# Patient Record
Sex: Female | Born: 1935 | Race: White | Hispanic: No | Marital: Married | Smoking: Former smoker
Health system: Southern US, Community
[De-identification: ages and names within clinical notes are randomized; demographics above are authoritative.]

## PROBLEM LIST (undated history)

## (undated) DIAGNOSIS — R51 Headache: Secondary | ICD-10-CM

## (undated) DIAGNOSIS — N6310 Unspecified lump in the right breast, unspecified quadrant: Secondary | ICD-10-CM

## (undated) DIAGNOSIS — Z923 Personal history of irradiation: Secondary | ICD-10-CM

## (undated) DIAGNOSIS — J189 Pneumonia, unspecified organism: Secondary | ICD-10-CM

## (undated) DIAGNOSIS — R0600 Dyspnea, unspecified: Secondary | ICD-10-CM

## (undated) DIAGNOSIS — M1990 Unspecified osteoarthritis, unspecified site: Secondary | ICD-10-CM

## (undated) DIAGNOSIS — C50919 Malignant neoplasm of unspecified site of unspecified female breast: Secondary | ICD-10-CM

## (undated) DIAGNOSIS — C801 Malignant (primary) neoplasm, unspecified: Secondary | ICD-10-CM

## (undated) DIAGNOSIS — T4145XA Adverse effect of unspecified anesthetic, initial encounter: Secondary | ICD-10-CM

## (undated) DIAGNOSIS — E119 Type 2 diabetes mellitus without complications: Secondary | ICD-10-CM

## (undated) DIAGNOSIS — I639 Cerebral infarction, unspecified: Secondary | ICD-10-CM

## (undated) DIAGNOSIS — H3530 Unspecified macular degeneration: Secondary | ICD-10-CM

## (undated) DIAGNOSIS — I5189 Other ill-defined heart diseases: Secondary | ICD-10-CM

## (undated) DIAGNOSIS — E785 Hyperlipidemia, unspecified: Secondary | ICD-10-CM

## (undated) DIAGNOSIS — I2510 Atherosclerotic heart disease of native coronary artery without angina pectoris: Principal | ICD-10-CM

## (undated) DIAGNOSIS — Z853 Personal history of malignant neoplasm of breast: Secondary | ICD-10-CM

## (undated) DIAGNOSIS — E079 Disorder of thyroid, unspecified: Secondary | ICD-10-CM

## (undated) DIAGNOSIS — T8859XA Other complications of anesthesia, initial encounter: Secondary | ICD-10-CM

## (undated) DIAGNOSIS — N184 Chronic kidney disease, stage 4 (severe): Secondary | ICD-10-CM

## (undated) DIAGNOSIS — Z9889 Other specified postprocedural states: Secondary | ICD-10-CM

## (undated) DIAGNOSIS — I209 Angina pectoris, unspecified: Secondary | ICD-10-CM

## (undated) DIAGNOSIS — G629 Polyneuropathy, unspecified: Secondary | ICD-10-CM

## (undated) DIAGNOSIS — K219 Gastro-esophageal reflux disease without esophagitis: Secondary | ICD-10-CM

## (undated) DIAGNOSIS — R112 Nausea with vomiting, unspecified: Secondary | ICD-10-CM

## (undated) DIAGNOSIS — K2970 Gastritis, unspecified, without bleeding: Secondary | ICD-10-CM

## (undated) DIAGNOSIS — I10 Essential (primary) hypertension: Secondary | ICD-10-CM

## (undated) DIAGNOSIS — Z951 Presence of aortocoronary bypass graft: Secondary | ICD-10-CM

## (undated) DIAGNOSIS — E039 Hypothyroidism, unspecified: Secondary | ICD-10-CM

## (undated) DIAGNOSIS — Z889 Allergy status to unspecified drugs, medicaments and biological substances status: Secondary | ICD-10-CM

## (undated) DIAGNOSIS — N186 End stage renal disease: Secondary | ICD-10-CM

## (undated) DIAGNOSIS — R42 Dizziness and giddiness: Secondary | ICD-10-CM

## (undated) DIAGNOSIS — N189 Chronic kidney disease, unspecified: Secondary | ICD-10-CM

## (undated) DIAGNOSIS — R0609 Other forms of dyspnea: Secondary | ICD-10-CM

## (undated) HISTORY — DX: Essential (primary) hypertension: I10

## (undated) HISTORY — PX: LAPAROSCOPIC NISSEN FUNDOPLICATION: SHX1932

## (undated) HISTORY — DX: Allergy status to unspecified drugs, medicaments and biological substances: Z88.9

## (undated) HISTORY — DX: Dyspnea, unspecified: R06.00

## (undated) HISTORY — DX: Unspecified macular degeneration: H35.30

## (undated) HISTORY — DX: Cerebral infarction, unspecified: I63.9

## (undated) HISTORY — DX: Other ill-defined heart diseases: I51.89

## (undated) HISTORY — DX: Personal history of malignant neoplasm of breast: Z85.3

## (undated) HISTORY — DX: Unspecified lump in the right breast, unspecified quadrant: N63.10

## (undated) HISTORY — PX: KNEE SURGERY: SHX244

## (undated) HISTORY — DX: Disorder of thyroid, unspecified: E07.9

## (undated) HISTORY — DX: Unspecified osteoarthritis, unspecified site: M19.90

## (undated) HISTORY — DX: Angina pectoris, unspecified: I20.9

## (undated) HISTORY — PX: OVARIAN CYST REMOVAL: SHX89

## (undated) HISTORY — PX: ABDOMINAL HYSTERECTOMY: SHX81

## (undated) HISTORY — DX: Gastro-esophageal reflux disease without esophagitis: K21.9

## (undated) HISTORY — DX: Hyperlipidemia, unspecified: E78.5

## (undated) HISTORY — DX: Other forms of dyspnea: R06.09

## (undated) HISTORY — PX: BREAST SURGERY: SHX581

## (undated) HISTORY — PX: APPENDECTOMY: SHX54

## (undated) HISTORY — DX: Dizziness and giddiness: R42

## (undated) HISTORY — DX: End stage renal disease: N18.6

## (undated) HISTORY — DX: Type 2 diabetes mellitus without complications: E11.9

## (undated) HISTORY — PX: BACK SURGERY: SHX140

## (undated) HISTORY — PX: BREAST BIOPSY: SHX20

## (undated) HISTORY — DX: Atherosclerotic heart disease of native coronary artery without angina pectoris: I25.10

---
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
## 2004-12-05 ENCOUNTER — Ambulatory Visit: Payer: Self-pay | Admitting: Internal Medicine

## 2005-05-31 ENCOUNTER — Ambulatory Visit: Payer: Self-pay | Admitting: Unknown Physician Specialty

## 2005-12-14 ENCOUNTER — Ambulatory Visit: Payer: Self-pay | Admitting: Internal Medicine

## 2007-02-19 ENCOUNTER — Ambulatory Visit: Payer: Self-pay | Admitting: Internal Medicine

## 2007-07-16 ENCOUNTER — Ambulatory Visit: Payer: Self-pay | Admitting: Internal Medicine

## 2008-03-24 ENCOUNTER — Ambulatory Visit: Payer: Self-pay | Admitting: Internal Medicine

## 2009-03-25 ENCOUNTER — Ambulatory Visit: Payer: Self-pay | Admitting: Internal Medicine

## 2010-04-05 ENCOUNTER — Ambulatory Visit: Payer: Self-pay | Admitting: Internal Medicine

## 2010-09-29 ENCOUNTER — Ambulatory Visit: Payer: Self-pay | Admitting: Unknown Physician Specialty

## 2010-10-02 DIAGNOSIS — C50919 Malignant neoplasm of unspecified site of unspecified female breast: Secondary | ICD-10-CM

## 2010-10-02 HISTORY — PX: BREAST LUMPECTOMY: SHX2

## 2010-10-02 HISTORY — DX: Malignant neoplasm of unspecified site of unspecified female breast: C50.919

## 2011-06-26 ENCOUNTER — Ambulatory Visit: Payer: Self-pay | Admitting: Internal Medicine

## 2011-06-28 ENCOUNTER — Ambulatory Visit: Payer: Self-pay | Admitting: Internal Medicine

## 2011-07-25 ENCOUNTER — Ambulatory Visit: Payer: Self-pay | Admitting: Surgery

## 2011-08-02 LAB — PATHOLOGY REPORT

## 2011-08-10 ENCOUNTER — Ambulatory Visit: Payer: Self-pay | Admitting: Surgery

## 2011-08-15 LAB — PATHOLOGY REPORT

## 2011-08-18 ENCOUNTER — Ambulatory Visit: Payer: Self-pay | Admitting: Internal Medicine

## 2011-08-19 LAB — CANCER ANTIGEN 27.29: CA 27.29: 16.1 U/mL (ref 0.0–38.6)

## 2011-09-02 ENCOUNTER — Ambulatory Visit: Payer: Self-pay | Admitting: Internal Medicine

## 2011-10-03 ENCOUNTER — Ambulatory Visit: Payer: Self-pay | Admitting: Internal Medicine

## 2011-10-26 LAB — CBC CANCER CENTER
Basophil #: 0.1 x10 3/mm (ref 0.0–0.1)
Basophil %: 0.9 %
Eosinophil #: 0.2 x10 3/mm (ref 0.0–0.7)
Eosinophil %: 3.2 %
HCT: 45.6 % (ref 35.0–47.0)
HGB: 15.5 g/dL (ref 12.0–16.0)
Lymphocyte #: 1.6 x10 3/mm (ref 1.0–3.6)
Lymphocyte %: 22.3 %
MCH: 29.4 pg (ref 26.0–34.0)
MCHC: 34 g/dL (ref 32.0–36.0)
MCV: 86 fL (ref 80–100)
Monocyte #: 0.5 x10 3/mm (ref 0.0–0.7)
Monocyte %: 6.2 %
Neutrophil #: 4.9 x10 3/mm (ref 1.4–6.5)
Neutrophil %: 67.4 %
Platelet: 274 x10 3/mm (ref 150–440)
RBC: 5.28 10*6/uL — ABNORMAL HIGH (ref 3.80–5.20)
RDW: 14.4 % (ref 11.5–14.5)
WBC: 7.3 x10 3/mm (ref 3.6–11.0)

## 2011-11-02 LAB — CBC CANCER CENTER
Basophil #: 0.1 x10 3/mm (ref 0.0–0.1)
Basophil %: 0.9 %
Eosinophil #: 0.3 x10 3/mm (ref 0.0–0.7)
Eosinophil %: 3.9 %
HCT: 43.9 % (ref 35.0–47.0)
HGB: 15 g/dL (ref 12.0–16.0)
Lymphocyte #: 2 x10 3/mm (ref 1.0–3.6)
Lymphocyte %: 27 %
MCH: 29.6 pg (ref 26.0–34.0)
MCHC: 34.1 g/dL (ref 32.0–36.0)
MCV: 87 fL (ref 80–100)
Monocyte #: 0.5 x10 3/mm (ref 0.0–0.7)
Monocyte %: 6.5 %
Neutrophil #: 4.6 x10 3/mm (ref 1.4–6.5)
Neutrophil %: 61.7 %
Platelet: 250 x10 3/mm (ref 150–440)
RBC: 5.06 10*6/uL (ref 3.80–5.20)
RDW: 14.4 % (ref 11.5–14.5)
WBC: 7.5 x10 3/mm (ref 3.6–11.0)

## 2011-11-03 ENCOUNTER — Ambulatory Visit: Payer: Self-pay | Admitting: Internal Medicine

## 2011-11-09 LAB — CBC CANCER CENTER
Basophil #: 0.1 x10 3/mm (ref 0.0–0.1)
Basophil %: 0.6 %
Eosinophil #: 0.3 x10 3/mm (ref 0.0–0.7)
Eosinophil %: 3.5 %
HCT: 44.3 % (ref 35.0–47.0)
HGB: 15.1 g/dL (ref 12.0–16.0)
Lymphocyte #: 1.7 x10 3/mm (ref 1.0–3.6)
Lymphocyte %: 22 %
MCH: 29.7 pg (ref 26.0–34.0)
MCHC: 34.1 g/dL (ref 32.0–36.0)
MCV: 87 fL (ref 80–100)
Monocyte #: 0.6 x10 3/mm (ref 0.0–0.7)
Monocyte %: 7.8 %
Neutrophil #: 5.2 x10 3/mm (ref 1.4–6.5)
Neutrophil %: 66.1 %
Platelet: 225 x10 3/mm (ref 150–440)
RBC: 5.09 10*6/uL (ref 3.80–5.20)
RDW: 14.5 % (ref 11.5–14.5)
WBC: 7.9 x10 3/mm (ref 3.6–11.0)

## 2011-11-16 LAB — CBC CANCER CENTER
Basophil #: 0.1 x10 3/mm (ref 0.0–0.1)
Basophil %: 1.2 %
Eosinophil #: 0.2 x10 3/mm (ref 0.0–0.7)
Eosinophil %: 3 %
HCT: 43.6 % (ref 35.0–47.0)
HGB: 14.8 g/dL (ref 12.0–16.0)
Lymphocyte #: 1.1 x10 3/mm (ref 1.0–3.6)
Lymphocyte %: 17.7 %
MCH: 29.4 pg (ref 26.0–34.0)
MCHC: 33.8 g/dL (ref 32.0–36.0)
MCV: 87 fL (ref 80–100)
Monocyte #: 0.4 x10 3/mm (ref 0.0–0.7)
Monocyte %: 7.1 %
Neutrophil #: 4.5 x10 3/mm (ref 1.4–6.5)
Neutrophil %: 71 %
Platelet: 222 x10 3/mm (ref 150–440)
RBC: 5.02 10*6/uL (ref 3.80–5.20)
RDW: 14.7 % — ABNORMAL HIGH (ref 11.5–14.5)
WBC: 6.3 x10 3/mm (ref 3.6–11.0)

## 2011-11-23 LAB — CBC CANCER CENTER
Basophil #: 0 x10 3/mm (ref 0.0–0.1)
Basophil %: 0.6 %
Eosinophil #: 0.2 x10 3/mm (ref 0.0–0.7)
Eosinophil %: 3.3 %
HCT: 44.4 % (ref 35.0–47.0)
HGB: 15 g/dL (ref 12.0–16.0)
Lymphocyte #: 1 x10 3/mm (ref 1.0–3.6)
Lymphocyte %: 17.3 %
MCH: 29.5 pg (ref 26.0–34.0)
MCHC: 33.8 g/dL (ref 32.0–36.0)
MCV: 87 fL (ref 80–100)
Monocyte #: 0.3 x10 3/mm (ref 0.0–0.7)
Monocyte %: 6.1 %
Neutrophil #: 4 x10 3/mm (ref 1.4–6.5)
Neutrophil %: 72.7 %
Platelet: 196 x10 3/mm (ref 150–440)
RBC: 5.08 10*6/uL (ref 3.80–5.20)
RDW: 14.9 % — ABNORMAL HIGH (ref 11.5–14.5)
WBC: 5.6 x10 3/mm (ref 3.6–11.0)

## 2011-11-30 LAB — CBC CANCER CENTER
Basophil #: 0 x10 3/mm (ref 0.0–0.1)
Basophil %: 0.8 %
Eosinophil #: 0.2 x10 3/mm (ref 0.0–0.7)
Eosinophil %: 2.9 %
HCT: 44.4 % (ref 35.0–47.0)
HGB: 15.1 g/dL (ref 12.0–16.0)
Lymphocyte #: 0.9 x10 3/mm — ABNORMAL LOW (ref 1.0–3.6)
Lymphocyte %: 16 %
MCH: 29.6 pg (ref 26.0–34.0)
MCHC: 34 g/dL (ref 32.0–36.0)
MCV: 87 fL (ref 80–100)
Monocyte #: 0.4 x10 3/mm (ref 0.0–0.7)
Monocyte %: 6.6 %
Neutrophil #: 4.4 x10 3/mm (ref 1.4–6.5)
Neutrophil %: 73.7 %
Platelet: 194 x10 3/mm (ref 150–440)
RBC: 5.1 10*6/uL (ref 3.80–5.20)
RDW: 14.8 % — ABNORMAL HIGH (ref 11.5–14.5)
WBC: 5.9 x10 3/mm (ref 3.6–11.0)

## 2011-12-01 ENCOUNTER — Ambulatory Visit: Payer: Self-pay | Admitting: Internal Medicine

## 2011-12-12 LAB — COMPREHENSIVE METABOLIC PANEL
Albumin: 3.3 g/dL — ABNORMAL LOW (ref 3.4–5.0)
Alkaline Phosphatase: 108 U/L (ref 50–136)
Anion Gap: 9 (ref 7–16)
BUN: 18 mg/dL (ref 7–18)
Bilirubin,Total: 0.2 mg/dL (ref 0.2–1.0)
Calcium, Total: 8.5 mg/dL (ref 8.5–10.1)
Chloride: 100 mmol/L (ref 98–107)
Co2: 28 mmol/L (ref 21–32)
Creatinine: 1.08 mg/dL (ref 0.60–1.30)
EGFR (African American): >60
EGFR (Non-African Amer.): 53 — ABNORMAL LOW
Glucose: 191 mg/dL — ABNORMAL HIGH (ref 65–99)
Osmolality: 281 (ref 275–301)
Potassium: 4.1 mmol/L (ref 3.5–5.1)
SGOT(AST): 12 U/L — ABNORMAL LOW (ref 15–37)
SGPT (ALT): 20 U/L
Sodium: 137 mmol/L (ref 136–145)
Total Protein: 7.9 g/dL (ref 6.4–8.2)

## 2011-12-12 LAB — CBC CANCER CENTER
Basophil #: 0 x10 3/mm (ref 0.0–0.1)
Basophil %: 0.3 %
Eosinophil #: 0.2 x10 3/mm (ref 0.0–0.7)
Eosinophil %: 2.5 %
HCT: 41.4 % (ref 35.0–47.0)
HGB: 14.4 g/dL (ref 12.0–16.0)
Lymphocyte #: 1 x10 3/mm (ref 1.0–3.6)
Lymphocyte %: 11.1 %
MCH: 30.2 pg (ref 26.0–34.0)
MCHC: 34.8 g/dL (ref 32.0–36.0)
MCV: 87 fL (ref 80–100)
Monocyte #: 0.6 x10 3/mm (ref 0.0–0.7)
Monocyte %: 6.3 %
Neutrophil #: 7.2 x10 3/mm — ABNORMAL HIGH (ref 1.4–6.5)
Neutrophil %: 79.8 %
Platelet: 239 x10 3/mm (ref 150–440)
RBC: 4.77 10*6/uL (ref 3.80–5.20)
RDW: 14.9 % — ABNORMAL HIGH (ref 11.5–14.5)
WBC: 9 x10 3/mm (ref 3.6–11.0)

## 2011-12-13 LAB — CANCER ANTIGEN 27.29: CA 27.29: 18.5 U/mL (ref 0.0–38.6)

## 2012-01-01 ENCOUNTER — Ambulatory Visit: Payer: Self-pay | Admitting: Internal Medicine

## 2012-01-31 ENCOUNTER — Ambulatory Visit: Payer: Self-pay | Admitting: Internal Medicine

## 2012-03-14 ENCOUNTER — Ambulatory Visit: Payer: Self-pay | Admitting: Oncology

## 2012-04-01 ENCOUNTER — Ambulatory Visit: Payer: Self-pay | Admitting: Oncology

## 2012-05-02 ENCOUNTER — Ambulatory Visit: Payer: Self-pay | Admitting: Oncology

## 2012-06-20 ENCOUNTER — Ambulatory Visit: Payer: Self-pay | Admitting: Oncology

## 2012-06-27 ENCOUNTER — Ambulatory Visit: Payer: Self-pay | Admitting: Internal Medicine

## 2012-07-02 ENCOUNTER — Ambulatory Visit: Payer: Self-pay | Admitting: Oncology

## 2012-08-19 ENCOUNTER — Ambulatory Visit (INDEPENDENT_AMBULATORY_CARE_PROVIDER_SITE_OTHER): Payer: Medicare Other | Admitting: Cardiovascular Disease

## 2012-08-19 ENCOUNTER — Encounter: Payer: Self-pay | Admitting: Cardiovascular Disease

## 2012-08-19 VITALS — BP 140/80 | HR 74 | Ht 65.0 in | Wt 220.8 lb

## 2012-08-19 DIAGNOSIS — R0602 Shortness of breath: Secondary | ICD-10-CM

## 2012-08-19 DIAGNOSIS — E785 Hyperlipidemia, unspecified: Secondary | ICD-10-CM

## 2012-08-19 DIAGNOSIS — I10 Essential (primary) hypertension: Secondary | ICD-10-CM

## 2012-08-19 DIAGNOSIS — K219 Gastro-esophageal reflux disease without esophagitis: Secondary | ICD-10-CM | POA: Insufficient documentation

## 2012-08-19 DIAGNOSIS — E119 Type 2 diabetes mellitus without complications: Secondary | ICD-10-CM

## 2012-08-19 DIAGNOSIS — C50919 Malignant neoplasm of unspecified site of unspecified female breast: Secondary | ICD-10-CM

## 2012-08-19 DIAGNOSIS — R070 Pain in throat: Secondary | ICD-10-CM

## 2012-08-19 MED ORDER — PREDNISONE 20 MG PO TABS: ORAL_TABLET | ORAL | Status: DC

## 2012-08-19 MED ORDER — NITROGLYCERIN 0.4 MG SL SUBL: 0.4000 mg | SUBLINGUAL_TABLET | SUBLINGUAL | Status: DC | PRN

## 2012-08-19 NOTE — Assessment & Plan Note (Signed)
Previous radiation on the right. No recurrence per the patient

## 2012-08-19 NOTE — Assessment & Plan Note (Addendum)
Exertional throat pain, getting worse over the past year. Very concerning for angina also in light of her risk factors including poorly controlled diabetes and hyperlipidemia. She has strong family history of bypass surgery/coronary artery disease.   We have discussed the various options with her including medical management, noninvasive stress testing, cardiac catheterization. After discussion with her and her husband, she would like to proceed with cardiac catheterization. We have discussed the risk and benefit of the procedure including heart attack, stroke, death. She is willing to proceed with the procedure and this will be scheduled later this week.  We will prescribe nitroglycerin for her to take sublingual if she has recurrent pain

## 2012-08-19 NOTE — Assessment & Plan Note (Signed)
Mild shortness of breath with exertion. Uncertain if this is cardiac etiology or deconditioning. Cardiac cath pending.

## 2012-08-19 NOTE — Progress Notes (Signed)
Patient ID: [name], female    DOB: 1934/09/20, 78 y.o.   MRN: [id number]  HPI Comments: [name] is a very pleasant 79 year old woman with history of hyperlipidemia, diabetes, previous esophageal surgery, breast cancer, remote smoking history who quit in the 1990s, radiation on the right and 39 treatments with no chemotherapy, hypertension who presents by referral from Dr. Tami Ribas for throat tightness with exertion. She is a patient of Dr. Georgie Chard.  She reports that for over one year, she has had worsening throat tightness with exertion. She has symptoms of throat pain when she pushed mows, does any housework that is strenuous. If she sits down, her symptoms will recover within 15-20 minutes. She occasionally has symptoms at rest though typically with exertion. When she exerts herself, she feels very hot. She denies any problems with swallowing when she has these symptoms. She has not tried nitroglycerin for her symptoms. She denies any lightheadedness, dizziness, edema. Typically with exertion, she'll have to sit down secondary to her symptoms. No radiating pain to her arm or back.  She reports being unable to take a cholesterol pill. Total cholesterol 244 Hemoglobin A1c 7.8 She reports having evaluation by Dr. Tami Ribas with no ear nose throat etiology of her throat pain. She does report having reaction to dye  EKG shows normal sinus rhythm with rate 73 beats per minute, poor R-wave progression through the precordial leads, unable to exclude old anterior infarct, left axis deviation   Outpatient Encounter Prescriptions as of 08/19/2012  Medication Sig Dispense Refill  . aspirin 81 MG tablet Take 81 mg by mouth daily.      . cloNIDine (CATAPRES) 0.2 MG tablet Take 0.2 mg by mouth 2 (two) times daily.      Marland Kitchen letrozole (FEMARA) 2.5 MG tablet Take 2.5 mg by mouth daily.      Marland Kitchen levothyroxine (SYNTHROID, LEVOTHROID) 125 MCG tablet Take 125 mcg by mouth daily.      . nitroGLYCERIN  (NITROSTAT) 0.4 MG SL tablet Place 1 tablet (0.4 mg total) under the tongue every 5 (five) minutes as needed for chest pain.  25 tablet  3    Review of Systems  Constitutional: Negative.   HENT: Negative.   Eyes: Negative.   Respiratory: Negative.   Cardiovascular: Positive for chest pain.       Throat tightness with exertion  Gastrointestinal: Negative.   Musculoskeletal: Negative.   Skin: Negative.   Neurological: Negative.   Hematological: Negative.   Psychiatric/Behavioral: Negative.   All other systems reviewed and are negative.    BP 140/80  Pulse 74  Ht 5\' 5"  (1.651 m)  Wt 220 lb 12 oz (100.132 kg)  BMI 36.73 kg/m2  Physical Exam  Nursing note and vitals reviewed. Constitutional: She is oriented to person, place, and time. She appears well-developed and well-nourished.       Obese  HENT:  Head: Normocephalic.  Nose: Nose normal.  Mouth/Throat: Oropharynx is clear and moist.  Eyes: Conjunctivae normal are normal. Pupils are equal, round, and reactive to light.  Neck: Normal range of motion. Neck supple. No JVD present.  Cardiovascular: Normal rate, regular rhythm, S1 normal, S2 normal, normal heart sounds and intact distal pulses.  Exam reveals no gallop and no friction rub.   No murmur heard. Pulmonary/Chest: Effort normal and breath sounds normal. No respiratory distress. She has no wheezes. She has no rales. She exhibits no tenderness.  Abdominal: Soft. Bowel sounds are normal. She exhibits no distension. There is  no tenderness.  Musculoskeletal: Normal range of motion. She exhibits no edema and no tenderness.  Lymphadenopathy:    She has no cervical adenopathy.  Neurological: She is alert and oriented to person, place, and time. Coordination normal.  Skin: Skin is warm and dry. No rash noted. No erythema.  Psychiatric: She has a normal mood and affect. Her behavior is normal. Judgment and thought content normal.         Assessment and Plan

## 2012-08-19 NOTE — Assessment & Plan Note (Signed)
Blood pressure is well controlled on today's visit. No changes made to the medications. 

## 2012-08-19 NOTE — Patient Instructions (Addendum)
We will schedule you for a cardiac cath on Thursday at 8 AM We will check kidney function and chest Xray today in preparation  No food the morning of the procedure Please take your medications the morning of the procedure  Please call us if you have new issues that need to be addressed before your next appt.

## 2012-08-19 NOTE — Assessment & Plan Note (Signed)
We have encouraged continued exercise, careful diet management in an effort to lose weight. 

## 2012-08-19 NOTE — Assessment & Plan Note (Signed)
Total cholesterol 244. She does not want to take a cholesterol medication. She has previously tried red yeast rice with myalgias

## 2012-08-20 ENCOUNTER — Telehealth: Payer: Self-pay

## 2012-08-20 LAB — BASIC METABOLIC PANEL
BUN/Creatinine Ratio: 16 (ref 11–26)
BUN: 14 mg/dL (ref 8–27)
CO2: 24 mmol/L (ref 19–28)
Calcium: 9.2 mg/dL (ref 8.6–10.2)
Chloride: 98 mmol/L (ref 97–108)
Creatinine, Ser: 0.89 mg/dL (ref 0.57–1.00)
GFR calc Af Amer: 73 mL/min/{1.73_m2} (ref >59)
GFR calc non Af Amer: 63 mL/min/{1.73_m2} (ref >59)
Glucose: 145 mg/dL — ABNORMAL HIGH (ref 65–99)
Potassium: 4.6 mmol/L (ref 3.5–5.2)
Sodium: 136 mmol/L (ref 134–144)

## 2012-08-20 LAB — PROTIME-INR
INR: 1 (ref 0.8–1.2)
Prothrombin Time: 10.2 s (ref 9.1–12.0)

## 2012-08-20 LAB — CBC WITH DIFFERENTIAL
Basophils Absolute: 0.1 10*3/uL (ref 0.0–0.2)
Basos: 1 % (ref 0–3)
Eos: 3 % (ref 0–5)
Eosinophils Absolute: 0.2 10*3/uL (ref 0.0–0.4)
HCT: 40.7 % (ref 34.0–46.6)
Hemoglobin: 14.5 g/dL (ref 11.1–15.9)
Immature Grans (Abs): 0 10*3/uL (ref 0.0–0.1)
Immature Granulocytes: 0 % (ref 0–2)
Lymphocytes Absolute: 2 10*3/uL (ref 0.7–3.1)
Lymphs: 33 % (ref 14–46)
MCH: 30.8 pg (ref 26.6–33.0)
MCHC: 35.6 g/dL (ref 31.5–35.7)
MCV: 86 fL (ref 79–97)
Monocytes Absolute: 0.4 10*3/uL (ref 0.1–0.9)
Monocytes: 7 % (ref 4–12)
Neutrophils Absolute: 3.5 10*3/uL (ref 1.4–7.0)
Neutrophils Relative %: 56 % (ref 40–74)
Platelets: 198 10*3/uL (ref 155–379)
RBC: 4.71 x10E6/uL (ref 3.77–5.28)
RDW: 14.6 % (ref 12.3–15.4)
WBC: 6.2 10*3/uL (ref 3.4–10.8)

## 2012-08-20 MED ORDER — PREDNISONE 20 MG PO TABS: ORAL_TABLET | ORAL | Status: DC

## 2012-08-20 NOTE — Telephone Encounter (Signed)
Resent Prednisone due to Rx defaulting to print when sent in on 08/19/2012.

## 2012-08-20 NOTE — Telephone Encounter (Signed)
Will have Dr. Rockey Situ review and address

## 2012-08-20 NOTE — Telephone Encounter (Signed)
[id number] Canary Brim         ----- Message -----   From: Lillia Pauls   Sent: 08/20/2012 10:01 AM   To: Minna Merritts, MD, Minus Liberty, RN   Subject: LHC       Dr. Rockey Situ,      LHC was denied by [facility] MD. I faxed all clinicals prior to their denial. I have not resubmitted anything additional for a "Restart".       Reason for denial:      Test has been requested to look for a possible narrowing in the arteries to the heart (CAD) causing heart related symptoms such as chest pain. No prior tests have shown that there is a narrowing of the arteries to the heart. Based on the description of the current symptoms, age and sex there is a low to intermediate likelihood of having a problem with the arteries to the heart (CAD). No "imaging stress test" (pictures of the heart after walking on a treadmill) has been done. This test would be needed if narrowing of the heart arteries was shown on an "imaging stress test". This test would be needed if based on the description of the current symptoms, age, and sex there is a high likelihood of having a problem with the arteries to the heart (CAD).      They may let you call and discuss without submitting any additional info and doing a "RESTART". Let me know what you need me to do.      Denied [id number]  - Please call [phone] Opt#[id number]      Thanks,      Charmaine               Forwarded by:     Lillia Pauls Date: 08/20/2012

## 2012-08-21 ENCOUNTER — Telehealth: Payer: Self-pay

## 2012-08-21 NOTE — Telephone Encounter (Signed)
LHC APPROVED.

## 2012-08-21 NOTE — Telephone Encounter (Signed)
Dr. Rockey Situ says he will call insur company # provided for "peer-peer" review We will call pt with decision

## 2012-08-21 NOTE — Telephone Encounter (Signed)
Dr. Rockey Situ discussed case with Dr. Tenna Delaine at [facility] peer review Dr. Tenna Delaine informed Dr. Rockey Situ she will send appeal request and we should be receiving a fax within 1-2 houirs with prior auth # attached precert team made aware in [place] Dr. Rockey Situ asked if we should postpone cath and was told by Dr. Tenna Delaine to keep as scheduled Pt informed Understanding verb

## 2012-08-21 NOTE — Telephone Encounter (Signed)
I discussed with Dr. Rockey Situ the issue of pt's insurance denying coverage for [facility] He asks that I call pt to tell her we are having to perform a stress test prior to heart cath, per insurance  I called pt who says she would like Dr. Rockey Situ to try to "fight" this and call her back as soon as we have an answer I will discuss again with Dr. Rockey Situ and will have him call # provided

## 2012-08-22 ENCOUNTER — Ambulatory Visit: Payer: Self-pay | Admitting: Cardiovascular Disease

## 2012-08-22 ENCOUNTER — Telehealth: Payer: Self-pay

## 2012-08-22 DIAGNOSIS — I2510 Atherosclerotic heart disease of native coronary artery without angina pectoris: Secondary | ICD-10-CM

## 2012-08-22 HISTORY — DX: Atherosclerotic heart disease of native coronary artery without angina pectoris: I25.10

## 2012-08-22 NOTE — Telephone Encounter (Signed)
"  Refer pt to [facility] to discuss possible CABG" VO Dr. Mills Koller, RN Referral form faxed to [place]

## 2012-08-22 NOTE — Telephone Encounter (Signed)
Pt informed that I have faxed referral to [facility] and am awaiting appt Understanding verb I will call [facility] back at 4 pm to see if appt has been made

## 2012-08-22 NOTE — Telephone Encounter (Signed)
appt made with Dr. Roxy Manns at [place] 08/26/12 at 1000 Pt informed Understanding verb

## 2012-08-23 ENCOUNTER — Other Ambulatory Visit: Payer: Self-pay | Admitting: Cardiovascular Disease

## 2012-08-23 MED ORDER — METOPROLOL TARTRATE 25 MG PO TABS: 25.0000 mg | ORAL_TABLET | Freq: Two times a day (BID) | ORAL | Status: DC

## 2012-08-26 ENCOUNTER — Other Ambulatory Visit: Payer: Self-pay | Admitting: Thoracic Surgery (Cardiothoracic Vascular Surgery)

## 2012-08-26 ENCOUNTER — Institutional Professional Consult (permissible substitution) (INDEPENDENT_AMBULATORY_CARE_PROVIDER_SITE_OTHER): Payer: Medicare Other | Admitting: Thoracic Surgery (Cardiothoracic Vascular Surgery)

## 2012-08-26 ENCOUNTER — Other Ambulatory Visit: Payer: Self-pay | Admitting: *Deleted

## 2012-08-26 ENCOUNTER — Encounter: Payer: Self-pay | Admitting: Thoracic Surgery (Cardiothoracic Vascular Surgery)

## 2012-08-26 VITALS — BP 178/89 | HR 77 | Resp 20 | Ht 65.0 in | Wt 220.0 lb

## 2012-08-26 DIAGNOSIS — I2510 Atherosclerotic heart disease of native coronary artery without angina pectoris: Secondary | ICD-10-CM

## 2012-08-26 DIAGNOSIS — I209 Angina pectoris, unspecified: Secondary | ICD-10-CM

## 2012-08-26 HISTORY — DX: Angina pectoris, unspecified: I20.9

## 2012-08-26 NOTE — Patient Instructions (Signed)
Use nitroglycerin tablets as directed for any chest pain.  Avoid all strenuous or stressful activity.  Report any significant changes to our office.

## 2012-08-26 NOTE — Progress Notes (Signed)
[address]            [place] [address]          [phone]     CARDIOTHORACIC SURGERY CONSULTATION REPORT  Referring Provider is Rockey Situ, Kathlene November, MD PCP is Idelle Crouch, MD  Chief Complaint  Patient presents with  . Coronary Artery Disease    Referral from Dr Rockey Situ for surgical eval on CAD, Cardiac Cath 08/22/12    HPI:  Patient is a 78 year old female from [place] no previous history of coronary artery disease but risk factors notable for history of hypertension, type 2 diabetes mellitus, hyperlipidemia, a family history of coronary artery disease and a remote history of tobacco use. The patient describes a greater than one-year history of tightness in her throat which is brought on with physical activity and relieved by rest. She states that she first experienced these symptoms more than a year ago and typically occur when the patient is moaning her longer doing something reasonably strenuous. 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 She did not undergo adjuvant chemotherapy.  During that time she was less active physically and she states that she hasn't really noticed her problems with tightness in her throat. However, this past summer she seemed to get her energy back in with added physical activity she began to experience similar symptoms but also became associated with exertional shortness of breath. He was seen by her otolaryngologist and subsequently referred to Dr. Rockey Situ because of concerns that her symptoms might represent angina pectoris. Over the past few months her symptoms have seemed to get some worsening she occasionally gets mild tightness in her throat using when she has not physically active, although for the most part her symptoms occur only with physical activity. Symptoms are usually relieved within 15-20 minutes of  rest, and she has never had any particularly prolonged episodes of discomfort. She has not had any pain in her chest, arm, nor back.  She underwent cardiac catheterization last week and was found to have 70% stenosis of the left main coronary artery with severe three-vessel coronary artery disease and preserved left ventricular function. The patient has now been referred for possible elective surgical revascularization.  Past Medical History  Diagnosis Date  . Breast mass, right   . Vertigo   . Hypertension   . Thyroid disease   . History of breast cancer     39 treatments of radiation. Negative chemo.  . Diabetes   . GERD (gastroesophageal reflux disease)   . Hyperlipemia   . History of seasonal allergies   . Arthritis   . Coronary artery disease 08/22/2012  . Angina pectoris 08/26/2012    Past Surgical History  Procedure Date  . Abdominal hysterectomy   . Ovarian cyst removal   . Back surgery   . Appendectomy   . Breast surgery   . Laparoscopic nissen fundoplication     Family History  Problem Relation Age of Onset  . Heart disease Brother     CABG & stents  . Hyperlipidemia Brother   . Hypertension Brother   . Cancer Father   . Heart disease Mother     History   Social History  . Marital Status: Married    Spouse Name: N/A    Number of Children: 0  .  Years of Education: N/A   Occupational History  . retired    Social History Main Topics  . Smoking status: Former Smoker    Types: Cigarettes    Quit date: 11/13/1988  . Smokeless tobacco: Not on file  . Alcohol Use: No  . Drug Use: No  . Sexually Active: Not on file   Other Topics Concern  . Not on file   Social History Narrative  . No narrative on file    Current Outpatient Prescriptions  Medication Sig Dispense Refill  . aspirin 325 MG EC tablet Take 325 mg by mouth daily.      . cloNIDine (CATAPRES) 0.2 MG tablet Take 0.2 mg by mouth 2 (two) times daily.      Marland Kitchen letrozole (FEMARA) 2.5 MG tablet  Take 2.5 mg by mouth daily.      Marland Kitchen levothyroxine (SYNTHROID, LEVOTHROID) 125 MCG tablet Take 125 mcg by mouth daily.      . metoprolol tartrate (LOPRESSOR) 25 MG tablet Take 1 tablet (25 mg total) by mouth 2 (two) times daily.  60 tablet  3  . nitroGLYCERIN (NITROSTAT) 0.4 MG SL tablet Place 1 tablet (0.4 mg total) under the tongue every 5 (five) minutes as needed for chest pain.  25 tablet  3  . predniSONE (DELTASONE) 20 MG tablet Take 3 tablets (60 mg) night before procedure Take 3 tablets (60 mg) morning of procedure  6 tablet  0    Allergies  Allergen Reactions  . Ciprocinonide (Fluocinolone)     Feels crazy  . Darvocet (Propoxyphene-Acetaminophen)     Difficulty breathing and swallowing  . Ivp Dye (Iodinated Diagnostic Agents)     Nausea & vomiting  . Statins     Muscle aches      Review of Systems:   General:  normal appetite, decreased energy, no weight gain, no weight loss, no fever  Cardiac:  no chest pain with exertion, no chest pain at rest, + SOB with moderate exertion, no resting SOB, no PND, no orthopnea, no palpitations, no arrhythmia, no atrial fibrillation, no LE edema, no dizzy spells, no syncope  Respiratory:  + exertional shortness of breath, no home oxygen, no productive cough, + dry cough, no bronchitis, no wheezing, no hemoptysis, no asthma, no pain with inspiration or cough, no sleep apnea, no CPAP at night  GI:   no difficulty swallowing, remote h/o reflux, no frequent heartburn, no hiatal hernia, no abdominal pain, no constipation, no diarrhea, no hematochezia, no hematemesis, no melena  GU:   no dysuria,  no frequency, no urinary tract infection, no hematuria, no kidney stones, no kidney disease  Vascular:  no pain suggestive of claudication, no pain in feet, no leg cramps, no varicose veins, no DVT, no non-healing foot ulcer  Neuro:   no stroke, no TIA's, no seizures, no headaches, no temporary blindness one eye,  no slurred speech, no peripheral neuropathy,  no chronic pain, no instability of gait, no memory/cognitive dysfunction  Musculoskeletal: + arthritis, no joint swelling, no myalgias, mild difficulty walking, normal mobility   Skin:   no rash, no itching, no skin infections, no pressure sores or ulcerations  Psych:   no anxiety, no depression, no nervousness, no unusual recent stress  Eyes:   no blurry vision, no floaters, no recent vision changes,  wears glasses or contacts  ENT:   no hearing loss, no loose or painful teeth, no dentures  Hematologic:  no easy bruising, no abnormal bleeding, no clotting disorder,  no frequent epistaxis  Endocrine:  + diabetes, does not check CBG's at home     Physical Exam:   BP 178/89  Pulse 77  Resp 20  Ht 5\' 5"  (1.651 m)  Wt 220 lb (99.791 kg)  BMI 36.61 kg/m2  SpO2 94%  General:  Obese but o/w  well-appearing  HEENT:  Unremarkable   Neck:   no JVD, no bruits, no adenopathy   Chest:   clear to auscultation, symmetrical breath sounds, no wheezes, no rhonchi   CV:   RRR, no murmur   Abdomen:  soft, non-tender, no masses   Extremities:  warm, well-perfused, pulses diminished, no LE edema  Rectal/GU  Deferred  Neuro:   Grossly non-focal and symmetrical throughout  Skin:   Clean and dry, no rashes, no breakdown   Diagnostic Tests:  Cardiac catheterization performed 08/22/2012 at [facility] is directly reviewed. The patient has right dominant coronary circulation with diffuse coronary artery disease suggestive of underlying diabetes mellitus. There is tubular 70% stenosis of the distal left main coronary artery.  There is 70-80% proximal stenosis of the left anterior ascending coronary artery. There is a medium to large size bifurcating diagonal branch that has diffuse disease is in both branches including 90% stenosis of the lateral subbranch. There is 90% ostial stenosis of left circumflex coronary artery. His 50% proximal stenosis of the right coronary artery with long segment  tubular 60-70% proximal stenosis of the posterior descending coronary artery. Left ventricular function appears normal with ejection fraction estimated greater than 60%.   Impression:  Left main disease with severe three-vessel coronary artery disease and preserved left ventricular systolic function. The patient presents with symptoms of exertional tightness in her throat consistent with angina pectoris. I agree that she would best be treated with surgical revascularization. Risks associated with surgery will be slightly elevated because of the patient's advanced age, obesity, and type 2 diabetes mellitus.   Plan:  I spent in axis of 60 minutes with the patient and her husband in the office today discussing her current condition and alternative treatment strategies for the future. We discussed at length the indications, risks, and potential benefits of coronary artery bypass grafting. The patient and her husband understand and accept all potential associated risks of surgery including but not limited to risk of death, stroke, myocardial infarction, congestive heart failure, respiratory failure, renal failure, pneumonia, bleeding requiring blood transfusion, arrhythmia, infection, and late recurrence of symptomatic coronary artery disease. All of her questions been addressed. We tentatively plan to proceed with surgery on December 5.    Valentina Gu. Roxy Manns, MD 08/26/2012 2:53 PM

## 2012-08-27 ENCOUNTER — Encounter (HOSPITAL_COMMUNITY): Payer: Self-pay | Admitting: Pharmacy Technician

## 2012-09-02 DIAGNOSIS — Z01810 Encounter for preprocedural cardiovascular examination: Secondary | ICD-10-CM

## 2012-09-03 ENCOUNTER — Ambulatory Visit (HOSPITAL_COMMUNITY)
Admission: RE | Admit: 2012-09-03 | Discharge: 2012-09-03 | Disposition: A | Discharge status: Home / Self Care | Payer: Medicare Other | Source: Ambulatory Visit | Attending: Thoracic Surgery (Cardiothoracic Vascular Surgery) | Admitting: Thoracic Surgery (Cardiothoracic Vascular Surgery)

## 2012-09-03 ENCOUNTER — Inpatient Hospital Stay (HOSPITAL_COMMUNITY)
Admission: RE | Admit: 2012-09-03 | Discharge: 2012-09-03 | Disposition: A | Discharge status: Home / Self Care | Payer: Medicare Other | Source: Ambulatory Visit | Attending: Thoracic Surgery (Cardiothoracic Vascular Surgery) | Admitting: Thoracic Surgery (Cardiothoracic Vascular Surgery)

## 2012-09-03 ENCOUNTER — Encounter (HOSPITAL_COMMUNITY): Payer: Self-pay

## 2012-09-03 ENCOUNTER — Encounter (HOSPITAL_COMMUNITY)
Admission: RE | Admit: 2012-09-03 | Discharge: 2012-09-03 | Disposition: A | Discharge status: Home / Self Care | Payer: Medicare Other | Source: Ambulatory Visit | Attending: Thoracic Surgery (Cardiothoracic Vascular Surgery) | Admitting: Thoracic Surgery (Cardiothoracic Vascular Surgery)

## 2012-09-03 VITALS — BP 108/64 | HR 79 | Temp 97.9°F | Resp 20 | Ht 65.0 in | Wt 218.0 lb

## 2012-09-03 DIAGNOSIS — I2510 Atherosclerotic heart disease of native coronary artery without angina pectoris: Secondary | ICD-10-CM

## 2012-09-03 DIAGNOSIS — C50919 Malignant neoplasm of unspecified site of unspecified female breast: Secondary | ICD-10-CM

## 2012-09-03 DIAGNOSIS — E119 Type 2 diabetes mellitus without complications: Secondary | ICD-10-CM

## 2012-09-03 DIAGNOSIS — I10 Essential (primary) hypertension: Secondary | ICD-10-CM

## 2012-09-03 DIAGNOSIS — E785 Hyperlipidemia, unspecified: Secondary | ICD-10-CM

## 2012-09-03 HISTORY — DX: Other specified postprocedural states: Z98.890

## 2012-09-03 HISTORY — DX: Gastritis, unspecified, without bleeding: K29.70

## 2012-09-03 HISTORY — DX: Malignant (primary) neoplasm, unspecified: C80.1

## 2012-09-03 HISTORY — DX: Headache: R51

## 2012-09-03 HISTORY — DX: Other specified postprocedural states: R11.2

## 2012-09-03 HISTORY — DX: Other complications of anesthesia, initial encounter: T88.59XA

## 2012-09-03 HISTORY — DX: Pneumonia, unspecified organism: J18.9

## 2012-09-03 HISTORY — DX: Adverse effect of unspecified anesthetic, initial encounter: T41.45XA

## 2012-09-03 LAB — COMPREHENSIVE METABOLIC PANEL
ALT: 11 U/L (ref 0–35)
AST: 17 U/L (ref 0–37)
Albumin: 3.4 g/dL — ABNORMAL LOW (ref 3.5–5.2)
Alkaline Phosphatase: 126 U/L — ABNORMAL HIGH (ref 39–117)
BUN: 11 mg/dL (ref 6–23)
CO2: 23 mEq/L (ref 19–32)
Calcium: 9.3 mg/dL (ref 8.4–10.5)
Chloride: 101 mEq/L (ref 96–112)
Creatinine, Ser: 0.84 mg/dL (ref 0.50–1.10)
GFR calc Af Amer: 76 mL/min — ABNORMAL LOW (ref >90)
GFR calc non Af Amer: 66 mL/min — ABNORMAL LOW (ref >90)
Glucose, Bld: 106 mg/dL — ABNORMAL HIGH (ref 70–99)
Potassium: 4.1 mEq/L (ref 3.5–5.1)
Sodium: 135 mEq/L (ref 135–145)
Total Bilirubin: 0.3 mg/dL (ref 0.3–1.2)
Total Protein: 8.6 g/dL — ABNORMAL HIGH (ref 6.0–8.3)

## 2012-09-03 LAB — URINALYSIS, ROUTINE W REFLEX MICROSCOPIC
Bilirubin Urine: NEGATIVE
Glucose, UA: NEGATIVE mg/dL
Hgb urine dipstick: NEGATIVE
Ketones, ur: NEGATIVE mg/dL
Nitrite: NEGATIVE
Protein, ur: NEGATIVE mg/dL
Specific Gravity, Urine: 1.02 (ref 1.005–1.030)
Urobilinogen, UA: 0.2 mg/dL (ref 0.0–1.0)
pH: 5 (ref 5.0–8.0)

## 2012-09-03 LAB — TYPE AND SCREEN
ABO/RH(D): O POS
Antibody Screen: NEGATIVE

## 2012-09-03 LAB — BLOOD GAS, ARTERIAL
Acid-Base Excess: 1.1 mmol/L (ref 0.0–2.0)
Bicarbonate: 24.8 mEq/L — ABNORMAL HIGH (ref 20.0–24.0)
Drawn by: 206361
FIO2: 0.21 %
O2 Saturation: 97.1 %
Patient temperature: 98.6
TCO2: 26 mmol/L (ref 0–100)
pCO2 arterial: 37.2 mmHg (ref 35.0–45.0)
pH, Arterial: 7.44 (ref 7.350–7.450)
pO2, Arterial: 87.1 mmHg (ref 80.0–100.0)

## 2012-09-03 LAB — CBC
HCT: 42.3 % (ref 36.0–46.0)
Hemoglobin: 14.3 g/dL (ref 12.0–15.0)
MCH: 28.9 pg (ref 26.0–34.0)
MCHC: 33.8 g/dL (ref 30.0–36.0)
MCV: 85.5 fL (ref 78.0–100.0)
Platelets: 255 10*3/uL (ref 150–400)
RBC: 4.95 MIL/uL (ref 3.87–5.11)
RDW: 14 % (ref 11.5–15.5)
WBC: 8.6 10*3/uL (ref 4.0–10.5)

## 2012-09-03 LAB — PROTIME-INR
INR: 0.97 (ref 0.00–1.49)
Prothrombin Time: 12.8 seconds (ref 11.6–15.2)

## 2012-09-03 LAB — PULMONARY FUNCTION TEST

## 2012-09-03 LAB — SURGICAL PCR SCREEN
MRSA, PCR: NEGATIVE
Staphylococcus aureus: NEGATIVE

## 2012-09-03 LAB — HEMOGLOBIN A1C
Hgb A1c MFr Bld: 6.8 % — ABNORMAL HIGH (ref <5.7)
Mean Plasma Glucose: 148 mg/dL — ABNORMAL HIGH (ref <117)

## 2012-09-03 LAB — APTT: aPTT: 31 seconds (ref 24–37)

## 2012-09-03 LAB — URINE MICROSCOPIC-ADD ON

## 2012-09-03 LAB — ABO/RH: ABO/RH(D): O POS

## 2012-09-03 MED ORDER — ALBUTEROL SULFATE (5 MG/ML) 0.5% IN NEBU
2.5000 mg | INHALATION_SOLUTION | Freq: Once | RESPIRATORY_TRACT | Status: AC
  Administered 2012-09-03: 2.5 mg via RESPIRATORY_TRACT
  Filled 2012-09-03: qty 0.5

## 2012-09-03 MED ORDER — CHLORHEXIDINE GLUCONATE 4 % EX LIQD: 30.0000 mL | CUTANEOUS | Status: DC

## 2012-09-03 NOTE — H&P (Signed)
CARDIOTHORACIC SURGERY HISTORY AND PHYSICAL EXAM  Referring Provider is Gollan, Kathlene November, MD PCP is Idelle Crouch, MD    Chief Complaint   Patient presents with   .  Coronary Artery Disease       Referral from Dr Rockey Situ for surgical eval on CAD, Cardiac Cath 08/22/12     HPI:  Patient is a 78 year old female from [place] no previous history of coronary artery disease but risk factors notable for history of hypertension, type 2 diabetes mellitus, hyperlipidemia, a family history of coronary artery disease and a remote history of tobacco use. The patient describes a greater than one-year history of tightness in her throat which is brought on with physical activity and relieved by rest. She states that she first experienced these symptoms more than a year ago and typically occur when the patient is moaning her longer doing something reasonably strenuous. 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 She did not undergo adjuvant chemotherapy.  During that time she was less active physically and she states that she hasn't really noticed her problems with tightness in her throat. However, this past summer she seemed to get her energy back in with added physical activity she began to experience similar symptoms but also became associated with exertional shortness of breath. He was seen by her otolaryngologist and subsequently referred to Dr. Rockey Situ because of concerns that her symptoms might represent angina pectoris. Over the past few months her symptoms have seemed to get some worsening she occasionally gets mild tightness in her throat using when she has not physically active, although for the most part her symptoms occur only with physical activity. Symptoms are usually relieved within 15-20 minutes of rest, and she has never had any particularly prolonged episodes of discomfort. She  has not had any pain in her chest, arm, nor back.  She underwent cardiac catheterization last week and was found to have 70% stenosis of the left main coronary artery with severe three-vessel coronary artery disease and preserved left ventricular function. The patient has now been referred for possible elective surgical revascularization.    Past Medical History   Diagnosis  Date   .  Breast mass, right     .  Vertigo     .  Hypertension     .  Thyroid disease     .  History of breast cancer         39 treatments of radiation. Negative chemo.   .  Diabetes     .  GERD (gastroesophageal reflux disease)     .  Hyperlipemia     .  History of seasonal allergies     .  Arthritis     .  Coronary artery disease  08/22/2012   .  Angina pectoris  08/26/2012       Past Surgical History   Procedure  Date   .  Abdominal hysterectomy     .  Ovarian cyst removal     .  Back surgery     .  Appendectomy     .  Breast surgery     .  Laparoscopic nissen fundoplication         Family History   Problem  Relation  Age of Onset   .  Heart disease  Brother         CABG & stents   .  Hyperlipidemia  Brother     .  Hypertension  Brother     .  Cancer  Father     .  Heart disease  Mother         History      Social History   .  Marital Status:  Married       Spouse Name:  N/A       Number of Children:  0   .  Years of Education:  N/A      Occupational History   .  retired        Social History Main Topics   .  Smoking status:  Former Smoker       Types:  Cigarettes       Quit date:  11/13/1988   .  Smokeless tobacco:  Not on file   .  Alcohol Use:  No   .  Drug Use:  No   .  Sexually Active:  Not on file      Other Topics  Concern   .  Not on file      Social History Narrative   .  No narrative on file       Current Outpatient Prescriptions   Medication  Sig  Dispense  Refill   .  aspirin 325 MG EC tablet  Take 325 mg by mouth daily.         .  cloNIDine (CATAPRES) 0.2 MG  tablet  Take 0.2 mg by mouth 2 (two) times daily.         Marland Kitchen  letrozole (FEMARA) 2.5 MG tablet  Take 2.5 mg by mouth daily.         Marland Kitchen  levothyroxine (SYNTHROID, LEVOTHROID) 125 MCG tablet  Take 125 mcg by mouth daily.         .  metoprolol tartrate (LOPRESSOR) 25 MG tablet  Take 1 tablet (25 mg total) by mouth 2 (two) times daily.   60 tablet   3   .  nitroGLYCERIN (NITROSTAT) 0.4 MG SL tablet  Place 1 tablet (0.4 mg total) under the tongue every 5 (five) minutes as needed for chest pain.   25 tablet   3   .  predniSONE (DELTASONE) 20 MG tablet  Take 3 tablets (60 mg) night before procedure  Take 3 tablets (60 mg) morning of procedure   6 tablet   0       Allergies   Allergen  Reactions   .  Ciprocinonide (Fluocinolone)         Feels crazy   .  Darvocet (Propoxyphene-Acetaminophen)         Difficulty breathing and swallowing   .  Ivp Dye (Iodinated Diagnostic Agents)         Nausea & vomiting   .  Statins         Muscle aches       Review of Systems:              General:                      normal appetite, decreased energy, no weight gain, no weight loss, no fever             Cardiac:                      no chest pain with exertion, no chest pain at rest, + SOB with moderate  exertion, no resting SOB, no PND, no orthopnea, no palpitations, no arrhythmia, no atrial fibrillation, no LE edema, no dizzy spells, no syncope             Respiratory:                + exertional shortness of breath, no home oxygen, no productive cough, + dry cough, no bronchitis, no wheezing, no hemoptysis, no asthma, no pain with inspiration or cough, no sleep apnea, no CPAP at night             GI:                                no difficulty swallowing, remote h/o reflux, no frequent heartburn, no hiatal hernia, no abdominal pain, no constipation, no diarrhea, no hematochezia, no hematemesis, no melena             GU:                              no dysuria,  no frequency, no urinary tract infection, no  hematuria, no kidney stones, no kidney disease             Vascular:                     no pain suggestive of claudication, no pain in feet, no leg cramps, no varicose veins, no DVT, no non-healing foot ulcer             Neuro:                         no stroke, no TIA's, no seizures, no headaches, no temporary blindness one eye,  no slurred speech, no peripheral neuropathy, no chronic pain, no instability of gait, no memory/cognitive dysfunction             Musculoskeletal:         + arthritis, no joint swelling, no myalgias, mild difficulty walking, normal mobility               Skin:                            no rash, no itching, no skin infections, no pressure sores or ulcerations             Psych:                         no anxiety, no depression, no nervousness, no unusual recent stress             Eyes:                           no blurry vision, no floaters, no recent vision changes,  wears glasses or contacts             ENT:                            no hearing loss, no loose or painful teeth, no dentures             Hematologic:  no easy bruising, no abnormal bleeding, no clotting disorder, no frequent epistaxis             Endocrine:                   + diabetes, does not check CBG's at home                           Physical Exam:              BP 178/89  Pulse 77  Resp 20  Ht 5\' 5"  (1.651 m)  Wt 220 lb (99.791 kg)  BMI 36.61 kg/m2  SpO2 94%             General:                      Obese but o/w  well-appearing             HEENT:                       Unremarkable               Neck:                           no JVD, no bruits, no adenopathy               Chest:                         clear to auscultation, symmetrical breath sounds, no wheezes, no rhonchi               CV:                              RRR, no murmur               Abdomen:                    soft, non-tender, no masses               Extremities:                 warm, well-perfused, pulses  diminished, no LE edema             Rectal/GU                   Deferred             Neuro:                         Grossly non-focal and symmetrical throughout             Skin:                            Clean and dry, no rashes, no breakdown   Diagnostic Tests:  Cardiac catheterization performed 08/22/2012 at [facility] is directly reviewed. The patient has right dominant coronary circulation with diffuse coronary artery disease suggestive of underlying diabetes mellitus. There is tubular 70% stenosis of the distal left main coronary artery.  There is 70-80% proximal stenosis of the left anterior ascending coronary artery. There is a medium to large  size bifurcating diagonal branch that has diffuse disease is in both branches including 90% stenosis of the lateral subbranch. There is 90% ostial stenosis of left circumflex coronary artery. His 50% proximal stenosis of the right coronary artery with long segment tubular 60-70% proximal stenosis of the posterior descending coronary artery. Left ventricular function appears normal with ejection fraction estimated greater than 60%.   Impression:  Left main disease with severe three-vessel coronary artery disease and preserved left ventricular systolic function. The patient presents with symptoms of exertional tightness in her throat consistent with angina pectoris. I agree that she would best be treated with surgical revascularization. Risks associated with surgery will be slightly elevated because of the patient's advanced age, obesity, and type 2 diabetes mellitus.   Plan:  I spent in axis of 60 minutes with the patient and her husband in the office today discussing her current condition and alternative treatment strategies for the future. We discussed at length the indications, risks, and potential benefits of coronary artery bypass grafting. The patient and her husband understand and accept all potential associated risks of  surgery including but not limited to risk of death, stroke, myocardial infarction, congestive heart failure, respiratory failure, renal failure, pneumonia, bleeding requiring blood transfusion, arrhythmia, infection, and late recurrence of symptomatic coronary artery disease. All of her questions been addressed. We tentatively plan to proceed with surgery on December 5.    Valentina Gu. Roxy Manns, MD 08/26/2012 2:53 PM

## 2012-09-03 NOTE — Pre-Procedure Instructions (Signed)
[place]  09/03/2012   Your procedure is scheduled on:  Thursday September 05, 2012  Report to [facility] at 5:30 AM.  Call this number if you have problems the morning of surgery: [phone]   Remember:   Do not eat food or drink :After Midnight.    Take these medicines the morning of surgery with A SIP OF WATER: clonidine, letrozole, synthroid, metoprolol, nitroglycerin (if needed).  DISCONTINUE PLAVIX AND ANY NSAIDS 7 DAYS PRIOR TO SURGERY   Do not wear jewelry, make-up or nail polish.  Do not wear lotions, powders, or perfumes.   Do not shave 48 hours prior to surgery.  Do not bring valuables to the hospital.  Contacts, dentures or bridgework may not be worn into surgery.  Leave suitcase in the car. After surgery it may be brought to your room.  For patients admitted to the hospital, checkout time is 11:00 AM the day of discharge.   Patients discharged the day of surgery will not be allowed to drive home.  Name and phone number of your driver: FAMILY / FRIEND  Special Instructions: Shower using CHG 2 nights before surgery and the night before surgery.  If you shower the day of surgery use CHG.  Use special wash - you have one bottle of CHG for all showers.  You should use approximately 1/3 of the bottle for each shower.   Please read over the following fact sheets that you were given: Pain Booklet, Coughing and Deep Breathing, Blood Transfusion Information, Open Heart Packet, MRSA Information and Surgical Site Infection Prevention

## 2012-09-03 NOTE — Progress Notes (Signed)
VASCULAR LAB PRELIMINARY  PRELIMINARY  PRELIMINARY  PRELIMINARY  Pre-op Cardiac Surgery  Carotid Findings:  No evidence of significant ICA stenosis. Vertebral artery flow is antegrade  Upper Extremity Right Left  Brachial Pressures 122 Triphasic 124 Triphasic  Radial Waveforms Triphasic Triphasic  Ulnar Waveforms Biphasic Triphasic  Palmar Arch (Allen's Test) Normal Normal   Findings:  Doppler waveforms remained normal bilaterally with both radial and ulnar compressions.    Lower  Extremity Right Left      Anterior Tibial 155  Monophasic 132  Monophasic  Posterior Tibial 167 Triphasic 169 Triphasic  Ankle/Brachial Indices 1.35 1.36    Findings:  ABIs indicate normal arterial flow bilaterally with a slight elevation of pressures consistent with earl calcification.   Namish Krise, RVS 09/03/2012, 2:44 PM

## 2012-09-04 MED ORDER — PHENYLEPHRINE HCL 10 MG/ML IJ SOLN
30.0000 ug/min | INTRAVENOUS | Status: DC
  Filled 2012-09-04: qty 2

## 2012-09-04 MED ORDER — NITROGLYCERIN IN D5W 200-5 MCG/ML-% IV SOLN
2.0000 ug/min | INTRAVENOUS | Status: DC
  Filled 2012-09-04: qty 250

## 2012-09-04 MED ORDER — SODIUM CHLORIDE 0.9 % IV SOLN
INTRAVENOUS | Status: DC
  Filled 2012-09-04: qty 1

## 2012-09-04 MED ORDER — EPINEPHRINE HCL 1 MG/ML IJ SOLN
0.5000 ug/min | INTRAVENOUS | Status: DC
  Filled 2012-09-04: qty 4

## 2012-09-04 MED ORDER — DEXTROSE 5 % IV SOLN
1.5000 g | INTRAVENOUS | Status: AC
  Administered 2012-09-05: 1.5 g via INTRAVENOUS
  Administered 2012-09-05: .75 g via INTRAVENOUS
  Filled 2012-09-04 (×2): qty 1.5

## 2012-09-04 MED ORDER — POTASSIUM CHLORIDE 2 MEQ/ML IV SOLN
80.0000 meq | INTRAVENOUS | Status: DC
  Filled 2012-09-04: qty 40

## 2012-09-04 MED ORDER — PAPAVERINE HCL 30 MG/ML IJ SOLN
INTRAMUSCULAR | Status: AC
  Administered 2012-09-05: 09:00:00
  Filled 2012-09-04: qty 2.5

## 2012-09-04 MED ORDER — SODIUM CHLORIDE 0.9 % IV SOLN
INTRAVENOUS | Status: DC
  Filled 2012-09-04: qty 40

## 2012-09-04 MED ORDER — DOPAMINE-DEXTROSE 3.2-5 MG/ML-% IV SOLN
2.0000 ug/kg/min | INTRAVENOUS | Status: DC
  Filled 2012-09-04: qty 250

## 2012-09-04 MED ORDER — DEXMEDETOMIDINE HCL IN NACL 400 MCG/100ML IV SOLN
0.1000 ug/kg/h | INTRAVENOUS | Status: DC
  Filled 2012-09-04: qty 100

## 2012-09-04 MED ORDER — DEXTROSE 5 % IV SOLN
750.0000 mg | INTRAVENOUS | Status: DC
  Filled 2012-09-04: qty 750

## 2012-09-04 MED ORDER — MAGNESIUM SULFATE 50 % IJ SOLN
40.0000 meq | INTRAMUSCULAR | Status: DC
  Filled 2012-09-04: qty 10

## 2012-09-04 MED ORDER — VANCOMYCIN HCL 10 G IV SOLR
1500.0000 mg | INTRAVENOUS | Status: AC
  Administered 2012-09-05: 1500 mg via INTRAVENOUS
  Filled 2012-09-04 (×2): qty 1500

## 2012-09-05 ENCOUNTER — Inpatient Hospital Stay (HOSPITAL_COMMUNITY): Payer: Medicare Other

## 2012-09-05 ENCOUNTER — Encounter (HOSPITAL_COMMUNITY)
Admission: RE | Disposition: A | Discharge status: Rehabilitation Facility | Payer: Self-pay | Source: Ambulatory Visit | Attending: Thoracic Surgery (Cardiothoracic Vascular Surgery)

## 2012-09-05 ENCOUNTER — Encounter (HOSPITAL_COMMUNITY): Payer: Self-pay | Admitting: *Deleted

## 2012-09-05 ENCOUNTER — Inpatient Hospital Stay (HOSPITAL_COMMUNITY): Payer: Medicare Other | Admitting: Anesthesiology

## 2012-09-05 ENCOUNTER — Encounter (HOSPITAL_COMMUNITY): Payer: Self-pay | Admitting: Anesthesiology

## 2012-09-05 ENCOUNTER — Inpatient Hospital Stay (HOSPITAL_COMMUNITY)
Admission: RE | Admit: 2012-09-05 | Discharge: 2012-09-10 | DRG: 235 | Disposition: A | Discharge status: Rehabilitation Facility | Payer: Medicare Other | Source: Ambulatory Visit | Attending: Thoracic Surgery (Cardiothoracic Vascular Surgery) | Admitting: Thoracic Surgery (Cardiothoracic Vascular Surgery)

## 2012-09-05 DIAGNOSIS — I2510 Atherosclerotic heart disease of native coronary artery without angina pectoris: Secondary | ICD-10-CM

## 2012-09-05 DIAGNOSIS — K219 Gastro-esophageal reflux disease without esophagitis: Secondary | ICD-10-CM | POA: Diagnosis present

## 2012-09-05 DIAGNOSIS — Z853 Personal history of malignant neoplasm of breast: Secondary | ICD-10-CM

## 2012-09-05 DIAGNOSIS — D62 Acute posthemorrhagic anemia: Secondary | ICD-10-CM | POA: Diagnosis not present

## 2012-09-05 DIAGNOSIS — Y832 Surgical operation with anastomosis, bypass or graft as the cause of abnormal reaction of the patient, or of later complication, without mention of misadventure at the time of the procedure: Secondary | ICD-10-CM | POA: Diagnosis not present

## 2012-09-05 DIAGNOSIS — Z7982 Long term (current) use of aspirin: Secondary | ICD-10-CM

## 2012-09-05 DIAGNOSIS — K5900 Constipation, unspecified: Secondary | ICD-10-CM | POA: Diagnosis not present

## 2012-09-05 DIAGNOSIS — E119 Type 2 diabetes mellitus without complications: Secondary | ICD-10-CM | POA: Diagnosis present

## 2012-09-05 DIAGNOSIS — E8779 Other fluid overload: Secondary | ICD-10-CM | POA: Diagnosis not present

## 2012-09-05 DIAGNOSIS — IMO0002 Reserved for concepts with insufficient information to code with codable children: Secondary | ICD-10-CM | POA: Diagnosis not present

## 2012-09-05 DIAGNOSIS — R5381 Other malaise: Secondary | ICD-10-CM | POA: Diagnosis not present

## 2012-09-05 DIAGNOSIS — M129 Arthropathy, unspecified: Secondary | ICD-10-CM | POA: Diagnosis present

## 2012-09-05 DIAGNOSIS — Y9210 Unspecified residential institution as the place of occurrence of the external cause: Secondary | ICD-10-CM | POA: Diagnosis not present

## 2012-09-05 DIAGNOSIS — I209 Angina pectoris, unspecified: Secondary | ICD-10-CM | POA: Diagnosis present

## 2012-09-05 DIAGNOSIS — I6340 Cerebral infarction due to embolism of unspecified cerebral artery: Secondary | ICD-10-CM | POA: Diagnosis not present

## 2012-09-05 DIAGNOSIS — Z951 Presence of aortocoronary bypass graft: Secondary | ICD-10-CM

## 2012-09-05 DIAGNOSIS — Z79899 Other long term (current) drug therapy: Secondary | ICD-10-CM

## 2012-09-05 DIAGNOSIS — Z8249 Family history of ischemic heart disease and other diseases of the circulatory system: Secondary | ICD-10-CM

## 2012-09-05 DIAGNOSIS — Z87891 Personal history of nicotine dependence: Secondary | ICD-10-CM

## 2012-09-05 DIAGNOSIS — E785 Hyperlipidemia, unspecified: Secondary | ICD-10-CM | POA: Diagnosis present

## 2012-09-05 DIAGNOSIS — G8190 Hemiplegia, unspecified affecting unspecified side: Secondary | ICD-10-CM | POA: Diagnosis not present

## 2012-09-05 DIAGNOSIS — I10 Essential (primary) hypertension: Secondary | ICD-10-CM | POA: Diagnosis present

## 2012-09-05 HISTORY — DX: Presence of aortocoronary bypass graft: Z95.1

## 2012-09-05 HISTORY — PX: CORONARY ARTERY BYPASS GRAFT: SHX141

## 2012-09-05 HISTORY — PX: INTRAOPERATIVE TRANSESOPHAGEAL ECHOCARDIOGRAM: SHX5062

## 2012-09-05 LAB — POCT I-STAT 3, ART BLOOD GAS (G3+)
Acid-Base Excess: 2 mmol/L (ref 0.0–2.0)
Acid-Base Excess: 1 mmol/L (ref 0.0–2.0)
Acid-base deficit: 2 mmol/L (ref 0.0–2.0)
Acid-base deficit: 1 mmol/L (ref 0.0–2.0)
Bicarbonate: 25.6 mEq/L — ABNORMAL HIGH (ref 20.0–24.0)
Bicarbonate: 22.3 mEq/L (ref 20.0–24.0)
Bicarbonate: 25.9 mEq/L — ABNORMAL HIGH (ref 20.0–24.0)
Bicarbonate: 23.8 mEq/L (ref 20.0–24.0)
O2 Saturation: 100 %
O2 Saturation: 98 %
O2 Saturation: 98 %
O2 Saturation: 96 %
Patient temperature: 35.5
Patient temperature: 36.7
Patient temperature: 37
TCO2: 27 mmol/L (ref 0–100)
TCO2: 23 mmol/L (ref 0–100)
TCO2: 27 mmol/L (ref 0–100)
TCO2: 25 mmol/L (ref 0–100)
pCO2 arterial: 36.3 mmHg (ref 35.0–45.0)
pCO2 arterial: 34.8 mmHg — ABNORMAL LOW (ref 35.0–45.0)
pCO2 arterial: 40.4 mmHg (ref 35.0–45.0)
pCO2 arterial: 39.8 mmHg (ref 35.0–45.0)
pH, Arterial: 7.456 — ABNORMAL HIGH (ref 7.350–7.450)
pH, Arterial: 7.408 (ref 7.350–7.450)
pH, Arterial: 7.414 (ref 7.350–7.450)
pH, Arterial: 7.385 (ref 7.350–7.450)
pO2, Arterial: 341 mmHg — ABNORMAL HIGH (ref 80.0–100.0)
pO2, Arterial: 101 mmHg — ABNORMAL HIGH (ref 80.0–100.0)
pO2, Arterial: 109 mmHg — ABNORMAL HIGH (ref 80.0–100.0)
pO2, Arterial: 80 mmHg (ref 80.0–100.0)

## 2012-09-05 LAB — CBC
HCT: 29 % — ABNORMAL LOW (ref 36.0–46.0)
HCT: 29 % — ABNORMAL LOW (ref 36.0–46.0)
Hemoglobin: 9.8 g/dL — ABNORMAL LOW (ref 12.0–15.0)
Hemoglobin: 9.9 g/dL — ABNORMAL LOW (ref 12.0–15.0)
MCH: 28.9 pg (ref 26.0–34.0)
MCH: 29 pg (ref 26.0–34.0)
MCHC: 33.8 g/dL (ref 30.0–36.0)
MCHC: 34.1 g/dL (ref 30.0–36.0)
MCV: 85.5 fL (ref 78.0–100.0)
MCV: 85 fL (ref 78.0–100.0)
Platelets: 161 10*3/uL (ref 150–400)
Platelets: 156 10*3/uL (ref 150–400)
RBC: 3.39 MIL/uL — ABNORMAL LOW (ref 3.87–5.11)
RBC: 3.41 MIL/uL — ABNORMAL LOW (ref 3.87–5.11)
RDW: 13.9 % (ref 11.5–15.5)
RDW: 14 % (ref 11.5–15.5)
WBC: 12.1 10*3/uL — ABNORMAL HIGH (ref 4.0–10.5)
WBC: 14.9 10*3/uL — ABNORMAL HIGH (ref 4.0–10.5)

## 2012-09-05 LAB — POCT I-STAT, CHEM 8
BUN: 8 mg/dL (ref 6–23)
Calcium, Ion: 1.17 mmol/L (ref 1.13–1.30)
Chloride: 104 mEq/L (ref 96–112)
Creatinine, Ser: 0.7 mg/dL (ref 0.50–1.10)
Glucose, Bld: 151 mg/dL — ABNORMAL HIGH (ref 70–99)
HCT: 28 % — ABNORMAL LOW (ref 36.0–46.0)
Hemoglobin: 9.5 g/dL — ABNORMAL LOW (ref 12.0–15.0)
Potassium: 4.3 mEq/L (ref 3.5–5.1)
Sodium: 137 mEq/L (ref 135–145)
TCO2: 22 mmol/L (ref 0–100)

## 2012-09-05 LAB — GLUCOSE, CAPILLARY
Glucose-Capillary: 173 mg/dL — ABNORMAL HIGH (ref 70–99)
Glucose-Capillary: 112 mg/dL — ABNORMAL HIGH (ref 70–99)
Glucose-Capillary: 104 mg/dL — ABNORMAL HIGH (ref 70–99)
Glucose-Capillary: 110 mg/dL — ABNORMAL HIGH (ref 70–99)
Glucose-Capillary: 139 mg/dL — ABNORMAL HIGH (ref 70–99)
Glucose-Capillary: 129 mg/dL — ABNORMAL HIGH (ref 70–99)
Glucose-Capillary: 136 mg/dL — ABNORMAL HIGH (ref 70–99)
Glucose-Capillary: 149 mg/dL — ABNORMAL HIGH (ref 70–99)
Glucose-Capillary: 151 mg/dL — ABNORMAL HIGH (ref 70–99)
Glucose-Capillary: 160 mg/dL — ABNORMAL HIGH (ref 70–99)

## 2012-09-05 LAB — POCT I-STAT 4, (NA,K, GLUC, HGB,HCT)
Glucose, Bld: 160 mg/dL — ABNORMAL HIGH (ref 70–99)
Glucose, Bld: 128 mg/dL — ABNORMAL HIGH (ref 70–99)
Glucose, Bld: 144 mg/dL — ABNORMAL HIGH (ref 70–99)
Glucose, Bld: 161 mg/dL — ABNORMAL HIGH (ref 70–99)
Glucose, Bld: 177 mg/dL — ABNORMAL HIGH (ref 70–99)
Glucose, Bld: 122 mg/dL — ABNORMAL HIGH (ref 70–99)
HCT: 35 % — ABNORMAL LOW (ref 36.0–46.0)
HCT: 25 % — ABNORMAL LOW (ref 36.0–46.0)
HCT: 34 % — ABNORMAL LOW (ref 36.0–46.0)
HCT: 26 % — ABNORMAL LOW (ref 36.0–46.0)
HCT: 27 % — ABNORMAL LOW (ref 36.0–46.0)
HCT: 30 % — ABNORMAL LOW (ref 36.0–46.0)
Hemoglobin: 11.9 g/dL — ABNORMAL LOW (ref 12.0–15.0)
Hemoglobin: 11.6 g/dL — ABNORMAL LOW (ref 12.0–15.0)
Hemoglobin: 8.5 g/dL — ABNORMAL LOW (ref 12.0–15.0)
Hemoglobin: 8.8 g/dL — ABNORMAL LOW (ref 12.0–15.0)
Hemoglobin: 9.2 g/dL — ABNORMAL LOW (ref 12.0–15.0)
Hemoglobin: 10.2 g/dL — ABNORMAL LOW (ref 12.0–15.0)
Potassium: 4.2 mEq/L (ref 3.5–5.1)
Potassium: 4.2 mEq/L (ref 3.5–5.1)
Potassium: 4.3 mEq/L (ref 3.5–5.1)
Potassium: 4.5 mEq/L (ref 3.5–5.1)
Potassium: 3.6 mEq/L (ref 3.5–5.1)
Potassium: 3.4 mEq/L — ABNORMAL LOW (ref 3.5–5.1)
Sodium: 138 mEq/L (ref 135–145)
Sodium: 136 mEq/L (ref 135–145)
Sodium: 137 mEq/L (ref 135–145)
Sodium: 136 mEq/L (ref 135–145)
Sodium: 139 mEq/L (ref 135–145)
Sodium: 139 mEq/L (ref 135–145)

## 2012-09-05 LAB — HEMOGLOBIN AND HEMATOCRIT, BLOOD
HCT: 25.5 % — ABNORMAL LOW (ref 36.0–46.0)
Hemoglobin: 8.8 g/dL — ABNORMAL LOW (ref 12.0–15.0)

## 2012-09-05 LAB — CREATININE, SERUM
Creatinine, Ser: 0.66 mg/dL (ref 0.50–1.10)
GFR calc Af Amer: >90 mL/min (ref >90)
GFR calc non Af Amer: 84 mL/min — ABNORMAL LOW (ref >90)

## 2012-09-05 LAB — PLATELET COUNT: Platelets: 209 10*3/uL (ref 150–400)

## 2012-09-05 LAB — APTT: aPTT: 31 seconds (ref 24–37)

## 2012-09-05 LAB — MAGNESIUM: Magnesium: 2.4 mg/dL (ref 1.5–2.5)

## 2012-09-05 LAB — PROTIME-INR
INR: 1.32 (ref 0.00–1.49)
Prothrombin Time: 16.1 seconds — ABNORMAL HIGH (ref 11.6–15.2)

## 2012-09-05 SURGERY — CORONARY ARTERY BYPASS GRAFTING (CABG)
Anesthesia: General | Site: Chest | Wound class: Clean

## 2012-09-05 MED ORDER — SODIUM CHLORIDE 0.9 % IV SOLN
INTRAVENOUS | Status: DC
  Administered 2012-09-06: 02:00:00 via INTRAVENOUS
  Filled 2012-09-05 (×2): qty 1

## 2012-09-05 MED ORDER — ONDANSETRON HCL 4 MG/2ML IJ SOLN
4.0000 mg | Freq: Four times a day (QID) | INTRAMUSCULAR | Status: DC | PRN
  Administered 2012-09-05 – 2012-09-10 (×4): 4 mg via INTRAVENOUS
  Filled 2012-09-05 (×4): qty 2

## 2012-09-05 MED ORDER — MORPHINE SULFATE 2 MG/ML IJ SOLN
1.0000 mg | INTRAMUSCULAR | Status: AC | PRN
  Administered 2012-09-05 (×2): 2 mg via INTRAVENOUS

## 2012-09-05 MED ORDER — BLISTEX EX OINT
TOPICAL_OINTMENT | CUTANEOUS | Status: DC | PRN
  Filled 2012-09-05: qty 10

## 2012-09-05 MED ORDER — LEVOTHYROXINE SODIUM 125 MCG PO TABS
125.0000 ug | ORAL_TABLET | Freq: Every day | ORAL | Status: DC
  Administered 2012-09-06 – 2012-09-10 (×5): 125 ug via ORAL
  Filled 2012-09-05 (×7): qty 1

## 2012-09-05 MED ORDER — SODIUM CHLORIDE 0.9 % IJ SOLN: 3.0000 mL | INTRAMUSCULAR | Status: DC | PRN

## 2012-09-05 MED ORDER — BISACODYL 5 MG PO TBEC
10.0000 mg | DELAYED_RELEASE_TABLET | Freq: Every day | ORAL | Status: DC
  Administered 2012-09-06 – 2012-09-10 (×5): 10 mg via ORAL
  Filled 2012-09-05 (×5): qty 2

## 2012-09-05 MED ORDER — DOCUSATE SODIUM 100 MG PO CAPS
200.0000 mg | ORAL_CAPSULE | Freq: Every day | ORAL | Status: DC
  Administered 2012-09-06 – 2012-09-10 (×5): 200 mg via ORAL
  Filled 2012-09-05 (×5): qty 2

## 2012-09-05 MED ORDER — LACTATED RINGERS IV SOLN
INTRAVENOUS | Status: DC
  Administered 2012-09-05: 16:00:00 via INTRAVENOUS

## 2012-09-05 MED ORDER — INSULIN REGULAR BOLUS VIA INFUSION
0.0000 [IU] | Freq: Three times a day (TID) | INTRAVENOUS | Status: DC
  Filled 2012-09-05: qty 10

## 2012-09-05 MED ORDER — DEXTROSE 5 % IV SOLN
1.5000 g | Freq: Two times a day (BID) | INTRAVENOUS | Status: AC
  Administered 2012-09-05 – 2012-09-07 (×4): 1.5 g via INTRAVENOUS
  Filled 2012-09-05 (×4): qty 1.5

## 2012-09-05 MED ORDER — 0.9 % SODIUM CHLORIDE (POUR BTL) OPTIME
TOPICAL | Status: DC | PRN
  Administered 2012-09-05: 1000 mL

## 2012-09-05 MED ORDER — DEXMEDETOMIDINE HCL IN NACL 200 MCG/50ML IV SOLN
INTRAVENOUS | Status: DC | PRN
  Administered 2012-09-05: 0.2 ug/kg/h via INTRAVENOUS

## 2012-09-05 MED ORDER — MAGNESIUM SULFATE 40 MG/ML IJ SOLN
4.0000 g | Freq: Once | INTRAMUSCULAR | Status: AC
  Administered 2012-09-05: 4 g via INTRAVENOUS
  Filled 2012-09-05: qty 100

## 2012-09-05 MED ORDER — HEPARIN SODIUM (PORCINE) 1000 UNIT/ML IJ SOLN
INTRAMUSCULAR | Status: AC
  Filled 2012-09-05: qty 2

## 2012-09-05 MED ORDER — METOPROLOL TARTRATE 25 MG/10 ML ORAL SUSPENSION
12.5000 mg | Freq: Two times a day (BID) | ORAL | Status: DC
  Administered 2012-09-05: 12.5 mg
  Filled 2012-09-05 (×3): qty 5

## 2012-09-05 MED ORDER — OXYCODONE HCL 5 MG PO TABS
5.0000 mg | ORAL_TABLET | ORAL | Status: DC | PRN
Start: 2012-09-05 — End: 2012-09-10
  Administered 2012-09-06 – 2012-09-08 (×5): 10 mg via ORAL
  Filled 2012-09-05 (×5): qty 2

## 2012-09-05 MED ORDER — PROPOFOL 10 MG/ML IV BOLUS
INTRAVENOUS | Status: DC | PRN
  Administered 2012-09-05: 20 mg via INTRAVENOUS

## 2012-09-05 MED ORDER — GELATIN ABSORBABLE MT POWD
OROMUCOSAL | Status: DC | PRN
  Administered 2012-09-05 (×5): via TOPICAL

## 2012-09-05 MED ORDER — ACETAMINOPHEN 10 MG/ML IV SOLN
1000.0000 mg | Freq: Once | INTRAVENOUS | Status: AC
  Administered 2012-09-05: 1000 mg via INTRAVENOUS
  Filled 2012-09-05: qty 100

## 2012-09-05 MED ORDER — MIDAZOLAM HCL 5 MG/5ML IJ SOLN
INTRAMUSCULAR | Status: DC | PRN
  Administered 2012-09-05 (×2): 2 mg via INTRAVENOUS
  Administered 2012-09-05: 1 mg via INTRAVENOUS

## 2012-09-05 MED ORDER — ASPIRIN EC 325 MG PO TBEC
325.0000 mg | DELAYED_RELEASE_TABLET | Freq: Every day | ORAL | Status: DC
  Administered 2012-09-06 – 2012-09-10 (×5): 325 mg via ORAL
  Filled 2012-09-05 (×5): qty 1

## 2012-09-05 MED ORDER — SODIUM CHLORIDE 0.9 % IV SOLN
INTRAVENOUS | Status: DC | PRN
  Administered 2012-09-05: 12:00:00 via INTRAVENOUS

## 2012-09-05 MED ORDER — METOPROLOL TARTRATE 12.5 MG HALF TABLET: 12.5000 mg | ORAL_TABLET | Freq: Once | ORAL | Status: DC

## 2012-09-05 MED ORDER — SODIUM CHLORIDE 0.9 % IV SOLN
INTRAVENOUS | Status: DC
  Administered 2012-09-06: 23:00:00 via INTRAVENOUS

## 2012-09-05 MED ORDER — NITROGLYCERIN IN D5W 200-5 MCG/ML-% IV SOLN
INTRAVENOUS | Status: DC | PRN
  Administered 2012-09-05: 16.6 ug/min via INTRAVENOUS

## 2012-09-05 MED ORDER — PROTAMINE SULFATE 10 MG/ML IV SOLN
INTRAVENOUS | Status: DC | PRN
  Administered 2012-09-05: 10 mg via INTRAVENOUS
  Administered 2012-09-05: 260 mg via INTRAVENOUS

## 2012-09-05 MED ORDER — SODIUM CHLORIDE 0.9 % IV SOLN
10.0000 g | INTRAVENOUS | Status: DC | PRN
  Administered 2012-09-05 (×2): 1 g/h via INTRAVENOUS

## 2012-09-05 MED ORDER — LACTATED RINGERS IV SOLN
INTRAVENOUS | Status: DC | PRN
  Administered 2012-09-05: 07:00:00 via INTRAVENOUS

## 2012-09-05 MED ORDER — MIDAZOLAM HCL 2 MG/2ML IJ SOLN: 2.0000 mg | INTRAMUSCULAR | Status: DC | PRN

## 2012-09-05 MED ORDER — SODIUM CHLORIDE 0.45 % IV SOLN
INTRAVENOUS | Status: DC
  Administered 2012-09-05: 14:00:00 via INTRAVENOUS

## 2012-09-05 MED ORDER — ASPIRIN 81 MG PO CHEW: 324.0000 mg | CHEWABLE_TABLET | Freq: Every day | ORAL | Status: DC

## 2012-09-05 MED ORDER — LIDOCAINE HCL (CARDIAC) 20 MG/ML IV SOLN
INTRAVENOUS | Status: AC
  Filled 2012-09-05: qty 5

## 2012-09-05 MED ORDER — ESMOLOL HCL 10 MG/ML IV SOLN
INTRAVENOUS | Status: DC | PRN
  Administered 2012-09-05 (×2): 20 mg via INTRAVENOUS

## 2012-09-05 MED ORDER — FAMOTIDINE IN NACL 20-0.9 MG/50ML-% IV SOLN
20.0000 mg | Freq: Two times a day (BID) | INTRAVENOUS | Status: AC
  Administered 2012-09-05: 20 mg via INTRAVENOUS

## 2012-09-05 MED ORDER — METOPROLOL TARTRATE 1 MG/ML IV SOLN
2.5000 mg | INTRAVENOUS | Status: DC | PRN
  Administered 2012-09-07 (×2): 5 mg via INTRAVENOUS
  Filled 2012-09-05 (×2): qty 5

## 2012-09-05 MED ORDER — WHITE PETROLATUM GEL
Status: DC | PRN
  Filled 2012-09-05: qty 5

## 2012-09-05 MED ORDER — SODIUM CHLORIDE 0.9 % IV SOLN: 250.0000 mL | INTRAVENOUS | Status: DC

## 2012-09-05 MED ORDER — LACTATED RINGERS IV SOLN
INTRAVENOUS | Status: DC | PRN
  Administered 2012-09-05 (×2): via INTRAVENOUS

## 2012-09-05 MED ORDER — HEPARIN SODIUM (PORCINE) 1000 UNIT/ML IJ SOLN
INTRAMUSCULAR | Status: DC | PRN
  Administered 2012-09-05: 2000 [IU] via INTRAVENOUS
  Administered 2012-09-05: 33000 [IU] via INTRAVENOUS

## 2012-09-05 MED ORDER — SODIUM CHLORIDE 0.9 % IV SOLN
100.0000 [IU] | INTRAVENOUS | Status: DC | PRN
  Administered 2012-09-05: 3 [IU]/h via INTRAVENOUS

## 2012-09-05 MED ORDER — METOPROLOL TARTRATE 12.5 MG HALF TABLET
12.5000 mg | ORAL_TABLET | Freq: Two times a day (BID) | ORAL | Status: DC
  Filled 2012-09-05 (×3): qty 1

## 2012-09-05 MED ORDER — PHENYLEPHRINE HCL 10 MG/ML IJ SOLN
INTRAMUSCULAR | Status: DC | PRN
  Administered 2012-09-05: 40 ug via INTRAVENOUS

## 2012-09-05 MED ORDER — ALBUMIN HUMAN 5 % IV SOLN
250.0000 mL | INTRAVENOUS | Status: AC | PRN
  Administered 2012-09-05 (×3): 250 mL via INTRAVENOUS
  Filled 2012-09-05: qty 250

## 2012-09-05 MED ORDER — ACETAMINOPHEN 160 MG/5ML PO SOLN: 975.0000 mg | Freq: Four times a day (QID) | ORAL | Status: DC

## 2012-09-05 MED ORDER — SODIUM CHLORIDE 0.9 % IJ SOLN
3.0000 mL | Freq: Two times a day (BID) | INTRAMUSCULAR | Status: DC
  Administered 2012-09-06 – 2012-09-08 (×6): 3 mL via INTRAVENOUS

## 2012-09-05 MED ORDER — AMINOCAPROIC ACID 250 MG/ML IV SOLN
INTRAVENOUS | Status: DC | PRN
  Administered 2012-09-05: 5 g via INTRAVENOUS

## 2012-09-05 MED ORDER — VANCOMYCIN HCL IN DEXTROSE 1-5 GM/200ML-% IV SOLN
1000.0000 mg | Freq: Once | INTRAVENOUS | Status: AC
  Administered 2012-09-05: 1000 mg via INTRAVENOUS
  Filled 2012-09-05: qty 200

## 2012-09-05 MED ORDER — LIDOCAINE HCL (CARDIAC) 20 MG/ML IV SOLN
INTRAVENOUS | Status: DC | PRN
  Administered 2012-09-05: 30 mg via INTRAVENOUS

## 2012-09-05 MED ORDER — POTASSIUM CHLORIDE 10 MEQ/50ML IV SOLN
10.0000 meq | INTRAVENOUS | Status: AC
  Administered 2012-09-05 (×3): 10 meq via INTRAVENOUS

## 2012-09-05 MED ORDER — BISACODYL 10 MG RE SUPP: 10.0000 mg | Freq: Every day | RECTAL | Status: DC

## 2012-09-05 MED ORDER — PHENYLEPHRINE HCL 10 MG/ML IJ SOLN
0.0000 ug/min | INTRAVENOUS | Status: DC
  Filled 2012-09-05: qty 2

## 2012-09-05 MED ORDER — NITROGLYCERIN IN D5W 200-5 MCG/ML-% IV SOLN
0.0000 ug/min | INTRAVENOUS | Status: DC
  Administered 2012-09-06: 40 ug/min via INTRAVENOUS
  Filled 2012-09-05: qty 250

## 2012-09-05 MED ORDER — SODIUM BICARBONATE 8.4 % IV SOLN
INTRAVENOUS | Status: AC
  Filled 2012-09-05: qty 50

## 2012-09-05 MED ORDER — ALBUMIN HUMAN 5 % IV SOLN
INTRAVENOUS | Status: DC | PRN
  Administered 2012-09-05: 13:00:00 via INTRAVENOUS

## 2012-09-05 MED ORDER — ROCURONIUM BROMIDE 100 MG/10ML IV SOLN
INTRAVENOUS | Status: DC | PRN
  Administered 2012-09-05: 50 mg via INTRAVENOUS

## 2012-09-05 MED ORDER — MORPHINE SULFATE 2 MG/ML IJ SOLN
2.0000 mg | INTRAMUSCULAR | Status: DC | PRN
  Administered 2012-09-05 (×3): 2 mg via INTRAVENOUS
  Administered 2012-09-05: 4 mg via INTRAVENOUS
  Administered 2012-09-06 (×3): 2 mg via INTRAVENOUS
  Filled 2012-09-05: qty 1
  Filled 2012-09-05: qty 2
  Filled 2012-09-05 (×7): qty 1

## 2012-09-05 MED ORDER — PANTOPRAZOLE SODIUM 40 MG PO TBEC
40.0000 mg | DELAYED_RELEASE_TABLET | Freq: Every day | ORAL | Status: DC
  Administered 2012-09-07 – 2012-09-10 (×4): 40 mg via ORAL
  Filled 2012-09-05 (×4): qty 1

## 2012-09-05 MED ORDER — VECURONIUM BROMIDE 10 MG IV SOLR
INTRAVENOUS | Status: DC | PRN
  Administered 2012-09-05: 4 mg via INTRAVENOUS
  Administered 2012-09-05: 6 mg via INTRAVENOUS
  Administered 2012-09-05: 4 mg via INTRAVENOUS
  Administered 2012-09-05: 2 mg via INTRAVENOUS
  Administered 2012-09-05: 4 mg via INTRAVENOUS

## 2012-09-05 MED ORDER — ACETAMINOPHEN 500 MG PO TABS
1000.0000 mg | ORAL_TABLET | Freq: Four times a day (QID) | ORAL | Status: DC
  Administered 2012-09-06 – 2012-09-10 (×14): 1000 mg via ORAL
  Filled 2012-09-05 (×19): qty 2

## 2012-09-05 MED ORDER — FENTANYL CITRATE 0.05 MG/ML IJ SOLN
INTRAMUSCULAR | Status: DC | PRN
  Administered 2012-09-05: 750 ug via INTRAVENOUS
  Administered 2012-09-05: 200 ug via INTRAVENOUS
  Administered 2012-09-05: 50 ug via INTRAVENOUS
  Administered 2012-09-05 (×2): 250 ug via INTRAVENOUS

## 2012-09-05 MED ORDER — CALCIUM CHLORIDE 10 % IV SOLN
1.0000 g | Freq: Once | INTRAVENOUS | Status: AC | PRN
  Filled 2012-09-05: qty 10

## 2012-09-05 MED ORDER — DEXMEDETOMIDINE HCL IN NACL 200 MCG/50ML IV SOLN
0.1000 ug/kg/h | INTRAVENOUS | Status: DC
  Administered 2012-09-05: 0.7 ug/kg/h via INTRAVENOUS
  Filled 2012-09-05: qty 50

## 2012-09-05 SURGICAL SUPPLY — 125 items
ADAPTER CARDIO PERF ANTE/RETRO (ADAPTER) IMPLANT
APPLIER CLIP 9.375 MED OPEN (MISCELLANEOUS)
APPLIER CLIP 9.375 SM OPEN (CLIP) ×2
ATTRACTOMAT 16X20 MAGNETIC DRP (DRAPES) ×2 IMPLANT
BAG DECANTER FOR FLEXI CONT (MISCELLANEOUS) ×2 IMPLANT
BANDAGE ELASTIC 4 VELCRO ST LF (GAUZE/BANDAGES/DRESSINGS) ×4 IMPLANT
BANDAGE ELASTIC 6 VELCRO ST LF (GAUZE/BANDAGES/DRESSINGS) ×4 IMPLANT
BANDAGE GAUZE ELAST BULKY 4 IN (GAUZE/BANDAGES/DRESSINGS) ×4 IMPLANT
BASKET HEART (ORDER IN 25'S) (MISCELLANEOUS) ×1
BASKET HEART (ORDER IN 25S) (MISCELLANEOUS) ×1 IMPLANT
BENZOIN TINCTURE PRP APPL 2/3 (GAUZE/BANDAGES/DRESSINGS) IMPLANT
BLADE STERNUM SYSTEM 6 (BLADE) ×2 IMPLANT
BLADE SURG ROTATE 9660 (MISCELLANEOUS) IMPLANT
CANISTER SUCTION 2500CC (MISCELLANEOUS) ×2 IMPLANT
CANNULA GUNDRY RCSP 15FR (MISCELLANEOUS) IMPLANT
CATH CPB KIT OWEN (MISCELLANEOUS) ×2 IMPLANT
CATH THORACIC 28FR (CATHETERS) IMPLANT
CATH THORACIC 28FR RT ANG (CATHETERS) IMPLANT
CATH THORACIC 36FR (CATHETERS) ×2 IMPLANT
CATH THORACIC 36FR RT ANG (CATHETERS) IMPLANT
CLIP APPLIE 9.375 MED OPEN (MISCELLANEOUS) IMPLANT
CLIP APPLIE 9.375 SM OPEN (CLIP) ×1 IMPLANT
CLIP FOGARTY SPRING 6M (CLIP) IMPLANT
CLIP RETRACTION 3.0MM CORONARY (MISCELLANEOUS) ×2 IMPLANT
CLIP TI MEDIUM 24 (CLIP) IMPLANT
CLIP TI WIDE RED SMALL 24 (CLIP) IMPLANT
CLOTH BEACON ORANGE TIMEOUT ST (SAFETY) ×2 IMPLANT
CONN ST 1/4X3/8  BEN (MISCELLANEOUS) ×2
CONN ST 1/4X3/8 BEN (MISCELLANEOUS) ×2 IMPLANT
CONN Y 3/8X3/8X3/8  BEN (MISCELLANEOUS)
CONN Y 3/8X3/8X3/8 BEN (MISCELLANEOUS) IMPLANT
COVER SURGICAL LIGHT HANDLE (MISCELLANEOUS) ×2 IMPLANT
CRADLE DONUT ADULT HEAD (MISCELLANEOUS) ×2 IMPLANT
DERMABOND ADVANCED (GAUZE/BANDAGES/DRESSINGS) ×2
DERMABOND ADVANCED .7 DNX12 (GAUZE/BANDAGES/DRESSINGS) ×2 IMPLANT
DRAIN CHANNEL 32F RND 10.7 FF (WOUND CARE) ×4 IMPLANT
DRAPE CARDIOVASCULAR INCISE (DRAPES) ×1
DRAPE INCISE IOBAN 66X45 STRL (DRAPES) ×2 IMPLANT
DRAPE SLUSH/WARMER DISC (DRAPES) ×2 IMPLANT
DRAPE SRG 135X102X78XABS (DRAPES) ×1 IMPLANT
DRSG COVADERM 4X14 (GAUZE/BANDAGES/DRESSINGS) ×2 IMPLANT
ELECT REM PT RETURN 9FT ADLT (ELECTROSURGICAL) ×4
ELECTRODE REM PT RTRN 9FT ADLT (ELECTROSURGICAL) ×2 IMPLANT
GAUZE XEROFORM 5X9 LF (GAUZE/BANDAGES/DRESSINGS) ×2 IMPLANT
GLOVE BIO SURGEON STRL SZ 6 (GLOVE) ×6 IMPLANT
GLOVE BIO SURGEON STRL SZ 6.5 (GLOVE) ×10 IMPLANT
GLOVE BIO SURGEON STRL SZ7 (GLOVE) IMPLANT
GLOVE BIO SURGEON STRL SZ7.5 (GLOVE) ×4 IMPLANT
GLOVE BIOGEL PI IND STRL 6 (GLOVE) ×3 IMPLANT
GLOVE BIOGEL PI IND STRL 6.5 (GLOVE) ×2 IMPLANT
GLOVE BIOGEL PI IND STRL 7.0 (GLOVE) ×3 IMPLANT
GLOVE BIOGEL PI IND STRL 8.5 (GLOVE) ×1 IMPLANT
GLOVE BIOGEL PI INDICATOR 6 (GLOVE) ×3
GLOVE BIOGEL PI INDICATOR 6.5 (GLOVE) ×2
GLOVE BIOGEL PI INDICATOR 7.0 (GLOVE) ×3
GLOVE BIOGEL PI INDICATOR 8.5 (GLOVE) ×1
GLOVE EUDERMIC 7 POWDERFREE (GLOVE) IMPLANT
GLOVE ORTHO TXT STRL SZ7.5 (GLOVE) ×6 IMPLANT
GOWN PREVENTION PLUS XLARGE (GOWN DISPOSABLE) ×8 IMPLANT
GOWN STRL NON-REIN LRG LVL3 (GOWN DISPOSABLE) ×18 IMPLANT
HEMOSTAT POWDER SURGIFOAM 1G (HEMOSTASIS) ×8 IMPLANT
INSERT FOGARTY 61MM (MISCELLANEOUS) IMPLANT
INSERT FOGARTY XLG (MISCELLANEOUS) ×2 IMPLANT
KIT BASIN OR (CUSTOM PROCEDURE TRAY) ×2 IMPLANT
KIT ROOM TURNOVER OR (KITS) ×2 IMPLANT
KIT SUCTION CATH 14FR (SUCTIONS) ×10 IMPLANT
KIT VASOVIEW W/TROCAR VH 2000 (KITS) ×2 IMPLANT
LEAD PACING MYOCARDI (MISCELLANEOUS) ×2 IMPLANT
MARKER GRAFT CORONARY BYPASS (MISCELLANEOUS) ×6 IMPLANT
NS IRRIG 1000ML POUR BTL (IV SOLUTION) ×12 IMPLANT
PACK OPEN HEART (CUSTOM PROCEDURE TRAY) ×2 IMPLANT
PAD ARMBOARD 7.5X6 YLW CONV (MISCELLANEOUS) ×2 IMPLANT
PAD DEFIB R2 (MISCELLANEOUS) ×2 IMPLANT
PENCIL BUTTON HOLSTER BLD 10FT (ELECTRODE) ×2 IMPLANT
PUNCH AORTIC ROTATE 4.0MM (MISCELLANEOUS) IMPLANT
PUNCH AORTIC ROTATE 4.5MM 8IN (MISCELLANEOUS) ×2 IMPLANT
PUNCH AORTIC ROTATE 5MM 8IN (MISCELLANEOUS) IMPLANT
SET CARDIOPLEGIA MPS 5001102 (MISCELLANEOUS) ×2 IMPLANT
SOLUTION ANTI FOG 6CC (MISCELLANEOUS) IMPLANT
SPONGE GAUZE 4X4 12PLY (GAUZE/BANDAGES/DRESSINGS) ×2 IMPLANT
SPONGE LAP 18X18 X RAY DECT (DISPOSABLE) ×6 IMPLANT
SPONGE LAP 4X18 X RAY DECT (DISPOSABLE) ×2 IMPLANT
SUT BONE WAX W31G (SUTURE) ×2 IMPLANT
SUT ETHIBOND X763 2 0 SH 1 (SUTURE) ×4 IMPLANT
SUT MNCRL AB 3-0 PS2 18 (SUTURE) ×4 IMPLANT
SUT MNCRL AB 4-0 PS2 18 (SUTURE) IMPLANT
SUT PDS AB 1 CTX 36 (SUTURE) ×4 IMPLANT
SUT PROLENE 2 0 SH DA (SUTURE) IMPLANT
SUT PROLENE 3 0 SH DA (SUTURE) ×2 IMPLANT
SUT PROLENE 3 0 SH1 36 (SUTURE) IMPLANT
SUT PROLENE 4 0 RB 1 (SUTURE)
SUT PROLENE 4 0 SH DA (SUTURE) IMPLANT
SUT PROLENE 4-0 RB1 .5 CRCL 36 (SUTURE) IMPLANT
SUT PROLENE 5 0 C 1 36 (SUTURE) IMPLANT
SUT PROLENE 6 0 C 1 30 (SUTURE) ×6 IMPLANT
SUT PROLENE 7.0 RB 3 (SUTURE) ×16 IMPLANT
SUT PROLENE 8 0 BV175 6 (SUTURE) ×18 IMPLANT
SUT PROLENE BLUE 7 0 (SUTURE) ×2 IMPLANT
SUT PROLENE POLY MONO (SUTURE) IMPLANT
SUT SILK  1 MH (SUTURE) ×1
SUT SILK 1 MH (SUTURE) ×1 IMPLANT
SUT STEEL 6MS V (SUTURE) IMPLANT
SUT STEEL STERNAL CCS#1 18IN (SUTURE) IMPLANT
SUT STEEL SZ 6 DBL 3X14 BALL (SUTURE) IMPLANT
SUT VIC AB 1 CTX 18 (SUTURE) ×2 IMPLANT
SUT VIC AB 1 CTX 36 (SUTURE)
SUT VIC AB 1 CTX36XBRD ANBCTR (SUTURE) IMPLANT
SUT VIC AB 2-0 CT1 27 (SUTURE) ×2
SUT VIC AB 2-0 CT1 TAPERPNT 27 (SUTURE) ×2 IMPLANT
SUT VIC AB 2-0 CTX 27 (SUTURE) IMPLANT
SUT VIC AB 3-0 SH 27 (SUTURE)
SUT VIC AB 3-0 SH 27X BRD (SUTURE) IMPLANT
SUT VIC AB 3-0 X1 27 (SUTURE) ×4 IMPLANT
SUT VICRYL 4-0 PS2 18IN ABS (SUTURE) IMPLANT
SUTURE E-PAK OPEN HEART (SUTURE) ×2 IMPLANT
SYSTEM SAHARA CHEST DRAIN ATS (WOUND CARE) ×2 IMPLANT
TAPE CLOTH SURG 4X10 WHT LF (GAUZE/BANDAGES/DRESSINGS) ×2 IMPLANT
TAPE PAPER 2X10 WHT MICROPORE (GAUZE/BANDAGES/DRESSINGS) ×2 IMPLANT
TOWEL OR 17X24 6PK STRL BLUE (TOWEL DISPOSABLE) ×4 IMPLANT
TOWEL OR 17X26 10 PK STRL BLUE (TOWEL DISPOSABLE) ×4 IMPLANT
TRAY FOLEY IC TEMP SENS 14FR (CATHETERS) ×2 IMPLANT
TUBE SUCT INTRACARD DLP 20F (MISCELLANEOUS) ×2 IMPLANT
TUBING INSUFFLATION 10FT LAP (TUBING) ×2 IMPLANT
UNDERPAD 30X30 INCONTINENT (UNDERPADS AND DIAPERS) ×2 IMPLANT
WATER STERILE IRR 1000ML POUR (IV SOLUTION) ×4 IMPLANT

## 2012-09-05 NOTE — Transfer of Care (Signed)
Immediate Anesthesia Transfer of Care Note  Patient: [name]  Procedure(s) Performed: Procedure(s) (LRB) with comments: CORONARY ARTERY BYPASS GRAFTING (CABG) (N/A) - CABG x four, using left internal mammary artery and bilateral greater saphenous vein harvested endoscopically INTRAOPERATIVE TRANSESOPHAGEAL ECHOCARDIOGRAM (N/A)  Patient Location: SICU  Anesthesia Type:General  Level of Consciousness: sedated and unresponsive  Airway & Oxygen Therapy: Patient remains intubated per anesthesia plan and Patient placed on Ventilator (see vital sign flow sheet for setting)  Post-op Assessment: Report given to PACU RN and Post -op Vital signs reviewed and stable  Post vital signs: Reviewed and stable  Complications: No apparent anesthesia complications

## 2012-09-05 NOTE — Brief Op Note (Addendum)
                   [address]            [place] [address]          [phone]    09/05/2012  12:03 PM  PATIENT:  [name]  78 y.o. female  PRE-OPERATIVE DIAGNOSIS:  CAD  POST-OPERATIVE DIAGNOSIS:  CAD  PROCEDURE:  Procedure(s): CORONARY ARTERY BYPASS GRAFTING (CABG)X 4 LIMA-LAD; SVG-OM1; SVG-PDA; SVG-DIAG INTRAOPERATIVE TRANSESOPHAGEAL ECHOCARDIOGRAM EVH BILAT THIGH  SURGEON:    Rexene Alberts, MD  ASSISTANTS:  Jadene Pierini, PA-C  ANESTHESIA:   Midge Minium, MD  CROSSCLAMP TIME:   46'  CARDIOPULMONARY BYPASS TIME: 104'  FINDINGS:  Normal LV function  Good quality LIMA and SVG conduit  Small caliber poor quality target vessels for grafting  PRE-OPERATIVE WEIGHT: 0000000  COMPLICATIONS: none  PATIENT DISPOSITION:   TO SICU IN STABLE CONDITION  OWEN,CLARENCE H 09/05/2012 1:06 PM

## 2012-09-05 NOTE — Preoperative (Signed)
Beta Blockers   Reason not to administer Beta Blockers:Not Applicable 

## 2012-09-05 NOTE — Anesthesia Procedure Notes (Signed)
Procedure Name: Intubation Date/Time: 09/05/2012 8:02 AM Performed by: Greggory Stallion, Warren Lindahl L Pre-anesthesia Checklist: Patient identified, Suction available, Patient being monitored and Emergency Drugs available Patient Re-evaluated:Patient Re-evaluated prior to inductionOxygen Delivery Method: Circle system utilized Preoxygenation: Pre-oxygenation with 100% oxygen Intubation Type: IV induction Ventilation: Mask ventilation without difficulty Laryngoscope Size: Mac and 3 Grade View: Grade II Tube type: Oral Number of attempts: 1 Airway Equipment and Method: Stylet Placement Confirmation: ETT inserted through vocal cords under direct vision,  positive ETCO2 and breath sounds checked- equal and bilateral Secured at: 21 cm Tube secured with: Tape Dental Injury: Teeth and Oropharynx as per pre-operative assessment

## 2012-09-05 NOTE — Op Note (Signed)
CARDIOTHORACIC SURGERY OPERATIVE NOTE  Date of Procedure: 09/05/2012  Preoperative Diagnosis: Severe 3-vessel Coronary Artery Disease  Postoperative Diagnosis: Same  Procedure:   Coronary Artery Bypass Grafting x 4  Left Internal Mammary Artery to Distal Left Anterior Descending Coronary Artery Saphenous Vein Graft to Posterior Descending Coronary Artery Saphenous Vein Graft to First Obtuse Marginal Branch of Left Circumflex Coronary Artery Sapheonous Vein Graft to Diagonal Branch Coronary Artery (lateral sub-branch) Endoscopic Vein Harvest from Bilateral Thighs  Surgeon: Valentina Gu. Roxy Manns, MD  Assistant: Jadene Pierini, PA-C  Anesthesia: Midge Minium, MD  Operative Findings: Normal LV function  Good quality LIMA and SVG conduit  Small caliber poor quality target vessels for grafting     BRIEF CLINICAL NOTE AND INDICATIONS FOR SURGERY  Patient is a 78 year old female from [place] no previous history of coronary artery disease but risk factors notable for history of hypertension, type 2 diabetes mellitus, hyperlipidemia, a family history of coronary artery disease and a remote history of tobacco use. The patient describes a greater than one-year history of tightness in her throat which is brought on with physical activity and relieved by rest. She states that she first experienced these symptoms more than a year ago and typically occur when the patient is moaning her longer doing something reasonably strenuous. 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 She did not undergo adjuvant chemotherapy.  During that time she was less active physically and she states that she hasn't really noticed her problems with tightness in her throat. However, this past summer she seemed to get her energy back in with added physical activity she began to experience similar symptoms but also became  associated with exertional shortness of breath. He was seen by her otolaryngologist and subsequently referred to Dr. Rockey Situ because of concerns that her symptoms might represent angina pectoris. Over the past few months her symptoms have seemed to get some worsening she occasionally gets mild tightness in her throat using when she has not physically active, although for the most part her symptoms occur only with physical activity. Symptoms are usually relieved within 15-20 minutes of rest, and she has never had any particularly prolonged episodes of discomfort. She has not had any pain in her chest, arm, nor back.  She underwent cardiac catheterization last week and was found to have 70% stenosis of the left main coronary artery with severe three-vessel coronary artery disease and preserved left ventricular function. The patient has now been referred for possible elective surgical revascularization.       DETAILS OF THE OPERATIVE PROCEDURE  The patient is brought to the operating room on the above mentioned date and central monitoring was established by the anesthesia team including placement of Swan-Ganz catheter and radial arterial line. The patient is placed in the supine position on the operating table.  Intravenous antibiotics are administered. General endotracheal anesthesia is induced uneventfully. A Foley catheter is placed.  Baseline transesophageal echocardiogram was performed.  Findings were notable for normal LV size and systolic function.  The patient's chest, abdomen, both groins, and both lower extremities are prepared and draped in a sterile manner. A time out procedure is performed.  A median sternotomy incision was performed and the left internal mammary artery is dissected from the chest wall and prepared for bypass grafting. The left internal mammary artery is notably good quality conduit. Simultaneously, saphenous vein is obtained from the patient's right and left  thighs using  endoscopic vein harvest technique. The saphenous vein is notably good quality conduit. After removal of the saphenous vein, the small surgical incisions in the lower extremity are closed with absorbable suture. Following systemic heparinization, the left internal mammary artery was transected distally noted to have excellent flow.  The pericardium is opened. The ascending aorta is normal in appearance. The ascending aorta and the right atrium are cannulated for cardioplegia bypass.  Adequate heparinization is verified.      The entire pre-bypass portion of the operation was notable for stable hemodynamics.  Cardiopulmonary bypass was begun and the surface of the heart is inspected. Distal target vessels are selected for coronary artery bypass grafting. A cardioplegia cannula is placed in the ascending aorta.  A temperature probe was placed in the interventricular septum.  The patient is allowed to cool passively to [facility] systemic temperature.  The aortic cross clamp is applied and cold blood cardioplegia is delivered initially in an antegrade fashion through the aortic root.  Iced saline slush is applied for topical hypothermia.  The initial cardioplegic arrest is rapid with early diastolic arrest.  Repeat doses of cardioplegia are administered intermittently throughout the entire cross clamp portion of the operation through the aortic root and through subsequently placed vein grafts in order to maintain completely flat electrocardiogram and septal myocardial temperature below 15C.  Myocardial protection was felt to be excellent.  The following distal coronary artery bypass grafts were performed:   The posterior descending branch of the right coronary artery was grafted using a reversed saphenous vein graft in an end-to-side fashion.  At the site of distal anastomosis the target vessel was fair quality and measured approximately 1.5 mm in diameter.  The first obtuse marginal branch of the left  circumflex coronary artery was grafted using a reversed saphenous vein graft in an end-to-side fashion.  At the site of distal anastomosis the target vessel was very poor quality and measured approximately 1.0 mm in diameter.  The lateral sub-branch of the first diagonal branch of the left anterior descending coronary artery was grafted using a reversed saphenous vein graft in an end-to-side fashion.  At the site of distal anastomosis the target vessel was poor quality and measured approximately 1.0 mm in diameter.  The distal left anterior coronary artery was grafted with the left internal mammary artery in an end-to-side fashion.  At the site of distal anastomosis the target vessel was poor quality and measured approximately 1.2 mm in diameter.   All proximal vein graft anastomoses were placed directly to the ascending aorta prior to removal of the aortic cross clamp.  The septal myocardial temperature rose rapidly after reperfusion of the left internal mammary artery graft.  The aortic cross clamp was removed after a total cross clamp time of 88 minutes.  All proximal and distal coronary anastomoses were inspected for hemostasis and appropriate graft orientation. Epicardial pacing wires are fixed to the right ventricular outflow tract and to the right atrial appendage. The patient is rewarmed to 37C temperature. The patient is weaned and disconnected from cardiopulmonary bypass.  The patient's rhythm at separation from bypass was AV paced.  The patient was weaned from cardioplegic bypass without any inotropic support. Total cardiopulmonary bypass time for the operation was 104 minutes.  Followup transesophageal echocardiogram performed after separation from bypass revealed no changes from the preoperative exam.  The aortic and venous cannula were removed uneventfully. Protamine was administered to reverse the anticoagulation. The mediastinum and pleural space were inspected  for hemostasis and  irrigated with saline solution. The mediastinum and the left pleural space were drained using 3 chest tubes placed through separate stab incisions inferiorly.  The soft tissues anterior to the aorta were reapproximated loosely. The sternum is closed with double strength sternal wire. The soft tissues anterior to the sternum were closed in multiple layers and the skin is closed with a running subcuticular skin closure.  The post-bypass portion of the operation was notable for stable rhythm and hemodynamics.  No blood products were administered during the operation.  The patient tolerated the procedure well and is transported to the surgical intensive care in stable condition. There are no intraoperative complications. All sponge instrument and needle counts are verified correct at completion of the operation.    Valentina Gu. Roxy Manns MD 09/05/2012 1:12 PM

## 2012-09-05 NOTE — Anesthesia Preprocedure Evaluation (Addendum)
Anesthesia Evaluation  Patient identified by MRN, date of birth, ID band Patient awake    Reviewed: Allergy & Precautions, H&P , NPO status , Patient's Chart, lab work & pertinent test results, reviewed documented beta blocker date and time   History of Anesthesia Complications (+) PONV  Airway Mallampati: II TM Distance: >3 FB Neck ROM: Full    Dental  (+) Edentulous Upper, Poor Dentition, Loose and Dental Advisory Given   Pulmonary shortness of breath and with exertion, pneumonia -, resolved, former smoker (quit 1990's),  breath sounds clear to auscultation  Pulmonary exam normal       Cardiovascular hypertension, Pt. on medications and Pt. on home beta blockers + angina with exertion + CAD (70% L main and 3v ASCADz, EF >60%) Rhythm:Regular Rate:Normal  Ef 60%    Neuro/Psych  Headaches, negative psych ROS   GI/Hepatic Neg liver ROS, GERD-  Controlled,  Endo/Other  diabetes (glu 173), Well Controlled, Type 2, Oral Hypoglycemic AgentsMorbid obesity  Renal/GU negative Renal ROS     Musculoskeletal   Abdominal (+) + obese,   Peds  Hematology   Anesthesia Other Findings   Reproductive/Obstetrics                          Anesthesia Physical Anesthesia Plan  ASA: III  Anesthesia Plan: General   Post-op Pain Management:    Induction: Intravenous  Airway Management Planned: Oral ETT  Additional Equipment: Arterial line, CVP, PA Cath and TEE  Intra-op Plan:   Post-operative Plan: Post-operative intubation/ventilation  Informed Consent: I have reviewed the patients History and Physical, chart, labs and discussed the procedure including the risks, benefits and alternatives for the proposed anesthesia with the patient or authorized representative who has indicated his/her understanding and acceptance.   Dental advisory given  Plan Discussed with: CRNA, Anesthesiologist and  Surgeon  Anesthesia Plan Comments: (Hx  Of Nissan fundoplication for GERD, EF -60%, NIDDM, breast CA Tx. c radiation  Plan routine monitors, A line, PA cath, GETA and TEE, post op ventilation)       Anesthesia Quick Evaluation

## 2012-09-05 NOTE — Procedures (Addendum)
Extubation Procedure Note  Patient Details:   Name: [name] DOB: [birth year] MRN: [id number]   Airway Documentation:     Evaluation  O2 sats: stable throughout and currently acceptable Complications: No apparent complications Patient did tolerate procedure well. Bilateral Breath Sounds: Clear   Yes Pt awake and alert. Extubated per protocol, placed on 3L [place], sat 98%. Positive cuff leak, NIF-20, VC 650, BBS cl. IS 500.  Pt able to vocalize.  Vallery Sa 09/05/2012, 6:07 PM

## 2012-09-05 NOTE — OR Nursing (Signed)
12:15 - 1st call to SICU, call to vol. Desk to inform family off pump.  2nd call to SICU @ 12:45

## 2012-09-05 NOTE — Interval H&P Note (Signed)
History and Physical Interval Note:  09/05/2012 7:09 AM  [name]  has presented today for surgery, with the diagnosis of CAD  The various methods of treatment have been discussed with the patient and family. After consideration of risks, benefits and other options for treatment, the patient has consented to  Procedure(s) (LRB) with comments: CORONARY ARTERY BYPASS GRAFTING (CABG) (N/A) INTRAOPERATIVE TRANSESOPHAGEAL ECHOCARDIOGRAM (N/A) as a surgical intervention .  The patient's history has been reviewed, patient examined, no change in status, stable for surgery.  I have reviewed the patient's chart and labs.  Questions were answered to the patient's satisfaction.     Kerri Asche H

## 2012-09-05 NOTE — Progress Notes (Signed)
Patient ID: [name], female   DOB: 14-May-1934, 78 y.o.   MRN: [id number]  SICU Evening Rounds:  Hemodynamically stable. A-paced at 41 About reading for extubation Urine output good. CT output low. CBC    Component Value Date/Time   WBC 12.1* 09/05/2012 1339   WBC 6.2 08/19/2012 1007   RBC 3.39* 09/05/2012 1339   RBC 4.71 08/19/2012 1007   HGB 10.2* 09/05/2012 1343   HCT 30.0* 09/05/2012 1343   PLT 161 09/05/2012 1339   MCV 85.5 09/05/2012 1339   MCH 28.9 09/05/2012 1339   MCH 30.8 08/19/2012 1007   MCHC 33.8 09/05/2012 1339   MCHC 35.6 08/19/2012 1007   RDW 13.9 09/05/2012 1339   RDW 14.6 08/19/2012 1007   LYMPHSABS 2.0 08/19/2012 1007   EOSABS 0.2 08/19/2012 1007   BASOSABS 0.1 08/19/2012 1007    BMET    Component Value Date/Time   NA 139 09/05/2012 1343   NA 136 08/19/2012 1007   K 3.4* 09/05/2012 1343   CL 101 09/03/2012 1355   CO2 23 09/03/2012 1355   GLUCOSE 122* 09/05/2012 1343   GLUCOSE 145* 08/19/2012 1007   BUN 11 09/03/2012 1355   BUN 14 08/19/2012 1007   CREATININE 0.84 09/03/2012 1355   CALCIUM 9.3 09/03/2012 1355   GFRNONAA 66* 09/03/2012 1355   GFRAA 76* 09/03/2012 1355    A/P:  Stable postop course.

## 2012-09-06 ENCOUNTER — Inpatient Hospital Stay (HOSPITAL_COMMUNITY): Payer: Medicare Other

## 2012-09-06 ENCOUNTER — Encounter (HOSPITAL_COMMUNITY): Payer: Self-pay | Admitting: Thoracic Surgery (Cardiothoracic Vascular Surgery)

## 2012-09-06 LAB — GLUCOSE, CAPILLARY
Glucose-Capillary: 101 mg/dL — ABNORMAL HIGH (ref 70–99)
Glucose-Capillary: 113 mg/dL — ABNORMAL HIGH (ref 70–99)
Glucose-Capillary: 115 mg/dL — ABNORMAL HIGH (ref 70–99)
Glucose-Capillary: 122 mg/dL — ABNORMAL HIGH (ref 70–99)
Glucose-Capillary: 123 mg/dL — ABNORMAL HIGH (ref 70–99)
Glucose-Capillary: 124 mg/dL — ABNORMAL HIGH (ref 70–99)
Glucose-Capillary: 124 mg/dL — ABNORMAL HIGH (ref 70–99)
Glucose-Capillary: 126 mg/dL — ABNORMAL HIGH (ref 70–99)
Glucose-Capillary: 132 mg/dL — ABNORMAL HIGH (ref 70–99)
Glucose-Capillary: 144 mg/dL — ABNORMAL HIGH (ref 70–99)
Glucose-Capillary: 157 mg/dL — ABNORMAL HIGH (ref 70–99)

## 2012-09-06 LAB — CBC
HCT: 29.6 % — ABNORMAL LOW (ref 36.0–46.0)
HCT: 32.8 % — ABNORMAL LOW (ref 36.0–46.0)
Hemoglobin: 9.9 g/dL — ABNORMAL LOW (ref 12.0–15.0)
Hemoglobin: 11 g/dL — ABNORMAL LOW (ref 12.0–15.0)
MCH: 28.6 pg (ref 26.0–34.0)
MCH: 28.9 pg (ref 26.0–34.0)
MCHC: 33.4 g/dL (ref 30.0–36.0)
MCHC: 33.5 g/dL (ref 30.0–36.0)
MCV: 85.5 fL (ref 78.0–100.0)
MCV: 86.1 fL (ref 78.0–100.0)
Platelets: 198 10*3/uL (ref 150–400)
Platelets: 204 10*3/uL (ref 150–400)
RBC: 3.46 MIL/uL — ABNORMAL LOW (ref 3.87–5.11)
RBC: 3.81 MIL/uL — ABNORMAL LOW (ref 3.87–5.11)
RDW: 14.2 % (ref 11.5–15.5)
RDW: 14.3 % (ref 11.5–15.5)
WBC: 13.8 10*3/uL — ABNORMAL HIGH (ref 4.0–10.5)
WBC: 14.3 10*3/uL — ABNORMAL HIGH (ref 4.0–10.5)

## 2012-09-06 LAB — BASIC METABOLIC PANEL
BUN: 7 mg/dL (ref 6–23)
CO2: 24 mEq/L (ref 19–32)
Calcium: 8.3 mg/dL — ABNORMAL LOW (ref 8.4–10.5)
Chloride: 100 mEq/L (ref 96–112)
Creatinine, Ser: 0.6 mg/dL (ref 0.50–1.10)
GFR calc Af Amer: >90 mL/min (ref >90)
GFR calc non Af Amer: 86 mL/min — ABNORMAL LOW (ref >90)
Glucose, Bld: 130 mg/dL — ABNORMAL HIGH (ref 70–99)
Potassium: 4.4 mEq/L (ref 3.5–5.1)
Sodium: 133 mEq/L — ABNORMAL LOW (ref 135–145)

## 2012-09-06 LAB — POCT I-STAT, CHEM 8
BUN: 7 mg/dL (ref 6–23)
Calcium, Ion: 1.18 mmol/L (ref 1.13–1.30)
Chloride: 97 mEq/L (ref 96–112)
Creatinine, Ser: 0.8 mg/dL (ref 0.50–1.10)
Glucose, Bld: 170 mg/dL — ABNORMAL HIGH (ref 70–99)
HCT: 33 % — ABNORMAL LOW (ref 36.0–46.0)
Hemoglobin: 11.2 g/dL — ABNORMAL LOW (ref 12.0–15.0)
Potassium: 4.5 mEq/L (ref 3.5–5.1)
Sodium: 134 mEq/L — ABNORMAL LOW (ref 135–145)
TCO2: 25 mmol/L (ref 0–100)

## 2012-09-06 LAB — CREATININE, SERUM
Creatinine, Ser: 0.66 mg/dL (ref 0.50–1.10)
GFR calc Af Amer: >90 mL/min (ref >90)
GFR calc non Af Amer: 84 mL/min — ABNORMAL LOW (ref >90)

## 2012-09-06 LAB — MAGNESIUM
Magnesium: 2 mg/dL (ref 1.5–2.5)
Magnesium: 1.8 mg/dL (ref 1.5–2.5)

## 2012-09-06 MED ORDER — INSULIN ASPART 100 UNIT/ML ~~LOC~~ SOLN
0.0000 [IU] | SUBCUTANEOUS | Status: DC
  Administered 2012-09-06 – 2012-09-07 (×6): 2 [IU] via SUBCUTANEOUS
  Administered 2012-09-07: 4 [IU] via SUBCUTANEOUS
  Administered 2012-09-07 – 2012-09-08 (×2): 2 [IU] via SUBCUTANEOUS
  Administered 2012-09-08: 4 [IU] via SUBCUTANEOUS
  Administered 2012-09-08: 12 [IU] via SUBCUTANEOUS
  Administered 2012-09-08: 2 [IU] via SUBCUTANEOUS

## 2012-09-06 MED ORDER — INSULIN GLARGINE 100 UNIT/ML ~~LOC~~ SOLN
20.0000 [IU] | Freq: Two times a day (BID) | SUBCUTANEOUS | Status: DC
  Administered 2012-09-06 – 2012-09-08 (×5): 20 [IU] via SUBCUTANEOUS

## 2012-09-06 MED ORDER — MIDAZOLAM HCL 2 MG/2ML IJ SOLN
2.0000 mg | Freq: Once | INTRAMUSCULAR | Status: AC
  Administered 2012-09-06: 2 mg via INTRAVENOUS
  Filled 2012-09-06: qty 2

## 2012-09-06 MED ORDER — PROMETHAZINE HCL 25 MG/ML IJ SOLN
12.5000 mg | INTRAMUSCULAR | Status: DC | PRN
  Administered 2012-09-06 – 2012-09-07 (×4): 12.5 mg via INTRAVENOUS
  Filled 2012-09-06 (×4): qty 1

## 2012-09-06 MED ORDER — MORPHINE SULFATE 2 MG/ML IJ SOLN
2.0000 mg | INTRAMUSCULAR | Status: DC | PRN
  Administered 2012-09-06 – 2012-09-08 (×4): 2 mg via INTRAVENOUS
  Filled 2012-09-06 (×4): qty 1

## 2012-09-06 MED ORDER — METOPROLOL TARTRATE 25 MG PO TABS
25.0000 mg | ORAL_TABLET | Freq: Two times a day (BID) | ORAL | Status: DC
  Administered 2012-09-06 – 2012-09-08 (×6): 25 mg via ORAL
  Filled 2012-09-06 (×8): qty 1

## 2012-09-06 MED ORDER — CLONIDINE HCL 0.2 MG/24HR TD PTWK
0.2000 mg | MEDICATED_PATCH | TRANSDERMAL | Status: DC
  Administered 2012-09-06: 0.2 mg via TRANSDERMAL
  Filled 2012-09-06: qty 1

## 2012-09-06 MED FILL — Magnesium Sulfate Inj 50%: INTRAMUSCULAR | Qty: 10 | Status: AC

## 2012-09-06 MED FILL — Dexmedetomidine HCl IV Soln 200 MCG/2ML: INTRAVENOUS | Qty: 2 | Status: AC

## 2012-09-06 MED FILL — Potassium Chloride Inj 2 mEq/ML: INTRAVENOUS | Qty: 40 | Status: AC

## 2012-09-06 NOTE — Anesthesia Postprocedure Evaluation (Signed)
  Anesthesia Post-op Note  Patient: [name]  Procedure(s) Performed: Procedure(s) (LRB) with comments: CORONARY ARTERY BYPASS GRAFTING (CABG) (N/A) - CABG x four, using left internal mammary artery and bilateral greater saphenous vein harvested endoscopically INTRAOPERATIVE TRANSESOPHAGEAL ECHOCARDIOGRAM (N/A)  Patient Location: SICU  Anesthesia Type:General  Level of Consciousness: awake  Airway and Oxygen Therapy: Patient Spontanous Breathing and Patient connected to nasal cannula oxygen  Post-op Pain: none  Post-op Assessment: Post-op Vital signs reviewed, Patient's Cardiovascular Status Stable, Respiratory Function Stable, Patent Airway, No signs of Nausea or vomiting, Adequate PO intake and Pain level controlled  Post-op Vital Signs: Reviewed and stable  Complications: No apparent anesthesia complications

## 2012-09-06 NOTE — Progress Notes (Addendum)
   CARDIOTHORACIC SURGERY PROGRESS NOTE   R1 Day Post-Op Procedure(s) (LRB): CORONARY ARTERY BYPASS GRAFTING (CABG) (N/A) INTRAOPERATIVE TRANSESOPHAGEAL ECHOCARDIOGRAM (N/A)  Subjective: Feels nauseated but otherwise okay.  Can't belch or throw up because of Nissen fundoplication.  Zofran hasn't helped much.  Mild soreness in chest.  Breathing comfortably.  Objective: Vital signs: BP Readings from Last 1 Encounters:  09/06/12 138/65   Pulse Readings from Last 1 Encounters:  09/06/12 88   Resp Readings from Last 1 Encounters:  09/06/12 19   Temp Readings from Last 1 Encounters:  09/06/12 98.8 F (37.1 C)     Hemodynamics: PAP: (17-37)/(8-19) 34/19 mmHg CO:  [2.7 L/min-6.2 L/min] 5.8 L/min CI:  [1.2 L/min/m2-3 L/min/m2] 2.8 L/min/m2  Physical Exam:  Rhythm:   sinus  Breath sounds: clear  Heart sounds:  RRR  Incisions:  Dressings dry, intact  Abdomen:  Soft, non-distended, non-tender  Extremities:  Warm, well perfused   Intake/Output from previous day: 12/05 0701 - 12/06 0700 In: 6305.9 [P.O.:60; I.V.:4293.9; Blood:300; NG/GT:30; IV Piggyback:1622] Out: [id number] [Urine:3530; Blood:1550; Chest Tube:450] Intake/Output this shift:    Lab Results:  Basename 09/06/12 0345 09/05/12 1935  WBC 13.8* 14.9*  HGB 9.9* 9.9*  HCT 29.6* 29.0*  PLT 198 156   BMET:  Basename 09/06/12 0345 09/05/12 1935 09/05/12 1933 09/03/12 1355  NA 133* -- 137 --  K 4.4 -- 4.3 --  CL 100 -- 104 --  CO2 24 -- -- 23  GLUCOSE 130* -- 151* --  BUN 7 -- 8 --  CREATININE 0.60 0.66 -- --  CALCIUM 8.3* -- -- 9.3    CBG (last 3)   Basename 09/06/12 0642 09/06/12 0545 09/06/12 0443  GLUCAP 124* 113* 123*   ABG    Component Value Date/Time   PHART 7.385 09/05/2012 1933   HCO3 23.8 09/05/2012 1933   TCO2 25 09/05/2012 1933   TCO2 22 09/05/2012 1933   ACIDBASEDEF 1.0 09/05/2012 1933   O2SAT 96.0 09/05/2012 1933   CXR: Mild bibasilar atelectasis and venous congestion, overall looks  good  Assessment/Plan: S/P Procedure(s) (LRB): CORONARY ARTERY BYPASS GRAFTING (CABG) (N/A) INTRAOPERATIVE TRANSESOPHAGEAL ECHOCARDIOGRAM (N/A)  Doing well POD1 Expected post op acute blood loss anemia, mild, stable Expected post op volume excess, mild Type II diabetes mellitus, excellent glycemic control but still on insulin drip Postop nausea Hypertension   mobilize  Diuresis  D/C tubes and lines  Add phenergan for nausea  Restart clonidine at preop dose using transdermal patch  Start lantus insulin and wean insulin drip as tolerated    Gerhard Rappaport H 09/06/2012 7:59 AM

## 2012-09-06 NOTE — Progress Notes (Signed)
Patient ID: [name], female   DOB: 01-16-1934, 78 y.o.   MRN: [id number]                   [address]            [place] [address]          [phone]     1 Day Post-Op Procedure(s) (LRB): CORONARY ARTERY BYPASS GRAFTING (CABG) (N/A) INTRAOPERATIVE TRANSESOPHAGEAL ECHOCARDIOGRAM (N/A)  Total Length of Stay:  LOS: 1 day  BP 158/68  Pulse 98  Temp 99.3 F (37.4 C) (Oral)  Resp 21  Ht 5\' 5"  (1.651 m)  Wt 228 lb 13.4 oz (103.8 kg)  BMI 38.08 kg/m2  SpO2 96%  .Intake/Output      12/05 0701 - 12/06 0700 12/06 0701 - 12/07 0700   P.O. 60 120   I.V. (mL/kg) 4319.1 (41.6) 315.6 (3)   Blood 300    NG/GT 30    IV Piggyback 1622 50   Total Intake(mL/kg) 6331.1 (61) 485.6 (4.7)   Urine (mL/kg/hr) 3530 (1.4) 610 (0.5)   Blood 1550    Chest Tube 450 100   Total Output 5530 710   Net +801.1 -224.4             . sodium chloride 20 mL/hr at 09/05/12 1425  . sodium chloride 20 mL/hr at 09/05/12 1400  . sodium chloride    . nitroGLYCERIN 10 mcg/min (09/06/12 1300)  . [DISCONTINUED] dexmedetomidine Stopped (09/05/12 1733)  . [DISCONTINUED] insulin (NOVOLIN-R) infusion 5.3 mL/hr at 09/06/12 0800  . [DISCONTINUED] lactated ringers 20 mL/hr at 09/05/12 1621  . [DISCONTINUED] phenylephrine (NEO-SYNEPHRINE) Adult infusion       Lab Results  Component Value Date   WBC 14.3* 09/06/2012   HGB 11.0* 09/06/2012   HCT 32.8* 09/06/2012   PLT 204 09/06/2012   GLUCOSE 170* 09/06/2012   ALT 11 09/03/2012   AST 17 09/03/2012   NA 134* 09/06/2012   K 4.5 09/06/2012   CL 97 09/06/2012   CREATININE 0.66 09/06/2012   BUN 7 09/06/2012   CO2 24 09/06/2012   INR 1.32 09/05/2012   HGBA1C 6.8* 09/03/2012   Bp[ better now Some nausea improved with phenagren  Grace Isaac MD  Beeper [phone] Office [phone] 09/06/2012 6:30 PM

## 2012-09-07 ENCOUNTER — Inpatient Hospital Stay (HOSPITAL_COMMUNITY): Payer: Medicare Other

## 2012-09-07 DIAGNOSIS — I6340 Cerebral infarction due to embolism of unspecified cerebral artery: Secondary | ICD-10-CM

## 2012-09-07 DIAGNOSIS — G8190 Hemiplegia, unspecified affecting unspecified side: Secondary | ICD-10-CM

## 2012-09-07 LAB — BASIC METABOLIC PANEL
BUN: 8 mg/dL (ref 6–23)
CO2: 29 mEq/L (ref 19–32)
Calcium: 8.3 mg/dL — ABNORMAL LOW (ref 8.4–10.5)
Chloride: 96 mEq/L (ref 96–112)
Creatinine, Ser: 0.62 mg/dL (ref 0.50–1.10)
GFR calc Af Amer: >90 mL/min (ref >90)
GFR calc non Af Amer: 85 mL/min — ABNORMAL LOW (ref >90)
Glucose, Bld: 170 mg/dL — ABNORMAL HIGH (ref 70–99)
Potassium: 4.1 mEq/L (ref 3.5–5.1)
Sodium: 131 mEq/L — ABNORMAL LOW (ref 135–145)

## 2012-09-07 LAB — GLUCOSE, CAPILLARY
Glucose-Capillary: 134 mg/dL — ABNORMAL HIGH (ref 70–99)
Glucose-Capillary: 136 mg/dL — ABNORMAL HIGH (ref 70–99)
Glucose-Capillary: 144 mg/dL — ABNORMAL HIGH (ref 70–99)
Glucose-Capillary: 146 mg/dL — ABNORMAL HIGH (ref 70–99)
Glucose-Capillary: 147 mg/dL — ABNORMAL HIGH (ref 70–99)
Glucose-Capillary: 167 mg/dL — ABNORMAL HIGH (ref 70–99)

## 2012-09-07 LAB — CBC
HCT: 31.9 % — ABNORMAL LOW (ref 36.0–46.0)
Hemoglobin: 10.5 g/dL — ABNORMAL LOW (ref 12.0–15.0)
MCH: 28.5 pg (ref 26.0–34.0)
MCHC: 32.9 g/dL (ref 30.0–36.0)
MCV: 86.4 fL (ref 78.0–100.0)
Platelets: 193 10*3/uL (ref 150–400)
RBC: 3.69 MIL/uL — ABNORMAL LOW (ref 3.87–5.11)
RDW: 14.4 % (ref 11.5–15.5)
WBC: 11.8 10*3/uL — ABNORMAL HIGH (ref 4.0–10.5)

## 2012-09-07 MED ORDER — FUROSEMIDE 10 MG/ML IJ SOLN
40.0000 mg | Freq: Once | INTRAMUSCULAR | Status: AC
  Administered 2012-09-07: 40 mg via INTRAVENOUS
  Filled 2012-09-07: qty 4

## 2012-09-07 MED ORDER — ENOXAPARIN SODIUM 30 MG/0.3ML ~~LOC~~ SOLN
30.0000 mg | SUBCUTANEOUS | Status: DC
  Administered 2012-09-07 – 2012-09-09 (×3): 30 mg via SUBCUTANEOUS
  Filled 2012-09-07 (×4): qty 0.3

## 2012-09-07 NOTE — Consult Note (Signed)
Referring Physician: Servando Snare    Chief Complaint: Right sided weakness  HPI: [name] is an 78 y.o. female who underwent CABG on 09/05/12.  On today was able to ambulate with assistance this morning.  Was checked by nursing at 0815 and was noted at that time to be leaning to the right and have right sided weakness.  Consult was called at that time.  Patient has pacing wires in place and will not bea ble to have a MRI.  Is allergic to contrast dye.    LSN: [id number] tPA Given: No: Recent CABG  Past Medical History  Diagnosis Date  . Breast mass, right   . Vertigo   . Thyroid disease   . History of breast cancer     39 treatments of radiation. Negative chemo.  . Diabetes   . GERD (gastroesophageal reflux disease)   . Hyperlipemia   . History of seasonal allergies   . Arthritis   . Angina pectoris 08/26/2012  . Complication of anesthesia   . PONV (postoperative nausea and vomiting)   . Hypertension     sees Dr. Fulton Reek  . Coronary artery disease 08/22/2012    sees Dr Rockey Situ  . Pneumonia     hx of  . Headache     migraines  . Cancer     breast cancer, right side  . Gastritis     hx of  . S/P CABG x 4 09/05/2012    LIMA to LAD, SVG to Diag, SVG to OM1, SVG to PDA, EVH from bilateral thighs    Past Surgical History  Procedure Date  . Abdominal hysterectomy   . Ovarian cyst removal   . Back surgery   . Appendectomy   . Breast surgery   . Laparoscopic nissen fundoplication   . Coronary artery bypass graft 09/05/2012    Procedure: CORONARY ARTERY BYPASS GRAFTING (CABG);  Surgeon: Rexene Alberts, MD;  Location: [facility];  Service: Open Heart Surgery;  Laterality: N/A;  CABG x four, using left internal mammary artery and bilateral greater saphenous vein harvested endoscopically  . Intraoperative transesophageal echocardiogram 09/05/2012    Procedure: INTRAOPERATIVE TRANSESOPHAGEAL ECHOCARDIOGRAM;  Surgeon: Rexene Alberts, MD;  Location: [facility];  Service: Open Heart Surgery;   Laterality: N/A;    Family History  Problem Relation Age of Onset  . Heart disease Brother     CABG & stents  . Hyperlipidemia Brother   . Hypertension Brother   . Cancer Father   . Heart disease Mother    Social History:  reports that she quit smoking about 23 years ago. Her smoking use included Cigarettes. She does not have any smokeless tobacco history on file. She reports that she does not drink alcohol or use illicit drugs.  Allergies:  Allergies  Allergen Reactions  . Ciprocinonide (Fluocinolone)     Feels crazy  . Darvocet (Propoxyphene-Acetaminophen)     Difficulty breathing and swallowing  . Ivp Dye (Iodinated Diagnostic Agents)     Nausea & vomiting  . Other     Reports all cholesterol medication  . Statins     Muscle aches    Medications:  I have reviewed the patient's current medications. Scheduled:   . acetaminophen  1,000 mg Oral Q6H  . aspirin EC  325 mg Oral Daily  . bisacodyl  10 mg Oral Daily   Or  . bisacodyl  10 mg Rectal Daily  . [COMPLETED] cefUROXime (ZINACEF)  IV  1.5 g Intravenous Q12H  .  cloNIDine  0.2 mg Transdermal Weekly  . docusate sodium  200 mg Oral Daily  . enoxaparin  30 mg Subcutaneous Q24H  . [COMPLETED] furosemide  40 mg Intravenous Once  . insulin aspart  0-24 Units Subcutaneous Q4H  . insulin glargine  20 Units Subcutaneous BID  . levothyroxine  125 mcg Oral Q breakfast  . metoprolol tartrate  25 mg Oral BID  . pantoprazole  40 mg Oral Daily  . sodium chloride  3 mL Intravenous Q12H    ROS: History obtained from the patient  General ROS: negative for - chills, fatigue, fever, night sweats, weight gain or weight loss Psychological ROS: negative for - behavioral disorder, hallucinations, memory difficulties, mood swings or suicidal ideation Ophthalmic ROS: negative for - blurry vision, double vision, eye pain or loss of vision ENT ROS: negative for - epistaxis, nasal discharge, oral lesions, sore throat, tinnitus or  vertigo Allergy and Immunology ROS: negative for - hives or itchy/watery eyes Hematological and Lymphatic ROS: negative for - bleeding problems, bruising or swollen lymph nodes Endocrine ROS: negative for - galactorrhea, hair pattern changes, polydipsia/polyuria or temperature intolerance Respiratory ROS: negative for - cough, hemoptysis, shortness of breath or wheezing Cardiovascular ROS: chest pressure Gastrointestinal ROS: negative for - abdominal pain, diarrhea, hematemesis, nausea/vomiting or stool incontinence Genito-Urinary ROS: negative for - dysuria, hematuria, incontinence or urinary frequency/urgency Musculoskeletal ROS: negative for - joint swelling or muscular weakness Neurological ROS: as noted in HPI Dermatological ROS: negative for rash and skin lesion changes  Physical Examination: Blood pressure 170/78, pulse 120, temperature 98.6 F (37 C), temperature source Oral, resp. rate 21, height 5\' 5"  (1.651 m), weight 102.3 kg (225 lb 8.5 oz), SpO2 92.00%.  Neurologic Examination: Mental Status: Alert, oriented, thought content appropriate.  Speech fluent without evidence of aphasia.  Able to follow 3 step commands without difficulty. Cranial Nerves: II: Discs flat bilaterally; Visual fields grossly normal, pupils equal, round, reactive to light and accommodation III,IV, VI: ptosis not present, extra-ocular motions intact bilaterally V,VII: right facial droop, facial light touch sensation normal bilaterally VIII: hearing normal bilaterally IX,X: gag reflex reduced XI: bilateral shoulder shrug XII: midline tongue extension Motor: Right : Upper extremity   5/5    Left:     Upper extremity   3/5  Lower extremity   5/5     Lower extremity   2-3/5 Tone and bulk:normal tone throughout; no atrophy noted Sensory: Pinprick and light touch intact throughout, bilaterally Deep Tendon Reflexes: 2+ and symmetric with absent AJ's bilaterally Plantars: Right: mute   Left:  mute Cerebellar: normal finger-to-nose and normal heel-to-shin testing on the left Gait: Unable to be tested CV: pulses palpable throughout although duskiness noted on the toes on the right   Laboratory Studies:  Basic Metabolic Panel:  Lab [id number] 09/06/12 1635 09/06/12 1634 09/06/12 0345 09/05/12 1935 09/05/12 1933 09/05/12 1343 09/03/12 1355  NA 131* -- 134* 133* -- 137 139 --  K 4.1 -- 4.5 4.4 -- 4.3 3.4* --  CL 96 -- 97 100 -- 104 -- 101  CO2 29 -- -- 24 -- -- -- 23  GLUCOSE 170* -- 170* 130* -- 151* 122* --  BUN 8 -- 7 7 -- 8 -- 11  CREATININE 0.62 0.66 0.80 0.60 0.66 -- -- --  CALCIUM 8.3* -- -- 8.3* -- -- -- 9.3  MG -- 1.8 -- 2.0 2.4 -- -- --  PHOS -- -- -- -- -- -- -- --    Liver Function  Tests:  Lab 09/03/12 1355  AST 17  ALT 11  ALKPHOS 126*  BILITOT 0.3  PROT 8.6*  ALBUMIN 3.4*   No results found for this basename: LIPASE:5,AMYLASE:5 in the last 168 hours No results found for this basename: AMMONIA:3 in the last 168 hours  CBC:  Lab 09/07/12 0422 09/06/12 1635 09/06/12 1634 09/06/12 0345 09/05/12 1935 09/05/12 1339  WBC 11.8* 14.3* -- 13.8* 14.9* 12.1*  NEUTROABS -- -- -- -- -- --  HGB 10.5* 11.0* 11.2* 9.9* 9.9* --  HCT 31.9* 32.8* 33.0* 29.6* 29.0* --  MCV 86.4 86.1 -- 85.5 85.0 85.5  PLT 193 204 -- 198 156 161    Cardiac Enzymes: No results found for this basename: CKTOTAL:5,CKMB:5,CKMBINDEX:5,TROPONINI:5 in the last 168 hours  BNP: No components found with this basename: POCBNP:5  CBG:  Lab 09/07/12 0731 09/07/12 0414 09/07/12 0014 09/06/12 1954 09/06/12 1619  GLUCAP 144* 167* 146* 144* 157*    Microbiology: Results for orders placed during the hospital encounter of 09/03/12  SURGICAL PCR SCREEN     Status: Normal   Collection Time   09/03/12  1:54 PM      Component Value Range Status Comment   MRSA, PCR NEGATIVE  NEGATIVE Final    Staphylococcus aureus NEGATIVE  NEGATIVE Final     Coagulation Studies:  Basename 09/05/12 1339   LABPROT 16.1*  INR 1.32    Urinalysis:  Lab 09/03/12 1355  COLORURINE YELLOW  LABSPEC 1.020  PHURINE 5.0  GLUCOSEU NEGATIVE  HGBUR NEGATIVE  BILIRUBINUR NEGATIVE  KETONESUR NEGATIVE  PROTEINUR NEGATIVE  UROBILINOGEN 0.2  NITRITE NEGATIVE  LEUKOCYTESUR SMALL*    Lipid Panel: No results found for this basename: chol, trig, hdl, cholhdl, vldl, ldlcalc    HgbA1C:  Lab Results  Component Value Date   HGBA1C 6.8* 09/03/2012    Urine Drug Screen:   No results found for this basename: labopia, cocainscrnur, labbenz, amphetmu, thcu, labbarb    Alcohol Level: No results found for this basename: ETH:2 in the last 168 hours  Imaging: Ct Head Wo Contrast  09/07/2012  *RADIOLOGY REPORT*  Clinical Data: Right side weakness.  CT HEAD WITHOUT CONTRAST  Technique:  Contiguous axial images were obtained from the base of the skull through the vertex without contrast.  Comparison: None.  Findings: Mild cortical atrophy is seen.  There is no evidence of acute intracranial abnormality including infarct, hemorrhage, mass lesion, mass effect, midline shift or abnormal extra-axial fluid collection.  No hydrocephalus or pneumocephalus.  Carotid atherosclerosis is noted.  IMPRESSION: Mild cortical atrophy.  No acute abnormality.   Original Report Authenticated By: Orlean Patten, M.D.    Dg Chest Portable 1 View In Am  09/07/2012  *RADIOLOGY REPORT*  Clinical Data: Postop cardiac surgery.  PORTABLE CHEST - 1 VIEW  Comparison: 09/06/2012  Findings: Swan-Ganz catheter has been removed.  Right jugular central line is still present.  Chest drains have been removed.  No evidence for a pneumothorax.  Patchy densities at the right lung base are suggestive for atelectasis.  Heart size is large.  Median sternotomy wires are present.  IMPRESSION: Removal of chest drains without a pneumothorax.  Right basilar atelectasis.   Original Report Authenticated By: Markus Daft, M.D.    Dg Chest Portable 1 View In  Am  09/06/2012  *RADIOLOGY REPORT*  Clinical Data: Status post CABG.  PORTABLE CHEST - 1 VIEW  Comparison: Chest 09/05/2012.  Findings: Endotracheal tube and NG tube remain in place.  Right IJ approach Swan-Ganz  catheter, mediastinal drain and left chest tube are unchanged.  Small bilateral pleural effusions and basilar atelectasis are noted.  No pneumothorax.  There is cardiomegaly.  IMPRESSION:  1.  Status post extubation and NG tube removal. 2.  No marked change in small bilateral effusions and basilar atelectasis, greater on the right.   Original Report Authenticated By: Orlean Patten, M.D.    Dg Chest Portable 1 View  09/05/2012  *RADIOLOGY REPORT*  Clinical Data: Postop CABG.  PORTABLE CHEST - 1 VIEW  Comparison: 09/03/2012  Findings: Interval median sternotomy and CABG.  Endotracheal tube is in place, 4.1 cm above carina.  Nasogastric tube is in place, tip beyond the film but beyond the gastroesophageal junction. There are mediastinal drains and a left-sided chest tube.  Right- sided Swan-Ganz catheter tip overlies the level of the pulmonary outflow tract.  Patient is rotated towards the right.  Heart is enlarged.  There are changes of mild interstitial edema.  There is no evidence for pneumothorax.  IMPRESSION:  1.  Postoperative changes following median sternotomy and CABG and 2.  Mild pulmonary edema. 3.  No evidence for pneumothorax.   Original Report Authenticated By: Nolon Nations, M.D.     Assessment: 78 y.o. female presenting with acute onset right sided weakness, two days post op CABG.  Acute left brain cerebral ischemia likely.  Ct of the brain ordered STAT and shows no acute changes.  From exam do not expect a large vessel event and sine patient allergic to dye and unable to have a MRI of the brain do not feel the patient would be an intervention candidate.  Not a tPA candidate due to recent surgery.  Patient on antiplatelet therapy.  TEE from 12/5 unremarkable.  Doppler from 12/3 shows  no significant stenosis.  A1c 6.8 on 12/3.    Stroke Risk Factors - diabetes mellitus, hyperlipidemia and hypertension  Plan: 1  Repeat head CT in 2-3 days 2. PT consult, OT consult, Speech consult 3. Prophylactic therapy-Antiplatelet med: Aspirin - dose continue ASA 4. Risk factor modification 5. Telemetry monitoring 6. Frequent neuro checks 7. Stroke swallow screen   Alexis Goodell, MD [facility] [phone] 09/07/2012, 11:38 AM

## 2012-09-07 NOTE — Progress Notes (Signed)
During neuro exam at 2345, patient noted to have marked increase in delay of response to questions. She is now unable to tell me the year. At times she says random, incoherent words. From prior exam, increased difficulty in performing left finger to tip of nose command. Had to ask her several times, and demonstrate what I needed her to do. After a second demonstration, she was finally able to touch her left index finger to her nose. Husband expresses concern that she is having increased difficulty communicating. Spoke with Dr. Tobias Alexander from neuro. Order for repeat CT head. Richarda Blade RN

## 2012-09-07 NOTE — Progress Notes (Signed)
Patient ID: [name], female   DOB: 1934-03-08, 78 y.o.   MRN: [id number]                   [address]            [place] [address]          [phone]     2 Days Post-Op Procedure(s) (LRB): CORONARY ARTERY BYPASS GRAFTING (CABG) (N/A) INTRAOPERATIVE TRANSESOPHAGEAL ECHOCARDIOGRAM (N/A)  Total Length of Stay:  LOS: 2 days  BP 110/57  Pulse 99  Temp 98.3 F (36.8 C) (Oral)  Resp 17  Ht 5\' 5"  (1.651 m)  Wt 225 lb 8.5 oz (102.3 kg)  BMI 37.53 kg/m2  SpO2 96%  .Intake/Output      12/06 0701 - 12/07 0700 12/07 0701 - 12/08 0700   P.O. 300 480   I.V. (mL/kg) 575.6 (5.6) 210 (2.1)   Blood     NG/GT     IV Piggyback 100 50   Total Intake(mL/kg) 975.6 (9.5) 740 (7.2)   Urine (mL/kg/hr) 1335 (0.5) 2670 (2.2)   Blood     Chest Tube 100    Total Output 1435 2670   Net -459.4 -1930             . sodium chloride 20 mL/hr at 09/05/12 1425  . sodium chloride 20 mL/hr at 09/06/12 2309  . sodium chloride    . nitroGLYCERIN 10 mcg/min (09/06/12 1300)     Lab Results  Component Value Date   WBC 11.8* 09/07/2012   HGB 10.5* 09/07/2012   HCT 31.9* 09/07/2012   PLT 193 09/07/2012   GLUCOSE 170* 09/07/2012   ALT 11 09/03/2012   AST 17 09/03/2012   NA 131* 09/07/2012   K 4.1 09/07/2012   CL 96 09/07/2012   CREATININE 0.62 09/07/2012   BUN 8 09/07/2012   CO2 29 09/07/2012   INR 1.32 09/05/2012   HGBA1C 6.8* 09/03/2012   Neuro deficit more dense this afternoon, some trouble with speech and increased weakness of rt arm and leg. CT of head done and has been  seen by neurology twice today Will leave foley in place   Grace Isaac MD  Beeper [phone] Office [phone] 09/07/2012 7:00 PM

## 2012-09-07 NOTE — Progress Notes (Signed)
Patient ID: [name], female   DOB: 05/02/1934, 78 y.o.   MRN: [id number] TCTS DAILY PROGRESS NOTE                   [address]            [place] [address]          [phone]      2 Days Post-Op Procedure(s) (LRB): CORONARY ARTERY BYPASS GRAFTING (CABG) (N/A) INTRAOPERATIVE TRANSESOPHAGEAL ECHOCARDIOGRAM (N/A)  Total Length of Stay:  LOS: 2 days   Subjective: Nausea much improved, walked in the hall this morning  Objective: Vital signs in last 24 hours: Temp:  [98 F (36.7 C)-99.3 F (37.4 C)] 98.3 F (36.8 C) (12/07 0733) Pulse Rate:  [86-113] 102  (12/07 0700) Cardiac Rhythm:  [-] Normal sinus rhythm (12/07 0700) Resp:  [12-24] 18  (12/07 0700) BP: (114-163)/(45-97) 127/60 mmHg (12/07 0700) SpO2:  [92 %-98 %] 93 % (12/07 0700) Arterial Line BP: (119-159)/(76-128) 159/128 mmHg (12/06 1000) Weight:  [225 lb 8.5 oz (102.3 kg)] 225 lb 8.5 oz (102.3 kg) (12/07 0500)  Filed Weights   09/05/12 1345 09/06/12 0600 09/07/12 0500  Weight: 216 lb 0.8 oz (98 kg) 228 lb 13.4 oz (103.8 kg) 225 lb 8.5 oz (102.3 kg)    Weight change: 7 lb 4.4 oz (3.3 kg)   Hemodynamic parameters for last 24 hours: PAP: (30-32)/(16-20) 32/16 mmHg CO:  [6.6 L/min] 6.6 L/min CI:  [3.2 L/min/m2] 3.2 L/min/m2  Intake/Output from previous day: 12/06 0701 - 12/07 0700 In: 975.6 [P.O.:300; I.V.:575.6; IV Piggyback:100] Out: [id number] [Urine:1305; Chest Tube:100]  Intake/Output this shift:    Current Meds: Scheduled Meds:   . acetaminophen  1,000 mg Oral Q6H  . aspirin EC  325 mg Oral Daily  . bisacodyl  10 mg Oral Daily   Or  . bisacodyl  10 mg Rectal Daily  . cefUROXime (ZINACEF)  IV  1.5 g Intravenous Q12H  . cloNIDine  0.2 mg Transdermal Weekly  . docusate sodium  200 mg Oral Daily  . [EXPIRED] famotidine (PEPCID) IV  20 mg Intravenous Q12H  . insulin aspart  0-24 Units Subcutaneous Q4H  . insulin glargine  20 Units Subcutaneous BID  . levothyroxine  125 mcg Oral  Q breakfast  . metoprolol tartrate  25 mg Oral BID  . [COMPLETED] midazolam  2 mg Intravenous Once  . pantoprazole  40 mg Oral Daily  . sodium chloride  3 mL Intravenous Q12H                                 Continuous Infusions:   . sodium chloride 20 mL/hr at 09/05/12 1425  . sodium chloride 20 mL/hr at 09/06/12 2309  . sodium chloride    . nitroGLYCERIN 10 mcg/min (09/06/12 1300)  . [DISCONTINUED] dexmedetomidine Stopped (09/05/12 1733)  . [DISCONTINUED] insulin (NOVOLIN-R) infusion 5.3 mL/hr at 09/06/12 0800  . [DISCONTINUED] lactated ringers 20 mL/hr at 09/05/12 1621  . [DISCONTINUED] phenylephrine (NEO-SYNEPHRINE) Adult infusion     PRN Meds:.[EXPIRED] albumin human, lip balm, metoprolol, midazolam, morphine injection, ondansetron (ZOFRAN) IV, oxyCODONE, promethazine, sodium chloride, [DISCONTINUED]  morphine injection  General appearance: alert, cooperative and no distress Neurologic: intact Heart: regular rate and rhythm, S1, S2 normal, no murmur, click, rub or gallop and normal apical impulse Lungs: clear to auscultation bilaterally and normal percussion bilaterally Abdomen: soft, non-tender; bowel sounds normal; no masses,  no organomegaly Extremities: extremities normal, atraumatic, no cyanosis or edema and Homans sign is negative, no sign of DVT Wound: Sternum stable  Lab Results: CBC: Basename 09/07/12 0422 09/06/12 1635  WBC 11.8* 14.3*  HGB 10.5* 11.0*  HCT 31.9* 32.8*  PLT 193 204   BMET:  Basename 09/07/12 0422 09/06/12 1635 09/06/12 1634 09/06/12 0345  NA 131* -- 134* --  K 4.1 -- 4.5 --  CL 96 -- 97 --  CO2 29 -- -- 24  GLUCOSE 170* -- 170* --  BUN 8 -- 7 --  CREATININE 0.62 0.66 -- --  CALCIUM 8.3* -- -- 8.3*    PT/INR:  Basename 09/05/12 1339  LABPROT 16.1*  INR 1.32   Radiology: Dg Chest Portable 1 View In Am  09/07/2012  *RADIOLOGY REPORT*  Clinical Data: Postop cardiac surgery.  PORTABLE CHEST - 1 VIEW  Comparison: 09/06/2012  Findings:  Swan-Ganz catheter has been removed.  Right jugular central line is still present.  Chest drains have been removed.  No evidence for a pneumothorax.  Patchy densities at the right lung base are suggestive for atelectasis.  Heart size is large.  Median sternotomy wires are present.  IMPRESSION: Removal of chest drains without a pneumothorax.  Right basilar atelectasis.   Original Report Authenticated By: Markus Daft, M.D.    Dg Chest Portable 1 View In Am  09/06/2012  *RADIOLOGY REPORT*  Clinical Data: Status post CABG.  PORTABLE CHEST - 1 VIEW  Comparison: Chest 09/05/2012.  Findings: Endotracheal tube and NG tube remain in place.  Right IJ approach Swan-Ganz catheter, mediastinal drain and left chest tube are unchanged.  Small bilateral pleural effusions and basilar atelectasis are noted.  No pneumothorax.  There is cardiomegaly.  IMPRESSION:  1.  Status post extubation and NG tube removal. 2.  No marked change in small bilateral effusions and basilar atelectasis, greater on the right.   Original Report Authenticated By: Orlean Patten, M.D.    Dg Chest Portable 1 View  09/05/2012  *RADIOLOGY REPORT*  Clinical Data: Postop CABG.  PORTABLE CHEST - 1 VIEW  Comparison: 09/03/2012  Findings: Interval median sternotomy and CABG.  Endotracheal tube is in place, 4.1 cm above carina.  Nasogastric tube is in place, tip beyond the film but beyond the gastroesophageal junction. There are mediastinal drains and a left-sided chest tube.  Right- sided Swan-Ganz catheter tip overlies the level of the pulmonary outflow tract.  Patient is rotated towards the right.  Heart is enlarged.  There are changes of mild interstitial edema.  There is no evidence for pneumothorax.  IMPRESSION:  1.  Postoperative changes following median sternotomy and CABG and 2.  Mild pulmonary edema. 3.  No evidence for pneumothorax.   Original Report Authenticated By: Nolon Nations, M.D.      Assessment/Plan: S/P Procedure(s) (LRB): CORONARY  ARTERY BYPASS GRAFTING (CABG) (N/A) INTRAOPERATIVE TRANSESOPHAGEAL ECHOCARDIOGRAM (N/A) Mobilize Diuresis Diabetes control Nausea improved Weight still up 10 pounds DC Foley  Chuong Casebeer B 09/07/2012 7:59 AM

## 2012-09-07 NOTE — Progress Notes (Signed)
Patient ID: [name], female   DOB: 1934-09-28, 78 y.o.   MRN: [id number] After seen this morning patient was sitting in chair started leaning toward the right When previously she walked around the unit and now to 2 nurses to help her stand She has minor weakness in the right arm with some drift Speech is unaffected It is unclear if this is medication effect or TIA We'll ask neurology to evaluate   Grace Isaac MD  Beeper [phone] Office [phone] 09/07/2012 8:16 AM

## 2012-09-07 NOTE — Progress Notes (Signed)
Patient displayed R sided weakness about 0815 this morning.  30 minutes prior patient was able to stand with minimal assist and had no right sided weakness.  Patient had walked around the unit as a one person assist prior to the start of my shift.  Dr. Servando Snare was notified and Neurology was consulted.  A head CT and stroke swallow were ordered.  Patient passed bedside stroke swallow screen that I performed.  Will continue to monitor and assess.   Doran Clay, RN

## 2012-09-08 ENCOUNTER — Inpatient Hospital Stay (HOSPITAL_COMMUNITY): Payer: Medicare Other

## 2012-09-08 DIAGNOSIS — I2510 Atherosclerotic heart disease of native coronary artery without angina pectoris: Principal | ICD-10-CM

## 2012-09-08 LAB — BASIC METABOLIC PANEL
BUN: 11 mg/dL (ref 6–23)
CO2: 31 mEq/L (ref 19–32)
Calcium: 8.4 mg/dL (ref 8.4–10.5)
Chloride: 95 mEq/L — ABNORMAL LOW (ref 96–112)
Creatinine, Ser: 0.59 mg/dL (ref 0.50–1.10)
GFR calc Af Amer: >90 mL/min (ref >90)
GFR calc non Af Amer: 87 mL/min — ABNORMAL LOW (ref >90)
Glucose, Bld: 143 mg/dL — ABNORMAL HIGH (ref 70–99)
Potassium: 3.5 mEq/L (ref 3.5–5.1)
Sodium: 132 mEq/L — ABNORMAL LOW (ref 135–145)

## 2012-09-08 LAB — CBC
HCT: 32.4 % — ABNORMAL LOW (ref 36.0–46.0)
Hemoglobin: 10.9 g/dL — ABNORMAL LOW (ref 12.0–15.0)
MCH: 29.1 pg (ref 26.0–34.0)
MCHC: 33.6 g/dL (ref 30.0–36.0)
MCV: 86.6 fL (ref 78.0–100.0)
Platelets: 191 10*3/uL (ref 150–400)
RBC: 3.74 MIL/uL — ABNORMAL LOW (ref 3.87–5.11)
RDW: 14.3 % (ref 11.5–15.5)
WBC: 9.9 10*3/uL (ref 4.0–10.5)

## 2012-09-08 LAB — GLUCOSE, CAPILLARY
Glucose-Capillary: 113 mg/dL — ABNORMAL HIGH (ref 70–99)
Glucose-Capillary: 119 mg/dL — ABNORMAL HIGH (ref 70–99)
Glucose-Capillary: 124 mg/dL — ABNORMAL HIGH (ref 70–99)
Glucose-Capillary: 145 mg/dL — ABNORMAL HIGH (ref 70–99)
Glucose-Capillary: 171 mg/dL — ABNORMAL HIGH (ref 70–99)
Glucose-Capillary: 282 mg/dL — ABNORMAL HIGH (ref 70–99)

## 2012-09-08 MED ORDER — INSULIN GLARGINE 100 UNIT/ML ~~LOC~~ SOLN
24.0000 [IU] | Freq: Two times a day (BID) | SUBCUTANEOUS | Status: DC
  Administered 2012-09-08 – 2012-09-10 (×4): 24 [IU] via SUBCUTANEOUS

## 2012-09-08 MED ORDER — POTASSIUM CHLORIDE 10 MEQ/50ML IV SOLN
INTRAVENOUS | Status: AC
  Administered 2012-09-08: 10 meq via INTRAVENOUS
  Filled 2012-09-08: qty 150

## 2012-09-08 MED ORDER — POTASSIUM CHLORIDE 10 MEQ/50ML IV SOLN
10.0000 meq | INTRAVENOUS | Status: AC | PRN
  Administered 2012-09-08 (×3): 10 meq via INTRAVENOUS

## 2012-09-08 MED ORDER — INSULIN ASPART 100 UNIT/ML ~~LOC~~ SOLN: 0.0000 [IU] | SUBCUTANEOUS | Status: DC

## 2012-09-08 NOTE — Progress Notes (Signed)
Stroke swallow screen repeated on patient this AM.  Patient passed and continue ordered regular diet.  R sided weakness improved from yesterday.  Doran Clay, RN

## 2012-09-08 NOTE — Progress Notes (Signed)
Patient ID: [name], female   DOB: 08/29/1934, 78 y.o.   MRN: [id number]                       [address]            [place] [address]          [phone]     3 Days Post-Op Procedure(s) (LRB): CORONARY ARTERY BYPASS GRAFTING (CABG) (N/A) INTRAOPERATIVE TRANSESOPHAGEAL ECHOCARDIOGRAM (N/A)  Total Length of Stay:  LOS: 3 days  BP 126/60  Pulse 105  Temp 97.8 F (36.6 C) (Oral)  Resp 15  Ht 5\' 5"  (1.651 m)  Wt 225 lb 8.5 oz (102.3 kg)  BMI 37.53 kg/m2  SpO2 98%  .Intake/Output      12/07 0701 - 12/08 0700 12/08 0701 - 12/09 0700   P.O. 480 480   I.V. (mL/kg) 410 (4) 200 (2)   IV Piggyback 200    Total Intake(mL/kg) 1090 (10.7) 680 (6.6)   Urine (mL/kg/hr) 3640 (1.5) 785 (0.7)   Chest Tube     Total Output 3640 785   Net -2550 -105             . sodium chloride 20 mL/hr at 09/06/12 2309  . sodium chloride    . nitroGLYCERIN 10 mcg/min (09/06/12 1300)  . [DISCONTINUED] sodium chloride 20 mL/hr at 09/05/12 1425     Lab Results  Component Value Date   WBC 9.9 09/08/2012   HGB 10.9* 09/08/2012   HCT 32.4* 09/08/2012   PLT 191 09/08/2012   GLUCOSE 143* 09/08/2012   ALT 11 09/03/2012   AST 17 09/03/2012   NA 132* 09/08/2012   K 3.5 09/08/2012   CL 95* 09/08/2012   CREATININE 0.59 09/08/2012   BUN 11 09/08/2012   CO2 31 09/08/2012   INR 1.32 09/05/2012   HGBA1C 6.8* 09/03/2012   Still with neuro deficit but improved even from this am   Grace Isaac MD  Beeper [phone] Office [phone] 09/08/2012 6:17 PM

## 2012-09-08 NOTE — Progress Notes (Signed)
Patient ID: [name], female   DOB: [birth year], 78 y.o.   MRN: [id number] TCTS DAILY PROGRESS NOTE                   [address]            [place] [address]          [phone]      3 Days Post-Op Procedure(s) (LRB): CORONARY ARTERY BYPASS GRAFTING (CABG) (N/A) INTRAOPERATIVE TRANSESOPHAGEAL ECHOCARDIOGRAM (N/A)  Total Length of Stay:  LOS: 3 days   Subjective: This am has improved rt side function, speech is also better then last night  Objective: Vital signs in last 24 hours: Temp:  [98.1 F (36.7 C)-99 F (37.2 C)] 98.9 F (37.2 C) (12/08 0000) Pulse Rate:  [87-120] 105  (12/08 0700) Cardiac Rhythm:  [-] Sinus tachycardia (12/08 0700) Resp:  [14-22] 20  (12/08 0700) BP: (109-172)/(54-81) 126/74 mmHg (12/08 0700) SpO2:  [91 %-98 %] 95 % (12/08 0700)  Filed Weights   09/05/12 1345 09/06/12 0600 09/07/12 0500  Weight: 216 lb 0.8 oz (98 kg) 228 lb 13.4 oz (103.8 kg) 225 lb 8.5 oz (102.3 kg)    Weight change:    Hemodynamic parameters for last 24 hours:    Intake/Output from previous day: 12/07 0701 - 12/08 0700 In: 1070 [P.O.:480; I.V.:390; IV Piggyback:200] Out: 3640 [Urine:3640]  Intake/Output this shift:    Current Meds: Scheduled Meds:   . acetaminophen  1,000 mg Oral Q6H  . aspirin EC  325 mg Oral Daily  . bisacodyl  10 mg Oral Daily   Or  . bisacodyl  10 mg Rectal Daily  . [COMPLETED] cefUROXime (ZINACEF)  IV  1.5 g Intravenous Q12H  . cloNIDine  0.2 mg Transdermal Weekly  . docusate sodium  200 mg Oral Daily  . enoxaparin  30 mg Subcutaneous Q24H  . [COMPLETED] furosemide  40 mg Intravenous Once  . insulin aspart  0-24 Units Subcutaneous Q4H  . insulin glargine  20 Units Subcutaneous BID  . levothyroxine  125 mcg Oral Q breakfast  . metoprolol tartrate  25 mg Oral BID  . pantoprazole  40 mg Oral Daily  . sodium chloride  3 mL Intravenous Q12H   Continuous Infusions:   . sodium chloride 20 mL/hr at 09/06/12 2309  .  sodium chloride    . nitroGLYCERIN 10 mcg/min (09/06/12 1300)  . [DISCONTINUED] sodium chloride 20 mL/hr at 09/05/12 1425   PRN Meds:.lip balm, metoprolol, midazolam, morphine injection, ondansetron (ZOFRAN) IV, oxyCODONE, [COMPLETED] potassium chloride, promethazine, sodium chloride  General appearance: alert, cooperative, no distress and speech better Neurologic: right sided weakness,  Improved strength both rt arm and leg compared to last pm Heart: regular rate and rhythm, S1, S2 normal, no murmur, click, rub or gallop and normal apical impulse Lungs: diminished breath sounds bibasilar Abdomen: soft, non-tender; bowel sounds normal; no masses,  no organomegaly Extremities: extremities normal, atraumatic, no cyanosis or edema and Homans sign is negative, no sign of DVT Sternum stable  Lab Results: CBC: Basename 09/08/12 0325 09/07/12 0422  WBC 9.9 11.8*  HGB 10.9* 10.5*  HCT 32.4* 31.9*  PLT 191 193   BMET:  Basename 09/08/12 0325 09/07/12 0422  NA 132* 131*  K 3.5 4.1  CL 95* 96  CO2 31 29  GLUCOSE 143* 170*  BUN 11 8  CREATININE 0.59 0.62  CALCIUM 8.4 8.3*    PT/INR:  Basename 09/05/12 1339  LABPROT 16.1*  INR  1.32   Radiology: Ct Head Wo Contrast  09/08/2012  *RADIOLOGY REPORT*  Clinical Data: Progressive right-sided weakness and dysarthria.  CT HEAD WITHOUT CONTRAST  Technique:  Contiguous axial images were obtained from the base of the skull through the vertex without contrast.  Comparison: Earlier today.  Findings: Interval ill-defined low density in the left frontoparietal region, medially.  No intracranial hemorrhage or mass effect.  Stable mildly enlarged ventricles and subarachnoid spaces.  Stable old right frontal lobe white matter infarct and mild patchy white matter low density in both cerebral hemispheres. Unremarkable bones and included paranasal sinuses.  IMPRESSION:  1.  Subacute left frontoparietal infarct, medially. 2.  Stable atrophy, mild chronic small  vessel white matter ischemic changes and old right frontal lobe white matter lacunar infarct.  These results were called by telephone on 09/08/2012 at 0100 hours to [facility], the patient's nurse, who verbally acknowledged these results. She stated that she would inform Dr. Leonel Ramsay.   Original Report Authenticated By: Claudie Revering, M.D.    Ct Head Wo Contrast  09/07/2012  *RADIOLOGY REPORT*  Clinical Data: Right side weakness.  CT HEAD WITHOUT CONTRAST  Technique:  Contiguous axial images were obtained from the base of the skull through the vertex without contrast.  Comparison: None.  Findings: Mild cortical atrophy is seen.  There is no evidence of acute intracranial abnormality including infarct, hemorrhage, mass lesion, mass effect, midline shift or abnormal extra-axial fluid collection.  No hydrocephalus or pneumocephalus.  Carotid atherosclerosis is noted.  IMPRESSION: Mild cortical atrophy.  No acute abnormality.   Original Report Authenticated By: Orlean Patten, M.D.    Dg Chest Portable 1 View In Am  09/07/2012  *RADIOLOGY REPORT*  Clinical Data: Postop cardiac surgery.  PORTABLE CHEST - 1 VIEW  Comparison: 09/06/2012  Findings: Swan-Ganz catheter has been removed.  Right jugular central line is still present.  Chest drains have been removed.  No evidence for a pneumothorax.  Patchy densities at the right lung base are suggestive for atelectasis.  Heart size is large.  Median sternotomy wires are present.  IMPRESSION: Removal of chest drains without a pneumothorax.  Right basilar atelectasis.   Original Report Authenticated By: Markus Daft, M.D.      Assessment/Plan: S/P Procedure(s) (LRB): CORONARY ARTERY BYPASS GRAFTING (CABG) (N/A) INTRAOPERATIVE TRANSESOPHAGEAL ECHOCARDIOGRAM (N/A) Mobilize Diuresis Diabetes control Continue foley due to stroke On Lovenox for dvt prophylaxis and asa, can add plavix if neuro thinks will be help full May need pic line tomorrow  Pt,ot consults  ordered  [name] 09/08/2012 7:55 AM

## 2012-09-08 NOTE — Progress Notes (Signed)
Stroke Team Progress Note  HISTORY [name] is an 78 y.o. female who underwent CABG on 09/05/12. On today was able to ambulate with assistance this morning. Was checked by nursing at 0815 and was noted at that time to be leaning to the right and have right sided weakness. Consult was called at that time. Patient has pacing wires in place and will not bea ble to have a MRI. Is allergic to contrast dye.  LSN: [id number]  tPA Given: No: Recent CABG      SUBJECTIVE Bo family is at the bedside.  Overall she feels her condition is fluctuating. Dr Servando Snare feels rt sided weakness is improving. She denies prior h/o stroke but CT head shows old rt frontal infarct and new lt medial frontopareital infarct  OBJECTIVE Most recent Vital Signs: Filed Vitals:   09/08/12 0700 09/08/12 0800 09/08/12 0825 09/08/12 0900  BP: 126/74 135/75  105/76  Pulse: 105 98  114  Temp:  97.8 F (36.6 C) 97.8 F (36.6 C)   TempSrc:   Oral   Resp: 20 18  21   Height:      Weight:      SpO2: 95% 96%  97%   CBG (last 3)   Basename 09/08/12 0821 09/08/12 0312 09/07/12 2348  GLUCAP 113* 145* 119*    IV Fluid Intake:     . sodium chloride 20 mL/hr at 09/06/12 2309  . sodium chloride    . nitroGLYCERIN 10 mcg/min (09/06/12 1300)  . [DISCONTINUED] sodium chloride 20 mL/hr at 09/05/12 1425    MEDICATIONS    . acetaminophen  1,000 mg Oral Q6H  . aspirin EC  325 mg Oral Daily  . bisacodyl  10 mg Oral Daily   Or  . bisacodyl  10 mg Rectal Daily  . [COMPLETED] cefUROXime (ZINACEF)  IV  1.5 g Intravenous Q12H  . cloNIDine  0.2 mg Transdermal Weekly  . docusate sodium  200 mg Oral Daily  . enoxaparin  30 mg Subcutaneous Q24H  . [COMPLETED] furosemide  40 mg Intravenous Once  . insulin aspart  0-24 Units Subcutaneous Q4H  . insulin glargine  20 Units Subcutaneous BID  . levothyroxine  125 mcg Oral Q breakfast  . metoprolol tartrate  25 mg Oral BID  . pantoprazole  40 mg Oral Daily  . sodium chloride  3 mL  Intravenous Q12H   PRN:  lip balm, metoprolol, midazolam, morphine injection, ondansetron (ZOFRAN) IV, oxyCODONE, [COMPLETED] potassium chloride, promethazine, sodium chloride  Diet:  Cardiac   Activity:  Bedrest  DVT Prophylaxis:  lovenox  CLINICALLY SIGNIFICANT STUDIES Basic Metabolic Panel:  Lab [id number] 09/07/12 0422 09/06/12 1635 09/06/12 0345  NA 132* 131* -- --  K 3.5 4.1 -- --  CL 95* 96 -- --  CO2 31 29 -- --  GLUCOSE 143* 170* -- --  BUN 11 8 -- --  CREATININE 0.59 0.62 -- --  CALCIUM 8.4 8.3* -- --  MG -- -- 1.8 2.0  PHOS -- -- -- --   Liver Function Tests:  Lab 09/03/12 1355  AST 17  ALT 11  ALKPHOS 126*  BILITOT 0.3  PROT 8.6*  ALBUMIN 3.4*   CBC:  Lab 09/08/12 0325 09/07/12 0422  WBC 9.9 11.8*  NEUTROABS -- --  HGB 10.9* 10.5*  HCT 32.4* 31.9*  MCV 86.6 86.4  PLT 191 193   Coagulation:  Lab 09/05/12 1339 09/03/12 1355  LABPROT 16.1* 12.8  INR 1.32 0.97   Cardiac Enzymes: No  results found for this basename: CKTOTAL:3,CKMB:3,CKMBINDEX:3,TROPONINI:3 in the last 168 hours Urinalysis:  Lab 09/03/12 1355  COLORURINE YELLOW  LABSPEC 1.020  PHURINE 5.0  GLUCOSEU NEGATIVE  HGBUR NEGATIVE  BILIRUBINUR NEGATIVE  KETONESUR NEGATIVE  PROTEINUR NEGATIVE  UROBILINOGEN 0.2  NITRITE NEGATIVE  LEUKOCYTESUR SMALL*   Lipid Panel No results found for this basename: chol, trig, hdl, cholhdl, vldl, ldlcalc   HgbA1C  Lab Results  Component Value Date   HGBA1C 6.8* 09/03/2012    Urine Drug Screen:   No results found for this basename: labopia, cocainscrnur, labbenz, amphetmu, thcu, labbarb    Alcohol Level: No results found for this basename: ETH:2 in the last 168 hours  Ct Head Wo Contrast  09/08/2012  *RADIOLOGY REPORT*  Clinical Data: Progressive right-sided weakness and dysarthria.  CT HEAD WITHOUT CONTRAST  Technique:  Contiguous axial images were obtained from the base of the skull through the vertex without contrast.  Comparison: Earlier  today.  Findings: Interval ill-defined low density in the left frontoparietal region, medially.  No intracranial hemorrhage or mass effect.  Stable mildly enlarged ventricles and subarachnoid spaces.  Stable old right frontal lobe white matter infarct and mild patchy white matter low density in both cerebral hemispheres. Unremarkable bones and included paranasal sinuses.  IMPRESSION:  1.  Subacute left frontoparietal infarct, medially. 2.  Stable atrophy, mild chronic small vessel white matter ischemic changes and old right frontal lobe white matter lacunar infarct.  These results were called by telephone on 09/08/2012 at 0100 hours to [facility], the patient's nurse, who verbally acknowledged these results. She stated that she would inform Dr. Leonel Ramsay.   Original Report Authenticated By: Claudie Revering, M.D.    Ct Head Wo Contrast  09/07/2012  *RADIOLOGY REPORT*  Clinical Data: Right side weakness.  CT HEAD WITHOUT CONTRAST  Technique:  Contiguous axial images were obtained from the base of the skull through the vertex without contrast.  Comparison: None.  Findings: Mild cortical atrophy is seen.  There is no evidence of acute intracranial abnormality including infarct, hemorrhage, mass lesion, mass effect, midline shift or abnormal extra-axial fluid collection.  No hydrocephalus or pneumocephalus.  Carotid atherosclerosis is noted.  IMPRESSION: Mild cortical atrophy.  No acute abnormality.   Original Report Authenticated By: Orlean Patten, M.D.    Dg Chest Port 1 View  09/08/2012  *RADIOLOGY REPORT*  Clinical Data: 78 year old female - postop day #3 CABG.  PORTABLE CHEST - 1 VIEW  Comparison: 09/07/2012 and prior chest radiographs dating back to 09/03/2012  Findings: Cardiomegaly and CABG changes again noted.  Right basilar atelectasis and small right pleural effusion are again noted. There is no evidence of pneumothorax or pulmonary edema. A right IJ central venous catheter is again noted with tip  overlying the IJ - brachiocephalic junction. No significant changes are identified.  IMPRESSION: Stable chest radiograph with mild right basilar atelectasis and small right pleural effusion.  No evidence of pneumothorax.   Original Report Authenticated By: Margarette Canada, M.D.    Dg Chest Portable 1 View In Am  09/07/2012  *RADIOLOGY REPORT*  Clinical Data: Postop cardiac surgery.  PORTABLE CHEST - 1 VIEW  Comparison: 09/06/2012  Findings: Swan-Ganz catheter has been removed.  Right jugular central line is still present.  Chest drains have been removed.  No evidence for a pneumothorax.  Patchy densities at the right lung base are suggestive for atelectasis.  Heart size is large.  Median sternotomy wires are present.  IMPRESSION: Removal of chest drains without a pneumothorax.  Right basilar atelectasis.   Original Report Authenticated By: Markus Daft, M.D.       MRI of the brain  Unable due to pacer wire  MRA of the brain  Unable due to pacer wire  Transesophageal Echocardiogram  intraop 09/05/12 EF 50-55% . No clot  Carotid Doppler  09/03/12 no stenosis  CXR    EKG  Normal sinus rhythm Nonspecific T wave abnormality  Therapy Recommendations  pending Physical Exam  Pleasant elderly caucasian lady not in distress.Rt IJ central line and midline postCABG dressing.Awake alert. Afebrile. Head is nontraumatic. Neck is supple without bruit. Hearing is normal. Cardiac exam no murmur or gallop. Lungs are clear to auscultation. Distal pulses are well felt.  Neurological Exam ; Awake alert oriented x 3 normal speech and language. Mild right lower face asymmetry. Tongue midline. Rt UE and LE drift.RUE 3/5 and RLE 2-3/5 strength. LUE normal and LLE 4/5 strength.  Normal sensation . Normal coordination. Plantars upgoing on rt and downgoing on left. ASSESSMENT [name] is a 78 y.o. female presenting with Rt hemiparesis 2 days post CABG Imaging confirms a Left medial frontopareital infarct. Infarct felt  to be  embolic secondary to CABG.    On aspirin 325 mg orally every day prior to admission. Now on aspirin 325 mg orally every day for secondary stroke prevention. Patient with resultant  Rt hemiplegia.      Hospital day # 3  TREATMENT/PLAN  Continue aspirin 325 mg orally every day for secondary stroke prevention.  Mobilize out of bed, PT,OT,St and rehab consults  Check fasting lipids and TCD  D/W Dr Servando Snare and patient`s husband      Antony Contras, [place] Pager: [phone] 09/08/2012 10:03 AM

## 2012-09-08 NOTE — Progress Notes (Signed)
Dr. Tobias Alexander aware of CT head results. No further orders at this time. Richarda Blade RN

## 2012-09-09 DIAGNOSIS — I6340 Cerebral infarction due to embolism of unspecified cerebral artery: Secondary | ICD-10-CM

## 2012-09-09 DIAGNOSIS — I2510 Atherosclerotic heart disease of native coronary artery without angina pectoris: Secondary | ICD-10-CM

## 2012-09-09 LAB — CBC
HCT: 31.7 % — ABNORMAL LOW (ref 36.0–46.0)
Hemoglobin: 10.7 g/dL — ABNORMAL LOW (ref 12.0–15.0)
MCH: 29.2 pg (ref 26.0–34.0)
MCHC: 33.8 g/dL (ref 30.0–36.0)
MCV: 86.6 fL (ref 78.0–100.0)
Platelets: 221 10*3/uL (ref 150–400)
RBC: 3.66 MIL/uL — ABNORMAL LOW (ref 3.87–5.11)
RDW: 14.3 % (ref 11.5–15.5)
WBC: 8.9 10*3/uL (ref 4.0–10.5)

## 2012-09-09 LAB — GLUCOSE, CAPILLARY
Glucose-Capillary: 103 mg/dL — ABNORMAL HIGH (ref 70–99)
Glucose-Capillary: 112 mg/dL — ABNORMAL HIGH (ref 70–99)
Glucose-Capillary: 115 mg/dL — ABNORMAL HIGH (ref 70–99)
Glucose-Capillary: 151 mg/dL — ABNORMAL HIGH (ref 70–99)
Glucose-Capillary: 84 mg/dL (ref 70–99)
Glucose-Capillary: 95 mg/dL (ref 70–99)

## 2012-09-09 LAB — LIPID PANEL
Cholesterol: 154 mg/dL (ref 0–200)
HDL: 31 mg/dL — ABNORMAL LOW (ref >39)
LDL Cholesterol: 92 mg/dL (ref 0–99)
Total CHOL/HDL Ratio: 5 RATIO
Triglycerides: 157 mg/dL — ABNORMAL HIGH (ref <150)
VLDL: 31 mg/dL (ref 0–40)

## 2012-09-09 LAB — BASIC METABOLIC PANEL
BUN: 13 mg/dL (ref 6–23)
CO2: 32 mEq/L (ref 19–32)
Calcium: 8.3 mg/dL — ABNORMAL LOW (ref 8.4–10.5)
Chloride: 97 mEq/L (ref 96–112)
Creatinine, Ser: 0.61 mg/dL (ref 0.50–1.10)
GFR calc Af Amer: >90 mL/min (ref >90)
GFR calc non Af Amer: 86 mL/min — ABNORMAL LOW (ref >90)
Glucose, Bld: 103 mg/dL — ABNORMAL HIGH (ref 70–99)
Potassium: 3.5 mEq/L (ref 3.5–5.1)
Sodium: 134 mEq/L — ABNORMAL LOW (ref 135–145)

## 2012-09-09 MED ORDER — MOVING RIGHT ALONG BOOK
Freq: Once | Status: AC
  Administered 2012-09-09: 20:00:00
  Filled 2012-09-09: qty 1

## 2012-09-09 MED ORDER — SODIUM CHLORIDE 0.9 % IJ SOLN: 3.0000 mL | INTRAMUSCULAR | Status: DC | PRN

## 2012-09-09 MED ORDER — TRAMADOL HCL 50 MG PO TABS: 50.0000 mg | ORAL_TABLET | ORAL | Status: DC | PRN

## 2012-09-09 MED ORDER — POTASSIUM CHLORIDE 10 MEQ/50ML IV SOLN
INTRAVENOUS | Status: AC
  Administered 2012-09-09: 10 meq via INTRAVENOUS
  Filled 2012-09-09: qty 50

## 2012-09-09 MED ORDER — MOVING RIGHT ALONG BOOK
Freq: Once | Status: AC
  Administered 2012-09-09: 09:00:00
  Filled 2012-09-09: qty 1

## 2012-09-09 MED ORDER — POTASSIUM CHLORIDE CRYS ER 20 MEQ PO TBCR: 20.0000 meq | EXTENDED_RELEASE_TABLET | Freq: Every day | ORAL | Status: DC

## 2012-09-09 MED ORDER — SODIUM CHLORIDE 0.9 % IJ SOLN: 3.0000 mL | Freq: Two times a day (BID) | INTRAMUSCULAR | Status: DC

## 2012-09-09 MED ORDER — SODIUM CHLORIDE 0.9 % IV SOLN: 250.0000 mL | INTRAVENOUS | Status: DC | PRN

## 2012-09-09 MED ORDER — INSULIN ASPART 100 UNIT/ML ~~LOC~~ SOLN
0.0000 [IU] | Freq: Three times a day (TID) | SUBCUTANEOUS | Status: DC
  Administered 2012-09-09: 2 [IU] via SUBCUTANEOUS
  Administered 2012-09-10: 18:00:00 via SUBCUTANEOUS
  Administered 2012-09-10: 2 [IU] via SUBCUTANEOUS

## 2012-09-09 MED ORDER — POTASSIUM CHLORIDE 10 MEQ/50ML IV SOLN
10.0000 meq | INTRAVENOUS | Status: AC
  Administered 2012-09-09 (×3): 10 meq via INTRAVENOUS
  Filled 2012-09-09: qty 150

## 2012-09-09 MED ORDER — POTASSIUM CHLORIDE 10 MEQ/50ML IV SOLN
10.0000 meq | INTRAVENOUS | Status: AC | PRN
  Administered 2012-09-09 (×3): 10 meq via INTRAVENOUS
  Filled 2012-09-09 (×2): qty 50

## 2012-09-09 MED ORDER — METOPROLOL TARTRATE 25 MG PO TABS
25.0000 mg | ORAL_TABLET | Freq: Two times a day (BID) | ORAL | Status: DC
  Administered 2012-09-09 (×2): 25 mg via ORAL
  Filled 2012-09-09 (×4): qty 1

## 2012-09-09 MED ORDER — FUROSEMIDE 40 MG PO TABS
40.0000 mg | ORAL_TABLET | Freq: Two times a day (BID) | ORAL | Status: DC
  Administered 2012-09-09 – 2012-09-10 (×4): 40 mg via ORAL
  Filled 2012-09-09 (×5): qty 1

## 2012-09-09 MED ORDER — POTASSIUM CHLORIDE CRYS ER 20 MEQ PO TBCR
20.0000 meq | EXTENDED_RELEASE_TABLET | Freq: Two times a day (BID) | ORAL | Status: DC
  Administered 2012-09-10: 20 meq via ORAL
  Filled 2012-09-09 (×2): qty 1

## 2012-09-09 MED ORDER — SODIUM CHLORIDE 0.9 % IJ SOLN
3.0000 mL | Freq: Two times a day (BID) | INTRAMUSCULAR | Status: DC
  Administered 2012-09-09 – 2012-09-10 (×3): 3 mL via INTRAVENOUS

## 2012-09-09 MED FILL — Sodium Bicarbonate IV Soln 8.4%: INTRAVENOUS | Qty: 50 | Status: AC

## 2012-09-09 MED FILL — Albumin, Human Inj 5%: INTRAVENOUS | Qty: 250 | Status: AC

## 2012-09-09 MED FILL — Electrolyte-R (PH 7.4) Solution: INTRAVENOUS | Qty: 4000 | Status: AC

## 2012-09-09 MED FILL — Mannitol IV Soln 20%: INTRAVENOUS | Qty: 500 | Status: AC

## 2012-09-09 MED FILL — Heparin Sodium (Porcine) Inj 1000 Unit/ML: INTRAMUSCULAR | Qty: 30 | Status: AC

## 2012-09-09 MED FILL — Sodium Chloride Irrigation Soln 0.9%: Qty: 3000 | Status: AC

## 2012-09-09 MED FILL — Lidocaine HCl IV Inj 20 MG/ML: INTRAVENOUS | Qty: 5 | Status: AC

## 2012-09-09 MED FILL — Sodium Chloride IV Soln 0.9%: INTRAVENOUS | Qty: 1000 | Status: AC

## 2012-09-09 MED FILL — Heparin Sodium (Porcine) Inj 1000 Unit/ML: INTRAMUSCULAR | Qty: 10 | Status: AC

## 2012-09-09 NOTE — Progress Notes (Signed)
   CARDIOTHORACIC SURGERY PROGRESS NOTE   R4 Days Post-Op Procedure(s) (LRB): CORONARY ARTERY BYPASS GRAFTING (CABG) (N/A) INTRAOPERATIVE TRANSESOPHAGEAL ECHOCARDIOGRAM (N/A)  Subjective: Feels well this morning.  Able to lift right arm and leg against gravity, moving quite well.  Denies pain, SOB  Objective: Vital signs: BP Readings from Last 1 Encounters:  09/09/12 120/56   Pulse Readings from Last 1 Encounters:  09/09/12 103   Resp Readings from Last 1 Encounters:  09/09/12 21   Temp Readings from Last 1 Encounters:  09/09/12 97.6 F (36.4 C) Oral    Hemodynamics:    Physical Exam:  Rhythm:   sinus  Breath sounds: Diminished at bases  Heart sounds:  RRR  Incisions:  Clean and dry  Abdomen:  soft  Extremities:  Warm, well perfused   Intake/Output from previous day: 12/08 0701 - 12/09 0700 In: [id number] [P.O.:1020; I.V.:420; IV Piggyback:150] Out: 1970 [id number] Intake/Output this shift: Total I/O In: 80 [P.O.:60; I.V.:20] Out: 300 [Urine:300]  Lab Results:  [facility] 09/09/12 0344 09/08/12 0325  WBC 8.9 9.9  HGB 10.7* 10.9*  HCT 31.7* 32.4*  PLT 221 191   BMET:  Basename 09/09/12 0344 09/08/12 0325  NA 134* 132*  K 3.5 3.5  CL 97 95*  CO2 32 31  GLUCOSE 103* 143*  BUN 13 11  CREATININE 0.61 0.59  CALCIUM 8.3* 8.4    CBG (last 3)   Basename 09/09/12 0730 09/09/12 0312 09/09/12 0007  GLUCAP 103* 84 95   ABG    Component Value Date/Time   PHART 7.385 09/05/2012 1933   HCO3 23.8 09/05/2012 1933   TCO2 25 09/06/2012 1634   ACIDBASEDEF 1.0 09/05/2012 1933   O2SAT 96.0 09/05/2012 1933   CXR: n/a  Assessment/Plan: S/P Procedure(s) (LRB): CORONARY ARTERY BYPASS GRAFTING (CABG) (N/A) INTRAOPERATIVE TRANSESOPHAGEAL ECHOCARDIOGRAM (N/A)  Overall doing well w/ rapid recovery from what appears to have been a fairly minor stroke Expected post op acute blood loss anemia, mild, stable Expected post op volume excess, mild, diuresing a little Type II  diabetes mellitus, fairly good glycemic control Hypertension, well-controlled   Mobilize  PT/OT  Diuresis  Continue lantus + sliding scale insulin for now, change CBG's to AC/HS  Potentially ready for transfer to inpatient rehab service at any time   Rexene Alberts 09/09/2012 8:19 AM

## 2012-09-09 NOTE — Progress Notes (Signed)
Patient transferred from Unit 23[address] to [address], after report was given to receiving RN, Colletta Maryland.  Patient's medications, chart, & personal belongings all sent with patient.  Husband called and informed regarding transfer.  SMITH, Krystie Leiter A, RN.

## 2012-09-09 NOTE — Evaluation (Signed)
[facility] Acute Rehabilitation [phone] (pager)

## 2012-09-09 NOTE — Progress Notes (Signed)
Stroke Team Progress Note  HISTORY Maleena Remund is an 78 y.o. female who underwent CABG on 09/05/12. On today was able to ambulate with assistance this morning. Was checked by nursing at 0815 and was noted at that time to be leaning to the right and have right sided weakness. Consult was called at that time. Patient has pacing wires in place and will not bea ble to have a MRI. Is allergic to contrast dye.   LSN: [id number]  tPA Given: No: Recent CABG   SUBJECTIVE   Sitting up in bed with husband in room. Able to move both sides equally. Feels like she is back to normal. Working with therapy earlier in the days she says.   OBJECTIVE Most recent Vital Signs: Filed Vitals:   09/09/12 0600 09/09/12 0700 09/09/12 0726 09/09/12 0800  BP: 135/69 94/61  120/56  Pulse: 93 101  103  Temp:   97.6 F (36.4 C)   TempSrc:   Oral   Resp: 15 21  21   Height:      Weight:      SpO2: 99% 95%  97%   CBG (last 3)   Basename 09/09/12 0730 09/09/12 0312 09/09/12 0007  GLUCAP 103* 84 95    IV Fluid Intake:      . [DISCONTINUED] sodium chloride 20 mL/hr at 09/06/12 2309  . [DISCONTINUED] sodium chloride    . [DISCONTINUED] nitroGLYCERIN 10 mcg/min (09/06/12 1300)    MEDICATIONS     . acetaminophen  1,000 mg Oral Q6H  . aspirin EC  325 mg Oral Daily  . bisacodyl  10 mg Oral Daily   Or  . bisacodyl  10 mg Rectal Daily  . cloNIDine  0.2 mg Transdermal Weekly  . docusate sodium  200 mg Oral Daily  . enoxaparin  30 mg Subcutaneous Q24H  . furosemide  40 mg Oral BID  . insulin aspart  0-24 Units Subcutaneous TID AC & HS  . insulin glargine  24 Units Subcutaneous BID  . levothyroxine  125 mcg Oral Q breakfast  . metoprolol tartrate  25 mg Oral BID  . moving right along book   Does not apply Once  . pantoprazole  40 mg Oral Daily  . potassium chloride  10 mEq Intravenous Q1 Hr x 3  . potassium chloride  20 mEq Oral BID  . sodium chloride  3 mL Intravenous Q12H  . [DISCONTINUED] insulin  aspart  0-24 Units Subcutaneous Q4H  . [DISCONTINUED] insulin aspart  0-24 Units Subcutaneous Q4H  . [DISCONTINUED] insulin glargine  20 Units Subcutaneous BID  . [DISCONTINUED] metoprolol tartrate  25 mg Oral BID  . [DISCONTINUED] potassium chloride  20 mEq Oral Daily  . [DISCONTINUED] sodium chloride  3 mL Intravenous Q12H   PRN:  sodium chloride, lip balm, ondansetron (ZOFRAN) IV, oxyCODONE, [COMPLETED] potassium chloride, sodium chloride, traMADol, [DISCONTINUED] metoprolol, [DISCONTINUED] midazolam, [DISCONTINUED]  morphine injection, [DISCONTINUED] promethazine, [DISCONTINUED] sodium chloride  Diet:  Carb Control   Activity:  Bedrest  DVT Prophylaxis:  lovenox  CLINICALLY SIGNIFICANT STUDIES Basic Metabolic Panel:   Lab [id number] 09/08/12 0325 09/06/12 1635 09/06/12 0345  NA 134* 132* -- --  K 3.5 3.5 -- --  CL 97 95* -- --  CO2 32 31 -- --  GLUCOSE 103* 143* -- --  BUN 13 11 -- --  CREATININE 0.61 0.59 -- --  CALCIUM 8.3* 8.4 -- --  MG -- -- 1.8 2.0  PHOS -- -- -- --   Liver Function  Tests:   Lab 09/03/12 1355  AST 17  ALT 11  ALKPHOS 126*  BILITOT 0.3  PROT 8.6*  ALBUMIN 3.4*   CBC:   Lab 09/09/12 0344 09/08/12 0325  WBC 8.9 9.9  NEUTROABS -- --  HGB 10.7* 10.9*  HCT 31.7* 32.4*  MCV 86.6 86.6  PLT 221 191   Coagulation:   Lab 09/05/12 1339 09/03/12 1355  LABPROT 16.1* 12.8  INR 1.32 0.97   Urinalysis:   Lab 09/03/12 1355  COLORURINE YELLOW  LABSPEC 1.020  PHURINE 5.0  GLUCOSEU NEGATIVE  HGBUR NEGATIVE  BILIRUBINUR NEGATIVE  KETONESUR NEGATIVE  PROTEINUR NEGATIVE  UROBILINOGEN 0.2  NITRITE NEGATIVE  LEUKOCYTESUR SMALL*   Lipid Panel    Component Value Date/Time   CHOL 154 09/09/2012 0344   HgbA1C  Lab Results  Component Value Date   HGBA1C 6.8* 09/03/2012    Urine Drug Screen:   No results found for this basename: labopia,  cocainscrnur,  labbenz,  amphetmu,  thcu,  labbarb    Alcohol Level: No results found for this  basename: ETH:2 in the last 168 hours  Ct Head Wo Contrast 09/08/2012  1.  Subacute left frontoparietal infarct, medially. 2.  Stable atrophy, mild chronic small vessel white matter ischemic changes and old right frontal lobe white matter lacunar infarct.   Ct Head Wo Contrast 11/09/2011  Mild cortical atrophy.  No acute abnormality.     09/08/2012 PORTABLE CHEST -Stable chest radiograph with mild right basilar atelectasis and small right pleural effusion.  No evidence of pneumothorax.  MRI/MRA of the brain   Transesophageal Echocardiogram  intraop 09/05/12 EF 50-55% . No clot  Carotid Doppler  09/03/12 no stenosis  EKG  Normal sinus rhythmNonspecific T wave abnormality  Therapy Recommendations  pending  Physical Exam  Pleasant elderly caucasian lady not in distress.Rt IJ central line and midline postCABG dressing.Awake alert. Afebrile. Head is nontraumatic. Neck is supple without bruit. Hearing is normal. Cardiac exam no murmur or gallop. Lungs are clear to auscultation. Distal pulses are well felt.  Neurological Exam ; Awake alert oriented x 3 normal speech and language. Mild right lower face asymmetry. Tongue midline. Rt UE and LE drift.RUE 3/5 and RLE 2-3/5 strength. LUE normal and LLE 4/5 strength.  Normal sensation . Normal coordination. Plantars upgoing on rt and downgoing on left.   ASSESSMENT Ms. Barby Kanning is a 78 y.o. female presenting with Rt hemiparesis 2 days post CABG Imaging confirms a Left medial frontopareital infarct. Infarct felt to be  embolic secondary to CABG.    On aspirin 325 mg orally every day prior to admission. Now on aspirin 325 mg orally every day for secondary stroke prevention. Patient with resultant  Rt hemiplegia.   Hypertension  Thyroid disease  Hyperlipidemia, LDL 92  Diabetes mellitis, type 2, hgb a1c, 6.8  Breast cancer history-hypercoagulable  Hospital day # 4  TREATMENT/PLAN  Continue aspirin 325 mg orally every day for secondary  stroke prevention.  Mobilize out of bed, PT,OT,St and rehab consults  HGB A1C goal < 6.5  LDL goal < 70 in diabetics,  < 100 non diabetics  Risk factor modification  Joesphine Bare, PAC,  MBA, [facility] Pager: [phone] 09/09/2012 8:28 AM    Patient has been evaluated by me, plan of care developed and patient seen by me.   Antony Contras, MD Medical Director [facility] Pager: [phone] 09/09/2012 11:04 AM

## 2012-09-09 NOTE — Progress Notes (Signed)
4 Days Post-Op Procedure(s) (LRB): CORONARY ARTERY BYPASS GRAFTING (CABG) (N/A) INTRAOPERATIVE TRANSESOPHAGEAL ECHOCARDIOGRAM (N/A) Subjective:  [name] has no complaints this morning.  She just wants to get better.  She suffered a stroke over the weekend with inability to move right side.  This has improved significantly since onset of stroke. No BM, + flatus  Objective: Vital signs in last 24 hours: Temp:  [97.3 F (36.3 C)-98.2 F (36.8 C)] 97.6 F (36.4 C) (12/09 0726) Pulse Rate:  [84-124] 101  (12/09 0700) Cardiac Rhythm:  [-] Sinus tachycardia (12/08 2000) Resp:  [5-22] 21  (12/09 0700) BP: (94-139)/(49-105) 94/61 mmHg (12/09 0700) SpO2:  [87 %-99 %] 95 % (12/09 0700) Weight:  [225 lb 1.4 oz (102.1 kg)] 225 lb 1.4 oz (102.1 kg) (12/09 0500)  Intake/Output from previous day: 12/08 0701 - 12/09 0700 In: [id number] [P.O.:1020; I.V.:420; IV Piggyback:150] Out: 1970 [id number]  General appearance: alert, cooperative and no distress Neurologic: right sided weakness Heart: regular rate and rhythm Lungs: clear to auscultation bilaterally Abdomen: soft, non-tender; bowel sounds normal; no masses,  no organomegaly Extremities: edema trace Wound: clean and dry  Lab Results:  Basename 09/09/12 0344 09/08/12 0325  WBC 8.9 9.9  HGB 10.7* 10.9*  HCT 31.7* 32.4*  PLT 221 191   BMET:  Basename 09/09/12 0344 09/08/12 0325  NA 134* 132*  K 3.5 3.5  CL 97 95*  CO2 32 31  GLUCOSE 103* 143*  BUN 13 11  CREATININE 0.61 0.59  CALCIUM 8.3* 8.4    PT/INR: No results found for this basename: LABPROT,INR in the last 72 hours ABG    Component Value Date/Time   PHART 7.385 09/05/2012 1933   HCO3 23.8 09/05/2012 1933   TCO2 25 09/06/2012 1634   ACIDBASEDEF 1.0 09/05/2012 1933   O2SAT 96.0 09/05/2012 1933   CBG (last 3)   Basename 09/09/12 0312 09/09/12 0007 09/08/12 1925  GLUCAP 84 95 282*    Assessment/Plan: S/P Procedure(s) (LRB): CORONARY ARTERY BYPASS GRAFTING (CABG)  (N/A) INTRAOPERATIVE TRANSESOPHAGEAL ECHOCARDIOGRAM (N/A)  1. CV- NSR tachy, pressure controlled on clonidine, lopressor 2. Pulm- no acute issues, wean oxygen as tolerated, continue IS 3. Neuro- medial frontopareital infarct, Neurology following 4. Acute post operative anemia- Hgb stable at 10.7 5. Volume Overload- weight is elevated since surgery continue diuresis 6. Deconditioning- PT consult placed, will likely need rehab    LOS: 4 days    [name], [name] 09/09/2012

## 2012-09-09 NOTE — Progress Notes (Signed)
IV team unable to place PIV on patient before transfer to Unit 2000.  Patient will be transferred with current right IJ central line.  Dr. Roxy Manns notified and agreed to leaving central line in place.  SMITH, Javeon Macmurray A,RN

## 2012-09-09 NOTE — Progress Notes (Signed)
K+= 3.5 and creat= 0.61 w/ urine o/p > 30cc/hr; TCTS KCL protocol initiated for 3 runs 10 mEq KCL x 3, each over 1 hr.

## 2012-09-09 NOTE — Evaluation (Signed)
Occupational Therapy Evaluation Patient Details Name: [name] MRN: [id number] DOB: 1934-02-27 Today's Date: 09/09/2012 Time: [id number] OT Time Calculation (min): 34 min  OT Assessment / Plan / Recommendation Clinical Impression  This 78 y.o. female admitted for CABG 09/05/12, and on 09/08/12 developed Rt. sided weakness.  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  Pt. demonstrates Rt. hemiparesis; poor sitting balance; poor tolerance for EOB sitting on eval.  Pt. will benefit from OT to maximize safety and independence with BADLs to allow pt. to return to min - mod A level with BADLs.  Husband is able to provide 24 hours supervision/assist, but reports he will be unable to lift her.  On eval, pt requires max - total A for EOB static sitting.  Anticipate she will require min-mod A for transfers after CIR    OT Assessment  Patient needs continued OT Services    Follow Up Recommendations  SNF;Supervision/Assistance - 24 hour    Barriers to Discharge Decreased caregiver support caregiver unable to provide physical assist  Equipment Recommendations  3 in 1 bedside comode;Tub/shower bench    Recommendations for Other Services    Frequency  Min 3X/week    Precautions / Restrictions Precautions Precautions: Sternal;Fall Precaution Comments: Pt with chronic nausea per RN, presented with decreased verbilizations, became cold/clamy, and pale upon sitting EOB after 3-63min, returned to starting point once in supine. Restrictions Weight Bearing Restrictions: No       ADL  Eating/Feeding: Moderate assistance (with Rt. UE ) Where Assessed - Eating/Feeding: Bed level Grooming: Wash/dry hands;Wash/dry face;Teeth care;Brushing hair;Moderate assistance (with Rt. UE) Where Assessed - Grooming: Supine, head of bed up Upper Body Bathing: Moderate assistance Where Assessed - Upper Body Bathing: Supine, head of bed up Lower Body Bathing: Moderate assistance Where Assessed - Lower Body  Bathing: Supine, head of bed up;Rolling right and/or left Upper Body Dressing: Maximal assistance Where Assessed - Upper Body Dressing: Supine, head of bed up;Supported sitting Lower Body Dressing: +1 Total assistance Where Assessed - Lower Body Dressing: Supine, head of bed up;Rolling right and/or left Toilet Transfer: +1 Total assistance (unable to safely perform) Toileting - Clothing Manipulation and Hygiene: +1 Total assistance Where Assessed - Toileting Clothing Manipulation and Hygiene: Rolling right and/or left Transfers/Ambulation Related to ADLs: Unable to attempt sit to stand due to pt. became nauseated, increased HR, and diaphoretic EOB.  Pt. also requiring max A to maintain EOB sitting ADL Comments: Pt. with weakness Rt. UE, which is dominant UE.  Needs assist for use of Rt. UE.  Unable to attempt grooming EOB due to poor tolerance for EOB sitting, and pt. requires max A    OT Diagnosis: Hemiplegia dominant side  OT Problem List: Decreased strength;Decreased activity tolerance;Impaired balance (sitting and/or standing);Impaired vision/perception;Decreased coordination;Decreased knowledge of use of DME or AE;Decreased knowledge of precautions;Cardiopulmonary status limiting activity;Obesity;Impaired UE functional use OT Treatment Interventions: Self-care/ADL training;Neuromuscular education;DME and/or AE instruction;Therapeutic activities;Visual/perceptual remediation/compensation;Patient/family education;Balance training   OT Goals Acute Rehab OT Goals OT Goal Formulation: With patient/family Time For Goal Achievement: 09/23/12 Potential to Achieve Goals: Good ADL Goals Pt Will Perform Eating: with set-up;Sitting, chair (with Rt. UE) ADL Goal: Eating - Progress: Goal set today Pt Will Perform Grooming: with min assist;Sitting, edge of bed ADL Goal: Grooming - Progress: Goal set today Pt Will Perform Upper Body Bathing: with min assist;Sitting, edge of bed ADL Goal: Upper Body  Bathing - Progress: Goal set today Pt Will Perform Lower Body Bathing: with mod assist;Sit to stand from  chair;Sit to stand from bed ADL Goal: Lower Body Bathing - Progress: Goal set today Pt Will Transfer to Toilet: with mod assist;Stand pivot transfer;3-in-1 ADL Goal: Toilet Transfer - Progress: Goal set today Pt Will Perform Toileting - Clothing Manipulation: with max assist;Standing ADL Goal: Toileting - Clothing Manipulation - Progress: Goal set today  Visit Information  Last OT Received On: 09/09/12 Assistance Needed: +2    Subjective Data  Subjective: "I find it hard to breathe" sitting EOB Patient Stated Goal: To get back to normal   Prior [place] Lives With: Spouse Available Help at Discharge: Family;Friend(s);Available 24 hours/day Type of Home: House Home Access: Stairs to enter [employer] of Steps: 3 Entrance Stairs-Rails: None Home Layout: One level Bathroom Shower/Tub: Chiropodist: Standard Home Adaptive Equipment: None Additional Comments: Husband reports he will provide 24 hour care, but when asked about physical assist, he reports he wouldn't be able to lift her and states he will call the neighbors or 911 Prior Function Level of Independence: Independent Able to Take Stairs?: Yes Driving: Yes Vocation: Retired Comments: Per Therapist, sports, pt did all house hold chores prior to admission Communication Communication: No difficulties Dominant Hand: Right         Vision/Perception Vision - Assessment Eye Alignment: Within Functional Limits Vision Assessment: Vision tested Ocular Range of Motion: Within Functional Limits Additional Comments: Pt need further visual testing.  Unable to complete due to IV RN in to pull PICC line Perception Perception: Within Functional Limits Praxis Praxis: Intact   Cognition  Overall Cognitive Status: Appears within functional limits for tasks  assessed/performed Arousal/Alertness: Awake/alert Orientation Level: Oriented X4 / Intact Behavior During Session: [facility] for tasks performed Cognition - Other Comments: When pt. moved to sitting, responses became delayed    Extremity/Trunk Assessment Right Upper Extremity Assessment RUE ROM/Strength/Tone: Deficits RUE ROM/Strength/Tone Deficits: shoulder 3-/5; elbow distally 3/5 - 3+/5 RUE Sensation: WFL - Light Touch RUE Coordination: Deficits RUE Coordination Deficits: due to weakness Left Upper Extremity Assessment LUE ROM/Strength/Tone: WFL for tasks assessed LUE Sensation: WFL - Light Touch LUE Coordination: WFL - gross/fine motor Right Lower Extremity Assessment RLE ROM/Strength/Tone: Deficits RLE Sensation: Deficits RLE Sensation Deficits: Decr sensation right foot, improved sensory to pressure Left Lower Extremity Assessment LLE ROM/Strength/Tone: WFL for tasks assessed LLE Sensation: WFL - Light Touch Trunk Assessment Trunk Assessment: Other exceptions Trunk Exceptions: Pt leans heavily to Rt. when seated EOB; Rt. lateral flexion.  Pt. does not spontaneouly attempt to right posture     Mobility Bed Mobility Bed Mobility: Rolling Right;Right Sidelying to Sit;Sitting - Scoot to [employer] of Bed;Sit to Supine;Scooting to [facility] Rolling Right: 2: Max assist Right Sidelying to Sit: 1: +2 Total assist;HOB elevated Right Sidelying to Sit: Patient Percentage: 10% Sitting - Scoot to Edge of Bed: 2: Max assist Sit to Supine: 1: +2 Total assist Sit to Supine: Patient Percentage: 20% Scooting to HOB: 1: +2 Total assist Scooting to [facility]: Patient Percentage: 0% Details for Bed Mobility Assistance: cues for positioning and sequencing.     Shoulder Instructions     Exercise     Balance Balance Balance Assessed: Yes Static Sitting Balance Static Sitting - Balance Support: Bilateral upper extremity supported;Feet supported Static Sitting - Level of Assistance: 1: +1 Total assist Static  Sitting - Comment/# of Minutes: 5 min sitting EOB, pt leaned heavily to right, unable to center self.  Pt's HR increased and she was in chronic tachycardia increased to 130s, became  cold, clamy, nauseated, palor, c/o increased wob, and stopped talking with slowed response to questions.  Return to supine, pt's HR returned to 120 (starting HR), she began talking quickly and fluently, color returned to face.  RN called to the room at time of decline; RN stated this is not abnormal for this pt.     End of Session OT - End of Session Activity Tolerance: Other (comment) (nausea; increased HR; slow responses) Patient left: in bed;with call bell/phone within reach Nurse Communication: Other (comment) (finding on eval)  GO     Gonzalo Waymire M 09/09/2012, 1:40 PM

## 2012-09-09 NOTE — Evaluation (Addendum)
Physical Therapy Evaluation Patient Details Name: [name] MRN: [id number] DOB: August 24, 1934 Today's Date: 09/09/2012 Time: [id number] PT Time Calculation (min): 34 min  PT Assessment / Plan / Recommendation Clinical Impression  Pt s/p CABG on 09/05/12 and L CVA on 09/08/12 presents with right sided weakness, tachycardia, decr mobility, strength, and activity tolerance.  Supine to sit brought on adverse symptoms of incr HR and decr pt response, and recovered within 89min of returning to supine (see balance note below).  Pt will benefit from skilled physical therapy to address these limitations.  Recommend SNF at this time.     PT Assessment  Patient needs continued PT services    Follow Up Recommendations  SNF       Barriers to Discharge Decreased caregiver support Husband at home, but ?ability to physically assist      Equipment Recommendations  Rolling walker with 5" wheels       Frequency Min 4X/week    Precautions / Restrictions Precautions Precautions: Sternal;Fall Precaution Comments: Pt with chronic nausea per RN, presented with decreased verbilizations, became cold/clammy, and pale upon sitting EOB after 3-45min, returned to starting point once in supine. Restrictions Weight Bearing Restrictions: No   Pertinent Vitals/Pain HR: 120 resting in supine, 120-131 with activity RR: 22-26 O2%: 94-99% on RA BP: beginning 135/74 (supine), mid 136/84 and 133/92 (sitting), end 120/99 (supine) Denies pain     Mobility  Bed Mobility Bed Mobility: Rolling Right;Right Sidelying to Sit;Sitting - Scoot to [employer] of Bed;Sit to Supine;Scooting to [facility] Rolling Right: 2: Max assist Right Sidelying to Sit: 1: +2 Total assist;HOB elevated Right Sidelying to Sit: Patient Percentage: 10% Sitting - Scoot to Edge of Bed: 2: Max assist Sit to Supine: 1: +2 Total assist Sit to Supine: Patient Percentage: 20% Scooting to HOB: 1: +2 Total assist Scooting to [facility]: Patient Percentage: 0% Details  for Bed Mobility Assistance: cues for positioning and sequencing. Transfers Transfers: Not assessed Ambulation/Gait Ambulation/Gait Assistance: Not tested (comment) Stairs: No Wheelchair Mobility Wheelchair Mobility: No Modified Rankin (Stroke Patients Only) Pre-Morbid Rankin Score: No symptoms Modified Rankin: Severe disability           PT Diagnosis: Hemiplegia dominant side  PT Problem List: Decreased strength;Decreased range of motion;Decreased activity tolerance;Decreased balance;Decreased mobility;Decreased coordination;Decreased knowledge of use of DME;Decreased safety awareness;Decreased knowledge of precautions PT Treatment Interventions: DME instruction;Gait training;Stair training;Functional mobility training;Therapeutic activities;Therapeutic exercise;Balance training;Neuromuscular re-education;Patient/family education   PT Goals Acute Rehab PT Goals PT Goal Formulation: With patient Time For Goal Achievement: 09/23/12 Potential to Achieve Goals: Good Pt will Roll Supine to Right Side: with modified independence PT Goal: Rolling Supine to Right Side - Progress: Goal set today Pt will go Supine/Side to Sit: with min assist PT Goal: Supine/Side to Sit - Progress: Goal set today Pt will Sit at Edge of Bed: with min assist;with bilateral upper extremity support;3-5 min PT Goal: Sit at [employer] Of Bed - Progress: Goal set today Pt will go Sit to Supine/Side: with supervision PT Goal: Sit to Supine/Side - Progress: Goal set today Pt will Transfer Bed to Chair/Chair to Bed: with min assist PT Transfer Goal: Bed to Chair/Chair to Bed - Progress: Goal set today Pt will Stand: with min assist PT Goal: Stand - Progress: Goal set today Pt will Ambulate: 16 - 50 feet;with max assist;with least restrictive assistive device PT Goal: Ambulate - Progress: Goal set today  Visit Information  Last PT Received On: 09/09/12 Assistance Needed: +2 PT/OT Co-Evaluation/Treatment: Yes  Subjective Data  Subjective: "I feel one sided now" Patient Stated Goal: To return home   Prior [place] With: Spouse Available Help at Discharge: Family;Friend(s);Available 24 hours/day (husband at home but may need help physically; Mrs. Haberle) Type of Home: House Home Access: Stairs to enter Entrance Stairs-Number of Steps: 3 (3 on back, 1 on front) Entrance Stairs-Rails: None Home Layout: One level Bathroom Shower/Tub: Chiropodist: Standard Home Adaptive Equipment: None Prior Function Level of Independence: Independent Able to Take Stairs?: Yes Driving: Yes Vocation: Retired Comments: Per Therapist, sports, pt did all house hold chores prior to admission Communication Communication: No difficulties Dominant Hand: Right    Cognition  Overall Cognitive Status: Appears within functional limits for tasks assessed/performed Arousal/Alertness: Awake/alert Orientation Level: Oriented X4 / Intact Behavior During Session: WFL for tasks performed Cognition - Other Comments: Pt with above presentation when in supine, upon sitting EOB developed flat affect    Extremity/Trunk Assessment Right Upper Extremity Assessment RUE ROM/Strength/Tone: Deficits RUE Sensation: WFL - Light Touch Left Upper Extremity Assessment LUE ROM/Strength/Tone: WFL for tasks assessed LUE Sensation: WFL - Light Touch Right Lower Extremity Assessment RLE ROM/Strength/Tone: Deficits RLE Sensation: Deficits RLE Sensation Deficits: Decr sensation right foot, improved sensory to pressure Left Lower Extremity Assessment LLE ROM/Strength/Tone: WFL for tasks assessed LLE Sensation: WFL - Light Touch   Balance Balance Balance Assessed: Yes Static Sitting Balance Static Sitting - Balance Support: Bilateral upper extremity supported;Feet supported Static Sitting - Level of Assistance: 1: +1 Total assist Static Sitting - Comment/# of Minutes: 5 min sitting EOB, pt leaned heavily  to right, unable to center self.  Pt's HR increased and she was in chronic tachycardia increased to 130s, became cold, clamy, nauseated, palor, c/o increased wob, and stopped talking with slowed response to questions.  Return to supine, pt's HR returned to 120 (starting HR), she began talking quickly and fluently, color returned to face.  RN called to the room at time of decline; RN stated this is not abnormal for this pt.     End of Session PT - End of Session Activity Tolerance: Patient limited by fatigue;Treatment limited secondary to medical complications (Comment) Patient left: in bed;with call bell/phone within reach;with nursing in room;with family/visitor present Nurse Communication: Mobility status       Zannie Kehr 09/09/2012, 12:53 PM Zannie Kehr, SPT Acute Rehab Services [phone]  [facility] Acute Rehabilitation [phone] (pager)

## 2012-09-09 NOTE — Progress Notes (Signed)
Contacted Dr. Servando Snare d/w Pt's CBG = 282 updated on improved P.O. intake and current medication regime' order given/recieved to incr BID lantus and not to re-start glucosestabilizer and insulin drip. Will cont' to monitor and assess.

## 2012-09-09 NOTE — Progress Notes (Signed)
Patient's pacing wires discontinued, per Dr. Guy Sandifer orders.  Minimal ectopy upon removal of pacing wires.  Patient's vital signs stable and patient kept on bedrest for the first hour.

## 2012-09-09 NOTE — Consult Note (Signed)
Physical Medicine and Rehabilitation Consult Reason for Consult: CVA Referring Physician: Dr. Roxy Manns   HPI: [name] is a 78 y.o. right-handed female with history of coronary artery disease, hypertension, type 2 diabetes mellitus and remote tobacco abuse and breast cancer last fall which she underwent lumpectomy followed by radiation therapy. She did undergo adjunct chemotherapy.. Admitted 09/03/2012 with shortness of breath on exertion and tightness in her chest. Cardiac catheterization showed 70% stenosis of the left main coronary artery with severe three-vessel coronary artery disease with preserved left ventricular function. Underwent coronary artery bypass grafting x4 09/05/2012 per Dr. Roxy Manns. Postoperative day 2 with noted right-sided weakness. CT scan imaging showed left medial frontal parietal infarct felt to be embolic secondary to CABG. Neurology services consulted maintained on aspirin therapy as well as the addition of subcutaneous Lovenox for DVT prophylaxis. Patient did not receive TPA. Physical and occupational therapy evaluations are pending. M.D. is requested physical medicine rehabilitation consult to consider inpatient rehabilitation services   Review of Systems  Respiratory: Positive for cough and shortness of breath.   Cardiovascular: Positive for chest pain and palpitations.  Gastrointestinal: Positive for heartburn.  All other systems reviewed and are negative.   Past Medical History  Diagnosis Date  . Breast mass, right   . Vertigo   . Thyroid disease   . History of breast cancer     39 treatments of radiation. Negative chemo.  . Diabetes   . GERD (gastroesophageal reflux disease)   . Hyperlipemia   . History of seasonal allergies   . Arthritis   . Angina pectoris 08/26/2012  . Complication of anesthesia   . PONV (postoperative nausea and vomiting)   . Hypertension     sees Dr. Fulton Reek  . Coronary artery disease 08/22/2012    sees Dr Rockey Situ  .  Pneumonia     hx of  . Headache     migraines  . Cancer     breast cancer, right side  . Gastritis     hx of  . S/P CABG x 4 09/05/2012    LIMA to LAD, SVG to Diag, SVG to OM1, SVG to PDA, EVH from bilateral thighs   Past Surgical History  Procedure Date  . Abdominal hysterectomy   . Ovarian cyst removal   . Back surgery   . Appendectomy   . Breast surgery   . Laparoscopic nissen fundoplication   . Coronary artery bypass graft 09/05/2012    Procedure: CORONARY ARTERY BYPASS GRAFTING (CABG);  Surgeon: Rexene Alberts, MD;  Location: [facility];  Service: Open Heart Surgery;  Laterality: N/A;  CABG x four, using left internal mammary artery and bilateral greater saphenous vein harvested endoscopically  . Intraoperative transesophageal echocardiogram 09/05/2012    Procedure: INTRAOPERATIVE TRANSESOPHAGEAL ECHOCARDIOGRAM;  Surgeon: Rexene Alberts, MD;  Location: [facility];  Service: Open Heart Surgery;  Laterality: N/A;   Family History  Problem Relation Age of Onset  . Heart disease Brother     CABG & stents  . Hyperlipidemia Brother   . Hypertension Brother   . Cancer Father   . Heart disease Mother    Social History:  reports that she quit smoking about 23 years ago. Her smoking use included Cigarettes. She does not have any smokeless tobacco history on file. She reports that she does not drink alcohol or use illicit drugs. Allergies:  Allergies  Allergen Reactions  . Ciprocinonide (Fluocinolone)     Feels crazy  . Darvocet (Propoxyphene-Acetaminophen)  Difficulty breathing and swallowing  . Ivp Dye (Iodinated Diagnostic Agents)     Nausea & vomiting  . Other     Reports all cholesterol medication  . Statins     Muscle aches   Medications Prior to Admission  Medication Sig Dispense Refill  . cloNIDine (CATAPRES) 0.2 MG tablet Take 0.2 mg by mouth 2 (two) times daily.      Marland Kitchen glimepiride (AMARYL) 2 MG tablet Take 2 mg by mouth daily before breakfast.      . ibuprofen  (ADVIL,MOTRIN) 200 MG tablet Take 800 mg by mouth every 6 (six) hours as needed. For pain      . letrozole (FEMARA) 2.5 MG tablet Take 2.5 mg by mouth daily.      Marland Kitchen levothyroxine (SYNTHROID, LEVOTHROID) 125 MCG tablet Take 125 mcg by mouth daily.      . metoprolol tartrate (LOPRESSOR) 25 MG tablet Take 25 mg by mouth 2 (two) times daily.      Marland Kitchen aspirin 325 MG EC tablet Take 325 mg by mouth daily.      . nitroGLYCERIN (NITROSTAT) 0.4 MG SL tablet Place 0.4 mg under the tongue every 5 (five) minutes as needed. For chest pains        Home:    Functional History:   Functional Status:  Mobility:          ADL:    Cognition: Cognition Orientation Level: Oriented X4    Blood pressure 133/60, pulse 95, temperature 98.2 F (36.8 C), temperature source Oral, resp. rate 17, height 5\' 5"  (1.651 m), weight 102.1 kg (225 lb 1.4 oz), SpO2 99.00%. Physical Exam  Vitals reviewed. Constitutional: She is oriented to person, place, and time.  HENT:  Head: Normocephalic.  Eyes:       Pupils round and reactive to light  Neck: Neck supple. No thyromegaly present.  Cardiovascular:       Cardiac rate controlled  Pulmonary/Chest: Breath sounds normal. No respiratory distress. She has no wheezes.  Abdominal: Soft. Bowel sounds are normal. She exhibits no distension.  Neurological: She is alert and oriented to person, place, and time.       Follows full commands. Minimal right facial droop. RUE is 4/5. RLE is 2/5 proximally to 3/5 distally. No gross sensory deficits.   Skin: Skin is warm and dry.  Psychiatric: She has a normal mood and affect.    Results for orders placed during the hospital encounter of 09/05/12 (from the past 24 hour(s))  GLUCOSE, CAPILLARY     Status: Abnormal   Collection Time   09/08/12  8:21 AM      Component Value Range   Glucose-Capillary 113 (*) 70 - 99 mg/dL   Comment 1 Documented in Chart     Comment 2 Notify RN    GLUCOSE, CAPILLARY     Status: Abnormal    Collection Time   09/08/12 12:05 PM      Component Value Range   Glucose-Capillary 124 (*) 70 - 99 mg/dL   Comment 1 Documented in Chart     Comment 2 Notify RN    GLUCOSE, CAPILLARY     Status: Abnormal   Collection Time   09/08/12  3:59 PM      Component Value Range   Glucose-Capillary 171 (*) 70 - 99 mg/dL  GLUCOSE, CAPILLARY     Status: Abnormal   Collection Time   09/08/12  7:25 PM      Component Value Range   Glucose-Capillary  282 (*) 70 - 99 mg/dL   Comment 1 Notify RN     Comment 2 Documented in Chart    GLUCOSE, CAPILLARY     Status: Normal   Collection Time   09/09/12 12:07 AM      Component Value Range   Glucose-Capillary 95  70 - 99 mg/dL   Comment 1 Notify RN     Comment 2 Documented in Chart    GLUCOSE, CAPILLARY     Status: Normal   Collection Time   09/09/12  3:12 AM      Component Value Range   Glucose-Capillary 84  70 - 99 mg/dL   Comment 1 Notify RN     Comment 2 Documented in Chart    BASIC METABOLIC PANEL     Status: Abnormal   Collection Time   09/09/12  3:44 AM      Component Value Range   Sodium 134 (*) 135 - 145 mEq/L   Potassium 3.5  3.5 - 5.1 mEq/L   Chloride 97  96 - 112 mEq/L   CO2 32  19 - 32 mEq/L   Glucose, Bld 103 (*) 70 - 99 mg/dL   BUN 13  6 - 23 mg/dL   Creatinine, Ser 0.61  0.50 - 1.10 mg/dL   Calcium 8.3 (*) 8.4 - 10.5 mg/dL   GFR calc non Af Amer 86 (*) >90 mL/min   GFR calc Af Amer >90  >90 mL/min  CBC     Status: Abnormal   Collection Time   09/09/12  3:44 AM      Component Value Range   WBC 8.9  4.0 - 10.5 K/uL   RBC 3.66 (*) 3.87 - 5.11 MIL/uL   Hemoglobin 10.7 (*) 12.0 - 15.0 g/dL   HCT 31.7 (*) 36.0 - 46.0 %   MCV 86.6  78.0 - 100.0 fL   MCH 29.2  26.0 - 34.0 pg   MCHC 33.8  30.0 - 36.0 g/dL   RDW 14.3  11.5 - 15.5 %   Platelets 221  150 - 400 K/uL  LIPID PANEL     Status: Abnormal   Collection Time   09/09/12  3:44 AM      Component Value Range   Cholesterol 154  0 - 200 mg/dL   Triglycerides 157 (*) <150 mg/dL    HDL 31 (*) >39 mg/dL   Total CHOL/HDL Ratio 5.0     VLDL 31  0 - 40 mg/dL   LDL Cholesterol 92  0 - 99 mg/dL   Ct Head Wo Contrast  09/08/2012  *RADIOLOGY REPORT*  Clinical Data: Progressive right-sided weakness and dysarthria.  CT HEAD WITHOUT CONTRAST  Technique:  Contiguous axial images were obtained from the base of the skull through the vertex without contrast.  Comparison: Earlier today.  Findings: Interval ill-defined low density in the left frontoparietal region, medially.  No intracranial hemorrhage or mass effect.  Stable mildly enlarged ventricles and subarachnoid spaces.  Stable old right frontal lobe white matter infarct and mild patchy white matter low density in both cerebral hemispheres. Unremarkable bones and included paranasal sinuses.  IMPRESSION:  1.  Subacute left frontoparietal infarct, medially. 2.  Stable atrophy, mild chronic small vessel white matter ischemic changes and old right frontal lobe white matter lacunar infarct.  These results were called by telephone on 09/08/2012 at 0100 hours to [facility], the patient's nurse, who verbally acknowledged these results. She stated that she would inform Dr. Leonel Ramsay.   Original  Report Authenticated By: Claudie Revering, M.D.    Ct Head Wo Contrast  09/07/2012  *RADIOLOGY REPORT*  Clinical Data: Right side weakness.  CT HEAD WITHOUT CONTRAST  Technique:  Contiguous axial images were obtained from the base of the skull through the vertex without contrast.  Comparison: None.  Findings: Mild cortical atrophy is seen.  There is no evidence of acute intracranial abnormality including infarct, hemorrhage, mass lesion, mass effect, midline shift or abnormal extra-axial fluid collection.  No hydrocephalus or pneumocephalus.  Carotid atherosclerosis is noted.  IMPRESSION: Mild cortical atrophy.  No acute abnormality.   Original Report Authenticated By: Orlean Patten, M.D.    Dg Chest Port 1 View  09/08/2012  *RADIOLOGY REPORT*  Clinical Data:  78 year old female - postop day #3 CABG.  PORTABLE CHEST - 1 VIEW  Comparison: 09/07/2012 and prior chest radiographs dating back to 09/03/2012  Findings: Cardiomegaly and CABG changes again noted.  Right basilar atelectasis and small right pleural effusion are again noted. There is no evidence of pneumothorax or pulmonary edema. A right IJ central venous catheter is again noted with tip overlying the IJ - brachiocephalic junction. No significant changes are identified.  IMPRESSION: Stable chest radiograph with mild right basilar atelectasis and small right pleural effusion.  No evidence of pneumothorax.   Original Report Authenticated By: Margarette Canada, M.D.    Dg Chest Portable 1 View In Am  09/07/2012  *RADIOLOGY REPORT*  Clinical Data: Postop cardiac surgery.  PORTABLE CHEST - 1 VIEW  Comparison: 09/06/2012  Findings: Swan-Ganz catheter has been removed.  Right jugular central line is still present.  Chest drains have been removed.  No evidence for a pneumothorax.  Patchy densities at the right lung base are suggestive for atelectasis.  Heart size is large.  Median sternotomy wires are present.  IMPRESSION: Removal of chest drains without a pneumothorax.  Right basilar atelectasis.   Original Report Authenticated By: Markus Daft, M.D.     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 Does the need for close, 24 hr/day medical supervision in concert with the patient's rehab needs make it unreasonable for this patient to be served in a less intensive setting? Yes 2. Co-Morbidities requiring supervision/potential complications: CAD, DM, HTN, GERD 3. Due to bladder management, bowel management, safety, skin/wound care, disease management, medication administration, pain management and patient education, does the patient require 24 hr/day rehab nursing? Yes 4. Does the patient require coordinated care of a physician, rehab nurse, PT (1-2 hrs/day, 5 days/week) and OT (1-2 hrs/day, 5 days/week) to  address physical and functional deficits in the context of the above medical diagnosis(es)? Yes Addressing deficits in the following areas: balance, endurance, locomotion, strength, transferring, bowel/bladder control, bathing, dressing, feeding, grooming, toileting and psychosocial support 5. Can the patient actively participate in an intensive therapy program of at least 3 hrs of therapy per day at least 5 days per week? Yes 6. The potential for patient to make measurable gains while on inpatient rehab is excellent 7. Anticipated functional outcomes upon discharge from inpatient rehab are mod I to supervision with PT, mod I to supervision with OT, n/a with SLP. 8. Estimated rehab length of stay to reach the above functional goals is: 7-10 days 9. Does the patient have adequate social supports to accommodate these discharge functional goals? Yes 10. Anticipated D/C setting: Home 11. Anticipated post D/C treatments: [facility] therapy 12. Overall Rehab/Functional Prognosis: excellent  RECOMMENDATIONS: This patient's condition is appropriate for continued rehabilitative care in the following  setting: CIR Patient has agreed to participate in recommended program. Yes Note that insurance prior authorization may be required for reimbursement for recommended care.  Comment:Rehab RN to follow up.   Oval Linsey, MD     09/09/2012

## 2012-09-10 ENCOUNTER — Inpatient Hospital Stay (HOSPITAL_COMMUNITY)
Admission: RE | Admit: 2012-09-10 | Discharge: 2012-09-23 | DRG: 945 | Disposition: A | Discharge status: Home Health | Payer: Medicare Other | Source: Ambulatory Visit | Attending: Physical Medicine & Rehabilitation | Admitting: Physical Medicine & Rehabilitation

## 2012-09-10 DIAGNOSIS — G4700 Insomnia, unspecified: Secondary | ICD-10-CM | POA: Diagnosis present

## 2012-09-10 DIAGNOSIS — E785 Hyperlipidemia, unspecified: Secondary | ICD-10-CM | POA: Diagnosis present

## 2012-09-10 DIAGNOSIS — I6340 Cerebral infarction due to embolism of unspecified cerebral artery: Secondary | ICD-10-CM

## 2012-09-10 DIAGNOSIS — I10 Essential (primary) hypertension: Secondary | ICD-10-CM | POA: Diagnosis present

## 2012-09-10 DIAGNOSIS — Z853 Personal history of malignant neoplasm of breast: Secondary | ICD-10-CM

## 2012-09-10 DIAGNOSIS — D62 Acute posthemorrhagic anemia: Secondary | ICD-10-CM | POA: Diagnosis present

## 2012-09-10 DIAGNOSIS — K5900 Constipation, unspecified: Secondary | ICD-10-CM | POA: Diagnosis present

## 2012-09-10 DIAGNOSIS — Z87891 Personal history of nicotine dependence: Secondary | ICD-10-CM | POA: Diagnosis not present

## 2012-09-10 DIAGNOSIS — Z5189 Encounter for other specified aftercare: Principal | ICD-10-CM

## 2012-09-10 DIAGNOSIS — E119 Type 2 diabetes mellitus without complications: Secondary | ICD-10-CM | POA: Diagnosis present

## 2012-09-10 DIAGNOSIS — G8190 Hemiplegia, unspecified affecting unspecified side: Secondary | ICD-10-CM

## 2012-09-10 DIAGNOSIS — Z951 Presence of aortocoronary bypass graft: Secondary | ICD-10-CM

## 2012-09-10 DIAGNOSIS — I2510 Atherosclerotic heart disease of native coronary artery without angina pectoris: Secondary | ICD-10-CM | POA: Diagnosis present

## 2012-09-10 LAB — CBC
HCT: 33 % — ABNORMAL LOW (ref 36.0–46.0)
Hemoglobin: 11.2 g/dL — ABNORMAL LOW (ref 12.0–15.0)
MCH: 29.5 pg (ref 26.0–34.0)
MCHC: 33.9 g/dL (ref 30.0–36.0)
MCV: 86.8 fL (ref 78.0–100.0)
Platelets: 289 10*3/uL (ref 150–400)
RBC: 3.8 MIL/uL — ABNORMAL LOW (ref 3.87–5.11)
RDW: 14.2 % (ref 11.5–15.5)
WBC: 9.5 10*3/uL (ref 4.0–10.5)

## 2012-09-10 LAB — BASIC METABOLIC PANEL
BUN: 15 mg/dL (ref 6–23)
CO2: 31 mEq/L (ref 19–32)
Calcium: 8.6 mg/dL (ref 8.4–10.5)
Chloride: 98 mEq/L (ref 96–112)
Creatinine, Ser: 0.72 mg/dL (ref 0.50–1.10)
GFR calc Af Amer: >90 mL/min (ref >90)
GFR calc non Af Amer: 81 mL/min — ABNORMAL LOW (ref >90)
Glucose, Bld: 127 mg/dL — ABNORMAL HIGH (ref 70–99)
Potassium: 3.7 mEq/L (ref 3.5–5.1)
Sodium: 135 mEq/L (ref 135–145)

## 2012-09-10 LAB — GLUCOSE, CAPILLARY
Glucose-Capillary: 139 mg/dL — ABNORMAL HIGH (ref 70–99)
Glucose-Capillary: 114 mg/dL — ABNORMAL HIGH (ref 70–99)
Glucose-Capillary: 147 mg/dL — ABNORMAL HIGH (ref 70–99)
Glucose-Capillary: 119 mg/dL — ABNORMAL HIGH (ref 70–99)

## 2012-09-10 MED ORDER — FLEET ENEMA 7-19 GM/118ML RE ENEM: 1.0000 | ENEMA | Freq: Every day | RECTAL | Status: DC | PRN

## 2012-09-10 MED ORDER — POTASSIUM CHLORIDE CRYS ER 20 MEQ PO TBCR
20.0000 meq | EXTENDED_RELEASE_TABLET | Freq: Two times a day (BID) | ORAL | Status: DC
  Administered 2012-09-10 – 2012-09-23 (×26): 20 meq via ORAL
  Filled 2012-09-10 (×30): qty 1

## 2012-09-10 MED ORDER — LEVOTHYROXINE SODIUM 125 MCG PO TABS
125.0000 ug | ORAL_TABLET | Freq: Every day | ORAL | Status: DC
  Administered 2012-09-11 – 2012-09-23 (×13): 125 ug via ORAL
  Filled 2012-09-10 (×14): qty 1

## 2012-09-10 MED ORDER — INSULIN GLARGINE 100 UNIT/ML ~~LOC~~ SOLN: 24.0000 [IU] | Freq: Two times a day (BID) | SUBCUTANEOUS | Status: DC

## 2012-09-10 MED ORDER — PANTOPRAZOLE SODIUM 40 MG PO TBEC
40.0000 mg | DELAYED_RELEASE_TABLET | Freq: Every day | ORAL | Status: DC
  Administered 2012-09-11 – 2012-09-23 (×13): 40 mg via ORAL
  Filled 2012-09-10 (×13): qty 1

## 2012-09-10 MED ORDER — GUAIFENESIN-DM 100-10 MG/5ML PO SYRP: 5.0000 mL | ORAL_SOLUTION | Freq: Four times a day (QID) | ORAL | Status: DC | PRN

## 2012-09-10 MED ORDER — SODIUM CHLORIDE 0.9 % IJ SOLN
10.0000 mL | INTRAMUSCULAR | Status: DC | PRN
  Administered 2012-09-10: 10 mL

## 2012-09-10 MED ORDER — PANTOPRAZOLE SODIUM 40 MG PO TBEC: 40.0000 mg | DELAYED_RELEASE_TABLET | Freq: Every day | ORAL | Status: DC

## 2012-09-10 MED ORDER — FUROSEMIDE 40 MG PO TABS: 40.0000 mg | ORAL_TABLET | Freq: Two times a day (BID) | ORAL | Status: DC

## 2012-09-10 MED ORDER — BLISTEX EX OINT
TOPICAL_OINTMENT | CUTANEOUS | Status: DC | PRN
  Filled 2012-09-10: qty 10

## 2012-09-10 MED ORDER — METOPROLOL TARTRATE 50 MG PO TABS
50.0000 mg | ORAL_TABLET | Freq: Two times a day (BID) | ORAL | Status: DC
  Administered 2012-09-10: 50 mg via ORAL
  Filled 2012-09-10 (×2): qty 1

## 2012-09-10 MED ORDER — POLYETHYLENE GLYCOL 3350 17 G PO PACK
17.0000 g | PACK | Freq: Two times a day (BID) | ORAL | Status: DC
  Administered 2012-09-10 – 2012-09-23 (×16): 17 g via ORAL
  Filled 2012-09-10 (×28): qty 1

## 2012-09-10 MED ORDER — ENOXAPARIN SODIUM 40 MG/0.4ML ~~LOC~~ SOLN
40.0000 mg | SUBCUTANEOUS | Status: DC
  Administered 2012-09-10 – 2012-09-22 (×13): 40 mg via SUBCUTANEOUS
  Filled 2012-09-10 (×14): qty 0.4

## 2012-09-10 MED ORDER — ASPIRIN EC 325 MG PO TBEC
325.0000 mg | DELAYED_RELEASE_TABLET | Freq: Every day | ORAL | Status: DC
  Administered 2012-09-11 – 2012-09-23 (×13): 325 mg via ORAL
  Filled 2012-09-10 (×16): qty 1

## 2012-09-10 MED ORDER — POTASSIUM CHLORIDE CRYS ER 20 MEQ PO TBCR: 20.0000 meq | EXTENDED_RELEASE_TABLET | Freq: Two times a day (BID) | ORAL | Status: DC

## 2012-09-10 MED ORDER — LEVOTHYROXINE SODIUM 125 MCG PO TABS
125.0000 ug | ORAL_TABLET | Freq: Every day | ORAL | Status: DC
  Filled 2012-09-10: qty 1

## 2012-09-10 MED ORDER — CLONIDINE HCL 0.2 MG/24HR TD PTWK
0.2000 mg | MEDICATED_PATCH | TRANSDERMAL | Status: DC
  Administered 2012-09-13: 0.2 mg via TRANSDERMAL
  Filled 2012-09-10: qty 1

## 2012-09-10 MED ORDER — DIPHENHYDRAMINE HCL 12.5 MG/5ML PO ELIX: 12.5000 mg | ORAL_SOLUTION | Freq: Four times a day (QID) | ORAL | Status: DC | PRN

## 2012-09-10 MED ORDER — ONDANSETRON HCL 4 MG/2ML IJ SOLN: 4.0000 mg | Freq: Four times a day (QID) | INTRAMUSCULAR | Status: DC | PRN

## 2012-09-10 MED ORDER — FUROSEMIDE 40 MG PO TABS
40.0000 mg | ORAL_TABLET | Freq: Two times a day (BID) | ORAL | Status: DC
  Administered 2012-09-11 – 2012-09-23 (×24): 40 mg via ORAL
  Filled 2012-09-10 (×28): qty 1

## 2012-09-10 MED ORDER — ONDANSETRON HCL 4 MG PO TABS
4.0000 mg | ORAL_TABLET | Freq: Four times a day (QID) | ORAL | Status: DC | PRN
  Administered 2012-09-11 – 2012-09-23 (×4): 4 mg via ORAL
  Filled 2012-09-10 (×4): qty 1

## 2012-09-10 MED ORDER — INSULIN ASPART 100 UNIT/ML ~~LOC~~ SOLN
0.0000 [IU] | Freq: Three times a day (TID) | SUBCUTANEOUS | Status: DC
  Administered 2012-09-11 – 2012-09-21 (×24): 2 [IU] via SUBCUTANEOUS

## 2012-09-10 MED ORDER — METOPROLOL TARTRATE 25 MG PO TABS: 50.0000 mg | ORAL_TABLET | Freq: Two times a day (BID) | ORAL | Status: DC

## 2012-09-10 MED ORDER — TRAMADOL HCL 50 MG PO TABS: 50.0000 mg | ORAL_TABLET | ORAL | Status: DC | PRN

## 2012-09-10 MED ORDER — INSULIN GLARGINE 100 UNIT/ML ~~LOC~~ SOLN
24.0000 [IU] | Freq: Two times a day (BID) | SUBCUTANEOUS | Status: DC
  Administered 2012-09-10 – 2012-09-19 (×18): 24 [IU] via SUBCUTANEOUS

## 2012-09-10 MED ORDER — TRAMADOL HCL 50 MG PO TABS: 50.0000 mg | ORAL_TABLET | Freq: Four times a day (QID) | ORAL | Status: DC | PRN

## 2012-09-10 MED ORDER — CLONIDINE HCL 0.2 MG/24HR TD PTWK: 1.0000 | MEDICATED_PATCH | TRANSDERMAL | Status: DC

## 2012-09-10 MED ORDER — OXYCODONE HCL 5 MG PO TABS
5.0000 mg | ORAL_TABLET | ORAL | Status: DC | PRN
  Administered 2012-09-11: 5 mg via ORAL
  Administered 2012-09-12: 10 mg via ORAL
  Administered 2012-09-12: 5 mg via ORAL
  Administered 2012-09-13 (×2): 10 mg via ORAL
  Administered 2012-09-14 (×2): 5 mg via ORAL
  Administered 2012-09-15 – 2012-09-16 (×2): 10 mg via ORAL
  Filled 2012-09-10 (×6): qty 2
  Filled 2012-09-10: qty 1
  Filled 2012-09-10: qty 2

## 2012-09-10 MED ORDER — BISACODYL 10 MG RE SUPP
10.0000 mg | Freq: Every day | RECTAL | Status: DC | PRN
  Administered 2012-09-14: 10 mg via RECTAL
  Filled 2012-09-10: qty 1

## 2012-09-10 MED ORDER — ALUM & MAG HYDROXIDE-SIMETH 200-200-20 MG/5ML PO SUSP: 30.0000 mL | ORAL | Status: DC | PRN

## 2012-09-10 MED ORDER — TRAZODONE HCL 50 MG PO TABS
25.0000 mg | ORAL_TABLET | Freq: Every evening | ORAL | Status: DC | PRN
  Administered 2012-09-15: 50 mg via ORAL
  Filled 2012-09-10: qty 1

## 2012-09-10 MED ORDER — METOPROLOL TARTRATE 50 MG PO TABS
50.0000 mg | ORAL_TABLET | Freq: Two times a day (BID) | ORAL | Status: DC
  Administered 2012-09-10 – 2012-09-23 (×26): 50 mg via ORAL
  Filled 2012-09-10 (×28): qty 1

## 2012-09-10 NOTE — Progress Notes (Addendum)
[id number] Cardiac Rehab Completed discharge education with pt and husband. They voice understanding. Pt will think about Outpt. CRP, right now she feels overwhelmed about  the CVA. I gave them information about [facility]. CRP.

## 2012-09-10 NOTE — PMR Pre-admission (Signed)
PMR Admission Coordinator Pre-Admission Assessment  Patient: [name] is an 78 y.o., female MRN: [id number] DOB: [birth year] Height: 5\' 5"  (165.1 cm) Weight: 97.433 kg (214 lb 12.8 oz)              Insurance Information HMO:      PPO: Yes     PCP:       IPA:       80/20:       OTHER: Group [id number] PRIMARY: UHC Medicare Advantage      Policy#: [id number]      Subscriber: [place] Name:  Geryl Rankins      Phone#: [phone]     Fax#:   Pre-Cert#: [id number]      Employer: Retired Benefits:  Phone #: [phone]     Name: Andres Labrum. 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 right-handed female with history of coronary artery disease, hypertension, type 2 diabetes mellitus and remote tobacco abuse and breast cancer last fall which she underwent lumpectomy followed by radiation therapy. She did undergo adjunct chemotherapy.. Admitted 09/03/2012 with shortness of breath on exertion and tightness in her chest. Cardiac catheterization showed 70% stenosis of the left main coronary artery with severe three-vessel coronary artery disease with preserved left ventricular function. Underwent coronary artery bypass grafting x4 09/05/2012 per Dr. Roxy Manns. Postoperative day 2 with noted right-sided weakness. CT scan imaging showed left medial frontal parietal infarct felt to be embolic secondary to CABG. Neurology services consulted maintained on aspirin therapy as well as the addition of subcutaneous Lovenox  for DVT prophylaxis. Patient did not receive TPA.   Past Medical History  Past Medical History  Diagnosis Date  . Breast mass, right   . Vertigo   . Thyroid disease   . History of breast cancer     39 treatments of radiation. Negative chemo.  . Diabetes   . GERD (gastroesophageal reflux disease)   . Hyperlipemia   . History of seasonal allergies   . Arthritis   . Angina pectoris 08/26/2012  . Complication of anesthesia   . PONV (postoperative nausea and vomiting)   . Hypertension     sees Dr. Fulton Reek  . Coronary artery disease 08/22/2012    sees Dr Rockey Situ  . Pneumonia     hx of  . Headache     migraines  . Cancer     breast cancer, right side  . Gastritis     hx of  . S/P CABG x 4 09/05/2012    LIMA to LAD, SVG to Diag, SVG to OM1, SVG to PDA, EVH from bilateral thighs    Family History  family history includes Cancer in her father; Heart disease in her brother and mother; Hyperlipidemia in her brother; and Hypertension in her brother.  Prior Rehab/Hospitalizations:  No previous rehab   Current  Medications  Current facility-administered medications:0.9 %  sodium chloride infusion, 250 mL, Intravenous, PRN, Rexene Alberts, MD;  0.9 %  sodium chloride infusion, 250 mL, Intravenous, PRN, Erin Barrett, PA;  acetaminophen (TYLENOL) tablet 1,000 mg, 1,000 mg, Oral, Q6H, Rexene Alberts, MD, 1,000 mg at 09/10/12 [id number];  aspirin EC tablet 325 mg, 325 mg, Oral, Daily, Rexene Alberts, MD, 325 mg at 09/09/12 [id number] bisacodyl (DULCOLAX) EC tablet 10 mg, 10 mg, Oral, Daily, Rexene Alberts, MD, 10 mg at 09/09/12 [id number];  bisacodyl (DULCOLAX) suppository 10 mg, 10 mg, Rectal, Daily, Rexene Alberts, MD;  cloNIDine (CATAPRES - Dosed in mg/24 hr) patch 0.2 mg, 0.2 mg, Transdermal, Weekly, Rexene Alberts, MD, 0.2 mg at 09/06/12 1021;  docusate sodium (COLACE) capsule 200 mg, 200 mg, Oral, Daily, Rexene Alberts, MD, 200 mg at 09/09/12 0904 enoxaparin (LOVENOX) injection 30 mg, 30 mg,  Subcutaneous, Q24H, Grace Isaac, MD, 30 mg at 09/09/12 2206;  furosemide (LASIX) tablet 40 mg, 40 mg, Oral, BID, Rexene Alberts, MD, 40 mg at 09/09/12 1756;  insulin aspart (novoLOG) injection 0-24 Units, 0-24 Units, Subcutaneous, TID AC & HS, Rexene Alberts, MD, 2 Units at 09/10/12 0622 insulin glargine (LANTUS) injection 24 Units, 24 Units, Subcutaneous, BID, Grace Isaac, MD, 24 Units at 09/09/12 2207;  levothyroxine (SYNTHROID, LEVOTHROID) tablet 125 mcg, 125 mcg, Oral, Q breakfast, Rexene Alberts, MD, 125 mcg at 09/09/12 [id number];  lip balm (BLISTEX) ointment, , Topical, PRN, Rexene Alberts, MD;  metoprolol (LOPRESSOR) tablet 50 mg, 50 mg, Oral, BID, Rexene Alberts, MD [COMPLETED] moving right along book, , Does not apply, Once, Erin Barrett, PA;  ondansetron (ZOFRAN) injection 4 mg, 4 mg, Intravenous, Q6H PRN, Rexene Alberts, MD, 4 mg at 09/09/12 1230;  oxyCODONE (Oxy IR/ROXICODONE) immediate release tablet 5-10 mg, 5-10 mg, Oral, Q3H PRN, Rexene Alberts, MD, 10 mg at 09/08/12 1000;  pantoprazole (PROTONIX) EC tablet 40 mg, 40 mg, Oral, Daily, Rexene Alberts, MD, 40 mg at 09/09/12 0904 [COMPLETED] potassium chloride 10 mEq in 50 mL *CENTRAL LINE* IVPB, 10 mEq, Intravenous, Q1 Hr x 3, Rexene Alberts, MD, 10 mEq at 09/09/12 1000;  potassium chloride SA (K-DUR,KLOR-CON) CR tablet 20 mEq, 20 mEq, Oral, BID, Rexene Alberts, MD;  sodium chloride 0.9 % injection 10-40 mL, 10-40 mL, Intracatheter, PRN, Rexene Alberts, MD, 10 mL at 09/10/12 0343 sodium chloride 0.9 % injection 3 mL, 3 mL, Intravenous, Q12H, Rexene Alberts, MD, 3 mL at 09/09/12 2207;  sodium chloride 0.9 % injection 3 mL, 3 mL, Intravenous, PRN, Rexene Alberts, MD;  sodium chloride 0.9 % injection 3 mL, 3 mL, Intravenous, Q12H, Erin Barrett, PA;  sodium chloride 0.9 % injection 3 mL, 3 mL, Intravenous, PRN, Erin Barrett, PA;  traMADol (ULTRAM) tablet 50-100 mg, 50-100 mg, Oral, Q4H PRN, Rexene Alberts, MD [DISCONTINUED]  metoprolol tartrate (LOPRESSOR) tablet 25 mg, 25 mg, Oral, BID, Rexene Alberts, MD, 25 mg at 09/09/12 2206  Patients Current Diet: Carb Control  Precautions / Restrictions Precautions Precautions: Sternal;Fall Precaution Comments: Pt with chronic nausea per RN, presented with decreased verbilizations, became cold/clamy, and pale upon sitting EOB after 3-66min, returned to starting point once in supine. Restrictions Weight Bearing Restrictions: No   Prior Activity Level Community (5-7x/wk): Went out daily.  Home Assistive Devices / Equipment Home Assistive Devices/Equipment: Eyeglasses;Dentures (specify type) (upper dentures, lower partial) Home Adaptive Equipment: None  Prior Functional Level Prior Function Level  of Independence: Independent Able to Take Stairs?: Yes Driving: Yes Vocation: Retired Comments: Per Therapist, sports, pt did all house hold chores prior to admission  Current Functional Level Cognition  Arousal/Alertness: Awake/alert Overall Cognitive Status: Appears within functional limits for tasks assessed/performed Orientation Level: Oriented X4 Cognition - Other Comments: When pt. moved to sitting, responses became delayed    Extremity Assessment (includes Sensation/Coordination)  RUE ROM/Strength/Tone: Deficits RUE ROM/Strength/Tone Deficits: shoulder 3-/5; elbow distally 3/5 - 3+/5 RUE Sensation: WFL - Light Touch RUE Coordination: Deficits RUE Coordination Deficits: due to weakness  RLE ROM/Strength/Tone: Deficits RLE Sensation: Deficits RLE Sensation Deficits: Decr sensation right foot, improved sensory to pressure    ADLs  Eating/Feeding: Moderate assistance (with Rt. UE ) Where Assessed - Eating/Feeding: Bed level Grooming: Wash/dry hands;Wash/dry face;Teeth care;Brushing hair;Moderate assistance (with Rt. UE) Where Assessed - Grooming: Supine, head of bed up Upper Body Bathing: Moderate assistance Where Assessed - Upper Body Bathing: Supine, head of bed  up Lower Body Bathing: Moderate assistance Where Assessed - Lower Body Bathing: Supine, head of bed up;Rolling right and/or left Upper Body Dressing: Maximal assistance Where Assessed - Upper Body Dressing: Supine, head of bed up;Supported sitting Lower Body Dressing: +1 Total assistance Where Assessed - Lower Body Dressing: Supine, head of bed up;Rolling right and/or left Toilet Transfer: +1 Total assistance (unable to safely perform) Toileting - Clothing Manipulation and Hygiene: +1 Total assistance Where Assessed - Toileting Clothing Manipulation and Hygiene: Rolling right and/or left Transfers/Ambulation Related to ADLs: Unable to attempt sit to stand due to pt. became nauseated, increased HR, and diaphoretic EOB.  Pt. also requiring max A to maintain EOB sitting ADL Comments: Pt. with weakness Rt. UE, which is dominant UE.  Needs assist for use of Rt. UE.  Unable to attempt grooming EOB due to poor tolerance for EOB sitting, and pt. requires max A    Mobility  Bed Mobility: Rolling Right;Right Sidelying to Sit;Sitting - Scoot to [employer] of Bed;Sit to Supine;Scooting to [facility] Rolling Right: 2: Max assist Right Sidelying to Sit: 1: +2 Total assist;HOB elevated Right Sidelying to Sit: Patient Percentage: 10% Sitting - Scoot to Edge of Bed: 2: Max assist Sit to Supine: 1: +2 Total assist Sit to Supine: Patient Percentage: 20% Scooting to HOB: 1: +2 Total assist Scooting to [place]: Patient Percentage: 0%    Transfers  Transfers: Not assessed    Ambulation / Gait / Stairs / Wheelchair Mobility  Ambulation/Gait Ambulation/Gait Assistance: Not tested (comment) Stairs: No Wheelchair Mobility Wheelchair Mobility: No    Posture / Chiropodist Sitting - Balance Support: Bilateral upper extremity supported;Feet supported Static Sitting - Level of Assistance: 1: +1 Total assist Static Sitting - Comment/# of Minutes: 5 min sitting EOB, pt leaned heavily to right, unable to  center self.  Pt's HR increased and she was in chronic tachycardia increased to 130s, became cold, clamy, nauseated, palor, c/o increased wob, and stopped talking with slowed response to questions.  Return to supine, pt's HR returned to 120 (starting HR), she began talking quickly and fluently, color returned to face.  RN called to the room at time of decline; RN stated this is not abnormal for this pt.      Special needs/care consideration BiPAP/CPAP No CPM No Continuous Drip IV No Dialysis No        Days No Life Vest No Oxygen No Special Bed No Trach Size No Wound Vac (area) No      Location NO Skin Healing  incisions mid chest and thigh areas post CABG                          Bowel mgmt: No BM since 09/04/12 Bladder mgmt: Voiding on BSC Diabetic mgmt Yes.  Was on orals at home.  On insulin in acute care setting.     Previous Home Environment Living Arrangements: Spouse/significant other Lives With: Spouse Available Help at Discharge: Family;Friend(s);Available 24 hours/day Type of Home: House Home Layout: One level Home Access: Stairs to enter Entrance Stairs-Rails: None Entrance Stairs-Number of Steps: 3 Bathroom Shower/Tub: Chiropodist: Standard Home Care Services: No Additional Comments: Husband reports he will provide 24 hour care, but when asked about physical assist, he reports he wouldn't be able to lift her and states he will call the neighbors or 911  Discharge Living Setting Plans for Discharge Living Setting: Patient's home;House;Lives with (comment) (Lives with husband.) Type of Home at Discharge: House Discharge Home Layout: One level Discharge Home Access: Stairs to enter Entrance Stairs-Number of Steps: 1 step front and 3 steps back entry. Do you have any problems obtaining your medications?: No  Social/Family/Support Systems Patient Roles: Spouse Contact Information: Alvy Beal - spouse Anticipated Caregiver: Husband Anticipated  Caregiver's Contact Information: Bud - (h) [phone] (c) [phone] Ability/Limitations of Caregiver: Husband can assist but no heavy physical assist. Caregiver Availability: 24/7 Discharge Plan Discussed with Primary Caregiver: Yes Is Caregiver In Agreement with Plan?: Yes Does Caregiver/Family have Issues with Lodging/Transportation while Pt is in Rehab?: No    Goals/Additional Needs Patient/Family Goal for Rehab: PT/OT mod I/S, no ST needs identified Expected length of stay: 7-10 days Cultural Considerations: [employer] Dietary Needs: Carb Mod Med calorie diabetic diet with thin liquids. Equipment Needs: TBD Pt/Family Agrees to Admission and willing to participate: Yes Program Orientation Provided & Reviewed with Pt/Caregiver Including Roles  & Responsibilities: Yes   Decrease burden of Care through IP rehab admission:   Not applicable  Possible need for SNF placement upon discharge: Yes.  If husband cannot manage with functional gains at the end of rehab stay, may require SNF.  Patient Condition: This patient's condition remains as documented in the Consult dated 09/10/12, in which the Rehabilitation Physician determined and documented that the patient's condition is appropriate for intensive rehabilitative care in an inpatient rehabilitation facility.  Preadmission Screen Completed By:  Retta Diones, 09/10/2012 10:38 AM ______________________________________________________________________   Discussed status with Dr. Naaman Plummer on 09/10/12 at 1054 and received telephone approval for admission today.  Admission Coordinator:  Retta Diones, time1219/Date12/10/13

## 2012-09-10 NOTE — Interval H&P Note (Signed)
[name] was admitted today to Inpatient Rehabilitation with the diagnosis of embolic left medial frontal-parietal infarct.  The patient's history has been reviewed, patient examined, and there is no change in status.  Patient continues to be appropriate for intensive inpatient rehabilitation.  I have reviewed the patient's chart and labs.  Questions were answered to the patient's satisfaction.  Daniele Yankowski T 09/10/2012, 7:30 PM

## 2012-09-10 NOTE — Progress Notes (Signed)
Stroke Team Progress Note  HISTORY [name] is an 78 y.o. female who underwent CABG on 09/05/12. On today was able to ambulate with assistance this morning. Was checked by nursing at 0815 and was noted at that time to be leaning to the right and have right sided weakness. Consult was called at that time. Patient has pacing wires in place and will not bea ble to have a MRI. Is allergic to contrast dye.  LSN: [id number]  tPA Given: No: Recent CABG  SUBJECTIVE Patient lying in bed, holding heart pillow.  OBJECTIVE Most recent Vital Signs: Filed Vitals:   09/09/12 1800 09/09/12 1814 09/09/12 1942 09/10/12 0314  BP:  144/89 138/62 141/69  Pulse: 105 114 112 101  Temp:  97.3 F (36.3 C) 97.8 F (36.6 C) 97.4 F (36.3 C)  TempSrc:  Oral Oral Oral  Resp: 21 20 20 18   Height:      Weight:    97.433 kg (214 lb 12.8 oz)  SpO2: 96% 97% 95% 94%   CBG (last 3)   Basename 09/10/12 0606 09/09/12 2136 09/09/12 1725  GLUCAP 139* 115* 112*   IV Fluid Intake:     . [DISCONTINUED] sodium chloride 20 mL/hr at 09/06/12 2309  . [DISCONTINUED] sodium chloride    . [DISCONTINUED] nitroGLYCERIN 10 mcg/min (09/06/12 1300)   MEDICATIONS    . acetaminophen  1,000 mg Oral Q6H  . aspirin EC  325 mg Oral Daily  . bisacodyl  10 mg Oral Daily   Or  . bisacodyl  10 mg Rectal Daily  . cloNIDine  0.2 mg Transdermal Weekly  . docusate sodium  200 mg Oral Daily  . enoxaparin  30 mg Subcutaneous Q24H  . furosemide  40 mg Oral BID  . insulin aspart  0-24 Units Subcutaneous TID AC & HS  . insulin glargine  24 Units Subcutaneous BID  . levothyroxine  125 mcg Oral Q breakfast  . metoprolol tartrate  25 mg Oral BID  . [COMPLETED] moving right along book   Does not apply Once  . [COMPLETED] moving right along book   Does not apply Once  . pantoprazole  40 mg Oral Daily  . [COMPLETED] potassium chloride  10 mEq Intravenous Q1 Hr x 3  . potassium chloride  20 mEq Oral BID  . sodium chloride  3 mL Intravenous  Q12H  . sodium chloride  3 mL Intravenous Q12H  . [DISCONTINUED] insulin aspart  0-24 Units Subcutaneous Q4H  . [DISCONTINUED] metoprolol tartrate  25 mg Oral BID  . [DISCONTINUED] potassium chloride  20 mEq Oral Daily  . [DISCONTINUED] sodium chloride  3 mL Intravenous Q12H   PRN:  sodium chloride, sodium chloride, lip balm, ondansetron (ZOFRAN) IV, oxyCODONE, sodium chloride, sodium chloride, sodium chloride, traMADol, [DISCONTINUED] metoprolol, [DISCONTINUED]  morphine injection, [DISCONTINUED] promethazine, [DISCONTINUED] sodium chloride  Diet:  Carb Control   Activity:  OOB, begin ambulation  DVT Prophylaxis:  lovenox  CLINICALLY SIGNIFICANT STUDIES Basic Metabolic Panel:  Lab 0000000 0348 09/09/12 0344 09/06/12 1635 09/06/12 0345  NA 135 134* -- --  K 3.7 3.5 -- --  CL 98 97 -- --  CO2 31 32 -- --  GLUCOSE 127* 103* -- --  BUN 15 13 -- --  CREATININE 0.72 0.61 -- --  CALCIUM 8.6 8.3* -- --  MG -- -- 1.8 2.0  PHOS -- -- -- --   Liver Function Tests:  Lab 09/03/12 1355  AST 17  ALT 11  ALKPHOS 126*  BILITOT 0.3  PROT 8.6*  ALBUMIN 3.4*   CBC:  Lab 09/10/12 0348 09/09/12 0344  WBC 9.5 8.9  NEUTROABS -- --  HGB 11.2* 10.7*  HCT 33.0* 31.7*  MCV 86.8 86.6  PLT 289 221   Coagulation:  Lab 09/05/12 1339 09/03/12 1355  LABPROT 16.1* 12.8  INR 1.32 0.97   Cardiac Enzymes: No results found for this basename: CKTOTAL:3,CKMB:3,CKMBINDEX:3,TROPONINI:3 in the last 168 hours Urinalysis:   Lab 09/03/12 1355  COLORURINE YELLOW  LABSPEC 1.020  PHURINE 5.0  GLUCOSEU NEGATIVE  HGBUR NEGATIVE  BILIRUBINUR NEGATIVE  KETONESUR NEGATIVE  PROTEINUR NEGATIVE  UROBILINOGEN 0.2  NITRITE NEGATIVE  LEUKOCYTESUR SMALL*   Lipid Panel     Component Value Date/Time   CHOL 154 09/09/2012 0344   TRIG 157* 09/09/2012 0344   HDL 31* 09/09/2012 0344   CHOLHDL 5.0 09/09/2012 0344   VLDL 31 09/09/2012 0344   LDLCALC 92 09/09/2012 0344   HgbA1C  Lab Results  Component Value Date    HGBA1C 6.8* 09/03/2012   Urine Drug Screen:   No results found for this basename: labopia,  cocainscrnur,  labbenz,  amphetmu,  thcu,  labbarb    Alcohol Level: No results found for this basename: ETH:2 in the last 168 hours  CT head   09/07/2012 Mild cortical atrophy. No acute abnormality. 09/08/2012 1. Subacute left frontoparietal infarct, medially. 2. Stable atrophy, mild chronic small vessel white matter ischemic changes and old right frontal lobe white matter lacunar infarct.  MRI of the brain  Unable due to pacer wire  MRA of the brain  Unable due to pacer wire  Transesophageal Echocardiogram  intraop 09/05/12 EF 50-55% . No clot  Carotid Doppler  09/03/12 no stenosis  CXR  09/08/2012 Stable chest radiograph with mild right basilar atelectasis and small right pleural effusion. No evidence of pneumothorax.  EKG  Normal sinus rhythm. Nonspecific T wave abnormality  Therapy Recommendations  SNF  Physical Exam  Pleasant elderly caucasian lady not in distress.Rt IJ central line and midline postCABG dressing.Awake alert. Afebrile. Head is nontraumatic. Neck is supple without bruit. Hearing is normal. Cardiac exam no murmur or gallop. Lungs are clear to auscultation. Distal pulses are well felt.  Neurological Exam ; Awake alert oriented x 3 normal speech and language. Mild right lower face asymmetry. Tongue midline. Rt UE and LE drift.RUE 4/5 and RLE  3-4/5 strength. LUE normal and LLE 4/5 strength.  Normal sensation . Normal coordination. Plantars upgoing on rt and downgoing on left.  ASSESSMENT [name] is a 78 y.o. female presenting with Rt hemiparesis 2 days post CABG. Imaging confirms a Left medial frontopareital infarct. Infarct felt to be  embolic secondary to CABG.  On aspirin 325 mg orally every day prior to admission. Now on aspirin 325 mg orally every day for secondary stroke prevention. Patient with resultant  Rt hemiplegia. Felt to be a good inpatient rehab  candidate.   Diabetes, HgbA1c 6.8  Hyperlipidemia, LDL 92, not on statin PTA, at goal LDL < 100  Hypertension  CAD s/p CABG   Migraines  Hx breast cancer  Hospital day # 5  TREATMENT/PLAN  Continue aspirin 325 mg orally every day for secondary stroke prevention.  Reorder TCD to image intracranial vessels. Dr. Leonie Man will followup at discharge.  Agree with recommendations for inpatient rehab Stroke Service will sign off. Follow up with Dr. Leonie Man, [facility], in 2 months.  SHARON BIBY, MSN, RN, ANVP-BC, ANP-BC, GNP-BC [facility] Pager:  [phone] 09/10/2012 8:17 AM  i have personally examined this patient, reviewed data and agree with above plan  Antony Contras, MD Medical Director [facility] Pager: [phone] 09/10/2012 12:01 PM

## 2012-09-10 NOTE — H&P (View-Only) (Signed)
Physical Medicine and Rehabilitation Admission H&P    CC:  Right sided weakness after CABG  HPI: [name] is a 78 y.o. right-handed female with history of coronary artery disease, hypertension, type 2 diabetes mellitus and remote tobacco abuse.  Admitted 09/03/2012 with shortness of breath on exertion and tightness in her chest. Cardiac catheterization showed 70% stenosis of the left main coronary artery with severe three-vessel coronary artery disease with preserved left ventricular function. She underwent coronary artery bypass grafting x4 09/05/2012 by Dr. Roxy Manns. Postoperative day 2 with noted right-sided weakness. CT scan imaging showed subacute left medial frontal parietal infarct felt to be embolic secondary to CABG and old right frontal lobe lacunar infarct. Neurology consulted and recommended continuing aspirin therapy as well as the addition of subcutaneous Lovenox for DVT prophylaxis.  Pre op carotid dopplers without ICA stenosis. Physical and occupational therapy evaluations are pending. M.D. is requested physical medicine rehabilitation consult to consider inpatient rehabilitation services     Review of Systems  Constitutional:       Anorexia  HENT: Negative for hearing loss and neck pain.   Eyes: Negative for blurred vision and double vision.  Respiratory: Negative for cough and shortness of breath.   Cardiovascular: Negative for chest pain and palpitations.  Gastrointestinal: Positive for nausea and constipation. Negative for vomiting and abdominal pain.  Genitourinary: Positive for urgency and frequency.  Musculoskeletal: Negative for back pain and joint pain.  Neurological: Positive for focal weakness. Negative for dizziness and headaches.   Past Medical History  Diagnosis Date  . Breast mass, right   . Vertigo   . Thyroid disease   . History of breast cancer     39 treatments of radiation. Negative chemo.  . Diabetes   . GERD (gastroesophageal reflux disease)   .  Hyperlipemia   . History of seasonal allergies   . Arthritis   . Angina pectoris 08/26/2012  . Complication of anesthesia   . PONV (postoperative nausea and vomiting)   . Hypertension     sees Dr. Fulton Reek  . Coronary artery disease 08/22/2012    sees Dr Rockey Situ  . Pneumonia     hx of  . Headache     migraines  . Cancer     breast cancer, right side  . Gastritis     hx of  . S/P CABG x 4 09/05/2012    LIMA to LAD, SVG to Diag, SVG to OM1, SVG to PDA, EVH from bilateral thighs   Past Surgical History  Procedure Date  . Abdominal hysterectomy   . Ovarian cyst removal   . Back surgery   . Appendectomy   . Breast surgery   . Laparoscopic nissen fundoplication   . Coronary artery bypass graft 09/05/2012    Procedure: CORONARY ARTERY BYPASS GRAFTING (CABG);  Surgeon: Rexene Alberts, MD;  Location: [place];  Service: Open Heart Surgery;  Laterality: N/A;  CABG x four, using left internal mammary artery and bilateral greater saphenous vein harvested endoscopically  . Intraoperative transesophageal echocardiogram 09/05/2012    Procedure: INTRAOPERATIVE TRANSESOPHAGEAL ECHOCARDIOGRAM;  Surgeon: Rexene Alberts, MD;  Location: [place];  Service: Open Heart Surgery;  Laterality: N/A;   Family History  Problem Relation Age of Onset  . Heart disease Brother     CABG & stents  . Hyperlipidemia Brother   . Hypertension Brother   . Cancer Father   . Heart disease Mother    Social History:  Married. Independent and active  PTA. She reports that she quit smoking about 23 years ago. Her smoking use included Cigarettes. She does not have any smokeless tobacco history on file. She reports that she does not drink alcohol or use illicit drugs. Allergies:  Allergies  Allergen Reactions  . Ciprocinonide (Fluocinolone)     Feels crazy  . Darvocet (Propoxyphene-Acetaminophen)     Difficulty breathing and swallowing  . Ivp Dye (Iodinated Diagnostic Agents)     Nausea & vomiting  . Other      Reports all cholesterol medication  . Statins     Muscle aches   Medications Prior to Admission  Medication Sig Dispense Refill  . levothyroxine (SYNTHROID, LEVOTHROID) 125 MCG tablet Take 125 mcg by mouth daily.      . [DISCONTINUED] cloNIDine (CATAPRES) 0.2 MG tablet Take 0.2 mg by mouth 2 (two) times daily.      . [DISCONTINUED] glimepiride (AMARYL) 2 MG tablet Take 2 mg by mouth daily before breakfast.      . [DISCONTINUED] ibuprofen (ADVIL,MOTRIN) 200 MG tablet Take 800 mg by mouth every 6 (six) hours as needed. For pain      . [DISCONTINUED] letrozole (FEMARA) 2.5 MG tablet Take 2.5 mg by mouth daily.      . [DISCONTINUED] metoprolol tartrate (LOPRESSOR) 25 MG tablet Take 25 mg by mouth 2 (two) times daily.      Marland Kitchen aspirin 325 MG EC tablet Take 325 mg by mouth daily.      . [DISCONTINUED] nitroGLYCERIN (NITROSTAT) 0.4 MG SL tablet Place 0.4 mg under the tongue every 5 (five) minutes as needed. For chest pains        Home: Home Living Lives With: Spouse Available Help at Discharge: Family;Friend(s);Available 24 hours/day Type of Home: House Home Access: Stairs to enter [employer] of Steps: 3 Entrance Stairs-Rails: None Home Layout: One level Bathroom Shower/Tub: Chiropodist: Standard Home Adaptive Equipment: None Additional Comments: Husband reports he will provide 24 hour care, but when asked about physical assist, he reports he wouldn't be able to lift her and states he will call the neighbors or 911   Functional History: Prior Function Able to Take Stairs?: Yes Driving: Yes Vocation: Retired Comments: Per Therapist, sports, pt did all house hold chores prior to admission  Functional Status:  Mobility: Bed Mobility Bed Mobility: Rolling Right;Right Sidelying to Sit;Sitting - Scoot to [employer] of Bed;Sit to Supine;Scooting to [facility] Rolling Right: 2: Max assist Right Sidelying to Sit: 1: +2 Total assist;HOB elevated Right Sidelying to Sit: Patient Percentage:  10% Sitting - Scoot to Edge of Bed: 2: Max assist Sit to Supine: 1: +2 Total assist Sit to Supine: Patient Percentage: 20% Scooting to HOB: 1: +2 Total assist Scooting to [facility]: Patient Percentage: 0% Transfers Transfers: Not assessed Ambulation/Gait Ambulation/Gait Assistance: Not tested (comment) Stairs: No Wheelchair Mobility Wheelchair Mobility: No  ADL: ADL Eating/Feeding: Moderate assistance (with Rt. UE ) Where Assessed - Eating/Feeding: Bed level Grooming: Wash/dry hands;Wash/dry face;Teeth care;Brushing hair;Moderate assistance (with Rt. UE) Where Assessed - Grooming: Supine, head of bed up Upper Body Bathing: Moderate assistance Where Assessed - Upper Body Bathing: Supine, head of bed up Lower Body Bathing: Moderate assistance Where Assessed - Lower Body Bathing: Supine, head of bed up;Rolling right and/or left Upper Body Dressing: Maximal assistance Where Assessed - Upper Body Dressing: Supine, head of bed up;Supported sitting Lower Body Dressing: +1 Total assistance Where Assessed - Lower Body Dressing: Supine, head of bed up;Rolling right and/or left Toilet Transfer: +1 Total  assistance (unable to safely perform) Transfers/Ambulation Related to ADLs: Unable to attempt sit to stand due to pt. became nauseated, increased HR, and diaphoretic EOB.  Pt. also requiring max A to maintain EOB sitting ADL Comments: Pt. with weakness Rt. UE, which is dominant UE.  Needs assist for use of Rt. UE.  Unable to attempt grooming EOB due to poor tolerance for EOB sitting, and pt. requires max A  Cognition: Cognition Arousal/Alertness: Awake/alert Orientation Level: Oriented X4 Cognition Overall Cognitive Status: Appears within functional limits for tasks assessed/performed Arousal/Alertness: Awake/alert Orientation Level: Oriented X4 / Intact Behavior During Session: [facility] for tasks performed Cognition - Other Comments: When pt. moved to sitting, responses became delayed   Blood  pressure 141/69, pulse 101, temperature 97.4 F (36.3 C), temperature source Oral, resp. rate 18, height 5\' 5"  (1.651 m), weight 97.433 kg (214 lb 12.8 oz), SpO2 94.00%.  Physical Exam  Nursing note and vitals reviewed. Constitutional: She is oriented to person, place, and time. She appears well-developed and well-nourished.  HENT:  Head: Normocephalic and atraumatic.       Wears upper and partial lower dentures.  Eyes: Pupils are equal, round, and reactive to light.  Neck: Normal range of motion. Neck supple.  Cardiovascular: Normal rate and regular rhythm.   Pulmonary/Chest: Effort normal and breath sounds normal. She has no wheezes. She has no rales.  Abdominal: Soft. Bowel sounds are normal.  Neurological: She is alert and oriented to person, place, and time.       Speech clear. Follows commands without difficulty. Flat affect. Right sided weakness UE>LE. RUE is 4/5. RLE 4/5. No gross sensory deficits. Cognitively she displays good insight and awareness.  Skin: Skin is warm and dry.       Chest wall and R-shin incision clean and dry without erythema. Well-approximated    Results for orders placed during the hospital encounter of 09/05/12 (from the past 48 hour(s))  GLUCOSE, CAPILLARY     Status: Abnormal   Collection Time   09/08/12 12:05 PM      Component Value Range Comment   Glucose-Capillary 124 (*) 70 - 99 mg/dL    Comment 1 Documented in Chart      Comment 2 Notify RN     GLUCOSE, CAPILLARY     Status: Abnormal   Collection Time   09/08/12  3:59 PM      Component Value Range Comment   Glucose-Capillary 171 (*) 70 - 99 mg/dL   GLUCOSE, CAPILLARY     Status: Abnormal   Collection Time   09/08/12  7:25 PM      Component Value Range Comment   Glucose-Capillary 282 (*) 70 - 99 mg/dL    Comment 1 Notify RN      Comment 2 Documented in Chart     GLUCOSE, CAPILLARY     Status: Normal   Collection Time   09/09/12 12:07 AM      Component Value Range Comment   Glucose-Capillary  95  70 - 99 mg/dL    Comment 1 Notify RN      Comment 2 Documented in Chart     GLUCOSE, CAPILLARY     Status: Normal   Collection Time   09/09/12  3:12 AM      Component Value Range Comment   Glucose-Capillary 84  70 - 99 mg/dL    Comment 1 Notify RN      Comment 2 Documented in Chart     BASIC METABOLIC PANEL  Status: Abnormal   Collection Time   09/09/12  3:44 AM      Component Value Range Comment   Sodium 134 (*) 135 - 145 mEq/L    Potassium 3.5  3.5 - 5.1 mEq/L    Chloride 97  96 - 112 mEq/L    CO2 32  19 - 32 mEq/L    Glucose, Bld 103 (*) 70 - 99 mg/dL    BUN 13  6 - 23 mg/dL    Creatinine, Ser 0.61  0.50 - 1.10 mg/dL    Calcium 8.3 (*) 8.4 - 10.5 mg/dL    GFR calc non Af Amer 86 (*) >90 mL/min    GFR calc Af Amer >90  >90 mL/min   CBC     Status: Abnormal   Collection Time   09/09/12  3:44 AM      Component Value Range Comment   WBC 8.9  4.0 - 10.5 K/uL    RBC 3.66 (*) 3.87 - 5.11 MIL/uL    Hemoglobin 10.7 (*) 12.0 - 15.0 g/dL    HCT 31.7 (*) 36.0 - 46.0 %    MCV 86.6  78.0 - 100.0 fL    MCH 29.2  26.0 - 34.0 pg    MCHC 33.8  30.0 - 36.0 g/dL    RDW 14.3  11.5 - 15.5 %    Platelets 221  150 - 400 K/uL   LIPID PANEL     Status: Abnormal   Collection Time   09/09/12  3:44 AM      Component Value Range Comment   Cholesterol 154  0 - 200 mg/dL    Triglycerides 157 (*) <150 mg/dL    HDL 31 (*) >39 mg/dL    Total CHOL/HDL Ratio 5.0      VLDL 31  0 - 40 mg/dL    LDL Cholesterol 92  0 - 99 mg/dL   GLUCOSE, CAPILLARY     Status: Abnormal   Collection Time   09/09/12  7:30 AM      Component Value Range Comment   Glucose-Capillary 103 (*) 70 - 99 mg/dL   GLUCOSE, CAPILLARY     Status: Abnormal   Collection Time   09/09/12 11:14 AM      Component Value Range Comment   Glucose-Capillary 151 (*) 70 - 99 mg/dL   GLUCOSE, CAPILLARY     Status: Abnormal   Collection Time   09/09/12  5:25 PM      Component Value Range Comment   Glucose-Capillary 112 (*) 70 - 99 mg/dL    GLUCOSE, CAPILLARY     Status: Abnormal   Collection Time   09/09/12  9:36 PM      Component Value Range Comment   Glucose-Capillary 115 (*) 70 - 99 mg/dL    Comment 1 Documented in Chart      Comment 2 Notify RN     CBC     Status: Abnormal   Collection Time   09/10/12  3:48 AM      Component Value Range Comment   WBC 9.5  4.0 - 10.5 K/uL    RBC 3.80 (*) 3.87 - 5.11 MIL/uL    Hemoglobin 11.2 (*) 12.0 - 15.0 g/dL    HCT 33.0 (*) 36.0 - 46.0 %    MCV 86.8  78.0 - 100.0 fL    MCH 29.5  26.0 - 34.0 pg    MCHC 33.9  30.0 - 36.0 g/dL    RDW 14.2  11.5 - 15.5 %    Platelets 289  150 - 400 K/uL DELTA CHECK NOTED  BASIC METABOLIC PANEL     Status: Abnormal   Collection Time   09/10/12  3:48 AM      Component Value Range Comment   Sodium 135  135 - 145 mEq/L    Potassium 3.7  3.5 - 5.1 mEq/L    Chloride 98  96 - 112 mEq/L    CO2 31  19 - 32 mEq/L    Glucose, Bld 127 (*) 70 - 99 mg/dL    BUN 15  6 - 23 mg/dL    Creatinine, Ser 0.72  0.50 - 1.10 mg/dL    Calcium 8.6  8.4 - 10.5 mg/dL    GFR calc non Af Amer 81 (*) >90 mL/min    GFR calc Af Amer >90  >90 mL/min   GLUCOSE, CAPILLARY     Status: Abnormal   Collection Time   09/10/12  6:06 AM      Component Value Range Comment   Glucose-Capillary 139 (*) 70 - 99 mg/dL    No results found.  Post Admission Physician Evaluation: 1. Functional deficits secondary  to left medial frontal parietal infarct felt to be embolic. 2. Patient is admitted to receive collaborative, interdisciplinary care between the physiatrist, rehab nursing staff, and therapy team. 3. Patient's level of medical complexity and substantial therapy needs in context of that medical necessity cannot be provided at a lesser intensity of care such as a SNF. 4. Patient has experienced substantial functional loss from his/her baseline which was documented above under the "Functional History" and "Functional Status" headings.  Judging by the patient's diagnosis, physical  exam, and functional history, the patient has potential for functional progress which will result in measurable gains while on inpatient rehab.  These gains will be of substantial and practical use upon discharge  in facilitating mobility and self-care at the household level. 5. Physiatrist will provide 24 hour management of medical needs as well as oversight of the therapy plan/treatment and provide guidance as appropriate regarding the interaction of the two. 6. 24 hour rehab nursing will assist with bladder management, bowel management, safety, skin/wound care, disease management, medication administration, pain management and patient education  and help integrate therapy concepts, techniques,education, etc. 7. PT will assess and treat for:  Lower extremity strength, range of motion, stamina, balance, functional mobility, safety, adaptive techniques and equipment, NMR.  Goals are: mod I to supervision. 8. OT will assess and treat for: ADL's, functional mobility, safety, upper extremity strength, adaptive techniques and equipment, NMR.   Goals are: mod I to minimal assist. 9. SLP will assess and treat for: n/a.  Goals are: n/a. 10. Case Management and Social Worker will assess and treat for psychological issues and discharge planning. 11. Team conference will be held weekly to assess progress toward goals and to determine barriers to discharge. 12. Patient will receive at least 3 hours of therapy per day at least 5 days per week. 13. ELOS: 10 days      Prognosis:  excellent   Medical Problem List and Plan: 1. DVT Prophylaxis/Anticoagulation: Pharmaceutical: Lovenox 2. Pain Management: oxycodone prn for pain. 3. Mood: Flat affect noted. Will monitor for now. LCSW to follow up  4. Neuropsych: This patient IS capable of making decisions on his/her own behalf. 5. HTN: Monitor with bid checks. Continue catapres, metoprolol and lasix.  6. Breast cancer: Femara on hold.  7. Fluid overload: Check daily  weights. Continue lasix bid.  8. DM type 2: Continue lantus 24 units bid.  CBG checks AC/HS with SSI for BS control to help with wound healing. Continue to hold amaryl. Anticipated upward trend as appetite improves.  9. Constipation: reports poor appetite with nausea. SSE today. Augment bowel program  Oval Linsey, MD 09/10/2012

## 2012-09-10 NOTE — Progress Notes (Signed)
Rehab admissions - I have approval for acute inpatient rehab admission from insurance carrier.  Bed available and can admit to inpatient rehab today.  Call me for questions.  [phone]

## 2012-09-10 NOTE — Care Management Note (Signed)
    Page 1 of 1   09/10/2012     3:31:04 PM   CARE MANAGEMENT NOTE 09/10/2012  Patient:  [name], [name]   Account Number:  [id number]  Date Initiated:  09/05/2012  Documentation initiated by:  Felix Ahmadi Assessment:   78 yr-old female adm with dx of CAD s/p CABG x 4     Action/Plan:   CVA 2 DAYS POST OP CABG; CIR CONSULT IN PROGRESS.   Anticipated DC Date:  09/10/2012   Anticipated DC Plan:  IP REHAB FACILITY      DC Planning Services  CM consult      Choice offered to / List presented to:             Status of service:  Completed, signed off Medicare Important Message given?   (If response is "NO", the following Medicare IM given date fields will be blank) Date Medicare IM given:   Date Additional Medicare IM given:    Discharge Disposition:  IP REHAB FACILITY  Per UR Regulation:  Reviewed for med. necessity/level of care/duration of stay  If discussed at [place] of Stay Meetings, dates discussed:    Comments:  PCP: Dr Fulton Reek with [facility]  09/10/12 Fischer Halley,RN,BSN [phone] PT MEDICALLY STABLE AND INSURANCE APPROVED FOR CIR.  WILL TRANSFER TO [facility] TODAY.

## 2012-09-10 NOTE — Discharge Summary (Signed)
I agree with the above discharge summary and plan for follow-up.  [name] H  

## 2012-09-10 NOTE — Progress Notes (Signed)
Physical Therapy Treatment Patient Details Name: [name] MRN: [id number] DOB: 1934/06/18 Today's Date: 09/10/2012 Time: 1132-     PT Assessment / Plan / Recommendation Comments on Treatment Session  Moving better today. Plans for CIR today.     Follow Up Recommendations  CIR     Does the patient have the potential to tolerate intense rehabilitation     Barriers to Discharge        Equipment Recommendations  Rolling walker with 5" wheels    Recommendations for Other Services    Frequency Min 4X/week   Plan Discharge plan remains appropriate;Frequency remains appropriate    Precautions / Restrictions Precautions Precautions: Fall;Sternal Restrictions Weight Bearing Restrictions: No       Mobility  Bed Mobility Bed Mobility: Sit to Sidelying Left;Rolling Right;Scooting to HOB Rolling Right: 4: Min assist Sit to Sidelying Left: 1: +2 Total assist Sit to Sidelying Left: Patient Percentage: 60% Scooting to HOB: 1: +2 Total assist Scooting to [facility]: Patient Percentage: 30% Details for Bed Mobility Assistance: sequencing cues especially for sternal precautions, assist to elevated legs to bed; cues for bridging to assist with scooting Transfers Transfers: Sit to Stand;Stand to Sit Sit to Stand: 1: +2 Total assist;Without upper extremity assist;From chair/3-in-1 Sit to Stand: Patient Percentage: 60% Stand to Sit: 3: Mod assist;Without upper extremity assist;To bed Details for Transfer Assistance: facilitation bilaterally to bring trunk anteriorly over BOS as well as faciliation to unweight hips and power up for tall standing, facilitation for midline and stability as she stood; facilitation to sit slowly without use of hands Ambulation/Gait Ambulation/Gait Assistance: 3: Mod assist Ambulation Distance (Feet): 10 Feet Assistive device: Rolling walker Ambulation/Gait Assistance Details: cues for tall and midline postuer as well as improved BOS and stability assist  during gait Gait Pattern: Wide base of support;Shuffle General Gait Details: downward gaze, staggered gait, decreased trunk control with lateral sway Stairs: No     PT Goals Acute Rehab PT Goals PT Goal: Rolling Supine to Right Side - Progress: Progressing toward goal PT Goal: Supine/Side to Sit - Progress: Progressing toward goal PT Goal: Sit at Edge Of Bed - Progress: Progressing toward goal PT Goal: Sit to Supine/Side - Progress: Progressing toward goal PT Transfer Goal: Bed to Chair/Chair to Bed - Progress: Progressing toward goal PT Goal: Stand - Progress: Progressing toward goal Pt will Ambulate: 1 - 15 feet;with min assist PT Goal: Ambulate - Progress: Updated due to goal met  Visit Information  Last PT Received On: 09/10/12 Assistance Needed: +2    Subjective Data  Subjective: I am still feeling weak.    Cognition  Overall Cognitive Status: Impaired Area of Impairment: Attention Arousal/Alertness: Awake/alert Orientation Level: Appears intact for tasks assessed Behavior During Session: [facility] for tasks performed Current Attention Level: Selective;Alternating Cognition - Other Comments: slow responses and processing, difficulty following multistep commands some today, overwhelmed with a lot of cueing    Balance  Static Sitting Balance Static Sitting - Balance Support: No upper extremity supported Static Sitting - Level of Assistance: 3: Mod assist;4: Min assist Static Sitting - Comment/# of Minutes: sitting balance practice for 2 mintues; pt in recliner and needing min-mod facilitation and cues for tall posture with midline orientation, pt with decreased postural awareness and control but able to sit tall and in the middle for 10 seconds at sba level with verbal cues for maintenance of posture, pt with increased sway  End of Session PT - End of Session Equipment Utilized During Treatment:  Gait belt Activity Tolerance: Patient tolerated treatment well Patient left: in  bed;with call bell/phone within reach Nurse Communication: Mobility status   GP     [place] 09/10/2012, 2:10 PM

## 2012-09-10 NOTE — H&P (Signed)
Physical Medicine and Rehabilitation Admission H&P    CC:  Right sided weakness after CABG  HPI: [name] is a 78 y.o. right-handed female with history of coronary artery disease, hypertension, type 2 diabetes mellitus and remote tobacco abuse.  Admitted 09/03/2012 with shortness of breath on exertion and tightness in her chest. Cardiac catheterization showed 70% stenosis of the left main coronary artery with severe three-vessel coronary artery disease with preserved left ventricular function. She underwent coronary artery bypass grafting x4 09/05/2012 by Dr. Roxy Manns. Postoperative day 2 with noted right-sided weakness. CT scan imaging showed subacute left medial frontal parietal infarct felt to be embolic secondary to CABG and old right frontal lobe lacunar infarct. Neurology consulted and recommended continuing aspirin therapy as well as the addition of subcutaneous Lovenox for DVT prophylaxis.  Pre op carotid dopplers without ICA stenosis. Physical and occupational therapy evaluations are pending. M.D. is requested physical medicine rehabilitation consult to consider inpatient rehabilitation services     Review of Systems  Constitutional:       Anorexia  HENT: Negative for hearing loss and neck pain.   Eyes: Negative for blurred vision and double vision.  Respiratory: Negative for cough and shortness of breath.   Cardiovascular: Negative for chest pain and palpitations.  Gastrointestinal: Positive for nausea and constipation. Negative for vomiting and abdominal pain.  Genitourinary: Positive for urgency and frequency.  Musculoskeletal: Negative for back pain and joint pain.  Neurological: Positive for focal weakness. Negative for dizziness and headaches.   Past Medical History  Diagnosis Date  . Breast mass, right   . Vertigo   . Thyroid disease   . History of breast cancer     39 treatments of radiation. Negative chemo.  . Diabetes   . GERD (gastroesophageal reflux disease)   .  Hyperlipemia   . History of seasonal allergies   . Arthritis   . Angina pectoris 08/26/2012  . Complication of anesthesia   . PONV (postoperative nausea and vomiting)   . Hypertension     sees Dr. Fulton Reek  . Coronary artery disease 08/22/2012    sees Dr Rockey Situ  . Pneumonia     hx of  . Headache     migraines  . Cancer     breast cancer, right side  . Gastritis     hx of  . S/P CABG x 4 09/05/2012    LIMA to LAD, SVG to Diag, SVG to OM1, SVG to PDA, EVH from bilateral thighs   Past Surgical History  Procedure Date  . Abdominal hysterectomy   . Ovarian cyst removal   . Back surgery   . Appendectomy   . Breast surgery   . Laparoscopic nissen fundoplication   . Coronary artery bypass graft 09/05/2012    Procedure: CORONARY ARTERY BYPASS GRAFTING (CABG);  Surgeon: Rexene Alberts, MD;  Location: [place];  Service: Open Heart Surgery;  Laterality: N/A;  CABG x four, using left internal mammary artery and bilateral greater saphenous vein harvested endoscopically  . Intraoperative transesophageal echocardiogram 09/05/2012    Procedure: INTRAOPERATIVE TRANSESOPHAGEAL ECHOCARDIOGRAM;  Surgeon: Rexene Alberts, MD;  Location: [place];  Service: Open Heart Surgery;  Laterality: N/A;   Family History  Problem Relation Age of Onset  . Heart disease Brother     CABG & stents  . Hyperlipidemia Brother   . Hypertension Brother   . Cancer Father   . Heart disease Mother    Social History:  Married. Independent and active  PTA. She reports that she quit smoking about 23 years ago. Her smoking use included Cigarettes. She does not have any smokeless tobacco history on file. She reports that she does not drink alcohol or use illicit drugs. Allergies:  Allergies  Allergen Reactions  . Ciprocinonide (Fluocinolone)     Feels crazy  . Darvocet (Propoxyphene-Acetaminophen)     Difficulty breathing and swallowing  . Ivp Dye (Iodinated Diagnostic Agents)     Nausea & vomiting  . Other      Reports all cholesterol medication  . Statins     Muscle aches   Medications Prior to Admission  Medication Sig Dispense Refill  . levothyroxine (SYNTHROID, LEVOTHROID) 125 MCG tablet Take 125 mcg by mouth daily.      . [DISCONTINUED] cloNIDine (CATAPRES) 0.2 MG tablet Take 0.2 mg by mouth 2 (two) times daily.      . [DISCONTINUED] glimepiride (AMARYL) 2 MG tablet Take 2 mg by mouth daily before breakfast.      . [DISCONTINUED] ibuprofen (ADVIL,MOTRIN) 200 MG tablet Take 800 mg by mouth every 6 (six) hours as needed. For pain      . [DISCONTINUED] letrozole (FEMARA) 2.5 MG tablet Take 2.5 mg by mouth daily.      . [DISCONTINUED] metoprolol tartrate (LOPRESSOR) 25 MG tablet Take 25 mg by mouth 2 (two) times daily.      Marland Kitchen aspirin 325 MG EC tablet Take 325 mg by mouth daily.      . [DISCONTINUED] nitroGLYCERIN (NITROSTAT) 0.4 MG SL tablet Place 0.4 mg under the tongue every 5 (five) minutes as needed. For chest pains        Home: Home Living Lives With: Spouse Available Help at Discharge: Family;Friend(s);Available 24 hours/day Type of Home: House Home Access: Stairs to enter [employer] of Steps: 3 Entrance Stairs-Rails: None Home Layout: One level Bathroom Shower/Tub: Chiropodist: Standard Home Adaptive Equipment: None Additional Comments: Husband reports he will provide 24 hour care, but when asked about physical assist, he reports he wouldn't be able to lift her and states he will call the neighbors or 911   Functional History: Prior Function Able to Take Stairs?: Yes Driving: Yes Vocation: Retired Comments: Per Therapist, sports, pt did all house hold chores prior to admission  Functional Status:  Mobility: Bed Mobility Bed Mobility: Rolling Right;Right Sidelying to Sit;Sitting - Scoot to [employer] of Bed;Sit to Supine;Scooting to [facility] Rolling Right: 2: Max assist Right Sidelying to Sit: 1: +2 Total assist;HOB elevated Right Sidelying to Sit: Patient Percentage:  10% Sitting - Scoot to Edge of Bed: 2: Max assist Sit to Supine: 1: +2 Total assist Sit to Supine: Patient Percentage: 20% Scooting to HOB: 1: +2 Total assist Scooting to [facility]: Patient Percentage: 0% Transfers Transfers: Not assessed Ambulation/Gait Ambulation/Gait Assistance: Not tested (comment) Stairs: No Wheelchair Mobility Wheelchair Mobility: No  ADL: ADL Eating/Feeding: Moderate assistance (with Rt. UE ) Where Assessed - Eating/Feeding: Bed level Grooming: Wash/dry hands;Wash/dry face;Teeth care;Brushing hair;Moderate assistance (with Rt. UE) Where Assessed - Grooming: Supine, head of bed up Upper Body Bathing: Moderate assistance Where Assessed - Upper Body Bathing: Supine, head of bed up Lower Body Bathing: Moderate assistance Where Assessed - Lower Body Bathing: Supine, head of bed up;Rolling right and/or left Upper Body Dressing: Maximal assistance Where Assessed - Upper Body Dressing: Supine, head of bed up;Supported sitting Lower Body Dressing: +1 Total assistance Where Assessed - Lower Body Dressing: Supine, head of bed up;Rolling right and/or left Toilet Transfer: +1 Total  assistance (unable to safely perform) Transfers/Ambulation Related to ADLs: Unable to attempt sit to stand due to pt. became nauseated, increased HR, and diaphoretic EOB.  Pt. also requiring max A to maintain EOB sitting ADL Comments: Pt. with weakness Rt. UE, which is dominant UE.  Needs assist for use of Rt. UE.  Unable to attempt grooming EOB due to poor tolerance for EOB sitting, and pt. requires max A  Cognition: Cognition Arousal/Alertness: Awake/alert Orientation Level: Oriented X4 Cognition Overall Cognitive Status: Appears within functional limits for tasks assessed/performed Arousal/Alertness: Awake/alert Orientation Level: Oriented X4 / Intact Behavior During Session: [facility] for tasks performed Cognition - Other Comments: When pt. moved to sitting, responses became delayed   Blood  pressure 141/69, pulse 101, temperature 97.4 F (36.3 C), temperature source Oral, resp. rate 18, height 5\' 5"  (1.651 m), weight 97.433 kg (214 lb 12.8 oz), SpO2 94.00%.  Physical Exam  Nursing note and vitals reviewed. Constitutional: She is oriented to person, place, and time. She appears well-developed and well-nourished.  HENT:  Head: Normocephalic and atraumatic.       Wears upper and partial lower dentures.  Eyes: Pupils are equal, round, and reactive to light.  Neck: Normal range of motion. Neck supple.  Cardiovascular: Normal rate and regular rhythm.   Pulmonary/Chest: Effort normal and breath sounds normal. She has no wheezes. She has no rales.  Abdominal: Soft. Bowel sounds are normal.  Neurological: She is alert and oriented to person, place, and time.       Speech clear. Follows commands without difficulty. Flat affect. Right sided weakness UE>LE. RUE is 4/5. RLE 4/5. No gross sensory deficits. Cognitively she displays good insight and awareness.  Skin: Skin is warm and dry.       Chest wall and R-shin incision clean and dry without erythema. Well-approximated    Results for orders placed during the hospital encounter of 09/05/12 (from the past 48 hour(s))  GLUCOSE, CAPILLARY     Status: Abnormal   Collection Time   09/08/12 12:05 PM      Component Value Range Comment   Glucose-Capillary 124 (*) 70 - 99 mg/dL    Comment 1 Documented in Chart      Comment 2 Notify RN     GLUCOSE, CAPILLARY     Status: Abnormal   Collection Time   09/08/12  3:59 PM      Component Value Range Comment   Glucose-Capillary 171 (*) 70 - 99 mg/dL   GLUCOSE, CAPILLARY     Status: Abnormal   Collection Time   09/08/12  7:25 PM      Component Value Range Comment   Glucose-Capillary 282 (*) 70 - 99 mg/dL    Comment 1 Notify RN      Comment 2 Documented in Chart     GLUCOSE, CAPILLARY     Status: Normal   Collection Time   09/09/12 12:07 AM      Component Value Range Comment   Glucose-Capillary  95  70 - 99 mg/dL    Comment 1 Notify RN      Comment 2 Documented in Chart     GLUCOSE, CAPILLARY     Status: Normal   Collection Time   09/09/12  3:12 AM      Component Value Range Comment   Glucose-Capillary 84  70 - 99 mg/dL    Comment 1 Notify RN      Comment 2 Documented in Chart     BASIC METABOLIC PANEL  Status: Abnormal   Collection Time   09/09/12  3:44 AM      Component Value Range Comment   Sodium 134 (*) 135 - 145 mEq/L    Potassium 3.5  3.5 - 5.1 mEq/L    Chloride 97  96 - 112 mEq/L    CO2 32  19 - 32 mEq/L    Glucose, Bld 103 (*) 70 - 99 mg/dL    BUN 13  6 - 23 mg/dL    Creatinine, Ser 0.61  0.50 - 1.10 mg/dL    Calcium 8.3 (*) 8.4 - 10.5 mg/dL    GFR calc non Af Amer 86 (*) >90 mL/min    GFR calc Af Amer >90  >90 mL/min   CBC     Status: Abnormal   Collection Time   09/09/12  3:44 AM      Component Value Range Comment   WBC 8.9  4.0 - 10.5 K/uL    RBC 3.66 (*) 3.87 - 5.11 MIL/uL    Hemoglobin 10.7 (*) 12.0 - 15.0 g/dL    HCT 31.7 (*) 36.0 - 46.0 %    MCV 86.6  78.0 - 100.0 fL    MCH 29.2  26.0 - 34.0 pg    MCHC 33.8  30.0 - 36.0 g/dL    RDW 14.3  11.5 - 15.5 %    Platelets 221  150 - 400 K/uL   LIPID PANEL     Status: Abnormal   Collection Time   09/09/12  3:44 AM      Component Value Range Comment   Cholesterol 154  0 - 200 mg/dL    Triglycerides 157 (*) <150 mg/dL    HDL 31 (*) >39 mg/dL    Total CHOL/HDL Ratio 5.0      VLDL 31  0 - 40 mg/dL    LDL Cholesterol 92  0 - 99 mg/dL   GLUCOSE, CAPILLARY     Status: Abnormal   Collection Time   09/09/12  7:30 AM      Component Value Range Comment   Glucose-Capillary 103 (*) 70 - 99 mg/dL   GLUCOSE, CAPILLARY     Status: Abnormal   Collection Time   09/09/12 11:14 AM      Component Value Range Comment   Glucose-Capillary 151 (*) 70 - 99 mg/dL   GLUCOSE, CAPILLARY     Status: Abnormal   Collection Time   09/09/12  5:25 PM      Component Value Range Comment   Glucose-Capillary 112 (*) 70 - 99 mg/dL    GLUCOSE, CAPILLARY     Status: Abnormal   Collection Time   09/09/12  9:36 PM      Component Value Range Comment   Glucose-Capillary 115 (*) 70 - 99 mg/dL    Comment 1 Documented in Chart      Comment 2 Notify RN     CBC     Status: Abnormal   Collection Time   09/10/12  3:48 AM      Component Value Range Comment   WBC 9.5  4.0 - 10.5 K/uL    RBC 3.80 (*) 3.87 - 5.11 MIL/uL    Hemoglobin 11.2 (*) 12.0 - 15.0 g/dL    HCT 33.0 (*) 36.0 - 46.0 %    MCV 86.8  78.0 - 100.0 fL    MCH 29.5  26.0 - 34.0 pg    MCHC 33.9  30.0 - 36.0 g/dL    RDW 14.2  11.5 - 15.5 %    Platelets 289  150 - 400 K/uL DELTA CHECK NOTED  BASIC METABOLIC PANEL     Status: Abnormal   Collection Time   09/10/12  3:48 AM      Component Value Range Comment   Sodium 135  135 - 145 mEq/L    Potassium 3.7  3.5 - 5.1 mEq/L    Chloride 98  96 - 112 mEq/L    CO2 31  19 - 32 mEq/L    Glucose, Bld 127 (*) 70 - 99 mg/dL    BUN 15  6 - 23 mg/dL    Creatinine, Ser 0.72  0.50 - 1.10 mg/dL    Calcium 8.6  8.4 - 10.5 mg/dL    GFR calc non Af Amer 81 (*) >90 mL/min    GFR calc Af Amer >90  >90 mL/min   GLUCOSE, CAPILLARY     Status: Abnormal   Collection Time   09/10/12  6:06 AM      Component Value Range Comment   Glucose-Capillary 139 (*) 70 - 99 mg/dL    No results found.  Post Admission Physician Evaluation: 1. Functional deficits secondary  to left medial frontal parietal infarct felt to be embolic. 2. Patient is admitted to receive collaborative, interdisciplinary care between the physiatrist, rehab nursing staff, and therapy team. 3. Patient's level of medical complexity and substantial therapy needs in context of that medical necessity cannot be provided at a lesser intensity of care such as a SNF. 4. Patient has experienced substantial functional loss from his/her baseline which was documented above under the "Functional History" and "Functional Status" headings.  Judging by the patient's diagnosis, physical  exam, and functional history, the patient has potential for functional progress which will result in measurable gains while on inpatient rehab.  These gains will be of substantial and practical use upon discharge  in facilitating mobility and self-care at the household level. 5. Physiatrist will provide 24 hour management of medical needs as well as oversight of the therapy plan/treatment and provide guidance as appropriate regarding the interaction of the two. 6. 24 hour rehab nursing will assist with bladder management, bowel management, safety, skin/wound care, disease management, medication administration, pain management and patient education  and help integrate therapy concepts, techniques,education, etc. 7. PT will assess and treat for:  Lower extremity strength, range of motion, stamina, balance, functional mobility, safety, adaptive techniques and equipment, NMR.  Goals are: mod I to supervision. 8. OT will assess and treat for: ADL's, functional mobility, safety, upper extremity strength, adaptive techniques and equipment, NMR.   Goals are: mod I to minimal assist. 9. SLP will assess and treat for: n/a.  Goals are: n/a. 10. Case Management and Social Worker will assess and treat for psychological issues and discharge planning. 11. Team conference will be held weekly to assess progress toward goals and to determine barriers to discharge. 12. Patient will receive at least 3 hours of therapy per day at least 5 days per week. 13. ELOS: 10 days      Prognosis:  excellent   Medical Problem List and Plan: 1. DVT Prophylaxis/Anticoagulation: Pharmaceutical: Lovenox 2. Pain Management: oxycodone prn for pain. 3. Mood: Flat affect noted. Will monitor for now. LCSW to follow up  4. Neuropsych: This patient IS capable of making decisions on his/her own behalf. 5. HTN: Monitor with bid checks. Continue catapres, metoprolol and lasix.  6. Breast cancer: Femara on hold.  7. Fluid overload: Check daily  weights. Continue lasix bid.  8. DM type 2: Continue lantus 24 units bid.  CBG checks AC/HS with SSI for BS control to help with wound healing. Continue to hold amaryl. Anticipated upward trend as appetite improves.  9. Constipation: reports poor appetite with nausea. SSE today. Augment bowel program  Oval Linsey, MD 09/10/2012

## 2012-09-10 NOTE — Progress Notes (Signed)
   CARDIOTHORACIC SURGERY PROGRESS NOTE  5 Days Post-Op  S/P Procedure(s) (LRB): CORONARY ARTERY BYPASS GRAFTING (CABG) (N/A) INTRAOPERATIVE TRANSESOPHAGEAL ECHOCARDIOGRAM (N/A)  Subjective: No complaints.  Minimal pain.  Denies SOB.  Fairly good appetite.  Objective: Vital signs in last 24 hours: Temp:  [97.3 F (36.3 C)-98.3 F (36.8 C)] 97.4 F (36.3 C) (12/10 0314) Pulse Rate:  [86-131] 101  (12/10 0314) Cardiac Rhythm:  [-] Sinus tachycardia;Normal sinus rhythm (12/09 1915) Resp:  [15-24] 18  (12/10 0314) BP: (100-144)/(49-99) 141/69 mmHg (12/10 0314) SpO2:  [81 %-98 %] 94 % (12/10 0314) Weight:  [97.433 kg (214 lb 12.8 oz)] 97.433 kg (214 lb 12.8 oz) (12/10 0314)  Physical Exam:  Rhythm:   Sinus tach  Breath sounds: Diminished at bases  Heart sounds:  RRR  Incisions:  Clean and dry  Abdomen:  soft  Extremities:  warm   Intake/Output from previous day: 12/09 0701 - 12/10 0700 In: 814 [P.O.:600; I.V.:60; IV Piggyback:154] Out: 2700 [Urine:2700] Intake/Output this shift:    Lab Results:  Basename 09/10/12 0348 09/09/12 0344  WBC 9.5 8.9  HGB 11.2* 10.7*  HCT 33.0* 31.7*  PLT 289 221   BMET:  Basename 09/10/12 0348 09/09/12 0344  NA 135 134*  K 3.7 3.5  CL 98 97  CO2 31 32  GLUCOSE 127* 103*  BUN 15 13  CREATININE 0.72 0.61  CALCIUM 8.6 8.3*    CBG (last 3)   Basename 09/10/12 0606 09/09/12 2136 09/09/12 1725  GLUCAP 139* 115* 112*   PT/INR:  No results found for this basename: LABPROT,INR in the last 72 hours  CXR:  N/A  Assessment/Plan: S/P Procedure(s) (LRB): CORONARY ARTERY BYPASS GRAFTING (CABG) (N/A) INTRAOPERATIVE TRANSESOPHAGEAL ECHOCARDIOGRAM (N/A)  Overall stable Stroke w/ improving right hemiparesis Expected post op acute blood loss anemia, mild, stable Expected post op volume excess, mild, diuresing Type II diabetes mellitus, good glycemic control   Potentially ready for transfer to inpatient rehab service  Continue  PT/OT  Increase metoprolol dose   [name] H 09/10/2012 8:23 AM

## 2012-09-10 NOTE — Progress Notes (Signed)
Rehab admissions - Evaluated for possible admission.  I spoke with patient and her husband at the bedside today.  I also spoke with husband by phone yesterday.  They are both in agreement with inpatient rehab here at [facility].  I have called insurance carrier and will fax information to insurance carrier.  I hope to hear later this pm.  I am hopeful for inpatient rehab admit later today.  Call me for questions.  RC:[phone]

## 2012-09-10 NOTE — Discharge Summary (Signed)
[address]            [place] [address]          [phone]         Discharge Summary  Name: [name] DOB: [birth year] 78 y.o. MRN: [id number]   Admission Date: 09/05/2012 Discharge Date:     Admitting Diagnosis: Severe 3-vessel Coronary Artery Disease   Discharge Diagnosis:  Severe 3-vessel Coronary Artery Disease Expected postoperative blood loss anemia Left medial frontoparietal CVA  Past Medical History  Diagnosis Date  . Breast mass, right   . Vertigo   . Thyroid disease   . History of breast cancer     39 treatments of radiation. Negative chemo.  . Diabetes   . GERD (gastroesophageal reflux disease)   . Hyperlipemia   . History of seasonal allergies   . Arthritis   . Angina pectoris 08/26/2012  . Complication of anesthesia   . PONV (postoperative nausea and vomiting)   . Hypertension     sees Dr. [place] Reek  . Coronary artery disease 08/22/2012    sees Dr Rockey Situ  . Pneumonia     hx of  . Headache     migraines  . Cancer     breast cancer, right side  . Gastritis     hx of  . 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 female with no previous history of coronary artery disease but risk factors notable for history of hypertension, type 2 diabetes mellitus, hyperlipidemia, a family history of coronary artery disease and a remote history of tobacco use. The patient describes a greater than one-year history of tightness in her throat which is brought on with physical activity and relieved by rest. She had been less active  since last fall due to treatment for breast cancer, and her symptoms were not as prominent. However, this past summer she seemed to get her energy back, and with added physical activity, she began to experience the throat tightness with associated shortness of breath. She was seen by her otolaryngologist and subsequently referred to Dr. Rockey Situ because of concerns that her symptoms might represent angina pectoris. She underwent cardiac catheterization and was found to have 70% stenosis of the left main coronary artery with severe three-vessel coronary artery disease and preserved left ventricular function. The patient was referred to Dr. Roxy Manns for possible elective surgical revascularization. He agreed with the need for CABG.  All risks, benefits and alternatives of surgery were explained in detail, and the patient agreed to proceed.     Hospital Course:  The patient was admitted to [facility] on 09/05/2012. The patient was taken to the operating room and underwent the above procedure.    The postoperative course was initially uneventful, until on postop day 2, she developed right sided weakness and some changes in speech.  A neurology consult was obtained, and a CT of the head was performed, which showed a left medial frontoparietal infarct.  This  was felt to be embolic secondary to CABG.  She was continued on aspirin, and treated with physical and occupational therapy.  Her deficits have improved significantly, but she remains weak and deconditioned.    Otherwise, she has remained stable postoperatively.  From a cardiac standpoint, she has remained in sinus rhythm, and blood pressures are controlled.  She has been mildly anemic, but has not required transfusion.  She is diuresing well with Lasix.  She is tolerating a regular diet.  Her blood sugars are controlled on Lantus. She is progressing well, and is medically stable for transfer to inpatient rehab when a bed is available.      Recent vital signs:    Filed Vitals:   09/10/12 0314  BP: 141/69  Pulse: 101  Temp: 97.4 F (36.3 C)  Resp: 18    Recent laboratory studies:  CBC: Basename 09/10/12 0348 09/09/12 0344  WBC 9.5 8.9  HGB 11.2* 10.7*  HCT 33.0* 31.7*  PLT 289 221   BMET:  Basename 09/10/12 0348 09/09/12 0344  NA 135 134*  K 3.7 3.5  CL 98 97  CO2 31 32  GLUCOSE 127* 103*  BUN 15 13  CREATININE 0.72 0.61  CALCIUM 8.6 8.3*    PT/INR: No results found for this basename: LABPROT,INR in the last 72 hours     Discharge Medications:     Medication List     As of 09/10/2012  9:16 AM    STOP taking these medications         cloNIDine 0.2 MG tablet   Commonly known as: CATAPRES      glimepiride 2 MG tablet   Commonly known as: AMARYL      ibuprofen 200 MG tablet   Commonly known as: ADVIL,MOTRIN      letrozole 2.5 MG tablet   Commonly known as: FEMARA      nitroGLYCERIN 0.4 MG SL tablet   Commonly known as: NITROSTAT      TAKE these medications         aspirin 325 MG EC tablet   Take 325 mg by mouth daily.      cloNIDine 0.2 mg/24hr patch   Commonly known as: CATAPRES - Dosed in mg/24 hr   Place 1 patch (0.2 mg total) onto the skin once a week.      furosemide 40 MG tablet   Commonly known as: LASIX   Take 1 tablet (40 mg total) by mouth 2 (two) times daily.      insulin glargine 100 UNIT/ML injection   Commonly known as: LANTUS   Inject 24 Units into the skin 2 (two) times daily.      levothyroxine 125 MCG tablet   Commonly known as: SYNTHROID, LEVOTHROID   Take 125 mcg by mouth daily.      metoprolol tartrate 25 MG tablet   Commonly known as: LOPRESSOR   Take 2 tablets (50 mg total) by mouth 2 (two) times daily.      pantoprazole 40 MG tablet   Commonly known as: PROTONIX   Take 1 tablet (40 mg total) by mouth daily.      potassium chloride SA 20 MEQ tablet   Commonly known as: K-DUR,KLOR-CON   Take 1 tablet (20 mEq total) by mouth 2 (two) times daily.      traMADol 50 MG  tablet   Commonly known as: ULTRAM   Take 1-2 tablets (50-100 mg total) by mouth every 4 (four) hours as needed for pain.  Discharge Instructions:  The patient is to refrain from driving, heavy lifting or strenuous activity.  May shower daily and clean incisions with soap and water.  May resume regular diet.   Follow Up:  . Follow-up Information    Follow up with Rexene Alberts, MD. (Call to schedule outpatient followup)    Contact information:   [address] [place] [address] [phone]       Follow up with Ida Rogue, MD. (Call to schedule outpatient followup)    Contact information:   [place] [address] [phone]             Burke Keels 09/10/2012, 9:16 AM

## 2012-09-11 ENCOUNTER — Inpatient Hospital Stay (HOSPITAL_COMMUNITY): Payer: Medicare Other | Admitting: Physical Therapy

## 2012-09-11 ENCOUNTER — Inpatient Hospital Stay (HOSPITAL_COMMUNITY): Payer: Medicare Other | Admitting: Occupational Therapy

## 2012-09-11 DIAGNOSIS — I6340 Cerebral infarction due to embolism of unspecified cerebral artery: Secondary | ICD-10-CM

## 2012-09-11 DIAGNOSIS — R5381 Other malaise: Secondary | ICD-10-CM

## 2012-09-11 DIAGNOSIS — G8110 Spastic hemiplegia affecting unspecified side: Secondary | ICD-10-CM

## 2012-09-11 LAB — CBC WITH DIFFERENTIAL/PLATELET
Basophils Absolute: 0.1 10*3/uL (ref 0.0–0.1)
Basophils Relative: 1 % (ref 0–1)
Eosinophils Absolute: 0.5 10*3/uL (ref 0.0–0.7)
Eosinophils Relative: 5 % (ref 0–5)
HCT: 35.1 % — ABNORMAL LOW (ref 36.0–46.0)
Hemoglobin: 11.8 g/dL — ABNORMAL LOW (ref 12.0–15.0)
Lymphocytes Relative: 19 % (ref 12–46)
Lymphs Abs: 1.9 10*3/uL (ref 0.7–4.0)
MCH: 28.9 pg (ref 26.0–34.0)
MCHC: 33.6 g/dL (ref 30.0–36.0)
MCV: 86 fL (ref 78.0–100.0)
Monocytes Absolute: 0.7 10*3/uL (ref 0.1–1.0)
Monocytes Relative: 7 % (ref 3–12)
Neutro Abs: 7 10*3/uL (ref 1.7–7.7)
Neutrophils Relative %: 68 % (ref 43–77)
Platelets: 324 10*3/uL (ref 150–400)
RBC: 4.08 MIL/uL (ref 3.87–5.11)
RDW: 14 % (ref 11.5–15.5)
WBC: 10.2 10*3/uL (ref 4.0–10.5)

## 2012-09-11 LAB — GLUCOSE, CAPILLARY
Glucose-Capillary: 130 mg/dL — ABNORMAL HIGH (ref 70–99)
Glucose-Capillary: 160 mg/dL — ABNORMAL HIGH (ref 70–99)
Glucose-Capillary: 118 mg/dL — ABNORMAL HIGH (ref 70–99)
Glucose-Capillary: 150 mg/dL — ABNORMAL HIGH (ref 70–99)
Glucose-Capillary: 106 mg/dL — ABNORMAL HIGH (ref 70–99)

## 2012-09-11 LAB — COMPREHENSIVE METABOLIC PANEL
ALT: 14 U/L (ref 0–35)
AST: 23 U/L (ref 0–37)
Albumin: 2.7 g/dL — ABNORMAL LOW (ref 3.5–5.2)
Alkaline Phosphatase: 101 U/L (ref 39–117)
BUN: 16 mg/dL (ref 6–23)
CO2: 29 mEq/L (ref 19–32)
Calcium: 8.9 mg/dL (ref 8.4–10.5)
Chloride: 95 mEq/L — ABNORMAL LOW (ref 96–112)
Creatinine, Ser: 0.75 mg/dL (ref 0.50–1.10)
GFR calc Af Amer: >90 mL/min (ref >90)
GFR calc non Af Amer: 80 mL/min — ABNORMAL LOW (ref >90)
Glucose, Bld: 124 mg/dL — ABNORMAL HIGH (ref 70–99)
Potassium: 3.9 mEq/L (ref 3.5–5.1)
Sodium: 134 mEq/L — ABNORMAL LOW (ref 135–145)
Total Bilirubin: 0.4 mg/dL (ref 0.3–1.2)
Total Protein: 7.3 g/dL (ref 6.0–8.3)

## 2012-09-11 NOTE — Progress Notes (Signed)
Occupational Therapy Session Note  Patient Details  Name: [name] MRN: [id number] Date of Birth: 1934/07/09  Today's Date: 09/11/2012 Time: 0930-1030 and 1335-1405 Time Calculation (min): 60 min and 30 min  Short Term Goals: Week 1:  OT Short Term Goal 1 (Week 1): Pt will complete toilet transfer with mod assist OT Short Term Goal 2 (Week 1): Pt will complete bathing with mod assist OT Short Term Goal 3 (Week 1): Pt will complete complete UB dressing with mod assist OT Short Term Goal 4 (Week 1): Pt will complete LB dressing with max assist  Skilled Therapeutic Interventions/Progress Updates:  Balance/vestibular training;Community reintegration;Discharge planning;DME/adaptive equipment instruction;Functional mobility training;Neuromuscular re-education;Patient/family education;Psychosocial support;Self Care/advanced ADL retraining;Therapeutic Activities;Therapeutic Exercise;UE/LE Strength taining/ROM;UE/LE Coordination activities   1) OT eval completed, followed by ADL assessment with focus on bathing at sink side.  Pt able to recall sternal precautions, however required cues to adhere to precautions during sit <> stand and transfers.  Pt required +2 for sit <> stand and standing balance for perineal hygiene due to Rt lateral lean in standing and required physical assistance to block Rt knee in standing.  Pt able to straighten knee for a few seconds with cues and demonstrated increased standing balance and posture.  During standing, pt reports increased nausea and broke out in to cold sweat and became clammy.  Pt requested to return to bed post bathing.  Max assist stand pivot transfer to bed with cues for BLE use to compensate for decreased use of UE secondary to sternal precautions.  2) 1:1 OT with focus on bed mobility, sit <> stand, stand pivot transfers, and education on sternal precautions.  Provided pt with handout of sternal precautions and posted above bed.  Discussed each  precaution and how it pertains to mobility and self-care tasks.  Pt reported understanding and states "I don't want to overdue it or cause myself additional injury".  Static sitting balance approx 5 mins with min cues for postural control and correcting Rt lateral lean.  Sit to stand with mod assist with cues to use BLE and not push up with hands.    Therapy Documentation Precautions:  Precautions Precautions: Sternal;Fall Precaution Comments: DM; becomes clammy and diaphoretic with activity.  Fall to R and posterior Restrictions Weight Bearing Restrictions: No General:   Vital Signs: Therapy Vitals Pulse Rate: 114  BP: 143/76 mmHg Patient Position, if appropriate: Sitting Oxygen Therapy SpO2: 89 % O2 Device: None (Room air) Pain: Pain Assessment Pain Assessment: No/denies pain Pain Score: 0-No pain ADL: ADL Grooming: Maximal assistance Where Assessed-Grooming: Sitting at sink Upper Body Bathing: Minimal assistance Where Assessed-Upper Body Bathing: Sitting at sink Lower Body Bathing: Dependent Where Assessed-Lower Body Bathing: Sitting at sink;Standing at sink Upper Body Dressing:  (no clothes on eval) Lower Body Dressing:  (no clothes on eval) Toilet Transfer: Maximal assistance Toilet Transfer Method: Stand pivot  See FIM for current functional status  Therapy/Group: Individual Therapy  Sim Boast 09/11/2012, 11:45 AM

## 2012-09-11 NOTE — Evaluation (Signed)
Physical Therapy Assessment and Plan  Patient Details  Name: [name] MRN: [id number] Date of Birth: 1934-03-29  PT Diagnosis: Abnormality of gait, Difficulty walking, Edema, Hemiplegia dominant, Impaired sensation and Muscle weakness Rehab Potential: Good ELOS: 2-2.5 weeks   Today's Date: 09/11/2012 Time: 0800-0900 Time Calculation (min): 60 min  Problem List:  Patient Active Problem List  Diagnosis  . Throat pain  . Shortness of breath  . Hyperlipidemia  . Diabetes  . Hypertension  . Breast cancer  . GERD (gastroesophageal reflux disease)  . Coronary artery disease  . Angina pectoris  . S/P CABG x 4  . Cerebral embolism with cerebral infarction  . Hemiplegia, unspecified, affecting dominant side    Past Medical History:  Past Medical History  Diagnosis Date  . Breast mass, right   . Vertigo   . Thyroid disease   . History of breast cancer     39 treatments of radiation. Negative chemo.  . Diabetes   . GERD (gastroesophageal reflux disease)   . Hyperlipemia   . History of seasonal allergies   . Arthritis   . Angina pectoris 08/26/2012  . Complication of anesthesia   . PONV (postoperative nausea and vomiting)   . Hypertension     sees Dr. F[place] Reek  . Coronary artery disease 08/22/2012    sees Dr Rockey Situ  . Pneumonia     hx of  . Headache     migraines  . Cancer     breast cancer, right side  . Gastritis     hx of  . 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 Abdominal hysterectomy   . Ovarian cyst removal   . Back surgery   . Appendectomy   . Breast surgery   . Laparoscopic nissen fundoplication   . Coronary artery bypass graft 09/05/2012    Procedure: CORONARY ARTERY BYPASS GRAFTING (CABG);  Surgeon: Rexene Alberts, MD;  Location: [facility];  Service: Open Heart Surgery;  Laterality: N/A;  CABG x four, using  left internal mammary artery and bilateral greater saphenous vein harvested endoscopically  . Intraoperative transesophageal echocardiogram 09/05/2012    Procedure: INTRAOPERATIVE TRANSESOPHAGEAL ECHOCARDIOGRAM;  Surgeon: Rexene Alberts, MD;  Location: [facility];  Service: Open Heart Surgery;  Laterality: N/A;    Assessment & Plan Clinical Impression: Patient is a 78 y.o. right-handed female with history of coronary artery disease, hypertension, type 2 diabetes mellitus and remote tobacco abuse. Admitted 09/03/2012 with shortness of breath on exertion and tightness in her chest. Cardiac catheterization showed 70% stenosis of the left main coronary artery with severe three-vessel coronary artery disease with preserved left ventricular function. She underwent coronary artery bypass grafting x4 09/05/2012 by Dr. Roxy Manns. Postoperative day 2 with noted right-sided weakness. CT scan imaging showed subacute left medial frontal parietal infarct felt to be embolic secondary to CABG and old right frontal lobe lacunar infarct. Neurology consulted and recommended continuing aspirin therapy as well as the addition of subcutaneous Lovenox for DVT prophylaxis. Pre op carotid dopplers without ICA stenosis.  Patient transferred to CIR on 09/10/2012 .   Patient currently requires max-total A with mobility secondary to muscle weakness, decreased cardiorespiratoy endurance, impaired timing and sequencing and decreased motor planning and impaired sensation and decreased sitting balance, decreased standing balance, decreased postural control, hemiplegia and decreased  balance strategies and difficulty with adherence to sternal precautions.  Prior to hospitalization, patient was fully independent with mobility and lived with Spouse in a House home.  Home access is 3, no railsStairs to enter.  Patient will benefit from skilled PT intervention to maximize safe functional mobility, minimize fall risk and decrease caregiver burden for  planned discharge home with 24 hour supervision of husband and intermittent assistance for home entry/exit.  Anticipate patient will benefit from follow up [place] at discharge.  PT - End of Session Activity Tolerance: Decreased this session;Tolerates 10 - 20 min activity with multiple rests Endurance Deficit: Yes Endurance Deficit Description: Recent CABG PT Assessment Rehab Potential: Good Barriers to Discharge: None PT Plan PT Frequency: 1-2 X/day, 60-90 minutes;5 out of 7 days Estimated Length of Stay: 2-2.5 weeks PT Treatment/Interventions: Ambulation/gait training;Balance/vestibular training;Community reintegration;Discharge planning;DME/adaptive equipment instruction;Functional mobility training;Neuromuscular re-education;Patient/family education;Stair training;Therapeutic Activities;Therapeutic Exercise;UE/LE Strength taining/ROM;UE/LE Coordination activities PT Recommendation Follow Up Recommendations: Home health PT Equipment Recommended: Rolling walker with 5" wheels;Wheelchair (measurements);Wheelchair cushion (measurements);Cane Equipment Details: Cane vs. RW TBD as patient progresses  PT Evaluation Precautions/Restrictions Precautions Precautions: Sternal;Fall Precaution Comments: DM; becomes clammy and diaphoretic with activity.  Fall to R and posterior General @[id number] ([id number]) Vital Signs Therapy Vitals Temp: 97.5 F (36.4 C) Temp src: Oral Pulse Rate: 114  Resp: 19  BP: 143/76 mmHg Patient Position, if appropriate: Sitting Oxygen Therapy SpO2: 89 % O2 Device: None (Room air) Pain Pain Assessment Pain Assessment: No/denies pain Home Living/Prior Functioning Home Living Lives With: Spouse Available Help at Discharge: Family;Friend(s);Available 24 hours/day Type of Home: House Home Access: Stairs to enter [employer] of Steps: 3, no rails Entrance Stairs-Rails: None Home Layout: One level Home Adaptive Equipment: None Prior  Function Level of Independence: Independent with transfers;Independent with gait;Independent with homemaking with ambulation Able to Take Stairs?: Yes Driving: Yes Vocation: Retired Associate Professor Overall Cognitive Status: Appears within functional limits for tasks assessed Sensation Sensation Light Touch: Impaired by gross assessment (tingling in RLE) Stereognosis: Not tested Hot/Cold: Not tested Proprioception: Appears Intact Coordination Gross Motor Movements are Fluid and Coordinated: Not tested Motor  Motor Motor: Hemiplegia;Abnormal postural alignment and control Motor - Skilled Clinical Observations: R weakness, R lateral and posterior lean/LOB in sitting and standing  Mobility Bed Mobility Bed Mobility: Supine to Sit;Sit to Supine Supine to Sit: 1: +1 Total assist;HOB flat Supine to Sit Details (indicate cue type and reason): Patient unable to perform supine > sit from flat bed without rails secondary to weakness and sternal precautions.  Educated on all sternal precautions and handout given; educated on log roll technique while hugging pillow; patient required total A to perform full roll to L side and for side > sit EOB.   Transfers Stand Pivot Transfers: 2: Max assist;From elevated surface Stand Pivot Transfer Details (indicate cue type and reason): Required lifting and controlled lowering assistance with one HHA with verbal cues to minimize pushing through UE; required assistance to stabilize in standing and to maintain balance secondary to posterior and R lateral lean/LOB Locomotion  Ambulation Ambulation/Gait Assistance: 1: +2 Total assist Ambulation Distance (Feet): 25 Feet Assistive device: 2 person hand held assist Ambulation/Gait Assistance Details: Required +2 HHA for gait in controlled environment x 25' secondary to posterior and R lateral LOB; patient fatigued very quickly.   Gait Gait Pattern: Step-to pattern;Decreased step length - right;Decreased stance time -  right;Decreased stride length;Decreased weight shift to left;Lateral trunk lean to right;Decreased trunk rotation;Narrow base of support Stairs / Additional Locomotion Stairs:  Yes Stairs Assistance: 2: Max Armed forces technical officer Details (indicate cue type and reason): Discussed stair situation at home for entry/exit; reports no rails present; discussed need for installation of rails secondary to impaired balance, strength and endurance to maximize safety for patient and husband home entry/exit.  Performed up and down 2 short stairs forwards to ascend and retro to descend with bilat UE support and verbal and visual cues for safe sequence and to fully translate COG forward in order to minimize pulling through UE Stair Management Technique: Two rails;Step to pattern;Forwards;Backwards Number of Stairs: 2  Height of Stairs: 4  Wheelchair Mobility Wheelchair Mobility: No Distance: 150 total A secondary to sternal precautions  Trunk/Postural Assessment  Cervical Assessment Cervical Assessment: Within Functional Limits Thoracic Assessment Thoracic Assessment: Within Functional Limits Lumbar Assessment Lumbar Assessment: Exceptions to [place] [facility] (h/o lumbar surgery, increased lordosis) Postural Control Postural Control: Deficits on evaluation (R lateral and posterior lean and LOB)  Balance Static Sitting Balance Static Sitting - Balance Support: Feet supported Static Sitting - Level of Assistance: 5: Stand by assistance;4: Min assist Static Sitting - Comment/# of Minutes: Secondary to R lateral and posterior LOB Dynamic Sitting Balance Dynamic Sitting - Balance Support: Feet supported Dynamic Sitting - Level of Assistance: 3: Mod assist Static Standing Balance Static Standing - Balance Support: Left upper extremity supported Static Standing - Level of Assistance: 3: Mod assist Dynamic Standing Balance Dynamic Standing - Balance Support: Bilateral upper extremity supported Dynamic Standing - Level  of Assistance: 1: +2 Total assist Extremity Assessment  RLE Assessment RLE Assessment: Exceptions to [facility] RLE Strength RLE Overall Strength: Deficits RLE Overall Strength Comments: 4-/5 overall; poor functional endurance LLE Assessment LLE Assessment: Within Functional Limits  See FIM for current functional status Refer to Care Plan for Long Term Goals  Recommendations for other services: None  Discharge Criteria: Patient will be discharged from PT if patient refuses treatment 3 consecutive times without medical reason, if treatment goals not met, if there is a change in medical status, if patient makes no progress towards goals or if patient is discharged from hospital.  The above assessment, treatment plan, treatment alternatives and goals were discussed and mutually agreed upon: by patient and by family  Malachy Mood 09/11/2012, 9:28 AM

## 2012-09-11 NOTE — Progress Notes (Addendum)
Overall Plan of Care [facility]) Patient Details Name: [name] MRN: [id number] DOB: 12-08-1933  Diagnosis:  Rehabilitation for stroke  Primary Diagnosis:    Cerebral embolism with cerebral infarction Co-morbidities: coronary artery disease status post recent CABG  Functional Problem List  Patient demonstrates impairments in the following areas: Balance, Edema, Endurance, Motor and Sensory   Basic ADL's: grooming, bathing, dressing and toileting Advanced ADL's: simple meal preparation, laundry and light housekeeping  Transfers:  toilet and tub/shower Locomotion:  ambulation, wheelchair mobility and stairs  Additional Impairments:  None  Anticipated Outcomes Item Anticipated Outcome  Eating/Swallowing     Basic self-care  Mod I - Supervision  Tolieting    Bowel/Bladder   continent of bowel/bladder  Transfers   Mod I except min A bed mobility secondary to sternal precautions   Locomotion   Mod I short distances  Communication    Cognition    Pain   less than or equal to 2  Safety/Judgment   min. assist  Other     Therapy Plan: PT Frequency: Total of 15 hours over 7 days Frequency due to: impaired activity tolerance secondary to recent CABG OT Frequency: 1-2 X/day, 60-90 minutes;Total of 15 hours over 7 days Frequency due to: Pt breaks out in cold sweat and reports increased nausea with movement, would benefit from decreased therapy schedule     Team Interventions: Item RN PT OT SLP SW TR Other  Self Care/Advanced ADL Retraining   x      Neuromuscular Re-Education  x x      Therapeutic Activities  x x      UE/LE Strength Training/ROM  x x      UE/LE Coordination Activities  x x      Visual/Perceptual Remediation/Compensation         DME/Adaptive Equipment Instruction  x x      Therapeutic Exercise  x x      Balance/Vestibular Training  x x      Patient/Family Education  x x      Cognitive Remediation/Compensation         Functional Mobility Training   x x      Ambulation/Gait Training  x       Visual merchandiser Reintegration  x x      Dysphagia/Aspiration Environmental consultant         Bladder Management x        Bowel Management x        Disease Management/Prevention         Pain Management x  x      Medication Management x        Skin Care/Wound Management x        Splinting/Orthotics         Discharge Planning  x x      Psychosocial Support   x                         Team Discharge Planning: Destination:  Home Projected Follow-up:  PT, OT and Home Health Projected Equipment Needs:  Bedside Commode, Cane, [place], Secondary school teacher involved in discharge planning:  Yes  MD ELOS: 10 days Medical Rehab Prognosis:  Good Assessment: 78 year old  female with CVA after CABG now requiring 75 7 rehabilitation M.D., RN, CIR level PT OT

## 2012-09-11 NOTE — Care Management Note (Signed)
Inpatient [facility] Individual Statement of Services  Patient Name:  [name]  Date:  09/11/2012  Welcome to the [facility].  Our goal is to provide you with an individualized program based on your diagnosis and situation, designed to meet your specific needs.  With this comprehensive rehabilitation program, you will be expected to participate in at least 3 hours of rehabilitation therapies Monday-Friday, with modified therapy programming on the weekends.  Your rehabilitation program will include the following services:  Physical Therapy (PT), Occupational Therapy (OT), Speech Therapy (ST), 24 hour per day rehabilitation nursing, Therapeutic Recreaction (TR), Case Management (RN and Education officer, museum), Rehabilitation Medicine, Nutrition Services and Pharmacy Services  Weekly team conferences will be held on Wednesday to discuss your progress.  Your RN Case Writer will talk with you frequently to get your input and to update you on team discussions.  Team conferences with you and your family in attendance may also be held.  Expected length of stay: 2-2.5 weeks  Overall anticipated outcome: Supervision/min level  Depending on your progress and recovery, your program may change.  Your RN Case Engineer, production will coordinate services and will keep you informed of any changes.  Your RN Tourist information centre manager and SW names and contact numbers are listed  below.  The following services may also be recommended but are not provided by the [facility] will be made to provide these services after discharge if needed.  Arrangements include referral to agencies that provide these services.  Your insurance has been verified to be:  [employer]-Medicare Your primary doctor is:  Dr Felipa Furnace  Pertinent information will be shared with your doctor and your insurance company.   Social Worker:  Ovidio Kin, [place]  Information discussed with and copy given to patient by: Elease Hashimoto, 09/11/2012, 8:58 AM

## 2012-09-11 NOTE — Progress Notes (Signed)
Patient information reviewed and entered into eRehab system by Rondel Episcopo, RN, CRRN, PPS Coordinator.  Information including medical coding and functional independence measure will be reviewed and updated through discharge.     Per nursing patient was given "Data Collection Information Summary for Patients in Inpatient Rehabilitation Facilities with attached "Privacy Act Statement-Health Care Records" upon admission.  

## 2012-09-11 NOTE — Progress Notes (Signed)
TCD completed. 

## 2012-09-11 NOTE — Progress Notes (Signed)
Physical Therapy Note  Patient Details  Name: [name] MRN: [id number] Date of Birth: [birth year] Today's Date: 09/11/2012  1410-1440 (30 minutes) individual Pain: no reported pain Focus of treatment: transfer training; standing balance Treatment: Pt up in wc ; transfers stand/turn with RW min assist wc >< mat ; sit to stand to bedside table min assist - pt report increased nausea . Returned to [employer]; returned room ; nurse notified; cold compress given.    Isaiha Asare,JIM 09/11/2012, 7:28 AM

## 2012-09-11 NOTE — Evaluation (Signed)
Occupational Therapy Assessment and Plan  Patient Details  Name: [name] MRN: [id number] Date of Birth: 08/16/1934  OT Diagnosis: abnormal posture, hemiplegia affecting dominant side and muscle weakness (generalized) Rehab Potential: Rehab Potential: Good ELOS: 2.5 weeks   Today's Date: 09/11/2012 Time: 0930-1030 Time Calculation (min): 60 min  Problem List:  Patient Active Problem List  Diagnosis  . Throat pain  . Shortness of breath  . Hyperlipidemia  . Diabetes  . Hypertension  . Breast cancer  . GERD (gastroesophageal reflux disease)  . Coronary artery disease  . Angina pectoris  . S/P CABG x 4  . Cerebral embolism with cerebral infarction  . Hemiplegia, unspecified, affecting dominant side    Past Medical History:  Past Medical History  Diagnosis Date  . Breast mass, right   . Vertigo   . Thyroid disease   . History of breast cancer     39 treatments of radiation. Negative chemo.  . Diabetes   . GERD (gastroesophageal reflux disease)   . Hyperlipemia   . History of seasonal allergies   . Arthritis   . Angina pectoris 08/26/2012  . Complication of anesthesia   . PONV (postoperative nausea and vomiting)   . Hypertension     sees Dr. Fulton Reek  . Coronary artery disease 08/22/2012    sees Dr Rockey Situ  . Pneumonia     hx of  . Headache     migraines  . Cancer     breast cancer, right side  . Gastritis     hx of  . 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 Abdominal hysterectomy   . Ovarian cyst removal   . Back surgery   . Appendectomy   . Breast surgery   . Laparoscopic nissen fundoplication   . Coronary artery bypass graft 09/05/2012    Procedure: CORONARY ARTERY BYPASS GRAFTING (CABG);  Surgeon: Rexene Alberts, MD;  Location: [facility];  Service: Open Heart Surgery;  Laterality: N/A;  CABG x four, using left  internal mammary artery and bilateral greater saphenous vein harvested endoscopically  . Intraoperative transesophageal echocardiogram 09/05/2012    Procedure: INTRAOPERATIVE TRANSESOPHAGEAL ECHOCARDIOGRAM;  Surgeon: Rexene Alberts, MD;  Location: [facility];  Service: Open Heart Surgery;  Laterality: N/A;    Assessment & Plan Clinical Impression: Patient is a 78 y.o. right-handed female with history of coronary artery disease, hypertension, type 2 diabetes mellitus and remote tobacco abuse. Admitted 09/03/2012 with shortness of breath on exertion and tightness in her chest. Cardiac catheterization showed 70% stenosis of the left main coronary artery with severe three-vessel coronary artery disease with preserved left ventricular function. She underwent coronary artery bypass grafting x4 09/05/2012 by Dr. Roxy Manns. Postoperative day 2 with noted right-sided weakness. CT scan imaging showed subacute left medial frontal parietal infarct felt to be embolic secondary to CABG and old right frontal lobe lacunar infarct. Neurology consulted and recommended continuing aspirin therapy as well as the addition of subcutaneous Lovenox for DVT prophylaxis. Pre op carotid dopplers without ICA stenosis   Patient transferred to CIR on 09/10/2012 .    Patient currently requires max with basic self-care skills secondary to muscle weakness, decreased cardiorespiratoy endurance, unbalanced muscle activation and decreased coordination and decreased sitting balance, decreased standing balance, decreased postural control and decreased balance strategies.  Prior  to hospitalization, patient could complete ADLs with independence.  Patient will benefit from skilled intervention to increase independence with basic self-care skills and increase level of independence with iADL prior to discharge home with care partner.  Anticipate patient will require intermittent supervision and follow up home health.  OT - End of Session Activity  Tolerance: Tolerates 30+ min activity with multiple rests Endurance Deficit: Yes Endurance Deficit Description: Recent CABG, gets diaphoretic breaks out in cold sweat with sit <>stand OT Assessment Rehab Potential: Good Barriers to Discharge: Decreased caregiver support Barriers to Discharge Comments: caregiver unable to provide physical assist OT Plan OT Frequency: 1-2 X/day, 60-90 minutes;Total of 15 hours over 7 days Frequency due to: Pt breaks out in cold sweat and reports increased nausea with movement, would benefit from decreased therapy schedule Estimated Length of Stay: 2.5 weeks OT Treatment/Interventions: Balance/vestibular training;Community reintegration;Discharge planning;DME/adaptive equipment instruction;Functional mobility training;Neuromuscular re-education;Patient/family education;Psychosocial support;Self Care/advanced ADL retraining;Therapeutic Activities;Therapeutic Exercise;UE/LE Strength taining/ROM;UE/LE Coordination activities OT Recommendation Follow Up Recommendations: Home health OT Equipment Recommended: Tub/shower bench;3 in 1 bedside comode  OT Evaluation Precautions/Restrictions  Precautions Precautions: Sternal;Fall Precaution Comments: DM; becomes clammy and diaphoretic with activity.  Fall to R and posterior General   Vital Signs Therapy Vitals Pulse Rate: 114  BP: 143/76 mmHg Patient Position, if appropriate: Sitting Oxygen Therapy SpO2: 89 % O2 Device: None (Room air) Pain Pain Assessment Pain Assessment: No/denies pain Pain Score: 0-No pain Home Living/Prior Functioning Home Living Lives With: Spouse Available Help at Discharge: Family;Friend(s);Available 24 hours/day (husband unable to provide physical (lifting) assistance) Type of Home: House Home Access: Stairs to enter [employer] of Steps: 3 at back door (primary), 1 step at front.  No rails Entrance Stairs-Rails: None Home Layout: One level Bathroom Shower/Tub:  Tub/shower unit;Curtain Biochemist, clinical: Standard Home Adaptive Equipment: None IADL History Homemaking Responsibilities: Yes Meal Prep Responsibility: Primary Laundry Responsibility: Primary Bill Paying/Finance Responsibility: Primary Prior Function Level of Independence: Independent with basic ADLs;Independent with homemaking with ambulation;Independent with gait;Independent with transfers Able to Take Stairs?: Yes Driving: Yes Vocation: Retired ADL ADL Grooming: Maximal assistance Where Assessed-Grooming: Sitting at sink Upper Body Bathing: Minimal assistance Where Assessed-Upper Body Bathing: Sitting at sink Lower Body Bathing: Dependent Where Assessed-Lower Body Bathing: Sitting at sink;Standing at sink Upper Body Dressing:  (no clothes on eval) Lower Body Dressing:  (no clothes on eval) Toilet Transfer: Maximal assistance Toilet Transfer Method: Stand pivot Vision/Perception  Vision - History Baseline Vision: Wears glasses only for reading Patient Visual Report: No change from baseline Vision - Assessment Eye Alignment: Within Functional Limits Vision Assessment: Vision tested Ocular Range of Motion: Within Functional Limits Perception Perception: Within Functional Limits Praxis Praxis: Intact  Cognition Overall Cognitive Status: Appears within functional limits for tasks assessed Arousal/Alertness: Awake/alert Orientation Level: Oriented X4 Attention: Alternating Safety/Judgment: Appears intact Sensation Sensation Light Touch: Impaired by gross assessment (intact in UE, tingling in RLE) Stereognosis: Not tested Hot/Cold: Appears Intact Proprioception: Appears Intact Coordination Gross Motor Movements are Fluid and Coordinated: Not tested Motor  Motor Motor: Hemiplegia;Abnormal postural alignment and control Motor - Skilled Clinical Observations: R weakness, R lateral and posterior lean/LOB in sitting and standing Mobility  Bed Mobility Bed Mobility:  Supine to Sit;Sit to Supine Supine to Sit: 1: +1 Total assist;HOB flat Supine to Sit Details (indicate cue type and reason): Patient unable to perform supine > sit from flat bed without rails secondary to weakness and sternal precautions.  Educated on all sternal precautions and handout given; educated on log roll  technique while hugging pillow; patient required total A to perform full roll to L side and for side > sit EOB.    Trunk/Postural Assessment  Cervical Assessment Cervical Assessment: Within Functional Limits Thoracic Assessment Thoracic Assessment: Within Functional Limits Lumbar Assessment Lumbar Assessment: Exceptions to [facility] (h/o lumbar surgery, increased lordosis) Postural Control Postural Control: Deficits on evaluation (R lateral and posterior lean and LOB)  Balance Static Sitting Balance Static Sitting - Balance Support: Feet supported Static Sitting - Level of Assistance: 5: Stand by assistance;4: Min assist Static Sitting - Comment/# of Minutes: Secondary to R lateral and posterior LOB Dynamic Sitting Balance Dynamic Sitting - Balance Support: Feet supported Dynamic Sitting - Level of Assistance: 3: Mod assist Static Standing Balance Static Standing - Balance Support: Left upper extremity supported Static Standing - Level of Assistance: 3: Mod assist Dynamic Standing Balance Dynamic Standing - Balance Support: Bilateral upper extremity supported Dynamic Standing - Level of Assistance: 1: +2 Total assist Extremity/Trunk Assessment RUE Assessment RUE Assessment: Exceptions to [facility] LUE Assessment LUE Assessment: Within Functional Limits  See FIM for current functional status Refer to Care Plan for Long Term Goals  Recommendations for other services: None  Discharge Criteria: Patient will be discharged from OT if patient refuses treatment 3 consecutive times without medical reason, if treatment goals not met, if there is a change in medical status, if patient makes  no progress towards goals or if patient is discharged from hospital.  The above assessment, treatment plan, treatment alternatives and goals were discussed and mutually agreed upon: by patient  Sim Boast 09/11/2012, 11:16 AM

## 2012-09-11 NOTE — Patient Care Conference (Signed)
Inpatient RehabilitationTeam Conference Note Date: 09/11/2012   Time: 11:30 AM    [name]      Medical Record Number: [id number]  Date of Birth: 1934/07/25 Sex: Female         Room/Bed: 4032/4032-01 Payor Info: Payor: Theme park manager MEDICARE  Plan: AARP MEDICARE COMPLETE  Product Type: *No Product type*     Admitting Diagnosis: LT FRONTAL PARIETAL CVA  Admit Date/Time:  09/10/2012  6:56 PM Admission Comments: No comment available   Primary Diagnosis:  Cerebral embolism with cerebral infarction Principal Problem: Cerebral embolism with cerebral infarction  Patient Active Problem List   Diagnosis Date Noted  . Cerebral embolism with cerebral infarction 09/07/2012  . Hemiplegia, unspecified, affecting dominant side 09/07/2012  . 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 Angina pectoris 08/26/2012  . Coronary artery disease 08/22/2012  . Throat pain 08/19/2012  . Shortness of breath 08/19/2012  . Hyperlipidemia 08/19/2012  . Diabetes 08/19/2012  . Hypertension 08/19/2012  . Breast cancer 08/19/2012  . GERD (gastroesophageal reflux disease) 08/19/2012    Expected Discharge Date: Expected Discharge Date: 09/27/12  Team Members Present:       Current Status/Progress Goal Weekly Team Focus  Medical     new eval        Bowel/Bladder     cont b&b        Swallow/Nutrition/ Hydration     na        ADL's     new eval        Mobility             Communication             Safety/Cognition/ Behavioral Observations            Pain             Skin                *See Interdisciplinary Assessment and Plan and progress notes for long and short-term goals  Barriers to Discharge:      Possible Resolutions to Barriers:       Discharge Planning/Teaching Needs:    Return home with husband who can provide supervision/min level.  Can't do heavy care     Team Discussion:  New Eval  Revisions to Treatment Plan:  NOne      Kesha Hurrell, [place] Rhyme 09/16/2012, 2:00 PM

## 2012-09-11 NOTE — Progress Notes (Signed)
Social Work Assessment and Plan Social Work Assessment and Plan  Patient Details  Name: [name] MRN: [id number] Date of Birth: 1934/05/12  Today's Date: 09/11/2012  Problem List:  Patient Active Problem List  Diagnosis  . Throat pain  . Shortness of breath  . Hyperlipidemia  . Diabetes  . Hypertension  . Breast cancer  . GERD (gastroesophageal reflux disease)  . Coronary artery disease  . Angina pectoris  . S/P CABG x 4  . Cerebral embolism with cerebral infarction  . Hemiplegia, unspecified, affecting dominant side   Past Medical History:  Past Medical History  Diagnosis Date  . Breast mass, right   . Vertigo   . Thyroid disease   . History of breast cancer     39 treatments of radiation. Negative chemo.  . Diabetes   . GERD (gastroesophageal reflux disease)   . Hyperlipemia   . History of seasonal allergies   . Arthritis   . Angina pectoris 08/26/2012  . Complication of anesthesia   . PONV (postoperative nausea and vomiting)   . Hypertension     sees Dr. Fulton Weiss  . Coronary artery disease 08/22/2012    sees Dr [name]  . Pneumonia     hx of  . Headache     migraines  . Cancer     breast cancer, right side  . Gastritis     hx of  . 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 Abdominal hysterectomy   . Ovarian cyst removal   . Back surgery   . Appendectomy   . Breast surgery   . Laparoscopic nissen fundoplication   . Coronary artery bypass graft 09/05/2012    Procedure: CORONARY ARTERY BYPASS GRAFTING (CABG);  Surgeon: Sarah Alberts, MD;  Location: [facility];  Service: Open Heart Surgery;  Laterality: N/A;  CABG x four, using left internal mammary artery and bilateral greater saphenous vein harvested endoscopically  . Intraoperative transesophageal echocardiogram 09/05/2012    Procedure: INTRAOPERATIVE  TRANSESOPHAGEAL ECHOCARDIOGRAM;  Surgeon: Sarah Alberts, MD;  Location: [facility];  Service: Open Heart Surgery;  Laterality: N/A;   Social History:  reports that she quit smoking about 23 years ago. Her smoking use included Cigarettes. She does not have any smokeless tobacco history on file. She reports that she does not drink alcohol or use illicit drugs.  Family / Support Systems Marital Status: Married Patient Roles: Spouse Spouse/Significant Other: [name]  [phone] Other Supports: Church members Anticipated Caregiver: Husband Ability/Limitations of Caregiver: Husband can assist nothing heavy.  Pt needs to be light min/supervision level Caregiver Availability: 24/7 Family Dynamics: Close couple who have always been there for one another.  They have no children but very good friends and church members who are supportive.  Social History Preferred language: English Religion: Unknown Cultural Background: None Education: High School Read: Yes Write: Yes Employment Status: Retired Freight forwarder Issues: No issues Guardian/Conservator: none-according to MD pt is capable of mkaing her own decisions   Abuse/Neglect Physical Abuse: Denies Verbal Abuse: Denies Sexual Abuse: Denies Exploitation of patient/patient's resources: Denies Self-Neglect: Denies  Emotional Status Pt's affect, behavior adn adjustment status: Pt is motivated to improve and feels she will, but needs to get her nausea under control.  Husband is here to observe in therapies and be supportive of  pt.  She seems willing to do what she can to become independent again. Recent Psychosocial Issues: other medical issues Pyschiatric History: No issues Substance Abuse History: Remote history of smoking quit years ago  Patient / Family Perceptions, Expectations & Goals Pt/Family understanding of illness & functional limitations: Pt and husband can expalin her surgery and stroke and deficits.  They are hopeful she  will do well here and regain her independence again.  She is so tired fromeverything and affects of the surgery. Premorbid pt/family roles/activities: Wife, Retiree, Church member, Health visitor, etc Anticipated changes in roles/activities/participation: resume Pt/family expectations/goals: Pt states: " I want to be able to get around on my own."  Husband reprots: " I hope she can do for herself and only need a little help."  " But I will do what I can for her."  [employer]: None Premorbid Home Care/DME Agencies: None Transportation available at discharge: Husband Resource referrals recommended: Support group (specify) ([facility] Support group)  Discharge Planning Living Arrangements: Spouse/significant other Support Systems: Spouse/significant other;Friends/neighbors;Church/faith community Type of Residence: Private residence Insurance Resources: Multimedia programmer (specify) Primary school teacher) Financial Resources: Radio broadcast assistant Screen Referred: No Living Expenses: Lives with family Money Management: Patient;Spouse Do you have any problems obtaining your medications?: No Home Management: Patient Patient/Family Preliminary Plans: Return home with husband assisting her if necessary.  Husband stays here and assists with her care if he can.  He seems very supportive of pt. Social Work Anticipated Follow Up Needs: HH/OP;Support Group  Clinical Impression Very pleasant couple who are willing to help one another.  Pt is severely deconditioned from surgery and stroke, she will need time to recover.  Husband is very supportive and stays here with her. Await team's evaluations  [name] 09/11/2012, 2:17 PM

## 2012-09-11 NOTE — Progress Notes (Signed)
Patient ID: [name], female   DOB: 1934-03-22, 78 y.o.   MRN: [id number] Subjective/Complaints: 78 y.o. right-handed female with history of coronary artery disease, hypertension, type 2 diabetes mellitus and remote tobacco abuse. Admitted 09/03/2012 with shortness of breath on exertion and tightness in her chest. Cardiac catheterization showed 70% stenosis of the left main coronary artery with severe three-vessel coronary artery disease with preserved left ventricular function. She underwent coronary artery bypass grafting x4 09/05/2012 by Dr. Roxy Manns. Postoperative day 2 with noted right-sided weakness. CT scan imaging showed subacute left medial frontal parietal infarct felt to be embolic secondary to CABG and old right frontal lobe lacunar infarct. Neurology consulted and recommended continuing aspirin therapy  Objective: Vital Signs: Blood pressure 125/79, pulse 92, temperature 97.5 F (36.4 C), temperature source Oral, resp. rate 19, weight 95.8 kg (211 lb 3.2 oz), SpO2 92.00%. No results found. Results for orders placed during the hospital encounter of 09/10/12 (from the past 72 hour(s))  GLUCOSE, CAPILLARY     Status: Abnormal   Collection Time   09/10/12 10:16 PM      Component Value Range Comment   Glucose-Capillary 119 (*) 70 - 99 mg/dL   CBC WITH DIFFERENTIAL     Status: Abnormal (Preliminary result)   Collection Time   09/11/12  5:50 AM      Component Value Range Comment   WBC 10.2  4.0 - 10.5 K/uL    RBC 4.08  3.87 - 5.11 MIL/uL    Hemoglobin 11.8 (*) 12.0 - 15.0 g/dL    HCT 35.1 (*) 36.0 - 46.0 %    MCV 86.0  78.0 - 100.0 fL    MCH 28.9  26.0 - 34.0 pg    MCHC 33.6  30.0 - 36.0 g/dL    RDW 14.0  11.5 - 15.5 %    Platelets 324  150 - 400 K/uL    Neutrophils Relative PENDING  43 - 77 %    Neutro Abs PENDING  1.7 - 7.7 K/uL    Band Neutrophils PENDING  0 - 10 %    Lymphocytes Relative PENDING  12 - 46 %    Lymphs Abs PENDING  0.7 - 4.0 K/uL    Monocytes Relative  PENDING  3 - 12 %    Monocytes Absolute PENDING  0.1 - 1.0 K/uL    Eosinophils Relative PENDING  0 - 5 %    Eosinophils Absolute PENDING  0.0 - 0.7 K/uL    Basophils Relative PENDING  0 - 1 %    Basophils Absolute PENDING  0.0 - 0.1 K/uL    WBC Morphology PENDING      RBC Morphology PENDING      Smear Review PENDING      nRBC PENDING  0 /100 WBC    Metamyelocytes Relative PENDING      Myelocytes PENDING      Promyelocytes Absolute PENDING      Blasts PENDING     COMPREHENSIVE METABOLIC PANEL     Status: Abnormal   Collection Time   09/11/12  5:50 AM      Component Value Range Comment   Sodium 134 (*) 135 - 145 mEq/L    Potassium 3.9  3.5 - 5.1 mEq/L    Chloride 95 (*) 96 - 112 mEq/L    CO2 29  19 - 32 mEq/L    Glucose, Bld 124 (*) 70 - 99 mg/dL    BUN 16  6 - 23 mg/dL  Creatinine, Ser 0.75  0.50 - 1.10 mg/dL    Calcium 8.9  8.4 - 10.5 mg/dL    Total Protein 7.3  6.0 - 8.3 g/dL    Albumin 2.7 (*) 3.5 - 5.2 g/dL    AST 23  0 - 37 U/L    ALT 14  0 - 35 U/L    Alkaline Phosphatase 101  39 - 117 U/L    Total Bilirubin 0.4  0.3 - 1.2 mg/dL    GFR calc non Af Amer 80 (*) >90 mL/min    GFR calc Af Amer >90  >90 mL/min     Constitutional: She is oriented to person, place, and time. She appears well-developed and well-nourished.  HENT:  Head: Normocephalic and atraumatic.  Wears upper and partial lower dentures.  Eyes: Pupils are equal, round, and reactive to light.  Neck: Normal range of motion. Neck supple.  Cardiovascular: Normal rate and regular rhythm.  Pulmonary/Chest: Effort normal and breath sounds normal. She has no wheezes. She has no rales.  Abdominal: Soft. Bowel sounds are normal.  Neurological: She is alert and oriented to person, place, and time.  Speech clear. Follows commands without difficulty. Flat affect. Right sided weakness UE>LE. RUE is 4/5. RLE 4/5. No gross sensory deficits. Cognitively she displays good insight and awareness.  Skin: Skin is warm and  dry.  Chest wall and R-shin incision clean and dry without erythema. Well-approximated , non tender  Assessment/Plan: 1. Functional deficits secondary to Cardioembolic infarct L medial fronto-parietal which require 3+ hours per day of interdisciplinary therapy in a comprehensive inpatient rehab setting. Physiatrist is providing close team supervision and 24 hour management of active medical problems listed below. Physiatrist and rehab team continue to assess barriers to discharge/monitor patient progress toward functional and medical goals. FIM:    FIM - Upper Body Dressing/Undressing Upper body dressing/undressing: 0: Activity did not occur FIM - Lower Body Dressing/Undressing Lower body dressing/undressing: 0: Activity did not occur  FIM - Toileting Toileting: 0: Activity did not occur  FIM - Air cabin crew Transfers: 0-Activity did not occur  FIM - IT sales professional Transfer: 0: Activity did not occur  FIM - Locomotion: Wheelchair Locomotion: Wheelchair: 0: Activity did not occur FIM - Locomotion: Ambulation Locomotion: Ambulation: 0: Activity did not occur  Comprehension Comprehension Mode: Auditory Comprehension: 5-Understands basic 90% of the time/requires cueing < 10% of the time  Expression Expression Mode: Verbal Expression: 5-Expresses basic 90% of the time/requires cueing < 10% of the time.  Social Interaction Social Interaction: 4-Interacts appropriately 75 - 89% of the time - Needs redirection for appropriate language or to initiate interaction.  Problem Solving Problem Solving: 5-Solves basic 90% of the time/requires cueing < 10% of the time  Memory Memory: 4-Recognizes or recalls 75 - 89% of the time/requires cueing 10 - 24% of the time  Medical Problem List and Plan:  1. DVT Prophylaxis/Anticoagulation: Pharmaceutical: Lovenox  2. Pain Management: oxycodone prn for pain.  3. Mood: Flat affect noted. Will monitor for now. LCSW to  follow up  4. Neuropsych: This patient IS capable of making decisions on his/her own behalf.  5. HTN: Monitor with bid checks. Continue catapres, metoprolol and lasix.  6. Breast cancer: Femara on hold.  7. Fluid overload: Check daily weights. Continue lasix bid.  8. DM type 2: Continue lantus 24 units bid. CBG checks AC/HS with SSI for BS control to help with wound healing. Continue to hold amaryl. Anticipated upward trend as appetite improves.  9. Constipation: reports poor appetite with nausea. SSE today. Augment bowel program   LOS (Days) 1 A FACE TO FACE EVALUATION WAS PERFORMED  KIRSTEINS,ANDREW E 09/11/2012, 7:41 AM

## 2012-09-12 ENCOUNTER — Inpatient Hospital Stay (HOSPITAL_COMMUNITY): Payer: Medicare Other | Admitting: *Deleted

## 2012-09-12 ENCOUNTER — Inpatient Hospital Stay (HOSPITAL_COMMUNITY): Payer: Medicare Other | Admitting: Occupational Therapy

## 2012-09-12 DIAGNOSIS — I6340 Cerebral infarction due to embolism of unspecified cerebral artery: Secondary | ICD-10-CM

## 2012-09-12 DIAGNOSIS — G8110 Spastic hemiplegia affecting unspecified side: Secondary | ICD-10-CM

## 2012-09-12 LAB — GLUCOSE, CAPILLARY
Glucose-Capillary: 132 mg/dL — ABNORMAL HIGH (ref 70–99)
Glucose-Capillary: 109 mg/dL — ABNORMAL HIGH (ref 70–99)
Glucose-Capillary: 112 mg/dL — ABNORMAL HIGH (ref 70–99)
Glucose-Capillary: 134 mg/dL — ABNORMAL HIGH (ref 70–99)

## 2012-09-12 NOTE — Progress Notes (Signed)
Physical Therapy Session Note  Patient Details  Name: [name] MRN: [id number] Date of Birth: 04/29/1934  Today's Date: 09/12/2012 Time: [id number] Time Calculation (min): 28 min  Short Term Goals: Week 1:  PT Short Term Goal 1 (Week 1): Patient will perform bed mobility on flat bed to L and R with mod A PT Short Term Goal 2 (Week 1): Patient will perform bed <> w/c transfers stand pivot with mod A with adherence to sternal precuations PT Short Term Goal 3 (Week 1): Patient will perform gait training with LRAD x 50' in controlled environment with min A and improved gait sequencing with decreased posterior LOB PT Short Term Goal 4 (Week 1): Patient will perform up and down 3 stairs with 2 rails and min A  Skilled Therapeutic Interventions/Progress Updates:    This afternoon's session focused on therapeutic activity with bed mobility, transfers, and gait training. Patient requested to complete therapy session in her room secondary to the gym being "too hot." See details of treatment session below.  Patient able to recall sternal precautions of not pushing/pulling. Patient educated about remainder of sternal precautions (no reaching above head, behind back, lifting, or using UEs to push when transferring). Patient verbalized understanding. Sternal precautions maintained throughout entire session. Patient's HR and O2 remained stable throughout session.  Patient left seated in recliner with all needs within reach and husband present. Patient encouraged to call for assistance.  Therapy Documentation Precautions:  Precautions Precautions: Sternal;Fall Precaution Comments: DM; becomes clammy and diaphoretic with activity.  Fall to R and posterior Restrictions Weight Bearing Restrictions: No Pain: Pain Assessment Pain Assessment: No/denies pain Mobility: Bed Mobility Bed Mobility: Supine to Sit Supine to Sit: HOB flat;2: Max assist Supine to Sit Details: Tactile cues for  initiation;Verbal cues for sequencing;Verbal cues for technique;Verbal cues for precautions/safety; Patient requires max assist secondary to not being able to use bedrails due to sternal precautions. Sitting - Scoot to Edge of Bed: 3: Mod assist Sitting - Scoot to Edge of Bed Details: Tactile cues for initiation;Tactile cues for weight shifting;Manual facilitation for weight shifting;Verbal cues for technique;Verbal cues for sequencing;Verbal cues for precautions/safety Transfers Sit to Stand: 3: Mod assist;Without upper extremity assist Sit to Stand Details: Tactile cues for initiation;Verbal cues for technique;Verbal cues for sequencing;Tactile cues for weight shifting;Manual facilitation for weight shifting;Verbal cues for precautions/safety Sit to Stand Details (indicate cue type and reason): Patient completed sit<>stands with arms crossed across chest to assist with maintaining sternal precautions. Stand to Sit: 3: Mod assist;Without upper extremity assist Stand to Sit Details (indicate cue type and reason): Tactile cues for weight shifting;Verbal cues for precautions/safety;Verbal cues for technique;Verbal cues for sequencing;Manual facilitation for weight shifting Stand to Sit Details: Patient completed sit<>stands with arms crossed across chest to assist with maintaining sternal precautions. Locomotion : Ambulation Ambulation: Yes Ambulation/Gait Assistance: 3: Mod assist Ambulation Distance (Feet): 13 Feet Assistive device: Rolling walker Ambulation/Gait Assistance Details: Verbal cues for precautions/safety;Verbal cues for gait pattern;Manual facilitation for weight shifting;Verbal cues for safe use of DME/AE;Verbal cues for technique;Verbal cues for sequencing;Tactile cues for weight shifting;Tactile cues for posture Ambulation/Gait Assistance Details: Patient instructed in gait training 13' x2  Gait Gait: Yes Gait Pattern: Impaired Gait Pattern: Wide base of support;Trunk  flexed;Lateral trunk lean to right;Step-to pattern;Decreased step length - right;Decreased stance time - right;Decreased stride length;Decreased weight shift to right Stairs / Additional Locomotion Stairs Assistance: Not tested (comment) Wheelchair Mobility Wheelchair Mobility: No   See FIM for current functional status  Therapy/Group: Individual Therapy  Lillia Abed. Elise Knobloch, PT, DPT  09/12/2012, 4:32 PM

## 2012-09-12 NOTE — Progress Notes (Signed)
Patient ID: [name], female   DOB: August 06, 1934, 78 y.o.   MRN: [id number] Subjective/Complaints: 78 y.o. right-handed female with history of coronary artery disease, hypertension, type 2 diabetes mellitus and remote tobacco abuse. Admitted 09/03/2012 with shortness of breath on exertion and tightness in her chest. Cardiac catheterization showed 70% stenosis of the left main coronary artery with severe three-vessel coronary artery disease with preserved left ventricular function. She underwent coronary artery bypass grafting x4 09/05/2012 by Dr. Roxy Manns. Postoperative day 2 with noted right-sided weakness. CT scan imaging showed subacute left medial frontal parietal infarct felt to be embolic secondary to CABG and old right frontal lobe lacunar infarct. Neurology consulted and recommended continuing aspirin therapy Therapy notes poor activity tolerance , gets clammy when she gets up Objective: Vital Signs: Blood pressure 119/62, pulse 89, temperature 97.2 F (36.2 C), temperature source Oral, resp. rate 18, weight 94.7 kg (208 lb 12.4 oz), SpO2 96.00%. No results found. Results for orders placed during the hospital encounter of 09/10/12 (from the past 72 hour(s))  GLUCOSE, CAPILLARY     Status: Abnormal   Collection Time   09/10/12 10:16 PM      Component Value Range Comment   Glucose-Capillary 119 (*) 70 - 99 mg/dL   CBC WITH DIFFERENTIAL     Status: Abnormal   Collection Time   09/11/12  5:50 AM      Component Value Range Comment   WBC 10.2  4.0 - 10.5 K/uL    RBC 4.08  3.87 - 5.11 MIL/uL    Hemoglobin 11.8 (*) 12.0 - 15.0 g/dL    HCT 35.1 (*) 36.0 - 46.0 %    MCV 86.0  78.0 - 100.0 fL    MCH 28.9  26.0 - 34.0 pg    MCHC 33.6  30.0 - 36.0 g/dL    RDW 14.0  11.5 - 15.5 %    Platelets 324  150 - 400 K/uL    Neutrophils Relative 68  43 - 77 %    Lymphocytes Relative 19  12 - 46 %    Monocytes Relative 7  3 - 12 %    Eosinophils Relative 5  0 - 5 %    Basophils Relative 1  0 - 1 %     Neutro Abs 7.0  1.7 - 7.7 K/uL    Lymphs Abs 1.9  0.7 - 4.0 K/uL    Monocytes Absolute 0.7  0.1 - 1.0 K/uL    Eosinophils Absolute 0.5  0.0 - 0.7 K/uL    Basophils Absolute 0.1  0.0 - 0.1 K/uL   COMPREHENSIVE METABOLIC PANEL     Status: Abnormal   Collection Time   09/11/12  5:50 AM      Component Value Range Comment   Sodium 134 (*) 135 - 145 mEq/L    Potassium 3.9  3.5 - 5.1 mEq/L    Chloride 95 (*) 96 - 112 mEq/L    CO2 29  19 - 32 mEq/L    Glucose, Bld 124 (*) 70 - 99 mg/dL    BUN 16  6 - 23 mg/dL    Creatinine, Ser 0.75  0.50 - 1.10 mg/dL    Calcium 8.9  8.4 - 10.5 mg/dL    Total Protein 7.3  6.0 - 8.3 g/dL    Albumin 2.7 (*) 3.5 - 5.2 g/dL    AST 23  0 - 37 U/L    ALT 14  0 - 35 U/L    Alkaline  Phosphatase 101  39 - 117 U/L    Total Bilirubin 0.4  0.3 - 1.2 mg/dL    GFR calc non Af Amer 80 (*) >90 mL/min    GFR calc Af Amer >90  >90 mL/min   GLUCOSE, CAPILLARY     Status: Abnormal   Collection Time   09/11/12  7:25 AM      Component Value Range Comment   Glucose-Capillary 130 (*) 70 - 99 mg/dL    Comment 1 Notify RN     GLUCOSE, CAPILLARY     Status: Abnormal   Collection Time   09/11/12  8:55 AM      Component Value Range Comment   Glucose-Capillary 160 (*) 70 - 99 mg/dL    Comment 1 Notify RN     GLUCOSE, CAPILLARY     Status: Abnormal   Collection Time   09/11/12 11:37 AM      Component Value Range Comment   Glucose-Capillary 118 (*) 70 - 99 mg/dL    Comment 1 Notify RN     GLUCOSE, CAPILLARY     Status: Abnormal   Collection Time   09/11/12  4:31 PM      Component Value Range Comment   Glucose-Capillary 150 (*) 70 - 99 mg/dL   GLUCOSE, CAPILLARY     Status: Abnormal   Collection Time   09/11/12  9:33 PM      Component Value Range Comment   Glucose-Capillary 106 (*) 70 - 99 mg/dL    Comment 1 Notify RN     GLUCOSE, CAPILLARY     Status: Abnormal   Collection Time   09/12/12  7:28 AM      Component Value Range Comment   Glucose-Capillary 132 (*) 70  - 99 mg/dL    Comment 1 Notify RN       Constitutional: She is oriented to person, place, and time. She appears well-developed and well-nourished.  HENT:  Head: Normocephalic and atraumatic.  Wears upper and partial lower dentures.  Eyes: Pupils are equal, round, and reactive to light.  Neck: Normal range of motion. Neck supple.  Cardiovascular: Normal rate and regular rhythm.  Pulmonary/Chest: Effort normal and breath sounds normal. She has no wheezes. She has no rales.  Abdominal: Soft. Bowel sounds are normal.  Neurological: She is alert and oriented to person, place, and time.  Speech clear. Follows commands without difficulty. Flat affect. Right sided weakness UE>LE. RUE is 4/5. RLE 4/5. No gross sensory deficits. Cognitively she displays good insight and awareness.  Skin: Skin is warm and dry.  Chest wall and R-shin incision clean and dry without erythema. Well-approximated , non tender  Assessment/Plan: 1. Functional deficits secondary to Cardioembolic infarct L medial fronto-parietal which require 3+ hours per day of interdisciplinary therapy in a comprehensive inpatient rehab setting. Physiatrist is providing close team supervision and 24 hour management of active medical problems listed below. Physiatrist and rehab team continue to assess barriers to discharge/monitor patient progress toward functional and medical goals. FIM: FIM - Bathing Bathing Steps Patient Completed: Chest;Right Arm;Left Arm;Abdomen Bathing: 2: Max-Patient completes 3-4 43f 10 parts or 25-49%  FIM - Upper Body Dressing/Undressing Upper body dressing/undressing: 0: Wears gown/pajamas-no public clothing FIM - Lower Body Dressing/Undressing Lower body dressing/undressing: 0: Wears gown/pajamas-no public clothing  FIM - Toileting Toileting: 0: No continent bowel/bladder events this shift  FIM - Air cabin crew Transfers: 0-Activity did not occur  FIM - IT sales professional Transfer: 2:  Chair or W/C >  Bed: Max A (lift and lower assist);2: Supine > Sit: Max A (lifting assist/Pt. 25-49%)  FIM - Locomotion: Wheelchair Distance: 150 total A secondary to sternal precautions Locomotion: Wheelchair: 1: Total Assistance/staff pushes wheelchair (Pt<25%) (secondary to sternal precautions) FIM - Locomotion: Ambulation Locomotion: Ambulation Assistive Devices: Other (comment) (bilat HHA) Ambulation/Gait Assistance: 1: +2 Total assist Locomotion: Ambulation: 1: Two helpers  Comprehension Comprehension Mode: Auditory Comprehension: 5-Follows basic conversation/direction: With extra time/assistive device  Expression Expression Mode: Verbal Expression: 5-Expresses basic needs/ideas: With extra time/assistive device  Social Interaction Social Interaction: 5-Interacts appropriately 90% of the time - Needs monitoring or encouragement for participation or interaction.  Problem Solving Problem Solving: 5-Solves basic 90% of the time/requires cueing < 10% of the time  Memory Memory: 5-Recognizes or recalls 90% of the time/requires cueing < 10% of the time  Medical Problem List and Plan:  1. DVT Prophylaxis/Anticoagulation: Pharmaceutical: Lovenox  2. Pain Management: oxycodone prn for pain.  3. Mood: Flat affect noted. Will monitor for now. LCSW to follow up  4. Neuropsych: This patient IS capable of making decisions on his/her own behalf.  5. HTN: Monitor with bid checks. Continue catapres, metoprolol and lasix.check for orthostasis  6. Breast cancer: Femara on hold.  7. Fluid overload: Check daily weights. Continue lasix bid.  8. DM type 2: Continue lantus 24 units bid. CBG checks AC/HS with SSI for BS control to help with wound healing. Continue to hold amaryl. Anticipated upward trend as appetite improves.  9. Constipation: reports poor appetite with nausea.. Augment bowel program   LOS (Days) 2 A FACE TO FACE EVALUATION WAS PERFORMED  Jaice Lague E 09/12/2012, 8:27 AM

## 2012-09-12 NOTE — Progress Notes (Signed)
Physical Therapy Session Note  Patient Details  Name: [name] MRN: [id number] Date of Birth: [birth year]  Today's Date: 09/12/2012 Time:  8:35-9:30 (40min)   Short Term Goals: Week 1:  PT Short Term Goal 1 (Week 1): Patient will perform bed mobility on flat bed to L and R with mod A PT Short Term Goal 2 (Week 1): Patient will perform bed <> w/c transfers stand pivot with mod A with adherence to sternal precuations PT Short Term Goal 3 (Week 1): Patient will perform gait training with LRAD x 50' in controlled environment with min A and improved gait sequencing with decreased posterior LOB PT Short Term Goal 4 (Week 1): Patient will perform up and down 3 stairs with 2 rails and min A  Skilled Therapeutic Interventions/Progress Updates:    [place] focused on increased activity tolerance, transfer training, and Berg balance assessment. Pt up in recliner this morning, stating this was first morning she'd sat to eat. Agreeable to therapy, but prefers to stay in room due to exertion required out of room.   Sit<>stand transfer from recliner and arm chair x4 for practice and strengthening with Mod A with cues for scooting and enough anterior translation for ease of transfer without UEs. Pt using pillow in arms.   Stand-step transfer with mod A x2 for balance due to posterior and R LOB.   Gait training 2x6' with Mod A and 1 HHA with 1 arm around trunk for steadying and shuffling gait.   See score for Berg below (20/56). Pt with most difficulty with transfers and tasks in decreased BOS. Pt educated on findings and implications.   Pt pleased with progress today, feels activity tolerance improving. Pt did require seated rest break for ~ 42min following each 1-84min of standing activity. HR increased up to 120's during standing tasks, and 02 dropped to 85% at one point, but rebounded in 1 min sitting breathing. Pt continues to have diaphoresis and "clammy skin" with exertion.   Therapy  Documentation Precautions:  Precautions Precautions: Sternal;Fall Precaution Comments: DM; becomes clammy and diaphoretic with activity.  Fall to R and posterior Restrictions Weight Bearing Restrictions: No   Vital Signs: Therapy Vitals Temp: 97.2 F (36.2 C) Temp src: Oral Pulse Rate: 107  Resp: 18  BP: 131/65 mmHg Patient Position, if appropriate: Lying Oxygen Therapy SpO2: 97 % O2 Device: None (Room air) Pain: Pain Assessment Pain Score:   1    Balance: Standardized Balance Assessment Standardized Balance Assessment: Berg Balance Test Berg Balance Test Sit to Stand: Needs moderate or maximal assist to stand Standing Unsupported: Able to stand 30 seconds unsupported Sitting with Back Unsupported but Feet Supported on Floor or Stool: Able to sit safely and securely 2 minutes Stand to Sit: Sits independently, has uncontrolled descent Transfers: Needs one person to assist Standing Unsupported with Eyes Closed: Able to stand 10 seconds with supervision Standing Ubsupported with Feet Together: Needs help to attain position and unable to hold for 15 seconds From Standing, Reach Forward with Outstretched Arm: Can reach forward >12 cm safely (5") From Standing Position, Pick up Object from Floor: Able to pick up shoe, needs supervision From Standing Position, Turn to Look Behind Over each Shoulder: Looks behind one side only/other side shows less weight shift Turn 360 Degrees: Needs assistance while turning Standing Unsupported, Alternately Place Feet on Step/Stool: Needs assistance to keep from falling or unable to try Standing Unsupported, One Foot in Front: Loses balance while stepping or standing Standing on One  Leg: Unable to try or needs assist to prevent fall Total Score: 20   See FIM for current functional status  Therapy/Group: Individual Therapy  Kennieth Rad, PT, DPT   09/12/2012, 9:26 AM

## 2012-09-12 NOTE — Progress Notes (Signed)
Occupational Therapy Session Note  Patient Details  Name: [name] MRN: [id number] Date of Birth: [birth year]  Today's Date: 09/12/2012 Time: 1030-1120 Time Calculation (min): 50 min  Short Term Goals: Week 1:  OT Short Term Goal 1 (Week 1): Pt will complete toilet transfer with mod assist OT Short Term Goal 2 (Week 1): Pt will complete bathing with mod assist OT Short Term Goal 3 (Week 1): Pt will complete complete UB dressing with mod assist OT Short Term Goal 4 (Week 1): Pt will complete LB dressing with max assist  Skilled Therapeutic Interventions/Progress Updates:    Pt seen for ADL retraining with focus on transfers and maintaining sternal precautions during self-care and mobility.  Pt received up in recliner, requesting to return to bed.  Pt willing to participate in therapy but completed UB bathing and requested to return to bed secondary to increased nausea and reports she had been sitting up too long.  Sit to stand from recliner with mod assist (lifting) with pt holding pillow to chest to maintain sternal precautions.  In bed engaged in log rolling Rt and Lt with use of legs and not arms secondary to sternal precautions to clean periarea as pt incontinent of urine.  RN informed of blood in brief and applied barrier cream to light tear near vagina.  Visitors arrived near end of session and pt requested to rest and visit with them = missing 10 mins of therapy.  HR and O2 intermittent monitoring with mobility.  HR 98 post stand pivot transfer and remained in mid-high 80s with bed mobility, O2 > 94%.  Therapy Documentation Precautions:  Precautions Precautions: Sternal;Fall Precaution Comments: DM; becomes clammy and diaphoretic with activity.  Fall to R and posterior Restrictions Weight Bearing Restrictions: No General: General Amount of Missed OT Time (min): 10 Minutes Vital Signs: Therapy Vitals Pulse Rate: 107  BP: 131/65 mmHg Oxygen Therapy SpO2: 97 % O2 Device:  None (Room air) Pain: Pain Assessment Pain Score:   1  See FIM for current functional status  Therapy/Group: Individual Therapy  Sim Boast 09/12/2012, 12:13 PM

## 2012-09-13 ENCOUNTER — Inpatient Hospital Stay (HOSPITAL_COMMUNITY): Payer: Medicare Other | Admitting: Physical Therapy

## 2012-09-13 ENCOUNTER — Inpatient Hospital Stay (HOSPITAL_COMMUNITY): Payer: Medicare Other | Admitting: Occupational Therapy

## 2012-09-13 LAB — GLUCOSE, CAPILLARY
Glucose-Capillary: 142 mg/dL — ABNORMAL HIGH (ref 70–99)
Glucose-Capillary: 144 mg/dL — ABNORMAL HIGH (ref 70–99)
Glucose-Capillary: 110 mg/dL — ABNORMAL HIGH (ref 70–99)
Glucose-Capillary: 119 mg/dL — ABNORMAL HIGH (ref 70–99)

## 2012-09-13 NOTE — Progress Notes (Signed)
Occupational Therapy Session Note  Patient Details  Name: [name] MRN: [id number] Date of Birth: 03/05/1934  Today's Date: 09/13/2012 Time: 0830-0930 Time Calculation (min): 60 min  Short Term Goals: Week 1:  OT Short Term Goal 1 (Week 1): Pt will complete toilet transfer with mod assist OT Short Term Goal 2 (Week 1): Pt will complete bathing with mod assist OT Short Term Goal 3 (Week 1): Pt will complete complete UB dressing with mod assist OT Short Term Goal 4 (Week 1): Pt will complete LB dressing with max assist  Skilled Therapeutic Interventions/Progress Updates:    Pt seen for ADL retraining with focus on bed mobility, transfer, sit <> stand, and self-care tasks of bathing and dressing all while maintaining sternal precautions.  Pt in bed upon arrival but willing to get OOB and bathe at sink.  Pt required max assist bed mobility secondary to inability to use UE to push/pull to sit EOB.  Pt completed bathing at sit <> stand level at sink with mod (lifting) assistance to come to standing, however this session demonstrated increased standing balance with occasional min assist and min cues to engage RLE.  Pt with increased tolerance of standing this session and no c/o SOB or nausea.  Pt ambulated 10 feet to recliner post bathing at sink and planned to attempt to stay OOB for increased endurance.  Therapy Documentation Precautions:  Precautions Precautions: Sternal;Fall Precaution Comments: DM; becomes clammy and diaphoretic with activity.  Fall to R and posterior Restrictions Weight Bearing Restrictions: No General:   Vital Signs: Therapy Vitals Pulse Rate: 112  BP: 126/52 mmHg Pain:  Pt with no c/o pain this session.  See FIM for current functional status  Therapy/Group: Individual Therapy  Sim Boast 09/13/2012, 10:17 AM

## 2012-09-13 NOTE — Progress Notes (Signed)
Physical Therapy Session Note  Patient Details  Name: [name] MRN: [id number] Date of Birth: 01-29-1934  Today's Date: 09/13/2012 Time: 1120-1200 and 1430-1500 Time Calculation (min): 40 min and 30 min  Short Term Goals: Week 1:  PT Short Term Goal 1 (Week 1): Patient will perform bed mobility on flat bed to L and R with mod A PT Short Term Goal 2 (Week 1): Patient will perform bed <> w/c transfers stand pivot with mod A with adherence to sternal precuations PT Short Term Goal 3 (Week 1): Patient will perform gait training with LRAD x 50' in controlled environment with min A and improved gait sequencing with decreased posterior LOB PT Short Term Goal 4 (Week 1): Patient will perform up and down 3 stairs with 2 rails and min A  Skilled Therapeutic Interventions/Progress Updates:   Patient sitting in recliner; feels she has been sitting up for too long.  Patient continues to request to stay in room secondary to nausea and lack of confidence and clammy/sweating episodes.  Requested that patient attempt therapy in smaller/private gym; patient agreed.  Performed sit > stand from recliner and multiple times from w/c with min-mod A without use of UE; performed transfers recliner > w/c with RW x 15' with min A with verbal cues to maintain safe distance to RW and for balance during stance on RLE.  In gym performed lateral stepping to L and R with RW and min-mod A with verbal cues for safe sequence with RW and for balance when stepping to R secondary to tendency for LOB R.  Performed sit > stand training and LE strength training with 5 reps sit> stand from elevated mat with no UE support and min A for balance; vitals assessed after 1st set. Lowered mat and attempted second set but patient reporting significant fatigue and nausea and requesting to return to room and return to bed.  Patient feels that she should rest in bed between therapies.  Discussed with patient the importance of rest but also  discussed the importance of spending increased time OOB in upright position for strengthening, PNA prevention and for energy conservation secondary to significant energy exertion for supine <> sit each time.  Patient states she would rest better in bed.  Returned to room and performed sit > stand and transfer to bed with RW x 15' and min-mod A.  Required max A for sit > supine and repositioning in bed with bridges.  Will attempt again this pm.  PM session: patient much more alert and able to tolerate therapy after prolonged rest break in bed.  Patient performed bed mobility with HOB up with max A to come side > sit.  Continued sit > stand training without use of UE support from elevated bed gradually lowering bed after 5 reps of sit > stand with min guard and verbal cues for full anterior lean and for upright posture once standing; performed 3-4 sets x 5 reps with rest breaks in between.  Also performed lateral stepping to R and L x 2 reps each with RW x 10' each direction for lateral weight shifts and lateral stabilization training on R with min A.  At end of session patient transferred to recliner to sit upright while visiting with friends until after supper.  Patient tolerated well with no episodes of diaphoresis or clamminess/nausea.    Therapy Documentation Precautions:  Precautions Precautions: Sternal;Fall Precaution Comments: DM; becomes clammy and diaphoretic with activity.  Fall to R and posterior Restrictions  Weight Bearing Restrictions: No General: Amount of Missed PT Time (min): 20 Minutes Missed Time Reason: Patient ill (comment) (nausea) Vital Signs: Therapy Vitals Pulse Rate: 95  BP: 126/52 mmHg Oxygen Therapy SpO2: 97 % O2 Device: None (Room air) Pain: Pain Assessment Pain Assessment: No/denies pain (reports signficant nausea) Locomotion : Ambulation Ambulation/Gait Assistance: 4: Min assist   See FIM for current functional status  Therapy/Group: Individual  Therapy  [name] [facility] 09/13/2012, 12:27 PM

## 2012-09-13 NOTE — Progress Notes (Signed)
Patient ID: [name], female   DOB: [birth year], 78 y.o.   MRN: [id number] Subjective/Complaints: 78 y.o. right-handed female with history of coronary artery disease, hypertension, type 2 diabetes mellitus and remote tobacco abuse. Admitted 09/03/2012 with shortness of breath on exertion and tightness in her chest. Cardiac catheterization showed 70% stenosis of the left main coronary artery with severe three-vessel coronary artery disease with preserved left ventricular function. She underwent coronary artery bypass grafting x4 09/05/2012 by Dr. Roxy Manns. Postoperative day 2 with noted right-sided weakness. CT scan imaging showed subacute left medial frontal parietal infarct felt to be embolic secondary to CABG and old right frontal lobe lacunar infarct. Neurology consulted and recommended continuing aspirin therapy Therapy notes poor activity tolerance , gets clammy when she gets up  Pt notes she was on a "pill" for breast CA hx, "why am I not on it?" Objective: Vital Signs: Blood pressure 110/73, pulse 83, temperature 97.4 F (36.3 C), temperature source Oral, resp. rate 18, weight 94.3 kg (207 lb 14.3 oz), SpO2 97.00%. No results found. Results for orders placed during the hospital encounter of 09/10/12 (from the past 72 hour(s))  GLUCOSE, CAPILLARY     Status: Abnormal   Collection Time   09/10/12 10:16 PM      Component Value Range Comment   Glucose-Capillary 119 (*) 70 - 99 mg/dL   CBC WITH DIFFERENTIAL     Status: Abnormal   Collection Time   09/11/12  5:50 AM      Component Value Range Comment   WBC 10.2  4.0 - 10.5 K/uL    RBC 4.08  3.87 - 5.11 MIL/uL    Hemoglobin 11.8 (*) 12.0 - 15.0 g/dL    HCT 35.1 (*) 36.0 - 46.0 %    MCV 86.0  78.0 - 100.0 fL    MCH 28.9  26.0 - 34.0 pg    MCHC 33.6  30.0 - 36.0 g/dL    RDW 14.0  11.5 - 15.5 %    Platelets 324  150 - 400 K/uL    Neutrophils Relative 68  43 - 77 %    Lymphocytes Relative 19  12 - 46 %    Monocytes Relative 7  3 - 12 %     Eosinophils Relative 5  0 - 5 %    Basophils Relative 1  0 - 1 %    Neutro Abs 7.0  1.7 - 7.7 K/uL    Lymphs Abs 1.9  0.7 - 4.0 K/uL    Monocytes Absolute 0.7  0.1 - 1.0 K/uL    Eosinophils Absolute 0.5  0.0 - 0.7 K/uL    Basophils Absolute 0.1  0.0 - 0.1 K/uL   COMPREHENSIVE METABOLIC PANEL     Status: Abnormal   Collection Time   09/11/12  5:50 AM      Component Value Range Comment   Sodium 134 (*) 135 - 145 mEq/L    Potassium 3.9  3.5 - 5.1 mEq/L    Chloride 95 (*) 96 - 112 mEq/L    CO2 29  19 - 32 mEq/L    Glucose, Bld 124 (*) 70 - 99 mg/dL    BUN 16  6 - 23 mg/dL    Creatinine, Ser 0.75  0.50 - 1.10 mg/dL    Calcium 8.9  8.4 - 10.5 mg/dL    Total Protein 7.3  6.0 - 8.3 g/dL    Albumin 2.7 (*) 3.5 - 5.2 g/dL    AST 23  0 - 37 U/L    ALT 14  0 - 35 U/L    Alkaline Phosphatase 101  39 - 117 U/L    Total Bilirubin 0.4  0.3 - 1.2 mg/dL    GFR calc non Af Amer 80 (*) >90 mL/min    GFR calc Af Amer >90  >90 mL/min   GLUCOSE, CAPILLARY     Status: Abnormal   Collection Time   09/11/12  7:25 AM      Component Value Range Comment   Glucose-Capillary 130 (*) 70 - 99 mg/dL    Comment 1 Notify RN     GLUCOSE, CAPILLARY     Status: Abnormal   Collection Time   09/11/12  8:55 AM      Component Value Range Comment   Glucose-Capillary 160 (*) 70 - 99 mg/dL    Comment 1 Notify RN     GLUCOSE, CAPILLARY     Status: Abnormal   Collection Time   09/11/12 11:37 AM      Component Value Range Comment   Glucose-Capillary 118 (*) 70 - 99 mg/dL    Comment 1 Notify RN     GLUCOSE, CAPILLARY     Status: Abnormal   Collection Time   09/11/12  4:31 PM      Component Value Range Comment   Glucose-Capillary 150 (*) 70 - 99 mg/dL   GLUCOSE, CAPILLARY     Status: Abnormal   Collection Time   09/11/12  9:33 PM      Component Value Range Comment   Glucose-Capillary 106 (*) 70 - 99 mg/dL    Comment 1 Notify RN     GLUCOSE, CAPILLARY     Status: Abnormal   Collection Time   09/12/12  7:28  AM      Component Value Range Comment   Glucose-Capillary 132 (*) 70 - 99 mg/dL    Comment 1 Notify RN     GLUCOSE, CAPILLARY     Status: Abnormal   Collection Time   09/12/12 11:38 AM      Component Value Range Comment   Glucose-Capillary 109 (*) 70 - 99 mg/dL    Comment 1 Notify RN     GLUCOSE, CAPILLARY     Status: Abnormal   Collection Time   09/12/12  4:41 PM      Component Value Range Comment   Glucose-Capillary 112 (*) 70 - 99 mg/dL   GLUCOSE, CAPILLARY     Status: Abnormal   Collection Time   09/12/12  9:00 PM      Component Value Range Comment   Glucose-Capillary 134 (*) 70 - 99 mg/dL    Comment 1 Notify RN     GLUCOSE, CAPILLARY     Status: Abnormal   Collection Time   09/13/12  7:19 AM      Component Value Range Comment   Glucose-Capillary 142 (*) 70 - 99 mg/dL     Constitutional: She is oriented to person, place, and time. She appears well-developed and well-nourished.  HENT:  Head: Normocephalic and atraumatic.  Wears upper and partial lower dentures.  Eyes: Pupils are equal, round, and reactive to light.  Neck: Normal range of motion. Neck supple.  Cardiovascular: Normal rate and regular rhythm.  Pulmonary/Chest: Effort normal and breath sounds normal. She has no wheezes. She has no rales.  Abdominal: Soft. Bowel sounds are normal.  Neurological: She is alert and oriented to person, place, and time.  Speech clear. Follows commands without difficulty.  Flat affect. Right sided weakness UE>LE. RUE is 4/5. RLE 4/5. No gross sensory deficits. Cognitively she displays good insight and awareness.  Skin: Skin is warm and dry.  Chest wall and R-shin incision clean and dry without erythema. Well-approximated , non tender  Assessment/Plan: 1. Functional deficits secondary to Cardioembolic infarct L medial fronto-parietal which require 3+ hours per day of interdisciplinary therapy in a comprehensive inpatient rehab setting. Physiatrist is providing close team supervision  and 24 hour management of active medical problems listed below. Physiatrist and rehab team continue to assess barriers to discharge/monitor patient progress toward functional and medical goals. FIM: FIM - Bathing Bathing Steps Patient Completed: Chest;Right Arm;Left Arm;Abdomen Bathing: 2: Max-Patient completes 3-4 89f 10 parts or 25-49%  FIM - Upper Body Dressing/Undressing Upper body dressing/undressing: 0: Wears gown/pajamas-no public clothing FIM - Lower Body Dressing/Undressing Lower body dressing/undressing: 0: Wears gown/pajamas-no public clothing  FIM - Toileting Toileting: 0: No continent bowel/bladder events this shift  FIM - Air cabin crew Transfers: 0-Activity did not occur  FIM - Control and instrumentation engineer Devices: Arm rests;Walker Bed/Chair Transfer: 2: Supine > Sit: Max A (lifting assist/Pt. 25-49%);3: Bed > Chair or W/C: Mod A (lift or lower assist);3: Chair or W/C > Bed: Mod A (lift or lower assist)  FIM - Locomotion: Wheelchair Distance: 150 total A secondary to sternal precautions Locomotion: Wheelchair: 0: Activity did not occur FIM - Locomotion: Ambulation Locomotion: Ambulation Assistive Devices: Administrator Ambulation/Gait Assistance: 3: Mod assist Locomotion: Ambulation: 1: Travels less than 50 ft with moderate assistance (Pt: 50 - 74%)  Comprehension Comprehension Mode: Auditory Comprehension: 6-Follows complex conversation/direction: With extra time/assistive device  Expression Expression Mode: Verbal Expression: 5-Expresses basic needs/ideas: With no assist  Social Interaction Social Interaction: 5-Interacts appropriately 90% of the time - Needs monitoring or encouragement for participation or interaction.  Problem Solving Problem Solving: 5-Solves basic 90% of the time/requires cueing < 10% of the time  Memory Memory: 5-Recognizes or recalls 90% of the time/requires cueing < 10% of the time  Medical Problem  List and Plan:  1. DVT Prophylaxis/Anticoagulation: Pharmaceutical: Lovenox  2. Pain Management: oxycodone prn for pain.  3. Mood: Flat affect noted. Will monitor for now. LCSW to follow up  4. Neuropsych: This patient IS capable of making decisions on his/her own behalf.  5. HTN: Monitor with bid checks. Continue catapres, metoprolol and lasix.check for orthostasis  6. Breast cancer: Femara on hold increases thromboembolism risk  7. Fluid overload: Check daily weights. Continue lasix bid.  8. DM type 2: Continue lantus 24 units bid. CBG checks AC/HS with SSI for BS control to help with wound healing. Continue to hold amaryl. Anticipated upward trend as appetite improves.  9. Constipation: reports poor appetite with nausea.. Augment bowel program   LOS (Days) 3 A FACE TO FACE EVALUATION WAS PERFORMED  KIRSTEINS,ANDREW E 09/13/2012, 7:55 AM

## 2012-09-14 ENCOUNTER — Inpatient Hospital Stay (HOSPITAL_COMMUNITY): Payer: Medicare Other | Admitting: Physical Therapy

## 2012-09-14 LAB — GLUCOSE, CAPILLARY
Glucose-Capillary: 131 mg/dL — ABNORMAL HIGH (ref 70–99)
Glucose-Capillary: 131 mg/dL — ABNORMAL HIGH (ref 70–99)
Glucose-Capillary: 137 mg/dL — ABNORMAL HIGH (ref 70–99)
Glucose-Capillary: 116 mg/dL — ABNORMAL HIGH (ref 70–99)

## 2012-09-14 NOTE — Progress Notes (Signed)
Physical Therapy Note  Patient Details  Name: ESBEYDI SOELLNER MRN: [id number] Date of Birth: 10-18-1933 Today's Date: 09/14/2012  1030-1110 (40 minutes) individual Pain: no complaint of pain Focus of treatment; therapeutic exercises focused on bilateral LE strengthening/activity tolerance; gait training /endurance. Treatment: Nustep Level 3 X 5 minutes LEs only; sit to stand without UE assist X 5 SBA for quad strengthening; gait RW 27 feet, 30 feet min to close SBA. Minimal diaphoresis noted.    Lulla Linville,JIM 09/14/2012, 7:42 AM

## 2012-09-14 NOTE — Progress Notes (Signed)
Patient ID: [name], female   DOB: May 02, 1934, 78 y.o.   MRN: [id number] Subjective/Complaints: 78 y.o. right-handed female with history of coronary artery disease, hypertension, type 2 diabetes mellitus and remote tobacco abuse. Admitted 09/03/2012 with shortness of breath on exertion and tightness in her chest. Cardiac catheterization showed 70% stenosis of the left main coronary artery with severe three-vessel coronary artery disease with preserved left ventricular function. She underwent coronary artery bypass grafting x4 09/05/2012 by Dr. Roxy Manns. Postoperative day 2 with noted right-sided weakness. CT scan imaging showed subacute left medial frontal parietal infarct felt to be embolic secondary to CABG and old right frontal lobe lacunar infarct. Neurology consulted and recommended continuing aspirin therapy Therapy notes poor activity tolerance , gets clammy when she gets up  Was in gym for PT yesterday Objective: Vital Signs: Blood pressure 122/74, pulse 81, temperature 97.5 F (36.4 C), temperature source Oral, resp. rate 18, weight 93.5 kg (206 lb 2.1 oz), SpO2 96.00%. No results found. Results for orders placed during the hospital encounter of 09/10/12 (from the past 72 hour(s))  GLUCOSE, CAPILLARY     Status: Abnormal   Collection Time   09/11/12 11:37 AM      Component Value Range Comment   Glucose-Capillary 118 (*) 70 - 99 mg/dL    Comment 1 Notify RN     GLUCOSE, CAPILLARY     Status: Abnormal   Collection Time   09/11/12  4:31 PM      Component Value Range Comment   Glucose-Capillary 150 (*) 70 - 99 mg/dL   GLUCOSE, CAPILLARY     Status: Abnormal   Collection Time   09/11/12  9:33 PM      Component Value Range Comment   Glucose-Capillary 106 (*) 70 - 99 mg/dL    Comment 1 Notify RN     GLUCOSE, CAPILLARY     Status: Abnormal   Collection Time   09/12/12  7:28 AM      Component Value Range Comment   Glucose-Capillary 132 (*) 70 - 99 mg/dL    Comment 1 Notify RN      GLUCOSE, CAPILLARY     Status: Abnormal   Collection Time   09/12/12 11:38 AM      Component Value Range Comment   Glucose-Capillary 109 (*) 70 - 99 mg/dL    Comment 1 Notify RN     GLUCOSE, CAPILLARY     Status: Abnormal   Collection Time   09/12/12  4:41 PM      Component Value Range Comment   Glucose-Capillary 112 (*) 70 - 99 mg/dL   GLUCOSE, CAPILLARY     Status: Abnormal   Collection Time   09/12/12  9:00 PM      Component Value Range Comment   Glucose-Capillary 134 (*) 70 - 99 mg/dL    Comment 1 Notify RN     GLUCOSE, CAPILLARY     Status: Abnormal   Collection Time   09/13/12  7:19 AM      Component Value Range Comment   Glucose-Capillary 142 (*) 70 - 99 mg/dL   GLUCOSE, CAPILLARY     Status: Abnormal   Collection Time   09/13/12 11:09 AM      Component Value Range Comment   Glucose-Capillary 144 (*) 70 - 99 mg/dL   GLUCOSE, CAPILLARY     Status: Abnormal   Collection Time   09/13/12  4:07 PM      Component Value Range Comment  Glucose-Capillary 110 (*) 70 - 99 mg/dL   GLUCOSE, CAPILLARY     Status: Abnormal   Collection Time   09/13/12  9:09 PM      Component Value Range Comment   Glucose-Capillary 119 (*) 70 - 99 mg/dL   GLUCOSE, CAPILLARY     Status: Abnormal   Collection Time   09/14/12  7:18 AM      Component Value Range Comment   Glucose-Capillary 131 (*) 70 - 99 mg/dL     Constitutional: She is oriented to person, place, and time. She appears well-developed and well-nourished.  HENT:  Head: Normocephalic and atraumatic.  Wears upper and partial lower dentures.  Eyes: Pupils are equal, round, and reactive to light.  Neck: Normal range of motion. Neck supple.  Cardiovascular: Normal rate and regular rhythm.  Pulmonary/Chest: Effort normal and breath sounds normal. She has no wheezes. She has no rales.  Abdominal: Soft. Bowel sounds are normal.  Neurological: She is alert and oriented to person, place, and time.  Speech clear. Follows commands  without difficulty. Flat affect. Right sided weakness UE>LE. RUE is 4-/5. RLE 4/5. No gross sensory deficits. Cognitively she displays good insight and awareness.  Skin: Skin is warm and dry.  Chest wall and R-shin incision clean and dry without erythema. Well-approximated , non tender  Assessment/Plan: 1. Functional deficits secondary to Cardioembolic infarct L medial fronto-parietal which require 3+ hours per day of interdisciplinary therapy in a comprehensive inpatient rehab setting. Physiatrist is providing close team supervision and 24 hour management of active medical problems listed below. Physiatrist and rehab team continue to assess barriers to discharge/monitor patient progress toward functional and medical goals. FIM: FIM - Bathing Bathing Steps Patient Completed: Chest;Right Arm;Left Arm;Abdomen;Front perineal area;Right upper leg;Left upper leg Bathing: 3: Mod-Patient completes 5-7 56f 10 parts or 50-74%  FIM - Upper Body Dressing/Undressing Upper body dressing/undressing steps patient completed: Thread/unthread right sleeve of pullover shirt/dresss;Thread/unthread left sleeve of pullover shirt/dress;Put head through opening of pull over shirt/dress;Pull shirt over trunk Upper body dressing/undressing: 4: Steadying assist FIM - Lower Body Dressing/Undressing Lower body dressing/undressing: 1: Total-Patient completed less than 25% of tasks  FIM - Toileting Toileting: 0: No continent bowel/bladder events this shift  FIM - Air cabin crew Transfers: 0-Activity did not occur  FIM - Control and instrumentation engineer Devices: Adult nurse Transfer: 2: Sit > Supine: Max A (lifting assist/Pt. 25-49%);3: Bed > Chair or W/C: Mod A (lift or lower assist);3: Chair or W/C > Bed: Mod A (lift or lower assist)  FIM - Locomotion: Wheelchair Distance: 150 total A secondary to sternal precautions Locomotion: Wheelchair: 1: Total Assistance/staff pushes wheelchair  (Pt<25%) FIM - Locomotion: Ambulation Locomotion: Ambulation Assistive Devices: Administrator Ambulation/Gait Assistance: 4: Min assist Locomotion: Ambulation: 1: Travels less than 50 ft with minimal assistance (Pt.>75%)  Comprehension Comprehension Mode: Auditory Comprehension: 6-Follows complex conversation/direction: With extra time/assistive device  Expression Expression Mode: Verbal Expression: 5-Expresses basic needs/ideas: With no assist  Social Interaction Social Interaction: 5-Interacts appropriately 90% of the time - Needs monitoring or encouragement for participation or interaction.  Problem Solving Problem Solving: 5-Solves complex 90% of the time/cues < 10% of the time  Memory Memory: 5-Recognizes or recalls 90% of the time/requires cueing < 10% of the time  Medical Problem List and Plan:  1. DVT Prophylaxis/Anticoagulation: Pharmaceutical: Lovenox  2. Pain Management: oxycodone prn for pain.  3. Mood: Flat affect noted. Will monitor for now. LCSW to follow up  4. Neuropsych: This  patient IS capable of making decisions on his/her own behalf.  5. HTN: Monitor with bid checks. Continue catapres, metoprolol and lasix.check for orthostasis  6. Breast cancer: Femara on hold increases thromboembolism risk  7. Fluid overload: Check daily weights. Continue lasix bid.  8. DM type 2: Continue lantus 24 units bid. CBG checks AC/HS with SSI for BS control to help with wound healing. Continue to hold amaryl. Anticipated upward trend as appetite improves.  9. Constipation: reports poor appetite with nausea.. Augment bowel program   LOS (Days) 4 A FACE TO FACE EVALUATION WAS PERFORMED  Elif Yonts E 09/14/2012, 9:30 AM

## 2012-09-15 ENCOUNTER — Inpatient Hospital Stay (HOSPITAL_COMMUNITY): Payer: Medicare Other | Admitting: Physical Therapy

## 2012-09-15 ENCOUNTER — Inpatient Hospital Stay (HOSPITAL_COMMUNITY): Payer: Medicare Other

## 2012-09-15 ENCOUNTER — Encounter (HOSPITAL_COMMUNITY): Payer: Medicare Other

## 2012-09-15 LAB — GLUCOSE, CAPILLARY
Glucose-Capillary: 139 mg/dL — ABNORMAL HIGH (ref 70–99)
Glucose-Capillary: 145 mg/dL — ABNORMAL HIGH (ref 70–99)
Glucose-Capillary: 99 mg/dL (ref 70–99)
Glucose-Capillary: 141 mg/dL — ABNORMAL HIGH (ref 70–99)

## 2012-09-15 NOTE — Progress Notes (Signed)
Occupational Therapy Note  Patient Details  Name: [name] MRN: [id number] Date of Birth: [birth year] Today's Date: 09/15/2012  Session 1 Pt missed 60 mins skilled OT services.  Pt declined ADL session secondary to request that a female OT assist her.  No female OT available.  Pt also stated that she felt "a little nauseous."  Session 2 Time: [id number] Pt denies pain Individual Therapy Pt stated that she felt better than earlier in the day.  Pt in bed upon arrival but agreeable to participating in therapy.  Pt engaged in sit<>stand, dynamic standing, stand pivot transfers, and review of sternal precausitons.  Pt required steady assist with stand pivot transfers and min A for sit->stand without use BUE to push or pull.  O2 sats>90% on RA; HR between 88 at rest and 110 after standing activity.  Pt somewhat clammy at end of session but stated that she felt okay.   [name] [facility] 09/15/2012, 1:31 PM

## 2012-09-15 NOTE — Progress Notes (Signed)
D/C'd sutures to 2 abdominal chest tube sites per MD order. Pt tolerated procedure well. Hortencia Conradi, RN

## 2012-09-15 NOTE — Progress Notes (Signed)
Physical Therapy Note  Patient Details  Name: [name] MRN: [id number] Date of Birth: 06/20/1934 Today's Date: 09/15/2012  [id number] (55 minutes) individual Pain: no complaint of pain Precautions: sternal Focus of treatment: Therapeutic exercises focused on bilateral LE strengthening/endurance; gait training/endurance including steps Treatment: pt in bed upon arrival; supine to sit with bedrail min assist; sit to stand without UE support SBA with multiple attempts; transfer stand/turn RW SBA; Nustep Level 3 X 8 minutes ( 12/14 5 minutes); gait 85 feet X 1 RW SBA; sit to stand X 5 (quad strengthening); up/down 3 (4") steps with bilateral rails SBA.   [id number] (30 minutes) individual Pain: no reported pain Precautions: sternal Focus of treatment: Therapeutic exercise focused on activity tolerance; gait training/endurance Treatment: transfers RW SBA ; Nustep Level 4 X 10 minutes LEs only with no rest break (see above) ; gait - 80 feet RW  X 1 SBA. No diaphoresis noted .   [name],JIM 09/15/2012, 7:26 AM

## 2012-09-15 NOTE — Progress Notes (Signed)
T[facility] BRIEF PROGRESS NOTE   Progress noted Surgical incisions healing nicely Appreciate care of PMR team  Rexene Alberts 09/15/2012 12:53 PM

## 2012-09-15 NOTE — Progress Notes (Signed)
Patient ID: [name], female   DOB: [birth year], 78 y.o.   MRN: [id number] Subjective/Complaints: 78 y.o. right-handed female with history of coronary artery disease, hypertension, type 2 diabetes mellitus and remote tobacco abuse. Admitted 09/03/2012 with shortness of breath on exertion and tightness in her chest. Cardiac catheterization showed 70% stenosis of the left main coronary artery with severe three-vessel coronary artery disease with preserved left ventricular function. She underwent coronary artery bypass grafting x4 09/05/2012 by Dr. Roxy Manns. Postoperative day 2 with noted right-sided weakness. CT scan imaging showed subacute left medial frontal parietal infarct felt to be embolic secondary to CABG and old right frontal lobe lacunar infarct. Neurology consulted and recommended continuing aspirin therapy Therapy notes poor activity tolerance , gets clammy when she gets up  No further diaphoretic episodes per PT Objective: Vital Signs: Blood pressure 110/73, pulse 108, temperature 97.5 F (36.4 C), temperature source Oral, resp. rate 18, weight 93.5 kg (206 lb 2.1 oz), SpO2 99.00%. No results found. Results for orders placed during the hospital encounter of 09/10/12 (from the past 72 hour(s))  GLUCOSE, CAPILLARY     Status: Abnormal   Collection Time   09/12/12 11:38 AM      Component Value Range Comment   Glucose-Capillary 109 (*) 70 - 99 mg/dL    Comment 1 Notify RN     GLUCOSE, CAPILLARY     Status: Abnormal   Collection Time   09/12/12  4:41 PM      Component Value Range Comment   Glucose-Capillary 112 (*) 70 - 99 mg/dL   GLUCOSE, CAPILLARY     Status: Abnormal   Collection Time   09/12/12  9:00 PM      Component Value Range Comment   Glucose-Capillary 134 (*) 70 - 99 mg/dL    Comment 1 Notify RN     GLUCOSE, CAPILLARY     Status: Abnormal   Collection Time   09/13/12  7:19 AM      Component Value Range Comment   Glucose-Capillary 142 (*) 70 - 99 mg/dL   GLUCOSE,  CAPILLARY     Status: Abnormal   Collection Time   09/13/12 11:09 AM      Component Value Range Comment   Glucose-Capillary 144 (*) 70 - 99 mg/dL   GLUCOSE, CAPILLARY     Status: Abnormal   Collection Time   09/13/12  4:07 PM      Component Value Range Comment   Glucose-Capillary 110 (*) 70 - 99 mg/dL   GLUCOSE, CAPILLARY     Status: Abnormal   Collection Time   09/13/12  9:09 PM      Component Value Range Comment   Glucose-Capillary 119 (*) 70 - 99 mg/dL   GLUCOSE, CAPILLARY     Status: Abnormal   Collection Time   09/14/12  7:18 AM      Component Value Range Comment   Glucose-Capillary 131 (*) 70 - 99 mg/dL   GLUCOSE, CAPILLARY     Status: Abnormal   Collection Time   09/14/12 11:38 AM      Component Value Range Comment   Glucose-Capillary 131 (*) 70 - 99 mg/dL   GLUCOSE, CAPILLARY     Status: Abnormal   Collection Time   09/14/12  4:58 PM      Component Value Range Comment   Glucose-Capillary 137 (*) 70 - 99 mg/dL   GLUCOSE, CAPILLARY     Status: Abnormal   Collection Time   09/14/12  8:28 PM  Component Value Range Comment   Glucose-Capillary 116 (*) 70 - 99 mg/dL    Comment 1 Notify RN     GLUCOSE, CAPILLARY     Status: Abnormal   Collection Time   09/15/12  7:32 AM      Component Value Range Comment   Glucose-Capillary 139 (*) 70 - 99 mg/dL     Constitutional: She is oriented to person, place, and time. She appears well-developed and well-nourished.  HENT:  Head: Normocephalic and atraumatic.  Wears upper and partial lower dentures.  Eyes: Pupils are equal, round, and reactive to light.  Neck: Normal range of motion. Neck supple.  Cardiovascular: Normal rate and regular rhythm.  Pulmonary/Chest: Effort normal and breath sounds normal. She has no wheezes. She has no rales.  Abdominal: Soft. Bowel sounds are normal.  Neurological: She is alert and oriented to person, place, and time.  Speech clear. Follows commands without difficulty. Flat affect. Right  sided weakness UE>LE. RUE is 4-/5. RLE 4/5. No gross sensory deficits. Cognitively she displays good insight and awareness.  Skin: Skin is warm and dry.  Chest wall and R-shin incision clean and dry without erythema. Well-approximated , non tender  Assessment/Plan: 1. Functional deficits secondary to Cardioembolic infarct L medial fronto-parietal which require 3+ hours per day of interdisciplinary therapy in a comprehensive inpatient rehab setting. Physiatrist is providing close team supervision and 24 hour management of active medical problems listed below. Physiatrist and rehab team continue to assess barriers to discharge/monitor patient progress toward functional and medical goals. FIM: FIM - Bathing Bathing Steps Patient Completed: Chest;Right Arm;Left Arm;Abdomen;Front perineal area;Right upper leg;Left upper leg Bathing: 3: Mod-Patient completes 5-7 57f 10 parts or 50-74%  FIM - Upper Body Dressing/Undressing Upper body dressing/undressing steps patient completed: Thread/unthread right sleeve of pullover shirt/dresss;Thread/unthread left sleeve of pullover shirt/dress;Put head through opening of pull over shirt/dress;Pull shirt over trunk Upper body dressing/undressing: 4: Steadying assist FIM - Lower Body Dressing/Undressing Lower body dressing/undressing: 1: Total-Patient completed less than 25% of tasks  FIM - Toileting Toileting: 0: No continent bowel/bladder events this shift  FIM - Air cabin crew Transfers: 0-Activity did not occur  FIM - Control and instrumentation engineer Devices: Copy: 2: Chair or W/C > Bed: Max A (lift and lower assist);2: Bed > Chair or W/C: Max A (lift and lower assist)  FIM - Locomotion: Wheelchair Distance: 150 total A secondary to sternal precautions Locomotion: Wheelchair: 1: Total Assistance/staff pushes wheelchair (Pt<25%) FIM - Locomotion: Ambulation Locomotion: Ambulation Assistive Devices: Astronomer Ambulation/Gait Assistance: 4: Min assist Locomotion: Ambulation: 1: Travels less than 50 ft with minimal assistance (Pt.>75%)  Comprehension Comprehension Mode: Auditory Comprehension: 6-Follows complex conversation/direction: With extra time/assistive device  Expression Expression Mode: Verbal Expression: 5-Expresses complex 90% of the time/cues < 10% of the time  Social Interaction Social Interaction: 5-Interacts appropriately 90% of the time - Needs monitoring or encouragement for participation or interaction.  Problem Solving Problem Solving: 5-Solves complex 90% of the time/cues < 10% of the time  Memory Memory: 5-Recognizes or recalls 90% of the time/requires cueing < 10% of the time  Medical Problem List and Plan:  1. DVT Prophylaxis/Anticoagulation: Pharmaceutical: Lovenox  2. Pain Management: oxycodone prn for pain.  3. Mood: Flat affect noted. Will monitor for now. LCSW to follow up  4. Neuropsych: This patient IS capable of making decisions on his/her own behalf.  5. HTN: Monitor with bid checks. Continue catapres, metoprolol and lasix.check for orthostasis  6.  Breast cancer: Femara on hold increases thromboembolism risk  7. Fluid overload: Check daily weights. Continue lasix bid.  8. DM type 2: Continue lantus 24 units bid. CBG checks AC/HS with SSI for BS control to help with wound healing. Continue to hold amaryl. Anticipated upward trend as appetite improves.  9. Constipation: reports poor appetite with nausea.. Augment bowel program   LOS (Days) 5 A FACE TO FACE EVALUATION WAS PERFORMED  KIRSTEINS,ANDREW E 09/15/2012, 9:36 AM

## 2012-09-16 ENCOUNTER — Inpatient Hospital Stay (HOSPITAL_COMMUNITY): Payer: Medicare Other | Admitting: Occupational Therapy

## 2012-09-16 ENCOUNTER — Inpatient Hospital Stay (HOSPITAL_COMMUNITY): Payer: Medicare Other | Admitting: Physical Therapy

## 2012-09-16 DIAGNOSIS — G8110 Spastic hemiplegia affecting unspecified side: Secondary | ICD-10-CM

## 2012-09-16 DIAGNOSIS — I6340 Cerebral infarction due to embolism of unspecified cerebral artery: Secondary | ICD-10-CM

## 2012-09-16 LAB — GLUCOSE, CAPILLARY
Glucose-Capillary: 102 mg/dL — ABNORMAL HIGH (ref 70–99)
Glucose-Capillary: 131 mg/dL — ABNORMAL HIGH (ref 70–99)
Glucose-Capillary: 77 mg/dL (ref 70–99)
Glucose-Capillary: 138 mg/dL — ABNORMAL HIGH (ref 70–99)

## 2012-09-16 MED ORDER — ZOLPIDEM TARTRATE 5 MG PO TABS
5.0000 mg | ORAL_TABLET | Freq: Every evening | ORAL | Status: DC | PRN
  Administered 2012-09-16 – 2012-09-22 (×7): 5 mg via ORAL
  Filled 2012-09-16 (×7): qty 1

## 2012-09-16 MED ORDER — OXYCODONE HCL 5 MG PO TABS
5.0000 mg | ORAL_TABLET | ORAL | Status: DC | PRN
  Administered 2012-09-16 – 2012-09-23 (×12): 5 mg via ORAL
  Filled 2012-09-16 (×6): qty 1
  Filled 2012-09-16: qty 5
  Filled 2012-09-16 (×6): qty 1

## 2012-09-16 MED ORDER — TEMAZEPAM 7.5 MG PO CAPS: 7.5000 mg | ORAL_CAPSULE | Freq: Every evening | ORAL | Status: DC | PRN

## 2012-09-16 NOTE — Progress Notes (Signed)
Patient ID: [name], female   DOB: 11-11-1933, 78 y.o.   MRN: [id number] Subjective/Complaints: 78 y.o. right-handed female with history of coronary artery disease, hypertension, type 2 diabetes mellitus and remote tobacco abuse. Admitted 09/03/2012 with shortness of breath on exertion and tightness in her chest. Cardiac catheterization showed 70% stenosis of the left main coronary artery with severe three-vessel coronary artery disease with preserved left ventricular function. She underwent coronary artery bypass grafting x4 09/05/2012 by Dr. Roxy Manns. Postoperative day 2 with noted right-sided weakness. CT scan imaging showed subacute left medial frontal parietal infarct felt to be embolic secondary to CABG and old right frontal lobe lacunar infarct. Neurology consulted and recommended continuing aspirin therapy  Therapy notes poor activity tolerance , reduced intensity to 15 hours per week Poor sleep, pain better, remainder of staples removed No further diaphoretic episodes per PT Objective: Vital Signs: Blood pressure 122/74, pulse 100, temperature 97.5 F (36.4 C), temperature source Oral, resp. rate 20, weight 93.5 kg (206 lb 2.1 oz), SpO2 98.00%. No results found. Results for orders placed during the hospital encounter of 09/10/12 (from the past 72 hour(s))  GLUCOSE, CAPILLARY     Status: Abnormal   Collection Time   09/13/12 11:09 AM      Component Value Range Comment   Glucose-Capillary 144 (*) 70 - 99 mg/dL   GLUCOSE, CAPILLARY     Status: Abnormal   Collection Time   09/13/12  4:07 PM      Component Value Range Comment   Glucose-Capillary 110 (*) 70 - 99 mg/dL   GLUCOSE, CAPILLARY     Status: Abnormal   Collection Time   09/13/12  9:09 PM      Component Value Range Comment   Glucose-Capillary 119 (*) 70 - 99 mg/dL   GLUCOSE, CAPILLARY     Status: Abnormal   Collection Time   09/14/12  7:18 AM      Component Value Range Comment   Glucose-Capillary 131 (*) 70 - 99 mg/dL    GLUCOSE, CAPILLARY     Status: Abnormal   Collection Time   09/14/12 11:38 AM      Component Value Range Comment   Glucose-Capillary 131 (*) 70 - 99 mg/dL   GLUCOSE, CAPILLARY     Status: Abnormal   Collection Time   09/14/12  4:58 PM      Component Value Range Comment   Glucose-Capillary 137 (*) 70 - 99 mg/dL   GLUCOSE, CAPILLARY     Status: Abnormal   Collection Time   09/14/12  8:28 PM      Component Value Range Comment   Glucose-Capillary 116 (*) 70 - 99 mg/dL    Comment 1 Notify RN     GLUCOSE, CAPILLARY     Status: Abnormal   Collection Time   09/15/12  7:32 AM      Component Value Range Comment   Glucose-Capillary 139 (*) 70 - 99 mg/dL   GLUCOSE, CAPILLARY     Status: Abnormal   Collection Time   09/15/12 12:03 PM      Component Value Range Comment   Glucose-Capillary 145 (*) 70 - 99 mg/dL   GLUCOSE, CAPILLARY     Status: Normal   Collection Time   09/15/12  4:59 PM      Component Value Range Comment   Glucose-Capillary 99  70 - 99 mg/dL   GLUCOSE, CAPILLARY     Status: Abnormal   Collection Time   09/15/12  9:07 PM  Component Value Range Comment   Glucose-Capillary 141 (*) 70 - 99 mg/dL    Comment 1 Notify RN     GLUCOSE, CAPILLARY     Status: Abnormal   Collection Time   09/16/12  7:17 AM      Component Value Range Comment   Glucose-Capillary 102 (*) 70 - 99 mg/dL    Comment 1 Notify RN       Constitutional: She is oriented to person, place, and time. She appears well-developed and well-nourished.  HENT:  Head: Normocephalic and atraumatic.  Wears upper and partial lower dentures.  Eyes: Pupils are equal, round, and reactive to light.  Neck: Normal range of motion. Neck supple.  Cardiovascular: Normal rate and regular rhythm.  Pulmonary/Chest: Effort normal and breath sounds normal. She has no wheezes. She has no rales.  Abdominal: Soft. Bowel sounds are normal.  Neurological: She is alert and oriented to person, place, and time.  Speech clear.  Follows commands without difficulty. Flat affect. Right sided weakness UE>LE. RUE is 4-/5. RLE 4/5. No gross sensory deficits. Cognitively she displays good insight and awareness.  Skin: Skin is warm and dry.  Chest wall and R-shin incision clean and dry without erythema. Well-approximated , non tender  Assessment/Plan: 1. Functional deficits secondary to Cardioembolic infarct L medial fronto-parietal which require 3+ hours per day of interdisciplinary therapy in a comprehensive inpatient rehab setting. Physiatrist is providing close team supervision and 24 hour management of active medical problems listed below. Physiatrist and rehab team continue to assess barriers to discharge/monitor patient progress toward functional and medical goals. FIM: FIM - Bathing Bathing Steps Patient Completed: Chest;Right Arm;Left Arm;Abdomen;Front perineal area;Right upper leg;Left upper leg Bathing: 3: Mod-Patient completes 5-7 61f 10 parts or 50-74%  FIM - Upper Body Dressing/Undressing Upper body dressing/undressing steps patient completed: Thread/unthread right sleeve of pullover shirt/dresss;Thread/unthread left sleeve of pullover shirt/dress;Put head through opening of pull over shirt/dress;Pull shirt over trunk Upper body dressing/undressing: 4: Steadying assist FIM - Lower Body Dressing/Undressing Lower body dressing/undressing: 1: Total-Patient completed less than 25% of tasks  FIM - Toileting Toileting steps completed by patient: Adjust clothing prior to toileting;Adjust clothing after toileting Toileting: 3: Mod-Patient completed 2 of 3 steps  FIM - Radio producer Devices: Mining engineer Transfers: 0-Activity did not occur  FIM - Control and instrumentation engineer Devices: Bed rails;Walker Bed/Chair Transfer: 4: Supine > Sit: Min A (steadying Pt. > 75%/lift 1 leg);5: Bed > Chair or W/C: Supervision (verbal cues/safety issues);5: Chair or W/C >  Bed: Supervision (verbal cues/safety issues)  FIM - Locomotion: Wheelchair Distance: 150 total A secondary to sternal precautions Locomotion: Wheelchair: 1: Total Assistance/staff pushes wheelchair (Pt<25%) FIM - Locomotion: Ambulation Locomotion: Ambulation Assistive Devices: Administrator Ambulation/Gait Assistance: 5: Supervision Locomotion: Ambulation: 2: Travels 50 - 149 ft with supervision/safety issues  Comprehension Comprehension Mode: Auditory Comprehension: 6-Follows complex conversation/direction: With extra time/assistive device  Expression Expression Mode: Verbal Expression: 5-Expresses complex 90% of the time/cues < 10% of the time  Social Interaction Social Interaction: 5-Interacts appropriately 90% of the time - Needs monitoring or encouragement for participation or interaction.  Problem Solving Problem Solving: 5-Solves complex 90% of the time/cues < 10% of the time  Memory Memory: 5-Recognizes or recalls 90% of the time/requires cueing < 10% of the time  Medical Problem List and Plan:  1. DVT Prophylaxis/Anticoagulation: Pharmaceutical: Lovenox  2. Pain Management: oxycodone prn for pain. Reduce to 5mg  3. Mood: Flat affect noted. Will monitor  for now. LCSW to follow up  4. Neuropsych: This patient IS capable of making decisions on his/her own behalf.  5. HTN: Monitor with bid checks. Continue catapres, metoprolol and lasix.check for orthostasis  6. Breast cancer: Femara on hold increases thromboembolism risk  7. Fluid overload: Check daily weights. Continue lasix bid.  8. DM type 2: Continue lantus 24 units bid. CBG checks AC/HS with SSI for BS control to help with wound healing. Continue to hold amaryl. Anticipated upward trend as appetite improves.  9. Constipation: reports poor appetite with nausea.. Augment bowel program 10.  Insomnia restoril  LOS (Days) 6 A FACE TO FACE EVALUATION WAS PERFORMED  Yomar Mejorado E 09/16/2012, 8:08 AM

## 2012-09-16 NOTE — Progress Notes (Signed)
Occupational Therapy Session Note  Patient Details  Name: [name] MRN: [id number] Date of Birth: 1934/01/02  Today's Date: 09/16/2012 Time: 0830-0930 Time Calculation (min): 60 min  Short Term Goals: Week 1:  OT Short Term Goal 1 (Week 1): Pt will complete toilet transfer with mod assist OT Short Term Goal 2 (Week 1): Pt will complete bathing with mod assist OT Short Term Goal 3 (Week 1): Pt will complete complete UB dressing with mod assist OT Short Term Goal 4 (Week 1): Pt will complete LB dressing with max assist  Skilled Therapeutic Interventions/Progress Updates:    Pt seen for ADL retraining with focus on bed mobility, sit <> stand, dynamic standing balance, and self-care tasks of bathing and dressing all while maintaining sternal precautions.  Pt demonstrated increased safety and independence with bed mobility with min assist supine to sit with use of elbow to push up into sitting.  Pt completed sit to stand without physical assistance and without use of UE.  Pt continues to require physical assistance with LB bathing and periarea secondary to decreased ability to reach, plan to introduce long handled sponge to assist.  Engaged in tub/shower transfer with use of tub bench.  Pt close supervision and min cues for transfer technique.  Pt reports sister has a shower chair that she could borrow, discussed difference between chair and tub bench and safety reasons for recommending tub bench at this time.  Plan to attempt stepping over ledge at later session.  Therapy Documentation Precautions:  Precautions Precautions: Sternal;Fall Precaution Comments: DM; becomes clammy and diaphoretic with activity.  Fall to R and posterior Restrictions Weight Bearing Restrictions: No Pain: Pain Assessment Pain Assessment: No/denies pain Pain Score: 0-No pain  See FIM for current functional status  Therapy/Group: Individual Therapy  Sim Boast 09/16/2012, 10:58 AM

## 2012-09-16 NOTE — Progress Notes (Signed)
Social Work Patient ID: [name], female   DOB: Dec 01, 1933, 78 y.o.   MRN: [id number] Met with pt who reports doing better, she hopes to go home before Christmas.  She is working hard to get there. Audra-PT reports she had a good day and this is a possibility.  Pt looks brighter and reports feeling better.  Conference on Wednesday.

## 2012-09-16 NOTE — Progress Notes (Signed)
Physical Therapy Session Note  Patient Details  Name: MINTIE SHANKER MRN: [id number] Date of Birth: 1933/11/06  Today's Date: 09/16/2012 Time: 1100-1153 and [id number] Time Calculation (min): 53 min and 32 min  Short Term Goals: Week 1:  PT Short Term Goal 1 (Week 1): Patient will perform bed mobility on flat bed to L and R with mod A PT Short Term Goal 2 (Week 1): Patient will perform bed <> w/c transfers stand pivot with mod A with adherence to sternal precuations PT Short Term Goal 3 (Week 1): Patient will perform gait training with LRAD x 50' in controlled environment with min A and improved gait sequencing with decreased posterior LOB PT Short Term Goal 4 (Week 1): Patient will perform up and down 3 stairs with 2 rails and min A  Skilled Therapeutic Interventions/Progress Updates:   Patient feeling much better today, no nausea and no episodes of diaphoresis. Patient performed sit > stand and transfer recliner <> w/c with RW x 15' in room with supervision with safe negotiation of RW in tight/narrow spaces around furniture.  Vitals assessed in gym; see below.  Patient reports that she is having a friend add rails to front and back entrance and adding a small step to front to minimize height of total step to front porch.  Reviewed stair negotiation with patient beginning with step to sequence up and down 3 stairs with 2 rails with min A and verbal cues for safe sequence.  Changed to alternating sequence up and down 3 stairs with two rails with min A but no LOB or reports of fatigue.  Performed LE strengthening with one set x 10 reps each RLE forward, lateral and retro step ups 4" step with UE support for balance only with sitting rest breaks in between to monitor HR and Sp02.  Performed dynamic standing balance with R and L lateral stepping x 15' with bilat HHA with verbal cues for full step length and weight shift; still with increased difficulty with full R lateral weight shift and minor LOB to  R.  Patient very fatigued and requesting to return to room; transferred to recliner for lunch supervision.  PM session: patient performed gait x 150' x 2 reps with RW and min A with intermittent standing rest breaks with focus on pursed lip breathing to maximize ventilation and perfusion.  Performed bilat LE strengthening and endurance training on Nustep at level 4 x 10 minutes with LE only secondary to sternal precautions with intermittent rest breaks to assess HR and Sp02.  Patient tolerated well but requested to return to bed to rest before supper.  Performed sit > supine on flat bed with min A.  Therapy Documentation Precautions:  Precautions Precautions: Sternal;Fall Precaution Comments: DM; becomes clammy and diaphoretic with activity.  Fall to R and posterior Restrictions Weight Bearing Restrictions: No Vital Signs: Therapy Vitals Pulse Rate: 90  BP: 140/80 mmHg Patient Position, if appropriate: Sitting Oxygen Therapy SpO2: 97 % O2 Device: None (Room air) Pulse Oximetry Type: Intermittent Pain: Pain Assessment Pain Assessment: No/denies pain Pain Score: 0-No pain Locomotion : Ambulation Ambulation/Gait Assistance: 5: Supervision   See FIM for current functional status  Therapy/Group: Individual Therapy  [name] [facility] 09/16/2012, 12:08 PM

## 2012-09-17 ENCOUNTER — Inpatient Hospital Stay (HOSPITAL_COMMUNITY): Payer: Medicare Other | Admitting: Occupational Therapy

## 2012-09-17 ENCOUNTER — Inpatient Hospital Stay (HOSPITAL_COMMUNITY): Payer: Medicare Other | Admitting: Physical Therapy

## 2012-09-17 LAB — GLUCOSE, CAPILLARY
Glucose-Capillary: 129 mg/dL — ABNORMAL HIGH (ref 70–99)
Glucose-Capillary: 123 mg/dL — ABNORMAL HIGH (ref 70–99)
Glucose-Capillary: 124 mg/dL — ABNORMAL HIGH (ref 70–99)
Glucose-Capillary: 129 mg/dL — ABNORMAL HIGH (ref 70–99)

## 2012-09-17 LAB — CREATININE, SERUM
Creatinine, Ser: 0.93 mg/dL (ref 0.50–1.10)
GFR calc Af Amer: 67 mL/min — ABNORMAL LOW (ref >90)
GFR calc non Af Amer: 58 mL/min — ABNORMAL LOW (ref >90)

## 2012-09-17 MED ORDER — GLIMEPIRIDE 4 MG PO TABS: 4.0000 mg | ORAL_TABLET | Freq: Every day | ORAL | Status: DC

## 2012-09-17 MED ORDER — GABAPENTIN 100 MG PO CAPS
100.0000 mg | ORAL_CAPSULE | Freq: Every day | ORAL | Status: DC
  Administered 2012-09-17 – 2012-09-22 (×6): 100 mg via ORAL
  Filled 2012-09-17 (×7): qty 1

## 2012-09-17 MED ORDER — CLONIDINE HCL 0.1 MG/24HR TD PTWK
0.1000 mg | MEDICATED_PATCH | TRANSDERMAL | Status: DC
  Administered 2012-09-17: 0.1 mg via TRANSDERMAL
  Filled 2012-09-17: qty 1

## 2012-09-17 MED ORDER — GLIMEPIRIDE 4 MG PO TABS
4.0000 mg | ORAL_TABLET | Freq: Every day | ORAL | Status: DC
  Administered 2012-09-17 – 2012-09-23 (×7): 4 mg via ORAL
  Filled 2012-09-17 (×8): qty 1

## 2012-09-17 NOTE — Progress Notes (Signed)
Following issues discussed:  1. Diabetes: Patient does not want to go home on insulin.  Discussed that elevated BS likely due to surgery, CVA, immobility etc and that we'll try to get her back on oral meds but doubt that she'll be off lantus by discharge.  Patient currently on bid lantus. Was taking Amaryl 2mg  daily. Hgb A1C at 6.8. Resumed amaryl at 4 mg daily. Will continue lantus bid for a few more days then attempt to taper to daily as Amaryl gets to steady state. Patient educated that she might need insulin at least once a day.  2.  Peripheral edema: Compression and elevation advised but she does not like/want TEDs. Will add low salt restrictions.

## 2012-09-17 NOTE — Progress Notes (Signed)
Brief Nutrition Note  RD reviewed chart.  BMI:  34.7.   Pt meets criteria for obesity grade 1 based on current BMI.  Wt Readings from Last 10 Encounters:  09/17/12 209 lb 14.1 oz (95.2 kg)  09/10/12 214 lb 12.8 oz (97.433 kg)  09/10/12 214 lb 12.8 oz (97.433 kg)  09/03/12 218 lb (98.884 kg)  08/26/12 220 lb (99.791 kg)  08/19/12 220 lb 12 oz (100.132 kg)   Weight loss as above.  Current diet order is CHO MOD MED, patient is consuming approximately 75-100% of meals at this time. Labs noted.    Patient states that she has not been dx with diabetes in the past.  Follows a healthy diet at home.  Monitor for appropriateness of further diet education.   Monitor intake and weight trend.  Antonieta Iba, RD, LDN Clinical Inpatient Dietitian Pager:  [phone] Weekend and after hours pager:  [phone]

## 2012-09-17 NOTE — Plan of Care (Signed)
Problem: RH BLADDER ELIMINATION Goal: RH STG MANAGE BLADDER WITH ASSISTANCE STG Manage Bladder With min . Assistance  Outcome: Not Progressing Patient has urgency and frequency with urinary incontinence. A. [name],LPN

## 2012-09-17 NOTE — Progress Notes (Addendum)
Patient ID: [name], female   DOB: 1934/10/01, 78 y.o.   MRN: [id number] Subjective/Complaints: 78 y.o. right-handed female with history of coronary artery disease, hypertension, type 2 diabetes mellitus and remote tobacco abuse. Admitted 09/03/2012 with shortness of breath on exertion and tightness in her chest. Cardiac catheterization showed 70% stenosis of the left main coronary artery with severe three-vessel coronary artery disease with preserved left ventricular function. She underwent coronary artery bypass grafting x4 09/05/2012 by Dr. Roxy Manns. Postoperative day 2 with noted right-sided weakness. CT scan imaging showed subacute left medial frontal parietal infarct felt to be embolic secondary to CABG and old right frontal lobe lacunar infarct. Neurology consulted and recommended continuing aspirin therapy  Therapy notes poor activity tolerance , reduced intensity to 15 hours per week R hand pain during the night , hx thumb arthritis but no prior generalized hand pain.  No recent trauma Objective: Vital Signs: Blood pressure 94/61, pulse 98, temperature 97.4 F (36.3 C), temperature source Oral, resp. rate 19, weight 95.2 kg (209 lb 14.1 oz), SpO2 95.00%. No results found. Results for orders placed during the hospital encounter of 09/10/12 (from the past 72 hour(s))  GLUCOSE, CAPILLARY     Status: Abnormal   Collection Time   09/14/12 11:38 AM      Component Value Range Comment   Glucose-Capillary 131 (*) 70 - 99 mg/dL   GLUCOSE, CAPILLARY     Status: Abnormal   Collection Time   09/14/12  4:58 PM      Component Value Range Comment   Glucose-Capillary 137 (*) 70 - 99 mg/dL   GLUCOSE, CAPILLARY     Status: Abnormal   Collection Time   09/14/12  8:28 PM      Component Value Range Comment   Glucose-Capillary 116 (*) 70 - 99 mg/dL    Comment 1 Notify RN     GLUCOSE, CAPILLARY     Status: Abnormal   Collection Time   09/15/12  7:32 AM      Component Value Range Comment    Glucose-Capillary 139 (*) 70 - 99 mg/dL   GLUCOSE, CAPILLARY     Status: Abnormal   Collection Time   09/15/12 12:03 PM      Component Value Range Comment   Glucose-Capillary 145 (*) 70 - 99 mg/dL   GLUCOSE, CAPILLARY     Status: Normal   Collection Time   09/15/12  4:59 PM      Component Value Range Comment   Glucose-Capillary 99  70 - 99 mg/dL   GLUCOSE, CAPILLARY     Status: Abnormal   Collection Time   09/15/12  9:07 PM      Component Value Range Comment   Glucose-Capillary 141 (*) 70 - 99 mg/dL    Comment 1 Notify RN     GLUCOSE, CAPILLARY     Status: Abnormal   Collection Time   09/16/12  7:17 AM      Component Value Range Comment   Glucose-Capillary 102 (*) 70 - 99 mg/dL    Comment 1 Notify RN     GLUCOSE, CAPILLARY     Status: Abnormal   Collection Time   09/16/12 11:28 AM      Component Value Range Comment   Glucose-Capillary 131 (*) 70 - 99 mg/dL    Comment 1 Notify RN     GLUCOSE, CAPILLARY     Status: Normal   Collection Time   09/16/12  4:36 PM  Component Value Range Comment   Glucose-Capillary 77  70 - 99 mg/dL    Comment 1 Notify RN     GLUCOSE, CAPILLARY     Status: Abnormal   Collection Time   09/16/12  9:10 PM      Component Value Range Comment   Glucose-Capillary 138 (*) 70 - 99 mg/dL   GLUCOSE, CAPILLARY     Status: Abnormal   Collection Time   09/17/12  7:19 AM      Component Value Range Comment   Glucose-Capillary 129 (*) 70 - 99 mg/dL    Comment 1 Notify RN       Constitutional: She is oriented to person, place, and time. She appears well-developed and well-nourished.  HENT:  Head: Normocephalic and atraumatic.  Wears upper and partial lower dentures.  Eyes: Pupils are equal, round, and reactive to light.  Neck: Normal range of motion. Neck supple.  Cardiovascular: Normal rate and regular rhythm.  Pulmonary/Chest: Effort normal and breath sounds normal. She has no wheezes. She has no rales.  Abdominal: Soft. Bowel sounds are normal.   Neurological: She is alert and oriented to person, place, and time.  Speech clear. Follows commands without difficulty. Flat affect. Right sided weakness UE>LE. RUE is 4-/5. RLE 4/5. No gross sensory deficits. Cognitively she displays good insight and awareness.  Skin: Skin is warm and dry.  Chest wall and R-shin incision clean and dry without erythema. Well-approximated , non tender  Assessment/Plan: 1. Functional deficits secondary to Cardioembolic infarct L medial fronto-parietal which require 3+ hours per day of interdisciplinary therapy in a comprehensive inpatient rehab setting. Physiatrist is providing close team supervision and 24 hour management of active medical problems listed below. Physiatrist and rehab team continue to assess barriers to discharge/monitor patient progress toward functional and medical goals. FIM: FIM - Bathing Bathing Steps Patient Completed: Chest;Right Arm;Left Arm;Abdomen;Front perineal area;Right upper leg;Left upper leg Bathing: 3: Mod-Patient completes 5-7 73f 10 parts or 50-74%  FIM - Upper Body Dressing/Undressing Upper body dressing/undressing steps patient completed: Thread/unthread right sleeve of pullover shirt/dresss;Thread/unthread left sleeve of pullover shirt/dress;Put head through opening of pull over shirt/dress;Pull shirt over trunk Upper body dressing/undressing: 4: Steadying assist FIM - Lower Body Dressing/Undressing Lower body dressing/undressing: 1: Total-Patient completed less than 25% of tasks  FIM - Toileting Toileting steps completed by patient: Adjust clothing prior to toileting;Adjust clothing after toileting Toileting: 3: Mod-Patient completed 2 of 3 steps  FIM - Radio producer Devices: Mining engineer Transfers: 4-To toilet/BSC: Min A (steadying Pt. > 75%)  FIM - Control and instrumentation engineer Devices: Walker;Arm rests Bed/Chair Transfer: 5: Bed > Chair or W/C:  Supervision (verbal cues/safety issues);5: Chair or W/C > Bed: Supervision (verbal cues/safety issues)  FIM - Locomotion: Wheelchair Distance: 150 total A secondary to sternal precautions Locomotion: Wheelchair: 1: Total Assistance/staff pushes wheelchair (Pt<25%) FIM - Locomotion: Ambulation Locomotion: Ambulation Assistive Devices: Walker - Rolling Ambulation/Gait Assistance: 5: Supervision Locomotion: Ambulation: 1: Travels less than 50 ft with supervision/safety issues  Comprehension Comprehension Mode: Auditory Comprehension: 6-Follows complex conversation/direction: With extra time/assistive device  Expression Expression Mode: Verbal Expression: 5-Expresses complex 90% of the time/cues < 10% of the time  Social Interaction Social Interaction: 5-Interacts appropriately 90% of the time - Needs monitoring or encouragement for participation or interaction.  Problem Solving Problem Solving: 6-Solves complex problems: With extra time  Memory Memory: 6-More than reasonable amt of time  Medical Problem List and Plan:  1. DVT Prophylaxis/Anticoagulation: Pharmaceutical:  Lovenox  2. Pain Management: oxycodone prn for pain. Reduce to 5mg , R hand pain likely CVA related from parietal infarct will start Gabapentin 3. Mood: Flat affect noted. Will monitor for now. LCSW to follow up  4. Neuropsych: This patient IS capable of making decisions on his/her own behalf.  5. HTN: Monitor with bid checks. Continue catapres, metoprolol and lasix.check for orthostasis  6. Breast cancer: Femara on hold increases thromboembolism risk  7. Fluid overload: Check daily weights. Continue lasix bid.  8. DM type 2: Continue lantus 24 units bid. CBG checks AC/HS with SSI for BS control to help with wound healing. Continue to hold amaryl. Anticipated upward trend as appetite improves.  9. Constipation: reports poor appetite with nausea.. Augment bowel program 10.  Insomnia restoril  LOS (Days) 7 A FACE TO  FACE EVALUATION WAS PERFORMED  Retia Cordle E 09/17/2012, 7:48 AM

## 2012-09-17 NOTE — Progress Notes (Signed)
Occupational Therapy Weekly Progress Note and Treatment Session Note  Patient Details  Name: [name] MRN: [id number] Date of Birth: 1933-11-24  Today's Date: 09/17/2012 Time: 0830-0930 and 1330-1400 Time Calculation (min): 60 min and 30 min  Patient has met 3 of 4 short term goals.  Pt has demonstrated tremendous progress towards goals with improvements in functional mobility and activity tolerance.  Pt progressed from max-total assist bed mobility to min assist bed mobility while maintaining sternal precautions.  Pt tolerates longer periods of activity and standing.  Pt continues to require assistance with LB dressing secondary to urinary frequency and the need to wear a brief at this time.  Pt had only brought dressing gowns to the hospital, so true LB dressing has been difficult to assess.  Pt hopes to return home before Christmas and is showing steady strides to get there.  Patient continues to demonstrate the following deficits: RUE weakness, decreased endurance/activity tolerance,impaired motor control, impaired dynamic standing balance and therefore will continue to benefit from skilled OT intervention to enhance overall performance with BADL, iADL and Reduce care partner burden.  Patient progressing toward long term goals..  Continue plan of care.  OT Short Term Goals Week 1:  OT Short Term Goal 1 (Week 1): Pt will complete toilet transfer with mod assist OT Short Term Goal 1 - Progress (Week 1): Met OT Short Term Goal 2 (Week 1): Pt will complete bathing with mod assist OT Short Term Goal 2 - Progress (Week 1): Met OT Short Term Goal 3 (Week 1): Pt will complete complete UB dressing with mod assist OT Short Term Goal 3 - Progress (Week 1): Met OT Short Term Goal 4 (Week 1): Pt will complete LB dressing with max assist OT Short Term Goal 4 - Progress (Week 1): Progressing toward goal Week 2:  OT Short Term Goal 1 (Week 2): STGs = LTGs due to remaining LOS  Skilled  Therapeutic Interventions/Progress Updates:    1) Pt seen for ADL retraining with focus on bed mobility, functional transfers with RW, and bathing at shower level.  Pt completed bed mobility with min assist this session, secondary to pain in Rt shoulder.  Completed bathing at walk-in shower level with pt completing transfer with close supervision and cues for proper technique and increased safety.  Pt demonstrated increased activity tolerance and ability to complete LB bathing, including buttocks and both feet.  Pt donned socks this session with increased time and reports plan to wear gowns at home and pullup if bladder issues continue.  2) 1:1 OT with focus on standing balance and activity tolerance with completing simple meal prep in ADL kitchen.  Pt maintained standing task for 10-15 minutes with icing cookies and washing dishes.  Pt reports fatigue from earlier sessions and visitors.  Educated on energy conservation strategies and carryover when at home and pacing self with visitors and tasks at home.  Therapy Documentation Precautions:  Precautions Precautions: Sternal;Fall Precaution Comments: DM; becomes clammy and diaphoretic with activity.  Fall to R and posterior Restrictions Weight Bearing Restrictions: No General:   Vital Signs: Therapy Vitals Pulse Rate: 80  BP: 140/99 mmHg Patient Position, if appropriate: Sitting Pain: Pain Assessment Pain Assessment: 0-10 Pain Score:   4 Faces Pain Scale: Hurts little more Pain Type: Chronic pain Pain Location: Breast Pain Orientation: Right Pain Descriptors: Sore Pain Intervention(s): Other (Comment) (premedicated) ADL: ADL Grooming: Setup Where Assessed-Grooming: Sitting at sink Upper Body Bathing: Supervision/safety Where Assessed-Upper Body Bathing: Shower Lower  Body Bathing: Supervision/safety Where Assessed-Lower Body Bathing: Shower Upper Body Dressing: Setup Where Assessed-Upper Body Dressing: Sitting at sink Lower Body  Dressing: Moderate assistance Where Assessed-Lower Body Dressing: Sitting at sink;Standing at sink Toilet Transfer: Close supervision Toilet Transfer Method: Counselling psychologist: Grab bars Tub/Shower Transfer: Minimal assistance Tub/Shower Transfer Method: Optometrist: Transfer tub bench  See FIM for current functional status  Therapy/Group: Individual Therapy  Sim Boast 09/17/2012, 10:47 AM

## 2012-09-17 NOTE — Progress Notes (Signed)
Physical Therapy Weekly Progress Note and Treatment Session Note  Patient Details  Name: [name] MRN: [id number] Date of Birth: [birth year]  Today's Date: 09/17/2012 Time: 1000-1056 and 1435-1500 Time Calculation (min): 56 min and 25 min (patient missed 20 min pm PT session secondary to significant fatigue and elevated vitals)  Patient has made good progress and has met 4 of 4 short term goals and 2 long term goals.  Upon evaluation patient was max A overall and significantly limited by fatigue, impaired activity tolerance/endurance, nausea.  Patient placed on 15 hours of therapy over 7 days to maximize patient therapy tolerance.  Patient has progressed to supervision-min A overall for bed mobility on flat bed, bed <> w/c transfers with RW, gait in controlled environment and for stair negotiation with rails.  Patient LOS initially set at 2-2.5 weeks but may be shortened secondary to significant progress.   Patient continues to demonstrate the following deficits: decreased activity tolerance, endurance, R sided weakness and impaired motor control, impaired dynamic standing balance, gait, increased falls risk and therefore will continue to benefit from skilled PT intervention to enhance overall performance with activity tolerance, balance, postural control, functional use of  right upper extremity and right lower extremity, coordination and knowledge of precautions.  Patient progressing toward long term goals..  Plan of care revisions: goals upgraded secondary to significant progress.  PT Short Term Goals Week 1:  PT Short Term Goal 1 (Week 1): Patient will perform bed mobility on flat bed to L and R with mod A PT Short Term Goal 1 - Progress (Week 1): Met PT Short Term Goal 2 (Week 1): Patient will perform bed <> w/c transfers stand pivot with mod A with adherence to sternal precuations PT Short Term Goal 2 - Progress (Week 1): Met PT Short Term Goal 3 (Week 1): Patient will perform gait  training with LRAD x 50' in controlled environment with min A and improved gait sequencing with decreased posterior LOB PT Short Term Goal 3 - Progress (Week 1): Met PT Short Term Goal 4 (Week 1): Patient will perform up and down 3 stairs with 2 rails and min A PT Short Term Goal 4 - Progress (Week 1): Met Week 2:  PT Short Term Goal 1 (Week 2): = LTG which have been upgraded secondary to quick progress  Skilled Therapeutic Interventions/Progress Updates:   Patient fatigued after taking shower this am; reporting low BP and low CBG this am.  Performed sit > stand from recliner with supervision.  Performed gait in controlled environment x 150' with RW and min A with one episode of LOB forward with mod A to recover.  Patient with increased LOB to R today.  Vitals assessed in gym and RN notified of elevated BP.  Patient performed dynamic standing balance and LE strengthening exercises with bilat > unilateral UE support on back of chair with min A to maintain balance during stance on RLE during one set x 10 reps: heel raises, toe raises, hip flexion marches, open chain hip ABD, open chain hip extension with multiple sitting rest breaks to drink water and assess HR; HR remained about 88 bpm during exercises.    PM session: patient extremely fatigued, pale and clammy.  Requesting to ride to gym in w/c.  In gym vitals assessed: BP:148/100 and HR:112.  RN notified.  Advised to return patient to room to rest.  Patient returned to room in w/c and transferred to w/c > bed stand pivot with RW supervision  and sit > supine with min A.  Patient unable to be upgraded to >3 hours of therapy today.  Will attempt again tomorrow.  Did discuss with patient and husband equipment needs for D/C; patient will require use of RW and manual w/c for community distances for energy conservation.    Therapy Documentation Precautions:  Precautions Precautions: Sternal;Fall Precaution Comments: DM; becomes clammy and diaphoretic with  activity.  Fall to R and posterior Restrictions Weight Bearing Restrictions: No Vital Signs: Therapy Vitals Pulse Rate: 80  BP: 140/99 mmHg Patient Position, if appropriate: Sitting Pain: Pain Assessment Pain Assessment: 0-10 Pain Score:   4 Faces Pain Scale: Hurts little more Pain Type: Chronic pain Pain Location: Breast Pain Orientation: Right Pain Descriptors: Sore Pain Intervention(s): Other (Comment) (premedicated)  See FIM for current functional status  Therapy/Group: Individual Therapy  [name] [place] 09/17/2012, 10:57 AM

## 2012-09-18 ENCOUNTER — Inpatient Hospital Stay (HOSPITAL_COMMUNITY): Payer: Medicare Other | Admitting: Physical Therapy

## 2012-09-18 ENCOUNTER — Inpatient Hospital Stay (HOSPITAL_COMMUNITY): Payer: Medicare Other | Admitting: Occupational Therapy

## 2012-09-18 LAB — BASIC METABOLIC PANEL
BUN: 17 mg/dL (ref 6–23)
CO2: 30 mEq/L (ref 19–32)
Calcium: 9 mg/dL (ref 8.4–10.5)
Chloride: 97 mEq/L (ref 96–112)
Creatinine, Ser: 0.9 mg/dL (ref 0.50–1.10)
GFR calc Af Amer: 70 mL/min — ABNORMAL LOW (ref >90)
GFR calc non Af Amer: 61 mL/min — ABNORMAL LOW (ref >90)
Glucose, Bld: 82 mg/dL (ref 70–99)
Potassium: 3.5 mEq/L (ref 3.5–5.1)
Sodium: 137 mEq/L (ref 135–145)

## 2012-09-18 LAB — GLUCOSE, CAPILLARY
Glucose-Capillary: 135 mg/dL — ABNORMAL HIGH (ref 70–99)
Glucose-Capillary: 119 mg/dL — ABNORMAL HIGH (ref 70–99)
Glucose-Capillary: 128 mg/dL — ABNORMAL HIGH (ref 70–99)

## 2012-09-18 MED ORDER — LORATADINE 10 MG PO TABS
10.0000 mg | ORAL_TABLET | Freq: Every day | ORAL | Status: DC
  Administered 2012-09-18 – 2012-09-23 (×6): 10 mg via ORAL
  Filled 2012-09-18 (×9): qty 1

## 2012-09-18 MED ORDER — CAMPHOR-MENTHOL 0.5-0.5 % EX LOTN
TOPICAL_LOTION | Freq: Two times a day (BID) | CUTANEOUS | Status: DC
  Administered 2012-09-18 – 2012-09-23 (×9): via TOPICAL
  Filled 2012-09-18 (×3): qty 222

## 2012-09-18 NOTE — Progress Notes (Signed)
Occupational Therapy Session Note  Patient Details  Name: [name] MRN: [id number] Date of Birth: 08/13/1934  Today's Date: 09/18/2012 Time: 0905-1000 Time Calculation (min): 55 min  Short Term Goals: Week 2:  OT Short Term Goal 1 (Week 2): STGs = LTGs due to remaining LOS  Skilled Therapeutic Interventions/Progress Updates:  Patient resting in bed upon arrival with husband at bedside.  Husband dismissed himself before therapy began.  Patient reports has been incontinent of bowel since "they gave me something to move my bowels" and requested that check and change brief supine and rolling as patient declined to stand for any part of this task.  Self care retraining to include sponge bath at sink, groom and declined to wear clothes from home "because I've soiled all of my clothes except one dress".  Patient initially declined to donn gripper socks yet with encouragement patient able to perform task.  Patient propelled w/c to dayroom and stood on the scale to be weighed. Also focused session on activity tolerance, standing tolerance, safe transfers.  Therapy Documentation Precautions:  Precautions Precautions: Sternal;Fall Precaution Comments: DM; becomes clammy and diaphoretic with activity.  Fall to R and posterior Restrictions Weight Bearing Restrictions: No Pain: Pain Assessment Pain Assessment: No/denies pain ADL:  See FIM for current functional status  Therapy/Group: Individual Therapy  Benedict Kue 09/18/2012, 12:42 PM

## 2012-09-18 NOTE — Patient Care Conference (Signed)
Inpatient RehabilitationTeam Conference Note Date: 09/18/2012   Time: 10:50 AM    [name]      Medical Record Number: [id number]  Date of Birth: 1934/09/10 Sex: Female         Room/Bed: 4032/4032-01 Payor Info: Payor: [employer] MEDICARE  Plan: [employer] MEDICARE COMPLETE  Product Type: *No Product type*     Admitting Diagnosis: LT FRONTAL PARIETAL CVA  Admit Date/Time:  09/10/2012  6:56 PM Admission Comments: No comment available   Primary Diagnosis:  Cerebral embolism with cerebral infarction Principal Problem: Cerebral embolism with cerebral infarction  Patient Active Problem List   Diagnosis Date Noted  . Cerebral embolism with cerebral infarction 09/07/2012  . Hemiplegia, unspecified, affecting dominant side 09/07/2012  . 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 Angina pectoris 08/26/2012  . Coronary artery disease 08/22/2012  . Throat pain 08/19/2012  . Shortness of breath 08/19/2012  . Hyperlipidemia 08/19/2012  . Diabetes 08/19/2012  . Hypertension 08/19/2012  . Breast cancer 08/19/2012  . GERD (gastroesophageal reflux disease) 08/19/2012    Expected Discharge Date: Expected Discharge Date: 09/24/12  Team Members Present: Physician leading conference: Dr. Alysia Penna Social Worker Present: Ovidio Kin, LCSW Nurse Present: Julianne Rice, RN PT Present: [name], PT;Other (comment);Ileana Ladd, PT (Bridget Ripa-PT) OT Present: Antony Salmon, OT SLP Present: Gunnar Fusi, SLP     Current Status/Progress Goal Weekly Team Focus  Medical   cardioembolic R fronto parietal CVA, s/p CABG with sternal precaution, BP runs hi at times, prro endurance  Try to upgrade to normal schedule  Med adjustments   Bowel/Bladder   Pt incontinent of bowel and bladder  Pt will be continent of bowel and bladder  Timed toileting every 2 hours while awake   Swallow/Nutrition/ Hydration     na   na     ADL's   min assist bathing, min-supervision transfers, min  assist UB dressing, mod assist LB dressing.  Pt has shown tremendous improvements towards goals  mod I overall, supervision bathing and tub/shower transfers  Sternal precautions with functional mobility, activity tolerance, standing tolerance   Mobility   supervision-min A  supervision-mod I except min A bed mobility for sternal precautions   balance, gait, activity tolerance   Communication     na   na     Safety/Cognition/ Behavioral Observations    na   na     Pain   Pt denies pain         Skin   incision to sternum, and bilateral lower extremities  Incisions will heal with out sign and symptoms of infection  Continur to monitor      *See Interdisciplinary Assessment and Plan and progress notes for long and short-term goals  Barriers to Discharge: BPs    Possible Resolutions to Barriers:  adjust meds    Discharge Planning/Teaching Needs:  HOme with husband and is doing well, wants to be home by Christmas.      Team Discussion:  Pt making good progress and moving up discharge date.  Endurance still an issue and will take time to regain.  BP and HR flucuates-MD reduced meds will monitor.  Revisions to Treatment Plan:  Changed discharge date 12/24   Continued Need for Acute Rehabilitation Level of Care: The patient requires daily medical management by a physician with specialized training in physical medicine and rehabilitation for the following conditions: Daily direction of a multidisciplinary physical rehabilitation program to ensure safe treatment while eliciting the highest  outcome that is of practical value to the patient.: Yes Daily medical management of patient stability for increased activity during participation in an intensive rehabilitation regime.: Yes Daily analysis of laboratory values and/or radiology reports with any subsequent need for medication adjustment of medical intervention for : Post surgical problems;Neurological problems  Elease Hashimoto 09/19/2012, 9:19 AM

## 2012-09-18 NOTE — Progress Notes (Signed)
Occupational Therapy Session Note  Patient Details  Name: [name] MRN: [id number] Date of Birth: [birth year]  Today's Date: 09/18/2012 Time: [id number] Time Calculation (min): 44 min   Skilled Therapeutic Interventions/Progress Updates:    Pt ambulated from her room up to the nurses station with close supervision using the RW.  Once she reached the nurses station she needed to rest and therapist finished pushing her to the OT gym in the wheelchair.  In the gym focused on RUE coordination and standing balance while incorporating the WII activity.  Had pt use the LUE to perform bowling activity, coordinating the right hand to manipulate buttons while using the correct sequencing and timing to play the game.  Pt able to tolerate standing for 6-7 mins while performing task before needing rest break.  O2 sats 97% on room air and HR 104.  Also had pt working on stepping as well when simultaneously using the RUE to activate the controller.  Therapy Documentation Precautions:  Precautions Precautions: Sternal;Fall Precaution Comments: DM; becomes clammy and diaphoretic with activity.  Fall to R and posterior Restrictions Weight Bearing Restrictions: No  Vital Signs: Therapy Vitals Pulse Rate: 104  (After standing for 6-7 mins) Patient Position, if appropriate: Sitting Oxygen Therapy SpO2: 97 % O2 Device: None (Room air) Pulse Oximetry Type: Intermittent Pain: Pain Assessment Pain Assessment: No/denies pain ADL: See FIM for current functional status  Therapy/Group: Individual Therapy  Najib Colmenares OTR/L 09/18/2012, 3:27 PM

## 2012-09-18 NOTE — Progress Notes (Signed)
Patient ID: [name], female   DOB: 08/21/1934, 77 y.o.   MRN: [id number] Subjective/Complaints: 78 y.o. right-handed female with history of coronary artery disease, hypertension, type 2 diabetes mellitus and remote tobacco abuse. Admitted 09/03/2012 with shortness of breath on exertion and tightness in her chest. Cardiac catheterization showed 70% stenosis of the left main coronary artery with severe three-vessel coronary artery disease with preserved left ventricular function. She underwent coronary artery bypass grafting x4 09/05/2012 by Dr. Roxy Manns. Postoperative day 2 with noted right-sided weakness. CT scan imaging showed subacute left medial frontal parietal infarct felt to be embolic secondary to CABG and old right frontal lobe lacunar infarct. Neurology consulted and recommended continuing aspirin therapy  No c/o of hand pain Review of Systems  Constitutional: Negative for fever and chills.  Respiratory: Negative for cough and shortness of breath.   Cardiovascular: Negative for chest pain.  Musculoskeletal: Positive for joint pain.  Psychiatric/Behavioral: The patient does not have insomnia.   All other systems reviewed and are negative.    Objective: Vital Signs: Blood pressure 126/68, pulse 88, temperature 97.7 F (36.5 C), temperature source Oral, resp. rate 18, weight 94.6 kg (208 lb 8.9 oz), SpO2 96.00%. No results found. Results for orders placed during the hospital encounter of 09/10/12 (from the past 72 hour(s))  GLUCOSE, CAPILLARY     Status: Abnormal   Collection Time   09/15/12 12:03 PM      Component Value Range Comment   Glucose-Capillary 145 (*) 70 - 99 mg/dL   GLUCOSE, CAPILLARY     Status: Normal   Collection Time   09/15/12  4:59 PM      Component Value Range Comment   Glucose-Capillary 99  70 - 99 mg/dL   GLUCOSE, CAPILLARY     Status: Abnormal   Collection Time   09/15/12  9:07 PM      Component Value Range Comment   Glucose-Capillary 141 (*) 70 - 99  mg/dL    Comment 1 Notify RN     GLUCOSE, CAPILLARY     Status: Abnormal   Collection Time   09/16/12  7:17 AM      Component Value Range Comment   Glucose-Capillary 102 (*) 70 - 99 mg/dL    Comment 1 Notify RN     GLUCOSE, CAPILLARY     Status: Abnormal   Collection Time   09/16/12 11:28 AM      Component Value Range Comment   Glucose-Capillary 131 (*) 70 - 99 mg/dL    Comment 1 Notify RN     GLUCOSE, CAPILLARY     Status: Normal   Collection Time   09/16/12  4:36 PM      Component Value Range Comment   Glucose-Capillary 77  70 - 99 mg/dL    Comment 1 Notify RN     GLUCOSE, CAPILLARY     Status: Abnormal   Collection Time   09/16/12  9:10 PM      Component Value Range Comment   Glucose-Capillary 138 (*) 70 - 99 mg/dL   CREATININE, SERUM     Status: Abnormal   Collection Time   09/17/12  6:45 AM      Component Value Range Comment   Creatinine, Ser 0.93  0.50 - 1.10 mg/dL    GFR calc non Af Amer 58 (*) >90 mL/min    GFR calc Af Amer 67 (*) >90 mL/min   GLUCOSE, CAPILLARY     Status: Abnormal   Collection Time  09/17/12  7:19 AM      Component Value Range Comment   Glucose-Capillary 129 (*) 70 - 99 mg/dL    Comment 1 Notify RN     GLUCOSE, CAPILLARY     Status: Abnormal   Collection Time   09/17/12 11:20 AM      Component Value Range Comment   Glucose-Capillary 123 (*) 70 - 99 mg/dL    Comment 1 Notify RN     GLUCOSE, CAPILLARY     Status: Abnormal   Collection Time   09/17/12  5:07 PM      Component Value Range Comment   Glucose-Capillary 124 (*) 70 - 99 mg/dL   GLUCOSE, CAPILLARY     Status: Abnormal   Collection Time   09/17/12  9:03 PM      Component Value Range Comment   Glucose-Capillary 129 (*) 70 - 99 mg/dL   BASIC METABOLIC PANEL     Status: Abnormal   Collection Time   09/18/12  5:57 AM      Component Value Range Comment   Sodium 137  135 - 145 mEq/L    Potassium 3.5  3.5 - 5.1 mEq/L    Chloride 97  96 - 112 mEq/L    CO2 30  19 - 32 mEq/L     Glucose, Bld 82  70 - 99 mg/dL    BUN 17  6 - 23 mg/dL    Creatinine, Ser 0.90  0.50 - 1.10 mg/dL    Calcium 9.0  8.4 - 10.5 mg/dL    GFR calc non Af Amer 61 (*) >90 mL/min    GFR calc Af Amer 70 (*) >90 mL/min   GLUCOSE, CAPILLARY     Status: Abnormal   Collection Time   09/18/12  7:12 AM      Component Value Range Comment   Glucose-Capillary 135 (*) 70 - 99 mg/dL    Comment 1 Notify RN       Constitutional: She is oriented to person, place, and time. She appears well-developed and well-nourished.  HENT:  Head: Normocephalic and atraumatic.  Wears upper and partial lower dentures.  Eyes: Pupils are equal, round, and reactive to light.  Neck: Normal range of motion. Neck supple.  Cardiovascular: Normal rate and regular rhythm.  Pulmonary/Chest: Effort normal and breath sounds normal. She has no wheezes. She has no rales.  Abdominal: Soft. Bowel sounds are normal.  Neurological: She is alert and oriented to person, place, and time.  Speech clear. Follows commands without difficulty. Flat affect. Right sided weakness UE>LE. RUE is 4-/5. RLE 4/5. No gross sensory deficits. Cognitively she displays good insight and awareness.  Skin: Skin is warm and dry.  Chest wall and R-shin incision clean and dry without erythema. Well-approximated , non tender  Assessment/Plan: 1. Functional deficits secondary to Cardioembolic infarct L medial fronto-parietal which require 3+ hours per day of interdisciplinary therapy in a comprehensive inpatient rehab setting. Physiatrist is providing close team supervision and 24 hour management of active medical problems listed below. Physiatrist and rehab team continue to assess barriers to discharge/monitor patient progress toward functional and medical goals. FIM: FIM - Bathing Bathing Steps Patient Completed: Chest;Right Arm;Left Arm;Abdomen;Front perineal area;Buttocks;Right upper leg;Left upper leg;Right lower leg (including foot);Left lower leg (including  foot) Bathing: 4: Steadying assist  FIM - Upper Body Dressing/Undressing Upper body dressing/undressing steps patient completed: Thread/unthread right sleeve of pullover shirt/dresss;Thread/unthread left sleeve of pullover shirt/dress;Put head through opening of pull over shirt/dress;Pull shirt over  trunk Upper body dressing/undressing: 4: Steadying assist FIM - Lower Body Dressing/Undressing Lower body dressing/undressing steps patient completed: Don/Doff right sock;Don/Doff left sock Lower body dressing/undressing: 3: Mod-Patient completed 50-74% of tasks  FIM - Toileting Toileting steps completed by patient: Adjust clothing prior to toileting;Adjust clothing after toileting Toileting: 3: Mod-Patient completed 2 of 3 steps  FIM - Radio producer Devices: Mining engineer Transfers: 4-To toilet/BSC: Min A (steadying Pt. > 75%)  FIM - Bed/Chair Transfer Bed/Chair Transfer Assistive Devices: Copy: 5: Bed > Chair or W/C: Supervision (verbal cues/safety issues);5: Chair or W/C > Bed: Supervision (verbal cues/safety issues)  FIM - Locomotion: Wheelchair Distance: 150 total A secondary to sternal precautions Locomotion: Wheelchair: 0: Activity did not occur FIM - Locomotion: Ambulation Locomotion: Ambulation Assistive Devices: Administrator Ambulation/Gait Assistance: 4: Min assist Locomotion: Ambulation: 4: Travels 150 ft or more with minimal assistance (Pt.>75%)  Comprehension Comprehension Mode: Auditory Comprehension: 6-Follows complex conversation/direction: With extra time/assistive device  Expression Expression Mode: Verbal Expression: 5-Expresses complex 90% of the time/cues < 10% of the time  Social Interaction Social Interaction: 5-Interacts appropriately 90% of the time - Needs monitoring or encouragement for participation or interaction.  Problem Solving Problem Solving: 6-Solves complex problems: With extra  time  Memory Memory: 6-More than reasonable amt of time  Medical Problem List and Plan:  1. DVT Prophylaxis/Anticoagulation: Pharmaceutical: Lovenox  2. Pain Management: oxycodone prn for pain. Reduce to 5mg , R hand pain likely CVA related from parietal infarct will start Gabapentin 3. Mood: Flat affect noted. Will monitor for now. LCSW to follow up  4. Neuropsych: This patient IS capable of making decisions on his/her own behalf.  5. HTN: Monitor with bid checks. Continue catapres, metoprolol and lasix.check for orthostasis  6. Breast cancer: Femara on hold increases thromboembolism risk  7. Fluid overload: Check daily weights. Continue lasix bid.  8. DM type 2: Continue lantus 24 units bid. CBG checks AC/HS with SSI for BS control to help with wound healing. Continue to hold amaryl. Anticipated upward trend as appetite improves.  9. Constipation: reports poor appetite with nausea.. Augment bowel program 10.  Insomnia restoril  LOS (Days) 8 A FACE TO FACE EVALUATION WAS PERFORMED  Burt Piatek E 09/18/2012, 8:44 AM

## 2012-09-18 NOTE — Progress Notes (Signed)
Social Work Patient ID: [name], female   DOB: November 30, 1933, 78 y.o.   MRN: [id number] Met with pt to inform of team conference progression toward her goals and discharge moved up to 12/24.  Pt very pleased with this and  Feels she has made good progress.  Husband has been here and participated in her care.  Discussed follow up therapyy and DME, pt is agreeable and will Work on this.  Await husband's return regarding questions.

## 2012-09-18 NOTE — Progress Notes (Signed)
Physical Therapy Session Note  Patient Details  Name: [name] MRN: [id number] Date of Birth: 02/27/1934  Today's Date: 09/18/2012 Time: 1000-1028, 1130-1200 and [id number] Time Calculation (min): 28 min, 30 min and 24 min  Short Term Goals: Week 1:  PT Short Term Goal 1 (Week 1): Patient will perform bed mobility on flat bed to L and R with mod A PT Short Term Goal 1 - Progress (Week 1): Met PT Short Term Goal 2 (Week 1): Patient will perform bed <> w/c transfers stand pivot with mod A with adherence to sternal precuations PT Short Term Goal 2 - Progress (Week 1): Met PT Short Term Goal 3 (Week 1): Patient will perform gait training with LRAD x 50' in controlled environment with min A and improved gait sequencing with decreased posterior LOB PT Short Term Goal 3 - Progress (Week 1): Met PT Short Term Goal 4 (Week 1): Patient will perform up and down 3 stairs with 2 rails and min A PT Short Term Goal 4 - Progress (Week 1): Met Week 2:  PT Short Term Goal 1 (Week 2): = LTG which have been upgraded secondary to quick progress  Skilled Therapeutic Interventions/Progress Updates:   Patient reports feeling more rested this am; performed gait in controlled environment with RW and supervision-min A with verbal cues for upright posture, full step and stride length and safe negotiation with RW around obstacles with intermittent assistance for minor lateral LOB.  Continued gait training with one UE support on wall rail to assessed balance and safety with one UE support (progress to [facility]?) with min A and continued lateral LOB.  Performed retro stepping and R lateral stepping with UE support on rail with tactile cues for full lateral and anterior/posterior weight shifting and increased step length RLE.  Required one sitting rest break prior to returning to room with RW.  2nd session: Continued gait training with assessment of safety and gait with SPC.  Educated patient on proper use and sequencing  of stepping with SPC in LUE and safe sequencing of sit <> stand with SPC; performed gait training x 75' with SPC and min A with verbal cues for sequencing and for continuous stepping and increased step length LLE; patient able to progress from 3 point gait pattern and step to technique to 2 point gait with step through technique with min A; performed gait with SPC in controlled environment and in simulated home environment/narrow spaces in room and bathroom with min A and mod verbal cues for safety, sequencing and obstacle negotiation with SPC around furniture.  Patient performed toileting with assistance to change wet brief.  Patient reporting incontinence with bowel and bladder secondary to LASIX and [place] and inability to sense when she needs to or has already had incontinent episode.  Discussed with patient and RN about timed toileting to minimize incontinent episodes.  Patient transferred to recliner for lunch with SPC and min A. Patient very fatigued after use of SPC with increased episodes of LOB with increased fatigue.  Patient will likely require use of RW at D/C for endurance and balance when fatigued but should quickly progress to [facility].  3rd session: patient just returned from OT session of playing the Wii; patient pale, flushed and with increased RR.  Vitals assessed: BP 106/42, HR: 99, Sp02: 94%.  RN notified who advised that patient should rest for remainder of the afternoon.  Patient transferred to recliner with [facility] and supervision.  Discussed with patient and husband D/C date  and need for formal education prior to D/C; husband reports he will be here this weekend; education to begin with husband this weekend with PT.    Therapy Documentation Precautions:  Precautions Precautions: Sternal;Fall Precaution Comments: DM; becomes clammy and diaphoretic with activity.  Fall to R and posterior Restrictions Weight Bearing Restrictions: No Vital Signs: Therapy Vitals Pulse Rate: 100  BP:  130/100 mmHg (after gait) Patient Position, if appropriate: Sitting Pain: Pain Assessment Pain Assessment: No/denies pain Locomotion : Ambulation Ambulation/Gait Assistance: 4: Min guard   See FIM for current functional status  Therapy/Group: Individual Therapy  [name] [facility] 09/18/2012, 10:52 AM

## 2012-09-19 ENCOUNTER — Inpatient Hospital Stay (HOSPITAL_COMMUNITY): Payer: Medicare Other | Admitting: Physical Therapy

## 2012-09-19 ENCOUNTER — Inpatient Hospital Stay (HOSPITAL_COMMUNITY): Payer: Medicare Other | Admitting: Occupational Therapy

## 2012-09-19 LAB — GLUCOSE, CAPILLARY
Glucose-Capillary: 86 mg/dL (ref 70–99)
Glucose-Capillary: 113 mg/dL — ABNORMAL HIGH (ref 70–99)
Glucose-Capillary: 109 mg/dL — ABNORMAL HIGH (ref 70–99)
Glucose-Capillary: 103 mg/dL — ABNORMAL HIGH (ref 70–99)
Glucose-Capillary: 104 mg/dL — ABNORMAL HIGH (ref 70–99)

## 2012-09-19 MED ORDER — INSULIN GLARGINE 100 UNIT/ML ~~LOC~~ SOLN
24.0000 [IU] | Freq: Every day | SUBCUTANEOUS | Status: DC
  Administered 2012-09-20 – 2012-09-22 (×3): 24 [IU] via SUBCUTANEOUS

## 2012-09-19 NOTE — Progress Notes (Signed)
Social Work Patient ID: [name], female   DOB: 1934/06/05, 78 y.o.   MRN: [id number] Pt requires a lightweight wheelchair to be able to self propel and uses for her self care tasks.  She is unable to self propel a standard wheelchair. She also uses for her endurance due to her CABG surgery.

## 2012-09-19 NOTE — Progress Notes (Signed)
Occupational Therapy Session Note  Patient Details  Name: [name] MRN: [id number] Date of Birth: 04/18/1934  Today's Date: 09/19/2012 Time: 1030-1130 and 1305-1330 Time Calculation (min): 60 min and 25 min  Short Term Goals: Week 2:  OT Short Term Goal 1 (Week 2): STGs = LTGs due to remaining LOS  Skilled Therapeutic Interventions/Progress Updates:    1) Pt seen for ADL retraining at sink per pt's request.  Pt reports feeling fatigued this AM and not feeling up to taking a shower.  Pt required setup assist to obtain items for bathing and dressing tasks.  Pt with appropriate carryover of sternal precautions during functional tasks.  Engaged in discussion regarding DME necessary for d/c home with simulated tub/shower transfer with tub bench and BSC over toilet.  Pt's husband present for end of session with focus on transfer training.  Pt plans to use BSC at home over toilet primarily, however will also use it next to bed or couch secondary to frequency and urgency.    2) 1:1 OT with focus on functional mobility and endurance.  Ambulated with [employer] with close supervision for safety.  Pt with 1 LOB to Rt and posterior, requiring mod assist to regain balance.  Engaged in standing activity at sink to challenge endurance as pt reports doing majority of cooking.  Educated on energy conservation strategies and rest breaks as needed.  Pt's husband present during session.  Therapy Documentation Precautions:  Precautions Precautions: Sternal;Fall Precaution Comments: DM; becomes clammy and diaphoretic with activity.  Fall to R and posterior Restrictions Weight Bearing Restrictions: No General:   Vital Signs: Therapy Vitals Pulse Rate: 90  Oxygen Therapy SpO2: 97 % O2 Device: None (Room air) Pain: Pain Assessment Pain Assessment: No/denies pain Pain Score: 0-No pain  See FIM for current functional status  Therapy/Group: Individual Therapy  Sim Boast 09/19/2012, 12:15  PM

## 2012-09-19 NOTE — Progress Notes (Signed)
Nutrition Note:  Spoke with patient and wife about diet for diabetes at home.  Both state that they had been trying to eat healthy for >20 years.  Had been told she had a high blood sugar prior to admit and was started on amaryl.    Educated on Basic Diabetic Diet.  Encouraged regular meals, protein with meals, portion control.  Written info on a general healthy diet for DM with RD name and number provided to patient.  Both patient and wife able to verbalize and taught back areas to change.  Please consult if further questions.  Antonieta Iba, RD, LDN Clinical Inpatient Dietitian Pager:  [phone] Weekend and after hours pager:  [phone]

## 2012-09-19 NOTE — Progress Notes (Signed)
Physical Therapy Session Note  Patient Details  Name: [name] MRN: [id number] Date of Birth: 1933-10-06  Today's Date: 09/19/2012 Time: 0806-0900 Time Calculation (min): 54 min  Short Term Goals: Week 2:  PT Short Term Goal 1 (Week 2): = LTG which have been upgraded secondary to quick progress  Skilled Therapeutic Interventions/Progress Updates:    This session focused on gait with RW supervision (pt did not want to practice with cane yet this AM due to "I want to get warmed up first").  Pt was able to sellf correct posture 150' x 2.  Stairs up and down 5 4-6" step step to pattern leading with both right and left legs supervision.  Balance activities in parallel bars with supervision: semi tandem, tandem (min assist), cone taps with both hands supported, feet together eyes open, feet together eyes open on foam pad (min assist).  Needed multiple rest breaks due to feeling hot.  VSS.  Gait speed: 1.15 ft/sec (<1.8 ft/sec indicates risk for recurrent falls).   Therapy Documentation Precautions:  Precautions Precautions: Sternal;Fall Precaution Comments: DM; becomes clammy and diaphoretic with activity.  Fall to R and posterior Restrictions Weight Bearing Restrictions: No General:   Vital Signs: Therapy Vitals Pulse Rate: 90  BP: 119/75 mmHg Oxygen Therapy SpO2: 97 % O2 Device: None (Room air) Pain: Pain Assessment Pain Assessment: No/denies pain Pain Score: 0-No pain  See FIM for current functional status  Therapy/Group: Individual Therapy  Wells Guiles B. Alta Goding, PT, DPT [phone]   09/19/2012, 9:02 AM

## 2012-09-20 ENCOUNTER — Inpatient Hospital Stay (HOSPITAL_COMMUNITY): Payer: Medicare Other | Admitting: Physical Therapy

## 2012-09-20 ENCOUNTER — Inpatient Hospital Stay (HOSPITAL_COMMUNITY): Payer: Medicare Other | Admitting: Occupational Therapy

## 2012-09-20 LAB — GLUCOSE, CAPILLARY
Glucose-Capillary: 125 mg/dL — ABNORMAL HIGH (ref 70–99)
Glucose-Capillary: 159 mg/dL — ABNORMAL HIGH (ref 70–99)
Glucose-Capillary: 98 mg/dL (ref 70–99)
Glucose-Capillary: 134 mg/dL — ABNORMAL HIGH (ref 70–99)

## 2012-09-20 NOTE — Progress Notes (Signed)
Occupational Therapy Session Note  Patient Details  Name: [name] MRN: [id number] Date of Birth: 10/14/1933  Today's Date: 09/20/2012 Time: [id number] and 1300-1330 Time Calculation (min): 55 min and 30 min  Short Term Goals: Week 2:  OT Short Term Goal 1 (Week 2): STGs = LTGs due to remaining LOS  Skilled Therapeutic Interventions/Progress Updates:    1) Pt seen for ADL retraining at sink per pt's request with focus on endurance, standing tolerance, functional mobility with cane, and transfers relating to self-care tasks.  When asked about toileting, pt reports she might have to go and was willing to try.  Ambulated to toilet with supervision and use of SPC, pt able to complete clothing management and hygiene with supervision.  Pt required 2 rest breaks during session.  Pt with questions regarding meal prep and simple housekeeping, discussed and encouraged pt to problem solve scenarios and plan to further address these in next session.  2) 1:1 OT with focus on functional mobility and safety in kitchen with simple meal prep and washing and putting away dishes.  Pt simulated simple meal prep with gathering items and problem solving through areas that might be difficult.  Pt reported plans to have husband gather items especially heavy ones (to maintain sternal precautions).  Pt emptied dish washer and put all dishes away without any LOB and no rest breaks.  Pt ambulated approx 75 feet before requiring rest break and this therapist propel w/c back to room.  Pt's husband present during session and observed cues for safety.  Therapy Documentation Precautions:  Precautions Precautions: Sternal;Fall Precaution Comments: DM; becomes clammy and diaphoretic with activity.  Fall to R and posterior Restrictions Weight Bearing Restrictions: No General:   Vital Signs: Therapy Vitals Temp: 97.7 F (36.5 C) Temp src: Oral Pulse Rate: 86  Resp: 18  BP: 128/57 mmHg Patient Position, if  appropriate: Sitting Oxygen Therapy SpO2: 98 % O2 Device: None (Room air) Pain: Pain Assessment Pain Score: 0-No pain  See FIM for current functional status  Therapy/Group: Individual Therapy  Sim Boast 09/20/2012, 9:54 AM

## 2012-09-20 NOTE — Progress Notes (Signed)
Patient ID: [name], female   DOB: 14-Oct-1933, 78 y.o.   MRN: [id number] Subjective/Complaints: 78 y.o. right-handed female with history of coronary artery disease, hypertension, type 2 diabetes mellitus and remote tobacco abuse. Admitted 09/03/2012 with shortness of breath on exertion and tightness in her chest. Cardiac catheterization showed 70% stenosis of the left main coronary artery with severe three-vessel coronary artery disease with preserved left ventricular function. She underwent coronary artery bypass grafting x4 09/05/2012 by Dr. Roxy Manns. Postoperative day 2 with noted right-sided weakness. CT scan imaging showed subacute left medial frontal parietal infarct felt to be embolic secondary to CABG and old right frontal lobe lacunar infarct. Neurology consulted and recommended continuing aspirin therapy  Pt would like to go home Monday 12/23 Review of Systems  Constitutional: Negative for fever and chills.  Respiratory: Negative for cough and shortness of breath.   Cardiovascular: Negative for chest pain.  Musculoskeletal: Positive for joint pain.  Psychiatric/Behavioral: The patient does not have insomnia.   All other systems reviewed and are negative.    Objective: Vital Signs: Blood pressure 121/77, pulse 95, temperature 97.4 F (36.3 C), temperature source Oral, resp. rate 16, height 5\' 5"  (1.651 m), weight 91.9 kg (202 lb 9.6 oz), SpO2 95.00%. No results found. Results for orders placed during the hospital encounter of 09/10/12 (from the past 72 hour(s))  GLUCOSE, CAPILLARY     Status: Abnormal   Collection Time   09/17/12  5:07 PM      Component Value Range Comment   Glucose-Capillary 124 (*) 70 - 99 mg/dL   GLUCOSE, CAPILLARY     Status: Abnormal   Collection Time   09/17/12  9:03 PM      Component Value Range Comment   Glucose-Capillary 129 (*) 70 - 99 mg/dL   BASIC METABOLIC PANEL     Status: Abnormal   Collection Time   09/18/12  5:57 AM      Component Value  Range Comment   Sodium 137  135 - 145 mEq/L    Potassium 3.5  3.5 - 5.1 mEq/L    Chloride 97  96 - 112 mEq/L    CO2 30  19 - 32 mEq/L    Glucose, Bld 82  70 - 99 mg/dL    BUN 17  6 - 23 mg/dL    Creatinine, Ser 0.90  0.50 - 1.10 mg/dL    Calcium 9.0  8.4 - 10.5 mg/dL    GFR calc non Af Amer 61 (*) >90 mL/min    GFR calc Af Amer 70 (*) >90 mL/min   GLUCOSE, CAPILLARY     Status: Abnormal   Collection Time   09/18/12  7:12 AM      Component Value Range Comment   Glucose-Capillary 135 (*) 70 - 99 mg/dL    Comment 1 Notify RN     GLUCOSE, CAPILLARY     Status: Abnormal   Collection Time   09/18/12 11:13 AM      Component Value Range Comment   Glucose-Capillary 119 (*) 70 - 99 mg/dL    Comment 1 Notify RN     GLUCOSE, CAPILLARY     Status: Abnormal   Collection Time   09/18/12  4:27 PM      Component Value Range Comment   Glucose-Capillary 128 (*) 70 - 99 mg/dL    Comment 1 Notify RN     GLUCOSE, CAPILLARY     Status: Normal   Collection Time   09/18/12 10:20  PM      Component Value Range Comment   Glucose-Capillary 86  70 - 99 mg/dL   GLUCOSE, CAPILLARY     Status: Abnormal   Collection Time   09/19/12  7:21 AM      Component Value Range Comment   Glucose-Capillary 113 (*) 70 - 99 mg/dL    Comment 1 Notify RN     GLUCOSE, CAPILLARY     Status: Abnormal   Collection Time   09/19/12 11:21 AM      Component Value Range Comment   Glucose-Capillary 109 (*) 70 - 99 mg/dL    Comment 1 Notify RN     GLUCOSE, CAPILLARY     Status: Abnormal   Collection Time   09/19/12  4:38 PM      Component Value Range Comment   Glucose-Capillary 103 (*) 70 - 99 mg/dL   GLUCOSE, CAPILLARY     Status: Abnormal   Collection Time   09/19/12  9:06 PM      Component Value Range Comment   Glucose-Capillary 104 (*) 70 - 99 mg/dL   GLUCOSE, CAPILLARY     Status: Abnormal   Collection Time   09/20/12  7:14 AM      Component Value Range Comment   Glucose-Capillary 125 (*) 70 - 99 mg/dL     Comment 1 Notify RN     GLUCOSE, CAPILLARY     Status: Abnormal   Collection Time   09/20/12 11:24 AM      Component Value Range Comment   Glucose-Capillary 159 (*) 70 - 99 mg/dL    Comment 1 Notify RN     GLUCOSE, CAPILLARY     Status: Normal   Collection Time   09/20/12  4:11 PM      Component Value Range Comment   Glucose-Capillary 98  70 - 99 mg/dL    Comment 1 Notify RN       Constitutional: She is oriented to person, place, and time. She appears well-developed and well-nourished.  HENT:  Head: Normocephalic and atraumatic.  Wears upper and partial lower dentures.  Eyes: Pupils are equal, round, and reactive to light.  Neck: Normal range of motion. Neck supple.  Cardiovascular: Normal rate and regular rhythm.  Pulmonary/Chest: Effort normal and breath sounds normal. She has no wheezes. She has no rales.  Abdominal: Soft. Bowel sounds are normal.  Neurological: She is alert and oriented to person, place, and time.  Speech clear. Follows commands without difficulty. Flat affect. Right sided weakness UE>LE. RUE is 4-/5. RLE 4/5. No gross sensory deficits. Cognitively she displays good insight and awareness.  Skin: Skin is warm and dry.  Chest wall and R-shin incision clean and dry without erythema. Well-approximated , non tender  Assessment/Plan: 1. Functional deficits secondary to Cardioembolic infarct L medial fronto-parietal which require 3+ hours per day of interdisciplinary therapy in a comprehensive inpatient rehab setting. Physiatrist is providing close team supervision and 24 hour management of active medical problems listed below. Physiatrist and rehab team continue to assess barriers to discharge/monitor patient progress toward functional and medical goals. Should be stable for D/C Mon instead of Tues FIM: FIM - Bathing Bathing Steps Patient Completed: Chest;Right Arm;Left Arm;Abdomen;Front perineal area;Buttocks;Right upper leg;Left upper leg;Right lower leg (including  foot);Left lower leg (including foot) Bathing: 5: Supervision: Safety issues/verbal cues  FIM - Upper Body Dressing/Undressing Upper body dressing/undressing steps patient completed: Thread/unthread right sleeve of pullover shirt/dresss;Thread/unthread left sleeve of pullover shirt/dress;Put head through opening of  pull over shirt/dress;Pull shirt over trunk Upper body dressing/undressing: 5: Set-up assist to: Obtain clothing/put away FIM - Lower Body Dressing/Undressing Lower body dressing/undressing steps patient completed: Don/Doff right sock;Don/Doff left sock Lower body dressing/undressing: 3: Mod-Patient completed 50-74% of tasks  FIM - Toileting Toileting steps completed by patient: Adjust clothing prior to toileting;Performs perineal hygiene;Adjust clothing after toileting Toileting Assistive Devices: Grab bar or rail for support Toileting: 5: Supervision: Safety issues/verbal cues  FIM - Radio producer Devices: Grab bars;Cane Toilet Transfers: 5-To toilet/BSC: Supervision (verbal cues/safety issues);5-From toilet/BSC: Supervision (verbal cues/safety issues)  FIM - Control and instrumentation engineer Devices: Copy: 5: Bed > Chair or W/C: Supervision (verbal cues/safety issues);5: Chair or W/C > Bed: Supervision (verbal cues/safety issues)  FIM - Locomotion: Wheelchair Distance: 150 total A secondary to sternal precautions Locomotion: Wheelchair: 0: Activity did not occur FIM - Locomotion: Ambulation Locomotion: Ambulation Assistive Devices: Administrator Ambulation/Gait Assistance: 5: Supervision Locomotion: Ambulation: 5: Travels 150 ft or more with supervision/safety issues  Comprehension Comprehension Mode: Auditory Comprehension: 6-Follows complex conversation/direction: With extra time/assistive device  Expression Expression Mode: Verbal Expression: 5-Expresses complex 90% of the time/cues < 10% of the  time  Social Interaction Social Interaction: 5-Interacts appropriately 90% of the time - Needs monitoring or encouragement for participation or interaction.  Problem Solving Problem Solving: 6-Solves complex problems: With extra time  Memory Memory: 6-More than reasonable amt of time  Medical Problem List and Plan:  1. DVT Prophylaxis/Anticoagulation: Pharmaceutical: Lovenox  2. Pain Management: oxycodone prn for pain. Reduce to 5mg , R hand pain likely CVA related from parietal infarct will start Gabapentin 3. Mood: Flat affect noted. Will monitor for now. LCSW to follow up  4. Neuropsych: This patient IS capable of making decisions on his/her own behalf.  5. HTN: Monitor with bid checks. Continue catapres, metoprolol and lasix.check for orthostasis  6. Breast cancer: Femara on hold increases thromboembolism risk  7. Fluid overload: Check daily weights. Continue lasix bid.  8. DM type 2: Continue lantus 24 units bid. CBG checks AC/HS with SSI for BS control to help with wound healing. Continue to hold amaryl. Anticipated upward trend as appetite improves.  9. Constipation: reports poor appetite with nausea.. Augment bowel program 10.  Insomnia restoril  LOS (Days) 10 A FACE TO FACE EVALUATION WAS PERFORMED  Merrel Crabbe E 09/20/2012, 5:05 PM

## 2012-09-20 NOTE — Progress Notes (Signed)
Physical Therapy Session Note  Patient Details  Name: [name] MRN: [id number] Date of Birth: 03-Jan-1934  Today's Date: 09/20/2012 Time: 1118-1205 and [id number] Time Calculation (min): 47 min and 30 min  Short Term Goals: Week 2:  PT Short Term Goal 1 (Week 2): = LTG which have been upgraded secondary to quick progress  Skilled Therapeutic Interventions/Progress Updates:   Patient reporting increased fatigue but willing to participate.  Patient requesting vitals assessed prior to beginning secondary to feeling "drained".  Vitals assessed and WNL; see below.  Performed sit > stand with RW supervision; performed gait in controlled environment with RW x 150' x 2 with supervision and no verbal cues for safety or sequencing with AD with no LOB.  Continued gait training in gym with higher level gait and household challenges of side stepping, stepping over low obstacles, over compliant surfaces, up/down one step curb, weaving L and R around obstacles, and stopping and reaching low to pick up items off the floor x 2-3 reps with RW and supervision with intermittent verbal cues for safety and sequencing with RW.  Performed LE extensor strengthening, lateral weight shift and trunk control training on Kinetron in standing with bilat UE support at 50 cm/sec x 2 minutes with tactile cues for upright posture and increased concentric and eccentric control RLE.  Patient reporting pain in bilat knees and significant fatigue.  Ceased Kinetron and allowed patient to rest prior to returning to room.   PM session:  Patient asleep in bed and required min-mod A supine > sit secondary to sternal precautions.  Performed sit > stand from bed with supervision but verbal cues to avoid pulling up on RW.  Performed toilet transfer with RW supervision and toileting with assistance to change wet brief.  Requested to be transported to gym in w/c secondary to fatigue.  Patient received w/c and RW for home.  Adjusted RW height  and w/c leg rests to appropriate height and positioning.  Performed Berg balance re-assessment.  See navigator for individual results; Patient demonstrates moderate fall risk as noted by score of 48/56 on Berg Balance Scale.  (<36= high risk for falls, close to 100%; 37-45 significant >80%; 46-51 moderate >50%; 52-55 lower >25%).  Patient will still benefit from use of RW for increased endurance during home and community mobility.     Therapy Documentation Precautions:  Precautions Precautions: Sternal;Fall Precaution Comments: DM; becomes clammy and diaphoretic with activity.  Fall to R and posterior Restrictions Weight Bearing Restrictions: No Vital Signs: Therapy Vitals Pulse Rate: 92  BP: 103/72 mmHg Patient Position, if appropriate: Sitting Oxygen Therapy SpO2: 95 % O2 Device: None (Room air) Pulse Oximetry Type: Intermittent Pain: Pain Assessment Pain Assessment: No/denies pain Locomotion : Ambulation Ambulation/Gait Assistance: 5: Supervision   See FIM for current functional status  Therapy/Group: Individual Therapy  [name] [facility] 09/20/2012, 12:43 PM

## 2012-09-21 ENCOUNTER — Inpatient Hospital Stay (HOSPITAL_COMMUNITY): Payer: Medicare Other | Admitting: Physical Therapy

## 2012-09-21 LAB — GLUCOSE, CAPILLARY
Glucose-Capillary: 119 mg/dL — ABNORMAL HIGH (ref 70–99)
Glucose-Capillary: 132 mg/dL — ABNORMAL HIGH (ref 70–99)
Glucose-Capillary: 147 mg/dL — ABNORMAL HIGH (ref 70–99)
Glucose-Capillary: 134 mg/dL — ABNORMAL HIGH (ref 70–99)

## 2012-09-21 NOTE — Progress Notes (Signed)
Physical Therapy Note  Patient Details  Name: [name] MRN: [id number] Date of Birth: 1934/03/14 Today's Date: 09/21/2012  1300-1355 (55 minutes) group Pain: No complaint of pain Pt participated in PT group session focused on gait training/safety/endurance. Pt ambulates with RW close SBA 160 feet X 3 .    Osias Resnick,JIM 09/21/2012, 7:33 AM

## 2012-09-21 NOTE — Progress Notes (Signed)
Patient ID: [name], female   DOB: Jul 26, 1934, 78 y.o.   MRN: [id number] Subjective/Complaints: 78 y.o. right-handed female with history of coronary artery disease, hypertension, type 2 diabetes mellitus and remote tobacco abuse. Admitted 09/03/2012 with shortness of breath on exertion and tightness in her chest. Cardiac catheterization showed 70% stenosis of the left main coronary artery with severe three-vessel coronary artery disease with preserved left ventricular function. She underwent coronary artery bypass grafting x4 09/05/2012 by Dr. Roxy Manns. Postoperative day 2 with noted right-sided weakness. CT scan imaging showed subacute left medial frontal parietal infarct felt to be embolic secondary to CABG and old right frontal lobe lacunar infarct. Neurology consulted and recommended continuing aspirin therapy   no specific complaints today Review of Systems  Constitutional: Negative for fever and chills.  Respiratory: Negative for cough and shortness of breath.   Cardiovascular: Negative for chest pain.  Musculoskeletal: Positive for joint pain.  Psychiatric/Behavioral: The patient does not have insomnia.     Objective: Vital Signs: Blood pressure 103/69, pulse 82, temperature 97.8 F (36.6 C), temperature source Oral, resp. rate 18, height 5\' 5"  (1.651 m), weight 203 lb 0.7 oz (92.1 kg), SpO2 96.00%.   Well-developed well-nourished female in no acute distress. HEENT exam atraumatic, normocephalic, extraocular muscles are intact. Neck is supple. No jugular venous distention no thyromegaly. Chest clear to auscultation without increased work of breathing. Cardiac exam S1 and S2 are regular. Abdominal exam active bowel sounds, soft, nontender.   Assessment/Plan: 1. Functional deficits secondary to Cardioembolic infarct L medial fronto-parietal  Medical Problem List and Plan:  1. DVT Prophylaxis/Anticoagulation: Pharmaceutical: Lovenox  2. Pain Management: oxycodone prn for pain. Reduce  to 5mg , R hand pain likely CVA related from parietal infarct will start Gabapentin 3. Mood: Flat affect noted. Will monitor for now. LCSW to follow up  4. Neuropsych: This patient IS capable of making decisions on his/her own behalf.  5. HTN: Monitor with bid checks. Continue catapres, metoprolol and lasix.check for orthostasis  6. Breast cancer: Femara on hold increases thromboembolism risk  7. Fluid overload: Check daily weights. Continue lasix bid.  8. DM type 2: Continue lantus 24 units bid. CBG checks AC/HS with SSI for BS control to help with wound healing. Continue to hold amaryl. Anticipated upward trend as appetite improves.  9. Constipation: reports poor appetite with nausea.. Augment bowel program 10.  Insomnia restoril  LOS (Days) 11 A FACE TO FACE EVALUATION WAS PERFORMED  [name] 09/21/2012, 9:25 AM

## 2012-09-22 ENCOUNTER — Inpatient Hospital Stay (HOSPITAL_COMMUNITY): Payer: Medicare Other | Admitting: Physical Therapy

## 2012-09-22 LAB — GLUCOSE, CAPILLARY
Glucose-Capillary: 110 mg/dL — ABNORMAL HIGH (ref 70–99)
Glucose-Capillary: 97 mg/dL (ref 70–99)
Glucose-Capillary: 99 mg/dL (ref 70–99)
Glucose-Capillary: 104 mg/dL — ABNORMAL HIGH (ref 70–99)

## 2012-09-22 NOTE — Progress Notes (Signed)
Physical Therapy Discharge Summary  Patient Details  Name: [name] MRN: [id number] Date of Birth: 02-14-1934  Today's Date: 09/23/2012 Time: 1100- 1115 and 1220-1230 Time Calculation (min): 15 minutes and 10 minutes  Today's session focused on re-evaluation prior to discharge and car transfer to [facility] that patient discharged to. Patient refused participation in full scheduled therapy session secondary to "not feeling well from the sugary stuff they made me drink to go to the bathroom." Additionally, during time of scheduled therapy session, patient with PA, RN, and Pharmacy regarding insulin use at home. Performed remainder of re-evaluation prior to d/c and assisted patient with transfer to [facility] upon d/c from hospital. Patient performed [facility] transfer with rolling walker and supervision provided from husband. Patient demonstrates good recall of sternal precautions during van transfer.  Patient has met 9 of 9 long term goals due to improved activity tolerance, improved balance, improved postural control, increased strength and functional use of  right upper extremity and right lower extremity.  Patient to discharge at an ambulatory level Modified Independent.   Patient's care partner is independent to provide the necessary supervision assistance at discharge.  Reasons goals not met: N/A, all goals met  Recommendation:  Patient will benefit from ongoing skilled PT services in home health setting to continue to advance safe functional mobility, address ongoing impairments in R sided functional strength and muscular endurance, impaired dynamic standing balance, gait, and minimize fall risk.  Equipment: RW, wheelchair for longer community distances for energy conservation  Reasons for discharge: treatment goals met and discharge from hospital  Patient/family agrees with progress made and goals achieved: Yes  PT Discharge Precautions/Restrictions Precautions Precautions:  Sternal Pain Pain Assessment Pain Assessment: No/denies pain Sensation Sensation Light Touch: Appears Intact Hot/Cold: Appears Intact Proprioception: Appears Intact Coordination Gross Motor Movements are Fluid and Coordinated: No Fine Motor Movements are Fluid and Coordinated: No Heel Shin Test: Slight dysmetria R LE Motor  Motor Motor: Abnormal postural alignment and control;Hemiplegia Motor - Discharge Observations: Continued RLE weakness and impaired balance/postural control in standing  Mobility Bed Mobility Bed Mobility: Sit to Supine;Supine to Sit Supine to Sit: 6: Modified independent (Device/Increase time) Supine to Sit Details (indicate cue type and reason): Use of log rolling to minimize strain on chest/UE Sit to Supine: 6: Modified independent (Device/Increase time) Transfers Stand Pivot Transfers: 6: Modified independent (Device/Increase time);With armrests Stand Pivot Transfer Details (indicate cue type and reason): Mod I with RW with good recall of sternal precautions Car Transfer: Supervision with RW with good recall of sternal precautions Locomotion  Ambulation Ambulation/Gait Assistance: 6: Modified independent (Device/Increase time) Ambulation Distance (Feet): 150 Feet Assistive device: Rolling walker Stairs / Additional Locomotion Stairs Assistance: 5: Supervision Stair Management Technique: One rail Left;Step to pattern;Forwards Number of Stairs: 8  Height of Stairs: 4  Wheelchair Mobility Wheelchair Mobility: No  Trunk/Postural Assessment  Postural Control Postural Control:  (impaired balance strategies without UE support)  Balance Standardized Balance Assessment Standardized Balance Assessment: Berg Balance Test Berg Balance Test Sit to Stand: Able to stand  independently using hands Standing Unsupported: Able to stand safely 2 minutes Sitting with Back Unsupported but Feet Supported on Floor or Stool: Able to sit safely and securely 2  minutes Stand to Sit: Sits safely with minimal use of hands Transfers: Able to transfer safely, minor use of hands Standing Unsupported with Eyes Closed: Able to stand 10 seconds safely Standing Ubsupported with Feet Together: Able to place feet together independently and stand for 1 minute  with supervision From Standing, Reach Forward with Outstretched Arm: Can reach confidently >25 cm (10") From Standing Position, Pick up Object from Floor: Able to pick up shoe safely and easily From Standing Position, Turn to Look Behind Over each Shoulder: Looks behind from both sides and weight shifts well Turn 360 Degrees: Able to turn 360 degrees safely in 4 seconds or less Standing Unsupported, Alternately Place Feet on Step/Stool: Able to stand independently and complete 8 steps >20 seconds Standing Unsupported, One Foot in Front: Able to take small step independently and hold 30 seconds Standing on One Leg: Tries to lift leg/unable to hold 3 seconds but remains standing independently Total Score: 48  Extremity Assessment  RLE Strength RLE Overall Strength: Deficits RLE Overall Strength Comments: 4/5 overall LLE Assessment LLE Assessment: Within Functional Limits  See FIM for current functional status  [name] 09/23/2012, 12:22 PM

## 2012-09-22 NOTE — Plan of Care (Signed)
Problem: RH BLADDER ELIMINATION Goal: RH STG MANAGE BLADDER WITH ASSISTANCE STG Manage Bladder With moderate Assistance  Outcome: Completed/Met Date Met:  09/22/12 Occassional incontinence in brief

## 2012-09-22 NOTE — Progress Notes (Signed)
Patient requested sleeping aid around 2325, Ambien 5 mg given PO. Patient reports that she slept til 5 am this morning. Medication was effective this shift for the patient. Continue plan of care.

## 2012-09-22 NOTE — Progress Notes (Signed)
Patient ID: [name], female   DOB: 1934-01-25, 78 y.o.   MRN: [id number] Subjective/Complaints: 78 y.o. right-handed female with history of coronary artery disease, hypertension, type 2 diabetes mellitus and remote tobacco abuse. Admitted 09/03/2012 with shortness of breath on exertion and tightness in her chest. Cardiac catheterization showed 70% stenosis of the left main coronary artery with severe three-vessel coronary artery disease with preserved left ventricular function. She underwent coronary artery bypass grafting x4 09/05/2012 by Dr. Roxy Manns. Postoperative day 2 with noted right-sided weakness. CT scan imaging showed subacute left medial frontal parietal infarct felt to be embolic secondary to CABG and old right frontal lobe lacunar infarct. Neurology consulted and recommended continuing aspirin therapy  No chest pain or SOB.  Review of Systems  Constitutional: Negative for fever and chills.  Respiratory: Negative for cough and shortness of breath.   Cardiovascular: Negative for chest pain.  Musculoskeletal: Positive for joint pain.  Psychiatric/Behavioral: The patient does not have insomnia.     Objective: Vital Signs: Blood pressure 115/72, pulse 84, temperature 97.6 F (36.4 C), temperature source Oral, resp. rate 18, height 5\' 5"  (1.651 m), weight 203 lb 0.7 oz (92.1 kg), SpO2 96.00%.   Well-developed well-nourished female in no acute distress. HEENT exam atraumatic, normocephalic, extraocular muscles are intact. Neck is supple. No jugular venous distention no thyromegaly. Chest clear to auscultation without increased work of breathing. Cardiac exam S1 and S2 are regular. Abdominal exam active bowel sounds, soft, nontender.   Assessment/Plan: 1. Functional deficits secondary to Cardioembolic infarct L medial fronto-parietal  Medical Problem List and Plan:  1. DVT Prophylaxis/Anticoagulation: Pharmaceutical: Lovenox  2. Pain Management: no pain today 3. Mood: Flat affect  noted. Will monitor for now. LCSW to follow up  4. Neuropsych: This patient IS capable of making decisions on his/her own behalf.  5. HTN: Monitor with bid checks. Continue catapres, metoprolol and lasix.check for orthostasis  6. Breast cancer: Femara on hold increases thromboembolism risk  7. Fluid overload: Check daily weights. Continue lasix bid.  8. DM type 2: Continue lantus 24 units bid. CBG checks AC/HS with SSI for BS control to help with wound healing. Continue to hold amaryl. Anticipated upward trend as appetite improves.  CBG (last 3)   Basename 09/22/12 0734 09/21/12 2112 09/21/12 1650  GLUCAP 110* 134* 147*    9. Constipation: improved 10.  Insomnia restoril  LOS (Days) 12 A FACE TO FACE EVALUATION WAS PERFORMED  Tanja Gift HENRY 09/22/2012, 9:18 AM

## 2012-09-22 NOTE — Plan of Care (Signed)
Problem: RH SAFETY Goal: RH STG ADHERE TO SAFETY PRECAUTIONS W/ASSISTANCE/DEVICE STG Adhere to Safety Precautions With Min. Assist/device.  Outcome: Completed/Met Date Met:  09/22/12 Pt able to state and independently follow sternal precautions

## 2012-09-22 NOTE — Progress Notes (Signed)
Physical Therapy Session Note  Patient Details  Name: [name] MRN: [id number] Date of Birth: 03-May-1934  Today's Date: 09/22/2012 Time: 1120-1200 Time Calculation (min): 40 min  Short Term Goals: Week 2:  PT Short Term Goal 1 (Week 2): = LTG which have been upgraded secondary to quick progress  Skilled Therapeutic Interventions/Progress Updates:   Husband present for family education;  Discussed transportation options on D/C day; reports a friend will bring a [employer] to transport patient home in order to carry all equipment.  Attempted tall simulated van transfer with patient stepping L foot in first while holding over head handle and then placing buttocks on the seat.  Reminded patient that with sternal precautions she may not be able to reach over head and pull herself into the seat with her LUE.  Will have PT go down tomorrow with patient and husband to ensure safe w/c > van transfer upon D/C.  Raised bed to patient's bed height at home; patient demonstrated mod I transfer to bed with RW and sit <> supine on tall, flat bed with use of log roll for supine > sit to minimize strain on chest.  Reviewed stair negotiation with husband and safe sequence with L rail with step to technique to ascend and descend with supervision up and down 4 stairs x 2 reps.   No further questions or concerns from patient or husband.  Therapy Documentation Precautions:  Precautions Precautions: Sternal Precaution Comments: DM; becomes clammy and diaphoretic with activity.  Fall to R and posterior Restrictions Weight Bearing Restrictions: No Pain: Pain Assessment Pain Assessment: No/denies pain Mobility: Bed Mobility Supine to Sit: 6: Modified independent (Device/Increase time) with use of log roll to R side and then side to sit on tall flat bed Sit to Supine: 6: Modified independent (Device/Increase time) on tall flat bed Transfers Stand Pivot Transfers: 6: Modified independent (Device/Increase  time);With armrests Stand Pivot Transfer Details (indicate cue type and reason): Mod I with RW with good recall of sternal precautions. Locomotion : Ambulation Ambulation/Gait Assistance: 6: Modified independent (Device/Increase time)   See FIM for current functional status  Therapy/Group: Individual Therapy  [name] [facility] 09/22/2012, 1:05 PM

## 2012-09-23 ENCOUNTER — Inpatient Hospital Stay (HOSPITAL_COMMUNITY): Payer: Medicare Other | Admitting: Occupational Therapy

## 2012-09-23 ENCOUNTER — Inpatient Hospital Stay (HOSPITAL_COMMUNITY): Payer: Medicare Other | Admitting: *Deleted

## 2012-09-23 DIAGNOSIS — I6340 Cerebral infarction due to embolism of unspecified cerebral artery: Secondary | ICD-10-CM

## 2012-09-23 DIAGNOSIS — G8110 Spastic hemiplegia affecting unspecified side: Secondary | ICD-10-CM

## 2012-09-23 LAB — GLUCOSE, CAPILLARY
Glucose-Capillary: 103 mg/dL — ABNORMAL HIGH (ref 70–99)
Glucose-Capillary: 179 mg/dL — ABNORMAL HIGH (ref 70–99)

## 2012-09-23 MED ORDER — GABAPENTIN 100 MG PO CAPS: 100.0000 mg | ORAL_CAPSULE | Freq: Every day | ORAL | Status: DC

## 2012-09-23 MED ORDER — INSULIN GLARGINE 100 UNIT/ML ~~LOC~~ SOLN: 20.0000 [IU] | Freq: Every day | SUBCUTANEOUS | Status: DC

## 2012-09-23 MED ORDER — INSULIN PEN STARTER KIT: 1.0000 | Freq: Once | Status: DC

## 2012-09-23 MED ORDER — TRAMADOL HCL 50 MG PO TABS: 50.0000 mg | ORAL_TABLET | ORAL | Status: DC | PRN

## 2012-09-23 MED ORDER — METOPROLOL TARTRATE 50 MG PO TABS: 50.0000 mg | ORAL_TABLET | Freq: Two times a day (BID) | ORAL | Status: DC

## 2012-09-23 MED ORDER — POTASSIUM CHLORIDE CRYS ER 20 MEQ PO TBCR: 20.0000 meq | EXTENDED_RELEASE_TABLET | Freq: Two times a day (BID) | ORAL | Status: DC

## 2012-09-23 MED ORDER — CLONIDINE HCL 0.2 MG/24HR TD PTWK: 1.0000 | MEDICATED_PATCH | TRANSDERMAL | Status: DC

## 2012-09-23 MED ORDER — INSULIN PEN STARTER KIT
1.0000 | Freq: Once | Status: AC
  Administered 2012-09-23: 1
  Filled 2012-09-23: qty 1

## 2012-09-23 MED ORDER — FUROSEMIDE 40 MG PO TABS: 40.0000 mg | ORAL_TABLET | Freq: Two times a day (BID) | ORAL | Status: DC

## 2012-09-23 MED ORDER — GLIMEPIRIDE 4 MG PO TABS: 4.0000 mg | ORAL_TABLET | Freq: Every day | ORAL | Status: DC

## 2012-09-23 MED ORDER — OXYCODONE HCL 5 MG PO TABS: 5.0000 mg | ORAL_TABLET | Freq: Three times a day (TID) | ORAL | Status: DC | PRN

## 2012-09-23 MED ORDER — CAMPHOR-MENTHOL 0.5-0.5 % EX LOTN: TOPICAL_LOTION | Freq: Two times a day (BID) | CUTANEOUS | Status: DC

## 2012-09-23 MED ORDER — CLONIDINE HCL 0.2 MG/24HR TD PTWK
0.2000 mg | MEDICATED_PATCH | TRANSDERMAL | Status: DC
  Administered 2012-09-23: 0.2 mg via TRANSDERMAL

## 2012-09-23 MED ORDER — SORBITOL 70 % SOLN
45.0000 mL | Freq: Once | Status: AC
  Administered 2012-09-23: 45 mL via ORAL
  Filled 2012-09-23: qty 30
  Filled 2012-09-23: qty 60

## 2012-09-23 MED ORDER — POLYETHYLENE GLYCOL 3350 17 G PO PACK: 17.0000 g | PACK | Freq: Two times a day (BID) | ORAL | Status: DC

## 2012-09-23 MED ORDER — CLONIDINE HCL 0.1 MG/24HR TD PTWK: 0.1000 mg | MEDICATED_PATCH | TRANSDERMAL | Status: DC

## 2012-09-23 NOTE — Progress Notes (Signed)
Occupational Therapy Discharge Summary  Patient Details  Name: [name] MRN: [id number] Date of Birth: 1933/12/12  Today's Date: 09/23/2012 Time: 0830-0930 Time Calculation (min): 60 min  Patient has met 12 of 12 long term goals due to improved activity tolerance, improved balance, postural control, functional use of  RIGHT upper extremity and improved awareness.  Patient to discharge at overall Modified Independent level.  Patient's care partner is independent to provide the necessary cognitive assistance at discharge.  Patient will require supervision with tub/shower transfer and would benefit from supervision with bathing, initially.   Reasons goals not met: N/A  Recommendation:  Patient will benefit from ongoing skilled OT services in home health setting to continue to advance functional skills in the area of BADL, iADL and Reduce care partner burden.  Equipment: 3 in 1 BSC, tub bench, and RW  Reasons for discharge: treatment goals met and discharge from hospital  Patient/family agrees with progress made and goals achieved: Yes  OT Discharge Precautions/Restrictions  Precautions Precautions: Sternal Pain Pain Assessment Pain Assessment: No/denies pain Pain Score: 0-No pain ADL ADL Eating: Independent Where Assessed-Eating: Chair Grooming: Independent Where Assessed-Grooming: Sitting at sink Upper Body Bathing: Supervision/safety Where Assessed-Upper Body Bathing: Shower Lower Body Bathing: Supervision/safety Where Assessed-Lower Body Bathing: Shower Upper Body Dressing: Modified independent (Device) Where Assessed-Upper Body Dressing: Edge of bed Lower Body Dressing: Modified independent Where Assessed-Lower Body Dressing: Edge of bed Toileting: Modified independent Toilet Transfer: Modified independent Armed forces technical officer Method: Counselling psychologist: Bedside commode;Grab bars Tub/Shower Transfer: Close supervison Clinical cytogeneticist Method:  Optometrist: Facilities manager: Distant supervision Social research officer, government Method: Heritage manager: Radio broadcast assistant Vision/Perception  Vision - History Baseline Vision: Wears glasses only for reading Patient Visual Report: No change from baseline Vision - Assessment Eye Alignment: Within Designer, television/film set Perception: Within Functional Limits Praxis Praxis: Intact  Cognition Overall Cognitive Status: Appears within functional limits for tasks assessed Arousal/Alertness: Awake/alert Orientation Level: Oriented X4 Attention: Alternating Safety/Judgment: Appears intact Sensation Sensation Light Touch: Appears Intact Hot/Cold: Appears Intact Proprioception: Appears Intact Coordination Gross Motor Movements are Fluid and Coordinated: No Fine Motor Movements are Fluid and Coordinated: No Extremity/Trunk Assessment RUE Assessment RUE Assessment: Exceptions to [facility] (limited ROM secondary to sternal precautions) LUE Assessment LUE Assessment: Within Functional Limits  See FIM for current functional status  Sim Boast 09/23/2012, 12:01 PM

## 2012-09-23 NOTE — Progress Notes (Signed)
Educated patient and spouse on insulin pen use at home.  Reviewed contents of insulin flexpen starter kit.  Reviewed all steps if insulin pen including attachment of needle, 2-unit air shot, dialing up dose, giving injection, removing needle, disposal of sharps, storage of unused insulin, disposal of insulin etc.  Patient able to provide successful return demonstration.  Also reviewed troubleshooting with insulin pen.  MD to give patient Rxs for insulin pens and insulin pen needles.  Will follow. Wyn Quaker RN, MSN, CDE Diabetes Coordinator Inpatient Diabetes Program [phone]

## 2012-09-23 NOTE — Progress Notes (Signed)
Attempted to teach patient how to administer insulin via syringe and vial, since she will use an insulin pen at discharge. Primary care team made aware. Pharmacy to teach use of insulin pen. Will continue to monitor.

## 2012-09-23 NOTE — Progress Notes (Signed)
Patient ID: [name], female   DOB: Jun 03, 1934, 78 y.o.   MRN: [id number] Subjective/Complaints: 78 y.o. right-handed female with history of coronary artery disease, hypertension, type 2 diabetes mellitus and remote tobacco abuse. Admitted 09/03/2012 with shortness of breath on exertion and tightness in her chest. Cardiac catheterization showed 70% stenosis of the left main coronary artery with severe three-vessel coronary artery disease with preserved left ventricular function. She underwent coronary artery bypass grafting x4 09/05/2012 by Dr. Roxy Weiss. Postoperative day 2 with noted right-sided weakness. CT scan imaging showed subacute left medial frontal parietal infarct felt to be embolic secondary to CABG and old right frontal lobe lacunar infarct. Neurology consulted and recommended continuing aspirin therapy  Pt would like to go home Monday 12/23 Review of Systems  Constitutional: Negative for fever and chills.  Respiratory: Negative for cough and shortness of breath.   Cardiovascular: Negative for chest pain.  Musculoskeletal: Positive for joint pain.  Psychiatric/Behavioral: The patient does not have insomnia.   All other systems reviewed and are negative.    Objective: Vital Signs: Blood pressure 111/68, pulse 87, temperature 98 F (36.7 C), temperature source Oral, resp. rate 18, height 5\' 5"  (1.651 m), weight 92.1 kg (203 lb 0.7 oz), SpO2 96.00%. No results found. Results for orders placed during the hospital encounter of 09/10/12 (from the past 72 hour(s))  GLUCOSE, CAPILLARY     Status: Abnormal   Collection Time   09/20/12 11:24 AM      Component Value Range Comment   Glucose-Capillary 159 (*) 70 - 99 mg/dL    Comment 1 Notify RN     GLUCOSE, CAPILLARY     Status: Normal   Collection Time   09/20/12  4:11 PM      Component Value Range Comment   Glucose-Capillary 98  70 - 99 mg/dL    Comment 1 Notify RN     GLUCOSE, CAPILLARY     Status: Abnormal   Collection Time    09/20/12  8:50 PM      Component Value Range Comment   Glucose-Capillary 134 (*) 70 - 99 mg/dL   GLUCOSE, CAPILLARY     Status: Abnormal   Collection Time   09/21/12  7:38 AM      Component Value Range Comment   Glucose-Capillary 119 (*) 70 - 99 mg/dL    Comment 1 Documented in Chart     GLUCOSE, CAPILLARY     Status: Abnormal   Collection Time   09/21/12 11:01 AM      Component Value Range Comment   Glucose-Capillary 132 (*) 70 - 99 mg/dL    Comment 1 Documented in Chart     GLUCOSE, CAPILLARY     Status: Abnormal   Collection Time   09/21/12  4:50 PM      Component Value Range Comment   Glucose-Capillary 147 (*) 70 - 99 mg/dL   GLUCOSE, CAPILLARY     Status: Abnormal   Collection Time   09/21/12  9:12 PM      Component Value Range Comment   Glucose-Capillary 134 (*) 70 - 99 mg/dL    Comment 1 Notify RN     GLUCOSE, CAPILLARY     Status: Abnormal   Collection Time   09/22/12  7:34 AM      Component Value Range Comment   Glucose-Capillary 110 (*) 70 - 99 mg/dL    Comment 1 Notify RN     GLUCOSE, CAPILLARY     Status: Normal  Collection Time   09/22/12 12:11 PM      Component Value Range Comment   Glucose-Capillary 97  70 - 99 mg/dL    Comment 1 Notify RN     GLUCOSE, CAPILLARY     Status: Normal   Collection Time   09/22/12  4:39 PM      Component Value Range Comment   Glucose-Capillary 99  70 - 99 mg/dL   GLUCOSE, CAPILLARY     Status: Abnormal   Collection Time   09/22/12  8:49 PM      Component Value Range Comment   Glucose-Capillary 104 (*) 70 - 99 mg/dL    Comment 1 Notify RN     GLUCOSE, CAPILLARY     Status: Abnormal   Collection Time   09/23/12  7:12 AM      Component Value Range Comment   Glucose-Capillary 103 (*) 70 - 99 mg/dL    Comment 1 Notify RN       Constitutional: She is oriented to person, place, and time. She appears well-developed and well-nourished.  HENT:  Head: Normocephalic and atraumatic.  Wears upper and partial lower dentures.   Eyes: Pupils are equal, round, and reactive to light.  Neck: Normal range of motion. Neck supple.  Cardiovascular: Normal rate and regular rhythm.  Pulmonary/Chest: Effort normal and breath sounds normal. She has no wheezes. She has no rales.  Abdominal: Soft. Bowel sounds are normal.  Neurological: She is alert and oriented to person, place, and time.  Speech clear. Follows commands without difficulty. Flat affect. Right sided weakness UE>LE. RUE is 4-/5. RLE 4/5. No gross sensory deficits. Cognitively she displays good insight and awareness.  Skin: Skin is warm and dry.  Chest wall and R-shin incision clean and dry without erythema. Well-approximated , non tender  Assessment/Plan: 1. Functional deficits secondary to Cardioembolic infarct L medial fronto-parietal  stable for D/C today F/U PCP F/U PMR F/U Neuro F/U CVTS   FIM: FIM - Bathing Bathing Steps Patient Completed: Chest;Right Arm;Left Arm;Abdomen;Front perineal area;Buttocks;Right upper leg;Left upper leg;Right lower leg (including foot);Left lower leg (including foot) Bathing: 5: Supervision: Safety issues/verbal cues  FIM - Upper Body Dressing/Undressing Upper body dressing/undressing steps patient completed: Thread/unthread right sleeve of pullover shirt/dresss;Thread/unthread left sleeve of pullover shirt/dress;Put head through opening of pull over shirt/dress;Pull shirt over trunk Upper body dressing/undressing: 6: More than reasonable amount of time FIM - Lower Body Dressing/Undressing Lower body dressing/undressing steps patient completed: Thread/unthread right underwear leg;Thread/unthread left underwear leg;Pull underwear up/down;Thread/unthread right pants leg;Thread/unthread left pants leg;Pull pants up/down;Don/Doff right sock;Don/Doff left sock;Don/Doff right shoe;Don/Doff left shoe Lower body dressing/undressing: 6: More than reasonable amount of time  FIM - Toileting Toileting steps completed by patient: Adjust  clothing prior to toileting;Performs perineal hygiene;Adjust clothing after toileting Toileting Assistive Devices: Grab bar or rail for support Toileting: 6: More than reasonable amount of time  FIM - Radio producer Devices: Grab bars;Oncologist Transfers: 6-Assistive device: No helper;6-From toilet/BSC;6-To toilet/ BSC  FIM - Control and instrumentation engineer Devices: Copy: 6: Supine > Sit: No assist;6: Bed > Chair or W/C: No assist  FIM - Locomotion: Wheelchair Distance: 150 total A secondary to sternal precautions Locomotion: Wheelchair: 1: Total Assistance/staff pushes wheelchair (Pt<25%) FIM - Locomotion: Ambulation Locomotion: Ambulation Assistive Devices: Administrator Ambulation/Gait Assistance: 6: Modified independent (Device/Increase time) Locomotion: Ambulation: 5: Household Independent - travels 56 - 149 ft independent or modified independent  Comprehension Comprehension Mode: Auditory Comprehension: 6-Follows  complex conversation/direction: With extra time/assistive device  Expression Expression Mode: Verbal Expression: 5-Expresses complex 90% of the time/cues < 10% of the time  Social Interaction Social Interaction: 5-Interacts appropriately 90% of the time - Needs monitoring or encouragement for participation or interaction.  Problem Solving Problem Solving: 6-Solves complex problems: With extra time  Memory Memory: 6-More than reasonable amt of time  Medical Problem List and Plan:  1. DVT Prophylaxis/Anticoagulation: Pharmaceutical: Lovenox  2. Pain Management: oxycodone prn for pain. Reduce to 5mg , R hand pain likely CVA related from parietal infarct will start Gabapentin 3. Mood: Flat affect noted. Will monitor for now. LCSW to follow up  4. Neuropsych: This patient IS capable of making decisions on his/her own behalf.  5. HTN: Monitor with bid checks. Continue catapres, metoprolol and  lasix.check for orthostasis  6. Breast cancer: Femara on hold increases thromboembolism risk  7. Fluid overload: Check daily weights. Continue lasix bid.  8. DM type 2: Continue lantus 10 unit. CBG checks AC/HS with SSI for BS control to help with wound healing. Continue to hold amaryl. F/u PCP 9. Constipation: reports poor appetite with nausea.. Augment bowel program 10.  Insomnia restoril  LOS (Days) 13 A FACE TO FACE EVALUATION WAS PERFORMED  KIRSTEINS,ANDREW E 09/23/2012, 9:35 AM

## 2012-09-23 NOTE — Progress Notes (Signed)
Occupational Therapy Session Note  Patient Details  Name: [name] MRN: [id number] Date of Birth: 08/30/1934  Today's Date: 09/23/2012 Time: 0830-0930 Time Calculation (min): 60 min  Short Term Goals: Week 2:  OT Short Term Goal 1 (Week 2): STGs = LTGs due to remaining LOS  Skilled Therapeutic Interventions/Progress Updates:    Pt completed ADL retraining at overall supervision-modified independent level.  Engaged in bathing at walk-in shower level with supervision with transfer.  Pt completed UB and LB dressing this session with use of pullup depends for increased similarity to typical underwear.  Pt with 1 LOB backwards when standing to pull up pants, with use of bed to stabilize and regain balance.  Pt's husband present at end of session for family education and reports understanding of safety with mobility and functional transfers and no further questions or concerns.  Educated pt on [facility] and strengthening exercises with theraputty and provided pt with putty and handout with printed exercises as pt continues to voice concerns with handwriting and Rt hand strength.  Therapy Documentation Precautions:  Precautions Precautions: Sternal Precaution Comments: DM; becomes clammy and diaphoretic with activity.  Fall to R and posterior Restrictions Weight Bearing Restrictions: No Pain: Pain Assessment Pain Assessment: No/denies pain Pain Score: 0-No pain ADL: ADL Eating: Independent Where Assessed-Eating: Chair Grooming: Independent Where Assessed-Grooming: Sitting at sink Upper Body Bathing: Supervision/safety Where Assessed-Upper Body Bathing: Shower Lower Body Bathing: Supervision/safety Where Assessed-Lower Body Bathing: Shower Upper Body Dressing: Modified independent (Device) Where Assessed-Upper Body Dressing: Edge of bed Lower Body Dressing: Modified independent Where Assessed-Lower Body Dressing: Edge of bed Toileting: Modified independent Toilet Transfer:  Modified independent Armed forces technical officer Method: Counselling psychologist: Bedside commode;Grab bars Tub/Shower Transfer: Close supervison Clinical cytogeneticist Method: Optometrist: Nurse, mental health Transfer: Distant supervision Social research officer, government Method: Heritage manager: Radio broadcast assistant  See FIM for current functional status  Therapy/Group: Individual Therapy  Sim Boast 09/23/2012, 12:01 PM

## 2012-09-23 NOTE — Progress Notes (Signed)
Social Work Discharge Note Discharge Note  The overall goal for the admission was met for:   Discharge location: Yes-HOME WITH HUSBAND WHO CAN PROVIDE CARE  Length of Stay: Yes-13 DAYS  Discharge activity level: Yes-SUPERVISION/MIN LEVEL  Home/community participation: Yes  Services provided included: MD, RD, PT, OT, SLP, RN, TR, Pharmacy and [place]: Private Insurance: [facility]  Follow-up services arranged: Home Health: [place], DME: [facility] [place], [facility] and Patient/Family has no preference for HH/DME agencies  Comments (or additional information):FAMILY [place]  Patient/Family verbalized understanding of follow-up arrangements: Yes  Individual responsible for coordination of the follow-up plan: BUD-HUSBAND  Confirmed correct DME delivered: [name] 09/23/2012    [name]

## 2012-09-24 NOTE — Progress Notes (Signed)
Discharge summary # [phone]

## 2012-09-25 NOTE — Discharge Summary (Signed)
[name], [name] NO.:  [id number]  MEDICAL RECORD NO.:  [id number]  LOCATION:  [id number]                         FACILITY:  [facility]  PHYSICIAN:  Charlett Blake, M.D.DATE OF BIRTH:  1934/05/12  DATE OF ADMISSION:  09/10/2012 DATE OF DISCHARGE:  09/23/2012                              DISCHARGE SUMMARY   DISCHARGE DIAGNOSES: 1. Embolic left medial frontoparietal lobe infarct. 2. Diabetes mellitus type 2. 3. Fluid overload, compensated. 4. Hypertension. 5. Acute blood loss anemia. 6. Constipation.  HISTORY OF PRESENT ILLNESS:  [name] is a 78 year old female with history of coronary artery disease, hypertension, type 2 diabetes mellitus, who was admitted on September 03, 2012, with shortness of breath on exertion as well as tightness in her chest.  Cardiac cath showed 70% stenosis of left main coronary artery with severe 3-vessel disease.  She underwent CABG x4 on September 05, 2012, by Dr. Roxy Manns.  On postop day 2, she was noted to have right-sided weakness and CT of head done, showed subacute left medial frontoparietal infarct, that was felt to be embolic due to CABG.  Neurology was consulted and recommended continuing aspirin as well as addition of subcu Lovenox for DVT prophylaxis.  Preop carotid Dopplers without ICA stenosis.  Therapies were initiated and CIR program was recommended by Therapy Team.  PAST MEDICAL HISTORY: 1. History of breast cancer, treated with XRT. 2. GERD, status post Nissen fundoplication. 3. Hypertension. 4. Headaches. 5. Thyroid disease. 6. DM type 2.  FUNCTIONAL HISTORY:  The patient was independent and active prior to admission.  FUNCTIONAL STATUS:  The patient was +2 total assist 10-20% for bed mobility.  Transfers and ambulation not tested.  She required mod assist for bathing tasks and max-to-total assist for lower body dressing.  She was limited by tachycardia and nausea in her ADL tasks.  RECENT LABS:   Check of lytes revealed sodium 134, potassium 3.9, chloride 95, CO2 29, BUN 16, creatinine 0.75, glucose 124.  LFTs revealed alkaline phosphatase 101, albumin 2.7, AST 23, ALT 14, total protein 7.4.  CBC revealed hemoglobin 11.8, hematocrit 35.1, white count 10.2, platelets 324.  HOSPITAL COURSE:  [name] was admitted to Rehab on September 10, 2012, for inpatient therapies to consist of PT, OT at least 3 hours 5 days a week.  Past admission, physiatrist, rehab RN, and therapy team have worked together to provide customized collaborative interdisciplinary care.  Rehab RN has worked with the patient on bowel and bladder program as well as wound care monitoring.  The patient's p.o. intake was monitored and has been good.  Blood pressures were checked on b.i.d. basis and had been reasonably controlled ranging from [id number] systolic.  Daily weights were done with close monitoring for evidence of fluid overload.  Her respiratory status has been stable. Weight at time of discharge is at 93.2 kg.  The patient's acute blood loss anemia is noted to be improving.  Diabetes was monitored with before meals and at bedtime CBG checks.  Amaryl was resumed.  The patient was educated about need for insulin.  Her Lantus dose is slowly being titrated off.  Blood sugars at time of discharge are ranging from 77-130  range.  Lantus was decreased to 10 units per day at bedtime.  The patient is to follow up with Dr. Doy Hutching in 1 week for posthospital check and for input regarding further titration or discontinue of Lantus.  She has been educated about need to check blood sugars on b.i.d. to t.i.d. basis for now.  During the patient's stay in Rehab, team conferences were held to monitor the patient's progress, set goals, as well as discuss barriers to discharge.  Physical Therapy has been working on balance, strengthening as well as mobility.  The patient is modified independent for transfers as well as  mobility.  She is showing improvement in strength and functional use of right upper extremity.  The patient is modified independent for dressing of upper and lower body.  She does require supervision for upper and lower body bathing.  She continues to have decrease in right hand strength and hand writing deficits.  She was educated on fine motor movement as well as strengthening exercises. Further followup home health PT, OT to continue past-discharge.  On September 23, 2012, the patient is discharged to home.  DISCHARGE MEDICATIONS: 1. Gabapentin 100 mg p.o. at bedtime. 2. Amaryl 4 mg p.o. per day. 3. OxyIR 5 mg 1 p.o. q.8 hours p.r.n. pain, #30 Rx. 4. MiraLAX 17 g in 8 ounces p.o. b.i.d. 5. Catapres-TTS 2 patch, change weekly on Mondays. 6. Lantus insulin 20 units subcu at bedtime. 7. Metoprolol 50 mg p.o. per day. 8. Tramadol 50 mg p.o. q.4 hours p.r.n. mild pain. 9. Coated aspirin 325 mg p.o. per day. 10.Lasix 40 mg p.o. b.i.d. 11.Levothyroxine 125 mcg p.o. per day. 12.Protonix 40 mg a day. 13.K-Dur 20 mEq p.o. b.i.d.  DIET:  Diabetic diet.  ACTIVITY:  No lifting, driving, or strenuous activity until cleared by Dr. Roxy Manns.  Continue sternal precautions.  SPECIAL INSTRUCTIONS:  No alcohol.  Check blood sugars 2-3 times a day and record.  Discussed Lantus taper with Dr. Doy Hutching.  [facility] to provide PT, OT, and RN.  FOLLOWUP:  The patient to follow up with Dr. Alysia Penna on October 28, 2012, at 1:45 p.m.; follow up with Dr. Fulton Reek on October 04, 2012, at 11 a.m.; follow up with Dr. Darylene Price for postop check.     Reesa Chew, P.A.   ______________________________ Charlett Blake, M.D.    PL/MEDQ  D:  09/24/2012  T:  09/25/2012  Job:  [id number]  cc:   Valentina Gu. Roxy Manns, M.D. Dr. Fulton Reek

## 2012-10-02 HISTORY — PX: CARDIAC CATHETERIZATION: SHX172

## 2012-10-09 ENCOUNTER — Other Ambulatory Visit: Payer: Self-pay | Admitting: *Deleted

## 2012-10-09 DIAGNOSIS — I2510 Atherosclerotic heart disease of native coronary artery without angina pectoris: Secondary | ICD-10-CM

## 2012-10-14 ENCOUNTER — Encounter: Payer: Self-pay | Admitting: Thoracic Surgery (Cardiothoracic Vascular Surgery)

## 2012-10-14 ENCOUNTER — Ambulatory Visit
Admission: RE | Admit: 2012-10-14 | Discharge: 2012-10-14 | Disposition: A | Discharge status: Home / Self Care | Payer: Medicare Other | Source: Ambulatory Visit | Attending: Thoracic Surgery (Cardiothoracic Vascular Surgery) | Admitting: Thoracic Surgery (Cardiothoracic Vascular Surgery)

## 2012-10-14 ENCOUNTER — Ambulatory Visit (INDEPENDENT_AMBULATORY_CARE_PROVIDER_SITE_OTHER): Payer: Self-pay | Admitting: Thoracic Surgery (Cardiothoracic Vascular Surgery)

## 2012-10-14 VITALS — BP 139/86 | HR 106 | Resp 20 | Ht 65.0 in | Wt 200.0 lb

## 2012-10-14 DIAGNOSIS — Z951 Presence of aortocoronary bypass graft: Secondary | ICD-10-CM

## 2012-10-14 DIAGNOSIS — I2510 Atherosclerotic heart disease of native coronary artery without angina pectoris: Secondary | ICD-10-CM

## 2012-10-14 NOTE — Patient Instructions (Signed)
The patient should continue to avoid any heavy lifting or strenuous use of arms or shoulders for at least a total of three months from the time of surgery.  The patient is encouraged to enroll in the outpatient cardiac rehab program.  The patient should make every effort to keep their diabetes under very tight control.  They should follow up closely with their primary care physician or endocrinologist and strive to keep their hemoglobin A1c levels as low as possible, preferably near or below 6.0  From a surgical standpoint the patient may return to driving an automobile as long as they are no longer requiring oral narcotic pain relievers during the daytime. However, I think she should also be cleared for driving by her neurologist (or Dr Letta Pate or Dr Doy Hutching) because of her stroke.  It would be wise to start driving only short distances during the daylight and gradually increase from there as they feel comfortable.

## 2012-10-14 NOTE — Progress Notes (Signed)
[address]            [place] [address]          [phone]     CARDIOTHORACIC SURGERY OFFICE NOTE  Referring Provider is Minna Merritts, MD PCP is Fulton Reek D, MD   HPI:  Patient returns for routine followup status post coronary artery bypass grafting x4 on 09/05/2012. Her early postoperative recovery was notable for the development of a left frontoparietal embolic stroke on the second postoperative day causing severe right hemiparesis and dysarthria.  She otherwise did very well from a cardiovascular standpoint, and she was initially discharged to the inpatient rehabilitation service where she continued to recover remarkably quickly from her stroke. Since then she has gone home where she has continued to improve very rapidly. She is ambulating without any difficulty and without using any assistance. She still has mild weakness and numbness in her right hand but she is already able to write using a pencil or pen without difficulty.  Her speech is normal. She has not had any significant pain in her chest and she has not been using any sort of pain relievers. She states that she is already back doing housework. She did have some vague discomfort in her throat although she was working doing housework last week that was very transient of unclear significance. She asks whether or not this might be angina pectoris. She has not had any shortness of breath.  She has not and rolled in the cardiac rehabilitation program nor does she seem to be inclined to do so. She has not yet been seen in followup by Dr. Rockey Situ although she has been seen by her primary care physician, Dr. Doy Hutching. She reports that her blood sugars have been under good control.     Current Outpatient Prescriptions  Medication Sig Dispense Refill  . aspirin 325 MG EC tablet Take 325 mg by mouth daily.      . cloNIDine (CATAPRES - DOSED IN MG/24 HR) 0.2 mg/24hr patch Place 1 patch (0.2 mg  total) onto the skin once a week. Change patch weekly on tuesdays  4 patch  1  . furosemide (LASIX) 40 MG tablet Take 1 tablet (40 mg total) by mouth 2 (two) times daily.  60 tablet  1  . gabapentin (NEURONTIN) 100 MG capsule Take 1 capsule (100 mg total) by mouth at bedtime.  30 capsule  1  . glimepiride (AMARYL) 4 MG tablet Take 1 tablet (4 mg total) by mouth daily with breakfast.  30 tablet  1  . insulin glargine (LANTUS) 100 UNIT/ML injection Inject 20 Units into the skin at bedtime.  6 mL  1  . levothyroxine (SYNTHROID, LEVOTHROID) 125 MCG tablet Take 125 mcg by mouth daily.      . metoprolol tartrate (LOPRESSOR) 50 MG tablet Take 1 tablet (50 mg total) by mouth 2 (two) times daily. Note change in dosage of medication      . oxyCODONE (OXY IR/ROXICODONE) 5 MG immediate release tablet Take 1 tablet (5 mg total) by mouth every 8 (eight) hours as needed.  30 tablet  0  . potassium chloride SA (K-DUR,KLOR-CON) 20 MEQ tablet Take 1 tablet (20 mEq total) by mouth 2 (two) times daily.  60 tablet  1  . traMADol (ULTRAM) 50 MG tablet Take 1 tablet (50 mg total) by mouth every 4 (four) hours as needed  for pain.  75 tablet  0      Physical Exam:   BP 139/86  Pulse 106  Resp 20  Ht 5\' 5"  (1.651 m)  Wt 200 lb (90.719 kg)  BMI 33.28 kg/m2  SpO2 97%  General:  Well-appearing  Chest:   Could auscultation with symmetrical breath sounds  CV:   Regular rate and rhythm without murmur  Incisions:  Completely healed, sternum is stable  Abdomen:  Soft and nontender  Extremities:  Warm and well-perfused  Diagnostic Tests:  *RADIOLOGY REPORT*  Clinical Data: CABG in December 2013, follow-up, former smoking  history  CHEST - 2 VIEW  Comparison: Portable chest x-ray of 09/08/2012 and two-view chest x-  ray of 09/03/2012  Findings: No active infiltrate or effusion is seen. Minimal  peribronchial thickening is noted. Cardiomegaly is stable. Median  sternotomy sutures are noted from CABG. There is a  slight thoracic  kyphosis present.  IMPRESSION:  No active lung disease. Interval CABG.  Original Report Authenticated By: Ivar Drape, M.D.    Impression:  The patient seems to be doing exceptionally well less than 6 weeks following coronary artery bypass grafting despite the fact that she suffered an embolic stroke on the second postoperative day. She has recovered from the stroke remarkably well and overall seems to be progressing quite nicely.  Plan:  I've encouraged the patient to consider enrolling an outpatient cardiac rehabilitation program. I think she might benefit from this. From a surgical standpoint she could resume driving an automobile, but I think she should be cleared by her neurologist or primary care physician given the fact that she just had a stroke. I've reminded her to refrain from any heavy lifting or strenuous use of her arms her shoulders. I've also reminded her to keep her appointment scheduled with Dr. Rockey Situ later this month. We have not made any changes to her current medications. All of her questions been addressed.   Valentina Gu. Roxy Manns, MD 10/14/2012 1:01 PM

## 2012-10-16 ENCOUNTER — Encounter: Payer: Medicare Other | Admitting: Nurse Practitioner

## 2012-10-22 ENCOUNTER — Encounter: Payer: Self-pay | Admitting: Cardiovascular Disease

## 2012-10-22 ENCOUNTER — Ambulatory Visit (INDEPENDENT_AMBULATORY_CARE_PROVIDER_SITE_OTHER): Payer: Medicare Other | Admitting: Cardiovascular Disease

## 2012-10-22 VITALS — BP 158/82 | HR 84 | Ht 65.0 in | Wt 204.5 lb

## 2012-10-22 DIAGNOSIS — E785 Hyperlipidemia, unspecified: Secondary | ICD-10-CM

## 2012-10-22 DIAGNOSIS — I6340 Cerebral infarction due to embolism of unspecified cerebral artery: Secondary | ICD-10-CM

## 2012-10-22 DIAGNOSIS — I10 Essential (primary) hypertension: Secondary | ICD-10-CM

## 2012-10-22 DIAGNOSIS — Z951 Presence of aortocoronary bypass graft: Secondary | ICD-10-CM

## 2012-10-22 DIAGNOSIS — I2510 Atherosclerotic heart disease of native coronary artery without angina pectoris: Secondary | ICD-10-CM

## 2012-10-22 MED ORDER — CLONIDINE HCL 0.2 MG PO TABS: 0.2000 mg | ORAL_TABLET | Freq: Two times a day (BID) | ORAL | Status: DC

## 2012-10-22 MED ORDER — METOPROLOL TARTRATE 50 MG PO TABS: 50.0000 mg | ORAL_TABLET | Freq: Two times a day (BID) | ORAL | Status: DC

## 2012-10-22 MED ORDER — POTASSIUM CHLORIDE CRYS ER 20 MEQ PO TBCR: 20.0000 meq | EXTENDED_RELEASE_TABLET | Freq: Two times a day (BID) | ORAL | Status: DC

## 2012-10-22 MED ORDER — FUROSEMIDE 40 MG PO TABS: 40.0000 mg | ORAL_TABLET | Freq: Every day | ORAL | Status: DC

## 2012-10-22 NOTE — Assessment & Plan Note (Signed)
Blood pressure is well controlled on today's visit. No changes made to the medications. Heart rate is up slightly today. We will monitor this and adjust the Toprol as needed.

## 2012-10-22 NOTE — Progress Notes (Signed)
Patient ID: [name], female    DOB: [birth year], 79 y.o.   MRN: [id number]  HPI Comments: [name] is a very pleasant 58 short woman with coronary artery disease, cardiac catheterization at [facility] 08/22/2012 showing severe distal left main, ostial LAD and circumflex disease also with RCA disease transferred to [facility] for a CABG x4, postsurgical stroke with right-sided deficits who presents for followup today.  She reports that she has had a dramatic recovery from her postsurgical stroke. She has a strange feeling in her right hand otherwise has relatively good right leg and arm strength. Balance is adequate but slow. Overall she is in good spirits. He does report losing 30 pounds during her hospital course. Sugar levels at home have been low. She continues on insulin. She does report sugars in the morning sometimes as low as 60. She wonders if she should decrease her insulin dosing which is 20 units every night. Femara was stopped while she was in the hospital on the uncertainty that this could have contributed to her TIA/stroke. She does report having some tightness in her neck with neck pain on looking upwards. She denies any significant lower extremity edema, no shortness of breath with exertion.  Current cardiac catheterization report 08/22/2012 showed distal left main 70% disease, ostial LAD of 80%, proximal LAD 70%, distal LAD diffuse 70%, diagonal #150% followed by discrete 95% lesion, ostial circumflex 90% disease, proximal circumflex 70% disease, proximal RCA 50%, distal RCA 60% PDA branch 60% EKG today shows normal sinus rhythm with rate 84 beats per minute with left axis deviation, low voltage  No recent lab values for cholesterol. She does not want a cholesterol medication as she reports having tried these before and had significant muscle aches and myalgias.     Outpatient Encounter Prescriptions as of 10/22/2012  Medication Sig Dispense Refill  . aspirin 325 MG EC tablet Take  325 mg by mouth daily.      . cloNIDine (CATAPRES - DOSED IN MG/24 HR) 0.2 mg/24hr patch Place 1 patch (0.2 mg total) onto the skin once a week. Change patch weekly on tuesdays  4 patch  1  . furosemide (LASIX) 40 MG tablet Take 1 tablet (40 mg total) by mouth twice a day   90 tablet  3  . gabapentin (NEURONTIN) 100 MG capsule Take 1 capsule (100 mg total) by mouth at bedtime.  30 capsule  1  . glimepiride (AMARYL) 4 MG tablet Take 1 tablet (4 mg total) by mouth daily with breakfast.  30 tablet  1  . insulin glargine (LANTUS) 100 UNIT/ML injection Inject 20 Units into the skin at bedtime.  6 mL  1  . levothyroxine (SYNTHROID, LEVOTHROID) 125 MCG tablet Take 125 mcg by mouth daily.      . metoprolol (LOPRESSOR) 50 MG tablet Take 1 tablet (50 mg total) by mouth 2 (two) times daily. Note change in dosage of medication  180 tablet  3  . oxyCODONE (OXY IR/ROXICODONE) 5 MG immediate release tablet Take 1 tablet (5 mg total) by mouth every 8 (eight) hours as needed.  30 tablet  0  . potassium chloride SA (K-DUR,KLOR-CON) 20 MEQ tablet Take 1 tablet (20 mEq total) by mouth 2 (two) times daily.  90 tablet  3  . traMADol (ULTRAM) 50 MG tablet Take 1 tablet (50 mg total) by mouth every 4 (four) hours as needed for pain.  75 tablet  0   ]  Review of Systems  Constitutional: Negative.  HENT: Negative.   Eyes: Negative.   Respiratory: Negative.   Cardiovascular: Negative.   Gastrointestinal: Negative.   Musculoskeletal: Positive for gait problem.  Skin: Negative.   Neurological: Negative.   Hematological: Negative.   Psychiatric/Behavioral: Negative.   All other systems reviewed and are negative.    BP 158/82  Pulse 84  Ht 5\' 5"  (1.651 m)  Wt 204 lb 8 oz (92.761 kg)  BMI 34.03 kg/m2  Physical Exam  Nursing note and vitals reviewed. Constitutional: She is oriented to person, place, and time. She appears well-developed and well-nourished.  HENT:  Head: Normocephalic.  Nose: Nose normal.    Mouth/Throat: Oropharynx is clear and moist.  Eyes: Conjunctivae normal are normal. Pupils are equal, round, and reactive to light.  Neck: Normal range of motion. Neck supple. No JVD present.  Cardiovascular: Normal rate, regular rhythm, S1 normal, S2 normal, normal heart sounds and intact distal pulses.  Exam reveals no gallop and no friction rub.   No murmur heard.      Well healed mediastinal incision  Pulmonary/Chest: Effort normal and breath sounds normal. No respiratory distress. She has no wheezes. She has no rales. She exhibits no tenderness.  Abdominal: Soft. Bowel sounds are normal. She exhibits no distension. There is no tenderness.  Musculoskeletal: Normal range of motion. She exhibits no edema and no tenderness.  Lymphadenopathy:    She has no cervical adenopathy.  Neurological: She is alert and oriented to person, place, and time. Coordination normal.  Skin: Skin is warm and dry. No rash noted. No erythema.  Psychiatric: She has a normal mood and affect. Her behavior is normal. Judgment and thought content normal.         Assessment and Plan

## 2012-10-22 NOTE — Assessment & Plan Note (Signed)
Recovered well. We'll decrease her Lasix to 40 mg daily. No signs of heart failure, lungs are clear. She may only need Lasix every other day or as needed. We'll decrease it slowly and followup.

## 2012-10-22 NOTE — Patient Instructions (Addendum)
You are doing well. Decrease lasix/furosemide down to one a day When the clonidine patch runs out,  Change to clonidine pill twice a day  Please call us if you have new issues that need to be addressed before your next appt.  Your physician wants you to follow-up in: 1 to 2  months.

## 2012-10-22 NOTE — Assessment & Plan Note (Signed)
Overall doing well. She does report having one episode of neck pain since surgery which was her typical anginal pain. We'll watch her closely and have suggested she call our office for worsening anginal-type symptoms.

## 2012-10-22 NOTE — Assessment & Plan Note (Signed)
Recovering well following her stroke. Normal residual deficits on the right

## 2012-10-22 NOTE — Assessment & Plan Note (Signed)
History of hyperlipidemia. She does not want a cholesterol medication. On her next visit, we will discuss other options such as WelChol, fenofibrate, zetia.

## 2012-10-28 ENCOUNTER — Inpatient Hospital Stay: Payer: Medicare Other | Admitting: Physical Medicine & Rehabilitation

## 2012-10-28 ENCOUNTER — Telehealth: Payer: Self-pay

## 2012-10-28 NOTE — Telephone Encounter (Signed)
Pt called to ask if ok to resume driving I asked Dr. Rockey Situ who says ok to resume Pt informed Understanding verb

## 2012-11-21 ENCOUNTER — Ambulatory Visit: Payer: Self-pay | Admitting: Radiation Oncology

## 2012-11-30 ENCOUNTER — Ambulatory Visit: Payer: Self-pay | Admitting: Oncology

## 2012-11-30 ENCOUNTER — Ambulatory Visit: Payer: Self-pay | Admitting: Radiation Oncology

## 2012-12-23 ENCOUNTER — Ambulatory Visit: Payer: Medicare Other | Admitting: Cardiovascular Disease

## 2013-01-15 ENCOUNTER — Encounter: Payer: Self-pay | Admitting: Cardiovascular Disease

## 2013-01-15 ENCOUNTER — Ambulatory Visit (INDEPENDENT_AMBULATORY_CARE_PROVIDER_SITE_OTHER): Payer: Medicare Other | Admitting: Cardiovascular Disease

## 2013-01-15 VITALS — BP 122/60 | HR 62 | Ht 65.0 in | Wt 200.5 lb

## 2013-01-15 DIAGNOSIS — E119 Type 2 diabetes mellitus without complications: Secondary | ICD-10-CM

## 2013-01-15 DIAGNOSIS — I6340 Cerebral infarction due to embolism of unspecified cerebral artery: Secondary | ICD-10-CM

## 2013-01-15 DIAGNOSIS — I10 Essential (primary) hypertension: Secondary | ICD-10-CM

## 2013-01-15 DIAGNOSIS — E785 Hyperlipidemia, unspecified: Secondary | ICD-10-CM

## 2013-01-15 DIAGNOSIS — I2510 Atherosclerotic heart disease of native coronary artery without angina pectoris: Secondary | ICD-10-CM

## 2013-01-15 NOTE — Patient Instructions (Addendum)
You are doing well. No medication changes were made.  Please call us if you have new issues that need to be addressed before your next appt.  Your physician wants you to follow-up in: 6 months.  You will receive a reminder letter in the mail two months in advance. If you don't receive a letter, please call our office to schedule the follow-up appointment.   

## 2013-01-15 NOTE — Assessment & Plan Note (Signed)
Blood pressure is well controlled on today's visit. No changes made to the medications. 

## 2013-01-15 NOTE — Assessment & Plan Note (Signed)
Recent weight loss of 37 pounds over the past year. Encouraged her to continue to watch her diet, increase her walking as tolerated.

## 2013-01-15 NOTE — Assessment & Plan Note (Signed)
Recovered well from her stroke. Minimal residual deficits.

## 2013-01-15 NOTE — Assessment & Plan Note (Signed)
She does not want a cholesterol medication. She does not even want over-the-counter red yeast rice.

## 2013-01-15 NOTE — Progress Notes (Signed)
Patient ID: [name], female    DOB: 31-Mar-1934, 79 y.o.   MRN: [id number]  HPI Comments: [name] is a very pleasant 79 yo woman with coronary artery disease, cardiac catheterization at [facility] 08/22/2012 showing severe distal left main, ostial LAD and circumflex disease also with RCA disease transferred to [facility] for a CABG x4, postsurgical stroke with right-sided deficits who presents for followup today.   dramatic recovery from her postsurgical stroke. Recently had bronchitis requiring antibiotics and prednisone. Now feeling better though still weak. Overall she is in good spirits. He does report losing 37 pounds over the past year. Cholesterol improved by her report from 189 down to 170. She does not want a cholesterol medication. She denies any significant lower extremity edema, no shortness of breath with exertion.  Current cardiac catheterization report 08/22/2012 showed distal left main 70% disease, ostial LAD of 80%, proximal LAD 70%, distal LAD diffuse 70%, diagonal #150% followed by discrete 95% lesion, ostial circumflex 90% disease, proximal circumflex 70% disease, proximal RCA 50%, distal RCA 60% PDA branch 60%  EKG today shows normal sinus rhythm with rate 62 beats per minute with left axis deviation, low voltage    Outpatient Encounter Prescriptions as of 01/15/2013  Medication Sig Dispense Refill  . aspirin 325 MG EC tablet Take 325 mg by mouth daily.      . furosemide (LASIX) 40 MG tablet Take 1 tablet (40 mg total) by mouth daily.  90 tablet  3  . gabapentin (NEURONTIN) 100 MG capsule Take 1 capsule (100 mg total) by mouth at bedtime.  30 capsule  1  . glimepiride (AMARYL) 4 MG tablet Take 1 tablet (4 mg total) by mouth daily with breakfast.  30 tablet  1  . levothyroxine (SYNTHROID, LEVOTHROID) 125 MCG tablet Take 125 mcg by mouth daily.      . metoprolol (LOPRESSOR) 50 MG tablet Take 1 tablet (50 mg total) by mouth 2 (two) times daily. Note change in dosage of  medication  180 tablet  3  . oxyCODONE (OXY IR/ROXICODONE) 5 MG immediate release tablet Take 1 tablet (5 mg total) by mouth every 8 (eight) hours as needed.  30 tablet  0  . potassium chloride SA (K-DUR,KLOR-CON) 20 MEQ tablet Take 1 tablet (20 mEq total) by mouth 2 (two) times daily.  90 tablet  3  . traMADol (ULTRAM) 50 MG tablet Take 1 tablet (50 mg total) by mouth every 4 (four) hours as needed for pain.  75 tablet  0  . [DISCONTINUED] cloNIDine (CATAPRES - DOSED IN MG/24 HR) 0.2 mg/24hr patch Place 1 patch (0.2 mg total) onto the skin once a week. Change patch weekly on tuesdays  4 patch  1  . [DISCONTINUED] cloNIDine (CATAPRES) 0.2 MG tablet Take 1 tablet (0.2 mg total) by mouth 2 (two) times daily.  180 tablet  3  . [DISCONTINUED] insulin glargine (LANTUS) 100 UNIT/ML injection Inject 20 Units into the skin at bedtime.  6 mL  1   No facility-administered encounter medications on file as of 01/15/2013.    Review of Systems  Constitutional: Negative.   HENT: Negative.   Eyes: Negative.   Respiratory: Negative.   Cardiovascular: Negative.   Gastrointestinal: Negative.   Musculoskeletal: Positive for gait problem.  Skin: Negative.   Neurological: Negative.   Psychiatric/Behavioral: Negative.   All other systems reviewed and are negative.    BP 122/60  Pulse 62  Ht 5\' 5"  (1.651 m)  Wt 200 lb 8  oz (90.946 kg)  BMI 33.36 kg/m2  Physical Exam  Nursing note and vitals reviewed. Constitutional: She is oriented to person, place, and time. She appears well-developed and well-nourished.  HENT:  Head: Normocephalic.  Nose: Nose normal.  Mouth/Throat: Oropharynx is clear and moist.  Eyes: Conjunctivae are normal. Pupils are equal, round, and reactive to light.  Neck: Normal range of motion. Neck supple. No JVD present.  Cardiovascular: Normal rate, regular rhythm, S1 normal, S2 normal, normal heart sounds and intact distal pulses.  Exam reveals no gallop and no friction rub.   No  murmur heard. Well healed mediastinal incision  Pulmonary/Chest: Effort normal and breath sounds normal. No respiratory distress. She has no wheezes. She has no rales. She exhibits no tenderness.  Abdominal: Soft. Bowel sounds are normal. She exhibits no distension. There is no tenderness.  Musculoskeletal: Normal range of motion. She exhibits no edema and no tenderness.  Lymphadenopathy:    She has no cervical adenopathy.  Neurological: She is alert and oriented to person, place, and time. Coordination normal.  Skin: Skin is warm and dry. No rash noted. No erythema.  Psychiatric: She has a normal mood and affect. Her behavior is normal. Judgment and thought content normal.    Assessment and Plan

## 2013-01-15 NOTE — Assessment & Plan Note (Signed)
Currently with no symptoms of angina. No further workup at this time. Continue current medication regimen. 

## 2013-01-21 ENCOUNTER — Ambulatory Visit: Payer: Self-pay | Admitting: Internal Medicine

## 2013-04-21 ENCOUNTER — Ambulatory Visit: Payer: Self-pay | Admitting: Internal Medicine

## 2013-06-30 ENCOUNTER — Ambulatory Visit: Payer: Self-pay | Admitting: Surgery

## 2013-07-14 ENCOUNTER — Encounter: Payer: Self-pay | Admitting: Cardiovascular Disease

## 2013-07-14 ENCOUNTER — Ambulatory Visit (INDEPENDENT_AMBULATORY_CARE_PROVIDER_SITE_OTHER): Payer: Medicare Other | Admitting: Cardiovascular Disease

## 2013-07-14 VITALS — BP 142/82 | HR 85 | Ht 65.0 in | Wt 209.5 lb

## 2013-07-14 DIAGNOSIS — I2510 Atherosclerotic heart disease of native coronary artery without angina pectoris: Secondary | ICD-10-CM

## 2013-07-14 DIAGNOSIS — I10 Essential (primary) hypertension: Secondary | ICD-10-CM

## 2013-07-14 DIAGNOSIS — E119 Type 2 diabetes mellitus without complications: Secondary | ICD-10-CM

## 2013-07-14 DIAGNOSIS — E785 Hyperlipidemia, unspecified: Secondary | ICD-10-CM

## 2013-07-14 DIAGNOSIS — Z951 Presence of aortocoronary bypass graft: Secondary | ICD-10-CM

## 2013-07-14 DIAGNOSIS — R0602 Shortness of breath: Secondary | ICD-10-CM

## 2013-07-14 NOTE — Assessment & Plan Note (Signed)
Blood pressure is well controlled on today's visit. No changes made to the medications. 

## 2013-07-14 NOTE — Assessment & Plan Note (Signed)
We have encouraged continued exercise, careful diet management in an effort to lose weight. 

## 2013-07-14 NOTE — Assessment & Plan Note (Signed)
Shortness of breath seems to have improved after bypass surgery. Encouraged a regular walking program. She is limited by her arthritis

## 2013-07-14 NOTE — Patient Instructions (Addendum)
You are doing well. No medication changes were made.   Ask Dr. Doy Hutching a bout taking a low dose of lisinopril Increase your fiber intake for cholesterol  Ask Dr. Doy Hutching about starting either zetia, fenofibrate, or welchol.  Goal total cholesterol is <150  Please call us if you have new issues that need to be addressed before your next appt.  Your physician wants you to follow-up in: 6 months.  You will receive a reminder letter in the mail two months in advance. If you don't receive a letter, please call our office to schedule the follow-up appointment.

## 2013-07-14 NOTE — Progress Notes (Signed)
Patient ID: [name], female    DOB: April 12, 1934, 79 y.o.   MRN: [id number]  HPI Comments: [name] is a very pleasant 79 yo woman with coronary artery disease, cardiac catheterization at [facility] 08/22/2012 showing severe distal left main, ostial LAD and circumflex disease also with RCA disease transferred to [facility] for a CABG x4, postsurgical stroke with right-sided deficits who presents for followup today.   dramatic recovery from her postsurgical stroke. On her prior clinic visit, she had bronchitis requiring antibiotics and prednisone. She made a slow recovery Overall she is in good spirits. She lost significant weight through the perioperative period but has put some of that weight back on.  She does not want a cholesterol medication. She reports that she has tried many medications and they have side effects She denies any significant lower extremity edema, no shortness of breath with exertion. Most recent cholesterol was 210, LDL 111, hemoglobin A1c 6.6  Current cardiac catheterization report 08/22/2012 showed distal left main 70% disease, ostial LAD of 80%, proximal LAD 70%, distal LAD diffuse 70%, diagonal #150% followed by discrete 95% lesion, ostial circumflex 90% disease, proximal circumflex 70% disease, proximal RCA 50%, distal RCA 60% PDA branch 60%  EKG today shows normal sinus rhythm with rate 85 beats per minute with left axis deviation, low voltage    Outpatient Encounter Prescriptions as of 07/14/2013  Medication Sig Dispense Refill  . aspirin 325 MG EC tablet Take 325 mg by mouth daily.      . furosemide (LASIX) 40 MG tablet Take 1 tablet (40 mg total) by mouth daily.  90 tablet  3  . gabapentin (NEURONTIN) 100 MG capsule Take 1 capsule (100 mg total) by mouth at bedtime.  30 capsule  1  . glimepiride (AMARYL) 4 MG tablet Take 1 tablet (4 mg total) by mouth daily with breakfast.  30 tablet  1  . levothyroxine (SYNTHROID, LEVOTHROID) 125 MCG tablet Take 125 mcg by  mouth daily.      . metoprolol (LOPRESSOR) 50 MG tablet Take 1 tablet (50 mg total) by mouth 2 (two) times daily. Note change in dosage of medication  180 tablet  3  . oxyCODONE (OXY IR/ROXICODONE) 5 MG immediate release tablet Take 1 tablet (5 mg total) by mouth every 8 (eight) hours as needed.  30 tablet  0  . potassium chloride SA (K-DUR,KLOR-CON) 20 MEQ tablet Take 20 mEq by mouth daily.      . traMADol (ULTRAM) 50 MG tablet Take 1 tablet (50 mg total) by mouth every 4 (four) hours as needed for pain.  75 tablet  0  . [DISCONTINUED] potassium chloride SA (K-DUR,KLOR-CON) 20 MEQ tablet Take 1 tablet (20 mEq total) by mouth 2 (two) times daily.  90 tablet  3   No facility-administered encounter medications on file as of 07/14/2013.    Review of Systems  Constitutional: Negative.   HENT: Negative.   Eyes: Negative.   Respiratory: Negative.   Cardiovascular: Negative.   Gastrointestinal: Negative.   Musculoskeletal: Positive for gait problem.  Skin: Negative.   Neurological: Negative.   Psychiatric/Behavioral: Negative.   All other systems reviewed and are negative.    BP 142/82  Pulse 85  Ht 5\' 5"  (1.651 m)  Wt 209 lb 8 oz (95.029 kg)  BMI 34.86 kg/m2  Physical Exam  Nursing note and vitals reviewed. Constitutional: She is oriented to person, place, and time. She appears well-developed and well-nourished.  HENT:  Head: Normocephalic.  Nose:  Nose normal.  Mouth/Throat: Oropharynx is clear and moist.  Eyes: Conjunctivae are normal. Pupils are equal, round, and reactive to light.  Neck: Normal range of motion. Neck supple. No JVD present.  Cardiovascular: Normal rate, regular rhythm, S1 normal, S2 normal, normal heart sounds and intact distal pulses.  Exam reveals no gallop and no friction rub.   No murmur heard. Well healed mediastinal incision  Pulmonary/Chest: Effort normal and breath sounds normal. No respiratory distress. She has no wheezes. She has no rales. She  exhibits no tenderness.  Abdominal: Soft. Bowel sounds are normal. She exhibits no distension. There is no tenderness.  Musculoskeletal: Normal range of motion. She exhibits no edema and no tenderness.  Lymphadenopathy:    She has no cervical adenopathy.  Neurological: She is alert and oriented to person, place, and time. Coordination normal.  Skin: Skin is warm and dry. No rash noted. No erythema.  Psychiatric: She has a normal mood and affect. Her behavior is normal. Judgment and thought content normal.    Assessment and Plan

## 2013-07-14 NOTE — Assessment & Plan Note (Signed)
Cholesterol is 210. We have discussed this with her. Goal is 150 or less. She is reluctant to try even non-statin medications. We did mention she could try WelChol, fenofibrate, zetia or other options. She's not interested at this time.

## 2013-07-14 NOTE — Assessment & Plan Note (Signed)
Currently with no symptoms of angina. No further workup at this time. Continue current medication regimen. She does not want a cholesterol medication

## 2013-10-20 ENCOUNTER — Other Ambulatory Visit: Payer: Self-pay

## 2013-10-20 MED ORDER — METOPROLOL TARTRATE 50 MG PO TABS: 50.0000 mg | ORAL_TABLET | Freq: Two times a day (BID) | ORAL | Status: DC

## 2013-10-20 NOTE — Telephone Encounter (Signed)
Refill metoprolol tart 50 mg twice a day.

## 2013-11-10 ENCOUNTER — Other Ambulatory Visit: Payer: Self-pay | Admitting: *Deleted

## 2013-11-10 MED ORDER — CLONIDINE HCL 0.2 MG PO TABS: 0.2000 mg | ORAL_TABLET | Freq: Two times a day (BID) | ORAL | Status: DC

## 2013-11-10 NOTE — Telephone Encounter (Signed)
Requested Prescriptions   Signed Prescriptions Disp Refills  . cloNIDine (CATAPRES) 0.2 MG tablet 180 tablet 3    Sig: Take 1 tablet (0.2 mg total) by mouth 2 (two) times daily.    Authorizing Provider: Minna Merritts    Ordering User: Britt Bottom

## 2014-01-12 ENCOUNTER — Ambulatory Visit (INDEPENDENT_AMBULATORY_CARE_PROVIDER_SITE_OTHER): Payer: Medicare Other | Admitting: Cardiovascular Disease

## 2014-01-12 ENCOUNTER — Encounter: Payer: Self-pay | Admitting: Cardiovascular Disease

## 2014-01-12 VITALS — BP 142/98 | HR 94 | Ht 65.0 in | Wt 220.5 lb

## 2014-01-12 DIAGNOSIS — I2510 Atherosclerotic heart disease of native coronary artery without angina pectoris: Secondary | ICD-10-CM

## 2014-01-12 DIAGNOSIS — E785 Hyperlipidemia, unspecified: Secondary | ICD-10-CM

## 2014-01-12 DIAGNOSIS — I10 Essential (primary) hypertension: Secondary | ICD-10-CM

## 2014-01-12 DIAGNOSIS — E119 Type 2 diabetes mellitus without complications: Secondary | ICD-10-CM

## 2014-01-12 NOTE — Assessment & Plan Note (Signed)
We have encouraged continued exercise, careful diet management in an effort to lose weight. 

## 2014-01-12 NOTE — Assessment & Plan Note (Signed)
Blood pressure is well controlled on today's visit. No changes made to the medications. 

## 2014-01-12 NOTE — Progress Notes (Signed)
Patient ID: [name], female    DOB: 05-04-1934, 80 y.o.   MRN: [id number]  HPI Comments: [name] is a very pleasant 80 yo woman with coronary artery disease, cardiac catheterization at [facility] 08/22/2012 showing severe distal left main, ostial LAD and circumflex disease also with RCA disease transferred to [facility] for a CABG x4, postsurgical stroke with right-sided deficits who presents for followup today. She had postsurgical stroke has recovered well. LIMA to LAD, SVG to Diag, SVG to OM1, SVG to PDA, EVH from bilateral thighs  Overall she is in good spirits. She does not do any regular exercise. She does not want a cholesterol medication. She reports that she has tried many medications and they have side effects She denies any significant lower extremity edema, no shortness of breath with exertion. She does report having some nasal stuffiness. Previously seen by ear nose throat, Dr. Tami Ribas She reports having some personality change since the surgery, more irritable Previous cholesterol was 210, LDL 111, hemoglobin A1c 6.6  Current cardiac catheterization report 08/22/2012 showed distal left main 70% disease, ostial LAD of 80%, proximal LAD 70%, distal LAD diffuse 70%, diagonal #150% followed by discrete 95% lesion, ostial circumflex 90% disease, proximal circumflex 70% disease, proximal RCA 50%, distal RCA 60% PDA branch 60%  EKG today shows normal sinus rhythm with rate 94 beats per minute with left axis deviation, low voltage    Outpatient Encounter Prescriptions as of 01/12/2014  Medication Sig  . aspirin 325 MG EC tablet Take 325 mg by mouth daily.  . cloNIDine (CATAPRES) 0.2 MG tablet Take 1 tablet (0.2 mg total) by mouth 2 (two) times daily.  . fluticasone (FLONASE) 50 MCG/ACT nasal spray Place 1 spray into the nose daily.   . furosemide (LASIX) 40 MG tablet Take 1 tablet (40 mg total) by mouth daily.  Marland Kitchen gabapentin (NEURONTIN) 100 MG capsule Take 100 mg by mouth 2 (two)  times daily.  Marland Kitchen glimepiride (AMARYL) 4 MG tablet Take 4 mg by mouth 2 (two) times daily.  Marland Kitchen levothyroxine (SYNTHROID, LEVOTHROID) 125 MCG tablet Take 125 mcg by mouth daily.  . metoprolol (LOPRESSOR) 50 MG tablet Take 1 tablet (50 mg total) by mouth 2 (two) times daily. Note change in dosage of medication  . potassium chloride SA (K-DUR,KLOR-CON) 20 MEQ tablet Take 20 mEq by mouth daily.   Review of Systems  Constitutional: Negative.   HENT: Negative.   Eyes: Negative.   Respiratory: Negative.   Cardiovascular: Negative.   Gastrointestinal: Negative.   Endocrine: Negative.   Musculoskeletal: Positive for gait problem.  Skin: Negative.   Allergic/Immunologic: Negative.   Neurological: Negative.   Hematological: Negative.   Psychiatric/Behavioral: Negative.   All other systems reviewed and are negative.   BP 142/98  Pulse 94  Ht 5\' 5"  (1.651 m)  Wt 220 lb 8 oz (100.018 kg)  BMI 36.69 kg/m2  Physical Exam  Nursing note and vitals reviewed. Constitutional: She is oriented to person, place, and time. She appears well-developed and well-nourished.  HENT:  Head: Normocephalic.  Nose: Nose normal.  Mouth/Throat: Oropharynx is clear and moist.  Eyes: Conjunctivae are normal. Pupils are equal, round, and reactive to light.  Neck: Normal range of motion. Neck supple. No JVD present.  Cardiovascular: Normal rate, regular rhythm, S1 normal, S2 normal, normal heart sounds and intact distal pulses.  Exam reveals no gallop and no friction rub.   No murmur heard. Well healed mediastinal incision  Pulmonary/Chest: Effort normal and breath  sounds normal. No respiratory distress. She has no wheezes. She has no rales. She exhibits no tenderness.  Abdominal: Soft. Bowel sounds are normal. She exhibits no distension. There is no tenderness.  Musculoskeletal: Normal range of motion. She exhibits no edema and no tenderness.  Lymphadenopathy:    She has no cervical adenopathy.  Neurological: She  is alert and oriented to person, place, and time. Coordination normal.  Skin: Skin is warm and dry. No rash noted. No erythema.  Psychiatric: She has a normal mood and affect. Her behavior is normal. Judgment and thought content normal.    Assessment and Plan

## 2014-01-12 NOTE — Patient Instructions (Signed)
You are doing well. No medication changes were made.  Try zyrtec (cetirazine) for runny nose.   Please call us if you have new issues that need to be addressed before your next appt.  Your physician wants you to follow-up in: 6 months.  You will receive a reminder letter in the mail two months in advance. If you don't receive a letter, please call our office to schedule the follow-up appointment.

## 2014-01-12 NOTE — Assessment & Plan Note (Signed)
Currently with no symptoms of angina. No further workup at this time. Continue current medication regimen. 

## 2014-01-12 NOTE — Assessment & Plan Note (Signed)
She has indicated in the past that she does not want a statin

## 2014-01-13 ENCOUNTER — Telehealth: Payer: Self-pay

## 2014-01-13 NOTE — Telephone Encounter (Signed)
Pt has question regarding the "breathing part of her open heart surgery"...please call.

## 2014-01-13 NOTE — Telephone Encounter (Signed)
Spoke w/ pt.  She states that she was advised yesterday to avoid mowing the yard.  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 Advised her to avoid anything strenous that would aggravate her angina.  She states that they recently got a new preacher at her church and that being in the choir is quite stressful at this time.  Advised her to avoid this at this time.  She is agreeable to this and will call w/ further questions or concerns.

## 2014-05-03 ENCOUNTER — Inpatient Hospital Stay: Payer: Self-pay

## 2014-05-03 LAB — URINALYSIS, COMPLETE
Bacteria: NONE SEEN
Bilirubin,UR: NEGATIVE
Glucose,UR: NEGATIVE mg/dL (ref 0–75)
Ketone: NEGATIVE
Leukocyte Esterase: NEGATIVE
Nitrite: NEGATIVE
Ph: 5 (ref 4.5–8.0)
Protein: 30
RBC,UR: 7 /HPF (ref 0–5)
Specific Gravity: 1.015 (ref 1.003–1.030)
Squamous Epithelial: NONE SEEN
WBC UR: <1 /HPF (ref 0–5)

## 2014-05-03 LAB — COMPREHENSIVE METABOLIC PANEL
Albumin: 2.9 g/dL — ABNORMAL LOW (ref 3.4–5.0)
Alkaline Phosphatase: 109 U/L
Anion Gap: 7 (ref 7–16)
BUN: 17 mg/dL (ref 7–18)
Bilirubin,Total: 0.3 mg/dL (ref 0.2–1.0)
Calcium, Total: 8.3 mg/dL — ABNORMAL LOW (ref 8.5–10.1)
Chloride: 107 mmol/L (ref 98–107)
Co2: 26 mmol/L (ref 21–32)
Creatinine: 1.39 mg/dL — ABNORMAL HIGH (ref 0.60–1.30)
EGFR (African American): 42 — ABNORMAL LOW
EGFR (Non-African Amer.): 36 — ABNORMAL LOW
Glucose: 131 mg/dL — ABNORMAL HIGH (ref 65–99)
Osmolality: 283 (ref 275–301)
Potassium: 4.2 mmol/L (ref 3.5–5.1)
SGOT(AST): 25 U/L (ref 15–37)
SGPT (ALT): 19 U/L
Sodium: 140 mmol/L (ref 136–145)
Total Protein: 8 g/dL (ref 6.4–8.2)

## 2014-05-03 LAB — CBC WITH DIFFERENTIAL/PLATELET
Basophil #: 0.1 10*3/uL (ref 0.0–0.1)
Basophil %: 0.5 %
Eosinophil #: 0.2 10*3/uL (ref 0.0–0.7)
Eosinophil %: 1.3 %
HCT: 39.6 % (ref 35.0–47.0)
HGB: 13.1 g/dL (ref 12.0–16.0)
Lymphocyte #: 1 10*3/uL (ref 1.0–3.6)
Lymphocyte %: 6.9 %
MCH: 29.2 pg (ref 26.0–34.0)
MCHC: 33.2 g/dL (ref 32.0–36.0)
MCV: 88 fL (ref 80–100)
Monocyte #: 0.6 x10 3/mm (ref 0.2–0.9)
Monocyte %: 4.6 %
Neutrophil #: 12.3 10*3/uL — ABNORMAL HIGH (ref 1.4–6.5)
Neutrophil %: 86.7 %
Platelet: 184 10*3/uL (ref 150–440)
RBC: 4.51 10*6/uL (ref 3.80–5.20)
RDW: 14.4 % (ref 11.5–14.5)
WBC: 14.2 10*3/uL — ABNORMAL HIGH (ref 3.6–11.0)

## 2014-05-03 LAB — TROPONIN I: Troponin-I: <0.02 ng/mL

## 2014-05-04 LAB — CBC WITH DIFFERENTIAL/PLATELET
Basophil #: 0 10*3/uL (ref 0.0–0.1)
Basophil %: 0.1 %
Eosinophil #: 0 10*3/uL (ref 0.0–0.7)
Eosinophil %: 0 %
HCT: 35 % (ref 35.0–47.0)
HGB: 11.8 g/dL — ABNORMAL LOW (ref 12.0–16.0)
Lymphocyte #: 0.7 10*3/uL — ABNORMAL LOW (ref 1.0–3.6)
Lymphocyte %: 3.2 %
MCH: 29.5 pg (ref 26.0–34.0)
MCHC: 33.6 g/dL (ref 32.0–36.0)
MCV: 88 fL (ref 80–100)
Monocyte #: 0.2 x10 3/mm (ref 0.2–0.9)
Monocyte %: 1.1 %
Neutrophil #: 20.7 10*3/uL — ABNORMAL HIGH (ref 1.4–6.5)
Neutrophil %: 95.6 %
Platelet: 169 10*3/uL (ref 150–440)
RBC: 3.99 10*6/uL (ref 3.80–5.20)
RDW: 14.4 % (ref 11.5–14.5)
WBC: 21.6 10*3/uL — ABNORMAL HIGH (ref 3.6–11.0)

## 2014-05-04 LAB — BASIC METABOLIC PANEL
Anion Gap: 2 — ABNORMAL LOW (ref 7–16)
BUN: 17 mg/dL (ref 7–18)
Calcium, Total: 7.9 mg/dL — ABNORMAL LOW (ref 8.5–10.1)
Chloride: 108 mmol/L — ABNORMAL HIGH (ref 98–107)
Co2: 26 mmol/L (ref 21–32)
Creatinine: 1.44 mg/dL — ABNORMAL HIGH (ref 0.60–1.30)
EGFR (African American): 40 — ABNORMAL LOW
EGFR (Non-African Amer.): 35 — ABNORMAL LOW
Glucose: 189 mg/dL — ABNORMAL HIGH (ref 65–99)
Osmolality: 279 (ref 275–301)
Potassium: 3.9 mmol/L (ref 3.5–5.1)
Sodium: 136 mmol/L (ref 136–145)

## 2014-05-04 LAB — URINE CULTURE

## 2014-05-05 LAB — CBC WITH DIFFERENTIAL/PLATELET
Basophil #: 0.1 10*3/uL (ref 0.0–0.1)
Basophil %: 0.2 %
Eosinophil #: 0 10*3/uL (ref 0.0–0.7)
Eosinophil %: 0 %
HCT: 35.8 % (ref 35.0–47.0)
HGB: 11.8 g/dL — ABNORMAL LOW (ref 12.0–16.0)
Lymphocyte #: 1.2 10*3/uL (ref 1.0–3.6)
Lymphocyte %: 5.2 %
MCH: 29 pg (ref 26.0–34.0)
MCHC: 33.1 g/dL (ref 32.0–36.0)
MCV: 88 fL (ref 80–100)
Monocyte #: 0.5 x10 3/mm (ref 0.2–0.9)
Monocyte %: 2.1 %
Neutrophil #: 21.2 10*3/uL — ABNORMAL HIGH (ref 1.4–6.5)
Neutrophil %: 92.5 %
Platelet: 196 10*3/uL (ref 150–440)
RBC: 4.08 10*6/uL (ref 3.80–5.20)
RDW: 14.9 % — ABNORMAL HIGH (ref 11.5–14.5)
WBC: 23 10*3/uL — ABNORMAL HIGH (ref 3.6–11.0)

## 2014-05-05 LAB — BASIC METABOLIC PANEL
Anion Gap: 6 — ABNORMAL LOW (ref 7–16)
BUN: 21 mg/dL — ABNORMAL HIGH (ref 7–18)
Calcium, Total: 8 mg/dL — ABNORMAL LOW (ref 8.5–10.1)
Chloride: 108 mmol/L — ABNORMAL HIGH (ref 98–107)
Co2: 26 mmol/L (ref 21–32)
Creatinine: 1.37 mg/dL — ABNORMAL HIGH (ref 0.60–1.30)
EGFR (African American): 43 — ABNORMAL LOW
EGFR (Non-African Amer.): 37 — ABNORMAL LOW
Glucose: 197 mg/dL — ABNORMAL HIGH (ref 65–99)
Osmolality: 288 (ref 275–301)
Potassium: 3.7 mmol/L (ref 3.5–5.1)
Sodium: 140 mmol/L (ref 136–145)

## 2014-05-05 LAB — CULTURE, BLOOD (SINGLE)

## 2014-05-06 LAB — CBC WITH DIFFERENTIAL/PLATELET
Basophil #: 0 10*3/uL (ref 0.0–0.1)
Basophil %: 0.1 %
Eosinophil #: 0 10*3/uL (ref 0.0–0.7)
Eosinophil %: 0 %
HCT: 37.9 % (ref 35.0–47.0)
HGB: 12.8 g/dL (ref 12.0–16.0)
Lymphocyte #: 1.5 10*3/uL (ref 1.0–3.6)
Lymphocyte %: 7.2 %
MCH: 29.5 pg (ref 26.0–34.0)
MCHC: 33.8 g/dL (ref 32.0–36.0)
MCV: 87 fL (ref 80–100)
Monocyte #: 0.5 x10 3/mm (ref 0.2–0.9)
Monocyte %: 2.2 %
Neutrophil #: 18.6 10*3/uL — ABNORMAL HIGH (ref 1.4–6.5)
Neutrophil %: 90.5 %
Platelet: 224 10*3/uL (ref 150–440)
RBC: 4.34 10*6/uL (ref 3.80–5.20)
RDW: 14.7 % — ABNORMAL HIGH (ref 11.5–14.5)
WBC: 20.5 10*3/uL — ABNORMAL HIGH (ref 3.6–11.0)

## 2014-05-06 LAB — BASIC METABOLIC PANEL
Anion Gap: 8 (ref 7–16)
BUN: 22 mg/dL — ABNORMAL HIGH (ref 7–18)
Calcium, Total: 7.9 mg/dL — ABNORMAL LOW (ref 8.5–10.1)
Chloride: 109 mmol/L — ABNORMAL HIGH (ref 98–107)
Co2: 24 mmol/L (ref 21–32)
Creatinine: 1.25 mg/dL (ref 0.60–1.30)
EGFR (African American): 48 — ABNORMAL LOW
EGFR (Non-African Amer.): 41 — ABNORMAL LOW
Glucose: 182 mg/dL — ABNORMAL HIGH (ref 65–99)
Osmolality: 289 (ref 275–301)
Potassium: 3.8 mmol/L (ref 3.5–5.1)
Sodium: 141 mmol/L (ref 136–145)

## 2014-05-07 LAB — CBC WITH DIFFERENTIAL/PLATELET
Basophil #: 0 10*3/uL (ref 0.0–0.1)
Basophil %: 0.2 %
Eosinophil #: 0 10*3/uL (ref 0.0–0.7)
Eosinophil %: 0.2 %
HCT: 37.2 % (ref 35.0–47.0)
HGB: 12.7 g/dL (ref 12.0–16.0)
Lymphocyte #: 2.3 10*3/uL (ref 1.0–3.6)
Lymphocyte %: 17.5 %
MCH: 29.4 pg (ref 26.0–34.0)
MCHC: 34.1 g/dL (ref 32.0–36.0)
MCV: 86 fL (ref 80–100)
Monocyte #: 1.1 x10 3/mm — ABNORMAL HIGH (ref 0.2–0.9)
Monocyte %: 8.4 %
Neutrophil #: 9.7 10*3/uL — ABNORMAL HIGH (ref 1.4–6.5)
Neutrophil %: 73.7 %
Platelet: 224 10*3/uL (ref 150–440)
RBC: 4.31 10*6/uL (ref 3.80–5.20)
RDW: 14.1 % (ref 11.5–14.5)
WBC: 13.1 10*3/uL — ABNORMAL HIGH (ref 3.6–11.0)

## 2014-05-07 LAB — BASIC METABOLIC PANEL
Anion Gap: 9 (ref 7–16)
BUN: 26 mg/dL — ABNORMAL HIGH (ref 7–18)
Calcium, Total: 7.9 mg/dL — ABNORMAL LOW (ref 8.5–10.1)
Chloride: 104 mmol/L (ref 98–107)
Co2: 29 mmol/L (ref 21–32)
Creatinine: 1.13 mg/dL (ref 0.60–1.30)
EGFR (African American): 54 — ABNORMAL LOW
EGFR (Non-African Amer.): 47 — ABNORMAL LOW
Glucose: 64 mg/dL — ABNORMAL LOW (ref 65–99)
Osmolality: 286 (ref 275–301)
Potassium: 3.2 mmol/L — ABNORMAL LOW (ref 3.5–5.1)
Sodium: 142 mmol/L (ref 136–145)

## 2014-05-10 LAB — CULTURE, BLOOD (SINGLE)

## 2014-06-02 DIAGNOSIS — E119 Type 2 diabetes mellitus without complications: Secondary | ICD-10-CM | POA: Insufficient documentation

## 2014-07-13 ENCOUNTER — Ambulatory Visit: Payer: Medicare Other | Admitting: Cardiovascular Disease

## 2014-07-21 ENCOUNTER — Ambulatory Visit: Payer: Self-pay | Admitting: Surgery

## 2014-10-17 ENCOUNTER — Other Ambulatory Visit: Payer: Self-pay | Admitting: Cardiovascular Disease

## 2014-11-14 ENCOUNTER — Other Ambulatory Visit: Payer: Self-pay | Admitting: Cardiovascular Disease

## 2015-01-23 NOTE — Consult Note (Signed)
PATIENT NAME:  [name], ATWAL MR#:  [id number] DATE OF BIRTH:  August 21, 1934  DATE OF CONSULTATION:  05/06/2014  REQUESTING PHYSICIAN:  Leonie Douglas. Doy Hutching, MD.    CONSULTING PHYSICIAN:  Cheral Marker. Ola Spurr, MD  REASON FOR CONSULTATION:  Group C strep bacteremia.   HISTORY OF PRESENT ILLNESS: This is a pleasant 81 year old female who was admitted August 2, with acute onset of fevers and shaking chills. She also had some nausea. The day prior, she had an episode of dizziness and had called EMS. They took her pressure which was slightly elevated. They told her it was likely vertigo. She took meclizine and was able to sleep, but the next day became ill. She is also having some increasing shortness or breath. Since admission, she has defervesced, but does have an elevated white count. She has been found to have 2/2 bottles growing group C strep.   Of note, the patient has been in her usual state of health without any issues with bronchitis, cough, urinary infections, nausea, vomiting, diarrhea, prior. She had no sick contacts. She has no pets or other animals. She does scratch at her back where she has some pruritus and thinks she may have had a small scab there.   PAST MEDICAL HISTORY:  1.  Stroke.  2.  Breast cancer.  3.  Vertigo.  4.  Chronic back pain.  5.  Hypertension.  6.  Hyperlipidemia.  7.  COPD.  8.  Hypothyroidism.   PAST SURGICAL HISTORY: Hysterectomy and lumbar laminectomy.   ALLERGIES: SHE IS ALLERGIC TO STATINS, ACIPHEX, NEXIUM, DURATUSS AND IV DYE.   SOCIAL HISTORY: Former smoker, denies alcohol use.   FAMILY HISTORY: Positive for diabetes, hypertension, coronary artery disease and stroke.   ANTIBIOTICS SINCE ADMISSION:   1.  Doxycycline given x2 days from August 2 to August 3.  2.  Zosyn August 2, 3 and 4.   3.  Vancomycin  August 2, 3, and 4. She also has been on methylprednisolone 60 mg IV q. 12.  REVIEW OF SYSTEMS:  Eleven systems reviewed and negative, except as  per HPI.   PHYSICAL EXAMINATION: VITAL SIGNS: Temperature 97.5, pulse 70, blood pressure 165/82, respirations 20, saturation 95% on room air.  GENERAL: She is obese, lying in bed in no acute distress.  HEENT: Pupils equal, round, and reactive to light and accommodation. Extraocular movements are intact. Sclerae anicteric. Oropharynx clear. No exudate noted. No thrush.  NECK: Supple.  HEART: Regular.  LUNGS: Clear.  ABDOMEN: Soft, obese, nontender, nondistended.  EXTREMITIES: No clubbing, cyanosis or edema.  SKIN: On her back, she has a few excoriations, but no active infection.   DIAGNOSTIC DATA: Chest x-ray, August 2 and 3 showed no acute cardiopulmonary disease. Blood cultures x2, group C strep. Repeat blood cultures from August 4 are no growth to date. Sensitivities showed sensitivity to ceftriaxone, vancomycin, levofloxacin and penicillin. Urine culture was negative. White blood count on admission was 14.2.  It peaked at 23 and is now 20.5. Hemoglobin is 12.8, platelets 224,000. Renal function shows a creatinine of 1.25 down from 1.44. LFTs done on admission were normal except an albumin of 2.9. Troponins were negative.   IMPRESSION: A 81 year old with group C strep bacteremia of unknown source. Likely source would be an upper respiratory infection, GI or skin. Regardless, she is clinically improving. I believe her elevated white count is likely due to methylprednisolone. She has not had any recurrent fevers. Echocardiogram is pending to evaluate for any valvular dysfunction or  endocarditis.   RECOMMENDATIONS: 1.  Discontinue vancomycin and Zosyn.  2.  I switched her to oral levofloxacin 500. I would recommend at least a 10-day course of followup antibiotics.  3.  If echo is normal, she can be discharged home in the next 1-2 days if she is medically stable.   Thank you for the consult. I will be glad to follow with you.    ____________________________ Cheral Marker. Ola Spurr,  MD dpf:ds D: 05/06/2014 14:29:18 ET T: 05/06/2014 15:17:46 ET JOB#: [id number]  cc: Cheral Marker. Ola Spurr, MD, <Dictator> DAVID Ola Spurr MD ELECTRONICALLY SIGNED 05/08/2014 22:06

## 2015-01-23 NOTE — H&P (Signed)
PATIENT NAME:  [name], [name] MR#:  [id number] DATE OF BIRTH:  [birth year]  DATE OF ADMISSION:  05/03/2014  REFERRING PHYSICIAN:  Gretchen Short. Beather Arbour, MD.  FAMILY PHYSICIAN: Leonie Douglas. Doy Hutching, M.D.   REASON FOR ADMISSION: Systemic inflammatory response syndrome with pneumonia.   HISTORY OF PRESENT ILLNESS: The patient is a 81 year old female with a history of COPD, benign hypertension, hyperlipidemia and previous stroke who presents to the Emergency Room with fevers, nausea, dizziness, and chills. In the Emergency Room the patient was noted to be tachycardic, febrile, with an elevated white count. She was in moderate respiratory distress with chest x-ray suggesting early pneumonia. She is now admitted for further evaluation.   PAST MEDICAL AND SURGICAL HISTORY:  1.  Previous stroke.  2.  Breast cancer. 3.  Vertigo.  4.  Chronic back pain.  5.  Benign hypertension.  6.  Hyperlipidemia.  7.  COPD.  8.  Hypothyroidism. 9.  Status post hysterectomy.  10. Status post lumbar laminectomy.   MEDICATIONS:  1.  Oxycodone 5 mg p.o. q.8 hours p.r.n.  2.  Nitrostat 0.4 mg p.r.n. chest pain.  3.  Lopressor 50 mg p.o. b.i.d.  4.  Synthroid 125 mcg p.o. daily.  5.  Amaryl 4 mg p.o. b.i.d.  6.  Gabapentin 100 mg p.o. b.i.d.  7.  Lasix 40 mg p.o. daily.  8.  Clonidine 0.2 mg p.o. b.i.d.  9.  Aspirin 325 mg p.o. daily.   ALLERGIES: STATINS, ACIPHEX, NEXIUM, DURATUSS AND IVP DYE.   SOCIAL HISTORY: The patient is a former smoker. Denies alcohol abuse.   FAMILY HISTORY: Positive for diabetes, hypertension, coronary artery disease, and stroke.   REVIEW OF SYSTEMS: CONSTITUTIONAL: No change in weight.  EYES: No blurred or double vision. No glaucoma.  ENT: No tinnitus or hearing loss. No nasal discharge or bleeding.  RESPIRATORY: No hemoptysis painful respiration.  CARDIOVASCULAR: No chest pain or orthopnea. No palpitations or syncope.  GASTROINTESTINAL: No vomiting or diarrhea. No abdominal pain.   GENITOURINARY: No dysuria or hematuria. No incontinence.  ENDOCRINE: No polyuria or polydipsia. No heat or cold intolerance.  HEMATOLOGIC: Denies anemia, easy bruising or bleeding.  LYMPHATIC: No swollen glands.  MUSCULOSKELETAL: The patient denies pain in her neck and shoulders, but has back pain. No gout.  NEUROLOGIC: No migraines. Denies seizures.  PSYCHIATRIC: The patient denies anxiety, insomnia or depression.   PHYSICAL EXAMINATION:  GENERAL: The patient is acutely ill-appearing, in moderate distress.  VITAL SIGNS: Currently remarkable for a blood pressure of 154/65, heart rate 102, respiratory rate 29, temperature 101.1, oxygen saturation 99% on oxygen.  HEENT: Normocephalic, atraumatic. Pupils equal, round, and reactive to light and accommodation. Extraocular movements are intact. Sclerae are not icteric. Conjunctivae are clear.  OROPHARYNX: Dry but clear.  NECK: Supple without JVD. No adenopathy or thyromegaly is noted.  LUNGS: Diffuse rhonchi, without wheezes or rales. No dullness. Respiratory effort is increased.  CARDIAC: Rapid rate with a regular rhythm. Normal S1, S2. No significant rubs or gallops. PMI is nondisplaced. Chest wall is nontender.  ABDOMEN: Soft, nontender, with normoactive bowel sounds. No organomegaly or masses were appreciated. No hernias or bruits were noted.  EXTREMITIES: Without clubbing, cyanosis or edema. Pulses were 2+ bilaterally.  SKIN: Warm and dry without rash or lesions.  NEUROLOGIC: Cranial nerves II-XII are grossly intact. Deep tendon reflexes were symmetric. Motor and sensory exam is nonfocal.  PSYCHIATRIC: The patient is alert and oriented to person, place, and time. She was cooperative and used good  judgment.   LABORATORY DATA:  Lactic acid elevated at 3.3. ABG on 2 liters revealed a pO2 of 70, white count 14.2, hemoglobin 13.1, glucose 131, BUN 17, creatinine 1.39, sodium 140, potassium 4.2.   EKG revealed sinus tachycardia with no acute  ischemic changes.   Chest x-ray revealed cardiomegaly with no focal consolidation or obvious pulmonary edema.   Urinalysis was unremarkable.   ASSESSMENT:  1.  Presumed pneumonia.  2.  Chronic obstructive pulmonary disease exacerbation.  3.  Systemic inflammatory response syndrome, manifested by leukocytosis, tachycardia and fever.  4.  Type 2 diabetes.  5.  Stage II chronic kidney disease.  6.  Hypothyroidism.   PLAN: The patient will be admitted to the floor with IV antibiotics and IV fluids. We will begin IV steroids and DuoNeb SVNs. We will supplement oxygen as needed. Cultures have been sent. Will follow her sugars with Accu-Cheks before meals and at bedtime and add sliding scale insulin as needed. We will wean oxygen as tolerated. Followup labs and chest x-ray in the morning. We will consult physical therapy. Further treatment and evaluation will depend upon the patient's progress.   Total time spent on this patient: 50 minutes.    ____________________________ Leonie Douglas Doy Hutching, MD jds:lt D: 05/03/2014 07:47:29 ET T: 05/03/2014 08:11:10 ET JOB#: [id number]  cc: Leonie Douglas. Doy Hutching, MD, <Dictator> Aanshi Batchelder Lennice Sites MD ELECTRONICALLY SIGNED 05/03/2014 15:20

## 2015-01-23 NOTE — Discharge Summary (Signed)
PATIENT NAME:  [name], [name] MR#:  [id number] DATE OF BIRTH:  23-Dec-1933  DATE OF ADMISSION:  05/03/2014 DATE OF DISCHARGE:  05/07/2014  PRIMARY CARE PHYSICIAN:  Idelle Crouch, MD    DISCHARGE DIAGNOSES: 1.  Group C strep bacteremia of unclear source.  2.  Hypertension.  3.  Diabetes.   HISTORY OF PRESENT ILLNESS: This is a pleasant 81 year old female who was admitted with acute onset of fevers, and shaking chills. She also had some shortness of breath. She was admitted, and found to have blood cultures of 2 out of 2 growing group C strep.   HOSPITAL COURSE BY ISSUE:  Bacteremia. Initially the patient was treated with vancomycin and Zosyn. There was no obvious source. The usual source for this would be an upper respiratory, GI, or skin infection. Clinically she improved, and defervesced. Echocardiogram was done, and was negative for any evidence of endocarditis. She will be discharged to complete a course of oral levofloxacin. If she has any recurrent fevers, chills, night sweats, she will represent.   DISCHARGE MEDICATIONS: Please see [facility] physician discharge instructions.   DISCHARGE FOLLOW-UP: The patient will follow up with Dr. Doy Hutching within 1 to 2 weeks of discharge.   DISCHARGE DIET: Carbohydrate, ADA -controlled, regular consistency.   DISCHARGE ACTIVITY: As tolerated.   TIME SPENT: This discharge took 35 minutes.    ____________________________ Cheral Marker. Ola Spurr, MD dpf:nt D: 05/21/2014 12:30:56 ET T: 05/21/2014 16:55:44 ET JOB#: [id number]  cc: Cheral Marker. Ola Spurr, MD, <Dictator> DAVID Ola Spurr MD ELECTRONICALLY SIGNED 05/22/2014 22:22

## 2015-01-24 NOTE — Consult Note (Signed)
Reason for Visit: This 82 year old Female patient presents to the clinic for initial evaluation of  Breast cancer .   Referred by Dr. Vladimir Creeks.  Diagnosis:   Chief Complaint/Diagnosis 81 year old female status post wide local excision and sentinel node biopsy for a pathologic stage I (T1 B. N0 M0 invasive mammary carcinoma ER/PR positive HER-2/neu negative with low recurrence risk on Oncotype diagnosis    Imaging Report Mammograms and ultrasound reviewed    Referral Report Clinical notes reviewed    Planned Treatment Regimen Adjuvant radiation therapy to right breast    HPI Patient is a 81 year old female who presents with an abnormal mammogram of the right breast. She was seen by Dr. Tamala Julian and underwent needle guided biopsy positive for ER/PR positive HER-2/neu negative invasive mammary carcinoma not otherwise specified. Tumor was at time of wide local excision 1.0 cm. Overall nuclear score of 2. One right axillary sentinel lymph node was benign. She underwent Oncotype diagnosis with recurrence risk score of 11. Not thought to be suitable candidate for chemotherapy based on all tumor parameters. She is now referred to radiation oncology for consideration of adjuvant treatment. She is doing well. She specifically denies breast tenderness cough or bone pain. Her margins were close at 1 mm.   Past Hx:    Breast Cancer:    HX: Vertigo:    Back Pain, Chronic:    Hypertension:    Hypothyroidism:    COPD:    Excision right breast mass: Oct 2012   Hysterectomy:    Lumbar laminectomy:    Nissan fundoplication:   Past, Family and Social History:   Past Medical History positive    Cardiovascular hypertension    Respiratory COPD    Endocrine hypothyroidism    Past Surgical History Hysterectomy, lumbar laminectomy    Past Medical History Comments Chronic back pain, vertigo    Family History positive    Family History Comments A maternal cousin with breast cancer    Social  History noncontributory    Additional Past Medical and Surgical History Accompanied by husband today   Allergies:   Other- Explain in Comments Line: Unknown  Aciphex: SOB  Nexium: Other  Duratuss: Other  Home Meds:  Home Medications:  Bystolic 10 mg oral tablet: 1 tab(s) orally once a day (in the morning), Active  aspirin 325 mg oral tablet: 1 tab(s) orally once a day, Active  L-Thyroxin 127mg: 1 tab(s) orally once a day, Active  Norco 5 mg-325 mg oral tablet: 1 to 2 tab(s) orally every4hr as needed, Active  Review of Systems:   General negative    Performance Status (ECOG) 0    Skin negative    Breast see HPI    Ophthalmologic negative    ENMT negative    Respiratory and Thorax negative    Cardiovascular negative    Gastrointestinal negative    Genitourinary negative    Musculoskeletal negative    Neurological negative    Psychiatric negative    Hematology/Lymphatics negative    Endocrine negative    Allergic/Immunologic negative   Nursing Notes:  Nursing Vital Signs and Chemo Nursing Nursing Notes: *CC Vital Signs Flowsheet:   03-Jan-13 08:36   Temp Temperature 97   Pulse Pulse 65   Respirations Respirations 20   SBP SBP 136   Pain Scale (0-10)  1   Current Weight (kg) (kg) 97.6   Height (cm) centimeters 164.2   BSA (m2) 2   Physical Exam:  General/Skin/HEENT:   General  normal    Skin normal    Eyes normal    ENMT normal    Head and Neck normal    Additional PE Well-developed female in NAD. She status post wide local excision of the right breast. Incision is well-healed. No dominant mass or nodularity is noted in either breast into position examined. No axillary or supraclavicular adenopathy is identified. Lungs are clear to A&P cardiac examination shows regular rate and rhythm.   Breasts/Resp/CV/GI/GU:   Respiratory and Thorax normal    Cardiovascular normal    Gastrointestinal normal    Genitourinary normal    MS/Neuro/Psych/Lymph:   Musculoskeletal normal    Neurological normal    Lymphatics normal   Other Results:  Radiology Results: [place]:    26-Sep-12 11:43, US Breast Right   US Breast Right    REASON FOR EXAM:    AV RT ASYMMETRY  COMMENTS:       PROCEDURE: [place]  - US BREAST RIGHT  - Jun 28 2011 11:43AM       RESULT:     COMPARISON:  06/26/2011, 04/05/2010 and 03/25/2009.     True lateral and spot compression magnification views were performed to  evaluate the findings seen on the screening mammograms in the right   breast. The spot compression magnification views demonstrate a small mass   in the medial right breast at approximately 2 o'clock. The borders are   ill-defined. There is the suggestion of some spiculation to the borders.   Real-time ultrasound was performed of the right breast from 2 o'clock to   4 o'clock. At 2 o'clock, there is a small, hypoechoic, ill-defined mass   with posterior acoustic shadowing. It measures 0.6 x 0.5x 0.5 cm.     IMPRESSION:     BI-RADS: Category 4 - Suspicious Abnormality     RECOMMENDATION:  Surgical consultation and tissue diagnosis are   recommended for the suspicious small mass in the medial right breast.     Thank you for this opportunity to contribute to the care of your patient.    IMPRESSION: \       E\Verified By: Gregor Hams, M.D., MD  [facility]:    24-Sep-12 07:39, Digital Screen Mammogram   Digital Screen Mammogram    REASON FOR EXAM:    SCR MAMMO  COMMENTS:       PROCEDURE: MAM - MAM DGTL SCREENING MAMMO W/CAD  - Jun 26 2011  7:39AM     RESULT:     COMPARISON:  04/05/2010, 03/25/2009, 03/24/2008 and 02/19/2007.     FINDINGS: The breast tissue is heterogeneously dense.There is a small,   round asymmetry in the medial posterior depth of the right CC view. This   may correlate with the posterior depth of the right MLO view, just below   the level of the nipple. No suspicious masses or calcifications are    identifiedin the left breast.     IMPRESSION:      BI-RADS:  Category 0 - Needs Additional Imaging Evaluation     RECOMMENDATION:  True lateral and spot compression magnification views of   the focal asymmetry in the medial right breast. Ultrasound will likelybe   necessary at that time.       Thank you for this opportunity to contribute to the care of your patient.    A NEGATIVE MAMMOGRAM REPORT DOES NOT PRECLUDE BIOPSY OR OTHER EVALUATION   OF A CLINICALLY PALPABLE OR OTHERWISE SUSPICIOUS MASS OR LESION. BREAST  CANCER MAY NOT BE DETECTED BY MAMMOGRAPHY IN UP TO 10% OF CASES.       Verified By: Gregor Hams, M.D., MD    26-Sep-12 10:35, Digital Additional Views Rt Breast (SCR)   Digital Additional Views Rt Breast (SCR)    REASON FOR EXAM:    AV RT ASYMMETRY  COMMENTS:       PROCEDURE: MAM - MAM DIG ADDVIEWS RT SCR  - Jun 28 2011 10:35AM       RESULT:     COMPARISON:  06/26/2011, 04/05/2010 and 03/25/2009.     True lateral and spot compression magnification views were performed to   evaluate the findings seen on the screening mammograms in the right   breast. The spot compression magnification views demonstrate a small mass   in the medial right breast at approximately 2 o'clock. The borders are   ill-defined. There isthe suggestion of some spiculation to the borders.   Real-time ultrasound was performed of the right breast from 2 o'clock to   4 o'clock. At 2 o'clock, there is a small, hypoechoic, ill-defined mass   with posterior acoustic shadowing. It measures 0.6 x 0.5 x 0.5 cm.     IMPRESSION:     BI-RADS: Category 4 - Suspicious Abnormality     RECOMMENDATION:  Surgical consultation and tissue diagnosis are   recommended for the suspicious small mass in the medial right breast.     Thank you for this opportunity to contribute to the care of your patient.    A NEGATIVE MAMMOGRAM REPORT DOES NOT PRECLUDE BIOPSY OR OTHER EVALUATION   OF A CLINICALLY PALPABLE OR  OTHERWISE SUSPICIOUS MASS OR LESION. BREAST     CANCER MAY NOT BE DETECTED BY MAMMOGRAPHY IN UP TO 10% OF CASES.           Verified By: Gregor Hams, M.D., MD   Assessment and Plan:   Impression Pathologic stage I invasive mammary carcinoma in 81 year old female status post wide local excision and sentinel node biopsy    Plan The stomach to go ahead with adjuvant radiation therapy to her right breast. As been since late October since the patient had her wide local excision so do not believe MammoSite catheter placement is possible at this time since for out over 2 months. I believe she would benefit from whole breast radiation therapy to 5000 cGy. I would also boost or scar another 2000 cGy based on the close margin 1 mm. We will use NCC N. guidelines for radiation therapy prescription. Risks and benefits of treatment were reviewed with the patient and her husband and both seem to [place] treatment plan well. I have set her up for CT simulation early next week. Patient will also benefit from aromatase inhibitor after completion of radiation and now will be discussed with Dr. Vladimir Creeks.  I would like to take this opportunity to thank you for allowing me to continue to participate in this patient's care.   CC Referral:   cc: Dr. Tamala Julian, Dr. Fulton Reek   Electronic Signatures: Baruch Gouty Roda Shutters (MD)  (Signed 03-Jan-13 11:44)  Authored: HPI, Diagnosis, Past Hx, PFSH, Allergies, Home Meds, ROS, Nursing Notes, Physical Exam, Other Results, Encounter Assessment and Plan, CC Referring Physician   Last Updated: 03-Jan-13 11:44 by Armstead Peaks (MD)

## 2015-01-27 ENCOUNTER — Ambulatory Visit (INDEPENDENT_AMBULATORY_CARE_PROVIDER_SITE_OTHER): Payer: Medicare Other | Admitting: Cardiovascular Disease

## 2015-01-27 ENCOUNTER — Encounter: Payer: Self-pay | Admitting: Cardiovascular Disease

## 2015-01-27 VITALS — BP 120/72 | HR 81 | Ht 64.0 in | Wt 225.5 lb

## 2015-01-27 DIAGNOSIS — Z951 Presence of aortocoronary bypass graft: Secondary | ICD-10-CM

## 2015-01-27 DIAGNOSIS — I10 Essential (primary) hypertension: Secondary | ICD-10-CM

## 2015-01-27 DIAGNOSIS — I25709 Atherosclerosis of coronary artery bypass graft(s), unspecified, with unspecified angina pectoris: Secondary | ICD-10-CM

## 2015-01-27 DIAGNOSIS — E785 Hyperlipidemia, unspecified: Secondary | ICD-10-CM

## 2015-01-27 DIAGNOSIS — E1159 Type 2 diabetes mellitus with other circulatory complications: Secondary | ICD-10-CM

## 2015-01-27 NOTE — Assessment & Plan Note (Signed)
Currently with no symptoms of angina. No further workup at this time. Continue current medication regimen. 

## 2015-01-27 NOTE — Progress Notes (Signed)
Patient ID: [name], female    DOB: [birth year], 81 y.o.   MRN: [id number]  HPI Comments: [name] is a very pleasant 81 yo woman with coronary artery disease, cardiac catheterization at [facility] 08/22/2012 showing severe distal left main, ostial LAD and circumflex disease also with RCA disease transferred to [facility] for a CABG x4, postsurgical stroke with right-sided deficits who presents for followup today of her coronary artery disease She had postsurgical stroke has recovered well. LIMA to LAD, SVG to Diag, SVG to OM1, SVG to PDA, EVH from bilateral thighs  Overall she is in good spirits. She does not do any regular exercise. Legs are getting weaker, no recent falls She does minimal walking around the house, reports that her knees are sore  She had pneumonia 05/06/2014 with bacteremia requiring hospitalization and IV antibiotics. Otherwise has been doing well with no complaints.  Labs reviewed with her today : Hemoglobin A1c 6.9, total cholesterol 248. She does not want a statin   EKG today shows normal sinus rhythm with rate 81 beats per minute with left axis deviation, T-wave abnormality anterolateral leads, poor R-wave progression through the anterior precordial leads, consider old anterior MI  Other past medical history She reports that she has tried many medications and they have side effects She denies any significant lower extremity edema, no shortness of breath with exertion. She does report having some nasal stuffiness. Previously seen by ear nose throat, Dr. Tami Ribas She reports having some personality change since the surgery, more irritable Previous cholesterol was 210, LDL 111, hemoglobin A1c 6.6  Current cardiac catheterization report 08/22/2012 showed distal left main 70% disease, ostial LAD of 80%, proximal LAD 70%, distal LAD diffuse 70%, diagonal #150% followed by discrete 95% lesion, ostial circumflex 90% disease, proximal circumflex 70% disease, proximal RCA  50%, distal RCA 60% PDA branch 60%   Allergies  Allergen Reactions  . Ciprocinonide [Fluocinolone]     Feels crazy  . Darvocet [Propoxyphene N-Acetaminophen]     Difficulty breathing and swallowing  . Ivp Dye [Iodinated Diagnostic Agents]     Nausea & vomiting  . Other     Reports all cholesterol medication  . Statins     Muscle aches    Outpatient Encounter Prescriptions as of 01/27/2015  Medication Sig  . aspirin 325 MG EC tablet Take 325 mg by mouth daily.  . B Complex Vitamins (VITAMIN B COMPLEX IJ) Inject as directed every 30 (thirty) days.  . calcium carbonate (TUMS - DOSED IN MG ELEMENTAL CALCIUM) 500 MG chewable tablet Chew 1 tablet by mouth daily.  . cloNIDine (CATAPRES) 0.2 MG tablet Take 1 tablet (0.2 mg total) by mouth 2 (two) times daily.  . fluticasone (FLONASE) 50 MCG/ACT nasal spray Place 1 spray into the nose daily.   . furosemide (LASIX) 40 MG tablet Take 1 tablet (40 mg total) by mouth daily.  Marland Kitchen gabapentin (NEURONTIN) 100 MG capsule Take 100 mg by mouth 2 (two) times daily.  Marland Kitchen glimepiride (AMARYL) 4 MG tablet Take 4 mg by mouth 2 (two) times daily.  Marland Kitchen HYDROcodone-acetaminophen (NORCO/VICODIN) 5-325 MG per tablet Take 1 tablet by mouth every 6 (six) hours as needed for moderate pain.  Marland Kitchen levothyroxine (SYNTHROID, LEVOTHROID) 125 MCG tablet Take 125 mcg by mouth daily.  . metoprolol (LOPRESSOR) 50 MG tablet Take 1 tablet (50 mg total) by mouth 2 (two) times daily. Note change in dosage of medication  . Omega-3 Fatty Acids (FISH OIL) 1000 MG CAPS Take by  mouth.  . potassium chloride SA (K-DUR,KLOR-CON) 20 MEQ tablet Take 20 mEq by mouth daily.    Past Medical History  Diagnosis Date  . Breast mass, right   . Vertigo   . Thyroid disease   . History of breast cancer     39 treatments of radiation. Negative chemo.  . Diabetes   . GERD (gastroesophageal reflux disease)   . Hyperlipemia   . History of seasonal allergies   . Arthritis   . Angina pectoris  08/26/2012  . Complication of anesthesia   . PONV (postoperative nausea and vomiting)   . Hypertension     sees Dr. Fulton Reek  . Coronary artery disease 08/22/2012    sees Dr Rockey Situ  . Pneumonia     hx of  . Headache(784.0)     migraines  . Cancer     breast cancer, right side  . Gastritis     hx of  . 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 Stroke     Past Surgical History  Procedure Laterality Date  . Abdominal hysterectomy    . Ovarian cyst removal    . Back surgery    . Appendectomy    . Breast surgery    . Laparoscopic nissen fundoplication    . Intraoperative transesophageal echocardiogram  09/05/2012    Procedure: INTRAOPERATIVE TRANSESOPHAGEAL ECHOCARDIOGRAM;  Surgeon: Rexene Alberts, MD;  Location: [place];  Service: Open Heart Surgery;  Laterality: N/A;  . Coronary artery bypass graft  09/05/2012    Procedure: CORONARY ARTERY BYPASS GRAFTING (CABG);  Surgeon: Rexene Alberts, MD;  Location: [place];  Service: Open Heart Surgery;  Laterality: N/A;  CABG x four, using left internal mammary artery and bilateral greater saphenous vein harvested endoscopically  . Cardiac catheterization  2014    Social History  reports that she quit smoking about 26 years ago. Her smoking use included Cigarettes. She does not have any smokeless tobacco history on file. She reports that she does not drink alcohol or use illicit drugs.  Family History family history includes Cancer in her father; Heart disease in her brother and mother; Hyperlipidemia in her brother; Hypertension in her brother.       Review of Systems  Constitutional: Negative.   Respiratory: Negative.   Cardiovascular: Negative.   Gastrointestinal: Negative.   Musculoskeletal: Positive for gait problem.  Skin: Negative.   Neurological: Negative.   Hematological: Negative.   Psychiatric/Behavioral: Negative.   All other systems reviewed and are  negative.   BP 120/72 mmHg  Pulse 81  Ht 5\' 4"  (1.626 m)  Wt 225 lb 8 oz (102.286 kg)  BMI 38.69 kg/m2  Physical Exam  Constitutional: She is oriented to person, place, and time. She appears well-developed and well-nourished.  HENT:  Head: Normocephalic.  Nose: Nose normal.  Mouth/Throat: Oropharynx is clear and moist.  Eyes: Conjunctivae are normal. Pupils are equal, round, and reactive to light.  Neck: Normal range of motion. Neck supple. No JVD present.  Cardiovascular: Normal rate, regular rhythm, S1 normal, S2 normal, normal heart sounds and intact distal pulses.  Exam reveals no gallop and no friction rub.   No murmur heard. Well healed mediastinal incision  Pulmonary/Chest: Effort normal and breath sounds normal. No respiratory distress. She has no wheezes. She has no rales. She exhibits no tenderness.  Abdominal: Soft. Bowel sounds are normal.  She exhibits no distension. There is no tenderness.  Musculoskeletal: Normal range of motion. She exhibits no edema or tenderness.  Lymphadenopathy:    She has no cervical adenopathy.  Neurological: She is alert and oriented to person, place, and time. Coordination normal.  Skin: Skin is warm and dry. No rash noted. No erythema.  Psychiatric: She has a normal mood and affect. Her behavior is normal. Judgment and thought content normal.    Assessment and Plan  Nursing note and vitals reviewed.

## 2015-01-27 NOTE — Patient Instructions (Signed)
You are doing well. No medication changes were made.  Please call us if you have new issues that need to be addressed before your next appt.  Your physician wants you to follow-up in: 6 months.  You will receive a reminder letter in the mail two months in advance. If you don't receive a letter, please call our office to schedule the follow-up appointment.   

## 2015-01-27 NOTE — Assessment & Plan Note (Signed)
She has indicated in the past that she does not want a statin

## 2015-01-27 NOTE — Assessment & Plan Note (Signed)
Blood pressure is well controlled on today's visit. No changes made to the medications. 

## 2015-01-27 NOTE — Assessment & Plan Note (Signed)
We have encouraged continued exercise, careful diet management in an effort to lose weight. 

## 2015-06-24 ENCOUNTER — Other Ambulatory Visit: Payer: Self-pay | Admitting: Surgery

## 2015-06-24 DIAGNOSIS — Z853 Personal history of malignant neoplasm of breast: Secondary | ICD-10-CM

## 2015-07-15 ENCOUNTER — Emergency Department: Payer: Medicare Other

## 2015-07-15 ENCOUNTER — Encounter: Payer: Self-pay | Admitting: Emergency Medicine

## 2015-07-15 ENCOUNTER — Emergency Department
Admission: EM | Admit: 2015-07-15 | Discharge: 2015-07-15 | Disposition: A | Discharge status: Home / Self Care | Payer: Medicare Other | Source: Home / Self Care | Attending: Emergency Medicine | Admitting: Emergency Medicine

## 2015-07-15 DIAGNOSIS — Z87891 Personal history of nicotine dependence: Secondary | ICD-10-CM | POA: Insufficient documentation

## 2015-07-15 DIAGNOSIS — I10 Essential (primary) hypertension: Secondary | ICD-10-CM | POA: Insufficient documentation

## 2015-07-15 DIAGNOSIS — E860 Dehydration: Secondary | ICD-10-CM | POA: Insufficient documentation

## 2015-07-15 DIAGNOSIS — R0600 Dyspnea, unspecified: Secondary | ICD-10-CM

## 2015-07-15 DIAGNOSIS — I509 Heart failure, unspecified: Secondary | ICD-10-CM | POA: Insufficient documentation

## 2015-07-15 DIAGNOSIS — I5031 Acute diastolic (congestive) heart failure: Secondary | ICD-10-CM | POA: Diagnosis not present

## 2015-07-15 DIAGNOSIS — Z7982 Long term (current) use of aspirin: Secondary | ICD-10-CM | POA: Insufficient documentation

## 2015-07-15 DIAGNOSIS — E119 Type 2 diabetes mellitus without complications: Secondary | ICD-10-CM | POA: Insufficient documentation

## 2015-07-15 DIAGNOSIS — Z79899 Other long term (current) drug therapy: Secondary | ICD-10-CM | POA: Insufficient documentation

## 2015-07-15 DIAGNOSIS — Z7951 Long term (current) use of inhaled steroids: Secondary | ICD-10-CM | POA: Insufficient documentation

## 2015-07-15 DIAGNOSIS — J189 Pneumonia, unspecified organism: Secondary | ICD-10-CM | POA: Diagnosis not present

## 2015-07-15 LAB — COMPREHENSIVE METABOLIC PANEL
ALT: 19 U/L (ref 14–54)
AST: 21 U/L (ref 15–41)
Albumin: 3.2 g/dL — ABNORMAL LOW (ref 3.5–5.0)
Alkaline Phosphatase: 85 U/L (ref 38–126)
Anion gap: 8 (ref 5–15)
BUN: 31 mg/dL — ABNORMAL HIGH (ref 6–20)
CO2: 24 mmol/L (ref 22–32)
Calcium: 8.4 mg/dL — ABNORMAL LOW (ref 8.9–10.3)
Chloride: 105 mmol/L (ref 101–111)
Creatinine, Ser: 1.54 mg/dL — ABNORMAL HIGH (ref 0.44–1.00)
GFR calc Af Amer: 36 mL/min — ABNORMAL LOW (ref >60)
GFR calc non Af Amer: 31 mL/min — ABNORMAL LOW (ref >60)
Glucose, Bld: 173 mg/dL — ABNORMAL HIGH (ref 65–99)
Potassium: 4.8 mmol/L (ref 3.5–5.1)
Sodium: 137 mmol/L (ref 135–145)
Total Bilirubin: 1 mg/dL (ref 0.3–1.2)
Total Protein: 7.4 g/dL (ref 6.5–8.1)

## 2015-07-15 LAB — CBC
HCT: 36.1 % (ref 35.0–47.0)
Hemoglobin: 12.1 g/dL (ref 12.0–16.0)
MCH: 28.3 pg (ref 26.0–34.0)
MCHC: 33.5 g/dL (ref 32.0–36.0)
MCV: 84.7 fL (ref 80.0–100.0)
Platelets: 151 10*3/uL (ref 150–440)
RBC: 4.26 MIL/uL (ref 3.80–5.20)
RDW: 14.9 % — ABNORMAL HIGH (ref 11.5–14.5)
WBC: 11.2 10*3/uL — ABNORMAL HIGH (ref 3.6–11.0)

## 2015-07-15 LAB — TROPONIN I: Troponin I: <0.03 ng/mL (ref <0.031)

## 2015-07-15 MED ORDER — ALBUTEROL SULFATE (2.5 MG/3ML) 0.083% IN NEBU
5.0000 mg | INHALATION_SOLUTION | Freq: Once | RESPIRATORY_TRACT | Status: AC
  Administered 2015-07-15: 5 mg via RESPIRATORY_TRACT
  Filled 2015-07-15: qty 6

## 2015-07-15 MED ORDER — FUROSEMIDE 10 MG/ML IJ SOLN
60.0000 mg | Freq: Once | INTRAMUSCULAR | Status: AC
  Administered 2015-07-15: 60 mg via INTRAVENOUS
  Filled 2015-07-15: qty 8

## 2015-07-15 NOTE — Discharge Instructions (Signed)
As we have discussed please take 40 mg of Lasix every morning until you have been seen by her primary care physician on Monday. Return to the emergency department for any worsening trouble breathing, any chest pain, or any other symptom personally concerning to yourself.   Heart Failure Heart failure is a condition in which the heart has trouble pumping blood. This means your heart does not pump blood efficiently for your body to work well. In some cases of heart failure, fluid may back up into your lungs or you may have swelling (edema) in your lower legs. Heart failure is usually a long-term (chronic) condition. It is important for you to take good care of yourself and follow your health care provider's treatment plan. CAUSES  Some health conditions can cause heart failure. Those health conditions include:  High blood pressure (hypertension). Hypertension causes the heart muscle to work harder than normal. When pressure in the blood vessels is high, the heart needs to pump (contract) with more force in order to circulate blood throughout the body. High blood pressure eventually causes the heart to become stiff and weak.  Coronary artery disease (CAD). CAD is the buildup of cholesterol and fat (plaque) in the arteries of the heart. The blockage in the arteries deprives the heart muscle of oxygen and blood. This can cause chest pain and may lead to a heart attack. High blood pressure can also contribute to CAD.  Heart attack (myocardial infarction). A heart attack occurs when one or more arteries in the heart become blocked. The loss of oxygen damages the muscle tissue of the heart. When this happens, part of the heart muscle dies. The injured tissue does not contract as well and weakens the heart's ability to pump blood.  Abnormal heart valves. When the heart valves do not open and close properly, it can cause heart failure. This makes the heart muscle pump harder to keep the blood flowing.  Heart  muscle disease (cardiomyopathy or myocarditis). Heart muscle disease is damage to the heart muscle from a variety of causes. These can include drug or alcohol abuse, infections, or unknown reasons. These can increase the risk of heart failure.  Lung disease. Lung disease makes the heart work harder because the lungs do not work properly. This can cause a strain on the heart, leading it to fail.  Diabetes. Diabetes increases the risk of heart failure. High blood sugar contributes to high fat (lipid) levels in the blood. Diabetes can also cause slow damage to tiny blood vessels that carry important nutrients to the heart muscle. When the heart does not get enough oxygen and food, it can cause the heart to become weak and stiff. This leads to a heart that does not contract efficiently.  Other conditions can contribute to heart failure. These include abnormal heart rhythms, thyroid problems, and low blood counts (anemia). Certain unhealthy behaviors can increase the risk of heart failure, including:  Being overweight.  Smoking or chewing tobacco.  Eating foods high in fat and cholesterol.  Abusing illicit drugs or alcohol.  Lacking physical activity. SYMPTOMS  Heart failure symptoms may vary and can be hard to detect. Symptoms may include:  Shortness of breath with activity, such as climbing stairs.  Persistent cough.  Swelling of the feet, ankles, legs, or abdomen.  Unexplained weight gain.  Difficulty breathing when lying flat (orthopnea).  Waking from sleep because of the need to sit up and get more air.  Rapid heartbeat.  Fatigue and loss of energy.  Feeling light-headed, dizzy, or close to fainting.  Loss of appetite.  Nausea.  Increased urination during the night (nocturia). DIAGNOSIS  A diagnosis of heart failure is based on your history, symptoms, physical examination, and diagnostic tests. Diagnostic tests for heart failure may  include:  Echocardiography.  Electrocardiography.  Chest X-ray.  Blood tests.  Exercise stress test.  Cardiac angiography.  Radionuclide scans. TREATMENT  Treatment is aimed at managing the symptoms of heart failure. Medicines, behavioral changes, or surgical intervention may be necessary to treat heart failure.  Medicines to help treat heart failure may include:  Angiotensin-converting enzyme (ACE) inhibitors. This type of medicine blocks the effects of a blood protein called angiotensin-converting enzyme. ACE inhibitors relax (dilate) the blood vessels and help lower blood pressure.  Angiotensin receptor blockers (ARBs). This type of medicine blocks the actions of a blood protein called angiotensin. Angiotensin receptor blockers dilate the blood vessels and help lower blood pressure.  Water pills (diuretics). Diuretics cause the kidneys to remove salt and water from the blood. The extra fluid is removed through urination. This loss of extra fluid lowers the volume of blood the heart pumps.  Beta blockers. These prevent the heart from beating too fast and improve heart muscle strength.  Digitalis. This increases the force of the heartbeat.  Healthy behavior changes include:  Obtaining and maintaining a healthy weight.  Stopping smoking or chewing tobacco.  Eating heart-healthy foods.  Limiting or avoiding alcohol.  Stopping illicit drug use.  Physical activity as directed by your health care provider.  Surgical treatment for heart failure may include:  A procedure to open blocked arteries, repair damaged heart valves, or remove damaged heart muscle tissue.  A pacemaker to improve heart muscle function and control certain abnormal heart rhythms.  An internal cardioverter defibrillator to treat certain serious abnormal heart rhythms.  A left ventricular assist device (LVAD) to assist the pumping ability of the heart. HOME CARE INSTRUCTIONS   Take medicines only  as directed by your health care provider. Medicines are important in reducing the workload of your heart, slowing the progression of heart failure, and improving your symptoms.  Do not stop taking your medicine unless directed by your health care provider.  Do not skip any dose of medicine.  Refill your prescriptions before you run out of medicine. Your medicines are needed every day.  Engage in moderate physical activity if directed by your health care provider. Moderate physical activity can benefit some people. The elderly and people with severe heart failure should consult with a health care provider for physical activity recommendations.  Eat heart-healthy foods. Food choices should be free of trans fat and low in saturated fat, cholesterol, and salt (sodium). Healthy choices include fresh or frozen fruits and vegetables, fish, lean meats, legumes, fat-free or low-fat dairy products, and whole grain or high fiber foods. Talk to a dietitian to learn more about heart-healthy foods.  Limit sodium if directed by your health care provider. Sodium restriction may reduce symptoms of heart failure in some people. Talk to a dietitian to learn more about heart-healthy seasonings.  Use healthy cooking methods. Healthy cooking methods include roasting, grilling, broiling, baking, poaching, steaming, or stir-frying. Talk to a dietitian to learn more about healthy cooking methods.  Limit fluids if directed by your health care provider. Fluid restriction may reduce symptoms of heart failure in some people.  Weigh yourself every day. Daily weights are important in the early recognition of excess fluid. You should weigh yourself every  morning after you urinate and before you eat breakfast. Wear the same amount of clothing each time you weigh yourself. Record your daily weight. Provide your health care provider with your weight record.  Monitor and record your blood pressure if directed by your health care  provider.  Check your pulse if directed by your health care provider.  Lose weight if directed by your health care provider. Weight loss may reduce symptoms of heart failure in some people.  Stop smoking or chewing tobacco. Nicotine makes your heart work harder by causing your blood vessels to constrict. Do not use nicotine gum or patches before talking to your health care provider.  Keep all follow-up visits as directed by your health care provider. This is important.  Limit alcohol intake to no more than 1 drink per day for nonpregnant women and 2 drinks per day for men. One drink equals 12 ounces of beer, 5 ounces of wine, or 1 ounces of hard liquor. Drinking more than that is harmful to your heart. Tell your health care provider if you drink alcohol several times a week. Talk with your health care provider about whether alcohol is safe for you. If your heart has already been damaged by alcohol or you have severe heart failure, drinking alcohol should be stopped completely.  Stop illicit drug use.  Stay up-to-date with immunizations. It is especially important to prevent respiratory infections through current pneumococcal and influenza immunizations.  Manage other health conditions such as hypertension, diabetes, thyroid disease, or abnormal heart rhythms as directed by your health care provider.  Learn to manage stress.  Plan rest periods when fatigued.  Learn strategies to manage high temperatures. If the weather is extremely hot:  Avoid vigorous physical activity.  Use air conditioning or fans or seek a cooler location.  Avoid caffeine and alcohol.  Wear loose-fitting, lightweight, and light-colored clothing.  Learn strategies to manage cold temperatures. If the weather is extremely cold:  Avoid vigorous physical activity.  Layer clothes.  Wear mittens or gloves, a hat, and a scarf when going outside.  Avoid alcohol.  Obtain ongoing education and support as  needed.  Participate in or seek rehabilitation as needed to maintain or improve independence and quality of life. SEEK MEDICAL CARE IF:   You have a rapid weight gain.  You have increasing shortness of breath that is unusual for you.  You are unable to participate in your usual physical activities.  You tire easily.  You cough more than normal, especially with physical activity.  You have any or more swelling in areas such as your hands, feet, ankles, or abdomen.  You are unable to sleep because it is hard to breathe.  You feel like your heart is beating fast (palpitations).  You become dizzy or light-headed upon standing up. SEEK IMMEDIATE MEDICAL CARE IF:   You have difficulty breathing.  There is a change in mental status such as decreased alertness or difficulty with concentration.  You have a pain or discomfort in your chest.  You have an episode of fainting (syncope). MAKE SURE YOU:   Understand these instructions.  Will watch your condition.  Will get help right away if you are not doing well or get worse.   This information is not intended to replace advice given to you by your health care provider. Make sure you discuss any questions you have with your health care provider.   Document Released: 09/18/2005 Document Revised: 02/02/2015 Document Reviewed: 10/18/2012 Elsevier Interactive Patient Education  Education ©2016 [employer] ° °

## 2015-07-15 NOTE — ED Provider Notes (Signed)
[facility] Emergency Department Provider Note  Time seen: 9:21 AM  I have reviewed the triage vital signs and the nursing notes.   HISTORY  Chief Complaint Shortness of Breath    HPI [name] is a 81 y.o. 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 According to the patient for the past 2-3 days she has felt increasingly short of breath, worse when she lies flat. Face she has a sensation that she just can't get a full breath. She has also noticed increased lower extremity swelling. She states her primary care physician took her off of Lasix 1.5 weeks ago, as he believed the patient was becoming dehydrated. Patient states she took a half a tablet yesterday to see if it would help, but it did not so the patient came to the emergency department today for evaluation. Denies any chest pain. States her shortness of breath is mild if she is sitting upright, moderate if she tries to lie flat.     Past Medical History  Diagnosis Date  . Breast mass, right   . Vertigo   . Thyroid disease   . History of breast cancer     39 treatments of radiation. Negative chemo.  . Diabetes ([place])   . GERD (gastroesophageal reflux disease)   . Hyperlipemia   . History of seasonal allergies   . Arthritis   . Angina pectoris ([place]) 08/26/2012  . Complication of anesthesia   . PONV (postoperative nausea and vomiting)   . Hypertension     sees Dr. Fulton Reek  . Coronary artery disease 08/22/2012    sees Dr Rockey Situ  . Pneumonia     hx of  . Headache(784.0)     migraines  . Cancer [facility])     breast cancer, right side  . Gastritis     hx of  . 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 Stroke [facility])     Patient Active Problem List   Diagnosis Date Noted  .  Cerebral embolism with cerebral infarction ([place]) 09/07/2012  . Hemiplegia, unspecified, affecting dominant side 09/07/2012  . 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 Angina pectoris ([place]) 08/26/2012  . Coronary artery disease 08/22/2012  . Throat pain 08/19/2012  . Shortness of breath 08/19/2012  . Hyperlipidemia 08/19/2012  . Diabetes ([place]) 08/19/2012  . Hypertension 08/19/2012  . Breast cancer ([place]) 08/19/2012  . GERD (gastroesophageal reflux disease) 08/19/2012    Past Surgical History  Procedure Laterality Date  . Abdominal hysterectomy    . Ovarian cyst removal    . Back surgery    . Appendectomy    . Breast surgery    . Laparoscopic nissen fundoplication    . Intraoperative transesophageal echocardiogram  09/05/2012    Procedure: INTRAOPERATIVE TRANSESOPHAGEAL ECHOCARDIOGRAM;  Surgeon: Rexene Alberts, MD;  Location: [place];  Service: Open Heart Surgery;  Laterality: N/A;  . Coronary artery bypass graft  09/05/2012    Procedure: CORONARY ARTERY BYPASS GRAFTING (CABG);  Surgeon: Rexene Alberts, MD;  Location: [place];  Service: Open Heart Surgery;  Laterality: N/A;  CABG x four, using left internal mammary artery and bilateral greater saphenous vein harvested endoscopically  . Cardiac catheterization  2014    Current Outpatient Rx  Name  Route  Sig  Dispense  Refill  . aspirin 325 MG EC tablet   Oral   Take 325 mg by mouth daily.         . B Complex Vitamins (VITAMIN B COMPLEX IJ)   Injection   Inject as directed every 30 (thirty) days.         . calcium carbonate (TUMS - DOSED IN MG ELEMENTAL CALCIUM) 500 MG chewable tablet   Oral   Chew 1 tablet by mouth daily.         . cloNIDine (CATAPRES) 0.2 MG tablet   Oral   Take 1 tablet (0.2 mg total) by mouth 2 (two) times daily.   180 tablet   3   . fluticasone (FLONASE) 50 MCG/ACT nasal spray   Nasal   Place 1 spray into the nose daily.          . furosemide (LASIX) 40 MG tablet   Oral   Take 1 tablet (40 mg total) by  mouth daily.   90 tablet   3   . gabapentin (NEURONTIN) 100 MG capsule   Oral   Take 100 mg by mouth 2 (two) times daily.         Marland Kitchen glimepiride (AMARYL) 4 MG tablet   Oral   Take 4 mg by mouth 2 (two) times daily.         Marland Kitchen HYDROcodone-acetaminophen (NORCO/VICODIN) 5-325 MG per tablet   Oral   Take 1 tablet by mouth every 6 (six) hours as needed for moderate pain.         Marland Kitchen levothyroxine (SYNTHROID, LEVOTHROID) 125 MCG tablet   Oral   Take 125 mcg by mouth daily.         . metoprolol (LOPRESSOR) 50 MG tablet   Oral   Take 1 tablet (50 mg total) by mouth 2 (two) times daily. Note change in dosage of medication   180 tablet   3   . Omega-3 Fatty Acids (FISH OIL) 1000 MG CAPS   Oral   Take by mouth.         . potassium chloride SA (K-DUR,KLOR-CON) 20 MEQ tablet   Oral   Take 20 mEq by mouth daily.           Allergies Ciprocinonide; Darvocet; Ivp dye; Other; and Statins  Family History  Problem Relation Age of Onset  . Heart disease Brother     CABG & stents  . Hyperlipidemia Brother   . Hypertension Brother   . Cancer Father   . Heart disease Mother     Social History Social History  Substance Use Topics  . Smoking status: Former Smoker    Types: Cigarettes    Quit date: 11/13/1988  . Smokeless tobacco: None  . Alcohol Use: No    Review of Systems Constitutional: Negative for fever. Cardiovascular: Negative for chest pain. Respiratory: Positive for shortness breath, worse when lying flat. Gastrointestinal: Negative for abdominal pain Genitourinary: Negative for dysuria. Musculoskeletal: Negative for back pain. Neurological: Negative for headache 10-point ROS otherwise negative.  ____________________________________________   PHYSICAL EXAM:  VITAL SIGNS: ED Triage Vitals  Enc Vitals Group     BP 07/15/15 0904 163/96 mmHg     Pulse Rate 07/15/15 0904 93     Resp 07/15/15 0904 20     Temp 07/15/15 0904 97.6 F (36.4 C)     Temp  Source 07/15/15 0904 Oral     SpO2 07/15/15 0904 95 %  Weight 07/15/15 0904 223 lb (101.152 kg)     Height 07/15/15 0904 5\' 4"  (1.626 m)     Head Cir --      Peak Flow --      Pain Score --      Pain Loc --      Pain Edu? --      Excl. in [place]? --    Constitutional: Alert and oriented. Well appearing and in no distress. Eyes: Normal exam ENT   Head: Normocephalic and atraumatic.   Mouth/Throat: Mucous membranes are moist. Cardiovascular: Normal rate, regular rhythm.  Respiratory: Normal respiratory effort without tachypnea nor retractions. Breath sounds are clear and equal bilaterally. No wheezes/rales/rhonchi. Gastrointestinal: Soft and nontender. No distention.  Musculoskeletal: Nontender with normal range of motion in all extremities. 1+ lower extremity edema bilaterally. No calf tenderness. Neurologic:  Normal speech and language. No gross focal neurologic deficits  Psychiatric: Mood and affect are normal. Speech and behavior are normal.  ____________________________________________    EKG  EKG reviewed and interpreted by myself shows normal sinus rhythm at 86 bpm, narrow QRS, left axis deviation, normal intervals, nonspecific ST changes present. No ST elevations.  ____________________________________________    RADIOLOGY   Small left pleural effusion. Some interstitial fullness possibly representing edema.   INITIAL IMPRESSION / ASSESSMENT AND PLAN / ED COURSE  Pertinent labs & imaging results that were available during my care of the patient were reviewed by me and considered in my medical decision making (see chart for details).  Patient presents with shortness of breath, worse when lying flat. She was taken off of her Lasix 40 mg daily, 1.5 weeks ago. Patient likely with fluid overload given her lower extremity swelling. Patient has a good oxygen saturation on room air, we will check labs, chest x-ray, and closely monitor in the department.   Patient's labs  are largely within normal limits. Her chest x-ray does not show any overwhelming edema, there is some interstitial fullness, which could be representing mild edema. Patient is currently satting 96% on room air, on ambulation the patient did desat to 90-91 percent. I offered the patient admission to the hospital for IV diuresis. The patient strongly prefers to go home. I discussed with the patient Lasix 40 mg every day until she sees her primary care doctor (Dr. Doy Hutching) on Monday, which she already has an appointment. I discussed very strict return precautions for any worsening shortness breath, chest pain, patient is to return to the hospital for reevaluation. Patient agreeable. ____________________________________________   FINAL CLINICAL IMPRESSION(S) / ED DIAGNOSES  Dyspnea  congestive heart failure exacerbation  Harvest Dark, MD 07/15/15 1057

## 2015-07-15 NOTE — ED Notes (Signed)
Pt alert x4 respirations easy non labored.  

## 2015-07-15 NOTE — ED Notes (Signed)
C/o sob and nonproductive cough x 2 days, states sob is worse when she lies down, denies any cp

## 2015-07-17 ENCOUNTER — Emergency Department: Payer: Medicare Other

## 2015-07-17 ENCOUNTER — Inpatient Hospital Stay (HOSPITAL_COMMUNITY)
Admit: 2015-07-17 | Discharge: 2015-07-17 | Disposition: A | Discharge status: Home / Self Care | Payer: Medicare Other | Attending: Internal Medicine | Admitting: Internal Medicine

## 2015-07-17 ENCOUNTER — Inpatient Hospital Stay
Admission: EM | Admit: 2015-07-17 | Discharge: 2015-07-20 | DRG: 291 | Disposition: A | Discharge status: Home / Self Care | Payer: Medicare Other | Attending: Internal Medicine | Admitting: Internal Medicine

## 2015-07-17 ENCOUNTER — Encounter: Payer: Self-pay | Admitting: Emergency Medicine

## 2015-07-17 DIAGNOSIS — Z7982 Long term (current) use of aspirin: Secondary | ICD-10-CM

## 2015-07-17 DIAGNOSIS — Z9049 Acquired absence of other specified parts of digestive tract: Secondary | ICD-10-CM

## 2015-07-17 DIAGNOSIS — N179 Acute kidney failure, unspecified: Secondary | ICD-10-CM | POA: Diagnosis present

## 2015-07-17 DIAGNOSIS — Z853 Personal history of malignant neoplasm of breast: Secondary | ICD-10-CM | POA: Diagnosis not present

## 2015-07-17 DIAGNOSIS — Z8249 Family history of ischemic heart disease and other diseases of the circulatory system: Secondary | ICD-10-CM | POA: Diagnosis not present

## 2015-07-17 DIAGNOSIS — J9601 Acute respiratory failure with hypoxia: Secondary | ICD-10-CM | POA: Diagnosis present

## 2015-07-17 DIAGNOSIS — I130 Hypertensive heart and chronic kidney disease with heart failure and stage 1 through stage 4 chronic kidney disease, or unspecified chronic kidney disease: Secondary | ICD-10-CM | POA: Diagnosis present

## 2015-07-17 DIAGNOSIS — Z809 Family history of malignant neoplasm, unspecified: Secondary | ICD-10-CM

## 2015-07-17 DIAGNOSIS — E039 Hypothyroidism, unspecified: Secondary | ICD-10-CM | POA: Diagnosis present

## 2015-07-17 DIAGNOSIS — R0600 Dyspnea, unspecified: Secondary | ICD-10-CM | POA: Diagnosis not present

## 2015-07-17 DIAGNOSIS — I5031 Acute diastolic (congestive) heart failure: Secondary | ICD-10-CM | POA: Diagnosis present

## 2015-07-17 DIAGNOSIS — Z79899 Other long term (current) drug therapy: Secondary | ICD-10-CM | POA: Diagnosis not present

## 2015-07-17 DIAGNOSIS — K219 Gastro-esophageal reflux disease without esophagitis: Secondary | ICD-10-CM | POA: Diagnosis present

## 2015-07-17 DIAGNOSIS — N189 Chronic kidney disease, unspecified: Secondary | ICD-10-CM | POA: Diagnosis present

## 2015-07-17 DIAGNOSIS — I2510 Atherosclerotic heart disease of native coronary artery without angina pectoris: Secondary | ICD-10-CM | POA: Diagnosis present

## 2015-07-17 DIAGNOSIS — J189 Pneumonia, unspecified organism: Secondary | ICD-10-CM

## 2015-07-17 DIAGNOSIS — E860 Dehydration: Secondary | ICD-10-CM | POA: Diagnosis present

## 2015-07-17 DIAGNOSIS — Z9889 Other specified postprocedural states: Secondary | ICD-10-CM | POA: Diagnosis not present

## 2015-07-17 DIAGNOSIS — Z823 Family history of stroke: Secondary | ICD-10-CM | POA: Diagnosis not present

## 2015-07-17 DIAGNOSIS — E11649 Type 2 diabetes mellitus with hypoglycemia without coma: Secondary | ICD-10-CM | POA: Diagnosis present

## 2015-07-17 DIAGNOSIS — Z8673 Personal history of transient ischemic attack (TIA), and cerebral infarction without residual deficits: Secondary | ICD-10-CM | POA: Diagnosis not present

## 2015-07-17 DIAGNOSIS — Z90710 Acquired absence of both cervix and uterus: Secondary | ICD-10-CM

## 2015-07-17 DIAGNOSIS — E1122 Type 2 diabetes mellitus with diabetic chronic kidney disease: Secondary | ICD-10-CM | POA: Diagnosis present

## 2015-07-17 DIAGNOSIS — E785 Hyperlipidemia, unspecified: Secondary | ICD-10-CM | POA: Diagnosis present

## 2015-07-17 DIAGNOSIS — Z923 Personal history of irradiation: Secondary | ICD-10-CM | POA: Diagnosis not present

## 2015-07-17 DIAGNOSIS — I509 Heart failure, unspecified: Secondary | ICD-10-CM

## 2015-07-17 DIAGNOSIS — M1990 Unspecified osteoarthritis, unspecified site: Secondary | ICD-10-CM | POA: Diagnosis present

## 2015-07-17 DIAGNOSIS — Z87891 Personal history of nicotine dependence: Secondary | ICD-10-CM

## 2015-07-17 DIAGNOSIS — I340 Nonrheumatic mitral (valve) insufficiency: Secondary | ICD-10-CM | POA: Diagnosis not present

## 2015-07-17 DIAGNOSIS — Z888 Allergy status to other drugs, medicaments and biological substances status: Secondary | ICD-10-CM

## 2015-07-17 DIAGNOSIS — I10 Essential (primary) hypertension: Secondary | ICD-10-CM | POA: Diagnosis not present

## 2015-07-17 DIAGNOSIS — Z951 Presence of aortocoronary bypass graft: Secondary | ICD-10-CM | POA: Diagnosis not present

## 2015-07-17 LAB — CBC WITH DIFFERENTIAL/PLATELET
Basophils Absolute: 0.1 10*3/uL (ref 0–0.1)
Basophils Relative: 1 %
Eosinophils Absolute: 0.2 10*3/uL (ref 0–0.7)
Eosinophils Relative: 2 %
HCT: 36.7 % (ref 35.0–47.0)
Hemoglobin: 12.2 g/dL (ref 12.0–16.0)
Lymphocytes Relative: 11 %
Lymphs Abs: 1 10*3/uL (ref 1.0–3.6)
MCH: 28.6 pg (ref 26.0–34.0)
MCHC: 33.3 g/dL (ref 32.0–36.0)
MCV: 85.8 fL (ref 80.0–100.0)
Monocytes Absolute: 0.6 10*3/uL (ref 0.2–0.9)
Monocytes Relative: 6 %
Neutro Abs: 7.6 10*3/uL — ABNORMAL HIGH (ref 1.4–6.5)
Neutrophils Relative %: 80 %
Platelets: 165 10*3/uL (ref 150–440)
RBC: 4.28 MIL/uL (ref 3.80–5.20)
RDW: 15.4 % — ABNORMAL HIGH (ref 11.5–14.5)
WBC: 9.4 10*3/uL (ref 3.6–11.0)

## 2015-07-17 LAB — BRAIN NATRIURETIC PEPTIDE: B Natriuretic Peptide: 818 pg/mL — ABNORMAL HIGH (ref 0.0–100.0)

## 2015-07-17 LAB — GLUCOSE, CAPILLARY
Glucose-Capillary: 160 mg/dL — ABNORMAL HIGH (ref 65–99)
Glucose-Capillary: 126 mg/dL — ABNORMAL HIGH (ref 65–99)
Glucose-Capillary: 149 mg/dL — ABNORMAL HIGH (ref 65–99)

## 2015-07-17 LAB — COMPREHENSIVE METABOLIC PANEL
ALT: 26 U/L (ref 14–54)
AST: 25 U/L (ref 15–41)
Albumin: 3.6 g/dL (ref 3.5–5.0)
Alkaline Phosphatase: 89 U/L (ref 38–126)
Anion gap: 8 (ref 5–15)
BUN: 27 mg/dL — ABNORMAL HIGH (ref 6–20)
CO2: 26 mmol/L (ref 22–32)
Calcium: 8.5 mg/dL — ABNORMAL LOW (ref 8.9–10.3)
Chloride: 106 mmol/L (ref 101–111)
Creatinine, Ser: 1.44 mg/dL — ABNORMAL HIGH (ref 0.44–1.00)
GFR calc Af Amer: 39 mL/min — ABNORMAL LOW (ref >60)
GFR calc non Af Amer: 34 mL/min — ABNORMAL LOW (ref >60)
Glucose, Bld: 162 mg/dL — ABNORMAL HIGH (ref 65–99)
Potassium: 4.4 mmol/L (ref 3.5–5.1)
Sodium: 140 mmol/L (ref 135–145)
Total Bilirubin: 0.9 mg/dL (ref 0.3–1.2)
Total Protein: 7.9 g/dL (ref 6.5–8.1)

## 2015-07-17 LAB — TROPONIN I: Troponin I: <0.03 ng/mL (ref <0.031)

## 2015-07-17 LAB — TSH: TSH: 5.837 u[IU]/mL — ABNORMAL HIGH (ref 0.350–4.500)

## 2015-07-17 MED ORDER — ENOXAPARIN SODIUM 40 MG/0.4ML ~~LOC~~ SOLN
40.0000 mg | SUBCUTANEOUS | Status: DC
  Administered 2015-07-17: 40 mg via SUBCUTANEOUS
  Filled 2015-07-17: qty 0.4

## 2015-07-17 MED ORDER — HYDRALAZINE HCL 20 MG/ML IJ SOLN
10.0000 mg | Freq: Four times a day (QID) | INTRAMUSCULAR | Status: DC | PRN
  Administered 2015-07-17 – 2015-07-20 (×3): 10 mg via INTRAVENOUS
  Filled 2015-07-17 (×3): qty 1

## 2015-07-17 MED ORDER — SODIUM CHLORIDE 0.9 % IJ SOLN
3.0000 mL | Freq: Two times a day (BID) | INTRAMUSCULAR | Status: DC
  Administered 2015-07-17 – 2015-07-18 (×3): 3 mL via INTRAVENOUS

## 2015-07-17 MED ORDER — DEXTROSE 5 % IV SOLN
500.0000 mg | Freq: Once | INTRAVENOUS | Status: AC
  Administered 2015-07-17: 500 mg via INTRAVENOUS
  Filled 2015-07-17: qty 500

## 2015-07-17 MED ORDER — HYDROCODONE-ACETAMINOPHEN 5-325 MG PO TABS: 1.0000 | ORAL_TABLET | Freq: Four times a day (QID) | ORAL | Status: DC | PRN

## 2015-07-17 MED ORDER — CLONIDINE HCL 0.1 MG PO TABS
0.2000 mg | ORAL_TABLET | Freq: Three times a day (TID) | ORAL | Status: DC
  Administered 2015-07-17 – 2015-07-20 (×8): 0.2 mg via ORAL
  Filled 2015-07-17 (×10): qty 2

## 2015-07-17 MED ORDER — ONDANSETRON HCL 4 MG/2ML IJ SOLN
4.0000 mg | INTRAMUSCULAR | Status: DC | PRN
  Administered 2015-07-17 – 2015-07-19 (×2): 4 mg via INTRAVENOUS
  Filled 2015-07-17 (×2): qty 2

## 2015-07-17 MED ORDER — CLONIDINE HCL 0.1 MG PO TABS
0.1000 mg | ORAL_TABLET | Freq: Once | ORAL | Status: AC
  Administered 2015-07-17: 0.1 mg via ORAL
  Filled 2015-07-17: qty 1

## 2015-07-17 MED ORDER — ONDANSETRON HCL 4 MG/2ML IJ SOLN: 4.0000 mg | Freq: Four times a day (QID) | INTRAMUSCULAR | Status: DC | PRN

## 2015-07-17 MED ORDER — NITROGLYCERIN 0.4 MG SL SUBL
0.4000 mg | SUBLINGUAL_TABLET | Freq: Once | SUBLINGUAL | Status: AC
  Administered 2015-07-17: 0.4 mg via SUBLINGUAL
  Filled 2015-07-17: qty 1

## 2015-07-17 MED ORDER — ALUM & MAG HYDROXIDE-SIMETH 200-200-20 MG/5ML PO SUSP: 30.0000 mL | Freq: Four times a day (QID) | ORAL | Status: DC | PRN

## 2015-07-17 MED ORDER — ONDANSETRON HCL 4 MG PO TABS
4.0000 mg | ORAL_TABLET | Freq: Four times a day (QID) | ORAL | Status: DC | PRN
  Administered 2015-07-17: 4 mg via ORAL
  Filled 2015-07-17: qty 1

## 2015-07-17 MED ORDER — METOPROLOL TARTRATE 50 MG PO TABS: 50.0000 mg | ORAL_TABLET | Freq: Two times a day (BID) | ORAL | Status: DC

## 2015-07-17 MED ORDER — ACETAMINOPHEN 650 MG RE SUPP: 650.0000 mg | Freq: Four times a day (QID) | RECTAL | Status: DC | PRN

## 2015-07-17 MED ORDER — ACETAMINOPHEN 325 MG PO TABS
650.0000 mg | ORAL_TABLET | Freq: Four times a day (QID) | ORAL | Status: DC | PRN
Start: 2015-07-17 — End: 2015-07-20
  Administered 2015-07-17 – 2015-07-19 (×4): 650 mg via ORAL
  Filled 2015-07-17 (×5): qty 2

## 2015-07-17 MED ORDER — METOPROLOL TARTRATE 50 MG PO TABS
50.0000 mg | ORAL_TABLET | Freq: Three times a day (TID) | ORAL | Status: DC
  Administered 2015-07-17 – 2015-07-20 (×8): 50 mg via ORAL
  Filled 2015-07-17 (×8): qty 1

## 2015-07-17 MED ORDER — METOPROLOL TARTRATE 50 MG PO TABS
50.0000 mg | ORAL_TABLET | Freq: Once | ORAL | Status: AC
  Administered 2015-07-17: 50 mg via ORAL
  Filled 2015-07-17: qty 1

## 2015-07-17 MED ORDER — HYDRALAZINE HCL 20 MG/ML IJ SOLN
10.0000 mg | Freq: Once | INTRAMUSCULAR | Status: AC
  Administered 2015-07-17: 10 mg via INTRAVENOUS
  Filled 2015-07-17: qty 1

## 2015-07-17 MED ORDER — LEVOFLOXACIN IN D5W 750 MG/150ML IV SOLN
750.0000 mg | INTRAVENOUS | Status: DC
  Administered 2015-07-18: 750 mg via INTRAVENOUS
  Filled 2015-07-17: qty 150

## 2015-07-17 MED ORDER — FUROSEMIDE 40 MG PO TABS: 40.0000 mg | ORAL_TABLET | Freq: Every day | ORAL | Status: DC

## 2015-07-17 MED ORDER — CALCIUM CARBONATE ANTACID 500 MG PO CHEW
1.0000 | CHEWABLE_TABLET | Freq: Every day | ORAL | Status: DC
  Administered 2015-07-17 – 2015-07-19 (×3): 200 mg via ORAL
  Filled 2015-07-17 (×4): qty 1

## 2015-07-17 MED ORDER — LEVOTHYROXINE SODIUM 125 MCG PO TABS
125.0000 ug | ORAL_TABLET | Freq: Every day | ORAL | Status: DC
  Administered 2015-07-18 – 2015-07-20 (×3): 125 ug via ORAL
  Filled 2015-07-17 (×4): qty 1

## 2015-07-17 MED ORDER — CETYLPYRIDINIUM CHLORIDE 0.05 % MT LIQD
7.0000 mL | Freq: Two times a day (BID) | OROMUCOSAL | Status: DC
  Administered 2015-07-17 – 2015-07-19 (×5): 7 mL via OROMUCOSAL

## 2015-07-17 MED ORDER — METOPROLOL TARTRATE 50 MG PO TABS
ORAL_TABLET | ORAL | Status: AC
  Filled 2015-07-17: qty 1

## 2015-07-17 MED ORDER — INSULIN ASPART 100 UNIT/ML ~~LOC~~ SOLN
0.0000 [IU] | Freq: Three times a day (TID) | SUBCUTANEOUS | Status: DC
  Administered 2015-07-17: 2 [IU] via SUBCUTANEOUS
  Administered 2015-07-17: 3 [IU] via SUBCUTANEOUS
  Filled 2015-07-17: qty 3
  Filled 2015-07-17: qty 2

## 2015-07-17 MED ORDER — CLONIDINE HCL 0.1 MG PO TABS: 0.2000 mg | ORAL_TABLET | Freq: Two times a day (BID) | ORAL | Status: DC

## 2015-07-17 MED ORDER — CYANOCOBALAMIN 1000 MCG/ML IJ SOLN
1000.0000 ug | INTRAMUSCULAR | Status: DC
  Administered 2015-07-19: 1000 ug via INTRAMUSCULAR
  Filled 2015-07-17: qty 1

## 2015-07-17 MED ORDER — FUROSEMIDE 10 MG/ML IJ SOLN
20.0000 mg | Freq: Once | INTRAMUSCULAR | Status: AC
  Administered 2015-07-17: 20 mg via INTRAVENOUS
  Filled 2015-07-17: qty 4

## 2015-07-17 MED ORDER — ASPIRIN EC 325 MG PO TBEC
325.0000 mg | DELAYED_RELEASE_TABLET | Freq: Every day | ORAL | Status: DC
  Administered 2015-07-17 – 2015-07-20 (×4): 325 mg via ORAL
  Filled 2015-07-17 (×5): qty 1

## 2015-07-17 MED ORDER — DEXTROSE 5 % IV SOLN
1.0000 g | Freq: Once | INTRAVENOUS | Status: AC
  Administered 2015-07-17: 1 g via INTRAVENOUS
  Filled 2015-07-17: qty 10

## 2015-07-17 MED ORDER — GLIMEPIRIDE 2 MG PO TABS
4.0000 mg | ORAL_TABLET | Freq: Two times a day (BID) | ORAL | Status: DC
  Administered 2015-07-18 (×2): 4 mg via ORAL
  Filled 2015-07-17 (×4): qty 2

## 2015-07-17 MED ORDER — GABAPENTIN 100 MG PO CAPS
100.0000 mg | ORAL_CAPSULE | Freq: Two times a day (BID) | ORAL | Status: DC
  Administered 2015-07-17 – 2015-07-20 (×7): 100 mg via ORAL
  Filled 2015-07-17 (×8): qty 1

## 2015-07-17 MED ORDER — ONDANSETRON HCL 4 MG PO TABS
4.0000 mg | ORAL_TABLET | ORAL | Status: DC | PRN
  Administered 2015-07-17: 4 mg via ORAL
  Filled 2015-07-17: qty 1

## 2015-07-17 MED ORDER — POTASSIUM CHLORIDE CRYS ER 20 MEQ PO TBCR: 20.0000 meq | EXTENDED_RELEASE_TABLET | Freq: Every day | ORAL | Status: DC | PRN

## 2015-07-17 MED ORDER — SENNOSIDES-DOCUSATE SODIUM 8.6-50 MG PO TABS: 1.0000 | ORAL_TABLET | Freq: Every evening | ORAL | Status: DC | PRN

## 2015-07-17 MED ORDER — HYDROCODONE-ACETAMINOPHEN 5-325 MG PO TABS: 1.0000 | ORAL_TABLET | ORAL | Status: DC | PRN

## 2015-07-17 MED ORDER — INSULIN ASPART 100 UNIT/ML ~~LOC~~ SOLN: 0.0000 [IU] | Freq: Every day | SUBCUTANEOUS | Status: DC

## 2015-07-17 NOTE — Progress Notes (Signed)
Patient unable to take scheduled PO blood pressure meds at this time due to nausea and dry heaves - Dr. Doy Hutching notified. Per MD, go ahead and give 10mg  IV push hydralazine once. Change zofran order to Q4 hours and give a dose now. Once nausea resolves, try PO blood pressure meds again.  Toniann Ket, RN

## 2015-07-17 NOTE — Progress Notes (Signed)
Patient admitted to unit. Oriented to room, call bell, and staff. Bed in lowest position. Fall safety plan reviewed. Full assessment to Epic. Skin assessment verified with Maddie Himes RN. Telemetry box verification with tele clerk, Danae Chen. Will continue to monitor.

## 2015-07-17 NOTE — H&P (Signed)
[place] [facility] [place] NAME: [name]    MR#:  [id number]  DATE OF BIRTH:  30-Jun-1934  DATE OF ADMISSION:  07/17/2015  PRIMARY CARE PHYSICIAN: SPARKS,JEFFREY D, MD   REQUESTING/REFERRING PHYSICIAN: dr Edd Fabian  CHIEF COMPLAINT:  Shortness of breath  HISTORY OF PRESENT ILLNESS:  [name]  is a 81 y.o. female with a known history of coronary artery disease/CABG and diabetes who presents with above complaint. Patient was here on Thursday for shortness of breath and diagnosed with mild CHF exacerbation. She was restarted on her Lasix which was recently discontinued due to "dehydration". She continues to have cough, shortness of breath and wheezing. In the emergency room she was given antibiotics and Lasix. EMS gave her DuoNeb treatment for wheezing.  PAST MEDICAL HISTORY:   Past Medical History  Diagnosis Date  . Breast mass, right   . Vertigo   . Thyroid disease   . History of breast cancer     39 treatments of radiation. Negative chemo.  . Diabetes ([place])   . GERD (gastroesophageal reflux disease)   . Hyperlipemia   . History of seasonal allergies   . Arthritis   . Angina pectoris ([place]) 08/26/2012  . Complication of anesthesia   . PONV (postoperative nausea and vomiting)   . Hypertension     sees Dr. Fulton Reek  . Coronary artery disease 08/22/2012    sees Dr Rockey Situ  . Pneumonia     hx of  . Headache(784.0)     migraines  . Cancer [facility])     breast cancer, right side  . Gastritis     hx of  . 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 Stroke [facility])     PAST SURGICAL HISTORY:   Past Surgical History  Procedure Laterality Date  . Abdominal hysterectomy    . Ovarian cyst removal    . Back surgery    . Appendectomy    . Breast surgery    . Laparoscopic nissen fundoplication    . Intraoperative transesophageal echocardiogram  09/05/2012   Procedure: INTRAOPERATIVE TRANSESOPHAGEAL ECHOCARDIOGRAM;  Surgeon: Rexene Alberts, MD;  Location: [place];  Service: Open Heart Surgery;  Laterality: N/A;  . Coronary artery bypass graft  09/05/2012    Procedure: CORONARY ARTERY BYPASS GRAFTING (CABG);  Surgeon: Rexene Alberts, MD;  Location: [place];  Service: Open Heart Surgery;  Laterality: N/A;  CABG x four, using left internal mammary artery and bilateral greater saphenous vein harvested endoscopically  . Cardiac catheterization  2014    SOCIAL HISTORY:   Social History  Substance Use Topics  . Smoking status: Former Smoker    Types: Cigarettes    Quit date: 11/13/1988  . Smokeless tobacco: Not on file  . Alcohol Use: No    FAMILY HISTORY:   Family History  Problem Relation Age of Onset  . Heart disease Brother     CABG & stents  . Hyperlipidemia Brother   . Hypertension Brother   . Cancer Father   . Heart disease Mother     DRUG ALLERGIES:   Allergies  Allergen Reactions  . Ciprocinonide [Fluocinolone]     Feels crazy  . Darvocet [Propoxyphene N-Acetaminophen]     Difficulty breathing and swallowing  . Duratuss G [Guaifenesin]     Other reaction(s): Unknown  . Ivp Dye [Iodinated  Diagnostic Agents]     Nausea & vomiting  . Ketek  [Telithromycin]     Other reaction(s): Unknown  . Levaquin  [Levofloxacin] Nausea Only  . Other     Reports all cholesterol medication  . Statins     Muscle aches     REVIEW OF SYSTEMS:  CONSTITUTIONAL: No fever, fatigue or weakness.  EYES: No blurred or double vision.  EARS, NOSE, AND THROAT: No tinnitus or ear pain.  RESPIRATORY: Positive cough, shortness of breath, not currently wheezing no hemoptysis.  CARDIOVASCULAR: No chest pain, orthopnea, baseline lower extremity edema.  GASTROINTESTINAL: No nausea, vomiting, diarrhea or abdominal pain.  GENITOURINARY: No dysuria, hematuria.  ENDOCRINE: No polyuria, nocturia,  HEMATOLOGY: No anemia, easy bruising or bleeding SKIN: No  rash or lesion. MUSCULOSKELETAL: No joint pain or arthritis.   NEUROLOGIC: No tingling, numbness, weakness.  PSYCHIATRY: No anxiety or depression.   MEDICATIONS AT HOME:   Prior to Admission medications   Medication Sig Start Date End Date Taking? Authorizing Provider  aspirin 325 MG EC tablet Take 325 mg by mouth daily.   Yes Historical Provider, MD  Calcium Carbonate-Vitamin D (CALCIUM 500 + D PO) Take 1 tablet by mouth daily.   Yes Historical Provider, MD  cloNIDine (CATAPRES) 0.2 MG tablet Take 1 tablet (0.2 mg total) by mouth 2 (two) times daily. 11/10/13  Yes Minna Merritts, MD  cyanocobalamin (,VITAMIN B-12,) 1000 MCG/ML injection Inject 1,000 mcg into the muscle every 30 (thirty) days.   Yes Historical Provider, MD  furosemide (LASIX) 40 MG tablet Take 1 tablet (40 mg total) by mouth daily. Patient taking differently: Take 40 mg by mouth daily as needed for fluid.  10/22/12  Yes Minna Merritts, MD  gabapentin (NEURONTIN) 100 MG capsule Take 100 mg by mouth 2 (two) times daily. 09/23/12  Yes Ivan Anchors Love, PA-C  glimepiride (AMARYL) 4 MG tablet Take 4 mg by mouth 2 (two) times daily. 09/23/12  Yes Ivan Anchors Love, PA-C  HYDROcodone-acetaminophen (NORCO/VICODIN) 5-325 MG per tablet Take 1 tablet by mouth every 6 (six) hours as needed for moderate pain.   Yes Historical Provider, MD  levothyroxine (SYNTHROID, LEVOTHROID) 125 MCG tablet Take 125 mcg by mouth daily.   Yes Historical Provider, MD  metoprolol (LOPRESSOR) 50 MG tablet Take 1 tablet (50 mg total) by mouth 2 (two) times daily. Note change in dosage of medication 10/20/13  Yes Minna Merritts, MD  Omega-3 Fatty Acids (FISH OIL) 1000 MG CAPS Take 1,000 mg by mouth daily.    Yes Historical Provider, MD  potassium chloride SA (K-DUR,KLOR-CON) 20 MEQ tablet Take 20 mEq by mouth daily. Take with furosemide.   Yes Minna Merritts, MD      VITAL SIGNS:  Blood pressure 189/96, pulse 90, temperature 97.5 F (36.4 C), temperature  source Oral, resp. rate 20, height 5\' 4"  (1.626 m), weight 101.152 kg (223 lb), SpO2 97 %.  PHYSICAL EXAMINATION:  GENERAL:  81 y.o.-year-old patient lying in the bed with no acute distress.  EYES: Pupils equal, round, reactive to light and accommodation. No scleral icterus. Extraocular muscles intact.  HEENT: Head atraumatic, normocephalic. Oropharynx and nasopharynx clear.  NECK:  Supple, no jugular venous distention. No thyroid enlargement, no tenderness.  LUNGS: Normal breath sounds bilaterally, no wheezing, rales,rhonchi or crepitation. No use of accessory muscles of respiration.  CARDIOVASCULAR: S1, S2 normal. No murmurs, rubs, or gallops.  ABDOMEN: Soft, nontender, nondistended. Bowel sounds present. No organomegaly or mass.  EXTREMITIES:  Mild pedal edema, cyanosis, or clubbing.  NEUROLOGIC: Cranial nerves II through XII are grossly intact. No focal deficits. PSYCHIATRIC: The patient is alert and oriented x 3.  SKIN: No obvious rash, lesion, or ulcer.   LABORATORY PANEL:   CBC  Recent Labs Lab 07/17/15 0806  WBC 9.4  HGB 12.2  HCT 36.7  PLT 165   ------------------------------------------------------------------------------------------------------------------  Chemistries   Recent Labs Lab 07/17/15 0806  NA 140  K 4.4  CL 106  CO2 26  GLUCOSE 162*  BUN 27*  CREATININE 1.44*  CALCIUM 8.5*  AST 25  ALT 26  ALKPHOS 89  BILITOT 0.9   ------------------------------------------------------------------------------------------------------------------  Cardiac Enzymes  Recent Labs Lab 07/15/15 0932 07/17/15 0806  TROPONINI <0.03 <0.03   ------------------------------------------------------------------------------------------------------------------  RADIOLOGY:  Dg Chest Portable 1 View  07/17/2015  CLINICAL DATA:  Shortness of breath, difficulty breathing, wheezing EXAM: PORTABLE CHEST 1 VIEW COMPARISON:  07/15/2015 FINDINGS: Patchy left lower lobe/  lingular opacity, with additional mild right lower lobe opacity, suspicious for multifocal pneumonia. No pleural effusion or pneumothorax. Cardiomegaly. IMPRESSION: Mild patchy bilateral lower lobe and lingular opacities, suspicious for pneumonia. Electronically Signed   By: Julian Hy M.D.   On: 07/17/2015 07:54    EKG:    Normal sinus rhythm no ST elevation or depression IMPRESSION AND PLAN:  81 year old female with a history of hypertension and coronary disease presents with shortness of breath and acute hypoxic respiratory failure.  1. Acute hypoxic respiratory failure: I suspect this is related to pneumonia. Patient does have a slightly elevated BNP and has very minimal pitting edema. She does not have a history of congestive heart failure. I will continue antibiotics for her pneumonia. I have ordered 2-D echocardiogram to evaluate for congestive heart failure. I will also have Dr. Rockey Situ her cardiologist assess her for congestive heart failure. Blood cultures were ordered in the emergency room which need to be followed up on.  2. Diabetes: Patient will continue outpatient medications. Patient will be placed on sinus [place] and ADA diet.  3. Essential hypertension: Continue clonidine and metoprolol  4. Hypothyroidism: Continue Synthroid     All the records are reviewed and case discussed with ED provider. Management plans discussed with the patient and she is in agreement.  CODE STATUS: FULL  TOTAL TIME TAKING CARE OF THIS PATIENT: 45 minutes.    Shamir Tuzzolino M.D on 07/17/2015 at 12:29 PM  Between 7am to 6pm - Pager - [phone] After 6pm go to [email or web address] - password [email or web address] [facility]  Office  [phone]  CC: Primary care physician; Idelle Crouch, MD

## 2015-07-17 NOTE — ED Notes (Signed)
Lab notified that Ed staff unable to obtain blood draw. Will send tech to draw.

## 2015-07-17 NOTE — Progress Notes (Signed)
*  PRELIMINARY RESULTS* Echocardiogram 2D Echocardiogram has been performed.  [name] Stills 07/17/2015, 12:16 PM

## 2015-07-17 NOTE — Progress Notes (Signed)
ANTIBIOTIC CONSULT NOTE - INITIAL  Pharmacy Consult for Levaquin Indication: pneumonia  Allergies  Allergen Reactions  . Ciprocinonide [Fluocinolone]     Feels crazy  . Darvocet [Propoxyphene N-Acetaminophen]     Difficulty breathing and swallowing  . Duratuss G [Guaifenesin]     Other reaction(s): Unknown  . Ivp Dye [Iodinated Diagnostic Agents]     Nausea & vomiting  . Ketek  [Telithromycin]     Other reaction(s): Unknown  . Levaquin  [Levofloxacin] Nausea Only  . Other     Reports all cholesterol medication  . Statins     Muscle aches    Patient Measurements: Height: 5\' 4"  (162.6 cm) Weight: 223 lb (101.152 kg) IBW/kg (Calculated) : 54.7  Vital Signs: Temp: 97.8 F (36.6 C) (10/15 0717) Temp Source: Oral (10/15 0717) BP: 160/86 mmHg (10/15 1050) Pulse Rate: 88 (10/15 1050) Intake/Output from previous day:   Intake/Output from this shift: Total I/O In: -  Out: 150 [Urine:150]  Labs:  Recent Labs  07/15/15 0932 07/17/15 0806  WBC 11.2* 9.4  HGB 12.1 12.2  PLT 151 165  CREATININE 1.54* 1.44*   Estimated Creatinine Clearance: 37.3 mL/min (by C-G formula based on Cr of 1.44). No results for input(s): VANCOTROUGH, VANCOPEAK, VANCORANDOM, GENTTROUGH, GENTPEAK, GENTRANDOM, TOBRATROUGH, TOBRAPEAK, TOBRARND, AMIKACINPEAK, AMIKACINTROU, AMIKACIN in the last 72 hours.   Microbiology: No results found for this or any previous visit (from the past 720 hour(s)).  Medical History: Past Medical History  Diagnosis Date  . Breast mass, right   . Vertigo   . Thyroid disease   . History of breast cancer     39 treatments of radiation. Negative chemo.  . Diabetes ([place])   . GERD (gastroesophageal reflux disease)   . Hyperlipemia   . History of seasonal allergies   . Arthritis   . Angina pectoris ([place]) 08/26/2012  . Complication of anesthesia   . PONV (postoperative nausea and vomiting)   . Hypertension     sees Dr. Fulton Reek  . Coronary artery disease  08/22/2012    sees Dr Rockey Situ  . Pneumonia     hx of  . Headache(784.0)     migraines  . Cancer [facility])     breast cancer, right side  . Gastritis     hx of  . 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 Stroke [facility])     Medications:  Scheduled:  . aspirin  325 mg Oral Daily  . calcium carbonate  1 tablet Oral Daily  . cloNIDine  0.2 mg Oral BID  . cyanocobalamin  1,000 mcg Intramuscular Q30 days  . enoxaparin (LOVENOX) injection  40 mg Subcutaneous Q24H  . furosemide  40 mg Oral Daily  . gabapentin  100 mg Oral BID  . glimepiride  4 mg Oral BID WC  . insulin aspart  0-15 Units Subcutaneous TID WC  . insulin aspart  0-5 Units Subcutaneous QHS  . [START ON 07/18/2015] levofloxacin (LEVAQUIN) IV  750 mg Intravenous Q48H  . [START ON 07/18/2015] levothyroxine  125 mcg Oral QAC breakfast  . metoprolol  50 mg Oral BID  . sodium chloride  3 mL Intravenous Q12H   Assessment: 81 yo female admitted for community acquire pneumonia.  Pt received one time doses of azithromycin and ceftriaxone in the ED. Pharmacy consulted to dose levaquin in this patient.  Goal of Therapy:  Resolution  of infection  Plan:  Based on current renal function of 37.3 ml/min will start levaquin 750mg  IV Q48H starting tomorrow ~24 hours after doses of ceftriaxone and azithromycin.  Continue to monitor renal function and adjust dose as needed. Expected duration 7 days with resolution of temperature and/or normalization of WBC  Norma Fredrickson, PharmD Clinical Pharmacist 07/17/2015,11:22 AM

## 2015-07-17 NOTE — Progress Notes (Signed)
Per Dr. Benjie Karvonen, start PO lasix tomorrow. IV dose already given in ED.

## 2015-07-17 NOTE — ED Notes (Signed)
Pt arrived via EMS from home.  Was seen here on Thursday for shortness of breath and swelling in ankles. C/o today of gasping for breath and difficulty breathing.  EMS states patient was wheezing and was given 1 Duoneb and 1 albuterol which did help per patient.

## 2015-07-17 NOTE — Progress Notes (Signed)
Spoke with Dr. Doy Hutching regarding patient's BP, but not quite time for PRN hydralazine yet. Per MD, go ahead and give another dose, MD to adjust meds as needed.  [name]

## 2015-07-17 NOTE — ED Provider Notes (Addendum)
[facility] Emergency Department Provider Note  ____________________________________________  Time seen: Approximately 7:23 AM  I have reviewed the triage vital signs and the nursing notes.   HISTORY  Chief Complaint Shortness of Breath    HPI [name] is a 81 y.o. female past medical history of hyperlipidemia, hypertension, arthritis, CVA affecting her right side, CABG 2012, presents to the emergency department with shortness of breath, gradual onset and worsening over the past 4 days, worse with lying flat. She had recently been taken off of her Lasix due to "dehydration". She was seen here on 07/15/2015 for mild CHF exacerbation, started on by mouth Lasix daily with plans to see her primary care doctor in 2 days however this morning she awoke "gasping for air". No chest pain. She has had cough. No vomiting, diarrhea, fevers or chills. Currently her symptoms are moderate. EMS gave her DuoNeb treatment for perceived wheezing. No modifying factors.   Past Medical History  Diagnosis Date  . Breast mass, right   . Vertigo   . Thyroid disease   . History of breast cancer     39 treatments of radiation. Negative chemo.  . Diabetes ([place])   . GERD (gastroesophageal reflux disease)   . Hyperlipemia   . History of seasonal allergies   . Arthritis   . Angina pectoris ([place]) 08/26/2012  . Complication of anesthesia   . PONV (postoperative nausea and vomiting)   . Hypertension     sees Dr. Fulton Reek  . Coronary artery disease 08/22/2012    sees Dr Rockey Situ  . Pneumonia     hx of  . Headache(784.0)     migraines  . Cancer [facility])     breast cancer, right side  . Gastritis     hx of  . 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 Stroke [facility])     Patient Active Problem List   Diagnosis Date Noted  . Cerebral embolism with cerebral infarction ([place]) 09/07/2012  . Hemiplegia, unspecified,  affecting dominant side 09/07/2012  . 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 Angina pectoris ([place]) 08/26/2012  . Coronary artery disease 08/22/2012  . Throat pain 08/19/2012  . Shortness of breath 08/19/2012  . Hyperlipidemia 08/19/2012  . Diabetes ([place]) 08/19/2012  . Hypertension 08/19/2012  . Breast cancer ([place]) 08/19/2012  . GERD (gastroesophageal reflux disease) 08/19/2012    Past Surgical History  Procedure Laterality Date  . Abdominal hysterectomy    . Ovarian cyst removal    . Back surgery    . Appendectomy    . Breast surgery    . Laparoscopic nissen fundoplication    . Intraoperative transesophageal echocardiogram  09/05/2012    Procedure: INTRAOPERATIVE TRANSESOPHAGEAL ECHOCARDIOGRAM;  Surgeon: Rexene Alberts, MD;  Location: [place];  Service: Open Heart Surgery;  Laterality: N/A;  . Coronary artery bypass graft  09/05/2012    Procedure: CORONARY ARTERY BYPASS GRAFTING (CABG);  Surgeon: Rexene Alberts, MD;  Location: [place];  Service: Open Heart Surgery;  Laterality: N/A;  CABG x four, using left internal mammary artery and bilateral greater saphenous vein harvested endoscopically  . Cardiac catheterization  2014    Current Outpatient Rx  Name  Route  Sig  Dispense  Refill  . aspirin 325 MG EC tablet   Oral   Take 325 mg by mouth daily.         Marland Kitchen  calcium carbonate (TUMS - DOSED IN MG ELEMENTAL CALCIUM) 500 MG chewable tablet   Oral   Chew 1 tablet by mouth daily.         . cefUROXime (CEFTIN) 500 MG tablet   Oral   Take 500 mg by mouth 2 (two) times daily. For 10 days         . cloNIDine (CATAPRES) 0.2 MG tablet   Oral   Take 1 tablet (0.2 mg total) by mouth 2 (two) times daily.   180 tablet   3   . cyanocobalamin (,VITAMIN B-12,) 1000 MCG/ML injection   Intramuscular   Inject 1,000 mcg into the muscle every 30 (thirty) days.         . furosemide (LASIX) 40 MG tablet   Oral   Take 1 tablet (40 mg total) by mouth daily. Patient taking differently: Take 40 mg  by mouth daily as needed for fluid.    90 tablet   3   . gabapentin (NEURONTIN) 100 MG capsule   Oral   Take 100 mg by mouth 2 (two) times daily.         Marland Kitchen glimepiride (AMARYL) 4 MG tablet   Oral   Take 4 mg by mouth 2 (two) times daily.         Marland Kitchen HYDROcodone-acetaminophen (NORCO/VICODIN) 5-325 MG per tablet   Oral   Take 1 tablet by mouth every 6 (six) hours as needed for moderate pain.         Marland Kitchen levothyroxine (SYNTHROID, LEVOTHROID) 125 MCG tablet   Oral   Take 125 mcg by mouth daily.         . metoprolol (LOPRESSOR) 50 MG tablet   Oral   Take 1 tablet (50 mg total) by mouth 2 (two) times daily. Note change in dosage of medication   180 tablet   3   . Omega-3 Fatty Acids (FISH OIL) 1000 MG CAPS   Oral   Take 1,000 mg by mouth daily.          . potassium chloride SA (K-DUR,KLOR-CON) 20 MEQ tablet   Oral   Take 20 mEq by mouth daily as needed (with furosemide).            Allergies Ciprocinonide; Darvocet; Ivp dye; Other; and Statins  Family History  Problem Relation Age of Onset  . Heart disease Brother     CABG & stents  . Hyperlipidemia Brother   . Hypertension Brother   . Cancer Father   . Heart disease Mother     Social History Social History  Substance Use Topics  . Smoking status: Former Smoker    Types: Cigarettes    Quit date: 11/13/1988  . Smokeless tobacco: None  . Alcohol Use: No    Review of Systems Constitutional: No fever/chills Eyes: No visual changes. ENT: No sore throat. Cardiovascular: Denies chest pain. Respiratory: +shortness of breath. Gastrointestinal: No abdominal pain.  No nausea, no vomiting.  No diarrhea.  No constipation. Genitourinary: Negative for dysuria. Musculoskeletal: Negative for back pain. Skin: Negative for rash. Neurological: Negative for headaches, focal weakness or numbness.  10-point ROS otherwise negative.  ____________________________________________   PHYSICAL EXAM:  VITAL SIGNS: ED  Triage Vitals  Enc Vitals Group     BP 07/17/15 0717 216/102 mmHg     Pulse Rate 07/17/15 0717 104     Resp 07/17/15 0717 26     Temp 07/17/15 0717 97.8 F (36.6 C)     Temp Source  07/17/15 0717 Oral     SpO2 07/17/15 0717 93 %     Weight 07/17/15 0717 223 lb (101.152 kg)     Height 07/17/15 0717 5\' 4"  (1.626 m)     Head Cir --      Peak Flow --      Pain Score 07/17/15 0719 0     Pain Loc --      Pain Edu? --      Excl. in [place]? --     Constitutional: Alert and oriented. In mild respiratory distress. Eyes: Conjunctivae are normal. PERRL. EOMI. Head: Atraumatic. Nose: No congestion/rhinnorhea. Mouth/Throat: Mucous membranes are moist.  Oropharynx non-erythematous. Neck: No stridor. Cardiovascular: mildly tachycardic rate, regular rhythm. Grossly normal heart sounds.  Good peripheral circulation. Respiratory: tachypnea with increased work of breathing. Rales bilateral bases. Gastrointestinal: Soft and nontender. No distention.  No CVA tenderness. Genitourinary: deferred Musculoskeletal: 3+ pitting edema bilateral lower extremities. Neurologic:  Normal speech and language. No gross focal neurologic deficits are appreciated. No gait instability. Skin:  Skin is warm, dry and intact. No rash noted. Psychiatric: Mood and affect are normal. Speech and behavior are normal.  ____________________________________________   LABS (all labs ordered are listed, but only abnormal results are displayed)  Labs Reviewed  CBC WITH DIFFERENTIAL/PLATELET - Abnormal; Notable for the following:    RDW 15.4 (*)    Neutro Abs 7.6 (*)    All other components within normal limits  COMPREHENSIVE METABOLIC PANEL - Abnormal; Notable for the following:    Glucose, Bld 162 (*)    BUN 27 (*)    Creatinine, Ser 1.44 (*)    Calcium 8.5 (*)    GFR calc non Af Amer 34 (*)    GFR calc Af Amer 39 (*)    All other components within normal limits  BRAIN NATRIURETIC PEPTIDE - Abnormal; Notable for the  following:    B Natriuretic Peptide 818.0 (*)    All other components within normal limits  CULTURE, BLOOD (ROUTINE X 2)  CULTURE, BLOOD (ROUTINE X 2)  TROPONIN I   ____________________________________________  EKG   ED ECG REPORT I, Joanne Gavel, the attending physician, personally viewed and interpreted this ECG.   Date: 07/17/2015  EKG Time: 07:14  Rate: 105 bpm  Rhythm: normal EKG, normal sinus rhythm, unchanged from previous tracings, sinus tachycardia  Axis: left axis  Intervals:none  ST&T Change: No acute ST elevation. Q waves in lead 3, aVF, V2, V3  ____________________________________________  RADIOLOGY  CXR  IMPRESSION: Mild patchy bilateral lower lobe and lingular opacities, suspicious for pneumonia. ____________________________________________   PROCEDURES  Procedure(s) performed: None  Critical Care performed: No  ____________________________________________   INITIAL IMPRESSION / ASSESSMENT AND PLAN / ED COURSE  Pertinent labs & imaging results that were available during my care of the patient were reviewed by me and considered in my medical decision making (see chart for details).  [name] is a 81 y.o. female past medical history of hyperlipidemia, hypertension, arthritis, CVA affecting her right side, CABG 2012, presents to the emergency department with shortness of breath. On exam, she is mildly tachypnea with rales in bilateral bases. Additionally she does have 3+ pitting edema bilateral lower extremities. Chest x-ray consistent with multifocal pneumonia however clinically she also appears volume overloaded with mild CHF exacerbation. Labs reviewed. CBC is unremarkable. CMP with mild creatinine elevation which is stable. BNP is elevated at 818. First troponin is negative. Ceftriaxone, azithromycin ordered. I have also ordered 20  of IV Lasix. Case discussed with Dr. Margaretmary Eddy, hospitalist, at 9:20 AM for  admission. ____________________________________________   FINAL CLINICAL IMPRESSION(S) / ED DIAGNOSES  Final diagnoses:  Congestive heart failure, unspecified congestive heart failure chronicity, unspecified congestive heart failure type [facility])  Community acquired pneumonia      Joanne Gavel, MD 07/17/15 [id number]  Joanne Gavel, MD 07/17/15 [phone]

## 2015-07-18 DIAGNOSIS — I10 Essential (primary) hypertension: Secondary | ICD-10-CM

## 2015-07-18 DIAGNOSIS — R0600 Dyspnea, unspecified: Secondary | ICD-10-CM

## 2015-07-18 DIAGNOSIS — I5031 Acute diastolic (congestive) heart failure: Secondary | ICD-10-CM

## 2015-07-18 DIAGNOSIS — I2510 Atherosclerotic heart disease of native coronary artery without angina pectoris: Secondary | ICD-10-CM

## 2015-07-18 LAB — BASIC METABOLIC PANEL
Anion gap: 6 (ref 5–15)
BUN: 32 mg/dL — ABNORMAL HIGH (ref 6–20)
CO2: 29 mmol/L (ref 22–32)
Calcium: 8.4 mg/dL — ABNORMAL LOW (ref 8.9–10.3)
Chloride: 106 mmol/L (ref 101–111)
Creatinine, Ser: 1.98 mg/dL — ABNORMAL HIGH (ref 0.44–1.00)
GFR calc Af Amer: 27 mL/min — ABNORMAL LOW (ref >60)
GFR calc non Af Amer: 23 mL/min — ABNORMAL LOW (ref >60)
Glucose, Bld: 110 mg/dL — ABNORMAL HIGH (ref 65–99)
Potassium: 4.7 mmol/L (ref 3.5–5.1)
Sodium: 141 mmol/L (ref 135–145)

## 2015-07-18 LAB — GLUCOSE, CAPILLARY
Glucose-Capillary: 109 mg/dL — ABNORMAL HIGH (ref 65–99)
Glucose-Capillary: 119 mg/dL — ABNORMAL HIGH (ref 65–99)
Glucose-Capillary: 59 mg/dL — ABNORMAL LOW (ref 65–99)
Glucose-Capillary: 62 mg/dL — ABNORMAL LOW (ref 65–99)
Glucose-Capillary: 79 mg/dL (ref 65–99)
Glucose-Capillary: 83 mg/dL (ref 65–99)

## 2015-07-18 MED ORDER — ENOXAPARIN SODIUM 30 MG/0.3ML ~~LOC~~ SOLN
30.0000 mg | SUBCUTANEOUS | Status: DC
  Administered 2015-07-18: 30 mg via SUBCUTANEOUS
  Filled 2015-07-18: qty 0.3

## 2015-07-18 MED ORDER — IPRATROPIUM-ALBUTEROL 0.5-2.5 (3) MG/3ML IN SOLN
3.0000 mL | Freq: Four times a day (QID) | RESPIRATORY_TRACT | Status: DC
  Administered 2015-07-18 – 2015-07-20 (×7): 3 mL via RESPIRATORY_TRACT
  Filled 2015-07-18 (×8): qty 3

## 2015-07-18 MED ORDER — ISOSORBIDE MONONITRATE ER 30 MG PO TB24
30.0000 mg | ORAL_TABLET | Freq: Two times a day (BID) | ORAL | Status: DC
  Administered 2015-07-18 – 2015-07-19 (×3): 30 mg via ORAL
  Filled 2015-07-18 (×4): qty 1

## 2015-07-18 NOTE — Progress Notes (Signed)
Pt had worsening of renal function with CrCl now <30 ml/min.  Lovenox adjusted to 30mg  SubQ Q24H per protocol.  Norma Fredrickson, PharmD Clinical Pharmacist 07/18/2015

## 2015-07-18 NOTE — Progress Notes (Signed)
[name] is a 81 y.o. female  <principal problem not specified>   SUBJECTIVE:    Pt in NAD. Stable on RA this AM. Less SOB. Renal fxn worse this AM. Echo and Cardiology consult pending. Denies CP. BP still elevated. Nausea improved.  ______________________________________________________________________  ROS: Review of systems is unremarkable for any active cardiac,respiratory, GI, GU, hematologic, neurologic or psychiatric systems, 10 systems reviewed.  @CMEDLIST @  Past Medical History  Diagnosis Date  . Breast mass, right   . Vertigo   . Thyroid disease   . History of breast cancer     39 treatments of radiation. Negative chemo.  . Diabetes ([place])   . GERD (gastroesophageal reflux disease)   . Hyperlipemia   . History of seasonal allergies   . Arthritis   . Angina pectoris ([place]) 08/26/2012  . Complication of anesthesia   . PONV (postoperative nausea and vomiting)   . Hypertension     sees Dr. Fulton Reek  . Coronary artery disease 08/22/2012    sees Dr Rockey Situ  . Pneumonia     hx of  . Headache(784.0)     migraines  . Cancer [facility])     breast cancer, right side  . Gastritis     hx of  . 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 Stroke [facility])     Past Surgical History  Procedure Laterality Date  . Abdominal hysterectomy    . Ovarian cyst removal    . Back surgery    . Appendectomy    . Breast surgery    . Laparoscopic nissen fundoplication    . Intraoperative transesophageal echocardiogram  09/05/2012    Procedure: INTRAOPERATIVE TRANSESOPHAGEAL ECHOCARDIOGRAM;  Surgeon: Rexene Alberts, MD;  Location: [place];  Service: Open Heart Surgery;  Laterality: N/A;  . Coronary artery bypass graft  09/05/2012    Procedure: CORONARY ARTERY BYPASS GRAFTING (CABG);  Surgeon: Rexene Alberts, MD;  Location: [place];  Service: Open Heart Surgery;  Laterality: N/A;  CABG x four, using left internal mammary  artery and bilateral greater saphenous vein harvested endoscopically  . Cardiac catheterization  2014    PHYSICAL EXAM:  BP 180/88 mmHg  Pulse 99  Temp(Src) 97.6 F (36.4 C) (Oral)  Resp 20  Ht 5\' 4"  (1.626 m)  Wt 102.513 kg (226 lb)  BMI 38.77 kg/m2  SpO2 93%  Wt Readings from Last 3 Encounters:  07/17/15 102.513 kg (226 lb)  07/15/15 101.152 kg (223 lb)  01/27/15 102.286 kg (225 lb 8 oz)            Constitutional: NAD Neck: supple, no thyromegaly Respiratory: scattered rhonchi Cardiovascular: RRR, no murmur, no gallop Abdomen: soft, good BS, nontender Extremities: no edema Neuro: alert and oriented, no focal motor or sensory deficits  ASSESSMENT/PLAN:  Labs and imaging studies were reviewed  Will continue IV ABX and add SVN's. Optimize BP regimen. Follow sugars. Hold Lasix due to worsening renal fxn. Await Cardiology consult and echo results. Repeat labs and CXR in AM.

## 2015-07-18 NOTE — Progress Notes (Signed)
Patient's CBG at 2118 was 59 g/dL. Patient was asymptomatic while in hypoglycemic episode. Peanut butter, gram crackers and orange juice were offered to patient. Re-checked CBG was 79 mg/dL. Husband at bedside, no issues.  Will continue to monitor.

## 2015-07-18 NOTE — Progress Notes (Signed)
SATURATION QUALIFICATIONS: (This note is used to comply with regulatory documentation for home oxygen)  Patient Saturations on Room Air at Rest = 94%  Patient Saturations on Room Air while Ambulating = 85%  Patient Saturations on 2 Liters of oxygen while Ambulating = 91%  Please briefly explain why patient needs home oxygen:  

## 2015-07-18 NOTE — Progress Notes (Signed)
Physical Therapy Evaluation Patient Details Name: [name] MRN: [id number] DOB: [birth year] Today's Date: 07/18/2015   History of Present Illness  Pt is a 81 yo female with history of CAD s/p CABG, DM, HLD, HTN, prior CVA and breast cancer admitted with SOB and found to have mild volume overload with possible pneumonia. She is being managed with antibiotics and diuresed with IV Lasix.   Clinical Impression  Pt presents with decreased tolerance to ambulation with O2 sats dropping to 85-86% on room with seated rest.   2L O2 donned and O2 sats up to 91%.   Educated pt on oxygen demands with exercises and energy conservation techniques.  Pt would benefit from acute PT services to address objective findings.  Recommend HHPT at discharge.    Follow Up Recommendations Home health PT    Equipment Recommendations  Rolling walker with 5" wheels    Recommendations for Other Services       Precautions / Restrictions Precautions Precautions: Fall Restrictions Weight Bearing Restrictions: No      Mobility  Bed Mobility               General bed mobility comments: supine<>sit with modified independence  Transfers                 General transfer comment: Sit<>stand with SBA, no device, pushing up from bed  Ambulation/Gait             General Gait Details: 200' with HHA on room air with slight staggering gait but able to maintain balance.  Stairs            Wheelchair Mobility    Modified Rankin (Stroke Patients Only)       Balance Overall balance assessment: Modified Independent                                           Pertinent Vitals/Pain      Home Living Family/patient expects to be discharged to:: Private residence Living Arrangements: Spouse/significant other   Type of Home: House Home Access: Stairs to enter Entrance Stairs-Rails: None Entrance Stairs-Number of Steps: 3 Home Layout: One level Home Equipment:  Environmental consultant - 2 wheels;Cane - single point      Prior Function Level of Independence: Needs assistance         Comments: "holds onto husband"     Hand Dominance        Extremity/Trunk Assessment   Upper Extremity Assessment: Overall WFL for tasks assessed           Lower Extremity Assessment: Overall WFL for tasks assessed;Generalized weakness         Communication   Communication: No difficulties  Cognition Arousal/Alertness: Awake/alert Behavior During Therapy: WFL for tasks assessed/performed Overall Cognitive Status: Within Functional Limits for tasks assessed                      General Comments General comments (skin integrity, edema, etc.): O2 sats drop to 85-86% on RA after ambulation and did not go up with seated rest; RN notified and 2 L O2 via [place] applied    Exercises        Assessment/Plan    PT Assessment Patient needs continued PT services  PT Diagnosis Abnormality of gait;Generalized weakness   PT Problem List Decreased activity tolerance;Decreased mobility;Cardiopulmonary status limiting activity  PT Treatment  Interventions Gait training;Stair training;Functional mobility training;Therapeutic activities;Therapeutic exercise;Balance training;Patient/family education   PT Goals (Current goals can be found in the Care Plan section) Acute Rehab PT Goals Patient Stated Goal: "I want to go home tomorrow." PT Goal Formulation: With patient Time For Goal Achievement: 08/01/15 Potential to Achieve Goals: Good    Frequency Min 2X/week   Barriers to discharge        Co-evaluation               End of Session Equipment Utilized During Treatment: Gait belt Activity Tolerance: Patient limited by fatigue Patient left: in bed;with call bell/phone within reach;with family/visitor present Nurse Communication: Mobility status;Other (comment) (O2 sats)         Time: [id number] PT Time Calculation (min) (ACUTE ONLY): 25 min   Charges:      PT Treatments $Therapeutic Exercise: 8-22 mins   PT G Codes:        Jammy Stlouis A Jaedon Siler 08-07-2015, 4:33 PM

## 2015-07-18 NOTE — Consult Note (Signed)
Patient ID: [name] MRN: [id number] DOB/AGE: 09-Jul-1934 81 y.o.  Admit date: 07/17/2015 Primary Cardiologist: Rockey Situ Reason for Consultation: CHF  HPI: 81 yo female with history of CAD s/p CABG, DM, HLD, HTN, prior CVA and breast cancer admitted with SOB and found to have mild volume overload with possible pneumonia. She is being managed with antibiotics and diuresed with IV Lasix. She is feeling better since admission with improved SOB. She is s/p 4VCABG in December 2013. LVEF=50-55% in 2013. No echo since then. She has been on chronic diuretic therapy which was held 7 days ago due to dehydration in setting of UTI. She has had no chest pain at home. Dyspnea has been gradually worsening this week.   She is feeling better today. No chest pain, palpitations. Breathing is improved.     Past Medical History  Diagnosis Date  . Breast mass, right   . Vertigo   . Thyroid disease   . History of breast cancer     39 treatments of radiation. Negative chemo.  . Diabetes ([place])   . GERD (gastroesophageal reflux disease)   . Hyperlipemia   . History of seasonal allergies   . Arthritis   . Angina pectoris ([place]) 08/26/2012  . Complication of anesthesia   . PONV (postoperative nausea and vomiting)   . Hypertension     sees Dr. Fulton Reek  . Coronary artery disease 08/22/2012    sees Dr Rockey Situ  . Pneumonia     hx of  . Headache(784.0)     migraines  . Cancer [facility])     breast cancer, right side  . Gastritis     hx of  . 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 Stroke [facility])     Family History  Problem Relation Age of Onset  . Heart disease Brother     CABG & stents  . Hyperlipidemia Brother   . Hypertension Brother   . Cancer Father   . Heart disease Mother     Social History   Social History  . Marital Status: Married    Spouse Name: N/A  . Number of Children: 0  . Years of Education: N/A    Occupational History  . retired    Social History Main Topics  . Smoking status: Former Smoker    Types: Cigarettes    Quit date: 11/13/1988  . Smokeless tobacco: Not on file  . Alcohol Use: No  . Drug Use: No  . Sexual Activity: Not on file   Other Topics Concern  . Not on file   Social History Narrative    Past Surgical History  Procedure Laterality Date  . Abdominal hysterectomy    . Ovarian cyst removal    . Back surgery    . Appendectomy    . Breast surgery    . Laparoscopic nissen fundoplication    . Intraoperative transesophageal echocardiogram  09/05/2012    Procedure: INTRAOPERATIVE TRANSESOPHAGEAL ECHOCARDIOGRAM;  Surgeon: Rexene Alberts, MD;  Location: [place];  Service: Open Heart Surgery;  Laterality: N/A;  . Coronary artery bypass graft  09/05/2012    Procedure: CORONARY ARTERY BYPASS GRAFTING (CABG);  Surgeon: Rexene Alberts, MD;  Location: [place];  Service: Open Heart Surgery;  Laterality: N/A;  CABG x four, using left internal mammary artery and bilateral greater saphenous vein harvested endoscopically  . Cardiac  catheterization  2014    Allergies  Allergen Reactions  . Ciprocinonide [Fluocinolone]     Feels crazy  . Darvocet [Propoxyphene N-Acetaminophen]     Difficulty breathing and swallowing  . Duratuss G [Guaifenesin]     Other reaction(s): Unknown  . Ivp Dye [Iodinated Diagnostic Agents]     Nausea & vomiting  . Ketek  [Telithromycin]     Other reaction(s): Unknown  . Levaquin  [Levofloxacin] Nausea Only  . Other     Reports all cholesterol medication  . Statins     Muscle aches   Hospital Medications:   . antiseptic oral rinse  7 mL Mouth Rinse BID  . aspirin  325 mg Oral Daily  . calcium carbonate  1 tablet Oral Daily  . cloNIDine  0.2 mg Oral TID  . cyanocobalamin  1,000 mcg Intramuscular Q30 days  . enoxaparin (LOVENOX) injection  40 mg Subcutaneous Q24H  . gabapentin  100 mg Oral BID  . glimepiride  4 mg Oral BID WC  . insulin  aspart  0-15 Units Subcutaneous TID WC  . insulin aspart  0-5 Units Subcutaneous QHS  . ipratropium-albuterol  3 mL Nebulization QID  . isosorbide mononitrate  30 mg Oral BID  . levofloxacin (LEVAQUIN) IV  750 mg Intravenous Q48H  . levothyroxine  125 mcg Oral QAC breakfast  . metoprolol  50 mg Oral Q8H  . sodium chloride  3 mL Intravenous Q12H    Review of systems complete and found to be negative unless listed above    Physical Exam: Blood pressure 180/88, pulse 99, temperature 97.6 F (36.4 C), temperature source Oral, resp. rate 20, height 5\' 4"  (1.626 m), weight 226 lb (102.513 kg), SpO2 93 %.    General: Well developed, well nourished, NAD  HEENT: OP clear, mucus membranes moist  SKIN: warm, dry. No rashes.  Neuro: No focal deficits  Musculoskeletal: Muscle strength 5/5 all ext  Psychiatric: Mood and affect normal  Neck: No JVD, no carotid bruits, no thyromegaly, no lymphadenopathy.  Lungs: Wheezes bilaterally with decreased BS bilaterally Cardiovascular: Regular rate and rhythm. No murmurs, gallops or rubs.  Abdomen:Soft. Bowel sounds present. Non-tender.  Extremities: No lower extremity edema. Pulses are 2 + in the bilateral DP/PT.  Labs:   Lab Results  Component Value Date   WBC 9.4 07/17/2015   HGB 12.2 07/17/2015   HCT 36.7 07/17/2015   MCV 85.8 07/17/2015   PLT 165 07/17/2015     Recent Labs Lab 07/17/15 0806 07/18/15 0424  NA 140 141  K 4.4 4.7  CL 106 106  CO2 26 29  BUN 27* 32*  CREATININE 1.44* 1.98*  CALCIUM 8.5* 8.4*  PROT 7.9  --   BILITOT 0.9  --   ALKPHOS 89  --   ALT 26  --   AST 25  --   GLUCOSE 162* 110*   Lab Results  Component Value Date   TROPONINI <0.03 07/17/2015      Chest x-ray:  Patchy left lower lobe/ lingular opacity, with additional mild right lower lobe opacity, suspicious for multifocal pneumonia. No pleural effusion or pneumothorax. Cardiomegaly. IMPRESSION: Mild patchy bilateral lower lobe and lingular  opacities, suspicious for pneumonia.  EKG: sinus tach, rate 105 bpm. Non-specific T wave abn  Echo: Official report to follow. Prelim read with LVEF=50-55%.   ASSESSMENT AND PLAN:   1. Acute respiratory distress: Likely combination of mild CHF and pneumonia. I think this is predominantly due to her pulmonary issues.  2. Acute diastolic CHF: She has been given IV Lasix and I/O even. Renal function is worsened this am. She does not appear to be volume overloaded this am. I have reviewed her echo and my preliminary read is that her LVEF is 50-55% with no significant valve disease. Agree with holding diuretics today.   3. Pneumonia: Per primary team.   4. CAD: Stable. No evidence of ACS.   5. HTN: Meds being adjusted by primary team. Imdur has been added.    Signed: Lauree Chandler, MD 07/18/2015, 9:55 AM

## 2015-07-19 ENCOUNTER — Inpatient Hospital Stay: Payer: Medicare Other

## 2015-07-19 LAB — CBC WITH DIFFERENTIAL/PLATELET
Basophils Absolute: 0.1 10*3/uL (ref 0–0.1)
Basophils Relative: 1 %
Eosinophils Absolute: 0.3 10*3/uL (ref 0–0.7)
Eosinophils Relative: 3 %
HCT: 30.9 % — ABNORMAL LOW (ref 35.0–47.0)
Hemoglobin: 10.3 g/dL — ABNORMAL LOW (ref 12.0–16.0)
Lymphocytes Relative: 18 %
Lymphs Abs: 1.4 10*3/uL (ref 1.0–3.6)
MCH: 28.4 pg (ref 26.0–34.0)
MCHC: 33.2 g/dL (ref 32.0–36.0)
MCV: 85.5 fL (ref 80.0–100.0)
Monocytes Absolute: 0.7 10*3/uL (ref 0.2–0.9)
Monocytes Relative: 9 %
Neutro Abs: 5.3 10*3/uL (ref 1.4–6.5)
Neutrophils Relative %: 69 %
Platelets: 157 10*3/uL (ref 150–440)
RBC: 3.62 MIL/uL — ABNORMAL LOW (ref 3.80–5.20)
RDW: 15.3 % — ABNORMAL HIGH (ref 11.5–14.5)
WBC: 7.7 10*3/uL (ref 3.6–11.0)

## 2015-07-19 LAB — BASIC METABOLIC PANEL
Anion gap: 5 (ref 5–15)
BUN: 37 mg/dL — ABNORMAL HIGH (ref 6–20)
CO2: 29 mmol/L (ref 22–32)
Calcium: 7.9 mg/dL — ABNORMAL LOW (ref 8.9–10.3)
Chloride: 106 mmol/L (ref 101–111)
Creatinine, Ser: 1.84 mg/dL — ABNORMAL HIGH (ref 0.44–1.00)
GFR calc Af Amer: 29 mL/min — ABNORMAL LOW (ref >60)
GFR calc non Af Amer: 25 mL/min — ABNORMAL LOW (ref >60)
Glucose, Bld: 69 mg/dL (ref 65–99)
Potassium: 4.9 mmol/L (ref 3.5–5.1)
Sodium: 140 mmol/L (ref 135–145)

## 2015-07-19 LAB — GLUCOSE, CAPILLARY
Glucose-Capillary: 110 mg/dL — ABNORMAL HIGH (ref 65–99)
Glucose-Capillary: 56 mg/dL — ABNORMAL LOW (ref 65–99)
Glucose-Capillary: 91 mg/dL (ref 65–99)
Glucose-Capillary: 114 mg/dL — ABNORMAL HIGH (ref 65–99)
Glucose-Capillary: 97 mg/dL (ref 65–99)
Glucose-Capillary: 130 mg/dL — ABNORMAL HIGH (ref 65–99)

## 2015-07-19 MED ORDER — AMLODIPINE BESYLATE 5 MG PO TABS
5.0000 mg | ORAL_TABLET | Freq: Every day | ORAL | Status: DC
  Administered 2015-07-19 – 2015-07-20 (×2): 5 mg via ORAL
  Filled 2015-07-19 (×2): qty 1

## 2015-07-19 MED ORDER — LEVOFLOXACIN 750 MG PO TABS
750.0000 mg | ORAL_TABLET | ORAL | Status: DC
  Administered 2015-07-20: 750 mg via ORAL
  Filled 2015-07-19: qty 1

## 2015-07-19 MED ORDER — ENOXAPARIN SODIUM 40 MG/0.4ML ~~LOC~~ SOLN
40.0000 mg | SUBCUTANEOUS | Status: DC
  Administered 2015-07-19: 40 mg via SUBCUTANEOUS
  Filled 2015-07-19: qty 0.4

## 2015-07-19 MED ORDER — DEXTROSE-NACL 5-0.9 % IV SOLN
INTRAVENOUS | Status: DC
  Administered 2015-07-19 (×2): via INTRAVENOUS

## 2015-07-19 MED ORDER — ZOLPIDEM TARTRATE 5 MG PO TABS
5.0000 mg | ORAL_TABLET | Freq: Every evening | ORAL | Status: DC | PRN
  Administered 2015-07-19: 5 mg via ORAL
  Filled 2015-07-19: qty 1

## 2015-07-19 NOTE — Progress Notes (Addendum)
[name] is a 81 y.o. female  <principal problem not specified>   SUBJECTIVE:  Pt in NAD Remains hypoxic. Weak, not eating. Afebrile. WBC normal. Cardiology consult appreciated. Sugars have been low.  ______________________________________________________________________  ROS: Review of systems is unremarkable for any active cardiac,respiratory, GI, GU, hematologic, neurologic or psychiatric systems, 10 systems reviewed.  @CMEDLIST @  Past Medical History  Diagnosis Date  . Breast mass, right   . Vertigo   . Thyroid disease   . History of breast cancer     39 treatments of radiation. Negative chemo.  . Diabetes ([place])   . GERD (gastroesophageal reflux disease)   . Hyperlipemia   . History of seasonal allergies   . Arthritis   . Angina pectoris ([place]) 08/26/2012  . Complication of anesthesia   . PONV (postoperative nausea and vomiting)   . Hypertension     sees Dr. Fulton Reek  . Coronary artery disease 08/22/2012    sees Dr Rockey Situ  . Pneumonia     hx of  . Headache(784.0)     migraines  . Cancer [facility])     breast cancer, right side  . Gastritis     hx of  . 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 Stroke [facility])     Past Surgical History  Procedure Laterality Date  . Abdominal hysterectomy    . Ovarian cyst removal    . Back surgery    . Appendectomy    . Breast surgery    . Laparoscopic nissen fundoplication    . Intraoperative transesophageal echocardiogram  09/05/2012    Procedure: INTRAOPERATIVE TRANSESOPHAGEAL ECHOCARDIOGRAM;  Surgeon: Rexene Alberts, MD;  Location: [place];  Service: Open Heart Surgery;  Laterality: N/A;  . Coronary artery bypass graft  09/05/2012    Procedure: CORONARY ARTERY BYPASS GRAFTING (CABG);  Surgeon: Rexene Alberts, MD;  Location: [place];  Service: Open Heart Surgery;  Laterality: N/A;  CABG x four, using left internal mammary artery and bilateral greater  saphenous vein harvested endoscopically  . Cardiac catheterization  2014    PHYSICAL EXAM:  BP 167/81 mmHg  Pulse 89  Temp(Src) 97.6 F (36.4 C) (Oral)  Resp 20  Ht 6\' 2"  (1.88 m)  Wt 133.448 kg (294 lb 3.2 oz)  BMI 37.76 kg/m2  SpO2 95%  Wt Readings from Last 3 Encounters:  07/18/15 133.448 kg (294 lb 3.2 oz)  07/15/15 101.152 kg (223 lb)  01/27/15 102.286 kg (225 lb 8 oz)            Constitutional: NAD Neck: supple, no thyromegaly Respiratory: diffuse rhonchi Cardiovascular: RRR, no murmur, no gallop Abdomen: soft, good BS, nontender Extremities: 1+ edema Neuro: alert and oriented, no focal motor or sensory deficits  ASSESSMENT/PLAN:  Labs and imaging studies were reviewed  CXR today. Hold Amaryl. Continue IV fluids. PT eval today. May require placement. Continue IV ABX. Repeat labs in AM.  Pt has acute on chronic renal failure due to dehydration with underlying CKD. Holding Lasix for now

## 2015-07-19 NOTE — Clinical Documentation Improvement (Signed)
Internal Medicine  Please further clarify what "Hold Lasix due to worsening renal fxn" means and update your documentation within the medical record to reflect your response to this query. Thank you.  Based on the clinical findings below, please document any associated diagnoses/conditions the patient has or may have.   ARF  Chronic Kidney Disease along with Stage if known  ARF and CKD with Stage  Other  Clinically Undetermined  Supporting Information:  Creatinines have ranged from 1.44 to1.98, 1.84 over a 48 hour time frame.   White female with GFR's running from 34 to 23, 25 for current admission  Please exercise your independent, professional judgment when responding. A specific answer is not anticipated or expected.  Thank You, Zoila Shutter BSN, [place] [phone]

## 2015-07-19 NOTE — Progress Notes (Signed)
CBG at 2347 =62 mg/dl, Patient is alert, oriented and asymptomatic. Hypoglycemic protocol initiated the second time but patient refused to eat anything the second time. Patient stated they have tighten her esophageal sphincter and as such she cannot eat much and she also said she was full from previous snack. Patient also indicated if she eats anything, she will throw it right back up. Dr. Doy Hutching notified to see if we could start some fluid since patient refused to eat. New order for Dextrose 5% in NS at 75 mL/hr received. Patient's CBG after starting the dextrose IV is 110 mg/dL. Will continue to monitor.

## 2015-07-19 NOTE — Plan of Care (Signed)
Problem: Phase I Progression Outcomes Goal: Hemodynamically stable Outcome: Not Met (add Reason) Patient is having hypoglycemic episodes this shift.     

## 2015-07-19 NOTE — Progress Notes (Signed)
ANTIBIOTIC CONSULT NOTE - Follow Up  Pharmacy Consult for Levaquin Indication: pneumonia  Allergies  Allergen Reactions  . Ciprocinonide [Fluocinolone]     Feels crazy  . Darvocet [Propoxyphene N-Acetaminophen]     Difficulty breathing and swallowing  . Duratuss G [Guaifenesin]     Other reaction(s): Unknown  . Ivp Dye [Iodinated Diagnostic Agents]     Nausea & vomiting  . Ketek  [Telithromycin]     Other reaction(s): Unknown  . Levaquin  [Levofloxacin] Nausea Only  . Other     Reports all cholesterol medication  . Statins     Muscle aches    Patient Measurements: Height: 6\' 2"  (188 cm) (stated) Weight: 294 lb 3.2 oz (133.448 kg) (admission) IBW/kg (Calculated) : 77.7  Vital Signs: Temp: 97.6 F (36.4 C) (10/17 0621) Temp Source: Oral (10/17 0621) BP: 167/81 mmHg (10/17 0621) Pulse Rate: 89 (10/17 0621) Intake/Output from previous day: 10/16 0701 - 10/17 0700 In: 720 [P.O.:720] Out: 200 [Urine:200] Intake/Output from this shift: Total I/O In: 430 [I.V.:430] Out: -   Labs:  Recent Labs  07/17/15 0806 07/18/15 0424 07/19/15 0559  WBC 9.4  --  7.7  HGB 12.2  --  10.3*  PLT 165  --  157  CREATININE 1.44* 1.98* 1.84*   Estimated Creatinine Clearance: 39.8 mL/min (by C-G formula based on Cr of 1.84). No results for input(s): VANCOTROUGH, VANCOPEAK, VANCORANDOM, GENTTROUGH, GENTPEAK, GENTRANDOM, TOBRATROUGH, TOBRAPEAK, TOBRARND, AMIKACINPEAK, AMIKACINTROU, AMIKACIN in the last 72 hours.   Microbiology: Recent Results (from the past 720 hour(s))  Blood culture (routine x 2)     Status: None (Preliminary result)   Collection Time: 07/17/15  8:06 AM  Result Value Ref Range Status   Specimen Description BLOOD RIGHT ARM  Final   Special Requests BOTTLES DRAWN AEROBIC AND ANAEROBIC  8CC  Final   Culture NO GROWTH < 12 HOURS  Final   Report Status PENDING  Incomplete  Blood culture (routine x 2)     Status: None (Preliminary result)   Collection Time: 07/17/15   8:24 AM  Result Value Ref Range Status   Specimen Description BLOOD RIGHT HAND  Final   Special Requests BOTTLES DRAWN AEROBIC AND ANAEROBIC  5CC  Final   Culture NO GROWTH < 12 HOURS  Final   Report Status PENDING  Incomplete    Medical History: Past Medical History  Diagnosis Date  . Breast mass, right   . Vertigo   . Thyroid disease   . History of breast cancer     39 treatments of radiation. Negative chemo.  . Diabetes ([place])   . GERD (gastroesophageal reflux disease)   . Hyperlipemia   . History of seasonal allergies   . Arthritis   . Angina pectoris ([place]) 08/26/2012  . Complication of anesthesia   . PONV (postoperative nausea and vomiting)   . Hypertension     sees Dr. Fulton Reek  . Coronary artery disease 08/22/2012    sees Dr Rockey Situ  . Pneumonia     hx of  . Headache(784.0)     migraines  . Cancer [facility])     breast cancer, right side  . Gastritis     hx of  . 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 Stroke [facility])     Medications:  Scheduled:  . antiseptic oral rinse  7 mL Mouth Rinse BID  .  aspirin  325 mg Oral Daily  . calcium carbonate  1 tablet Oral Daily  . cloNIDine  0.2 mg Oral TID  . cyanocobalamin  1,000 mcg Intramuscular Q30 days  . enoxaparin (LOVENOX) injection  40 mg Subcutaneous Q24H  . gabapentin  100 mg Oral BID  . insulin aspart  0-15 Units Subcutaneous TID WC  . insulin aspart  0-5 Units Subcutaneous QHS  . ipratropium-albuterol  3 mL Nebulization QID  . isosorbide mononitrate  30 mg Oral BID  . levofloxacin (LEVAQUIN) IV  750 mg Intravenous Q48H  . levothyroxine  125 mcg Oral QAC breakfast  . metoprolol  50 mg Oral Q8H  . sodium chloride  3 mL Intravenous Q12H   Assessment: 81 yo female admitted for community acquire pneumonia.  Pharmacy consulted to dose levaquin in this patient.  Goal of Therapy:  Resolution of infection  Plan:  Based on current renal function  of ~40 mL will continue Levaquin 750 mg pO q48h.  Patient meets criteria for IV to PO transition based on following criteria:    1.  Patient has White Blood Count < 11.1 x 103/mm3    2.  Patient has maintained temperature < 99.5 Fahrenheit for twenty-four hours 3.  Patient has eaten > 50% of meals for twenty-four hours 4.  Patient is taking other medications by mouth  Continue to monitor renal function and adjust dose as needed.  Murrell Converse, PharmD Clinical Pharmacist 07/19/2015

## 2015-07-19 NOTE — Progress Notes (Signed)
Notified physician per patient request for insomnia medication, orders received.

## 2015-07-19 NOTE — Progress Notes (Signed)
Patient: [name] / Admit Date: 07/17/2015 / Date of Encounter: 07/19/2015, 3:21 PM   Subjective: Breathing much better. O2 sats in the mid 90's off oxygen. Ambulating in the hallways without issues. Echo showed normal LV function, no RWMA, GR1DD, mild MR, mildly elevated PASP.   Review of Systems: Review of Systems  Constitutional: Positive for malaise/fatigue. Negative for fever, chills, weight loss and diaphoresis.  HENT: Negative for congestion.   Eyes: Negative for discharge and redness.  Respiratory: Positive for cough, shortness of breath and wheezing.   Cardiovascular: Negative for chest pain and palpitations.  Gastrointestinal: Negative for nausea and vomiting.  Musculoskeletal: Negative for falls.  Skin: Negative for rash.  Neurological: Positive for weakness.  Endo/Heme/Allergies: Bruises/bleeds easily.  Psychiatric/Behavioral: The patient is not nervous/anxious.      Objective: Telemetry: NSR, 90's Physical Exam: Blood pressure 167/79, pulse 90, temperature 97.8 F (36.6 C), temperature source Oral, resp. rate 18, height 6\' 2"  (1.88 m), weight 294 lb 3.2 oz (133.448 kg), SpO2 93 %. Body mass index is 37.76 kg/(m^2). General: Well developed, well nourished, in no acute distress. Head: Normocephalic, atraumatic, sclera non-icteric, no xanthomas, nares are without discharge. Neck: Negative for carotid bruits. JVP not elevated. Lungs: Clear bilaterally to auscultation without wheezes, rales, or rhonchi. Breathing is unlabored. Heart: RRR S1 S2 without murmurs, rubs, or gallops.  Abdomen: Obese, soft, non-tender, non-distended with normoactive bowel sounds. No rebound/guarding. Extremities: No clubbing or cyanosis. No edema. Distal pedal pulses are 2+ and equal bilaterally. Neuro: Alert and oriented X 3. Moves all extremities spontaneously. Psych:  Responds to questions appropriately with a normal affect.   Intake/Output Summary (Last 24 hours) at 07/19/15  1521 Last data filed at 07/19/15 1346  Gross per 24 hour  Intake   1030 ml  Output    850 ml  Net    180 ml    Inpatient Medications:  . antiseptic oral rinse  7 mL Mouth Rinse BID  . aspirin  325 mg Oral Daily  . calcium carbonate  1 tablet Oral Daily  . cloNIDine  0.2 mg Oral TID  . cyanocobalamin  1,000 mcg Intramuscular Q30 days  . enoxaparin (LOVENOX) injection  40 mg Subcutaneous Q24H  . gabapentin  100 mg Oral BID  . insulin aspart  0-15 Units Subcutaneous TID WC  . insulin aspart  0-5 Units Subcutaneous QHS  . ipratropium-albuterol  3 mL Nebulization QID  . isosorbide mononitrate  30 mg Oral BID  . [START ON 07/20/2015] levofloxacin  750 mg Oral Q48H  . levothyroxine  125 mcg Oral QAC breakfast  . metoprolol  50 mg Oral Q8H  . sodium chloride  3 mL Intravenous Q12H   Infusions:  . dextrose 5 % and 0.9% NaCl 50 mL/hr at 07/19/15 0821    Labs:  Recent Labs  07/18/15 0424 07/19/15 0559  NA 141 140  K 4.7 4.9  CL 106 106  CO2 29 29  GLUCOSE 110* 69  BUN 32* 37*  CREATININE 1.98* 1.84*  CALCIUM 8.4* 7.9*    Recent Labs  07/17/15 0806  AST 25  ALT 26  ALKPHOS 89  BILITOT 0.9  PROT 7.9  ALBUMIN 3.6    Recent Labs  07/17/15 0806 07/19/15 0559  WBC 9.4 7.7  NEUTROABS 7.6* 5.3  HGB 12.2 10.3*  HCT 36.7 30.9*  MCV 85.8 85.5  PLT 165 157    Recent Labs  07/17/15 0806  TROPONINI <0.03   Invalid input(s): POCBNP  No results for input(s): HGBA1C in the last 72 hours.   Weights: Filed Weights   07/17/15 0717 07/17/15 1149 07/18/15 1141  Weight: 223 lb (101.152 kg) 226 lb (102.513 kg) 294 lb 3.2 oz (133.448 kg)     Radiology/Studies:  Dg Chest 2 View  07/19/2015  CLINICAL DATA:  Cough. EXAM: CHEST  2 VIEW COMPARISON:  July 17, 2015. FINDINGS: Stable cardiomegaly. Status post coronary artery bypass graft. No pneumothorax is noted. Mild bilateral pleural effusions are noted. Linear left midlung opacity is noted concerning for subsegmental  atelectasis. Mild bibasilar opacities are noted concerning for subsegmental atelectasis. Bony thorax is unremarkable. IMPRESSION: Left midlung subsegmental atelectasis and bibasilar subsegmental atelectasis is noted. Mild bilateral pleural effusions are noted posteriorly. Electronically Signed   By: Marijo Conception, M.D.   On: 07/19/2015 07:44   Dg Chest 2 View  07/15/2015  CLINICAL DATA:  Shortness of breath. Wheezing. Nonproductive cough. EXAM: CHEST  2 VIEW COMPARISON:  05/04/2014 FINDINGS: Slight cardiomegaly. Tiny left effusion. Slight interstitial accentuation at the bases with peribronchial thickening. This could represent bronchitis or slight interstitial edema. Pulmonary vascularity is normal. Accentuation of the thoracic kyphosis. CABG. No IMPRESSION: Small left effusion. Interstitial accentuation which could be secondary to edema or bronchitis. Electronically Signed   By: Lorriane Shire M.D.   On: 07/15/2015 10:13   Dg Chest Portable 1 View  07/17/2015  CLINICAL DATA:  Shortness of breath, difficulty breathing, wheezing EXAM: PORTABLE CHEST 1 VIEW COMPARISON:  07/15/2015 FINDINGS: Patchy left lower lobe/ lingular opacity, with additional mild right lower lobe opacity, suspicious for multifocal pneumonia. No pleural effusion or pneumothorax. Cardiomegaly. IMPRESSION: Mild patchy bilateral lower lobe and lingular opacities, suspicious for pneumonia. Electronically Signed   By: Julian Hy M.D.   On: 07/17/2015 07:54     Assessment and Plan   1. Acute respiratory distress:  -Likely combination of PNA and possibly mild diastolic CHF -Most likely predominately pulmonary issue at hand -Improved -Should her SOB persist could pursue outpatient stress testing at later date  2. Mild acute diastolic CHF: -Echo as above -Diuresis on hold, symptoms improving   3. PNA: -Per primary team  4. CAD: -Stable -No evidence of ACS  5. HTN: -Uncontrolled, continue current medications -  Amlodipine 5 mg once daily was added.    SignedChristell Faith, PA-C Pager: [phone] 07/19/2015, 3:21 PM    I have seen and examined the patient. I agree with the above note which was modified by me as needed.   Kathlyn Sacramento MD, [facility] 07/19/2015 4:45 PM

## 2015-07-19 NOTE — Progress Notes (Signed)
Anticoagulation Policy-Lovenox Prophylaxis  Patient is a 81 yo female receiving Lovenox for DVT prophylaxis.  Originally ordered Lovenox 40 mg subq q24h and transitioned to 30 mg due to decline in renal function.  Est CrCl today of ~40 mL/min. Will transition patient to Lovenox 40 mg subq q24h per anticoagulation policy.   Pharmacy will continue to follow and monitor per policy.  Murrell Converse, PharmD Clinical Pharmacist 07/19/2015

## 2015-07-19 NOTE — Care Management Note (Signed)
Case Management Note  Patient Details  Name: MARICRUZ MUHLESTEIN MRN: [id number] Date of Birth: November 13, 1933  Subjective/Objective:                 Patient presents from home with shortness of breath.  Patient lives at home with her husband.  Patient obtains her medications from [place].  Patient states that she has a BSC, shower chair, and standard walker at home, however she does not use any of these.  Patient states that she does not have O2 home.  Patient and husband both drive.  PT recommending home health PT.  Patient states that she does not want home health PT arranged at this time.  I told her I would come back tomorrow to follow up.  Patient 02 sats dropped to 87% with exertion.    Action/Plan: If patient requires O2 at the time of discharge will require qualifying O2 sats, and diagnosis.  RNCM to continue to follow  Expected Discharge Date:                  Expected Discharge Plan:     In-House Referral:     Discharge planning Services     Post Acute Care Choice:    Choice offered to:     DME Arranged:    DME Agency:     HH Arranged:    [place] Agency:     Status of Service:     Medicare Important Message Given:    Date Medicare IM Given:    Medicare IM give by:    Date Additional Medicare IM Given:    Additional Medicare Important Message give by:     If discussed at [place] of Stay Meetings, dates discussed:    Additional Comments:  Beverly Sessions, RN 07/19/2015, 2:16 PM

## 2015-07-19 NOTE — Clinical Social Work Note (Signed)
CSW was consulted by MD for nursing home placement. RN CM was also consulted. PT has recommended home health. RN CM following patient and currently patient is refusing home health at this time. Please reconsult CSW if needed. Shela Leff MSW,LCSW [phone]

## 2015-07-19 NOTE — Progress Notes (Signed)
Initial Nutrition Assessment   INTERVENTION:   Coordination of Care: recommend daily weights. Also recommend clarifying height.  Meals and Snacks: Cater to patient preferences on Heart healthy/Carb modified diet order. Will send bedtime snack of sugar free pudding as pt usually eats chocolate yogurt as bedtime snack.   NUTRITION DIAGNOSIS:   Inadequate oral intake related to acute illness as evidenced by per patient/family report.  GOAL:   Patient will meet greater than or equal to 90% of their needs  MONITOR:    (Energy Intake, Anthropometrics, Glucose Profile)  REASON FOR ASSESSMENT:   Consult Poor PO  ASSESSMENT:   Pt admitted with SOB secondary to pna. Per MD note, pt with recent admission Thursday with SOB at that time per mild CHF. Noted cardiology following. Pt hypoglycemic last night.  Past Medical History  Diagnosis Date  . Breast mass, right   . Vertigo   . Thyroid disease   . History of breast cancer     39 treatments of radiation. Negative chemo.  . Diabetes ([place])   . GERD (gastroesophageal reflux disease)   . Hyperlipemia   . History of seasonal allergies   . Arthritis   . Angina pectoris ([place]) 08/26/2012  . Complication of anesthesia   . PONV (postoperative nausea and vomiting)   . Hypertension     sees Dr. Fulton Reek  . Coronary artery disease 08/22/2012    sees Dr Rockey Situ  . Pneumonia     hx of  . Headache(784.0)     migraines  . Cancer [facility])     breast cancer, right side  . Gastritis     hx of  . 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 Stroke [facility])     Diet Order:  Diet heart healthy/carb modified Room service appropriate?: Yes; Fluid consistency:: Thin   Current Nutrition: Pt reports eating 100% of lunch today pot roast, mashed potatoes and corn. Pt eating 80-100% of meals recorded since admission. Pt reports having orange juice this am when hypoglycemic.     Food/Nutrition-Related History: Pt reports not eating as well starting Thursday when first admitted to ED with SOB; reports it improved Friday but starting Saturday has not had a good appetite. Pt reports today have been eating much better. Pt reports usual po intake of breakfast, peanut butter crackers for lunch and dinner of meat and vegetables PTA. Pt later reports 'I do like sweets' but know not eat too many. Pt reports not checking her blood sugars daily, only when symptomatic.  Pt also reports having to eat 'healthy' for >20 years and has been watching carbohydrates and eating heart healthy.    Scheduled Medications:  . antiseptic oral rinse  7 mL Mouth Rinse BID  . aspirin  325 mg Oral Daily  . calcium carbonate  1 tablet Oral Daily  . cloNIDine  0.2 mg Oral TID  . cyanocobalamin  1,000 mcg Intramuscular Q30 days  . enoxaparin (LOVENOX) injection  40 mg Subcutaneous Q24H  . gabapentin  100 mg Oral BID  . insulin aspart  0-15 Units Subcutaneous TID WC  . insulin aspart  0-5 Units Subcutaneous QHS  . ipratropium-albuterol  3 mL Nebulization QID  . isosorbide mononitrate  30 mg Oral BID  . [START ON 07/20/2015] levofloxacin  750 mg Oral Q48H  . levothyroxine  125 mcg Oral QAC breakfast  . metoprolol  50 mg  Oral Q8H  . sodium chloride  3 mL Intravenous Q12H    Continuous Medications:  . dextrose 5 % and 0.9% NaCl 50 mL/hr at 07/19/15 0821     Electrolyte/Renal Profile and Glucose Profile:   Recent Labs Lab 07/17/15 0806 07/18/15 0424 07/19/15 0559  NA 140 141 140  K 4.4 4.7 4.9  CL 106 106 106  CO2 26 29 29   BUN 27* 32* 37*  CREATININE 1.44* 1.98* 1.84*  CALCIUM 8.5* 8.4* 7.9*  GLUCOSE 162* 110* 69   Protein Profile:  Recent Labs Lab 07/15/15 0932 07/17/15 0806  ALBUMIN 3.2* 3.6    Gastrointestinal Profile: Last BM:  07/19/2015   Nutrition-Focused Physical Exam Findings: Nutrition-Focused physical exam completed. Findings are WDL for fat depletion, muscle  depletion, and 2+ BLLE and 1+BLUE edema per Nsg documentation.    Weight Change: Pt reports stable weight PTA. RD notes standing weight recorded on admission of 294lbs, however 3 days ago recorded at 223lbs. And 6 months ago 225lbs. RD reviewed notes from 3 years ago in [facility], which reports weight of 209-220lbs. Question height of 6'2"   Height:   Ht Readings from Last 1 Encounters:  07/18/15 6\' 2"  (1.88 m)    Weight:   Wt Readings from Last 1 Encounters:  07/18/15 294 lb 3.2 oz (133.448 kg)    Wt Readings from Last 10 Encounters:  07/18/15 294 lb 3.2 oz (133.448 kg)  07/15/15 223 lb (101.152 kg)  01/27/15 225 lb 8 oz (102.286 kg)  01/12/14 220 lb 8 oz (100.018 kg)  07/14/13 209 lb 8 oz (95.029 kg)  01/15/13 200 lb 8 oz (90.946 kg)  10/22/12 204 lb 8 oz (92.761 kg)  10/14/12 200 lb (90.719 kg)  09/23/12 205 lb 7.5 oz (93.2 kg)  09/10/12 214 lb 12.8 oz (97.433 kg)     BMI:  Body mass index is 37.76 kg/(m^2).  Estimated Nutritional Needs:   Kcal:  BEE: 1339kcals, TEE: (IF 1.1-1.3)(AF 1.2) 1839-2174kcals, using IBW of 77kg  Protein:  62-77g protein (0.8-1.0g/kg)  Fluid:  1925-2340mL of fluid (25-72mL/kg)    MODERATE Care Level  Dwyane Luo, RD, LDN Pager [phone]

## 2015-07-20 LAB — CBC WITH DIFFERENTIAL/PLATELET
Basophils Absolute: 0.1 10*3/uL (ref 0–0.1)
Basophils Relative: 1 %
Eosinophils Absolute: 0.4 10*3/uL (ref 0–0.7)
Eosinophils Relative: 4 %
HCT: 32 % — ABNORMAL LOW (ref 35.0–47.0)
Hemoglobin: 10.7 g/dL — ABNORMAL LOW (ref 12.0–16.0)
Lymphocytes Relative: 16 %
Lymphs Abs: 1.3 10*3/uL (ref 1.0–3.6)
MCH: 28.8 pg (ref 26.0–34.0)
MCHC: 33.4 g/dL (ref 32.0–36.0)
MCV: 86.1 fL (ref 80.0–100.0)
Monocytes Absolute: 0.5 10*3/uL (ref 0.2–0.9)
Monocytes Relative: 7 %
Neutro Abs: 6 10*3/uL (ref 1.4–6.5)
Neutrophils Relative %: 72 %
Platelets: 155 10*3/uL (ref 150–440)
RBC: 3.72 MIL/uL — ABNORMAL LOW (ref 3.80–5.20)
RDW: 15.9 % — ABNORMAL HIGH (ref 11.5–14.5)
WBC: 8.2 10*3/uL (ref 3.6–11.0)

## 2015-07-20 LAB — BASIC METABOLIC PANEL
Anion gap: 6 (ref 5–15)
BUN: 29 mg/dL — ABNORMAL HIGH (ref 6–20)
CO2: 28 mmol/L (ref 22–32)
Calcium: 8.1 mg/dL — ABNORMAL LOW (ref 8.9–10.3)
Chloride: 106 mmol/L (ref 101–111)
Creatinine, Ser: 1.46 mg/dL — ABNORMAL HIGH (ref 0.44–1.00)
GFR calc Af Amer: 39 mL/min — ABNORMAL LOW (ref >60)
GFR calc non Af Amer: 33 mL/min — ABNORMAL LOW (ref >60)
Glucose, Bld: 169 mg/dL — ABNORMAL HIGH (ref 65–99)
Potassium: 4.9 mmol/L (ref 3.5–5.1)
Sodium: 140 mmol/L (ref 135–145)

## 2015-07-20 LAB — GLUCOSE, CAPILLARY: Glucose-Capillary: 175 mg/dL — ABNORMAL HIGH (ref 65–99)

## 2015-07-20 MED ORDER — IPRATROPIUM-ALBUTEROL 0.5-2.5 (3) MG/3ML IN SOLN: 3.0000 mL | Freq: Four times a day (QID) | RESPIRATORY_TRACT | Status: DC

## 2015-07-20 MED ORDER — ZOLPIDEM TARTRATE 5 MG PO TABS: 5.0000 mg | ORAL_TABLET | Freq: Every evening | ORAL | Status: DC | PRN

## 2015-07-20 MED ORDER — ONDANSETRON HCL 4 MG PO TABS: 4.0000 mg | ORAL_TABLET | ORAL | Status: DC | PRN

## 2015-07-20 MED ORDER — CLONIDINE HCL 0.2 MG PO TABS: 0.2000 mg | ORAL_TABLET | Freq: Three times a day (TID) | ORAL | Status: DC

## 2015-07-20 MED ORDER — HYDRALAZINE HCL 50 MG PO TABS
50.0000 mg | ORAL_TABLET | Freq: Two times a day (BID) | ORAL | Status: DC
  Administered 2015-07-20: 50 mg via ORAL
  Filled 2015-07-20: qty 1

## 2015-07-20 MED ORDER — LEVOFLOXACIN 750 MG PO TABS: 750.0000 mg | ORAL_TABLET | ORAL | Status: DC

## 2015-07-20 MED ORDER — AMLODIPINE BESYLATE 5 MG PO TABS: 5.0000 mg | ORAL_TABLET | Freq: Every day | ORAL | Status: DC

## 2015-07-20 MED ORDER — HYDROCODONE-ACETAMINOPHEN 5-325 MG PO TABS: 1.0000 | ORAL_TABLET | ORAL | Status: DC | PRN

## 2015-07-20 MED ORDER — HYDRALAZINE HCL 50 MG PO TABS: 50.0000 mg | ORAL_TABLET | Freq: Two times a day (BID) | ORAL | Status: DC

## 2015-07-20 MED ORDER — METOPROLOL TARTRATE 50 MG PO TABS: 50.0000 mg | ORAL_TABLET | Freq: Three times a day (TID) | ORAL | Status: DC

## 2015-07-20 NOTE — Discharge Instructions (Signed)
Call if worse

## 2015-07-20 NOTE — Discharge Summary (Signed)
[name], is a 81 y.o. female  DOB August 15, 1934  MRN [id number].  Admission date:  07/17/2015  Admitting Physician  Bettey Costa, MD  Discharge Date:  07/20/2015   Primary MD  Broderic Bara D, MD  Recommendations for primary care physician for things to follow:     Admission Diagnosis  Community acquired pneumonia [J18.9] Congestive heart failure, unspecified congestive heart failure chronicity, unspecified congestive heart failure type ([place]) [I50.9]   Discharge Diagnosis  Community acquired pneumonia [J18.9] Congestive heart failure, unspecified congestive heart failure chronicity, unspecified congestive heart failure type ([place]) [I50.9]  DM, HTN. Dehydration, acute on chronic renal failure  Active Problems:   Pneumonia      Past Medical History  Diagnosis Date  . Breast mass, right   . Vertigo   . Thyroid disease   . History of breast cancer     39 treatments of radiation. Negative chemo.  . Diabetes ([place])   . GERD (gastroesophageal reflux disease)   . Hyperlipemia   . History of seasonal allergies   . Arthritis   . Angina pectoris ([place]) 08/26/2012  . Complication of anesthesia   . PONV (postoperative nausea and vomiting)   . Hypertension     sees Dr. Fulton Reek  . Coronary artery disease 08/22/2012    sees Dr Rockey Situ  . Pneumonia     hx of  . Headache(784.0)     migraines  . Cancer [facility])     breast cancer, right side  . Gastritis     hx of  . 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 Stroke [facility])     Past Surgical History  Procedure Laterality Date  . Abdominal hysterectomy    . Ovarian cyst removal    . Back surgery    . Appendectomy    . Breast surgery    . Laparoscopic nissen fundoplication    . Intraoperative transesophageal echocardiogram  09/05/2012    Procedure: INTRAOPERATIVE  TRANSESOPHAGEAL ECHOCARDIOGRAM;  Surgeon: Rexene Alberts, MD;  Location: [place];  Service: Open Heart Surgery;  Laterality: N/A;  . Coronary artery bypass graft  09/05/2012    Procedure: CORONARY ARTERY BYPASS GRAFTING (CABG);  Surgeon: Rexene Alberts, MD;  Location: [place];  Service: Open Heart Surgery;  Laterality: N/A;  CABG x four, using left internal mammary artery and bilateral greater saphenous vein harvested endoscopically  . Cardiac catheterization  2014       History of present illness and  Hospital Course:     Kindly see H&P for history of present illness and admission details, please review complete Labs, Consult reports and Test reports for all details in brief  HPI  from the history and physical done on the day of admission    Hospital Course    Pt admitted with chronic CHF with acute pneumonia. Weaned off O2. Improved with ABX and SVN's. BP meds adjusted. Some hypoglycemia,  taken off Amaryl. Renal fxn improved with hydration.   Discharge Condition: stable   Follow UP  Follow-up Information    Follow up In 6 days.      Follow up with Nica Friske D, MD In 6 days.   Specialty:  Internal Medicine   Contact information:   [place] [address] [phone]         Discharge Instructions  and  Discharge Medications        Medication List    STOP taking these medications        furosemide 40 MG tablet  Commonly known as:  LASIX     glimepiride 4 MG tablet  Commonly known as:  AMARYL     potassium chloride SA 20 MEQ tablet  Commonly known as:  K-DUR,KLOR-CON      TAKE these medications        amLODipine 5 MG tablet  Commonly known as:  NORVASC  Take 1 tablet (5 mg total) by mouth daily.     aspirin 325 MG EC tablet  Take 325 mg by mouth daily.     CALCIUM 500 + D PO  Take 1 tablet by mouth daily.     cloNIDine 0.2 MG tablet  Commonly known as:  CATAPRES  Take 1 tablet (0.2 mg total) by mouth  3 (three) times daily.     cyanocobalamin 1000 MCG/ML injection  Commonly known as:  (VITAMIN B-12)  Inject 1,000 mcg into the muscle every 30 (thirty) days.     Fish Oil 1000 MG Caps  Take 1,000 mg by mouth daily.     gabapentin 100 MG capsule  Commonly known as:  NEURONTIN  Take 100 mg by mouth 2 (two) times daily.     hydrALAZINE 50 MG tablet  Commonly known as:  APRESOLINE  Take 1 tablet (50 mg total) by mouth 2 (two) times daily.     HYDROcodone-acetaminophen 5-325 MG tablet  Commonly known as:  NORCO/VICODIN  Take 1-2 tablets by mouth every 4 (four) hours as needed for moderate pain.     ipratropium-albuterol 0.5-2.5 (3) MG/3ML Soln  Commonly known as:  DUONEB  Take 3 mLs by nebulization 4 (four) times daily.     levofloxacin 750 MG tablet  Commonly known as:  LEVAQUIN  Take 1 tablet (750 mg total) by mouth every other day.     levothyroxine 125 MCG tablet  Commonly known as:  SYNTHROID, LEVOTHROID  Take 125 mcg by mouth daily.     metoprolol 50 MG tablet  Commonly known as:  LOPRESSOR  Take 1 tablet (50 mg total) by mouth every 8 (eight) hours.     ondansetron 4 MG tablet  Commonly known as:  ZOFRAN  Take 1 tablet (4 mg total) by mouth every 4 (four) hours as needed for nausea.     zolpidem 5 MG tablet  Commonly known as:  AMBIEN  Take 1 tablet (5 mg total) by mouth at bedtime as needed for sleep.          Diet and Activity recommendation: See Discharge Instructions above   Consults obtained - Cardiology   Major procedures and Radiology Reports - PLEASE review detailed and final reports for all details, in brief -   See below   Dg Chest 2 View  07/19/2015  CLINICAL DATA:  Cough. EXAM: CHEST  2 VIEW COMPARISON:  July 17, 2015. FINDINGS: Stable cardiomegaly. Status post coronary artery bypass graft. No  pneumothorax is noted. Mild bilateral pleural effusions are noted. Linear left midlung opacity is noted concerning for subsegmental atelectasis.  Mild bibasilar opacities are noted concerning for subsegmental atelectasis. Bony thorax is unremarkable. IMPRESSION: Left midlung subsegmental atelectasis and bibasilar subsegmental atelectasis is noted. Mild bilateral pleural effusions are noted posteriorly. Electronically Signed   By: Marijo Conception, M.D.   On: 07/19/2015 07:44   Dg Chest 2 View  07/15/2015  CLINICAL DATA:  Shortness of breath. Wheezing. Nonproductive cough. EXAM: CHEST  2 VIEW COMPARISON:  05/04/2014 FINDINGS: Slight cardiomegaly. Tiny left effusion. Slight interstitial accentuation at the bases with peribronchial thickening. This could represent bronchitis or slight interstitial edema. Pulmonary vascularity is normal. Accentuation of the thoracic kyphosis. CABG. No IMPRESSION: Small left effusion. Interstitial accentuation which could be secondary to edema or bronchitis. Electronically Signed   By: Lorriane Shire M.D.   On: 07/15/2015 10:13   Dg Chest Portable 1 View  07/17/2015  CLINICAL DATA:  Shortness of breath, difficulty breathing, wheezing EXAM: PORTABLE CHEST 1 VIEW COMPARISON:  07/15/2015 FINDINGS: Patchy left lower lobe/ lingular opacity, with additional mild right lower lobe opacity, suspicious for multifocal pneumonia. No pleural effusion or pneumothorax. Cardiomegaly. IMPRESSION: Mild patchy bilateral lower lobe and lingular opacities, suspicious for pneumonia. Electronically Signed   By: Julian Hy M.D.   On: 07/17/2015 07:54    Micro Results   See below  Recent Results (from the past 240 hour(s))  Blood culture (routine x 2)     Status: None (Preliminary result)   Collection Time: 07/17/15  8:06 AM  Result Value Ref Range Status   Specimen Description BLOOD RIGHT ARM  Final   Special Requests BOTTLES DRAWN AEROBIC AND ANAEROBIC  8CC  Final   Culture NO GROWTH 2 DAYS  Final   Report Status PENDING  Incomplete  Blood culture (routine x 2)     Status: None (Preliminary result)   Collection Time:  07/17/15  8:24 AM  Result Value Ref Range Status   Specimen Description BLOOD RIGHT HAND  Final   Special Requests BOTTLES DRAWN AEROBIC AND ANAEROBIC  5CC  Final   Culture NO GROWTH 2 DAYS  Final   Report Status PENDING  Incomplete       Today   Subjective:   Harvest Blevens today has no headache,no chest abdominal pain,no new weakness tingling or numbness, feels much better wants to go home today.   Objective:   Blood pressure 188/95, pulse 95, temperature 97.9 F (36.6 C), temperature source Oral, resp. rate 23, height 6\' 2"  (1.88 m), weight 105.325 kg (232 lb 3.2 oz), SpO2 91 %.   Intake/Output Summary (Last 24 hours) at 07/20/15 0728 Last data filed at 07/20/15 0710  Gross per 24 hour  Intake 1088.33 ml  Output   2575 ml  Net -1486.67 ml    Exam Awake Alert, Oriented x 3, No new F.N deficits, Normal affect [place].AT,PERRAL Supple Neck,No JVD, No cervical lymphadenopathy appriciated.  Symmetrical Chest wall movement, Good air movement bilaterally, scattered rhonchi RRR,No Gallops,Rubs or new Murmurs, No Parasternal Heave +ve B.Sounds, Abd Soft, Non tender, No organomegaly appriciated, No rebound -guarding or rigidity. No Cyanosis, Clubbing or edema, No new Rash or bruise  Data Review   CBC w Diff: Lab Results  Component Value Date   WBC 8.2 07/20/2015   WBC 13.1* 05/07/2014   WBC 6.2 08/19/2012   HGB 10.7* 07/20/2015   HGB 12.7 05/07/2014   HCT 32.0* 07/20/2015   HCT 37.2  05/07/2014   PLT 155 07/20/2015   PLT 224 05/07/2014   LYMPHOPCT 16 07/20/2015   LYMPHOPCT 17.5 05/07/2014   MONOPCT 7 07/20/2015   MONOPCT 8.4 05/07/2014   EOSPCT 4 07/20/2015   EOSPCT 0.2 05/07/2014   BASOPCT 1 07/20/2015   BASOPCT 0.2 05/07/2014    CMP: Lab Results  Component Value Date   NA 140 07/20/2015   NA 142 05/07/2014   NA 136 08/19/2012   K 4.9 07/20/2015   K 3.2* 05/07/2014   CL 106 07/20/2015   CL 104 05/07/2014   CO2 28 07/20/2015   CO2 29 05/07/2014   BUN 29*  07/20/2015   BUN 26* 05/07/2014   BUN 14 08/19/2012   CREATININE 1.46* 07/20/2015   CREATININE 1.13 05/07/2014   PROT 7.9 07/17/2015   PROT 8.0 05/03/2014   ALBUMIN 3.6 07/17/2015   ALBUMIN 2.9* 05/03/2014   BILITOT 0.9 07/17/2015   BILITOT 0.3 05/03/2014   ALKPHOS 89 07/17/2015   ALKPHOS 109 05/03/2014   AST 25 07/17/2015   AST 25 05/03/2014   ALT 26 07/17/2015   ALT 19 05/03/2014  .   Total Time in preparing paper work, data evaluation and todays exam - 104 minutes  Brixon Zhen D M.D on 07/20/2015 at 7:28 AM

## 2015-07-20 NOTE — Clinical Documentation Improvement (Signed)
Internal Medicine  Thank you for answering previous query! Can the diagnosis of CKD be further specified? Please update your documentation within the medical record to reflect your response to this query. Thank you   CKD Stage I - GFR greater than or equal to 90  CKD Stage II - GFR 60-89  CKD Stage III - GFR 30-59  CKD Stage IV - GFR 15-29  CKD Stage V - GFR < 15  ESRD (End Stage Renal Disease)  Other condition  Unable to clinically determine  Supporting Information: : (risk factors, signs and symptoms, diagnostics, treatment)  White female  GFR's ranging from 23 to 63 for current admission  Please exercise your independent, professional judgment when responding. A specific answer is not anticipated or expected.   Thank You, [place] [phone]

## 2015-07-20 NOTE — Care Management Important Message (Signed)
Important Message  Patient Details  Name: [name] MRN: [id number] Date of Birth: 06-24-1934   Medicare Important Message Given:  Yes-second notification given    [name] A Allmond 07/20/2015, 10:06 AM

## 2015-07-20 NOTE — Progress Notes (Signed)
Discharge instructions explained to pt and pts spouse/ verbalized an understanding/ iv and tele removed/ rx given to pt/ transported off unit via wheelchair.

## 2015-07-20 NOTE — Care Management Note (Signed)
Case Management Note  Patient Details  Name: [name] MRN: [id number] Date of Birth: 12/01/1933  Subjective/Objective:                 Patient to be discharged home today.  PT recommending home health PT. Patient and husband offered choice of agency.  Declined services at this time.  Patient has BSC, walker, and shower chair in the home.   Action/Plan:  No further discharge needs.  RNCM signing off   Expected Discharge Date:                  Expected Discharge Plan:     In-House Referral:     Discharge planning Services     Post Acute Care Choice:    Choice offered to:     DME Arranged:    DME Agency:     HH Arranged:    [place] Agency:     Status of Service:     Medicare Important Message Given:    Date Medicare IM Given:    Medicare IM give by:    Date Additional Medicare IM Given:    Additional Medicare Important Message give by:     If discussed at [place] of Stay Meetings, dates discussed:    Additional Comments:  Beverly Sessions, RN 07/20/2015, 9:13 AM

## 2015-07-21 ENCOUNTER — Encounter: Payer: Self-pay | Admitting: Emergency Medicine

## 2015-07-21 ENCOUNTER — Emergency Department
Admission: EM | Admit: 2015-07-21 | Discharge: 2015-07-21 | Disposition: A | Discharge status: Home / Self Care | Payer: Medicare Other | Attending: Emergency Medicine | Admitting: Emergency Medicine

## 2015-07-21 ENCOUNTER — Emergency Department: Payer: Medicare Other

## 2015-07-21 DIAGNOSIS — R0602 Shortness of breath: Secondary | ICD-10-CM | POA: Diagnosis present

## 2015-07-21 DIAGNOSIS — E119 Type 2 diabetes mellitus without complications: Secondary | ICD-10-CM | POA: Diagnosis not present

## 2015-07-21 DIAGNOSIS — Z87891 Personal history of nicotine dependence: Secondary | ICD-10-CM | POA: Insufficient documentation

## 2015-07-21 DIAGNOSIS — Z792 Long term (current) use of antibiotics: Secondary | ICD-10-CM | POA: Diagnosis not present

## 2015-07-21 DIAGNOSIS — Z7982 Long term (current) use of aspirin: Secondary | ICD-10-CM | POA: Diagnosis not present

## 2015-07-21 DIAGNOSIS — R609 Edema, unspecified: Secondary | ICD-10-CM | POA: Insufficient documentation

## 2015-07-21 DIAGNOSIS — I10 Essential (primary) hypertension: Secondary | ICD-10-CM | POA: Insufficient documentation

## 2015-07-21 DIAGNOSIS — R0981 Nasal congestion: Secondary | ICD-10-CM | POA: Diagnosis not present

## 2015-07-21 DIAGNOSIS — Z79899 Other long term (current) drug therapy: Secondary | ICD-10-CM | POA: Diagnosis not present

## 2015-07-21 LAB — BASIC METABOLIC PANEL
Anion gap: 6 (ref 5–15)
BUN: 23 mg/dL — ABNORMAL HIGH (ref 6–20)
CO2: 25 mmol/L (ref 22–32)
Calcium: 8.3 mg/dL — ABNORMAL LOW (ref 8.9–10.3)
Chloride: 106 mmol/L (ref 101–111)
Creatinine, Ser: 1.41 mg/dL — ABNORMAL HIGH (ref 0.44–1.00)
GFR calc Af Amer: 40 mL/min — ABNORMAL LOW (ref >60)
GFR calc non Af Amer: 35 mL/min — ABNORMAL LOW (ref >60)
Glucose, Bld: 130 mg/dL — ABNORMAL HIGH (ref 65–99)
Potassium: 4.6 mmol/L (ref 3.5–5.1)
Sodium: 137 mmol/L (ref 135–145)

## 2015-07-21 LAB — CBC
HCT: 33.9 % — ABNORMAL LOW (ref 35.0–47.0)
Hemoglobin: 11.2 g/dL — ABNORMAL LOW (ref 12.0–16.0)
MCH: 28.3 pg (ref 26.0–34.0)
MCHC: 33.2 g/dL (ref 32.0–36.0)
MCV: 85.3 fL (ref 80.0–100.0)
Platelets: 176 10*3/uL (ref 150–440)
RBC: 3.98 MIL/uL (ref 3.80–5.20)
RDW: 15.5 % — ABNORMAL HIGH (ref 11.5–14.5)
WBC: 7.9 10*3/uL (ref 3.6–11.0)

## 2015-07-21 LAB — BRAIN NATRIURETIC PEPTIDE: B Natriuretic Peptide: 526 pg/mL — ABNORMAL HIGH (ref 0.0–100.0)

## 2015-07-21 LAB — TROPONIN I: Troponin I: <0.03 ng/mL (ref <0.031)

## 2015-07-21 MED ORDER — CLONIDINE HCL 0.1 MG PO TABS
0.2000 mg | ORAL_TABLET | Freq: Once | ORAL | Status: AC
  Administered 2015-07-21: 0.2 mg via ORAL
  Filled 2015-07-21: qty 2

## 2015-07-21 MED ORDER — METOPROLOL TARTRATE 50 MG PO TABS
50.0000 mg | ORAL_TABLET | Freq: Once | ORAL | Status: AC
  Administered 2015-07-21: 50 mg via ORAL
  Filled 2015-07-21: qty 1

## 2015-07-21 NOTE — ED Notes (Signed)
Patient ambulated to toilet in room, O2 sat dropped to 89% on room air while patient ambulating.

## 2015-07-21 NOTE — ED Notes (Signed)
Patient brought in [facility], patient was d/c from hospital yesterday. Patient was diagnosed with pneumonia. Patient states that when she got home yesterday she began to feel bad again, she called EMS this morning because she was having more trouble breathing and felt like there was something in her throat.

## 2015-07-21 NOTE — ED Provider Notes (Addendum)
[facility] Emergency Department Provider Note   ____________________________________________  Time seen: On EMS arrival I have reviewed the triage vital signs and the triage nursing note.  HISTORY  Chief Complaint Shortness of Breath   Historian Patient    HPI [name] is a 81 y.o. female who was recently discharged from hospital yesterday for pneumonia and congestive heart failure exacerbation. During her hospital stay she was treated with Rocephin and azithromycin, and discharged home on Levaquin. She did experience some mild acute renal failure and was stopped off of her Lasix upon discharge home. She's also to stop off of her diabetic medication because of hypoglycemia in the hospital. Patient states she was ready to go home yesterday, however since she's been home she felt increased nasal drainage, making her feel like she was going to choke, as well as feeling generalized weakness all over, as well as return of shortness of breath. She is unsure whether or not she's had a fever. No vomiting. No chest pain or palpitations.    Past Medical History  Diagnosis Date  . Breast mass, right   . Vertigo   . Thyroid disease   . History of breast cancer     39 treatments of radiation. Negative chemo.  . Diabetes ([place])   . GERD (gastroesophageal reflux disease)   . Hyperlipemia   . History of seasonal allergies   . Arthritis   . Angina pectoris ([place]) 08/26/2012  . Complication of anesthesia   . PONV (postoperative nausea and vomiting)   . Hypertension     sees Dr. Fulton Reek  . Coronary artery disease 08/22/2012    sees Dr Rockey Situ  . Pneumonia     hx of  . Headache(784.0)     migraines  . Cancer [facility])     breast cancer, right side  . Gastritis     hx of  . 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 Stroke [facility])     Patient Active Problem List   Diagnosis Date Noted  .  Pneumonia 07/17/2015  . Cerebral embolism with cerebral infarction ([place]) 09/07/2012  . Hemiplegia, unspecified, affecting dominant side 09/07/2012  . 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 Angina pectoris ([place]) 08/26/2012  . Coronary artery disease 08/22/2012  . Throat pain 08/19/2012  . Shortness of breath 08/19/2012  . Hyperlipidemia 08/19/2012  . Diabetes ([place]) 08/19/2012  . Hypertension 08/19/2012  . Breast cancer ([place]) 08/19/2012  . GERD (gastroesophageal reflux disease) 08/19/2012    Past Surgical History  Procedure Laterality Date  . Abdominal hysterectomy    . Ovarian cyst removal    . Back surgery    . Appendectomy    . Breast surgery    . Laparoscopic nissen fundoplication    . Intraoperative transesophageal echocardiogram  09/05/2012    Procedure: INTRAOPERATIVE TRANSESOPHAGEAL ECHOCARDIOGRAM;  Surgeon: Rexene Alberts, MD;  Location: [place];  Service: Open Heart Surgery;  Laterality: N/A;  . Coronary artery bypass graft  09/05/2012    Procedure: CORONARY ARTERY BYPASS GRAFTING (CABG);  Surgeon: Rexene Alberts, MD;  Location: [place];  Service: Open Heart Surgery;  Laterality: N/A;  CABG x four, using left internal mammary artery and bilateral greater saphenous vein harvested endoscopically  . Cardiac catheterization  2014    Current Outpatient Rx  Name  Route  Sig  Dispense  Refill  .  amLODipine (NORVASC) 5 MG tablet   Oral   Take 1 tablet (5 mg total) by mouth daily.   30 tablet   5   . aspirin 325 MG EC tablet   Oral   Take 325 mg by mouth daily.         . Calcium Carbonate-Vitamin D (CALCIUM 500 + D PO)   Oral   Take 1 tablet by mouth daily.         . cloNIDine (CATAPRES) 0.2 MG tablet   Oral   Take 1 tablet (0.2 mg total) by mouth 3 (three) times daily.   90 tablet   11   . cyanocobalamin (,VITAMIN B-12,) 1000 MCG/ML injection   Intramuscular   Inject 1,000 mcg into the muscle every 30 (thirty) days.         Marland Kitchen gabapentin (NEURONTIN) 100 MG capsule    Oral   Take 100 mg by mouth 2 (two) times daily.         . hydrALAZINE (APRESOLINE) 50 MG tablet   Oral   Take 1 tablet (50 mg total) by mouth 2 (two) times daily.   60 tablet   5   . HYDROcodone-acetaminophen (NORCO/VICODIN) 5-325 MG tablet   Oral   Take 1-2 tablets by mouth every 4 (four) hours as needed for moderate pain.   30 tablet   0   . ipratropium-albuterol (DUONEB) 0.5-2.5 (3) MG/3ML SOLN   Nebulization   Take 3 mLs by nebulization 4 (four) times daily.   360 mL   3   . levofloxacin (LEVAQUIN) 750 MG tablet   Oral   Take 1 tablet (750 mg total) by mouth every other day.   7 tablet   0   . levothyroxine (SYNTHROID, LEVOTHROID) 125 MCG tablet   Oral   Take 125 mcg by mouth daily.         . metoprolol (LOPRESSOR) 50 MG tablet   Oral   Take 1 tablet (50 mg total) by mouth every 8 (eight) hours.   90 tablet   5   . Omega-3 Fatty Acids (FISH OIL) 1000 MG CAPS   Oral   Take 1,000 mg by mouth daily.          . ondansetron (ZOFRAN) 4 MG tablet   Oral   Take 1 tablet (4 mg total) by mouth every 4 (four) hours as needed for nausea.   20 tablet   0   . zolpidem (AMBIEN) 5 MG tablet   Oral   Take 1 tablet (5 mg total) by mouth at bedtime as needed for sleep.   30 tablet   0     Allergies Ciprocinonide; Darvocet; Duratuss g; Ivp dye; Ketek ; Levaquin ; Other; and Statins  Family History  Problem Relation Age of Onset  . Heart disease Brother     CABG & stents  . Hyperlipidemia Brother   . Hypertension Brother   . Cancer Father   . Heart disease Mother     Social History Social History  Substance Use Topics  . Smoking status: Former Smoker    Types: Cigarettes    Quit date: 11/13/1988  . Smokeless tobacco: None  . Alcohol Use: No    Review of Systems  Constitutional: Negative for fever. Eyes: Negative for visual changes. ENT: Positive for sore throat. Cardiovascular: Negative for chest pain. Respiratory: Positive for shortness of  breath. Gastrointestinal: Negative for abdominal pain, vomiting and diarrhea. Genitourinary: Negative for dysuria. Musculoskeletal: Negative for  back pain. Skin: Negative for rash. Neurological: Negative for headache. 10 point Review of Systems otherwise negative ____________________________________________   PHYSICAL EXAM:  VITAL SIGNS: ED Triage Vitals  Enc Vitals Group     BP 07/21/15 0741 194/94 mmHg     Pulse Rate 07/21/15 0741 95     Resp 07/21/15 0741 26     Temp 07/21/15 0741 97.9 F (36.6 C)     Temp Source 07/21/15 0741 Oral     SpO2 07/21/15 0741 96 %     Weight 07/21/15 0741 276 lb 14.4 oz (125.6 kg)     Height 07/21/15 0741 5\' 4"  (1.626 m)     Head Cir --      Peak Flow --      Pain Score 07/21/15 0743 0     Pain Loc --      Pain Edu? --      Excl. in [place]? --      Constitutional: Alert and oriented. Looks like she doesn't feel well, but in no acute distress.. Eyes: Conjunctivae are normal. PERRL. Normal extraocular movements. ENT   Head: Normocephalic and atraumatic.   Nose: No congestion/rhinnorhea.   Mouth/Throat: Mucous membranes are moderately dry.   Neck: No stridor. Cardiovascular/Chest: Normal rate, regular rhythm.  No murmurs, rubs, or gallops. Respiratory: Normal respiratory effort without tachypnea nor retractions. Mild decreased air movement throughout, but without wheezing or rhonchi. Gastrointestinal: Soft. No distention, no guarding, no rebound. Nontender. Obese.  Genitourinary/rectal:Deferred Musculoskeletal: Nontender with normal range of motion in all extremities. No joint effusions.  No lower extremity tenderness.  Trace lower extremity edema bilaterally. Neurologic:  Normal speech and language. No gross or focal neurologic deficits are appreciated. Skin:  Skin is warm, dry and intact. No rash noted. Psychiatric: Mood and affect are normal. Speech and behavior are normal. Patient exhibits appropriate insight and  judgment.  ____________________________________________   EKG I, Lisa Roca, MD, the attending physician have personally viewed and interpreted all ECGs.  96 bpm. Sinus rhythm. Normal axis. Narrow QRS. Normal ST and T-wave. ____________________________________________  LABS (pertinent positives/negatives)  White blood count 7.9, hemoglobin 11.2 and platelet count 0000000 Basic metabolic panel significant for BUN 23 and creatinine 1.41 and otherwise without significant abnormalities BNP 526 Troponin less than 0.03  ____________________________________________  RADIOLOGY All Xrays were viewed by me. Imaging interpreted by Radiologist.  Chest x-ray portable:   FINDINGS: There is bilateral mild interstitial thickening. There bilateral trace pleural effusions. There is no pneumothorax. There is no focal consolidation. There is stable cardiomegaly. There is evidence of prior CABG.  The osseous structures are unremarkable.  IMPRESSION: Findings most consistent with mild CHF. __________________________________________  PROCEDURES  Procedure(s) performed:   I placed a peripheral IV, right external jugular vein.  20-gauge.  Critical Care performed: None  ____________________________________________   ED COURSE / ASSESSMENT AND PLAN  CONSULTATIONS: None  Pertinent labs & imaging results that were available during my care of the patient were reviewed by me and considered in my medical decision making (see chart for details).   This patient is here for reevaluation after discharge from the hospital yesterday, her vital signs are reassuring here in the emergency department. It sounds like her main complaint is that she is having sinus drainage causing her to have congestion and drainage down the back of her throat. She is also complaining that she's just had generalized weakness since being home. She's complaining of mild shortness of breath which is persistent since she's  been in  the hospital. She was treated for congestive heart failure exacerbation as well as 20 acquired pneumonia. It appears that she was sent home on Levaquin 750 mg every other day due to low creatinine clearance  Patient's exam and evaluation here are reassuring for no new acute or worsening problem. I did ask her to go ahead and try Zyrtec or Claritin for sinus congestion. She is not currently hypoxic. She has no hypotension. She's not had a fever. She can talk and speak well. She's not any pleuritic chest pain are not suspicious of a PE.  Patient on reexam around 11 AM was feeling well and was very excited that I did not recommend rehospitalization. She has appointment already scheduled for her primary care physician Dr. Doy Hutching for next week.  Patient / Family / Caregiver informed of clinical course, medical decision-making process, and agree with plan.   I discussed return precautions, follow-up instructions, and discharged instructions with patient and/or family.  ___________________________________________   FINAL CLINICAL IMPRESSION(S) / ED DIAGNOSES   Final diagnoses:  Shortness of breath  Congestion of nasal sinus       Lisa Roca, MD 07/21/15 1116  Lisa Roca, MD 07/21/15 1135

## 2015-07-21 NOTE — ED Notes (Signed)
Patient ambulated around nurses station with O2 sats being monitored, during ambulation patients sats stayed above 92% on room air.

## 2015-07-21 NOTE — Discharge Instructions (Signed)
You were evaluated for sinus congestion and nasal drainage along with persistent generalized weakness and shortness of breath after diagnosis and treatment of pneumonia and congestive heart failure in the hospital. Exam and evaluation are reassuring this morning for no additional complication that would require rehospitalization.  As we discussed, tried taking over-the-counter Claritin or Zyrtec as needed for nasal congestion and sinus drainage.  Return to the emergency department for any fever, new or worsening condition including trouble breathing, shortness breath, chest pain, or any other symptoms concerning to you.  Follow-up with Dr. Doy Hutching as scheduled early next week.  Community-Acquired Pneumonia, Adult Pneumonia is an infection of the lungs. There are different types of pneumonia. One type can develop while a person is in a hospital. A different type, called community-acquired pneumonia, develops in people who are not, or have not recently been, in the hospital or other health care facility.  CAUSES Pneumonia may be caused by bacteria, viruses, or funguses. Community-acquired pneumonia is often caused by Streptococcus pneumonia bacteria. These bacteria are often passed from one person to another by breathing in droplets from the cough or sneeze of an infected person. RISK FACTORS The condition is more likely to develop in:  People who havechronic diseases, such as chronic obstructive pulmonary disease (COPD), asthma, congestive heart failure, cystic fibrosis, diabetes, or kidney disease.  People who haveearly-stage or late-stage HIV.  People who havesickle cell disease.  People who havehad their spleen removed (splenectomy).  People who havepoor Human resources officer.  People who havemedical conditions that increase the risk of breathing in (aspirating) secretions their own mouth and nose.   People who havea weakened immune system (immunocompromised).  People who  smoke.  People whotravel to areas where pneumonia-causing germs commonly exist.  People whoare around animal habitats or animals that have pneumonia-causing germs, including birds, bats, rabbits, cats, and farm animals. SYMPTOMS Symptoms of this condition include:  Adry cough.  A wet (productive) cough.  Fever.  Sweating.  Chest pain, especially when breathing deeply or coughing.  Rapid breathing or difficulty breathing.  Shortness of breath.  Shaking chills.  Fatigue.  Muscle aches. DIAGNOSIS Your health care provider will take a medical history and perform a physical exam. You may also have other tests, including:  Imaging studies of your chest, including X-rays.  Tests to check your blood oxygen level and other blood gases.  Other tests on blood, mucus (sputum), fluid around your lungs (pleural fluid), and urine. If your pneumonia is severe, other tests may be done to identify the specific cause of your illness. TREATMENT The type of treatment that you receive depends on many factors, such as the cause of your pneumonia, the medicines you take, and other medical conditions that you have. For most adults, treatment and recovery from pneumonia may occur at home. In some cases, treatment must happen in a hospital. Treatment may include:  Antibiotic medicines, if the pneumonia was caused by bacteria.  Antiviral medicines, if the pneumonia was caused by a virus.  Medicines that are given by mouth or through an IV tube.  Oxygen.  Respiratory therapy. Although rare, treating severe pneumonia may include:  Mechanical ventilation. This is done if you are not breathing well on your own and you cannot maintain a safe blood oxygen level.  Thoracentesis. This procedureremoves fluid around one lung or both lungs to help you breathe better. HOME CARE INSTRUCTIONS  Take over-the-counter and prescription medicines only as told by your health care provider.  Only  takecough medicine  if you are losing sleep. Understand that cough medicine can prevent your body's natural ability to remove mucus from your lungs.  If you were prescribed an antibiotic medicine, take it as told by your health care provider. Do not stop taking the antibiotic even if you start to feel better.  Sleep in a semi-upright position at night. Try sleeping in a reclining chair, or place a few pillows under your head.  Do not use tobacco products, including cigarettes, chewing tobacco, and e-cigarettes. If you need help quitting, ask your health care provider.  Drink enough water to keep your urine clear or pale yellow. This will help to thin out mucus secretions in your lungs. PREVENTION There are ways that you can decrease your risk of developing community-acquired pneumonia. Consider getting a pneumococcal vaccine if:  You are older than 79 years of age.  You are older than 80 years of age and are undergoing cancer treatment, have chronic lung disease, or have other medical conditions that affect your immune system. Ask your health care provider if this applies to you. There are different types and schedules of pneumococcal vaccines. Ask your health care provider which vaccination option is best for you. You may also prevent community-acquired pneumonia if you take these actions:  Get an influenza vaccine every year. Ask your health care provider which type of influenza vaccine is best for you.  Go to the dentist on a regular basis.  Wash your hands often. Use hand sanitizer if soap and water are not available. SEEK MEDICAL CARE IF:  You have a fever.  You are losing sleep because you cannot control your cough with cough medicine. SEEK IMMEDIATE MEDICAL CARE IF:  You have worsening shortness of breath.  You have increased chest pain.  Your sickness becomes worse, especially if you are an older adult or have a weakened immune system.  You cough up blood.   This  information is not intended to replace advice given to you by your health care provider. Make sure you discuss any questions you have with your health care provider.   Document Released: 09/18/2005 Document Revised: 06/09/2015 Document Reviewed: 01/13/2015 Elsevier Interactive Patient Education 2016 [employer] Heart Failure Heart failure is a condition in which the heart has trouble pumping blood. This means your heart does not pump blood efficiently for your body to work well. In some cases of heart failure, fluid may back up into your lungs or you may have swelling (edema) in your lower legs. Heart failure is usually a long-term (chronic) condition. It is important for you to take good care of yourself and follow your health care provider's treatment plan. CAUSES  Some health conditions can cause heart failure. Those health conditions include:  High blood pressure (hypertension). Hypertension causes the heart muscle to work harder than normal. When pressure in the blood vessels is high, the heart needs to pump (contract) with more force in order to circulate blood throughout the body. High blood pressure eventually causes the heart to become stiff and weak.  Coronary artery disease (CAD). CAD is the buildup of cholesterol and fat (plaque) in the arteries of the heart. The blockage in the arteries deprives the heart muscle of oxygen and blood. This can cause chest pain and may lead to a heart attack. High blood pressure can also contribute to CAD.  Heart attack (myocardial infarction). A heart attack occurs when one or more arteries in the heart become blocked. The loss of oxygen damages the muscle  tissue of the heart. When this happens, part of the heart muscle dies. The injured tissue does not contract as well and weakens the heart's ability to pump blood.  Abnormal heart valves. When the heart valves do not open and close properly, it can cause heart failure. This makes the heart muscle pump  harder to keep the blood flowing.  Heart muscle disease (cardiomyopathy or myocarditis). Heart muscle disease is damage to the heart muscle from a variety of causes. These can include drug or alcohol abuse, infections, or unknown reasons. These can increase the risk of heart failure.  Lung disease. Lung disease makes the heart work harder because the lungs do not work properly. This can cause a strain on the heart, leading it to fail.  Diabetes. Diabetes increases the risk of heart failure. High blood sugar contributes to high fat (lipid) levels in the blood. Diabetes can also cause slow damage to tiny blood vessels that carry important nutrients to the heart muscle. When the heart does not get enough oxygen and food, it can cause the heart to become weak and stiff. This leads to a heart that does not contract efficiently.  Other conditions can contribute to heart failure. These include abnormal heart rhythms, thyroid problems, and low blood counts (anemia). Certain unhealthy behaviors can increase the risk of heart failure, including:  Being overweight.  Smoking or chewing tobacco.  Eating foods high in fat and cholesterol.  Abusing illicit drugs or alcohol.  Lacking physical activity. SYMPTOMS  Heart failure symptoms may vary and can be hard to detect. Symptoms may include:  Shortness of breath with activity, such as climbing stairs.  Persistent cough.  Swelling of the feet, ankles, legs, or abdomen.  Unexplained weight gain.  Difficulty breathing when lying flat (orthopnea).  Waking from sleep because of the need to sit up and get more air.  Rapid heartbeat.  Fatigue and loss of energy.  Feeling light-headed, dizzy, or close to fainting.  Loss of appetite.  Nausea.  Increased urination during the night (nocturia). DIAGNOSIS  A diagnosis of heart failure is based on your history, symptoms, physical examination, and diagnostic tests. Diagnostic tests for heart failure  may include:  Echocardiography.  Electrocardiography.  Chest X-ray.  Blood tests.  Exercise stress test.  Cardiac angiography.  Radionuclide scans. TREATMENT  Treatment is aimed at managing the symptoms of heart failure. Medicines, behavioral changes, or surgical intervention may be necessary to treat heart failure.  Medicines to help treat heart failure may include:  Angiotensin-converting enzyme (ACE) inhibitors. This type of medicine blocks the effects of a blood protein called angiotensin-converting enzyme. ACE inhibitors relax (dilate) the blood vessels and help lower blood pressure.  Angiotensin receptor blockers (ARBs). This type of medicine blocks the actions of a blood protein called angiotensin. Angiotensin receptor blockers dilate the blood vessels and help lower blood pressure.  Water pills (diuretics). Diuretics cause the kidneys to remove salt and water from the blood. The extra fluid is removed through urination. This loss of extra fluid lowers the volume of blood the heart pumps.  Beta blockers. These prevent the heart from beating too fast and improve heart muscle strength.  Digitalis. This increases the force of the heartbeat.  Healthy behavior changes include:  Obtaining and maintaining a healthy weight.  Stopping smoking or chewing tobacco.  Eating heart-healthy foods.  Limiting or avoiding alcohol.  Stopping illicit drug use.  Physical activity as directed by your health care provider.  Surgical treatment for heart failure  may include:  A procedure to open blocked arteries, repair damaged heart valves, or remove damaged heart muscle tissue.  A pacemaker to improve heart muscle function and control certain abnormal heart rhythms.  An internal cardioverter defibrillator to treat certain serious abnormal heart rhythms.  A left ventricular assist device (LVAD) to assist the pumping ability of the heart. HOME CARE INSTRUCTIONS   Take medicines  only as directed by your health care provider. Medicines are important in reducing the workload of your heart, slowing the progression of heart failure, and improving your symptoms.  Do not stop taking your medicine unless directed by your health care provider.  Do not skip any dose of medicine.  Refill your prescriptions before you run out of medicine. Your medicines are needed every day.  Engage in moderate physical activity if directed by your health care provider. Moderate physical activity can benefit some people. The elderly and people with severe heart failure should consult with a health care provider for physical activity recommendations.  Eat heart-healthy foods. Food choices should be free of trans fat and low in saturated fat, cholesterol, and salt (sodium). Healthy choices include fresh or frozen fruits and vegetables, fish, lean meats, legumes, fat-free or low-fat dairy products, and whole grain or high fiber foods. Talk to a dietitian to learn more about heart-healthy foods.  Limit sodium if directed by your health care provider. Sodium restriction may reduce symptoms of heart failure in some people. Talk to a dietitian to learn more about heart-healthy seasonings.  Use healthy cooking methods. Healthy cooking methods include roasting, grilling, broiling, baking, poaching, steaming, or stir-frying. Talk to a dietitian to learn more about healthy cooking methods.  Limit fluids if directed by your health care provider. Fluid restriction may reduce symptoms of heart failure in some people.  Weigh yourself every day. Daily weights are important in the early recognition of excess fluid. You should weigh yourself every morning after you urinate and before you eat breakfast. Wear the same amount of clothing each time you weigh yourself. Record your daily weight. Provide your health care provider with your weight record.  Monitor and record your blood pressure if directed by your health  care provider.  Check your pulse if directed by your health care provider.  Lose weight if directed by your health care provider. Weight loss may reduce symptoms of heart failure in some people.  Stop smoking or chewing tobacco. Nicotine makes your heart work harder by causing your blood vessels to constrict. Do not use nicotine gum or patches before talking to your health care provider.  Keep all follow-up visits as directed by your health care provider. This is important.  Limit alcohol intake to no more than 1 drink per day for nonpregnant women and 2 drinks per day for men. One drink equals 12 ounces of beer, 5 ounces of wine, or 1 ounces of hard liquor. Drinking more than that is harmful to your heart. Tell your health care provider if you drink alcohol several times a week. Talk with your health care provider about whether alcohol is safe for you. If your heart has already been damaged by alcohol or you have severe heart failure, drinking alcohol should be stopped completely.  Stop illicit drug use.  Stay up-to-date with immunizations. It is especially important to prevent respiratory infections through current pneumococcal and influenza immunizations.  Manage other health conditions such as hypertension, diabetes, thyroid disease, or abnormal heart rhythms as directed by your health care provider.  Learn to manage stress.  Plan rest periods when fatigued.  Learn strategies to manage high temperatures. If the weather is extremely hot:  Avoid vigorous physical activity.  Use air conditioning or fans or seek a cooler location.  Avoid caffeine and alcohol.  Wear loose-fitting, lightweight, and light-colored clothing.  Learn strategies to manage cold temperatures. If the weather is extremely cold:  Avoid vigorous physical activity.  Layer clothes.  Wear mittens or gloves, a hat, and a scarf when going outside.  Avoid alcohol.  Obtain ongoing education and support as  needed.  Participate in or seek rehabilitation as needed to maintain or improve independence and quality of life. SEEK MEDICAL CARE IF:   You have a rapid weight gain.  You have increasing shortness of breath that is unusual for you.  You are unable to participate in your usual physical activities.  You tire easily.  You cough more than normal, especially with physical activity.  You have any or more swelling in areas such as your hands, feet, ankles, or abdomen.  You are unable to sleep because it is hard to breathe.  You feel like your heart is beating fast (palpitations).  You become dizzy or light-headed upon standing up. SEEK IMMEDIATE MEDICAL CARE IF:   You have difficulty breathing.  There is a change in mental status such as decreased alertness or difficulty with concentration.  You have a pain or discomfort in your chest.  You have an episode of fainting (syncope). MAKE SURE YOU:   Understand these instructions.  Will watch your condition.  Will get help right away if you are not doing well or get worse.   This information is not intended to replace advice given to you by your health care provider. Make sure you discuss any questions you have with your health care provider.   Document Released: 09/18/2005 Document Revised: 02/02/2015 Document Reviewed: 10/18/2012 Elsevier Interactive Patient Education [employer].

## 2015-07-21 NOTE — Care Management Note (Signed)
Case Management Note  Patient Details  Name: [name] MRN: [id number] Date of Birth: 02/26/1934  Subjective/Objective:      Spoke to the pt. And her husband who is at bedside.Asked the pt. If she would like home P.T. The patient states she has no desire for physical therapy.  She states she has had enough of their services already.               Action/Plan:   Expected Discharge Date:                  Expected Discharge Plan:     In-House Referral:     Discharge planning Services     Post Acute Care Choice:    Choice offered to:     DME Arranged:    DME Agency:     HH Arranged:    [place] Agency:     Status of Service:     Medicare Important Message Given:    Date Medicare IM Given:    Medicare IM give by:    Date Additional Medicare IM Given:    Additional Medicare Important Message give by:     If discussed at [place] of Stay Meetings, dates discussed:    Additional Comments:  Beau Fanny, RN 07/21/2015, 10:27 AM

## 2015-07-22 LAB — CULTURE, BLOOD (ROUTINE X 2)
Culture: NO GROWTH
Culture: NO GROWTH

## 2015-07-23 ENCOUNTER — Other Ambulatory Visit: Payer: Medicare Other

## 2015-07-23 ENCOUNTER — Ambulatory Visit: Payer: Medicare Other

## 2015-07-24 ENCOUNTER — Encounter: Payer: Self-pay | Admitting: Emergency Medicine

## 2015-07-24 ENCOUNTER — Emergency Department: Payer: Medicare Other

## 2015-07-24 ENCOUNTER — Emergency Department
Admission: EM | Admit: 2015-07-24 | Discharge: 2015-07-24 | Disposition: A | Discharge status: Home / Self Care | Payer: Medicare Other | Attending: Emergency Medicine | Admitting: Emergency Medicine

## 2015-07-24 DIAGNOSIS — Z87891 Personal history of nicotine dependence: Secondary | ICD-10-CM | POA: Insufficient documentation

## 2015-07-24 DIAGNOSIS — R471 Dysarthria and anarthria: Secondary | ICD-10-CM

## 2015-07-24 DIAGNOSIS — Z79899 Other long term (current) drug therapy: Secondary | ICD-10-CM | POA: Insufficient documentation

## 2015-07-24 DIAGNOSIS — E119 Type 2 diabetes mellitus without complications: Secondary | ICD-10-CM | POA: Insufficient documentation

## 2015-07-24 DIAGNOSIS — I10 Essential (primary) hypertension: Secondary | ICD-10-CM | POA: Insufficient documentation

## 2015-07-24 DIAGNOSIS — Z7982 Long term (current) use of aspirin: Secondary | ICD-10-CM | POA: Insufficient documentation

## 2015-07-24 DIAGNOSIS — R51 Headache: Secondary | ICD-10-CM | POA: Diagnosis present

## 2015-07-24 LAB — DIFFERENTIAL
Basophils Absolute: 0.1 10*3/uL (ref 0–0.1)
Basophils Relative: 1 %
Eosinophils Absolute: 0.4 10*3/uL (ref 0–0.7)
Eosinophils Relative: 4 %
Lymphocytes Relative: 16 %
Lymphs Abs: 1.7 10*3/uL (ref 1.0–3.6)
Monocytes Absolute: 0.6 10*3/uL (ref 0.2–0.9)
Monocytes Relative: 6 %
Neutro Abs: 7.9 10*3/uL — ABNORMAL HIGH (ref 1.4–6.5)
Neutrophils Relative %: 73 %

## 2015-07-24 LAB — COMPREHENSIVE METABOLIC PANEL
ALT: 17 U/L (ref 14–54)
AST: 19 U/L (ref 15–41)
Albumin: 3.6 g/dL (ref 3.5–5.0)
Alkaline Phosphatase: 84 U/L (ref 38–126)
Anion gap: 8 (ref 5–15)
BUN: 24 mg/dL — ABNORMAL HIGH (ref 6–20)
CO2: 27 mmol/L (ref 22–32)
Calcium: 8.9 mg/dL (ref 8.9–10.3)
Chloride: 101 mmol/L (ref 101–111)
Creatinine, Ser: 1.53 mg/dL — ABNORMAL HIGH (ref 0.44–1.00)
GFR calc Af Amer: 36 mL/min — ABNORMAL LOW (ref >60)
GFR calc non Af Amer: 31 mL/min — ABNORMAL LOW (ref >60)
Glucose, Bld: 114 mg/dL — ABNORMAL HIGH (ref 65–99)
Potassium: 4.3 mmol/L (ref 3.5–5.1)
Sodium: 136 mmol/L (ref 135–145)
Total Bilirubin: 0.1 mg/dL — ABNORMAL LOW (ref 0.3–1.2)
Total Protein: 8.4 g/dL — ABNORMAL HIGH (ref 6.5–8.1)

## 2015-07-24 LAB — URINALYSIS COMPLETE WITH MICROSCOPIC (ARMC ONLY)
Bacteria, UA: NONE SEEN
Bilirubin Urine: NEGATIVE
Glucose, UA: NEGATIVE mg/dL
Ketones, ur: NEGATIVE mg/dL
Nitrite: NEGATIVE
Protein, ur: 100 mg/dL — AB
Specific Gravity, Urine: 1.013 (ref 1.005–1.030)
pH: 7 (ref 5.0–8.0)

## 2015-07-24 LAB — TROPONIN I: Troponin I: <0.03 ng/mL (ref <0.031)

## 2015-07-24 LAB — CBC
HCT: 37.1 % (ref 35.0–47.0)
Hemoglobin: 12.6 g/dL (ref 12.0–16.0)
MCH: 28.9 pg (ref 26.0–34.0)
MCHC: 33.9 g/dL (ref 32.0–36.0)
MCV: 85.4 fL (ref 80.0–100.0)
Platelets: 281 10*3/uL (ref 150–440)
RBC: 4.35 MIL/uL (ref 3.80–5.20)
RDW: 15.5 % — ABNORMAL HIGH (ref 11.5–14.5)
WBC: 10.8 10*3/uL (ref 3.6–11.0)

## 2015-07-24 LAB — PROTIME-INR
INR: 1.02
Prothrombin Time: 13.6 seconds (ref 11.4–15.0)

## 2015-07-24 LAB — APTT: aPTT: 30 seconds (ref 24–36)

## 2015-07-24 MED ORDER — METOCLOPRAMIDE HCL 10 MG PO TABS
10.0000 mg | ORAL_TABLET | Freq: Once | ORAL | Status: AC
  Administered 2015-07-24: 10 mg via ORAL
  Filled 2015-07-24 (×2): qty 1

## 2015-07-24 MED ORDER — ACETAMINOPHEN 325 MG PO TABS
650.0000 mg | ORAL_TABLET | Freq: Once | ORAL | Status: AC
  Administered 2015-07-24: 650 mg via ORAL
  Filled 2015-07-24: qty 2

## 2015-07-24 NOTE — ED Provider Notes (Signed)
[facility] Emergency Department Provider Note  ____________________________________________  Time seen: 4:00 PM  I have reviewed the triage vital signs and the nursing notes.   HISTORY  Chief Complaint Headache    HPI [name] is a 81 y.o. female who complains of difficulty speaking for 2 days.The patient was recently hospitalized for CHF exacerbation and pneumonia, and then reevaluated again 3 days ago in the emergency department and sent back home. 2 days ago as she was getting on the shower she noticed that she could not speak clearly and had mumbling speech. She's also had severe left-sided headache since then which is waxing and waning. No syncope vision changes or numbness tingling or weakness in the body anywhere. She has a history of CVA that resulted in extensive right sided deficits and 2013 which has mostly resolved. She is able to walk on her own currently but with a slow gait. She reports no changes in her balance coordination or muscle strength.  No trauma.   Past Medical History  Diagnosis Date  . Breast mass, right   . Vertigo   . Thyroid disease   . History of breast cancer     39 treatments of radiation. Negative chemo.  . Diabetes ([place])   . GERD (gastroesophageal reflux disease)   . Hyperlipemia   . History of seasonal allergies   . Arthritis   . Angina pectoris ([place]) 08/26/2012  . Complication of anesthesia   . PONV (postoperative nausea and vomiting)   . Hypertension     sees Dr. Fulton Reek  . Coronary artery disease 08/22/2012    sees Dr Rockey Situ  . Pneumonia     hx of  . Headache(784.0)     migraines  . Cancer [facility])     breast cancer, right side  . Gastritis     hx of  . 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 Stroke [facility])      Patient Active Problem List   Diagnosis Date Noted  . Pneumonia 07/17/2015  . Cerebral embolism with cerebral  infarction ([place]) 09/07/2012  . Hemiplegia, unspecified, affecting dominant side 09/07/2012  . 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 Angina pectoris ([place]) 08/26/2012  . Coronary artery disease 08/22/2012  . Throat pain 08/19/2012  . Shortness of breath 08/19/2012  . Hyperlipidemia 08/19/2012  . Diabetes ([place]) 08/19/2012  . Hypertension 08/19/2012  . Breast cancer ([place]) 08/19/2012  . GERD (gastroesophageal reflux disease) 08/19/2012     Past Surgical History  Procedure Laterality Date  . Abdominal hysterectomy    . Ovarian cyst removal    . Back surgery    . Appendectomy    . Breast surgery    . Laparoscopic nissen fundoplication    . Intraoperative transesophageal echocardiogram  09/05/2012    Procedure: INTRAOPERATIVE TRANSESOPHAGEAL ECHOCARDIOGRAM;  Surgeon: Rexene Alberts, MD;  Location: [place];  Service: Open Heart Surgery;  Laterality: N/A;  . Coronary artery bypass graft  09/05/2012    Procedure: CORONARY ARTERY BYPASS GRAFTING (CABG);  Surgeon: Rexene Alberts, MD;  Location: [place];  Service: Open Heart Surgery;  Laterality: N/A;  CABG x four, using left internal mammary artery and bilateral greater saphenous vein harvested endoscopically  . Cardiac catheterization  2014     Current Outpatient Rx  Name  Route  Sig  Dispense  Refill  . amLODipine (NORVASC)  5 MG tablet   Oral   Take 1 tablet (5 mg total) by mouth daily.   30 tablet   5   . aspirin 325 MG EC tablet   Oral   Take 325 mg by mouth daily.         . Calcium Carbonate-Vitamin D (CALCIUM 500 + D PO)   Oral   Take 1 tablet by mouth daily.         . cloNIDine (CATAPRES) 0.2 MG tablet   Oral   Take 1 tablet (0.2 mg total) by mouth 3 (three) times daily.   90 tablet   11   . cyanocobalamin (,VITAMIN B-12,) 1000 MCG/ML injection   Intramuscular   Inject 1,000 mcg into the muscle every 30 (thirty) days.         Marland Kitchen gabapentin (NEURONTIN) 100 MG capsule   Oral   Take 100 mg by mouth 2 (two) times daily.          . hydrALAZINE (APRESOLINE) 50 MG tablet   Oral   Take 1 tablet (50 mg total) by mouth 2 (two) times daily.   60 tablet   5   . HYDROcodone-acetaminophen (NORCO/VICODIN) 5-325 MG tablet   Oral   Take 1-2 tablets by mouth every 4 (four) hours as needed for moderate pain.   30 tablet   0   . ipratropium-albuterol (DUONEB) 0.5-2.5 (3) MG/3ML SOLN   Nebulization   Take 3 mLs by nebulization 4 (four) times daily.   360 mL   3   . levofloxacin (LEVAQUIN) 750 MG tablet   Oral   Take 1 tablet (750 mg total) by mouth every other day.   7 tablet   0   . levothyroxine (SYNTHROID, LEVOTHROID) 125 MCG tablet   Oral   Take 125 mcg by mouth daily.         . metoprolol (LOPRESSOR) 50 MG tablet   Oral   Take 1 tablet (50 mg total) by mouth every 8 (eight) hours.   90 tablet   5   . Omega-3 Fatty Acids (FISH OIL) 1000 MG CAPS   Oral   Take 1,000 mg by mouth daily.          . ondansetron (ZOFRAN) 4 MG tablet   Oral   Take 1 tablet (4 mg total) by mouth every 4 (four) hours as needed for nausea.   20 tablet   0   . zolpidem (AMBIEN) 5 MG tablet   Oral   Take 1 tablet (5 mg total) by mouth at bedtime as needed for sleep.   30 tablet   0      Allergies Ciprocinonide; Darvocet; Duratuss g; Ivp dye; Ketek ; Levaquin ; Other; and Statins   Family History  Problem Relation Age of Onset  . Heart disease Brother     CABG & stents  . Hyperlipidemia Brother   . Hypertension Brother   . Cancer Father   . Heart disease Mother     Social History Social History  Substance Use Topics  . Smoking status: Former Smoker    Types: Cigarettes    Quit date: 11/13/1988  . Smokeless tobacco: None  . Alcohol Use: No    Review of Systems  Constitutional:   No fever or chills. No weight changes Eyes:   No blurry vision or double vision.  ENT:   No sore throat. Cardiovascular:   No chest pain. Respiratory:   No dyspnea or cough. Gastrointestinal:  Negative for abdominal  pain, vomiting and diarrhea.  No BRBPR or melena. Genitourinary:   Negative for dysuria, urinary retention, bloody urine, or difficulty urinating. Musculoskeletal:   Negative for back pain. No joint swelling or pain. Skin:   Negative for rash. Neurological:   Positive as above for headaches, without focal weakness or numbness. Positive dysarthria. Psychiatric:  No anxiety or depression.   Endocrine:  No hot/cold intolerance, changes in energy, or sleep difficulty.  10-point ROS otherwise negative.  ____________________________________________   PHYSICAL EXAM:  VITAL SIGNS: ED Triage Vitals  Enc Vitals Group     BP 07/24/15 1601 159/82 mmHg     Pulse Rate 07/24/15 1601 81     Resp 07/24/15 1601 18     Temp --      Temp src --      SpO2 07/24/15 1601 98 %     Weight 07/24/15 1601 229 lb 11.5 oz (104.2 kg)     Height 07/24/15 1601 5\' 4"  (1.626 m)     Head Cir --      Peak Flow --      Pain Score 07/24/15 1559 10     Pain Loc --      Pain Edu? --      Excl. in [place]? --      Constitutional:   Alert and oriented. Well appearing and in no distress. Eyes:   No scleral icterus. No conjunctival pallor. PERRL. EOMI ENT   Head:   Normocephalic and atraumatic.   Nose:   No congestion/rhinnorhea. No septal hematoma   Mouth/Throat:   Dry mucous membranes, no pharyngeal erythema. No peritonsillar mass. No uvula shift.   Neck:   No stridor. No SubQ emphysema. No meningismus. Hematological/Lymphatic/Immunilogical:   No cervical lymphadenopathy. Cardiovascular:   RRR. Normal and symmetric distal pulses are present in all extremities. No murmurs, rubs, or gallops. Respiratory:   Normal respiratory effort without tachypnea nor retractions. Breath sounds are clear and equal bilaterally. No wheezes/rales/rhonchi. Gastrointestinal:   Soft and nontender. No distention. There is no CVA tenderness.  No rebound, rigidity, or guarding. Genitourinary:   deferred Musculoskeletal:    Nontender with normal range of motion in all extremities. No joint effusions.  No lower extremity tenderness.  No edema. Neurologic:   Slightly slurred speech, normal language.  CN 2-10 normal. Motor grossly intact. No pronator drift.   No gross focal neurologic deficits are appreciated.  NIH stroke scale of 1 due to dysarthria Skin:    Skin is warm, dry and intact. No rash noted.  No petechiae, purpura, or bullae. Psychiatric:   Mood and affect are normal. Speech and behavior are normal. Patient exhibits appropriate insight and judgment.  ____________________________________________    LABS (pertinent positives/negatives) (all labs ordered are listed, but only abnormal results are displayed) Labs Reviewed  CBC - Abnormal; Notable for the following:    RDW 15.5 (*)    All other components within normal limits  DIFFERENTIAL - Abnormal; Notable for the following:    Neutro Abs 7.9 (*)    All other components within normal limits  COMPREHENSIVE METABOLIC PANEL - Abnormal; Notable for the following:    Glucose, Bld 114 (*)    BUN 24 (*)    Creatinine, Ser 1.53 (*)    Total Protein 8.4 (*)    Total Bilirubin 0.1 (*)    GFR calc non Af Amer 31 (*)    GFR calc Af Amer 36 (*)    All other components  within normal limits  URINALYSIS COMPLETEWITH MICROSCOPIC ([facility] ONLY) - Abnormal; Notable for the following:    Color, Urine YELLOW (*)    APPearance CLEAR (*)    Hgb urine dipstick 2+ (*)    Protein, ur 100 (*)    Leukocytes, UA TRACE (*)    Squamous Epithelial / LPF 0-5 (*)    All other components within normal limits  URINE CULTURE  PROTIME-INR  APTT  TROPONIN I   ____________________________________________   EKG  Interpreted by me Normal sinus rhythm rate of 82, left axis, normal intervals, poor R-wave progression in anterior precordial leads, normal ST segments and T waves.  ____________________________________________    RADIOLOGY  CT head  unremarkable  ____________________________________________   PROCEDURES   ____________________________________________   INITIAL IMPRESSION / ASSESSMENT AND PLAN / ED COURSE  Pertinent labs & imaging results that were available during my care of the patient were reviewed by me and considered in my medical decision making (see chart for details).  Patient presents with dysarthria and headache for 2 days. Takes aspirin 325 daily but no Plavix. We'll get CT head labs and urinalysis and reassess. She does feel like her symptoms are improving, as she may be suitable for follow-up with her primary care doctor, Dr. Doy Hutching if workup is essentially unremarkable.  ----------------------------------------- 5:50 PM on 07/24/2015 -----------------------------------------  On reassessment the patient feels that her dysarthria has completely resolved and she is back to her usual self except for a mild left-sided headache. She feels that she is well enough to go home and thinks that this may be related to her chronic migraines. Advised to continue all her medications and keep her appointment with Dr. Doy Hutching in 3 days. I did discuss the case with Dr. Doy Hutching who agrees that outpatient management and follow-up is appropriate. Patient counseled on hematuria, urine culture sent, will hold off on antibiotics for now since Dr. Doy Hutching will be able to follow-up on the culture and see the patient soon. Patient is well-appearing, nontoxic, no acute distress. Clear cognition, good spirits   ____________________________________________   FINAL CLINICAL IMPRESSION(S) / ED DIAGNOSES  Final diagnoses:  Dysarthria      Carrie Mew, MD 07/24/15 1751

## 2015-07-24 NOTE — Discharge Instructions (Signed)
You were seen in the ER today for headache and difficulty speaking. Your blood tests and CT scan of the head were unremarkable. Your urine test shows some blood in the urine. Keep your current appointment with Dr. Doy Hutching in 3 days for continued monitoring of your symptoms and follow-up of the urine findings. We did send a urine culture today. If you have any new symptoms, call your doctor's office for an urgent appointment. If you are unable to be seen right away and feel that you are unable to wait for an appointment, return to the ER right away.

## 2015-07-24 NOTE — ED Notes (Signed)
Patient states that on Thursday morning she got out of the shower and was unable to speak normally, patient was mumbling. Patient also reports that she began having a headache at the same time, pain had came and went since Thursday. Patient has a hx/o CVA.

## 2015-07-26 LAB — URINE CULTURE

## 2015-07-27 ENCOUNTER — Ambulatory Visit: Payer: Medicare Other

## 2015-07-27 ENCOUNTER — Other Ambulatory Visit: Payer: Medicare Other

## 2015-08-12 ENCOUNTER — Ambulatory Visit
Admission: RE | Admit: 2015-08-12 | Discharge: 2015-08-12 | Disposition: A | Discharge status: Home / Self Care | Payer: Medicare Other | Source: Ambulatory Visit | Attending: Surgery | Admitting: Surgery

## 2015-08-12 ENCOUNTER — Other Ambulatory Visit: Payer: Self-pay | Admitting: Surgery

## 2015-08-12 DIAGNOSIS — Z853 Personal history of malignant neoplasm of breast: Secondary | ICD-10-CM

## 2015-08-17 ENCOUNTER — Telehealth: Payer: Self-pay | Admitting: Cardiovascular Disease

## 2015-08-17 NOTE — Telephone Encounter (Signed)
Wants to wait until she talks to Dr. Doy Hutching before scheduling fu appt.  Will call back if needed.    Deleting recall.

## 2015-08-20 DIAGNOSIS — I10 Essential (primary) hypertension: Secondary | ICD-10-CM | POA: Insufficient documentation

## 2015-09-02 DIAGNOSIS — E1121 Type 2 diabetes mellitus with diabetic nephropathy: Secondary | ICD-10-CM | POA: Insufficient documentation

## 2015-11-22 ENCOUNTER — Ambulatory Visit (INDEPENDENT_AMBULATORY_CARE_PROVIDER_SITE_OTHER): Payer: Medicare Other | Admitting: Cardiovascular Disease

## 2015-11-22 ENCOUNTER — Encounter: Payer: Self-pay | Admitting: Cardiovascular Disease

## 2015-11-22 VITALS — BP 178/90 | HR 89 | Ht 64.0 in | Wt 220.5 lb

## 2015-11-22 DIAGNOSIS — I10 Essential (primary) hypertension: Secondary | ICD-10-CM | POA: Diagnosis not present

## 2015-11-22 DIAGNOSIS — E1159 Type 2 diabetes mellitus with other circulatory complications: Secondary | ICD-10-CM

## 2015-11-22 DIAGNOSIS — I25709 Atherosclerosis of coronary artery bypass graft(s), unspecified, with unspecified angina pectoris: Secondary | ICD-10-CM

## 2015-11-22 DIAGNOSIS — I209 Angina pectoris, unspecified: Secondary | ICD-10-CM | POA: Diagnosis not present

## 2015-11-22 DIAGNOSIS — E785 Hyperlipidemia, unspecified: Secondary | ICD-10-CM

## 2015-11-22 MED ORDER — HYDRALAZINE HCL 100 MG PO TABS: 100.0000 mg | ORAL_TABLET | Freq: Four times a day (QID) | ORAL | Status: DC

## 2015-11-22 MED ORDER — FUROSEMIDE 40 MG PO TABS: 40.0000 mg | ORAL_TABLET | Freq: Every day | ORAL | Status: DC | PRN

## 2015-11-22 NOTE — Assessment & Plan Note (Signed)
Currently with no symptoms of angina, no further testing

## 2015-11-22 NOTE — Assessment & Plan Note (Signed)
Recent weight loss with dramatic improvement of her cholesterol down to 199, she does not want a statin and has declined this several times in the past. She does report a history of myalgias.

## 2015-11-22 NOTE — Assessment & Plan Note (Signed)
blood pressure is elevated today, she did not take her morning medications If she does not check her blood pressure at home Recommended she check her blood pressure daily We've also recommended she increase her hydralazine up to 100 mg 4 times a day up from 50 mg She will call us if her blood pressure does not improve Repeat blood pressure more than [id number] systolic on my check

## 2015-11-22 NOTE — Assessment & Plan Note (Signed)
Hemoglobin A1c dramatically improved down to 6.1, previously greater than 7 Encouraged her to continue aggressive diet restriction She is unable to exercise secondary to gait instability

## 2015-11-22 NOTE — Patient Instructions (Addendum)
You are doing well.  Please increase the hydralazine up to 100 mg a day  Please wear compression hose if tolerated for leg swelling during the day Look for the sock outlet  We will check your labs : BMP  Please call us if you have new issues that need to be addressed before your next appt.  Your physician wants you to follow-up in: 6 months.  You will receive a reminder letter in the mail two months in advance. If you don't receive a letter, please call our office to schedule the follow-up appointment.

## 2015-11-22 NOTE — Progress Notes (Signed)
Patient ID: [name], female    DOB: 04/13/1934, 81 y.o.   MRN: [id number]  HPI Comments: Ms. Holtmeyer is a very pleasant 82 yo woman with coronary artery disease, cardiac catheterization at [facility] 08/22/2012 showing severe distal left main, ostial LAD and circumflex disease also with RCA disease transferred to [facility] for a CABG x4, postsurgical stroke with right-sided deficits who presents for followup today of her coronary artery disease She had postsurgical stroke has recovered well. LIMA to LAD, SVG to Diag, SVG to OM1, SVG to PDA, EVH from bilateral thighs  on today's visit, she reports having leg swelling She has been taking Lasix 40 mg daily since Valentine's Day, 6 days total She has noticed an improvement in her mild shortness of breath, no significant change in her leg edema  She has previously seen Dr. Lucky Cowboy, for venous insufficiency She does not like to wear compression hose  She does not do any regular exercise. Legs are getting weaker, no recent falls  Recently in the hospital for pneumonia/diastolic heart failure October 2016, had antibiotics and gentle diuresis   again reiterates that she does not want a statin  Decline in her lab work numbers, hemoglobin A1c 6.1, total cholesterol 199  She does not want a statin  EKG performed on today's visit independently reviewed by myself  shows normal sinus rhythm with rate 90 beats per minute with left axis deviation, poor R-wave progression through the anterior precordial leads, consider old anterior MI  Other past medical history reviewed  She reports that she has tried many medications and they have side effects She denies any significant lower extremity edema, no shortness of breath with exertion. She does report having some nasal stuffiness. Previously seen by ear nose throat, Dr. Tami Ribas She reports having some personality change since the surgery, more irritable Previous cholesterol was 210, LDL 111, hemoglobin A1c  6.6  Current cardiac catheterization report 08/22/2012 showed distal left main 70% disease, ostial LAD of 80%, proximal LAD 70%, distal LAD diffuse 70%, diagonal #150% followed by discrete 95% lesion, ostial circumflex 90% disease, proximal circumflex 70% disease, proximal RCA 50%, distal RCA 60% PDA branch 60%   Allergies  Allergen Reactions  . Ciprocinonide [Fluocinolone]     Feels crazy  . Darvocet [Propoxyphene N-Acetaminophen]     Difficulty breathing and swallowing  . Duratuss G [Guaifenesin]     Other reaction(s): Unknown  . Ivp Dye [Iodinated Diagnostic Agents]     Nausea & vomiting  . Ketek  [Telithromycin]     Other reaction(s): Unknown  . Levaquin  [Levofloxacin] Nausea Only  . Other     Reports all cholesterol medication  . Statins     Muscle aches    Outpatient Encounter Prescriptions as of 11/22/2015  Medication Sig  . aspirin 325 MG EC tablet Take 325 mg by mouth daily.  . Calcium Carbonate-Vitamin D (CALCIUM 500 + D PO) Take 1 tablet by mouth daily.  . cloNIDine (CATAPRES) 0.2 MG tablet Take 1 tablet (0.2 mg total) by mouth 3 (three) times daily.  . cyanocobalamin (,VITAMIN B-12,) 1000 MCG/ML injection Inject 1,000 mcg into the muscle every 30 (thirty) days.  Marland Kitchen gabapentin (NEURONTIN) 100 MG capsule Take 100 mg by mouth 2 (two) times daily.  . hydrALAZINE (APRESOLINE) 100 MG tablet Take 1 tablet (100 mg total) by mouth 4 (four) times daily.  Marland Kitchen HYDROcodone-acetaminophen (NORCO/VICODIN) 5-325 MG tablet Take 1-2 tablets by mouth every 4 (four) hours as needed for moderate pain.  Marland Kitchen  ipratropium-albuterol (DUONEB) 0.5-2.5 (3) MG/3ML SOLN Take 3 mLs by nebulization 4 (four) times daily.  Marland Kitchen levothyroxine (SYNTHROID, LEVOTHROID) 150 MCG tablet Take 150 mcg by mouth daily before breakfast.  . metoprolol (LOPRESSOR) 50 MG tablet Take 1 tablet (50 mg total) by mouth every 8 (eight) hours.  . Omega-3 Fatty Acids (FISH OIL) 1000 MG CAPS Take 1,000 mg by mouth daily.   .  ondansetron (ZOFRAN) 4 MG tablet Take 1 tablet (4 mg total) by mouth every 4 (four) hours as needed for nausea.  Marland Kitchen zolpidem (AMBIEN) 5 MG tablet Take 1 tablet (5 mg total) by mouth at bedtime as needed for sleep.  . [DISCONTINUED] hydrALAZINE (APRESOLINE) 100 MG tablet Take 1 tablet (100 mg total) by mouth 4 (four) times daily.  . [DISCONTINUED] hydrALAZINE (APRESOLINE) 50 MG tablet Take 1 tablet (50 mg total) by mouth 2 (two) times daily.  . furosemide (LASIX) 40 MG tablet Take 1 tablet (40 mg total) by mouth daily as needed.  . [DISCONTINUED] amLODipine (NORVASC) 5 MG tablet Take 1 tablet (5 mg total) by mouth daily. (Patient not taking: Reported on 11/22/2015)  . [DISCONTINUED] furosemide (LASIX) 40 MG tablet Take 1 tablet (40 mg total) by mouth daily.  . [DISCONTINUED] levofloxacin (LEVAQUIN) 750 MG tablet Take 1 tablet (750 mg total) by mouth every other day. (Patient not taking: Reported on 11/22/2015)  . [DISCONTINUED] levothyroxine (SYNTHROID, LEVOTHROID) 125 MCG tablet Take 125 mcg by mouth daily. Reported on 11/22/2015   No facility-administered encounter medications on file as of 11/22/2015.    Past Medical History  Diagnosis Date  . Breast mass, right   . Vertigo   . Thyroid disease   . History of breast cancer     39 treatments of radiation. Negative chemo.  . Diabetes ([place])   . GERD (gastroesophageal reflux disease)   . Hyperlipemia   . History of seasonal allergies   . Arthritis   . Angina pectoris ([place]) 08/26/2012  . Complication of anesthesia   . PONV (postoperative nausea and vomiting)   . Hypertension     sees Dr. Fulton Reek  . Coronary artery disease 08/22/2012    sees Dr Rockey Situ  . Pneumonia     hx of  . Headache(784.0)     migraines  . Gastritis     hx of  . 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 Stroke ([place])   . Cancer [facility])     breast cancer, right side 2012    Past Surgical History   Procedure Laterality Date  . Abdominal hysterectomy    . Ovarian cyst removal    . Back surgery    . Appendectomy    . Breast surgery    . Laparoscopic nissen fundoplication    . Intraoperative transesophageal echocardiogram  09/05/2012    Procedure: INTRAOPERATIVE TRANSESOPHAGEAL ECHOCARDIOGRAM;  Surgeon: Rexene Alberts, MD;  Location: [place];  Service: Open Heart Surgery;  Laterality: N/A;  . Coronary artery bypass graft  09/05/2012    Procedure: CORONARY ARTERY BYPASS GRAFTING (CABG);  Surgeon: Rexene Alberts, MD;  Location: [place];  Service: Open Heart Surgery;  Laterality: N/A;  CABG x four, using left internal mammary artery and bilateral greater saphenous vein harvested endoscopically  . Cardiac catheterization  2014  . Breast biopsy Right     2012 positive  . Breast excisional biopsy Right  2012 positive    Social History  reports that she quit smoking about 27 years ago. Her smoking use included Cigarettes. She does not have any smokeless tobacco history on file. She reports that she does not drink alcohol or use illicit drugs.  Family History family history includes Cancer in her father; Heart disease in her brother and mother; Hyperlipidemia in her brother; Hypertension in her brother. There is no history of Breast cancer.       Review of Systems  Constitutional: Negative.   Respiratory: Negative.   Cardiovascular: Positive for leg swelling.  Gastrointestinal: Negative.   Musculoskeletal: Positive for gait problem.  Skin: Negative.   Neurological: Negative.   Hematological: Negative.   Psychiatric/Behavioral: Negative.   All other systems reviewed and are negative.   BP 178/90 mmHg  Pulse 89  Ht 5\' 4"  (1.626 m)  Wt 220 lb 8 oz (100.018 kg)  BMI 37.83 kg/m2  Physical Exam  Constitutional: She is oriented to person, place, and time. She appears well-developed and well-nourished.  Obese  HENT:  Head: Normocephalic.  Nose: Nose normal.  Mouth/Throat:  Oropharynx is clear and moist.  Eyes: Conjunctivae are normal. Pupils are equal, round, and reactive to light.  Neck: Normal range of motion. Neck supple. No JVD present.  Cardiovascular: Normal rate, regular rhythm, S1 normal, S2 normal, normal heart sounds and intact distal pulses.  Exam reveals no gallop and no friction rub.   No murmur heard. Well healed mediastinal incision Trace pitting edema around the ankles bilaterally  Pulmonary/Chest: Effort normal and breath sounds normal. No respiratory distress. She has no wheezes. She has no rales. She exhibits no tenderness.  Abdominal: Soft. Bowel sounds are normal. She exhibits no distension. There is no tenderness.  Musculoskeletal: Normal range of motion. She exhibits no edema or tenderness.  Lymphadenopathy:    She has no cervical adenopathy.  Neurological: She is alert and oriented to person, place, and time. Coordination normal.  Skin: Skin is warm and dry. No rash noted. No erythema.  Psychiatric: She has a normal mood and affect. Her behavior is normal. Judgment and thought content normal.    Assessment and Plan  Nursing note and vitals reviewed.

## 2015-11-22 NOTE — Assessment & Plan Note (Signed)
Currently with no symptoms of angina. No further workup at this time. Continue current medication regimen. 

## 2015-11-23 LAB — BASIC METABOLIC PANEL
BUN/Creatinine Ratio: 15 (ref 11–26)
BUN: 35 mg/dL — ABNORMAL HIGH (ref 8–27)
CO2: 22 mmol/L (ref 18–29)
Calcium: 8.9 mg/dL (ref 8.7–10.3)
Chloride: 102 mmol/L (ref 96–106)
Creatinine, Ser: 2.29 mg/dL — ABNORMAL HIGH (ref 0.57–1.00)
GFR calc Af Amer: 23 mL/min/{1.73_m2} — ABNORMAL LOW (ref >59)
GFR calc non Af Amer: 20 mL/min/{1.73_m2} — ABNORMAL LOW (ref >59)
Glucose: 123 mg/dL — ABNORMAL HIGH (ref 65–99)
Potassium: 5.7 mmol/L — ABNORMAL HIGH (ref 3.5–5.2)
Sodium: 140 mmol/L (ref 134–144)

## 2015-11-29 ENCOUNTER — Telehealth: Payer: Self-pay | Admitting: *Deleted

## 2015-11-29 ENCOUNTER — Other Ambulatory Visit: Payer: Self-pay | Admitting: *Deleted

## 2015-11-29 NOTE — Telephone Encounter (Signed)
Instructed patient to stop taking her fluid pill lasix for at least one week. She stated that she already had taken it this AM. Let her know that Dr. Rockey Situ wanted her to also stop her potassium as well. Instructed her that after one week she is to only take the lasix as needed for shortness of breath and no more than 3 times a week. Patient verbalized understanding of instructions and let her know to call back if she has any further questions.

## 2016-04-27 ENCOUNTER — Emergency Department: Payer: Medicare Other

## 2016-04-27 ENCOUNTER — Inpatient Hospital Stay
Admission: EM | Admit: 2016-04-27 | Discharge: 2016-05-02 | DRG: 638 | Disposition: A | Discharge status: Home / Self Care | Payer: Medicare Other | Attending: Internal Medicine | Admitting: Internal Medicine

## 2016-04-27 DIAGNOSIS — N179 Acute kidney failure, unspecified: Secondary | ICD-10-CM | POA: Diagnosis present

## 2016-04-27 DIAGNOSIS — Z923 Personal history of irradiation: Secondary | ICD-10-CM

## 2016-04-27 DIAGNOSIS — E785 Hyperlipidemia, unspecified: Secondary | ICD-10-CM | POA: Diagnosis present

## 2016-04-27 DIAGNOSIS — Z79899 Other long term (current) drug therapy: Secondary | ICD-10-CM

## 2016-04-27 DIAGNOSIS — E860 Dehydration: Secondary | ICD-10-CM | POA: Diagnosis present

## 2016-04-27 DIAGNOSIS — E11649 Type 2 diabetes mellitus with hypoglycemia without coma: Principal | ICD-10-CM | POA: Diagnosis present

## 2016-04-27 DIAGNOSIS — R531 Weakness: Secondary | ICD-10-CM | POA: Diagnosis not present

## 2016-04-27 DIAGNOSIS — Z888 Allergy status to other drugs, medicaments and biological substances status: Secondary | ICD-10-CM

## 2016-04-27 DIAGNOSIS — Z951 Presence of aortocoronary bypass graft: Secondary | ICD-10-CM

## 2016-04-27 DIAGNOSIS — Z8673 Personal history of transient ischemic attack (TIA), and cerebral infarction without residual deficits: Secondary | ICD-10-CM

## 2016-04-27 DIAGNOSIS — Z7982 Long term (current) use of aspirin: Secondary | ICD-10-CM

## 2016-04-27 DIAGNOSIS — Z9049 Acquired absence of other specified parts of digestive tract: Secondary | ICD-10-CM

## 2016-04-27 DIAGNOSIS — R140 Abdominal distension (gaseous): Secondary | ICD-10-CM

## 2016-04-27 DIAGNOSIS — N184 Chronic kidney disease, stage 4 (severe): Secondary | ICD-10-CM | POA: Diagnosis present

## 2016-04-27 DIAGNOSIS — R0602 Shortness of breath: Secondary | ICD-10-CM

## 2016-04-27 DIAGNOSIS — Z87891 Personal history of nicotine dependence: Secondary | ICD-10-CM

## 2016-04-27 DIAGNOSIS — I129 Hypertensive chronic kidney disease with stage 1 through stage 4 chronic kidney disease, or unspecified chronic kidney disease: Secondary | ICD-10-CM | POA: Diagnosis present

## 2016-04-27 DIAGNOSIS — Z9889 Other specified postprocedural states: Secondary | ICD-10-CM

## 2016-04-27 DIAGNOSIS — D649 Anemia, unspecified: Secondary | ICD-10-CM

## 2016-04-27 DIAGNOSIS — I25119 Atherosclerotic heart disease of native coronary artery with unspecified angina pectoris: Secondary | ICD-10-CM | POA: Diagnosis present

## 2016-04-27 DIAGNOSIS — N390 Urinary tract infection, site not specified: Secondary | ICD-10-CM

## 2016-04-27 DIAGNOSIS — M1990 Unspecified osteoarthritis, unspecified site: Secondary | ICD-10-CM | POA: Diagnosis present

## 2016-04-27 DIAGNOSIS — Z8249 Family history of ischemic heart disease and other diseases of the circulatory system: Secondary | ICD-10-CM

## 2016-04-27 DIAGNOSIS — E1140 Type 2 diabetes mellitus with diabetic neuropathy, unspecified: Secondary | ICD-10-CM | POA: Diagnosis present

## 2016-04-27 DIAGNOSIS — Z853 Personal history of malignant neoplasm of breast: Secondary | ICD-10-CM

## 2016-04-27 DIAGNOSIS — R110 Nausea: Secondary | ICD-10-CM

## 2016-04-27 DIAGNOSIS — E162 Hypoglycemia, unspecified: Secondary | ICD-10-CM

## 2016-04-27 DIAGNOSIS — Z90710 Acquired absence of both cervix and uterus: Secondary | ICD-10-CM

## 2016-04-27 DIAGNOSIS — K567 Ileus, unspecified: Secondary | ICD-10-CM

## 2016-04-27 DIAGNOSIS — E1122 Type 2 diabetes mellitus with diabetic chronic kidney disease: Secondary | ICD-10-CM | POA: Diagnosis present

## 2016-04-27 DIAGNOSIS — Z809 Family history of malignant neoplasm, unspecified: Secondary | ICD-10-CM

## 2016-04-27 DIAGNOSIS — N1330 Unspecified hydronephrosis: Secondary | ICD-10-CM

## 2016-04-27 DIAGNOSIS — K219 Gastro-esophageal reflux disease without esophagitis: Secondary | ICD-10-CM | POA: Diagnosis present

## 2016-04-27 DIAGNOSIS — E039 Hypothyroidism, unspecified: Secondary | ICD-10-CM | POA: Diagnosis present

## 2016-04-27 DIAGNOSIS — D631 Anemia in chronic kidney disease: Secondary | ICD-10-CM | POA: Diagnosis present

## 2016-04-27 DIAGNOSIS — R809 Proteinuria, unspecified: Secondary | ICD-10-CM | POA: Diagnosis present

## 2016-04-27 LAB — URINALYSIS COMPLETE WITH MICROSCOPIC (ARMC ONLY)
Bilirubin Urine: NEGATIVE
Glucose, UA: NEGATIVE mg/dL
Ketones, ur: NEGATIVE mg/dL
Nitrite: NEGATIVE
Protein, ur: 100 mg/dL — AB
Specific Gravity, Urine: 1.01 (ref 1.005–1.030)
pH: 5 (ref 5.0–8.0)

## 2016-04-27 LAB — CBC WITH DIFFERENTIAL/PLATELET
Basophils Absolute: 0.1 10*3/uL (ref 0–0.1)
Basophils Relative: 1 %
Eosinophils Absolute: 0 10*3/uL (ref 0–0.7)
Eosinophils Relative: 0 %
HCT: 26.6 % — ABNORMAL LOW (ref 35.0–47.0)
Hemoglobin: 8.9 g/dL — ABNORMAL LOW (ref 12.0–16.0)
Lymphocytes Relative: 3 %
Lymphs Abs: 0.4 10*3/uL — ABNORMAL LOW (ref 1.0–3.6)
MCH: 28 pg (ref 26.0–34.0)
MCHC: 33.6 g/dL (ref 32.0–36.0)
MCV: 83.6 fL (ref 80.0–100.0)
Monocytes Absolute: 0.4 10*3/uL (ref 0.2–0.9)
Monocytes Relative: 4 %
Neutro Abs: 11.4 10*3/uL — ABNORMAL HIGH (ref 1.4–6.5)
Neutrophils Relative %: 92 %
Platelets: 228 10*3/uL (ref 150–440)
RBC: 3.19 MIL/uL — ABNORMAL LOW (ref 3.80–5.20)
RDW: 15.4 % — ABNORMAL HIGH (ref 11.5–14.5)
WBC: 12.3 10*3/uL — ABNORMAL HIGH (ref 3.6–11.0)

## 2016-04-27 LAB — GLUCOSE, CAPILLARY: Glucose-Capillary: 129 mg/dL — ABNORMAL HIGH (ref 65–99)

## 2016-04-27 MED ORDER — IPRATROPIUM-ALBUTEROL 0.5-2.5 (3) MG/3ML IN SOLN: 3.0000 mL | Freq: Once | RESPIRATORY_TRACT | Status: DC

## 2016-04-27 NOTE — ED Provider Notes (Signed)
[facility] Emergency Department Provider Note   ____________________________________________   First MD Initiated Contact with Patient 04/27/16 2310     (approximate)  I have reviewed the triage vital signs and the nursing notes.   HISTORY  Chief Complaint Hypoglycemia    HPI [name] is a 82 y.o. female who presents to the ED from home via EMS with a chief complaint of weakness. Patient is a diabetic on oral hypoglycemics only who slid off her couch secondary to weakness. She was found to have a blood sugar of 40. Patient denies losing consciousness. She was able to drink Gatorade and eat some peanut butter on scene and her recheck blood sugar was over 200 at the house. In route, EMS reports blood sugar of 90. Patient reports lack of appetite for the past couple of days secondary to congested cough productive of clear sputum.Denies associated fever, chills, chest pain, shortness of breath, abdominal pain, vomiting, diarrhea, dysuria. Does complain of vague nausea. Nothing makes her symptoms worse. Food made her symptoms better.   Past Medical History:  Diagnosis Date  . Angina pectoris ([place]) 08/26/2012  . Arthritis   . Breast mass, right   . Cancer [facility])    breast cancer, right side 2012  . Complication of anesthesia   . Coronary artery disease 08/22/2012   sees Dr Rockey Situ  . Diabetes ([place])   . Gastritis    hx of  . GERD (gastroesophageal reflux disease)   . Headache(784.0)    migraines  . History of breast cancer    39 treatments of radiation. Negative chemo.  Marland Kitchen History of seasonal allergies   . Hyperlipemia   . Hypertension    sees Dr. [place] Reek  . Pneumonia    hx of  . PONV (postoperative nausea and vomiting)   . 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 Stroke ([place])   . Thyroid disease   . Vertigo     Patient Active Problem List   Diagnosis Date Noted  .  Pneumonia 07/17/2015  . Cerebral embolism with cerebral infarction ([place]) 09/07/2012  . Hemiplegia, unspecified, affecting dominant side 09/07/2012  . 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 Angina pectoris ([place]) 08/26/2012  . Coronary artery disease 08/22/2012  . Throat pain 08/19/2012  . Shortness of breath 08/19/2012  . Hyperlipidemia 08/19/2012  . Diabetes ([place]) 08/19/2012  . Hypertension 08/19/2012  . Breast cancer ([place]) 08/19/2012  . GERD (gastroesophageal reflux disease) 08/19/2012    Past Surgical History:  Procedure Laterality Date  . ABDOMINAL HYSTERECTOMY    . APPENDECTOMY    . BACK SURGERY    . BREAST BIOPSY Right    2012 positive  . BREAST EXCISIONAL BIOPSY Right    2012 positive  . BREAST SURGERY    . CARDIAC CATHETERIZATION  2014  . CORONARY ARTERY BYPASS GRAFT  09/05/2012   Procedure: CORONARY ARTERY BYPASS GRAFTING (CABG);  Surgeon: Rexene Alberts, MD;  Location: [place];  Service: Open Heart Surgery;  Laterality: N/A;  CABG x four, using left internal mammary artery and bilateral greater saphenous vein harvested endoscopically  . INTRAOPERATIVE TRANSESOPHAGEAL ECHOCARDIOGRAM  09/05/2012   Procedure: INTRAOPERATIVE TRANSESOPHAGEAL ECHOCARDIOGRAM;  Surgeon: Rexene Alberts, MD;  Location: [place];  Service: Open Heart Surgery;  Laterality: N/A;  . LAPAROSCOPIC NISSEN FUNDOPLICATION    . OVARIAN CYST REMOVAL  Prior to Admission medications   Medication Sig Start Date End Date Taking? Authorizing Provider  aspirin 325 MG EC tablet Take 325 mg by mouth daily.    Historical Provider, MD  Calcium Carbonate-Vitamin D (CALCIUM 500 + D PO) Take 1 tablet by mouth daily.    Historical Provider, MD  cloNIDine (CATAPRES) 0.2 MG tablet Take 1 tablet (0.2 mg total) by mouth 3 (three) times daily. 07/20/15   Idelle Crouch, MD  cyanocobalamin (,VITAMIN B-12,) 1000 MCG/ML injection Inject 1,000 mcg into the muscle every 30 (thirty) days.    Historical Provider, MD  furosemide (LASIX) 40 MG  tablet Take 1 tablet (40 mg total) by mouth daily as needed. 11/22/15   Minna Merritts, MD  gabapentin (NEURONTIN) 100 MG capsule Take 100 mg by mouth 2 (two) times daily. 09/23/12   Bary Leriche, PA-C  hydrALAZINE (APRESOLINE) 100 MG tablet Take 1 tablet (100 mg total) by mouth 4 (four) times daily. 11/22/15   Minna Merritts, MD  HYDROcodone-acetaminophen (NORCO/VICODIN) 5-325 MG tablet Take 1-2 tablets by mouth every 4 (four) hours as needed for moderate pain. 07/20/15   Idelle Crouch, MD  ipratropium-albuterol (DUONEB) 0.5-2.5 (3) MG/3ML SOLN Take 3 mLs by nebulization 4 (four) times daily. 07/20/15   Idelle Crouch, MD  levothyroxine (SYNTHROID, LEVOTHROID) 150 MCG tablet Take 150 mcg by mouth daily before breakfast.    Historical Provider, MD  metoprolol (LOPRESSOR) 50 MG tablet Take 1 tablet (50 mg total) by mouth every 8 (eight) hours. 07/20/15   Idelle Crouch, MD  Omega-3 Fatty Acids (FISH OIL) 1000 MG CAPS Take 1,000 mg by mouth daily.     Historical Provider, MD  ondansetron (ZOFRAN) 4 MG tablet Take 1 tablet (4 mg total) by mouth every 4 (four) hours as needed for nausea. 07/20/15   Idelle Crouch, MD  zolpidem (AMBIEN) 5 MG tablet Take 1 tablet (5 mg total) by mouth at bedtime as needed for sleep. 07/20/15   Idelle Crouch, MD    Allergies Ciprocinonide [fluocinolone]; Darvocet [propoxyphene n-acetaminophen]; Duratuss g [guaifenesin]; Ivp dye [iodinated diagnostic agents]; Ketek  [telithromycin]; Levaquin  [levofloxacin]; Other; and Statins  Family History  Problem Relation Age of Onset  . Heart disease Brother     CABG & stents  . Hyperlipidemia Brother   . Hypertension Brother   . Cancer Father   . Heart disease Mother   . Breast cancer Neg Hx     Social History Social History  Substance Use Topics  . Smoking status: Former Smoker    Types: Cigarettes    Quit date: 11/13/1988  . Smokeless tobacco: Never Used  . Alcohol use No    Review of  Systems  Constitutional: Positive for generalized weakness. No fever/chills. Eyes: No visual changes. ENT: No sore throat. Cardiovascular: Denies chest pain. Respiratory: Positive for productive cough. Denies shortness of breath. Gastrointestinal: No abdominal pain.  No nausea, no vomiting.  No diarrhea.  No constipation. Genitourinary: Negative for dysuria. Musculoskeletal: Negative for back pain. Skin: Negative for rash. Neurological: Negative for headaches, focal weakness or numbness.  10-point ROS otherwise negative.  ____________________________________________   PHYSICAL EXAM:  VITAL SIGNS: ED Triage Vitals [04/27/16 2245]  Enc Vitals Group     BP      Pulse      Resp 18     Temp 98.1 F (36.7 C)     Temp Source Oral     SpO2  Weight      Height      Head Circumference      Peak Flow      Pain Score      Pain Loc      Pain Edu?      Excl. in [place]?     Constitutional: Alert and oriented. Well appearing and in mild acute distress. Eyes: Conjunctivae are normal. PERRL. EOMI. Head: Atraumatic. Nose: No congestion/rhinnorhea. Mouth/Throat: Mucous membranes are moist.  Oropharynx non-erythematous.  Mildly hoarse voice. Neck: No stridor.   Cardiovascular: Normal rate, regular rhythm. Grossly normal heart sounds.  Good peripheral circulation. Respiratory: Normal respiratory effort.  No retractions. Lungs with scattered wheezing. Gastrointestinal: Soft and nontender. No distention. No abdominal bruits. No CVA tenderness. Musculoskeletal: No lower extremity tenderness nor edema.  No joint effusions. Neurologic:  Normal speech and language. No gross focal neurologic deficits are appreciated.  Skin:  Skin is warm, dry and intact. No rash noted. Psychiatric: Mood and affect are normal. Speech and behavior are normal.  ____________________________________________   LABS (all labs ordered are listed, but only abnormal results are displayed)  Labs Reviewed   GLUCOSE, CAPILLARY - Abnormal; Notable for the following:       Result Value   Glucose-Capillary 129 (*)    All other components within normal limits  CBC WITH DIFFERENTIAL/PLATELET - Abnormal; Notable for the following:    WBC 12.3 (*)    RBC 3.19 (*)    Hemoglobin 8.9 (*)    HCT 26.6 (*)    RDW 15.4 (*)    Neutro Abs 11.4 (*)    Lymphs Abs 0.4 (*)    All other components within normal limits  COMPREHENSIVE METABOLIC PANEL - Abnormal; Notable for the following:    Sodium 131 (*)    Chloride 93 (*)    Glucose, Bld 121 (*)    BUN 55 (*)    Creatinine, Ser 4.37 (*)    Calcium 7.6 (*)    Albumin 2.6 (*)    ALT 11 (*)    GFR calc non Af Amer 9 (*)    GFR calc Af Amer 10 (*)    All other components within normal limits  TROPONIN I - Abnormal; Notable for the following:    Troponin I 0.05 (*)    All other components within normal limits  URINALYSIS COMPLETEWITH MICROSCOPIC ([facility] ONLY) - Abnormal; Notable for the following:    Color, Urine YELLOW (*)    APPearance CLOUDY (*)    Hgb urine dipstick 3+ (*)    Protein, ur 100 (*)    Leukocytes, UA 1+ (*)    Bacteria, UA FEW (*)    Squamous Epithelial / LPF 0-5 (*)    All other components within normal limits  URINE CULTURE   ____________________________________________  EKG  ED ECG REPORT I, Fatou Dunnigan J, the attending physician, personally viewed and interpreted this ECG.   Date: 04/27/2016  EKG Time: 2341  Rate: 92  Rhythm: normal EKG, normal sinus rhythm  Axis: Normal  Intervals:none  ST&T Change: Nonspecific  ____________________________________________  RADIOLOGY  Portable chest x-ray (viewed by me, interpreted per Dr. Golden Circle): No acute abnormality noted. ____________________________________________   PROCEDURES  Procedure(s) performed:   Rectal exam: External exam WNL. Tan stool on gloved finger which is heme negative.  Procedures  Critical Care performed:  No  ____________________________________________   INITIAL IMPRESSION / ASSESSMENT AND PLAN / ED COURSE  Pertinent labs & imaging results that were available during my  care of the patient were reviewed by me and considered in my medical decision making (see chart for details).  82 year old female who presents with hypoglycemia in the setting of decreased appetite and congested cough. Will obtain screening lab work, chest x-ray, urinalysis and closely monitor blood sugar. Will initiate DuoNeb treatment for wheezing obtain on exam.  Clinical Course  Comment By Time  Urinalysis results noted. IV Rocephin ordered. Noted anemia which is baseline and slightly higher for patient compared to prior hemoglobin and hematocrit. Paulette Blanch, MD 07/28 0017  Noted laboratory values indicative of acute renal failure and anemia. Review of chart reveals that patient's PCP did some lab work on a recent appointment which showed her BUN was 38 and creatinine was 3.2. She was taken off her Lasix and encouraged to drink more water. Troponin noted to be elevated. Given the above, will discuss with hospitalist to evaluate patient in the emergency department for admission. Paulette Blanch, MD 07/28 0038     ____________________________________________   FINAL CLINICAL IMPRESSION(S) / ED DIAGNOSES  Final diagnoses:  Weakness  Hypoglycemia  Acute renal failure, unspecified acute renal failure type (HCC)  Anemia, unspecified anemia type  UTI (lower urinary tract infection)      NEW MEDICATIONS STARTED DURING THIS VISIT:  New Prescriptions   No medications on file     Note:  This document was prepared using Dragon voice recognition software and may include unintentional dictation errors.    Paulette Blanch, MD 04/28/16 [id number]

## 2016-04-27 NOTE — ED Triage Notes (Signed)
Pt was brought in by EMS.  Pt slid off of couch and was found to have a blood sugar of 40.  Pt was able to eat and drink on scene and blood sugar came back up to 200s at house.  In route, ems reports BS of 90.  Pt alert and oriented.  Per EMS pt having upper airway congestion and were concerned for pneumonia.

## 2016-04-28 ENCOUNTER — Inpatient Hospital Stay: Payer: Medicare Other

## 2016-04-28 DIAGNOSIS — R531 Weakness: Secondary | ICD-10-CM | POA: Diagnosis present

## 2016-04-28 DIAGNOSIS — Z853 Personal history of malignant neoplasm of breast: Secondary | ICD-10-CM | POA: Diagnosis not present

## 2016-04-28 DIAGNOSIS — Z79899 Other long term (current) drug therapy: Secondary | ICD-10-CM | POA: Diagnosis not present

## 2016-04-28 DIAGNOSIS — N1330 Unspecified hydronephrosis: Secondary | ICD-10-CM | POA: Diagnosis present

## 2016-04-28 DIAGNOSIS — K219 Gastro-esophageal reflux disease without esophagitis: Secondary | ICD-10-CM | POA: Diagnosis present

## 2016-04-28 DIAGNOSIS — N179 Acute kidney failure, unspecified: Secondary | ICD-10-CM | POA: Diagnosis present

## 2016-04-28 DIAGNOSIS — Z8249 Family history of ischemic heart disease and other diseases of the circulatory system: Secondary | ICD-10-CM | POA: Diagnosis not present

## 2016-04-28 DIAGNOSIS — Z8673 Personal history of transient ischemic attack (TIA), and cerebral infarction without residual deficits: Secondary | ICD-10-CM | POA: Diagnosis not present

## 2016-04-28 DIAGNOSIS — Z809 Family history of malignant neoplasm, unspecified: Secondary | ICD-10-CM | POA: Diagnosis not present

## 2016-04-28 DIAGNOSIS — E1140 Type 2 diabetes mellitus with diabetic neuropathy, unspecified: Secondary | ICD-10-CM | POA: Diagnosis present

## 2016-04-28 DIAGNOSIS — Z951 Presence of aortocoronary bypass graft: Secondary | ICD-10-CM | POA: Diagnosis not present

## 2016-04-28 DIAGNOSIS — E860 Dehydration: Secondary | ICD-10-CM | POA: Diagnosis present

## 2016-04-28 DIAGNOSIS — I129 Hypertensive chronic kidney disease with stage 1 through stage 4 chronic kidney disease, or unspecified chronic kidney disease: Secondary | ICD-10-CM | POA: Diagnosis present

## 2016-04-28 DIAGNOSIS — Z90710 Acquired absence of both cervix and uterus: Secondary | ICD-10-CM | POA: Diagnosis not present

## 2016-04-28 DIAGNOSIS — Z9889 Other specified postprocedural states: Secondary | ICD-10-CM | POA: Diagnosis not present

## 2016-04-28 DIAGNOSIS — N184 Chronic kidney disease, stage 4 (severe): Secondary | ICD-10-CM | POA: Diagnosis present

## 2016-04-28 DIAGNOSIS — Z9049 Acquired absence of other specified parts of digestive tract: Secondary | ICD-10-CM | POA: Diagnosis not present

## 2016-04-28 DIAGNOSIS — Z7982 Long term (current) use of aspirin: Secondary | ICD-10-CM | POA: Diagnosis not present

## 2016-04-28 DIAGNOSIS — M1990 Unspecified osteoarthritis, unspecified site: Secondary | ICD-10-CM | POA: Diagnosis present

## 2016-04-28 DIAGNOSIS — E11649 Type 2 diabetes mellitus with hypoglycemia without coma: Secondary | ICD-10-CM | POA: Diagnosis present

## 2016-04-28 DIAGNOSIS — E039 Hypothyroidism, unspecified: Secondary | ICD-10-CM | POA: Diagnosis present

## 2016-04-28 DIAGNOSIS — I25119 Atherosclerotic heart disease of native coronary artery with unspecified angina pectoris: Secondary | ICD-10-CM | POA: Diagnosis present

## 2016-04-28 DIAGNOSIS — Z87891 Personal history of nicotine dependence: Secondary | ICD-10-CM | POA: Diagnosis not present

## 2016-04-28 DIAGNOSIS — E1122 Type 2 diabetes mellitus with diabetic chronic kidney disease: Secondary | ICD-10-CM | POA: Diagnosis present

## 2016-04-28 DIAGNOSIS — Z888 Allergy status to other drugs, medicaments and biological substances status: Secondary | ICD-10-CM | POA: Diagnosis not present

## 2016-04-28 LAB — GLUCOSE, CAPILLARY
Glucose-Capillary: 107 mg/dL — ABNORMAL HIGH (ref 65–99)
Glucose-Capillary: 108 mg/dL — ABNORMAL HIGH (ref 65–99)
Glucose-Capillary: 110 mg/dL — ABNORMAL HIGH (ref 65–99)
Glucose-Capillary: 122 mg/dL — ABNORMAL HIGH (ref 65–99)
Glucose-Capillary: 142 mg/dL — ABNORMAL HIGH (ref 65–99)
Glucose-Capillary: 152 mg/dL — ABNORMAL HIGH (ref 65–99)
Glucose-Capillary: 154 mg/dL — ABNORMAL HIGH (ref 65–99)
Glucose-Capillary: 174 mg/dL — ABNORMAL HIGH (ref 65–99)
Glucose-Capillary: 189 mg/dL — ABNORMAL HIGH (ref 65–99)
Glucose-Capillary: 193 mg/dL — ABNORMAL HIGH (ref 65–99)
Glucose-Capillary: 23 mg/dL — CL (ref 65–99)
Glucose-Capillary: 45 mg/dL — ABNORMAL LOW (ref 65–99)
Glucose-Capillary: 49 mg/dL — ABNORMAL LOW (ref 65–99)
Glucose-Capillary: 61 mg/dL — ABNORMAL LOW (ref 65–99)
Glucose-Capillary: 61 mg/dL — ABNORMAL LOW (ref 65–99)
Glucose-Capillary: 80 mg/dL (ref 65–99)

## 2016-04-28 LAB — COMPREHENSIVE METABOLIC PANEL
ALT: 11 U/L — ABNORMAL LOW (ref 14–54)
AST: 24 U/L (ref 15–41)
Albumin: 2.6 g/dL — ABNORMAL LOW (ref 3.5–5.0)
Alkaline Phosphatase: 90 U/L (ref 38–126)
Anion gap: 12 (ref 5–15)
BUN: 55 mg/dL — ABNORMAL HIGH (ref 6–20)
CO2: 26 mmol/L (ref 22–32)
Calcium: 7.6 mg/dL — ABNORMAL LOW (ref 8.9–10.3)
Chloride: 93 mmol/L — ABNORMAL LOW (ref 101–111)
Creatinine, Ser: 4.37 mg/dL — ABNORMAL HIGH (ref 0.44–1.00)
GFR calc Af Amer: 10 mL/min — ABNORMAL LOW (ref >60)
GFR calc non Af Amer: 9 mL/min — ABNORMAL LOW (ref >60)
Glucose, Bld: 121 mg/dL — ABNORMAL HIGH (ref 65–99)
Potassium: 3.7 mmol/L (ref 3.5–5.1)
Sodium: 131 mmol/L — ABNORMAL LOW (ref 135–145)
Total Bilirubin: 0.3 mg/dL (ref 0.3–1.2)
Total Protein: 7.8 g/dL (ref 6.5–8.1)

## 2016-04-28 LAB — BASIC METABOLIC PANEL
Anion gap: 11 (ref 5–15)
BUN: 59 mg/dL — ABNORMAL HIGH (ref 6–20)
CO2: 26 mmol/L (ref 22–32)
Calcium: 7.7 mg/dL — ABNORMAL LOW (ref 8.9–10.3)
Chloride: 97 mmol/L — ABNORMAL LOW (ref 101–111)
Creatinine, Ser: 4.48 mg/dL — ABNORMAL HIGH (ref 0.44–1.00)
GFR calc Af Amer: 10 mL/min — ABNORMAL LOW (ref >60)
GFR calc non Af Amer: 9 mL/min — ABNORMAL LOW (ref >60)
Glucose, Bld: 27 mg/dL — CL (ref 65–99)
Potassium: 2.8 mmol/L — ABNORMAL LOW (ref 3.5–5.1)
Sodium: 134 mmol/L — ABNORMAL LOW (ref 135–145)

## 2016-04-28 LAB — BETA-HYDROXYBUTYRIC ACID: Beta-Hydroxybutyric Acid: <0.05 mmol/L — ABNORMAL LOW (ref 0.05–0.27)

## 2016-04-28 LAB — HEMOGLOBIN A1C

## 2016-04-28 LAB — TSH: TSH: 3.69 u[IU]/mL (ref 0.350–4.500)

## 2016-04-28 LAB — TROPONIN I: Troponin I: 0.05 ng/mL (ref <0.03)

## 2016-04-28 LAB — MRSA PCR SCREENING: MRSA by PCR: NEGATIVE

## 2016-04-28 MED ORDER — SODIUM CHLORIDE 0.9 % IV SOLN
INTRAVENOUS | Status: DC
  Administered 2016-04-28 – 2016-05-01 (×5): via INTRAVENOUS

## 2016-04-28 MED ORDER — LABETALOL HCL 5 MG/ML IV SOLN
10.0000 mg | INTRAVENOUS | Status: DC | PRN
  Administered 2016-04-28: 10 mg via INTRAVENOUS
  Filled 2016-04-28 (×2): qty 4

## 2016-04-28 MED ORDER — HYDRALAZINE HCL 20 MG/ML IJ SOLN
INTRAMUSCULAR | Status: AC
  Administered 2016-04-28: 20 mg
  Filled 2016-04-28: qty 1

## 2016-04-28 MED ORDER — OMEGA-3-ACID ETHYL ESTERS 1 G PO CAPS
1.0000 g | ORAL_CAPSULE | Freq: Every day | ORAL | Status: DC
  Administered 2016-04-28 – 2016-05-02 (×5): 1 g via ORAL
  Filled 2016-04-28 (×5): qty 1

## 2016-04-28 MED ORDER — FUROSEMIDE 10 MG/ML IJ SOLN
20.0000 mg | Freq: Once | INTRAMUSCULAR | Status: AC
Start: 2016-04-28 — End: 2016-04-28
  Administered 2016-04-28: 20 mg via INTRAVENOUS
  Filled 2016-04-28: qty 2

## 2016-04-28 MED ORDER — POTASSIUM CHLORIDE CRYS ER 20 MEQ PO TBCR
40.0000 meq | EXTENDED_RELEASE_TABLET | Freq: Once | ORAL | Status: AC
  Administered 2016-04-28: 40 meq via ORAL
  Filled 2016-04-28: qty 2

## 2016-04-28 MED ORDER — HYDROCODONE-ACETAMINOPHEN 5-325 MG PO TABS
1.0000 | ORAL_TABLET | ORAL | Status: DC | PRN
  Administered 2016-04-29: 1 via ORAL
  Filled 2016-04-28 (×2): qty 1

## 2016-04-28 MED ORDER — ZOLPIDEM TARTRATE 5 MG PO TABS: 5.0000 mg | ORAL_TABLET | Freq: Every evening | ORAL | Status: DC | PRN

## 2016-04-28 MED ORDER — CLONIDINE HCL 0.1 MG PO TABS
0.2000 mg | ORAL_TABLET | Freq: Three times a day (TID) | ORAL | Status: DC
  Administered 2016-04-28 – 2016-05-02 (×13): 0.2 mg via ORAL
  Filled 2016-04-28 (×12): qty 2

## 2016-04-28 MED ORDER — DEXTROSE 10 % IV SOLN
INTRAVENOUS | Status: DC
  Administered 2016-04-28 – 2016-04-30 (×3): via INTRAVENOUS

## 2016-04-28 MED ORDER — IPRATROPIUM-ALBUTEROL 0.5-2.5 (3) MG/3ML IN SOLN: 3.0000 mL | Freq: Four times a day (QID) | RESPIRATORY_TRACT | Status: DC

## 2016-04-28 MED ORDER — HYDRALAZINE HCL 50 MG PO TABS
100.0000 mg | ORAL_TABLET | Freq: Four times a day (QID) | ORAL | Status: DC
  Administered 2016-04-28 – 2016-05-02 (×16): 100 mg via ORAL
  Filled 2016-04-28 (×16): qty 2

## 2016-04-28 MED ORDER — DEXTROSE 5 % IV SOLN
1.0000 g | INTRAVENOUS | Status: DC
  Administered 2016-04-29 – 2016-05-01 (×3): 1 g via INTRAVENOUS
  Filled 2016-04-28 (×4): qty 10

## 2016-04-28 MED ORDER — CYANOCOBALAMIN 1000 MCG/ML IJ SOLN
1000.0000 ug | INTRAMUSCULAR | Status: DC
  Administered 2016-04-28: 1000 ug via INTRAMUSCULAR
  Filled 2016-04-28: qty 1

## 2016-04-28 MED ORDER — HYDRALAZINE HCL 20 MG/ML IJ SOLN: 10.0000 mg | Freq: Four times a day (QID) | INTRAMUSCULAR | Status: DC | PRN

## 2016-04-28 MED ORDER — DEXTROSE 50 % IV SOLN
2.0000 | Freq: Once | INTRAVENOUS | Status: AC
  Administered 2016-04-28: 100 mL via INTRAVENOUS
  Filled 2016-04-28: qty 100

## 2016-04-28 MED ORDER — DOCUSATE SODIUM 100 MG PO CAPS
100.0000 mg | ORAL_CAPSULE | Freq: Two times a day (BID) | ORAL | Status: DC
  Administered 2016-04-28 – 2016-05-02 (×8): 100 mg via ORAL
  Filled 2016-04-28 (×8): qty 1

## 2016-04-28 MED ORDER — SODIUM CHLORIDE 0.9 % IV BOLUS (SEPSIS)
1000.0000 mL | INTRAVENOUS | Status: AC
  Administered 2016-04-28: 1000 mL via INTRAVENOUS

## 2016-04-28 MED ORDER — DEXTROSE 50 % IV SOLN
25.0000 mL | Freq: Once | INTRAVENOUS | Status: DC
  Filled 2016-04-28: qty 50

## 2016-04-28 MED ORDER — LEVOTHYROXINE SODIUM 75 MCG PO TABS
150.0000 ug | ORAL_TABLET | Freq: Every day | ORAL | Status: DC
  Administered 2016-04-28 – 2016-05-02 (×5): 150 ug via ORAL
  Filled 2016-04-28: qty 1
  Filled 2016-04-28: qty 2
  Filled 2016-04-28: qty 3
  Filled 2016-04-28 (×2): qty 2

## 2016-04-28 MED ORDER — ASPIRIN EC 325 MG PO TBEC
325.0000 mg | DELAYED_RELEASE_TABLET | Freq: Every day | ORAL | Status: DC
  Administered 2016-04-28 – 2016-05-02 (×5): 325 mg via ORAL
  Filled 2016-04-28 (×5): qty 1

## 2016-04-28 MED ORDER — IPRATROPIUM-ALBUTEROL 0.5-2.5 (3) MG/3ML IN SOLN
3.0000 mL | Freq: Four times a day (QID) | RESPIRATORY_TRACT | Status: DC
  Filled 2016-04-28: qty 3

## 2016-04-28 MED ORDER — ACETAMINOPHEN 325 MG PO TABS: 650.0000 mg | ORAL_TABLET | Freq: Four times a day (QID) | ORAL | Status: DC | PRN

## 2016-04-28 MED ORDER — ONDANSETRON HCL 4 MG PO TABS: 4.0000 mg | ORAL_TABLET | Freq: Four times a day (QID) | ORAL | Status: DC | PRN

## 2016-04-28 MED ORDER — SODIUM CHLORIDE 0.9 % IV BOLUS (SEPSIS): 1000.0000 mL | Freq: Once | INTRAVENOUS | Status: DC

## 2016-04-28 MED ORDER — PROMETHAZINE HCL 25 MG/ML IJ SOLN
12.5000 mg | Freq: Four times a day (QID) | INTRAMUSCULAR | Status: DC | PRN
  Administered 2016-04-28: 12.5 mg via INTRAVENOUS

## 2016-04-28 MED ORDER — GABAPENTIN 100 MG PO CAPS
100.0000 mg | ORAL_CAPSULE | Freq: Two times a day (BID) | ORAL | Status: DC
  Administered 2016-04-28 – 2016-05-02 (×9): 100 mg via ORAL
  Filled 2016-04-28 (×9): qty 1

## 2016-04-28 MED ORDER — ONDANSETRON HCL 4 MG PO TABS
4.0000 mg | ORAL_TABLET | ORAL | Status: DC | PRN
  Administered 2016-05-01 – 2016-05-02 (×2): 4 mg via ORAL
  Filled 2016-04-28 (×2): qty 1

## 2016-04-28 MED ORDER — METOPROLOL TARTRATE 50 MG PO TABS
50.0000 mg | ORAL_TABLET | Freq: Three times a day (TID) | ORAL | Status: DC
  Administered 2016-04-28 – 2016-05-02 (×13): 50 mg via ORAL
  Filled 2016-04-28 (×13): qty 1

## 2016-04-28 MED ORDER — SODIUM CHLORIDE 0.9 % IV SOLN
INTRAVENOUS | Status: DC
  Administered 2016-04-28: 03:00:00 via INTRAVENOUS

## 2016-04-28 MED ORDER — HEPARIN SODIUM (PORCINE) 5000 UNIT/ML IJ SOLN
5000.0000 [IU] | Freq: Three times a day (TID) | INTRAMUSCULAR | Status: DC
  Administered 2016-04-28 – 2016-05-02 (×11): 5000 [IU] via SUBCUTANEOUS
  Filled 2016-04-28 (×11): qty 1

## 2016-04-28 MED ORDER — DEXTROSE-NACL 5-0.9 % IV SOLN
INTRAVENOUS | Status: DC
  Administered 2016-04-28: 07:00:00 via INTRAVENOUS

## 2016-04-28 MED ORDER — PROMETHAZINE HCL 25 MG/ML IJ SOLN
12.5000 mg | Freq: Four times a day (QID) | INTRAMUSCULAR | Status: DC | PRN
  Filled 2016-04-28: qty 1

## 2016-04-28 MED ORDER — ACETAMINOPHEN 650 MG RE SUPP: 650.0000 mg | Freq: Four times a day (QID) | RECTAL | Status: DC | PRN

## 2016-04-28 MED ORDER — ONDANSETRON HCL 4 MG/2ML IJ SOLN
4.0000 mg | Freq: Four times a day (QID) | INTRAMUSCULAR | Status: DC | PRN
  Administered 2016-04-28 – 2016-04-30 (×2): 4 mg via INTRAVENOUS
  Filled 2016-04-28 (×2): qty 2

## 2016-04-28 MED ORDER — DEXTROSE 50 % IV SOLN
2.0000 | INTRAVENOUS | Status: AC
  Administered 2016-04-28: 100 mL via INTRAVENOUS

## 2016-04-28 MED ORDER — DEXTROSE 50 % IV SOLN
INTRAVENOUS | Status: AC
  Filled 2016-04-28: qty 50

## 2016-04-28 MED ORDER — FUROSEMIDE 40 MG PO TABS: 40.0000 mg | ORAL_TABLET | Freq: Every day | ORAL | Status: DC | PRN

## 2016-04-28 MED ORDER — IPRATROPIUM-ALBUTEROL 0.5-2.5 (3) MG/3ML IN SOLN: 3.0000 mL | Freq: Four times a day (QID) | RESPIRATORY_TRACT | Status: DC | PRN

## 2016-04-28 MED ORDER — DEXTROSE 50 % IV SOLN
INTRAVENOUS | Status: AC
  Administered 2016-04-28: 07:00:00
  Filled 2016-04-28: qty 50

## 2016-04-28 MED ORDER — SULFAMETHOXAZOLE-TRIMETHOPRIM 800-160 MG PO TABS: 1.0000 | ORAL_TABLET | Freq: Two times a day (BID) | ORAL | Status: DC

## 2016-04-28 MED ORDER — DEXTROSE 5 % IV SOLN
1.0000 g | INTRAVENOUS | Status: DC
  Administered 2016-04-28: 1 g via INTRAVENOUS
  Filled 2016-04-28 (×2): qty 10

## 2016-04-28 NOTE — Progress Notes (Signed)
Lab called with crit blood sugar of 27, rechecked FSBS for 23. Patient difficult to arouse. MD Rosilyn Mings called and new oders amp d50 and dextrose 5% with NS.  See new orders.  Patients FSBS rechecked for 157 and became more arousable.  Patient had urinated on the bed. Nurse aid [facility] gave a bath and changed the sheets.  Patient is A+O with no signs of distress at this time.

## 2016-04-28 NOTE — Progress Notes (Signed)
PT Cancellation Note  Patient Details Name: [name] MRN: [id number] DOB: 11/27/1933   Cancelled Treatment:    Reason Eval/Treat Not Completed: Other (comment).  Pt's potassium decreased from 3.7 (PM 7/27) to 2.8 (AM 7/28) and pt not on telemetry.  Per PT guidelines, will hold PT at this time.  Will re-attempt PT eval at a later date/time as medically appropriate.   Raquel Sarna Dorien [place] 04/28/2016, 9:16 AM Leitha Bleak, [place]

## 2016-04-28 NOTE — Plan of Care (Signed)
Blood sugar recheck 107.  Will continue to monitor and re-check as needed.

## 2016-04-28 NOTE — Plan of Care (Addendum)
Blood sugar 45. Dr. Ree Kida. Will round and make changes as needed.  Patient given OJx2 and graham crackers.  Will continue to monitor and recheck in about 30 minutes.

## 2016-04-28 NOTE — Plan of Care (Addendum)
Patient diaphoretic and pale.  Began to dry heave. "I don't feel good," stated patient.  Zofran IV given but seemed to not help.  Patient vitals outside baseline and indicated acute change in patient status.  Rechecked BS was 193 (after 2 amps of D50). Rapid called, and Dr. Kandice Robinsons.  Dr. Hulen Skains and stated he was calling Dr. Verdell Carmine d/t he was no longer in hospital.  Dr. Arrived in room about 5 minutes later.  Phenergan and hydrolozine order placed - meds pushed.   BP rechecked 220/90.  Dr. Informed and asked to continue to monitor and give meds a little longer to work.  Dr. Asked to contact him with any further concerns since Dr. Earleen Newport is currently not in hospital.

## 2016-04-28 NOTE — Care Management Important Message (Signed)
Important Message  Patient Details  Name: [name] MRN: [id number] Date of Birth: 27-May-1934   Medicare Important Message Given:  Yes    Alvie Heidelberg, RN 04/28/2016, 4:05 PM

## 2016-04-28 NOTE — Plan of Care (Addendum)
Recheck blood sugar 49 - Dr notified and orders placed. Will continue to monitor and recheck in about 30 min.

## 2016-04-28 NOTE — Progress Notes (Signed)
OT Cancellation Note  Patient Details Name: AFTON GARVIE MRN: [id number] DOB: Nov 01, 1933   Cancelled Treatment:    Reason Eval/Treat Not Completed: Medical issues which prohibited therapy (Pt. presents with low Gluclose Level of 27.  OT skilled services are not warranted at this time. Will monitor, and eval as appropriate.)   Harrel Carina, MS, OTR/L  Harrel Carina 04/28/2016, 8:37 AM

## 2016-04-28 NOTE — Plan of Care (Signed)
Report called to Step Down, S/W Darlyn Chamber, RN. Patient currently sleeping and appears to be in no distress, though vitals still off baseline.  Transport called to move to Rm2 and family informed of transfer.

## 2016-04-28 NOTE — Progress Notes (Signed)
RT responded to rapid response page.  Patient confused, SOB, has had issues with blood sugars throughout the day.  Dr. Verdell Carmine in to see patient and has no further orders for RT.  Patient on 3LPM  97%

## 2016-04-28 NOTE — Care Management (Signed)
Admitted with near syncope secondary to hypoglycemia. Met with patient at bedside. She reports she lives at home with her spouse. She is independent, active and drives. Uses no assistive devices, no home O2 and has no home health. PCP is Dr. Doy Hutching. Uses [facility]. No home health needs identified at this time.

## 2016-04-28 NOTE — Progress Notes (Signed)
Rapid response called.  Per RN- patient had low blood sugars though at the recheck was 193.  BP was in the [id number] systolic when checked manually.  Patient was nauseated- Dr. Verdell Carmine ordered Phenergan and stated that he would place medications for elevated BP.  If patient did not improved he stated he would send her to [facility] ICU.

## 2016-04-28 NOTE — Progress Notes (Signed)
Patient ID: [name], female   DOB: 07-Apr-1934, 82 y.o.   MRN: [id number]  [employer] PROGRESS NOTE  [name] [id number] DOB: [birth year] DOA: 04/27/2016 PCP: Idelle Crouch, MD  HPI/Subjective: Patient presents after feeling weak and sliding off her chair. Found to have a low blood sugar. Still feels weak now. Appetite poor. Patient does not recall the names of her diabetic medication that she takes  at home.  Objective: Vitals:   04/28/16 0800 04/28/16 0941  BP: (!) 169/82 (!) 179/78  Pulse: (!) 104   Resp: 18   Temp: 98.6 F (37 C)     Filed Weights   04/28/16 0500  Weight: 99.8 kg (220 lb)    ROS: Review of Systems  Constitutional: Negative for chills and fever.  Eyes: Negative for blurred vision.  Respiratory: Negative for cough and shortness of breath.   Cardiovascular: Negative for chest pain.  Gastrointestinal: Negative for abdominal pain, constipation, diarrhea, nausea and vomiting.  Genitourinary: Negative for dysuria.  Musculoskeletal: Negative for joint pain.  Neurological: Positive for weakness. Negative for dizziness and headaches.   Exam: Physical Exam  Constitutional: She is oriented to person, place, and time.  HENT:  Nose: No mucosal edema.  Mouth/Throat: No oropharyngeal exudate or posterior oropharyngeal edema.  Eyes: Conjunctivae, EOM and lids are normal. Pupils are equal, round, and reactive to light.  Neck: No JVD present. Carotid bruit is not present. No edema present. No thyroid mass and no thyromegaly present.  Cardiovascular: S1 normal and S2 normal.  Tachycardia present.  Exam reveals no gallop.   No murmur heard. Pulses:      Dorsalis pedis pulses are 2+ on the right side, and 2+ on the left side.  Respiratory: No respiratory distress. She has no wheezes. She has rhonchi in the right lower field and the left lower field. She has no rales.  GI: Soft. Bowel sounds are normal. There is no tenderness.   Musculoskeletal:       Right ankle: She exhibits swelling.       Left ankle: She exhibits swelling.  Lymphadenopathy:    She has no cervical adenopathy.  Neurological: She is alert and oriented to person, place, and time. No cranial nerve deficit.  Skin: Skin is warm. No rash noted. Nails show no clubbing.  Psychiatric: She has a normal mood and affect.      Data Reviewed: Basic Metabolic Panel:  Recent Labs Lab 04/27/16 2306 04/28/16 0440  NA 131* 134*  K 3.7 2.8*  CL 93* 97*  CO2 26 26  GLUCOSE 121* 27*  BUN 55* 59*  CREATININE 4.37* 4.48*  CALCIUM 7.6* 7.7*   Liver Function Tests:  Recent Labs Lab 04/27/16 2306  AST 24  ALT 11*  ALKPHOS 90  BILITOT 0.3  PROT 7.8  ALBUMIN 2.6*   CBC:  Recent Labs Lab 04/27/16 2306  WBC 12.3*  NEUTROABS 11.4*  HGB 8.9*  HCT 26.6*  MCV 83.6  PLT 228   Cardiac Enzymes:  Recent Labs Lab 04/27/16 2306  TROPONINI 0.05*   BNP (last 3 results)  Recent Labs  07/17/15 0806 07/21/15 0819  BNP 818.0* 526.0*     CBG:  Recent Labs Lab 04/28/16 0910 04/28/16 1211 04/28/16 1302 04/28/16 1345 04/28/16 1428  GLUCAP 61* 45* 49* 189* 107*    No results found for this or any previous visit (from the past 240 hour(s)).   Studies: Dg Abd 1 View  Result Date: 04/28/2016 CLINICAL DATA:  Abdominal distention. EXAM: ABDOMEN - 1 VIEW COMPARISON:  None. FINDINGS: Multiple gas-filled nondilated small bowel loops are seen within the abdomen or pelvis. There is also colonic gas and stool to the level the rectum, without evidence of colonic dilatation. No radiopaque calculi identified. IMPRESSION: Nonspecific, nonobstructive bowel gas pattern. Mild ileus cannot be excluded. Electronically Signed   By: Earle Gell M.D.   On: 04/28/2016 12:41  Dg Chest Port 1 View  Result Date: 04/27/2016 CLINICAL DATA:  Recent episode of hypoglycemia EXAM: PORTABLE CHEST 1 VIEW COMPARISON:  07/21/2015 FINDINGS: Cardiac shadow remains  enlarged. Postsurgical changes are again seen. The lungs are clear bilaterally. No bony abnormality is seen. IMPRESSION: No acute abnormality noted. Electronically Signed   By: Inez Catalina M.D.   On: 04/27/2016 23:29   Scheduled Meds: . aspirin  325 mg Oral Daily  . [START ON 04/29/2016] cefTRIAXone (ROCEPHIN)  IV  1 g Intravenous Q24H  . cloNIDine  0.2 mg Oral TID  . cyanocobalamin  1,000 mcg Intramuscular Q30 days  . dextrose  25 mL Intravenous Once  . docusate sodium  100 mg Oral BID  . gabapentin  100 mg Oral BID  . heparin  5,000 Units Subcutaneous Q8H  . hydrALAZINE  100 mg Oral QID  . ipratropium-albuterol  3 mL Nebulization Once  . levothyroxine  150 mcg Oral QAC breakfast  . metoprolol  50 mg Oral Q8H  . omega-3 acid ethyl esters  1 g Oral Daily  . sodium chloride  1,000 mL Intravenous Once   Continuous Infusions: . sodium chloride 50 mL/hr at 04/28/16 1339  . dextrose 50 mL/hr at 04/28/16 1342    Assessment/Plan:  1. Repeated hypoglycemia. The patient does not recall her diabetic medication. Looking back through the last note of Dr. Doy Hutching, she seems to be on Amaryl 4 mg twice day. This can cause prolonged hypoglycemia. I started D10 drip. Sent off sulfonylurea screen, proinsulin, C-peptide, insulin. Hemoglobin A1c still pending. 2. Acute kidney injury on chronic kidney disease stage III. Continue IV fluid hydration with normal saline also. Hold Lasix. Hold nephrotoxic agents. 3. Accelerated hypertension. Continue clonidine, metoprolol and hydralazine 4. Acute cystitis with hematuria on Rocephin 5. Hypothyroidism unspecified on levothyroxine 6. Diabetic neuropathy on gabapentin 7. History of stroke on aspirin 8. Weakness physical therapy evaluation 9. Spoke with pharmacist about updating the medication reconciliation.  Code Status:     Code Status Orders        Start     Ordered   04/28/16 0158  Full code  Continuous     04/28/16 0157    Code Status History     Date Active Date Inactive Code Status Order ID Comments User Context   07/17/2015 11:11 AM 07/20/2015  2:32 PM Full Code [id number]  Bettey Costa, MD Inpatient   09/10/2012  7:56 PM 09/23/2012  3:32 PM Full Code [id number]  Lorraine Lax, RN Inpatient   09/09/2012  8:17 AM 09/10/2012  7:56 PM Full Code [id number]  Rexene Alberts, MD Inpatient   09/05/2012  1:24 PM 09/09/2012  8:17 AM Full Code [id number]  Edwyna Perfect, RN Inpatient     Family Communication: Family at the bedside Disposition Plan: Will need to see kidney function improved prior to disposition and sugars to be holding.  Antibiotics:  Rocephin  Time spent: 35 minutes  Loletha Grayer  [employer]

## 2016-04-28 NOTE — Plan of Care (Signed)
Patient is extremely weak and states "I just doesn't feel well." Blood sugar recheck 61.  Patient given OJ, will continue to monitor and recheck as needed.  Dr. Also notified of her blood sugar's insistence to drop (despite interventions).

## 2016-04-28 NOTE — Plan of Care (Addendum)
Rechecked BP 225/120. HR 124.  Dr. Hulen Skains and gave verbal to push 10mg  labetolol.

## 2016-04-28 NOTE — H&P (Signed)
[name] is an 82 y.o. female.   Chief Complaint: Near-syncope HPI: The patient with past medical history of diabetes and coronary artery disease status post CABG presents emergency department after an episode of near syncope. She states that she slid off of her couch but does not remember much after that. She was found to be hypoglycemic by EMS with a blood sugar of 40. The patient took her oral diabetic medication earlier today but she admits that she has not had much to eat. Her blood sugar was stabilized in the emergency department where she was found to have urinary tract infection as well as significant decline in kidney function. She was given a dose of ceftriaxone in the emergency department for the hospitalist service was called for admission.  Past Medical History:  Diagnosis Date  . Angina pectoris ([place]) 08/26/2012  . Arthritis   . Breast mass, right   . Cancer [facility])    breast cancer, right side 2012  . Complication of anesthesia   . Coronary artery disease 08/22/2012   sees Dr Rockey Situ  . Diabetes ([place])   . Gastritis    hx of  . GERD (gastroesophageal reflux disease)   . Headache(784.0)    migraines  . History of breast cancer    39 treatments of radiation. Negative chemo.  Marland Kitchen History of seasonal allergies   . Hyperlipemia   . Hypertension    sees Dr. Fulton Reek  . Pneumonia    hx of  . PONV (postoperative nausea and vomiting)   . 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 Stroke ([place])   . Thyroid disease   . Vertigo     Past Surgical History:  Procedure Laterality Date  . ABDOMINAL HYSTERECTOMY    . APPENDECTOMY    . BACK SURGERY    . BREAST BIOPSY Right    2012 positive  . BREAST EXCISIONAL BIOPSY Right    2012 positive  . BREAST SURGERY    . CARDIAC CATHETERIZATION  2014  . CORONARY ARTERY BYPASS GRAFT  09/05/2012   Procedure: CORONARY ARTERY BYPASS GRAFTING (CABG);  Surgeon: Rexene Alberts, MD;  Location: [facility];  Service: Open Heart Surgery;  Laterality: N/A;  CABG x four, using left internal mammary artery and bilateral greater saphenous vein harvested endoscopically  . INTRAOPERATIVE TRANSESOPHAGEAL ECHOCARDIOGRAM  09/05/2012   Procedure: INTRAOPERATIVE TRANSESOPHAGEAL ECHOCARDIOGRAM;  Surgeon: Rexene Alberts, MD;  Location: [facility];  Service: Open Heart Surgery;  Laterality: N/A;  . LAPAROSCOPIC NISSEN FUNDOPLICATION    . OVARIAN CYST REMOVAL      Family History  Problem Relation Age of Onset  . Heart disease Brother     CABG & stents  . Hyperlipidemia Brother   . Hypertension Brother   . Cancer Father   . Heart disease Mother   . Breast cancer Neg Hx    Social History:  reports that she quit smoking about 27 years ago. Her smoking use included Cigarettes. She has never used smokeless tobacco. She reports that she does not drink alcohol or use drugs.  Allergies:  Allergies  Allergen Reactions  . Ciprocinonide [Fluocinolone]     Feels crazy  . Darvocet [Propoxyphene N-Acetaminophen]     Difficulty breathing and swallowing  . Duratuss G [Guaifenesin]     Other reaction(s): Unknown  . Ivp Dye [Iodinated Diagnostic Agents]     Nausea &  vomiting  . Ketek  [Telithromycin]     Other reaction(s): Unknown  . Levaquin  [Levofloxacin] Nausea Only  . Other     Reports all cholesterol medication  . Statins     Muscle aches    Prior to Admission medications   Medication Sig Start Date End Date Taking? Authorizing Provider  aspirin 325 MG EC tablet Take 325 mg by mouth daily.    Historical Provider, MD  Calcium Carbonate-Vitamin D (CALCIUM 500 + D PO) Take 1 tablet by mouth daily.    Historical Provider, MD  cloNIDine (CATAPRES) 0.2 MG tablet Take 1 tablet (0.2 mg total) by mouth 3 (three) times daily. 07/20/15   Idelle Crouch, MD  cyanocobalamin (,VITAMIN B-12,) 1000 MCG/ML injection Inject 1,000 mcg into the muscle every 30 (thirty) days.    Historical  Provider, MD  furosemide (LASIX) 40 MG tablet Take 1 tablet (40 mg total) by mouth daily as needed. 11/22/15   Minna Merritts, MD  gabapentin (NEURONTIN) 100 MG capsule Take 100 mg by mouth 2 (two) times daily. 09/23/12   Bary Leriche, PA-C  hydrALAZINE (APRESOLINE) 100 MG tablet Take 1 tablet (100 mg total) by mouth 4 (four) times daily. 11/22/15   Minna Merritts, MD  HYDROcodone-acetaminophen (NORCO/VICODIN) 5-325 MG tablet Take 1-2 tablets by mouth every 4 (four) hours as needed for moderate pain. 07/20/15   Idelle Crouch, MD  ipratropium-albuterol (DUONEB) 0.5-2.5 (3) MG/3ML SOLN Take 3 mLs by nebulization 4 (four) times daily. 07/20/15   Idelle Crouch, MD  levothyroxine (SYNTHROID, LEVOTHROID) 150 MCG tablet Take 150 mcg by mouth daily before breakfast.    Historical Provider, MD  metoprolol (LOPRESSOR) 50 MG tablet Take 1 tablet (50 mg total) by mouth every 8 (eight) hours. 07/20/15   Idelle Crouch, MD  Omega-3 Fatty Acids (FISH OIL) 1000 MG CAPS Take 1,000 mg by mouth daily.     Historical Provider, MD  ondansetron (ZOFRAN) 4 MG tablet Take 1 tablet (4 mg total) by mouth every 4 (four) hours as needed for nausea. 07/20/15   Idelle Crouch, MD  zolpidem (AMBIEN) 5 MG tablet Take 1 tablet (5 mg total) by mouth at bedtime as needed for sleep. 07/20/15   Idelle Crouch, MD     Results for orders placed or performed during the hospital encounter of 04/27/16 (from the past 48 hour(s))  Glucose, capillary     Status: Abnormal   Collection Time: 04/27/16 10:53 PM  Result Value Ref Range   Glucose-Capillary 129 (H) 65 - 99 mg/dL   Comment 1 Notify RN    Comment 2 Document in Chart   CBC with Differential     Status: Abnormal   Collection Time: 04/27/16 11:06 PM  Result Value Ref Range   WBC 12.3 (H) 3.6 - 11.0 K/uL   RBC 3.19 (L) 3.80 - 5.20 MIL/uL   Hemoglobin 8.9 (L) 12.0 - 16.0 g/dL   HCT 26.6 (L) 35.0 - 47.0 %   MCV 83.6 80.0 - 100.0 fL   MCH 28.0 26.0 - 34.0 pg   MCHC  33.6 32.0 - 36.0 g/dL   RDW 15.4 (H) 11.5 - 14.5 %   Platelets 228 150 - 440 K/uL   Neutrophils Relative % 92 %   Neutro Abs 11.4 (H) 1.4 - 6.5 K/uL   Lymphocytes Relative 3 %   Lymphs Abs 0.4 (L) 1.0 - 3.6 K/uL   Monocytes Relative 4 %   Monocytes Absolute  0.4 0.2 - 0.9 K/uL   Eosinophils Relative 0 %   Eosinophils Absolute 0.0 0 - 0.7 K/uL   Basophils Relative 1 %   Basophils Absolute 0.1 0 - 0.1 K/uL  Comprehensive metabolic panel     Status: Abnormal   Collection Time: 04/27/16 11:06 PM  Result Value Ref Range   Sodium 131 (L) 135 - 145 mmol/L   Potassium 3.7 3.5 - 5.1 mmol/L   Chloride 93 (L) 101 - 111 mmol/L   CO2 26 22 - 32 mmol/L   Glucose, Bld 121 (H) 65 - 99 mg/dL   BUN 55 (H) 6 - 20 mg/dL   Creatinine, Ser 4.37 (H) 0.44 - 1.00 mg/dL   Calcium 7.6 (L) 8.9 - 10.3 mg/dL   Total Protein 7.8 6.5 - 8.1 g/dL   Albumin 2.6 (L) 3.5 - 5.0 g/dL   AST 24 15 - 41 U/L   ALT 11 (L) 14 - 54 U/L   Alkaline Phosphatase 90 38 - 126 U/L   Total Bilirubin 0.3 0.3 - 1.2 mg/dL   GFR calc non Af Amer 9 (L) >60 mL/min   GFR calc Af Amer 10 (L) >60 mL/min    Comment: (NOTE) The eGFR has been calculated using the CKD EPI equation. This calculation has not been validated in all clinical situations. eGFR's persistently <60 mL/min signify possible Chronic Kidney Disease.    Anion gap 12 5 - 15  Troponin I     Status: Abnormal   Collection Time: 04/27/16 11:06 PM  Result Value Ref Range   Troponin I 0.05 (HH) <0.03 ng/mL    Comment: CRITICAL RESULT CALLED TO, READ BACK BY AND VERIFIED WITH RAQUEL DAVID ON 04/28/16 AT 0028 BY TLB   Urinalysis complete, with microscopic ([place] only)     Status: Abnormal   Collection Time: 04/27/16 11:06 PM  Result Value Ref Range   Color, Urine YELLOW (A) YELLOW   APPearance CLOUDY (A) CLEAR   Glucose, UA NEGATIVE NEGATIVE mg/dL   Bilirubin Urine NEGATIVE NEGATIVE   Ketones, ur NEGATIVE NEGATIVE mg/dL   Specific Gravity, Urine 1.010 1.005 - 1.030   Hgb  urine dipstick 3+ (A) NEGATIVE   pH 5.0 5.0 - 8.0   Protein, ur 100 (A) NEGATIVE mg/dL   Nitrite NEGATIVE NEGATIVE   Leukocytes, UA 1+ (A) NEGATIVE   RBC / HPF TOO NUMEROUS TO COUNT 0 - 5 RBC/hpf   WBC, UA TOO NUMEROUS TO COUNT 0 - 5 WBC/hpf   Bacteria, UA FEW (A) NONE SEEN   Squamous Epithelial / LPF 0-5 (A) NONE SEEN   Mucous PRESENT   Glucose, capillary     Status: Abnormal   Collection Time: 04/28/16 12:48 AM  Result Value Ref Range   Glucose-Capillary 110 (H) 65 - 99 mg/dL   Comment 1 Notify RN    Dg Chest Port 1 View  Result Date: 04/27/2016 CLINICAL DATA:  Recent episode of hypoglycemia EXAM: PORTABLE CHEST 1 VIEW COMPARISON:  07/21/2015 FINDINGS: Cardiac shadow remains enlarged. Postsurgical changes are again seen. The lungs are clear bilaterally. No bony abnormality is seen. IMPRESSION: No acute abnormality noted. Electronically Signed   By: Inez Catalina M.D.   On: 04/27/2016 23:29   Review of Systems  Constitutional: Negative for fever.  HENT: Negative for sore throat.   Eyes: Negative for blurred vision.  Respiratory: Positive for cough. Negative for shortness of breath.   Cardiovascular: Negative for chest pain, orthopnea and leg swelling.  Gastrointestinal: Positive for nausea.  Negative for abdominal pain, blood in stool, diarrhea and vomiting.  Genitourinary: Positive for frequency. Negative for dysuria.  Musculoskeletal: Negative for falls and myalgias.  Skin: Negative for rash.  Neurological: Positive for focal weakness (right weakness chronic since stroke ) and weakness. Negative for dizziness and speech change.  Endo/Heme/Allergies: Positive for polydipsia. Does not bruise/bleed easily.  Psychiatric/Behavioral: Negative for depression and hallucinations.    Blood pressure 134/61, pulse 87, temperature 98.1 F (36.7 C), temperature source Oral, resp. rate 18, SpO2 93 %. Physical Exam  Vitals reviewed. Constitutional: She is oriented to person, place, and  time. She appears well-developed and well-nourished. No distress.  HENT:  Head: Normocephalic and atraumatic.  Mouth/Throat: Oropharynx is clear and moist.  Eyes: Conjunctivae and EOM are normal. Pupils are equal, round, and reactive to light. No scleral icterus.  Neck: Normal range of motion. Neck supple. No JVD present. No tracheal deviation present. No thyromegaly present.  Cardiovascular: Normal rate, regular rhythm and normal heart sounds.  Exam reveals no gallop and no friction rub.   No murmur heard. Respiratory: Effort normal and breath sounds normal. No respiratory distress.  GI: Soft. Bowel sounds are normal. She exhibits distension. She exhibits no mass. There is no tenderness. There is no rebound and no guarding.  Genitourinary:  Genitourinary Comments: Deferred  Musculoskeletal: Normal range of motion. She exhibits no edema.  Lymphadenopathy:    She has no cervical adenopathy.  Neurological: She is alert and oriented to person, place, and time. No cranial nerve deficit. She exhibits normal muscle tone.  Skin: Skin is warm and dry. No rash noted. No erythema.  Psychiatric: She has a normal mood and affect. Her behavior is normal. Judgment and thought content normal.     Assessment/Plan This is a 82 year old female admitted for hypoglycemia, acute kidney injury and urinary tract infection. 1. Hypoglycemia: Resolved; we will allow the patient to eat as many snacks as she needs until blood sugar is consistently normal. Hold all diabetic medication for now. 2. Acute kidney injury: Likely secondary to dehydration even though the patient reports that she has been drinking plenty except for today. Baseline from February is 2.29; observe for improvement with intravenous fluid. Avoid nephrotoxic agents. Lasix as needed (the patient had stopped taking this medicine completely approximately 6 months ago). 3. Urinary tract infection: Ceftriaxone given in the emergency department. Also the  patient on oral Bactrim in the morning. 4. Coronary artery disease: Stable; status post CABG. Continue aspirin. 5. Essential hypertension: Continue hydralazine and metoprolol. The patient is also on clonidine which I have continued for now. Reassess need for this medication as we observe patient's blood pressure in the hospital. 6. Diabetes mellitus type 2: Check hemoglobin A1c. The patient cannot recall if she took metformin or Amaryl (the latter of which had been prescribed in the past). Will check lactic acid. (Also obtain an abdominal film to explore possible causes of abdominal distention). Reassess medication needs once blood sugar is stabilized. 7. Hypothyroidism: Continue Synthroid 8. Hyperlipidemia: Continue Omega -3 fatty acid supplementation 9. DVT prophylaxis: Heparin 10. GI prophylaxis: None The patient is a full code. Time spent on admission orders and patient care approximately 45 minutes   Harrie Foreman, MD 04/28/2016, 1:51 AM

## 2016-04-28 NOTE — Progress Notes (Addendum)
Inpatient Diabetes Program Recommendations  AACE/ADA: New Consensus Statement on Inpatient Glycemic Control (2015)  Target Ranges:  Prepandial:   less than 140 mg/dL      Peak postprandial:   less than 180 mg/dL (1-2 hours)      Critically ill patients:  140 - 180 mg/dL   Results for [name], [name] (MRN [id number]) as of 04/28/2016 10:45  Ref. Range 04/27/2016 22:53 04/28/2016 00:48 04/28/2016 06:24 04/28/2016 06:42 04/28/2016 09:10  Glucose-Capillary Latest Ref Range: 65 - 99 mg/dL 129 (H) 110 (H) 23 (LL) 154 (H) 61 (L)   Review of Glycemic Control  Diabetes history: DM2 Outpatient Diabetes medications: No DM medications listed on home med list in chart; However noted in office visit note by Dr. Doy Hutching on 04/12/16 that patient is prescribed Amaryl 4 mg BID Current orders for Inpatient glycemic control: CBG monitoring  Inpatient Diabetes Program Recommendations: Correction (SSI): Note patient continues to experience hypoglycemia (likely from outpatient DM oral medication). Recommend checking glucose Q1H until consistently greater than 150 mg/dl without hypoglycemia treatment then change to CBGs Q4H and order Novolog sensitive correction when appropriate. HgbA1C: Last A1C was 5.5% on 04/14/16 (per Care Everywhere). A1C in process. Outpatient DM medications: Please consider adjusting outpatient DM medications as A1C was 5.5% on 04/14/16 (per Care Everywhere) which indicates an average glucose of 111 mg/dl.  Thanks, Barnie Alderman, RN, MSN, CDE Diabetes Coordinator Inpatient Diabetes Program [phone] (Team Pager from [place] to [place]) [phone] ([place] office) [phone] [facility] office) [phone] [facility] office)

## 2016-04-28 NOTE — Progress Notes (Signed)
Called to evaluate patient as she was noted to be lethargic, tachycardic and also hypertensive.  Upon arrival pt. Is having some nausea and spitting up.  BS was noted to be in the 60's and patient given 2 amps of D50 and it has improved.   PE  Gen - pt. Lying in be in mild distress due to N/V.  Heart - S1, S2, Tachy.  RRR. No murmurs. Lungs - mild upper airway wheezing b/l, bibasilar rales, no rhonchi Abd - soft, distended, + BS, no organomegaly Ext - no cyanosis, clubbing, edema b/l Neuro - AAO X 3.  No focal deficits b/l.   Assessment & Plan  1. N/V - etiology unclear but X-ray earlier showing some mild ileus. - will give Phenergan PRN.  Repeat X-ray in a.m.  - supportive care w/ IV fluids, anti-emetics  2. Hypoglycemia - cont. D10 gtt and follow BS  3. Accelerated HTN - due to GI distress.  - will given IV hydralazine and monitor.   4. Shortness of breath - get CXR now and follow - cont. IV fluids, D10 for now.    Discussed plan with nursing staff.   Time Spent: 25 min.

## 2016-04-28 NOTE — Plan of Care (Signed)
Patient given 2 Amps D50, and chaanged fluids to NS @50  and Dextrose 10% @50  per orders.  Recheck Blood sugar 189.  Will continue to monitor and recheck as needed.

## 2016-04-29 ENCOUNTER — Inpatient Hospital Stay: Payer: Medicare Other

## 2016-04-29 LAB — CBC
HCT: 24.1 % — ABNORMAL LOW (ref 35.0–47.0)
Hemoglobin: 8.4 g/dL — ABNORMAL LOW (ref 12.0–16.0)
MCH: 28.8 pg (ref 26.0–34.0)
MCHC: 34.9 g/dL (ref 32.0–36.0)
MCV: 82.5 fL (ref 80.0–100.0)
Platelets: 194 10*3/uL (ref 150–440)
RBC: 2.93 MIL/uL — ABNORMAL LOW (ref 3.80–5.20)
RDW: 15 % — ABNORMAL HIGH (ref 11.5–14.5)
WBC: 10 10*3/uL (ref 3.6–11.0)

## 2016-04-29 LAB — BASIC METABOLIC PANEL
Anion gap: 6 (ref 5–15)
BUN: 54 mg/dL — ABNORMAL HIGH (ref 6–20)
CO2: 28 mmol/L (ref 22–32)
Calcium: 7.4 mg/dL — ABNORMAL LOW (ref 8.9–10.3)
Chloride: 102 mmol/L (ref 101–111)
Creatinine, Ser: 4.31 mg/dL — ABNORMAL HIGH (ref 0.44–1.00)
GFR calc Af Amer: 10 mL/min — ABNORMAL LOW (ref >60)
GFR calc non Af Amer: 9 mL/min — ABNORMAL LOW (ref >60)
Glucose, Bld: 87 mg/dL (ref 65–99)
Potassium: 3.9 mmol/L (ref 3.5–5.1)
Sodium: 136 mmol/L (ref 135–145)

## 2016-04-29 LAB — GLUCOSE, CAPILLARY
Glucose-Capillary: 98 mg/dL (ref 65–99)
Glucose-Capillary: 89 mg/dL (ref 65–99)
Glucose-Capillary: 85 mg/dL (ref 65–99)
Glucose-Capillary: 64 mg/dL — ABNORMAL LOW (ref 65–99)
Glucose-Capillary: 99 mg/dL (ref 65–99)
Glucose-Capillary: 170 mg/dL — ABNORMAL HIGH (ref 65–99)
Glucose-Capillary: 157 mg/dL — ABNORMAL HIGH (ref 65–99)
Glucose-Capillary: 110 mg/dL — ABNORMAL HIGH (ref 65–99)
Glucose-Capillary: 87 mg/dL (ref 65–99)
Glucose-Capillary: 159 mg/dL — ABNORMAL HIGH (ref 65–99)

## 2016-04-29 LAB — URINE CULTURE
Culture: <10000 — AB
Special Requests: NORMAL

## 2016-04-29 LAB — INSULIN AND C-PEPTIDE, SERUM
C-Peptide: 35.1 ng/mL — ABNORMAL HIGH (ref 1.1–4.4)
Insulin: 90.6 u[IU]/mL — ABNORMAL HIGH (ref 2.6–24.9)

## 2016-04-29 LAB — MISC LABCORP TEST (SEND OUT): Labcorp test code: 1453

## 2016-04-29 MED ORDER — PHENAZOPYRIDINE HCL 100 MG PO TABS
100.0000 mg | ORAL_TABLET | Freq: Three times a day (TID) | ORAL | Status: DC
  Administered 2016-04-29 (×2): 100 mg via ORAL
  Filled 2016-04-29 (×3): qty 1

## 2016-04-29 NOTE — Progress Notes (Signed)
Patient's 08:00 CBG check was 64. Patient alert and oriented, no reported or observed symptoms. Patient given 4 oz of juice to drink and then meal tray arrived. Patient ended up drink 8 oz of juice between RN and meal tray. CBG on recheck was 99.

## 2016-04-29 NOTE — Progress Notes (Signed)
PT Cancellation Note  Patient Details Name: [name] MRN: [id number] DOB: December 07, 1933   Cancelled Treatment:    Reason Eval/Treat Not Completed: Medical issues which prohibited therapy Pt transferred from floor to CCU with unstable lab values and vitals.  Will complete Physical Therapy orders at this time, pt will need new orders for PT when she is appropriate.  Kreg Shropshire 04/29/2016, 7:54 AM

## 2016-04-29 NOTE — Progress Notes (Signed)
Report given to Danielle on 1C. Wilnette Kales

## 2016-04-29 NOTE — Progress Notes (Signed)
[place] at [place] NAME: [name]    MRN#:  [id number]  DATE OF BIRTH:  [birth year]  SUBJECTIVE:  Hospital Day: 1 day Ellenie Weiss is a 82 y.o. female presenting with Hypoglycemia .   Overnight events: Transferred to intensive care yesterday Interval Events: doing well today, no complaints, states peeing a lot   REVIEW OF SYSTEMS:  CONSTITUTIONAL: No fever, fatigue or weakness.  EYES: No blurred or double vision.  EARS, NOSE, AND THROAT: No tinnitus or ear pain.  RESPIRATORY: No cough, shortness of breath, wheezing or hemoptysis.  CARDIOVASCULAR: No chest pain, orthopnea, edema.  GASTROINTESTINAL: No nausea, vomiting, diarrhea or abdominal pain.  GENITOURINARY: No dysuria, hematuria.  ENDOCRINE: No polyuria, nocturia,  HEMATOLOGY: No anemia, easy bruising or bleeding SKIN: No rash or lesion. MUSCULOSKELETAL: No joint pain or arthritis.   NEUROLOGIC: No tingling, numbness, weakness.  PSYCHIATRY: No anxiety or depression.   DRUG ALLERGIES:   Allergies  Allergen Reactions  . Ciprocinonide [Fluocinolone]     Feels crazy  . Darvocet [Propoxyphene N-Acetaminophen]     Difficulty breathing and swallowing  . Duratuss G [Guaifenesin]     Other reaction(s): Unknown  . Ivp Dye [Iodinated Diagnostic Agents]     Nausea & vomiting  . Ketek  [Telithromycin]     Other reaction(s): Unknown  . Levaquin  [Levofloxacin] Nausea Only  . Other     Reports all cholesterol medication  . Statins     Muscle aches    VITALS:  Blood pressure (!) 146/95, pulse 95, temperature 97.7 F (36.5 C), temperature source Oral, resp. rate (!) 22, height 5\' 4"  (1.626 m), weight 212 lb 1.3 oz (96.2 kg), SpO2 97 %.  PHYSICAL EXAMINATION:  VITAL SIGNS: Vitals:   04/29/16 1029 04/29/16 1100  BP: (!) 146/95   Pulse: 96 95  Resp:  (!) 22  Temp:     GENERAL:81 y.o.female currently in no acute distress.  HEAD: Normocephalic, atraumatic.   EYES: Pupils equal, round, reactive to light. Extraocular muscles intact. No scleral icterus.  MOUTH: Moist mucosal membrane. Dentition intact. No abscess noted.  EAR, NOSE, THROAT: Clear without exudates. No external lesions.  NECK: Supple. No thyromegaly. No nodules. No JVD.  PULMONARY: Clear to ascultation, without wheeze rails or rhonci. No use of accessory muscles, Good respiratory effort. good air entry bilaterally CHEST: Nontender to palpation.  CARDIOVASCULAR: S1 and S2. Regular rate and rhythm. No murmurs, rubs, or gallops. No edema. Pedal pulses 2+ bilaterally.  GASTROINTESTINAL: Soft, nontender, nondistended. No masses. Positive bowel sounds. No hepatosplenomegaly.  MUSCULOSKELETAL: No swelling, clubbing, or edema. Range of motion full in all extremities.  NEUROLOGIC: Cranial nerves II through XII are intact. No gross focal neurological deficits. Sensation intact. Reflexes intact.  SKIN: No ulceration, lesions, rashes, or cyanosis. Skin warm and dry. Turgor intact.  PSYCHIATRIC: Mood, affect within normal limits. The patient is awake, alert and oriented x 3. Insight, judgment intact.      LABORATORY PANEL:   CBC  Recent Labs Lab 04/29/16 0421  WBC 10.0  HGB 8.4*  HCT 24.1*  PLT 194   ------------------------------------------------------------------------------------------------------------------  Chemistries   Recent Labs Lab 04/27/16 2306  04/29/16 0421  NA 131*  < > 136  K 3.7  < > 3.9  CL 93*  < > 102  CO2 26  < > 28  GLUCOSE 121*  < > 87  BUN 55*  < > 54*  CREATININE 4.37*  < >  4.31*  CALCIUM 7.6*  < > 7.4*  AST 24  --   --   ALT 11*  --   --   ALKPHOS 90  --   --   BILITOT 0.3  --   --   < > = values in this interval not displayed. ------------------------------------------------------------------------------------------------------------------  Cardiac Enzymes  Recent Labs Lab 04/27/16 2306  TROPONINI 0.05*    ------------------------------------------------------------------------------------------------------------------  RADIOLOGY:  Dg Chest 1 View  Result Date: 04/28/2016 CLINICAL DATA:  Nausea. EXAM: CHEST 1 VIEW COMPARISON:  04/27/2016 FINDINGS: Postsurgical changes from CABG stable. The cardiac silhouette is mildly enlarged. Mediastinal contours appear intact. There is no evidence of focal airspace consolidation, pleural effusion or pneumothorax. There is coarsening of the interstitial markings, not noted on the comparison radiograph, likely representing development of interstitial pulmonary edema. Osseous structures are without acute abnormality. Soft tissues are grossly normal. IMPRESSION: Interval development of coarsening of the interstitial markings, likely representing development of interstitial pulmonary edema. Mild cardiomegaly. Electronically Signed   By: Fidela Salisbury M.D.   On: 04/28/2016 17:34  Dg Abd 1 View  Result Date: 04/29/2016 CLINICAL DATA:  Ileus EXAM: ABDOMEN - 1 VIEW COMPARISON:  April 28, 2016 FINDINGS: Fecal loading is seen in the right colon. Gas is seen in the colon to the level of the rectum. Other air-filled loops of bowel are not dilated. No other interval changes. IMPRESSION: 1. Fecal loading in the right colon. 2. No evidence of obstruction. No dilated loops of bowel to suggest obstruction or ileus. Electronically Signed   By: Dorise Bullion III M.D   On: 04/29/2016 07:15  Dg Abd 1 View  Result Date: 04/28/2016 CLINICAL DATA:  Abdominal distention. EXAM: ABDOMEN - 1 VIEW COMPARISON:  None. FINDINGS: Multiple gas-filled nondilated small bowel loops are seen within the abdomen or pelvis. There is also colonic gas and stool to the level the rectum, without evidence of colonic dilatation. No radiopaque calculi identified. IMPRESSION: Nonspecific, nonobstructive bowel gas pattern. Mild ileus cannot be excluded. Electronically Signed   By: Earle Gell M.D.   On:  04/28/2016 12:41  Dg Chest Port 1 View  Result Date: 04/27/2016 CLINICAL DATA:  Recent episode of hypoglycemia EXAM: PORTABLE CHEST 1 VIEW COMPARISON:  07/21/2015 FINDINGS: Cardiac shadow remains enlarged. Postsurgical changes are again seen. The lungs are clear bilaterally. No bony abnormality is seen. IMPRESSION: No acute abnormality noted. Electronically Signed   By: Inez Catalina M.D.   On: 04/27/2016 23:29   EKG:   Orders placed or performed during the hospital encounter of 04/27/16  . ED EKG  . ED EKG  . EKG 12-Lead  . EKG 12-Lead    ASSESSMENT AND PLAN:   [name] is a 82 y.o. female presenting with Hypoglycemia . Admitted 04/27/2016 : Day #: 1 day  1. Hypoglycemia related to type 2 diabetes: Patient appears be on oral agents only but given kidney injury she still has issues. Advance diet, continue dextrose infusion, continue Accu-Chek if blood glucose remains greater than 100 for 2 consecutive readings can space Accu-Cheks to every 4 hours 2. Acute kidney injury in setting of chronic kidney disease stage III continue IV fluids avoid further nephrotoxic agents if no improvement consult nephrology, given patient's increased urination suspect diuresis phase of ATN 3. Urinary tract infection site unspecified follow culture data continue ceftriaxone total duration 3 days 4. Hypertension essential: Klonopin metoprolol hydralazine  Disposition: Transfer out of intensive care unit   All the records are reviewed and  case discussed with Care Management/Social Workerr. Management plans discussed with the patient, family and they are in agreement.  CODE STATUS: full TOTAL TIME TAKING CARE OF THIS PATIENT: 28 minutes.   POSSIBLE D/C IN 2-3DAYS, DEPENDING ON CLINICAL CONDITION.   Carl Butner,  Karenann Cai.D on 04/29/2016 at 12:03 PM  Between 7am to 6pm - Pager - [phone]  After 6pm: House Pager: - [phone]  [facility]  Office  [phone]  CC: Primary  care physician; Idelle Crouch, MD

## 2016-04-29 NOTE — Progress Notes (Signed)
Spoke with MD about pt complaining of pain in her bladder related to "straining," bladder scan pt > 278 ml. Has been having frequency and urgency. MD to put in order for bladder spasms.

## 2016-04-29 NOTE — Progress Notes (Signed)
Per Dr. Lavetta Nielsen, CBG can be changed to q 4hrs and if CBG is greater than 100 X2 can change to q 6hrs.  Clarise Cruz, RN

## 2016-04-30 ENCOUNTER — Inpatient Hospital Stay: Payer: Medicare Other

## 2016-04-30 LAB — BASIC METABOLIC PANEL
Anion gap: 9 (ref 5–15)
BUN: 56 mg/dL — ABNORMAL HIGH (ref 6–20)
CO2: 25 mmol/L (ref 22–32)
Calcium: 7.5 mg/dL — ABNORMAL LOW (ref 8.9–10.3)
Chloride: 100 mmol/L — ABNORMAL LOW (ref 101–111)
Creatinine, Ser: 4.34 mg/dL — ABNORMAL HIGH (ref 0.44–1.00)
GFR calc Af Amer: 10 mL/min — ABNORMAL LOW (ref >60)
GFR calc non Af Amer: 9 mL/min — ABNORMAL LOW (ref >60)
Glucose, Bld: 137 mg/dL — ABNORMAL HIGH (ref 65–99)
Potassium: 3.8 mmol/L (ref 3.5–5.1)
Sodium: 134 mmol/L — ABNORMAL LOW (ref 135–145)

## 2016-04-30 LAB — GLUCOSE, CAPILLARY
Glucose-Capillary: 138 mg/dL — ABNORMAL HIGH (ref 65–99)
Glucose-Capillary: 135 mg/dL — ABNORMAL HIGH (ref 65–99)
Glucose-Capillary: 199 mg/dL — ABNORMAL HIGH (ref 65–99)
Glucose-Capillary: 142 mg/dL — ABNORMAL HIGH (ref 65–99)
Glucose-Capillary: 135 mg/dL — ABNORMAL HIGH (ref 65–99)

## 2016-04-30 LAB — PROTEIN / CREATININE RATIO, URINE
Creatinine, Urine: 52 mg/dL
Protein Creatinine Ratio: 1.54 mg/mg{Cre} — ABNORMAL HIGH (ref 0.00–0.15)
Total Protein, Urine: 80 mg/dL

## 2016-04-30 MED ORDER — ALUM & MAG HYDROXIDE-SIMETH 200-200-20 MG/5ML PO SUSP
30.0000 mL | ORAL | Status: DC | PRN
  Administered 2016-04-30: 16:00:00 30 mL via ORAL
  Filled 2016-04-30: qty 30

## 2016-04-30 NOTE — Progress Notes (Signed)
[facility] NAME: [name]    MRN#:  [id number]  DATE OF BIRTH:  21-Apr-1934  SUBJECTIVE:  Hospital Day: 2 days [name] is a 82 y.o. female presenting with Hypoglycemia .   Overnight events: Now out of the ICU Interval Events: doing well today, no complaints  REVIEW OF SYSTEMS:  CONSTITUTIONAL: No fever, fatigue or weakness.  EYES: No blurred or double vision.  EARS, NOSE, AND THROAT: No tinnitus or ear pain.  RESPIRATORY: No cough, shortness of breath, wheezing or hemoptysis.  CARDIOVASCULAR: No chest pain, orthopnea, edema.  GASTROINTESTINAL: No nausea, vomiting, diarrhea or abdominal pain.  GENITOURINARY: No dysuria, hematuria.  ENDOCRINE: No polyuria, nocturia,  HEMATOLOGY: No anemia, easy bruising or bleeding SKIN: No rash or lesion. MUSCULOSKELETAL: No joint pain or arthritis.   NEUROLOGIC: No tingling, numbness, weakness.  PSYCHIATRY: No anxiety or depression.   DRUG ALLERGIES:   Allergies  Allergen Reactions  . Ciprocinonide [Fluocinolone]     Feels crazy  . Darvocet [Propoxyphene N-Acetaminophen]     Difficulty breathing and swallowing  . Duratuss G [Guaifenesin]     Other reaction(s): Unknown  . Ivp Dye [Iodinated Diagnostic Agents]     Nausea & vomiting  . Ketek  [Telithromycin]     Other reaction(s): Unknown  . Levaquin  [Levofloxacin] Nausea Only  . Other     Reports all cholesterol medication  . Statins     Muscle aches    VITALS:  Blood pressure (!) 155/64, pulse 86, temperature 97.9 F (36.6 C), temperature source Oral, resp. rate 20, height 5\' 4"  (1.626 m), weight 222 lb 2 oz (100.8 kg), SpO2 94 %.  PHYSICAL EXAMINATION:  VITAL SIGNS: Vitals:   04/29/16 2113 04/30/16 0559  BP: (!) 147/63 (!) 155/64  Pulse: 91 86  Resp: 20 20  Temp: 98.1 F (36.7 C) 97.9 F (36.6 C)   GENERAL:81 y.o.female currently in no acute distress.  HEAD: Normocephalic, atraumatic.  EYES:  Pupils equal, round, reactive to light. Extraocular muscles intact. No scleral icterus.  MOUTH: Moist mucosal membrane. Dentition intact. No abscess noted.  EAR, NOSE, THROAT: Clear without exudates. No external lesions.  NECK: Supple. No thyromegaly. No nodules. No JVD.  PULMONARY: Clear to ascultation, without wheeze rails or rhonci. No use of accessory muscles, Good respiratory effort. good air entry bilaterally CHEST: Nontender to palpation.  CARDIOVASCULAR: S1 and S2. Regular rate and rhythm. No murmurs, rubs, or gallops. No edema. Pedal pulses 2+ bilaterally.  GASTROINTESTINAL: Soft, nontender, nondistended. No masses. Positive bowel sounds. No hepatosplenomegaly.  MUSCULOSKELETAL: No swelling, clubbing, or edema. Range of motion full in all extremities.  NEUROLOGIC: Cranial nerves II through XII are intact. No gross focal neurological deficits. Sensation intact. Reflexes intact.  SKIN: No ulceration, lesions, rashes, or cyanosis. Skin warm and dry. Turgor intact.  PSYCHIATRIC: Mood, affect within normal limits. The patient is awake, alert and oriented x 3. Insight, judgment intact.      LABORATORY PANEL:   CBC  Recent Labs Lab 04/29/16 0421  WBC 10.0  HGB 8.4*  HCT 24.1*  PLT 194   ------------------------------------------------------------------------------------------------------------------  Chemistries   Recent Labs Lab 04/27/16 2306  04/30/16 0502  NA 131*  < > 134*  K 3.7  < > 3.8  CL 93*  < > 100*  CO2 26  < > 25  GLUCOSE 121*  < > 137*  BUN 55*  < > 56*  CREATININE 4.37*  < >  4.34*  CALCIUM 7.6*  < > 7.5*  AST 24  --   --   ALT 11*  --   --   ALKPHOS 90  --   --   BILITOT 0.3  --   --   < > = values in this interval not displayed. ------------------------------------------------------------------------------------------------------------------  Cardiac Enzymes  Recent Labs Lab 04/27/16 2306  TROPONINI 0.05*    ------------------------------------------------------------------------------------------------------------------  RADIOLOGY:  Dg Chest 1 View  Result Date: 04/28/2016 CLINICAL DATA:  Nausea. EXAM: CHEST 1 VIEW COMPARISON:  04/27/2016 FINDINGS: Postsurgical changes from CABG stable. The cardiac silhouette is mildly enlarged. Mediastinal contours appear intact. There is no evidence of focal airspace consolidation, pleural effusion or pneumothorax. There is coarsening of the interstitial markings, not noted on the comparison radiograph, likely representing development of interstitial pulmonary edema. Osseous structures are without acute abnormality. Soft tissues are grossly normal. IMPRESSION: Interval development of coarsening of the interstitial markings, likely representing development of interstitial pulmonary edema. Mild cardiomegaly. Electronically Signed   By: Fidela Salisbury M.D.   On: 04/28/2016 17:34  Dg Abd 1 View  Result Date: 04/29/2016 CLINICAL DATA:  Ileus EXAM: ABDOMEN - 1 VIEW COMPARISON:  April 28, 2016 FINDINGS: Fecal loading is seen in the right colon. Gas is seen in the colon to the level of the rectum. Other air-filled loops of bowel are not dilated. No other interval changes. IMPRESSION: 1. Fecal loading in the right colon. 2. No evidence of obstruction. No dilated loops of bowel to suggest obstruction or ileus. Electronically Signed   By: Dorise Bullion III M.D   On: 04/29/2016 07:15   EKG:   Orders placed or performed during the hospital encounter of 04/27/16  . ED EKG  . ED EKG  . EKG 12-Lead  . EKG 12-Lead    ASSESSMENT AND PLAN:   [name] is a 82 y.o. female presenting with Hypoglycemia . Admitted 04/27/2016 : Day #: 2 days  1. Hypoglycemia related to type 2 diabetes:  able to space out accuchecks yesterday, remains on D10 - if upward trend on glucose continuously >150 will stop dextrose infusion 2. Acute kidney injury in setting of chronic kidney  disease stage III continue IV fluids avoid further nephrotoxic agents, consult nephrology, given patient's increased urination suspect diuresis phase of ATN 3. Urinary tract infection site unspecified follow culture data continue ceftriaxone total duration 3 days 4. Hypertension essential: Klonopin metoprolol hydralazine     All the records are reviewed and case discussed with Care Management/Social Workerr. Management plans discussed with the patient, family and they are in agreement.  CODE STATUS: full TOTAL TIME TAKING CARE OF THIS PATIENT: 28 minutes.   POSSIBLE D/C IN 2-3DAYS, DEPENDING ON CLINICAL CONDITION.   Hower,  Karenann Cai.D on 04/30/2016 at 11:43 AM  Between 7am to 6pm - Pager - [phone]  After 6pm: House Pager: - [phone]  [facility]  Office  [phone]  CC: Primary care physician; Anthonette Legato, MD

## 2016-04-30 NOTE — Consult Note (Addendum)
[facility] CONSULT NOTE    Date: 04/30/2016                  Patient Name:  [name]  MRN: [id number]  DOB: [birth year]  Age / Sex: 82 y.o., female         PCP: Anthonette Legato, MD                 Service Requesting Consult: Hospitalist                 Reason for Consult: Acute renal failure            History of Present Illness: Patient is a 82 y.o. female with a PMHx of Osteoarthritis, right breast cancer status post radiation therapy, coronary artery disease status post four-vessel CABG, diabetes mellitus type 2, GERD, hyperlipidemia, hypertension, hypothyroidism who was admitted to [facility] on 04/27/2016 for evaluation of near syncope. Patient reports that she slid off her couch. Husband subsequently called EMS and she was found to have a low blood sugar of 45 per her report. She is on oral anti-glycemic and did not have much to eat on the day of admission. In addition she's had recent worsening renal function as an outpatient as noted by her primary care doctor. She was advised to increase her fluid intake. She denies ingestion of NSAIDs. She also denies vomiting or diarrhea or any other source of volume loss. Lasix is listed on her medication regimen however she has not been taking this for several months.   Medications: Outpatient medications: Prescriptions Prior to Admission  Medication Sig Dispense Refill Last Dose  . ALPRAZolam (XANAX) 0.5 MG tablet Take 0.5 mg by mouth 2 (two) times daily as needed for anxiety.   prn at prn  . aspirin 325 MG EC tablet Take 325 mg by mouth daily.   unknown at unknown  . Calcium Carbonate-Vitamin D (CALCIUM 500 + D PO) Take 1 tablet by mouth daily.   unknown at unknown  . cloNIDine (CATAPRES) 0.2 MG tablet Take 1 tablet (0.2 mg total) by mouth 3 (three) times daily. 90 tablet 11 unknown at unknown  . cyanocobalamin (,VITAMIN B-12,) 1000 MCG/ML injection Inject 1,000 mcg into the muscle every 30 (thirty) days.    unknown at unknown  . furosemide (LASIX) 40 MG tablet Take 1 tablet (40 mg total) by mouth daily as needed. 90 tablet 3 prn at prn  . gabapentin (NEURONTIN) 100 MG capsule Take 100 mg by mouth 2 (two) times daily.   unknown at unknown  . glimepiride (AMARYL) 4 MG tablet Take 4 mg by mouth 2 (two) times daily.   unknown at unknown  . hydrALAZINE (APRESOLINE) 100 MG tablet Take 1 tablet (100 mg total) by mouth 4 (four) times daily. 120 tablet 7 unknown at unknown  . HYDROcodone-acetaminophen (NORCO/VICODIN) 5-325 MG tablet Take 1-2 tablets by mouth every 4 (four) hours as needed for moderate pain. 30 tablet 0 prn at prn  . levothyroxine (SYNTHROID, LEVOTHROID) 175 MCG tablet Take 175 mcg by mouth daily before breakfast.   unknown at unknown  . metoprolol (LOPRESSOR) 50 MG tablet Take 1 tablet (50 mg total) by mouth every 8 (eight) hours. 90 tablet 5 unknown at unknown  . Omega-3 Fatty Acids (FISH OIL) 1000 MG CAPS Take 1,000 mg by mouth daily.    unknown at unknown    Current medications: Current Facility-Administered Medications  Medication Dose Route Frequency Provider Last Rate Last Dose  .  0.9 %  sodium chloride infusion   Intravenous Continuous Loletha Grayer, MD 50 mL/hr at 04/30/16 0511    . acetaminophen (TYLENOL) tablet 650 mg  650 mg Oral Q6H PRN Harrie Foreman, MD       Or  . acetaminophen (TYLENOL) suppository 650 mg  650 mg Rectal Q6H PRN Harrie Foreman, MD      . aspirin EC tablet 325 mg  325 mg Oral Daily Harrie Foreman, MD   325 mg at 04/30/16 0955  . cefTRIAXone (ROCEPHIN) 1 g in dextrose 5 % 50 mL IVPB  1 g Intravenous Q24H Loletha Grayer, MD   1 g at 04/30/16 0955  . cloNIDine (CATAPRES) tablet 0.2 mg  0.2 mg Oral TID Harrie Foreman, MD   0.2 mg at 04/30/16 0955  . cyanocobalamin ((VITAMIN B-12)) injection 1,000 mcg  1,000 mcg Intramuscular Q30 days Harrie Foreman, MD   1,000 mcg at 04/28/16 0945  . docusate sodium (COLACE) capsule 100 mg  100 mg Oral BID  Harrie Foreman, MD   100 mg at 04/30/16 0956  . gabapentin (NEURONTIN) capsule 100 mg  100 mg Oral BID Harrie Foreman, MD   100 mg at 04/30/16 0955  . heparin injection 5,000 Units  5,000 Units Subcutaneous Q8H Harrie Foreman, MD   5,000 Units at 04/29/16 2052  . hydrALAZINE (APRESOLINE) injection 10 mg  10 mg Intravenous Q6H PRN Henreitta Leber, MD      . hydrALAZINE (APRESOLINE) tablet 100 mg  100 mg Oral QID Harrie Foreman, MD   100 mg at 04/30/16 0956  . HYDROcodone-acetaminophen (NORCO/VICODIN) 5-325 MG per tablet 1-2 tablet  1-2 tablet Oral Q4H PRN Harrie Foreman, MD   1 tablet at 04/29/16 7543  . ipratropium-albuterol (DUONEB) 0.5-2.5 (3) MG/3ML nebulizer solution 3 mL  3 mL Nebulization Once Paulette Blanch, MD      . ipratropium-albuterol (DUONEB) 0.5-2.5 (3) MG/3ML nebulizer solution 3 mL  3 mL Nebulization Q6H PRN Loletha Grayer, MD      . labetalol (NORMODYNE,TRANDATE) injection 10 mg  10 mg Intravenous Q2H PRN Henreitta Leber, MD   10 mg at 04/28/16 1733  . levothyroxine (SYNTHROID, LEVOTHROID) tablet 150 mcg  150 mcg Oral QAC breakfast Harrie Foreman, MD   150 mcg at 04/30/16 [phone]  . metoprolol (LOPRESSOR) tablet 50 mg  50 mg Oral Q8H Harrie Foreman, MD   50 mg at 04/30/16 0956  . omega-3 acid ethyl esters (LOVAZA) capsule 1 g  1 g Oral Daily Harrie Foreman, MD   1 g at 04/30/16 0955  . ondansetron (ZOFRAN) injection 4 mg  4 mg Intravenous Q6H PRN Harrie Foreman, MD   4 mg at 04/30/16 7034  . ondansetron (ZOFRAN) tablet 4 mg  4 mg Oral Q4H PRN Harrie Foreman, MD      . promethazine (PHENERGAN) injection 12.5 mg  12.5 mg Intravenous Q6H PRN Srikar Sudini, MD      . promethazine (PHENERGAN) injection 12.5 mg  12.5 mg Intravenous Q6H PRN Henreitta Leber, MD   12.5 mg at 04/28/16 1456  . sodium chloride 0.9 % bolus 1,000 mL  1,000 mL Intravenous Once Harrie Foreman, MD      . zolpidem [facility]) tablet 5 mg  5 mg Oral QHS PRN Harrie Foreman, MD           Allergies: Allergies  Allergen Reactions  . Ciprocinonide [Fluocinolone]  Feels crazy  . Darvocet [Propoxyphene N-Acetaminophen]     Difficulty breathing and swallowing  . Duratuss G [Guaifenesin]     Other reaction(s): Unknown  . Ivp Dye [Iodinated Diagnostic Agents]     Nausea & vomiting  . Ketek  [Telithromycin]     Other reaction(s): Unknown  . Levaquin  [Levofloxacin] Nausea Only  . Other     Reports all cholesterol medication  . Statins     Muscle aches      Past Medical History: Past Medical History:  Diagnosis Date  . Angina pectoris ([place]) 08/26/2012  . Arthritis   . Breast mass, right   . Cancer [facility])    breast cancer, right side 2012  . Complication of anesthesia   . Coronary artery disease 08/22/2012   sees Dr Rockey Situ  . Diabetes ([place])   . Gastritis    hx of  . GERD (gastroesophageal reflux disease)   . Headache(784.0)    migraines  . History of breast cancer    39 treatments of radiation. Negative chemo.  Marland Kitchen History of seasonal allergies   . Hyperlipemia   . Hypertension    sees Dr. Fulton Reek  . Pneumonia    hx of  . PONV (postoperative nausea and vomiting)   . 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 Stroke ([place])   . Thyroid disease   . Vertigo      Past Surgical History: Past Surgical History:  Procedure Laterality Date  . ABDOMINAL HYSTERECTOMY    . APPENDECTOMY    . BACK SURGERY    . BREAST BIOPSY Right    2012 positive  . BREAST EXCISIONAL BIOPSY Right    2012 positive  . BREAST SURGERY    . CARDIAC CATHETERIZATION  2014  . CORONARY ARTERY BYPASS GRAFT  09/05/2012   Procedure: CORONARY ARTERY BYPASS GRAFTING (CABG);  Surgeon: Rexene Alberts, MD;  Location: [place];  Service: Open Heart Surgery;  Laterality: N/A;  CABG x four, using left internal mammary artery and bilateral greater saphenous vein harvested endoscopically  . INTRAOPERATIVE TRANSESOPHAGEAL  ECHOCARDIOGRAM  09/05/2012   Procedure: INTRAOPERATIVE TRANSESOPHAGEAL ECHOCARDIOGRAM;  Surgeon: Rexene Alberts, MD;  Location: [place];  Service: Open Heart Surgery;  Laterality: N/A;  . LAPAROSCOPIC NISSEN FUNDOPLICATION    . OVARIAN CYST REMOVAL       Family History: Family History  Problem Relation Age of Onset  . Heart disease Brother     CABG & stents  . Hyperlipidemia Brother   . Hypertension Brother   . Cancer Father   . Heart disease Mother   . Breast cancer Neg Hx      Social History: Social History   Social History  . Marital status: Married    Spouse name: N/A  . Number of children: 0  . Years of education: N/A   Occupational History  . retired    Social History Main Topics  . Smoking status: Former Smoker    Types: Cigarettes    Quit date: 11/13/1988  . Smokeless tobacco: Never Used  . Alcohol use No  . Drug use: No  . Sexual activity: Not on file   Other Topics Concern  . Not on file   Social History Narrative  . No narrative on file     Review of Systems: Review of Systems  Constitutional: Negative for chills, fever and weight loss.  HENT: Negative for hearing loss and tinnitus.   Eyes: Negative for blurred vision and double vision.  Respiratory: Negative for cough, hemoptysis and sputum production.   Cardiovascular: Negative for chest pain, palpitations and orthopnea.  Gastrointestinal: Negative for diarrhea, heartburn, nausea and vomiting.  Genitourinary: Negative for dysuria, frequency and urgency.  Musculoskeletal: Negative for myalgias.  Skin: Negative for itching and rash.  Neurological: Positive for weakness. Negative for dizziness and headaches.  Endo/Heme/Allergies: Negative for polydipsia.  Psychiatric/Behavioral: Positive for hallucinations. The patient is not nervous/anxious.      Vital Signs: Blood pressure (!) 155/64, pulse 86, temperature 97.9 F (36.6 C), temperature source Oral, resp. rate 20, height 5' 4"  (1.626 m),  weight 100.8 kg (222 lb 2 oz), SpO2 94 %.  Weight trends: Filed Weights   04/29/16 0448 04/29/16 1421 04/30/16 0627  Weight: 96.2 kg (212 lb 1.3 oz) 103.4 kg (227 lb 14.4 oz) 100.8 kg (222 lb 2 oz)    Physical Exam: General: NAD, sitting up in bed  Head: Normocephalic, atraumatic.  Eyes: Anicteric, EOMI  Nose: Mucous membranes dry, not inflammed, nonerythematous.  Throat: Oropharynx nonerythematous, no exudate appreciated.   Neck: Supple, trachea midline.  Lungs:  Normal respiratory effort. Clear to auscultation BL without crackles or wheezes.  Heart: RRR. S1 and S2 normal without gallop, murmur, or rubs.  Abdomen:  BS normoactive. Soft, Nondistended, non-tender.  No masses or organomegaly.  Extremities: No pretibial edema.  Neurologic: A&O X3, Motor strength is 5/5 in the all 4 extremities  Skin: No visible rashes, scars.    Lab results: Basic Metabolic Panel:  Recent Labs Lab 04/28/16 0440 04/29/16 0421 04/30/16 0502  NA 134* 136 134*  K 2.8* 3.9 3.8  CL 97* 102 100*  CO2 26 28 25   GLUCOSE 27* 87 137*  BUN 59* 54* 56*  CREATININE 4.48* 4.31* 4.34*  CALCIUM 7.7* 7.4* 7.5*    Liver Function Tests:  Recent Labs Lab 04/27/16 2306  AST 24  ALT 11*  ALKPHOS 90  BILITOT 0.3  PROT 7.8  ALBUMIN 2.6*   No results for input(s): LIPASE, AMYLASE in the last 168 hours. No results for input(s): AMMONIA in the last 168 hours.  CBC:  Recent Labs Lab 04/27/16 2306 04/29/16 0421  WBC 12.3* 10.0  NEUTROABS 11.4*  --   HGB 8.9* 8.4*  HCT 26.6* 24.1*  MCV 83.6 82.5  PLT 228 194    Cardiac Enzymes:  Recent Labs Lab 04/27/16 2306  TROPONINI 0.05*    BNP: Invalid input(s): POCBNP  CBG:  Recent Labs Lab 04/29/16 1653 04/29/16 2110 04/30/16 0222 04/30/16 0724 04/30/16 1204  GLUCAP 87 159* 138* 135* 199*    Microbiology: Results for orders placed or performed during the hospital encounter of 04/27/16  Urine culture     Status: Abnormal    Collection Time: 04/27/16 11:06 PM  Result Value Ref Range Status   Specimen Description URINE, CLEAN CATCH  Final   Special Requests Normal  Final   Culture (A)  Final    <10,000 COLONIES/mL INSIGNIFICANT GROWTH Performed at [facility]    Report Status 04/29/2016 FINAL  Final  MRSA PCR Screening     Status: None   Collection Time: 04/28/16  7:00 PM  Result Value Ref Range Status   MRSA by PCR NEGATIVE NEGATIVE Final    Comment:        The GeneXpert MRSA Assay (FDA approved for NASAL specimens only), is one component of a comprehensive MRSA colonization  surveillance program. It is not intended to diagnose MRSA infection nor to guide or monitor treatment for MRSA infections.     Coagulation Studies: No results for input(s): LABPROT, INR in the last 72 hours.  Urinalysis:  Recent Labs  04/27/16 2306  COLORURINE YELLOW*  LABSPEC 1.010  PHURINE 5.0  GLUCOSEU NEGATIVE  HGBUR 3+*  BILIRUBINUR NEGATIVE  KETONESUR NEGATIVE  PROTEINUR 100*  NITRITE NEGATIVE  LEUKOCYTESUR 1+*      Imaging: Dg Chest 1 View  Result Date: 04/28/2016 CLINICAL DATA:  Nausea. EXAM: CHEST 1 VIEW COMPARISON:  04/27/2016 FINDINGS: Postsurgical changes from CABG stable. The cardiac silhouette is mildly enlarged. Mediastinal contours appear intact. There is no evidence of focal airspace consolidation, pleural effusion or pneumothorax. There is coarsening of the interstitial markings, not noted on the comparison radiograph, likely representing development of interstitial pulmonary edema. Osseous structures are without acute abnormality. Soft tissues are grossly normal. IMPRESSION: Interval development of coarsening of the interstitial markings, likely representing development of interstitial pulmonary edema. Mild cardiomegaly. Electronically Signed   By: Fidela Salisbury M.D.   On: 04/28/2016 17:34  Dg Abd 1 View  Result Date: 04/29/2016 CLINICAL DATA:  Ileus EXAM: ABDOMEN - 1 VIEW  COMPARISON:  April 28, 2016 FINDINGS: Fecal loading is seen in the right colon. Gas is seen in the colon to the level of the rectum. Other air-filled loops of bowel are not dilated. No other interval changes. IMPRESSION: 1. Fecal loading in the right colon. 2. No evidence of obstruction. No dilated loops of bowel to suggest obstruction or ileus. Electronically Signed   By: Dorise Bullion III M.D   On: 04/29/2016 07:15     Assessment & Plan: Pt is a 82 y.o. female with a PMHx of Osteoarthritis, right breast cancer status post radiation therapy, coronary artery disease status post four-vessel CABG, diabetes mellitus type 2, GERD, hyperlipidemia, hypertension, hypothyroidism who was admitted to [facility] on 04/27/2016 for evaluation of near syncope.   1. Acute renal failure/chronic kidney disease stage IV Baseline creatinine 2.0 EGFR 24/proteinuria. The patient actually had an episode of acute renal failure as an outpatient. On 04/20/2016 creatinine was up to 3.2. The worsening renal function likely led to her hypoglycemia. Continue IV fluid hydration for now. Hopefully renal function will improve however the patient may end up requiring short-term renal replacement therapy if renal function actually worsens. We will proceed with renal ultrasound to make sure there is no underlying obstruction.  Patient was found to have proteinuria as an outpatient which we will further quantify. We will also check SPEP, UPEP, ANA, ANCA, GBM antibodies, C3, C4. Continue to monitor renal function daily.  2. Anemia of chronic kidney disease. Hemoglobin currently 8.4. We will likely need to consider Procrit as an outpatient.  3. She will certainly need outpatient follow-up for her underlying chronic kidney disease stage IV.

## 2016-04-30 NOTE — Progress Notes (Signed)
OT Cancellation Note  Patient Details Name: [name] MRN: [id number] DOB: 09-03-1934   Cancelled Treatment:    Reason Eval/Treat Not Completed: Medical issues which prohibited therapy Order received for OT evaluation and then she was transferred from floor to CCU with unstable lab values and vitals and now back on the floor.  Will complete OccupationalTherapy orders at this time.  New order needed when she is appropriate for therapy.   Chrys Racer, OTR/L ascom [phone]  04/30/2016, 8:54 AM

## 2016-05-01 ENCOUNTER — Inpatient Hospital Stay: Payer: Medicare Other

## 2016-05-01 LAB — BASIC METABOLIC PANEL
Anion gap: 7 (ref 5–15)
BUN: 55 mg/dL — ABNORMAL HIGH (ref 6–20)
CO2: 27 mmol/L (ref 22–32)
Calcium: 7.7 mg/dL — ABNORMAL LOW (ref 8.9–10.3)
Chloride: 101 mmol/L (ref 101–111)
Creatinine, Ser: 4.35 mg/dL — ABNORMAL HIGH (ref 0.44–1.00)
GFR calc Af Amer: 10 mL/min — ABNORMAL LOW (ref >60)
GFR calc non Af Amer: 9 mL/min — ABNORMAL LOW (ref >60)
Glucose, Bld: 127 mg/dL — ABNORMAL HIGH (ref 65–99)
Potassium: 3.9 mmol/L (ref 3.5–5.1)
Sodium: 135 mmol/L (ref 135–145)

## 2016-05-01 LAB — GLUCOSE, CAPILLARY
Glucose-Capillary: 126 mg/dL — ABNORMAL HIGH (ref 65–99)
Glucose-Capillary: 141 mg/dL — ABNORMAL HIGH (ref 65–99)
Glucose-Capillary: 111 mg/dL — ABNORMAL HIGH (ref 65–99)
Glucose-Capillary: 139 mg/dL — ABNORMAL HIGH (ref 65–99)

## 2016-05-01 LAB — C3 COMPLEMENT: C3 Complement: 91 mg/dL (ref 82–167)

## 2016-05-01 LAB — ENA+DNA/DS+ANTICH+CENTRO+JO...
Anti JO-1: <0.2 AI (ref 0.0–0.9)
Centromere Ab Screen: 6.3 AI — ABNORMAL HIGH (ref 0.0–0.9)
Chromatin Ab SerPl-aCnc: >8 AI — ABNORMAL HIGH (ref 0.0–0.9)
ENA SM Ab Ser-aCnc: <0.2 AI (ref 0.0–0.9)
Ribonucleic Protein: 1.1 AI — ABNORMAL HIGH (ref 0.0–0.9)
SSA (Ro) (ENA) Antibody, IgG: <0.2 AI (ref 0.0–0.9)
SSB (La) (ENA) Antibody, IgG: <0.2 AI (ref 0.0–0.9)
Scleroderma (Scl-70) (ENA) Antibody, IgG: <0.2 AI (ref 0.0–0.9)
ds DNA Ab: 13 IU/mL — ABNORMAL HIGH (ref 0–9)

## 2016-05-01 LAB — C4 COMPLEMENT: Complement C4, Body Fluid: 17 mg/dL (ref 14–44)

## 2016-05-01 LAB — VITAMIN D 25 HYDROXY (VIT D DEFICIENCY, FRACTURES): Vit D, 25-Hydroxy: 20 ng/mL — ABNORMAL LOW (ref 30.0–100.0)

## 2016-05-01 LAB — PARATHYROID HORMONE, INTACT (NO CA): PTH: 99 pg/mL — ABNORMAL HIGH (ref 15–65)

## 2016-05-01 LAB — ANA W/REFLEX IF POSITIVE: Anti Nuclear Antibody(ANA): POSITIVE — AB

## 2016-05-01 NOTE — Progress Notes (Signed)
[facility]  ROUNDING NOTE   Subjective:   Husband at bedside.  Foley placed and UOP 1045 however no improvement in creatinine or BUN  Objective:  Vital signs in last 24 hours:  Temp:  [98.1 F (36.7 C)-98.8 F (37.1 C)] 98.2 F (36.8 C) (07/31 0451) Pulse Rate:  [84-87] 86 (07/31 0451) Resp:  [16-20] 16 (07/31 0451) BP: (138-150)/(54-60) 138/59 (07/31 0451) SpO2:  [95 %-99 %] 95 % (07/31 0451) Weight:  [100.5 kg (221 lb 8 oz)] 100.5 kg (221 lb 8 oz) (07/31 0451)  Weight change: -2.903 kg (-6 lb 6.4 oz) Filed Weights   04/29/16 1421 04/30/16 0627 05/01/16 0451  Weight: 103.4 kg (227 lb 14.4 oz) 100.8 kg (222 lb 2 oz) 100.5 kg (221 lb 8 oz)    Intake/Output: I/O last 3 completed shifts: In: 88 [IV Piggyback:50] Out: 2700 [Urine:2700]   Intake/Output this shift:  Total I/O In: 240 [P.O.:240] Out: 525 [Urine:525]  Physical Exam: General: NAD, laying in bed  Head: Normocephalic, atraumatic. Moist oral mucosal membranes  Eyes: Anicteric, PERRL  Neck: Supple, trachea midline  Lungs:  Clear to auscultation  Heart: Regular rate and rhythm  Abdomen:  Soft, nontender,   Extremities: no peripheral edema.  Neurologic: Nonfocal, moving all four extremities  Skin: No lesions  GU:  Foley with urine    Basic Metabolic Panel:  Recent Labs Lab 04/27/16 2306 04/28/16 0440 04/29/16 0421 04/30/16 0502 05/01/16 0745  NA 131* 134* 136 134* 135  K 3.7 2.8* 3.9 3.8 3.9  CL 93* 97* 102 100* 101  CO2 26 26 28 25 27   GLUCOSE 121* 27* 87 137* 127*  BUN 55* 59* 54* 56* 55*  CREATININE 4.37* 4.48* 4.31* 4.34* 4.35*  CALCIUM 7.6* 7.7* 7.4* 7.5* 7.7*    Liver Function Tests:  Recent Labs Lab 04/27/16 2306  AST 24  ALT 11*  ALKPHOS 90  BILITOT 0.3  PROT 7.8  ALBUMIN 2.6*   No results for input(s): LIPASE, AMYLASE in the last 168 hours. No results for input(s): AMMONIA in the last 168 hours.  CBC:  Recent Labs Lab 04/27/16 2306 04/29/16 0421  WBC  12.3* 10.0  NEUTROABS 11.4*  --   HGB 8.9* 8.4*  HCT 26.6* 24.1*  MCV 83.6 82.5  PLT 228 194    Cardiac Enzymes:  Recent Labs Lab 04/27/16 2306  TROPONINI 0.05*    BNP: Invalid input(s): POCBNP  CBG:  Recent Labs Lab 04/30/16 1204 04/30/16 1702 04/30/16 2115 05/01/16 0722 05/01/16 1121  GLUCAP 199* 142* 135* 126* 141*    Microbiology: Results for orders placed or performed during the hospital encounter of 04/27/16  Urine culture     Status: Abnormal   Collection Time: 04/27/16 11:06 PM  Result Value Ref Range Status   Specimen Description URINE, CLEAN CATCH  Final   Special Requests Normal  Final   Culture (A)  Final    <10,000 COLONIES/mL INSIGNIFICANT GROWTH Performed at [facility]    Report Status 04/29/2016 FINAL  Final  MRSA PCR Screening     Status: None   Collection Time: 04/28/16  7:00 PM  Result Value Ref Range Status   MRSA by PCR NEGATIVE NEGATIVE Final    Comment:        The GeneXpert MRSA Assay (FDA approved for NASAL specimens only), is one component of a comprehensive MRSA colonization surveillance program. It is not intended to diagnose MRSA infection nor to guide or monitor treatment for MRSA infections.  Coagulation Studies: No results for input(s): LABPROT, INR in the last 72 hours.  Urinalysis: No results for input(s): COLORURINE, LABSPEC, PHURINE, GLUCOSEU, HGBUR, BILIRUBINUR, KETONESUR, PROTEINUR, UROBILINOGEN, NITRITE, LEUKOCYTESUR in the last 72 hours.  Invalid input(s): APPERANCEUR    Imaging: Dg Chest 1 View  Result Date: 05/01/2016 CLINICAL DATA:  Central chest pain and cough for 2 days EXAM: CHEST 1 VIEW COMPARISON:  04/28/2016 FINDINGS: Cardiac shadow remains enlarged. Postsurgical changes are again noted. There is been some improvement in the degree of interstitial changes consistent with improving edema. No focal infiltrate or sizable effusion is seen. No bony abnormality is noted. IMPRESSION: Improved  interstitial edema when compared with the prior exam. Electronically Signed   By: Inez Catalina M.D.   On: 05/01/2016 11:51  US Renal  Result Date: 04/30/2016 CLINICAL DATA:  Acute renal failure. EXAM: RENAL / URINARY TRACT ULTRASOUND COMPLETE COMPARISON:  None. FINDINGS: Right Kidney: Length: 12.6 cm. Moderate hydronephrosis with increased renal echogenicity identified. There is no evidence of solid renal mass. Left Kidney: Length: 12.8 cm. Moderate hydronephrosis with increased renal echogenicity identified. There is no evidence of solid mass. Bladder: The bladder is distended. IMPRESSION: Bladder distention with moderate bilateral hydronephrosis. Increased bilateral renal echogenicity compatible with medical renal disease. Electronically Signed   By: Margarette Canada M.D.   On: 04/30/2016 15:26    Medications:   . sodium chloride 50 mL/hr at 05/01/16 0705   . aspirin  325 mg Oral Daily  . cloNIDine  0.2 mg Oral TID  . cyanocobalamin  1,000 mcg Intramuscular Q30 days  . docusate sodium  100 mg Oral BID  . gabapentin  100 mg Oral BID  . heparin  5,000 Units Subcutaneous Q8H  . hydrALAZINE  100 mg Oral QID  . ipratropium-albuterol  3 mL Nebulization Once  . levothyroxine  150 mcg Oral QAC breakfast  . metoprolol  50 mg Oral Q8H  . omega-3 acid ethyl esters  1 g Oral Daily  . sodium chloride  1,000 mL Intravenous Once   acetaminophen **OR** acetaminophen, alum & mag hydroxide-simeth, hydrALAZINE, HYDROcodone-acetaminophen, ipratropium-albuterol, labetalol, [DISCONTINUED] ondansetron **OR** ondansetron (ZOFRAN) IV, ondansetron, promethazine, zolpidem  Assessment/ Plan:  [name] is a 82 y.o. white female with osteoarthritis, right breast cancer status post radiation therapy, coronary artery disease status post four-vessel CABG, diabetes mellitus type 2, GERD, hyperlipidemia, hypertension, hypothyroidism who was admitted to [facility] on 04/27/2016 for evaluation of near syncope.   1.  Acute renal failure on chronic kidney disease stage IV with proteinuria:  Baseline creatinine 2.0 EGFR 24 Hydronephrosis on ultrasound. Foley placed. Pending urology consultation.  No real improvement in kidney function this morning.  Serologic studies pending.  No acute indication for dialysis.   2. Hypertension: well controlled. Holding furosemide.  - clonidine, hydralazine and metoprolol.   3. Anemia of chronic kidney disease. Hemoglobin 8.4.  - low threshold to start epo.    LOS: [place], [place] 7/31/201712:14 PM

## 2016-05-01 NOTE — Progress Notes (Signed)
[place] [facility] [place] NAME: [name]    MRN#:  [id number]  DATE OF BIRTH:  1934-04-03  SUBJECTIVE:  Hospital Day: 3 days [name] is a 82 y.o. female presenting with Hypoglycemia .   Overnight events: Hydronephrosis noted yesterday, status post Foley catheter placement Interval Events: Complains of cough denies shortness of breath further symptoms at this time  REVIEW OF SYSTEMS:  CONSTITUTIONAL: No fever, fatigue or weakness.  EYES: No blurred or double vision.  EARS, NOSE, AND THROAT: No tinnitus or ear pain.  RESPIRATORY:Positive cough, denies  shortness of breath, wheezing or hemoptysis.  CARDIOVASCULAR: No chest pain, orthopnea, edema.  GASTROINTESTINAL: No nausea, vomiting, diarrhea or abdominal pain.  GENITOURINARY: No dysuria, hematuria.  ENDOCRINE: No polyuria, nocturia,  HEMATOLOGY: No anemia, easy bruising or bleeding SKIN: No rash or lesion. MUSCULOSKELETAL: No joint pain or arthritis.   NEUROLOGIC: No tingling, numbness, weakness.  PSYCHIATRY: No anxiety or depression.   DRUG ALLERGIES:   Allergies  Allergen Reactions  . Ciprocinonide [Fluocinolone]     Feels crazy  . Darvocet [Propoxyphene N-Acetaminophen]     Difficulty breathing and swallowing  . Duratuss G [Guaifenesin]     Other reaction(s): Unknown  . Ivp Dye [Iodinated Diagnostic Agents]     Nausea & vomiting  . Ketek  [Telithromycin]     Other reaction(s): Unknown  . Levaquin  [Levofloxacin] Nausea Only  . Other     Reports all cholesterol medication  . Statins     Muscle aches    VITALS:  Blood pressure (!) 138/59, pulse 86, temperature 98.2 F (36.8 C), temperature source Oral, resp. rate 16, height 5\' 4"  (1.626 m), weight 221 lb 8 oz (100.5 kg), SpO2 95 %.  PHYSICAL EXAMINATION:  VITAL SIGNS: Vitals:   05/01/16 0125 05/01/16 0451  BP: (!) 149/54 (!) 138/59  Pulse: 84 86  Resp:  16  Temp:  98.2 F (36.8 C)   GENERAL:81  y.o.female currently in no acute distress.  HEAD: Normocephalic, atraumatic.  EYES: Pupils equal, round, reactive to light. Extraocular muscles intact. No scleral icterus.  MOUTH: Moist mucosal membrane. Dentition intact. No abscess noted.  EAR, NOSE, THROAT: Clear without exudates. No external lesions.  NECK: Supple. No thyromegaly. No nodules. No JVD.  PULMONARY:  scant basilar rhonchi  without wheezeNo use of accessory muscles, Good respiratory effort. good air entry bilaterally CHEST: Nontender to palpation.  CARDIOVASCULAR: S1 and S2. Regular rate and rhythm. No murmurs, rubs, or gallops. No edema. Pedal pulses 2+ bilaterally.  GASTROINTESTINAL: Soft, nontender, nondistended. No masses. Positive bowel sounds. No hepatosplenomegaly.  MUSCULOSKELETAL: No swelling, clubbing, or edema. Range of motion full in all extremities.  NEUROLOGIC: Cranial nerves II through XII are intact. No gross focal neurological deficits. Sensation intact. Reflexes intact.  SKIN: No ulceration, lesions, rashes, or cyanosis. Skin warm and dry. Turgor intact.  PSYCHIATRIC: Mood, affect within normal limits. The patient is awake, alert and oriented x 3. Insight, judgment intact.      LABORATORY PANEL:   CBC  Recent Labs Lab 04/29/16 0421  WBC 10.0  HGB 8.4*  HCT 24.1*  PLT 194   ------------------------------------------------------------------------------------------------------------------  Chemistries   Recent Labs Lab 04/27/16 2306  05/01/16 0745  NA 131*  < > 135  K 3.7  < > 3.9  CL 93*  < > 101  CO2 26  < > 27  GLUCOSE 121*  < > 127*  BUN 55*  < > 55*  CREATININE 4.37*  < > 4.35*  CALCIUM 7.6*  < > 7.7*  AST 24  --   --   ALT 11*  --   --   ALKPHOS 90  --   --   BILITOT 0.3  --   --   < > = values in this interval not displayed. ------------------------------------------------------------------------------------------------------------------  Cardiac Enzymes  Recent Labs Lab  04/27/16 2306  TROPONINI 0.05*   ------------------------------------------------------------------------------------------------------------------  RADIOLOGY:  US Renal  Result Date: 04/30/2016 CLINICAL DATA:  Acute renal failure. EXAM: RENAL / URINARY TRACT ULTRASOUND COMPLETE COMPARISON:  None. FINDINGS: Right Kidney: Length: 12.6 cm. Moderate hydronephrosis with increased renal echogenicity identified. There is no evidence of solid renal mass. Left Kidney: Length: 12.8 cm. Moderate hydronephrosis with increased renal echogenicity identified. There is no evidence of solid mass. Bladder: The bladder is distended. IMPRESSION: Bladder distention with moderate bilateral hydronephrosis. Increased bilateral renal echogenicity compatible with medical renal disease. Electronically Signed   By: Margarette Canada M.D.   On: 04/30/2016 15:26   EKG:   Orders placed or performed during the hospital encounter of 04/27/16  . ED EKG  . ED EKG  . EKG 12-Lead  . EKG 12-Lead    ASSESSMENT AND PLAN:   [name] is a 82 y.o. female presenting with Hypoglycemia . Admitted 04/27/2016 : Day #: 3 days  1. Hypoglycemia related to type 2 diabetes: Stopped dextrose infusion yesterday glucose remained stable  2. Acute kidney injury in setting of chronic kidney disease stage III continue IV fluids avoid further nephrotoxic agents, appreciate nephrology input, urology to evaluated patient 3. Urinary tract infection site unspecified follow culture data continue ceftriaxone total duration 3 days-Completed  4. Hypertension essential: metoprolol hydralazine 5. Cough: Given cough with scant rhonchi or check chest x-ray suspect this is likely related to atelectasis regardless addbreathing treatments     All the records are reviewed and case discussed with Care Management/Social Workerr. Management plans discussed with the patient, family and they are in agreement.  CODE STATUS: full TOTAL TIME TAKING CARE OF  THIS PATIENT: 33 minutes.   POSSIBLE D/C IN 2-3DAYS, DEPENDING ON CLINICAL CONDITION.   Hower,  Karenann Cai.D on 05/01/2016 at 11:52 AM  Between 7am to 6pm - Pager - [phone]  After 6pm: House Pager: - [facility]  Office  [phone]  CC: Primary care physician; Anthonette Legato, MD

## 2016-05-01 NOTE — Progress Notes (Signed)
Physical Therapy Evaluation Patient Details Name: [name] MRN: [id number] DOB: [birth year] Today's Date: 05/01/2016   History of Present Illness  Pt is a 82 y/o female admitted for acute kidney injury. Pt reports she was sitting on her couch at home when she passed out and fell on the floor. Cannot remember how it happened. When EMS arrived they found her blood sugar to be 40. Blood sugar and BP have been fluctuating throughout duration of stay in hospital. PMH includes DM, HTN, history of CVA, and CAD s/p CABG.  Clinical Impression  Pt is a pleasant 82 y/o female who presents with generalized weakness and difficulty walking. PLOF: Pt reports she is independent with no use of AD (has RW from previous CVA). Pt independent with household ambulation, no ambulation in community. Pt is min guard with bed mobility, and min assist for transfers and ambulation. Pt requires verbal and tactile cueing to initiate bed mobility, requires B UE support to maintain sitting balance. Pt unsteady with gait without use of RW. Pt demonstrates increased stability with use of RW. Pt demonstrates step-through gait pattern with increased lateral sway to L. Pt O2 sats maintained at 91%-95% throughout ambulation. Pt will benefit from skilled PT in order to improve strength, balance, functional mobility, and safety with DME use. At this time pt is appropriate for HHPT to achieve goals.     Follow Up Recommendations Home health PT    Equipment Recommendations       Recommendations for Other Services       Precautions / Restrictions Precautions Precautions: Fall Restrictions Weight Bearing Restrictions: No      Mobility  Bed Mobility Overal bed mobility: Needs Assistance Bed Mobility: Supine to Sit     Supine to sit: Min guard     General bed mobility comments: Pt requires verbal and tactile cueing to use B UE to push through bed to assist in coming to sitting. Pt also requires verbal cueing to scoot  to EOB.  Transfers Overall transfer level: Needs assistance Equipment used: 1 person hand held assist Transfers: Sit to/from Stand Sit to Stand: Min assist         General transfer comment: Pt pushes through bed and PT HHA to transfer from sit/stand. Pt appears unsteady upon standing but is able to steady herself, no LOB noted. No symptoms of dizziness noted.   Ambulation/Gait Ambulation/Gait assistance: Min assist Ambulation Distance (Feet): 200 Feet Assistive device: Rolling walker (2 wheeled) Gait Pattern/deviations: Step-through pattern;Decreased stride length;Trunk flexed Gait velocity: 14 ft/sec Gait velocity interpretation: Below normal speed for age/gender General Gait Details: Pt unsteady with gait without use of AD, increased lateral sway to L. Pt demonstrates a step-through gait pattern with slight lateral sway to L with use of RW. Pt requires verbal cueing to keep RW close to body for safety. 2LO2 used with ambulation. Pt O2 sats maintained at 91%-95% throughout ambulation.  Stairs            Wheelchair Mobility    Modified Rankin (Stroke Patients Only)       Balance Overall balance assessment: Needs assistance Sitting-balance support: Bilateral upper extremity supported;Feet supported Sitting balance-Leahy Scale: Good Sitting balance - Comments: Pt able to maintain sitting balance with B UE support on bed.    Standing balance support: Bilateral upper extremity supported Standing balance-Leahy Scale: Good Standing balance comment: Pt requires B UE support on RW to maintain standing balance.  Pertinent Vitals/Pain Pain Assessment: No/denies pain    Home Living Family/patient expects to be discharged to:: Private residence Living Arrangements: Spouse/significant other Available Help at Discharge: Family Type of Home: House Home Access: Stairs to enter Entrance Stairs-Rails: Can reach both Entrance Stairs-Number  of Steps: 1 (small step) Home Layout: One level Home Equipment: Walker - 2 wheels      Prior Function Level of Independence: Independent         Comments: Pt reports no use of AD at home, but does have a 2WRW. Pt was independent with household ambulation but did not ambulate in community.      Hand Dominance        Extremity/Trunk Assessment   Upper Extremity Assessment: Generalized weakness (L UE grossly 4/5. R UE grossly 3+/5)           Lower Extremity Assessment: Generalized weakness (L LE grossly 4/5. R LE grossly 3+5/)         Communication   Communication: No difficulties  Cognition Arousal/Alertness: Awake/alert Behavior During Therapy: WFL for tasks assessed/performed Overall Cognitive Status: Within Functional Limits for tasks assessed                      General Comments      Exercises        Assessment/Plan    PT Assessment Patient needs continued PT services  PT Diagnosis Difficulty walking;Generalized weakness   PT Problem List Decreased strength;Decreased range of motion;Decreased activity tolerance;Decreased balance;Decreased mobility;Decreased knowledge of use of DME;Decreased safety awareness  PT Treatment Interventions DME instruction;Gait training;Stair training;Functional mobility training;Therapeutic activities;Therapeutic exercise;Balance training;Patient/family education   PT Goals (Current goals can be found in the Care Plan section) Acute Rehab PT Goals Patient Stated Goal: To return home PT Goal Formulation: With patient Time For Goal Achievement: 05/15/16 Potential to Achieve Goals: Good    Frequency Min 2X/week   Barriers to discharge        Co-evaluation               End of Session Equipment Utilized During Treatment: Gait belt;Oxygen (2 L O2) Activity Tolerance: Patient tolerated treatment well Patient left: in chair;with call bell/phone within reach;with chair alarm set;with nursing/sitter in  room;with family/visitor present Nurse Communication: Mobility status         Time: [id number] PT Time Calculation (min) (ACUTE ONLY): 32 min   Charges:   PT Evaluation $PT Eval Moderate Complexity: 1 Procedure PT Treatments $Gait Training: 8-22 mins   PT G Codes:        Georgina Pillion 05-06-2016, 4:21 PM  Georgina Pillion, SPT [phone]

## 2016-05-01 NOTE — Consult Note (Signed)
Consult: hydronephrosis Requested by: Dr. Lavetta Nielsen  H&P  History of Present Illness: 82 yo admitted with hypoglycemia. Her baseline Cr is 2. She was treated for UTI with Bactrim. Her Cr rose to 4.35 and renal u/s was done yesterday which showed mild bilateral hydronephrosis left greater than right. The bladder was distended with about a liter of urine and a Foley catheter was placed. Since The catheter was placed she said about 3 L of urine output. The patient reports she was having a lot of trouble voiding in the bed and on the bedpan and was not able to empty. She reports she typically voids with a good stream at home and feels empty. She denies any prior hematuria or history of kidney stones. She has a history of hysterectomy but no other pelvic surgery or radiation. Neurogenic risk includes diabetes.  Past Medical History:  Diagnosis Date  . Angina pectoris ([place]) 08/26/2012  . Arthritis   . Breast mass, right   . Cancer [facility])    breast cancer, right side 2012  . Complication of anesthesia   . Coronary artery disease 08/22/2012   sees Dr Rockey Situ  . Diabetes ([place])   . Gastritis    hx of  . GERD (gastroesophageal reflux disease)   . Headache(784.0)    migraines  . History of breast cancer    39 treatments of radiation. Negative chemo.  Marland Kitchen History of seasonal allergies   . Hyperlipemia   . Hypertension    sees Dr. Fulton Reek  . Pneumonia    hx of  . PONV (postoperative nausea and vomiting)   . 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 Stroke ([place])   . Thyroid disease   . Vertigo    Past Surgical History:  Procedure Laterality Date  . ABDOMINAL HYSTERECTOMY    . APPENDECTOMY    . BACK SURGERY    . BREAST BIOPSY Right    2012 positive  . BREAST EXCISIONAL BIOPSY Right    2012 positive  . BREAST SURGERY    . CARDIAC CATHETERIZATION  2014  . CORONARY ARTERY BYPASS GRAFT  09/05/2012   Procedure: CORONARY ARTERY  BYPASS GRAFTING (CABG);  Surgeon: Rexene Alberts, MD;  Location: [place];  Service: Open Heart Surgery;  Laterality: N/A;  CABG x four, using left internal mammary artery and bilateral greater saphenous vein harvested endoscopically  . INTRAOPERATIVE TRANSESOPHAGEAL ECHOCARDIOGRAM  09/05/2012   Procedure: INTRAOPERATIVE TRANSESOPHAGEAL ECHOCARDIOGRAM;  Surgeon: Rexene Alberts, MD;  Location: [place];  Service: Open Heart Surgery;  Laterality: N/A;  . LAPAROSCOPIC NISSEN FUNDOPLICATION    . OVARIAN CYST REMOVAL      Home Medications:  Prescriptions Prior to Admission  Medication Sig Dispense Refill Last Dose  . ALPRAZolam (XANAX) 0.5 MG tablet Take 0.5 mg by mouth 2 (two) times daily as needed for anxiety.   prn at prn  . aspirin 325 MG EC tablet Take 325 mg by mouth daily.   unknown at unknown  . Calcium Carbonate-Vitamin D (CALCIUM 500 + D PO) Take 1 tablet by mouth daily.   unknown at unknown  . cloNIDine (CATAPRES) 0.2 MG tablet Take 1 tablet (0.2 mg total) by mouth 3 (three) times daily. 90 tablet 11 unknown at unknown  . cyanocobalamin (,VITAMIN B-12,) 1000 MCG/ML injection Inject 1,000 mcg into the muscle every 30 (thirty) days.   unknown at unknown  .  furosemide (LASIX) 40 MG tablet Take 1 tablet (40 mg total) by mouth daily as needed. 90 tablet 3 prn at prn  . gabapentin (NEURONTIN) 100 MG capsule Take 100 mg by mouth 2 (two) times daily.   unknown at unknown  . glimepiride (AMARYL) 4 MG tablet Take 4 mg by mouth 2 (two) times daily.   unknown at unknown  . hydrALAZINE (APRESOLINE) 100 MG tablet Take 1 tablet (100 mg total) by mouth 4 (four) times daily. 120 tablet 7 unknown at unknown  . HYDROcodone-acetaminophen (NORCO/VICODIN) 5-325 MG tablet Take 1-2 tablets by mouth every 4 (four) hours as needed for moderate pain. 30 tablet 0 prn at prn  . levothyroxine (SYNTHROID, LEVOTHROID) 175 MCG tablet Take 175 mcg by mouth daily before breakfast.   unknown at unknown  . metoprolol (LOPRESSOR)  50 MG tablet Take 1 tablet (50 mg total) by mouth every 8 (eight) hours. 90 tablet 5 unknown at unknown  . Omega-3 Fatty Acids (FISH OIL) 1000 MG CAPS Take 1,000 mg by mouth daily.    unknown at unknown   Allergies:  Allergies  Allergen Reactions  . Ciprocinonide [Fluocinolone]     Feels crazy  . Darvocet [Propoxyphene N-Acetaminophen]     Difficulty breathing and swallowing  . Duratuss G [Guaifenesin]     Other reaction(s): Unknown  . Ivp Dye [Iodinated Diagnostic Agents]     Nausea & vomiting  . Ketek  [Telithromycin]     Other reaction(s): Unknown  . Levaquin  [Levofloxacin] Nausea Only  . Other     Reports all cholesterol medication  . Statins     Muscle aches    Family History  Problem Relation Age of Onset  . Heart disease Brother     CABG & stents  . Hyperlipidemia Brother   . Hypertension Brother   . Cancer Father   . Heart disease Mother   . Breast cancer Neg Hx    Social History:  reports that she quit smoking about 27 years ago. Her smoking use included Cigarettes. She has never used smokeless tobacco. She reports that she does not drink alcohol or use drugs.  ROS: A complete review of systems was performed.  All systems are negative except for pertinent findings as noted. Review of Systems  All other systems reviewed and are negative.    Physical Exam:  Vital signs in last 24 hours: Temp:  [98.1 F (36.7 C)-98.8 F (37.1 C)] 98.2 F (36.8 C) (07/31 0451) Pulse Rate:  [84-87] 86 (07/31 0451) Resp:  [16-20] 16 (07/31 0451) BP: (138-150)/(54-60) 138/59 (07/31 0451) SpO2:  [95 %-99 %] 95 % (07/31 0451) Weight:  [100.5 kg (221 lb 8 oz)] 100.5 kg (221 lb 8 oz) (07/31 0451) General:  Alert and oriented, No acute distress HEENT: Normocephalic, atraumatic Cardiovascular: Regular rate and rhythm Lungs: Regular rate and effort Abdomen: Soft, nontender, nondistended, no abdominal masses Extremities: No edema Neurologic: Grossly intact  Laboratory Data:   Results for orders placed or performed during the hospital encounter of 04/27/16 (from the past 24 hour(s))  Protein / creatinine ratio, urine     Status: Abnormal   Collection Time: 04/30/16  1:46 PM  Result Value Ref Range   Creatinine, Urine 52 mg/dL   Total Protein, Urine 80 mg/dL   Protein Creatinine Ratio 1.54 (H) 0.00 - 0.15 mg/mg[Cre]  VITAMIN D 25 Hydroxy (Vit-D Deficiency, Fractures)     Status: Abnormal   Collection Time: 04/30/16  3:48 PM  Result Value Ref  Range   Vit D, 25-Hydroxy 20.0 (L) 30.0 - 100.0 ng/mL  Glucose, capillary     Status: Abnormal   Collection Time: 04/30/16  5:02 PM  Result Value Ref Range   Glucose-Capillary 142 (H) 65 - 99 mg/dL  Glucose, capillary     Status: Abnormal   Collection Time: 04/30/16  9:15 PM  Result Value Ref Range   Glucose-Capillary 135 (H) 65 - 99 mg/dL  Glucose, capillary     Status: Abnormal   Collection Time: 05/01/16  7:22 AM  Result Value Ref Range   Glucose-Capillary 126 (H) 65 - 99 mg/dL  Basic metabolic panel     Status: Abnormal   Collection Time: 05/01/16  7:45 AM  Result Value Ref Range   Sodium 135 135 - 145 mmol/L   Potassium 3.9 3.5 - 5.1 mmol/L   Chloride 101 101 - 111 mmol/L   CO2 27 22 - 32 mmol/L   Glucose, Bld 127 (H) 65 - 99 mg/dL   BUN 55 (H) 6 - 20 mg/dL   Creatinine, Ser 4.35 (H) 0.44 - 1.00 mg/dL   Calcium 7.7 (L) 8.9 - 10.3 mg/dL   GFR calc non Af Amer 9 (L) >60 mL/min   GFR calc Af Amer 10 (L) >60 mL/min   Anion gap 7 5 - 15  Glucose, capillary     Status: Abnormal   Collection Time: 05/01/16 11:21 AM  Result Value Ref Range   Glucose-Capillary 141 (H) 65 - 99 mg/dL   Recent Results (from the past 240 hour(s))  Urine culture     Status: Abnormal   Collection Time: 04/27/16 11:06 PM  Result Value Ref Range Status   Specimen Description URINE, CLEAN CATCH  Final   Special Requests Normal  Final   Culture (A)  Final    <10,000 COLONIES/mL INSIGNIFICANT GROWTH Performed at [facility]    Report Status 04/29/2016 FINAL  Final  MRSA PCR Screening     Status: None   Collection Time: 04/28/16  7:00 PM  Result Value Ref Range Status   MRSA by PCR NEGATIVE NEGATIVE Final    Comment:        The GeneXpert MRSA Assay (FDA approved for NASAL specimens only), is one component of a comprehensive MRSA colonization surveillance program. It is not intended to diagnose MRSA infection nor to guide or monitor treatment for MRSA infections.    Creatinine:  Recent Labs  04/27/16 2306 04/28/16 0440 04/29/16 0421 04/30/16 0502 05/01/16 0745  CREATININE 4.37* 4.48* 4.31* 4.34* 4.35*    Impression/Assessment/plan:  Bilateral hydronephrosis-this appeared mild and may be due to retention. Will repeat renal ultrasound. If hydronephrosis persists she may need a CT scan.  Urinary retention-not uncommon during hospitalization. Will likely improve when she is more ambulatory and back at home. She can follow-up in the outpatient office for her voiding dysfunction.  [name] 05/01/2016

## 2016-05-02 DIAGNOSIS — N390 Urinary tract infection, site not specified: Secondary | ICD-10-CM

## 2016-05-02 DIAGNOSIS — N179 Acute kidney failure, unspecified: Secondary | ICD-10-CM

## 2016-05-02 DIAGNOSIS — N1330 Unspecified hydronephrosis: Secondary | ICD-10-CM

## 2016-05-02 LAB — PROTEIN ELECTROPHORESIS, SERUM
A/G Ratio: 0.5 — ABNORMAL LOW (ref 0.7–1.7)
Albumin ELP: 2.3 g/dL — ABNORMAL LOW (ref 2.9–4.4)
Alpha-1-Globulin: 0.4 g/dL (ref 0.0–0.4)
Alpha-2-Globulin: 0.9 g/dL (ref 0.4–1.0)
Beta Globulin: 0.7 g/dL (ref 0.7–1.3)
Gamma Globulin: 2.8 g/dL — ABNORMAL HIGH (ref 0.4–1.8)
Globulin, Total: 4.7 g/dL — ABNORMAL HIGH (ref 2.2–3.9)
M-Spike, %: 1.5 g/dL — ABNORMAL HIGH
Total Protein ELP: 7 g/dL (ref 6.0–8.5)

## 2016-05-02 LAB — BASIC METABOLIC PANEL
Anion gap: 7 (ref 5–15)
BUN: 59 mg/dL — ABNORMAL HIGH (ref 6–20)
CO2: 27 mmol/L (ref 22–32)
Calcium: 7.7 mg/dL — ABNORMAL LOW (ref 8.9–10.3)
Chloride: 103 mmol/L (ref 101–111)
Creatinine, Ser: 4.36 mg/dL — ABNORMAL HIGH (ref 0.44–1.00)
GFR calc Af Amer: 10 mL/min — ABNORMAL LOW (ref >60)
GFR calc non Af Amer: 9 mL/min — ABNORMAL LOW (ref >60)
Glucose, Bld: 120 mg/dL — ABNORMAL HIGH (ref 65–99)
Potassium: 3.4 mmol/L — ABNORMAL LOW (ref 3.5–5.1)
Sodium: 137 mmol/L (ref 135–145)

## 2016-05-02 LAB — GLUCOSE, CAPILLARY
Glucose-Capillary: 115 mg/dL — ABNORMAL HIGH (ref 65–99)
Glucose-Capillary: 159 mg/dL — ABNORMAL HIGH (ref 65–99)
Glucose-Capillary: 145 mg/dL — ABNORMAL HIGH (ref 65–99)

## 2016-05-02 LAB — MPO/PR-3 (ANCA) ANTIBODIES
ANCA Proteinase 3: <3.5 U/mL (ref 0.0–3.5)
Myeloperoxidase Abs: 48 U/mL — ABNORMAL HIGH (ref 0.0–9.0)

## 2016-05-02 MED ORDER — POTASSIUM CHLORIDE CRYS ER 20 MEQ PO TBCR
20.0000 meq | EXTENDED_RELEASE_TABLET | Freq: Once | ORAL | Status: AC
  Administered 2016-05-02: 09:00:00 20 meq via ORAL
  Filled 2016-05-02: qty 1

## 2016-05-02 NOTE — Progress Notes (Signed)
[facility]  ROUNDING NOTE   Subjective:   Husband at bedside.  Patient feels well but creatinine the same.   Objective:  Vital signs in last 24 hours:  Temp:  [97.9 F (36.6 C)-98.9 F (37.2 C)] 97.9 F (36.6 C) (08/01 0424) Pulse Rate:  [78-95] 85 (08/01 0424) Resp:  [14-20] 14 (08/01 0424) BP: (113-157)/(47-60) 157/59 (08/01 0424) SpO2:  [89 %-95 %] 92 % (08/01 1027) Weight:  [98.8 kg (217 lb 14.4 oz)] 98.8 kg (217 lb 14.4 oz) (08/01 0424)  Weight change: -1.633 kg (-3 lb 9.6 oz) Filed Weights   04/30/16 0627 05/01/16 0451 05/02/16 0424  Weight: 100.8 kg (222 lb 2 oz) 100.5 kg (221 lb 8 oz) 98.8 kg (217 lb 14.4 oz)    Intake/Output: I/O last 3 completed shifts: In: 1035.8 [P.O.:480; I.V.:555.8] Out: 2950 [Urine:2950]   Intake/Output this shift:  Total I/O In: 240 [P.O.:240] Out: -   Physical Exam: General: NAD, laying in bed  Head: Normocephalic, atraumatic. Moist oral mucosal membranes  Eyes: Anicteric, PERRL  Neck: Supple, trachea midline  Lungs:  Clear to auscultation  Heart: Regular rate and rhythm  Abdomen:  Soft, nontender,   Extremities: no peripheral edema.  Neurologic: Nonfocal, moving all four extremities  Skin: No lesions  GU:  Foley with urine    Basic Metabolic Panel:  Recent Labs Lab 04/28/16 0440 04/29/16 0421 04/30/16 0502 05/01/16 0745 05/02/16 0501  NA 134* 136 134* 135 137  K 2.8* 3.9 3.8 3.9 3.4*  CL 97* 102 100* 101 103  CO2 26 28 25 27 27   GLUCOSE 27* 87 137* 127* 120*  BUN 59* 54* 56* 55* 59*  CREATININE 4.48* 4.31* 4.34* 4.35* 4.36*  CALCIUM 7.7* 7.4* 7.5* 7.7* 7.7*    Liver Function Tests:  Recent Labs Lab 04/27/16 2306  AST 24  ALT 11*  ALKPHOS 90  BILITOT 0.3  PROT 7.8  ALBUMIN 2.6*   No results for input(s): LIPASE, AMYLASE in the last 168 hours. No results for input(s): AMMONIA in the last 168 hours.  CBC:  Recent Labs Lab 04/27/16 2306 04/29/16 0421  WBC 12.3* 10.0  NEUTROABS  11.4*  --   HGB 8.9* 8.4*  HCT 26.6* 24.1*  MCV 83.6 82.5  PLT 228 194    Cardiac Enzymes:  Recent Labs Lab 04/27/16 2306  TROPONINI 0.05*    BNP: Invalid input(s): POCBNP  CBG:  Recent Labs Lab 05/01/16 1121 05/01/16 1734 05/01/16 2123 05/02/16 0743 05/02/16 0819  GLUCAP 141* 111* 139* 115* 159*    Microbiology: Results for orders placed or performed during the hospital encounter of 04/27/16  Urine culture     Status: Abnormal   Collection Time: 04/27/16 11:06 PM  Result Value Ref Range Status   Specimen Description URINE, CLEAN CATCH  Final   Special Requests Normal  Final   Culture (A)  Final    <10,000 COLONIES/mL INSIGNIFICANT GROWTH Performed at [facility]    Report Status 04/29/2016 FINAL  Final  MRSA PCR Screening     Status: None   Collection Time: 04/28/16  7:00 PM  Result Value Ref Range Status   MRSA by PCR NEGATIVE NEGATIVE Final    Comment:        The GeneXpert MRSA Assay (FDA approved for NASAL specimens only), is one component of a comprehensive MRSA colonization surveillance program. It is not intended to diagnose MRSA infection nor to guide or monitor treatment for MRSA infections.  Coagulation Studies: No results for input(s): LABPROT, INR in the last 72 hours.  Urinalysis: No results for input(s): COLORURINE, LABSPEC, PHURINE, GLUCOSEU, HGBUR, BILIRUBINUR, KETONESUR, PROTEINUR, UROBILINOGEN, NITRITE, LEUKOCYTESUR in the last 72 hours.  Invalid input(s): APPERANCEUR    Imaging: Dg Chest 1 View  Result Date: 05/01/2016 CLINICAL DATA:  Central chest pain and cough for 2 days EXAM: CHEST 1 VIEW COMPARISON:  04/28/2016 FINDINGS: Cardiac shadow remains enlarged. Postsurgical changes are again noted. There is been some improvement in the degree of interstitial changes consistent with improving edema. No focal infiltrate or sizable effusion is seen. No bony abnormality is noted. IMPRESSION: Improved interstitial edema when  compared with the prior exam. Electronically Signed   By: Inez Catalina M.D.   On: 05/01/2016 11:51  US Renal  Result Date: 05/01/2016 CLINICAL DATA:  Hydronephrosis.  Follow-up. EXAM: RENAL / URINARY TRACT ULTRASOUND COMPLETE COMPARISON:  04/30/2016 FINDINGS: Right Kidney: Length: 12.3 cm. Mildly echogenic renal parenchyma. No hydronephrosis or focal renal mass. Left Kidney: Length: 12.1 cm. Echogenicity within normal limits. Mildly prominent renal pelvis is noted, showing interval improvement compared prior study. Bladder: Foley catheter. IMPRESSION: 1. Interval resolution of right hydronephrosis. 2. Improved left hydronephrosis. 3. Foley catheter. Electronically Signed   By: Nolon Nations M.D.   On: 05/01/2016 17:24   US Renal  Result Date: 04/30/2016 CLINICAL DATA:  Acute renal failure. EXAM: RENAL / URINARY TRACT ULTRASOUND COMPLETE COMPARISON:  None. FINDINGS: Right Kidney: Length: 12.6 cm. Moderate hydronephrosis with increased renal echogenicity identified. There is no evidence of solid renal mass. Left Kidney: Length: 12.8 cm. Moderate hydronephrosis with increased renal echogenicity identified. There is no evidence of solid mass. Bladder: The bladder is distended. IMPRESSION: Bladder distention with moderate bilateral hydronephrosis. Increased bilateral renal echogenicity compatible with medical renal disease. Electronically Signed   By: Margarette Canada M.D.   On: 04/30/2016 15:26    Medications:   . sodium chloride 50 mL/hr at 05/02/16 0705   . aspirin  325 mg Oral Daily  . cloNIDine  0.2 mg Oral TID  . cyanocobalamin  1,000 mcg Intramuscular Q30 days  . docusate sodium  100 mg Oral BID  . gabapentin  100 mg Oral BID  . heparin  5,000 Units Subcutaneous Q8H  . hydrALAZINE  100 mg Oral QID  . ipratropium-albuterol  3 mL Nebulization Once  . levothyroxine  150 mcg Oral QAC breakfast  . metoprolol  50 mg Oral Q8H  . omega-3 acid ethyl esters  1 g Oral Daily  . sodium chloride  1,000  mL Intravenous Once   acetaminophen **OR** acetaminophen, alum & mag hydroxide-simeth, hydrALAZINE, HYDROcodone-acetaminophen, ipratropium-albuterol, labetalol, [DISCONTINUED] ondansetron **OR** ondansetron (ZOFRAN) IV, ondansetron, promethazine, zolpidem  Assessment/ Plan:  [name] is a 82 y.o. white female with osteoarthritis, right breast cancer status post radiation therapy, coronary artery disease status post four-vessel CABG, diabetes mellitus type 2, GERD, hyperlipidemia, hypertension, hypothyroidism who was admitted to [facility] on 04/27/2016 for evaluation of near syncope.   1. Acute renal failure on chronic kidney disease stage IV with proteinuria:  Baseline creatinine 2.0 EGFR 24 Hydronephrosis on ultrasound. Foley placed. Appreciate urology input.  Will need close outpatient follow up.   2. Hypertension: well controlled. Holding furosemide.  - clonidine, hydralazine and metoprolol.   3. Anemia of chronic kidney disease.  - low threshold to start epo.    LOS: [place], [place] 8/1/201711:22 AM

## 2016-05-02 NOTE — Progress Notes (Signed)
Urology Consult Follow Up  Subjective: Patient without complaints this morning.  Foley in place draining clear yellow urine.  Good urine output.  VSS.  Afebrile.  Renal ultrasound performed yesterday evening noted resolution of the right hydronephrosis and improvement in the left hydronephrosis.  No past imaging available to establish a baseline.  Creatinine still elevated at 4.36.    Anti-infectives: Anti-infectives    Start     Dose/Rate Route Frequency Ordered Stop   04/29/16 1000  cefTRIAXone (ROCEPHIN) 1 g in dextrose 5 % 50 mL IVPB  Status:  Discontinued     1 g 100 mL/hr over 30 Minutes Intravenous Every 24 hours 04/28/16 0928 05/01/16 1154   04/28/16 1000  sulfamethoxazole-trimethoprim (BACTRIM DS,SEPTRA DS) 800-160 MG per tablet 1 tablet  Status:  Discontinued     1 tablet Oral Every 12 hours 04/28/16 0157 04/28/16 0714   04/28/16 0030  cefTRIAXone (ROCEPHIN) 1 g in dextrose 5 % 50 mL IVPB  Status:  Discontinued     1 g 100 mL/hr over 30 Minutes Intravenous Every 24 hours 04/28/16 0017 04/28/16 0928      Current Facility-Administered Medications  Medication Dose Route Frequency Provider Last Rate Last Dose  . 0.9 %  sodium chloride infusion   Intravenous Continuous Loletha Grayer, MD 50 mL/hr at 05/01/16 2358    . acetaminophen (TYLENOL) tablet 650 mg  650 mg Oral Q6H PRN Harrie Foreman, MD       Or  . acetaminophen (TYLENOL) suppository 650 mg  650 mg Rectal Q6H PRN Harrie Foreman, MD      . alum & mag hydroxide-simeth (MAALOX/MYLANTA) 200-200-20 MG/5ML suspension 30 mL  30 mL Oral PRN Lytle Butte, MD   30 mL at 04/30/16 1546  . aspirin EC tablet 325 mg  325 mg Oral Daily Harrie Foreman, MD   325 mg at 05/01/16 1118  . cloNIDine (CATAPRES) tablet 0.2 mg  0.2 mg Oral TID Harrie Foreman, MD   0.2 mg at 05/01/16 1543  . cyanocobalamin ((VITAMIN B-12)) injection 1,000 mcg  1,000 mcg Intramuscular Q30 days Harrie Foreman, MD   1,000 mcg at 04/28/16 0945  . docusate  sodium (COLACE) capsule 100 mg  100 mg Oral BID Harrie Foreman, MD   100 mg at 05/01/16 2117  . gabapentin (NEURONTIN) capsule 100 mg  100 mg Oral BID Harrie Foreman, MD   100 mg at 05/01/16 2118  . heparin injection 5,000 Units  5,000 Units Subcutaneous Q8H Harrie Foreman, MD   5,000 Units at 05/02/16 [id number]  . hydrALAZINE (APRESOLINE) injection 10 mg  10 mg Intravenous Q6H PRN Henreitta Leber, MD      . hydrALAZINE (APRESOLINE) tablet 100 mg  100 mg Oral QID Harrie Foreman, MD   100 mg at 05/01/16 2117  . HYDROcodone-acetaminophen (NORCO/VICODIN) 5-325 MG per tablet 1-2 tablet  1-2 tablet Oral Q4H PRN Harrie Foreman, MD   1 tablet at 04/29/16 [id number]  . ipratropium-albuterol (DUONEB) 0.5-2.5 (3) MG/3ML nebulizer solution 3 mL  3 mL Nebulization Once Paulette Blanch, MD      . ipratropium-albuterol (DUONEB) 0.5-2.5 (3) MG/3ML nebulizer solution 3 mL  3 mL Nebulization Q6H PRN Loletha Grayer, MD      . labetalol (NORMODYNE,TRANDATE) injection 10 mg  10 mg Intravenous Q2H PRN Henreitta Leber, MD   10 mg at 04/28/16 1733  . levothyroxine (SYNTHROID, LEVOTHROID) tablet 150 mcg  150 mcg Oral QAC breakfast  Harrie Foreman, MD   150 mcg at 05/01/16 [phone]  . metoprolol (LOPRESSOR) tablet 50 mg  50 mg Oral Q8H Harrie Foreman, MD   50 mg at 05/02/16 0250  . omega-3 acid ethyl esters (LOVAZA) capsule 1 g  1 g Oral Daily Harrie Foreman, MD   1 g at 05/01/16 1116  . ondansetron (ZOFRAN) injection 4 mg  4 mg Intravenous Q6H PRN Harrie Foreman, MD   4 mg at 04/30/16 [id number]  . ondansetron (ZOFRAN) tablet 4 mg  4 mg Oral Q4H PRN Harrie Foreman, MD   4 mg at 05/01/16 1117  . promethazine (PHENERGAN) injection 12.5 mg  12.5 mg Intravenous Q6H PRN Henreitta Leber, MD   12.5 mg at 04/28/16 1456  . sodium chloride 0.9 % bolus 1,000 mL  1,000 mL Intravenous Once Harrie Foreman, MD      . zolpidem [facility]) tablet 5 mg  5 mg Oral QHS PRN Harrie Foreman, MD         Objective: Vital signs in last  24 hours: Temp:  [97.9 F (36.6 C)-98.9 F (37.2 C)] 97.9 F (36.6 C) (08/01 0424) Pulse Rate:  [78-95] 85 (08/01 0424) Resp:  [14-20] 14 (08/01 0424) BP: (113-157)/(47-60) 157/59 (08/01 0424) SpO2:  [89 %-95 %] 94 % (08/01 0424) Weight:  [217 lb 14.4 oz (98.8 kg)] 217 lb 14.4 oz (98.8 kg) (08/01 0424)  Intake/Output from previous day: 07/31 0701 - 08/01 0700 In: 1035.8 [P.O.:480; I.V.:555.8] Out: 1750 [Urine:1750] Intake/Output this shift: No intake/output data recorded.   Physical Exam Constitutional: Well nourished. Alert and oriented, No acute distress. HEENT: [place] AT, moist mucus membranes. Trachea midline, no masses. Cardiovascular: No clubbing, cyanosis, or edema. Respiratory: Normal respiratory effort, no increased work of breathing. GI: Abdomen is soft, non tender, non distended, no abdominal masses.  GU: No CVA tenderness.  No bladder fullness or masses.  Foley in place.   Skin: No rashes, bruises or suspicious lesions. Lymph: No cervical or inguinal adenopathy. Neurologic: Grossly intact, no focal deficits, moving all 4 extremities. Psychiatric: Normal mood and affect.  Lab Results:  No results for input(s): WBC, HGB, HCT, PLT in the last 72 hours. BMET  Recent Labs  05/01/16 0745 05/02/16 0501  NA 135 137  K 3.9 3.4*  CL 101 103  CO2 27 27  GLUCOSE 127* 120*  BUN 55* 59*  CREATININE 4.35* 4.36*  CALCIUM 7.7* 7.7*   PT/INR No results for input(s): LABPROT, INR in the last 72 hours. ABG No results for input(s): PHART, HCO3 in the last 72 hours.  Invalid input(s): PCO2, PO2  Studies/Results: Dg Chest 1 View  Result Date: 05/01/2016 CLINICAL DATA:  Central chest pain and cough for 2 days EXAM: CHEST 1 VIEW COMPARISON:  04/28/2016 FINDINGS: Cardiac shadow remains enlarged. Postsurgical changes are again noted. There is been some improvement in the degree of interstitial changes consistent with improving edema. No focal infiltrate or sizable effusion is  seen. No bony abnormality is noted. IMPRESSION: Improved interstitial edema when compared with the prior exam. Electronically Signed   By: Inez Catalina M.D.   On: 05/01/2016 11:51  US Renal  Result Date: 05/01/2016 CLINICAL DATA:  Hydronephrosis.  Follow-up. EXAM: RENAL / URINARY TRACT ULTRASOUND COMPLETE COMPARISON:  04/30/2016 FINDINGS: Right Kidney: Length: 12.3 cm. Mildly echogenic renal parenchyma. No hydronephrosis or focal renal mass. Left Kidney: Length: 12.1 cm. Echogenicity within normal limits. Mildly prominent renal pelvis is noted, showing interval improvement  compared prior study. Bladder: Foley catheter. IMPRESSION: 1. Interval resolution of right hydronephrosis. 2. Improved left hydronephrosis. 3. Foley catheter. Electronically Signed   By: Nolon Nations M.D.   On: 05/01/2016 17:24   US Renal  Result Date: 04/30/2016 CLINICAL DATA:  Acute renal failure. EXAM: RENAL / URINARY TRACT ULTRASOUND COMPLETE COMPARISON:  None. FINDINGS: Right Kidney: Length: 12.6 cm. Moderate hydronephrosis with increased renal echogenicity identified. There is no evidence of solid renal mass. Left Kidney: Length: 12.8 cm. Moderate hydronephrosis with increased renal echogenicity identified. There is no evidence of solid mass. Bladder: The bladder is distended. IMPRESSION: Bladder distention with moderate bilateral hydronephrosis. Increased bilateral renal echogenicity compatible with medical renal disease. Electronically Signed   By: Margarette Canada M.D.   On: 04/30/2016 15:26    Assessment: 1. Bilateral hydronephrosis  -Right hydronephrosis has resolved.  Still with some mild left hydronephrosis, but improved.    -No improvement seen in BUN or creatinine.  Nephrology on board.    2. Urinary retention  -Suggest continue Foley for bladder decompression.  -Follow up as outpatient for voiding dysfunction     LOS: 4 days    [facility] 05/02/2016

## 2016-05-02 NOTE — Progress Notes (Signed)
Physical Therapy Treatment Patient Details Name: [name] MRN: [id number] DOB: January 05, 1934 Today's Date: 05/02/2016    History of Present Illness Pt is a 82 y/o female admitted for acute kidney injury. Pt reports she was sitting on her couch at home when she passed out and fell on the floor. Cannot remember how it happened. When EMS arrived they found her blood sugar to be 40. Blood sugar and BP have been fluctuating throughout duration of stay in hospital. PMH includes DM, HTN, history of CVA, and CAD s/p CABG.    PT Comments    Pt is progressing towards goals. She did not ambulate as far as yesterday, but she remained on RA throughout session. Pt is min assist for most functional mobility. Standing balance was assessed and determined that she is an imminent fall risk without RW; though she claims she will not use her RW at home due to limited space to maneuver it. Pt ambulated 150' with several standing breaks due to O2 sats dropping 84-87% on RA' she reported to symptoms of dizziness. Pt remains appropriate for skilled PT at this time to address deficits in strength, balance, coordination, endurance, gait, and safe use of DME.    Follow Up Recommendations  Home health PT     Equipment Recommendations  None recommended by PT    Recommendations for Other Services       Precautions / Restrictions Precautions Precautions: Fall Precaution Comments: Imminent fall risk without RW Restrictions Weight Bearing Restrictions: No    Mobility  Bed Mobility Overal bed mobility: Needs Assistance Bed Mobility: Supine to Sit     Supine to sit: Min assist     General bed mobility comments: Pt requires min assist to position LEs off EOB and to pull with UEs into sitting.   Transfers Overall transfer level: Needs assistance Equipment used: 1 person hand held assist Transfers: Sit to/from Stand Sit to Stand: Min guard         General transfer comment: Pt pushes through bed and  PT CGA to transfer from sit/stand. Pt appears unsteady upon standing but is able to steady herself, no LOB noted. No symptoms of dizziness noted.   Ambulation/Gait Ambulation/Gait assistance: Min assist Ambulation Distance (Feet): 150 Feet Assistive device: Rolling walker (2 wheeled) Gait Pattern/deviations: Step-through pattern;Decreased stride length;Wide base of support;Staggering left Gait velocity: 10' in 11sec Gait velocity interpretation: <1.8 ft/sec, indicative of risk for recurrent falls General Gait Details: Pt ambulated with reduced stride length, wide BOS, and staggers even with RW. On RA O2 sats were 84-87% with frequent standing breaks and pursed lip breathing during ambulation. Iminent fall risk without RW. Pt reported no symptoms even with O2 at 84% on RA   Stairs            Wheelchair Mobility    Modified Rankin (Stroke Patients Only)       Balance Overall balance assessment: Needs assistance Sitting-balance support: Bilateral upper extremity supported Sitting balance-Leahy Scale: Good Sitting balance - Comments: Pt able to maintain sitting balance with B UE support on bed.    Standing balance support: No upper extremity supported Standing balance-Leahy Scale: Fair Standing balance comment: Pt requires B UE support on RW to maintain standing balance.                    Cognition Arousal/Alertness: Awake/alert Behavior During Therapy: WFL for tasks assessed/performed Overall Cognitive Status: Within Functional Limits for tasks assessed  Exercises Other Exercises Other Exercises: B LE: ankle pump X 10 reps in supine, quad sets X 10 reps in supine, LAQ X 10 reps in sitting, hip abd/add  X 10 reps in longsitting; all don with min assist and verbal/tactile cuing    General Comments        Pertinent Vitals/Pain Pain Assessment: No/denies pain    Home Living                      Prior Function             PT Goals (current goals can now be found in the care plan section) Acute Rehab PT Goals Patient Stated Goal: To return home PT Goal Formulation: With patient Time For Goal Achievement: 05/15/16 Potential to Achieve Goals: Good Progress towards PT goals: Progressing toward goals    Frequency  Min 2X/week    PT Plan Current plan remains appropriate    Co-evaluation             End of Session Equipment Utilized During Treatment: Gait belt Activity Tolerance: Treatment limited secondary to medical complications (Comment) (O2 sats limited ambulation) Patient left: in chair;with call bell/phone within reach;with family/visitor present     Time: 1100-1126 PT Time Calculation (min) (ACUTE ONLY): 26 min  Charges:                       G Codes:      Wilmon Conover May 31, 2016, 1:26 PM  Burnett Corrente, SPT [phone]

## 2016-05-02 NOTE — Progress Notes (Signed)
Discharge instructions given and went over with patient and husband at bedside. All questions answered. Follow up appointments reviewed. Urethral catheter care education provided. Materials provided to patient to empty and cleanse catheter. Patient discharged home with husband via wheelchair by nursing staff. Madlyn Frankel, RN

## 2016-05-02 NOTE — Care Management (Signed)
Discharge to home today per Dr. Lavetta Nielsen. Discussed home health agencies with Ms. Pinneo at the bedside.  [place]. Floydene Flock, representative for [facility] updated. Will be going home with foley in place. Husband will transport. Shelbie Ammons RN MSN CCM Care Management [phone]

## 2016-05-02 NOTE — Care Management Important Message (Signed)
Important Message  Patient Details  Name: BLONNIE CONSTANTINIDES MRN: [id number] Date of Birth: 1934/07/18   Medicare Important Message Given:  Yes    Shelbie Ammons, RN 05/02/2016, 8:04 AM

## 2016-05-02 NOTE — Discharge Summary (Signed)
[place] [facility] [place] NAME: [name]    MR#:  [id number]  DATE OF BIRTH:  [birth year]  DATE OF ADMISSION:  04/27/2016 ADMITTING PHYSICIAN: Harrie Foreman, MD  DATE OF DISCHARGE: 05/02/16  PRIMARY CARE PHYSICIAN: sparks    ADMISSION DIAGNOSIS:  Weakness [R53.1] Hypoglycemia [E16.2] UTI (lower urinary tract infection) [N39.0] Acute renal failure, unspecified acute renal failure type ([place]) [N17.9] Anemia, unspecified anemia type [D64.9]  DISCHARGE DIAGNOSIS:  Active Problems:   AKI (acute kidney injury) ([place]) hypoglycemia related to type 2 diabetes UTI  SECONDARY DIAGNOSIS:   Past Medical History:  Diagnosis Date  . Angina pectoris ([place]) 08/26/2012  . Arthritis   . Breast mass, right   . Cancer [facility])    breast cancer, right side 2012  . Complication of anesthesia   . Coronary artery disease 08/22/2012   sees Dr Rockey Situ  . Diabetes ([place])   . Gastritis    hx of  . GERD (gastroesophageal reflux disease)   . Headache(784.0)    migraines  . History of breast cancer    39 treatments of radiation. Negative chemo.  Marland Kitchen History of seasonal allergies   . Hyperlipemia   . Hypertension    sees Dr. Fulton Reek  . Pneumonia    hx of  . PONV (postoperative nausea and vomiting)   . 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 Stroke ([place])   . Thyroid disease   . Vertigo     HOSPITAL COURSE:  [name]  is a 82 y.o. female admitted 04/27/2016 with chief complaint Hypoglycemia . Please see H&P performed by Harrie Foreman, MD for further information. Patient presented with low blood glucose, weakness. She was placed in the ICU with dextrose infusion. It took a few days for her blood glucose to stabilize to the point of no longer requiring dextrose infusions. She was also noted to have a UTI on admission - completed a full course of ceftriaxone. She was also  noted to have significant renal injury/acure renal failure: evaluated by nephrology during her stay. With Iv fluid hydration she has made some improvement, but not complete. She was also evaluated by nephrology during admission for hydronephrosis - who recommended leaving foley in place until outpatient follow up. Prior to discharge she was informed her renal function is not yet at baseline and she could still have complications in regards to kidney function. She agress to close follow up with both pcp, nephrology, and urology as outpatient   DISCHARGE CONDITIONS:   stable  CONSULTS OBTAINED:  Treatment Team:  Anthonette Legato, MD Festus Aloe, MD  DRUG ALLERGIES:   Allergies  Allergen Reactions  . Ciprocinonide [Fluocinolone]     Feels crazy  . Darvocet [Propoxyphene N-Acetaminophen]     Difficulty breathing and swallowing  . Duratuss G [Guaifenesin]     Other reaction(s): Unknown  . Ivp Dye [Iodinated Diagnostic Agents]     Nausea & vomiting  . Ketek  [Telithromycin]     Other reaction(s): Unknown  . Levaquin  [Levofloxacin] Nausea Only  . Other     Reports all cholesterol medication  . Statins     Muscle aches    DISCHARGE MEDICATIONS:   Current Discharge Medication List    CONTINUE these medications which have NOT CHANGED   Details  ALPRAZolam (XANAX) 0.5 MG tablet Take 0.5 mg  by mouth 2 (two) times daily as needed for anxiety.    aspirin 325 MG EC tablet Take 325 mg by mouth daily.    Calcium Carbonate-Vitamin D (CALCIUM 500 + D PO) Take 1 tablet by mouth daily.    cloNIDine (CATAPRES) 0.2 MG tablet Take 1 tablet (0.2 mg total) by mouth 3 (three) times daily. Qty: 90 tablet, Refills: 11    cyanocobalamin (,VITAMIN B-12,) 1000 MCG/ML injection Inject 1,000 mcg into the muscle every 30 (thirty) days.    gabapentin (NEURONTIN) 100 MG capsule Take 100 mg by mouth 2 (two) times daily.    hydrALAZINE (APRESOLINE) 100 MG tablet Take 1 tablet (100 mg total) by mouth  4 (four) times daily. Qty: 120 tablet, Refills: 7   Associated Diagnoses: Essential hypertension; Coronary artery disease involving coronary bypass graft of native heart with unspecified angina pectoris    HYDROcodone-acetaminophen (NORCO/VICODIN) 5-325 MG tablet Take 1-2 tablets by mouth every 4 (four) hours as needed for moderate pain. Qty: 30 tablet, Refills: 0    levothyroxine (SYNTHROID, LEVOTHROID) 175 MCG tablet Take 175 mcg by mouth daily before breakfast.    metoprolol (LOPRESSOR) 50 MG tablet Take 1 tablet (50 mg total) by mouth every 8 (eight) hours. Qty: 90 tablet, Refills: 5    Omega-3 Fatty Acids (FISH OIL) 1000 MG CAPS Take 1,000 mg by mouth daily.       STOP taking these medications     furosemide (LASIX) 40 MG tablet      glimepiride (AMARYL) 4 MG tablet      ipratropium-albuterol (DUONEB) 0.5-2.5 (3) MG/3ML SOLN      ondansetron (ZOFRAN) 4 MG tablet      zolpidem (AMBIEN) 5 MG tablet          DISCHARGE INSTRUCTIONS:    DIET:  Diabetic diet  DISCHARGE CONDITION:  Stable  ACTIVITY:  Activity as tolerated  OXYGEN:  Home Oxygen: No.   Oxygen Delivery: room air  DISCHARGE LOCATION:  home   If you experience worsening of your admission symptoms, develop shortness of breath, life threatening emergency, suicidal or homicidal thoughts you must seek medical attention immediately by calling 911 or calling your MD immediately  if symptoms less severe.  You Must read complete instructions/literature along with all the possible adverse reactions/side effects for all the Medicines you take and that have been prescribed to you. Take any new Medicines after you have completely understood and accpet all the possible adverse reactions/side effects.   Please note  You were cared for by a hospitalist during your hospital stay. If you have any questions about your discharge medications or the care you received while you were in the hospital after you are  discharged, you can call the unit and asked to speak with the hospitalist on call if the hospitalist that took care of you is not available. Once you are discharged, your primary care physician will handle any further medical issues. Please note that NO REFILLS for any discharge medications will be authorized once you are discharged, as it is imperative that you return to your primary care physician (or establish a relationship with a primary care physician if you do not have one) for your aftercare needs so that they can reassess your need for medications and monitor your lab values.    On the day of Discharge:   VITAL SIGNS:  Blood pressure (!) 157/59, pulse 85, temperature 97.9 F (36.6 C), temperature source Oral, resp. rate 14, height 5\' 4"  (1.626 m),  weight 217 lb 14.4 oz (98.8 kg), SpO2 92 %.  I/O:   Intake/Output Summary (Last 24 hours) at 05/02/16 1054 Last data filed at 05/02/16 1002  Gross per 24 hour  Intake          1035.83 ml  Output             1750 ml  Net          -714.17 ml    PHYSICAL EXAMINATION:  GENERAL:  82 y.o.-year-old patient lying in the bed with no acute distress.  EYES: Pupils equal, round, reactive to light and accommodation. No scleral icterus. Extraocular muscles intact.  HEENT: Head atraumatic, normocephalic. Oropharynx and nasopharynx clear.  NECK:  Supple, no jugular venous distention. No thyroid enlargement, no tenderness.  LUNGS: Normal breath sounds bilaterally, no wheezing, rales,rhonchi or crepitation. No use of accessory muscles of respiration.  CARDIOVASCULAR: S1, S2 normal. No murmurs, rubs, or gallops.  ABDOMEN: Soft, non-tender, non-distended. Bowel sounds present. No organomegaly or mass.  EXTREMITIES: No pedal edema, cyanosis, or clubbing.  NEUROLOGIC: Cranial nerves II through XII are intact. Muscle strength 5/5 in all extremities. Sensation intact. Gait not checked.  PSYCHIATRIC: The patient is alert and oriented x 3.  SKIN: No obvious  rash, lesion, or ulcer.   DATA REVIEW:   CBC  Recent Labs Lab 04/29/16 0421  WBC 10.0  HGB 8.4*  HCT 24.1*  PLT 194    Chemistries   Recent Labs Lab 04/27/16 2306  05/02/16 0501  NA 131*  < > 137  K 3.7  < > 3.4*  CL 93*  < > 103  CO2 26  < > 27  GLUCOSE 121*  < > 120*  BUN 55*  < > 59*  CREATININE 4.37*  < > 4.36*  CALCIUM 7.6*  < > 7.7*  AST 24  --   --   ALT 11*  --   --   ALKPHOS 90  --   --   BILITOT 0.3  --   --   < > = values in this interval not displayed.  Cardiac Enzymes  Recent Labs Lab 04/27/16 2306  TROPONINI 0.05*    Microbiology Results  Results for orders placed or performed during the hospital encounter of 04/27/16  Urine culture     Status: Abnormal   Collection Time: 04/27/16 11:06 PM  Result Value Ref Range Status   Specimen Description URINE, CLEAN CATCH  Final   Special Requests Normal  Final   Culture (A)  Final    <10,000 COLONIES/mL INSIGNIFICANT GROWTH Performed at [facility]    Report Status 04/29/2016 FINAL  Final  MRSA PCR Screening     Status: None   Collection Time: 04/28/16  7:00 PM  Result Value Ref Range Status   MRSA by PCR NEGATIVE NEGATIVE Final    Comment:        The GeneXpert MRSA Assay (FDA approved for NASAL specimens only), is one component of a comprehensive MRSA colonization surveillance program. It is not intended to diagnose MRSA infection nor to guide or monitor treatment for MRSA infections.     RADIOLOGY:  Dg Chest 1 View  Result Date: 05/01/2016 CLINICAL DATA:  Central chest pain and cough for 2 days EXAM: CHEST 1 VIEW COMPARISON:  04/28/2016 FINDINGS: Cardiac shadow remains enlarged. Postsurgical changes are again noted. There is been some improvement in the degree of interstitial changes consistent with improving edema. No focal infiltrate or sizable effusion is seen.  No bony abnormality is noted. IMPRESSION: Improved interstitial edema when compared with the prior exam.  Electronically Signed   By: Inez Catalina M.D.   On: 05/01/2016 11:51  US Renal  Result Date: 05/01/2016 CLINICAL DATA:  Hydronephrosis.  Follow-up. EXAM: RENAL / URINARY TRACT ULTRASOUND COMPLETE COMPARISON:  04/30/2016 FINDINGS: Right Kidney: Length: 12.3 cm. Mildly echogenic renal parenchyma. No hydronephrosis or focal renal mass. Left Kidney: Length: 12.1 cm. Echogenicity within normal limits. Mildly prominent renal pelvis is noted, showing interval improvement compared prior study. Bladder: Foley catheter. IMPRESSION: 1. Interval resolution of right hydronephrosis. 2. Improved left hydronephrosis. 3. Foley catheter. Electronically Signed   By: Nolon Nations M.D.   On: 05/01/2016 17:24   US Renal  Result Date: 04/30/2016 CLINICAL DATA:  Acute renal failure. EXAM: RENAL / URINARY TRACT ULTRASOUND COMPLETE COMPARISON:  None. FINDINGS: Right Kidney: Length: 12.6 cm. Moderate hydronephrosis with increased renal echogenicity identified. There is no evidence of solid renal mass. Left Kidney: Length: 12.8 cm. Moderate hydronephrosis with increased renal echogenicity identified. There is no evidence of solid mass. Bladder: The bladder is distended. IMPRESSION: Bladder distention with moderate bilateral hydronephrosis. Increased bilateral renal echogenicity compatible with medical renal disease. Electronically Signed   By: Margarette Canada M.D.   On: 04/30/2016 15:26    Management plans discussed with the patient, family and they are in agreement.  CODE STATUS:     Code Status Orders        Start     Ordered   04/28/16 0158  Full code  Continuous     04/28/16 0157    Code Status History    Date Active Date Inactive Code Status Order ID Comments User Context   07/17/2015 11:11 AM 07/20/2015  2:32 PM Full Code [id number]  Bettey Costa, MD Inpatient   09/10/2012  7:56 PM 09/23/2012  3:32 PM Full Code [id number]  Lorraine Lax, RN Inpatient   09/09/2012  8:17 AM 09/10/2012  7:56 PM Full Code [id number]   Rexene Alberts, MD Inpatient   09/05/2012  1:24 PM 09/09/2012  8:17 AM Full Code [id number]  Edwyna Perfect, RN Inpatient      TOTAL TIME TAKING CARE OF THIS PATIENT: 32 minutes.    Hower,  Karenann Cai.D on 05/02/2016 at 10:54 AM  Between 7am to 6pm - Pager - [phone]  After 6pm go to [email or web address] - Proofreader  [facility]  Office  [phone]  CC: Primary care physician; Anthonette Legato, MD

## 2016-05-03 LAB — GLOMERULAR BASEMENT MEMBRANE ANTIBODIES: GBM Ab: 5 units (ref 0–20)

## 2016-05-04 ENCOUNTER — Inpatient Hospital Stay: Payer: Medicare Other

## 2016-05-04 ENCOUNTER — Encounter: Payer: Self-pay | Admitting: Internal Medicine

## 2016-05-04 ENCOUNTER — Inpatient Hospital Stay
Admission: AD | Admit: 2016-05-04 | Discharge: 2016-05-09 | DRG: 684 | Disposition: A | Discharge status: Home Health | Payer: Medicare Other | Source: Ambulatory Visit | Attending: Internal Medicine | Admitting: Internal Medicine

## 2016-05-04 DIAGNOSIS — Z888 Allergy status to other drugs, medicaments and biological substances status: Secondary | ICD-10-CM

## 2016-05-04 DIAGNOSIS — N184 Chronic kidney disease, stage 4 (severe): Secondary | ICD-10-CM | POA: Diagnosis present

## 2016-05-04 DIAGNOSIS — Z7984 Long term (current) use of oral hypoglycemic drugs: Secondary | ICD-10-CM

## 2016-05-04 DIAGNOSIS — Z91041 Radiographic dye allergy status: Secondary | ICD-10-CM | POA: Diagnosis not present

## 2016-05-04 DIAGNOSIS — Z923 Personal history of irradiation: Secondary | ICD-10-CM | POA: Diagnosis not present

## 2016-05-04 DIAGNOSIS — E785 Hyperlipidemia, unspecified: Secondary | ICD-10-CM | POA: Diagnosis present

## 2016-05-04 DIAGNOSIS — E1122 Type 2 diabetes mellitus with diabetic chronic kidney disease: Secondary | ICD-10-CM | POA: Diagnosis present

## 2016-05-04 DIAGNOSIS — Z853 Personal history of malignant neoplasm of breast: Secondary | ICD-10-CM | POA: Diagnosis not present

## 2016-05-04 DIAGNOSIS — Z8249 Family history of ischemic heart disease and other diseases of the circulatory system: Secondary | ICD-10-CM

## 2016-05-04 DIAGNOSIS — Z7982 Long term (current) use of aspirin: Secondary | ICD-10-CM | POA: Diagnosis not present

## 2016-05-04 DIAGNOSIS — I2510 Atherosclerotic heart disease of native coronary artery without angina pectoris: Secondary | ICD-10-CM | POA: Diagnosis present

## 2016-05-04 DIAGNOSIS — E039 Hypothyroidism, unspecified: Secondary | ICD-10-CM | POA: Diagnosis present

## 2016-05-04 DIAGNOSIS — Z881 Allergy status to other antibiotic agents status: Secondary | ICD-10-CM

## 2016-05-04 DIAGNOSIS — I1310 Hypertensive heart and chronic kidney disease without heart failure, with stage 1 through stage 4 chronic kidney disease, or unspecified chronic kidney disease: Secondary | ICD-10-CM | POA: Diagnosis present

## 2016-05-04 DIAGNOSIS — N179 Acute kidney failure, unspecified: Principal | ICD-10-CM | POA: Diagnosis present

## 2016-05-04 DIAGNOSIS — R319 Hematuria, unspecified: Secondary | ICD-10-CM

## 2016-05-04 DIAGNOSIS — Z951 Presence of aortocoronary bypass graft: Secondary | ICD-10-CM

## 2016-05-04 DIAGNOSIS — D631 Anemia in chronic kidney disease: Secondary | ICD-10-CM | POA: Diagnosis present

## 2016-05-04 DIAGNOSIS — Z87448 Personal history of other diseases of urinary system: Secondary | ICD-10-CM | POA: Diagnosis not present

## 2016-05-04 DIAGNOSIS — Z79899 Other long term (current) drug therapy: Secondary | ICD-10-CM

## 2016-05-04 DIAGNOSIS — R059 Cough, unspecified: Secondary | ICD-10-CM

## 2016-05-04 DIAGNOSIS — J449 Chronic obstructive pulmonary disease, unspecified: Secondary | ICD-10-CM | POA: Diagnosis present

## 2016-05-04 DIAGNOSIS — R05 Cough: Secondary | ICD-10-CM

## 2016-05-04 DIAGNOSIS — R339 Retention of urine, unspecified: Secondary | ICD-10-CM | POA: Diagnosis not present

## 2016-05-04 DIAGNOSIS — N178 Other acute kidney failure: Secondary | ICD-10-CM

## 2016-05-04 DIAGNOSIS — K219 Gastro-esophageal reflux disease without esophagitis: Secondary | ICD-10-CM | POA: Diagnosis present

## 2016-05-04 DIAGNOSIS — Z87891 Personal history of nicotine dependence: Secondary | ICD-10-CM | POA: Diagnosis not present

## 2016-05-04 LAB — URINALYSIS COMPLETE WITH MICROSCOPIC (ARMC ONLY)
Bilirubin Urine: NEGATIVE
Glucose, UA: NEGATIVE mg/dL
Ketones, ur: NEGATIVE mg/dL
Leukocytes, UA: NEGATIVE
Nitrite: NEGATIVE
Protein, ur: 100 mg/dL — AB
Specific Gravity, Urine: 1.01 (ref 1.005–1.030)
pH: 5 (ref 5.0–8.0)

## 2016-05-04 LAB — PROTEIN / CREATININE RATIO, URINE
Creatinine, Urine: 61 mg/dL
Protein Creatinine Ratio: 1.56 mg/mg{Cre} — ABNORMAL HIGH (ref 0.00–0.15)
Total Protein, Urine: 95 mg/dL

## 2016-05-04 LAB — CBC
HCT: 21.9 % — ABNORMAL LOW (ref 35.0–47.0)
Hemoglobin: 7.6 g/dL — ABNORMAL LOW (ref 12.0–16.0)
MCH: 29.1 pg (ref 26.0–34.0)
MCHC: 34.8 g/dL (ref 32.0–36.0)
MCV: 83.6 fL (ref 80.0–100.0)
Platelets: 204 10*3/uL (ref 150–440)
RBC: 2.62 MIL/uL — ABNORMAL LOW (ref 3.80–5.20)
RDW: 14.8 % — ABNORMAL HIGH (ref 11.5–14.5)
WBC: 8.3 10*3/uL (ref 3.6–11.0)

## 2016-05-04 LAB — BASIC METABOLIC PANEL
Anion gap: 10 (ref 5–15)
BUN: 57 mg/dL — ABNORMAL HIGH (ref 6–20)
CO2: 23 mmol/L (ref 22–32)
Calcium: 8 mg/dL — ABNORMAL LOW (ref 8.9–10.3)
Chloride: 101 mmol/L (ref 101–111)
Creatinine, Ser: 4.12 mg/dL — ABNORMAL HIGH (ref 0.44–1.00)
GFR calc Af Amer: 11 mL/min — ABNORMAL LOW (ref >60)
GFR calc non Af Amer: 9 mL/min — ABNORMAL LOW (ref >60)
Glucose, Bld: 163 mg/dL — ABNORMAL HIGH (ref 65–99)
Potassium: 3.8 mmol/L (ref 3.5–5.1)
Sodium: 134 mmol/L — ABNORMAL LOW (ref 135–145)

## 2016-05-04 LAB — GLUCOSE, CAPILLARY
Glucose-Capillary: 102 mg/dL — ABNORMAL HIGH (ref 65–99)
Glucose-Capillary: 133 mg/dL — ABNORMAL HIGH (ref 65–99)
Glucose-Capillary: 272 mg/dL — ABNORMAL HIGH (ref 65–99)

## 2016-05-04 MED ORDER — LEVOTHYROXINE SODIUM 75 MCG PO TABS
175.0000 ug | ORAL_TABLET | Freq: Every day | ORAL | Status: DC
  Administered 2016-05-05 – 2016-05-09 (×4): 175 ug via ORAL
  Filled 2016-05-04 (×4): qty 1

## 2016-05-04 MED ORDER — METOPROLOL TARTRATE 50 MG PO TABS
50.0000 mg | ORAL_TABLET | Freq: Two times a day (BID) | ORAL | Status: DC
  Administered 2016-05-05 – 2016-05-09 (×9): 50 mg via ORAL
  Filled 2016-05-04 (×9): qty 1

## 2016-05-04 MED ORDER — INSULIN ASPART 100 UNIT/ML ~~LOC~~ SOLN
0.0000 [IU] | Freq: Three times a day (TID) | SUBCUTANEOUS | Status: DC
  Administered 2016-05-04: 1 [IU] via SUBCUTANEOUS
  Administered 2016-05-05: 5 [IU] via SUBCUTANEOUS
  Administered 2016-05-05: 1 [IU] via SUBCUTANEOUS
  Administered 2016-05-05 – 2016-05-07 (×6): 3 [IU] via SUBCUTANEOUS
  Administered 2016-05-07: 2 [IU] via SUBCUTANEOUS
  Administered 2016-05-08: 3 [IU] via SUBCUTANEOUS
  Administered 2016-05-08: 2 [IU] via SUBCUTANEOUS
  Administered 2016-05-09: 5 [IU] via SUBCUTANEOUS
  Administered 2016-05-09: 3 [IU] via SUBCUTANEOUS
  Filled 2016-05-04 (×3): qty 3
  Filled 2016-05-04: qty 2
  Filled 2016-05-04: qty 3
  Filled 2016-05-04: qty 2
  Filled 2016-05-04 (×2): qty 3
  Filled 2016-05-04: qty 1
  Filled 2016-05-04: qty 5
  Filled 2016-05-04 (×2): qty 3
  Filled 2016-05-04: qty 2
  Filled 2016-05-04: qty 5
  Filled 2016-05-04: qty 2

## 2016-05-04 MED ORDER — INSULIN ASPART 100 UNIT/ML ~~LOC~~ SOLN
0.0000 [IU] | Freq: Every day | SUBCUTANEOUS | Status: DC
  Administered 2016-05-04 – 2016-05-05 (×2): 3 [IU] via SUBCUTANEOUS
  Administered 2016-05-06: 2 [IU] via SUBCUTANEOUS
  Administered 2016-05-07 – 2016-05-08 (×2): 4 [IU] via SUBCUTANEOUS
  Filled 2016-05-04: qty 3
  Filled 2016-05-04 (×2): qty 4
  Filled 2016-05-04: qty 3

## 2016-05-04 MED ORDER — HYDRALAZINE HCL 20 MG/ML IJ SOLN
10.0000 mg | INTRAMUSCULAR | Status: DC | PRN
  Filled 2016-05-04 (×2): qty 1

## 2016-05-04 MED ORDER — ONDANSETRON HCL 4 MG/2ML IJ SOLN: 4.0000 mg | Freq: Four times a day (QID) | INTRAMUSCULAR | Status: DC | PRN

## 2016-05-04 MED ORDER — ONDANSETRON HCL 4 MG PO TABS: 4.0000 mg | ORAL_TABLET | Freq: Four times a day (QID) | ORAL | Status: DC | PRN

## 2016-05-04 MED ORDER — SODIUM CHLORIDE 0.9 % IV SOLN: INTRAVENOUS | Status: DC

## 2016-05-04 MED ORDER — HEPARIN SODIUM (PORCINE) 5000 UNIT/ML IJ SOLN
5000.0000 [IU] | Freq: Three times a day (TID) | INTRAMUSCULAR | Status: DC
  Administered 2016-05-04 – 2016-05-09 (×13): 5000 [IU] via SUBCUTANEOUS
  Filled 2016-05-04 (×13): qty 1

## 2016-05-04 MED ORDER — CLONIDINE HCL 0.1 MG PO TABS
0.2000 mg | ORAL_TABLET | Freq: Three times a day (TID) | ORAL | Status: DC
  Administered 2016-05-04 – 2016-05-09 (×15): 0.2 mg via ORAL
  Filled 2016-05-04 (×15): qty 2

## 2016-05-04 MED ORDER — ALPRAZOLAM 0.5 MG PO TABS
0.5000 mg | ORAL_TABLET | Freq: Two times a day (BID) | ORAL | Status: DC | PRN
  Administered 2016-05-04 – 2016-05-08 (×6): 0.5 mg via ORAL
  Filled 2016-05-04 (×7): qty 1

## 2016-05-04 MED ORDER — HYDRALAZINE HCL 50 MG PO TABS
100.0000 mg | ORAL_TABLET | Freq: Four times a day (QID) | ORAL | Status: DC
  Administered 2016-05-04 – 2016-05-09 (×19): 100 mg via ORAL
  Filled 2016-05-04 (×19): qty 2

## 2016-05-04 MED ORDER — ASPIRIN 325 MG PO TABS
325.0000 mg | ORAL_TABLET | Freq: Every day | ORAL | Status: DC
  Administered 2016-05-05: 325 mg via ORAL
  Filled 2016-05-04: qty 1

## 2016-05-04 MED ORDER — SODIUM CHLORIDE 0.9 % IV SOLN
1000.0000 mg | INTRAVENOUS | Status: DC
  Administered 2016-05-05 – 2016-05-06 (×2): 1000 mg via INTRAVENOUS
  Filled 2016-05-04 (×3): qty 8

## 2016-05-04 NOTE — Progress Notes (Signed)
MD notified of pt c/o SOB and requiring 2L O2 [place]. Pt states symptoms are now improving and that she thinks she just got hot from the steroids shes on. MD also notified of DBP (53) okay to hold metoprolol for now and re evaluate.

## 2016-05-04 NOTE — H&P (Signed)
[facility] [place] NAME: [name]    MR#:  [id number]  DATE OF BIRTH:  07-13-1934   DATE OF ADMISSION:  05/04/2016  PRIMARY CARE PHYSICIAN: [email or web address]   REQUESTING/REFERRING PHYSICIAN: lateef  CHIEF COMPLAINT:  Abnormal labs  HISTORY OF PRESENT ILLNESS:  [name]  is a 82 y.o. female with a known history of type 2 diabetes non insulin requiring presenting with renal failure and mpo positive. She was recently discharged by myself from [facility] - with dc diagnosis hypoglycemia associated with type 2 diabetes and acute renal failure. She was evaluated by nephrology as well as urology during that admission -- foley placed given hydronephrosis. Her renal function stabilized but never returned to baseline. She was discharged with instruction of close outpatient follow up. Her serologies taken on prior admission returned positive with mpo-anca positive  - highly concerning for microscopic polyangiitis. Upon further questioning she complains of fatigue, non productive cough, occasional nose bleeds. Case discussed with nephrology and accepted as direct admission  PAST MEDICAL HISTORY:   Past Medical History:  Diagnosis Date  . Angina pectoris ([place]) 08/26/2012  . Arthritis   . Breast mass, right   . Cancer [facility])    breast cancer, right side 2012  . Complication of anesthesia   . Coronary artery disease 08/22/2012   sees Dr Rockey Situ  . Diabetes ([place])   . Gastritis    hx of  . GERD (gastroesophageal reflux disease)   . Headache(784.0)    migraines  . History of breast cancer    39 treatments of radiation. Negative chemo.  Marland Kitchen History of seasonal allergies   . Hyperlipemia   . Hypertension    sees Dr. Fulton Reek  . Pneumonia    hx of  . PONV (postoperative nausea and vomiting)   . 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 Stroke ([place])   . Thyroid disease   . Vertigo       PAST SURGICAL HISTORY:   Past Surgical History:  Procedure Laterality Date  . ABDOMINAL HYSTERECTOMY    . APPENDECTOMY    . BACK SURGERY    . BREAST BIOPSY Right    2012 positive  . BREAST EXCISIONAL BIOPSY Right    2012 positive  . BREAST SURGERY    . CARDIAC CATHETERIZATION  2014  . CORONARY ARTERY BYPASS GRAFT  09/05/2012   Procedure: CORONARY ARTERY BYPASS GRAFTING (CABG);  Surgeon: Rexene Alberts, MD;  Location: [place];  Service: Open Heart Surgery;  Laterality: N/A;  CABG x four, using left internal mammary artery and bilateral greater saphenous vein harvested endoscopically  . INTRAOPERATIVE TRANSESOPHAGEAL ECHOCARDIOGRAM  09/05/2012   Procedure: INTRAOPERATIVE TRANSESOPHAGEAL ECHOCARDIOGRAM;  Surgeon: Rexene Alberts, MD;  Location: [place];  Service: Open Heart Surgery;  Laterality: N/A;  . LAPAROSCOPIC NISSEN FUNDOPLICATION    . OVARIAN CYST REMOVAL      SOCIAL HISTORY:   Social History  Substance Use Topics  . Smoking status: Former Smoker    Types: Cigarettes    Quit date: 11/13/1988  . Smokeless tobacco: Never Used  . Alcohol use No    FAMILY HISTORY:   Family History  Problem Relation Age of Onset  . Heart disease Brother     CABG & stents  . Hyperlipidemia Brother   . Hypertension Brother   . Cancer Father   .  Heart disease Mother   . Breast cancer Neg Hx     DRUG ALLERGIES:   Allergies  Allergen Reactions  . Ciprocinonide [Fluocinolone]     Feels crazy  . Darvocet [Propoxyphene N-Acetaminophen]     Difficulty breathing and swallowing  . Duratuss G [Guaifenesin]     Other reaction(s): Unknown  . Ivp Dye [Iodinated Diagnostic Agents]     Nausea & vomiting  . Ketek  [Telithromycin]     Other reaction(s): Unknown  . Levaquin  [Levofloxacin] Nausea Only  . Other     Reports all cholesterol medication  . Statins     Muscle aches    REVIEW OF SYSTEMS:  REVIEW OF SYSTEMS:  CONSTITUTIONAL: Denies fevers, chills,positive fatigue, weakness.   EYES: Denies blurred vision, double vision, or eye pain.  EARS, NOSE, THROAT: Denies tinnitus, ear pain, hearing loss.  RESPIRATORY: positive cough, denies shortness of breath, wheezing  CARDIOVASCULAR: Denies chest pain, palpitations, edema.  GASTROINTESTINAL: Denies nausea, vomiting, diarrhea, abdominal pain.  GENITOURINARY: Denies dysuria, hematuria.  ENDOCRINE: Denies nocturia or thyroid problems. HEMATOLOGIC AND LYMPHATIC: Denies easy bruising or bleeding.  SKIN: Denies rash or lesions.  MUSCULOSKELETAL: Denies pain in neck, back, shoulder, knees, hips, or further arthritic symptoms.  NEUROLOGIC: Denies paralysis, paresthesias.  PSYCHIATRIC: Denies anxiety or depressive symptoms. Otherwise full review of systems performed by me is negative.   MEDICATIONS AT HOME:   Prior to Admission medications   Medication Sig Start Date End Date Taking? Authorizing Provider  ALPRAZolam Duanne Moron) 0.5 MG tablet Take 0.5 mg by mouth 2 (two) times daily as needed for anxiety.    Historical Provider, MD  aspirin 325 MG EC tablet Take 325 mg by mouth daily.    Historical Provider, MD  Calcium Carbonate-Vitamin D (CALCIUM 500 + D PO) Take 1 tablet by mouth daily.    Historical Provider, MD  cloNIDine (CATAPRES) 0.2 MG tablet Take 1 tablet (0.2 mg total) by mouth 3 (three) times daily. 07/20/15   Idelle Crouch, MD  cyanocobalamin (,VITAMIN B-12,) 1000 MCG/ML injection Inject 1,000 mcg into the muscle every 30 (thirty) days.    Historical Provider, MD  gabapentin (NEURONTIN) 100 MG capsule Take 100 mg by mouth 2 (two) times daily. 09/23/12   Bary Leriche, PA-C  hydrALAZINE (APRESOLINE) 100 MG tablet Take 1 tablet (100 mg total) by mouth 4 (four) times daily. 11/22/15   Minna Merritts, MD  HYDROcodone-acetaminophen (NORCO/VICODIN) 5-325 MG tablet Take 1-2 tablets by mouth every 4 (four) hours as needed for moderate pain. 07/20/15   Idelle Crouch, MD  levothyroxine (SYNTHROID, LEVOTHROID) 175 MCG  tablet Take 175 mcg by mouth daily before breakfast.    Historical Provider, MD  metoprolol (LOPRESSOR) 50 MG tablet Take 1 tablet (50 mg total) by mouth every 8 (eight) hours. 07/20/15   Idelle Crouch, MD  Omega-3 Fatty Acids (FISH OIL) 1000 MG CAPS Take 1,000 mg by mouth daily.     Historical Provider, MD      VITAL SIGNS:  Blood pressure (!) 164/70, pulse 89, temperature 97.5 F (36.4 C), temperature source Oral, resp. rate 19, SpO2 93 %.  PHYSICAL EXAMINATION:  VITAL SIGNS: Vitals:   05/04/16 1312  BP: (!) 164/70  Pulse: 89  Resp: 19  Temp: 97.5 F (36.4 C)   GENERAL:81 y.o.female currently in no acute distress.  HEAD: Normocephalic, atraumatic.  EYES: Pupils equal, round, reactive to light. Extraocular muscles intact. No scleral icterus.  MOUTH: Moist mucosal membrane. Dentition intact.  No abscess noted.  EAR, NOSE, THROAT: Clear without exudates. No external lesions.  NECK: Supple. No thyromegaly. No nodules. No JVD.  PULMONARY: Clear to ascultation, without wheeze rails or rhonci. No use of accessory muscles, Good respiratory effort. good air entry bilaterally CHEST: Nontender to palpation.  CARDIOVASCULAR: S1 and S2. Regular rate and rhythm. No murmurs, rubs, or gallops. No edema. Pedal pulses 2+ bilaterally.  GASTROINTESTINAL: Soft, nontender, nondistended. No masses. Positive bowel sounds. No hepatosplenomegaly.  MUSCULOSKELETAL: No swelling, clubbing, or edema. Range of motion full in all extremities.  NEUROLOGIC: Cranial nerves II through XII are intact. No gross focal neurological deficits. Sensation intact. Reflexes intact.  SKIN: No ulceration, lesions, rashes, or cyanosis. Skin warm and dry. Turgor intact.  PSYCHIATRIC: Mood, affect within normal limits. The patient is awake, alert and oriented x 3. Insight, judgment intact.    LABORATORY PANEL:   CBC  Recent Labs Lab 04/29/16 0421  WBC 10.0  HGB 8.4*  HCT 24.1*  PLT 194    ------------------------------------------------------------------------------------------------------------------  Chemistries   Recent Labs Lab 04/27/16 2306  05/02/16 0501  NA 131*  < > 137  K 3.7  < > 3.4*  CL 93*  < > 103  CO2 26  < > 27  GLUCOSE 121*  < > 120*  BUN 55*  < > 59*  CREATININE 4.37*  < > 4.36*  CALCIUM 7.6*  < > 7.7*  AST 24  --   --   ALT 11*  --   --   ALKPHOS 90  --   --   BILITOT 0.3  --   --   < > = values in this interval not displayed. ------------------------------------------------------------------------------------------------------------------  Cardiac Enzymes  Recent Labs Lab 04/27/16 2306  TROPONINI 0.05*   ------------------------------------------------------------------------------------------------------------------  RADIOLOGY:  No results found.  EKG:   Orders placed or performed during the hospital encounter of 04/27/16  . ED EKG  . ED EKG  . EKG 12-Lead  . EKG 12-Lead  . EKG    IMPRESSION AND PLAN:   82 year old female history of type 2 diabetes non insulin requiring, ckd baseline Cr 2 presenting with abnormal labs  1. Acute on chronic renal failure: in setting of MPO positive - highly concerning for microscopic polyangiitis. No oral ulcers on exam, will check Ct chest to assess pulmonary involvement - to help determine systemic versus renal only involvement. Pulse steroids - 1gm solumedrol x3 days. Discussed possibilities of renal biopsy, cyclophosphamide/rituximab, possible need for hemodialysis. recheck Ua, nephrology cs 2. Type 2 dm, non insulin requiring: hold oral agents, add sliding scale 3. htn ess: continue home meds 4. gerd without esophagitis: ppi 5. Hyperlipidemia unspecified: statin 6. Vte px: heparin  All the records are reviewed and case discussed with ED provider. Management plans discussed with the patient, family and they are in agreement.  CODE STATUS: full  TOTAL TIME TAKING CARE OF THIS  PATIENT: 45 minutes.    Lang Zingg,  Karenann Cai.D on 05/04/2016 at 1:52 PM  Between 7am to 6pm - Pager - [phone]  After 6pm: House Pager: - [phone]  [facility]  Office  [phone]  CC: Primary care physician; Anthonette Legato, MD

## 2016-05-04 NOTE — Progress Notes (Signed)
[facility]  ROUNDING NOTE   Subjective:   Patient readmitted with worsening renal function and concern for vasculitis. Was seen by my partner this morning.  Red cell casts ANA positive and ANCA positive.   Started on solumedrol 1000mg  IV daily  Objective:  Vital signs in last 24 hours:  Temp:  [97.5 F (36.4 C)] 97.5 F (36.4 C) (08/03 1312) Pulse Rate:  [89] 89 (08/03 1312) Resp:  [19] 19 (08/03 1312) BP: (164)/(70) 164/70 (08/03 1312) SpO2:  [93 %] 93 % (08/03 1312) Weight:  [94.8 kg (209 lb)] 94.8 kg (209 lb) (08/03 1439)  Weight change:  Filed Weights   05/04/16 1439  Weight: 94.8 kg (209 lb)    Intake/Output: No intake/output data recorded.   Intake/Output this shift:  Total I/O In: -  Out: 150 [Urine:150]  Physical Exam: General: NAD, laying in bed  Head: Normocephalic, atraumatic. Moist oral mucosal membranes  Eyes: Anicteric, PERRL  Neck: Supple, trachea midline  Lungs:  Clear to auscultation  Heart: Regular rate and rhythm  Abdomen:  Soft, nontender,   Extremities: no peripheral edema.  Neurologic: Nonfocal, moving all four extremities  Skin: No lesions  GU:  Foley with urine    Basic Metabolic Panel:  Recent Labs Lab 04/29/16 0421 04/30/16 0502 05/01/16 0745 05/02/16 0501 05/04/16 1522  NA 136 134* 135 137 134*  K 3.9 3.8 3.9 3.4* 3.8  CL 102 100* 101 103 101  CO2 28 25 27 27 23   GLUCOSE 87 137* 127* 120* 163*  BUN 54* 56* 55* 59* 57*  CREATININE 4.31* 4.34* 4.35* 4.36* 4.12*  CALCIUM 7.4* 7.5* 7.7* 7.7* 8.0*    Liver Function Tests:  Recent Labs Lab 04/27/16 2306  AST 24  ALT 11*  ALKPHOS 90  BILITOT 0.3  PROT 7.8  ALBUMIN 2.6*   No results for input(s): LIPASE, AMYLASE in the last 168 hours. No results for input(s): AMMONIA in the last 168 hours.  CBC:  Recent Labs Lab 04/27/16 2306 04/29/16 0421 05/04/16 1522  WBC 12.3* 10.0 8.3  NEUTROABS 11.4*  --   --   HGB 8.9* 8.4* 7.6*  HCT 26.6* 24.1*  21.9*  MCV 83.6 82.5 83.6  PLT 228 194 204    Cardiac Enzymes:  Recent Labs Lab 04/27/16 2306  TROPONINI 0.05*    BNP: Invalid input(s): POCBNP  CBG:  Recent Labs Lab 05/02/16 0743 05/02/16 0819 05/02/16 1131 05/04/16 1418 05/04/16 1630  GLUCAP 115* 159* 145* 102* 133*    Microbiology: Results for orders placed or performed during the hospital encounter of 04/27/16  Urine culture     Status: Abnormal   Collection Time: 04/27/16 11:06 PM  Result Value Ref Range Status   Specimen Description URINE, CLEAN CATCH  Final   Special Requests Normal  Final   Culture (A)  Final    <10,000 COLONIES/mL INSIGNIFICANT GROWTH Performed at [facility]    Report Status 04/29/2016 FINAL  Final  MRSA PCR Screening     Status: None   Collection Time: 04/28/16  7:00 PM  Result Value Ref Range Status   MRSA by PCR NEGATIVE NEGATIVE Final    Comment:        The GeneXpert MRSA Assay (FDA approved for NASAL specimens only), is one component of a comprehensive MRSA colonization surveillance program. It is not intended to diagnose MRSA infection nor to guide or monitor treatment for MRSA infections.     Coagulation Studies: No results for input(s): LABPROT,  INR in the last 72 hours.  Urinalysis: No results for input(s): COLORURINE, LABSPEC, PHURINE, GLUCOSEU, HGBUR, BILIRUBINUR, KETONESUR, PROTEINUR, UROBILINOGEN, NITRITE, LEUKOCYTESUR in the last 72 hours.  Invalid input(s): APPERANCEUR    Imaging: Ct Chest Wo Contrast  Result Date: 05/04/2016 CLINICAL DATA:  Nonproductive cough. EXAM: CT CHEST WITHOUT CONTRAST TECHNIQUE: Multidetector CT imaging of the chest was performed following the standard protocol without IV contrast. COMPARISON:  None. FINDINGS: Cardiovascular: Heart is enlarged. Patient is status post CABG. No thoracic aortic aneurysm. Mediastinum/Nodes: Small lymph nodes scattered in the mediastinum do not meet CT criteria for pathologic enlargement. No  evidence for gross hilar lymphadenopathy although assessment is limited by the lack of intravenous contrast on today's study. The esophagus has normal imaging features. There is no axillary lymphadenopathy. Lungs/Pleura: Interlobular septal thickening noted in the upper lobes with patchy ground-glass attenuation having a somewhat mosaic configuration. Atelectasis noted in the lower lobes. No dense focal airspace consolidation. Thickening or fluid is seen along the cranial aspect of the left major fissure. There is dependent atelectasis in the lower lungs bilaterally. Upper Abdomen: Unremarkable. Musculoskeletal: Bone windows reveal no worrisome lytic or sclerotic osseous lesions. IMPRESSION: Interlobular septal thickening with patchy ground-glass attenuation. A component of pulmonary edema is likely although underlying chronic small airway disease could also contribute to this appearance. Bibasilar atelectasis. Confluent soft tissue attenuation in the cranial aspect the left major fissure may represent fluid. Consider repeat CT after resolution of acute symptoms to ensure resolution. Electronically Signed   By: Misty Stanley M.D.   On: 05/04/2016 16:07     Medications:   . sodium chloride     . aspirin  325 mg Oral Daily  . cloNIDine  0.2 mg Oral TID  . heparin  5,000 Units Subcutaneous Q8H  . hydrALAZINE  100 mg Oral Q6H  . insulin aspart  0-5 Units Subcutaneous QHS  . insulin aspart  0-9 Units Subcutaneous TID WC  . [START ON 05/05/2016] levothyroxine  175 mcg Oral QAC breakfast  . methylPREDNISolone (SOLU-MEDROL) injection  1,000 mg Intravenous Q24H  . metoprolol tartrate  50 mg Oral BID   ALPRAZolam, hydrALAZINE, ondansetron **OR** ondansetron (ZOFRAN) IV  Assessment/ Plan:  [name] is a 82 y.o. white female with osteoarthritis, right breast cancer status post radiation therapy, coronary artery disease status post four-vessel CABG, diabetes mellitus type 2, GERD, hyperlipidemia,  hypertension, hypothyroidism who was admitted to [facility] on 04/27/2016 for evaluation of near syncope.   1. Acute renal failure on chronic kidney disease stage IV with proteinuria and hematuria: Positive ANA, ANCA and SPEP/UPEP: also C4 on low end of normal.  - IV solumedrol - Schedule renal biopsy.  - discussed dialysis with patient.   2. Hypertension: well controlled. Holding furosemide.  - clonidine, hydralazine and metoprolol.   3. Anemia of chronic kidney disease.  - low threshold to start epo.    LOS: 0 Rosealyn Little 8/3/20175:19 PM

## 2016-05-05 LAB — SULFONYLUREA HYPOGLYCEMICS PANEL, SERUM
Acetohexamide: NEGATIVE ug/mL (ref 20–60)
Chlorpropamide: NEGATIVE ug/mL (ref 75–250)
Glimepiride: NEGATIVE ng/mL (ref 80–250)
Glipizide: NEGATIVE ng/mL (ref 200–1000)
Glyburide: NEGATIVE ng/mL
Nateglinide: NEGATIVE ng/mL
Repaglinide: NEGATIVE ng/mL
Tolazamide: NEGATIVE ug/mL
Tolbutamide: NEGATIVE ug/mL (ref 40–100)

## 2016-05-05 LAB — BASIC METABOLIC PANEL
Anion gap: 5 (ref 5–15)
BUN: 59 mg/dL — ABNORMAL HIGH (ref 6–20)
CO2: 26 mmol/L (ref 22–32)
Calcium: 7.9 mg/dL — ABNORMAL LOW (ref 8.9–10.3)
Chloride: 103 mmol/L (ref 101–111)
Creatinine, Ser: 4.01 mg/dL — ABNORMAL HIGH (ref 0.44–1.00)
GFR calc Af Amer: 11 mL/min — ABNORMAL LOW (ref >60)
GFR calc non Af Amer: 10 mL/min — ABNORMAL LOW (ref >60)
Glucose, Bld: 216 mg/dL — ABNORMAL HIGH (ref 65–99)
Potassium: 4.3 mmol/L (ref 3.5–5.1)
Sodium: 134 mmol/L — ABNORMAL LOW (ref 135–145)

## 2016-05-05 LAB — GLUCOSE, CAPILLARY
Glucose-Capillary: 219 mg/dL — ABNORMAL HIGH (ref 65–99)
Glucose-Capillary: 274 mg/dL — ABNORMAL HIGH (ref 65–99)
Glucose-Capillary: 151 mg/dL — ABNORMAL HIGH (ref 65–99)
Glucose-Capillary: 262 mg/dL — ABNORMAL HIGH (ref 65–99)

## 2016-05-05 LAB — PROTEIN ELECTRO, RANDOM URINE
Albumin ELP, Urine: 25.3 %
Alpha-1-Globulin, U: 1.5 %
Alpha-2-Globulin, U: 9.1 %
Beta Globulin, U: 22.3 %
Gamma Globulin, U: 41.8 %
M Component, Ur: 31.3 % — ABNORMAL HIGH
Total Protein, Urine: 52.4 mg/dL

## 2016-05-05 NOTE — Progress Notes (Signed)
[facility]  ROUNDING NOTE   Subjective:   Creatinine without much improvement.  1.5 gram of proteinuria. Continues to have proteinuria.  Started on solumedrol 1000mg  IV daily  Objective:  Vital signs in last 24 hours:  Temp:  [97.4 F (36.3 C)-98.2 F (36.8 C)] 97.6 F (36.4 C) (08/04 1412) Pulse Rate:  [81-90] 81 (08/04 1412) Resp:  [18-20] 18 (08/04 1412) BP: (131-164)/(48-80) 164/73 (08/04 1412) SpO2:  [90 %-98 %] 98 % (08/04 1412)  Weight change:  Filed Weights   05/04/16 1439  Weight: 94.8 kg (209 lb)    Intake/Output: I/O last 3 completed shifts: In: 27 [P.O.:222] Out: 1050 [Urine:1050]   Intake/Output this shift:  Total I/O In: 480 [P.O.:480] Out: 0   Physical Exam: General: NAD, laying in bed  Head: Normocephalic, atraumatic. Moist oral mucosal membranes  Eyes: Anicteric, PERRL  Neck: Supple, trachea midline  Lungs:  Clear to auscultation  Heart: Regular rate and rhythm  Abdomen:  Soft, nontender,   Extremities: no peripheral edema.  Neurologic: Nonfocal, moving all four extremities  Skin: No lesions  GU:  Foley with urine    Basic Metabolic Panel:  Recent Labs Lab 04/30/16 0502 05/01/16 0745 05/02/16 0501 05/04/16 1522 05/05/16 0519  NA 134* 135 137 134* 134*  K 3.8 3.9 3.4* 3.8 4.3  CL 100* 101 103 101 103  CO2 25 27 27 23 26   GLUCOSE 137* 127* 120* 163* 216*  BUN 56* 55* 59* 57* 59*  CREATININE 4.34* 4.35* 4.36* 4.12* 4.01*  CALCIUM 7.5* 7.7* 7.7* 8.0* 7.9*    Liver Function Tests: No results for input(s): AST, ALT, ALKPHOS, BILITOT, PROT, ALBUMIN in the last 168 hours. No results for input(s): LIPASE, AMYLASE in the last 168 hours. No results for input(s): AMMONIA in the last 168 hours.  CBC:  Recent Labs Lab 04/29/16 0421 05/04/16 1522  WBC 10.0 8.3  HGB 8.4* 7.6*  HCT 24.1* 21.9*  MCV 82.5 83.6  PLT 194 204    Cardiac Enzymes: No results for input(s): CKTOTAL, CKMB, CKMBINDEX, TROPONINI in the last  168 hours.  BNP: Invalid input(s): POCBNP  CBG:  Recent Labs Lab 05/04/16 1418 05/04/16 1630 05/04/16 2106 05/05/16 0754 05/05/16 1138  GLUCAP 102* 133* 272* 219* 274*    Microbiology: Results for orders placed or performed during the hospital encounter of 04/27/16  Urine culture     Status: Abnormal   Collection Time: 04/27/16 11:06 PM  Result Value Ref Range Status   Specimen Description URINE, CLEAN CATCH  Final   Special Requests Normal  Final   Culture (A)  Final    <10,000 COLONIES/mL INSIGNIFICANT GROWTH Performed at [facility]    Report Status 04/29/2016 FINAL  Final  MRSA PCR Screening     Status: None   Collection Time: 04/28/16  7:00 PM  Result Value Ref Range Status   MRSA by PCR NEGATIVE NEGATIVE Final    Comment:        The GeneXpert MRSA Assay (FDA approved for NASAL specimens only), is one component of a comprehensive MRSA colonization surveillance program. It is not intended to diagnose MRSA infection nor to guide or monitor treatment for MRSA infections.     Coagulation Studies: No results for input(s): LABPROT, INR in the last 72 hours.  Urinalysis:  Recent Labs  05/04/16 1850  COLORURINE YELLOW*  LABSPEC 1.010  PHURINE 5.0  GLUCOSEU NEGATIVE  HGBUR 3+*  BILIRUBINUR NEGATIVE  KETONESUR NEGATIVE  PROTEINUR 100*  NITRITE  NEGATIVE  LEUKOCYTESUR NEGATIVE      Imaging: Ct Chest Wo Contrast  Result Date: 05/04/2016 CLINICAL DATA:  Nonproductive cough. EXAM: CT CHEST WITHOUT CONTRAST TECHNIQUE: Multidetector CT imaging of the chest was performed following the standard protocol without IV contrast. COMPARISON:  None. FINDINGS: Cardiovascular: Heart is enlarged. Patient is status post CABG. No thoracic aortic aneurysm. Mediastinum/Nodes: Small lymph nodes scattered in the mediastinum do not meet CT criteria for pathologic enlargement. No evidence for gross hilar lymphadenopathy although assessment is limited by the lack of  intravenous contrast on today's study. The esophagus has normal imaging features. There is no axillary lymphadenopathy. Lungs/Pleura: Interlobular septal thickening noted in the upper lobes with patchy ground-glass attenuation having a somewhat mosaic configuration. Atelectasis noted in the lower lobes. No dense focal airspace consolidation. Thickening or fluid is seen along the cranial aspect of the left major fissure. There is dependent atelectasis in the lower lungs bilaterally. Upper Abdomen: Unremarkable. Musculoskeletal: Bone windows reveal no worrisome lytic or sclerotic osseous lesions. IMPRESSION: Interlobular septal thickening with patchy ground-glass attenuation. A component of pulmonary edema is likely although underlying chronic small airway disease could also contribute to this appearance. Bibasilar atelectasis. Confluent soft tissue attenuation in the cranial aspect the left major fissure may represent fluid. Consider repeat CT after resolution of acute symptoms to ensure resolution. Electronically Signed   By: Misty Stanley M.D.   On: 05/04/2016 16:07   Dg Chest Port 1 View  Result Date: 05/04/2016 CLINICAL DATA:  Status post PICC placement. EXAM: PORTABLE CHEST 1 VIEW COMPARISON:  05/01/2016 and chest CTA obtained earlier today. FINDINGS: Stable enlarged cardiac silhouette post CABG changes. Interval left PICC with its tip in the superior vena cava. Mildly prominent interstitial markings with slight improvement. No definite pleural fluid. Diffuse osteopenia. IMPRESSION: 1. Left PICC tip in the superior vena cava. If a position at the superior cavoatrial junction is desired, it would be recommended that this be advanced 4 cm. 2. Further improvement in interstitial pulmonary edema. 3. Stable cardiomegaly Electronically Signed   By: Claudie Revering M.D.   On: 05/04/2016 17:24     Medications:     . aspirin  325 mg Oral Daily  . cloNIDine  0.2 mg Oral TID  . heparin  5,000 Units Subcutaneous  Q8H  . hydrALAZINE  100 mg Oral Q6H  . insulin aspart  0-5 Units Subcutaneous QHS  . insulin aspart  0-9 Units Subcutaneous TID WC  . levothyroxine  175 mcg Oral QAC breakfast  . methylPREDNISolone (SOLU-MEDROL) injection  1,000 mg Intravenous Q24H  . metoprolol tartrate  50 mg Oral BID   ALPRAZolam, hydrALAZINE, ondansetron **OR** ondansetron (ZOFRAN) IV  Assessment/ Plan:  [name] is a 82 y.o. white female with osteoarthritis, right breast cancer status post radiation therapy, coronary artery disease status post four-vessel CABG, diabetes mellitus type 2, GERD, hyperlipidemia, hypertension, hypothyroidism who was admitted to [facility] on 04/27/2016 for evaluation of near syncope.   1. Acute renal failure on chronic kidney disease stage IV with proteinuria and hematuria: Positive ANA, ANCA and SPEP/UPEP: also C4 on low end of normal.  - IV solumedrol - Schedule renal biopsy for Monday 8/7. Radiologist to do.  - discussed dialysis with patient.   2. Hypertension: some elevations Holding furosemide.  - clonidine, hydralazine and metoprolol.   3. Anemia of chronic kidney disease.  - low threshold to start epo.    LOS: [place], Nisha Dhami 8/4/20173:34 PM

## 2016-05-05 NOTE — Progress Notes (Signed)
Inpatient Diabetes Program Recommendations  AACE/ADA: New Consensus Statement on Inpatient Glycemic Control (2015)  Target Ranges:  Prepandial:   less than 140 mg/dL      Peak postprandial:   less than 180 mg/dL (1-2 hours)      Critically ill patients:  140 - 180 mg/dL   Lab Results  Component Value Date   GLUCAP 219 (H) 05/05/2016   HGBA1C  04/27/2016    UNABLE TO REPORT A1C DUE TO UNKNOWN INTERFERING FACTOR CAUSING THE ANALYTICAL RANGE TO BE OUTSIDE OF ANALYZER RANGE.  SAMPLE SENT TO [facility] FOR AN ALTERNATIVE MEHTOD    Review of Glycemic Control  Results for [name], [name] (MRN [id number]) as of 05/05/2016 08:37  Ref. Range 05/04/2016 14:18 05/04/2016 16:30 05/04/2016 21:06 05/05/2016 07:54  Glucose-Capillary Latest Ref Range: 65 - 99 mg/dL 102 (H) 133 (H) 272 (H) 219 (H)    Diabetes history: Type 2 Outpatient Diabetes medications: none noted Current orders for Inpatient glycemic control: Novolog 0-9 units tid, Novolog 0-5 units qhs  Inpatient Diabetes Program Recommendations:  Agree with current orders for blood sugar management.  Gentry Fitz, RN, BA, MHA, CDE Diabetes Coordinator Inpatient Diabetes Program  [phone] (Team Pager) [phone] ([place]) 05/05/2016 8:39 AM

## 2016-05-05 NOTE — Progress Notes (Signed)
[place] at [place] NAME: [name]    MR#:  [id number]  DATE OF BIRTH:  1934/02/15  SUBJECTIVE:  CHIEF COMPLAINT:  Patient is resting comfortably. No complaints.  REVIEW OF SYSTEMS:  CONSTITUTIONAL: No fever, fatigue or weakness.  EYES: No blurred or double vision.  EARS, NOSE, AND THROAT: No tinnitus or ear pain.  RESPIRATORY: No cough, shortness of breath, wheezing or hemoptysis.  CARDIOVASCULAR: No chest pain, orthopnea, edema.  GASTROINTESTINAL: No nausea, vomiting, diarrhea or abdominal pain.  GENITOURINARY: No dysuria, hematuria.  ENDOCRINE: No polyuria, nocturia,  HEMATOLOGY: No anemia, easy bruising or bleeding SKIN: No rash or lesion. MUSCULOSKELETAL: No joint pain or arthritis.   NEUROLOGIC: No tingling, numbness, weakness.  PSYCHIATRY: No anxiety or depression.   DRUG ALLERGIES:   Allergies  Allergen Reactions  . Ciprocinonide [Fluocinolone]     Feels crazy  . Darvocet [Propoxyphene N-Acetaminophen]     Difficulty breathing and swallowing  . Duratuss G [Guaifenesin]     Other reaction(s): Unknown  . Ivp Dye [Iodinated Diagnostic Agents]     Nausea & vomiting  . Ketek  [Telithromycin]     Other reaction(s): Unknown  . Levaquin  [Levofloxacin] Nausea Only  . Other     Reports all cholesterol medication  . Statins     Muscle aches    VITALS:  Blood pressure (!) 164/73, pulse 81, temperature 97.6 F (36.4 C), temperature source Oral, resp. rate 18, height 5\' 5"  (1.651 m), weight 94.8 kg (209 lb), SpO2 98 %.  PHYSICAL EXAMINATION:  GENERAL:  82 y.o.-year-old patient lying in the bed with no acute distress.  EYES: Pupils equal, round, reactive to light and accommodation. No scleral icterus. Extraocular muscles intact.  HEENT: Head atraumatic, normocephalic. Oropharynx and nasopharynx clear.  NECK:  Supple, no jugular venous distention. No thyroid enlargement, no tenderness.  LUNGS: Normal breath  sounds bilaterally, no wheezing, rales,rhonchi or crepitation. No use of accessory muscles of respiration.  CARDIOVASCULAR: S1, S2 normal. No murmurs, rubs, or gallops.  ABDOMEN: Soft, nontender, nondistended. Bowel sounds present. No organomegaly or mass.  EXTREMITIES: No pedal edema, cyanosis, or clubbing.  NEUROLOGIC: Cranial nerves II through XII are intact. Muscle strength 5/5 in all extremities. Sensation intact. Gait not checked.  PSYCHIATRIC: The patient is alert and oriented x 3.  SKIN: No obvious rash, lesion, or ulcer.    LABORATORY PANEL:   CBC  Recent Labs Lab 05/04/16 1522  WBC 8.3  HGB 7.6*  HCT 21.9*  PLT 204   ------------------------------------------------------------------------------------------------------------------  Chemistries   Recent Labs Lab 05/05/16 0519  NA 134*  K 4.3  CL 103  CO2 26  GLUCOSE 216*  BUN 59*  CREATININE 4.01*  CALCIUM 7.9*   ------------------------------------------------------------------------------------------------------------------  Cardiac Enzymes No results for input(s): TROPONINI in the last 168 hours. ------------------------------------------------------------------------------------------------------------------  RADIOLOGY:  Ct Chest Wo Contrast  Result Date: 05/04/2016 CLINICAL DATA:  Nonproductive cough. EXAM: CT CHEST WITHOUT CONTRAST TECHNIQUE: Multidetector CT imaging of the chest was performed following the standard protocol without IV contrast. COMPARISON:  None. FINDINGS: Cardiovascular: Heart is enlarged. Patient is status post CABG. No thoracic aortic aneurysm. Mediastinum/Nodes: Small lymph nodes scattered in the mediastinum do not meet CT criteria for pathologic enlargement. No evidence for gross hilar lymphadenopathy although assessment is limited by the lack of intravenous contrast on today's study. The esophagus has normal imaging features. There is no axillary lymphadenopathy. Lungs/Pleura:  Interlobular septal thickening noted in the upper lobes with  patchy ground-glass attenuation having a somewhat mosaic configuration. Atelectasis noted in the lower lobes. No dense focal airspace consolidation. Thickening or fluid is seen along the cranial aspect of the left major fissure. There is dependent atelectasis in the lower lungs bilaterally. Upper Abdomen: Unremarkable. Musculoskeletal: Bone windows reveal no worrisome lytic or sclerotic osseous lesions. IMPRESSION: Interlobular septal thickening with patchy ground-glass attenuation. A component of pulmonary edema is likely although underlying chronic small airway disease could also contribute to this appearance. Bibasilar atelectasis. Confluent soft tissue attenuation in the cranial aspect the left major fissure may represent fluid. Consider repeat CT after resolution of acute symptoms to ensure resolution. Electronically Signed   By: Misty Stanley M.D.   On: 05/04/2016 16:07   Dg Chest Port 1 View  Result Date: 05/04/2016 CLINICAL DATA:  Status post PICC placement. EXAM: PORTABLE CHEST 1 VIEW COMPARISON:  05/01/2016 and chest CTA obtained earlier today. FINDINGS: Stable enlarged cardiac silhouette post CABG changes. Interval left PICC with its tip in the superior vena cava. Mildly prominent interstitial markings with slight improvement. No definite pleural fluid. Diffuse osteopenia. IMPRESSION: 1. Left PICC tip in the superior vena cava. If a position at the superior cavoatrial junction is desired, it would be recommended that this be advanced 4 cm. 2. Further improvement in interstitial pulmonary edema. 3. Stable cardiomegaly Electronically Signed   By: Claudie Revering M.D.   On: 05/04/2016 17:24    EKG:   Orders placed or performed during the hospital encounter of 04/27/16  . ED EKG  . ED EKG  . EKG 12-Lead  . EKG 12-Lead  . EKG    ASSESSMENT AND PLAN:    82 year old female history of type 2 diabetes non insulin requiring, ckd baseline  Cr 2 presenting with abnormal labs  1. Acute on chronic renal failure: in setting of ANA and ANCA  positive and SPEP/UPEP positive - highly concerning for microscopic polyangiitis. No oral ulcers on exam,  ivf for now Ct chest With groundglass attenuation and chronic airway disease -needs outpatient pulmonology follow-up   Pulse steroids - 1gm solumedrol x3 days.  Discussed possibilities of renal biopsy,  possible need for hemodialysis.  Appreciate nephro recommendations  2. Type 2 dm, non insulin requiring: hold oral agents, add sliding scale  3. htn ess: continue home meds  4. gerd without esophagitis: ppi  5. Hyperlipidemia unspecified: statin  6. Vte px: heparin   All the records are reviewed and case discussed with Care Management/Social Workerr. Management plans discussed with the patient, family and they are in agreement.  CODE STATUS: fc   TOTAL TIME TAKING CARE OF THIS PATIENT:  minutes.   POSSIBLE D/C IN 3  DAYS, DEPENDING ON CLINICAL CONDITION.  Note: This dictation was prepared with Dragon dictation along with smaller phrase technology. Any transcriptional errors that result from this process are unintentional.   Nicholes Mango M.D on 05/05/2016 at 4:09 PM  Between 7am to 6pm - Pager - [phone] After 6pm go to [email or web address] - password [email or web address] [facility]  Office  [phone]  CC: Primary care physician; Anthonette Legato, MD

## 2016-05-05 NOTE — Care Management (Signed)
Notified [facility] dialysis liaison that patient is being monitored for need of HD

## 2016-05-05 NOTE — OR Nursing (Signed)
Dr Pascal Lux notified of planned kindey biopsy on Monday to be doe by the IR MD's. Informed of cbc abnormals, coags and cbc to be drawn Monday am prior to procedure.

## 2016-05-06 LAB — BASIC METABOLIC PANEL
Anion gap: 8 (ref 5–15)
BUN: 68 mg/dL — ABNORMAL HIGH (ref 6–20)
CO2: 25 mmol/L (ref 22–32)
Calcium: 8.1 mg/dL — ABNORMAL LOW (ref 8.9–10.3)
Chloride: 101 mmol/L (ref 101–111)
Creatinine, Ser: 3.84 mg/dL — ABNORMAL HIGH (ref 0.44–1.00)
GFR calc Af Amer: 12 mL/min — ABNORMAL LOW (ref >60)
GFR calc non Af Amer: 10 mL/min — ABNORMAL LOW (ref >60)
Glucose, Bld: 212 mg/dL — ABNORMAL HIGH (ref 65–99)
Potassium: 4.2 mmol/L (ref 3.5–5.1)
Sodium: 134 mmol/L — ABNORMAL LOW (ref 135–145)

## 2016-05-06 LAB — GLUCOSE, CAPILLARY
Glucose-Capillary: 202 mg/dL — ABNORMAL HIGH (ref 65–99)
Glucose-Capillary: 226 mg/dL — ABNORMAL HIGH (ref 65–99)
Glucose-Capillary: 205 mg/dL — ABNORMAL HIGH (ref 65–99)
Glucose-Capillary: 237 mg/dL — ABNORMAL HIGH (ref 65–99)

## 2016-05-06 LAB — ENA+DNA/DS+SJORGEN'S
ENA SM Ab Ser-aCnc: <0.2 AI (ref 0.0–0.9)
Ribonucleic Protein: 1.1 AI — ABNORMAL HIGH (ref 0.0–0.9)
SSA (Ro) (ENA) Antibody, IgG: <0.2 AI (ref 0.0–0.9)
SSB (La) (ENA) Antibody, IgG: <0.2 AI (ref 0.0–0.9)
ds DNA Ab: 15 IU/mL — ABNORMAL HIGH (ref 0–9)

## 2016-05-06 LAB — ANA W/REFLEX: Anti Nuclear Antibody(ANA): POSITIVE — AB

## 2016-05-06 LAB — MPO/PR-3 (ANCA) ANTIBODIES
ANCA Proteinase 3: 3.7 U/mL — ABNORMAL HIGH (ref 0.0–3.5)
Myeloperoxidase Abs: 47.6 U/mL — ABNORMAL HIGH (ref 0.0–9.0)

## 2016-05-06 NOTE — Progress Notes (Signed)
[facility]  ROUNDING NOTE   Subjective:   Creatinine 3.84 (4.01)   Objective:  Vital signs in last 24 hours:  Temp:  [97.6 F (36.4 C)-97.8 F (36.6 C)] 97.6 F (36.4 C) (08/05 0532) Pulse Rate:  [81-84] 84 (08/05 0532) Resp:  [18-20] 20 (08/05 0532) BP: (146-173)/(60-76) 173/76 (08/05 0532) SpO2:  [96 %-98 %] 96 % (08/05 0532)  Weight change:  Filed Weights   05/04/16 1439  Weight: 94.8 kg (209 lb)    Intake/Output: I/O last 3 completed shifts: In: 480 [P.O.:480] Out: 1500 [Urine:1500]   Intake/Output this shift:  Total I/O In: 150 [P.O.:150] Out: -   Physical Exam: General: NAD, laying in bed  Head: Normocephalic, atraumatic. Moist oral mucosal membranes  Eyes: Anicteric, PERRL  Neck: Supple, trachea midline  Lungs:  Clear to auscultation  Heart: Regular rate and rhythm  Abdomen:  Soft, nontender,   Extremities: no peripheral edema.  Neurologic: Nonfocal, moving all four extremities  Skin: No lesions  GU:  Foley with urine    Basic Metabolic Panel:  Recent Labs Lab 05/01/16 0745 05/02/16 0501 05/04/16 1522 05/05/16 0519 05/06/16 0550  NA 135 137 134* 134* 134*  K 3.9 3.4* 3.8 4.3 4.2  CL 101 103 101 103 101  CO2 27 27 23 26 25   GLUCOSE 127* 120* 163* 216* 212*  BUN 55* 59* 57* 59* 68*  CREATININE 4.35* 4.36* 4.12* 4.01* 3.84*  CALCIUM 7.7* 7.7* 8.0* 7.9* 8.1*    Liver Function Tests: No results for input(s): AST, ALT, ALKPHOS, BILITOT, PROT, ALBUMIN in the last 168 hours. No results for input(s): LIPASE, AMYLASE in the last 168 hours. No results for input(s): AMMONIA in the last 168 hours.  CBC:  Recent Labs Lab 05/04/16 1522  WBC 8.3  HGB 7.6*  HCT 21.9*  MCV 83.6  PLT 204    Cardiac Enzymes: No results for input(s): CKTOTAL, CKMB, CKMBINDEX, TROPONINI in the last 168 hours.  BNP: Invalid input(s): POCBNP  CBG:  Recent Labs Lab 05/05/16 0754 05/05/16 1138 05/05/16 1652 05/05/16 2125 05/06/16 0745   GLUCAP 219* 274* 151* 59* 202*    Microbiology: Results for orders placed or performed during the hospital encounter of 04/27/16  Urine culture     Status: Abnormal   Collection Time: 04/27/16 11:06 PM  Result Value Ref Range Status   Specimen Description URINE, CLEAN CATCH  Final   Special Requests Normal  Final   Culture (A)  Final    <10,000 COLONIES/mL INSIGNIFICANT GROWTH Performed at [facility]    Report Status 04/29/2016 FINAL  Final  MRSA PCR Screening     Status: None   Collection Time: 04/28/16  7:00 PM  Result Value Ref Range Status   MRSA by PCR NEGATIVE NEGATIVE Final    Comment:        The GeneXpert MRSA Assay (FDA approved for NASAL specimens only), is one component of a comprehensive MRSA colonization surveillance program. It is not intended to diagnose MRSA infection nor to guide or monitor treatment for MRSA infections.     Coagulation Studies: No results for input(s): LABPROT, INR in the last 72 hours.  Urinalysis:  Recent Labs  05/04/16 1850  COLORURINE YELLOW*  LABSPEC 1.010  PHURINE 5.0  GLUCOSEU NEGATIVE  HGBUR 3+*  BILIRUBINUR NEGATIVE  KETONESUR NEGATIVE  PROTEINUR 100*  NITRITE NEGATIVE  LEUKOCYTESUR NEGATIVE      Imaging: Ct Chest Wo Contrast  Result Date: 05/04/2016 CLINICAL DATA:  Nonproductive cough.  EXAM: CT CHEST WITHOUT CONTRAST TECHNIQUE: Multidetector CT imaging of the chest was performed following the standard protocol without IV contrast. COMPARISON:  None. FINDINGS: Cardiovascular: Heart is enlarged. Patient is status post CABG. No thoracic aortic aneurysm. Mediastinum/Nodes: Small lymph nodes scattered in the mediastinum do not meet CT criteria for pathologic enlargement. No evidence for gross hilar lymphadenopathy although assessment is limited by the lack of intravenous contrast on today's study. The esophagus has normal imaging features. There is no axillary lymphadenopathy. Lungs/Pleura: Interlobular septal  thickening noted in the upper lobes with patchy ground-glass attenuation having a somewhat mosaic configuration. Atelectasis noted in the lower lobes. No dense focal airspace consolidation. Thickening or fluid is seen along the cranial aspect of the left major fissure. There is dependent atelectasis in the lower lungs bilaterally. Upper Abdomen: Unremarkable. Musculoskeletal: Bone windows reveal no worrisome lytic or sclerotic osseous lesions. IMPRESSION: Interlobular septal thickening with patchy ground-glass attenuation. A component of pulmonary edema is likely although underlying chronic small airway disease could also contribute to this appearance. Bibasilar atelectasis. Confluent soft tissue attenuation in the cranial aspect the left major fissure may represent fluid. Consider repeat CT after resolution of acute symptoms to ensure resolution. Electronically Signed   By: Misty Stanley M.D.   On: 05/04/2016 16:07   Dg Chest Port 1 View  Result Date: 05/04/2016 CLINICAL DATA:  Status post PICC placement. EXAM: PORTABLE CHEST 1 VIEW COMPARISON:  05/01/2016 and chest CTA obtained earlier today. FINDINGS: Stable enlarged cardiac silhouette post CABG changes. Interval left PICC with its tip in the superior vena cava. Mildly prominent interstitial markings with slight improvement. No definite pleural fluid. Diffuse osteopenia. IMPRESSION: 1. Left PICC tip in the superior vena cava. If a position at the superior cavoatrial junction is desired, it would be recommended that this be advanced 4 cm. 2. Further improvement in interstitial pulmonary edema. 3. Stable cardiomegaly Electronically Signed   By: Claudie Revering M.D.   On: 05/04/2016 17:24     Medications:     . cloNIDine  0.2 mg Oral TID  . heparin  5,000 Units Subcutaneous Q8H  . hydrALAZINE  100 mg Oral Q6H  . insulin aspart  0-5 Units Subcutaneous QHS  . insulin aspart  0-9 Units Subcutaneous TID WC  . levothyroxine  175 mcg Oral QAC breakfast  .  methylPREDNISolone (SOLU-MEDROL) injection  1,000 mg Intravenous Q24H  . metoprolol tartrate  50 mg Oral BID   ALPRAZolam, hydrALAZINE, ondansetron **OR** ondansetron (ZOFRAN) IV  Assessment/ Plan:  Ms. IRELAND FOLLMAN is a 82 y.o. white female with osteoarthritis, right breast cancer status post radiation therapy, coronary artery disease status post four-vessel CABG, diabetes mellitus type 2, GERD, hyperlipidemia, hypertension, hypothyroidism who was admitted to [facility] on 04/27/2016 for evaluation of near syncope.   1. Acute renal failure on chronic kidney disease stage IV with proteinuria and hematuria: Positive ANA, ANCA and SPEP/UPEP: also C4 on low end of normal.  - IV solumedrol - Schedule renal biopsy for Monday 8/7. Radiologist to do.  - discussed dialysis with patient. Nonoliguric with improving creatinine. No acute indication for dialysis.   2. Hypertension: some elevations Holding furosemide. Not on an ACE-I/ARB as outaptient.  - clonidine, hydralazine and metoprolol.   3. Anemia of chronic kidney disease.  - low threshold to start epo.    LOS: 2 Gaelen Brager 8/5/201711:25 AM

## 2016-05-06 NOTE — Progress Notes (Signed)
[place] at [place] NAME: Keba Floresca    MR#:  [id number]  DATE OF BIRTH:  January 16, 1934  SUBJECTIVE:  CHIEF COMPLAINT:  Patient is resting comfortably. No complaints.  REVIEW OF SYSTEMS:  CONSTITUTIONAL: No fever, fatigue or weakness.  EYES: No blurred or double vision.  EARS, NOSE, AND THROAT: No tinnitus or ear pain.  RESPIRATORY: No cough, shortness of breath, wheezing or hemoptysis.  CARDIOVASCULAR: No chest pain, orthopnea, edema.  GASTROINTESTINAL: No nausea, vomiting, diarrhea or abdominal pain.  GENITOURINARY: No dysuria, hematuria.  ENDOCRINE: No polyuria, nocturia,  HEMATOLOGY: No anemia, easy bruising or bleeding SKIN: No rash or lesion. MUSCULOSKELETAL: No joint pain or arthritis.   NEUROLOGIC: No tingling, numbness, weakness.  PSYCHIATRY: No anxiety or depression.   DRUG ALLERGIES:   Allergies  Allergen Reactions  . Ciprocinonide [Fluocinolone]     Feels crazy  . Darvocet [Propoxyphene N-Acetaminophen]     Difficulty breathing and swallowing  . Duratuss G [Guaifenesin]     Other reaction(s): Unknown  . Ivp Dye [Iodinated Diagnostic Agents]     Nausea & vomiting  . Ketek  [Telithromycin]     Other reaction(s): Unknown  . Levaquin  [Levofloxacin] Nausea Only  . Other     Reports all cholesterol medication  . Statins     Muscle aches    VITALS:  Blood pressure (!) 168/66, pulse 81, temperature 97.5 F (36.4 C), temperature source Oral, resp. rate 18, height 5\' 5"  (1.651 m), weight 94.8 kg (209 lb), SpO2 95 %.  PHYSICAL EXAMINATION:  GENERAL:  82 y.o.-year-old patient lying in the bed with no acute distress.  EYES: Pupils equal, round, reactive to light and accommodation. No scleral icterus. Extraocular muscles intact.  HEENT: Head atraumatic, normocephalic. Oropharynx and nasopharynx clear.  NECK:  Supple, no jugular venous distention. No thyroid enlargement, no tenderness.  LUNGS: Normal breath  sounds bilaterally, no wheezing, rales,rhonchi or crepitation. No use of accessory muscles of respiration.  CARDIOVASCULAR: S1, S2 normal. No murmurs, rubs, or gallops.  ABDOMEN: Soft, nontender, nondistended. Bowel sounds present. No organomegaly or mass.  EXTREMITIES: No pedal edema, cyanosis, or clubbing.  NEUROLOGIC: Cranial nerves II through XII are intact. Muscle strength 5/5 in all extremities. Sensation intact. Gait not checked.  PSYCHIATRIC: The patient is alert and oriented x 3.  SKIN: No obvious rash, lesion, or ulcer.    LABORATORY PANEL:   CBC  Recent Labs Lab 05/04/16 1522  WBC 8.3  HGB 7.6*  HCT 21.9*  PLT 204   ------------------------------------------------------------------------------------------------------------------  Chemistries   Recent Labs Lab 05/06/16 0550  NA 134*  K 4.2  CL 101  CO2 25  GLUCOSE 212*  BUN 68*  CREATININE 3.84*  CALCIUM 8.1*   ------------------------------------------------------------------------------------------------------------------  Cardiac Enzymes No results for input(s): TROPONINI in the last 168 hours. ------------------------------------------------------------------------------------------------------------------  RADIOLOGY:  No results found.  EKG:   Orders placed or performed during the hospital encounter of 04/27/16  . ED EKG  . ED EKG  . EKG 12-Lead  . EKG 12-Lead  . EKG    ASSESSMENT AND PLAN:    82 year old female history of type 2 diabetes non insulin requiring, ckd baseline Cr 2 presenting with abnormal labs  1. Acute on chronic renal failure: in setting of ANA and ANCA  positive and SPEP/UPEP positive -  S/p ivf BUN- 59 - 68    , Cr - 4.01-3.84 Pulse steroids - 1gm solumedrol x3 days.  Ct chest  With groundglass attenuation and chronic airway disease -needs outpatient pulmonology follow-up    Scheduled US guided renal biopsy on Mon by radiologist  possible need for hemodialysis.   Appreciate nephro recommendations  2. Type 2 dm, non insulin requiring: hold oral agents, add sliding scale  3. htn ess: continue home meds metoprolol, clonidine and hydralazine Hold lasix  4. gerd without esophagitis: ppi  5. Hyperlipidemia unspecified: statin  6. Hypothyroidism - continue synthyroid   Vte px: heparin   All the records are reviewed and case discussed with Care Management/Social Workerr. Management plans discussed with the patient, family and they are in agreement.  CODE STATUS: fc   TOTAL TIME TAKING CARE OF THIS PATIENT:  minutes.   POSSIBLE D/C IN 2-3  DAYS, DEPENDING ON CLINICAL CONDITION.  Note: This dictation was prepared with Dragon dictation along with smaller phrase technology. Any transcriptional errors that result from this process are unintentional.   Nicholes Mango M.D on 05/06/2016 at 6:00 PM  Between 7am to 6pm - Pager - [phone] After 6pm go to [email or web address] - password [email or web address] [facility]  Office  [phone]  CC: Primary care physician; Anthonette Legato, MD

## 2016-05-07 LAB — BASIC METABOLIC PANEL
Anion gap: 7 (ref 5–15)
BUN: 85 mg/dL — ABNORMAL HIGH (ref 6–20)
CO2: 26 mmol/L (ref 22–32)
Calcium: 8.2 mg/dL — ABNORMAL LOW (ref 8.9–10.3)
Chloride: 102 mmol/L (ref 101–111)
Creatinine, Ser: 3.69 mg/dL — ABNORMAL HIGH (ref 0.44–1.00)
GFR calc Af Amer: 12 mL/min — ABNORMAL LOW (ref >60)
GFR calc non Af Amer: 11 mL/min — ABNORMAL LOW (ref >60)
Glucose, Bld: 224 mg/dL — ABNORMAL HIGH (ref 65–99)
Potassium: 4.2 mmol/L (ref 3.5–5.1)
Sodium: 135 mmol/L (ref 135–145)

## 2016-05-07 LAB — GLUCOSE, CAPILLARY
Glucose-Capillary: 212 mg/dL — ABNORMAL HIGH (ref 65–99)
Glucose-Capillary: 211 mg/dL — ABNORMAL HIGH (ref 65–99)
Glucose-Capillary: 218 mg/dL — ABNORMAL HIGH (ref 65–99)
Glucose-Capillary: 309 mg/dL — ABNORMAL HIGH (ref 65–99)

## 2016-05-07 LAB — PROINSULIN/INSULIN RATIO
Insulin: 62 u[IU]/mL
Proinsulin/Insulin Ratio: 107 %
Proinsulin: 443 pmol/L

## 2016-05-07 MED ORDER — SODIUM CHLORIDE 0.9 % IV SOLN
500.0000 mg | INTRAVENOUS | Status: AC
  Administered 2016-05-07 – 2016-05-09 (×3): 500 mg via INTRAVENOUS
  Filled 2016-05-07 (×4): qty 4

## 2016-05-07 MED ORDER — AMLODIPINE BESYLATE 10 MG PO TABS
10.0000 mg | ORAL_TABLET | Freq: Every day | ORAL | Status: DC
  Administered 2016-05-07 – 2016-05-09 (×3): 10 mg via ORAL
  Filled 2016-05-07 (×3): qty 1

## 2016-05-07 NOTE — Progress Notes (Signed)
[facility]  ROUNDING NOTE   Subjective:   Creatinine 3.69 (3.84) (4.01)  Husband at bedside.   UOP 1600  Objective:  Vital signs in last 24 hours:  Temp:  [97.5 F (36.4 C)-98 F (36.7 C)] 97.9 F (36.6 C) (08/06 0457) Pulse Rate:  [70-85] 70 (08/06 0626) Resp:  [18-20] 20 (08/06 0457) BP: (135-183)/(58-77) 161/65 (08/06 0626) SpO2:  [92 %-96 %] 96 % (08/06 0626)  Weight change:  Filed Weights   05/04/16 1439  Weight: 94.8 kg (209 lb)    Intake/Output: I/O last 3 completed shifts: In: 21 [P.O.:630] Out: 2400 [Urine:2400]   Intake/Output this shift:  Total I/O In: 240 [P.O.:240] Out: 0   Physical Exam: General: NAD, laying in bed  Head: Normocephalic, atraumatic. Moist oral mucosal membranes  Eyes: Anicteric, PERRL  Neck: Supple, trachea midline  Lungs:  Clear to auscultation  Heart: Regular rate and rhythm  Abdomen:  Soft, nontender,   Extremities: no peripheral edema.  Neurologic: Nonfocal, moving all four extremities  Skin: No lesions  GU:  Foley with urine    Basic Metabolic Panel:  Recent Labs Lab 05/02/16 0501 05/04/16 1522 05/05/16 0519 05/06/16 0550 05/07/16 0456  NA 137 134* 134* 134* 135  K 3.4* 3.8 4.3 4.2 4.2  CL 103 101 103 101 102  CO2 27 23 26 25 26   GLUCOSE 120* 163* 216* 212* 224*  BUN 59* 57* 59* 68* 85*  CREATININE 4.36* 4.12* 4.01* 3.84* 3.69*  CALCIUM 7.7* 8.0* 7.9* 8.1* 8.2*    Liver Function Tests: No results for input(s): AST, ALT, ALKPHOS, BILITOT, PROT, ALBUMIN in the last 168 hours. No results for input(s): LIPASE, AMYLASE in the last 168 hours. No results for input(s): AMMONIA in the last 168 hours.  CBC:  Recent Labs Lab 05/04/16 1522  WBC 8.3  HGB 7.6*  HCT 21.9*  MCV 83.6  PLT 204    Cardiac Enzymes: No results for input(s): CKTOTAL, CKMB, CKMBINDEX, TROPONINI in the last 168 hours.  BNP: Invalid input(s): POCBNP  CBG:  Recent Labs Lab 05/06/16 1145 05/06/16 1614  05/06/16 2128 05/07/16 0726 05/07/16 1131  GLUCAP 226* 205* 237* 212* 211*    Microbiology: Results for orders placed or performed during the hospital encounter of 04/27/16  Urine culture     Status: Abnormal   Collection Time: 04/27/16 11:06 PM  Result Value Ref Range Status   Specimen Description URINE, CLEAN CATCH  Final   Special Requests Normal  Final   Culture (A)  Final    <10,000 COLONIES/mL INSIGNIFICANT GROWTH Performed at [facility]    Report Status 04/29/2016 FINAL  Final  MRSA PCR Screening     Status: None   Collection Time: 04/28/16  7:00 PM  Result Value Ref Range Status   MRSA by PCR NEGATIVE NEGATIVE Final    Comment:        The GeneXpert MRSA Assay (FDA approved for NASAL specimens only), is one component of a comprehensive MRSA colonization surveillance program. It is not intended to diagnose MRSA infection nor to guide or monitor treatment for MRSA infections.     Coagulation Studies: No results for input(s): LABPROT, INR in the last 72 hours.  Urinalysis:  Recent Labs  05/04/16 1850  COLORURINE YELLOW*  LABSPEC 1.010  PHURINE 5.0  GLUCOSEU NEGATIVE  HGBUR 3+*  BILIRUBINUR NEGATIVE  KETONESUR NEGATIVE  PROTEINUR 100*  NITRITE NEGATIVE  LEUKOCYTESUR NEGATIVE      Imaging: No results found.  Medications:     . cloNIDine  0.2 mg Oral TID  . heparin  5,000 Units Subcutaneous Q8H  . hydrALAZINE  100 mg Oral Q6H  . insulin aspart  0-5 Units Subcutaneous QHS  . insulin aspart  0-9 Units Subcutaneous TID WC  . levothyroxine  175 mcg Oral QAC breakfast  . metoprolol tartrate  50 mg Oral BID   ALPRAZolam, hydrALAZINE, ondansetron **OR** ondansetron (ZOFRAN) IV  Assessment/ Plan:  [name] is a 82 y.o. white female with osteoarthritis, right breast cancer status post radiation therapy, coronary artery disease status post four-vessel CABG, diabetes mellitus type 2, GERD, hyperlipidemia, hypertension,  hypothyroidism who was admitted to [facility] on 05/04/2016 for Acute Renal Failure Hydronephrosis  1. Acute renal failure on chronic kidney disease stage IV with proteinuria and hematuria: Positive ANA, ANCA and SPEP/UPEP: also C4 on low end of normal. Red cell casts on urine microscopy in clinic 8/3 - IV solumedrol  - Schedule renal biopsy for Monday 8/7. Radiologist to do.  - discussed dialysis with patient. Nonoliguric with improving creatinine. No acute indication for dialysis.  - Foley catheter due to urinary retention. Patient will need follow up with Urology.   2. Hypertension: some elevations Holding furosemide. Not on an ACE-I/ARB as outaptient.  - clonidine, hydralazine and metoprolol.  - start amlodipine. PRN hydralazine  3. Anemia of chronic kidney disease.  - low threshold to start epo.    LOS: 3 [name] 8/6/201711:39 AM

## 2016-05-07 NOTE — Progress Notes (Addendum)
[place] at [place] NAME: Portia Matkin    MR#:  [id number]  DATE OF BIRTH:  [birth year]  SUBJECTIVE:  CHIEF COMPLAINT:  Patient is sob with exertion, refusing nebs or inhalers 2/2 drymouth. Ok with oxygen and steroids PRN    REVIEW OF SYSTEMS:  CONSTITUTIONAL: No fever, fatigue or weakness.  EYES: No blurred or double vision.  EARS, NOSE, AND THROAT: No tinnitus or ear pain.  RESPIRATORY: No cough, Has shortness of breath with exertion, min wheezing , no hemoptysis.  CARDIOVASCULAR: No chest pain, orthopnea, edema.  GASTROINTESTINAL: No nausea, vomiting, diarrhea or abdominal pain.  GENITOURINARY: No dysuria, hematuria.  ENDOCRINE: No polyuria, nocturia,  HEMATOLOGY: No anemia, easy bruising or bleeding SKIN: No rash or lesion. MUSCULOSKELETAL: No joint pain or arthritis.   NEUROLOGIC: No tingling, numbness, weakness.  PSYCHIATRY: No anxiety or depression.   DRUG ALLERGIES:   Allergies  Allergen Reactions  . Ciprocinonide [Fluocinolone]     Feels crazy  . Darvocet [Propoxyphene N-Acetaminophen]     Difficulty breathing and swallowing  . Duratuss G [Guaifenesin]     Other reaction(s): Unknown  . Ivp Dye [Iodinated Diagnostic Agents]     Nausea & vomiting  . Ketek  [Telithromycin]     Other reaction(s): Unknown  . Levaquin  [Levofloxacin] Nausea Only  . Other     Reports all cholesterol medication  . Statins     Muscle aches    VITALS:  Blood pressure (!) 148/57, pulse 71, temperature 97.7 F (36.5 C), temperature source Oral, resp. rate 18, height 5\' 5"  (1.651 m), weight 94.8 kg (209 lb), SpO2 94 %.  PHYSICAL EXAMINATION:  GENERAL:  82 y.o.-year-old patient lying in the bed with no acute distress.  EYES: Pupils equal, round, reactive to light and accommodation. No scleral icterus. Extraocular muscles intact.  HEENT: Head atraumatic, normocephalic. Oropharynx and nasopharynx clear.  NECK:  Supple, no jugular  venous distention. No thyroid enlargement, no tenderness.  LUNGS: Mod  breath sounds bilaterally, min wheezing, no rales,rhonchi or crepitation. No use of accessory muscles of respiration.  CARDIOVASCULAR: S1, S2 normal. No murmurs, rubs, or gallops.  ABDOMEN: Soft, nontender, nondistended. Bowel sounds present. No organomegaly or mass.  EXTREMITIES: No pedal edema, cyanosis, or clubbing.  NEUROLOGIC: Cranial nerves II through XII are intact. Muscle strength 5/5 in all extremities. Sensation intact. Gait not checked.  PSYCHIATRIC: The patient is alert and oriented x 3.  SKIN: No obvious rash, lesion, or ulcer.    LABORATORY PANEL:   CBC  Recent Labs Lab 05/04/16 1522  WBC 8.3  HGB 7.6*  HCT 21.9*  PLT 204   ------------------------------------------------------------------------------------------------------------------  Chemistries   Recent Labs Lab 05/07/16 0456  NA 135  K 4.2  CL 102  CO2 26  GLUCOSE 224*  BUN 85*  CREATININE 3.69*  CALCIUM 8.2*   ------------------------------------------------------------------------------------------------------------------  Cardiac Enzymes No results for input(s): TROPONINI in the last 168 hours. ------------------------------------------------------------------------------------------------------------------  RADIOLOGY:  No results found.  EKG:   Orders placed or performed during the hospital encounter of 04/27/16  . ED EKG  . ED EKG  . EKG 12-Lead  . EKG 12-Lead  . EKG    ASSESSMENT AND PLAN:    82 year old female history of type 2 diabetes non insulin requiring, ckd baseline Cr 2 presenting with abnormal labs  1. Acute on chronic renal failure: in setting of ANA and ANCA  positive and SPEP/UPEP positive -  S/p ivf BUN-  27 - 68    , Cr - 4.01-3.84 Pulse steroids - solumedrol   Ct chest With groundglass attenuation and chronic airway disease -needs outpatient pulmonology follow-up    Scheduled US guided  renal biopsy tomorrow  Mon by radiologist  possible need for hemodialysis.  Appreciate nephro recommendations  2. Mild copd   refusing nebs and inhalers Provide humidified 02 and on iv  steroids   # Type 2 dm, non insulin requiring: hold oral agents,  sliding scale  3. htn ess: continue home meds metoprolol, clonidine and hydralazine, dose titation prn Hold lasix  4. gerd without esophagitis: ppi  5. Hyperlipidemia unspecified: statin  6. Hypothyroidism - continue synthyroid   Vte px: heparin   All the records are reviewed and case discussed with Care Management/Social Workerr. Management plans discussed with the patient, family and they are in agreement.  CODE STATUS: fc   TOTAL TIME TAKING CARE OF THIS PATIENT:  minutes.   POSSIBLE D/C IN 1-2  DAYS, DEPENDING ON CLINICAL CONDITION.  Note: This dictation was prepared with Dragon dictation along with smaller phrase technology. Any transcriptional errors that result from this process are unintentional.   Nicholes Mango M.D on 05/07/2016 at 8:58 PM  Between 7am to 6pm - Pager - [phone] After 6pm go to [email or web address] - password [email or web address]  Office  [phone]  CC: Primary care physician; Anthonette Legato, MD

## 2016-05-07 NOTE — Care Management Important Message (Signed)
Important Message  Patient Details  Name: [name] MRN: [id number] Date of Birth: 1934/07/30   Medicare Important Message Given:  Yes    [name], RN 05/07/2016, 2:39 PM

## 2016-05-08 ENCOUNTER — Inpatient Hospital Stay: Payer: Medicare Other

## 2016-05-08 LAB — PROTEIN ELECTROPHORESIS, SERUM
A/G Ratio: 0.5 — ABNORMAL LOW (ref 0.7–1.7)
Albumin ELP: 2.4 g/dL — ABNORMAL LOW (ref 2.9–4.4)
Alpha-1-Globulin: 0.3 g/dL (ref 0.0–0.4)
Alpha-2-Globulin: 0.9 g/dL (ref 0.4–1.0)
Beta Globulin: 0.6 g/dL — ABNORMAL LOW (ref 0.7–1.3)
Gamma Globulin: 3.4 g/dL — ABNORMAL HIGH (ref 0.4–1.8)
Globulin, Total: 5.1 g/dL — ABNORMAL HIGH (ref 2.2–3.9)
M-Spike, %: 1.8 g/dL — ABNORMAL HIGH
Total Protein ELP: 7.5 g/dL (ref 6.0–8.5)

## 2016-05-08 LAB — CBC
HCT: 20.6 % — ABNORMAL LOW (ref 35.0–47.0)
HCT: 21.3 % — ABNORMAL LOW (ref 35.0–47.0)
Hemoglobin: 7.2 g/dL — ABNORMAL LOW (ref 12.0–16.0)
Hemoglobin: 7.4 g/dL — ABNORMAL LOW (ref 12.0–16.0)
MCH: 29.3 pg (ref 26.0–34.0)
MCH: 29 pg (ref 26.0–34.0)
MCHC: 35.1 g/dL (ref 32.0–36.0)
MCHC: 34.6 g/dL (ref 32.0–36.0)
MCV: 83.5 fL (ref 80.0–100.0)
MCV: 83.9 fL (ref 80.0–100.0)
Platelets: 242 10*3/uL (ref 150–440)
Platelets: 252 10*3/uL (ref 150–440)
RBC: 2.47 MIL/uL — ABNORMAL LOW (ref 3.80–5.20)
RBC: 2.54 MIL/uL — ABNORMAL LOW (ref 3.80–5.20)
RDW: 15.2 % — ABNORMAL HIGH (ref 11.5–14.5)
RDW: 15.7 % — ABNORMAL HIGH (ref 11.5–14.5)
WBC: 7.9 10*3/uL (ref 3.6–11.0)
WBC: 9.6 10*3/uL (ref 3.6–11.0)

## 2016-05-08 LAB — PROTIME-INR
INR: 1.17
Prothrombin Time: 15 seconds (ref 11.4–15.2)

## 2016-05-08 LAB — PROTEIN ELECTRO, RANDOM URINE
Albumin ELP, Urine: 29.8 %
Alpha-1-Globulin, U: 1.7 %
Alpha-2-Globulin, U: 7.7 %
Beta Globulin, U: 10.8 %
Gamma Globulin, U: 50 %
M Component, Ur: 31.5 % — ABNORMAL HIGH
Total Protein, Urine: 71.8 mg/dL

## 2016-05-08 LAB — BASIC METABOLIC PANEL
Anion gap: 7 (ref 5–15)
BUN: 98 mg/dL — ABNORMAL HIGH (ref 6–20)
CO2: 26 mmol/L (ref 22–32)
Calcium: 8.2 mg/dL — ABNORMAL LOW (ref 8.9–10.3)
Chloride: 103 mmol/L (ref 101–111)
Creatinine, Ser: 3.65 mg/dL — ABNORMAL HIGH (ref 0.44–1.00)
GFR calc Af Amer: 13 mL/min — ABNORMAL LOW (ref >60)
GFR calc non Af Amer: 11 mL/min — ABNORMAL LOW (ref >60)
Glucose, Bld: 212 mg/dL — ABNORMAL HIGH (ref 65–99)
Potassium: 4.1 mmol/L (ref 3.5–5.1)
Sodium: 136 mmol/L (ref 135–145)

## 2016-05-08 LAB — ABO/RH: ABO/RH(D): O POS

## 2016-05-08 LAB — GLUCOSE, CAPILLARY
Glucose-Capillary: 203 mg/dL — ABNORMAL HIGH (ref 65–99)
Glucose-Capillary: 167 mg/dL — ABNORMAL HIGH (ref 65–99)
Glucose-Capillary: 170 mg/dL — ABNORMAL HIGH (ref 65–99)
Glucose-Capillary: 336 mg/dL — ABNORMAL HIGH (ref 65–99)

## 2016-05-08 LAB — APTT: aPTT: 39 seconds — ABNORMAL HIGH (ref 24–36)

## 2016-05-08 MED ORDER — MIDAZOLAM HCL 5 MG/5ML IJ SOLN
INTRAMUSCULAR | Status: AC | PRN
  Administered 2016-05-08: 0.5 mg via INTRAVENOUS
  Administered 2016-05-08: 1 mg via INTRAVENOUS

## 2016-05-08 MED ORDER — FENTANYL CITRATE (PF) 100 MCG/2ML IJ SOLN
INTRAMUSCULAR | Status: AC
  Filled 2016-05-08: qty 4

## 2016-05-08 MED ORDER — MIDAZOLAM HCL 5 MG/5ML IJ SOLN
INTRAMUSCULAR | Status: AC | PRN
  Administered 2016-05-08 (×2): 0.5 mg via INTRAVENOUS

## 2016-05-08 MED ORDER — SODIUM CHLORIDE 0.9 % IV SOLN
INTRAVENOUS | Status: AC | PRN
  Administered 2016-05-08: 10 mL/h via INTRAVENOUS

## 2016-05-08 MED ORDER — FENTANYL CITRATE (PF) 100 MCG/2ML IJ SOLN
INTRAMUSCULAR | Status: AC | PRN
  Administered 2016-05-08: 50 ug via INTRAVENOUS

## 2016-05-08 MED ORDER — OXYCODONE-ACETAMINOPHEN 5-325 MG PO TABS: 1.0000 | ORAL_TABLET | Freq: Three times a day (TID) | ORAL | Status: DC | PRN

## 2016-05-08 MED ORDER — SODIUM CHLORIDE 0.9 % IV SOLN: INTRAVENOUS | Status: DC

## 2016-05-08 MED ORDER — FENTANYL CITRATE (PF) 100 MCG/2ML IJ SOLN
INTRAMUSCULAR | Status: AC | PRN
  Administered 2016-05-08 (×2): 25 ug via INTRAVENOUS

## 2016-05-08 MED ORDER — MIDAZOLAM HCL 5 MG/5ML IJ SOLN
INTRAMUSCULAR | Status: AC
  Filled 2016-05-08: qty 5

## 2016-05-08 MED ORDER — SODIUM CHLORIDE 0.9 % IV SOLN: Freq: Once | INTRAVENOUS | Status: DC

## 2016-05-08 NOTE — Progress Notes (Signed)
Patient ID: [name], female   DOB: Jan 27, 1934, 82 y.o.   MRN: [id number] Request for renal biopsy.  History and Physical have been reviewed.  Patient is anemic with hemoglobin of 7.2 this morning.  Discussed with medicine team and they are planning to give blood later today.  Will proceed with renal biopsy with morning. Heart: RRR Lungs: CTA Abd: soft and nontender A/P:  Acute on chronic renal failure.  Plan for US guided renal biopsy.  Discussed biopsy with patient and informed consent obtained. Patient aware of low hemoglobin.  Plan for bedrest all day after biopsy to decrease bleeding risk.

## 2016-05-08 NOTE — Procedures (Signed)
US guided core biopsies of right kidney lower pole.  2 cores obtained with 16 gauge needle.  No immediate complication and minimal blood loss.  See full report in PACS.

## 2016-05-08 NOTE — Sedation Documentation (Signed)
Unable to do biopsy at present d/t elevated BP.  Sent back to room-to receive medications and retry doing procedure this afternoon.

## 2016-05-08 NOTE — Progress Notes (Addendum)
Inpatient Diabetes Program Recommendations  AACE/ADA: New Consensus Statement on Inpatient Glycemic Control (2015)  Target Ranges:  Prepandial:   less than 140 mg/dL      Peak postprandial:   less than 180 mg/dL (1-2 hours)      Critically ill patients:  140 - 180 mg/dL   Results for [name], [name] (MRN [id number]) as of 05/08/2016 09:31  Ref. Range 05/07/2016 07:26 05/07/2016 11:31 05/07/2016 16:43 05/07/2016 21:46  Glucose-Capillary Latest Ref Range: 65 - 99 mg/dL 212 (H) 211 (H) 218 (H) 309 (H)    Admit with: Acute on Chronic Renal Failure  History: DM  Home DM Meds: None  Current Insulin Orders: Novolog Sensitive Correction Scale/ SSI (0-9 units) TID AC + HS     -Note patient receiving high dose IV steroids.  Last dose IV Solumedrol 500 mg scheduled to be given tomorrow AM (08/08).  -Glucose levels >200 mg/dl.     MD- Please consider increasing Novolog Correction Scale/ SSI to Moderate scale (0-15 units) Q4 hours while patient getting steroids      --Will follow patient during hospitalization--  Wyn Quaker RN, MSN, CDE Diabetes Coordinator Inpatient Glycemic Control Team Team Pager: [phone] (8a-5p)

## 2016-05-08 NOTE — Progress Notes (Signed)
[place] at [place] NAME: [name]    MR#:  [id number]  DATE OF BIRTH:  [birth year]  SUBJECTIVE:  CHIEF COMPLAINT:  Patient is Feeling better. Shortness of breath improved. Awaiting for biopsy.    REVIEW OF SYSTEMS:  CONSTITUTIONAL: No fever, fatigue or weakness.  EYES: No blurred or double vision.  EARS, NOSE, AND THROAT: No tinnitus or ear pain.  RESPIRATORY: No cough, Has shortness of breath with exertion, min wheezing , no hemoptysis.  CARDIOVASCULAR: No chest pain, orthopnea, edema.  GASTROINTESTINAL: No nausea, vomiting, diarrhea or abdominal pain.  GENITOURINARY: No dysuria, hematuria.  ENDOCRINE: No polyuria, nocturia,  HEMATOLOGY: No anemia, easy bruising or bleeding SKIN: No rash or lesion. MUSCULOSKELETAL: No joint pain or arthritis.   NEUROLOGIC: No tingling, numbness, weakness.  PSYCHIATRY: No anxiety or depression.   DRUG ALLERGIES:   Allergies  Allergen Reactions  . Ciprocinonide [Fluocinolone]     Feels crazy  . Darvocet [Propoxyphene N-Acetaminophen]     Difficulty breathing and swallowing  . Duratuss G [Guaifenesin]     Other reaction(s): Unknown  . Ivp Dye [Iodinated Diagnostic Agents]     Nausea & vomiting  . Ketek  [Telithromycin]     Other reaction(s): Unknown  . Levaquin  [Levofloxacin] Nausea Only  . Other     Reports all cholesterol medication  . Statins     Muscle aches    VITALS:  Blood pressure 124/64, pulse 77, temperature 97.6 F (36.4 C), temperature source Oral, resp. rate 16, height 5\' 5"  (1.651 m), weight 94.8 kg (209 lb), SpO2 99 %.  PHYSICAL EXAMINATION:  GENERAL:  82 y.o.-year-old patient lying in the bed with no acute distress.  EYES: Pupils equal, round, reactive to light and accommodation. No scleral icterus. Extraocular muscles intact.  HEENT: Head atraumatic, normocephalic. Oropharynx and nasopharynx clear.  NECK:  Supple, no jugular venous distention. No thyroid  enlargement, no tenderness.  LUNGS: Mod  breath sounds bilaterally, min wheezing, no rales,rhonchi or crepitation. No use of accessory muscles of respiration.  CARDIOVASCULAR: S1, S2 normal. No murmurs, rubs, or gallops.  ABDOMEN: Soft, nontender, nondistended. Bowel sounds present. No organomegaly or mass.  EXTREMITIES: No pedal edema, cyanosis, or clubbing.  NEUROLOGIC: Cranial nerves II through XII are intact. Muscle strength 5/5 in all extremities. Sensation intact. Gait not checked.  PSYCHIATRIC: The patient is alert and oriented x 3.  SKIN: No obvious rash, lesion, or ulcer.    LABORATORY PANEL:   CBC  Recent Labs Lab 05/08/16 0500  WBC 7.9  HGB 7.2*  HCT 20.6*  PLT 242   ------------------------------------------------------------------------------------------------------------------  Chemistries   Recent Labs Lab 05/08/16 0500  NA 136  K 4.1  CL 103  CO2 26  GLUCOSE 212*  BUN 98*  CREATININE 3.65*  CALCIUM 8.2*   ------------------------------------------------------------------------------------------------------------------  Cardiac Enzymes No results for input(s): TROPONINI in the last 168 hours. ------------------------------------------------------------------------------------------------------------------  RADIOLOGY:  No results found.  EKG:   Orders placed or performed during the hospital encounter of 04/27/16  . ED EKG  . ED EKG  . EKG 12-Lead  . EKG 12-Lead  . EKG    ASSESSMENT AND PLAN:    82 year old female history of type 2 diabetes non insulin requiring, ckd baseline Cr 2 presenting with abnormal labs  1. Acute on chronic renal failure: in setting of ANA and ANCA  positive and SPEP/UPEP positive -  S/p ivf BUN- 59 - 68    ,  Cr - 4.01-3.84-3.65 Pulse steroids - solumedrol   Ct chest With groundglass attenuation and chronic airway disease -needs outpatient pulmonology follow-up    Scheduled US guided renal biopsy today, but  radiologist is not comfortable doing biopsy considering her advanced age and other comorbidities, we will discuss with nephrology possible need for hemodialysis.  Appreciate nephro recommendations  2. Mild copd   refusing nebs and inhalers Provide humidified 02 and on iv  steroids   # Type 2 dm, non insulin requiring: Currently patient is nothing by mouth for possible renal biopsy Continue sliding scale insulin sensitive dose. Diabetic coordinator recommendations noted, will increase the sliding scale to moderate dose when patient starts eating, currently she is nothing by mouth  3. htn ess: continue home meds metoprolol, clonidine and hydralazine, dose titation prn Hold lasix  4. gerd without esophagitis: ppi  5. Hyperlipidemia unspecified: statin  6. Hypothyroidism - continue synthyroid  7. Chronic anemia secondary to end-stage renal disease Hemoglobin at 7.2. Will transfuse blood if repeat hemoglobin and hematocrit is less than 7.0 Plan discussed with the patient. Patient is agreeable with the blood transfusion. Pros and cons discussed with the patient   Vte px: heparin   All the records are reviewed and case discussed with Care Management/Social Workerr. Management plans discussed with the patient, family and they are in agreement.  CODE STATUS: fc   TOTAL TIME TAKING CARE OF THIS PATIENT:  36 minutes.   POSSIBLE D/C IN 1-2  DAYS, DEPENDING ON CLINICAL CONDITION.  Note: This dictation was prepared with Dragon dictation along with smaller phrase technology. Any transcriptional errors that result from this process are unintentional.   [name] M.D on 05/08/2016 at 1:37 PM  Between 7am to 6pm - Pager - [phone] After 6pm go to [email or web address] - password [email or web address]  Office  [phone]  CC: Primary care physician; Sarah Legato, MD

## 2016-05-08 NOTE — Progress Notes (Signed)
Patient ID: [name], female   DOB: 1934/09/24, 82 y.o.   MRN: [id number] Patient brought to [place] for renal biopsy.  Patient placed prone and given sedation for procedure. Despite sedation, BP remained elevated with diastolic pressure around [id number] mmHg.  Patient has not received her BP meds yet today.  Will send back to floor and patient will get scheduled meds.  If BP comes down, we will try to perform biopsy this afternoon.

## 2016-05-08 NOTE — Progress Notes (Signed)
MD called and received orders to resume renal diet. Orders also received to discontinue blood order since hgb levels increased

## 2016-05-08 NOTE — Progress Notes (Signed)
Subjective:   Patient underwent kidney biopsy today No adverse events reported S Cr 3.65 Denies acute SOB  No leg edema  Objective:  Vital signs in last 24 hours:  Temp:  [97.6 F (36.4 C)-98 F (36.7 C)] 98 F (36.7 C) (08/07 1600) Pulse Rate:  [68-89] 89 (08/07 1600) Resp:  [15-20] 17 (08/07 1600) BP: (124-168)/(57-122) 144/79 (08/07 1600) SpO2:  [94 %-100 %] 100 % (08/07 1600)  Weight change:  Filed Weights   05/04/16 1439  Weight: 94.8 kg (209 lb)    Intake/Output:    Intake/Output Summary (Last 24 hours) at 05/08/16 1805 Last data filed at 05/08/16 1700  Gross per 24 hour  Intake              294 ml  Output             2600 ml  Net            -2306 ml     Physical Exam: General: NAD, laying in bed  HEENT Anicteric, moist mucus membranes  Neck supple  Pulm/lungs Clear b/l  CVS/Heart Regular, no rub  Abdomen:  Soft, NT, non distended  Extremities: No edema  Neurologic: Alert, oreinted  Skin: No acute rashes          Basic Metabolic Panel:   Recent Labs Lab 05/04/16 1522 05/05/16 0519 05/06/16 0550 05/07/16 0456 05/08/16 0500  NA 134* 134* 134* 135 136  K 3.8 4.3 4.2 4.2 4.1  CL 101 103 101 102 103  CO2 23 26 25 26 26   GLUCOSE 163* 216* 212* 224* 212*  BUN 57* 59* 68* 85* 98*  CREATININE 4.12* 4.01* 3.84* 3.69* 3.65*  CALCIUM 8.0* 7.9* 8.1* 8.2* 8.2*     CBC:  Recent Labs Lab 05/04/16 1522 05/08/16 0500 05/08/16 1240  WBC 8.3 7.9 9.6  HGB 7.6* 7.2* 7.4*  HCT 21.9* 20.6* 21.3*  MCV 83.6 83.5 83.9  PLT 204 242 252      Microbiology:  Recent Results (from the past 720 hour(s))  Urine culture     Status: Abnormal   Collection Time: 04/27/16 11:06 PM  Result Value Ref Range Status   Specimen Description URINE, CLEAN CATCH  Final   Special Requests Normal  Final   Culture (A)  Final    <10,000 COLONIES/mL INSIGNIFICANT GROWTH Performed at [facility]    Report Status 04/29/2016 FINAL  Final  MRSA PCR Screening      Status: None   Collection Time: 04/28/16  7:00 PM  Result Value Ref Range Status   MRSA by PCR NEGATIVE NEGATIVE Final    Comment:        The GeneXpert MRSA Assay (FDA approved for NASAL specimens only), is one component of a comprehensive MRSA colonization surveillance program. It is not intended to diagnose MRSA infection nor to guide or monitor treatment for MRSA infections.     Coagulation Studies:  Recent Labs  05/08/16 0500  LABPROT 15.0  INR 1.17    Urinalysis: No results for input(s): COLORURINE, LABSPEC, PHURINE, GLUCOSEU, HGBUR, BILIRUBINUR, KETONESUR, PROTEINUR, UROBILINOGEN, NITRITE, LEUKOCYTESUR in the last 72 hours.  Invalid input(s): APPERANCEUR    Imaging: US Biopsy  Result Date: 05/08/2016 INDICATION: 82 year old with acute on chronic renal failure. EXAM: ULTRASOUND GUIDED RENAL CORE BIOPSY MEDICATIONS: None. ANESTHESIA/SEDATION: Fentanyl 50 mcg IV; Versed 1 mg IV Moderate Sedation Time:  15 minutes The patient was continuously monitored during the procedure by the interventional radiology nurse under my direct supervision.  FLUOROSCOPY TIME:  None COMPLICATIONS: None immediate. PROCEDURE: The procedure was explained to the patient. The risks and benefits of the procedure were discussed and the patient's questions were addressed. Informed consent was obtained from the patient. Patient was scheduled for renal biopsy earlier in the day. However, the patient's blood pressure remained markedly elevated despite moderate sedation. Therefore, the patient went back to the floor and received her scheduled blood pressure medications. After the patient's blood pressure had come down, the patient was returned Radiology for the biopsy procedure. Patient's blood pressure was suitable for a biopsy. Patient was placed prone. Ultrasound of the kidneys was performed. The right kidney was selected for biopsy. The right flank was prepped with chlorhexidine and sterile field was  created. Skin and soft tissues were anesthetized with 1% lidocaine. Using ultrasound guidance, 16 gauge core needle was used to biopsy the lower pole cortex. Adequate specimen was obtained. A second ultrasound-guided core biopsy obtained with a 16 gauge needle from the lower pole cortex. Again, an adequate core sample was obtained. Bandage placed over the puncture site. FINDINGS: Small cortical cyst in the right kidney lower pole. Core biopsies obtained from the lower pole cortex. No evidence for hematoma formation or significant bleeding following the core biopsies. IMPRESSION: Successful ultrasound-guided core biopsies of the right kidney lower pole. Electronically Signed   By: Markus Daft M.D.   On: 05/08/2016 17:22     Medications:     . amLODipine  10 mg Oral Daily  . cloNIDine  0.2 mg Oral TID  . fentaNYL      . heparin  5,000 Units Subcutaneous Q8H  . hydrALAZINE  100 mg Oral Q6H  . insulin aspart  0-5 Units Subcutaneous QHS  . insulin aspart  0-9 Units Subcutaneous TID WC  . levothyroxine  175 mcg Oral QAC breakfast  . methylPREDNISolone (SOLU-MEDROL) injection  500 mg Intravenous Q24H  . metoprolol tartrate  50 mg Oral BID  . midazolam       ALPRAZolam, hydrALAZINE, ondansetron **OR** ondansetron (ZOFRAN) IV, oxyCODONE-acetaminophen  Assessment/ Plan:  82 y.o. female with osteoarthritis, right breast cancer status post radiation therapy, coronary artery disease status post four-vessel CABG, diabetes mellitus type 2, GERD, hyperlipidemia, hypertension, hypothyroidism who was admitted to [facility] on 8/3/2017for Acute Renal Failure Hydronephrosis  1. Acute renal failure on chronic kidney disease stage IV with proteinuria and hematuria: Positive ANA, ANCA and SPEP/UPEP: also C4 on low end of normal. Red cell casts on urine microscopy in clinic 8/3 - IV solumedrol  - underwent renal biopsy today.  - Foley catheter due to urinary retention. Discussed with Dr Erlene Quan, patient will need  follow up with Urology for voiding trial May need to learn self cath if urinary retention persists.  2. Hypertension: some elevations Holding furosemide. Not on an ACE-I/ARB as outaptient.  -  Acceptable control     LOS: 4 Ulmer Degen 8/7/20176:05 PM

## 2016-05-09 ENCOUNTER — Ambulatory Visit (INDEPENDENT_AMBULATORY_CARE_PROVIDER_SITE_OTHER): Payer: Medicare Other | Admitting: Urology

## 2016-05-09 ENCOUNTER — Ambulatory Visit: Payer: Self-pay | Admitting: Urology

## 2016-05-09 ENCOUNTER — Encounter: Payer: Self-pay | Admitting: Urology

## 2016-05-09 VITALS — BP 174/81 | HR 95 | Ht 64.0 in | Wt 205.2 lb

## 2016-05-09 DIAGNOSIS — Z87448 Personal history of other diseases of urinary system: Secondary | ICD-10-CM | POA: Diagnosis not present

## 2016-05-09 DIAGNOSIS — R339 Retention of urine, unspecified: Secondary | ICD-10-CM

## 2016-05-09 LAB — GLUCOSE, CAPILLARY
Glucose-Capillary: 253 mg/dL — ABNORMAL HIGH (ref 65–99)
Glucose-Capillary: 239 mg/dL — ABNORMAL HIGH (ref 65–99)

## 2016-05-09 LAB — BASIC METABOLIC PANEL
Anion gap: 9 (ref 5–15)
BUN: 102 mg/dL — ABNORMAL HIGH (ref 6–20)
CO2: 26 mmol/L (ref 22–32)
Calcium: 8.2 mg/dL — ABNORMAL LOW (ref 8.9–10.3)
Chloride: 104 mmol/L (ref 101–111)
Creatinine, Ser: 3.43 mg/dL — ABNORMAL HIGH (ref 0.44–1.00)
GFR calc Af Amer: 14 mL/min — ABNORMAL LOW (ref >60)
GFR calc non Af Amer: 12 mL/min — ABNORMAL LOW (ref >60)
Glucose, Bld: 268 mg/dL — ABNORMAL HIGH (ref 65–99)
Potassium: 4.3 mmol/L (ref 3.5–5.1)
Sodium: 139 mmol/L (ref 135–145)

## 2016-05-09 MED ORDER — PREDNISONE 20 MG PO TABS: 20.0000 mg | ORAL_TABLET | Freq: Every day | ORAL | 0 refills | Status: DC

## 2016-05-09 MED ORDER — AMLODIPINE BESYLATE 10 MG PO TABS: 10.0000 mg | ORAL_TABLET | Freq: Every day | ORAL | 0 refills | Status: DC

## 2016-05-09 NOTE — Progress Notes (Signed)
[facility] [place] NAME: [name]    MR#:  [id number]  DATE OF BIRTH:  Jun 24, 1934  SUBJECTIVE: says she is ready  discharge today.  CHIEF COMPLAINT:    REVIEW OF SYSTEMS:  CONSTITUTIONAL: No fever, fatigue or weakness.  EYES: No blurred or double vision.  EARS, NOSE, AND THROAT: No tinnitus or ear pain.  RESPIRATORY: No cough, Has shortness of breath with exertion, min wheezing , no hemoptysis.  CARDIOVASCULAR: No chest pain, orthopnea, edema.  GASTROINTESTINAL: No nausea, vomiting, diarrhea or abdominal pain.  GENITOURINARY: No dysuria, hematuria.  ENDOCRINE: No polyuria, nocturia,  HEMATOLOGY: No anemia, easy bruising or bleeding SKIN: No rash or lesion. MUSCULOSKELETAL: No joint pain or arthritis.   NEUROLOGIC: No tingling, numbness, weakness.  PSYCHIATRY: No anxiety or depression.   DRUG ALLERGIES:   Allergies  Allergen Reactions  . Ciprocinonide [Fluocinolone]     Feels crazy  . Darvocet [Propoxyphene N-Acetaminophen]     Difficulty breathing and swallowing  . Duratuss G [Guaifenesin]     Other reaction(s): Unknown  . Ivp Dye [Iodinated Diagnostic Agents]     Nausea & vomiting  . Ketek  [Telithromycin]     Other reaction(s): Unknown  . Levaquin  [Levofloxacin] Nausea Only  . Other     Reports all cholesterol medication  . Statins     Muscle aches    VITALS:  Blood pressure (!) 159/66, pulse 73, temperature 97.5 F (36.4 C), temperature source Oral, resp. rate 20, height 5\' 5"  (1.651 m), weight 94.8 kg (209 lb), SpO2 94 %.  PHYSICAL EXAMINATION:  GENERAL:  82 y.o.-year-old patient lying in the bed with no acute distress.  EYES: Pupils equal, round, reactive to light and accommodation. No scleral icterus. Extraocular muscles intact.  HEENT: Head atraumatic, normocephalic. Oropharynx and nasopharynx clear.  NECK:  Supple, no jugular venous distention. No thyroid enlargement, no tenderness.  LUNGS: Mod   breath sounds bilaterally, min wheezing, no rales,rhonchi or crepitation. No use of accessory muscles of respiration.  CARDIOVASCULAR: S1, S2 normal. No murmurs, rubs, or gallops.  ABDOMEN: Soft, nontender, nondistended. Bowel sounds present. No organomegaly or mass.  EXTREMITIES: No pedal edema, cyanosis, or clubbing.  NEUROLOGIC: Cranial nerves II through XII are intact. Muscle strength 5/5 in all extremities. Sensation intact. Gait not checked.  PSYCHIATRIC: The patient is alert and oriented x 3.  SKIN: No obvious rash, lesion, or ulcer.    LABORATORY PANEL:   CBC  Recent Labs Lab 05/08/16 1240  WBC 9.6  HGB 7.4*  HCT 21.3*  PLT 252   ------------------------------------------------------------------------------------------------------------------  Chemistries   Recent Labs Lab 05/09/16 0419  NA 139  K 4.3  CL 104  CO2 26  GLUCOSE 268*  BUN 102*  CREATININE 3.43*  CALCIUM 8.2*   ------------------------------------------------------------------------------------------------------------------  Cardiac Enzymes No results for input(s): TROPONINI in the last 168 hours. ------------------------------------------------------------------------------------------------------------------  RADIOLOGY:  US Biopsy  Result Date: 05/08/2016 INDICATION: 82 year old with acute on chronic renal failure. EXAM: ULTRASOUND GUIDED RENAL CORE BIOPSY MEDICATIONS: None. ANESTHESIA/SEDATION: Fentanyl 50 mcg IV; Versed 1 mg IV Moderate Sedation Time:  15 minutes The patient was continuously monitored during the procedure by the interventional radiology nurse under my direct supervision. FLUOROSCOPY TIME:  None COMPLICATIONS: None immediate. PROCEDURE: The procedure was explained to the patient. The risks and benefits of the procedure were discussed and the patient's questions were addressed. Informed consent was obtained from the patient. Patient was scheduled for renal biopsy earlier in  the day.  However, the patient's blood pressure remained markedly elevated despite moderate sedation. Therefore, the patient went back to the floor and received her scheduled blood pressure medications. After the patient's blood pressure had come down, the patient was returned Radiology for the biopsy procedure. Patient's blood pressure was suitable for a biopsy. Patient was placed prone. Ultrasound of the kidneys was performed. The right kidney was selected for biopsy. The right flank was prepped with chlorhexidine and sterile field was created. Skin and soft tissues were anesthetized with 1% lidocaine. Using ultrasound guidance, 16 gauge core needle was used to biopsy the lower pole cortex. Adequate specimen was obtained. A second ultrasound-guided core biopsy obtained with a 16 gauge needle from the lower pole cortex. Again, an adequate core sample was obtained. Bandage placed over the puncture site. FINDINGS: Small cortical cyst in the right kidney lower pole. Core biopsies obtained from the lower pole cortex. No evidence for hematoma formation or significant bleeding following the core biopsies. IMPRESSION: Successful ultrasound-guided core biopsies of the right kidney lower pole. Electronically Signed   By: Markus Daft M.D.   On: 05/08/2016 17:22    EKG:   Orders placed or performed during the hospital encounter of 04/27/16  . ED EKG  . ED EKG  . EKG 12-Lead  . EKG 12-Lead  . EKG    ASSESSMENT AND PLAN:    82 year old female history of type 2 diabetes non insulin requiring, ckd baseline Cr 2 presenting with abnormal labs  1. Acute on chronic renal failure: in setting of ANA and ANCA  positive and SPEP/UPEP positive -  S/p ivf BUN- 59 - 68    , Cr - 4.01-3.84-3.65 [place] for discharge. Had a biopsy of kidney yesterday. Discharge home with prednisone 60 mg daily, aspirin nephrology recommendation. For 14 days. Prednisone. Patient can see Dr. Nolon Lennert singh for biopsy results.  . 2. Mild copd       # Type 2 dm, non insulin requiring:   3. htn ess: continue home meds metoprolol, clonidine and hydralazine,Hold lasix  4. gerd without esophagitis: ppi  5. Hyperlipidemia unspecified: statin  6. Hypothyroidism - continue synthyroid  7. Chronic anemia secondary to end-stage renal disease  Discharged home with home health physical therapy, RN, aide.  Vte px: heparin   All the records are reviewed and case discussed with Care Management/Social Workerr. Management plans discussed with the patient, family and they are in agreement.  CODE STATUS: fc   TOTAL TIME TAKING CARE OF THIS PATIENT:  36 minutes.   POSSIBLE D/C IN 1-2  DAYS, DEPENDING ON CLINICAL CONDITION.  Note: This dictation was prepared with Dragon dictation along with smaller phrase technology. Any transcriptional errors that result from this process are unintentional.   Willaim Mode M.D on 05/09/2016 at 12:07 PM  Between 7am to 6pm - Pager - [phone] After 6pm go to [email or web address] - password [email or web address] [facility]  Office  [phone]  CC: Primary care physician; Anthonette Legato, MD

## 2016-05-09 NOTE — Progress Notes (Signed)
Pt A and O x 4. VSS. Pt tolerating diet well. No complaints of pain or nausea. PICC removed intact, prescriptions given. Pt left with Foley intact. Pt voiced understanding of discharge instructions with no further questions. Pt discharged via wheelchair with nurse aide.

## 2016-05-09 NOTE — Care Management Note (Signed)
Case Management Note  Patient Details  Name: [name] MRN: [id number] Date of Birth: [birth year]  Subjective/Objective:                 Patient admitted with Acute on chronic renal failure.  Lives at home with her husband.  PCP Dr Doy Hutching.   [place]. Chronic foley in place.  RW in the home.  Patient open with [facility] for RN, PT, and aide.    Action/Plan: Corene Cornea with [facility] notified of admission.  Resumption of care order to be placed.  Husband to transport at discharge today.  RNCM signing off  Expected Discharge Date:                  Expected Discharge Plan:     In-House Referral:     Discharge planning Services     Post Acute Care Choice:    Choice offered to:     DME Arranged:    DME Agency:     HH Arranged:    HH Agency:     Status of Service:     If discussed at [employer], dates discussed:    Additional CommentsBeverly Sessions, RN 05/09/2016, 12:09 PM

## 2016-05-09 NOTE — Progress Notes (Signed)
Subjective:   Patient underwent kidney biopsy on Monday No adverse events reported S Cr 3.43 Denies acute SOB  trace leg edema Asking about going home  Objective:  Vital signs in last 24 hours:  Temp:  [97.5 F (36.4 C)-97.7 F (36.5 C)] 97.7 F (36.5 C) (08/08 1243) Pulse Rate:  [69-95] 95 (08/08 1454) Resp:  [19-24] 24 (08/08 1243) BP: (140-174)/(59-81) 174/81 (08/08 1454) SpO2:  [94 %-99 %] 99 % (08/08 1243) Weight:  [93.1 kg (205 lb 3.2 oz)] 93.1 kg (205 lb 3.2 oz) (08/08 1454)  Weight change:  Filed Weights   05/04/16 1439  Weight: 94.8 kg (209 lb)    Intake/Output:    Intake/Output Summary (Last 24 hours) at 05/09/16 1821 Last data filed at 05/09/16 1342  Gross per 24 hour  Intake                0 ml  Output             2000 ml  Net            -2000 ml     Physical Exam: General: NAD, laying in bed  HEENT Anicteric, moist mucus membranes  Neck supple  Pulm/lungs Clear b/l  CVS/Heart Regular, no rub  Abdomen:  Soft, NT, non distended  Extremities: No edema  Neurologic: Alert, oreinted  Skin: No acute rashes          Basic Metabolic Panel:   Recent Labs Lab 05/05/16 0519 05/06/16 0550 05/07/16 0456 05/08/16 0500 05/09/16 0419  NA 134* 134* 135 136 139  K 4.3 4.2 4.2 4.1 4.3  CL 103 101 102 103 104  CO2 26 25 26 26 26   GLUCOSE 216* 212* 224* 212* 268*  BUN 59* 68* 85* 98* 102*  CREATININE 4.01* 3.84* 3.69* 3.65* 3.43*  CALCIUM 7.9* 8.1* 8.2* 8.2* 8.2*     CBC:  Recent Labs Lab 05/04/16 1522 05/08/16 0500 05/08/16 1240  WBC 8.3 7.9 9.6  HGB 7.6* 7.2* 7.4*  HCT 21.9* 20.6* 21.3*  MCV 83.6 83.5 83.9  PLT 204 242 252      Microbiology:  Recent Results (from the past 720 hour(s))  Urine culture     Status: Abnormal   Collection Time: 04/27/16 11:06 PM  Result Value Ref Range Status   Specimen Description URINE, CLEAN CATCH  Final   Special Requests Normal  Final   Culture (A)  Final    <10,000 COLONIES/mL INSIGNIFICANT  GROWTH Performed at [facility]    Report Status 04/29/2016 FINAL  Final  MRSA PCR Screening     Status: None   Collection Time: 04/28/16  7:00 PM  Result Value Ref Range Status   MRSA by PCR NEGATIVE NEGATIVE Final    Comment:        The GeneXpert MRSA Assay (FDA approved for NASAL specimens only), is one component of a comprehensive MRSA colonization surveillance program. It is not intended to diagnose MRSA infection nor to guide or monitor treatment for MRSA infections.     Coagulation Studies:  Recent Labs  05/08/16 0500  LABPROT 15.0  INR 1.17    Urinalysis: No results for input(s): COLORURINE, LABSPEC, PHURINE, GLUCOSEU, HGBUR, BILIRUBINUR, KETONESUR, PROTEINUR, UROBILINOGEN, NITRITE, LEUKOCYTESUR in the last 72 hours.  Invalid input(s): APPERANCEUR    Imaging: US Biopsy  Result Date: 05/08/2016 INDICATION: 81 year old with acute on chronic renal failure. EXAM: ULTRASOUND GUIDED RENAL CORE BIOPSY MEDICATIONS: None. ANESTHESIA/SEDATION: Fentanyl 50 mcg IV; Versed 1 mg IV  Moderate Sedation Time:  15 minutes The patient was continuously monitored during the procedure by the interventional radiology nurse under my direct supervision. FLUOROSCOPY TIME:  None COMPLICATIONS: None immediate. PROCEDURE: The procedure was explained to the patient. The risks and benefits of the procedure were discussed and the patient's questions were addressed. Informed consent was obtained from the patient. Patient was scheduled for renal biopsy earlier in the day. However, the patient's blood pressure remained markedly elevated despite moderate sedation. Therefore, the patient went back to the floor and received her scheduled blood pressure medications. After the patient's blood pressure had come down, the patient was returned Radiology for the biopsy procedure. Patient's blood pressure was suitable for a biopsy. Patient was placed prone. Ultrasound of the kidneys was performed. The right  kidney was selected for biopsy. The right flank was prepped with chlorhexidine and sterile field was created. Skin and soft tissues were anesthetized with 1% lidocaine. Using ultrasound guidance, 16 gauge core needle was used to biopsy the lower pole cortex. Adequate specimen was obtained. A second ultrasound-guided core biopsy obtained with a 16 gauge needle from the lower pole cortex. Again, an adequate core sample was obtained. Bandage placed over the puncture site. FINDINGS: Small cortical cyst in the right kidney lower pole. Core biopsies obtained from the lower pole cortex. No evidence for hematoma formation or significant bleeding following the core biopsies. IMPRESSION: Successful ultrasound-guided core biopsies of the right kidney lower pole. Electronically Signed   By: Markus Daft M.D.   On: 05/08/2016 17:22     Medications:       Assessment/ Plan:  82 y.o. female with osteoarthritis, right breast cancer status post radiation therapy, coronary artery disease status post four-vessel CABG, diabetes mellitus type 2, GERD, hyperlipidemia, hypertension, hypothyroidism who was admitted to [facility] on 8/3/2017for Acute Renal Failure Hydronephrosis  1. Acute renal failure on chronic kidney disease stage IV with proteinuria and hematuria: Positive ANA, ANCA and SPEP/UPEP: also C4 on low end of normal. Red cell casts on urine microscopy in clinic 8/3 - IV solumedrol completed 3 days.  Continue prednisone 60 mg daily until final biopsy report is available - idney biopsy results awaited. - Foley catheter due to urinary retention. Discussed with Dr Erlene Quan, patient will need follow up with Urology for voiding trial May need to learn self cath if urinary retention persists.  2. Hypertension: some elevations Holding furosemide. Not on an ACE-I/ARB as outaptient.  -  Acceptable control     LOS: 5 Herschel Fleagle 8/8/20176:21 PM

## 2016-05-09 NOTE — Progress Notes (Signed)
Okay per Dr. Margaretmary Eddy to remove PICC line as pt is being discharged home

## 2016-05-09 NOTE — Evaluation (Signed)
Physical Therapy Evaluation Patient Details Name: [name] MRN: [id number] DOB: 1933-12-11 Today's Date: 05/09/2016   History of Present Illness  Pt is a 82 y/o female admitted with acute renal failure. PMH includes essential HTN, T2DM, CAD, and GERD.  Clinical Impression  Pt is a pleasant 82 y/o female who presents with difficulty walking. PLOF: Pt was independent with home ambulation, using counters and furniture to assist with moving around home. Pt reports she has a 2WRW at home, but there is not enough space to navigate it at home. Pt was independent with ADLs as well. Prior to admission, pt was receiving HHPT, pt states it was very beneficial. Pt requires min guard for transfers and ambulation (40 ft). Pt demonstrates safe sit/stand technique, no LOB noted upon standing. Pt requires minimal verbal cueing to keep RW close to body. Pt demonstrates slight R lateral lean with ambulation, pt reports this is due to prior CVA. Attempted increased distance for gait, pt self-limiting, explains she does not want to wear herself out before discharge. Reinforced importance of using RW for increased stability and decreasing fall risk with household ambulation. Pt will benefit from skilled PT services in order to improve strength, balance, functional mobility, and safety with DME use. At this time, pt is appropriate for HHPT to achieve goals.     Follow Up Recommendations Home health PT    Equipment Recommendations  None recommended by PT (Pt has 2WRW at home)    Recommendations for Other Services       Precautions / Restrictions Precautions Precautions: Fall Restrictions Weight Bearing Restrictions: No      Mobility  Bed Mobility               General bed mobility comments: PT in chair upon PT arrival. Bed mobility not attempted.  Transfers Overall transfer level: Needs assistance Equipment used: Rolling walker (2 wheeled) Transfers: Sit to/from Stand Sit to Stand: Min  guard         General transfer comment: Pt demonstrates safe sit/stand technique with no LOB noted.   Ambulation/Gait Ambulation/Gait assistance: Min guard Ambulation Distance (Feet): 40 Feet Assistive device: Rolling walker (2 wheeled) Gait Pattern/deviations: Decreased step length - right;Decreased step length - left;Decreased stride length     General Gait Details: Pt ambulates with R lateral lean, per pt this is from previous CVA. Pt requires verbal cueing to keep RW close to body.   Stairs            Wheelchair Mobility    Modified Rankin (Stroke Patients Only)       Balance Overall balance assessment: Needs assistance Sitting-balance support: Feet supported Sitting balance-Leahy Scale: Good     Standing balance support: Bilateral upper extremity supported Standing balance-Leahy Scale: Good Standing balance comment: Pt able to maintain standing balance with B UE support on RW                             Pertinent Vitals/Pain Pain Assessment: No/denies pain    Home Living Family/patient expects to be discharged to:: Private residence Living Arrangements: Spouse/significant other Available Help at Discharge: Family Type of Home: House Home Access: Stairs to enter Entrance Stairs-Rails: Can reach both Entrance Stairs-Number of Steps: 1 Home Layout: One level Home Equipment: Walker - 2 wheels      Prior Function Level of Independence: Independent with assistive device(s)         Comments: Pt reports she  has a 2WRW at home but does not use it because there is not a lot of room in house to navigate walker. She is able to hold onto counters to get around house. No community ambulation.      Hand Dominance        Extremity/Trunk Assessment   Upper Extremity Assessment: Overall WFL for tasks assessed (UE grossly 5/5)           Lower Extremity Assessment: Generalized weakness (LE grossly 4/5)         Communication    Communication: No difficulties  Cognition Arousal/Alertness: Awake/alert Behavior During Therapy: WFL for tasks assessed/performed Overall Cognitive Status: Within Functional Limits for tasks assessed                      General Comments      Exercises Other Exercises Other Exercises: Standing ther ex: B marches, B hip abd/add, and B heel raises x 10 reps with verbal and visual cueing and CGA.      Assessment/Plan    PT Assessment Patient needs continued PT services  PT Diagnosis Difficulty walking;Generalized weakness   PT Problem List Decreased strength;Decreased range of motion;Decreased activity tolerance;Decreased balance;Decreased mobility;Decreased knowledge of use of DME  PT Treatment Interventions DME instruction;Gait training;Stair training;Functional mobility training;Therapeutic activities;Therapeutic exercise;Balance training;Patient/family education   PT Goals (Current goals can be found in the Care Plan section) Acute Rehab PT Goals Patient Stated Goal: To return home PT Goal Formulation: With patient Time For Goal Achievement: 05/23/16 Potential to Achieve Goals: Good    Frequency Min 2X/week   Barriers to discharge        Co-evaluation               End of Session Equipment Utilized During Treatment: Gait belt Activity Tolerance: Patient tolerated treatment well Patient left: in chair;with call bell/phone within reach;with chair alarm set;with family/visitor present Nurse Communication: Mobility status         Time: 1131-1148 PT Time Calculation (min) (ACUTE ONLY): 17 min   Charges:         PT G Codes:        Georgina Pillion 2016/05/14, 12:46 PM  Georgina Pillion, SPT [phone]

## 2016-05-09 NOTE — Progress Notes (Signed)
05/09/2016 8:39 AM   [name] 08-10-1934 [id number]  Referring provider: Anthonette Legato, MD [place], [place] [address]  Chief Complaint  Patient presents with  . Routine Post Op    one week urinary retention    HPI: Patient is a 82 year old Caucasian female who presents today after being discharged from [facility] for ARF.  Background history 82 yo admitted with hypoglycemia. Her baseline Cr is 2. She was treated for UTI with Bactrim. Her Cr rose to 4.35 and renal u/s was done yesterday which showed mild bilateral hydronephrosis left greater than right. The bladder was distended with about a liter of urine and a Foley catheter was placed. Since The catheter was placed she said about 3 L of urine output. The patient reports she was having a lot of trouble voiding in the bed and on the bedpan and was not able to empty. She reports she typically voids with a good stream at home and feels empty. She denies any prior hematuria or history of kidney stones. She has a history of hysterectomy but no other pelvic surgery or radiation. Neurogenic risk includes diabetes.  She underwent a kidney biopsy yesterday.   She presents today to have her foley catheter removed.  She states that she is had no difficulty urinating at home and she is optimistic that she will be able to continue voiding without difficulty at home.  She denies any dysuria, gross hematuria or suprapubic pain.  She denies any fevers, chills, nausea or vomiting.   PMH: Past Medical History:  Diagnosis Date  . Angina pectoris ([place]) 08/26/2012  . Arthritis   . Breast mass, right   . Cancer [facility])    breast cancer, right side 2012  . Complication of anesthesia   . Coronary artery disease 08/22/2012   sees Dr Rockey Situ  . Diabetes ([place])   . Gastritis    hx of  . GERD (gastroesophageal reflux disease)   . Headache(784.0)    migraines  . History of breast cancer    39 treatments of radiation. Negative  chemo.  Marland Kitchen History of seasonal allergies   . Hyperlipemia   . Hypertension    sees Dr. Fulton Reek  . Pneumonia    hx of  . PONV (postoperative nausea and vomiting)   . 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 Stroke ([place])   . Thyroid disease   . Vertigo     Surgical History: Past Surgical History:  Procedure Laterality Date  . ABDOMINAL HYSTERECTOMY    . APPENDECTOMY    . BACK SURGERY    . BREAST BIOPSY Right    2012 positive  . BREAST EXCISIONAL BIOPSY Right    2012 positive  . BREAST SURGERY    . CARDIAC CATHETERIZATION  2014  . CORONARY ARTERY BYPASS GRAFT  09/05/2012   Procedure: CORONARY ARTERY BYPASS GRAFTING (CABG);  Surgeon: Rexene Alberts, MD;  Location: [place];  Service: Open Heart Surgery;  Laterality: N/A;  CABG x four, using left internal mammary artery and bilateral greater saphenous vein harvested endoscopically  . INTRAOPERATIVE TRANSESOPHAGEAL ECHOCARDIOGRAM  09/05/2012   Procedure: INTRAOPERATIVE TRANSESOPHAGEAL ECHOCARDIOGRAM;  Surgeon: Rexene Alberts, MD;  Location: [place];  Service: Open Heart Surgery;  Laterality: N/A;  . LAPAROSCOPIC NISSEN FUNDOPLICATION    . OVARIAN CYST REMOVAL  Home Medications:    Medication List       Accurate as of 05/09/16 11:59 PM. Always use your most recent med list.          ALPRAZolam 0.5 MG tablet Commonly known as:  XANAX Take 0.5 mg by mouth 2 (two) times daily as needed for anxiety.   amLODipine 10 MG tablet Commonly known as:  NORVASC Take 1 tablet (10 mg total) by mouth daily.   aspirin 325 MG EC tablet Take 325 mg by mouth daily.   CALCIUM 500 + D PO Take 1 tablet by mouth daily.   cloNIDine 0.2 MG tablet Commonly known as:  CATAPRES Take 1 tablet (0.2 mg total) by mouth 3 (three) times daily.   cyanocobalamin 1000 MCG/ML injection Commonly known as:  (VITAMIN B-12) Inject 1,000 mcg into the muscle every 30 (thirty) days.     Fish Oil 1000 MG Caps Take 1,000 mg by mouth daily.   FISH OIL PO Take by mouth.   gabapentin 100 MG capsule Commonly known as:  NEURONTIN Take 100 mg by mouth 2 (two) times daily.   hydrALAZINE 100 MG tablet Commonly known as:  APRESOLINE Take 1 tablet (100 mg total) by mouth 4 (four) times daily.   HYDROcodone-acetaminophen 5-325 MG tablet Commonly known as:  NORCO/VICODIN Take 1-2 tablets by mouth every 4 (four) hours as needed for moderate pain.   levothyroxine 175 MCG tablet Commonly known as:  SYNTHROID, LEVOTHROID Take 175 mcg by mouth daily before breakfast.   metoprolol 50 MG tablet Commonly known as:  LOPRESSOR Take 1 tablet (50 mg total) by mouth every 8 (eight) hours.   predniSONE 20 MG tablet Commonly known as:  DELTASONE Take 1 tablet (20 mg total) by mouth daily with breakfast. Take 3 tablets to total 60 mg per day  For 2 weeks       Allergies:  Allergies  Allergen Reactions  . Ciprocinonide [Fluocinolone]     Feels crazy  . Darvocet [Propoxyphene N-Acetaminophen]     Difficulty breathing and swallowing  . Duratuss G [Guaifenesin]     Other reaction(s): Unknown  . Ivp Dye [Iodinated Diagnostic Agents]     Nausea & vomiting  . Ketek  [Telithromycin]     Other reaction(s): Unknown  . Levaquin  [Levofloxacin] Nausea Only  . Other     Reports all cholesterol medication  . Statins     Muscle aches    Family History: Family History  Problem Relation Age of Onset  . Heart disease Brother     CABG & stents  . Hyperlipidemia Brother   . Hypertension Brother   . Cancer Father   . Heart disease Mother   . Breast cancer Neg Hx   . Kidney disease Neg Hx     Social History:  reports that she quit smoking about 27 years ago. Her smoking use included Cigarettes. She has never used smokeless tobacco. She reports that she does not drink alcohol or use drugs.  ROS: UROLOGY Frequent Urination?: No Hard to postpone urination?: No Burning/pain with  urination?: No Get up at night to urinate?: No Leakage of urine?: No Urine stream starts and stops?: No Trouble starting stream?: No Do you have to strain to urinate?: No Blood in urine?: No Urinary tract infection?: No Sexually transmitted disease?: No Injury to kidneys or bladder?: No Painful intercourse?: No Weak stream?: No Currently pregnant?: No Vaginal bleeding?: No Last menstrual period?: n  Gastrointestinal Nausea?: No Vomiting?: No Indigestion/heartburn?:  No Diarrhea?: No Constipation?: No  Constitutional Fever: No Night sweats?: No Weight loss?: No Fatigue?: No  Skin Skin rash/lesions?: No Itching?: No  Eyes Blurred vision?: No Double vision?: No  Ears/Nose/Throat Sore throat?: No Sinus problems?: No  Hematologic/Lymphatic Swollen glands?: No Easy bruising?: No  Cardiovascular Leg swelling?: No Chest pain?: No  Respiratory Cough?: No Shortness of breath?: No  Endocrine Excessive thirst?: No  Musculoskeletal Back pain?: No Joint pain?: No  Neurological Headaches?: No Dizziness?: No  Psychologic Depression?: No Anxiety?: No  Physical Exam: BP (!) 174/81   Pulse 95   Ht 5\' 4"  (1.626 m)   Wt 205 lb 3.2 oz (93.1 kg)   BMI 35.22 kg/m   Constitutional: Well nourished. Alert and oriented, No acute distress. HEENT: [place] AT, moist mucus membranes. Trachea midline, no masses. Cardiovascular: No clubbing, cyanosis, or edema. Respiratory: Normal respiratory effort, no increased work of breathing. GI: Abdomen is obese, soft, non tender, non distended, no abdominal masses. Liver and spleen not palpable.  No hernias appreciated.  Stool sample for occult testing is not indicated.   GU: No CVA tenderness.  No bladder fullness or masses.  Atrophic external genitalia, normal pubic hair distribution, no lesions.  Normal urethral meatus, no lesions, no prolapse, no discharge.   No urethral masses, tenderness and/or tenderness. No bladder fullness,  tenderness or masses. Pale vagina mucosa, poor estrogen effect, no discharge, no lesions, good pelvic support, no cystocele or rectocele noted.  Cervix and uterus are surgically absent.  No pelvic masses or tenderness noted.  Anus and perineum are without rashes or lesions.    Skin: No rashes, bruises or suspicious lesions. Lymph: No cervical or inguinal adenopathy. Neurologic: Grossly intact, no focal deficits, moving all 4 extremities. Psychiatric: Normal mood and affect.  Laboratory Data: Lab Results  Component Value Date   WBC 9.6 05/08/2016   HGB 7.4 (L) 05/08/2016   HCT 21.3 (L) 05/08/2016   MCV 83.9 05/08/2016   PLT 252 05/08/2016    Lab Results  Component Value Date   CREATININE 3.43 (H) 05/09/2016     Lab Results  Component Value Date   TSH 3.690 04/27/2016       Component Value Date/Time   CHOL 154 09/09/2012 0344   HDL 31 (L) 09/09/2012 0344   CHOLHDL 5.0 09/09/2012 0344   VLDL 31 09/09/2012 0344   LDLCALC 92 09/09/2012 0344    Lab Results  Component Value Date   AST 24 04/27/2016   Lab Results  Component Value Date   ALT 11 (L) 04/27/2016    Catheter Removal  Patient is present today for a catheter removal.  10 ml of water was drained from the balloon. A 16 FR foley cath was removed from the bladder without complications. Patient tolerated well.  Preformed by: Zara Council, PA-C  Continuous Intermittent Catheterization  Due to urinary retention patient is present today for a teaching of self I & O Catheterization. Patient was given detailed verbal and printed instructions of self catheterization. Patient was cleaned and prepped in a sterile fashion.  With instruction and assistance patient inserted a 14 FR and urine return was noted 30 ml, urine was yellow in color. Patient tolerated well.  Patient was given a sample bag with supplies to take home.  Instructions were given per me for patient to cath if she is unable to void.  Patient is to follow up  in one week.    Additional Notes: Patient did demonstrate her ability to  cath herself, but I feel she is a bit too optimistic about her chances urinating on her own.     Pertinent Imaging: CLINICAL DATA:  Hydronephrosis.  Follow-up.  EXAM: RENAL / URINARY TRACT ULTRASOUND COMPLETE  COMPARISON:  04/30/2016  FINDINGS: Right Kidney:  Length: 12.3 cm. Mildly echogenic renal parenchyma. No hydronephrosis or focal renal mass.  Left Kidney:  Length: 12.1 cm. Echogenicity within normal limits. Mildly prominent renal pelvis is noted, showing interval improvement compared prior study.  Bladder:  Foley catheter.  IMPRESSION: 1. Interval resolution of right hydronephrosis. 2. Improved left hydronephrosis. 3. Foley catheter.   Electronically Signed   By: Nolon Nations M.D.   On: 05/01/2016 17:24   Assessment & Plan:    1. Urinary retention  - patient is instructed to CIC if she is unable to void  - encouraged to contact the office with questions or difficulty with cathing  - encouraged to seek treatment in the ED after office hours if unable to cath  - follow up in one week for PVR and symptom recheck  2. History of ARF  - managed by nephrology  - emphasized to the patient to not go more than two hours without voiding or cathing while awake to keep residuals low  Return in about 1 week (around 05/16/2016) for PVR and symptom recheck.  These notes generated with voice recognition software. I apologize for typographical errors.  Zara Council, [facility] [address] [place], [place] [address] [phone]

## 2016-05-10 LAB — TYPE AND SCREEN
ABO/RH(D): O POS
Antibody Screen: NEGATIVE
Unit division: 0

## 2016-05-10 LAB — PREPARE RBC (CROSSMATCH)

## 2016-05-11 LAB — SURGICAL PATHOLOGY

## 2016-05-13 NOTE — Discharge Summary (Signed)
[name], is a 82 y.o. female  DOB 02-25-1934  MRN [id number].  Admission date:  05/04/2016  Admitting Physician  Lytle Butte, MD  Discharge Date:  05/09/2016   Primary MD  LATEEF, Inocente Salles, MD  Recommendations for primary care physician for things to follow:  Follow up with PCP in one week Follow up with nephro  In one week  Admission Diagnosis  Acute Renal Failure Hydronephrosis   Discharge Diagnosis  Acute Renal Failure Hydronephrosis    Active Problems:   Acute renal failure (ARF) ([place])      Past Medical History:  Diagnosis Date  . Angina pectoris ([place]) 08/26/2012  . Arthritis   . Breast mass, right   . Cancer [facility])    breast cancer, right side 2012  . Complication of anesthesia   . Coronary artery disease 08/22/2012   sees Dr Rockey Situ  . Diabetes ([place])   . Gastritis    hx of  . GERD (gastroesophageal reflux disease)   . Headache(784.0)    migraines  . History of breast cancer    39 treatments of radiation. Negative chemo.  Marland Kitchen History of seasonal allergies   . Hyperlipemia   . Hypertension    sees Dr. Fulton Reek  . Pneumonia    hx of  . PONV (postoperative nausea and vomiting)   . 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 Stroke ([place])   . Thyroid disease   . Vertigo     Past Surgical History:  Procedure Laterality Date  . ABDOMINAL HYSTERECTOMY    . APPENDECTOMY    . BACK SURGERY    . BREAST BIOPSY Right    2012 positive  . BREAST EXCISIONAL BIOPSY Right    2012 positive  . BREAST SURGERY    . CARDIAC CATHETERIZATION  2014  . CORONARY ARTERY BYPASS GRAFT  09/05/2012   Procedure: CORONARY ARTERY BYPASS GRAFTING (CABG);  Surgeon: Rexene Alberts, MD;  Location: [place];  Service: Open Heart Surgery;  Laterality: N/A;   CABG x four, using left internal mammary artery and bilateral greater saphenous vein harvested endoscopically  . INTRAOPERATIVE TRANSESOPHAGEAL ECHOCARDIOGRAM  09/05/2012   Procedure: INTRAOPERATIVE TRANSESOPHAGEAL ECHOCARDIOGRAM;  Surgeon: Rexene Alberts, MD;  Location: [place];  Service: Open Heart Surgery;  Laterality: N/A;  . LAPAROSCOPIC NISSEN FUNDOPLICATION    . OVARIAN CYST REMOVAL         History of present illness and  Hospital Course:     Kindly see H&P for history of present illness and admission details, please review complete Labs, Consult reports and Test reports for all details in brief  HPI  from the history and physical done on the day of admission 82 yr old female with h/o dm,CKD stage 2 presents with abnormal labs. With acute on chronic renal failure.   Hospital Course  Acute on chronic renal failure.s CKD stage 4 ;seen by PMD  With positve  ANA,ANCA;,highly concerning for microscopic poly angitis.acceped the pt as direct admit .statred on pulse steroids with solumedrol 500 iv daily for 3 days.Pt had kidney biopsy by nephrology on 05/08/16. And discharged home with prednisone 60 mg po daily  Until she follows up with nephro for biopsy,Cr improved from 4.01 to 3.65 results,CT chest showed ground glass attenuation. Discharged home with foley and she is supposed to follow with DR.Erlene Quan as an  Out pt  For voiding trial. Previous hospitalization significant for hydronephrosis./hypoglycemia.  2.HTNsome elevation.held lasix.not on ACE  Or ARM as out pt 3.hypothyrodism;continue Synthyroid HLP;continue  Statins  Discharged home with Home PT/RN/Aid Discharge Condition:stable   Follow UP   f/u nephro   Discharge Instructions  and  Discharge Medications     Discharge Instructions    Face-to-face encounter (required for Medicare/Medicaid patients)    Complete by:  As directed   I Stefana Lodico certify that this patient is under my care and that I, or a nurse  practitioner or physician's assistant working with me, had a face-to-face encounter that meets the physician face-to-face encounter requirements with this patient on 05/09/2016. The encounter with the patient was in whole, or in part for the following medical condition(s) which is the primary reason for home health care  Chronic renl failure Glomerulonephritis htn DMII   The encounter with the patient was in whole, or in part, for the following medical condition, which is the primary reason for home health care:  whole   I certify that, based on my findings, the following services are medically necessary home health services:   Nursing Physical therapy     Reason for Medically Necessary Home Health Services:  Skilled Nursing- Skilled Assessment/Observation   My clinical findings support the need for the above services:  Unable to leave home safely without assistance and/or assistive device   Further, I certify that my clinical findings support that this patient is homebound due to:  Unable to leave home safely without assistance   Home Health    Complete by:  As directed   To provide the following care/treatments:   PT [place] A[facility]         Medication List    TAKE these medications   ALPRAZolam 0.5 MG tablet Commonly known as:  XANAX Take 0.5 mg by mouth 2 (two) times daily as needed for anxiety.   amLODipine 10 MG tablet Commonly known as:  NORVASC Take 1 tablet (10 mg total) by mouth daily.   aspirin 325 MG EC tablet Take 325 mg by mouth daily.   CALCIUM 500 + D PO Take 1 tablet by mouth daily.   cloNIDine 0.2 MG tablet Commonly known as:  CATAPRES Take 1 tablet (0.2 mg total) by mouth 3 (three) times daily.   cyanocobalamin 1000 MCG/ML injection Commonly known as:  (VITAMIN B-12) Inject 1,000 mcg into the muscle every 30 (thirty) days.   Fish Oil 1000 MG Caps Take 1,000 mg by mouth daily.   gabapentin 100 MG capsule Commonly known as:  NEURONTIN Take 100 mg by  mouth 2 (two) times daily.   hydrALAZINE 100 MG tablet Commonly known as:  APRESOLINE Take 1 tablet (100 mg total) by mouth 4 (four) times daily.   HYDROcodone-acetaminophen 5-325 MG tablet Commonly known as:  NORCO/VICODIN Take 1-2 tablets by mouth every 4 (four) hours as needed for moderate pain.   levothyroxine 175 MCG tablet Commonly known as:  SYNTHROID, LEVOTHROID Take 175 mcg by mouth daily before breakfast.   metoprolol 50 MG tablet Commonly known as:  LOPRESSOR Take 1 tablet (50 mg total) by mouth every 8 (eight) hours.   predniSONE 20 MG tablet Commonly known as:  DELTASONE Take 1 tablet (20 mg total) by mouth daily with breakfast. Take 3 tablets to total 60 mg per day  For 2 weeks         Diet and Activity recommendation: See Discharge Instructions above   Consults obtained -  nephro   Major procedures and Radiology Reports - PLEASE review detailed and final reports for all details, in brief -     Dg Chest 1 View  Result Date: 05/01/2016 CLINICAL DATA:  Central chest pain and cough for 2 days EXAM: CHEST 1 VIEW COMPARISON:  04/28/2016 FINDINGS: Cardiac shadow remains enlarged. Postsurgical changes are again noted. There is been some improvement in the degree of interstitial changes consistent with improving edema. No focal infiltrate or sizable effusion is seen. No bony abnormality is noted. IMPRESSION: Improved interstitial edema when compared with the prior exam. Electronically Signed   By: Inez Catalina M.D.   On: 05/01/2016 11:51  Dg Chest 1 View  Result Date: 04/28/2016 CLINICAL DATA:  Nausea. EXAM: CHEST 1 VIEW COMPARISON:  04/27/2016 FINDINGS: Postsurgical changes from CABG stable. The cardiac silhouette is mildly enlarged. Mediastinal contours appear intact. There is no evidence of focal airspace consolidation, pleural effusion or pneumothorax. There is coarsening of the interstitial markings, not noted on the comparison radiograph, likely representing  development of interstitial pulmonary edema. Osseous structures are without acute abnormality. Soft tissues are grossly normal. IMPRESSION: Interval development of coarsening of the interstitial markings, likely representing development of interstitial pulmonary edema. Mild cardiomegaly. Electronically Signed   By: Fidela Salisbury M.D.   On: 04/28/2016 17:34  Dg Abd 1 View  Result Date: 04/29/2016 CLINICAL DATA:  Ileus EXAM: ABDOMEN - 1 VIEW COMPARISON:  April 28, 2016 FINDINGS: Fecal loading is seen in the right colon. Gas is seen in the colon to the level of the rectum. Other air-filled loops of bowel are not dilated. No other interval changes. IMPRESSION: 1. Fecal loading in the right colon. 2. No evidence of obstruction. No dilated loops of bowel to suggest obstruction or ileus. Electronically Signed   By: Dorise Bullion III M.D   On: 04/29/2016 07:15  Dg Abd 1 View  Result Date: 04/28/2016 CLINICAL DATA:  Abdominal distention. EXAM: ABDOMEN - 1 VIEW COMPARISON:  None. FINDINGS: Multiple gas-filled nondilated small bowel loops are seen within the abdomen or pelvis. There is also colonic gas and stool to the level the rectum, without evidence of colonic dilatation. No radiopaque calculi identified. IMPRESSION: Nonspecific, nonobstructive bowel gas pattern. Mild ileus cannot be excluded. Electronically Signed   By: Earle Gell M.D.   On: 04/28/2016 12:41  Ct Chest Wo Contrast  Result Date: 05/04/2016 CLINICAL DATA:  Nonproductive cough. EXAM: CT CHEST WITHOUT CONTRAST TECHNIQUE: Multidetector CT imaging of the chest was performed following the standard protocol without IV contrast. COMPARISON:  None. FINDINGS: Cardiovascular: Heart is enlarged. Patient is status post CABG. No thoracic aortic aneurysm. Mediastinum/Nodes: Small lymph nodes scattered in the mediastinum do not meet CT criteria for pathologic enlargement. No evidence for gross hilar lymphadenopathy although assessment is limited by the  lack of intravenous contrast on today's study. The esophagus has normal imaging features. There is no axillary lymphadenopathy. Lungs/Pleura: Interlobular septal thickening noted in the upper lobes with patchy ground-glass attenuation having a somewhat mosaic configuration. Atelectasis noted in the lower lobes. No dense focal airspace consolidation. Thickening or fluid is seen along the cranial aspect of the left major fissure. There is dependent atelectasis in the lower lungs bilaterally. Upper Abdomen: Unremarkable. Musculoskeletal: Bone windows reveal no worrisome lytic or sclerotic osseous lesions. IMPRESSION: Interlobular septal thickening with patchy ground-glass attenuation. A component of pulmonary edema is likely although underlying chronic small airway disease could also contribute to this appearance. Bibasilar atelectasis. Confluent soft tissue attenuation in  the cranial aspect the left major fissure may represent fluid. Consider repeat CT after resolution of acute symptoms to ensure resolution. Electronically Signed   By: Misty Stanley M.D.   On: 05/04/2016 16:07   US Renal  Result Date: 05/01/2016 CLINICAL DATA:  Hydronephrosis.  Follow-up. EXAM: RENAL / URINARY TRACT ULTRASOUND COMPLETE COMPARISON:  04/30/2016 FINDINGS: Right Kidney: Length: 12.3 cm. Mildly echogenic renal parenchyma. No hydronephrosis or focal renal mass. Left Kidney: Length: 12.1 cm. Echogenicity within normal limits. Mildly prominent renal pelvis is noted, showing interval improvement compared prior study. Bladder: Foley catheter. IMPRESSION: 1. Interval resolution of right hydronephrosis. 2. Improved left hydronephrosis. 3. Foley catheter. Electronically Signed   By: Nolon Nations M.D.   On: 05/01/2016 17:24   US Renal  Result Date: 04/30/2016 CLINICAL DATA:  Acute renal failure. EXAM: RENAL / URINARY TRACT ULTRASOUND COMPLETE COMPARISON:  None. FINDINGS: Right Kidney: Length: 12.6 cm. Moderate hydronephrosis with  increased renal echogenicity identified. There is no evidence of solid renal mass. Left Kidney: Length: 12.8 cm. Moderate hydronephrosis with increased renal echogenicity identified. There is no evidence of solid mass. Bladder: The bladder is distended. IMPRESSION: Bladder distention with moderate bilateral hydronephrosis. Increased bilateral renal echogenicity compatible with medical renal disease. Electronically Signed   By: Margarette Canada M.D.   On: 04/30/2016 15:26  US Biopsy  Result Date: 05/08/2016 INDICATION: 82 year old with acute on chronic renal failure. EXAM: ULTRASOUND GUIDED RENAL CORE BIOPSY MEDICATIONS: None. ANESTHESIA/SEDATION: Fentanyl 50 mcg IV; Versed 1 mg IV Moderate Sedation Time:  15 minutes The patient was continuously monitored during the procedure by the interventional radiology nurse under my direct supervision. FLUOROSCOPY TIME:  None COMPLICATIONS: None immediate. PROCEDURE: The procedure was explained to the patient. The risks and benefits of the procedure were discussed and the patient's questions were addressed. Informed consent was obtained from the patient. Patient was scheduled for renal biopsy earlier in the day. However, the patient's blood pressure remained markedly elevated despite moderate sedation. Therefore, the patient went back to the floor and received her scheduled blood pressure medications. After the patient's blood pressure had come down, the patient was returned Radiology for the biopsy procedure. Patient's blood pressure was suitable for a biopsy. Patient was placed prone. Ultrasound of the kidneys was performed. The right kidney was selected for biopsy. The right flank was prepped with chlorhexidine and sterile field was created. Skin and soft tissues were anesthetized with 1% lidocaine. Using ultrasound guidance, 16 gauge core needle was used to biopsy the lower pole cortex. Adequate specimen was obtained. A second ultrasound-guided core biopsy obtained with a 16  gauge needle from the lower pole cortex. Again, an adequate core sample was obtained. Bandage placed over the puncture site. FINDINGS: Small cortical cyst in the right kidney lower pole. Core biopsies obtained from the lower pole cortex. No evidence for hematoma formation or significant bleeding following the core biopsies. IMPRESSION: Successful ultrasound-guided core biopsies of the right kidney lower pole. Electronically Signed   By: Markus Daft M.D.   On: 05/08/2016 17:22   Dg Chest Port 1 View  Result Date: 05/04/2016 CLINICAL DATA:  Status post PICC placement. EXAM: PORTABLE CHEST 1 VIEW COMPARISON:  05/01/2016 and chest CTA obtained earlier today. FINDINGS: Stable enlarged cardiac silhouette post CABG changes. Interval left PICC with its tip in the superior vena cava. Mildly prominent interstitial markings with slight improvement. No definite pleural fluid. Diffuse osteopenia. IMPRESSION: 1. Left PICC tip in the superior vena cava. If a position at  the superior cavoatrial junction is desired, it would be recommended that this be advanced 4 cm. 2. Further improvement in interstitial pulmonary edema. 3. Stable cardiomegaly Electronically Signed   By: Claudie Revering M.D.   On: 05/04/2016 17:24   Dg Chest Port 1 View  Result Date: 04/27/2016 CLINICAL DATA:  Recent episode of hypoglycemia EXAM: PORTABLE CHEST 1 VIEW COMPARISON:  07/21/2015 FINDINGS: Cardiac shadow remains enlarged. Postsurgical changes are again seen. The lungs are clear bilaterally. No bony abnormality is seen. IMPRESSION: No acute abnormality noted. Electronically Signed   By: Inez Catalina M.D.   On: 04/27/2016 23:29   Micro Results    No results found for this or any previous visit (from the past 240 hour(s)).     Today   Subjective:   [name] today has no headache,no chest abdominal pain,no new weakness tingling or numbness, feels much better wants to go home today.  Objective:   Blood pressure 140/78, pulse 78,  temperature 97.7 F (36.5 C), temperature source Oral, resp. rate (!) 24, height 5\' 5"  (1.651 m), weight 94.8 kg (209 lb), SpO2 99 %.  No intake or output data in the 24 hours ending 05/13/16 2233  Exam Awake Alert, Oriented x 3, No new F.N deficits, Normal affect [place].AT,PERRAL Supple Neck,No JVD, No cervical lymphadenopathy appriciated.  Symmetrical Chest wall movement, Good air movement bilaterally, CTAB RRR,No Gallops,Rubs or new Murmurs, No Parasternal Heave +ve B.Sounds, Abd Soft, Non tender, No organomegaly appriciated, No rebound -guarding or rigidity. No Cyanosis, Clubbing or edema, No new Rash or bruise  Data Review   CBC w Diff:  Lab Results  Component Value Date   WBC 9.6 05/08/2016   HGB 7.4 (L) 05/08/2016   HGB 12.7 05/07/2014   HCT 21.3 (L) 05/08/2016   HCT 37.2 05/07/2014   PLT 252 05/08/2016   PLT 224 05/07/2014   LYMPHOPCT 3 04/27/2016   LYMPHOPCT 17.5 05/07/2014   MONOPCT 4 04/27/2016   MONOPCT 8.4 05/07/2014   EOSPCT 0 04/27/2016   EOSPCT 0.2 05/07/2014   BASOPCT 1 04/27/2016   BASOPCT 0.2 05/07/2014    CMP:  Lab Results  Component Value Date   NA 139 05/09/2016   NA 140 11/22/2015   NA 142 05/07/2014   K 4.3 05/09/2016   K 3.2 (L) 05/07/2014   CL 104 05/09/2016   CL 104 05/07/2014   CO2 26 05/09/2016   CO2 29 05/07/2014   BUN 102 (H) 05/09/2016   BUN 35 (H) 11/22/2015   BUN 26 (H) 05/07/2014   CREATININE 3.43 (H) 05/09/2016   CREATININE 1.13 05/07/2014   PROT 7.8 04/27/2016   PROT 8.0 05/03/2014   ALBUMIN 2.6 (L) 04/27/2016   ALBUMIN 2.9 (L) 05/03/2014   BILITOT 0.3 04/27/2016   BILITOT 0.3 05/03/2014   ALKPHOS 90 04/27/2016   ALKPHOS 109 05/03/2014   AST 24 04/27/2016   AST 25 05/03/2014   ALT 11 (L) 04/27/2016   ALT 19 05/03/2014  .   Total Time in preparing paper work, data evaluation and todays exam - 31 minutes  Yulianna Folse M.D on 05/09/2016 at 10:33 PM    Note: This dictation was prepared with Dragon dictation  along with smaller phrase technology. Any transcriptional errors that result from this process are unintentional.

## 2016-05-16 ENCOUNTER — Ambulatory Visit: Payer: Medicare Other | Admitting: Urology

## 2016-05-22 ENCOUNTER — Ambulatory Visit: Payer: Medicare Other | Admitting: Cardiovascular Disease

## 2016-05-31 ENCOUNTER — Encounter: Payer: Self-pay | Admitting: Nephrology

## 2016-06-09 ENCOUNTER — Ambulatory Visit: Payer: Medicare Other

## 2016-06-09 ENCOUNTER — Other Ambulatory Visit: Payer: Self-pay | Admitting: Nephrology

## 2016-06-09 DIAGNOSIS — R600 Localized edema: Secondary | ICD-10-CM

## 2016-06-12 ENCOUNTER — Ambulatory Visit
Admission: RE | Admit: 2016-06-12 | Discharge: 2016-06-12 | Disposition: A | Discharge status: Home / Self Care | Payer: Medicare Other | Source: Ambulatory Visit | Attending: Nephrology | Admitting: Nephrology

## 2016-06-12 DIAGNOSIS — R600 Localized edema: Secondary | ICD-10-CM | POA: Diagnosis present

## 2016-08-21 ENCOUNTER — Other Ambulatory Visit: Payer: Self-pay | Admitting: Surgery

## 2016-08-21 DIAGNOSIS — Z853 Personal history of malignant neoplasm of breast: Secondary | ICD-10-CM

## 2016-08-30 ENCOUNTER — Encounter: Payer: Self-pay | Admitting: Cardiovascular Disease

## 2016-08-30 ENCOUNTER — Ambulatory Visit (INDEPENDENT_AMBULATORY_CARE_PROVIDER_SITE_OTHER): Payer: Medicare Other | Admitting: Cardiovascular Disease

## 2016-08-30 VITALS — BP 132/80 | HR 84 | Ht 64.0 in | Wt 197.2 lb

## 2016-08-30 DIAGNOSIS — I25709 Atherosclerosis of coronary artery bypass graft(s), unspecified, with unspecified angina pectoris: Secondary | ICD-10-CM | POA: Diagnosis not present

## 2016-08-30 DIAGNOSIS — Z951 Presence of aortocoronary bypass graft: Secondary | ICD-10-CM

## 2016-08-30 DIAGNOSIS — I10 Essential (primary) hypertension: Secondary | ICD-10-CM

## 2016-08-30 DIAGNOSIS — E782 Mixed hyperlipidemia: Secondary | ICD-10-CM | POA: Diagnosis not present

## 2016-08-30 DIAGNOSIS — R531 Weakness: Secondary | ICD-10-CM

## 2016-08-30 DIAGNOSIS — N1330 Unspecified hydronephrosis: Secondary | ICD-10-CM

## 2016-08-30 DIAGNOSIS — N178 Other acute kidney failure: Secondary | ICD-10-CM

## 2016-08-30 DIAGNOSIS — D649 Anemia, unspecified: Secondary | ICD-10-CM

## 2016-08-30 NOTE — Progress Notes (Signed)
Cardiology Office Note  Date:  08/30/2016   ID:  [name], DOB [birth year], MRN [id number]  PCP:  Idelle Crouch, MD   Chief Complaint  Patient presents with  . other    6 month f/u c/o chest discomfort. Meds reviewed verbally with pt.    HPI:  [name] is a very pleasant 82 yo woman with coronary artery disease, cardiac catheterization at [facility] 08/22/2012 showing severe distal left main, ostial LAD and circumflex disease also with RCA disease transferred to [facility] for a CABG x4, postsurgical stroke with right-sided deficits who presents for followup today of her coronary artery disease, renal failure She had postsurgical stroke has recovered well. LIMA to LAD, SVG to Diag, SVG to OM1, SVG to PDA, EVH from bilateral thighs  In follow-up today, we reviewed recent hospitalizations with her in detail She developed worsening anemia may into July and August, hematocrit down to 21 She developed acute renal failure, hydronephrosis in August 2017 Treated with steroids, Foley catheter Had renal biopsy Slow recovery since that time Was not eating much while in the hospital, lost over 20 pounds  Secondary to low blood pressure, clonidine and hydralazine held She reports that she was mad when she saw primary care, blood pressure was elevated 676 systolic At that time metoprolol dose was doubled  She continues to have chronic R> L lower extremity swelling Takes lasix for swelling on the left leg Watches sugars, take glimeperide for sugars >200  Hematocrit 21 now up to 36 Reports that she is on B-12 shots Chronic mild shortness of breath, still recovering, week Unable to walk very far without sitting down to rest, legs feel weak, poor conditioning Wearing compression hose daily  EKG on today's visit shows normal sinus rhythm with rate 84 bpm, poor R-wave progression through the anterior precordial leads, left axis deviation, nonspecific ST and T wave abnormality 1 and  aVL  Other past medical history reviewed   previously seen Dr. Lucky Cowboy, for venous insufficiency  Previously in the hospital for pneumonia/diastolic heart failure October 2016, had antibiotics and gentle diuresis   again reiterates that she does not want a statin   She does not want a statin  She reports that she has tried many medications and they have side effects She denies any significant lower extremity edema, no shortness of breath with exertion. She does report having some nasal stuffiness. Previously seen by ear nose throat, Dr. Tami Ribas She reports having some personality change since the surgery, more irritable Previous cholesterol was 210, LDL 111, hemoglobin A1c 6.6  Current cardiac catheterization report 08/22/2012 showed distal left main 70% disease, ostial LAD of 80%, proximal LAD 70%, distal LAD diffuse 70%, diagonal #150% followed by discrete 95% lesion, ostial circumflex 90% disease, proximal circumflex 70% disease, proximal RCA 50%, distal RCA 60% PDA branch 60%  PMH:   has a past medical history of Angina pectoris ([place]) (08/26/2012); Arthritis; Breast mass, right; Cancer ([place]); Complication of anesthesia; Coronary artery disease (08/22/2012); Diabetes ([place]); Gastritis; GERD (gastroesophageal reflux disease); Headache(784.0); History of breast cancer; History of seasonal allergies; Hyperlipemia; Hypertension; Pneumonia; PONV (postoperative nausea and vomiting); S/P CABG x 4 (09/05/2012); Stroke [facility]); Thyroid disease; and Vertigo.  PSH:    Past Surgical History:  Procedure Laterality Date  . ABDOMINAL HYSTERECTOMY    . APPENDECTOMY    . BACK SURGERY    . BREAST BIOPSY Right    2012 positive  . BREAST EXCISIONAL BIOPSY Right    2012 positive  .  BREAST SURGERY    . CARDIAC CATHETERIZATION  2014  . CORONARY ARTERY BYPASS GRAFT  09/05/2012   Procedure: CORONARY ARTERY BYPASS GRAFTING (CABG);  Surgeon: Rexene Alberts, MD;  Location: [place];  Service: Open Heart Surgery;   Laterality: N/A;  CABG x four, using left internal mammary artery and bilateral greater saphenous vein harvested endoscopically  . INTRAOPERATIVE TRANSESOPHAGEAL ECHOCARDIOGRAM  09/05/2012   Procedure: INTRAOPERATIVE TRANSESOPHAGEAL ECHOCARDIOGRAM;  Surgeon: Rexene Alberts, MD;  Location: [place];  Service: Open Heart Surgery;  Laterality: N/A;  . LAPAROSCOPIC NISSEN FUNDOPLICATION    . OVARIAN CYST REMOVAL      Current Outpatient Prescriptions  Medication Sig Dispense Refill  . ALPRAZolam (XANAX) 0.5 MG tablet Take 0.5 mg by mouth 2 (two) times daily as needed for anxiety.    Marland Kitchen amLODipine (NORVASC) 10 MG tablet Take 1 tablet (10 mg total) by mouth daily. 30 tablet 0  . aspirin 81 MG chewable tablet Chew 81 mg by mouth daily.    . Calcium Carbonate-Vitamin D (CALCIUM 500 + D PO) Take 1 tablet by mouth daily.    . cyanocobalamin (,VITAMIN B-12,) 1000 MCG/ML injection Inject 1,000 mcg into the muscle every 30 (thirty) days.    Marland Kitchen docusate sodium (COLACE) 100 MG capsule Take 100 mg by mouth daily.    . furosemide (LASIX) 40 MG tablet Take 40 mg by mouth daily as needed.    . gabapentin (NEURONTIN) 100 MG capsule Take 100 mg by mouth 2 (two) times daily.    Marland Kitchen HYDROcodone-acetaminophen (NORCO/VICODIN) 5-325 MG tablet Take 1-2 tablets by mouth every 4 (four) hours as needed for moderate pain. 30 tablet 0  . levothyroxine (SYNTHROID, LEVOTHROID) 175 MCG tablet Take 175 mcg by mouth daily before breakfast.    . metoprolol (LOPRESSOR) 50 MG tablet Take 1 tablet (50 mg total) by mouth every 8 (eight) hours. (Patient taking differently: Take 50 mg by mouth 2 (two) times daily. ) 90 tablet 5   No current facility-administered medications for this visit.      Allergies:   Ciprocinonide [fluocinolone]; Darvocet [propoxyphene n-acetaminophen]; Duratuss g [guaifenesin]; Ivp dye [iodinated diagnostic agents]; Ketek  [telithromycin]; Levaquin  [levofloxacin]; Other; and Statins   Social History:  The patient   reports that she quit smoking about 27 years ago. Her smoking use included Cigarettes. She has never used smokeless tobacco. She reports that she does not drink alcohol or use drugs.   Family History:   family history includes Cancer in her father; Heart disease in her brother and mother; Hyperlipidemia in her brother; Hypertension in her brother.    Review of Systems: Review of Systems  Constitutional: Negative.   Respiratory: Negative.   Cardiovascular: Negative.   Gastrointestinal: Negative.   Musculoskeletal: Negative.   Neurological: Negative.   Psychiatric/Behavioral: Negative.   All other systems reviewed and are negative.    PHYSICAL EXAM: VS:  BP 132/80 (BP Location: Left Arm, Patient Position: Sitting, Cuff Size: Large)   Pulse 84   Ht 5\' 4"  (1.626 m)   Wt 197 lb 4 oz (89.5 kg)   BMI 33.86 kg/m  , BMI Body mass index is 33.86 kg/m. GEN: Well nourished, well developed, in no acute distress  HEENT: normal  Neck: no JVD, carotid bruits, or masses Cardiac: RRR; 2+ SEM RSB,  no rubs, or gallops, trace pitting edema right lower extremity, minimal on the left Respiratory:  clear to auscultation bilaterally, normal work of breathing GI: soft, nontender, nondistended, + BS  MS: no deformity or atrophy  Skin: warm and dry, no rash Neuro:  Strength and sensation are intact Psych: euthymic mood, full affect    Recent Labs: 04/27/2016: ALT 11; TSH 3.690 05/08/2016: Hemoglobin 7.4; Platelets 252 05/09/2016: BUN 102; Creatinine, Ser 3.43; Potassium 4.3; Sodium 139    Lipid Panel Lab Results  Component Value Date   CHOL 154 09/09/2012   HDL 31 (L) 09/09/2012   LDLCALC 92 09/09/2012   TRIG 157 (H) 09/09/2012      Wt Readings from Last 3 Encounters:  08/30/16 197 lb 4 oz (89.5 kg)  05/04/16 209 lb (94.8 kg)  05/09/16 205 lb 3.2 oz (93.1 kg)       ASSESSMENT AND PLAN:  Essential hypertension - Plan: EKG 12-Lead Blood pressure within reasonable range on today's  visit Clonidine and hydralazine previously held for hypotension The metoprolol dose increased by primary care for hypertension, now within a good range. Long discussion with her that her medications did not cause her initial kidney problem. She was under the misconception that her blood pressure medications caused renal failure and of July, August  Coronary artery disease involving coronary bypass graft of native heart with angina pectoris ([place]) - Plan: EKG 12-Lead Currently with no symptoms of angina. No further workup at this time. Continue current medication regimen.  Mixed hyperlipidemia She has declined cholesterol medication  S/P CABG x 4 Denies any symptoms concerning for angina, no further testing  Acute renal failure with other specified pathological lesion in kidney [facility]) Long discussion with her concerning recent hospitalization, Some confusion about events from her hospital course Try to clarify any questions with her  Hydronephrosis, unspecified hydronephrosis type Had Foley catheter placed August 2017 by Dr. Erlene Quan  Currently without a Foley  Anemia, unspecified type  etiology of her anemia is unclear  Hematocrit up to 36 me low of 21 in August 2017 On B 12 shots  Weakness Still recovering from recent prolonged hospital course with associated weight loss, disability. Discussed a plan for continued exercise   Total encounter time more than 45 minutes  Greater than 50% was spent in counseling and coordination of care with the patient   Disposition:   F/U  6 months   Orders Placed This Encounter  Procedures  . EKG 12-Lead     Signed, Esmond Plants, M.D., Ph.D. 08/30/2016  [place], [place]

## 2016-08-30 NOTE — Patient Instructions (Addendum)
Medication Instructions:   No medication changes made  Labwork:  No new labs needed  Testing/Procedures:  Please call the office if you have chest tightness, worsening shortness of breath, We might order a stress test   I recommend watching educational videos on topics of interest to you at:       [email or web address]  Enter code: HEARTCARE    Follow-Up: It was a pleasure seeing you in the office today. Please call us if you have new issues that need to be addressed before your next appt.  [phone]  Your physician wants you to follow-up in: 6 months.  You will receive a reminder letter in the mail two months in advance. If you don't receive a letter, please call our office to schedule the follow-up appointment.  If you need a refill on your cardiac medications before your next appointment, please call your pharmacy.

## 2016-09-21 ENCOUNTER — Ambulatory Visit
Admission: RE | Admit: 2016-09-21 | Discharge: 2016-09-21 | Disposition: A | Discharge status: Home / Self Care | Payer: Medicare Other | Source: Ambulatory Visit | Attending: Surgery | Admitting: Surgery

## 2016-09-21 DIAGNOSIS — Z853 Personal history of malignant neoplasm of breast: Secondary | ICD-10-CM | POA: Insufficient documentation

## 2016-09-21 DIAGNOSIS — R921 Mammographic calcification found on diagnostic imaging of breast: Secondary | ICD-10-CM | POA: Diagnosis not present

## 2016-09-21 HISTORY — DX: Personal history of irradiation: Z92.3

## 2016-10-02 DIAGNOSIS — H3530 Unspecified macular degeneration: Secondary | ICD-10-CM

## 2016-10-02 HISTORY — DX: Unspecified macular degeneration: H35.30

## 2016-11-13 ENCOUNTER — Encounter: Payer: Self-pay | Admitting: Cardiovascular Disease

## 2017-02-20 NOTE — Progress Notes (Signed)
Cardiology Office Note  Date:  02/21/2017   ID:  [name], DOB [birth year], MRN [id number]  PCP:  Idelle Crouch, MD   Chief Complaint  Patient presents with  . other    6 month f/u c/o elevated BP. Meds reviewed verbally with pt.    HPI:  [name] is a very pleasant 83 yo woman with  coronary artery disease,  cardiac catheterization at [facility] 08/22/2012 showing severe distal left main, ostial LAD and circumflex disease also with RCA disease  CABG x4, LIMA to LAD, SVG to Diag, SVG to OM1, SVG to PDA, EVH from bilateral thighs postsurgical stroke with right-sided deficits Chronic renal insufficiency, hydronephrosis Anemia  who presents for followup today of her coronary artery disease, renal failure  Tried verapamil, had side effects, felt poorly Stopped the medication  Amlodipine stopped for leg swelling  Leg edema much improved  Lasix PRN For leg swelling only   clonidine and hydralazine previously held November 2017 for low BP (was taking norvasc at the time)   chronic R> L lower extremity swelling, now essentially resolved She's not wearing compression hose  Chronic mild shortness of breath, though feeling better, walking daily with her husband Still some weakness but slowly improving  EKG personally reviewed by myself on today's visit shows normal sinus rhythm with rate 80 bpm, poor R-wave progression through the anterior precordial leads, left axis deviation, no significant ST or T-wave changes  Other past medical history reviewed   previously seen Dr. Lucky Cowboy, for venous insufficiency  Previously in the hospital for pneumonia/diastolic heart failure October 2016, had antibiotics and gentle diuresis   again reiterates that she does not want a statin   She does not want a statin  She reports that she has tried many medications and they have side effects She denies any significant lower extremity edema, no shortness of breath with exertion. She does  report having some nasal stuffiness. Previously seen by ear nose throat, Dr. Tami Ribas She reports having some personality change since the surgery, more irritable Previous cholesterol was 210, LDL 111, hemoglobin A1c 6.6  Current cardiac catheterization report 08/22/2012 showed distal left main 70% disease, ostial LAD of 80%, proximal LAD 70%, distal LAD diffuse 70%, diagonal #150% followed by discrete 95% lesion, ostial circumflex 90% disease, proximal circumflex 70% disease, proximal RCA 50%, distal RCA 60% PDA branch 60%  PMH:   has a past medical history of Angina pectoris ([place]) (08/26/2012); Arthritis; Breast mass, right; Cancer ([place]); Complication of anesthesia; Coronary artery disease (08/22/2012); Diabetes ([place]); Gastritis; GERD (gastroesophageal reflux disease); Headache(784.0); History of breast cancer; History of seasonal allergies; Hyperlipemia; Hypertension; Personal history of radiation therapy; Pneumonia; PONV (postoperative nausea and vomiting); S/P CABG x 4 (09/05/2012); Stroke [facility]); Thyroid disease; and Vertigo.  PSH:    Past Surgical History:  Procedure Laterality Date  . ABDOMINAL HYSTERECTOMY    . APPENDECTOMY    . BACK SURGERY    . BREAST BIOPSY Right    2012 positive  . BREAST EXCISIONAL BIOPSY Right    2012 positive  . BREAST SURGERY    . CARDIAC CATHETERIZATION  2014  . CORONARY ARTERY BYPASS GRAFT  09/05/2012   Procedure: CORONARY ARTERY BYPASS GRAFTING (CABG);  Surgeon: Rexene Alberts, MD;  Location: [place];  Service: Open Heart Surgery;  Laterality: N/A;  CABG x four, using left internal mammary artery and bilateral greater saphenous vein harvested endoscopically  . INTRAOPERATIVE TRANSESOPHAGEAL ECHOCARDIOGRAM  09/05/2012   Procedure: INTRAOPERATIVE TRANSESOPHAGEAL ECHOCARDIOGRAM;  Surgeon: Rexene Alberts, MD;  Location: [place];  Service: Open Heart Surgery;  Laterality: N/A;  . LAPAROSCOPIC NISSEN FUNDOPLICATION    . OVARIAN CYST REMOVAL      Current Outpatient  Prescriptions  Medication Sig Dispense Refill  . ALPRAZolam (XANAX) 0.5 MG tablet Take 0.5 mg by mouth 2 (two) times daily as needed for anxiety.    Marland Kitchen aspirin 81 MG chewable tablet Chew 81 mg by mouth daily.    . Calcium Carbonate-Vitamin D (CALCIUM 500 + D PO) Take 1 tablet by mouth daily.    . cyanocobalamin (,VITAMIN B-12,) 1000 MCG/ML injection Inject 1,000 mcg into the muscle every 30 (thirty) days.    Marland Kitchen docusate sodium (COLACE) 100 MG capsule Take 100 mg by mouth daily.    . furosemide (LASIX) 40 MG tablet Take 40 mg by mouth daily as needed.    . gabapentin (NEURONTIN) 100 MG capsule Take 100 mg by mouth 2 (two) times daily.    Marland Kitchen HYDROcodone-acetaminophen (NORCO/VICODIN) 5-325 MG tablet Take 1-2 tablets by mouth every 4 (four) hours as needed for moderate pain. 30 tablet 0  . levothyroxine (SYNTHROID, LEVOTHROID) 175 MCG tablet Take 175 mcg by mouth daily before breakfast.    . metoprolol (LOPRESSOR) 50 MG tablet Take 1 tablet (50 mg total) by mouth every 8 (eight) hours. (Patient taking differently: Take 50 mg by mouth 2 (two) times daily. ) 90 tablet 5   No current facility-administered medications for this visit.      Allergies:   Ciprocinonide [fluocinolone]; Darvocet [propoxyphene n-acetaminophen]; Duratuss g [guaifenesin]; Ivp dye [iodinated diagnostic agents]; Ketek  [telithromycin]; Levaquin  [levofloxacin]; Other; and Statins   Social History:  The patient  reports that she quit smoking about 28 years ago. Her smoking use included Cigarettes. She has never used smokeless tobacco. She reports that she does not drink alcohol or use drugs.   Family History:   family history includes Breast cancer in her cousin; Cancer in her father; Heart disease in her brother and mother; Hyperlipidemia in her brother; Hypertension in her brother.    Review of Systems: Review of Systems  Constitutional: Negative.   Respiratory: Negative.   Cardiovascular: Negative.   Gastrointestinal:  Negative.   Musculoskeletal: Negative.        Gait instability  Neurological: Negative.   Psychiatric/Behavioral: Negative.   All other systems reviewed and are negative.    PHYSICAL EXAM: VS:  BP (!) 154/100 (BP Location: Left Arm, Patient Position: Sitting, Cuff Size: Large)   Pulse 80  , BMI There is no height or weight on file to calculate BMI.  GEN: Well nourished, well developed, in no acute distress  HEENT: normal  Neck: no JVD, carotid bruits, or masses Cardiac: RRR; 1+ SEM RSB,  no rubs, or gallops, no significant edema Respiratory:  clear to auscultation bilaterally, normal work of breathing GI: soft, nontender, nondistended, + BS MS: no deformity or atrophy  Skin: warm and dry, no rash Neuro:  Strength and sensation are intact Psych: euthymic mood, full affect    Recent Labs: 04/27/2016: ALT 11; TSH 3.690 05/08/2016: Hemoglobin 7.4; Platelets 252 05/09/2016: BUN 102; Creatinine, Ser 3.43; Potassium 4.3; Sodium 139    Lipid Panel Lab Results  Component Value Date   CHOL 154 09/09/2012   HDL 31 (L) 09/09/2012   LDLCALC 92 09/09/2012   TRIG 157 (H) 09/09/2012      Wt Readings from Last 3 Encounters:  08/30/16 197 lb 4 oz (89.5 kg)  05/04/16 209 lb (94.8 kg)  05/09/16 205 lb 3.2 oz (93.1 kg)       ASSESSMENT AND PLAN:   Essential hypertension - Plan: EKG 12-Lead Recommended she consider starting clonidine 0.1 mg twice a day She is scheduled to meet with Dr. Doy Hutching tomorrow Suggested she discuss this with him Prescription was sent in Clonidine previously held for hypotension but not side effects We'll avoid calcium channel blockers given leg swelling and intolerance  Coronary artery disease involving coronary bypass graft of native heart with angina pectoris ([place]) - Currently with no symptoms of angina. No further workup at this time. Continue current medication regimen.  Mixed hyperlipidemia  declined cholesterol medication  S/P CABG x 4 Denies  any symptoms concerning for angina, no further testing  Acute renal failure with other specified pathological lesion in kidney [facility]) Followed with Dr. Candiss Norse  Anemia, unspecified type  Stable  Weakness  Discussed a plan for continued exercise   Total encounter time more than 25 minutes  Greater than 50% was spent in counseling and coordination of care with the patient   Disposition:   F/U  6 months   Orders Placed This Encounter  Procedures  . EKG 12-Lead     Signed, Esmond Plants, M.D., Ph.D. 02/21/2017  [place], [place]

## 2017-02-21 ENCOUNTER — Encounter: Payer: Self-pay | Admitting: Cardiovascular Disease

## 2017-02-21 ENCOUNTER — Ambulatory Visit (INDEPENDENT_AMBULATORY_CARE_PROVIDER_SITE_OTHER): Payer: Medicare Other | Admitting: Cardiovascular Disease

## 2017-02-21 VITALS — BP 154/100 | HR 80

## 2017-02-21 DIAGNOSIS — I25708 Atherosclerosis of coronary artery bypass graft(s), unspecified, with other forms of angina pectoris: Secondary | ICD-10-CM

## 2017-02-21 DIAGNOSIS — Z951 Presence of aortocoronary bypass graft: Secondary | ICD-10-CM

## 2017-02-21 DIAGNOSIS — E1159 Type 2 diabetes mellitus with other circulatory complications: Secondary | ICD-10-CM | POA: Diagnosis not present

## 2017-02-21 DIAGNOSIS — E782 Mixed hyperlipidemia: Secondary | ICD-10-CM

## 2017-02-21 DIAGNOSIS — I10 Essential (primary) hypertension: Secondary | ICD-10-CM | POA: Diagnosis not present

## 2017-02-21 MED ORDER — CLONIDINE HCL 0.1 MG PO TABS: 0.1000 mg | ORAL_TABLET | Freq: Two times a day (BID) | ORAL | 11 refills | Status: DC

## 2017-02-21 NOTE — Patient Instructions (Signed)
Medication Instructions:   Please ask Dr. Doy Hutching if he agrees with starting: Clonidine 0.1 mg twice a day   Labwork:  No new labs needed  Testing/Procedures:  No further testing at this time   I recommend watching educational videos on topics of interest to you at:       [email or web address]  Enter code: HEARTCARE    Follow-Up: It was a pleasure seeing you in the office today. Please call us if you have new issues that need to be addressed before your next appt.  [phone]  Your physician wants you to follow-up in: 6 months.  You will receive a reminder letter in the mail two months in advance. If you don't receive a letter, please call our office to schedule the follow-up appointment.  If you need a refill on your cardiac medications before your next appointment, please call your pharmacy.

## 2017-08-13 ENCOUNTER — Encounter: Payer: Self-pay | Admitting: *Deleted

## 2017-08-14 ENCOUNTER — Ambulatory Visit: Payer: Medicare Other | Admitting: Anesthesiology

## 2017-08-14 ENCOUNTER — Encounter: Payer: Self-pay | Admitting: Emergency Medicine

## 2017-08-14 ENCOUNTER — Ambulatory Visit
Admission: RE | Admit: 2017-08-14 | Discharge: 2017-08-14 | Disposition: A | Discharge status: Home / Self Care | Payer: Medicare Other | Source: Ambulatory Visit | Attending: Ophthalmology | Admitting: Ophthalmology

## 2017-08-14 ENCOUNTER — Encounter
Admission: RE | Disposition: A | Discharge status: Home / Self Care | Payer: Self-pay | Source: Ambulatory Visit | Attending: Ophthalmology

## 2017-08-14 DIAGNOSIS — Z7984 Long term (current) use of oral hypoglycemic drugs: Secondary | ICD-10-CM | POA: Diagnosis not present

## 2017-08-14 DIAGNOSIS — Z9221 Personal history of antineoplastic chemotherapy: Secondary | ICD-10-CM | POA: Insufficient documentation

## 2017-08-14 DIAGNOSIS — R51 Headache: Secondary | ICD-10-CM | POA: Diagnosis not present

## 2017-08-14 DIAGNOSIS — I739 Peripheral vascular disease, unspecified: Secondary | ICD-10-CM | POA: Diagnosis not present

## 2017-08-14 DIAGNOSIS — Z853 Personal history of malignant neoplasm of breast: Secondary | ICD-10-CM | POA: Diagnosis not present

## 2017-08-14 DIAGNOSIS — E785 Hyperlipidemia, unspecified: Secondary | ICD-10-CM | POA: Diagnosis not present

## 2017-08-14 DIAGNOSIS — Z87891 Personal history of nicotine dependence: Secondary | ICD-10-CM | POA: Insufficient documentation

## 2017-08-14 DIAGNOSIS — K219 Gastro-esophageal reflux disease without esophagitis: Secondary | ICD-10-CM | POA: Diagnosis not present

## 2017-08-14 DIAGNOSIS — I10 Essential (primary) hypertension: Secondary | ICD-10-CM | POA: Insufficient documentation

## 2017-08-14 DIAGNOSIS — E1136 Type 2 diabetes mellitus with diabetic cataract: Secondary | ICD-10-CM | POA: Diagnosis present

## 2017-08-14 DIAGNOSIS — N189 Chronic kidney disease, unspecified: Secondary | ICD-10-CM | POA: Insufficient documentation

## 2017-08-14 DIAGNOSIS — I2510 Atherosclerotic heart disease of native coronary artery without angina pectoris: Secondary | ICD-10-CM | POA: Insufficient documentation

## 2017-08-14 DIAGNOSIS — M1990 Unspecified osteoarthritis, unspecified site: Secondary | ICD-10-CM | POA: Insufficient documentation

## 2017-08-14 DIAGNOSIS — Z79899 Other long term (current) drug therapy: Secondary | ICD-10-CM | POA: Insufficient documentation

## 2017-08-14 DIAGNOSIS — Z923 Personal history of irradiation: Secondary | ICD-10-CM | POA: Diagnosis not present

## 2017-08-14 DIAGNOSIS — E039 Hypothyroidism, unspecified: Secondary | ICD-10-CM | POA: Insufficient documentation

## 2017-08-14 HISTORY — DX: Dyspnea, unspecified: R06.00

## 2017-08-14 HISTORY — PX: CATARACT EXTRACTION W/PHACO: SHX586

## 2017-08-14 HISTORY — DX: Polyneuropathy, unspecified: G62.9

## 2017-08-14 HISTORY — DX: Hypothyroidism, unspecified: E03.9

## 2017-08-14 HISTORY — DX: Chronic kidney disease, unspecified: N18.9

## 2017-08-14 LAB — GLUCOSE, CAPILLARY: Glucose-Capillary: 179 mg/dL — ABNORMAL HIGH (ref 65–99)

## 2017-08-14 SURGERY — PHACOEMULSIFICATION, CATARACT, WITH IOL INSERTION
Anesthesia: Monitor Anesthesia Care | Site: Eye | Laterality: Right | Wound class: Clean

## 2017-08-14 MED ORDER — EPINEPHRINE PF 1 MG/ML IJ SOLN
INTRAOCULAR | Status: DC | PRN
  Administered 2017-08-14: 08:00:00 via OPHTHALMIC

## 2017-08-14 MED ORDER — MOXIFLOXACIN HCL 0.5 % OP SOLN
OPHTHALMIC | Status: AC
  Filled 2017-08-14: qty 3

## 2017-08-14 MED ORDER — POVIDONE-IODINE 5 % OP SOLN
OPHTHALMIC | Status: AC
  Filled 2017-08-14: qty 30

## 2017-08-14 MED ORDER — NA CHONDROIT SULF-NA HYALURON 40-17 MG/ML IO SOLN
INTRAOCULAR | Status: AC
  Filled 2017-08-14: qty 1

## 2017-08-14 MED ORDER — ARMC OPHTHALMIC DILATING DROPS
OPHTHALMIC | Status: AC
  Administered 2017-08-14: 1 via OPHTHALMIC
  Filled 2017-08-14: qty 0.4

## 2017-08-14 MED ORDER — NA CHONDROIT SULF-NA HYALURON 40-17 MG/ML IO SOLN
INTRAOCULAR | Status: DC | PRN
  Administered 2017-08-14: 1 mL via INTRAOCULAR

## 2017-08-14 MED ORDER — ARMC OPHTHALMIC DILATING DROPS
1.0000 "application " | OPHTHALMIC | Status: AC
  Administered 2017-08-14 (×3): 1 via OPHTHALMIC

## 2017-08-14 MED ORDER — BSS IO SOLN
INTRAOCULAR | Status: DC | PRN
  Administered 2017-08-14: 4 mL via OPHTHALMIC

## 2017-08-14 MED ORDER — MIDAZOLAM HCL 2 MG/2ML IJ SOLN
INTRAMUSCULAR | Status: DC | PRN
  Administered 2017-08-14: 1 mg via INTRAVENOUS

## 2017-08-14 MED ORDER — MIDAZOLAM HCL 2 MG/2ML IJ SOLN
INTRAMUSCULAR | Status: AC
  Filled 2017-08-14: qty 2

## 2017-08-14 MED ORDER — MOXIFLOXACIN HCL 0.5 % OP SOLN
OPHTHALMIC | Status: DC | PRN
  Administered 2017-08-14: 0.2 mL via OPHTHALMIC

## 2017-08-14 MED ORDER — SODIUM CHLORIDE 0.9 % IV SOLN
INTRAVENOUS | Status: DC
  Administered 2017-08-14: 06:00:00 via INTRAVENOUS

## 2017-08-14 MED ORDER — EPINEPHRINE PF 1 MG/ML IJ SOLN
INTRAMUSCULAR | Status: AC
  Filled 2017-08-14: qty 2

## 2017-08-14 MED ORDER — CARBACHOL 0.01 % IO SOLN
INTRAOCULAR | Status: DC | PRN
  Administered 2017-08-14: 0.5 mL via INTRAOCULAR

## 2017-08-14 MED ORDER — LIDOCAINE HCL (PF) 4 % IJ SOLN
INTRAMUSCULAR | Status: AC
  Filled 2017-08-14: qty 5

## 2017-08-14 MED ORDER — TRYPAN BLUE 0.06 % OP SOLN
OPHTHALMIC | Status: AC
  Filled 2017-08-14: qty 0.5

## 2017-08-14 MED ORDER — POVIDONE-IODINE 5 % OP SOLN
OPHTHALMIC | Status: DC | PRN
  Administered 2017-08-14: 1 via OPHTHALMIC

## 2017-08-14 MED ORDER — MOXIFLOXACIN HCL 0.5 % OP SOLN: 1.0000 [drp] | OPHTHALMIC | Status: DC | PRN

## 2017-08-14 SURGICAL SUPPLY — 16 items
GLOVE BIO SURGEON STRL SZ8 (GLOVE) ×2 IMPLANT
GLOVE BIOGEL M 6.5 STRL (GLOVE) ×2 IMPLANT
GLOVE SURG LX 8.0 MICRO (GLOVE) ×1
GLOVE SURG LX STRL 8.0 MICRO (GLOVE) ×1 IMPLANT
GOWN STRL REUS W/ TWL LRG LVL3 (GOWN DISPOSABLE) ×2 IMPLANT
GOWN STRL REUS W/TWL LRG LVL3 (GOWN DISPOSABLE) ×2
LABEL CATARACT MEDS ST (LABEL) ×2 IMPLANT
LENS IOL TECNIS ITEC 22.0 (Intraocular Lens) ×2 IMPLANT
PACK CATARACT (MISCELLANEOUS) ×2 IMPLANT
PACK CATARACT BRASINGTON LX (MISCELLANEOUS) ×2 IMPLANT
PACK EYE AFTER SURG (MISCELLANEOUS) ×2 IMPLANT
SOL BSS BAG (MISCELLANEOUS) ×2
SOLUTION BSS BAG (MISCELLANEOUS) ×1 IMPLANT
SYR 5ML LL (SYRINGE) ×2 IMPLANT
WATER STERILE IRR 250ML POUR (IV SOLUTION) ×2 IMPLANT
WIPE NON LINTING 3.25X3.25 (MISCELLANEOUS) ×2 IMPLANT

## 2017-08-14 NOTE — Discharge Instructions (Signed)
Eye Surgery Discharge Instructions  Expect mild scratchy sensation or mild soreness. DO NOT RUB YOUR EYE!  The day of surgery:  Minimal physical activity, but bed rest is not required  No reading, computer work, or close hand work  No bending, lifting, or straining.  May watch TV  For 24 hours:  No driving, legal decisions, or alcoholic beverages  Safety precautions  Eat anything you prefer: It is better to start with liquids, then soup then solid foods.  _____ Eye patch should be worn until postoperative exam tomorrow.  ____ Solar shield eyeglasses should be worn for comfort in the sunlight/patch while sleeping  Resume all regular medications including aspirin or Coumadin if these were discontinued prior to surgery. You may shower, bathe, shave, or wash your hair. Tylenol may be taken for mild discomfort.  Call your doctor if you experience significant pain, nausea, or vomiting, fever > 101 or other signs of infection. [phone] or [phone] Specific instructions:  Follow-up Information    Birder Robson, MD Follow up.   Specialty:  Ophthalmology Why:  November 14 at 9:10am Contact information: [address] [place] [address] [phone]          Eye Surgery Discharge Instructions  Expect mild scratchy sensation or mild soreness. DO NOT RUB YOUR EYE!  The day of surgery:  Minimal physical activity, but bed rest is not required  No reading, computer work, or close hand work  No bending, lifting, or straining.  May watch TV  For 24 hours:  No driving, legal decisions, or alcoholic beverages  Safety precautions  Eat anything you prefer: It is better to start with liquids, then soup then solid foods.  _____ Eye patch should be worn until postoperative exam tomorrow.  ____ Solar shield eyeglasses should be worn for comfort in the sunlight/patch while sleeping  Resume all regular medications including aspirin or Coumadin if these  were discontinued prior to surgery. You may shower, bathe, shave, or wash your hair. Tylenol may be taken for mild discomfort.  Call your doctor if you experience significant pain, nausea, or vomiting, fever > 101 or other signs of infection. [phone] or [phone] Specific instructions:  Follow-up Information    Birder Robson, MD Follow up.   Specialty:  Ophthalmology Why:  November 14 at 9:10am Contact information: [address] [place] [address] [phone]

## 2017-08-14 NOTE — Anesthesia Postprocedure Evaluation (Signed)
Anesthesia Post Note  Patient: [name]  Procedure(s) Performed: CATARACT EXTRACTION PHACO AND INTRAOCULAR LENS PLACEMENT (IOC) (Right Eye)  Patient location during evaluation: Short Stay Anesthesia Type: MAC Level of consciousness: awake and alert Pain management: pain level controlled Vital Signs Assessment: post-procedure vital signs reviewed and stable Respiratory status: spontaneous breathing, nonlabored ventilation, respiratory function stable and patient connected to nasal cannula oxygen Cardiovascular status: stable and blood pressure returned to baseline Postop Assessment: no apparent nausea or vomiting Anesthetic complications: no     Last Vitals:  Vitals:   08/14/17 0605 08/14/17 0746  BP: (!) 159/79 (!) 154/73  Pulse: 86 85  Resp: 18 15  Temp: (!) 36.1 C 36.8 C  SpO2: 98% 99%    Last Pain:  Vitals:   08/14/17 0605  TempSrc: Temporal                 Brantley Fling

## 2017-08-14 NOTE — Transfer of Care (Signed)
Immediate Anesthesia Transfer of Care Note  Patient: [name]  Procedure(s) Performed: CATARACT EXTRACTION PHACO AND INTRAOCULAR LENS PLACEMENT (IOC) (Right Eye)  Patient Location: Short Stay  Anesthesia Type:MAC  Level of Consciousness: awake, alert  and oriented  Airway & Oxygen Therapy: Patient Spontanous Breathing  Post-op Assessment: Report given to RN and Post -op Vital signs reviewed and stable  Post vital signs: Reviewed  Last Vitals:  Vitals:   08/14/17 0605 08/14/17 0746  BP: (!) 159/79 (!) 154/73  Pulse: 86 85  Resp: 18 15  Temp: (!) 36.1 C 36.8 C  SpO2: 98% 99%    Last Pain:  Vitals:   08/14/17 0605  TempSrc: Temporal         Complications: No apparent anesthesia complications

## 2017-08-14 NOTE — Anesthesia Preprocedure Evaluation (Signed)
Anesthesia Evaluation  Patient identified by MRN, date of birth, ID band Patient awake    Reviewed: Allergy & Precautions, H&P , NPO status , Patient's Chart, lab work & pertinent test results, reviewed documented beta blocker date and time   History of Anesthesia Complications (+) PONV and history of anesthetic complications  Airway Mallampati: II  TM Distance: >3 FB Neck ROM: Full    Dental  (+) Edentulous Upper, Poor Dentition, Loose, Dental Advisory Given   Pulmonary shortness of breath and with exertion, neg sleep apnea, neg COPD, neg recent URI, former smoker,           Cardiovascular Exercise Tolerance: Good hypertension, Pt. on medications and Pt. on home beta blockers + angina with exertion + CAD (70% L main and 3v ASCADz, EF >60%), + CABG and + Peripheral Vascular Disease  (-) Past MI and (-) Cardiac Stents (-) dysrhythmias (-) Valvular Problems/Murmurs  Ef 60%    Neuro/Psych  Headaches, CVA negative psych ROS   GI/Hepatic Neg liver ROS, GERD  Controlled,  Endo/Other  diabetes, Well Controlled, Type 2, Oral Hypoglycemic AgentsHypothyroidism Morbid obesity  Renal/GU CRFRenal disease     Musculoskeletal   Abdominal (+) + obese,   Peds  Hematology   Anesthesia Other Findings Past Medical History: 08/26/2012: Angina pectoris (HCC) No date: Arthritis No date: Breast mass, right No date: Cancer (HCC)     Comment:  breast cancer, right side 2012 No date: Chronic kidney disease     Comment:  RENAL INSUFF No date: Complication of anesthesia 08/22/2012: Coronary artery disease     Comment:  sees Dr Gollan No date: Diabetes (HCC) No date: Dyspnea     Comment:  ON EXERTION No date: Gastritis     Comment:  hx of No date: GERD (gastroesophageal reflux disease) No date: Headache(784.0)     Comment:  migraines No date: History of breast cancer     Comment:  39 treatments of radiation. Negative chemo. No date:  History of seasonal allergies No date: Hyperlipemia No date: Hypertension     Comment:  sees Dr. Jeffrey Sparks No date: Hypothyroidism No date: Neuropathy No date: Personal history of radiation therapy No date: Pneumonia     Comment:  hx of No date: PONV (postoperative nausea and vomiting) 09/05/2012: S/P CABG x 4     Comment:  LIMA to LAD, SVG to Diag, SVG to OM1, SVG to PDA, EVH               from bilateral thighs No date: Stroke (HCC)     Comment:  2012 No date: Thyroid disease No date: Vertigo   Reproductive/Obstetrics negative OB ROS                             Anesthesia Physical  Anesthesia Plan  ASA: III  Anesthesia Plan: MAC   Post-op Pain Management:    Induction: Intravenous  PONV Risk Score and Plan: 3  Airway Management Planned: Nasal Cannula  Additional Equipment:   Intra-op Plan:   Post-operative Plan:   Informed Consent: I have reviewed the patients History and Physical, chart, labs and discussed the procedure including the risks, benefits and alternatives for the proposed anesthesia with the patient or authorized representative who has indicated his/her understanding and acceptance.   Dental advisory given  Plan Discussed with: CRNA, Anesthesiologist and Surgeon  Anesthesia Plan Comments: (Hx  Of Nissan fundoplication for GERD, EF -60%, NIDDM, breast   CA Tx. c radiation  Plan routine monitors, A line, PA cath, GETA and TEE, post op ventilation)        Anesthesia Quick Evaluation

## 2017-08-14 NOTE — H&P (Signed)
All labs reviewed. Abnormal studies sent to patients PCP when indicated.  Previous H&P reviewed, patient examined, there are NO CHANGES.  Sarah Udall LOUIS11/13/20187:11 AM

## 2017-08-14 NOTE — Anesthesia Post-op Follow-up Note (Signed)
Anesthesia QCDR form completed.        

## 2017-08-14 NOTE — Op Note (Signed)
PREOPERATIVE DIAGNOSIS:  Nuclear sclerotic cataract of the right eye.   POSTOPERATIVE DIAGNOSIS:  NUCLEAR SCLEROTIC CATARACT RIGHT EYE   OPERATIVE PROCEDURE: Procedure(s): CATARACT EXTRACTION PHACO AND INTRAOCULAR LENS PLACEMENT (IOC)   SURGEON:  Birder Robson, MD.   ANESTHESIA:  Anesthesiologist: Martha Clan, MD CRNA: Rolla Plate, CRNA  1.      Managed anesthesia care. 2.      0.35ml of Shugarcaine was instilled in the eye following the paracentesis.   COMPLICATIONS:  None.   TECHNIQUE:   Stop and chop   DESCRIPTION OF PROCEDURE:  The patient was examined and consented in the preoperative holding area where the aforementioned topical anesthesia was applied to the right eye and then brought back to the Operating Room where the right eye was prepped and draped in the usual sterile ophthalmic fashion and a lid speculum was placed. A paracentesis was created with the side port blade and the anterior chamber was filled with viscoelastic. A near clear corneal incision was performed with the steel keratome. A continuous curvilinear capsulorrhexis was performed with a cystotome followed by the capsulorrhexis forceps. Hydrodissection and hydrodelineation were carried out with BSS on a blunt cannula. The lens was removed in a stop and chop  technique and the remaining cortical material was removed with the irrigation-aspiration handpiece. The capsular bag was inflated with viscoelastic and the Technis ZCB00  lens was placed in the capsular bag without complication. The remaining viscoelastic was removed from the eye with the irrigation-aspiration handpiece. The wounds were hydrated. The anterior chamber was flushed with Miostat and the eye was inflated to physiologic pressure. 0.25ml of Vigamox was placed in the anterior chamber. The wounds were found to be water tight. The eye was dressed with Vigamox. The patient was given protective glasses to wear throughout the day and a shield with which to  sleep tonight. The patient was also given drops with which to begin a drop regimen today and will follow-up with me in one day. Implant Name Type Inv. Item Serial No. Manufacturer Lot No. LRB No. Used  LENS IOL DIOP 22.0 - [id number] Intraocular Lens LENS IOL DIOP 22.0 [id number]  Right 1   Procedure(s) with comments: CATARACT EXTRACTION PHACO AND INTRAOCULAR LENS PLACEMENT (IOC) (Right) - [place] 00:43.0 AP% 17.1 CDE 7.37 Fluid Pack lot # [id number]  Electronically signed: [place] 08/14/2017 7:44 AM

## 2017-08-15 ENCOUNTER — Encounter: Payer: Self-pay | Admitting: Ophthalmology

## 2017-08-30 ENCOUNTER — Other Ambulatory Visit: Payer: Self-pay | Admitting: Surgery

## 2017-08-30 DIAGNOSIS — Z853 Personal history of malignant neoplasm of breast: Secondary | ICD-10-CM

## 2017-10-01 ENCOUNTER — Encounter: Payer: Self-pay | Admitting: *Deleted

## 2017-10-09 ENCOUNTER — Other Ambulatory Visit: Payer: Medicare Other

## 2017-10-09 ENCOUNTER — Encounter
Admission: RE | Disposition: A | Discharge status: Home / Self Care | Payer: Self-pay | Source: Ambulatory Visit | Attending: Ophthalmology

## 2017-10-09 ENCOUNTER — Encounter: Payer: Self-pay | Admitting: Certified Registered Nurse Anesthetist

## 2017-10-09 ENCOUNTER — Ambulatory Visit
Admission: RE | Admit: 2017-10-09 | Discharge: 2017-10-09 | Disposition: A | Discharge status: Home / Self Care | Payer: Medicare Other | Source: Ambulatory Visit | Attending: Ophthalmology | Admitting: Ophthalmology

## 2017-10-09 ENCOUNTER — Ambulatory Visit: Payer: Medicare Other | Admitting: Certified Registered Nurse Anesthetist

## 2017-10-09 DIAGNOSIS — Z8673 Personal history of transient ischemic attack (TIA), and cerebral infarction without residual deficits: Secondary | ICD-10-CM | POA: Diagnosis not present

## 2017-10-09 DIAGNOSIS — Z7984 Long term (current) use of oral hypoglycemic drugs: Secondary | ICD-10-CM | POA: Diagnosis not present

## 2017-10-09 DIAGNOSIS — I129 Hypertensive chronic kidney disease with stage 1 through stage 4 chronic kidney disease, or unspecified chronic kidney disease: Secondary | ICD-10-CM | POA: Diagnosis not present

## 2017-10-09 DIAGNOSIS — N189 Chronic kidney disease, unspecified: Secondary | ICD-10-CM | POA: Insufficient documentation

## 2017-10-09 DIAGNOSIS — E039 Hypothyroidism, unspecified: Secondary | ICD-10-CM | POA: Insufficient documentation

## 2017-10-09 DIAGNOSIS — Z79890 Hormone replacement therapy (postmenopausal): Secondary | ICD-10-CM | POA: Diagnosis not present

## 2017-10-09 DIAGNOSIS — Z853 Personal history of malignant neoplasm of breast: Secondary | ICD-10-CM | POA: Insufficient documentation

## 2017-10-09 DIAGNOSIS — Z923 Personal history of irradiation: Secondary | ICD-10-CM | POA: Diagnosis not present

## 2017-10-09 DIAGNOSIS — E1151 Type 2 diabetes mellitus with diabetic peripheral angiopathy without gangrene: Secondary | ICD-10-CM | POA: Insufficient documentation

## 2017-10-09 DIAGNOSIS — Z79899 Other long term (current) drug therapy: Secondary | ICD-10-CM | POA: Diagnosis not present

## 2017-10-09 DIAGNOSIS — E1122 Type 2 diabetes mellitus with diabetic chronic kidney disease: Secondary | ICD-10-CM | POA: Diagnosis not present

## 2017-10-09 DIAGNOSIS — E1142 Type 2 diabetes mellitus with diabetic polyneuropathy: Secondary | ICD-10-CM | POA: Insufficient documentation

## 2017-10-09 DIAGNOSIS — Z6841 Body Mass Index (BMI) 40.0 and over, adult: Secondary | ICD-10-CM | POA: Diagnosis not present

## 2017-10-09 DIAGNOSIS — Z951 Presence of aortocoronary bypass graft: Secondary | ICD-10-CM | POA: Diagnosis not present

## 2017-10-09 DIAGNOSIS — H2512 Age-related nuclear cataract, left eye: Secondary | ICD-10-CM | POA: Diagnosis present

## 2017-10-09 DIAGNOSIS — Z87891 Personal history of nicotine dependence: Secondary | ICD-10-CM | POA: Insufficient documentation

## 2017-10-09 DIAGNOSIS — Z7982 Long term (current) use of aspirin: Secondary | ICD-10-CM | POA: Insufficient documentation

## 2017-10-09 DIAGNOSIS — I25119 Atherosclerotic heart disease of native coronary artery with unspecified angina pectoris: Secondary | ICD-10-CM | POA: Diagnosis not present

## 2017-10-09 HISTORY — PX: CATARACT EXTRACTION W/PHACO: SHX586

## 2017-10-09 LAB — GLUCOSE, CAPILLARY: Glucose-Capillary: 164 mg/dL — ABNORMAL HIGH (ref 65–99)

## 2017-10-09 SURGERY — PHACOEMULSIFICATION, CATARACT, WITH IOL INSERTION
Anesthesia: Monitor Anesthesia Care | Site: Eye | Laterality: Left | Wound class: Clean

## 2017-10-09 MED ORDER — POVIDONE-IODINE 5 % OP SOLN
OPHTHALMIC | Status: DC | PRN
  Administered 2017-10-09: 1 via OPHTHALMIC

## 2017-10-09 MED ORDER — EPINEPHRINE PF 1 MG/ML IJ SOLN
INTRAOCULAR | Status: DC | PRN
  Administered 2017-10-09: 12:00:00 via OPHTHALMIC

## 2017-10-09 MED ORDER — MIDAZOLAM HCL 2 MG/2ML IJ SOLN
INTRAMUSCULAR | Status: DC | PRN
  Administered 2017-10-09: .5 mg via INTRAVENOUS
  Administered 2017-10-09: 0.5 mg via INTRAVENOUS

## 2017-10-09 MED ORDER — SODIUM CHLORIDE 0.9 % IV SOLN
INTRAVENOUS | Status: DC
  Administered 2017-10-09: 10:00:00 via INTRAVENOUS

## 2017-10-09 MED ORDER — LIDOCAINE HCL (PF) 4 % IJ SOLN
INTRAMUSCULAR | Status: AC
  Filled 2017-10-09: qty 5

## 2017-10-09 MED ORDER — NA CHONDROIT SULF-NA HYALURON 40-17 MG/ML IO SOLN
INTRAOCULAR | Status: DC | PRN
  Administered 2017-10-09: 1 mL via INTRAOCULAR

## 2017-10-09 MED ORDER — LIDOCAINE HCL (PF) 4 % IJ SOLN
INTRAOCULAR | Status: DC | PRN
  Administered 2017-10-09: 4 mL via OPHTHALMIC

## 2017-10-09 MED ORDER — POVIDONE-IODINE 5 % OP SOLN
OPHTHALMIC | Status: AC
  Filled 2017-10-09: qty 30

## 2017-10-09 MED ORDER — NA CHONDROIT SULF-NA HYALURON 40-17 MG/ML IO SOLN
INTRAOCULAR | Status: AC
  Filled 2017-10-09: qty 1

## 2017-10-09 MED ORDER — MIDAZOLAM HCL 2 MG/2ML IJ SOLN
INTRAMUSCULAR | Status: AC
  Filled 2017-10-09: qty 2

## 2017-10-09 MED ORDER — ARMC OPHTHALMIC DILATING DROPS
1.0000 "application " | OPHTHALMIC | Status: AC
  Administered 2017-10-09 (×2): 1 via OPHTHALMIC

## 2017-10-09 MED ORDER — ARMC OPHTHALMIC DILATING DROPS
OPHTHALMIC | Status: AC
  Administered 2017-10-09: 1 via OPHTHALMIC
  Filled 2017-10-09: qty 0.4

## 2017-10-09 MED ORDER — EPINEPHRINE PF 1 MG/ML IJ SOLN
INTRAMUSCULAR | Status: AC
  Filled 2017-10-09: qty 2

## 2017-10-09 MED ORDER — MOXIFLOXACIN HCL 0.5 % OP SOLN
OPHTHALMIC | Status: DC | PRN
  Administered 2017-10-09: 0.2 mL via OPHTHALMIC

## 2017-10-09 MED ORDER — CARBACHOL 0.01 % IO SOLN
INTRAOCULAR | Status: DC | PRN
  Administered 2017-10-09: 0.5 mL via INTRAOCULAR

## 2017-10-09 MED ORDER — MOXIFLOXACIN HCL 0.5 % OP SOLN
1.0000 [drp] | OPHTHALMIC | Status: DC | PRN
Start: 2017-10-09 — End: 2017-10-09

## 2017-10-09 MED ORDER — MOXIFLOXACIN HCL 0.5 % OP SOLN
OPHTHALMIC | Status: AC
  Filled 2017-10-09: qty 3

## 2017-10-09 SURGICAL SUPPLY — 16 items
GLOVE BIO SURGEON STRL SZ8 (GLOVE) ×2 IMPLANT
GLOVE BIOGEL M 6.5 STRL (GLOVE) ×2 IMPLANT
GLOVE SURG LX 8.0 MICRO (GLOVE) ×1
GLOVE SURG LX STRL 8.0 MICRO (GLOVE) ×1 IMPLANT
GOWN STRL REUS W/ TWL LRG LVL3 (GOWN DISPOSABLE) ×2 IMPLANT
GOWN STRL REUS W/TWL LRG LVL3 (GOWN DISPOSABLE) ×2
LABEL CATARACT MEDS ST (LABEL) ×2 IMPLANT
LENS IOL TECNIS ITEC 22.5 (Intraocular Lens) ×2 IMPLANT
PACK CATARACT (MISCELLANEOUS) ×2 IMPLANT
PACK CATARACT BRASINGTON LX (MISCELLANEOUS) ×2 IMPLANT
PACK EYE AFTER SURG (MISCELLANEOUS) ×2 IMPLANT
SOL BSS BAG (MISCELLANEOUS) ×2
SOLUTION BSS BAG (MISCELLANEOUS) ×1 IMPLANT
SYR 5ML LL (SYRINGE) ×2 IMPLANT
WATER STERILE IRR 250ML POUR (IV SOLUTION) ×2 IMPLANT
WIPE NON LINTING 3.25X3.25 (MISCELLANEOUS) ×2 IMPLANT

## 2017-10-09 NOTE — Anesthesia Post-op Follow-up Note (Signed)
Anesthesia QCDR form completed.        

## 2017-10-09 NOTE — H&P (Signed)
All labs reviewed. Abnormal studies sent to patients PCP when indicated.  Previous H&P reviewed, patient examined, there are NO CHANGES.  [name] LOUIS1/8/201911:27 AM

## 2017-10-09 NOTE — Op Note (Signed)
PREOPERATIVE DIAGNOSIS:  Nuclear sclerotic cataract of the left eye.   POSTOPERATIVE DIAGNOSIS:  Nuclear sclerotic cataract of the left eye.   OPERATIVE PROCEDURE: Procedure(s): CATARACT EXTRACTION PHACO AND INTRAOCULAR LENS PLACEMENT (IOC)   SURGEON:  Birder Robson, MD.   ANESTHESIA:  Anesthesiologist: Martha Clan, MD CRNA: Eben Burow, CRNA; Dionne Bucy, CRNA  1.      Managed anesthesia care. 2.     0.59ml of Shugarcaine was instilled following the paracentesis   COMPLICATIONS:  None.   TECHNIQUE:   Stop and chop   DESCRIPTION OF PROCEDURE:  The patient was examined and consented in the preoperative holding area where the aforementioned topical anesthesia was applied to the left eye and then brought back to the Operating Room where the left eye was prepped and draped in the usual sterile ophthalmic fashion and a lid speculum was placed. A paracentesis was created with the side port blade and the anterior chamber was filled with viscoelastic. A near clear corneal incision was performed with the steel keratome. A continuous curvilinear capsulorrhexis was performed with a cystotome followed by the capsulorrhexis forceps. Hydrodissection and hydrodelineation were carried out with BSS on a blunt cannula. The lens was removed in a stop and chop  technique and the remaining cortical material was removed with the irrigation-aspiration handpiece. The capsular bag was inflated with viscoelastic and the Technis ZCB00 lens was placed in the capsular bag without complication. The remaining viscoelastic was removed from the eye with the irrigation-aspiration handpiece. The wounds were hydrated. The anterior chamber was flushed with Miostat and the eye was inflated to physiologic pressure. 0.30ml Vigamox was placed in the anterior chamber. The wounds were found to be water tight. The eye was dressed with Vigamox. The patient was given protective glasses to wear throughout the day and a shield with  which to sleep tonight. The patient was also given drops with which to begin a drop regimen today and will follow-up with me in one day. Implant Name Type Inv. Item Serial No. Manufacturer Lot No. LRB No. Used  LENS IOL DIOP 22.5 - [id number] Intraocular Lens LENS IOL DIOP 22.5 [id number] AMO  Left 1    Procedure(s) with comments: CATARACT EXTRACTION PHACO AND INTRAOCULAR LENS PLACEMENT (IOC) (Left) - [place] 00:30.3 AP% 11.0 CDE 3.33 Fluid Pack Lot # [id number]  Electronically signed: [place] 10/09/2017 11:52 AM

## 2017-10-09 NOTE — Anesthesia Preprocedure Evaluation (Signed)
Anesthesia Evaluation  Patient identified by MRN, date of birth, ID band Patient awake    Reviewed: Allergy & Precautions, H&P , NPO status , Patient's Chart, lab work & pertinent test results, reviewed documented beta blocker date and time   History of Anesthesia Complications (+) PONV and history of anesthetic complications  Airway Mallampati: II  TM Distance: >3 FB Neck ROM: Full    Dental  (+) Edentulous Upper, Poor Dentition, Loose, Dental Advisory Given   Pulmonary shortness of breath and with exertion, neg sleep apnea, neg COPD, neg recent URI, former smoker,           Cardiovascular Exercise Tolerance: Good hypertension, Pt. on medications and Pt. on home beta blockers + angina with exertion + CAD (70% L main and 3v ASCADz, EF >60%), + CABG and + Peripheral Vascular Disease  (-) Past MI and (-) Cardiac Stents (-) dysrhythmias (-) Valvular Problems/Murmurs  Ef 60%    Neuro/Psych  Headaches, CVA negative psych ROS   GI/Hepatic Neg liver ROS, GERD  Controlled,  Endo/Other  diabetes, Well Controlled, Type 2, Oral Hypoglycemic AgentsHypothyroidism Morbid obesity  Renal/GU CRFRenal disease     Musculoskeletal   Abdominal (+) + obese,   Peds  Hematology   Anesthesia Other Findings Past Medical History: 08/26/2012: Angina pectoris ([place]) No date: Arthritis No date: Breast mass, right No date: Cancer [facility])     Comment:  breast cancer, right side 2012 No date: Chronic kidney disease     Comment:  RENAL INSUFF No date: Complication of anesthesia 08/22/2012: Coronary artery disease     Comment:  sees Dr Rockey Situ No date: Diabetes ([place]) No date: Dyspnea     Comment:  ON EXERTION No date: Gastritis     Comment:  hx of No date: GERD (gastroesophageal reflux disease) No date: Headache(784.0)     Comment:  migraines No date: History of breast cancer     Comment:  39 treatments of radiation. Negative chemo. No date:  History of seasonal allergies No date: Hyperlipemia No date: Hypertension     Comment:  sees Dr. Fulton Reek No date: Hypothyroidism No date: Neuropathy No date: Personal history of radiation therapy No date: Pneumonia     Comment:  hx of No date: PONV (postoperative nausea and vomiting) 09/05/2012: S/P CABG x 4     Comment:  LIMA to LAD, SVG to Diag, SVG to OM1, SVG to PDA, EVH               from bilateral thighs No date: Stroke [facility])     Comment:  2012 No date: Thyroid disease No date: Vertigo   Reproductive/Obstetrics negative OB ROS                             Anesthesia Physical  Anesthesia Plan  ASA: III  Anesthesia Plan: MAC   Post-op Pain Management:    Induction: Intravenous  PONV Risk Score and Plan: 3  Airway Management Planned: Nasal Cannula  Additional Equipment:   Intra-op Plan:   Post-operative Plan:   Informed Consent: I have reviewed the patients History and Physical, chart, labs and discussed the procedure including the risks, benefits and alternatives for the proposed anesthesia with the patient or authorized representative who has indicated his/her understanding and acceptance.   Dental advisory given  Plan Discussed with: CRNA, Anesthesiologist and Surgeon  Anesthesia Plan Comments: (Hx  Of Nissan fundoplication for GERD, EF -60%, NIDDM, breast  CA Tx. c radiation  Plan routine monitors, A line, PA cath, GETA and TEE, post op ventilation)        Anesthesia Quick Evaluation

## 2017-10-09 NOTE — Discharge Instructions (Signed)
Eye Surgery Discharge Instructions  Expect mild scratchy sensation or mild soreness. DO NOT RUB YOUR EYE!  The day of surgery:  Minimal physical activity, but bed rest is not required  No reading, computer work, or close hand work  No bending, lifting, or straining.  May watch TV  For 24 hours:  No driving, legal decisions, or alcoholic beverages  Safety precautions  Eat anything you prefer: It is better to start with liquids, then soup then solid foods.  _____ Eye patch should be worn until postoperative exam tomorrow.  ____ Solar shield eyeglasses should be worn for comfort in the sunlight/patch while sleeping  Resume all regular medications including aspirin or Coumadin if these were discontinued prior to surgery. You may shower, bathe, shave, or wash your hair. Tylenol may be taken for mild discomfort.  Call your doctor if you experience significant pain, nausea, or vomiting, fever > 101 or other signs of infection. [phone] or [phone] Specific instructions:  Follow-up Information    Birder Robson, MD Follow up on 10/10/2017.   Specialty:  Ophthalmology Why:  At 10:00 am. Contact information: [place] [address] [phone]

## 2017-10-09 NOTE — Anesthesia Postprocedure Evaluation (Signed)
Anesthesia Post Note  Patient: [name]  Procedure(s) Performed: CATARACT EXTRACTION PHACO AND INTRAOCULAR LENS PLACEMENT ([place]) (Left Eye)  Patient location during evaluation: Short Stay Anesthesia Type: MAC Level of consciousness: awake and alert Pain management: pain level controlled Vital Signs Assessment: post-procedure vital signs reviewed and stable Respiratory status: spontaneous breathing and respiratory function stable Cardiovascular status: blood pressure returned to baseline Postop Assessment: no headache, no backache, no apparent nausea or vomiting and adequate PO intake Anesthetic complications: no     Last Vitals:  Vitals:   10/09/17 1009 10/09/17 1154  BP: (!) 180/96 (!) 156/93  Pulse: 91 90  Resp: 17 18  Temp: (!) 36.2 C 36.6 C  SpO2: 98% 96%    Last Pain:  Vitals:   10/09/17 1154  TempSrc: Temporal                 Eben Burow

## 2017-10-09 NOTE — Transfer of Care (Signed)
Immediate Anesthesia Transfer of Care Note  Patient: [name]  Procedure(s) Performed: CATARACT EXTRACTION PHACO AND INTRAOCULAR LENS PLACEMENT (IOC) (Left Eye)  Patient Location: PACU and Short Stay  Anesthesia Type:MAC  Level of Consciousness: awake, alert , oriented and patient cooperative  Airway & Oxygen Therapy: Patient Spontanous Breathing  Post-op Assessment: Report given to RN and Post -op Vital signs reviewed and stable  Post vital signs: Reviewed and stable  Last Vitals:  Vitals:   10/09/17 1009 10/09/17 1154  BP: (!) 180/96 (!) 156/93  Pulse: 91 90  Resp: 17 18  Temp: (!) 36.2 C 36.6 C  SpO2: 98% 96%    Last Pain:  Vitals:   10/09/17 1154  TempSrc: Temporal         Complications: No apparent anesthesia complications

## 2017-10-23 ENCOUNTER — Ambulatory Visit
Admission: RE | Admit: 2017-10-23 | Discharge: 2017-10-23 | Disposition: A | Discharge status: Home / Self Care | Payer: Medicare Other | Source: Ambulatory Visit | Attending: Surgery | Admitting: Surgery

## 2017-10-23 DIAGNOSIS — Z853 Personal history of malignant neoplasm of breast: Secondary | ICD-10-CM

## 2017-10-23 HISTORY — DX: Malignant neoplasm of unspecified site of unspecified female breast: C50.919

## 2017-12-19 ENCOUNTER — Other Ambulatory Visit: Payer: Self-pay | Admitting: Internal Medicine

## 2017-12-19 DIAGNOSIS — R928 Other abnormal and inconclusive findings on diagnostic imaging of breast: Secondary | ICD-10-CM

## 2018-04-23 ENCOUNTER — Ambulatory Visit
Admission: RE | Admit: 2018-04-23 | Discharge: 2018-04-23 | Disposition: A | Discharge status: Home / Self Care | Payer: Medicare Other | Source: Ambulatory Visit | Attending: Internal Medicine | Admitting: Internal Medicine

## 2018-04-23 DIAGNOSIS — R928 Other abnormal and inconclusive findings on diagnostic imaging of breast: Secondary | ICD-10-CM | POA: Insufficient documentation

## 2018-05-09 ENCOUNTER — Encounter: Payer: Self-pay | Admitting: Emergency Medicine

## 2018-05-09 ENCOUNTER — Emergency Department
Admission: EM | Admit: 2018-05-09 | Discharge: 2018-05-09 | Disposition: A | Discharge status: Home / Self Care | Payer: Medicare Other | Attending: Emergency Medicine | Admitting: Emergency Medicine

## 2018-05-09 ENCOUNTER — Other Ambulatory Visit: Payer: Self-pay

## 2018-05-09 ENCOUNTER — Emergency Department: Payer: Medicare Other

## 2018-05-09 DIAGNOSIS — E039 Hypothyroidism, unspecified: Secondary | ICD-10-CM | POA: Insufficient documentation

## 2018-05-09 DIAGNOSIS — R110 Nausea: Secondary | ICD-10-CM | POA: Diagnosis present

## 2018-05-09 DIAGNOSIS — Z87891 Personal history of nicotine dependence: Secondary | ICD-10-CM | POA: Diagnosis not present

## 2018-05-09 DIAGNOSIS — Z951 Presence of aortocoronary bypass graft: Secondary | ICD-10-CM | POA: Diagnosis not present

## 2018-05-09 DIAGNOSIS — Z8673 Personal history of transient ischemic attack (TIA), and cerebral infarction without residual deficits: Secondary | ICD-10-CM | POA: Diagnosis not present

## 2018-05-09 DIAGNOSIS — N189 Chronic kidney disease, unspecified: Secondary | ICD-10-CM | POA: Diagnosis not present

## 2018-05-09 DIAGNOSIS — I2510 Atherosclerotic heart disease of native coronary artery without angina pectoris: Secondary | ICD-10-CM | POA: Diagnosis not present

## 2018-05-09 DIAGNOSIS — K529 Noninfective gastroenteritis and colitis, unspecified: Secondary | ICD-10-CM | POA: Diagnosis not present

## 2018-05-09 DIAGNOSIS — I129 Hypertensive chronic kidney disease with stage 1 through stage 4 chronic kidney disease, or unspecified chronic kidney disease: Secondary | ICD-10-CM | POA: Insufficient documentation

## 2018-05-09 DIAGNOSIS — Z853 Personal history of malignant neoplasm of breast: Secondary | ICD-10-CM | POA: Insufficient documentation

## 2018-05-09 DIAGNOSIS — E1122 Type 2 diabetes mellitus with diabetic chronic kidney disease: Secondary | ICD-10-CM | POA: Insufficient documentation

## 2018-05-09 LAB — COMPREHENSIVE METABOLIC PANEL
ALT: 10 U/L (ref 0–44)
AST: 17 U/L (ref 15–41)
Albumin: 3.3 g/dL — ABNORMAL LOW (ref 3.5–5.0)
Alkaline Phosphatase: 93 U/L (ref 38–126)
Anion gap: 7 (ref 5–15)
BUN: 17 mg/dL (ref 8–23)
CO2: 25 mmol/L (ref 22–32)
Calcium: 8.7 mg/dL — ABNORMAL LOW (ref 8.9–10.3)
Chloride: 103 mmol/L (ref 98–111)
Creatinine, Ser: 1.35 mg/dL — ABNORMAL HIGH (ref 0.44–1.00)
GFR calc Af Amer: 41 mL/min — ABNORMAL LOW (ref >60)
GFR calc non Af Amer: 36 mL/min — ABNORMAL LOW (ref >60)
Glucose, Bld: 176 mg/dL — ABNORMAL HIGH (ref 70–99)
Potassium: 4.3 mmol/L (ref 3.5–5.1)
Sodium: 135 mmol/L (ref 135–145)
Total Bilirubin: 0.7 mg/dL (ref 0.3–1.2)
Total Protein: 7.8 g/dL (ref 6.5–8.1)

## 2018-05-09 LAB — CBC
HCT: 42.1 % (ref 35.0–47.0)
Hemoglobin: 14 g/dL (ref 12.0–16.0)
MCH: 25.1 pg — ABNORMAL LOW (ref 26.0–34.0)
MCHC: 33.2 g/dL (ref 32.0–36.0)
MCV: 75.8 fL — ABNORMAL LOW (ref 80.0–100.0)
Platelets: 194 10*3/uL (ref 150–440)
RBC: 5.55 MIL/uL — ABNORMAL HIGH (ref 3.80–5.20)
RDW: 17.9 % — ABNORMAL HIGH (ref 11.5–14.5)
WBC: 8.3 10*3/uL (ref 3.6–11.0)

## 2018-05-09 LAB — TROPONIN I: Troponin I: <0.03 ng/mL (ref <0.03)

## 2018-05-09 LAB — LIPASE, BLOOD: Lipase: 46 U/L (ref 11–51)

## 2018-05-09 MED ORDER — ONDANSETRON HCL 4 MG/2ML IJ SOLN
4.0000 mg | Freq: Once | INTRAMUSCULAR | Status: AC
  Administered 2018-05-09: 4 mg via INTRAVENOUS

## 2018-05-09 MED ORDER — LOPERAMIDE HCL 2 MG PO CAPS
4.0000 mg | ORAL_CAPSULE | Freq: Once | ORAL | Status: AC
  Administered 2018-05-09: 4 mg via ORAL

## 2018-05-09 MED ORDER — PROMETHAZINE HCL 12.5 MG PO TABS: 12.5000 mg | ORAL_TABLET | Freq: Four times a day (QID) | ORAL | 0 refills | Status: DC | PRN

## 2018-05-09 MED ORDER — LOPERAMIDE HCL 2 MG PO CAPS
ORAL_CAPSULE | ORAL | Status: AC
  Filled 2018-05-09: qty 1

## 2018-05-09 MED ORDER — ONDANSETRON 4 MG PO TBDP: 4.0000 mg | ORAL_TABLET | Freq: Three times a day (TID) | ORAL | 0 refills | Status: DC | PRN

## 2018-05-09 MED ORDER — ONDANSETRON HCL 4 MG/2ML IJ SOLN
INTRAMUSCULAR | Status: AC
  Administered 2018-05-09: 4 mg via INTRAVENOUS
  Filled 2018-05-09: qty 2

## 2018-05-09 MED ORDER — SODIUM CHLORIDE 0.9 % IV SOLN
Freq: Once | INTRAVENOUS | Status: AC
  Administered 2018-05-09: 21:00:00 via INTRAVENOUS

## 2018-05-09 NOTE — ED Provider Notes (Signed)
[facility] Emergency Department Provider Note       Time seen: ----------------------------------------- 8:57 PM on 05/09/2018 -----------------------------------------   I have reviewed the triage vital signs and the nursing notes.  HISTORY   Chief Complaint Abdominal Pain    HPI [name] is a 84 y.o. female with a history of breast cancer, chronic kidney disease, coronary disease, diabetes, gastritis, GERD, headache, hyperlipidemia, hypertension who presents to the ED for nausea.  On arrival she began having diarrhea, she states at home she is been nauseous but cannot throw up anything.  She has had some left-sided abdominal pain.  She denies fevers, chills or other complaints.  Past Medical History:  Diagnosis Date  . Angina pectoris ([place]) 08/26/2012  . Arthritis   . Breast cancer ([place]) 2012   right breast  . Breast mass, right   . Cancer [facility])    breast cancer, right side 2012  . Chronic kidney disease    RENAL INSUFF  . Complication of anesthesia   . Coronary artery disease 08/22/2012   sees Dr Rockey Situ  . Diabetes ([place])   . Dyspnea    ON EXERTION  . Gastritis    hx of  . GERD (gastroesophageal reflux disease)   . Headache(784.0)    migraines  . History of breast cancer    39 treatments of radiation. Negative chemo.  [place] Kitchen History of seasonal allergies   . Hyperlipemia   . Hypertension    sees Dr. Fulton Reek  . Hypothyroidism   . Neuropathy   . Personal history of radiation therapy   . Pneumonia    hx of  . PONV (postoperative nausea and vomiting)   . 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 Stroke ([place])    2012  . Thyroid disease   . Vertigo     Patient Active Problem List   Diagnosis Date Noted  . Weakness 08/30/2016  . Acute renal failure (ARF) ([place]) 05/04/2016  . Cerebral embolism with cerebral infarction ([place]) 09/07/2012  . Hemiplegia,  unspecified, affecting dominant side 09/07/2012  . 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 Coronary artery disease 08/22/2012  . Hyperlipidemia 08/19/2012  . Diabetes mellitus ([place]) 08/19/2012  . Hypertension 08/19/2012  . Breast cancer ([place]) 08/19/2012  . GERD (gastroesophageal reflux disease) 08/19/2012    Past Surgical History:  Procedure Laterality Date  . ABDOMINAL HYSTERECTOMY    . APPENDECTOMY    . BACK SURGERY    . BREAST BIOPSY Right    2012 positive  . BREAST EXCISIONAL BIOPSY Right    2012 positive  . BREAST LUMPECTOMY Right 2012   F/U radiation   . BREAST SURGERY    . CARDIAC CATHETERIZATION  2014  . CATARACT EXTRACTION W/PHACO Right 08/14/2017   Procedure: CATARACT EXTRACTION PHACO AND INTRAOCULAR LENS PLACEMENT (IOC);  Surgeon: Birder Robson, MD;  Location: [facility] ORS;  Service: Ophthalmology;  Laterality: Right;  [place] 00:43.0 AP% 17.1 CDE 7.37 Fluid Pack lot # [id number]  . CATARACT EXTRACTION W/PHACO Left 10/09/2017   Procedure: CATARACT EXTRACTION PHACO AND INTRAOCULAR LENS PLACEMENT ([place]);  Surgeon: Birder Robson, MD;  Location: [facility] ORS;  Service: Ophthalmology;  Laterality: Left;  [place] 00:30.3 AP% 11.0 CDE 3.33 Fluid Pack Lot # [id number]  . CORONARY ARTERY BYPASS GRAFT  09/05/2012   Procedure: CORONARY ARTERY BYPASS GRAFTING (CABG);  Surgeon: Rexene Alberts, MD;  [facility]  [facility];  Service: Open Heart Surgery;  Laterality: N/A;  CABG x four, using left internal mammary artery and bilateral greater saphenous vein harvested endoscopically  . INTRAOPERATIVE TRANSESOPHAGEAL ECHOCARDIOGRAM  09/05/2012   Procedure: INTRAOPERATIVE TRANSESOPHAGEAL ECHOCARDIOGRAM;  Surgeon: Rexene Alberts, MD;  Location: [place];  Service: Open Heart Surgery;  Laterality: N/A;  . LAPAROSCOPIC NISSEN FUNDOPLICATION    . OVARIAN CYST REMOVAL      Allergies Clonidine; Ciprocinonide [fluocinolone]; Darvocet [propoxyphene n-acetaminophen]; Duratuss g [guaifenesin]; Ivp dye [iodinated diagnostic agents];  Ketek  [telithromycin]; Levaquin  [levofloxacin]; Other; and Statins  Social History Social History   Tobacco Use  . Smoking status: Former Smoker    Types: Cigarettes    Last attempt to quit: 11/13/1988    Years since quitting: 29.5  . Smokeless tobacco: Never Used  Substance Use Topics  . Alcohol use: No  . Drug use: No   Review of Systems Constitutional: Negative for fever. Cardiovascular: Negative for chest pain. Respiratory: Negative for shortness of breath. Gastrointestinal: Positive for abdominal pain, nausea and diarrhea Musculoskeletal: Negative for back pain. Skin: Negative for rash. Neurological: Negative for headaches, focal weakness or numbness.  All systems negative/normal/unremarkable except as stated in the HPI  ____________________________________________   PHYSICAL EXAM:  VITAL SIGNS: ED Triage Vitals  Enc Vitals Group     BP 05/09/18 2012 (!) 136/54     Pulse Rate 05/09/18 2012 (!) 102     Resp 05/09/18 2012 18     Temp 05/09/18 2012 99.1 F (37.3 C)     Temp Source 05/09/18 2012 Oral     SpO2 05/09/18 2012 95 %     Weight 05/09/18 2009 234 lb (106.1 kg)     Height 05/09/18 2009 5\' 4"  (1.626 m)     Head Circumference --      Peak Flow --      Pain Score 05/09/18 2009 8     Pain Loc --      Pain Edu? --      Excl. in [place]? --    Constitutional: Alert and oriented.  Mild distress Eyes: Conjunctivae are normal. Normal extraocular movements. ENT   Head: Normocephalic and atraumatic.   Nose: No congestion/rhinnorhea.   Mouth/Throat: Mucous membranes are moist.   Neck: No stridor. Cardiovascular: Normal rate, regular rhythm. No murmurs, rubs, or gallops. Respiratory: Normal respiratory effort without tachypnea nor retractions. Breath sounds are clear and equal bilaterally. No wheezes/rales/rhonchi. Gastrointestinal: Distended, hyperactive bowel sounds. Musculoskeletal: Nontender with normal range of motion in extremities. No lower  extremity tenderness nor edema. Neurologic:  Normal speech and language. No gross focal neurologic deficits are appreciated.  Skin:  Skin is warm, dry and intact. No rash noted. Psychiatric: Mood and affect are normal. Speech and behavior are normal.  ____________________________________________  ED COURSE:  As part of my medical decision making, I reviewed the following data within the [place] History obtained from family if available, nursing notes, old chart and ekg, as well as notes from prior ED visits. Patient presented for nausea as well as diarrhea, we will assess with labs and imaging as indicated at this time.   Procedures ____________________________________________   LABS (pertinent positives/negatives)  Labs Reviewed  COMPREHENSIVE METABOLIC PANEL - Abnormal; Notable for the following components:      Result Value   Glucose, Bld 176 (*)    Creatinine, Ser 1.35 (*)    Calcium 8.7 (*)    Albumin 3.3 (*)    GFR calc  non Af Amer 36 (*)    GFR calc Af Amer 41 (*)    All other components within normal limits  CBC - Abnormal; Notable for the following components:   RBC 5.55 (*)    MCV 75.8 (*)    MCH 25.1 (*)    RDW 17.9 (*)    All other components within normal limits  LIPASE, BLOOD  TROPONIN I  URINALYSIS, COMPLETE (UACMP) WITH MICROSCOPIC    RADIOLOGY Images were viewed by me  Abdomen 2 view IMPRESSION: Nonobstructive bowel gas pattern. Mild air-filled distention of small bowel loops might potentially represent an enteritis. ____________________________________________  DIFFERENTIAL DIAGNOSIS   Gastroenteritis, dehydration, electrolyte abnormality, occult infection  FINAL ASSESSMENT AND PLAN  Gastroenteritis   Plan: The patient had presented for nausea, abdominal pain and diarrhea. Patient's labs did not reveal any acute process. Patient's imaging revealed a likely enteritis pattern.  She was given fluids and antiemetics and is  feeling better.  She will be discharged with antiemetics and is encouraged to have close outpatient follow-up.   Laurence Aly, MD   Note: This note was generated in part or whole with voice recognition software. Voice recognition is usually quite accurate but there are transcription errors that can and very often do occur. I apologize for any typographical errors that were not detected and corrected.     Earleen Newport, MD 05/09/18 2206

## 2018-05-09 NOTE — ED Triage Notes (Signed)
Pt to triage via w/c with no distress noted; pt reports nausea since yesterday with lower abd pain; st "I think it's drainage from my nose"

## 2018-07-03 ENCOUNTER — Ambulatory Visit: Payer: Medicare Other | Admitting: Cardiovascular Disease

## 2018-07-08 ENCOUNTER — Encounter: Payer: Self-pay | Admitting: Student in an Organized Health Care Education/Training Program

## 2018-07-08 ENCOUNTER — Ambulatory Visit
Payer: Medicare Other | Attending: Student in an Organized Health Care Education/Training Program | Admitting: Student in an Organized Health Care Education/Training Program

## 2018-07-08 VITALS — BP 156/89 | HR 92 | Temp 97.9°F | Resp 16 | Ht 64.0 in | Wt 226.0 lb

## 2018-07-08 DIAGNOSIS — I69359 Hemiplegia and hemiparesis following cerebral infarction affecting unspecified side: Secondary | ICD-10-CM | POA: Diagnosis not present

## 2018-07-08 DIAGNOSIS — I129 Hypertensive chronic kidney disease with stage 1 through stage 4 chronic kidney disease, or unspecified chronic kidney disease: Secondary | ICD-10-CM | POA: Diagnosis not present

## 2018-07-08 DIAGNOSIS — Z79890 Hormone replacement therapy (postmenopausal): Secondary | ICD-10-CM | POA: Insufficient documentation

## 2018-07-08 DIAGNOSIS — Z885 Allergy status to narcotic agent status: Secondary | ICD-10-CM | POA: Diagnosis not present

## 2018-07-08 DIAGNOSIS — G8929 Other chronic pain: Secondary | ICD-10-CM | POA: Diagnosis present

## 2018-07-08 DIAGNOSIS — Z6838 Body mass index (BMI) 38.0-38.9, adult: Secondary | ICD-10-CM | POA: Diagnosis not present

## 2018-07-08 DIAGNOSIS — E1122 Type 2 diabetes mellitus with diabetic chronic kidney disease: Secondary | ICD-10-CM | POA: Diagnosis not present

## 2018-07-08 DIAGNOSIS — Z79891 Long term (current) use of opiate analgesic: Secondary | ICD-10-CM | POA: Insufficient documentation

## 2018-07-08 DIAGNOSIS — Z87891 Personal history of nicotine dependence: Secondary | ICD-10-CM | POA: Diagnosis not present

## 2018-07-08 DIAGNOSIS — Z881 Allergy status to other antibiotic agents status: Secondary | ICD-10-CM | POA: Insufficient documentation

## 2018-07-08 DIAGNOSIS — Z90710 Acquired absence of both cervix and uterus: Secondary | ICD-10-CM | POA: Insufficient documentation

## 2018-07-08 DIAGNOSIS — Z951 Presence of aortocoronary bypass graft: Secondary | ICD-10-CM | POA: Insufficient documentation

## 2018-07-08 DIAGNOSIS — M170 Bilateral primary osteoarthritis of knee: Secondary | ICD-10-CM | POA: Diagnosis not present

## 2018-07-08 DIAGNOSIS — E669 Obesity, unspecified: Secondary | ICD-10-CM | POA: Diagnosis not present

## 2018-07-08 DIAGNOSIS — Z7982 Long term (current) use of aspirin: Secondary | ICD-10-CM | POA: Diagnosis not present

## 2018-07-08 DIAGNOSIS — Z853 Personal history of malignant neoplasm of breast: Secondary | ICD-10-CM | POA: Insufficient documentation

## 2018-07-08 DIAGNOSIS — Z8249 Family history of ischemic heart disease and other diseases of the circulatory system: Secondary | ICD-10-CM | POA: Insufficient documentation

## 2018-07-08 DIAGNOSIS — M25562 Pain in left knee: Secondary | ICD-10-CM | POA: Diagnosis not present

## 2018-07-08 DIAGNOSIS — Z79899 Other long term (current) drug therapy: Secondary | ICD-10-CM | POA: Insufficient documentation

## 2018-07-08 DIAGNOSIS — N189 Chronic kidney disease, unspecified: Secondary | ICD-10-CM

## 2018-07-08 DIAGNOSIS — I2510 Atherosclerotic heart disease of native coronary artery without angina pectoris: Secondary | ICD-10-CM | POA: Insufficient documentation

## 2018-07-08 DIAGNOSIS — E1140 Type 2 diabetes mellitus with diabetic neuropathy, unspecified: Secondary | ICD-10-CM | POA: Diagnosis not present

## 2018-07-08 DIAGNOSIS — M25561 Pain in right knee: Secondary | ICD-10-CM | POA: Diagnosis not present

## 2018-07-08 DIAGNOSIS — E785 Hyperlipidemia, unspecified: Secondary | ICD-10-CM | POA: Insufficient documentation

## 2018-07-08 DIAGNOSIS — Z91041 Radiographic dye allergy status: Secondary | ICD-10-CM | POA: Insufficient documentation

## 2018-07-08 DIAGNOSIS — Z888 Allergy status to other drugs, medicaments and biological substances status: Secondary | ICD-10-CM | POA: Diagnosis not present

## 2018-07-08 DIAGNOSIS — Z803 Family history of malignant neoplasm of breast: Secondary | ICD-10-CM | POA: Insufficient documentation

## 2018-07-08 DIAGNOSIS — Z923 Personal history of irradiation: Secondary | ICD-10-CM | POA: Insufficient documentation

## 2018-07-08 DIAGNOSIS — K219 Gastro-esophageal reflux disease without esophagitis: Secondary | ICD-10-CM | POA: Insufficient documentation

## 2018-07-08 NOTE — Progress Notes (Signed)
Safety precautions to be maintained throughout the outpatient stay will include: orient to surroundings, keep bed in low position, maintain call bell within reach at all times, provide assistance with transfer out of bed and ambulation.  

## 2018-07-08 NOTE — Progress Notes (Signed)
Patient's Name: [name]  MRN: [id number]  Referring Provider: Lattie Corns, [place]*  DOB: 1934/05/16  PCP: Idelle Crouch, MD  DOS: 07/08/2018  Note by: Gillis Santa, MD  Service setting: Ambulatory outpatient  Specialty: Interventional Pain Management  Location: [facility] (AMB) Pain Management Facility  Visit type: Initial Patient Evaluation  Patient type: New Patient   Primary Reason(s) for Visit: Encounter for initial evaluation of one or more chronic problems (new to examiner) potentially causing chronic pain, and posing a threat to normal musculoskeletal function. (Level of risk: High) CC: Knee Pain (bilateral ) and Foot Pain (neuropathy )  HPI  Ms. Strawder is a 84 y.o. year old, female patient, who comes today to see [place] for the first time for an initial evaluation of her chronic pain. She has Hyperlipidemia; Diabetes mellitus ([place]); Hypertension; Breast cancer ([place]); GERD (gastroesophageal reflux disease); Coronary artery disease; S/P CABG x 4; Cerebral embolism with cerebral infarction ([employer]); Hemiplegia, unspecified, affecting dominant side; Acute renal failure (ARF) (HCC); and Weakness on their problem list. Today she comes in for evaluation of her Knee Pain (bilateral ) and Foot Pain (neuropathy )  Pain Assessment: Location: Left, Right Knee Radiating: denies  Onset: More than a month ago Duration: Chronic pain Quality: Discomfort, Constant, Throbbing Severity: (S) 10-Worst pain ever(mostly upon rising )/10 (subjective, self-reported pain score)  Note: Reported level is compatible with observation.                         When using our objective Pain Scale, levels between 6 and 10/10 are said to belong in an emergency room, as it progressively worsens from a 6/10, described as severely limiting, requiring emergency care not usually available at an outpatient pain management facility. At a 6/10 level, communication becomes difficult and requires great effort. Assistance to  reach the emergency department may be required. Facial flushing and profuse sweating along with potentially dangerous increases in heart rate and blood pressure will be evident. Effect on ADL: difficult getting up  Timing: Constant Modifying factors: cortisone injections, helped a little bit.  gel injected into both knees [facility] at [facility]  BP: (!) 156/89  HR: 92  Onset and Duration: Present longer than 3 months Cause of pain: Arthritis Severity: Getting worse and NAS-11 at its worse: 10/10 Timing: Morning, Afternoon, Night and During activity or exercise Aggravating Factors: Climbing, Prolonged sitting and Walking Alleviating Factors: Lying down Associated Problems: Temperature changes and Pain that does not allow patient to sleep Quality of Pain: Horrible and Work related Previous Examinations or Tests: X-rays Previous Treatments: The patient denies "SHOTS IN KNEE""  The patient comes into the clinics today for the first time for a chronic pain management evaluation.   Patient is a 84 year old female with a history of morbid obesity and bilateral knee pain secondary to bilateral knee osteoarthritis.  She is referred from orthopedics for consideration of bilateral genicular nerve blocks and possible Coolief radio frequency ablation.  Patient has tried intra-articular knee steroid injections as well as Synvisc viscosupplementation.  Patient states that the Synvisc injections were not very helpful and that she did obtain mild to moderate pain relief with intra-articular knee injections however they have started to become less effective.  Patient denies being on any blood thinners except 81 mg of aspirin.  She complains of pain all around her knee.  We will focus on interventional pain management, patient to continue medication management with primary care physician.  Historic Controlled Substance Pharmacotherapy Review   Historical Monitoring: The patient  reports that she does not use  drugs. List of all UDS Test(s): No results found for: MDMA, COCAINSCRNUR, [place], [place], CANNABQUANT, [place], [place] List of other Serum/Urine Drug Screening Test(s):  No results found for: AMPHSCRSER, BARBSCRSER, BENZOSCRSER, COCAINSCRSER, COCAINSCRNUR, PCPSCRSER, PCPQUANT, THCSCRSER, THCU, CANNABQUANT, OPIATESCRSER, OXYSCRSER, PROPOXSCRSER, ETH Historical Background Evaluation: [place] PMP: Six (6) year initial data search conducted.             [place] Department of public safety, offender search: Editor, commissioning Information) Non-contributory Risk Assessment Profile: Aberrant behavior: None observed or detected today Risk factors for fatal opioid overdose: concomitant use of Benzodiazepines Fatal overdose hazard ratio (HR): Calculation deferred Non-fatal overdose hazard ratio (HR): Calculation deferred Risk of opioid abuse or dependence: 0.7-3.0% with doses ? 36 MME/day and 6.1-26% with doses ? 120 MME/day. Substance use disorder (SUD) risk level: See below Personal History of Substance Abuse (SUD-Substance use disorder):  Alcohol: Negative  Illegal Drugs: Negative  Rx Drugs: Negative  ORT Risk Level calculation: Low Risk Opioid Risk Tool - 07/08/18 1221      Family History of Substance Abuse   Alcohol  Negative    Illegal Drugs  Negative    Rx Drugs  Negative      Personal History of Substance Abuse   Alcohol  Negative    Illegal Drugs  Negative    Rx Drugs  Negative      Psychological Disease   Psychological Disease  Positive    ADD  Negative    OCD  Negative    Bipolar  Negative    Schizophrenia  Negative    Depression  Positive   patient has xanax that she was given after having open heart surgery for panic attacks.      Total Score   Opioid Risk Tool Scoring  3    Opioid Risk Interpretation  Low Risk      ORT Scoring interpretation table:  Score <3 = Low Risk for SUD  Score between 4-7 = Moderate Risk for SUD  Score >8 = High Risk for Opioid Abuse   PHQ-2 Depression Scale:   Total score: 0  PHQ-2 Scoring interpretation table: (Score and probability of major depressive disorder)  Score 0 = No depression  Score 1 = 15.4% Probability  Score 2 = 21.1% Probability  Score 3 = 38.4% Probability  Score 4 = 45.5% Probability  Score 5 = 56.4% Probability  Score 6 = 78.6% Probability   PHQ-9 Depression Scale:  Total score: 0  PHQ-9 Scoring interpretation table:  Score 0-4 = No depression  Score 5-9 = Mild depression  Score 10-14 = Moderate depression  Score 15-19 = Moderately severe depression  Score 20-27 = Severe depression (2.4 times higher risk of SUD and 2.89 times higher risk of overuse)   Pharmacologic Plan: Medication management to continue with PCP.              Meds   Current Outpatient Medications:  .  ALPRAZolam (XANAX) 0.5 MG tablet, Take 0.5 mg daily as needed by mouth for anxiety. , Disp: , Rfl:  .  aspirin 81 MG chewable tablet, Chew 81 mg by mouth daily., Disp: , Rfl:  .  butalbital-acetaminophen-caffeine (ESGIC) 50-325-40 MG tablet, Take 1 tablet daily as needed by mouth for headache or migraine., Disp: , Rfl:  .  Calcium Carbonate-Vitamin D (CALCIUM 600+D PO), Take 1 tablet daily by mouth., Disp: , Rfl:  .  cyanocobalamin (,VITAMIN B-12,) 1000 MCG/ML injection, Inject 1,000 mcg into the muscle every 30 (thirty) days., Disp: , Rfl:  .  docusate sodium (COLACE) 250 MG capsule, Take 250 mg daily by mouth., Disp: , Rfl:  .  gabapentin (NEURONTIN) 100 MG capsule, Take 100 mg by mouth 2 (two) times daily., Disp: , Rfl:  .  glimepiride (AMARYL) 4 MG tablet, Take 4 mg by mouth 2 (two) times daily. If blood sugar is over 200 take 4 mgs twice daily for 30 days , Disp: , Rfl:  .  hydrALAZINE (APRESOLINE) 25 MG tablet, Take 25 mg 3 (three) times daily by mouth., Disp: , Rfl:  .  HYDROcodone-acetaminophen (NORCO/VICODIN) 5-325 MG tablet, Take 1-2 tablets by mouth every 4 (four) hours as needed for moderate pain., Disp: 30 tablet, Rfl: 0 .  levothyroxine  (SYNTHROID, LEVOTHROID) 150 MCG tablet, Take 150 mcg daily before breakfast by mouth., Disp: , Rfl:  .  metoprolol (LOPRESSOR) 50 MG tablet, Take 1 tablet (50 mg total) by mouth every 8 (eight) hours., Disp: 90 tablet, Rfl: 5 .  Multiple Vitamins-Minerals (ICAPS AREDS 2) CAPS, Take 1 capsule 2 (two) times daily by mouth., Disp: , Rfl:  .  Naphazoline HCl (CLEAR EYES OP), Apply 1 drop daily as needed to eye (dry eyes)., Disp: , Rfl:  .  ondansetron (ZOFRAN ODT) 4 MG disintegrating tablet, Take 1 tablet (4 mg total) by mouth every 8 (eight) hours as needed for nausea or vomiting., Disp: 20 tablet, Rfl: 0 .  promethazine (PHENERGAN) 12.5 MG tablet, Take 1 tablet (12.5 mg total) by mouth every 6 (six) hours as needed for nausea or vomiting., Disp: 30 tablet, Rfl: 0 .  cloNIDine (CATAPRES) 0.1 MG tablet, Take 1 tablet (0.1 mg total) by mouth 2 (two) times daily. (Patient not taking: Reported on 08/07/2017), Disp: 60 tablet, Rfl: 11 .  furosemide (LASIX) 40 MG tablet, Take 40 mg daily as needed by mouth for fluid or edema. , Disp: , Rfl:   Imaging Review   Right knee x-ray AP weightbearing of both knees, as well as lateral and merchant views of  the right knee are obtained.These films demonstrate severe degenerative  changes, primarily involving the patellofemoral compartment with 60%  Medial joint space narrowing.Almost complete patellofemoral joint space  narrowing.Overall alignment is mild valgus.No fractures, lytic  lesions, or abnormal calcifications are noted.    Complexity Note: Imaging results reviewed. Results shared with Ms. Merrihew, using Layman's terms.                         ROS  Cardiovascular: Daily Aspirin intake, High blood pressure, Heart surgery and Heart catheterization Pulmonary or Respiratory: No reported pulmonary signs or symptoms such as wheezing and difficulty taking a deep full breath (Asthma), difficulty blowing air out (Emphysema), coughing up mucus  (Bronchitis), persistent dry cough, or temporary stoppage of breathing during sleep Neurological: Stroke (Residual deficits or weakness: NOT NOTED) and Abnormal skin sensations (Peripheral Neuropathy) Review of Past Neurological Studies:  Results for orders placed or performed during the hospital encounter of 07/24/15  CT Head Wo Contrast   Narrative   CLINICAL DATA:  Slurred speech, headache, history of CVA  EXAM: CT HEAD WITHOUT CONTRAST  TECHNIQUE: Contiguous axial images were obtained from the base of the skull through the vertex without intravenous contrast.  COMPARISON:  09/08/2012  FINDINGS: No evidence of parenchymal hemorrhage or extra-axial fluid collection. No mass lesion, mass effect, or midline shift.  No CT evidence of acute infarction.  Encephalomalacic changes related to old medial left frontoparietal infarct (series 2/image 20).  Subcortical white matter and periventricular small vessel ischemic changes. Intracranial atherosclerosis.  Global cortical atrophy, frontal lobe predominant. No ventriculomegaly.  Partial opacification of the bilateral ethmoid sinuses. Mastoid air cells are clear.  No evidence of calvarial fracture.  IMPRESSION: No evidence of acute intracranial abnormality.  Old medial left frontoparietal infarct.  Atrophy with small vessel ischemic changes.   Electronically Signed   By: Julian Hy M.D.   On: 07/24/2015 16:41    Psychological-Psychiatric: No reported psychological or psychiatric signs or symptoms such as difficulty sleeping, anxiety, depression, delusions or hallucinations (schizophrenial), mood swings (bipolar disorders) or suicidal ideations or attempts Gastrointestinal: No reported gastrointestinal signs or symptoms such as vomiting or evacuating blood, reflux, heartburn, alternating episodes of diarrhea and constipation, inflamed or scarred liver, or pancreas or irrregular and/or infrequent bowel  movements Genitourinary: Kidney disease Hematological: Brusing easily and Bleeding easily Endocrine: DIABETES, THYROID DISEASE Rheumatologic: Joint aches and or swelling due to excess weight (Osteoarthritis) Musculoskeletal: Negative for myasthenia gravis, muscular dystrophy, multiple sclerosis or malignant hyperthermia Work History: Retired  Allergies  Ms. Gadbois is allergic to clonidine; ciprocinonide [fluocinolone]; darvocet [propoxyphene n-acetaminophen]; duratuss g [guaifenesin]; ivp dye [iodinated diagnostic agents]; ketek  [telithromycin]; levaquin  [levofloxacin]; other; and statins.  Laboratory Chemistry  Inflammation Markers (CRP: Acute Phase) (ESR: Chronic Phase) No results found for: CRP, ESRSEDRATE, LATICACIDVEN                       Rheumatology Markers Lab Results  Component Value Date   ANA Positive (A) 05/04/2016                        Renal Function Markers Lab Results  Component Value Date   BUN 17 05/09/2018   CREATININE 1.35 (H) 05/09/2018   BCR 15 11/22/2015   GFRAA 41 (L) 05/09/2018   GFRNONAA 36 (L) 05/09/2018                             Hepatic Function Markers Lab Results  Component Value Date   AST 17 05/09/2018   ALT 10 05/09/2018   ALBUMIN 3.3 (L) 05/09/2018   ALKPHOS 93 05/09/2018   LIPASE 46 05/09/2018                        Electrolytes Lab Results  Component Value Date   NA 135 05/09/2018   K 4.3 05/09/2018   CL 103 05/09/2018   CALCIUM 8.7 (L) 05/09/2018   MG 1.8 09/06/2012                        Neuropathy Markers Lab Results  Component Value Date   HGBA1C  04/27/2016    UNABLE TO REPORT A1C DUE TO UNKNOWN INTERFERING FACTOR CAUSING THE ANALYTICAL RANGE TO BE OUTSIDE OF ANALYZER RANGE.  SAMPLE SENT TO [facility] FOR AN ALTERNATIVE MEHTOD                        CNS Tests No results found for: COLORCSF, APPEARCSF, RBCCOUNTCSF, WBCCSF, POLYSCSF, LYMPHSCSF, EOSCSF, PROTEINCSF, GLUCCSF, JCVIRUS, CSFOLI, IGGCSF                       Bone Pathology Markers Lab Results  Component Value Date   VD25OH 20.0 (L) 04/30/2016                         Coagulation Parameters Lab Results  Component Value Date   INR 1.17 05/08/2016   LABPROT 15.0 05/08/2016   APTT 39 (H) 05/08/2016   PLT 194 05/09/2018                        Cardiovascular Markers Lab Results  Component Value Date   BNP 526.0 (H) 07/21/2015   TROPONINI <0.03 05/09/2018   HGB 14.0 05/09/2018   HCT 42.1 05/09/2018                         CA Markers Lab Results  Component Value Date   LABCA2 18.5 12/12/2011                        Note: Lab results reviewed.  [place]  Drug: Ms. Cuadrado  reports that she does not use drugs. Alcohol:  reports that she does not drink alcohol. Tobacco:  reports that she quit smoking about 29 years ago. Her smoking use included cigarettes. She has never used smokeless tobacco. Medical:  has a past medical history of Angina pectoris ([place]) (08/26/2012), Arthritis, Breast cancer ([place]) (2012), Breast mass, right, Cancer ([place]), Chronic kidney disease, Complication of anesthesia, Coronary artery disease (08/22/2012), Diabetes ([place]), Dyspnea, Gastritis, GERD (gastroesophageal reflux disease), Headache(784.0), History of breast cancer, History of seasonal allergies, Hyperlipemia, Hypertension, Hypothyroidism, Neuropathy, Personal history of radiation therapy, Pneumonia, PONV (postoperative nausea and vomiting), S/P CABG x 4 (09/05/2012), Stroke [facility]), Thyroid disease, and Vertigo. Family: family history includes Breast cancer in her cousin; Cancer in her father; Heart disease in her brother and mother; Hyperlipidemia in her brother; Hypertension in her brother.  Past Surgical History:  Procedure Laterality Date  . ABDOMINAL HYSTERECTOMY    . APPENDECTOMY    . BACK SURGERY    . BREAST BIOPSY Right    2012 positive  . BREAST EXCISIONAL BIOPSY Right    2012 positive  . BREAST LUMPECTOMY Right 2012   F/U radiation   . BREAST  SURGERY    . CARDIAC CATHETERIZATION  2014  . CATARACT EXTRACTION W/PHACO Right 08/14/2017   Procedure: CATARACT EXTRACTION PHACO AND INTRAOCULAR LENS PLACEMENT (IOC);  Surgeon: Birder Robson, MD;  Location: [facility] ORS;  Service: Ophthalmology;  Laterality: Right;  [place] 00:43.0 AP% 17.1 CDE 7.37 Fluid Pack lot # [id number]  . CATARACT EXTRACTION W/PHACO Left 10/09/2017   Procedure: CATARACT EXTRACTION PHACO AND INTRAOCULAR LENS PLACEMENT ([place]);  Surgeon: Birder Robson, MD;  Location: [facility] ORS;  Service: Ophthalmology;  Laterality: Left;  [place] 00:30.3 AP% 11.0 CDE 3.33 Fluid Pack Lot # [id number]  . CORONARY ARTERY BYPASS GRAFT  09/05/2012   Procedure: CORONARY ARTERY BYPASS GRAFTING (CABG);  Surgeon: Rexene Alberts, MD;  Location: [place];  Service: Open Heart Surgery;  Laterality: N/A;  CABG x four, using left internal mammary artery and bilateral greater saphenous vein harvested endoscopically  . INTRAOPERATIVE TRANSESOPHAGEAL ECHOCARDIOGRAM  09/05/2012   Procedure: INTRAOPERATIVE TRANSESOPHAGEAL ECHOCARDIOGRAM;  Surgeon: Rexene Alberts, MD;  Location: [place];  Service: Open Heart Surgery;  Laterality: N/A;  . LAPAROSCOPIC NISSEN FUNDOPLICATION    . OVARIAN CYST REMOVAL     Active Ambulatory Problems    Diagnosis Date Noted  . Hyperlipidemia 08/19/2012  . Diabetes mellitus (  [place]) 08/19/2012  . Hypertension 08/19/2012  . Breast cancer ([place]) 08/19/2012  . GERD (gastroesophageal reflux disease) 08/19/2012  . Coronary artery disease 08/22/2012  . 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 Cerebral embolism with cerebral infarction ([place]) 09/07/2012  . Hemiplegia, unspecified, affecting dominant side 09/07/2012  . Acute renal failure (ARF) ([place]) 05/04/2016  . Weakness 08/30/2016   Resolved Ambulatory Problems    Diagnosis Date Noted  . Throat pain 08/19/2012  . Shortness of breath 08/19/2012  . Angina pectoris ([place]) 08/26/2012  . Pneumonia 07/17/2015  . AKI (acute kidney injury) ([place]) 04/28/2016   Past  Medical History:  Diagnosis Date  . Arthritis   . Breast mass, right   . Cancer ([place])   . Chronic kidney disease   . Complication of anesthesia   . Diabetes ([place])   . Dyspnea   . Gastritis   . Headache(784.0)   . History of breast cancer   . History of seasonal allergies   . Hyperlipemia   . Hypothyroidism   . Neuropathy   . Personal history of radiation therapy   . PONV (postoperative nausea and vomiting)   . Stroke ([place])   . Thyroid disease   . Vertigo    Constitutional Exam  General appearance: Well nourished, well developed, and well hydrated. In no apparent acute distress Vitals:   07/08/18 1212  BP: (!) 156/89  Pulse: 92  Resp: 16  Temp: 97.9 F (36.6 C)  TempSrc: Oral  SpO2: 97%  Weight: 226 lb (102.5 kg)  Height: 5' 4"  (1.626 m)   BMI Assessment: Estimated body mass index is 38.79 kg/m as calculated from the following:   Height as of this encounter: 5' 4"  (1.626 m).   Weight as of this encounter: 226 lb (102.5 kg).  BMI interpretation table: BMI level Category Range association with higher incidence of chronic pain  <18 kg/m2 Underweight   18.5-24.9 kg/m2 Ideal body weight   25-29.9 kg/m2 Overweight Increased incidence by 20%  30-34.9 kg/m2 Obese (Class I) Increased incidence by 68%  35-39.9 kg/m2 Severe obesity (Class II) Increased incidence by 136%  >40 kg/m2 Extreme obesity (Class III) Increased incidence by 254%   Patient's current BMI Ideal Body weight  Body mass index is 38.79 kg/m. Ideal body weight: 54.7 kg (120 lb 9.5 oz) Adjusted ideal body weight: 73.8 kg (162 lb 12.1 oz)   BMI Readings from Last 4 Encounters:  07/08/18 38.79 kg/m  05/09/18 40.17 kg/m  10/09/17 40.17 kg/m  08/14/17 38.96 kg/m   Wt Readings from Last 4 Encounters:  07/08/18 226 lb (102.5 kg)  05/09/18 234 lb (106.1 kg)  10/09/17 234 lb (106.1 kg)  08/14/17 227 lb (103 kg)  Psych/Mental status: Alert, oriented x 3 (person, place, & time)       Eyes:  PERLA Respiratory: No evidence of acute respiratory distress  Cervical Spine Area Exam  Skin & Axial Inspection: No masses, redness, edema, swelling, or associated skin lesions Alignment: Symmetrical Functional ROM: Unrestricted ROM      Stability: No instability detected Muscle Tone/Strength: Functionally intact. No obvious neuro-muscular anomalies detected. Sensory (Neurological): Unimpaired Palpation: No palpable anomalies              Upper Extremity (UE) Exam    Side: Right upper extremity  Side: Left upper extremity  Skin & Extremity Inspection: Skin color, temperature, and hair growth are WNL. No peripheral edema or cyanosis. No masses, redness, swelling, asymmetry, or associated skin lesions. No contractures.  Skin &  Extremity Inspection: Skin color, temperature, and hair growth are WNL. No peripheral edema or cyanosis. No masses, redness, swelling, asymmetry, or associated skin lesions. No contractures.  Functional ROM: Unrestricted ROM          Functional ROM: Unrestricted ROM          Muscle Tone/Strength: Functionally intact. No obvious neuro-muscular anomalies detected.  Muscle Tone/Strength: Functionally intact. No obvious neuro-muscular anomalies detected.  Sensory (Neurological): Unimpaired          Sensory (Neurological): Unimpaired          Palpation: No palpable anomalies              Palpation: No palpable anomalies              Provocative Test(s):  Phalen's test: deferred Tinel's test: deferred Apley's scratch test (touch opposite shoulder):  Action 1 (Across chest): deferred Action 2 (Overhead): deferred Action 3 (LB reach): deferred   Provocative Test(s):  Phalen's test: deferred Tinel's test: deferred Apley's scratch test (touch opposite shoulder):  Action 1 (Across chest): deferred Action 2 (Overhead): deferred Action 3 (LB reach): deferred    Thoracic Spine Area Exam  Skin & Axial Inspection: No masses, redness, or swelling Alignment:  Symmetrical Functional ROM: Unrestricted ROM Stability: No instability detected Muscle Tone/Strength: Functionally intact. No obvious neuro-muscular anomalies detected. Sensory (Neurological): Unimpaired Muscle strength & Tone: No palpable anomalies  Lumbar Spine Area Exam  Skin & Axial Inspection: No masses, redness, or swelling Alignment: Symmetrical Functional ROM: Unrestricted ROM       Stability: No instability detected Muscle Tone/Strength: Functionally intact. No obvious neuro-muscular anomalies detected. Sensory (Neurological): Unimpaired Palpation: No palpable anomalies       Provocative Tests: Hyperextension/rotation test: deferred today       Lumbar quadrant test (Kemp's test): deferred today       Lateral bending test: deferred today       Patrick's Maneuver: deferred today                   FABER test: deferred today                   S-I anterior distraction/compression test: deferred today         S-I lateral compression test: deferred today         S-I Thigh-thrust test: deferred today         S-I Gaenslen's test: deferred today          Gait & Posture Assessment  Ambulation: Unassisted Gait: Relatively normal for age and body habitus Posture: WNL   Lower Extremity Exam    Side: Right lower extremity  Side: Left lower extremity  Stability: No instability observed          Stability: No instability observed          Skin & Extremity Inspection: Skin color, temperature, and hair growth are WNL. No peripheral edema or cyanosis. No masses, redness, swelling, asymmetry, or associated skin lesions. No contractures.  Skin & Extremity Inspection: Skin color, temperature, and hair growth are WNL. No peripheral edema or cyanosis. No masses, redness, swelling, asymmetry, or associated skin lesions. No contractures.  Functional ROM: Decreased ROM for hip and knee joints          Functional ROM: Decreased ROM for hip and knee joints          Muscle Tone/Strength: Functionally  intact. No obvious neuro-muscular anomalies detected.  Muscle Tone/Strength: Functionally intact. No obvious  neuro-muscular anomalies detected.  Sensory (Neurological): Musculoskeletal pain pattern  Sensory (Neurological): Musculoskeletal pain pattern  Palpation: Complains of area being tender to palpation  Palpation: Complains of area being tender to palpation   Assessment  Primary Diagnosis & Pertinent Problem List: The primary encounter diagnosis was Bilateral primary osteoarthritis of knee. Diagnoses of Bilateral chronic knee pain, Class 2 obesity with body mass index (BMI) of 38.0 to 38.9 in adult, unspecified obesity type, unspecified whether serious comorbidity present, S/P CABG x 4, and Chronic kidney disease, unspecified CKD stage were also pertinent to this visit.  Visit Diagnosis (New problems to examiner): 1. Bilateral primary osteoarthritis of knee   2. Bilateral chronic knee pain   3. Class 2 obesity with body mass index (BMI) of 38.0 to 38.9 in adult, unspecified obesity type, unspecified whether serious comorbidity present   4. S/P CABG x 4   5. Chronic kidney disease, unspecified CKD stage    General Recommendations: The pain condition that the patient suffers from is best treated with a multidisciplinary approach that involves an increase in physical activity to prevent de-conditioning and worsening of the pain cycle, as well as psychological counseling (formal and/or informal) to address the co-morbid psychological affects of pain. Treatment will often involve judicious use of pain medications and interventional procedures to decrease the pain, allowing the patient to participate in the physical activity that will ultimately produce long-lasting pain reductions. The goal of the multidisciplinary approach is to return the patient to a higher level of overall function and to restore their ability to perform activities of daily living.  Patient is a 84 year old female with a history  of morbid obesity and bilateral knee pain secondary to bilateral knee osteoarthritis.  She is referred from orthopedics for consideration of bilateral genicular nerve blocks and possible Coolief radio frequency ablation.  Patient has tried intra-articular knee steroid injections as well as Synvisc viscosupplementation.  Patient states that the Synvisc injections were not very helpful and that she did obtain mild to moderate pain relief with intra-articular knee injections however they have started to become less effective.  Patient denies being on any blood thinners except 81 mg of aspirin.  She complains of pain all around her knee.  We will focus on interventional pain management, patient to continue medication management with primary care physician.  Risks and benefits of bilateral genicular nerve block and possible Coolief radiofrequency ablation were discussed with patient.  All questions were answered.  Patient would like to proceed.  Plan: -Diagnostic genicular nerve block #1 under fluoroscopy with sedation for bilateral knee osteoarthritis. -Medication management to continue with PCP.   Ordered Lab-work, Procedure(s), Referral(s), & Consult(s): Orders Placed This Encounter  Procedures  . GENICULAR NERVE BLOCK   Provider-requested follow-up: Return in about 2 weeks (around 07/22/2018) for Procedure.  Future Appointments  Date Time Provider [place]  07/10/2018 10:00 AM Gillis Santa, MD [facility] None  08/15/2018 11:00 AM Rockey Situ, Kathlene November, MD [facility]    Primary Care Physician: Idelle Crouch, MD Location: [facility] Note by: Gillis Santa, M.D, Date: 07/08/2018; Time: 2:09 PM  Patient Instructions  ____________________________________________________________________________________________  General Risks and Possible Complications  Patient Responsibilities: It is important that you read this as it is part of your informed  consent. It is our duty to inform you of the risks and possible complications associated with treatments offered to you. It is your responsibility as a patient to read this and to ask questions about anything that  is not clear or that you believe was not covered in this document.  Patient's Rights: You have the right to refuse treatment. You also have the right to change your mind, even after initially having agreed to have the treatment done. However, under this last option, if you wait until the last second to change your mind, you may be charged for the materials used up to that point.  Introduction: Medicine is not an Chief Strategy Officer. Everything in Medicine, including the lack of treatment(s), carries the potential for danger, harm, or loss (which is by definition: Risk). In Medicine, a complication is a secondary problem, condition, or disease that can aggravate an already existing one. All treatments carry the risk of possible complications. The fact that a side effects or complications occurs, does not imply that the treatment was conducted incorrectly. It must be clearly understood that these can happen even when everything is done following the highest safety standards.  No treatment: You can choose not to proceed with the proposed treatment alternative. The "PRO(s)" would include: avoiding the risk of complications associated with the therapy. The "CON(s)" would include: not getting any of the treatment benefits. These benefits fall under one of three categories: diagnostic; therapeutic; and/or palliative. Diagnostic benefits include: getting information which can ultimately lead to improvement of the disease or symptom(s). Therapeutic benefits are those associated with the successful treatment of the disease. Finally, palliative benefits are those related to the decrease of the primary symptoms, without necessarily curing the condition (example: decreasing the pain from a flare-up of a chronic  condition, such as incurable terminal cancer).  General Risks and Complications: These are associated to most interventional treatments. They can occur alone, or in combination. They fall under one of the following six (6) categories: no benefit or worsening of symptoms; bleeding; infection; nerve damage; allergic reactions; and/or death. 1. No benefits or worsening of symptoms: In Medicine there are no guarantees, only probabilities. No healthcare provider can ever guarantee that a medical treatment will work, they can only state the probability that it may. Furthermore, there is always the possibility that the condition may worsen, either directly, or indirectly, as a consequence of the treatment. 2. Bleeding: This is more common if the patient is taking a blood thinner, either prescription or over the counter (example: Goody Powders, Fish oil, Aspirin, Garlic, etc.), or if suffering a condition associated with impaired coagulation (example: Hemophilia, cirrhosis of the liver, low platelet counts, etc.). However, even if you do not have one on these, it can still happen. If you have any of these conditions, or take one of these drugs, make sure to notify your treating physician. 3. Infection: This is more common in patients with a compromised immune system, either due to disease (example: diabetes, cancer, human immunodeficiency virus [HIV], etc.), or due to medications or treatments (example: therapies used to treat cancer and rheumatological diseases). However, even if you do not have one on these, it can still happen. If you have any of these conditions, or take one of these drugs, make sure to notify your treating physician. 4. Nerve Damage: This is more common when the treatment is an invasive one, but it can also happen with the use of medications, such as those used in the treatment of cancer. The damage can occur to small secondary nerves, or to large primary ones, such as those in the spinal cord  and brain. This damage may be temporary or permanent and it may lead to impairments that  can range from temporary numbness to permanent paralysis and/or brain death. 5. Allergic Reactions: Any time a substance or material comes in contact with our body, there is the possibility of an allergic reaction. These can range from a mild skin rash (contact dermatitis) to a severe systemic reaction (anaphylactic reaction), which can result in death. 6. Death: In general, any medical intervention can result in death, most of the time due to an unforeseen complication. ____________________________________________________________________________________________  ____________________________________________________________________________________________  Genicular Nerve Block  What is a genicular nerve block? A genicular nerve block is the injection of a local anesthetic to block the nerves that transmits pain from the knee.  What is the purpose of a facet nerve block? A genicular nerve block is a diagnostic procedure to determine if the pathologic changes (i.e. arthritis, meniscal tears, etc) and inflammation within the knee joint is the source of your knee pain. It also confirms that the knee pain will respond well to the actual treatment procedure. If a genicular nerve block works, it will give you relief for several hours. After that, the pain is expected to return to normal. This test is always performed twice (usually a week or two apart) because two successful tests are required to move onto treatment. If both diagnostic tests are positive, then we schedule a treatment called radiofrequency (RF) ablation. In this procedure, the same nerves are cauterized, which typically leads to pain relief for 4 -18 months. If this process works well for one knee, it can be performed on the other knee if needed.  How is the procedure performed? You will be placed on the procedure table. The injection site is sterilized  with either iodine or chlorhexadine. The site to be injected is numbed with a local anesthetic, and a needle is directed to the target area. X-ray guidance is used to ensure proper placement and positioning of the needle. When the needle is properly positioned near the genicular nerve, local anesthetic is injected to numb that nerve. This will be repeated at multiple sites around the knee to block all genicular nerves.  Will the procedure be painful? The injection can be painful and we therefore provide the option of receiving IV sedation. IV sedation, combined with local anesthetic, can make the injection nearly pain free. It allows you to remain very still during the procedure, which can also make the injection easier, faster, and more successful. If you decide to have IV sedation, you must have a driver to get you home safely afterwards. In addition, you cannot have anything to eat or drink within 8 hours of your appointment (clear liquids are allowed until 3 hours before the procedure). If you take medications for diabetes, these medications may need to be adjusted the morning of the procedure. Your primary care physician can help you with this adjustment.  What are the discharge instructions? If you received IV sedation do not drive or operate machinery for at least 24 hours after the procedure. You may return to work the next day following your procedure. You may resume your normal diet immediately. Do not engage in any strenuous activity for 24 hours. You should, however, engage in moderate activity that typically causes your ususal pain. If the block works, those activities should not be painful for several hours after the injection. Do not take a bath, swim, or use a hot tub for 24 hours (you may take a shower). Call the office if you have any of the following: severe pain afterwards (different than your  usual symptoms), redness/swelling/discharge at the injection site(s), fevers/chills, difficulty  with bowel or bladder functions.  What are the risks and side effects? The complication rate for this procedure is very low. Whenever a needle enters the skin, bleeding or infection can occur. Some other serious but extremely rare risks include paralysis and death. You may have an allergic reaction to any of the medications used. If you have a known allergy to any medications, especially local anesthetics, notify our staff before the procedure takes place. You may experience any of the following side effects up to 4 - 6 hours after the procedure: . Leg muscle weakness or numbness may occur due to the local anesthetic affecting the nerves that control your legs (this is a temporary affect and it is not paralysis). If you have any leg weakness or numbness, walk only with assistance in order to prevent falls and injury. Your leg strength will return slowly and completely. . Dizziness may occur due to a decrease in your blood pressure. If this occurs, remain in a seated or lying position. Gradually sit up, and then stand after at least 10 minutes of sitting. . Mild headaches may occur. Drink fluids and take pain medications if needed. If the headaches persist or become severe, call the office. . Mild discomfort at the injection site can occur. This typically lasts for a few hours but can persist for a couple days. If this occurs, take anti-inflammatories or pain medications, apply ice to the area the day of the procedure. If it persists, apply moist heat in the day(s) following.  The side effects listed above can be normal. They are not dangerous and will resolve on their own. If, however, you experience any of the following, a complication may have occurred and you should either contact your doctor. If he is not readily available, then you should proceed to the closest urgent care center for evaluation: . Severe or progressive pain at the injection site(s) . Arm or leg weakness that progressively worsens  or persists for longer than 8 hours . Severe or progressive redness, swelling, or discharge from the injections site(s) . Fevers, chills, nausea, or vomiting . Bowel or bladder dysfunction (i.e. inability to urinate or pass stool or difficulty controlling either)  How long does it take for the procedure to work? You should feel relief from your usual pain within the first hour. Again, this is only expected to last for several hours, at the most. Remember, you may be sore in the middle part of your back from the needles, and you must distinguish this from your usual pain. ____________________________________________________________________________________________  ____________________________________________________________________________________________  Preparing for Procedure with Sedation  Instructions: . Oral Intake: Do not eat or drink anything for at least 8 hours prior to your procedure. . Transportation: Public transportation is not allowed. Bring an adult driver. The driver must be physically present in our waiting room before any procedure can be started. [place] Physical Assistance: Bring an adult physically capable of assisting you, in the event you need help. This adult should keep you company at home for at least 6 hours after the procedure. . Blood Pressure Medicine: Take your blood pressure medicine with a sip of water the morning of the procedure. . Blood thinners: Notify our staff if you are taking any blood thinners. Depending on which one you take, there will be specific instructions on how and when to stop it. . Diabetics on insulin: Notify the staff so that you can be scheduled 1st case  in the morning. If your diabetes requires high dose insulin, take only  of your normal insulin dose the morning of the procedure and notify the staff that you have done so. . Preventing infections: Shower with an antibacterial soap the morning of your procedure. . Build-up your immune system:  Take 1000 mg of Vitamin C with every meal (3 times a day) the day prior to your procedure. [place] Kitchen Antibiotics: Inform the staff if you have a condition or reason that requires you to take antibiotics before dental procedures. . Pregnancy: If you are pregnant, call and cancel the procedure. . Sickness: If you have a cold, fever, or any active infections, call and cancel the procedure. . Arrival: You must be in the facility at least 30 minutes prior to your scheduled procedure. . Children: Do not bring children with you. . Dress appropriately: Bring dark clothing that you would not mind if they get stained. . Valuables: Do not bring any jewelry or valuables.  Procedure appointments are reserved for interventional treatments only. [place] Kitchen No Prescription Refills. . No medication changes will be discussed during procedure appointments. . No disability issues will be discussed.  Reasons to call and reschedule or cancel your procedure: (Following these recommendations will minimize the risk of a serious complication.) . Surgeries: Avoid having procedures within 2 weeks of any surgery. (Avoid for 2 weeks before or after any surgery). . Flu Shots: Avoid having procedures within 2 weeks of a flu shots or . (Avoid for 2 weeks before or after immunizations). . Barium: Avoid having a procedure within 7-10 days after having had a radiological study involving the use of radiological contrast. (Myelograms, Barium swallow or enema study). . Heart attacks: Avoid any elective procedures or surgeries for the initial 6 months after a "Myocardial Infarction" (Heart Attack). . Blood thinners: It is imperative that you stop these medications before procedures. Let us know if you if you take any blood thinner.  . Infection: Avoid procedures during or within two weeks of an infection (including chest colds or gastrointestinal problems). Symptoms associated with infections include: Localized redness, fever, chills, night sweats or  profuse sweating, burning sensation when voiding, cough, congestion, stuffiness, runny nose, sore throat, diarrhea, nausea, vomiting, cold or Flu symptoms, recent or current infections. It is specially important if the infection is over the area that we intend to treat. [place] Heart and lung problems: Symptoms that may suggest an active cardiopulmonary problem include: cough, chest pain, breathing difficulties or shortness of breath, dizziness, ankle swelling, uncontrolled high or unusually low blood pressure, and/or palpitations. If you are experiencing any of these symptoms, cancel your procedure and contact your primary care physician for an evaluation.  Remember:  Regular Business hours are:  Monday to Thursday 8:00 AM to 4:00 PM  Provider's Schedule: Milinda Pointer, MD:  Procedure days: Tuesday and Thursday 7:30 AM to 4:00 PM  Gillis Santa, MD:  Procedure days: Monday and Wednesday 7:30 AM to 4:00 PM ____________________________________________________________________________________________

## 2018-07-08 NOTE — Patient Instructions (Signed)
____________________________________________________________________________________________  General Risks and Possible Complications  Patient Responsibilities: It is important that you read this as it is part of your informed consent. It is our duty to inform you of the risks and possible complications associated with treatments offered to you. It is your responsibility as a patient to read this and to ask questions about anything that is not clear or that you believe was not covered in this document.  Patient's Rights: You have the right to refuse treatment. You also have the right to change your mind, even after initially having agreed to have the treatment done. However, under this last option, if you wait until the last second to change your mind, you may be charged for the materials used up to that point.  Introduction: Medicine is not an exact science. Everything in Medicine, including the lack of treatment(s), carries the potential for danger, harm, or loss (which is by definition: Risk). In Medicine, a complication is a secondary problem, condition, or disease that can aggravate an already existing one. All treatments carry the risk of possible complications. The fact that a side effects or complications occurs, does not imply that the treatment was conducted incorrectly. It must be clearly understood that these can happen even when everything is done following the highest safety standards.  No treatment: You can choose not to proceed with the proposed treatment alternative. The "PRO(s)" would include: avoiding the risk of complications associated with the therapy. The "CON(s)" would include: not getting any of the treatment benefits. These benefits fall under one of three categories: diagnostic; therapeutic; and/or palliative. Diagnostic benefits include: getting information which can ultimately lead to improvement of the disease or symptom(s). Therapeutic benefits are those associated with the  successful treatment of the disease. Finally, palliative benefits are those related to the decrease of the primary symptoms, without necessarily curing the condition (example: decreasing the pain from a flare-up of a chronic condition, such as incurable terminal cancer).  General Risks and Complications: These are associated to most interventional treatments. They can occur alone, or in combination. They fall under one of the following six (6) categories: no benefit or worsening of symptoms; bleeding; infection; nerve damage; allergic reactions; and/or death. 1. No benefits or worsening of symptoms: In Medicine there are no guarantees, only probabilities. No healthcare provider can ever guarantee that a medical treatment will work, they can only state the probability that it may. Furthermore, there is always the possibility that the condition may worsen, either directly, or indirectly, as a consequence of the treatment. 2. Bleeding: This is more common if the patient is taking a blood thinner, either prescription or over the counter (example: Goody Powders, Fish oil, Aspirin, Garlic, etc.), or if suffering a condition associated with impaired coagulation (example: Hemophilia, cirrhosis of the liver, low platelet counts, etc.). However, even if you do not have one on these, it can still happen. If you have any of these conditions, or take one of these drugs, make sure to notify your treating physician. 3. Infection: This is more common in patients with a compromised immune system, either due to disease (example: diabetes, cancer, human immunodeficiency virus [HIV], etc.), or due to medications or treatments (example: therapies used to treat cancer and rheumatological diseases). However, even if you do not have one on these, it can still happen. If you have any of these conditions, or take one of these drugs, make sure to notify your treating physician. 4. Nerve Damage: This is more common when the   treatment is  an invasive one, but it can also happen with the use of medications, such as those used in the treatment of cancer. The damage can occur to small secondary nerves, or to large primary ones, such as those in the spinal cord and brain. This damage may be temporary or permanent and it may lead to impairments that can range from temporary numbness to permanent paralysis and/or brain death. 5. Allergic Reactions: Any time a substance or material comes in contact with our body, there is the possibility of an allergic reaction. These can range from a mild skin rash (contact dermatitis) to a severe systemic reaction (anaphylactic reaction), which can result in death. 6. Death: In general, any medical intervention can result in death, most of the time due to an unforeseen complication. ____________________________________________________________________________________________  ____________________________________________________________________________________________  Genicular Nerve Block  What is a genicular nerve block? A genicular nerve block is the injection of a local anesthetic to block the nerves that transmits pain from the knee.  What is the purpose of a facet nerve block? A genicular nerve block is a diagnostic procedure to determine if the pathologic changes (i.e. arthritis, meniscal tears, etc) and inflammation within the knee joint is the source of your knee pain. It also confirms that the knee pain will respond well to the actual treatment procedure. If a genicular nerve block works, it will give you relief for several hours. After that, the pain is expected to return to normal. This test is always performed twice (usually a week or two apart) because two successful tests are required to move onto treatment. If both diagnostic tests are positive, then we schedule a treatment called radiofrequency (RF) ablation. In this procedure, the same nerves are cauterized, which typically leads to pain  relief for 4 -18 months. If this process works well for one knee, it can be performed on the other knee if needed.  How is the procedure performed? You will be placed on the procedure table. The injection site is sterilized with either iodine or chlorhexadine. The site to be injected is numbed with a local anesthetic, and a needle is directed to the target area. X-ray guidance is used to ensure proper placement and positioning of the needle. When the needle is properly positioned near the genicular nerve, local anesthetic is injected to numb that nerve. This will be repeated at multiple sites around the knee to block all genicular nerves.  Will the procedure be painful? The injection can be painful and we therefore provide the option of receiving IV sedation. IV sedation, combined with local anesthetic, can make the injection nearly pain free. It allows you to remain very still during the procedure, which can also make the injection easier, faster, and more successful. If you decide to have IV sedation, you must have a driver to get you home safely afterwards. In addition, you cannot have anything to eat or drink within 8 hours of your appointment (clear liquids are allowed until 3 hours before the procedure). If you take medications for diabetes, these medications may need to be adjusted the morning of the procedure. Your primary care physician can help you with this adjustment.  What are the discharge instructions? If you received IV sedation do not drive or operate machinery for at least 24 hours after the procedure. You may return to work the next day following your procedure. You may resume your normal diet immediately. Do not engage in any strenuous activity for 24 hours. You should, however,  engage in moderate activity that typically causes your ususal pain. If the block works, those activities should not be painful for several hours after the injection. Do not take a bath, swim, or use a hot tub for  24 hours (you may take a shower). Call the office if you have any of the following: severe pain afterwards (different than your usual symptoms), redness/swelling/discharge at the injection site(s), fevers/chills, difficulty with bowel or bladder functions.  What are the risks and side effects? The complication rate for this procedure is very low. Whenever a needle enters the skin, bleeding or infection can occur. Some other serious but extremely rare risks include paralysis and death. You may have an allergic reaction to any of the medications used. If you have a known allergy to any medications, especially local anesthetics, notify our staff before the procedure takes place. You may experience any of the following side effects up to 4 - 6 hours after the procedure: . Leg muscle weakness or numbness may occur due to the local anesthetic affecting the nerves that control your legs (this is a temporary affect and it is not paralysis). If you have any leg weakness or numbness, walk only with assistance in order to prevent falls and injury. Your leg strength will return slowly and completely. . Dizziness may occur due to a decrease in your blood pressure. If this occurs, remain in a seated or lying position. Gradually sit up, and then stand after at least 10 minutes of sitting. . Mild headaches may occur. Drink fluids and take pain medications if needed. If the headaches persist or become severe, call the office. . Mild discomfort at the injection site can occur. This typically lasts for a few hours but can persist for a couple days. If this occurs, take anti-inflammatories or pain medications, apply ice to the area the day of the procedure. If it persists, apply moist heat in the day(s) following.  The side effects listed above can be normal. They are not dangerous and will resolve on their own. If, however, you experience any of the following, a complication may have occurred and you should either contact  your doctor. If he is not readily available, then you should proceed to the closest urgent care center for evaluation: . Severe or progressive pain at the injection site(s) . Arm or leg weakness that progressively worsens or persists for longer than 8 hours . Severe or progressive redness, swelling, or discharge from the injections site(s) . Fevers, chills, nausea, or vomiting . Bowel or bladder dysfunction (i.e. inability to urinate or pass stool or difficulty controlling either)  How long does it take for the procedure to work? You should feel relief from your usual pain within the first hour. Again, this is only expected to last for several hours, at the most. Remember, you may be sore in the middle part of your back from the needles, and you must distinguish this from your usual pain. ____________________________________________________________________________________________  ____________________________________________________________________________________________  Preparing for Procedure with Sedation  Instructions: . Oral Intake: Do not eat or drink anything for at least 8 hours prior to your procedure. . Transportation: Public transportation is not allowed. Bring an adult driver. The driver must be physically present in our waiting room before any procedure can be started. Mar[facility] Kitchen Physical Assistance: Bring an adult physically capable of assisting you, in the event you need help. This adult should keep you company at home for at least 6 hours after the procedure. . Blood Pressure Medicine:  Take your blood pressure medicine with a sip of water the morning of the procedure. . Blood thinners: Notify our staff if you are taking any blood thinners. Depending on which one you take, there will be specific instructions on how and when to stop it. . Diabetics on insulin: Notify the staff so that you can be scheduled 1st case in the morning. If your diabetes requires high dose insulin, take only  of  your normal insulin dose the morning of the procedure and notify the staff that you have done so. . Preventing infections: Shower with an antibacterial soap the morning of your procedure. . Build-up your immune system: Take 1000 mg of Vitamin C with every meal (3 times a day) the day prior to your procedure. Marland Kitchen Antibiotics: Inform the staff if you have a condition or reason that requires you to take antibiotics before dental procedures. . Pregnancy: If you are pregnant, call and cancel the procedure. . Sickness: If you have a cold, fever, or any active infections, call and cancel the procedure. . Arrival: You must be in the facility at least 30 minutes prior to your scheduled procedure. . Children: Do not bring children with you. . Dress appropriately: Bring dark clothing that you would not mind if they get stained. . Valuables: Do not bring any jewelry or valuables.  Procedure appointments are reserved for interventional treatments only. Marland Kitchen No Prescription Refills. . No medication changes will be discussed during procedure appointments. . No disability issues will be discussed.  Reasons to call and reschedule or cancel your procedure: (Following these recommendations will minimize the risk of a serious complication.) . Surgeries: Avoid having procedures within 2 weeks of any surgery. (Avoid for 2 weeks before or after any surgery). . Flu Shots: Avoid having procedures within 2 weeks of a flu shots or . (Avoid for 2 weeks before or after immunizations). . Barium: Avoid having a procedure within 7-10 days after having had a radiological study involving the use of radiological contrast. (Myelograms, Barium swallow or enema study). . Heart attacks: Avoid any elective procedures or surgeries for the initial 6 months after a "Myocardial Infarction" (Heart Attack). . Blood thinners: It is imperative that you stop these medications before procedures. Let us know if you if you take any blood thinner.   . Infection: Avoid procedures during or within two weeks of an infection (including chest colds or gastrointestinal problems). Symptoms associated with infections include: Localized redness, fever, chills, night sweats or profuse sweating, burning sensation when voiding, cough, congestion, stuffiness, runny nose, sore throat, diarrhea, nausea, vomiting, cold or Flu symptoms, recent or current infections. It is specially important if the infection is over the area that we intend to treat. Marland Kitchen Heart and lung problems: Symptoms that may suggest an active cardiopulmonary problem include: cough, chest pain, breathing difficulties or shortness of breath, dizziness, ankle swelling, uncontrolled high or unusually low blood pressure, and/or palpitations. If you are experiencing any of these symptoms, cancel your procedure and contact your primary care physician for an evaluation.  Remember:  Regular Business hours are:  Monday to Thursday 8:00 AM to 4:00 PM  Provider's Schedule: Milinda Pointer, MD:  Procedure days: Tuesday and Thursday 7:30 AM to 4:00 PM  Gillis Santa, MD:  Procedure days: Monday and Wednesday 7:30 AM to 4:00 PM ____________________________________________________________________________________________

## 2018-07-09 ENCOUNTER — Telehealth: Payer: Self-pay | Admitting: *Deleted

## 2018-07-09 NOTE — Telephone Encounter (Signed)
Patient did not have procedure yesterday but I did go over pre-procedure instructions with her for tomorrow's visit.

## 2018-07-10 ENCOUNTER — Other Ambulatory Visit: Payer: Self-pay

## 2018-07-10 ENCOUNTER — Ambulatory Visit
Admission: RE | Admit: 2018-07-10 | Discharge: 2018-07-10 | Disposition: A | Discharge status: Home / Self Care | Payer: Medicare Other | Source: Ambulatory Visit | Attending: Student in an Organized Health Care Education/Training Program | Admitting: Student in an Organized Health Care Education/Training Program

## 2018-07-10 ENCOUNTER — Encounter: Payer: Self-pay | Admitting: Student in an Organized Health Care Education/Training Program

## 2018-07-10 ENCOUNTER — Ambulatory Visit (HOSPITAL_BASED_OUTPATIENT_CLINIC_OR_DEPARTMENT_OTHER): Payer: Medicare Other | Admitting: Student in an Organized Health Care Education/Training Program

## 2018-07-10 VITALS — BP 141/74 | HR 90 | Temp 98.1°F | Resp 22 | Ht 64.0 in | Wt 226.0 lb

## 2018-07-10 DIAGNOSIS — M170 Bilateral primary osteoarthritis of knee: Secondary | ICD-10-CM | POA: Insufficient documentation

## 2018-07-10 DIAGNOSIS — M25562 Pain in left knee: Secondary | ICD-10-CM | POA: Diagnosis not present

## 2018-07-10 DIAGNOSIS — G8929 Other chronic pain: Secondary | ICD-10-CM

## 2018-07-10 DIAGNOSIS — M25561 Pain in right knee: Secondary | ICD-10-CM

## 2018-07-10 MED ORDER — DEXAMETHASONE SODIUM PHOSPHATE 10 MG/ML IJ SOLN
10.0000 mg | Freq: Once | INTRAMUSCULAR | Status: AC
  Administered 2018-07-10: 10 mg
  Filled 2018-07-10: qty 1

## 2018-07-10 MED ORDER — LACTATED RINGERS IV SOLN: 1000.0000 mL | Freq: Once | INTRAVENOUS | Status: DC

## 2018-07-10 MED ORDER — ROPIVACAINE HCL 2 MG/ML IJ SOLN
10.0000 mL | Freq: Once | INTRAMUSCULAR | Status: DC
  Filled 2018-07-10: qty 10

## 2018-07-10 MED ORDER — LIDOCAINE HCL 2 % IJ SOLN
20.0000 mL | Freq: Once | INTRAMUSCULAR | Status: AC
  Administered 2018-07-10: 400 mg
  Filled 2018-07-10: qty 40

## 2018-07-10 MED ORDER — DEXAMETHASONE SODIUM PHOSPHATE 10 MG/ML IJ SOLN
10.0000 mg | Freq: Once | INTRAMUSCULAR | Status: AC
  Administered 2018-07-10: 20 mg
  Filled 2018-07-10: qty 1

## 2018-07-10 MED ORDER — FENTANYL CITRATE (PF) 100 MCG/2ML IJ SOLN: 25.0000 ug | INTRAMUSCULAR | Status: DC | PRN

## 2018-07-10 NOTE — Progress Notes (Signed)
Patient's Name: [name]  MRN: [id number]  Referring Provider: Gillis Santa, MD  DOB: [birth year]  PCP: Idelle Crouch, MD  DOS: 07/10/2018  Note by: Gillis Santa, MD  Service setting: Ambulatory outpatient  Specialty: Interventional Pain Management  Patient type: Established  Location: [facility] (AMB) Pain Management Facility  Visit type: Interventional Procedure   Primary Reason for Visit: Interventional Pain Management Treatment. CC: Knee Pain (bilateral)  Procedure:          Anesthesia, Analgesia, Anxiolysis:  Type: Genicular Nerves Block (Superior-lateral, Superior-medial, and Inferior-medial Genicular Nerves) #1  CPT: 25956      Primary Purpose: Diagnostic Region: Lateral, Anterior, and Medial aspects of the knee joint, above and below the knee joint proper. Level: Superior and inferior to the knee joint. Target Area: For Genicular Nerve block(s), the targets are: the superior-lateral genicular nerve, located in the lateral distal portion of the femoral shaft as it curves to form the lateral epicondyle, in the region of the distal femoral metaphysis; the superior-medial genicular nerve, located in the medial distal portion of the femoral shaft as it curves to form the medial epicondyle; and the inferior-medial genicular nerve, located in the medial, proximal portion of the tibial shaft, as it curves to form the medial epicondyle, in the region of the proximal tibial metaphysis. Approach: Anterior, percutaneous, ipsilateral approach. Laterality: Bilateral  Type: Local Anesthesia Indication(s): Analgesia         Route: Infiltration ([place]/IM) IV Access: Declined Sedation: Declined  Local Anesthetic: Lidocaine 1-2%  Position: Modified Fowler's position with pillows under the targeted knee(s).   Indications: 1. Bilateral chronic knee pain   2. Bilateral primary osteoarthritis of knee    Pain Score: Pre-procedure: 10-Worst pain ever/10 Post-procedure: 10-Worst pain  ever/10  Pre-op Assessment:  [name] is a 84 y.o. (year old), female patient, seen today for interventional treatment. She  has a past surgical history that includes Abdominal hysterectomy; Ovarian cyst removal; Back surgery; Appendectomy; Breast surgery; Laparoscopic Nissen fundoplication; Intraoprative transesophageal echocardiogram (09/05/2012); Coronary artery bypass graft (09/05/2012); Cardiac catheterization (2014); Cataract extraction w/PHACO (Right, 08/14/2017); Cataract extraction w/PHACO (Left, 10/09/2017); Breast biopsy (Right); Breast excisional biopsy (Right); and Breast lumpectomy (Right, 2012). [name] has a current medication list which includes the following prescription(s): alprazolam, aspirin, butalbital-acetaminophen-caffeine, calcium carbonate-vitamin d, cyanocobalamin, docusate sodium, gabapentin, glimepiride, hydralazine, hydrocodone-acetaminophen, levothyroxine, metoprolol tartrate, icaps areds 2, naphazoline hcl, ondansetron, promethazine, clonidine, and furosemide, and the following Facility-Administered Medications: fentanyl, lactated ringers, and ropivacaine (pf) 2 mg/ml (0.2%). Her primarily concern today is the Knee Pain (bilateral)  Initial Vital Signs:  Pulse/HCG Rate: 94  Temp: 98.1 F (36.7 C) Resp: 18 BP: (!) 145/77 SpO2: 100 %  BMI: Estimated body mass index is 38.79 kg/m as calculated from the following:   Height as of this encounter: 5\' 4"  (1.626 m).   Weight as of this encounter: 226 lb (102.5 kg).  Risk Assessment: Allergies: Reviewed. She is allergic to clonidine; ciprocinonide [fluocinolone]; darvocet [propoxyphene n-acetaminophen]; duratuss g [guaifenesin]; ivp dye [iodinated diagnostic agents]; ketek  [telithromycin]; levaquin  [levofloxacin]; other; and statins.  Allergy Precautions: None required Coagulopathies: Reviewed. None identified.  Blood-thinner therapy: None at this time Active Infection(s): Reviewed. None identified. [name]  is afebrile  Site Confirmation: [name] was asked to confirm the procedure and laterality before marking the site Procedure checklist: Completed Consent: Before the procedure and under the influence of no sedative(s), amnesic(s), or anxiolytics, the patient was informed of the treatment options, risks and possible complications. To  fulfill our ethical and legal obligations, as recommended by the American Medical Association's Code of Ethics, I have informed the patient of my clinical impression; the nature and purpose of the treatment or procedure; the risks, benefits, and possible complications of the intervention; the alternatives, including doing nothing; the risk(s) and benefit(s) of the alternative treatment(s) or procedure(s); and the risk(s) and benefit(s) of doing nothing. The patient was provided information about the general risks and possible complications associated with the procedure. These may include, but are not limited to: failure to achieve desired goals, infection, bleeding, organ or nerve damage, allergic reactions, paralysis, and death. In addition, the patient was informed of those risks and complications associated to the procedure, such as failure to decrease pain; infection; bleeding; organ or nerve damage with subsequent damage to sensory, motor, and/or autonomic systems, resulting in permanent pain, numbness, and/or weakness of one or several areas of the body; allergic reactions; (i.e.: anaphylactic reaction); and/or death. Furthermore, the patient was informed of those risks and complications associated with the medications. These include, but are not limited to: allergic reactions (i.e.: anaphylactic or anaphylactoid reaction(s)); adrenal axis suppression; blood sugar elevation that in diabetics may result in ketoacidosis or comma; water retention that in patients with history of congestive heart failure may result in shortness of breath, pulmonary edema, and decompensation  with resultant heart failure; weight gain; swelling or edema; medication-induced neural toxicity; particulate matter embolism and blood vessel occlusion with resultant organ, and/or nervous system infarction; and/or aseptic necrosis of one or more joints. Finally, the patient was informed that Medicine is not an exact science; therefore, there is also the possibility of unforeseen or unpredictable risks and/or possible complications that may result in a catastrophic outcome. The patient indicated having understood very clearly. We have given the patient no guarantees and we have made no promises. Enough time was given to the patient to ask questions, all of which were answered to the patient's satisfaction. Ms. Loya has indicated that she wanted to continue with the procedure. Attestation: I, the ordering provider, attest that I have discussed with the patient the benefits, risks, side-effects, alternatives, likelihood of achieving goals, and potential problems during recovery for the procedure that I have provided informed consent. Date  Time: 07/10/2018  9:31 AM  Pre-Procedure Preparation:  Monitoring: As per clinic protocol. Respiration, ETCO2, SpO2, BP, heart rate and rhythm monitor placed and checked for adequate function Safety Precautions: Patient was assessed for positional comfort and pressure points before starting the procedure. Time-out: I initiated and conducted the "Time-out" before starting the procedure, as per protocol. The patient was asked to participate by confirming the accuracy of the "Time Out" information. Verification of the correct person, site, and procedure were performed and confirmed by me, the nursing staff, and the patient. "Time-out" conducted as per Joint Commission's Universal Protocol (UP.01.01.01). Time: 1033  Description of Procedure:          Area Prepped: Entire knee area, from mid-thigh to mid-shin, lateral, anterior, and medial aspects. Prepping solution:  ChloraPrep (2% chlorhexidine gluconate and 70% isopropyl alcohol) Safety Precautions: Aspiration looking for blood return was conducted prior to all injections. At no point did we inject any substances, as a needle was being advanced. No attempts were made at seeking any paresthesias. Safe injection practices and needle disposal techniques used. Medications properly checked for expiration dates. SDV (single dose vial) medications used. Latex Allergy precautions taken.   Description of the Procedure: Protocol guidelines were followed. The patient was placed  in position over the procedure table. The target area was identified and the area prepped in the usual manner. Skin & deeper tissues infiltrated with local anesthetic. Appropriate amount of time allowed to pass for local anesthetics to take effect. The procedure needles were then advanced to the target area. Proper needle placement secured. Negative aspiration confirmed. Solution injected in intermittent fashion, asking for systemic symptoms every 0.5cc of injectate. The needles were then removed and the area cleansed, making sure to leave some of the prepping solution back to take advantage of its long term bactericidal properties.  Vitals:   07/10/18 1035 07/10/18 1040 07/10/18 1045 07/10/18 1055  BP: 135/67 137/74 139/76 (!) 141/74  Pulse: 90 90 89 90  Resp: (!) 24 20 (!) 23 (!) 22  Temp:      TempSrc:      SpO2: 96% 97% 97% 98%  Weight:      Height:        Start Time: 1033 hrs. End Time: 1047 hrs. Materials:  Needle(s) Type: Spinal Needle Gauge: 25G Length: 3.5-in Medication(s): Please see orders for medications and dosing details. 10 cc solution made of 8 cc of 0.2% ropivacaine, 2 cc of Decadron 10 mg/cc.  1.5 cc injected at each level x3 per knee Imaging Guidance (Non-Spinal):          Type of Imaging Technique: Fluoroscopy Guidance (Non-Spinal) Indication(s): Assistance in needle guidance and placement for procedures requiring  needle placement in or near specific anatomical locations not easily accessible without such assistance. Exposure Time: Please see nurses notes. Contrast: Before injecting any contrast, we confirmed that the patient did not have an allergy to iodine, shellfish, or radiological contrast. Once satisfactory needle placement was completed at the desired level, radiological contrast was injected. Contrast injected under live fluoroscopy. No contrast complications. See chart for type and volume of contrast used. Fluoroscopic Guidance: I was personally present during the use of fluoroscopy. "Tunnel Vision Technique" used to obtain the best possible view of the target area. Parallax error corrected before commencing the procedure. "Direction-depth-direction" technique used to introduce the needle under continuous pulsed fluoroscopy. Once target was reached, antero-posterior, oblique, and lateral fluoroscopic projection used confirm needle placement in all planes. Images permanently stored in EMR. Interpretation: I personally interpreted the imaging intraoperatively. Adequate needle placement confirmed in multiple planes. Appropriate spread of contrast into desired area was observed. No evidence of afferent or efferent intravascular uptake. Permanent images saved into the patient's record.  Antibiotic Prophylaxis:   Anti-infectives (From admission, onward)   None     Indication(s): None identified  Post-operative Assessment:  Post-procedure Vital Signs:  Pulse/HCG Rate: 90  Temp: 98.1 F (36.7 C) Resp: (!) 22 BP: (!) 141/74 SpO2: 98 %  EBL: None  Complications: No immediate post-treatment complications observed by team, or reported by patient.  Note: The patient tolerated the entire procedure well. A repeat set of vitals were taken after the procedure and the patient was kept under observation following institutional policy, for this type of procedure. Post-procedural neurological assessment was  performed, showing return to baseline, prior to discharge. The patient was provided with post-procedure discharge instructions, including a section on how to identify potential problems. Should any problems arise concerning this procedure, the patient was given instructions to immediately contact us, at any time, without hesitation. In any case, we plan to contact the patient by telephone for a follow-up status report regarding this interventional procedure.  Comments:  No additional relevant information.  Plan of Care  Imaging Orders     DG C-Arm 1-60 Min-No Report Procedure Orders    No procedure(s) ordered today    Medications ordered for procedure: Meds ordered this encounter  Medications  . lactated ringers infusion 1,000 mL  . fentaNYL (SUBLIMAZE) injection 25-100 mcg    Make sure Narcan is available in the pyxis when using this medication. In the event of respiratory depression (RR< 8/min): Titrate NARCAN (naloxone) in increments of 0.1 to 0.2 mg IV at 2-3 minute intervals, until desired degree of reversal.  . ropivacaine (PF) 2 mg/mL (0.2%) (NAROPIN) injection 10 mL  . lidocaine (XYLOCAINE) 2 % (with pres) injection 400 mg  . dexamethasone (DECADRON) injection 10 mg  . dexamethasone (DECADRON) injection 10 mg   Medications administered: We administered lidocaine, dexamethasone, and dexamethasone.  See the medical record for exact dosing, route, and time of administration.  Disposition: Discharge home  Discharge Date & Time: 07/10/2018; 1059 hrs.   Physician-requested Follow-up: Return in about 4 weeks (around 08/07/2018).  Future Appointments  Date Time Provider [place]  08/08/2018  9:45 AM Gillis Santa, MD [facility] None  08/15/2018 11:00 AM Minna Merritts, MD [id number]   Primary Care Physician: Idelle Crouch, MD Location: [facility] Note by: Gillis Santa, MD Date: 07/10/2018; Time: 11:28 AM  Disclaimer:    Medicine is not an exact science. The only guarantee in medicine is that nothing is guaranteed. It is important to note that the decision to proceed with this intervention was based on the information collected from the patient. The Data and conclusions were drawn from the patient's questionnaire, the interview, and the physical examination. Because the information was provided in large part by the patient, it cannot be guaranteed that it has not been purposely or unconsciously manipulated. Every effort has been made to obtain as much relevant data as possible for this evaluation. It is important to note that the conclusions that lead to this procedure are derived in large part from the available data. Always take into account that the treatment will also be dependent on availability of resources and existing treatment guidelines, considered by other Pain Management Practitioners as being common knowledge and practice, at the time of the intervention. For Medico-Legal purposes, it is also important to point out that variation in procedural techniques and pharmacological choices are the acceptable norm. The indications, contraindications, technique, and results of the above procedure should only be interpreted and judged by a Board-Certified Interventional Pain Specialist with extensive familiarity and expertise in the same exact procedure and technique.

## 2018-07-10 NOTE — Progress Notes (Signed)
Safety precautions to be maintained throughout the outpatient stay will include: orient to surroundings, keep bed in low position, maintain call bell within reach at all times, provide assistance with transfer out of bed and ambulation.  

## 2018-07-10 NOTE — Patient Instructions (Signed)

## 2018-07-11 ENCOUNTER — Telehealth: Payer: Self-pay | Admitting: *Deleted

## 2018-07-11 NOTE — Telephone Encounter (Signed)
Spoke with patient re; procedure on yesterday.  States the knee is doing well.  Had multiple questions as to how long block would last?  Also, states that her sugar was up and was told not to take her sugar medicine on the morning of procedure.  Patient feels that if she had taken it her BS wouldn't be high, 211. Reports that her head is hurting because of it, has taken a pain reliever for that and is drinking lots of water.   I told her that normally we ask patients to manage their sugar medicine the way they would when NPO.  Told her that next time she could go ahead and take the BS if she felt that this would be better.  Patient verbalizes u/o information and is pleased with the results of the nerve block to the knee so far.

## 2018-07-13 ENCOUNTER — Emergency Department
Admission: EM | Admit: 2018-07-13 | Discharge: 2018-07-13 | Disposition: A | Discharge status: Home / Self Care | Payer: Medicare Other | Attending: Emergency Medicine | Admitting: Emergency Medicine

## 2018-07-13 ENCOUNTER — Encounter: Payer: Self-pay | Admitting: Emergency Medicine

## 2018-07-13 ENCOUNTER — Other Ambulatory Visit: Payer: Self-pay

## 2018-07-13 DIAGNOSIS — Z87891 Personal history of nicotine dependence: Secondary | ICD-10-CM | POA: Diagnosis not present

## 2018-07-13 DIAGNOSIS — Z7982 Long term (current) use of aspirin: Secondary | ICD-10-CM | POA: Diagnosis not present

## 2018-07-13 DIAGNOSIS — H538 Other visual disturbances: Secondary | ICD-10-CM | POA: Diagnosis present

## 2018-07-13 DIAGNOSIS — I129 Hypertensive chronic kidney disease with stage 1 through stage 4 chronic kidney disease, or unspecified chronic kidney disease: Secondary | ICD-10-CM | POA: Diagnosis not present

## 2018-07-13 DIAGNOSIS — Z7984 Long term (current) use of oral hypoglycemic drugs: Secondary | ICD-10-CM | POA: Diagnosis not present

## 2018-07-13 DIAGNOSIS — E039 Hypothyroidism, unspecified: Secondary | ICD-10-CM | POA: Diagnosis not present

## 2018-07-13 DIAGNOSIS — E1122 Type 2 diabetes mellitus with diabetic chronic kidney disease: Secondary | ICD-10-CM | POA: Insufficient documentation

## 2018-07-13 DIAGNOSIS — I2510 Atherosclerotic heart disease of native coronary artery without angina pectoris: Secondary | ICD-10-CM | POA: Diagnosis not present

## 2018-07-13 DIAGNOSIS — Z951 Presence of aortocoronary bypass graft: Secondary | ICD-10-CM | POA: Insufficient documentation

## 2018-07-13 DIAGNOSIS — Z79899 Other long term (current) drug therapy: Secondary | ICD-10-CM | POA: Insufficient documentation

## 2018-07-13 DIAGNOSIS — N189 Chronic kidney disease, unspecified: Secondary | ICD-10-CM | POA: Insufficient documentation

## 2018-07-13 LAB — GLUCOSE, CAPILLARY: Glucose-Capillary: 195 mg/dL — ABNORMAL HIGH (ref 70–99)

## 2018-07-13 NOTE — ED Triage Notes (Signed)
Awoke with blurred vision this am which makes it hard for her to get around. Denies headache, smile symmetrical, grips and leg strength equal. Does state has nasal congestion and sore throat.

## 2018-07-13 NOTE — Discharge Instructions (Addendum)
Please seek medical attention for any high fevers, chest pain, shortness of breath, change in behavior, persistent vomiting, bloody stool or any other new or concerning symptoms.  

## 2018-07-13 NOTE — ED Provider Notes (Signed)
[facility] Emergency Department Provider Note  ____________________________________________   I have reviewed the triage vital signs and the nursing notes.   HISTORY  Chief Complaint Blurred Vision   History limited by: Not Limited   HPI [name] is a 84 y.o. female who presents to the emergency department today because of blurred vision.  Patient states that her blurred vision started 2 days ago.  She states that has been constant since then.  She feels like it is in both eyes.  She denies any double vision.  She denies any pain in her eyes.  No recent trauma to her head.  She states that her vision has been bad for months.  She does have reading glasses but states that her vision has not been great even with the reading glasses since earlier this year.  Per medical record review patient has a history of migraines, cataracts  Past Medical History:  Diagnosis Date  . Angina pectoris ([place]) 08/26/2012  . Arthritis   . Breast cancer ([place]) 2012   right breast  . Breast mass, right   . Cancer [facility])    breast cancer, right side 2012  . Chronic kidney disease    RENAL INSUFF  . Complication of anesthesia   . Coronary artery disease 08/22/2012   sees Dr Rockey Situ  . Diabetes ([place])   . Dyspnea    ON EXERTION  . Gastritis    hx of  . GERD (gastroesophageal reflux disease)   . Headache(784.0)    migraines  . History of breast cancer    39 treatments of radiation. Negative chemo.  Marland Kitchen History of seasonal allergies   . Hyperlipemia   . Hypertension    sees Dr. Fulton Reek  . Hypothyroidism   . Neuropathy   . Personal history of radiation therapy   . Pneumonia    hx of  . PONV (postoperative nausea and vomiting)   . 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 Stroke ([place])    2012  . Thyroid disease   . Vertigo     Patient Active Problem List   Diagnosis Date Noted  . Weakness  08/30/2016  . Acute renal failure (ARF) ([place]) 05/04/2016  . Cerebral embolism with cerebral infarction ([place]) 09/07/2012  . Hemiplegia, unspecified, affecting dominant side 09/07/2012  . 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 Coronary artery disease 08/22/2012  . Hyperlipidemia 08/19/2012  . Diabetes mellitus ([place]) 08/19/2012  . Hypertension 08/19/2012  . Breast cancer ([place]) 08/19/2012  . GERD (gastroesophageal reflux disease) 08/19/2012    Past Surgical History:  Procedure Laterality Date  . ABDOMINAL HYSTERECTOMY    . APPENDECTOMY    . BACK SURGERY    . BREAST BIOPSY Right    2012 positive  . BREAST EXCISIONAL BIOPSY Right    2012 positive  . BREAST LUMPECTOMY Right 2012   F/U radiation   . BREAST SURGERY    . CARDIAC CATHETERIZATION  2014  . CATARACT EXTRACTION W/PHACO Right 08/14/2017   Procedure: CATARACT EXTRACTION PHACO AND INTRAOCULAR LENS PLACEMENT (IOC);  Surgeon: Birder Robson, MD;  Location: [facility] ORS;  Service: Ophthalmology;  Laterality: Right;  [place] 00:43.0 AP% 17.1 CDE 7.37 Fluid Pack lot # [id number]  . CATARACT EXTRACTION W/PHACO Left 10/09/2017   Procedure: CATARACT EXTRACTION PHACO AND INTRAOCULAR LENS PLACEMENT ([place]);  Surgeon: Birder Robson, MD;  Location: [facility] ORS;  Service: Ophthalmology;  Laterality: Left;  [place] 00:30.3 AP% 11.0 CDE 3.33 Fluid Pack Lot # [id number]  . CORONARY ARTERY BYPASS GRAFT  09/05/2012   Procedure: CORONARY ARTERY BYPASS GRAFTING (CABG);  Surgeon: Rexene Alberts, MD;  Location: [place];  Service: Open Heart Surgery;  Laterality: N/A;  CABG x four, using left internal mammary artery and bilateral greater saphenous vein harvested endoscopically  . INTRAOPERATIVE TRANSESOPHAGEAL ECHOCARDIOGRAM  09/05/2012   Procedure: INTRAOPERATIVE TRANSESOPHAGEAL ECHOCARDIOGRAM;  Surgeon: Rexene Alberts, MD;  Location: [place];  Service: Open Heart Surgery;  Laterality: N/A;  . LAPAROSCOPIC NISSEN FUNDOPLICATION    . OVARIAN CYST REMOVAL      Prior to Admission  medications   Medication Sig Start Date End Date Taking? Authorizing Provider  ALPRAZolam Duanne Moron) 0.5 MG tablet Take 0.5 mg daily as needed by mouth for anxiety.     [provider]  aspirin 81 MG chewable tablet Chew 81 mg by mouth daily.    [provider]  butalbital-acetaminophen-caffeine (ESGIC) 50-325-40 MG tablet Take 1 tablet daily as needed by mouth for headache or migraine.    [provider]  Calcium Carbonate-Vitamin D (CALCIUM 600+D PO) Take 1 tablet daily by mouth.    [provider]  cloNIDine (CATAPRES) 0.1 MG tablet Take 1 tablet (0.1 mg total) by mouth 2 (two) times daily. Patient not taking: Reported on 07/10/2018 02/21/17   Minna Merritts, MD  cyanocobalamin (,VITAMIN B-12,) 1000 MCG/ML injection Inject 1,000 mcg into the muscle every 30 (thirty) days.    [provider]  docusate sodium (COLACE) 250 MG capsule Take 250 mg daily by mouth.    [provider]  furosemide (LASIX) 40 MG tablet Take 40 mg daily as needed by mouth for fluid or edema.     [provider]  gabapentin (NEURONTIN) 100 MG capsule Take 100 mg by mouth 2 (two) times daily. 09/23/12   Love, Ivan Anchors, PA-C  glimepiride (AMARYL) 4 MG tablet Take 4 mg by mouth 2 (two) times daily. If blood sugar is over 200 take 4 mgs twice daily for 30 days     [provider]  hydrALAZINE (APRESOLINE) 25 MG tablet Take 25 mg 3 (three) times daily by mouth.    [provider]  HYDROcodone-acetaminophen (NORCO/VICODIN) 5-325 MG tablet Take 1-2 tablets by mouth every 4 (four) hours as needed for moderate pain. 07/20/15   Idelle Crouch, MD  levothyroxine (SYNTHROID, LEVOTHROID) 150 MCG tablet Take 150 mcg daily before breakfast by mouth.    [provider]  metoprolol (LOPRESSOR) 50 MG tablet Take 1 tablet (50 mg total) by mouth every 8 (eight) hours. 07/20/15   Idelle Crouch, MD  Multiple Vitamins-Minerals (ICAPS AREDS 2) CAPS Take 1  capsule 2 (two) times daily by mouth.    [provider]  Naphazoline HCl (CLEAR EYES OP) Apply 1 drop daily as needed to eye (dry eyes).    [provider]  ondansetron (ZOFRAN ODT) 4 MG disintegrating tablet Take 1 tablet (4 mg total) by mouth every 8 (eight) hours as needed for nausea or vomiting. 05/09/18   Earleen Newport, MD  promethazine (PHENERGAN) 12.5 MG tablet Take 1 tablet (12.5 mg total) by mouth every 6 (six) hours as needed for nausea or vomiting. 05/09/18   Earleen Newport, MD    Allergies Clonidine; Ciprocinonide [fluocinolone]; Darvocet [propoxyphene n-acetaminophen]; Duratuss g [guaifenesin]; Ivp dye [iodinated diagnostic agents]; Ketek  [telithromycin]; Levaquin  [levofloxacin]; Other; and  Statins  Family History  Problem Relation Age of Onset  . Heart disease Brother        CABG & stents  . Hyperlipidemia Brother   . Hypertension Brother   . Cancer Father   . Heart disease Mother   . Breast cancer Cousin   . Kidney disease Neg Hx     Social History Social History   Tobacco Use  . Smoking status: Former Smoker    Types: Cigarettes    Last attempt to quit: 11/13/1988    Years since quitting: 29.6  . Smokeless tobacco: Never Used  Substance Use Topics  . Alcohol use: No  . Drug use: No    Review of Systems Constitutional: No fever/chills Eyes: Positive for blurry vision. ENT: No sore throat. Cardiovascular: Denies chest pain. Respiratory: Denies shortness of breath. Gastrointestinal: No abdominal pain.  No nausea, no vomiting.  No diarrhea.   Genitourinary: Negative for dysuria. Musculoskeletal: Negative for back pain. Skin: Negative for rash. Neurological: Negative for headaches, focal weakness or numbness.  ____________________________________________   PHYSICAL EXAM:  VITAL SIGNS: ED Triage Vitals  Enc Vitals Group     BP 07/13/18 0956 115/78     Pulse Rate 07/13/18 0956 96     Resp 07/13/18 0956 18     Temp  07/13/18 0956 (!) 97.5 F (36.4 C)     Temp Source 07/13/18 0956 Oral     SpO2 07/13/18 0956 96 %     Weight 07/13/18 0957 226 lb (102.5 kg)     Height 07/13/18 0957 5\' 4"  (1.626 m)     Head Circumference --      Peak Flow --      Pain Score 07/13/18 0957 0   Constitutional: Alert and oriented.  Eyes: Conjunctivae are normal.  ENT      Head: Normocephalic and atraumatic.      Nose: No congestion/rhinnorhea.      Mouth/Throat: Mucous membranes are moist.      Neck: No stridor. Hematological/Lymphatic/Immunilogical: No cervical lymphadenopathy. Cardiovascular: Normal rate, regular rhythm.  No murmurs, rubs, or gallops.  Respiratory: Normal respiratory effort without tachypnea nor retractions. Breath sounds are clear and equal bilaterally. No wheezes/rales/rhonchi. Gastrointestinal: Soft and non tender. No rebound. No guarding.  Genitourinary: Deferred Musculoskeletal: Normal range of motion in all extremities. No lower extremity edema. Neurologic:  Normal speech and language. No gross focal neurologic deficits are appreciated.  Skin:  Skin is warm, dry and intact. No rash noted. Psychiatric: Mood and affect are normal. Speech and behavior are normal. Patient exhibits appropriate insight and judgment.  ____________________________________________    LABS (pertinent positives/negatives)  None  ____________________________________________   EKG  None  ____________________________________________    RADIOLOGY  None  ____________________________________________   PROCEDURES  Procedures  ____________________________________________   INITIAL IMPRESSION / ASSESSMENT AND PLAN / ED COURSE  Pertinent labs & imaging results that were available during my care of the patient were reviewed by me and considered in my medical decision making (see chart for details).   Patient presented to the emergency department today because of concerns for bilateral blurry vision.   Patient does have a history of diabetes.  Patient's funduscopic and red reflex within normal limits.  Patient did have slightly worsening sugar levels over the past few days.  They have been quite volatile. Do wonder if worsening eye site is secondary to diabetes.  ______________________________________   FINAL CLINICAL IMPRESSION(S) / ED DIAGNOSES  Final diagnoses:  Blurry vision  Note: This dictation was prepared with Dragon dictation. Any transcriptional errors that result from this process are unintentional     Nance Pear, MD 07/13/18 1337

## 2018-07-30 ENCOUNTER — Ambulatory Visit: Payer: Medicare Other | Admitting: Student in an Organized Health Care Education/Training Program

## 2018-08-08 ENCOUNTER — Ambulatory Visit: Payer: Medicare Other | Admitting: Student in an Organized Health Care Education/Training Program

## 2018-08-13 NOTE — Progress Notes (Signed)
Cardiology Office Note  Date:  08/15/2018   ID:  KANDANCE YANO, DOB Jan 30, 1934, MRN [id number]  PCP:  Idelle Crouch, MD   Chief Complaint  Patient presents with  . other    6 month f/u no complaints today. Meds reviewed verbally with pt.    HPI:  [name] is an 84 yo woman with  coronary artery disease,  cardiac catheterization at [facility] 08/22/2012 showing severe distal left main, ostial LAD and circumflex disease also with RCA disease  CABG x4, LIMA to LAD, SVG to Diag, SVG to OM1, SVG to PDA, EVH from bilateral thighs postsurgical stroke with right-sided deficits Chronic renal insufficiency, hydronephrosis Anemia  who presents for followup today of her coronary artery disease, renal failure  In follow-up today she reports that she is doing relatively well Trouble with macular degeneration Knee nerve block,  Can stand up without pain  Not on lasix Not on clonidine, these were taken off her list today On metoprolol and hydralazine TID  Lab work reviewed with her in detail HBA1C 7.8 Total chol 224, she does not want a statin  Previous leg swelling on verapamil, amlodipine Reports leg swelling has not been a problem, stable No compression hose  Leg still weak but no falls  EKG personally reviewed by myself on todays visit Shows normal sinus rhythm with rate 86 bpm poor R wave progression to the anterior precordial leads, left axis deviation, no change from previous EKG 2018  Other past medical history reviewed   previously seen Dr. Lucky Cowboy, for venous insufficiency   cardiac catheterization report 08/22/2012 showed distal left main 70% disease, ostial LAD of 80%, proximal LAD 70%, distal LAD diffuse 70%, diagonal #150% followed by discrete 95% lesion, ostial circumflex 90% disease, proximal circumflex 70% disease, proximal RCA 50%, distal RCA 60% PDA branch 60%  PMH:   has a past medical history of Angina pectoris ([place]) (08/26/2012), Arthritis, Breast cancer  ([place]) (2012), Breast mass, right, Cancer ([place]), Chronic kidney disease, Complication of anesthesia, Coronary artery disease (08/22/2012), Diabetes ([place]), Dyspnea, Gastritis, GERD (gastroesophageal reflux disease), Headache(784.0), History of breast cancer, History of seasonal allergies, Hyperlipemia, Hypertension, Hypothyroidism, Macular degeneration, Neuropathy, Personal history of radiation therapy, Pneumonia, PONV (postoperative nausea and vomiting), S/P CABG x 4 (09/05/2012), Stroke [facility]), Thyroid disease, and Vertigo.  PSH:    Past Surgical History:  Procedure Laterality Date  . ABDOMINAL HYSTERECTOMY    . APPENDECTOMY    . BACK SURGERY    . BREAST BIOPSY Right    2012 positive  . BREAST EXCISIONAL BIOPSY Right    2012 positive  . BREAST LUMPECTOMY Right 2012   F/U radiation   . BREAST SURGERY    . CARDIAC CATHETERIZATION  2014  . CATARACT EXTRACTION W/PHACO Right 08/14/2017   Procedure: CATARACT EXTRACTION PHACO AND INTRAOCULAR LENS PLACEMENT (IOC);  Surgeon: Birder Robson, MD;  Location: [facility] ORS;  Service: Ophthalmology;  Laterality: Right;  [place] 00:43.0 AP% 17.1 CDE 7.37 Fluid Pack lot # [id number]  . CATARACT EXTRACTION W/PHACO Left 10/09/2017   Procedure: CATARACT EXTRACTION PHACO AND INTRAOCULAR LENS PLACEMENT ([place]);  Surgeon: Birder Robson, MD;  Location: [facility] ORS;  Service: Ophthalmology;  Laterality: Left;  [place] 00:30.3 AP% 11.0 CDE 3.33 Fluid Pack Lot # [id number]  . CORONARY ARTERY BYPASS GRAFT  09/05/2012   Procedure: CORONARY ARTERY BYPASS GRAFTING (CABG);  Surgeon: Rexene Alberts, MD;  Location: [place];  Service: Open Heart Surgery;  Laterality: N/A;  CABG x four, using left internal mammary  artery and bilateral greater saphenous vein harvested endoscopically  . INTRAOPERATIVE TRANSESOPHAGEAL ECHOCARDIOGRAM  09/05/2012   Procedure: INTRAOPERATIVE TRANSESOPHAGEAL ECHOCARDIOGRAM;  Surgeon: Rexene Alberts, MD;  Location: [place];  Service: Open Heart Surgery;  Laterality: N/A;   . KNEE SURGERY     Bilateral nerve block  . LAPAROSCOPIC NISSEN FUNDOPLICATION    . OVARIAN CYST REMOVAL      Current Outpatient Medications  Medication Sig Dispense Refill  . ALPRAZolam (XANAX) 0.5 MG tablet Take 0.5 mg daily as needed by mouth for anxiety.     Marland Kitchen aspirin 81 MG chewable tablet Chew 81 mg by mouth daily.    . butalbital-acetaminophen-caffeine (ESGIC) 50-325-40 MG tablet Take 1 tablet daily as needed by mouth for headache or migraine.    . Calcium Carbonate-Vitamin D (CALCIUM 600+D PO) Take 1 tablet daily by mouth.    . cloNIDine (CATAPRES) 0.1 MG tablet Take 1 tablet (0.1 mg total) by mouth 2 (two) times daily. 60 tablet 11  . cyanocobalamin (,VITAMIN B-12,) 1000 MCG/ML injection Inject 1,000 mcg into the muscle every 30 (thirty) days.    Marland Kitchen docusate sodium (COLACE) 250 MG capsule Take 250 mg daily by mouth.    . furosemide (LASIX) 40 MG tablet Take 40 mg daily as needed by mouth for fluid or edema.     . gabapentin (NEURONTIN) 100 MG capsule Take 100 mg by mouth 2 (two) times daily.    Marland Kitchen glimepiride (AMARYL) 4 MG tablet Take 4 mg by mouth 2 (two) times daily. If blood sugar is over 200 take 4 mgs twice daily for 30 days     . hydrALAZINE (APRESOLINE) 25 MG tablet Take 25 mg 3 (three) times daily by mouth.    Marland Kitchen HYDROcodone-acetaminophen (NORCO/VICODIN) 5-325 MG tablet Take 1-2 tablets by mouth every 4 (four) hours as needed for moderate pain. 30 tablet 0  . levothyroxine (SYNTHROID, LEVOTHROID) 150 MCG tablet Take 150 mcg daily before breakfast by mouth.    . metoprolol (LOPRESSOR) 50 MG tablet Take 1 tablet (50 mg total) by mouth every 8 (eight) hours. 90 tablet 5  . Multiple Vitamins-Minerals (ICAPS AREDS 2) CAPS Take 1 capsule 2 (two) times daily by mouth.    . Naphazoline HCl (CLEAR EYES OP) Apply 1 drop daily as needed to eye (dry eyes).    . ondansetron (ZOFRAN ODT) 4 MG disintegrating tablet Take 1 tablet (4 mg total) by mouth every 8 (eight) hours as needed for nausea  or vomiting. 20 tablet 0  . promethazine (PHENERGAN) 12.5 MG tablet Take 1 tablet (12.5 mg total) by mouth every 6 (six) hours as needed for nausea or vomiting. 30 tablet 0   No current facility-administered medications for this visit.      Allergies:   Clonidine; Ciprocinonide [fluocinolone]; Darvocet [propoxyphene n-acetaminophen]; Duratuss g [guaifenesin]; Ivp dye [iodinated diagnostic agents]; Ketek  [telithromycin]; Levaquin  [levofloxacin]; Other; and Statins   Social History:  The patient  reports that she quit smoking about 29 years ago. Her smoking use included cigarettes. She has never used smokeless tobacco. She reports that she does not drink alcohol or use drugs.   Family History:   family history includes Breast cancer in her cousin; Cancer in her father; Heart disease in her brother and mother; Hyperlipidemia in her brother; Hypertension in her brother.    Review of Systems: Review of Systems  Constitutional: Negative.   Eyes: Positive for blurred vision.  Respiratory: Negative.   Cardiovascular: Negative.   Gastrointestinal: Negative.  Musculoskeletal: Positive for joint pain.       Gait instability  Neurological: Negative.   Psychiatric/Behavioral: Negative.   All other systems reviewed and are negative.    PHYSICAL EXAM: VS:  BP 130/80 (BP Location: Left Arm, Patient Position: Sitting, Cuff Size: Large)   Pulse 86   Ht 5\' 4"  (1.626 m)   Wt 225 lb 8 oz (102.3 kg)   BMI 38.71 kg/m  , BMI Body mass index is 38.71 kg/m. Constitutional:  oriented to person, place, and time. No distress.  Obese HENT: Head: Grossly normal Eyes:  no discharge. No scleral icterus.  Neck: No JVD, no carotid bruits  Cardiovascular: Regular rate and rhythm, no murmurs appreciated Pulmonary/Chest: Clear to auscultation bilaterally, no wheezes or rails Abdominal: Soft.  no distension.  no tenderness.  Musculoskeletal: Normal range of motion Neurological:  normal muscle tone.  Coordination normal. No atrophy Skin: Skin warm and dry Psychiatric: normal affect, pleasant    Recent Labs: 05/09/2018: ALT 10; BUN 17; Creatinine, Ser 1.35; Hemoglobin 14.0; Platelets 194; Potassium 4.3; Sodium 135    Lipid Panel Lab Results  Component Value Date   CHOL 154 09/09/2012   HDL 31 (L) 09/09/2012   LDLCALC 92 09/09/2012   TRIG 157 (H) 09/09/2012      Wt Readings from Last 3 Encounters:  08/15/18 225 lb 8 oz (102.3 kg)  07/13/18 226 lb (102.5 kg)  07/10/18 226 lb (102.5 kg)       ASSESSMENT AND PLAN:   Essential hypertension - Plan: EKG 12-Lead Blood pressure is well controlled on today's visit. No changes made to the medications. Takes metoprolol and hydralazine 3 times daily  Coronary artery disease involving coronary bypass graft of native heart with angina pectoris (HCC) - No anginal symptoms, no further work-up at this time Does not want a statin  Mixed hyperlipidemia  declined cholesterol medication Running above goal, total cholesterol 220  S/P CABG x 4 Denies any symptoms concerning for angina, no further testing As above no further testing  Acute renal failure with other specified pathological lesion in kidney (HCC) Creatinine 1.2, off Lasix, drinking more fluids  Anemia, unspecified type  Recent CBC reviewed  Weakness Had nerve blocks to her knees bilaterally now with less pain, ambulating more   Total encounter time more than 25 minutes  Greater than 50% was spent in counseling and coordination of care with the patient   Disposition:   F/U  12 months   Orders Placed This Encounter  Procedures  . EKG 12-Lead     Signed, Esmond Plants, M.D., Ph.D. 08/15/2018  [place], [place]

## 2018-08-15 ENCOUNTER — Ambulatory Visit (INDEPENDENT_AMBULATORY_CARE_PROVIDER_SITE_OTHER): Payer: Medicare Other | Admitting: Cardiovascular Disease

## 2018-08-15 ENCOUNTER — Encounter: Payer: Self-pay | Admitting: Cardiovascular Disease

## 2018-08-15 VITALS — BP 130/80 | HR 86 | Ht 64.0 in | Wt 225.5 lb

## 2018-08-15 DIAGNOSIS — I10 Essential (primary) hypertension: Secondary | ICD-10-CM

## 2018-08-15 DIAGNOSIS — I6310 Cerebral infarction due to embolism of unspecified precerebral artery: Secondary | ICD-10-CM

## 2018-08-15 DIAGNOSIS — I25708 Atherosclerosis of coronary artery bypass graft(s), unspecified, with other forms of angina pectoris: Secondary | ICD-10-CM

## 2018-08-15 DIAGNOSIS — E782 Mixed hyperlipidemia: Secondary | ICD-10-CM

## 2018-08-15 DIAGNOSIS — E1159 Type 2 diabetes mellitus with other circulatory complications: Secondary | ICD-10-CM

## 2018-08-15 DIAGNOSIS — Z951 Presence of aortocoronary bypass graft: Secondary | ICD-10-CM

## 2018-08-15 NOTE — Patient Instructions (Signed)

## 2018-09-13 ENCOUNTER — Other Ambulatory Visit: Payer: Self-pay | Admitting: Internal Medicine

## 2018-09-13 DIAGNOSIS — Z1231 Encounter for screening mammogram for malignant neoplasm of breast: Secondary | ICD-10-CM

## 2018-09-16 ENCOUNTER — Other Ambulatory Visit: Payer: Self-pay | Admitting: Internal Medicine

## 2018-09-16 DIAGNOSIS — Z1239 Encounter for other screening for malignant neoplasm of breast: Secondary | ICD-10-CM

## 2018-09-16 DIAGNOSIS — R921 Mammographic calcification found on diagnostic imaging of breast: Secondary | ICD-10-CM

## 2018-10-24 ENCOUNTER — Ambulatory Visit
Admission: RE | Admit: 2018-10-24 | Discharge: 2018-10-24 | Disposition: A | Discharge status: Home / Self Care | Payer: Medicare Other | Source: Ambulatory Visit | Attending: Internal Medicine | Admitting: Internal Medicine

## 2018-10-24 DIAGNOSIS — R921 Mammographic calcification found on diagnostic imaging of breast: Secondary | ICD-10-CM

## 2018-10-24 DIAGNOSIS — Z1239 Encounter for other screening for malignant neoplasm of breast: Secondary | ICD-10-CM | POA: Insufficient documentation

## 2019-08-20 ENCOUNTER — Ambulatory Visit: Payer: Medicare Other | Admitting: Cardiovascular Disease

## 2019-09-01 ENCOUNTER — Ambulatory Visit: Payer: Medicare Other | Admitting: Cardiovascular Disease

## 2019-09-22 ENCOUNTER — Other Ambulatory Visit: Payer: Self-pay | Admitting: Internal Medicine

## 2019-09-22 DIAGNOSIS — Z1231 Encounter for screening mammogram for malignant neoplasm of breast: Secondary | ICD-10-CM

## 2019-10-10 IMAGING — MG MM DIGITAL DIAGNOSTIC BILAT W/ TOMO W/ CAD
8 of 15 series · 8 of 31 positions shown · non-contrast
Comparison: Previous exam(s).

CLINICAL DATA: History of treated right breast cancer, status post
lumpectomy and radiation therapy in 8258.Follow-up of probably
benign right breast lower inner quadrant calcifications.

EXAM:
2D DIGITAL DIAGNOSTIC BILATERAL MAMMOGRAM WITH CAD AND ADJUNCT TOMO

[R ML]
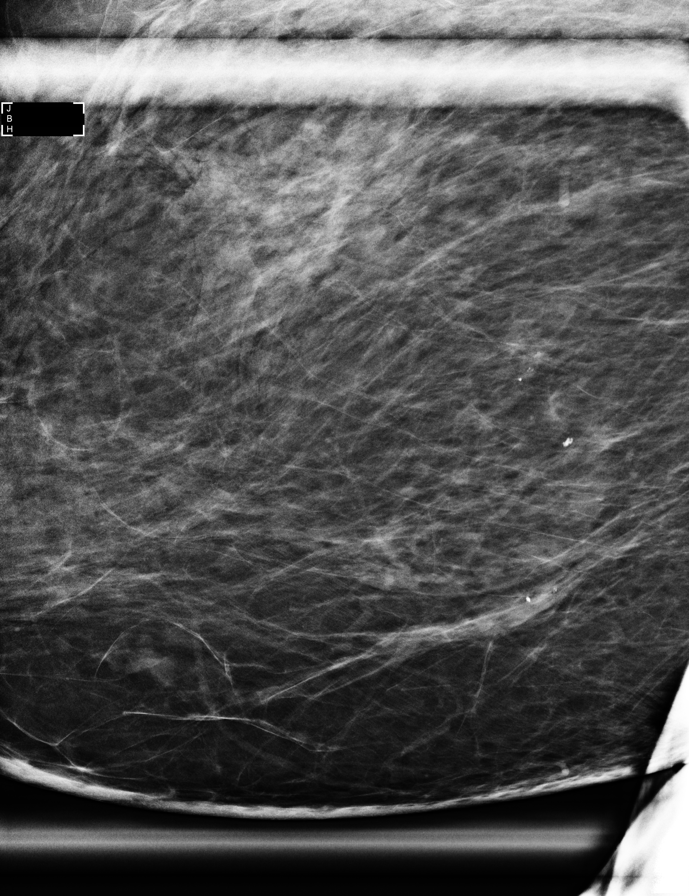

[R CC]
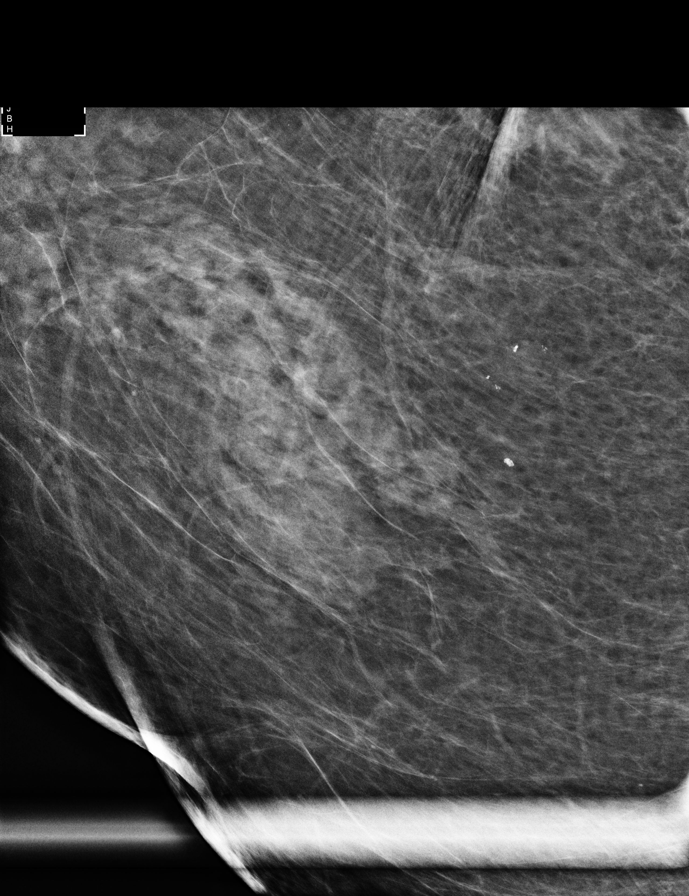

[R MLO (1 of 2)]
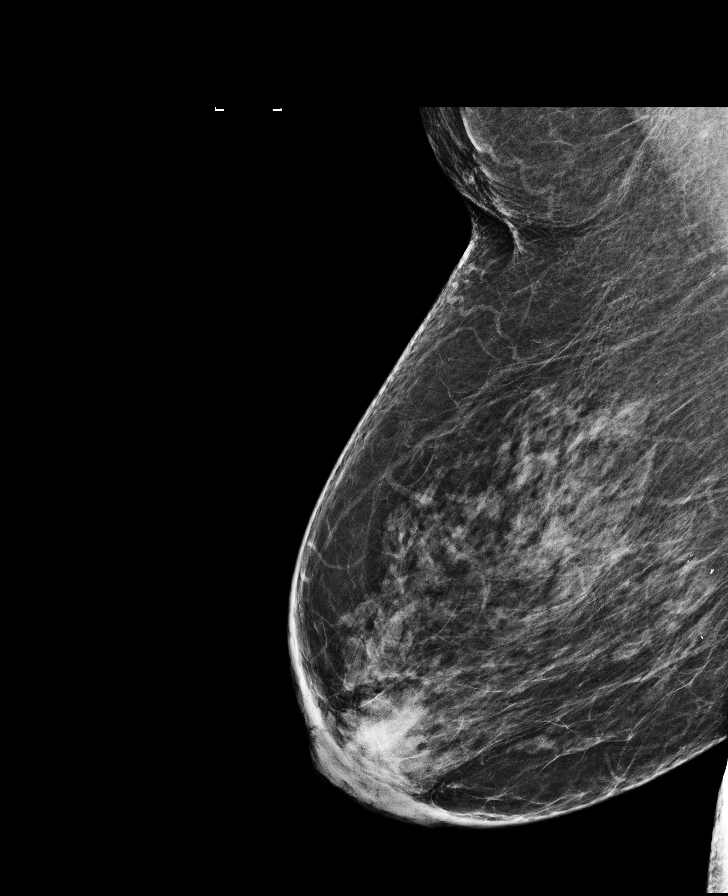

[R CC synth-2D]
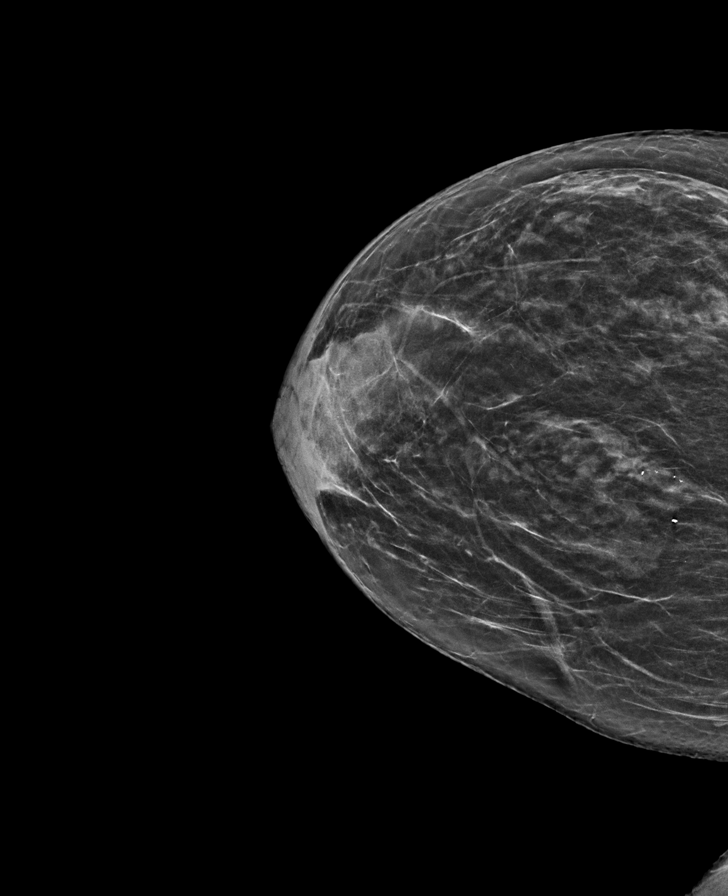

[R MLO (2 of 2)]
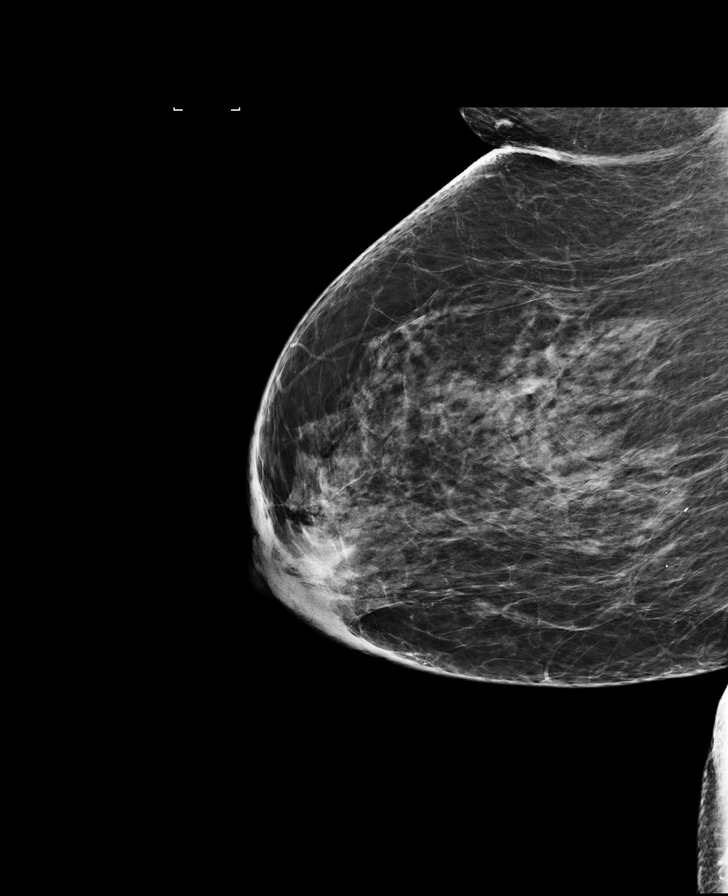

[L MLO synth-2D]
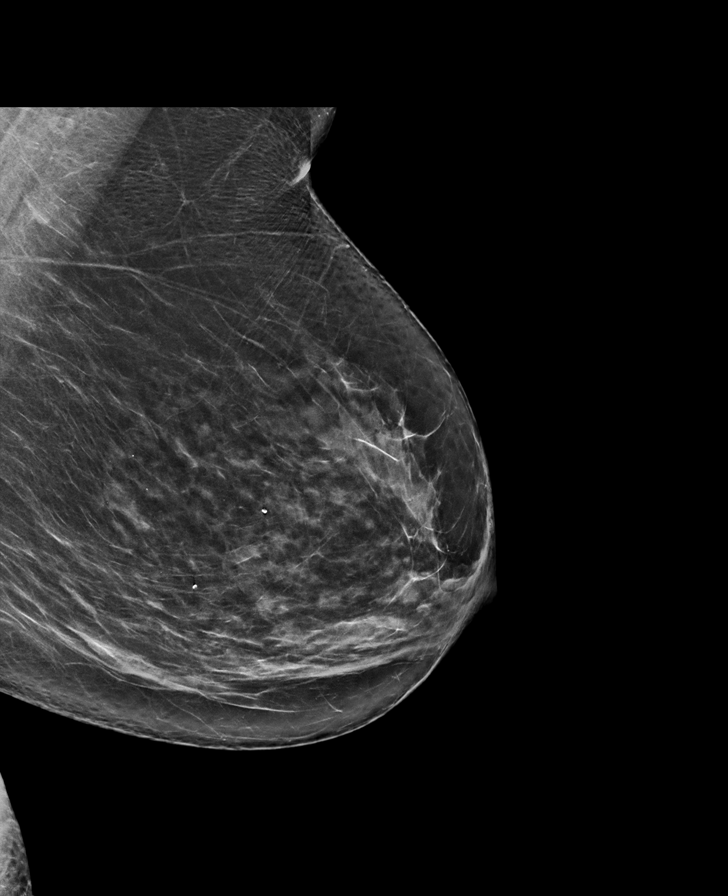

[R MLO synth-2D]
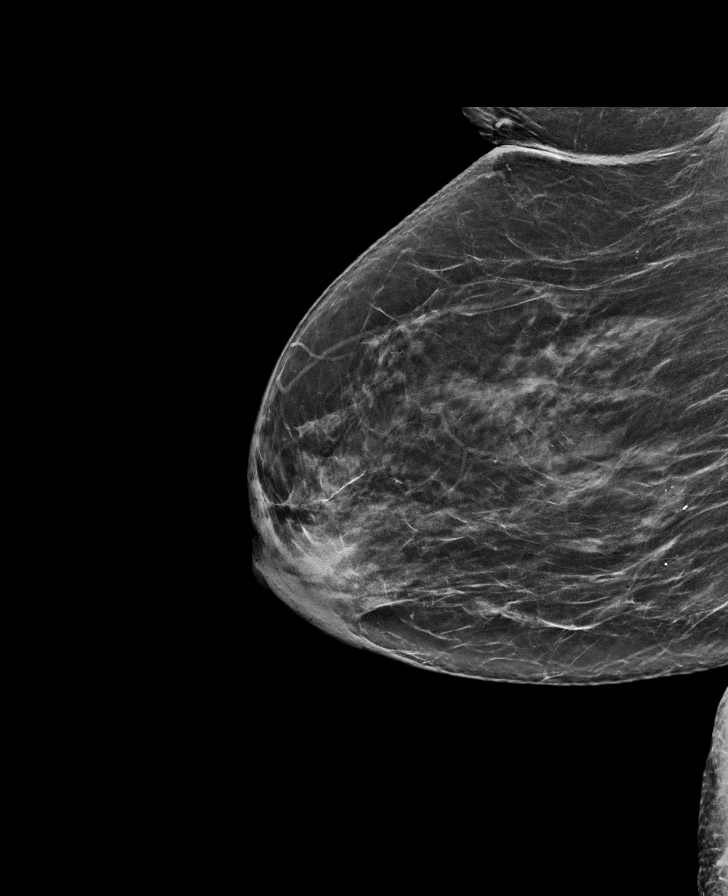

[L CC]
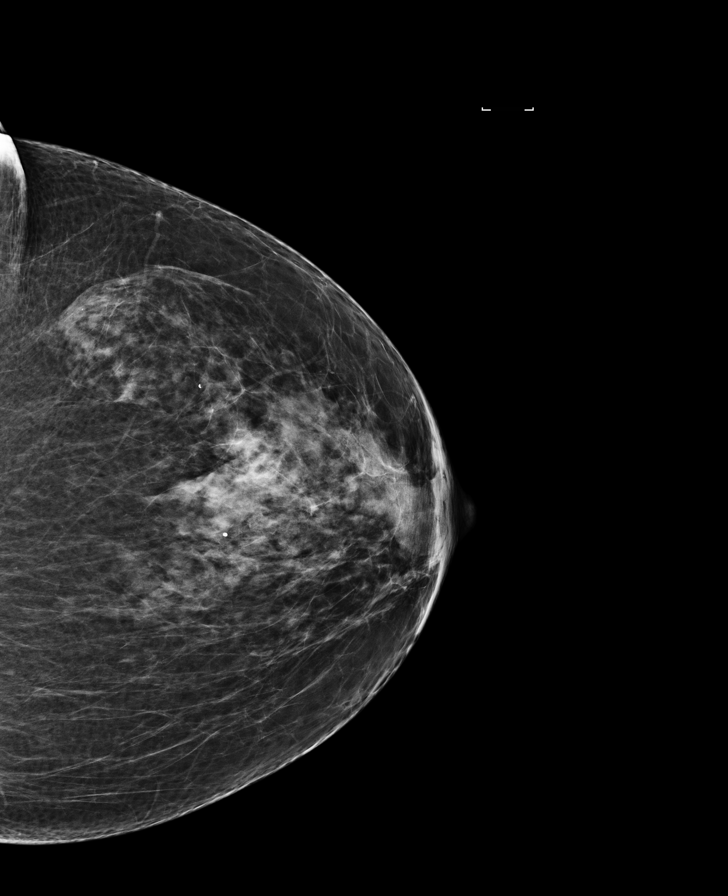

[8 of 31 positions shown; findings below may reference images not displayed]

ACR Breast Density Category b: There are scattered areas of
fibroglandular density.
FINDINGS: The mammographic views of both breasts demonstrate no suspicious
masses, areas of non surgical architectural distortion or clustered
calcifications. The previously demonstrated loosely grouped probably
benign calcifications in the lower inner right breast, posterior
depth, are stable.

Mammographic images were processed with CAD.
IMPRESSION: Stable probably benign right breast lower inner quadrant
calcifications, for which six-month follow-up is recommended.

RECOMMENDATION:
Diagnostic mammogram of the right breast in 6 months.
(Code:AY-[id number])

I have discussed the findings and recommendations with the patient.
Results were also provided in writing at the conclusion of the
visit. If applicable, a reminder letter will be sent to the patient
regarding the next appointment.

BI-RADS CATEGORY  3: Probably benign.

## 2019-10-28 ENCOUNTER — Ambulatory Visit
Admission: RE | Admit: 2019-10-28 | Discharge: 2019-10-28 | Disposition: A | Discharge status: Home / Self Care | Payer: Medicare Other | Source: Ambulatory Visit | Attending: Internal Medicine | Admitting: Internal Medicine

## 2019-10-28 DIAGNOSIS — Z1231 Encounter for screening mammogram for malignant neoplasm of breast: Secondary | ICD-10-CM | POA: Diagnosis present

## 2019-12-03 ENCOUNTER — Other Ambulatory Visit: Payer: Self-pay

## 2019-12-03 ENCOUNTER — Ambulatory Visit (INDEPENDENT_AMBULATORY_CARE_PROVIDER_SITE_OTHER): Payer: Medicare Other | Admitting: Cardiovascular Disease

## 2019-12-03 ENCOUNTER — Encounter: Payer: Self-pay | Admitting: Cardiovascular Disease

## 2019-12-03 VITALS — BP 140/74 | Ht 64.0 in | Wt 209.0 lb

## 2019-12-03 DIAGNOSIS — E1159 Type 2 diabetes mellitus with other circulatory complications: Secondary | ICD-10-CM | POA: Diagnosis not present

## 2019-12-03 DIAGNOSIS — I25708 Atherosclerosis of coronary artery bypass graft(s), unspecified, with other forms of angina pectoris: Secondary | ICD-10-CM | POA: Diagnosis not present

## 2019-12-03 DIAGNOSIS — I10 Essential (primary) hypertension: Secondary | ICD-10-CM

## 2019-12-03 DIAGNOSIS — E782 Mixed hyperlipidemia: Secondary | ICD-10-CM

## 2019-12-03 DIAGNOSIS — Z951 Presence of aortocoronary bypass graft: Secondary | ICD-10-CM | POA: Diagnosis not present

## 2019-12-03 NOTE — Progress Notes (Signed)
Cardiology Office Note  Date:  12/03/2019   ID:  [name], DOB 27-Jul-1934, MRN [id number]  PCP:  Idelle Crouch, MD   Chief Complaint  Patient presents with  . Other    12 month follow up. Patient denies chest pain and SOB at this time. Meds reviewed verbally with patient.     HPI:  [name] is an 86 yo woman with  coronary artery disease,  cardiac catheterization at [facility] 08/22/2012 showing severe distal left main, ostial LAD and circumflex disease also with RCA disease  CABG x4, LIMA to LAD, SVG to Diag, SVG to OM1, SVG to PDA, EVH from bilateral thighs postsurgical stroke with right-sided deficits Chronic renal insufficiency, hydronephrosis Anemia  who presents for followup today of her coronary artery disease, renal failure  Macular degeneration Severe knee pain, does not like knee brace No falls  Presents today in wheel chair Husband drives  Not much swelling Not taking lasix   On metoprolol and hydralazine TID  Lost 17 pounds, dietary changes  Lab work reviewed with her in detail HBA1C 6.4 Total chol 167, she does not want a statin LDL 71  Previous leg swelling on verapamil, amlodipine  EKG personally reviewed by myself on todays visit Shows normal sinus rhythm with rate 86 bpm poor R wave progression to the anterior precordial leads, left axis deviation  Other past medical history reviewed   previously seen Dr. Lucky Cowboy, for venous insufficiency   cardiac catheterization report 08/22/2012 showed distal left main 70% disease, ostial LAD of 80%, proximal LAD 70%, distal LAD diffuse 70%, diagonal #150% followed by discrete 95% lesion, ostial circumflex 90% disease, proximal circumflex 70% disease, proximal RCA 50%, distal RCA 60% PDA branch 60%  PMH:   has a past medical history of Angina pectoris ([place]) (08/26/2012), Arthritis, Breast cancer ([place]) (2012), Breast mass, right, Cancer ([place]), Chronic kidney disease, Complication of anesthesia, Coronary  artery disease (08/22/2012), Diabetes ([place]), Dyspnea, Gastritis, GERD (gastroesophageal reflux disease), Headache(784.0), History of breast cancer, History of seasonal allergies, Hyperlipemia, Hypertension, Hypothyroidism, Macular degeneration, Neuropathy, Personal history of radiation therapy, Pneumonia, PONV (postoperative nausea and vomiting), S/P CABG x 4 (09/05/2012), Stroke [facility]), Thyroid disease, and Vertigo.  PSH:    Past Surgical History:  Procedure Laterality Date  . ABDOMINAL HYSTERECTOMY    . APPENDECTOMY    . BACK SURGERY    . BREAST BIOPSY Right    2012 positive- [facility]  . BREAST LUMPECTOMY Right 2012   F/U radiation   . BREAST SURGERY    . CARDIAC CATHETERIZATION  2014  . CATARACT EXTRACTION W/PHACO Right 08/14/2017   Procedure: CATARACT EXTRACTION PHACO AND INTRAOCULAR LENS PLACEMENT ([facility]);  Surgeon: Birder Robson, MD;  Location: [facility] ORS;  Service: Ophthalmology;  Laterality: Right;  [place] 00:43.0 AP% 17.1 CDE 7.37 Fluid Pack lot # [id number]  . CATARACT EXTRACTION W/PHACO Left 10/09/2017   Procedure: CATARACT EXTRACTION PHACO AND INTRAOCULAR LENS PLACEMENT ([place]);  Surgeon: Birder Robson, MD;  Location: [facility] ORS;  Service: Ophthalmology;  Laterality: Left;  [place] 00:30.3 AP% 11.0 CDE 3.33 Fluid Pack Lot # [id number]  . CORONARY ARTERY BYPASS GRAFT  09/05/2012   Procedure: CORONARY ARTERY BYPASS GRAFTING (CABG);  Surgeon: Rexene Alberts, MD;  Location: [facility];  Service: Open Heart Surgery;  Laterality: N/A;  CABG x four, using left internal mammary artery and bilateral greater saphenous vein harvested endoscopically  . INTRAOPERATIVE TRANSESOPHAGEAL ECHOCARDIOGRAM  09/05/2012   Procedure: INTRAOPERATIVE TRANSESOPHAGEAL ECHOCARDIOGRAM;  Surgeon: Rexene Alberts, MD;  Location: [facility];  Service: Open Heart Surgery;  Laterality: N/A;  . KNEE SURGERY     Bilateral nerve block  . LAPAROSCOPIC NISSEN FUNDOPLICATION    . OVARIAN CYST REMOVAL      Current Outpatient Medications   Medication Sig Dispense Refill  . ALPRAZolam (XANAX) 0.5 MG tablet Take 0.5 mg daily as needed by mouth for anxiety.     Marland Kitchen aspirin 81 MG chewable tablet Chew 81 mg by mouth daily.    . butalbital-acetaminophen-caffeine (ESGIC) 50-325-40 MG tablet Take 1 tablet daily as needed by mouth for headache or migraine.    . Calcium Carbonate-Vitamin D (CALCIUM 600+D PO) Take 1 tablet daily by mouth.    . Cyanocobalamin 1000 MCG SUBL Take by mouth.    . docusate sodium (COLACE) 250 MG capsule Take 250 mg daily by mouth.    . furosemide (LASIX) 40 MG tablet Take 40 mg daily as needed by mouth for fluid or edema.     . gabapentin (NEURONTIN) 100 MG capsule Take 100 mg by mouth 2 (two) times daily.    Marland Kitchen glimepiride (AMARYL) 4 MG tablet Take 4 mg by mouth 2 (two) times daily. If blood sugar is over 200 take 4 mgs twice daily for 30 days     . hydrALAZINE (APRESOLINE) 25 MG tablet Take 25 mg 3 (three) times daily by mouth.    Marland Kitchen HYDROcodone-acetaminophen (NORCO/VICODIN) 5-325 MG tablet Take 1-2 tablets by mouth every 4 (four) hours as needed for moderate pain. 30 tablet 0  . levothyroxine (SYNTHROID, LEVOTHROID) 150 MCG tablet Take 150 mcg daily before breakfast by mouth.    . metoprolol (LOPRESSOR) 50 MG tablet Take 1 tablet (50 mg total) by mouth every 8 (eight) hours. 90 tablet 5  . Multiple Vitamins-Minerals (ICAPS AREDS 2) CAPS Take 1 capsule 2 (two) times daily by mouth.    . Naphazoline HCl (CLEAR EYES OP) Apply 1 drop daily as needed to eye (dry eyes).    . ondansetron (ZOFRAN ODT) 4 MG disintegrating tablet Take 1 tablet (4 mg total) by mouth every 8 (eight) hours as needed for nausea or vomiting. 20 tablet 0  . promethazine (PHENERGAN) 12.5 MG tablet Take 1 tablet (12.5 mg total) by mouth every 6 (six) hours as needed for nausea or vomiting. 30 tablet 0   No current facility-administered medications for this visit.     Allergies:   Clonidine, Ciprocinonide [fluocinolone], Darvocet [propoxyphene  n-acetaminophen], Duratuss g [guaifenesin], Ivp dye [iodinated diagnostic agents], Ketek  [telithromycin], Levaquin  [levofloxacin], Other, and Statins   Social History:  The patient  reports that she quit smoking about 31 years ago. Her smoking use included cigarettes. She has never used smokeless tobacco. She reports that she does not drink alcohol or use drugs.   Family History:   family history includes Breast cancer in her cousin; Cancer in her father; Heart disease in her brother and mother; Hyperlipidemia in her brother; Hypertension in her brother.    Review of Systems: Review of Systems  Constitutional: Negative.   Eyes: Positive for blurred vision.  Respiratory: Negative.   Cardiovascular: Negative.   Gastrointestinal: Negative.   Musculoskeletal: Positive for joint pain.       Gait instability  Neurological: Negative.   Psychiatric/Behavioral: Negative.   All other systems reviewed and are negative.   PHYSICAL EXAM: VS:  BP 140/74 (BP Location: Left Arm, Patient Position: Sitting, Cuff Size: Normal)   Ht 5\' 4"  (1.626 m)   Wt 209  lb (94.8 kg)   BMI 35.87 kg/m  , BMI Body mass index is 35.87 kg/m. Constitutional:  oriented to person, place, and time. No distress.  HENT:  Head: Grossly normal Eyes:  no discharge. No scleral icterus.  Neck: No JVD, no carotid bruits  Cardiovascular: Regular rate and rhythm, no murmurs appreciated Pulmonary/Chest: Clear to auscultation bilaterally, no wheezes or rails Abdominal: Soft.  no distension.  no tenderness.  Musculoskeletal: Nomal range of motion Neurological:  normal muscle tone. Coordination normal. No atrophy Skin: Skin warm and dry Psychiatric: normal affect, pleasant   Recent Labs: No results found for requested labs within last 8760 hours.    Lipid Panel Lab Results  Component Value Date   CHOL 154 09/09/2012   HDL 31 (L) 09/09/2012   LDLCALC 92 09/09/2012   TRIG 157 (H) 09/09/2012      Wt Readings from  Last 3 Encounters:  12/03/19 209 lb (94.8 kg)  08/15/18 225 lb 8 oz (102.3 kg)  07/13/18 226 lb (102.5 kg)       ASSESSMENT AND PLAN:  Essential hypertension -  Blood pressure is well controlled on today's visit. No changes made to the medications.  Continue metoprolol and hydralazine  Coronary artery disease involving coronary bypass graft of native heart with angina pectoris (HCC) - Currently with no symptoms of angina. No further workup at this time. Continue current medication regimen. Does not want medications for cholesterol  Mixed hyperlipidemia  declined cholesterol medication After weight loss numbers improved 164, LDL 71  S/P CABG x 4  No angina, sedentary  Acute renal failure with other specified pathological lesion in kidney (HCC) Creatinine 1.4, off Lasix,  No edema  Anemia, unspecified type  Recent CBC reviewed Stable HBG 13.6  Weakness Had nerve blocks to her knees bilaterally  Not much walking Recommended low impact activity   Total encounter time more than 25 minutes  Greater than 50% was spent in counseling and coordination of care with the patient   Disposition:   F/U  12 months   Orders Placed This Encounter  Procedures  . EKG 12-Lead     Signed, Esmond Plants, M.D., Ph.D. 12/03/2019  [place], [place]

## 2019-12-03 NOTE — Patient Instructions (Signed)

## 2020-02-16 DIAGNOSIS — G8929 Other chronic pain: Secondary | ICD-10-CM | POA: Insufficient documentation

## 2020-02-16 DIAGNOSIS — G894 Chronic pain syndrome: Secondary | ICD-10-CM | POA: Insufficient documentation

## 2020-04-23 ENCOUNTER — Encounter: Payer: Self-pay | Admitting: Emergency Medicine

## 2020-04-23 ENCOUNTER — Emergency Department: Payer: Medicare Other

## 2020-04-23 ENCOUNTER — Emergency Department
Admission: EM | Admit: 2020-04-23 | Discharge: 2020-04-23 | Disposition: A | Discharge status: Home / Self Care | Payer: Medicare Other | Attending: Emergency Medicine | Admitting: Emergency Medicine

## 2020-04-23 ENCOUNTER — Other Ambulatory Visit: Payer: Self-pay

## 2020-04-23 DIAGNOSIS — Z8673 Personal history of transient ischemic attack (TIA), and cerebral infarction without residual deficits: Secondary | ICD-10-CM | POA: Insufficient documentation

## 2020-04-23 DIAGNOSIS — R9431 Abnormal electrocardiogram [ECG] [EKG]: Secondary | ICD-10-CM | POA: Diagnosis not present

## 2020-04-23 DIAGNOSIS — G319 Degenerative disease of nervous system, unspecified: Secondary | ICD-10-CM | POA: Diagnosis not present

## 2020-04-23 DIAGNOSIS — R531 Weakness: Secondary | ICD-10-CM | POA: Diagnosis present

## 2020-04-23 DIAGNOSIS — Z5321 Procedure and treatment not carried out due to patient leaving prior to being seen by health care provider: Secondary | ICD-10-CM | POA: Diagnosis not present

## 2020-04-23 LAB — CBC
HCT: 33.1 % — ABNORMAL LOW (ref 36.0–46.0)
Hemoglobin: 10.7 g/dL — ABNORMAL LOW (ref 12.0–15.0)
MCH: 28.3 pg (ref 26.0–34.0)
MCHC: 32.3 g/dL (ref 30.0–36.0)
MCV: 87.6 fL (ref 80.0–100.0)
Platelets: 171 10*3/uL (ref 150–400)
RBC: 3.78 MIL/uL — ABNORMAL LOW (ref 3.87–5.11)
RDW: 15 % (ref 11.5–15.5)
WBC: 7.6 10*3/uL (ref 4.0–10.5)
nRBC: 0 % (ref 0.0–0.2)

## 2020-04-23 LAB — DIFFERENTIAL
Abs Immature Granulocytes: 0.16 10*3/uL — ABNORMAL HIGH (ref 0.00–0.07)
Basophils Absolute: 0.1 10*3/uL (ref 0.0–0.1)
Basophils Relative: 1 %
Eosinophils Absolute: 0.2 10*3/uL (ref 0.0–0.5)
Eosinophils Relative: 3 %
Immature Granulocytes: 2 %
Lymphocytes Relative: 12 %
Lymphs Abs: 0.9 10*3/uL (ref 0.7–4.0)
Monocytes Absolute: 0.5 10*3/uL (ref 0.1–1.0)
Monocytes Relative: 7 %
Neutro Abs: 5.7 10*3/uL (ref 1.7–7.7)
Neutrophils Relative %: 75 %

## 2020-04-23 LAB — COMPREHENSIVE METABOLIC PANEL
ALT: 8 U/L (ref 0–44)
AST: 11 U/L — ABNORMAL LOW (ref 15–41)
Albumin: 3.3 g/dL — ABNORMAL LOW (ref 3.5–5.0)
Alkaline Phosphatase: 81 U/L (ref 38–126)
Anion gap: 12 (ref 5–15)
BUN: 45 mg/dL — ABNORMAL HIGH (ref 8–23)
CO2: 23 mmol/L (ref 22–32)
Calcium: 8.3 mg/dL — ABNORMAL LOW (ref 8.9–10.3)
Chloride: 102 mmol/L (ref 98–111)
Creatinine, Ser: 3.4 mg/dL — ABNORMAL HIGH (ref 0.44–1.00)
GFR calc Af Amer: 14 mL/min — ABNORMAL LOW (ref >60)
GFR calc non Af Amer: 12 mL/min — ABNORMAL LOW (ref >60)
Glucose, Bld: 132 mg/dL — ABNORMAL HIGH (ref 70–99)
Potassium: 4.2 mmol/L (ref 3.5–5.1)
Sodium: 137 mmol/L (ref 135–145)
Total Bilirubin: 0.8 mg/dL (ref 0.3–1.2)
Total Protein: 8.5 g/dL — ABNORMAL HIGH (ref 6.5–8.1)

## 2020-04-23 LAB — APTT: aPTT: 36 seconds (ref 24–36)

## 2020-04-23 LAB — PROTIME-INR
INR: 0.9 (ref 0.8–1.2)
Prothrombin Time: 12.2 seconds (ref 11.4–15.2)

## 2020-04-23 MED ORDER — SODIUM CHLORIDE 0.9% FLUSH: 3.0000 mL | Freq: Once | INTRAVENOUS | Status: DC

## 2020-04-23 NOTE — ED Triage Notes (Signed)
Pt in via EMS from home with c/o right sided weakness that started yesterday.

## 2020-04-23 NOTE — ED Notes (Signed)
Pt called for repeat VS x 2, pt not visualized in ED by this RN.

## 2020-04-23 NOTE — ED Notes (Signed)
Pt called for repeat VS x 3, pt not visualized in ED at this time.

## 2020-04-23 NOTE — ED Notes (Signed)
Pt called for repeat VS x 1.

## 2020-04-23 NOTE — ED Triage Notes (Signed)
Here for right arm weakness.  Hx CVA with residual right side affects but pt has new weakness in RUE since lunch time yesterday.  Mild drift in triage. Denies numbness.  No speech difficulty.

## 2020-04-26 ENCOUNTER — Ambulatory Visit (INDEPENDENT_AMBULATORY_CARE_PROVIDER_SITE_OTHER): Payer: Medicare Other

## 2020-04-26 ENCOUNTER — Telehealth: Payer: Self-pay | Admitting: Cardiology

## 2020-04-26 ENCOUNTER — Encounter: Payer: Self-pay | Admitting: Cardiology

## 2020-04-26 ENCOUNTER — Telehealth: Payer: Self-pay | Admitting: Cardiovascular Disease

## 2020-04-26 ENCOUNTER — Ambulatory Visit (INDEPENDENT_AMBULATORY_CARE_PROVIDER_SITE_OTHER): Payer: Medicare Other | Admitting: Cardiology

## 2020-04-26 ENCOUNTER — Other Ambulatory Visit: Payer: Self-pay

## 2020-04-26 VITALS — BP 142/78 | HR 88 | Ht 64.0 in | Wt 210.2 lb

## 2020-04-26 DIAGNOSIS — I63321 Cerebral infarction due to thrombosis of right anterior cerebral artery: Secondary | ICD-10-CM

## 2020-04-26 DIAGNOSIS — R29898 Other symptoms and signs involving the musculoskeletal system: Secondary | ICD-10-CM

## 2020-04-26 DIAGNOSIS — I2510 Atherosclerotic heart disease of native coronary artery without angina pectoris: Secondary | ICD-10-CM

## 2020-04-26 DIAGNOSIS — I471 Supraventricular tachycardia: Secondary | ICD-10-CM | POA: Diagnosis not present

## 2020-04-26 NOTE — Telephone Encounter (Signed)
Patient calling in after going to the ED on Friday. Patient states her right arm dropped on her. Patient has a history of stroke on right side. Patient was told she did not have a stroke. There were labs and test done. Patient never saw a doctor and could not wait the 7 hours they were estimating to her. Patient states her arm is still dropping on her. Patient has no idea what the CT scan said or why this may be happening.  High priority per patient request, she really wants to be seen today Please advise

## 2020-04-26 NOTE — Telephone Encounter (Signed)
Patient is calling in with complaints of the zio heart monitor that was placed today. Patient states it is too heavy with her arm dropping. Patient is wanting to know if she can remove it.   Please advise

## 2020-04-26 NOTE — Progress Notes (Signed)
Cardiology Office Note:    Date:  04/26/2020   ID:  [name], DOB 07-06-1934, MRN [id number]  PCP:  Idelle Crouch, MD  [place] Cardiologist:  No primary care provider on file.  [facility] Electrophysiologist:  None   Referring MD: Idelle Crouch, MD   Chief Complaint  Patient presents with  . other    F/u ED due to right arm weakness and unable to use arm. Meds reviewed verbally with pt.    History of Present Illness:    [name] is a 86 y.o. female with a hx of CAD/CABG x4, CVA 2011 who presents due to right arm weakness.  Patient states having an episode of right arm weakness 4 days ago.  She states having a [place] right arm that has persisted.  Presented to the ED 3 days ago after symptoms did not resolve.  A head CT was obtained but patient did not wait for results due to long wait time.  Head CT report reviewed by myself showing no acute changes.  She had a prior stroke in 2011 causing right lower extremity weakness.  She denies any episodes of chest pain, shortness of breath, palpitations.  Takes her medications as prescribed.  Not on statin due to long history of statin allergies.  Past Medical History:  Diagnosis Date  . Angina pectoris ([place]) 08/26/2012  . Arthritis   . Breast cancer ([place]) 2012   right breast  . Breast mass, right   . Cancer [facility])    breast cancer, right side 2012  . Chronic kidney disease    RENAL INSUFF  . Complication of anesthesia   . Coronary artery disease 08/22/2012   sees Dr Rockey Situ  . Diabetes ([place])   . Dyspnea    ON EXERTION  . Gastritis    hx of  . GERD (gastroesophageal reflux disease)   . Headache(784.0)    migraines  . History of breast cancer    39 treatments of radiation. Negative chemo.  Marland Kitchen History of seasonal allergies   . Hyperlipemia   . Hypertension    sees Dr. Fulton Reek  . Hypothyroidism   . Macular degeneration    Bilateral  . Neuropathy   . Personal history of radiation  therapy   . Pneumonia    hx of  . PONV (postoperative nausea and vomiting)   . 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 Stroke ([place])    2012  . Thyroid disease   . Vertigo     Past Surgical History:  Procedure Laterality Date  . ABDOMINAL HYSTERECTOMY    . APPENDECTOMY    . BACK SURGERY    . BREAST BIOPSY Right    2012 positive- [facility]  . BREAST LUMPECTOMY Right 2012   F/U radiation   . BREAST SURGERY    . CARDIAC CATHETERIZATION  2014  . CATARACT EXTRACTION W/PHACO Right 08/14/2017   Procedure: CATARACT EXTRACTION PHACO AND INTRAOCULAR LENS PLACEMENT (IOC);  Surgeon: Birder Robson, MD;  Location: [facility] ORS;  Service: Ophthalmology;  Laterality: Right;  [place] 00:43.0 AP% 17.1 CDE 7.37 Fluid Pack lot # [id number]  . CATARACT EXTRACTION W/PHACO Left 10/09/2017   Procedure: CATARACT EXTRACTION PHACO AND INTRAOCULAR LENS PLACEMENT ([place]);  Surgeon: Birder Robson, MD;  Location: [facility] ORS;  Service: Ophthalmology;  Laterality: Left;  [place] 00:30.3 AP% 11.0 CDE 3.33 Fluid Pack Lot #  [id number]  . CORONARY ARTERY BYPASS GRAFT  09/05/2012   Procedure: CORONARY ARTERY BYPASS GRAFTING (CABG);  Surgeon: Rexene Alberts, MD;  Location: [place];  Service: Open Heart Surgery;  Laterality: N/A;  CABG x four, using left internal mammary artery and bilateral greater saphenous vein harvested endoscopically  . INTRAOPERATIVE TRANSESOPHAGEAL ECHOCARDIOGRAM  09/05/2012   Procedure: INTRAOPERATIVE TRANSESOPHAGEAL ECHOCARDIOGRAM;  Surgeon: Rexene Alberts, MD;  Location: [place];  Service: Open Heart Surgery;  Laterality: N/A;  . KNEE SURGERY     Bilateral nerve block  . LAPAROSCOPIC NISSEN FUNDOPLICATION    . OVARIAN CYST REMOVAL      Current Medications: Current Meds  Medication Sig  . ALPRAZolam (XANAX) 0.5 MG tablet Take 0.5 mg daily as needed by mouth for anxiety.   Marland Kitchen aspirin 81 MG chewable tablet Chew 81 mg by mouth daily.  .  butalbital-acetaminophen-caffeine (ESGIC) 50-325-40 MG tablet Take 1 tablet daily as needed by mouth for headache or migraine.  . Calcium Carbonate-Vitamin D (CALCIUM 600+D PO) Take 1 tablet daily by mouth.  . Cyanocobalamin 1000 MCG SUBL Take by mouth.  . docusate sodium (COLACE) 250 MG capsule Take 250 mg daily by mouth.  . furosemide (LASIX) 40 MG tablet Take 40 mg daily as needed by mouth for fluid or edema.   . gabapentin (NEURONTIN) 100 MG capsule Take 100 mg by mouth 2 (two) times daily.  Marland Kitchen glimepiride (AMARYL) 4 MG tablet Take 4 mg by mouth 2 (two) times daily. If blood sugar is over 200 take 4 mgs twice daily for 30 days   . hydrALAZINE (APRESOLINE) 25 MG tablet Take 25 mg 3 (three) times daily by mouth.  Marland Kitchen HYDROcodone-acetaminophen (NORCO/VICODIN) 5-325 MG tablet Take 1-2 tablets by mouth every 4 (four) hours as needed for moderate pain.  Marland Kitchen levothyroxine (SYNTHROID, LEVOTHROID) 150 MCG tablet Take 150 mcg daily before breakfast by mouth.  . metoprolol (LOPRESSOR) 50 MG tablet Take 1 tablet (50 mg total) by mouth every 8 (eight) hours.  . Multiple Vitamins-Minerals (ICAPS AREDS 2) CAPS Take 1 capsule 2 (two) times daily by mouth.  . Naphazoline HCl (CLEAR EYES OP) Apply 1 drop daily as needed to eye (dry eyes).  . ondansetron (ZOFRAN ODT) 4 MG disintegrating tablet Take 1 tablet (4 mg total) by mouth every 8 (eight) hours as needed for nausea or vomiting.  . promethazine (PHENERGAN) 12.5 MG tablet Take 1 tablet (12.5 mg total) by mouth every 6 (six) hours as needed for nausea or vomiting.     Allergies:   Clonidine, Ciprocinonide [fluocinolone], Darvocet [propoxyphene n-acetaminophen], Duratuss g [guaifenesin], Ivp dye [iodinated diagnostic agents], Ketek  [telithromycin], Levaquin  [levofloxacin], Other, and Statins   Social History   Socioeconomic History  . Marital status: Married    Spouse name: Not on file  . Number of children: 0  . Years of education: Not on file  . Highest  education level: Not on file  Occupational History  . Occupation: retired  Tobacco Use  . Smoking status: Former Smoker    Types: Cigarettes    Quit date: 11/13/1988    Years since quitting: 31.4  . Smokeless tobacco: Never Used  Substance and Sexual Activity  . Alcohol use: No  . Drug use: No  . Sexual activity: Not on file  Other Topics Concern  . Not on file  Social History Narrative  . Not on file   Social Determinants of Health   Financial Resource Strain:   . Difficulty of  Paying Living Expenses:   Food Insecurity:   . Worried About Charity fundraiser in the Last Year:   . Arboriculturist in the Last Year:   Transportation Needs:   . Film/video editor (Medical):   Marland Kitchen Lack of Transportation (Non-Medical):   Physical Activity:   . Days of Exercise per Week:   . Minutes of Exercise per Session:   Stress:   . Feeling of Stress :   Social Connections:   . Frequency of Communication with Friends and Family:   . Frequency of Social Gatherings with Friends and Family:   . Attends Religious Services:   . Active Member of Clubs or Organizations:   . Attends Archivist Meetings:   Marland Kitchen Marital Status:      Family History: The patient's family history includes Breast cancer in her cousin; Cancer in her father; Heart disease in her brother and mother; Hyperlipidemia in her brother; Hypertension in her brother. There is no history of Kidney disease.  ROS:   Please see the history of present illness.     All other systems reviewed and are negative.  EKGs/Labs/Other Studies Reviewed:    The following studies were reviewed today:   EKG:  EKG is  ordered today.  The ekg ordered today demonstrates normal sinus rhythm heart rate 88  Recent Labs: 04/23/2020: ALT 8; BUN 45; Creatinine, Ser 3.40; Hemoglobin 10.7; Platelets 171; Potassium 4.2; Sodium 137  Recent Lipid Panel    Component Value Date/Time   CHOL 154 09/09/2012 0344   TRIG 157 (H) 09/09/2012 0344    HDL 31 (L) 09/09/2012 0344   CHOLHDL 5.0 09/09/2012 0344   VLDL 31 09/09/2012 0344   LDLCALC 92 09/09/2012 0344    Physical Exam:    VS:  BP (!) 142/78 (BP Location: Left Arm, Patient Position: Sitting, Cuff Size: Large)   Pulse 88   Ht 5\' 4"  (1.626 m)   Wt (!) 210 lb 4 oz (95.4 kg)   SpO2 97%   BMI 36.09 kg/m     Wt Readings from Last 3 Encounters:  04/26/20 (!) 210 lb 4 oz (95.4 kg)  04/23/20 (!) 209 lb (94.8 kg)  12/03/19 209 lb (94.8 kg)     GEN:  Well nourished, well developed in no acute distress HEENT: Normal NECK: No JVD; No carotid bruits LYMPHATICS: No lymphadenopathy CARDIAC: RRR, no murmurs, rubs, gallops RESPIRATORY:  Clear to auscultation without rales, wheezing or rhonchi  ABDOMEN: Soft, non-tender, non-distended MUSCULOSKELETAL:  No edema; right arm weakness, right leg weakness noted SKIN: Warm and dry NEUROLOGIC:  Alert and oriented x 3 PSYCHIATRIC:  Normal affect   ASSESSMENT:    1. Weakness of right arm   2. Coronary artery disease involving native coronary artery of native heart without angina pectoris    PLAN:    In order of problems listed above:  1. Patient with new right arm weakness and whole right leg weakness.  EKG shows sinus rhythm, head CT in the emergency room with no acute change.  Will place cardiac monitor to evaluate any significant arrhythmia such as A. fib or flutter that can lead to strokes, especially in this patient with prior CVA.  Will refer patient to neurology for further input, and or MRI consideration. 2. History of CAD/CABG, continue aspirin, patient allergic to statin.  Follow-up after cardiac monitor with Dr. Rockey Situ and or PA  Total encounter time 40 minutes  Greater than 50% was spent in counseling  and coordination of care with the patient    Medication Adjustments/Labs and Tests Ordered: Current medicines are reviewed at length with the patient today.  Concerns regarding medicines are outlined above.  Orders  Placed This Encounter  Procedures  . Ambulatory referral to Neurology  . LONG TERM MONITOR (3-14 DAYS)   No orders of the defined types were placed in this encounter.   Patient Instructions  Medication Instructions:  Your physician recommends that you continue on your current medications as directed. Please refer to the Current Medication list given to you today.  *If you need a refill on your cardiac medications before your next appointment, please call your pharmacy*   Lab Work: None ordered If you have labs (blood work) drawn today and your tests are completely normal, you will receive your results only by: Marland Kitchen MyChart Message (if you have MyChart) OR . A paper copy in the mail If you have any lab test that is abnormal or we need to change your treatment, we will call you to review the results.   Testing/Procedures: (To be worn for 14 days) Your physician has recommended that you wear a Zio monitor. This monitor is a medical device that records the heart's electrical activity. Doctors most often use these monitors to diagnose arrhythmias. Arrhythmias are problems with the speed or rhythm of the heartbeat. The monitor is a small device applied to your chest. You can wear one while you do your normal daily activities. While wearing this monitor if you have any symptoms to push the button and record what you felt. Once you have worn this monitor for the period of time provider prescribed (Usually 14 days), you will return the monitor device in the postage paid box. Once it is returned they will download the data collected and provide [place] with a report which the provider will then review and we will call you with those results. Important tips:  1. Avoid showering during the first 24 hours of wearing the monitor. 2. Avoid excessive sweating to help maximize wear time. 3. Do not submerge the device, no hot tubs, and no swimming pools. 4. Keep any lotions or oils away from the patch. 5. After  24 hours you may shower with the patch on. Take brief showers with your back facing the shower head.  6. Do not remove patch once it has been placed because that will interrupt data and decrease adhesive wear time. 7. Push the button when you have any symptoms and write down what you were feeling. 8. Once you have completed wearing your monitor, remove and place into box which has postage paid and place in your outgoing mailbox.  9. If for some reason you have misplaced your box then call our office and we can provide another box and/or mail it off for you.         Follow-Up: At [facility], you and your health needs are our priority.  As part of our continuing mission to provide you with exceptional heart care, we have created designated Provider Care Teams.  These Care Teams include your primary Cardiologist (physician) and Advanced Practice Providers (APPs -  Physician Assistants and Nurse Practitioners) who all work together to provide you with the care you need, when you need it.  We recommend signing up for the patient portal called "MyChart".  Sign up information is provided on this After Visit Summary.  MyChart is used to connect with patients for Virtual Visits (Telemedicine).  Patients are able  to view lab/test results, encounter notes, upcoming appointments, etc.  Non-urgent messages can be sent to your provider as well.   To learn more about what you can do with MyChart, go to [email or web address].    Your next appointment:   6 week(s)  The format for your next appointment:   In Person  Provider:    You may see  Dr. Rockey Situ or one of the following Advanced Practice Providers on your designated Care Team:    Murray Hodgkins, NP  Christell Faith, PA-C  Marrianne Mood, PA-C    Other Instructions You have been referred to [facility]. Their office will contact you directly to schedule the appointment.      Signed, Kate Sable, MD  04/26/2020  12:54 PM    [place]

## 2020-04-26 NOTE — Patient Instructions (Signed)
Medication Instructions:  Your physician recommends that you continue on your current medications as directed. Please refer to the Current Medication list given to you today.  *If you need a refill on your cardiac medications before your next appointment, please call your pharmacy*   Lab Work: None ordered If you have labs (blood work) drawn today and your tests are completely normal, you will receive your results only by: Marland Kitchen MyChart Message (if you have MyChart) OR . A paper copy in the mail If you have any lab test that is abnormal or we need to change your treatment, we will call you to review the results.   Testing/Procedures: (To be worn for 14 days) Your physician has recommended that you wear a Zio monitor. This monitor is a medical device that records the heart's electrical activity. Doctors most often use these monitors to diagnose arrhythmias. Arrhythmias are problems with the speed or rhythm of the heartbeat. The monitor is a small device applied to your chest. You can wear one while you do your normal daily activities. While wearing this monitor if you have any symptoms to push the button and record what you felt. Once you have worn this monitor for the period of time provider prescribed (Usually 14 days), you will return the monitor device in the postage paid box. Once it is returned they will download the data collected and provide [place] with a report which the provider will then review and we will call you with those results. Important tips:  1. Avoid showering during the first 24 hours of wearing the monitor. 2. Avoid excessive sweating to help maximize wear time. 3. Do not submerge the device, no hot tubs, and no swimming pools. 4. Keep any lotions or oils away from the patch. 5. After 24 hours you may shower with the patch on. Take brief showers with your back facing the shower head.  6. Do not remove patch once it has been placed because that will interrupt data and decrease  adhesive wear time. 7. Push the button when you have any symptoms and write down what you were feeling. 8. Once you have completed wearing your monitor, remove and place into box which has postage paid and place in your outgoing mailbox.  9. If for some reason you have misplaced your box then call our office and we can provide another box and/or mail it off for you.         Follow-Up: At [facility], you and your health needs are our priority.  As part of our continuing mission to provide you with exceptional heart care, we have created designated Provider Care Teams.  These Care Teams include your primary Cardiologist (physician) and Advanced Practice Providers (APPs -  Physician Assistants and Nurse Practitioners) who all work together to provide you with the care you need, when you need it.  We recommend signing up for the patient portal called "MyChart".  Sign up information is provided on this After Visit Summary.  MyChart is used to connect with patients for Virtual Visits (Telemedicine).  Patients are able to view lab/test results, encounter notes, upcoming appointments, etc.  Non-urgent messages can be sent to your provider as well.   To learn more about what you can do with MyChart, go to [email or web address].    Your next appointment:   6 week(s)  The format for your next appointment:   In Person  Provider:    You may see  Dr. Rockey Situ or one of the  following Advanced Practice Providers on your designated Care Team:    Murray Hodgkins, NP  Christell Faith, PA-C  Marrianne Mood, PA-C    Other Instructions You have been referred to [facility]. Their office will contact you directly to schedule the appointment.

## 2020-04-26 NOTE — Telephone Encounter (Signed)
Call to patient to discuss current complaint.   Pt reports she was seen in the ED on Friday for c/o weakness in R arm. Left after triage but ws told she did not suffer stroke.   She is still having issues intermittently and wants to be seen today. Pt speaks clearly in full sentences. Denies pain.   Placed her on DOD schedule for this morning with Dr. Kate Sable.

## 2020-04-26 NOTE — Telephone Encounter (Signed)
Returned the patients call. Patient was seen today for a DOD appt. She is a Dr. Rockey Situ pt. A zio monitor was placed and the pt was ref to neurology. Patient sts that the monitor was to heavy and made her very uncomfortable.  Patients states that she has already removed the zio monitor and has boxed it up to return it to [employer].    Advised the patient that Dr. Garen Lah was attempting to r/o AFIB as the cause of her recent stroke. Advised the patient that I have discussed with Dr. Garen Lah and that he feels that it is her decision. She does not want to wear the monitor.  Patient stst that she has already been contacted by [facility] and has been scheduled with their office.

## 2020-04-28 NOTE — Addendum Note (Signed)
Addended by: Britt Bottom on: 04/28/2020 07:37 AM   Modules accepted: Orders

## 2020-06-01 ENCOUNTER — Other Ambulatory Visit: Payer: Self-pay

## 2020-06-01 ENCOUNTER — Encounter (INDEPENDENT_AMBULATORY_CARE_PROVIDER_SITE_OTHER): Payer: Self-pay | Admitting: Vascular Surgery

## 2020-06-01 ENCOUNTER — Ambulatory Visit (INDEPENDENT_AMBULATORY_CARE_PROVIDER_SITE_OTHER): Payer: Medicare Other | Admitting: Vascular Surgery

## 2020-06-01 VITALS — BP 132/64 | HR 86 | Ht 64.0 in | Wt 212.0 lb

## 2020-06-01 DIAGNOSIS — E1159 Type 2 diabetes mellitus with other circulatory complications: Secondary | ICD-10-CM | POA: Diagnosis not present

## 2020-06-01 DIAGNOSIS — I10 Essential (primary) hypertension: Secondary | ICD-10-CM | POA: Diagnosis not present

## 2020-06-01 DIAGNOSIS — N185 Chronic kidney disease, stage 5: Secondary | ICD-10-CM

## 2020-06-01 DIAGNOSIS — I2510 Atherosclerotic heart disease of native coronary artery without angina pectoris: Secondary | ICD-10-CM | POA: Insufficient documentation

## 2020-06-01 DIAGNOSIS — E782 Mixed hyperlipidemia: Secondary | ICD-10-CM

## 2020-06-01 DIAGNOSIS — E079 Disorder of thyroid, unspecified: Secondary | ICD-10-CM | POA: Insufficient documentation

## 2020-06-01 DIAGNOSIS — I6529 Occlusion and stenosis of unspecified carotid artery: Secondary | ICD-10-CM | POA: Insufficient documentation

## 2020-06-01 DIAGNOSIS — I6523 Occlusion and stenosis of bilateral carotid arteries: Secondary | ICD-10-CM

## 2020-06-01 DIAGNOSIS — I639 Cerebral infarction, unspecified: Secondary | ICD-10-CM

## 2020-06-01 NOTE — Assessment & Plan Note (Signed)
Her most recent creatinine clearance was less than 15.  This significantly complicates the situation.  This would preclude any ability to get a CT angiogram which is commonly part of our work-up.  A catheter-based angiogram can be done with a limited amount of contrast and that is likely her best next step.  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

## 2020-06-01 NOTE — Patient Instructions (Signed)
Carotid Angioplasty With Stent, Care After This sheet gives you information about how to care for yourself after your procedure. Your doctor may also give you more specific instructions. If you have problems or questions, contact your doctor. What can I expect after the procedure? After the procedure, it is common to have:  Bruising at the site where the tube (catheter) was inserted. This usually goes away within 1-2 weeks.  A small amount of blood or clear fluid coming from your cut from surgery (incision).  A small amount of blood collecting under your skin (hematoma) around the tube site. This may form a lump that can be sore and tender. This usually lasts for 1-2 weeks. Follow these instructions at home: Medicines  Take over-the-counter and prescription medicines only as told by your doctor.  Your doctor may prescribe a medicine to thin your blood after the procedure. This will help to prevent blood clots.  If you were prescribed an antibiotic medicine, take it as told by your doctor. Do not stop taking the antibiotic even if you start to feel better.  Ask your doctor if the medicine prescribed to you requires you to avoid driving or using heavy machinery. Incision care   Keep your cut from surgery clean and dry.  Follow instructions from your doctor about how to take care of your cut from surgery. Make sure you: ? Wash your hands with soap and water before and after you change your bandage (dressing). If you cannot use soap and water, use hand sanitizer. ? Change your bandage as told by your doctor. ? Do not rub the site. This may cause bleeding. ? Leave stitches (sutures), skin glue, or skin tape (adhesive) strips in place. They may need to stay in place for 2 weeks or longer. If tape strips get loose and curl up, you may trim the loose edges. Do not remove tape strips completely unless your doctor says it is okay.  Check the area around your cut every day for signs of infection.  Check for: ? More redness, swelling, or pain. ? More fluid or blood. ? Warmth. ? Pus or a bad smell. Activity  Return to your normal activities as told by your doctor. Ask your doctor what activities are safe for you.  Do not lift anything that is heavier than 10 lb (4.5 kg), or the limit that you are told, until your doctor says that it is safe.  Avoid sex until your doctor says that this is safe for you. Eating and drinking   Follow instructions from your doctor about what you cannot eat or drink.  Drink enough fluid to keep your pee (urine) pale yellow.  Eat a heart-healthy diet. This includes foods like fresh fruits and vegetables, whole grains, low-fat dairy products, and low-fat (lean) meats. Avoid foods that are: ? High in salt, saturated fat, or sugar. ? Canned or highly processed. ? Fried. Lifestyle  If you drink alcohol: ? Limit how much you use to:  0-1 drink a day for women.  0-2 drinks a day for men. ? Be aware of how much alcohol is in your drink. In the [place] one drink equals one 12 oz bottle of beer (355 mL), one 5 oz glass of wine (148 mL), or one 1 oz glass of hard liquor (44 mL).  Do not use any products that contain nicotine or tobacco, such as cigarettes, e-cigarettes, and chewing tobacco. If you need help quitting, ask your doctor.  Work with your doctor  to keep your blood pressure under control.  Stay at a healthy weight. General instructions  Do not take baths, swim, or use a hot tub until your doctor approves.  Avoid straining to poop to keep the cut from bleeding.  Tell all doctors who provide care for you that you have a stent.  Keep all follow-up visits as told by your doctor. This is important. Contact a doctor if:  You have more redness, swelling, or pain around your cut.  You have more fluid or blood coming from your cut.  The area around your cut feels warm to the touch.  You have pus or a bad smell coming from your cut.  You  have a lump caused by bleeding under your skin, and the lump does not go away after 2 weeks.  You have a fever. Get help right away if:  You have a hard time breathing.  You have pain in your chest.  You have any signs of a stroke. You may be at risk for a stroke even after this procedure. You may be at greater risk of a stroke if you have diabetes, lung disease, kidney disease, had a previous stroke, or are 45 years of age or older. "BE FAST" is an easy way to remember the main warning signs: ? B - Balance. Signs are dizziness, sudden trouble walking, or loss of balance. ? E - Eyes. Signs are trouble seeing or a change in how you see. ? F - Face. Signs are sudden weakness or loss of feeling of the face, or the face or eyelid drooping on one side. ? A - Arms. Signs are weakness or loss of feeling in an arm. This happens suddenly and usually on one side of the body. ? S - Speech. Signs are sudden trouble speaking, slurred speech, or trouble understanding what people say. ? T - Time. Time to call emergency services. Write down what time symptoms started.  You have other signs of a stroke, such as: ? A sudden, very bad headache with no known cause. ? Feeling sick to your stomach (nausea). ? Vomiting. ? Jerky movements you cannot control (seizure).  You notice a lump caused by bleeding under your skin and the lump is quickly getting larger.  You suddenly get pain in the area where your stent was placed.  Your cut from surgery starts to bleed and does not stop after you hold pressure on it for a few minutes. These symptoms may be an emergency. Do not wait to see if the symptoms will go away. Get medical help right away. Call your local emergency services (911 in the [place] Do not drive yourself to the hospital. Summary  After the procedure, it is common to have bruising or a small amount of blood or fluid in the area where the surgery cut was made.  Take over-the-counter and prescription  medicines only as told by your doctor.  Return to your normal activities as told by your doctor. Ask your doctor what activities are safe for you.  Make sure you know which symptoms to watch for and when to get medical help right away. This information is not intended to replace advice given to you by your health care provider. Make sure you discuss any questions you have with your health care provider. Document Revised: 10/28/2018 Document Reviewed: 08/01/2018 Elsevier Patient Education  2020 [employer].

## 2020-06-01 NOTE — Assessment & Plan Note (Signed)
blood glucose control important in reducing the progression of atherosclerotic disease. Also, involved in wound healing. On appropriate medications.  

## 2020-06-01 NOTE — H&P (View-Only) (Signed)
Patient ID: [name], female   DOB: [birth year], 86 y.o.   MRN: [id number]  Chief Complaint  Patient presents with  . New Patient (Initial Visit)    establish care for carotid diease     HPI [name] is a 86 y.o. female.  I am asked to see the patient by Dr. Doy Hutching and Dr. Melrose Nakayama for evaluation of carotid disease.  I saw her many years ago but had not seen her for carotid disease in some time.  I have taken care of her husband and actually done both of his carotid arteries with endarterectomy over the years.  She has multiple ongoing issues and is actually scheduled to see her primary care physician tomorrow as her creatinine was 3.4 last month.  She is getting labs repeated by his office.  She has a remote history of stroke after her coronary bypass several years ago as well as a recent history of stroke with right arm weakness and headache.  Her arm weakness has largely resolved.  Her neurologist recently performed a carotid duplex that I have reviewed.  The official report is of mild right carotid stenosis but greater than 70% left carotid stenosis.  I would agree with the high-grade left carotid artery stenosis but her common carotid artery velocities were also high on the right suggesting a higher degree of stenosis in the right common carotid artery.  With these findings, she is referred for further evaluation and treatment.     Past Medical History:  Diagnosis Date  . Angina pectoris ([place]) 08/26/2012  . Arthritis   . Breast cancer ([place]) 2012   right breast  . Breast mass, right   . Cancer [facility])    breast cancer, right side 2012  . Chronic kidney disease    RENAL INSUFF  . Complication of anesthesia   . Coronary artery disease 08/22/2012   sees Dr Rockey Situ  . Diabetes ([place])   . Dyspnea    ON EXERTION  . Gastritis    hx of  . GERD (gastroesophageal reflux disease)   . Headache(784.0)    migraines  . History of breast cancer    39 treatments of radiation.  Negative chemo.  Marland Kitchen History of seasonal allergies   . Hyperlipemia   . Hypertension    sees Dr. Fulton Reek  . Hypothyroidism   . Macular degeneration    Bilateral  . Neuropathy   . Personal history of radiation therapy   . Pneumonia    hx of  . PONV (postoperative nausea and vomiting)   . 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 Stroke ([place])    2012  . Thyroid disease   . Vertigo     Past Surgical History:  Procedure Laterality Date  . ABDOMINAL HYSTERECTOMY    . APPENDECTOMY    . BACK SURGERY    . BREAST BIOPSY Right    2012 positive- [facility]  . BREAST LUMPECTOMY Right 2012   F/U radiation   . BREAST SURGERY    . CARDIAC CATHETERIZATION  2014  . CATARACT EXTRACTION W/PHACO Right 08/14/2017   Procedure: CATARACT EXTRACTION PHACO AND INTRAOCULAR LENS PLACEMENT (IOC);  Surgeon: Birder Robson, MD;  Location: [facility] ORS;  Service: Ophthalmology;  Laterality: Right;  [place] 00:43.0 AP% 17.1 CDE 7.37 Fluid Pack lot # [id number]  . CATARACT EXTRACTION W/PHACO Left 10/09/2017   Procedure:  CATARACT EXTRACTION PHACO AND INTRAOCULAR LENS PLACEMENT (IOC);  Surgeon: Birder Robson, MD;  Location: [facility] OR[facility];  Service: Ophthalmology;  Laterality: Left;  [place] 00:30.3 AP% 11.0 CDE 3.33 Fluid Pack Lot # [id number]  . CORONARY ARTERY BYPASS GRAFT  09/05/2012   Procedure: CORONARY ARTERY BYPASS GRAFTING (CABG);  Surgeon: Rexene Alberts, MD;  Location: [place];  Service: Open Heart Surgery;  Laterality: N/A;  CABG x four, using left internal mammary artery and bilateral greater saphenous vein harvested endoscopically  . INTRAOPERATIVE TRANSESOPHAGEAL ECHOCARDIOGRAM  09/05/2012   Procedure: INTRAOPERATIVE TRANSESOPHAGEAL ECHOCARDIOGRAM;  Surgeon: Rexene Alberts, MD;  Location: [place];  Service: Open Heart Surgery;  Laterality: N/A;  . KNEE SURGERY     Bilateral nerve block  . LAPAROSCOPIC NISSEN FUNDOPLICATION    . OVARIAN CYST REMOVAL        Family History  Problem Relation Age of Onset  . Heart disease Brother        CABG & stents  . Hyperlipidemia Brother   . Hypertension Brother   . Cancer Father   . Heart disease Mother   . Breast cancer Cousin   . Kidney disease Neg Hx      Social History   Tobacco Use  . Smoking status: Former Smoker    Types: Cigarettes    Quit date: 11/13/1988    Years since quitting: 31.5  . Smokeless tobacco: Never Used  Substance Use Topics  . Alcohol use: No  . Drug use: No    Allergies  Allergen Reactions  . Clonidine Other (See Comments) and Shortness Of Breath  . Ciprocinonide [Fluocinolone]     Feels crazy  . Darvocet [Propoxyphene N-Acetaminophen]     Difficulty breathing and swallowing  . Duratuss G [Guaifenesin]     Other reaction(s): Unknown  . Ivp Dye [Iodinated Diagnostic Agents]     Nausea & vomiting  . Ketek  [Telithromycin]     Other reaction(s): Unknown  . Levaquin  [Levofloxacin] Nausea Only  . Other     Reports all cholesterol medication  . Statins     Muscle aches    Current Outpatient Medications  Medication Sig Dispense Refill  . ALPRAZolam (XANAX) 0.5 MG tablet Take 0.5 mg daily as needed by mouth for anxiety.     Marland Kitchen aspirin 81 MG chewable tablet Chew 81 mg by mouth daily.    . butalbital-acetaminophen-caffeine (ESGIC) 50-325-40 MG tablet Take 1 tablet daily as needed by mouth for headache or migraine.    . Calcium Carbonate-Vitamin D (CALCIUM 600+D PO) Take 1 tablet daily by mouth.    . clopidogrel (PLAVIX) 75 MG tablet Take by mouth.    . Cyanocobalamin 1000 MCG SUBL Take by mouth.    . docusate sodium (COLACE) 250 MG capsule Take 250 mg daily by mouth.    . furosemide (LASIX) 40 MG tablet Take 40 mg daily as needed by mouth for fluid or edema.     . gabapentin (NEURONTIN) 100 MG capsule Take 100 mg by mouth 2 (two) times daily.    Marland Kitchen glimepiride (AMARYL) 4 MG tablet Take 4 mg by mouth 2 (two) times daily. If blood sugar is over 200 take 4  mgs twice daily for 30 days     . hydrALAZINE (APRESOLINE) 25 MG tablet Take 25 mg 3 (three) times daily by mouth.    Marland Kitchen HYDROcodone-acetaminophen (NORCO/VICODIN) 5-325 MG tablet Take 1-2 tablets by mouth every 4 (four) hours as needed for moderate pain. 30 tablet 0  .  levothyroxine (SYNTHROID, LEVOTHROID) 150 MCG tablet Take 150 mcg daily before breakfast by mouth.    . metoprolol (LOPRESSOR) 50 MG tablet Take 1 tablet (50 mg total) by mouth every 8 (eight) hours. 90 tablet 5  . Multiple Vitamins-Minerals (ICAPS AREDS 2) CAPS Take 1 capsule 2 (two) times daily by mouth.    . Naphazoline HCl (CLEAR EYES OP) Apply 1 drop daily as needed to eye (dry eyes).    . ondansetron (ZOFRAN ODT) 4 MG disintegrating tablet Take 1 tablet (4 mg total) by mouth every 8 (eight) hours as needed for nausea or vomiting. 20 tablet 0  . promethazine (PHENERGAN) 12.5 MG tablet Take 1 tablet (12.5 mg total) by mouth every 6 (six) hours as needed for nausea or vomiting. 30 tablet 0   No current facility-administered medications for this visit.      REVIEW OF SYSTEMS (Negative unless checked)  Constitutional: [] Weight loss  [] Fever  [] Chills Cardiac: [] Chest pain   [] Chest pressure   [] Palpitations   [] Shortness of breath when laying flat   [] Shortness of breath at rest   [x] Shortness of breath with exertion. Vascular:  [] Pain in legs with walking   [] Pain in legs at rest   [] Pain in legs when laying flat   [] Claudication   [] Pain in feet when walking  [] Pain in feet at rest  [] Pain in feet when laying flat   [] History of DVT   [] Phlebitis   [] Swelling in legs   [] Varicose veins   [] Non-healing ulcers Pulmonary:   [] Uses home oxygen   [] Productive cough   [] Hemoptysis   [] Wheeze  [] COPD   [] Asthma Neurologic:  [] Dizziness  [] Blackouts   [] Seizures   [x] History of stroke   [] History of TIA  [] Aphasia   [] Temporary blindness   [] Dysphagia   [] Weakness or numbness in arms   [] Weakness or numbness in legs Musculoskeletal:   [x] Arthritis   [] Joint swelling   [x] Joint pain   [] Low back pain Hematologic:  [x] Easy bruising  [] Easy bleeding   [] Hypercoagulable state   [] Anemic  [] Hepatitis Gastrointestinal:  [] Blood in stool   [] Vomiting blood  [x] Gastroesophageal reflux/heartburn   [] Abdominal pain Genitourinary:  [x] Chronic kidney disease   [] Difficult urination  [] Frequent urination  [] Burning with urination   [] Hematuria Skin:  [] Rashes   [] Ulcers   [] Wounds Psychological:  [] History of anxiety   []  History of major depression.    Physical Exam BP 132/64   Pulse 86   Ht 5\' 4"  (1.626 m)   Wt 212 lb (96.2 kg)   BMI 36.39 kg/m  Gen:  WD/WN, NAD Head: [place]/AT, No temporalis wasting.  Ear/Nose/Throat: Hearing grossly intact, nares w/o erythema or drainage, oropharynx w/o Erythema/Exudate Eyes: Conjunctiva clear, sclera non-icteric  Neck: trachea midline.  No JVD.  Pulmonary:  Good air movement, respirations not labored, no use of accessory muscles  Cardiac: RRR, no JVD Vascular:  Vessel Right Left  Radial Palpable Palpable                                   Gastrointestinal:. No masses, surgical incisions, or scars. Musculoskeletal: M/S 5/5 throughout.  Extremities without ischemic changes.  No deformity or atrophy.  Mild lower extremity edema. Neurologic: Sensation grossly intact in extremities.  Symmetrical.  Speech is fluent. Motor exam as listed above. Psychiatric: Judgment intact, Mood & affect appropriate for pt's clinical situation. Dermatologic: No rashes or ulcers noted.  No  cellulitis or open wounds.  Diffuse bruising is present.    Radiology No results found.  Labs Recent Results (from the past 2160 hour(s))  Protime-INR     Status: None   Collection Time: 04/23/20  2:38 PM  Result Value Ref Range   Prothrombin Time 12.2 11.4 - 15.2 seconds   INR 0.9 0.8 - 1.2    Comment: (NOTE) INR goal varies based on device and disease states. Performed at [facility], [address] [place], [place] [address]   APTT     Status: None   Collection Time: 04/23/20  2:38 PM  Result Value Ref Range   aPTT 36 24 - 36 seconds    Comment: Performed at [facility], [address] [place], [place] [address]  CBC     Status: Abnormal   Collection Time: 04/23/20  2:38 PM  Result Value Ref Range   WBC 7.6 4.0 - 10.5 K/uL   RBC 3.78 (L) 3.87 - 5.11 MIL/uL   Hemoglobin 10.7 (L) 12.0 - 15.0 g/dL   HCT 33.1 (L) 36 - 46 %   MCV 87.6 80.0 - 100.0 fL   MCH 28.3 26.0 - 34.0 pg   MCHC 32.3 30.0 - 36.0 g/dL   RDW 15.0 11.5 - 15.5 %   Platelets 171 150 - 400 K/uL   nRBC 0.0 0.0 - 0.2 %    Comment: Performed at [facility], [address] [place], [place] [address]  Differential     Status: Abnormal   Collection Time: 04/23/20  2:38 PM  Result Value Ref Range   Neutrophils Relative % 75 %   Neutro Abs 5.7 1.7 - 7.7 K/uL   Lymphocytes Relative 12 %   Lymphs Abs 0.9 0.7 - 4.0 K/uL   Monocytes Relative 7 %   Monocytes Absolute 0.5 0 - 1 K/uL   Eosinophils Relative 3 %   Eosinophils Absolute 0.2 0 - 0 K/uL   Basophils Relative 1 %   Basophils Absolute 0.1 0 - 0 K/uL   Immature Granulocytes 2 %   Abs Immature Granulocytes 0.16 (H) 0.00 - 0.07 K/uL    Comment: Performed at [facility], [address] [place], [place] [address]  Comprehensive metabolic panel     Status: Abnormal   Collection Time: 04/23/20  2:38 PM  Result Value Ref Range   Sodium 137 135 - 145 mmol/L   Potassium 4.2 3.5 - 5.1 mmol/L   Chloride 102 98 - 111 mmol/L   CO2 23 22 - 32 mmol/L   Glucose, Bld 132 (H) 70 - 99 mg/dL    Comment: Glucose reference range applies only to samples taken after fasting for at least 8 hours.   BUN 45 (H) 8 - 23 mg/dL   Creatinine, Ser 3.40 (H) 0.44 - 1.00 mg/dL   Calcium 8.3 (L) 8.9 - 10.3 mg/dL   Total Protein 8.5 (H) 6.5 - 8.1 g/dL   Albumin 3.3 (L) 3.5 - 5.0 g/dL   AST 11 (L) 15 - 41 U/L   ALT 8 0 - 44 U/L   Alkaline Phosphatase 81  38 - 126 U/L   Total Bilirubin 0.8 0.3 - 1.2 mg/dL   GFR calc non Af Amer 12 (L) >60 mL/min   GFR calc Af Amer 14 (L) >60 mL/min   Anion gap 12 5 - 15    Comment: Performed at [facility], [address], [place], [place] [address]  Assessment/Plan:  HTN, goal below 140/80 blood pressure control important in reducing the progression of atherosclerotic disease. On appropriate oral medications.   Stroke [facility]) The patient has 2 previous histories of stroke both affecting her right arm.  This would correlate with a left carotid stenosis.  With a high-grade left carotid artery stenosis present and previous history of stroke, intervention is likely to benefit but she has multiple other ongoing issues.  Diabetes mellitus ([place]) blood glucose control important in reducing the progression of atherosclerotic disease. Also, involved in wound healing. On appropriate medications.   Hyperlipidemia lipid control important in reducing the progression of atherosclerotic disease. Continue statin therapy   CKD (chronic kidney disease) stage 5, GFR less than 15 ml/min (HCC) Her most recent creatinine clearance was less than 15.  This significantly complicates the situation.  This would preclude any ability to get a CT angiogram which is commonly part of our work-up.  A catheter-based angiogram can be done with a limited amount of contrast and that is likely her best next step.  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  Carotid stenosis  Her neurologist recently performed a carotid duplex that I have reviewed.  The official report is of mild right carotid stenosis but greater than 70% left carotid stenosis.  I would agree with the high-grade left carotid artery stenosis but her common carotid artery velocities were also high on the right  suggesting a higher degree of stenosis in the right common carotid artery. This is a very complex and difficult situation, but with a high-grade lesion that is symptomatic on the left side, would generally benefit from intervention.  Given her multiple ongoing issues particularly her renal issues a CT angiogram cannot be done as the next step which is often the case.  At this point, consideration for a catheter-based angiogram with possible left carotid stent if this can be done with a limited amount of contrast and the anatomy is suitable for stent placement would be a good idea.  She is already on Plavix and a statin agent.  I sent a message to her primary care physician to get his input on this as well and he is actually scheduled to see her later this week.  Repeating her lab work is also reasonable to see if her renal function has significantly improved.  I discussed the risks and benefits of the procedure.  She and her husband are in agreement and desire to proceed.      [name] 06/01/2020, 10:56 AM   This note was created with Dragon medical transcription system.  Any errors from dictation are unintentional.

## 2020-06-01 NOTE — Assessment & Plan Note (Signed)
blood pressure control important in reducing the progression of atherosclerotic disease. On appropriate oral medications.  

## 2020-06-01 NOTE — Assessment & Plan Note (Signed)
Her neurologist recently performed a carotid duplex that I have reviewed.  The official report is of mild right carotid stenosis but greater than 70% left carotid stenosis.  I would agree with the high-grade left carotid artery stenosis but her common carotid artery velocities were also high on the right suggesting a higher degree of stenosis in the right common carotid artery. This is a very complex and difficult situation, but with a high-grade lesion that is symptomatic on the left side, would generally benefit from intervention.  Given her multiple ongoing issues particularly her renal issues a CT angiogram cannot be done as the next step which is often the case.  At this point, consideration for a catheter-based angiogram with possible left carotid stent if this can be done with a limited amount of contrast and the anatomy is suitable for stent placement would be a good idea.  She is already on Plavix and a statin agent.  I sent a message to her primary care physician to get his input on this as well and he is actually scheduled to see her later this week.  Repeating her lab work is also reasonable to see if her renal function has significantly improved.  I discussed the risks and benefits of the procedure.  She and her husband are in agreement and desire to proceed.

## 2020-06-01 NOTE — Progress Notes (Signed)
Patient ID: [name], female   DOB: 1934-03-23, 86 y.o.   MRN: [id number]  Chief Complaint  Patient presents with  . New Patient (Initial Visit)    establish care for carotid diease     HPI [name] is a 86 y.o. female.  I am asked to see the patient by Dr. Doy Hutching and Dr. Melrose Nakayama for evaluation of carotid disease.  I saw her many years ago but had not seen her for carotid disease in some time.  I have taken care of her husband and actually done both of his carotid arteries with endarterectomy over the years.  She has multiple ongoing issues and is actually scheduled to see her primary care physician tomorrow as her creatinine was 3.4 last month.  She is getting labs repeated by his office.  She has a remote history of stroke after her coronary bypass several years ago as well as a recent history of stroke with right arm weakness and headache.  Her arm weakness has largely resolved.  Her neurologist recently performed a carotid duplex that I have reviewed.  The official report is of mild right carotid stenosis but greater than 70% left carotid stenosis.  I would agree with the high-grade left carotid artery stenosis but her common carotid artery velocities were also high on the right suggesting a higher degree of stenosis in the right common carotid artery.  With these findings, she is referred for further evaluation and treatment.     Past Medical History:  Diagnosis Date  . Angina pectoris ([place]) 08/26/2012  . Arthritis   . Breast cancer ([place]) 2012   right breast  . Breast mass, right   . Cancer [facility])    breast cancer, right side 2012  . Chronic kidney disease    RENAL INSUFF  . Complication of anesthesia   . Coronary artery disease 08/22/2012   sees Dr Rockey Situ  . Diabetes ([place])   . Dyspnea    ON EXERTION  . Gastritis    hx of  . GERD (gastroesophageal reflux disease)   . Headache(784.0)    migraines  . History of breast cancer    39 treatments of radiation.  Negative chemo.  Marland Kitchen History of seasonal allergies   . Hyperlipemia   . Hypertension    sees Dr. Fulton Reek  . Hypothyroidism   . Macular degeneration    Bilateral  . Neuropathy   . Personal history of radiation therapy   . Pneumonia    hx of  . PONV (postoperative nausea and vomiting)   . 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 Stroke ([place])    2012  . Thyroid disease   . Vertigo     Past Surgical History:  Procedure Laterality Date  . ABDOMINAL HYSTERECTOMY    . APPENDECTOMY    . BACK SURGERY    . BREAST BIOPSY Right    2012 positive- [facility]  . BREAST LUMPECTOMY Right 2012   F/U radiation   . BREAST SURGERY    . CARDIAC CATHETERIZATION  2014  . CATARACT EXTRACTION W/PHACO Right 08/14/2017   Procedure: CATARACT EXTRACTION PHACO AND INTRAOCULAR LENS PLACEMENT (IOC);  Surgeon: Birder Robson, MD;  Location: [facility] ORS;  Service: Ophthalmology;  Laterality: Right;  [place] 00:43.0 AP% 17.1 CDE 7.37 Fluid Pack lot # [id number]  . CATARACT EXTRACTION W/PHACO Left 10/09/2017   Procedure:  CATARACT EXTRACTION PHACO AND INTRAOCULAR LENS PLACEMENT (IOC);  Surgeon: Birder Robson, MD;  Location: [facility] ORS;  Service: Ophthalmology;  Laterality: Left;  [place] 00:30.3 AP% 11.0 CDE 3.33 Fluid Pack Lot # [id number]  . CORONARY ARTERY BYPASS GRAFT  09/05/2012   Procedure: CORONARY ARTERY BYPASS GRAFTING (CABG);  Surgeon: Rexene Alberts, MD;  Location: [place];  Service: Open Heart Surgery;  Laterality: N/A;  CABG x four, using left internal mammary artery and bilateral greater saphenous vein harvested endoscopically  . INTRAOPERATIVE TRANSESOPHAGEAL ECHOCARDIOGRAM  09/05/2012   Procedure: INTRAOPERATIVE TRANSESOPHAGEAL ECHOCARDIOGRAM;  Surgeon: Rexene Alberts, MD;  Location: [place];  Service: Open Heart Surgery;  Laterality: N/A;  . KNEE SURGERY     Bilateral nerve block  . LAPAROSCOPIC NISSEN FUNDOPLICATION    . OVARIAN CYST REMOVAL         Family History  Problem Relation Age of Onset  . Heart disease Brother        CABG & stents  . Hyperlipidemia Brother   . Hypertension Brother   . Cancer Father   . Heart disease Mother   . Breast cancer Cousin   . Kidney disease Neg Hx      Social History   Tobacco Use  . Smoking status: Former Smoker    Types: Cigarettes    Quit date: 11/13/1988    Years since quitting: 31.5  . Smokeless tobacco: Never Used  Substance Use Topics  . Alcohol use: No  . Drug use: No    Allergies  Allergen Reactions  . Clonidine Other (See Comments) and Shortness Of Breath  . Ciprocinonide [Fluocinolone]     Feels crazy  . Darvocet [Propoxyphene N-Acetaminophen]     Difficulty breathing and swallowing  . Duratuss G [Guaifenesin]     Other reaction(s): Unknown  . Ivp Dye [Iodinated Diagnostic Agents]     Nausea & vomiting  . Ketek  [Telithromycin]     Other reaction(s): Unknown  . Levaquin  [Levofloxacin] Nausea Only  . Other     Reports all cholesterol medication  . Statins     Muscle aches    Current Outpatient Medications  Medication Sig Dispense Refill  . ALPRAZolam (XANAX) 0.5 MG tablet Take 0.5 mg daily as needed by mouth for anxiety.     Marland Kitchen aspirin 81 MG chewable tablet Chew 81 mg by mouth daily.    . butalbital-acetaminophen-caffeine (ESGIC) 50-325-40 MG tablet Take 1 tablet daily as needed by mouth for headache or migraine.    . Calcium Carbonate-Vitamin D (CALCIUM 600+D PO) Take 1 tablet daily by mouth.    . clopidogrel (PLAVIX) 75 MG tablet Take by mouth.    . Cyanocobalamin 1000 MCG SUBL Take by mouth.    . docusate sodium (COLACE) 250 MG capsule Take 250 mg daily by mouth.    . furosemide (LASIX) 40 MG tablet Take 40 mg daily as needed by mouth for fluid or edema.     . gabapentin (NEURONTIN) 100 MG capsule Take 100 mg by mouth 2 (two) times daily.    Marland Kitchen glimepiride (AMARYL) 4 MG tablet Take 4 mg by mouth 2 (two) times daily. If blood sugar is over 200 take 4  mgs twice daily for 30 days     . hydrALAZINE (APRESOLINE) 25 MG tablet Take 25 mg 3 (three) times daily by mouth.    Marland Kitchen HYDROcodone-acetaminophen (NORCO/VICODIN) 5-325 MG tablet Take 1-2 tablets by mouth every 4 (four) hours as needed for moderate pain. 30 tablet  0  . levothyroxine (SYNTHROID, LEVOTHROID) 150 MCG tablet Take 150 mcg daily before breakfast by mouth.    . metoprolol (LOPRESSOR) 50 MG tablet Take 1 tablet (50 mg total) by mouth every 8 (eight) hours. 90 tablet 5  . Multiple Vitamins-Minerals (ICAPS AREDS 2) CAPS Take 1 capsule 2 (two) times daily by mouth.    . Naphazoline HCl (CLEAR EYES OP) Apply 1 drop daily as needed to eye (dry eyes).    . ondansetron (ZOFRAN ODT) 4 MG disintegrating tablet Take 1 tablet (4 mg total) by mouth every 8 (eight) hours as needed for nausea or vomiting. 20 tablet 0  . promethazine (PHENERGAN) 12.5 MG tablet Take 1 tablet (12.5 mg total) by mouth every 6 (six) hours as needed for nausea or vomiting. 30 tablet 0   No current facility-administered medications for this visit.      REVIEW OF SYSTEMS (Negative unless checked)  Constitutional: [] Weight loss  [] Fever  [] Chills Cardiac: [] Chest pain   [] Chest pressure   [] Palpitations   [] Shortness of breath when laying flat   [] Shortness of breath at rest   [x] Shortness of breath with exertion. Vascular:  [] Pain in legs with walking   [] Pain in legs at rest   [] Pain in legs when laying flat   [] Claudication   [] Pain in feet when walking  [] Pain in feet at rest  [] Pain in feet when laying flat   [] History of DVT   [] Phlebitis   [] Swelling in legs   [] Varicose veins   [] Non-healing ulcers Pulmonary:   [] Uses home oxygen   [] Productive cough   [] Hemoptysis   [] Wheeze  [] COPD   [] Asthma Neurologic:  [] Dizziness  [] Blackouts   [] Seizures   [x] History of stroke   [] History of TIA  [] Aphasia   [] Temporary blindness   [] Dysphagia   [] Weakness or numbness in arms   [] Weakness or numbness in legs Musculoskeletal:   [x] Arthritis   [] Joint swelling   [x] Joint pain   [] Low back pain Hematologic:  [x] Easy bruising  [] Easy bleeding   [] Hypercoagulable state   [] Anemic  [] Hepatitis Gastrointestinal:  [] Blood in stool   [] Vomiting blood  [x] Gastroesophageal reflux/heartburn   [] Abdominal pain Genitourinary:  [x] Chronic kidney disease   [] Difficult urination  [] Frequent urination  [] Burning with urination   [] Hematuria Skin:  [] Rashes   [] Ulcers   [] Wounds Psychological:  [] History of anxiety   []  History of major depression.    Physical Exam BP 132/64   Pulse 86   Ht 5\' 4"  (1.626 m)   Wt 212 lb (96.2 kg)   BMI 36.39 kg/m  Gen:  WD/WN, NAD Head: /AT, No temporalis wasting.  Ear/Nose/Throat: Hearing grossly intact, nares w/o erythema or drainage, oropharynx w/o Erythema/Exudate Eyes: Conjunctiva clear, sclera non-icteric  Neck: trachea midline.  No JVD.  Pulmonary:  Good air movement, respirations not labored, no use of accessory muscles  Cardiac: RRR, no JVD Vascular:  Vessel Right Left  Radial Palpable Palpable                                   Gastrointestinal:. No masses, surgical incisions, or scars. Musculoskeletal: M/S 5/5 throughout.  Extremities without ischemic changes.  No deformity or atrophy.  Mild lower extremity edema. Neurologic: Sensation grossly intact in extremities.  Symmetrical.  Speech is fluent. Motor exam as listed above. Psychiatric: Judgment intact, Mood & affect appropriate for pt's clinical situation. Dermatologic: No rashes or ulcers  noted.  No cellulitis or open wounds.  Diffuse bruising is present.    Radiology No results found.  Labs Recent Results (from the past 2160 hour(s))  Protime-INR     Status: None   Collection Time: 04/23/20  2:38 PM  Result Value Ref Range   Prothrombin Time 12.2 11.4 - 15.2 seconds   INR 0.9 0.8 - 1.2    Comment: (NOTE) INR goal varies based on device and disease states. Performed at [facility], [address] [place], [place] [address]   APTT     Status: None   Collection Time: 04/23/20  2:38 PM  Result Value Ref Range   aPTT 36 24 - 36 seconds    Comment: Performed at [facility], [address], [place] [address]  CBC     Status: Abnormal   Collection Time: 04/23/20  2:38 PM  Result Value Ref Range   WBC 7.6 4.0 - 10.5 K/uL   RBC 3.78 (L) 3.87 - 5.11 MIL/uL   Hemoglobin 10.7 (L) 12.0 - 15.0 g/dL   HCT 33.1 (L) 36 - 46 %   MCV 87.6 80.0 - 100.0 fL   MCH 28.3 26.0 - 34.0 pg   MCHC 32.3 30.0 - 36.0 g/dL   RDW 15.0 11.5 - 15.5 %   Platelets 171 150 - 400 K/uL   nRBC 0.0 0.0 - 0.2 %    Comment: Performed at [facility], [address] [place], [place] [address]  Differential     Status: Abnormal   Collection Time: 04/23/20  2:38 PM  Result Value Ref Range   Neutrophils Relative % 75 %   Neutro Abs 5.7 1.7 - 7.7 K/uL   Lymphocytes Relative 12 %   Lymphs Abs 0.9 0.7 - 4.0 K/uL   Monocytes Relative 7 %   Monocytes Absolute 0.5 0 - 1 K/uL   Eosinophils Relative 3 %   Eosinophils Absolute 0.2 0 - 0 K/uL   Basophils Relative 1 %   Basophils Absolute 0.1 0 - 0 K/uL   Immature Granulocytes 2 %   Abs Immature Granulocytes 0.16 (H) 0.00 - 0.07 K/uL    Comment: Performed at [facility], [address] [place], [place] [address]  Comprehensive metabolic panel     Status: Abnormal   Collection Time: 04/23/20  2:38 PM  Result Value Ref Range   Sodium 137 135 - 145 mmol/L   Potassium 4.2 3.5 - 5.1 mmol/L   Chloride 102 98 - 111 mmol/L   CO2 23 22 - 32 mmol/L   Glucose, Bld 132 (H) 70 - 99 mg/dL    Comment: Glucose reference range applies only to samples taken after fasting for at least 8 hours.   BUN 45 (H) 8 - 23 mg/dL   Creatinine, Ser 3.40 (H) 0.44 - 1.00 mg/dL   Calcium 8.3 (L) 8.9 - 10.3 mg/dL   Total Protein 8.5 (H) 6.5 - 8.1 g/dL   Albumin 3.3 (L) 3.5 - 5.0 g/dL   AST 11 (L) 15 - 41 U/L   ALT 8 0 - 44 U/L   Alkaline Phosphatase 81  38 - 126 U/L   Total Bilirubin 0.8 0.3 - 1.2 mg/dL   GFR calc non Af Amer 12 (L) >60 mL/min   GFR calc Af Amer 14 (L) >60 mL/min   Anion gap 12 5 - 15    Comment: Performed at [facility], [address], [place], [place] [address]  Assessment/Plan:  HTN, goal below 140/80 blood pressure control important in reducing the progression of atherosclerotic disease. On appropriate oral medications.   Stroke [facility]) The patient has 2 previous histories of stroke both affecting her right arm.  This would correlate with a left carotid stenosis.  With a high-grade left carotid artery stenosis present and previous history of stroke, intervention is likely to benefit but she has multiple other ongoing issues.  Diabetes mellitus ([place]) blood glucose control important in reducing the progression of atherosclerotic disease. Also, involved in wound healing. On appropriate medications.   Hyperlipidemia lipid control important in reducing the progression of atherosclerotic disease. Continue statin therapy   CKD (chronic kidney disease) stage 5, GFR less than 15 ml/min (HCC) Her most recent creatinine clearance was less than 15.  This significantly complicates the situation.  This would preclude any ability to get a CT angiogram which is commonly part of our work-up.  A catheter-based angiogram can be done with a limited amount of contrast and that is likely her best next step.  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  Carotid stenosis  Her neurologist recently performed a carotid duplex that I have reviewed.  The official report is of mild right carotid stenosis but greater than 70% left carotid stenosis.  I would agree with the high-grade left carotid artery stenosis but her common carotid artery velocities were also high on the right  suggesting a higher degree of stenosis in the right common carotid artery. This is a very complex and difficult situation, but with a high-grade lesion that is symptomatic on the left side, would generally benefit from intervention.  Given her multiple ongoing issues particularly her renal issues a CT angiogram cannot be done as the next step which is often the case.  At this point, consideration for a catheter-based angiogram with possible left carotid stent if this can be done with a limited amount of contrast and the anatomy is suitable for stent placement would be a good idea.  She is already on Plavix and a statin agent.  I sent a message to her primary care physician to get his input on this as well and he is actually scheduled to see her later this week.  Repeating her lab work is also reasonable to see if her renal function has significantly improved.  I discussed the risks and benefits of the procedure.  She and her husband are in agreement and desire to proceed.      [name] 06/01/2020, 10:56 AM   This note was created with Dragon medical transcription system.  Any errors from dictation are unintentional.

## 2020-06-01 NOTE — Assessment & Plan Note (Signed)
lipid control important in reducing the progression of atherosclerotic disease. Continue statin therapy  

## 2020-06-01 NOTE — Assessment & Plan Note (Signed)
The patient has 2 previous histories of stroke both affecting her right arm.  This would correlate with a left carotid stenosis.  With a high-grade left carotid artery stenosis present and previous history of stroke, intervention is likely to benefit but she has multiple other ongoing issues.

## 2020-06-02 ENCOUNTER — Telehealth (INDEPENDENT_AMBULATORY_CARE_PROVIDER_SITE_OTHER): Payer: Self-pay

## 2020-06-02 NOTE — Telephone Encounter (Signed)
Spoke with the patient and she is scheduled with Dr. Lucky Cowboy for a left carotid stent placement on 06/11/20 with a 11:00 am arrival time to the [facility]. Covid testing on 06/09/20 between 8-1 pm at the [place]. Pre-procedure instructions were discussed and will be mailed.

## 2020-06-03 ENCOUNTER — Other Ambulatory Visit: Payer: Self-pay | Admitting: Internal Medicine

## 2020-06-03 DIAGNOSIS — N179 Acute kidney failure, unspecified: Secondary | ICD-10-CM

## 2020-06-04 NOTE — Telephone Encounter (Signed)
Spoke with the patient and she has been rescheduled from 06/11/20 to 06/14/20 with a 8:45 am arrival time to the [facility]. Covid testing has been moved to 06/10/20 and a new instructions will be mailed.

## 2020-06-09 ENCOUNTER — Other Ambulatory Visit: Payer: Medicare Other

## 2020-06-10 ENCOUNTER — Other Ambulatory Visit
Admission: RE | Admit: 2020-06-10 | Discharge: 2020-06-10 | Disposition: A | Discharge status: Home / Self Care | Payer: Medicare Other | Source: Ambulatory Visit | Attending: Vascular Surgery | Admitting: Vascular Surgery

## 2020-06-10 ENCOUNTER — Ambulatory Visit
Admission: RE | Admit: 2020-06-10 | Discharge: 2020-06-10 | Disposition: A | Discharge status: Home / Self Care | Payer: Medicare Other | Source: Ambulatory Visit | Attending: Internal Medicine | Admitting: Internal Medicine

## 2020-06-10 ENCOUNTER — Other Ambulatory Visit: Payer: Self-pay

## 2020-06-10 ENCOUNTER — Other Ambulatory Visit (INDEPENDENT_AMBULATORY_CARE_PROVIDER_SITE_OTHER): Payer: Self-pay | Admitting: Nurse Practitioner

## 2020-06-10 ENCOUNTER — Ambulatory Visit: Payer: Medicare Other | Admitting: Cardiology

## 2020-06-10 DIAGNOSIS — N179 Acute kidney failure, unspecified: Secondary | ICD-10-CM | POA: Diagnosis present

## 2020-06-10 DIAGNOSIS — Z20822 Contact with and (suspected) exposure to covid-19: Secondary | ICD-10-CM | POA: Diagnosis not present

## 2020-06-10 NOTE — Telephone Encounter (Signed)
Spoke with the patient she was wanted to let me know she has had her covid test and asked about her Plavix and taking it or not. I advised that she can take all medications with small sips of water and we went over her pre-procedural instructions as well.

## 2020-06-11 LAB — SARS CORONAVIRUS 2 (TAT 6-24 HRS): SARS Coronavirus 2: NEGATIVE

## 2020-06-14 ENCOUNTER — Ambulatory Visit
Admission: RE | Admit: 2020-06-14 | Discharge: 2020-06-14 | Disposition: A | Discharge status: Home / Self Care | Payer: Medicare Other | Attending: Vascular Surgery | Admitting: Vascular Surgery

## 2020-06-14 ENCOUNTER — Other Ambulatory Visit (INDEPENDENT_AMBULATORY_CARE_PROVIDER_SITE_OTHER): Payer: Self-pay | Admitting: Nurse Practitioner

## 2020-06-14 ENCOUNTER — Other Ambulatory Visit: Payer: Self-pay

## 2020-06-14 ENCOUNTER — Encounter: Payer: Self-pay | Admitting: Vascular Surgery

## 2020-06-14 ENCOUNTER — Encounter
Admission: RE | Disposition: A | Discharge status: Home / Self Care | Payer: Self-pay | Source: Home / Self Care | Attending: Vascular Surgery

## 2020-06-14 DIAGNOSIS — Z87891 Personal history of nicotine dependence: Secondary | ICD-10-CM | POA: Insufficient documentation

## 2020-06-14 DIAGNOSIS — Z923 Personal history of irradiation: Secondary | ICD-10-CM | POA: Diagnosis not present

## 2020-06-14 DIAGNOSIS — Z888 Allergy status to other drugs, medicaments and biological substances status: Secondary | ICD-10-CM | POA: Diagnosis not present

## 2020-06-14 DIAGNOSIS — Z7982 Long term (current) use of aspirin: Secondary | ICD-10-CM | POA: Diagnosis not present

## 2020-06-14 DIAGNOSIS — Z91041 Radiographic dye allergy status: Secondary | ICD-10-CM | POA: Diagnosis not present

## 2020-06-14 DIAGNOSIS — Z881 Allergy status to other antibiotic agents status: Secondary | ICD-10-CM | POA: Diagnosis not present

## 2020-06-14 DIAGNOSIS — N189 Chronic kidney disease, unspecified: Secondary | ICD-10-CM | POA: Insufficient documentation

## 2020-06-14 DIAGNOSIS — Z951 Presence of aortocoronary bypass graft: Secondary | ICD-10-CM | POA: Insufficient documentation

## 2020-06-14 DIAGNOSIS — E785 Hyperlipidemia, unspecified: Secondary | ICD-10-CM | POA: Insufficient documentation

## 2020-06-14 DIAGNOSIS — E039 Hypothyroidism, unspecified: Secondary | ICD-10-CM | POA: Diagnosis not present

## 2020-06-14 DIAGNOSIS — Z7984 Long term (current) use of oral hypoglycemic drugs: Secondary | ICD-10-CM | POA: Insufficient documentation

## 2020-06-14 DIAGNOSIS — E1140 Type 2 diabetes mellitus with diabetic neuropathy, unspecified: Secondary | ICD-10-CM | POA: Insufficient documentation

## 2020-06-14 DIAGNOSIS — I25119 Atherosclerotic heart disease of native coronary artery with unspecified angina pectoris: Secondary | ICD-10-CM | POA: Diagnosis not present

## 2020-06-14 DIAGNOSIS — R0609 Other forms of dyspnea: Secondary | ICD-10-CM | POA: Insufficient documentation

## 2020-06-14 DIAGNOSIS — Z79899 Other long term (current) drug therapy: Secondary | ICD-10-CM | POA: Insufficient documentation

## 2020-06-14 DIAGNOSIS — K219 Gastro-esophageal reflux disease without esophagitis: Secondary | ICD-10-CM | POA: Insufficient documentation

## 2020-06-14 DIAGNOSIS — I120 Hypertensive chronic kidney disease with stage 5 chronic kidney disease or end stage renal disease: Secondary | ICD-10-CM | POA: Diagnosis not present

## 2020-06-14 DIAGNOSIS — Z79890 Hormone replacement therapy (postmenopausal): Secondary | ICD-10-CM | POA: Insufficient documentation

## 2020-06-14 DIAGNOSIS — Z853 Personal history of malignant neoplasm of breast: Secondary | ICD-10-CM | POA: Insufficient documentation

## 2020-06-14 DIAGNOSIS — I6522 Occlusion and stenosis of left carotid artery: Secondary | ICD-10-CM | POA: Diagnosis present

## 2020-06-14 DIAGNOSIS — Z8673 Personal history of transient ischemic attack (TIA), and cerebral infarction without residual deficits: Secondary | ICD-10-CM | POA: Insufficient documentation

## 2020-06-14 HISTORY — PX: CAROTID PTA/STENT INTERVENTION: CATH118231

## 2020-06-14 LAB — CREATININE, SERUM
Creatinine, Ser: 4.49 mg/dL — ABNORMAL HIGH (ref 0.44–1.00)
GFR calc Af Amer: 10 mL/min — ABNORMAL LOW (ref >60)
GFR calc non Af Amer: 8 mL/min — ABNORMAL LOW (ref >60)

## 2020-06-14 LAB — GLUCOSE, CAPILLARY
Glucose-Capillary: 60 mg/dL — ABNORMAL LOW (ref 70–99)
Glucose-Capillary: 126 mg/dL — ABNORMAL HIGH (ref 70–99)

## 2020-06-14 LAB — BUN: BUN: 57 mg/dL — ABNORMAL HIGH (ref 8–23)

## 2020-06-14 SURGERY — CAROTID PTA/STENT INTERVENTION
Anesthesia: Moderate Sedation | Laterality: Left

## 2020-06-14 MED ORDER — DIPHENHYDRAMINE HCL 50 MG/ML IJ SOLN: 50.0000 mg | Freq: Once | INTRAMUSCULAR | Status: DC | PRN

## 2020-06-14 MED ORDER — METHYLPREDNISOLONE SODIUM SUCC 125 MG IJ SOLR: 125.0000 mg | Freq: Once | INTRAMUSCULAR | Status: AC | PRN

## 2020-06-14 MED ORDER — METHYLPREDNISOLONE SODIUM SUCC 125 MG IJ SOLR
INTRAMUSCULAR | Status: AC
  Administered 2020-06-14: 125 mg via INTRAVENOUS
  Filled 2020-06-14: qty 2

## 2020-06-14 MED ORDER — DIPHENHYDRAMINE HCL 50 MG/ML IJ SOLN
INTRAMUSCULAR | Status: DC | PRN
  Administered 2020-06-14: 50 mg via INTRAVENOUS

## 2020-06-14 MED ORDER — PHENYLEPHRINE HCL (PRESSORS) 10 MG/ML IV SOLN
INTRAVENOUS | Status: AC
  Filled 2020-06-14: qty 1

## 2020-06-14 MED ORDER — MIDAZOLAM HCL 2 MG/2ML IJ SOLN
INTRAMUSCULAR | Status: DC | PRN
  Administered 2020-06-14: 1 mg via INTRAVENOUS

## 2020-06-14 MED ORDER — SODIUM CHLORIDE 0.9 % IV SOLN: INTRAVENOUS | Status: DC

## 2020-06-14 MED ORDER — MIDAZOLAM HCL 5 MG/5ML IJ SOLN
INTRAMUSCULAR | Status: AC
  Filled 2020-06-14: qty 5

## 2020-06-14 MED ORDER — CEFAZOLIN SODIUM-DEXTROSE 2-4 GM/100ML-% IV SOLN
2.0000 g | Freq: Once | INTRAVENOUS | Status: AC
  Administered 2020-06-14: 2 g via INTRAVENOUS

## 2020-06-14 MED ORDER — IODIXANOL 320 MG/ML IV SOLN
INTRAVENOUS | Status: DC | PRN
  Administered 2020-06-14: 25 mL via INTRA_ARTERIAL

## 2020-06-14 MED ORDER — CEFAZOLIN SODIUM-DEXTROSE 2-4 GM/100ML-% IV SOLN
INTRAVENOUS | Status: AC
  Filled 2020-06-14: qty 100

## 2020-06-14 MED ORDER — DOPAMINE-DEXTROSE 3.2-5 MG/ML-% IV SOLN
INTRAVENOUS | Status: AC
  Filled 2020-06-14: qty 250

## 2020-06-14 MED ORDER — ATROPINE SULFATE 1 MG/10ML IJ SOSY
PREFILLED_SYRINGE | INTRAMUSCULAR | Status: AC
  Filled 2020-06-14: qty 10

## 2020-06-14 MED ORDER — FAMOTIDINE 20 MG PO TABS: 40.0000 mg | ORAL_TABLET | Freq: Once | ORAL | Status: DC | PRN

## 2020-06-14 MED ORDER — MIDAZOLAM HCL 2 MG/ML PO SYRP: 8.0000 mg | ORAL_SOLUTION | Freq: Once | ORAL | Status: DC | PRN

## 2020-06-14 MED ORDER — DEXTROSE 50 % IV SOLN
INTRAVENOUS | Status: AC
  Filled 2020-06-14: qty 50

## 2020-06-14 MED ORDER — FENTANYL CITRATE (PF) 100 MCG/2ML IJ SOLN
INTRAMUSCULAR | Status: DC | PRN
Start: 2020-06-14 — End: 2020-06-14
  Administered 2020-06-14: 25 ug via INTRAVENOUS

## 2020-06-14 MED ORDER — HEPARIN SODIUM (PORCINE) 1000 UNIT/ML IJ SOLN
INTRAMUSCULAR | Status: DC | PRN
  Administered 2020-06-14: 7000 [IU] via INTRAVENOUS

## 2020-06-14 MED ORDER — DEXTROSE 50 % IV SOLN
1.0000 | Freq: Once | INTRAVENOUS | Status: AC
  Administered 2020-06-14: 50 mL via INTRAVENOUS

## 2020-06-14 MED ORDER — ONDANSETRON HCL 4 MG/2ML IJ SOLN: 4.0000 mg | Freq: Four times a day (QID) | INTRAMUSCULAR | Status: DC | PRN

## 2020-06-14 MED ORDER — HYDROMORPHONE HCL 1 MG/ML IJ SOLN: 1.0000 mg | Freq: Once | INTRAMUSCULAR | Status: DC | PRN

## 2020-06-14 MED ORDER — DIPHENHYDRAMINE HCL 50 MG/ML IJ SOLN
INTRAMUSCULAR | Status: AC
  Filled 2020-06-14: qty 1

## 2020-06-14 MED ORDER — HEPARIN SODIUM (PORCINE) 1000 UNIT/ML IJ SOLN
INTRAMUSCULAR | Status: AC
  Filled 2020-06-14: qty 1

## 2020-06-14 MED ORDER — FENTANYL CITRATE (PF) 100 MCG/2ML IJ SOLN
INTRAMUSCULAR | Status: AC
  Filled 2020-06-14: qty 2

## 2020-06-14 SURGICAL SUPPLY — 13 items
CATH ANGIO 5F 100CM .035 PIG (CATHETERS) ×2 IMPLANT
CATH BEACON 5 .035 100 JB2 TIP (CATHETERS) ×2 IMPLANT
CATH G 5FX100 (CATHETERS) ×2 IMPLANT
DEVICE PRESTO INFLATION (MISCELLANEOUS) IMPLANT
DEVICE STARCLOSE SE CLOSURE (Vascular Products) ×2 IMPLANT
DEVICE TORQUE .025-.038 (MISCELLANEOUS) ×2 IMPLANT
GLIDEWIRE ANGLED SS 035X260CM (WIRE) ×4 IMPLANT
KIT CAROTID MANIFOLD (MISCELLANEOUS) ×2 IMPLANT
PACK ANGIOGRAPHY (CUSTOM PROCEDURE TRAY) ×2 IMPLANT
SHEATH BRITE TIP 6FRX11 (SHEATH) ×2 IMPLANT
SHEATH SHUTTLE SELECT 6F (SHEATH) ×2 IMPLANT
WIRE G VAS 035X260 STIFF (WIRE) ×2 IMPLANT
WIRE J 3MM .035X145CM (WIRE) ×2 IMPLANT

## 2020-06-14 NOTE — Op Note (Signed)
[facility]   OPERATIVE NOTE  DATE: 06/14/2020  PRE-OPERATIVE DIAGNOSIS: 1. Left carotid artery stenosis, previous stroke 2. Chronic kidney disease precluding CT angiogram  POST-OPERATIVE DIAGNOSIS: Same as above  PROCEDURE: 1.   Ultrasound Guidance for vascular access right femoral artery 2.   Catheter placement into left common carotid artery from right femoral approach 3.   Thoracic aortogram 4.   Cervical and cerebral bilateral carotid angiograms 5.   StarClose closure device right femoral artery  SURGEON: [name], MD  ASSISTANT(S): None  ANESTHESIA: Moderate conscious sedation  ESTIMATED BLOOD LOSS: 10 cc  FLUORO TIME: 3.7 min  CONTRAST: 25  MODERATE CONSCIOUS SEDATION TIME: Approximately 38 minutes using 1 of Versed and 25 mcg of Fentanyl  FINDING(S): 1.  30-40% let ICA stenosis.  Normal intracranial filling.  SPECIMEN(S):  None  INDICATIONS:   Patient is a 86 y.o.female who presents with previous history of stroke and a carotid duplex showing significant carotid disease.  Her symptoms were on the right and moderate to possibly even high-grade carotid stenosis was suggested by previous duplex. The patient's renal function precluded CT angiogram. Catheter-based angiogram is performed for further evaluation. Risks and benefits are discussed and informed consent was obtained.  DESCRIPTION: After obtaining full informed written consent, the patient was brought back to the operating room and placed supine upon the vascular suite table.  After obtaining adequate anesthesia, the patient was prepped and draped in the standard fashion.  Moderate conscious sedation was administered during a face to face encounter with the patient throughout the procedure with my supervision of the RN administering medicines and monitoring the patients vital signs and mental status throughout from the start of the procedure until the patient was taken to the recovery room.  The right femoral artery was visualized with ultrasound and found to be calcific but patent. It was then accessed under direct ultrasound guidance without difficulty with a Seldinger needle. A J-wire and 6 French sheath were placed and a permanent image was recorded. The patient was given 7000 units of intravenous heparin as intervention was planned if significant stenosis was seen. A pigtail catheter was placed into the ascending aorta and an LAO projection thoracic aortogram was performed. 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 I selectively cannulated the left common carotid artery without difficulty with a JB2 catheter but this would not track, so I exchanged for a Bentson Hanafee catheter and advanced into the mid to distal left common carotid artery. Selective imaging was then performed of the cervical and cerebral carotid artery on the left. Intracranial filling was brisk with no lesions. The cervical carotid artery was mildly stenotic with a stenosis in the 30 to 40% range although some mild ulceration was seen. Multiple views were taken in the cervical carotid artery.  The right carotid artery was not imaged today due to her extremely poor renal function with a creatinine clearance of 8.   At this point, we had imaging to plan our treatment and we elected to terminate the procedure. The diagnostic catheter was removed. Oblique arteriogram was performed of the right femoral artery and StarClose closure device was deployed in usual fashion with excellent hemostatic result. The patient tolerated the procedure well and was taken to the recovery room in stable condition.  COMPLICATIONS: None  CONDITION: Stable   [name] 06/14/2020 11:12 AM   This note was created with Dragon Medical transcription system. Any errors in dictation are purely unintentional.

## 2020-06-14 NOTE — Interval H&P Note (Signed)
History and Physical Interval Note:  06/14/2020 9:01 AM  [name]  has presented today for surgery, with the diagnosis of LT Carotid Stent Repair  ABBOTT   Dye Allergy   Carotid artery stenotis Covid  Sept 8.  The various methods of treatment have been discussed with the patient and family. After consideration of risks, benefits and other options for treatment, the patient has consented to  Procedure(s): CAROTID PTA/STENT INTERVENTION (Left) as a surgical intervention.  The patient's history has been reviewed, patient examined, no change in status, stable for surgery.  I have reviewed the patient's chart and labs.  Questions were answered to the patient's satisfaction.     [name]

## 2020-06-14 NOTE — Discharge Instructions (Signed)

## 2020-06-22 ENCOUNTER — Inpatient Hospital Stay
Admission: EM | Admit: 2020-06-22 | Discharge: 2020-07-02 | DRG: 674 | Disposition: A | Discharge status: Home Health | Payer: Medicare Other | Attending: Internal Medicine | Admitting: Internal Medicine

## 2020-06-22 ENCOUNTER — Encounter: Payer: Self-pay | Admitting: Emergency Medicine

## 2020-06-22 ENCOUNTER — Other Ambulatory Visit: Payer: Self-pay

## 2020-06-22 DIAGNOSIS — Z923 Personal history of irradiation: Secondary | ICD-10-CM

## 2020-06-22 DIAGNOSIS — Z885 Allergy status to narcotic agent status: Secondary | ICD-10-CM

## 2020-06-22 DIAGNOSIS — D472 Monoclonal gammopathy: Secondary | ICD-10-CM | POA: Diagnosis present

## 2020-06-22 DIAGNOSIS — Z95828 Presence of other vascular implants and grafts: Secondary | ICD-10-CM

## 2020-06-22 DIAGNOSIS — Z9109 Other allergy status, other than to drugs and biological substances: Secondary | ICD-10-CM

## 2020-06-22 DIAGNOSIS — Z20822 Contact with and (suspected) exposure to covid-19: Secondary | ICD-10-CM | POA: Diagnosis present

## 2020-06-22 DIAGNOSIS — Z79899 Other long term (current) drug therapy: Secondary | ICD-10-CM

## 2020-06-22 DIAGNOSIS — N185 Chronic kidney disease, stage 5: Secondary | ICD-10-CM | POA: Diagnosis present

## 2020-06-22 DIAGNOSIS — J302 Other seasonal allergic rhinitis: Secondary | ICD-10-CM | POA: Diagnosis present

## 2020-06-22 DIAGNOSIS — Z90710 Acquired absence of both cervix and uterus: Secondary | ICD-10-CM

## 2020-06-22 DIAGNOSIS — K219 Gastro-esophageal reflux disease without esophagitis: Secondary | ICD-10-CM | POA: Diagnosis present

## 2020-06-22 DIAGNOSIS — N2581 Secondary hyperparathyroidism of renal origin: Secondary | ICD-10-CM | POA: Diagnosis present

## 2020-06-22 DIAGNOSIS — N179 Acute kidney failure, unspecified: Principal | ICD-10-CM | POA: Diagnosis present

## 2020-06-22 DIAGNOSIS — E785 Hyperlipidemia, unspecified: Secondary | ICD-10-CM | POA: Diagnosis present

## 2020-06-22 DIAGNOSIS — Z8673 Personal history of transient ischemic attack (TIA), and cerebral infarction without residual deficits: Secondary | ICD-10-CM

## 2020-06-22 DIAGNOSIS — H3530 Unspecified macular degeneration: Secondary | ICD-10-CM | POA: Diagnosis present

## 2020-06-22 DIAGNOSIS — Z951 Presence of aortocoronary bypass graft: Secondary | ICD-10-CM

## 2020-06-22 DIAGNOSIS — F419 Anxiety disorder, unspecified: Secondary | ICD-10-CM | POA: Diagnosis present

## 2020-06-22 DIAGNOSIS — E1121 Type 2 diabetes mellitus with diabetic nephropathy: Secondary | ICD-10-CM | POA: Diagnosis present

## 2020-06-22 DIAGNOSIS — E039 Hypothyroidism, unspecified: Secondary | ICD-10-CM | POA: Diagnosis present

## 2020-06-22 DIAGNOSIS — I10 Essential (primary) hypertension: Secondary | ICD-10-CM | POA: Diagnosis present

## 2020-06-22 DIAGNOSIS — E1122 Type 2 diabetes mellitus with diabetic chronic kidney disease: Secondary | ICD-10-CM | POA: Diagnosis present

## 2020-06-22 DIAGNOSIS — E875 Hyperkalemia: Secondary | ICD-10-CM | POA: Diagnosis present

## 2020-06-22 DIAGNOSIS — Z853 Personal history of malignant neoplasm of breast: Secondary | ICD-10-CM | POA: Diagnosis not present

## 2020-06-22 DIAGNOSIS — R9431 Abnormal electrocardiogram [ECG] [EKG]: Secondary | ICD-10-CM

## 2020-06-22 DIAGNOSIS — D631 Anemia in chronic kidney disease: Secondary | ICD-10-CM | POA: Diagnosis present

## 2020-06-22 DIAGNOSIS — I2510 Atherosclerotic heart disease of native coronary artery without angina pectoris: Secondary | ICD-10-CM | POA: Diagnosis present

## 2020-06-22 DIAGNOSIS — Z7902 Long term (current) use of antithrombotics/antiplatelets: Secondary | ICD-10-CM

## 2020-06-22 DIAGNOSIS — G894 Chronic pain syndrome: Secondary | ICD-10-CM | POA: Diagnosis present

## 2020-06-22 DIAGNOSIS — I6522 Occlusion and stenosis of left carotid artery: Secondary | ICD-10-CM | POA: Diagnosis present

## 2020-06-22 DIAGNOSIS — Z881 Allergy status to other antibiotic agents status: Secondary | ICD-10-CM

## 2020-06-22 DIAGNOSIS — E1140 Type 2 diabetes mellitus with diabetic neuropathy, unspecified: Secondary | ICD-10-CM | POA: Diagnosis present

## 2020-06-22 DIAGNOSIS — I5031 Acute diastolic (congestive) heart failure: Secondary | ICD-10-CM | POA: Diagnosis not present

## 2020-06-22 DIAGNOSIS — I120 Hypertensive chronic kidney disease with stage 5 chronic kidney disease or end stage renal disease: Secondary | ICD-10-CM | POA: Diagnosis present

## 2020-06-22 DIAGNOSIS — Z7982 Long term (current) use of aspirin: Secondary | ICD-10-CM

## 2020-06-22 DIAGNOSIS — Z8249 Family history of ischemic heart disease and other diseases of the circulatory system: Secondary | ICD-10-CM

## 2020-06-22 DIAGNOSIS — Z79890 Hormone replacement therapy (postmenopausal): Secondary | ICD-10-CM

## 2020-06-22 DIAGNOSIS — I509 Heart failure, unspecified: Secondary | ICD-10-CM

## 2020-06-22 DIAGNOSIS — R319 Hematuria, unspecified: Secondary | ICD-10-CM | POA: Diagnosis present

## 2020-06-22 DIAGNOSIS — I6529 Occlusion and stenosis of unspecified carotid artery: Secondary | ICD-10-CM | POA: Diagnosis present

## 2020-06-22 DIAGNOSIS — D649 Anemia, unspecified: Secondary | ICD-10-CM

## 2020-06-22 DIAGNOSIS — E079 Disorder of thyroid, unspecified: Secondary | ICD-10-CM | POA: Diagnosis present

## 2020-06-22 DIAGNOSIS — D696 Thrombocytopenia, unspecified: Secondary | ICD-10-CM | POA: Diagnosis present

## 2020-06-22 DIAGNOSIS — Z87448 Personal history of other diseases of urinary system: Secondary | ICD-10-CM

## 2020-06-22 DIAGNOSIS — Z111 Encounter for screening for respiratory tuberculosis: Secondary | ICD-10-CM

## 2020-06-22 DIAGNOSIS — I6340 Cerebral infarction due to embolism of unspecified cerebral artery: Secondary | ICD-10-CM | POA: Diagnosis present

## 2020-06-22 DIAGNOSIS — N059 Unspecified nephritic syndrome with unspecified morphologic changes: Secondary | ICD-10-CM | POA: Diagnosis not present

## 2020-06-22 DIAGNOSIS — E119 Type 2 diabetes mellitus without complications: Secondary | ICD-10-CM

## 2020-06-22 DIAGNOSIS — Z91041 Radiographic dye allergy status: Secondary | ICD-10-CM

## 2020-06-22 DIAGNOSIS — I776 Arteritis, unspecified: Secondary | ICD-10-CM | POA: Diagnosis not present

## 2020-06-22 DIAGNOSIS — Z803 Family history of malignant neoplasm of breast: Secondary | ICD-10-CM

## 2020-06-22 DIAGNOSIS — Z87891 Personal history of nicotine dependence: Secondary | ICD-10-CM

## 2020-06-22 LAB — CBC
HCT: 25.5 % — ABNORMAL LOW (ref 36.0–46.0)
Hemoglobin: 8.2 g/dL — ABNORMAL LOW (ref 12.0–15.0)
MCH: 29 pg (ref 26.0–34.0)
MCHC: 32.2 g/dL (ref 30.0–36.0)
MCV: 90.1 fL (ref 80.0–100.0)
Platelets: 181 10*3/uL (ref 150–400)
RBC: 2.83 MIL/uL — ABNORMAL LOW (ref 3.87–5.11)
RDW: 15.9 % — ABNORMAL HIGH (ref 11.5–15.5)
WBC: 7.2 10*3/uL (ref 4.0–10.5)
nRBC: 0 % (ref 0.0–0.2)

## 2020-06-22 LAB — COMPREHENSIVE METABOLIC PANEL
ALT: 6 U/L (ref 0–44)
AST: 10 U/L — ABNORMAL LOW (ref 15–41)
Albumin: 3.1 g/dL — ABNORMAL LOW (ref 3.5–5.0)
Alkaline Phosphatase: 78 U/L (ref 38–126)
Anion gap: 10 (ref 5–15)
BUN: 64 mg/dL — ABNORMAL HIGH (ref 8–23)
CO2: 25 mmol/L (ref 22–32)
Calcium: 7.6 mg/dL — ABNORMAL LOW (ref 8.9–10.3)
Chloride: 100 mmol/L (ref 98–111)
Creatinine, Ser: 5.15 mg/dL — ABNORMAL HIGH (ref 0.44–1.00)
GFR calc Af Amer: 8 mL/min — ABNORMAL LOW (ref >60)
GFR calc non Af Amer: 7 mL/min — ABNORMAL LOW (ref >60)
Glucose, Bld: 161 mg/dL — ABNORMAL HIGH (ref 70–99)
Potassium: 5.6 mmol/L — ABNORMAL HIGH (ref 3.5–5.1)
Sodium: 135 mmol/L (ref 135–145)
Total Bilirubin: 0.7 mg/dL (ref 0.3–1.2)
Total Protein: 7.9 g/dL (ref 6.5–8.1)

## 2020-06-22 LAB — SARS CORONAVIRUS 2 BY RT PCR (HOSPITAL ORDER, PERFORMED IN ~~LOC~~ HOSPITAL LAB): SARS Coronavirus 2: NEGATIVE

## 2020-06-22 LAB — HEMOGLOBIN A1C
Hgb A1c MFr Bld: 5.3 % (ref 4.8–5.6)
Mean Plasma Glucose: 105.41 mg/dL

## 2020-06-22 LAB — GLUCOSE, CAPILLARY: Glucose-Capillary: 105 mg/dL — ABNORMAL HIGH (ref 70–99)

## 2020-06-22 LAB — FERRITIN: Ferritin: 56 ng/mL (ref 11–307)

## 2020-06-22 LAB — IRON AND TIBC
Iron: 33 ug/dL (ref 28–170)
Saturation Ratios: 11 % (ref 10.4–31.8)
TIBC: 302 ug/dL (ref 250–450)
UIBC: 269 ug/dL

## 2020-06-22 MED ORDER — DOCUSATE SODIUM 100 MG PO CAPS
200.0000 mg | ORAL_CAPSULE | Freq: Every day | ORAL | Status: DC
  Administered 2020-06-22 – 2020-07-02 (×7): 200 mg via ORAL
  Filled 2020-06-22 (×10): qty 2

## 2020-06-22 MED ORDER — INSULIN ASPART 100 UNIT/ML ~~LOC~~ SOLN
0.0000 [IU] | Freq: Three times a day (TID) | SUBCUTANEOUS | Status: DC
  Administered 2020-06-23 (×2): 1 [IU] via SUBCUTANEOUS
  Administered 2020-06-23: 2 [IU] via SUBCUTANEOUS
  Administered 2020-06-24: 3 [IU] via SUBCUTANEOUS
  Administered 2020-06-24: 2 [IU] via SUBCUTANEOUS
  Administered 2020-06-24 – 2020-07-01 (×5): 1 [IU] via SUBCUTANEOUS
  Administered 2020-07-01 – 2020-07-02 (×2): 2 [IU] via SUBCUTANEOUS
  Filled 2020-06-22 (×12): qty 1

## 2020-06-22 MED ORDER — ALPRAZOLAM 0.5 MG PO TABS
0.5000 mg | ORAL_TABLET | Freq: Every day | ORAL | Status: DC | PRN
  Administered 2020-06-23 – 2020-07-02 (×6): 0.5 mg via ORAL
  Filled 2020-06-22 (×7): qty 1

## 2020-06-22 MED ORDER — ASPIRIN 81 MG PO CHEW
81.0000 mg | CHEWABLE_TABLET | Freq: Every day | ORAL | Status: DC
  Administered 2020-06-23: 81 mg via ORAL
  Filled 2020-06-22: qty 1

## 2020-06-22 MED ORDER — LEVOTHYROXINE SODIUM 50 MCG PO TABS
150.0000 ug | ORAL_TABLET | Freq: Every day | ORAL | Status: DC
  Administered 2020-06-23 – 2020-07-02 (×10): 150 ug via ORAL
  Filled 2020-06-22 (×4): qty 1
  Filled 2020-06-22 (×2): qty 3
  Filled 2020-06-22: qty 1
  Filled 2020-06-22: qty 3
  Filled 2020-06-22 (×2): qty 1

## 2020-06-22 MED ORDER — SODIUM CHLORIDE 0.9% FLUSH
3.0000 mL | Freq: Two times a day (BID) | INTRAVENOUS | Status: DC
  Administered 2020-06-22 – 2020-07-02 (×20): 3 mL via INTRAVENOUS

## 2020-06-22 MED ORDER — PROCHLORPERAZINE EDISYLATE 10 MG/2ML IJ SOLN
10.0000 mg | Freq: Once | INTRAMUSCULAR | Status: AC
  Administered 2020-06-23: 10 mg via INTRAVENOUS
  Filled 2020-06-22: qty 2

## 2020-06-22 MED ORDER — HYDROCODONE-ACETAMINOPHEN 5-325 MG PO TABS
0.5000 | ORAL_TABLET | Freq: Four times a day (QID) | ORAL | Status: DC | PRN
  Administered 2020-06-22 – 2020-06-25 (×5): 0.5 via ORAL
  Filled 2020-06-22 (×5): qty 1

## 2020-06-22 MED ORDER — METOPROLOL TARTRATE 50 MG PO TABS
50.0000 mg | ORAL_TABLET | Freq: Three times a day (TID) | ORAL | Status: DC
  Administered 2020-06-22 – 2020-07-02 (×25): 50 mg via ORAL
  Filled 2020-06-22 (×26): qty 1

## 2020-06-22 MED ORDER — PATIROMER SORBITEX CALCIUM 8.4 G PO PACK
8.4000 g | PACK | Freq: Once | ORAL | Status: AC
  Administered 2020-06-23: 8.4 g via ORAL
  Filled 2020-06-22: qty 1

## 2020-06-22 MED ORDER — CLOPIDOGREL BISULFATE 75 MG PO TABS
75.0000 mg | ORAL_TABLET | Freq: Once | ORAL | Status: AC
  Administered 2020-06-23: 75 mg via ORAL
  Filled 2020-06-22: qty 1

## 2020-06-22 MED ORDER — HEPARIN SODIUM (PORCINE) 5000 UNIT/ML IJ SOLN
5000.0000 [IU] | Freq: Three times a day (TID) | INTRAMUSCULAR | Status: DC
  Administered 2020-06-22 – 2020-06-23 (×2): 5000 [IU] via SUBCUTANEOUS
  Filled 2020-06-22 (×4): qty 1

## 2020-06-22 MED ORDER — LACTATED RINGERS IV BOLUS
1000.0000 mL | Freq: Once | INTRAVENOUS | Status: AC
  Administered 2020-06-22: 1000 mL via INTRAVENOUS

## 2020-06-22 MED ORDER — PATIROMER SORBITEX CALCIUM 8.4 G PO PACK
8.4000 g | PACK | Freq: Every day | ORAL | Status: DC
  Administered 2020-06-23: 8.4 g via ORAL
  Filled 2020-06-22: qty 1

## 2020-06-22 MED ORDER — HYDRALAZINE HCL 50 MG PO TABS
25.0000 mg | ORAL_TABLET | Freq: Three times a day (TID) | ORAL | Status: DC
  Administered 2020-06-22 – 2020-06-23 (×2): 25 mg via ORAL
  Filled 2020-06-22 (×2): qty 1

## 2020-06-22 MED ORDER — SODIUM CHLORIDE 0.9 % IV SOLN
250.0000 mL | INTRAVENOUS | Status: DC | PRN
  Administered 2020-06-28 – 2020-07-02 (×2): 250 mL via INTRAVENOUS

## 2020-06-22 MED ORDER — SODIUM CHLORIDE 0.9% FLUSH
3.0000 mL | INTRAVENOUS | Status: DC | PRN
  Administered 2020-06-25: 3 mL via INTRAVENOUS

## 2020-06-22 NOTE — ED Provider Notes (Signed)
[facility] Emergency Department Provider Note   ____________________________________________   First MD Initiated Contact with Patient 06/22/20 1628     (approximate)  I have reviewed the triage vital signs and the nursing notes.   HISTORY  Chief Complaint Abnormal Lab    HPI [name] is a 86 y.o. female with past medical history of hypertension, hyperlipidemia, diabetes, CAD status post CABG, stroke, and CKD who presents to the ED for abnormal labs.  Patient reports she was seen by her PCP yesterday, Dr. Doy Hutching, who called her today to let her know that her kidney function was worsening and that she needed to go to the hospital.  Patient states that she has been feeling well and denies any recent fevers, cough, chest pain, shortness of breath, dysuria, hematuria, vomiting, or diarrhea.  She states that she has been making a normal amount of urine and has been drinking plenty of water.  She was also told that her blood counts are low, but she denies any bleeding including blood in her stool or dark black stool.        Past Medical History:  Diagnosis Date  . Angina pectoris ([place]) 08/26/2012  . Arthritis   . Breast cancer ([place]) 2012   right breast  . Breast mass, right   . Cancer [facility])    breast cancer, right side 2012  . Chronic kidney disease    RENAL INSUFF  . Complication of anesthesia   . Coronary artery disease 08/22/2012   sees Dr Rockey Situ  . Diabetes ([place])   . Dyspnea    ON EXERTION  . Gastritis    hx of  . GERD (gastroesophageal reflux disease)   . Headache(784.0)    migraines  . History of breast cancer    39 treatments of radiation. Negative chemo.  Marland Kitchen History of seasonal allergies   . Hyperlipemia   . Hypertension    sees Dr. Fulton Reek  . Hypothyroidism   . Macular degeneration    Bilateral  . Neuropathy   . Personal history of radiation therapy   . Pneumonia    hx of  . PONV (postoperative nausea and  vomiting)   . 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 Stroke ([place])    2012  . Thyroid disease   . Vertigo     Patient Active Problem List   Diagnosis Date Noted  . Stroke ([place]) 06/01/2020  . CAD (coronary artery disease) 06/01/2020  . Thyroid disease 06/01/2020  . CKD (chronic kidney disease) stage 5, GFR less than 15 ml/min (HCC) 06/01/2020  . Carotid stenosis 06/01/2020  . Pain syndrome, chronic 02/16/2020  . Weakness 08/30/2016  . Acute renal failure (ARF) ([place]) 05/04/2016  . Type 2 diabetes with nephropathy ([place]) 09/02/2015  . HTN, goal below 140/80 08/20/2015  . Diabetes mellitus type 2, uncomplicated ([place]) 88/50/2774  . Cerebral embolism with cerebral infarction ([place]) 09/07/2012  . Hemiplegia, unspecified, affecting dominant side 09/07/2012  . 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 Coronary artery disease 08/22/2012  . Hyperlipidemia 08/19/2012  . Diabetes mellitus ([place]) 08/19/2012  . Hypertension 08/19/2012  . Breast cancer ([place]) 08/19/2012  . GERD (gastroesophageal reflux disease) 08/19/2012    Past Surgical History:  Procedure Laterality Date  . ABDOMINAL HYSTERECTOMY    . APPENDECTOMY    . BACK SURGERY    . BREAST BIOPSY Right  2012 positive- [facility]  . BREAST LUMPECTOMY Right 2012   F/U radiation   . BREAST SURGERY    . CARDIAC CATHETERIZATION  2014  . CAROTID PTA/STENT INTERVENTION Left 06/14/2020   Procedure: CAROTID PTA/STENT INTERVENTION;  Surgeon: Algernon Huxley, MD;  Location: [place] [facility];  Service: Cardiovascular;  Laterality: Left;  . CATARACT EXTRACTION W/PHACO Right 08/14/2017   Procedure: CATARACT EXTRACTION PHACO AND INTRAOCULAR LENS PLACEMENT (IOC);  Surgeon: Birder Robson, MD;  Location: [facility];  Service: Ophthalmology;  Laterality: Right;  [place] 00:43.0 AP% 17.1 CDE 7.37 Fluid Pack lot # [id number]  . CATARACT EXTRACTION W/PHACO Left 10/09/2017   Procedure: CATARACT EXTRACTION PHACO  AND INTRAOCULAR LENS PLACEMENT ([place]);  Surgeon: Birder Robson, MD;  Location: [facility];  Service: Ophthalmology;  Laterality: Left;  [place] 00:30.3 AP% 11.0 CDE 3.33 Fluid Pack Lot # [id number]  . CORONARY ARTERY BYPASS GRAFT  09/05/2012   Procedure: CORONARY ARTERY BYPASS GRAFTING (CABG);  Surgeon: Rexene Alberts, MD;  Location: [place];  Service: Open Heart Surgery;  Laterality: N/A;  CABG x four, using left internal mammary artery and bilateral greater saphenous vein harvested endoscopically  . INTRAOPERATIVE TRANSESOPHAGEAL ECHOCARDIOGRAM  09/05/2012   Procedure: INTRAOPERATIVE TRANSESOPHAGEAL ECHOCARDIOGRAM;  Surgeon: Rexene Alberts, MD;  Location: [place];  Service: Open Heart Surgery;  Laterality: N/A;  . KNEE SURGERY     Bilateral nerve block  . LAPAROSCOPIC NISSEN FUNDOPLICATION    . OVARIAN CYST REMOVAL      Prior to Admission medications   Medication Sig Start Date End Date Taking? Authorizing Provider  ALPRAZolam Duanne Moron) 0.5 MG tablet Take 0.5 mg daily as needed by mouth for anxiety.     [provider]  aspirin 81 MG chewable tablet Chew 81 mg by mouth daily.    [provider]  butalbital-acetaminophen-caffeine (ESGIC) 50-325-40 MG tablet Take 1 tablet daily as needed by mouth for headache or migraine.    [provider]  Calcium Carbonate-Vitamin D (CALCIUM 600+D PO) Take 1 tablet daily by mouth.    [provider]  clopidogrel (PLAVIX) 75 MG tablet Take by mouth. 04/29/20 04/29/21  [provider]  Cyanocobalamin 1000 MCG SUBL Take by mouth. 02/12/19   [provider]  docusate sodium (COLACE) 250 MG capsule Take 250 mg daily by mouth.    [provider]  furosemide (LASIX) 40 MG tablet Take 40 mg daily as needed by mouth for fluid or edema.     [provider]  gabapentin (NEURONTIN) 100 MG capsule Take 100 mg by mouth 2 (two) times daily. 09/23/12   Love, Ivan Anchors, PA-C  glimepiride (AMARYL) 4 MG tablet Take 4  mg by mouth 2 (two) times daily. If blood sugar is over 200 take 4 mgs twice daily for 30 days     [provider]  hydrALAZINE (APRESOLINE) 25 MG tablet Take 25 mg 3 (three) times daily by mouth.    [provider]  HYDROcodone-acetaminophen (NORCO/VICODIN) 5-325 MG tablet Take 1-2 tablets by mouth every 4 (four) hours as needed for moderate pain. 07/20/15   Idelle Crouch, MD  levothyroxine (SYNTHROID, LEVOTHROID) 150 MCG tablet Take 150 mcg daily before breakfast by mouth.    [provider]  metoprolol (LOPRESSOR) 50 MG tablet Take 1 tablet (50 mg total) by mouth every 8 (eight) hours. 07/20/15   Idelle Crouch, MD  Multiple Vitamins-Minerals (ICAPS AREDS 2) CAPS Take 1 capsule 2 (two) times daily by mouth.  [provider]  Naphazoline HCl (CLEAR EYES OP) Apply 1 drop daily as needed to eye (dry eyes).    [provider]  ondansetron (ZOFRAN ODT) 4 MG disintegrating tablet Take 1 tablet (4 mg total) by mouth every 8 (eight) hours as needed for nausea or vomiting. 05/09/18   Earleen Newport, MD  promethazine (PHENERGAN) 12.5 MG tablet Take 1 tablet (12.5 mg total) by mouth every 6 (six) hours as needed for nausea or vomiting. 05/09/18   Earleen Newport, MD    Allergies Clonidine, Darvocet [propoxyphene n-acetaminophen], Duratuss g [guaifenesin], Ketek  [telithromycin], Ciprocinonide [fluocinolone], Ivp dye [iodinated diagnostic agents], Levaquin [levofloxacin], and Statins  Family History  Problem Relation Age of Onset  . Heart disease Brother        CABG & stents  . Hyperlipidemia Brother   . Hypertension Brother   . Cancer Father   . Heart disease Mother   . Breast cancer Cousin   . Kidney disease Neg Hx     Social History Social History   Tobacco Use  . Smoking status: Former Smoker    Types: Cigarettes    Quit date: 11/13/1988    Years since quitting: 31.6  . Smokeless tobacco: Never Used  Substance Use Topics  .  Alcohol use: No  . Drug use: No    Review of Systems  Constitutional: No fever/chills.  Positive for abnormal labs. Eyes: No visual changes. ENT: No sore throat. Cardiovascular: Denies chest pain. Respiratory: Denies shortness of breath. Gastrointestinal: No abdominal pain.  No nausea, no vomiting.  No diarrhea.  No constipation. Genitourinary: Negative for dysuria. Musculoskeletal: Negative for back pain. Skin: Negative for rash. Neurological: Negative for headaches, focal weakness or numbness.  ____________________________________________   PHYSICAL EXAM:  VITAL SIGNS: ED Triage Vitals  Enc Vitals Group     BP 06/22/20 1414 (!) 143/54     Pulse Rate 06/22/20 1414 89     Resp 06/22/20 1414 18     Temp 06/22/20 1414 98 F (36.7 C)     Temp Source 06/22/20 1414 Oral     SpO2 06/22/20 1414 96 %     Weight --      Height --      Head Circumference --      Peak Flow --      Pain Score 06/22/20 1427 0     Pain Loc --      Pain Edu? --      Excl. in [place]? --     Constitutional: Alert and oriented. Eyes: Conjunctivae are normal. Head: Atraumatic. Nose: No congestion/rhinnorhea. Mouth/Throat: Mucous membranes are moist. Neck: Normal ROM Cardiovascular: Normal rate, regular rhythm. Grossly normal heart sounds. Respiratory: Normal respiratory effort.  No retractions. Lungs CTAB. Gastrointestinal: Soft and nontender. No distention. Genitourinary: deferred Musculoskeletal: No lower extremity tenderness nor edema. Neurologic:  Normal speech and language. No gross focal neurologic deficits are appreciated. Skin:  Skin is warm, dry and intact. No rash noted. Psychiatric: Mood and affect are normal. Speech and behavior are normal.  ____________________________________________   LABS (all labs ordered are listed, but only abnormal results are displayed)  Labs Reviewed  COMPREHENSIVE METABOLIC PANEL - Abnormal; Notable for the following components:      Result Value    Potassium 5.6 (*)    Glucose, Bld 161 (*)    BUN 64 (*)    Creatinine, Ser 5.15 (*)    Calcium 7.6 (*)    Albumin 3.1 (*)  AST 10 (*)    GFR calc non Af Amer 7 (*)    GFR calc Af Amer 8 (*)    All other components within normal limits  CBC - Abnormal; Notable for the following components:   RBC 2.83 (*)    Hemoglobin 8.2 (*)    HCT 25.5 (*)    RDW 15.9 (*)    All other components within normal limits  SARS CORONAVIRUS 2 BY RT PCR (HOSPITAL ORDER, [place] [facility])  URINALYSIS, COMPLETE (UACMP) WITH MICROSCOPIC   ____________________________________________  EKG  ED ECG REPORT I, Blake Divine, the attending physician, personally viewed and interpreted this ECG.   Date: 06/22/2020  EKG Time: 17:06  Rate: 79  Rhythm: Sinus arrhythmia  Axis: Normal  Intervals:none  ST&T Change: None   PROCEDURES  Procedure(s) performed (including Critical Care):  Procedures   ____________________________________________   INITIAL IMPRESSION / ASSESSMENT AND PLAN / ED COURSE       86 year old female with past medical history of CAD status post CABG, hypertension, hyperlipidemia, diabetes, stroke, and CKD who presents to the ED for worsening renal function noted by her PCP. Patient states she has been feeling well with no decrease in urine output or other urinary symptoms recently.  She does not appear especially dehydrated, however lab work shows elevated creatinine at greater than 5 along with mildly elevated potassium at 5.6.  No EKG changes noted and we will treat with IV fluids as well as [place].  Remainder of electrolytes are unremarkable and no symptoms to suggest infectious process at this time.  Her hemoglobin has been downtrending over a couple of months, patient has not noticed any bleeding and rectal exam shows guaiac negative stool.  We will admit for further monitoring of her renal function as well as hyperkalemia, case discussed with hospitalist for  admission.      ____________________________________________   FINAL CLINICAL IMPRESSION(S) / ED DIAGNOSES  Final diagnoses:  AKI (acute kidney injury) ([place])  Hyperkalemia  Anemia, unspecified type     ED Discharge Orders    None       Note:  This document was prepared using Dragon voice recognition software and may include unintentional dictation errors.   Blake Divine, MD 06/22/20 1710

## 2020-06-22 NOTE — ED Triage Notes (Signed)
Pt in via POV, advised per PCP to be evaluated here due to worsening kidney failure.  Most recent Creatinine 4.49.  Vitals WDL, NAD noted at this time.

## 2020-06-22 NOTE — H&P (Addendum)
History and Physical    LINCY BELLES LZJ:[id number] DOB: 04-06-1934 DOA: 06/22/2020  PCP: Idelle Crouch, MD  Patient coming from: home   Chief Complaint: told to go to ED by PCP   HPI: [name] is a 86 y.o. female with medical history significant for CKD 4/5, hypothyroid, CAD s/p cabg, carotid stenosis, CVA 04/2020, hypothyroid, who presents with the above.  Reports being in her usual state of health. No chest pain or palpitations. Normal PO, is making urine. No recent med changes. No vomiting or diarrhea. No fevers. Denies nsaid use. No toxic habits. Presented to her PCP's last week for regular f/u and labs, called today and told to go to the ED for "my kidneys are shutting down." Has been referred to nephrology but has not established.   ED Course: Labs showed cr of 5.15, k of 5.6, h of 8.2. Guaiac performed by EDP negative. 1 L LR ordered and a dose of veltassa. Prolonged qtc seen on ekg  Review of Systems: As per HPI otherwise 10 point review of systems negative.    Past Medical History:  Diagnosis Date  . Angina pectoris ([place]) 08/26/2012  . Arthritis   . Breast cancer ([place]) 2012   right breast  . Breast mass, right   . Cancer [facility])    breast cancer, right side 2012  . Chronic kidney disease    RENAL INSUFF  . Complication of anesthesia   . Coronary artery disease 08/22/2012   sees Dr Rockey Situ  . Diabetes ([place])   . Dyspnea    ON EXERTION  . Gastritis    hx of  . GERD (gastroesophageal reflux disease)   . Headache(784.0)    migraines  . History of breast cancer    39 treatments of radiation. Negative chemo.  Marland Kitchen History of seasonal allergies   . Hyperlipemia   . Hypertension    sees Dr. Fulton Reek  . Hypothyroidism   . Macular degeneration    Bilateral  . Neuropathy   . Personal history of radiation therapy   . Pneumonia    hx of  . PONV (postoperative nausea and vomiting)   . S/P CABG x 4 09/05/2012   LIMA to LAD, SVG to Diag, SVG to OM1,  SVG to PDA, EVH from bilateral thighs  . Stroke ([place])    2012  . Thyroid disease   . Vertigo     Past Surgical History:  Procedure Laterality Date  . ABDOMINAL HYSTERECTOMY    . APPENDECTOMY    . BACK SURGERY    . BREAST BIOPSY Right    2012 positive- [facility]  . BREAST LUMPECTOMY Right 2012   F/U radiation   . BREAST SURGERY    . CARDIAC CATHETERIZATION  2014  . CAROTID PTA/STENT INTERVENTION Left 06/14/2020   Procedure: CAROTID PTA/STENT INTERVENTION;  Surgeon: Algernon Huxley, MD;  Location: [facility];  Service: Cardiovascular;  Laterality: Left;  . CATARACT EXTRACTION W/PHACO Right 08/14/2017   Procedure: CATARACT EXTRACTION PHACO AND INTRAOCULAR LENS PLACEMENT (IOC);  Surgeon: Birder Robson, MD;  Location: [facility];  Service: Ophthalmology;  Laterality: Right;  [place] 00:43.0 AP% 17.1 CDE 7.37 Fluid Pack lot # [id number]  . CATARACT EXTRACTION W/PHACO Left 10/09/2017   Procedure: CATARACT EXTRACTION PHACO AND INTRAOCULAR LENS PLACEMENT ([place]);  Surgeon: Birder Robson, MD;  Location: [facility];  Service: Ophthalmology;  Laterality: Left;  [place] 00:30.3 AP% 11.0 CDE 3.33 Fluid Pack Lot # [id number]  . CORONARY ARTERY  BYPASS GRAFT  09/05/2012   Procedure: CORONARY ARTERY BYPASS GRAFTING (CABG);  Surgeon: Rexene Alberts, MD;  Location: [place];  Service: Open Heart Surgery;  Laterality: N/A;  CABG x four, using left internal mammary artery and bilateral greater saphenous vein harvested endoscopically  . INTRAOPERATIVE TRANSESOPHAGEAL ECHOCARDIOGRAM  09/05/2012   Procedure: INTRAOPERATIVE TRANSESOPHAGEAL ECHOCARDIOGRAM;  Surgeon: Rexene Alberts, MD;  Location: [facility];  Service: Open Heart Surgery;  Laterality: N/A;  . KNEE SURGERY     Bilateral nerve block  . LAPAROSCOPIC NISSEN FUNDOPLICATION    . OVARIAN CYST REMOVAL       reports that she quit smoking about 31 years ago. Her smoking use included cigarettes. She has never used smokeless tobacco. She reports that she does not drink  alcohol and does not use drugs.  Allergies  Allergen Reactions  . Clonidine Other (See Comments) and Shortness Of Breath  . Darvocet [Propoxyphene N-Acetaminophen] Anaphylaxis  . Duratuss G [Guaifenesin]     Other reaction(s): Unknown  . Ketek  [Telithromycin]     Other reaction(s): Unknown  . Ciprocinonide [Fluocinolone] Other (See Comments)    Feels crazy  . Ivp Dye [Iodinated Diagnostic Agents] Nausea And Vomiting and Other (See Comments)    Burning  . Levaquin [Levofloxacin] Nausea Only  . Statins Other (See Comments)    Myalgia    Family History  Problem Relation Age of Onset  . Heart disease Brother        CABG & stents  . Hyperlipidemia Brother   . Hypertension Brother   . Cancer Father   . Heart disease Mother   . Breast cancer Cousin   . Kidney disease Neg Hx     Prior to Admission medications   Medication Sig Start Date End Date Taking? Authorizing Provider  ALPRAZolam Duanne Moron) 0.5 MG tablet Take 0.5 mg daily as needed by mouth for anxiety.     [provider]  aspirin 81 MG chewable tablet Chew 81 mg by mouth daily.    [provider]  butalbital-acetaminophen-caffeine (ESGIC) 50-325-40 MG tablet Take 1 tablet daily as needed by mouth for headache or migraine.    [provider]  Calcium Carbonate-Vitamin D (CALCIUM 600+D PO) Take 1 tablet daily by mouth.    [provider]  clopidogrel (PLAVIX) 75 MG tablet Take by mouth. 04/29/20 04/29/21  [provider]  Cyanocobalamin 1000 MCG SUBL Take by mouth. 02/12/19   [provider]  docusate sodium (COLACE) 250 MG capsule Take 250 mg daily by mouth.    [provider]  furosemide (LASIX) 40 MG tablet Take 40 mg daily as needed by mouth for fluid or edema.     [provider]  gabapentin (NEURONTIN) 100 MG capsule Take 100 mg by mouth 2 (two) times daily. 09/23/12   Love, Ivan Anchors, PA-C  glimepiride (AMARYL) 4 MG tablet Take 4 mg by mouth 2 (two) times  daily. If blood sugar is over 200 take 4 mgs twice daily for 30 days     [provider]  hydrALAZINE (APRESOLINE) 25 MG tablet Take 25 mg 3 (three) times daily by mouth.    [provider]  HYDROcodone-acetaminophen (NORCO/VICODIN) 5-325 MG tablet Take 1-2 tablets by mouth every 4 (four) hours as needed for moderate pain. 07/20/15   Idelle Crouch, MD  levothyroxine (SYNTHROID, LEVOTHROID) 150 MCG tablet Take 150 mcg daily before breakfast by mouth.    [provider]  metoprolol (LOPRESSOR) 50 MG tablet  Take 1 tablet (50 mg total) by mouth every 8 (eight) hours. 07/20/15   Idelle Crouch, MD  Multiple Vitamins-Minerals (ICAPS AREDS 2) CAPS Take 1 capsule 2 (two) times daily by mouth.    [provider]  Naphazoline HCl (CLEAR EYES OP) Apply 1 drop daily as needed to eye (dry eyes).    [provider]  ondansetron (ZOFRAN ODT) 4 MG disintegrating tablet Take 1 tablet (4 mg total) by mouth every 8 (eight) hours as needed for nausea or vomiting. 05/09/18   Earleen Newport, MD  promethazine (PHENERGAN) 12.5 MG tablet Take 1 tablet (12.5 mg total) by mouth every 6 (six) hours as needed for nausea or vomiting. 05/09/18   Earleen Newport, MD    Physical Exam: Vitals:   06/22/20 1414 06/22/20 1645 06/22/20 1713  BP: (!) 143/54 (!) 159/70   Pulse: 89 91 81  Resp: 18  18  Temp: 98 F (36.7 C)    TempSrc: Oral    SpO2: 96% 91% 93%    Constitutional: No acute distress Head: Atraumatic Eyes: Conjunctiva clear ENM: Moist mucous membranes. Normal dentition.  Neck: Supple Respiratory: Clear to auscultation bilaterally, no wheezing/rales/rhonchi. Normal respiratory effort. No accessory muscle use. . Cardiovascular: Regular rate and rhythm. Distant heart sounds. Systolic murmur Abdomen: Non-tender, obese. No masses. No rebound or guarding. Positive bowel sounds. Musculoskeletal: No joint deformity upper and lower extremities. Normal ROM, no  contractures. Normal muscle tone.  Skin: No rashes, lesions, or ulcers.  Extremities: No peripheral edema. Palpable peripheral pulses. Neurologic: Alert, moving all 4 extremities. Psychiatric: Normal insight and judgement.   Labs on Admission: I have personally reviewed following labs and imaging studies  CBC: Recent Labs  Lab 06/22/20 1412  WBC 7.2  HGB 8.2*  HCT 25.5*  MCV 90.1  PLT 390   Basic Metabolic Panel: Recent Labs  Lab 06/22/20 1412  NA 135  K 5.6*  CL 100  CO2 25  GLUCOSE 161*  BUN 64*  CREATININE 5.15*  CALCIUM 7.6*   GFR: Estimated Creatinine Clearance: 9.2 mL/min (A) (by C-G formula based on SCr of 5.15 mg/dL (H)). Liver Function Tests: Recent Labs  Lab 06/22/20 1412  AST 10*  ALT 6  ALKPHOS 78  BILITOT 0.7  PROT 7.9  ALBUMIN 3.1*   No results for input(s): LIPASE, AMYLASE in the last 168 hours. No results for input(s): AMMONIA in the last 168 hours. Coagulation Profile: No results for input(s): INR, PROTIME in the last 168 hours. Cardiac Enzymes: No results for input(s): CKTOTAL, CKMB, CKMBINDEX, TROPONINI in the last 168 hours. BNP (last 3 results) No results for input(s): PROBNP in the last 8760 hours. HbA1C: No results for input(s): HGBA1C in the last 72 hours. CBG: No results for input(s): GLUCAP in the last 168 hours. Lipid Profile: No results for input(s): CHOL, HDL, LDLCALC, TRIG, CHOLHDL, LDLDIRECT in the last 72 hours. Thyroid Function Tests: No results for input(s): TSH, T4TOTAL, FREET4, T3FREE, THYROIDAB in the last 72 hours. Anemia Panel: No results for input(s): VITAMINB12, FOLATE, FERRITIN, TIBC, IRON, RETICCTPCT in the last 72 hours. Urine analysis:    Component Value Date/Time   COLORURINE YELLOW (A) 05/04/2016 1850   APPEARANCEUR CLEAR (A) 05/04/2016 1850   APPEARANCEUR Clear 05/03/2014 0503   LABSPEC 1.010 05/04/2016 1850   LABSPEC 1.015 05/03/2014 0503   PHURINE 5.0 05/04/2016 1850   GLUCOSEU NEGATIVE  05/04/2016 1850   GLUCOSEU Negative 05/03/2014 0503   HGBUR 3+ (A) 05/04/2016 1850  BILIRUBINUR NEGATIVE 05/04/2016 1850   BILIRUBINUR Negative 05/03/2014 0503   KETONESUR NEGATIVE 05/04/2016 1850   PROTEINUR 100 (A) 05/04/2016 1850   UROBILINOGEN 0.2 09/03/2012 1355   NITRITE NEGATIVE 05/04/2016 1850   LEUKOCYTESUR NEGATIVE 05/04/2016 1850   LEUKOCYTESUR Negative 05/03/2014 0503    Radiological Exams on Admission: No results found.  EKG: Independently reviewed. qtc 545. No peaked t waves.  Assessment/Plan Principal Problem:   Hyperkalemia Active Problems:   Diabetes mellitus (HCC)   Hypertension   S/P CABG x 4   Cerebral embolism with cerebral infarction (HCC)   CAD (coronary artery disease)   Thyroid disease   HTN, goal below 140/80   CKD (chronic kidney disease) stage 5, GFR less than 15 ml/min (HCC)   Carotid stenosis   # CKD5 - cr 1.9 01/2020, then 4s in August of this year, then 5.7 last week with pcp. Has been referred to nephrology but has not established. Did have IR contrast study 9/13 for evaluation of carotid stenosis. Does not appear uremic, hyperkalemia is subacute and not severe, bicarb is normal, is making urine and does not appear fluid overloaded, do not see indication for urgent dialysis. - 1 L LR ordered in ED, will f/u kidney function - nephrology consult in AM  # Normocytic anemia - suspect 2/2 severe ckd. Had normal hemoglobin 11/2019. Stool guiac normal in the ED, pt denies melena or other bleeding - f/u iron studies to assess for iron deficiency  # Hyperkalemia - K 5.6 today, was 5.7 on 9/20 so somewhat chronic and stable. qtc prolonged, no other ekg changes - 1 L LR running - continue daily patiromer - would give insulin if up-trends - tele - repeat bmp at 22:00 and again in AM  # CAD - s/p 4 vessel cabg. Asymptomatic - cont home aspirin, metoprolol, plavix - hold home lasix  # CVA - recent (04/2020) with residual right arm weakness. No new  symptoms - cont asa/plavix  # Carotid stenosis - recent IR procedure showed 30-40% left ICA stenosis - outpt f/u, was told doesn't need intervention  # T2DM - here glucose mildly elevated - hold home glimepiride, would hold on d/c given advanced ckd - SSI  # chronic pain and anxiety - hold home norco/alprazolam prn but reduce dose norco given GFR  # HTN - here mild/mod elevation - resume home hydral, hold home lasix  # Hypothyroid - cont home levothyroxine  DVT prophylaxis: heparin Code Status: full  Family Communication: husband bud updated at bedside  Disposition Plan: likelly home  Consults called: none  Admission status: med/surg tele    Desma Maxim MD [facility] Pager [phone]  If 7PM-7AM, please contact night-coverage [email or web address] Password [facility]  06/22/2020, 5:47 PM

## 2020-06-22 NOTE — ED Notes (Signed)
Sent rainbow to lab. 

## 2020-06-23 ENCOUNTER — Inpatient Hospital Stay: Payer: Medicare Other

## 2020-06-23 ENCOUNTER — Inpatient Hospital Stay (HOSPITAL_COMMUNITY)
Admit: 2020-06-23 | Discharge: 2020-06-23 | Disposition: A | Discharge status: Home / Self Care | Payer: Medicare Other | Attending: Nephrology | Admitting: Nephrology

## 2020-06-23 DIAGNOSIS — I776 Arteritis, unspecified: Secondary | ICD-10-CM

## 2020-06-23 DIAGNOSIS — N059 Unspecified nephritic syndrome with unspecified morphologic changes: Secondary | ICD-10-CM

## 2020-06-23 DIAGNOSIS — I5031 Acute diastolic (congestive) heart failure: Secondary | ICD-10-CM

## 2020-06-23 DIAGNOSIS — N185 Chronic kidney disease, stage 5: Secondary | ICD-10-CM

## 2020-06-23 LAB — GLUCOSE, CAPILLARY
Glucose-Capillary: 124 mg/dL — ABNORMAL HIGH (ref 70–99)
Glucose-Capillary: 122 mg/dL — ABNORMAL HIGH (ref 70–99)
Glucose-Capillary: 178 mg/dL — ABNORMAL HIGH (ref 70–99)
Glucose-Capillary: 262 mg/dL — ABNORMAL HIGH (ref 70–99)

## 2020-06-23 LAB — URINALYSIS, COMPLETE (UACMP) WITH MICROSCOPIC
RBC / HPF: >50 RBC/hpf — ABNORMAL HIGH (ref 0–5)
Specific Gravity, Urine: 1.007 (ref 1.005–1.030)
Squamous Epithelial / HPF: NONE SEEN (ref 0–5)
WBC, UA: >50 WBC/hpf — ABNORMAL HIGH (ref 0–5)

## 2020-06-23 LAB — BASIC METABOLIC PANEL
Anion gap: 12 (ref 5–15)
Anion gap: 11 (ref 5–15)
BUN: 58 mg/dL — ABNORMAL HIGH (ref 8–23)
BUN: 59 mg/dL — ABNORMAL HIGH (ref 8–23)
CO2: 24 mmol/L (ref 22–32)
CO2: 23 mmol/L (ref 22–32)
Calcium: 7.8 mg/dL — ABNORMAL LOW (ref 8.9–10.3)
Calcium: 8 mg/dL — ABNORMAL LOW (ref 8.9–10.3)
Chloride: 102 mmol/L (ref 98–111)
Chloride: 103 mmol/L (ref 98–111)
Creatinine, Ser: 4.86 mg/dL — ABNORMAL HIGH (ref 0.44–1.00)
Creatinine, Ser: 4.59 mg/dL — ABNORMAL HIGH (ref 0.44–1.00)
GFR calc Af Amer: 9 mL/min — ABNORMAL LOW (ref >60)
GFR calc Af Amer: 10 mL/min — ABNORMAL LOW (ref >60)
GFR calc non Af Amer: 8 mL/min — ABNORMAL LOW (ref >60)
GFR calc non Af Amer: 8 mL/min — ABNORMAL LOW (ref >60)
Glucose, Bld: 145 mg/dL — ABNORMAL HIGH (ref 70–99)
Glucose, Bld: 150 mg/dL — ABNORMAL HIGH (ref 70–99)
Potassium: 4.7 mmol/L (ref 3.5–5.1)
Potassium: 4.8 mmol/L (ref 3.5–5.1)
Sodium: 138 mmol/L (ref 135–145)
Sodium: 137 mmol/L (ref 135–145)

## 2020-06-23 LAB — HEMOGLOBIN A1C
Hgb A1c MFr Bld: 5.4 % (ref 4.8–5.6)
Mean Plasma Glucose: 108.28 mg/dL

## 2020-06-23 LAB — CBC
HCT: 25.8 % — ABNORMAL LOW (ref 36.0–46.0)
Hemoglobin: 8.6 g/dL — ABNORMAL LOW (ref 12.0–15.0)
MCH: 29.5 pg (ref 26.0–34.0)
MCHC: 33.3 g/dL (ref 30.0–36.0)
MCV: 88.4 fL (ref 80.0–100.0)
Platelets: 169 10*3/uL (ref 150–400)
RBC: 2.92 MIL/uL — ABNORMAL LOW (ref 3.87–5.11)
RDW: 15.6 % — ABNORMAL HIGH (ref 11.5–15.5)
WBC: 4.8 10*3/uL (ref 4.0–10.5)
nRBC: 0 % (ref 0.0–0.2)

## 2020-06-23 LAB — HEPATITIS B SURFACE ANTIGEN: Hepatitis B Surface Ag: NONREACTIVE

## 2020-06-23 LAB — HEPATITIS C ANTIBODY: HCV Ab: NONREACTIVE

## 2020-06-23 LAB — HIV ANTIBODY (ROUTINE TESTING W REFLEX): HIV Screen 4th Generation wRfx: NONREACTIVE

## 2020-06-23 LAB — BRAIN NATRIURETIC PEPTIDE: B Natriuretic Peptide: 488.2 pg/mL — ABNORMAL HIGH (ref 0.0–100.0)

## 2020-06-23 LAB — HEPATITIS B SURFACE ANTIBODY,QUALITATIVE: Hep B S Ab: NONREACTIVE

## 2020-06-23 LAB — PROTIME-INR
INR: 1 (ref 0.8–1.2)
Prothrombin Time: 13.2 seconds (ref 11.4–15.2)

## 2020-06-23 LAB — CK: Total CK: 26 U/L — ABNORMAL LOW (ref 38–234)

## 2020-06-23 LAB — HEPATITIS B CORE ANTIBODY, IGM: Hep B C IgM: NONREACTIVE

## 2020-06-23 LAB — TSH: TSH: 3.275 u[IU]/mL (ref 0.350–4.500)

## 2020-06-23 MED ORDER — SALINE SPRAY 0.65 % NA SOLN
1.0000 | NASAL | Status: DC | PRN
  Filled 2020-06-23: qty 44

## 2020-06-23 MED ORDER — METHYLPREDNISOLONE SODIUM SUCC 125 MG IJ SOLR
125.0000 mg | Freq: Four times a day (QID) | INTRAMUSCULAR | Status: AC
  Administered 2020-06-23 (×3): 125 mg via INTRAVENOUS
  Filled 2020-06-23 (×3): qty 2

## 2020-06-23 MED ORDER — ONDANSETRON 8 MG PO TBDP
8.0000 mg | ORAL_TABLET | Freq: Three times a day (TID) | ORAL | Status: DC | PRN
  Administered 2020-06-23 – 2020-06-25 (×2): 8 mg via ORAL
  Filled 2020-06-23 (×3): qty 1

## 2020-06-23 MED ORDER — METHYLPREDNISOLONE SODIUM SUCC 125 MG IJ SOLR: 125.0000 mg | Freq: Four times a day (QID) | INTRAMUSCULAR | Status: DC

## 2020-06-23 MED ORDER — MELATONIN 5 MG PO TABS
5.0000 mg | ORAL_TABLET | Freq: Every day | ORAL | Status: DC
  Administered 2020-06-23 – 2020-07-01 (×9): 5 mg via ORAL
  Filled 2020-06-23 (×9): qty 1

## 2020-06-23 MED ORDER — BUMETANIDE 0.25 MG/ML IJ SOLN
0.5000 mg | Freq: Once | INTRAMUSCULAR | Status: AC
  Administered 2020-06-23: 0.5 mg via INTRAVENOUS
  Filled 2020-06-23: qty 4

## 2020-06-23 NOTE — Progress Notes (Signed)
PROGRESS NOTE    [name]  WUJ:[id number] DOB: 09-10-1934 DOA: 06/22/2020 PCP: Idelle Crouch, MD   Assessment & Plan:   Principal Problem:   Hyperkalemia Active Problems:   Diabetes mellitus ([place])   Hypertension   S/P CABG x 4   Cerebral embolism with cerebral infarction (HCC)   CAD (coronary artery disease)   Thyroid disease   HTN, goal below 140/80   CKD (chronic kidney disease) stage 5, GFR less than 15 ml/min (HCC)   Carotid stenosis   Prolonged QT interval   CKDV: w/ hematuria. 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 Cr is trending down slightly from day prior.  W/ hx of ANCA pauci-immune glomerulonephritis. Will need a kidney biopsy and started on stress dose steroids as per nephro. Nephro following and recs apprec   ACD: likely secondary to CKD & hematuria. No need for a transfusion at this time. Will continue to monitor   Hx of ANCA vasculitis: not on any treatments outpatient as per med rec  Hyperkalemia: likely secondary to CKD. Resolved  CAD: s/p CABG. Continue on home dose of aspirin, metoprolol, plavix  Hx of CVA: recent July 2021 w/ residual right arm weakness. Hold aspirin, plavix as per nephro   Carotid stenosis: recent IR procedure showed 30-40% left ICA stenosis, no acute intervention needed  DM2: continue to hold home dose of glimepiride. Continue on SSI w/ accuchecks   Chronic pain: hold home dose of norco  Anxiety: severity unknown. 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 Continue to hold home dose of lasix secondary CKD   Hypothyroidism: continue on home dose of levothyroxine      DVT prophylaxis: heparin SQ Code Status: full Family Communication:  Disposition Plan: depends on PT/OT recs  Status is: Inpatient  Remains inpatient appropriate because:Ongoing diagnostic testing needed not appropriate for outpatient work up, IV treatments appropriate due to  intensity of illness or inability to take PO and Inpatient level of care appropriate due to severity of illness   Dispo: The patient is from: Home              Anticipated d/c is to: Home vs SNF              Anticipated d/c date is: > 3 days              Patient currently is not medically stable to d/c.        Consultants:   nephro    Procedures:   Antimicrobials:    Subjective: Pt c/o malaise  Objective: Vitals:   06/23/20 0354 06/23/20 0417 06/23/20 0418 06/23/20 0745  BP: (!) 180/88   (!) 177/89  Pulse: 91 95 94 84  Resp: 18   17  Temp:    97.6 F (36.4 C)  TempSrc:    Oral  SpO2:  92% 97% 94%    Intake/Output Summary (Last 24 hours) at 06/23/2020 0834 Last data filed at 06/23/2020 0150 Gross per 24 hour  Intake 1000 ml  Output 550 ml  Net 450 ml   There were no vitals filed for this visit.  Examination:  General exam: Appears calm and comfortable  Respiratory system: Clear to auscultation. No wheezes, rales  Cardiovascular system: S1 & S2 +. No rubs, gallops or clicks.  Gastrointestinal system: Abdomen is nondistended, soft and nontender. Normal bowel sounds heard. Central nervous system: Alert and oriented. Moves all 4 extremities  Psychiatry: Judgement and insight appear  normal. Flat mood and affect     Data Reviewed: I have personally reviewed following labs and imaging studies  CBC: Recent Labs  Lab 06/22/20 1412 06/23/20 0444  WBC 7.2 4.8  HGB 8.2* 8.6*  HCT 25.5* 25.8*  MCV 90.1 88.4  PLT 181 203   Basic Metabolic Panel: Recent Labs  Lab 06/22/20 1412 06/22/20 2326 06/23/20 0444  NA 135 138 137  K 5.6* 4.7 4.8  CL 100 102 103  CO2 25 24 23   GLUCOSE 161* 145* 150*  BUN 64* 58* 59*  CREATININE 5.15* 4.86* 4.59*  CALCIUM 7.6* 7.8* 8.0*   GFR: Estimated Creatinine Clearance: 10.3 mL/min (A) (by C-G formula based on SCr of 4.59 mg/dL (H)). Liver Function Tests: Recent Labs  Lab 06/22/20 1412  AST 10*  ALT 6  ALKPHOS 78   BILITOT 0.7  PROT 7.9  ALBUMIN 3.1*   No results for input(s): LIPASE, AMYLASE in the last 168 hours. No results for input(s): AMMONIA in the last 168 hours. Coagulation Profile: Recent Labs  Lab 06/23/20 0444  INR 1.0   Cardiac Enzymes: No results for input(s): CKTOTAL, CKMB, CKMBINDEX, TROPONINI in the last 168 hours. BNP (last 3 results) No results for input(s): PROBNP in the last 8760 hours. HbA1C: Recent Labs    06/22/20 1412  HGBA1C 5.3   CBG: Recent Labs  Lab 06/22/20 1941 06/23/20 0746  GLUCAP 105* 124*   Lipid Profile: No results for input(s): CHOL, HDL, LDLCALC, TRIG, CHOLHDL, LDLDIRECT in the last 72 hours. Thyroid Function Tests: Recent Labs    06/23/20 0444  TSH 3.275   Anemia Panel: Recent Labs    06/22/20 1412  FERRITIN 56  TIBC 302  IRON 33   Sepsis Labs: No results for input(s): PROCALCITON, LATICACIDVEN in the last 168 hours.  Recent Results (from the past 240 hour(s))  SARS Coronavirus 2 by RT PCR (hospital order, performed in [facility] hospital lab) Nasopharyngeal Nasopharyngeal Swab     Status: None   Collection Time: 06/22/20  4:33 PM   Specimen: Nasopharyngeal Swab  Result Value Ref Range Status   SARS Coronavirus 2 NEGATIVE NEGATIVE Final    Comment: (NOTE) SARS-CoV-2 target nucleic acids are NOT DETECTED.  The SARS-CoV-2 RNA is generally detectable in upper and lower respiratory specimens during the acute phase of infection. The lowest concentration of SARS-CoV-2 viral copies this assay can detect is 250 copies / mL. A negative result does not preclude SARS-CoV-2 infection and should not be used as the sole basis for treatment or other patient management decisions.  A negative result may occur with improper specimen collection / handling, submission of specimen other than nasopharyngeal swab, presence of viral mutation(s) within the areas targeted by this assay, and inadequate number of viral copies (<250 copies / mL). A  negative result must be combined with clinical observations, patient history, and epidemiological information.  Fact Sheet for Patients:   StrictlyIdeas.no  Fact Sheet for Healthcare Providers: BankingDealers.co.za  This test is not yet approved or  cleared by the [place] FDA and has been authorized for detection and/or diagnosis of SARS-CoV-2 by FDA under an Emergency Use Authorization (EUA).  This EUA will remain in effect (meaning this test can be used) for the duration of the COVID-19 declaration under Section 564(b)(1) of the Act, 21 U.S.C. section 360bbb-3(b)(1), unless the authorization is terminated or revoked sooner.  Performed at [facility], [address] [place], [place] [address]  Radiology Studies: No results found.      Scheduled Meds: . aspirin  81 mg Oral Daily  . docusate sodium  200 mg Oral Daily  . heparin  5,000 Units Subcutaneous Q8H  . hydrALAZINE  25 mg Oral TID  . insulin aspart  0-9 Units Subcutaneous TID WC  . levothyroxine  150 mcg Oral QAC breakfast  . metoprolol tartrate  50 mg Oral Q8H  . patiromer  8.4 g Oral Once  . patiromer  8.4 g Oral Daily  . sodium chloride flush  3 mL Intravenous Q12H   Continuous Infusions: . sodium chloride       LOS: 1 day    Time spent: 32 mins     Wyvonnia Dusky, MD [facility] Pager [phone]  If 7PM-7AM, please contact night-coverage [email or web address] 06/23/2020, 8:34 AM

## 2020-06-23 NOTE — Consult Note (Signed)
[facility]  CONSULT NOTE    Date: 06/23/2020                  Patient Name:  [name]  MRN: [id number]  DOB: [birth year]  Age / Sex: 86 y.o., female         PCP: Idelle Crouch, MD                 Service Requesting Consult: Dr. Jimmye Norman                 Reason for Consult: Acute renal failure            History of Present Illness: [name] presents with her husband at bedside. Patient and husband give limited history. Patient has been lost to nephrology follow up. Last seen in our office on 05/15/19 by my partner, Dr. Murlean Iba. At that time, creatinine was 1.56, GFR of 31 and spot urine protein to creatinine ratio of 456mg .  Patient had a kidney biopsy on 05/08/2017 by me, Dr. Lavonia Dana. Which found patient to have pauci-immune glomerulonephritis with crescents. She was treated at that time with high dose glucocorticoids. She did not get rituximab or cyclophosphamide at that time. It was thought that her vasculitis was drug induced from her hydralazine. This was discontinued at that time.  However patient states that she is still taking that and in fact, she has received hydralazine this admission.  She did not have any maintenance immunosuppressive medications.   Patient states she has had nasal congestion for over 10 years.   Patient was found to have creatinine of 4.4 on 8/23. Nephrology outpatient referral was sent, she has an appointment with our office in October. However repeat labs on 9/20 show creatinine of 5.7 and potassium of 5.4. She was asked to be admitted for further work up.   Patient found to have gross hematuria with red urine and frothy urine. Patient complains of cough and shortness of breath. She also complains of orthopnea.   On 9/13, patient underwent left carotid catheter placed by Dr. Lucky Cowboy. IV dye exposure at this time.   Medications: Outpatient medications: Medications Prior to Admission  Medication Sig  Dispense Refill Last Dose  . ALPRAZolam (XANAX) 0.5 MG tablet Take 0.5 mg daily as needed by mouth for anxiety.      Marland Kitchen aspirin 81 MG chewable tablet Chew 81 mg by mouth daily.     . butalbital-acetaminophen-caffeine (ESGIC) 50-325-40 MG tablet Take 1 tablet daily as needed by mouth for headache or migraine.     . Calcium Carbonate-Vitamin D (CALCIUM 600+D PO) Take 1 tablet daily by mouth.     . clopidogrel (PLAVIX) 75 MG tablet Take by mouth.     . Cyanocobalamin 1000 MCG SUBL Take by mouth.     . docusate sodium (COLACE) 250 MG capsule Take 250 mg daily by mouth.     . furosemide (LASIX) 40 MG tablet Take 40 mg daily as needed by mouth for fluid or edema.      . gabapentin (NEURONTIN) 100 MG capsule Take 100 mg by mouth 2 (two) times daily.     Marland Kitchen glimepiride (AMARYL) 4 MG tablet Take 4 mg by mouth 2 (two) times daily. If blood sugar is over 200 take 4 mgs twice daily for 30 days      . hydrALAZINE (APRESOLINE) 25 MG tablet Take 25 mg 3 (three) times daily by mouth.     Marland Kitchen  HYDROcodone-acetaminophen (NORCO/VICODIN) 5-325 MG tablet Take 1-2 tablets by mouth every 4 (four) hours as needed for moderate pain. 30 tablet 0   . levothyroxine (SYNTHROID, LEVOTHROID) 150 MCG tablet Take 150 mcg daily before breakfast by mouth.     . metoprolol (LOPRESSOR) 50 MG tablet Take 1 tablet (50 mg total) by mouth every 8 (eight) hours. 90 tablet 5   . Multiple Vitamins-Minerals (ICAPS AREDS 2) CAPS Take 1 capsule 2 (two) times daily by mouth.     . Naphazoline HCl (CLEAR EYES OP) Apply 1 drop daily as needed to eye (dry eyes).     . ondansetron (ZOFRAN ODT) 4 MG disintegrating tablet Take 1 tablet (4 mg total) by mouth every 8 (eight) hours as needed for nausea or vomiting. 20 tablet 0   . promethazine (PHENERGAN) 12.5 MG tablet Take 1 tablet (12.5 mg total) by mouth every 6 (six) hours as needed for nausea or vomiting. 30 tablet 0     Current medications: Current Facility-Administered Medications  Medication  Dose Route Frequency Provider Last Rate Last Admin  . 0.9 %  sodium chloride infusion  250 mL Intravenous PRN Wouk, Ailene Rud, MD      . ALPRAZolam Duanne Moron) tablet 0.5 mg  0.5 mg Oral Daily PRN Gwynne Edinger, MD   0.5 mg at 06/23/20 0425  . aspirin chewable tablet 81 mg  81 mg Oral Daily Gwynne Edinger, MD   81 mg at 06/23/20 [id number]  . bumetanide (BUMEX) injection 0.5 mg  0.5 mg Intravenous Once Dianelly Ferran, MD      . docusate sodium (COLACE) capsule 200 mg  200 mg Oral Daily Gwynne Edinger, MD   200 mg at 06/23/20 0837  . heparin injection 5,000 Units  5,000 Units Subcutaneous Q8H Gwynne Edinger, MD   5,000 Units at 06/23/20 [phone]  . hydrALAZINE (APRESOLINE) tablet 25 mg  25 mg Oral TID Gwynne Edinger, MD   25 mg at 06/23/20 0836  . HYDROcodone-acetaminophen (NORCO/VICODIN) 5-325 MG per tablet 0.5 tablet  0.5 tablet Oral Q6H PRN Gwynne Edinger, MD   0.5 tablet at 06/23/20 0424  . insulin aspart (novoLOG) injection 0-9 Units  0-9 Units Subcutaneous TID WC Wouk, Ailene Rud, MD   1 Units at 06/23/20 [phone]  . levothyroxine (SYNTHROID) tablet 150 mcg  150 mcg Oral QAC breakfast Gwynne Edinger, MD   150 mcg at 06/23/20 [phone]  . methylPREDNISolone sodium succinate (SOLU-MEDROL) 125 mg/2 mL injection 125 mg  125 mg Intravenous Q6H Marca Gadsby, MD      . metoprolol tartrate (LOPRESSOR) tablet 50 mg  50 mg Oral Q8H Wouk, Ailene Rud, MD   50 mg at 06/23/20 0425  . patiromer Daryll Drown) packet 8.4 g  8.4 g Oral Once Wouk, Ailene Rud, MD      . patiromer [facility]) packet 8.4 g  8.4 g Oral Daily Wouk, Ailene Rud, MD      . sodium chloride (OCEAN) 0.65 % nasal spray 1 spray  1 spray Each Nare PRN Lang Snow, NP      . sodium chloride flush (NS) 0.9 % injection 3 mL  3 mL Intravenous Q12H Wouk, Ailene Rud, MD   3 mL at 06/23/20 [phone]  . sodium chloride flush (NS) 0.9 % injection 3 mL  3 mL Intravenous PRN Wouk, Ailene Rud, MD           Allergies: Allergies  Allergen Reactions  . Clonidine Other (See Comments) and Shortness Of  Breath  . Darvocet [Propoxyphene N-Acetaminophen] Anaphylaxis  . Duratuss G [Guaifenesin]     Other reaction(s): Unknown  . Ketek  [Telithromycin]     Other reaction(s): Unknown  . Ciprocinonide [Fluocinolone] Other (See Comments)    Feels crazy  . Ivp Dye [Iodinated Diagnostic Agents] Nausea And Vomiting and Other (See Comments)    Burning  . Levaquin [Levofloxacin] Nausea Only  . Statins Other (See Comments)    Myalgia      Past Medical History: Past Medical History:  Diagnosis Date  . Angina pectoris ([place]) 08/26/2012  . Arthritis   . Breast cancer ([place]) 2012   right breast  . Breast mass, right   . Cancer [facility])    breast cancer, right side 2012  . Chronic kidney disease    RENAL INSUFF  . Complication of anesthesia   . Coronary artery disease 08/22/2012   sees Dr Rockey Situ  . Diabetes ([place])   . Dyspnea    ON EXERTION  . Gastritis    hx of  . GERD (gastroesophageal reflux disease)   . Headache(784.0)    migraines  . History of breast cancer    39 treatments of radiation. Negative chemo.  Marland Kitchen History of seasonal allergies   . Hyperlipemia   . Hypertension    sees Dr. Fulton Reek  . Hypothyroidism   . Macular degeneration    Bilateral  . Neuropathy   . Personal history of radiation therapy   . Pneumonia    hx of  . PONV (postoperative nausea and vomiting)   . 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 Stroke ([place])    2012  . Thyroid disease   . Vertigo      Past Surgical History: Past Surgical History:  Procedure Laterality Date  . ABDOMINAL HYSTERECTOMY    . APPENDECTOMY    . BACK SURGERY    . BREAST BIOPSY Right    2012 positive- [facility]  . BREAST LUMPECTOMY Right 2012   F/U radiation   . BREAST SURGERY    . CARDIAC CATHETERIZATION  2014  . CAROTID PTA/STENT INTERVENTION Left 06/14/2020    Procedure: CAROTID PTA/STENT INTERVENTION;  Surgeon: Algernon Huxley, MD;  Location: [place] [facility];  Service: Cardiovascular;  Laterality: Left;  . CATARACT EXTRACTION W/PHACO Right 08/14/2017   Procedure: CATARACT EXTRACTION PHACO AND INTRAOCULAR LENS PLACEMENT (IOC);  Surgeon: Birder Robson, MD;  Location: [facility];  Service: Ophthalmology;  Laterality: Right;  [place] 00:43.0 AP% 17.1 CDE 7.37 Fluid Pack lot # [id number]  . CATARACT EXTRACTION W/PHACO Left 10/09/2017   Procedure: CATARACT EXTRACTION PHACO AND INTRAOCULAR LENS PLACEMENT ([place]);  Surgeon: Birder Robson, MD;  Location: [facility];  Service: Ophthalmology;  Laterality: Left;  [place] 00:30.3 AP% 11.0 CDE 3.33 Fluid Pack Lot # [id number]  . CORONARY ARTERY BYPASS GRAFT  09/05/2012   Procedure: CORONARY ARTERY BYPASS GRAFTING (CABG);  Surgeon: Rexene Alberts, MD;  Location: [place];  Service: Open Heart Surgery;  Laterality: N/A;  CABG x four, using left internal mammary artery and bilateral greater saphenous vein harvested endoscopically  . INTRAOPERATIVE TRANSESOPHAGEAL ECHOCARDIOGRAM  09/05/2012   Procedure: INTRAOPERATIVE TRANSESOPHAGEAL ECHOCARDIOGRAM;  Surgeon: Rexene Alberts, MD;  Location: [place];  Service: Open Heart Surgery;  Laterality: N/A;  . KNEE SURGERY     Bilateral nerve block  . LAPAROSCOPIC NISSEN FUNDOPLICATION    . OVARIAN CYST REMOVAL  Family History: Family History  Problem Relation Age of Onset  . Heart disease Brother        CABG & stents  . Hyperlipidemia Brother   . Hypertension Brother   . Cancer Father   . Heart disease Mother   . Breast cancer Cousin   . Kidney disease Neg Hx      Social History: Social History   Socioeconomic History  . Marital status: Married    Spouse name: Not on file  . Number of children: 0  . Years of education: Not on file  . Highest education level: Not on file  Occupational History  . Occupation: retired  Tobacco Use  . Smoking status: Former Smoker     Types: Cigarettes    Quit date: 11/13/1988    Years since quitting: 31.6  . Smokeless tobacco: Never Used  Substance and Sexual Activity  . Alcohol use: No  . Drug use: No  . Sexual activity: Not on file  Other Topics Concern  . Not on file  Social History Narrative  . Not on file   Social Determinants of Health   Financial Resource Strain:   . Difficulty of Paying Living Expenses: Not on file  Food Insecurity:   . Worried About Charity fundraiser in the Last Year: Not on file  . Ran Out of Food in the Last Year: Not on file  Transportation Needs:   . Lack of Transportation (Medical): Not on file  . Lack of Transportation (Non-Medical): Not on file  Physical Activity:   . Days of Exercise per Week: Not on file  . Minutes of Exercise per Session: Not on file  Stress:   . Feeling of Stress : Not on file  Social Connections:   . Frequency of Communication with Friends and Family: Not on file  . Frequency of Social Gatherings with Friends and Family: Not on file  . Attends Religious Services: Not on file  . Active Member of Clubs or Organizations: Not on file  . Attends Archivist Meetings: Not on file  . Marital Status: Not on file  Intimate Partner Violence:   . Fear of Current or Ex-Partner: Not on file  . Emotionally Abused: Not on file  . Physically Abused: Not on file  . Sexually Abused: Not on file     Review of Systems: Review of Systems  Constitutional: Negative.   HENT: Negative.   Eyes: Negative.   Respiratory: Positive for cough, sputum production, shortness of breath and wheezing. Negative for hemoptysis.   Cardiovascular: Positive for orthopnea and PND. Negative for chest pain, palpitations, claudication and leg swelling.  Gastrointestinal: Negative.   Genitourinary: Positive for hematuria. Negative for dysuria, flank pain, frequency and urgency.  Musculoskeletal: Negative for back pain, falls, joint pain, myalgias and neck pain.  Skin:  Negative.   Neurological: Negative.   Endo/Heme/Allergies: Negative.   Psychiatric/Behavioral: Negative.     Vital Signs: Blood pressure (!) 177/89, pulse 84, temperature 97.6 F (36.4 C), temperature source Oral, resp. rate 17, SpO2 94 %.  Weight trends: There were no vitals filed for this visit.  Physical Exam: General: NAD, laying in bed   Head: Normocephalic, atraumatic. Moist oral mucosal membranes  Eyes: Anicteric, PERRL  Neck: Supple, trachea midline  Lungs:  Bilateral crackles  Heart: Regular rate and rhythm  Abdomen:  Soft, nontender, obese  Extremities:  no peripheral edema.  Neurologic: Nonfocal, moving all four extremities  Skin: No lesions, +ecchymosis  GU  Red frothy urine  Access: None     Lab results: Basic Metabolic Panel: Recent Labs  Lab 06/22/20 1412 06/22/20 2326 06/23/20 0444  NA 135 138 137  K 5.6* 4.7 4.8  CL 100 102 103  CO2 25 24 23   GLUCOSE 161* 145* 150*  BUN 64* 58* 59*  CREATININE 5.15* 4.86* 4.59*  CALCIUM 7.6* 7.8* 8.0*    Liver Function Tests: Recent Labs  Lab 06/22/20 1412  AST 10*  ALT 6  ALKPHOS 78  BILITOT 0.7  PROT 7.9  ALBUMIN 3.1*   No results for input(s): LIPASE, AMYLASE in the last 168 hours. No results for input(s): AMMONIA in the last 168 hours.  CBC: Recent Labs  Lab 06/22/20 1412 06/23/20 0444  WBC 7.2 4.8  HGB 8.2* 8.6*  HCT 25.5* 25.8*  MCV 90.1 88.4  PLT 181 169    Cardiac Enzymes: No results for input(s): CKTOTAL, CKMB, CKMBINDEX, TROPONINI in the last 168 hours.  BNP: Invalid input(s): POCBNP  CBG: Recent Labs  Lab 06/22/20 1941 06/23/20 0746  GLUCAP 105* 124*    Microbiology: Results for orders placed or performed during the hospital encounter of 06/22/20  SARS Coronavirus 2 by RT PCR (hospital order, performed in [facility] hospital lab) Nasopharyngeal Nasopharyngeal Swab     Status: None   Collection Time: 06/22/20  4:33 PM   Specimen: Nasopharyngeal Swab  Result Value Ref  Range Status   SARS Coronavirus 2 NEGATIVE NEGATIVE Final    Comment: (NOTE) SARS-CoV-2 target nucleic acids are NOT DETECTED.  The SARS-CoV-2 RNA is generally detectable in upper and lower respiratory specimens during the acute phase of infection. The lowest concentration of SARS-CoV-2 viral copies this assay can detect is 250 copies / mL. A negative result does not preclude SARS-CoV-2 infection and should not be used as the sole basis for treatment or other patient management decisions.  A negative result may occur with improper specimen collection / handling, submission of specimen other than nasopharyngeal swab, presence of viral mutation(s) within the areas targeted by this assay, and inadequate number of viral copies (<250 copies / mL). A negative result must be combined with clinical observations, patient history, and epidemiological information.  Fact Sheet for Patients:   StrictlyIdeas.no  Fact Sheet for Healthcare Providers: BankingDealers.co.za  This test is not yet approved or  cleared by the [place] FDA and has been authorized for detection and/or diagnosis of SARS-CoV-2 by FDA under an Emergency Use Authorization (EUA).  This EUA will remain in effect (meaning this test can be used) for the duration of the COVID-19 declaration under Section 564(b)(1) of the Act, 21 U.S.C. section 360bbb-3(b)(1), unless the authorization is terminated or revoked sooner.  Performed at [facility], [address] [place], [place] [address]     Coagulation Studies: Recent Labs    06/23/20 0444  LABPROT 13.2  INR 1.0    Urinalysis: Recent Labs    06/23/20 1025  COLORURINE RED*  LABSPEC 1.007  PHURINE TEST NOT REPORTED DUE TO COLOR INTERFERENCE OF URINE PIGMENT  GLUCOSEU TEST NOT REPORTED DUE TO COLOR INTERFERENCE OF URINE PIGMENT*  HGBUR TEST NOT REPORTED DUE TO COLOR INTERFERENCE OF URINE PIGMENT*   BILIRUBINUR TEST NOT REPORTED DUE TO COLOR INTERFERENCE OF URINE PIGMENT*  KETONESUR TEST NOT REPORTED DUE TO COLOR INTERFERENCE OF URINE PIGMENT*  PROTEINUR TEST NOT REPORTED DUE TO COLOR INTERFERENCE OF URINE PIGMENT*  NITRITE TEST NOT REPORTED DUE TO COLOR INTERFERENCE OF URINE PIGMENT*  LEUKOCYTESUR  TEST NOT REPORTED DUE TO COLOR INTERFERENCE OF URINE PIGMENT*      Imaging:  No results found.   Assessment & Plan: [name] is a 86 y.o. white female with history of ANCA vasculitis, coronary artery disease status post CABG, diabetes mellitus type II, hypertension, hyperlipidemia, GERD, hypothyroidism, CVA, macular degeneration, diabetic neuropathy, history of breast cancer, allergies, carotid stenosis who was admitted to [facility] on 06/22/2020 for Hyperkalemia [E87.5] AKI (acute kidney injury) ([place]) [N17.9] Anemia, unspecified type [D64.9]  1. Acute renal failure on chronic kidney disease stage IIIB. Baseline creatinine of 1.4, GFR of 36 on 2/1 2. Hematuria 3. Proteinuria: with history m-spike 4. History of ANCA pauci-immune glomerulonephritis 5. Hyperkalemia   Impression Clinically, this seems like progression of glomerulonephritis. Creatinine is greater than 4.  If pulmonary hemorrhage present, will need plasmapheresis at tertiary care center.  - Discontinue hydralazine, there is concern that this is hydralazine induced vasculitis - Stress dose steroids - Check serologic work up. - hold anti-platelet therapy. May need to consult vascular.  -  Schedule kidney biopsy     LOS: 1 Versa Craton 9/22/202111:37 AM

## 2020-06-24 ENCOUNTER — Other Ambulatory Visit (INDEPENDENT_AMBULATORY_CARE_PROVIDER_SITE_OTHER): Payer: Self-pay | Admitting: Vascular Surgery

## 2020-06-24 DIAGNOSIS — E875 Hyperkalemia: Secondary | ICD-10-CM

## 2020-06-24 DIAGNOSIS — N179 Acute kidney failure, unspecified: Principal | ICD-10-CM

## 2020-06-24 LAB — ECHOCARDIOGRAM COMPLETE
Area-P 1/2: 3.42 cm2
S' Lateral: 2.94 cm

## 2020-06-24 LAB — CBC
HCT: 26.9 % — ABNORMAL LOW (ref 36.0–46.0)
Hemoglobin: 8.7 g/dL — ABNORMAL LOW (ref 12.0–15.0)
MCH: 28.8 pg (ref 26.0–34.0)
MCHC: 32.3 g/dL (ref 30.0–36.0)
MCV: 89.1 fL (ref 80.0–100.0)
Platelets: 199 10*3/uL (ref 150–400)
RBC: 3.02 MIL/uL — ABNORMAL LOW (ref 3.87–5.11)
RDW: 15.4 % (ref 11.5–15.5)
WBC: 10.6 10*3/uL — ABNORMAL HIGH (ref 4.0–10.5)
nRBC: 0 % (ref 0.0–0.2)

## 2020-06-24 LAB — BASIC METABOLIC PANEL
Anion gap: 10 (ref 5–15)
BUN: 70 mg/dL — ABNORMAL HIGH (ref 8–23)
CO2: 23 mmol/L (ref 22–32)
Calcium: 7.9 mg/dL — ABNORMAL LOW (ref 8.9–10.3)
Chloride: 99 mmol/L (ref 98–111)
Creatinine, Ser: 5.66 mg/dL — ABNORMAL HIGH (ref 0.44–1.00)
GFR calc Af Amer: 7 mL/min — ABNORMAL LOW (ref >60)
GFR calc non Af Amer: 6 mL/min — ABNORMAL LOW (ref >60)
Glucose, Bld: 207 mg/dL — ABNORMAL HIGH (ref 70–99)
Potassium: 5.5 mmol/L — ABNORMAL HIGH (ref 3.5–5.1)
Sodium: 132 mmol/L — ABNORMAL LOW (ref 135–145)

## 2020-06-24 LAB — RENAL FUNCTION PANEL
Albumin: 2.9 g/dL — ABNORMAL LOW (ref 3.5–5.0)
Anion gap: 11 (ref 5–15)
BUN: 73 mg/dL — ABNORMAL HIGH (ref 8–23)
CO2: 23 mmol/L (ref 22–32)
Calcium: 7.9 mg/dL — ABNORMAL LOW (ref 8.9–10.3)
Chloride: 99 mmol/L (ref 98–111)
Creatinine, Ser: 5.72 mg/dL — ABNORMAL HIGH (ref 0.44–1.00)
GFR calc Af Amer: 7 mL/min — ABNORMAL LOW (ref >60)
GFR calc non Af Amer: 6 mL/min — ABNORMAL LOW (ref >60)
Glucose, Bld: 209 mg/dL — ABNORMAL HIGH (ref 70–99)
Phosphorus: 6.1 mg/dL — ABNORMAL HIGH (ref 2.5–4.6)
Potassium: 5.5 mmol/L — ABNORMAL HIGH (ref 3.5–5.1)
Sodium: 133 mmol/L — ABNORMAL LOW (ref 135–145)

## 2020-06-24 LAB — ENA+DNA/DS+SJORGEN'S
ENA SM Ab Ser-aCnc: <0.2 AI (ref 0.0–0.9)
Ribonucleic Protein: <0.2 AI (ref 0.0–0.9)
SSA (Ro) (ENA) Antibody, IgG: <0.2 AI (ref 0.0–0.9)
SSB (La) (ENA) Antibody, IgG: <0.2 AI (ref 0.0–0.9)
ds DNA Ab: 3 IU/mL (ref 0–9)

## 2020-06-24 LAB — PROTEIN ELECTROPHORESIS, SERUM
A/G Ratio: 0.6 — ABNORMAL LOW (ref 0.7–1.7)
Albumin ELP: 3.1 g/dL (ref 2.9–4.4)
Alpha-1-Globulin: 0.3 g/dL (ref 0.0–0.4)
Alpha-2-Globulin: 1 g/dL (ref 0.4–1.0)
Beta Globulin: 0.7 g/dL (ref 0.7–1.3)
Gamma Globulin: 2.9 g/dL — ABNORMAL HIGH (ref 0.4–1.8)
Globulin, Total: 4.9 g/dL — ABNORMAL HIGH (ref 2.2–3.9)
M-Spike, %: 1.9 g/dL — ABNORMAL HIGH
Total Protein ELP: 8 g/dL (ref 6.0–8.5)

## 2020-06-24 LAB — ANA W/REFLEX: Anti Nuclear Antibody (ANA): POSITIVE — AB

## 2020-06-24 LAB — GLUCOSE, CAPILLARY
Glucose-Capillary: 205 mg/dL — ABNORMAL HIGH (ref 70–99)
Glucose-Capillary: 197 mg/dL — ABNORMAL HIGH (ref 70–99)
Glucose-Capillary: 148 mg/dL — ABNORMAL HIGH (ref 70–99)
Glucose-Capillary: 140 mg/dL — ABNORMAL HIGH (ref 70–99)

## 2020-06-24 LAB — GLOMERULAR BASEMENT MEMBRANE ANTIBODIES: GBM Ab: 2 units (ref 0–20)

## 2020-06-24 LAB — C4 COMPLEMENT: Complement C4, Body Fluid: 14 mg/dL (ref 12–38)

## 2020-06-24 LAB — C3 COMPLEMENT: C3 Complement: 60 mg/dL — ABNORMAL LOW (ref 82–167)

## 2020-06-24 LAB — KAPPA/LAMBDA LIGHT CHAINS
Kappa free light chain: 2196.6 mg/L — ABNORMAL HIGH (ref 3.3–19.4)
Kappa, lambda light chain ratio: 34.65 — ABNORMAL HIGH (ref 0.26–1.65)
Lambda free light chains: 63.4 mg/L — ABNORMAL HIGH (ref 5.7–26.3)

## 2020-06-24 MED ORDER — POLYVINYL ALCOHOL 1.4 % OP SOLN
1.0000 [drp] | OPHTHALMIC | Status: DC | PRN
  Administered 2020-06-24 – 2020-06-28 (×5): 1 [drp] via OPHTHALMIC
  Filled 2020-06-24 (×2): qty 15

## 2020-06-24 MED ORDER — SODIUM ZIRCONIUM CYCLOSILICATE 10 G PO PACK
10.0000 g | PACK | Freq: Once | ORAL | Status: AC
  Administered 2020-06-24: 10 g via ORAL
  Filled 2020-06-24: qty 1

## 2020-06-24 NOTE — Progress Notes (Addendum)
[facility]  ROUNDING NOTE   Subjective:  Patient resting in bed in no acute distress.Husband at the bedside. Denies nausea, vomiting, but reports anorexia.No SOB, breathing normally.   Objective:  Vital signs in last 24 hours:  Temp:  [97.7 F (36.5 C)-98.3 F (36.8 C)] 97.7 F (36.5 C) (09/23 0725) Pulse Rate:  [77-92] 77 (09/23 0725) Resp:  [18-19] 18 (09/23 0725) BP: (153-178)/(72-94) 177/73 (09/23 0725) SpO2:  [92 %-94 %] 94 % (09/23 0725)  Weight change:  There were no vitals filed for this visit.  Intake/Output: I/O last 3 completed shifts: In: -  Out: 1550 [Urine:1550]   Intake/Output this shift:  Total I/O In: 120 [P.O.:120] Out: 100 [Urine:100]  Physical Exam: General: Awake, resting in bed  Head: Normocephalic, atraumatic. Moist oral mucosal membranes  Eyes: Anicteric, PERRL  Neck: Supple, trachea midline  Lungs:  Clear to auscultation bilaterally  Heart: S1S2+Regular rate and rhythm  Abdomen:  Soft, nontender,   Extremities:  No  peripheral edema.  Neurologic: Nonfocal, moving all four extremities  Skin: No acute lesions or rashes  Access: To be established    Basic Metabolic Panel: Recent Labs  Lab 06/22/20 1412 06/22/20 1412 06/22/20 2326 06/23/20 0444 06/24/20 0740  NA 135  --  138 137 132*  133*  K 5.6*  --  4.7 4.8 5.5*  5.5*  CL 100  --  102 103 99  99  CO2 25  --  24 23 23  23   GLUCOSE 161*  --  145* 150* 207*  209*  BUN 64*  --  58* 59* 70*  73*  CREATININE 5.15*  --  4.86* 4.59* 5.66*  5.72*  CALCIUM 7.6*   < > 7.8* 8.0* 7.9*  7.9*  PHOS  --   --   --   --  6.1*   < > = values in this interval not displayed.    Liver Function Tests: Recent Labs  Lab 06/22/20 1412 06/24/20 0740  AST 10*  --   ALT 6  --   ALKPHOS 78  --   BILITOT 0.7  --   PROT 7.9  --   ALBUMIN 3.1* 2.9*   No results for input(s): LIPASE, AMYLASE in the last 168 hours. No results for input(s): AMMONIA in the last 168  hours.  CBC: Recent Labs  Lab 06/22/20 1412 06/23/20 0444 06/24/20 0740  WBC 7.2 4.8 10.6*  HGB 8.2* 8.6* 8.7*  HCT 25.5* 25.8* 26.9*  MCV 90.1 88.4 89.1  PLT 181 169 199    Cardiac Enzymes: Recent Labs  Lab 06/23/20 1156  CKTOTAL 26*    BNP: Invalid input(s): POCBNP  CBG: Recent Labs  Lab 06/23/20 1213 06/23/20 1605 06/23/20 2108 06/24/20 0727 06/24/20 1157  GLUCAP 122* 178* 262* 205* 197*    Microbiology: Results for orders placed or performed during the hospital encounter of 06/22/20  SARS Coronavirus 2 by RT PCR (hospital order, performed in [facility] hospital lab) Nasopharyngeal Nasopharyngeal Swab     Status: None   Collection Time: 06/22/20  4:33 PM   Specimen: Nasopharyngeal Swab  Result Value Ref Range Status   SARS Coronavirus 2 NEGATIVE NEGATIVE Final    Comment: (NOTE) SARS-CoV-2 target nucleic acids are NOT DETECTED.  The SARS-CoV-2 RNA is generally detectable in upper and lower respiratory specimens during the acute phase of infection. The lowest concentration of SARS-CoV-2 viral copies this assay can detect is 250 copies / mL. A negative result does not preclude  SARS-CoV-2 infection and should not be used as the sole basis for treatment or other patient management decisions.  A negative result may occur with improper specimen collection / handling, submission of specimen other than nasopharyngeal swab, presence of viral mutation(s) within the areas targeted by this assay, and inadequate number of viral copies (<250 copies / mL). A negative result must be combined with clinical observations, patient history, and epidemiological information.  Fact Sheet for Patients:   StrictlyIdeas.no  Fact Sheet for Healthcare Providers: BankingDealers.co.za  This test is not yet approved or  cleared by the [place] FDA and has been authorized for detection and/or diagnosis of SARS-CoV-2 by FDA under  an Emergency Use Authorization (EUA).  This EUA will remain in effect (meaning this test can be used) for the duration of the COVID-19 declaration under Section 564(b)(1) of the Act, 21 U.S.C. section 360bbb-3(b)(1), unless the authorization is terminated or revoked sooner.  Performed at [facility], [address] [place], [place] [address]     Coagulation Studies: Recent Labs    06/23/20 0444  LABPROT 13.2  INR 1.0    Urinalysis: Recent Labs    06/23/20 1025  COLORURINE RED*  LABSPEC 1.007  PHURINE TEST NOT REPORTED DUE TO COLOR INTERFERENCE OF URINE PIGMENT  GLUCOSEU TEST NOT REPORTED DUE TO COLOR INTERFERENCE OF URINE PIGMENT*  HGBUR TEST NOT REPORTED DUE TO COLOR INTERFERENCE OF URINE PIGMENT*  BILIRUBINUR TEST NOT REPORTED DUE TO COLOR INTERFERENCE OF URINE PIGMENT*  KETONESUR TEST NOT REPORTED DUE TO COLOR INTERFERENCE OF URINE PIGMENT*  PROTEINUR TEST NOT REPORTED DUE TO COLOR INTERFERENCE OF URINE PIGMENT*  NITRITE TEST NOT REPORTED DUE TO COLOR INTERFERENCE OF URINE PIGMENT*  LEUKOCYTESUR TEST NOT REPORTED DUE TO COLOR INTERFERENCE OF URINE PIGMENT*      Imaging: DG Chest 2 View  Result Date: 06/23/2020 CLINICAL DATA:  CHF, acute kidney failure EXAM: CHEST - 2 VIEW COMPARISON:  May 04, 2016 FINDINGS: Post median sternotomy for CABG.  Stable cardiac enlargement. Mild interstitial prominence bilaterally. No lobar consolidation. Patchy opacity with linear characteristics in the LEFT lung base. No acute skeletal process on limited assessment. IMPRESSION: 1. Cardiomegaly with mild interstitial prominence which may reflect mild edema. No lobar consolidation. 2. Areas of platelike in linear opacity at the LEFT lung base likely atelectasis. Electronically Signed   By: Zetta Bills M.D.   On: 06/23/2020 11:42     Medications:   . sodium chloride     . docusate sodium  200 mg Oral Daily  . heparin  5,000 Units Subcutaneous Q8H  . insulin aspart  0-9  Units Subcutaneous TID WC  . levothyroxine  150 mcg Oral QAC breakfast  . melatonin  5 mg Oral QHS  . metoprolol tartrate  50 mg Oral Q8H  . sodium chloride flush  3 mL Intravenous Q12H   sodium chloride, ALPRAZolam, HYDROcodone-acetaminophen, ondansetron, sodium chloride, sodium chloride flush  Assessment/ Plan:  [name] is a 86 y.o.  female [name] is a 86 y.o. white female with history of ANCA vasculitis, coronary artery disease status post CABG, diabetes mellitus type II, hypertension, hyperlipidemia, GERD, hypothyroidism, CVA, macular degeneration, diabetic neuropathy, history of breast cancer, allergies, carotid stenosis who was admitted to [facility] on 06/22/2020 for Hyperkalemia [E87.5] AKI (acute kidney injury) ([place]) [N17.9] Anemia, unspecified type [D64.9]  1. Acute renal failure on chronic kidney disease stage IIIB. Baseline creatinine of 1.4, GFR of 36 on 2/1 Creatinine  Stays elevated  5.66 today ANCA positive.  Plan for dialysis starting tomorrow.  Discussed the plan with patient and husband Plan to establish access tomorrow Vascular consulted  Continue stress dose steroids  2. Hematuria with h/o glomerulonephritis 3. Proteinuria: with history m-spike 4. History of ANCA pauci-immune glomerulonephritis Urine remains dark tea colored, frothy. Planning renal biopsy on Monday  5. Hyperkalemia  K+ 5.5 today Started on [place]  Received a dose of Lokelma   LOS: 2 Crosby Oyster 9/23/202112:27 PM  Patient was seen and examined with Crosby Oyster, DNP.  ANCA positive. Low D3 level Hepatitis screen negative, HIV negative, anti-GBM negative,  Pending ANCA titers, SPEP/UPEP, ANA.  Plan on starting hemodialysis tomorrow. Vascular consulted. Plan on renal biopsy later next week.  Continue high dose steroids. Will give immunomodulating agents after biopsy results.   Above plan was discussed and agreed upon on the signing of this note.   [name],  [facility]  9/23/20212:01 PM

## 2020-06-24 NOTE — Progress Notes (Signed)
PROGRESS NOTE    [name]  VOH:[id number] DOB: [birth year] DOA: 06/22/2020 PCP: Idelle Crouch, MD   Assessment & Plan:   Principal Problem:   Hyperkalemia Active Problems:   Diabetes mellitus ([place])   Hypertension   S/P CABG x 4   Cerebral embolism with cerebral infarction (HCC)   CAD (coronary artery disease)   Thyroid disease   HTN, goal below 140/80   CKD (chronic kidney disease) stage 5, GFR less than 15 ml/min (HCC)   Carotid stenosis   Prolonged QT interval   CKDV: w/ hematuria. 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 Cr is labile. Permcath will be placed by vascular surg for likely need for HD.  W/ hx of ANCA pauci-immune glomerulonephritis. Continue on stress dose steroids as per nephro. Kidney biopsy will be on 06/28/20 as pt needs to be off of plavix for 5 days. Nephro following and recs apprec   ACD: likely secondary to CKD & hematuria. No need for a transfusion at this time. Will continue to monitor   Hx of ANCA vasculitis: not on any treatments outpatient as per med rec  Hyperkalemia: likely secondary to CKD. Lokelma x 1. Will continue to monitor   CAD: s/p CABG. Continue on home dose of BB. Continue to hold hold home dose of aspirin, plavix  Hx of CVA: recent July 2021 w/ residual right arm weakness. Hold aspirin, plavix as per nephro   Carotid stenosis: recent IR procedure showed 30-40% left ICA stenosis, no acute intervention needed  DM2: continue to hold home dose of glimepiride. Continue on SSI w/ accuchecks   Chronic pain: norco prn   Anxiety: severity unknown. 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 Continue to hold home dose of lasix secondary CKD   Hypothyroidism: continue on home dose of synthroid      DVT prophylaxis: heparin SQ Code Status: full Family Communication:  Disposition Plan: depends on PT/OT recs  Status is: Inpatient  Remains inpatient appropriate  because:Ongoing diagnostic testing needed not appropriate for outpatient work up, IV treatments appropriate due to intensity of illness or inability to take PO and Inpatient level of care appropriate due to severity of illness   Dispo: The patient is from: Home              Anticipated d/c is to: Home vs SNF              Anticipated d/c date is: > 3 days              Patient currently is not medically stable to d/c.    Consultants:   nephro   Vascular surg   Procedures:   Antimicrobials:    Subjective: Pt c/o fatigue  Objective: Vitals:   06/23/20 2251 06/24/20 0448 06/24/20 0725 06/24/20 1244  BP: (!) 153/75 (!) 158/72 (!) 177/73 (!) 166/81  Pulse: 90  77 80  Resp: 19  18   Temp: 97.8 F (36.6 C)  97.7 F (36.5 C)   TempSrc: Oral  Oral   SpO2: 92%  94%     Intake/Output Summary (Last 24 hours) at 06/24/2020 1340 Last data filed at 06/24/2020 1012 Gross per 24 hour  Intake 120 ml  Output 500 ml  Net -380 ml   There were no vitals filed for this visit.  Examination:  General exam: Appears calm and comfortable  Respiratory system: clear breath sounds b/l. No rales Cardiovascular system: S1/S2+. No rubs, gallops Gastrointestinal  system: Abdomen is nondistended, soft and nontender. Hypoactive bowel sounds Central nervous system: Alert and oriented. Moves all 4 extremities  Psychiatry: Judgement and insight appear normal. Flat mood and affect     Data Reviewed: I have personally reviewed following labs and imaging studies  CBC: Recent Labs  Lab 06/22/20 1412 06/23/20 0444 06/24/20 0740  WBC 7.2 4.8 10.6*  HGB 8.2* 8.6* 8.7*  HCT 25.5* 25.8* 26.9*  MCV 90.1 88.4 89.1  PLT 181 169 628   Basic Metabolic Panel: Recent Labs  Lab 06/22/20 1412 06/22/20 2326 06/23/20 0444 06/24/20 0740  NA 135 138 137 132*  133*  K 5.6* 4.7 4.8 5.5*  5.5*  CL 100 102 103 99  99  CO2 25 24 23 23  23   GLUCOSE 161* 145* 150* 207*  209*  BUN 64* 58* 59* 70*   73*  CREATININE 5.15* 4.86* 4.59* 5.66*  5.72*  CALCIUM 7.6* 7.8* 8.0* 7.9*  7.9*  PHOS  --   --   --  6.1*   GFR: Estimated Creatinine Clearance: 8.3 mL/min (A) (by C-G formula based on SCr of 5.72 mg/dL (H)). Liver Function Tests: Recent Labs  Lab 06/22/20 1412 06/24/20 0740  AST 10*  --   ALT 6  --   ALKPHOS 78  --   BILITOT 0.7  --   PROT 7.9  --   ALBUMIN 3.1* 2.9*   No results for input(s): LIPASE, AMYLASE in the last 168 hours. No results for input(s): AMMONIA in the last 168 hours. Coagulation Profile: Recent Labs  Lab 06/23/20 0444  INR 1.0   Cardiac Enzymes: Recent Labs  Lab 06/23/20 1156  CKTOTAL 26*   BNP (last 3 results) No results for input(s): PROBNP in the last 8760 hours. HbA1C: Recent Labs    06/22/20 1412 06/23/20 0444  HGBA1C 5.3 5.4   CBG: Recent Labs  Lab 06/23/20 1213 06/23/20 1605 06/23/20 2108 06/24/20 0727 06/24/20 1157  GLUCAP 122* 178* 262* 205* 197*   Lipid Profile: No results for input(s): CHOL, HDL, LDLCALC, TRIG, CHOLHDL, LDLDIRECT in the last 72 hours. Thyroid Function Tests: Recent Labs    06/23/20 0444  TSH 3.275   Anemia Panel: Recent Labs    06/22/20 1412  FERRITIN 56  TIBC 302  IRON 33   Sepsis Labs: No results for input(s): PROCALCITON, LATICACIDVEN in the last 168 hours.  Recent Results (from the past 240 hour(s))  SARS Coronavirus 2 by RT PCR (hospital order, performed in [facility] hospital lab) Nasopharyngeal Nasopharyngeal Swab     Status: None   Collection Time: 06/22/20  4:33 PM   Specimen: Nasopharyngeal Swab  Result Value Ref Range Status   SARS Coronavirus 2 NEGATIVE NEGATIVE Final    Comment: (NOTE) SARS-CoV-2 target nucleic acids are NOT DETECTED.  The SARS-CoV-2 RNA is generally detectable in upper and lower respiratory specimens during the acute phase of infection. The lowest concentration of SARS-CoV-2 viral copies this assay can detect is 250 copies / mL. A negative result does  not preclude SARS-CoV-2 infection and should not be used as the sole basis for treatment or other patient management decisions.  A negative result may occur with improper specimen collection / handling, submission of specimen other than nasopharyngeal swab, presence of viral mutation(s) within the areas targeted by this assay, and inadequate number of viral copies (<250 copies / mL). A negative result must be combined with clinical observations, patient history, and epidemiological information.  Fact Sheet for Patients:  StrictlyIdeas.no  Fact Sheet for Healthcare Providers: BankingDealers.co.za  This test is not yet approved or  cleared by the [place] FDA and has been authorized for detection and/or diagnosis of SARS-CoV-2 by FDA under an Emergency Use Authorization (EUA).  This EUA will remain in effect (meaning this test can be used) for the duration of the COVID-19 declaration under Section 564(b)(1) of the Act, 21 U.S.C. section 360bbb-3(b)(1), unless the authorization is terminated or revoked sooner.  Performed at [facility], [address], [place], [place] [address]          Radiology Studies: DG Chest 2 View  Result Date: 06/23/2020 CLINICAL DATA:  CHF, acute kidney failure EXAM: CHEST - 2 VIEW COMPARISON:  May 04, 2016 FINDINGS: Post median sternotomy for CABG.  Stable cardiac enlargement. Mild interstitial prominence bilaterally. No lobar consolidation. Patchy opacity with linear characteristics in the LEFT lung base. No acute skeletal process on limited assessment. IMPRESSION: 1. Cardiomegaly with mild interstitial prominence which may reflect mild edema. No lobar consolidation. 2. Areas of platelike in linear opacity at the LEFT lung base likely atelectasis. Electronically Signed   By: Zetta Bills M.D.   On: 06/23/2020 11:42   ECHOCARDIOGRAM COMPLETE  Result Date: 06/24/2020    ECHOCARDIOGRAM  REPORT   Patient Name:   [name] Date of Exam: 06/23/2020 Medical Rec #:  [id number]         Height:       64.0 in Accession #:    [id number]        Weight:       207.0 lb Date of Birth:  Jun 11, 1934         BSA:          1.985 m Patient Age:    86 years          BP:           178/94 mmHg Patient Gender: F                 HR:           87 bpm. Exam Location:  [facility] Procedure: 2D Echo, Cardiac Doppler and Color Doppler Indications:     H41.93 Acute Diastolic CHF  History:         Patient has prior history of Echocardiogram examinations, most                  recent 07/17/2015. Prior CABG; Risk Factors:Hypertension,                  Dyslipidemia and Diabetes. Thyroid disease. Pneumonia. History                  of breast cancer. Dyspnea. Coronary artery disease. Chronic                  kidney disease.  Sonographer:     Wilford Sports Rodgers-Jones Referring Phys:  [id number] [place] Diagnosing Phys: Kate Sable MD IMPRESSIONS  1. Left ventricular ejection fraction, by estimation, is 60 to 65%. The left ventricle has normal function. The left ventricle has no regional wall motion abnormalities. Left ventricular diastolic parameters were normal.  2. Right ventricular systolic function is normal. The right ventricular size is normal. There is normal pulmonary artery systolic pressure.  3. The mitral valve is normal in structure. No evidence of mitral valve regurgitation. No evidence of mitral stenosis.  4. The aortic valve was not well visualized. Aortic valve regurgitation is not  visualized.  5. The inferior vena cava is normal in size with greater than 50% respiratory variability, suggesting right atrial pressure of 3 mmHg. FINDINGS  Left Ventricle: Left ventricular ejection fraction, by estimation, is 60 to 65%. The left ventricle has normal function. The left ventricle has no regional wall motion abnormalities. The left ventricular internal cavity size was normal in size. There is  no left ventricular  hypertrophy. Left ventricular diastolic parameters were normal. Right Ventricle: The right ventricular size is normal. No increase in right ventricular wall thickness. Right ventricular systolic function is normal. There is normal pulmonary artery systolic pressure. The tricuspid regurgitant velocity is 2.74 m/s, and  with an assumed right atrial pressure of 3 mmHg, the estimated right ventricular systolic pressure is 52.7 mmHg. Left Atrium: Left atrial size was normal in size. Right Atrium: Right atrial size was normal in size. Pericardium: There is no evidence of pericardial effusion. Mitral Valve: The mitral valve is normal in structure. No evidence of mitral valve regurgitation. No evidence of mitral valve stenosis. Tricuspid Valve: The tricuspid valve is normal in structure. Tricuspid valve regurgitation is not demonstrated. No evidence of tricuspid stenosis. Aortic Valve: The aortic valve was not well visualized. Aortic valve regurgitation is not visualized. Pulmonic Valve: The pulmonic valve was normal in structure. Pulmonic valve regurgitation is not visualized. No evidence of pulmonic stenosis. Aorta: The aortic root is normal in size and structure. Venous: The inferior vena cava is normal in size with greater than 50% respiratory variability, suggesting right atrial pressure of 3 mmHg. IAS/Shunts: No atrial level shunt detected by color flow Doppler.  LEFT VENTRICLE PLAX 2D LVIDd:         5.03 cm  Diastology LVIDs:         2.94 cm  LV e' medial:    8.38 cm/s LV PW:         0.99 cm  LV E/e' medial:  11.6 LV IVS:        0.91 cm  LV e' lateral:   9.36 cm/s LVOT diam:     2.00 cm  LV E/e' lateral: 10.4 LV SV:         61 LV SV Index:   31 LVOT Area:     3.14 cm  RIGHT VENTRICLE             IVC RV Basal diam:  3.51 cm     IVC diam: 1.51 cm RV S prime:     15.00 cm/s TAPSE (M-mode): 2.1 cm LEFT ATRIUM             Index       RIGHT ATRIUM           Index LA diam:        4.30 cm 2.17 cm/m  RA Area:     15.90 cm  LA Vol (A2C):   57.4 ml 28.91 ml/m RA Volume:   48.10 ml  24.23 ml/m LA Vol (A4C):   58.5 ml 29.47 ml/m LA Biplane Vol: 59.5 ml 29.97 ml/m  AORTIC VALVE LVOT Vmax:   79.30 cm/s LVOT Vmean:  56.600 cm/s LVOT VTI:    0.195 m  AORTA Ao Root diam: 2.60 cm Ao Asc diam:  3.20 cm MITRAL VALVE                TRICUSPID VALVE MV Area (PHT): 3.42 cm     TR Peak grad:   30.0 mmHg MV Decel Time: 222 msec  TR Vmax:        274.00 cm/s MV E velocity: 97.30 cm/s MV A velocity: 117.00 cm/s  SHUNTS MV E/A ratio:  0.83         Systemic VTI:  0.20 m                             Systemic Diam: 2.00 cm Kate Sable MD Electronically signed by Kate Sable MD Signature Date/Time: 06/24/2020/12:36:36 PM    Final         Scheduled Meds: . docusate sodium  200 mg Oral Daily  . insulin aspart  0-9 Units Subcutaneous TID WC  . levothyroxine  150 mcg Oral QAC breakfast  . melatonin  5 mg Oral QHS  . metoprolol tartrate  50 mg Oral Q8H  . sodium chloride flush  3 mL Intravenous Q12H   Continuous Infusions: . sodium chloride       LOS: 2 days    Time spent: 34 mins     Wyvonnia Dusky, MD [facility] Pager [phone]  If 7PM-7AM, please contact night-coverage [email or web address] 06/24/2020, 1:40 PM

## 2020-06-24 NOTE — Progress Notes (Signed)
[facility] Daily Progress Note   Subjective: The patient is an 86 year old female with multiple medical issues well-known to the practice who presented to the [facility] emergency department with worsening kidney function.  At this time, nephrology would like to initiate dialysis and the patient does not have adequate access.  Vascular surgery was consulted by Dr. Juleen China for placement of a PermCath.  Patient is without complaint this afternoon.  Objective: Vitals:   06/23/20 2019 06/23/20 2251 06/24/20 0448 06/24/20 0725  BP: (!) 160/84 (!) 153/75 (!) 158/72 (!) 177/73  Pulse:  90  77  Resp:  19  18  Temp:  97.8 F (36.6 C)  97.7 F (36.5 C)  TempSrc:  Oral  Oral  SpO2:  92%  94%    Intake/Output Summary (Last 24 hours) at 06/24/2020 1149 Last data filed at 06/24/2020 1012 Gross per 24 hour  Intake 120 ml  Output 500 ml  Net -380 ml   Physical Exam: A&Ox3, NAD CV: RRR Pulmonary: CTA Bilaterally Abdomen: Soft, Nontender, Nondistended Vascular: Extremities are warm distally toes, mild to moderate edema.   Laboratory: CBC    Component Value Date/Time   WBC 10.6 (H) 06/24/2020 0740   HGB 8.7 (L) 06/24/2020 0740   HGB 12.7 05/07/2014 0516   HCT 26.9 (L) 06/24/2020 0740   HCT 37.2 05/07/2014 0516   PLT 199 06/24/2020 0740   PLT 224 05/07/2014 0516   BMET    Component Value Date/Time   NA 133 (L) 06/24/2020 0740   NA 132 (L) 06/24/2020 0740   NA 140 11/22/2015 0830   NA 142 05/07/2014 0516   K 5.5 (H) 06/24/2020 0740   K 5.5 (H) 06/24/2020 0740   K 3.2 (L) 05/07/2014 0516   CL 99 06/24/2020 0740   CL 99 06/24/2020 0740   CL 104 05/07/2014 0516   CO2 23 06/24/2020 0740   CO2 23 06/24/2020 0740   CO2 29 05/07/2014 0516   GLUCOSE 209 (H) 06/24/2020 0740   GLUCOSE 207 (H) 06/24/2020 0740   GLUCOSE 64 (L) 05/07/2014 0516   BUN 73 (H) 06/24/2020 0740   BUN 70 (H) 06/24/2020 0740   BUN 35 (H) 11/22/2015 0830   BUN 26  (H) 05/07/2014 0516   CREATININE 5.72 (H) 06/24/2020 0740   CREATININE 5.66 (H) 06/24/2020 0740   CREATININE 1.13 05/07/2014 0516   CALCIUM 7.9 (L) 06/24/2020 0740   CALCIUM 7.9 (L) 06/24/2020 0740   CALCIUM 7.9 (L) 05/07/2014 0516   GFRNONAA 6 (L) 06/24/2020 0740   GFRNONAA 6 (L) 06/24/2020 0740   GFRNONAA 47 (L) 05/07/2014 0516   GFRAA 7 (L) 06/24/2020 0740   GFRAA 7 (L) 06/24/2020 0740   GFRAA 54 (L) 05/07/2014 0516   Assessment/Planning: The patient is an 86 year old female with multiple medical issues well-known to the practice who presented to the [facility] emergency department with worsening kidney function.    1) ESRD: Patient presents with worsening kidney function.  At this time, nephrology would like to initiate dialysis however the patient does not have adequate access.  Recommend placement of a PermCath to allow for immediate dialysis in the inpatient outpatient setting.  Procedure, risks and benefits were explained to the patient.  All questions answered.  Patient was to proceed.  We will plan on this tomorrow with Dr. Lucky Cowboy.  2) Diabetes: On appropriate medications. Encouraged good control as its slows the progression of atherosclerotic disease.  3) Hyperlipidemia: On  aspirin and Plavix. Allergic to statins. Encouraged good control as its slows the progression of atherosclerotic disease.  Discussed with Dr. Ellis Parents Kiyonna Tortorelli PA-C 06/24/2020 11:49 AM

## 2020-06-25 ENCOUNTER — Inpatient Hospital Stay
Admission: EM | Disposition: A | Discharge status: Home Health | Payer: Self-pay | Source: Home / Self Care | Attending: Internal Medicine

## 2020-06-25 ENCOUNTER — Encounter: Payer: Self-pay | Admitting: Obstetrics and Gynecology

## 2020-06-25 DIAGNOSIS — N185 Chronic kidney disease, stage 5: Secondary | ICD-10-CM

## 2020-06-25 HISTORY — PX: DIALYSIS/PERMA CATHETER INSERTION: CATH118288

## 2020-06-25 LAB — BASIC METABOLIC PANEL
Anion gap: 12 (ref 5–15)
BUN: 85 mg/dL — ABNORMAL HIGH (ref 8–23)
CO2: 23 mmol/L (ref 22–32)
Calcium: 7.4 mg/dL — ABNORMAL LOW (ref 8.9–10.3)
Chloride: 102 mmol/L (ref 98–111)
Creatinine, Ser: 6.15 mg/dL — ABNORMAL HIGH (ref 0.44–1.00)
GFR calc Af Amer: 7 mL/min — ABNORMAL LOW (ref >60)
GFR calc non Af Amer: 6 mL/min — ABNORMAL LOW (ref >60)
Glucose, Bld: 136 mg/dL — ABNORMAL HIGH (ref 70–99)
Potassium: 4.8 mmol/L (ref 3.5–5.1)
Sodium: 137 mmol/L (ref 135–145)

## 2020-06-25 LAB — CBC
HCT: 27.5 % — ABNORMAL LOW (ref 36.0–46.0)
Hemoglobin: 8.5 g/dL — ABNORMAL LOW (ref 12.0–15.0)
MCH: 29.1 pg (ref 26.0–34.0)
MCHC: 30.9 g/dL (ref 30.0–36.0)
MCV: 94.2 fL (ref 80.0–100.0)
Platelets: 110 10*3/uL — ABNORMAL LOW (ref 150–400)
RBC: 2.92 MIL/uL — ABNORMAL LOW (ref 3.87–5.11)
RDW: 15.8 % — ABNORMAL HIGH (ref 11.5–15.5)
WBC: 9.7 10*3/uL (ref 4.0–10.5)
nRBC: 0 % (ref 0.0–0.2)

## 2020-06-25 LAB — IRON AND TIBC
Iron: 36 ug/dL (ref 28–170)
Saturation Ratios: 13 % (ref 10.4–31.8)
TIBC: 283 ug/dL (ref 250–450)
UIBC: 247 ug/dL

## 2020-06-25 LAB — RENAL FUNCTION PANEL
Albumin: 2.9 g/dL — ABNORMAL LOW (ref 3.5–5.0)
Anion gap: 12 (ref 5–15)
BUN: 84 mg/dL — ABNORMAL HIGH (ref 8–23)
CO2: 24 mmol/L (ref 22–32)
Calcium: 7.4 mg/dL — ABNORMAL LOW (ref 8.9–10.3)
Chloride: 101 mmol/L (ref 98–111)
Creatinine, Ser: 6.09 mg/dL — ABNORMAL HIGH (ref 0.44–1.00)
GFR calc Af Amer: 7 mL/min — ABNORMAL LOW (ref >60)
GFR calc non Af Amer: 6 mL/min — ABNORMAL LOW (ref >60)
Glucose, Bld: 136 mg/dL — ABNORMAL HIGH (ref 70–99)
Phosphorus: 6.1 mg/dL — ABNORMAL HIGH (ref 2.5–4.6)
Potassium: 4.8 mmol/L (ref 3.5–5.1)
Sodium: 137 mmol/L (ref 135–145)

## 2020-06-25 LAB — QUANTIFERON-TB GOLD PLUS (RQFGPL)
QuantiFERON Mitogen Value: 0.59 IU/mL
QuantiFERON Nil Value: 0.01 IU/mL
QuantiFERON TB1 Ag Value: 0.03 IU/mL
QuantiFERON TB2 Ag Value: 0.03 IU/mL

## 2020-06-25 LAB — QUANTIFERON-TB GOLD PLUS: QuantiFERON-TB Gold Plus: NEGATIVE

## 2020-06-25 LAB — GLUCOSE, CAPILLARY
Glucose-Capillary: 142 mg/dL — ABNORMAL HIGH (ref 70–99)
Glucose-Capillary: 125 mg/dL — ABNORMAL HIGH (ref 70–99)
Glucose-Capillary: 106 mg/dL — ABNORMAL HIGH (ref 70–99)
Glucose-Capillary: 139 mg/dL — ABNORMAL HIGH (ref 70–99)
Glucose-Capillary: 125 mg/dL — ABNORMAL HIGH (ref 70–99)

## 2020-06-25 LAB — MPO/PR-3 (ANCA) ANTIBODIES
ANCA Proteinase 3: 48.2 U/mL — ABNORMAL HIGH (ref 0.0–3.5)
Myeloperoxidase Abs: <9 U/mL (ref 0.0–9.0)

## 2020-06-25 LAB — ANCA TITERS
Atypical P-ANCA titer: <1:20 {titer}
C-ANCA: <1:20 {titer}
P-ANCA: 1:640 {titer} — ABNORMAL HIGH

## 2020-06-25 LAB — FERRITIN: Ferritin: 67 ng/mL (ref 11–307)

## 2020-06-25 SURGERY — DIALYSIS/PERMA CATHETER INSERTION
Anesthesia: Moderate Sedation | Laterality: Left

## 2020-06-25 MED ORDER — HYDROCODONE-ACETAMINOPHEN 7.5-325 MG PO TABS
1.0000 | ORAL_TABLET | Freq: Four times a day (QID) | ORAL | Status: DC | PRN
  Administered 2020-06-25 – 2020-07-02 (×13): 1 via ORAL
  Filled 2020-06-25 (×13): qty 1

## 2020-06-25 MED ORDER — CHLORHEXIDINE GLUCONATE CLOTH 2 % EX PADS
6.0000 | MEDICATED_PAD | Freq: Every day | CUTANEOUS | Status: DC
  Administered 2020-06-25 – 2020-07-02 (×8): 6 via TOPICAL

## 2020-06-25 MED ORDER — METHYLPREDNISOLONE SODIUM SUCC 125 MG IJ SOLR: 125.0000 mg | Freq: Once | INTRAMUSCULAR | Status: DC | PRN

## 2020-06-25 MED ORDER — ONDANSETRON HCL 4 MG/2ML IJ SOLN
4.0000 mg | Freq: Four times a day (QID) | INTRAMUSCULAR | Status: DC | PRN
  Administered 2020-06-25 – 2020-06-30 (×10): 4 mg via INTRAVENOUS
  Filled 2020-06-25 (×11): qty 2

## 2020-06-25 MED ORDER — CEFAZOLIN SODIUM-DEXTROSE 1-4 GM/50ML-% IV SOLN
1.0000 g | Freq: Once | INTRAVENOUS | Status: AC
  Administered 2020-06-25: 1 g via INTRAVENOUS
  Filled 2020-06-25: qty 50

## 2020-06-25 MED ORDER — FENTANYL CITRATE (PF) 100 MCG/2ML IJ SOLN
INTRAMUSCULAR | Status: AC
Start: 2020-06-25 — End: 2020-06-26
  Filled 2020-06-25: qty 2

## 2020-06-25 MED ORDER — HYDROMORPHONE HCL 1 MG/ML IJ SOLN
1.0000 mg | Freq: Once | INTRAMUSCULAR | Status: AC | PRN
  Administered 2020-06-25: 1 mg via INTRAVENOUS
  Filled 2020-06-25: qty 1

## 2020-06-25 MED ORDER — MIDAZOLAM HCL 2 MG/ML PO SYRP: 8.0000 mg | ORAL_SOLUTION | Freq: Once | ORAL | Status: DC | PRN

## 2020-06-25 MED ORDER — DIPHENHYDRAMINE HCL 50 MG/ML IJ SOLN: 50.0000 mg | Freq: Once | INTRAMUSCULAR | Status: DC | PRN

## 2020-06-25 MED ORDER — MIDAZOLAM HCL 2 MG/2ML IJ SOLN
INTRAMUSCULAR | Status: AC
  Filled 2020-06-25: qty 2

## 2020-06-25 MED ORDER — SCOPOLAMINE 1 MG/3DAYS TD PT72
1.0000 | MEDICATED_PATCH | TRANSDERMAL | Status: DC
  Administered 2020-06-25 – 2020-07-01 (×3): 1.5 mg via TRANSDERMAL
  Filled 2020-06-25 (×3): qty 1

## 2020-06-25 MED ORDER — FENTANYL CITRATE (PF) 100 MCG/2ML IJ SOLN
INTRAMUSCULAR | Status: DC | PRN
Start: 2020-06-25 — End: 2020-06-25
  Administered 2020-06-25: 50 ug via INTRAVENOUS

## 2020-06-25 MED ORDER — FAMOTIDINE 20 MG PO TABS: 40.0000 mg | ORAL_TABLET | Freq: Once | ORAL | Status: DC | PRN

## 2020-06-25 MED ORDER — MIDAZOLAM HCL 2 MG/2ML IJ SOLN
INTRAMUSCULAR | Status: DC | PRN
  Administered 2020-06-25: 1 mg via INTRAVENOUS

## 2020-06-25 MED ORDER — SODIUM CHLORIDE 0.9 % IV SOLN: INTRAVENOUS | Status: DC

## 2020-06-25 MED ORDER — HEPARIN SODIUM (PORCINE) 10000 UNIT/ML IJ SOLN
INTRAMUSCULAR | Status: AC
  Filled 2020-06-25: qty 1

## 2020-06-25 MED ORDER — MORPHINE SULFATE (PF) 2 MG/ML IV SOLN: 1.0000 mg | INTRAVENOUS | Status: DC | PRN

## 2020-06-25 SURGICAL SUPPLY — 9 items
BIOPATCH RED 1 DISK 7.0 (GAUZE/BANDAGES/DRESSINGS) ×2 IMPLANT
CATH CANNON HEMO 15FR 19 (HEMODIALYSIS SUPPLIES) ×2 IMPLANT
DERMABOND ADVANCED (GAUZE/BANDAGES/DRESSINGS) ×1
DERMABOND ADVANCED .7 DNX12 (GAUZE/BANDAGES/DRESSINGS) ×1 IMPLANT
PACK ANGIOGRAPHY (CUSTOM PROCEDURE TRAY) ×2 IMPLANT
SUT MNCRL 4-0 (SUTURE) ×1
SUT MNCRL 4-0 27XMFL (SUTURE) ×1
SUT PROLENE 0 CT 1 30 (SUTURE) ×2 IMPLANT
SUTURE MNCRL 4-0 27XMF (SUTURE) ×1 IMPLANT

## 2020-06-25 NOTE — Op Note (Signed)
OPERATIVE NOTE    PRE-OPERATIVE DIAGNOSIS: 1. ESRD   POST-OPERATIVE DIAGNOSIS: same as above  PROCEDURE: 1. Ultrasound guidance for vascular access to the right internal jugular vein 2. Fluoroscopic guidance for placement of catheter 3. Placement of a 19 cm tip to cuff tunneled hemodialysis catheter via the right internal jugular vein  SURGEON: [name], MD  ANESTHESIA:  Local with Moderate conscious sedation for approximately 31 minutes using 1 mg of Versed and 50 mcg of Fentanyl  ESTIMATED BLOOD LOSS: 5 cc  FLUORO TIME: less than one minute  CONTRAST: none  FINDING(S): 1.  Patent right internal jugular vein  SPECIMEN(S):  None  INDICATIONS:   [name] is a 86 y.o.female who presents with renal failure and multiple other issues.  The patient needs long term dialysis access for their ESRD, and a Permcath is necessary.  Risks and benefits are discussed and informed consent is obtained.    DESCRIPTION: After obtaining full informed written consent, the patient was brought back to the vascular suited. The patient's right neck and chest were sterilely prepped and draped in a sterile surgical field was created. Moderate conscious sedation was administered during a face to face encounter with the patient throughout the procedure with my supervision of the RN administering medicines and monitoring the patient's vital signs, pulse oximetry, telemetry and mental status throughout from the start of the procedure until the patient was taken to the recovery room.  The right internal jugular vein was visualized with ultrasound and found to be patent. It was then accessed under direct ultrasound guidance and a permanent image was recorded. A wire was placed. After skin nick and dilatation, the peel-away sheath was placed over the wire. I then turned my attention to an area under the clavicle. Approximately 1-2 fingerbreadths below the clavicle a small counterincision was created and  tunneled from the subclavicular incision to the access site. Using fluoroscopic guidance, a 19 centimeter tip to cuff tunneled hemodialysis catheter was selected, and tunneled from the subclavicular incision to the access site. It was then placed through the peel-away sheath and the peel-away sheath was removed. Using fluoroscopic guidance the catheter tips were parked in the right atrium. The appropriate distal connectors were placed. It withdrew blood well and flushed easily with heparinized saline and a concentrated heparin solution was then placed. It was secured to the chest wall with 2 Prolene sutures. The access incision was closed single 4-0 Monocryl. A 4-0 Monocryl pursestring suture was placed around the exit site. Sterile dressings were placed. The patient tolerated the procedure well and was taken to the recovery room in stable condition.  COMPLICATIONS: None  CONDITION: Stable  [name], MD 06/25/2020 3:16 PM   This note was created with Dragon Medical transcription system. Any errors in dictation are purely unintentional.

## 2020-06-25 NOTE — Care Management Important Message (Signed)
Important Message  Patient Details  Name: [name] MRN: [id number] Date of Birth: 1934/05/17   Medicare Important Message Given:  Yes     [name] 06/25/2020, 11:22 AM

## 2020-06-25 NOTE — Progress Notes (Signed)
[facility]  ROUNDING NOTE   Subjective:   Husband at bedside.  Creatinine 6.15 (5.72)  Scheduled for dialysis catheter and first dialysis treatment today   Objective:  Vital signs in last 24 hours:  Temp:  [97.6 F (36.4 C)-98.6 F (37 C)] 98.6 F (37 C) (09/24 0740) Pulse Rate:  [75-86] 79 (09/24 0740) Resp:  [16-18] 18 (09/24 0740) BP: (155-164)/(67-77) 159/75 (09/24 0740) SpO2:  [93 %] 93 % (09/24 0740)  Weight change:  There were no vitals filed for this visit.  Intake/Output: I/O last 3 completed shifts: In: 360 [P.O.:360] Out: 800 [Urine:800]   Intake/Output this shift:  Total I/O In: -  Out: 500 [Urine:500]  Physical Exam: General: Awake, resting in bed  Head: Normocephalic, atraumatic. Moist oral mucosal membranes  Eyes: Anicteric, PERRL  Neck: Supple, trachea midline  Lungs:  Clear to auscultation bilaterally  Heart: S1S2+Regular rate and rhythm  Abdomen:  Soft, nontender,   Extremities:  No  peripheral edema.  Neurologic: Nonfocal, moving all four extremities  Skin: No acute lesions or rashes  Access: To be established    Basic Metabolic Panel: Recent Labs  Lab 06/22/20 1412 06/22/20 1412 06/22/20 2326 06/22/20 2326 06/23/20 0444 06/24/20 0740 06/25/20 0435  NA 135  --  138  --  137 132*  133* 137  137  K 5.6*  --  4.7  --  4.8 5.5*  5.5* 4.8  4.8  CL 100  --  102  --  103 99  99 101  102  CO2 25  --  24  --  23 23  23 24  23   GLUCOSE 161*  --  145*  --  150* 207*  209* 136*  136*  BUN 64*  --  58*  --  59* 70*  73* 84*  85*  CREATININE 5.15*  --  4.86*  --  4.59* 5.66*  5.72* 6.09*  6.15*  CALCIUM 7.6*   < > 7.8*   < > 8.0* 7.9*  7.9* 7.4*  7.4*  PHOS  --   --   --   --   --  6.1* 6.1*   < > = values in this interval not displayed.    Liver Function Tests: Recent Labs  Lab 06/22/20 1412 06/24/20 0740 06/25/20 0435  AST 10*  --   --   ALT 6  --   --   ALKPHOS 78  --   --   BILITOT 0.7  --   --    PROT 7.9  --   --   ALBUMIN 3.1* 2.9* 2.9*   No results for input(s): LIPASE, AMYLASE in the last 168 hours. No results for input(s): AMMONIA in the last 168 hours.  CBC: Recent Labs  Lab 06/22/20 1412 06/23/20 0444 06/24/20 0740 06/25/20 0435  WBC 7.2 4.8 10.6* 9.7  HGB 8.2* 8.6* 8.7* 8.5*  HCT 25.5* 25.8* 26.9* 27.5*  MCV 90.1 88.4 89.1 94.2  PLT 181 169 199 110*    Cardiac Enzymes: Recent Labs  Lab 06/23/20 1156  CKTOTAL 26*    BNP: Invalid input(s): POCBNP  CBG: Recent Labs  Lab 06/24/20 1157 06/24/20 1602 06/24/20 2140 06/25/20 0741 06/25/20 1128  GLUCAP 197* 148* 140* 142* 125*    Microbiology: Results for orders placed or performed during the hospital encounter of 06/22/20  SARS Coronavirus 2 by RT PCR (hospital order, performed in [facility] hospital lab) Nasopharyngeal Nasopharyngeal Swab     Status: None  Collection Time: 06/22/20  4:33 PM   Specimen: Nasopharyngeal Swab  Result Value Ref Range Status   SARS Coronavirus 2 NEGATIVE NEGATIVE Final    Comment: (NOTE) SARS-CoV-2 target nucleic acids are NOT DETECTED.  The SARS-CoV-2 RNA is generally detectable in upper and lower respiratory specimens during the acute phase of infection. The lowest concentration of SARS-CoV-2 viral copies this assay can detect is 250 copies / mL. A negative result does not preclude SARS-CoV-2 infection and should not be used as the sole basis for treatment or other patient management decisions.  A negative result may occur with improper specimen collection / handling, submission of specimen other than nasopharyngeal swab, presence of viral mutation(s) within the areas targeted by this assay, and inadequate number of viral copies (<250 copies / mL). A negative result must be combined with clinical observations, patient history, and epidemiological information.  Fact Sheet for Patients:   StrictlyIdeas.no  Fact Sheet for Healthcare  Providers: BankingDealers.co.za  This test is not yet approved or  cleared by the [place] FDA and has been authorized for detection and/or diagnosis of SARS-CoV-2 by FDA under an Emergency Use Authorization (EUA).  This EUA will remain in effect (meaning this test can be used) for the duration of the COVID-19 declaration under Section 564(b)(1) of the Act, 21 U.S.C. section 360bbb-3(b)(1), unless the authorization is terminated or revoked sooner.  Performed at [facility], [address] [place], [place] [address]     Coagulation Studies: Recent Labs    06/23/20 0444  LABPROT 13.2  INR 1.0    Urinalysis: Recent Labs    06/23/20 1025  COLORURINE RED*  LABSPEC 1.007  PHURINE TEST NOT REPORTED DUE TO COLOR INTERFERENCE OF URINE PIGMENT  GLUCOSEU TEST NOT REPORTED DUE TO COLOR INTERFERENCE OF URINE PIGMENT*  HGBUR TEST NOT REPORTED DUE TO COLOR INTERFERENCE OF URINE PIGMENT*  BILIRUBINUR TEST NOT REPORTED DUE TO COLOR INTERFERENCE OF URINE PIGMENT*  KETONESUR TEST NOT REPORTED DUE TO COLOR INTERFERENCE OF URINE PIGMENT*  PROTEINUR TEST NOT REPORTED DUE TO COLOR INTERFERENCE OF URINE PIGMENT*  NITRITE TEST NOT REPORTED DUE TO COLOR INTERFERENCE OF URINE PIGMENT*  LEUKOCYTESUR TEST NOT REPORTED DUE TO COLOR INTERFERENCE OF URINE PIGMENT*      Imaging: ECHOCARDIOGRAM COMPLETE  Result Date: 06/24/2020    ECHOCARDIOGRAM REPORT   Patient Name:   [name] Date of Exam: 06/23/2020 Medical Rec #:  [id number]         Height:       64.0 in Accession #:    [id number]        Weight:       207.0 lb Date of Birth:  1934/06/28         BSA:          1.985 m Patient Age:    86 years          BP:           178/94 mmHg Patient Gender: F                 HR:           87 bpm. Exam Location:  [facility] Procedure: 2D Echo, Cardiac Doppler and Color Doppler Indications:     R42.70 Acute Diastolic CHF  History:         Patient has prior history of Echocardiogram  examinations, most                  recent  07/17/2015. Prior CABG; Risk Factors:Hypertension,                  Dyslipidemia and Diabetes. Thyroid disease. Pneumonia. History                  of breast cancer. Dyspnea. Coronary artery disease. Chronic                  kidney disease.  Sonographer:     Wilford Sports Rodgers-Jones Referring Phys:  [id number] [place] Diagnosing Phys: Kate Sable MD IMPRESSIONS  1. Left ventricular ejection fraction, by estimation, is 60 to 65%. The left ventricle has normal function. The left ventricle has no regional wall motion abnormalities. Left ventricular diastolic parameters were normal.  2. Right ventricular systolic function is normal. The right ventricular size is normal. There is normal pulmonary artery systolic pressure.  3. The mitral valve is normal in structure. No evidence of mitral valve regurgitation. No evidence of mitral stenosis.  4. The aortic valve was not well visualized. Aortic valve regurgitation is not visualized.  5. The inferior vena cava is normal in size with greater than 50% respiratory variability, suggesting right atrial pressure of 3 mmHg. FINDINGS  Left Ventricle: Left ventricular ejection fraction, by estimation, is 60 to 65%. The left ventricle has normal function. The left ventricle has no regional wall motion abnormalities. The left ventricular internal cavity size was normal in size. There is  no left ventricular hypertrophy. Left ventricular diastolic parameters were normal. Right Ventricle: The right ventricular size is normal. No increase in right ventricular wall thickness. Right ventricular systolic function is normal. There is normal pulmonary artery systolic pressure. The tricuspid regurgitant velocity is 2.74 m/s, and  with an assumed right atrial pressure of 3 mmHg, the estimated right ventricular systolic pressure is 03.4 mmHg. Left Atrium: Left atrial size was normal in size. Right Atrium: Right atrial size was normal in size.  Pericardium: There is no evidence of pericardial effusion. Mitral Valve: The mitral valve is normal in structure. No evidence of mitral valve regurgitation. No evidence of mitral valve stenosis. Tricuspid Valve: The tricuspid valve is normal in structure. Tricuspid valve regurgitation is not demonstrated. No evidence of tricuspid stenosis. Aortic Valve: The aortic valve was not well visualized. Aortic valve regurgitation is not visualized. Pulmonic Valve: The pulmonic valve was normal in structure. Pulmonic valve regurgitation is not visualized. No evidence of pulmonic stenosis. Aorta: The aortic root is normal in size and structure. Venous: The inferior vena cava is normal in size with greater than 50% respiratory variability, suggesting right atrial pressure of 3 mmHg. IAS/Shunts: No atrial level shunt detected by color flow Doppler.  LEFT VENTRICLE PLAX 2D LVIDd:         5.03 cm  Diastology LVIDs:         2.94 cm  LV e' medial:    8.38 cm/s LV PW:         0.99 cm  LV E/e' medial:  11.6 LV IVS:        0.91 cm  LV e' lateral:   9.36 cm/s LVOT diam:     2.00 cm  LV E/e' lateral: 10.4 LV SV:         61 LV SV Index:   31 LVOT Area:     3.14 cm  RIGHT VENTRICLE             IVC RV Basal diam:  3.51 cm     IVC diam: 1.51 cm  RV S prime:     15.00 cm/s TAPSE (M-mode): 2.1 cm LEFT ATRIUM             Index       RIGHT ATRIUM           Index LA diam:        4.30 cm 2.17 cm/m  RA Area:     15.90 cm LA Vol (A2C):   57.4 ml 28.91 ml/m RA Volume:   48.10 ml  24.23 ml/m LA Vol (A4C):   58.5 ml 29.47 ml/m LA Biplane Vol: 59.5 ml 29.97 ml/m  AORTIC VALVE LVOT Vmax:   79.30 cm/s LVOT Vmean:  56.600 cm/s LVOT VTI:    0.195 m  AORTA Ao Root diam: 2.60 cm Ao Asc diam:  3.20 cm MITRAL VALVE                TRICUSPID VALVE MV Area (PHT): 3.42 cm     TR Peak grad:   30.0 mmHg MV Decel Time: 222 msec     TR Vmax:        274.00 cm/s MV E velocity: 97.30 cm/s MV A velocity: 117.00 cm/s  SHUNTS MV E/A ratio:  0.83         Systemic VTI:   0.20 m                             Systemic Diam: 2.00 cm Kate Sable MD Electronically signed by Kate Sable MD Signature Date/Time: 06/24/2020/12:36:36 PM    Final      Medications:   . sodium chloride    . sodium chloride    .  ceFAZolin (ANCEF) IV     . Chlorhexidine Gluconate Cloth  6 each Topical Q0600  . docusate sodium  200 mg Oral Daily  . insulin aspart  0-9 Units Subcutaneous TID WC  . levothyroxine  150 mcg Oral QAC breakfast  . melatonin  5 mg Oral QHS  . metoprolol tartrate  50 mg Oral Q8H  . scopolamine  1 patch Transdermal Q72H  . sodium chloride flush  3 mL Intravenous Q12H   sodium chloride, ALPRAZolam, diphenhydrAMINE, famotidine, HYDROcodone-acetaminophen, HYDROmorphone (DILAUDID) injection, methylPREDNISolone (SOLU-MEDROL) injection, midazolam, morphine injection, ondansetron (ZOFRAN) IV, ondansetron, polyvinyl alcohol, sodium chloride, sodium chloride flush  Assessment/ Plan:  [name] is a 86 y.o.  female [name] is a 86 y.o. white female with history of ANCA vasculitis, coronary artery disease status post CABG, diabetes mellitus type II, hypertension, hyperlipidemia, GERD, hypothyroidism, CVA, macular degeneration, diabetic neuropathy, history of breast cancer, allergies, carotid stenosis who was admitted to [facility] on 06/22/2020 for Hyperkalemia [E87.5] AKI (acute kidney injury) ([place]) [N17.9] Anemia, unspecified type [D64.9]  1. Acute renal failure on chronic kidney disease stage IIIB. Baseline creatinine of 1.4, GFR of 36 on 2/1 ANCA positive. Low C3 level 2. Hematuria with h/o glomerulonephritis 3. Proteinuria: with history m-spike 4. History of ANCA pauci-immune glomerulonephritis 5. Hyperkalemia 6. Positive M-spike on SPEP/UPEP  - Plan for tunneled catheter and dialysis today.  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  - veltassa - Renal biopsy scheduled  for Monday   LOS: 3 Tremeka Helbling 9/24/20211:27 PM

## 2020-06-25 NOTE — Progress Notes (Signed)
Dr. Lucky Cowboy spoke with pt. At bedside post permcath placement. Pt. Spouse in to see pt. Dialysis called : pt. To have 1st dialysis treatment today after recovery.

## 2020-06-25 NOTE — Plan of Care (Signed)
  Problem: Education: Goal: Knowledge of General Education information will improve Description: Including pain rating scale, medication(s)/side effects and non-pharmacologic comfort measures Outcome: Progressing   Problem: Health Behavior/Discharge Planning: Goal: Ability to manage health-related needs will improve Outcome: Progressing   Problem: Clinical Measurements: Goal: Will remain free from infection Outcome: Progressing Goal: Respiratory complications will improve Outcome: Progressing   Problem: Activity: Goal: Risk for activity intolerance will decrease Outcome: Progressing   Problem: Nutrition: Goal: Adequate nutrition will be maintained Outcome: Progressing   Problem: Coping: Goal: Level of anxiety will decrease Outcome: Progressing   Problem: Elimination: Goal: Will not experience complications related to bowel motility Outcome: Progressing Goal: Will not experience complications related to urinary retention Outcome: Progressing   Problem: Pain Managment: Goal: General experience of comfort will improve Outcome: Progressing   Problem: Safety: Goal: Ability to remain free from injury will improve Outcome: Progressing   Problem: Skin Integrity: Goal: Risk for impaired skin integrity will decrease Outcome: Progressing

## 2020-06-25 NOTE — Progress Notes (Signed)
PROGRESS NOTE    [name]  JSH:[id number] DOB: [birth year] DOA: 06/22/2020 PCP: Idelle Crouch, MD   Assessment & Plan:   Principal Problem:   Hyperkalemia Active Problems:   Diabetes mellitus ([place])   Hypertension   S/P CABG x 4   Cerebral embolism with cerebral infarction (HCC)   CAD (coronary artery disease)   Thyroid disease   HTN, goal below 140/80   CKD (chronic kidney disease) stage 5, GFR less than 15 ml/min (HCC)   Carotid stenosis   Prolonged QT interval   AKI on CKDIIIb: currently stage V, w/ hematuria. 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 Cr is labile. Permcath will be placed & HD today. W/ hx of ANCA pauci-immune glomerulonephritis. Continue on stress dose steroids as per nephro. Kidney biopsy will be on 06/28/20 as pt needs to be off of plavix for 5 days. Nephro following and recs apprec   ACD: likely secondary to CKD. Will transfuse if Hb <7. Will continue to monitor   Hx of ANCA vasculitis: not on any treatments outpatient as per med rec  Hyperkalemia: likely secondary to CKD. WNL today.   Thrombocytopenia: etiology unclear. Will continue to monitor   CAD: s/p CABG. Continue on home dose of metoprolol. Continue to hold aspirn, plavix   Hx of CVA: recent July 2021 w/ residual right arm weakness. Hold aspirin, plavix as per nephro   Carotid stenosis: recent IR procedure showed 30-40% left ICA stenosis, no acute intervention needed  DM2: continue to hold home dose of glimepiride. Continue on SSI w/ accuchecks   Chronic pain: increased norco dose. IV morphine prn   Anxiety: severity unknown. 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 Continue to hold home dose of lasix secondary CKD   Hypothyroidism:continue on home dose of levothyroxine       DVT prophylaxis: heparin SQ Code Status: full Family Communication: discussed pt's care w/ pt's husband who is at bedside and answered  his questions  Disposition Plan: depends on PT/OT recs  Status is: Inpatient  Remains inpatient appropriate because:Ongoing diagnostic testing needed not appropriate for outpatient work up, IV treatments appropriate due to intensity of illness or inability to take PO and Inpatient level of care appropriate due to severity of illness   Dispo: The patient is from: Home              Anticipated d/c is to: Home vs SNF              Anticipated d/c date is: > 3 days              Patient currently is not medically stable to d/c.    Consultants:   nephro   Vascular surg   Procedures:   Antimicrobials:    Subjective: Pt c/o malaise   Objective: Vitals:   06/24/20 1244 06/24/20 1601 06/24/20 2333 06/25/20 0740  BP: (!) 166/81 (!) 164/77 (!) 155/67 (!) 159/75  Pulse: 80 86 75 79  Resp:  18 16 18   Temp:  98.4 F (36.9 C) 97.6 F (36.4 C) 98.6 F (37 C)  TempSrc:  Oral Oral   SpO2:  93% 93% 93%    Intake/Output Summary (Last 24 hours) at 06/25/2020 0759 Last data filed at 06/24/2020 2019 Gross per 24 hour  Intake 360 ml  Output 400 ml  Net -40 ml   There were no vitals filed for this visit.  Examination:  General exam: Appears calm, comfortable  Respiratory system: clear breath sounds b/l. Normal RR  Cardiovascular system: S1/S2+. No rubs, gallops Gastrointestinal system: Abdomen is nondistended, soft and nontender. Hypoactive bowel sounds Central nervous system: Alert and oriented. Moves all 4 extremities   Psychiatry: Judgement and insight appear normal. Flat mood and affect     Data Reviewed: I have personally reviewed following labs and imaging studies  CBC: Recent Labs  Lab 06/22/20 1412 06/23/20 0444 06/24/20 0740 06/25/20 0435  WBC 7.2 4.8 10.6* 9.7  HGB 8.2* 8.6* 8.7* 8.5*  HCT 25.5* 25.8* 26.9* 27.5*  MCV 90.1 88.4 89.1 94.2  PLT 181 169 199 712*   Basic Metabolic Panel: Recent Labs  Lab 06/22/20 1412 06/22/20 2326 06/23/20 0444  06/24/20 0740 06/25/20 0435  NA 135 138 137 132*  133* 137  137  K 5.6* 4.7 4.8 5.5*  5.5* 4.8  4.8  CL 100 102 103 99  99 101  102  CO2 25 24 23 23  23 24  23   GLUCOSE 161* 145* 150* 207*  209* 136*  136*  BUN 64* 58* 59* 70*  73* 84*  85*  CREATININE 5.15* 4.86* 4.59* 5.66*  5.72* 6.09*  6.15*  CALCIUM 7.6* 7.8* 8.0* 7.9*  7.9* 7.4*  7.4*  PHOS  --   --   --  6.1* 6.1*   GFR: Estimated Creatinine Clearance: 7.8 mL/min (A) (by C-G formula based on SCr of 6.09 mg/dL (H)). Liver Function Tests: Recent Labs  Lab 06/22/20 1412 06/24/20 0740 06/25/20 0435  AST 10*  --   --   ALT 6  --   --   ALKPHOS 78  --   --   BILITOT 0.7  --   --   PROT 7.9  --   --   ALBUMIN 3.1* 2.9* 2.9*   No results for input(s): LIPASE, AMYLASE in the last 168 hours. No results for input(s): AMMONIA in the last 168 hours. Coagulation Profile: Recent Labs  Lab 06/23/20 0444  INR 1.0   Cardiac Enzymes: Recent Labs  Lab 06/23/20 1156  CKTOTAL 26*   BNP (last 3 results) No results for input(s): PROBNP in the last 8760 hours. HbA1C: Recent Labs    06/22/20 1412 06/23/20 0444  HGBA1C 5.3 5.4   CBG: Recent Labs  Lab 06/24/20 0727 06/24/20 1157 06/24/20 1602 06/24/20 2140 06/25/20 0741  GLUCAP 205* 197* 148* 140* 142*   Lipid Profile: No results for input(s): CHOL, HDL, LDLCALC, TRIG, CHOLHDL, LDLDIRECT in the last 72 hours. Thyroid Function Tests: Recent Labs    06/23/20 0444  TSH 3.275   Anemia Panel: Recent Labs    06/22/20 1412  FERRITIN 56  TIBC 302  IRON 33   Sepsis Labs: No results for input(s): PROCALCITON, LATICACIDVEN in the last 168 hours.  Recent Results (from the past 240 hour(s))  SARS Coronavirus 2 by RT PCR (hospital order, performed in [facility] hospital lab) Nasopharyngeal Nasopharyngeal Swab     Status: None   Collection Time: 06/22/20  4:33 PM   Specimen: Nasopharyngeal Swab  Result Value Ref Range Status   SARS Coronavirus 2  NEGATIVE NEGATIVE Final    Comment: (NOTE) SARS-CoV-2 target nucleic acids are NOT DETECTED.  The SARS-CoV-2 RNA is generally detectable in upper and lower respiratory specimens during the acute phase of infection. The lowest concentration of SARS-CoV-2 viral copies this assay can detect is 250 copies / mL. A negative result does not preclude SARS-CoV-2 infection and should not be used as the  sole basis for treatment or other patient management decisions.  A negative result may occur with improper specimen collection / handling, submission of specimen other than nasopharyngeal swab, presence of viral mutation(s) within the areas targeted by this assay, and inadequate number of viral copies (<250 copies / mL). A negative result must be combined with clinical observations, patient history, and epidemiological information.  Fact Sheet for Patients:   StrictlyIdeas.no  Fact Sheet for Healthcare Providers: BankingDealers.co.za  This test is not yet approved or  cleared by the [place] FDA and has been authorized for detection and/or diagnosis of SARS-CoV-2 by FDA under an Emergency Use Authorization (EUA).  This EUA will remain in effect (meaning this test can be used) for the duration of the COVID-19 declaration under Section 564(b)(1) of the Act, 21 U.S.C. section 360bbb-3(b)(1), unless the authorization is terminated or revoked sooner.  Performed at [facility], [address] [place], [place] [address]          Radiology Studies: DG Chest 2 View  Result Date: 06/23/2020 CLINICAL DATA:  CHF, acute kidney failure EXAM: CHEST - 2 VIEW COMPARISON:  May 04, 2016 FINDINGS: Post median sternotomy for CABG.  Stable cardiac enlargement. Mild interstitial prominence bilaterally. No lobar consolidation. Patchy opacity with linear characteristics in the LEFT lung base. No acute skeletal process on limited assessment.  IMPRESSION: 1. Cardiomegaly with mild interstitial prominence which may reflect mild edema. No lobar consolidation. 2. Areas of platelike in linear opacity at the LEFT lung base likely atelectasis. Electronically Signed   By: Zetta Bills M.D.   On: 06/23/2020 11:42   ECHOCARDIOGRAM COMPLETE  Result Date: 06/24/2020    ECHOCARDIOGRAM REPORT   Patient Name:   [name] Date of Exam: 06/23/2020 Medical Rec #:  [id number]         Height:       64.0 in Accession #:    [id number]        Weight:       207.0 lb Date of Birth:  [birth year]         BSA:          1.985 m Patient Age:    56 years          BP:           178/94 mmHg Patient Gender: F                 HR:           87 bpm. Exam Location:  [facility] Procedure: 2D Echo, Cardiac Doppler and Color Doppler Indications:     O27.74 Acute Diastolic CHF  History:         Patient has prior history of Echocardiogram examinations, most                  recent 07/17/2015. Prior CABG; Risk Factors:Hypertension,                  Dyslipidemia and Diabetes. Thyroid disease. Pneumonia. History                  of breast cancer. Dyspnea. Coronary artery disease. Chronic                  kidney disease.  Sonographer:     Wilford Sports Rodgers-Jones Referring Phys:  [id number] [place] Diagnosing Phys: Kate Sable MD IMPRESSIONS  1. Left ventricular ejection fraction, by estimation, is 60 to 65%. The left ventricle has normal function.  The left ventricle has no regional wall motion abnormalities. Left ventricular diastolic parameters were normal.  2. Right ventricular systolic function is normal. The right ventricular size is normal. There is normal pulmonary artery systolic pressure.  3. The mitral valve is normal in structure. No evidence of mitral valve regurgitation. No evidence of mitral stenosis.  4. The aortic valve was not well visualized. Aortic valve regurgitation is not visualized.  5. The inferior vena cava is normal in size with greater than 50% respiratory  variability, suggesting right atrial pressure of 3 mmHg. FINDINGS  Left Ventricle: Left ventricular ejection fraction, by estimation, is 60 to 65%. The left ventricle has normal function. The left ventricle has no regional wall motion abnormalities. The left ventricular internal cavity size was normal in size. There is  no left ventricular hypertrophy. Left ventricular diastolic parameters were normal. Right Ventricle: The right ventricular size is normal. No increase in right ventricular wall thickness. Right ventricular systolic function is normal. There is normal pulmonary artery systolic pressure. The tricuspid regurgitant velocity is 2.74 m/s, and  with an assumed right atrial pressure of 3 mmHg, the estimated right ventricular systolic pressure is 30.1 mmHg. Left Atrium: Left atrial size was normal in size. Right Atrium: Right atrial size was normal in size. Pericardium: There is no evidence of pericardial effusion. Mitral Valve: The mitral valve is normal in structure. No evidence of mitral valve regurgitation. No evidence of mitral valve stenosis. Tricuspid Valve: The tricuspid valve is normal in structure. Tricuspid valve regurgitation is not demonstrated. No evidence of tricuspid stenosis. Aortic Valve: The aortic valve was not well visualized. Aortic valve regurgitation is not visualized. Pulmonic Valve: The pulmonic valve was normal in structure. Pulmonic valve regurgitation is not visualized. No evidence of pulmonic stenosis. Aorta: The aortic root is normal in size and structure. Venous: The inferior vena cava is normal in size with greater than 50% respiratory variability, suggesting right atrial pressure of 3 mmHg. IAS/Shunts: No atrial level shunt detected by color flow Doppler.  LEFT VENTRICLE PLAX 2D LVIDd:         5.03 cm  Diastology LVIDs:         2.94 cm  LV e' medial:    8.38 cm/s LV PW:         0.99 cm  LV E/e' medial:  11.6 LV IVS:        0.91 cm  LV e' lateral:   9.36 cm/s LVOT diam:      2.00 cm  LV E/e' lateral: 10.4 LV SV:         61 LV SV Index:   31 LVOT Area:     3.14 cm  RIGHT VENTRICLE             IVC RV Basal diam:  3.51 cm     IVC diam: 1.51 cm RV S prime:     15.00 cm/s TAPSE (M-mode): 2.1 cm LEFT ATRIUM             Index       RIGHT ATRIUM           Index LA diam:        4.30 cm 2.17 cm/m  RA Area:     15.90 cm LA Vol (A2C):   57.4 ml 28.91 ml/m RA Volume:   48.10 ml  24.23 ml/m LA Vol (A4C):   58.5 ml 29.47 ml/m LA Biplane Vol: 59.5 ml 29.97 ml/m  AORTIC VALVE LVOT Vmax:  79.30 cm/s LVOT Vmean:  56.600 cm/s LVOT VTI:    0.195 m  AORTA Ao Root diam: 2.60 cm Ao Asc diam:  3.20 cm MITRAL VALVE                TRICUSPID VALVE MV Area (PHT): 3.42 cm     TR Peak grad:   30.0 mmHg MV Decel Time: 222 msec     TR Vmax:        274.00 cm/s MV E velocity: 97.30 cm/s MV A velocity: 117.00 cm/s  SHUNTS MV E/A ratio:  0.83         Systemic VTI:  0.20 m                             Systemic Diam: 2.00 cm Kate Sable MD Electronically signed by Kate Sable MD Signature Date/Time: 06/24/2020/12:36:36 PM    Final         Scheduled Meds: . Chlorhexidine Gluconate Cloth  6 each Topical Q0600  . docusate sodium  200 mg Oral Daily  . insulin aspart  0-9 Units Subcutaneous TID WC  . levothyroxine  150 mcg Oral QAC breakfast  . melatonin  5 mg Oral QHS  . metoprolol tartrate  50 mg Oral Q8H  . sodium chloride flush  3 mL Intravenous Q12H   Continuous Infusions: . sodium chloride       LOS: 3 days    Time spent: 32 mins     Wyvonnia Dusky, MD [facility] Pager [phone]  If 7PM-7AM, please contact night-coverage [email or web address] 06/25/2020, 7:59 AM

## 2020-06-25 NOTE — Progress Notes (Signed)
Pt laying in bed, talking with attending MD. Scheduled meds given.  Pt denies any further needs at this time. Call bell within reach, RN number on pt's board

## 2020-06-26 LAB — GLUCOSE, CAPILLARY
Glucose-Capillary: 126 mg/dL — ABNORMAL HIGH (ref 70–99)
Glucose-Capillary: 102 mg/dL — ABNORMAL HIGH (ref 70–99)

## 2020-06-26 LAB — BASIC METABOLIC PANEL
Anion gap: 9 (ref 5–15)
BUN: 69 mg/dL — ABNORMAL HIGH (ref 8–23)
CO2: 26 mmol/L (ref 22–32)
Calcium: 7.2 mg/dL — ABNORMAL LOW (ref 8.9–10.3)
Chloride: 103 mmol/L (ref 98–111)
Creatinine, Ser: 5.63 mg/dL — ABNORMAL HIGH (ref 0.44–1.00)
GFR calc Af Amer: 7 mL/min — ABNORMAL LOW (ref >60)
GFR calc non Af Amer: 6 mL/min — ABNORMAL LOW (ref >60)
Glucose, Bld: 135 mg/dL — ABNORMAL HIGH (ref 70–99)
Potassium: 4.7 mmol/L (ref 3.5–5.1)
Sodium: 138 mmol/L (ref 135–145)

## 2020-06-26 LAB — CBC
HCT: 25.8 % — ABNORMAL LOW (ref 36.0–46.0)
Hemoglobin: 8.5 g/dL — ABNORMAL LOW (ref 12.0–15.0)
MCH: 29.1 pg (ref 26.0–34.0)
MCHC: 32.9 g/dL (ref 30.0–36.0)
MCV: 88.4 fL (ref 80.0–100.0)
Platelets: 203 10*3/uL (ref 150–400)
RBC: 2.92 MIL/uL — ABNORMAL LOW (ref 3.87–5.11)
RDW: 15.7 % — ABNORMAL HIGH (ref 11.5–15.5)
WBC: 10.8 10*3/uL — ABNORMAL HIGH (ref 4.0–10.5)
nRBC: 0 % (ref 0.0–0.2)

## 2020-06-26 MED ORDER — PROMETHAZINE HCL 25 MG/ML IJ SOLN
12.5000 mg | Freq: Four times a day (QID) | INTRAMUSCULAR | Status: DC | PRN
  Administered 2020-06-26: 12.5 mg via INTRAVENOUS

## 2020-06-26 MED ORDER — EPOETIN ALFA 10000 UNIT/ML IJ SOLN: 4000.0000 [IU] | INTRAMUSCULAR | Status: DC

## 2020-06-26 MED ORDER — EPOETIN ALFA 10000 UNIT/ML IJ SOLN
4000.0000 [IU] | INTRAMUSCULAR | Status: DC
  Administered 2020-06-26 – 2020-06-29 (×2): 4000 [IU] via INTRAVENOUS
  Filled 2020-06-26: qty 1

## 2020-06-26 NOTE — Plan of Care (Signed)

## 2020-06-26 NOTE — Progress Notes (Signed)
PROGRESS NOTE    [name]  KYH:[id number] DOB: Sep 24, 1934 DOA: 06/22/2020 PCP: Idelle Crouch, MD   Assessment & Plan:   Principal Problem:   Hyperkalemia Active Problems:   Diabetes mellitus ([place])   Hypertension   S/P CABG x 4   Cerebral embolism with cerebral infarction (HCC)   CAD (coronary artery disease)   Thyroid disease   HTN, goal below 140/80   CKD (chronic kidney disease) stage 5, GFR less than 15 ml/min (HCC)   Carotid stenosis   Prolonged QT interval   AKI on CKDIIIb: currently stage V, w/ hematuria. 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 Cr is labile. S/p permacath 06/25/20 & HD today again. W/ hx of ANCA pauci-immune glomerulonephritis. Completed course of stress dose steroids. Kidney biopsy will be on 06/28/20 as pt needs to be off of plavix for 5 days. Nephro following and recs apprec   ACD: likely secondary to CKD. No need for a transfusion at this time.   Hx of ANCA vasculitis: not on any treatments outpatient as per med rec  Hyperkalemia: WNL today. Secondary to CKD   Thrombocytopenia: resolved   CAD: s/p CABG. Continue on home dose of metoprolol. Continue to hold aspirn, plavix   Hx of CVA: recent July 2021 w/ residual right arm weakness. Hold aspirin, plavix as per nephro   Carotid stenosis: recent IR procedure showed 30-40% left ICA stenosis, no acute intervention needed  DM2: continue to hold home dose of glimepiride. Continue on SSI w/ accuchecks   Chronic pain: continue on norco, IV morphine prn   Anxiety: severity unknown. 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 Continue to hold home dose of lasix secondary CKD   Hypothyroidism: continue on home dose of synthroid       DVT prophylaxis: heparin SQ Code Status: full Family Communication: discussed pt's care w/ pt's husband  Again who is at bedside and answered his questions  Disposition Plan: depends on PT/OT  recs  Status is: Inpatient  Remains inpatient appropriate because:Ongoing diagnostic testing needed not appropriate for outpatient work up, IV treatments appropriate due to intensity of illness or inability to take PO and Inpatient level of care appropriate due to severity of illness   Dispo: The patient is from: Home              Anticipated d/c is to: Home vs SNF              Anticipated d/c date is: > 3 days              Patient currently is not medically stable to d/c.    Consultants:   nephro   Vascular surg   Procedures:   Antimicrobials:    Subjective: Pt c/o nausea  Objective: Vitals:   06/26/20 0700 06/26/20 0741 06/26/20 1030 06/26/20 1130  BP: (!) 156/82 (!) 168/77 (!) 177/75   Pulse: 80 81 88   Resp:  16 19   Temp:  97.6 F (36.4 C) 97.6 F (36.4 C)   TempSrc:  Oral Oral   SpO2:  92% 98% 95%  Weight:      Height:        Intake/Output Summary (Last 24 hours) at 06/26/2020 1249 Last data filed at 06/26/2020 0541 Gross per 24 hour  Intake 210 ml  Output 200 ml  Net 10 ml   Filed Weights   06/25/20 1400  Weight: 93.9 kg    Examination:  General exam: Appears calm but uncomfortable Respiratory system: clear breath sounds b/l. No rales  Cardiovascular system:  S1/S2+. No gallops or rubs Gastrointestinal system: Abdomen is nondistended, soft and nontender. Hyperactive bowel sounds  Central nervous system: Alert and oriented. Moves all 4 extremities   Psychiatry: Judgement and insight appear normal. Flat mood and affect     Data Reviewed: I have personally reviewed following labs and imaging studies  CBC: Recent Labs  Lab 06/22/20 1412 06/23/20 0444 06/24/20 0740 06/25/20 0435 06/26/20 0719  WBC 7.2 4.8 10.6* 9.7 10.8*  HGB 8.2* 8.6* 8.7* 8.5* 8.5*  HCT 25.5* 25.8* 26.9* 27.5* 25.8*  MCV 90.1 88.4 89.1 94.2 88.4  PLT 181 169 199 110* 979   Basic Metabolic Panel: Recent Labs  Lab 06/22/20 2326 06/23/20 0444 06/24/20 0740  06/25/20 0435 06/26/20 0719  NA 138 137 132*  133* 137  137 138  K 4.7 4.8 5.5*  5.5* 4.8  4.8 4.7  CL 102 103 99  99 101  102 103  CO2 24 23 23  23 24  23 26   GLUCOSE 145* 150* 207*  209* 136*  136* 135*  BUN 58* 59* 70*  73* 84*  85* 69*  CREATININE 4.86* 4.59* 5.66*  5.72* 6.09*  6.15* 5.63*  CALCIUM 7.8* 8.0* 7.9*  7.9* 7.4*  7.4* 7.2*  PHOS  --   --  6.1* 6.1*  --    GFR: Estimated Creatinine Clearance: 8.4 mL/min (A) (by C-G formula based on SCr of 5.63 mg/dL (H)). Liver Function Tests: Recent Labs  Lab 06/22/20 1412 06/24/20 0740 06/25/20 0435  AST 10*  --   --   ALT 6  --   --   ALKPHOS 78  --   --   BILITOT 0.7  --   --   PROT 7.9  --   --   ALBUMIN 3.1* 2.9* 2.9*   No results for input(s): LIPASE, AMYLASE in the last 168 hours. No results for input(s): AMMONIA in the last 168 hours. Coagulation Profile: Recent Labs  Lab 06/23/20 0444  INR 1.0   Cardiac Enzymes: Recent Labs  Lab 06/23/20 1156  CKTOTAL 26*   BNP (last 3 results) No results for input(s): PROBNP in the last 8760 hours. HbA1C: No results for input(s): HGBA1C in the last 72 hours. CBG: Recent Labs  Lab 06/25/20 1128 06/25/20 1359 06/25/20 1537 06/25/20 2133 06/26/20 0737  GLUCAP 125* 106* 139* 125* 126*   Lipid Profile: No results for input(s): CHOL, HDL, LDLCALC, TRIG, CHOLHDL, LDLDIRECT in the last 72 hours. Thyroid Function Tests: No results for input(s): TSH, T4TOTAL, FREET4, T3FREE, THYROIDAB in the last 72 hours. Anemia Panel: Recent Labs    06/25/20 1430  FERRITIN 67  TIBC 283  IRON 36   Sepsis Labs: No results for input(s): PROCALCITON, LATICACIDVEN in the last 168 hours.  Recent Results (from the past 240 hour(s))  SARS Coronavirus 2 by RT PCR (hospital order, performed in [facility] hospital lab) Nasopharyngeal Nasopharyngeal Swab     Status: None   Collection Time: 06/22/20  4:33 PM   Specimen: Nasopharyngeal Swab  Result Value Ref Range Status    SARS Coronavirus 2 NEGATIVE NEGATIVE Final    Comment: (NOTE) SARS-CoV-2 target nucleic acids are NOT DETECTED.  The SARS-CoV-2 RNA is generally detectable in upper and lower respiratory specimens during the acute phase of infection. The lowest concentration of SARS-CoV-2 viral copies this assay can detect is 250 copies / mL. A negative  result does not preclude SARS-CoV-2 infection and should not be used as the sole basis for treatment or other patient management decisions.  A negative result may occur with improper specimen collection / handling, submission of specimen other than nasopharyngeal swab, presence of viral mutation(s) within the areas targeted by this assay, and inadequate number of viral copies (<250 copies / mL). A negative result must be combined with clinical observations, patient history, and epidemiological information.  Fact Sheet for Patients:   StrictlyIdeas.no  Fact Sheet for Healthcare Providers: BankingDealers.co.za  This test is not yet approved or  cleared by the [place] FDA and has been authorized for detection and/or diagnosis of SARS-CoV-2 by FDA under an Emergency Use Authorization (EUA).  This EUA will remain in effect (meaning this test can be used) for the duration of the COVID-19 declaration under Section 564(b)(1) of the Act, 21 U.S.C. section 360bbb-3(b)(1), unless the authorization is terminated or revoked sooner.  Performed at [facility], [address] [place], [place] [address]          Radiology Studies: PERIPHERAL VASCULAR CATHETERIZATION  Result Date: 06/25/2020 See op note       Scheduled Meds: . Chlorhexidine Gluconate Cloth  6 each Topical Q0600  . docusate sodium  200 mg Oral Daily  . [START ON 06/29/2020] epoetin (EPOGEN/PROCRIT) injection  4,000 Units Intravenous Q T,Th,Sa-HD  . insulin aspart  0-9 Units Subcutaneous TID WC  . levothyroxine  150  mcg Oral QAC breakfast  . melatonin  5 mg Oral QHS  . metoprolol tartrate  50 mg Oral Q8H  . scopolamine  1 patch Transdermal Q72H  . sodium chloride flush  3 mL Intravenous Q12H   Continuous Infusions: . sodium chloride       LOS: 4 days    Time spent: 30 mins     Wyvonnia Dusky, MD [facility] Pager [phone]  If 7PM-7AM, please contact night-coverage [email or web address] 06/26/2020, 12:49 PM

## 2020-06-26 NOTE — Progress Notes (Addendum)
MCKINZIE SAKSA  MRN: [id number]  DOB/AGE: 1934/06/07 86 y.o.  Primary Care Physician:Sparks, Leonie Douglas, MD  Admit date: 06/22/2020  Chief Complaint:  Chief Complaint  Patient presents with  . Abnormal Lab    S-Pt presented on  06/22/2020 with  Chief Complaint  Patient presents with  . Abnormal Lab  . Patient main concern in today visit was" I feel nauseated."  I then coordinated with the RN to please give the patient her as needed antinausea medication. Patient had no other physical concerns  Medications . Chlorhexidine Gluconate Cloth  6 each Topical Q0600  . docusate sodium  200 mg Oral Daily  . insulin aspart  0-9 Units Subcutaneous TID WC  . levothyroxine  150 mcg Oral QAC breakfast  . melatonin  5 mg Oral QHS  . metoprolol tartrate  50 mg Oral Q8H  . scopolamine  1 patch Transdermal Q72H  . sodium chloride flush  3 mL Intravenous Q12H         [id number] from the symptoms mentioned above,there are no other symptoms referable to all systems reviewed.  Physical Exam: Vital signs in last 24 hours: Temp:  [97.5 F (36.4 C)-98.7 F (37.1 C)] 97.6 F (36.4 C) (09/25 0741) Pulse Rate:  [75-92] 81 (09/25 0741) Resp:  [15-24] 16 (09/25 0741) BP: (103-191)/(65-105) 168/77 (09/25 0741) SpO2:  [92 %-100 %] 92 % (09/25 0741) Weight:  [93.9 kg] 93.9 kg (09/24 1400) Weight change:  Last BM Date: 06/24/20  Intake/Output from previous day: 09/24 0701 - 09/25 0700 In: 210 [P.O.:210] Out: 700 [Urine:700] No intake/output data recorded.   Physical Exam: General- pt is awake,alert, oriented to time place and person Resp- No acute REsp distress, CTA B/L NO Rhonchi CVS- S1S2 regular in rate and rhythm GIT- BS+, soft, NT, ND EXT- NO LE Edema, Cyanosis Access tunneled catheter in situ  Lab Results: CBC Recent Labs    06/24/20 0740 06/25/20 0435  WBC 10.6* 9.7  HGB 8.7* 8.5*  HCT 26.9* 27.5*  PLT 199 110*    BMET Recent Labs    06/25/20 0435  06/26/20 0719  NA 137  137 138  K 4.8  4.8 4.7  CL 101  102 103  CO2 24  23 26   GLUCOSE 136*  136* 135*  BUN 84*  85* 69*  CREATININE 6.09*  6.15* 5.63*  CALCIUM 7.4*  7.4* 7.2*    MICRO Recent Results (from the past 240 hour(s))  SARS Coronavirus 2 by RT PCR (hospital order, performed in [facility] hospital lab) Nasopharyngeal Nasopharyngeal Swab     Status: None   Collection Time: 06/22/20  4:33 PM   Specimen: Nasopharyngeal Swab  Result Value Ref Range Status   SARS Coronavirus 2 NEGATIVE NEGATIVE Final    Comment: (NOTE) SARS-CoV-2 target nucleic acids are NOT DETECTED.  The SARS-CoV-2 RNA is generally detectable in upper and lower respiratory specimens during the acute phase of infection. The lowest concentration of SARS-CoV-2 viral copies this assay can detect is 250 copies / mL. A negative result does not preclude SARS-CoV-2 infection and should not be used as the sole basis for treatment or other patient management decisions.  A negative result may occur with improper specimen collection / handling, submission of specimen other than nasopharyngeal swab, presence of viral mutation(s) within the areas targeted by this assay, and inadequate number of viral copies (<250 copies / mL). A negative result must be combined with clinical observations, patient history, and epidemiological information.  Fact Sheet  for Patients:   StrictlyIdeas.no  Fact Sheet for Healthcare Providers: BankingDealers.co.za  This test is not yet approved or  cleared by the [place] FDA and has been authorized for detection and/or diagnosis of SARS-CoV-2 by FDA under an Emergency Use Authorization (EUA).  This EUA will remain in effect (meaning this test can be used) for the duration of the COVID-19 declaration under Section 564(b)(1) of the Act, 21 U.S.C. section 360bbb-3(b)(1), unless the authorization is terminated or revoked  sooner.  Performed at [facility], [address] [place], [place] [address]       Lab Results  Component Value Date   PTH 99 (H) 04/30/2016   CALCIUM 7.2 (L) 06/26/2020   CAION 1.18 09/06/2012   PHOS 6.1 (H) 06/25/2020               Impression:   Ms. REYES FIFIELD is a 86 y.o.  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 Patient had tunneled catheter placed yesterday-06/25/2020 Patient had her first hemodialysis session on June 25 2020 Patient will need renal replacement therapy today Patient had CKD stage IIIb Patient has CKD stage IIIb secondary 2015 Patient CKD was thought to be secondary to ANCA positive GN-biopsy-proven  Plan is to have another renal biopsy on Monday  2)HTN Patient blood pressure on the higher side Patient hydralazine is now being  Held.  We will follow for now, and if patient remains hypertensive then will start amlodipine  3)Anemia of chronic disease  HGb is not at goal (9--11) Patient is on Epogen  4) secondary hyperparathyroidism -CKD Mineral-Bone Disorder   Secondary Hyperparathyroidism  present Phosphorus is not at goal. Initiating renal placement therapy will help  5) MGUS Patient will benefit from  hematology consult as an outpatient  6) electrolytes   sodium Normonatremic   potassium Normokalemic    7)Acid base Co2 at goal     Plan:   We will dialyze patient today   Addendum Pt seen on HD  Patient tolerating treatment well   Narmeen Kerper s [facility] 06/26/2020, 10:34 AM

## 2020-06-27 LAB — CBC
HCT: 27.5 % — ABNORMAL LOW (ref 36.0–46.0)
Hemoglobin: 8.8 g/dL — ABNORMAL LOW (ref 12.0–15.0)
MCH: 28.9 pg (ref 26.0–34.0)
MCHC: 32 g/dL (ref 30.0–36.0)
MCV: 90.2 fL (ref 80.0–100.0)
Platelets: 180 10*3/uL (ref 150–400)
RBC: 3.05 MIL/uL — ABNORMAL LOW (ref 3.87–5.11)
RDW: 15.4 % (ref 11.5–15.5)
WBC: 14.2 10*3/uL — ABNORMAL HIGH (ref 4.0–10.5)
nRBC: 0 % (ref 0.0–0.2)

## 2020-06-27 LAB — BASIC METABOLIC PANEL
Anion gap: 9 (ref 5–15)
BUN: 48 mg/dL — ABNORMAL HIGH (ref 8–23)
CO2: 27 mmol/L (ref 22–32)
Calcium: 7.4 mg/dL — ABNORMAL LOW (ref 8.9–10.3)
Chloride: 103 mmol/L (ref 98–111)
Creatinine, Ser: 5.27 mg/dL — ABNORMAL HIGH (ref 0.44–1.00)
GFR calc Af Amer: 8 mL/min — ABNORMAL LOW (ref >60)
GFR calc non Af Amer: 7 mL/min — ABNORMAL LOW (ref >60)
Glucose, Bld: 137 mg/dL — ABNORMAL HIGH (ref 70–99)
Potassium: 4.6 mmol/L (ref 3.5–5.1)
Sodium: 139 mmol/L (ref 135–145)

## 2020-06-27 LAB — GLUCOSE, CAPILLARY
Glucose-Capillary: 123 mg/dL — ABNORMAL HIGH (ref 70–99)
Glucose-Capillary: 119 mg/dL — ABNORMAL HIGH (ref 70–99)
Glucose-Capillary: 116 mg/dL — ABNORMAL HIGH (ref 70–99)
Glucose-Capillary: 110 mg/dL — ABNORMAL HIGH (ref 70–99)

## 2020-06-27 LAB — PROTIME-INR
INR: 1 (ref 0.8–1.2)
Prothrombin Time: 12.8 seconds (ref 11.4–15.2)

## 2020-06-27 MED ORDER — AMLODIPINE BESYLATE 5 MG PO TABS
5.0000 mg | ORAL_TABLET | Freq: Every day | ORAL | Status: DC
  Administered 2020-06-27 – 2020-07-02 (×4): 5 mg via ORAL
  Filled 2020-06-27 (×5): qty 1

## 2020-06-27 NOTE — Progress Notes (Signed)
PROGRESS NOTE    [name]  NWG:[id number] DOB: [birth year] DOA: 06/22/2020 PCP: Idelle Crouch, MD   Assessment & Plan:   Principal Problem:   Hyperkalemia Active Problems:   Diabetes mellitus ([place])   Hypertension   S/P CABG x 4   Cerebral embolism with cerebral infarction (HCC)   CAD (coronary artery disease)   Thyroid disease   HTN, goal below 140/80   CKD (chronic kidney disease) stage 5, GFR less than 15 ml/min (HCC)   Carotid stenosis   Prolonged QT interval   AKI on CKDIIIb: currently stage V, w/ hematuria. 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 Cr is labile. S/p permacath 06/25/20. Continue on HD as per nephro. W/ hx of ANCA pauci-immune glomerulonephritis. Completed course of stress dose steroids. Kidney biopsy will be on 06/28/20 as pt needs to be off of plavix for 5 days. Nephro following and recs apprec   ACD: likely secondary to CKD. No need for a transfusion at this time.   Hx of ANCA vasculitis: not on any treatments outpatient as per med rec  Hyperkalemia: within normal limits today. Will continue to monitor   Thrombocytopenia: resolved   CAD: s/p CABG. Continue on home dose of metoprolol. Continue to hold aspirn, plavix   Hx of CVA: recent July 2021 w/ residual right arm weakness. Hold aspirin, plavix as per nephro   Carotid stenosis: recent IR procedure showed 30-40% left ICA stenosis, no acute intervention needed  DM2: continue to hold home dose of glimepiride. Continue on SSI w/ accuchecks   Chronic pain: continue on norco, morphine prn   Anxiety: severity unknown. 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 Continue to hold home dose of lasix secondary CKD   Hypothyroidism: continue on home dose of levothyroxine      DVT prophylaxis: heparin SQ Code Status: full Family Communication: discussed w/ pt's husband again who is at bedside  Disposition Plan: depends on PT/OT  recs  Status is: Inpatient  Remains inpatient appropriate because:Ongoing diagnostic testing needed not appropriate for outpatient work up, IV treatments appropriate due to intensity of illness or inability to take PO and Inpatient level of care appropriate due to severity of illness   Dispo: The patient is from: Home              Anticipated d/c is to: Home vs SNF              Anticipated d/c date is: > 3 days              Patient currently is not medically stable to d/c.    Consultants:   nephro   Vascular surg   Procedures:   Antimicrobials:    Subjective: Pt c/o nausea again today   Objective: Vitals:   06/26/20 1659 06/26/20 2032 06/26/20 2358 06/27/20 0434  BP: (!) 156/72 (!) 159/89 (!) 168/85 (!) 163/77  Pulse: (!) 105 (!) 106 96 77  Resp: 18 20 18 17   Temp: 99.9 F (37.7 C) 98.1 F (36.7 C) 98.2 F (36.8 C) (!) 97.5 F (36.4 C)  TempSrc: Oral Oral Oral Oral  SpO2: 91% 91% 93% 94%  Weight:      Height:        Intake/Output Summary (Last 24 hours) at 06/27/2020 0734 Last data filed at 06/26/2020 1300 Gross per 24 hour  Intake --  Output 0 ml  Net 0 ml   Filed Weights   06/25/20 1400  Weight: 93.9 kg    Examination:  General exam: Appears uncomfortable Respiratory system: clear breath sounds b/l. No wheezes, rales Cardiovascular system:  S1/S2+. No rubs or gallops Gastrointestinal system: Abdomen is nondistended, soft and nontender. Normal bowel sounds  Central nervous system: Alert and oriented. Moves all 4 extremities  Psychiatry: Judgement and insight appear normal. Flat mood and affect     Data Reviewed: I have personally reviewed following labs and imaging studies  CBC: Recent Labs  Lab 06/23/20 0444 06/24/20 0740 06/25/20 0435 06/26/20 0719 06/27/20 0316  WBC 4.8 10.6* 9.7 10.8* 14.2*  HGB 8.6* 8.7* 8.5* 8.5* 8.8*  HCT 25.8* 26.9* 27.5* 25.8* 27.5*  MCV 88.4 89.1 94.2 88.4 90.2  PLT 169 199 110* 203 601   Basic Metabolic  Panel: Recent Labs  Lab 06/23/20 0444 06/24/20 0740 06/25/20 0435 06/26/20 0719 06/27/20 0316  NA 137 132*  133* 137  137 138 139  K 4.8 5.5*  5.5* 4.8  4.8 4.7 4.6  CL 103 99  99 101  102 103 103  CO2 23 23  23 24  23 26 27   GLUCOSE 150* 207*  209* 136*  136* 135* 137*  BUN 59* 70*  73* 84*  85* 69* 48*  CREATININE 4.59* 5.66*  5.72* 6.09*  6.15* 5.63* 5.27*  CALCIUM 8.0* 7.9*  7.9* 7.4*  7.4* 7.2* 7.4*  PHOS  --  6.1* 6.1*  --   --    GFR: Estimated Creatinine Clearance: 9 mL/min (A) (by C-G formula based on SCr of 5.27 mg/dL (H)). Liver Function Tests: Recent Labs  Lab 06/22/20 1412 06/24/20 0740 06/25/20 0435  AST 10*  --   --   ALT 6  --   --   ALKPHOS 78  --   --   BILITOT 0.7  --   --   PROT 7.9  --   --   ALBUMIN 3.1* 2.9* 2.9*   No results for input(s): LIPASE, AMYLASE in the last 168 hours. No results for input(s): AMMONIA in the last 168 hours. Coagulation Profile: Recent Labs  Lab 06/23/20 0444  INR 1.0   Cardiac Enzymes: Recent Labs  Lab 06/23/20 1156  CKTOTAL 26*   BNP (last 3 results) No results for input(s): PROBNP in the last 8760 hours. HbA1C: No results for input(s): HGBA1C in the last 72 hours. CBG: Recent Labs  Lab 06/25/20 1359 06/25/20 1537 06/25/20 2133 06/26/20 0737 06/26/20 1659  GLUCAP 106* 139* 125* 126* 102*   Lipid Profile: No results for input(s): CHOL, HDL, LDLCALC, TRIG, CHOLHDL, LDLDIRECT in the last 72 hours. Thyroid Function Tests: No results for input(s): TSH, T4TOTAL, FREET4, T3FREE, THYROIDAB in the last 72 hours. Anemia Panel: Recent Labs    06/25/20 1430  FERRITIN 67  TIBC 283  IRON 36   Sepsis Labs: No results for input(s): PROCALCITON, LATICACIDVEN in the last 168 hours.  Recent Results (from the past 240 hour(s))  SARS Coronavirus 2 by RT PCR (hospital order, performed in [facility] hospital lab) Nasopharyngeal Nasopharyngeal Swab     Status: None   Collection Time: 06/22/20   4:33 PM   Specimen: Nasopharyngeal Swab  Result Value Ref Range Status   SARS Coronavirus 2 NEGATIVE NEGATIVE Final    Comment: (NOTE) SARS-CoV-2 target nucleic acids are NOT DETECTED.  The SARS-CoV-2 RNA is generally detectable in upper and lower respiratory specimens during the acute phase of infection. The lowest concentration of SARS-CoV-2 viral copies this assay can detect is 250  copies / mL. A negative result does not preclude SARS-CoV-2 infection and should not be used as the sole basis for treatment or other patient management decisions.  A negative result may occur with improper specimen collection / handling, submission of specimen other than nasopharyngeal swab, presence of viral mutation(s) within the areas targeted by this assay, and inadequate number of viral copies (<250 copies / mL). A negative result must be combined with clinical observations, patient history, and epidemiological information.  Fact Sheet for Patients:   StrictlyIdeas.no  Fact Sheet for Healthcare Providers: BankingDealers.co.za  This test is not yet approved or  cleared by the [place] FDA and has been authorized for detection and/or diagnosis of SARS-CoV-2 by FDA under an Emergency Use Authorization (EUA).  This EUA will remain in effect (meaning this test can be used) for the duration of the COVID-19 declaration under Section 564(b)(1) of the Act, 21 U.S.C. section 360bbb-3(b)(1), unless the authorization is terminated or revoked sooner.  Performed at [facility], [address], [place], [place] [address]          Radiology Studies: PERIPHERAL VASCULAR CATHETERIZATION  Result Date: 06/25/2020 See op note       Scheduled Meds: . Chlorhexidine Gluconate Cloth  6 each Topical Q0600  . docusate sodium  200 mg Oral Daily  . [START ON 06/29/2020] epoetin (EPOGEN/PROCRIT) injection  4,000 Units Intravenous Q T,Th,Sa-HD   . insulin aspart  0-9 Units Subcutaneous TID WC  . levothyroxine  150 mcg Oral QAC breakfast  . melatonin  5 mg Oral QHS  . metoprolol tartrate  50 mg Oral Q8H  . scopolamine  1 patch Transdermal Q72H  . sodium chloride flush  3 mL Intravenous Q12H   Continuous Infusions: . sodium chloride       LOS: 5 days    Time spent: 31 mins     Wyvonnia Dusky, MD [facility] Pager [phone]  If 7PM-7AM, please contact night-coverage [email or web address] 06/27/2020, 7:34 AM

## 2020-06-27 NOTE — Progress Notes (Addendum)
[name]  MRN: [id number]  DOB/AGE: 1934-08-19 86 y.o.  Primary Care Physician:Sparks, Leonie Douglas, MD  Admit date: 06/22/2020  Chief Complaint:  Chief Complaint  Patient presents with   Abnormal Lab    S-Pt presented on  06/22/2020 with  Chief Complaint  Patient presents with   Abnormal Lab  .  Patient had no  physical concerns.  Patient made a comment " loose like a goose".  On further questioning patient said that she is feeling better than before. Patient's husband was present in the room as well.  Patient husband asked me questions about patient kidney function/dialysis/how long will she need dialysis/what about her kidney biopsy.  Patient is scheduled to undergo kidney biopsy tomorrow morning I answered patient and her husband questions to the best of my ability.   Medications  Chlorhexidine Gluconate Cloth  6 each Topical Q0600   docusate sodium  200 mg Oral Daily   [START ON 06/29/2020] epoetin (EPOGEN/PROCRIT) injection  4,000 Units Intravenous Q T,Th,Sa-HD   insulin aspart  0-9 Units Subcutaneous TID WC   levothyroxine  150 mcg Oral QAC breakfast   melatonin  5 mg Oral QHS   metoprolol tartrate  50 mg Oral Q8H   scopolamine  1 patch Transdermal Q72H   sodium chloride flush  3 mL Intravenous Q12H         FUX:NATFT from the symptoms mentioned above,there are no other symptoms referable to all systems reviewed.  Physical Exam: Vital signs in last 24 hours: Temp:  [97.5 F (36.4 C)-99.9 F (37.7 C)] 98.2 F (36.8 C) (09/26 0826) Pulse Rate:  [77-106] 93 (09/26 0826) Resp:  [16-20] 16 (09/26 0826) BP: (156-187)/(72-89) 187/79 (09/26 0826) SpO2:  [91 %-95 %] 92 % (09/26 0826) Weight change:  Last BM Date: 06/27/20  Intake/Output from previous day: No intake/output data recorded. No intake/output data recorded.   Physical Exam: General- pt is awake,alert, oriented to time place and person Resp- No acute REsp distress, CTA B/L NO  Rhonchi CVS- S1S2 regular in rate and rhythm GIT- BS+, soft, NT, ND EXT- NO LE Edema, Cyanosis Access tunneled catheter in situ  Lab Results: CBC Recent Labs    06/26/20 0719 06/27/20 0316  WBC 10.8* 14.2*  HGB 8.5* 8.8*  HCT 25.8* 27.5*  PLT 203 180    BMET Recent Labs    06/26/20 0719 06/27/20 0316  NA 138 139  K 4.7 4.6  CL 103 103  CO2 26 27  GLUCOSE 135* 137*  BUN 69* 48*  CREATININE 5.63* 5.27*  CALCIUM 7.2* 7.4*    MICRO Recent Results (from the past 240 hour(s))  SARS Coronavirus 2 by RT PCR (hospital order, performed in [facility] hospital lab) Nasopharyngeal Nasopharyngeal Swab     Status: None   Collection Time: 06/22/20  4:33 PM   Specimen: Nasopharyngeal Swab  Result Value Ref Range Status   SARS Coronavirus 2 NEGATIVE NEGATIVE Final    Comment: (NOTE) SARS-CoV-2 target nucleic acids are NOT DETECTED.  The SARS-CoV-2 RNA is generally detectable in upper and lower respiratory specimens during the acute phase of infection. The lowest concentration of SARS-CoV-2 viral copies this assay can detect is 250 copies / mL. A negative result does not preclude SARS-CoV-2 infection and should not be used as the sole basis for treatment or other patient management decisions.  A negative result may occur with improper specimen collection / handling, submission of specimen other than nasopharyngeal swab, presence of viral mutation(s) within the  areas targeted by this assay, and inadequate number of viral copies (<250 copies / mL). A negative result must be combined with clinical observations, patient history, and epidemiological information.  Fact Sheet for Patients:   StrictlyIdeas.no  Fact Sheet for Healthcare Providers: BankingDealers.co.za  This test is not yet approved or  cleared by the [place] FDA and has been authorized for detection and/or diagnosis of SARS-CoV-2 by FDA under an Emergency Use  Authorization (EUA).  This EUA will remain in effect (meaning this test can be used) for the duration of the COVID-19 declaration under Section 564(b)(1) of the Act, 21 U.S.C. section 360bbb-3(b)(1), unless the authorization is terminated or revoked sooner.  Performed at [facility], [address] [place], [place] [address]       Lab Results  Component Value Date   PTH 99 (H) 04/30/2016   CALCIUM 7.4 (L) 06/27/2020   CAION 1.18 09/06/2012   PHOS 6.1 (H) 06/25/2020               Impression:   [name] is a 86 y.o.  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 Patient had tunneled catheter placed yesterday-06/25/2020 Patient had her first hemodialysis session on June 25 2020 Patient will need renal replacement therapy today Patient had CKD stage IIIb Patient has CKD stage IIIb secondary 2015 Patient CKD was thought to be secondary to ANCA positive GN-biopsy-proven.  Pt had HD yesterday ( Saturday)   Plan is to have another renal biopsy on Monday  2)HTN Patient blood pressure on the higher side   will start amlodipine  3)Anemia of chronic disease  HGb is not at goal (9--11) Patient is on Epogen  4) secondary hyperparathyroidism -CKD  Mineral-Bone Disorder   Secondary Hyperparathyroidism  present Phosphorus is not at goal. Initiating renal placement therapy will help  5) MGUS Patient will benefit from hematology consult as an outpatient  6) electrolytes   sodium Normonatremic   potassium Normokalemic    7)Acid base Co2 at goal  8) renal biopsy It is scheduled for tomorrow We will ask for PT/PTT/INR We will put in orders for n.p.o. after midnight    Plan:  We will check phosphorus with tomorrow morning labs No need for Hd today      Mishawn Didion s [facility] 06/27/2020, 10:34 AM

## 2020-06-28 ENCOUNTER — Inpatient Hospital Stay: Payer: Medicare Other

## 2020-06-28 ENCOUNTER — Encounter: Payer: Self-pay | Admitting: Vascular Surgery

## 2020-06-28 LAB — BASIC METABOLIC PANEL
Anion gap: 11 (ref 5–15)
BUN: 67 mg/dL — ABNORMAL HIGH (ref 8–23)
CO2: 26 mmol/L (ref 22–32)
Calcium: 7.4 mg/dL — ABNORMAL LOW (ref 8.9–10.3)
Chloride: 100 mmol/L (ref 98–111)
Creatinine, Ser: 7.43 mg/dL — ABNORMAL HIGH (ref 0.44–1.00)
GFR calc Af Amer: 5 mL/min — ABNORMAL LOW (ref >60)
GFR calc non Af Amer: 5 mL/min — ABNORMAL LOW (ref >60)
Glucose, Bld: 117 mg/dL — ABNORMAL HIGH (ref 70–99)
Potassium: 4.4 mmol/L (ref 3.5–5.1)
Sodium: 137 mmol/L (ref 135–145)

## 2020-06-28 LAB — CBC
HCT: 24.9 % — ABNORMAL LOW (ref 36.0–46.0)
Hemoglobin: 8.3 g/dL — ABNORMAL LOW (ref 12.0–15.0)
MCH: 29.3 pg (ref 26.0–34.0)
MCHC: 33.3 g/dL (ref 30.0–36.0)
MCV: 88 fL (ref 80.0–100.0)
Platelets: 166 10*3/uL (ref 150–400)
RBC: 2.83 MIL/uL — ABNORMAL LOW (ref 3.87–5.11)
RDW: 15.5 % (ref 11.5–15.5)
WBC: 9.5 10*3/uL (ref 4.0–10.5)
nRBC: 0 % (ref 0.0–0.2)

## 2020-06-28 LAB — GLUCOSE, CAPILLARY
Glucose-Capillary: 117 mg/dL — ABNORMAL HIGH (ref 70–99)
Glucose-Capillary: 101 mg/dL — ABNORMAL HIGH (ref 70–99)
Glucose-Capillary: 103 mg/dL — ABNORMAL HIGH (ref 70–99)
Glucose-Capillary: 152 mg/dL — ABNORMAL HIGH (ref 70–99)

## 2020-06-28 LAB — PROTEIN ELECTRO, RANDOM URINE
Albumin ELP, Urine: 42.1 %
Alpha-1-Globulin, U: 4.4 %
Alpha-2-Globulin, U: 5.3 %
Beta Globulin, U: 26.4 %
Gamma Globulin, U: 21.8 %
M Component, Ur: 15.3 % — ABNORMAL HIGH
Total Protein, Urine: 436.6 mg/dL

## 2020-06-28 LAB — PARATHYROID HORMONE, INTACT (NO CA): PTH: 163 pg/mL — ABNORMAL HIGH (ref 15–65)

## 2020-06-28 LAB — PHOSPHORUS: Phosphorus: 6.8 mg/dL — ABNORMAL HIGH (ref 2.5–4.6)

## 2020-06-28 LAB — APTT: aPTT: 42 seconds — ABNORMAL HIGH (ref 24–36)

## 2020-06-28 MED ORDER — HYDRALAZINE HCL 20 MG/ML IJ SOLN
INTRAMUSCULAR | Status: AC
  Filled 2020-06-28: qty 1

## 2020-06-28 MED ORDER — MIDAZOLAM HCL 2 MG/2ML IJ SOLN
INTRAMUSCULAR | Status: AC
  Filled 2020-06-28: qty 4

## 2020-06-28 MED ORDER — SODIUM CHLORIDE 0.9 % IV SOLN
INTRAVENOUS | Status: AC | PRN
  Administered 2020-06-28: 10 mL/h via INTRAVENOUS

## 2020-06-28 MED ORDER — FENTANYL CITRATE (PF) 100 MCG/2ML IJ SOLN
INTRAMUSCULAR | Status: AC | PRN
  Administered 2020-06-28: 50 ug via INTRAVENOUS

## 2020-06-28 MED ORDER — FENTANYL CITRATE (PF) 100 MCG/2ML IJ SOLN
INTRAMUSCULAR | Status: AC
  Filled 2020-06-28: qty 4

## 2020-06-28 MED ORDER — MIDAZOLAM HCL 2 MG/2ML IJ SOLN
INTRAMUSCULAR | Status: AC | PRN
  Administered 2020-06-28: 1 mg via INTRAVENOUS

## 2020-06-28 MED ORDER — LABETALOL HCL 5 MG/ML IV SOLN
INTRAVENOUS | Status: AC
  Filled 2020-06-28: qty 4

## 2020-06-28 NOTE — Care Management Important Message (Signed)
Important Message  Patient Details  Name: ICHELLE HARRAL MRN: [id number] Date of Birth: 1934/06/14   Medicare Important Message Given:  Yes     [name] 06/28/2020, 11:18 AM

## 2020-06-28 NOTE — Progress Notes (Addendum)
PT Cancellation Note  Patient Details Name: [name] MRN: [id number] DOB: 1934-03-29   Cancelled Treatment:    Reason Eval/Treat Not Completed: Other (comment) (Pt being taken off floor for kidney biopsy. Will continue PT efforts as able.)   Herminio Commons, PT, DPT 8:58 AM,06/28/20

## 2020-06-28 NOTE — Progress Notes (Signed)
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 Cr is labile. S/p permacath 06/25/20. Continue on HD as per nephro. W/ hx of ANCA pauci-immune glomerulonephritis. Completed course of stress dose steroids.S/p kidney biopsy 06/28/20, pathology pending. Will need outpatient HD spot prior to d/c. Nephro following and recs apprec   ACD: likely secondary to CKD. Will transfuse if Hb <7.0.   Hx of ANCA vasculitis: not on any treatments outpatient as per med rec  Hyperkalemia: WNL today. Will continue to monitor   Thrombocytopenia: resolved   CAD: s/p CABG. Continue on home dose of metoprolol. Continue to hold aspirn, plavix   Hx of CVA: recent July 2021 w/ residual right arm weakness. Hold aspirin, plavix as per nephro   Carotid stenosis: recent IR procedure showed 30-40% left ICA stenosis, no acute intervention needed  DM2: continue to hold home dose of glimepiride. Will continue on SSI w/ accuchecks   Chronic pain: continue on norco, morphine prn   Anxiety: severity unknown. 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 Continue to hold home dose of lasix secondary CKD   Hypothyroidism: continue on home dose of synthroid      DVT prophylaxis: heparin SQ Code Status: full Family Communication: discussed pt's care w/ pt's husband again who is at bedside and answered his questions  Disposition Plan:  depends on PT/OT recs  Status is: Inpatient  Remains inpatient appropriate because:Ongoing diagnostic testing needed not appropriate for outpatient work up, IV treatments appropriate due to intensity of illness or inability to take PO and Inpatient level of care appropriate due to severity of illness s/p kidney biopsy today, still needs outpatient HD prior to d/c    Dispo: The patient is from: Home              Anticipated d/c is to: Home vs SNF              Anticipated d/c date is: > 3 days              Patient currently is not medically stable to d/c.    Consultants:   nephro   Vascular surg   Procedures:   Antimicrobials:    Subjective: Pt c/o malaise   Objective: Vitals:   06/27/20 0434 06/27/20 0826 06/27/20 1717 06/28/20 0116  BP: (!) 163/77 (!) 187/79 (!) 171/80 (!) 154/79  Pulse: 77 93 99 82  Resp: 17 16 20 20   Temp: (!) 97.5 F (36.4 C) 98.2 F (36.8 C) 98.4 F (36.9 C) 98.8 F (37.1 C)  TempSrc: Oral Oral Oral   SpO2: 94% 92% 91% 96%  Weight:      Height:       No intake or output data in the 24 hours ending 06/28/20 0754 Filed Weights   06/25/20 1400  Weight: 93.9 kg    Examination:  General exam: Appears uncomfortable  Respiratory  system: clear breath sounds b/l. No rales, rhonchi  Cardiovascular system:  S1/S2+. No gallops or rubs Gastrointestinal system: Abdomen is nondistended, soft and nontender. Normal bowel sounds  Central nervous system: Alert and oriented. Moves all 4 extremities  Psychiatry: Judgement and insight appear normal. Flat mood and affect     Data Reviewed: I have personally reviewed following labs and imaging studies  CBC: Recent Labs  Lab 06/24/20 0740 06/25/20 0435 06/26/20 0719 06/27/20 0316 06/28/20 0504  WBC 10.6* 9.7 10.8* 14.2* 9.5  HGB 8.7* 8.5* 8.5* 8.8* 8.3*  HCT 26.9* 27.5* 25.8* 27.5* 24.9*  MCV 89.1 94.2 88.4 90.2 88.0  PLT 199 110* 203 180 454   Basic Metabolic Panel: Recent Labs  Lab  06/24/20 0740 06/25/20 0435 06/26/20 0719 06/27/20 0316 06/28/20 0504  NA 132*  133* 137  137 138 139 137  K 5.5*  5.5* 4.8  4.8 4.7 4.6 4.4  CL 99  99 101  102 103 103 100  CO2 23  23 24  23 26 27 26   GLUCOSE 207*  209* 136*  136* 135* 137* 117*  BUN 70*  73* 84*  85* 69* 48* 67*  CREATININE 5.66*  5.72* 6.09*  6.15* 5.63* 5.27* 7.43*  CALCIUM 7.9*  7.9* 7.4*  7.4* 7.2* 7.4* 7.4*  PHOS 6.1* 6.1*  --   --  6.8*   GFR: Estimated Creatinine Clearance: 6.4 mL/min (A) (by C-G formula based on SCr of 7.43 mg/dL (H)). Liver Function Tests: Recent Labs  Lab 06/22/20 1412 06/24/20 0740 06/25/20 0435  AST 10*  --   --   ALT 6  --   --   ALKPHOS 78  --   --   BILITOT 0.7  --   --   PROT 7.9  --   --   ALBUMIN 3.1* 2.9* 2.9*   No results for input(s): LIPASE, AMYLASE in the last 168 hours. No results for input(s): AMMONIA in the last 168 hours. Coagulation Profile: Recent Labs  Lab 06/23/20 0444 06/27/20 1128  INR 1.0 1.0   Cardiac Enzymes: Recent Labs  Lab 06/23/20 1156  CKTOTAL 26*   BNP (last 3 results) No results for input(s): PROBNP in the last 8760 hours. HbA1C: No results for input(s): HGBA1C in the last 72 hours. CBG: Recent Labs  Lab 06/26/20 1659 06/27/20 0853 06/27/20 1158 06/27/20 1625 06/27/20 2143  GLUCAP 102* 123* 119* 116* 110*   Lipid Profile: No results for input(s): CHOL, HDL, LDLCALC, TRIG, CHOLHDL, LDLDIRECT in the last 72 hours. Thyroid Function Tests: No results for input(s): TSH, T4TOTAL, FREET4, T3FREE, THYROIDAB in the last 72 hours. Anemia Panel: Recent Labs    06/25/20 1430  FERRITIN 67  TIBC 283  IRON 36   Sepsis Labs: No results for input(s): PROCALCITON, LATICACIDVEN in the last 168 hours.  Recent Results (from the past 240 hour(s))  SARS Coronavirus 2 by RT PCR (hospital order, performed in [facility] hospital lab) Nasopharyngeal Nasopharyngeal Swab     Status: None   Collection Time: 06/22/20  4:33 PM    Specimen: Nasopharyngeal Swab  Result Value Ref Range Status   SARS Coronavirus 2 NEGATIVE NEGATIVE Final    Comment: (NOTE) SARS-CoV-2 target nucleic acids are NOT DETECTED.  The SARS-CoV-2 RNA is generally detectable in upper and lower respiratory specimens during the acute phase of infection. The lowest concentration of SARS-CoV-2 viral copies this assay can detect is 250 copies / mL. A negative result does not preclude SARS-CoV-2 infection  and should not be used as the sole basis for treatment or other patient management decisions.  A negative result may occur with improper specimen collection / handling, submission of specimen other than nasopharyngeal swab, presence of viral mutation(s) within the areas targeted by this assay, and inadequate number of viral copies (<250 copies / mL). A negative result must be combined with clinical observations, patient history, and epidemiological information.  Fact Sheet for Patients:   StrictlyIdeas.no  Fact Sheet for Healthcare Providers: BankingDealers.co.za  This test is not yet approved or  cleared by the [place] FDA and has been authorized for detection and/or diagnosis of SARS-CoV-2 by FDA under an Emergency Use Authorization (EUA).  This EUA will remain in effect (meaning this test can be used) for the duration of the COVID-19 declaration under Section 564(b)(1) of the Act, 21 U.S.C. section 360bbb-3(b)(1), unless the authorization is terminated or revoked sooner.  Performed at [facility], [address], [place], [place] [address]          Radiology Studies: No results found.      Scheduled Meds: . amLODipine  5 mg Oral Daily  . Chlorhexidine Gluconate Cloth  6 each Topical Q0600  . docusate sodium  200 mg Oral Daily  . [START ON 06/29/2020] epoetin (EPOGEN/PROCRIT) injection  4,000 Units Intravenous Q T,Th,Sa-HD  . insulin aspart  0-9 Units  Subcutaneous TID WC  . levothyroxine  150 mcg Oral QAC breakfast  . melatonin  5 mg Oral QHS  . metoprolol tartrate  50 mg Oral Q8H  . scopolamine  1 patch Transdermal Q72H  . sodium chloride flush  3 mL Intravenous Q12H   Continuous Infusions: . sodium chloride       LOS: 6 days    Time spent: 32 mins     Wyvonnia Dusky, MD [facility] Pager [phone]  If 7PM-7AM, please contact night-coverage [email or web address] 06/28/2020, 7:54 AM

## 2020-06-28 NOTE — Progress Notes (Signed)
OT Cancellation Note  Patient Details Name: ASSATA JUNCAJ MRN: [id number] DOB: 1934-07-06   Cancelled Treatment:    Reason Eval/Treat Not Completed: Patient at procedure or test/ unavailable  OT consult received and chart reviewed. Pt off floor for Kidney Bx at this time. Will f/u as able for OT evaluation. Thank you.  Gerrianne Scale, [place], OTR/L ascom [phone] 06/28/20, 9:12 AM

## 2020-06-28 NOTE — Progress Notes (Signed)
[place], [place] 06/28/20  Subjective:   Hospital day # 6   Renal: No intake/output data recorded. Lab Results  Component Value Date   CREATININE 7.43 (H) 06/28/2020   CREATININE 5.27 (H) 06/27/2020   CREATININE 5.63 (H) 06/26/2020   Patient reports dry heaving off and on Underwent renal biopsy today. Tolerated well Was able to eat lunch today without GI symptoms No significant leg edema   Objective:  Vital signs in last 24 hours:  Temp:  [97.5 F (36.4 C)-98.8 F (37.1 C)] 98.1 F (36.7 C) (09/27 1230) Pulse Rate:  [73-99] 85 (09/27 1230) Resp:  [15-20] 18 (09/27 1230) BP: (148-181)/(56-80) 150/58 (09/27 1230) SpO2:  [90 %-100 %] 93 % (09/27 1230)  Weight change:  Filed Weights   06/25/20 1400  Weight: 93.9 kg    Intake/Output:    Intake/Output Summary (Last 24 hours) at 06/28/2020 1551 Last data filed at 06/28/2020 1500 Gross per 24 hour  Intake 53.67 ml  Output --  Net 53.67 ml   Physical Exam: General:  No acute distress, laying in the bed  HEENT  anicteric, moist oral mucous membrane  Pulm/lungs  normal breathing effort, lungs are clear to auscultation  CVS/Heart  regular rhythm, no rub or gallop  Abdomen:   Soft, nontender  Extremities:  No peripheral edema  Neurologic:  Alert, oriented, able to follow commands  Skin:  No acute rashes  Right IJ PermCath    Basic Metabolic Panel:  Recent Labs  Lab 06/24/20 0740 06/24/20 0740 06/25/20 0435 06/25/20 0435 06/26/20 0719 06/27/20 0316 06/28/20 0504  NA 132*  133*  --  137  137  --  138 139 137  K 5.5*  5.5*  --  4.8  4.8  --  4.7 4.6 4.4  CL 99  99  --  101  102  --  103 103 100  CO2 23  23  --  24  23  --  26 27 26   GLUCOSE 207*  209*  --  136*  136*  --  135* 137* 117*  BUN 70*  73*  --  84*  85*  --  69* 48* 67*  CREATININE 5.66*  5.72*  --  6.09*  6.15*  --  5.63* 5.27* 7.43*  CALCIUM 7.9*  7.9*   < > 7.4*  7.4*   < > 7.2* 7.4* 7.4*   PHOS 6.1*  --  6.1*  --   --   --  6.8*   < > = values in this interval not displayed.     CBC: Recent Labs  Lab 06/24/20 0740 06/25/20 0435 06/26/20 0719 06/27/20 0316 06/28/20 0504  WBC 10.6* 9.7 10.8* 14.2* 9.5  HGB 8.7* 8.5* 8.5* 8.8* 8.3*  HCT 26.9* 27.5* 25.8* 27.5* 24.9*  MCV 89.1 94.2 88.4 90.2 88.0  PLT 199 110* 203 180 166      Lab Results  Component Value Date   HEPBSAG NON REACTIVE 06/23/2020   HEPBSAB NON REACTIVE 06/23/2020   HEPBIGM NON REACTIVE 06/23/2020      Microbiology:  Recent Results (from the past 240 hour(s))  SARS Coronavirus 2 by RT PCR (hospital order, performed in [facility] hospital lab) Nasopharyngeal Nasopharyngeal Swab     Status: None   Collection Time: 06/22/20  4:33 PM   Specimen: Nasopharyngeal Swab  Result Value Ref Range Status   SARS Coronavirus 2 NEGATIVE NEGATIVE Final    Comment: (NOTE) SARS-CoV-2 target nucleic acids are NOT  DETECTED.  The SARS-CoV-2 RNA is generally detectable in upper and lower respiratory specimens during the acute phase of infection. The lowest concentration of SARS-CoV-2 viral copies this assay can detect is 250 copies / mL. A negative result does not preclude SARS-CoV-2 infection and should not be used as the sole basis for treatment or other patient management decisions.  A negative result may occur with improper specimen collection / handling, submission of specimen other than nasopharyngeal swab, presence of viral mutation(s) within the areas targeted by this assay, and inadequate number of viral copies (<250 copies / mL). A negative result must be combined with clinical observations, patient history, and epidemiological information.  Fact Sheet for Patients:   StrictlyIdeas.no  Fact Sheet for Healthcare Providers: BankingDealers.co.za  This test is not yet approved or  cleared by the [place] FDA and has been authorized for detection  and/or diagnosis of SARS-CoV-2 by FDA under an Emergency Use Authorization (EUA).  This EUA will remain in effect (meaning this test can be used) for the duration of the COVID-19 declaration under Section 564(b)(1) of the Act, 21 U.S.C. section 360bbb-3(b)(1), unless the authorization is terminated or revoked sooner.  Performed at [facility], [address] [place], [place] [address]     Coagulation Studies: Recent Labs    06/27/20 1128  LABPROT 12.8  INR 1.0    Urinalysis: No results for input(s): COLORURINE, LABSPEC, PHURINE, GLUCOSEU, HGBUR, BILIRUBINUR, KETONESUR, PROTEINUR, UROBILINOGEN, NITRITE, LEUKOCYTESUR in the last 72 hours.  Invalid input(s): APPERANCEUR    Imaging: US BIOPSY (KIDNEY)  Result Date: 06/28/2020 INDICATION: Chronic kidney disease, acute kidney injury, hematuria and need for renal biopsy for further workup. EXAM: ULTRASOUND GUIDED CORE BIOPSY OF RIGHT KIDNEY MEDICATIONS: None. ANESTHESIA/SEDATION: Fentanyl 50 mcg IV; Versed 1.0 mg IV Moderate Sedation Time:  21 minutes. The patient was continuously monitored during the procedure by the interventional radiology nurse under my direct supervision. PROCEDURE: The procedure, risks, benefits, and alternatives were explained to the patient. Questions regarding the procedure were encouraged and answered. The patient understands and consents to the procedure. A time-out was performed prior to initiating the procedure. Both kidneys were examined in a prone position. The right was chosen for biopsy. The right flank region was prepped with chlorhexidine in a sterile fashion, and a sterile drape was applied covering the operative field. A sterile gown and sterile gloves were used for the procedure. Local anesthesia was provided with 1% Lidocaine. A 15 gauge trocar needle was advanced to the level of lower pole cortex of the right kidney. Under ultrasound guidance, 2 separate 16 gauge core biopsy samples were  obtained. Core biopsy samples were submitted on saline soaked Telfa gauze. A slurry of Gel-Foam pledgets mixed in normal saline was then injected through the 15 gauge needle as the needle was retracted and removed. Additional ultrasound was performed. COMPLICATIONS: None immediate. FINDINGS: Both kidneys are visible by ultrasound. The right kidney was less deep in location and better visualized with slightly thicker renal cortex compared to the left. Solid core biopsy samples were obtained. IMPRESSION: Ultrasound-guided core biopsy performed at the level of right lower pole renal cortex. Electronically Signed   By: Aletta Edouard M.D.   On: 06/28/2020 10:42     Medications:   . sodium chloride 250 mL (06/28/20 0938)   . amLODipine  5 mg Oral Daily  . Chlorhexidine Gluconate Cloth  6 each Topical Q0600  . docusate sodium  200 mg Oral Daily  . [START ON  06/29/2020] epoetin (EPOGEN/PROCRIT) injection  4,000 Units Intravenous Q T,Th,Sa-HD  . fentaNYL      . hydrALAZINE      . insulin aspart  0-9 Units Subcutaneous TID WC  . labetalol      . levothyroxine  150 mcg Oral QAC breakfast  . melatonin  5 mg Oral QHS  . metoprolol tartrate  50 mg Oral Q8H  . midazolam      . scopolamine  1 patch Transdermal Q72H  . sodium chloride flush  3 mL Intravenous Q12H   sodium chloride, ALPRAZolam, HYDROcodone-acetaminophen, morphine injection, ondansetron (ZOFRAN) IV, ondansetron, polyvinyl alcohol, sodium chloride, sodium chloride flush  Assessment/ Plan:  87 y.o. female with history of ANCA vasculitis, coronary disease status post CABG, type 2 diabetes, hypertension, hyperlipidemia, GERD, hypothyroidism, stroke, macular degeneration, diabetic neuropathy, history of breast cancer, carotid stenosis history of kidney biopsy August 2017 admitted on 06/22/2020 for Hyperkalemia [E87.5] AKI (acute kidney injury) ([place]) [N17.9] Anemia, unspecified type [D64.9]   #AKI Patient has history of pauci-immune GN  diagnosed in August 2017. Thought to be drug-induced secondary to hydralazine. Treated with high-dose steroids but then lost to follow-up Currently requiring dialysis. First session June 25, 2020 Review of outpatient records show that creatinine was baseline in August 2019 at 1.35, GFR 36 Creatinine trend has been increasing since April 23, 2020. Creatinine noted at 3.40, and has been worsening since then  plan for dialysis tomorrow Discharge planning for outpatient dialysis  Repeat biopsy done today (September 27) results awaited. Stay off hydralazine  Serologies from September 22 show ANA positive,p-ANCA positive, titer 1: 640, low C3, elevated kappa to lambda ratio of 34.6  Plan: Hemodialysis possibly tomorrow Will need hematology evaluation for elevated kappa lambda ratio       LOS: [place] 9/27/20213:51 PM  [place], [place]  Note: This note was prepared with Dragon dictation. Any transcription errors are unintentional

## 2020-06-28 NOTE — Procedures (Signed)
Interventional Radiology Procedure Note  Procedure: US Guided Biopsy of right kidney  Complications: None  Estimated Blood Loss: < 10 mL  Findings: 16 G core biopsy of right kidney performed under US guidance.  Two core samples obtained and sent to Pathology.  Delcie Ruppert T. Adonus Uselman, M.D Pager:  [phone]    

## 2020-06-28 NOTE — Consult Note (Signed)
Chief Complaint: Acute on chronic AKI. Request is for kidney biopsy  Referring Physician(s): Dr. Fatima Sanger  Supervising Physician: Aletta Edouard  Patient Status: [facility] - In-pt  History of Present Illness: [name] is a 86 y.o. female History of DM, HTN, CVA, ANCA vasculitis, CAD s.p CABG  X 4 in 2013 and carotid stent (repaired on 9.13.21), CKD stage 3. Admitted to the hospital with hyperkalemia found to have a cute on chronic AKI  with hematuria per Team likely to be glomerulonephritis related.  US renal from 9.9.21 reads Mildly increased echogenicity within the renal parenchyma, suggesting medical renal disease. Team is requesting kidney biopsy for further evaluation of acute on chronic AKI.   Past Medical History:  Diagnosis Date  . Angina pectoris ([place]) 08/26/2012  . Arthritis   . Breast cancer ([place]) 2012   right breast  . Breast mass, right   . Cancer [facility])    breast cancer, right side 2012  . Chronic kidney disease    RENAL INSUFF  . Complication of anesthesia   . Coronary artery disease 08/22/2012   sees Dr Rockey Situ  . Diabetes ([place])   . Dyspnea    ON EXERTION  . Gastritis    hx of  . GERD (gastroesophageal reflux disease)   . Headache(784.0)    migraines  . History of breast cancer    39 treatments of radiation. Negative chemo.  Marland Kitchen History of seasonal allergies   . Hyperlipemia   . Hypertension    sees Dr. Fulton Reek  . Hypothyroidism   . Macular degeneration    Bilateral  . Macular degeneration 2018  . Neuropathy   . Personal history of radiation therapy   . Pneumonia    hx of  . PONV (postoperative nausea and vomiting)   . 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 Stroke ([place])    2012  . Thyroid disease   . Vertigo     Past Surgical History:  Procedure Laterality Date  . ABDOMINAL HYSTERECTOMY    . APPENDECTOMY    . BACK SURGERY    . BREAST BIOPSY Right    2012  positive- [facility]  . BREAST LUMPECTOMY Right 2012   F/U radiation   . BREAST SURGERY    . CARDIAC CATHETERIZATION  2014  . CAROTID PTA/STENT INTERVENTION Left 06/14/2020   Procedure: CAROTID PTA/STENT INTERVENTION;  Surgeon: Algernon Huxley, MD;  Location: [place] [facility];  Service: Cardiovascular;  Laterality: Left;  . CATARACT EXTRACTION W/PHACO Right 08/14/2017   Procedure: CATARACT EXTRACTION PHACO AND INTRAOCULAR LENS PLACEMENT (IOC);  Surgeon: Birder Robson, MD;  Location: [facility];  Service: Ophthalmology;  Laterality: Right;  [place] 00:43.0 AP% 17.1 CDE 7.37 Fluid Pack lot # [id number]  . CATARACT EXTRACTION W/PHACO Left 10/09/2017   Procedure: CATARACT EXTRACTION PHACO AND INTRAOCULAR LENS PLACEMENT ([place]);  Surgeon: Birder Robson, MD;  Location: [facility];  Service: Ophthalmology;  Laterality: Left;  [place] 00:30.3 AP% 11.0 CDE 3.33 Fluid Pack Lot # [id number]  . CORONARY ARTERY BYPASS GRAFT  09/05/2012   Procedure: CORONARY ARTERY BYPASS GRAFTING (CABG);  Surgeon: Rexene Alberts, MD;  Location: [facility];  Service: Open Heart Surgery;  Laterality: N/A;  CABG x four, using left internal mammary artery and bilateral greater saphenous vein harvested endoscopically  . DIALYSIS/PERMA CATHETER INSERTION Left 06/25/2020   Procedure: DIALYSIS/PERMA CATHETER INSERTION;  Surgeon: Lucky Cowboy,  Erskine Squibb, MD;  Location: [place] [facility];  Service: Cardiovascular;  Laterality: Left;  . INTRAOPERATIVE TRANSESOPHAGEAL ECHOCARDIOGRAM  09/05/2012   Procedure: INTRAOPERATIVE TRANSESOPHAGEAL ECHOCARDIOGRAM;  Surgeon: Rexene Alberts, MD;  Location: [facility];  Service: Open Heart Surgery;  Laterality: N/A;  . KNEE SURGERY     Bilateral nerve block  . LAPAROSCOPIC NISSEN FUNDOPLICATION    . OVARIAN CYST REMOVAL      Allergies: Clonidine, Darvocet [propoxyphene n-acetaminophen], Duratuss g [guaifenesin], Ketek  [telithromycin], Ciprocinonide [fluocinolone], Ivp dye [iodinated diagnostic agents], Levaquin [levofloxacin],  and Statins  Medications: Prior to Admission medications   Medication Sig Start Date End Date Taking? Authorizing Provider  ALPRAZolam Duanne Moron) 0.5 MG tablet Take 0.5 mg by mouth 2 (two) times daily as needed for anxiety.    Yes [provider]  aspirin 81 MG chewable tablet Chew 81 mg by mouth daily.   Yes [provider]  calcium-vitamin D (OSCAL WITH D) 500-200 MG-UNIT tablet Take 1 tablet by mouth daily.   Yes [provider]  cetirizine (ZYRTEC) 10 MG tablet Take 10 mg by mouth daily.   Yes [provider]  clopidogrel (PLAVIX) 75 MG tablet Take 75 mg by mouth daily.    Yes [provider]  Cyanocobalamin 1000 MCG SUBL Take 1,000 mcg by mouth daily.    Yes [provider]  gabapentin (NEURONTIN) 100 MG capsule Take 100 mg by mouth 2 (two) times daily. 09/23/12  Yes Love, Ivan Anchors, PA-C  glimepiride (AMARYL) 4 MG tablet Take 4 mg by mouth daily with breakfast.    Yes [provider]  hydrALAZINE (APRESOLINE) 25 MG tablet Take 25 mg 3 (three) times daily by mouth.   Yes [provider]  HYDROcodone-acetaminophen (NORCO/VICODIN) 5-325 MG tablet Take 1 tablet by mouth every 6 (six) hours as needed for moderate pain.   Yes [provider]  levothyroxine (SYNTHROID, LEVOTHROID) 150 MCG tablet Take 150 mcg daily before breakfast by mouth.   Yes [provider]  metoprolol tartrate (LOPRESSOR) 50 MG tablet Take 50 mg by mouth in the morning, at noon, and at bedtime.   Yes [provider]     Family History  Problem Relation Age of Onset  . Heart disease Brother        CABG & stents  . Hyperlipidemia Brother   . Hypertension Brother   . Cancer Father   . Heart disease Mother   . Breast cancer Cousin   . Kidney disease Neg Hx     Social History   Socioeconomic History  . Marital status: Married    Spouse name: [name] "Bud"  . Number of children: 0  . Years of education: Not on file  .  Highest education level: Not on file  Occupational History  . Occupation: retired  Tobacco Use  . Smoking status: Former Smoker    Types: Cigarettes    Quit date: 11/13/1988    Years since quitting: 31.6  . Smokeless tobacco: Never Used  Substance and Sexual Activity  . Alcohol use: No  . Drug use: No  . Sexual activity: Not on file  Other Topics Concern  . Not on file  Social History Narrative   Lives at home with spouse    Social Determinants of Health   Financial Resource Strain:   . Difficulty of Paying Living Expenses: Not on file  Food Insecurity:   . Worried About Charity fundraiser in the Last Year: Not on file  .  Ran Out of Food in the Last Year: Not on file  Transportation Needs:   . Lack of Transportation (Medical): Not on file  . Lack of Transportation (Non-Medical): Not on file  Physical Activity:   . Days of Exercise per Week: Not on file  . Minutes of Exercise per Session: Not on file  Stress:   . Feeling of Stress : Not on file  Social Connections:   . Frequency of Communication with Friends and Family: Not on file  . Frequency of Social Gatherings with Friends and Family: Not on file  . Attends Religious Services: Not on file  . Active Member of Clubs or Organizations: Not on file  . Attends Archivist Meetings: Not on file  . Marital Status: Not on file    Review of Systems: A 12 point ROS discussed and pertinent positives are indicated in the HPI above.  All other systems are negative.  Review of Systems  Constitutional: Negative for fatigue and fever.  HENT: Negative for congestion.   Respiratory: Negative for cough and shortness of breath.   Gastrointestinal: Positive for nausea ( intermittent not currently.). Negative for abdominal pain, diarrhea and vomiting.  Musculoskeletal: Positive for back pain.    Vital Signs: BP (!) 151/57 (BP Location: Left Arm)   Pulse 73   Temp (!) 97.5 F (36.4 C) (Oral)   Resp 17   Ht 5\' 4"  (1.626  m)   Wt 207 lb 0.2 oz (93.9 kg)   SpO2 95%   BMI 35.53 kg/m   Physical Exam Vitals and nursing note reviewed.  Constitutional:      Appearance: She is well-developed.  HENT:     Head: Normocephalic and atraumatic.  Eyes:     Conjunctiva/sclera: Conjunctivae normal.  Cardiovascular:     Rate and Rhythm: Normal rate and regular rhythm.     Heart sounds: Normal heart sounds.     Comments:  Sternal surgical incision.  Pulmonary:     Effort: Pulmonary effort is normal.  Musculoskeletal:        General: Normal range of motion.     Cervical back: Normal range of motion.  Skin:    General: Skin is warm.  Neurological:     Mental Status: She is alert and oriented to person, place, and time.     Imaging: DG Chest 2 View  Result Date: 06/23/2020 CLINICAL DATA:  CHF, acute kidney failure EXAM: CHEST - 2 VIEW COMPARISON:  May 04, 2016 FINDINGS: Post median sternotomy for CABG.  Stable cardiac enlargement. Mild interstitial prominence bilaterally. No lobar consolidation. Patchy opacity with linear characteristics in the LEFT lung base. No acute skeletal process on limited assessment. IMPRESSION: 1. Cardiomegaly with mild interstitial prominence which may reflect mild edema. No lobar consolidation. 2. Areas of platelike in linear opacity at the LEFT lung base likely atelectasis. Electronically Signed   By: Zetta Bills M.D.   On: 06/23/2020 11:42   US RENAL  Result Date: 06/10/2020 CLINICAL DATA:  Initial evaluation for acute renal failure. EXAM: RENAL / URINARY TRACT ULTRASOUND COMPLETE COMPARISON:  Prior ultrasound from 04/30/2016. FINDINGS: Right Kidney: Renal measurements: 11.1 x 4.7 x 4.9 cm = volume: 134.8 mL. Increased echogenicity seen within the renal parenchyma. No nephrolithiasis or hydronephrosis. No focal renal mass. Left Kidney: Renal measurements: 11.5 x 5.0 x 5.1 cm = volume: 152.3 mL. Mildly increased echogenicity within the renal parenchyma. No nephrolithiasis or  hydronephrosis. No focal renal mass. Bladder: Appears normal for degree of  bladder distention. Other: 1.7 cm splenule noted. IMPRESSION: 1. Mildly increased echogenicity within the renal parenchyma, suggesting medical renal disease. 2. No hydronephrosis or other acute abnormality. Electronically Signed   By: Jeannine Boga M.D.   On: 06/10/2020 21:34   PERIPHERAL VASCULAR CATHETERIZATION  Result Date: 06/25/2020 See op note  PERIPHERAL VASCULAR CATHETERIZATION  Result Date: 06/14/2020 See op note  ECHOCARDIOGRAM COMPLETE  Result Date: 06/24/2020    ECHOCARDIOGRAM REPORT   Patient Name:   [name] Date of Exam: 06/23/2020 Medical Rec #:  [id number]         Height:       64.0 in Accession #:    [id number]        Weight:       207.0 lb Date of Birth:  16-Sep-1934         BSA:          1.985 m Patient Age:    41 years          BP:           178/94 mmHg Patient Gender: F                 HR:           87 bpm. Exam Location:  [facility] Procedure: 2D Echo, Cardiac Doppler and Color Doppler Indications:     N05.39 Acute Diastolic CHF  History:         Patient has prior history of Echocardiogram examinations, most                  recent 07/17/2015. Prior CABG; Risk Factors:Hypertension,                  Dyslipidemia and Diabetes. Thyroid disease. Pneumonia. History                  of breast cancer. Dyspnea. Coronary artery disease. Chronic                  kidney disease.  Sonographer:     Wilford Sports Rodgers-Jones Referring Phys:  [id number] [place] Diagnosing Phys: Kate Sable MD IMPRESSIONS  1. Left ventricular ejection fraction, by estimation, is 60 to 65%. The left ventricle has normal function. The left ventricle has no regional wall motion abnormalities. Left ventricular diastolic parameters were normal.  2. Right ventricular systolic function is normal. The right ventricular size is normal. There is normal pulmonary artery systolic pressure.  3. The mitral valve is normal in structure. No  evidence of mitral valve regurgitation. No evidence of mitral stenosis.  4. The aortic valve was not well visualized. Aortic valve regurgitation is not visualized.  5. The inferior vena cava is normal in size with greater than 50% respiratory variability, suggesting right atrial pressure of 3 mmHg. FINDINGS  Left Ventricle: Left ventricular ejection fraction, by estimation, is 60 to 65%. The left ventricle has normal function. The left ventricle has no regional wall motion abnormalities. The left ventricular internal cavity size was normal in size. There is  no left ventricular hypertrophy. Left ventricular diastolic parameters were normal. Right Ventricle: The right ventricular size is normal. No increase in right ventricular wall thickness. Right ventricular systolic function is normal. There is normal pulmonary artery systolic pressure. The tricuspid regurgitant velocity is 2.74 m/s, and  with an assumed right atrial pressure of 3 mmHg, the estimated right ventricular systolic pressure is 93.7 mmHg. Left Atrium: Left atrial size was normal in size. Right Atrium:  Right atrial size was normal in size. Pericardium: There is no evidence of pericardial effusion. Mitral Valve: The mitral valve is normal in structure. No evidence of mitral valve regurgitation. No evidence of mitral valve stenosis. Tricuspid Valve: The tricuspid valve is normal in structure. Tricuspid valve regurgitation is not demonstrated. No evidence of tricuspid stenosis. Aortic Valve: The aortic valve was not well visualized. Aortic valve regurgitation is not visualized. Pulmonic Valve: The pulmonic valve was normal in structure. Pulmonic valve regurgitation is not visualized. No evidence of pulmonic stenosis. Aorta: The aortic root is normal in size and structure. Venous: The inferior vena cava is normal in size with greater than 50% respiratory variability, suggesting right atrial pressure of 3 mmHg. IAS/Shunts: No atrial level shunt detected by  color flow Doppler.  LEFT VENTRICLE PLAX 2D LVIDd:         5.03 cm  Diastology LVIDs:         2.94 cm  LV e' medial:    8.38 cm/s LV PW:         0.99 cm  LV E/e' medial:  11.6 LV IVS:        0.91 cm  LV e' lateral:   9.36 cm/s LVOT diam:     2.00 cm  LV E/e' lateral: 10.4 LV SV:         61 LV SV Index:   31 LVOT Area:     3.14 cm  RIGHT VENTRICLE             IVC RV Basal diam:  3.51 cm     IVC diam: 1.51 cm RV S prime:     15.00 cm/s TAPSE (M-mode): 2.1 cm LEFT ATRIUM             Index       RIGHT ATRIUM           Index LA diam:        4.30 cm 2.17 cm/m  RA Area:     15.90 cm LA Vol (A2C):   57.4 ml 28.91 ml/m RA Volume:   48.10 ml  24.23 ml/m LA Vol (A4C):   58.5 ml 29.47 ml/m LA Biplane Vol: 59.5 ml 29.97 ml/m  AORTIC VALVE LVOT Vmax:   79.30 cm/s LVOT Vmean:  56.600 cm/s LVOT VTI:    0.195 m  AORTA Ao Root diam: 2.60 cm Ao Asc diam:  3.20 cm MITRAL VALVE                TRICUSPID VALVE MV Area (PHT): 3.42 cm     TR Peak grad:   30.0 mmHg MV Decel Time: 222 msec     TR Vmax:        274.00 cm/s MV E velocity: 97.30 cm/s MV A velocity: 117.00 cm/s  SHUNTS MV E/A ratio:  0.83         Systemic VTI:  0.20 m                             Systemic Diam: 2.00 cm Kate Sable MD Electronically signed by Kate Sable MD Signature Date/Time: 06/24/2020/12:36:36 PM    Final     Labs:  CBC: Recent Labs    06/25/20 0435 06/26/20 0719 06/27/20 0316 06/28/20 0504  WBC 9.7 10.8* 14.2* 9.5  HGB 8.5* 8.5* 8.8* 8.3*  HCT 27.5* 25.8* 27.5* 24.9*  PLT 110* 203 180 166    COAGS: Recent Labs  04/23/20 1438 06/23/20 0444 06/27/20 1128 06/28/20 0504  INR 0.9 1.0 1.0  --   APTT 36  --   --  42*    BMP: Recent Labs    06/25/20 0435 06/26/20 0719 06/27/20 0316 06/28/20 0504  NA 137  137 138 139 137  K 4.8  4.8 4.7 4.6 4.4  CL 101  102 103 103 100  CO2 24  23 26 27 26   GLUCOSE 136*  136* 135* 137* 117*  BUN 84*  85* 69* 48* 67*  CALCIUM 7.4*  7.4* 7.2* 7.4* 7.4*  CREATININE  6.09*  6.15* 5.63* 5.27* 7.43*  GFRNONAA 6*  6* 6* 7* 5*  GFRAA 7*  7* 7* 8* 5*    LIVER FUNCTION TESTS: Recent Labs    04/23/20 1438 06/22/20 1412 06/24/20 0740 06/25/20 0435  BILITOT 0.8 0.7  --   --   AST 11* 10*  --   --   ALT 8 6  --   --   ALKPHOS 81 78  --   --   PROT 8.5* 7.9  --   --   ALBUMIN 3.3* 3.1* 2.9* 2.9*    Assessment and Plan:  85.y.o. female inpatient. History of DM, HTN, CVA, ANCA vasculitis, CAD s.p CABG  X 4 in 2013 and carotid stent (repaired on 9.13.21), CKD stage 3. Admitted to the hospital with hyperkalemia found to have a cute on chronic AKI  with hematuria per Team likely to be glomerulonephritis related.  US renal from 9.9.21 reads Mildly increased echogenicity within the renal parenchyma, suggesting medical renal disease. Team is requesting kidney biopsy for further evaluation of acute on chronic AKI.   BUN 67, Cr 7.43, PTT 42. All other labs are within acceptable parameters. Patient is on plavix  currently held X 5 days. Allergies include IVP dye reaction nausea and vomiting and levaquin. Patient has been NPO since midnight.   Risks and benefits of kidney biopsy was discussed with the patient and/or patient's family including, but not limited to bleeding, infection, damage to adjacent structures or low yield requiring additional tests.  All of the questions were answered and there is agreement to proceed.  Consent signed and in chart.    Thank you for this interesting consult.  I greatly enjoyed meeting [name] and look forward to participating in their care.  A copy of this report was sent to the requesting provider on this date.  Electronically Signed: Jacqualine Mau, NP 06/28/2020, 8:15 AM   I spent a total of 40 Minutes    in face to face in clinical consultation, greater than 50% of which was counseling/coordinating care for kidney biopsy

## 2020-06-29 LAB — CBC
HCT: 22.7 % — ABNORMAL LOW (ref 36.0–46.0)
Hemoglobin: 7.6 g/dL — ABNORMAL LOW (ref 12.0–15.0)
MCH: 29.5 pg (ref 26.0–34.0)
MCHC: 33.5 g/dL (ref 30.0–36.0)
MCV: 88 fL (ref 80.0–100.0)
Platelets: 190 10*3/uL (ref 150–400)
RBC: 2.58 MIL/uL — ABNORMAL LOW (ref 3.87–5.11)
RDW: 15.3 % (ref 11.5–15.5)
WBC: 8.3 10*3/uL (ref 4.0–10.5)
nRBC: 0 % (ref 0.0–0.2)

## 2020-06-29 LAB — GLUCOSE, CAPILLARY
Glucose-Capillary: 111 mg/dL — ABNORMAL HIGH (ref 70–99)
Glucose-Capillary: 91 mg/dL (ref 70–99)
Glucose-Capillary: 105 mg/dL — ABNORMAL HIGH (ref 70–99)
Glucose-Capillary: 100 mg/dL — ABNORMAL HIGH (ref 70–99)

## 2020-06-29 LAB — BASIC METABOLIC PANEL
Anion gap: 12 (ref 5–15)
BUN: 88 mg/dL — ABNORMAL HIGH (ref 8–23)
CO2: 23 mmol/L (ref 22–32)
Calcium: 7.2 mg/dL — ABNORMAL LOW (ref 8.9–10.3)
Chloride: 98 mmol/L (ref 98–111)
Creatinine, Ser: 8.94 mg/dL — ABNORMAL HIGH (ref 0.44–1.00)
GFR calc Af Amer: 4 mL/min — ABNORMAL LOW (ref >60)
GFR calc non Af Amer: 4 mL/min — ABNORMAL LOW (ref >60)
Glucose, Bld: 136 mg/dL — ABNORMAL HIGH (ref 70–99)
Potassium: 4.9 mmol/L (ref 3.5–5.1)
Sodium: 133 mmol/L — ABNORMAL LOW (ref 135–145)

## 2020-06-29 NOTE — Progress Notes (Signed)
PT Cancellation Note  Patient Details Name: ELLIETT GUARISCO MRN: [id number] DOB: 1934-01-02   Cancelled Treatment:    Reason Eval/Treat Not Completed: Other (comment) (Pt off floor for dialysis. Attempted therapy evaluation earlier this AM, but pt was eating. Will continue PT evaluation efforts today as able.)  Herminio Commons, PT, DPT 9:37 AM,06/29/20

## 2020-06-29 NOTE — Progress Notes (Signed)
OT Cancellation Note  Patient Details Name: [name] MRN: [id number] DOB: 1934-09-26   Cancelled Treatment:    Reason Eval/Treat Not Completed: Patient at procedure or test/ unavailable  Pt OTF for HD at this time. Will f/u for OT evaluation at later date/time as able/as pt available. Thank you.  Gerrianne Scale, [place], OTR/L ascom [phone] 06/29/20, 11:28 AM

## 2020-06-29 NOTE — Plan of Care (Signed)

## 2020-06-29 NOTE — Progress Notes (Signed)
[place], [place] 06/29/20  Subjective:   Hospital day # 7.  Renal: 09/27 0701 - 09/28 0700 In: 53.7 [I.V.:53.7] Out: 850 [Urine:850] Lab Results  Component Value Date   CREATININE 8.94 (H) 06/29/2020   CREATININE 7.43 (H) 06/28/2020   CREATININE 5.27 (H) 06/27/2020   Patient seen in dialysis today, tolerating treatment well. Patient denies worsening SOB, but still has c/o anorexia, and intermittent nausea.  Objective:  Vital signs in last 24 hours:  Temp:  [97.9 F (36.6 C)-99.1 F (37.3 C)] 99.1 F (37.3 C) (09/28 0942) Pulse Rate:  [76-97] 87 (09/28 1245) Resp:  [9-24] 19 (09/28 1245) BP: (113-155)/(48-81) 116/69 (09/28 1230) SpO2:  [91 %-98 %] 94 % (09/28 1245)  Weight change:  Filed Weights   06/25/20 1400  Weight: 93.9 kg    Intake/Output:    Intake/Output Summary (Last 24 hours) at 06/29/2020 1351 Last data filed at 06/28/2020 2143 Gross per 24 hour  Intake 53.67 ml  Output 850 ml  Net -796.33 ml   Physical Exam: General:  No acute distress,receiving HD treatment  HEENT  anicteric, moist oral mucous membrane  Pulm/lungs  normal breathing effort, lungs are clear to auscultation  CVS/Heart  regular rhythm, no rub or gallop  Abdomen:   Soft, nontender  Extremities:  No peripheral edema  Neurologic:  Alert, oriented, able to follow commands  Skin:  No acute lesions or rashesrashes  Right IJ PermCath    Basic Metabolic Panel:  Recent Labs  Lab 06/24/20 0740 06/24/20 0740 06/25/20 0435 06/25/20 0435 06/26/20 0719 06/26/20 0719 06/27/20 0316 06/28/20 0504 06/29/20 0416  NA 132*  133*   < > 137  137  --  138  --  139 137 133*  K 5.5*  5.5*   < > 4.8  4.8  --  4.7  --  4.6 4.4 4.9  CL 99  99   < > 101  102  --  103  --  103 100 98  CO2 23  23   < > 24  23  --  26  --  27 26 23   GLUCOSE 207*  209*   < > 136*  136*  --  135*  --  137* 117* 136*  BUN 70*  73*   < > 84*  85*  --  69*  --  48* 67* 88*   CREATININE 5.66*  5.72*   < > 6.09*  6.15*  --  5.63*  --  5.27* 7.43* 8.94*  CALCIUM 7.9*  7.9*   < > 7.4*  7.4*   < > 7.2*   < > 7.4* 7.4* 7.2*  PHOS 6.1*  --  6.1*  --   --   --   --  6.8*  --    < > = values in this interval not displayed.     CBC: Recent Labs  Lab 06/25/20 0435 06/26/20 0719 06/27/20 0316 06/28/20 0504 06/29/20 0416  WBC 9.7 10.8* 14.2* 9.5 8.3  HGB 8.5* 8.5* 8.8* 8.3* 7.6*  HCT 27.5* 25.8* 27.5* 24.9* 22.7*  MCV 94.2 88.4 90.2 88.0 88.0  PLT 110* 203 180 166 190      Lab Results  Component Value Date   HEPBSAG NON REACTIVE 06/23/2020   HEPBSAB NON REACTIVE 06/23/2020   HEPBIGM NON REACTIVE 06/23/2020      Microbiology:  Recent Results (from the past 240 hour(s))  SARS Coronavirus 2 by RT PCR (hospital order, performed in [facility] hospital lab) Nasopharyngeal Nasopharyngeal Swab     Status: None   Collection Time: 06/22/20  4:33 PM   Specimen: Nasopharyngeal Swab  Result Value Ref Range Status   SARS Coronavirus 2 NEGATIVE NEGATIVE Final    Comment: (NOTE) SARS-CoV-2 target nucleic acids are NOT DETECTED.  The SARS-CoV-2 RNA is generally detectable in upper and lower respiratory specimens during the acute phase of infection. The lowest concentration of SARS-CoV-2 viral copies this assay can detect is 250 copies / mL. A negative result does not preclude SARS-CoV-2 infection and should not be used as the sole basis for treatment or other patient management decisions.  A negative result may occur with improper specimen collection / handling, submission of specimen other than nasopharyngeal swab, presence of viral mutation(s) within the areas targeted by this assay, and inadequate number of viral copies (<250 copies / mL). A negative result must be combined with clinical observations, patient history, and epidemiological information.  Fact Sheet for Patients:   StrictlyIdeas.no  Fact Sheet for Healthcare  Providers: BankingDealers.co.za  This test is not yet approved or  cleared by the [place] FDA and has been authorized for detection and/or diagnosis of SARS-CoV-2 by FDA under an Emergency Use Authorization (EUA).  This EUA will remain in effect (meaning this test can be used) for the duration of the COVID-19 declaration under Section 564(b)(1) of the Act, 21 U.S.C. section 360bbb-3(b)(1), unless the authorization is terminated or revoked sooner.  Performed at [facility], [address] [place], [place] [address]     Coagulation Studies: Recent Labs    06/27/20 1128  LABPROT 12.8  INR 1.0    Urinalysis: No results for input(s): COLORURINE, LABSPEC, PHURINE, GLUCOSEU, HGBUR, BILIRUBINUR, KETONESUR, PROTEINUR, UROBILINOGEN, NITRITE, LEUKOCYTESUR in the last 72 hours.  Invalid input(s): APPERANCEUR    Imaging: US BIOPSY (KIDNEY)  Result Date: 06/28/2020 INDICATION: Chronic kidney disease, acute kidney injury, hematuria and need for renal biopsy for further workup. EXAM: ULTRASOUND GUIDED CORE BIOPSY OF RIGHT KIDNEY MEDICATIONS: None. ANESTHESIA/SEDATION: Fentanyl 50 mcg IV; Versed 1.0 mg IV Moderate Sedation Time:  21 minutes. The patient was continuously monitored during the procedure by the interventional radiology nurse under my direct supervision. PROCEDURE: The procedure, risks, benefits, and alternatives were explained to the patient. Questions regarding the procedure were encouraged and answered. The patient understands and consents to the procedure. A time-out was performed prior to initiating the procedure. Both kidneys were examined in a prone position. The right was chosen for biopsy. The right flank region was prepped with chlorhexidine in a sterile fashion, and a sterile drape was applied covering the operative field. A sterile gown and sterile gloves were used for the procedure. Local anesthesia was provided with 1% Lidocaine. A 15  gauge trocar needle was advanced to the level of lower pole cortex of the right kidney. Under ultrasound guidance, 2 separate 16 gauge core biopsy samples were obtained. Core biopsy samples were submitted on saline soaked Telfa gauze. A slurry of Gel-Foam pledgets mixed in normal saline was then injected through the 15 gauge needle as the needle was retracted and removed. Additional ultrasound was performed. COMPLICATIONS: None immediate. FINDINGS: Both kidneys are visible by ultrasound. The right kidney was less deep in location and better visualized with slightly thicker renal cortex compared to the left. Solid core biopsy samples were obtained. IMPRESSION: Ultrasound-guided core biopsy performed at the level of right lower pole renal cortex. Electronically Signed   By: Jenness Corner.D.  On: 06/28/2020 10:42     Medications:   . sodium chloride 250 mL (06/28/20 0938)   . amLODipine  5 mg Oral Daily  . Chlorhexidine Gluconate Cloth  6 each Topical Q0600  . docusate sodium  200 mg Oral Daily  . epoetin (EPOGEN/PROCRIT) injection  4,000 Units Intravenous Q T,Th,Sa-HD  . insulin aspart  0-9 Units Subcutaneous TID WC  . levothyroxine  150 mcg Oral QAC breakfast  . melatonin  5 mg Oral QHS  . metoprolol tartrate  50 mg Oral Q8H  . scopolamine  1 patch Transdermal Q72H  . sodium chloride flush  3 mL Intravenous Q12H   sodium chloride, ALPRAZolam, HYDROcodone-acetaminophen, morphine injection, ondansetron (ZOFRAN) IV, ondansetron, polyvinyl alcohol, sodium chloride, sodium chloride flush  Assessment/ Plan:  86 y.o. 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 Treated with high-dose steroids but then lost to follow-up Currently requiring dialysis. First session June 25, 2020 Review of outpatient records show that creatinine was baseline in August 2019 at 1.35, GFR 36 Creatinine trend has been increasing since April 23, 2020   Receiving dialysis today Discharge planning for outpatient dialysis  Repeat biopsy done on September 27th,  results awaited. Stay off hydralazine  Serologies from September 22 show ANA positive,p-ANCA positive, titer 1: 640, low C3, elevated kappa to lambda ratio of 34.6          LOS: 7 Pami Wool 9/28/20211:51 PM  [facility] [place], [place]

## 2020-06-29 NOTE — Progress Notes (Signed)
OT Cancellation Note  Patient Details Name: [name] MRN: [id number] DOB: 01/14/1934   Cancelled Treatment:    Reason Eval/Treat Not Completed: Patient declined, no reason specified  OT f/u for evaluation after pt returned from HD, but she states she feels "too bad to do anything right now". OT encourages pt to try to work with therapy tomorrow and pt agreeable. Will f/u next date for therapy evaluation. Thank you.  Gerrianne Scale, [place], OTR/L ascom [phone] 06/29/20, 3:27 PM

## 2020-06-29 NOTE — Progress Notes (Signed)
PROGRESS NOTE    [name]  NAT:[id number] DOB: Nov 24, 1933 DOA: 06/22/2020 PCP: Idelle Crouch, MD   Assessment & Plan:   Principal Problem:   Hyperkalemia Active Problems:   Diabetes mellitus ([place])   Hypertension   S/P CABG x 4   Cerebral embolism with cerebral infarction (HCC)   CAD (coronary artery disease)   Thyroid disease   HTN, goal below 140/80   CKD (chronic kidney disease) stage 5, GFR less than 15 ml/min (HCC)   Carotid stenosis   Prolonged QT interval   AKI on CKDIIIb: currently stage V, w/ hematuria. 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 Cr is labile. S/p permacath 06/25/20. Continue on HD as per nephro. W/ hx of ANCA pauci-immune glomerulonephritis. Completed course of stress dose steroids. S/p kidney biopsy 06/28/20, pathology pending. Will need outpatient HD spot prior to d/c. Nephro following and recs apprec   ACD: likely secondary to CKD. Continue on epo as per nephro. Will transfuse if Hb <7.0  Hx of ANCA vasculitis: not on any treatments outpatient as per med rec  Hyperkalemia: within normal limits today. Will continue to monitor    Thrombocytopenia: resolved   CAD: s/p CABG. Continue on home dose of metoprolol. Continue to hold aspirn, plavix   Hx of CVA: recent July 2021 w/ residual right arm weakness. Hold aspirin, plavix as per nephro   Carotid stenosis: recent IR procedure showed 30-40% left ICA stenosis, no acute intervention needed  DM2: continue to hold home dose of glimepiride. Will continue on SSI w/ accuchecks   Chronic pain: continue on norco, morphine   Anxiety: severity unknown. 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 Continue to hold home dose of lasix secondary CKD. BP is WNL currently   Hypothyroidism: continue on home dose of levothyroxine      DVT prophylaxis: heparin SQ Code Status: full Family Communication: discussed pt's care w/ pt's husband  again who is at bedside and answered his questions  Disposition Plan: depends on PT/OT recs  Status is: Inpatient  Remains inpatient appropriate because:Ongoing diagnostic testing needed not appropriate for outpatient work up, IV treatments appropriate due to intensity of illness or inability to take PO and Inpatient level of care appropriate due to severity of illness awaiting kidney biopsy results, still needs outpatient HD prior to d/c    Dispo: The patient is from: Home              Anticipated d/c is to: Home vs SNF              Anticipated d/c date is: > 3 days              Patient currently is not medically stable to d/c.    Consultants:   nephro   Vascular surg   Procedures:   Antimicrobials:    Subjective: Pt c/o fatigue  Objective: Vitals:   06/28/20 2352 06/29/20 0407 06/29/20 0409 06/29/20 0429  BP: (!) 124/55 (!) 123/53 126/63 (!) 132/58  Pulse: 79 84  81  Resp: 16   16  Temp: 97.9 F (36.6 C)   97.9 F (36.6 C)  TempSrc: Oral   Oral  SpO2: 98%   94%  Weight:      Height:        Intake/Output Summary (Last 24 hours) at 06/29/2020 0741 Last data filed at 06/28/2020 2143 Gross per 24 hour  Intake 53.67 ml  Output 850 ml  Net -796.33  ml   Filed Weights   06/25/20 1400  Weight: 93.9 kg    Examination:  General exam: Appears calm & comfortable  Respiratory system: clear breath sounds b/l. No rales, wheezes Cardiovascular system: S1/S2+. No rubs or gallops Gastrointestinal system: Abdomen is nondistended, soft and nontender. Normal bowel sounds  Central nervous system: Alert and oriented. Moves all 4 extremities  Psychiatry: Judgement and insight appear normal. Flat mood and affect     Data Reviewed: I have personally reviewed following labs and imaging studies  CBC: Recent Labs  Lab 06/25/20 0435 06/26/20 0719 06/27/20 0316 06/28/20 0504 06/29/20 0416  WBC 9.7 10.8* 14.2* 9.5 8.3  HGB 8.5* 8.5* 8.8* 8.3* 7.6*  HCT 27.5* 25.8*  27.5* 24.9* 22.7*  MCV 94.2 88.4 90.2 88.0 88.0  PLT 110* 203 180 166 009   Basic Metabolic Panel: Recent Labs  Lab 06/24/20 0740 06/24/20 0740 06/25/20 0435 06/26/20 0719 06/27/20 0316 06/28/20 0504 06/29/20 0416  NA 132*   133*   < > 137   137 138 139 137 133*  K 5.5*   5.5*   < > 4.8   4.8 4.7 4.6 4.4 4.9  CL 99   99   < > 101   102 103 103 100 98  CO2 23   23   < > 24   23 26 27 26 23   GLUCOSE 207*   209*   < > 136*   136* 135* 137* 117* 136*  BUN 70*   73*   < > 84*   85* 69* 48* 67* 88*  CREATININE 5.66*   5.72*   < > 6.09*   6.15* 5.63* 5.27* 7.43* 8.94*  CALCIUM 7.9*   7.9*   < > 7.4*   7.4* 7.2* 7.4* 7.4* 7.2*  PHOS 6.1*  --  6.1*  --   --  6.8*  --    < > = values in this interval not displayed.   GFR: Estimated Creatinine Clearance: 5.3 mL/min (A) (by C-G formula based on SCr of 8.94 mg/dL (H)). Liver Function Tests: Recent Labs  Lab 06/22/20 1412 06/24/20 0740 06/25/20 0435  AST 10*  --   --   ALT 6  --   --   ALKPHOS 78  --   --   BILITOT 0.7  --   --   PROT 7.9  --   --   ALBUMIN 3.1* 2.9* 2.9*   No results for input(s): LIPASE, AMYLASE in the last 168 hours. No results for input(s): AMMONIA in the last 168 hours. Coagulation Profile: Recent Labs  Lab 06/23/20 0444 06/27/20 1128  INR 1.0 1.0   Cardiac Enzymes: Recent Labs  Lab 06/23/20 1156  CKTOTAL 26*   BNP (last 3 results) No results for input(s): PROBNP in the last 8760 hours. HbA1C: No results for input(s): HGBA1C in the last 72 hours. CBG: Recent Labs  Lab 06/27/20 2143 06/28/20 0802 06/28/20 1227 06/28/20 1624 06/28/20 2132  GLUCAP 110* 117* 101* 103* 152*   Lipid Profile: No results for input(s): CHOL, HDL, LDLCALC, TRIG, CHOLHDL, LDLDIRECT in the last 72 hours. Thyroid Function Tests: No results for input(s): TSH, T4TOTAL, FREET4, T3FREE, THYROIDAB in the last 72 hours. Anemia Panel: No results for input(s): VITAMINB12, FOLATE, FERRITIN, TIBC, IRON, RETICCTPCT in the  last 72 hours. Sepsis Labs: No results for input(s): PROCALCITON, LATICACIDVEN in the last 168 hours.  Recent Results (from the past 240 hour(s))  SARS Coronavirus 2 by RT PCR (hospital  order, performed in [facility] hospital lab) Nasopharyngeal Nasopharyngeal Swab     Status: None   Collection Time: 06/22/20  4:33 PM   Specimen: Nasopharyngeal Swab  Result Value Ref Range Status   SARS Coronavirus 2 NEGATIVE NEGATIVE Final    Comment: (NOTE) SARS-CoV-2 target nucleic acids are NOT DETECTED.  The SARS-CoV-2 RNA is generally detectable in upper and lower respiratory specimens during the acute phase of infection. The lowest concentration of SARS-CoV-2 viral copies this assay can detect is 250 copies / mL. A negative result does not preclude SARS-CoV-2 infection and should not be used as the sole basis for treatment or other patient management decisions.  A negative result may occur with improper specimen collection / handling, submission of specimen other than nasopharyngeal swab, presence of viral mutation(s) within the areas targeted by this assay, and inadequate number of viral copies (<250 copies / mL). A negative result must be combined with clinical observations, patient history, and epidemiological information.  Fact Sheet for Patients:   StrictlyIdeas.no  Fact Sheet for Healthcare Providers: BankingDealers.co.za  This test is not yet approved or  cleared by the [place] FDA and has been authorized for detection and/or diagnosis of SARS-CoV-2 by FDA under an Emergency Use Authorization (EUA).  This EUA will remain in effect (meaning this test can be used) for the duration of the COVID-19 declaration under Section 564(b)(1) of the Act, 21 U.S.C. section 360bbb-3(b)(1), unless the authorization is terminated or revoked sooner.  Performed at [facility], [address] [place], [place] [address]           Radiology Studies: US BIOPSY (KIDNEY)  Result Date: 06/28/2020 INDICATION: Chronic kidney disease, acute kidney injury, hematuria and need for renal biopsy for further workup. EXAM: ULTRASOUND GUIDED CORE BIOPSY OF RIGHT KIDNEY MEDICATIONS: None. ANESTHESIA/SEDATION: Fentanyl 50 mcg IV; Versed 1.0 mg IV Moderate Sedation Time:  21 minutes. The patient was continuously monitored during the procedure by the interventional radiology nurse under my direct supervision. PROCEDURE: The procedure, risks, benefits, and alternatives were explained to the patient. Questions regarding the procedure were encouraged and answered. The patient understands and consents to the procedure. A time-out was performed prior to initiating the procedure. Both kidneys were examined in a prone position. The right was chosen for biopsy. The right flank region was prepped with chlorhexidine in a sterile fashion, and a sterile drape was applied covering the operative field. A sterile gown and sterile gloves were used for the procedure. Local anesthesia was provided with 1% Lidocaine. A 15 gauge trocar needle was advanced to the level of lower pole cortex of the right kidney. Under ultrasound guidance, 2 separate 16 gauge core biopsy samples were obtained. Core biopsy samples were submitted on saline soaked Telfa gauze. A slurry of Gel-Foam pledgets mixed in normal saline was then injected through the 15 gauge needle as the needle was retracted and removed. Additional ultrasound was performed. COMPLICATIONS: None immediate. FINDINGS: Both kidneys are visible by ultrasound. The right kidney was less deep in location and better visualized with slightly thicker renal cortex compared to the left. Solid core biopsy samples were obtained. IMPRESSION: Ultrasound-guided core biopsy performed at the level of right lower pole renal cortex. Electronically Signed   By: Aletta Edouard M.D.   On: 06/28/2020 10:42        Scheduled Meds:   amLODipine  5 mg Oral Daily   Chlorhexidine Gluconate Cloth  6 each Topical Q0600   docusate sodium  200  mg Oral Daily   epoetin (EPOGEN/PROCRIT) injection  4,000 Units Intravenous Q T,Th,Sa-HD   insulin aspart  0-9 Units Subcutaneous TID WC   levothyroxine  150 mcg Oral QAC breakfast   melatonin  5 mg Oral QHS   metoprolol tartrate  50 mg Oral Q8H   scopolamine  1 patch Transdermal Q72H   sodium chloride flush  3 mL Intravenous Q12H   Continuous Infusions:  sodium chloride 250 mL (06/28/20 0938)     LOS: 7 days    Time spent: 30 mins     Wyvonnia Dusky, MD [facility] Pager [phone]  If 7PM-7AM, please contact night-coverage [email or web address] 06/29/2020, 7:41 AM

## 2020-06-29 NOTE — Progress Notes (Signed)
PT Cancellation Note  Patient Details Name: [name] MRN: [id number] DOB: 14-Feb-1934   Cancelled Treatment:    Reason Eval/Treat Not Completed: Other (comment) (Pt back from dialysis but states she does not have energy to participate with PT as she has not eaten today. Pt asks PT to check back after she's eaten. Pt was notified that PT might not have time to come back today for evaluation. Pt agreeable for PT evaluation tomorrow morning, if PT unable to return for eval today. RN notified.)  Herminio Commons, PT, DPT 1:53 PM,06/29/20

## 2020-06-30 LAB — BASIC METABOLIC PANEL
Anion gap: 11 (ref 5–15)
BUN: 52 mg/dL — ABNORMAL HIGH (ref 8–23)
CO2: 25 mmol/L (ref 22–32)
Calcium: 7.4 mg/dL — ABNORMAL LOW (ref 8.9–10.3)
Chloride: 98 mmol/L (ref 98–111)
Creatinine, Ser: 7.11 mg/dL — ABNORMAL HIGH (ref 0.44–1.00)
GFR calc Af Amer: 6 mL/min — ABNORMAL LOW (ref >60)
GFR calc non Af Amer: 5 mL/min — ABNORMAL LOW (ref >60)
Glucose, Bld: 104 mg/dL — ABNORMAL HIGH (ref 70–99)
Potassium: 4.6 mmol/L (ref 3.5–5.1)
Sodium: 134 mmol/L — ABNORMAL LOW (ref 135–145)

## 2020-06-30 LAB — GLUCOSE, CAPILLARY
Glucose-Capillary: 101 mg/dL — ABNORMAL HIGH (ref 70–99)
Glucose-Capillary: 94 mg/dL (ref 70–99)
Glucose-Capillary: 94 mg/dL (ref 70–99)
Glucose-Capillary: 109 mg/dL — ABNORMAL HIGH (ref 70–99)

## 2020-06-30 LAB — CBC
HCT: 19 % — ABNORMAL LOW (ref 36.0–46.0)
Hemoglobin: 6.3 g/dL — ABNORMAL LOW (ref 12.0–15.0)
MCH: 29 pg (ref 26.0–34.0)
MCHC: 33.2 g/dL (ref 30.0–36.0)
MCV: 87.6 fL (ref 80.0–100.0)
Platelets: 180 10*3/uL (ref 150–400)
RBC: 2.17 MIL/uL — ABNORMAL LOW (ref 3.87–5.11)
RDW: 15.8 % — ABNORMAL HIGH (ref 11.5–15.5)
WBC: 6.5 10*3/uL (ref 4.0–10.5)
nRBC: 0 % (ref 0.0–0.2)

## 2020-06-30 LAB — PREPARE RBC (CROSSMATCH)

## 2020-06-30 LAB — HEMOGLOBIN: Hemoglobin: 7 g/dL — ABNORMAL LOW (ref 12.0–15.0)

## 2020-06-30 MED ORDER — SODIUM CHLORIDE 0.9% IV SOLUTION: Freq: Once | INTRAVENOUS | Status: DC

## 2020-06-30 MED ORDER — PREDNISONE 20 MG PO TABS
20.0000 mg | ORAL_TABLET | Freq: Two times a day (BID) | ORAL | Status: DC
  Administered 2020-06-30 – 2020-07-02 (×3): 20 mg via ORAL
  Filled 2020-06-30 (×3): qty 1

## 2020-06-30 MED ORDER — EPOETIN ALFA 10000 UNIT/ML IJ SOLN
10000.0000 [IU] | INTRAMUSCULAR | Status: DC
  Filled 2020-06-30: qty 1

## 2020-06-30 NOTE — Evaluation (Signed)
Occupational Therapy Evaluation Patient Details Name: [name] MRN: [id number] DOB: 14-Feb-1934 Today's Date: 06/30/2020    History of Present Illness Pt is 86 y/o F with PMH: HTN, T2DM, CAD s/p CABG x4, GERD, CVAs, carotid stenosis, and stage IIIb CKD. Pt was seen by PCP on 9/20 and when lab results came back next day, he informed pt to go to hospital for worsening kidney function. Pt adm with hyperkalemia and AKI on CKD (currently stage 5).   Clinical Impression   Pt was seen for OT evaluation this date. Prior to hospital admission, pt was MOD I with fxl mobility with furniture walking some community mobility with 2WW, but reports very limited. Pt requires some intermittent LB ADL assistance from her spouse and reports her husband performs most IADLs such as getting groceries/cooking. Pt lives with spouse in [facility] with 1 STE with b/l railing. Currently pt demonstrates impairments as described below (See OT problem list) which functionally limit her ability to perform ADL/self-care tasks. Pt currently requires MIN/MOD A with RW for ADL transfers, MIN A with RW for fxl mobility, and requires MOD/MAX A for LB ADLs.  Pt would benefit from skilled OT services to address noted impairments and functional limitations (see below for any additional details) in order to maximize safety and independence while minimizing falls risk and caregiver burden. Upon hospital discharge, recommend STR to maximize pt safety and return to PLOF.     Follow Up Recommendations  SNF    Equipment Recommendations  None recommended by OT (reports having all necessary equipment)    Recommendations for Other Services       Precautions / Restrictions Precautions Precautions: Fall Restrictions Weight Bearing Restrictions: No      Mobility Bed Mobility Overal bed mobility: Needs Assistance Bed Mobility: Supine to Sit     Supine to sit: Mod assist;HOB elevated     General bed mobility comments: to manage  trunk  Transfers Overall transfer level: Needs assistance Equipment used: Rolling walker (2 wheeled) Transfers: Sit to/from [facility] Sit to Stand: Min assist;Mod assist;From elevated surface Stand pivot transfers: Min assist;Mod assist       General transfer comment: cues for safe hand placement with RW for ADL transfers, MIN/MOD A initially fromelevated surface, but then able to perform transfers from [facility] sit<>stand with MIN A on subsequent attempts. Progressed throughout session.    Balance Overall balance assessment: Needs assistance Sitting-balance support: Feet supported Sitting balance-Leahy Scale: Good Sitting balance - Comments: OT assists pt into more sqaure EOB sitting with good foot support and she demos G static sitting     Standing balance-Leahy Scale: Poor Standing balance comment: requires MIN A and RW for B UE support.                           ADL either performed or assessed with clinical judgement   ADL Overall ADL's : Needs assistance/impaired     Grooming: Set up;Sitting;Wash/dry hands Grooming Details (indicate cue type and reason): cues for thorough completion, pt reports having macular degen and not being able to see         Upper Body Dressing : Minimal assistance;Sitting   Lower Body Dressing: Moderate assistance;Maximal assistance;Sit to/from stand Lower Body Dressing Details (indicate cue type and reason): based on clinical observation Toilet Transfer: Minimal assistance;Moderate assistance;BSC;RW;Stand-pivot Toilet Transfer Details (indicate cue type and reason): SPS MIN/MOD initially to commode, able to CTS from commode after using with  MIN A with RW. Requires cues for safe hand placement with RW. Toileting- Clothing Manipulation and Hygiene: Moderate assistance;Maximal assistance;Sit to/from stand Toileting - Clothing Manipulation Details (indicate cue type and reason): with RW, to perform thorough peri care in  standing after BM in [facility].     Functional mobility during ADLs: Minimal assistance;Rolling walker (to take 3-4 steps from [facility] to chair.)       Vision Patient Visual Report: No change from baseline Additional Comments: reports h/o macular degeneration     Perception     Praxis      Pertinent Vitals/Pain Pain Assessment: Faces Faces Pain Scale: Hurts little more Pain Location: back pain, but reports significantly lessens once she comes to sitting and her back is off the bed. Pain Descriptors / Indicators: Sore Pain Intervention(s): Monitored during session;Repositioned     Hand Dominance Right   Extremity/Trunk Assessment Upper Extremity Assessment Upper Extremity Assessment: RUE deficits/detail;LUE deficits/detail RUE Deficits / Details: shld to 3/4 range. Elbow and grip MMT grossly 3+/5 LUE Deficits / Details: ROM WFL, MMT grossly 4-/5 throughout   Lower Extremity Assessment Lower Extremity Assessment: Defer to PT evaluation;Generalized weakness (limited ROM in knees-reports h/o arthritis)       Communication Communication Communication: No difficulties   Cognition Arousal/Alertness: Awake/alert Behavior During Therapy: WFL for tasks assessed/performed Overall Cognitive Status: Within Functional Limits for tasks assessed                                 General Comments: alert and pleasant, somewhat HOH. Able to follow 1-2 step commands.   General Comments  pt reports some dizziness on initial stand and not on subsequent attempts (reports dizziness "went away"). BP taken and 123/54 (71) sitting after sit<>stand trials.    Exercises Other Exercises Other Exercises: OT faciltiates ed re: role of OT in acute setting, importance of OOB activity, safety considerations including safe use of RW, use of call light to get up, and notified of chair alarm. Pt with good understanding.   Shoulder Instructions      Home Living Family/patient expects to be  discharged to:: Private residence Living Arrangements: Spouse/significant other Available Help at Discharge: Family;Available 24 hours/day Type of Home: House Home Access: Stairs to enter [employer] of Steps: 1 Entrance Stairs-Rails: Can reach both Home Layout: One level     Bathroom Shower/Tub: Teacher, early years/pre: Standard     Home Equipment: Environmental consultant - 2 wheels;Bedside commode;Shower seat          Prior Functioning/Environment Level of Independence: Needs assistance  Gait / Transfers Assistance Needed: Pt states that she has 2ww (since CVA ~2010) and uses in community, but describes furniture walking in the home d/t narrow pathways. ADL's / Homemaking Assistance Needed: Pt states she is able to dress (slip on shoes no socks), bathe and toilet with MOD I, but states that her husband does help her as needed for socks/shoes with laces. States that he does the driving/gets groceries. States that he does most HH IADLs.            OT Problem List: Decreased strength;Decreased range of motion;Decreased activity tolerance;Impaired balance (sitting and/or standing);Decreased knowledge of use of DME or AE      OT Treatment/Interventions: Self-care/ADL training;DME and/or AE instruction;Therapeutic activities;Balance training;Therapeutic exercise;Neuromuscular education;Patient/family education    OT Goals(Current goals can be found in the care plan section) Acute Rehab OT Goals Patient Stated  Goal: to go home OT Goal Formulation: With patient/family Time For Goal Achievement: 07/14/20 Potential to Achieve Goals: Fair ADL Goals Pt Will Perform Lower Body Dressing: with caregiver independent in assisting;sit to/from stand;with min guard assist Pt Will Transfer to Toilet: with supervision;ambulating;grab bars (with LRAD) Pt Will Perform Toileting - Clothing Manipulation and hygiene: with supervision;sit to/from stand Pt/caregiver will Perform Home Exercise  Program: Increased strength;Both right and left upper extremity;With minimal assist  OT Frequency: Min 1X/week   Barriers to D/C:            Co-evaluation              AM-PAC OT "6 Clicks" Daily Activity     Outcome Measure Help from another person eating meals?: None Help from another person taking care of personal grooming?: A Little Help from another person toileting, which includes using toliet, bedpan, or urinal?: A Lot Help from another person bathing (including washing, rinsing, drying)?: A Lot Help from another person to put on and taking off regular upper body clothing?: A Little Help from another person to put on and taking off regular lower body clothing?: A Lot 6 Click Score: 16   End of Session Equipment Utilized During Treatment: Gait belt;Rolling walker Nurse Communication: Mobility status;Other (comment) (notified of small soft BM)  Activity Tolerance: Patient tolerated treatment well Patient left: in chair;with call bell/phone within reach;with chair alarm set  OT Visit Diagnosis: Unsteadiness on feet (R26.81);Muscle weakness (generalized) (M62.81)                Time: 9741-6384 OT Time Calculation (min): 41 min Charges:  OT General Charges $OT Visit: 1 Visit OT Evaluation $OT Eval Moderate Complexity: 1 Mod OT Treatments $Self Care/Home Management : 8-22 mins $Therapeutic Activity: 8-22 mins  Gerrianne Scale, MS, OTR/L ascom [phone] 06/30/20, 1:15 PM

## 2020-06-30 NOTE — Progress Notes (Signed)
Pt ordered blood transfusion for hgb of 6.3 educated patient on the rationale and about how the blood is matched. Explained that there is small risk for reaction but this nurse would remain by her side the first 15 minutes to monitor her and vital signs and continue to monitor while transfusion was running. Pt discussed with husband and was unsure about getting " someone else's blood". Asked for a re draw on hgb and is 7.0 md still suggests transfusion. Explained normal levels of hgb and transfusion levels to pt and husband.Pt decided to hold off on the transfusion at this time and would maybe like her hgb checked this evening. Will continue to educate.

## 2020-06-30 NOTE — Progress Notes (Signed)
[place], [place] 06/30/20  Subjective:   Hospital day # 8.  Renal: No intake/output data recorded. Lab Results  Component Value Date   CREATININE 7.11 (H) 06/30/2020   CREATININE 8.94 (H) 06/29/2020   CREATININE 7.43 (H) 06/28/2020   Patient sitting in a chair, getting attended by the physical therapist. Husband in the room. Outpatient dialysis options discussed with the patient and husband. Renal biopsy results still awaited.Patient c/o nausea and anorexia.  Objective:  Vital signs in last 24 hours:  Temp:  [97.6 F (36.4 C)-98.4 F (36.9 C)] 97.6 F (36.4 C) (09/29 0727) Pulse Rate:  [83-96] 91 (09/29 0727) Resp:  [16-24] 16 (09/29 0727) BP: (109-137)/(43-81) 126/51 (09/29 0727) SpO2:  [90 %-100 %] 99 % (09/29 0727)  Weight change:  Filed Weights   06/25/20 1400  Weight: 93.9 kg    Intake/Output:    Intake/Output Summary (Last 24 hours) at 06/30/2020 1121 Last data filed at 06/30/2020 1015 Gross per 24 hour  Intake 240 ml  Output 0 ml  Net 240 ml   Physical Exam: General:  No acute distress, pleasant  HEENT  anicteric, moist oral mucous membrane  Pulm/lungs  Respirations even,unlabored, lungs are clear to auscultation  CVS/Heart  S1S2+, no rub or gallop  Abdomen:   Soft, nontender  Extremities:  No peripheral edema  Neurologic:  Alert, oriented  Skin:  No acute lesions or rashes  Right IJ PermCath    Basic Metabolic Panel:  Recent Labs  Lab 06/24/20 0740 06/24/20 0740 06/25/20 0435 06/25/20 0435 06/26/20 0719 06/26/20 0719 06/27/20 0316 06/27/20 0316 06/28/20 0504 06/29/20 0416 06/30/20 0611  NA 132*  133*   < > 137  137   < > 138  --  139  --  137 133* 134*  K 5.5*  5.5*   < > 4.8  4.8   < > 4.7  --  4.6  --  4.4 4.9 4.6  CL 99  99   < > 101  102   < > 103  --  103  --  100 98 98  CO2 23  23   < > 24  23   < > 26  --  27  --  26 23 25   GLUCOSE 207*  209*   < > 136*  136*   < > 135*  --  137*  --   117* 136* 104*  BUN 70*  73*   < > 84*  85*   < > 69*  --  48*  --  67* 88* 52*  CREATININE 5.66*  5.72*   < > 6.09*  6.15*   < > 5.63*  --  5.27*  --  7.43* 8.94* 7.11*  CALCIUM 7.9*  7.9*   < > 7.4*  7.4*   < > 7.2*   < > 7.4*   < > 7.4* 7.2* 7.4*  PHOS 6.1*  --  6.1*  --   --   --   --   --  6.8*  --   --    < > = values in this interval not displayed.     CBC: Recent Labs  Lab 06/26/20 0719 06/27/20 0316 06/28/20 0504 06/29/20 0416 06/30/20 0611  WBC 10.8* 14.2* 9.5 8.3 6.5  HGB 8.5* 8.8* 8.3* 7.6* 6.3*  HCT 25.8* 27.5* 24.9* 22.7* 19.0*  MCV 88.4 90.2 88.0 88.0 87.6  PLT 203 180 166 190 180      Lab  Results  Component Value Date   HEPBSAG NON REACTIVE 06/23/2020   HEPBSAB NON REACTIVE 06/23/2020   HEPBIGM NON REACTIVE 06/23/2020      Microbiology:  Recent Results (from the past 240 hour(s))  SARS Coronavirus 2 by RT PCR (hospital order, performed in [facility] hospital lab) Nasopharyngeal Nasopharyngeal Swab     Status: None   Collection Time: 06/22/20  4:33 PM   Specimen: Nasopharyngeal Swab  Result Value Ref Range Status   SARS Coronavirus 2 NEGATIVE NEGATIVE Final    Comment: (NOTE) SARS-CoV-2 target nucleic acids are NOT DETECTED.  The SARS-CoV-2 RNA is generally detectable in upper and lower respiratory specimens during the acute phase of infection. The lowest concentration of SARS-CoV-2 viral copies this assay can detect is 250 copies / mL. A negative result does not preclude SARS-CoV-2 infection and should not be used as the sole basis for treatment or other patient management decisions.  A negative result may occur with improper specimen collection / handling, submission of specimen other than nasopharyngeal swab, presence of viral mutation(s) within the areas targeted by this assay, and inadequate number of viral copies (<250 copies / mL). A negative result must be combined with clinical observations, patient history, and epidemiological  information.  Fact Sheet for Patients:   StrictlyIdeas.no  Fact Sheet for Healthcare Providers: BankingDealers.co.za  This test is not yet approved or  cleared by the [place] FDA and has been authorized for detection and/or diagnosis of SARS-CoV-2 by FDA under an Emergency Use Authorization (EUA).  This EUA will remain in effect (meaning this test can be used) for the duration of the COVID-19 declaration under Section 564(b)(1) of the Act, 21 U.S.C. section 360bbb-3(b)(1), unless the authorization is terminated or revoked sooner.  Performed at [facility], [address] [place], [place] [address]     Coagulation Studies: Recent Labs    06/27/20 1128  LABPROT 12.8  INR 1.0    Urinalysis: No results for input(s): COLORURINE, LABSPEC, PHURINE, GLUCOSEU, HGBUR, BILIRUBINUR, KETONESUR, PROTEINUR, UROBILINOGEN, NITRITE, LEUKOCYTESUR in the last 72 hours.  Invalid input(s): APPERANCEUR    Imaging: No results found.   Medications:   . sodium chloride 250 mL (06/28/20 0938)   . amLODipine  5 mg Oral Daily  . Chlorhexidine Gluconate Cloth  6 each Topical Q0600  . docusate sodium  200 mg Oral Daily  . epoetin (EPOGEN/PROCRIT) injection  4,000 Units Intravenous Q T,Th,Sa-HD  . insulin aspart  0-9 Units Subcutaneous TID WC  . levothyroxine  150 mcg Oral QAC breakfast  . melatonin  5 mg Oral QHS  . metoprolol tartrate  50 mg Oral Q8H  . scopolamine  1 patch Transdermal Q72H  . sodium chloride flush  3 mL Intravenous Q12H   sodium chloride, ALPRAZolam, HYDROcodone-acetaminophen, morphine injection, ondansetron (ZOFRAN) IV, ondansetron, polyvinyl alcohol, sodium chloride, sodium chloride flush  Assessment/ Plan:  86 y.o. 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 Treated with high-dose steroids but then lost to follow-up Currently requiring dialysis. First session June 25, 2020 Review of outpatient records show that creatinine was baseline in August 2019 at  1.35, GFR 36 Creatinine trend has been increasing since April 23, 2020 Creatinine today is 7.11  Dialysis received today Discharge planning in progress for outpatient dialysis  Repeat biopsy done on September 27th,  results awaited. Stay off hydralazine  Serologies from September 22 show ANA positive,p-ANCA positive, titer 1: 640, low C3, elevated kappa to lambda ratio of 34.6  # Hypertension BP readings within acceptable range, on Amlodipine and Metoprolol.  # Anemia of Chronic Disease Hgb today is 6.3  Low dose  Epogen with dialysis TTS       LOS: 8 Jayleana Colberg 9/29/202111:21 [place], [place]

## 2020-06-30 NOTE — Progress Notes (Signed)
PT Cancellation Note  Patient Details Name: [name] MRN: [id number] DOB: 1933/11/20   Cancelled Treatment:    Reason Eval/Treat Not Completed: Other (comment) (Per attending MD, pt not appropriate for PT today due to low CBC. Pt with orders for RBC transfusion. Will follow up with PT evaluation tomorrow when pt is medically stable.)    Herminio Commons, PT, DPT 1:05 PM,06/30/20

## 2020-06-30 NOTE — Progress Notes (Signed)
PROGRESS NOTE    [name]  RXV:[id number] DOB: 05/24/1934 DOA: 06/22/2020 PCP: Idelle Crouch, MD    Brief Narrative: pt presented due to worsening of renal function    Consultants:   Nephrolgy, vascular surgery  Procedures:   Antimicrobials:       Subjective: Felt little dizzy when she got up. No sob or cp  Objective: Vitals:   06/30/20 0727 06/30/20 1324 06/30/20 1539 06/30/20 2128  BP: (!) 126/51 126/66 (!) 126/49 125/62  Pulse: 91 83 90 81  Resp: 16  16   Temp: 97.6 F (36.4 C)  98.5 F (36.9 C)   TempSrc: Oral  Oral   SpO2: 99%  98%   Weight:      Height:        Intake/Output Summary (Last 24 hours) at 06/30/2020 2141 Last data filed at 06/30/2020 1856 Gross per 24 hour  Intake 600 ml  Output --  Net 600 ml   Filed Weights   06/25/20 1400  Weight: 93.9 kg    Examination:  General exam: Appears calm and comfortable , pale Respiratory system: Clear to auscultation. Respiratory effort normal. Cardiovascular system: S1 & S2 heard, RRR. No JVD, murmurs, rubs, gallops or clicks. No pedal edema. Gastrointestinal system: Abdomen is nondistended, soft and nontender. No organomegaly or masses felt. Normal bowel sounds heard. Central nervous system: Alert and oriented. No focal neurological deficits. Extremities: no edema Skin: warm, dry Psychiatry: Judgement and insight appear normal. Mood & affect appropriate.     Data Reviewed: I have personally reviewed following labs and imaging studies  CBC: Recent Labs  Lab 06/26/20 0719 06/26/20 0719 06/27/20 0316 06/28/20 0504 06/29/20 0416 06/30/20 0611 06/30/20 1319  WBC 10.8*  --  14.2* 9.5 8.3 6.5  --   HGB 8.5*   < > 8.8* 8.3* 7.6* 6.3* 7.0*  HCT 25.8*  --  27.5* 24.9* 22.7* 19.0*  --   MCV 88.4  --  90.2 88.0 88.0 87.6  --   PLT 203  --  180 166 190 180  --    < > = values in this interval not displayed.   Basic Metabolic Panel: Recent Labs  Lab 06/24/20 0740 06/24/20 0740  06/25/20 0435 06/25/20 0435 06/26/20 0719 06/27/20 0316 06/28/20 0504 06/29/20 0416 06/30/20 0611  NA 132*  133*   < > 137  137   < > 138 139 137 133* 134*  K 5.5*  5.5*   < > 4.8  4.8   < > 4.7 4.6 4.4 4.9 4.6  CL 99  99   < > 101  102   < > 103 103 100 98 98  CO2 23  23   < > 24  23   < > 26 27 26 23 25   GLUCOSE 207*  209*   < > 136*  136*   < > 135* 137* 117* 136* 104*  BUN 70*  73*   < > 84*  85*   < > 69* 48* 67* 88* 52*  CREATININE 5.66*  5.72*   < > 6.09*  6.15*   < > 5.63* 5.27* 7.43* 8.94* 7.11*  CALCIUM 7.9*  7.9*   < > 7.4*  7.4*   < > 7.2* 7.4* 7.4* 7.2* 7.4*  PHOS 6.1*  --  6.1*  --   --   --  6.8*  --   --    < > = values in this interval not displayed.  GFR: Estimated Creatinine Clearance: 6.7 mL/min (A) (by C-G formula based on SCr of 7.11 mg/dL (H)). Liver Function Tests: Recent Labs  Lab 06/24/20 0740 06/25/20 0435  ALBUMIN 2.9* 2.9*   No results for input(s): LIPASE, AMYLASE in the last 168 hours. No results for input(s): AMMONIA in the last 168 hours. Coagulation Profile: Recent Labs  Lab 06/27/20 1128  INR 1.0   Cardiac Enzymes: No results for input(s): CKTOTAL, CKMB, CKMBINDEX, TROPONINI in the last 168 hours. BNP (last 3 results) No results for input(s): PROBNP in the last 8760 hours. HbA1C: No results for input(s): HGBA1C in the last 72 hours. CBG: Recent Labs  Lab 06/29/20 1639 06/29/20 2150 06/30/20 0818 06/30/20 1217 06/30/20 1624  GLUCAP 105* 100* 101* 94 94   Lipid Profile: No results for input(s): CHOL, HDL, LDLCALC, TRIG, CHOLHDL, LDLDIRECT in the last 72 hours. Thyroid Function Tests: No results for input(s): TSH, T4TOTAL, FREET4, T3FREE, THYROIDAB in the last 72 hours. Anemia Panel: No results for input(s): VITAMINB12, FOLATE, FERRITIN, TIBC, IRON, RETICCTPCT in the last 72 hours. Sepsis Labs: No results for input(s): PROCALCITON, LATICACIDVEN in the last 168 hours.  Recent Results (from the past 240  hour(s))  SARS Coronavirus 2 by RT PCR (hospital order, performed in [facility] hospital lab) Nasopharyngeal Nasopharyngeal Swab     Status: None   Collection Time: 06/22/20  4:33 PM   Specimen: Nasopharyngeal Swab  Result Value Ref Range Status   SARS Coronavirus 2 NEGATIVE NEGATIVE Final    Comment: (NOTE) SARS-CoV-2 target nucleic acids are NOT DETECTED.  The SARS-CoV-2 RNA is generally detectable in upper and lower respiratory specimens during the acute phase of infection. The lowest concentration of SARS-CoV-2 viral copies this assay can detect is 250 copies / mL. A negative result does not preclude SARS-CoV-2 infection and should not be used as the sole basis for treatment or other patient management decisions.  A negative result may occur with improper specimen collection / handling, submission of specimen other than nasopharyngeal swab, presence of viral mutation(s) within the areas targeted by this assay, and inadequate number of viral copies (<250 copies / mL). A negative result must be combined with clinical observations, patient history, and epidemiological information.  Fact Sheet for Patients:   StrictlyIdeas.no  Fact Sheet for Healthcare Providers: BankingDealers.co.za  This test is not yet approved or  cleared by the [place] FDA and has been authorized for detection and/or diagnosis of SARS-CoV-2 by FDA under an Emergency Use Authorization (EUA).  This EUA will remain in effect (meaning this test can be used) for the duration of the COVID-19 declaration under Section 564(b)(1) of the Act, 21 U.S.C. section 360bbb-3(b)(1), unless the authorization is terminated or revoked sooner.  Performed at [facility], [address], [place], [place] [address]          Radiology Studies: No results found.      Scheduled Meds: . sodium chloride   Intravenous Once  . amLODipine  5 mg Oral Daily  .  Chlorhexidine Gluconate Cloth  6 each Topical Q0600  . docusate sodium  200 mg Oral Daily  . [START ON 07/01/2020] epoetin (EPOGEN/PROCRIT) injection  10,000 Units Intravenous Q T,Th,Sa-HD  . insulin aspart  0-9 Units Subcutaneous TID WC  . levothyroxine  150 mcg Oral QAC breakfast  . melatonin  5 mg Oral QHS  . metoprolol tartrate  50 mg Oral Q8H  . predniSONE  20 mg Oral BID WC  . scopolamine  1  patch Transdermal Q72H  . sodium chloride flush  3 mL Intravenous Q12H   Continuous Infusions: . sodium chloride 250 mL (06/28/20 0938)    Assessment & Plan:   Principal Problem:   Hyperkalemia Active Problems:   Diabetes mellitus (HCC)   Hypertension   S/P CABG x 4   Cerebral embolism with cerebral infarction (HCC)   CAD (coronary artery disease)   Thyroid disease   HTN, goal below 140/80   CKD (chronic kidney disease) stage 5, GFR less than 15 ml/min (HCC)   Carotid stenosis   Prolonged QT interval   AKI on CKDIIIb: currently stage V, w/ hematuria. 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  Immunosuppression tx not beneficial given adv. Chronic changes in kidneys. Pt agreed to steroid tx  ACD- Hg 6.3. spoke to pt about transfusion of 1 unit prbc today, initially agreed but later declined Nephrology will increase dose of Epogen with next HD tomorrow    Hyperkalemia: K levels stable. Continue to monitor   Thrombocytopenia: resolved, continue with monitoring   CAD: s/p CABG.  Continue with metoprolol plavix and asa on hold.    Hx of CVA: recent July 2021 w/ residual right arm weakness. Asa and plavix held per nephrolgoy    Carotid stenosis: recent IR procedure showed 30-40% left ICA stenosis. No acute intervention needed . Continue with outpt monitoring   DM2:  BG stable. continue to hold home dose of glimepiride. Will  continue on SSI w/ accuchecks   Chronic pain: continue on norco, morphine   Anxiety: severity unknown. 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 Continue to hold home dose of lasix secondary CKD. BP is WNL currently    DVT prophylaxis: scd Code Status:full Family Communication: husband at bedside  Status is: Inpatient  Remains inpatient appropriate because:Ongoing diagnostic testing needed not appropriate for outpatient work up   Dispo: The patient is from: Home              Anticipated d/c is to: SNF              Anticipated d/c date is: 2 days              Patient currently is not medically stable to d/c.            LOS: 8 days   Time spent: 35 min with >50% on coc    Nolberto Hanlon, MD [facility] Pager [phone]  If 7PM-7AM, please contact night-coverage [email or web address] Password [facility] 06/30/2020, 9:41 PM

## 2020-06-30 NOTE — Progress Notes (Signed)
PT Cancellation Note  Patient Details Name: [name] MRN: [id number] DOB: 1934/07/23   Cancelled Treatment:    Reason Eval/Treat Not Completed: Other (comment) (Per chart review, pt Hgb was 6.3 at 6:11 this AM. Per hospital guidelines, therapy is contraindicated with Hgb < 7.0. Spoke with RN and there are no orders for blood transfusion at this time. Will hold therapy evaluation until pt is medically stable.)  Herminio Commons, PT, DPT 9:12 AM,06/30/20

## 2020-06-30 NOTE — Progress Notes (Signed)
New start hemodialysis patient, clinically accepted at [facility] 11:00am. Patient can start on Saturday 10/2 at 10:30. Patient and husband are aware, plan is for husband to transport. Please contact me with any other dialysis placement concerns.  Elvera Bicker Dialysis Coordinator [phone]

## 2020-07-01 ENCOUNTER — Encounter: Payer: Self-pay | Admitting: Obstetrics and Gynecology

## 2020-07-01 DIAGNOSIS — D649 Anemia, unspecified: Secondary | ICD-10-CM

## 2020-07-01 LAB — GLUCOSE, CAPILLARY
Glucose-Capillary: 166 mg/dL — ABNORMAL HIGH (ref 70–99)
Glucose-Capillary: 142 mg/dL — ABNORMAL HIGH (ref 70–99)
Glucose-Capillary: 108 mg/dL — ABNORMAL HIGH (ref 70–99)

## 2020-07-01 LAB — CBC
HCT: 19.4 % — ABNORMAL LOW (ref 36.0–46.0)
Hemoglobin: 6.4 g/dL — ABNORMAL LOW (ref 12.0–15.0)
MCH: 29.4 pg (ref 26.0–34.0)
MCHC: 33 g/dL (ref 30.0–36.0)
MCV: 89 fL (ref 80.0–100.0)
Platelets: 199 10*3/uL (ref 150–400)
RBC: 2.18 MIL/uL — ABNORMAL LOW (ref 3.87–5.11)
RDW: 15.4 % (ref 11.5–15.5)
WBC: 5.5 10*3/uL (ref 4.0–10.5)
nRBC: 0 % (ref 0.0–0.2)

## 2020-07-01 MED ORDER — SODIUM CHLORIDE 0.9% IV SOLUTION: Freq: Once | INTRAVENOUS | Status: AC

## 2020-07-01 NOTE — Progress Notes (Signed)
PT Cancellation Note  Patient Details Name: [name] MRN: [id number] DOB: 1934-01-01   Cancelled Treatment:    Reason Eval/Treat Not Completed: Other (comment) Pt continues to have low Hgb levels, apparently she did not wish to have transfusion yesterday, but numbers as now below 7 again (6.4) and apparently she is willing to have transfusion.  Hopefully she will get this and have better and more appropriate for activity lab values tomorrow.  Will maintain on caseload and continue to follow/attempt to see as appropriate.  Kreg Shropshire, DPT 07/01/2020, 3:51 PM

## 2020-07-01 NOTE — Progress Notes (Signed)
PROGRESS NOTE    [name]  DJM:[id number] DOB: 1934-04-23 DOA: 06/22/2020 PCP: Idelle Crouch, MD    Brief Narrative:   Pt presented with worsening renal function. Started on HD.    Consultants:   Vascular and nephrology  Procedures:   Antimicrobials:       Subjective: Has no complaints.. seen in HD, tolerating HD today.    Objective: Vitals:   06/30/20 2128 06/30/20 2209 07/01/20 0518 07/01/20 0753  BP: 125/62 (!) 147/87 133/60 (!) 134/44  Pulse: 81 78 76 71  Resp:  17 18 16   Temp:  97.8 F (36.6 C)  97.7 F (36.5 C)  TempSrc:  Oral  Oral  SpO2:  95% 94% 96%  Weight:      Height:        Intake/Output Summary (Last 24 hours) at 07/01/2020 0847 Last data filed at 06/30/2020 1856 Gross per 24 hour  Intake 600 ml  Output --  Net 600 ml   Filed Weights   06/25/20 1400  Weight: 93.9 kg    Examination:  General exam: Appears calm and comfortable  Respiratory system: Clear to auscultation. Respiratory effort normal. Cardiovascular system: S1 & S2 heard, RRR. No JVD, murmurs, rubs, gallops or clicks.  Gastrointestinal system: Abdomen is nondistended, soft and nontender.  Normal bowel sounds heard. Central nervous system: Alert and oriented. No focal neurological deficits. Extremities:no edema Skin: warm, dry Psychiatry: Judgement and insight appear normal. Mood & affect appropriate.     Data Reviewed: I have personally reviewed following labs and imaging studies  CBC: Recent Labs  Lab 06/26/20 0719 06/26/20 0719 06/27/20 0316 06/28/20 0504 06/29/20 0416 06/30/20 0611 06/30/20 1319  WBC 10.8*  --  14.2* 9.5 8.3 6.5  --   HGB 8.5*   < > 8.8* 8.3* 7.6* 6.3* 7.0*  HCT 25.8*  --  27.5* 24.9* 22.7* 19.0*  --   MCV 88.4  --  90.2 88.0 88.0 87.6  --   PLT 203  --  180 166 190 180  --    < > = values in this interval not displayed.   Basic Metabolic Panel: Recent Labs  Lab 06/25/20 0435 06/25/20 0435 06/26/20 0719 06/27/20 0316  06/28/20 0504 06/29/20 0416 06/30/20 0611  NA 137  137   < > 138 139 137 133* 134*  K 4.8  4.8   < > 4.7 4.6 4.4 4.9 4.6  CL 101  102   < > 103 103 100 98 98  CO2 24  23   < > 26 27 26 23 25   GLUCOSE 136*  136*   < > 135* 137* 117* 136* 104*  BUN 84*  85*   < > 69* 48* 67* 88* 52*  CREATININE 6.09*  6.15*   < > 5.63* 5.27* 7.43* 8.94* 7.11*  CALCIUM 7.4*  7.4*   < > 7.2* 7.4* 7.4* 7.2* 7.4*  PHOS 6.1*  --   --   --  6.8*  --   --    < > = values in this interval not displayed.   GFR: Estimated Creatinine Clearance: 6.7 mL/min (A) (by C-G formula based on SCr of 7.11 mg/dL (H)). Liver Function Tests: Recent Labs  Lab 06/25/20 0435  ALBUMIN 2.9*   No results for input(s): LIPASE, AMYLASE in the last 168 hours. No results for input(s): AMMONIA in the last 168 hours. Coagulation Profile: Recent Labs  Lab 06/27/20 1128  INR 1.0   Cardiac Enzymes: No results  for input(s): CKTOTAL, CKMB, CKMBINDEX, TROPONINI in the last 168 hours. BNP (last 3 results) No results for input(s): PROBNP in the last 8760 hours. HbA1C: No results for input(s): HGBA1C in the last 72 hours. CBG: Recent Labs  Lab 06/30/20 0818 06/30/20 1217 06/30/20 1624 06/30/20 2242 07/01/20 0838  GLUCAP 101* 94 94 109* 166*   Lipid Profile: No results for input(s): CHOL, HDL, LDLCALC, TRIG, CHOLHDL, LDLDIRECT in the last 72 hours. Thyroid Function Tests: No results for input(s): TSH, T4TOTAL, FREET4, T3FREE, THYROIDAB in the last 72 hours. Anemia Panel: No results for input(s): VITAMINB12, FOLATE, FERRITIN, TIBC, IRON, RETICCTPCT in the last 72 hours. Sepsis Labs: No results for input(s): PROCALCITON, LATICACIDVEN in the last 168 hours.  Recent Results (from the past 240 hour(s))  SARS Coronavirus 2 by RT PCR (hospital order, performed in [facility] hospital lab) Nasopharyngeal Nasopharyngeal Swab     Status: None   Collection Time: 06/22/20  4:33 PM   Specimen: Nasopharyngeal Swab  Result  Value Ref Range Status   SARS Coronavirus 2 NEGATIVE NEGATIVE Final    Comment: (NOTE) SARS-CoV-2 target nucleic acids are NOT DETECTED.  The SARS-CoV-2 RNA is generally detectable in upper and lower respiratory specimens during the acute phase of infection. The lowest concentration of SARS-CoV-2 viral copies this assay can detect is 250 copies / mL. A negative result does not preclude SARS-CoV-2 infection and should not be used as the sole basis for treatment or other patient management decisions.  A negative result may occur with improper specimen collection / handling, submission of specimen other than nasopharyngeal swab, presence of viral mutation(s) within the areas targeted by this assay, and inadequate number of viral copies (<250 copies / mL). A negative result must be combined with clinical observations, patient history, and epidemiological information.  Fact Sheet for Patients:   StrictlyIdeas.no  Fact Sheet for Healthcare Providers: BankingDealers.co.za  This test is not yet approved or  cleared by the [place] FDA and has been authorized for detection and/or diagnosis of SARS-CoV-2 by FDA under an Emergency Use Authorization (EUA).  This EUA will remain in effect (meaning this test can be used) for the duration of the COVID-19 declaration under Section 564(b)(1) of the Act, 21 U.S.C. section 360bbb-3(b)(1), unless the authorization is terminated or revoked sooner.  Performed at [facility], [address], [place], [place] [address]          Radiology Studies: No results found.      Scheduled Meds: . sodium chloride   Intravenous Once  . amLODipine  5 mg Oral Daily  . Chlorhexidine Gluconate Cloth  6 each Topical Q0600  . docusate sodium  200 mg Oral Daily  . epoetin (EPOGEN/PROCRIT) injection  10,000 Units Intravenous Q T,Th,Sa-HD  . insulin aspart  0-9 Units Subcutaneous TID WC  .  levothyroxine  150 mcg Oral QAC breakfast  . melatonin  5 mg Oral QHS  . metoprolol tartrate  50 mg Oral Q8H  . predniSONE  20 mg Oral BID WC  . scopolamine  1 patch Transdermal Q72H  . sodium chloride flush  3 mL Intravenous Q12H   Continuous Infusions: . sodium chloride 250 mL (06/28/20 0938)    Assessment & Plan:   Principal Problem:   Hyperkalemia Active Problems:   Diabetes mellitus (HCC)   Hypertension   S/P CABG x 4   Cerebral embolism with cerebral infarction [place])   CAD (coronary artery disease)   Thyroid disease   HTN, goal  below 140/80   CKD (chronic kidney disease) stage 5, GFR less than 15 ml/min (HCC)   Carotid stenosis   Prolonged QT interval   AKI on CKDIIIb: currently stage V, w/ hematuria. 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  Immunosuppression tx not beneficial given adv. Chronic changes in kidneys. Pt agreed to steroid tx 9/30- On HD today.  Getting epogen Got HD spot however will need PT evaluation which has not completed due to pt's low Hg prior to dc to see if needs [facility] or SNF.  ACD- Hg 6.4.pt refused transfusion yesterday but has agreed to transfusion today. Will ck occult blood stool consulted hematology -input appreciated, thought likely 2/2 ESRD/iron deficiency.  Rec. Iv iron infusion Rec. W/u for hemolysis. On higher dose of epogen Will transfuse 1 unit prbc over 4 hours    Hyperkalemia: Stable now.  Continue to monitor   Thrombocytopenia:  Resolved, continue to monitor   CAD: s/p CABG.  Continue with metoprolol Plavix and asa on hold per nephrology.    Hx of CVA: recent July 2021 w/ residual right arm weakness.  Asa and plavix on hold per nehprology     Carotid stenosis: recent IR procedure showed 30-40% left ICA stenosis. No acute intervention needed . Continue with outpt  monitoring    DM2:  BG stable. continue to hold home dose of glimepiride. Will continue on SSI w/ accuchecks   Chronic pain:continue on norco, morphine  Anxiety:severity unknown. 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 Continue to hold home dose of lasix secondary CKD. BP is WNL currently      DVT prophylaxis: scd Code Status:full Family Communication: none at bedside, seen in HD  Status is: Inpatient  Remains inpatient appropriate because:Ongoing diagnostic testing needed not appropriate for outpatient work up   Dispo: The patient is from: Home              Anticipated d/c is to: home vs snf. PT evaluation pending              Anticipated d/c date is: 1 day              Patient currently is not medically stable to d/c.            LOS: 9 days   Time spent: 35 min with >50% on coc    Nolberto Hanlon, MD [facility] Pager [phone]  If 7PM-7AM, please contact night-coverage [email or web address] Password [facility] 07/01/2020, 8:47 AM

## 2020-07-01 NOTE — Care Management Important Message (Signed)
Important Message  Patient Details  Name: [name] MRN: [id number] Date of Birth: [birth year]   Medicare Important Message Given:  Yes     [name] 07/01/2020, 1:32 PM

## 2020-07-01 NOTE — Plan of Care (Signed)

## 2020-07-01 NOTE — Assessment & Plan Note (Addendum)
#  85 year old female patient with multiple medical problems including glomerulonephritis/end-stage renal disease is currently admitted to hospital for worsening renal function/hyperkalemia; noted to have worsening anemia  #Symptomatic anemia-nadir hemoglobin 6-7; normocytic-likely secondary to end-stage renal disease/iron deficiency; s/p PRBC transfusion 7.5.  No evidence of hemolysis or active GI bleed.  Recommend IV iron Venofer.   #Glomerular nephritis -end-stage renal disease/dialysis  #Coronary artery disease status post CABG   #Discussed with Dr. Benjaman Lobe Procrit/IV iron during dialysis.  Patient follow-up with me only as needed.  Discussed with Dr.Amery.

## 2020-07-01 NOTE — Progress Notes (Signed)
[place], [place] 07/01/20  Subjective:   Hospital day # 9.   Renal: 09/29 0701 - 09/30 0700 In: 600 [P.O.:600] Out: -  Lab Results  Component Value Date   CREATININE 7.11 (H) 06/30/2020   CREATININE 8.94 (H) 06/29/2020   CREATININE 7.43 (H) 06/28/2020   Patient lying in bed, receiving dialysis treatment, tolerating well. No c/o of SOB,nausea, vomiting or dizziness,SpO2 92% on room air.Hgb 6.4 today, refused blood transfusion yesterday.  Objective:  Vital signs in last 24 hours:  Temp:  [97.7 F (36.5 C)-98.5 F (36.9 C)] 97.7 F (36.5 C) (09/30 0753) Pulse Rate:  [71-90] 71 (09/30 0753) Resp:  [16-18] 16 (09/30 0753) BP: (125-147)/(44-87) 134/44 (09/30 0753) SpO2:  [94 %-98 %] 96 % (09/30 0753)  Weight change:  Filed Weights   06/25/20 1400  Weight: 93.9 kg    Intake/Output:    Intake/Output Summary (Last 24 hours) at 07/01/2020 1052 Last data filed at 06/30/2020 1856 Gross per 24 hour  Intake 360 ml  Output --  Net 360 ml   Physical Exam: General:  Appears pale, Receiving dialysis treatment,tolerating well  HEENT  Normocephalic,Atraumatic  Pulm/lungs  Lungs clear, diminished at the bases,respiration unlabored  CVS/Heart  Regular,HR 75/mt on the cardiac monitor  Abdomen:   Soft, non tender,  Extremities:  No peripheral edema  Neurologic:  Alert, oriented,speech appropriate and clear  Skin:  No acute lesions or rashes  Right IJ PermCath    Basic Metabolic Panel:  Recent Labs  Lab 06/25/20 0435 06/25/20 0435 06/26/20 0719 06/26/20 0719 06/27/20 0316 06/27/20 0316 06/28/20 0504 06/29/20 0416 06/30/20 0611  NA 137  137   < > 138  --  139  --  137 133* 134*  K 4.8  4.8   < > 4.7  --  4.6  --  4.4 4.9 4.6  CL 101  102   < > 103  --  103  --  100 98 98  CO2 24  23   < > 26  --  27  --  26 23 25   GLUCOSE 136*  136*   < > 135*  --  137*  --  117* 136* 104*  BUN 84*  85*   < > 69*  --  48*  --  67* 88* 52*   CREATININE 6.09*  6.15*   < > 5.63*  --  5.27*  --  7.43* 8.94* 7.11*  CALCIUM 7.4*  7.4*   < > 7.2*   < > 7.4*   < > 7.4* 7.2* 7.4*  PHOS 6.1*  --   --   --   --   --  6.8*  --   --    < > = values in this interval not displayed.     CBC: Recent Labs  Lab 06/27/20 0316 06/27/20 0316 06/28/20 0504 06/29/20 0416 06/30/20 0611 06/30/20 1319 07/01/20 0912  WBC 14.2*  --  9.5 8.3 6.5  --  5.5  HGB 8.8*   < > 8.3* 7.6* 6.3* 7.0* 6.4*  HCT 27.5*  --  24.9* 22.7* 19.0*  --  19.4*  MCV 90.2  --  88.0 88.0 87.6  --  89.0  PLT 180  --  166 190 180  --  199   < > = values in this interval not displayed.      Lab Results  Component Value Date   HEPBSAG NON REACTIVE 06/23/2020   HEPBSAB NON REACTIVE 06/23/2020  HEPBIGM NON REACTIVE 06/23/2020      Microbiology:  Recent Results (from the past 240 hour(s))  SARS Coronavirus 2 by RT PCR (hospital order, performed in [facility] hospital lab) Nasopharyngeal Nasopharyngeal Swab     Status: None   Collection Time: 06/22/20  4:33 PM   Specimen: Nasopharyngeal Swab  Result Value Ref Range Status   SARS Coronavirus 2 NEGATIVE NEGATIVE Final    Comment: (NOTE) SARS-CoV-2 target nucleic acids are NOT DETECTED.  The SARS-CoV-2 RNA is generally detectable in upper and lower respiratory specimens during the acute phase of infection. The lowest concentration of SARS-CoV-2 viral copies this assay can detect is 250 copies / mL. A negative result does not preclude SARS-CoV-2 infection and should not be used as the sole basis for treatment or other patient management decisions.  A negative result may occur with improper specimen collection / handling, submission of specimen other than nasopharyngeal swab, presence of viral mutation(s) within the areas targeted by this assay, and inadequate number of viral copies (<250 copies / mL). A negative result must be combined with clinical observations, patient history, and epidemiological  information.  Fact Sheet for Patients:   StrictlyIdeas.no  Fact Sheet for Healthcare Providers: BankingDealers.co.za  This test is not yet approved or  cleared by the [place] FDA and has been authorized for detection and/or diagnosis of SARS-CoV-2 by FDA under an Emergency Use Authorization (EUA).  This EUA will remain in effect (meaning this test can be used) for the duration of the COVID-19 declaration under Section 564(b)(1) of the Act, 21 U.S.C. section 360bbb-3(b)(1), unless the authorization is terminated or revoked sooner.  Performed at [facility], [address] [place], [place] [address]     Coagulation Studies: No results for input(s): LABPROT, INR in the last 72 hours.  Urinalysis: No results for input(s): COLORURINE, LABSPEC, PHURINE, GLUCOSEU, HGBUR, BILIRUBINUR, KETONESUR, PROTEINUR, UROBILINOGEN, NITRITE, LEUKOCYTESUR in the last 72 hours.  Invalid input(s): APPERANCEUR    Imaging: No results found.   Medications:   . sodium chloride 250 mL (06/28/20 0938)   . sodium chloride   Intravenous Once  . amLODipine  5 mg Oral Daily  . Chlorhexidine Gluconate Cloth  6 each Topical Q0600  . docusate sodium  200 mg Oral Daily  . epoetin (EPOGEN/PROCRIT) injection  10,000 Units Intravenous Q T,Th,Sa-HD  . insulin aspart  0-9 Units Subcutaneous TID WC  . levothyroxine  150 mcg Oral QAC breakfast  . melatonin  5 mg Oral QHS  . metoprolol tartrate  50 mg Oral Q8H  . predniSONE  20 mg Oral BID WC  . scopolamine  1 patch Transdermal Q72H  . sodium chloride flush  3 mL Intravenous Q12H   sodium chloride, ALPRAZolam, HYDROcodone-acetaminophen, morphine injection, ondansetron (ZOFRAN) IV, ondansetron, polyvinyl alcohol, sodium chloride, sodium chloride flush  Assessment/ Plan:  86 y.o. 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 Treated with high-dose steroids but then lost to follow-up Currently requiring dialysis. First session June 25, 2020 Review of outpatient records show that creatinine was baseline in August 2019 at 1.35, GFR 36 Creatinine trend has  been increasing since April 23, 2020 Creatinine today is 7.11  Receiving dialysis treatment today Outpatient dialysis arranged at [facility] TTS 11:00am  Repeat biopsy done on September 27th. 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  Severe interstitial fibrosis is noted along with severe tubular injury.  Advanced chronicity and mild to moderate activity is noted. Stay off hydralazine Given patient's age and comorbidities, and weighing risk and benefits, immunosuppression treatment may not be beneficial given advanced chronic changes in the kidneys Started on steroids Prednisone 20 mg PO BID  Serologies from September 22 show ANA positive,p-ANCA positive, titer 1: 640, low C3, elevated kappa to lambda ratio of 34.6  # Hypertension BP readings within acceptable range, on Amlodipine and Metoprolol.  # Anemia of kidney disease Hgb today is 6.4,refused blood transfusion yesterday, will reinforce education again today  Epogen increased to 10,000 units with dialysis TTS,starting today       LOS: Roy 9/30/202110:52 AM  [facility], [place]

## 2020-07-01 NOTE — Consult Note (Signed)
[place] CONSULT NOTE  Patient Care Team: Idelle Crouch, MD as PCP - General (Internal Medicine) Minna Merritts, MD as Attending Physician (Cardiology) Lloyd Huger, MD as Referring Physician (Internal Medicine)  CHIEF COMPLAINTS/PURPOSE OF CONSULTATION: Severe anemia  HISTORY OF PRESENTING ILLNESS:  [name] 86 y.o.  female history of multiple medical problems including coronary artery disease glomerulonephritis/chronic kidney disease-is current admitted hospital for worsening renal function/hyperkalemia.  Patient previously had been treated with steroids for glomerulonephritis-question related to hydralazine.  She had repeat kidney biopsy this admission.  Patient is currently started on emergency dialysis.  She is noted to have worsening anemia nadir 6-7.  Patient denies any blood in stools or black-colored stools.  Denies any blood in urine.  Denies any vaginal bleeding.  Positive for easy bruising.  Patient is currently receiving Procrit 10,000 units 3 times a week.  She is not on iron.  Review of systems unable to perform: Patient a poor historian.  She is accompanied by husband-is hard of hearing. Review of Systems  Unable to perform ROS: Other     MEDICAL HISTORY:  Past Medical History:  Diagnosis Date   Angina pectoris ([place]) 08/26/2012   Arthritis    Breast cancer ([place]) 2012   right breast   Breast mass, right    Cancer ([place])    breast cancer, right side 2012   Chronic kidney disease    RENAL INSUFF   Complication of anesthesia    Coronary artery disease 08/22/2012   sees Dr Rockey Situ   Diabetes [facility])    Dyspnea    ON EXERTION   Gastritis    hx of   GERD (gastroesophageal reflux disease)    Headache(784.0)    migraines   History of breast cancer    39 treatments of radiation. Negative chemo.   History of seasonal allergies    Hyperlipemia    Hypertension    sees Dr. 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   LIMA to LAD, SVG to Diag, SVG to OM1, SVG to PDA, EVH from bilateral thighs   Stroke [facility])    2012   Thyroid disease    Vertigo     SURGICAL HISTORY: Past Surgical History:  Procedure Laterality Date   ABDOMINAL HYSTERECTOMY     APPENDECTOMY     BACK SURGERY     BREAST BIOPSY Right    2012 positive- [facility]   BREAST LUMPECTOMY Right 2012   F/U radiation    BREAST SURGERY     CARDIAC CATHETERIZATION  2014   CAROTID PTA/STENT INTERVENTION Left 06/14/2020   Procedure: CAROTID PTA/STENT INTERVENTION;  Surgeon: Algernon Huxley, MD;  Location: [place] CV LAB;  Service: Cardiovascular;  Laterality: Left;   CATARACT EXTRACTION W/PHACO Right 08/14/2017   Procedure: CATARACT EXTRACTION PHACO AND INTRAOCULAR LENS PLACEMENT (IOC);  Surgeon: Birder Robson, MD;  Location: [facility] ORS;  Service: Ophthalmology;  Laterality: Right;  [place] 00:43.0 AP% 17.1 CDE 7.37 Fluid Pack lot # [id number]   CATARACT EXTRACTION W/PHACO Left 10/09/2017   Procedure: CATARACT EXTRACTION PHACO AND INTRAOCULAR LENS PLACEMENT (IOC);  Surgeon: Birder Robson, MD;  Location: [facility] ORS;  Service: Ophthalmology;  Laterality: Left;  [place] 00:30.3 AP% 11.0 CDE 3.33 Fluid Pack Lot # [id number]   CORONARY ARTERY BYPASS GRAFT  09/05/2012   Procedure: CORONARY ARTERY BYPASS GRAFTING (CABG);  Surgeon: Rexene Alberts, MD;  Location: [place];  Service: Open Heart Surgery;  Laterality: N/A;  CABG x four, using left internal mammary artery and bilateral greater saphenous vein harvested endoscopically   DIALYSIS/PERMA CATHETER INSERTION Left 06/25/2020   Procedure: DIALYSIS/PERMA CATHETER INSERTION;  Surgeon: Algernon Huxley, MD;  Location: [place]  CV LAB;  Service: Cardiovascular;  Laterality: Left;   INTRAOPERATIVE TRANSESOPHAGEAL ECHOCARDIOGRAM  09/05/2012   Procedure: INTRAOPERATIVE TRANSESOPHAGEAL ECHOCARDIOGRAM;  Surgeon: Rexene Alberts, MD;  Location: [place];  Service: Open Heart Surgery;  Laterality: N/A;   KNEE SURGERY     Bilateral nerve block   LAPAROSCOPIC NISSEN FUNDOPLICATION     OVARIAN CYST REMOVAL      SOCIAL HISTORY: Social History   Socioeconomic History   Marital status: Married    Spouse name: [name] "Bud"   Number of children: 0   Years of education: Not on file   Highest education level: Not on file  Occupational History   Occupation: retired  Tobacco Use   Smoking status: Former Smoker    Types: Cigarettes    Quit date: 11/13/1988    Years since quitting: 31.6   Smokeless tobacco: Never Used  Substance and Sexual Activity   Alcohol use: No   Drug use: No   Sexual activity: Not on file  Other Topics Concern   Not on file  Social History Narrative   Lives at home with spouse    Social Determinants of Health   Financial Resource Strain:    Difficulty of Paying Living Expenses: Not on file  Food Insecurity:    Worried About Charity fundraiser in the Last Year: Not on file   [employer] of Food in the Last Year: Not on file  Transportation Needs:    Lack of Transportation (Medical): Not on file   Lack of Transportation (Non-Medical): Not on file  Physical Activity:    Days of Exercise per Week: Not on file   Minutes of Exercise per Session: Not on file  Stress:    Feeling of Stress : Not on file  Social Connections:    Frequency of Communication with Friends and Family: Not on file   Frequency of Social Gatherings with Friends and Family: Not on file   Attends Religious Services: Not on file   Active Member of Clubs or Organizations: Not on file   Attends Archivist Meetings: Not on file   Marital Status: Not on file  Intimate Partner Violence:     Fear of Current or Ex-Partner: Not on file   Emotionally Abused: Not on file   Physically Abused: Not on file   Sexually Abused: Not on file    FAMILY HISTORY: Family History  Problem Relation Age of Onset   Heart disease Brother        CABG & stents   Hyperlipidemia Brother    Hypertension Brother    Cancer Father    Heart disease Mother    Breast cancer Cousin    Kidney disease Neg Hx     ALLERGIES:  is allergic to clonidine, darvocet [propoxyphene n-acetaminophen], duratuss g [guaifenesin], ketek  [telithromycin], ciprocinonide [fluocinolone], ivp dye [iodinated diagnostic agents], levaquin [levofloxacin], and statins.  MEDICATIONS:  Current Facility-Administered Medications  Medication  Dose Route Frequency Provider Last Rate Last Admin   0.9 %  sodium chloride infusion (Manually program via Guardrails IV Fluids)   Intravenous Once Nolberto Hanlon, MD       0.9 %  sodium chloride infusion  250 mL Intravenous PRN Algernon Huxley, MD 10 mL/hr at 06/28/20 0938 250 mL at 06/28/20 5176   ALPRAZolam (XANAX) tablet 0.5 mg  0.5 mg Oral Daily PRN Algernon Huxley, MD   0.5 mg at 06/27/20 2247   amLODipine (NORVASC) tablet 5 mg  5 mg Oral Daily Bhutani, Manpreet S, MD   5 mg at 06/30/20 1050   Chlorhexidine Gluconate Cloth 2 % PADS 6 each  6 each Topical Q0600 Algernon Huxley, MD   6 each at 07/01/20 0519   docusate sodium (COLACE) capsule 200 mg  200 mg Oral Daily Algernon Huxley, MD   200 mg at 06/30/20 1050   epoetin alfa (EPOGEN) injection 10,000 Units  10,000 Units Intravenous Q T,Th,Sa-HD Murlean Iba, MD       HYDROcodone-acetaminophen (NORCO) 7.5-325 MG per tablet 1 tablet  1 tablet Oral Q6H PRN Algernon Huxley, MD   1 tablet at 07/01/20 1152   insulin aspart (novoLOG) injection 0-9 Units  0-9 Units Subcutaneous TID WC Algernon Huxley, MD   2 Units at 07/01/20 1607   levothyroxine (SYNTHROID) tablet 150 mcg  150 mcg Oral QAC breakfast Algernon Huxley, MD   150 mcg at 07/01/20 0519    melatonin tablet 5 mg  5 mg Oral QHS Algernon Huxley, MD   5 mg at 06/30/20 2130   metoprolol tartrate (LOPRESSOR) tablet 50 mg  50 mg Oral Q8H Algernon Huxley, MD   50 mg at 07/01/20 0519   morphine 2 MG/ML injection 1 mg  1 mg Intravenous Q4H PRN Algernon Huxley, MD       ondansetron [facility]) injection 4 mg  4 mg Intravenous Q6H PRN Algernon Huxley, MD   4 mg at 06/30/20 1215   ondansetron (ZOFRAN-ODT) disintegrating tablet 8 mg  8 mg Oral Q8H PRN Algernon Huxley, MD   8 mg at 06/25/20 0419   polyvinyl alcohol (LIQUIFILM TEARS) 1.4 % ophthalmic solution 1 drop  1 drop Both Eyes PRN Algernon Huxley, MD   1 drop at 06/28/20 1400   predniSONE (DELTASONE) tablet 20 mg  20 mg Oral BID WC Murlean Iba, MD   20 mg at 06/30/20 1830   scopolamine (TRANSDERM-SCOP) 1 MG/3DAYS 1.5 mg  1 patch Transdermal Q72H Algernon Huxley, MD   1.5 mg at 07/01/20 0919   sodium chloride (OCEAN) 0.65 % nasal spray 1 spray  1 spray Each Nare PRN Algernon Huxley, MD       sodium chloride flush (NS) 0.9 % injection 3 mL  3 mL Intravenous Q12H Algernon Huxley, MD   3 mL at 07/01/20 0920   sodium chloride flush (NS) 0.9 % injection 3 mL  3 mL Intravenous PRN Algernon Huxley, MD   3 mL at 06/25/20 2344      .  PHYSICAL EXAMINATION:  Vitals:   07/01/20 1600 07/01/20 1619  BP: 122/60 (!) 128/50  Pulse: 85 84  Resp: 16 20  Temp: 98.2 F (36.8 C) 98.5 F (36.9 C)  SpO2:  91%   Filed Weights   06/25/20 1400  Weight: 207 lb 0.2 oz (93.9 kg)    Physical Exam HENT:     Head: Normocephalic and  atraumatic.     Mouth/Throat:     Pharynx: No oropharyngeal exudate.  Eyes:     Pupils: Pupils are equal, round, and reactive to light.  Cardiovascular:     Rate and Rhythm: Normal rate and regular rhythm.  Pulmonary:     Effort: No respiratory distress.     Breath sounds: No wheezing.     Comments: Decreased breath sounds. Abdominal:     General: Bowel sounds are normal. There is no distension.     Palpations: Abdomen is soft. There is  no mass.     Tenderness: There is no abdominal tenderness. There is no guarding or rebound.  Musculoskeletal:        General: No tenderness. Normal range of motion.     Cervical back: Normal range of motion and neck supple.  Skin:    General: Skin is warm.     Comments: Multiple bruises noted.  Permacath in place.  Neurological:     Mental Status: She is alert and oriented to person, place, and time.  Psychiatric:        Mood and Affect: Affect normal.      LABORATORY DATA:  I have reviewed the data as listed Lab Results  Component Value Date   WBC 5.5 07/01/2020   HGB 6.4 (L) 07/01/2020   HCT 19.4 (L) 07/01/2020   MCV 89.0 07/01/2020   PLT 199 07/01/2020   Recent Labs    04/23/20 1438 06/14/20 0942 06/22/20 1412 06/22/20 2326 06/24/20 0740 06/24/20 0740 06/25/20 0435 06/26/20 0719 06/28/20 0504 06/29/20 0416 06/30/20 0611  NA 137  --  135   < > 132*   133*   < > 137   137   < > 137 133* 134*  K 4.2  --  5.6*   < > 5.5*   5.5*   < > 4.8   4.8   < > 4.4 4.9 4.6  CL 102  --  100   < > 99   99   < > 101   102   < > 100 98 98  CO2 23  --  25   < > 23   23   < > 24   23   < > 26 23 25   GLUCOSE 132*  --  161*   < > 207*   209*   < > 136*   136*   < > 117* 136* 104*  BUN 45*   < > 64*   < > 70*   73*   < > 84*   85*   < > 67* 88* 52*  CREATININE 3.40*   < > 5.15*   < > 5.66*   5.72*   < > 6.09*   6.15*   < > 7.43* 8.94* 7.11*  CALCIUM 8.3*  --  7.6*   < > 7.9*   7.9*   < > 7.4*   7.4*   < > 7.4* 7.2* 7.4*  GFRNONAA 12*   < > 7*   < > 6*   6*   < > 6*   6*   < > 5* 4* 5*  GFRAA 14*   < > 8*   < > 7*   7*   < > 7*   7*   < > 5* 4* 6*  PROT 8.5*  --  7.9  --   --   --   --   --   --   --   --  ALBUMIN 3.3*  --  3.1*  --  2.9*  --  2.9*  --   --   --   --   AST 11*  --  10*  --   --   --   --   --   --   --   --   ALT 8  --  6  --   --   --   --   --   --   --   --   ALKPHOS 81  --  78  --   --   --   --   --   --   --   --   BILITOT 0.8  --  0.7  --   --   --   --   --    --   --   --    < > = values in this interval not displayed.    RADIOGRAPHIC STUDIES: I have personally reviewed the radiological images as listed and agreed with the findings in the report. DG Chest 2 View  Result Date: 06/23/2020 CLINICAL DATA:  CHF, acute kidney failure EXAM: CHEST - 2 VIEW COMPARISON:  May 04, 2016 FINDINGS: Post median sternotomy for CABG.  Stable cardiac enlargement. Mild interstitial prominence bilaterally. No lobar consolidation. Patchy opacity with linear characteristics in the LEFT lung base. No acute skeletal process on limited assessment. IMPRESSION: 1. Cardiomegaly with mild interstitial prominence which may reflect mild edema. No lobar consolidation. 2. Areas of platelike in linear opacity at the LEFT lung base likely atelectasis. Electronically Signed   By: Zetta Bills M.D.   On: 06/23/2020 11:42   US RENAL  Result Date: 06/10/2020 CLINICAL DATA:  Initial evaluation for acute renal failure. EXAM: RENAL / URINARY TRACT ULTRASOUND COMPLETE COMPARISON:  Prior ultrasound from 04/30/2016. FINDINGS: Right Kidney: Renal measurements: 11.1 x 4.7 x 4.9 cm = volume: 134.8 mL. Increased echogenicity seen within the renal parenchyma. No nephrolithiasis or hydronephrosis. No focal renal mass. Left Kidney: Renal measurements: 11.5 x 5.0 x 5.1 cm = volume: 152.3 mL. Mildly increased echogenicity within the renal parenchyma. No nephrolithiasis or hydronephrosis. No focal renal mass. Bladder: Appears normal for degree of bladder distention. Other: 1.7 cm splenule noted. IMPRESSION: 1. Mildly increased echogenicity within the renal parenchyma, suggesting medical renal disease. 2. No hydronephrosis or other acute abnormality. Electronically Signed   By: Jeannine Boga M.D.   On: 06/10/2020 21:34   PERIPHERAL VASCULAR CATHETERIZATION  Result Date: 06/25/2020 See op note  PERIPHERAL VASCULAR CATHETERIZATION  Result Date: 06/14/2020 See op note  ECHOCARDIOGRAM  COMPLETE  Result Date: 06/24/2020    ECHOCARDIOGRAM REPORT   Patient Name:   KEWANA SANON Date of Exam: 06/23/2020 Medical Rec #:  [id number]         Height:       64.0 in Accession #:    [id number]        Weight:       207.0 lb Date of Birth:  28-Feb-1934         BSA:          1.985 m Patient Age:    34 years          BP:           178/94 mmHg Patient Gender: F                 HR:           87  bpm. Exam Location:  [facility] Procedure: 2D Echo, Cardiac Doppler and Color Doppler Indications:     H67.59 Acute Diastolic CHF  History:         Patient has prior history of Echocardiogram examinations, most                  recent 07/17/2015. Prior CABG; Risk Factors:Hypertension,                  Dyslipidemia and Diabetes. Thyroid disease. Pneumonia. History                  of breast cancer. Dyspnea. Coronary artery disease. Chronic                  kidney disease.  Sonographer:     Wilford Sports Rodgers-Jones Referring Phys:  [id number] [place] Diagnosing Phys: Kate Sable MD IMPRESSIONS  1. Left ventricular ejection fraction, by estimation, is 60 to 65%. The left ventricle has normal function. The left ventricle has no regional wall motion abnormalities. Left ventricular diastolic parameters were normal.  2. Right ventricular systolic function is normal. The right ventricular size is normal. There is normal pulmonary artery systolic pressure.  3. The mitral valve is normal in structure. No evidence of mitral valve regurgitation. No evidence of mitral stenosis.  4. The aortic valve was not well visualized. Aortic valve regurgitation is not visualized.  5. The inferior vena cava is normal in size with greater than 50% respiratory variability, suggesting right atrial pressure of 3 mmHg. FINDINGS  Left Ventricle: Left ventricular ejection fraction, by estimation, is 60 to 65%. The left ventricle has normal function. The left ventricle has no regional wall motion abnormalities. The left ventricular internal cavity size  was normal in size. There is  no left ventricular hypertrophy. Left ventricular diastolic parameters were normal. Right Ventricle: The right ventricular size is normal. No increase in right ventricular wall thickness. Right ventricular systolic function is normal. There is normal pulmonary artery systolic pressure. The tricuspid regurgitant velocity is 2.74 m/s, and  with an assumed right atrial pressure of 3 mmHg, the estimated right ventricular systolic pressure is 65.9 mmHg. Left Atrium: Left atrial size was normal in size. Right Atrium: Right atrial size was normal in size. Pericardium: There is no evidence of pericardial effusion. Mitral Valve: The mitral valve is normal in structure. No evidence of mitral valve regurgitation. No evidence of mitral valve stenosis. Tricuspid Valve: The tricuspid valve is normal in structure. Tricuspid valve regurgitation is not demonstrated. No evidence of tricuspid stenosis. Aortic Valve: The aortic valve was not well visualized. Aortic valve regurgitation is not visualized. Pulmonic Valve: The pulmonic valve was normal in structure. Pulmonic valve regurgitation is not visualized. No evidence of pulmonic stenosis. Aorta: The aortic root is normal in size and structure. Venous: The inferior vena cava is normal in size with greater than 50% respiratory variability, suggesting right atrial pressure of 3 mmHg. IAS/Shunts: No atrial level shunt detected by color flow Doppler.  LEFT VENTRICLE PLAX 2D LVIDd:         5.03 cm  Diastology LVIDs:         2.94 cm  LV e' medial:    8.38 cm/s LV PW:         0.99 cm  LV E/e' medial:  11.6 LV IVS:        0.91 cm  LV e' lateral:   9.36 cm/s LVOT diam:     2.00 cm  LV  E/e' lateral: 10.4 LV SV:         61 LV SV Index:   31 LVOT Area:     3.14 cm  RIGHT VENTRICLE             IVC RV Basal diam:  3.51 cm     IVC diam: 1.51 cm RV S prime:     15.00 cm/s TAPSE (M-mode): 2.1 cm LEFT ATRIUM             Index       RIGHT ATRIUM           Index LA diam:         4.30 cm 2.17 cm/m  RA Area:     15.90 cm LA Vol (A2C):   57.4 ml 28.91 ml/m RA Volume:   48.10 ml  24.23 ml/m LA Vol (A4C):   58.5 ml 29.47 ml/m LA Biplane Vol: 59.5 ml 29.97 ml/m  AORTIC VALVE LVOT Vmax:   79.30 cm/s LVOT Vmean:  56.600 cm/s LVOT VTI:    0.195 m  AORTA Ao Root diam: 2.60 cm Ao Asc diam:  3.20 cm MITRAL VALVE                TRICUSPID VALVE MV Area (PHT): 3.42 cm     TR Peak grad:   30.0 mmHg MV Decel Time: 222 msec     TR Vmax:        274.00 cm/s MV E velocity: 97.30 cm/s MV A velocity: 117.00 cm/s  SHUNTS MV E/A ratio:  0.83         Systemic VTI:  0.20 m                             Systemic Diam: 2.00 cm Kate Sable MD Electronically signed by Kate Sable MD Signature Date/Time: 06/24/2020/12:36:36 PM    Final    US BIOPSY (KIDNEY)  Result Date: 06/28/2020 INDICATION: Chronic kidney disease, acute kidney injury, hematuria and need for renal biopsy for further workup. EXAM: ULTRASOUND GUIDED CORE BIOPSY OF RIGHT KIDNEY MEDICATIONS: None. ANESTHESIA/SEDATION: Fentanyl 50 mcg IV; Versed 1.0 mg IV Moderate Sedation Time:  21 minutes. The patient was continuously monitored during the procedure by the interventional radiology nurse under my direct supervision. PROCEDURE: The procedure, risks, benefits, and alternatives were explained to the patient. Questions regarding the procedure were encouraged and answered. The patient understands and consents to the procedure. A time-out was performed prior to initiating the procedure. Both kidneys were examined in a prone position. The right was chosen for biopsy. The right flank region was prepped with chlorhexidine in a sterile fashion, and a sterile drape was applied covering the operative field. A sterile gown and sterile gloves were used for the procedure. Local anesthesia was provided with 1% Lidocaine. A 15 gauge trocar needle was advanced to the level of lower pole cortex of the right kidney. Under ultrasound guidance, 2  separate 16 gauge core biopsy samples were obtained. Core biopsy samples were submitted on saline soaked Telfa gauze. A slurry of Gel-Foam pledgets mixed in normal saline was then injected through the 15 gauge needle as the needle was retracted and removed. Additional ultrasound was performed. COMPLICATIONS: None immediate. FINDINGS: Both kidneys are visible by ultrasound. The right kidney was less deep in location and better visualized with slightly thicker renal cortex compared to the left. Solid core biopsy samples were obtained. IMPRESSION: Ultrasound-guided core biopsy performed at  the level of right lower pole renal cortex. Electronically Signed   By: Aletta Edouard M.D.   On: 06/28/2020 10:42    Symptomatic anemia #86 year old female patient with multiple medical problems including glomerulonephritis/end-stage renal disease is currently admitted to hospital for worsening renal function/hyperkalemia; noted to have worsening anemia  #Symptomatic anemia-nadir hemoglobin 6-7; normocytic-likely secondary to end-stage renal disease/iron deficiency.  However other causes like hemolysis/vitamin deficiency is very low.  #Glomerular nephritis -end-stage renal disease/dialysis  #Coronary artery disease status post CABG  Recommendations:  #Agree with 1 unit PRBC transfusion.  Recommend IV iron infusions-iron saturation 13%.  Continue Procrit 10,000 units 3 times a week.  Recommend work-up for hemolysis-LDH; peripheral smear haptoglobin reticulocyte count.  Thank you Dr.Amery for allowing me to participate in the care of your pleasant patient. Please do not hesitate to contact me with questions or concerns in the interim.  Discussed with Dr. Candiss Norse.  # I reviewed the blood work- with the patient in detail; also reviewed the imaging independently [as summarized above.   All questions were answered. The patient knows to call the clinic with any problems, questions or concerns.   Cammie Sickle,  MD 07/01/2020 4:52 PM

## 2020-07-02 LAB — CBC WITH DIFFERENTIAL/PLATELET
Abs Immature Granulocytes: 0.11 10*3/uL — ABNORMAL HIGH (ref 0.00–0.07)
Basophils Absolute: 0 10*3/uL (ref 0.0–0.1)
Basophils Relative: 1 %
Eosinophils Absolute: 0 10*3/uL (ref 0.0–0.5)
Eosinophils Relative: 0 %
HCT: 21.8 % — ABNORMAL LOW (ref 36.0–46.0)
Hemoglobin: 7.4 g/dL — ABNORMAL LOW (ref 12.0–15.0)
Immature Granulocytes: 2 %
Lymphocytes Relative: 9 %
Lymphs Abs: 0.5 10*3/uL — ABNORMAL LOW (ref 0.7–4.0)
MCH: 29.4 pg (ref 26.0–34.0)
MCHC: 33.9 g/dL (ref 30.0–36.0)
MCV: 86.5 fL (ref 80.0–100.0)
Monocytes Absolute: 0.5 10*3/uL (ref 0.1–1.0)
Monocytes Relative: 9 %
Neutro Abs: 4.2 10*3/uL (ref 1.7–7.7)
Neutrophils Relative %: 79 %
Platelets: 198 10*3/uL (ref 150–400)
RBC: 2.52 MIL/uL — ABNORMAL LOW (ref 3.87–5.11)
RDW: 15.5 % (ref 11.5–15.5)
WBC: 5.3 10*3/uL (ref 4.0–10.5)
nRBC: 0 % (ref 0.0–0.2)

## 2020-07-02 LAB — TECHNOLOGIST SMEAR REVIEW: Plt Morphology: NORMAL

## 2020-07-02 LAB — VITAMIN B12: Vitamin B-12: 2356 pg/mL — ABNORMAL HIGH (ref 180–914)

## 2020-07-02 LAB — FOLATE: Folate: 13.6 ng/mL (ref >5.9)

## 2020-07-02 LAB — GLUCOSE, CAPILLARY
Glucose-Capillary: 114 mg/dL — ABNORMAL HIGH (ref 70–99)
Glucose-Capillary: 174 mg/dL — ABNORMAL HIGH (ref 70–99)

## 2020-07-02 LAB — LACTATE DEHYDROGENASE: LDH: 116 U/L (ref 98–192)

## 2020-07-02 MED ORDER — OMEPRAZOLE 20 MG PO CPDR: 20.0000 mg | DELAYED_RELEASE_CAPSULE | Freq: Every day | ORAL | 0 refills | Status: DC

## 2020-07-02 MED ORDER — FAMOTIDINE 20 MG PO TABS: 20.0000 mg | ORAL_TABLET | Freq: Two times a day (BID) | ORAL | 1 refills | Status: DC

## 2020-07-02 MED ORDER — PREDNISONE 20 MG PO TABS: 20.0000 mg | ORAL_TABLET | Freq: Two times a day (BID) | ORAL | 0 refills | Status: AC

## 2020-07-02 MED ORDER — AMLODIPINE BESYLATE 5 MG PO TABS
5.0000 mg | ORAL_TABLET | Freq: Every day | ORAL | 1 refills | Status: DC
Start: 2020-07-03 — End: 2020-08-11

## 2020-07-02 MED ORDER — SODIUM CHLORIDE 0.9 % IV SOLN
200.0000 mg | INTRAVENOUS | Status: DC
  Administered 2020-07-02: 200 mg via INTRAVENOUS
  Filled 2020-07-02: qty 10

## 2020-07-02 NOTE — Progress Notes (Signed)
[name]   DOB:09/09/1934   JO#:[id number]    Subjective: No acute events overnight.  Patient received 1 unit of PRBC transfusion.  No transfusion reactions.  Eager to go home.  Objective:  Vitals:   07/01/20 2316 07/02/20 0751  BP: (!) 151/62 (!) 143/56  Pulse: 79 72  Resp: 18 17  Temp: 98.1 F (36.7 C) 97.7 F (36.5 C)  SpO2: 94% 99%     Intake/Output Summary (Last 24 hours) at 07/02/2020 1902 Last data filed at 07/02/2020 1012 Gross per 24 hour  Intake 867.92 ml  Output --  Net 867.92 ml    Physical Exam HENT:     Head: Normocephalic and atraumatic.     Mouth/Throat:     Pharynx: No oropharyngeal exudate.  Eyes:     Pupils: Pupils are equal, round, and reactive to light.  Cardiovascular:     Rate and Rhythm: Normal rate and regular rhythm.  Pulmonary:     Effort: No respiratory distress.     Breath sounds: No wheezing.     Comments: Decreased air entry bilaterally. Abdominal:     General: Bowel sounds are normal. There is no distension.     Palpations: Abdomen is soft. There is no mass.     Tenderness: There is no abdominal tenderness. There is no guarding or rebound.  Musculoskeletal:        General: No tenderness. Normal range of motion.     Cervical back: Normal range of motion and neck supple.  Skin:    General: Skin is warm.     Comments: Chronic bruising.  Right chest wall tunneled catheter.  Neurological:     Mental Status: She is alert and oriented to person, place, and time.  Psychiatric:        Mood and Affect: Affect normal.     Comments: Flat affect.      Labs:  Lab Results  Component Value Date   WBC 5.3 07/02/2020   HGB 7.4 (L) 07/02/2020   HCT 21.8 (L) 07/02/2020   MCV 86.5 07/02/2020   PLT 198 07/02/2020   NEUTROABS 4.2 07/02/2020    Lab Results  Component Value Date   NA 134 (L) 06/30/2020   K 4.6 06/30/2020   CL 98 06/30/2020   CO2 25 06/30/2020    Studies:  No results found.  Symptomatic anemia #86 year old  female patient with multiple medical problems including glomerulonephritis/end-stage renal disease is currently admitted to hospital for worsening renal function/hyperkalemia; noted to have worsening anemia  #Symptomatic anemia-nadir hemoglobin 6-7; normocytic-likely secondary to end-stage renal disease/iron deficiency; s/p PRBC transfusion 7.5.  No evidence of hemolysis or active GI bleed.  Recommend IV iron Venofer.   #Glomerular nephritis -end-stage renal disease/dialysis  #Coronary artery disease status post CABG   #Discussed with Dr. Benjaman Lobe Procrit/IV iron during dialysis.  Patient follow-up with me only as needed.  Discussed with Dr.Amery.    Cammie Sickle, MD 07/02/2020  7:02 PM

## 2020-07-02 NOTE — Progress Notes (Signed)
[place], [place] 07/02/20  Subjective:   Hospital day # 10.   Renal: 09/30 0701 - 10/01 0700 In: 2581.3 [P.O.:480; I.V.:1383.3; Blood:717.9] Out: -117  Lab Results  Component Value Date   CREATININE 7.11 (H) 06/30/2020   CREATININE 8.94 (H) 06/29/2020   CREATININE 7.43 (H) 06/28/2020   Patient is doing fair Completed her dialysis treatment yesterday Excited to be going home today  Objective:  Vital signs in last 24 hours:  Temp:  [97.7 F (36.5 C)-98.2 F (36.8 C)] 97.7 F (36.5 C) (10/01 0751) Pulse Rate:  [72-83] 72 (10/01 0751) Resp:  [17-18] 17 (10/01 0751) BP: (143-151)/(51-62) 143/56 (10/01 0751) SpO2:  [92 %-99 %] 99 % (10/01 0751)  Weight change:  Filed Weights   06/25/20 1400  Weight: 93.9 kg    Intake/Output:    Intake/Output Summary (Last 24 hours) at 07/02/2020 1630 Last data filed at 07/02/2020 1012 Gross per 24 hour  Intake 1641.25 ml  Output --  Net 1641.25 ml   Physical Exam: General:  Appears pale, tolerating well  HEENT  Normocephalic,Atraumatic  Pulm/lungs  room air, respiration unlabored  CVS/Heart  Regular  Abdomen:   Soft, non tender,  Extremities:  No peripheral edema  Neurologic:  Alert, oriented,speech appropriate and clear  Skin:  No acute lesions or rashes  Right IJ PermCath    Basic Metabolic Panel:  Recent Labs  Lab 06/26/20 0719 06/26/20 0719 06/27/20 0316 06/27/20 0316 06/28/20 0504 06/29/20 0416 06/30/20 0611  NA 138  --  139  --  137 133* 134*  K 4.7  --  4.6  --  4.4 4.9 4.6  CL 103  --  103  --  100 98 98  CO2 26  --  27  --  26 23 25   GLUCOSE 135*  --  137*  --  117* 136* 104*  BUN 69*  --  48*  --  67* 88* 52*  CREATININE 5.63*  --  5.27*  --  7.43* 8.94* 7.11*  CALCIUM 7.2*   < > 7.4*   < > 7.4* 7.2* 7.4*  PHOS  --   --   --   --  6.8*  --   --    < > = values in this interval not displayed.     CBC: Recent Labs  Lab 06/28/20 0504 06/28/20 0504 06/29/20 0416  06/30/20 0611 06/30/20 1319 07/01/20 0912 07/02/20 0611  WBC 9.5  --  8.3 6.5  --  5.5 5.3  NEUTROABS  --   --   --   --   --   --  4.2  HGB 8.3*   < > 7.6* 6.3* 7.0* 6.4* 7.4*  HCT 24.9*  --  22.7* 19.0*  --  19.4* 21.8*  MCV 88.0  --  88.0 87.6  --  89.0 86.5  PLT 166  --  190 180  --  199 198   < > = values in this interval not displayed.      Lab Results  Component Value Date   HEPBSAG NON REACTIVE 06/23/2020   HEPBSAB NON REACTIVE 06/23/2020   HEPBIGM NON REACTIVE 06/23/2020      Microbiology:  Recent Results (from the past 240 hour(s))  SARS Coronavirus 2 by RT PCR (hospital order, performed in [facility] hospital lab) Nasopharyngeal Nasopharyngeal Swab     Status: None   Collection Time: 06/22/20  4:33 PM   Specimen: Nasopharyngeal Swab  Result Value Ref Range  Status   SARS Coronavirus 2 NEGATIVE NEGATIVE Final    Comment: (NOTE) SARS-CoV-2 target nucleic acids are NOT DETECTED.  The SARS-CoV-2 RNA is generally detectable in upper and lower respiratory specimens during the acute phase of infection. The lowest concentration of SARS-CoV-2 viral copies this assay can detect is 250 copies / mL. A negative result does not preclude SARS-CoV-2 infection and should not be used as the sole basis for treatment or other patient management decisions.  A negative result may occur with improper specimen collection / handling, submission of specimen other than nasopharyngeal swab, presence of viral mutation(s) within the areas targeted by this assay, and inadequate number of viral copies (<250 copies / mL). A negative result must be combined with clinical observations, patient history, and epidemiological information.  Fact Sheet for Patients:   StrictlyIdeas.no  Fact Sheet for Healthcare Providers: BankingDealers.co.za  This test is not yet approved or  cleared by the [place] FDA and has been authorized for detection  and/or diagnosis of SARS-CoV-2 by FDA under an Emergency Use Authorization (EUA).  This EUA will remain in effect (meaning this test can be used) for the duration of the COVID-19 declaration under Section 564(b)(1) of the Act, 21 U.S.C. section 360bbb-3(b)(1), unless the authorization is terminated or revoked sooner.  Performed at [facility], [address] [place], [place] [address]     Coagulation Studies: No results for input(s): LABPROT, INR in the last 72 hours.  Urinalysis: No results for input(s): COLORURINE, LABSPEC, PHURINE, GLUCOSEU, HGBUR, BILIRUBINUR, KETONESUR, PROTEINUR, UROBILINOGEN, NITRITE, LEUKOCYTESUR in the last 72 hours.  Invalid input(s): APPERANCEUR    Imaging: No results found.   Medications:   . sodium chloride 250 mL (07/02/20 1059)  . iron sucrose 200 mg (07/02/20 1101)   . sodium chloride   Intravenous Once  . amLODipine  5 mg Oral Daily  . Chlorhexidine Gluconate Cloth  6 each Topical Q0600  . docusate sodium  200 mg Oral Daily  . epoetin (EPOGEN/PROCRIT) injection  10,000 Units Intravenous Q T,Th,Sa-HD  . insulin aspart  0-9 Units Subcutaneous TID WC  . levothyroxine  150 mcg Oral QAC breakfast  . melatonin  5 mg Oral QHS  . metoprolol tartrate  50 mg Oral Q8H  . predniSONE  20 mg Oral BID WC  . scopolamine  1 patch Transdermal Q72H  . sodium chloride flush  3 mL Intravenous Q12H   sodium chloride, ALPRAZolam, HYDROcodone-acetaminophen, morphine injection, ondansetron (ZOFRAN) IV, ondansetron, polyvinyl alcohol, sodium chloride, sodium chloride flush  Assessment/ Plan:  86 y.o. 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 Treated with high-dose steroids but then lost to follow-up Currently requiring dialysis. First session June 25, 2020 Review of outpatient records show that creatinine was baseline in August 2019 at 1.35, GFR 36 Creatinine trend has been increasing since April 23, 2020 Creatinine today is 7.11  Receiving dialysis treatment today Outpatient dialysis arranged at [facility] 11:00am  Repeat biopsy done on September 27th. 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  Severe interstitial fibrosis is noted along with severe tubular injury.  Advanced chronicity and mild to moderate activity is noted. Stay off hydralazine Given patient's age and comorbidities, and weighing risk and benefits, immunosuppression treatment may not be beneficial given advanced chronic changes in the kidneys  Started on steroids Prednisone 20 mg PO BID -Will be managed as outpatient Serologies from September 22 show ANA positive,p-ANCA positive, titer 1: 640, low C3, elevated kappa to lambda ratio of 34.6   # Hypertension BP readings within acceptable range, on Amlodipine and Metoprolol.  # Anemia of kidney disease Received blood transfusion during hospitalization Continue Epogen and IV iron as outpatient       LOS: [place] 10/1/20214:30 PM  [place], [place]

## 2020-07-02 NOTE — Evaluation (Signed)
Physical Therapy Evaluation Patient Details Name: [name] MRN: [id number] DOB: 1934/07/12 Today's Date: 07/02/2020   History of Present Illness  Pt is 86 y/o F with PMH: HTN, T2DM, CAD s/p CABG x4, GERD, CVAs, carotid stenosis, and stage IIIb CKD. Pt was seen by PCP on 9/20 and when lab results came back next day, he informed pt to go to hospital for worsening kidney function. Pt adm with hyperkalemia and AKI on CKD (currently stage 5).    Clinical Impression  Patient received in bed, reports she is feeling okay and is agreeable to PT assessment. She performed bed mobility with mod independence. Patient required min assist for initial standing from bed in low position with cues for hance placement.  Upon initial standing patient began urinating and required assistance to be cleaned up.  Stood again and ambulated 15 feet with RW and min guard. 1 lob during ambulation requiring min assist to correct. She then ambulated another 15 feet with RW and min guard and no lob. She will continue to benefit from skilled PT while here to improve functional independence, safety and strength for return home with husband.       Follow Up Recommendations Home health PT;Supervision for mobility/OOB    Equipment Recommendations  None recommended by PT    Recommendations for Other Services       Precautions / Restrictions Precautions Precautions: Fall Restrictions Weight Bearing Restrictions: No      Mobility  Bed Mobility Overal bed mobility: Modified Independent Bed Mobility: Supine to Sit     Supine to sit: Modified independent (Device/Increase time)        Transfers Overall transfer level: Needs assistance Equipment used: Rolling walker (2 wheeled) Transfers: Sit to/from Stand Sit to Stand: Min guard         General transfer comment: cues for safe hand placement with RW for ADL transfers, MIN/MOD A initially from low bed, but then able to perform transfers from chair  sit<>stand with MIN Guard on subsequent attempts. Progressed throughout session.  Ambulation/Gait Ambulation/Gait assistance: Min guard Gait Distance (Feet): 30 Feet Assistive device: Rolling walker (2 wheeled) Gait Pattern/deviations: Step-through pattern;Decreased stride length;Decreased step length - right;Decreased step length - left;Shuffle Gait velocity: decreased   General Gait Details: ambulated 15' x 2 with RW, 1 lob to her right requiring min assist to correct, and cues to keep hands on walker. Patient is wobbly with gait due to weakness. Will benefit from min guard for safety until strength improves.  Stairs            Wheelchair Mobility    Modified Rankin (Stroke Patients Only)       Balance Overall balance assessment: Needs assistance Sitting-balance support: Feet supported Sitting balance-Leahy Scale: Good     Standing balance support: Bilateral upper extremity supported;During functional activity Standing balance-Leahy Scale: Fair Standing balance comment: requires MIN guard and RW for B UE support.                             Pertinent Vitals/Pain Pain Assessment: No/denies pain    Home Living Family/patient expects to be discharged to:: Private residence Living Arrangements: Spouse/significant other Available Help at Discharge: Family;Available 24 hours/day Type of Home: House Home Access: Stairs to enter Entrance Stairs-Rails: Can reach both Entrance Stairs-Number of Steps: 1 Home Layout: One level Home Equipment: Walker - 2 wheels;Bedside commode;Shower seat      Prior Function Level of Independence:  Needs assistance   Gait / Transfers Assistance Needed: Pt states that she has 2ww (since CVA ~2010) and uses in community, but describes furniture walking in the home d/t narrow pathways.  ADL's / Homemaking Assistance Needed: Pt states she is able to dress (slip on shoes no socks), bathe and toilet with MOD I, but states that her  husband does help her as needed for socks/shoes with laces. States that he does the driving/gets groceries. States that he does most HH IADLs.        Hand Dominance   Dominant Hand: Right    Extremity/Trunk Assessment   Upper Extremity Assessment Upper Extremity Assessment: Defer to OT evaluation    Lower Extremity Assessment Lower Extremity Assessment: Generalized weakness;RLE deficits/detail RLE Deficits / Details: Reports knee OA RLE Sensation: WNL    Cervical / Trunk Assessment Cervical / Trunk Assessment: Normal  Communication   Communication: No difficulties  Cognition Arousal/Alertness: Awake/alert Behavior During Therapy: WFL for tasks assessed/performed Overall Cognitive Status: Within Functional Limits for tasks assessed                                 General Comments: alert and pleasant, somewhat HOH. Able to follow 1-2 step commands.      General Comments      Exercises Other Exercises Other Exercises: LAQ in sitting. Pain with this on R LE due to chronic knee pain   Assessment/Plan    PT Assessment Patient needs continued PT services  PT Problem List Decreased strength;Decreased mobility;Decreased activity tolerance;Decreased balance;Pain;Decreased safety awareness       PT Treatment Interventions DME instruction;Therapeutic activities;Therapeutic exercise;Gait training;Functional mobility training;Stair training;Neuromuscular re-education;Patient/family education;Balance training    PT Goals (Current goals can be found in the Care Plan section)  Acute Rehab PT Goals Patient Stated Goal: to go home PT Goal Formulation: With patient Time For Goal Achievement: 07/09/20 Potential to Achieve Goals: Good    Frequency Min 2X/week   Barriers to discharge        Co-evaluation               AM-PAC PT "6 Clicks" Mobility  Outcome Measure Help needed turning from your back to your side while in a flat bed without using  bedrails?: None Help needed moving from lying on your back to sitting on the side of a flat bed without using bedrails?: A Little Help needed moving to and from a bed to a chair (including a wheelchair)?: A Little Help needed standing up from a chair using your arms (e.g., wheelchair or bedside chair)?: A Little Help needed to walk in hospital room?: A Little Help needed climbing 3-5 steps with a railing? : A Lot 6 Click Score: 18    End of Session Equipment Utilized During Treatment: Gait belt Activity Tolerance: Patient limited by fatigue Patient left: in chair;with chair alarm set;with call bell/phone within reach;with family/visitor present Nurse Communication: Mobility status PT Visit Diagnosis: Unsteadiness on feet (R26.81);Muscle weakness (generalized) (M62.81);Difficulty in walking, not elsewhere classified (R26.2);Pain Pain - Right/Left: Right Pain - part of body: Knee    Time: 1000-1030 PT Time Calculation (min) (ACUTE ONLY): 30 min   Charges:   PT Evaluation $PT Eval Moderate Complexity: 1 Mod PT Treatments $Gait Training: 8-22 mins        Mamie Diiorio, PT, GCS 07/02/20,10:48 AM

## 2020-07-02 NOTE — Discharge Summary (Signed)
PASQUALINA COLASURDO ONG:[id number] DOB: June 23, 1934 DOA: 06/22/2020  PCP: Idelle Crouch, MD  Admit date: 06/22/2020 Discharge date: 07/02/2020  Admitted From: Home Disposition: Home  Recommendations for Outpatient Follow-up:  1. Follow up with PCP in 1 week 2. Please obtain BMP/CBC in one week 3. Hemodialysis tomorrow at 10:30 AM at [facility] 4. Follow-up with nephrology in 1 week  Home Health: PT   Discharge Condition:Stable CODE STATUS: Full Diet recommendation: Renal diet  Brief/Interim Summary: LAQUIA ROSANO is a 86 y.o. female with medical history significant for CKD 4/5, hypothyroid, CAD s/p cabg, carotid stenosis, CVA 04/2020, hypothyroid who was sent by PCP to the ER for evaluation of worsening renal function.  Nephrology was consulted.  Creatinine on admission was 5.15, K5.6.  Guaiac was negative  AKI on CKDIIIb: currently stage V, w/ hematuria. 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 Treated with high-dose steroids but then lost to follow-up Currently requiring dialysis. First session June 25, 2020 Repeat biopsy done on September 27th. 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 Severe interstitial fibrosis is noted along with severe tubular injury. Advanced chronicity and mild to moderate activity is noted. Stay off hydralazine Given patient's age and comorbidities, and weighing risk and benefits, immunosuppression treatment may not be beneficial given advanced chronic changes in the kidneys Patient started on prednisone 20 mg p.o.twice daily will be managed as outpatient Serologies from September 22 show ANA positive,p-ANCA positive, titer 1: 640, low C3, elevated kappa to lambda ratio of 34.6 Status post tunnel catheter for hemodialysis via right IJ on 9/24 by Dr. Lucky Cowboy   ACD- Hg 6.4.pt refused transfusion  yesterday but then agreed, status post 1 unit packed red blood cell on 9/30.  consulted hematology -input appreciated, thought likely 2/2 ESRD/iron deficiency.  Received IV iron infusion today and will have nephrology infused IV iron as outpatient On higher dose of epogen  Hyperkalemia: Stable now.  Continue to monitor   Thrombocytopenia: Resolved, continue to monitor   CAD: s/p CABG. Continue with metoprolol Plavix and asa on hold per nephrology.    Hx of CVA: recent July 2021 w/ residual right arm weakness. Asa and plavix  Can be resumed on Tuesday. Held due to renal bx    Carotid stenosis: recent IR procedure showed 30-40% left ICA stenosis. No acute intervention needed . Continue with outpt monitoring    DM2: BG stable. Continue home meds on d/c  Chronic pain: on norco  Anxiety:severity unknown. 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 Continue to hold home dose of lasix secondary CKD.    Discharge Diagnoses:  Principal Problem:   Hyperkalemia Active Problems:   Diabetes mellitus (HCC)   Hypertension   S/P CABG x 4   Cerebral embolism with cerebral infarction (HCC)   CAD (coronary artery disease)   Thyroid disease   HTN, goal below 140/80   CKD (chronic kidney disease) stage 5, GFR less than 15 ml/min (HCC)   Carotid stenosis   Prolonged QT interval   Symptomatic anemia    Discharge Instructions  Discharge Instructions    Diet - low sodium heart healthy   Complete by: As directed    Discharge instructions   Complete by: As directed    Be at dialysis on Saturday at 10:15am.  Can resume aspirin and plavix on tuedsday Follow up with pcp for blood work   Increase activity  slowly   Complete by: As directed    No wound care   Complete by: As directed    Check with dialysis about dressing     Allergies as of 07/02/2020      Reactions   Clonidine Other (See Comments), Shortness Of  Breath   Darvocet [propoxyphene N-acetaminophen] Anaphylaxis   Hydralazine Hcl Other (See Comments)   glomerulonephritis   Duratuss G [guaifenesin]    Other reaction(s): Unknown   Ketek  [telithromycin]    Other reaction(s): Unknown   Ciprocinonide [fluocinolone] Other (See Comments)   Feels crazy   Ivp Dye [iodinated Diagnostic Agents] Nausea And Vomiting, Other (See Comments)   Burning   Levaquin [levofloxacin] Nausea Only   Statins Other (See Comments)   Myalgia      Medication List    STOP taking these medications   aspirin 81 MG chewable tablet   clopidogrel 75 MG tablet Commonly known as: PLAVIX   hydrALAZINE 25 MG tablet Commonly known as: APRESOLINE     TAKE these medications   ALPRAZolam 0.5 MG tablet Commonly known as: XANAX Take 0.5 mg by mouth 2 (two) times daily as needed for anxiety. Notes to patient: Last dose given 07/02/2020 at 01:21am   amLODipine 5 MG tablet Commonly known as: NORVASC Take 1 tablet (5 mg total) by mouth daily. Start taking on: July 03, 2020 Notes to patient: Last dose given 07/02/2020 at 09:10am   calcium-vitamin D 500-200 MG-UNIT tablet Commonly known as: OSCAL WITH D Take 1 tablet by mouth daily. Notes to patient: Not given while in hospital.    cetirizine 10 MG tablet Commonly known as: ZYRTEC Take 10 mg by mouth daily. Notes to patient: Not given while in hospital.    Cyanocobalamin 1000 MCG Subl Take 1,000 mcg by mouth daily. Notes to patient: Not given while in hospital.    famotidine 20 MG tablet Commonly known as: PEPCID Take 1 tablet (20 mg total) by mouth 2 (two) times daily. Notes to patient: Not given while in hospital.    gabapentin 100 MG capsule Commonly known as: NEURONTIN Take 100 mg by mouth 2 (two) times daily. Notes to patient: Not given while in hospital.    glimepiride 4 MG tablet Commonly known as: AMARYL Take 4 mg by mouth daily with breakfast. Notes to patient: Not given while in  hospital.    HYDROcodone-acetaminophen 5-325 MG tablet Commonly known as: NORCO/VICODIN Take 1 tablet by mouth every 6 (six) hours as needed for moderate pain. Notes to patient: Last dose given 07/02/2020 at 11:22am   levothyroxine 150 MCG tablet Commonly known as: SYNTHROID Take 150 mcg daily before breakfast by mouth. Notes to patient: Last dose given 07/02/2020 at 05:05am   metoprolol tartrate 50 MG tablet Commonly known as: LOPRESSOR Take 50 mg by mouth in the morning, at noon, and at bedtime. Notes to patient: Last dose given 07/02/2020 at 12:51pm   omeprazole 20 MG capsule Commonly known as: PriLOSEC Take 1 capsule (20 mg total) by mouth daily. Notes to patient: Not given while in hospital.    predniSONE 20 MG tablet Commonly known as: DELTASONE Take 1 tablet (20 mg total) by mouth 2 (two) times daily with a meal. Notes to patient: Last dose given 07/02/2020 at 09:10am       Follow-up Information    Sparks, Leonie Douglas, MD In 1 week.   Specialty: Internal Medicine Why: needs cbc; October 6 @ 10:45a Contact information: [address]  [place] [address] [phone]        Lavonia Dana, MD In 1 week.   Specialty: Nephrology Contact information: [address] [place] [address] [phone]              Allergies  Allergen Reactions  . Clonidine Other (See Comments) and Shortness Of Breath  . Darvocet [Propoxyphene N-Acetaminophen] Anaphylaxis  . Hydralazine Hcl Other (See Comments)    glomerulonephritis  . Duratuss G [Guaifenesin]     Other reaction(s): Unknown  . Ketek  [Telithromycin]     Other reaction(s): Unknown  . Ciprocinonide [Fluocinolone] Other (See Comments)    Feels crazy  . Ivp Dye [Iodinated Diagnostic Agents] Nausea And Vomiting and Other (See Comments)    Burning  . Levaquin [Levofloxacin] Nausea Only  . Statins Other (See Comments)    Myalgia     Consultations:  Vascular nephrology   Procedures/Studies: DG Chest 2 View  Result Date: 06/23/2020 CLINICAL DATA:  CHF, acute kidney failure EXAM: CHEST - 2 VIEW COMPARISON:  May 04, 2016 FINDINGS: Post median sternotomy for CABG.  Stable cardiac enlargement. Mild interstitial prominence bilaterally. No lobar consolidation. Patchy opacity with linear characteristics in the LEFT lung base. No acute skeletal process on limited assessment. IMPRESSION: 1. Cardiomegaly with mild interstitial prominence which may reflect mild edema. No lobar consolidation. 2. Areas of platelike in linear opacity at the LEFT lung base likely atelectasis. Electronically Signed   By: Zetta Bills M.D.   On: 06/23/2020 11:42   US RENAL  Result Date: 06/10/2020 CLINICAL DATA:  Initial evaluation for acute renal failure. EXAM: RENAL / URINARY TRACT ULTRASOUND COMPLETE COMPARISON:  Prior ultrasound from 04/30/2016. FINDINGS: Right Kidney: Renal measurements: 11.1 x 4.7 x 4.9 cm = volume: 134.8 mL. Increased echogenicity seen within the renal parenchyma. No nephrolithiasis or hydronephrosis. No focal renal mass. Left Kidney: Renal measurements: 11.5 x 5.0 x 5.1 cm = volume: 152.3 mL. Mildly increased echogenicity within the renal parenchyma. No nephrolithiasis or hydronephrosis. No focal renal mass. Bladder: Appears normal for degree of bladder distention. Other: 1.7 cm splenule noted. IMPRESSION: 1. Mildly increased echogenicity within the renal parenchyma, suggesting medical renal disease. 2. No hydronephrosis or other acute abnormality. Electronically Signed   By: Jeannine Boga M.D.   On: 06/10/2020 21:34   PERIPHERAL VASCULAR CATHETERIZATION  Result Date: 06/25/2020 See op note  PERIPHERAL VASCULAR CATHETERIZATION  Result Date: 06/14/2020 See op note  ECHOCARDIOGRAM COMPLETE  Result Date: 06/24/2020    ECHOCARDIOGRAM REPORT   Patient Name:   YARED SUSAN Date of Exam: 06/23/2020 Medical Rec #:   [id number]         Height:       64.0 in Accession #:    [id number]        Weight:       207.0 lb Date of Birth:  Feb 21, 1934         BSA:          1.985 m Patient Age:    35 years          BP:           178/94 mmHg Patient Gender: F                 HR:           87 bpm. Exam Location:  [facility] Procedure: 2D Echo, Cardiac Doppler and Color Doppler Indications:     J50.09 Acute Diastolic CHF  History:  Patient has prior history of Echocardiogram examinations, most                  recent 07/17/2015. Prior CABG; Risk Factors:Hypertension,                  Dyslipidemia and Diabetes. Thyroid disease. Pneumonia. History                  of breast cancer. Dyspnea. Coronary artery disease. Chronic                  kidney disease.  Sonographer:     Wilford Sports Rodgers-Jones Referring Phys:  [id number] [place] Diagnosing Phys: Kate Sable MD IMPRESSIONS  1. Left ventricular ejection fraction, by estimation, is 60 to 65%. The left ventricle has normal function. The left ventricle has no regional wall motion abnormalities. Left ventricular diastolic parameters were normal.  2. Right ventricular systolic function is normal. The right ventricular size is normal. There is normal pulmonary artery systolic pressure.  3. The mitral valve is normal in structure. No evidence of mitral valve regurgitation. No evidence of mitral stenosis.  4. The aortic valve was not well visualized. Aortic valve regurgitation is not visualized.  5. The inferior vena cava is normal in size with greater than 50% respiratory variability, suggesting right atrial pressure of 3 mmHg. FINDINGS  Left Ventricle: Left ventricular ejection fraction, by estimation, is 60 to 65%. The left ventricle has normal function. The left ventricle has no regional wall motion abnormalities. The left ventricular internal cavity size was normal in size. There is  no left ventricular hypertrophy. Left ventricular diastolic parameters were normal. Right Ventricle: The  right ventricular size is normal. No increase in right ventricular wall thickness. Right ventricular systolic function is normal. There is normal pulmonary artery systolic pressure. The tricuspid regurgitant velocity is 2.74 m/s, and  with an assumed right atrial pressure of 3 mmHg, the estimated right ventricular systolic pressure is 40.1 mmHg. Left Atrium: Left atrial size was normal in size. Right Atrium: Right atrial size was normal in size. Pericardium: There is no evidence of pericardial effusion. Mitral Valve: The mitral valve is normal in structure. No evidence of mitral valve regurgitation. No evidence of mitral valve stenosis. Tricuspid Valve: The tricuspid valve is normal in structure. Tricuspid valve regurgitation is not demonstrated. No evidence of tricuspid stenosis. Aortic Valve: The aortic valve was not well visualized. Aortic valve regurgitation is not visualized. Pulmonic Valve: The pulmonic valve was normal in structure. Pulmonic valve regurgitation is not visualized. No evidence of pulmonic stenosis. Aorta: The aortic root is normal in size and structure. Venous: The inferior vena cava is normal in size with greater than 50% respiratory variability, suggesting right atrial pressure of 3 mmHg. IAS/Shunts: No atrial level shunt detected by color flow Doppler.  LEFT VENTRICLE PLAX 2D LVIDd:         5.03 cm  Diastology LVIDs:         2.94 cm  LV e' medial:    8.38 cm/s LV PW:         0.99 cm  LV E/e' medial:  11.6 LV IVS:        0.91 cm  LV e' lateral:   9.36 cm/s LVOT diam:     2.00 cm  LV E/e' lateral: 10.4 LV SV:         61 LV SV Index:   31 LVOT Area:     3.14 cm  RIGHT VENTRICLE  IVC RV Basal diam:  3.51 cm     IVC diam: 1.51 cm RV S prime:     15.00 cm/s TAPSE (M-mode): 2.1 cm LEFT ATRIUM             Index       RIGHT ATRIUM           Index LA diam:        4.30 cm 2.17 cm/m  RA Area:     15.90 cm LA Vol (A2C):   57.4 ml 28.91 ml/m RA Volume:   48.10 ml  24.23 ml/m LA Vol (A4C):    58.5 ml 29.47 ml/m LA Biplane Vol: 59.5 ml 29.97 ml/m  AORTIC VALVE LVOT Vmax:   79.30 cm/s LVOT Vmean:  56.600 cm/s LVOT VTI:    0.195 m  AORTA Ao Root diam: 2.60 cm Ao Asc diam:  3.20 cm MITRAL VALVE                TRICUSPID VALVE MV Area (PHT): 3.42 cm     TR Peak grad:   30.0 mmHg MV Decel Time: 222 msec     TR Vmax:        274.00 cm/s MV E velocity: 97.30 cm/s MV A velocity: 117.00 cm/s  SHUNTS MV E/A ratio:  0.83         Systemic VTI:  0.20 m                             Systemic Diam: 2.00 cm Kate Sable MD Electronically signed by Kate Sable MD Signature Date/Time: 06/24/2020/12:36:36 PM    Final    US BIOPSY (KIDNEY)  Result Date: 06/28/2020 INDICATION: Chronic kidney disease, acute kidney injury, hematuria and need for renal biopsy for further workup. EXAM: ULTRASOUND GUIDED CORE BIOPSY OF RIGHT KIDNEY MEDICATIONS: None. ANESTHESIA/SEDATION: Fentanyl 50 mcg IV; Versed 1.0 mg IV Moderate Sedation Time:  21 minutes. The patient was continuously monitored during the procedure by the interventional radiology nurse under my direct supervision. PROCEDURE: The procedure, risks, benefits, and alternatives were explained to the patient. Questions regarding the procedure were encouraged and answered. The patient understands and consents to the procedure. A time-out was performed prior to initiating the procedure. Both kidneys were examined in a prone position. The right was chosen for biopsy. The right flank region was prepped with chlorhexidine in a sterile fashion, and a sterile drape was applied covering the operative field. A sterile gown and sterile gloves were used for the procedure. Local anesthesia was provided with 1% Lidocaine. A 15 gauge trocar needle was advanced to the level of lower pole cortex of the right kidney. Under ultrasound guidance, 2 separate 16 gauge core biopsy samples were obtained. Core biopsy samples were submitted on saline soaked Telfa gauze. A slurry of Gel-Foam  pledgets mixed in normal saline was then injected through the 15 gauge needle as the needle was retracted and removed. Additional ultrasound was performed. COMPLICATIONS: None immediate. FINDINGS: Both kidneys are visible by ultrasound. The right kidney was less deep in location and better visualized with slightly thicker renal cortex compared to the left. Solid core biopsy samples were obtained. IMPRESSION: Ultrasound-guided core biopsy performed at the level of right lower pole renal cortex. Electronically Signed   By: Aletta Edouard M.D.   On: 06/28/2020 10:42       Subjective: Feels well. Denies cp or sob. Wants to go home  Discharge Exam: Vitals:  07/01/20 2316 07/02/20 0751  BP: (!) 151/62 (!) 143/56  Pulse: 79 72  Resp: 18 17  Temp: 98.1 F (36.7 C) 97.7 F (36.5 C)  SpO2: 94% 99%   Vitals:   07/01/20 1619 07/01/20 1915 07/01/20 2316 07/02/20 0751  BP: (!) 128/50 (!) 143/51 (!) 151/62 (!) 143/56  Pulse: 84 83 79 72  Resp: 20 18 18 17   Temp: 98.5 F (36.9 C) 98.2 F (36.8 C) 98.1 F (36.7 C) 97.7 F (36.5 C)  TempSrc:  Oral Oral Oral  SpO2: 91% 92% 94% 99%  Weight:      Height:        General: Pt is alert, awake, not in acute distress Cardiovascular: RRR, S1/S2 +, no rubs, no gallops Respiratory: CTA bilaterally, no wheezing, no rhonchi Abdominal: Soft, NT, ND, bowel sounds + Extremities: no edema, no cyanosis    The results of significant diagnostics from this hospitalization (including imaging, microbiology, ancillary and laboratory) are listed below for reference.     Microbiology: No results found for this or any previous visit (from the past 240 hour(s)).   Labs: BNP (last 3 results) Recent Labs    06/23/20 1147  BNP 157.2*   Basic Metabolic Panel: Recent Labs  Lab 06/26/20 0719 06/27/20 0316 06/28/20 0504 06/29/20 0416 06/30/20 0611  NA 138 139 137 133* 134*  K 4.7 4.6 4.4 4.9 4.6  CL 103 103 100 98 98  CO2 26 27 26 23 25   GLUCOSE  135* 137* 117* 136* 104*  BUN 69* 48* 67* 88* 52*  CREATININE 5.63* 5.27* 7.43* 8.94* 7.11*  CALCIUM 7.2* 7.4* 7.4* 7.2* 7.4*  PHOS  --   --  6.8*  --   --    Liver Function Tests: No results for input(s): AST, ALT, ALKPHOS, BILITOT, PROT, ALBUMIN in the last 168 hours. No results for input(s): LIPASE, AMYLASE in the last 168 hours. No results for input(s): AMMONIA in the last 168 hours. CBC: Recent Labs  Lab 06/28/20 0504 06/28/20 0504 06/29/20 0416 06/30/20 0611 06/30/20 1319 07/01/20 0912 07/02/20 0611  WBC 9.5  --  8.3 6.5  --  5.5 5.3  NEUTROABS  --   --   --   --   --   --  4.2  HGB 8.3*   < > 7.6* 6.3* 7.0* 6.4* 7.4*  HCT 24.9*  --  22.7* 19.0*  --  19.4* 21.8*  MCV 88.0  --  88.0 87.6  --  89.0 86.5  PLT 166  --  190 180  --  199 198   < > = values in this interval not displayed.   Cardiac Enzymes: No results for input(s): CKTOTAL, CKMB, CKMBINDEX, TROPONINI in the last 168 hours. BNP: Invalid input(s): POCBNP CBG: Recent Labs  Lab 07/01/20 0838 07/01/20 1711 07/01/20 2007 07/02/20 0821 07/02/20 1133  GLUCAP 166* 142* 108* 114* 174*   D-Dimer No results for input(s): DDIMER in the last 72 hours. Hgb A1c No results for input(s): HGBA1C in the last 72 hours. Lipid Profile No results for input(s): CHOL, HDL, LDLCALC, TRIG, CHOLHDL, LDLDIRECT in the last 72 hours. Thyroid function studies No results for input(s): TSH, T4TOTAL, T3FREE, THYROIDAB in the last 72 hours.  Invalid input(s): FREET3 Anemia work up Recent Labs    07/02/20 0611  VITAMINB12 2,356*  FOLATE 13.6   Urinalysis    Component Value Date/Time   COLORURINE RED (A) 06/23/2020 1025   APPEARANCEUR HAZY (A) 06/23/2020 1025   APPEARANCEUR  Clear 05/03/2014 0503   LABSPEC 1.007 06/23/2020 1025   LABSPEC 1.015 05/03/2014 0503   PHURINE  06/23/2020 1025    TEST NOT REPORTED DUE TO COLOR INTERFERENCE OF URINE PIGMENT   GLUCOSEU (A) 06/23/2020 1025    TEST NOT REPORTED DUE TO COLOR  INTERFERENCE OF URINE PIGMENT   GLUCOSEU Negative 05/03/2014 0503   HGBUR (A) 06/23/2020 1025    TEST NOT REPORTED DUE TO COLOR INTERFERENCE OF URINE PIGMENT   BILIRUBINUR (A) 06/23/2020 1025    TEST NOT REPORTED DUE TO COLOR INTERFERENCE OF URINE PIGMENT   BILIRUBINUR Negative 05/03/2014 0503   KETONESUR (A) 06/23/2020 1025    TEST NOT REPORTED DUE TO COLOR INTERFERENCE OF URINE PIGMENT   PROTEINUR (A) 06/23/2020 1025    TEST NOT REPORTED DUE TO COLOR INTERFERENCE OF URINE PIGMENT   UROBILINOGEN 0.2 09/03/2012 1355   NITRITE (A) 06/23/2020 1025    TEST NOT REPORTED DUE TO COLOR INTERFERENCE OF URINE PIGMENT   LEUKOCYTESUR (A) 06/23/2020 1025    TEST NOT REPORTED DUE TO COLOR INTERFERENCE OF URINE PIGMENT   LEUKOCYTESUR Negative 05/03/2014 0503   Sepsis Labs Invalid input(s): PROCALCITONIN,  WBC,  LACTICIDVEN Microbiology No results found for this or any previous visit (from the past 240 hour(s)).   Time coordinating discharge: Over 30 minutes  SIGNED:   Nolberto Hanlon, MD  [facility] 07/02/2020, 6:58 PM Pager   If 7PM-7AM, please contact night-coverage [email or web address] Password [email or web address]

## 2020-07-03 LAB — TYPE AND SCREEN
ABO/RH(D): O POS
Antibody Screen: NEGATIVE
Unit division: 0
Unit division: 0

## 2020-07-03 LAB — BPAM RBC
Blood Product Expiration Date: 202110282359
Blood Product Expiration Date: 202110292359
ISSUE DATE / TIME: 202109301559
Unit Type and Rh: 5100
Unit Type and Rh: 5100

## 2020-07-03 LAB — HAPTOGLOBIN: Haptoglobin: 262 mg/dL (ref 41–333)

## 2020-07-03 LAB — PREPARE RBC (CROSSMATCH)

## 2020-07-06 ENCOUNTER — Emergency Department
Admission: EM | Admit: 2020-07-06 | Discharge: 2020-07-06 | Disposition: A | Discharge status: Home / Self Care | Payer: Medicare Other | Attending: Emergency Medicine | Admitting: Emergency Medicine

## 2020-07-06 ENCOUNTER — Emergency Department: Payer: Medicare Other

## 2020-07-06 ENCOUNTER — Other Ambulatory Visit: Payer: Self-pay

## 2020-07-06 DIAGNOSIS — Z79899 Other long term (current) drug therapy: Secondary | ICD-10-CM | POA: Insufficient documentation

## 2020-07-06 DIAGNOSIS — E039 Hypothyroidism, unspecified: Secondary | ICD-10-CM | POA: Diagnosis not present

## 2020-07-06 DIAGNOSIS — I25810 Atherosclerosis of coronary artery bypass graft(s) without angina pectoris: Secondary | ICD-10-CM | POA: Insufficient documentation

## 2020-07-06 DIAGNOSIS — Z9011 Acquired absence of right breast and nipple: Secondary | ICD-10-CM | POA: Insufficient documentation

## 2020-07-06 DIAGNOSIS — I1311 Hypertensive heart and chronic kidney disease without heart failure, with stage 5 chronic kidney disease, or end stage renal disease: Secondary | ICD-10-CM | POA: Diagnosis not present

## 2020-07-06 DIAGNOSIS — C50811 Malignant neoplasm of overlapping sites of right female breast: Secondary | ICD-10-CM | POA: Diagnosis not present

## 2020-07-06 DIAGNOSIS — N185 Chronic kidney disease, stage 5: Secondary | ICD-10-CM | POA: Diagnosis not present

## 2020-07-06 DIAGNOSIS — E1121 Type 2 diabetes mellitus with diabetic nephropathy: Secondary | ICD-10-CM | POA: Diagnosis not present

## 2020-07-06 DIAGNOSIS — Z87891 Personal history of nicotine dependence: Secondary | ICD-10-CM | POA: Insufficient documentation

## 2020-07-06 DIAGNOSIS — R609 Edema, unspecified: Secondary | ICD-10-CM

## 2020-07-06 DIAGNOSIS — Z7984 Long term (current) use of oral hypoglycemic drugs: Secondary | ICD-10-CM | POA: Diagnosis not present

## 2020-07-06 DIAGNOSIS — R600 Localized edema: Secondary | ICD-10-CM | POA: Insufficient documentation

## 2020-07-06 DIAGNOSIS — M7989 Other specified soft tissue disorders: Secondary | ICD-10-CM | POA: Diagnosis present

## 2020-07-06 LAB — BASIC METABOLIC PANEL
Anion gap: 11 (ref 5–15)
BUN: 20 mg/dL (ref 8–23)
CO2: 26 mmol/L (ref 22–32)
Calcium: 7.3 mg/dL — ABNORMAL LOW (ref 8.9–10.3)
Chloride: 98 mmol/L (ref 98–111)
Creatinine, Ser: 2.81 mg/dL — ABNORMAL HIGH (ref 0.44–1.00)
GFR calc non Af Amer: 15 mL/min — ABNORMAL LOW (ref >60)
Glucose, Bld: 145 mg/dL — ABNORMAL HIGH (ref 70–99)
Potassium: 3.3 mmol/L — ABNORMAL LOW (ref 3.5–5.1)
Sodium: 135 mmol/L (ref 135–145)

## 2020-07-06 LAB — CBC
HCT: 25.3 % — ABNORMAL LOW (ref 36.0–46.0)
Hemoglobin: 8.4 g/dL — ABNORMAL LOW (ref 12.0–15.0)
MCH: 29.2 pg (ref 26.0–34.0)
MCHC: 33.2 g/dL (ref 30.0–36.0)
MCV: 87.8 fL (ref 80.0–100.0)
Platelets: 230 10*3/uL (ref 150–400)
RBC: 2.88 MIL/uL — ABNORMAL LOW (ref 3.87–5.11)
RDW: 15.9 % — ABNORMAL HIGH (ref 11.5–15.5)
WBC: 13 10*3/uL — ABNORMAL HIGH (ref 4.0–10.5)
nRBC: 0 % (ref 0.0–0.2)

## 2020-07-06 NOTE — ED Notes (Signed)
Assumed care of pt, pt denies pain to R foot. +3 pitting edema noted to RLE below the knee. Blue discoloration to quarter sized location of anterior foot. Cap refill <2 in R toes. Denies sob or cp. Family at bedside. Awaiting [place]. AO x4. Talking in full sentences with regular and unlabored breathing. Side rails up x2. Call bell within reach.

## 2020-07-06 NOTE — ED Notes (Signed)
BP 191/77 then 211/87, pt has missed two doses of BP meds today due to being in the ER, she is instructed to take her medication as soon as she gets home.  Dr. Archie Balboa aware and ok with proceeding with discharge.

## 2020-07-06 NOTE — Discharge Instructions (Addendum)
Please use compression stockings on your right leg. If you develop shortness of breath, chest pain, leg pain, discoloration of the leg or any other new or concerning symptoms please seek medical attention.

## 2020-07-06 NOTE — ED Triage Notes (Addendum)
Pt arrived via [facility] from [facility] with reports of  Right leg and foot swelling since yesterday, significant swelling noted to right foot with 1-2+ pitting edema.  Bruising also noted to top of foot.  Pt denies any known injury, but EMS did report pt has had frequent falls.  Pt did complete full dialysis treatment today, dialysis days are TTS. Pt is a new dialysis patient  Per EMS, husband also reported that he is unable to care for the patient at home and is considering placement as an option.

## 2020-07-06 NOTE — ED Notes (Signed)
Severe swelling to right lower calf and foot. Warm to touch.  +4 pitting edema to foot. +3 pitting edema to calf. Darkened circular area on anterior lateral part of foot. Pt denies pain anywhere and states the swelling started yesterday. Pt states she has not been able to walk around since recently being discharged from the hospital because she has not had any physical therapy.

## 2020-07-06 NOTE — ED Notes (Addendum)
Signature pad inaccessible. Discharge paperwork was reviewed with patient and patient verbalized understanding and had no questions at this time.

## 2020-07-06 NOTE — ED Provider Notes (Signed)
[facility] Emergency Department Provider Note  ____________________________________________   I have reviewed the triage vital signs and the nursing notes.   HISTORY  Chief Complaint Foot Swelling   History limited by: Not Limited   HPI [name] is a 86 y.o. female who presents to the emergency department today because of concern for right leg swelling. The swelling was first noticed yesterday. Continued to get worse today. The patient denies any significant pain. The patient denies swelling in the past. No known injury to the leg. The patient states that she recently has started dialysis. The patient has not had any worsening shortness of breath. Denies any chest pain. No fevers.    Records reviewed. Per medical record review patient has a history of DM, ESRD on dialysis.   Past Medical History:  Diagnosis Date  . Angina pectoris ([place]) 08/26/2012  . Arthritis   . Breast cancer ([place]) 2012   right breast  . Breast mass, right   . Cancer [facility])    breast cancer, right side 2012  . Chronic kidney disease    RENAL INSUFF  . Complication of anesthesia   . Coronary artery disease 08/22/2012   sees Dr Rockey Situ  . Diabetes ([place])   . Dyspnea    ON EXERTION  . Gastritis    hx of  . GERD (gastroesophageal reflux disease)   . Headache(784.0)    migraines  . History of breast cancer    39 treatments of radiation. Negative chemo.  Marland Kitchen History of seasonal allergies   . Hyperlipemia   . Hypertension    sees Dr. Fulton Reek  . Hypothyroidism   . Kidney disease, chronic, stage IV (GFR 15-29 ml/min) (HCC)   . Macular degeneration    Bilateral  . Macular degeneration 2018  . Neuropathy   . Personal history of radiation therapy   . Pneumonia    hx of  . PONV (postoperative nausea and vomiting)   . 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 Stroke ([place])    2012  . Thyroid disease   .  Vertigo     Patient Active Problem List   Diagnosis Date Noted  . Symptomatic anemia 07/01/2020  . Hyperkalemia 06/22/2020  . Prolonged QT interval 06/22/2020  . Stroke ([place]) 06/01/2020  . CAD (coronary artery disease) 06/01/2020  . Thyroid disease 06/01/2020  . CKD (chronic kidney disease) stage 5, GFR less than 15 ml/min (HCC) 06/01/2020  . Carotid stenosis 06/01/2020  . Pain syndrome, chronic 02/16/2020  . Weakness 08/30/2016  . Acute renal failure (ARF) ([place]) 05/04/2016  . Type 2 diabetes with nephropathy ([place]) 09/02/2015  . HTN, goal below 140/80 08/20/2015  . Diabetes mellitus type 2, uncomplicated ([place]) 07/37/1062  . Cerebral embolism with cerebral infarction ([place]) 09/07/2012  . Hemiplegia, unspecified, affecting dominant side 09/07/2012  . 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 Coronary artery disease 08/22/2012  . Hyperlipidemia 08/19/2012  . Diabetes mellitus ([place]) 08/19/2012  . Hypertension 08/19/2012  . Breast cancer ([place]) 08/19/2012  . GERD (gastroesophageal reflux disease) 08/19/2012    Past Surgical History:  Procedure Laterality Date  . ABDOMINAL HYSTERECTOMY    . APPENDECTOMY    . BACK SURGERY    . BREAST BIOPSY Right    2012 positive- [facility]  . BREAST LUMPECTOMY Right 2012   F/U radiation   . BREAST SURGERY    .  CARDIAC CATHETERIZATION  2014  . CAROTID PTA/STENT INTERVENTION Left 06/14/2020   Procedure: CAROTID PTA/STENT INTERVENTION;  Surgeon: Algernon Huxley, MD;  Location: [facility];  Service: Cardiovascular;  Laterality: Left;  . CATARACT EXTRACTION W/PHACO Right 08/14/2017   Procedure: CATARACT EXTRACTION PHACO AND INTRAOCULAR LENS PLACEMENT (IOC);  Surgeon: Birder Robson, MD;  Location: [facility];  Service: Ophthalmology;  Laterality: Right;  [place] 00:43.0 AP% 17.1 CDE 7.37 Fluid Pack lot # [id number]  . CATARACT EXTRACTION W/PHACO Left 10/09/2017   Procedure: CATARACT EXTRACTION PHACO AND INTRAOCULAR LENS PLACEMENT ([place]);  Surgeon: Birder Robson, MD;   Location: [facility];  Service: Ophthalmology;  Laterality: Left;  [place] 00:30.3 AP% 11.0 CDE 3.33 Fluid Pack Lot # [id number]  . CORONARY ARTERY BYPASS GRAFT  09/05/2012   Procedure: CORONARY ARTERY BYPASS GRAFTING (CABG);  Surgeon: Rexene Alberts, MD;  Location: [place];  Service: Open Heart Surgery;  Laterality: N/A;  CABG x four, using left internal mammary artery and bilateral greater saphenous vein harvested endoscopically  . DIALYSIS/PERMA CATHETER INSERTION Left 06/25/2020   Procedure: DIALYSIS/PERMA CATHETER INSERTION;  Surgeon: Algernon Huxley, MD;  Location: [facility];  Service: Cardiovascular;  Laterality: Left;  . INTRAOPERATIVE TRANSESOPHAGEAL ECHOCARDIOGRAM  09/05/2012   Procedure: INTRAOPERATIVE TRANSESOPHAGEAL ECHOCARDIOGRAM;  Surgeon: Rexene Alberts, MD;  Location: [facility];  Service: Open Heart Surgery;  Laterality: N/A;  . KNEE SURGERY     Bilateral nerve block  . LAPAROSCOPIC NISSEN FUNDOPLICATION    . OVARIAN CYST REMOVAL      Prior to Admission medications   Medication Sig Start Date End Date Taking? Authorizing Provider  ALPRAZolam Duanne Moron) 0.5 MG tablet Take 0.5 mg by mouth 2 (two) times daily as needed for anxiety.     [provider]  amLODipine (NORVASC) 5 MG tablet Take 1 tablet (5 mg total) by mouth daily. 07/03/20   Nolberto Hanlon, MD  calcium-vitamin D (OSCAL WITH D) 500-200 MG-UNIT tablet Take 1 tablet by mouth daily.    [provider]  cetirizine (ZYRTEC) 10 MG tablet Take 10 mg by mouth daily.    [provider]  Cyanocobalamin 1000 MCG SUBL Take 1,000 mcg by mouth daily.     [provider]  famotidine (PEPCID) 20 MG tablet Take 1 tablet (20 mg total) by mouth 2 (two) times daily. 07/02/20 07/02/21  Nolberto Hanlon, MD  gabapentin (NEURONTIN) 100 MG capsule Take 100 mg by mouth 2 (two) times daily. 09/23/12   Love, Ivan Anchors, PA-C  glimepiride (AMARYL) 4 MG tablet Take 4 mg by mouth daily with breakfast.     [provider]   HYDROcodone-acetaminophen (NORCO/VICODIN) 5-325 MG tablet Take 1 tablet by mouth every 6 (six) hours as needed for moderate pain.    [provider]  levothyroxine (SYNTHROID, LEVOTHROID) 150 MCG tablet Take 150 mcg daily before breakfast by mouth.    [provider]  metoprolol tartrate (LOPRESSOR) 50 MG tablet Take 50 mg by mouth in the morning, at noon, and at bedtime.    [provider]  omeprazole (PRILOSEC) 20 MG capsule Take 1 capsule (20 mg total) by mouth daily. 07/02/20 08/01/20  Nolberto Hanlon, MD  predniSONE (DELTASONE) 20 MG tablet Take 1 tablet (20 mg total) by mouth 2 (two) times daily with a meal. 07/02/20 08/01/20  Nolberto Hanlon, MD    Allergies Clonidine, Darvocet [propoxyphene n-acetaminophen], Hydralazine, Hydralazine hcl, Esomeprazole, Guaifenesin, Phenylephrine-guaifenesin, Rabeprazole, Telithromycin, Fluocinolone, Iodinated diagnostic agents, Levaquin [levofloxacin], and Statins  Family  History  Problem Relation Age of Onset  . Heart disease Brother        CABG & stents  . Hyperlipidemia Brother   . Hypertension Brother   . Cancer Father   . Heart disease Mother   . Breast cancer Cousin   . Kidney disease Neg Hx     Social History Social History   Tobacco Use  . Smoking status: Former Smoker    Types: Cigarettes    Quit date: 11/13/1988    Years since quitting: 31.6  . Smokeless tobacco: Never Used  Substance Use Topics  . Alcohol use: No  . Drug use: No    Review of Systems Constitutional: No fever/chills Eyes: No visual changes. ENT: No sore throat. Cardiovascular: Denies chest pain. Respiratory: Denies shortness of breath. Gastrointestinal: No abdominal pain.  No nausea, no vomiting.  No diarrhea.   Genitourinary: Negative for dysuria. Musculoskeletal: Negative for back pain. Skin: Negative for rash. Neurological: Negative for headaches, focal weakness or  numbness.  ____________________________________________   PHYSICAL EXAM:  VITAL SIGNS: ED Triage Vitals  Enc Vitals Group     BP 07/06/20 1647 (!) 141/60     Pulse Rate 07/06/20 1647 96     Resp 07/06/20 1647 18     Temp 07/06/20 1647 98.3 F (36.8 C)     Temp Source 07/06/20 1647 Oral     SpO2 07/06/20 1647 94 %     Weight 07/06/20 1648 205 lb (93 kg)     Height 07/06/20 1648 5\' 4"  (1.626 m)     Head Circumference --      Peak Flow --      Pain Score 07/06/20 1811 0   Constitutional: Alert and oriented.  Eyes: Conjunctivae are normal.  ENT      Head: Normocephalic and atraumatic.      Nose: No congestion/rhinnorhea.      Mouth/Throat: Mucous membranes are moist.      Neck: No stridor. Hematological/Lymphatic/Immunilogical: No cervical lymphadenopathy. Cardiovascular: Normal rate, regular rhythm.  No murmurs, rubs, or gallops. Respiratory: Normal respiratory effort without tachypnea nor retractions. Breath sounds are clear and equal bilaterally. No wheezes/rales/rhonchi. Gastrointestinal: Soft and non tender. No rebound. No guarding.  Genitourinary: Deferred Musculoskeletal: Right leg with edema. No erythema. Slight bruising to dorsum of foot. Non tender. DP 2+ Neurologic:  Normal speech and language. No gross focal neurologic deficits are appreciated.  Skin:  Skin is warm, dry and intact. No rash noted. Psychiatric: Mood and affect are normal. Speech and behavior are normal. Patient exhibits appropriate insight and judgment.  ____________________________________________    LABS (pertinent positives/negatives)  CBC wbc 13.0, hgb 8.4, plt 230 BMP na 135, k 3.3, glu 145, cr 2.81  ____________________________________________   EKG  I, Nance Pear, attending physician, personally viewed and interpreted this EKG  EKG Time: 1643 Rate: 91 Rhythm: sinus rhythm Axis: left axis deviation Intervals: qtc 442 QRS: LVH ST changes: no st elevation Impression:  abnormal ekg  ____________________________________________    RADIOLOGY  Right LE [place] No DVT  ____________________________________________   PROCEDURES  Procedures  ____________________________________________   INITIAL IMPRESSION / ASSESSMENT AND PLAN / ED COURSE  Pertinent labs & imaging results that were available during my care of the patient were reviewed by me and considered in my medical decision making (see chart for details).   Patient presented to the emergency department today because of concern for right leg swelling. The patient had US performed which did not show DVT. Exam  is not consistent with cellulitis. At this time wonder if symptoms are secondary to venous insufficiency. Additionally patient had concern about weakness and difficulty with ambulation. Did inquire if patient was interested in rehab however she stated she would like to be discharged home. Will plan on discharging home however will order home health and PT.   ____________________________________________   FINAL CLINICAL IMPRESSION(S) / ED DIAGNOSES  Final diagnoses:  Peripheral edema     Note: This dictation was prepared with Dragon dictation. Any transcriptional errors that result from this process are unintentional     Nance Pear, MD 07/06/20 2100

## 2020-07-09 ENCOUNTER — Encounter: Payer: Self-pay | Admitting: Nephrology

## 2020-07-09 LAB — SURGICAL PATHOLOGY

## 2020-07-14 ENCOUNTER — Other Ambulatory Visit: Payer: Self-pay

## 2020-07-14 ENCOUNTER — Encounter: Payer: Medicare Other | Attending: Internal Medicine | Admitting: Internal Medicine

## 2020-07-14 ENCOUNTER — Other Ambulatory Visit (INDEPENDENT_AMBULATORY_CARE_PROVIDER_SITE_OTHER): Payer: Self-pay | Admitting: Vascular Surgery

## 2020-07-14 DIAGNOSIS — E11621 Type 2 diabetes mellitus with foot ulcer: Secondary | ICD-10-CM | POA: Diagnosis present

## 2020-07-14 DIAGNOSIS — N186 End stage renal disease: Secondary | ICD-10-CM | POA: Insufficient documentation

## 2020-07-14 DIAGNOSIS — L97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 with necrosis of muscle: Secondary | ICD-10-CM | POA: Insufficient documentation

## 2020-07-14 DIAGNOSIS — I82C11 Acute embolism and thrombosis of right internal jugular vein: Secondary | ICD-10-CM

## 2020-07-14 DIAGNOSIS — Z992 Dependence on renal dialysis: Secondary | ICD-10-CM | POA: Insufficient documentation

## 2020-07-14 NOTE — Progress Notes (Signed)
LEVEDA, KENDRIX ([id number]) Visit Report for 07/14/2020 Abuse/Suicide Risk Screen Details [name], [name] Date of Service: 07/14/2020 2:45 PM Medical Record Number: [id number] Patient Account Number: [id number] Date of Birth/Sex: [birth year] (85 y.o. F) Treating RN: Army Melia Primary Care Erich Kochan: Fulton Reek Other Clinician: Referring Aviel Davalos: Fulton Reek Treating Aydyn Testerman/Extender: Tito Dine in Treatment: 0 Abuse/Suicide Risk Screen Items Answer ABUSE RISK SCREEN: Has anyone close to you tried to hurt or harm you recentlyo No Do you feel uncomfortable with anyone in your familyo No Has anyone forced you do things that you didnot want to doo No Electronic Signature(s) Signed: 07/14/2020 4:34:44 PM By: Army Melia Entered By: Army Melia on 07/14/2020 15:11:48 [name] ([id number]) -------------------------------------------------------------------------------- Activities of Daily Living Details [name] Date of Service: 07/14/2020 2:45 PM Medical Record Number: [id number] Patient Account Number: [id number] Date of Birth/Sex: 1933/11/08 (85 y.o. F) Treating RN: Army Melia Primary Care Trice Aspinall: Fulton Reek Other Clinician: Referring Anilah Huck: Fulton Reek Treating Reiko Vinje/Extender: Tito Dine in Treatment: 0 Activities of Daily Living Items Answer Activities of Daily Living (Please select one for each item) Drive Automobile Not Able Take Medications Completely Able Use Telephone Completely Able Care for Appearance Need Assistance Use Toilet Need Assistance Bath / Shower Need Assistance Dress Self Need Assistance Feed Self Completely Able Walk Need Assistance Get In / Out Bed Need Assistance Housework Need Assistance Prepare Meals Need Assistance Handle Money Completely Able Shop for Self Need Assistance Electronic Signature(s) Signed: 07/14/2020 4:34:44 PM By:  Army Melia Entered By: Army Melia on 07/14/2020 15:12:08 [name] ([id number]) -------------------------------------------------------------------------------- Education Screening Details [name] Date of Service: 07/14/2020 2:45 PM Medical Record Number: [id number] Patient Account Number: [id number] Date of Birth/Sex: [birth year] (85 y.o. F) Treating RN: Army Melia Primary Care Diavion Labrador: Fulton Reek Other Clinician: Referring Maribella Kuna: Fulton Reek Treating Tarin Johndrow/Extender: Tito Dine in Treatment: 0 Primary Learner Assessed: Patient Learning Preferences/Education Level/Primary Language Learning Preference: Explanation, Demonstration Highest Education Level: High School Preferred Language: English Cognitive Barrier Language Barrier: No Translator Needed: No Memory Deficit: No Emotional Barrier: No Cultural/Religious Beliefs Affecting Medical Care: No Physical Barrier Impaired Vision: No Impaired Hearing: No Decreased Hand dexterity: No Knowledge/Comprehension Knowledge Level: High Comprehension Level: High Ability to understand written instructions: High Ability to understand verbal instructions: High Motivation Anxiety Level: Calm Cooperation: Cooperative Education Importance: Acknowledges Need Interest in Health Problems: Asks Questions Perception: Coherent Willingness to Engage in Self-Management High Activities: Readiness to Engage in Self-Management High Activities: Electronic Signature(s) Signed: 07/14/2020 4:34:44 PM By: Army Melia Entered By: Army Melia on 07/14/2020 15:12:25 [name] ([id number]) -------------------------------------------------------------------------------- Fall Risk Assessment Details [name] Date of Service: 07/14/2020 2:45 PM Medical Record Number: [id number] Patient Account Number: [id number] Date of Birth/Sex: 07/18/1934 (85 y.o.  F) Treating RN: Army Melia Primary Care Malie Kashani: Fulton Reek Other Clinician: Referring Madylin Fairbank: Fulton Reek Treating Sulay Brymer/Extender: Tito Dine in Treatment: 0 Fall Risk Assessment Items Have you had 2 or more falls in the last 12 monthso 0 Yes Have you had any fall that resulted in injury in the last 12 monthso 0 No FALLS RISK SCREEN History of falling - immediate or within 3 months 25 Yes Secondary diagnosis (Do you have 2 or more medical diagnoseso) 0 No Ambulatory aid None/bed rest/wheelchair/nurse 0 No Crutches/cane/walker 15 Yes Furniture 0 No Intravenous therapy Access/Saline/Heparin Lock 0 No Gait/Transferring Normal/ bed rest/ wheelchair 0 No Weak (short steps  with or without shuffle, stooped but able to lift head while walking, may 10 Yes seek support from furniture) Impaired (short steps with shuffle, may have difficulty arising from chair, head down, impaired 0 No balance) Mental Status Oriented to own ability 0 No Electronic Signature(s) Signed: 07/14/2020 4:34:44 PM By: Army Melia Entered By: Army Melia on 07/14/2020 15:12:46 [name] ([id number]) -------------------------------------------------------------------------------- Foot Assessment Details [name] Date of Service: 07/14/2020 2:45 PM Medical Record Number: [id number] Patient Account Number: [id number] Date of Birth/Sex: [birth year] (85 y.o. F) Treating RN: Army Melia Primary Care Kimiyo Carmicheal: Fulton Reek Other Clinician: Referring Kimoni Pagliarulo: Fulton Reek Treating Analysse Quinonez/Extender: Tito Dine in Treatment: 0 Foot Assessment Items Site Locations + = Sensation present, - = Sensation absent, C = Callus, U = Ulcer R = Redness, W = Warmth, M = Maceration, PU = Pre-ulcerative lesion F = Fissure, S = Swelling, D = Dryness Assessment Right: Left: Other Deformity: No No Prior Foot Ulcer: No No Prior Amputation: No  No Charcot Joint: No No Ambulatory Status: Ambulatory With Help Assistance Device: Walker Gait: Steady Electronic Signature(s) Signed: 07/14/2020 4:34:44 PM By: Army Melia Entered By: Army Melia on 07/14/2020 15:13:52 [name] ([id number]) -------------------------------------------------------------------------------- Nutrition Risk Screening Details [name] Date of Service: 07/14/2020 2:45 PM Medical Record Number: [id number] Patient Account Number: [id number] Date of Birth/Sex: 1934/03/12 (85 y.o. F) Treating RN: Army Melia Primary Care Dell Briner: Fulton Reek Other Clinician: Referring Anayeli Arel: Fulton Reek Treating Willer Osorno/Extender: Tito Dine in Treatment: 0 Height (in): 64 Weight (lbs): 205 Body Mass Index (BMI): 35.2 Nutrition Risk Screening Items Score Screening NUTRITION RISK SCREEN: I have an illness or condition that made me change the kind and/or amount of food I eat 0 No I eat fewer than two meals per day 0 No I eat few fruits and vegetables, or milk products 0 No I have three or more drinks of beer, liquor or wine almost every day 0 No I have tooth or mouth problems that make it hard for me to eat 0 No I don't always have enough money to buy the food I need 0 No I eat alone most of the time 0 No I take three or more different prescribed or over-the-counter drugs a day 0 No Without wanting to, I have lost or gained 10 pounds in the last six months 0 No I am not always physically able to shop, cook and/or feed myself 0 No Nutrition Protocols Good Risk Protocol 0 No interventions needed Moderate Risk Protocol High Risk Proctocol Risk Level: Good Risk Score: 0 Electronic Signature(s) Signed: 07/14/2020 4:34:44 PM By: Army Melia Entered By: Army Melia on 07/14/2020 15:12:50

## 2020-07-16 ENCOUNTER — Ambulatory Visit (INDEPENDENT_AMBULATORY_CARE_PROVIDER_SITE_OTHER): Payer: Medicare Other | Admitting: Vascular Surgery

## 2020-07-16 ENCOUNTER — Other Ambulatory Visit: Payer: Self-pay

## 2020-07-16 ENCOUNTER — Encounter (INDEPENDENT_AMBULATORY_CARE_PROVIDER_SITE_OTHER): Payer: Self-pay | Admitting: Vascular Surgery

## 2020-07-16 ENCOUNTER — Other Ambulatory Visit
Admission: RE | Admit: 2020-07-16 | Discharge: 2020-07-16 | Disposition: A | Discharge status: Home / Self Care | Payer: Medicare Other | Source: Ambulatory Visit | Attending: Internal Medicine | Admitting: Internal Medicine

## 2020-07-16 ENCOUNTER — Ambulatory Visit (INDEPENDENT_AMBULATORY_CARE_PROVIDER_SITE_OTHER): Payer: Medicare Other

## 2020-07-16 VITALS — BP 117/62 | HR 82 | Resp 16

## 2020-07-16 DIAGNOSIS — E782 Mixed hyperlipidemia: Secondary | ICD-10-CM

## 2020-07-16 DIAGNOSIS — B999 Unspecified infectious disease: Secondary | ICD-10-CM | POA: Diagnosis present

## 2020-07-16 DIAGNOSIS — E1159 Type 2 diabetes mellitus with other circulatory complications: Secondary | ICD-10-CM | POA: Diagnosis not present

## 2020-07-16 DIAGNOSIS — I6523 Occlusion and stenosis of bilateral carotid arteries: Secondary | ICD-10-CM

## 2020-07-16 DIAGNOSIS — I82C11 Acute embolism and thrombosis of right internal jugular vein: Secondary | ICD-10-CM

## 2020-07-16 DIAGNOSIS — I10 Essential (primary) hypertension: Secondary | ICD-10-CM | POA: Diagnosis not present

## 2020-07-16 DIAGNOSIS — I639 Cerebral infarction, unspecified: Secondary | ICD-10-CM

## 2020-07-16 DIAGNOSIS — N186 End stage renal disease: Secondary | ICD-10-CM | POA: Insufficient documentation

## 2020-07-16 DIAGNOSIS — E11621 Type 2 diabetes mellitus with foot ulcer: Secondary | ICD-10-CM | POA: Diagnosis not present

## 2020-07-16 DIAGNOSIS — Z992 Dependence on renal dialysis: Secondary | ICD-10-CM

## 2020-07-16 NOTE — Assessment & Plan Note (Signed)
We performed a carotid duplex today which correlates much more with her catheter-based angiogram findings and would be in the 40 to 59% range bilaterally.  Particularly given her extensive medical comorbidities, no intervention or surgery would be recommended at this time.  Continue current medical regimen.  Recheck in 6 months.

## 2020-07-16 NOTE — Assessment & Plan Note (Signed)
I have messaged her nephrologist today to get their input.  And less she is expected to have significant renal function return, I think we should probably go ahead and consider evaluating her for an AV fistula.  I will wait to get there response and if they feel proceeding with fistula placement is prudent, vein mapping can be done at her convenience.

## 2020-07-16 NOTE — Progress Notes (Signed)
MRN : [id number]  [name] is a 86 y.o. ([birth year]) female who presents with chief complaint of  Chief Complaint  Patient presents with  . Follow-up    [facility] 4wk carotid follow up  .  History of Present Illness: Patient returns today in follow up of her carotid disease and her renal failure.  She is doing okay today.  She has now had 7 dialysis treatments.  She may feel a little better, but still does not feel well.  Dialysis really wipes her out.  She has not had any further focal neurologic symptoms.  We performed a carotid duplex today which correlates much more with her catheter-based angiogram findings and would be in the 40 to 59% range bilaterally.  Current Outpatient Medications  Medication Sig Dispense Refill  . ALPRAZolam (XANAX) 0.5 MG tablet Take 0.5 mg by mouth 2 (two) times daily as needed for anxiety.     Marland Kitchen amLODipine (NORVASC) 5 MG tablet Take 1 tablet (5 mg total) by mouth daily. 30 tablet 1  . aspirin 81 MG chewable tablet Chew by mouth.    . calcium-vitamin D (OSCAL WITH D) 500-200 MG-UNIT tablet Take 1 tablet by mouth daily.    . cetirizine (ZYRTEC) 10 MG tablet Take 10 mg by mouth daily.    . clopidogrel (PLAVIX) 75 MG tablet Take by mouth.    . Cyanocobalamin 1000 MCG SUBL Take 1,000 mcg by mouth daily.     . famotidine (PEPCID) 20 MG tablet Take 1 tablet (20 mg total) by mouth 2 (two) times daily. 60 tablet 1  . gabapentin (NEURONTIN) 100 MG capsule Take 100 mg by mouth 2 (two) times daily.    Marland Kitchen glimepiride (AMARYL) 4 MG tablet Take 4 mg by mouth daily with breakfast.     . hydrALAZINE (APRESOLINE) 25 MG tablet Take by mouth.    Marland Kitchen HYDROcodone-acetaminophen (NORCO/VICODIN) 5-325 MG tablet Take 1 tablet by mouth every 6 (six) hours as needed for moderate pain.    Marland Kitchen levothyroxine (SYNTHROID, LEVOTHROID) 150 MCG tablet Take 150 mcg daily before breakfast by mouth.    . metoprolol tartrate (LOPRESSOR) 50 MG tablet Take 50 mg by mouth in the morning, at noon,  and at bedtime.    Marland Kitchen omeprazole (PRILOSEC) 20 MG capsule Take 1 capsule (20 mg total) by mouth daily. 30 capsule 0  . ondansetron (ZOFRAN-ODT) 4 MG disintegrating tablet Take by mouth.    . predniSONE (DELTASONE) 20 MG tablet Take 1 tablet (20 mg total) by mouth 2 (two) times daily with a meal. 60 tablet 0  . promethazine (PHENERGAN) 12.5 MG tablet Take by mouth.    . triamcinolone cream (KENALOG) 0.5 % Apply topically 2 (two) times daily.     No current facility-administered medications for this visit.    Past Medical History:  Diagnosis Date  . Angina pectoris ([place]) 08/26/2012  . Arthritis   . Breast cancer ([place]) 2012   right breast  . Breast mass, right   . Cancer [facility])    breast cancer, right side 2012  . Chronic kidney disease    RENAL INSUFF  . Complication of anesthesia   . Coronary artery disease 08/22/2012   sees Dr Rockey Situ  . Diabetes ([place])   . Dyspnea    ON EXERTION  . Gastritis    hx of  . GERD (gastroesophageal reflux disease)   . Headache(784.0)    migraines  . History of breast cancer    39 treatments  of radiation. Negative chemo.  Marland Kitchen History of seasonal allergies   . Hyperlipemia   . Hypertension    sees Dr. Fulton Reek  . Hypothyroidism   . Kidney disease, chronic, stage IV (GFR 15-29 ml/min) (H[facility])   . Macular degeneration    Bilateral  . Macular degeneration 2018  . Neuropathy   . Personal history of radiation therapy   . Pneumonia    hx of  . PONV (postoperative nausea and vomiting)   . 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 Stroke ([place])    2012  . Thyroid disease   . Vertigo     Past Surgical History:  Procedure Laterality Date  . ABDOMINAL HYSTERECTOMY    . APPENDECTOMY    . BACK SURGERY    . BREAST BIOPSY Right    2012 positive- [facility]  . BREAST LUMPECTOMY Right 2012   F/U radiation   . BREAST SURGERY    . CARDIAC CATHETERIZATION  2014  . CAROTID PTA/STENT  INTERVENTION Left 06/14/2020   Procedure: CAROTID PTA/STENT INTERVENTION;  Surgeon: Algernon Huxley, MD;  Location: [facility];  Service: Cardiovascular;  Laterality: Left;  . CATARACT EXTRACTION W/PHACO Right 08/14/2017   Procedure: CATARACT EXTRACTION PHACO AND INTRAOCULAR LENS PLACEMENT (IOC);  Surgeon: Birder Robson, MD;  Location: [facility];  Service: Ophthalmology;  Laterality: Right;  [place] 00:43.0 AP% 17.1 CDE 7.37 Fluid Pack lot # [id number]  . CATARACT EXTRACTION W/PHACO Left 10/09/2017   Procedure: CATARACT EXTRACTION PHACO AND INTRAOCULAR LENS PLACEMENT ([place]);  Surgeon: Birder Robson, MD;  Location: [facility];  Service: Ophthalmology;  Laterality: Left;  [place] 00:30.3 AP% 11.0 CDE 3.33 Fluid Pack Lot # [id number]  . CORONARY ARTERY BYPASS GRAFT  09/05/2012   Procedure: CORONARY ARTERY BYPASS GRAFTING (CABG);  Surgeon: Rexene [place]ts, MD;  Location: [facility];  Service: Open Heart Surgery;  Laterality: N/A;  CABG x four, using left internal mammary artery and bilateral greater saphenous vein harvested endoscopically  . DIALYSIS/PERMA CATHETER INSERTION Left 06/25/2020   Procedure: DIALYSIS/PERMA CATHETER INSERTION;  Surgeon: Algernon Huxley, MD;  Location: [facility];  Service: Cardiovascular;  Laterality: Left;  . INTRAOPERATIVE TRANSESOPHAGEAL ECHOCARDIOGRAM  09/05/2012   Procedure: INTRAOPERATIVE TRANSESOPHAGEAL ECHOCARDIOGRAM;  Surgeon: Rexene [place], MD;  Location: [facility];  Service: Open Heart Surgery;  Laterality: N/A;  . KNEE SURGERY     Bilateral nerve block  . LAPAROSCOPIC NISSEN FUNDOPLICATION    . OVARIAN CYST REMOVAL       Social History   Tobacco Use  . Smoking status: Former Smoker    Types: Cigarettes    Quit date: 11/13/1988    Years since quitting: 31.6  . Smokeless tobacco: Never Used  Substance Use Topics  . Alcohol use: No  . Drug use: No      Family History  Problem Relation Age of Onset  . Heart disease Brother        CABG & stents  .  Hyperlipidemia Brother   . Hypertension Brother   . Cancer Father   . Heart disease Mother   . Breast cancer Cousin   . Kidney disease Neg Hx     Allergies  Allergen Reactions  . Clonidine Other (See Comments) and Shortness Of Breath    Other reaction(s): Other (see comments) Other reaction(s): Other (See Comments)  . Darvocet [Propoxyphene N-Acetaminophen] Anaphylaxis  . Hydralazine  Other reaction(s): Other (See Comments) glomerulonephritis  . Hydralazine Hcl Other (See Comments)    glomerulonephritis  . Esomeprazole     Other reaction(s): Unknown  . Guaifenesin     Other reaction(s): Unknown Other reaction(s): Unknown Other reaction(s): Unknown Other reaction(s): Unknown  . Phenylephrine-Guaifenesin     Other reaction(s): Unknown  . Rabeprazole     Other reaction(s): Unknown  . Telithromycin     Other reaction(s): Unknown Other reaction(s): Unknown Other reaction(s): Unknown  . Fluocinolone Other (See Comments)    Feels crazy Other reaction(s): Other (see comments) Other reaction(s): Other (See Comments) Feels crazy Feels crazy  . Iodinated Diagnostic Agents Nausea And Vomiting and Other (See Comments)    Burning Other reaction(s): Other (see comments) Other reaction(s): Other (See Comments) Burning Nausea & vomiting  . Levaquin [Levofloxacin] Nausea Only  . Statins Other (See Comments)    Myalgia Other reaction(s): Other (See Comments), Unknown, Unknown Myalgia    REVIEW OF SYSTEMS (Negative unless checked)  Constitutional: [] ?Weight loss  [] ?Fever  [] ?Chills Cardiac: [] ?Chest pain   [] ?Chest pressure   [] ?Palpitations   [] ?Shortness of breath when laying flat   [] ?Shortness of breath at rest   [x] ?Shortness of breath with exertion. Vascular:  [] ?Pain in legs with walking   [] ?Pain in legs at rest   [] ?Pain in legs when laying flat   [] ?Claudication   [] ?Pain in feet when walking  [] ?Pain in feet at rest  [] ?Pain in feet when laying flat   [] ?History  of DVT   [] ?Phlebitis   [] ?Swelling in legs   [] ?Varicose veins   [] ?Non-healing ulcers Pulmonary:   [] ?Uses home oxygen   [] ?Productive cough   [] ?Hemoptysis   [] ?Wheeze  [] ?COPD   [] ?Asthma Neurologic:  [] ?Dizziness  [] ?Blackouts   [] ?Seizures   [x] ?History of stroke   [] ?History of TIA  [] ?Aphasia   [] ?Temporary blindness   [] ?Dysphagia   [] ?Weakness or numbness in arms   [] ?Weakness or numbness in legs Musculoskeletal:  [x] ?Arthritis   [] ?Joint swelling   [x] ?Joint pain   [] ?Low back pain Hematologic:  [x] ?Easy bruising  [] ?Easy bleeding   [] ?Hypercoagulable state   [] ?Anemic  [] ?Hepatitis Gastrointestinal:  [] ?Blood in stool   [] ?Vomiting blood  [x] ?Gastroesophageal reflux/heartburn   [] ?Abdominal pain Genitourinary:  [x] ?Chronic kidney disease   [] ?Difficult urination  [] ?Frequent urination  [] ?Burning with urination   [] ?Hematuria Skin:  [] ?Rashes   [] ?Ulcers   [] ?Wounds Psychological:  [] ?History of anxiety   [] ? History of major depression.  Physical Examination  BP 117/62 (BP Location: Left Arm)   Pulse 82   Resp 16  Gen:  WD/WN, NAD Head: [place]/AT, No temporalis wasting. Ear/Nose/Throat: Hearing grossly intact, nares w/o erythema or drainage Eyes: Conjunctiva clear. Sclera non-icteric Neck: Supple.  Trachea midline Pulmonary:  Good air movement, no use of accessory muscles.  Cardiac: RRR, no JVD Vascular: Right jugular PermCath in place with no erythema or drainage Vessel Right Left  Radial Palpable Palpable               Musculoskeletal: M/S 5/5 throughout.  No deformity or atrophy.  1-2+ bilateral lower extremity edema.  In a wheelchair today Neurologic: Sensation grossly intact in extremities.  Symmetrical.  Speech is fluent.  Psychiatric: Judgment intact, Mood & affect appropriate for pt's clinical situation. Dermatologic: No rashes or ulcers noted.  No cellulitis or open wounds.       Labs Recent Results (from the past 2160 hour(s))  Protime-INR     Status: None  Collection Time: 04/23/20  2:38 PM  Result Value Ref Range   Prothrombin Time 12.2 11.4 - 15.2 seconds   INR 0.9 0.8 - 1.2    Comment: (NOTE) INR goal varies based on device and disease states. Performed at [facility], [address] [place], [place] [address]   APTT     Status: None   Collection Time: 04/23/20  2:38 PM  Result Value Ref Range   aPTT 36 24 - 36 seconds    Comment: Performed at [facility], [address] [place], [place] [address]  CBC     Status: Abnormal   Collection Time: 04/23/20  2:38 PM  Result Value Ref Range   WBC 7.6 4.0 - 10.5 K/uL   RBC 3.78 (L) 3.87 - 5.11 MIL/uL   Hemoglobin 10.7 (L) 12.0 - 15.0 g/dL   HCT 33.1 (L) 36 - 46 %   MCV 87.6 80.0 - 100.0 fL   MCH 28.3 26.0 - 34.0 pg   MCHC 32.3 30.0 - 36.0 g/dL   RDW 15.0 11.5 - 15.5 %   Platelets 171 150 - 400 K/uL   nRBC 0.0 0.0 - 0.2 %    Comment: Performed at [facility], [address] [place], [place] [address]  Differential     Status: Abnormal   Collection Time: 04/23/20  2:38 PM  Result Value Ref Range   Neutrophils Relative % 75 %   Neutro Abs 5.7 1.7 - 7.7 K/uL   Lymphocytes Relative 12 %   Lymphs Abs 0.9 0.7 - 4.0 K/uL   Monocytes Relative 7 %   Monocytes Absolute 0.5 0.1 - 1.0 K/uL   Eosinophils Relative 3 %   Eosinophils Absolute 0.2 0.0 - 0.5 K/uL   Basophils Relative 1 %   Basophils Absolute 0.1 0.0 - 0.1 K/uL   Immature Granulocytes 2 %   Abs Immature Granulocytes 0.16 (H) 0.00 - 0.07 K/uL    Comment: Performed at [facility], [address] [place], [place] [address]  Comprehensive metabolic panel     Status: Abnormal   Collection Time: 04/23/20  2:38 PM  Result Value Ref Range   Sodium 137 135 - 145 mmol/L   Potassium 4.2 3.5 - 5.1 mmol/L   Chloride 102 98 - 111 mmol/L   CO2 23 22 - 32 mmol/L   Glucose, Bld 132 (H) 70 - 99 mg/dL    Comment: Glucose reference range applies only to samples taken after fasting for at least 8  hours.   BUN 45 (H) 8 - 23 mg/dL   Creatinine, Ser 3.40 (H) 0.44 - 1.00 mg/dL   Calcium 8.3 (L) 8.9 - 10.3 mg/dL   Total Protein 8.5 (H) 6.5 - 8.1 g/dL   Albumin 3.3 (L) 3.5 - 5.0 g/dL   AST 11 (L) 15 - 41 U/L   ALT 8 0 - 44 U/L   Alkaline Phosphatase 81 38 - 126 U/L   Total Bilirubin 0.8 0.3 - 1.2 mg/dL   GFR calc non Af Amer 12 (L) >60 mL/min   GFR calc Af Amer 14 (L) >60 mL/min   Anion gap 12 5 - 15    Comment: Performed at [facility], [address] [place], [place] [address]  SARS CORONAVIRUS 2 (TAT 6-24 HRS) Nasopharyngeal Nasopharyngeal Swab     Status: None   Collection Time: 06/10/20 12:09 PM   Specimen: Nasopharyngeal Swab  Result Value Ref Range   SARS Coronavirus 2 NEGATIVE  NEGATIVE    Comment: (NOTE) SARS-CoV-2 target nucleic acids are NOT DETECTED.  The SARS-CoV-2 RNA is generally detectable in upper and lower respiratory specimens during the acute phase of infection. Negative results do not preclude SARS-CoV-2 infection, do not rule out co-infections with other pathogens, and should not be used as the sole basis for treatment or other patient management decisions. Negative results must be combined with clinical observations, patient history, and epidemiological information. The expected result is Negative.  Fact Sheet for Patients: SugarRoll.be  Fact Sheet for Healthcare Providers: https://www.woods-mathews.com/  This test is not yet approved or cleared by the [place] FDA and  has been authorized for detection and/or diagnosis of SARS-CoV-2 by FDA under an Emergency Use Authorization (EUA). This EUA will remain  in effect (meaning this test can be used) for the duration of the COVID-19 declaration under Se ction 564(b)(1) of the Act, 21 U.S.C. section 360bbb-3(b)(1), unless the authorization is terminated or revoked sooner.  Performed at [facility], [place] [address], [place],  [place] [address]   BUN     Status: Abnormal   Collection Time: 06/14/20  9:42 AM  Result Value Ref Range   BUN 57 (H) 8 - 23 mg/dL    Comment: Performed at [facility], [address] [place], [place] [address]  Creatinine, serum     Status: Abnormal   Collection Time: 06/14/20  9:42 AM  Result Value Ref Range   Creatinine, Ser 4.49 (H) 0.44 - 1.00 mg/dL   GFR calc non Af Amer 8 (L) >60 mL/min   GFR calc Af Amer 10 (L) >60 mL/min    Comment: Performed at [facility], [address] [place], [place] [address]  Glucose, capillary     Status: Abnormal   Collection Time: 06/14/20 10:06 AM  Result Value Ref Range   Glucose-Capillary 60 (L) 70 - 99 mg/dL    Comment: Glucose reference range applies only to samples taken after fasting for at least 8 hours.  Glucose, capillary     Status: Abnormal   Collection Time: 06/14/20 11:21 AM  Result Value Ref Range   Glucose-Capillary 126 (H) 70 - 99 mg/dL    Comment: Glucose reference range applies only to samples taken after fasting for at least 8 hours.  Comprehensive metabolic panel     Status: Abnormal   Collection Time: 06/22/20  2:12 PM  Result Value Ref Range   Sodium 135 135 - 145 mmol/L   Potassium 5.6 (H) 3.5 - 5.1 mmol/L   Chloride 100 98 - 111 mmol/L   CO2 25 22 - 32 mmol/L   Glucose, Bld 161 (H) 70 - 99 mg/dL    Comment: Glucose reference range applies only to samples taken after fasting for at least 8 hours.   BUN 64 (H) 8 - 23 mg/dL   Creatinine, Ser 5.15 (H) 0.44 - 1.00 mg/dL   Calcium 7.6 (L) 8.9 - 10.3 mg/dL   Total Protein 7.9 6.5 - 8.1 g/dL   Albumin 3.1 (L) 3.5 - 5.0 g/dL   AST 10 (L) 15 - 41 U/L   ALT 6 0 - 44 U/L   Alkaline Phosphatase 78 38 - 126 U/L   Total Bilirubin 0.7 0.3 - 1.2 mg/dL   GFR calc non Af Amer 7 (L) >60 mL/min   GFR calc Af Amer 8 (L) >60 mL/min   Anion gap 10 5 - 15    Comment: Performed at [facility], [address] [place], [place] [address]  CBC     Status:  Abnormal   Collection Time: 06/22/20  2:12 PM  Result Value Ref Range   WBC 7.2 4.0 - 10.5 K/uL   RBC 2.83 (L) 3.87 - 5.11 MIL/uL   Hemoglobin 8.2 (L) 12.0 - 15.0 g/dL   HCT 25.5 (L) 36 - 46 %   MCV 90.1 80.0 - 100.0 fL   MCH 29.0 26.0 - 34.0 pg   MCHC 32.2 30.0 - 36.0 g/dL   RDW 15.9 (H) 11.5 - 15.5 %   Platelets 181 150 - 400 K/uL   nRBC 0.0 0.0 - 0.2 %    Comment: Performed at [facility], [address] [place], [place] [address]  Hemoglobin A1c     Status: None   Collection Time: 06/22/20  2:12 PM  Result Value Ref Range   Hgb A1c MFr Bld 5.3 4.8 - 5.6 %    Comment: (NOTE) Pre diabetes:          5.7%-6.4%  Diabetes:              >6.4%  Glycemic control for   <7.0% adults with diabetes    Mean Plasma Glucose 105.41 mg/dL    Comment: Performed at [facility] [address], [place], [place] [address]  Iron and TIBC     Status: None   Collection Time: 06/22/20  2:12 PM  Result Value Ref Range   Iron 33 28 - 170 ug/dL   TIBC 302 250 - 450 ug/dL   Saturation Ratios 11 10.4 - 31.8 %   UIBC 269 ug/dL    Comment: Performed at [facility], [address] [place], [place] [address]  Ferritin     Status: None   Collection Time: 06/22/20  2:12 PM  Result Value Ref Range   Ferritin 56 11 - 307 ng/mL    Comment: Performed at [facility], [address] [place], [place] [address]  SARS Coronavirus 2 by RT PCR (hospital order, performed in [facility] hospital lab) Nasopharyngeal Nasopharyngeal Swab     Status: None   Collection Time: 06/22/20  4:33 PM   Specimen: Nasopharyngeal Swab  Result Value Ref Range   SARS Coronavirus 2 NEGATIVE NEGATIVE    Comment: (NOTE) SARS-CoV-2 target nucleic acids are NOT DETECTED.  The SARS-CoV-2 RNA is generally detectable in upper and lower respiratory specimens during the acute phase of infection. The lowest concentration of SARS-CoV-2 viral copies this assay can detect is 250 copies / mL. A  negative result does not preclude SARS-CoV-2 infection and should not be used as the sole basis for treatment or other patient management decisions.  A negative result may occur with improper specimen collection / handling, submission of specimen other than nasopharyngeal swab, presence of viral mutation(s) within the areas targeted by this assay, and inadequate number of viral copies (<250 copies / mL). A negative result must be combined with clinical observations, patient history, and epidemiological information.  Fact Sheet for Patients:   StrictlyIdeas.no  Fact Sheet for Healthcare Providers: BankingDealers.co.za  This test is not yet approved or  cleared by the [place] FDA and has been authorized for detection and/or diagnosis of SARS-CoV-2 by FDA under an Emergency Use Authorization (EUA).  This EUA will remain in effect (meaning this test can be used) for the duration of the COVID-19 declaration under Section 564(b)(1) of the Act, 21 U.S.C. section 360bbb-3(b)(1), unless the authorization is  terminated or revoked sooner.  Performed at [facility], [address] [place], [place] [address]   Glucose, capillary     Status: Abnormal   Collection Time: 06/22/20  7:41 PM  Result Value Ref Range   Glucose-Capillary 105 (H) 70 - 99 mg/dL    Comment: Glucose reference range applies only to samples taken after fasting for at least 8 hours.  Basic metabolic panel     Status: Abnormal   Collection Time: 06/22/20 11:26 PM  Result Value Ref Range   Sodium 138 135 - 145 mmol/L   Potassium 4.7 3.5 - 5.1 mmol/L   Chloride 102 98 - 111 mmol/L   CO2 24 22 - 32 mmol/L   Glucose, Bld 145 (H) 70 - 99 mg/dL    Comment: Glucose reference range applies only to samples taken after fasting for at least 8 hours.   BUN 58 (H) 8 - 23 mg/dL   Creatinine, Ser 4.86 (H) 0.44 - 1.00 mg/dL   Calcium 7.8 (L) 8.9 - 10.3 mg/dL   GFR calc non  Af Amer 8 (L) >60 mL/min   GFR calc Af Amer 9 (L) >60 mL/min   Anion gap 12 5 - 15    Comment: Performed at [facility], [address] [place], [place] [address]  Hemoglobin A1c     Status: None   Collection Time: 06/23/20  4:44 AM  Result Value Ref Range   Hgb A1c MFr Bld 5.4 4.8 - 5.6 %    Comment: (NOTE) Pre diabetes:          5.7%-6.4%  Diabetes:              >6.4%  Glycemic control for   <7.0% adults with diabetes    Mean Plasma Glucose 108.28 mg/dL    Comment: Performed at [facility] [address] [place], [place] [address]  TSH     Status: None   Collection Time: 06/23/20  4:44 AM  Result Value Ref Range   TSH 3.275 0.350 - 4.500 uIU/mL    Comment: Performed by a 3rd Generation assay with a functional sensitivity of <=0.01 uIU/mL. Performed at [facility], [address] [place], [place] [address]   Basic metabolic panel     Status: Abnormal   Collection Time: 06/23/20  4:44 AM  Result Value Ref Range   Sodium 137 135 - 145 mmol/L   Potassium 4.8 3.5 - 5.1 mmol/L   Chloride 103 98 - 111 mmol/L   CO2 23 22 - 32 mmol/L   Glucose, Bld 150 (H) 70 - 99 mg/dL    Comment: Glucose reference range applies only to samples taken after fasting for at least 8 hours.   BUN 59 (H) 8 - 23 mg/dL   Creatinine, Ser 4.59 (H) 0.44 - 1.00 mg/dL   Calcium 8.0 (L) 8.9 - 10.3 mg/dL   GFR calc non Af Amer 8 (L) >60 mL/min   GFR calc Af Amer 10 (L) >60 mL/min   Anion gap 11 5 - 15    Comment: Performed at [facility], [address] [place], [place] [address]  CBC     Status: Abnormal   Collection Time: 06/23/20  4:44 AM  Result Value Ref Range   WBC 4.8 4.0 - 10.5 K/uL   RBC 2.92 (L) 3.87 - 5.11 MIL/uL   Hemoglobin 8.6 (L) 12.0 - 15.0 g/dL   HCT 25.8 (L) 36 - 46 %  MCV 88.4 80.0 - 100.0 fL   MCH 29.5 26.0 - 34.0 pg   MCHC 33.3 30.0 - 36.0 g/dL   RDW 15.6 (H) 11.5 - 15.5 %   Platelets 169 150 - 400 K/uL   nRBC 0.0 0.0 - 0.2 %     Comment: Performed at [facility], [address] [place], [place] [address]  Protime-INR     Status: None   Collection Time: 06/23/20  4:44 AM  Result Value Ref Range   Prothrombin Time 13.2 11.4 - 15.2 seconds   INR 1.0 0.8 - 1.2    Comment: (NOTE) INR goal varies based on device and disease states. Performed at [facility], [address] [place], [place] [address]   Glucose, capillary     Status: Abnormal   Collection Time: 06/23/20  7:46 AM  Result Value Ref Range   Glucose-Capillary 124 (H) 70 - 99 mg/dL    Comment: Glucose reference range applies only to samples taken after fasting for at least 8 hours.   Comment 1 Notify RN   Urinalysis, Complete w Microscopic     Status: Abnormal   Collection Time: 06/23/20 10:25 AM  Result Value Ref Range   Color, Urine RED (A) YELLOW   APPearance HAZY (A) CLEAR   Specific Gravity, Urine 1.007 1.005 - 1.030   pH  5.0 - 8.0    TEST NOT REPORTED DUE TO COLOR INTERFERENCE OF URINE PIGMENT   Glucose, UA (A) NEGATIVE mg/dL    TEST NOT REPORTED DUE TO COLOR INTERFERENCE OF URINE PIGMENT   Hgb urine dipstick (A) NEGATIVE    TEST NOT REPORTED DUE TO COLOR INTERFERENCE OF URINE PIGMENT   Bilirubin Urine (A) NEGATIVE    TEST NOT REPORTED DUE TO COLOR INTERFERENCE OF URINE PIGMENT   Ketones, ur (A) NEGATIVE mg/dL    TEST NOT REPORTED DUE TO COLOR INTERFERENCE OF URINE PIGMENT   Protein, ur (A) NEGATIVE mg/dL    TEST NOT REPORTED DUE TO COLOR INTERFERENCE OF URINE PIGMENT   Nitrite (A) NEGATIVE    TEST NOT REPORTED DUE TO COLOR INTERFERENCE OF URINE PIGMENT   Leukocytes,Ua (A) NEGATIVE    TEST NOT REPORTED DUE TO COLOR INTERFERENCE OF URINE PIGMENT   RBC / HPF >50 (H) 0 - 5 RBC/hpf   WBC, UA >50 (H) 0 - 5 WBC/hpf   Bacteria, UA MANY (A) NONE SEEN   Squamous Epithelial / LPF NONE SEEN 0 - 5    Comment: Performed at [facility], [address] [place], [place] [address]  Protein Electro, Random Urine      Status: Abnormal   Collection Time: 06/23/20 10:25 AM  Result Value Ref Range   Total Protein, Urine 436.6 Not Estab. mg/dL    Comment: (NOTE) Results confirmed on dilution.    Albumin ELP, Urine 42.1 %   Alpha-1-Globulin, U 4.4 %   Alpha-2-Globulin, U 5.3 %   Beta Globulin, U 26.4 %   Gamma Globulin, U 21.8 %   M Component, Ur 15.3 (H) Not Observed %    Comment: An additional m spike was observed at a concentration of 13.0 %    Please Note: Comment     Comment: (NOTE) Protein electrophoresis scan will follow via computer, mail, or courier delivery. Performed At: [facility] [place], [place] [id number] Rush Farmer MD QP:[id number]   Hepatitis B surface antigen     Status: None   Collection Time: 06/23/20 11:47 AM  Result Value Ref Range   Hepatitis B Surface Ag NON REACTIVE NON REACTIVE    Comment: Performed at [facility], [place] [address], [place], [place] [address]  Brain natriuretic peptide     Status: Abnormal   Collection Time: 06/23/20 11:47 AM  Result Value Ref Range   B Natriuretic Peptide 488.2 (H) 0.0 - 100.0 pg/mL    Comment: Performed at [facility], [place], [place] [address]  ANCA Titers     Status: Abnormal   Collection Time: 06/23/20 11:56 AM  Result Value Ref Range   C-ANCA <1:20 Neg:<1:20 titer   P-ANCA 1:640 (H) Neg:<1:20 titer    Comment: (NOTE) The presence of positive fluorescence exhibiting P-ANCA or C-ANCA patterns alone is not specific for the diagnosis of Wegener's Granulomatosis (WG) or microscopic polyangiitis. Decisions about treatment should not be based solely on ANCA IFA results.  The International ANCA Group Consensus recommends follow up testing of positive sera with both PR-3 and MPO-ANCA enzyme immunoassays. As many as 5% serum samples are positive only by EIA. Ref. AM J Clin Pathol 1999;111:507-513.    Atypical P-ANCA titer <1:20 Neg:<1:20 titer    Comment: (NOTE) The  atypical pANCA pattern has been observed in a significant percentage of patients with ulcerative colitis, primary sclerosing cholangitis and autoimmune hepatitis. Performed At: [facility] [place], [place] [id number] Rush Farmer MD YD:[id number]   Mpo/pr-3 (anca) antibodies     Status: Abnormal   Collection Time: 06/23/20 11:56 AM  Result Value Ref Range   Myeloperoxidase Abs <9.0 0.0 - 9.0 U/mL   ANCA Proteinase 3 48.2 (H) 0.0 - 3.5 U/mL    Comment: (NOTE) Performed At: [facility] [place], [place] [id number] Rush Farmer MD GE:[id number]   ANA w/Reflex     Status: Abnormal   Collection Time: 06/23/20 11:56 AM  Result Value Ref Range   Anti Nuclear Antibody (ANA) Positive (A) Negative    Comment: (NOTE) Performed At: [facility] [address] [place], [place] [id number] Rush Farmer MD [id number]   Glomerular basement membrane antibodies     Status: None   Collection Time: 06/23/20 11:56 AM  Result Value Ref Range   GBM Ab 2 0 - 20 units    Comment: (NOTE)                   Negative                   0 - 20                   Weak Positive             21 - 30                   Moderate to Strong Positive   >30 Performed At: [facility] [place], [place] [id number] Rush Farmer MD [id number]   C4 complement     Status: None   Collection Time: 06/23/20 11:56 AM  Result Value Ref Range   Complement C4, Body Fluid 14 12 - 38 mg/dL    Comment: (NOTE) Performed At: [facility] [place], [place] [id number] Rush Farmer MD [id number]   C3 complement     Status: Abnormal   Collection Time: 06/23/20 11:56 AM  Result Value Ref Range   C3 Complement 60 (L) 82 - 167 mg/dL    Comment: (  NOTE) Performed At: [facility] [place], [place] [id number] Rush Farmer MD PX:[id number]   Hepatitis B surface antibody,qualitative     Status:  None   Collection Time: 06/23/20 11:56 AM  Result Value Ref Range   Hep B S Ab NON REACTIVE NON REACTIVE    Comment: (NOTE) Inconsistent with immunity, less than 10 mIU/mL.  Performed at [facility], [place] [address] [place], [place] [address]   Hepatitis B core antibody, IgM     Status: None   Collection Time: 06/23/20 11:56 AM  Result Value Ref Range   Hep B C IgM NON REACTIVE NON REACTIVE    Comment: Performed at [facility] [address], [place], [place] [address]  Hepatitis C antibody     Status: None   Collection Time: 06/23/20 11:56 AM  Result Value Ref Range   HCV Ab NON REACTIVE NON REACTIVE    Comment: (NOTE) Nonreactive HCV antibody screen is consistent with no HCV infections,  unless recent infection is suspected or other evidence exists to indicate HCV infection.  Performed at [facility], [place] [address] [place], [place] [address]   QuantiFERON-TB Girtha Rm Plus     Status: None   Collection Time: 06/23/20 11:56 AM  Result Value Ref Range   QuantiFERON Incubation Incubation performed.    QuantiFERON-TB Gold Plus Negative Negative    Comment: (NOTE) Chemiluminescence immunoassay methodology Performed At: [facility] [place], [place] [id number] Rush Farmer MD [id number]   CK     Status: Abnormal   Collection Time: 06/23/20 11:56 AM  Result Value Ref Range   Total CK 26 (L) 38.0 - 234.0 U/L    Comment: Performed at [facility], [address] [place], [place] [address]  HIV Antibody (routine testing w rflx)     Status: None   Collection Time: 06/23/20 11:56 AM  Result Value Ref Range   HIV Screen 4th Generation wRfx Non Reactive Non Reactive    Comment: Performed at [facility], [place] [address], [place], [place] [address]  ENA+DNA/DS+Sjorgen's     Status: None   Collection Time: 06/23/20 11:56 AM  Result Value Ref Range   ds DNA Ab 3 0 - 9 IU/mL    Comment: (NOTE)                                    Negative      <5                                   Equivocal  5 - 9                                   Positive      >9    Ribonucleic Protein <0.2 0.0 - 0.9 AI   ENA SM Ab Ser-aCnc <0.2 0.0 - 0.9 AI   SSA (Ro) (ENA) Antibody, IgG <0.2 0.0 - 0.9 AI   SSB (La) (ENA) Antibody, IgG <0.2 0.0 - 0.9 AI   See below: Comment     Comment: (NOTE) Autoantibody                       Disease Association ------------------------------------------------------------  Condition                  Frequency ---------------------   ------------------------   --------- Antinuclear Antibody,    SLE, mixed connective Direct (ANA-D)           tissue diseases ---------------------   ------------------------   --------- dsDNA                    SLE                        40 - 60% ---------------------   ------------------------   --------- Chromatin                Drug induced SLE                90%                         SLE                        48 - 97% ---------------------   ------------------------   --------- SSA (Ro)                 SLE                        25 - 35%                         Sjogren's Syndrome         40 - 70%                         Neonatal Lupus                 100% ---------------------   ------------------------   --------- SSB (La)                 SLE                              10%                         Sjogren's Syndrome              30% ---------------------   -----------------------    --------- Sm (anti-Smith)          SLE                        15 - 30% ---------------------   -----------------------    --------- RNP                      Mixed Connective Tissue                         Disease                         95% (U1 nRNP,                SLE                        30 - 50% anti-ribonucleoprotein)  Polymyositis and/or  Dermatomyositis                 20% ---------------------   ------------------------    --------- Scl-70 (antiDNA          Scleroderma (diffuse)      20 - 35% topoisomerase)           Crest                           13% ---------------------   ------------------------   --------- Jo-1                     Polymyositis and/or                         Dermatomyositis            20 - 40% ---------------------   ------------------------   --------- Centromere B             Scleroderma -  Crest                         variant                         80% Performed At: [facility] [place], [place] [id number] Rush Farmer MD [id number]   Ronney Asters Plus     Status: None   Collection Time: 06/23/20 11:56 AM  Result Value Ref Range   QuantiFERON Criteria Comment     Comment: (NOTE) The QuantiFERON-TB Gold Plus result is determined by subtracting the Nil value from either TB antigen (Ag) tube. The mitogen tube serves as a control for the test.    QuantiFERON TB1 Ag Value 0.03 IU/mL   QuantiFERON TB2 Ag Value 0.03 IU/mL   QuantiFERON Nil Value 0.01 IU/mL   QuantiFERON Mitogen Value 0.59 IU/mL    Comment: (NOTE) Performed At: [facility] [place], [place] [id number] Rush Farmer MD YB:[id number]   Glucose, capillary     Status: Abnormal   Collection Time: 06/23/20 12:13 PM  Result Value Ref Range   Glucose-Capillary 122 (H) 70 - 99 mg/dL    Comment: Glucose reference range applies only to samples taken after fasting for at least 8 hours.   Comment 1 Notify RN   Protein electrophoresis, serum     Status: Abnormal   Collection Time: 06/23/20 12:48 PM  Result Value Ref Range   Total Protein ELP 8.0 6.0 - 8.5 g/dL   Albumin ELP 3.1 2.9 - 4.4 g/dL   Alpha-1-Globulin 0.3 0.0 - 0.4 g/dL   Alpha-2-Globulin 1.0 0.4 - 1.0 g/dL   Beta Globulin 0.7 0.7 - 1.3 g/dL   Gamma Globulin 2.9 (H) 0.4 - 1.8 g/dL   M-Spike, % 1.9 (H) Not Observed g/dL   SPE Interp. Comment     Comment: (NOTE) The SPE pattern demonstrates a single  peak (M-spike) in the gamma region which may represent monoclonal protein. This peak may also be caused by circulating immune complexes, cryoglobulins, C-reactive protein, fibrinogen or hemolysis.  If clinically indicated, the presence of a monoclonal gammopathy may be confirmed by immuno- fixation, as well as an evaluation of the urine for the presence of Bence-Jones protein. Performed At: [facility] [address] [place], [place] [id number] Rush Farmer MD [id number]    Comment Comment     Comment: [phone]  NOTE) Protein electrophoresis scan will follow via computer, mail, or courier delivery.    Globulin, Total 4.9 (H) 2.2 - 3.9 g/dL   A/G Ratio 0.6 (L) 0.7 - 1.7  Kappa/lambda light chains     Status: Abnormal   Collection Time: 06/23/20 12:48 PM  Result Value Ref Range   Kappa free light chain 2,196.6 (H) 3.3 - 19.4 mg/L   Lamda free light chains 63.4 (H) 5.7 - 26.3 mg/L   Kappa, lamda light chain ratio 34.65 (H) 0.26 - 1.65    Comment: (NOTE) Performed At: [facility] [address] [place], [place] [id number] Rush Farmer MD [id number]   Glucose, capillary     Status: Abnormal   Collection Time: 06/23/20  4:05 PM  Result Value Ref Range   Glucose-Capillary 178 (H) 70 - 99 mg/dL    Comment: Glucose reference range applies only to samples taken after fasting for at least 8 hours.   Comment 1 Notify RN   ECHOCARDIOGRAM COMPLETE     Status: None   Collection Time: 06/23/20  7:55 PM  Result Value Ref Range   BP 178/94 mmHg   S' Lateral 2.94 cm   Area-P 1/2 3.42 cm2  Glucose, capillary     Status: Abnormal   Collection Time: 06/23/20  9:08 PM  Result Value Ref Range   Glucose-Capillary 262 (H) 70 - 99 mg/dL    Comment: Glucose reference range applies only to samples taken after fasting for at least 8 hours.   Comment 1 Notify RN   Glucose, capillary     Status: Abnormal   Collection Time: 06/24/20  7:27 AM  Result Value Ref Range    Glucose-Capillary 205 (H) 70 - 99 mg/dL    Comment: Glucose reference range applies only to samples taken after fasting for at least 8 hours.   Comment 1 Notify RN   Renal function panel     Status: Abnormal   Collection Time: 06/24/20  7:40 AM  Result Value Ref Range   Sodium 133 (L) 135 - 145 mmol/L   Potassium 5.5 (H) 3.5 - 5.1 mmol/L   Chloride 99 98 - 111 mmol/L   CO2 23 22 - 32 mmol/L   Glucose, Bld 209 (H) 70 - 99 mg/dL    Comment: Glucose reference range applies only to samples taken after fasting for at least 8 hours.   BUN 73 (H) 8 - 23 mg/dL   Creatinine, Ser 5.72 (H) 0.44 - 1.00 mg/dL   Calcium 7.9 (L) 8.9 - 10.3 mg/dL   Phosphorus 6.1 (H) 2.5 - 4.6 mg/dL   Albumin 2.9 (L) 3.5 - 5.0 g/dL   GFR calc non Af Amer 6 (L) >60 mL/min   GFR calc Af Amer 7 (L) >60 mL/min   Anion gap 11 5 - 15    Comment: Performed at [facility], [address] [place], [place] [address]  CBC     Status: Abnormal   Collection Time: 06/24/20  7:40 AM  Result Value Ref Range   WBC 10.6 (H) 4.0 - 10.5 K/uL   RBC 3.02 (L) 3.87 - 5.11 MIL/uL   Hemoglobin 8.7 (L) 12.0 - 15.0 g/dL   HCT 26.9 (L) 36 - 46 %   MCV 89.1 80.0 - 100.0 fL   MCH 28.8 26.0 - 34.0 pg   MCHC 32.3 30.0 - 36.0 g/dL   RDW 15.4 11.5 - 15.5 %   Platelets 199 150 - 400 K/uL   nRBC 0.0  0.0 - 0.2 %    Comment: Performed at [facility], [address] [place], [place] [address]  Basic metabolic panel     Status: Abnormal   Collection Time: 06/24/20  7:40 AM  Result Value Ref Range   Sodium 132 (L) 135 - 145 mmol/L   Potassium 5.5 (H) 3.5 - 5.1 mmol/L   Chloride 99 98 - 111 mmol/L   CO2 23 22 - 32 mmol/L   Glucose, Bld 207 (H) 70 - 99 mg/dL    Comment: Glucose reference range applies only to samples taken after fasting for at least 8 hours.   BUN 70 (H) 8 - 23 mg/dL   Creatinine, Ser 5.66 (H) 0.44 - 1.00 mg/dL   Calcium 7.9 (L) 8.9 - 10.3 mg/dL   GFR calc non Af Amer 6 (L) >60 mL/min   GFR calc Af Amer  7 (L) >60 mL/min   Anion gap 10 5 - 15    Comment: Performed at [facility], [address] [place], [place] [address]  Glucose, capillary     Status: Abnormal   Collection Time: 06/24/20 11:57 AM  Result Value Ref Range   Glucose-Capillary 197 (H) 70 - 99 mg/dL    Comment: Glucose reference range applies only to samples taken after fasting for at least 8 hours.   Comment 1 Notify RN   Glucose, capillary     Status: Abnormal   Collection Time: 06/24/20  4:02 PM  Result Value Ref Range   Glucose-Capillary 148 (H) 70 - 99 mg/dL    Comment: Glucose reference range applies only to samples taken after fasting for at least 8 hours.   Comment 1 Notify RN   Glucose, capillary     Status: Abnormal   Collection Time: 06/24/20  9:40 PM  Result Value Ref Range   Glucose-Capillary 140 (H) 70 - 99 mg/dL    Comment: Glucose reference range applies only to samples taken after fasting for at least 8 hours.   Comment 1 Notify RN   Renal function panel     Status: Abnormal   Collection Time: 06/25/20  4:35 AM  Result Value Ref Range   Sodium 137 135 - 145 mmol/L   Potassium 4.8 3.5 - 5.1 mmol/L   Chloride 101 98 - 111 mmol/L   CO2 24 22 - 32 mmol/L   Glucose, Bld 136 (H) 70 - 99 mg/dL    Comment: Glucose reference range applies only to samples taken after fasting for at least 8 hours.   BUN 84 (H) 8 - 23 mg/dL   Creatinine, Ser 6.09 (H) 0.44 - 1.00 mg/dL   Calcium 7.4 (L) 8.9 - 10.3 mg/dL   Phosphorus 6.1 (H) 2.5 - 4.6 mg/dL   Albumin 2.9 (L) 3.5 - 5.0 g/dL   GFR calc non Af Amer 6 (L) >60 mL/min   GFR calc Af Amer 7 (L) >60 mL/min   Anion gap 12 5 - 15    Comment: Performed at [facility], [address] [place], [place] [address]  CBC     Status: Abnormal   Collection Time: 06/25/20  4:35 AM  Result Value Ref Range   WBC 9.7 4.0 - 10.5 K/uL   RBC 2.92 (L) 3.87 - 5.11 MIL/uL   Hemoglobin 8.5 (L) 12.0 - 15.0 g/dL   HCT 27.5 (L) 36 - 46 %   MCV 94.2 80.0 - 100.0 fL    MCH 29.1 26.0 - 34.0 pg  MCHC 30.9 30.0 - 36.0 g/dL   RDW 15.8 (H) 11.5 - 15.5 %   Platelets 110 (L) 150 - 400 K/uL    Comment: Immature Platelet Fraction may be clinically indicated, consider ordering this additional test [id number]    nRBC 0.0 0.0 - 0.2 %    Comment: Performed at [facility], [address] [place], [place] [address]  Basic metabolic panel     Status: Abnormal   Collection Time: 06/25/20  4:35 AM  Result Value Ref Range   Sodium 137 135 - 145 mmol/L   Potassium 4.8 3.5 - 5.1 mmol/L   Chloride 102 98 - 111 mmol/L   CO2 23 22 - 32 mmol/L   Glucose, Bld 136 (H) 70 - 99 mg/dL    Comment: Glucose reference range applies only to samples taken after fasting for at least 8 hours.   BUN 85 (H) 8 - 23 mg/dL   Creatinine, Ser 6.15 (H) 0.44 - 1.00 mg/dL   Calcium 7.4 (L) 8.9 - 10.3 mg/dL   GFR calc non Af Amer 6 (L) >60 mL/min   GFR calc Af Amer 7 (L) >60 mL/min   Anion gap 12 5 - 15    Comment: Performed at [facility], [address] [place], [place] [address]  Glucose, capillary     Status: Abnormal   Collection Time: 06/25/20  7:41 AM  Result Value Ref Range   Glucose-Capillary 142 (H) 70 - 99 mg/dL    Comment: Glucose reference range applies only to samples taken after fasting for at least 8 hours.  Glucose, capillary     Status: Abnormal   Collection Time: 06/25/20 11:28 AM  Result Value Ref Range   Glucose-Capillary 125 (H) 70 - 99 mg/dL    Comment: Glucose reference range applies only to samples taken after fasting for at least 8 hours.  Glucose, capillary     Status: Abnormal   Collection Time: 06/25/20  1:59 PM  Result Value Ref Range   Glucose-Capillary 106 (H) 70 - 99 mg/dL    Comment: Glucose reference range applies only to samples taken after fasting for at least 8 hours.  Parathyroid hormone, intact (no Ca)     Status: Abnormal   Collection Time: 06/25/20  2:30 PM  Result Value Ref Range   PTH 163 (H) 15 - 65 pg/mL     Comment: (NOTE) Performed At: [facility] [place], [place] [id number] Rush Farmer MD [id number]   Iron and TIBC     Status: None   Collection Time: 06/25/20  2:30 PM  Result Value Ref Range   Iron 36 28 - 170 ug/dL   TIBC 283 250 - 450 ug/dL   Saturation Ratios 13 10.4 - 31.8 %   UIBC 247 ug/dL    Comment: Performed at [facility], [address] [place], [place] [address]  Ferritin     Status: None   Collection Time: 06/25/20  2:30 PM  Result Value Ref Range   Ferritin 67 11 - 307 ng/mL    Comment: Performed at [facility], [address] [place], [place] [address]  Glucose, capillary     Status: Abnormal   Collection Time: 06/25/20  3:37 PM  Result Value Ref Range   Glucose-Capillary 139 (H) 70 - 99 mg/dL    Comment: Glucose reference range applies only to samples taken after fasting for at least 8 hours.  Glucose, capillary     Status:  Abnormal   Collection Time: 06/25/20  9:33 PM  Result Value Ref Range   Glucose-Capillary 125 (H) 70 - 99 mg/dL    Comment: Glucose reference range applies only to samples taken after fasting for at least 8 hours.   Comment 1 Notify RN   CBC     Status: Abnormal   Collection Time: 06/26/20  7:19 AM  Result Value Ref Range   WBC 10.8 (H) 4.0 - 10.5 K/uL   RBC 2.92 (L) 3.87 - 5.11 MIL/uL   Hemoglobin 8.5 (L) 12.0 - 15.0 g/dL   HCT 25.8 (L) 36 - 46 %   MCV 88.4 80.0 - 100.0 fL   MCH 29.1 26.0 - 34.0 pg   MCHC 32.9 30.0 - 36.0 g/dL   RDW 15.7 (H) 11.5 - 15.5 %   Platelets 203 150 - 400 K/uL   nRBC 0.0 0.0 - 0.2 %    Comment: Performed at [facility], [address], [place], [place] [address]  Basic metabolic panel     Status: Abnormal   Collection Time: 06/26/20  7:19 AM  Result Value Ref Range   Sodium 138 135 - 145 mmol/L   Potassium 4.7 3.5 - 5.1 mmol/L   Chloride 103 98 - 111 mmol/L   CO2 26 22 - 32 mmol/L   Glucose, Bld 135 (H) 70 - 99 mg/dL    Comment: Glucose  reference range applies only to samples taken after fasting for at least 8 hours.   BUN 69 (H) 8 - 23 mg/dL   Creatinine, Ser 5.63 (H) 0.44 - 1.00 mg/dL   Calcium 7.2 (L) 8.9 - 10.3 mg/dL   GFR calc non Af Amer 6 (L) >60 mL/min   GFR calc Af Amer 7 (L) >60 mL/min   Anion gap 9 5 - 15    Comment: Performed at [facility], [address] [place], [place] [address]  Glucose, capillary     Status: Abnormal   Collection Time: 06/26/20  7:37 AM  Result Value Ref Range   Glucose-Capillary 126 (H) 70 - 99 mg/dL    Comment: Glucose reference range applies only to samples taken after fasting for at least 8 hours.   Comment 1 Notify RN    Comment 2 Document in Chart   Glucose, capillary     Status: Abnormal   Collection Time: 06/26/20  4:59 PM  Result Value Ref Range   Glucose-Capillary 102 (H) 70 - 99 mg/dL    Comment: Glucose reference range applies only to samples taken after fasting for at least 8 hours.   Comment 1 Notify RN    Comment 2 Document in Chart   CBC     Status: Abnormal   Collection Time: 06/27/20  3:16 AM  Result Value Ref Range   WBC 14.2 (H) 4.0 - 10.5 K/uL   RBC 3.05 (L) 3.87 - 5.11 MIL/uL   Hemoglobin 8.8 (L) 12.0 - 15.0 g/dL   HCT 27.5 (L) 36 - 46 %   MCV 90.2 80.0 - 100.0 fL   MCH 28.9 26.0 - 34.0 pg   MCHC 32.0 30.0 - 36.0 g/dL   RDW 15.4 11.5 - 15.5 %   Platelets 180 150 - 400 K/uL   nRBC 0.0 0.0 - 0.2 %    Comment: Performed at [facility], [address], [place], [place] [address]  Basic metabolic panel     Status: Abnormal   Collection Time: 06/27/20  3:16 AM  Result  Value Ref Range   Sodium 139 135 - 145 mmol/L   Potassium 4.6 3.5 - 5.1 mmol/L   Chloride 103 98 - 111 mmol/L   CO2 27 22 - 32 mmol/L   Glucose, Bld 137 (H) 70 - 99 mg/dL    Comment: Glucose reference range applies only to samples taken after fasting for at least 8 hours.   BUN 48 (H) 8 - 23 mg/dL   Creatinine, Ser 5.27 (H) 0.44 - 1.00 mg/dL   Calcium 7.4 (L) 8.9 -  10.3 mg/dL   GFR calc non Af Amer 7 (L) >60 mL/min   GFR calc Af Amer 8 (L) >60 mL/min   Anion gap 9 5 - 15    Comment: Performed at [facility], [address] [place], [place] [address]  Glucose, capillary     Status: Abnormal   Collection Time: 06/27/20  8:53 AM  Result Value Ref Range   Glucose-Capillary 123 (H) 70 - 99 mg/dL    Comment: Glucose reference range applies only to samples taken after fasting for at least 8 hours.   Comment 1 Notify RN    Comment 2 Document in Chart   Protime-INR     Status: None   Collection Time: 06/27/20 11:28 AM  Result Value Ref Range   Prothrombin Time 12.8 11.4 - 15.2 seconds   INR 1.0 0.8 - 1.2    Comment: (NOTE) INR goal varies based on device and disease states. Performed at [facility], [address] [place], [place] [address]   Glucose, capillary     Status: Abnormal   Collection Time: 06/27/20 11:58 AM  Result Value Ref Range   Glucose-Capillary 119 (H) 70 - 99 mg/dL    Comment: Glucose reference range applies only to samples taken after fasting for at least 8 hours.   Comment 1 Notify RN   Glucose, capillary     Status: Abnormal   Collection Time: 06/27/20  4:25 PM  Result Value Ref Range   Glucose-Capillary 116 (H) 70 - 99 mg/dL    Comment: Glucose reference range applies only to samples taken after fasting for at least 8 hours.   Comment 1 Notify RN    Comment 2 Document in Chart   Glucose, capillary     Status: Abnormal   Collection Time: 06/27/20  9:43 PM  Result Value Ref Range   Glucose-Capillary 110 (H) 70 - 99 mg/dL    Comment: Glucose reference range applies only to samples taken after fasting for at least 8 hours.  CBC     Status: Abnormal   Collection Time: 06/28/20  5:04 AM  Result Value Ref Range   WBC 9.5 4.0 - 10.5 K/uL   RBC 2.83 (L) 3.87 - 5.11 MIL/uL   Hemoglobin 8.3 (L) 12.0 - 15.0 g/dL   HCT 24.9 (L) 36 - 46 %   MCV 88.0 80.0 - 100.0 fL   MCH 29.3 26.0 - 34.0 pg   MCHC 33.3  30.0 - 36.0 g/dL   RDW 15.5 11.5 - 15.5 %   Platelets 166 150 - 400 K/uL   nRBC 0.0 0.0 - 0.2 %    Comment: Performed at [facility], [address], [place],  [address]  Basic metabolic panel     Status: Abnormal   Collection Time: 06/28/20  5:04 AM  Result Value Ref Range   Sodium 137 135 - 145 mmol/L   Potassium 4.4 3.5 - 5.1 mmol/L   Chloride  100 98 - 111 mmol/L   CO2 26 22 - 32 mmol/L   Glucose, Bld 117 (H) 70 - 99 mg/dL    Comment: Glucose reference range applies only to samples taken after fasting for at least 8 hours.   BUN 67 (H) 8 - 23 mg/dL   Creatinine, Ser 7.43 (H) 0.44 - 1.00 mg/dL   Calcium 7.4 (L) 8.9 - 10.3 mg/dL   GFR calc non Af Amer 5 (L) >60 mL/min   GFR calc Af Amer 5 (L) >60 mL/min   Anion gap 11 5 - 15    Comment: Performed at [facility], [address] [place], [place] [address]  Phosphorus     Status: Abnormal   Collection Time: 06/28/20  5:04 AM  Result Value Ref Range   Phosphorus 6.8 (H) 2.5 - 4.6 mg/dL    Comment: Performed at [facility], [address] [place], [place] [address]  APTT     Status: Abnormal   Collection Time: 06/28/20  5:04 AM  Result Value Ref Range   aPTT 42 (H) 24 - 36 seconds    Comment:        IF BASELINE aPTT IS ELEVATED, SUGGEST PATIENT RISK ASSESSMENT BE USED TO DETERMINE APPROPRIATE ANTICOAGULANT THERAPY. Performed at [facility], [address] [place], [place] [address]   Glucose, capillary     Status: Abnormal   Collection Time: 06/28/20  8:02 AM  Result Value Ref Range   Glucose-Capillary 117 (H) 70 - 99 mg/dL    Comment: Glucose reference range applies only to samples taken after fasting for at least 8 hours.   Comment 1 Notify RN   Surgical pathology     Status: None   Collection Time: 06/28/20 10:25 AM  Result Value Ref Range   SURGICAL PATHOLOGY      SURGICAL PATHOLOGY CASE: [id number] PATIENT: Soyla Murphy Surgical Pathology  Report     Specimen Submitted: A. Right kidney; bx  Clinical history: CKD, AKI, hematuria.  Glomerular kidney disease.      DIAGNOSIS: SEE REPORT UNDER RESULTS "[facility] BIOPSY SEND OUT" IN [facility].    Final Diagnosis performed by Quay Burow, MD.   Electronically signed 07/09/2020 5:02:25PM The electronic signature indicates that the named Attending Pathologist has evaluated the specimen Technical component performed at [facility], [address] [place], [place] [address] Lab: [phone] Dir: Rush Farmer, MD, MMM  Professional component performed at [facility], [facility], [place], [place], [place] [address] Lab: [phone] Dir: Dellia Nims. Rubinas, MD   Glucose, capillary     Status: Abnormal   Collection Time: 06/28/20 12:27 PM  Result Value Ref Range   Glucose-Capillary 101 (H) 70 - 99 mg/dL    Comment: Glucose reference range applies only to samples taken after fasting for at least 8 hours.   Comment 1 Notify RN   Glucose, capillary     Status: Abnormal   Collection Time: 06/28/20  4:24 PM  Result Value Ref Range   Glucose-Capillary 103 (H) 70 - 99 mg/dL    Comment: Glucose reference range applies only to samples taken after fasting for at least 8 hours.   Comment 1 Notify RN   Glucose, capillary     Status: Abnormal   Collection Time: 06/28/20  9:32 PM  Result Value Ref Range   Glucose-Capillary 152 (H) 70 - 99 mg/dL    Comment: Glucose reference range applies only to samples taken after fasting for at least 8  hours.   Comment 1 Notify RN   CBC     Status: Abnormal   Collection Time: 06/29/20  4:16 AM  Result Value Ref Range   WBC 8.3 4.0 - 10.5 K/uL   RBC 2.58 (L) 3.87 - 5.11 MIL/uL   Hemoglobin 7.6 (L) 12.0 - 15.0 g/dL   HCT 22.7 (L) 36 - 46 %   MCV 88.0 80.0 - 100.0 fL   MCH 29.5 26.0 - 34.0 pg   MCHC 33.5 30.0 - 36.0 g/dL   RDW 15.3 11.5 - 15.5 %   Platelets 190 150 - 400 K/uL   nRBC 0.0 0.0 - 0.2 %    Comment: Performed at  [facility], [address] [place], [place] [address]  Basic metabolic panel     Status: Abnormal   Collection Time: 06/29/20  4:16 AM  Result Value Ref Range   Sodium 133 (L) 135 - 145 mmol/L   Potassium 4.9 3.5 - 5.1 mmol/L   Chloride 98 98 - 111 mmol/L   CO2 23 22 - 32 mmol/L   Glucose, Bld 136 (H) 70 - 99 mg/dL    Comment: Glucose reference range applies only to samples taken after fasting for at least 8 hours.   BUN 88 (H) 8 - 23 mg/dL   Creatinine, Ser 8.94 (H) 0.44 - 1.00 mg/dL   Calcium 7.2 (L) 8.9 - 10.3 mg/dL   GFR calc non Af Amer 4 (L) >60 mL/min   GFR calc Af Amer 4 (L) >60 mL/min   Anion gap 12 5 - 15    Comment: Performed at [facility], [address] [place], [place] [address]  Glucose, capillary     Status: Abnormal   Collection Time: 06/29/20  7:56 AM  Result Value Ref Range   Glucose-Capillary 111 (H) 70 - 99 mg/dL    Comment: Glucose reference range applies only to samples taken after fasting for at least 8 hours.  Glucose, capillary     Status: None   Collection Time: 06/29/20  1:55 PM  Result Value Ref Range   Glucose-Capillary 91 70 - 99 mg/dL    Comment: Glucose reference range applies only to samples taken after fasting for at least 8 hours.   Comment 1 Notify RN    Comment 2 Document in Chart   Glucose, capillary     Status: Abnormal   Collection Time: 06/29/20  4:39 PM  Result Value Ref Range   Glucose-Capillary 105 (H) 70 - 99 mg/dL    Comment: Glucose reference range applies only to samples taken after fasting for at least 8 hours.   Comment 1 Notify RN   Glucose, capillary     Status: Abnormal   Collection Time: 06/29/20  9:50 PM  Result Value Ref Range   Glucose-Capillary 100 (H) 70 - 99 mg/dL    Comment: Glucose reference range applies only to samples taken after fasting for at least 8 hours.   Comment 1 Notify RN   CBC     Status: Abnormal   Collection Time: 06/30/20  6:11 AM  Result Value Ref Range   WBC 6.5 4.0 -  10.5 K/uL   RBC 2.17 (L) 3.87 - 5.11 MIL/uL   Hemoglobin 6.3 (L) 12.0 - 15.0 g/dL   HCT 19.0 (L) 36 - 46 %   MCV 87.6 80.0 - 100.0 fL   MCH 29.0 26.0 - 34.0 pg   MCHC 33.2 30.0 - 36.0 g/dL   RDW 15.8 (  H) 11.5 - 15.5 %   Platelets 180 150 - 400 K/uL   nRBC 0.0 0.0 - 0.2 %    Comment: Performed at [facility], [address] [place], [place] [address]  Basic metabolic panel     Status: Abnormal   Collection Time: 06/30/20  6:11 AM  Result Value Ref Range   Sodium 134 (L) 135 - 145 mmol/L   Potassium 4.6 3.5 - 5.1 mmol/L   Chloride 98 98 - 111 mmol/L   CO2 25 22 - 32 mmol/L   Glucose, Bld 104 (H) 70 - 99 mg/dL    Comment: Glucose reference range applies only to samples taken after fasting for at least 8 hours.   BUN 52 (H) 8 - 23 mg/dL   Creatinine, Ser 7.11 (H) 0.44 - 1.00 mg/dL   Calcium 7.4 (L) 8.9 - 10.3 mg/dL   GFR calc non Af Amer 5 (L) >60 mL/min   GFR calc Af Amer 6 (L) >60 mL/min   Anion gap 11 5 - 15    Comment: Performed at [facility], [address] [place], [place] [address]  Glucose, capillary     Status: Abnormal   Collection Time: 06/30/20  8:18 AM  Result Value Ref Range   Glucose-Capillary 101 (H) 70 - 99 mg/dL    Comment: Glucose reference range applies only to samples taken after fasting for at least 8 hours.  Prepare RBC (crossmatch)     Status: None   Collection Time: 06/30/20 12:09 PM  Result Value Ref Range   Order Confirmation      ORDER PROCESSED BY BLOOD BANK Performed at [facility], [address] [place], [place] [address]   Glucose, capillary     Status: None   Collection Time: 06/30/20 12:17 PM  Result Value Ref Range   Glucose-Capillary 94 70 - 99 mg/dL    Comment: Glucose reference range applies only to samples taken after fasting for at least 8 hours.  Type and screen [place]     Status: None   Collection Time: 06/30/20  1:19 PM  Result Value Ref Range   ABO/RH(D) O POS     Antibody Screen NEG    Sample Expiration 07/03/2020,2359    Unit Number [id number]    Blood Component Type RED CELLS,LR    Unit division 00    Status of Unit REL FROM [facility]    Transfusion Status OK TO TRANSFUSE    Crossmatch Result      Compatible Performed at [facility], [address] [place], [place] [address]    Unit Number [id number]    Blood Component Type RED CELLS,LR    Unit division 00    Status of Unit ISSUED,FINAL    Transfusion Status OK TO TRANSFUSE    Crossmatch Result Compatible   Hemoglobin     Status: Abnormal   Collection Time: 06/30/20  1:19 PM  Result Value Ref Range   Hemoglobin 7.0 (L) 12.0 - 15.0 g/dL    Comment: Performed at [facility], [address], [place], [place] [address]  BPAM RBC     Status: None   Collection Time: 06/30/20  1:19 PM  Result Value Ref Range   Blood Product Unit Number [id number]    PRODUCT CODE [id number]    Unit Type and Rh 5100    Blood Product Expiration Date 338250539767    ISSUE DATE / TIME [id number]    Blood Product Unit  Number [id number]    PRODUCT CODE [id number]    Unit Type and Rh 5100    Blood Product Expiration Date 782956213086   Glucose, capillary     Status: None   Collection Time: 06/30/20  4:24 PM  Result Value Ref Range   Glucose-Capillary 94 70 - 99 mg/dL    Comment: Glucose reference range applies only to samples taken after fasting for at least 8 hours.  Glucose, capillary     Status: Abnormal   Collection Time: 06/30/20 10:42 PM  Result Value Ref Range   Glucose-Capillary 109 (H) 70 - 99 mg/dL    Comment: Glucose reference range applies only to samples taken after fasting for at least 8 hours.  Glucose, capillary     Status: Abnormal   Collection Time: 07/01/20  8:38 AM  Result Value Ref Range   Glucose-Capillary 166 (H) 70 - 99 mg/dL    Comment: Glucose reference range applies only to samples taken after fasting for at least 8 hours.  CBC     Status: Abnormal    Collection Time: 07/01/20  9:12 AM  Result Value Ref Range   WBC 5.5 4.0 - 10.5 K/uL   RBC 2.18 (L) 3.87 - 5.11 MIL/uL   Hemoglobin 6.4 (L) 12.0 - 15.0 g/dL   HCT 19.4 (L) 36 - 46 %   MCV 89.0 80.0 - 100.0 fL   MCH 29.4 26.0 - 34.0 pg   MCHC 33.0 30.0 - 36.0 g/dL   RDW 15.4 11.5 - 15.5 %   Platelets 199 150 - 400 K/uL   nRBC 0.0 0.0 - 0.2 %    Comment: Performed at [facility], [address], [place], [place] [address]  Prepare RBC (crossmatch)     Status: None   Collection Time: 07/01/20 12:36 PM  Result Value Ref Range   Order Confirmation      ORDER PROCESSED BY BLOOD BANK Performed at [facility], [address] [place], [place] [address]   Glucose, capillary     Status: Abnormal   Collection Time: 07/01/20  5:11 PM  Result Value Ref Range   Glucose-Capillary 142 (H) 70 - 99 mg/dL    Comment: Glucose reference range applies only to samples taken after fasting for at least 8 hours.  Glucose, capillary     Status: Abnormal   Collection Time: 07/01/20  8:07 PM  Result Value Ref Range   Glucose-Capillary 108 (H) 70 - 99 mg/dL    Comment: Glucose reference range applies only to samples taken after fasting for at least 8 hours.  Lactate dehydrogenase     Status: None   Collection Time: 07/02/20  6:11 AM  Result Value Ref Range   LDH 116 98 - 192 U/L    Comment: Performed at [facility], [address] [place], [place] [address]  Haptoglobin     Status: None   Collection Time: 07/02/20  6:11 AM  Result Value Ref Range   Haptoglobin 262 41 - 333 mg/dL    Comment: (NOTE) Performed At: [facility], [place] [id number] Rush Farmer MD [id number]   Vitamin B12     Status: Abnormal   Collection Time: 07/02/20  6:11 AM  Result Value Ref Range   Vitamin B-12 2,356 (H) 180 - 914 pg/mL    Comment: (NOTE) This assay is not validated for testing neonatal or myeloproliferative syndrome specimens for Vitamin B12  levels. Performed at [facility], [address] [place], [place] [address]   Folate     Status: None   Collection Time: 07/02/20  6:11 AM  Result Value Ref Range   Folate 13.6 >5.9 ng/mL    Comment: Performed at [facility], [address] [place], [place] [address]  Technologist smear review     Status: None   Collection Time: 07/02/20  6:11 AM  Result Value Ref Range   WBC Morphology MORPHOLOGY UNREMARKABLE    RBC Morphology MORPHOLOGY UNREMARKABLE    Tech Review Normal platelet morphology     Comment: Performed at [facility], [address] [place], [place] [address]  CBC with Differential/Platelet     Status: Abnormal   Collection Time: 07/02/20  6:11 AM  Result Value Ref Range   WBC 5.3 4.0 - 10.5 K/uL   RBC 2.52 (L) 3.87 - 5.11 MIL/uL   Hemoglobin 7.4 (L) 12.0 - 15.0 g/dL   HCT 21.8 (L) 36 - 46 %   MCV 86.5 80.0 - 100.0 fL   MCH 29.4 26.0 - 34.0 pg   MCHC 33.9 30.0 - 36.0 g/dL   RDW 15.5 11.5 - 15.5 %   Platelets 198 150 - 400 K/uL   nRBC 0.0 0.0 - 0.2 %   Neutrophils Relative % 79 %   Neutro Abs 4.2 1.7 - 7.7 K/uL   Lymphocytes Relative 9 %   Lymphs Abs 0.5 (L) 0.7 - 4.0 K/uL   Monocytes Relative 9 %   Monocytes Absolute 0.5 0.1 - 1.0 K/uL   Eosinophils Relative 0 %   Eosinophils Absolute 0.0 0.0 - 0.5 K/uL   Basophils Relative 1 %   Basophils Absolute 0.0 0.0 - 0.1 K/uL   Immature Granulocytes 2 %   Abs Immature Granulocytes 0.11 (H) 0.00 - 0.07 K/uL    Comment: Performed at [facility], [address] [place], [place] [address]  Glucose, capillary     Status: Abnormal   Collection Time: 07/02/20  8:21 AM  Result Value Ref Range   Glucose-Capillary 114 (H) 70 - 99 mg/dL    Comment: Glucose reference range applies only to samples taken after fasting for at least 8 hours.  Glucose, capillary     Status: Abnormal   Collection Time: 07/02/20 11:33 AM  Result Value Ref Range   Glucose-Capillary 174 (H) 70 - 99 mg/dL     Comment: Glucose reference range applies only to samples taken after fasting for at least 8 hours.  Basic metabolic panel     Status: Abnormal   Collection Time: 07/06/20  5:02 PM  Result Value Ref Range   Sodium 135 135 - 145 mmol/L   Potassium 3.3 (L) 3.5 - 5.1 mmol/L   Chloride 98 98 - 111 mmol/L   CO2 26 22 - 32 mmol/L   Glucose, Bld 145 (H) 70 - 99 mg/dL    Comment: Glucose reference range applies only to samples taken after fasting for at least 8 hours.   BUN 20 8 - 23 mg/dL   Creatinine, Ser 2.81 (H) 0.44 - 1.00 mg/dL   Calcium 7.3 (L) 8.9 - 10.3 mg/dL   GFR calc non Af Amer 15 (L) >60 mL/min   Anion gap 11 5 - 15    Comment: Performed at [facility], [place] [place], [place] [address]  CBC     Status: Abnormal   Collection Time: 07/06/20  5:02 PM  Result Value Ref Range   WBC 13.0 (H)  4.0 - 10.5 K/uL   RBC 2.88 (L) 3.87 - 5.11 MIL/uL   Hemoglobin 8.4 (L) 12.0 - 15.0 g/dL   HCT 25.3 (L) 36 - 46 %   MCV 87.8 80.0 - 100.0 fL   MCH 29.2 26.0 - 34.0 pg   MCHC 33.2 30.0 - 36.0 g/dL   RDW 15.9 (H) 11.5 - 15.5 %   Platelets 230 150 - 400 K/uL   nRBC 0.0 0.0 - 0.2 %    Comment: Performed at [facility], [address], [place], [place] [address]    Radiology DG Chest 2 View  Result Date: 06/23/2020 CLINICAL DATA:  CHF, acute kidney failure EXAM: CHEST - 2 VIEW COMPARISON:  May 04, 2016 FINDINGS: Post median sternotomy for CABG.  Stable cardiac enlargement. Mild interstitial prominence bilaterally. No lobar consolidation. Patchy opacity with linear characteristics in the LEFT lung base. No acute skeletal process on limited assessment. IMPRESSION: 1. Cardiomegaly with mild interstitial prominence which may reflect mild edema. No lobar consolidation. 2. Areas of platelike in linear opacity at the LEFT lung base likely atelectasis. Electronically Signed   By: Zetta Bills M.D.   On: 06/23/2020 11:42   PERIPHERAL VASCULAR CATHETERIZATION  Result  Date: 06/25/2020 See op note  US Venous Img Lower Unilateral Right  Result Date: 07/06/2020 CLINICAL DATA:  Right lower extremity swelling. EXAM: Right LOWER EXTREMITY VENOUS DOPPLER ULTRASOUND TECHNIQUE: Gray-scale sonography with compression, as well as color and duplex ultrasound, were performed to evaluate the deep venous system(s) from the level of the common femoral vein through the popliteal and proximal calf veins. COMPARISON:  Right lower extremity Doppler ultrasound 06/12/2016 FINDINGS: VENOUS Normal compressibility of the common femoral, superficial femoral, and popliteal veins, as well as the visualized calf veins. Visualized portions of profunda femoral vein and great saphenous vein unremarkable. No filling defects to suggest DVT on grayscale or color Doppler imaging. Doppler waveforms show normal direction of venous flow, normal respiratory plasticity and response to augmentation. Limited views of the contralateral common femoral vein are unremarkable. OTHER None. Limitations: none IMPRESSION: Negative. Electronically Signed   By: San Morelle M.D.   On: 07/06/2020 20:10   ECHOCARDIOGRAM COMPLETE  Result Date: 06/24/2020    ECHOCARDIOGRAM REPORT   Patient Name:   MOZELLE REMLINGER Date of Exam: 06/23/2020 Medical Rec #:  [id number]         Height:       64.0 in Accession #:    [id number]        Weight:       207.0 lb Date of Birth:  1934/09/30         BSA:          1.985 m Patient Age:    65 years          BP:           178/94 mmHg Patient Gender: F                 HR:           87 bpm. Exam Location:  [facility] Procedure: 2D Echo, Cardiac Doppler and Color Doppler Indications:     O37.85 Acute Diastolic CHF  History:         Patient has prior history of Echocardiogram examinations, most                  recent 07/17/2015. Prior CABG; Risk Factors:Hypertension,  Dyslipidemia and Diabetes. Thyroid disease. Pneumonia. History                  of breast cancer. Dyspnea. Coronary  artery disease. Chronic                  kidney disease.  Sonographer:     Wilford Sports Rodgers-Jones Referring Phys:  [id number] [place] Diagnosing Phys: Kate Sable MD IMPRESSIONS  1. Left ventricular ejection fraction, by estimation, is 60 to 65%. The left ventricle has normal function. The left ventricle has no regional wall motion abnormalities. Left ventricular diastolic parameters were normal.  2. Right ventricular systolic function is normal. The right ventricular size is normal. There is normal pulmonary artery systolic pressure.  3. The mitral valve is normal in structure. No evidence of mitral valve regurgitation. No evidence of mitral stenosis.  4. The aortic valve was not well visualized. Aortic valve regurgitation is not visualized.  5. The inferior vena cava is normal in size with greater than 50% respiratory variability, suggesting right atrial pressure of 3 mmHg. FINDINGS  Left Ventricle: Left ventricular ejection fraction, by estimation, is 60 to 65%. The left ventricle has normal function. The left ventricle has no regional wall motion abnormalities. The left ventricular internal cavity size was normal in size. There is  no left ventricular hypertrophy. Left ventricular diastolic parameters were normal. Right Ventricle: The right ventricular size is normal. No increase in right ventricular wall thickness. Right ventricular systolic function is normal. There is normal pulmonary artery systolic pressure. The tricuspid regurgitant velocity is 2.74 m/s, and  with an assumed right atrial pressure of 3 mmHg, the estimated right ventricular systolic pressure is 38.1 mmHg. Left Atrium: Left atrial size was normal in size. Right Atrium: Right atrial size was normal in size. Pericardium: There is no evidence of pericardial effusion. Mitral Valve: The mitral valve is normal in structure. No evidence of mitral valve regurgitation. No evidence of mitral valve stenosis. Tricuspid Valve: The tricuspid valve  is normal in structure. Tricuspid valve regurgitation is not demonstrated. No evidence of tricuspid stenosis. Aortic Valve: The aortic valve was not well visualized. Aortic valve regurgitation is not visualized. Pulmonic Valve: The pulmonic valve was normal in structure. Pulmonic valve regurgitation is not visualized. No evidence of pulmonic stenosis. Aorta: The aortic root is normal in size and structure. Venous: The inferior vena cava is normal in size with greater than 50% respiratory variability, suggesting right atrial pressure of 3 mmHg. IAS/Shunts: No atrial level shunt detected by color flow Doppler.  LEFT VENTRICLE PLAX 2D LVIDd:         5.03 cm  Diastology LVIDs:         2.94 cm  LV e' medial:    8.38 cm/s LV PW:         0.99 cm  LV E/e' medial:  11.6 LV IVS:        0.91 cm  LV e' lateral:   9.36 cm/s LVOT diam:     2.00 cm  LV E/e' lateral: 10.4 LV SV:         61 LV SV Index:   31 LVOT Area:     3.14 cm  RIGHT VENTRICLE             IVC RV Basal diam:  3.51 cm     IVC diam: 1.51 cm RV S prime:     15.00 cm/s TAPSE (M-mode): 2.1 cm LEFT ATRIUM  Index       RIGHT ATRIUM           Index LA diam:        4.30 cm 2.17 cm/m  RA Area:     15.90 cm LA Vol (A2C):   57.4 ml 28.91 ml/m RA Volume:   48.10 ml  24.23 ml/m LA Vol (A4C):   58.5 ml 29.47 ml/m LA Biplane Vol: 59.5 ml 29.97 ml/m  AORTIC VALVE LVOT Vmax:   79.30 cm/s LVOT Vmean:  56.600 cm/s LVOT VTI:    0.195 m  AORTA Ao Root diam: 2.60 cm Ao Asc diam:  3.20 cm MITRAL VALVE                TRICUSPID VALVE MV Area (PHT): 3.42 cm     TR Peak grad:   30.0 mmHg MV Decel Time: 222 msec     TR Vmax:        274.00 cm/s MV E velocity: 97.30 cm/s MV A velocity: 117.00 cm/s  SHUNTS MV E/A ratio:  0.83         Systemic VTI:  0.20 m                             Systemic Diam: 2.00 cm Kate Sable MD Electronically signed by Kate Sable MD Signature Date/Time: 06/24/2020/12:36:36 PM    Final    US BIOPSY (KIDNEY)  Result Date:  06/28/2020 INDICATION: Chronic kidney disease, acute kidney injury, hematuria and need for renal biopsy for further workup. EXAM: ULTRASOUND GUIDED CORE BIOPSY OF RIGHT KIDNEY MEDICATIONS: None. ANESTHESIA/SEDATION: Fentanyl 50 mcg IV; Versed 1.0 mg IV Moderate Sedation Time:  21 minutes. The patient was continuously monitored during the procedure by the interventional radiology nurse under my direct supervision. PROCEDURE: The procedure, risks, benefits, and alternatives were explained to the patient. Questions regarding the procedure were encouraged and answered. The patient understands and consents to the procedure. A time-out was performed prior to initiating the procedure. Both kidneys were examined in a prone position. The right was chosen for biopsy. The right flank region was prepped with chlorhexidine in a sterile fashion, and a sterile drape was applied covering the operative field. A sterile gown and sterile gloves were used for the procedure. Local anesthesia was provided with 1% Lidocaine. A 15 gauge trocar needle was advanced to the level of lower pole cortex of the right kidney. Under ultrasound guidance, 2 separate 16 gauge core biopsy samples were obtained. Core biopsy samples were submitted on saline soaked Telfa gauze. A slurry of Gel-Foam pledgets mixed in normal saline was then injected through the 15 gauge needle as the needle was retracted and removed. Additional ultrasound was performed. COMPLICATIONS: None immediate. FINDINGS: Both kidneys are visible by ultrasound. The right kidney was less deep in location and better visualized with slightly thicker renal cortex compared to the left. Solid core biopsy samples were obtained. IMPRESSION: Ultrasound-guided core biopsy performed at the level of right lower pole renal cortex. Electronically Signed   By: Aletta Edouard M.D.   On: 06/28/2020 10:42   VAS US CAROTID  Result Date: 07/16/2020 Carotid Arterial Duplex Study Indications: 06/25/2020  Rt IJV catheter. Performing Technologist: Charlane Ferretti RT (R)(VS)  Examination Guidelines: A complete evaluation includes B-mode imaging, spectral Doppler, color Doppler, and power Doppler as needed of all accessible portions of each vessel. Bilateral testing is considered an integral part of a complete examination. Limited examinations for  reoccurring indications may be performed as noted.  Right Carotid Findings: +----------+--------+--------+--------+------------------+-------------------+           PSV cm/sEDV cm/sStenosisPlaque DescriptionComments            +----------+--------+--------+--------+------------------+-------------------+ CCA Prox  78      11              heterogenous      intimal thickening  +----------+--------+--------+--------+------------------+-------------------+ CCA Mid   75      14                                intimal thickening  +----------+--------+--------+--------+------------------+-------------------+ CCA Distal72      12                                                    +----------+--------+--------+--------+------------------+-------------------+ ICA Prox  84      19              calcific          ICA/CCA ratio = 1.7 +----------+--------+--------+--------+------------------+-------------------+ ICA Mid   122     32                                                    +----------+--------+--------+--------+------------------+-------------------+ ICA Distal125     35                                                    +----------+--------+--------+--------+------------------+-------------------+ ECA       92      1                                                     +----------+--------+--------+--------+------------------+-------------------+ +----------+--------+-------+-----------+-------------------+           PSV cm/sEDV cmsDescribe   Arm Pressure (mmHG)  +----------+--------+-------+-----------+-------------------+ [id number]            Multiphasic                    +----------+--------+-------+-----------+-------------------+ +---------+--------+--+--------+-+---------+ VertebralPSV cm/s60EDV cm/s7Antegrade +---------+--------+--+--------+-+---------+ Left Carotid Findings: +----------+--------+--------+--------+------------------+-------------------+           PSV cm/sEDV cm/sStenosisPlaque DescriptionComments            +----------+--------+--------+--------+------------------+-------------------+ CCA Prox  77      13                                intimal thickening  +----------+--------+--------+--------+------------------+-------------------+ CCA Mid   76      14              heterogenous                          +----------+--------+--------+--------+------------------+-------------------+ CCA Distal79      14  calcific          intimal thickening  +----------+--------+--------+--------+------------------+-------------------+ ICA Prox  93      19              calcific          ICA/CCA ratio = 1.7 +----------+--------+--------+--------+------------------+-------------------+ ICA Mid   130     30                                                    +----------+--------+--------+--------+------------------+-------------------+ ICA Distal61      20                                                    +----------+--------+--------+--------+------------------+-------------------+ ECA       158     1               calcific                              +----------+--------+--------+--------+------------------+-------------------+ +----------+--------+--------+-----------+-------------------+           PSV cm/sEDV cm/sDescribe   Arm Pressure (mmHG) +----------+--------+--------+-----------+-------------------+ Subclavian209             Multiphasic                     +----------+--------+--------+-----------+-------------------+ +---------+--------+--+--------+--+---------+ VertebralPSV cm/s84EDV cm/s22Antegrade +---------+--------+--+--------+--+---------+  Summary: Right Carotid: Velocities in the right ICA are consistent with a 40-59%                stenosis. Left Carotid: Velocities in the left ICA are consistent with a 40-59% stenosis.               The ECA appears >50% stenosed. Vertebrals:  Bilateral vertebral arteries demonstrate antegrade flow. Subclavians: Normal flow hemodynamics were seen in bilateral subclavian              arteries. *See table(s) above for measurements and observations.  Electronically signed by [name] MD on 07/16/2020 at 9:57:04 AM.    Final     Assessment/Plan HTN, goal below 140/80 blood pressure control important in reducing the progression of atherosclerotic disease. On appropriate oral medications.   Stroke [facility]) The patient has 2 previous histories of stroke both affecting her right arm.  This would correlate with a left carotid stenosis but angiogram showed a lesser degree of stenosis and medical management was planned. No new symptoms.  Diabetes mellitus ([place]) blood glucose control important in reducing the progression of atherosclerotic disease. Also, involved in wound healing. On appropriate medications.   Hyperlipidemia lipid control important in reducing the progression of atherosclerotic disease. Continue statin therapy  Carotid stenosis We performed a carotid duplex today which correlates much more with her catheter-based angiogram findings and would be in the 40 to 59% range bilaterally.  Particularly given her extensive medical comorbidities, no intervention or surgery would be recommended at this time.  Continue current medical regimen.  Recheck in 6 months.  ESRD on dialysis [facility]) I have messaged her nephrologist today to get their input.  And less she is expected to have significant renal  function return, I think we should  probably go ahead and consider evaluating her for an AV fistula.  I will wait to get there response and if they feel proceeding with fistula placement is prudent, vein mapping can be done at her convenience.    [name], MD  07/16/2020 11:18 AM    This note was created with Dragon medical transcription system.  Any errors from dictation are purely unintentional

## 2020-07-16 NOTE — Progress Notes (Addendum)
[name] ([id number]) Visit Report for 07/16/2020 Allergy List Details [name] Date of Service: 07/16/2020 10:30 AM Medical Record Number: [id number] Patient Account Number: [id number] Date of Birth/Sex: 1934/07/02 (86 y.o. F) Treating RN: Cornell Barman Primary Care Donnice Nielsen: Fulton Reek Other Clinician: Referring Natividad Schlosser: Fulton Reek Treating Zimere Dunlevy/Extender: Melburn Hake, HOYT Weeks in Treatment: 0 Allergies Active Allergies clonidine Reaction: sob Darvocet-N hydralazine esomeprazole guaifenesin phenylephrine rabeprazole telithromycin fluocinonide Iodinated Contrast Media Levaquin Reaction: nausea Statins-Hmg-Coa Reductase Inhibitors Allergy Notes Electronic Signature(s) Signed: 07/16/2020 1:17:05 PM By: Gretta Cool, BSN, RN, CWS, Kim RN, BSN Entered By: Gretta Cool, BSN, RN, CWS, Kim on 07/16/2020 13:17:05 [name] ([id number]) -------------------------------------------------------------------------------- Arrival Information Details [name] Date of Service: 07/16/2020 10:30 AM Medical Record Number: [id number] Patient Account Number: [id number] Date of Birth/Sex: [birth year] (86 y.o. F) Treating RN: Cornell Barman Primary Care Ellyanna Holton: Fulton Reek Other Clinician: Referring Ranata Laughery: Fulton Reek Treating Latasha Puskas/Extender: Melburn Hake, HOYT Weeks in Treatment: 0 Visit Information Patient Arrived: Wheel Chair Arrival Time: 10:44 Accompanied By: spouse Transfer Assistance: Manual Patient Identification Verified: Yes Secondary Verification Process Completed: Yes History Since Last Visit Has Dressing in Place as Prescribed: Yes Has Compression in Place as Prescribed: Yes Electronic Signature(s) Signed: 07/16/2020 11:03:28 AM By: Gretta Cool, BSN, RN, CWS, Kim RN, BSN Entered By: Gretta Cool, BSN, RN, CWS, Kim on 07/16/2020 11:03:27 [name]  ([id number]) -------------------------------------------------------------------------------- Compression Therapy Details [name] Date of Service: 07/16/2020 10:30 AM Medical Record Number: [id number] Patient Account Number: [id number] Date of Birth/Sex: 1934/06/29 (86 y.o. F) Treating RN: Cornell Barman Primary Care Genita Nilsson: Fulton Reek Other Clinician: Referring Ayansh Feutz: Fulton Reek Treating Ree Alcalde/Extender: Melburn Hake, HOYT Weeks in Treatment: 0 Compression Therapy Performed for Wound Assessment: Wound #1 Right,Dorsal Foot Performed By: Clinician Cornell Barman, RN Compression Type: Three Layer Notes Bilateral wraps used to control swelling. Electronic Signature(s) Signed: 07/16/2020 11:09:29 AM By: Gretta Cool, BSN, RN, CWS, Kim RN, BSN Entered By: Gretta Cool, BSN, RN, CWS, Kim on 07/16/2020 11:09:29 [name] ([id number]) -------------------------------------------------------------------------------- Encounter Discharge Information Details [name] Date of Service: 07/16/2020 10:30 AM Medical Record Number: [id number] Patient Account Number: [id number] Date of Birth/Sex: 06-07-1934 (86 y.o. F) Treating RN: Cornell Barman Primary Care Roniel Halloran: Fulton Reek Other Clinician: Referring Williams Dietrick: Fulton Reek Treating Shanna Un/Extender: Melburn Hake, HOYT Weeks in Treatment: 0 Encounter Discharge Information Items Discharge Condition: Stable Ambulatory Status: Wheelchair Discharge Destination: Home Transportation: Private Auto Accompanied By: husband Schedule Follow-up Appointment: Yes Clinical Summary of Care: Electronic Signature(s) Signed: 07/16/2020 11:10:07 AM By: Gretta Cool, BSN, RN, CWS, Kim RN, BSN Entered By: Gretta Cool, BSN, RN, CWS, Kim on 07/16/2020 11:10:07 [name] ([id number]) -------------------------------------------------------------------------------- Wound Assessment Details [name] Date of Service: 07/16/2020 10:30 AM Medical Record Number: [id number] Patient Account Number: [id number] Date of Birth/Sex: 1933/12/09 (86 y.o. F) Treating RN: Cornell Barman Primary Care Marykay Mccleod: Fulton Reek Other Clinician: Referring Precilla Purnell: Fulton Reek Treating Rafiq Bucklin/Extender: Melburn Hake, HOYT Weeks in Treatment: 0 Wound Status Wound Number: 1 Primary Diabetic Wound/Ulcer of the Lower Extremity Etiology: Wound Location: Right, Dorsal Foot Wound Status: Open Wounding Event: Blister Comorbid Congestive Heart Failure, Hypertension, Type II Date Acquired: 07/02/2020 History: Diabetes, Received Radiation Weeks Of Treatment: 0 Clustered Wound: No Photos Wound Measurements Length: (cm) 5.5 Width: (cm) 4.5 Depth: (cm) 0.1 Area: (cm) 19.439 Volume: (cm) 1.944 % Reduction in Area: 25% % Reduction in Volume: 25% Epithelialization: None Tunneling: No Undermining: No Wound Description Classification: Grade 2 Wound Margin: Flat  and Intact Exudate Amount: Large Exudate Type: Serosanguineous Exudate Color: red, brown Foul Odor After Cleansing: No Slough/Fibrino Yes Wound Bed Granulation Amount: Medium (34-66%) Exposed Structure Granulation Quality: Red Fascia Exposed: No Necrotic Amount: Small (1-33%) Fat Layer (Subcutaneous Tissue) Exposed: Yes Necrotic Quality: Adherent Slough Tendon Exposed: No Muscle Exposed: No Joint Exposed: No Bone Exposed: No Treatment Notes Wound #1 (Right, Dorsal Foot) Notes SIlver Alginate, Xtrasorb, 3 layer bilateral for swelling Electronic Signature(s) Signed: 07/16/2020 11:07:27 AM By: Gretta Cool, BSN, RN, CWS, Kim RN, BSN Burt, Gretchen T. ([id number]) Entered By: Gretta Cool, BSN, RN, CWS, Kim on 07/16/2020 11:07:26

## 2020-07-16 NOTE — Patient Instructions (Signed)
AV Fistula Placement  Arteriovenous (AV) fistula placement is a surgical procedure to create a connection between a blood vessel that carries blood away from your heart (artery) and a blood vessel that returns blood to your heart (vein). This connection is called a fistula. It is often made in the forearm or upper arm. You may need this procedure if you are getting hemodialysis treatments for kidney disease. An AV fistula makes your vein larger and stronger over several months. This makes the vein a safe and easy spot to insert the needles that are used for hemodialysis. Tell a health care provider about:  Any allergies you have.  All medicines you are taking, including vitamins, herbs, eye drops, creams, and over-the-counter medicines.  Any problems you or family members have had with anesthetic medicines.  Any blood disorders you have.  Any surgeries you have had.  Any medical conditions you have or have had in the past. What are the risks? Generally, this is a safe procedure. However, problems may occur, including:  Infection.  Blood clot (thrombosis).  Reduced blood flow (stenosis).  Weakening or ballooning out of the fistula (aneurysm).  Bleeding.  Allergic reactions to medicines.  Nerve damage.  Swelling near the fistula (lymphedema).  Weakening of your heart (congestive heart failure).  Failure of the procedure. What happens before the procedure? Staying hydrated Follow instructions from your health care provider about hydration, which may include:  Up to 2 hours before the procedure - you may continue to drink clear liquids, such as water, clear fruit juice, black coffee, and plain tea.  Eating and drinking restrictions Follow instructions from your health care provider about eating and drinking, which may include:  8 hours before the procedure - stop eating heavy meals or foods, such as meat, fried foods, or fatty foods.  6 hours before the procedure - stop  eating light meals or foods, such as toast or cereal.  6 hours before the procedure - stop drinking milk or drinks that contain milk.  2 hours before the procedure - stop drinking clear liquids. Medicines Ask your health care provider about:  Changing or stopping your regular medicines. This is especially important if you are taking diabetes medicines or blood thinners.  Taking medicines such as aspirin and ibuprofen. These medicines can thin your blood. Do not take these medicines unless your health care provider tells you to take them.  Taking over-the-counter medicines, vitamins, herbs, and supplements. General instructions  Imaging tests of your arm may be done to find the best place for the fistula.  Plan to have someone take you home from the hospital or clinic.  Ask your health care provider how your surgical site will be marked or identified.  Ask your health care provider what steps will be taken to help prevent infection. These may include: ? Removing hair at the surgery site. ? Washing skin with a germ-killing soap. ? Taking antibiotic medicine.  Do not use any products that contain nicotine or tobacco for as long as possible before and after the procedure. These products include cigarettes, e-cigarettes, and chewing tobacco. If you need help quitting, ask your health care provider. What happens during the procedure?  An IV will be inserted into one of your veins.  You will be given one or more of the following: ? A medicine to help you relax (sedative). ? A medicine to numb the area (local anesthetic). ? A medicine to make you fall asleep (general anesthetic). ? A medicine that  is injected into an area of your body to numb everything below the injection site (regional anesthetic).  The fistula site will be cleaned with a germ-killing solution (antiseptic).  An incision will be made on the inner side of your arm.  A vein and an artery will be opened and connected  with stitches (sutures).  The incision will be closed with sutures or clips.  A bandage (dressing) will be placed over the area. The procedure may vary among health care providers and hospitals. What happens after the procedure?  Your blood pressure, heart rate, breathing rate, and blood oxygen level may be monitored until you leave the hospital or clinic.  Your fistula site will be checked for bleeding or swelling.  You will be given pain medicine as needed.  Do not drive for 24 hours if you were given a sedative during your procedure. Summary  Arteriovenous (AV) fistula placement is a surgical procedure to create a connection between a blood vessel that carries blood away from your heart (artery) and a blood vessel that returns blood to your heart (vein). This connection is called a fistula.  Follow instructions from your health care provider about eating and drinking before the procedure.  Ask your health care provider about changing or stopping your regular medicines before the procedure. This is especially important if you are taking diabetes medicines or blood thinners.  Do not drive for 24 hours if you were given a sedative during your procedure.  Plan to have someone take you home from the hospital or clinic. This information is not intended to replace advice given to you by your health care provider. Make sure you discuss any questions you have with your health care provider. Document Revised: 03/07/2019 Document Reviewed: 03/25/2018 Else[employer] Patient Education  [employer].

## 2020-07-19 ENCOUNTER — Other Ambulatory Visit: Payer: Self-pay

## 2020-07-19 DIAGNOSIS — E11621 Type 2 diabetes mellitus with foot ulcer: Secondary | ICD-10-CM | POA: Diagnosis not present

## 2020-07-19 NOTE — Progress Notes (Signed)
DESEREA, BORDLEY ([id number]) Visit Report for 07/14/2020 Allergy List Details [name], [name] Date of Service: 07/14/2020 2:45 PM Medical Record Number: [id number] Patient Account Number: [id number] Date of Birth/Sex: 1934-03-25 (86 y.o. F) Treating RN: Army Melia Primary Care Lasasha Brophy: Fulton Reek Other Clinician: Referring Anabia Weatherwax: Fulton Reek Treating Tais Koestner/Extender: Ricard Dillon Weeks in Treatment: 0 Allergies Active Allergies No Known Allergies Allergy Notes Electronic Signature(s) Signed: 07/14/2020 4:34:44 PM By: Army Melia Entered By: Army Melia on 07/14/2020 15:07:33 [name] ([id number]) -------------------------------------------------------------------------------- Arrival Information Details [name] Date of Service: 07/14/2020 2:45 PM Medical Record Number: [id number] Patient Account Number: [id number] Date of Birth/Sex: 03-04-1934 (86 y.o. F) Treating RN: Army Melia Primary Care Estel Scholze: Fulton Reek Other Clinician: Referring Micky Sheller: Fulton Reek Treating Rudolfo Brandow/Extender: Tito Dine in Treatment: 0 Visit Information Patient Arrived: Wheel Chair Arrival Time: 15:06 Accompanied By: husband Transfer Assistance: None Patient Identification Verified: Yes Electronic Signature(s) Signed: 07/14/2020 4:34:44 PM By: Army Melia Entered By: Army Melia on 07/14/2020 15:06:45 [name] ([id number]) -------------------------------------------------------------------------------- Clinic Level of Care Assessment Details [name] Date of Service: 07/14/2020 2:45 PM Medical Record Number: [id number] Patient Account Number: [id number] Date of Birth/Sex: 1934-06-26 (86 y.o. F) Treating RN: Cornell Barman Primary Care Kaylon Hitz: Fulton Reek Other Clinician: Referring Raevon Broom: Fulton Reek Treating Paizlie Klaus/Extender: Tito Dine in Treatment: 0 Clinic Level of Care Assessment Items TOOL 1 Quantity Score []  - Use when EandM and Procedure is performed on INITIAL visit 0 ASSESSMENTS - Nursing Assessment / Reassessment X - General Physical Exam (combine w/ comprehensive assessment (listed just below) when performed on new 1 20 pt. evals) X- 1 25 Comprehensive Assessment (HX, ROS, Risk Assessments, Wounds Hx, etc.) ASSESSMENTS - Wound and Skin Assessment / Reassessment []  - Dermatologic / Skin Assessment (not related to wound area) 0 ASSESSMENTS - Ostomy and/or Continence Assessment and Care []  - Incontinence Assessment and Management 0 []  - 0 Ostomy Care Assessment and Management (repouching, etc.) PROCESS - Coordination of Care X - Simple Patient / Family Education for ongoing care 1 15 []  - 0 Complex (extensive) Patient / Family Education for ongoing care X- 1 10 Staff obtains Programmer, systems, Records, Test Results / Process Orders []  - 0 Staff telephones HHA, Nursing Homes / Clarify orders / etc []  - 0 Routine Transfer to another Facility (non-emergent condition) []  - 0 Routine Hospital Admission (non-emergent condition) X- 1 15 New Admissions / Biomedical engineer / Ordering NPWT, Apligraf, etc. []  - 0 Emergency Hospital Admission (emergent condition) PROCESS - Special Needs []  - Pediatric / Minor Patient Management 0 []  - 0 Isolation Patient Management []  - 0 Hearing / Language / Visual special needs []  - 0 Assessment of Community assistance (transportation, D/C planning, etc.) []  - 0 Additional assistance / Altered mentation []  - 0 Support Surface(s) Assessment (bed, cushion, seat, etc.) INTERVENTIONS - Miscellaneous []  - External ear exam 0 []  - 0 Patient Transfer (multiple staff / Civil Service fast streamer / Similar devices) []  - 0 Simple Staple / Suture removal (25 or less) []  - 0 Complex Staple / Suture removal (26 or more) []  - 0 Hypo/Hyperglycemic Management (do not check if billed  separately) X- 1 15 Ankle / Brachial Index (ABI) - do not check if billed separately Has the patient been seen at the hospital within the last three years: Yes Total Score: 100 Level Of Care: New/Established - Level 3 ARTEMISIA, AUVIL T. ([id number]) Electronic Signature(s) Signed: 07/19/2020 10:00:34  AM By: Gretta Cool, BSN, RN, CWS, Kim RN, BSN Entered By: Gretta Cool, BSN, RN, CWS, Kim on 07/14/2020 15:38:36 [name] ([id number]) -------------------------------------------------------------------------------- Compression Therapy Details [name] Date of Service: 07/14/2020 2:45 PM Medical Record Number: [id number] Patient Account Number: [id number] Date of Birth/Sex: 20-Dec-1933 (86 y.o. F) Treating RN: Cornell Barman Primary Care Jazmina Muhlenkamp: Fulton Reek Other Clinician: Referring Lux Meaders: Fulton Reek Treating Demarquis Osley/Extender: Tito Dine in Treatment: 0 Compression Therapy Performed for Wound Assessment: Wound #1 Right,Dorsal Foot Performed By: Clinician Cornell Barman, RN Compression Type: Three Layer Pre Treatment ABI: 1 Post Procedure Diagnosis Same as Pre-procedure Electronic Signature(s) Signed: 07/19/2020 10:00:34 AM By: Gretta Cool, BSN, RN, CWS, Kim RN, BSN Entered By: Gretta Cool, BSN, RN, CWS, Kim on 07/14/2020 15:39:05 [name] ([id number]) -------------------------------------------------------------------------------- Encounter Discharge Information Details [name] Date of Service: 07/14/2020 2:45 PM Medical Record Number: [id number] Patient Account Number: [id number] Date of Birth/Sex: [birth year] (86 y.o. F) Treating RN: Cornell Barman Primary Care Aliyana Dlugosz: Fulton Reek Other Clinician: Referring Murray Guzzetta: Fulton Reek Treating Marjoria Mancillas/Extender: Tito Dine in Treatment: 0 Encounter Discharge Information Items Discharge Condition: Stable Ambulatory Status: Wheelchair Discharge  Destination: Home Transportation: Private Auto Accompanied By: husband Schedule Follow-up Appointment: Yes Clinical Summary of Care: Electronic Signature(s) Signed: 07/19/2020 10:00:34 AM By: Gretta Cool, BSN, RN, CWS, Kim RN, BSN Entered By: Gretta Cool, BSN, RN, CWS, Kim on 07/14/2020 15:43:05 [name] ([id number]) -------------------------------------------------------------------------------- Lower Extremity Assessment Details [name] Date of Service: 07/14/2020 2:45 PM Medical Record Number: [id number] Patient Account Number: [id number] Date of Birth/Sex: 1934-08-03 (86 y.o. F) Treating RN: Army Melia Primary Care Angelo Caroll: Fulton Reek Other Clinician: Referring Geovany Trudo: Fulton Reek Treating Erie Sica/Extender: Tito Dine in Treatment: 0 Edema Assessment Assessed: [Left: No] [Right: No] Edema: [Left: Yes] [Right: Yes] Calf Left: Right: Point of Measurement: 31 cm From Medial Instep 44 cm 46 cm Ankle Left: Right: Point of Measurement: 11 cm From Medial Instep 31 cm 28 cm Vascular Assessment Pulses: Dorsalis Pedis Palpable: [Left:No] [Right:No] Doppler Audible: [Left:Yes] [Right:Yes] Posterior Tibial Palpable: [Left:No] [Right:No] Doppler Audible: [Left:Yes] [Right:Yes] Blood Pressure: Brachial: [Left:168] Dorsalis Pedis: 138 Ankle: Posterior Tibial: 160 Ankle Brachial Index: [Left:0.95] Notes right leg was too painful while pumping to finish Electronic Signature(s) Signed: 07/14/2020 4:34:44 PM By: Army Melia Entered By: Army Melia on 07/14/2020 15:22:45 [name] ([id number]) -------------------------------------------------------------------------------- Multi Wound Chart Details [name] Date of Service: 07/14/2020 2:45 PM Medical Record Number: [id number] Patient Account Number: [id number] Date of Birth/Sex: April 13, 1934 (86 y.o. F) Treating RN: Cornell Barman Primary Care  Andrik Sandt: Fulton Reek Other Clinician: Referring Demarquis Osley: Fulton Reek Treating Emmaly Leech/Extender: Tito Dine in Treatment: 0 Vital Signs Height(in): 64 Pulse(bpm): 90 Weight(lbs): 205 Blood Pressure(mmHg): 138/77 Body Mass Index(BMI): 35 Temperature(F): 97.9 Respiratory Rate(breaths/min): 16 Photos: [N/A:N/A] Wound Location: Right, Dorsal Foot N/A N/A Wounding Event: Blister N/A N/A Primary Etiology: Diabetic Wound/Ulcer of the Lower N/A N/A Extremity Comorbid History: Congestive Heart Failure, N/A N/A Hypertension, Type II Diabetes, Received Radiation Date Acquired: 07/02/2020 N/A N/A Weeks of Treatment: 0 N/A N/A Wound Status: Open N/A N/A Measurements L x W x D (cm) 6x5.5x0.1 N/A N/A Area (cm) : 25.918 N/A N/A Volume (cm) : 2.592 N/A N/A Classification: Grade 2 N/A N/A Exudate Amount: Medium N/A N/A Exudate Type: Serosanguineous N/A N/A Exudate Color: red, brown N/A N/A Wound Margin: Flat and Intact N/A N/A Granulation Amount: Medium (34-66%) N/A N/A Granulation Quality: Red N/A N/A Necrotic  Amount: Small (1-33%) N/A N/A Exposed Structures: Fat Layer (Subcutaneous Tissue): N/A N/A Yes Fascia: No Tendon: No Muscle: No Joint: No Bone: No Epithelialization: None N/A N/A Procedures Performed: Compression Therapy N/A N/A Treatment Notes Wound #1 (Right, Dorsal Foot) Notes SIlver Alginate, Xtrasorb, 3 layer bilateral for swelling KAYELYNN, ABDOU ([id number]) Electronic Signature(s) Signed: 07/14/2020 4:31:41 PM By: Linton Ham MD Entered By: Linton Ham on 07/14/2020 15:49:45 [name] ([id number]) -------------------------------------------------------------------------------- [place] Details [name] Date of Service: 07/14/2020 2:45 PM Medical Record Number: [id number] Patient Account Number: [id number] Date of Birth/Sex: 1933-10-18 (86 y.o. F) Treating RN: Cornell Barman Primary Care [place]: Fulton Reek Other Clinician: Referring Kyrus Hyde: Fulton Reek Treating Laiken Sandy/Extender: Tito Dine in Treatment: 0 Active Inactive Necrotic Tissue Nursing Diagnoses: Impaired tissue integrity related to necrotic/devitalized tissue Goals: Necrotic/devitalized tissue will be minimized in the wound bed Date Initiated: 07/14/2020 Target Resolution Date: 08/14/2020 Goal Status: Active Interventions: Assess patient pain level pre-, during and post procedure and prior to discharge Treatment Activities: Apply topical anesthetic as ordered : 07/14/2020 Notes: Orientation to the Wound Care Program Nursing Diagnoses: Knowledge deficit related to the wound healing center program Goals: Patient/caregiver will verbalize understanding of the [place] Date Initiated: 07/14/2020 Target Resolution Date: 08/13/2020 Goal Status: Active Interventions: Provide education on orientation to the wound center Notes: Wound/Skin Impairment Nursing Diagnoses: Impaired tissue integrity Goals: Patient/caregiver will verbalize understanding of skin care regimen Date Initiated: 07/14/2020 Target Resolution Date: 07/14/2020 Goal Status: Active Interventions: Assess ulceration(s) every visit Treatment Activities: Topical wound management initiated : 07/14/2020 Notes: Electronic Signature(s) Signed: 07/19/2020 10:00:34 AM By: Gretta Cool, BSN, RN, CWS, Kim RN, BSN Entered By: Gretta Cool, BSN, RN, CWS, Kim on 07/14/2020 15:34:06 [name] ([id number] Weiss ([id number]) -------------------------------------------------------------------------------- Pain Assessment Details [name] Date of Service: 07/14/2020 2:45 PM Medical Record Number: [id number] Patient Account Number: [id number] Date of Birth/Sex: 11-Mar-1934 (86 y.o. F) Treating RN: Army Melia Primary Care Yanitza Shvartsman: Fulton Reek Other  Clinician: Referring Alexios Keown: Fulton Reek Treating Finnegan Gatta/Extender: Tito Dine in Treatment: 0 Active Problems Location of Pain Severity and Description of Pain Patient Has Paino No Site Locations Pain Management and Medication Current Pain Management: Electronic Signature(s) Signed: 07/14/2020 4:34:44 PM By: Army Melia Entered By: Army Melia on 07/14/2020 15:06:51 [name] ([id number]) -------------------------------------------------------------------------------- Patient/Caregiver Education Details [name] Date of Service: 07/14/2020 2:45 PM Medical Record Number: [id number] Patient Account Number: [id number] Date of Birth/Gender: 1934/04/02 (86 y.o. F) Treating RN: Cornell Barman Primary Care Physician: Fulton Reek Other Clinician: Referring Physician: Fulton Reek Treating Physician/Extender: Tito Dine in Treatment: 0 Education Assessment Education Provided To: Patient Education Topics Provided Welcome To The [place]: Handouts: Welcome To [facility] [place] Methods: Demonstration, Explain/Verbal Responses: State content correctly Wound/Skin Impairment: Handouts: Caring for Your Ulcer Methods: Demonstration, Explain/Verbal Responses: State content correctly Electronic Signature(s) Signed: 07/19/2020 10:00:34 AM By: Gretta Cool, BSN, RN, CWS, Kim RN, BSN Entered By: Gretta Cool, BSN, RN, CWS, Kim on 07/14/2020 15:39:50 [name] ([id number]) -------------------------------------------------------------------------------- Wound Assessment Details [name] Date of Service: 07/14/2020 2:45 PM Medical Record Number: [id number] Patient Account Number: [id number] Date of Birth/Sex: 1933/11/24 (86 y.o. F) Treating RN: Army Melia Primary Care Laiah Pouncey: Fulton Reek Other Clinician: Referring Lanora Reveron: Fulton Reek Treating Chantz Montefusco/Extender: Tito Dine in Treatment: 0 Wound Status Wound Number: 1 Primary Diabetic Wound/Ulcer of the Lower Extremity Etiology: Wound  Location: Right, Dorsal Foot Wound Status: Open Wounding Event: Blister Comorbid Congestive Heart Failure, Hypertension, Type II Date Acquired: 07/02/2020 History: Diabetes, Received Radiation Weeks Of Treatment: 0 Clustered Wound: No Photos Wound Measurements Length: (cm) 6 Width: (cm) 5.5 Depth: (cm) 0.1 Area: (cm) 25.918 Volume: (cm) 2.592 % Reduction in Area: % Reduction in Volume: Epithelialization: None Tunneling: No Undermining: No Wound Description Classification: Grade 2 Wound Margin: Flat and Intact Exudate Amount: Medium Exudate Type: Serosanguineous Exudate Color: red, brown Foul Odor After Cleansing: No Slough/Fibrino Yes Wound Bed Granulation Amount: Medium (34-66%) Exposed Structure Granulation Quality: Red Fascia Exposed: No Necrotic Amount: Small (1-33%) Fat Layer (Subcutaneous Tissue) Exposed: Yes Necrotic Quality: Adherent Slough Tendon Exposed: No Muscle Exposed: No Joint Exposed: No Bone Exposed: No Electronic Signature(s) Signed: 07/14/2020 4:34:44 PM By: Army Melia Entered By: Army Melia on 07/14/2020 15:15:51 [name] ([id number]) -------------------------------------------------------------------------------- Vitals Details [name] Date of Service: 07/14/2020 2:45 PM Medical Record Number: [id number] Patient Account Number: [id number] Date of Birth/Sex: May 01, 1934 (86 y.o. F) Treating RN: Army Melia Primary Care [place]: Fulton Reek Other Clinician: Referring Earnestene Angello: Fulton Reek Treating Kimani Bedoya/Extender: Tito Dine in Treatment: 0 Vital Signs Time Taken: 15:06 Temperature (F): 97.9 Height (in): 64 Pulse (bpm): 90 Source: Stated Respiratory Rate (breaths/min): 16 Weight (lbs): 205 Blood Pressure (mmHg): 138/77 Source: Stated Reference  Range: 80 - 120 mg / dl Body Mass Index (BMI): 35.2 Electronic Signature(s) Signed: 07/14/2020 4:34:44 PM By: Army Melia Entered By: Army Melia on 07/14/2020 15:07:20

## 2020-07-19 NOTE — Progress Notes (Signed)
[name], [name] ([id number]) Visit Report for 07/14/2020 Chief Complaint Document Details [name], [name] Date of Service: 07/14/2020 2:45 PM Medical Record Number: [id number] Patient Account Number: [id number] Date of Birth/Sex: 08-30-1934 (86 y.o. F) Treating RN: Cornell Barman Primary Care Provider: Fulton Reek Other Clinician: Referring Provider: Fulton Reek Treating Provider/Extender: Tito Dine in Treatment: 0 Information Obtained from: Patient Chief Complaint 07/14/2020; patient sent over here for a hemorrhagic blister on the right dorsal foot by her primary doctor Dr. Doy Hutching at [facility]. Electronic Signature(s) Signed: 07/14/2020 4:31:41 PM By: Linton Ham MD Entered By: Linton Ham on 07/14/2020 15:50:21 [name] ([id number]) -------------------------------------------------------------------------------- HPI Details [name] Date of Service: 07/14/2020 2:45 PM Medical Record Number: [id number] Patient Account Number: [id number] Date of Birth/Sex: 13-Apr-1934 (86 y.o. F) Treating RN: Cornell Barman Primary Care Provider: Fulton Reek Other Clinician: Referring Provider: Fulton Reek Treating Provider/Extender: Tito Dine in Treatment: 0 History of Present Illness HPI Description: ADMISSION 07/14/2020. This is a medically complex 86 year old woman who is sent over urgently from her primary care doctor Dr. Doy Hutching at [facility] and we worked her in this afternoon. She is apparently developed a blister on the dorsal foot sometime over the last 2 weeks on the right which seems to have developed a hemorrhagic component to this. The history here is somewhat vague but this is happened since she left the hospital on 07/02/2020. She also has a blistered area on the left dorsal foot although this does not have a hemorrhagic component to it and it is not threatened to open where is  the right side is definitely leaking fluid and could easily break down. I do not believe she has been doing anything specific to this. With regards to her medical history I have reviewed the discharge summary from the hospital from 06/22/2020 through 07/02/2020. 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 She was given prednisone but was not felt to be a candidate for immunosuppression secondary to her age and comorbidities. She is ANA positive p-ANCA positive. She had a right IJ catheter placed on 9/24 and is being done on dialysis. She is a diabetic. She takes prednisone 20 mg twice daily. She was on aspirin and Plavix although I think these have been put on hold. Past medical history includes; pauci-immune crescentic glomerulonephritis. History of peripheral edema and type 2 diabetes left carotid artery stenosis with a history of a CVA, coronary artery disease and hypertension I cannot see any arterial evaluation in her record in [place] link. We could not do arterial ABIs on her because of complaints of discomfort Electronic Signature(s) Signed: 07/14/2020 4:31:41 PM By: Linton Ham MD Entered By: Linton Ham on 07/14/2020 15:54:46 [name] ([id number]) -------------------------------------------------------------------------------- Physical Exam Details [name] Date of Service: 07/14/2020 2:45 PM Medical Record Number: [id number] Patient Account Number: [id number] Date of Birth/Sex: 1934/07/16 (86 y.o. F) Treating RN: Cornell Barman Primary Care Provider: Fulton Reek Other Clinician: Referring Provider: Fulton Reek Treating Provider/Extender: Tito Dine in Treatment: 0 Constitutional Sitting or standing Blood Pressure is within target range for patient.. Pulse regular and within target range for patient.Marland Kitchen Respirations regular,  non- labored and within target range.. Temperature is normal and within the target range for the patient.Marland Kitchen appears in no distress. Cardiovascular Heart rhythm and rate regular, without murmur or gallop. JVP was not elevated however she does have coccyx  edema. Given through her swelling I was able to feel her posterior tibial and dorsalis pedis pulses on both sides. 4+ mostly pitting edema in the bilateral lower legs this does not seem to extend above her thighs. Integumentary (Hair, Skin) No primary skin issues are seen no rash.Marland Kitchen Psychiatric Vague historian. Notes Wound exam; she has no open wound per se however she does have a large blister on her dorsal foot distally. A large part of the skin over the top of this is not going to remain viable. There is weak and weeping fluid coming out from the lateral part of her foot and the lateral part of this blister. Notable for the fact that she also has a blister developing in the left dorsal foot in the same location although there is no leaking and no hemorrhagic component to this. Electronic Signature(s) Signed: 07/14/2020 4:31:41 PM By: Linton Ham MD Entered By: Linton Ham on 07/14/2020 15:56:48 [name] ([id number]) -------------------------------------------------------------------------------- Physician Orders Details [name] Date of Service: 07/14/2020 2:45 PM Medical Record Number: [id number] Patient Account Number: [id number] Date of Birth/Sex: [birth year] (86 y.o. F) Treating RN: Cornell Barman Primary Care Provider: Fulton Reek Other Clinician: Referring Provider: Fulton Reek Treating Provider/Extender: Tito Dine in Treatment: 0 Verbal / Phone Orders: No Diagnosis Coding Wound Cleansing Wound #1 Right,Dorsal Foot o Clean wound with Normal Saline. Anesthetic (add to Medication List) Wound #1 Right,Dorsal Foot o Topical Lidocaine 4% cream applied to wound bed  prior to debridement (In Clinic Only). Primary Wound Dressing Wound #1 Right,Dorsal Foot o Silver Alginate Secondary Dressing Wound #1 Right,Dorsal Foot o XtraSorb Dressing Change Frequency Wound #1 Right,Dorsal Foot o Change Dressing Monday, Wednesday, Friday Follow-up Appointments Wound #1 Right,Dorsal Foot o Return Appointment in 1 week. Edema Control Wound #1 Right,Dorsal Foot o 3 Layer Compression System - Bilateral Additional Orders / Instructions Wound #1 Right,Dorsal Foot o Increase protein intake. Home Health Wound #1 [place] for [place] Nurse may visit PRN to address patientos wound care needs. o FACE TO FACE ENCOUNTER: MEDICARE and MEDICAID PATIENTS: I certify that this patient is under my care and that I had a face-to-face encounter that meets the physician face-to-face encounter requirements with this patient on this date. The encounter with the patient was in whole or in part for the following MEDICAL CONDITION: (primary reason for [place]) MEDICAL NECESSITY: I certify, that based on my findings, NURSING services are a medically necessary home health service. HOME BOUND STATUS: I certify that my clinical findings support that this patient is homebound (i.e., Due to illness or injury, pt requires aid of supportive devices such as crutches, cane, wheelchairs, walkers, the use of special transportation or the assistance of another person to leave their place of residence. There is a normal inability to leave the home and doing so requires considerable and taxing effort. Other absences are for medical reasons / religious services and are infrequent or of short duration when for other reasons). o If current dressing causes regression in wound condition, may D/C ordered dressing product/s and apply Normal Saline Moist Dressing daily until next [place] / Other MD appointment. [place] of regression in wound condition at [phone]. o Please direct any NON-WOUND related issues/requests for orders to patient's Primary Care Physician HANNAN, TETZLAFF ([id number]) Electronic Signature(s) Signed: 07/14/2020 4:31:41 PM By: Linton Ham MD Signed: 07/19/2020 10:00:34 AM By: Gretta Cool, BSN, RN, CWS,  Maudie Mercury RN, BSN Entered By: Gretta Cool, BSN, RN, CWS, Kim on 07/14/2020 15:41:38 [name] ([id number]) -------------------------------------------------------------------------------- Problem List Details [name] Date of Service: 07/14/2020 2:45 PM Medical Record Number: [id number] Patient Account Number: [id number] Date of Birth/Sex: May 19, 1934 (86 y.o. F) Treating RN: Cornell Barman Primary Care Provider: Fulton Reek Other Clinician: Referring Provider: Fulton Reek Treating Provider/Extender: Tito Dine in Treatment: 0 Active Problems ICD-10 Encounter Code Description Active Date MDM Diagnosis S90.31XD Contusion of right foot, subsequent encounter 07/14/2020 No Yes S90.822D Blister (nonthermal), left foot, subsequent encounter 07/14/2020 No Yes E11.22 Type 2 diabetes mellitus with diabetic chronic kidney disease 07/14/2020 No Yes N18.5 Chronic kidney disease, stage 5 07/14/2020 No Yes Inactive Problems Resolved Problems Electronic Signature(s) Signed: 07/14/2020 4:31:41 PM By: Linton Ham MD Entered By: Linton Ham on 07/14/2020 15:49:31 [name] ([id number]) -------------------------------------------------------------------------------- Progress Note Details [name] Date of Service: 07/14/2020 2:45 PM Medical Record Number: [id number] Patient Account Number: [id number] Date of Birth/Sex: 27-Feb-1934 (86 y.o. F) Treating RN: Cornell Barman Primary Care Provider: Fulton Reek Other Clinician: Referring Provider: Fulton Reek Treating Provider/Extender:  Tito Dine in Treatment: 0 Subjective Chief Complaint Information obtained from Patient 07/14/2020; patient sent over here for a hemorrhagic blister on the right dorsal foot by her primary doctor Dr. Doy Hutching at [facility]. History of Present Illness (HPI) ADMISSION 07/14/2020. This is a medically complex 86 year old woman who is sent over urgently from her primary care doctor Dr. Doy Hutching at [facility] and we worked her in this afternoon. She is apparently developed a blister on the dorsal foot sometime over the last 2 weeks on the right which seems to have developed a hemorrhagic component to this. The history here is somewhat vague but this is happened since she left the hospital on 07/02/2020. She also has a blistered area on the left dorsal foot although this does not have a hemorrhagic component to it and it is not threatened to open where is the right side is definitely leaking fluid and could easily break down. I do not believe she has been doing anything specific to this. With regards to her medical history I have reviewed the discharge summary from the hospital from 06/22/2020 through 07/02/2020. 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 She was given prednisone but was not felt to be a candidate for immunosuppression secondary to her age and comorbidities. She is ANA positive p-ANCA positive. She had a right IJ catheter placed on 9/24 and is being done on dialysis. She is a diabetic. She takes prednisone 20 mg twice daily. She was on aspirin and Plavix although I think these have been put on hold. Past medical history includes; pauci-immune crescentic glomerulonephritis. History of peripheral edema and type 2 diabetes left carotid artery stenosis with a history of a CVA, coronary artery disease and hypertension I cannot see any arterial evaluation in her record in [place]  link. We could not do arterial ABIs on her because of complaints of discomfort Patient History Information obtained from Patient. Allergies No Known Allergies Family History No family history of Cancer, Diabetes, Heart Disease, Hereditary Spherocytosis, Hypertension, Kidney Disease, Lung Disease, Seizures, Stroke, Thyroid Problems, Tuberculosis. Social History Former smoker - 1 years, Marital Status - Married, Alcohol Use - Never, Drug Use - No History, Caffeine Use - Never. Medical History Cardiovascular Patient has history of Congestive Heart Failure, Hypertension Denies history of Angina,  Arrhythmia, Hypotension, Myocardial Infarction, Peripheral Arterial Disease, Peripheral Venous Disease, Phlebitis Endocrine Patient has history of Type II Diabetes Oncologic Patient has history of Received Radiation Patient is treated with Oral Agents. Blood sugar is tested. Hospitalization/Surgery History - [facility]. Review of Systems (ROS) Constitutional Symptoms (General Health) Denies complaints or symptoms of Fatigue, Fever, Chills, Marked Weight Change. Eyes Denies complaints or symptoms of Dry Eyes, Vision Changes, Glasses / Contacts. Ear/Nose/Mouth/Throat Denies complaints or symptoms of Difficult clearing ears, Sinusitis. Hematologic/Lymphatic Denies complaints or symptoms of Bleeding / Clotting Disorders, Human Immunodeficiency Virus. Respiratory Denies complaints or symptoms of Chronic or frequent coughs, Shortness of Breath. Gastrointestinal Denies complaints or symptoms of Frequent diarrhea, Nausea, Vomiting. [name] ([id number]) Endocrine Denies complaints or symptoms of Hepatitis, Thyroid disease, Polydypsia (Excessive Thirst). Genitourinary Complains or has symptoms of Kidney failure/ Dialysis. Immunological Denies complaints or symptoms of Hives, Itching. Integumentary (Skin) Denies complaints or symptoms of Wounds, Bleeding or bruising tendency, Breakdown,  Swelling. Musculoskeletal Denies complaints or symptoms of Muscle Pain, Muscle Weakness. Neurologic Denies complaints or symptoms of Numbness/parasthesias, Focal/Weakness. Oncologic breast cancer Psychiatric Denies complaints or symptoms of Anxiety, Claustrophobia. Objective Constitutional Sitting or standing Blood Pressure is within target range for patient.. Pulse regular and within target range for patient.Marland Kitchen Respirations regular, non- labored and within target range.. Temperature is normal and within the target range for the patient.Marland Kitchen appears in no distress. Vitals Time Taken: 3:06 PM, Height: 64 in, Source: Stated, Weight: 205 lbs, Source: Stated, BMI: 35.2, Temperature: 97.9 F, Pulse: 90 bpm, Respiratory Rate: 16 breaths/min, Blood Pressure: 138/77 mmHg. Cardiovascular Heart rhythm and rate regular, without murmur or gallop. JVP was not elevated however she does have coccyx edema. Given through her swelling I was able to feel her posterior tibial and dorsalis pedis pulses on both sides. 4+ mostly pitting edema in the bilateral lower legs this does not seem to extend above her thighs. Psychiatric Vague historian. General Notes: Wound exam; she has no open wound per se however she does have a large blister on her dorsal foot distally. A large part of the skin over the top of this is not going to remain viable. There is weak and weeping fluid coming out from the lateral part of her foot and the lateral part of this blister. Notable for the fact that she also has a blister developing in the left dorsal foot in the same location although there is no leaking and no hemorrhagic component to this. Integumentary (Hair, Skin) No primary skin issues are seen no rash.. Wound #1 status is Open. Original cause of wound was Blister. The wound is located on the Right,Dorsal Foot. The wound measures 6cm length x 5.5cm width x 0.1cm depth; 25.918cm^2 area and 2.592cm^3 volume. There is Fat Layer  (Subcutaneous Tissue) exposed. There is no tunneling or undermining noted. There is a medium amount of serosanguineous drainage noted. The wound margin is flat and intact. There is medium (34- 66%) red granulation within the wound bed. There is a small (1-33%) amount of necrotic tissue within the wound bed including Adherent Slough. Assessment Active Problems ICD-10 Contusion of right foot, subsequent encounter Blister (nonthermal), left foot, subsequent encounter Type 2 diabetes mellitus with diabetic chronic kidney disease Chronic kidney disease, stage 5 Procedures [name], Sarah T. ([id number]) Wound #1 Pre-procedure diagnosis of Wound #1 is a Diabetic Wound/Ulcer of the Lower Extremity located on the Right,Dorsal Foot . There was a Three Layer Compression Therapy Procedure with a pre-treatment ABI of 1 by Cornell Barman,  RN. Post procedure Diagnosis Wound #1: Same as Pre-Procedure Plan Wound Cleansing: Wound #1 Right,Dorsal Foot: Clean wound with Normal Saline. Anesthetic (add to Medication List): Wound #1 Right,Dorsal Foot: Topical Lidocaine 4% cream applied to wound bed prior to debridement (In Clinic Only). Primary Wound Dressing: Wound #1 Right,Dorsal Foot: Silver Alginate Secondary Dressing: Wound #1 Right,Dorsal Foot: XtraSorb Dressing Change Frequency: Wound #1 Right,Dorsal Foot: Change Dressing Monday, Wednesday, Friday Follow-up Appointments: Wound #1 Right,Dorsal Foot: Return Appointment in 1 week. Edema Control: Wound #1 Right,Dorsal Foot: 3 Layer Compression System - Bilateral Additional Orders / Instructions: Wound #1 Right,Dorsal Foot: Increase protein intake. Home Health: Wound #1 Right,Dorsal Foot: [place] for [place] Nurse may visit PRN to address patient s wound care needs. FACE TO FACE ENCOUNTER: MEDICARE and MEDICAID PATIENTS: I certify that this patient is under my care and that I had a face-to-face encounter that  meets the physician face-to-face encounter requirements with this patient on this date. The encounter with the patient was in whole or in part for the following MEDICAL CONDITION: (primary reason for [place]) MEDICAL NECESSITY: I certify, that based on my findings, NURSING services are a medically necessary home health service. HOME BOUND STATUS: I certify that my clinical findings support that this patient is homebound (i.e., Due to illness or injury, pt requires aid of supportive devices such as crutches, cane, wheelchairs, walkers, the use of special transportation or the assistance of another person to leave their place of residence. There is a normal inability to leave the home and doing so requires considerable and taxing effort. Other absences are for medical reasons / religious services and are infrequent or of short duration when for other reasons). If current dressing causes regression in wound condition, may D/C ordered dressing product/s and apply Normal Saline Moist Dressing daily until next [place] / Other MD appointment. [place] of regression in wound condition at [phone]. Please direct any NON-WOUND related issues/requests for orders to patient's Primary Care Physician 1. I am going to use silver alginate, Xtrasorb, ABDs under 3 layer compression bilaterally. 2. Although her feet were cold I could feel her peripheral pulses and I think she should be able to tolerate this. We could not test her ABIs here because of discomfort 3. My hope is to get some of the fluid out of the dorsal foot. There is definitely going to be an opening for a wound here at some point but I think with less edema this is likely to be less ominous of a problem. I do expect this to open at some point however. 4. She has a similar situation developing on the left foot. 5. I wonder about whether she is going to need to be more aggressively dialyzed and whether she would  tolerate this. 6. She is also been on prednisone at high doses secondary to her glomerulonephritis no doubt this is contributed to this condition. Electronic Signature(s) Signed: 07/14/2020 4:31:41 PM By: Linton Ham MD Entered By: Linton Ham on 07/14/2020 16:01:44 [name] ([id number]) -------------------------------------------------------------------------------- ROS/PFSH Details [name] Date of Service: 07/14/2020 2:45 PM Medical Record Number: [id number] Patient Account Number: [id number] Date of Birth/Sex: July 23, 1934 (86 y.o. F) Treating RN: Army Melia Primary Care Provider: Fulton Reek Other Clinician: Referring Provider: Fulton Reek Treating Provider/Extender: Tito Dine in Treatment: 0 Information Obtained From Patient Constitutional Symptoms (General Health) Complaints and Symptoms: Negative for: Fatigue; Fever; Chills; Marked Weight Change Eyes  Complaints and Symptoms: Negative for: Dry Eyes; Vision Changes; Glasses / Contacts Ear/Nose/Mouth/Throat Complaints and Symptoms: Negative for: Difficult clearing ears; Sinusitis Hematologic/Lymphatic Complaints and Symptoms: Negative for: Bleeding / Clotting Disorders; Human Immunodeficiency Virus Respiratory Complaints and Symptoms: Negative for: Chronic or frequent coughs; Shortness of Breath Gastrointestinal Complaints and Symptoms: Negative for: Frequent diarrhea; Nausea; Vomiting Endocrine Complaints and Symptoms: Negative for: Hepatitis; Thyroid disease; Polydypsia (Excessive Thirst) Medical History: Positive for: Type II Diabetes Treated with: Oral agents Blood sugar tested every day: Yes Tested : Genitourinary Complaints and Symptoms: Positive for: Kidney failure/ Dialysis Immunological Complaints and Symptoms: Negative for: Hives; Itching Integumentary (Skin) Complaints and Symptoms: Negative for: Wounds; Bleeding or bruising tendency;  Breakdown; Swelling [name], Sarah T. ([id number]) Musculoskeletal Complaints and Symptoms: Negative for: Muscle Pain; Muscle Weakness Neurologic Complaints and Symptoms: Negative for: Numbness/parasthesias; Focal/Weakness Psychiatric Complaints and Symptoms: Negative for: Anxiety; Claustrophobia Cardiovascular Medical History: Positive for: Congestive Heart Failure; Hypertension Negative for: Angina; Arrhythmia; Hypotension; Myocardial Infarction; Peripheral Arterial Disease; Peripheral Venous Disease; Phlebitis Oncologic Complaints and Symptoms: Review of System Notes: breast cancer Medical History: Positive for: Received Radiation Immunizations Pneumococcal Vaccine: Received Pneumococcal Vaccination: No Implantable Devices None Hospitalization / Surgery History Type of Hospitalization/Surgery [facility] Family and Social History Cancer: No; Diabetes: No; Heart Disease: No; Hereditary Spherocytosis: No; Hypertension: No; Kidney Disease: No; Lung Disease: No; Seizures: No; Stroke: No; Thyroid Problems: No; Tuberculosis: No; Former smoker - 55 years; Marital Status - Married; Alcohol Use: Never; Drug Use: No History; Caffeine Use: Never; Financial Concerns: No; Food, Clothing or Shelter Needs: No; Support System Lacking: No; Transportation Concerns: No Electronic Signature(s) Signed: 07/14/2020 4:31:41 PM By: Linton Ham MD Signed: 07/14/2020 4:34:44 PM By: Army Melia Entered By: Army Melia on 07/14/2020 15:11:40 [name] ([id number]) -------------------------------------------------------------------------------- SuperBill Details [name] Date of Service: 07/14/2020 Medical Record Number: [id number] Patient Account Number: [id number] Date of Birth/Sex: December 16, 1933 (86 y.o. F) Treating RN: Cornell Barman Primary Care Provider: Fulton Reek Other Clinician: Referring Provider: Fulton Reek Treating Provider/Extender: Tito Dine in Treatment: 0 Diagnosis Coding ICD-10 Codes Code Description S90.31XD Contusion of right foot, subsequent encounter S90.822D Blister (nonthermal), left foot, subsequent encounter E11.22 Type 2 diabetes mellitus with diabetic chronic kidney disease N18.5 Chronic kidney disease, stage 5 Facility Procedures CPT4: Description Modifier Quantity Code [id number] - WOUND CARE VISIT-LEV 3 EST PT 1 CPT4: 97353299 [id number] BILATERAL: Application of multi-layer venous compression system; leg (below knee), including 1 ankle and foot. Physician Procedures CPT4 Code: [id number] Description: 29798 - WC PHYS LEVEL 4 - NEW PT Modifier: Quantity: 1 CPT4 Code: Description: ICD-10 Diagnosis Description S90.31XD Contusion of right foot, subsequent encounter S90.822D Blister (nonthermal), left foot, subsequent encounter E11.22 Type 2 diabetes mellitus with diabetic chronic kidney disease N18.5 Chronic kidney  disease, stage 5 Modifier: Quantity: Electronic Signature(s) Signed: 07/14/2020 4:31:41 PM By: Linton Ham MD Entered By: Linton Ham on 07/14/2020 16:02:21

## 2020-07-21 ENCOUNTER — Encounter: Payer: Medicare Other | Admitting: Internal Medicine

## 2020-07-21 ENCOUNTER — Other Ambulatory Visit: Payer: Self-pay

## 2020-07-21 DIAGNOSIS — E11621 Type 2 diabetes mellitus with foot ulcer: Secondary | ICD-10-CM | POA: Diagnosis not present

## 2020-07-21 NOTE — Progress Notes (Signed)
[name] ([id number]) Visit Report for 07/19/2020 Arrival Information Details [name] Date of Service: 07/19/2020 12:45 PM Medical Record Number: [id number] Patient Account Number: [id number] Date of Birth/Sex: [birth year] (85 y.o. F) Treating RN: Carlene Coria Primary Care Courtnei Ruddell: Fulton Reek Other Clinician: Referring Sione Baumgarten: Fulton Reek Treating Felis Quillin/Extender: Melburn Hake, HOYT Weeks in Treatment: 0 Visit Information History Since Last Visit All ordered tests and consults were completed: No Patient Arrived: Wheel Chair Added or deleted any medications: No Arrival Time: 13:32 Any new allergies or adverse reactions: No Accompanied By: spouse Had a fall or experienced change in No Transfer Assistance: None activities of daily living that may affect Patient Identification Verified: Yes risk of falls: Secondary Verification Process Completed: Yes Signs or symptoms of abuse/neglect since last visito No Patient Requires Transmission-Based Precautions: No Hospitalized since last visit: No Patient Has Alerts: No Implantable device outside of the clinic excluding No cellular tissue based products placed in the center since last visit: Has Dressing in Place as Prescribed: Yes Has Compression in Place as Prescribed: Yes Pain Present Now: Yes Electronic Signature(s) Signed: 07/21/2020 4:54:21 PM By: Carlene Coria RN Entered By: Carlene Coria on 07/19/2020 13:33:14 [name] ([id number]) -------------------------------------------------------------------------------- Compression Therapy Details [name] Date of Service: 07/19/2020 12:45 PM Medical Record Number: [id number] Patient Account Number: [id number] Date of Birth/Sex: 27-Jun-1934 (85 y.o. F) Treating RN: Carlene Coria Primary Care Markeise Mathews: Fulton Reek Other Clinician: Referring Cache Bills: Fulton Reek Treating Ineze Serrao/Extender: Melburn Hake,  HOYT Weeks in Treatment: 0 Compression Therapy Performed for Wound Assessment: Wound #1 Right,Dorsal Foot Performed By: Clinician Carlene Coria, RN Compression Type: Three Layer Electronic Signature(s) Signed: 07/21/2020 4:54:21 PM By: Carlene Coria RN Entered By: Carlene Coria on 07/19/2020 13:34:37 [name] ([id number]) -------------------------------------------------------------------------------- Encounter Discharge Information Details [name] Date of Service: 07/19/2020 12:45 PM Medical Record Number: [id number] Patient Account Number: [id number] Date of Birth/Sex: 11-13-1933 (85 y.o. F) Treating RN: Carlene Coria Primary Care Monasia Lair: Fulton Reek Other Clinician: Referring Xylan Sheils: Fulton Reek Treating Mattie Nordell/Extender: Melburn Hake, HOYT Weeks in Treatment: 0 Encounter Discharge Information Items Discharge Condition: Stable Ambulatory Status: Wheelchair Discharge Destination: Home Transportation: Private Auto Accompanied By: spouse Schedule Follow-up Appointment: Yes Clinical Summary of Care: Patient Declined Electronic Signature(s) Signed: 07/21/2020 4:54:21 PM By: Carlene Coria RN Entered By: Carlene Coria on 07/19/2020 13:35:53 [name] ([id number]) -------------------------------------------------------------------------------- Wound Assessment Details [name] Date of Service: 07/19/2020 12:45 PM Medical Record Number: [id number] Patient Account Number: [id number] Date of Birth/Sex: 1934/01/07 (85 y.o. F) Treating RN: Carlene Coria Primary Care Benjy Kana: Fulton Reek Other Clinician: Referring Reed Eifert: Fulton Reek Treating Rajat Staver/Extender: Melburn Hake, HOYT Weeks in Treatment: 0 Wound Status Wound Number: 1 Primary Diabetic Wound/Ulcer of the Lower Extremity Etiology: Wound Location: Right, Dorsal Foot Wound Status: Open Wounding Event: Blister Comorbid Congestive Heart Failure,  Hypertension, Type II Date Acquired: 07/02/2020 History: Diabetes, Received Radiation Weeks Of Treatment: 0 Clustered Wound: No Wound Measurements Length: (cm) 5.5 Width: (cm) 4.5 Depth: (cm) 0.1 Area: (cm) 19.439 Volume: (cm) 1.944 % Reduction in Area: 25% % Reduction in Volume: 25% Epithelialization: None Tunneling: No Undermining: No Wound Description Classification: Grade 2 Wound Margin: Flat and Intact Exudate Amount: Large Exudate Type: Serosanguineous Exudate Color: red, brown Foul Odor After Cleansing: No Slough/Fibrino Yes Wound Bed Granulation Amount: Small (1-33%) Exposed Structure Granulation Quality: Red Fascia Exposed: No Necrotic Amount: Large (67-100%) Fat Layer (Subcutaneous Tissue) Exposed: Yes Necrotic Quality: Adherent Slough Tendon Exposed:  No Muscle Exposed: No Joint Exposed: No Bone Exposed: No Electronic Signature(s) Signed: 07/21/2020 4:54:21 PM By: Carlene Coria RN Entered By: Carlene Coria on 07/19/2020 13:34:16

## 2020-07-21 NOTE — Progress Notes (Signed)
MISSIE, GEHRIG ([id number]) Visit Report for 07/16/2020 Physician Orders Details [name], [name] Date of Service: 07/16/2020 10:30 AM Medical Record Number: [id number] Patient Account Number: [id number] Date of Birth/Sex: 1934-06-21 (85 y.o. F) Treating RN: Cornell Barman Primary Care Provider: Fulton Reek Other Clinician: Referring Provider: Fulton Reek Treating Provider/Extender: Melburn Hake, Leahna Hewson Weeks in Treatment: 0 Verbal / Phone Orders: No Diagnosis Coding Wound Cleansing Wound #1 Right,Dorsal Foot o Clean wound with Normal Saline. Anesthetic (add to Medication List) Wound #1 Right,Dorsal Foot o Topical Lidocaine 4% cream applied to wound bed prior to debridement (In Clinic Only). Primary Wound Dressing Wound #1 Right,Dorsal Foot o Silver Alginate Secondary Dressing Wound #1 Right,Dorsal Foot o XtraSorb Dressing Change Frequency Wound #1 Right,Dorsal Foot o Change Dressing Monday, Wednesday, Friday Follow-up Appointments Wound #1 Right,Dorsal Foot o Return Appointment in 1 week. Edema Control Wound #1 Right,Dorsal Foot o 3 Layer Compression System - Bilateral Additional Orders / Instructions Wound #1 Right,Dorsal Foot o Increase protein intake. Home Health Wound #1 [facility] - Dr, Doy Hutching is initiating [place] Nurse may visit PRN to address patientos wound care needs. o FACE TO FACE ENCOUNTER: MEDICARE and MEDICAID PATIENTS: I certify that this patient is under my care and that I had a face-to-face encounter that meets the physician face-to-face encounter requirements with this patient on this date. The encounter with the patient was in whole or in part for the following MEDICAL CONDITION: (primary reason for [place]) MEDICAL NECESSITY: I certify, that based on my findings, NURSING services are a medically necessary home health service. HOME BOUND STATUS:  I certify that my clinical findings support that this patient is homebound (i.e., Due to illness or injury, pt requires aid of supportive devices such as crutches, cane, wheelchairs, walkers, the use of special transportation or the assistance of another person to leave their place of residence. There is a normal inability to leave the home and doing so requires considerable and taxing effort. Other absences are for medical reasons / religious services and are infrequent or of short duration when for other reasons). o If current dressing causes regression in wound condition, may D/C ordered dressing product/s and apply Normal Saline Moist Dressing daily until next [place] / Other MD appointment. Wallace of regression in wound condition at [phone]. KIANI, WURTZEL ([id number]) o Please direct any NON-WOUND related issues/requests for orders to patient's Primary Care Physician Laboratory o Bacteria identified in Wound by Culture (MICRO) oooo LOINC Code: 3559-7 CBUL Convenience Name: Wound culture routine Electronic Signature(s) Signed: 07/16/2020 11:11:40 AM By: Gretta Cool, BSN, RN, CWS, Kim RN, BSN Signed: 07/21/2020 4:38:25 PM By: Worthy Keeler PA-C Entered By: Gretta Cool, BSN, RN, CWS, Kim on 07/16/2020 11:11:39 [name] ([id number]) -------------------------------------------------------------------------------- SuperBill Details Patient Name: [name] Date of Service: 07/16/2020 Medical Record Number: [id number] Patient Account Number: [id number] Date of Birth/Sex: Sep 12, 1934 (85 y.o. F) Treating RN: Cornell Barman Primary Care Provider: Fulton Reek Other Clinician: Referring Provider: Fulton Reek Treating Provider/Extender: Melburn Hake, Eliane Hammersmith Weeks in Treatment: 0 Diagnosis Coding ICD-10 Codes Code Description S90.31XD Contusion of right foot, subsequent encounter S90.822D Blister (nonthermal), left foot, subsequent  encounter E11.22 Type 2 diabetes mellitus with diabetic chronic kidney disease N18.5 Chronic kidney disease, stage 5 Facility Procedures CPT4: Description Modifier Quantity Code [id number] BILATERAL: Application of multi-layer venous compression system; leg (below knee), including 1 ankle and foot. Electronic Signature(s) Signed: 07/16/2020  11:10:25 AM By: Gretta Cool, BSN, RN, CWS, Kim RN, BSN Signed: 07/21/2020 4:38:25 PM By: Worthy Keeler PA-C Entered By: Gretta Cool BSN, RN, CWS, Kim on 07/16/2020 11:10:24

## 2020-07-22 LAB — AEROBIC CULTURE W GRAM STAIN (SUPERFICIAL SPECIMEN)

## 2020-07-23 NOTE — Progress Notes (Signed)
[name], [name] ([id number]) Visit Report for 07/21/2020 Debridement Details [name] Date of Service: 07/21/2020 1:15 PM Medical Record Number: [id number] Patient Account Number: [id number] Date of Birth/Sex: [birth year] (86 y.o. F) Treating RN: Cornell Barman Primary Care Provider: Fulton Reek Other Clinician: Referring Provider: Fulton Reek Treating Provider/Extender: Tito Dine in Treatment: 1 Debridement Performed for Wound #1 Right,Dorsal Foot Assessment: Performed By: Physician Ricard Dillon, MD Debridement Type: Debridement Severity of Tissue Pre Debridement: Necrosis of muscle Level of Consciousness (Pre- Awake and Alert procedure): Pre-procedure Verification/Time Out Yes - 13:45 Taken: Total Area Debrided (L x W): 6.5 (cm) x 3.5 (cm) = 22.75 (cm) Tissue and other material Viable, Non-Viable, Fat, Muscle, Subcutaneous debrided: Level: Skin/Subcutaneous Tissue/Muscle Debridement Description: Excisional Instrument: Blade, Curette, Forceps Bleeding: Large Hemostasis Achieved: Silver Nitrate Response to Treatment: Procedure was tolerated well Level of Consciousness (Post- Awake and Alert procedure): Post Debridement Measurements of Total Wound Length: (cm) 6 Width: (cm) 4 Depth: (cm) 1.2 Volume: (cm) 22.619 Character of Wound/Ulcer Post Debridement: Improved Severity of Tissue Post Debridement: Necrosis of muscle Post Procedure Diagnosis Same as Pre-procedure Electronic Signature(s) Signed: 07/22/2020 5:56:15 PM By: Gretta Cool, BSN, RN, CWS, Kim RN, BSN Signed: 07/23/2020 8:18:14 AM By: Linton Ham MD Entered By: Linton Ham on 07/21/2020 14:04:24 [name] ([id number]) -------------------------------------------------------------------------------- HPI Details [name] Date of Service: 07/21/2020 1:15 PM Medical Record Number: [id number] Patient Account Number:  [id number] Date of Birth/Sex: 02-09-1934 (86 y.o. F) Treating RN: Cornell Barman Primary Care Provider: Fulton Reek Other Clinician: Referring Provider: Fulton Reek Treating Provider/Extender: Tito Dine in Treatment: 1 History of Present Illness HPI Description: ADMISSION 07/14/2020. This is a medically complex 86 year old woman who is sent over urgently from her primary care doctor Dr. Doy Hutching at [facility] and we worked her in this afternoon. She is apparently developed a blister on the dorsal foot sometime over the last 2 weeks on the right which seems to have developed a hemorrhagic component to this. The history here is somewhat vague but this is happened since she left the hospital on 07/02/2020. She also has a blistered area on the left dorsal foot although this does not have a hemorrhagic component to it and it is not threatened to open where is the right side is definitely leaking fluid and could easily break down. I do not believe she has been doing anything specific to this. With regards to her medical history I have reviewed the discharge summary from the hospital from 06/22/2020 through 07/02/2020. 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 She was given prednisone but was not felt to be a candidate for immunosuppression secondary to her age and comorbidities. She is ANA positive p-ANCA positive. She had a right IJ catheter placed on 9/24 and is being done on dialysis. She is a diabetic. She takes prednisone 20 mg twice daily. She was on aspirin and Plavix although I think these have been put on hold. Past medical history includes; pauci-immune crescentic glomerulonephritis. History of peripheral edema and type 2 diabetes left carotid artery stenosis with a history of a CVA, coronary artery disease and hypertension I cannot see any arterial evaluation in her record in [facility]. We could not do arterial ABIs on her because of complaints of discomfort 10/20; 1 week follow-up. This is a patient who is a diabetic. She recently developed acute renal failure because of pauci-immune glomerulonephritis.  Sometime during her initial hospitalization she developed a wound on her right foot tremendous swelling in both legs. She was sent over here urgently last week by her primary care physician with what looks to be hematoma still contained on the right dorsal foot. We put silver alginate on this and put her in compression to control some of the swelling. The left leg had a similar appearance with severe edema in the left dorsal foot so we put that under compression as well. When she arrived back for nurse follow-up on Friday there was some drainage coming out of the wound which our nurse cultured. This is come back showing Pseudomonas Enterococcus faecalis and staph aureus although the identification of the staph aureus is not complete. I empirically put her on cefdinir 300 mg after dialysis on Tuesday Thursday and Saturday she is only had one dose. She has not been systemically unwell Electronic Signature(s) Signed: 07/23/2020 8:18:14 AM By: Linton Ham MD Entered By: Linton Ham on 07/21/2020 14:07:13 [name] ([id number]) -------------------------------------------------------------------------------- Physical Exam Details [name] Date of Service: 07/21/2020 1:15 PM Medical Record Number: [id number] Patient Account Number: [id number] Date of Birth/Sex: August 16, 1934 (86 y.o. F) Treating RN: Cornell Barman Primary Care Provider: Fulton Reek Other Clinician: Referring Provider: Fulton Reek Treating Provider/Extender: Tito Dine in Treatment: 1 Constitutional Sitting or standing Blood Pressure is within target range for patient.. Pulse regular and within target range for patient.Marland Kitchen Respirations regular,  non- labored and within target range.. Temperature is normal and within the target range for the patient.Marland Kitchen appears in no distress. Cardiovascular Fortunately her pedal pulses are robust to both the dorsalis pedis and posterior tibia. Edema in both legs still marked especially into the dorsal feet although improved from last week. Notes Wound exam; she arrives in clinic today with a necrotic surface over the large wound on her right dorsal foot. This was liquefying. I used pickups and a #15 scalpel to remove this. Copious amounts of necrotic debris, old hematoma. Quite malodorous. I used a #5 curette to remove as much of this from the surface as possible. This is a deep wound with exposed muscle but there is still necrotic material at the base of edema and with an extensive debridement today. There is some surrounding erythema and tenderness Electronic Signature(s) Signed: 07/23/2020 8:18:14 AM By: Linton Ham MD Entered By: Linton Ham on 07/21/2020 14:09:20 [name] ([id number]) -------------------------------------------------------------------------------- Physician Orders Details [name] Date of Service: 07/21/2020 1:15 PM Medical Record Number: [id number] Patient Account Number: [id number] Date of Birth/Sex: 08/04/1934 (86 y.o. F) Treating RN: Cornell Barman Primary Care Provider: Fulton Reek Other Clinician: Referring Provider: Fulton Reek Treating Provider/Extender: Tito Dine in Treatment: 1 Verbal / Phone Orders: No Diagnosis Coding Wound Cleansing Wound #1 Right,Dorsal Foot o Clean wound with Normal Saline. Anesthetic (add to Medication List) Wound #1 Right,Dorsal Foot o Topical Lidocaine 4% cream applied to wound bed prior to debridement (In Clinic Only). Primary Wound Dressing Wound #1 Right,Dorsal Foot o Silver Alginate Secondary Dressing Wound #1 Right,Dorsal Foot o XtraSorb Dressing Change  Frequency Wound #1 Right,Dorsal Foot o Change Dressing Monday, Wednesday, Friday Follow-up Appointments Wound #1 Right,Dorsal Foot o Return Appointment in 1 week. Edema Control Wound #1 Right,Dorsal Foot o 3 Layer Compression System - Bilateral Additional Orders / Instructions Wound #1 Right,Dorsal Foot o Increase protein intake. Home Health Wound #1 [place] Visits - [facility] Nurse [phone]  visit PRN to address patientos wound care needs. o FACE TO FACE ENCOUNTER: MEDICARE and MEDICAID PATIENTS: I certify that this patient is under my care and that I had a face-to-face encounter that meets the physician face-to-face encounter requirements with this patient on this date. The encounter with the patient was in whole or in part for the following MEDICAL CONDITION: (primary reason for [place]) MEDICAL NECESSITY: I certify, that based on my findings, NURSING services are a medically necessary home health service. HOME BOUND STATUS: I certify that my clinical findings support that this patient is homebound (i.e., Due to illness or injury, pt requires aid of supportive devices such as crutches, cane, wheelchairs, walkers, the use of special transportation or the assistance of another person to leave their place of residence. There is a normal inability to leave the home and doing so requires considerable and taxing effort. Other absences are for medical reasons / religious services and are infrequent or of short duration when for other reasons). o If current dressing causes regression in wound condition, may D/C ordered dressing product/s and apply Normal Saline Moist Dressing daily until next [place] / Other MD appointment. [place] of regression in wound condition at [phone]. o Please direct any NON-WOUND related issues/requests for orders to patient's Primary Care Physician KAYSEY, BERNDT ([id number]) Electronic Signature(s) Signed: 07/22/2020 5:56:15 PM By: Gretta Cool, BSN, RN, CWS, Kim RN, BSN Signed: 07/23/2020 8:18:14 AM By: Linton Ham MD Entered By: Gretta Cool, BSN, RN, CWS, Kim on 07/21/2020 14:16:33 [name] ([id number]) -------------------------------------------------------------------------------- Problem List Details [name] Date of Service: 07/21/2020 1:15 PM Medical Record Number: [id number] Patient Account Number: [id number] Date of Birth/Sex: [birth year] (87 y.o. 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 07/21/2020 No Yes muscle E11.22 Type 2 diabetes mellitus with diabetic chronic kidney disease 07/14/2020 No Yes N18.5 Chronic kidney disease, stage 5 07/14/2020 No Yes Inactive Problems Resolved Problems Electronic Signature(s) Signed: 07/23/2020 8:18:14 AM By: Linton Ham MD Entered By: Linton Ham on 07/21/2020 14:04:04 [name] ([id number]) -------------------------------------------------------------------------------- Progress Note Details [name] Date of Service: 07/21/2020 1:15 PM Medical Record Number: [id number] Patient Account Number: [id number] Date of Birth/Sex: 1934/03/15 (86 y.o. F) Treating RN: Cornell Barman Primary Care Provider: Fulton Reek Other Clinician: Referring Provider: Fulton Reek Treating Provider/Extender: Tito Dine in Treatment: 1 Subjective History of Present Illness  (HPI) ADMISSION 07/14/2020. This is a medically complex 86 year old woman who is sent over urgently from her primary care doctor Dr. Doy Hutching at [facility] and we worked her in this afternoon. She is apparently developed a blister on the dorsal foot sometime over the last 2 weeks on the right which seems to have developed a hemorrhagic component to this. The history here is somewhat vague but this is happened since she left the hospital on 07/02/2020. She also has a blistered area on the left dorsal foot although this does not have a hemorrhagic component to it and it is not threatened to open where is the right side is definitely leaking fluid and could easily break down. I do not believe  she has been doing anything specific to this. With regards to her medical history I have reviewed the discharge summary from the hospital from 06/22/2020 through 07/02/2020. 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 She was given prednisone but was not felt to be a candidate for immunosuppression secondary to her age and comorbidities. She is ANA positive p-ANCA positive. She had a right IJ catheter placed on 9/24 and is being done on dialysis. She is a diabetic. She takes prednisone 20 mg twice daily. She was on aspirin and Plavix although I think these have been put on hold. Past medical history includes; pauci-immune crescentic glomerulonephritis. History of peripheral edema and type 2 diabetes left carotid artery stenosis with a history of a CVA, coronary artery disease and hypertension I cannot see any arterial evaluation in her record in [facility] link. We could not do arterial ABIs on her because of complaints of discomfort 10/20; 1 week follow-up. This is a patient who is a diabetic. She recently developed acute renal failure because of pauci-immune glomerulonephritis. Sometime during her initial hospitalization she  developed a wound on her right foot tremendous swelling in both legs. She was sent over here urgently last week by her primary care physician with what looks to be hematoma still contained on the right dorsal foot. We put silver alginate on this and put her in compression to control some of the swelling. The left leg had a similar appearance with severe edema in the left dorsal foot so we put that under compression as well. When she arrived back for nurse follow-up on Friday there was some drainage coming out of the wound which our nurse cultured. This is come back showing Pseudomonas Enterococcus faecalis and staph aureus although the identification of the staph aureus is not complete. I empirically put her on cefdinir 300 mg after dialysis on Tuesday Thursday and Saturday she is only had one dose. She has not been systemically unwell Objective Constitutional Sitting or standing Blood Pressure is within target range for patient.. Pulse regular and within target range for patient.Marland Kitchen Respirations regular, non- labored and within target range.. Temperature is normal and within the target range for the patient.Marland Kitchen appears in no distress. Vitals Time Taken: 1:39 PM, Height: 64 in, Weight: 205 lbs, BMI: 35.2, Temperature: 98.2 F, Pulse: 87 bpm, Respiratory Rate: 20 breaths/min, Blood Pressure: 127/71 mmHg. Cardiovascular Fortunately her pedal pulses are robust to both the dorsalis pedis and posterior tibia. Edema in both legs still marked especially into the dorsal feet although improved from last week. General Notes: Wound exam; she arrives in clinic today with a necrotic surface over the large wound on her right dorsal foot. This was liquefying. I used pickups and a #15 scalpel to remove this. Copious amounts of necrotic debris, old hematoma. Quite malodorous. I used a #5 curette to remove as much of this from the surface as possible. This is a deep wound with exposed muscle but there is still necrotic  material at the base of edema and with an extensive debridement today. There is some surrounding erythema and tenderness Integumentary (Hair, Skin) Wound #1 status is Open. Original cause of wound was Blister. The wound is located on the Right,Dorsal Foot. The wound measures 6.5cm length x 3.5cm width x 2.4cm depth; 17.868cm^2 area and 42.883cm^3 volume. There is no tunneling or undermining noted. There is a large amount of serosanguineous drainage noted. The wound margin is flat and intact.  There is no granulation within the wound bed. There is a large (67- 100%) amount of necrotic tissue within the wound bed including Adherent Slough. 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 There was a Excisional Skin/Subcutaneous Tissue/Muscle Debridement with a total area of 22.75 sq cm performed by Ricard Dillon, MD. With the following instrument(s): Blade, Curette, and Forceps to remove Viable and Non-Viable tissue/material. Material removed includes Fat, Muscle, and Subcutaneous Tissue. No specimens were taken. A time out was conducted at 13:45, prior to the start of the procedure. A Large amount of bleeding was controlled with Silver Nitrate. The procedure was tolerated well. Post Debridement Measurements: 6cm length x 4cm width x 1.2cm depth; 22.619cm^3 volume. Character of Wound/Ulcer Post Debridement is improved. Severity of Tissue Post Debridement is: Necrosis of muscle. Post procedure Diagnosis Wound #1:  Same as Pre-Procedure Plan 1. This is an extensive deep wound. The exact pathogenesis of this is not really clear whether this is an infected hematoma or not I'm not sure. She did culture 3 different organisms we still don't have the sensitivities back she is on cefdinir 300 mg after dialysis. If this is MRSA I will put a call into dialysis for IV antibiotics for perhaps 2 weeks. 2. Fortunately she does not have a blood flow issue based on clinical exam. We were not able to do ABIs in the clinic because of the degree of the edema and discomfort although I may retry that next week 3. We are still using silver alginate on the wound surface antibacterial and absorbing 4. Unfortunately she is likely to require more debridement. This borders on what we can do in the clinic. I had some thoughts that this may need to go to the OR although I'll delay that decision for next week when we have her cultures back, antibiotics sorted out 5. As mentioned her arterial status seems stable although it may check her ABIs next week in the right leg. Her edema control is much better than last time. I think this is probably an infected hematoma. Doubt this started as an abscess and she had residual blood clots when I debrided it today. Electronic Signature(s) Signed: 07/23/2020 8:18:14 AM By: Linton Ham MD Entered By: Linton Ham on 07/21/2020 14:12:32 [name] ([id number]) -------------------------------------------------------------------------------- SuperBill Details [name] Date of Service: 07/21/2020 Medical Record Number: [id number] Patient Account Number: [id number] Date of Birth/Sex: 05-05-1934 (86 y.o. F) Treating RN: Cornell Barman Primary Care Provider: Fulton Reek Other Clinician: Referring Provider: Fulton Reek Treating Provider/Extender: Tito Dine in Treatment: 1 Diagnosis Coding ICD-10 Codes Code Description S90.31XD Contusion of  right foot, subsequent encounter S90.822D Blister (nonthermal), left foot, subsequent encounter L97.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 with necrosis of muscle E11.22 Type 2 diabetes mellitus with diabetic chronic kidney disease N18.5 Chronic kidney disease, stage 5 Facility Procedures CPT4 Code: [id number] Description: 38756 - DEB MUSC/FASCIA 20 SQ CM/< Modifier: Quantity: 1 CPT4 Code: Description: ICD-10 Diagnosis Description L97.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 with necrosis of m Modifier:  uscle Quantity: CPT4 Code: 48144392 Description: 65997 - DEB MUSC/FASCIA EA ADDL 20 CM Modifier: Quantity: 1 CPT4 Code: Description: ICD-10 Diagnosis Description L97.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 with necrosis of m Modifier: uscle Quantity: Physician Procedures CPT4 Code: [id number] Description: 68852 - WC PHYS DEBR MUSCLE/FASCIA 20 SQ CM Modifier: Quantity: 1 CPT4 Code: Description: ICD-10 Diagnosis Description L97.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 with necrosis of mu Modifier: scle Quantity: CPT4 Code: 0740979 Description: 11046 - WC PHYS DEB MUSC/FASC EA ADDL 20 CM Modifier: Quantity: 1 CPT4 Code: Description: ICD-10 Diagnosis Description L97.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 with necrosis of mu Modifier: scle Quantity: Electronic Signature(s) Signed: 07/23/2020 8:18:14 AM By: Linton Ham MD Entered By: Linton Ham on 07/21/2020 14:12:52

## 2020-07-23 NOTE — Progress Notes (Signed)
[name] ([id number]) Visit Report for 07/21/2020 Arrival Information Details [name] Date of Service: 07/21/2020 1:15 PM Medical Record Number: [id number] Patient Account Number: [id number] Date of Birth/Sex: 1934-07-17 (86 y.o. F) Treating RN: Carlene Coria Primary Care Vaniya Augspurger: Fulton Reek Other Clinician: Referring Kimbly Eanes: Fulton Reek Treating Magin Balbi/Extender: Tito Dine in Treatment: 1 Visit Information History Since Last Visit All ordered tests and consults were completed: No Patient Arrived: Wheel Chair Added or deleted any medications: No Arrival Time: 13:39 Any new allergies or adverse reactions: No Accompanied By: husband Had a fall or experienced change in No Transfer Assistance: None activities of daily living that may affect Patient Identification Verified: Yes risk of falls: Secondary Verification Process Completed: Yes Signs or symptoms of abuse/neglect since last visito No Patient Requires Transmission-Based Precautions: No Hospitalized since last visit: No Patient Has Alerts: No Implantable device outside of the clinic excluding No cellular tissue based products placed in the center since last visit: Has Dressing in Place as Prescribed: Yes Has Compression in Place as Prescribed: Yes Pain Present Now: No Electronic Signature(s) Signed: 07/21/2020 4:54:21 PM By: Carlene Coria RN Entered By: Carlene Coria on 07/21/2020 13:39:45 [name] ([id number]) -------------------------------------------------------------------------------- Encounter Discharge Information Details [name] Date of Service: 07/21/2020 1:15 PM Medical Record Number: [id number] Patient Account Number: [id number] Date of Birth/Sex: 07/07/1934 (86 y.o. F) Treating RN: Cornell Barman Primary Care Keegen Heffern: Fulton Reek Other Clinician: Referring Kaisha Wachob: Fulton Reek Treating Konica Stankowski/Extender:  Tito Dine in Treatment: 1 Encounter Discharge Information Items Post Procedure Vitals Discharge Condition: Stable Temperature (F): 98.2 Ambulatory Status: Ambulatory Pulse (bpm): 87 Discharge Destination: Home Respiratory Rate (breaths/min): 20 Transportation: Private Auto Blood Pressure (mmHg): 127/71 Accompanied By: self Schedule Follow-up Appointment: Yes Clinical Summary of Care: Electronic Signature(s) Signed: 07/22/2020 5:56:15 PM By: Gretta Cool, BSN, RN, CWS, Kim RN, BSN Entered By: Gretta Cool, BSN, RN, CWS, Kim on 07/21/2020 14:18:15 [name] ([id number]) -------------------------------------------------------------------------------- Lower Extremity Assessment Details [name] Date of Service: 07/21/2020 1:15 PM Medical Record Number: [id number] Patient Account Number: [id number] Date of Birth/Sex: 11/22/1933 (86 y.o. F) Treating RN: Carlene Coria Primary Care Nahiara Kretzschmar: Fulton Reek Other Clinician: Referring Brayla Pat: Fulton Reek Treating Markell Sciascia/Extender: Tito Dine in Treatment: 1 Edema Assessment Assessed: [Left: No] [Right: No] [Left: Edema] [Right: :] Calf Left: Right: Point of Measurement: From Medial Instep 44 cm 45 cm Ankle Left: Right: Point of Measurement: From Medial Instep 30 cm 28 cm Electronic Signature(s) Signed: 07/21/2020 4:54:21 PM By: Carlene Coria RN Entered By: Carlene Coria on 07/21/2020 13:41:57 [name] ([id number]) -------------------------------------------------------------------------------- Multi Wound Chart Details [name] Date of Service: 07/21/2020 1:15 PM Medical Record Number: [id number] Patient Account Number: [id number] Date of Birth/Sex: Apr 20, 1934 (86 y.o. F) Treating RN: Cornell Barman Primary Care Eddison Searls: Fulton Reek Other Clinician: Referring Lennette Fader: Fulton Reek Treating Tarvaris Puglia/Extender: Tito Dine in  Treatment: 1 Vital Signs Height(in): 64 Pulse(bpm): 45 Weight(lbs): 205 Blood Pressure(mmHg): 127/71 Body Mass Index(BMI): 35 Temperature(F): 98.2 Respiratory Rate(breaths/min): 20 Photos: [N/A:N/A] Wound Location: Right, Dorsal Foot N/A N/A Wounding Event: Blister N/A N/A Primary Etiology: Diabetic Wound/Ulcer of the Lower N/A N/A Extremity Comorbid History: Congestive Heart Failure, N/A N/A Hypertension, Type II Diabetes, Received Radiation Date Acquired: 07/02/2020 N/A N/A Weeks of Treatment: 1 N/A N/A Wound Status: Open N/A N/A Measurements L x W x D (cm) 6.5x3.5x2.4 N/A N/A Area (cm) : 17.868 N/A N/A Volume (cm) : 32.355 N/A  N/A % Reduction in Area: 31.10% N/A N/A % Reduction in Volume: -1554.40% N/A N/A Classification: Grade 2 N/A N/A Exudate Amount: Large N/A N/A Exudate Type: Serosanguineous N/A N/A Exudate Color: red, brown N/A N/A Wound Margin: Flat and Intact N/A N/A Granulation Amount: None Present (0%) N/A N/A Necrotic Amount: Large (67-100%) N/A N/A Exposed Structures: Fascia: No N/A N/A Fat Layer (Subcutaneous Tissue): No Tendon: No Muscle: No Joint: No Bone: No Epithelialization: None N/A N/A Debridement: Debridement - Excisional N/A N/A Pre-procedure Verification/Time 13:45 N/A N/A Out Taken: Tissue Debrided: Muscle, Fat, Subcutaneous N/A N/A Level: Skin/Subcutaneous Tissue/Muscle N/A N/A Debridement Area (sq cm): 22.75 N/A N/A Instrument: Blade, Curette, Forceps N/A N/A Bleeding: Large N/A N/A Hemostasis Achieved: Silver Nitrate N/A N/A Debridement Treatment Procedure was tolerated well N/A N/A Response: Post Debridement 6x4x1.2 N/A N/A Measurements L x W x D (cm) [name] ([id number]) Post Debridement Volume: 22.619 N/A N/A (cm) Procedures Performed: Debridement N/A N/A Treatment Notes Electronic Signature(s) Signed: 07/23/2020 8:18:14 AM By: Linton Ham MD Entered By: Linton Ham on 07/21/2020 14:04:15 [name] ([id number]) -------------------------------------------------------------------------------- Multi-Disciplinary Care Plan Details [name] Date of Service: 07/21/2020 1:15 PM Medical Record Number: [id number] Patient Account Number: [id number] Date of Birth/Sex: [birth year] (86 y.o. F) Treating RN: Cornell Barman Primary Care Lanyah Spengler: Fulton Reek Other Clinician: Referring Casady Voshell: Fulton Reek Treating Lusero Nordlund/Extender: Tito Dine in Treatment: 1 Active Inactive Necrotic Tissue Nursing Diagnoses: Impaired tissue integrity related to necrotic/devitalized tissue Goals: Necrotic/devitalized tissue will be minimized in the wound bed Date Initiated: 07/14/2020 Target Resolution Date: 08/14/2020 Goal Status: Active Interventions: Assess patient pain level pre-, during and post procedure and prior to discharge Treatment Activities: Apply topical anesthetic as ordered : 07/14/2020 Notes: Orientation to the Wound Care Program Nursing Diagnoses: Knowledge deficit related to the wound healing center program Goals: Patient/caregiver will verbalize understanding of the [place] Date Initiated: 07/14/2020 Target Resolution Date: 08/13/2020 Goal Status: Active Interventions: Provide education on orientation to the wound center Notes: Wound/Skin Impairment Nursing Diagnoses: Impaired tissue integrity Goals: Patient/caregiver will verbalize understanding of skin care regimen Date Initiated: 07/14/2020 Target Resolution Date: 07/14/2020 Goal Status: Active Interventions: Assess ulceration(s) every visit Treatment Activities: Topical wound management initiated : 07/14/2020 Notes: Electronic Signature(s) Signed: 07/22/2020 5:56:15 PM By: Gretta Cool, BSN, RN, CWS, Kim RN, BSN Entered By: Gretta Cool, BSN, RN, CWS, Kim on 07/21/2020 13:49:46 [name] ([id number] Labrum, Kathi Der  ([id number]) -------------------------------------------------------------------------------- Pain Assessment Details [name] Date of Service: 07/21/2020 1:15 PM Medical Record Number: [id number] Patient Account Number: [id number] Date of Birth/Sex: 31-Jul-1934 (86 y.o. F) Treating RN: Carlene Coria Primary Care Analeigh Aries: Fulton Reek Other Clinician: Referring Karlisha Mathena: Fulton Reek Treating Ciana Simmon/Extender: Tito Dine in Treatment: 1 Active Problems Location of Pain Severity and Description of Pain Patient Has Paino No Site Locations Pain Management and Medication Current Pain Management: Electronic Signature(s) Signed: 07/21/2020 4:54:21 PM By: Carlene Coria RN Entered By: Carlene Coria on 07/21/2020 13:40:25 [name] ([id number]) -------------------------------------------------------------------------------- Patient/Caregiver Education Details [name] Date of Service: 07/21/2020 1:15 PM Medical Record Number: [id number] Patient Account Number: [id number] Date of Birth/Gender: 1934/04/09 (86 y.o. F) Treating RN: Cornell Barman Primary Care Physician: Fulton Reek Other Clinician: Referring Physician: Fulton Reek Treating Physician/Extender: Tito Dine in Treatment: 1 Education Assessment Education Provided To: Patient Education Topics Provided Welcome To The [place]: Handouts: Welcome To [facility] [place] Methods: Demonstration, Explain/Verbal Responses: State  content correctly Wound Debridement: Handouts: Wound Debridement Methods: Demonstration, Explain/Verbal Responses: State content correctly Wound/Skin Impairment: Handouts: Caring for Your Ulcer Methods: Demonstration, Explain/Verbal Responses: State content correctly Electronic Signature(s) Signed: 07/22/2020 5:56:15 PM By: Gretta Cool, BSN, RN, CWS, Kim RN, BSN Entered By: Gretta Cool, BSN, RN, CWS, Kim on  07/21/2020 14:17:11 [name] ([id number]) -------------------------------------------------------------------------------- Wound Assessment Details [name] Date of Service: 07/21/2020 1:15 PM Medical Record Number: [id number] Patient Account Number: [id number] Date of Birth/Sex: 1934/01/10 (86 y.o. F) Treating RN: Carlene Coria Primary Care Crissie Aloi: Fulton Reek Other Clinician: Referring Jaymes Hang: Fulton Reek Treating Camryn Lampson/Extender: Tito Dine in Treatment: 1 Wound Status Wound Number: 1 Primary Diabetic Wound/Ulcer of the Lower Extremity Etiology: Wound Location: Right, Dorsal Foot Wound Status: Open Wounding Event: Blister Comorbid Congestive Heart Failure, Hypertension, Type II Date Acquired: 07/02/2020 History: Diabetes, Received Radiation Weeks Of Treatment: 1 Clustered Wound: No Photos Wound Measurements Length: (cm) 6.5 % Width: (cm) 3.5 % Depth: (cm) 2.4 E Area: (cm) 17.868 Volume: (cm) 42.883 Reduction in Area: 31.1% Reduction in Volume: -1554.4% pithelialization: None Tunneling: No Undermining: No Wound Description Classification: Grade 2 Wound Margin: Flat and Intact Exudate Amount: Large Exudate Type: Serosanguineous Exudate Color: red, brown Foul Odor After Cleansing: No Slough/Fibrino Yes Wound Bed Granulation Amount: None Present (0%) Exposed Structure Necrotic Amount: Large (67-100%) Fascia Exposed: No Necrotic Quality: Adherent Slough Fat Layer (Subcutaneous Tissue) Exposed: No Tendon Exposed: No Muscle Exposed: No Joint Exposed: No Bone Exposed: No Treatment Notes Wound #1 (Right, Dorsal Foot) Notes SIlver Alginate, Xtrasorb, 3 layer bilateral for swelling Electronic Signature(s) Signed: 07/21/2020 4:54:21 PM By: Carlene Coria RN [name] ([id number]) Signed: 07/22/2020 5:56:15 PM By: Gretta Cool, BSN, RN, CWS, Kim RN, BSN Entered By: Gretta Cool, BSN, RN, CWS, Kim on 07/21/2020  14:19:53 [name] ([id number]) -------------------------------------------------------------------------------- Vitals Details [name] Date of Service: 07/21/2020 1:15 PM Medical Record Number: [id number] Patient Account Number: [id number] Date of Birth/Sex: Feb 03, 1934 (86 y.o. F) Treating RN: Carlene Coria Primary Care Monisha Siebel: Fulton Reek Other Clinician: Referring Max Nuno: Fulton Reek Treating Carnie Bruemmer/Extender: Tito Dine in Treatment: 1 Vital Signs Time Taken: 13:39 Temperature (F): 98.2 Height (in): 64 Pulse (bpm): 87 Weight (lbs): 205 Respiratory Rate (breaths/min): 20 Body Mass Index (BMI): 35.2 Blood Pressure (mmHg): 127/71 Reference Range: 80 - 120 mg / dl Electronic Signature(s) Signed: 07/21/2020 4:54:21 PM By: Carlene Coria RN Entered By: Carlene Coria on 07/21/2020 13:40:13

## 2020-07-26 ENCOUNTER — Other Ambulatory Visit: Payer: Self-pay

## 2020-07-26 DIAGNOSIS — E11621 Type 2 diabetes mellitus with foot ulcer: Secondary | ICD-10-CM | POA: Diagnosis not present

## 2020-07-27 NOTE — Progress Notes (Signed)
[name], [name] ([id number]) Visit Report for 07/26/2020 Arrival Information Details [name], [name] Date of Service: 07/26/2020 10:45 AM Medical Record Number: [id number] Patient Account Number: [id number] Date of Birth/Sex: 1934-07-17 (85 y.o. F) Treating RN: Cornell Barman Primary Care Taraneh Metheney: Fulton Reek Other Clinician: Referring Humberto Addo: Fulton Reek Treating Vivianne Carles/Extender: Skipper Cliche in Treatment: 1 Visit Information History Since Last Visit Has Dressing in Place as Prescribed: Yes Patient Arrived: Wheel Chair Has Compression in Place as Prescribed: Yes Arrival Time: 11:00 Pain Present Now: No Accompanied By: husband Transfer Assistance: None Patient Identification Verified: Yes Patient Requires Transmission-Based Precautions: No Patient Has Alerts: No Electronic Signature(s) Signed: 07/26/2020 6:06:12 PM By: Gretta Cool, BSN, RN, CWS, Kim RN, BSN Entered By: Gretta Cool, BSN, RN, CWS, Kim on 07/26/2020 11:01:14 [name] ([id number]) -------------------------------------------------------------------------------- Compression Therapy Details [name] Date of Service: 07/26/2020 10:45 AM Medical Record Number: [id number] Patient Account Number: [id number] Date of Birth/Sex: 1934-08-11 (85 y.o. F) Treating RN: Cornell Barman Primary Care Shironda Kain: Fulton Reek Other Clinician: Referring Amyra Vantuyl: Fulton Reek Treating Tyree Vandruff/Extender: Skipper Cliche in Treatment: 1 Compression Therapy Performed for Wound Assessment: Wound #1 Right,Dorsal Foot Performed By: Clinician Cornell Barman, RN Compression Type: Three Layer Pre Treatment ABI: 1 Electronic Signature(s) Signed: 07/26/2020 6:06:12 PM By: Gretta Cool, BSN, RN, CWS, Kim RN, BSN Entered By: Gretta Cool, BSN, RN, CWS, Kim on 07/26/2020 11:46:03 [name] ([id number]) -------------------------------------------------------------------------------- Encounter  Discharge Information Details [name] Date of Service: 07/26/2020 10:45 AM Medical Record Number: [id number] Patient Account Number: [id number] Date of Birth/Sex: 1933/11/16 (85 y.o. F) Treating RN: Cornell Barman Primary Care Shemeka Wardle: Fulton Reek Other Clinician: Referring Laurance Heide: Fulton Reek Treating Morena Mckissack/Extender: Skipper Cliche in Treatment: 1 Encounter Discharge Information Items Discharge Condition: Stable Ambulatory Status: Ambulatory Discharge Destination: Home Transportation: Private Auto Accompanied By: husband Schedule Follow-up Appointment: Yes Clinical Summary of Care: Electronic Signature(s) Signed: 07/26/2020 6:06:12 PM By: Gretta Cool, BSN, RN, CWS, Kim RN, BSN Entered By: Gretta Cool, BSN, RN, CWS, Kim on 07/26/2020 11:46:37 [name] ([id number]) -------------------------------------------------------------------------------- Wound Assessment Details [name] Date of Service: 07/26/2020 10:45 AM Medical Record Number: [id number] Patient Account Number: [id number] Date of Birth/Sex: 09-17-1934 (85 y.o. F) Treating RN: Cornell Barman Primary Care Larue Drawdy: Fulton Reek Other Clinician: Referring Annaliz Aven: Fulton Reek Treating Yaniah Thiemann/Extender: Skipper Cliche in Treatment: 1 Wound Status Wound Number: 1 Primary Diabetic Wound/Ulcer of the Lower Extremity Etiology: Wound Location: Right, Dorsal Foot Wound Status: Open Wounding Event: Blister Comorbid Congestive Heart Failure, Hypertension, Type II Date Acquired: 07/02/2020 History: Diabetes, Received Radiation Weeks Of Treatment: 1 Clustered Wound: No Photos Wound Measurements Length: (cm) 6 % Width: (cm) 3.8 % Depth: (cm) 1.2 Ep Area: (cm) 17.907 T Volume: (cm) 21.488 Un Reduction in Area: 30.9% Reduction in Volume: -729% ithelialization: None unneling: Yes dermining: Yes Wound Description Classification: Grade 2 F Wound Margin:  Flat and Intact S Exudate Amount: Large Exudate Type: Purulent Exudate Color: yellow, brown, green oul Odor After Cleansing: No lough/Fibrino Yes Wound Bed Granulation Amount: None Present (0%) Exposed Structure Necrotic Amount: Large (67-100%) Fascia Exposed: No Necrotic Quality: Adherent Slough Fat Layer (Subcutaneous Tissue) Exposed: Yes Tendon Exposed: No Muscle Exposed: No Joint Exposed: No Bone Exposed: No Treatment Notes Wound #1 (Right, Dorsal Foot) Notes SIlver Alginate, Xtrasorb, 3 layer bilateral for swelling [name], [name] ([id number]) Electronic Signature(s) Signed: 07/26/2020 6:06:12 PM By: Gretta Cool, BSN, RN, CWS, Kim RN, BSN Entered By: Gretta Cool, BSN, RN, CWS, Kim on 07/26/2020  11:04:49 

## 2020-07-28 ENCOUNTER — Encounter: Payer: Medicare Other | Admitting: Internal Medicine

## 2020-07-28 ENCOUNTER — Other Ambulatory Visit: Payer: Self-pay

## 2020-07-28 DIAGNOSIS — E11621 Type 2 diabetes mellitus with foot ulcer: Secondary | ICD-10-CM | POA: Diagnosis not present

## 2020-07-29 NOTE — Progress Notes (Signed)
MAIKO, SALAIS ([id number]) Visit Report for 07/28/2020 Debridement Details [name], [name] Date of Service: 07/28/2020 8:15 AM Medical Record Number: [id number] Patient Account Number: [id number] Date of Birth/Sex: 1934-10-02 (86 y.o. F) Treating RN: Cornell Barman Primary Care Provider: Fulton Reek Other Clinician: Referring Provider: Fulton Reek Treating Provider/Extender: Tito Dine in Treatment: 2 Debridement Performed for Wound #1 Right,Dorsal Foot Assessment: Performed By: Physician Ricard Dillon, MD Debridement Type: Debridement Severity of Tissue Pre Debridement: Fat layer exposed Level of Consciousness (Pre- Awake and Alert procedure): Pre-procedure Verification/Time Out Yes - 08:51 Taken: Total Area Debrided (L x W): 5.5 (cm) x 3.7 (cm) = 20.35 (cm) Tissue and other material Viable, Non-Viable, Eschar, Slough, Subcutaneous, Slough debrided: Level: Skin/Subcutaneous Tissue Debridement Description: Excisional Instrument: Curette Bleeding: Moderate Hemostasis Achieved: Pressure Response to Treatment: Procedure was tolerated well Level of Consciousness (Post- Awake and Alert procedure): Post Debridement Measurements of Total Wound Length: (cm) 5.5 Width: (cm) 3.7 Depth: (cm) 1.5 Volume: (cm) 23.974 Character of Wound/Ulcer Post Debridement: Stable Severity of Tissue Post Debridement: Muscle involvement without necrosis Post Procedure Diagnosis Same as Pre-procedure Electronic Signature(s) Signed: 07/28/2020 5:48:54 PM By: Gretta Cool, BSN, RN, CWS, Kim RN, BSN Signed: 07/29/2020 7:28:08 AM By: Linton Ham MD Entered By: Linton Ham on 07/28/2020 09:03:27 [name] ([id number]) -------------------------------------------------------------------------------- HPI Details [name] Date of Service: 07/28/2020 8:15 AM Medical Record Number: [id number] Patient Account Number:  [id number] Date of Birth/Sex: Feb 28, 1934 (86 y.o. F) Treating RN: Cornell Barman Primary Care Provider: Fulton Reek Other Clinician: Referring Provider: Fulton Reek Treating Provider/Extender: Tito Dine in Treatment: 2 History of Present Illness HPI Description: ADMISSION 07/14/2020. This is a medically complex 86 year old woman who is sent over urgently from her primary care doctor Dr. Doy Hutching at [facility] and we worked her in this afternoon. She is apparently developed a blister on the dorsal foot sometime over the last 2 weeks on the right which seems to have developed a hemorrhagic component to this. The history here is somewhat vague but this is happened since she left the hospital on 07/02/2020. She also has a blistered area on the left dorsal foot although this does not have a hemorrhagic component to it and it is not threatened to open where is the right side is definitely leaking fluid and could easily break down. I do not believe she has been doing anything specific to this. With regards to her medical history I have reviewed the discharge summary from the hospital from 06/22/2020 through 07/02/2020. 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 She was given prednisone but was not felt to be a candidate for immunosuppression secondary to her age and comorbidities. She is ANA positive p-ANCA positive. She had a right IJ catheter placed on 9/24 and is being done on dialysis. She is a diabetic. She takes prednisone 20 mg twice daily. She was on aspirin and Plavix although I think these have been put on hold. Past medical history includes; pauci-immune crescentic glomerulonephritis. History of peripheral edema and type 2 diabetes left carotid artery stenosis with a history of a CVA, coronary artery disease and hypertension I cannot see any arterial evaluation in her record in [facility]. We could not do arterial ABIs on her because of complaints of discomfort 10/20; 1 week follow-up. This is a patient who is a diabetic. She recently developed acute renal failure because of pauci-immune glomerulonephritis. Sometime  during her initial hospitalization she developed a wound on her right foot tremendous swelling in both legs. She was sent over here urgently last week by her primary care physician with what looks to be hematoma still contained on the right dorsal foot. We put silver alginate on this and put her in compression to control some of the swelling. The left leg had a similar appearance with severe edema in the left dorsal foot so we put that under compression as well. When she arrived back for nurse follow-up on Friday there was some drainage coming out of the wound which our nurse cultured. This is come back showing Pseudomonas Enterococcus faecalis and staph aureus although the identification of the staph aureus is not complete. I empirically put her on cefdinir 300 mg after dialysis on Tuesday Thursday and Saturday she is only had one dose. She has not been systemically unwell 10/27 1 week follow-up. With considerable help of her nursing staff I was finally able to speak to her nephrologist who manages her at dialysis. Started her on vancomycin and ceftazidime. This to cover the combination of methicillin sensitive staph aureus Enterococcus faecalis and Pseudomonas. Although the staph aureus was methicillin sensitive and the Enterococcus ampicillin sensitive vancomycin provided the best alternative here that can be given at dialysis and ceftazidime to cover the Pseudomonas. She had a traumatic wound on the top of her foot and a complicated abscess. She has significant tissue destruction from this although the wound is looking a lot better today. We are using silver alginate to the surface. X-ray of the foot did not show any deep obvious infection or  osteomyelitis Electronic Signature(s) Signed: 07/29/2020 7:28:08 AM By: Linton Ham MD Entered By: Linton Ham on 07/28/2020 09:06:19 [name] ([id number]) -------------------------------------------------------------------------------- Physical Exam Details [name] Date of Service: 07/28/2020 8:15 AM Medical Record Number: [id number] Patient Account Number: [id number] Date of Birth/Sex: 20-Sep-1934 (86 y.o. F) Treating RN: Cornell Barman Primary Care Provider: Fulton Reek Other Clinician: Referring Provider: Fulton Reek Treating Provider/Extender: Tito Dine in Treatment: 2 Constitutional Sitting or standing Blood Pressure is within target range for patient.. Pulse regular and within target range for patient.Marland Kitchen Respirations regular, non- labored and within target range.. Temperature is normal and within the target range for the patient.Marland Kitchen appears in no distress. Notes Wound exam; this is on the dorsal right foot. The swelling and the erythema in the subcutaneous tissue around the wound is a lot better this week. Still requiring debridement with a #5 curette this involves necrotic fat probably some muscle. She does not have exposed tendon but cannot be far off this. Hemostasis with silver nitrate and direct pressure. Electronic Signature(s) Signed: 07/29/2020 7:28:08 AM By: Linton Ham MD Entered By: Linton Ham on 07/28/2020 09:07:17 [name] ([id number]) -------------------------------------------------------------------------------- Physician Orders Details [name] Date of Service: 07/28/2020 8:15 AM Medical Record Number: [id number] Patient Account Number: [id number] Date of Birth/Sex: 1934-06-25 (86 y.o. F) Treating RN: Cornell Barman Primary Care Provider: Fulton Reek Other Clinician: Referring Provider: Fulton Reek Treating Provider/Extender: Tito Dine in  Treatment: 2 Verbal / Phone Orders: No Diagnosis Coding Wound Cleansing Wound #1 Right,Dorsal Foot o Clean wound with Normal Saline. Anesthetic (add to Medication List) Wound #1 Right,Dorsal Foot o Topical Lidocaine 4% cream applied to wound bed prior to debridement (In Clinic Only). Primary Wound Dressing Wound #1 Right,Dorsal Foot o Silver Alginate Secondary Dressing Wound #1 Right,Dorsal Foot o XtraSorb Dressing Change Frequency  Wound #1 Right,Dorsal Foot o Change Dressing Monday, Wednesday, Friday Follow-up Appointments Wound #1 Right,Dorsal Foot o Return Appointment in 1 week. Edema Control Wound #1 Right,Dorsal Foot o 3 Layer Compression System - Bilateral Additional Orders / Instructions Wound #1 Right,Dorsal Foot o Increase protein intake. Home Health Wound #1 [place] Visits - [place] Nurse may visit PRN to address patientos wound care needs. o FACE TO FACE ENCOUNTER: MEDICARE and MEDICAID PATIENTS: I certify that this patient is under my care and that I had a face-to-face encounter that meets the physician face-to-face encounter requirements with this patient on this date. The encounter with the patient was in whole or in part for the following MEDICAL CONDITION: (primary reason for [place]) MEDICAL NECESSITY: I certify, that based on my findings, NURSING services are a medically necessary home health service. HOME BOUND STATUS: I certify that my clinical findings support that this patient is homebound (i.e., Due to illness or injury, pt requires aid of supportive devices such as crutches, cane, wheelchairs, walkers, the use of special transportation or the assistance of another person to leave their place of residence. There is a normal inability to leave the home and doing so requires considerable and taxing effort. Other absences are for medical reasons / religious services and are  infrequent or of short duration when for other reasons). o If current dressing causes regression in wound condition, may D/C ordered dressing product/s and apply Normal Saline Moist Dressing daily until next [place] / Other MD appointment. [place] of regression in wound condition at [phone]. o Please direct any NON-WOUND related issues/requests for orders to patient's Primary Care Physician REVECCA, NACHTIGAL ([id number]) Electronic Signature(s) Signed: 07/28/2020 5:48:54 PM By: Gretta Cool, BSN, RN, CWS, Kim RN, BSN Signed: 07/29/2020 7:28:08 AM By: Linton Ham MD Entered By: Gretta Cool, BSN, RN, CWS, Kim on 07/28/2020 08:52:34 [name] ([id number]) -------------------------------------------------------------------------------- Problem List Details [name] Date of Service: 07/28/2020 8:15 AM Medical Record Number: [id number] Patient Account Number: [id number] Date of Birth/Sex: Jul 28, 1934 (86 y.o. 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/21/2020 No Yes muscle E11.22 Type 2 diabetes mellitus with diabetic chronic kidney disease 07/14/2020 No Yes N18.5 Chronic kidney disease, stage 5 07/14/2020 No Yes Inactive Problems Resolved Problems Electronic Signature(s) Signed: 07/29/2020 7:28:08 AM By: Linton Ham MD Entered By: Linton Ham on 07/28/2020 09:02:25 [name]  ([id number]) -------------------------------------------------------------------------------- Progress Note Details [name] Date of Service: 07/28/2020 8:15 AM Medical Record Number: [id number] Patient Account Number: [id number] Date of Birth/Sex: 20-Jul-1934 (86 y.o. F) Treating RN: Cornell Barman Primary Care Provider: Fulton Reek Other Clinician: Referring Provider: Fulton Reek Treating Provider/Extender: Tito Dine in Treatment: 2 Subjective History of Present Illness (HPI) ADMISSION 07/14/2020. This is a medically complex 86 year old woman who is sent over urgently from her primary care doctor Dr. Doy Hutching at [facility] and we worked her in this afternoon. She is apparently developed a blister on the dorsal foot sometime over the last 2 weeks on the right which seems to have developed a hemorrhagic component  to this. The history here is somewhat vague but this is happened since she left the hospital on 07/02/2020. She also has a blistered area on the left dorsal foot although this does not have a hemorrhagic component to it and it is not threatened to open where is the right side is definitely leaking fluid and could easily break down. I do not believe she has been doing anything specific to this. With regards to her medical history I have reviewed the discharge summary from the hospital from 06/22/2020 through 07/02/2020. 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 She was given prednisone but was not felt to be a candidate for immunosuppression secondary to her age and comorbidities. She is ANA positive p-ANCA positive. She had a right IJ catheter placed on 9/24 and is being done on dialysis. She is a diabetic. She takes prednisone 20 mg twice daily. She was on aspirin and Plavix although I think these have been put on hold. Past medical history includes;  pauci-immune crescentic glomerulonephritis. History of peripheral edema and type 2 diabetes left carotid artery stenosis with a history of a CVA, coronary artery disease and hypertension I cannot see any arterial evaluation in her record in [place] link. We could not do arterial ABIs on her because of complaints of discomfort 10/20; 1 week follow-up. This is a patient who is a diabetic. She recently developed acute renal failure because of pauci-immune glomerulonephritis. Sometime during her initial hospitalization she developed a wound on her right foot tremendous swelling in both legs. She was sent over here urgently last week by her primary care physician with what looks to be hematoma still contained on the right dorsal foot. We put silver alginate on this and put her in compression to control some of the swelling. The left leg had a similar appearance with severe edema in the left dorsal foot so we put that under compression as well. When she arrived back for nurse follow-up on Friday there was some drainage coming out of the wound which our nurse cultured. This is come back showing Pseudomonas Enterococcus faecalis and staph aureus although the identification of the staph aureus is not complete. I empirically put her on cefdinir 300 mg after dialysis on Tuesday Thursday and Saturday she is only had one dose. She has not been systemically unwell 10/27 1 week follow-up. With considerable help of her nursing staff I was finally able to speak to her nephrologist who manages her at dialysis. Started her on vancomycin and ceftazidime. This to cover the combination of methicillin sensitive staph aureus Enterococcus faecalis and Pseudomonas. Although the staph aureus was methicillin sensitive and the Enterococcus ampicillin sensitive vancomycin provided the best alternative here that can be given at dialysis and ceftazidime to cover the Pseudomonas. She had a traumatic wound on the top of her foot  and a complicated abscess. She has significant tissue destruction from this although the wound is looking a lot better today. We are using silver alginate to the surface. X-ray of the foot did not show any deep obvious infection or osteomyelitis Objective Constitutional Sitting or standing Blood Pressure is within target range for patient.. Pulse regular and within target range for patient.Marland Kitchen Respirations regular, non- labored and within target range.. Temperature is normal and within the target range for the patient.Marland Kitchen appears in no distress. Vitals Time Taken: 8:20 AM, Height: 64 in, Weight: 205 lbs, BMI: 35.2, Temperature: 98.1 F, Pulse: 91 bpm,  Respiratory Rate: 18 breaths/min, Blood Pressure: 135/78 mmHg. General Notes: Wound exam; this is on the dorsal right foot. The swelling and the erythema in the subcutaneous tissue around the wound is a lot better this week. Still requiring debridement with a #5 curette this involves necrotic fat probably some muscle. She does not have exposed tendon but cannot be far off this. Hemostasis with silver nitrate and direct pressure. Integumentary (Hair, Skin) Wound #1 status is Open. Original cause of wound was Blister. The wound is located on the Right,Dorsal Foot. The wound measures 5.5cm length x 3.7cm width x 1.2cm depth; 15.983cm^2 area and 19.179cm^3 volume. There is Fat Layer (Subcutaneous Tissue) exposed. There is a large amount of purulent drainage noted. The wound margin is flat and intact. There is no granulation within the wound bed. There is a large (67- 100%) amount of necrotic tissue within the wound bed including Adherent Slough. BILLIE, TRAGER ([id number]) Assessment Active Problems ICD-10 Contusion of right foot, subsequent encounter Blister (nonthermal), left foot, subsequent encounter Non-pressure chronic ulcer of other part of right foot with necrosis of muscle Type 2 diabetes mellitus with diabetic chronic kidney  disease Chronic kidney disease, stage 5 Procedures Wound #1 Pre-procedure diagnosis of Wound #1 is a Diabetic Wound/Ulcer of the Lower Extremity located on the Right,Dorsal Foot .Severity of Tissue Pre Debridement is: Fat layer exposed. There was a Excisional Skin/Subcutaneous Tissue Debridement with a total area of 20.35 sq cm performed by Ricard Dillon, MD. With the following instrument(s): Curette to remove Viable and Non-Viable tissue/material. Material removed includes Eschar, Subcutaneous Tissue, and Slough. No specimens were taken. A time out was conducted at 08:51, prior to the start of the procedure. A Moderate amount of bleeding was controlled with Pressure. The procedure was tolerated well. Post Debridement Measurements: 5.5cm length x 3.7cm width x 1.5cm depth; 23.974cm^3 volume. Character of Wound/Ulcer Post Debridement is stable. Severity of Tissue Post Debridement is: Muscle involvement without necrosis. Post procedure Diagnosis Wound #1: Same as Pre-Procedure Plan Wound Cleansing: Wound #1 Right,Dorsal Foot: Clean wound with Normal Saline. Anesthetic (add to Medication List): Wound #1 Right,Dorsal Foot: Topical Lidocaine 4% cream applied to wound bed prior to debridement (In Clinic Only). Primary Wound Dressing: Wound #1 Right,Dorsal Foot: Silver Alginate Secondary Dressing: Wound #1 Right,Dorsal Foot: XtraSorb Dressing Change Frequency: Wound #1 Right,Dorsal Foot: Change Dressing Monday, Wednesday, Friday Follow-up Appointments: Wound #1 Right,Dorsal Foot: Return Appointment in 1 week. Edema Control: Wound #1 Right,Dorsal Foot: 3 Layer Compression System - Bilateral Additional Orders / Instructions: Wound #1 Right,Dorsal Foot: Increase protein intake. Home Health: Wound #1 Right,Dorsal Foot: [place] Visits - [place] Nurse may visit PRN to address patient s wound care needs. FACE TO FACE ENCOUNTER: MEDICARE and MEDICAID PATIENTS:  I certify that this patient is under my care and that I had a face-to-face encounter that meets the physician face-to-face encounter requirements with this patient on this date. The encounter with the patient was in whole or in part for the following MEDICAL CONDITION: (primary reason for [place]) MEDICAL NECESSITY: I certify, that based on my findings, NURSING services are a medically necessary home health service. HOME BOUND STATUS: I certify that my clinical findings support that this patient is homebound (i.e., Due to illness or injury, pt requires aid of supportive devices such as crutches, cane, wheelchairs, walkers, the use of special transportation or the assistance of another person to leave their place of residence. There is a normal inability to leave  the home and doing so requires considerable and taxing effort. Other absences are for medical reasons / religious services and are infrequent or of short duration when for other reasons). If current dressing causes regression in wound condition, may D/C ordered dressing product/s and apply Normal Saline Moist Dressing daily until KIMAYA, WHITLATCH ([id number]) next [place] / Other MD appointment. [place] of regression in wound condition at [phone]. Please direct any NON-WOUND related issues/requests for orders to patient's Primary Care Physician 1. Extensive debridement today. 2. She is on IV vancomycin and ceftazidime at dialysis for 6 dialysis sessions/2 weeks. The wound already looks better 3. Continuing with silver alginate because of the fluid drainage and the antimicrobial effect. Looking forward to perhaps changing to Iodoflex to aid with ongoing debridement. 4. Continue compression on the right leg to help with the swelling., Continue compression on the left leg to avoid skin breakdown in the left foot 5. foot looks considerably better Addendum; 07/28/2020 hemostasis with this patient  actually took quite a bit. We used prolonged pressure, silver nitrate as well as a surgical cauterizer. This eventually stopped. Notable that with her legs dependent she even had visible increase in swelling in the dorsal foot. This does not bode well going forward unless we can control this edema. I think a lot of the "bleeding" was actually partially weeping edema fluid Electronic Signature(s) Signed: 07/29/2020 7:28:08 AM By: Linton Ham MD Entered By: Linton Ham on 07/28/2020 12:31:37 [name] ([id number]) -------------------------------------------------------------------------------- SuperBill Details [name] Date of Service: 07/28/2020 Medical Record Number: [id number] Patient Account Number: [id number] Date of Birth/Sex: 1933/10/06 (86 y.o. F) Treating RN: Cornell Barman Primary Care Provider: Fulton Reek Other Clinician: Referring Provider: Fulton Reek Treating Provider/Extender: Tito Dine in Treatment: 2 Diagnosis Coding ICD-10 Codes Code Description S90.31XD Contusion of right foot, subsequent encounter S90.822D Blister (nonthermal), left foot, subsequent encounter L97.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 with necrosis of muscle E11.22 Type 2 diabetes mellitus with diabetic chronic kidney disease N18.5 Chronic kidney disease, stage 5 Facility Procedures CPT4 Code: [id number] Description: 53614 - DEB SUBQ TISSUE 20 SQ CM/< Modifier: Quantity: 1 CPT4 Code: Description: ICD-10 Diagnosis Description L97.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 with necrosis of Modifier: muscle Quantity: CPT4 Code: 43154008 Description: 67619 - DEB SUBQ TISS EA ADDL 20CM Modifier: Quantity: 1 CPT4 Code: Description: ICD-10 Diagnosis Description L97.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 with necrosis of Modifier: muscle Quantity: Physician Procedures CPT4 Code:  5093267 Description: 11042 - WC PHYS SUBQ TISS 20 SQ CM Modifier: Quantity: 1 CPT4 Code: Description: ICD-10 Diagnosis Description L97.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 with necrosis of m Modifier: uscle Quantity: CPT4 Code: 1245809 Description: 11045 - WC PHYS SUBQ TISS EA ADDL 20 CM Modifier: Quantity: 1 CPT4 Code: Description: ICD-10 Diagnosis Description L97.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 with necrosis of m Modifier: uscle Quantity: Electronic Signature(s) Signed: 07/29/2020 7:28:08 AM By: Linton Ham MD Entered By: Linton Ham on 07/28/2020 09:10:36

## 2020-07-29 NOTE — Progress Notes (Signed)
BUENA, BOEHM ([id number]) Visit Report for 07/28/2020 Arrival Information Details [name], [name] Date of Service: 07/28/2020 8:15 AM Medical Record Number: [id number] Patient Account Number: [id number] Date of Birth/Sex: 1934-01-17 (86 y.o. F) Treating RN: Cornell Barman Primary Care Susan Arana: Fulton Reek Other Clinician: Referring Nalani Andreen: Fulton Reek Treating Kendel Pesnell/Extender: Tito Dine in Treatment: 2 Visit Information History Since Last Visit Added or deleted any medications: No Patient Arrived: Wheel Chair Any new allergies or adverse reactions: No Arrival Time: 08:11 Had a fall or experienced change in No Accompanied By: husband activities of daily living that may affect Transfer Assistance: None risk of falls: Patient Identification Verified: Yes Signs or symptoms of abuse/neglect since last visito No Secondary Verification Process Completed: Yes Hospitalized since last visit: No Patient Requires Transmission-Based Precautions: No Implantable device outside of the clinic excluding No Patient Has Alerts: No cellular tissue based products placed in the center since last visit: Has Dressing in Place as Prescribed: Yes Has Compression in Place as Prescribed: Yes Pain Present Now: Yes Electronic Signature(s) Signed: 07/28/2020 3:14:44 PM By: Lorine Bears RCP, RRT, CHT Entered By: Lorine Bears on 07/28/2020 08:22:35 [name] ([id number]) -------------------------------------------------------------------------------- Encounter Discharge Information Details [name] Date of Service: 07/28/2020 8:15 AM Medical Record Number: [id number] Patient Account Number: [id number] Date of Birth/Sex: March 12, 1934 (86 y.o. F) Treating RN: Cornell Barman Primary Care Bonnetta Allbee: Fulton Reek Other Clinician: Referring Kardell Virgil: Fulton Reek Treating Gianlucca Szymborski/Extender: Tito Dine in Treatment: 2 Encounter Discharge Information Items Post Procedure Vitals Discharge Condition: Stable Temperature (F): 98.1 Ambulatory Status: Ambulatory Pulse (bpm): 91 Discharge Destination: Home Respiratory Rate (breaths/min): 18 Transportation: Private Auto Blood Pressure (mmHg): 135/78 Accompanied By: husband Schedule Follow-up Appointment: Yes Clinical Summary of Care: Electronic Signature(s) Signed: 07/28/2020 5:48:54 PM By: Gretta Cool, BSN, RN, CWS, Kim RN, BSN Entered By: Gretta Cool, BSN, RN, CWS, Kim on 07/28/2020 08:57:32 [name] ([id number]) -------------------------------------------------------------------------------- Lower Extremity Assessment Details [name] Date of Service: 07/28/2020 8:15 AM Medical Record Number: [id number] Patient Account Number: [id number] Date of Birth/Sex: Apr 29, 1934 (86 y.o. F) Treating RN: Cornell Barman Primary Care Jodene Polyak: Fulton Reek Other Clinician: Referring Tamera Pingley: Fulton Reek Treating Zakhia Seres/Extender: Tito Dine in Treatment: 2 Edema Assessment Assessed: [Left: No] [Right: No] [Left: Edema] [Right: :] Calf Left: Right: Point of Measurement: 31 cm From Medial Instep 39.5 cm 42.5 cm Ankle Left: Right: Point of Measurement: 11 cm From Medial Instep 25.8 cm 29 cm Electronic Signature(s) Signed: 07/28/2020 5:48:54 PM By: Gretta Cool, BSN, RN, CWS, Kim RN, BSN Entered By: Gretta Cool, BSN, RN, CWS, Kim on 07/28/2020 08:39:08 [name] ([id number]) -------------------------------------------------------------------------------- Multi Wound Chart Details [name] Date of Service: 07/28/2020 8:15 AM Medical Record Number: [id number] Patient Account Number: [id number] Date of Birth/Sex: [birth year] (86 y.o. F) Treating RN: Cornell Barman Primary Care Timouthy Gilardi: Fulton Reek Other Clinician: Referring Jacquan Savas: Fulton Reek Treating  Jalyiah Shelley/Extender: Tito Dine in Treatment: 2 Vital Signs Height(in): 64 Pulse(bpm): 44 Weight(lbs): 205 Blood Pressure(mmHg): 135/78 Body Mass Index(BMI): 35 Temperature(F): 98.1 Respiratory Rate(breaths/min): 18 Photos: [1:No Photos] [N/A:N/A] Wound Location: [1:Right, Dorsal Foot] [N/A:N/A] Wounding Event: [1:Blister] [N/A:N/A] Primary Etiology: [1:Diabetic Wound/Ulcer of the Lower Extremity] [N/A:N/A] Comorbid History: [1:Congestive Heart Failure, Hypertension, Type II Diabetes, Received Radiation] [N/A:N/A] Date Acquired: [1:07/02/2020] [N/A:N/A] Weeks of Treatment: [1:2] [N/A:N/A] Wound Status: [1:Open] [N/A:N/A] Measurements L x W x D (cm) [1:5.5x3.7x1.2] [N/A:N/A] Area (cm) : [1:15.983] [N/A:N/A] Volume (cm) : [1:19.179] [N/A:N/A] %  Reduction in Area: [1:38.30%] [N/A:N/A] % Reduction in Volume: [1:-639.90%] [N/A:N/A] Classification: [1:Grade 2] [N/A:N/A] Exudate Amount: [1:Large] [N/A:N/A] Exudate Type: [1:Purulent] [N/A:N/A] Exudate Color: [1:yellow, brown, green] [N/A:N/A] Wound Margin: [1:Flat and Intact] [N/A:N/A] Granulation Amount: [1:None Present (0%)] [N/A:N/A] Necrotic Amount: [1:Large (67-100%)] [N/A:N/A] Exposed Structures: [1:Fat Layer (Subcutaneous Tissue): Yes Fascia: No Tendon: No Muscle: No Joint: No Bone: No] [N/A:N/A] Epithelialization: [1:None] [N/A:N/A] Debridement: [1:Debridement - Excisional] [N/A:N/A] Pre-procedure Verification/Time 08:51 [N/A:N/A] Out Taken: Tissue Debrided: [1:Necrotic/Eschar, Subcutaneous, Slough] [N/A:N/A] Level: [1:Skin/Subcutaneous Tissue] [N/A:N/A] Debridement Area (sq cm): [1:20.35] [N/A:N/A] Instrument: [1:Curette] [N/A:N/A] Bleeding: [1:Moderate] [N/A:N/A] Hemostasis Achieved: [1:Pressure] [N/A:N/A] Debridement Treatment [1:Procedure was tolerated well] [N/A:N/A] Response: Post Debridement [1:5.5x3.7x1.5] [N/A:N/A] Measurements L x W x D (cm) Post Debridement Volume: [1:23.974]  [N/A:N/A] (cm) Procedures Performed: [1:Debridement] [N/A:N/A] Treatment Notes Wound #1 (Right, Dorsal Foot) Chicas, Shakeita T. ([id number]) Notes SIlver Alginate, Xtrasorb, 3 layer bilateral for swelling Electronic Signature(s) Signed: 07/29/2020 7:28:08 AM By: Linton Ham MD Entered By: Linton Ham on 07/28/2020 09:02:34 [name] ([id number]) -------------------------------------------------------------------------------- Multi-Disciplinary Care Plan Details [name] Date of Service: 07/28/2020 8:15 AM Medical Record Number: [id number] Patient Account Number: [id number] Date of Birth/Sex: November 01, 1933 (86 y.o. F) Treating RN: Cornell Barman Primary Care Donelle Baba: Fulton Reek Other Clinician: Referring Bethaney Oshana: Fulton Reek Treating Najla Aughenbaugh/Extender: Tito Dine in Treatment: 2 Active Inactive Necrotic Tissue Nursing Diagnoses: Impaired tissue integrity related to necrotic/devitalized tissue Goals: Necrotic/devitalized tissue will be minimized in the wound bed Date Initiated: 07/14/2020 Target Resolution Date: 08/14/2020 Goal Status: Active Interventions: Assess patient pain level pre-, during and post procedure and prior to discharge Treatment Activities: Apply topical anesthetic as ordered : 07/14/2020 Notes: Orientation to the Wound Care Program Nursing Diagnoses: Knowledge deficit related to the wound healing center program Goals: Patient/caregiver will verbalize understanding of the Index Date Initiated: 07/14/2020 Target Resolution Date: 08/13/2020 Goal Status: Active Interventions: Provide education on orientation to the wound center Notes: Wound/Skin Impairment Nursing Diagnoses: Impaired tissue integrity Goals: Patient/caregiver will verbalize understanding of skin care regimen Date Initiated: 07/14/2020 Target Resolution Date: 07/14/2020 Goal Status:  Active Interventions: Assess ulceration(s) every visit Treatment Activities: Topical wound management initiated : 07/14/2020 Notes: Electronic Signature(s) Signed: 07/28/2020 5:48:54 PM By: Gretta Cool, BSN, RN, CWS, Kim RN, BSN Entered By: Gretta Cool, BSN, RN, CWS, Kim on 07/28/2020 08:47:18 [name] ([id number] Labrum, Kathi Der ([id number]) -------------------------------------------------------------------------------- Pain Assessment Details [name] Date of Service: 07/28/2020 8:15 AM Medical Record Number: [id number] Patient Account Number: [id number] Date of Birth/Sex: 1934-09-12 (86 y.o. F) Treating RN: Cornell Barman Primary Care Lashea Goda: Fulton Reek Other Clinician: Referring Breelyn Icard: Fulton Reek Treating Lailie Smead/Extender: Tito Dine in Treatment: 2 Active Problems Location of Pain Severity and Description of Pain Patient Has Paino Yes Site Locations Rate the pain. Current Pain Level: 5 Character of Pain Describe the Pain: Sharp, Tender Pain Management and Medication Current Pain Management: Notes Patient has pain in right foot. Electronic Signature(s) Signed: 07/28/2020 5:48:54 PM By: Gretta Cool, BSN, RN, CWS, Kim RN, BSN Entered By: Gretta Cool, BSN, RN, CWS, Kim on 07/28/2020 08:33:31 [name] ([id number]) -------------------------------------------------------------------------------- Patient/Caregiver Education Details [name] Date of Service: 07/28/2020 8:15 AM Medical Record Number: [id number] Patient Account Number: [id number] Date of Birth/Gender: October 16, 1933 (86 y.o. F) Treating RN: Cornell Barman Primary Care Physician: Fulton Reek Other Clinician: Referring Physician: Fulton Reek Treating Physician/Extender: Tito Dine in Treatment: 2 Education Assessment Education Provided To: Patient Education Topics Provided Wound Debridement:  Handouts: Wound  Debridement Methods: Demonstration, Explain/Verbal Responses: State content correctly Electronic Signature(s) Signed: 07/28/2020 5:48:54 PM By: Gretta Cool, BSN, RN, CWS, Kim RN, BSN Entered By: Gretta Cool, BSN, RN, CWS, Kim on 07/28/2020 08:52:57 [name] ([id number]) -------------------------------------------------------------------------------- Wound Assessment Details [name] Date of Service: 07/28/2020 8:15 AM Medical Record Number: [id number] Patient Account Number: [id number] Date of Birth/Sex: [birth year] (86 y.o. F) Treating RN: Cornell Barman Primary Care Desirie Minteer: Fulton Reek Other Clinician: Referring Kynsli Haapala: Fulton Reek Treating Bernardo Brayman/Extender: Tito Dine in Treatment: 2 Wound Status Wound Number: 1 Primary Diabetic Wound/Ulcer of the Lower Extremity Etiology: Wound Location: Right, Dorsal Foot Wound Status: Open Wounding Event: Blister Comorbid Congestive Heart Failure, Hypertension, Type II Date Acquired: 07/02/2020 History: Diabetes, Received Radiation Weeks Of Treatment: 2 Clustered Wound: No Photos Photo Uploaded By: Georges Mouse, Minus Breeding on 07/28/2020 11:45:15 Wound Measurements Length: (cm) 5.5 Width: (cm) 3.7 Depth: (cm) 1.2 Area: (cm) 15.983 Volume: (cm) 19.179 % Reduction in Area: 38.3% % Reduction in Volume: -639.9% Epithelialization: None Wound Description Classification: Grade 2 Wound Margin: Flat and Intact Exudate Amount: Large Exudate Type: Purulent Exudate Color: yellow, brown, green Foul Odor After Cleansing: No Slough/Fibrino Yes Wound Bed Granulation Amount: None Present (0%) Exposed Structure Necrotic Amount: Large (67-100%) Fascia Exposed: No Necrotic Quality: Adherent Slough Fat Layer (Subcutaneous Tissue) Exposed: Yes Tendon Exposed: No Muscle Exposed: No Joint Exposed: No Bone Exposed: No Treatment Notes Wound #1 (Right, Dorsal Foot) Notes SIlver Alginate, Xtrasorb, 3  layer bilateral for swelling Electronic Signature(s) LORILYN, LAITINEN ([id number]) Signed: 07/28/2020 5:48:54 PM By: Gretta Cool, BSN, RN, CWS, Kim RN, BSN Entered By: Gretta Cool, BSN, RN, CWS, Kim on 07/28/2020 08:39:31 [name] ([id number]) -------------------------------------------------------------------------------- Vitals Details [name] Date of Service: 07/28/2020 8:15 AM Medical Record Number: [id number] Patient Account Number: [id number] Date of Birth/Sex: Jan 27, 1934 (86 y.o. F) Treating RN: Cornell Barman Primary Care Nixxon Faria: Fulton Reek Other Clinician: Referring Khori Rosevear: Fulton Reek Treating Estella Malatesta/Extender: Tito Dine in Treatment: 2 Vital Signs Time Taken: 08:20 Temperature (F): 98.1 Height (in): 64 Pulse (bpm): 91 Weight (lbs): 205 Respiratory Rate (breaths/min): 18 Body Mass Index (BMI): 35.2 Blood Pressure (mmHg): 135/78 Reference Range: 80 - 120 mg / dl Electronic Signature(s) Signed: 07/28/2020 3:14:44 PM By: Lorine Bears RCP, RRT, CHT Entered By: Becky Sax, Amado Nash on 07/28/2020 08:22:57

## 2020-08-03 ENCOUNTER — Other Ambulatory Visit: Payer: Self-pay

## 2020-08-03 ENCOUNTER — Encounter: Payer: Self-pay | Admitting: *Deleted

## 2020-08-03 ENCOUNTER — Ambulatory Visit: Payer: Medicare Other | Admitting: Cardiovascular Disease

## 2020-08-03 DIAGNOSIS — E1122 Type 2 diabetes mellitus with diabetic chronic kidney disease: Secondary | ICD-10-CM | POA: Diagnosis present

## 2020-08-03 DIAGNOSIS — N186 End stage renal disease: Secondary | ICD-10-CM | POA: Diagnosis present

## 2020-08-03 DIAGNOSIS — E11621 Type 2 diabetes mellitus with foot ulcer: Secondary | ICD-10-CM | POA: Diagnosis present

## 2020-08-03 DIAGNOSIS — E785 Hyperlipidemia, unspecified: Secondary | ICD-10-CM | POA: Diagnosis present

## 2020-08-03 DIAGNOSIS — Z90710 Acquired absence of both cervix and uterus: Secondary | ICD-10-CM

## 2020-08-03 DIAGNOSIS — E876 Hypokalemia: Secondary | ICD-10-CM | POA: Diagnosis present

## 2020-08-03 DIAGNOSIS — T80211A Bloodstream infection due to central venous catheter, initial encounter: Principal | ICD-10-CM | POA: Diagnosis present

## 2020-08-03 DIAGNOSIS — R7881 Bacteremia: Secondary | ICD-10-CM | POA: Diagnosis present

## 2020-08-03 DIAGNOSIS — Z91041 Radiographic dye allergy status: Secondary | ICD-10-CM

## 2020-08-03 DIAGNOSIS — R197 Diarrhea, unspecified: Secondary | ICD-10-CM | POA: Diagnosis present

## 2020-08-03 DIAGNOSIS — Z961 Presence of intraocular lens: Secondary | ICD-10-CM | POA: Diagnosis present

## 2020-08-03 DIAGNOSIS — Z923 Personal history of irradiation: Secondary | ICD-10-CM

## 2020-08-03 DIAGNOSIS — N2581 Secondary hyperparathyroidism of renal origin: Secondary | ICD-10-CM | POA: Diagnosis present

## 2020-08-03 DIAGNOSIS — D631 Anemia in chronic kidney disease: Secondary | ICD-10-CM | POA: Diagnosis present

## 2020-08-03 DIAGNOSIS — Z9842 Cataract extraction status, left eye: Secondary | ICD-10-CM

## 2020-08-03 DIAGNOSIS — L97519 Non-pressure chronic ulcer of other part of right foot with unspecified severity: Secondary | ICD-10-CM | POA: Diagnosis present

## 2020-08-03 DIAGNOSIS — E1140 Type 2 diabetes mellitus with diabetic neuropathy, unspecified: Secondary | ICD-10-CM | POA: Diagnosis present

## 2020-08-03 DIAGNOSIS — Z87891 Personal history of nicotine dependence: Secondary | ICD-10-CM

## 2020-08-03 DIAGNOSIS — Y848 Other medical procedures as the cause of abnormal reaction of the patient, or of later complication, without mention of misadventure at the time of the procedure: Secondary | ICD-10-CM | POA: Diagnosis present

## 2020-08-03 DIAGNOSIS — Z7902 Long term (current) use of antithrombotics/antiplatelets: Secondary | ICD-10-CM

## 2020-08-03 DIAGNOSIS — I120 Hypertensive chronic kidney disease with stage 5 chronic kidney disease or end stage renal disease: Secondary | ICD-10-CM | POA: Diagnosis present

## 2020-08-03 DIAGNOSIS — K219 Gastro-esophageal reflux disease without esophagitis: Secondary | ICD-10-CM | POA: Diagnosis present

## 2020-08-03 DIAGNOSIS — Z8249 Family history of ischemic heart disease and other diseases of the circulatory system: Secondary | ICD-10-CM

## 2020-08-03 DIAGNOSIS — Z888 Allergy status to other drugs, medicaments and biological substances status: Secondary | ICD-10-CM

## 2020-08-03 DIAGNOSIS — I69341 Monoplegia of lower limb following cerebral infarction affecting right dominant side: Secondary | ICD-10-CM

## 2020-08-03 DIAGNOSIS — Z7982 Long term (current) use of aspirin: Secondary | ICD-10-CM

## 2020-08-03 DIAGNOSIS — E039 Hypothyroidism, unspecified: Secondary | ICD-10-CM | POA: Diagnosis present

## 2020-08-03 DIAGNOSIS — Z951 Presence of aortocoronary bypass graft: Secondary | ICD-10-CM

## 2020-08-03 DIAGNOSIS — Z992 Dependence on renal dialysis: Secondary | ICD-10-CM

## 2020-08-03 DIAGNOSIS — I2510 Atherosclerotic heart disease of native coronary artery without angina pectoris: Secondary | ICD-10-CM | POA: Diagnosis present

## 2020-08-03 DIAGNOSIS — B957 Other staphylococcus as the cause of diseases classified elsewhere: Secondary | ICD-10-CM | POA: Diagnosis present

## 2020-08-03 DIAGNOSIS — D696 Thrombocytopenia, unspecified: Secondary | ICD-10-CM | POA: Diagnosis present

## 2020-08-03 DIAGNOSIS — Z9841 Cataract extraction status, right eye: Secondary | ICD-10-CM

## 2020-08-03 DIAGNOSIS — Z20822 Contact with and (suspected) exposure to covid-19: Secondary | ICD-10-CM | POA: Diagnosis present

## 2020-08-03 DIAGNOSIS — L89152 Pressure ulcer of sacral region, stage 2: Secondary | ICD-10-CM | POA: Diagnosis present

## 2020-08-03 DIAGNOSIS — Z83438 Family history of other disorder of lipoprotein metabolism and other lipidemia: Secondary | ICD-10-CM

## 2020-08-03 DIAGNOSIS — Z853 Personal history of malignant neoplasm of breast: Secondary | ICD-10-CM

## 2020-08-03 DIAGNOSIS — Z79890 Hormone replacement therapy (postmenopausal): Secondary | ICD-10-CM

## 2020-08-03 DIAGNOSIS — H3530 Unspecified macular degeneration: Secondary | ICD-10-CM | POA: Diagnosis present

## 2020-08-03 DIAGNOSIS — Z79899 Other long term (current) drug therapy: Secondary | ICD-10-CM

## 2020-08-03 DIAGNOSIS — Z803 Family history of malignant neoplasm of breast: Secondary | ICD-10-CM

## 2020-08-03 LAB — COMPREHENSIVE METABOLIC PANEL
ALT: 7 U/L (ref 0–44)
AST: 12 U/L — ABNORMAL LOW (ref 15–41)
Albumin: 2.9 g/dL — ABNORMAL LOW (ref 3.5–5.0)
Alkaline Phosphatase: 68 U/L (ref 38–126)
Anion gap: 11 (ref 5–15)
BUN: 37 mg/dL — ABNORMAL HIGH (ref 8–23)
CO2: 26 mmol/L (ref 22–32)
Calcium: 6.7 mg/dL — ABNORMAL LOW (ref 8.9–10.3)
Chloride: 96 mmol/L — ABNORMAL LOW (ref 98–111)
Creatinine, Ser: 2.82 mg/dL — ABNORMAL HIGH (ref 0.44–1.00)
GFR, Estimated: 16 mL/min — ABNORMAL LOW (ref >60)
Glucose, Bld: 272 mg/dL — ABNORMAL HIGH (ref 70–99)
Potassium: 4.7 mmol/L (ref 3.5–5.1)
Sodium: 133 mmol/L — ABNORMAL LOW (ref 135–145)
Total Bilirubin: 0.8 mg/dL (ref 0.3–1.2)
Total Protein: 6.5 g/dL (ref 6.5–8.1)

## 2020-08-03 LAB — CBC
HCT: 27 % — ABNORMAL LOW (ref 36.0–46.0)
Hemoglobin: 8.6 g/dL — ABNORMAL LOW (ref 12.0–15.0)
MCH: 30.9 pg (ref 26.0–34.0)
MCHC: 31.9 g/dL (ref 30.0–36.0)
MCV: 97.1 fL (ref 80.0–100.0)
Platelets: 163 10*3/uL (ref 150–400)
RBC: 2.78 MIL/uL — ABNORMAL LOW (ref 3.87–5.11)
RDW: 17.8 % — ABNORMAL HIGH (ref 11.5–15.5)
WBC: 8.8 10*3/uL (ref 4.0–10.5)
nRBC: 0 % (ref 0.0–0.2)

## 2020-08-03 LAB — LIPASE, BLOOD: Lipase: 34 U/L (ref 11–51)

## 2020-08-03 NOTE — ED Notes (Signed)
Husband's # [phone]

## 2020-08-03 NOTE — ED Triage Notes (Addendum)
Pt ambulatory to triage with a walker.  Pt sent by dr for eval of infection in blood per pt.  Pt had partial dialysis today.  Pt had diarrhea x 2 today.  No chest pain or sob.  No abd pain.  No vomiting.  Pt alert   Speech clear.

## 2020-08-04 ENCOUNTER — Emergency Department: Payer: Medicare Other

## 2020-08-04 ENCOUNTER — Inpatient Hospital Stay: Admit: 2020-08-04 | Payer: Medicare Other

## 2020-08-04 ENCOUNTER — Inpatient Hospital Stay: Payer: Medicare Other

## 2020-08-04 ENCOUNTER — Ambulatory Visit: Payer: Medicare Other | Admitting: Internal Medicine

## 2020-08-04 ENCOUNTER — Inpatient Hospital Stay
Admission: EM | Admit: 2020-08-04 | Discharge: 2020-08-11 | DRG: 314 | Disposition: A | Discharge status: Home / Self Care | Payer: Medicare Other | Attending: Internal Medicine | Admitting: Internal Medicine

## 2020-08-04 DIAGNOSIS — I10 Essential (primary) hypertension: Secondary | ICD-10-CM | POA: Diagnosis present

## 2020-08-04 DIAGNOSIS — Z90710 Acquired absence of both cervix and uterus: Secondary | ICD-10-CM | POA: Diagnosis not present

## 2020-08-04 DIAGNOSIS — N185 Chronic kidney disease, stage 5: Secondary | ICD-10-CM | POA: Diagnosis not present

## 2020-08-04 DIAGNOSIS — I69341 Monoplegia of lower limb following cerebral infarction affecting right dominant side: Secondary | ICD-10-CM | POA: Diagnosis not present

## 2020-08-04 DIAGNOSIS — N186 End stage renal disease: Secondary | ICD-10-CM

## 2020-08-04 DIAGNOSIS — Z888 Allergy status to other drugs, medicaments and biological substances status: Secondary | ICD-10-CM | POA: Diagnosis not present

## 2020-08-04 DIAGNOSIS — R262 Difficulty in walking, not elsewhere classified: Secondary | ICD-10-CM

## 2020-08-04 DIAGNOSIS — I120 Hypertensive chronic kidney disease with stage 5 chronic kidney disease or end stage renal disease: Secondary | ICD-10-CM | POA: Diagnosis present

## 2020-08-04 DIAGNOSIS — Z992 Dependence on renal dialysis: Secondary | ICD-10-CM | POA: Diagnosis not present

## 2020-08-04 DIAGNOSIS — T80211A Bloodstream infection due to central venous catheter, initial encounter: Secondary | ICD-10-CM | POA: Diagnosis present

## 2020-08-04 DIAGNOSIS — T148XXA Other injury of unspecified body region, initial encounter: Secondary | ICD-10-CM | POA: Diagnosis not present

## 2020-08-04 DIAGNOSIS — D696 Thrombocytopenia, unspecified: Secondary | ICD-10-CM | POA: Diagnosis not present

## 2020-08-04 DIAGNOSIS — N184 Chronic kidney disease, stage 4 (severe): Secondary | ICD-10-CM | POA: Diagnosis not present

## 2020-08-04 DIAGNOSIS — Z20822 Contact with and (suspected) exposure to covid-19: Secondary | ICD-10-CM | POA: Diagnosis present

## 2020-08-04 DIAGNOSIS — L089 Local infection of the skin and subcutaneous tissue, unspecified: Secondary | ICD-10-CM | POA: Diagnosis present

## 2020-08-04 DIAGNOSIS — I6930 Unspecified sequelae of cerebral infarction: Secondary | ICD-10-CM

## 2020-08-04 DIAGNOSIS — B957 Other staphylococcus as the cause of diseases classified elsewhere: Secondary | ICD-10-CM | POA: Diagnosis present

## 2020-08-04 DIAGNOSIS — B958 Unspecified staphylococcus as the cause of diseases classified elsewhere: Secondary | ICD-10-CM

## 2020-08-04 DIAGNOSIS — E1122 Type 2 diabetes mellitus with diabetic chronic kidney disease: Secondary | ICD-10-CM | POA: Diagnosis present

## 2020-08-04 DIAGNOSIS — Z9842 Cataract extraction status, left eye: Secondary | ICD-10-CM | POA: Diagnosis not present

## 2020-08-04 DIAGNOSIS — E1121 Type 2 diabetes mellitus with diabetic nephropathy: Secondary | ICD-10-CM | POA: Diagnosis present

## 2020-08-04 DIAGNOSIS — Z951 Presence of aortocoronary bypass graft: Secondary | ICD-10-CM | POA: Diagnosis not present

## 2020-08-04 DIAGNOSIS — R7881 Bacteremia: Secondary | ICD-10-CM

## 2020-08-04 DIAGNOSIS — Z91041 Radiographic dye allergy status: Secondary | ICD-10-CM | POA: Diagnosis not present

## 2020-08-04 DIAGNOSIS — I2510 Atherosclerotic heart disease of native coronary artery without angina pectoris: Secondary | ICD-10-CM | POA: Diagnosis present

## 2020-08-04 DIAGNOSIS — L97518 Non-pressure chronic ulcer of other part of right foot with other specified severity: Secondary | ICD-10-CM | POA: Diagnosis not present

## 2020-08-04 DIAGNOSIS — E785 Hyperlipidemia, unspecified: Secondary | ICD-10-CM | POA: Diagnosis present

## 2020-08-04 DIAGNOSIS — N2581 Secondary hyperparathyroidism of renal origin: Secondary | ICD-10-CM | POA: Diagnosis present

## 2020-08-04 DIAGNOSIS — L97515 Non-pressure chronic ulcer of other part of right foot with muscle involvement without evidence of necrosis: Secondary | ICD-10-CM | POA: Diagnosis not present

## 2020-08-04 DIAGNOSIS — Z923 Personal history of irradiation: Secondary | ICD-10-CM | POA: Diagnosis not present

## 2020-08-04 DIAGNOSIS — Z853 Personal history of malignant neoplasm of breast: Secondary | ICD-10-CM | POA: Diagnosis not present

## 2020-08-04 DIAGNOSIS — S91301A Unspecified open wound, right foot, initial encounter: Secondary | ICD-10-CM | POA: Diagnosis not present

## 2020-08-04 DIAGNOSIS — E876 Hypokalemia: Secondary | ICD-10-CM | POA: Diagnosis present

## 2020-08-04 DIAGNOSIS — D649 Anemia, unspecified: Secondary | ICD-10-CM | POA: Diagnosis not present

## 2020-08-04 DIAGNOSIS — D631 Anemia in chronic kidney disease: Secondary | ICD-10-CM

## 2020-08-04 DIAGNOSIS — D62 Acute posthemorrhagic anemia: Secondary | ICD-10-CM | POA: Diagnosis not present

## 2020-08-04 DIAGNOSIS — L97509 Non-pressure chronic ulcer of other part of unspecified foot with unspecified severity: Secondary | ICD-10-CM

## 2020-08-04 DIAGNOSIS — Y848 Other medical procedures as the cause of abnormal reaction of the patient, or of later complication, without mention of misadventure at the time of the procedure: Secondary | ICD-10-CM | POA: Diagnosis present

## 2020-08-04 DIAGNOSIS — E039 Hypothyroidism, unspecified: Secondary | ICD-10-CM | POA: Diagnosis present

## 2020-08-04 DIAGNOSIS — E119 Type 2 diabetes mellitus without complications: Secondary | ICD-10-CM

## 2020-08-04 DIAGNOSIS — R5381 Other malaise: Secondary | ICD-10-CM

## 2020-08-04 DIAGNOSIS — K219 Gastro-esophageal reflux disease without esophagitis: Secondary | ICD-10-CM | POA: Diagnosis present

## 2020-08-04 DIAGNOSIS — L89152 Pressure ulcer of sacral region, stage 2: Secondary | ICD-10-CM | POA: Diagnosis present

## 2020-08-04 DIAGNOSIS — L8990 Pressure ulcer of unspecified site, unspecified stage: Secondary | ICD-10-CM | POA: Insufficient documentation

## 2020-08-04 LAB — GLUCOSE, CAPILLARY
Glucose-Capillary: 111 mg/dL — ABNORMAL HIGH (ref 70–99)
Glucose-Capillary: 31 mg/dL — CL (ref 70–99)
Glucose-Capillary: 52 mg/dL — ABNORMAL LOW (ref 70–99)
Glucose-Capillary: 88 mg/dL (ref 70–99)
Glucose-Capillary: 72 mg/dL (ref 70–99)
Glucose-Capillary: 63 mg/dL — ABNORMAL LOW (ref 70–99)
Glucose-Capillary: 94 mg/dL (ref 70–99)
Glucose-Capillary: 75 mg/dL (ref 70–99)

## 2020-08-04 LAB — RESPIRATORY PANEL BY RT PCR (FLU A&B, COVID)
Influenza A by PCR: NEGATIVE
Influenza B by PCR: NEGATIVE
SARS Coronavirus 2 by RT PCR: NEGATIVE

## 2020-08-04 LAB — CBC
HCT: 28.4 % — ABNORMAL LOW (ref 36.0–46.0)
Hemoglobin: 9 g/dL — ABNORMAL LOW (ref 12.0–15.0)
MCH: 31.3 pg (ref 26.0–34.0)
MCHC: 31.7 g/dL (ref 30.0–36.0)
MCV: 98.6 fL (ref 80.0–100.0)
Platelets: 132 10*3/uL — ABNORMAL LOW (ref 150–400)
RBC: 2.88 MIL/uL — ABNORMAL LOW (ref 3.87–5.11)
RDW: 18.3 % — ABNORMAL HIGH (ref 11.5–15.5)
WBC: 8.5 10*3/uL (ref 4.0–10.5)
nRBC: 0 % (ref 0.0–0.2)

## 2020-08-04 LAB — CREATININE, SERUM
Creatinine, Ser: 3.33 mg/dL — ABNORMAL HIGH (ref 0.44–1.00)
GFR, Estimated: 13 mL/min — ABNORMAL LOW (ref >60)

## 2020-08-04 LAB — PROCALCITONIN: Procalcitonin: 0.41 ng/mL

## 2020-08-04 LAB — LACTIC ACID, PLASMA: Lactic Acid, Venous: 1.2 mmol/L (ref 0.5–1.9)

## 2020-08-04 MED ORDER — HEPARIN SODIUM (PORCINE) 5000 UNIT/ML IJ SOLN
5000.0000 [IU] | Freq: Three times a day (TID) | INTRAMUSCULAR | Status: DC
  Administered 2020-08-04 – 2020-08-06 (×6): 5000 [IU] via SUBCUTANEOUS
  Filled 2020-08-04 (×6): qty 1

## 2020-08-04 MED ORDER — LEVOTHYROXINE SODIUM 50 MCG PO TABS
150.0000 ug | ORAL_TABLET | Freq: Every day | ORAL | Status: DC
  Administered 2020-08-05 – 2020-08-11 (×6): 150 ug via ORAL
  Filled 2020-08-04 (×4): qty 1
  Filled 2020-08-04: qty 3
  Filled 2020-08-04: qty 1

## 2020-08-04 MED ORDER — GABAPENTIN 100 MG PO CAPS
200.0000 mg | ORAL_CAPSULE | Freq: Two times a day (BID) | ORAL | Status: DC
  Administered 2020-08-04 – 2020-08-11 (×15): 200 mg via ORAL
  Filled 2020-08-04 (×15): qty 2

## 2020-08-04 MED ORDER — SODIUM CHLORIDE 0.9 % IV SOLN: 1.0000 g | INTRAVENOUS | Status: DC

## 2020-08-04 MED ORDER — AMLODIPINE BESYLATE 5 MG PO TABS
5.0000 mg | ORAL_TABLET | Freq: Every day | ORAL | Status: DC
  Administered 2020-08-04 – 2020-08-09 (×4): 5 mg via ORAL
  Filled 2020-08-04 (×5): qty 1

## 2020-08-04 MED ORDER — OXYCODONE HCL 5 MG PO TABS
5.0000 mg | ORAL_TABLET | ORAL | Status: DC | PRN
  Administered 2020-08-04 – 2020-08-10 (×9): 5 mg via ORAL
  Filled 2020-08-04 (×9): qty 1

## 2020-08-04 MED ORDER — METOPROLOL TARTRATE 50 MG PO TABS
50.0000 mg | ORAL_TABLET | Freq: Two times a day (BID) | ORAL | Status: DC
  Administered 2020-08-04 – 2020-08-10 (×10): 50 mg via ORAL
  Filled 2020-08-04 (×10): qty 1
  Filled 2020-08-04: qty 2
  Filled 2020-08-04: qty 1

## 2020-08-04 MED ORDER — LOPERAMIDE HCL 2 MG PO CAPS
2.0000 mg | ORAL_CAPSULE | ORAL | Status: DC | PRN
  Administered 2020-08-04 – 2020-08-05 (×2): 2 mg via ORAL
  Filled 2020-08-04 (×2): qty 1

## 2020-08-04 MED ORDER — CLOPIDOGREL BISULFATE 75 MG PO TABS
75.0000 mg | ORAL_TABLET | Freq: Every day | ORAL | Status: DC
  Administered 2020-08-04 – 2020-08-11 (×7): 75 mg via ORAL
  Filled 2020-08-04 (×7): qty 1

## 2020-08-04 MED ORDER — VANCOMYCIN HCL IN DEXTROSE 1-5 GM/200ML-% IV SOLN: 1000.0000 mg | Freq: Once | INTRAVENOUS | Status: DC

## 2020-08-04 MED ORDER — PIPERACILLIN-TAZOBACTAM IN DEX 2-0.25 GM/50ML IV SOLN
2.2500 g | Freq: Three times a day (TID) | INTRAVENOUS | Status: DC
  Administered 2020-08-04 – 2020-08-11 (×20): 2.25 g via INTRAVENOUS
  Filled 2020-08-04 (×25): qty 50

## 2020-08-04 MED ORDER — DEXTROSE 50 % IV SOLN
12.5000 g | Freq: Once | INTRAVENOUS | Status: AC
  Administered 2020-08-04: 12.5 g via INTRAVENOUS
  Filled 2020-08-04: qty 50

## 2020-08-04 MED ORDER — INSULIN ASPART 100 UNIT/ML ~~LOC~~ SOLN
0.0000 [IU] | Freq: Three times a day (TID) | SUBCUTANEOUS | Status: DC
  Administered 2020-08-07: 2 [IU] via SUBCUTANEOUS
  Administered 2020-08-08 – 2020-08-11 (×3): 1 [IU] via SUBCUTANEOUS
  Filled 2020-08-04 (×5): qty 1

## 2020-08-04 MED ORDER — SODIUM CHLORIDE 0.9 % IV SOLN
1.0000 g | Freq: Once | INTRAVENOUS | Status: AC
  Administered 2020-08-04: 1 g via INTRAVENOUS
  Filled 2020-08-04: qty 1

## 2020-08-04 MED ORDER — VANCOMYCIN HCL IN DEXTROSE 1-5 GM/200ML-% IV SOLN
1000.0000 mg | INTRAVENOUS | Status: DC
  Administered 2020-08-05: 1000 mg via INTRAVENOUS
  Filled 2020-08-04: qty 200

## 2020-08-04 MED ORDER — DEXTROSE 50 % IV SOLN
12.5000 g | Freq: Once | INTRAVENOUS | Status: AC
  Administered 2020-08-04: 12.5 g via INTRAVENOUS

## 2020-08-04 MED ORDER — DEXTROSE-NACL 5-0.9 % IV SOLN: INTRAVENOUS | Status: DC

## 2020-08-04 MED ORDER — VANCOMYCIN HCL 1500 MG/300ML IV SOLN
1500.0000 mg | Freq: Once | INTRAVENOUS | Status: AC
  Administered 2020-08-04: 1500 mg via INTRAVENOUS
  Filled 2020-08-04: qty 300

## 2020-08-04 MED ORDER — CHLORHEXIDINE GLUCONATE CLOTH 2 % EX PADS
6.0000 | MEDICATED_PAD | Freq: Every day | CUTANEOUS | Status: DC
  Administered 2020-08-05 – 2020-08-11 (×4): 6 via TOPICAL

## 2020-08-04 MED ORDER — DEXTROSE 50 % IV SOLN
INTRAVENOUS | Status: AC
  Filled 2020-08-04: qty 50

## 2020-08-04 MED ORDER — INSULIN ASPART 100 UNIT/ML ~~LOC~~ SOLN
0.0000 [IU] | Freq: Every day | SUBCUTANEOUS | Status: DC
  Administered 2020-08-06: 1 [IU] via SUBCUTANEOUS
  Filled 2020-08-04: qty 1

## 2020-08-04 NOTE — Progress Notes (Signed)
[facility]  ROUNDING NOTE   Subjective:  [name] is well known to our practice. Patient was sent in to ED from dialysis clinic for bacteremia.She has bilateral foot wounds, getting treated by wound clinic. Wound cultures with Pseudomonas, Enterococcus and staph aureus. She has h/o ESRD on dialysis TTS.She received her full dialysis treatment yesterday.We will arrange dialysis for her tomorrow.   Objective:  Vital signs in last 24 hours:  Temp:  [97.4 F (36.3 C)-98.6 F (37 C)] 98.2 F (36.8 C) (11/03 1137) Pulse Rate:  [64-89] 82 (11/03 1137) Resp:  [18-20] 20 (11/03 1137) BP: (106-144)/(47-66) 144/58 (11/03 1137) SpO2:  [92 %-100 %] 97 % (11/03 1137) Weight:  [84.4 kg-92.4 kg] 92.4 kg (11/03 0602)  Weight change:  Filed Weights   08/03/20 1554 08/04/20 0602  Weight: 84.4 kg 92.4 kg    Intake/Output: No intake/output data recorded.   Intake/Output this shift:  No intake/output data recorded.  Physical Exam: General: In no acute distress, husband at bedside   Head: Moist oral mucosal membranes  Eyes: Anicteric  Lungs:  Clear to auscultation bilaterally, normal and symmetrical respiratory effort  Heart: Regular rate and rhythm  Abdomen:  Soft, nontender,   Extremities:  2+peripheral edema, lower legs wrapped with dressing  Neurologic: Oriented, moving all four extremities  Skin: Bilateral lower legs with dressing  Access: Rt IJ Permcath    Basic Metabolic Panel: Recent Labs  Lab 08/03/20 1557 08/04/20 0714  NA 133*  --   K 4.7  --   CL 96*  --   CO2 26  --   GLUCOSE 272*  --   BUN 37*  --   CREATININE 2.82* 3.33*  CALCIUM 6.7*  --     Liver Function Tests: Recent Labs  Lab 08/03/20 1557  AST 12*  ALT 7  ALKPHOS 68  BILITOT 0.8  PROT 6.5  ALBUMIN 2.9*   Recent Labs  Lab 08/03/20 1557  LIPASE 34   No results for input(s): AMMONIA in the last 168 hours.  CBC: Recent Labs  Lab 08/03/20 1557 08/04/20 0714  WBC 8.8 8.5   HGB 8.6* 9.0*  HCT 27.0* 28.4*  MCV 97.1 98.6  PLT 163 132*    Cardiac Enzymes: No results for input(s): CKTOTAL, CKMB, CKMBINDEX, TROPONINI in the last 168 hours.  BNP: Invalid input(s): POCBNP  CBG: Recent Labs  Lab 08/04/20 0638 08/04/20 0651 08/04/20 0745 08/04/20 0912 08/04/20 1141  GLUCAP 31* 111* 52* 88 55    Microbiology: Results for orders placed or performed during the hospital encounter of 08/04/20  Culture, blood (routine x 2)     Status: None (Preliminary result)   Collection Time: 08/04/20  3:09 AM   Specimen: BLOOD  Result Value Ref Range Status   Specimen Description BLOOD BLOOD LEFT ARM  Final   Special Requests   Final    BOTTLES DRAWN AEROBIC AND ANAEROBIC Blood Culture results may not be optimal due to an inadequate volume of blood received in culture bottles   Culture   Final    NO GROWTH < 12 HOURS Performed at [facility], [address] [place], [place] [address]    Report Status PENDING  Incomplete  Culture, blood (routine x 2)     Status: None (Preliminary result)   Collection Time: 08/04/20  3:09 AM   Specimen: BLOOD  Result Value Ref Range Status   Specimen Description BLOOD LEFT ANTECUBITAL  Final   Special Requests   Final  BOTTLES DRAWN AEROBIC AND ANAEROBIC Blood Culture results may not be optimal due to an inadequate volume of blood received in culture bottles   Culture   Final    NO GROWTH < 12 HOURS Performed at [facility], [address] [place], [place] [address]    Report Status PENDING  Incomplete  Respiratory Panel by RT PCR (Flu A&B, Covid) - Nasopharyngeal Swab     Status: None   Collection Time: 08/04/20  3:09 AM   Specimen: Nasopharyngeal Swab  Result Value Ref Range Status   SARS Coronavirus 2 by RT PCR NEGATIVE NEGATIVE Final    Comment: (NOTE) SARS-CoV-2 target nucleic acids are NOT DETECTED.  The SARS-CoV-2 RNA is generally detectable in upper respiratoy specimens during the acute  phase of infection. The lowest concentration of SARS-CoV-2 viral copies this assay can detect is 131 copies/mL. A negative result does not preclude SARS-Cov-2 infection and should not be used as the sole basis for treatment or other patient management decisions. A negative result may occur with  improper specimen collection/handling, submission of specimen other than nasopharyngeal swab, presence of viral mutation(s) within the areas targeted by this assay, and inadequate number of viral copies (<131 copies/mL). A negative result must be combined with clinical observations, patient history, and epidemiological information. The expected result is Negative.  Fact Sheet for Patients:  PinkCheek.be  Fact Sheet for Healthcare Providers:  GravelBags.it  This test is no t yet approved or cleared by the [place] FDA and  has been authorized for detection and/or diagnosis of SARS-CoV-2 by FDA under an Emergency Use Authorization (EUA). This EUA will remain  in effect (meaning this test can be used) for the duration of the COVID-19 declaration under Section 564(b)(1) of the Act, 21 U.S.C. section 360bbb-3(b)(1), unless the authorization is terminated or revoked sooner.     Influenza A by PCR NEGATIVE NEGATIVE Final   Influenza B by PCR NEGATIVE NEGATIVE Final    Comment: (NOTE) The Xpert Xpress SARS-CoV-2/FLU/RSV assay is intended as an aid in  the diagnosis of influenza from Nasopharyngeal swab specimens and  should not be used as a sole basis for treatment. Nasal washings and  aspirates are unacceptable for Xpert Xpress SARS-CoV-2/FLU/RSV  testing.  Fact Sheet for Patients: PinkCheek.be  Fact Sheet for Healthcare Providers: GravelBags.it  This test is not yet approved or cleared by the [place] FDA and  has been authorized for detection and/or diagnosis of  SARS-CoV-2 by  FDA under an Emergency Use Authorization (EUA). This EUA will remain  in effect (meaning this test can be used) for the duration of the  Covid-19 declaration under Section 564(b)(1) of the Act, 21  U.S.C. section 360bbb-3(b)(1), unless the authorization is  terminated or revoked. Performed at [facility], [address] [place],  [address]     Coagulation Studies: No results for input(s): LABPROT, INR in the last 72 hours.  Urinalysis: No results for input(s): COLORURINE, LABSPEC, PHURINE, GLUCOSEU, HGBUR, BILIRUBINUR, KETONESUR, PROTEINUR, UROBILINOGEN, NITRITE, LEUKOCYTESUR in the last 72 hours.  Invalid input(s): APPERANCEUR    Imaging: DG Chest Port 1 View  Result Date: 08/04/2020 CLINICAL DATA:  Weakness EXAM: PORTABLE CHEST 1 VIEW COMPARISON:  06/23/2020 FINDINGS: Geryl Councilman overlies the lung apices. Minimal left basilar atelectasis. No definite pneumothorax. No pleural effusion. Right internal jugular hemodialysis catheter tip overlies the superior vena cava. Coronary artery bypass grafting has been performed. Mild cardiomegaly is stable. Pulmonary vascularity is normal. IMPRESSION: Interval placement of right  internal jugular hemodialysis catheter. Tip within the SVC. Stable cardiomegaly. Electronically Signed   By: Fidela Salisbury MD   On: 08/04/2020 03:12     Medications:    dextrose 5 % and 0.9% NaCl 40 mL/hr at 08/04/20 0819   piperacillin-tazobactam (ZOSYN)  IV 2.25 g (08/04/20 1438)   [START ON 08/05/2020] vancomycin      amLODipine  5 mg Oral Daily   clopidogrel  75 mg Oral Daily   dextrose       gabapentin  200 mg Oral BID   heparin  5,000 Units Subcutaneous Q8H   insulin aspart  0-5 Units Subcutaneous QHS   insulin aspart  0-9 Units Subcutaneous TID WC   levothyroxine  150 mcg Oral Q0600   metoprolol tartrate  50 mg Oral BID   loperamide, oxyCODONE  Assessment/ Plan:  [name] is a 86 y.o.  female  well  known to our practice. Patient was sent in to ED from dialysis clinic for bacteremia.She has bilateral foot wounds, getting treated by wound clinic. Wound cultures with Pseudomonas, Enterococcus and staph aureus. She has h/o ESRD on dialysis TTS.  #ESRD on dialysis TTS   She received her full dialysis treatment yesterday. Volume and electrolyte status acceptable No additional dialysis required We will arrange dialysis for her tomorrow.  #Diabetes Type 2 with CKD Blood glucose readings elevated On sliding scale Insulin   # Hypertension Blood Pressure readings within acceptable range  Continue current antihypertensive regimen  #Secondary Hyperparathyroidism  Phosphorus 6.8 on 06/28/2020 Calcium 6.7 Will continue monitoring bone mineral metabolism parameters  # Anemia of CKD Hemoglobin 9.0 today Will consider Epogen with dialysis treatments  #Bacteremia Awaiting culture report from the clinic faxed over.  Patient stays Afebrile She  received Ceftazidime one dose, on Vancomycin and Zosyn ID team,podiatric team and Wound care team on board    LOS: 0 Lindy Pennisi 11/3/20213:04 PM

## 2020-08-04 NOTE — Consult Note (Signed)
ORTHOPAEDIC CONSULTATION  REQUESTING PHYSICIAN: Sidney Ace, MD  Chief Complaint: Right foot ulceration  HPI: [name] is a 86 y.o. female who complains of right foot ulceration with recent positive blood cultures.  Patient has recently started dialysis for end-stage renal disease.  Was instructed by dialysis center that she had a positive blood culture and sent to the hospital.  She was found to have a large wound on the dorsal aspect of her right foot.  She has been seen at the wound care center for the last couple of months with this.  It appears she is had calcium alginate with silver placed to the area 3 times a week.  She states she has a home health care nurse that assist with this.  She states that initially started as a red area with fluid that began to enlarge.  After undergoing care at the wound center she believes it is getting smaller.  It is a fairly large wound to the area.  It is very tender and sore.  She states she does have neuropathy.  Past Medical History:  Diagnosis Date  . Angina pectoris ([place]) 08/26/2012  . Arthritis   . Breast cancer ([place]) 2012   right breast  . Breast mass, right   . Cancer [facility])    breast cancer, right side 2012  . Chronic kidney disease    RENAL INSUFF  . Complication of anesthesia   . Coronary artery disease 08/22/2012   sees Dr Rockey Situ  . Diabetes ([place])   . Dyspnea    ON EXERTION  . Gastritis    hx of  . GERD (gastroesophageal reflux disease)   . Headache(784.0)    migraines  . History of breast cancer    39 treatments of radiation. Negative chemo.  Marland Kitchen History of seasonal allergies   . Hyperlipemia   . Hypertension    sees Dr. Fulton Reek  . Hypothyroidism   . Kidney disease, chronic, stage IV (GFR 15-29 ml/min) (H[facility])   . Macular degeneration    Bilateral  . Macular degeneration 2018  . Neuropathy   . Personal history of radiation therapy   . Pneumonia    hx of  . PONV (postoperative nausea and vomiting)    . 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 Stroke ([place])    2012  . Thyroid disease   . Vertigo    Past Surgical History:  Procedure Laterality Date  . ABDOMINAL HYSTERECTOMY    . APPENDECTOMY    . BACK SURGERY    . BREAST BIOPSY Right    2012 positive- [facility]  . BREAST LUMPECTOMY Right 2012   F/U radiation   . BREAST SURGERY    . CARDIAC CATHETERIZATION  2014  . CAROTID PTA/STENT INTERVENTION Left 06/14/2020   Procedure: CAROTID PTA/STENT INTERVENTION;  Surgeon: Algernon Huxley, MD;  Location: [place] [facility];  Service: Cardiovascular;  Laterality: Left;  . CATARACT EXTRACTION W/PHACO Right 08/14/2017   Procedure: CATARACT EXTRACTION PHACO AND INTRAOCULAR LENS PLACEMENT (I[facility]);  Surgeon: Birder Robson, MD;  Location: [facility];  Service: Ophthalmology;  Laterality: Right;  [place] 00:43.0 AP% 17.1 CDE 7.37 Fluid Pack lot # [id number]  . CATARACT EXTRACTION W/PHACO Left 10/09/2017   Procedure: CATARACT EXTRACTION PHACO AND INTRAOCULAR LENS PLACEMENT ([place]);  Surgeon: Birder Robson, MD;  Location: [facility];  Service: Ophthalmology;  Laterality: Left;  [place] 00:30.3  AP% 11.0 CDE 3.33 Fluid Pack Lot # [id number]  . CORONARY ARTERY BYPASS GRAFT  09/05/2012   Procedure: CORONARY ARTERY BYPASS GRAFTING (CABG);  Surgeon: Rexene Alberts, MD;  Location: [place];  Service: Open Heart Surgery;  Laterality: N/A;  CABG x four, using left internal mammary artery and bilateral greater saphenous vein harvested endoscopically  . DIALYSIS/PERMA CATHETER INSERTION Left 06/25/2020   Procedure: DIALYSIS/PERMA CATHETER INSERTION;  Surgeon: Algernon Huxley, MD;  Location: [place] [facility];  Service: Cardiovascular;  Laterality: Left;  . INTRAOPERATIVE TRANSESOPHAGEAL ECHOCARDIOGRAM  09/05/2012   Procedure: INTRAOPERATIVE TRANSESOPHAGEAL ECHOCARDIOGRAM;  Surgeon: Rexene Alberts, MD;  Location: [place];  Service: Open Heart Surgery;  Laterality: N/A;  .  KNEE SURGERY     Bilateral nerve block  . LAPAROSCOPIC NISSEN FUNDOPLICATION    . OVARIAN CYST REMOVAL     Social History   Socioeconomic History  . Marital status: Married    Spouse name: [name] "Bud"  . Number of children: 0  . Years of education: Not on file  . Highest education level: Not on file  Occupational History  . Occupation: retired  Tobacco Use  . Smoking status: Former Smoker    Types: Cigarettes    Quit date: 11/13/1988    Years since quitting: 31.7  . Smokeless tobacco: Never Used  Substance and Sexual Activity  . Alcohol use: No  . Drug use: No  . Sexual activity: Not on file  Other Topics Concern  . Not on file  Social History Narrative   Lives at home with spouse    Social Determinants of Health   Financial Resource Strain:   . Difficulty of Paying Living Expenses: Not on file  Food Insecurity:   . Worried About Charity fundraiser in the Last Year: Not on file  . Ran Out of Food in the Last Year: Not on file  Transportation Needs:   . Lack of Transportation (Medical): Not on file  . Lack of Transportation (Non-Medical): Not on file  Physical Activity:   . Days of Exercise per Week: Not on file  . Minutes of Exercise per Session: Not on file  Stress:   . Feeling of Stress : Not on file  Social Connections:   . Frequency of Communication with Friends and Family: Not on file  . Frequency of Social Gatherings with Friends and Family: Not on file  . Attends Religious Services: Not on file  . Active Member of Clubs or Organizations: Not on file  . Attends Archivist Meetings: Not on file  . Marital Status: Not on file   Family History  Problem Relation Age of Onset  . Heart disease Brother        CABG & stents  . Hyperlipidemia Brother   . Hypertension Brother   . Cancer Father   . Heart disease Mother   . Breast cancer Cousin   . Kidney disease Neg Hx    Allergies  Allergen Reactions  . Clonidine Other (See Comments) and  Shortness Of Breath    Other reaction(s): Other (see comments) Other reaction(s): Other (See Comments)  . Darvocet [Propoxyphene N-Acetaminophen] Anaphylaxis  . Hydralazine     Other reaction(s): Other (See Comments) glomerulonephritis  . Hydralazine Hcl Other (See Comments)    glomerulonephritis  . Esomeprazole     Other reaction(s): Unknown  . Guaifenesin     Other reaction(s): Unknown Other reaction(s): Unknown Other reaction(s): Unknown Other reaction(s): Unknown  . Phenylephrine-Guaifenesin  Other reaction(s): Unknown  . Rabeprazole     Other reaction(s): Unknown  . Telithromycin     Other reaction(s): Unknown Other reaction(s): Unknown Other reaction(s): Unknown  . Fluocinolone Other (See Comments)    Feels crazy Other reaction(s): Other (see comments) Other reaction(s): Other (See Comments) Feels crazy Feels crazy  . Iodinated Diagnostic Agents Nausea And Vomiting and Other (See Comments)    Burning Other reaction(s): Other (see comments) Other reaction(s): Other (See Comments) Burning Nausea & vomiting  . Levaquin [Levofloxacin] Nausea Only  . Statins Other (See Comments)    Myalgia Other reaction(s): Other (See Comments), Unknown, Unknown Myalgia   Prior to Admission medications   Medication Sig Start Date End Date Taking? Authorizing Provider  ALPRAZolam Duanne Moron) 0.5 MG tablet Take 0.5 mg by mouth 2 (two) times daily as needed for anxiety.    Yes [provider]  amLODipine (NORVASC) 5 MG tablet Take 1 tablet (5 mg total) by mouth daily. 07/03/20  Yes Nolberto Hanlon, MD  aspirin 81 MG chewable tablet Chew by mouth.   Yes [provider]  calcium-vitamin D (OSCAL WITH D) 500-200 MG-UNIT tablet Take 1 tablet by mouth daily.   Yes [provider]  cetirizine (ZYRTEC) 10 MG tablet Take 10 mg by mouth daily.   Yes [provider]  clopidogrel (PLAVIX) 75 MG tablet Take 75 mg by mouth in the morning and at bedtime.  04/29/20  04/29/21 Yes [provider]  Cyanocobalamin 1000 MCG SUBL Take 1,000 mcg by mouth daily.    Yes [provider]  gabapentin (NEURONTIN) 100 MG capsule Take 100 mg by mouth 2 (two) times daily. 09/23/12  Yes Love, Ivan Anchors, PA-C  glimepiride (AMARYL) 4 MG tablet Take 4 mg by mouth daily with breakfast.    Yes [provider]  hydrALAZINE (APRESOLINE) 25 MG tablet Take 25 mg by mouth in the morning and at bedtime.  03/02/20 03/02/21 Yes [provider]  levothyroxine (SYNTHROID, LEVOTHROID) 150 MCG tablet Take 150 mcg daily before breakfast by mouth.   Yes [provider]  metoprolol tartrate (LOPRESSOR) 50 MG tablet Take 50 mg by mouth in the morning, at noon, and at bedtime.   Yes [provider]  famotidine (PEPCID) 20 MG tablet Take 1 tablet (20 mg total) by mouth 2 (two) times daily. Patient not taking: Reported on 08/04/2020 07/02/20 07/02/21  Nolberto Hanlon, MD  HYDROcodone-acetaminophen (NORCO/VICODIN) 5-325 MG tablet Take 1 tablet by mouth every 6 (six) hours as needed for moderate pain.    [provider]  promethazine (PHENERGAN) 12.5 MG tablet Take by mouth. Patient not taking: Reported on 08/04/2020 05/09/18   [provider]  triamcinolone cream (KENALOG) 0.5 % Apply topically 2 (two) times daily. Patient not taking: Reported on 08/04/2020 02/16/20 02/15/21  [provider]   DG Chest Port 1 View  Result Date: 08/04/2020 CLINICAL DATA:  Weakness EXAM: PORTABLE CHEST 1 VIEW COMPARISON:  06/23/2020 FINDINGS: Geryl Councilman overlies the lung apices. Minimal left basilar atelectasis. No definite pneumothorax. No pleural effusion. Right internal jugular hemodialysis catheter tip overlies the superior vena cava. Coronary artery bypass grafting has been performed. Mild cardiomegaly is stable. Pulmonary vascularity is normal. IMPRESSION: Interval placement of right internal jugular hemodialysis catheter. Tip within the SVC. Stable  cardiomegaly. Electronically Signed   By: Fidela Salisbury MD   On: 08/04/2020 03:12    Positive ROS: All other systems have been reviewed and were otherwise negative with the exception of those mentioned  in the HPI and as above.  12 point ROS was performed.  Physical Exam: General: Alert and oriented.  No apparent distress.  Vascular:  Left foot:Dorsalis Pedis:  absent Posterior Tibial:  absent  Right foot: Dorsalis Pedis:  absent Posterior Tibial:  absent  She has a fair amount of edema suspect pulses are nonpalpable secondary to edema.  She does have brisk capillary fill time to the digits.  Neuro:intact gross sensation.  She is quite exquisitely tender with palpation to the dorsal aspect of the right foot along the ulcerative site.  Derm: She has a large ulceration on the dorsal aspect of her right midfoot.  It measures approximately 5 x 7 cm.  It has some mixed necrotic and granular tissue.  No active purulence.  The surrounding tissue is stable without lymphangitic streaking.  There is some serosanguineous drainage on the wound dressing that has been applied.  Ortho/MS: Diffuse +1 to +2 pitting edema to the lower extremities is noted.  Laboratory data reviewed.   White blood cell count is 8.5  Current blood cultures are pending  Right foot wound culture has been performed at the outpatient clinic growing Pseudomonas staph aureus and Enterococcus..  .   Assessment: Large dorsal right foot wound.  Etiology unclear.  Patient denies trauma to the area.  Denies obvious spider or insect bite to the area.  History of diabetes with neuropathy  Plan: This would be an abnormal diabetic neuropathic ulceration given its location.  She does have a fair amount of edema and there could be a component of venous stasis secondary to the ulcerative site.  No history of trauma.  This could be an expanding necrotic ulceration secondary to insect or spider bite but once again this is unclear  etiology.  Differential diagnosis could also be pyoderma gangrenosum.  This can be ruled out with outpatient biopsy of the tissue.  Agree with current wound care orders as placed by the wound care nurse.  At this point no need for debridement.  There is no obvious purulence or severe for debridement or I&D.  Consideration could be given for wound VAC outpatient if wound does not close and biopsy is negative.  Recommend continue follow-up with wound care center upon discharge.  Will order x-rays just for rule out at this time.    Elesa Hacker, DPM Cell [phone]   08/04/2020 5:15 PM

## 2020-08-04 NOTE — Consult Note (Signed)
NAME: [name]  DOB: January 24, 1934  MRN: [id number]  Date/Time: 08/04/2020 5:23 PM  REQUESTING PROVIDER: Dr.Sreenath Subjective:  REASON FOR CONSULT: Satph warneri bacteremia ? [name] is a 86 y.o. with a history of CKD 4/5, hypothyroid, CAD s/p cabg, carotid stenosis, CVA 04/2020, hypothyroidism was recently in [facility] between 9/21-10/1/21 for worsening renal function  Sent to the ED for a positive blood culture [name] She denies fever, chills, sweats, cough, sob, pain abdomen, diarrhea Does not know why they sent her blood but was asked to go tot ED because of positive blood cuture She says she has had a wound rt foot for more than a month- She does not know how it started- no known trauma- started as blister. 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 Severe interstitial fibrosis is noted along with severe tubular injury. Patient started on prednisone 20 mg p.o.twice daily .Serologies from September 22 showed ANA positive,p-ANCA positive, titer 1: 640, low C3, elevated kappa to lambda ratio of 34.6 Status post tunnel catheter for hemodialysis via right IJ on 9/24 by Dr. Lucky Cowboy   Past Medical History:  Diagnosis Date  . Angina pectoris ([place]) 08/26/2012  . Arthritis   . Breast cancer ([place]) 2012   right breast  . Breast mass, right   . Cancer [facility])    breast cancer, right side 2012  . Chronic kidney disease    RENAL INSUFF  . Complication of anesthesia   . Coronary artery disease 08/22/2012   sees Dr Rockey Situ  . Diabetes ([place])   . Dyspnea    ON EXERTION  . Gastritis    hx  of  . GERD (gastroesophageal reflux disease)   . Headache(784.0)    migraines  . History of breast cancer    39 treatments of radiation. Negative chemo.  Marland Kitchen History of seasonal allergies   . Hyperlipemia   . Hypertension    sees Dr. Fulton Reek  . Hypothyroidism   . Kidney disease, chronic, stage IV (GFR 15-29 ml/min) (HCC)   . Macular degeneration    Bilateral  . Macular degeneration 2018  . Neuropathy   . Personal history of radiation therapy   . Pneumonia    hx of  . PONV (postoperative nausea and vomiting)   . 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 Stroke ([place])    2012  . Thyroid disease   . Vertigo     Past Surgical History:  Procedure Laterality Date  . ABDOMINAL HYSTERECTOMY    . APPENDECTOMY    . BACK SURGERY    . BREAST BIOPSY Right    2012 positive- [facility]  . BREAST LUMPECTOMY Right 2012   F/U radiation   . BREAST SURGERY    . CARDIAC CATHETERIZATION  2014  . CAROTID PTA/STENT INTERVENTION Left 06/14/2020   Procedure: CAROTID PTA/STENT INTERVENTION;  Surgeon: Algernon Huxley, MD;  Location: [place] CV LAB;  Service: Cardiovascular;  Laterality: Left;  . CATARACT EXTRACTION W/PHACO Right 08/14/2017   Procedure: CATARACT EXTRACTION PHACO AND INTRAOCULAR LENS  PLACEMENT (IOC);  Surgeon: Birder Robson, MD;  Location: [facility] ORS;  Service: Ophthalmology;  Laterality: Right;  [place] 00:43.0 AP% 17.1 CDE 7.37 Fluid Pack lot # [id number]  . CATARACT EXTRACTION W/PHACO Left 10/09/2017   Procedure: CATARACT EXTRACTION PHACO AND INTRAOCULAR LENS PLACEMENT ([place]);  Surgeon: Birder Robson, MD;  Location: [facility] ORS;  Service: Ophthalmology;  Laterality: Left;  [place] 00:30.3 AP% 11.0 CDE 3.33 Fluid Pack Lot # [id number]  . CORONARY ARTERY BYPASS GRAFT  09/05/2012   Procedure: CORONARY ARTERY BYPASS GRAFTING (CABG);  Surgeon: Rexene Alberts, MD;  Location: [place];  Service: Open Heart Surgery;  Laterality: N/A;  CABG  x four, using left internal mammary artery and bilateral greater saphenous vein harvested endoscopically  . DIALYSIS/PERMA CATHETER INSERTION Left 06/25/2020   Procedure: DIALYSIS/PERMA CATHETER INSERTION;  Surgeon: Algernon Huxley, MD;  Location: [place] CV LAB;  Service: Cardiovascular;  Laterality: Left;  . INTRAOPERATIVE TRANSESOPHAGEAL ECHOCARDIOGRAM  09/05/2012   Procedure: INTRAOPERATIVE TRANSESOPHAGEAL ECHOCARDIOGRAM;  Surgeon: Rexene Alberts, MD;  Location: [place];  Service: Open Heart Surgery;  Laterality: N/A;  . KNEE SURGERY     Bilateral nerve block  . LAPAROSCOPIC NISSEN FUNDOPLICATION    . OVARIAN CYST REMOVAL      Social History   Socioeconomic History  . Marital status: Married    Spouse name: Sarah Banister "Bud"  . Number of children: 0  . Years of education: Not on file  . Highest education level: Not on file  Occupational History  . Occupation: retired  Tobacco Use  . Smoking status: Former Smoker    Types: Cigarettes    Quit date: 11/13/1988    Years since quitting: 31.7  . Smokeless tobacco: Never Used  Substance and Sexual Activity  . Alcohol use: No  . Drug use: No  . Sexual activity: Not on file  Other Topics Concern  . Not on file  Social History Narrative   Lives at home with spouse    Social Determinants of Health   Financial Resource Strain:   . Difficulty of Paying Living Expenses: Not on file  Food Insecurity:   . Worried About Charity fundraiser in the Last Year: Not on file  . Ran Out of Food in the Last Year: Not on file  Transportation Needs:   . Lack of Transportation (Medical): Not on file  . Lack of Transportation (Non-Medical): Not on file  Physical Activity:   . Days of Exercise per Week: Not on file  . Minutes of Exercise per Session: Not on file  Stress:   . Feeling of Stress : Not on file  Social Connections:   . Frequency of Communication with Friends and Family: Not on file  . Frequency of Social Gatherings with Friends and  Family: Not on file  . Attends Religious Services: Not on file  . Active Member of Clubs or Organizations: Not on file  . Attends Archivist Meetings: Not on file  . Marital Status: Not on file  Intimate Partner Violence:   . Fear of Current or Ex-Partner: Not on file  . Emotionally Abused: Not on file  . Physically Abused: Not on file  . Sexually Abused: Not on file    Family History  Problem Relation Age of Onset  . Heart disease Brother        CABG & stents  . Hyperlipidemia Brother   . Hypertension Brother   . Cancer Father   . Heart disease Mother   .  Breast cancer Cousin   . Kidney disease Neg Hx    Allergies  Allergen Reactions  . Clonidine Other (See Comments) and Shortness Of Breath    Other reaction(s): Other (see comments) Other reaction(s): Other (See Comments)  . Darvocet [Propoxyphene N-Acetaminophen] Anaphylaxis  . Hydralazine     Other reaction(s): Other (See Comments) glomerulonephritis  . Hydralazine Hcl Other (See Comments)    glomerulonephritis  . Esomeprazole     Other reaction(s): Unknown  . Guaifenesin     Other reaction(s): Unknown Other reaction(s): Unknown Other reaction(s): Unknown Other reaction(s): Unknown  . Phenylephrine-Guaifenesin     Other reaction(s): Unknown  . Rabeprazole     Other reaction(s): Unknown  . Telithromycin     Other reaction(s): Unknown Other reaction(s): Unknown Other reaction(s): Unknown  . Fluocinolone Other (See Comments)    Feels crazy Other reaction(s): Other (see comments) Other reaction(s): Other (See Comments) Feels crazy Feels crazy  . Iodinated Diagnostic Agents Nausea And Vomiting and Other (See Comments)    Burning Other reaction(s): Other (see comments) Other reaction(s): Other (See Comments) Burning Nausea & vomiting  . Levaquin [Levofloxacin] Nausea Only  . Statins Other (See Comments)    Myalgia Other reaction(s): Other (See Comments), Unknown, Unknown Myalgia    ? Current  Facility-Administered Medications  Medication Dose Route Frequency Provider Last Rate Last Admin  . amLODipine (NORVASC) tablet 5 mg  5 mg Oral Daily Athena Masse, MD   5 mg at 08/04/20 2542  . clopidogrel (PLAVIX) tablet 75 mg  75 mg Oral Daily Athena Masse, MD   75 mg at 08/04/20 7062  . dextrose 5 %-0.9 % sodium chloride infusion   Intravenous Continuous Ralene Muskrat B, MD 40 mL/hr at 08/04/20 0819 New Bag at 08/04/20 0819  . dextrose 50 % solution           . gabapentin (NEURONTIN) capsule 200 mg  200 mg Oral BID Ralene Muskrat B, MD   200 mg at 08/04/20 1527  . heparin injection 5,000 Units  5,000 Units Subcutaneous Q8H Athena Masse, MD   5,000 Units at 08/04/20 1432  . insulin aspart (novoLOG) injection 0-5 Units  0-5 Units Subcutaneous QHS Judd Gaudier V, MD      . insulin aspart (novoLOG) injection 0-9 Units  0-9 Units Subcutaneous TID WC Judd Gaudier V, MD      . levothyroxine (SYNTHROID) tablet 150 mcg  150 mcg Oral Q0600 Athena Masse, MD      . loperamide (IMODIUM) capsule 2 mg  2 mg Oral PRN Ralene Muskrat B, MD   2 mg at 08/04/20 1431  . metoprolol tartrate (LOPRESSOR) tablet 50 mg  50 mg Oral BID Athena Masse, MD   50 mg at 08/04/20 3762  . oxyCODONE (Oxy IR/ROXICODONE) immediate release tablet 5 mg  5 mg Oral Q4H PRN Ralene Muskrat B, MD   5 mg at 08/04/20 1527  . piperacillin-tazobactam (ZOSYN) IVPB 2.25 g  2.25 g Intravenous Q8H Dallie Piles, RPH 100 mL/hr at 08/04/20 1438 2.25 g at 08/04/20 1438  . [START ON 08/05/2020] vancomycin (VANCOCIN) IVPB 1000 mg/200 mL premix  1,000 mg Intravenous Q T,Th,Sa-HD Athena Masse, MD         Abtx:  Anti-infectives (From admission, onward)   Start     Dose/Rate Route Frequency Ordered Stop   08/05/20 1800  cefTAZidime (FORTAZ) 1 g in sodium chloride 0.9 % 100 mL IVPB  Status:  Discontinued  Note to Pharmacy: Give after dialysis sessions on HD days.    1 g 200 mL/hr over 30 Minutes Intravenous Every  24 hours 08/04/20 0607 08/04/20 1052   08/05/20 1200  vancomycin (VANCOCIN) IVPB 1000 mg/200 mL premix        1,000 mg 200 mL/hr over 60 Minutes Intravenous Every T-Th-Sa (Hemodialysis) 08/04/20 0612     08/04/20 1400  piperacillin-tazobactam (ZOSYN) IVPB 2.25 g        2.25 g 100 mL/hr over 30 Minutes Intravenous Every 8 hours 08/04/20 1214     08/04/20 0330  vancomycin (VANCOREADY) IVPB 1500 mg/300 mL        1,500 mg 150 mL/hr over 120 Minutes Intravenous  Once 08/04/20 0326 08/04/20 0622   08/04/20 0315  cefTAZidime (FORTAZ) 1 g in sodium chloride 0.9 % 100 mL IVPB        1 g 200 mL/hr over 30 Minutes Intravenous  Once 08/04/20 0300 08/04/20 0438   08/04/20 0315  vancomycin (VANCOCIN) IVPB 1000 mg/200 mL premix  Status:  Discontinued        1,000 mg 200 mL/hr over 60 Minutes Intravenous  Once 08/04/20 0300 08/04/20 0326      REVIEW OF SYSTEMS:  Const: negative fever, negative chills, negative weight loss Eyes: negative diplopia or visual changes, negative eye pain ENT: negative coryza, negative sore throat Resp: negative cough, hemoptysis, dyspnea Cards: negative for chest pain, palpitations, lower extremity edema GU: negative for frequency, dysuria and hematuria GI: Negative for abdominal pain, diarrhea, bleeding, constipation Skin: rt foot wound Heme: negative for easy bruising and gum/nose bleeding NG:[id number] weakness Neurolo:negative for headaches, dizziness, vertigo, memory problems  Psych: negative for feelings of anxiety, depression  Endocrine: negative for thyroid, diabetes Allergy/Immunology- multiple allergies Objective:  VITALS:  BP (!) 140/59 (BP Location: Left Arm)   Pulse 90   Temp 98.4 F (36.9 C) (Oral)   Resp 20   Ht 5\' 4"  (1.626 m)   Wt 92.4 kg   SpO2 97%   BMI 34.97 kg/m  PHYSICAL EXAM:  General: Alert, cooperative, no distress, appears stated age.  Head: Normocephalic, without obvious abnormality, atraumatic. Eyes: Conjunctivae clear, anicteric  sclerae. Pupils are equal ENT Nares normal. No drainage or sinus tenderness. Lips, mucosa, and tongue normal. No Thrush Neck: Supple, symmetrical, no adenopathy, thyroid: non tender no carotid bruit and no JVD. Back: No CVA tenderness. Lungs: Clear to auscultation bilaterally. No Wheezing or Rhonchi. No rales. Heart: Regular rate and rhythm, no murmur, rub or gallop. Rt IJ dialysis catheter Abdomen: Soft, non-tender,not distended. Bowel sounds normal. No masses Extremities: Dorsum rt foot wound     atraumatic, no cyanosis. No edema. No clubbing Skin: No rashes or lesions. Or bruising Lymph: Cervical, supraclavicular normal. Neurologic: Grossly non-focal Pertinent Labs Lab Results CBC    Component Value Date/Time   WBC 8.5 08/04/2020 0714   RBC 2.88 (L) 08/04/2020 0714   HGB 9.0 (L) 08/04/2020 0714   HGB 12.7 05/07/2014 0516   HCT 28.4 (L) 08/04/2020 0714   HCT 37.2 05/07/2014 0516   PLT 132 (L) 08/04/2020 0714   PLT 224 05/07/2014 0516   MCV 98.6 08/04/2020 0714   MCV 86 05/07/2014 0516   MCH 31.3 08/04/2020 0714   MCHC 31.7 08/04/2020 0714   RDW 18.3 (H) 08/04/2020 0714   RDW 14.1 05/07/2014 0516   LYMPHSABS 0.5 (L) 07/02/2020 0611   LYMPHSABS 2.3 05/07/2014 0516   MONOABS 0.5 07/02/2020 0611   MONOABS 1.1 (H) 05/07/2014 3244  EOSABS 0.0 07/02/2020 0611   EOSABS 0.0 05/07/2014 0516   BASOSABS 0.0 07/02/2020 0611   BASOSABS 0.0 05/07/2014 0516    CMP Latest Ref Rng & Units 08/04/2020 08/03/2020 07/06/2020  Glucose 70 - 99 mg/dL - 272(H) 145(H)  BUN 8 - 23 mg/dL - 37(H) 20  Creatinine 0.44 - 1.00 mg/dL 3.33(H) 2.82(H) 2.81(H)  Sodium 135 - 145 mmol/L - 133(L) 135  Potassium 3.5 - 5.1 mmol/L - 4.7 3.3(L)  Chloride 98 - 111 mmol/L - 96(L) 98  CO2 22 - 32 mmol/L - 26 26  Calcium 8.9 - 10.3 mg/dL - 6.7(L) 7.3(L)  Total Protein 6.5 - 8.1 g/dL - 6.5 -  Total Bilirubin 0.3 - 1.2 mg/dL - 0.8 -  Alkaline Phos 38 - 126 U/L - 68 -  AST 15 - 41 U/L - 12(L) -  ALT 0 - 44  U/L - 7 -      Microbiology: Recent Results (from the past 240 hour(s))  Culture, blood (routine x 2)     Status: None (Preliminary result)   Collection Time: 08/04/20  3:09 AM   Specimen: BLOOD  Result Value Ref Range Status   Specimen Description BLOOD BLOOD LEFT ARM  Final   Special Requests   Final    BOTTLES DRAWN AEROBIC AND ANAEROBIC Blood Culture results may not be optimal due to an inadequate volume of blood received in culture bottles   Culture   Final    NO GROWTH < 12 HOURS Performed at [facility], [address], [place], [place] [address]    Report Status PENDING  Incomplete  Culture, blood (routine x 2)     Status: None (Preliminary result)   Collection Time: 08/04/20  3:09 AM   Specimen: BLOOD  Result Value Ref Range Status   Specimen Description BLOOD LEFT ANTECUBITAL  Final   Special Requests   Final    BOTTLES DRAWN AEROBIC AND ANAEROBIC Blood Culture results may not be optimal due to an inadequate volume of blood received in culture bottles   Culture   Final    NO GROWTH < 12 HOURS Performed at [facility], [address] [place], [place] [address]    Report Status PENDING  Incomplete  Respiratory Panel by RT PCR (Flu A&B, Covid) - Nasopharyngeal Swab     Status: None   Collection Time: 08/04/20  3:09 AM   Specimen: Nasopharyngeal Swab  Result Value Ref Range Status   SARS Coronavirus 2 by RT PCR NEGATIVE NEGATIVE Final    Comment: (NOTE) SARS-CoV-2 target nucleic acids are NOT DETECTED.  The SARS-CoV-2 RNA is generally detectable in upper respiratoy specimens during the acute phase of infection. The lowest concentration of SARS-CoV-2 viral copies this assay can detect is 131 copies/mL. A negative result does not preclude SARS-Cov-2 infection and should not be used as the sole basis for treatment or other patient management decisions. A negative result may occur with  improper specimen collection/handling, submission of specimen  other than nasopharyngeal swab, presence of viral mutation(s) within the areas targeted by this assay, and inadequate number of viral copies (<131 copies/mL). A negative result must be combined with clinical observations, patient history, and epidemiological information. The expected result is Negative.  Fact Sheet for Patients:  PinkCheek.be  Fact Sheet for Healthcare Providers:  GravelBags.it  This test is no t yet approved or cleared by the [place] FDA and  has been authorized for detection and/or diagnosis of SARS-CoV-2 by FDA under  an Emergency Use Authorization (EUA). This EUA will remain  in effect (meaning this test can be used) for the duration of the COVID-19 declaration under Section 564(b)(1) of the Act, 21 U.S.C. section 360bbb-3(b)(1), unless the authorization is terminated or revoked sooner.     Influenza A by PCR NEGATIVE NEGATIVE Final   Influenza B by PCR NEGATIVE NEGATIVE Final    Comment: (NOTE) The Xpert Xpress SARS-CoV-2/FLU/RSV assay is intended as an aid in  the diagnosis of influenza from Nasopharyngeal swab specimens and  should not be used as a sole basis for treatment. Nasal washings and  aspirates are unacceptable for Xpert Xpress SARS-CoV-2/FLU/RSV  testing.  Fact Sheet for Patients: PinkCheek.be  Fact Sheet for Healthcare Providers: GravelBags.it  This test is not yet approved or cleared by the [place] FDA and  has been authorized for detection and/or diagnosis of SARS-CoV-2 by  FDA under an Emergency Use Authorization (EUA). This EUA will remain  in effect (meaning this test can be used) for the duration of the  Covid-19 declaration under Section 564(b)(1) of the Act, 21  U.S.C. section 360bbb-3(b)(1), unless the authorization is  terminated or revoked. Performed at [facility], [address]  [place], [place] [address]     IMAGING RESULTS: I have personally reviewed the films ? Impression/Recommendation Staph warneri bacteremia in a patient with ESRD - has a dialysis catheter- This organism is resistant to oxacillin- Source could be the dialysis catheter which likely will have to be removed Continue vanco 2 d echo Repeat culture   Rt foot wound- on dorsum of the foot- unclear etiology Okay to continue zosyn ? ?CAD s/p CABG ? _Anemia due to ESRD __________________________________________________ Discussed with patient, requesting provider Note:  This document was prepared using Dragon voice recognition software and may include unintentional dictation errors.

## 2020-08-04 NOTE — Progress Notes (Signed)
PHARMACY NOTE:  ANTIMICROBIAL RENAL DOSAGE ADJUSTMENT  Current antimicrobial regimen includes a mismatch between antimicrobial dosage and estimated renal function.  As per policy approved by the Pharmacy & Therapeutics and Medical Executive Committees, the antimicrobial dosage will be adjusted accordingly.  Current antimicrobial dosage:  Ceftazidime 1 gm IV Q8H   Indication:  Wound infection   Renal Function:  Estimated Creatinine Clearance: 15.9 mL/min (A) (by C-G formula based on SCr of 2.82 mg/dL (H)). [x]      On intermittent HD, scheduled:  T-Th-Sat  []      On CRRT    Antimicrobial dosage has been changed to:   Ceftazidime 1 gm IV Q24H to be given after HD on dialysis.   Additional comments:   Thank you for allowing pharmacy to be a part of this patient's care.  Edinson Domeier D, [facility] 08/04/2020 6:22 AM

## 2020-08-04 NOTE — ED Provider Notes (Signed)
[facility] Emergency Department Provider Note   ____________________________________________   First MD Initiated Contact with Patient 08/04/20 0216     (approximate)  I have reviewed the triage vital signs and the nursing notes.   HISTORY  Chief Complaint Abnormal Lab    HPI [name] is a 86 y.o. female sent to the ED by her nephrologist for admission for bacteremia.  Patient has a history of ESRD on HD T/TH/SAT, hypertension, type 2 diabetes who had partial dialysis yesterday.  Dialysis stopped early due to patient not feeling well.  States she had 2 episodes of diarrhea.  More recently she was seen on 07/28/2020 in the wound clinic; she was sent urgently by her PCP for evaluation of bilateral foot wounds.  Wound cultures grew out Pseudomonas, Enterococcus, and staph aureus.  She was placed on cefdinir 300 mg after dialysis on Tuesday/Thursday/Saturday.  The wound doctor coordinated with her nephrologist for patient to have vancomycin and ceftazidime during dialysis.  Patient complains of generalized malaise.  Denies fever, cough, chest pain, shortness of breath, abdominal pain, nausea or vomiting.     Past Medical History:  Diagnosis Date  . Angina pectoris ([place]) 08/26/2012  . Arthritis   . Breast cancer ([place]) 2012   right breast  . Breast mass, right   . Cancer [facility])    breast cancer, right side 2012  . Chronic kidney disease    RENAL INSUFF  . Complication of anesthesia   . Coronary artery disease 08/22/2012   sees Dr Rockey Situ  . Diabetes ([place])   . Dyspnea    ON EXERTION  . Gastritis    hx of  . GERD (gastroesophageal reflux disease)   . Headache(784.0)    migraines  . History of breast cancer    39 treatments of radiation. Negative chemo.  Marland Kitchen History of seasonal allergies   . Hyperlipemia   . Hypertension    sees Dr. Fulton Reek  . Hypothyroidism   . Kidney disease, chronic, stage IV (GFR 15-29 ml/min) (HCC)   . Macular  degeneration    Bilateral  . Macular degeneration 2018  . Neuropathy   . Personal history of radiation therapy   . Pneumonia    hx of  . PONV (postoperative nausea and vomiting)   . 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 Stroke ([place])    2012  . Thyroid disease   . Vertigo     Patient Active Problem List   Diagnosis Date Noted  . Wound infection 08/04/2020  . ESRD on dialysis ([place]) 07/16/2020  . Symptomatic anemia 07/01/2020  . Hyperkalemia 06/22/2020  . Prolonged QT interval 06/22/2020  . Stroke ([place]) 06/01/2020  . CAD (coronary artery disease) 06/01/2020  . Thyroid disease 06/01/2020  . CKD (chronic kidney disease) stage 5, GFR less than 15 ml/min (HCC) 06/01/2020  . Carotid stenosis 06/01/2020  . Pain syndrome, chronic 02/16/2020  . Weakness 08/30/2016  . Acute renal failure (ARF) ([place]) 05/04/2016  . Type 2 diabetes with nephropathy ([place]) 09/02/2015  . HTN, goal below 140/80 08/20/2015  . Diabetes mellitus type 2, uncomplicated ([place]) 47/65/4650  . Cerebral embolism with cerebral infarction ([place]) 09/07/2012  . Hemiplegia, unspecified, affecting dominant side 09/07/2012  . 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 Coronary artery disease 08/22/2012  . Hyperlipidemia 08/19/2012  . Diabetes mellitus ([place]) 08/19/2012  . Hypertension 08/19/2012  .  Breast cancer ([place]) 08/19/2012  . GERD (gastroesophageal reflux disease) 08/19/2012    Past Surgical History:  Procedure Laterality Date  . ABDOMINAL HYSTERECTOMY    . APPENDECTOMY    . BACK SURGERY    . BREAST BIOPSY Right    2012 positive- [facility]  . BREAST LUMPECTOMY Right 2012   F/U radiation   . BREAST SURGERY    . CARDIAC CATHETERIZATION  2014  . CAROTID PTA/STENT INTERVENTION Left 06/14/2020   Procedure: CAROTID PTA/STENT INTERVENTION;  Surgeon: Algernon Huxley, MD;  Location: [place] [facility];  Service: Cardiovascular;  Laterality: Left;  . CATARACT EXTRACTION  W/PHACO Right 08/14/2017   Procedure: CATARACT EXTRACTION PHACO AND INTRAOCULAR LENS PLACEMENT (IOC);  Surgeon: Birder Robson, MD;  Location: [facility];  Service: Ophthalmology;  Laterality: Right;  [place] 00:43.0 AP% 17.1 CDE 7.37 Fluid Pack lot # [id number]  . CATARACT EXTRACTION W/PHACO Left 10/09/2017   Procedure: CATARACT EXTRACTION PHACO AND INTRAOCULAR LENS PLACEMENT ([place]);  Surgeon: Birder Robson, MD;  Location: [facility];  Service: Ophthalmology;  Laterality: Left;  [place] 00:30.3 AP% 11.0 CDE 3.33 Fluid Pack Lot # [id number]  . CORONARY ARTERY BYPASS GRAFT  09/05/2012   Procedure: CORONARY ARTERY BYPASS GRAFTING (CABG);  Surgeon: Rexene Alberts, MD;  Location: [place];  Service: Open Heart Surgery;  Laterality: N/A;  CABG x four, using left internal mammary artery and bilateral greater saphenous vein harvested endoscopically  . DIALYSIS/PERMA CATHETER INSERTION Left 06/25/2020   Procedure: DIALYSIS/PERMA CATHETER INSERTION;  Surgeon: Algernon Huxley, MD;  Location: [facility] [facility];  Service: Cardiovascular;  Laterality: Left;  . INTRAOPERATIVE TRANSESOPHAGEAL ECHOCARDIOGRAM  09/05/2012   Procedure: INTRAOPERATIVE TRANSESOPHAGEAL ECHOCARDIOGRAM;  Surgeon: Rexene Alberts, MD;  Location: [place];  Service: Open Heart Surgery;  Laterality: N/A;  . KNEE SURGERY     Bilateral nerve block  . LAPAROSCOPIC NISSEN FUNDOPLICATION    . OVARIAN CYST REMOVAL      Prior to Admission medications   Medication Sig Start Date End Date Taking? Authorizing Provider  ALPRAZolam Duanne Moron) 0.5 MG tablet Take 0.5 mg by mouth 2 (two) times daily as needed for anxiety.    Yes [provider]  amLODipine (NORVASC) 5 MG tablet Take 1 tablet (5 mg total) by mouth daily. 07/03/20  Yes Nolberto Hanlon, MD  aspirin 81 MG chewable tablet Chew by mouth.   Yes [provider]  calcium-vitamin D (OSCAL WITH D) 500-200 MG-UNIT tablet Take 1 tablet by mouth daily.   Yes [provider]  cetirizine (ZYRTEC)  10 MG tablet Take 10 mg by mouth daily.   Yes [provider]  clopidogrel (PLAVIX) 75 MG tablet Take 75 mg by mouth in the morning and at bedtime.  04/29/20 04/29/21 Yes [provider]  Cyanocobalamin 1000 MCG SUBL Take 1,000 mcg by mouth daily.    Yes [provider]  gabapentin (NEURONTIN) 100 MG capsule Take 100 mg by mouth 2 (two) times daily. 09/23/12  Yes Love, Ivan Anchors, PA-C  glimepiride (AMARYL) 4 MG tablet Take 4 mg by mouth daily with breakfast.    Yes [provider]  hydrALAZINE (APRESOLINE) 25 MG tablet Take 25 mg by mouth in the morning and at bedtime.  03/02/20 03/02/21 Yes [provider]  levothyroxine (SYNTHROID, LEVOTHROID) 150 MCG tablet Take 150 mcg daily before breakfast by mouth.   Yes [provider]  metoprolol tartrate (LOPRESSOR) 50 MG tablet Take 50 mg by mouth in the morning, at noon, and at  bedtime.   Yes [provider]  famotidine (PEPCID) 20 MG tablet Take 1 tablet (20 mg total) by mouth 2 (two) times daily. Patient not taking: Reported on 08/04/2020 07/02/20 07/02/21  Nolberto Hanlon, MD  HYDROcodone-acetaminophen (NORCO/VICODIN) 5-325 MG tablet Take 1 tablet by mouth every 6 (six) hours as needed for moderate pain.    [provider]  promethazine (PHENERGAN) 12.5 MG tablet Take by mouth. Patient not taking: Reported on 08/04/2020 05/09/18   [provider]  triamcinolone cream (KENALOG) 0.5 % Apply topically 2 (two) times daily. Patient not taking: Reported on 08/04/2020 02/16/20 02/15/21  [provider]    Allergies Clonidine, Darvocet [propoxyphene n-acetaminophen], Hydralazine, Hydralazine hcl, Esomeprazole, Guaifenesin, Phenylephrine-guaifenesin, Rabeprazole, Telithromycin, Fluocinolone, Iodinated diagnostic agents, Levaquin [levofloxacin], and Statins  Family History  Problem Relation Age of Onset  . Heart disease Brother        CABG & stents  . Hyperlipidemia Brother   .  Hypertension Brother   . Cancer Father   . Heart disease Mother   . Breast cancer Cousin   . Kidney disease Neg Hx     Social History Social History   Tobacco Use  . Smoking status: Former Smoker    Types: Cigarettes    Quit date: 11/13/1988    Years since quitting: 31.7  . Smokeless tobacco: Never Used  Substance Use Topics  . Alcohol use: No  . Drug use: No    Review of Systems  Constitutional: Positive for generalized malaise.  No fever/chills Eyes: No visual changes. ENT: No sore throat. Cardiovascular: Denies chest pain. Respiratory: Denies shortness of breath. Gastrointestinal: No abdominal pain.  No nausea, no vomiting.  No diarrhea.  No constipation. Genitourinary: Negative for dysuria. Musculoskeletal: Negative for back pain. Skin: Negative for rash. Neurological: Negative for headaches, focal weakness or numbness.   ____________________________________________   PHYSICAL EXAM:  VITAL SIGNS: ED Triage Vitals  Enc Vitals Group     BP 08/03/20 1553 (!) 135/58     Pulse Rate 08/03/20 1553 89     Resp 08/03/20 1553 18     Temp 08/03/20 1553 98.6 F (37 C)     Temp Source 08/03/20 1553 Oral     SpO2 08/03/20 1553 96 %     Weight 08/03/20 1554 186 lb (84.4 kg)     Height 08/03/20 1554 5\' 4"  (1.626 m)     Head Circumference --      Peak Flow --      Pain Score 08/03/20 1554 0     Pain Loc --      Pain Edu? --      Excl. in [place]? --     Constitutional: Alert and oriented.  Chronically ill appearing and in no acute distress. Eyes: Conjunctivae are normal. PERRL. EOMI. Head: Atraumatic. Nose: No congestion/rhinnorhea. Mouth/Throat: Mucous membranes are mildly dry.   Neck: No stridor.   Cardiovascular: Normal rate, regular rhythm. Grossly normal heart sounds.  Good peripheral circulation. Respiratory: Normal respiratory effort.  No retractions. Lungs CTAB. Gastrointestinal: Soft and nontender. No distention. No abdominal bruits. No CVA  tenderness. Musculoskeletal: BLE wrapped in compressive dressings.  No joint effusions. Neurologic:  Normal speech and language. No gross focal neurologic deficits are appreciated.  Skin:  Skin is warm, dry and intact. No rash noted. Psychiatric: Mood and affect are normal. Speech and behavior are normal.  ____________________________________________   LABS (all labs ordered are listed, but only abnormal results are displayed)  Labs Reviewed  COMPREHENSIVE  METABOLIC PANEL - Abnormal; Notable for the following components:      Result Value   Sodium 133 (*)    Chloride 96 (*)    Glucose, Bld 272 (*)    BUN 37 (*)    Creatinine, Ser 2.82 (*)    Calcium 6.7 (*)    Albumin 2.9 (*)    AST 12 (*)    GFR, Estimated 16 (*)    All other components within normal limits  CBC - Abnormal; Notable for the following components:   RBC 2.78 (*)    Hemoglobin 8.6 (*)    HCT 27.0 (*)    RDW 17.8 (*)    All other components within normal limits  CULTURE, BLOOD (ROUTINE X 2)  CULTURE, BLOOD (ROUTINE X 2)  RESPIRATORY PANEL BY RT PCR (FLU A&B, COVID)  LIPASE, BLOOD  LACTIC ACID, PLASMA  LACTIC ACID, PLASMA  PROCALCITONIN   ____________________________________________  EKG  ED ECG REPORT I, Platon Arocho J, the attending physician, personally viewed and interpreted this ECG.   Date: 08/04/2020  EKG Time: 0415  Rate: 63  Rhythm: normal EKG, normal sinus rhythm  Axis: Normal  Intervals:none  ST&T Change: Nonspecific  ____________________________________________  RADIOLOGY I, Cobain Morici J, personally viewed and evaluated these images (plain radiographs) as part of my medical decision making, as well as reviewing the written report by the radiologist.  ED MD interpretation: Mild cardiomegaly; no acute cardiopulmonary process  Official radiology report(s): DG Chest Port 1 View  Result Date: 08/04/2020 CLINICAL DATA:  Weakness EXAM: PORTABLE CHEST 1 VIEW COMPARISON:  06/23/2020 FINDINGS:  Geryl Councilman overlies the lung apices. Minimal left basilar atelectasis. No definite pneumothorax. No pleural effusion. Right internal jugular hemodialysis catheter tip overlies the superior vena cava. Coronary artery bypass grafting has been performed. Mild cardiomegaly is stable. Pulmonary vascularity is normal. IMPRESSION: Interval placement of right internal jugular hemodialysis catheter. Tip within the SVC. Stable cardiomegaly. Electronically Signed   By: Fidela Salisbury MD   On: 08/04/2020 03:12    ____________________________________________   PROCEDURES  Procedure(s) performed (including Critical Care):  .1-3 Lead EKG Interpretation Performed by: Paulette Blanch, MD Authorized by: Paulette Blanch, MD     Interpretation: normal     ECG rate:  80   ECG rate assessment: normal     Rhythm: sinus rhythm     Ectopy: none     Conduction: normal     ____________________________________________   INITIAL IMPRESSION / ASSESSMENT AND PLAN / ED COURSE  As part of my medical decision making, I reviewed the following data within the [facility] notes reviewed and incorporated, Labs reviewed, EKG interpreted, Old chart reviewed (10/27 wound clinic office visit, 10/28 telephone call regarding home health, 10/29 orders only but unable to see lab results), Radiograph reviewed, Discussed with admitting physician and Notes from prior ED visits     86 year old female on dialysis with active BLE wound infections referred to the ED by nephrologist for admission for bacteremia.  Laboratory results demonstrate stable H/H and renal function.  Will obtain blood cultures, lactic acid, procalcitonin and Covid swab.  I have personally scoured the patient's chart and unfortunately am unable to see results of any blood cultures.  I suspect these were done through the dialysis clinic.  Patient does not come with any paperwork, there was no prior communication from patient's physician.  I do see that  she was being given vancomycin and ceftazidime during dialysis and will start these broad-spectrum IV  antibiotics.  Patient's nephrologist Dr. Holley Raring is not on-call tonight though I am unable to contact him to obtain additional information.  Hopefully additional information can be obtained during business hours.  Will discuss with hospitalist services for admission.      ____________________________________________   FINAL CLINICAL IMPRESSION(S) / ED DIAGNOSES  Final diagnoses:  Bacteremia  ESRD (end stage renal disease) on dialysis ([place])  [place]     ED Discharge Orders    None      *Please note:  [name] was evaluated in Emergency Department on 08/04/2020 for the symptoms described in the history of present illness. She was evaluated in the context of the global COVID-19 pandemic, which necessitated consideration that the patient might be at risk for infection with the SARS-CoV-2 virus that causes COVID-19. Institutional protocols and algorithms that pertain to the evaluation of patients at risk for COVID-19 are in a state of rapid change based on information released by regulatory bodies including the CDC and federal and state organizations. These policies and algorithms were followed during the patient's care in the ED.  Some ED evaluations and interventions may be delayed as a result of limited staffing during and the pandemic.*   Note:  This document was prepared using Dragon voice recognition software and may include unintentional dictation errors.   Paulette Blanch, MD 08/04/20 [phone]

## 2020-08-04 NOTE — Progress Notes (Signed)
PHARMACY -  BRIEF ANTIBIOTIC NOTE   Pharmacy has received consult(s) for Ceftazidime, Vancomycin from an ED provider.  The patient's profile has been reviewed for ht/wt/allergies/indication/available labs.    One time order(s) placed for Ceftazidime 1 gm IV X 1 and Vancomycin 1500 mg IV X 1.   Further antibiotics/pharmacy consults should be ordered by admitting physician if indicated.                       Thank you, Ameira Alessandrini D 08/04/2020  3:27 AM

## 2020-08-04 NOTE — Progress Notes (Signed)
Pharmacy Antibiotic Note  [name] is a 86 y.o. female admitted on 08/04/2020 with cellulitis to dorsal aspect of right foot followed at the wound care clinic .  Pharmacy was consulted for vancomycin and Zosyn dosing.  Pt is on HD every T-Th-Sat. She has a h/o cellulitis with wound cultures containing multiple organisms  Plan:  1) continue vancomycin 1 gm IV Q T-Th-Sat ordered to start on 11/4  Labs as clinically indicated  2) start Zosyn 2.25 grams IV every 8 hours   Height: 5\' 4"  (162.6 cm) Weight: 92.4 kg (203 lb 11.3 oz) IBW/kg (Calculated) : 54.7  Temp (24hrs), Avg:97.8 F (36.6 C), Min:97.4 F (36.3 C), Max:98.6 F (37 C)  Recent Labs  Lab 08/03/20 1557 08/04/20 0309 08/04/20 0714  WBC 8.8  --  8.5  CREATININE 2.82*  --  3.33*  LATICACIDVEN  --  1.2  --     Estimated Creatinine Clearance: 14.1 mL/min (A) (by C-G formula based on SCr of 3.33 mg/dL (H)).    Allergies  Allergen Reactions  . Clonidine Other (See Comments) and Shortness Of Breath    Other reaction(s): Other (see comments) Other reaction(s): Other (See Comments)  . Darvocet [Propoxyphene N-Acetaminophen] Anaphylaxis  . Hydralazine     Other reaction(s): Other (See Comments) glomerulonephritis  . Hydralazine Hcl Other (See Comments)    glomerulonephritis  . Esomeprazole     Other reaction(s): Unknown  . Guaifenesin     Other reaction(s): Unknown Other reaction(s): Unknown Other reaction(s): Unknown Other reaction(s): Unknown  . Phenylephrine-Guaifenesin     Other reaction(s): Unknown  . Rabeprazole     Other reaction(s): Unknown  . Telithromycin     Other reaction(s): Unknown Other reaction(s): Unknown Other reaction(s): Unknown  . Fluocinolone Other (See Comments)    Feels crazy Other reaction(s): Other (see comments) Other reaction(s): Other (See Comments) Feels crazy Feels crazy  . Iodinated Diagnostic Agents Nausea And Vomiting and Other (See Comments)    Burning Other  reaction(s): Other (see comments) Other reaction(s): Other (See Comments) Burning Nausea & vomiting  . Levaquin [Levofloxacin] Nausea Only  . Statins Other (See Comments)    Myalgia Other reaction(s): Other (See Comments), Unknown, Unknown Myalgia    Antimicrobials this admission: ceftazidime x 1 vancomycin 11/3 >>  Zosyn 11/3  >>   Microbiology results: 11/3 BCx: NG < 12 hours  10/21 Pseudomonas, E faecalis, S aureus 11/3 SARS CoV-2: negative 11/3 influenza A/B: negative    Thank you for allowing pharmacy to be a part of this patient's care.  Dallie Piles 08/04/2020 10:53 AM

## 2020-08-04 NOTE — Progress Notes (Signed)
Pt arrived to 228 via stretcher on RA. Pt able to open eyes to voice and touch but falls back to sleep. Pt not very talkative at this moment. Pt skin very cold/mottled. Pt clothes changed to hospital clothes. Upon quick skin assessment, pt noted to have nonblanchable redness to inner butt cheeks with open areas. Large bruise to L thigh noted. Scattered body bruising. Large dressings to Bil lower extremities. Pitting edema +4 noted bil feet with weeping noted. Warm blankets provided. Rt IJ HD catheter noted with dressing CDI. Pt now currently sleeping/snoring. WOCN order placed. Vitals taken. Safety measures in place. Will continue to monitor.

## 2020-08-04 NOTE — Consult Note (Signed)
Junior Nurse Consult Note: Reason for Consult:Bilateral dry boot for compression to lower legs. Nonhealing venous wound to right dorsal foot.  Seen at wound care center.  Changed twice weekly.  Wound type: chronic nonhealing.  Pressure Injury POA: NA Measurement: 7 cm x 5 cm x 3 cm  Wound bed:20% dark necrotic  80% ruddy red Drainage (amount, consistency, odor) moderate serosanguinous  Musty odor.  Periwound:generalized edema to lower legs and feet.  Dressing procedure/placement/frequency: Cleanse bilateral lower legs with soap and water and pat dry. Apply algiante dressing wound bed. Cover with dry gauze.  Wrap both legs with kerlix from below toes to below knee.  Secure with self adherent coban. Change twice weekly.  Will follow. Domenic Moras MSN, RN, FNP-BC CWON Wound, Ostomy, Continence Nurse Pager [phone]

## 2020-08-04 NOTE — Progress Notes (Signed)
Pharmacy Antibiotic Note  [name] is a 86 y.o. female admitted on 08/04/2020 with wound infection.  Pharmacy has been consulted for Vancomycin dosing.  Pt is on HD every T-Th-Sat.   Plan: Vancomycin 1500 mg IV X 1 given in ED on 11/3 @ 0400.  Vancomycin 1 gm IV Q T-Th-Sat ordered to start on 11/4.  No trough currently ordered.   Height: 5\' 4"  (162.6 cm) Weight: 84.4 kg (186 lb) IBW/kg (Calculated) : 54.7  Temp (24hrs), Avg:97.9 F (36.6 C), Min:97.4 F (36.3 C), Max:98.6 F (37 C)  Recent Labs  Lab 08/03/20 1557 08/04/20 0309  WBC 8.8  --   CREATININE 2.82*  --   LATICACIDVEN  --  1.2    Estimated Creatinine Clearance: 15.9 mL/min (A) (by C-G formula based on SCr of 2.82 mg/dL (H)).    Allergies  Allergen Reactions  . Clonidine Other (See Comments) and Shortness Of Breath    Other reaction(s): Other (see comments) Other reaction(s): Other (See Comments)  . Darvocet [Propoxyphene N-Acetaminophen] Anaphylaxis  . Hydralazine     Other reaction(s): Other (See Comments) glomerulonephritis  . Hydralazine Hcl Other (See Comments)    glomerulonephritis  . Esomeprazole     Other reaction(s): Unknown  . Guaifenesin     Other reaction(s): Unknown Other reaction(s): Unknown Other reaction(s): Unknown Other reaction(s): Unknown  . Phenylephrine-Guaifenesin     Other reaction(s): Unknown  . Rabeprazole     Other reaction(s): Unknown  . Telithromycin     Other reaction(s): Unknown Other reaction(s): Unknown Other reaction(s): Unknown  . Fluocinolone Other (See Comments)    Feels crazy Other reaction(s): Other (see comments) Other reaction(s): Other (See Comments) Feels crazy Feels crazy  . Iodinated Diagnostic Agents Nausea And Vomiting and Other (See Comments)    Burning Other reaction(s): Other (see comments) Other reaction(s): Other (See Comments) Burning Nausea & vomiting  . Levaquin [Levofloxacin] Nausea Only  . Statins Other (See Comments)     Myalgia Other reaction(s): Other (See Comments), Unknown, Unknown Myalgia    Antimicrobials this admission:   >>    >>   Dose adjustments this admission:   Microbiology results:  BCx:   UCx:    Sputum:    MRSA PCR:   Thank you for allowing pharmacy to be a part of this patient's care.  Aala Ransom D 08/04/2020 6:12 AM

## 2020-08-04 NOTE — H&P (Addendum)
History and Physical    [name] CBU:[id number] DOB: 06-May-1934 DOA: 08/04/2020  PCP: Idelle Crouch, MD   Patient coming from: home  I have personally briefly reviewed patient's old medical records in [place]  Chief Complaint: Abnormal labs, sent by nephrologist  HPI: [name] is a 86 y.o. 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 Holley Raring due to concern for bloodstream infection per patient's report.  Blood culture results not available on Care Everywhere.  Patient states that she feels in her usual state of health.  She denies weakness beyond her baseline.  Denies malaise or feeling unwell.  Denies loss of appetite.  Has no fever or chills, no cough or shortness of breath, no nausea vomiting or abdominal pain and denies change in bowel habits or dysuria. ED course: On arrival vitals were within normal limits.  White cell count normal at 8800.  Hemoglobin 8.6 which is at baseline.  CMP mostly unremarkable except for mild hypokalemia of 6.7 but otherwise as to be expected for her ESRD on dialysis.  Covid and flu negative.  Chest x-ray with no acute findings.  Right IJ hemodialysis catheter in place EKG as interpreted by me: Sinus rhythm at rate of 63 with no acute ST-T wave changes Patient given a dose of vancomycin and ceftazidime.  Hospitalist consulted for admission.    Review of Systems: As per HPI otherwise all other systems on review of systems negative.    Past Medical History:  Diagnosis Date   Angina pectoris ([place]) 08/26/2012   Arthritis    Breast  cancer ([place]) 2012   right breast   Breast mass, right    Cancer ([place])    breast cancer, right side 2012   Chronic kidney disease    RENAL INSUFF   Complication of anesthesia    Coronary artery disease 08/22/2012   sees Dr Rockey Situ   Diabetes [facility])    Dyspnea    ON EXERTION   Gastritis    hx of   GERD (gastroesophageal reflux disease)    Headache(784.0)    migraines   History of breast cancer    39 treatments of radiation. Negative chemo.   History of seasonal allergies    Hyperlipemia    Hypertension    sees Dr. Fulton Reek   Hypothyroidism    Kidney disease, chronic, stage IV (GFR 15-29 ml/min) (HCC)    Macular degeneration    Bilateral   Macular degeneration 2018   Neuropathy    Personal history of radiation therapy    Pneumonia    hx of   PONV (postoperative nausea and vomiting)    S/P CABG x 4 09/05/2012   LIMA to LAD, SVG to Diag, SVG to OM1, SVG to PDA, EVH from bilateral thighs   Stroke [facility])    2012   Thyroid disease    Vertigo     Past Surgical History:  Procedure Laterality Date   ABDOMINAL HYSTERECTOMY     APPENDECTOMY     BACK SURGERY     BREAST BIOPSY Right    2012 positive- [facility]  BREAST LUMPECTOMY Right 2012   F/U radiation    BREAST SURGERY     CARDIAC CATHETERIZATION  2014   CAROTID PTA/STENT INTERVENTION Left 06/14/2020   Procedure: CAROTID PTA/STENT INTERVENTION;  Surgeon: Algernon Huxley, MD;  Location: [place] CV LAB;  Service: Cardiovascular;  Laterality: Left;   CATARACT EXTRACTION W/PHACO Right 08/14/2017   Procedure: CATARACT EXTRACTION PHACO AND INTRAOCULAR LENS PLACEMENT (IOC);  Surgeon: Birder Robson, MD;  Location: [facility] ORS;  Service: Ophthalmology;  Laterality: Right;  [place] 00:43.0 AP% 17.1 CDE 7.37 Fluid Pack lot # [id number]   CATARACT EXTRACTION W/PHACO Left 10/09/2017   Procedure: CATARACT EXTRACTION PHACO AND INTRAOCULAR LENS PLACEMENT (IOC);  Surgeon: Birder Robson, MD;  Location:  [facility] ORS;  Service: Ophthalmology;  Laterality: Left;  [place] 00:30.3 AP% 11.0 CDE 3.33 Fluid Pack Lot # [id number]   CORONARY ARTERY BYPASS GRAFT  09/05/2012   Procedure: CORONARY ARTERY BYPASS GRAFTING (CABG);  Surgeon: Rexene Alberts, MD;  Location: [place];  Service: Open Heart Surgery;  Laterality: N/A;  CABG x four, using left internal mammary artery and bilateral greater saphenous vein harvested endoscopically   DIALYSIS/PERMA CATHETER INSERTION Left 06/25/2020   Procedure: DIALYSIS/PERMA CATHETER INSERTION;  Surgeon: Algernon Huxley, MD;  Location: [place] CV LAB;  Service: Cardiovascular;  Laterality: Left;   INTRAOPERATIVE TRANSESOPHAGEAL ECHOCARDIOGRAM  09/05/2012   Procedure: INTRAOPERATIVE TRANSESOPHAGEAL ECHOCARDIOGRAM;  Surgeon: Rexene Alberts, MD;  Location: [place];  Service: Open Heart Surgery;  Laterality: N/A;   KNEE SURGERY     Bilateral nerve block   LAPAROSCOPIC NISSEN FUNDOPLICATION     OVARIAN CYST REMOVAL       reports that she quit smoking about 31 years ago. Her smoking use included cigarettes. She has never used smokeless tobacco. She reports that she does not drink alcohol and does not use drugs.  Allergies  Allergen Reactions   Clonidine Other (See Comments) and Shortness Of Breath    Other reaction(s): Other (see comments) Other reaction(s): Other (See Comments)   Darvocet [Propoxyphene N-Acetaminophen] Anaphylaxis   Hydralazine     Other reaction(s): Other (See Comments) glomerulonephritis   Hydralazine Hcl Other (See Comments)    glomerulonephritis   Esomeprazole     Other reaction(s): Unknown   Guaifenesin     Other reaction(s): Unknown Other reaction(s): Unknown Other reaction(s): Unknown Other reaction(s): Unknown   Phenylephrine-Guaifenesin     Other reaction(s): Unknown   Rabeprazole     Other reaction(s): Unknown   Telithromycin     Other reaction(s): Unknown Other reaction(s): Unknown Other reaction(s): Unknown    Fluocinolone Other (See Comments)    Feels crazy Other reaction(s): Other (see comments) Other reaction(s): Other (See Comments) Feels crazy Feels crazy   Iodinated Diagnostic Agents Nausea And Vomiting and Other (See Comments)    Burning Other reaction(s): Other (see comments) Other reaction(s): Other (See Comments) Burning Nausea & vomiting   Levaquin [Levofloxacin] Nausea Only   Statins Other (See Comments)    Myalgia Other reaction(s): Other (See Comments), Unknown, Unknown Myalgia    Family History  Problem Relation Age of Onset   Heart disease Brother        CABG & stents   Hyperlipidemia Brother    Hypertension Brother    Cancer Father    Heart disease Mother    Breast cancer Cousin    Kidney disease Neg Hx       Prior to Admission medications   Medication Sig Start Date End Date Taking? Authorizing Provider  ALPRAZolam (XANAX) 0.5 MG tablet Take 0.5 mg by mouth 2 (two) times daily as needed for anxiety.    Yes [provider]  amLODipine (NORVASC) 5 MG tablet Take 1 tablet (5 mg total) by mouth daily. 07/03/20  Yes Nolberto Hanlon, MD  aspirin 81 MG chewable tablet Chew by mouth.   Yes [provider]  calcium-vitamin D (OSCAL WITH D) 500-200 MG-UNIT tablet Take 1 tablet by mouth daily.   Yes [provider]  cetirizine (ZYRTEC) 10 MG tablet Take 10 mg by mouth daily.   Yes [provider]  clopidogrel (PLAVIX) 75 MG tablet Take 75 mg by mouth in the morning and at bedtime.  04/29/20 04/29/21 Yes [provider]  Cyanocobalamin 1000 MCG SUBL Take 1,000 mcg by mouth daily.    Yes [provider]  gabapentin (NEURONTIN) 100 MG capsule Take 100 mg by mouth 2 (two) times daily. 09/23/12  Yes Love, Ivan Anchors, PA-C  glimepiride (AMARYL) 4 MG tablet Take 4 mg by mouth daily with breakfast.    Yes [provider]  hydrALAZINE (APRESOLINE) 25 MG tablet Take 25 mg by mouth in the morning and at bedtime.  03/02/20  03/02/21 Yes [provider]  levothyroxine (SYNTHROID, LEVOTHROID) 150 MCG tablet Take 150 mcg daily before breakfast by mouth.   Yes [provider]  metoprolol tartrate (LOPRESSOR) 50 MG tablet Take 50 mg by mouth in the morning, at noon, and at bedtime.   Yes [provider]  famotidine (PEPCID) 20 MG tablet Take 1 tablet (20 mg total) by mouth 2 (two) times daily. Patient not taking: Reported on 08/04/2020 07/02/20 07/02/21  Nolberto Hanlon, MD  HYDROcodone-acetaminophen (NORCO/VICODIN) 5-325 MG tablet Take 1 tablet by mouth every 6 (six) hours as needed for moderate pain.    [provider]  promethazine (PHENERGAN) 12.5 MG tablet Take by mouth. Patient not taking: Reported on 08/04/2020 05/09/18   [provider]  triamcinolone cream (KENALOG) 0.5 % Apply topically 2 (two) times daily. Patient not taking: Reported on 08/04/2020 02/16/20 02/15/21  [provider]    Physical Exam: Vitals:   08/04/20 0327 08/04/20 0330 08/04/20 0400 08/04/20 0407  BP:  (!) 107/56 (!) 106/47   Pulse: 72   64  Resp:      Temp:      TempSrc:      SpO2: 95%   92%  Weight:      Height:         Vitals:   08/04/20 0327 08/04/20 0330 08/04/20 0400 08/04/20 0407  BP:  (!) 107/56 (!) 106/47   Pulse: 72   64  Resp:      Temp:      TempSrc:      SpO2: 95%   92%  Weight:      Height:          Constitutional: Alert and oriented x 3 . Not in any apparent distress HEENT:      Head: Normocephalic and atraumatic.         Eyes: PERLA, EOMI, Conjunctivae are normal. Sclera is non-icteric.       Mouth/Throat: Mucous membranes are moist.       Neck: Supple with no signs of meningismus. Cardiovascular: Regular rate and rhythm. No murmurs, gallops, or rubs. 2+ symmetrical distal pulses are present . No JVD.  Bilateral LE edema, right greater than left Respiratory: Respiratory effort normal .Lungs sounds clear bilaterally. No wheezes, crackles, or rhonchi.   Gastrointestinal:  Soft, non tender, and non distended with positive bowel sounds. No rebound or guarding. Genitourinary: No CVA tenderness. Musculoskeletal: Nontender with normal range of motion in all extremities.  Bilateral lower extremities wrapped in bandages from dorsum of feet up to knees Neurologic:  Face is symmetric. Moving all extremities.  Equal strength upper extremities, mildly decreased strength right lower extremity skin: Skin is warm, dry.  Lower legs wrapped in dressings.  Dressing on right leg with serosanguineous fluid Psychiatric: Mood and affect are normal    Labs on Admission: I have personally reviewed following labs and imaging studies  CBC: Recent Labs  Lab 08/03/20 1557  WBC 8.8  HGB 8.6*  HCT 27.0*  MCV 97.1  PLT 440   Basic Metabolic Panel: Recent Labs  Lab 08/03/20 1557  NA 133*  K 4.7  CL 96*  CO2 26  GLUCOSE 272*  BUN 37*  CREATININE 2.82*  CALCIUM 6.7*   GFR: Estimated Creatinine Clearance: 15.9 mL/min (A) (by C-G formula based on SCr of 2.82 mg/dL (H)). Liver Function Tests: Recent Labs  Lab 08/03/20 1557  AST 12*  ALT 7  ALKPHOS 68  BILITOT 0.8  PROT 6.5  ALBUMIN 2.9*   Recent Labs  Lab 08/03/20 1557  LIPASE 34   No results for input(s): AMMONIA in the last 168 hours. Coagulation Profile: No results for input(s): INR, PROTIME in the last 168 hours. Cardiac Enzymes: No results for input(s): CKTOTAL, CKMB, CKMBINDEX, TROPONINI in the last 168 hours. BNP (last 3 results) No results for input(s): PROBNP in the last 8760 hours. HbA1C: No results for input(s): HGBA1C in the last 72 hours. CBG: No results for input(s): GLUCAP in the last 168 hours. Lipid Profile: No results for input(s): CHOL, HDL, LDLCALC, TRIG, CHOLHDL, LDLDIRECT in the last 72 hours. Thyroid Function Tests: No results for input(s): TSH, T4TOTAL, FREET4, T3FREE, THYROIDAB in the last 72 hours. Anemia Panel: No results for input(s): VITAMINB12, FOLATE,  FERRITIN, TIBC, IRON, RETICCTPCT in the last 72 hours. Urine analysis:    Component Value Date/Time   COLORURINE RED (A) 06/23/2020 1025   APPEARANCEUR HAZY (A) 06/23/2020 1025   APPEARANCEUR Clear 05/03/2014 0503   LABSPEC 1.007 06/23/2020 1025   LABSPEC 1.015 05/03/2014 0503   PHURINE  06/23/2020 1025    TEST NOT REPORTED DUE TO COLOR INTERFERENCE OF URINE PIGMENT   GLUCOSEU (A) 06/23/2020 1025    TEST NOT REPORTED DUE TO COLOR INTERFERENCE OF URINE PIGMENT   GLUCOSEU Negative 05/03/2014 0503   HGBUR (A) 06/23/2020 1025    TEST NOT REPORTED DUE TO COLOR INTERFERENCE OF URINE PIGMENT   BILIRUBINUR (A) 06/23/2020 1025    TEST NOT REPORTED DUE TO COLOR INTERFERENCE OF URINE PIGMENT   BILIRUBINUR Negative 05/03/2014 0503   KETONESUR (A) 06/23/2020 1025    TEST NOT REPORTED DUE TO COLOR INTERFERENCE OF URINE PIGMENT   PROTEINUR (A) 06/23/2020 1025    TEST NOT REPORTED DUE TO COLOR INTERFERENCE OF URINE PIGMENT   UROBILINOGEN 0.2 09/03/2012 1355   NITRITE (A) 06/23/2020 1025    TEST NOT REPORTED DUE TO COLOR INTERFERENCE OF URINE PIGMENT   LEUKOCYTESUR (A) 06/23/2020 1025    TEST NOT REPORTED DUE TO COLOR INTERFERENCE OF URINE PIGMENT   LEUKOCYTESUR Negative 05/03/2014 0503    Radiological Exams on Admission: DG Chest Port 1 View  Result Date: 08/04/2020 CLINICAL DATA:  Weakness EXAM: PORTABLE CHEST 1 VIEW COMPARISON:  06/23/2020 FINDINGS: Geryl Councilman overlies the lung apices. Minimal left basilar atelectasis. No definite  pneumothorax. No pleural effusion. Right internal jugular hemodialysis catheter tip overlies the superior vena cava. Coronary artery bypass grafting has been performed. Mild cardiomegaly is stable. Pulmonary vascularity is normal. IMPRESSION: Interval placement of right internal jugular hemodialysis catheter. Tip within the SVC. Stable cardiomegaly. 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 Holley Raring due to concern for bloodstream infection per patient's report.  Blood culture results not available on Care Everywhere.      Wound infection -Wound culture from 10/15 grew Pseudomonas aeruginosa, Enterococcus faecalis and staph aureus -Continue vancomycin and ceftazidime on dialysis days as well as cefdinir for outpatient -Podiatry consult while hospitalized for continued management-patient had debridement on 10/28 -Wound care -Keep legs elevated    Hypertension -Stable -Continue amlodipine, metoprolol    GERD (gastroesophageal reflux disease) -Chronic and stable.  Continue famotidine    S/P CABG x 4   CAD (coronary artery disease) -Chronic and stable with no complaints of chest pain -Continue aspirin, Plavix, metoprolol.  Statin not on med list  Hypothyroidism -Continue levothyroxine    Type 2 diabetes with nephropathy (HCC) -Sliding scale insulin    ESRD on hemodialysis [facility]) -Nephrology consult for continuation of dialysis    History of CVA with residual right lower extremity weakness   Ambulatory dysfunction -Continue.  Not currently on statin per med list -Patient ambulate with a cane    Anemia of chronic kidney failure, stage 5 (HCC) -Chronic and stable    DVT prophylaxis: Heparin Code Status: full code  Family Communication:  none  Disposition Plan: Back to previous home environment Consults called: Nephrology Status:At the time of admission, it appears that the appropriate admission status for this patient is INPATIENT. This is judged to be reasonable and  necessary in order to provide the required intensity of service to ensure the patient's safety given the presenting symptoms, physical exam findings, and initial radiographic and laboratory data in the context of their  Comorbid conditions.   Patient requires inpatient status due to high intensity of service, high risk for further deterioration and high frequency of surveillance required.   I certify that at the point of admission it is my clinical judgment that the patient will require inpatient hospital care spanning beyond [place] MD [facility]     08/04/2020, 5:11 AM

## 2020-08-04 NOTE — Progress Notes (Signed)
Brief hospitalist update note This is a nonbillable note.  Please see same-day H&P for full billable details  Briefly, this is a 86 year old patient with history significant for ESRD on HD TTS, CAD status post CABG, CVA with residual right lower extremity weakness, type II DM who presented to the emergency department at the request of her nephrologist given concern for gram-positive bacteremia.  Results of outpatient blood culture were sent to the inpatient nephrology consultant Dr. Juleen China.  We will attempt to have these uploaded into care everywhere.  Preliminary results from blood cultures growing Staphylococcus warneri.  Concerning resistance pattern but is sensitive to vancomycin which she is on  Regarding her foot wound wound and ostomy has evaluated in place dressing.  I have taken a picture and included in the medical record.  I have requested consultation from Dr. Vickki Muff of podiatry.  Will defer any further imaging after surgical evaluation.  Continue broad-spectrum antibiotics for now.  Once we are able to have culture results uploaded into care everywhere anticipate infectious disease involvement.  Ralene Muskrat MD

## 2020-08-04 NOTE — Progress Notes (Signed)
Mobility Specialist - Progress Note   08/04/20 1200  Mobility  Range of Motion/Exercises All extremities (slr, hip add/abd, quad sets, arm raises)  Level of Assistance Minimal assist, patient does 75% or more  Assistive Device None  Distance Ambulated (ft) 0 ft  Mobility Response Tolerated well  Mobility performed by Mobility specialist  $Mobility charge 1 Mobility    Pre-mobility: 85 HR, 93% SpO2 Post-mobility: 86 HR, 97% SpO2   Pt was lying in bed with husband in room upon arrival. Pt agreed to session. Pt denied any pain, nausea, or fatigue this date. Pt declined EOB or OOB activity d/t "bowels moving." Pt stated that she has not tolerated much activity since admission d/t "just not wanting to be here". Mobility educated pt on the importance of mobilizing. Pt was able to perform supine exercises: straight leg raises, arm raises, quad sets, hip abd/add with minA. Pt was instructed to continue exercises in between sessions and nodded head in understanding. Overall, pt tolerated session well. Pt was left in bed with all needs in reach.    Kathee Delton Mobility Specialist 08/04/20, 12:29 PM

## 2020-08-05 ENCOUNTER — Encounter
Admission: EM | Disposition: A | Discharge status: Home / Self Care | Payer: Medicare Other | Source: Home / Self Care | Attending: Internal Medicine

## 2020-08-05 ENCOUNTER — Inpatient Hospital Stay (HOSPITAL_COMMUNITY)
Admit: 2020-08-05 | Discharge: 2020-08-05 | Disposition: A | Discharge status: Home / Self Care | Payer: Medicare Other | Attending: Internal Medicine | Admitting: Internal Medicine

## 2020-08-05 DIAGNOSIS — R7881 Bacteremia: Secondary | ICD-10-CM | POA: Diagnosis not present

## 2020-08-05 DIAGNOSIS — T148XXA Other injury of unspecified body region, initial encounter: Secondary | ICD-10-CM | POA: Diagnosis not present

## 2020-08-05 DIAGNOSIS — N186 End stage renal disease: Secondary | ICD-10-CM | POA: Diagnosis not present

## 2020-08-05 DIAGNOSIS — B958 Unspecified staphylococcus as the cause of diseases classified elsewhere: Secondary | ICD-10-CM | POA: Diagnosis not present

## 2020-08-05 DIAGNOSIS — S91301A Unspecified open wound, right foot, initial encounter: Secondary | ICD-10-CM

## 2020-08-05 DIAGNOSIS — L089 Local infection of the skin and subcutaneous tissue, unspecified: Secondary | ICD-10-CM | POA: Diagnosis not present

## 2020-08-05 DIAGNOSIS — L8990 Pressure ulcer of unspecified site, unspecified stage: Secondary | ICD-10-CM | POA: Insufficient documentation

## 2020-08-05 HISTORY — PX: DIALYSIS/PERMA CATHETER REMOVAL: CATH118289

## 2020-08-05 LAB — HEPATITIS PANEL, ACUTE
HCV Ab: NONREACTIVE
Hep A IgM: NONREACTIVE
Hep B C IgM: NONREACTIVE
Hepatitis B Surface Ag: NONREACTIVE

## 2020-08-05 LAB — RENAL FUNCTION PANEL
Albumin: 2.2 g/dL — ABNORMAL LOW (ref 3.5–5.0)
Anion gap: 11 (ref 5–15)
BUN: 54 mg/dL — ABNORMAL HIGH (ref 8–23)
CO2: 24 mmol/L (ref 22–32)
Calcium: 6.3 mg/dL — CL (ref 8.9–10.3)
Chloride: 99 mmol/L (ref 98–111)
Creatinine, Ser: 3.95 mg/dL — ABNORMAL HIGH (ref 0.44–1.00)
GFR, Estimated: 11 mL/min — ABNORMAL LOW (ref >60)
Glucose, Bld: 90 mg/dL (ref 70–99)
Phosphorus: 4.3 mg/dL (ref 2.5–4.6)
Potassium: 4.1 mmol/L (ref 3.5–5.1)
Sodium: 134 mmol/L — ABNORMAL LOW (ref 135–145)

## 2020-08-05 LAB — GLUCOSE, CAPILLARY
Glucose-Capillary: 147 mg/dL — ABNORMAL HIGH (ref 70–99)
Glucose-Capillary: 69 mg/dL — ABNORMAL LOW (ref 70–99)
Glucose-Capillary: 83 mg/dL (ref 70–99)
Glucose-Capillary: 86 mg/dL (ref 70–99)
Glucose-Capillary: 87 mg/dL (ref 70–99)
Glucose-Capillary: 87 mg/dL (ref 70–99)
Glucose-Capillary: 93 mg/dL (ref 70–99)

## 2020-08-05 LAB — CBC WITH DIFFERENTIAL/PLATELET
Abs Immature Granulocytes: 0.03 10*3/uL (ref 0.00–0.07)
Basophils Absolute: 0 10*3/uL (ref 0.0–0.1)
Basophils Relative: 0 %
Eosinophils Absolute: 0.1 10*3/uL (ref 0.0–0.5)
Eosinophils Relative: 2 %
HCT: 25 % — ABNORMAL LOW (ref 36.0–46.0)
Hemoglobin: 7.8 g/dL — ABNORMAL LOW (ref 12.0–15.0)
Immature Granulocytes: 0 %
Lymphocytes Relative: 22 %
Lymphs Abs: 1.5 10*3/uL (ref 0.7–4.0)
MCH: 30.5 pg (ref 26.0–34.0)
MCHC: 31.2 g/dL (ref 30.0–36.0)
MCV: 97.7 fL (ref 80.0–100.0)
Monocytes Absolute: 0.4 10*3/uL (ref 0.1–1.0)
Monocytes Relative: 6 %
Neutro Abs: 4.8 10*3/uL (ref 1.7–7.7)
Neutrophils Relative %: 70 %
Platelets: 116 10*3/uL — ABNORMAL LOW (ref 150–400)
RBC: 2.56 MIL/uL — ABNORMAL LOW (ref 3.87–5.11)
RDW: 18.1 % — ABNORMAL HIGH (ref 11.5–15.5)
WBC: 6.9 10*3/uL (ref 4.0–10.5)
nRBC: 0 % (ref 0.0–0.2)

## 2020-08-05 LAB — BASIC METABOLIC PANEL
Anion gap: 11 (ref 5–15)
BUN: 57 mg/dL — ABNORMAL HIGH (ref 8–23)
CO2: 23 mmol/L (ref 22–32)
Calcium: 6.4 mg/dL — CL (ref 8.9–10.3)
Chloride: 100 mmol/L (ref 98–111)
Creatinine, Ser: 3.89 mg/dL — ABNORMAL HIGH (ref 0.44–1.00)
GFR, Estimated: 11 mL/min — ABNORMAL LOW (ref >60)
Glucose, Bld: 97 mg/dL (ref 70–99)
Potassium: 4.1 mmol/L (ref 3.5–5.1)
Sodium: 134 mmol/L — ABNORMAL LOW (ref 135–145)

## 2020-08-05 LAB — HEPATITIS B SURFACE ANTIGEN: Hepatitis B Surface Ag: NONREACTIVE

## 2020-08-05 SURGERY — DIALYSIS/PERMA CATHETER REMOVAL
Anesthesia: LOCAL

## 2020-08-05 MED ORDER — TORSEMIDE 100 MG PO TABS
100.0000 mg | ORAL_TABLET | Freq: Every day | ORAL | Status: DC
  Administered 2020-08-07 – 2020-08-11 (×4): 100 mg via ORAL
  Filled 2020-08-05 (×8): qty 1

## 2020-08-05 MED ORDER — ONDANSETRON HCL 4 MG/2ML IJ SOLN
4.0000 mg | Freq: Four times a day (QID) | INTRAMUSCULAR | Status: DC | PRN
  Administered 2020-08-05: 4 mg via INTRAVENOUS
  Filled 2020-08-05: qty 2

## 2020-08-05 MED ORDER — LOPERAMIDE HCL 2 MG PO CAPS
4.0000 mg | ORAL_CAPSULE | ORAL | Status: DC | PRN
  Administered 2020-08-05: 4 mg via ORAL
  Filled 2020-08-05: qty 2

## 2020-08-05 MED ORDER — DEXTROSE IN LACTATED RINGERS 5 % IV SOLN: INTRAVENOUS | Status: DC

## 2020-08-05 MED ORDER — PNEUMOCOCCAL VAC POLYVALENT 25 MCG/0.5ML IJ INJ: 0.5000 mL | INJECTION | INTRAMUSCULAR | Status: DC

## 2020-08-05 MED ORDER — DIPHENOXYLATE-ATROPINE 2.5-0.025 MG PO TABS: 1.0000 | ORAL_TABLET | Freq: Four times a day (QID) | ORAL | Status: DC | PRN

## 2020-08-05 MED ORDER — CALCIUM CARBONATE ANTACID 500 MG PO CHEW
2.0000 | CHEWABLE_TABLET | Freq: Two times a day (BID) | ORAL | Status: DC
  Administered 2020-08-05 – 2020-08-11 (×12): 400 mg via ORAL
  Filled 2020-08-05 (×12): qty 2

## 2020-08-05 MED ORDER — DIPHENOXYLATE-ATROPINE 2.5-0.025 MG/5ML PO LIQD: 5.0000 mL | Freq: Four times a day (QID) | ORAL | Status: DC | PRN

## 2020-08-05 SURGICAL SUPPLY — 2 items
FORCEPS HALSTEAD CVD 5IN STRL (INSTRUMENTS) ×2 IMPLANT
TRAY LACERAT/PLASTIC (MISCELLANEOUS) ×2 IMPLANT

## 2020-08-05 NOTE — Progress Notes (Signed)
[name]  Subjective: Asked to evaluate by nephrology. In the setting of bacteremia vascular surgery has been consulted to remove the patient's PermCath.   Objective: Vitals:   08/05/20 0819 08/05/20 1000 08/05/20 1555 08/05/20 1645  BP: 113/63 (!) 141/66 (!) 96/47 (!) 117/40  Pulse: 93  89 90  Resp:   18 20  Temp: 98.8 F (37.1 C) 99.7 F (37.6 C) 98.4 F (36.9 C)   TempSrc: Oral Oral Oral   SpO2: 95% 100% 96% 96%  Weight:      Height:        Intake/Output Summary (Last 24 hours) at 08/05/2020 1654 Last data filed at 08/05/2020 1330 Gross per 24 hour  Intake 480 ml  Output 1500 ml  Net -1020 ml   Physical Exam: A&Ox3, NAD Right-sided PermCath without purulence CV: RRR Pulmonary: CTA Bilaterally Abdomen: Soft, Nontender, Nondistended Vascular: Warm distally to toes    Laboratory: CBC    Component Value Date/Time   WBC 6.9 08/05/2020 0817   HGB 7.8 (L) 08/05/2020 0817   HGB 12.7 05/07/2014 0516   HCT 25.0 (L) 08/05/2020 0817   HCT 37.2 05/07/2014 0516   PLT 116 (L) 08/05/2020 0817   PLT 224 05/07/2014 0516   BMET    Component Value Date/Time   NA 134 (L) 08/05/2020 0817   NA 134 (L) 08/05/2020 0817   NA 140 11/22/2015 0830   NA 142 05/07/2014 0516   K 4.1 08/05/2020 0817   K 4.1 08/05/2020 0817   K 3.2 (L) 05/07/2014 0516   CL 100 08/05/2020 0817   CL 99 08/05/2020 0817   CL 104 05/07/2014 0516   CO2 23 08/05/2020 0817   CO2 24 08/05/2020 0817   CO2 29 05/07/2014 0516   GLUCOSE 97 08/05/2020 0817   GLUCOSE 90 08/05/2020 0817   GLUCOSE 64 (L) 05/07/2014 0516   BUN 57 (H) 08/05/2020 0817   BUN 54 (H) 08/05/2020 0817   BUN 35 (H) 11/22/2015 0830   BUN 26 (H) 05/07/2014 0516   CREATININE 3.89 (H) 08/05/2020 0817   CREATININE 3.95 (H) 08/05/2020 0817   CREATININE 1.13 05/07/2014 0516   CALCIUM 6.4 (LL) 08/05/2020 0817   CALCIUM 6.3 (LL) 08/05/2020 0817   CALCIUM 7.9 (L) 05/07/2014 0516   GFRNONAA 11 (L)  08/05/2020 0817   GFRNONAA 11 (L) 08/05/2020 0817   GFRNONAA 47 (L) 05/07/2014 0516   GFRAA 6 (L) 06/30/2020 0611   GFRAA 54 (L) 05/07/2014 0516   Assessment/Planning: The patient is an 86 year old female with end-stage renal disease in the setting of bacteremia  1) vascular surgery consulted by nephrology to remove PermCath in the setting of bacteremia. Procedure, risks and benefits were explained to the patient. All questions were answered. Patient wishes to proceed.  Discussed with Dr. Ellis Parents Sarah Servidio PA-C 08/05/2020 4:54 PM

## 2020-08-05 NOTE — Progress Notes (Signed)
Mobility Specialist - Progress Note   08/05/20 1000  Mobility  Activity Contraindicated/medical hold  Mobility performed by Mobility specialist    Pt currently undergoing HD treatment. Will attempt session at another date/time.   Kathee Delton Mobility Specialist 08/05/20, 10:36 AM

## 2020-08-05 NOTE — Progress Notes (Signed)
Hypoglycemic Event  CBG: 69  Treatment: 15 gram carbohydrate and Ensure w/ protein  Symptoms: None  Follow-up CBG: Time: 0032 CBG Result:86  Possible Reasons for Event: Unknown Comments/MD notified: Ladell Heads notified and Ensure given. Fluids increased to 50 mL/hr.    Ariella Voit

## 2020-08-05 NOTE — Progress Notes (Signed)
*  PRELIMINARY RESULTS* Echocardiogram 2D Echocardiogram has been performed.  [name] 08/05/2020, 9:56 AM

## 2020-08-05 NOTE — Care Management (Signed)
Patient with his risk score for readmission.  TOC to complete consult. Patient currently in HD.  PT/OT pending

## 2020-08-05 NOTE — Progress Notes (Signed)
PT Cancellation Note  Patient Details Name: [name] MRN: [id number] DOB: 09-23-1934   Cancelled Treatment:    Reason Eval/Treat Not Completed: Patient at procedure or test/unavailable.  PT consult received.  Chart reviewed.  Pt currently off unit at dialysis.  Will re-attempt PT evaluation at a later date/time.  Leitha Bleak, PT 08/05/20, 1:25 PM

## 2020-08-05 NOTE — Op Note (Signed)
Operative Note  Preoperative diagnosis:    1. ESRD with functional permanent access  Postoperative diagnosis:   1. ESRD with functional permanent access  Procedure:  Removal of Right Permcath  Physician Assistant: Hezzie Bump  Surgeon:  [name], MD  Anesthesia:  Local  EBL:  Minimal  Indication for the Procedure:  The patient has a functional permanent dialysis access and no longer needs their permcath.  This can be removed.  Risks and benefits are discussed and informed consent is obtained.  Description of the Procedure:  The patient's right neck, chest and existing catheter were sterilely prepped and draped. The area around the catheter was anesthetized copiously with 1% lidocaine. The catheter was dissected out with curved hemostats until the cuff was freed from the surrounding fibrous sheath. The fiber sheath was transected, and the catheter was then removed in its entirety using gentle traction. Pressure was held and sterile dressings were placed. The patient tolerated the procedure well and was taken to the recovery room in stable condition.   [name] A [name]  08/05/2020, 4:59 PM   This note was created with Dragon Medical transcription system. Any errors in dictation are purely unintentional.

## 2020-08-05 NOTE — Progress Notes (Signed)
Date of Admission:  08/04/2020   86 y.o. with a history of CKD 4/5, hypothyroid, CAD s/p cabg, carotid stenosis, CVA 04/2020, hypothyroidism was recently in [facility] between 9/21-10/1/21 for worsening renal function  Sent to the ED for a positive blood culture [name] She denies fever, chills, sweats, cough, sob, pain abdomen, diarrhea Does not know why they sent her blood but was asked to go tot ED because of positive blood cuture She says she has had a wound rt foot for more than a month- She does not know how it started- no known trauma- started as blister. 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 Severe interstitial fibrosis is noted along with severe tubular injury. Patient started on prednisone 20 mg p.o.twice daily .Serologies from September 22 showed ANA positive,p-ANCA positive, titer 1: 640, low C3, elevated kappa to lambda ratio of 34.6 Status post tunnel catheter for hemodialysis via right IJ on 9/24 by Dr. Lucky Cowboy   Subjective: Says she is doing okay Medications:  . amLODipine  5 mg Oral Daily  . calcium carbonate  2 tablet Oral BID  . Chlorhexidine Gluconate Cloth  6 each Topical Q0600  . clopidogrel  75 mg Oral Daily  . gabapentin  200 mg Oral BID  . heparin  5,000 Units Subcutaneous Q8H  . insulin aspart  0-5 Units Subcutaneous QHS  . insulin aspart  0-9 Units Subcutaneous TID WC  . levothyroxine  150 mcg Oral Q0600  . metoprolol tartrate  50 mg Oral BID  . [START ON 08/06/2020] pneumococcal 23 valent vaccine  0.5 mL Intramuscular Tomorrow-1000  . torsemide  100 mg Oral Daily     Objective: Vital signs in last 24 hours: Temp:  [98.1 F (36.7 C)-99.7 F (37.6 C)] 99.7 F (37.6 C) (11/04 1000) Pulse Rate:  [80-95] 93 (11/04 0819) Resp:  [20] 20 (11/04 0359) BP: (113-141)/(50-66) 141/66 (11/04 1000) SpO2:  [95 %-100 %] 100 % (11/04 1000)  PHYSICAL EXAM:  General: Alert, cooperative, no distress, appears stated age.  Left cheek excoriation Lungs: Clear to auscultation bilaterally. No Wheezing or Rhonchi. No rales. Heart: Regular rate and rhythm, no murmur, rub or gallop. Abdomen: Soft,  Rt foot- wound    Lab Results Recent Labs    08/03/20 1557 08/03/20 1557 08/04/20 0714 08/05/20 0817  WBC 8.8   < > 8.5 6.9  HGB 8.6*   < > 9.0* 7.8*  HCT 27.0*   < > 28.4* 25.0*  NA 133*  --   --  134*  134*  K 4.7  --   --  4.1  4.1  CL 96*  --   --  99  100  CO2 26  --   --  24  23  BUN 37*  --   --  54*  57*  CREATININE 2.82*   < > 3.33* 3.95*  3.89*   < > = values in this interval not displayed.   Liver Panel Recent Labs    08/03/20 1557 08/05/20 0817  PROT 6.5  --   ALBUMIN 2.9* 2.2*  AST 12*  --   ALT 7  --  ALKPHOS 68  --   BILITOT 0.8  --    Sedimentation Rate No results for input(s): ESRSEDRATE in the last 72 hours. C-Reactive Protein No results for input(s): CRP in the last 72 hours.  Microbiology:  Studies/Results: DG Chest Port 1 View  Result Date: 08/04/2020 CLINICAL DATA:  Weakness EXAM: PORTABLE CHEST 1 VIEW COMPARISON:  06/23/2020 FINDINGS: Geryl Councilman overlies the lung apices. Minimal left basilar atelectasis. No definite pneumothorax. No pleural effusion. Right internal jugular hemodialysis catheter tip overlies the superior vena cava. Coronary artery bypass grafting has been performed. Mild cardiomegaly is stable. Pulmonary vascularity is normal. IMPRESSION: Interval placement of right internal jugular hemodialysis catheter. Tip within the SVC. Stable cardiomegaly. Electronically Signed   By: Fidela Salisbury MD   On: 08/04/2020  03:12   DG Foot Complete Right  Result Date: 08/04/2020 CLINICAL DATA:  Right foot ulcer, initial encounter EXAM: RIGHT FOOT COMPLETE - 3+ VIEW COMPARISON:  None. FINDINGS: No acute fracture or dislocation is noted. Soft tissue swelling is noted with focal ulceration dorsally identified consistent with the given history. No underlying bony erosive changes are seen at this time. IMPRESSION: Soft tissue swelling with focal ulceration dorsally. No definitive bony erosive changes are seen. Electronically Signed   By: Inez Catalina M.D.   On: 08/04/2020 19:03     Assessment/Plan: Staph warneri bacteremia in a patient with ESRD - has a dialysis catheter- This organism is resistant to oxacillin-continue vanco Source could be the dialysis catheter which lwas removed today Continue vanco 2 d echo done- reading pending Repeat blood culture   Rt foot wound- on dorsum of the foot- unclear etiology- culture done in wound clinic on 10/15 is pseudomonas, enterococcus, MSSA Okay to continue zosyn Seen by podiatrist Agree with skin /tissue biopsy as OP to r/o pyoderma gangrenosum ? ?CAD s/p CABG ? _Anemia due to ESRD  Discussed the management with Dr.Fowler

## 2020-08-05 NOTE — Progress Notes (Signed)
Mobility Specialist - Progress Note   08/05/20 1500  Mobility  Activity Ambulated in room  Level of Assistance Modified independent, requires aide device or extra time  Assistive Device Front wheel walker  Distance Ambulated (ft) 50 ft  Mobility Response Tolerated well  Mobility performed by Mobility specialist  $Mobility charge 1 Mobility    Pre-mobility: 90 HR, 96% SpO2 Post-mobility: 92 HR, 97% SpO2   Pt was sleeping in bed upon arrival. Pt was easily awakened and agreed to session. Pt c/o nausea "8/10" but was not limited for mobility. Pt was minA getting EOB (d/t LE support) and denied dizziness upon sitting. Pt stood to RW with modA where she progressed to ambulation with supervision. CGA was used for safety. Pt ambulated 50' in room with no LOB. Pt denied SOB and weakness. No heavy breathing noted. After ~30', pt began c/o pain in R foot. Pt was able to transition EOB-supine with minA for LE support. Overall, pt tolerated session well. Pt was left in bed with all needs in reach and alarm set.   Kathee Delton Mobility Specialist 08/05/20, 3:50 PM

## 2020-08-05 NOTE — Progress Notes (Signed)
PT transferred back to room 228 via hopsital bed by transport.  Pt alert/oriented, vss at time to transfer. Dressing to right chest c/d/i

## 2020-08-05 NOTE — Progress Notes (Signed)
OT Cancellation Note  Patient Details Name: KOURTNEE LAHEY MRN: [id number] DOB: Jan 22, 1934   Cancelled Treatment:    Reason Eval/Treat Not Completed: Patient at procedure or test/ unavailable  OT consult received and chart reviewed. Pt off floor to HD at this time. Will f/u as able for OT evaluation. Thank you.  Gerrianne Scale, [place], OTR/L ascom [phone] 08/05/20, 12:30 PM

## 2020-08-05 NOTE — Progress Notes (Signed)
PROGRESS NOTE    [name]  OMV:[id number] DOB: 10-Feb-1934 DOA: 08/04/2020 PCP: Idelle Crouch, MD   Brief Narrative:  86 year old patient with history significant for ESRD on HD TTS, CAD status post CABG, CVA with residual right lower extremity weakness, type II DM who presented to the emergency department at the request of her nephrologist given concern for gram-positive bacteremia.  Results of outpatient blood culture were sent to the inpatient nephrology consultant Dr. Juleen China.  We will attempt to have these uploaded into care everywhere.  Preliminary results from blood cultures growing Staphylococcus warneri.  Concerning resistance pattern but is sensitive to vancomycin which she is on  Regarding her foot wound wound and ostomy has evaluated in place dressing.  I have taken a picture and included in the medical record.  I have requested consultation from Dr. Vickki Muff of podiatry.  Will defer any further imaging after surgical evaluation.  11/4: Consultations obtained with infectious disease and podiatry.  Per podiatry no acute need for surgical management.  Recommending outpatient biopsy to rule out pyoderma gangrenosum and consideration for outpatient wound VAC.  Per ID source may be related to indwelling dialysis catheter.  Recommending dialysis catheter removal.  Communicated with nephrology, plan to remove dialysis catheter after HD today.  No fevers noted over interval.  All cultures on admission remain negative.   Assessment & Plan:   Principal Problem:   Wound infection Active Problems:   Hyperlipidemia   Hypertension   GERD (gastroesophageal reflux disease)   S/P CABG x 4   CAD (coronary artery disease)   Type 2 diabetes with nephropathy (HCC)   ESRD on hemodialysis (HCC)   History of CVA with residual deficit   Ambulatory dysfunction   Anemia of chronic kidney failure, stage 5 (HCC)   Pressure injury of skin  Dorsal foot wound -Wound culture from 10/15 grew  Pseudomonas aeruginosa, Enterococcus faecalis and staph aureus -Etiology of wound is unclear -Infectious disease on consult -Podiatry on consult, no acute surgical management Plan: Continue vancomycin and Zosyn for now Continue wound care Extremity elevation Pain control as needed  Staph warneri bacteremia Noted on outpatient blood culture Resistant to oxacillin Currently on vancomycin No fevers over interval Plan: I will plan to remove HD catheter per ID recommendations as possible source of bacteremia Continue vancomycin for now Follow-up 2D echocardiogram Follow surveillance cultures Appreciate ID follow-up    Hypertension -Stable -Continue amlodipine, metoprolol    GERD (gastroesophageal reflux disease) -Chronic and stable.  Continue famotidine    S/P CABG x 4   CAD (coronary artery disease) -Chronic and stable with no complaints of chest pain -Continue aspirin, Plavix, metoprolol.  Statin not on med list  Hypothyroidism -Continue levothyroxine    Type 2 diabetes with nephropathy (HCC) -Sliding scale insulin    ESRD on hemodialysis [facility]) -Nephrology consult for continuation of dialysis    History of CVA with residual right lower extremity weakness   Ambulatory dysfunction -Continue.  Not currently on statin per med list -Patient ambulate with a cane    Anemia of chronic kidney failure, stage 5 (HCC) -Chronic and stable   DVT prophylaxis: Subcutaneous heparin Code Status: Full code Family Communication: Husband at bedside Disposition Plan: Status is: Inpatient  Remains inpatient appropriate because:IV treatments appropriate due to intensity of illness or inability to take PO and Inpatient level of care appropriate due to severity of illness   Dispo: The patient is from: Home  Anticipated d/c is to: Home              Anticipated d/c date is: > 3 days              Patient currently is not medically stable to d/c.   Staphylococcal  bacteremia.  Suspected source hemodialysis catheter.  Per nephrology we anticipate removing hemodialysis catheter today after HD session with plans to replace on Monday, 08/09/2020  Consultants:   Nephrology  Infectious disease  Podiatry  Vascular surgery  Procedures:   None  Antimicrobials:   Vancomycin  Zosyn   Subjective: Seen and examined.  Husband at bedside.  Patient endorses no pain.  Does endorse some diarrhea  Objective: Vitals:   08/04/20 2004 08/04/20 2320 08/05/20 0359 08/05/20 0819  BP: (!) 125/57 (!) 117/50 (!) 116/54 113/63  Pulse: 95 80 90 93  Resp: 20  20   Temp: 98.7 F (37.1 C) 99.4 F (37.4 C) 98.1 F (36.7 C) 98.8 F (37.1 C)  TempSrc: Oral Oral Oral Oral  SpO2: 97% 95% 96% 95%  Weight:      Height:        Intake/Output Summary (Last 24 hours) at 08/05/2020 1040 Last data filed at 08/05/2020 0900 Gross per 24 hour  Intake 480 ml  Output --  Net 480 ml   Filed Weights   08/03/20 1554 08/04/20 0602  Weight: 84.4 kg 92.4 kg    Examination:  General exam: Appears calm and comfortable  Respiratory system: Clear to auscultation. Respiratory effort normal. Cardiovascular system: S1 & S2 heard, RRR. No JVD, murmurs, rubs, gallops or clicks. No pedal edema. Gastrointestinal system: Hyperactive bowel sounds, soft, nondistended, no masses Central nervous system: Alert and oriented. No focal neurological deficits. Extremities: Symmetric 5 x 5 power. Skin:  5 x 7 cm ulcerative wound to dorsal aspect of right midfoot Psychiatry: Judgement and insight appear normal. Mood & affect appropriate.     Data Reviewed: I have personally reviewed following labs and imaging studies  CBC: Recent Labs  Lab 08/03/20 1557 08/04/20 0714 08/05/20 0817  WBC 8.8 8.5 6.9  NEUTROABS  --   --  4.8  HGB 8.6* 9.0* 7.8*  HCT 27.0* 28.4* 25.0*  MCV 97.1 98.6 97.7  PLT 163 132* 643*   Basic Metabolic Panel: Recent Labs  Lab 08/03/20 1557 08/04/20 0714  08/05/20 0817  NA 133*  --  134*  K 4.7  --  4.1  CL 96*  --  100  CO2 26  --  23  GLUCOSE 272*  --  97  BUN 37*  --  57*  CREATININE 2.82* 3.33* 3.89*  CALCIUM 6.7*  --  6.4*   GFR: Estimated Creatinine Clearance: 12.1 mL/min (A) (by C-G formula based on SCr of 3.89 mg/dL (H)). Liver Function Tests: Recent Labs  Lab 08/03/20 1557  AST 12*  ALT 7  ALKPHOS 68  BILITOT 0.8  PROT 6.5  ALBUMIN 2.9*   Recent Labs  Lab 08/03/20 1557  LIPASE 34   No results for input(s): AMMONIA in the last 168 hours. Coagulation Profile: No results for input(s): INR, PROTIME in the last 168 hours. Cardiac Enzymes: No results for input(s): CKTOTAL, CKMB, CKMBINDEX, TROPONINI in the last 168 hours. BNP (last 3 results) No results for input(s): PROBNP in the last 8760 hours. HbA1C: No results for input(s): HGBA1C in the last 72 hours. CBG: Recent Labs  Lab 08/05/20 0014 08/05/20 0036 08/05/20 0214 08/05/20 0515 08/05/20 0749  GLUCAP  69* 86 83 87 93   Lipid Profile: No results for input(s): CHOL, HDL, LDLCALC, TRIG, CHOLHDL, LDLDIRECT in the last 72 hours. Thyroid Function Tests: No results for input(s): TSH, T4TOTAL, FREET4, T3FREE, THYROIDAB in the last 72 hours. Anemia Panel: No results for input(s): VITAMINB12, FOLATE, FERRITIN, TIBC, IRON, RETICCTPCT in the last 72 hours. Sepsis Labs: Recent Labs  Lab 08/04/20 0309  PROCALCITON 0.41  LATICACIDVEN 1.2    Recent Results (from the past 240 hour(s))  Culture, blood (routine x 2)     Status: None (Preliminary result)   Collection Time: 08/04/20  3:09 AM   Specimen: BLOOD  Result Value Ref Range Status   Specimen Description BLOOD BLOOD LEFT ARM  Final   Special Requests   Final    BOTTLES DRAWN AEROBIC AND ANAEROBIC Blood Culture results may not be optimal due to an inadequate volume of blood received in culture bottles   Culture   Final    NO GROWTH 1 DAY Performed at [facility], [address],  [place], [place] [address]    Report Status PENDING  Incomplete  Culture, blood (routine x 2)     Status: None (Preliminary result)   Collection Time: 08/04/20  3:09 AM   Specimen: BLOOD  Result Value Ref Range Status   Specimen Description BLOOD LEFT ANTECUBITAL  Final   Special Requests   Final    BOTTLES DRAWN AEROBIC AND ANAEROBIC Blood Culture results may not be optimal due to an inadequate volume of blood received in culture bottles   Culture   Final    NO GROWTH 1 DAY Performed at [facility], [address], [place], [place] [address]    Report Status PENDING  Incomplete  Respiratory Panel by RT PCR (Flu A&B, Covid) - Nasopharyngeal Swab     Status: None   Collection Time: 08/04/20  3:09 AM   Specimen: Nasopharyngeal Swab  Result Value Ref Range Status   SARS Coronavirus 2 by RT PCR NEGATIVE NEGATIVE Final    Comment: (NOTE) SARS-CoV-2 target nucleic acids are NOT DETECTED.  The SARS-CoV-2 RNA is generally detectable in upper respiratoy specimens during the acute phase of infection. The lowest concentration of SARS-CoV-2 viral copies this assay can detect is 131 copies/mL. A negative result does not preclude SARS-Cov-2 infection and should not be used as the sole basis for treatment or other patient management decisions. A negative result may occur with  improper specimen collection/handling, submission of specimen other than nasopharyngeal swab, presence of viral mutation(s) within the areas targeted by this assay, and inadequate number of viral copies (<131 copies/mL). A negative result must be combined with clinical observations, patient history, and epidemiological information. The expected result is Negative.  Fact Sheet for Patients:  PinkCheek.be  Fact Sheet for Healthcare Providers:  GravelBags.it  This test is no t yet approved or cleared by the [place] FDA and  has been authorized for  detection and/or diagnosis of SARS-CoV-2 by FDA under an Emergency Use Authorization (EUA). This EUA will remain  in effect (meaning this test can be used) for the duration of the COVID-19 declaration under Section 564(b)(1) of the Act, 21 U.S.C. section 360bbb-3(b)(1), unless the authorization is terminated or revoked sooner.     Influenza A by PCR NEGATIVE NEGATIVE Final   Influenza B by PCR NEGATIVE NEGATIVE Final    Comment: (NOTE) The Xpert Xpress SARS-CoV-2/FLU/RSV assay is intended as an aid in  the diagnosis of influenza from Nasopharyngeal swab  specimens and  should not be used as a sole basis for treatment. Nasal washings and  aspirates are unacceptable for Xpert Xpress SARS-CoV-2/FLU/RSV  testing.  Fact Sheet for Patients: PinkCheek.be  Fact Sheet for Healthcare Providers: GravelBags.it  This test is not yet approved or cleared by the [place] FDA and  has been authorized for detection and/or diagnosis of SARS-CoV-2 by  FDA under an Emergency Use Authorization (EUA). This EUA will remain  in effect (meaning this test can be used) for the duration of the  Covid-19 declaration under Section 564(b)(1) of the Act, 21  U.S.C. section 360bbb-3(b)(1), unless the authorization is  terminated or revoked. Performed at [facility], [address] [place], [place] [address]          Radiology Studies: DG Chest [place] 1 View  Result Date: 08/04/2020 CLINICAL DATA:  Weakness EXAM: PORTABLE CHEST 1 VIEW COMPARISON:  06/23/2020 FINDINGS: Geryl Councilman overlies the lung apices. Minimal left basilar atelectasis. No definite pneumothorax. No pleural effusion. Right internal jugular hemodialysis catheter tip overlies the superior vena cava. Coronary artery bypass grafting has been performed. Mild cardiomegaly is stable. Pulmonary vascularity is normal. IMPRESSION: Interval placement of right internal jugular hemodialysis  catheter. Tip within the SVC. Stable cardiomegaly. Electronically Signed   By: Fidela Salisbury MD   On: 08/04/2020 03:12   DG Foot Complete Right  Result Date: 08/04/2020 CLINICAL DATA:  Right foot ulcer, initial encounter EXAM: RIGHT FOOT COMPLETE - 3+ VIEW COMPARISON:  None. FINDINGS: No acute fracture or dislocation is noted. Soft tissue swelling is noted with focal ulceration dorsally identified consistent with the given history. No underlying bony erosive changes are seen at this time. IMPRESSION: Soft tissue swelling with focal ulceration dorsally. No definitive bony erosive changes are seen. Electronically Signed   By: Inez Catalina M.D.   On: 08/04/2020 19:03        Scheduled Meds: . amLODipine  5 mg Oral Daily  . Chlorhexidine Gluconate Cloth  6 each Topical Q0600  . clopidogrel  75 mg Oral Daily  . gabapentin  200 mg Oral BID  . heparin  5,000 Units Subcutaneous Q8H  . insulin aspart  0-5 Units Subcutaneous QHS  . insulin aspart  0-9 Units Subcutaneous TID WC  . levothyroxine  150 mcg Oral Q0600  . metoprolol tartrate  50 mg Oral BID  . [START ON 08/06/2020] pneumococcal 23 valent vaccine  0.5 mL Intramuscular Tomorrow-1000   Continuous Infusions: . dextrose 5% lactated ringers    . piperacillin-tazobactam (ZOSYN)  IV 2.25 g (08/05/20 0531)  . vancomycin       LOS: 1 day    Time spent: 35 minutes    Sidney Ace, MD [facility] Pager [phone]  If 7PM-7AM, please contact night-coverage 08/05/2020, 10:40 AM

## 2020-08-05 NOTE — Progress Notes (Signed)
[facility]  ROUNDING NOTE   Subjective:   Seen and examined on hemodialysis treatment. Tolerating treatment well.   ID is recommending removal of tunneled dialysis catheter after HD treatment today and then patient to have a catheter free period.    Objective:  Vital signs in last 24 hours:  Temp:  [98.1 F (36.7 C)-99.7 F (37.6 C)] 99.7 F (37.6 C) (11/04 1000) Pulse Rate:  [80-95] 93 (11/04 0819) Resp:  [20] 20 (11/04 0359) BP: (113-141)/(50-66) 141/66 (11/04 1000) SpO2:  [95 %-100 %] 100 % (11/04 1000)  Weight change:  Filed Weights   08/03/20 1554 08/04/20 0602  Weight: 84.4 kg 92.4 kg    Intake/Output: I/O last 3 completed shifts: In: 240 [P.O.:240] Out: -    Intake/Output this shift:  Total I/O In: 240 [P.O.:240] Out: 1500 [Other:1500]  Physical Exam: General: Laying in bed  Head: Moist oral mucosal membranes  Eyes: Anicteric  Lungs:  Clear to auscultation bilaterally, normal and symmetrical respiratory effort  Heart: Regular rate and rhythm  Abdomen:  Soft, nontender,   Extremities:  2+peripheral edema, lower legs wrapped with dressing  Neurologic: Oriented, moving all four extremities  Skin: Bilateral lower legs with dressing  Access: Rt IJ Permcath    Basic Metabolic Panel: Recent Labs  Lab 08/03/20 1557 08/04/20 0714 08/05/20 0817  NA 133*  --  134*  134*  K 4.7  --  4.1  4.1  CL 96*  --  99  100  CO2 26  --  24  23  GLUCOSE 272*  --  90  97  BUN 37*  --  54*  57*  CREATININE 2.82* 3.33* 3.95*  3.89*  CALCIUM 6.7*  --  6.3*  6.4*  PHOS  --   --  4.3    Liver Function Tests: Recent Labs  Lab 08/03/20 1557 08/05/20 0817  AST 12*  --   ALT 7  --   ALKPHOS 68  --   BILITOT 0.8  --   PROT 6.5  --   ALBUMIN 2.9* 2.2*   Recent Labs  Lab 08/03/20 1557  LIPASE 34   No results for input(s): AMMONIA in the last 168 hours.  CBC: Recent Labs  Lab 08/03/20 1557 08/04/20 0714 08/05/20 0817  WBC 8.8 8.5 6.9   NEUTROABS  --   --  4.8  HGB 8.6* 9.0* 7.8*  HCT 27.0* 28.4* 25.0*  MCV 97.1 98.6 97.7  PLT 163 132* 116*    Cardiac Enzymes: No results for input(s): CKTOTAL, CKMB, CKMBINDEX, TROPONINI in the last 168 hours.  BNP: Invalid input(s): POCBNP  CBG: Recent Labs  Lab 08/05/20 0014 08/05/20 0036 08/05/20 0214 08/05/20 0515 08/05/20 0749  GLUCAP 69* 86 83 87 93    Microbiology: Results for orders placed or performed during the hospital encounter of 08/04/20  Culture, blood (routine x 2)     Status: None (Preliminary result)   Collection Time: 08/04/20  3:09 AM   Specimen: BLOOD  Result Value Ref Range Status   Specimen Description BLOOD BLOOD LEFT ARM  Final   Special Requests   Final    BOTTLES DRAWN AEROBIC AND ANAEROBIC Blood Culture results may not be optimal due to an inadequate volume of blood received in culture bottles   Culture   Final    NO GROWTH 1 DAY Performed at [facility], [address] [place], [place] [address]    Report Status PENDING  Incomplete  Culture, blood (routine  x 2)     Status: None (Preliminary result)   Collection Time: 08/04/20  3:09 AM   Specimen: BLOOD  Result Value Ref Range Status   Specimen Description BLOOD LEFT ANTECUBITAL  Final   Special Requests   Final    BOTTLES DRAWN AEROBIC AND ANAEROBIC Blood Culture results may not be optimal due to an inadequate volume of blood received in culture bottles   Culture   Final    NO GROWTH 1 DAY Performed at [facility], [address] [place], [place] [address]    Report Status PENDING  Incomplete  Respiratory Panel by RT PCR (Flu A&B, Covid) - Nasopharyngeal Swab     Status: None   Collection Time: 08/04/20  3:09 AM   Specimen: Nasopharyngeal Swab  Result Value Ref Range Status   SARS Coronavirus 2 by RT PCR NEGATIVE NEGATIVE Final    Comment: (NOTE) SARS-CoV-2 target nucleic acids are NOT DETECTED.  The SARS-CoV-2 RNA is generally detectable in upper  respiratoy specimens during the acute phase of infection. The lowest concentration of SARS-CoV-2 viral copies this assay can detect is 131 copies/mL. A negative result does not preclude SARS-Cov-2 infection and should not be used as the sole basis for treatment or other patient management decisions. A negative result may occur with  improper specimen collection/handling, submission of specimen other than nasopharyngeal swab, presence of viral mutation(s) within the areas targeted by this assay, and inadequate number of viral copies (<131 copies/mL). A negative result must be combined with clinical observations, patient history, and epidemiological information. The expected result is Negative.  Fact Sheet for Patients:  PinkCheek.be  Fact Sheet for Healthcare Providers:  GravelBags.it  This test is no t yet approved or cleared by the [place] FDA and  has been authorized for detection and/or diagnosis of SARS-CoV-2 by FDA under an Emergency Use Authorization (EUA). This EUA will remain  in effect (meaning this test can be used) for the duration of the COVID-19 declaration under Section 564(b)(1) of the Act, 21 U.S.C. section 360bbb-3(b)(1), unless the authorization is terminated or revoked sooner.     Influenza A by PCR NEGATIVE NEGATIVE Final   Influenza B by PCR NEGATIVE NEGATIVE Final    Comment: (NOTE) The Xpert Xpress SARS-CoV-2/FLU/RSV assay is intended as an aid in  the diagnosis of influenza from Nasopharyngeal swab specimens and  should not be used as a sole basis for treatment. Nasal washings and  aspirates are unacceptable for Xpert Xpress SARS-CoV-2/FLU/RSV  testing.  Fact Sheet for Patients: PinkCheek.be  Fact Sheet for Healthcare Providers: GravelBags.it  This test is not yet approved or cleared by the [place] FDA and  has been  authorized for detection and/or diagnosis of SARS-CoV-2 by  FDA under an Emergency Use Authorization (EUA). This EUA will remain  in effect (meaning this test can be used) for the duration of the  Covid-19 declaration under Section 564(b)(1) of the Act, 21  U.S.C. section 360bbb-3(b)(1), unless the authorization is  terminated or revoked. Performed at [facility], [address] [place], [place] [address]     Coagulation Studies: No results for input(s): LABPROT, INR in the last 72 hours.  Urinalysis: No results for input(s): COLORURINE, LABSPEC, PHURINE, GLUCOSEU, HGBUR, BILIRUBINUR, KETONESUR, PROTEINUR, UROBILINOGEN, NITRITE, LEUKOCYTESUR in the last 72 hours.  Invalid input(s): APPERANCEUR    Imaging: DG Chest Port 1 View  Result Date: 08/04/2020 CLINICAL DATA:  Weakness EXAM: PORTABLE CHEST 1 VIEW COMPARISON:  06/23/2020 FINDINGS:  Chin overlies the lung apices. Minimal left basilar atelectasis. No definite pneumothorax. No pleural effusion. Right internal jugular hemodialysis catheter tip overlies the superior vena cava. Coronary artery bypass grafting has been performed. Mild cardiomegaly is stable. Pulmonary vascularity is normal. IMPRESSION: Interval placement of right internal jugular hemodialysis catheter. Tip within the SVC. Stable cardiomegaly. Electronically Signed   By: Fidela Salisbury MD   On: 08/04/2020 03:12   DG Foot Complete Right  Result Date: 08/04/2020 CLINICAL DATA:  Right foot ulcer, initial encounter EXAM: RIGHT FOOT COMPLETE - 3+ VIEW COMPARISON:  None. FINDINGS: No acute fracture or dislocation is noted. Soft tissue swelling is noted with focal ulceration dorsally identified consistent with the given history. No underlying bony erosive changes are seen at this time. IMPRESSION: Soft tissue swelling with focal ulceration dorsally. No definitive bony erosive changes are seen. Electronically Signed   By: Inez Catalina M.D.   On: 08/04/2020 19:03      Medications:   . dextrose 5% lactated ringers    . piperacillin-tazobactam (ZOSYN)  IV 2.25 g (08/05/20 0531)  . vancomycin     . amLODipine  5 mg Oral Daily  . Chlorhexidine Gluconate Cloth  6 each Topical Q0600  . clopidogrel  75 mg Oral Daily  . gabapentin  200 mg Oral BID  . heparin  5,000 Units Subcutaneous Q8H  . insulin aspart  0-5 Units Subcutaneous QHS  . insulin aspart  0-9 Units Subcutaneous TID WC  . levothyroxine  150 mcg Oral Q0600  . metoprolol tartrate  50 mg Oral BID  . [START ON 08/06/2020] pneumococcal 23 valent vaccine  0.5 mL Intramuscular Tomorrow-1000   diphenoxylate-atropine, loperamide, oxyCODONE  Assessment/ Plan:  [name] is a 86 y.o.  female  well known to our practice. Patient was sent in to ED from dialysis clinic for bacteremia. Cultures growing Staph Warneri  [facility] TTS [place] IJ permcath 91.5kg  #ESRD on dialysis TTS  Seen and examined on hemodialysis treatment Vascular to remove tunneled catheter today and then patient to have a catheter free period.  Appreciate ID and vascular input.   # Hypertension Blood Pressure readings within acceptable range. Currently regimen of amlodipine and metoprolol. Due to peripheral edema, will start torsemide  #Secondary Hyperparathyroidism with hypocalemia. Correct calcium of 7.7. Phosphorus at goal.  - start calcium carbonate bid.   # Anemia of CKD - EPO with HD treatments    LOS: 1 [name] 11/4/20212:28 PM

## 2020-08-06 ENCOUNTER — Encounter: Payer: Self-pay | Admitting: Vascular Surgery

## 2020-08-06 DIAGNOSIS — R7881 Bacteremia: Secondary | ICD-10-CM | POA: Diagnosis not present

## 2020-08-06 DIAGNOSIS — B958 Unspecified staphylococcus as the cause of diseases classified elsewhere: Secondary | ICD-10-CM | POA: Diagnosis not present

## 2020-08-06 DIAGNOSIS — S91301A Unspecified open wound, right foot, initial encounter: Secondary | ICD-10-CM | POA: Diagnosis not present

## 2020-08-06 DIAGNOSIS — N186 End stage renal disease: Secondary | ICD-10-CM | POA: Diagnosis not present

## 2020-08-06 LAB — CBC WITH DIFFERENTIAL/PLATELET
Abs Immature Granulocytes: 0.04 10*3/uL (ref 0.00–0.07)
Basophils Absolute: 0 10*3/uL (ref 0.0–0.1)
Basophils Relative: 0 %
Eosinophils Absolute: 0.1 10*3/uL (ref 0.0–0.5)
Eosinophils Relative: 2 %
HCT: 22.4 % — ABNORMAL LOW (ref 36.0–46.0)
Hemoglobin: 6.9 g/dL — ABNORMAL LOW (ref 12.0–15.0)
Immature Granulocytes: 1 %
Lymphocytes Relative: 26 %
Lymphs Abs: 1.2 10*3/uL (ref 0.7–4.0)
MCH: 30.1 pg (ref 26.0–34.0)
MCHC: 30.8 g/dL (ref 30.0–36.0)
MCV: 97.8 fL (ref 80.0–100.0)
Monocytes Absolute: 0.2 10*3/uL (ref 0.1–1.0)
Monocytes Relative: 5 %
Neutro Abs: 3 10*3/uL (ref 1.7–7.7)
Neutrophils Relative %: 66 %
Platelets: 92 10*3/uL — ABNORMAL LOW (ref 150–400)
RBC: 2.29 MIL/uL — ABNORMAL LOW (ref 3.87–5.11)
RDW: 17.7 % — ABNORMAL HIGH (ref 11.5–15.5)
WBC: 4.5 10*3/uL (ref 4.0–10.5)
nRBC: 0 % (ref 0.0–0.2)

## 2020-08-06 LAB — BASIC METABOLIC PANEL
Anion gap: 9 (ref 5–15)
BUN: 29 mg/dL — ABNORMAL HIGH (ref 8–23)
CO2: 25 mmol/L (ref 22–32)
Calcium: 6.7 mg/dL — ABNORMAL LOW (ref 8.9–10.3)
Chloride: 98 mmol/L (ref 98–111)
Creatinine, Ser: 3.27 mg/dL — ABNORMAL HIGH (ref 0.44–1.00)
GFR, Estimated: 13 mL/min — ABNORMAL LOW (ref >60)
Glucose, Bld: 109 mg/dL — ABNORMAL HIGH (ref 70–99)
Potassium: 4 mmol/L (ref 3.5–5.1)
Sodium: 132 mmol/L — ABNORMAL LOW (ref 135–145)

## 2020-08-06 LAB — ECHOCARDIOGRAM COMPLETE
AR max vel: 1.53 cm2
AV Area VTI: 1.63 cm2
AV Area mean vel: 1.53 cm2
AV Mean grad: 8 mmHg
AV Peak grad: 13.7 mmHg
Ao pk vel: 1.85 m/s
Area-P 1/2: 5.66 cm2
Height: 64 in
S' Lateral: 2.56 cm
Weight: 3259.28 oz

## 2020-08-06 LAB — GLUCOSE, CAPILLARY
Glucose-Capillary: 103 mg/dL — ABNORMAL HIGH (ref 70–99)
Glucose-Capillary: 126 mg/dL — ABNORMAL HIGH (ref 70–99)
Glucose-Capillary: 101 mg/dL — ABNORMAL HIGH (ref 70–99)
Glucose-Capillary: 127 mg/dL — ABNORMAL HIGH (ref 70–99)

## 2020-08-06 LAB — HEPATITIS B DNA, ULTRAQUANTITATIVE, PCR
HBV DNA SERPL PCR-ACNC: NOT DETECTED IU/mL
HBV DNA SERPL PCR-LOG IU: UNDETERMINED log10 IU/mL

## 2020-08-06 LAB — HEMOGLOBIN: Hemoglobin: 7.3 g/dL — ABNORMAL LOW (ref 12.0–15.0)

## 2020-08-06 LAB — PREPARE RBC (CROSSMATCH)

## 2020-08-06 MED ORDER — VANCOMYCIN VARIABLE DOSE PER UNSTABLE RENAL FUNCTION (PHARMACIST DOSING): Status: DC

## 2020-08-06 MED ORDER — FUROSEMIDE 10 MG/ML IJ SOLN: 20.0000 mg | Freq: Once | INTRAMUSCULAR | Status: DC

## 2020-08-06 MED ORDER — SODIUM CHLORIDE 0.9% IV SOLUTION: Freq: Once | INTRAVENOUS | Status: AC

## 2020-08-06 NOTE — Progress Notes (Signed)
[facility]  ROUNDING NOTE   Subjective:   Patient sitting on a recliner, appears pleasant and interactive with staff and nursing students. She denies fever,chills,SOB or any discomfort.   Objective:  Vital signs in last 24 hours:  Temp:  [98.4 F (36.9 C)-98.9 F (37.2 C)] 98.8 F (37.1 C) (11/05 1601) Pulse Rate:  [91-97] 91 (11/05 1601) Resp:  [19-20] 19 (11/05 1601) BP: (89-116)/(43-56) 110/50 (11/05 1601) SpO2:  [91 %-96 %] 94 % (11/05 1601)  Weight change:  Filed Weights   08/03/20 1554 08/04/20 0602  Weight: 84.4 kg 92.4 kg    Intake/Output: I/O last 3 completed shifts: In: 720 [P.O.:720] Out: 1600 [Urine:100; Other:1500]   Intake/Output this shift:  Total I/O In: 120 [P.O.:120] Out: -   Physical Exam: General: In no acute distress  Head: Normocephalic,Atraumatic  Eyes: Anicteric  Lungs:  Normal and symmetrical respiratory effort,Lungs clear  Heart: Regular rate and rhythm  Abdomen:  Soft, nontender, non distended  Extremities:  2+peripheral edema, lower legs wrapped with dressing  Neurologic: Awake,alert,oriented  Skin: Bilateral lower legs with dressing  Access: Rt IJ Permcath discontinued on 76/19/5093    Basic Metabolic Panel: Recent Labs  Lab 08/03/20 1557 08/04/20 0714 08/05/20 0817 08/06/20 0535  NA 133*  --  134*  134* 132*  K 4.7  --  4.1  4.1 4.0  CL 96*  --  99  100 98  CO2 26  --  24  23 25   GLUCOSE 272*  --  90  97 109*  BUN 37*  --  54*  57* 29*  CREATININE 2.82* 3.33* 3.95*  3.89* 3.27*  CALCIUM 6.7*  --  6.3*  6.4* 6.7*  PHOS  --   --  4.3  --     Liver Function Tests: Recent Labs  Lab 08/03/20 1557 08/05/20 0817  AST 12*  --   ALT 7  --   ALKPHOS 68  --   BILITOT 0.8  --   PROT 6.5  --   ALBUMIN 2.9* 2.2*   Recent Labs  Lab 08/03/20 1557  LIPASE 34   No results for input(s): AMMONIA in the last 168 hours.  CBC: Recent Labs  Lab 08/03/20 1557 08/04/20 0714 08/05/20 0817  08/06/20 0535 08/06/20 1246  WBC 8.8 8.5 6.9 4.5  --   NEUTROABS  --   --  4.8 3.0  --   HGB 8.6* 9.0* 7.8* 6.9* 7.3*  HCT 27.0* 28.4* 25.0* 22.4*  --   MCV 97.1 98.6 97.7 97.8  --   PLT 163 132* 116* 92*  --     Cardiac Enzymes: No results for input(s): CKTOTAL, CKMB, CKMBINDEX, TROPONINI in the last 168 hours.  BNP: Invalid input(s): POCBNP  CBG: Recent Labs  Lab 08/05/20 1439 08/05/20 2103 08/06/20 0815 08/06/20 1154 08/06/20 1603  GLUCAP 87 147* 103* 126* 101*    Microbiology: Results for orders placed or performed during the hospital encounter of 08/04/20  Culture, blood (routine x 2)     Status: None (Preliminary result)   Collection Time: 08/04/20  3:09 AM   Specimen: BLOOD  Result Value Ref Range Status   Specimen Description BLOOD BLOOD LEFT ARM  Final   Special Requests   Final    BOTTLES DRAWN AEROBIC AND ANAEROBIC Blood Culture results may not be optimal due to an inadequate volume of blood received in culture bottles   Culture   Final    NO GROWTH 2 DAYS Performed at  [facility], [address], [place], [place] [address]    Report Status PENDING  Incomplete  Culture, blood (routine x 2)     Status: None (Preliminary result)   Collection Time: 08/04/20  3:09 AM   Specimen: BLOOD  Result Value Ref Range Status   Specimen Description BLOOD LEFT ANTECUBITAL  Final   Special Requests   Final    BOTTLES DRAWN AEROBIC AND ANAEROBIC Blood Culture results may not be optimal due to an inadequate volume of blood received in culture bottles   Culture   Final    NO GROWTH 2 DAYS Performed at [facility], [address], [place], [place] [address]    Report Status PENDING  Incomplete  Respiratory Panel by RT PCR (Flu A&B, Covid) - Nasopharyngeal Swab     Status: None   Collection Time: 08/04/20  3:09 AM   Specimen: Nasopharyngeal Swab  Result Value Ref Range Status   SARS Coronavirus 2 by RT PCR NEGATIVE NEGATIVE Final    Comment:  (NOTE) SARS-CoV-2 target nucleic acids are NOT DETECTED.  The SARS-CoV-2 RNA is generally detectable in upper respiratoy specimens during the acute phase of infection. The lowest concentration of SARS-CoV-2 viral copies this assay can detect is 131 copies/mL. A negative result does not preclude SARS-Cov-2 infection and should not be used as the sole basis for treatment or other patient management decisions. A negative result may occur with  improper specimen collection/handling, submission of specimen other than nasopharyngeal swab, presence of viral mutation(s) within the areas targeted by this assay, and inadequate number of viral copies (<131 copies/mL). A negative result must be combined with clinical observations, patient history, and epidemiological information. The expected result is Negative.  Fact Sheet for Patients:  PinkCheek.be  Fact Sheet for Healthcare Providers:  GravelBags.it  This test is no t yet approved or cleared by the [place] FDA and  has been authorized for detection and/or diagnosis of SARS-CoV-2 by FDA under an Emergency Use Authorization (EUA). This EUA will remain  in effect (meaning this test can be used) for the duration of the COVID-19 declaration under Section 564(b)(1) of the Act, 21 U.S.C. section 360bbb-3(b)(1), unless the authorization is terminated or revoked sooner.     Influenza A by PCR NEGATIVE NEGATIVE Final   Influenza B by PCR NEGATIVE NEGATIVE Final    Comment: (NOTE) The Xpert Xpress SARS-CoV-2/FLU/RSV assay is intended as an aid in  the diagnosis of influenza from Nasopharyngeal swab specimens and  should not be used as a sole basis for treatment. Nasal washings and  aspirates are unacceptable for Xpert Xpress SARS-CoV-2/FLU/RSV  testing.  Fact Sheet for Patients: PinkCheek.be  Fact Sheet for Healthcare  Providers: GravelBags.it  This test is not yet approved or cleared by the [place] FDA and  has been authorized for detection and/or diagnosis of SARS-CoV-2 by  FDA under an Emergency Use Authorization (EUA). This EUA will remain  in effect (meaning this test can be used) for the duration of the  Covid-19 declaration under Section 564(b)(1) of the Act, 21  U.S.C. section 360bbb-3(b)(1), unless the authorization is  terminated or revoked. Performed at [facility], [address] [place], [place] [address]   CULTURE, BLOOD (ROUTINE X 2) w Reflex to ID Panel     Status: None (Preliminary result)   Collection Time: 08/06/20 12:17 AM   Specimen: BLOOD  Result Value Ref Range Status   Specimen Description BLOOD BLOOD LEFT HAND  Final  Special Requests   Final    BOTTLES DRAWN AEROBIC AND ANAEROBIC Blood Culture results may not be optimal due to an inadequate volume of blood received in culture bottles   Culture   Final    NO GROWTH < 12 HOURS Performed at [facility], [address] [place], [place] [address]    Report Status PENDING  Incomplete    Coagulation Studies: No results for input(s): LABPROT, INR in the last 72 hours.  Urinalysis: No results for input(s): COLORURINE, LABSPEC, PHURINE, GLUCOSEU, HGBUR, BILIRUBINUR, KETONESUR, PROTEINUR, UROBILINOGEN, NITRITE, LEUKOCYTESUR in the last 72 hours.  Invalid input(s): APPERANCEUR    Imaging: PERIPHERAL VASCULAR CATHETERIZATION  Result Date: 08/05/2020 See op note  DG Foot Complete Right  Result Date: 08/04/2020 CLINICAL DATA:  Right foot ulcer, initial encounter EXAM: RIGHT FOOT COMPLETE - 3+ VIEW COMPARISON:  None. FINDINGS: No acute fracture or dislocation is noted. Soft tissue swelling is noted with focal ulceration dorsally identified consistent with the given history. No underlying bony erosive changes are seen at this time. IMPRESSION: Soft tissue swelling with  focal ulceration dorsally. No definitive bony erosive changes are seen. Electronically Signed   By: Inez Catalina M.D.   On: 08/04/2020 19:03   ECHOCARDIOGRAM COMPLETE  Result Date: 08/06/2020    ECHOCARDIOGRAM REPORT   Patient Name:   [name] Date of Exam: 08/05/2020 Medical Rec #:  [id number]         Height:       64.0 in Accession #:    [id number]        Weight:       203.7 lb Date of Birth:  [birth year]         BSA:          1.972 m Patient Age:    43 years          BP:           113/63 mmHg Patient Gender: F                 HR:           97 bpm. Exam Location:  [facility] Procedure: 2D Echo, Color Doppler and Cardiac Doppler Indications:     R78.81 Stroke  History:         Patient has prior history of Echocardiogram examinations, most                  recent 06/23/2020. CAD, Prior CABG, Stroke and CKD; Risk                  Factors:Diabetes, Hypertension and Dyslipidemia.  Sonographer:     Charmayne Sheer RDCS (AE) Referring Phys:  [id number] Sidney Ace Diagnosing Phys: Ida Rogue MD  Sonographer Comments: No subcostal window. IMPRESSIONS  1. Left ventricular ejection fraction, by estimation, is 60 to 65%. The left ventricle has normal function. The left ventricle has no regional wall motion abnormalities. Left ventricular diastolic parameters are consistent with Grade I diastolic dysfunction (impaired relaxation).  2. Right ventricular systolic function is normal. The right ventricular size is normal.  3. Left atrial size was mildly dilated. FINDINGS  Left Ventricle: Left ventricular ejection fraction, by estimation, is 60 to 65%. The left ventricle has normal function. The left ventricle has no regional wall motion abnormalities. The left ventricular internal cavity size was normal in size. There is  no left ventricular hypertrophy. Left ventricular diastolic parameters are consistent with Grade I diastolic dysfunction (impaired  relaxation). Right Ventricle: The right ventricular size is normal. No  increase in right ventricular wall thickness. Right ventricular systolic function is normal. Left Atrium: Left atrial size was mildly dilated. Right Atrium: Right atrial size was normal in size. Pericardium: There is no evidence of pericardial effusion. Mitral Valve: The mitral valve is normal in structure. No evidence of mitral valve regurgitation. No evidence of mitral valve stenosis. MV peak gradient, 4.2 mmHg. The mean mitral valve gradient is 2.0 mmHg. Tricuspid Valve: The tricuspid valve is normal in structure. Tricuspid valve regurgitation is not demonstrated. No evidence of tricuspid stenosis. Aortic Valve: The aortic valve is normal in structure. Aortic valve regurgitation is not visualized. Mild to moderate aortic valve sclerosis/calcification is present, without any evidence of aortic stenosis. Aortic valve mean gradient measures 8.0 mmHg. Aortic valve peak gradient measures 13.7 mmHg. Aortic valve area, by VTI measures 1.63 cm. Pulmonic Valve: The pulmonic valve was normal in structure. Pulmonic valve regurgitation is not visualized. No evidence of pulmonic stenosis. Aorta: The aortic root is normal in size and structure. Venous: The inferior vena cava is normal in size with greater than 50% respiratory variability, suggesting right atrial pressure of 3 mmHg. IAS/Shunts: No atrial level shunt detected by color flow Doppler.  LEFT VENTRICLE PLAX 2D LVIDd:         4.45 cm  Diastology LVIDs:         2.56 cm  LV e' medial:    6.42 cm/s LV PW:         1.03 cm  LV E/e' medial:  11.5 LV IVS:        0.90 cm  LV e' lateral:   6.74 cm/s LVOT diam:     1.90 cm  LV E/e' lateral: 10.9 LV SV:         51 LV SV Index:   26 LVOT Area:     2.84 cm  LEFT ATRIUM             Index LA diam:        3.80 cm 1.93 cm/m LA Vol (A2C):   49.0 ml 24.85 ml/m LA Vol (A4C):   70.9 ml 35.96 ml/m LA Biplane Vol: 59.3 ml 30.08 ml/m  AORTIC VALVE                    PULMONIC VALVE AV Area (Vmax):    1.53 cm     PV Vmax:       1.25 m/s  AV Area (Vmean):   1.53 cm     PV Vmean:      81.700 cm/s AV Area (VTI):     1.63 cm     PV VTI:        0.212 m AV Vmax:           185.00 cm/s  PV Peak grad:  6.2 mmHg AV Vmean:          138.000 cm/s PV Mean grad:  3.0 mmHg AV VTI:            0.314 m AV Peak Grad:      13.7 mmHg AV Mean Grad:      8.0 mmHg LVOT Vmax:         99.80 cm/s LVOT Vmean:        74.500 cm/s LVOT VTI:          0.181 m LVOT/AV VTI ratio: 0.58  AORTA Ao Root diam: 2.90 cm MITRAL VALVE MV Area (PHT):  5.66 cm     SHUNTS MV Peak grad:  4.2 mmHg     Systemic VTI:  0.18 m MV Mean grad:  2.0 mmHg     Systemic Diam: 1.90 cm MV Vmax:       1.02 m/s MV Vmean:      70.7 cm/s MV Decel Time: 134 msec MV E velocity: 73.70 cm/s MV A velocity: 104.00 cm/s MV E/A ratio:  0.71 Ida Rogue MD Electronically signed by Ida Rogue MD Signature Date/Time: 08/06/2020/7:24:42 AM    Final      Medications:   . piperacillin-tazobactam (ZOSYN)  IV 2.25 g (08/06/20 0531)   . sodium chloride   Intravenous Once  . amLODipine  5 mg Oral Daily  . calcium carbonate  2 tablet Oral BID  . Chlorhexidine Gluconate Cloth  6 each Topical Q0600  . clopidogrel  75 mg Oral Daily  . furosemide  20 mg Intravenous Once  . gabapentin  200 mg Oral BID  . insulin aspart  0-5 Units Subcutaneous QHS  . insulin aspart  0-9 Units Subcutaneous TID WC  . levothyroxine  150 mcg Oral Q0600  . metoprolol tartrate  50 mg Oral BID  . pneumococcal 23 valent vaccine  0.5 mL Intramuscular Tomorrow-1000  . torsemide  100 mg Oral Daily  . vancomycin variable dose per unstable renal function (pharmacist dosing)   Does not apply See admin instructions   diphenoxylate-atropine, loperamide, ondansetron (ZOFRAN) IV, oxyCODONE  Assessment/ Plan:  [name] is a 86 y.o.  female  well known to our practice. Patient was sent in to ED from dialysis clinic for bacteremia. Cultures growing Staph Warneri  [facility] TTS [place] IJ permcath 91.5kg  #ESRD on  dialysis TTS  Patient received dialysis treatment yesterday Volume and electrolyte status acceptable Rt.IJ Permcath discontinued yesterday due to bacteremia, allowing a catheter free period  # Hypertension Normotensive today Continue current regimen of antihypertensives  #Secondary Hyperparathyroidism with hypocalemia.  Continue Calcium Carbonate Will monitor bone mineral metabolism parameters  # Anemia of CKD Hemoglobin 6.9 today Planning for PRBC transfusion today Recommend premedication with Furosemide    LOS: 2 Jacquiline Zurcher 11/5/20215:08 PM

## 2020-08-06 NOTE — Progress Notes (Signed)
PT Cancellation Note  Patient Details Name: [name] MRN: [id number] DOB: 09-May-1934   Cancelled Treatment:    Reason Eval/Treat Not Completed: Other (comment) PT order received and chart reviewed. Pt's Hgb currently at 6.9 which is below recommended levels for exertional activity. Will attempt evaluation another date/time after pt has undergone blood transfusion.   [name] 08/06/2020, 10:27 AM

## 2020-08-06 NOTE — TOC Initial Note (Signed)
Transition of Care [facility]) - Initial/Assessment Note    Patient Details  Name: [name] MRN: [id number] Date of Birth: [birth year]  [facility]) CM/SW Contact:    Kerin Salen, RN Phone Number: 08/06/2020, 12:04 PM  Clinical Narrative:  Re-admission screen complete.  Patient cooperative pleasant, husband in room, currently with [facility] providing [facility], OT and Nursing. Patient uses [place] clinic, Dr. Doy Hutching, PCP. Husband provides transportation to appointments and grocery shopping. Patient cooks. Patient uses walker and BSC at home. Medications filled at [facility] in [place], husband picks up medication refills.               Expected Discharge Plan: [place] Barriers to Discharge: Continued Medical Work up   Patient Goals and CMS Choice     Choice offered to / list presented to : NA  Expected Discharge Plan and Services Expected Discharge Plan: [place] Acute Care Choice: Resumption of Svcs/PTA Provider Living arrangements for the past 2 months: Single Family Home                                      Prior Living Arrangements/Services Living arrangements for the past 2 months: Single Family Home Lives with:: Spouse Patient language and need for interpreter reviewed:: Yes Do you feel safe going back to the place where you live?: Yes      Need for Family Participation in Patient Care: Yes (Comment) Care giver support system in place?: Yes (comment) Current home services: DME, Home OT, Home PT, Home RN Criminal Activity/Legal Involvement Pertinent to Current Situation/Hospitalization: No - Comment as needed  Activities of Daily Living Home Assistive Devices/Equipment: None ADL Screening (condition at time of admission) Patient's cognitive ability adequate to safely complete daily activities?: Yes Is the patient deaf or have difficulty hearing?: No Does the patient have difficulty seeing,  even when wearing glasses/contacts?: No Does the patient have difficulty concentrating, remembering, or making decisions?: No Patient able to express need for assistance with ADLs?: Yes Does the patient have difficulty dressing or bathing?: No Independently performs ADLs?: Yes (appropriate for developmental age) Does the patient have difficulty walking or climbing stairs?: Yes Weakness of Legs: Right Weakness of Arms/Hands: None  Permission Sought/Granted Permission sought to share information with : Facility Sport and exercise psychologist, Family Supports Permission granted to share information with : Yes, Verbal Permission Granted  Share Information with NAME: Georgia Lopes  Permission granted to share info w AGENCY: [facility]  Permission granted to share info w Relationship: spouse  Permission granted to share info w Contact Information: [phone]  Emotional Assessment Appearance:: Appears stated age Attitude/Demeanor/Rapport: Gracious, Engaged Affect (typically observed): Accepting, Calm, Pleasant Orientation: : Oriented to Self, Oriented to Place, Oriented to  Time, Oriented to Situation Alcohol / Substance Use: Not Applicable Psych Involvement: No (comment)  Admission diagnosis:  Bacteremia [R78.81] Malaise [R53.81] Wound infection [T14.8XXA, L08.9] ESRD (end stage renal disease) on dialysis ([place]) [N18.6, Z99.2] Patient Active Problem List   Diagnosis Date Noted  . Pressure injury of skin 08/05/2020  . Wound infection 08/04/2020  . History of CVA with residual deficit 08/04/2020  . Ambulatory dysfunction 08/04/2020  . Anemia of chronic kidney failure, stage 5 ([place]) 08/04/2020  . ESRD on hemodialysis ([place]) 07/16/2020  . Symptomatic anemia 07/01/2020  . Hyperkalemia 06/22/2020  . Prolonged QT interval 06/22/2020  .  Stroke ([place]) 06/01/2020  . CAD (coronary artery disease) 06/01/2020  . Thyroid disease 06/01/2020  . CKD (chronic kidney disease) stage 5, GFR less than 15 ml/min  (HCC) 06/01/2020  . Carotid stenosis 06/01/2020  . Pain syndrome, chronic 02/16/2020  . Weakness 08/30/2016  . Acute renal failure (ARF) ([place]) 05/04/2016  . Type 2 diabetes with nephropathy ([place]) 09/02/2015  . HTN, goal below 140/80 08/20/2015  . Diabetes mellitus type 2, uncomplicated ([place]) 97/67/3419  . Cerebral embolism with cerebral infarction ([place]) 09/07/2012  . Hemiplegia, unspecified, affecting dominant side 09/07/2012  . 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 Coronary artery disease 08/22/2012  . Hyperlipidemia 08/19/2012  . Diabetes mellitus ([place]) 08/19/2012  . Hypertension 08/19/2012  . Breast cancer ([place]) 08/19/2012  . GERD (gastroesophageal reflux disease) 08/19/2012   PCP:  Idelle Crouch, MD Pharmacy:   [facility] [address], [place] - [place] [address] Phone: [phone] Fax: [phone]  [facility] [place], [place] [address] [place] [address] Phone: [phone] Fax: [place], [place] [address] [place] [address] Phone: [phone] Fax: [place], [place] [address] Phone: [phone] Fax: [phone]     Social Determinants of Health (SDOH) Interventions    Readmission Risk Interventions Readmission Risk Prevention Plan 08/06/2020  Transportation Screening Complete  PCP or Specialist Appt within 3-5 Days Complete  HRI or [place] Complete  Social Work Consult for [place] Planning/Counseling Complete  Palliative Care Screening Not Applicable  Medication Review Press photographer) Complete  Some recent data might be hidden

## 2020-08-06 NOTE — Evaluation (Signed)
Occupational Therapy Evaluation Patient Details Name: [name] MRN: [id number] DOB: 05/24/1934 Today's Date: 08/06/2020    History of Present Illness [name] is a 86 y.o. 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 Holley Raring due to concern for bloodstream infection per patient's report.   Clinical Impression   Pt was seen for OT evaluation this date. Prior to hospital admission, pt was living at home with her husband, who completes most [place] IADL. Pt was Mod I in grooming, toileting. Husband assists with LB dressing and bed mobility, given pt's weakness/limited mobility in RLE. Pt reports sponge bathing rather than showering. She uses a RW and BSC at home, and reports rarely leaving her house. She recalls having 2 falls in the previous 6 months. She is also visually impaired, reporting macular degeneration. Pt's current impairments functionally limit her ability to perform ADL/self-care tasks. Pt currently requires ModA for bed mobility, CGA for stand/pivot transfers, using a RW. Sitting and standing balance fair. Pt denies pain today, is A&O x 4, with flat affect. She would benefit from skilled OT services to address decreased strength and activity tolerance, impaired balance, and increased edema. Post DC, recommend HHOT to address falls risk and increased pt's independence w/ functional mobility. Pt expresses a particular desire to feel confident with her ability to shower safely at home.       Follow Up Recommendations  Home health OT    Equipment Recommendations  None recommended  by OT    Recommendations for Other Services       Precautions / Restrictions Precautions Precautions: Fall Restrictions Weight Bearing Restrictions: No      Mobility Bed Mobility Overal bed mobility: Needs Assistance Bed Mobility: Supine to Sit     Supine to sit: Mod assist     General bed mobility comments: requires ModA for moving RLE and for bringing trunk into sit    Transfers Overall transfer level: Needs assistance Equipment used: Rolling walker (2 wheeled) Transfers: Sit to/from Stand Sit to Stand: Min guard              Balance Overall balance assessment: Needs assistance Sitting-balance support: Feet supported;Bilateral upper extremity supported Sitting balance-Leahy Scale: Fair     Standing balance support: Bilateral upper extremity supported Standing balance-Leahy Scale: Fair                             ADL either performed or assessed with clinical judgement   ADL Overall ADL's : Needs assistance/impaired Eating/Feeding: Set up;Modified independent Eating/Feeding Details (indicate cue type and reason): lots of spilling of food onto gown Grooming: Wash/dry hands;Wash/dry face;Set up;Modified independent;Bed level                               Functional mobility during ADLs: Minimal assistance;Rolling walker       Vision Baseline Vision/History: Macular Degeneration Patient Visual Report: No change from baseline Additional Comments: Pt reports macular degeneration, with declining/poor vision     Perception     Praxis  Pertinent Vitals/Pain       Hand Dominance Right   Extremity/Trunk Assessment Upper Extremity Assessment Upper Extremity Assessment: Generalized weakness   Lower Extremity Assessment Lower Extremity Assessment: Generalized weakness   Cervical / Trunk Assessment Cervical / Trunk Assessment: Normal   Communication Communication Communication: No difficulties   Cognition  Arousal/Alertness: Awake/alert Behavior During Therapy: WFL for tasks assessed/performed;Flat affect Overall Cognitive Status: Within Functional Limits for tasks assessed                                     General Comments  Considerable edema in BLE, although MD and RN in room report that swelling reduced since yesterday    Exercises Other Exercises Other Exercises: Educ re: role of OT, POC, EC   Shoulder Instructions      Home Living Family/patient expects to be discharged to:: Private residence Living Arrangements: Spouse/significant other Available Help at Discharge: Family;Available 24 hours/day Type of Home: House Home Access: Stairs to enter [employer] of Steps: 1 Entrance Stairs-Rails: Can reach both Home Layout: One level     Bathroom Shower/Tub: Teacher, early years/pre: Standard Bathroom Accessibility: Yes How Accessible: Accessible via walker Home Equipment: [place] - 2 wheels;Bedside commode;Shower seat          Prior Functioning/Environment Level of Independence: Needs assistance  Gait / Transfers Assistance Needed: Pt states that she has a 2WW she uses at home, at all times. ADL's / Homemaking Assistance Needed: Pt states that she is able to dress and toilet herself. For past 2 months she has been doing only sponge baths. Husband helps her with bed mobility. He drives, does the shopping, and most [place] IADL. Pt reports that she almost never goes out of the house.            OT Problem List: Decreased strength;Impaired balance (sitting and/or standing);Decreased activity tolerance;Impaired vision/perception;Decreased safety awareness;Impaired sensation;Increased edema      OT Treatment/Interventions: Self-care/ADL training;DME and/or AE instruction;Therapeutic activities;Balance training;Therapeutic exercise;Neuromuscular education;Energy conservation;Patient/family education    OT Goals(Current goals can be found in the  care plan section) Acute Rehab OT Goals Patient Stated Goal: to be home before Monday OT Goal Formulation: With patient Time For Goal Achievement: 08/20/20 Potential to Achieve Goals: Good ADL Goals Pt Will Perform Upper Body Bathing: with min guard assist;with set-up Pt Will Perform Upper Body Dressing: with min assist;with set-up Pt Will Perform Tub/Shower Transfer: with min assist;shower seat;Stand pivot transfer (using LRAD)  OT Frequency: Min 1X/week   Barriers to D/C:            Co-evaluation              AM-PAC OT "6 Clicks" Daily Activity     Outcome Measure Help from another person eating meals?: None Help from another person taking care of personal grooming?: A Little Help from another person toileting, which includes using toliet, bedpan, or urinal?: A Little Help from another person bathing (including washing, rinsing, drying)?: A Little Help from another person to put on and taking off regular upper body clothing?: A Little Help from another person to put on and taking off regular lower body clothing?: A Lot 6 Click Score: 18   End of Session Equipment Utilized During Treatment: Rolling walker  Activity Tolerance: Patient tolerated treatment well Patient left: in chair;with nursing/sitter in room;with family/visitor present;with call bell/phone within reach;with chair alarm set  OT  Visit Diagnosis: Unsteadiness on feet (R26.81);Low vision, both eyes (H54.2);Muscle weakness (generalized) (M62.81);History of falling (Z91.81);Other abnormalities of gait and mobility (R26.89)                Time: 9957-9009 OT Time Calculation (min): 26 min Charges:  OT General Charges $OT Visit: 1 Visit OT Evaluation $OT Eval Moderate Complexity: 1 Mod OT Treatments $Self Care/Home Management : 23-37 mins   Josiah Lobo, PhD, MS, OTR/L ascom [phone] 08/06/20, 11:21 AM

## 2020-08-06 NOTE — Progress Notes (Signed)
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  Results of outpatient blood culture were sent to the inpatient nephrology consultant Dr. Juleen China.  We will attempt to have these uploaded into care everywhere.  Preliminary results from blood cultures growing Staphylococcus warneri.  Concerning resistance pattern but is sensitive to vancomycin which she is on  Regarding her foot wound wound and ostomy has evaluated in place dressing.  I have taken a picture and included in the medical record.  I have requested consultation from Dr. Vickki Muff of podiatry.  Will defer any further imaging after surgical evaluation.  11/4: Consultations obtained with infectious disease and podiatry.  Per podiatry no acute need for surgical management.  Recommending outpatient biopsy to rule out pyoderma gangrenosum and consideration for outpatient wound VAC.  Per ID source may be related to indwelling dialysis catheter.  Recommending dialysis catheter removal.  Communicated with nephrology, plan to remove dialysis catheter after HD today.  No fevers noted over interval.  All cultures on admission remain negative.   11/5: Indwelling catheter removed 08/05/2020.  Patient had some anemia and thrombocytopenia noted post catheter removal.  No bleeding noted.  Catheter site looks clean.  All cultures remain negative to date.   Assessment & Plan:   Principal Problem:   Wound infection Active Problems:   Hyperlipidemia   Hypertension   GERD (gastroesophageal reflux disease)   S/P CABG x 4   CAD (coronary artery disease)   Type 2 diabetes with nephropathy (HCC)   ESRD on  hemodialysis ([facility])   History of CVA with residual deficit   Ambulatory dysfunction   Anemia of chronic kidney failure, stage 5 (HCC)   Pressure injury of skin  Dorsal foot wound -Wound culture from 10/15 grew Pseudomonas aeruginosa, Enterococcus faecalis and staph aureus -Etiology of wound is unclear -Infectious disease on consult -Podiatry on consult, no acute surgical management Plan: Continue vancomycin and Zosyn for now Continue wound care Extremity elevation Pain control as needed  Staph warneri bacteremia Noted on outpatient blood culture Resistant to oxacillin Currently on vancomycin No fevers over interval Surveillance cultures not demonstrating growth Indwelling catheter removed 08/05/2020 2D echocardiogram with no evidence of vegetation Plan: Continue vancomycin for now Follow surveillance cultures Appreciate ID follow-up Plan to give patient a line holiday over the weekend and replace PermCath on Monday, 08/09/2020    Hypertension -Stable -Continue amlodipine, metoprolol    GERD (gastroesophageal reflux disease) -Chronic and stable.  Continue famotidine    S/P CABG x 4   CAD (coronary artery disease) -Chronic and stable with no complaints of chest pain -Continue aspirin, Plavix, metoprolol.  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  Not currently on statin per med list -Patient ambulate with a cane    Anemia of chronic kidney failure, stage 5 (HCC) Thrombocytopenia Worsened after vascular procedure Initial hemoglobin 6.9, repeat hemoglobin 7.3 No bleeding noted Defer transfusion at this time Monitor  for any bleeding Check daily CBC Hold chemoprophylaxis     DVT  prophylaxis: Subcutaneous heparin Code Status: Full code Family Communication: Husband at bedside Disposition Plan: Status is: Inpatient  Remains inpatient appropriate because:IV treatments appropriate due to intensity of illness or inability to take PO and Inpatient level of care appropriate due to severity of illness   Dispo: The patient is from: Home              Anticipated d/c is to: Home              Anticipated d/c date is: > 3 days              Patient currently is not medically stable to d/c.   Staphylococcal bacteremia.  Suspected source hemodialysis catheter.  Catheter has been removed.  Plan to monitor patient in house over the weekend with plans to replace line on 08/09/2020  Consultants:   Nephrology  Infectious disease  Podiatry  Vascular surgery  Procedures:   None  Antimicrobials:   Vancomycin  Zosyn   Subjective: Seen and examined.  Husband at bedside.  No pain complaints.  Expresses frustration about continued hospital stay.  Objective: Vitals:   08/06/20 0303 08/06/20 0814 08/06/20 1153 08/06/20 1155  BP: (!) 116/45 (!) 103/49 (!) 89/48 (!) 104/43  Pulse: 97 95 92 92  Resp: 20  20   Temp: 98.4 F (36.9 C) 98.9 F (37.2 C) 98.7 F (37.1 C)   TempSrc: Oral Oral Oral   SpO2: 93% 91% 95%   Weight:      Height:        Intake/Output Summary (Last 24 hours) at 08/06/2020 1415 Last data filed at 08/05/2020 1957 Gross per 24 hour  Intake 240 ml  Output 100 ml  Net 140 ml   Filed Weights   08/03/20 1554 08/04/20 0602  Weight: 84.4 kg 92.4 kg    Examination:  General exam: Appears calm and comfortable  Respiratory system: Clear to auscultation. Respiratory effort normal. Cardiovascular system: S1 & S2 heard, RRR. No JVD, murmurs, rubs, gallops or clicks. No pedal edema. Gastrointestinal system: Hyperactive bowel sounds, soft, nondistended, no masses Central nervous system: Alert and oriented. No focal neurological deficits. Extremities:  Symmetric 5 x 5 power. Skin:  5 x 7 cm ulcerative wound to dorsal aspect of right midfoot Psychiatry: Judgement and insight appear normal. Mood & affect appropriate.     Data Reviewed: I have personally reviewed following labs and imaging studies  CBC: Recent Labs  Lab 08/03/20 1557 08/04/20 0714 08/05/20 0817 08/06/20 0535 08/06/20 1246  WBC 8.8 8.5 6.9 4.5  --   NEUTROABS  --   --  4.8 3.0  --   HGB 8.6* 9.0* 7.8* 6.9* 7.3*  HCT 27.0* 28.4* 25.0* 22.4*  --   MCV 97.1 98.6 97.7 97.8  --   PLT 163 132* 116* 92*  --    Basic Metabolic Panel: Recent Labs  Lab 08/03/20 1557 08/04/20 0714 08/05/20 0817 08/06/20 0535  NA 133*  --  134*  134* 132*  K 4.7  --  4.1  4.1 4.0  CL 96*  --  99  100 98  CO2 26  --  24  23 25   GLUCOSE 272*  --  90  97 109*  BUN 37*  --  54*  57* 29*  CREATININE 2.82* 3.33* 3.95*  3.89* 3.27*  CALCIUM 6.7*  --  6.3*  6.4* 6.7*  PHOS  --   --  4.3  --    GFR: Estimated Creatinine Clearance: 14.4 mL/min (A) (by C-G formula based on SCr of 3.27 mg/dL (H)). Liver Function Tests: Recent Labs  Lab 08/03/20 1557 08/05/20 0817  AST 12*  --   ALT 7  --   ALKPHOS 68  --   BILITOT 0.8  --   PROT 6.5  --   ALBUMIN 2.9* 2.2*   Recent Labs  Lab 08/03/20 1557  LIPASE 34   No results for input(s): AMMONIA in the last 168 hours. Coagulation Profile: No results for input(s): INR, PROTIME in the last 168 hours. Cardiac Enzymes: No results for input(s): CKTOTAL, CKMB, CKMBINDEX, TROPONINI in the last 168 hours. BNP (last 3 results) No results for input(s): PROBNP in the last 8760 hours. HbA1C: No results for input(s): HGBA1C in the last 72 hours. CBG: Recent Labs  Lab 08/05/20 0749 08/05/20 1439 08/05/20 2103 08/06/20 0815 08/06/20 1154  GLUCAP 93 87 147* 103* 126*   Lipid Profile: No results for input(s): CHOL, HDL, LDLCALC, TRIG, CHOLHDL, LDLDIRECT in the last 72 hours. Thyroid Function Tests: No results for input(s): TSH,  T4TOTAL, FREET4, T3FREE, THYROIDAB in the last 72 hours. Anemia Panel: No results for input(s): VITAMINB12, FOLATE, FERRITIN, TIBC, IRON, RETICCTPCT in the last 72 hours. Sepsis Labs: Recent Labs  Lab 08/04/20 0309  PROCALCITON 0.41  LATICACIDVEN 1.2    Recent Results (from the past 240 hour(s))  Culture, blood (routine x 2)     Status: None (Preliminary result)   Collection Time: 08/04/20  3:09 AM   Specimen: BLOOD  Result Value Ref Range Status   Specimen Description BLOOD BLOOD LEFT ARM  Final   Special Requests   Final    BOTTLES DRAWN AEROBIC AND ANAEROBIC Blood Culture results may not be optimal due to an inadequate volume of blood received in culture bottles   Culture   Final    NO GROWTH 2 DAYS Performed at [facility], [address] [place], [place] [address]    Report Status PENDING  Incomplete  Culture, blood (routine x 2)     Status: None (Preliminary result)   Collection Time: 08/04/20  3:09 AM   Specimen: BLOOD  Result Value Ref Range Status   Specimen Description BLOOD LEFT ANTECUBITAL  Final   Special Requests   Final    BOTTLES DRAWN AEROBIC AND ANAEROBIC Blood Culture results may not be optimal due to an inadequate volume of blood received in culture bottles   Culture   Final    NO GROWTH 2 DAYS Performed at [facility], [address], [place], [place] [address]    Report Status PENDING  Incomplete  Respiratory Panel by RT PCR (Flu A&B, Covid) - Nasopharyngeal Swab     Status: None   Collection Time: 08/04/20  3:09 AM   Specimen: Nasopharyngeal Swab  Result Value Ref Range Status   SARS Coronavirus 2 by RT PCR NEGATIVE NEGATIVE Final    Comment: (NOTE) SARS-CoV-2 target nucleic acids are NOT DETECTED.  The SARS-CoV-2 RNA is generally detectable in upper respiratoy specimens during the acute phase of infection. The lowest concentration of SARS-CoV-2 viral copies this assay can detect is 131 copies/mL. A negative result does not  preclude SARS-Cov-2 infection and should not be used as the sole basis for treatment or other patient management decisions. A negative result may occur with  improper specimen collection/handling, submission of specimen other than nasopharyngeal swab, presence of viral mutation(s) within the areas targeted  by this assay, and inadequate number of viral copies (<131 copies/mL). A negative result must be combined with clinical observations, patient history, and epidemiological information. The expected result is Negative.  Fact Sheet for Patients:  PinkCheek.be  Fact Sheet for Healthcare Providers:  GravelBags.it  This test is no t yet approved or cleared by the [place] FDA and  has been authorized for detection and/or diagnosis of SARS-CoV-2 by FDA under an Emergency Use Authorization (EUA). This EUA will remain  in effect (meaning this test can be used) for the duration of the COVID-19 declaration under Section 564(b)(1) of the Act, 21 U.S.C. section 360bbb-3(b)(1), unless the authorization is terminated or revoked sooner.     Influenza A by PCR NEGATIVE NEGATIVE Final   Influenza B by PCR NEGATIVE NEGATIVE Final    Comment: (NOTE) The Xpert Xpress SARS-CoV-2/FLU/RSV assay is intended as an aid in  the diagnosis of influenza from Nasopharyngeal swab specimens and  should not be used as a sole basis for treatment. Nasal washings and  aspirates are unacceptable for Xpert Xpress SARS-CoV-2/FLU/RSV  testing.  Fact Sheet for Patients: PinkCheek.be  Fact Sheet for Healthcare Providers: GravelBags.it  This test is not yet approved or cleared by the [place] FDA and  has been authorized for detection and/or diagnosis of SARS-CoV-2 by  FDA under an Emergency Use Authorization (EUA). This EUA will remain  in effect (meaning this test can be used) for the  duration of the  Covid-19 declaration under Section 564(b)(1) of the Act, 21  U.S.C. section 360bbb-3(b)(1), unless the authorization is  terminated or revoked. Performed at [facility], [address] [place], [place] [address]   CULTURE, BLOOD (ROUTINE X 2) w Reflex to ID Panel     Status: None (Preliminary result)   Collection Time: 08/06/20 12:17 AM   Specimen: BLOOD  Result Value Ref Range Status   Specimen Description BLOOD BLOOD LEFT HAND  Final   Special Requests   Final    BOTTLES DRAWN AEROBIC AND ANAEROBIC Blood Culture results may not be optimal due to an inadequate volume of blood received in culture bottles   Culture   Final    NO GROWTH < 12 HOURS Performed at [facility], [address] [place], [place] [address]    Report Status PENDING  Incomplete         Radiology Studies: PERIPHERAL VASCULAR CATHETERIZATION  Result Date: 08/05/2020 See op note  DG Foot Complete Right  Result Date: 08/04/2020 CLINICAL DATA:  Right foot ulcer, initial encounter EXAM: RIGHT FOOT COMPLETE - 3+ VIEW COMPARISON:  None. FINDINGS: No acute fracture or dislocation is noted. Soft tissue swelling is noted with focal ulceration dorsally identified consistent with the given history. No underlying bony erosive changes are seen at this time. IMPRESSION: Soft tissue swelling with focal ulceration dorsally. No definitive bony erosive changes are seen. Electronically Signed   By: Inez Catalina M.D.   On: 08/04/2020 19:03   ECHOCARDIOGRAM COMPLETE  Result Date: 08/06/2020    ECHOCARDIOGRAM REPORT   Patient Name:   PHILIP ECKERSLEY Date of Exam: 08/05/2020 Medical Rec #:  [id number]         Height:       64.0 in Accession #:    [id number]        Weight:       203.7 lb Date of Birth:  [birth year]         BSA:  1.972 m Patient Age:    79 years          BP:           113/63 mmHg Patient Gender: F                 HR:           97 bpm. Exam Location:  [facility] Procedure: 2D  Echo, Color Doppler and Cardiac Doppler Indications:     R78.81 Stroke  History:         Patient has prior history of Echocardiogram examinations, most                  recent 06/23/2020. CAD, Prior CABG, Stroke and CKD; Risk                  Factors:Diabetes, Hypertension and Dyslipidemia.  Sonographer:     Charmayne Sheer RDCS (AE) Referring Phys:  [id number] Sidney Ace Diagnosing Phys: Ida Rogue MD  Sonographer Comments: No subcostal window. IMPRESSIONS  1. Left ventricular ejection fraction, by estimation, is 60 to 65%. The left ventricle has normal function. The left ventricle has no regional wall motion abnormalities. Left ventricular diastolic parameters are consistent with Grade I diastolic dysfunction (impaired relaxation).  2. Right ventricular systolic function is normal. The right ventricular size is normal.  3. Left atrial size was mildly dilated. FINDINGS  Left Ventricle: Left ventricular ejection fraction, by estimation, is 60 to 65%. The left ventricle has normal function. The left ventricle has no regional wall motion abnormalities. The left ventricular internal cavity size was normal in size. There is  no left ventricular hypertrophy. Left ventricular diastolic parameters are consistent with Grade I diastolic dysfunction (impaired relaxation). Right Ventricle: The right ventricular size is normal. No increase in right ventricular wall thickness. Right ventricular systolic function is normal. Left Atrium: Left atrial size was mildly dilated. Right Atrium: Right atrial size was normal in size. Pericardium: There is no evidence of pericardial effusion. Mitral Valve: The mitral valve is normal in structure. No evidence of mitral valve regurgitation. No evidence of mitral valve stenosis. MV peak gradient, 4.2 mmHg. The mean mitral valve gradient is 2.0 mmHg. Tricuspid Valve: The tricuspid valve is normal in structure. Tricuspid valve regurgitation is not demonstrated. No evidence of tricuspid  stenosis. Aortic Valve: The aortic valve is normal in structure. Aortic valve regurgitation is not visualized. Mild to moderate aortic valve sclerosis/calcification is present, without any evidence of aortic stenosis. Aortic valve mean gradient measures 8.0 mmHg. Aortic valve peak gradient measures 13.7 mmHg. Aortic valve area, by VTI measures 1.63 cm. Pulmonic Valve: The pulmonic valve was normal in structure. Pulmonic valve regurgitation is not visualized. No evidence of pulmonic stenosis. Aorta: The aortic root is normal in size and structure. Venous: The inferior vena cava is normal in size with greater than 50% respiratory variability, suggesting right atrial pressure of 3 mmHg. IAS/Shunts: No atrial level shunt detected by color flow Doppler.  LEFT VENTRICLE PLAX 2D LVIDd:         4.45 cm  Diastology LVIDs:         2.56 cm  LV e' medial:    6.42 cm/s LV PW:         1.03 cm  LV E/e' medial:  11.5 LV IVS:        0.90 cm  LV e' lateral:   6.74 cm/s LVOT diam:     1.90 cm  LV E/e' lateral: 10.9  LV SV:         51 LV SV Index:   26 LVOT Area:     2.84 cm  LEFT ATRIUM             Index LA diam:        3.80 cm 1.93 cm/m LA Vol (A2C):   49.0 ml 24.85 ml/m LA Vol (A4C):   70.9 ml 35.96 ml/m LA Biplane Vol: 59.3 ml 30.08 ml/m  AORTIC VALVE                    PULMONIC VALVE AV Area (Vmax):    1.53 cm     PV Vmax:       1.25 m/s AV Area (Vmean):   1.53 cm     PV Vmean:      81.700 cm/s AV Area (VTI):     1.63 cm     PV VTI:        0.212 m AV Vmax:           185.00 cm/s  PV Peak grad:  6.2 mmHg AV Vmean:          138.000 cm/s PV Mean grad:  3.0 mmHg AV VTI:            0.314 m AV Peak Grad:      13.7 mmHg AV Mean Grad:      8.0 mmHg LVOT Vmax:         99.80 cm/s LVOT Vmean:        74.500 cm/s LVOT VTI:          0.181 m LVOT/AV VTI ratio: 0.58  AORTA Ao Root diam: 2.90 cm MITRAL VALVE MV Area (PHT): 5.66 cm     SHUNTS MV Peak grad:  4.2 mmHg     Systemic VTI:  0.18 m MV Mean grad:  2.0 mmHg     Systemic Diam: 1.90  cm MV Vmax:       1.02 m/s MV Vmean:      70.7 cm/s MV Decel Time: 134 msec MV E velocity: 73.70 cm/s MV A velocity: 104.00 cm/s MV E/A ratio:  0.71 Ida Rogue MD Electronically signed by Ida Rogue MD Signature Date/Time: 08/06/2020/7:24:42 AM    Final         Scheduled Meds: . sodium chloride   Intravenous Once  . amLODipine  5 mg Oral Daily  . calcium carbonate  2 tablet Oral BID  . Chlorhexidine Gluconate Cloth  6 each Topical Q0600  . clopidogrel  75 mg Oral Daily  . furosemide  20 mg Intravenous Once  . gabapentin  200 mg Oral BID  . insulin aspart  0-5 Units Subcutaneous QHS  . insulin aspart  0-9 Units Subcutaneous TID WC  . levothyroxine  150 mcg Oral Q0600  . metoprolol tartrate  50 mg Oral BID  . pneumococcal 23 valent vaccine  0.5 mL Intramuscular Tomorrow-1000  . torsemide  100 mg Oral Daily   Continuous Infusions: . piperacillin-tazobactam (ZOSYN)  IV 2.25 g (08/06/20 0531)  . vancomycin 1,000 mg (08/05/20 1428)     LOS: 2 days    Time spent: 25 minutes    Sidney Ace, MD [facility] Pager [phone]  If 7PM-7AM, please contact night-coverage 08/06/2020, 2:15 PM

## 2020-08-06 NOTE — Progress Notes (Signed)
Pharmacy Antibiotic Note  [name] is a 86 y.o. female admitted on 08/04/2020 with cellulitis to dorsal aspect of right foot followed at the wound care clinic. Pharmacy was consulted for vancomycin and Zosyn dosing. Pt is on HD every T-Th-Sat. She has a h/o cellulitis with wound cultures containing multiple organisms (pseudomonas, enterococcus, MSSA). Outpatient blood culture reported positive for Staph warneri resistant to oxacillin. Potential source could be dialysis catheter which was removed 11/4. Bcx from 11/3 and 11/5 currently NGTD.   Plan: 1) Patient does not have port access at this time; MD planning to replace PermCath on Monday (11/8). Will do variable vancomycin dosing based on levels. Will order random level for 11/7  2) Continue Zosyn 2.25 grams IV every 8 hours   Height: 5\' 4"  (162.6 cm) Weight: 92.4 kg (203 lb 11.3 oz) IBW/kg (Calculated) : 54.7  Temp (24hrs), Avg:98.7 F (37.1 C), Min:98.4 F (36.9 C), Max:98.9 F (37.2 C)  Recent Labs  Lab 08/03/20 1557 08/04/20 0309 08/04/20 0714 08/05/20 0817 08/06/20 0535  WBC 8.8  --  8.5 6.9 4.5  CREATININE 2.82*  --  3.33* 3.95*  3.89* 3.27*  LATICACIDVEN  --  1.2  --   --   --     Estimated Creatinine Clearance: 14.4 mL/min (A) (by C-G formula based on SCr of 3.27 mg/dL (H)).    Allergies  Allergen Reactions  . Clonidine Other (See Comments) and Shortness Of Breath    Other reaction(s): Other (see comments) Other reaction(s): Other (See Comments)  . Darvocet [Propoxyphene N-Acetaminophen] Anaphylaxis  . Hydralazine     Other reaction(s): Other (See Comments) glomerulonephritis  . Hydralazine Hcl Other (See Comments)    glomerulonephritis  . Esomeprazole     Other reaction(s): Unknown  . Guaifenesin     Other reaction(s): Unknown Other reaction(s): Unknown Other reaction(s): Unknown Other reaction(s): Unknown  . Phenylephrine-Guaifenesin     Other reaction(s): Unknown  . Rabeprazole     Other  reaction(s): Unknown  . Telithromycin     Other reaction(s): Unknown Other reaction(s): Unknown Other reaction(s): Unknown  . Fluocinolone Other (See Comments)    Feels crazy Other reaction(s): Other (see comments) Other reaction(s): Other (See Comments) Feels crazy Feels crazy  . Iodinated Diagnostic Agents Nausea And Vomiting and Other (See Comments)    Burning Other reaction(s): Other (see comments) Other reaction(s): Other (See Comments) Burning Nausea & vomiting  . Levaquin [Levofloxacin] Nausea Only  . Statins Other (See Comments)    Myalgia Other reaction(s): Other (See Comments), Unknown, Unknown Myalgia    Antimicrobials this admission: ceftazidime x 1 vancomycin 11/3 >>  Zosyn 11/3  >>   Microbiology results: 11/3, 11/5 BCx: NGTD 10/21 Pseudomonas, E faecalis, S aureus 11/3 SARS CoV-2: negative 11/3 influenza A/B: negative    Thank you for allowing pharmacy to be a part of this patient's care.  Sherilyn Banker, PharmD Pharmacy Resident  08/06/2020 10:25 AM

## 2020-08-06 NOTE — Progress Notes (Signed)
MD notified of patient's Hgb and platelet levels. Ordered to hold AM subq Heparin. Pt is currently resting in the bed with no complaints.

## 2020-08-06 NOTE — Consult Note (Signed)
[facility] Nurse wound follow up Wound type:venous wound to right dorsal foot. Edema to lower leg Measurement: 7 cm x 5 cm x 3 cm  Wound bed:10% dark necrotic at distal end and 90% ruddy red Drainage (amount, consistency, odor) moderate purulence from wound bed. Musty odor.  Periwound:edema Dressing procedure/placement/frequency:Cleanse legs with soap and water and pat dry. Aquacel Ag to wound bed  Cover with dry dressing  Unna boot to bilateral lower legs.  Change on Tuesday and Friday.  Will follow.  Domenic Moras MSN, RN, FNP-BC [facility]ON Wound, Ostomy, Continence Nurse Pager [phone]

## 2020-08-07 DIAGNOSIS — R7881 Bacteremia: Secondary | ICD-10-CM | POA: Diagnosis not present

## 2020-08-07 DIAGNOSIS — B958 Unspecified staphylococcus as the cause of diseases classified elsewhere: Secondary | ICD-10-CM | POA: Diagnosis not present

## 2020-08-07 DIAGNOSIS — S91301A Unspecified open wound, right foot, initial encounter: Secondary | ICD-10-CM | POA: Diagnosis not present

## 2020-08-07 DIAGNOSIS — N186 End stage renal disease: Secondary | ICD-10-CM | POA: Diagnosis not present

## 2020-08-07 DIAGNOSIS — T148XXA Other injury of unspecified body region, initial encounter: Secondary | ICD-10-CM | POA: Diagnosis not present

## 2020-08-07 DIAGNOSIS — L089 Local infection of the skin and subcutaneous tissue, unspecified: Secondary | ICD-10-CM | POA: Diagnosis not present

## 2020-08-07 LAB — GLUCOSE, CAPILLARY
Glucose-Capillary: 108 mg/dL — ABNORMAL HIGH (ref 70–99)
Glucose-Capillary: 118 mg/dL — ABNORMAL HIGH (ref 70–99)
Glucose-Capillary: 119 mg/dL — ABNORMAL HIGH (ref 70–99)
Glucose-Capillary: 166 mg/dL — ABNORMAL HIGH (ref 70–99)
Glucose-Capillary: 91 mg/dL (ref 70–99)
Glucose-Capillary: 92 mg/dL (ref 70–99)
Glucose-Capillary: 96 mg/dL (ref 70–99)

## 2020-08-07 LAB — CBC WITH DIFFERENTIAL/PLATELET
Abs Immature Granulocytes: 0.04 10*3/uL (ref 0.00–0.07)
Basophils Absolute: 0 10*3/uL (ref 0.0–0.1)
Basophils Relative: 0 %
Eosinophils Absolute: 0.2 10*3/uL (ref 0.0–0.5)
Eosinophils Relative: 3 %
HCT: 23.9 % — ABNORMAL LOW (ref 36.0–46.0)
Hemoglobin: 7.4 g/dL — ABNORMAL LOW (ref 12.0–15.0)
Immature Granulocytes: 1 %
Lymphocytes Relative: 28 %
Lymphs Abs: 1.3 10*3/uL (ref 0.7–4.0)
MCH: 29.8 pg (ref 26.0–34.0)
MCHC: 31 g/dL (ref 30.0–36.0)
MCV: 96.4 fL (ref 80.0–100.0)
Monocytes Absolute: 0.3 10*3/uL (ref 0.1–1.0)
Monocytes Relative: 7 %
Neutro Abs: 2.8 10*3/uL (ref 1.7–7.7)
Neutrophils Relative %: 61 %
Platelets: 92 10*3/uL — ABNORMAL LOW (ref 150–400)
RBC: 2.48 MIL/uL — ABNORMAL LOW (ref 3.87–5.11)
RDW: 17.3 % — ABNORMAL HIGH (ref 11.5–15.5)
Smear Review: DECREASED
WBC: 4.7 10*3/uL (ref 4.0–10.5)
nRBC: 0 % (ref 0.0–0.2)

## 2020-08-07 LAB — BASIC METABOLIC PANEL
Anion gap: 11 (ref 5–15)
BUN: 40 mg/dL — ABNORMAL HIGH (ref 8–23)
CO2: 27 mmol/L (ref 22–32)
Calcium: 7 mg/dL — ABNORMAL LOW (ref 8.9–10.3)
Chloride: 97 mmol/L — ABNORMAL LOW (ref 98–111)
Creatinine, Ser: 4.43 mg/dL — ABNORMAL HIGH (ref 0.44–1.00)
GFR, Estimated: 9 mL/min — ABNORMAL LOW (ref >60)
Glucose, Bld: 98 mg/dL (ref 70–99)
Potassium: 3.9 mmol/L (ref 3.5–5.1)
Sodium: 135 mmol/L (ref 135–145)

## 2020-08-07 MED ORDER — NEPRO/CARBSTEADY PO LIQD
237.0000 mL | Freq: Two times a day (BID) | ORAL | Status: DC
  Administered 2020-08-08 – 2020-08-11 (×3): 237 mL via ORAL

## 2020-08-07 MED ORDER — EPOETIN ALFA 10000 UNIT/ML IJ SOLN
4000.0000 [IU] | INTRAMUSCULAR | Status: DC
  Administered 2020-08-10: 4000 [IU] via INTRAVENOUS

## 2020-08-07 NOTE — Progress Notes (Signed)
[facility]  ROUNDING NOTE   Subjective:   Patient admitted for concerns of bloodstream infection. Right IJ PermCath has been removed Blood cultures are negative from November 5 and November 3 Patient denies any acute shortness of breath  Objective:  Vital signs in last 24 hours:  Temp:  [97.9 F (36.6 C)-99.1 F (37.3 C)] 97.9 F (36.6 C) (11/06 0834) Pulse Rate:  [82-94] 88 (11/06 0834) Resp:  [18-20] 20 (11/06 0434) BP: (89-131)/(43-54) 110/44 (11/06 0834) SpO2:  [93 %-98 %] 98 % (11/06 0834)  Weight change:  Filed Weights   08/03/20 1554 08/04/20 0602  Weight: 84.4 kg 92.4 kg    Intake/Output: I/O last 3 completed shifts: In: 450 [P.O.:450] Out: 100 [Urine:100]   Intake/Output this shift:  No intake/output data recorded.  Physical Exam: General: In no acute distress  Head: Normocephalic,Atraumatic  Eyes: Anicteric  Lungs:  Normal and symmetrical respiratory effort,Lungs clear  Heart: Regular rate and rhythm  Abdomen:  Soft, nontender, non distended  Extremities:  2+peripheral edema, lower legs wrapped with dressing  Neurologic: Awake,alert,oriented  Skin: Bilateral lower legs with dressing  Access: Rt IJ Permcath discontinued on 18/84/1660    Basic Metabolic Panel: Recent Labs  Lab 08/03/20 1557 08/03/20 1557 08/04/20 0714 08/05/20 0817 08/06/20 0535 08/07/20 0656  NA 133*  --   --  134*  134* 132* 135  K 4.7  --   --  4.1  4.1 4.0 3.9  CL 96*  --   --  99  100 98 97*  CO2 26  --   --  24  23 25 27   GLUCOSE 272*  --   --  90  97 109* 98  BUN 37*  --   --  54*  57* 29* 40*  CREATININE 2.82*  --  3.33* 3.95*  3.89* 3.27* 4.43*  CALCIUM 6.7*   < >  --  6.3*  6.4* 6.7* 7.0*  PHOS  --   --   --  4.3  --   --    < > = values in this interval not displayed.    Liver Function Tests: Recent Labs  Lab 08/03/20 1557 08/05/20 0817  AST 12*  --   ALT 7  --   ALKPHOS 68  --   BILITOT 0.8  --   PROT 6.5  --   ALBUMIN 2.9* 2.2*    Recent Labs  Lab 08/03/20 1557  LIPASE 34   No results for input(s): AMMONIA in the last 168 hours.  CBC: Recent Labs  Lab 08/03/20 1557 08/03/20 1557 08/04/20 0714 08/05/20 0817 08/06/20 0535 08/06/20 1246 08/07/20 0656  WBC 8.8  --  8.5 6.9 4.5  --  4.7  NEUTROABS  --   --   --  4.8 3.0  --  2.8  HGB 8.6*   < > 9.0* 7.8* 6.9* 7.3* 7.4*  HCT 27.0*  --  28.4* 25.0* 22.4*  --  23.9*  MCV 97.1  --  98.6 97.7 97.8  --  96.4  PLT 163  --  132* 116* 92*  --  92*   < > = values in this interval not displayed.    Cardiac Enzymes: No results for input(s): CKTOTAL, CKMB, CKMBINDEX, TROPONINI in the last 168 hours.  BNP: Invalid input(s): POCBNP  CBG: Recent Labs  Lab 08/06/20 1603 08/06/20 1959 08/07/20 0138 08/07/20 0432 08/07/20 0752  GLUCAP 101* 127* 91 92 96    Microbiology: Results for orders placed  or performed during the hospital encounter of 08/04/20  Culture, blood (routine x 2)     Status: None (Preliminary result)   Collection Time: 08/04/20  3:09 AM   Specimen: BLOOD  Result Value Ref Range Status   Specimen Description BLOOD BLOOD LEFT ARM  Final   Special Requests   Final    BOTTLES DRAWN AEROBIC AND ANAEROBIC Blood Culture results may not be optimal due to an inadequate volume of blood received in culture bottles   Culture   Final    NO GROWTH 3 DAYS Performed at [facility], [address], [place], [place] [address]    Report Status PENDING  Incomplete  Culture, blood (routine x 2)     Status: None (Preliminary result)   Collection Time: 08/04/20  3:09 AM   Specimen: BLOOD  Result Value Ref Range Status   Specimen Description BLOOD LEFT ANTECUBITAL  Final   Special Requests   Final    BOTTLES DRAWN AEROBIC AND ANAEROBIC Blood Culture results may not be optimal due to an inadequate volume of blood received in culture bottles   Culture   Final    NO GROWTH 3 DAYS Performed at [facility], [address]  [place], [place] [address]    Report Status PENDING  Incomplete  Respiratory Panel by RT PCR (Flu A&B, Covid) - Nasopharyngeal Swab     Status: None   Collection Time: 08/04/20  3:09 AM   Specimen: Nasopharyngeal Swab  Result Value Ref Range Status   SARS Coronavirus 2 by RT PCR NEGATIVE NEGATIVE Final    Comment: (NOTE) SARS-CoV-2 target nucleic acids are NOT DETECTED.  The SARS-CoV-2 RNA is generally detectable in upper respiratoy specimens during the acute phase of infection. The lowest concentration of SARS-CoV-2 viral copies this assay can detect is 131 copies/mL. A negative result does not preclude SARS-Cov-2 infection and should not be used as the sole basis for treatment or other patient management decisions. A negative result may occur with  improper specimen collection/handling, submission of specimen other than nasopharyngeal swab, presence of viral mutation(s) within the areas targeted by this assay, and inadequate number of viral copies (<131 copies/mL). A negative result must be combined with clinical observations, patient history, and epidemiological information. The expected result is Negative.  Fact Sheet for Patients:  PinkCheek.be  Fact Sheet for Healthcare Providers:  GravelBags.it  This test is no t yet approved or cleared by the [place] FDA and  has been authorized for detection and/or diagnosis of SARS-CoV-2 by FDA under an Emergency Use Authorization (EUA). This EUA will remain  in effect (meaning this test can be used) for the duration of the COVID-19 declaration under Section 564(b)(1) of the Act, 21 U.S.C. section 360bbb-3(b)(1), unless the authorization is terminated or revoked sooner.     Influenza A by PCR NEGATIVE NEGATIVE Final   Influenza B by PCR NEGATIVE NEGATIVE Final    Comment: (NOTE) The Xpert Xpress SARS-CoV-2/FLU/RSV assay is intended as an aid in  the diagnosis of influenza  from Nasopharyngeal swab specimens and  should not be used as a sole basis for treatment. Nasal washings and  aspirates are unacceptable for Xpert Xpress SARS-CoV-2/FLU/RSV  testing.  Fact Sheet for Patients: PinkCheek.be  Fact Sheet for Healthcare Providers: GravelBags.it  This test is not yet approved or cleared by the [place] FDA and  has been authorized for detection and/or diagnosis of SARS-CoV-2 by  FDA under an Emergency Use Authorization (EUA). This  EUA will remain  in effect (meaning this test can be used) for the duration of the  Covid-19 declaration under Section 564(b)(1) of the Act, 21  U.S.C. section 360bbb-3(b)(1), unless the authorization is  terminated or revoked. Performed at [facility], [address] [place], [place] [address]   CULTURE, BLOOD (ROUTINE X 2) w Reflex to ID Panel     Status: None (Preliminary result)   Collection Time: 08/06/20 12:17 AM   Specimen: BLOOD  Result Value Ref Range Status   Specimen Description BLOOD BLOOD LEFT HAND  Final   Special Requests   Final    BOTTLES DRAWN AEROBIC AND ANAEROBIC Blood Culture results may not be optimal due to an inadequate volume of blood received in culture bottles   Culture   Final    NO GROWTH 1 DAY Performed at [facility], [address] [place], [place] [address]    Report Status PENDING  Incomplete  Culture, blood (Routine X 2) w Reflex to ID Panel     Status: None (Preliminary result)   Collection Time: 08/06/20  5:35 AM   Specimen: BLOOD  Result Value Ref Range Status   Specimen Description BLOOD RIGHT ANTECUBITAL  Final   Special Requests   Final    BOTTLES DRAWN AEROBIC AND ANAEROBIC Blood Culture adequate volume   Culture   Final    NO GROWTH 1 DAY Performed at [facility], [address], [place], [place] [address]    Report Status PENDING  Incomplete    Coagulation Studies: No results  for input(s): LABPROT, INR in the last 72 hours.  Urinalysis: No results for input(s): COLORURINE, LABSPEC, PHURINE, GLUCOSEU, HGBUR, BILIRUBINUR, KETONESUR, PROTEINUR, UROBILINOGEN, NITRITE, LEUKOCYTESUR in the last 72 hours.  Invalid input(s): APPERANCEUR    Imaging: PERIPHERAL VASCULAR CATHETERIZATION  Result Date: 08/05/2020 See op note  ECHOCARDIOGRAM COMPLETE  Result Date: 08/06/2020    ECHOCARDIOGRAM REPORT   Patient Name:   [name] Date of Exam: 08/05/2020 Medical Rec #:  [id number]         Height:       64.0 in Accession #:    [id number]        Weight:       203.7 lb Date of Birth:  1934/03/25         BSA:          1.972 m Patient Age:    53 years          BP:           113/63 mmHg Patient Gender: F                 HR:           97 bpm. Exam Location:  [facility] Procedure: 2D Echo, Color Doppler and Cardiac Doppler Indications:     R78.81 Stroke  History:         Patient has prior history of Echocardiogram examinations, most                  recent 06/23/2020. CAD, Prior CABG, Stroke and CKD; Risk                  Factors:Diabetes, Hypertension and Dyslipidemia.  Sonographer:     Charmayne Sheer RDCS (AE) Referring Phys:  [id number] Sidney Ace Diagnosing Phys: Ida Rogue MD  Sonographer Comments: No subcostal window. IMPRESSIONS  1. Left ventricular ejection fraction, by estimation, is 60 to 65%. The  left ventricle has normal function. The left ventricle has no regional wall motion abnormalities. Left ventricular diastolic parameters are consistent with Grade I diastolic dysfunction (impaired relaxation).  2. Right ventricular systolic function is normal. The right ventricular size is normal.  3. Left atrial size was mildly dilated. FINDINGS  Left Ventricle: Left ventricular ejection fraction, by estimation, is 60 to 65%. The left ventricle has normal function. The left ventricle has no regional wall motion abnormalities. The left ventricular internal cavity size was normal in size.  There is  no left ventricular hypertrophy. Left ventricular diastolic parameters are consistent with Grade I diastolic dysfunction (impaired relaxation). Right Ventricle: The right ventricular size is normal. No increase in right ventricular wall thickness. Right ventricular systolic function is normal. Left Atrium: Left atrial size was mildly dilated. Right Atrium: Right atrial size was normal in size. Pericardium: There is no evidence of pericardial effusion. Mitral Valve: The mitral valve is normal in structure. No evidence of mitral valve regurgitation. No evidence of mitral valve stenosis. MV peak gradient, 4.2 mmHg. The mean mitral valve gradient is 2.0 mmHg. Tricuspid Valve: The tricuspid valve is normal in structure. Tricuspid valve regurgitation is not demonstrated. No evidence of tricuspid stenosis. Aortic Valve: The aortic valve is normal in structure. Aortic valve regurgitation is not visualized. Mild to moderate aortic valve sclerosis/calcification is present, without any evidence of aortic stenosis. Aortic valve mean gradient measures 8.0 mmHg. Aortic valve peak gradient measures 13.7 mmHg. Aortic valve area, by VTI measures 1.63 cm. Pulmonic Valve: The pulmonic valve was normal in structure. Pulmonic valve regurgitation is not visualized. No evidence of pulmonic stenosis. Aorta: The aortic root is normal in size and structure. Venous: The inferior vena cava is normal in size with greater than 50% respiratory variability, suggesting right atrial pressure of 3 mmHg. IAS/Shunts: No atrial level shunt detected by color flow Doppler.  LEFT VENTRICLE PLAX 2D LVIDd:         4.45 cm  Diastology LVIDs:         2.56 cm  LV e' medial:    6.42 cm/s LV PW:         1.03 cm  LV E/e' medial:  11.5 LV IVS:        0.90 cm  LV e' lateral:   6.74 cm/s LVOT diam:     1.90 cm  LV E/e' lateral: 10.9 LV SV:         51 LV SV Index:   26 LVOT Area:     2.84 cm  LEFT ATRIUM             Index LA diam:        3.80 cm 1.93 cm/m  LA Vol (A2C):   49.0 ml 24.85 ml/m LA Vol (A4C):   70.9 ml 35.96 ml/m LA Biplane Vol: 59.3 ml 30.08 ml/m  AORTIC VALVE                    PULMONIC VALVE AV Area (Vmax):    1.53 cm     PV Vmax:       1.25 m/s AV Area (Vmean):   1.53 cm     PV Vmean:      81.700 cm/s AV Area (VTI):     1.63 cm     PV VTI:        0.212 m AV Vmax:           185.00 cm/s  PV Peak grad:  6.2  mmHg AV Vmean:          138.000 cm/s PV Mean grad:  3.0 mmHg AV VTI:            0.314 m AV Peak Grad:      13.7 mmHg AV Mean Grad:      8.0 mmHg LVOT Vmax:         99.80 cm/s LVOT Vmean:        74.500 cm/s LVOT VTI:          0.181 m LVOT/AV VTI ratio: 0.58  AORTA Ao Root diam: 2.90 cm MITRAL VALVE MV Area (PHT): 5.66 cm     SHUNTS MV Peak grad:  4.2 mmHg     Systemic VTI:  0.18 m MV Mean grad:  2.0 mmHg     Systemic Diam: 1.90 cm MV Vmax:       1.02 m/s MV Vmean:      70.7 cm/s MV Decel Time: 134 msec MV E velocity: 73.70 cm/s MV A velocity: 104.00 cm/s MV E/A ratio:  0.71 Ida Rogue MD Electronically signed by Ida Rogue MD Signature Date/Time: 08/06/2020/7:24:42 AM    Final      Medications:   . piperacillin-tazobactam (ZOSYN)  IV 2.25 g (08/07/20 0513)   . sodium chloride   Intravenous Once  . amLODipine  5 mg Oral Daily  . calcium carbonate  2 tablet Oral BID  . Chlorhexidine Gluconate Cloth  6 each Topical Q0600  . clopidogrel  75 mg Oral Daily  . furosemide  20 mg Intravenous Once  . gabapentin  200 mg Oral BID  . insulin aspart  0-5 Units Subcutaneous QHS  . insulin aspart  0-9 Units Subcutaneous TID WC  . levothyroxine  150 mcg Oral Q0600  . metoprolol tartrate  50 mg Oral BID  . pneumococcal 23 valent vaccine  0.5 mL Intramuscular Tomorrow-1000  . torsemide  100 mg Oral Daily  . vancomycin variable dose per unstable renal function (pharmacist dosing)   Does not apply See admin instructions   diphenoxylate-atropine, loperamide, ondansetron (ZOFRAN) IV, oxyCODONE  Assessment/ Plan:  [name]  is a 86 y.o.  female  well known to our practice. Patient was sent in to ED from dialysis clinic for bacteremia. Cultures growing Staph Warneri  [facility] TTS [place] IJ permcath 91.5kg  #ESRD on dialysis TTS  Patient received dialysis treatment Thursday Volume and electrolyte status acceptable Rt.IJ Permcath discontinued this admission due to bacteremia, allowing a catheter free period Plan for hemodialysis on Tuesday after new PermCath is available  #Bacteremia/sepsis Currently getting treatment with Zosyn and vancomycin  #Secondary Hyperparathyroidism with hypocalemia.  Lab Results  Component Value Date   PTH 163 (H) 06/25/2020   CALCIUM 7.0 (L) 08/07/2020   CAION 1.18 09/06/2012   PHOS 4.3 08/05/2020    Continue Calcium Carbonate Will monitor bone mineral metabolism parameters  # Anemia of CKD Lab Results  Component Value Date   HGB 7.4 (L) 08/07/2020   Monitor EPO with HD    LOS: 3 Doreene Forrey 11/6/20219:07 AM

## 2020-08-07 NOTE — Progress Notes (Signed)
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  Results of outpatient blood culture were sent to the inpatient nephrology consultant Dr. Juleen China.  We will attempt to have these uploaded into care everywhere.  Preliminary results from blood cultures growing Staphylococcus warneri.  Concerning resistance pattern but is sensitive to vancomycin which she is on  Regarding her foot wound wound and ostomy has evaluated in place dressing.  I have taken a picture and included in the medical record.  I have requested consultation from Dr. Vickki Muff of podiatry.  Will defer any further imaging after surgical evaluation.  11/4: Consultations obtained with infectious disease and podiatry.  Per podiatry no acute need for surgical management.  Recommending outpatient biopsy to rule out pyoderma gangrenosum and consideration for outpatient wound VAC.  Per ID source may be related to indwelling dialysis catheter.  Recommending dialysis catheter removal.  Communicated with nephrology, plan to remove dialysis catheter after HD today.  No fevers noted over interval.  All cultures on admission remain negative.   11/5: Indwelling catheter removed 08/05/2020.  Patient had some anemia and thrombocytopenia noted post catheter removal.  No bleeding noted.  Catheter site looks clean.  All cultures remain negative to date.  11/6: Hemoglobin stable at 7.4.  No indication for transfusion at this time.  No bleeding noted.   Assessment & Plan:   Principal Problem:   Wound infection Active Problems:   Hyperlipidemia   Hypertension   GERD (gastroesophageal reflux disease)   S/P  CABG x 4   CAD (coronary artery disease)   Type 2 diabetes with nephropathy (HCC)   ESRD on hemodialysis ([place])   History of CVA with residual deficit   Ambulatory dysfunction   Anemia of chronic kidney failure, stage 5 (HCC)   Pressure injury of skin  Dorsal foot wound -Wound culture from 10/15 grew Pseudomonas aeruginosa, Enterococcus faecalis and staph aureus -Etiology of wound is unclear -Infectious disease on consult -Podiatry on consult, no acute surgical management Plan: Continue vancomycin and Zosyn for now Continue wound care Extremity elevation Pain control as needed  Staph warneri bacteremia Noted on outpatient blood culture Resistant to oxacillin Currently on vancomycin No fevers over interval Surveillance cultures not demonstrating growth Indwelling catheter removed 08/05/2020 2D echocardiogram with no evidence of vegetation Plan: Continue vancomycin for now Follow surveillance cultures Appreciate ID follow-up Plan to give patient a line holiday over the weekend and replace PermCath on Monday, 08/09/2020    Hypertension -Stable -Continue amlodipine, metoprolol    GERD (gastroesophageal reflux disease) -Chronic and stable.  Continue famotidine    S/P CABG x 4   CAD (coronary artery disease) -Chronic and stable with no complaints of chest pain -Continue aspirin, Plavix, metoprolol.  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  Not currently on statin per med list -Patient ambulate with a cane    Anemia of chronic kidney failure, stage 5 (HCC) Thrombocytopenia Worsened  after vascular procedure Initial hemoglobin 6.9, repeat hemoglobin 7.3 No bleeding noted Defer transfusion at this  time Monitor for any bleeding Check daily CBC Hold chemoprophylaxis     DVT prophylaxis: Subcutaneous heparin Code Status: Full code Family Communication: Husband at bedside Disposition Plan: Status is: Inpatient  Remains inpatient appropriate because:IV treatments appropriate due to intensity of illness or inability to take PO and Inpatient level of care appropriate due to severity of illness   Dispo: The patient is from: Home              Anticipated d/c is to: Home              Anticipated d/c date is: 3 days              Patient currently is not medically stable to d/c.   Staphylococcal bacteremia.  Suspected source hemodialysis catheter.  Catheter has been removed.  Plan to monitor patient in house over the weekend with plans to replace line on 08/09/2020  Consultants:   Nephrology  Infectious disease  Podiatry  Vascular surgery  Procedures:   None  Antimicrobials:   Vancomycin  Zosyn   Subjective: Seen and examined.  Husband at bedside.  No pain complaints.  Expresses frustration about continued hospital stay.  Objective: Vitals:   08/06/20 2350 08/07/20 0434 08/07/20 0834 08/07/20 1137  BP: (!) 112/46 (!) 123/52 (!) 110/44 (!) 125/56  Pulse: 82 82 88 95  Resp: 18 20  18   Temp: 98.1 F (36.7 C) 98.2 F (36.8 C) 97.9 F (36.6 C) 98 F (36.7 C)  TempSrc:  Oral Oral Oral  SpO2: 93% 93% 98% 93%  Weight:      Height:        Intake/Output Summary (Last 24 hours) at 08/07/2020 1306 Last data filed at 08/07/2020 0514 Gross per 24 hour  Intake 450 ml  Output 0 ml  Net 450 ml   Filed Weights   08/03/20 1554 08/04/20 0602  Weight: 84.4 kg 92.4 kg    Examination:  General exam: Appears calm and comfortable  Respiratory system: Clear to auscultation. Respiratory effort normal. Cardiovascular system: S1 & S2 heard, RRR. No JVD, murmurs, rubs, gallops or clicks. No pedal edema. Gastrointestinal system: Hyperactive bowel sounds, soft, nondistended,  no masses Central nervous system: Alert and oriented. No focal neurological deficits. Extremities: Symmetric 5 x 5 power. Skin:  5 x 7 cm ulcerative wound to dorsal aspect of right midfoot Psychiatry: Judgement and insight appear normal. Mood & affect appropriate.     Data Reviewed: I have personally reviewed following labs and imaging studies  CBC: Recent Labs  Lab 08/03/20 1557 08/03/20 1557 08/04/20 0714 08/05/20 0817 08/06/20 0535 08/06/20 1246 08/07/20 0656  WBC 8.8  --  8.5 6.9 4.5  --  4.7  NEUTROABS  --   --   --  4.8 3.0  --  2.8  HGB 8.6*   < > 9.0* 7.8* 6.9* 7.3* 7.4*  HCT 27.0*  --  28.4* 25.0* 22.4*  --  23.9*  MCV 97.1  --  98.6 97.7 97.8  --  96.4  PLT 163  --  132* 116* 92*  --  92*   < > = values in this interval not displayed.   Basic Metabolic Panel: Recent Labs  Lab 08/03/20 1557 08/04/20 0714 08/05/20 0817 08/06/20 0535 08/07/20 0656  NA 133*  --  134*  134* 132* 135  K 4.7  --  4.1  4.1 4.0  3.9  CL 96*  --  99  100 98 97*  CO2 26  --  24  23 25 27   GLUCOSE 272*  --  90  97 109* 98  BUN 37*  --  54*  57* 29* 40*  CREATININE 2.82* 3.33* 3.95*  3.89* 3.27* 4.43*  CALCIUM 6.7*  --  6.3*  6.4* 6.7* 7.0*  PHOS  --   --  4.3  --   --    GFR: Estimated Creatinine Clearance: 10.6 mL/min (A) (by C-G formula based on SCr of 4.43 mg/dL (H)). Liver Function Tests: Recent Labs  Lab 08/03/20 1557 08/05/20 0817  AST 12*  --   ALT 7  --   ALKPHOS 68  --   BILITOT 0.8  --   PROT 6.5  --   ALBUMIN 2.9* 2.2*   Recent Labs  Lab 08/03/20 1557  LIPASE 34   No results for input(s): AMMONIA in the last 168 hours. Coagulation Profile: No results for input(s): INR, PROTIME in the last 168 hours. Cardiac Enzymes: No results for input(s): CKTOTAL, CKMB, CKMBINDEX, TROPONINI in the last 168 hours. BNP (last 3 results) No results for input(s): PROBNP in the last 8760 hours. HbA1C: No results for input(s): HGBA1C in the last 72  hours. CBG: Recent Labs  Lab 08/06/20 1959 08/07/20 0138 08/07/20 0432 08/07/20 0752 08/07/20 1138  GLUCAP 127* 91 92 96 118*   Lipid Profile: No results for input(s): CHOL, HDL, LDLCALC, TRIG, CHOLHDL, LDLDIRECT in the last 72 hours. Thyroid Function Tests: No results for input(s): TSH, T4TOTAL, FREET4, T3FREE, THYROIDAB in the last 72 hours. Anemia Panel: No results for input(s): VITAMINB12, FOLATE, FERRITIN, TIBC, IRON, RETICCTPCT in the last 72 hours. Sepsis Labs: Recent Labs  Lab 08/04/20 0309  PROCALCITON 0.41  LATICACIDVEN 1.2    Recent Results (from the past 240 hour(s))  Culture, blood (routine x 2)     Status: None (Preliminary result)   Collection Time: 08/04/20  3:09 AM   Specimen: BLOOD  Result Value Ref Range Status   Specimen Description BLOOD BLOOD LEFT ARM  Final   Special Requests   Final    BOTTLES DRAWN AEROBIC AND ANAEROBIC Blood Culture results may not be optimal due to an inadequate volume of blood received in culture bottles   Culture   Final    NO GROWTH 3 DAYS Performed at [facility], [address] [place], [place] [address]    Report Status PENDING  Incomplete  Culture, blood (routine x 2)     Status: None (Preliminary result)   Collection Time: 08/04/20  3:09 AM   Specimen: BLOOD  Result Value Ref Range Status   Specimen Description BLOOD LEFT ANTECUBITAL  Final   Special Requests   Final    BOTTLES DRAWN AEROBIC AND ANAEROBIC Blood Culture results may not be optimal due to an inadequate volume of blood received in culture bottles   Culture   Final    NO GROWTH 3 DAYS Performed at [facility], [address], [place], [place] [address]    Report Status PENDING  Incomplete  Respiratory Panel by RT PCR (Flu A&B, Covid) - Nasopharyngeal Swab     Status: None   Collection Time: 08/04/20  3:09 AM   Specimen: Nasopharyngeal Swab  Result Value Ref Range Status   SARS Coronavirus 2 by RT PCR NEGATIVE NEGATIVE Final     Comment: (NOTE) SARS-CoV-2 target nucleic acids are NOT DETECTED.  The SARS-CoV-2 RNA  is generally detectable in upper respiratoy specimens during the acute phase of infection. The lowest concentration of SARS-CoV-2 viral copies this assay can detect is 131 copies/mL. A negative result does not preclude SARS-Cov-2 infection and should not be used as the sole basis for treatment or other patient management decisions. A negative result may occur with  improper specimen collection/handling, submission of specimen other than nasopharyngeal swab, presence of viral mutation(s) within the areas targeted by this assay, and inadequate number of viral copies (<131 copies/mL). A negative result must be combined with clinical observations, patient history, and epidemiological information. The expected result is Negative.  Fact Sheet for Patients:  PinkCheek.be  Fact Sheet for Healthcare Providers:  GravelBags.it  This test is no t yet approved or cleared by the [place] FDA and  has been authorized for detection and/or diagnosis of SARS-CoV-2 by FDA under an Emergency Use Authorization (EUA). This EUA will remain  in effect (meaning this test can be used) for the duration of the COVID-19 declaration under Section 564(b)(1) of the Act, 21 U.S.C. section 360bbb-3(b)(1), unless the authorization is terminated or revoked sooner.     Influenza A by PCR NEGATIVE NEGATIVE Final   Influenza B by PCR NEGATIVE NEGATIVE Final    Comment: (NOTE) The Xpert Xpress SARS-CoV-2/FLU/RSV assay is intended as an aid in  the diagnosis of influenza from Nasopharyngeal swab specimens and  should not be used as a sole basis for treatment. Nasal washings and  aspirates are unacceptable for Xpert Xpress SARS-CoV-2/FLU/RSV  testing.  Fact Sheet for Patients: PinkCheek.be  Fact Sheet for Healthcare  Providers: GravelBags.it  This test is not yet approved or cleared by the [place] FDA and  has been authorized for detection and/or diagnosis of SARS-CoV-2 by  FDA under an Emergency Use Authorization (EUA). This EUA will remain  in effect (meaning this test can be used) for the duration of the  Covid-19 declaration under Section 564(b)(1) of the Act, 21  U.S.C. section 360bbb-3(b)(1), unless the authorization is  terminated or revoked. Performed at [facility], [address] [place], [place] [address]   CULTURE, BLOOD (ROUTINE X 2) w Reflex to ID Panel     Status: None (Preliminary result)   Collection Time: 08/06/20 12:17 AM   Specimen: BLOOD  Result Value Ref Range Status   Specimen Description BLOOD BLOOD LEFT HAND  Final   Special Requests   Final    BOTTLES DRAWN AEROBIC AND ANAEROBIC Blood Culture results may not be optimal due to an inadequate volume of blood received in culture bottles   Culture   Final    NO GROWTH 1 DAY Performed at [facility], [address], [place], [place] [address]    Report Status PENDING  Incomplete  Culture, blood (Routine X 2) w Reflex to ID Panel     Status: None (Preliminary result)   Collection Time: 08/06/20  5:35 AM   Specimen: BLOOD  Result Value Ref Range Status   Specimen Description BLOOD RIGHT ANTECUBITAL  Final   Special Requests   Final    BOTTLES DRAWN AEROBIC AND ANAEROBIC Blood Culture adequate volume   Culture   Final    NO GROWTH 1 DAY Performed at [facility], [address], [place], [place] [address]    Report Status PENDING  Incomplete         Radiology Studies: PERIPHERAL VASCULAR CATHETERIZATION  Result Date: 08/05/2020 See op note  Scheduled Meds: . sodium chloride   Intravenous Once  . amLODipine  5 mg Oral Daily  . calcium carbonate  2 tablet Oral BID  . Chlorhexidine Gluconate Cloth  6 each Topical Q0600  . clopidogrel  75  mg Oral Daily  . furosemide  20 mg Intravenous Once  . gabapentin  200 mg Oral BID  . insulin aspart  0-5 Units Subcutaneous QHS  . insulin aspart  0-9 Units Subcutaneous TID WC  . levothyroxine  150 mcg Oral Q0600  . metoprolol tartrate  50 mg Oral BID  . pneumococcal 23 valent vaccine  0.5 mL Intramuscular Tomorrow-1000  . torsemide  100 mg Oral Daily  . vancomycin variable dose per unstable renal function (pharmacist dosing)   Does not apply See admin instructions   Continuous Infusions: . piperacillin-tazobactam (ZOSYN)  IV 2.25 g (08/07/20 0513)     LOS: 3 days    Time spent: 15 minutes    Sidney Ace, MD [facility] Pager [phone]  If 7PM-7AM, please contact night-coverage 08/07/2020, 1:06 PM

## 2020-08-07 NOTE — H&P (View-Only) (Signed)
2 Days Post-Op   Subjective/Chief Complaint: Permacath removed secondary to bacteremia on 11/4   Objective: Vital signs in last 24 hours: Temp:  [97.9 F (36.6 C)-99.1 F (37.3 C)] 98 F (36.7 C) (11/06 1137) Pulse Rate:  [82-95] 95 (11/06 1137) Resp:  [18-20] 18 (11/06 1137) BP: (110-131)/(44-56) 125/56 (11/06 1137) SpO2:  [93 %-98 %] 93 % (11/06 1137) Last BM Date: 08/04/20 (last documented, pt. can not recall)  Intake/Output from previous day: 11/05 0701 - 11/06 0700 In: 450 [P.O.:450] Out: 0  Intake/Output this shift: No intake/output data recorded.  General appearance: no distress Resp: clear to auscultation bilaterally Cardio: regular rate and rhythm  Lab Results:  Recent Labs    08/06/20 0535 08/06/20 0535 08/06/20 1246 08/07/20 0656  WBC 4.5  --   --  4.7  HGB 6.9*   < > 7.3* 7.4*  HCT 22.4*  --   --  23.9*  PLT 92*  --   --  92*   < > = values in this interval not displayed.   BMET Recent Labs    08/06/20 0535 08/07/20 0656  NA 132* 135  K 4.0 3.9  CL 98 97*  CO2 25 27  GLUCOSE 109* 98  BUN 29* 40*  CREATININE 3.27* 4.43*  CALCIUM 6.7* 7.0*   PT/INR No results for input(s): LABPROT, INR in the last 72 hours. ABG No results for input(s): PHART, HCO3 in the last 72 hours.  Invalid input(s): PCO2, PO2  Studies/Results: PERIPHERAL VASCULAR CATHETERIZATION  Result Date: 08/05/2020 See op note   Anti-infectives: Anti-infectives (From admission, onward)   Start     Dose/Rate Route Frequency Ordered Stop   08/06/20 1435  vancomycin variable dose per unstable renal function (pharmacist dosing)         Does not apply See admin instructions 08/06/20 1436     08/05/20 1800  cefTAZidime (FORTAZ) 1 g in sodium chloride 0.9 % 100 mL IVPB  Status:  Discontinued       Note to Pharmacy: Give after dialysis sessions on HD days.    1 g 200 mL/hr over 30 Minutes Intravenous Every 24 hours 08/04/20 0607 08/04/20 1052   08/05/20 1200  vancomycin  (VANCOCIN) IVPB 1000 mg/200 mL premix  Status:  Discontinued        1,000 mg 200 mL/hr over 60 Minutes Intravenous Every T-Th-Sa (Hemodialysis) 08/04/20 0612 08/06/20 1436   08/04/20 1400  piperacillin-tazobactam (ZOSYN) IVPB 2.25 g        2.25 g 100 mL/hr over 30 Minutes Intravenous Every 8 hours 08/04/20 1214     08/04/20 0330  vancomycin (VANCOREADY) IVPB 1500 mg/300 mL        1,500 mg 150 mL/hr over 120 Minutes Intravenous  Once 08/04/20 0326 08/04/20 0622   08/04/20 0315  cefTAZidime (FORTAZ) 1 g in sodium chloride 0.9 % 100 mL IVPB        1 g 200 mL/hr over 30 Minutes Intravenous  Once 08/04/20 0300 08/04/20 0438   08/04/20 0315  vancomycin (VANCOCIN) IVPB 1000 mg/200 mL premix  Status:  Discontinued        1,000 mg 200 mL/hr over 60 Minutes Intravenous  Once 08/04/20 0300 08/04/20 0326      Assessment/Plan: s/p Procedure(s): DIALYSIS/PERMA CATHETER REMOVAL (N/A) Will plan for new Permacath placement on Tuesday  Will monitor cultures.  LOS: 3 days    Jamesetta So A 08/07/2020

## 2020-08-07 NOTE — Progress Notes (Signed)
2 Days Post-Op   Subjective/Chief Complaint: Permacath removed secondary to bacteremia on 11/4   Objective: Vital signs in last 24 hours: Temp:  [97.9 F (36.6 C)-99.1 F (37.3 C)] 98 F (36.7 C) (11/06 1137) Pulse Rate:  [82-95] 95 (11/06 1137) Resp:  [18-20] 18 (11/06 1137) BP: (110-131)/(44-56) 125/56 (11/06 1137) SpO2:  [93 %-98 %] 93 % (11/06 1137) Last BM Date: 08/04/20 (last documented, pt. can not recall)  Intake/Output from previous day: 11/05 0701 - 11/06 0700 In: 450 [P.O.:450] Out: 0  Intake/Output this shift: No intake/output data recorded.  General appearance: no distress Resp: clear to auscultation bilaterally Cardio: regular rate and rhythm  Lab Results:  Recent Labs    08/06/20 0535 08/06/20 0535 08/06/20 1246 08/07/20 0656  WBC 4.5  --   --  4.7  HGB 6.9*   < > 7.3* 7.4*  HCT 22.4*  --   --  23.9*  PLT 92*  --   --  92*   < > = values in this interval not displayed.   BMET Recent Labs    08/06/20 0535 08/07/20 0656  NA 132* 135  K 4.0 3.9  CL 98 97*  CO2 25 27  GLUCOSE 109* 98  BUN 29* 40*  CREATININE 3.27* 4.43*  CALCIUM 6.7* 7.0*   PT/INR No results for input(s): LABPROT, INR in the last 72 hours. ABG No results for input(s): PHART, HCO3 in the last 72 hours.  Invalid input(s): PCO2, PO2  Studies/Results: PERIPHERAL VASCULAR CATHETERIZATION  Result Date: 08/05/2020 See op note   Anti-infectives: Anti-infectives (From admission, onward)   Start     Dose/Rate Route Frequency Ordered Stop   08/06/20 1435  vancomycin variable dose per unstable renal function (pharmacist dosing)         Does not apply See admin instructions 08/06/20 1436     08/05/20 1800  cefTAZidime (FORTAZ) 1 g in sodium chloride 0.9 % 100 mL IVPB  Status:  Discontinued       Note to Pharmacy: Give after dialysis sessions on HD days.    1 g 200 mL/hr over 30 Minutes Intravenous Every 24 hours 08/04/20 0607 08/04/20 1052   08/05/20 1200  vancomycin  (VANCOCIN) IVPB 1000 mg/200 mL premix  Status:  Discontinued        1,000 mg 200 mL/hr over 60 Minutes Intravenous Every T-Th-Sa (Hemodialysis) 08/04/20 0612 08/06/20 1436   08/04/20 1400  piperacillin-tazobactam (ZOSYN) IVPB 2.25 g        2.25 g 100 mL/hr over 30 Minutes Intravenous Every 8 hours 08/04/20 1214     08/04/20 0330  vancomycin (VANCOREADY) IVPB 1500 mg/300 mL        1,500 mg 150 mL/hr over 120 Minutes Intravenous  Once 08/04/20 0326 08/04/20 0622   08/04/20 0315  cefTAZidime (FORTAZ) 1 g in sodium chloride 0.9 % 100 mL IVPB        1 g 200 mL/hr over 30 Minutes Intravenous  Once 08/04/20 0300 08/04/20 0438   08/04/20 0315  vancomycin (VANCOCIN) IVPB 1000 mg/200 mL premix  Status:  Discontinued        1,000 mg 200 mL/hr over 60 Minutes Intravenous  Once 08/04/20 0300 08/04/20 0326      Assessment/Plan: s/p Procedure(s): DIALYSIS/PERMA CATHETER REMOVAL (N/A) Will plan for new Permacath placement on Tuesday  Will monitor cultures.  LOS: 3 days    Jamesetta So A 08/07/2020

## 2020-08-07 NOTE — Progress Notes (Signed)
Date of Admission:  08/04/2020      ID: [name] is a 86 y.o. female  Principal Problem:   Wound infection Active Problems:   Hyperlipidemia   Hypertension   GERD (gastroesophageal reflux disease)   S/P CABG x 4   CAD (coronary artery disease)   Type 2 diabetes with nephropathy (HCC)   ESRD on hemodialysis (HCC)   History of CVA with residual deficit   Ambulatory dysfunction   Anemia of chronic kidney failure, stage 5 (HCC)   Pressure injury of skin    Subjective: Pt says she is feeling better  Medications:   sodium chloride   Intravenous Once   amLODipine  5 mg Oral Daily   calcium carbonate  2 tablet Oral BID   Chlorhexidine Gluconate Cloth  6 each Topical Q0600   clopidogrel  75 mg Oral Daily   [START ON 08/10/2020] epoetin (EPOGEN/PROCRIT) injection  4,000 Units Intravenous Q T,Th,Sa-HD   [START ON 08/08/2020] feeding supplement (NEPRO CARB STEADY)  237 mL Oral BID BM   furosemide  20 mg Intravenous Once   gabapentin  200 mg Oral BID   insulin aspart  0-5 Units Subcutaneous QHS   insulin aspart  0-9 Units Subcutaneous TID WC   levothyroxine  150 mcg Oral Q0600   metoprolol tartrate  50 mg Oral BID   pneumococcal 23 valent vaccine  0.5 mL Intramuscular Tomorrow-1000   torsemide  100 mg Oral Daily   vancomycin variable dose per unstable renal function (pharmacist dosing)   Does not apply See admin instructions    Objective: Vital signs in last 24 hours: Temp:  [97.9 F (36.6 C)-99.1 F (37.3 C)] 98 F (36.7 C) (11/06 1542) Pulse Rate:  [82-96] 96 (11/06 1542) Resp:  [18-20] 18 (11/06 1542) BP: (104-131)/(44-66) 104/66 (11/06 1542) SpO2:  [93 %-98 %] 94 % (11/06 1542)  PHYSICAL EXAM:  General: Alert, cooperative, no distress, appears stated age.  Lungs:b/l air entry Heart: Regular rate and rhythm, no murmur, rub or gallop. Abdomen: Soft, non-tender,not distended. Bowel sounds normal. No masses Extremities:rt foot dorsum-  ulcer   Edema decreasing Skin: No rashes or lesions. Or bruising Lymph: Cervical, supraclavicular normal. Neurologic: Grossly non-focal  Lab Results Recent Labs    08/06/20 0535 08/06/20 0535 08/06/20 1246 08/07/20 0656  WBC 4.5  --   --  4.7  HGB 6.9*   < > 7.3* 7.4*  HCT 22.4*  --   --  23.9*  NA 132*  --   --  135  K 4.0  --   --  3.9  CL 98  --   --  97*  CO2 25  --   --  27  BUN 29*  --   --  40*  CREATININE 3.27*  --   --  4.43*   < > = values in this interval not displayed.   Liver Panel Recent Labs    08/05/20 0817  ALBUMIN 2.2*    Assessment/Plan: Staph warneri bacteremia in a patient with ESRD -dialysis catheter removed , repeat cultures neg 2 d echo normal valves- no need for TEE Repeat blood culture 11/3 and 11/5 neg -continue vanco during dialysis for 4 weeks total awaiting new permacath next week  Rt foot wound- on dorsum of the foot- unclear etiology- culture done in wound clinic on 10/15 is pseudomonas, enterococcus, MSSA On  Zosyn Would change to ceftazidime on discharge so as to give it during dialysis for a total of  2-4 weeks ( depending on healing) Seen by podiatrist Agree with skin /tissue biopsy as OP to r/o pyoderma gangrenosum ? ?CAD s/p CABG ? _Anemia due to ESRD  Discussed the management with the patient

## 2020-08-07 NOTE — Evaluation (Signed)
Physical Therapy Evaluation Patient Details Name: [name] MRN: [id number] DOB: 03-Aug-1934 Today's Date: 08/07/2020   History of Present Illness  Arionna "Fraser Din" Marando is an 86 y/o female who was admitted with infected traumatic wound dorsal aspect of R foot followed by wound care clinic. She was sent to the ED for c/o bloodstream infection. Dialysis catheter removed 11/4. 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  Clinical Impression  Pt lying in bed upon arrival to room with husband at bedside. Pt agreeable to participate in PT evaluation. Pt with equivalent strength bilaterally in BUE and noted to be sligthly weaker in RLE than LLE. Pt performed rolling with use of bedrail and required assistance for pericare after having a BM. Pt required mod-max A for RLE management and for trunk elevation into sitting. CGA for sit <> stand for safety/steadying and stood to RW. Pt then ambulated 20 feet within room (pt deferred ambulating in hallway) with CGA. Pt left sitting up in recliner chair with BLE elevated on 2 pillows with heels floating. Pt with deficits in strength, balance, functional activity tolerance, pain, and functional mobility. Pt would benefit from skilled PT to address these deficits during acute stay. Recommend HHPT at discharge to maximize safety and independence with functional mobility and decreased falls risk.     Follow Up Recommendations Home health PT;Supervision for mobility/OOB    Equipment Recommendations  None recommended by PT;Other (comment) (pt has equipment already)    Recommendations for Other Services       Precautions / Restrictions Precautions Precautions: Fall Restrictions Weight Bearing Restrictions: No      Mobility  Bed Mobility Overal bed mobility: Needs Assistance Bed Mobility: Supine to  Sit;Rolling Rolling: Supervision   Supine to sit: Mod assist;Max assist     General bed mobility comments: no physical assist to roll; mod A for RLE management and max A for truncal elevation into sitting from flat bed    Transfers Overall transfer level: Needs assistance Equipment used: Rolling walker (2 wheeled) Transfers: Sit to/from Stand Sit to Stand: Min guard         General transfer comment: CGA for sit <> stand transfer for safety and steadying  Ambulation/Gait Ambulation/Gait assistance: Min guard Gait Distance (Feet): 20 Feet Assistive device: Rolling walker (2 wheeled) Gait Pattern/deviations: Step-through pattern;Decreased step length - right;Decreased step length - left;Trunk flexed Gait velocity: decreased   General Gait Details: Pt ambulated 20 feet using RW with CGA for steadying and safety; noted flexed trunk and reciprocal but decreased step lengths bilaterally  Stairs            Wheelchair Mobility    Modified Rankin (Stroke Patients Only)       Balance Overall balance assessment: Needs assistance Sitting-balance support: Feet supported;Bilateral upper extremity supported Sitting balance-Leahy Scale: Good Sitting balance - Comments: good balance with BUE assist on bed; supv for safety   Standing balance support: Bilateral upper extremity supported Standing balance-Leahy Scale: Fair Standing balance comment: CGA for sagety; reliant on RW for balance                             Pertinent Vitals/Pain Pain Assessment: Faces Faces Pain Scale: Hurts even more Pain Location: bilateral feet Pain Descriptors / Indicators: Burning Pain Intervention(s): Monitored  during session;Repositioned    Home Living Family/patient expects to be discharged to:: Private residence Living Arrangements: Spouse/significant other Available Help at Discharge: Family;Available 24 hours/day;Other (Comment) (husband) Type of Home: House Home Access:  Stairs to enter Entrance Stairs-Rails: Chemical engineer of Steps: 2 Home Layout: One level Home Equipment: Walker - 2 wheels;Bedside commode;Shower seat Additional Comments: pt reports utilizing BSC for bathroom positioned in the house    Prior Function Level of Independence: Needs assistance   Gait / Transfers Assistance Needed: Pt reports using RW at baseline and states that she requires min A for transfers.     Comments: Pt reports falling twice in the last 6 months due to LOB and (-) driving due to macular degeneration.     Hand Dominance        Extremity/Trunk Assessment   Upper Extremity Assessment Upper Extremity Assessment: Generalized weakness (bilat sh flex: 3=/5, bilat elbow flex/ext: 4/5; equal grip)    Lower Extremity Assessment Lower Extremity Assessment: Generalized weakness (grossly RLE: 3- to 3+/5; grossly LLE: 3+/5)       Communication   Communication: No difficulties  Cognition Arousal/Alertness: Awake/alert Behavior During Therapy: WFL for tasks assessed/performed;Flat affect Overall Cognitive Status: Within Functional Limits for tasks assessed                                 General Comments: A&O x 4      General Comments General comments (skin integrity, edema, etc.): BLE wrapped in coban from just below the knees to feet    Exercises Other Exercises Other Exercises: in sitting: marching, LAQ, and hip ab/add x 10 bilat; min A for bilat hip ab/add due to weakness Other Exercises: additional time taken for pericare/cleaning pt after having a BM   Assessment/Plan    PT Assessment Patient needs continued PT services  PT Problem List Decreased strength;Decreased activity tolerance;Decreased balance;Decreased mobility;Pain       PT Treatment Interventions DME instruction;Gait training;Stair training;Functional mobility training;Therapeutic activities;Therapeutic exercise;Balance training;Patient/family education     PT Goals (Current goals can be found in the Care Plan section)  Acute Rehab PT Goals Patient Stated Goal: to go home soon PT Goal Formulation: With patient Time For Goal Achievement: 08/21/20 Potential to Achieve Goals: Good    Frequency Min 2X/week   Barriers to discharge        Co-evaluation               AM-PAC PT "6 Clicks" Mobility  Outcome Measure Help needed turning from your back to your side while in a flat bed without using bedrails?: None Help needed moving from lying on your back to sitting on the side of a flat bed without using bedrails?: A Lot Help needed moving to and from a bed to a chair (including a wheelchair)?: A Little Help needed standing up from a chair using your arms (e.g., wheelchair or bedside chair)?: A Little Help needed to walk in hospital room?: A Little Help needed climbing 3-5 steps with a railing? : A Lot 6 Click Score: 17    End of Session Equipment Utilized During Treatment: Gait belt Activity Tolerance: Patient limited by fatigue;Patient limited by pain Patient left: in chair;with call bell/phone within reach;with chair alarm set;with family/visitor present Nurse Communication: Mobility status PT Visit Diagnosis: Unsteadiness on feet (R26.81);Other abnormalities of gait and mobility (R26.89);Muscle weakness (generalized) (M62.81);History of falling (Z91.81);Pain Pain - Right/Left:  (bilaterally) Pain -  part of body:  (feet)    Time: 6712-4580 PT Time Calculation (min) (ACUTE ONLY): 39 min   Charges:              [name], SPT  [name] 08/07/2020, 12:38 PM

## 2020-08-08 DIAGNOSIS — T148XXA Other injury of unspecified body region, initial encounter: Secondary | ICD-10-CM | POA: Diagnosis not present

## 2020-08-08 DIAGNOSIS — L089 Local infection of the skin and subcutaneous tissue, unspecified: Secondary | ICD-10-CM | POA: Diagnosis not present

## 2020-08-08 LAB — BASIC METABOLIC PANEL
Anion gap: 8 (ref 5–15)
BUN: 46 mg/dL — ABNORMAL HIGH (ref 8–23)
CO2: 29 mmol/L (ref 22–32)
Calcium: 7 mg/dL — ABNORMAL LOW (ref 8.9–10.3)
Chloride: 99 mmol/L (ref 98–111)
Creatinine, Ser: 5.03 mg/dL — ABNORMAL HIGH (ref 0.44–1.00)
GFR, Estimated: 8 mL/min — ABNORMAL LOW (ref >60)
Glucose, Bld: 94 mg/dL (ref 70–99)
Potassium: 4.3 mmol/L (ref 3.5–5.1)
Sodium: 136 mmol/L (ref 135–145)

## 2020-08-08 LAB — GLUCOSE, CAPILLARY
Glucose-Capillary: 85 mg/dL (ref 70–99)
Glucose-Capillary: 90 mg/dL (ref 70–99)
Glucose-Capillary: 120 mg/dL — ABNORMAL HIGH (ref 70–99)
Glucose-Capillary: 134 mg/dL — ABNORMAL HIGH (ref 70–99)
Glucose-Capillary: 140 mg/dL — ABNORMAL HIGH (ref 70–99)
Glucose-Capillary: 136 mg/dL — ABNORMAL HIGH (ref 70–99)

## 2020-08-08 LAB — VANCOMYCIN, RANDOM: Vancomycin Rm: 21

## 2020-08-08 NOTE — Progress Notes (Signed)
PROGRESS NOTE    [name]  LDJ:[id number] DOB: 11/10/1933 DOA: 08/04/2020 PCP: Idelle Crouch, MD   Brief Narrative:  86 year old patient with history significant for ESRD on HD TTS, CAD status post CABG, CVA with residual right lower extremity weakness, type II DM who presented to the emergency department at the request of her nephrologist given concern for gram-positive bacteremia.  Results of outpatient blood culture were sent to the inpatient nephrology consultant Dr. Juleen China.  We will attempt to have these uploaded into care everywhere.  Preliminary results from blood cultures growing Staphylococcus warneri.  Concerning resistance pattern but is sensitive to vancomycin which she is on  Regarding her foot wound wound and ostomy has evaluated in place dressing.  I have taken a picture and included in the medical record.  I have requested consultation from Dr. Vickki Muff of podiatry.  Will defer any further imaging after surgical evaluation.  11/4: Consultations obtained with infectious disease and podiatry.  Per podiatry no acute need for surgical management.  Recommending outpatient biopsy to rule out pyoderma gangrenosum and consideration for outpatient wound VAC.  Per ID source may be related to indwelling dialysis catheter.  Recommending dialysis catheter removal.  Communicated with nephrology, plan to remove dialysis catheter after HD today.  No fevers noted over interval.  All cultures on admission remain negative.   11/5: Indwelling catheter removed 08/05/2020.  Patient had some anemia and thrombocytopenia noted post catheter removal.  No bleeding noted.  Catheter site looks clean.  All cultures remain negative to date.  11/6: Hemoglobin stable at 7.4.  No indication for transfusion at this time.  No bleeding noted.   Assessment & Plan:   Principal Problem:   Wound infection Active Problems:   Hyperlipidemia   Hypertension   GERD (gastroesophageal reflux disease)   S/P  CABG x 4   CAD (coronary artery disease)   Type 2 diabetes with nephropathy (HCC)   ESRD on hemodialysis (HCC)   History of CVA with residual deficit   Ambulatory dysfunction   Anemia of chronic kidney failure, stage 5 (HCC)   Pressure injury of skin  Dorsal foot wound -Wound culture from 10/15 grew Pseudomonas aeruginosa, Enterococcus faecalis and staph aureus -Etiology of wound is unclear -Infectious disease on consult -Podiatry on consult, no acute surgical management Plan: Continue vancomycin and Zosyn for now Continue wound care Extremity elevation Pain control as needed Outpatient referral to podiatry for skin/ soft tissue biopsy  Staph warneri bacteremia Noted on outpatient blood culture Resistant to oxacillin Currently on vancomycin No fevers over interval Surveillance cultures not demonstrating growth Indwelling catheter removed 08/05/2020 2D echocardiogram with no evidence of vegetation, no indication for TEE Plan: Continue vancomycin for now Follow surveillance cultures, no growth to date Appreciate ID follow-up Plan to replace line on Tuesday, 08/09/2020 Vascular on board    Hypertension -Stable -Continue amlodipine, metoprolol    GERD (gastroesophageal reflux disease) -Chronic and stable.  Continue famotidine    S/P CABG x 4   CAD (coronary artery disease) -Chronic and stable with no complaints of chest pain -Continue aspirin, Plavix, metoprolol.  Statin not on med list  Hypothyroidism -Continue levothyroxine    Type 2 diabetes with nephropathy (HCC) -Sliding scale insulin    ESRD on hemodialysis [facility]) -Nephrology consult for continuation of dialysis    History of CVA with residual right lower extremity weakness   Ambulatory dysfunction -Continue.  Not currently on statin per med list -Patient ambulate with a cane  Anemia of chronic kidney failure, stage 5 (HCC) Thrombocytopenia Worsened after vascular procedure Initial hemoglobin  6.9, repeat hemoglobin 7.3 No bleeding noted Defer transfusion at this time Monitor for any bleeding Check daily CBC Hold chemoprophylaxis     DVT prophylaxis: Subcutaneous heparin Code Status: Full code Family Communication: Husband at bedside Disposition Plan: Status is: Inpatient  Remains inpatient appropriate because:IV treatments appropriate due to intensity of illness or inability to take PO and Inpatient level of care appropriate due to severity of illness   Dispo: The patient is from: Home              Anticipated d/c is to: Home              Anticipated d/c date is: 3 days              Patient currently is not medically stable to d/c.   Staphylococcal bacteremia.  Suspected source hemodialysis catheter.  Catheter has been removed.  Plan to monitor patient in house over the weekend with plans to replace line on 11/ 06/2020  Consultants:   Nephrology  Infectious disease  Podiatry  Vascular surgery  Procedures:   None  Antimicrobials:   Vancomycin  Zosyn   Subjective: Seen and examined.  Husband remains at bedside.  No pain complaints.  Frustration about continued hospital stay.  Objective: Vitals:   08/07/20 2335 08/08/20 0321 08/08/20 0728 08/08/20 1143  BP: 100/79 (!) 116/54 (!) 126/58 (!) 113/51  Pulse: 80 84 87 84  Resp: 17 15    Temp: 98 F (36.7 C) (!) 97.4 F (36.3 C) 99 F (37.2 C) 98 F (36.7 C)  TempSrc: Oral  Oral Oral  SpO2: 95% 96% 98% 98%  Weight:      Height:        Intake/Output Summary (Last 24 hours) at 08/08/2020 1423 Last data filed at 08/08/2020 1355 Gross per 24 hour  Intake 890 ml  Output --  Net 890 ml   Filed Weights   08/03/20 1554 08/04/20 0602  Weight: 84.4 kg 92.4 kg    Examination:  General exam: Appears calm and comfortable  Respiratory system: Clear to auscultation. Respiratory effort normal. Cardiovascular system: S1 & S2 heard, RRR. No JVD, murmurs, rubs, gallops or clicks. No pedal  edema. Gastrointestinal system: Hyperactive bowel sounds, soft, nondistended, no masses Central nervous system: Alert and oriented. No focal neurological deficits. Extremities: Symmetric 5 x 5 power. Skin:  5 x 7 cm ulcerative wound to dorsal aspect of right midfoot Psychiatry: Judgement and insight appear normal. Mood & affect appropriate.     Data Reviewed: I have personally reviewed following labs and imaging studies  CBC: Recent Labs  Lab 08/03/20 1557 08/03/20 1557 08/04/20 0714 08/05/20 0817 08/06/20 0535 08/06/20 1246 08/07/20 0656  WBC 8.8  --  8.5 6.9 4.5  --  4.7  NEUTROABS  --   --   --  4.8 3.0  --  2.8  HGB 8.6*   < > 9.0* 7.8* 6.9* 7.3* 7.4*  HCT 27.0*  --  28.4* 25.0* 22.4*  --  23.9*  MCV 97.1  --  98.6 97.7 97.8  --  96.4  PLT 163  --  132* 116* 92*  --  92*   < > = values in this interval not displayed.   Basic Metabolic Panel: Recent Labs  Lab 08/03/20 1557 08/03/20 1557 08/04/20 0714 08/05/20 0817 08/06/20 0535 08/07/20 0656 08/08/20 0446  NA 133*  --   --  134*  134* 132* 135 136  K 4.7  --   --  4.1  4.1 4.0 3.9 4.3  CL 96*  --   --  99  100 98 97* 99  CO2 26  --   --  24  23 25 27 29   GLUCOSE 272*  --   --  90  97 109* 98 94  BUN 37*  --   --  54*  57* 29* 40* 46*  CREATININE 2.82*   < > 3.33* 3.95*  3.89* 3.27* 4.43* 5.03*  CALCIUM 6.7*  --   --  6.3*  6.4* 6.7* 7.0* 7.0*  PHOS  --   --   --  4.3  --   --   --    < > = values in this interval not displayed.   GFR: Estimated Creatinine Clearance: 9.3 mL/min (A) (by C-G formula based on SCr of 5.03 mg/dL (H)). Liver Function Tests: Recent Labs  Lab 08/03/20 1557 08/05/20 0817  AST 12*  --   ALT 7  --   ALKPHOS 68  --   BILITOT 0.8  --   PROT 6.5  --   ALBUMIN 2.9* 2.2*   Recent Labs  Lab 08/03/20 1557  LIPASE 34   No results for input(s): AMMONIA in the last 168 hours. Coagulation Profile: No results for input(s): INR, PROTIME in the last 168 hours. Cardiac  Enzymes: No results for input(s): CKTOTAL, CKMB, CKMBINDEX, TROPONINI in the last 168 hours. BNP (last 3 results) No results for input(s): PROBNP in the last 8760 hours. HbA1C: No results for input(s): HGBA1C in the last 72 hours. CBG: Recent Labs  Lab 08/07/20 2040 08/07/20 2357 08/08/20 0318 08/08/20 0727 08/08/20 1143  GLUCAP 119* 108* 85 90 120*   Lipid Profile: No results for input(s): CHOL, HDL, LDLCALC, TRIG, CHOLHDL, LDLDIRECT in the last 72 hours. Thyroid Function Tests: No results for input(s): TSH, T4TOTAL, FREET4, T3FREE, THYROIDAB in the last 72 hours. Anemia Panel: No results for input(s): VITAMINB12, FOLATE, FERRITIN, TIBC, IRON, RETICCTPCT in the last 72 hours. Sepsis Labs: Recent Labs  Lab 08/04/20 0309  PROCALCITON 0.41  LATICACIDVEN 1.2    Recent Results (from the past 240 hour(s))  Culture, blood (routine x 2)     Status: None (Preliminary result)   Collection Time: 08/04/20  3:09 AM   Specimen: BLOOD  Result Value Ref Range Status   Specimen Description BLOOD BLOOD LEFT ARM  Final   Special Requests   Final    BOTTLES DRAWN AEROBIC AND ANAEROBIC Blood Culture results may not be optimal due to an inadequate volume of blood received in culture bottles   Culture   Final    NO GROWTH 3 DAYS Performed at [facility], [address] [place], [place] [address]    Report Status PENDING  Incomplete  Culture, blood (routine x 2)     Status: None (Preliminary result)   Collection Time: 08/04/20  3:09 AM   Specimen: BLOOD  Result Value Ref Range Status   Specimen Description BLOOD LEFT ANTECUBITAL  Final   Special Requests   Final    BOTTLES DRAWN AEROBIC AND ANAEROBIC Blood Culture results may not be optimal due to an inadequate volume of blood received in culture bottles   Culture   Final    NO GROWTH 3 DAYS Performed at [facility], [address], [place], [place] [address]    Report Status PENDING  Incomplete  Respiratory  Panel by RT PCR (Flu A&B, Covid) - Nasopharyngeal Swab     Status: None   Collection Time: 08/04/20  3:09 AM   Specimen: Nasopharyngeal Swab  Result Value Ref Range Status   SARS Coronavirus 2 by RT PCR NEGATIVE NEGATIVE Final    Comment: (NOTE) SARS-CoV-2 target nucleic acids are NOT DETECTED.  The SARS-CoV-2 RNA is generally detectable in upper respiratoy specimens during the acute phase of infection. The lowest concentration of SARS-CoV-2 viral copies this assay can detect is 131 copies/mL. A negative result does not preclude SARS-Cov-2 infection and should not be used as the sole basis for treatment or other patient management decisions. A negative result may occur with  improper specimen collection/handling, submission of specimen other than nasopharyngeal swab, presence of viral mutation(s) within the areas targeted by this assay, and inadequate number of viral copies (<131 copies/mL). A negative result must be combined with clinical observations, patient history, and epidemiological information. The expected result is Negative.  Fact Sheet for Patients:  PinkCheek.be  Fact Sheet for Healthcare Providers:  GravelBags.it  This test is no t yet approved or cleared by the [place] FDA and  has been authorized for detection and/or diagnosis of SARS-CoV-2 by FDA under an Emergency Use Authorization (EUA). This EUA will remain  in effect (meaning this test can be used) for the duration of the COVID-19 declaration under Section 564(b)(1) of the Act, 21 U.S.C. section 360bbb-3(b)(1), unless the authorization is terminated or revoked sooner.     Influenza A by PCR NEGATIVE NEGATIVE Final   Influenza B by PCR NEGATIVE NEGATIVE Final    Comment: (NOTE) The Xpert Xpress SARS-CoV-2/FLU/RSV assay is intended as an aid in  the diagnosis of influenza from Nasopharyngeal swab specimens and  should not be used as a sole basis  for treatment. Nasal washings and  aspirates are unacceptable for Xpert Xpress SARS-CoV-2/FLU/RSV  testing.  Fact Sheet for Patients: PinkCheek.be  Fact Sheet for Healthcare Providers: GravelBags.it  This test is not yet approved or cleared by the [place] FDA and  has been authorized for detection and/or diagnosis of SARS-CoV-2 by  FDA under an Emergency Use Authorization (EUA). This EUA will remain  in effect (meaning this test can be used) for the duration of the  Covid-19 declaration under Section 564(b)(1) of the Act, 21  U.S.C. section 360bbb-3(b)(1), unless the authorization is  terminated or revoked. Performed at [facility], [address] [place], [place] [address]   CULTURE, BLOOD (ROUTINE X 2) w Reflex to ID Panel     Status: None (Preliminary result)   Collection Time: 08/06/20 12:17 AM   Specimen: BLOOD  Result Value Ref Range Status   Specimen Description BLOOD BLOOD LEFT HAND  Final   Special Requests   Final    BOTTLES DRAWN AEROBIC AND ANAEROBIC Blood Culture results may not be optimal due to an inadequate volume of blood received in culture bottles   Culture   Final    NO GROWTH 1 DAY Performed at [facility], [address], [place], [place] [address]    Report Status PENDING  Incomplete  Culture, blood (Routine X 2) w Reflex to ID Panel     Status: None (Preliminary result)   Collection Time: 08/06/20  5:35 AM   Specimen: BLOOD  Result Value Ref Range Status   Specimen Description BLOOD RIGHT ANTECUBITAL  Final   Special Requests   Final    BOTTLES DRAWN AEROBIC AND ANAEROBIC Blood  Culture adequate volume   Culture   Final    NO GROWTH 1 DAY Performed at [facility], [address], [place], [place] [address]    Report Status PENDING  Incomplete         Radiology Studies: No results found.      Scheduled Meds: . sodium chloride   Intravenous  Once  . amLODipine  5 mg Oral Daily  . calcium carbonate  2 tablet Oral BID  . Chlorhexidine Gluconate Cloth  6 each Topical Q0600  . clopidogrel  75 mg Oral Daily  . [START ON 08/10/2020] epoetin (EPOGEN/PROCRIT) injection  4,000 Units Intravenous Q T,Th,Sa-HD  . feeding supplement (NEPRO CARB STEADY)  237 mL Oral BID BM  . furosemide  20 mg Intravenous Once  . gabapentin  200 mg Oral BID  . insulin aspart  0-5 Units Subcutaneous QHS  . insulin aspart  0-9 Units Subcutaneous TID WC  . levothyroxine  150 mcg Oral Q0600  . metoprolol tartrate  50 mg Oral BID  . pneumococcal 23 valent vaccine  0.5 mL Intramuscular Tomorrow-1000  . torsemide  100 mg Oral Daily  . vancomycin variable dose per unstable renal function (pharmacist dosing)   Does not apply See admin instructions   Continuous Infusions: . piperacillin-tazobactam (ZOSYN)  IV 2.25 g (08/08/20 1354)     LOS: 4 days    Time spent: 15 minutes    Sidney Ace, MD [facility] Pager [phone]  If 7PM-7AM, please contact night-coverage 08/08/2020, 2:23 PM

## 2020-08-08 NOTE — Progress Notes (Signed)
Mobility Specialist - Progress Note   08/08/20 1421  Mobility  Activity Refused mobility  Mobility performed by Mobility specialist    Pt politely declined session at this time. States she has "10/10" pain in her feet. Nurse was notified. Will hold and re-attempt at a later date/time.    Leonilda Cozby Mobility Specialist  08/08/20, 2:22 PM

## 2020-08-08 NOTE — Progress Notes (Signed)
[facility]  ROUNDING NOTE   Subjective:   Patient admitted for concerns of bloodstream infection. Right IJ PermCath has been removed Blood cultures are negative from November 5 and November 3 Patient denies any acute shortness of breath Asking about going home  Objective:  Vital signs in last 24 hours:  Temp:  [97.4 F (36.3 C)-99 F (37.2 C)] 98 F (36.7 C) (11/07 1143) Pulse Rate:  [80-96] 84 (11/07 1143) Resp:  [15-20] 15 (11/07 0321) BP: (100-126)/(51-82) 113/51 (11/07 1143) SpO2:  [94 %-98 %] 98 % (11/07 1143)  Weight change:  Filed Weights   08/03/20 1554 08/04/20 0602  Weight: 84.4 kg 92.4 kg    Intake/Output: I/O last 3 completed shifts: In: 860 [P.O.:810; IV Piggyback:50] Out: 0    Intake/Output this shift:  Total I/O In: 240 [P.O.:240] Out: -   Physical Exam: General: In no acute distress  Head: Normocephalic,Atraumatic  Eyes: Anicteric  Lungs:  Normal and symmetrical respiratory effort,Lungs clear  Heart: Regular rate and rhythm  Abdomen:  Soft, nontender, non distended  Extremities:  2+peripheral edema, lower legs wrapped with dressing  Neurologic: Awake,alert,oriented  Skin: Bilateral lower legs with dressing  Access: Rt IJ Permcath discontinued on 16/04/3709    Basic Metabolic Panel: Recent Labs  Lab 08/03/20 1557 08/03/20 1557 08/04/20 0714 08/05/20 0817 08/05/20 0817 08/06/20 0535 08/07/20 0656 08/08/20 0446  NA 133*  --   --  134*  134*  --  132* 135 136  K 4.7  --   --  4.1  4.1  --  4.0 3.9 4.3  CL 96*  --   --  99  100  --  98 97* 99  CO2 26  --   --  24  23  --  25 27 29   GLUCOSE 272*  --   --  90  97  --  109* 98 94  BUN 37*  --   --  54*  57*  --  29* 40* 46*  CREATININE 2.82*   < > 3.33* 3.95*  3.89*  --  3.27* 4.43* 5.03*  CALCIUM 6.7*   < >  --  6.3*  6.4*   < > 6.7* 7.0* 7.0*  PHOS  --   --   --  4.3  --   --   --   --    < > = values in this interval not displayed.    Liver Function  Tests: Recent Labs  Lab 08/03/20 1557 08/05/20 0817  AST 12*  --   ALT 7  --   ALKPHOS 68  --   BILITOT 0.8  --   PROT 6.5  --   ALBUMIN 2.9* 2.2*   Recent Labs  Lab 08/03/20 1557  LIPASE 34   No results for input(s): AMMONIA in the last 168 hours.  CBC: Recent Labs  Lab 08/03/20 1557 08/03/20 1557 08/04/20 0714 08/05/20 0817 08/06/20 0535 08/06/20 1246 08/07/20 0656  WBC 8.8  --  8.5 6.9 4.5  --  4.7  NEUTROABS  --   --   --  4.8 3.0  --  2.8  HGB 8.6*   < > 9.0* 7.8* 6.9* 7.3* 7.4*  HCT 27.0*  --  28.4* 25.0* 22.4*  --  23.9*  MCV 97.1  --  98.6 97.7 97.8  --  96.4  PLT 163  --  132* 116* 92*  --  92*   < > = values in this interval not displayed.  Cardiac Enzymes: No results for input(s): CKTOTAL, CKMB, CKMBINDEX, TROPONINI in the last 168 hours.  BNP: Invalid input(s): POCBNP  CBG: Recent Labs  Lab 08/07/20 2040 08/07/20 2357 08/08/20 0318 08/08/20 0727 08/08/20 1143  GLUCAP 119* 108* 85 90 120*    Microbiology: Results for orders placed or performed during the hospital encounter of 08/04/20  Culture, blood (routine x 2)     Status: None (Preliminary result)   Collection Time: 08/04/20  3:09 AM   Specimen: BLOOD  Result Value Ref Range Status   Specimen Description BLOOD BLOOD LEFT ARM  Final   Special Requests   Final    BOTTLES DRAWN AEROBIC AND ANAEROBIC Blood Culture results may not be optimal due to an inadequate volume of blood received in culture bottles   Culture   Final    NO GROWTH 3 DAYS Performed at [facility], [address], [place], [place] [address]    Report Status PENDING  Incomplete  Culture, blood (routine x 2)     Status: None (Preliminary result)   Collection Time: 08/04/20  3:09 AM   Specimen: BLOOD  Result Value Ref Range Status   Specimen Description BLOOD LEFT ANTECUBITAL  Final   Special Requests   Final    BOTTLES DRAWN AEROBIC AND ANAEROBIC Blood Culture results may not be optimal due to an  inadequate volume of blood received in culture bottles   Culture   Final    NO GROWTH 3 DAYS Performed at [facility], [address], [place], [place] [address]    Report Status PENDING  Incomplete  Respiratory Panel by RT PCR (Flu A&B, Covid) - Nasopharyngeal Swab     Status: None   Collection Time: 08/04/20  3:09 AM   Specimen: Nasopharyngeal Swab  Result Value Ref Range Status   SARS Coronavirus 2 by RT PCR NEGATIVE NEGATIVE Final    Comment: (NOTE) SARS-CoV-2 target nucleic acids are NOT DETECTED.  The SARS-CoV-2 RNA is generally detectable in upper respiratoy specimens during the acute phase of infection. The lowest concentration of SARS-CoV-2 viral copies this assay can detect is 131 copies/mL. A negative result does not preclude SARS-Cov-2 infection and should not be used as the sole basis for treatment or other patient management decisions. A negative result may occur with  improper specimen collection/handling, submission of specimen other than nasopharyngeal swab, presence of viral mutation(s) within the areas targeted by this assay, and inadequate number of viral copies (<131 copies/mL). A negative result must be combined with clinical observations, patient history, and epidemiological information. The expected result is Negative.  Fact Sheet for Patients:  PinkCheek.be  Fact Sheet for Healthcare Providers:  GravelBags.it  This test is no t yet approved or cleared by the [place] FDA and  has been authorized for detection and/or diagnosis of SARS-CoV-2 by FDA under an Emergency Use Authorization (EUA). This EUA will remain  in effect (meaning this test can be used) for the duration of the COVID-19 declaration under Section 564(b)(1) of the Act, 21 U.S.C. section 360bbb-3(b)(1), unless the authorization is terminated or revoked sooner.     Influenza A by PCR NEGATIVE NEGATIVE Final    Influenza B by PCR NEGATIVE NEGATIVE Final    Comment: (NOTE) The Xpert Xpress SARS-CoV-2/FLU/RSV assay is intended as an aid in  the diagnosis of influenza from Nasopharyngeal swab specimens and  should not be used as a sole basis for treatment. Nasal washings and  aspirates are unacceptable for Xpert Xpress  SARS-CoV-2/FLU/RSV  testing.  Fact Sheet for Patients: PinkCheek.be  Fact Sheet for Healthcare Providers: GravelBags.it  This test is not yet approved or cleared by the [place] FDA and  has been authorized for detection and/or diagnosis of SARS-CoV-2 by  FDA under an Emergency Use Authorization (EUA). This EUA will remain  in effect (meaning this test can be used) for the duration of the  Covid-19 declaration under Section 564(b)(1) of the Act, 21  U.S.C. section 360bbb-3(b)(1), unless the authorization is  terminated or revoked. Performed at [facility], [address] [place], [place] [address]   CULTURE, BLOOD (ROUTINE X 2) w Reflex to ID Panel     Status: None (Preliminary result)   Collection Time: 08/06/20 12:17 AM   Specimen: BLOOD  Result Value Ref Range Status   Specimen Description BLOOD BLOOD LEFT HAND  Final   Special Requests   Final    BOTTLES DRAWN AEROBIC AND ANAEROBIC Blood Culture results may not be optimal due to an inadequate volume of blood received in culture bottles   Culture   Final    NO GROWTH 1 DAY Performed at [facility], [address] [place], [place] [address]    Report Status PENDING  Incomplete  Culture, blood (Routine X 2) w Reflex to ID Panel     Status: None (Preliminary result)   Collection Time: 08/06/20  5:35 AM   Specimen: BLOOD  Result Value Ref Range Status   Specimen Description BLOOD RIGHT ANTECUBITAL  Final   Special Requests   Final    BOTTLES DRAWN AEROBIC AND ANAEROBIC Blood Culture adequate volume   Culture   Final    NO GROWTH 1  DAY Performed at [facility], [address] [place], [place] [address]    Report Status PENDING  Incomplete    Coagulation Studies: No results for input(s): LABPROT, INR in the last 72 hours.  Urinalysis: No results for input(s): COLORURINE, LABSPEC, PHURINE, GLUCOSEU, HGBUR, BILIRUBINUR, KETONESUR, PROTEINUR, UROBILINOGEN, NITRITE, LEUKOCYTESUR in the last 72 hours.  Invalid input(s): APPERANCEUR    Imaging: No results found.   Medications:   . piperacillin-tazobactam (ZOSYN)  IV 2.25 g (08/08/20 0457)   . sodium chloride   Intravenous Once  . amLODipine  5 mg Oral Daily  . calcium carbonate  2 tablet Oral BID  . Chlorhexidine Gluconate Cloth  6 each Topical Q0600  . clopidogrel  75 mg Oral Daily  . [START ON 08/10/2020] epoetin (EPOGEN/PROCRIT) injection  4,000 Units Intravenous Q T,Th,Sa-HD  . feeding supplement (NEPRO CARB STEADY)  237 mL Oral BID BM  . furosemide  20 mg Intravenous Once  . gabapentin  200 mg Oral BID  . insulin aspart  0-5 Units Subcutaneous QHS  . insulin aspart  0-9 Units Subcutaneous TID WC  . levothyroxine  150 mcg Oral Q0600  . metoprolol tartrate  50 mg Oral BID  . pneumococcal 23 valent vaccine  0.5 mL Intramuscular Tomorrow-1000  . torsemide  100 mg Oral Daily  . vancomycin variable dose per unstable renal function (pharmacist dosing)   Does not apply See admin instructions   diphenoxylate-atropine, loperamide, ondansetron (ZOFRAN) IV, oxyCODONE  Assessment/ Plan:  [name] is a 86 y.o.  female  well known to our practice. Patient was sent in to ED from dialysis clinic for bacteremia. Cultures growing Staph Warneri  [facility] TTS [place] IJ permcath 91.5kg  #ESRD on dialysis TTS  Patient received dialysis treatment  Thursday Volume and electrolyte status acceptable Rt.IJ Permcath discontinued this admission due to bacteremia, allowing a catheter free period Plan for hemodialysis on Tuesday after new  PermCath is available  #Bacteremia/sepsis Currently getting treatment with Zosyn and vancomycin  #Secondary Hyperparathyroidism with hypocalemia.  Lab Results  Component Value Date   PTH 163 (H) 06/25/2020   CALCIUM 7.0 (L) 08/08/2020   CAION 1.18 09/06/2012   PHOS 4.3 08/05/2020    Continue Calcium Carbonate Will monitor bone mineral metabolism parameters  # Anemia of CKD Lab Results  Component Value Date   HGB 7.4 (L) 08/07/2020   Monitor EPO with HD    LOS: 4 Raman Featherston 11/7/202112:22 PM

## 2020-08-08 NOTE — Progress Notes (Signed)
Pharmacy Antibiotic Note  [name] is a 86 y.o. female admitted on 08/04/2020 with cellulitis to dorsal aspect of right foot followed at the wound care clinic. Pharmacy was consulted for vancomycin and Zosyn dosing. Pt is on HD every T-Th-Sat. She has a h/o cellulitis with wound cultures containing multiple organisms (pseudomonas, enterococcus, MSSA). Outpatient blood culture reported positive for Staph warneri resistant to oxacillin. Potential source could be dialysis catheter which was removed 11/4. Bcx from 11/3 and 11/5 currently NGTD.   Plan: 1) Patient does not have port access at this time; MD planning to replace PermCath on Monday (11/8).   Patient's last dose of vancomycin was 1000mg  on 11/4  11/7 VT at 0446 21 - no dose indicated at this time; unlikely patient will clear much of the vanc, will consider getting another vanc level on 11/9. Nephro is planning for hemodialysis on Tuesday (11/9) after new PermCath is available  Continue variable vancomycin dosing based on levels  Per ID, continue vancomycin during dialysis for 4 weeks total  2) Continue Zosyn 2.25 grams IV every 8 hours   Height: 5\' 4"  (162.6 cm) Weight: 92.4 kg (203 lb 11.3 oz) IBW/kg (Calculated) : 54.7  Temp (24hrs), Avg:98.1 F (36.7 C), Min:97.4 F (36.3 C), Max:99 F (37.2 C)  Recent Labs  Lab 08/03/20 1557 08/03/20 1557 08/04/20 0309 08/04/20 0714 08/05/20 0817 08/06/20 0535 08/07/20 0656 08/08/20 0446  WBC 8.8  --   --  8.5 6.9 4.5 4.7  --   CREATININE 2.82*   < >  --  3.33* 3.95*  3.89* 3.27* 4.43* 5.03*  LATICACIDVEN  --   --  1.2  --   --   --   --   --   VANCORANDOM  --   --   --   --   --   --   --  21   < > = values in this interval not displayed.    Estimated Creatinine Clearance: 9.3 mL/min (A) (by C-G formula based on SCr of 5.03 mg/dL (H)).    Allergies  Allergen Reactions  . Clonidine Other (See Comments) and Shortness Of Breath    Other reaction(s): Other (see  comments) Other reaction(s): Other (See Comments)  . Darvocet [Propoxyphene N-Acetaminophen] Anaphylaxis  . Hydralazine     Other reaction(s): Other (See Comments) glomerulonephritis  . Hydralazine Hcl Other (See Comments)    glomerulonephritis  . Esomeprazole     Other reaction(s): Unknown  . Guaifenesin     Other reaction(s): Unknown Other reaction(s): Unknown Other reaction(s): Unknown Other reaction(s): Unknown  . Phenylephrine-Guaifenesin     Other reaction(s): Unknown  . Rabeprazole     Other reaction(s): Unknown  . Telithromycin     Other reaction(s): Unknown Other reaction(s): Unknown Other reaction(s): Unknown  . Fluocinolone Other (See Comments)    Feels crazy Other reaction(s): Other (see comments) Other reaction(s): Other (See Comments) Feels crazy Feels crazy  . Iodinated Diagnostic Agents Nausea And Vomiting and Other (See Comments)    Burning Other reaction(s): Other (see comments) Other reaction(s): Other (See Comments) Burning Nausea & vomiting  . Levaquin [Levofloxacin] Nausea Only  . Statins Other (See Comments)    Myalgia Other reaction(s): Other (See Comments), Unknown, Unknown Myalgia    Antimicrobials this admission: ceftazidime x 1 vancomycin 11/3 >>  Zosyn 11/3  >>   Microbiology results: 11/3, 11/5 BCx: NGTD 10/21 Pseudomonas, E faecalis, S aureus 11/3 SARS CoV-2: negative 11/3 influenza A/B: negative  Thank you for allowing pharmacy to be a part of this patient's care.  Sherilyn Banker, PharmD Pharmacy Resident  08/08/2020 1:44 PM

## 2020-08-09 ENCOUNTER — Other Ambulatory Visit (INDEPENDENT_AMBULATORY_CARE_PROVIDER_SITE_OTHER): Payer: Self-pay | Admitting: Vascular Surgery

## 2020-08-09 DIAGNOSIS — D649 Anemia, unspecified: Secondary | ICD-10-CM

## 2020-08-09 DIAGNOSIS — L97518 Non-pressure chronic ulcer of other part of right foot with other specified severity: Secondary | ICD-10-CM | POA: Diagnosis not present

## 2020-08-09 DIAGNOSIS — L089 Local infection of the skin and subcutaneous tissue, unspecified: Secondary | ICD-10-CM | POA: Diagnosis not present

## 2020-08-09 DIAGNOSIS — R7881 Bacteremia: Secondary | ICD-10-CM | POA: Diagnosis not present

## 2020-08-09 DIAGNOSIS — N186 End stage renal disease: Secondary | ICD-10-CM | POA: Diagnosis not present

## 2020-08-09 DIAGNOSIS — T148XXA Other injury of unspecified body region, initial encounter: Secondary | ICD-10-CM | POA: Diagnosis not present

## 2020-08-09 LAB — GLUCOSE, CAPILLARY
Glucose-Capillary: 95 mg/dL (ref 70–99)
Glucose-Capillary: 87 mg/dL (ref 70–99)
Glucose-Capillary: 121 mg/dL — ABNORMAL HIGH (ref 70–99)
Glucose-Capillary: 105 mg/dL — ABNORMAL HIGH (ref 70–99)
Glucose-Capillary: 119 mg/dL — ABNORMAL HIGH (ref 70–99)
Glucose-Capillary: 129 mg/dL — ABNORMAL HIGH (ref 70–99)

## 2020-08-09 LAB — CULTURE, BLOOD (ROUTINE X 2)
Culture: NO GROWTH
Culture: NO GROWTH

## 2020-08-09 LAB — BASIC METABOLIC PANEL
Anion gap: 11 (ref 5–15)
BUN: 52 mg/dL — ABNORMAL HIGH (ref 8–23)
CO2: 27 mmol/L (ref 22–32)
Calcium: 7 mg/dL — ABNORMAL LOW (ref 8.9–10.3)
Chloride: 97 mmol/L — ABNORMAL LOW (ref 98–111)
Creatinine, Ser: 5.75 mg/dL — ABNORMAL HIGH (ref 0.44–1.00)
GFR, Estimated: 7 mL/min — ABNORMAL LOW (ref >60)
Glucose, Bld: 109 mg/dL — ABNORMAL HIGH (ref 70–99)
Potassium: 4 mmol/L (ref 3.5–5.1)
Sodium: 135 mmol/L (ref 135–145)

## 2020-08-09 NOTE — Progress Notes (Signed)
Date of Admission:  08/04/2020      ID: [name] is a 86 y.o. female  Principal Problem:   Wound infection Active Problems:   Hyperlipidemia   Hypertension   GERD (gastroesophageal reflux disease)   S/P CABG x 4   CAD (coronary artery disease)   Type 2 diabetes with nephropathy (HCC)   ESRD on hemodialysis ([place])   History of CVA with residual deficit   Ambulatory dysfunction   Anemia of chronic kidney failure, stage 5 (HCC)   Pressure injury of skin    Subjective: Pt has no specific complaint UNNA boot placed on 08/06/20- will be changed tomorrow  Medications:  . sodium chloride   Intravenous Once  . amLODipine  5 mg Oral Daily  . calcium carbonate  2 tablet Oral BID  . Chlorhexidine Gluconate Cloth  6 each Topical Q0600  . clopidogrel  75 mg Oral Daily  . [START ON 08/10/2020] epoetin (EPOGEN/PROCRIT) injection  4,000 Units Intravenous Q T,Th,Sa-HD  . feeding supplement (NEPRO CARB STEADY)  237 mL Oral BID BM  . furosemide  20 mg Intravenous Once  . gabapentin  200 mg Oral BID  . insulin aspart  0-5 Units Subcutaneous QHS  . insulin aspart  0-9 Units Subcutaneous TID WC  . levothyroxine  150 mcg Oral Q0600  . metoprolol tartrate  50 mg Oral BID  . pneumococcal 23 valent vaccine  0.5 mL Intramuscular Tomorrow-1000  . torsemide  100 mg Oral Daily  . vancomycin variable dose per unstable renal function (pharmacist dosing)   Does not apply See admin instructions    Objective: Vital signs in last 24 hours: Temp:  [97.7 F (36.5 C)-98.6 F (37 C)] 97.8 F (36.6 C) (11/08 0743) Pulse Rate:  [82-90] 89 (11/08 0743) Resp:  [15-20] 16 (11/08 0743) BP: (105-113)/(51-61) 105/61 (11/08 0743) SpO2:  [94 %-98 %] 94 % (11/08 0743)  PHYSICAL EXAM:  General: Alert, cooperative, no distress, appears stated age.  Lungs:b/l air entry Heart: Regular rate and rhythm, no murmur, rub or gallop. Abdomen: Soft, non-tender,not distended. Bowel sounds normal. No  masses Extremities:rt foot unna boot not removoved  dorsum- ulcer   Skin: No rashes or lesions. Or bruising Lymph: Cervical, supraclavicular normal. Neurologic: Grossly non-focal  Lab Results Recent Labs    08/06/20 1246 08/07/20 0656 08/07/20 0656 08/08/20 0446 08/09/20 0409  WBC  --  4.7  --   --   --   HGB 7.3* 7.4*  --   --   --   HCT  --  23.9*  --   --   --   NA  --  135   < > 136 135  K  --  3.9   < > 4.3 4.0  CL  --  97*   < > 99 97*  CO2  --  27   < > 29 27  BUN  --  40*   < > 46* 52*  CREATININE  --  4.43*   < > 5.03* 5.75*   < > = values in this interval not displayed.   Liver Panel No results for input(s): PROT, ALBUMIN, AST, ALT, ALKPHOS, BILITOT, BILIDIR, IBILI in the last 72 hours.  Assessment/Plan: Staph warneri bacteremia in a patient with ESRD -dialysis catheter removed , repeat cultures neg 2 d echo normal valves- no need for TEE Repeat blood culture 11/3 and 11/5 neg -continue vanco during dialysis for 4 weeks total awaiting new permacath tomorrow  Rt  foot wound- on dorsum of the foot- unclear etiology- culture done in wound clinic on 10/15 is pseudomonas, enterococcus, MSSA On  Zosyn Would change to ceftazidime on discharge so as to give it during dialysis for a total of 2-4 weeks ( depending on healing) Seen by podiatrist Agree with skin /tissue biopsy as OP to r/o pyoderma gangrenosum Currently has Unna boot- will see the wound tomorrow with the [facility]. ? ?CAD s/p CABG ? _Anemia due to ESRD  Discussed the management with the patient and her husband

## 2020-08-09 NOTE — Progress Notes (Signed)
PT Cancellation Note  Patient Details Name: [name] MRN: [id number] DOB: [birth year]   Cancelled Treatment:    Reason Eval/Treat Not Completed: Fatigue/lethargy limiting ability to participate   Pt reported general fatigue this pm.  Stated she was up already today but did not feel like she could participate more at this time.  Will continue as appropriate.    Chesley Noon 08/09/2020, 3:21 PM

## 2020-08-09 NOTE — Progress Notes (Signed)
Mobility Specialist - Progress Note   08/09/20 1255  Mobility  Activity Transferred:  Chair to bed  Level of Assistance Minimal assist, patient does 75% or more  Assistive Device Front wheel walker  Distance Ambulated (ft) 5 ft  Mobility Response Tolerated well  Mobility performed by Mobility specialist  $Mobility charge 1 Mobility    Pre-mobility: 93 HR, 97% SpO2 Post-mobility: 88 HR, 97% SpO2   Pt was sitting in recliner upon arrival. Pt agreed to session. Pt c/o pain in RLE "8/10" and requested assistance getting back to bed. Pt was able to stand to RW with minA, CGA used for safety precautions. Pt began ambulating 5' toward bedside when she stated she "feels numb in R leg". VC were used to shift weight over to LLE to offset pain and numbness on RLE. Pt carried over commands well. Pt sat EOB with minA and needed LE support to transition EOB-supine. Overall, pt tolerated session well. Pt was left in bed with all needs in reach and alarm set. Nurse was notified.    Kathee Delton Mobility Specialist 08/09/20, 1:01 PM

## 2020-08-09 NOTE — Care Management Important Message (Signed)
Important Message  Patient Details  Name: [name] MRN: [id number] Date of Birth: [birth year]   Medicare Important Message Given:  N/A - LOS <3 / Initial given by admissions     [name] A Dede Dobesh 08/09/2020, 8:16 AM

## 2020-08-09 NOTE — Care Plan (Signed)
Consent for permcath placement obtained at this time. Writer witnessed. Placed in chart.

## 2020-08-09 NOTE — Progress Notes (Signed)
[facility]  ROUNDING NOTE   Subjective:   Patient sitting on a recliner, enjoying her birthday, with her husband at the bedside. She appears pleasant and comfortable.Denies fever,chills,SOB, nausea or vomiting.   Objective:  Vital signs in last 24 hours:  Temp:  [97.7 F (36.5 C)-98.6 F (37 C)] 98.3 F (36.8 C) (11/08 1100) Pulse Rate:  [71-90] 71 (11/08 1100) Resp:  [15-20] 16 (11/08 1100) BP: (98-110)/(51-61) 98/56 (11/08 1100) SpO2:  [94 %-97 %] 94 % (11/08 0744)  Weight change:  Filed Weights   08/03/20 1554 08/04/20 0602  Weight: 84.4 kg 92.4 kg    Intake/Output: I/O last 3 completed shifts: In: 1300 [P.O.:1200; IV Piggyback:100] Out: -    Intake/Output this shift:  No intake/output data recorded.  Physical Exam: General: Sitting up on a recliner, in no acute distress  Head: Normocephalic,Atraumatic  Eyes: Anicteric  Lungs:  Lungs with fine crackles at the bases, respirations symmetrical,unlabored  Heart: S1S2, no rubs or gallops  Abdomen:  Soft, nontender, non distended  Extremities:  2+peripheral edema, lower legs wrapped with dressing  Neurologic: Awake,alert,oriented  Skin: Bilateral lower legs with dressing  Access: To be established    Basic Metabolic Panel: Recent Labs  Lab 08/05/20 0817 08/05/20 0817 08/06/20 0535 08/06/20 0535 08/07/20 0656 08/08/20 0446 08/09/20 0409  NA 134*  134*  --  132*  --  135 136 135  K 4.1  4.1  --  4.0  --  3.9 4.3 4.0  CL 99  100  --  98  --  97* 99 97*  CO2 24  23  --  25  --  27 29 27   GLUCOSE 90  97  --  109*  --  98 94 109*  BUN 54*  57*  --  29*  --  40* 46* 52*  CREATININE 3.95*  3.89*  --  3.27*  --  4.43* 5.03* 5.75*  CALCIUM 6.3*  6.4*   < > 6.7*   < > 7.0* 7.0* 7.0*  PHOS 4.3  --   --   --   --   --   --    < > = values in this interval not displayed.    Liver Function Tests: Recent Labs  Lab 08/03/20 1557 08/05/20 0817  AST 12*  --   ALT 7  --   ALKPHOS 68  --    BILITOT 0.8  --   PROT 6.5  --   ALBUMIN 2.9* 2.2*   Recent Labs  Lab 08/03/20 1557  LIPASE 34   No results for input(s): AMMONIA in the last 168 hours.  CBC: Recent Labs  Lab 08/03/20 1557 08/03/20 1557 08/04/20 0714 08/05/20 0817 08/06/20 0535 08/06/20 1246 08/07/20 0656  WBC 8.8  --  8.5 6.9 4.5  --  4.7  NEUTROABS  --   --   --  4.8 3.0  --  2.8  HGB 8.6*   < > 9.0* 7.8* 6.9* 7.3* 7.4*  HCT 27.0*  --  28.4* 25.0* 22.4*  --  23.9*  MCV 97.1  --  98.6 97.7 97.8  --  96.4  PLT 163  --  132* 116* 92*  --  92*   < > = values in this interval not displayed.    Cardiac Enzymes: No results for input(s): CKTOTAL, CKMB, CKMBINDEX, TROPONINI in the last 168 hours.  BNP: Invalid input(s): POCBNP  CBG: Recent Labs  Lab 08/08/20 1936 08/08/20 2326 08/09/20 0402 08/09/20  0741 08/09/20 1138  GLUCAP 140* 136* 95 87 121*    Microbiology: Results for orders placed or performed during the hospital encounter of 08/04/20  Culture, blood (routine x 2)     Status: None   Collection Time: 08/04/20  3:09 AM   Specimen: BLOOD  Result Value Ref Range Status   Specimen Description BLOOD BLOOD LEFT ARM  Final   Special Requests   Final    BOTTLES DRAWN AEROBIC AND ANAEROBIC Blood Culture results may not be optimal due to an inadequate volume of blood received in culture bottles   Culture   Final    NO GROWTH 5 DAYS Performed at [facility], [address], [place], [place] [address]    Report Status 08/09/2020 FINAL  Final  Culture, blood (routine x 2)     Status: None   Collection Time: 08/04/20  3:09 AM   Specimen: BLOOD  Result Value Ref Range Status   Specimen Description BLOOD LEFT ANTECUBITAL  Final   Special Requests   Final    BOTTLES DRAWN AEROBIC AND ANAEROBIC Blood Culture results may not be optimal due to an inadequate volume of blood received in culture bottles   Culture   Final    NO GROWTH 5 DAYS Performed at [facility], [address], [place], [place] [address]    Report Status 08/09/2020 FINAL  Final  Respiratory Panel by RT PCR (Flu A&B, Covid) - Nasopharyngeal Swab     Status: None   Collection Time: 08/04/20  3:09 AM   Specimen: Nasopharyngeal Swab  Result Value Ref Range Status   SARS Coronavirus 2 by RT PCR NEGATIVE NEGATIVE Final    Comment: (NOTE) SARS-CoV-2 target nucleic acids are NOT DETECTED.  The SARS-CoV-2 RNA is generally detectable in upper respiratoy specimens during the acute phase of infection. The lowest concentration of SARS-CoV-2 viral copies this assay can detect is 131 copies/mL. A negative result does not preclude SARS-Cov-2 infection and should not be used as the sole basis for treatment or other patient management decisions. A negative result may occur with  improper specimen collection/handling, submission of specimen other than nasopharyngeal swab, presence of viral mutation(s) within the areas targeted by this assay, and inadequate number of viral copies (<131 copies/mL). A negative result must be combined with clinical observations, patient history, and epidemiological information. The expected result is Negative.  Fact Sheet for Patients:  PinkCheek.be  Fact Sheet for Healthcare Providers:  GravelBags.it  This test is no t yet approved or cleared by the [place] FDA and  has been authorized for detection and/or diagnosis of SARS-CoV-2 by FDA under an Emergency Use Authorization (EUA). This EUA will remain  in effect (meaning this test can be used) for the duration of the COVID-19 declaration under Section 564(b)(1) of the Act, 21 U.S.C. section 360bbb-3(b)(1), unless the authorization is terminated or revoked sooner.     Influenza A by PCR NEGATIVE NEGATIVE Final   Influenza B by PCR NEGATIVE NEGATIVE Final    Comment: (NOTE) The Xpert Xpress SARS-CoV-2/FLU/RSV assay is intended as an aid in  the diagnosis  of influenza from Nasopharyngeal swab specimens and  should not be used as a sole basis for treatment. Nasal washings and  aspirates are unacceptable for Xpert Xpress SARS-CoV-2/FLU/RSV  testing.  Fact Sheet for Patients: PinkCheek.be  Fact Sheet for Healthcare Providers: GravelBags.it  This test is not yet approved or cleared by the [place] and  has been authorized  for detection and/or diagnosis of SARS-CoV-2 by  FDA under an Emergency Use Authorization (EUA). This EUA will remain  in effect (meaning this test can be used) for the duration of the  Covid-19 declaration under Section 564(b)(1) of the Act, 21  U.S.C. section 360bbb-3(b)(1), unless the authorization is  terminated or revoked. Performed at [facility], [address] [place], [place] [address]   CULTURE, BLOOD (ROUTINE X 2) w Reflex to ID Panel     Status: None (Preliminary result)   Collection Time: 08/06/20 12:17 AM   Specimen: BLOOD  Result Value Ref Range Status   Specimen Description BLOOD BLOOD LEFT HAND  Final   Special Requests   Final    BOTTLES DRAWN AEROBIC AND ANAEROBIC Blood Culture results may not be optimal due to an inadequate volume of blood received in culture bottles   Culture   Final    NO GROWTH 3 DAYS Performed at [facility], [address], [place], [place] [address]    Report Status PENDING  Incomplete  Culture, blood (Routine X 2) w Reflex to ID Panel     Status: None (Preliminary result)   Collection Time: 08/06/20  5:35 AM   Specimen: BLOOD  Result Value Ref Range Status   Specimen Description BLOOD RIGHT ANTECUBITAL  Final   Special Requests   Final    BOTTLES DRAWN AEROBIC AND ANAEROBIC Blood Culture adequate volume   Culture   Final    NO GROWTH 3 DAYS Performed at [facility], [address] [place], [place] [address]    Report Status PENDING  Incomplete    Coagulation  Studies: No results for input(s): LABPROT, INR in the last 72 hours.  Urinalysis: No results for input(s): COLORURINE, LABSPEC, PHURINE, GLUCOSEU, HGBUR, BILIRUBINUR, KETONESUR, PROTEINUR, UROBILINOGEN, NITRITE, LEUKOCYTESUR in the last 72 hours.  Invalid input(s): APPERANCEUR    Imaging: No results found.   Medications:   . piperacillin-tazobactam (ZOSYN)  IV 2.25 g (08/09/20 0442)   . sodium chloride   Intravenous Once  . amLODipine  5 mg Oral Daily  . calcium carbonate  2 tablet Oral BID  . Chlorhexidine Gluconate Cloth  6 each Topical Q0600  . clopidogrel  75 mg Oral Daily  . [START ON 08/10/2020] epoetin (EPOGEN/PROCRIT) injection  4,000 Units Intravenous Q T,Th,Sa-HD  . feeding supplement (NEPRO CARB STEADY)  237 mL Oral BID BM  . furosemide  20 mg Intravenous Once  . gabapentin  200 mg Oral BID  . insulin aspart  0-5 Units Subcutaneous QHS  . insulin aspart  0-9 Units Subcutaneous TID WC  . levothyroxine  150 mcg Oral Q0600  . metoprolol tartrate  50 mg Oral BID  . pneumococcal 23 valent vaccine  0.5 mL Intramuscular Tomorrow-1000  . torsemide  100 mg Oral Daily  . vancomycin variable dose per unstable renal function (pharmacist dosing)   Does not apply See admin instructions   diphenoxylate-atropine, loperamide, ondansetron (ZOFRAN) IV, oxyCODONE  Assessment/ Plan:  [name] is a 86 y.o.  female  well known to our practice. Patient was sent in to ED from dialysis clinic for bacteremia. Cultures growing Staph Warneri  [facility] TTS [place] IJ permcath 91.5kg  #ESRD on dialysis TTS  Patient received dialysis treatment Thursday Patient does not have a dialysis access at present as her Rt. IJ access had to be taken out due to bacteremia, and we allowed a catheter free period. Plan to consult vascular team  and place catheter tomorrow We will dialyze her tomorrow once the access is established  #Bacteremia/sepsis No c/o fever or chills Currently  getting treatment with Zosyn and vancomycin  #Secondary Hyperparathyroidism with hypocalemia.  Lab Results  Component Value Date   PTH 163 (H) 06/25/2020   CALCIUM 7.0 (L) 08/09/2020   CAION 1.18 09/06/2012   PHOS 4.3 08/05/2020    No acute indication for Phos binders  # Anemia of CKD Lab Results  Component Value Date   HGB 7.4 (L) 08/07/2020  Received PRBC during this admission Will continue Epogen 4000 units with dialysis    LOS: 5 Rashad Auld 11/8/20211:14 PM

## 2020-08-09 NOTE — Progress Notes (Signed)
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  Results of outpatient blood culture were sent to the inpatient nephrology consultant Dr. Juleen China.  We will attempt to have these uploaded into care everywhere.  Preliminary results from blood cultures growing Staphylococcus warneri.  Concerning resistance pattern but is sensitive to vancomycin which she is on  Regarding her foot wound wound and ostomy has evaluated in place dressing.  I have taken a picture and included in the medical record.  I have requested consultation from Dr. Vickki Muff of podiatry.  Will defer any further imaging after surgical evaluation.  11/4: Consultations obtained with infectious disease and podiatry.  Per podiatry no acute need for surgical management.  Recommending outpatient biopsy to rule out pyoderma gangrenosum and consideration for outpatient wound VAC.  Per ID source may be related to indwelling dialysis catheter.  Recommending dialysis catheter removal.  Communicated with nephrology, plan to remove dialysis catheter after HD today.  No fevers noted over interval.  All cultures on admission remain negative.   11/5: Indwelling catheter removed 08/05/2020.  Patient had some anemia and thrombocytopenia noted post catheter removal.  No bleeding noted.  Catheter site looks clean.  All cultures remain negative to date.  11/6: Hemoglobin stable at 7.4.  No indication for transfusion at this time.  No bleeding noted.  11/8: All cultures negative.  Plan for dialysis catheter replacement tomorrow 08/10/2020   Assessment & Plan:   Principal Problem:   Wound infection Active  Problems:   Hyperlipidemia   Hypertension   GERD (gastroesophageal reflux disease)   S/P CABG x 4   CAD (coronary artery disease)   Type 2 diabetes with nephropathy (HCC)   ESRD on hemodialysis ([place])   History of CVA with residual deficit   Ambulatory dysfunction   Anemia of chronic kidney failure, stage 5 (HCC)   Pressure injury of skin  Dorsal foot wound -Wound culture from 10/15 grew Pseudomonas aeruginosa, Enterococcus faecalis and staph aureus -Etiology of wound is unclear -Infectious disease on consult -Podiatry on consult, no acute surgical management Plan: Continue vancomycin and Zosyn for now Continue wound care Extremity elevation Pain control as needed Outpatient referral to podiatry for skin/ soft tissue biopsy  Staph warneri bacteremia Noted on outpatient blood culture Resistant to oxacillin Currently on vancomycin No fevers over interval Surveillance cultures not demonstrating growth Indwelling catheter removed 08/05/2020 2D echocardiogram with no evidence of vegetation, no indication for TEE Plan: Continue vancomycin for now Follow surveillance cultures, no growth to date Appreciate ID follow-up Plan to replace line on Tuesday, 08/09/2020 Vascular on board    Hypertension -Stable -Continue amlodipine, metoprolol    GERD (gastroesophageal reflux disease) -Chronic and stable.  Continue famotidine    S/P CABG x 4   CAD (coronary artery disease) -Chronic and stable with no complaints of chest pain -Continue aspirin, Plavix, metoprolol.  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  Not  currently on statin per med list -Patient ambulate with a cane    Anemia of chronic kidney  failure, stage 5 (HCC) Thrombocytopenia Worsened after vascular procedure Initial hemoglobin 6.9, repeat hemoglobin 7.3 No bleeding noted Defer transfusion at this time Monitor for any bleeding Check daily CBC Hold chemoprophylaxis     DVT prophylaxis: Subcutaneous heparin Code Status: Full code Family Communication: Husband at bedside 11/7 Disposition Plan: Status is: Inpatient  Remains inpatient appropriate because:IV treatments appropriate due to intensity of illness or inability to take PO and Inpatient level of care appropriate due to severity of illness   Dispo: The patient is from: Home              Anticipated d/c is to: Home              Anticipated d/c date is: 2 days              Patient currently is not medically stable to d/c.   Staphylococcal bacteremia.  Suspected source hemodialysis catheter.  Catheter has been removed.  All cultures negative.  Plan to replace line 08/10/2020.  Consultants:   Nephrology  Infectious disease  Podiatry  Vascular surgery  Procedures:   None  Antimicrobials:   Vancomycin  Zosyn   Subjective: Seen and examined.  Husband at bedside this morning.  Improved pain control right foot.  Objective: Vitals:   08/09/20 0405 08/09/20 0743 08/09/20 0744 08/09/20 1100  BP: (!) 105/55 105/61 105/61 (!) 98/56  Pulse: 82 89 89 71  Resp: 16 16 16 16   Temp: 97.7 F (36.5 C) 97.8 F (36.6 C) 97.8 F (36.6 C) 98.3 F (36.8 C)  TempSrc: Oral Oral Oral Oral  SpO2: 97% 94% 94%   Weight:      Height:        Intake/Output Summary (Last 24 hours) at 08/09/2020 1246 Last data filed at 08/09/2020 0600 Gross per 24 hour  Intake 530 ml  Output --  Net 530 ml   Filed Weights   08/03/20 1554 08/04/20 0602  Weight: 84.4 kg 92.4 kg    Examination:  General exam: Appears calm and comfortable  Respiratory system: Clear to auscultation. Respiratory effort normal. Cardiovascular system: S1 & S2 heard, RRR. No JVD, murmurs, rubs,  gallops or clicks. No pedal edema. Gastrointestinal system: Hyperactive bowel sounds, soft, nondistended, no masses Central nervous system: Alert and oriented. No focal neurological deficits. Extremities: Symmetric 5 x 5 power. Skin:  5 x 7 cm ulcerative wound to dorsal aspect of right midfoot Psychiatry: Judgement and insight appear normal. Mood & affect appropriate.     Data Reviewed: I have personally reviewed following labs and imaging studies  CBC: Recent Labs  Lab 08/03/20 1557 08/03/20 1557 08/04/20 0714 08/05/20 0817 08/06/20 0535 08/06/20 1246 08/07/20 0656  WBC 8.8  --  8.5 6.9 4.5  --  4.7  NEUTROABS  --   --   --  4.8 3.0  --  2.8  HGB 8.6*   < > 9.0* 7.8* 6.9* 7.3* 7.4*  HCT 27.0*  --  28.4* 25.0* 22.4*  --  23.9*  MCV 97.1  --  98.6 97.7 97.8  --  96.4  PLT 163  --  132* 116* 92*  --  92*   < > = values in this interval not displayed.   Basic Metabolic Panel: Recent Labs  Lab 08/05/20 0817 08/06/20 0535 08/07/20 0656 08/08/20 0446 08/09/20 0409  NA 134*  134* 132* 135 136 135  K 4.1  4.1 4.0 3.9 4.3 4.0  CL 99  100 98 97* 99 97*  CO2 24  23 25 27 29 27   GLUCOSE 90  97 109* 98 94 109*  BUN 54*  57* 29* 40* 46* 52*  CREATININE 3.95*  3.89* 3.27* 4.43* 5.03* 5.75*  CALCIUM 6.3*  6.4* 6.7* 7.0* 7.0* 7.0*  PHOS 4.3  --   --   --   --    GFR: Estimated Creatinine Clearance: 8 mL/min (A) (by C-G formula based on SCr of 5.75 mg/dL (H)). Liver Function Tests: Recent Labs  Lab 08/03/20 1557 08/05/20 0817  AST 12*  --   ALT 7  --   ALKPHOS 68  --   BILITOT 0.8  --   PROT 6.5  --   ALBUMIN 2.9* 2.2*   Recent Labs  Lab 08/03/20 1557  LIPASE 34   No results for input(s): AMMONIA in the last 168 hours. Coagulation Profile: No results for input(s): INR, PROTIME in the last 168 hours. Cardiac Enzymes: No results for input(s): CKTOTAL, CKMB, CKMBINDEX, TROPONINI in the last 168 hours. BNP (last 3 results) No results for input(s): PROBNP in  the last 8760 hours. HbA1C: No results for input(s): HGBA1C in the last 72 hours. CBG: Recent Labs  Lab 08/08/20 1936 08/08/20 2326 08/09/20 0402 08/09/20 0741 08/09/20 1138  GLUCAP 140* 136* 95 87 121*   Lipid Profile: No results for input(s): CHOL, HDL, LDLCALC, TRIG, CHOLHDL, LDLDIRECT in the last 72 hours. Thyroid Function Tests: No results for input(s): TSH, T4TOTAL, FREET4, T3FREE, THYROIDAB in the last 72 hours. Anemia Panel: No results for input(s): VITAMINB12, FOLATE, FERRITIN, TIBC, IRON, RETICCTPCT in the last 72 hours. Sepsis Labs: Recent Labs  Lab 08/04/20 0309  PROCALCITON 0.41  LATICACIDVEN 1.2    Recent Results (from the past 240 hour(s))  Culture, blood (routine x 2)     Status: None   Collection Time: 08/04/20  3:09 AM   Specimen: BLOOD  Result Value Ref Range Status   Specimen Description BLOOD BLOOD LEFT ARM  Final   Special Requests   Final    BOTTLES DRAWN AEROBIC AND ANAEROBIC Blood Culture results may not be optimal due to an inadequate volume of blood received in culture bottles   Culture   Final    NO GROWTH 5 DAYS Performed at [facility], [address], [place], [place] [address]    Report Status 08/09/2020 FINAL  Final  Culture, blood (routine x 2)     Status: None   Collection Time: 08/04/20  3:09 AM   Specimen: BLOOD  Result Value Ref Range Status   Specimen Description BLOOD LEFT ANTECUBITAL  Final   Special Requests   Final    BOTTLES DRAWN AEROBIC AND ANAEROBIC Blood Culture results may not be optimal due to an inadequate volume of blood received in culture bottles   Culture   Final    NO GROWTH 5 DAYS Performed at [facility], [address] [place],  [address]    Report Status 08/09/2020 FINAL  Final  Respiratory Panel by RT PCR (Flu A&B, Covid) - Nasopharyngeal Swab     Status: None   Collection Time: 08/04/20  3:09 AM   Specimen: Nasopharyngeal Swab  Result Value Ref Range Status   SARS  Coronavirus 2 by RT PCR NEGATIVE NEGATIVE Final    Comment: (NOTE) SARS-CoV-2 target nucleic acids are NOT DETECTED.  The SARS-CoV-2 RNA is generally detectable in upper  respiratoy specimens during the acute phase of infection. The lowest concentration of SARS-CoV-2 viral copies this assay can detect is 131 copies/mL. A negative result does not preclude SARS-Cov-2 infection and should not be used as the sole basis for treatment or other patient management decisions. A negative result may occur with  improper specimen collection/handling, submission of specimen other than nasopharyngeal swab, presence of viral mutation(s) within the areas targeted by this assay, and inadequate number of viral copies (<131 copies/mL). A negative result must be combined with clinical observations, patient history, and epidemiological information. The expected result is Negative.  Fact Sheet for Patients:  PinkCheek.be  Fact Sheet for Healthcare Providers:  GravelBags.it  This test is no t yet approved or cleared by the [place] FDA and  has been authorized for detection and/or diagnosis of SARS-CoV-2 by FDA under an Emergency Use Authorization (EUA). This EUA will remain  in effect (meaning this test can be used) for the duration of the COVID-19 declaration under Section 564(b)(1) of the Act, 21 U.S.C. section 360bbb-3(b)(1), unless the authorization is terminated or revoked sooner.     Influenza A by PCR NEGATIVE NEGATIVE Final   Influenza B by PCR NEGATIVE NEGATIVE Final    Comment: (NOTE) The Xpert Xpress SARS-CoV-2/FLU/RSV assay is intended as an aid in  the diagnosis of influenza from Nasopharyngeal swab specimens and  should not be used as a sole basis for treatment. Nasal washings and  aspirates are unacceptable for Xpert Xpress SARS-CoV-2/FLU/RSV  testing.  Fact Sheet for  Patients: PinkCheek.be  Fact Sheet for Healthcare Providers: GravelBags.it  This test is not yet approved or cleared by the [place] FDA and  has been authorized for detection and/or diagnosis of SARS-CoV-2 by  FDA under an Emergency Use Authorization (EUA). This EUA will remain  in effect (meaning this test can be used) for the duration of the  Covid-19 declaration under Section 564(b)(1) of the Act, 21  U.S.C. section 360bbb-3(b)(1), unless the authorization is  terminated or revoked. Performed at [facility], [address] [place], [place] [address]   CULTURE, BLOOD (ROUTINE X 2) w Reflex to ID Panel     Status: None (Preliminary result)   Collection Time: 08/06/20 12:17 AM   Specimen: BLOOD  Result Value Ref Range Status   Specimen Description BLOOD BLOOD LEFT HAND  Final   Special Requests   Final    BOTTLES DRAWN AEROBIC AND ANAEROBIC Blood Culture results may not be optimal due to an inadequate volume of blood received in culture bottles   Culture   Final    NO GROWTH 3 DAYS Performed at [facility], [address], [place], [place] [address]    Report Status PENDING  Incomplete  Culture, blood (Routine X 2) w Reflex to ID Panel     Status: None (Preliminary result)   Collection Time: 08/06/20  5:35 AM   Specimen: BLOOD  Result Value Ref Range Status   Specimen Description BLOOD RIGHT ANTECUBITAL  Final   Special Requests   Final    BOTTLES DRAWN AEROBIC AND ANAEROBIC Blood Culture adequate volume   Culture   Final    NO GROWTH 3 DAYS Performed at [facility], [address] [place], [place] [address]    Report Status PENDING  Incomplete         Radiology Studies: No results found.      Scheduled Meds: . sodium chloride   Intravenous Once  . amLODipine  5 mg Oral Daily  . calcium carbonate  2 tablet Oral BID  . Chlorhexidine Gluconate Cloth  6 each Topical  Q0600  . clopidogrel  75 mg Oral Daily  . [START ON 08/10/2020] epoetin (EPOGEN/PROCRIT) injection  4,000 Units Intravenous Q T,Th,Sa-HD  . feeding supplement (NEPRO CARB STEADY)  237 mL Oral BID BM  . furosemide  20 mg Intravenous Once  . gabapentin  200 mg Oral BID  . insulin aspart  0-5 Units Subcutaneous QHS  . insulin aspart  0-9 Units Subcutaneous TID WC  . levothyroxine  150 mcg Oral Q0600  . metoprolol tartrate  50 mg Oral BID  . pneumococcal 23 valent vaccine  0.5 mL Intramuscular Tomorrow-1000  . torsemide  100 mg Oral Daily  . vancomycin variable dose per unstable renal function (pharmacist dosing)   Does not apply See admin instructions   Continuous Infusions: . piperacillin-tazobactam (ZOSYN)  IV 2.25 g (08/09/20 0442)     LOS: 5 days    Time spent: 15 minutes    Sidney Ace, MD [facility] Pager [phone]  If 7PM-7AM, please contact night-coverage 08/09/2020, 12:46 PM

## 2020-08-09 NOTE — Progress Notes (Signed)
Occupational Therapy Treatment Patient Details Name: [name] MRN: [id number] DOB: 09/03/1934 Today's Date: 08/09/2020    History of present illness [name] is an 86 y/o female who was admitted with infected traumatic wound dorsal aspect of R foot followed by wound care clinic. She was sent to the ED for c/o bloodstream infection. Dialysis catheter removed 11/4. 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   OT comments  Pt seen for OT tx, agreeable to session. Pt required Mod-Max A for bed mobility for trunk elevation and BLE mgt but once EOB, able to sit with supervision and stand with CGA and additional time/effort to perform with RW. Pt ambulated to [facility] but was incontinent of urine prior to reaching BSC. Therapist assisted with cleaning up. Pt provided with set up of wipes and able to perform pericare with lateral lean and SBA. Pt progressing towards goals, continues to benefit from skilled OT services to maximize return to PLOF and minimize risk of falls, functional decline, and caregiver burden. Continue to recommend [place] services.    Follow Up Recommendations  Home health OT    Equipment Recommendations  None recommended by OT    Recommendations for Other Services      Precautions / Restrictions Precautions Precautions: Fall Precaution Comments: dorsal R foot wound; BLE bandaged Restrictions Weight Bearing Restrictions: No       Mobility Bed Mobility Overal bed mobility: Needs Assistance Bed Mobility: Supine to Sit     Supine to sit: Mod assist;Max assist        Transfers Overall transfer level: Needs assistance Equipment used: Rolling walker (2 wheeled) Transfers: Sit to/from Stand Sit to Stand: Min guard              Balance Overall balance assessment: Needs  assistance Sitting-balance support: Single extremity supported;Feet supported Sitting balance-Leahy Scale: Good     Standing balance support: Bilateral upper extremity supported Standing balance-Leahy Scale: Fair                             ADL either performed or assessed with clinical judgement   ADL Overall ADL's : Needs assistance/impaired                         Toilet Transfer: Min guard;RW;BSC   Toileting- Clothing Manipulation and Hygiene: Set up;Supervision/safety;Sitting/lateral lean       Functional mobility during ADLs: Min guard;Rolling walker       Vision Baseline Vision/History: Macular Degeneration Patient Visual Report: No change from baseline     Perception     Praxis      Cognition Arousal/Alertness: Awake/alert Behavior During Therapy: WFL for tasks assessed/performed Overall Cognitive Status: Within Functional Limits for tasks assessed                                          Exercises     Shoulder Instructions       General Comments BLE wrapped in coban below the knees to feet    Pertinent Vitals/ Pain       Pain Assessment: No/denies pain  Home Living  Prior Functioning/Environment              Frequency  Min 1X/week        Progress Toward Goals  OT Goals(current goals can now be found in the care plan section)  Progress towards OT goals: Progressing toward goals  Acute Rehab OT Goals Patient Stated Goal: to go home soon OT Goal Formulation: With patient Time For Goal Achievement: 08/20/20 Potential to Achieve Goals: Good  Plan Discharge plan remains appropriate;Frequency remains appropriate    Co-evaluation                 AM-PAC OT "6 Clicks" Daily Activity     Outcome Measure   Help from another person eating meals?: None Help from another person taking care of personal grooming?: A Little Help from  another person toileting, which includes using toliet, bedpan, or urinal?: A Little Help from another person bathing (including washing, rinsing, drying)?: A Little Help from another person to put on and taking off regular upper body clothing?: A Little Help from another person to put on and taking off regular lower body clothing?: A Lot 6 Click Score: 18    End of Session Equipment Utilized During Treatment: Rolling walker  OT Visit Diagnosis: Unsteadiness on feet (R26.81);Low vision, both eyes (H54.2);Muscle weakness (generalized) (M62.81);History of falling (Z91.81);Other abnormalities of gait and mobility (R26.89)   Activity Tolerance Patient tolerated treatment well   Patient Left in chair;with call bell/phone within reach;with chair alarm set;with family/visitor present;Other (comment) (coban wraps in place)   Nurse Communication          Time: 8786-7672 OT Time Calculation (min): 30 min  Charges: OT General Charges $OT Visit: 1 Visit OT Treatments $Self Care/Home Management : 23-37 mins  Jeni Salles, MPH, MS, OTR/L ascom [phone] 08/09/20, 12:12 PM

## 2020-08-10 ENCOUNTER — Encounter: Payer: Self-pay | Admitting: Internal Medicine

## 2020-08-10 ENCOUNTER — Other Ambulatory Visit (INDEPENDENT_AMBULATORY_CARE_PROVIDER_SITE_OTHER): Payer: Self-pay | Admitting: Vascular Surgery

## 2020-08-10 ENCOUNTER — Inpatient Hospital Stay
Admission: EM | Disposition: A | Discharge status: Home / Self Care | Payer: Self-pay | Source: Home / Self Care | Attending: Internal Medicine

## 2020-08-10 DIAGNOSIS — L089 Local infection of the skin and subcutaneous tissue, unspecified: Secondary | ICD-10-CM | POA: Diagnosis not present

## 2020-08-10 DIAGNOSIS — N185 Chronic kidney disease, stage 5: Secondary | ICD-10-CM

## 2020-08-10 DIAGNOSIS — T148XXA Other injury of unspecified body region, initial encounter: Secondary | ICD-10-CM | POA: Diagnosis not present

## 2020-08-10 HISTORY — PX: DIALYSIS/PERMA CATHETER INSERTION: CATH118288

## 2020-08-10 LAB — BASIC METABOLIC PANEL
Anion gap: 11 (ref 5–15)
BUN: 58 mg/dL — ABNORMAL HIGH (ref 8–23)
CO2: 27 mmol/L (ref 22–32)
Calcium: 6.9 mg/dL — ABNORMAL LOW (ref 8.9–10.3)
Chloride: 96 mmol/L — ABNORMAL LOW (ref 98–111)
Creatinine, Ser: 6.31 mg/dL — ABNORMAL HIGH (ref 0.44–1.00)
GFR, Estimated: 6 mL/min — ABNORMAL LOW (ref >60)
Glucose, Bld: 101 mg/dL — ABNORMAL HIGH (ref 70–99)
Potassium: 4.3 mmol/L (ref 3.5–5.1)
Sodium: 134 mmol/L — ABNORMAL LOW (ref 135–145)

## 2020-08-10 LAB — GLUCOSE, CAPILLARY
Glucose-Capillary: 121 mg/dL — ABNORMAL HIGH (ref 70–99)
Glucose-Capillary: 132 mg/dL — ABNORMAL HIGH (ref 70–99)
Glucose-Capillary: 97 mg/dL (ref 70–99)
Glucose-Capillary: 99 mg/dL (ref 70–99)

## 2020-08-10 SURGERY — DIALYSIS/PERMA CATHETER INSERTION
Anesthesia: Choice

## 2020-08-10 MED ORDER — FENTANYL CITRATE (PF) 100 MCG/2ML IJ SOLN
INTRAMUSCULAR | Status: DC | PRN
  Administered 2020-08-10: 50 ug via INTRAVENOUS

## 2020-08-10 MED ORDER — VANCOMYCIN HCL IN DEXTROSE 1-5 GM/200ML-% IV SOLN
1000.0000 mg | INTRAVENOUS | Status: AC
  Administered 2020-08-10: 1000 mg via INTRAVENOUS
  Filled 2020-08-10: qty 200

## 2020-08-10 MED ORDER — SODIUM CHLORIDE 0.9 % IV SOLN: INTRAVENOUS | Status: DC

## 2020-08-10 MED ORDER — FAMOTIDINE 20 MG PO TABS: 40.0000 mg | ORAL_TABLET | Freq: Once | ORAL | Status: DC | PRN

## 2020-08-10 MED ORDER — HEPARIN SODIUM (PORCINE) 10000 UNIT/ML IJ SOLN
INTRAMUSCULAR | Status: AC
  Filled 2020-08-10: qty 1

## 2020-08-10 MED ORDER — MIDAZOLAM HCL 5 MG/5ML IJ SOLN
INTRAMUSCULAR | Status: AC
  Filled 2020-08-10: qty 5

## 2020-08-10 MED ORDER — ONDANSETRON HCL 4 MG/2ML IJ SOLN: 4.0000 mg | Freq: Four times a day (QID) | INTRAMUSCULAR | Status: DC | PRN

## 2020-08-10 MED ORDER — CEFAZOLIN SODIUM-DEXTROSE 1-4 GM/50ML-% IV SOLN
INTRAVENOUS | Status: AC
  Filled 2020-08-10: qty 50

## 2020-08-10 MED ORDER — MIDAZOLAM HCL 2 MG/ML PO SYRP
8.0000 mg | ORAL_SOLUTION | Freq: Once | ORAL | Status: DC | PRN
  Filled 2020-08-10: qty 4

## 2020-08-10 MED ORDER — DIPHENHYDRAMINE HCL 50 MG/ML IJ SOLN: 50.0000 mg | Freq: Once | INTRAMUSCULAR | Status: DC | PRN

## 2020-08-10 MED ORDER — HYDROMORPHONE HCL 1 MG/ML IJ SOLN: 1.0000 mg | Freq: Once | INTRAMUSCULAR | Status: DC | PRN

## 2020-08-10 MED ORDER — MIDAZOLAM HCL 2 MG/2ML IJ SOLN
INTRAMUSCULAR | Status: DC | PRN
  Administered 2020-08-10: 2 mg via INTRAVENOUS

## 2020-08-10 MED ORDER — FENTANYL CITRATE (PF) 100 MCG/2ML IJ SOLN
INTRAMUSCULAR | Status: AC
  Filled 2020-08-10: qty 2

## 2020-08-10 MED ORDER — METHYLPREDNISOLONE SODIUM SUCC 125 MG IJ SOLR: 125.0000 mg | Freq: Once | INTRAMUSCULAR | Status: DC | PRN

## 2020-08-10 MED ORDER — CEFAZOLIN SODIUM-DEXTROSE 1-4 GM/50ML-% IV SOLN
1.0000 g | Freq: Once | INTRAVENOUS | Status: AC
  Administered 2020-08-10: 1 g via INTRAVENOUS
  Filled 2020-08-10: qty 50

## 2020-08-10 SURGICAL SUPPLY — 9 items
CATH PALIN MAXID VT KIT 19CM (CATHETERS) ×1 IMPLANT
DERMABOND ADVANCED (GAUZE/BANDAGES/DRESSINGS) ×1
DERMABOND ADVANCED .7 DNX12 (GAUZE/BANDAGES/DRESSINGS) IMPLANT
NDL ENTRY 21GA 7CM ECHOTIP (NEEDLE) IMPLANT
NEEDLE ENTRY 21GA 7CM ECHOTIP (NEEDLE) ×2 IMPLANT
PACK ANGIOGRAPHY (CUSTOM PROCEDURE TRAY) ×1 IMPLANT
SET INTRO CAPELLA COAXIAL (SET/KITS/TRAYS/PACK) ×1 IMPLANT
SUT MNCRL AB 4-0 PS2 18 (SUTURE) ×1 IMPLANT
SUT SILK 0 FSL (SUTURE) ×1 IMPLANT

## 2020-08-10 NOTE — Care Management Important Message (Signed)
Important Message  Patient Details  Name: [name] MRN: [id number] Date of Birth: Oct 12, 1933   Medicare Important Message Given:  Yes     [name] A Sharine Cadle 08/10/2020, 10:33 AM

## 2020-08-10 NOTE — Interval H&P Note (Signed)
History and Physical Interval Note:  08/10/2020 11:22 AM  [name]  has presented today for surgery, with the diagnosis of ESRD.  The various methods of treatment have been discussed with the patient and family. After consideration of risks, benefits and other options for treatment, the patient has consented to  Procedure(s): DIALYSIS/PERMA CATHETER INSERTION (N/A) as a surgical intervention.  The patient's history has been reviewed, patient examined, no change in status, stable for surgery.  I have reviewed the patient's chart and labs.  Questions were answered to the patient's satisfaction.     Hortencia Pilar

## 2020-08-10 NOTE — Op Note (Signed)
OPERATIVE NOTE   PROCEDURE: 1. Insertion of tunneled dialysis catheter right IJ approach with ultrasound and fluoroscopic guidance.  PRE-OPERATIVE DIAGNOSIS: End-stage renal disease requiring hemodialysis; recent catheter related sepsis  POST-OPERATIVE DIAGNOSIS: Same  SURGEON: Hortencia Pilar.  ANESTHESIA: Conscious sedation was administered under my direct supervision by the interventional radiology RN. IV Versed plus fentanyl were utilized. Continuous ECG, pulse oximetry and blood pressure was monitored throughout the entire procedure. Conscious sedation was for a total of 30 minutes 52 seconds.  ESTIMATED BLOOD LOSS: Minimal cc  CONTRAST USED:  None  FLUOROSCOPY TIME: 0.2 minutes  INDICATIONS:   Jansen T Lineberryis a 86 y.o. y.o. female who presents with improvement status post treatment for catheter related sepsis.  She has not been catheter free and her white count is normal she has been afebrile.  She has been cleared for placement of a tunneled catheter.  DESCRIPTION: After obtaining full informed written consent, the patient was positioned supine. The right neck and chest wall was prepped and draped in a sterile fashion. Ultrasound was placed in a sterile sleeve. Ultrasound was utilized to identify the right internal jugular vein which is noted to be echolucent and compressible indicating patency. Image is recorded for the permanent record. Under direct ultrasound visualization a micro-needle is inserted into the vein followed by the micro-wire. Micro-sheath was then advanced and a J wire is inserted without difficulty under fluoroscopic guidance.   Under fluoroscopy the catheter tip positioned at the atrial caval junction. The catheter is then approximated to the chest wall and an exit site selected. 1% lidocaine is infiltrated in soft tissues at this level small incision is made and the tunneling device is then passed from the exit site to the neck counterincision.  Dilators and  advanced over the J-wire followed by the peel-away sheath.  Catheter is then advanced into the central venous system.  Under fluoroscopy it is adjusted so that the tip is in the proper position and both lumens aspirate and flush easily. After verification of smooth contour with proper tip position under fluoroscopy the catheter is packed with 5000 units of heparin per lumen.  Catheter secured to the skin of the chest wall with 0 silk. A sterile dressing is applied with a Biopatch.  COMPLICATIONS: None  CONDITION: Good  Hortencia Pilar [facility]. Office:  [phone]   08/10/2020,12:29 PM

## 2020-08-10 NOTE — Progress Notes (Signed)
BP of 109/38 sent to Dr. Priscella Mann via secure chat. Patient asymptomatic.

## 2020-08-10 NOTE — Progress Notes (Addendum)
PROGRESS NOTE    [name]  OHY:[id number] DOB: [birth year] DOA: 08/04/2020 PCP: Idelle Crouch, MD   Brief Narrative:  86 year old patient with history significant for ESRD on HD TTS, CAD status post CABG, CVA with residual right lower extremity weakness, type II DM who presented to the emergency department at the request of her nephrologist given concern for gram-positive bacteremia.  Results of outpatient blood culture were sent to the inpatient nephrology consultant Dr. Juleen China.  We will attempt to have these uploaded into care everywhere.  Preliminary results from blood cultures growing Staphylococcus warneri.  Concerning resistance pattern but is sensitive to vancomycin which she is on  Regarding her foot wound wound and ostomy has evaluated in place dressing.  I have taken a picture and included in the medical record.  I have requested consultation from Dr. Vickki Muff of podiatry.  Will defer any further imaging after surgical evaluation.  11/4: Consultations obtained with infectious disease and podiatry.  Per podiatry no acute need for surgical management.  Recommending outpatient biopsy to rule out pyoderma gangrenosum and consideration for outpatient wound VAC.  Per ID source may be related to indwelling dialysis catheter.  Recommending dialysis catheter removal.  Communicated with nephrology, plan to remove dialysis catheter after HD today.  No fevers noted over interval.  All cultures on admission remain negative.   11/5: Indwelling catheter removed 08/05/2020.  Patient had some anemia and thrombocytopenia noted post catheter removal.  No bleeding noted.  Catheter site looks clean.  All cultures remain negative to date.  11/6: Hemoglobin stable at 7.4.  No indication for transfusion at this time.  No bleeding noted.  11/8: All cultures negative.  Plan for dialysis catheter replacement tomorrow 08/10/2020  11/9: Patient seen and examined.  N.p.o. for replacement of indwelling  dialysis line today.   Assessment & Plan:   Principal Problem:   Wound infection Active Problems:   Hyperlipidemia   Hypertension   GERD (gastroesophageal reflux disease)   S/P CABG x 4   CAD (coronary artery disease)   Type 2 diabetes with nephropathy (HCC)   ESRD on hemodialysis (HCC)   History of CVA with residual deficit   Ambulatory dysfunction   Anemia of chronic kidney failure, stage 5 (HCC)   Pressure injury of skin  Dorsal foot wound -Wound culture from 10/15 grew Pseudomonas aeruginosa, Enterococcus faecalis and staph aureus -Etiology of wound is unclear -Infectious disease on consult -Podiatry on consult, no acute surgical management Plan: Continue vancomycin and Zosyn for now Continue wound care Extremity elevation Pain control as needed Outpatient referral to podiatry for skin/ soft tissue biopsy Pending final ID recommendations regarding ongoing antibiotics as outpatient  Staph warneri bacteremia Sepsis ruled OUT Noted on outpatient blood culture Resistant to oxacillin Currently on vancomycin No fevers over interval Surveillance cultures not demonstrating growth Indwelling catheter removed 08/05/2020 2D echocardiogram with no evidence of vegetation, no indication for TEE 08/10/2020: PermCath to be replaced today Plan: Continue vancomycin for now Follow surveillance cultures, no growth to date Appreciate ID follow-up     Hypertension -Stable -Continue amlodipine, metoprolol    GERD (gastroesophageal reflux disease) -Chronic and stable.  Continue famotidine    S/P CABG x 4   CAD (coronary artery disease) -Chronic and stable with no complaints of chest pain -Continue aspirin, Plavix, metoprolol.  Statin not on med list  Hypothyroidism -Continue levothyroxine    Type 2 diabetes with nephropathy ([place]) -Sliding scale insulin    ESRD on hemodialysis [facility]) -Nephrology  consult for continuation of dialysis    History of CVA with residual  right lower extremity weakness   Ambulatory dysfunction -Continue.  Not currently on statin per med list -Patient ambulate with a cane    Anemia of chronic kidney failure, stage 5 (HCC) Thrombocytopenia Worsened after vascular procedure Initial hemoglobin 6.9, repeat hemoglobin 7.3 No bleeding noted Defer transfusion at this time Monitor for any bleeding Check daily CBC Hold chemoprophylaxis     DVT prophylaxis: Subcutaneous heparin Code Status: Full code Family Communication: Husband at bedside 11/9 Disposition Plan: Status is: Inpatient  Remains inpatient appropriate because:Inpatient level of care appropriate due to severity of illness   Dispo: The patient is from: Home              Anticipated d/c is to: Home              Anticipated d/c date is: 1 day              Patient currently is not medically stable to d/c.   Patient having procedure for PermCath replacement today.  Will do hemodialysis in house after line is placed.  Pending formal ID recommendations regarding outpatient antibiotics.   Consultants:   Nephrology  Infectious disease  Podiatry  Vascular surgery  Procedures:   None  Antimicrobials:   Vancomycin  Zosyn   Subjective: Seen and examined.  Husband remains at bedside.  Pain controlled in the right foot.  N.p.o. for line placement today.  Objective: Vitals:   08/10/20 1215 08/10/20 1230 08/10/20 1245 08/10/20 1345  BP: (!) 99/46 (!) 106/40 (!) 113/42   Pulse: 100 86 89   Resp: 18 16 (!) 21   Temp:    (P) 97.6 F (36.4 C)  TempSrc:      SpO2: 90% (!) 89% 97%   Weight:      Height:        Intake/Output Summary (Last 24 hours) at 08/10/2020 1438 Last data filed at 08/09/2020 2312 Gross per 24 hour  Intake 0 ml  Output --  Net 0 ml   Filed Weights   08/03/20 1554 08/04/20 0602  Weight: 84.4 kg 92.4 kg    Examination:  General exam: Appears calm and comfortable  Respiratory system: Clear to auscultation. Respiratory  effort normal. Cardiovascular system: S1 & S2 heard, RRR. No JVD, murmurs, rubs, gallops or clicks. No pedal edema. Gastrointestinal system: Hyperactive bowel sounds, soft, nondistended, no masses Central nervous system: Alert and oriented. No focal neurological deficits. Extremities: Symmetric 5 x 5 power. Skin:  5 x 7 cm ulcerative wound to dorsal aspect of right midfoot Psychiatry: Judgement and insight appear normal. Mood & affect appropriate.     Data Reviewed: I have personally reviewed following labs and imaging studies  CBC: Recent Labs  Lab 08/03/20 1557 08/03/20 1557 08/04/20 0714 08/05/20 0817 08/06/20 0535 08/06/20 1246 08/07/20 0656  WBC 8.8  --  8.5 6.9 4.5  --  4.7  NEUTROABS  --   --   --  4.8 3.0  --  2.8  HGB 8.6*   < > 9.0* 7.8* 6.9* 7.3* 7.4*  HCT 27.0*  --  28.4* 25.0* 22.4*  --  23.9*  MCV 97.1  --  98.6 97.7 97.8  --  96.4  PLT 163  --  132* 116* 92*  --  92*   < > = values in this interval not displayed.   Basic Metabolic Panel: Recent Labs  Lab 08/05/20 0817 08/05/20 [phone]  08/06/20 0535 08/07/20 0656 08/08/20 0446 08/09/20 0409 08/10/20 0418  NA 134*  134*   < > 132* 135 136 135 134*  K 4.1  4.1   < > 4.0 3.9 4.3 4.0 4.3  CL 99  100   < > 98 97* 99 97* 96*  CO2 24  23   < > 25 27 29 27 27   GLUCOSE 90  97   < > 109* 98 94 109* 101*  BUN 54*  57*   < > 29* 40* 46* 52* 58*  CREATININE 3.95*  3.89*   < > 3.27* 4.43* 5.03* 5.75* 6.31*  CALCIUM 6.3*  6.4*   < > 6.7* 7.0* 7.0* 7.0* 6.9*  PHOS 4.3  --   --   --   --   --   --    < > = values in this interval not displayed.   GFR: Estimated Creatinine Clearance: 7.3 mL/min (A) (by C-G formula based on SCr of 6.31 mg/dL (H)). Liver Function Tests: Recent Labs  Lab 08/03/20 1557 08/05/20 0817  AST 12*  --   ALT 7  --   ALKPHOS 68  --   BILITOT 0.8  --   PROT 6.5  --   ALBUMIN 2.9* 2.2*   Recent Labs  Lab 08/03/20 1557  LIPASE 34   No results for input(s): AMMONIA in the last  168 hours. Coagulation Profile: No results for input(s): INR, PROTIME in the last 168 hours. Cardiac Enzymes: No results for input(s): CKTOTAL, CKMB, CKMBINDEX, TROPONINI in the last 168 hours. BNP (last 3 results) No results for input(s): PROBNP in the last 8760 hours. HbA1C: No results for input(s): HGBA1C in the last 72 hours. CBG: Recent Labs  Lab 08/09/20 2005 08/09/20 2148 08/10/20 0001 08/10/20 0346 08/10/20 0724  GLUCAP 119* 129* 121* 97 99   Lipid Profile: No results for input(s): CHOL, HDL, LDLCALC, TRIG, CHOLHDL, LDLDIRECT in the last 72 hours. Thyroid Function Tests: No results for input(s): TSH, T4TOTAL, FREET4, T3FREE, THYROIDAB in the last 72 hours. Anemia Panel: No results for input(s): VITAMINB12, FOLATE, FERRITIN, TIBC, IRON, RETICCTPCT in the last 72 hours. Sepsis Labs: Recent Labs  Lab 08/04/20 0309  PROCALCITON 0.41  LATICACIDVEN 1.2    Recent Results (from the past 240 hour(s))  Culture, blood (routine x 2)     Status: None   Collection Time: 08/04/20  3:09 AM   Specimen: BLOOD  Result Value Ref Range Status   Specimen Description BLOOD BLOOD LEFT ARM  Final   Special Requests   Final    BOTTLES DRAWN AEROBIC AND ANAEROBIC Blood Culture results may not be optimal due to an inadequate volume of blood received in culture bottles   Culture   Final    NO GROWTH 5 DAYS Performed at [facility], [address] [place], [place] [address]    Report Status 08/09/2020 FINAL  Final  Culture, blood (routine x 2)     Status: None   Collection Time: 08/04/20  3:09 AM   Specimen: BLOOD  Result Value Ref Range Status   Specimen Description BLOOD LEFT ANTECUBITAL  Final   Special Requests   Final    BOTTLES DRAWN AEROBIC AND ANAEROBIC Blood Culture results may not be optimal due to an inadequate volume of blood received in culture bottles   Culture   Final    NO GROWTH 5 DAYS Performed at [facility], [address], [place],  [place] [address]  Report Status 08/09/2020 FINAL  Final  Respiratory Panel by RT PCR (Flu A&B, Covid) - Nasopharyngeal Swab     Status: None   Collection Time: 08/04/20  3:09 AM   Specimen: Nasopharyngeal Swab  Result Value Ref Range Status   SARS Coronavirus 2 by RT PCR NEGATIVE NEGATIVE Final    Comment: (NOTE) SARS-CoV-2 target nucleic acids are NOT DETECTED.  The SARS-CoV-2 RNA is generally detectable in upper respiratoy specimens during the acute phase of infection. The lowest concentration of SARS-CoV-2 viral copies this assay can detect is 131 copies/mL. A negative result does not preclude SARS-Cov-2 infection and should not be used as the sole basis for treatment or other patient management decisions. A negative result may occur with  improper specimen collection/handling, submission of specimen other than nasopharyngeal swab, presence of viral mutation(s) within the areas targeted by this assay, and inadequate number of viral copies (<131 copies/mL). A negative result must be combined with clinical observations, patient history, and epidemiological information. The expected result is Negative.  Fact Sheet for Patients:  PinkCheek.be  Fact Sheet for Healthcare Providers:  GravelBags.it  This test is no t yet approved or cleared by the [place] FDA and  has been authorized for detection and/or diagnosis of SARS-CoV-2 by FDA under an Emergency Use Authorization (EUA). This EUA will remain  in effect (meaning this test can be used) for the duration of the COVID-19 declaration under Section 564(b)(1) of the Act, 21 U.S.C. section 360bbb-3(b)(1), unless the authorization is terminated or revoked sooner.     Influenza A by PCR NEGATIVE NEGATIVE Final   Influenza B by PCR NEGATIVE NEGATIVE Final    Comment: (NOTE) The Xpert Xpress SARS-CoV-2/FLU/RSV assay is intended as an aid in  the diagnosis of influenza from  Nasopharyngeal swab specimens and  should not be used as a sole basis for treatment. Nasal washings and  aspirates are unacceptable for Xpert Xpress SARS-CoV-2/FLU/RSV  testing.  Fact Sheet for Patients: PinkCheek.be  Fact Sheet for Healthcare Providers: GravelBags.it  This test is not yet approved or cleared by the [place] FDA and  has been authorized for detection and/or diagnosis of SARS-CoV-2 by  FDA under an Emergency Use Authorization (EUA). This EUA will remain  in effect (meaning this test can be used) for the duration of the  Covid-19 declaration under Section 564(b)(1) of the Act, 21  U.S.C. section 360bbb-3(b)(1), unless the authorization is  terminated or revoked. Performed at [facility], [address] [place], [place] [address]   CULTURE, BLOOD (ROUTINE X 2) w Reflex to ID Panel     Status: None (Preliminary result)   Collection Time: 08/06/20 12:17 AM   Specimen: BLOOD  Result Value Ref Range Status   Specimen Description BLOOD BLOOD LEFT HAND  Final   Special Requests   Final    BOTTLES DRAWN AEROBIC AND ANAEROBIC Blood Culture results may not be optimal due to an inadequate volume of blood received in culture bottles   Culture   Final    NO GROWTH 4 DAYS Performed at [facility], [address], [place], [place] [address]    Report Status PENDING  Incomplete  Culture, blood (Routine X 2) w Reflex to ID Panel     Status: None (Preliminary result)   Collection Time: 08/06/20  5:35 AM   Specimen: BLOOD  Result Value Ref Range Status   Specimen Description BLOOD RIGHT ANTECUBITAL  Final   Special Requests   Final  BOTTLES DRAWN AEROBIC AND ANAEROBIC Blood Culture adequate volume   Culture   Final    NO GROWTH 4 DAYS Performed at [facility], [address] [place],  [address]    Report Status PENDING  Incomplete         Radiology  Studies: PERIPHERAL VASCULAR CATHETERIZATION  Result Date: 08/10/2020 See op note       Scheduled Meds: . sodium chloride   Intravenous Once  . amLODipine  5 mg Oral Daily  . calcium carbonate  2 tablet Oral BID  . Chlorhexidine Gluconate Cloth  6 each Topical Q0600  . clopidogrel  75 mg Oral Daily  . epoetin (EPOGEN/PROCRIT) injection  4,000 Units Intravenous Q T,Th,Sa-HD  . feeding supplement (NEPRO CARB STEADY)  237 mL Oral BID BM  . fentaNYL      . furosemide  20 mg Intravenous Once  . gabapentin  200 mg Oral BID  . heparin      . insulin aspart  0-5 Units Subcutaneous QHS  . insulin aspart  0-9 Units Subcutaneous TID WC  . levothyroxine  150 mcg Oral Q0600  . metoprolol tartrate  50 mg Oral BID  . midazolam      . pneumococcal 23 valent vaccine  0.5 mL Intramuscular Tomorrow-1000  . torsemide  100 mg Oral Daily  . vancomycin variable dose per unstable renal function (pharmacist dosing)   Does not apply See admin instructions   Continuous Infusions: . ceFAZolin    . piperacillin-tazobactam (ZOSYN)  IV 2.25 g (08/10/20 0523)     LOS: 6 days    Time spent: 15 minutes    Sidney Ace, MD [facility] Pager [phone]  If 7PM-7AM, please contact night-coverage 08/10/2020, 2:38 PM

## 2020-08-10 NOTE — Progress Notes (Signed)
Mobility Specialist - Progress Note   08/10/20 1300  Mobility  Activity Off unit  Mobility performed by Mobility specialist    Per chart review, pt currently off unit for HD [facility] Cath placement. Will attempt session as pt becomes available.    Kathee Delton Mobility Specialist 08/10/20, 1:01 PM

## 2020-08-10 NOTE — Progress Notes (Signed)
Pharmacy Antibiotic Note  [name] is a 86 y.o. female admitted on 08/04/2020 with cellulitis to dorsal aspect of right foot followed at the wound care clinic. Pharmacy was consulted for vancomycin and Zosyn dosing. Pt is on HD every T-Th-Sat. She has a h/o cellulitis with wound cultures containing multiple organisms (pseudomonas, enterococcus, MSSA). Outpatient blood culture reported positive for Staph warneri resistant to oxacillin. Potential source could be dialysis catheter which was removed 11/4. Bcx from 11/3 and 11/5 currently NGTD. Patient's last dose of vancomycin was 1000mg  on 11/4  11/7 VT at 0446 21 - unlikely patient will clear much of the vanc, so no additional dose given  Plan: 1) Patient got new port placed 11/9 and is receiving dialysis  Will give vancomcyin 1000mg  IV x1  Follow up 11/10 if patient will need dialysis again or if TTS schedule will be resumed - if TTS will order vancomycin 1000mg  IV with dialysis TTS  Continue variable vancomycin dosing based on levels  Per ID, continue vancomycin during dialysis for 4 weeks total  2) Continue Zosyn 2.25 grams IV every 8 hours   Height: 5\' 4"  (162.6 cm) Weight: 92.4 kg (203 lb 11.3 oz) IBW/kg (Calculated) : 54.7  Temp (24hrs), Avg:98.4 F (36.9 C), Min:97.6 F (36.4 C), Max:99 F (37.2 C)  Recent Labs  Lab 08/03/20 1557 08/03/20 1557 08/04/20 0309 08/04/20 0714 08/04/20 0714 08/05/20 0817 08/05/20 0817 08/06/20 0535 08/07/20 0656 08/08/20 0446 08/09/20 0409 08/10/20 0418  WBC 8.8  --   --  8.5  --  6.9  --  4.5 4.7  --   --   --   CREATININE 2.82*   < >  --  3.33*   < > 3.95*  3.89*   < > 3.27* 4.43* 5.03* 5.75* 6.31*  LATICACIDVEN  --   --  1.2  --   --   --   --   --   --   --   --   --   VANCORANDOM  --   --   --   --   --   --   --   --   --  21  --   --    < > = values in this interval not displayed.    Estimated Creatinine Clearance: 7.3 mL/min (A) (by C-G formula based on SCr of 6.31  mg/dL (H)).    Allergies  Allergen Reactions  . Clonidine Other (See Comments) and Shortness Of Breath    Other reaction(s): Other (see comments) Other reaction(s): Other (See Comments)  . Darvocet [Propoxyphene N-Acetaminophen] Anaphylaxis  . Hydralazine     Other reaction(s): Other (See Comments) glomerulonephritis  . Hydralazine Hcl Other (See Comments)    glomerulonephritis  . Esomeprazole     Other reaction(s): Unknown  . Guaifenesin     Other reaction(s): Unknown Other reaction(s): Unknown Other reaction(s): Unknown Other reaction(s): Unknown  . Phenylephrine-Guaifenesin     Other reaction(s): Unknown  . Rabeprazole     Other reaction(s): Unknown  . Telithromycin     Other reaction(s): Unknown Other reaction(s): Unknown Other reaction(s): Unknown  . Fluocinolone Other (See Comments)    Feels crazy Other reaction(s): Other (see comments) Other reaction(s): Other (See Comments) Feels crazy Feels crazy  . Iodinated Diagnostic Agents Nausea And Vomiting and Other (See Comments)    Burning Other reaction(s): Other (see comments) Other reaction(s): Other (See Comments) Burning Nausea & vomiting  . Levaquin [Levofloxacin] Nausea Only  . Statins  Other (See Comments)    Myalgia Other reaction(s): Other (See Comments), Unknown, Unknown Myalgia    Antimicrobials this admission: ceftazidime x 1 vancomycin 11/3 >>  Zosyn 11/3  >>   Microbiology results: 11/3, 11/5 BCx: NGTD 10/21 Pseudomonas, E faecalis, S aureus 11/3 SARS CoV-2: negative 11/3 influenza A/B: negative    Thank you for allowing pharmacy to be a part of this patient's care.  Sherilyn Banker, PharmD Pharmacy Resident  08/10/2020 2:45 PM

## 2020-08-10 NOTE — Progress Notes (Signed)
PT Cancellation Note  Patient Details Name: [name] MRN: [id number] DOB: 12-08-1933   Cancelled Treatment:    Reason Eval/Treat Not Completed: Patient at procedure or test/unavailable.  Pt currently off unit for procedure.  Will re-attempt PT treatment session at a later date/time.  Leitha Bleak, PT 08/10/20, 1:26 PM

## 2020-08-10 NOTE — Consult Note (Addendum)
WOC follow-up: Pt went to the OR this afternoon; will plan to change right foot/leg dressing tomorrow. Julien Girt MSN, RN, [place], [place], [place]

## 2020-08-10 NOTE — Progress Notes (Signed)
[facility]  ROUNDING NOTE   Subjective:   Patient sleeping in bed, arousable to call. She is NPO for vascular procedure today.   Objective:  Vital signs in last 24 hours:  Temp:  [98.2 F (36.8 C)-99 F (37.2 C)] 98.4 F (36.9 C) (11/09 1100) Pulse Rate:  [80-100] 89 (11/09 1245) Resp:  [16-21] 21 (11/09 1245) BP: (99-130)/(31-58) 113/42 (11/09 1245) SpO2:  [89 %-100 %] 97 % (11/09 1245)  Weight change:  Filed Weights   08/03/20 1554 08/04/20 0602  Weight: 84.4 kg 92.4 kg    Intake/Output: I/O last 3 completed shifts: In: 410 [P.O.:360; IV Piggyback:50] Out: -    Intake/Output this shift:  No intake/output data recorded.  Physical Exam: General: In no acute distress  Head: Normocephalic,Atraumatic  Eyes: Anicteric  Lungs:  Respirations even, symmetrical,Lungs with fine crackles at the bases  Heart: Regular  Abdomen:  Soft, nontender, non distended  Extremities:  1+peripheral edema, lower legs wrapped with dressing  Neurologic: Sleeping, arousable to call  Skin: Bilateral lower legs with dressing  Access: To be established today    Basic Metabolic Panel: Recent Labs  Lab 08/05/20 0817 08/05/20 0817 08/06/20 0535 08/06/20 0535 08/07/20 0656 08/07/20 0656 08/08/20 0446 08/09/20 0409 08/10/20 0418  NA 134*  134*   < > 132*  --  135  --  136 135 134*  K 4.1  4.1   < > 4.0  --  3.9  --  4.3 4.0 4.3  CL 99  100   < > 98  --  97*  --  99 97* 96*  CO2 24  23   < > 25  --  27  --  29 27 27   GLUCOSE 90  97   < > 109*  --  98  --  94 109* 101*  BUN 54*  57*   < > 29*  --  40*  --  46* 52* 58*  CREATININE 3.95*  3.89*   < > 3.27*  --  4.43*  --  5.03* 5.75* 6.31*  CALCIUM 6.3*  6.4*   < > 6.7*   < > 7.0*   < > 7.0* 7.0* 6.9*  PHOS 4.3  --   --   --   --   --   --   --   --    < > = values in this interval not displayed.    Liver Function Tests: Recent Labs  Lab 08/03/20 1557 08/05/20 0817  AST 12*  --   ALT 7  --   ALKPHOS 68  --    BILITOT 0.8  --   PROT 6.5  --   ALBUMIN 2.9* 2.2*   Recent Labs  Lab 08/03/20 1557  LIPASE 34   No results for input(s): AMMONIA in the last 168 hours.  CBC: Recent Labs  Lab 08/03/20 1557 08/03/20 1557 08/04/20 0714 08/05/20 0817 08/06/20 0535 08/06/20 1246 08/07/20 0656  WBC 8.8  --  8.5 6.9 4.5  --  4.7  NEUTROABS  --   --   --  4.8 3.0  --  2.8  HGB 8.6*   < > 9.0* 7.8* 6.9* 7.3* 7.4*  HCT 27.0*  --  28.4* 25.0* 22.4*  --  23.9*  MCV 97.1  --  98.6 97.7 97.8  --  96.4  PLT 163  --  132* 116* 92*  --  92*   < > = values in this interval not displayed.  Cardiac Enzymes: No results for input(s): CKTOTAL, CKMB, CKMBINDEX, TROPONINI in the last 168 hours.  BNP: Invalid input(s): POCBNP  CBG: Recent Labs  Lab 08/09/20 2005 08/09/20 2148 08/10/20 0001 08/10/20 0346 08/10/20 0724  GLUCAP 119* 129* 121* 97 40    Microbiology: Results for orders placed or performed during the hospital encounter of 08/04/20  Culture, blood (routine x 2)     Status: None   Collection Time: 08/04/20  3:09 AM   Specimen: BLOOD  Result Value Ref Range Status   Specimen Description BLOOD BLOOD LEFT ARM  Final   Special Requests   Final    BOTTLES DRAWN AEROBIC AND ANAEROBIC Blood Culture results may not be optimal due to an inadequate volume of blood received in culture bottles   Culture   Final    NO GROWTH 5 DAYS Performed at [facility], [address] [place], [place] [address]    Report Status 08/09/2020 FINAL  Final  Culture, blood (routine x 2)     Status: None   Collection Time: 08/04/20  3:09 AM   Specimen: BLOOD  Result Value Ref Range Status   Specimen Description BLOOD LEFT ANTECUBITAL  Final   Special Requests   Final    BOTTLES DRAWN AEROBIC AND ANAEROBIC Blood Culture results may not be optimal due to an inadequate volume of blood received in culture bottles   Culture   Final    NO GROWTH 5 DAYS Performed at [facility], [address], [place], [place] [address]    Report Status 08/09/2020 FINAL  Final  Respiratory Panel by RT PCR (Flu A&B, Covid) - Nasopharyngeal Swab     Status: None   Collection Time: 08/04/20  3:09 AM   Specimen: Nasopharyngeal Swab  Result Value Ref Range Status   SARS Coronavirus 2 by RT PCR NEGATIVE NEGATIVE Final    Comment: (NOTE) SARS-CoV-2 target nucleic acids are NOT DETECTED.  The SARS-CoV-2 RNA is generally detectable in upper respiratoy specimens during the acute phase of infection. The lowest concentration of SARS-CoV-2 viral copies this assay can detect is 131 copies/mL. A negative result does not preclude SARS-Cov-2 infection and should not be used as the sole basis for treatment or other patient management decisions. A negative result may occur with  improper specimen collection/handling, submission of specimen other than nasopharyngeal swab, presence of viral mutation(s) within the areas targeted by this assay, and inadequate number of viral copies (<131 copies/mL). A negative result must be combined with clinical observations, patient history, and epidemiological information. The expected result is Negative.  Fact Sheet for Patients:  PinkCheek.be  Fact Sheet for Healthcare Providers:  GravelBags.it  This test is no t yet approved or cleared by the [place] FDA and  has been authorized for detection and/or diagnosis of SARS-CoV-2 by FDA under an Emergency Use Authorization (EUA). This EUA will remain  in effect (meaning this test can be used) for the duration of the COVID-19 declaration under Section 564(b)(1) of the Act, 21 U.S.C. section 360bbb-3(b)(1), unless the authorization is terminated or revoked sooner.     Influenza A by PCR NEGATIVE NEGATIVE Final   Influenza B by PCR NEGATIVE NEGATIVE Final    Comment: (NOTE) The Xpert Xpress SARS-CoV-2/FLU/RSV assay is intended as an aid in  the diagnosis  of influenza from Nasopharyngeal swab specimens and  should not be used as a sole basis for treatment. Nasal washings and  aspirates are unacceptable for Xpert Xpress SARS-CoV-2/FLU/RSV  testing.  Fact Sheet for Patients: PinkCheek.be  Fact Sheet for Healthcare Providers: GravelBags.it  This test is not yet approved or cleared by the [place] FDA and  has been authorized for detection and/or diagnosis of SARS-CoV-2 by  FDA under an Emergency Use Authorization (EUA). This EUA will remain  in effect (meaning this test can be used) for the duration of the  Covid-19 declaration under Section 564(b)(1) of the Act, 21  U.S.C. section 360bbb-3(b)(1), unless the authorization is  terminated or revoked. Performed at [facility], [address] [place], [place] [address]   CULTURE, BLOOD (ROUTINE X 2) w Reflex to ID Panel     Status: None (Preliminary result)   Collection Time: 08/06/20 12:17 AM   Specimen: BLOOD  Result Value Ref Range Status   Specimen Description BLOOD BLOOD LEFT HAND  Final   Special Requests   Final    BOTTLES DRAWN AEROBIC AND ANAEROBIC Blood Culture results may not be optimal due to an inadequate volume of blood received in culture bottles   Culture   Final    NO GROWTH 4 DAYS Performed at [facility], [address], [place], [place] [address]    Report Status PENDING  Incomplete  Culture, blood (Routine X 2) w Reflex to ID Panel     Status: None (Preliminary result)   Collection Time: 08/06/20  5:35 AM   Specimen: BLOOD  Result Value Ref Range Status   Specimen Description BLOOD RIGHT ANTECUBITAL  Final   Special Requests   Final    BOTTLES DRAWN AEROBIC AND ANAEROBIC Blood Culture adequate volume   Culture   Final    NO GROWTH 4 DAYS Performed at [facility], [address] [place], [place] [address]    Report Status PENDING  Incomplete    Coagulation  Studies: No results for input(s): LABPROT, INR in the last 72 hours.  Urinalysis: No results for input(s): COLORURINE, LABSPEC, PHURINE, GLUCOSEU, HGBUR, BILIRUBINUR, KETONESUR, PROTEINUR, UROBILINOGEN, NITRITE, LEUKOCYTESUR in the last 72 hours.  Invalid input(s): APPERANCEUR    Imaging: PERIPHERAL VASCULAR CATHETERIZATION  Result Date: 08/10/2020 See op note    Medications:   . sodium chloride 10 mL/hr at 08/10/20 1130  . ceFAZolin    . [MAR Hold] piperacillin-tazobactam (ZOSYN)  IV 2.25 g (08/10/20 0523)   . [MAR Hold] sodium chloride   Intravenous Once  . [MAR Hold] amLODipine  5 mg Oral Daily  . [MAR Hold] calcium carbonate  2 tablet Oral BID  . [MAR Hold] Chlorhexidine Gluconate Cloth  6 each Topical Q0600  . [MAR Hold] clopidogrel  75 mg Oral Daily  . [MAR Hold] epoetin (EPOGEN/PROCRIT) injection  4,000 Units Intravenous Q T,Th,Sa-HD  . [MAR Hold] feeding supplement (NEPRO CARB STEADY)  237 mL Oral BID BM  . fentaNYL      . [MAR Hold] furosemide  20 mg Intravenous Once  . [MAR Hold] gabapentin  200 mg Oral BID  . heparin      . [MAR Hold] insulin aspart  0-5 Units Subcutaneous QHS  . [MAR Hold] insulin aspart  0-9 Units Subcutaneous TID WC  . [MAR Hold] levothyroxine  150 mcg Oral Q0600  . [MAR Hold] metoprolol tartrate  50 mg Oral BID  . midazolam      . pneumococcal 23 valent vaccine  0.5 mL Intramuscular Tomorrow-1000  . [MAR Hold] torsemide  100 mg Oral Daily  . [MAR Hold] vancomycin variable dose per unstable renal function (pharmacist dosing)  Does not apply See admin instructions   diphenhydrAMINE, [MAR Hold] diphenoxylate-atropine, famotidine, [MAR Hold]  HYDROmorphone (DILAUDID) injection, [MAR Hold] loperamide, methylPREDNISolone (SOLU-MEDROL) injection, midazolam, [MAR Hold] ondansetron (ZOFRAN) IV, [MAR Hold] ondansetron (ZOFRAN) IV, [MAR Hold] oxyCODONE  Assessment/ Plan:  [name] is a 86 y.o.  female  well known to our practice. Patient  was sent in to ED from dialysis clinic for bacteremia. Cultures growing Staph Warneri  [facility] TTS [place] IJ permcath 91.5kg  #ESRD on dialysis TTS  Patient NPO for dialysis access placement by vascular team today Plan for dialysis after the access is established Orders placed  #Bacteremia/sepsis Continue antibiotics per infectious disease team recommendations  #Secondary Hyperparathyroidism with hypocalemia.  Lab Results  Component Value Date   PTH 163 (H) 06/25/2020   CALCIUM 6.9 (L) 08/10/2020   CAION 1.18 09/06/2012   PHOS 4.3 08/05/2020    Will continue monitoring bone mineral metabolism parameters  # Anemia of CKD Lab Results  Component Value Date   HGB 7.4 (L) 08/07/2020  Will continue monitoring CBCs Continue Epogen with dialysis TTS    LOS: 6 [name] 11/9/20211:14 PM

## 2020-08-11 ENCOUNTER — Encounter: Payer: Self-pay | Admitting: Internal Medicine

## 2020-08-11 DIAGNOSIS — D62 Acute posthemorrhagic anemia: Secondary | ICD-10-CM

## 2020-08-11 DIAGNOSIS — N186 End stage renal disease: Secondary | ICD-10-CM | POA: Diagnosis not present

## 2020-08-11 DIAGNOSIS — T148XXA Other injury of unspecified body region, initial encounter: Secondary | ICD-10-CM | POA: Diagnosis not present

## 2020-08-11 DIAGNOSIS — L97515 Non-pressure chronic ulcer of other part of right foot with muscle involvement without evidence of necrosis: Secondary | ICD-10-CM

## 2020-08-11 DIAGNOSIS — D696 Thrombocytopenia, unspecified: Secondary | ICD-10-CM

## 2020-08-11 DIAGNOSIS — R7881 Bacteremia: Secondary | ICD-10-CM | POA: Diagnosis not present

## 2020-08-11 DIAGNOSIS — L089 Local infection of the skin and subcutaneous tissue, unspecified: Secondary | ICD-10-CM | POA: Diagnosis not present

## 2020-08-11 LAB — TYPE AND SCREEN
ABO/RH(D): O POS
Antibody Screen: NEGATIVE
Unit division: 0
Unit division: 0

## 2020-08-11 LAB — GLUCOSE, CAPILLARY
Glucose-Capillary: 114 mg/dL — ABNORMAL HIGH (ref 70–99)
Glucose-Capillary: 128 mg/dL — ABNORMAL HIGH (ref 70–99)
Glucose-Capillary: 94 mg/dL (ref 70–99)

## 2020-08-11 LAB — BPAM RBC
Blood Product Expiration Date: 202111142359
Blood Product Expiration Date: 202112052359
Unit Type and Rh: 5100
Unit Type and Rh: 5100

## 2020-08-11 LAB — CULTURE, BLOOD (ROUTINE X 2)
Culture: NO GROWTH
Culture: NO GROWTH
Special Requests: ADEQUATE

## 2020-08-11 MED ORDER — TORSEMIDE 100 MG PO TABS
100.0000 mg | ORAL_TABLET | Freq: Every day | ORAL | 0 refills | Status: DC
Start: 2020-08-12 — End: 2021-02-22

## 2020-08-11 MED ORDER — SODIUM CHLORIDE 0.9 % IV SOLN: 1.0000 g | INTRAVENOUS | Status: DC

## 2020-08-11 MED ORDER — VANCOMYCIN HCL IN DEXTROSE 1-5 GM/200ML-% IV SOLN: 1000.0000 mg | INTRAVENOUS | Status: DC

## 2020-08-11 MED ORDER — SODIUM CHLORIDE 0.9 % IV SOLN
2.0000 g | Freq: Once | INTRAVENOUS | Status: DC
  Filled 2020-08-11: qty 2

## 2020-08-11 MED ORDER — SODIUM CHLORIDE 0.9 % IV SOLN: 1.0000 g | INTRAVENOUS | Status: AC

## 2020-08-11 MED ORDER — VANCOMYCIN HCL IN DEXTROSE 1-5 GM/200ML-% IV SOLN: 1000.0000 mg | INTRAVENOUS | 0 refills | Status: AC

## 2020-08-11 NOTE — Progress Notes (Signed)
ID Pt seen with wound care consultant at 11am this morning Subjective Says she is feeling better No fever No sob Swelling leg better  Objective Patient Vitals for the past 24 hrs:  BP Temp Temp src Pulse Resp SpO2  08/11/20 0739 (!) 113/44 98.3 F (36.8 C) Oral 82 18 92 %  08/11/20 0729 -- -- -- 88 -- 100 %  08/11/20 0507 (!) 100/59 97.9 F (36.6 C) Oral 88 20 92 %  08/10/20 2356 (!) 87/31 98 F (36.7 C) Oral 86 18 95 %  08/10/20 2102 (!) 108/50 98.4 F (36.9 C) Oral 97 20 91 %    O/e awake and alert, no distress Chest cta [id number] Sternal scar abd soft Rt IJ new dialysis cath  rt foot swelling less than beofre- dorsal ulcer present with tendon exposed   CNS - grossly non focal   Labs CBC Latest Ref Rng & Units 08/07/2020 08/06/2020 08/06/2020  WBC 4.0 - 10.5 K/uL 4.7 - 4.5  Hemoglobin 12.0 - 15.0 g/dL 7.4(L) 7.3(L) 6.9(L)  Hematocrit 36 - 46 % 23.9(L) - 22.4(L)  Platelets 150 - 400 K/uL 92(L) - 92(L)    CMP Latest Ref Rng & Units 08/10/2020 08/09/2020 08/08/2020  Glucose 70 - 99 mg/dL 101(H) 109(H) 94  BUN 8 - 23 mg/dL 58(H) 52(H) 46(H)  Creatinine 0.44 - 1.00 mg/dL 6.31(H) 5.75(H) 5.03(H)  Sodium 135 - 145 mmol/L 134(L) 135 136  Potassium 3.5 - 5.1 mmol/L 4.3 4.0 4.3  Chloride 98 - 111 mmol/L 96(L) 97(L) 99  CO2 22 - 32 mmol/L 27 27 29   Calcium 8.9 - 10.3 mg/dL 6.9(L) 7.0(L) 7.0(L)  Total Protein 6.5 - 8.1 g/dL - - -  Total Bilirubin 0.3 - 1.2 mg/dL - - -  Alkaline Phos 38 - 126 U/L - - -  AST 15 - 41 U/L - - -  ALT 0 - 44 U/L - - -   Micro Bc 08/04/20 & 08/06/20 BC neg   Impression/recommendation Staph warneri bacteremia in a patient with ESRD from blood drawn in the dialysis unit  -dialysis catheter removed ,  2 d echonormal valves- no need for TEE Repeatbloodculture 11/3 and 11/5 neg -continue vanco during dialysis for 4 weeks total 09/01/20   Rt foot wound- on dorsum of the foot- unclear etiology- culture done in wound clinic on 10/15 is  pseudomonas, enterococcus, MSSA On  Zosyn  change to ceftazidime on discharge so as to give it during dialysis for a total of 3 more weeks Seen by podiatrist Agree with skin /tissue biopsy as OP to r/o pyoderma gangrenosum Will benefit from palstic evaluation as OP Follow up with Wound clinic as OP ? ?CAD s/p CABG ? _Anemia due to ESRD  Thrombocytopenia- could be from zosyn- evaluate as OP  Discussed the management with patient her husband and hospitalist and nephrologist

## 2020-08-11 NOTE — Progress Notes (Signed)
Pharmacy Antibiotic Note  [name] is a 86 y.o. female admitted on 08/04/2020 with cellulitis to dorsal aspect of right foot followed at the wound care clinic. Pharmacy was consulted for vancomycin and Zosyn dosing. Pt is on HD every T-Th-Sat. She has a h/o cellulitis with wound cultures containing multiple organisms (pseudomonas, enterococcus, MSSA). Outpatient blood culture reported positive for Staph warneri resistant to oxacillin. Potential source could be dialysis catheter which was removed 11/4. Bcx from 11/3 and 11/5 currently NGTD. Patient's last dose of vancomycin was 1000mg  on 11/4  In preparation of discharge, change piperacillin/tazobactam to ceftazidime  11/7 VT at 0446 21 - unlikely patient will clear much of the vanc, so no additional dose given    Plan: 1) Patient got new port placed 11/9 and is receiving dialysis and patient ordered vancomcyin 1000mg  IV x1  Follow up 11/10 if patient will need dialysis again or if TTS schedule will be resumed - if TTS will order vancomycin 1000mg  IV with dialysis TTS  Await nephrology plan  Continue variable vancomycin dosing based on levels  Per ID, continue vancomycin during dialysis for 4 weeks total  2) Ceftazidime 2gm IV x 1 today (when next dose of pip/tazo due) then 1gm IV qHD TTS.     Height: 5\' 4"  (162.6 cm) Weight: 92.4 kg (203 lb 11.3 oz) IBW/kg (Calculated) : 54.7  Temp (24hrs), Avg:98 F (36.7 C), Min:97.6 F (36.4 C), Max:98.4 F (36.9 C)  Recent Labs  Lab 08/05/20 0817 08/05/20 0817 08/06/20 0535 08/07/20 0656 08/08/20 0446 08/09/20 0409 08/10/20 0418  WBC 6.9  --  4.5 4.7  --   --   --   CREATININE 3.95*  3.89*   < > 3.27* 4.43* 5.03* 5.75* 6.31*  VANCORANDOM  --   --   --   --  21  --   --    < > = values in this interval not displayed.    Estimated Creatinine Clearance: 7.3 mL/min (A) (by C-G formula based on SCr of 6.31 mg/dL (H)).    Allergies  Allergen Reactions  . Clonidine Other (See  Comments) and Shortness Of Breath    Other reaction(s): Other (see comments) Other reaction(s): Other (See Comments)  . Darvocet [Propoxyphene N-Acetaminophen] Anaphylaxis  . Hydralazine     Other reaction(s): Other (See Comments) glomerulonephritis  . Hydralazine Hcl Other (See Comments)    glomerulonephritis  . Esomeprazole     Other reaction(s): Unknown  . Guaifenesin     Other reaction(s): Unknown Other reaction(s): Unknown Other reaction(s): Unknown Other reaction(s): Unknown  . Phenylephrine-Guaifenesin     Other reaction(s): Unknown  . Rabeprazole     Other reaction(s): Unknown  . Telithromycin     Other reaction(s): Unknown Other reaction(s): Unknown Other reaction(s): Unknown  . Fluocinolone Other (See Comments)    Feels crazy Other reaction(s): Other (see comments) Other reaction(s): Other (See Comments) Feels crazy Feels crazy  . Iodinated Diagnostic Agents Nausea And Vomiting and Other (See Comments)    Burning Other reaction(s): Other (see comments) Other reaction(s): Other (See Comments) Burning Nausea & vomiting  . Levaquin [Levofloxacin] Nausea Only  . Statins Other (See Comments)    Myalgia Other reaction(s): Other (See Comments), Unknown, Unknown Myalgia    Antimicrobials this admission: ceftazidime x 1 vancomycin 11/3 >>  Zosyn 11/3  >>  11/10 Ceftazidime 11/10 >>  Microbiology results: 11/3, 11/5 BCx: NGTD 10/21 Pseudomonas, E faecalis, S aureus 11/3 SARS CoV-2: negative 11/3 influenza A/B: negative  Thank you for allowing pharmacy to be a part of this patient's care.  Doreene Eland, PharmD, BCPS.   Work Cell: [phone] 08/11/2020 11:52 AM

## 2020-08-11 NOTE — Consult Note (Addendum)
[place] Nurse wound follow up Dr Ramon Dredge, [place] and primary team MD at bedside to assess wound appearance. Dressing changes performed to bliat legs. Wound has greatly reduced in depth since previous measurements. Left leg with decreasing amt edema; no open wounds or weeping.  [employer] logistics/support/administrative officer. Right foot and leg with generalized edema.  Right anterior foot with full thickness wound; 6X4X.5 cm, dark red old blood in wound bed, exposed tendons.  Mod amt dark red drainage on dressing, very painful to touch. Applied dressing as outlined below. Dressing procedure/placement/frequency: Topical treatment orders provided: WOC will plan to change dressing again on Mon: Cleanse bilateral lower legs with soap and water and pat dry. Apply bilat Una boots and coban.  Before applying Una boot to right anterior foot, apply Mepitel contact layer and then Aquacel and 4X4s . Change Una boots and dressings twice a week after discharge.  Pt states she plans to follow-up with the wound patient wound care center after discharge.  Julien Girt MSN, RN, [place], [place], [place]

## 2020-08-11 NOTE — Discharge Summary (Addendum)
Physician Discharge Summary  [name] DOB: 1934/01/20 DOA: 08/04/2020  PCP: Idelle Crouch, MD  Admit date: 08/04/2020 Discharge date: 08/11/2020  Discharge disposition: Home   Recommendations for Outpatient Follow-Up:   Follow-up with PCP 1 week Follow-up with nephrologist for outpatient hemodialysis as scheduled   Discharge Diagnosis:   Principal Problem:   Bacteremia due to Staphylococcus Active Problems:   Hyperlipidemia   Hypertension   GERD (gastroesophageal reflux disease)   S/P CABG x 4   CAD (coronary artery disease)   Type 2 diabetes with nephropathy (HCC)   ESRD on hemodialysis ([place])   Wound infection   History of CVA with residual deficit   Ambulatory dysfunction   Anemia of chronic kidney failure, stage 5 (HCC)   Pressure injury of skin    Discharge Condition: Stable.  Diet recommendation:  Diet Order            Diet Carb Modified           Diet renal 60/70-11-03-1198                   Code Status: Prior     Hospital Course:   [name] is an 86 year old woman with medical history significant for ESRD on HD TTS, CAD status post CABG, CVA with residual right lower extremity weakness, type II DM, infected wound to dorsal aspect of right foot (pain followed by the wound care clinic and had been receiving oral cefdinir and IV vancomycin and ceftazidime at outpatient dialysis since 10/27).  Previous wound cultures obtained on 07/16/2020 shows Pseudomonas, Enterococcus and MSSA.  She presented to the emergency department at the request of her nephrologist given concern for gram-positive bacteremia.  Ultimately bacteria was identified as staph warneri.  She was treated with IV vancomycin and IV Zosyn.  ID specialist was consulted to assist with management.  She was also seen by the nephrologist for ESRD. Vascular surgeon was consulted.  She had a permacath on the right side of her neck that was removed by the  vascular surgeon on 08/05/2020.  A new tunneled dialysis catheter was inserted via right IJ approach on 08/10/2020.  Repeat blood cultures in the hospital were negative.  Her condition has improved and ID recommended 4-week course of IV vancomycin and 3-week course of IV ceftazidime to be given during dialysis in the outpatient setting. Her BP has been running low so amlodipine and hydralazine were discontinued.  She was started on torsemide for ESRD/edema.  Discharge plan was discussed with the patient and her husband at the bedside.  Discharge plan was also discussed with nephrologist, Dr. Candiss Norse and ID specialist, Dr. Steva Ready.     Medical Consultants:    Vascular surgeon, Dr. Delana Meyer  ID specialist, Dr. Steva Ready  Nephrologist, Dr. Candiss Norse   Discharge Exam:    Vitals:   08/10/20 2356 08/11/20 0507 08/11/20 0729 08/11/20 0739  BP: (!) 87/31 (!) 100/59  (!) 113/44  Pulse: 86 88 88 82  Resp: 18 20  18   Temp: 98 F (36.7 C) 97.9 F (36.6 C)  98.3 F (36.8 C)  TempSrc: Oral Oral  Oral  SpO2: 95% 92% 100% 92%  Weight:      Height:         Patient was examined at the bedside with the wound care nurse.   GEN: NAD SKIN: Large open wound on dorsal aspect of right foot with dark red old bloody wound bed and some exposed tendons. Small  scab wound on left lower quadrant EYES: EOMI ENT: MMM CV: RRR PULM: CTA B ABD: soft, obese, NT, +BS CNS: AAO x 3, non focal EXT: Right leg and foot edema. Right foot tenderness.           The results of significant diagnostics from this hospitalization (including imaging, microbiology, ancillary and laboratory) are listed below for reference.     Procedures and Diagnostic Studies:   PERIPHERAL VASCULAR CATHETERIZATION  Result Date: 08/05/2020 See op note  ECHOCARDIOGRAM COMPLETE  Result Date: 08/06/2020    ECHOCARDIOGRAM REPORT   Patient Name:   [name] Date of Exam: 08/05/2020 Medical Rec #:  [id number]          Height:       64.0 in Accession #:    [id number]        Weight:       203.7 lb Date of Birth:  [birth year]         BSA:          1.972 m Patient Age:    86 years          BP:           113/63 mmHg Patient Gender: F                 HR:           97 bpm. Exam Location:  [facility] Procedure: 2D Echo, Color Doppler and Cardiac Doppler Indications:     R78.81 Stroke  History:         Patient has prior history of Echocardiogram examinations, most                  recent 06/23/2020. CAD, Prior CABG, Stroke and CKD; Risk                  Factors:Diabetes, Hypertension and Dyslipidemia.  Sonographer:     Charmayne Sheer RDCS (AE) Referring Phys:  [id number] Sidney Ace Diagnosing Phys: Ida Rogue MD  Sonographer Comments: No subcostal window. IMPRESSIONS  1. Left ventricular ejection fraction, by estimation, is 60 to 65%. The left ventricle has normal function. The left ventricle has no regional wall motion abnormalities. Left ventricular diastolic parameters are consistent with Grade I diastolic dysfunction (impaired relaxation).  2. Right ventricular systolic function is normal. The right ventricular size is normal.  3. Left atrial size was mildly dilated. FINDINGS  Left Ventricle: Left ventricular ejection fraction, by estimation, is 60 to 65%. The left ventricle has normal function. The left ventricle has no regional wall motion abnormalities. The left ventricular internal cavity size was normal in size. There is  no left ventricular hypertrophy. Left ventricular diastolic parameters are consistent with Grade I diastolic dysfunction (impaired relaxation). Right Ventricle: The right ventricular size is normal. No increase in right ventricular wall thickness. Right ventricular systolic function is normal. Left Atrium: Left atrial size was mildly dilated. Right Atrium: Right atrial size was normal in size. Pericardium: There is no evidence of pericardial effusion. Mitral Valve: The mitral valve is normal in structure. No  evidence of mitral valve regurgitation. No evidence of mitral valve stenosis. MV peak gradient, 4.2 mmHg. The mean mitral valve gradient is 2.0 mmHg. Tricuspid Valve: The tricuspid valve is normal in structure. Tricuspid valve regurgitation is not demonstrated. No evidence of tricuspid stenosis. Aortic Valve: The aortic valve is normal in structure. Aortic valve regurgitation is not visualized. Mild to moderate aortic valve sclerosis/calcification is present, without any  evidence of aortic stenosis. Aortic valve mean gradient measures 8.0 mmHg. Aortic valve peak gradient measures 13.7 mmHg. Aortic valve area, by VTI measures 1.63 cm. Pulmonic Valve: The pulmonic valve was normal in structure. Pulmonic valve regurgitation is not visualized. No evidence of pulmonic stenosis. Aorta: The aortic root is normal in size and structure. Venous: The inferior vena cava is normal in size with greater than 50% respiratory variability, suggesting right atrial pressure of 3 mmHg. IAS/Shunts: No atrial level shunt detected by color flow Doppler.  LEFT VENTRICLE PLAX 2D LVIDd:         4.45 cm  Diastology LVIDs:         2.56 cm  LV e' medial:    6.42 cm/s LV PW:         1.03 cm  LV E/e' medial:  11.5 LV IVS:        0.90 cm  LV e' lateral:   6.74 cm/s LVOT diam:     1.90 cm  LV E/e' lateral: 10.9 LV SV:         51 LV SV Index:   26 LVOT Area:     2.84 cm  LEFT ATRIUM             Index LA diam:        3.80 cm 1.93 cm/m LA Vol (A2C):   49.0 ml 24.85 ml/m LA Vol (A4C):   70.9 ml 35.96 ml/m LA Biplane Vol: 59.3 ml 30.08 ml/m  AORTIC VALVE                    PULMONIC VALVE AV Area (Vmax):    1.53 cm     PV Vmax:       1.25 m/s AV Area (Vmean):   1.53 cm     PV Vmean:      81.700 cm/s AV Area (VTI):     1.63 cm     PV VTI:        0.212 m AV Vmax:           185.00 cm/s  PV Peak grad:  6.2 mmHg AV Vmean:          138.000 cm/s PV Mean grad:  3.0 mmHg AV VTI:            0.314 m AV Peak Grad:      13.7 mmHg AV Mean Grad:      8.0 mmHg  LVOT Vmax:         99.80 cm/s LVOT Vmean:        74.500 cm/s LVOT VTI:          0.181 m LVOT/AV VTI ratio: 0.58  AORTA Ao Root diam: 2.90 cm MITRAL VALVE MV Area (PHT): 5.66 cm     SHUNTS MV Peak grad:  4.2 mmHg     Systemic VTI:  0.18 m MV Mean grad:  2.0 mmHg     Systemic Diam: 1.90 cm MV Vmax:       1.02 m/s MV Vmean:      70.7 cm/s MV Decel Time: 134 msec MV E velocity: 73.70 cm/s MV A velocity: 104.00 cm/s MV E/A ratio:  0.71 Ida Rogue MD Electronically signed by Ida Rogue MD Signature Date/Time: 08/06/2020/7:24:42 AM    Final      Labs:   Basic Metabolic Panel: Recent Labs  Lab 08/05/20 1610 08/05/20 9604 08/06/20 0535 08/06/20 0535 08/07/20 5409 08/07/20 8119 08/08/20 1478 08/08/20 2956 08/09/20 0409 08/10/20 2130  NA 134*  134*   < > 132*  --  135  --  136  --  135 134*  K 4.1  4.1   < > 4.0   < > 3.9   < > 4.3   < > 4.0 4.3  CL 99  100   < > 98  --  97*  --  99  --  97* 96*  CO2 24  23   < > 25  --  27  --  29  --  27 27  GLUCOSE 90  97   < > 109*  --  98  --  94  --  109* 101*  BUN 54*  57*   < > 29*  --  40*  --  46*  --  52* 58*  CREATININE 3.95*  3.89*   < > 3.27*  --  4.43*  --  5.03*  --  5.75* 6.31*  CALCIUM 6.3*  6.4*   < > 6.7*  --  7.0*  --  7.0*  --  7.0* 6.9*  PHOS 4.3  --   --   --   --   --   --   --   --   --    < > = values in this interval not displayed.   GFR Estimated Creatinine Clearance: 7.3 mL/min (A) (by C-G formula based on SCr of 6.31 mg/dL (H)). Liver Function Tests: Recent Labs  Lab 08/05/20 0817  ALBUMIN 2.2*   No results for input(s): LIPASE, AMYLASE in the last 168 hours. No results for input(s): AMMONIA in the last 168 hours. Coagulation profile No results for input(s): INR, PROTIME in the last 168 hours.  CBC: Recent Labs  Lab 08/05/20 0817 08/06/20 0535 08/06/20 1246 08/07/20 0656  WBC 6.9 4.5  --  4.7  NEUTROABS 4.8 3.0  --  2.8  HGB 7.8* 6.9* 7.3* 7.4*  HCT 25.0* 22.4*  --  23.9*  MCV 97.7 97.8  --   96.4  PLT 116* 92*  --  92*   Cardiac Enzymes: No results for input(s): CKTOTAL, CKMB, CKMBINDEX, TROPONINI in the last 168 hours. BNP: Invalid input(s): POCBNP CBG: Recent Labs  Lab 08/10/20 0724 08/10/20 1931 08/11/20 0029 08/11/20 0754 08/11/20 1148  GLUCAP 99 132* 114* 94 128*   D-Dimer No results for input(s): DDIMER in the last 72 hours. Hgb A1c No results for input(s): HGBA1C in the last 72 hours. Lipid Profile No results for input(s): CHOL, HDL, LDLCALC, TRIG, CHOLHDL, LDLDIRECT in the last 72 hours. Thyroid function studies No results for input(s): TSH, T4TOTAL, T3FREE, THYROIDAB in the last 72 hours.  Invalid input(s): FREET3 Anemia work up No results for input(s): VITAMINB12, FOLATE, FERRITIN, TIBC, IRON, RETICCTPCT in the last 72 hours. Microbiology Recent Results (from the past 240 hour(s))  Culture, blood (routine x 2)     Status: None   Collection Time: 08/04/20  3:09 AM   Specimen: BLOOD  Result Value Ref Range Status   Specimen Description BLOOD BLOOD LEFT ARM  Final   Special Requests   Final    BOTTLES DRAWN AEROBIC AND ANAEROBIC Blood Culture results may not be optimal due to an inadequate volume of blood received in culture bottles   Culture   Final    NO GROWTH 5 DAYS Performed at [facility], [address], [place], [place] [address]    Report Status 08/09/2020 FINAL  Final  Culture, blood (routine x 2)  Status: None   Collection Time: 08/04/20  3:09 AM   Specimen: BLOOD  Result Value Ref Range Status   Specimen Description BLOOD LEFT ANTECUBITAL  Final   Special Requests   Final    BOTTLES DRAWN AEROBIC AND ANAEROBIC Blood Culture results may not be optimal due to an inadequate volume of blood received in culture bottles   Culture   Final    NO GROWTH 5 DAYS Performed at [facility], [address], [place], [place] [address]    Report Status 08/09/2020 FINAL  Final  Respiratory Panel by RT PCR (Flu A&B, Covid) -  Nasopharyngeal Swab     Status: None   Collection Time: 08/04/20  3:09 AM   Specimen: Nasopharyngeal Swab  Result Value Ref Range Status   SARS Coronavirus 2 by RT PCR NEGATIVE NEGATIVE Final    Comment: (NOTE) SARS-CoV-2 target nucleic acids are NOT DETECTED.  The SARS-CoV-2 RNA is generally detectable in upper respiratoy specimens during the acute phase of infection. The lowest concentration of SARS-CoV-2 viral copies this assay can detect is 131 copies/mL. A negative result does not preclude SARS-Cov-2 infection and should not be used as the sole basis for treatment or other patient management decisions. A negative result may occur with  improper specimen collection/handling, submission of specimen other than nasopharyngeal swab, presence of viral mutation(s) within the areas targeted by this assay, and inadequate number of viral copies (<131 copies/mL). A negative result must be combined with clinical observations, patient history, and epidemiological information. The expected result is Negative.  Fact Sheet for Patients:  PinkCheek.be  Fact Sheet for Healthcare Providers:  GravelBags.it  This test is no t yet approved or cleared by the [place] FDA and  has been authorized for detection and/or diagnosis of SARS-CoV-2 by FDA under an Emergency Use Authorization (EUA). This EUA will remain  in effect (meaning this test can be used) for the duration of the COVID-19 declaration under Section 564(b)(1) of the Act, 21 U.S.C. section 360bbb-3(b)(1), unless the authorization is terminated or revoked sooner.     Influenza A by PCR NEGATIVE NEGATIVE Final   Influenza B by PCR NEGATIVE NEGATIVE Final    Comment: (NOTE) The Xpert Xpress SARS-CoV-2/FLU/RSV assay is intended as an aid in  the diagnosis of influenza from Nasopharyngeal swab specimens and  should not be used as a sole basis for treatment. Nasal washings and   aspirates are unacceptable for Xpert Xpress SARS-CoV-2/FLU/RSV  testing.  Fact Sheet for Patients: PinkCheek.be  Fact Sheet for Healthcare Providers: GravelBags.it  This test is not yet approved or cleared by the [place] FDA and  has been authorized for detection and/or diagnosis of SARS-CoV-2 by  FDA under an Emergency Use Authorization (EUA). This EUA will remain  in effect (meaning this test can be used) for the duration of the  Covid-19 declaration under Section 564(b)(1) of the Act, 21  U.S.C. section 360bbb-3(b)(1), unless the authorization is  terminated or revoked. Performed at [facility], [address] [place], [place] [address]   CULTURE, BLOOD (ROUTINE X 2) w Reflex to ID Panel     Status: None   Collection Time: 08/06/20 12:17 AM   Specimen: BLOOD  Result Value Ref Range Status   Specimen Description BLOOD BLOOD LEFT HAND  Final   Special Requests   Final    BOTTLES DRAWN AEROBIC AND ANAEROBIC Blood Culture results may not be optimal due to an inadequate volume of blood received in  culture bottles   Culture   Final    NO GROWTH 5 DAYS Performed at [facility], [address] [place], [place] [address]    Report Status 08/11/2020 FINAL  Final  Culture, blood (Routine X 2) w Reflex to ID Panel     Status: None   Collection Time: 08/06/20  5:35 AM   Specimen: BLOOD  Result Value Ref Range Status   Specimen Description BLOOD RIGHT ANTECUBITAL  Final   Special Requests   Final    BOTTLES DRAWN AEROBIC AND ANAEROBIC Blood Culture adequate volume   Culture   Final    NO GROWTH 5 DAYS Performed at [facility], [address], [place], [place] [address]    Report Status 08/11/2020 FINAL  Final     Discharge Instructions:   Discharge Instructions    Ambulatory referral to Wound Clinic   Complete by: As directed    Right foot wound   Diet Carb Modified   Complete  by: As directed    Diet renal 60/70-11-03-1198   Complete by: As directed    Discharge wound care:   Complete by: As directed    Change dressing again on Monday. Cleanse bilateral lower legs with soap and water and pat dry. Apply bilateral Unna boots and Coban. Before applying Unna boot to right anterior foot, apply Mepitel contact layer and then Aquascel and 4 x 4's. Change Unna boots and dressings twice a week.   Increase activity slowly   Complete by: As directed      Allergies as of 08/11/2020      Reactions   Clonidine Other (See Comments), Shortness Of Breath   Other reaction(s): Other (see comments) Other reaction(s): Other (See Comments)   Darvocet [propoxyphene N-acetaminophen] Anaphylaxis   Hydralazine    Other reaction(s): Other (See Comments) glomerulonephritis   Hydralazine Hcl Other (See Comments)   glomerulonephritis   Esomeprazole    Other reaction(s): Unknown   Guaifenesin    Other reaction(s): Unknown Other reaction(s): Unknown Other reaction(s): Unknown Other reaction(s): Unknown   Phenylephrine-guaifenesin    Other reaction(s): Unknown   Rabeprazole    Other reaction(s): Unknown   Telithromycin    Other reaction(s): Unknown Other reaction(s): Unknown Other reaction(s): Unknown   Fluocinolone Other (See Comments)   Feels crazy Other reaction(s): Other (see comments) Other reaction(s): Other (See Comments) Feels crazy Feels crazy   Iodinated Diagnostic Agents Nausea And Vomiting, Other (See Comments)   Burning Other reaction(s): Other (see comments) Other reaction(s): Other (See Comments) Burning Nausea & vomiting   Levaquin [levofloxacin] Nausea Only   Statins Other (See Comments)   Myalgia Other reaction(s): Other (See Comments), Unknown, Unknown Myalgia      Medication List    STOP taking these medications   amLODipine 5 MG tablet Commonly known as: NORVASC   famotidine 20 MG tablet Commonly known as: PEPCID   hydrALAZINE 25 MG  tablet Commonly known as: APRESOLINE   promethazine 12.5 MG tablet Commonly known as: PHENERGAN   triamcinolone cream 0.5 % Commonly known as: KENALOG     TAKE these medications   ALPRAZolam 0.5 MG tablet Commonly known as: XANAX Take 0.5 mg by mouth 2 (two) times daily as needed for anxiety.   aspirin 81 MG chewable tablet Chew by mouth.   calcium-vitamin D 500-200 MG-UNIT tablet Commonly known as: OSCAL WITH D Take 1 tablet by mouth daily.   cefTAZidime 1 g in sodium chloride 0.9 % 100 mL Inject 1 g  into the vein Every Tuesday,Thursday,and Saturday with dialysis for 21 days. Start taking on: August 12, 2020   cetirizine 10 MG tablet Commonly known as: ZYRTEC Take 10 mg by mouth daily.   clopidogrel 75 MG tablet Commonly known as: PLAVIX Take 75 mg by mouth in the morning and at bedtime.   Cyanocobalamin 1000 MCG Subl Take 1,000 mcg by mouth daily.   gabapentin 100 MG capsule Commonly known as: NEURONTIN Take 100 mg by mouth 2 (two) times daily.   glimepiride 4 MG tablet Commonly known as: AMARYL Take 4 mg by mouth daily with breakfast.   HYDROcodone-acetaminophen 5-325 MG tablet Commonly known as: NORCO/VICODIN Take 1 tablet by mouth every 6 (six) hours as needed for moderate pain.   levothyroxine 150 MCG tablet Commonly known as: SYNTHROID Take 150 mcg daily before breakfast by mouth.   metoprolol tartrate 50 MG tablet Commonly known as: LOPRESSOR Take 50 mg by mouth in the morning, at noon, and at bedtime.   torsemide 100 MG tablet Commonly known as: DEMADEX Take 1 tablet (100 mg total) by mouth daily. Start taking on: August 12, 2020   vancomycin 1-5 GM/200ML-% Soln Commonly known as: VANCOCIN Inject 200 mLs (1,000 mg total) into the vein Every Tuesday,Thursday,and Saturday with dialysis for 21 days. Start taking on: August 12, 2020            Discharge Care Instructions  (From admission, onward)         Start     Ordered    08/11/20 0000  Discharge wound care:       Comments: Change dressing again on Monday. Cleanse bilateral lower legs with soap and water and pat dry. Apply bilateral Unna boots and Coban. Before applying Unna boot to right anterior foot, apply Mepitel contact layer and then Aquascel and 4 x 4's. Change Unna boots and dressings twice a week.   08/11/20 1231          Follow-up Information    Avva, Ravisankar, MD. Schedule an appointment as soon as possible for a visit in 2 weeks.   Specialty: Internal Medicine Why: left a message for them Contact information: [place] [address] [phone]                Time coordinating discharge: 33 minutes  Signed:  Jennye Boroughs  [facility] 08/11/2020, 9:14 PM   Pager on [email or web address]. If 7PM-7AM, please contact night-coverage at [email or web address]

## 2020-08-11 NOTE — Progress Notes (Signed)
[facility]  ROUNDING NOTE   Subjective:   Patient underwent PermCath placement yesterday.  Did well Denies any acute complaints No shortness of breath Awaiting discharge this morning   Objective:  Vital signs in last 24 hours:  Temp:  [97.9 F (36.6 C)-98.4 F (36.9 C)] 98.3 F (36.8 C) (11/10 0739) Pulse Rate:  [82-97] 82 (11/10 0739) Resp:  [18-20] 18 (11/10 0739) BP: (87-113)/(31-59) 113/44 (11/10 0739) SpO2:  [91 %-100 %] 92 % (11/10 0739)  Weight change:  Filed Weights   08/03/20 1554 08/04/20 0602  Weight: 84.4 kg 92.4 kg    Intake/Output: I/O last 3 completed shifts: In: 677.9 [IV Piggyback:677.9] Out: 1800 [Other:1800]   Intake/Output this shift:  Total I/O In: 240 [P.O.:240] Out: -   Physical Exam: General: In no acute distress  Head: Normocephalic,Atraumatic  Eyes: Anicteric  Lungs:  Respirations even, symmetrical, clear to auscultation  Heart: Regular  Abdomen:  Soft, nontender, non distended  Extremities:  1+peripheral edema, lower legs wrapped with dressing  Neurologic:  Alert and oriented  Skin: Bilateral lower legs with dressing  Access:  Right IJ PermCath    Basic Metabolic Panel: Recent Labs  Lab 08/05/20 0817 08/05/20 0817 08/06/20 0535 08/06/20 0535 08/07/20 0656 08/07/20 0656 08/08/20 0446 08/09/20 0409 08/10/20 0418  NA 134*  134*   < > 132*  --  135  --  136 135 134*  K 4.1  4.1   < > 4.0  --  3.9  --  4.3 4.0 4.3  CL 99  100   < > 98  --  97*  --  99 97* 96*  CO2 24  23   < > 25  --  27  --  29 27 27   GLUCOSE 90  97   < > 109*  --  98  --  94 109* 101*  BUN 54*  57*   < > 29*  --  40*  --  46* 52* 58*  CREATININE 3.95*  3.89*   < > 3.27*  --  4.43*  --  5.03* 5.75* 6.31*  CALCIUM 6.3*  6.4*   < > 6.7*   < > 7.0*   < > 7.0* 7.0* 6.9*  PHOS 4.3  --   --   --   --   --   --   --   --    < > = values in this interval not displayed.    Liver Function Tests: Recent Labs  Lab 08/05/20 0817  ALBUMIN  2.2*   No results for input(s): LIPASE, AMYLASE in the last 168 hours. No results for input(s): AMMONIA in the last 168 hours.  CBC: Recent Labs  Lab 08/05/20 0817 08/06/20 0535 08/06/20 1246 08/07/20 0656  WBC 6.9 4.5  --  4.7  NEUTROABS 4.8 3.0  --  2.8  HGB 7.8* 6.9* 7.3* 7.4*  HCT 25.0* 22.4*  --  23.9*  MCV 97.7 97.8  --  96.4  PLT 116* 92*  --  92*    Cardiac Enzymes: No results for input(s): CKTOTAL, CKMB, CKMBINDEX, TROPONINI in the last 168 hours.  BNP: Invalid input(s): POCBNP  CBG: Recent Labs  Lab 08/10/20 0724 08/10/20 1931 08/11/20 0029 08/11/20 0754 08/11/20 1148  GLUCAP 99 132* 114* 94 128*    Microbiology: Results for orders placed or performed during the hospital encounter of 08/04/20  Culture, blood (routine x 2)     Status: None   Collection Time: 08/04/20  3:09 AM   Specimen: BLOOD  Result Value Ref Range Status   Specimen Description BLOOD BLOOD LEFT ARM  Final   Special Requests   Final    BOTTLES DRAWN AEROBIC AND ANAEROBIC Blood Culture results may not be optimal due to an inadequate volume of blood received in culture bottles   Culture   Final    NO GROWTH 5 DAYS Performed at [facility], [address], [place], [place] [address]    Report Status 08/09/2020 FINAL  Final  Culture, blood (routine x 2)     Status: None   Collection Time: 08/04/20  3:09 AM   Specimen: BLOOD  Result Value Ref Range Status   Specimen Description BLOOD LEFT ANTECUBITAL  Final   Special Requests   Final    BOTTLES DRAWN AEROBIC AND ANAEROBIC Blood Culture results may not be optimal due to an inadequate volume of blood received in culture bottles   Culture   Final    NO GROWTH 5 DAYS Performed at [facility], [address], [place], [place] [address]    Report Status 08/09/2020 FINAL  Final  Respiratory Panel by RT PCR (Flu A&B, Covid) - Nasopharyngeal Swab     Status: None   Collection Time: 08/04/20  3:09 AM   Specimen:  Nasopharyngeal Swab  Result Value Ref Range Status   SARS Coronavirus 2 by RT PCR NEGATIVE NEGATIVE Final    Comment: (NOTE) SARS-CoV-2 target nucleic acids are NOT DETECTED.  The SARS-CoV-2 RNA is generally detectable in upper respiratoy specimens during the acute phase of infection. The lowest concentration of SARS-CoV-2 viral copies this assay can detect is 131 copies/mL. A negative result does not preclude SARS-Cov-2 infection and should not be used as the sole basis for treatment or other patient management decisions. A negative result may occur with  improper specimen collection/handling, submission of specimen other than nasopharyngeal swab, presence of viral mutation(s) within the areas targeted by this assay, and inadequate number of viral copies (<131 copies/mL). A negative result must be combined with clinical observations, patient history, and epidemiological information. The expected result is Negative.  Fact Sheet for Patients:  PinkCheek.be  Fact Sheet for Healthcare Providers:  GravelBags.it  This test is no t yet approved or cleared by the [place] FDA and  has been authorized for detection and/or diagnosis of SARS-CoV-2 by FDA under an Emergency Use Authorization (EUA). This EUA will remain  in effect (meaning this test can be used) for the duration of the COVID-19 declaration under Section 564(b)(1) of the Act, 21 U.S.C. section 360bbb-3(b)(1), unless the authorization is terminated or revoked sooner.     Influenza A by PCR NEGATIVE NEGATIVE Final   Influenza B by PCR NEGATIVE NEGATIVE Final    Comment: (NOTE) The Xpert Xpress SARS-CoV-2/FLU/RSV assay is intended as an aid in  the diagnosis of influenza from Nasopharyngeal swab specimens and  should not be used as a sole basis for treatment. Nasal washings and  aspirates are unacceptable for Xpert Xpress SARS-CoV-2/FLU/RSV  testing.  Fact Sheet  for Patients: PinkCheek.be  Fact Sheet for Healthcare Providers: GravelBags.it  This test is not yet approved or cleared by the [place] FDA and  has been authorized for detection and/or diagnosis of SARS-CoV-2 by  FDA under an Emergency Use Authorization (EUA). This EUA will remain  in effect (meaning this test can be used) for the duration of the  Covid-19 declaration under Section 564(b)(1) of the Act, 21  U.S.C. section 360bbb-3(b)(1), unless the authorization is  terminated or revoked. Performed at [facility], [address] [place], [place] [address]   CULTURE, BLOOD (ROUTINE X 2) w Reflex to ID Panel     Status: None   Collection Time: 08/06/20 12:17 AM   Specimen: BLOOD  Result Value Ref Range Status   Specimen Description BLOOD BLOOD LEFT HAND  Final   Special Requests   Final    BOTTLES DRAWN AEROBIC AND ANAEROBIC Blood Culture results may not be optimal due to an inadequate volume of blood received in culture bottles   Culture   Final    NO GROWTH 5 DAYS Performed at [facility], [address], [place], [place] [address]    Report Status 08/11/2020 FINAL  Final  Culture, blood (Routine X 2) w Reflex to ID Panel     Status: None   Collection Time: 08/06/20  5:35 AM   Specimen: BLOOD  Result Value Ref Range Status   Specimen Description BLOOD RIGHT ANTECUBITAL  Final   Special Requests   Final    BOTTLES DRAWN AEROBIC AND ANAEROBIC Blood Culture adequate volume   Culture   Final    NO GROWTH 5 DAYS Performed at [facility], [address], [place], [place] [address]    Report Status 08/11/2020 FINAL  Final    Coagulation Studies: No results for input(s): LABPROT, INR in the last 72 hours.  Urinalysis: No results for input(s): COLORURINE, LABSPEC, PHURINE, GLUCOSEU, HGBUR, BILIRUBINUR, KETONESUR, PROTEINUR, UROBILINOGEN, NITRITE, LEUKOCYTESUR in the last 72  hours.  Invalid input(s): APPERANCEUR    Imaging: PERIPHERAL VASCULAR CATHETERIZATION  Result Date: 08/10/2020 See op note    Medications:   . [START ON 08/12/2020] cefTAZidime (FORTAZ)  IV    . cefTAZidime (FORTAZ)  IV    . [START ON 08/12/2020] vancomycin     . calcium carbonate  2 tablet Oral BID  . Chlorhexidine Gluconate Cloth  6 each Topical Q0600  . clopidogrel  75 mg Oral Daily  . epoetin (EPOGEN/PROCRIT) injection  4,000 Units Intravenous Q T,Th,Sa-HD  . feeding supplement (NEPRO CARB STEADY)  237 mL Oral BID BM  . furosemide  20 mg Intravenous Once  . gabapentin  200 mg Oral BID  . insulin aspart  0-5 Units Subcutaneous QHS  . insulin aspart  0-9 Units Subcutaneous TID WC  . levothyroxine  150 mcg Oral Q0600  . metoprolol tartrate  50 mg Oral BID  . pneumococcal 23 valent vaccine  0.5 mL Intramuscular Tomorrow-1000  . torsemide  100 mg Oral Daily  . vancomycin variable dose per unstable renal function (pharmacist dosing)   Does not apply See admin instructions   HYDROmorphone (DILAUDID) injection, loperamide, ondansetron (ZOFRAN) IV, oxyCODONE  Assessment/ Plan:  [name] is a 86 y.o.  female  well known to our practice. Patient was sent in to ED from dialysis clinic for bacteremia. Cultures growing Staph Warneri  [facility] TTS [place] IJ permcath 91.5kg  #ESRD on dialysis TTS  PermCath has been placed Continue outpatient dialysis TTS schedule Low BP Hydralazine and amlodipine on hold  #Bacteremia/sepsis Continue antibiotics per infectious disease team recommendations Wound care as per recommendation of [place] nurse Antibiotics: Vancomycin 1000 mg IV with dialysis in addition to ceftazidime 2 g IV with dialysis for another 3 weeks   #Secondary Hyperparathyroidism with hypocalemia.  Lab Results  Component Value Date   PTH 163 (H) 06/25/2020   CALCIUM 6.9 (L)  08/10/2020   CAION 1.18 09/06/2012   PHOS 4.3 08/05/2020    Will continue  monitoring bone mineral metabolism parameters   # Anemia of CKD Lab Results  Component Value Date   HGB 7.4 (L) 08/07/2020  Will continue monitoring CBCs Continue Epogen with dialysis TTS Received blood transfusion this admission   LOS: 7 Phala Schraeder 11/10/20211:56 PM

## 2020-08-11 NOTE — Progress Notes (Signed)
Secure chat to MD regarding BP of 113/44 this am.

## 2020-08-18 ENCOUNTER — Ambulatory Visit: Payer: Medicare Other | Admitting: Internal Medicine

## 2020-08-19 NOTE — Progress Notes (Signed)
Cardiology Office Note  Date:  08/20/2020   ID:  LAFAYE MCELMURRY, DOB 02/14/1934, MRN [id number]  PCP:  Idelle Crouch, MD   Chief Complaint  Patient presents with  . office visit    6 week F/U; Meds verbally reviewed with patient.    HPI:  [name] is an 86 yo woman with  coronary artery disease,  cardiac catheterization at [facility] 08/22/2012 showing severe distal left main, ostial LAD and circumflex disease also with RCA disease  CABG x4, LIMA to LAD, SVG to Diag, SVG to OM1, SVG to PDA, EVH from bilateral thighs postsurgical stroke with right-sided deficits Chronic renal insufficiency, hydronephrosis ESRD on HD 3x a week anemia  who presents for followup today of her coronary artery disease, renal failure  In hospital 08/11/2020 infected wound to dorsal aspect of right foot   outpatient dialysis since 07/28/20  wound cultures 07/16/2020 show Pseudomonas, Enterococcus and MSSA.   staph warneri.  treated with IV vancomycin and IV Zosyn.    permacath on the right side of her neck that was removed by the vascular surgeon on 08/05/2020.   new tunneled dialysis catheter was inserted via right IJ approach on 08/10/2020.   4-week course of IV vancomycin and 3-week course of IV ceftazidime to be given during dialysis in the outpatient setting. amlodipine and hydralazine were discontinued secondary to hypotension  Reports that she is concerned about a sacral decub ulcer, As she is sitting for such long periods of time for dialysis She is sedentary, relatively immobile  Chronic leg swelling right >left swelling, wrap in place  Dramatic weight loss over the past 6 months down 25 to 30 pounds over the past year Sugars dropping past several days Waking up in the morning markedly low sugars and confusion Stopped Amaryl   Periodic low pressure after HD  Macular degeneration Severe knee pain, does not like knee brace  In the past has declined statin  Recent EKG from 2  weeks ago reviewed  Other past medical history reviewed   previously seen Dr. Lucky Cowboy, for venous insufficiency   cardiac catheterization report 08/22/2012 showed distal left main 70% disease, ostial LAD of 80%, proximal LAD 70%, distal LAD diffuse 70%, diagonal #150% followed by discrete 95% lesion, ostial circumflex 90% disease, proximal circumflex 70% disease, proximal RCA 50%, distal RCA 60% PDA branch 60%  PMH:   has a past medical history of Angina pectoris ([place]) (08/26/2012), Arthritis, Breast cancer ([place]) (2012), Breast mass, right, Cancer ([place]), Chronic kidney disease, Complication of anesthesia, Coronary artery disease (08/22/2012), Diabetes ([place]), Dyspnea, Gastritis, GERD (gastroesophageal reflux disease), Headache(784.0), History of breast cancer, History of seasonal allergies, Hyperlipemia, Hypertension, Hypothyroidism, Kidney disease, chronic, stage IV (GFR 15-29 ml/min) ([place]), Macular degeneration, Macular degeneration (2018), Neuropathy, Personal history of radiation therapy, Pneumonia, PONV (postoperative nausea and vomiting), S/P CABG x 4 (09/05/2012), Stroke [facility]), Thyroid disease, and Vertigo.  PSH:    Past Surgical History:  Procedure Laterality Date  . ABDOMINAL HYSTERECTOMY    . APPENDECTOMY    . BACK SURGERY    . BREAST BIOPSY Right    2012 positive- [facility]  . BREAST LUMPECTOMY Right 2012   F/U radiation   . BREAST SURGERY    . CARDIAC CATHETERIZATION  2014  . CAROTID PTA/STENT INTERVENTION Left 06/14/2020   Procedure: CAROTID PTA/STENT INTERVENTION;  Surgeon: Algernon Huxley, MD;  Location: [place] [facility];  Service: Cardiovascular;  Laterality: Left;  . CATARACT EXTRACTION W/PHACO Right 08/14/2017   Procedure:  CATARACT EXTRACTION PHACO AND INTRAOCULAR LENS PLACEMENT (IOC);  Surgeon: Birder Robson, MD;  Location: [facility];  Service: Ophthalmology;  Laterality: Right;  [place] 00:43.0 AP% 17.1 CDE 7.37 Fluid Pack lot # [id number]  . CATARACT EXTRACTION W/PHACO Left 10/09/2017    Procedure: CATARACT EXTRACTION PHACO AND INTRAOCULAR LENS PLACEMENT ([place]);  Surgeon: Birder Robson, MD;  Location: [facility];  Service: Ophthalmology;  Laterality: Left;  [place] 00:30.3 AP% 11.0 CDE 3.33 Fluid Pack Lot # [id number]  . CORONARY ARTERY BYPASS GRAFT  09/05/2012   Procedure: CORONARY ARTERY BYPASS GRAFTING (CABG);  Surgeon: Rexene Alberts, MD;  Location: [place];  Service: Open Heart Surgery;  Laterality: N/A;  CABG x four, using left internal mammary artery and bilateral greater saphenous vein harvested endoscopically  . DIALYSIS/PERMA CATHETER INSERTION Left 06/25/2020   Procedure: DIALYSIS/PERMA CATHETER INSERTION;  Surgeon: Algernon Huxley, MD;  Location: [place] [facility];  Service: Cardiovascular;  Laterality: Left;  . DIALYSIS/PERMA CATHETER INSERTION N/A 08/10/2020   Procedure: DIALYSIS/PERMA CATHETER INSERTION;  Surgeon: Katha Cabal, MD;  Location: [place] [facility];  Service: Cardiovascular;  Laterality: N/A;  . DIALYSIS/PERMA CATHETER REMOVAL N/A 08/05/2020   Procedure: DIALYSIS/PERMA CATHETER REMOVAL;  Surgeon: Algernon Huxley, MD;  Location: [place] [facility];  Service: Cardiovascular;  Laterality: N/A;  . INTRAOPERATIVE TRANSESOPHAGEAL ECHOCARDIOGRAM  09/05/2012   Procedure: INTRAOPERATIVE TRANSESOPHAGEAL ECHOCARDIOGRAM;  Surgeon: Rexene Alberts, MD;  Location: [place];  Service: Open Heart Surgery;  Laterality: N/A;  . KNEE SURGERY     Bilateral nerve block  . LAPAROSCOPIC NISSEN FUNDOPLICATION    . OVARIAN CYST REMOVAL      Current Outpatient Medications  Medication Sig Dispense Refill  . ALPRAZolam (XANAX) 0.5 MG tablet Take 0.5 mg by mouth 2 (two) times daily as needed for anxiety.     Marland Kitchen aspirin 81 MG chewable tablet Chew 81 mg by mouth every Monday, Wednesday, and Friday.     . calcium-vitamin D (OSCAL WITH D) 500-200 MG-UNIT tablet Take 1 tablet by mouth daily.    . cefTAZidime 1 g in sodium chloride 0.9 % 100 mL Inject 1 g into the vein Every  Tuesday,Thursday,and Saturday with dialysis for 21 days.    . cetirizine (ZYRTEC) 10 MG tablet Take 10 mg by mouth daily.    . clopidogrel (PLAVIX) 75 MG tablet Take 75 mg by mouth in the morning and at bedtime.     . Cyanocobalamin 1000 MCG SUBL Take 1,000 mcg by mouth daily.     Marland Kitchen gabapentin (NEURONTIN) 100 MG capsule Take 100 mg by mouth 2 (two) times daily.    Marland Kitchen HYDROcodone-acetaminophen (NORCO/VICODIN) 5-325 MG tablet Take 1 tablet by mouth every 6 (six) hours as needed for moderate pain.    Marland Kitchen levothyroxine (SYNTHROID, LEVOTHROID) 150 MCG tablet Take 150 mcg daily before breakfast by mouth.    . metoprolol tartrate (LOPRESSOR) 50 MG tablet Take 50 mg by mouth in the morning, at noon, and at bedtime.    . torsemide (DEMADEX) 100 MG tablet Take 1 tablet (100 mg total) by mouth daily. 30 tablet 0  . vancomycin (VANCOCIN) 1-5 GM/200ML-% SOLN Inject 200 mLs (1,000 mg total) into the vein Every Tuesday,Thursday,and Saturday with dialysis for 21 days. 1800 mL 0  . glimepiride (AMARYL) 4 MG tablet Take 4 mg by mouth daily with breakfast.  (Patient not taking: Reported on 08/20/2020)     No current facility-administered medications for this visit.     Allergies:  Clonidine, Darvocet [propoxyphene n-acetaminophen], Hydralazine, Hydralazine hcl, Esomeprazole, Guaifenesin, Phenylephrine-guaifenesin, Rabeprazole, Telithromycin, Fluocinolone, Iodinated diagnostic agents, Levaquin [levofloxacin], and Statins   Social History:  The patient  reports that she quit smoking about 31 years ago. Her smoking use included cigarettes. She has never used smokeless tobacco. She reports that she does not drink alcohol and does not use drugs.   Family History:   family history includes Breast cancer in her cousin; Cancer in her father; Heart disease in her brother and mother; Hyperlipidemia in her brother; Hypertension in her brother.    Review of Systems: Review of Systems  Constitutional: Negative.   Eyes:  Positive for blurred vision.  Respiratory: Negative.   Cardiovascular: Negative.   Gastrointestinal: Negative.   Musculoskeletal: Positive for joint pain.       Gait instability  Neurological: Negative.   Psychiatric/Behavioral: Negative.   All other systems reviewed and are negative.   PHYSICAL EXAM: VS:  BP 126/68 (BP Location: Left Arm, Patient Position: Sitting, Cuff Size: Normal)   Pulse 86   Ht 5\' 4"  (1.626 m)   Wt 186 lb (84.4 kg)   SpO2 92%   BMI 31.93 kg/m  , BMI Body mass index is 31.93 kg/m. Constitutional:  oriented to person, place, and time. No distress.  HENT:  Head: Grossly normal Eyes:  no discharge. No scleral icterus.  Neck: No JVD, no carotid bruits  Cardiovascular: Regular rate and rhythm, no murmurs appreciated Pulmonary/Chest: Clear to auscultation bilaterally, no wheezes or rails Abdominal: Soft.  no distension.  no tenderness.  Musculoskeletal: Normal range of motion Neurological:  normal muscle tone. Coordination normal. No atrophy Skin: Skin warm and dry Psychiatric: normal affect, pleasant  Recent Labs: 06/23/2020: B Natriuretic Peptide 488.2; TSH 3.275 08/03/2020: ALT 7 08/07/2020: Hemoglobin 7.4; Platelets 92 08/10/2020: BUN 58; Creatinine, Ser 6.31; Potassium 4.3; Sodium 134    Lipid Panel Lab Results  Component Value Date   CHOL 154 09/09/2012   HDL 31 (L) 09/09/2012   LDLCALC 92 09/09/2012   TRIG 157 (H) 09/09/2012      Wt Readings from Last 3 Encounters:  08/20/20 186 lb (84.4 kg)  08/04/20 203 lb 11.3 oz (92.4 kg)  07/06/20 205 lb (93 kg)       ASSESSMENT AND PLAN:  Essential hypertension -  Blood pressure is well controlled on today's visit. No changes made to the medications.  Coronary artery disease involving coronary bypass graft of native heart with angina pectoris ([place]) - Currently with no symptoms of angina. No further workup at this time. Continue current medication regimen.  Mixed hyperlipidemia  declined  cholesterol medication Numbers continue to fall with weight loss  S/P CABG x 4  No angina, sedentary No further testing  End-stage renal disease on hemodialysis Still getting adjusted to dialysis  Anemia, unspecified type  Followed by nephrology  Weakness Had nerve blocks to her knees bilaterally  Very sedentary, profound leg weakness   Total encounter time more than 25 minutes  Greater than 50% was spent in counseling and coordination of care with the patient     No orders of the defined types were placed in this encounter.    Signed, Esmond Plants, M.D., Ph.D. 08/20/2020  [place], [place]

## 2020-08-20 ENCOUNTER — Other Ambulatory Visit: Payer: Self-pay

## 2020-08-20 ENCOUNTER — Ambulatory Visit (INDEPENDENT_AMBULATORY_CARE_PROVIDER_SITE_OTHER): Payer: Medicare Other | Admitting: Cardiovascular Disease

## 2020-08-20 ENCOUNTER — Encounter: Payer: Self-pay | Admitting: Cardiovascular Disease

## 2020-08-20 VITALS — BP 126/68 | HR 86 | Ht 64.0 in | Wt 186.0 lb

## 2020-08-20 DIAGNOSIS — I6522 Occlusion and stenosis of left carotid artery: Secondary | ICD-10-CM

## 2020-08-20 DIAGNOSIS — Z951 Presence of aortocoronary bypass graft: Secondary | ICD-10-CM

## 2020-08-20 DIAGNOSIS — E782 Mixed hyperlipidemia: Secondary | ICD-10-CM

## 2020-08-20 DIAGNOSIS — I25708 Atherosclerosis of coronary artery bypass graft(s), unspecified, with other forms of angina pectoris: Secondary | ICD-10-CM | POA: Diagnosis not present

## 2020-08-20 DIAGNOSIS — E1159 Type 2 diabetes mellitus with other circulatory complications: Secondary | ICD-10-CM | POA: Diagnosis not present

## 2020-08-20 DIAGNOSIS — I10 Essential (primary) hypertension: Secondary | ICD-10-CM

## 2020-08-20 NOTE — Patient Instructions (Addendum)
Medication Instructions:  No changes  If you need a refill on your cardiac medications before your next appointment, please call your pharmacy.    Lab work: No new labs needed   If you have labs (blood work) drawn today and your tests are completely normal, you will receive your results only by: . MyChart Message (if you have MyChart) OR . A paper copy in the mail If you have any lab test that is abnormal or we need to change your treatment, we will call you to review the results.   Testing/Procedures: No new testing needed   Follow-Up: At [facility], you and your health needs are our priority.  As part of our continuing mission to provide you with exceptional heart care, we have created designated Provider Care Teams.  These Care Teams include your primary Cardiologist (physician) and Advanced Practice Providers (APPs -  Physician Assistants and Nurse Practitioners) who all work together to provide you with the care you need, when you need it.  . You will need a follow up appointment in 6 months  . Providers on your designated Care Team:   . Christopher Berge, NP . Ryan Dunn, PA-C . Jacquelyn Visser, PA-C  Any Other Special Instructions Will Be Listed Below (If Applicable).  COVID-19 Vaccine Information can be found at: [email or web address] For questions related to vaccine distribution or appointments, please email [email or web address] or call [phone].     

## 2020-09-01 ENCOUNTER — Encounter: Payer: Medicare Other | Attending: Internal Medicine | Admitting: Internal Medicine

## 2020-09-01 ENCOUNTER — Other Ambulatory Visit: Payer: Self-pay

## 2020-09-01 DIAGNOSIS — L97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 with necrosis of muscle: Secondary | ICD-10-CM | POA: Diagnosis not present

## 2020-09-01 DIAGNOSIS — E11621 Type 2 diabetes mellitus with foot ulcer: Secondary | ICD-10-CM | POA: Diagnosis present

## 2020-09-02 ENCOUNTER — Other Ambulatory Visit (INDEPENDENT_AMBULATORY_CARE_PROVIDER_SITE_OTHER): Payer: Self-pay | Admitting: Vascular Surgery

## 2020-09-02 DIAGNOSIS — N186 End stage renal disease: Secondary | ICD-10-CM

## 2020-09-03 ENCOUNTER — Ambulatory Visit (INDEPENDENT_AMBULATORY_CARE_PROVIDER_SITE_OTHER): Payer: Medicare Other

## 2020-09-03 ENCOUNTER — Other Ambulatory Visit: Payer: Self-pay

## 2020-09-03 ENCOUNTER — Ambulatory Visit (INDEPENDENT_AMBULATORY_CARE_PROVIDER_SITE_OTHER): Payer: Medicare Other | Admitting: Vascular Surgery

## 2020-09-03 ENCOUNTER — Encounter (INDEPENDENT_AMBULATORY_CARE_PROVIDER_SITE_OTHER): Payer: Self-pay | Admitting: Vascular Surgery

## 2020-09-03 VITALS — BP 122/69 | HR 83 | Resp 16

## 2020-09-03 DIAGNOSIS — I10 Essential (primary) hypertension: Secondary | ICD-10-CM

## 2020-09-03 DIAGNOSIS — Z992 Dependence on renal dialysis: Secondary | ICD-10-CM

## 2020-09-03 DIAGNOSIS — N186 End stage renal disease: Secondary | ICD-10-CM | POA: Diagnosis not present

## 2020-09-03 DIAGNOSIS — I6523 Occlusion and stenosis of bilateral carotid arteries: Secondary | ICD-10-CM

## 2020-09-03 DIAGNOSIS — E1159 Type 2 diabetes mellitus with other circulatory complications: Secondary | ICD-10-CM | POA: Diagnosis not present

## 2020-09-03 NOTE — Assessment & Plan Note (Signed)
Vein mapping today shows adequate cephalic vein at the antecubital fossa bilaterally.  It is a little larger on the right than the left, but she is right-hand dominant.  The basilic veins were also adequate bilaterally.  Triphasic arterial flow was seen in both upper extremities. I had a discussion with she and her husband today.  We could perform brachiocephalic AV fistulas in either upper extremity I believe.  The vein is bigger on the right, but she does not want it on the right as she has had had breast cancer surgery on the right side and she is right-hand dominant.  That is certainly reasonable.  We discussed that if the left arm fistula fails to mature her or eventually fails, we may need to the right arm.  She is okay with this.

## 2020-09-03 NOTE — Progress Notes (Signed)
SINCLAIR, ARRAZOLA ([id number]) Visit Report for 09/01/2020 HPI Details [name], [name] Date of Service: 09/01/2020 10:00 AM Medical Record Number: [id number] Patient Account Number: [id number] Date of Birth/Sex: 06/10/1934 (86 y.o. F) Treating RN: Cornell Barman Primary Care Provider: Fulton Reek Other Clinician: Referring Provider: Fulton Reek Treating Provider/Extender: Tito Dine in Treatment: 7 History of Present Illness HPI Description: ADMISSION 07/14/2020. This is a medically complex 86 year old woman who is sent over urgently from her primary care doctor Dr. Doy Hutching at [facility] and we worked her in this afternoon. She is apparently developed a blister on the dorsal foot sometime over the last 2 weeks on the right which seems to have developed a hemorrhagic component to this. The history here is somewhat vague but this is happened since she left the hospital on 07/02/2020. She also has a blistered area on the left dorsal foot although this does not have a hemorrhagic component to it and it is not threatened to open where is the right side is definitely leaking fluid and could easily break down. I do not believe she has been doing anything specific to this. With regards to her medical history I have reviewed the discharge summary from the hospital from 06/22/2020 through 07/02/2020. 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 She was given prednisone but was not felt to be a candidate for immunosuppression secondary to her age and comorbidities. She is ANA positive p-ANCA positive. She had a right IJ catheter placed on 9/24 and is being done on dialysis. She is a diabetic. She takes prednisone 20 mg twice daily. She was on aspirin and Plavix although I think these have been put on hold. Past medical history includes; pauci-immune crescentic glomerulonephritis.  History of peripheral edema and type 2 diabetes left carotid artery stenosis with a history of a CVA, coronary artery disease and hypertension I cannot see any arterial evaluation in her record in [facility] link. We could not do arterial ABIs on her because of complaints of discomfort 10/20; 1 week follow-up. This is a patient who is a diabetic. She recently developed acute renal failure because of pauci-immune glomerulonephritis. Sometime during her initial hospitalization she developed a wound on her right foot tremendous swelling in both legs. She was sent over here urgently last week by her primary care physician with what looks to be hematoma still contained on the right dorsal foot. We put silver alginate on this and put her in compression to control some of the swelling. The left leg had a similar appearance with severe edema in the left dorsal foot so we put that under compression as well. When she arrived back for nurse follow-up on Friday there was some drainage coming out of the wound which our nurse cultured. This is come back showing Pseudomonas Enterococcus faecalis and staph aureus although the identification of the staph aureus is not complete. I empirically put her on cefdinir 300 mg after dialysis on Tuesday Thursday and Saturday she is only had one dose. She has not been systemically unwell 10/27 1 week follow-up. With considerable help of her nursing staff I was finally able to speak to her nephrologist who manages her at dialysis. Started her on vancomycin and ceftazidime. This to cover the combination of methicillin sensitive staph aureus Enterococcus faecalis and Pseudomonas. Although the staph aureus was methicillin sensitive and the Enterococcus ampicillin sensitive vancomycin provided the best alternative here that can be given  at dialysis and ceftazidime to cover the Pseudomonas. She had a traumatic wound on the top of her foot and a complicated abscess. She has  significant tissue destruction from this although the wound is looking a lot better today. We are using silver alginate to the surface. X-ray of the foot did not show any deep obvious infection or osteomyelitis 12/1; since the patient was last here she was hospitalized from 11/3 through 11/10 with coag negative staphylococcal bacteremia. I do not know the the exact cause of this was determined. She was seen by vascular and they replaced her permacath on the right side of her neck. Her repeat blood cultures were negative. She is still on IV vancomycin ando Ceftazidime at dialysis. She generally feels systemically well she does not have a fever she is still receiving dialysis but reports she voids up to 8 times a day She has home health changing the dressing I believe they are using silver alginate under 3 layer compression. In addition to the large wound on her right anterior foot that we initially saw her for she has apparently a right lateral leg wound that she suffered during a fall and 2 skin tears on the left anterior lower leg Electronic Signature(s) Signed: 09/02/2020 12:56:38 PM By: Linton Ham MD Entered By: Linton Ham on 09/01/2020 12:02:34 [name] ([id number]) -------------------------------------------------------------------------------- Physical Exam Details [name] Date of Service: 09/01/2020 10:00 AM Medical Record Number: [id number] Patient Account Number: [id number] Date of Birth/Sex: [birth year] (86 y.o. F) Treating RN: Cornell Barman Primary Care Provider: Fulton Reek Other Clinician: Referring Provider: Fulton Reek Treating Provider/Extender: Tito Dine in Treatment: 7 Cardiovascular Dorsalis pedis pulses are palpable bilaterally. Edema present in both extremities. This is mild compared to what it was when I first saw this patient.. Notes Wound exam; the area in question is on the right dorsal foot this is  certainly a lot smaller and with a lot less depth than the last time I saw this there is no evidence of surrounding infection. Under illumination I question whether the surface is viable enough for epithelialization but I elected not to debride this today oShe has a a sizable but healthy looking wound on the right lateral lower calf. This is epithelializing no evidence of infection oSkin contusions on the left anterior lower leg and ankle x2 Electronic Signature(s) Signed: 09/02/2020 12:56:38 PM By: Linton Ham MD Entered By: Linton Ham on 09/01/2020 12:10:04 [name] ([id number]) -------------------------------------------------------------------------------- Physician Orders Details [name] Date of Service: 09/01/2020 10:00 AM Medical Record Number: [id number] Patient Account Number: [id number] Date of Birth/Sex: 23-Jul-1934 (86 y.o. F) Treating RN: Cornell Barman Primary Care Provider: Fulton Reek Other Clinician: Referring Provider: Fulton Reek Treating Provider/Extender: Tito Dine in Treatment: 7 Verbal / Phone Orders: No Diagnosis Coding Wound Cleansing Wound #1 Right,Dorsal Foot o Clean wound with Normal Saline. Wound #2 Right,Anterior Lower Leg o Clean wound with Normal Saline. Wound #3 Right,Lateral Lower Leg o Clean wound with Normal Saline. Wound #4 Left,Medial Foot o Clean wound with Normal Saline. Anesthetic (add to Medication List) Wound #1 Right,Dorsal Foot o Topical Lidocaine 4% cream applied to wound bed prior to debridement (In Clinic Only). Wound #2 Right,Anterior Lower Leg o Topical Lidocaine 4% cream applied to wound bed prior to debridement (In Clinic Only). Wound #3 Right,Lateral Lower Leg o Topical Lidocaine 4% cream applied to wound bed prior to debridement (In Clinic Only). Wound #4 Left,Medial Foot o Topical  Lidocaine 4% cream applied to wound bed prior to debridement (In Clinic  Only). Primary Wound Dressing Wound #1 Right,Dorsal Foot o Hydrafera Blue Ready Transfer Wound #2 Right,Anterior Lower Leg o Hydrafera Blue Ready Transfer Wound #3 Right,Lateral Lower Leg o Hydrafera Blue Ready Transfer Wound #4 Left,Medial Foot o Hydrafera Blue Ready Transfer Secondary Dressing Wound #1 Right,Dorsal Foot o ABD pad Wound #2 Right,Anterior Lower Leg o ABD pad Wound #3 Right,Lateral Lower Leg o ABD pad Wound #4 Left,Medial Foot o ABD pad Dressing Change Frequency Wound #1 Right,Dorsal Foot o Change Dressing Monday, Wednesday, Friday Wound #2 Right,Anterior Lower Leg o Change Dressing Monday, Wednesday, Friday [place], JALIAH T. ([id number]) Wound #3 Right,Lateral Lower Leg o Change Dressing Monday, Wednesday, Friday Wound #4 Left,Medial Foot o Change Dressing Monday, Wednesday, Friday Follow-up Appointments Wound #1 Right,Dorsal Foot o Return Appointment in 1 week. Wound #2 Right,Anterior Lower Leg o Return Appointment in 1 week. Wound #3 Right,Lateral Lower Leg o Return Appointment in 1 week. Wound #4 Left,Medial Foot o Return Appointment in 1 week. Edema Control Wound #1 Right,Dorsal Foot o 3 Layer Compression System - Bilateral Wound #2 Right,Anterior Lower Leg o 3 Layer Compression System - Bilateral Wound #3 Right,Lateral Lower Leg o 3 Layer Compression System - Bilateral Wound #4 Left,Medial Foot o 3 Layer Compression System - Bilateral Additional Orders / Instructions Wound #1 Right,Dorsal Foot o Increase protein intake. Wound #2 Right,Anterior Lower Leg o Increase protein intake. Wound #3 Right,Lateral Lower Leg o Increase protein intake. Wound #4 Left,Medial Foot o Increase protein intake. Home Health Wound #1 [name] Visits - [place] Nurse may visit PRN to address patientos wound care needs. o FACE TO FACE ENCOUNTER: MEDICARE and  MEDICAID PATIENTS: I certify that this patient is under my care and that I had a face-to-face encounter that meets the physician face-to-face encounter requirements with this patient on this date. The encounter with the patient was in whole or in part for the following MEDICAL CONDITION: (primary reason for [place]) MEDICAL NECESSITY: I certify, that based on my findings, NURSING services are a medically necessary home health service. HOME BOUND STATUS: I certify that my clinical findings support that this patient is homebound (i.e., Due to illness or injury, pt requires aid of supportive devices such as crutches, cane, wheelchairs, walkers, the use of special transportation or the assistance of another person to leave their place of residence. There is a normal inability to leave the home and doing so requires considerable and taxing effort. Other absences are for medical reasons / religious services and are infrequent or of short duration when for other reasons). o If current dressing causes regression in wound condition, may D/C ordered dressing product/s and apply Normal Saline Moist Dressing daily until next [place] / Other MD appointment. [place] of regression in wound condition at [phone]. o Please direct any NON-WOUND related issues/requests for orders to patient's Primary Care Physician Wound #2 [place] Visits - [place] Nurse may visit PRN to address patientos wound care needs. o FACE TO FACE ENCOUNTER: MEDICARE and MEDICAID PATIENTS: I certify that this patient is under my care and that I had a face-to-face encounter that meets the physician face-to-face encounter requirements with this patient on this date. The encounter with the patient was in whole or in part for the following MEDICAL CONDITION: (primary reason for [place]) MEDICAL NECESSITY: I certify,  that based on  my findings, NURSING services are a medically necessary home health service. HOME BOUND STATUS: I certify that my clinical findings support that this patient is homebound (i.e., Due to illness or injury, pt requires aid of supportive devices such as crutches, cane, wheelchairs, walkers, the use of special [name], Sarah T. ([id number]) transportation or the assistance of another person to leave their place of residence. There is a normal inability to leave the home and doing so requires considerable and taxing effort. Other absences are for medical reasons / religious services and are infrequent or of short duration when for other reasons). o If current dressing causes regression in wound condition, may D/C ordered dressing product/s and apply Normal Saline Moist Dressing daily until next [place] / Other MD appointment. [place] of regression in wound condition at [phone]. o Please direct any NON-WOUND related issues/requests for orders to patient's Primary Care Physician Wound #3 Right,Lateral Lower Leg o [place] Visits - [place] Nurse may visit PRN to address patientos wound care needs. o FACE TO FACE ENCOUNTER: MEDICARE and MEDICAID PATIENTS: I certify that this patient is under my care and that I had a face-to-face encounter that meets the physician face-to-face encounter requirements with this patient on this date. The encounter with the patient was in whole or in part for the following MEDICAL CONDITION: (primary reason for [place]) MEDICAL NECESSITY: I certify, that based on my findings, NURSING services are a medically necessary home health service. HOME BOUND STATUS: I certify that my clinical findings support that this patient is homebound (i.e., Due to illness or injury, pt requires aid of supportive devices such as crutches, cane, wheelchairs, walkers, the use of special transportation or the  assistance of another person to leave their place of residence. There is a normal inability to leave the home and doing so requires considerable and taxing effort. Other absences are for medical reasons / religious services and are infrequent or of short duration when for other reasons). o If current dressing causes regression in wound condition, may D/C ordered dressing product/s and apply Normal Saline Moist Dressing daily until next [place] / Other MD appointment. [place] of regression in wound condition at [phone]. o Please direct any NON-WOUND related issues/requests for orders to patient's Primary Care Physician Wound #4 [place] Visits - [place] Nurse may visit PRN to address patientos wound care needs. o FACE TO FACE ENCOUNTER: MEDICARE and MEDICAID PATIENTS: I certify that this patient is under my care and that I had a face-to-face encounter that meets the physician face-to-face encounter requirements with this patient on this date. The encounter with the patient was in whole or in part for the following MEDICAL CONDITION: (primary reason for [place]) MEDICAL NECESSITY: I certify, that based on my findings, NURSING services are a medically necessary home health service. HOME BOUND STATUS: I certify that my clinical findings support that this patient is homebound (i.e., Due to illness or injury, pt requires aid of supportive devices such as crutches, cane, wheelchairs, walkers, the use of special transportation or the assistance of another person to leave their place of residence. There is a normal inability to leave the home and doing so requires considerable and taxing effort. Other absences are for medical reasons / religious services and are infrequent or of short duration when for other reasons). o If current dressing causes regression  in wound condition, may D/C ordered dressing  product/s and apply Normal Saline Moist Dressing daily until next [place] / Other MD appointment. [place] of regression in wound condition at [phone]. o Please direct any NON-WOUND related issues/requests for orders to patient's Primary Care Physician Electronic Signature(s) Signed: 09/02/2020 12:56:38 PM By: Linton Ham MD Signed: 09/03/2020 4:55:43 PM By: Gretta Cool, BSN, RN, CWS, Kim RN, BSN Entered By: Gretta Cool, BSN, RN, CWS, Kim on 09/01/2020 11:16:04 [name] ([id number]) -------------------------------------------------------------------------------- Problem List Details [name] Date of Service: 09/01/2020 10:00 AM Medical Record Number: [id number] Patient Account Number: [id number] Date of Birth/Sex: 04-12-1934 (86 y.o. 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/21/2020 No Yes muscle L97.811 Non-pressure chronic ulcer of other part of right lower leg limited to 09/01/2020 No Yes breakdown of skin S80.12XD Contusion of left lower leg, subsequent encounter 09/01/2020 No Yes E11.22 Type 2 diabetes mellitus with diabetic chronic kidney disease 07/14/2020 No Yes N18.5 Chronic kidney disease, stage 5 07/14/2020 No Yes Inactive Problems Resolved Problems Electronic Signature(s) Signed: 09/02/2020 12:56:38 PM By: Linton Ham MD Entered By: Linton Ham on 09/01/2020 12:00:30 [name]  ([id number]) -------------------------------------------------------------------------------- Progress Note Details [name] Date of Service: 09/01/2020 10:00 AM Medical Record Number: [id number] Patient Account Number: [id number] Date of Birth/Sex: 12-Dec-1933 (86 y.o. F) Treating RN: Cornell Barman Primary Care Provider: Fulton Reek Other Clinician: Referring Provider: Fulton Reek Treating Provider/Extender: Tito Dine in Treatment: 7 Subjective History of Present Illness (HPI) ADMISSION 07/14/2020. This is a medically complex 86 year old woman who is sent over urgently from her primary care doctor Dr. Doy Hutching at [facility] and we worked her in this afternoon. She is apparently developed a blister on the dorsal foot sometime over the last 2 weeks on the right which seems to have developed a hemorrhagic component to this. The history here is somewhat vague but this is happened since she left the hospital on 07/02/2020. She also has a blistered area on the left dorsal foot although this does not have a hemorrhagic component to it and it is not threatened to open where is the right side is definitely leaking fluid and could easily break down. I do not believe she has been doing anything specific to this. With regards to her medical history I have reviewed the discharge summary from the hospital from 06/22/2020 through 07/02/2020. 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 She was given prednisone but was not felt to be a candidate for immunosuppression secondary to her age and comorbidities. She is ANA positive p-ANCA positive. She had a right IJ catheter placed on 9/24 and is being done on dialysis. She is a diabetic. She takes prednisone 20 mg twice daily. She was on aspirin and Plavix although I think these have been put on hold. Past medical history includes;  pauci-immune crescentic glomerulonephritis. History of peripheral edema and type 2 diabetes left carotid artery stenosis with a history of a CVA, coronary artery disease and hypertension I cannot see any arterial evaluation in her record in   link. We could not do arterial ABIs on her because of complaints of discomfort 10/20; 1 week follow-up. This is a patient who is a diabetic. She recently developed acute renal failure because of pauci-immune glomerulonephritis. Sometime during her initial hospitalization she developed a wound on her right foot tremendous swelling in both legs. She was sent over here urgently last week by her primary care physician with what looks to be hematoma still contained on the right dorsal foot. We put silver alginate on this and put her in compression to control some of the swelling. The left leg had a similar appearance with severe edema in the left dorsal foot so we put that under compression as well. When she arrived back for nurse follow-up on Friday there was some drainage coming out of the wound which our nurse cultured. This is come back showing Pseudomonas Enterococcus faecalis and staph aureus although the identification of the staph aureus is not complete. I empirically put her on cefdinir 300 mg after dialysis on Tuesday Thursday and Saturday she is only had one dose. She has not been systemically unwell 10/27 1 week follow-up. With considerable help of her nursing staff I was finally able to speak to her nephrologist who manages her at dialysis. Started her on vancomycin and ceftazidime. This to cover the combination of methicillin sensitive staph aureus Enterococcus faecalis and Pseudomonas. Although the staph aureus was methicillin sensitive and the Enterococcus ampicillin sensitive vancomycin provided the best alternative here that can be given at dialysis and ceftazidime to cover the Pseudomonas. She had a traumatic wound on the top of her foot  and a complicated abscess. She has significant tissue destruction from this although the wound is looking a lot better today. We are using silver alginate to the surface. X-ray of the foot did not show any deep obvious infection or osteomyelitis 12/1; since the patient was last here she was hospitalized from 11/3 through 11/10 with coag negative staphylococcal bacteremia. I do not know the the exact cause of this was determined. She was seen by vascular and they replaced her permacath on the right side of her neck. Her repeat blood cultures were negative. She is still on IV vancomycin ando Ceftazidime at dialysis. She generally feels systemically well she does not have a fever she is still receiving dialysis but reports she voids up to 8 times a day She has home health changing the dressing I believe they are using silver alginate under 3 layer compression. In addition to the large wound on her right anterior foot that we initially saw her for she has apparently a right lateral leg wound that she suffered during a fall and 2 skin tears on the left anterior lower leg Objective Constitutional Vitals Time Taken: 10:00 AM, Height: 64 in, Weight: 205 lbs, BMI: 35.2, Temperature: 98.4 F, Pulse: 94 bpm, Respiratory Rate: 18 breaths/min, Blood Pressure: 146/74 mmHg. Cardiovascular Dorsalis pedis pulses are palpable bilaterally. Edema present in both extremities. This is mild compared to what it was when I first saw this patient.Marland Kitchen [name] ([id number]) General Notes: Wound exam; the area in question is on the right dorsal foot this is certainly a lot smaller and with a lot less depth than the last time I saw this there is no evidence of surrounding infection. Under illumination I question whether the surface is viable enough for epithelialization but I elected not to debride this today She has a a sizable but healthy looking wound on the right  lateral lower calf. This is epithelializing no  evidence of infection Skin contusions on the left anterior lower leg and ankle x2 Integumentary (Hair, Skin) Wound #1 status is Open. Original cause of wound was Blister. The wound is located on the Right,Dorsal Foot. The wound measures 4.2cm length x 3.3cm width x 0.4cm depth; 10.886cm^2 area and 4.354cm^3 volume. There is Fat Layer (Subcutaneous Tissue) exposed. There is no tunneling or undermining noted. There is a large amount of serosanguineous drainage noted. The wound margin is flat and intact. There is large (67-100%) pink, hyper - granulation within the wound bed. There is a small (1-33%) amount of necrotic tissue within the wound bed including Adherent Slough. Wound #2 status is Open. Original cause of wound was Skin Tear/Laceration. The wound is located on the Right,Anterior Lower Leg. The wound measures 2cm length x 1cm width x 0.1cm depth; 1.571cm^2 area and 0.157cm^3 volume. There is Fat Layer (Subcutaneous Tissue) exposed. There is no tunneling or undermining noted. There is a large amount of sanguinous drainage noted. There is large (67-100%) red granulation within the wound bed. Wound #3 status is Open. Original cause of wound was Skin Tear/Laceration. The wound is located on the Right,Lateral Lower Leg. The wound measures 4.5cm length x 2.7cm width x 0.1cm depth; 9.543cm^2 area and 0.954cm^3 volume. There is Fat Layer (Subcutaneous Tissue) exposed. There is no tunneling or undermining noted. There is large (67-100%) red granulation within the wound bed. Wound #4 status is Open. Original cause of wound was Skin Tear/Laceration. The wound is located on the Left,Medial Foot. The wound measures 0.1cm length x 0.9cm width x 0.1cm depth; 0.071cm^2 area and 0.007cm^3 volume. There is Fat Layer (Subcutaneous Tissue) exposed. There is no tunneling or undermining noted. There is a medium amount of serosanguineous drainage noted. There is large (67-100%) red granulation within the wound bed.  There is no necrotic tissue within the wound bed. 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 There was a Three Layer Compression Therapy Procedure with a pre-treatment ABI of 1 by Cornell Barman, RN. Post procedure Diagnosis Wound #1: Same as Pre-Procedure Wound #2 Pre-procedure diagnosis of Wound #2 is a Skin Tear located on the Right,Anterior Lower Leg . There was a Three Layer Compression Therapy Procedure with a pre-treatment ABI of 1 by Cornell Barman, RN. Post procedure Diagnosis Wound #2: Same as Pre-Procedure Wound #3 Pre-procedure diagnosis of Wound #3 is a Skin Tear located on the Right,Lateral Lower Leg . There was a Three Layer Compression Therapy Procedure with a pre-treatment ABI of 1 by Cornell Barman, RN. Post procedure Diagnosis Wound #3: Same as Pre-Procedure Wound #4 Pre-procedure diagnosis of Wound #4 is a Skin Tear located on the Left,Medial Foot . There was a Three Layer Compression Therapy Procedure with a pre-treatment ABI of 1 by Cornell Barman, RN. Post procedure Diagnosis Wound #4: Same as Pre-Procedure [name], Sarah T. ([id number]) Plan Wound Cleansing: Wound #1 Right,Dorsal Foot: Clean wound with Normal Saline. Wound #2 Right,Anterior Lower Leg: Clean wound with Normal Saline. Wound #3  Right,Lateral Lower Leg: Clean wound with Normal Saline. Wound #4 Left,Medial Foot: Clean wound with Normal Saline. Anesthetic (add to Medication  List): Wound #1 Right,Dorsal Foot: Topical Lidocaine 4% cream applied to wound bed prior to debridement (In Clinic Only). Wound #2 Right,Anterior Lower Leg: Topical Lidocaine 4% cream applied to wound bed prior to debridement (In Clinic Only). Wound #3 Right,Lateral Lower Leg: Topical Lidocaine 4% cream applied to wound bed prior to debridement (In Clinic Only). Wound #4 Left,Medial Foot: Topical Lidocaine 4% cream applied to wound bed prior to debridement (In Clinic Only). Primary Wound Dressing: Wound #1 Right,Dorsal Foot: Hydrafera Blue Ready Transfer Wound #2 Right,Anterior Lower Leg: Hydrafera Blue Ready Transfer Wound #3 Right,Lateral Lower Leg: Hydrafera Blue Ready Transfer Wound #4 Left,Medial Foot: Hydrafera Blue Ready Transfer Secondary Dressing: Wound #1 Right,Dorsal Foot: ABD pad Wound #2 Right,Anterior Lower Leg: ABD pad Wound #3 Right,Lateral Lower Leg: ABD pad Wound #4 Left,Medial Foot: ABD pad Dressing Change Frequency: Wound #1 Right,Dorsal Foot: Change Dressing Monday, Wednesday, Friday Wound #2 Right,Anterior Lower Leg: Change Dressing Monday, Wednesday, Friday Wound #3 Right,Lateral Lower Leg: Change Dressing Monday, Wednesday, Friday Wound #4 Left,Medial Foot: Change Dressing Monday, Wednesday, Friday Follow-up Appointments: Wound #1 Right,Dorsal Foot: Return Appointment in 1 week. Wound #2 Right,Anterior Lower Leg: Return Appointment in 1 week. Wound #3 Right,Lateral Lower Leg: Return Appointment in 1 week. Wound #4 Left,Medial Foot: Return Appointment in 1 week. Edema Control: Wound #1 Right,Dorsal Foot: 3 Layer Compression System - Bilateral Wound #2 Right,Anterior Lower Leg: 3 Layer Compression System - Bilateral Wound #3 Right,Lateral Lower Leg: 3 Layer Compression System - Bilateral Wound #4 Left,Medial Foot: 3 Layer Compression System - Bilateral Additional Orders / Instructions: Wound #1 Right,Dorsal Foot: Increase protein  intake. Wound #2 Right,Anterior Lower Leg: Increase protein intake. Wound #3 Right,Lateral Lower Leg: Increase protein intake. Wound #4 Left,Medial Foot: Increase protein intake. Home Health: Wound #1 Right,Dorsal Foot: [place] Visits - [place] Nurse may visit PRN to address patient s wound care needs. [name], [name] ([id number]) FACE TO FACE ENCOUNTER: MEDICARE and MEDICAID PATIENTS: I certify that this patient is under my care and that I had a face-to-face encounter that meets the physician face-to-face encounter requirements with this patient on this date. The encounter with the patient was in whole or in part for the following MEDICAL CONDITION: (primary reason for [place]) MEDICAL NECESSITY: I certify, that based on my findings, NURSING services are a medically necessary home health service. HOME BOUND STATUS: I certify that my clinical findings support that this patient is homebound (i.e., Due to illness or injury, pt requires aid of supportive devices such as crutches, cane, wheelchairs, walkers, the use of special transportation or the assistance of another person to leave their place of residence. There is a normal inability to leave the home and doing so requires considerable and taxing effort. Other absences are for medical reasons / religious services and are infrequent or of short duration when for other reasons). If current dressing causes regression in wound condition, may D/C ordered dressing product/s and apply Normal Saline Moist Dressing daily until next [place] / Other MD appointment. [place] of regression in wound condition at [phone]. Please direct any NON-WOUND related issues/requests for orders to patient's Primary Care Physician Wound #2 Right,Anterior Lower Leg: [place] Visits - [place] Nurse may visit PRN to address patient s wound care needs. FACE TO FACE  ENCOUNTER: MEDICARE and MEDICAID PATIENTS:  I certify that this patient is under my care and that I had a face-to-face encounter that meets the physician face-to-face encounter requirements with this patient on this date. The encounter with the patient was in whole or in part for the following MEDICAL CONDITION: (primary reason for [place]) MEDICAL NECESSITY: I certify, that based on my findings, NURSING services are a medically necessary home health service. HOME BOUND STATUS: I certify that my clinical findings support that this patient is homebound (i.e., Due to illness or injury, pt requires aid of supportive devices such as crutches, cane, wheelchairs, walkers, the use of special transportation or the assistance of another person to leave their place of residence. There is a normal inability to leave the home and doing so requires considerable and taxing effort. Other absences are for medical reasons / religious services and are infrequent or of short duration when for other reasons). If current dressing causes regression in wound condition, may D/C ordered dressing product/s and apply Normal Saline Moist Dressing daily until next [place] / Other MD appointment. [place] of regression in wound condition at [phone]. Please direct any NON-WOUND related issues/requests for orders to patient's Primary Care Physician Wound #3 Right,Lateral Lower Leg: [place] Visits - [place] Nurse may visit PRN to address patient s wound care needs. FACE TO FACE ENCOUNTER: MEDICARE and MEDICAID PATIENTS: I certify that this patient is under my care and that I had a face-to-face encounter that meets the physician face-to-face encounter requirements with this patient on this date. The encounter with the patient was in whole or in part for the following MEDICAL CONDITION: (primary reason for [place]) MEDICAL NECESSITY: I certify, that based on  my findings, NURSING services are a medically necessary home health service. HOME BOUND STATUS: I certify that my clinical findings support that this patient is homebound (i.e., Due to illness or injury, pt requires aid of supportive devices such as crutches, cane, wheelchairs, walkers, the use of special transportation or the assistance of another person to leave their place of residence. There is a normal inability to leave the home and doing so requires considerable and taxing effort. Other absences are for medical reasons / religious services and are infrequent or of short duration when for other reasons). If current dressing causes regression in wound condition, may D/C ordered dressing product/s and apply Normal Saline Moist Dressing daily until next [place] / Other MD appointment. [place] of regression in wound condition at [phone]. Please direct any NON-WOUND related issues/requests for orders to patient's Primary Care Physician Wound #4 Left,Medial Foot: [place] Visits - [place] Nurse may visit PRN to address patient s wound care needs. FACE TO FACE ENCOUNTER: MEDICARE and MEDICAID PATIENTS: I certify that this patient is under my care and that I had a face-to-face encounter that meets the physician face-to-face encounter requirements with this patient on this date. The encounter with the patient was in whole or in part for the following MEDICAL CONDITION: (primary reason for [place]) MEDICAL NECESSITY: I certify, that based on my findings, NURSING services are a medically necessary home health service. HOME BOUND STATUS: I certify that my clinical findings support that this patient is homebound (i.e., Due to illness or injury, pt requires aid of supportive devices such as crutches, cane, wheelchairs, walkers, the use of special transportation or the assistance of another person to leave their place of residence. There  is a normal inability to leave the home and doing so requires considerable and taxing effort. Other absences are for medical reasons / religious services and are infrequent or of short duration when for other reasons). If current dressing causes regression in wound condition, may D/C ordered dressing product/s and apply Normal Saline Moist Dressing daily until next [place] / Other MD appointment. [place] of regression in wound condition at [phone]. Please direct any NON-WOUND related issues/requests for orders to patient's Primary Care Physician #1 I am going to use Hydrofera Blue classic to all these wound areas 2. 3 layer compression bilaterally 3. Careful follow-up of the right foot wound which is her major wound. This may require debridement and I explained this to the patient in some detail but I elected to follow this to see if she can get some closure on the existing surface. The surface itself has probably fibrinous debris and some degree of hyper granulation 4. She generally looks a lot better than the last time I saw her. She is still on antibiotics at dialysis. None of her wounds look infected Electronic Signature(s) Signed: 09/02/2020 12:56:38 PM By: Linton Ham MD Entered By: Linton Ham on 09/01/2020 12:11:25 [name] ([id number]) -------------------------------------------------------------------------------- SuperBill Details [name] Date of Service: 09/01/2020 Medical Record Number: [id number] Patient Account Number: [id number] Date of Birth/Sex: [birth year] (86 y.o. F) Treating RN: Cornell Barman Primary Care Provider: Fulton Reek Other Clinician: Referring Provider: Fulton Reek Treating Provider/Extender: Tito Dine in Treatment: 7 Diagnosis Coding ICD-10 Codes Code Description S90.31XD Contusion of right foot, subsequent encounter S90.822D Blister (nonthermal), left foot,  subsequent encounter L97.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 with necrosis of muscle L97.811 Non-pressure chronic ulcer of other part of right lower leg limited to breakdown of skin S80.12XD Contusion of left lower leg, subsequent encounter E11.22 Type 2 diabetes mellitus with diabetic chronic kidney disease N18.5 Chronic kidney disease, stage 5 Facility Procedures CPT4: Description Modifier Quantity Code [id number] BILATERAL: Application of multi-layer venous compression system; leg (below knee), including 1 ankle and foot. Physician Procedures CPT4 Code: [id number] Description: 81856 - WC PHYS LEVEL 3 - EST PT Modifier: Quantity: 1 CPT4 Code: Description: ICD-10 Diagnosis Description L97.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 with necrosis of mu L97.811 Non-pressure chronic ulcer of other part of right lower leg limited to bre S90.31XD Contusion of right foot, subsequent  encounter Modifier: scle akdown of skin Quantity: Electronic Signature(s) Signed: 09/02/2020 12:56:38 PM By: Linton Ham MD Signed: 09/03/2020 4:55:43 PM By: Gretta Cool, BSN, RN, CWS, Kim RN, BSN Entered By: Gretta Cool, BSN, RN, CWS, Kim on 09/01/2020 12:30:09

## 2020-09-03 NOTE — Patient Instructions (Signed)
AV Fistula Placement  Arteriovenous (AV) fistula placement is a surgical procedure to create a connection between a blood vessel that carries blood away from your heart (artery) and a blood vessel that returns blood to your heart (vein). This connection is called a fistula. It is often made in the forearm or upper arm. You may need this procedure if you are getting hemodialysis treatments for kidney disease. An AV fistula makes your vein larger and stronger over several months. This makes the vein a safe and easy spot to insert the needles that are used for hemodialysis. Tell a health care provider about:  Any allergies you have.  All medicines you are taking, including vitamins, herbs, eye drops, creams, and over-the-counter medicines.  Any problems you or family members have had with anesthetic medicines.  Any blood disorders you have.  Any surgeries you have had.  Any medical conditions you have or have had in the past. What are the risks? Generally, this is a safe procedure. However, problems may occur, including:  Infection.  Blood clot (thrombosis).  Reduced blood flow (stenosis).  Weakening or ballooning out of the fistula (aneurysm).  Bleeding.  Allergic reactions to medicines.  Nerve damage.  Swelling near the fistula (lymphedema).  Weakening of your heart (congestive heart failure).  Failure of the procedure. What happens before the procedure? Staying hydrated Follow instructions from your health care provider about hydration, which may include:  Up to 2 hours before the procedure - you may continue to drink clear liquids, such as water, clear fruit juice, black coffee, and plain tea.  Eating and drinking restrictions Follow instructions from your health care provider about eating and drinking, which may include:  8 hours before the procedure - stop eating heavy meals or foods, such as meat, fried foods, or fatty foods.  6 hours before the procedure - stop  eating light meals or foods, such as toast or cereal.  6 hours before the procedure - stop drinking milk or drinks that contain milk.  2 hours before the procedure - stop drinking clear liquids. Medicines Ask your health care provider about:  Changing or stopping your regular medicines. This is especially important if you are taking diabetes medicines or blood thinners.  Taking medicines such as aspirin and ibuprofen. These medicines can thin your blood. Do not take these medicines unless your health care provider tells you to take them.  Taking over-the-counter medicines, vitamins, herbs, and supplements. General instructions  Imaging tests of your arm may be done to find the best place for the fistula.  Plan to have someone take you home from the hospital or clinic.  Ask your health care provider how your surgical site will be marked or identified.  Ask your health care provider what steps will be taken to help prevent infection. These may include: ? Removing hair at the surgery site. ? Washing skin with a germ-killing soap. ? Taking antibiotic medicine.  Do not use any products that contain nicotine or tobacco for as long as possible before and after the procedure. These products include cigarettes, e-cigarettes, and chewing tobacco. If you need help quitting, ask your health care provider. What happens during the procedure?  An IV will be inserted into one of your veins.  You will be given one or more of the following: ? A medicine to help you relax (sedative). ? A medicine to numb the area (local anesthetic). ? A medicine to make you fall asleep (general anesthetic). ? A medicine that  is injected into an area of your body to numb everything below the injection site (regional anesthetic).  The fistula site will be cleaned with a germ-killing solution (antiseptic).  An incision will be made on the inner side of your arm.  A vein and an artery will be opened and connected  with stitches (sutures).  The incision will be closed with sutures or clips.  A bandage (dressing) will be placed over the area. The procedure may vary among health care providers and hospitals. What happens after the procedure?  Your blood pressure, heart rate, breathing rate, and blood oxygen level may be monitored until you leave the hospital or clinic.  Your fistula site will be checked for bleeding or swelling.  You will be given pain medicine as needed.  Do not drive for 24 hours if you were given a sedative during your procedure. Summary  Arteriovenous (AV) fistula placement is a surgical procedure to create a connection between a blood vessel that carries blood away from your heart (artery) and a blood vessel that returns blood to your heart (vein). This connection is called a fistula.  Follow instructions from your health care provider about eating and drinking before the procedure.  Ask your health care provider about changing or stopping your regular medicines before the procedure. This is especially important if you are taking diabetes medicines or blood thinners.  Do not drive for 24 hours if you were given a sedative during your procedure.  Plan to have someone take you home from the hospital or clinic. This information is not intended to replace advice given to you by your health care provider. Make sure you discuss any questions you have with your health care provider. Document Revised: 03/07/2019 Document Reviewed: 03/25/2018 Else[employer] Patient Education  [employer].

## 2020-09-03 NOTE — Progress Notes (Signed)
MRN : [id number]  [name] is a 86 y.o. (12/23/1933) female who presents with chief complaint of  Chief Complaint  Patient presents with  . Follow-up    ref [place] vein mapping  .  History of Present Illness: Patient returns today in follow up.  She is doing reasonably well.  Dialysis really wipes her out still.  She is using a PermCath placed a little under a month ago and is here to evaluate for permanent dialysis access.  Vein mapping today shows adequate cephalic vein at the antecubital fossa bilaterally.  It is a little larger on the right than the left, but she is right-hand dominant.  The basilic veins were also adequate bilaterally.  Triphasic arterial flow was seen in both upper extremities. She also has multiple other issues.  Her leg swelling is stable.  She is currently getting wraps weekly.  She sees the wound care center for her legs as well as a wound on her right foot which is currently dressed. She also has carotid disease.  No focal neurologic symptoms since her last visit.  This was studied about 6 to 8 weeks ago and found to be in the moderate range bilaterally.  This is to be checked again next spring.  Current Outpatient Medications  Medication Sig Dispense Refill  . ALPRAZolam (XANAX) 0.5 MG tablet Take 0.5 mg by mouth 2 (two) times daily as needed for anxiety.     Marland Kitchen aspirin 81 MG chewable tablet Chew 81 mg by mouth every Monday, Wednesday, and Friday.     . calcium-vitamin D (OSCAL WITH D) 500-200 MG-UNIT tablet Take 1 tablet by mouth daily.    . cetirizine (ZYRTEC) 10 MG tablet Take 10 mg by mouth daily.    . clopidogrel (PLAVIX) 75 MG tablet Take 75 mg by mouth in the morning and at bedtime.     . Cyanocobalamin 1000 MCG SUBL Take 1,000 mcg by mouth daily.     Marland Kitchen gabapentin (NEURONTIN) 100 MG capsule Take 100 mg by mouth 2 (two) times daily.    Marland Kitchen HYDROcodone-acetaminophen (NORCO/VICODIN) 5-325 MG tablet Take 1 tablet by mouth every 6 (six) hours as needed  for moderate pain.    Marland Kitchen levothyroxine (SYNTHROID, LEVOTHROID) 150 MCG tablet Take 150 mcg daily before breakfast by mouth.    . metoprolol tartrate (LOPRESSOR) 50 MG tablet Take 50 mg by mouth in the morning, at noon, and at bedtime.    . torsemide (DEMADEX) 100 MG tablet Take 1 tablet (100 mg total) by mouth daily. 30 tablet 0  . glimepiride (AMARYL) 4 MG tablet Take 4 mg by mouth daily with breakfast.  (Patient not taking: Reported on 08/20/2020)     No current facility-administered medications for this visit.    Past Medical History:  Diagnosis Date  . Angina pectoris ([place]) 08/26/2012  . Arthritis   . Breast cancer ([place]) 2012   right breast  . Breast mass, right   . Cancer [facility])    breast cancer, right side 2012  . Chronic kidney disease    RENAL INSUFF  . Complication of anesthesia   . Coronary artery disease 08/22/2012   sees Dr Rockey Situ  . Diabetes ([place])   . Dyspnea    ON EXERTION  . Gastritis    hx of  . GERD (gastroesophageal reflux disease)   . Headache(784.0)    migraines  . History of breast cancer    39 treatments of radiation. Negative chemo.  Marland Kitchen  History of seasonal allergies   . Hyperlipemia   . Hypertension    sees Dr. Fulton Reek  . Hypothyroidism   . Kidney disease, chronic, stage IV (GFR 15-29 ml/min) ([facility])   . Macular degeneration    Bilateral  . Macular degeneration 2018  . Neuropathy   . Personal history of radiation therapy   . Pneumonia    hx of  . PONV (postoperative nausea and vomiting)   . 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 Stroke ([place])    2012  . Thyroid disease   . Vertigo     Past Surgical History:  Procedure Laterality Date  . ABDOMINAL HYSTERECTOMY    . APPENDECTOMY    . BACK SURGERY    . BREAST BIOPSY Right    2012 positive- [facility]  . BREAST LUMPECTOMY Right 2012   F/U radiation   . BREAST SURGERY    . CARDIAC CATHETERIZATION  2014  . CAROTID PTA/STENT  INTERVENTION Left 06/14/2020   Procedure: CAROTID PTA/STENT INTERVENTION;  Surgeon: Algernon Huxley, MD;  Location: [place] [facility];  Service: Cardiovascular;  Laterality: Left;  . CATARACT EXTRACTION W/PHACO Right 08/14/2017   Procedure: CATARACT EXTRACTION PHACO AND INTRAOCULAR LENS PLACEMENT (I[facility]);  Surgeon: Birder Robson, MD;  Location: [facility];  Service: Ophthalmology;  Laterality: Right;  [place] 00:43.0 AP% 17.1 CDE 7.37 Fluid Pack lot # [id number]  . CATARACT EXTRACTION W/PHACO Left 10/09/2017   Procedure: CATARACT EXTRACTION PHACO AND INTRAOCULAR LENS PLACEMENT ([place]);  Surgeon: Birder Robson, MD;  Location: [facility];  Service: Ophthalmology;  Laterality: Left;  [place] 00:30.3 AP% 11.0 CDE 3.33 Fluid Pack Lot # [id number]  . CORONARY ARTERY BYPASS GRAFT  09/05/2012   Procedure: CORONARY ARTERY BYPASS GRAFTING (CABG);  Surgeon: Rexene Alberts, MD;  Location: [place];  Service: Open Heart Surgery;  Laterality: N/A;  CABG x four, using left internal mammary artery and bilateral greater saphenous vein harvested endoscopically  . DIALYSIS/PERMA CATHETER INSERTION Left 06/25/2020   Procedure: DIALYSIS/PERMA CATHETER INSERTION;  Surgeon: Algernon Huxley, MD;  Location: [place] [facility];  Service: Cardiovascular;  Laterality: Left;  . DIALYSIS/PERMA CATHETER INSERTION N/A 08/10/2020   Procedure: DIALYSIS/PERMA CATHETER INSERTION;  Surgeon: Katha Cabal, MD;  Location: [place] [facility];  Service: Cardiovascular;  Laterality: N/A;  . DIALYSIS/PERMA CATHETER REMOVAL N/A 08/05/2020   Procedure: DIALYSIS/PERMA CATHETER REMOVAL;  Surgeon: Algernon Huxley, MD;  Location: [place] [facility];  Service: Cardiovascular;  Laterality: N/A;  . INTRAOPERATIVE TRANSESOPHAGEAL ECHOCARDIOGRAM  09/05/2012   Procedure: INTRAOPERATIVE TRANSESOPHAGEAL ECHOCARDIOGRAM;  Surgeon: Rexene Alberts, MD;  Location: [place];  Service: Open Heart Surgery;  Laterality: N/A;  . KNEE SURGERY     Bilateral nerve block  .  LAPAROSCOPIC NISSEN FUNDOPLICATION    . OVARIAN CYST REMOVAL       Social History   Tobacco Use  . Smoking status: Former Smoker    Types: Cigarettes    Quit date: 11/13/1988    Years since quitting: 31.8  . Smokeless tobacco: Never Used  Vaping Use  . Vaping Use: Never used  Substance Use Topics  . Alcohol use: No  . Drug use: No     Family History  Problem Relation Age of Onset  . Heart disease Brother        CABG & stents  . Hyperlipidemia Brother   . Hypertension Brother   .  Cancer Father   . Heart disease Mother   . Breast cancer Cousin   . Kidney disease Neg Hx      Allergies  Allergen Reactions  . Clonidine Other (See Comments) and Shortness Of Breath    Other reaction(s): Other (see comments) Other reaction(s): Other (See Comments)  . Darvocet [Propoxyphene N-Acetaminophen] Anaphylaxis  . Hydralazine     Other reaction(s): Other (See Comments) glomerulonephritis  . Hydralazine Hcl Other (See Comments)    glomerulonephritis  . Esomeprazole     Other reaction(s): Unknown  . Guaifenesin     Other reaction(s): Unknown Other reaction(s): Unknown Other reaction(s): Unknown Other reaction(s): Unknown  . Phenylephrine-Guaifenesin     Other reaction(s): Unknown  . Rabeprazole     Other reaction(s): Unknown  . Telithromycin     Other reaction(s): Unknown Other reaction(s): Unknown Other reaction(s): Unknown  . Fluocinolone Other (See Comments)    Feels crazy Other reaction(s): Other (see comments) Other reaction(s): Other (See Comments) Feels crazy Feels crazy  . Iodinated Diagnostic Agents Nausea And Vomiting and Other (See Comments)    Burning Other reaction(s): Other (see comments) Other reaction(s): Other (See Comments) Burning Nausea & vomiting  . Levaquin [Levofloxacin] Nausea Only  . Statins Other (See Comments)    Myalgia Other reaction(s): Other (See Comments), Unknown, Unknown Myalgia    REVIEW OF SYSTEMS(Negative unless  checked)  Constitutional: [] ??Weight loss [] ??Fever [] ??Chills Cardiac: [] ??Chest pain [] ??Chest pressure [] ??Palpitations [] ??Shortness of breath when laying flat [] ??Shortness of breath at rest [x] ??Shortness of breath with exertion. Vascular: [] ??Pain in legs with walking [] ??Pain in legs at rest [] ??Pain in legs when laying flat [] ??Claudication [] ??Pain in feet when walking [] ??Pain in feet at rest [] ??Pain in feet when laying flat [] ??History of DVT [] ??Phlebitis [] ??Swelling in legs [] ??Varicose veins [] ??Non-healing ulcers Pulmonary: [] ??Uses home oxygen [] ??Productive cough [] ??Hemoptysis [] ??Wheeze [] ??COPD [] ??Asthma Neurologic: [] ??Dizziness [] ??Blackouts [] ??Seizures [x] ??History of stroke [] ??History of TIA [] ??Aphasia [] ??Temporary blindness [] ??Dysphagia [] ??Weakness or numbness in arms [] ??Weakness or numbness in legs Musculoskeletal: [x] ??Arthritis [] ??Joint swelling [x] ??Joint pain [] ??Low back pain Hematologic: [x] ??Easy bruising [] ??Easy bleeding [] ??Hypercoagulable state [] ??Anemic [] ??Hepatitis Gastrointestinal: [] ??Blood in stool [] ??Vomiting blood [x] ??Gastroesophageal reflux/heartburn [] ??Abdominal pain Genitourinary: [x] ??Chronic kidney disease [] ??Difficult urination [] ??Frequent urination [] ??Burning with urination [] ??Hematuria Skin: [] ??Rashes [] ??Ulcers [] ??Wounds Psychological: [] ??History of anxiety [] ??History of major depression.  Physical Examination  BP 122/69 (BP Location: Left Arm)   Pulse 83   Resp 16  Gen:  WD/WN, NAD Head: [place]/AT, No temporalis wasting. Ear/Nose/Throat: Hearing grossly intact, nares w/o erythema or drainage Eyes: Conjunctiva clear. Sclera non-icteric Neck: Supple.  Trachea midline Pulmonary:  Good air movement, no use of accessory muscles.  Cardiac: irregular Vascular: PermCath exiting from right chest Vessel Right Left  Radial  Palpable Palpable           Musculoskeletal: M/S 5/5 throughout.  No deformity or atrophy.  Using a wheelchair.  2+ bilateral lower extremity edema. Neurologic: Sensation grossly intact in extremities.  Symmetrical.  Speech is fluent.  Psychiatric: Judgment intact, Mood & affect appropriate for pt's clinical situation.        Labs   Radiology PERIPHERAL VASCULAR CATHETERIZATION  Result Date: 08/10/2020 See op note  PERIPHERAL VASCULAR CATHETERIZATION  Result Date: 08/05/2020 See op note  DG Foot Complete Right  Result Date: 08/04/2020 CLINICAL DATA:  Right foot ulcer, initial encounter EXAM: RIGHT FOOT COMPLETE - 3+ VIEW COMPARISON:  None. FINDINGS: No acute fracture or dislocation is noted. Soft tissue swelling is noted with focal ulceration dorsally identified consistent  with the given history. No underlying bony erosive changes are seen at this time. IMPRESSION: Soft tissue swelling with focal ulceration dorsally. No definitive bony erosive changes are seen. Electronically Signed   By: Inez Catalina M.D.   On: 08/04/2020 19:03   ECHOCARDIOGRAM COMPLETE  Result Date: 08/06/2020    ECHOCARDIOGRAM REPORT   Patient Name:   [name] Date of Exam: 08/05/2020 Medical Rec #:  [id number]         Height:       64.0 in Accession #:    [id number]        Weight:       203.7 lb Date of Birth:  March 24, 1934         BSA:          1.972 m Patient Age:    78 years          BP:           113/63 mmHg Patient Gender: F                 HR:           97 bpm. Exam Location:  [facility] Procedure: 2D Echo, Color Doppler and Cardiac Doppler Indications:     R78.81 Stroke  History:         Patient has prior history of Echocardiogram examinations, most                  recent 06/23/2020. CAD, Prior CABG, Stroke and CKD; Risk                  Factors:Diabetes, Hypertension and Dyslipidemia.  Sonographer:     Charmayne Sheer RDCS (AE) Referring Phys:  [id number] Sidney Ace Diagnosing Phys: Ida Rogue MD   Sonographer Comments: No subcostal window. IMPRESSIONS  1. Left ventricular ejection fraction, by estimation, is 60 to 65%. The left ventricle has normal function. The left ventricle has no regional wall motion abnormalities. Left ventricular diastolic parameters are consistent with Grade I diastolic dysfunction (impaired relaxation).  2. Right ventricular systolic function is normal. The right ventricular size is normal.  3. Left atrial size was mildly dilated. FINDINGS  Left Ventricle: Left ventricular ejection fraction, by estimation, is 60 to 65%. The left ventricle has normal function. The left ventricle has no regional wall motion abnormalities. The left ventricular internal cavity size was normal in size. There is  no left ventricular hypertrophy. Left ventricular diastolic parameters are consistent with Grade I diastolic dysfunction (impaired relaxation). Right Ventricle: The right ventricular size is normal. No increase in right ventricular wall thickness. Right ventricular systolic function is normal. Left Atrium: Left atrial size was mildly dilated. Right Atrium: Right atrial size was normal in size. Pericardium: There is no evidence of pericardial effusion. Mitral Valve: The mitral valve is normal in structure. No evidence of mitral valve regurgitation. No evidence of mitral valve stenosis. MV peak gradient, 4.2 mmHg. The mean mitral valve gradient is 2.0 mmHg. Tricuspid Valve: The tricuspid valve is normal in structure. Tricuspid valve regurgitation is not demonstrated. No evidence of tricuspid stenosis. Aortic Valve: The aortic valve is normal in structure. Aortic valve regurgitation is not visualized. Mild to moderate aortic valve sclerosis/calcification is present, without any evidence of aortic stenosis. Aortic valve mean gradient measures 8.0 mmHg. Aortic valve peak gradient measures 13.7 mmHg. Aortic valve area, by VTI measures 1.63 cm. Pulmonic Valve: The pulmonic valve was normal in structure.  Pulmonic valve regurgitation is not  visualized. No evidence of pulmonic stenosis. Aorta: The aortic root is normal in size and structure. Venous: The inferior vena cava is normal in size with greater than 50% respiratory variability, suggesting right atrial pressure of 3 mmHg. IAS/Shunts: No atrial level shunt detected by color flow Doppler.  LEFT VENTRICLE PLAX 2D LVIDd:         4.45 cm  Diastology LVIDs:         2.56 cm  LV e' medial:    6.42 cm/s LV PW:         1.03 cm  LV E/e' medial:  11.5 LV IVS:        0.90 cm  LV e' lateral:   6.74 cm/s LVOT diam:     1.90 cm  LV E/e' lateral: 10.9 LV SV:         51 LV SV Index:   26 LVOT Area:     2.84 cm  LEFT ATRIUM             Index LA diam:        3.80 cm 1.93 cm/m LA Vol (A2C):   49.0 ml 24.85 ml/m LA Vol (A4C):   70.9 ml 35.96 ml/m LA Biplane Vol: 59.3 ml 30.08 ml/m  AORTIC VALVE                    PULMONIC VALVE AV Area (Vmax):    1.53 cm     PV Vmax:       1.25 m/s AV Area (Vmean):   1.53 cm     PV Vmean:      81.700 cm/s AV Area (VTI):     1.63 cm     PV VTI:        0.212 m AV Vmax:           185.00 cm/s  PV Peak grad:  6.2 mmHg AV Vmean:          138.000 cm/s PV Mean grad:  3.0 mmHg AV VTI:            0.314 m AV Peak Grad:      13.7 mmHg AV Mean Grad:      8.0 mmHg LVOT Vmax:         99.80 cm/s LVOT Vmean:        74.500 cm/s LVOT VTI:          0.181 m LVOT/AV VTI ratio: 0.58  AORTA Ao Root diam: 2.90 cm MITRAL VALVE MV Area (PHT): 5.66 cm     SHUNTS MV Peak grad:  4.2 mmHg     Systemic VTI:  0.18 m MV Mean grad:  2.0 mmHg     Systemic Diam: 1.90 cm MV Vmax:       1.02 m/s MV Vmean:      70.7 cm/s MV Decel Time: 134 msec MV E velocity: 73.70 cm/s MV A velocity: 104.00 cm/s MV E/A ratio:  0.71 Ida Rogue MD Electronically signed by Ida Rogue MD Signature Date/Time: 08/06/2020/7:24:42 AM    Final     Assessment/Plan Diabetes mellitus ([place]) blood glucose control important in reducing the progression of atherosclerotic disease. Also, involved in  wound healing. On appropriate medications.   Hyperlipidemia lipid control important in reducing the progression of atherosclerotic disease. Continue statin therapy  Carotid stenosis We performed a carotid duplex recently which correlates much more with her catheter-based angiogram findings and would be in the 40 to 59% range bilaterally.  Particularly given her  extensive medical comorbidities, no intervention or surgery would be recommended at this time.  Continue current medical regimen.  Recheck in 6 months.  HTN, goal below 140/80 blood pressure control important in reducing the progression of atherosclerotic disease. On appropriate oral medications.  ESRD on hemodialysis ([place]) Vein mapping today shows adequate cephalic vein at the antecubital fossa bilaterally.  It is a little larger on the right than the left, but she is right-hand dominant.  The basilic veins were also adequate bilaterally.  Triphasic arterial flow was seen in both upper extremities. I had a discussion with she and her husband today.  We could perform brachiocephalic AV fistulas in either upper extremity I believe.  The vein is bigger on the right, but she does not want it on the right as she has had had breast cancer surgery on the right side and she is right-hand dominant.  That is certainly reasonable.  We discussed that if the left arm fistula fails to mature her or eventually fails, we may need to the right arm.  She is okay with this.    [name], MD  09/03/2020 12:33 PM    This note was created with Dragon medical transcription system.  Any errors from dictation are purely unintentional

## 2020-09-03 NOTE — Progress Notes (Signed)
TIERSA, DAYLEY ([id number]) Visit Report for 09/01/2020 Arrival Information Details [name], [name] Date of Service: 09/01/2020 10:00 AM Medical Record Number: [id number] Patient Account Number: [id number] Date of Birth/Sex: [birth year] (86 y.o. F) Treating RN: Cornell Barman Primary Care Shivali Quackenbush: Fulton Reek Other Clinician: Referring Josaiah Muhammed: Fulton Reek Treating Tashonda Pinkus/Extender: Tito Dine in Treatment: 7 Visit Information History Since Last Visit Has Dressing in Place as Prescribed: Yes Patient Arrived: Wheel Chair Pain Present Now: No Arrival Time: 10:04 Accompanied By: husband Transfer Assistance: Manual Patient Identification Verified: Yes Secondary Verification Process Completed: Yes Patient Requires Transmission-Based Precautions: No Patient Has Alerts: No Electronic Signature(s) Signed: 09/03/2020 4:55:43 PM By: Gretta Weiss, BSN, RN, CWS, Kim RN, BSN [name], BSN, RN, CWS, Kim on 09/01/2020 10:04:28 [name] ([id number]) -------------------------------------------------------------------------------- Compression Therapy Details [name] Date of Service: 09/01/2020 10:00 AM Medical Record Number: [id number] Patient Account Number: [id number] Date of Birth/Sex: 03-10-1934 (86 y.o. F) Treating RN: Cornell Barman Primary Care Rhyker Silversmith: Fulton Reek Other Clinician: Referring Sharniece Gibbon: Fulton Reek Treating Lynsee Wands/Extender: Tito Dine in Treatment: 7 Compression Therapy Performed for Wound Assessment: Wound #3 Right,Lateral Lower Leg Performed By: Clinician Cornell Barman, RN Compression Type: Three Layer Pre Treatment ABI: 1 Post Procedure Diagnosis Same as Pre-procedure Electronic Signature(s) Signed: 09/03/2020 4:55:43 PM By: Gretta Weiss, BSN, RN, CWS, Kim RN, BSN [name], BSN, RN, CWS, Kim on 09/01/2020 11:14:45 [name]  ([id number]) -------------------------------------------------------------------------------- Compression Therapy Details [name] Date of Service: 09/01/2020 10:00 AM Medical Record Number: [id number] Patient Account Number: [id number] Date of Birth/Sex: 07/15/1934 (86 y.o. F) Treating RN: Cornell Barman Primary Care Haston Casebolt: Fulton Reek Other Clinician: Referring Estee Yohe: Fulton Reek Treating Daun Rens/Extender: Tito Dine in Treatment: 7 Compression Therapy Performed for Wound Assessment: Wound #2 Right,Anterior Lower Leg Performed By: Clinician Cornell Barman, RN Compression Type: Three Layer Pre Treatment ABI: 1 Post Procedure Diagnosis Same as Pre-procedure Electronic Signature(s) Signed: 09/03/2020 4:55:43 PM By: Gretta Weiss, BSN, RN, CWS, Kim RN, BSN [name], BSN, RN, CWS, Kim on 09/01/2020 11:14:46 [name] ([id number]) -------------------------------------------------------------------------------- Compression Therapy Details [name] Date of Service: 09/01/2020 10:00 AM Medical Record Number: [id number] Patient Account Number: [id number] Date of Birth/Sex: 1934/08/19 (86 y.o. F) Treating RN: Cornell Barman Primary Care Jonita Hirota: Fulton Reek Other Clinician: Referring Bertie Mcconathy: Fulton Reek Treating Asheley Hellberg/Extender: Tito Dine in Treatment: 7 Compression Therapy Performed for Wound Assessment: Wound #4 Left,Medial Foot Performed By: Clinician Cornell Barman, RN Compression Type: Three Layer Pre Treatment ABI: 1 Post Procedure Diagnosis Same as Pre-procedure Electronic Signature(s) Signed: 09/03/2020 4:55:43 PM By: Gretta Weiss, BSN, RN, CWS, Kim RN, BSN [name], BSN, RN, CWS, Kim on 09/01/2020 11:14:46 [name] ([id number]) -------------------------------------------------------------------------------- Compression Therapy Details [name] Date of Service: 09/01/2020 10:00 AM Medical Record Number: [id number] Patient Account Number: [id number] Date of Birth/Sex: May 12, 1934 (86 y.o. F) Treating RN: Cornell Barman Primary Care Shye Doty: Fulton Reek Other Clinician: Referring Max Nuno: Fulton Reek Treating Jemimah Cressy/Extender: Tito Dine in Treatment: 7 Compression Therapy Performed for Wound Assessment: Wound #1 Right,Dorsal Foot Performed By: Clinician Cornell Barman, RN Compression Type: Three Layer Pre Treatment ABI: 1 Post Procedure Diagnosis Same as Pre-procedure Notes Patient is tolerating the wraps well Electronic Signature(s) Signed: 09/03/2020 4:55:43 PM By: Gretta Weiss, BSN, RN, CWS, Kim RN, BSN [name], BSN, RN, CWS, Kim on 09/01/2020 12:31:49 [name] ([id number]) -------------------------------------------------------------------------------- Encounter Discharge Information  Details [name], [name] Date of Service: 09/01/2020 10:00 AM Medical Record Number: [id number] Patient Account Number: [id number] Date of Birth/Sex: January 24, 1934 (86 y.o. F) Treating RN: Cornell Barman Primary Care Jaydin Boniface: Fulton Reek Other Clinician: Referring Alaa Mullally: Fulton Reek Treating Reuven Braver/Extender: Tito Dine in Treatment: 7 Encounter Discharge Information Items Discharge Condition: Stable Ambulatory Status: Ambulatory Discharge Destination: Home Transportation: Private Auto Accompanied By: husband Schedule Follow-up Appointment: Yes Clinical Summary of Care: Electronic Signature(s) Signed: 09/03/2020 4:55:43 PM By: Gretta Weiss, BSN, RN, CWS, Kim RN, BSN [name], BSN, RN, CWS, Kim on 09/01/2020 12:37:49 [name] ([id number]) -------------------------------------------------------------------------------- Lower Extremity Assessment Details [name] Date of Service: 09/01/2020 10:00 AM Medical Record Number:  [id number] Patient Account Number: [id number] Date of Birth/Sex: 06-23-1934 (86 y.o. F) Treating RN: Cornell Barman Primary Care Drianna Chandran: Fulton Reek Other Clinician: Referring Alva Broxson: Fulton Reek Treating Aneisa Karren/Extender: Tito Dine in Treatment: 7 Edema Assessment Assessed: [Left: Yes] [Right: Yes] Edema: [Left: Yes] [Right: Yes] Calf Left: Right: Point of Measurement: 31 cm From Medial Instep 37.5 cm 39 cm Ankle Left: Right: Point of Measurement: 11 cm From Medial Instep 23 cm 25.8 cm Vascular Assessment Pulses: Dorsalis Pedis Palpable: [Left:Yes] [Right:Yes] Electronic Signature(s) Signed: 09/03/2020 4:55:43 PM By: Gretta Weiss, BSN, RN, CWS, Kim RN, BSN [name], BSN, RN, CWS, Kim on 09/01/2020 10:46:17 [name] ([id number]) -------------------------------------------------------------------------------- Multi Wound Chart Details [name] Date of Service: 09/01/2020 10:00 AM Medical Record Number: [id number] Patient Account Number: [id number] Date of Birth/Sex: 27-Feb-1934 (86 y.o. 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 Comorbid History: Congestive Heart Failure, Congestive Heart Failure, Congestive Heart Failure, Hypertension, Type II Diabetes, Hypertension, Type II Diabetes, Hypertension, Type II Diabetes, Received Radiation Received Radiation Received  Radiation Date Acquired: 07/02/2020 08/30/2020 08/30/2020 Weeks of Treatment: 7 0 0 Wound Status: Open Open Open Measurements L x W x D (cm) 4.2x3.3x0.4 2x1x0.1 4.5x2.7x0.1 Area (cm) : 10.886 1.571 9.543 Volume (cm) : 4.354 0.157 0.954 % Reduction in Area: 58.00% 0.00% 0.00% % Reduction in Volume: -68.00% 0.00% 0.00% Classification: Grade 2 Full Thickness Without Exposed Full Thickness Without Exposed Support Structures Support Structures Exudate Amount: Large Large N/A Exudate Type: Serosanguineous Sanguinous N/A Exudate Color: red, brown red N/A Wound Margin: Flat and Intact N/A N/A Granulation Amount: Large (67-100%) Large (67-100%) Large (67-100%) Granulation Quality: Pink, Hyper-granulation Red Red Necrotic Amount: Small (1-33%) N/A N/A Exposed Structures: Fat Layer (Subcutaneous Tissue): Fat Layer (Subcutaneous Tissue): Fat Layer (Subcutaneous Tissue): Yes Yes Yes Fascia: No Fascia: No Fascia: No Tendon: No Tendon: No Tendon: No Muscle: No Muscle: No Muscle: No Joint: No Joint: No Joint: No Bone: No Bone: No Bone: No Epithelialization: None None None Procedures Performed: Compression Therapy Compression Therapy Compression Therapy Wound Number: 4 N/A N/A Photos: N/A N/A [name] ([id number]) Wound Location: Left, Medial Foot N/A N/A Wounding Event: Skin Tear/Laceration N/A N/A Primary Etiology: Skin Tear N/A N/A Comorbid History: Congestive Heart Failure, N/A N/A Hypertension, Type II Diabetes, Received Radiation Date Acquired: 08/30/2020 N/A N/A Weeks of Treatment: 0  N/A N/A Wound Status: Open N/A N/A Measurements L x W x D (cm) 0.1x0.9x0.1 N/A N/A Area (cm) : 0.071 N/A N/A Volume (cm) : 0.007 N/A N/A % Reduction in Area: 0.00% N/A N/A % Reduction in Volume: 0.00% N/A N/A Classification: Full Thickness Without Exposed N/A N/A Support Structures Exudate Amount: Medium N/A N/A Exudate Type: Serosanguineous N/A N/A Exudate Color: red,  brown N/A N/A Wound Margin: N/A N/A N/A Granulation Amount: Large (67-100%) N/A N/A Granulation Quality: Red N/A N/A Necrotic Amount: None Present (0%) N/A N/A Exposed Structures: Fat Layer (Subcutaneous Tissue): N/A N/A Yes Fascia: No Tendon: No Muscle: No Joint: No Bone: No Epithelialization: None N/A N/A Procedures Performed: Compression Therapy N/A N/A Treatment Notes Electronic Signature(s) Signed: 09/02/2020 12:56:38 PM By: Linton Ham MD Sarah By: Linton Ham on 09/01/2020 12:00:39 [name] ([id number]) -------------------------------------------------------------------------------- [place] Details [name] Date of Service: 09/01/2020 10:00 AM Medical Record Number: [id number] Patient Account Number: [id number] Date of Birth/Sex: 19-Apr-1934 (86 y.o. F) Treating RN: Cornell Barman Primary Care Jex Strausbaugh: Fulton Reek Other Clinician: Referring Kateline Kinkade: Fulton Reek Treating Stephen Turnbaugh/Extender: Tito Dine in Treatment: 7 Active Inactive Necrotic Tissue Nursing Diagnoses: Impaired tissue integrity related to necrotic/devitalized tissue Goals: Necrotic/devitalized tissue will be minimized in the wound bed Date Initiated: 07/14/2020 Target Resolution Date: 08/14/2020 Goal Status: Active Interventions: Assess patient pain level pre-, during and post procedure and prior to discharge Treatment Activities: Apply topical anesthetic as ordered : 07/14/2020 Notes: Orientation to the Wound Care Program Nursing Diagnoses: Knowledge deficit related to the wound healing center program Goals: Patient/caregiver will verbalize understanding of the [place] Date Initiated: 07/14/2020 Target Resolution Date: 08/13/2020 Goal Status: Active Interventions: Provide education on orientation to the wound center Notes: Wound/Skin Impairment Nursing Diagnoses: Impaired tissue  integrity Goals: Patient/caregiver will verbalize understanding of skin care regimen Date Initiated: 07/14/2020 Target Resolution Date: 07/14/2020 Goal Status: Active Interventions: Assess ulceration(s) every visit Treatment Activities: Topical wound management initiated : 07/14/2020 Notes: Electronic Signature(s) Signed: 09/03/2020 4:55:43 PM By: Gretta Weiss, BSN, RN, CWS, Kim RN, BSN [name], BSN, RN, CWS, Kim on 09/01/2020 11:14:07 [name] ([id number] Weiss, [name] ([id number]) -------------------------------------------------------------------------------- Pain Assessment Details [name] Date of Service: 09/01/2020 10:00 AM Medical Record Number: [id number] Patient Account Number: [id number] Date of Birth/Sex: [birth year] (86 y.o. F) Treating RN: Cornell Barman Primary Care Ilyana Manuele: Fulton Reek Other Clinician: Referring Teanna Elem: Fulton Reek Treating Derin Matthes/Extender: Tito Dine in Treatment: 7 Active Problems Location of Pain Severity and Description of Pain Patient Has Paino No Site Locations Pain Management and Medication Current Pain Management: Notes Patient denies pain at this time. Electronic Signature(s) Signed: 09/03/2020 4:55:43 PM By: Gretta Weiss, BSN, RN, CWS, Kim RN, BSN [name], BSN, RN, CWS, Kim on 09/01/2020 10:05:37 [name] ([id number]) -------------------------------------------------------------------------------- Patient/Caregiver Education Details [name] Date of Service: 09/01/2020 10:00 AM Medical Record Number: [id number] Patient Account Number: [id number] Date of Birth/Gender: 09-10-1934 (86 y.o. F) Treating RN: Cornell Barman Primary Care Physician: Fulton Reek Other Clinician: Referring Physician: Fulton Reek Treating Physician/Extender: Tito Dine in Treatment: 7 Education Assessment Education Provided  To: Patient Education Topics Provided Wound/Skin Impairment: Handouts: Caring for Your Ulcer, Other: Patient to continue wound care as prescribed Methods: Demonstration, Explain/Verbal Responses: State content correctly Electronic Signature(s) Signed: 09/03/2020 4:55:43 PM By: Gretta Weiss, BSN, RN, CWS, Kim RN, BSN [name], BSN, RN, CWS, Kim on 09/01/2020 12:32:37 [name],  [name] ([id number]) -------------------------------------------------------------------------------- Wound Assessment Details Patient Name: NEVIA, HENKIN Date of Service: 09/01/2020 10:00 AM Medical Record Number: [id number] Patient Account Number: [id number] Date of Birth/Sex: 04-16-1934 (86 y.o. F) Treating RN: Cornell Barman Primary Care Piya Mesch: Fulton Reek Other Clinician: Referring Larrie Lucia: Fulton Reek Treating Emerald Shor/Extender: Tito Dine in Treatment: 7 Wound Status Wound Number: 1 Primary Diabetic Wound/Ulcer of the Lower Extremity Etiology: Wound Location: Right, Dorsal Foot Wound Status: Open Wounding Event: Blister Comorbid Congestive Heart Failure, Hypertension, Type II Date Acquired: 07/02/2020 History: Diabetes, Received Radiation Weeks Of Treatment: 7 Clustered Wound: No Photos Wound Measurements Length: (cm) 4.2 Width: (cm) 3.3 Depth: (cm) 0.4 Area: (cm) 10.886 Volume: (cm) 4.354 % Reduction in Area: 58% % Reduction in Volume: -68% Epithelialization: None Tunneling: No Undermining: No Wound Description Classification: Grade 2 Wound Margin: Flat and Intact Exudate Amount: Large Exudate Type: Serosanguineous Exudate Color: red, brown Foul Odor After Cleansing: No Slough/Fibrino Yes Wound Bed Granulation Amount: Large (67-100%) Exposed Structure Granulation Quality: Pink, Hyper-granulation Fascia Exposed: No Necrotic Amount: Small (1-33%) Fat Layer (Subcutaneous Tissue) Exposed: Yes Necrotic Quality: Adherent Slough Tendon Exposed: No Muscle  Exposed: No Joint Exposed: No Bone Exposed: No Treatment Notes Wound #1 (Right, Dorsal Foot) Notes Hydrofera blue, ABD, 3 layer bilateral for swelling Electronic Signature(s) Signed: 09/03/2020 4:55:43 PM By: Gretta Weiss, BSN, RN, CWS, Kim RN, BSN [name], Sarah T. ([id number]) [name], BSN, RN, CWS, Kim on 09/01/2020 10:36:46 [name] ([id number]) -------------------------------------------------------------------------------- Wound Assessment Details [name] Date of Service: 09/01/2020 10:00 AM Medical Record Number: [id number] Patient Account Number: [id number] Date of Birth/Sex: Sep 18, 1934 (86 y.o. F) Treating RN: Cornell Barman Primary Care Talyn Eddie: Fulton Reek Other Clinician: Referring Miasha Emmons: Fulton Reek Treating Leola Fiore/Extender: Tito Dine in Treatment: 7 Wound Status Wound Number: 2 Primary Skin Tear Etiology: Wound Location: Right, Anterior Lower Leg Wound Status: Open Wounding Event: Skin Tear/Laceration Comorbid Congestive Heart Failure, Hypertension, Type II Date Acquired: 08/30/2020 History: Diabetes, Received Radiation Weeks Of Treatment: 0 Clustered Wound: No Photos Wound Measurements Length: (cm) 2 Width: (cm) 1 Depth: (cm) 0.1 Area: (cm) 1.571 Volume: (cm) 0.157 % Reduction in Area: 0% % Reduction in Volume: 0% Epithelialization: None Tunneling: No Undermining: No Wound Description Classification: Full Thickness Without Exposed Support Structures Exudate Amount: Large Exudate Type: Sanguinous Exudate Color: red Foul Odor After Cleansing: No Slough/Fibrino No Wound Bed Granulation Amount: Large (67-100%) Exposed Structure Granulation Quality: Red Fascia Exposed: No Fat Layer (Subcutaneous Tissue) Exposed: Yes Tendon Exposed: No Muscle Exposed: No Joint Exposed: No Bone Exposed: No Treatment Notes Wound #2 (Right, Anterior Lower Leg) Notes Hydrofera blue, ABD, 3 layer  bilateral for swelling Electronic Signature(s) Signed: 09/03/2020 4:55:43 PM By: Gretta Weiss, BSN, RN, CWS, Kim RN, BSN [name], Sarah T. ([id number]) [name], BSN, RN, CWS, Kim on 09/01/2020 10:37:28 [name] ([id number]) -------------------------------------------------------------------------------- Wound Assessment Details [name] Date of Service: 09/01/2020 10:00 AM Medical Record Number: [id number] Patient Account Number: [id number] Date of Birth/Sex: 05-06-1934 (86 y.o. F) Treating RN: Cornell Barman Primary Care Anissia Wessells: Fulton Reek Other Clinician: Referring Rodnesha Elie: Fulton Reek Treating Rio Taber/Extender: Tito Dine in Treatment: 7 Wound Status Wound Number: 3 Primary Skin Tear Etiology: Wound Location: Right, Lateral Lower Leg Wound Status: Open Wounding Event: Skin Tear/Laceration Comorbid Congestive Heart Failure, Hypertension, Type II Date Acquired: 08/30/2020 History: Diabetes, Received Radiation Weeks Of Treatment: 0 Clustered Wound: No Photos Wound Measurements Length: (cm) 4.5 Width: (cm) 2.7 Depth: (cm) 0.1  Area: (cm) 9.543 Volume: (cm) 0.954 % Reduction in Area: 0% % Reduction in Volume: 0% Epithelialization: None Tunneling: No Undermining: No Wound Description Classification: Full Thickness Without Exposed Support Structu res Wound Bed Granulation Amount: Large (67-100%) Exposed Structure Granulation Quality: Red Fascia Exposed: No Fat Layer (Subcutaneous Tissue) Exposed: Yes Tendon Exposed: No Muscle Exposed: No Joint Exposed: No Bone Exposed: No Treatment Notes Wound #3 (Right, Lateral Lower Leg) Notes Hydrofera blue, ABD, 3 layer bilateral for swelling Electronic Signature(s) Signed: 09/03/2020 4:55:43 PM By: Gretta Weiss, BSN, RN, CWS, Kim RN, BSN [name], BSN, RN, CWS, Kim on 09/01/2020 10:36:01 [name]  ([id number]) -------------------------------------------------------------------------------- Wound Assessment Details [name] Date of Service: 09/01/2020 10:00 AM Medical Record Number: [id number] Patient Account Number: [id number] Date of Birth/Sex: May 31, 1934 (86 y.o. F) Treating RN: Cornell Barman Primary Care Majed Pellegrin: Fulton Reek Other Clinician: Referring Melitta Tigue: Fulton Reek Treating Timon Geissinger/Extender: Tito Dine in Treatment: 7 Wound Status Wound Number: 4 Primary Skin Tear Etiology: Wound Location: Left, Medial Foot Wound Status: Open Wounding Event: Skin Tear/Laceration Comorbid Congestive Heart Failure, Hypertension, Type II Date Acquired: 08/30/2020 History: Diabetes, Received Radiation Weeks Of Treatment: 0 Clustered Wound: No Photos Wound Measurements Length: (cm) 0.1 Width: (cm) 0.9 Depth: (cm) 0.1 Area: (cm) 0.071 Volume: (cm) 0.007 % Reduction in Area: 0% % Reduction in Volume: 0% Epithelialization: None Tunneling: No Undermining: No Wound Description Classification: Full Thickness Without Exposed Support Structures Exudate Amount: Medium Exudate Type: Serosanguineous Exudate Color: red, brown Foul Odor After Cleansing: No Slough/Fibrino No Wound Bed Granulation Amount: Large (67-100%) Exposed Structure Granulation Quality: Red Fascia Exposed: No Necrotic Amount: None Present (0%) Fat Layer (Subcutaneous Tissue) Exposed: Yes Tendon Exposed: No Muscle Exposed: No Joint Exposed: No Bone Exposed: No Treatment Notes Wound #4 (Left, Medial Foot) Notes Hydrofera blue, ABD, 3 layer bilateral for swelling Electronic Signature(s) Signed: 09/03/2020 4:55:43 PM By: Gretta Weiss, BSN, RN, CWS, Kim RN, BSN [place], Jullianna T. ([id number]) [name], BSN, RN, CWS, Kim on 09/01/2020 10:35:15 [name] ([id number]) -------------------------------------------------------------------------------- Vitals  Details [name] Date of Service: 09/01/2020 10:00 AM Medical Record Number: [id number] Patient Account Number: [id number] Date of Birth/Sex: [birth year] (86 y.o. F) Treating RN: Cornell Barman Primary Care Adiva Boettner: Fulton Reek Other Clinician: Referring Smita Lesh: Fulton Reek Treating Akeelah Seppala/Extender: Tito Dine in Treatment: 7 Vital Signs Time Taken: 10:00 Temperature (F): 98.4 Height (in): 64 Pulse (bpm): 94 Weight (lbs): 205 Respiratory Rate (breaths/min): 18 Body Mass Index (BMI): 35.2 Blood Pressure (mmHg): 146/74 Reference Range: 80 - 120 mg / dl Electronic Signature(s) Signed: 09/03/2020 4:55:43 PM By: Gretta Weiss, BSN, RN, CWS, Kim RN, BSN [name], BSN, RN, CWS, Kim on 09/01/2020 10:05:01

## 2020-09-08 ENCOUNTER — Encounter: Payer: Medicare Other | Admitting: Internal Medicine

## 2020-09-08 ENCOUNTER — Other Ambulatory Visit: Payer: Self-pay

## 2020-09-08 DIAGNOSIS — E11621 Type 2 diabetes mellitus with foot ulcer: Secondary | ICD-10-CM | POA: Diagnosis not present

## 2020-09-09 ENCOUNTER — Telehealth (INDEPENDENT_AMBULATORY_CARE_PROVIDER_SITE_OTHER): Payer: Self-pay

## 2020-09-09 NOTE — Telephone Encounter (Signed)
Spoke with the patient regarding getting scheduled for a left brachiocephalic AVF after Christmas as was requested. The patient was offered 09/29/20 and 09/30/20 but declined both and stated she wanted to wait until after the new year to schedule her surgery if she decides to have surgery.

## 2020-09-10 NOTE — Progress Notes (Signed)
NERIYAH, CERCONE ([id number]) Visit Report for 09/08/2020 HPI Details [name], [name] Date of Service: 09/08/2020 10:45 AM Medical Record Number: [id number] Patient Account Number: [id number] Date of Birth/Sex: 01-03-1934 (86 y.o. F) Treating RN: Cornell Barman Primary Care Provider: Fulton Reek Other Clinician: Referring Provider: Fulton Reek Treating Provider/Extender: Tito Dine in Treatment: 8 History of Present Illness HPI Description: ADMISSION 07/14/2020. This is a medically complex 86 year old woman who is sent over urgently from her primary care doctor Dr. Doy Hutching at [facility] and we worked her in this afternoon. She is apparently developed a blister on the dorsal foot sometime over the last 2 weeks on the right which seems to have developed a hemorrhagic component to this. The history here is somewhat vague but this is happened since she left the hospital on 07/02/2020. She also has a blistered area on the left dorsal foot although this does not have a hemorrhagic component to it and it is not threatened to open where is the right side is definitely leaking fluid and could easily break down. I do not believe she has been doing anything specific to this. With regards to her medical history I have reviewed the discharge summary from the hospital from 06/22/2020 through 07/02/2020. 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 She was given prednisone but was not felt to be a candidate for immunosuppression secondary to her age and comorbidities. She is ANA positive p-ANCA positive. She had a right IJ catheter placed on 9/24 and is being done on dialysis. She is a diabetic. She takes prednisone 20 mg twice daily. She was on aspirin and Plavix although I think these have been put on hold. Past medical history includes; pauci-immune crescentic glomerulonephritis.  History of peripheral edema and type 2 diabetes left carotid artery stenosis with a history of a CVA, coronary artery disease and hypertension I cannot see any arterial evaluation in her record in [place] link. We could not do arterial ABIs on her because of complaints of discomfort 10/20; 1 week follow-up. This is a patient who is a diabetic. She recently developed acute renal failure because of pauci-immune glomerulonephritis. Sometime during her initial hospitalization she developed a wound on her right foot tremendous swelling in both legs. She was sent over here urgently last week by her primary care physician with what looks to be hematoma still contained on the right dorsal foot. We put silver alginate on this and put her in compression to control some of the swelling. The left leg had a similar appearance with severe edema in the left dorsal foot so we put that under compression as well. When she arrived back for nurse follow-up on Friday there was some drainage coming out of the wound which our nurse cultured. This is come back showing Pseudomonas Enterococcus faecalis and staph aureus although the identification of the staph aureus is not complete. I empirically put her on cefdinir 300 mg after dialysis on Tuesday Thursday and Saturday she is only had one dose. She has not been systemically unwell 10/27 1 week follow-up. With considerable help of her nursing staff I was finally able to speak to her nephrologist who manages her at dialysis. Started her on vancomycin and ceftazidime. This to cover the combination of methicillin sensitive staph aureus Enterococcus faecalis and Pseudomonas. Although the staph aureus was methicillin sensitive and the Enterococcus ampicillin sensitive vancomycin provided the best alternative here that can be given  at dialysis and ceftazidime to cover the Pseudomonas. She had a traumatic wound on the top of her foot and a complicated abscess. She has  significant tissue destruction from this although the wound is looking a lot better today. We are using silver alginate to the surface. X-ray of the foot did not show any deep obvious infection or osteomyelitis 12/1; since the patient was last here she was hospitalized from 11/3 through 11/10 with coag negative staphylococcal bacteremia. I do not know the the exact cause of this was determined. She was seen by vascular and they replaced her permacath on the right side of her neck. Her repeat blood cultures were negative. She is still on IV vancomycin ando Ceftazidime at dialysis. She generally feels systemically well she does not have a fever she is still receiving dialysis but reports she voids up to 8 times a day She has home health changing the dressing I believe they are using silver alginate under 3 layer compression. In addition to the large wound on her right anterior foot that we initially saw her for she has apparently a right lateral leg wound that she suffered during a fall and 2 skin tears on the left anterior lower leg 12/8; patient comes in looking a lot better this week. One of the 2 skin tears she has on the left leg is healed the other is smaller. The area on the right lateral leg looks improved and her original wound on the right dorsal foot is a lot smaller and looks a lot better. We used Hydrofera Blue to all wound areas under 3 layer compression.. Apparently home health had some trouble with the compression wraps. Electronic Signature(s) Signed: 09/09/2020 7:46:45 PM By: Linton Ham MD Entered By: Linton Ham on 09/08/2020 11:21:05 [name] ([id number]) -------------------------------------------------------------------------------- Physical Exam Details Patient Name: [name] Date of Service: 09/08/2020 10:45 AM Medical Record Number: [id number] Patient Account Number: [id number] Date of Birth/Sex: 13-Jan-1934 (86 y.o. F) Treating RN: Cornell Barman Primary Care Provider: Fulton Reek Other Clinician: Referring Provider: Fulton Reek Treating Provider/Extender: Tito Dine in Treatment: 8 Constitutional Sitting or standing Blood Pressure is within target range for patient.. Pulse regular and within target range for patient.Marland Kitchen Respirations regular, non- labored and within target range.. Temperature is normal and within the target range for the patient.Marland Kitchen appears in no distress. Respiratory Respiratory effort is easy and symmetric bilaterally. Rate is normal at rest and on room air.. Cardiovascular She has palpable dorsalis pedis and posterior tibial pulses on the right although they are certainly not robust nevertheless her foot is warm. We have good edema control. Notes Wound exam; her original wound is on the right dorsal foot which was a hematoma that subsequently became infected. This looks a lot better than last week. Healthy granulation tissue even under illumination no debridement is required oShe has skin tears on the right lateral calf this is better clean wound surface no debridement and on the left medial lower leg also smaller and better looking. One of the areas on the left medial has fully epithelialized. Electronic Signature(s) Signed: 09/09/2020 7:46:45 PM By: Linton Ham MD Entered By: Linton Ham on 09/08/2020 11:23:34 [name] ([id number]) -------------------------------------------------------------------------------- Physician Orders Details Patient Name: [name] Date of Service: 09/08/2020 10:45 AM Medical Record Number: [id number] Patient Account Number: [id number] Date of Birth/Sex: [birth year] (86 y.o. F) Treating RN: Dolan Amen Primary Care Provider: Fulton Reek Other Clinician: Referring Provider: Fulton Reek Treating Provider/Extender: Linton Ham  G Weeks in Treatment: 8 Verbal / Phone Orders: No Diagnosis Coding Wound Cleansing Wound #1  Right,Dorsal Foot o Clean wound with Normal Saline. Wound #2 Left,Anterior Lower Leg o Clean wound with Normal Saline. Wound #3 Right,Lateral Lower Leg o Clean wound with Normal Saline. Anesthetic (add to Medication List) Wound #1 Right,Dorsal Foot o Topical Lidocaine 4% cream applied to wound bed prior to debridement (In Clinic Only). Wound #2 Left,Anterior Lower Leg o Topical Lidocaine 4% cream applied to wound bed prior to debridement (In Clinic Only). Wound #3 Right,Lateral Lower Leg o Topical Lidocaine 4% cream applied to wound bed prior to debridement (In Clinic Only). Primary Wound Dressing Wound #1 Right,Dorsal Foot o Hydrafera Blue Ready Transfer Wound #2 Left,Anterior Lower Leg o Hydrafera Blue Ready Transfer Wound #3 Right,Lateral Lower Leg o Hydrafera Blue Ready Transfer Secondary Dressing Wound #1 Right,Dorsal Foot o ABD pad Wound #2 Left,Anterior Lower Leg o ABD pad Wound #3 Right,Lateral Lower Leg o ABD pad Dressing Change Frequency Wound #1 Right,Dorsal Foot o Change Dressing Monday, Wednesday, Friday Wound #2 Left,Anterior Lower Leg o Change Dressing Monday, Wednesday, Friday Wound #3 Right,Lateral Lower Leg o Change Dressing Monday, Wednesday, Friday Follow-up Appointments Wound #1 Right,Dorsal Foot o Return Appointment in 1 week. Wound #2 Left,Anterior Lower Leg o Return Appointment in 1 week. [name] ([id number]) Wound #3 Right,Lateral Lower Leg o Return Appointment in 1 week. Edema Control Wound #1 Right,Dorsal Foot o 3 Layer Compression System - Bilateral Wound #2 Left,Anterior Lower Leg o 3 Layer Compression System - Bilateral Wound #3 Right,Lateral Lower Leg o 3 Layer Compression System - Bilateral Additional Orders / Instructions Wound #1 Right,Dorsal Foot o Increase protein intake. Wound #2 Left,Anterior Lower Leg o Increase protein intake. Wound #3 Right,Lateral Lower Leg o  Increase protein intake. Home Health Wound #1 [place] [facility] - [facility] o Please direct any NON-WOUND related issues/requests for orders to patient's Primary Care Physician Wound #2 [place] [facility] - [facility] o Please direct any NON-WOUND related issues/requests for orders to patient's Primary Care Physician Wound #3 Right,Lateral Lower Leg o [place] [facility] - [facility] o Please direct any NON-WOUND related issues/requests for orders to patient's Primary Care Physician Notes ****[facility]***** Please call us with any questions or concerns regarding dressings Electronic Signature(s) Signed: 09/09/2020 4:55:46 PM By: Charlett Nose Signed: 09/09/2020 7:46:45 PM By: Linton Ham MD Entered By: Georges Mouse, Minus Breeding on 09/08/2020 12:35:23 [name] ([id number]) -------------------------------------------------------------------------------- Problem List Details Patient Name: [name] Date of Service: 09/08/2020 10:45 AM Medical Record Number: [id number] Patient Account Number: [id number] Date of Birth/Sex: 1934-04-24 (86 y.o. 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 07/21/2020 No Yes muscle L97.811 Non-pressure chronic ulcer of other part of right lower leg limited to 09/01/2020 No Yes breakdown of skin S80.12XD Contusion of left lower leg, subsequent encounter 09/01/2020 No Yes E11.22 Type 2 diabetes mellitus with diabetic chronic kidney disease 07/14/2020 No Yes N18.5 Chronic kidney disease,  stage 5 07/14/2020 No Yes Inactive Problems ICD-10 Code Description Active Date Inactive Date S90.822D Blister (nonthermal), left foot, subsequent encounter 07/14/2020 07/14/2020 Resolved Problems Electronic Signature(s) Signed: 09/09/2020 7:46:45  PM By: Linton Ham MD Entered By: Linton Ham on 09/08/2020 11:19:49 [name] ([id number]) -------------------------------------------------------------------------------- Progress Note Details Patient Name: [name] Date of Service: 09/08/2020 10:45 AM Medical Record Number: [id number] Patient Account Number: [id number] Date of Birth/Sex: August 28, 1934 (86 y.o. F) Treating RN: Cornell Barman Primary Care Provider: Fulton Reek Other Clinician: Referring Provider: Fulton Reek Treating Provider/Extender: Tito Dine in Treatment: 8 Subjective History of Present Illness (HPI) ADMISSION 07/14/2020. This is a medically complex 86 year old woman who is sent over urgently from her primary care doctor Dr. Doy Hutching at [place] [facility] and we worked her in this afternoon. She is apparently developed a blister on the dorsal foot sometime over the last 2 weeks on the right which seems to have developed a hemorrhagic component to this. The history here is somewhat vague but this is happened since she left the hospital on 07/02/2020. She also has a blistered area on the left dorsal foot although this does not have a hemorrhagic component to it and it is not threatened to open where is the right side is definitely leaking fluid and could easily break down. I do not believe she has been doing anything specific to this. With regards to her medical history I have reviewed the discharge summary from the hospital from 06/22/2020 through 07/02/2020. 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 She was given prednisone but was not felt to be a  candidate for immunosuppression secondary to her age and comorbidities. She is ANA positive p-ANCA positive. She had a right IJ catheter placed on 9/24 and is being done on dialysis. She is a diabetic. She takes prednisone 20 mg twice daily. She was on aspirin and Plavix although I think these have been put on hold. Past medical history includes; pauci-immune crescentic glomerulonephritis. History of peripheral edema and type 2 diabetes left carotid artery stenosis with a history of a CVA, coronary artery disease and hypertension I cannot see any arterial evaluation in her record in [place] link. We could not do arterial ABIs on her because of complaints of discomfort 10/20; 1 week follow-up. This is a patient who is a diabetic. She recently developed acute renal failure because of pauci-immune glomerulonephritis. Sometime during her initial hospitalization she developed a wound on her right foot tremendous swelling in both legs. She was sent over here urgently last week by her primary care physician with what looks to be hematoma still contained on the right dorsal foot. We put silver alginate on this and put her in compression to control some of the swelling. The left leg had a similar appearance with severe edema in the left dorsal foot so we put that under compression as well. When she arrived back for nurse follow-up on Friday there was some drainage coming out of the wound which our nurse cultured. This is come back showing Pseudomonas Enterococcus faecalis and staph aureus although the identification of the staph aureus is not complete. I empirically put her on cefdinir 300 mg after dialysis on Tuesday Thursday and Saturday she is only had one dose. She has not been systemically unwell 10/27 1 week follow-up. With considerable help of her nursing staff I was finally able to speak to her nephrologist who manages her at dialysis. Started her on vancomycin and ceftazidime. This to cover the  combination of methicillin sensitive staph aureus Enterococcus faecalis and Pseudomonas. Although the staph aureus was methicillin sensitive and the Enterococcus ampicillin  sensitive vancomycin provided the best alternative here that can be given at dialysis and ceftazidime to cover the Pseudomonas. She had a traumatic wound on the top of her foot and a complicated abscess. She has significant tissue destruction from this although the wound is looking a lot better today. We are using silver alginate to the surface. X-ray of the foot did not show any deep obvious infection or osteomyelitis 12/1; since the patient was last here she was hospitalized from 11/3 through 11/10 with coag negative staphylococcal bacteremia. I do not know the the exact cause of this was determined. She was seen by vascular and they replaced her permacath on the right side of her neck. Her repeat blood cultures were negative. She is still on IV vancomycin ando Ceftazidime at dialysis. She generally feels systemically well she does not have a fever she is still receiving dialysis but reports she voids up to 8 times a day She has home health changing the dressing I believe they are using silver alginate under 3 layer compression. In addition to the large wound on her right anterior foot that we initially saw her for she has apparently a right lateral leg wound that she suffered during a fall and 2 skin tears on the left anterior lower leg 12/8; patient comes in looking a lot better this week. One of the 2 skin tears she has on the left leg is healed the other is smaller. The area on the right lateral leg looks improved and her original wound on the right dorsal foot is a lot smaller and looks a lot better. We used Hydrofera Blue to all wound areas under 3 layer compression.. Apparently home health had some trouble with the compression wraps. Objective Constitutional Sitting or standing Blood Pressure is within target range for  patient.. Pulse regular and within target range for patient.Marland Kitchen Respirations regular, non- labored and within target range.. Temperature is normal and within the target range for the patient.Marland Kitchen appears in no distress. Vitals Time Taken: 10:42 AM, Height: 64 in, Weight: 205 lbs, BMI: 35.2, Temperature: 98.1 F, Pulse: 97 bpm, Respiratory Rate: 18 breaths/min, Blood Pressure: 121/66 mmHg. [name] ([id number]) Respiratory Respiratory effort is easy and symmetric bilaterally. Rate is normal at rest and on room air.. Cardiovascular She has palpable dorsalis pedis and posterior tibial pulses on the right although they are certainly not robust nevertheless her foot is warm. We have good edema control. General Notes: Wound exam; her original wound is on the right dorsal foot which was a hematoma that subsequently became infected. This looks a lot better than last week. Healthy granulation tissue even under illumination no debridement is required She has skin tears on the right lateral calf this is better clean wound surface no debridement and on the left medial lower leg also smaller and better looking. One of the areas on the left medial has fully epithelialized. Integumentary (Hair, Skin) Wound #1 status is Open. Original cause of wound was Blister. The wound is located on the Right,Dorsal Foot. The wound measures 2cm length x 3cm width x 0.2cm depth; 4.712cm^2 area and 0.942cm^3 volume. There is Fat Layer (Subcutaneous Tissue) exposed. There is no tunneling or undermining noted. There is a large amount of serosanguineous drainage noted. The wound margin is flat and intact. There is large (67-100%) pink, hyper - granulation within the wound bed. There is a small (1-33%) amount of necrotic tissue within the wound bed including Adherent Slough. Wound #2 status is Open. Original  cause of wound was Skin Tear/Laceration. The wound is located on the Left,Anterior Lower Leg. The wound measures 1.9cm  length x 1.2cm width x 0.1cm depth; 1.791cm^2 area and 0.179cm^3 volume. There is Fat Layer (Subcutaneous Tissue) exposed. There is no tunneling or undermining noted. There is a medium amount of serosanguineous drainage noted. There is large (67-100%) red granulation within the wound bed. There is no necrotic tissue within the wound bed. Wound #3 status is Open. Original cause of wound was Skin Tear/Laceration. The wound is located on the Right,Lateral Lower Leg. The wound measures 2cm length x 2.4cm width x 0.1cm depth; 3.77cm^2 area and 0.377cm^3 volume. There is Fat Layer (Subcutaneous Tissue) exposed. There is no tunneling or undermining noted. There is a large amount of serosanguineous drainage noted. There is large (67-100%) red, hyper - granulation within the wound bed. There is no necrotic tissue within the wound bed. Wound #4 status is Healed - Epithelialized. Original cause of wound was Skin Tear/Laceration. The wound is located on the Left,Medial Foot. The wound measures 0cm length x 0cm width x 0cm depth; 0cm^2 area and 0cm^3 volume. 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 Wound #2 Left,Anterior Lower Leg: Clean wound with Normal Saline. Wound #3 Right,Lateral Lower Leg: Clean wound with Normal Saline. Anesthetic (add to Medication List): Wound #1 Right,Dorsal Foot: Topical Lidocaine 4% cream applied to wound bed prior to debridement (In Clinic Only). Wound #2 Left,Anterior Lower Leg: Topical Lidocaine 4% cream applied to wound bed prior to  debridement (In Clinic Only). Wound #3 Right,Lateral Lower Leg: Topical Lidocaine 4% cream applied to wound bed prior to debridement (In Clinic Only). Primary Wound Dressing: Wound #1 Right,Dorsal Foot: Hydrafera Blue Ready Transfer Wound #2 Left,Anterior Lower Leg: Hydrafera Blue Ready Transfer Wound #3 Right,Lateral Lower Leg: RUBLE, BUTTLER ([id number]) Hydrafera Blue Ready Transfer Secondary Dressing: Wound #1 Right,Dorsal Foot: ABD pad Wound #2 Left,Anterior Lower Leg: ABD pad Wound #3 Right,Lateral Lower Leg: ABD pad Dressing Change Frequency: Wound #1 Right,Dorsal Foot: Change Dressing Monday, Wednesday, Friday Wound #2 Left,Anterior Lower Leg: Change Dressing Monday, Wednesday, Friday Wound #3 Right,Lateral Lower Leg: Change Dressing Monday, Wednesday, Friday Follow-up Appointments: Wound #1 Right,Dorsal Foot: Return Appointment in 1 week. Wound #2 Left,Anterior Lower Leg: Return Appointment in 1 week. Wound #3 Right,Lateral Lower Leg: Return Appointment in 1 week. Edema Control: Wound #1 Right,Dorsal Foot: 3 Layer Compression System - Bilateral Wound #2 Left,Anterior Lower Leg: 3 Layer Compression System - Bilateral Wound #3 Right,Lateral Lower Leg: 3 Layer Compression System - Bilateral Additional Orders / Instructions: Wound #1 Right,Dorsal Foot: Increase protein intake. Wound #2 Left,Anterior Lower Leg: Increase protein intake. Wound #3 Right,Lateral Lower Leg: Increase protein intake. Home Health: Wound #1 Right,Dorsal Foot: [place] [facility] Please direct any NON-WOUND related issues/requests for orders to patient's Primary Care Physician Wound #2 Left,Anterior Lower Leg: [place] [facility] Please direct any NON-WOUND related issues/requests for orders to patient's Primary Care Physician Wound #3 Right,Lateral Lower Leg: [place] [facility] Please direct any NON-WOUND related  issues/requests for orders to patient's Primary Care Physician 1.  I continued with Hydrofera Blue to all open areas under 3 layer compression bilaterally 2. Everything looked better this week. I had some thoughts about having to debride the right foot last week however this looked better as well. No debridement was necessary 3. Her edema control is good. No additional compression is necessary. 4. No evidence of infection in any wound area Electronic Signature(s) Signed: 09/09/2020 7:46:45 PM By: Linton Ham MD Entered By: Linton Ham on 09/08/2020 11:24:36 [name] ([id number]) -------------------------------------------------------------------------------- SuperBill Details Patient Name: [name] Date of Service: 09/08/2020 Medical Record Number: [id number] Patient Account Number: [id number] Date of Birth/Sex: 1934-06-24 (86 y.o. 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

## 2020-09-10 NOTE — Progress Notes (Signed)
LEXIA, VANDEVENDER ([id number]) Visit Report for 09/08/2020 Arrival Information Details [name], [name] Date of Service: 09/08/2020 10:45 AM Medical Record Number: [id number] Patient Account Number: [id number] Date of Birth/Sex: 07/10/1934 (86 y.o. F) Treating RN: Dolan Amen Primary Care Saifullah Jolley: Fulton Reek Other Clinician: Referring Leeya Rusconi: Fulton Reek Treating Shemeika Starzyk/Extender: Tito Dine in Treatment: 8 Visit Information History Since Last Visit Pain Present Now: No Patient Arrived: Wheel Chair Arrival Time: 10:42 Accompanied By: husband Transfer Assistance: None Patient Identification Verified: Yes Secondary Verification Process Completed: Yes Patient Requires Transmission-Based Precautions: No Patient Has Alerts: No Electronic Signature(s) Signed: 09/09/2020 4:55:46 PM By: Georges Mouse, Minus Breeding Entered By: Georges Mouse, Minus Breeding on 09/08/2020 10:42:16 [name] ([id number]) -------------------------------------------------------------------------------- Compression Therapy Details [name] Date of Service: 09/08/2020 10:45 AM Medical Record Number: [id number] Patient Account Number: [id number] Date of Birth/Sex: Feb 05, 1934 (86 y.o. F) Treating RN: Dolan Amen Primary Care Lorea Kupfer: Fulton Reek Other Clinician: Referring Marshaun Lortie: Fulton Reek Treating Nidya Bouyer/Extender: Tito Dine in Treatment: 8 Compression Therapy Performed for Wound Assessment: Non-Wound Location Performed By: Cora Daniels, RN Compression Type: Three Layer Location: Lower Extremity, Bilateral Post Procedure Diagnosis Same as Pre-procedure Electronic Signature(s) Signed: 09/09/2020 4:55:46 PM By: Georges Mouse, Kenia Entered By: Georges Mouse, Minus Breeding on 09/08/2020 11:44:40 [name]  ([id number]) -------------------------------------------------------------------------------- Encounter Discharge Information Details [name] Date of Service: 09/08/2020 10:45 AM Medical Record Number: [id number] Patient Account Number: [id number] Date of Birth/Sex: 06-Apr-1934 (86 y.o. F) Treating RN: Dolan Amen Primary Care Hiral Lukasiewicz: Fulton Reek Other Clinician: Referring Elleen Coulibaly: Fulton Reek Treating [name]/Extender: Tito Dine in Treatment: 8 Encounter Discharge Information Items Discharge Condition: Stable Ambulatory Status: Wheelchair Discharge Destination: Home Transportation: Private Auto Accompanied By: husband Schedule Follow-up Appointment: Yes Clinical Summary of Care: Electronic Signature(s) Signed: 09/09/2020 4:55:46 PM By: Georges Mouse, Minus Breeding Entered By: Georges Mouse, Minus Breeding on 09/08/2020 11:46:05 [name] ([id number]) -------------------------------------------------------------------------------- Lower Extremity Assessment Details [name] Date of Service: 09/08/2020 10:45 AM Medical Record Number: [id number] Patient Account Number: [id number] Date of Birth/Sex: Jul 07, 1934 (86 y.o. F) Treating RN: Dolan Amen Primary Care Fontaine Hehl: Fulton Reek Other Clinician: Referring Sloane Palmer: Fulton Reek Treating [name]/Extender: Tito Dine in Treatment: 8 Edema Assessment Assessed: [Left: Yes] [Right: Yes] Edema: [Left: Yes] [Right: Yes] Calf Left: Right: Point of Measurement: 31 cm From Medial Instep 37 cm 38.4 cm Ankle Left: Right: Point of Measurement: 11 cm From Medial Instep 23 cm 25 cm Vascular Assessment Pulses: Dorsalis Pedis Doppler Audible: [Left:Yes] [Right:Yes] Electronic Signature(s) Signed: 09/09/2020 4:55:46 PM By: Georges Mouse, Minus Breeding Entered By: Georges Mouse, Minus Breeding on 09/08/2020 11:13:03 [name]  ([id number]) -------------------------------------------------------------------------------- Multi Wound Chart Details [name] Date of Service: 09/08/2020 10:45 AM Medical Record Number: [id number] Patient Account Number: [id number] Date of Birth/Sex: 1933-10-19 (86 y.o. F) Treating RN: Dolan Amen Primary Care Rey Fors: Fulton Reek Other Clinician: Referring Hulan Szumski: Fulton Reek Treating Shulamis Wenberg/Extender: Tito Dine in Treatment: 8 Vital Signs Height(in): 64 Pulse(bpm): 97 Weight(lbs): 205 Blood Pressure(mmHg): 121/66 Body Mass Index(BMI): 35 Temperature(F): 98.1 Respiratory Rate(breaths/min): 18 Photos: Wound Location: Right, Dorsal Foot Left, Anterior Lower Leg Right, Lateral Lower Leg Wounding Event: Blister Skin Tear/Laceration Skin Tear/Laceration Primary Etiology: Diabetic Wound/Ulcer of the Lower Skin Tear Skin Tear Extremity Comorbid History: Congestive Heart Failure, Congestive Heart Failure, Congestive Heart Failure, Hypertension, Type II Diabetes, Hypertension, Type II Diabetes, Hypertension, Type II Diabetes, Received Radiation Received Radiation Received Radiation Date  Acquired: 07/02/2020 08/30/2020 08/30/2020 Weeks of Treatment: 8 1 1  Wound Status: Open Open Open Measurements L x W x D (cm) 2x3x0.2 1.9x1.2x0.1 2x2.4x0.1 Area (cm) : 4.712 1.791 3.77 Volume (cm) : 0.942 0.179 0.377 % Reduction in Area: 81.80% -14.00% 60.50% % Reduction in Volume: 63.70% -14.00% 60.50% Classification: Grade 2 Full Thickness Without Exposed Full Thickness Without Exposed Support Structures Support Structures Exudate Amount: Large Medium Large Exudate Type: Serosanguineous Serosanguineous Serosanguineous Exudate Color: red, brown red, brown red, brown Wound Margin: Flat and Intact N/A N/A Granulation Amount: Large (67-100%) Large (67-100%) Large (67-100%) Granulation Quality: Pink, Hyper-granulation Red Red,  Hyper-granulation Necrotic Amount: Small (1-33%) None Present (0%) None Present (0%) Exposed Structures: Fat Layer (Subcutaneous Tissue): Fat Layer (Subcutaneous Tissue): Fat Layer (Subcutaneous Tissue): Yes Yes Yes Fascia: No Fascia: No Fascia: No Tendon: No Tendon: No Tendon: No Muscle: No Muscle: No Muscle: No Joint: No Joint: No Joint: No Bone: No Bone: No Bone: No Epithelialization: None None None Wound Number: 4 N/A N/A Photos: No Photos N/A N/A Wound Location: Left, Medial Foot N/A N/A Wounding Event: Skin Tear/Laceration N/A N/A Primary Etiology: Skin Tear N/A N/A Comorbid History: N/A N/A N/A Date Acquired: 08/30/2020 N/A N/A Weeks of Treatment: 1 N/A N/A Wound Status: Healed - Epithelialized N/A N/A ANELLY, SAMARIN T. ([id number]) Measurements L x W x D (cm) 0x0x0 N/A N/A Area (cm) : 0 N/A N/A Volume (cm) : 0 N/A N/A % Reduction in Area: 100.00% N/A N/A % Reduction in Volume: 100.00% N/A N/A Classification: Full Thickness Without Exposed N/A N/A Support Structures Exudate Amount: N/A N/A N/A Exudate Type: N/A N/A N/A Exudate Color: N/A N/A N/A Wound Margin: N/A N/A N/A Granulation Amount: N/A N/A N/A Granulation Quality: N/A N/A N/A Necrotic Amount: N/A N/A N/A Exposed Structures: N/A N/A N/A Epithelialization: N/A N/A N/A Treatment Notes Electronic Signature(s) Signed: 09/09/2020 7:46:45 PM By: Linton Ham MD Entered By: Linton Ham on 09/08/2020 11:19:58 [name] ([id number]) -------------------------------------------------------------------------------- Multi-Disciplinary Care Plan Details [name] Date of Service: 09/08/2020 10:45 AM Medical Record Number: [id number] Patient Account Number: [id number] Date of Birth/Sex: 02-25-1934 (86 y.o. F) Treating RN: Dolan Amen Primary Care Armen Waring: Fulton Reek Other Clinician: Referring Mykenna Viele: Fulton Reek Treating Raidon Swanner/Extender: Tito Dine in Treatment: 8 Active Inactive Necrotic Tissue Nursing Diagnoses: Impaired tissue integrity related to necrotic/devitalized tissue Goals: Necrotic/devitalized tissue will be minimized in the wound bed Date Initiated: 07/14/2020 Target Resolution Date: 08/14/2020 Goal Status: Active Interventions: Assess patient pain level pre-, during and post procedure and prior to discharge Treatment Activities: Apply topical anesthetic as ordered : 07/14/2020 Notes: Orientation to the Wound Care Program Nursing Diagnoses: Knowledge deficit related to the wound healing center program Goals: Patient/caregiver will verbalize understanding of the [place] Date Initiated: 07/14/2020 Target Resolution Date: 08/13/2020 Goal Status: Active Interventions: Provide education on orientation to the wound center Notes: Wound/Skin Impairment Nursing Diagnoses: Impaired tissue integrity Goals: Patient/caregiver will verbalize understanding of skin care regimen Date Initiated: 07/14/2020 Target Resolution Date: 07/14/2020 Goal Status: Active Interventions: Assess ulceration(s) every visit Treatment Activities: Topical wound management initiated : 07/14/2020 Notes: Electronic Signature(s) Signed: 09/09/2020 4:55:46 PM By: Georges Mouse, Minus Breeding Entered By: Georges Mouse, Minus Breeding on 09/08/2020 11:14:53 [name] ([id number]) Andres Labrum, Kathi Der ([id number]) -------------------------------------------------------------------------------- Pain Assessment Details [name] Date of Service: 09/08/2020 10:45 AM Medical Record Number: [id number] Patient Account Number: [id number] Date of Birth/Sex: [birth year] (86 y.o. F) Treating RN: Dolan Amen Primary Care Jeydi Klingel:  Fulton Reek Other Clinician: Referring Hartley Wyke: Fulton Reek Treating Ashaad Gaertner/Extender: Tito Dine in Treatment: 8 Active Problems Location of  Pain Severity and Description of Pain Patient Has Paino No Site Locations Rate the pain. Current Pain Level: 0 Pain Management and Medication Current Pain Management: Electronic Signature(s) Signed: 09/09/2020 4:55:46 PM By: Georges Mouse, Minus Breeding Entered By: Georges Mouse, Minus Breeding on 09/08/2020 10:44:24 [name] ([id number]) -------------------------------------------------------------------------------- Patient/Caregiver Education Details [name] Date of Service: 09/08/2020 10:45 AM Medical Record Number: [id number] Patient Account Number: [id number] Date of Birth/Gender: 1934/09/06 (86 y.o. F) Treating RN: Dolan Amen Primary Care Physician: Fulton Reek Other Clinician: Referring Physician: Fulton Reek Treating Physician/Extender: Tito Dine in Treatment: 8 Education Assessment Education Provided To: Patient Education Topics Provided Wound/Skin Impairment: Methods: Explain/Verbal Responses: State content correctly Electronic Signature(s) Signed: 09/09/2020 4:55:46 PM By: Georges Mouse, Minus Breeding Entered By: Georges Mouse, Minus Breeding on 09/08/2020 11:45:09 [name] ([id number]) -------------------------------------------------------------------------------- Wound Assessment Details [name] Date of Service: 09/08/2020 10:45 AM Medical Record Number: [id number] Patient Account Number: [id number] Date of Birth/Sex: March 20, 1934 (86 y.o. F) Treating RN: Dolan Amen Primary Care Veverly Larimer: Fulton Reek Other Clinician: Referring Hilari Wethington: Fulton Reek Treating Elisha Cooksey/Extender: Tito Dine in Treatment: 8 Wound Status Wound Number: 1 Primary Diabetic Wound/Ulcer of the Lower Extremity Etiology: Wound Location: Right, Dorsal Foot Wound Status: Open Wounding Event: Blister Comorbid Congestive Heart Failure, Hypertension, Type II Date Acquired: 07/02/2020 History:  Diabetes, Received Radiation Weeks Of Treatment: 8 Clustered Wound: No Photos Wound Measurements Length: (cm) 2 % R Width: (cm) 3 % R Depth: (cm) 0.2 Epi Area: (cm) 4.712 Tu Volume: (cm) 0.942 Un eduction in Area: 81.8% eduction in Volume: 63.7% thelialization: None nneling: No dermining: No Wound Description Classification: Grade 2 Fo Wound Margin: Flat and Intact Sl Exudate Amount: Large Exudate Type: Serosanguineous Exudate Color: red, brown ul Odor After Cleansing: No ough/Fibrino Yes Wound Bed Granulation Amount: Large (67-100%) Exposed Structure Granulation Quality: Pink, Hyper-granulation Fascia Exposed: No Necrotic Amount: Small (1-33%) Fat Layer (Subcutaneous Tissue) Exposed: Yes Necrotic Quality: Adherent Slough Tendon Exposed: No Muscle Exposed: No Joint Exposed: No Bone Exposed: No Treatment Notes Wound #1 (Right, Dorsal Foot) Notes H.blue, ABD, 3LB Electronic Signature(s) Signed: 09/09/2020 4:55:46 PM By: Wilber Bihari, Karstyn T. ([id number]) Entered By: Georges Mouse, Minus Breeding on 09/08/2020 11:06:58 [name] ([id number]) -------------------------------------------------------------------------------- Wound Assessment Details [name] Date of Service: 09/08/2020 10:45 AM Medical Record Number: [id number] Patient Account Number: [id number] Date of Birth/Sex: 1934-01-09 (86 y.o. F) Treating RN: Dolan Amen Primary Care Rane Blitch: Fulton Reek Other Clinician: Referring Shainna Faux: Fulton Reek Treating Lathon Adan/Extender: Tito Dine in Treatment: 8 Wound Status Wound Number: 2 Primary Skin Tear Etiology: Wound Location: Left, Anterior Lower Leg Wound Status: Open Wounding Event: Skin Tear/Laceration Comorbid Congestive Heart Failure, Hypertension, Type II Date Acquired: 08/30/2020 History: Diabetes, Received Radiation Weeks Of Treatment: 1 Clustered Wound: No Photos Wound  Measurements Length: (cm) 1.9 Width: (cm) 1.2 Depth: (cm) 0.1 Area: (cm) 1.791 Volume: (cm) 0.179 % Reduction in Area: -14% % Reduction in Volume: -14% Epithelialization: None Tunneling: No Undermining: No Wound Description Classification: Full Thickness Without Exposed Support Structures Exudate Amount: Medium Exudate Type: Serosanguineous Exudate Color: red, brown Foul Odor After Cleansing: No Slough/Fibrino No Wound Bed Granulation Amount: Large (67-100%) Exposed Structure Granulation Quality: Red Fascia Exposed: No Necrotic Amount: None Present (0%) Fat Layer (Subcutaneous Tissue) Exposed: Yes Tendon Exposed: No Muscle Exposed: No Joint Exposed:  No Bone Exposed: No Treatment Notes Wound #2 (Left, Anterior Lower Leg) Notes H.blue, ABD, 3LB Electronic Signature(s) Signed: 09/09/2020 4:55:46 PM By: Georges Mouse, Amie [place], Irys T. ([id number]) Entered By: Georges Mouse, Minus Breeding on 09/08/2020 11:08:05 [name] ([id number]) -------------------------------------------------------------------------------- Wound Assessment Details [name] Date of Service: 09/08/2020 10:45 AM Medical Record Number: [id number] Patient Account Number: [id number] Date of Birth/Sex: 03-08-1934 (86 y.o. F) Treating RN: Dolan Amen Primary Care Cidney Kirkwood: Fulton Reek Other Clinician: Referring Mack Thurmon: Fulton Reek Treating Vila Dory/Extender: Tito Dine in Treatment: 8 Wound Status Wound Number: 3 Primary Skin Tear Etiology: Wound Location: Right, Lateral Lower Leg Wound Status: Open Wounding Event: Skin Tear/Laceration Comorbid Congestive Heart Failure, Hypertension, Type II Date Acquired: 08/30/2020 History: Diabetes, Received Radiation Weeks Of Treatment: 1 Clustered Wound: No Photos Wound Measurements Length: (cm) 2 Width: (cm) 2.4 Depth: (cm) 0.1 Area: (cm) 3.77 Volume: (cm) 0.377 % Reduction in Area:  60.5% % Reduction in Volume: 60.5% Epithelialization: None Tunneling: No Undermining: No Wound Description Classification: Full Thickness Without Exposed Support Structures Exudate Amount: Large Exudate Type: Serosanguineous Exudate Color: red, brown Foul Odor After Cleansing: No Slough/Fibrino No Wound Bed Granulation Amount: Large (67-100%) Exposed Structure Granulation Quality: Red, Hyper-granulation Fascia Exposed: No Necrotic Amount: None Present (0%) Fat Layer (Subcutaneous Tissue) Exposed: Yes Tendon Exposed: No Muscle Exposed: No Joint Exposed: No Bone Exposed: No Treatment Notes Wound #3 (Right, Lateral Lower Leg) Notes H.blue, ABD, 3LB Electronic Signature(s) Signed: 09/09/2020 4:55:46 PM By: Wilber Bihari, [place] T. ([id number]) Entered By: Georges Mouse, Minus Breeding on 09/08/2020 11:08:45 [name] ([id number]) -------------------------------------------------------------------------------- Wound Assessment Details [name] Date of Service: 09/08/2020 10:45 AM Medical Record Number: [id number] Patient Account Number: [id number] Date of Birth/Sex: 1934-08-28 (86 y.o. F) Treating RN: Dolan Amen Primary Care Maclaine Ahola: Fulton Reek Other Clinician: Referring Carlus Stay: Fulton Reek Treating Jacqulyne Gladue/Extender: Tito Dine in Treatment: 8 Wound Status Wound Number: 4 Primary Etiology: Skin Tear Wound Location: Left, Medial Foot Wound Status: Healed - Epithelialized Wounding Event: Skin Tear/Laceration Date Acquired: 08/30/2020 Weeks Of Treatment: 1 Clustered Wound: No Photos Photo Uploaded By: Georges Mouse, Minus Breeding on 09/09/2020 09:53:23 Wound Measurements Length: (cm) Width: (cm) Depth: (cm) Area: (cm) Volume: (cm) 0 % Reduction in Area: 100% 0 % Reduction in Volume: 100% 0 0 0 Wound Description Classification: Full Thickness Without Exposed Support Structu res Electronic  Signature(s) Signed: 09/09/2020 4:55:46 PM By: Georges Mouse, Minus Breeding Entered By: Georges Mouse, Minus Breeding on 09/08/2020 11:00:13 [name] ([id number]) -------------------------------------------------------------------------------- Vitals Details [name] Date of Service: 09/08/2020 10:45 AM Medical Record Number: [id number] Patient Account Number: [id number] Date of Birth/Sex: 25-May-1934 (86 y.o. F) Treating RN: Dolan Amen Primary Care Malyssa Maris: Fulton Reek Other Clinician: Referring Anushree Dorsi: Fulton Reek Treating Maurisha Mongeau/Extender: Tito Dine in Treatment: 8 Vital Signs Time Taken: 10:42 Temperature (F): 98.1 Height (in): 64 Pulse (bpm): 97 Weight (lbs): 205 Respiratory Rate (breaths/min): 18 Body Mass Index (BMI): 35.2 Blood Pressure (mmHg): 121/66 Reference Range: 80 - 120 mg / dl Electronic Signature(s) Signed: 09/09/2020 4:55:46 PM By: Georges Mouse, Minus Breeding Entered By: Georges Mouse, Minus Breeding on 09/08/2020 10:44:18

## 2020-09-15 ENCOUNTER — Encounter: Payer: Medicare Other | Admitting: Internal Medicine

## 2020-09-15 ENCOUNTER — Other Ambulatory Visit: Payer: Self-pay

## 2020-09-15 DIAGNOSIS — E11621 Type 2 diabetes mellitus with foot ulcer: Secondary | ICD-10-CM | POA: Diagnosis not present

## 2020-09-17 NOTE — Progress Notes (Signed)
ZAHARA, REMBERT ([id number]) Visit Report for 09/15/2020 HPI Details [name], [name] Date of Service: 09/15/2020 10:45 AM Medical Record Number: [id number] Patient Account Number: [id number] Date of Birth/Sex: 1933/12/05 (86 y.o. F) Treating RN: Cornell Barman Primary Care Provider: Fulton Reek Other Clinician: Referring Provider: Fulton Reek Treating Provider/Extender: Tito Dine in Treatment: 9 History of Present Illness HPI Description: ADMISSION 07/14/2020. This is a medically complex 86 year old woman who is sent over urgently from her primary care doctor Dr. Doy Hutching at [facility] and we worked her in this afternoon. She is apparently developed a blister on the dorsal foot sometime over the last 2 weeks on the right which seems to have developed a hemorrhagic component to this. The history here is somewhat vague but this is happened since she left the hospital on 07/02/2020. She also has a blistered area on the left dorsal foot although this does not have a hemorrhagic component to it and it is not threatened to open where is the right side is definitely leaking fluid and could easily break down. I do not believe she has been doing anything specific to this. With regards to her medical history I have reviewed the discharge summary from the hospital from 06/22/2020 through 07/02/2020. 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 She was given prednisone but was not felt to be a candidate for immunosuppression secondary to her age and comorbidities. She is ANA positive p-ANCA positive. She had a right IJ catheter placed on 9/24 and is being done on dialysis. She is a diabetic. She takes prednisone 20 mg twice daily. She was on aspirin and Plavix although I think these have been put on hold. Past medical history includes; pauci-immune crescentic glomerulonephritis.  History of peripheral edema and type 2 diabetes left carotid artery stenosis with a history of a CVA, coronary artery disease and hypertension I cannot see any arterial evaluation in her record in [facility]. We could not do arterial ABIs on her because of complaints of discomfort 10/20; 1 week follow-up. This is a patient who is a diabetic. She recently developed acute renal failure because of pauci-immune glomerulonephritis. Sometime during her initial hospitalization she developed a wound on her right foot tremendous swelling in both legs. She was sent over here urgently last week by her primary care physician with what looks to be hematoma still contained on the right dorsal foot. We put silver alginate on this and put her in compression to control some of the swelling. The left leg had a similar appearance with severe edema in the left dorsal foot so we put that under compression as well. When she arrived back for nurse follow-up on Friday there was some drainage coming out of the wound which our nurse cultured. This is come back showing Pseudomonas Enterococcus faecalis and staph aureus although the identification of the staph aureus is not complete. I empirically put her on cefdinir 300 mg after dialysis on Tuesday Thursday and Saturday she is only had one dose. She has not been systemically unwell 10/27 1 week follow-up. With considerable help of her nursing staff I was finally able to speak to her nephrologist who manages her at dialysis. Started her on vancomycin and ceftazidime. This to cover the combination of methicillin sensitive staph aureus Enterococcus faecalis and Pseudomonas. Although the staph aureus was methicillin sensitive and the Enterococcus ampicillin sensitive vancomycin provided the best alternative here that can be given  at dialysis and ceftazidime to cover the Pseudomonas. She had a traumatic wound on the top of her foot and a complicated abscess. She has  significant tissue destruction from this although the wound is looking a lot better today. We are using silver alginate to the surface. X-ray of the foot did not show any deep obvious infection or osteomyelitis 12/1; since the patient was last here she was hospitalized from 11/3 through 11/10 with coag negative staphylococcal bacteremia. I do not know the the exact cause of this was determined. She was seen by vascular and they replaced her permacath on the right side of her neck. Her repeat blood cultures were negative. She is still on IV vancomycin ando Ceftazidime at dialysis. She generally feels systemically well she does not have a fever she is still receiving dialysis but reports she voids up to 8 times a day She has home health changing the dressing I believe they are using silver alginate under 3 layer compression. In addition to the large wound on her right anterior foot that we initially saw her for she has apparently a right lateral leg wound that she suffered during a fall and 2 skin tears on the left anterior lower leg 12/8; patient comes in looking a lot better this week. One of the 2 skin tears she has on the left leg is healed the other is smaller. The area on the right lateral leg looks improved and her original wound on the right dorsal foot is a lot smaller and looks a lot better. We used Hydrofera Blue to all wound areas under 3 layer compression.. Apparently home health had some trouble with the compression wraps. 12/15 patient comes in with all of her wounds smaller including her original deep punched out wound on the right dorsal foot as well as the skin tears on the right posterior lateral calf and the left anterior lower leg. We have been using Hydrofera Blue under compression excellent results Electronic Signature(s) Signed: 09/15/2020 4:29:45 PM By: Linton Ham MD Entered By: Linton Ham on 09/15/2020 11:22:40 [name]  ([id number]) -------------------------------------------------------------------------------- Physical Exam Details [name] Date of Service: 09/15/2020 10:45 AM Medical Record Number: [id number] Patient Account Number: [id number] Date of Birth/Sex: [birth year] (86 y.o. F) Treating RN: Cornell Barman Primary Care Provider: Fulton Reek Other Clinician: Referring Provider: Fulton Reek Treating Provider/Extender: Tito Dine in Treatment: 9 Constitutional Sitting or standing Blood Pressure is within target range for patient.. Pulse regular and within target range for patient.Marland Kitchen Respirations regular, non- labored and within target range.. Temperature is normal and within the target range for the patient.Marland Kitchen appears in no distress. Cardiovascular Edema control is moderate but her wounds seem to be responding. Notes Wound exam; original wound on the right dorsal foot remarkably improved in terms of surface area there is no depth healthy granulation with advancing epithelialization. oFairly sizable skin tear on the right lateral calf is just about closed similarly the area on the left anterior. Both of these are close to resolved Electronic Signature(s) Signed: 09/15/2020 4:29:45 PM By: Linton Ham MD Entered By: Linton Ham on 09/15/2020 11:24:08 [name] ([id number]) -------------------------------------------------------------------------------- Physician Orders Details [name] Date of Service: 09/15/2020 10:45 AM Medical Record Number: [id number] Patient Account Number: [id number] Date of Birth/Sex: Nov 05, 1933 (86 y.o. F) Treating RN: Cornell Barman Primary Care Provider: Fulton Reek Other Clinician: Referring Provider: Fulton Reek Treating Provider/Extender: Tito Dine in Treatment: 9 Verbal / Phone Orders:  No Diagnosis Coding ICD-10 Coding Code Description S90.31XD Contusion of right  foot, subsequent encounter L97.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 with necrosis of muscle L97.811 Non-pressure chronic ulcer of other part of right lower leg limited to breakdown of skin S80.12XD Contusion of left lower leg, subsequent encounter E11.22 Type 2 diabetes mellitus with diabetic chronic kidney disease N18.5 Chronic kidney disease, stage 5 Wound Cleansing Wound #1 Right,Dorsal Foot o Clean wound with Normal Saline. Wound #2 Left,Anterior Lower Leg o Clean wound with Normal Saline. Wound #3 Right,Lateral Lower Leg o Clean wound with Normal Saline. Anesthetic (add to Medication List) Wound #1 Right,Dorsal Foot o Topical Lidocaine 4% cream applied to wound bed prior to debridement (In Clinic Only). Wound #2 Left,Anterior Lower Leg o Topical Lidocaine 4% cream applied to wound bed prior to debridement (In Clinic Only). Wound #3 Right,Lateral Lower Leg o Topical Lidocaine 4% cream applied to wound bed prior to debridement (In Clinic Only). Primary Wound Dressing Wound #1 Right,Dorsal Foot o Hydrafera Blue Ready Transfer Wound #2 Left,Anterior Lower Leg o Hydrafera Blue Ready Transfer Wound #3 Right,Lateral Lower Leg o Hydrafera Blue Ready Transfer Secondary Dressing Wound #1 Right,Dorsal Foot o Dry Gauze Wound #2 Left,Anterior Lower Leg o Dry Gauze Wound #3 Right,Lateral Lower Leg o Dry Gauze Dressing Change Frequency Wound #1 Right,Dorsal Foot o Change Dressing Monday, Wednesday, Friday Wound #2 Left,Anterior Lower Leg [name], [name] ([id number]) o Change Dressing Monday, Wednesday, Friday Wound #3 Right,Lateral Lower Leg o Change Dressing Monday, Wednesday, Friday Follow-up Appointments Wound #1 Right,Dorsal Foot o Return Appointment in 1 week. o Return Appointment in 3 weeks. - patient request Wound #2 Left,Anterior Lower Leg o Return Appointment in 1 week. o Return Appointment in 3 weeks. -  patient request Wound #3 Right,Lateral Lower Leg o Return Appointment in 1 week. o Return Appointment in 3 weeks. - patient request Edema Control Wound #1 Right,Dorsal Foot o 3 Layer Compression System - Bilateral Wound #2 Left,Anterior Lower Leg o 3 Layer Compression System - Bilateral Wound #3 Right,Lateral Lower Leg o 3 Layer Compression System - Bilateral Additional Orders / Instructions Wound #1 Right,Dorsal Foot o Increase protein intake. Wound #2 Left,Anterior Lower Leg o Increase protein intake. Wound #3 Right,Lateral Lower Leg o Increase protein intake. Home Health Wound #1 [place] [facility] o Please direct any NON-WOUND related issues/requests for orders to patient's Primary Care Physician Wound #2 [place] [facility] o Please direct any NON-WOUND related issues/requests for orders to patient's Primary Care Physician Wound #3 Right,Lateral Lower Leg o [place] [facility] o Please direct any NON-WOUND related issues/requests for orders to patient's Primary Care Physician Electronic Signature(s) Signed: 09/15/2020 4:29:45 PM By: Linton Ham MD Signed: 09/16/2020 5:16:15 PM By: Gretta Cool, BSN, RN, CWS, Kim RN, BSN Entered By: Gretta Cool, BSN, RN, CWS, Kim on 09/15/2020 12:09:32 [name] ([id number]) -------------------------------------------------------------------------------- Problem List Details [name] Date of Service: 09/15/2020 10:45 AM Medical Record Number: [id number] Patient Account Number: [id number] Date of Birth/Sex: May 29, 1934 (86 y.o. F) Treating RN: Cornell Barman Primary Care Provider: Fulton Reek Other Clinician: Referring Provider: Fulton Reek Treating Provider/Extender: Tito Dine in Treatment: 9 Active Problems ICD-10 Encounter Code Description Active Date  MDM Diagnosis S90.31XD Contusion of right foot, subsequent encounter 07/14/2020 No Yes L97.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 with necrosis of 07/21/2020 No Yes muscle L97.811  Non-pressure chronic ulcer of other part of right lower leg limited to 09/01/2020 No Yes breakdown of skin S80.12XD Contusion of left lower leg, subsequent encounter 09/01/2020 No Yes E11.22 Type 2 diabetes mellitus with diabetic chronic kidney disease 07/14/2020 No Yes N18.5 Chronic kidney disease, stage 5 07/14/2020 No Yes Inactive Problems ICD-10 Code Description Active Date Inactive Date S90.822D Blister (nonthermal), left foot, subsequent encounter 07/14/2020 07/14/2020 Resolved Problems Electronic Signature(s) Signed: 09/15/2020 4:29:45 PM By: Linton Ham MD Entered By: Linton Ham on 09/15/2020 11:20:37 [name] ([id number]) -------------------------------------------------------------------------------- Progress Note Details [name] Date of Service: 09/15/2020 10:45 AM Medical Record Number: [id number] Patient Account Number: [id number] Date of Birth/Sex: 10/19/1933 (86 y.o. F) Treating RN: Cornell Barman Primary Care Provider: Fulton Reek Other Clinician: Referring Provider: Fulton Reek Treating Provider/Extender: Tito Dine in Treatment: 9 Subjective History of Present Illness (HPI) ADMISSION 07/14/2020. This is a medically complex 86 year old woman who is sent over urgently from her primary care doctor Dr. Doy Hutching at [facility] and we worked her in this afternoon. She is apparently developed a blister on the dorsal foot sometime over the last 2 weeks on the right which seems to have developed a hemorrhagic component to this. The history here is somewhat vague but this is happened since she left the hospital on 07/02/2020. She also has a blistered area on the left dorsal foot although this does not have a  hemorrhagic component to it and it is not threatened to open where is the right side is definitely leaking fluid and could easily break down. I do not believe she has been doing anything specific to this. With regards to her medical history I have reviewed the discharge summary from the hospital from 06/22/2020 through 07/02/2020. 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 She was given prednisone but was not felt to be a candidate for immunosuppression secondary to her age and comorbidities. She is ANA positive p-ANCA positive. She had a right IJ catheter placed on 9/24 and is being done on dialysis. She is a diabetic. She takes prednisone 20 mg twice daily. She was on aspirin and Plavix although I think these have been put on hold. Past medical history includes; pauci-immune crescentic glomerulonephritis. History of peripheral edema and type 2 diabetes left carotid artery stenosis with a history of a CVA, coronary artery disease and hypertension I cannot see any arterial evaluation in her record in [facility]. We could not do arterial ABIs on her because of complaints of discomfort 10/20; 1 week follow-up. This is a patient who is a diabetic. She recently developed acute renal failure because of pauci-immune glomerulonephritis. Sometime during her initial hospitalization she developed a wound on her right foot tremendous swelling in both legs. She was sent over here urgently last week by her primary care physician with what looks to be hematoma still contained on the right dorsal foot. We put silver alginate on this and put her in compression to control some of the swelling. The left leg had a similar appearance with severe edema in the left dorsal foot so we put that under compression as well. When she arrived back for nurse follow-up on Friday there was some drainage coming out of the wound which our  nurse cultured. This is come back showing Pseudomonas Enterococcus faecalis and staph aureus although the identification of the staph aureus is not complete. I empirically put her on cefdinir 300  mg after dialysis on Tuesday Thursday and Saturday she is only had one dose. She has not been systemically unwell 10/27 1 week follow-up. With considerable help of her nursing staff I was finally able to speak to her nephrologist who manages her at dialysis. Started her on vancomycin and ceftazidime. This to cover the combination of methicillin sensitive staph aureus Enterococcus faecalis and Pseudomonas. Although the staph aureus was methicillin sensitive and the Enterococcus ampicillin sensitive vancomycin provided the best alternative here that can be given at dialysis and ceftazidime to cover the Pseudomonas. She had a traumatic wound on the top of her foot and a complicated abscess. She has significant tissue destruction from this although the wound is looking a lot better today. We are using silver alginate to the surface. X-ray of the foot did not show any deep obvious infection or osteomyelitis 12/1; since the patient was last here she was hospitalized from 11/3 through 11/10 with coag negative staphylococcal bacteremia. I do not know the the exact cause of this was determined. She was seen by vascular and they replaced her permacath on the right side of her neck. Her repeat blood cultures were negative. She is still on IV vancomycin ando Ceftazidime at dialysis. She generally feels systemically well she does not have a fever she is still receiving dialysis but reports she voids up to 8 times a day She has home health changing the dressing I believe they are using silver alginate under 3 layer compression. In addition to the large wound on her right anterior foot that we initially saw her for she has apparently a right lateral leg wound that she suffered during a fall and 2 skin tears on the left  anterior lower leg 12/8; patient comes in looking a lot better this week. One of the 2 skin tears she has on the left leg is healed the other is smaller. The area on the right lateral leg looks improved and her original wound on the right dorsal foot is a lot smaller and looks a lot better. We used Hydrofera Blue to all wound areas under 3 layer compression.. Apparently home health had some trouble with the compression wraps. 12/15 patient comes in with all of her wounds smaller including her original deep punched out wound on the right dorsal foot as well as the skin tears on the right posterior lateral calf and the left anterior lower leg. We have been using Hydrofera Blue under compression excellent results Objective Constitutional Sitting or standing Blood Pressure is within target range for patient.. Pulse regular and within target range for patient.Marland Kitchen Respirations regular, non- labored and within target range.. Temperature is normal and within the target range for the patient.Marland Kitchen appears in no distress. [name] ([id number]) Vitals Time Taken: 10:52 AM, Height: 64 in, Weight: 205 lbs, BMI: 35.2, Temperature: 98.3 F, Pulse: 92 bpm, Respiratory Rate: 18 breaths/min, Blood Pressure: 141/82 mmHg. Cardiovascular Edema control is moderate but her wounds seem to be responding. General Notes: Wound exam; original wound on the right dorsal foot remarkably improved in terms of surface area there is no depth healthy granulation with advancing epithelialization. Fairly sizable skin tear on the right lateral calf is just about closed similarly the area on the left anterior. Both of these are close to resolved Integumentary (Hair, Skin) Wound #1 status is Open. Original cause of wound was Blister. The wound is located on the Right,Dorsal Foot. The wound measures 2cm length x 1.3cm width x 0.1cm depth; 2.042cm^2 area  and 0.204cm^3 volume. There is Fat Layer (Subcutaneous Tissue) exposed. There is  a large amount of serosanguineous drainage noted. The wound margin is flat and intact. There is large (67-100%) red, hyper - granulation within the wound bed. There is no necrotic tissue within the wound bed. Wound #2 status is Open. Original cause of wound was Skin Tear/Laceration. The wound is located on the Left,Anterior Lower Leg. The wound measures 0.7cm length x 0.6cm width x 0.1cm depth; 0.33cm^2 area and 0.033cm^3 volume. There is Fat Layer (Subcutaneous Tissue) exposed. There is no tunneling or undermining noted. There is a medium amount of serous drainage noted. There is large (67-100%) red granulation within the wound bed. There is no necrotic tissue within the wound bed. Wound #3 status is Open. Original cause of wound was Skin Tear/Laceration. The wound is located on the Right,Lateral Lower Leg. The wound measures 0.5cm length x 1cm width x 0.1cm depth; 0.393cm^2 area and 0.039cm^3 volume. There is Fat Layer (Subcutaneous Tissue) exposed. There is no tunneling or undermining noted. There is a medium amount of serous drainage noted. There is large (67-100%) red granulation within the wound bed. There is no necrotic tissue within the wound bed. Assessment Active Problems ICD-10 Contusion of right foot, subsequent encounter Non-pressure chronic ulcer of other part of right foot with necrosis of muscle Non-pressure chronic ulcer of other part of right lower leg limited to breakdown of skin Contusion of left lower leg, subsequent encounter Type 2 diabetes mellitus with diabetic chronic kidney disease Chronic kidney disease, stage 5 Procedures Wound #1 Pre-procedure diagnosis of Wound #1 is a Diabetic Wound/Ulcer of the Lower Extremity located on the Right,Dorsal Foot . There was a Three Layer Compression Therapy Procedure with a pre-treatment ABI of 1 by Cornell Barman, RN. Post procedure Diagnosis Wound #1: Same as Pre-Procedure Wound #2 Pre-procedure diagnosis of Wound #2 is a Skin  Tear located on the Left,Anterior Lower Leg . There was a Three Layer Compression Therapy Procedure with a pre-treatment ABI of 1 by Cornell Barman, RN. Post procedure Diagnosis Wound #2: Same as Pre-Procedure Wound #3 Pre-procedure diagnosis of Wound #3 is a Skin Tear located on the Right,Lateral Lower Leg . There was a Three Layer Compression Therapy Procedure with a pre-treatment ABI of 1 by Cornell Barman, RN. Post procedure Diagnosis Wound #3: Same as Pre-Procedure Plan [name], Sarah T. ([id number]) #1 we are continuing with Hydrofera Blue to all wound areas under compression. #2 she is doing very well dimensions are smaller and all the wounds look healthy 3. We talked about perhaps support stockings I am not sure that she needs medical grade compression. Electronic Signature(s) Signed: 09/15/2020 4:29:45 PM By: Linton Ham MD Entered By: Linton Ham on 09/15/2020 11:25:36 [name] ([id number]) -------------------------------------------------------------------------------- SuperBill Details [name] Date of Service: 09/15/2020 Medical Record Number: [id number] Patient Account Number: [id number] Date of Birth/Sex: November 11, 1933 (86 y.o. F) Treating RN: Cornell Barman Primary Care Provider: Fulton Reek Other Clinician: Referring Provider: Fulton Reek Treating Provider/Extender: Tito Dine in Treatment: 9 Diagnosis Coding ICD-10 Codes Code Description S90.31XD Contusion of right foot, subsequent encounter L97.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 with necrosis of muscle L97.811 Non-pressure chronic ulcer of other part of right lower leg limited to breakdown of skin S80.12XD Contusion of left lower leg, subsequent encounter E11.22 Type 2 diabetes mellitus with diabetic chronic kidney disease N18.5 Chronic kidney disease, stage 5 Facility Procedures CPT4: Description Modifier Quantity Code [id number]  BILATERAL: Application of multi-layer venous compression system; leg (below knee), including 1 ankle and foot. Physician Procedures CPT4 Code: [id number] Description: 52591 - WC PHYS LEVEL 3 - EST PT Modifier: Quantity: 1 CPT4 Code: Description: ICD-10 Diagnosis Description L97.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 with necrosis of mu L97.811 Non-pressure chronic ulcer of other part of right lower leg limited to bre S80.12XD Contusion of left lower leg,  subsequent encounter Modifier: scle akdown of skin Quantity: Electronic Signature(s) Signed: 09/15/2020 4:29:45 PM By: Linton Ham MD Signed: 09/16/2020 5:16:15 PM By: Gretta Cool, BSN, RN, CWS, Kim RN, BSN Entered By: Gretta Cool, BSN, RN, CWS, Kim on 09/15/2020 12:07:26

## 2020-09-17 NOTE — Progress Notes (Signed)
IMONIE, TUCH ([id number]) Visit Report for 09/15/2020 Arrival Information Details [name], [name] Date of Service: 09/15/2020 10:45 AM Medical Record Number: [id number] Patient Account Number: [id number] Date of Birth/Sex: 11-21-1933 (86 y.o. F) Treating RN: Dolan Amen Primary Care Jalacia Mattila: Fulton Reek Other Clinician: Referring Melchizedek Espinola: Fulton Reek Treating Korena Nass/Extender: Tito Dine in Treatment: 9 Visit Information History Since Last Visit Pain Present Now: No Patient Arrived: Wheel Chair Arrival Time: 10:50 Accompanied By: husband Transfer Assistance: None Patient Requires Transmission-Based Precautions: No Patient Has Alerts: No Electronic Signature(s) Signed: 09/15/2020 12:11:10 PM By: Georges Mouse, Minus Breeding RN Entered By: Georges Mouse, Minus Breeding on 09/15/2020 10:51:08 [name] ([id number]) -------------------------------------------------------------------------------- Compression Therapy Details [name] Date of Service: 09/15/2020 10:45 AM Medical Record Number: [id number] Patient Account Number: [id number] Date of Birth/Sex: Apr 19, 1934 (86 y.o. F) Treating RN: Cornell Barman Primary Care Hanz Winterhalter: Fulton Reek Other Clinician: Referring Maylyn Narvaiz: Fulton Reek Treating Chaquita Basques/Extender: Tito Dine in Treatment: 9 Compression Therapy Performed for Wound Assessment: Wound #2 Left,Anterior Lower Leg Performed By: Clinician Cornell Barman, RN Compression Type: Three Layer Pre Treatment ABI: 1 Post Procedure Diagnosis Same as Pre-procedure Electronic Signature(s) Signed: 09/16/2020 5:16:15 PM By: Gretta Cool, BSN, RN, CWS, Kim RN, BSN Entered By: Gretta Cool, BSN, RN, CWS, Kim on 09/15/2020 11:16:28 [name] ([id number]) -------------------------------------------------------------------------------- Compression Therapy Details [name] Date of  Service: 09/15/2020 10:45 AM Medical Record Number: [id number] Patient Account Number: [id number] Date of Birth/Sex: 23-May-1934 (86 y.o. F) Treating RN: Cornell Barman Primary Care Gretel Cantu: Fulton Reek Other Clinician: Referring Allia Wiltsey: Fulton Reek Treating Pa Tennant/Extender: Tito Dine in Treatment: 9 Compression Therapy Performed for Wound Assessment: Wound #1 Right,Dorsal Foot Performed By: Clinician Cornell Barman, RN Compression Type: Three Layer Pre Treatment ABI: 1 Post Procedure Diagnosis Same as Pre-procedure Electronic Signature(s) Signed: 09/16/2020 5:16:15 PM By: Gretta Cool, BSN, RN, CWS, Kim RN, BSN Entered By: Gretta Cool, BSN, RN, CWS, Kim on 09/15/2020 11:16:28 [name] ([id number]) -------------------------------------------------------------------------------- Compression Therapy Details [name] Date of Service: 09/15/2020 10:45 AM Medical Record Number: [id number] Patient Account Number: [id number] Date of Birth/Sex: November 30, 1933 (86 y.o. F) Treating RN: Cornell Barman Primary Care Lucianne Smestad: Fulton Reek Other Clinician: Referring Maven Rosander: Fulton Reek Treating Kory Rains/Extender: Tito Dine in Treatment: 9 Compression Therapy Performed for Wound Assessment: Wound #3 Right,Lateral Lower Leg Performed By: Clinician Cornell Barman, RN Compression Type: Three Layer Pre Treatment ABI: 1 Post Procedure Diagnosis Same as Pre-procedure Electronic Signature(s) Signed: 09/16/2020 5:16:15 PM By: Gretta Cool, BSN, RN, CWS, Kim RN, BSN Entered By: Gretta Cool, BSN, RN, CWS, Kim on 09/15/2020 11:16:28 [name] ([id number]) -------------------------------------------------------------------------------- Encounter Discharge Information Details [name] Date of Service: 09/15/2020 10:45 AM Medical Record Number: [id number] Patient Account Number: [id number] Date of Birth/Sex: Jan 16, 1934 (86 y.o.  F) Treating RN: Cornell Barman Primary Care Khloie Hamada: Fulton Reek Other Clinician: Referring Kitzia Camus: Fulton Reek Treating Tymarion Everard/Extender: Tito Dine in Treatment: 9 Encounter Discharge Information Items Discharge Condition: Stable Ambulatory Status: Ambulatory Discharge Destination: Home Transportation: Private Auto Accompanied By: husband Schedule Follow-up Appointment: Yes Clinical Summary of Care: Electronic Signature(s) Signed: 09/16/2020 5:16:15 PM By: Gretta Cool, BSN, RN, CWS, Kim RN, BSN Entered By: Gretta Cool, BSN, RN, CWS, Kim on 09/15/2020 12:09:56 [name] ([id number]) -------------------------------------------------------------------------------- Lower Extremity Assessment Details [name] Date of Service: 09/15/2020 10:45 AM Medical Record Number: [id number] Patient Account Number: [id number] Date of Birth/Sex: 1934-01-14 (86 y.o. F) Treating RN: Laurin Coder,  Minus Breeding Primary Care Zerina Hallinan: Fulton Reek Other Clinician: Referring Lovely Kerins: Fulton Reek Treating Tiffanee Mcnee/Extender: Tito Dine in Treatment: 9 Edema Assessment Assessed: [Left: Yes] [Right: Yes] Edema: [Left: Yes] [Right: Yes] Calf Left: Right: Point of Measurement: 31 cm From Medial Instep 36.5 cm 38 cm Ankle Left: Right: Point of Measurement: 11 cm From Medial Instep 23 cm 24 cm Vascular Assessment Pulses: Dorsalis Pedis Palpable: [Left:Yes] [Right:Yes] Electronic Signature(s) Signed: 09/15/2020 12:11:10 PM By: Georges Mouse, Minus Breeding RN Entered By: Georges Mouse, Minus Breeding on 09/15/2020 11:09:42 [name] ([id number]) -------------------------------------------------------------------------------- Multi Wound Chart Details [name] Date of Service: 09/15/2020 10:45 AM Medical Record Number: [id number] Patient Account Number: [id number] Date of Birth/Sex: [birth year] (86 y.o. 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 Comorbid History: Congestive Heart Failure, Congestive Heart Failure, Congestive Heart Failure, Hypertension, Type II Diabetes, Hypertension, Type II Diabetes, Hypertension, Type II Diabetes, Received Radiation Received Radiation Received Radiation Date Acquired: 07/02/2020 08/30/2020 08/30/2020 Weeks of Treatment: 9 2 2  Wound Status: Open Open Open Measurements L x W x D (cm) 2x1.3x0.1 0.7x0.6x0.1 0.5x1x0.1 Area (cm) : 2.042 0.33 0.393 Volume (cm) : 0.204 0.033 0.039 % Reduction in Area: 92.10% 79.00% 95.90% % Reduction in Volume: 92.10% 79.00% 95.90% Classification: Grade 2 Full Thickness Without Exposed Full Thickness Without Exposed Support Structures Support Structures Exudate Amount: Large Medium Medium Exudate Type: Serosanguineous Serous Serous Exudate Color: red, brown amber amber Wound Margin: Flat and Intact N/A N/A Granulation Amount: Large (67-100%) Large (67-100%) Large (67-100%) Granulation Quality: Red, Hyper-granulation Red Red Necrotic Amount: None Present (0%) None Present (0%) None Present (0%) Exposed Structures: Fat Layer (Subcutaneous Tissue): Fat Layer (Subcutaneous Tissue): Fat Layer (Subcutaneous Tissue): Yes Yes Yes Fascia: No Fascia: No Fascia: No Tendon: No Tendon: No Tendon: No Muscle: No Muscle: No Muscle: No Joint: No Joint: No Joint: No Bone: No Bone: No Bone:  No Epithelialization: None Large (67-100%) Large (67-100%) Procedures Performed: Compression Therapy Compression Therapy Compression Therapy Treatment Notes Electronic Signature(s) Signed: 09/15/2020 4:29:45 PM By: Linton Ham MD Entered By: Linton Ham on 09/15/2020 11:20:48 [name] ([id number]) [name] ([id number]) -------------------------------------------------------------------------------- Multi-Disciplinary Care Plan Details [name] Date of Service: 09/15/2020 10:45 AM Medical Record Number: [id number] Patient Account Number: [id number] Date of Birth/Sex: November 29, 1933 (86 y.o. F) Treating RN: Cornell Barman Primary Care Asma Boldon: Fulton Reek Other Clinician: Referring Zamyah Wiesman: Fulton Reek Treating Hasset Chaviano/Extender: Tito Dine in Treatment: 9 Active Inactive Necrotic Tissue Nursing Diagnoses: Impaired tissue integrity related to necrotic/devitalized tissue Goals: Necrotic/devitalized tissue will be minimized in the wound bed Date Initiated: 07/14/2020 Target Resolution Date: 09/29/2020 Goal Status: Active Interventions: Assess patient pain level pre-, during and post procedure and prior to discharge Treatment Activities: Apply topical anesthetic as ordered : 07/14/2020 Notes: Electronic Signature(s) Signed: 09/16/2020 5:16:15 PM By: Gretta Cool, BSN, RN, CWS, Kim RN, BSN Entered By: Gretta Cool, BSN, RN, CWS, Kim on 09/15/2020 11:15:51 [name] ([id number]) -------------------------------------------------------------------------------- Pain Assessment Details [name] Date of Service: 09/15/2020 10:45 AM Medical Record Number: [id number] Patient Account Number: [id number] Date  of Birth/Sex: 1934/08/07 (86 y.o. F) Treating RN: Dolan Amen Primary Care Romeka Scifres: Fulton Reek Other Clinician: Referring Amalia Edgecombe: Fulton Reek Treating Kyvon Hu/Extender: Tito Dine in Treatment: 9 Active Problems Location of Pain Severity and Description of Pain Patient Has Paino No Site Locations Rate the pain. Current Pain Level: 0 Pain Management and Medication Current Pain Management: Electronic Signature(s) Signed: 09/15/2020 12:11:10 PM By: Georges Mouse, Minus Breeding RN Entered By: Georges Mouse, Minus Breeding on 09/15/2020 10:52:28 [name] ([id number]) -------------------------------------------------------------------------------- Patient/Caregiver Education Details [name] Date of Service: 09/15/2020 10:45 AM Medical Record Number: [id number] Patient Account Number: [id number] Date of Birth/Gender: [birth year] (86 y.o. F) Treating RN: Cornell Barman Primary Care Physician: Fulton Reek Other Clinician: Referring Physician: Fulton Reek Treating Physician/Extender: Tito Dine in Treatment: 9 Education Assessment Education Provided To: Patient Education Topics Provided Pressure: Venous: Handouts: Controlling Swelling with Multilayered Compression Wraps Methods: Demonstration, Explain/Verbal Responses: State content correctly Wound/Skin Impairment: Electronic Signature(s) Signed: 09/16/2020 5:16:15 PM By: Gretta Cool, BSN, RN, CWS, Kim RN, BSN Entered By: Gretta Cool, BSN, RN, CWS, Kim on 09/15/2020 12:07:49 [name] ([id number]) -------------------------------------------------------------------------------- Wound Assessment Details [name] Date of Service: 09/15/2020 10:45 AM Medical Record Number: [id number] Patient Account Number: [id number] Date of Birth/Sex: 1933-12-15 (86 y.o. F) Treating RN: Dolan Amen Primary Care Aleksei Goodlin: Fulton Reek Other Clinician: Referring Sharmain Lastra: Fulton Reek Treating Nickolaos Brallier/Extender: Tito Dine in Treatment: 9 Wound Status Wound Number: 1 Primary Diabetic Wound/Ulcer of the Lower  Extremity Etiology: Wound Location: Right, Dorsal Foot Wound Status: Open Wounding Event: Blister Comorbid Congestive Heart Failure, Hypertension, Type II Date Acquired: 07/02/2020 History: Diabetes, Received Radiation Weeks Of Treatment: 9 Clustered Wound: No Photos Wound Measurements Length: (cm) 2 Width: (cm) 1.3 Depth: (cm) 0.1 Area: (cm) 2.042 Volume: (cm) 0.204 % Reduction in Area: 92.1% % Reduction in Volume: 92.1% Epithelialization: None Wound Description Classification: Grade 2 Wound Margin: Flat and Intact Exudate Amount: Large Exudate Type: Serosanguineous Exudate Color: red, brown Foul Odor After Cleansing: No Slough/Fibrino Yes Wound Bed Granulation Amount: Large (67-100%) Exposed Structure Granulation Quality: Red, Hyper-granulation Fascia Exposed: No Necrotic Amount: None Present (0%) Fat Layer (Subcutaneous Tissue) Exposed: Yes Tendon Exposed: No Muscle Exposed: No Joint Exposed: No Bone Exposed: No Treatment Notes Wound #1 (Right, Dorsal Foot) Notes H.blue, 3LB Electronic Signature(s) Signed: 09/15/2020 12:11:10 PM By: Georges Mouse, Minus Breeding RN McLean, Amilyah T. ([id number]) Entered By: Georges Mouse, Minus Breeding on 09/15/2020 11:06:57 [name] ([id number]) -------------------------------------------------------------------------------- Wound Assessment Details [name] Date of Service: 09/15/2020 10:45 AM Medical Record Number: [id number] Patient Account Number: [id number] Date of Birth/Sex: [birth year] (86 y.o. F) Treating RN: Dolan Amen Primary Care Wei Newbrough: Fulton Reek Other Clinician: Referring Jenayah Antu: Fulton Reek Treating Wanetta Funderburke/Extender: Tito Dine in Treatment: 9 Wound Status Wound Number: 2 Primary Skin Tear Etiology: Wound Location: Left, Anterior Lower Leg Wound Status: Open Wounding Event: Skin Tear/Laceration Comorbid Congestive Heart Failure, Hypertension, Type  II Date Acquired: 08/30/2020 History: Diabetes, Received Radiation Weeks Of Treatment: 2 Clustered Wound: No Photos Wound Measurements Length: (cm) 0.7 Width: (cm) 0.6 Depth: (cm) 0.1 Area: (cm) 0.33 Volume: (cm) 0.033 % Reduction in Area: 79% % Reduction in Volume: 79% Epithelialization: Large (67-100%) Tunneling: No Undermining: No Wound Description Classification: Full Thickness Without Exposed Support Structures Exudate Amount: Medium Exudate Type: Serous Exudate Color: amber Foul Odor After Cleansing: No Slough/Fibrino No Wound Bed Granulation Amount: Large (67-100%) Exposed Structure Granulation Quality: Red Fascia Exposed: No Necrotic Amount: None  Present (0%) Fat Layer (Subcutaneous Tissue) Exposed: Yes Tendon Exposed: No Muscle Exposed: No Joint Exposed: No Bone Exposed: No Treatment Notes Wound #2 (Left, Anterior Lower Leg) Notes H.blue, 3LB Electronic Signature(s) Signed: 09/15/2020 12:11:10 PM By: Georges Mouse, Minus Breeding RN Murdock, Tanyika T. ([id number]) Entered By: Georges Mouse, Minus Breeding on 09/15/2020 11:07:34 [name] ([id number]) -------------------------------------------------------------------------------- Wound Assessment Details [name] Date of Service: 09/15/2020 10:45 AM Medical Record Number: [id number] Patient Account Number: [id number] Date of Birth/Sex: [birth year] (86 y.o. F) Treating RN: Dolan Amen Primary Care Shante Maysonet: Fulton Reek Other Clinician: Referring Lynette Topete: Fulton Reek Treating Tanazia Achee/Extender: Tito Dine in Treatment: 9 Wound Status Wound Number: 3 Primary Skin Tear Etiology: Wound Location: Right, Lateral Lower Leg Wound Status: Open Wounding Event: Skin Tear/Laceration Comorbid Congestive Heart Failure, Hypertension, Type II Date Acquired: 08/30/2020 History: Diabetes, Received Radiation Weeks Of Treatment: 2 Clustered Wound: No Photos Wound  Measurements Length: (cm) 0.5 Width: (cm) 1 Depth: (cm) 0.1 Area: (cm) 0.393 Volume: (cm) 0.039 % Reduction in Area: 95.9% % Reduction in Volume: 95.9% Epithelialization: Large (67-100%) Tunneling: No Undermining: No Wound Description Classification: Full Thickness Without Exposed Support Structures Exudate Amount: Medium Exudate Type: Serous Exudate Color: amber Foul Odor After Cleansing: No Slough/Fibrino No Wound Bed Granulation Amount: Large (67-100%) Exposed Structure Granulation Quality: Red Fascia Exposed: No Necrotic Amount: None Present (0%) Fat Layer (Subcutaneous Tissue) Exposed: Yes Tendon Exposed: No Muscle Exposed: No Joint Exposed: No Bone Exposed: No Treatment Notes Wound #3 (Right, Lateral Lower Leg) Notes H.blue, 3LB Electronic Signature(s) Signed: 09/15/2020 12:11:10 PM By: Georges Mouse, Minus Breeding RN [place], Kerensa T. ([id number]) Entered By: Georges Mouse, Minus Breeding on 09/15/2020 11:08:12 [name] ([id number]) -------------------------------------------------------------------------------- Vitals Details [name] Date of Service: 09/15/2020 10:45 AM Medical Record Number: [id number] Patient Account Number: [id number] Date of Birth/Sex: 10/09/1933 (86 y.o. F) Treating RN: Dolan Amen Primary Care Sylis Ketchum: Fulton Reek Other Clinician: Referring Astin Sayre: Fulton Reek Treating Braylee Bosher/Extender: Tito Dine in Treatment: 9 Vital Signs Time Taken: 10:52 Temperature (F): 98.3 Height (in): 64 Pulse (bpm): 92 Weight (lbs): 205 Respiratory Rate (breaths/min): 18 Body Mass Index (BMI): 35.2 Blood Pressure (mmHg): 141/82 Reference Range: 80 - 120 mg / dl Electronic Signature(s) Signed: 09/15/2020 12:11:10 PM By: Georges Mouse, Minus Breeding RN Entered By: Georges Mouse, Minus Breeding on 09/15/2020 10:52:18

## 2020-09-22 ENCOUNTER — Ambulatory Visit: Payer: Medicare Other | Admitting: Internal Medicine

## 2020-09-29 NOTE — Telephone Encounter (Signed)
Patient called back and chose to be scheduled for her left brachiocephalic AV fistula on 05/15/87 with Dr. Lucky Cowboy. Patient will do a phone call pre-op on 10/04/20 between 8-1 pm and covid testing on 10/05/20 between 8-1 pm at the [facility]. Pre-surgical instructions will be mailed.

## 2020-10-04 ENCOUNTER — Other Ambulatory Visit: Payer: Self-pay

## 2020-10-04 ENCOUNTER — Other Ambulatory Visit (INDEPENDENT_AMBULATORY_CARE_PROVIDER_SITE_OTHER): Payer: Self-pay | Admitting: Nurse Practitioner

## 2020-10-04 ENCOUNTER — Encounter
Admission: RE | Admit: 2020-10-04 | Discharge: 2020-10-04 | Disposition: A | Discharge status: Home / Self Care | Payer: Medicare Other | Source: Ambulatory Visit | Attending: Vascular Surgery | Admitting: Vascular Surgery

## 2020-10-04 DIAGNOSIS — Z01812 Encounter for preprocedural laboratory examination: Secondary | ICD-10-CM | POA: Insufficient documentation

## 2020-10-04 NOTE — Patient Instructions (Signed)
Your procedure is scheduled on: Thursday 10/07/20.  Report to THE FIRST FLOOR REGISTRATION DESK IN THE MEDICAL MALL ON THE MORNING OF SURGERY FIRST, THEN YOU WILL CHECK IN AT THE SURGERY INFORMATION DESK LOCATED OUTSIDE THE SAME DAY SURGERY DEPARTMENT LOCATED ON 2ND FLOOR MEDICAL MALL ENTRANCE.  To find out your arrival time please call [phone] between 1PM - 3PM on Wednesday 10/06/20.   Remember: Instructions that are not followed completely may result in serious medical risk, up to and including death, or upon the discretion of your surgeon and anesthesiologist your surgery may need to be rescheduled.     __X__ 1. Do not eat food after midnight the night before your procedure.                 No gum chewing or hard candies. You may drink clear liquids up to 2 hours                 before you are scheduled to arrive for your surgery- DO NOT drink clear                 liquids within 2 hours of the start of your surgery.                 Clear Liquids include:  water, apple juice without pulp, clear carbohydrate                 drink such as Clearfast or Gatorade, Black Coffee or Tea (Do not add                 milk or creamer to coffee or tea).   __X__2.  On the morning of surgery brush your teeth with toothpaste and water, you may rinse your mouth with mouthwash if you wish.  Do not swallow any toothpaste or mouthwash.    __X__ 3.  No Alcohol for 24 hours before or after surgery.  __X__ 4.  Do Not Smoke or use e-cigarettes For 24 Hours Prior to Your Surgery.                 Do not use any chewable tobacco products for at least 6 hours prior to                 surgery.  __X__5.  Notify your doctor if there is any change in your medical condition      (cold, fever, infections).      Do NOT wear jewelry, make-up, hairpins, clips or nail polish. Do NOT wear lotions, powders, or perfumes.  Do NOT shave 48 hours prior to surgery. Men may shave face and neck. Do NOT bring valuables to  the hospital.     [facility] is not responsible for any belongings or valuables.   Contacts, dentures/partials or body piercings may not be worn into surgery. Bring a case for your contacts, glasses or hearing aids, a denture cup will be supplied.    Patients discharged the day of surgery will not be allowed to drive home.     __X__ Take these medicines the morning of surgery with A SIP OF WATER:     1. cetirizine (ZYRTEC)   2. gabapentin (NEURONTIN)   3. hydrALAZINE (APRESOLINE)  4. levothyroxine (SYNTHROID, LEVOTHROID)   5. metoprolol tartrate (LOPRESSOR)     __X__ Use SAGE wipes as directed.  __X__ Stop Blood Thinners: Plavix. You will re-start this after your surgery. Continue your Aspirin.  __X__ Stop Anti-inflammatories  7 days before surgery such as Advil, Ibuprofen, Motrin, BC or Goodies Powder, Naprosyn, Naproxen, Aleve, Aspirin, Meloxicam. May take Tylenol if needed for pain or discomfort.   __X__Do not start taking any new herbal supplements or vitamins prior to your procedure.     Wear comfortable clothing (specific to your surgery type) to the hospital.  Plan for stool softeners for home use; pain medications have a tendency to cause constipation. You can also help prevent constipation by eating foods high in fiber such as fruits and vegetables and drinking plenty of fluids as your diet allows.  After surgery, you can prevent lung complications by doing breathing exercises.Take deep breaths and cough every 1-2 hours. Your doctor may order a device called an Incentive Spirometer to help you take deep breaths.  Please call the [place] Department at [phone] if you have any questions about these instructions.

## 2020-10-05 ENCOUNTER — Other Ambulatory Visit
Admission: RE | Admit: 2020-10-05 | Discharge: 2020-10-05 | Disposition: A | Discharge status: Home / Self Care | Payer: Medicare Other | Source: Ambulatory Visit | Attending: Vascular Surgery | Admitting: Vascular Surgery

## 2020-10-05 ENCOUNTER — Telehealth: Payer: Self-pay | Admitting: *Deleted

## 2020-10-05 ENCOUNTER — Other Ambulatory Visit: Payer: Self-pay

## 2020-10-05 DIAGNOSIS — Z20822 Contact with and (suspected) exposure to covid-19: Secondary | ICD-10-CM | POA: Diagnosis not present

## 2020-10-05 DIAGNOSIS — Z01812 Encounter for preprocedural laboratory examination: Secondary | ICD-10-CM | POA: Diagnosis present

## 2020-10-05 LAB — TYPE AND SCREEN
ABO/RH(D): O POS
Antibody Screen: NEGATIVE

## 2020-10-05 NOTE — Telephone Encounter (Signed)
   Primary Cardiologist: Dr. Rockey Situ, MD   Chart reviewed as part of pre-operative protocol coverage. Because of [name]'s past medical history and current complaints of dizziness and fatigue, she will require a follow-up visit with her primary care physician or cardiovascular team in order to better assess preoperative cardiovascular risk. She denies chest pain or SOB. No recent fevers or chills.   Pre-op covering staff: -Due to her symptoms of dizziness, she will require a follow up visit prior to cardiac clearance for her procedure. The visit can be with either her PCP or cardiology although I do not feel that her symptoms are cardiac in etiology. - Please contact requesting surgeon's office via preferred method (i.e, phone, fax) to inform them of need for appointment prior to surgery.  If applicable, this message will also be routed to pharmacy pool and/or primary cardiologist for input on holding anticoagulant/antiplatelet agent as requested below so that this information is available to the clearing provider at time of patient's appointment.   Kathyrn Drown, NP  10/05/2020, 10:46 AM

## 2020-10-05 NOTE — Telephone Encounter (Signed)
I tried to reach the pt on both numbers available for the pt though unable to leave a message. Pt needs a pre op apt. Pt's procedure is set for 10/07/20. I will send a message to the [place] office to see if they may also try and reach the pt. I was going to offer appt for today with either Dr. Rockey Situ or APP.

## 2020-10-05 NOTE — Telephone Encounter (Signed)
I thought I would try again to reach the pt as her surgery is in 2 days 10/07/20. [name] in our [place] office has also tried to reach the pt, though has not been successful.

## 2020-10-05 NOTE — Telephone Encounter (Signed)
Request for pre-operative cardiac clearance Received: Today Karen Kitchens, NP  P [facility] Request for pre-operative cardiac clearance:  LEFT ARTERIOVENOUS (AV) FISTULA CREATION (BRACIOCEPHALIC)   2. When is this surgery scheduled?  10/08/2019    3. Are there any medications that need to be held prior to surgery?  ASA + CLOPIDOGREL   4. Practice name and name of physician performing surgery?  Performing surgeon: Dr. [name], MD  Requesting clearance: Honor Loh, FNP-C     5. Anesthesia type (none, local, MAC, general)? General   6. What is the office phone and fax number?   Phone: [phone]  Fax: [phone]   NOTE: Please see EKG performed today (10/05/2020) in PAT.   ATTENTION: Unable to create telephone message as per your standard workflow. Directed by [facility] providers to send requests for cardiac clearance to this pool for appropriate distribution to provider covering pre-operative clearances.   Honor Loh, MSN, APRN, FNP-C, CEN  [facility]  Peri-operative Services Nurse Practitioner  Phone: [phone]  10/05/20 10:06 AM

## 2020-10-06 ENCOUNTER — Telehealth (INDEPENDENT_AMBULATORY_CARE_PROVIDER_SITE_OTHER): Payer: Self-pay

## 2020-10-06 ENCOUNTER — Encounter: Payer: Self-pay | Admitting: Urgent Care

## 2020-10-06 LAB — SARS CORONAVIRUS 2 (TAT 6-24 HRS): SARS Coronavirus 2: NEGATIVE

## 2020-10-06 NOTE — Telephone Encounter (Signed)
Honor Loh NP: Hey [name].... We are going to have to postpone this lady. Has significant CV disease. Felt "horrible" when here. Tachycardic to the 110s. Fatigued, SOB, and dizzy. Reached out to cardiology and they recommend her being seen prior to proceeding. We have been trying to get her on the phone for 2 days.... finally reached the husband today. She has HD until 1600.Marland Kitchen.. will follow up with her at that time and see when we can get her to cards.   Me:  Hi Gaspar Bidding, sorry It has been ridiculous but I have her canceled for now. Thank you  Honor Loh NP: Oh I know [name]. I tried my best to get them to sign off on her. She was just seen by PCP and cards, but they would not do it with the new symptoms. Hopefully nothing and we can get he back on ASAP. Sorry to bother you... I know you are busy. [place] for always so kindly helping me through these issues. Just want what is best for these people. You guys have a difficult population. Dr. Lucky Cowboy and Schnier are great and we want to ensure quality outcome.  Spoke with the patient and she has been made aware that the surgery was canceled. The patient does have some questions.

## 2020-10-06 NOTE — Telephone Encounter (Signed)
Attempted to schedule same day clearance eval with berge at 220.  No ans no vm x 2 on home and cell.  Emergency contact is cell # in chart.  Holding slot   Will fu and call again at 1 pm.   Per pre admit will Dr. Rockey Situ review and advise on clearance without ov as procedure is 1/6

## 2020-10-06 NOTE — Progress Notes (Signed)
  [facility] Perioperative Services: Pre-Admission/Anesthesia Testing     Date: 10/06/20  Name: [name] MRN:   [id number]  Re: Need to postpone procedure   Case: [id number] Date/Time: 10/07/20 1426   Procedure: ARTERIOVENOUS (AV) FISTULA CREATION ([place]) (Left )   Anesthesia type: General   Pre-op diagnosis: ESRD   Location: [facility] OR ROOM 07 / [place]   Surgeons: Algernon Huxley, MD    Patient is scheduled for the above procedure on 10/07/2020 with Dr. [name].  Patient presented to PAT clinic on 10/05/2020 for preoperative testing.  As part of her work-up, patient had an ECG performed that showed sinus tachycardia at a rate of 113 bpm; left axis deviation, and evidence of age undetermined septal and inferior infarctions.  ECG compared to previous tracing from 08/04/2020 that showed sinus rhythm with a rate of 63 bpm, LVH, anterior Q waves, and borderline inferior T wave abnormalities.  Patient was just seen by her primary cardiologist Rockey Situ, MD) on 08/20/2020.  Preoperative cardiac clearance was requested from the CV pool at [facility] yesterday.  Cardiology APP contacted patient who reported dizziness, fatigue, increased shortness of breath.  APP unable to clear patient over the phone and recommended follow-up with PCP and or cardiology for further evaluation.  Multiple attempts were made to contact patient to schedule follow-up with cardiology, however we were not able to make contact with the patient again.  Plan was for appointment to be offered on 10/05/2020 or 10/06/2020 in efforts to prevent patient procedure scheduled for 10/07/2020.  Again, unable to contact patient or leave message on machine.    Contacted cardiology office to speak with primary cardiologist.  Dr. Rockey Situ not in the office today, however chart briefly reviewed by another attending cardiologist (End, MD).  Given patient's new symptoms in the setting of  significant cardiovascular disease, cardiologist recommended the patient be seen in the office prior to proceeding with her planned vascular procedure.     Finally able to contact patient's husband and learned that patient has been at hemodialysis treatments.  Husband asked to have patient contact cardiology office when she gets home.  Cardiology office also to follow-up with patient in efforts to schedule an expedited appointment for cardiology evaluation and presurgical clearance.   Call placed to [facility] to make them aware.  Secure message also sent to primary attending surgeon Lucky Cowboy, MD) to make him aware of the need to postpone the procedure scheduled for tomorrow.  SDS charge nurse Barbette Merino, RN) made aware.   Honor Loh, MSN, APRN, FNP-C, CEN [facility]  Peri-operative Services Nurse Practitioner Phone: [phone] 10/06/20 2:52 PM

## 2020-10-06 NOTE — Telephone Encounter (Signed)
Pt has been scheduled to see Ignacia Bayley, NP 10/07/20 for pre op clearance. I will forward notes to NP for upcoming appt. I will also send FYI to requesting provider.

## 2020-10-07 ENCOUNTER — Encounter: Payer: Self-pay | Admitting: Nurse Practitioner

## 2020-10-07 ENCOUNTER — Ambulatory Visit (INDEPENDENT_AMBULATORY_CARE_PROVIDER_SITE_OTHER): Payer: Medicare Other | Admitting: Nurse Practitioner

## 2020-10-07 ENCOUNTER — Ambulatory Visit: Admission: RE | Admit: 2020-10-07 | Payer: Medicare Other | Source: Home / Self Care | Admitting: Vascular Surgery

## 2020-10-07 ENCOUNTER — Other Ambulatory Visit: Payer: Self-pay

## 2020-10-07 ENCOUNTER — Ambulatory Visit: Payer: Medicare Other | Admitting: Physician Assistant

## 2020-10-07 ENCOUNTER — Encounter: Admission: RE | Payer: Self-pay | Source: Home / Self Care

## 2020-10-07 VITALS — BP 110/58 | HR 94 | Ht 64.0 in | Wt 192.0 lb

## 2020-10-07 DIAGNOSIS — I25810 Atherosclerosis of coronary artery bypass graft(s) without angina pectoris: Secondary | ICD-10-CM | POA: Diagnosis not present

## 2020-10-07 DIAGNOSIS — I10 Essential (primary) hypertension: Secondary | ICD-10-CM | POA: Diagnosis not present

## 2020-10-07 DIAGNOSIS — Z992 Dependence on renal dialysis: Secondary | ICD-10-CM

## 2020-10-07 DIAGNOSIS — Z01810 Encounter for preprocedural cardiovascular examination: Secondary | ICD-10-CM

## 2020-10-07 DIAGNOSIS — E785 Hyperlipidemia, unspecified: Secondary | ICD-10-CM

## 2020-10-07 DIAGNOSIS — E119 Type 2 diabetes mellitus without complications: Secondary | ICD-10-CM

## 2020-10-07 DIAGNOSIS — N186 End stage renal disease: Secondary | ICD-10-CM

## 2020-10-07 SURGERY — ARTERIOVENOUS (AV) FISTULA CREATION
Anesthesia: General | Laterality: Left

## 2020-10-07 NOTE — Patient Instructions (Signed)
Medication Instructions:   Your physician recommends that you continue on your current medications as directed. Please refer to the Current Medication list given to you today.   *If you need a refill on your cardiac medications before your next appointment, please call your pharmacy*   Lab Work: None ordered .   Testing/Procedures: None ordered   Follow-Up: At [facility], you and your health needs are our priority.  As part of our continuing mission to provide you with exceptional heart care, we have created designated Provider Care Teams.  These Care Teams include your primary Cardiologist (physician) and Advanced Practice Providers (APPs -  Physician Assistants and Nurse Practitioners) who all work together to provide you with the care you need, when you need it.  We recommend signing up for the patient portal called "MyChart".  Sign up information is provided on this After Visit Summary.  MyChart is used to connect with patients for Virtual Visits (Telemedicine).  Patients are able to view lab/test results, encounter notes, upcoming appointments, etc.  Non-urgent messages can be sent to your provider as well.   To learn more about what you can do with MyChart, go to [email or web address].    Your next appointment:    Follow up as scheduled with Dr. Rockey Situ

## 2020-10-07 NOTE — Progress Notes (Signed)
Office Visit    [name] Date of Encounter: 10/07/2020  Primary Care Provider:  Idelle Crouch, MD Primary Cardiologist:  Ida Rogue, MD  Chief Complaint    87 year old female with a history of CAD status post coronary artery bypass grafting x4 in 2013, postsurgical stroke with right-sided deficits, end-stage renal disease on hemodialysis, hydronephrosis, hypertension, hyperlipidemia, diabetes, breast cancer, and GERD, who presents for preoperative evaluation.  Past Medical History    Past Medical History:  Diagnosis Date  . Arthritis   . Bilateral Macular degeneration   . Breast cancer ([place]) 2012   right breast  . Breast mass, right   . CAD S/P CABG x 4    a. 09/2012 CABG x 4: LIMA to LAD, SVG to Diag, SVG to OM1, SVG to PDA, EVH from bilateral thighs  . Cancer [facility])    breast cancer, right side 2012  . Complication of anesthesia   . Coronary artery disease 08/22/2012   sees Dr Rockey Situ  . Diabetes ([place])   . Diastolic dysfunction    a. 06/2020 Echo: EF 60-65%; b. 08/2020 Echo: EF 60-65%, no rwma, Gr1 DD. Nl RV size/fxn.  . DOE (dyspnea on exertion)   . ESRD (end stage renal disease) ([place])   . Gastritis    hx of  . GERD (gastroesophageal reflux disease)   . Headache(784.0)    migraines  . History of breast cancer    39 treatments of radiation. Negative chemo.  Marland Kitchen History of seasonal allergies   . Hyperlipemia   . Hypertension    sees Dr. Fulton Reek  . Hypothyroidism   . Neuropathy   . Personal history of radiation therapy   . Pneumonia    hx of  . PONV (postoperative nausea and vomiting)   . Stroke ([place])    2012  . Vertigo    Past Surgical History:  Procedure Laterality Date  . ABDOMINAL HYSTERECTOMY    . APPENDECTOMY    . BACK SURGERY    . BREAST BIOPSY Right    2012 positive- [facility]  . BREAST LUMPECTOMY Right 2012   F/U radiation   . BREAST SURGERY    . CARDIAC CATHETERIZATION  2014  . CAROTID PTA/STENT INTERVENTION  Left 06/14/2020   Procedure: CAROTID PTA/STENT INTERVENTION;  Surgeon: Algernon Huxley, MD;  Location: [place] CV LAB;  Service: Cardiovascular;  Laterality: Left;  . CATARACT EXTRACTION W/PHACO Right 08/14/2017   Procedure: CATARACT EXTRACTION PHACO AND INTRAOCULAR LENS PLACEMENT (IOC);  Surgeon: Birder Robson, MD;  Location: [facility] ORS;  Service: Ophthalmology;  Laterality: Right;  [place] 00:43.0 AP% 17.1 CDE 7.37 Fluid Pack lot # [id number]  . CATARACT EXTRACTION W/PHACO Left 10/09/2017   Procedure: CATARACT EXTRACTION PHACO AND INTRAOCULAR LENS PLACEMENT ([place]);  Surgeon: Birder Robson, MD;  Location: [facility] ORS;  Service: Ophthalmology;  Laterality: Left;  [place] 00:30.3 AP% 11.0 CDE 3.33 Fluid Pack Lot # [id number]  . CORONARY ARTERY BYPASS GRAFT  09/05/2012   Procedure: CORONARY ARTERY BYPASS GRAFTING (CABG);  Surgeon: Rexene Alberts, MD;  Location: [place];  Service: Open Heart Surgery;  Laterality: N/A;  CABG x four, using left internal mammary artery and bilateral greater saphenous vein harvested endoscopically  . DIALYSIS/PERMA CATHETER INSERTION Left 06/25/2020   Procedure: DIALYSIS/PERMA CATHETER INSERTION;  Surgeon: Algernon Huxley, MD;  Location: [place] CV LAB;  Service: Cardiovascular;  Laterality: Left;  . DIALYSIS/PERMA CATHETER INSERTION N/A 08/10/2020   Procedure: DIALYSIS/PERMA CATHETER INSERTION;  Surgeon:  Schnier, Dolores Lory, MD;  Location: [place] CV LAB;  Service: Cardiovascular;  Laterality: N/A;  . DIALYSIS/PERMA CATHETER REMOVAL N/A 08/05/2020   Procedure: DIALYSIS/PERMA CATHETER REMOVAL;  Surgeon: Algernon Huxley, MD;  Location: [place] CV LAB;  Service: Cardiovascular;  Laterality: N/A;  . INTRAOPERATIVE TRANSESOPHAGEAL ECHOCARDIOGRAM  09/05/2012   Procedure: INTRAOPERATIVE TRANSESOPHAGEAL ECHOCARDIOGRAM;  Surgeon: Rexene Alberts, MD;  Location: [place];  Service: Open Heart Surgery;  Laterality: N/A;  . KNEE SURGERY     Bilateral nerve block  . LAPAROSCOPIC NISSEN  FUNDOPLICATION    . OVARIAN CYST REMOVAL      Allergies  Allergies  Allergen Reactions  . Clonidine Other (See Comments) and Shortness Of Breath    Other reaction(s): Other (see comments) Other reaction(s): Other (See Comments)  . Darvocet [Propoxyphene N-Acetaminophen] Anaphylaxis  . Hydralazine     Other reaction(s): Other (See Comments) glomerulonephritis  . Hydralazine Hcl Other (See Comments)    glomerulonephritis  . Esomeprazole     Other reaction(s): Unknown  . Guaifenesin     Other reaction(s): Unknown Other reaction(s): Unknown Other reaction(s): Unknown Other reaction(s): Unknown  . Phenylephrine-Guaifenesin     Other reaction(s): Unknown  . Rabeprazole     Other reaction(s): Unknown  . Telithromycin     Other reaction(s): Unknown Other reaction(s): Unknown Other reaction(s): Unknown  . Fluocinolone Other (See Comments)    Feels crazy Other reaction(s): Other (see comments) Other reaction(s): Other (See Comments) Feels crazy Feels crazy  . Iodinated Diagnostic Agents Nausea And Vomiting and Other (See Comments)    Burning Other reaction(s): Other (see comments) Other reaction(s): Other (See Comments) Burning Nausea & vomiting  . Levaquin [Levofloxacin] Nausea Only  . Statins Other (See Comments)    Myalgia Other reaction(s): Other (See Comments), Unknown, Unknown Myalgia    History of Present Illness    87 year old female with the above complex past medical history including coronary artery disease status post CABG x4 in 2013, postsurgical stroke with right-sided deficits, end-stage renal disease on hemodialysis, hydronephrosis, hypertension, hyperlipidemia, diabetes, right-sided breast cancer, GERD, and hypothyroidism.  She was most recently hospitalized in November 2021 with an infected wound to the dorsal aspect of the right foot.  Wound cultures were positive for Pseudomonas, Enterococcus and MSSA.  She was treated with IV vancomycin and Zosyn.   Echocardiogram performed during hospitalization showed normal LV function with grade 1 diastolic dysfunction.  She discharged home on IV antibiotics and last followed up in cardiology clinic on November 19, at which time she was stable.  Since then, she has worked with physical therapy and is now walking on her own with a walker around her home.  She has not been experiencing any chest pain or dyspnea.  In the setting of dialysis 3 times a week, she does notice fatigue on dialysis days.  1 day last week, she felt very fatigued after dialysis and somewhat dizzy.  She says she laid in bed most of the day and has not any recurrence of dizziness.  She does not take her usual blood pressure medications (metoprolol, torsemide, hydralazine) on dialysis days due to low blood pressures during/after dialysis.  She is currently receiving dialysis via a right upper chest dialysis catheter and is scheduled for a left arm AV fistula for hemodialysis.  She presented for preoperative testing on January 4 and had an ECG which was notable for sinus tachycardia with inferior and septal infarcts.  Patient was contacted by our cardiovascular preoperative team and  she reported some dyspnea and dizziness and therefore, surgery for today was canceled and she was set up to see me.  As above, she denies any recent chest pain or dyspnea and notes that her rehab and work with physical therapy has overall gone well.  She notes that prior to preop ECG on the fourth, she had just completed dialysis and in that setting, had not taken her usual dose of metoprolol, potentially explaining her tachycardia.  Home Medications    Prior to Admission medications   Medication Sig Start Date End Date Taking? Authorizing Provider  ALPRAZolam Duanne Moron) 0.5 MG tablet Take 0.5 mg by mouth 2 (two) times daily as needed for anxiety.     [provider]  aspirin 81 MG chewable tablet Chew 81 mg by mouth every Monday, Wednesday, and Friday.      [provider]  calcium-vitamin D (OSCAL WITH D) 500-200 MG-UNIT tablet Take 1 tablet by mouth daily.    [provider]  cetirizine (ZYRTEC) 10 MG tablet Take 10 mg by mouth daily.    [provider]  clopidogrel (PLAVIX) 75 MG tablet Take 75 mg by mouth daily. 04/29/20 04/29/21  [provider]  Cyanocobalamin 1000 MCG SUBL Take 1,000 mcg by mouth daily.     [provider]  gabapentin (NEURONTIN) 100 MG capsule Take 100 mg by mouth 2 (two) times daily. 09/23/12   Love, Ivan Anchors, PA-C  glimepiride (AMARYL) 4 MG tablet Take 4 mg by mouth daily with breakfast.  Patient not taking: Reported on 08/20/2020    [provider]  hydrALAZINE (APRESOLINE) 25 MG tablet Take 25 mg by mouth 3 (three) times daily.    [provider]  HYDROcodone-acetaminophen (NORCO/VICODIN) 5-325 MG tablet Take 1 tablet by mouth every 6 (six) hours as needed for moderate pain.    [provider]  levothyroxine (SYNTHROID, LEVOTHROID) 150 MCG tablet Take 150 mcg daily before breakfast by mouth.    [provider]  metoprolol tartrate (LOPRESSOR) 50 MG tablet Take 50 mg by mouth in the morning, at noon, and at bedtime.    [provider]  torsemide (DEMADEX) 100 MG tablet Take 1 tablet (100 mg total) by mouth daily. 08/12/20   Jennye Boroughs, MD    Review of Systems    She continues to have leg muscle weakness in the setting of deconditioning but notes that this has improved significantly since November and she is now able to walk on her own with a walker.  She continues to work with physical therapy.  She denies chest pain or dyspnea.  She does experience fatigue and sometimes lightheadedness after dialysis.  She denies palpitations, PND, orthopnea, syncope, edema, or early satiety.  All other systems reviewed and are otherwise negative except as noted above.  Physical Exam    VS:  BP (!) 110/58   Pulse 94   Ht 5\' 4"  (1.626 m)   Wt  192 lb (87.1 kg)   BMI 32.96 kg/m  , BMI Body mass index is 32.96 kg/m. GEN: Obese, in no acute distress. HEENT: normal. Neck: Supple, no JVD, carotid bruits, or masses. Cardiac: RRR, no murmurs, rubs, or gallops. No clubbing, cyanosis.  Trace bilateral ankle edema.  The left lower leg is wrapped.  Radials 2+ and equal bilaterally.  Respiratory:  Respirations regular and unlabored, clear to auscultation bilaterally. GI: Soft, nontender, nondistended, BS + x 4. MS: no deformity or atrophy. Skin: warm and dry, no rash. Neuro:  Strength and sensation are intact. Psych: Normal affect.  Accessory Clinical Findings    ECG personally reviewed by me today -regular sinus rhythm, 94, left axis deviation, prior inferior infarct, poor R wave progression/question prior anterolateral infarct, LVH.  ECG similar to July 06, 2020 ECG - no acute changes.  Lab Results  Component Value Date   WBC 4.7 08/07/2020   HGB 7.4 (L) 08/07/2020   HCT 23.9 (L) 08/07/2020   MCV 96.4 08/07/2020   PLT 92 (L) 08/07/2020   Lab Results  Component Value Date   CREATININE 6.31 (H) 08/10/2020   BUN 58 (H) 08/10/2020   NA 134 (L) 08/10/2020   K 4.3 08/10/2020   CL 96 (L) 08/10/2020   CO2 27 08/10/2020   Lab Results  Component Value Date   ALT 7 08/03/2020   AST 12 (L) 08/03/2020   ALKPHOS 68 08/03/2020   BILITOT 0.8 08/03/2020   Lab Results  Component Value Date   CHOL 154 09/09/2012   HDL 31 (L) 09/09/2012   LDLCALC 92 09/09/2012   TRIG 157 (H) 09/09/2012   CHOLHDL 5.0 09/09/2012    Lab Results  Component Value Date   HGBA1C 5.4 06/23/2020    Assessment & Plan    1.  Coronary artery disease/preoperative cardiovascular examination: Patient with a history of CAD status post four-vessel bypass in December 2013.  She is scheduled for left upper extremity AV fistula for dialysis access.  She presented for preoperative evaluation ECG on January 4 and reported symptoms of dizziness and dyspnea.  There  was concern for acute ECG changes therefore, surgery was canceled and she was scheduled for today.  She notes that she has been [place] well since November and has been working with physical therapy without experiencing chest pain or dyspnea.  She does experience fatigue on dialysis days, and sometimes notes lightheadedness as well.  ECG today is similar in appearance to her October ECG.  I suspect sinus tachycardia the other day was secondary to dialysis earlier that morning and also she had not yet taken her beta-blocker.  Given prior history, her risk of a cardiovascular event surrounding surgery is approximately 11%.  Though her activity is limited at home, she is able to achieve 4 METS.  In the absence of symptoms with otherwise stable findings, she may proceed with surgery without any additional ischemic testing at this time.  She should remain on aspirin and beta-blocker therapy throughout the perioperative period.  2.  Essential hypertension: Stable.  3.  Hyperlipidemia: Statin intolerant and previously declined alternate agents.  4.  End-stage renal disease on hemodialysis: She does fatigue easily on dialysis days but otherwise has been stable.  Pending left upper extremity AV fistula as above.  5.  Deconditioning: She continues work with physical therapy and notes that she tolerates this well.  She was able to walk on her own with a walker.  She plans to continue to work with physical therapy.  6.  Type 2 diabetes mellitus: A1c 5.4 in September.  She is on Amaryl therapy.  7.  Disposition: Follow-up with Dr. Rockey Situ in 6 months or sooner if necessary.  Murray Hodgkins, NP 10/07/2020, 12:43 PM

## 2020-10-12 ENCOUNTER — Encounter: Payer: Medicare Other | Attending: Physician Assistant | Admitting: Physician Assistant

## 2020-10-12 ENCOUNTER — Other Ambulatory Visit: Payer: Self-pay

## 2020-10-12 DIAGNOSIS — Z992 Dependence on renal dialysis: Secondary | ICD-10-CM | POA: Diagnosis not present

## 2020-10-12 DIAGNOSIS — E1122 Type 2 diabetes mellitus with diabetic chronic kidney disease: Secondary | ICD-10-CM | POA: Insufficient documentation

## 2020-10-12 DIAGNOSIS — E11622 Type 2 diabetes mellitus with other skin ulcer: Secondary | ICD-10-CM | POA: Diagnosis not present

## 2020-10-12 DIAGNOSIS — I120 Hypertensive chronic kidney disease with stage 5 chronic kidney disease or end stage renal disease: Secondary | ICD-10-CM | POA: Diagnosis not present

## 2020-10-12 DIAGNOSIS — S9031XA Contusion of right foot, initial encounter: Secondary | ICD-10-CM | POA: Diagnosis not present

## 2020-10-12 DIAGNOSIS — L97811 Non-pressure chronic ulcer of other part of right lower leg limited to breakdown of skin: Secondary | ICD-10-CM | POA: Insufficient documentation

## 2020-10-12 DIAGNOSIS — Z6835 Body mass index (BMI) 35.0-35.9, adult: Secondary | ICD-10-CM | POA: Diagnosis not present

## 2020-10-12 DIAGNOSIS — L97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 with necrosis of muscle: Secondary | ICD-10-CM | POA: Insufficient documentation

## 2020-10-12 DIAGNOSIS — N185 Chronic kidney disease, stage 5: Secondary | ICD-10-CM | POA: Insufficient documentation

## 2020-10-12 DIAGNOSIS — S8012XA Contusion of left lower leg, initial encounter: Secondary | ICD-10-CM | POA: Diagnosis not present

## 2020-10-12 DIAGNOSIS — E11621 Type 2 diabetes mellitus with foot ulcer: Secondary | ICD-10-CM | POA: Insufficient documentation

## 2020-10-12 DIAGNOSIS — S81812A Laceration without foreign body, left lower leg, initial encounter: Secondary | ICD-10-CM | POA: Diagnosis not present

## 2020-10-12 DIAGNOSIS — E669 Obesity, unspecified: Secondary | ICD-10-CM | POA: Insufficient documentation

## 2020-10-12 NOTE — Progress Notes (Signed)
ROMELLE, MULDOON ([id number]) Visit Report for 10/12/2020 Arrival Information Details [name], [name] Date of Service: 10/12/2020 9:15 AM Medical Record Number: [id number] Patient Account Number: [id number] Date of Birth/Sex: 02-28-1934 (86 y.o. F) Treating RN: Cornell Barman Primary Care Milica Gully: Fulton Reek Other Clinician: Referring Sahar Ryback: Fulton Reek Treating Rutha Melgoza/Extender: Skipper Cliche in Treatment: 12 Visit Information History Since Last Visit Added or deleted any medications: No Patient Arrived: Wheel Chair Any new allergies or adverse reactions: No Arrival Time: 09:21 Had a fall or experienced change in No Accompanied By: husband activities of daily living that may affect Transfer Assistance: None risk of falls: Patient Identification Verified: Yes Signs or symptoms of abuse/neglect since last visito No Secondary Verification Process Completed: Yes Hospitalized since last visit: No Patient Requires Transmission-Based Precautions: No Implantable device outside of the clinic excluding No Patient Has Alerts: No cellular tissue based products placed in the center since last visit: Has Dressing in Place as Prescribed: Yes Pain Present Now: No Electronic Signature(s) Signed: 10/12/2020 4:43:31 PM By: Lorine Bears RCP, RRT, CHT Entered By: Lorine Bears on 10/12/2020 09:22:21 [name] ([id number]) -------------------------------------------------------------------------------- Compression Therapy Details [name] Date of Service: 10/12/2020 9:15 AM Medical Record Number: [id number] Patient Account Number: [id number] Date of Birth/Sex: Sep 16, 1934 (86 y.o. F) Treating RN: Dolan Amen Primary Care Alante Tolan: Fulton Reek Other Clinician: Referring Sharea Guinther: Fulton Reek Treating Linsey Arteaga/Extender: Skipper Cliche in Treatment: 12 Compression Therapy Performed for  Wound Assessment: Wound #1 Right,Dorsal Foot Performed By: Cora Daniels, RN Compression Type: Three Layer Pre Treatment ABI: 1 Post Procedure Diagnosis Same as Pre-procedure Electronic Signature(s) Signed: 10/12/2020 12:18:16 PM By: Georges Mouse, Minus Breeding RN Entered By: Georges Mouse, Minus Breeding on 10/12/2020 10:01:22 [name] ([id number]) -------------------------------------------------------------------------------- Encounter Discharge Information Details [name] Date of Service: 10/12/2020 9:15 AM Medical Record Number: [id number] Patient Account Number: [id number] Date of Birth/Sex: [birth year] (86 y.o. F) Treating RN: Dolan Amen Primary Care Camiah Humm: Fulton Reek Other Clinician: Referring Delonta Yohannes: Fulton Reek Treating Mazy Culton/Extender: Skipper Cliche in Treatment: 12 Encounter Discharge Information Items Discharge Condition: Stable Ambulatory Status: Wheelchair Discharge Destination: Home Transportation: Private Auto Accompanied By: husband Schedule Follow-up Appointment: Yes Clinical Summary of Care: Electronic Signature(s) Signed: 10/12/2020 12:18:16 PM By: Georges Mouse, Minus Breeding RN Entered By: Georges Mouse, Minus Breeding on 10/12/2020 10:22:47 [name] ([id number]) -------------------------------------------------------------------------------- Lower Extremity Assessment Details [name] Date of Service: 10/12/2020 9:15 AM Medical Record Number: [id number] Patient Account Number: [id number] Date of Birth/Sex: [birth year] (86 y.o. F) Treating RN: Dolan Amen Primary Care Osvaldo Lamping: Fulton Reek Other Clinician: Referring Asim Gersten: Fulton Reek Treating Colonel Krauser/Extender: Skipper Cliche in Treatment: 12 Edema Assessment Assessed: Shirlyn Goltz: Yes] Patrice Paradise: Yes] Edema: [Left: N] [Right: o] Calf Left: Right: Point of Measurement: 31 cm From Medial Instep 34.7 cm 40  cm Ankle Left: Right: Point of Measurement: 11 cm From Medial Instep 22.2 cm 23.2 cm Vascular Assessment Pulses: Dorsalis Pedis Palpable: [Left:Yes] [Right:Yes] Electronic Signature(s) Signed: 10/12/2020 12:18:16 PM By: Georges Mouse, Minus Breeding RN Entered By: Georges Mouse, Kenia on 10/12/2020 09:48:37 [name] ([id number]) -------------------------------------------------------------------------------- Multi Wound Chart Details [name] Date of Service: 10/12/2020 9:15 AM Medical Record Number: [id number] Patient Account Number: [id number] Date of Birth/Sex: 1934/01/15 (86 y.o. 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 Comorbid History: Congestive Heart Failure, N/A N/A Hypertension, Type II Diabetes, Received Radiation Date Acquired: 07/02/2020 08/30/2020 08/30/2020 Weeks of Treatment: 12 5 5  Wound Status: Open Healed - Epithelialized Healed - Epithelialized Measurements L x W x D (cm) 0.3x0.3x0.4 0x0x0 0x0x0 Area (cm) : 0.071 0 0 Volume (cm) : 0.028 0 0 % Reduction in Area: 99.70% 100.00% 100.00% % Reduction in Volume: 98.90% 100.00% 100.00% Starting Position 1 (o'clock): 10 Ending Position 1 (o'clock): 12 Maximum Distance 1 (cm): 1.1 Undermining: Yes N/A N/A Classification: Grade 2 Full Thickness Without Exposed Full Thickness Without Exposed Support  Structures Support Structures Exudate Amount: Medium N/A N/A Exudate Type: Serosanguineous N/A N/A Exudate Color: red, brown N/A N/A Wound Margin: Flat and Intact N/A N/A Granulation Amount: Large (67-100%) N/A N/A Granulation Quality: Red N/A N/A Necrotic Amount: None Present (0%) N/A N/A Exposed Structures: Fat Layer (Subcutaneous Tissue): N/A N/A Yes Fascia: No Tendon: No Muscle: No Joint: No Bone: No Epithelialization: Large (67-100%) N/A N/A Treatment Notes Electronic Signature(s) AUBRIE, LUCIEN ([id number]) Signed: 10/12/2020 12:18:16 PM By: Georges Mouse, Minus Breeding RN Entered By: Georges Mouse, Minus Breeding on 10/12/2020 10:00:53 [name] ([id number]) -------------------------------------------------------------------------------- [place] Details [name] Date of Service: 10/12/2020 9:15 AM Medical Record Number: [id number] Patient Account Number: [id number] Date of Birth/Sex: [birth year] (86 y.o. F) Treating RN: Dolan Amen Primary Care Jaxan Michel: Fulton Reek Other Clinician: Referring Jasline Buskirk: Fulton Reek Treating Kaelin Bonelli/Extender: Skipper Cliche in Treatment: 12 Active Inactive Necrotic Tissue Nursing Diagnoses: Impaired tissue integrity related to necrotic/devitalized tissue Goals: Necrotic/devitalized tissue will be minimized in the wound bed Date Initiated: 07/14/2020 Target Resolution Date: 09/29/2020 Goal Status: Active Interventions: Assess patient pain level pre-, during and post procedure and prior to discharge Treatment Activities: Apply topical anesthetic as ordered : 07/14/2020 Notes: Electronic Signature(s) Signed: 10/12/2020 12:18:16 PM By: Georges Mouse, Minus Breeding RN Entered By: Georges Mouse, Minus Breeding on 10/12/2020 10:00:45 [name] ([id number]) -------------------------------------------------------------------------------- Pain Assessment Details Patient Name:  [name] Date of Service: 10/12/2020 9:15 AM Medical Record Number: [id number] Patient Account Number: [id number] Date of Birth/Sex: 1933-10-25 (86 y.o. F) Treating RN: Dolan Amen Primary Care Glenice Ciccone: Fulton Reek Other Clinician: Referring Maston Wight: Fulton Reek Treating Norvell Caswell/Extender: Skipper Cliche in Treatment: 12 Active Problems Location of Pain Severity and Description of Pain Patient Has Paino No Site Locations Rate the pain. Current Pain Level: 0 Pain Management and Medication Current Pain Management: Electronic Signature(s) Signed: 10/12/2020 12:18:16 PM By: Georges Mouse, Minus Breeding RN Entered By: Georges Mouse, Kenia on 10/12/2020 09:28:20 [name] ([id number]) -------------------------------------------------------------------------------- Patient/Caregiver Education Details [name] Date of Service: 10/12/2020 9:15 AM Medical Record Number: [id number] Patient Account Number: [id number] Date of Birth/Gender: 1934-01-25 (86 y.o. F) Treating RN: Dolan Amen Primary Care Physician: Fulton Reek Other Clinician: Referring Physician: Fulton Reek Treating Physician/Extender: Skipper Cliche in Treatment: 12 Education Assessment Education Provided To: Patient Education Topics Provided Venous: Handouts: Controlling Swelling with Multilayered Compression Wraps Methods: Demonstration, Explain/Verbal Responses: State content correctly Wound/Skin Impairment: Handouts: Caring for Your Ulcer Methods: Demonstration, Explain/Verbal Responses: State content correctly Electronic Signature(s) Signed: 10/12/2020 12:18:16 PM By: Georges Mouse, Minus Breeding RN Entered By: Georges Mouse, Kenia on 10/12/2020 10:20:45 [name] ([id number]) --------------------------------------------------------------------------------  Wound Assessment Details [name], [name] Date of Service: 10/12/2020  9:15 AM Medical Record Number: [id number] Patient Account Number: [id number] Date of Birth/Sex: 08-Jul-1934 (86 y.o. F) Treating RN: Dolan Amen Primary Care Alyssha Housh: Fulton Reek Other Clinician: Referring Clevie Prout: Fulton Reek Treating Sona Nations/Extender: Skipper Cliche in Treatment: 12 Wound Status Wound Number: 1 Primary Diabetic Wound/Ulcer of the Lower Extremity Etiology: Wound Location: Right, Dorsal Foot Wound Status: Open Wounding Event: Blister Comorbid Congestive Heart Failure, Hypertension, Type II Date Acquired: 07/02/2020 History: Diabetes, Received Radiation Weeks Of Treatment: 12 Clustered Wound: No Photos Wound Measurements Length: (cm) 0.3 % Reduc Width: (cm) 0.3 % Reduc Depth: (cm) 0.4 Epithel Area: (cm) 0.071 Underm Volume: (cm) 0.028 Sta Endi Maxi tion in Area: 99.7% tion in Volume: 98.9% ialization: Large (67-100%) ining: Yes rting Position (o'clock): 10 ng Position (o'clock): 12 mum Distance: (cm) 1.1 Wound Description Classification: Grade 2 Foul O Wound Margin: Flat and Intact Slough Exudate Amount: Medium Exudate Type: Serosanguineous Exudate Color: red, brown dor After Cleansing: No /Fibrino Yes Wound Bed Granulation Amount: Large (67-100%) Exposed Structure Granulation Quality: Red Fascia Exposed: No Necrotic Amount: None Present (0%) Fat Layer (Subcutaneous Tissue) Exposed: Yes Tendon Exposed: No Muscle Exposed: No Joint Exposed: No Bone Exposed: No Treatment Notes Wound #1 (Foot) Wound Laterality: Dorsal, Right Cleanser Hylton, Natsha T. ([id number]) Peri-Wound Care Topical Primary Dressing Endoform 2x2 (in/in) Quantity: 1 Discharge Instruction: Apply Endoform as directed Secondary Dressing ABD Pad 5x9 (in/in) Quantity: 1 Discharge Instruction: Cover with ABD pad Secured With Compression Wrap Profore Lite LF 3 Multilayer Compression Bandaging System Quantity: 1 Discharge Instruction: Apply 3 multi-layer  wrap as prescribed. Unna w/Zinc, 4x10 (in/yd) Quantity: 1 Discharge Instruction: Apply unna paste -3 finger-widths below knee to anchor compression wrap in place. Compression Stockings Add-Ons Electronic Signature(s) Signed: 10/12/2020 9:51:57 AM By: Georges Mouse, Minus Breeding RN Entered By: Georges Mouse, Kenia on 10/12/2020 09:51:56 [name] ([id number]) -------------------------------------------------------------------------------- Wound Assessment Details [name] Date of Service: 10/12/2020 9:15 AM Medical Record Number: [id number] Patient Account Number: [id number] Date of Birth/Sex: 09-15-1934 (86 y.o. F) Treating RN: Dolan Amen Primary Care Nadirah Socorro: Fulton Reek Other Clinician: Referring Janece Laidlaw: Fulton Reek Treating Burdette Forehand/Extender: Skipper Cliche in Treatment: 12 Wound Status Wound Number: 2 Primary Etiology: Skin Tear Wound Location: Left, Anterior Lower Leg Wound Status: Healed - Epithelialized Wounding Event: Skin Tear/Laceration Date Acquired: 08/30/2020 Weeks Of Treatment: 5 Clustered Wound: No Wound Measurements Length: (cm) Width: (cm) Depth: (cm) Area: (cm) Volume: (cm) 0 % Reduction in Area: 100% 0 % Reduction in Volume: 100% 0 0 0 Wound Description Classification: Full Thickness Without Exposed Support Structu res Treatment Notes Wound #2 (Lower Leg) Wound Laterality: Left, Anterior Cleanser Peri-Wound Care Topical Primary Dressing Secondary Dressing Secured With Compression Wrap Compression Stockings Add-Ons Electronic Signature(s) Signed: 10/12/2020 12:18:16 PM By: Georges Mouse, Minus Breeding RN Entered By: Georges Mouse, Kenia on 10/12/2020 09:38:12 [name] ([id number]) -------------------------------------------------------------------------------- Wound Assessment Details [name] Date of Service: 10/12/2020 9:15 AM Medical Record Number:  [id number] Patient Account Number: [id number] Date of Birth/Sex: 1934-08-23 (86 y.o. F) Treating RN: Dolan Amen Primary Care Tahji [place]: Fulton Reek Other Clinician: Referring Shray Hunley: Fulton Reek Treating Jdyn Parkerson/Extender: Skipper Cliche in Treatment: 12 Wound Status Wound Number: 3 Primary Etiology: Skin Tear Wound Location: Right, Lateral Lower Leg Wound Status: Healed - Epithelialized Wounding Event: Skin Tear/Laceration Date Acquired: 08/30/2020 Weeks Of Treatment: 5 Clustered Wound: No Wound Measurements Length: (cm) Width: (cm) Depth: (cm) Area: (cm)  Volume: (cm) 0 % Reduction in Area: 100% 0 % Reduction in Volume: 100% 0 0 0 Wound Description Classification: Full Thickness Without Exposed Support Structu res Treatment Notes Wound #3 (Lower Leg) Wound Laterality: Right, Lateral Cleanser Peri-Wound Care Topical Primary Dressing Secondary Dressing Secured With Compression Wrap Compression Stockings Add-Ons Electronic Signature(s) Signed: 10/12/2020 12:18:16 PM By: Georges Mouse, Minus Breeding RN Entered By: Georges Mouse, Minus Breeding on 10/12/2020 09:43:22 [name] ([id number]) -------------------------------------------------------------------------------- Vitals Details [name] Date of Service: 10/12/2020 9:15 AM Medical Record Number: [id number] Patient Account Number: [id number] Date of Birth/Sex: 1933-12-28 (86 y.o. F) Treating RN: Cornell Barman Primary Care Genean Adamski: Fulton Reek Other Clinician: Referring Creasie Lacosse: Fulton Reek Treating Ansh Fauble/Extender: Skipper Cliche in Treatment: 12 Vital Signs Time Taken: 09:20 Temperature (F): 98.3 Height (in): 64 Pulse (bpm): 94 Weight (lbs): 205 Respiratory Rate (breaths/min): 18 Body Mass Index (BMI): 35.2 Blood Pressure (mmHg): 108/74 Reference Range: 80 - 120 mg / dl Electronic Signature(s) Signed: 10/12/2020 4:43:31 PM By: Lorine Bears RCP, RRT, CHT Entered By: Lorine Bears on 10/12/2020 09:23:10

## 2020-10-12 NOTE — Progress Notes (Addendum)
[name], [name] ([id number]) Visit Report for 10/12/2020 Chief Complaint Document Details [name], [name] Date of Service: 10/12/2020 9:15 AM Medical Record Number: [id number] Patient Account Number: [id number] Date of Birth/Sex: [birth year] (87 y.o. F) Treating RN: Cornell Barman Primary Care Provider: Fulton Reek Other Clinician: Referring Provider: Fulton Reek Treating Provider/Extender: Skipper Cliche in Treatment: 12 Information Obtained from: Patient Chief Complaint 07/14/2020; patient sent over here for a hemorrhagic blister on the right dorsal foot by her primary doctor Dr. Doy Hutching at [place] [facility]. Electronic Signature(s) Signed: 10/12/2020 9:27:30 AM By: Worthy Keeler PA-C Entered By: Worthy Keeler on 10/12/2020 09:27:29 [name] ([id number]) -------------------------------------------------------------------------------- HPI Details [name] Date of Service: 10/12/2020 9:15 AM Medical Record Number: [id number] Patient Account Number: [id number] Date of Birth/Sex: Jun 17, 1934 (87 y.o. F) Treating RN: Cornell Barman Primary Care Provider: Fulton Reek Other Clinician: Referring Provider: Fulton Reek Treating Provider/Extender: Skipper Cliche in Treatment: 12 History of Present Illness HPI Description: ADMISSION 07/14/2020. This is a medically complex 87 year old woman who is sent over urgently from her primary care doctor Dr. Doy Hutching at [place] [facility] and we worked her in this afternoon. She is apparently developed a blister on the dorsal foot sometime over the last 2 weeks on the right which seems to have developed a hemorrhagic component to this. The history here is somewhat vague but this is happened since she left the hospital on 07/02/2020. She also has a blistered area on the left dorsal foot although this does not have a hemorrhagic component to it and it is not threatened to open where is the right  side is definitely leaking fluid and could easily break down. I do not believe she has been doing anything specific to this. With regards to her medical history I have reviewed the discharge summary from the hospital from 06/22/2020 through 07/02/2020. 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 She was given prednisone but was not felt to be a candidate for immunosuppression secondary to her age and comorbidities. She is ANA positive p-ANCA positive. She had a right IJ catheter placed on 9/24 and is being done on dialysis. She is a diabetic. She takes prednisone 20 mg twice daily. She was on aspirin and Plavix although I think these have been put on hold. Past medical history includes; pauci-immune crescentic glomerulonephritis. History of peripheral edema and type 2 diabetes left carotid artery stenosis with a history of a CVA, coronary artery disease and hypertension I cannot see any arterial evaluation in her record in [place] link. We could not do arterial ABIs on her because of complaints of discomfort 10/20; 1 week follow-up. This is a patient who is a diabetic. She recently developed acute renal failure because of pauci-immune glomerulonephritis. Sometime during her initial hospitalization she developed a wound on her right foot tremendous swelling in both legs. She was sent over here urgently last week by her primary care physician with what looks to be hematoma still contained on the right dorsal foot. We put silver alginate on this and put her in compression to control some of the swelling. The left leg had a similar appearance with severe edema in the left dorsal foot so we put that under compression as well. When she arrived back for nurse follow-up on Friday there was some drainage coming out of the wound which our nurse cultured. This is come back showing Pseudomonas Enterococcus faecalis and staph  aureus although the identification of the staph aureus is not complete. I empirically put her on cefdinir 300 mg after dialysis on Tuesday Thursday and Saturday she is only had one dose. She has not been systemically unwell 10/27 1 week follow-up. With considerable help of her nursing staff I was finally able to speak to her nephrologist who manages her at dialysis. Started her on vancomycin and ceftazidime. This to cover the combination of methicillin sensitive staph aureus Enterococcus faecalis and Pseudomonas. Although the staph aureus was methicillin sensitive and the Enterococcus ampicillin sensitive vancomycin provided the best alternative here that can be given at dialysis and ceftazidime to cover the Pseudomonas. She had a traumatic wound on the top of her foot and a complicated abscess. She has significant tissue destruction from this although the wound is looking a lot better today. We are using silver alginate to the surface. X-ray of the foot did not show any deep obvious infection or osteomyelitis 12/1; since the patient was last here she was hospitalized from 11/3 through 11/10 with coag negative staphylococcal bacteremia. I do not know the the exact cause of this was determined. She was seen by vascular and they replaced her permacath on the right side of her neck. Her repeat blood cultures were negative. She is still on IV vancomycin ando Ceftazidime at dialysis. She generally feels systemically well she does not have a fever she is still receiving dialysis but reports she voids up to 8 times a day She has home health changing the dressing I believe they are using silver alginate under 3 layer compression. In addition to the large wound on her right anterior foot that we initially saw her for she has apparently a right lateral leg wound that she suffered during a fall and 2 skin tears on the left anterior lower leg 12/8; patient comes in looking a lot better this week. One of the 2  skin tears she has on the left leg is healed the other is smaller. The area on the right lateral leg looks improved and her original wound on the right dorsal foot is a lot smaller and looks a lot better. We used Hydrofera Blue to all wound areas under 3 layer compression.. Apparently home health had some trouble with the compression wraps. 12/15 patient comes in with all of her wounds smaller including her original deep punched out wound on the right dorsal foot as well as the skin tears on the right posterior lateral calf and the left anterior lower leg. We have been using Hydrofera Blue under compression excellent results 10/12/2020 upon evaluation today patient appears to be doing decently well currently in regard to her foot ulcer. She does have a small hole still open with some tunneling that really goes proximally in the foot at around the 12:00 location. There is really not significant undermining other locations. This appears to be a small area of where there was a little fluid trapped. Nonetheless I do feel like this is still doing quite well and in general I think that she is going to benefit from possibly packing into this area with endoform in order to keep the area open while it granulates in. Electronic Signature(s) Signed: 10/12/2020 10:22:20 AM By: Worthy Keeler PA-C Entered By: Worthy Keeler on 10/12/2020 10:22:20 [name] ([id number]) -------------------------------------------------------------------------------- Physical Exam Details [name] Date of Service: 10/12/2020 9:15 AM Medical Record Number: [id number] Patient Account Number: [id number] Date of Birth/Sex: 12-Dec-1933 (87 y.o. F)  Treating RN: Cornell Barman Primary Care Provider: Fulton Reek Other Clinician: Referring Provider: Fulton Reek Treating Provider/Extender: Skipper Cliche in Treatment: 12 Constitutional Obese and well-hydrated in no acute  distress. Respiratory normal breathing without difficulty. Psychiatric this patient is able to make decisions and demonstrates good insight into disease process. Alert and Oriented x 3. pleasant and cooperative. Notes Upon inspection patient's wound bed actually showed signs of good granulation at this time. There does not appear to be any evidence of active infection which is great news. With that being said I do believe packing with endoform into the area of wound/tunneling would be beneficial for the patient in order to allow this to hopefully granulate in from the inside out. The patient is in agreement with this plan Electronic Signature(s) Signed: 10/12/2020 10:23:03 AM By: Worthy Keeler PA-C Entered By: Worthy Keeler on 10/12/2020 10:23:01 [name] ([id number]) -------------------------------------------------------------------------------- Physician Orders Details [name] Date of Service: 10/12/2020 9:15 AM Medical Record Number: [id number] Patient Account Number: [id number] Date of Birth/Sex: [birth year] (87 y.o. 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 [place]: - [place] for wound care. May utilize formulary  equivalent dressing for wound treatment orders unless otherwise specified. Home Health Nurse may visit PRN to address patientos wound care needs. o DISCONTINUE Home Health for Wound Care. o Scheduled days for dressing changes to be completed; exception, patient has scheduled wound care visit that day. o **Please direct any NON-WOUND related issues/requests for orders to patient's Primary Care Physician. **If current dressing causes regression in wound condition, may D/C ordered dressing product/s and apply Normal Saline Moist Dressing daily until next [place] or Other MD appointment. **Notify [facility]aling [facility] of regression in wound condition at [phone]. Bathing/ Shower/ Hygiene o May shower with wound dressing protected with water repellent cover or cast protector. Edema Control - Lymphedema / Segmental Compressive Device / Other o Optional: One layer of unna paste to top of compression wrap (to act as an anchor). o Elevate legs to the level of the heart and pump ankles as often as possible o Elevate leg(s) parallel to the floor when sitting. Additional Orders / Instructions o Follow Nutritious Diet and Increase Protein Intake Wound Treatment Wound #1 - Foot Wound Laterality: Dorsal, Right Primary Dressing: Endoform 2x2 (in/in) Discharge Instructions: Apply Endoform as directed Secondary Dressing: ABD Pad 5x9 (in/in) Discharge Instructions: Cover with ABD pad Compression Wrap: Profore Lite LF 3 Multilayer Compression Bandaging System Discharge Instructions: Apply 3 multi-layer wrap as prescribed. Compression Wrap: Unna w/Zinc, 4x10 (in/yd) Discharge Instructions: Apply unna paste -3 finger-widths below knee to anchor compression wrap in place. Electronic Signature(s) Signed: 10/12/2020 12:18:16 PM By: Georges Mouse, Minus Breeding RN Signed: 10/12/2020 4:32:22 PM By: Gerlene Fee, Jamyia T. ([id number]) Entered By: Georges Mouse, Minus Breeding on  10/12/2020 10:19:35 [name] ([id number]) -------------------------------------------------------------------------------- Problem List  Details Patient Name: GRACI, HULCE Date of Service: 10/12/2020 9:15 AM Medical Record Number: [id number] Patient Account Number: [id number] Date of Birth/Sex: [birth year] (87 y.o. F) Treating RN: Cornell Barman Primary Care Provider: Fulton Reek Other Clinician: Referring Provider: Fulton Reek Treating Provider/Extender: Skipper Cliche in Treatment: 12 Active Problems ICD-10 Encounter Code Description Active Date MDM Diagnosis S90.31XD Contusion of right foot, subsequent encounter 07/14/2020 No Yes L97.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 with necrosis of 07/21/2020 No Yes muscle L97.811 Non-pressure chronic ulcer of other part of right lower leg limited to 09/01/2020 No Yes breakdown of skin S80.12XD Contusion of left lower leg, subsequent encounter 09/01/2020 No Yes E11.22 Type 2 diabetes mellitus with diabetic chronic kidney disease 07/14/2020 No Yes N18.5 Chronic kidney disease, stage 5 07/14/2020 No Yes Inactive Problems ICD-10 Code Description Active Date Inactive Date S90.822D Blister (nonthermal), left foot, subsequent encounter 07/14/2020 07/14/2020 Resolved Problems Electronic Signature(s) Signed: 10/12/2020 9:27:22 AM By: Worthy Keeler PA-C Entered By: Worthy Keeler on 10/12/2020 09:27:22 [name] ([id number]) -------------------------------------------------------------------------------- Progress Note Details [name] Date of Service: 10/12/2020 9:15 AM Medical Record Number: [id number] Patient Account Number: [id number] Date of Birth/Sex: [birth year] (87 y.o. F) Treating RN: Cornell Barman Primary Care Provider: Fulton Reek Other Clinician: Referring Provider: Fulton Reek Treating Provider/Extender: Skipper Cliche in Treatment:  12 Subjective Chief Complaint Information obtained from Patient 07/14/2020; patient sent over here for a hemorrhagic blister on the right dorsal foot by her primary doctor Dr. Doy Hutching at [facility]. History of Present Illness (HPI) ADMISSION 07/14/2020. This is a medically complex 87 year old woman who is sent over urgently from her primary care doctor Dr. Doy Hutching at [place] and we worked her in this afternoon. She is apparently developed a blister on the dorsal foot sometime over the last 2 weeks on the right which seems to have developed a hemorrhagic component to this. The history here is somewhat vague but this is happened since she left the hospital on 07/02/2020. She also has a blistered area on the left dorsal foot although this does not have a hemorrhagic component to it and it is not threatened to open where is the right side is definitely leaking fluid and could easily break down. I do not believe she has been doing anything specific to this. With regards to her medical history I have reviewed the discharge summary from the hospital from 06/22/2020 through 07/02/2020. 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 She was given prednisone but was not felt to be a candidate for immunosuppression secondary to her age and comorbidities. She is ANA positive p-ANCA positive. She had a right IJ catheter placed on 9/24 and is being done on dialysis. She is a diabetic. She takes prednisone 20 mg twice daily. She was on aspirin and Plavix although I think these have been put on hold. Past medical history includes; pauci-immune crescentic glomerulonephritis. History of peripheral edema and type 2 diabetes left carotid artery stenosis with a history of a CVA, coronary artery disease and hypertension I cannot see any arterial evaluation in her record in [place] link. We could not do arterial ABIs on her  because of complaints of discomfort 10/20; 1 week follow-up. This is a patient who is a diabetic. She recently developed acute renal failure because of pauci-immune glomerulonephritis. Sometime during her initial hospitalization she developed a wound on her right foot tremendous swelling in  both legs. She was sent over here urgently last week by her primary care physician with what looks to be hematoma still contained on the right dorsal foot. We put silver alginate on this and put her in compression to control some of the swelling. The left leg had a similar appearance with severe edema in the left dorsal foot so we put that under compression as well. When she arrived back for nurse follow-up on Friday there was some drainage coming out of the wound which our nurse cultured. This is come back showing Pseudomonas Enterococcus faecalis and staph aureus although the identification of the staph aureus is not complete. I empirically put her on cefdinir 300 mg after dialysis on Tuesday Thursday and Saturday she is only had one dose. She has not been systemically unwell 10/27 1 week follow-up. With considerable help of her nursing staff I was finally able to speak to her nephrologist who manages her at dialysis. Started her on vancomycin and ceftazidime. This to cover the combination of methicillin sensitive staph aureus Enterococcus faecalis and Pseudomonas. Although the staph aureus was methicillin sensitive and the Enterococcus ampicillin sensitive vancomycin provided the best alternative here that can be given at dialysis and ceftazidime to cover the Pseudomonas. She had a traumatic wound on the top of her foot and a complicated abscess. She has significant tissue destruction from this although the wound is looking a lot better today. We are using silver alginate to the surface. X-ray of the foot did not show any deep obvious infection or osteomyelitis 12/1; since the patient was last here she was  hospitalized from 11/3 through 11/10 with coag negative staphylococcal bacteremia. I do not know the the exact cause of this was determined. She was seen by vascular and they replaced her permacath on the right side of her neck. Her repeat blood cultures were negative. She is still on IV vancomycin ando Ceftazidime at dialysis. She generally feels systemically well she does not have a fever she is still receiving dialysis but reports she voids up to 8 times a day She has home health changing the dressing I believe they are using silver alginate under 3 layer compression. In addition to the large wound on her right anterior foot that we initially saw her for she has apparently a right lateral leg wound that she suffered during a fall and 2 skin tears on the left anterior lower leg 12/8; patient comes in looking a lot better this week. One of the 2 skin tears she has on the left leg is healed the other is smaller. The area on the right lateral leg looks improved and her original wound on the right dorsal foot is a lot smaller and looks a lot better. We used Hydrofera Blue to all wound areas under 3 layer compression.. Apparently home health had some trouble with the compression wraps. 12/15 patient comes in with all of her wounds smaller including her original deep punched out wound on the right dorsal foot as well as the skin tears on the right posterior lateral calf and the left anterior lower leg. We have been using Hydrofera Blue under compression excellent results 10/12/2020 upon evaluation today patient appears to be doing decently well currently in regard to her foot ulcer. She does have a small hole still open with some tunneling that really goes proximally in the foot at around the 12:00 location. There is really not significant undermining other locations. This appears to be a small area of where  there was a little fluid trapped. Nonetheless I do feel like this is still doing quite well and  in general I think that she is going to benefit from possibly packing into this area with endoform in order to keep the area open while it granulates in. [name] ([id number]) Objective Constitutional Obese and well-hydrated in no acute distress. Vitals Time Taken: 9:20 AM, Height: 64 in, Weight: 205 lbs, BMI: 35.2, Temperature: 98.3 F, Pulse: 94 bpm, Respiratory Rate: 18 breaths/min, Blood Pressure: 108/74 mmHg. Respiratory normal breathing without difficulty. Psychiatric this patient is able to make decisions and demonstrates good insight into disease process. Alert and Oriented x 3. pleasant and cooperative. General Notes: Upon inspection patient's wound bed actually showed signs of good granulation at this time. There does not appear to be any evidence of active infection which is great news. With that being said I do believe packing with endoform into the area of wound/tunneling would be beneficial for the patient in order to allow this to hopefully granulate in from the inside out. The patient is in agreement with this plan Integumentary (Hair, Skin) Wound #1 status is Open. Original cause of wound was Blister. The wound is located on the Right,Dorsal Foot. The wound measures 0.3cm length x 0.3cm width x 0.4cm depth; 0.071cm^2 area and 0.028cm^3 volume. There is Fat Layer (Subcutaneous Tissue) exposed. There is undermining starting at 10:00 and ending at 12:00 with a maximum distance of 1.1cm. There is a medium amount of serosanguineous drainage noted. The wound margin is flat and intact. There is large (67-100%) red granulation within the wound bed. There is no necrotic tissue within the wound bed. Wound #2 status is Healed - Epithelialized. Original cause of wound was Skin Tear/Laceration. The wound is located on the Left,Anterior Lower Leg. The wound measures 0cm length x 0cm width x 0cm depth; 0cm^2 area and 0cm^3 volume. Wound #3 status is Healed - Epithelialized.  Original cause of wound was Skin Tear/Laceration. The wound is located on the Right,Lateral Lower Leg. The wound measures 0cm length x 0cm width x 0cm depth; 0cm^2 area and 0cm^3 volume. Assessment Active Problems ICD-10 Contusion of right foot, subsequent encounter Non-pressure chronic ulcer of other part of right foot with necrosis of muscle Non-pressure chronic ulcer of other part of right lower leg limited to breakdown of skin Contusion of left lower leg, subsequent encounter Type 2 diabetes mellitus with diabetic chronic kidney disease Chronic kidney disease, stage 5 Procedures Wound #1 Pre-procedure diagnosis of Wound #1 is a Diabetic Wound/Ulcer of the Lower Extremity located on the Right,Dorsal Foot . There was a Three Layer Compression Therapy Procedure with a pre-treatment ABI of 1 by Dolan Amen, RN. Post procedure Diagnosis Wound #1: Same as Pre-Procedure Plan Follow-up Appointments: Return Appointment in 2 weeks. Home Health: [facility] [place]: - [place] for wound care. May utilize formulary equivalent dressing for wound treatment orders unless otherwise specified. Home [name], [name] ([id number]) Health Nurse may visit PRN to address patient s wound care needs. DISCONTINUE Home Health for Wound Care. Scheduled days for dressing changes to be completed; exception, patient has scheduled wound care visit that day. **Please direct any NON-WOUND related issues/requests for orders to patient's Primary Care Physician. **If current dressing causes regression in wound condition, may D/C ordered dressing product/s and apply Normal Saline Moist Dressing daily until next [place] or Other MD appointment. **Notify Wound [facility]aling Center of regression in wound condition at [phone]. Bathing/ Shower/ Hygiene:  May shower with wound dressing protected with water repellent cover or cast protector. Edema Control - Lymphedema / Segmental  Compressive Device / Other: Optional: One layer of unna paste to top of compression wrap (to act as an anchor). Elevate legs to the level of the heart and pump ankles as often as possible Elevate leg(s) parallel to the floor when sitting. Additional Orders / Instructions: Follow Nutritious Diet and Increase Protein Intake WOUND #1: - Foot Wound Laterality: Dorsal, Right Primary Dressing: Endoform 2x2 (in/in) Discharge Instructions: Apply Endoform as directed Secondary Dressing: ABD Pad 5x9 (in/in) Discharge Instructions: Cover with ABD pad Compression Wrap: Profore Lite LF 3 Multilayer Compression Bandaging System Discharge Instructions: Apply 3 multi-layer wrap as prescribed. Compression Wrap: Unna w/Zinc, 4x10 (in/yd) Discharge Instructions: Apply unna paste -3 finger-widths below knee to anchor compression wrap in place. 1. I am going to recommend currently that we go ahead and initiate treatment with a continuation of a 3 layer compression wrap which I do feel like has been beneficial for her. 2. I am also can recommend that we actually initiate treatment with endoform packed into the small tunnel region of the last remaining part of the open wound which is tiny overall this seems to be doing quite well. 3. Milliseconds recommend the patient continue to elevate her legs much as possible. 4. I am would recommend compression socks for the left leg we will give her an information for measurements today. We will see patient back for reevaluation in 2 weeks here in the clinic. If anything worsens or changes patient will contact our office for additional recommendations. Electronic Signature(s) Signed: 10/12/2020 10:25:45 AM By: Worthy Keeler PA-C Entered By: Worthy Keeler on 10/12/2020 10:25:45 [name] ([id number]) -------------------------------------------------------------------------------- SuperBill Details [name] Date of Service:  10/12/2020 Medical Record Number: [id number] Patient Account Number: [id number] Date of Birth/Sex: 01-01-1934 (87 y.o. F) Treating RN: Dolan Amen Primary Care Provider: Fulton Reek Other Clinician: Referring Provider: Fulton Reek Treating Provider/Extender: Skipper Cliche in Treatment: 12 Diagnosis Coding ICD-10 Codes Code Description S90.31XD Contusion of right foot, subsequent encounter L97.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 with necrosis of muscle L97.811 Non-pressure chronic ulcer of other part of right lower leg limited to breakdown of skin S80.12XD Contusion of left lower leg, subsequent encounter E11.22 Type 2 diabetes mellitus with diabetic chronic kidney disease N18.5 Chronic kidney disease, stage 5 Facility Procedures CPT4 Code: [id number] Description: (Facility Use Only) [phone] - [place] RT LEG Modifier: Quantity: 1 Physician Procedures CPT4 Code: [id number] Description: 31517 - WC PHYS LEVEL 3 - EST PT Modifier: Quantity: 1 CPT4 Code: Description: ICD-10 Diagnosis Description S90.31XD Contusion of right foot, subsequent encounter L97.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 with necrosis of mu L97.811 Non-pressure chronic ulcer of other part of right lower leg  limited to bre S80.12XD Contusion of left lower leg, subsequent encounter Modifier: scle akdown of skin Quantity: Electronic Signature(s) Signed: 10/12/2020 10:25:59 AM By: Worthy Keeler PA-C Entered By: Worthy Keeler on 10/12/2020 10:25:58

## 2020-10-15 ENCOUNTER — Telehealth (INDEPENDENT_AMBULATORY_CARE_PROVIDER_SITE_OTHER): Payer: Self-pay

## 2020-10-15 NOTE — Telephone Encounter (Signed)
I attempted to contact the patient to reschedule her surgery with Dr. Lucky Cowboy on 10/21/20. Per the patient's spouse she was at dialysis. I contacted her dialysis center and spoke with Amber and the patient has been rescheduled to 10/21/20 at the [facility]. Covid testing on 10/19/20 between 8-1 pm at the [facility]. Per [employer] I will be faxing the pre-surgical instructions to [place]

## 2020-10-19 ENCOUNTER — Other Ambulatory Visit: Payer: Self-pay

## 2020-10-19 ENCOUNTER — Other Ambulatory Visit
Admission: RE | Admit: 2020-10-19 | Discharge: 2020-10-19 | Disposition: A | Discharge status: Home / Self Care | Payer: Medicare Other | Source: Ambulatory Visit | Attending: Vascular Surgery | Admitting: Vascular Surgery

## 2020-10-19 DIAGNOSIS — Z01812 Encounter for preprocedural laboratory examination: Secondary | ICD-10-CM | POA: Insufficient documentation

## 2020-10-19 DIAGNOSIS — U071 COVID-19: Secondary | ICD-10-CM | POA: Diagnosis not present

## 2020-10-19 LAB — SARS CORONAVIRUS 2 (TAT 6-24 HRS): SARS Coronavirus 2: POSITIVE — AB

## 2020-10-20 ENCOUNTER — Telehealth: Payer: Self-pay | Admitting: Infectious Diseases

## 2020-10-20 NOTE — Progress Notes (Signed)
  [facility] Perioperative Services: Pre-Admission/Anesthesia Testing     Date: 10/20/20  Name: [name] MRN:   [id number]  Re: SARS-CoV-2 (novel coronavirus) results   Case: [id number] Date/Time: 10/21/20 1230   Procedure: ARTERIOVENOUS (AV) FISTULA CREATION (BRACHIOCEPHALIC) (Left )   Anesthesia type: General   Pre-op diagnosis: ESRD   Location: [facility] OR ROOM 07 / [place] ORS FOR ANESTHESIA GROUP   Surgeons: Algernon Huxley, MD    Patient is scheduled for the above procedure on 10/21/2020 with Dr. [name].  In preparation for her procedure, patient was tested for SARS-CoV-2 on 10/19/2020.  Results reviewed as follows:  Lab Results  Component Value Date   [place] (A) 10/19/2020    Patient is currently on hemodialysis with a monday, wednesday, and friday schedule.  She is scheduled for her dialysis treatment today.  Call placed to [facility] [employer])  to make them aware that patient is positive for SARS-CoV-2; result acknowledged by receptionist.    Spoke with OR administration Milly Jakob, RN) to advise of result and to discuss POC for this patient. RN made aware that patient's case had already been rescheduled once    [facility], RN spoke with surgeon who advised that the case will need to be rescheduled again based on the results of her current testing.    OR administrative staff reaching out to the office to have patient notified that procedure will need to be rescheduled.    Honor Loh, MSN, APRN, FNP-C, CEN [facility]  Peri-operative Services Nurse Practitioner Phone: [phone] 10/20/20 9:01 AM

## 2020-10-20 NOTE — Telephone Encounter (Signed)
Called to discuss with patient about COVID-19 symptoms and the use of one of the available treatments for those with mild to moderate Covid symptoms and at a high risk of hospitalization.  Pt appears to qualify for outpatient treatment due to co-morbid conditions and/or a member of an at-risk group in accordance with the FDA Emergency Use Authorization.    Symptom onset: 10/18/2020 Vaccinated: Yes Booster? No Immunocompromised? No Qualifiers: Age, CKD, HTN, Unvaccinated  Connected with patient via phone. She reports mild symptoms of cough and "pharyngitis". Tells me her husband is working to coordinated her dialysis as she will have to adjust time or go to different center as she is COVID positive.   She tells me she is not interested in monoclonal antibody therapy at this time. I did offer her an appointment tomorrow (10/21/20) afternoon and discussed that we could try to arrange transportation. She again politely declined. Discussed possible utilization of Molnupiravir (oral antiviral) which could be sent to [facility] outpatient pharmacy. She politely declined and tells me she is discussing with her primary care provider. Unclear whether her primary care provider has already sent in medications or if she is waiting for a call her back from his office. Encouraged her to call us back [phone]) if she is interested in monoclonal antibody or oral antiviral. Will route this note to her primary care provider so he is aware.   Loel Dubonnet

## 2020-10-20 NOTE — Telephone Encounter (Signed)
Called to discuss with patient about COVID-19 symptoms and the use of one of the available treatments for those with mild to moderate Covid symptoms and at a high risk of hospitalization.  Pt appears to qualify for outpatient treatment due to co-morbid conditions and/or a member of an at-risk group in accordance with the FDA Emergency Use Authorization.    Symptom onset: 10/18/2020 Vaccinated: yes Booster? no Immunocompromised? no Qualifiers:    She would like to have one of [place] call back in about 30 minutes to see if we can discuss with her husband whom is out at the moment.   She is not able to come to [facility] for monoclonal Ab treatment due to transportation. We may be able to arrange for Thursday afternoon however.  Alternative would be antiviral with Molnupiravir.   Janene Madeira, NP

## 2020-10-26 ENCOUNTER — Telehealth (INDEPENDENT_AMBULATORY_CARE_PROVIDER_SITE_OTHER): Payer: Self-pay

## 2020-10-26 ENCOUNTER — Ambulatory Visit: Payer: Medicare Other | Admitting: Physician Assistant

## 2020-10-26 NOTE — Telephone Encounter (Signed)
Patient called wanting to know when will she be rescheduled for a covid test before her surgery. Patient was positive on 10/19/20 and her surgery is on 11/04/20. I explained that she will not need another covid test before her surgery. I did ask Caryl Pina at pre-admit and she stated she did not unless she was negative in between that time. The patient did not tell me she had another test.

## 2020-11-02 ENCOUNTER — Other Ambulatory Visit (INDEPENDENT_AMBULATORY_CARE_PROVIDER_SITE_OTHER): Payer: Self-pay | Admitting: Nurse Practitioner

## 2020-11-03 MED ORDER — SODIUM CHLORIDE 0.9 % IV SOLN: INTRAVENOUS | Status: DC

## 2020-11-03 MED ORDER — HYDROMORPHONE HCL 1 MG/ML IJ SOLN: 1.0000 mg | Freq: Once | INTRAMUSCULAR | Status: DC | PRN

## 2020-11-03 MED ORDER — FAMOTIDINE 20 MG PO TABS: 20.0000 mg | ORAL_TABLET | Freq: Once | ORAL | Status: AC

## 2020-11-03 MED ORDER — ONDANSETRON HCL 4 MG/2ML IJ SOLN: 4.0000 mg | Freq: Four times a day (QID) | INTRAMUSCULAR | Status: DC | PRN

## 2020-11-03 MED ORDER — ORAL CARE MOUTH RINSE: 15.0000 mL | Freq: Once | OROMUCOSAL | Status: AC

## 2020-11-03 MED ORDER — CHLORHEXIDINE GLUCONATE 0.12 % MT SOLN: 15.0000 mL | Freq: Once | OROMUCOSAL | Status: AC

## 2020-11-03 MED ORDER — CEFAZOLIN SODIUM-DEXTROSE 1-4 GM/50ML-% IV SOLN
1.0000 g | INTRAVENOUS | Status: AC
  Administered 2020-11-04: 2 g via INTRAVENOUS

## 2020-11-04 ENCOUNTER — Ambulatory Visit
Admission: RE | Admit: 2020-11-04 | Discharge: 2020-11-04 | Disposition: A | Discharge status: Home / Self Care | Payer: Medicare Other | Attending: Vascular Surgery | Admitting: Vascular Surgery

## 2020-11-04 ENCOUNTER — Ambulatory Visit: Payer: Medicare Other | Admitting: Urgent Care

## 2020-11-04 ENCOUNTER — Other Ambulatory Visit: Payer: Self-pay

## 2020-11-04 ENCOUNTER — Encounter
Admission: RE | Disposition: A | Discharge status: Home / Self Care | Payer: Self-pay | Source: Home / Self Care | Attending: Vascular Surgery

## 2020-11-04 ENCOUNTER — Encounter: Payer: Self-pay | Admitting: Vascular Surgery

## 2020-11-04 DIAGNOSIS — N186 End stage renal disease: Secondary | ICD-10-CM | POA: Diagnosis present

## 2020-11-04 DIAGNOSIS — Z90710 Acquired absence of both cervix and uterus: Secondary | ICD-10-CM | POA: Diagnosis not present

## 2020-11-04 DIAGNOSIS — E1122 Type 2 diabetes mellitus with diabetic chronic kidney disease: Secondary | ICD-10-CM | POA: Insufficient documentation

## 2020-11-04 DIAGNOSIS — Z803 Family history of malignant neoplasm of breast: Secondary | ICD-10-CM | POA: Diagnosis not present

## 2020-11-04 DIAGNOSIS — Z885 Allergy status to narcotic agent status: Secondary | ICD-10-CM | POA: Diagnosis not present

## 2020-11-04 DIAGNOSIS — Z91041 Radiographic dye allergy status: Secondary | ICD-10-CM | POA: Insufficient documentation

## 2020-11-04 DIAGNOSIS — E785 Hyperlipidemia, unspecified: Secondary | ICD-10-CM | POA: Diagnosis not present

## 2020-11-04 DIAGNOSIS — Z888 Allergy status to other drugs, medicaments and biological substances status: Secondary | ICD-10-CM | POA: Insufficient documentation

## 2020-11-04 DIAGNOSIS — Z881 Allergy status to other antibiotic agents status: Secondary | ICD-10-CM | POA: Diagnosis not present

## 2020-11-04 DIAGNOSIS — Z8673 Personal history of transient ischemic attack (TIA), and cerebral infarction without residual deficits: Secondary | ICD-10-CM | POA: Insufficient documentation

## 2020-11-04 DIAGNOSIS — Z87891 Personal history of nicotine dependence: Secondary | ICD-10-CM | POA: Diagnosis not present

## 2020-11-04 DIAGNOSIS — E1140 Type 2 diabetes mellitus with diabetic neuropathy, unspecified: Secondary | ICD-10-CM | POA: Diagnosis not present

## 2020-11-04 DIAGNOSIS — I6529 Occlusion and stenosis of unspecified carotid artery: Secondary | ICD-10-CM | POA: Insufficient documentation

## 2020-11-04 DIAGNOSIS — Z8616 Personal history of COVID-19: Secondary | ICD-10-CM | POA: Diagnosis not present

## 2020-11-04 DIAGNOSIS — I2510 Atherosclerotic heart disease of native coronary artery without angina pectoris: Secondary | ICD-10-CM | POA: Diagnosis not present

## 2020-11-04 DIAGNOSIS — Z8249 Family history of ischemic heart disease and other diseases of the circulatory system: Secondary | ICD-10-CM | POA: Insufficient documentation

## 2020-11-04 DIAGNOSIS — Z951 Presence of aortocoronary bypass graft: Secondary | ICD-10-CM | POA: Insufficient documentation

## 2020-11-04 DIAGNOSIS — Z992 Dependence on renal dialysis: Secondary | ICD-10-CM | POA: Insufficient documentation

## 2020-11-04 DIAGNOSIS — I120 Hypertensive chronic kidney disease with stage 5 chronic kidney disease or end stage renal disease: Secondary | ICD-10-CM | POA: Diagnosis not present

## 2020-11-04 DIAGNOSIS — N185 Chronic kidney disease, stage 5: Secondary | ICD-10-CM

## 2020-11-04 DIAGNOSIS — Z853 Personal history of malignant neoplasm of breast: Secondary | ICD-10-CM | POA: Diagnosis not present

## 2020-11-04 HISTORY — PX: AV FISTULA PLACEMENT: SHX1204

## 2020-11-04 LAB — POCT I-STAT, CHEM 8
BUN: 36 mg/dL — ABNORMAL HIGH (ref 8–23)
Calcium, Ion: 1.11 mmol/L — ABNORMAL LOW (ref 1.15–1.40)
Chloride: 103 mmol/L (ref 98–111)
Creatinine, Ser: 4.9 mg/dL — ABNORMAL HIGH (ref 0.44–1.00)
Glucose, Bld: 68 mg/dL — ABNORMAL LOW (ref 70–99)
HCT: 41 % (ref 36.0–46.0)
Hemoglobin: 13.9 g/dL (ref 12.0–15.0)
Potassium: 5.5 mmol/L — ABNORMAL HIGH (ref 3.5–5.1)
Sodium: 137 mmol/L (ref 135–145)
TCO2: 24 mmol/L (ref 22–32)

## 2020-11-04 LAB — GLUCOSE, CAPILLARY
Glucose-Capillary: 72 mg/dL (ref 70–99)
Glucose-Capillary: 78 mg/dL (ref 70–99)
Glucose-Capillary: 80 mg/dL (ref 70–99)

## 2020-11-04 SURGERY — ARTERIOVENOUS (AV) FISTULA CREATION
Anesthesia: General | Site: Arm Upper | Laterality: Left

## 2020-11-04 MED ORDER — KETAMINE HCL 50 MG/5ML IJ SOSY
PREFILLED_SYRINGE | INTRAMUSCULAR | Status: AC
  Filled 2020-11-04: qty 5

## 2020-11-04 MED ORDER — EPINEPHRINE PF 1 MG/ML IJ SOLN
INTRAMUSCULAR | Status: AC
  Filled 2020-11-04: qty 1

## 2020-11-04 MED ORDER — SODIUM CHLORIDE 0.9 % IV SOLN
INTRAVENOUS | Status: DC | PRN
  Administered 2020-11-04: 10 mL via INTRAMUSCULAR

## 2020-11-04 MED ORDER — CEFAZOLIN SODIUM-DEXTROSE 2-4 GM/100ML-% IV SOLN
INTRAVENOUS | Status: AC
  Filled 2020-11-04: qty 100

## 2020-11-04 MED ORDER — CHLORHEXIDINE GLUCONATE 0.12 % MT SOLN
OROMUCOSAL | Status: AC
  Administered 2020-11-04: 15 mL via OROMUCOSAL
  Filled 2020-11-04: qty 15

## 2020-11-04 MED ORDER — KETAMINE HCL 10 MG/ML IJ SOLN
INTRAMUSCULAR | Status: DC | PRN
  Administered 2020-11-04 (×2): 10 mg via INTRAVENOUS
  Administered 2020-11-04: 20 mg via INTRAVENOUS

## 2020-11-04 MED ORDER — HEPARIN SODIUM (PORCINE) 5000 UNIT/ML IJ SOLN
INTRAMUSCULAR | Status: AC
  Filled 2020-11-04: qty 1

## 2020-11-04 MED ORDER — HYDROCODONE-ACETAMINOPHEN 5-325 MG PO TABS: 1.0000 | ORAL_TABLET | Freq: Four times a day (QID) | ORAL | 0 refills | Status: DC | PRN

## 2020-11-04 MED ORDER — FENTANYL CITRATE (PF) 100 MCG/2ML IJ SOLN
INTRAMUSCULAR | Status: AC
  Filled 2020-11-04: qty 2

## 2020-11-04 MED ORDER — LIDOCAINE HCL (CARDIAC) PF 100 MG/5ML IV SOSY
PREFILLED_SYRINGE | INTRAVENOUS | Status: DC | PRN
  Administered 2020-11-04: 80 mg via INTRAVENOUS

## 2020-11-04 MED ORDER — PROPOFOL 10 MG/ML IV BOLUS
INTRAVENOUS | Status: DC | PRN
  Administered 2020-11-04: 10 mg via INTRAVENOUS
  Administered 2020-11-04: 20 mg via INTRAVENOUS

## 2020-11-04 MED ORDER — BUPIVACAINE HCL (PF) 0.5 % IJ SOLN
INTRAMUSCULAR | Status: AC
  Filled 2020-11-04: qty 30

## 2020-11-04 MED ORDER — HEPARIN SODIUM (PORCINE) 1000 UNIT/ML IJ SOLN
INTRAMUSCULAR | Status: DC | PRN
  Administered 2020-11-04: 3000 [IU] via INTRAVENOUS

## 2020-11-04 MED ORDER — PROPOFOL 10 MG/ML IV BOLUS
INTRAVENOUS | Status: AC
  Filled 2020-11-04: qty 20

## 2020-11-04 MED ORDER — PHENYLEPHRINE HCL (PRESSORS) 10 MG/ML IV SOLN
INTRAVENOUS | Status: DC | PRN
  Administered 2020-11-04: 200 ug via INTRAVENOUS
  Administered 2020-11-04 (×4): 100 ug via INTRAVENOUS

## 2020-11-04 MED ORDER — FAMOTIDINE 20 MG PO TABS
ORAL_TABLET | ORAL | Status: AC
  Administered 2020-11-04: 20 mg via ORAL
  Filled 2020-11-04: qty 1

## 2020-11-04 MED ORDER — DEXMEDETOMIDINE (PRECEDEX) IN NS 20 MCG/5ML (4 MCG/ML) IV SYRINGE
PREFILLED_SYRINGE | INTRAVENOUS | Status: DC | PRN
  Administered 2020-11-04 (×3): 4 ug via INTRAVENOUS

## 2020-11-04 MED ORDER — PAPAVERINE HCL 30 MG/ML IJ SOLN
INTRAMUSCULAR | Status: AC
  Filled 2020-11-04: qty 2

## 2020-11-04 MED ORDER — CHLORHEXIDINE GLUCONATE CLOTH 2 % EX PADS
6.0000 | MEDICATED_PAD | Freq: Once | CUTANEOUS | Status: AC
  Administered 2020-11-04: 6 via TOPICAL

## 2020-11-04 MED ORDER — BUPIVACAINE-EPINEPHRINE (PF) 0.5% -1:200000 IJ SOLN
INTRAMUSCULAR | Status: DC | PRN
  Administered 2020-11-04: 17 mL via PERINEURAL

## 2020-11-04 MED ORDER — EVICEL 2 ML EX KIT
PACK | CUTANEOUS | Status: DC | PRN
  Administered 2020-11-04: 1

## 2020-11-04 MED ORDER — FENTANYL CITRATE (PF) 100 MCG/2ML IJ SOLN
INTRAMUSCULAR | Status: DC | PRN
  Administered 2020-11-04 (×2): 25 ug via INTRAVENOUS

## 2020-11-04 MED ORDER — PROPOFOL 500 MG/50ML IV EMUL
INTRAVENOUS | Status: DC | PRN
  Administered 2020-11-04: 50 ug/kg/min via INTRAVENOUS

## 2020-11-04 MED ORDER — GLYCOPYRROLATE 0.2 MG/ML IJ SOLN
INTRAMUSCULAR | Status: DC | PRN
  Administered 2020-11-04: .1 mg via INTRAVENOUS

## 2020-11-04 MED ORDER — DEXTROSE-NACL 5-0.45 % IV SOLN: INTRAVENOUS | Status: DC

## 2020-11-04 SURGICAL SUPPLY — 53 items
ADH SKN CLS APL DERMABOND .7 (GAUZE/BANDAGES/DRESSINGS) ×1
APL PRP STRL LF DISP 70% ISPRP (MISCELLANEOUS) ×1
BAG DECANTER FOR FLEXI CONT (MISCELLANEOUS) ×2 IMPLANT
BLADE SURG SZ11 CARB STEEL (BLADE) ×2 IMPLANT
BOOT SUTURE AID YELLOW STND (SUTURE) ×2 IMPLANT
BRUSH SCRUB EZ  4% CHG (MISCELLANEOUS) ×1
BRUSH SCRUB EZ 4% CHG (MISCELLANEOUS) ×1 IMPLANT
CANISTER SUCT 1200ML W/VALVE (MISCELLANEOUS) ×2 IMPLANT
CHLORAPREP W/TINT 26 (MISCELLANEOUS) ×2 IMPLANT
CLIP SPRNG 6MM S-JAW DBL (CLIP) ×2
COVER WAND RF STERILE (DRAPES) ×2 IMPLANT
DERMABOND ADVANCED (GAUZE/BANDAGES/DRESSINGS) ×1
DERMABOND ADVANCED .7 DNX12 (GAUZE/BANDAGES/DRESSINGS) ×1 IMPLANT
ELECT CAUTERY BLADE 6.4 (BLADE) ×2 IMPLANT
ELECT REM PT RETURN 9FT ADLT (ELECTROSURGICAL) ×2
ELECTRODE REM PT RTRN 9FT ADLT (ELECTROSURGICAL) ×1 IMPLANT
GEL ULTRASOUND 20GR AQUASONIC (MISCELLANEOUS) IMPLANT
GLOVE INDICATOR 7.5 STRL GRN (GLOVE) ×2 IMPLANT
GLOVE SURG ENC MOIS LTX SZ7 (GLOVE) ×4 IMPLANT
GOWN STRL REUS W/ TWL LRG LVL3 (GOWN DISPOSABLE) ×2 IMPLANT
GOWN STRL REUS W/ TWL XL LVL3 (GOWN DISPOSABLE) ×1 IMPLANT
GOWN STRL REUS W/TWL LRG LVL3 (GOWN DISPOSABLE) ×4
GOWN STRL REUS W/TWL XL LVL3 (GOWN DISPOSABLE) ×2
HEMOSTAT SURGICEL 2X3 (HEMOSTASIS) IMPLANT
IV NS 500ML (IV SOLUTION) ×2
IV NS 500ML BAXH (IV SOLUTION) ×1 IMPLANT
KIT TURNOVER KIT A (KITS) ×2 IMPLANT
LABEL OR SOLS (LABEL) ×2 IMPLANT
LOOP RED MAXI  1X406MM (MISCELLANEOUS) ×1
LOOP VESSEL MAXI 1X406 RED (MISCELLANEOUS) ×1 IMPLANT
LOOP VESSEL MINI 0.8X406 BLUE (MISCELLANEOUS) ×1 IMPLANT
LOOPS BLUE MINI 0.8X406MM (MISCELLANEOUS) ×1
MANIFOLD NEPTUNE II (INSTRUMENTS) ×2 IMPLANT
NEEDLE FILTER BLUNT 18X 1/2SAF (NEEDLE) ×1
NEEDLE FILTER BLUNT 18X1 1/2 (NEEDLE) ×1 IMPLANT
NS IRRIG 500ML POUR BTL (IV SOLUTION) ×2 IMPLANT
PACK EXTREMITY ARMC (MISCELLANEOUS) ×2 IMPLANT
PAD PREP 24X41 OB/GYN DISP (PERSONAL CARE ITEMS) ×2 IMPLANT
SOLUTION CELL SAVER (CLIP) ×1 IMPLANT
STOCKINETTE STRL 4IN 9604848 (GAUZE/BANDAGES/DRESSINGS) ×2 IMPLANT
SUT MNCRL AB 4-0 PS2 18 (SUTURE) ×2 IMPLANT
SUT PROLENE 6 0 BV (SUTURE) ×8 IMPLANT
SUT SILK 2 0 (SUTURE) ×2
SUT SILK 2-0 18XBRD TIE 12 (SUTURE) ×1 IMPLANT
SUT SILK 3 0 (SUTURE) ×2
SUT SILK 3-0 18XBRD TIE 12 (SUTURE) ×1 IMPLANT
SUT SILK 4 0 (SUTURE) ×2
SUT SILK 4-0 18XBRD TIE 12 (SUTURE) ×1 IMPLANT
SUT VIC AB 3-0 SH 27 (SUTURE) ×2
SUT VIC AB 3-0 SH 27X BRD (SUTURE) ×1 IMPLANT
SYR 20ML LL LF (SYRINGE) ×2 IMPLANT
SYR 3ML LL SCALE MARK (SYRINGE) ×2 IMPLANT
SYR TB 1ML 27GX1/2 LL (SYRINGE) IMPLANT

## 2020-11-04 NOTE — H&P (Signed)
[facility] Admission History & Physical  MRN : [id number]  [name] is a 87 y.o. ([birth year]) female who presents with chief complaint of No chief complaint on file. Marland Kitchen  History of Present Illness: patient presents today for AVF creation. Had covid a few weeks ago and surgery was delayed. No new complaints today. Still using her Permcath.  Current Facility-Administered Medications  Medication Dose Route Frequency Provider Last Rate Last Admin  . 0.9 %  sodium chloride infusion   Intravenous Continuous Arita Miss, MD 10 mL/hr at 11/04/20 1214 New Bag at 11/04/20 1214  . ceFAZolin (ANCEF) 2-4 GM/100ML-% IVPB           . ceFAZolin (ANCEF) IVPB 1 g/50 mL premix  1 g Intravenous On Call to OR Kris Hartmann, NP      . HYDROmorphone (DILAUDID) injection 1 mg  1 mg Intravenous Once PRN Kris Hartmann, NP      . ondansetron [facility]) injection 4 mg  4 mg Intravenous Q6H PRN Kris Hartmann, NP        Past Medical History:  Diagnosis Date  . Arthritis   . Bilateral Macular degeneration   . Breast cancer ([place]) 2012   right breast  . Breast mass, right   . CAD S/P CABG x 4    a. 09/2012 CABG x 4: LIMA to LAD, SVG to Diag, SVG to OM1, SVG to PDA, EVH from bilateral thighs  . Cancer [facility])    breast cancer, right side 2012  . Complication of anesthesia   . Coronary artery disease 08/22/2012   sees Dr Rockey Situ  . Diabetes ([place])   . Diastolic dysfunction    a. 06/2020 Echo: EF 60-65%; b. 08/2020 Echo: EF 60-65%, no rwma, Gr1 DD. Nl RV size/fxn.  . DOE (dyspnea on exertion)   . ESRD (end stage renal disease) ([place])   . Gastritis    hx of  . GERD (gastroesophageal reflux disease)   . Headache(784.0)    migraines  . History of breast cancer    39 treatments of radiation. Negative chemo.  Marland Kitchen History of seasonal allergies   . Hyperlipemia   . Hypertension    sees Dr. Fulton Reek  . Hypothyroidism   . Neuropathy   . Personal history of radiation therapy    . Pneumonia    hx of  . PONV (postoperative nausea and vomiting)   . Stroke ([place])    2012  . Vertigo     Past Surgical History:  Procedure Laterality Date  . ABDOMINAL HYSTERECTOMY    . APPENDECTOMY    . BACK SURGERY    . BREAST BIOPSY Right    2012 positive- [facility]  . BREAST LUMPECTOMY Right 2012   F/U radiation   . BREAST SURGERY    . CARDIAC CATHETERIZATION  2014  . CAROTID PTA/STENT INTERVENTION Left 06/14/2020   Procedure: CAROTID PTA/STENT INTERVENTION;  Surgeon: Algernon Huxley, MD;  Location: [place] CV LAB;  Service: Cardiovascular;  Laterality: Left;  . CATARACT EXTRACTION W/PHACO Right 08/14/2017   Procedure: CATARACT EXTRACTION PHACO AND INTRAOCULAR LENS PLACEMENT (I[facility]);  Surgeon: Birder Robson, MD;  Location: [facility] ORS;  Service: Ophthalmology;  Laterality: Right;  [place] 00:43.0 AP% 17.1 CDE 7.37 Fluid Pack lot # [id number]  . CATARACT EXTRACTION W/PHACO Left 10/09/2017   Procedure: CATARACT EXTRACTION PHACO AND INTRAOCULAR LENS PLACEMENT ([place]);  Surgeon: Birder Robson, MD;  Location: [facility] ORS;  Service: Ophthalmology;  Laterality: Left;  [place] 00:30.3 AP% 11.0 CDE 3.33 Fluid Pack Lot # [id number]  . CORONARY ARTERY BYPASS GRAFT  09/05/2012   Procedure: CORONARY ARTERY BYPASS GRAFTING (CABG);  Surgeon: Rexene Alberts, MD;  Location: [place];  Service: Open Heart Surgery;  Laterality: N/A;  CABG x four, using left internal mammary artery and bilateral greater saphenous vein harvested endoscopically  . DIALYSIS/PERMA CATHETER INSERTION Left 06/25/2020   Procedure: DIALYSIS/PERMA CATHETER INSERTION;  Surgeon: Algernon Huxley, MD;  Location: [place] CV LAB;  Service: Cardiovascular;  Laterality: Left;  . DIALYSIS/PERMA CATHETER INSERTION N/A 08/10/2020   Procedure: DIALYSIS/PERMA CATHETER INSERTION;  Surgeon: Katha Cabal, MD;  Location: [place] CV LAB;  Service: Cardiovascular;  Laterality: N/A;  . DIALYSIS/PERMA CATHETER REMOVAL N/A 08/05/2020   Procedure:  DIALYSIS/PERMA CATHETER REMOVAL;  Surgeon: Algernon Huxley, MD;  Location: [place] CV LAB;  Service: Cardiovascular;  Laterality: N/A;  . INTRAOPERATIVE TRANSESOPHAGEAL ECHOCARDIOGRAM  09/05/2012   Procedure: INTRAOPERATIVE TRANSESOPHAGEAL ECHOCARDIOGRAM;  Surgeon: Rexene Alberts, MD;  Location: [place];  Service: Open Heart Surgery;  Laterality: N/A;  . KNEE SURGERY     Bilateral nerve block  . LAPAROSCOPIC NISSEN FUNDOPLICATION    . OVARIAN CYST REMOVAL       Social History   Tobacco Use  . Smoking status: Former Smoker    Types: Cigarettes    Quit date: 11/13/1988    Years since quitting: 31.9  . Smokeless tobacco: Never Used  Vaping Use  . Vaping Use: Never used  Substance Use Topics  . Alcohol use: No  . Drug use: No     Family History  Problem Relation Age of Onset  . Heart disease Brother        CABG & stents  . Hyperlipidemia Brother   . Hypertension Brother   . Cancer Father   . Heart disease Mother   . Breast cancer Cousin   . Kidney disease Neg Hx     Allergies  Allergen Reactions  . Clonidine Other (See Comments) and Shortness Of Breath    Other reaction(s): Other (see comments) Other reaction(s): Other (See Comments)  . Darvocet [Propoxyphene N-Acetaminophen] Anaphylaxis  . Hydralazine     Other reaction(s): Other (See Comments) glomerulonephritis  . Hydralazine Hcl Other (See Comments)    glomerulonephritis  . Esomeprazole     Other reaction(s): Unknown  . Guaifenesin     Other reaction(s): Unknown Other reaction(s): Unknown Other reaction(s): Unknown Other reaction(s): Unknown  . Phenylephrine-Guaifenesin     Other reaction(s): Unknown  . Rabeprazole     Other reaction(s): Unknown  . Telithromycin     Other reaction(s): Unknown Other reaction(s): Unknown Other reaction(s): Unknown  . Fluocinolone Other (See Comments)    Feels crazy Other reaction(s): Other (see comments) Other reaction(s): Other (See Comments) Feels crazy Feels crazy   . Iodinated Diagnostic Agents Nausea And Vomiting and Other (See Comments)    Burning Other reaction(s): Other (see comments) Other reaction(s): Other (See Comments) Burning Nausea & vomiting  . Levaquin [Levofloxacin] Nausea Only  . Statins Other (See Comments)    Myalgia Other reaction(s): Other (See Comments), Unknown, Unknown Myalgia    REVIEW OF SYSTEMS(Negative unless checked)  Constitutional: [] ???Weight loss [] ???Fever [] ???Chills Cardiac: [] ???Chest pain [] ???Chest pressure [] ???Palpitations [] ???Shortness of breath when laying flat [] ???Shortness of breath at rest [x] ???Shortness of breath with exertion. Vascular: [] ???Pain in legs with walking [] ???Pain in legs at rest [] ???Pain in legs when laying flat [] ???Claudication [] ???Pain in feet when walking [] ???Pain  in feet at rest [] ???Pain in feet when laying flat [] ???History of DVT [] ???Phlebitis [] ???Swelling in legs [] ???Varicose veins [] ???Non-healing ulcers Pulmonary: [] ???Uses home oxygen [] ???Productive cough [] ???Hemoptysis [] ???Wheeze [] ???COPD [] ???Asthma Neurologic: [] ???Dizziness [] ???Blackouts [] ???Seizures [x] ???History of stroke [] ???History of TIA [] ???Aphasia [] ???Temporary blindness [] ???Dysphagia [] ???Weakness or numbness in arms [] ???Weakness or numbness in legs Musculoskeletal: [x] ???Arthritis [] ???Joint swelling [x] ???Joint pain [] ???Low back pain Hematologic: [x] ???Easy bruising [] ???Easy bleeding [] ???Hypercoagulable state [] ???Anemic [] ???Hepatitis Gastrointestinal: [] ???Blood in stool [] ???Vomiting blood [x] ???Gastroesophageal reflux/heartburn [] ???Abdominal pain Genitourinary: [x] ???Chronic kidney disease [] ???Difficult urination [] ???Frequent urination [] ???Burning with urination [] ???Hematuria Skin: [] ???Rashes [] ???Ulcers [] ???Wounds Psychological: [] ???History of anxiety [] ???History of major  depression.  Physical Examination  Vitals:   11/04/20 1129  BP: (!) 132/93  Pulse: 90  Resp: 16  Temp: (!) 97.4 F (36.3 C)  TempSrc: Oral  SpO2: 95%  Weight: 84.4 kg   Body mass index is 31.93 kg/m. Gen: WD/WN, NAD Head: [place]/AT, No temporalis wasting.  Ear/Nose/Throat: Hearing grossly intact, nares w/o erythema or drainage, oropharynx w/o Erythema/Exudate,  Eyes: Conjunctiva clear, sclera non-icteric Neck: Trachea midline.  No JVD.  Pulmonary:  Good air movement, respirations not labored, no use of accessory muscles.  Cardiac: irregular Vascular:  Vessel Right Left  Radial Palpable Palpable           Musculoskeletal: M/S 5/5 throughout.  Extremities without ischemic changes.  No deformity or atrophy. Moderate swelling bilaterally Neurologic: Sensation grossly intact in extremities.  Symmetrical.  Speech is fluent. Motor exam as listed above. Psychiatric: Judgment intact, Mood & affect appropriate for pt's clinical situation. Dermatologic: No rashes or ulcers noted.  No cellulitis or open wounds.      CBC Lab Results  Component Value Date   WBC 4.7 08/07/2020   HGB 13.9 11/04/2020   HCT 41.0 11/04/2020   MCV 96.4 08/07/2020   PLT 92 (L) 08/07/2020    BMET    Component Value Date/Time   NA 137 11/04/2020 1208   NA 140 11/22/2015 0830   NA 142 05/07/2014 0516   K 5.5 (H) 11/04/2020 1208   K 3.2 (L) 05/07/2014 0516   CL 103 11/04/2020 1208   CL 104 05/07/2014 0516   CO2 27 08/10/2020 0418   CO2 29 05/07/2014 0516   GLUCOSE 68 (L) 11/04/2020 1208   GLUCOSE 64 (L) 05/07/2014 0516   BUN 36 (H) 11/04/2020 1208   BUN 35 (H) 11/22/2015 0830   BUN 26 (H) 05/07/2014 0516   CREATININE 4.90 (H) 11/04/2020 1208   CREATININE 1.13 05/07/2014 0516   CALCIUM 6.9 (L) 08/10/2020 0418   CALCIUM 7.9 (L) 05/07/2014 0516   GFRNONAA 6 (L) 08/10/2020 0418   GFRNONAA 47 (L) 05/07/2014 0516   GFRAA 6 (L) 06/30/2020 0611   GFRAA 54 (L) 05/07/2014 0516   Estimated  Creatinine Clearance: 9 mL/min (A) (by C-G formula based on SCr of 4.9 mg/dL (H)).  COAG Lab Results  Component Value Date   INR 1.0 06/27/2020   INR 1.0 06/23/2020   INR 0.9 04/23/2020    Radiology No results found.   Assessment/Plan Diabetes mellitus ([place]) blood glucose control important in reducing the progression of atherosclerotic disease. Also, involved in wound healing. On appropriate medications.   Hyperlipidemia lipid control important in reducing the progression of atherosclerotic disease. Continue statin therapy  Carotid stenosis We performed a carotid duplex recently which correlates much more with her catheter-based angiogram findings and would be in the 40 to 59% range bilaterally.Particularly given her extensive medical comorbidities, no intervention or surgery would  be recommended at this time. Continue current medical regimen. Recheck in 6 months.  HTN, goal below 140/80 blood pressure control important in reducing the progression of atherosclerotic disease. On appropriate oral medications.  ESRD on hemodialysis ([place]) Vein mapping shows adequate cephalic vein at the antecubital fossa bilaterally.  It is a little larger on the right than the left, but she is right-hand dominant.  The basilic veins were also adequate bilaterally.  Triphasic arterial flow was seen in both upper extremities. I had a discussion with she and her husband.  We could perform brachiocephalic AV fistulas in either upper extremity I believe.  The vein is bigger on the right, but she does not want it on the right as she has had had breast cancer surgery on the right side and she is right-hand dominant.  That is certainly reasonable.  We discussed that if the left arm fistula fails to mature her or eventually fails, we may need to the right arm.  She is okay with this.    [name], MD  11/04/2020 12:41 PM

## 2020-11-04 NOTE — Progress Notes (Signed)
Pt has RT arm restriction due to lymph node removal, per Dr. Lucky Cowboy attempt at foot. Pt has noted bilateral edema and unsuccessful in foot attempt.   Dr. Lavone Neri notified and ordered for blood work and IV to be placed in RT arm (non op side)

## 2020-11-04 NOTE — Progress Notes (Signed)
PT has poor historian with med list. PT states she took plavix yesterday. Pt spouse contacted in waiting room and unable to confirm if this is accurate. Dr. Lavone Neri made awre

## 2020-11-04 NOTE — Anesthesia Preprocedure Evaluation (Addendum)
Anesthesia Evaluation  Patient identified by MRN, date of birth, ID band Patient awake  General Assessment Comment:K 5.5, prob hemolyzed sample drawn from 24g PIV  Reviewed: Allergy & Precautions, H&P , NPO status , reviewed documented beta blocker date and time   History of Anesthesia Complications (+) PONV and history of anesthetic complications  Airway Mallampati: II  TM Distance: >3 FB Neck ROM: limited    Dental  (+) Upper Dentures, Partial Lower, Chipped, Missing   Pulmonary pneumonia, resolved, former smoker,    Pulmonary exam normal        Cardiovascular hypertension, + CAD and + DOE   Rhythm:regular  Cardiology clearance on 10/08/20   Neuro/Psych  Headaches, R sided weakness CVA, Residual Symptoms    GI/Hepatic GERD  Medicated and Controlled,  Endo/Other  diabetesHypothyroidism   Renal/GU ESRF and DialysisRenal diseaseDialysis yesterday     Musculoskeletal  (+) Arthritis ,   Abdominal   Peds  Hematology  (+) Blood dyscrasia, anemia ,   Anesthesia Other Findings Past Medical History: No date: Arthritis No date: Bilateral Macular degeneration 2012: Breast cancer ([facility])     Comment:  right breast No date: Breast mass, right No date: CAD S/P CABG x 4     Comment:  a. 09/2012 CABG x 4: LIMA to LAD, SVG to Diag, SVG to               OM1, SVG to PDA, EVH from bilateral thighs No date: Cancer ([place])     Comment:  breast cancer, right side [id number] No date: Complication of anesthesia 08/22/2012: Coronary artery disease     Comment:  sees Dr Rockey Situ No date: Diabetes ([place]) No date: Diastolic dysfunction     Comment:  a. 06/2020 Echo: EF 60-65%; b. 08/2020 Echo: EF 60-65%,               no rwma, Gr1 DD. Nl RV size/fxn. No date: DOE (dyspnea on exertion) No date: ESRD (end stage renal disease) ([place]) No date: Gastritis     Comment:  hx of No date: GERD (gastroesophageal reflux disease) No date: Headache(784.0)      Comment:  migraines No date: History of breast cancer     Comment:  39 treatments of radiation. Negative chemo. No date: History of seasonal allergies No date: Hyperlipemia No date: Hypertension     Comment:  sees Dr. Fulton Reek No date: Hypothyroidism No date: Neuropathy No date: Personal history of radiation therapy No date: Pneumonia     Comment:  hx of No date: PONV (postoperative nausea and vomiting) No date: Stroke [facility])     Comment:  2012 No date: Vertigo  Past Surgical History: No date: ABDOMINAL HYSTERECTOMY No date: APPENDECTOMY No date: BACK SURGERY No date: BREAST BIOPSY; Right     Comment:  2012 positive- [facility] 2012: BREAST LUMPECTOMY; Right     Comment:  F/U radiation  No date: BREAST SURGERY 2014: CARDIAC CATHETERIZATION 06/14/2020: CAROTID PTA/STENT INTERVENTION; Left     Comment:  Procedure: CAROTID PTA/STENT INTERVENTION;  Surgeon:               Algernon Huxley, MD;  Location: [place] [facility];                Service: Cardiovascular;  Laterality: Left; 08/14/2017: CATARACT EXTRACTION W/PHACO; Right     Comment:  Procedure: CATARACT EXTRACTION PHACO AND INTRAOCULAR               LENS PLACEMENT (IOC);  Surgeon: Birder Robson, MD;                Location: [facility];  Service: Ophthalmology;  Laterality:              Right;  [place] 00:43.0 AP% 17.1 CDE 7.37 Fluid Pack lot #               [id number] 10/09/2017: CATARACT EXTRACTION W/PHACO; Left     Comment:  Procedure: CATARACT EXTRACTION PHACO AND INTRAOCULAR               LENS PLACEMENT (IOC);  Surgeon: Birder Robson, MD;                Location: [facility];  Service: Ophthalmology;  Laterality:              Left;  [place] 00:30.3 AP% 11.0 CDE 3.33 Fluid Pack Lot #               [id number] 09/05/2012: CORONARY ARTERY BYPASS GRAFT     Comment:  Procedure: CORONARY ARTERY BYPASS GRAFTING (CABG);                Surgeon: Rexene Alberts, MD;  Location: [place];  Service:              Open Heart Surgery;   Laterality: N/A;  CABG x four, using              left internal mammary artery and bilateral greater               saphenous vein harvested endoscopically 06/25/2020: DIALYSIS/PERMA CATHETER INSERTION; Left     Comment:  Procedure: DIALYSIS/PERMA CATHETER INSERTION;  Surgeon:               Algernon Huxley, MD;  Location: [place] [facility];                Service: Cardiovascular;  Laterality: Left; 08/10/2020: DIALYSIS/PERMA CATHETER INSERTION; N/A     Comment:  Procedure: DIALYSIS/PERMA CATHETER INSERTION;  Surgeon:               Katha Cabal, MD;  Location: [place] [facility];               Service: Cardiovascular;  Laterality: N/A; 08/05/2020: DIALYSIS/PERMA CATHETER REMOVAL; N/A     Comment:  Procedure: DIALYSIS/PERMA CATHETER REMOVAL;  Surgeon:               Algernon Huxley, MD;  Location: [place] [facility];                Service: Cardiovascular;  Laterality: N/A; 09/05/2012: INTRAOPERATIVE TRANSESOPHAGEAL ECHOCARDIOGRAM     Comment:  Procedure: INTRAOPERATIVE TRANSESOPHAGEAL               ECHOCARDIOGRAM;  Surgeon: Rexene Alberts, MD;  Location:              [place];  Service: Open Heart Surgery;  Laterality: N/A; No date: KNEE SURGERY     Comment:  Bilateral nerve block No date: LAPAROSCOPIC NISSEN FUNDOPLICATION No date: OVARIAN CYST REMOVAL  BMI    Body Mass Index: 31.93 kg/m      Reproductive/Obstetrics                            Anesthesia Physical Anesthesia Plan  ASA: IV  Anesthesia Plan: General   Post-op Pain Management:  Induction: Intravenous  PONV Risk Score and Plan: Treatment may vary due to age or medical condition and TIVA  Airway Management Planned: Nasal Cannula and Natural Airway  Additional Equipment:   Intra-op Plan:   Post-operative Plan:   Informed Consent: I have reviewed the patients History and Physical, chart, labs and discussed the procedure including the risks, benefits and alternatives for the proposed  anesthesia with the patient or authorized representative who has indicated his/her understanding and acceptance.     Dental Advisory Given  Plan Discussed with: CRNA  Anesthesia Plan Comments: (Back up LMA if needed)       Anesthesia Quick Evaluation

## 2020-11-04 NOTE — Progress Notes (Signed)
PT very difficult stick with many restrictions, Dr. Lucky Cowboy called about all lab work and verbal order given to discontinue orders.   PT k 5.5 with questionable specimen. Dr. Lavone Neri made awrae. Pt was fully dialyzed yesterday

## 2020-11-04 NOTE — Discharge Instructions (Signed)

## 2020-11-04 NOTE — Op Note (Signed)
[facility]   OPERATIVE NOTE   PROCEDURE: Left brachiocephalic arteriovenous fistula placement  PRE-OPERATIVE DIAGNOSIS: 1.  ESRD        POST-OPERATIVE DIAGNOSIS: 1. ESRD       SURGEON: [name], MD  ASSISTANT(S): Hezzie Bump, PA-C  ANESTHESIA: general  ESTIMATED BLOOD LOSS: 10 cc  FINDING(S): Adequate cephalic vein for fistula creation  SPECIMEN(S):  none  INDICATIONS:   [name] is a 87 y.o. female who presents with renal failure in need of pemanent dialysis acces.  The patient is scheduled for left arm AVF placement.  The patient is aware the risks include but are not limited to: bleeding, infection, steal syndrome, nerve damage, ischemic monomelic neuropathy, failure to mature, and need for additional procedures.  The patient is aware of the risks of the procedure and elects to proceed forward. An assistant was present during the procedure to help facilitate the exposure and expedite the procedure.  DESCRIPTION: After full informed written consent was obtained from the patient, the patient was brought back to the operating room and placed supine upon the operating table.  Prior to induction, the patient received IV antibiotics.  The assistant provided retraction and mobilization to help facilitate exposure and expedite the procedure throughout the entire procedure.  This included following suture, using retractors, and optimizing lighting. After obtaining adequate anesthesia, the patient was then prepped and draped in the standard fashion for a left arm access procedure.  I made a curvilinear incision at the level of the antecubital fossa and dissected through the subcutaneous tissue and fascia to gain exposure of the brachial artery.  This was noted to be patent and adequate in size for fistula creation.  This was dissected out proximally and distally and prepared for control with vessel loops .  I then dissected out the cephalic vein.  This was  noted to be patent and adequate in size for fistula creation.  I then gave the patient 3000 units of intravenous heparin.  The vein was marked for orientation and the distal segment of the vein was ligated with a  2-0 silk, and the vein was transected.  I then instilled the heparinized saline into the vein and clamped it.  At this point, I reset my exposure of the brachial artery and pulled up control on the vessel loops.  I made an arteriotomy with a #11 blade, and then I extended the arteriotomy with a Potts scissor.  I injected heparinized saline proximal and distal to this arteriotomy.  The vein was then sewn to the artery in an end-to-side configuration with a running stitch of 6-0 Prolene.  Prior to completing this anastomosis, I allowed the vein and artery to backbleed.  There was no evidence of clot from any vessels.  I completed the anastomosis in the usual fashion and then released all vessel loops and clamps.  There was a palpable  thrill in the venous outflow, and there was a palpable pulse in the artery distal to the anastomosis.  At this point, I irrigated out the surgical wound.  Surgicel was placed. There was no further active bleeding.  The subcutaneous tissue was reapproximated with a running stitch of 3-0 Vicryl.  The skin was then closed with a 4-0 Monocryl suture.  The skin was then cleaned, dried, and reinforced with Dermabond.  The patient tolerated this procedure well and was taken to the recovery room in stable condition  COMPLICATIONS: None  CONDITION: Stable   [name]  11/04/2020, 2:38 PM  This note was created with Dragon Medical transcription system. Any errors in dictation are purely unintentional.

## 2020-11-04 NOTE — Transfer of Care (Signed)
Immediate Anesthesia Transfer of Care Note  Patient: [name]  Procedure(s) Performed: ARTERIOVENOUS (AV) FISTULA CREATION (BRACHIOCEPHALIC) (Left Arm Upper)  Patient Location: PACU  Anesthesia Type:General  Level of Consciousness: awake  Airway & Oxygen Therapy: Patient Spontanous Breathing and Patient connected to nasal cannula oxygen  Post-op Assessment: Report given to RN and Post -op Vital signs reviewed and stable  Post vital signs: Reviewed and stable  Last Vitals:  Vitals Value Taken Time  BP 128/66 11/04/20 1500  Temp    Pulse 85 11/04/20 1503  Resp 20 11/04/20 1503  SpO2 100 % 11/04/20 1503  Vitals shown include unvalidated device data.  Last Pain:  Vitals:   11/04/20 1129  TempSrc: Oral  PainSc: 0-No pain         Complications: No complications documented.

## 2020-11-05 ENCOUNTER — Encounter: Payer: Self-pay | Admitting: Vascular Surgery

## 2020-11-05 NOTE — Anesthesia Postprocedure Evaluation (Signed)
Anesthesia Post Note  Patient: [name]  Procedure(s) Performed: ARTERIOVENOUS (AV) FISTULA CREATION (BRACHIOCEPHALIC) (Left Arm Upper)  Patient location during evaluation: PACU Anesthesia Type: General Level of consciousness: awake and alert Pain management: pain level controlled Vital Signs Assessment: post-procedure vital signs reviewed and stable Respiratory status: spontaneous breathing, nonlabored ventilation, respiratory function stable and patient connected to nasal cannula oxygen Cardiovascular status: blood pressure returned to baseline and stable Postop Assessment: no apparent nausea or vomiting Anesthetic complications: no   No complications documented.   Last Vitals:  Vitals:   11/04/20 1530 11/04/20 1551  BP: (!) 151/75 (!) 143/55  Pulse: 87 84  Resp: 19 16  Temp:  (!) 36.2 C  SpO2: 98% 100%    Last Pain:  Vitals:   11/04/20 1551  TempSrc: Temporal  PainSc:                  Arita Miss

## 2020-11-09 ENCOUNTER — Encounter: Payer: Medicare Other | Attending: Physician Assistant | Admitting: Physician Assistant

## 2020-11-09 ENCOUNTER — Other Ambulatory Visit: Payer: Self-pay

## 2020-11-09 DIAGNOSIS — I509 Heart failure, unspecified: Secondary | ICD-10-CM | POA: Insufficient documentation

## 2020-11-09 DIAGNOSIS — W19XXXA Unspecified fall, initial encounter: Secondary | ICD-10-CM | POA: Diagnosis not present

## 2020-11-09 DIAGNOSIS — S9031XA Contusion of right foot, initial encounter: Secondary | ICD-10-CM | POA: Diagnosis not present

## 2020-11-09 DIAGNOSIS — L97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 with necrosis of muscle: Secondary | ICD-10-CM | POA: Insufficient documentation

## 2020-11-09 DIAGNOSIS — I132 Hypertensive heart and chronic kidney disease with heart failure and with stage 5 chronic kidney disease, or end stage renal disease: Secondary | ICD-10-CM | POA: Insufficient documentation

## 2020-11-09 DIAGNOSIS — L97811 Non-pressure chronic ulcer of other part of right lower leg limited to breakdown of skin: Secondary | ICD-10-CM | POA: Diagnosis not present

## 2020-11-09 DIAGNOSIS — Z992 Dependence on renal dialysis: Secondary | ICD-10-CM | POA: Diagnosis not present

## 2020-11-09 DIAGNOSIS — N185 Chronic kidney disease, stage 5: Secondary | ICD-10-CM | POA: Diagnosis not present

## 2020-11-09 DIAGNOSIS — E11621 Type 2 diabetes mellitus with foot ulcer: Secondary | ICD-10-CM | POA: Insufficient documentation

## 2020-11-09 DIAGNOSIS — S81812A Laceration without foreign body, left lower leg, initial encounter: Secondary | ICD-10-CM | POA: Insufficient documentation

## 2020-11-09 DIAGNOSIS — E1122 Type 2 diabetes mellitus with diabetic chronic kidney disease: Secondary | ICD-10-CM | POA: Insufficient documentation

## 2020-11-09 DIAGNOSIS — S8012XA Contusion of left lower leg, initial encounter: Secondary | ICD-10-CM | POA: Insufficient documentation

## 2020-11-09 NOTE — Progress Notes (Signed)
TRISTON, LISANTI ([id number]) Visit Report for 11/09/2020 Arrival Information Details [name], [name] Date of Service: 11/09/2020 9:00 AM Medical Record Number: [id number] Patient Account Number: [id number] Date of Birth/Sex: [birth year] (86 y.o. F) Treating RN: Dolan Amen Primary Care Vicente Weidler: Fulton Reek Other Clinician: Referring Adryel Wortmann: Fulton Reek Treating Zakk Borgen/Extender: Skipper Cliche in Treatment: 16 Visit Information History Since Last Visit Pain Present Now: Yes Patient Arrived: Wheel Chair Arrival Time: 09:02 Accompanied By: husband Transfer Assistance: None Patient Identification Verified: Yes Secondary Verification Process Completed: Yes Patient Requires Transmission-Based Precautions: No Patient Has Alerts: No Electronic Signature(s) Signed: 11/09/2020 12:13:28 PM By: Georges Mouse, Minus Breeding RN Entered By: Georges Mouse, Minus Breeding on 11/09/2020 09:02:55 [name] ([id number]) -------------------------------------------------------------------------------- Clinic Level of Care Assessment Details [name] Date of Service: 11/09/2020 9:00 AM Medical Record Number: [id number] Patient Account Number: [id number] Date of Birth/Sex: 1933-10-16 (86 y.o. F) Treating RN: Dolan Amen Primary Care Hallis Meditz: Fulton Reek Other Clinician: Referring Jacklyne Baik: Fulton Reek Treating Naomy Esham/Extender: Skipper Cliche in Treatment: 16 Clinic Level of Care Assessment Items TOOL 1 Quantity Score []  - Use when EandM and Procedure is performed on INITIAL visit 0 ASSESSMENTS - Nursing Assessment / Reassessment []  - General Physical Exam (combine w/ comprehensive assessment (listed just below) when performed on new 0 pt. evals) []  - 0 Comprehensive Assessment (HX, ROS, Risk Assessments, Wounds Hx, etc.) ASSESSMENTS - Wound and Skin Assessment / Reassessment []  - Dermatologic / Skin Assessment (not related to  wound area) 0 ASSESSMENTS - Ostomy and/or Continence Assessment and Care []  - Incontinence Assessment and Management 0 []  - 0 Ostomy Care Assessment and Management (repouching, etc.) PROCESS - Coordination of Care []  - Simple Patient / Family Education for ongoing care 0 []  - 0 Complex (extensive) Patient / Family Education for ongoing care []  - 0 Staff obtains Programmer, systems, Records, Test Results / Process Orders []  - 0 Staff telephones HHA, Nursing Homes / Clarify orders / etc []  - 0 Routine Transfer to another Facility (non-emergent condition) []  - 0 Routine Hospital Admission (non-emergent condition) []  - 0 New Admissions / Biomedical engineer / Ordering NPWT, Apligraf, etc. []  - 0 Emergency Hospital Admission (emergent condition) PROCESS - Special Needs []  - Pediatric / Minor Patient Management 0 []  - 0 Isolation Patient Management []  - 0 Hearing / Language / Visual special needs []  - 0 Assessment of Community assistance (transportation, D/C planning, etc.) []  - 0 Additional assistance / Altered mentation []  - 0 Support Surface(s) Assessment (bed, cushion, seat, etc.) INTERVENTIONS - Miscellaneous []  - External ear exam 0 []  - 0 Patient Transfer (multiple staff / Civil Service fast streamer / Similar devices) []  - 0 Simple Staple / Suture removal (25 or less) []  - 0 Complex Staple / Suture removal (26 or more) []  - 0 Hypo/Hyperglycemic Management (do not check if billed separately) []  - 0 Ankle / Brachial Index (ABI) - do not check if billed separately Has the patient been seen at the hospital within the last three years: Yes Total Score: 0 Level Of Care: ____ [name] ([id number]) Electronic Signature(s) Signed: 11/09/2020 12:13:28 PM By: Georges Mouse, Minus Breeding RN Entered By: Georges Mouse, Minus Breeding on 11/09/2020 09:49:31 [name] ([id number]) -------------------------------------------------------------------------------- Compression Therapy  Details [name] Date of Service: 11/09/2020 9:00 AM Medical Record Number: [id number] Patient Account Number: [id number] Date of Birth/Sex: 1933-10-17 (86 y.o. F) Treating RN: Dolan Amen Primary Care Katherine Syme: Fulton Reek Other Clinician: Referring Jory Welke: Fulton Reek Treating  Burkley Dech/Extender: Jeri Cos Weeks in Treatment: 16 Compression Therapy Performed for Wound Assessment: Wound #1 Right,Dorsal Foot Performed By: Clinician Dolan Amen, RN Compression Type: Three Layer Pre Treatment ABI: 1 Post Procedure Diagnosis Same as Pre-procedure Electronic Signature(s) Signed: 11/09/2020 12:13:28 PM By: Georges Mouse, Minus Breeding RN Entered By: Georges Mouse, Kenia on 11/09/2020 09:49:06 [name] ([id number]) -------------------------------------------------------------------------------- Encounter Discharge Information Details [name] Date of Service: 11/09/2020 9:00 AM Medical Record Number: [id number] Patient Account Number: [id number] Date of Birth/Sex: 22-Feb-1934 (86 y.o. F) Treating RN: Dolan Amen Primary Care Kameelah Minish: Fulton Reek Other Clinician: Referring Randilyn Foisy: Fulton Reek Treating Alsha Meland/Extender: Skipper Cliche in Treatment: 16 Encounter Discharge Information Items Discharge Condition: Stable Ambulatory Status: Wheelchair Discharge Destination: Home Transportation: Private Auto Accompanied By: spouse Schedule Follow-up Appointment: Yes Clinical Summary of Care: Electronic Signature(s) Signed: 11/09/2020 11:53:24 AM By: Georges Mouse, Minus Breeding RN Entered By: Georges Mouse, Minus Breeding on 11/09/2020 11:53:24 [name] ([id number]) -------------------------------------------------------------------------------- Lower Extremity Assessment Details [name] Date of Service: 11/09/2020 9:00 AM Medical Record Number: [id number] Patient Account Number:  [id number] Date of Birth/Sex: 08-12-1934 (86 y.o. F) Treating RN: Dolan Amen Primary Care Jesly Hartmann: Fulton Reek Other Clinician: Referring Cherisse Carrell: Fulton Reek Treating Kemiah Booz/Extender: Skipper Cliche in Treatment: 16 Edema Assessment Assessed: [Left: No] Patrice Paradise: Yes] Edema: [Left: Ye] [Right: s] Calf Left: Right: Point of Measurement: 31 cm From Medial Instep 37.5 cm Ankle Left: Right: Point of Measurement: 11 cm From Medial Instep 22.5 cm Vascular Assessment Pulses: Dorsalis Pedis Palpable: [Right:Yes] Electronic Signature(s) Signed: 11/09/2020 12:13:28 PM By: Georges Mouse, Minus Breeding RN Entered By: Georges Mouse, Minus Breeding on 11/09/2020 09:19:04 [name] ([id number]) -------------------------------------------------------------------------------- Multi Wound Chart Details [name] Date of Service: 11/09/2020 9:00 AM Medical Record Number: [id number] Patient Account Number: [id number] Date of Birth/Sex: 1933-11-24 (86 y.o. F) Treating RN: Dolan Amen Primary Care Dene Nazir: Fulton Reek Other Clinician: Referring Nephtali Docken: Fulton Reek Treating Audreyanna Butkiewicz/Extender: Skipper Cliche in Treatment: 16 Vital Signs Height(in): 64 Pulse(bpm): 96 Weight(lbs): 205 Blood Pressure(mmHg): 142/84 Body Mass Index(BMI): 35 Temperature(F): 98.3 Respiratory Rate(breaths/min): 18 Photos: [N/A:N/A] Wound Location: Right, Dorsal Foot N/A N/A Wounding Event: Blister N/A N/A Primary Etiology: Diabetic Wound/Ulcer of the Lower N/A N/A Extremity Comorbid History: Congestive Heart Failure, N/A N/A Hypertension, Type II Diabetes, Received Radiation Date Acquired: 07/02/2020 N/A N/A Weeks of Treatment: 16 N/A N/A Wound Status: Open N/A N/A Measurements L x W x D (cm) 1x0.5x0.4 N/A N/A Area (cm) : 0.393 N/A N/A Volume (cm) : 0.157 N/A N/A % Reduction in Area: 98.50% N/A N/A % Reduction in Volume: 93.90% N/A N/A Starting Position 1  (o'clock): 12 Ending Position 1 (o'clock): 3 Maximum Distance 1 (cm): 1.5 Undermining: Yes N/A N/A Classification: Grade 2 N/A N/A Exudate Amount: Medium N/A N/A Exudate Type: Serosanguineous N/A N/A Exudate Color: red, brown N/A N/A Wound Margin: Flat and Intact N/A N/A Granulation Amount: Small (1-33%) N/A N/A Granulation Quality: Red N/A N/A Necrotic Amount: Medium (34-66%) N/A N/A Exposed Structures: Fat Layer (Subcutaneous Tissue): N/A N/A Yes Fascia: No Tendon: No Muscle: No Joint: No Bone: No Epithelialization: Large (67-100%) N/A N/A Treatment Notes Electronic Signature(s) Signed: 11/09/2020 12:13:28 PM By: Charlett Nose RN [place], Shakeema T. ([id number]) Entered By: Charlett Nose on 11/09/2020 09:28:03 [name] ([id number]) -------------------------------------------------------------------------------- Multi-Disciplinary Care Plan Details [name] Date of Service: 11/09/2020 9:00 AM Medical Record Number: [id number] Patient Account Number: [id number] Date of Birth/Sex: 10/14/1933 (87 y.o. F) Treating  RN: Dolan Amen Primary Care Deaven Barron: Fulton Reek Other Clinician: Referring Diasia Henken: Fulton Reek Treating Calieb Lichtman/Extender: Skipper Cliche in Treatment: 16 Active Inactive Necrotic Tissue Nursing Diagnoses: Impaired tissue integrity related to necrotic/devitalized tissue Goals: Necrotic/devitalized tissue will be minimized in the wound bed Date Initiated: 07/14/2020 Target Resolution Date: 09/29/2020 Goal Status: Active Interventions: Assess patient pain level pre-, during and post procedure and prior to discharge Treatment Activities: Apply topical anesthetic as ordered : 07/14/2020 Notes: Electronic Signature(s) Signed: 11/09/2020 12:13:28 PM By: Georges Mouse, Minus Breeding RN Entered By: Georges Mouse, Minus Breeding on 11/09/2020 09:27:55 [name]  ([id number]) -------------------------------------------------------------------------------- Pain Assessment Details [name] Date of Service: 11/09/2020 9:00 AM Medical Record Number: [id number] Patient Account Number: [id number] Date of Birth/Sex: 01-12-1934 (86 y.o. F) Treating RN: Dolan Amen Primary Care Khan Chura: Fulton Reek Other Clinician: Referring Ehab Humber: Fulton Reek Treating Ezrah Dembeck/Extender: Skipper Cliche in Treatment: 16 Active Problems Location of Pain Severity and Description of Pain Patient Has Paino Yes Site Locations Pain Location: Generalized Pain Rate the pain. Current Pain Level: 8 Pain Management and Medication Current Pain Management: Notes Patient had arm surgery Electronic Signature(s) Signed: 11/09/2020 12:13:28 PM By: Georges Mouse, Minus Breeding RN Entered By: Georges Mouse, Kenia on 11/09/2020 09:05:31 [name] ([id number]) -------------------------------------------------------------------------------- Wound Assessment Details [name] Date of Service: 11/09/2020 9:00 AM Medical Record Number: [id number] Patient Account Number: [id number] Date of Birth/Sex: [birth year] (86 y.o. F) Treating RN: Dolan Amen Primary Care Lyncoln Maskell: Fulton Reek Other Clinician: Referring Prayan Ulin: Fulton Reek Treating Devontay Celaya/Extender: Skipper Cliche in Treatment: 16 Wound Status Wound Number: 1 Primary Diabetic Wound/Ulcer of the Lower Extremity Etiology: Wound Location: Right, Dorsal Foot Wound Status: Open Wounding Event: Blister Comorbid Congestive Heart Failure, Hypertension, Type II Date Acquired: 07/02/2020 History: Diabetes, Received Radiation Weeks Of Treatment: 16 Clustered Wound: No Photos Wound Measurements Length: (cm) 1 % Reduct Width: (cm) 0.5 % Reduct Depth: (cm) 0.4 Epitheli Area: (cm) 0.393 Tunneli Volume: (cm) 0.157 Undermi Start Endin Maxim ion in  Area: 98.5% ion in Volume: 93.9% alization: Large (67-100%) ng: No ning: Yes ing Position (o'clock): 12 g Position (o'clock): 3 um Distance: (cm) 1.5 Wound Description Classification: Grade 2 Foul Od Wound Margin: Flat and Intact Slough/ Exudate Amount: Medium Exudate Type: Serosanguineous Exudate Color: red, brown or After Cleansing: No Fibrino Yes Wound Bed Granulation Amount: Small (1-33%) Exposed Structure Granulation Quality: Red Fascia Exposed: No Necrotic Amount: Medium (34-66%) Fat Layer (Subcutaneous Tissue) Exposed: Yes Necrotic Quality: Adherent Slough Tendon Exposed: No Muscle Exposed: No Joint Exposed: No Bone Exposed: No Treatment Notes Wound #1 (Foot) Wound Laterality: Dorsal, Right Cleanser Castanon, Reniah T. ([id number]) Peri-Wound Care Topical Primary Dressing Silvercel Small 2x2 (in/in) Discharge Instruction: Apply Silvercel Small 2x2 (in/in)-cut and tuck in the space Secondary Dressing ABD Pad 5x9 (in/in) Discharge Instruction: Cover with ABD pad Secured With Compression Wrap Profore Lite LF 3 Multilayer Compression Bandaging System Discharge Instruction: Apply 3 multi-layer wrap as prescribed. Unna w/Zinc, 4x10 (in/yd) Discharge Instruction: Apply unna paste -3 finger-widths below knee to anchor compression wrap in place. Compression Stockings Add-Ons Electronic Signature(s) Signed: 11/09/2020 12:13:28 PM By: Georges Mouse, Minus Breeding RN Entered By: Georges Mouse, Minus Breeding on 11/09/2020 09:16:38 [name] ([id number]) -------------------------------------------------------------------------------- Vitals Details [name] Date of Service: 11/09/2020 9:00 AM Medical Record Number: [id number] Patient Account Number: [id number] Date of Birth/Sex: 01/09/1934 (86 y.o. F) Treating RN: Dolan Amen Primary Care Sunnie Odden: Fulton Reek Other Clinician: Referring Oceane Fosse: Fulton Reek Treating Murriel Holwerda/Extender:  Jeri Cos Weeks in Treatment: 16 Vital Signs Time Taken: 09:02 Temperature (F): 98.3 Height (in): 64 Pulse (bpm): 96 Weight (lbs): 205 Respiratory Rate (breaths/min): 18 Body Mass Index (BMI): 35.2 Blood Pressure (mmHg): 142/84 Reference Range: 80 - 120 mg / dl Electronic Signature(s) Signed: 11/09/2020 12:13:28 PM By: Georges Mouse, Minus Breeding RN Entered By: Georges Mouse, Minus Breeding on 11/09/2020 09:05:07

## 2020-11-09 NOTE — Progress Notes (Addendum)
JENNET, SCROGGIN ([id number]) Visit Report for 11/09/2020 Chief Complaint Document Details [name], [name] Date of Service: 11/09/2020 9:00 AM Medical Record Number: [id number] Patient Account Number: [id number] Date of Birth/Sex: [birth year] (86 y.o. F) Treating RN: Dolan Amen Primary Care Provider: Fulton Reek Other Clinician: Referring Provider: Fulton Reek Treating Provider/Extender: Skipper Cliche in Treatment: 16 Information Obtained from: Patient Chief Complaint 07/14/2020; patient sent over here for a hemorrhagic blister on the right dorsal foot by her primary doctor Dr. Doy Hutching at [facility]. Electronic Signature(s) Signed: 11/09/2020 9:19:59 AM By: Worthy Keeler PA-C Entered By: Worthy Keeler on 11/09/2020 09:19:59 [name] ([id number]) -------------------------------------------------------------------------------- HPI Details [name] Date of Service: 11/09/2020 9:00 AM Medical Record Number: [id number] Patient Account Number: [id number] Date of Birth/Sex: [birth year] (86 y.o. F) Treating RN: Dolan Amen Primary Care Provider: Fulton Reek Other Clinician: Referring Provider: Fulton Reek Treating Provider/Extender: Skipper Cliche in Treatment: 16 History of Present Illness HPI Description: ADMISSION 07/14/2020. This is a medically complex 87 year old woman who is sent over urgently from her primary care doctor Dr. Doy Hutching at [facility] and we worked her in this afternoon. She is apparently developed a blister on the dorsal foot sometime over the last 2 weeks on the right which seems to have developed a hemorrhagic component to this. The history here is somewhat vague but this is happened since she left the hospital on 07/02/2020. She also has a blistered area on the left dorsal foot although this does not have a hemorrhagic component to it and it is not threatened to open where is the right  side is definitely leaking fluid and could easily break down. I do not believe she has been doing anything specific to this. With regards to her medical history I have reviewed the discharge summary from the hospital from 06/22/2020 through 07/02/2020. 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 She was given prednisone but was not felt to be a candidate for immunosuppression secondary to her age and comorbidities. She is ANA positive p-ANCA positive. She had a right IJ catheter placed on 9/24 and is being done on dialysis. She is a diabetic. She takes prednisone 20 mg twice daily. She was on aspirin and Plavix although I think these have been put on hold. Past medical history includes; pauci-immune crescentic glomerulonephritis. History of peripheral edema and type 2 diabetes left carotid artery stenosis with a history of a CVA, coronary artery disease and hypertension I cannot see any arterial evaluation in her record in [facility]. We could not do arterial ABIs on her because of complaints of discomfort 10/20; 1 week follow-up. This is a patient who is a diabetic. She recently developed acute renal failure because of pauci-immune glomerulonephritis. Sometime during her initial hospitalization she developed a wound on her right foot tremendous swelling in both legs. She was sent over here urgently last week by her primary care physician with what looks to be hematoma still contained on the right dorsal foot. We put silver alginate on this and put her in compression to control some of the swelling. The left leg had a similar appearance with severe edema in the left dorsal foot so we put that under compression as well. When she arrived back for nurse follow-up on Friday there was some drainage coming out of the wound which our nurse cultured. This is come back showing Pseudomonas Enterococcus faecalis and staph  aureus although the identification of the staph aureus is not complete. I empirically put her on cefdinir 300 mg after dialysis on Tuesday Thursday and Saturday she is only had one dose. She has not been systemically unwell 10/27 1 week follow-up. With considerable help of her nursing staff I was finally able to speak to her nephrologist who manages her at dialysis. Started her on vancomycin and ceftazidime. This to cover the combination of methicillin sensitive staph aureus Enterococcus faecalis and Pseudomonas. Although the staph aureus was methicillin sensitive and the Enterococcus ampicillin sensitive vancomycin provided the best alternative here that can be given at dialysis and ceftazidime to cover the Pseudomonas. She had a traumatic wound on the top of her foot and a complicated abscess. She has significant tissue destruction from this although the wound is looking a lot better today. We are using silver alginate to the surface. X-ray of the foot did not show any deep obvious infection or osteomyelitis 12/1; since the patient was last here she was hospitalized from 11/3 through 11/10 with coag negative staphylococcal bacteremia. I do not know the the exact cause of this was determined. She was seen by vascular and they replaced her permacath on the right side of her neck. Her repeat blood cultures were negative. She is still on IV vancomycin ando Ceftazidime at dialysis. She generally feels systemically well she does not have a fever she is still receiving dialysis but reports she voids up to 8 times a day She has home health changing the dressing I believe they are using silver alginate under 3 layer compression. In addition to the large wound on her right anterior foot that we initially saw her for she has apparently a right lateral leg wound that she suffered during a fall and 2 skin tears on the left anterior lower leg 12/8; patient comes in looking a lot better this week. One of the 2  skin tears she has on the left leg is healed the other is smaller. The area on the right lateral leg looks improved and her original wound on the right dorsal foot is a lot smaller and looks a lot better. We used Hydrofera Blue to all wound areas under 3 layer compression.. Apparently home health had some trouble with the compression wraps. 12/15 patient comes in with all of her wounds smaller including her original deep punched out wound on the right dorsal foot as well as the skin tears on the right posterior lateral calf and the left anterior lower leg. We have been using Hydrofera Blue under compression excellent results 10/12/2020 upon evaluation today patient appears to be doing decently well currently in regard to her foot ulcer. She does have a small hole still open with some tunneling that really goes proximally in the foot at around the 12:00 location. There is really not significant undermining other locations. This appears to be a small area of where there was a little fluid trapped. Nonetheless I do feel like this is still doing quite well and in general I think that she is going to benefit from possibly packing into this area with endoform in order to keep the area open while it granulates in. 11/09/2020 upon evaluation today patient appears to be doing really about the same in regard to her wound. There is a little reopening towards the distal portion of the wound bed but again I think this is just more as a result of the dressing possibly getting stuck even to little bit of  the scar tissue nonetheless it also anything looks worse although I am not sure the endoform is really doing the job for her. I think we may want to try something a little different here I think packing with a silver alginate dressing may be optimal as compared to what we tried at this point. Is been almost a month since have seen her and we really have not seen dramatic improvement. Electronic Signature(s) Signed:  11/09/2020 5:48:46 PM By: Gerlene Fee, Davonne T. ([id number]) Entered By: Worthy Keeler on 11/09/2020 17:48:46 [name] ([id number]) -------------------------------------------------------------------------------- Physical Exam Details [name] Date of Service: 11/09/2020 9:00 AM Medical Record Number: [id number] Patient Account Number: [id number] Date of Birth/Sex: June 18, 1934 (86 y.o. F) Treating RN: Dolan Amen Primary Care Provider: Fulton Reek Other Clinician: Referring Provider: Fulton Reek Treating Provider/Extender: Skipper Cliche in Treatment: 1 Constitutional Well-nourished and well-hydrated in no acute distress. Respiratory normal breathing without difficulty. Psychiatric this patient is able to make decisions and demonstrates good insight into disease process. Alert and Oriented x 3. pleasant and cooperative. Notes Upon inspection patient's wound bed actually showed signs of good granulation at this time. There did not appear to be any signs of active infection no sharp debridement was necessary and overall very pleased with where things stand. She still has a bit of healing to do here but I do not think that this is too significant which is great news. I think that she is getting much closer to complete resolution which is excellent. Electronic Signature(s) Signed: 11/09/2020 5:49:13 PM By: Worthy Keeler PA-C Entered By: Worthy Keeler on 11/09/2020 17:49:12 [name] ([id number]) -------------------------------------------------------------------------------- Physician Orders Details [name] Date of Service: 11/09/2020 9:00 AM Medical Record Number: [id number] Patient Account Number: [id number] Date of Birth/Sex: 06-18-1934 (86 y.o. 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 [place]: - [place] for wound care. May utilize formulary equivalent dressing for wound treatment orders unless otherwise specified. Home Health Nurse may visit PRN to address patientos wound care needs. o Scheduled days for dressing changes to be completed; exception, patient has scheduled wound care visit that day. o **Please direct any NON-WOUND related issues/requests for orders to patient's Primary Care Physician. **If current dressing causes regression in wound condition, may D/C ordered dressing product/s and apply Normal Saline Moist Dressing daily until next [place] or Other MD appointment. **Notify [facility]aling [facility] of regression in wound condition at [phone]. Bathing/ Shower/ Hygiene o May shower with wound dressing protected with water repellent cover or cast protector. Edema Control - Lymphedema / Segmental Compressive Device / Other Right Lower Extremity o Optional: One layer of unna paste to top of compression wrap (to act as an anchor). o 3 Layer Compression System for Lymphedema. o Elevate legs to the level of the heart and pump ankles  as often as possible o Elevate leg(s) parallel to the floor when sitting. Additional Orders / Instructions o Follow Nutritious Diet and Increase Protein Intake Wound  Treatment Wound #1 - Foot Wound Laterality: Dorsal, Right Primary Dressing: Silvercel Small 2x2 (in/in) 2 x Per Week/30 Days Discharge Instructions: Apply Silvercel Small 2x2 (in/in)-cut and tuck in the space Secondary Dressing: ABD Pad 5x9 (in/in) 2 x Per Week/30 Days Discharge Instructions: Cover with ABD pad Compression Wrap: Profore Lite LF 3 Multilayer Compression Bandaging System 2 x Per Week/30 Days Discharge Instructions: Apply 3 multi-layer wrap as prescribed. Compression Wrap: Unna w/Zinc, 4x10 (in/yd) 2 x Per Week/30 Days Discharge Instructions: Apply unna paste -3 finger-widths below knee to anchor compression wrap in place. Electronic Signature(s) Signed: 11/09/2020 12:13:28 PM By: Georges Mouse, Minus Breeding RN Signed: 11/09/2020 6:05:44 PM By: Gerlene Fee, Jmya T. ([id number]) Entered By: Georges Mouse, Minus Breeding on 11/09/2020 11:54:14 [name] ([id number]) -------------------------------------------------------------------------------- Problem List Details [name] Date of Service: 11/09/2020 9:00 AM Medical Record Number: [id number] Patient Account Number: [id number] Date of Birth/Sex: 1933/10/15 (87 y.o. F) Treating RN: Dolan Amen Primary Care Provider: Fulton Reek Other Clinician: Referring Provider: Fulton Reek Treating Provider/Extender: Skipper Cliche in Treatment: 16 Active Problems ICD-10 Encounter Code Description Active Date MDM Diagnosis S90.31XD Contusion of right foot, subsequent encounter 07/14/2020 No Yes L97.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 with necrosis of 07/21/2020 No Yes muscle L97.811 Non-pressure chronic ulcer of other part of right lower leg limited to 09/01/2020 No Yes breakdown of skin S80.12XD Contusion of left lower leg, subsequent encounter 09/01/2020 No Yes E11.22 Type 2 diabetes mellitus with diabetic chronic kidney disease 07/14/2020 No Yes N18.5 Chronic  kidney disease, stage 5 07/14/2020 No Yes Inactive Problems ICD-10 Code Description Active Date Inactive Date S90.822D Blister (nonthermal), left foot, subsequent encounter 07/14/2020 07/14/2020 Resolved Problems Electronic Signature(s) Signed: 11/09/2020 9:19:49 AM By: Worthy Keeler PA-C Entered By: Worthy Keeler on 11/09/2020 09:19:49 [name] ([id number]) -------------------------------------------------------------------------------- Progress Note Details [name] Date of Service: 11/09/2020 9:00 AM Medical Record Number: [id number] Patient Account Number: [id number] Date of Birth/Sex: 1933-11-13 (86 y.o. F) Treating RN: Dolan Amen Primary Care Provider: Fulton Reek Other Clinician: Referring Provider: Fulton Reek Treating Provider/Extender: Skipper Cliche in Treatment: 16 Subjective Chief Complaint Information obtained from Patient 07/14/2020; patient sent over here for a hemorrhagic blister on the right dorsal foot by her primary doctor Dr. Doy Hutching at [facility]. History of Present Illness (HPI) ADMISSION 07/14/2020. This is a medically complex 87 year old woman who is sent over urgently from her primary care doctor Dr. Doy Hutching at [facility] and we worked her in this afternoon. She is apparently developed a blister on the dorsal foot sometime over the last 2 weeks on the right which seems to have developed a hemorrhagic component to this. The history here is somewhat vague but this is happened since she left the hospital on 07/02/2020. She also has a blistered area on the left dorsal foot although this does not have a hemorrhagic component to it and it is not threatened to open where is the right side is definitely leaking fluid and could easily break down. I do not believe she has been doing anything specific to this. With regards to her medical history I have reviewed the discharge summary from the hospital from 06/22/2020  through 07/02/2020. 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 She was given prednisone but was not felt to be a candidate for immunosuppression secondary to her age and comorbidities. She is ANA positive p-ANCA positive. She had a right IJ catheter placed on 9/24 and is being done on dialysis. She is a diabetic. She takes prednisone 20 mg twice daily. She was on aspirin and Plavix although I think these have been put on hold. Past medical history includes; pauci-immune crescentic glomerulonephritis. History of peripheral edema and type 2 diabetes left carotid artery stenosis with a history of a CVA, coronary artery disease and hypertension I cannot see any arterial evaluation in her record in [facility]. We could not do arterial ABIs on her because of complaints of discomfort 10/20; 1 week follow-up. This is a patient who is a diabetic. She recently developed acute renal failure because of pauci-immune glomerulonephritis. Sometime during her initial hospitalization she developed a wound on her right foot tremendous swelling in both legs. She was sent over here urgently last week by her primary care physician with what looks to be hematoma still contained on the right dorsal foot. We put silver alginate on this and put her in compression to control some of the swelling. The left leg had a similar appearance with severe edema in the left dorsal foot so we put that under compression as well. When she arrived back for nurse follow-up on Friday there was some drainage coming out of the wound which our nurse cultured. This is come back showing Pseudomonas Enterococcus faecalis and staph aureus although the identification of the staph aureus is not complete. I empirically put her on cefdinir 300 mg after dialysis on Tuesday Thursday and Saturday she is only had one dose. She has not been systemically unwell 10/27  1 week follow-up. With considerable help of her nursing staff I was finally able to speak to her nephrologist who manages her at dialysis. Started her on vancomycin and ceftazidime. This to cover the combination of methicillin sensitive staph aureus Enterococcus faecalis and Pseudomonas. Although the staph aureus was methicillin sensitive and the Enterococcus ampicillin sensitive vancomycin provided the best alternative here that can be given at dialysis and ceftazidime to cover the Pseudomonas. She had a traumatic wound on the top of her foot and a complicated abscess. She has significant tissue destruction from this although the wound is looking a lot better today. We are using silver alginate to the surface. X-ray of the foot did not show any deep obvious infection or osteomyelitis 12/1; since the patient was last here she was hospitalized from 11/3 through 11/10 with coag negative staphylococcal bacteremia. I do not know the the exact cause of this was determined. She was seen by vascular and they replaced her permacath on the right side of her neck. Her repeat blood cultures were negative. She is still on IV vancomycin ando Ceftazidime at dialysis. She generally feels systemically well she does not have a fever she is still receiving dialysis but reports she voids up to 8 times a day She has home health changing the dressing I believe they are using silver alginate under 3 layer compression. In addition to the large wound on her right anterior foot that we initially saw her for she has apparently a right lateral leg wound that she suffered during a fall and 2 skin tears on the left anterior lower leg 12/8; patient comes in looking a lot better this week. One of the 2 skin tears she has on the left  leg is healed the other is smaller. The area on the right lateral leg looks improved and her original wound on the right dorsal foot is a lot smaller and looks a lot better. We used Hydrofera Blue  to all wound areas under 3 layer compression.. Apparently home health had some trouble with the compression wraps. 12/15 patient comes in with all of her wounds smaller including her original deep punched out wound on the right dorsal foot as well as the skin tears on the right posterior lateral calf and the left anterior lower leg. We have been using Hydrofera Blue under compression excellent results 10/12/2020 upon evaluation today patient appears to be doing decently well currently in regard to her foot ulcer. She does have a small hole still open with some tunneling that really goes proximally in the foot at around the 12:00 location. There is really not significant undermining other locations. This appears to be a small area of where there was a little fluid trapped. Nonetheless I do feel like this is still doing quite well and in general I think that she is going to benefit from possibly packing into this area with endoform in order to keep the area open while it granulates in. 11/09/2020 upon evaluation today patient appears to be doing really about the same in regard to her wound. There is a little reopening towards the distal portion of the wound bed but again I think this is just more as a result of the dressing possibly getting stuck even to little bit of the scar tissue nonetheless it also anything looks worse although I am not sure the endoform is really doing the job for her. I think we may want to try something a little different here I think packing with a silver alginate dressing may be optimal as compared to what we tried at this point. Is been almost a month since have seen her and we really have not seen dramatic improvement. [name] ([id number]) Objective Constitutional Well-nourished and well-hydrated in no acute distress. Vitals Time Taken: 9:02 AM, Height: 64 in, Weight: 205 lbs, BMI: 35.2, Temperature: 98.3 F, Pulse: 96 bpm, Respiratory Rate: 18  breaths/min, Blood Pressure: 142/84 mmHg. Respiratory normal breathing without difficulty. Psychiatric this patient is able to make decisions and demonstrates good insight into disease process. Alert and Oriented x 3. pleasant and cooperative. General Notes: Upon inspection patient's wound bed actually showed signs of good granulation at this time. There did not appear to be any signs of active infection no sharp debridement was necessary and overall very pleased with where things stand. She still has a bit of healing to do here but I do not think that this is too significant which is great news. I think that she is getting much closer to complete resolution which is excellent. Integumentary (Hair, Skin) Wound #1 status is Open. Original cause of wound was Blister. The wound is located on the Right,Dorsal Foot. The wound measures 1cm length x 0.5cm width x 0.4cm depth; 0.393cm^2 area and 0.157cm^3 volume. There is Fat Layer (Subcutaneous Tissue) exposed. There is no tunneling noted, however, there is undermining starting at 12:00 and ending at 3:00 with a maximum distance of 1.5cm. There is a medium amount of serosanguineous drainage noted. The wound margin is flat and intact. There is small (1-33%) red granulation within the wound bed. There is a medium (34-66%) amount of necrotic tissue within the wound bed including Adherent Slough. Assessment Active Problems ICD-10 Contusion of right  foot, subsequent encounter Non-pressure chronic ulcer of other part of right foot with necrosis of muscle Non-pressure chronic ulcer of other part of right lower leg limited to breakdown of skin Contusion of left lower leg, subsequent encounter Type 2 diabetes mellitus with diabetic chronic kidney disease Chronic kidney disease, stage 5 Procedures Wound #1 Pre-procedure diagnosis of Wound #1 is a Diabetic Wound/Ulcer of the Lower Extremity located on the Right,Dorsal Foot . There was a Three Layer  Compression Therapy Procedure with a pre-treatment ABI of 1 by Dolan Amen, RN. Post procedure Diagnosis Wound #1: Same as Pre-Procedure Plan Follow-up Appointments: Return Appointment in 1 week. Home Health: [place]: [address] [place], [place] T. ([id number]) [place] for wound care. May utilize formulary equivalent dressing for wound treatment orders unless otherwise specified. Home Health Nurse may visit [facility] to address patient s wound care needs. Scheduled days for dressing changes to be completed; exception, patient has scheduled wound care visit that day. **Please direct any NON-WOUND related issues/requests for orders to patient's Primary Care Physician. **If current dressing causes regression in wound condition, may D/C ordered dressing product/s and apply Normal Saline Moist Dressing daily until next [place] or Other MD appointment. **Notify [facility] of regression in wound condition at [phone]. Bathing/ Shower/ Hygiene: May shower with wound dressing protected with water repellent cover or cast protector. Edema Control - Lymphedema / Segmental Compressive Device / Other: Optional: One layer of unna paste to top of compression wrap (to act as an anchor). 3 Layer Compression System for Lymphedema. Elevate legs to the level of the heart and pump ankles as often as possible Elevate leg(s) parallel to the floor when sitting. Additional Orders / Instructions: Follow Nutritious Diet and Increase Protein Intake WOUND #1: - Foot Wound Laterality: Dorsal, Right Primary Dressing: Silvercel Small 2x2 (in/in) 2 x Per Week/30 Days Discharge Instructions: Apply Silvercel Small 2x2 (in/in)-cut and tuck in the space Secondary Dressing: ABD Pad 5x9 (in/in) 2 x Per Week/30 Days Discharge Instructions: Cover with ABD pad Compression Wrap: Profore Lite LF 3 Multilayer Compression Bandaging System 2 x Per Week/30 Days Discharge Instructions:  Apply 3 multi-layer wrap as prescribed. Compression Wrap: Unna w/Zinc, 4x10 (in/yd) 2 x Per Week/30 Days Discharge Instructions: Apply unna paste -3 finger-widths below knee to anchor compression wrap in place. 1. Would recommend at this time that we have the patient go ahead and continue with the compression wrap I think that has been beneficial for her I am going to suggest however that we use silver alginate into the wound packed lightly. 2. I am also can recommend that we have the patient continue to elevate her legs and again the compression wrap and specifically a three layer compression wrap. 3. I would also recommend we continue to monitor for any signs of infection right now I do not see any evidence of an issue but nonetheless we will keep a close eye on this. We will see patient back for reevaluation in 1 week here in the clinic. If anything worsens or changes patient will contact our office for additional recommendations. Electronic Signature(s) Signed: 11/09/2020 5:50:04 PM By: Worthy Keeler PA-C Entered By: Worthy Keeler on 11/09/2020 17:50:04 [name] ([id number]) -------------------------------------------------------------------------------- SuperBill Details [name] Date of Service: 11/09/2020 Medical Record Number: [id number] Patient Account Number: [id number] Date of Birth/Sex: 08/10/1934 (86 y.o. 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 S80.12XD Contusion of left lower leg, subsequent encounter E11.22 Type  2 diabetes mellitus with diabetic chronic kidney disease N18.5 Chronic kidney disease, stage 5 Facility Procedures CPT4 Code: 79892119 Description: (Facility Use Only) [id number] - APPLY MULTLAY COMPRS LWR RT LEG Modifier: Quantity: 1 Physician Procedures CPT4 Code: 4481856 Description: 31497 - WC PHYS LEVEL 3 - EST PT Modifier: Quantity: 1 CPT4 Code: Description: ICD-10 Diagnosis Description S90.31XD Contusion of right foot, subsequent encounter L97.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 with necrosis of mu L97.811 Non-pressure chronic ulcer of other part of right lower leg  limited to bre S80.12XD Contusion of left lower leg, subsequent encounter Modifier: scle akdown of skin Quantity: Electronic Signature(s) Signed: 11/09/2020 5:50:19 PM By: Worthy Keeler PA-C Previous Signature: 11/09/2020 12:13:28 PM Version By: Georges Mouse, Minus Breeding RN Entered By: Worthy Keeler on 11/09/2020 17:50:19

## 2020-11-15 ENCOUNTER — Telehealth (INDEPENDENT_AMBULATORY_CARE_PROVIDER_SITE_OTHER): Payer: Self-pay

## 2020-11-16 ENCOUNTER — Ambulatory Visit: Payer: Medicare Other | Admitting: Physician Assistant

## 2020-11-16 ENCOUNTER — Other Ambulatory Visit: Payer: Self-pay

## 2020-11-16 ENCOUNTER — Other Ambulatory Visit
Admission: RE | Admit: 2020-11-16 | Discharge: 2020-11-16 | Disposition: A | Discharge status: Home / Self Care | Payer: Medicare Other | Source: Ambulatory Visit | Attending: Vascular Surgery | Admitting: Vascular Surgery

## 2020-11-16 DIAGNOSIS — Z01812 Encounter for preprocedural laboratory examination: Secondary | ICD-10-CM | POA: Diagnosis present

## 2020-11-16 DIAGNOSIS — Z20822 Contact with and (suspected) exposure to covid-19: Secondary | ICD-10-CM | POA: Diagnosis not present

## 2020-11-16 LAB — SARS CORONAVIRUS 2 (TAT 6-24 HRS): SARS Coronavirus 2: NEGATIVE

## 2020-11-16 NOTE — Pre-Procedure Instructions (Signed)
Patient was covid tested today for her upcoming procedure.  She has a positive test in epic from 10/19/20.  She actually did not need to be swabbed today.

## 2020-11-18 ENCOUNTER — Other Ambulatory Visit (INDEPENDENT_AMBULATORY_CARE_PROVIDER_SITE_OTHER): Payer: Self-pay | Admitting: Nurse Practitioner

## 2020-11-18 ENCOUNTER — Ambulatory Visit
Admission: RE | Admit: 2020-11-18 | Discharge: 2020-11-18 | Disposition: A | Discharge status: Home / Self Care | Payer: Medicare Other | Source: Ambulatory Visit | Attending: Vascular Surgery | Admitting: Vascular Surgery

## 2020-11-18 ENCOUNTER — Other Ambulatory Visit: Payer: Self-pay

## 2020-11-18 ENCOUNTER — Ambulatory Visit: Payer: Medicare Other | Admitting: Physician Assistant

## 2020-11-18 DIAGNOSIS — N186 End stage renal disease: Secondary | ICD-10-CM | POA: Diagnosis present

## 2020-11-18 MED ORDER — HYDROMORPHONE HCL 1 MG/ML IJ SOLN: 1.0000 mg | Freq: Once | INTRAMUSCULAR | Status: DC | PRN

## 2020-11-18 MED ORDER — MIDAZOLAM HCL 2 MG/ML PO SYRP: 8.0000 mg | ORAL_SOLUTION | Freq: Once | ORAL | Status: DC | PRN

## 2020-11-18 MED ORDER — METHYLPREDNISOLONE SODIUM SUCC 125 MG IJ SOLR: 125.0000 mg | Freq: Once | INTRAMUSCULAR | Status: DC | PRN

## 2020-11-18 MED ORDER — HEPARIN SODIUM (PORCINE) 5000 UNIT/ML IJ SOLN
INTRAMUSCULAR | Status: AC
  Filled 2020-11-18: qty 2

## 2020-11-18 MED ORDER — SODIUM CHLORIDE 0.9 % IV SOLN: INTRAVENOUS | Status: DC

## 2020-11-18 MED ORDER — FAMOTIDINE 20 MG PO TABS: 40.0000 mg | ORAL_TABLET | Freq: Once | ORAL | Status: DC | PRN

## 2020-11-18 MED ORDER — SODIUM CHLORIDE 0.9 % IV SOLN
5.0000 mg | Freq: Once | INTRAVENOUS | Status: AC
  Administered 2020-11-18: 5 mg via INTRAVENOUS
  Filled 2020-11-18: qty 5

## 2020-11-18 MED ORDER — DIPHENHYDRAMINE HCL 50 MG/ML IJ SOLN: 50.0000 mg | Freq: Once | INTRAMUSCULAR | Status: DC | PRN

## 2020-11-18 MED ORDER — ONDANSETRON HCL 4 MG/2ML IJ SOLN: 4.0000 mg | Freq: Four times a day (QID) | INTRAMUSCULAR | Status: DC | PRN

## 2020-11-18 MED ORDER — CEFAZOLIN SODIUM-DEXTROSE 1-4 GM/50ML-% IV SOLN: 1.0000 g | Freq: Once | INTRAVENOUS | Status: DC

## 2020-11-23 NOTE — Telephone Encounter (Signed)
Patient was given the information that she didn't need to do a covid test.

## 2020-11-25 ENCOUNTER — Ambulatory Visit: Payer: Medicare Other | Admitting: Physician Assistant

## 2020-12-02 ENCOUNTER — Ambulatory Visit: Payer: Medicare Other | Admitting: Physician Assistant

## 2020-12-07 ENCOUNTER — Encounter: Payer: Medicare Other | Attending: Physician Assistant | Admitting: Physician Assistant

## 2020-12-07 ENCOUNTER — Other Ambulatory Visit: Payer: Self-pay

## 2020-12-07 DIAGNOSIS — E1122 Type 2 diabetes mellitus with diabetic chronic kidney disease: Secondary | ICD-10-CM | POA: Diagnosis not present

## 2020-12-07 DIAGNOSIS — I132 Hypertensive heart and chronic kidney disease with heart failure and with stage 5 chronic kidney disease, or end stage renal disease: Secondary | ICD-10-CM | POA: Diagnosis not present

## 2020-12-07 DIAGNOSIS — Z992 Dependence on renal dialysis: Secondary | ICD-10-CM | POA: Insufficient documentation

## 2020-12-07 DIAGNOSIS — E1151 Type 2 diabetes mellitus with diabetic peripheral angiopathy without gangrene: Secondary | ICD-10-CM | POA: Insufficient documentation

## 2020-12-07 DIAGNOSIS — I509 Heart failure, unspecified: Secondary | ICD-10-CM | POA: Diagnosis not present

## 2020-12-07 DIAGNOSIS — L97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 with necrosis of muscle: Secondary | ICD-10-CM | POA: Diagnosis not present

## 2020-12-07 DIAGNOSIS — L97811 Non-pressure chronic ulcer of other part of right lower leg limited to breakdown of skin: Secondary | ICD-10-CM | POA: Diagnosis not present

## 2020-12-07 DIAGNOSIS — Z7902 Long term (current) use of antithrombotics/antiplatelets: Secondary | ICD-10-CM | POA: Insufficient documentation

## 2020-12-07 DIAGNOSIS — N186 End stage renal disease: Secondary | ICD-10-CM | POA: Insufficient documentation

## 2020-12-07 DIAGNOSIS — E11621 Type 2 diabetes mellitus with foot ulcer: Secondary | ICD-10-CM | POA: Insufficient documentation

## 2020-12-07 NOTE — Progress Notes (Addendum)
LITITIA, SEN ([id number]) Visit Report for 12/07/2020 Chief Complaint Document Details Patient Name: Sarah, Weiss Date of Service: 12/07/2020 9:45 AM Medical Record Number: [id number] Patient Account Number: [id number] Date of Birth/Sex: 09/08/1934 (86 y.o. F) Treating RN: Dolan Amen Primary Care Provider: Fulton Reek Other Clinician: Jeanine Luz Referring Provider: Fulton Reek Treating Provider/Extender: Skipper Cliche in Treatment: 20 Information Obtained from: Patient Chief Complaint 07/14/2020; patient sent over here for a hemorrhagic blister on the right dorsal foot by her primary doctor Dr. Doy Hutching at [facility]. Electronic Signature(s) Signed: 12/07/2020 9:50:47 AM By: Worthy Keeler PA-C Entered By: Worthy Keeler on 12/07/2020 09:50:46 [name] ([id number]) -------------------------------------------------------------------------------- HPI Details [name] Date of Service: 12/07/2020 9:45 AM Medical Record Number: [id number] Patient Account Number: [id number] Date of Birth/Sex: 1934-03-10 (86 y.o. F) Treating RN: Dolan Amen Primary Care Provider: Fulton Reek Other Clinician: Jeanine Luz Referring Provider: Fulton Reek Treating Provider/Extender: Skipper Cliche in Treatment: 20 History of Present Illness HPI Description: ADMISSION 07/14/2020. This is a medically complex 87 year old woman who is sent over urgently from her primary care doctor Dr. Doy Hutching at [facility] and we worked her in this afternoon. She is apparently developed a blister on the dorsal foot sometime over the last 2 weeks on the right which seems to have developed a hemorrhagic component to this. The history here is somewhat vague but this is happened since she left the hospital on 07/02/2020. She also has a blistered area on the left dorsal foot although this does not have a hemorrhagic component to it and it is  not threatened to open where is the right side is definitely leaking fluid and could easily break down. I do not believe she has been doing anything specific to this. With regards to her medical history I have reviewed the discharge summary from the hospital from 06/22/2020 through 07/02/2020. 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 She was given prednisone but was not felt to be a candidate for immunosuppression secondary to her age and comorbidities. She is ANA positive p-ANCA positive. She had a right IJ catheter placed on 9/24 and is being done on dialysis. She is a diabetic. She takes prednisone 20 mg twice daily. She was on aspirin and Plavix although I think these have been put on hold. Past medical history includes; pauci-immune crescentic glomerulonephritis. History of peripheral edema and type 2 diabetes left carotid artery stenosis with a history of a CVA, coronary artery disease and hypertension I cannot see any arterial evaluation in her record in [place] link. We could not do arterial ABIs on her because of complaints of discomfort 10/20; 1 week follow-up. This is a patient who is a diabetic. She recently developed acute renal failure because of pauci-immune glomerulonephritis. Sometime during her initial hospitalization she developed a wound on her right foot tremendous swelling in both legs. She was sent over here urgently last week by her primary care physician with what looks to be hematoma still contained on the right dorsal foot. We put silver alginate on this and put her in compression to control some of the swelling. The left leg had a similar appearance with severe edema in the left dorsal foot so we put that under compression as well. When she arrived back for nurse follow-up on Friday there was some drainage coming out of the wound which our nurse cultured. This is come back showing  Pseudomonas Enterococcus faecalis and staph aureus although the identification of the staph aureus is not complete. I empirically put her on cefdinir 300 mg after dialysis on Tuesday Thursday and Saturday she is only had one dose. She has not been systemically unwell 10/27 1 week follow-up. With considerable help of her nursing staff I was finally able to speak to her nephrologist who manages her at dialysis. Started her on vancomycin and ceftazidime. This to cover the combination of methicillin sensitive staph aureus Enterococcus faecalis and Pseudomonas. Although the staph aureus was methicillin sensitive and the Enterococcus ampicillin sensitive vancomycin provided the best alternative here that can be given at dialysis and ceftazidime to cover the Pseudomonas. She had a traumatic wound on the top of her foot and a complicated abscess. She has significant tissue destruction from this although the wound is looking a lot better today. We are using silver alginate to the surface. X-ray of the foot did not show any deep obvious infection or osteomyelitis 12/1; since the patient was last here she was hospitalized from 11/3 through 11/10 with coag negative staphylococcal bacteremia. I do not know the the exact cause of this was determined. She was seen by vascular and they replaced her permacath on the right side of her neck. Her repeat blood cultures were negative. She is still on IV vancomycin ando Ceftazidime at dialysis. She generally feels systemically well she does not have a fever she is still receiving dialysis but reports she voids up to 8 times a day She has home health changing the dressing I believe they are using silver alginate under 3 layer compression. In addition to the large wound on her right anterior foot that we initially saw her for she has apparently a right lateral leg wound that she suffered during a fall and 2 skin tears on the left anterior lower leg 12/8; patient comes in  looking a lot better this week. One of the 2 skin tears she has on the left leg is healed the other is smaller. The area on the right lateral leg looks improved and her original wound on the right dorsal foot is a lot smaller and looks a lot better. We used Hydrofera Blue to all wound areas under 3 layer compression.. Apparently home health had some trouble with the compression wraps. 12/15 patient comes in with all of her wounds smaller including her original deep punched out wound on the right dorsal foot as well as the skin tears on the right posterior lateral calf and the left anterior lower leg. We have been using Hydrofera Blue under compression excellent results 10/12/2020 upon evaluation today patient appears to be doing decently well currently in regard to her foot ulcer. She does have a small hole still open with some tunneling that really goes proximally in the foot at around the 12:00 location. There is really not significant undermining other locations. This appears to be a small area of where there was a little fluid trapped. Nonetheless I do feel like this is still doing quite well and in general I think that she is going to benefit from possibly packing into this area with endoform in order to keep the area open while it granulates in. 11/09/2020 upon evaluation today patient appears to be doing really about the same in regard to her wound. There is a little reopening towards the distal portion of the wound bed but again I think this is just more as a result of the dressing possibly getting stuck  even to little bit of the scar tissue nonetheless it also anything looks worse although I am not sure the endoform is really doing the job for her. I think we may want to try something a little different here I think packing with a silver alginate dressing may be optimal as compared to what we tried at this point. Is been almost a month since have seen her and we really have not seen dramatic  improvement. 12/07/2020 upon evaluation today patient appears to be doing well with regard to the dorsal surface of her foot. Overall I feel like this looks to be completely healed today and this is excellent news. There is no evidence of active infection which is great news. No fevers, chills, nausea, vomiting, or diarrhea. [name], [name] ([id number]) Electronic Signature(s) Signed: 12/07/2020 10:14:02 AM By: Worthy Keeler PA-C Entered By: Worthy Keeler on 12/07/2020 10:14:01 [name] ([id number]) -------------------------------------------------------------------------------- Physical Exam Details [name] Date of Service: 12/07/2020 9:45 AM Medical Record Number: [id number] Patient Account Number: [id number] Date of Birth/Sex: 24-Feb-1934 (86 y.o. F) Treating RN: Dolan Amen Primary Care Provider: Fulton Reek Other Clinician: Jeanine Luz Referring Provider: Fulton Reek Treating Provider/Extender: Skipper Cliche in Treatment: 92 Constitutional Well-nourished and well-hydrated in no acute distress. Respiratory normal breathing without difficulty. Psychiatric this patient is able to make decisions and demonstrates good insight into disease process. Alert and Oriented x 3. pleasant and cooperative. Notes Upon inspection patient's wound bed actually showed signs of good granulation epithelization. I am extremely pleased with where things stand and I think she is making excellent progress. Overall I think that we are complete as far as healing is concerned I think she is ready for discharge. Electronic Signature(s) Signed: 12/07/2020 10:14:35 AM By: Worthy Keeler PA-C Entered By: Worthy Keeler on 12/07/2020 10:14:35 [name] ([id number]) -------------------------------------------------------------------------------- Physician Orders Details [name] Date of Service: 12/07/2020 9:45 AM Medical  Record Number: [id number] Patient Account Number: [id number] Date of Birth/Sex: [birth year] (86 y.o. F) Treating RN: Dolan Amen Primary Care Provider: Fulton Reek Other Clinician: Jeanine Luz Referring Provider: Fulton Reek Treating Provider/Extender: Skipper Cliche in Treatment: 4 Verbal / Phone Orders: No Diagnosis Coding ICD-10 Coding Code Description S90.31XD Contusion of right foot, subsequent encounter L97.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 with necrosis of muscle L97.811 Non-pressure chronic ulcer of other part of right lower leg limited to breakdown of skin S80.12XD Contusion of left lower leg, subsequent encounter E11.22 Type 2 diabetes mellitus with diabetic chronic kidney disease N18.5 Chronic kidney disease, stage 5 Discharge From [facility] o Discharge from [facility] o Wear compression garments daily. Put garments on first thing when you wake up and remove them before bed. Electronic Signature(s) Signed: 12/08/2020 8:14:32 AM By: Worthy Keeler PA-C Signed: 12/08/2020 9:05:11 AM By: Georges Mouse, Minus Breeding RN Entered By: Georges Mouse, Minus Breeding on 12/07/2020 10:10:09 [name] ([id number]) -------------------------------------------------------------------------------- Problem List Details [name] Date of Service: 12/07/2020 9:45 AM Medical Record Number: [id number] Patient Account Number: [id number] Date of Birth/Sex: April 23, 1934 (86 y.o. F) Treating RN: Dolan Amen Primary Care Provider: Fulton Reek Other Clinician: Jeanine Luz Referring Provider: Fulton Reek Treating Provider/Extender: Skipper Cliche in Treatment: 20 Active Problems ICD-10 Encounter Code Description Active Date MDM Diagnosis S90.31XD Contusion of right foot, subsequent encounter 07/14/2020 No Yes L97.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 with necrosis of  07/21/2020 No Yes muscle L97.811 Non-pressure chronic ulcer of other part of right lower leg limited to 09/01/2020 No Yes breakdown of skin S80.12XD Contusion of left lower leg, subsequent encounter 09/01/2020 No Yes E11.22 Type 2 diabetes mellitus with diabetic chronic kidney disease 07/14/2020 No Yes N18.5 Chronic kidney disease, stage 5 07/14/2020 No Yes Inactive Problems ICD-10 Code Description Active Date Inactive Date S90.822D Blister (nonthermal), left foot, subsequent encounter 07/14/2020 07/14/2020 Resolved Problems Electronic Signature(s) Signed: 12/07/2020 9:50:30 AM By: Worthy Keeler PA-C Entered By: Worthy Keeler on 12/07/2020 09:50:30 [name] ([id number]) -------------------------------------------------------------------------------- Progress Note Details [name] Date of Service: 12/07/2020 9:45 AM Medical Record Number: [id number] Patient Account Number: [id number] Date of Birth/Sex: 29-Jun-1934 (86 y.o. F) Treating RN: Dolan Amen Primary Care Provider: Fulton Reek Other Clinician: Jeanine Luz Referring Provider: Fulton Reek Treating Provider/Extender: Skipper Cliche in Treatment: 20 Subjective Chief Complaint Information obtained from Patient 07/14/2020; patient sent over here for a hemorrhagic blister on the right dorsal foot by her primary doctor Dr. Doy Hutching at [facility]. History of Present Illness (HPI) ADMISSION 07/14/2020. This is a medically complex 87 year old woman who is sent over urgently from her primary care doctor Dr. Doy Hutching at [facility] and we worked her in this afternoon. She is apparently developed a blister on the dorsal foot sometime over the last 2 weeks on the right which seems to have developed a hemorrhagic component to this. The history here is somewhat vague but this is happened since she left the hospital on 07/02/2020. She also has a blistered area on the left dorsal foot  although this does not have a hemorrhagic component to it and it is not threatened to open where is the right side is definitely leaking fluid and could easily break down. I do not believe she has been doing anything specific to this. With regards to her medical history I have reviewed the discharge summary from the hospital from 06/22/2020 through 07/02/2020. 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 She was given prednisone but was not felt to be a candidate for immunosuppression secondary to her age and comorbidities. She is ANA positive p-ANCA positive. She had a right IJ catheter placed on 9/24 and is being done on dialysis. She is a diabetic. She takes prednisone 20 mg twice daily. She was on aspirin and Plavix although I think these have been put on hold. Past medical history includes; pauci-immune crescentic glomerulonephritis. History of peripheral edema and type 2 diabetes left carotid artery stenosis with a history of a CVA, coronary artery disease and hypertension I cannot see any arterial evaluation in her record in [place] link. We could not do arterial ABIs on her because of complaints of discomfort 10/20; 1 week follow-up. This is a patient who is a diabetic. She recently developed acute renal failure because of pauci-immune glomerulonephritis. Sometime during her initial hospitalization she developed a wound on her right foot tremendous swelling in both legs. She was sent over here urgently last week by her primary care physician with what looks to be hematoma still contained on the right dorsal foot. We put silver alginate on this and put her in compression to control some of the swelling. The left leg had a similar appearance with severe edema in the left dorsal foot so we put that under compression as well. When she arrived back for nurse follow-up on Friday there was some drainage  coming out of the wound which our nurse cultured. This is come back showing Pseudomonas Enterococcus faecalis and staph aureus although the identification of the staph aureus is not complete. I empirically put her on cefdinir 300 mg after dialysis on Tuesday Thursday and Saturday she is only had one dose. She has not been systemically unwell 10/27 1 week follow-up. With considerable help of her nursing staff I was finally able to speak to her nephrologist who manages her at dialysis. Started her on vancomycin and ceftazidime. This to cover the combination of methicillin sensitive staph aureus Enterococcus faecalis and Pseudomonas. Although the staph aureus was methicillin sensitive and the Enterococcus ampicillin sensitive vancomycin provided the best alternative here that can be given at dialysis and ceftazidime to cover the Pseudomonas. She had a traumatic wound on the top of her foot and a complicated abscess. She has significant tissue destruction from this although the wound is looking a lot better today. We are using silver alginate to the surface. X-ray of the foot did not show any deep obvious infection or osteomyelitis 12/1; since the patient was last here she was hospitalized from 11/3 through 11/10 with coag negative staphylococcal bacteremia. I do not know the the exact cause of this was determined. She was seen by vascular and they replaced her permacath on the right side of her neck. Her repeat blood cultures were negative. She is still on IV vancomycin ando Ceftazidime at dialysis. She generally feels systemically well she does not have a fever she is still receiving dialysis but reports she voids up to 8 times a day She has home health changing the dressing I believe they are using silver alginate under 3 layer compression. In addition to the large wound on her right anterior foot that we initially saw her for she has apparently a right lateral leg wound that she suffered during a fall  and 2 skin tears on the left anterior lower leg 12/8; patient comes in looking a lot better this week. One of the 2 skin tears she has on the left leg is healed the other is smaller. The area on the right lateral leg looks improved and her original wound on the right dorsal foot is a lot smaller and looks a lot better. We used Hydrofera Blue to all wound areas under 3 layer compression.. Apparently home health had some trouble with the compression wraps. 12/15 patient comes in with all of her wounds smaller including her original deep punched out wound on the right dorsal foot as well as the skin tears on the right posterior lateral calf and the left anterior lower leg. We have been using Hydrofera Blue under compression excellent results 10/12/2020 upon evaluation today patient appears to be doing decently well currently in regard to her foot ulcer. She does have a small hole still open with some tunneling that really goes proximally in the foot at around the 12:00 location. There is really not significant undermining other locations. This appears to be a small area of where there was a little fluid trapped. Nonetheless I do feel like this is still doing quite well and in general I think that she is going to benefit from possibly packing into this area with endoform in order to keep the area open while it granulates in. 11/09/2020 upon evaluation today patient appears to be doing really about the same in regard to her wound. There is a little reopening towards the distal portion of the wound bed but again I  think this is just more as a result of the dressing possibly getting stuck even to little bit of the scar tissue nonetheless it also anything looks worse although I am not sure the endoform is really doing the job for her. I think we may want to try something a little different here I think packing with a silver alginate dressing may be optimal as compared to what we tried at this point. Is  been almost a month since have seen her and we really have not seen dramatic improvement. [name], [name] ([id number]) 12/07/2020 upon evaluation today patient appears to be doing well with regard to the dorsal surface of her foot. Overall I feel like this looks to be completely healed today and this is excellent news. There is no evidence of active infection which is great news. No fevers, chills, nausea, vomiting, or diarrhea. Objective Constitutional Well-nourished and well-hydrated in no acute distress. Vitals Time Taken: 9:55 AM, Height: 64 in, Weight: 205 lbs, BMI: 35.2, Temperature: 98 F, Pulse: 90 bpm, Respiratory Rate: 16 breaths/min, Blood Pressure: 158/62 mmHg. Respiratory normal breathing without difficulty. Psychiatric this patient is able to make decisions and demonstrates good insight into disease process. Alert and Oriented x 3. pleasant and cooperative. General Notes: Upon inspection patient's wound bed actually showed signs of good granulation epithelization. I am extremely pleased with where things stand and I think she is making excellent progress. Overall I think that we are complete as far as healing is concerned I think she is ready for discharge. Integumentary (Hair, Skin) Wound #1 status is Healed - Epithelialized. Original cause of wound was Blister. The date acquired was: 07/02/2020. The wound has been in treatment 20 weeks. The wound is located on the Right,Dorsal Foot. The wound measures 0cm length x 0cm width x 0cm depth; 0cm^2 area and 0cm^3 volume. There is no tunneling or undermining noted. There is a none present amount of drainage noted. The wound margin is flat and intact. There is no granulation within the wound bed. There is no necrotic tissue within the wound bed. Assessment Active Problems ICD-10 Contusion of right foot, subsequent encounter Non-pressure chronic ulcer of other part of right foot with necrosis of muscle Non-pressure chronic ulcer  of other part of right lower leg limited to breakdown of skin Contusion of left lower leg, subsequent encounter Type 2 diabetes mellitus with diabetic chronic kidney disease Chronic kidney disease, stage 5 Plan Discharge From [facility]: Discharge from [facility] Wear compression garments daily. Put garments on first thing when you wake up and remove them before bed. 1. Would recommend currently that we discontinue wound care services as the patient appears to be completely healed. 2. Also can recommend that the patient continue to elevate her legs is much as possible to try to keep edema under control. She tells me home health nurse has ordered her compression stockings. 3. We will give her a Tubigrip today to help with some compression until she gets her stockings. We will see her back for follow-up visit as needed. [name], [name] ([id number]) Electronic Signature(s) Signed: 12/07/2020 10:15:17 AM By: Worthy Keeler PA-C Entered By: Worthy Keeler on 12/07/2020 10:15:17 [name] ([id number]) -------------------------------------------------------------------------------- SuperBill Details [name] Date of Service: 12/07/2020 Medical Record Number: [id number] Patient Account Number: [id number] Date of Birth/Sex: 01/08/1934 (86 y.o. F) Treating RN: Dolan Amen Primary Care Provider: Fulton Reek Other Clinician: Jeanine Luz Referring Provider: Fulton Reek Treating Provider/Extender: Joaquim Lai,  Vance Gather in Treatment: 20 Diagnosis Coding ICD-10 Codes Code Description S90.31XD Contusion of right foot, subsequent encounter L97.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 with necrosis of muscle L97.811 Non-pressure chronic ulcer of other part of right lower leg limited to breakdown of skin S80.12XD Contusion of left lower leg, subsequent encounter E11.22 Type 2 diabetes mellitus with diabetic  chronic kidney disease N18.5 Chronic kidney disease, stage 5 Facility Procedures CPT4 Code: [id number] Description: [phone] - WOUND CARE VISIT-LEV 2 EST PT Modifier: Quantity: 1 Physician Procedures CPT4 Code: [id number] Description: 47096 - WC PHYS LEVEL 3 - EST PT Modifier: Quantity: 1 CPT4 Code: Description: ICD-10 Diagnosis Description S90.31XD Contusion of right foot, subsequent encounter L97.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 with necrosis of mu L97.811 Non-pressure chronic ulcer of other part of right lower leg  limited to bre S80.12XD Contusion of left lower leg, subsequent encounter Modifier: scle akdown of skin Quantity: Electronic Signature(s) Signed: 12/07/2020 10:15:57 AM By: Worthy Keeler PA-C Entered By: Worthy Keeler on 12/07/2020 10:15:56

## 2020-12-07 NOTE — Progress Notes (Addendum)
GRACY, EHLY ([id number]) Visit Report for 12/07/2020 Arrival Information Details Patient Name: LUMMIE, MONTIJO Date of Service: 12/07/2020 9:45 AM Medical Record Number: [id number] Patient Account Number: [id number] Date of Birth/Sex: [birth year] (86 y.o. F) Treating RN: Carlene Coria Primary Care Marigene Erler: Fulton Reek Other Clinician: Jeanine Luz Referring Darcel Zick: Fulton Reek Treating Breeona Waid/Extender: Skipper Cliche in Treatment: 67 Visit Information History Since Last Visit All ordered tests and consults were completed: No Patient Arrived: Wheel Chair Added or deleted any medications: No Arrival Time: 09:47 Any new allergies or adverse reactions: No Accompanied By: husband Had a fall or experienced change in No Transfer Assistance: None activities of daily living that may affect Patient Identification Verified: Yes risk of falls: Secondary Verification Process Completed: Yes Signs or symptoms of abuse/neglect since last visito No Patient Requires Transmission-Based Precautions: No Hospitalized since last visit: No Patient Has Alerts: No Implantable device outside of the clinic excluding No cellular tissue based products placed in the center since last visit: Has Dressing in Place as Prescribed: Yes Has Compression in Place as Prescribed: Yes Pain Present Now: No Electronic Signature(s) Signed: 12/08/2020 12:09:52 PM By: Carlene Coria RN Entered By: Carlene Coria on 12/07/2020 09:55:29 [name] ([id number]) -------------------------------------------------------------------------------- Clinic Level of Care Assessment Details Patient Name: [name] Date of Service: 12/07/2020 9:45 AM Medical Record Number: [id number] Patient Account Number: [id number] Date of Birth/Sex: 04/05/1934 (86 y.o. F) Treating RN: Dolan Amen Primary Care Lalia Loudon: Fulton Reek Other Clinician: Jeanine Luz Referring Jarrius Huaracha: Fulton Reek Treating Lagena Strand/Extender: Skipper Cliche in Treatment: 20 Clinic Level of Care Assessment Items TOOL 4 Quantity Score X - Use when only an EandM is performed on FOLLOW-UP visit 1 0 ASSESSMENTS - Nursing Assessment / Reassessment X - Reassessment of Co-morbidities (includes updates in patient status) 1 10 X- 1 5 Reassessment of Adherence to Treatment Plan ASSESSMENTS - Wound and Skin Assessment / Reassessment X - Simple Wound Assessment / Reassessment - one wound 1 5 []  - 0 Complex Wound Assessment / Reassessment - multiple wounds []  - 0 Dermatologic / Skin Assessment (not related to wound area) ASSESSMENTS - Focused Assessment []  - Circumferential Edema Measurements - multi extremities 0 []  - 0 Nutritional Assessment / Counseling / Intervention []  - 0 Lower Extremity Assessment (monofilament, tuning fork, pulses) []  - 0 Peripheral Arterial Disease Assessment (using hand held doppler) ASSESSMENTS - Ostomy and/or Continence Assessment and Care []  - Incontinence Assessment and Management 0 []  - 0 Ostomy Care Assessment and Management (repouching, etc.) PROCESS - Coordination of Care X - Simple Patient / Family Education for ongoing care 1 15 []  - 0 Complex (extensive) Patient / Family Education for ongoing care []  - 0 Staff obtains Programmer, systems, Records, Test Results / Process Orders []  - 0 Staff telephones HHA, Nursing Homes / Clarify orders / etc []  - 0 Routine Transfer to another Facility (non-emergent condition) []  - 0 Routine Hospital Admission (non-emergent condition) []  - 0 New Admissions / Biomedical engineer / Ordering NPWT, Apligraf, etc. []  - 0 Emergency Hospital Admission (emergent condition) X- 1 10 Simple Discharge Coordination []  - 0 Complex (extensive) Discharge Coordination PROCESS - Special Needs []  - Pediatric / Minor Patient Management 0 []  - 0 Isolation Patient Management []  - 0 Hearing / Language / Visual special needs []  -  0 Assessment of Community assistance (transportation, D/C planning, etc.) []  - 0 Additional assistance / Altered mentation []  - 0 Support Surface(s) Assessment (bed, cushion, seat, etc.) INTERVENTIONS - Wound Cleansing /  Measurement RASHAUNDA, RAHL. ([id number]) X- 1 5 Simple Wound Cleansing - one wound []  - 0 Complex Wound Cleansing - multiple wounds X- 1 5 Wound Imaging (photographs - any number of wounds) []  - 0 Wound Tracing (instead of photographs) X- 1 5 Simple Wound Measurement - one wound []  - 0 Complex Wound Measurement - multiple wounds INTERVENTIONS - Wound Dressings []  - Small Wound Dressing one or multiple wounds 0 []  - 0 Medium Wound Dressing one or multiple wounds []  - 0 Large Wound Dressing one or multiple wounds []  - 0 Application of Medications - topical []  - 0 Application of Medications - injection INTERVENTIONS - Miscellaneous []  - External ear exam 0 []  - 0 Specimen Collection (cultures, biopsies, blood, body fluids, etc.) []  - 0 Specimen(s) / Culture(s) sent or taken to Lab for analysis []  - 0 Patient Transfer (multiple staff / Civil Service fast streamer / Similar devices) []  - 0 Simple Staple / Suture removal (25 or less) []  - 0 Complex Staple / Suture removal (26 or more) []  - 0 Hypo / Hyperglycemic Management (close monitor of Blood Glucose) []  - 0 Ankle / Brachial Index (ABI) - do not check if billed separately X- 1 5 Vital Signs Has the patient been seen at the hospital within the last three years: Yes Total Score: 65 Level Of Care: New/Established - Level 2 Electronic Signature(s) Signed: 12/08/2020 9:05:11 AM By: Georges Mouse, Minus Breeding RN Entered By: Georges Mouse, Minus Breeding on 12/07/2020 10:08:04 [name] ([id number]) -------------------------------------------------------------------------------- Encounter Discharge Information Details Patient Name: [name] Date of Service: 12/07/2020 9:45 AM Medical Record Number:  [id number] Patient Account Number: [id number] Date of Birth/Sex: 13-Dec-1933 (86 y.o. F) Treating RN: Dolan Amen Primary Care Nayomi Tabron: Fulton Reek Other Clinician: Jeanine Luz Referring Kimani Bedoya: Fulton Reek Treating Parrie Rasco/Extender: Skipper Cliche in Treatment: 20 Encounter Discharge Information Items Discharge Condition: Stable Ambulatory Status: Wheelchair Discharge Destination: Home Transportation: Private Auto Accompanied By: husband Schedule Follow-up Appointment: Yes Clinical Summary of Care: Electronic Signature(s) Signed: 12/08/2020 9:05:11 AM By: Georges Mouse, Minus Breeding RN Entered By: Georges Mouse, Minus Breeding on 12/07/2020 10:09:50 [name] ([id number]) -------------------------------------------------------------------------------- Lower Extremity Assessment Details Patient Name: [name] Date of Service: 12/07/2020 9:45 AM Medical Record Number: [id number] Patient Account Number: [id number] Date of Birth/Sex: 1934-03-17 (86 y.o. F) Treating RN: Carlene Coria Primary Care Deztinee Lohmeyer: Fulton Reek Other Clinician: Jeanine Luz Referring Kasiyah Platter: Fulton Reek Treating Moshe Wenger/Extender: Skipper Cliche in Treatment: 20 Edema Assessment Assessed: [Left: No] [Right: No] Edema: [Left: Ye] [Right: s] Calf Left: Right: Point of Measurement: 31 cm From Medial Instep 37 cm Ankle Left: Right: Point of Measurement: 11 cm From Medial Instep 22 cm Electronic Signature(s) Signed: 12/08/2020 12:09:52 PM By: Carlene Coria RN Entered By: Carlene Coria on 12/07/2020 09:57:22 [name] ([id number]) -------------------------------------------------------------------------------- Multi Wound Chart Details Patient Name: [name] Date of Service: 12/07/2020 9:45 AM Medical Record Number: [id number] Patient Account Number: [id number] Date of Birth/Sex: [birth year] (86 y.o. F) Treating RN: Dolan Amen Primary Care Shilee Biggs:  Fulton Reek Other Clinician: Jeanine Luz Referring Aleina Burgio: Fulton Reek Treating Daxten Kovalenko/Extender: Skipper Cliche in Treatment: 20 Vital Signs Height(in): 64 Pulse(bpm): 90 Weight(lbs): 205 Blood Pressure(mmHg): 158/62 Body Mass Index(BMI): 35 Temperature(F): 98 Respiratory Rate(breaths/min): 16 Photos: [N/A:N/A] Wound Location: Right, Dorsal Foot N/A N/A Wounding Event: Blister N/A N/A Primary Etiology: Diabetic Wound/Ulcer of the Lower N/A N/A Extremity Comorbid History: Congestive Heart Failure, N/A N/A Hypertension, Type II Diabetes, Received Radiation Date Acquired: 07/02/2020 N/A N/A Weeks of  Treatment: 20 N/A N/A Wound Status: Healed - Epithelialized N/A N/A Measurements L x W x D (cm) 0x0x0 N/A N/A Area (cm) : 0 N/A N/A Volume (cm) : 0 N/A N/A % Reduction in Area: 100.00% N/A N/A % Reduction in Volume: 100.00% N/A N/A Classification: Grade 2 N/A N/A Exudate Amount: None Present N/A N/A Wound Margin: Flat and Intact N/A N/A Granulation Amount: None Present (0%) N/A N/A Necrotic Amount: None Present (0%) N/A N/A Exposed Structures: Fascia: No N/A N/A Fat Layer (Subcutaneous Tissue): No Tendon: No Muscle: No Joint: No Bone: No Epithelialization: Large (67-100%) N/A N/A Treatment Notes Electronic Signature(s) Signed: 12/08/2020 9:05:11 AM By: Georges Mouse, Minus Breeding RN Entered By: Georges Mouse, Minus Breeding on 12/07/2020 10:07:12 [name] ([id number]) -------------------------------------------------------------------------------- [place] Details Patient Name: [name] Date of Service: 12/07/2020 9:45 AM Medical Record Number: [id number] Patient Account Number: [id number] Date of Birth/Sex: 15-Jul-1934 (86 y.o. F) Treating RN: Dolan Amen Primary Care Cecille Mcclusky: Fulton Reek Other Clinician: Jeanine Luz Referring Antonietta Lansdowne: Fulton Reek Treating Tjuana Vickrey/Extender: Skipper Cliche in  Treatment: 20 Active Inactive Electronic Signature(s) Signed: 12/07/2020 2:21:41 PM By: Georges Mouse, Minus Breeding RN Entered By: Georges Mouse, Minus Breeding on 12/07/2020 14:21:40 [name] ([id number]) -------------------------------------------------------------------------------- Pain Assessment Details Patient Name: [name] Date of Service: 12/07/2020 9:45 AM Medical Record Number: [id number] Patient Account Number: [id number] Date of Birth/Sex: [birth year] (86 y.o. F) Treating RN: Carlene Coria Primary Care Quintell Bonnin: Fulton Reek Other Clinician: Jeanine Luz Referring Verleen Stuckey: Fulton Reek Treating Dorathea Faerber/Extender: Skipper Cliche in Treatment: 20 Active Problems Location of Pain Severity and Description of Pain Patient Has Paino No Site Locations Pain Management and Medication Current Pain Management: Electronic Signature(s) Signed: 12/08/2020 12:09:52 PM By: Carlene Coria RN Entered By: Carlene Coria on 12/07/2020 09:55:55 [name] ([id number]) -------------------------------------------------------------------------------- Patient/Caregiver Education Details Patient Name: [name] Date of Service: 12/07/2020 9:45 AM Medical Record Number: [id number] Patient Account Number: [id number] Date of Birth/Gender: 04/15/1934 (86 y.o. F) Treating RN: Dolan Amen Primary Care Physician: Fulton Reek Other Clinician: Jeanine Luz Referring Physician: Fulton Reek Treating Physician/Extender: Skipper Cliche in Treatment: 20 Education Assessment Education Provided To: Patient Education Topics Provided Notes educated on importance of compression Electronic Signature(s) Signed: 12/08/2020 9:05:11 AM By: Georges Mouse, Minus Breeding RN Entered By: Georges Mouse, Minus Breeding on 12/07/2020 10:09:08 [name] ([id number]) -------------------------------------------------------------------------------- Wound Assessment  Details Patient Name: [name] Date of Service: 12/07/2020 9:45 AM Medical Record Number: [id number] Patient Account Number: [id number] Date of Birth/Sex: [birth year] (86 y.o. F) Treating RN: Carlene Coria Primary Care Calin Ellery: Fulton Reek Other Clinician: Jeanine Luz Referring Annina Piotrowski: Fulton Reek Treating Mariene Dickerman/Extender: Skipper Cliche in Treatment: 20 Wound Status Wound Number: 1 Primary Diabetic Wound/Ulcer of the Lower Extremity Etiology: Wound Location: Right, Dorsal Foot Wound Status: Healed - Epithelialized Wounding Event: Blister Comorbid Congestive Heart Failure, Hypertension, Type II Date Acquired: 07/02/2020 History: Diabetes, Received Radiation Weeks Of Treatment: 20 Clustered Wound: No Photos Wound Measurements Length: (cm) 0 Width: (cm) 0 Depth: (cm) 0 Area: (cm) 0 Volume: (cm) 0 % Reduction in Area: 100% % Reduction in Volume: 100% Epithelialization: Large (67-100%) Tunneling: No Undermining: No Wound Description Classification: Grade 2 Wound Margin: Flat and Intact Exudate Amount: None Present Foul Odor After Cleansing: No Slough/Fibrino No Wound Bed Granulation Amount: None Present (0%) Exposed Structure Necrotic Amount: None Present (0%) Fascia Exposed: No Fat Layer (Subcutaneous Tissue) Exposed: No Tendon Exposed: No Muscle Exposed: No Joint Exposed: No Bone Exposed: No Electronic Signature(s) Signed: 12/08/2020  9:05:11 AM By: Georges Mouse, Minus Breeding RN Signed: 12/08/2020 12:09:52 PM By: Carlene Coria RN Entered By: Georges Mouse, Minus Breeding on 12/07/2020 10:06:44 [name] ([id number]) -------------------------------------------------------------------------------- Vitals Details Patient Name: [name] Date of Service: 12/07/2020 9:45 AM Medical Record Number: [id number] Patient Account Number: [id number] Date of Birth/Sex: December 05, 1933 (86 y.o. F) Treating RN: Carlene Coria Primary Care Rosezella Kronick: Fulton Reek Other Clinician: Jeanine Luz Referring Antonia Jicha: Fulton Reek Treating Genecis Veley/Extender: Skipper Cliche in Treatment: 20 Vital Signs Time Taken: 09:55 Temperature (F): 98 Height (in): 64 Pulse (bpm): 90 Weight (lbs): 205 Respiratory Rate (breaths/min): 16 Body Mass Index (BMI): 35.2 Blood Pressure (mmHg): 158/62 Reference Range: 80 - 120 mg / dl Electronic Signature(s) Signed: 12/08/2020 12:09:52 PM By: Carlene Coria RN Entered By: Carlene Coria on 12/07/2020 09:55:48

## 2020-12-23 ENCOUNTER — Ambulatory Visit (INDEPENDENT_AMBULATORY_CARE_PROVIDER_SITE_OTHER): Payer: Medicare Other

## 2020-12-23 ENCOUNTER — Other Ambulatory Visit: Payer: Self-pay

## 2020-12-23 ENCOUNTER — Ambulatory Visit (INDEPENDENT_AMBULATORY_CARE_PROVIDER_SITE_OTHER): Payer: Medicare Other | Admitting: Nurse Practitioner

## 2020-12-23 ENCOUNTER — Other Ambulatory Visit (INDEPENDENT_AMBULATORY_CARE_PROVIDER_SITE_OTHER): Payer: Self-pay | Admitting: Vascular Surgery

## 2020-12-23 ENCOUNTER — Encounter (INDEPENDENT_AMBULATORY_CARE_PROVIDER_SITE_OTHER): Payer: Self-pay | Admitting: Nurse Practitioner

## 2020-12-23 VITALS — BP 172/82 | HR 92 | Ht 64.0 in | Wt 186.0 lb

## 2020-12-23 DIAGNOSIS — N186 End stage renal disease: Secondary | ICD-10-CM

## 2020-12-23 DIAGNOSIS — E1159 Type 2 diabetes mellitus with other circulatory complications: Secondary | ICD-10-CM

## 2020-12-23 DIAGNOSIS — I6523 Occlusion and stenosis of bilateral carotid arteries: Secondary | ICD-10-CM

## 2020-12-23 DIAGNOSIS — Z9889 Other specified postprocedural states: Secondary | ICD-10-CM

## 2020-12-23 DIAGNOSIS — I10 Essential (primary) hypertension: Secondary | ICD-10-CM

## 2020-12-28 ENCOUNTER — Encounter (INDEPENDENT_AMBULATORY_CARE_PROVIDER_SITE_OTHER): Payer: Self-pay | Admitting: Nurse Practitioner

## 2020-12-28 NOTE — H&P (View-Only) (Signed)
Subjective:    Patient ID: [name], female    DOB: [birth year], 87 y.o.   MRN: [id number] Chief Complaint  Patient presents with  . Follow-up    6 week [facility] post AV fistula U/S    [name] is an 87 year old female that presents today for follow-up after a left brachiocephalic AV fistula placement on 11/04/2020.  The fistula is not readily palpable.  There is a good thrill and bruit present however.  Patient denies any episodes of hypotension fever or chills.  The patient is currently maintained via PermCath.  Denies any fever or chills.  She denies any issues with her current PermCath.  Today the patient has a flow volume of 503.  There is a significant stenosis at the anastomosis site with a low flow volume seen.   Review of Systems  Skin: Negative for wound.  All other systems reviewed and are negative.      Objective:   Physical Exam Vitals reviewed.  HENT:     Head: Normocephalic.  Cardiovascular:     Rate and Rhythm: Normal rate.     Pulses: Normal pulses.  Pulmonary:     Effort: Pulmonary effort is normal.  Skin:    General: Skin is warm and dry.  Neurological:     Mental Status: She is alert and oriented to person, place, and time.     Motor: Weakness present.  Psychiatric:        Mood and Affect: Mood normal.        Behavior: Behavior normal.        Thought Content: Thought content normal.        Judgment: Judgment normal.     BP (!) 172/82   Pulse 92   Ht 5\' 4"  (1.626 m)   Wt 186 lb (84.4 kg)   BMI 31.93 kg/m   Past Medical History:  Diagnosis Date  . Arthritis   . Bilateral Macular degeneration   . Breast cancer ([place]) 2012   right breast  . Breast mass, right   . CAD S/P CABG x 4    a. 09/2012 CABG x 4: LIMA to LAD, SVG to Diag, SVG to OM1, SVG to PDA, EVH from bilateral thighs  . Cancer [facility])    breast cancer, right side 2012  . Complication of anesthesia   . Coronary artery disease 08/22/2012   sees Dr Rockey Situ  . Diabetes  ([place])   . Diastolic dysfunction    a. 06/2020 Echo: EF 60-65%; b. 08/2020 Echo: EF 60-65%, no rwma, Gr1 DD. Nl RV size/fxn.  . DOE (dyspnea on exertion)   . ESRD (end stage renal disease) ([place])   . Gastritis    hx of  . GERD (gastroesophageal reflux disease)   . Headache(784.0)    migraines  . History of breast cancer    39 treatments of radiation. Negative chemo.  Marland Kitchen History of seasonal allergies   . Hyperlipemia   . Hypertension    sees Dr. Fulton Reek  . Hypothyroidism   . Neuropathy   . Personal history of radiation therapy   . Pneumonia    hx of  . PONV (postoperative nausea and vomiting)   . Stroke ([place])    2012  . Vertigo     Social History   Socioeconomic History  . Marital status: Married    Spouse name: [name] "Bud"  . Number of children: 0  . Years of education: Not on file  . Highest education  level: Not on file  Occupational History  . Occupation: retired  Tobacco Use  . Smoking status: Former Smoker    Types: Cigarettes    Quit date: 11/13/1988    Years since quitting: 32.1  . Smokeless tobacco: Never Used  Vaping Use  . Vaping Use: Never used  Substance and Sexual Activity  . Alcohol use: No  . Drug use: No  . Sexual activity: Not on file  Other Topics Concern  . Not on file  Social History Narrative   Lives at home with spouse    Social Determinants of Health   Financial Resource Strain: Not on file  Food Insecurity: Not on file  Transportation Needs: Not on file  Physical Activity: Not on file  Stress: Not on file  Social Connections: Not on file  Intimate Partner Violence: Not on file    Past Surgical History:  Procedure Laterality Date  . ABDOMINAL HYSTERECTOMY    . APPENDECTOMY    . AV FISTULA PLACEMENT Left 11/04/2020   Procedure: ARTERIOVENOUS (AV) FISTULA CREATION (BRACHIOCEPHALIC);  Surgeon: Algernon Huxley, MD;  Location: [facility];  Service: Vascular;  Laterality: Left;  . BACK SURGERY    . BREAST BIOPSY Right    2012  positive- [facility]  . BREAST LUMPECTOMY Right 2012   F/U radiation   . BREAST SURGERY    . CARDIAC CATHETERIZATION  2014  . CAROTID PTA/STENT INTERVENTION Left 06/14/2020   Procedure: CAROTID PTA/STENT INTERVENTION;  Surgeon: Algernon Huxley, MD;  Location: [place] [facility];  Service: Cardiovascular;  Laterality: Left;  . CATARACT EXTRACTION W/PHACO Right 08/14/2017   Procedure: CATARACT EXTRACTION PHACO AND INTRAOCULAR LENS PLACEMENT (IOC);  Surgeon: Birder Robson, MD;  Location: [facility];  Service: Ophthalmology;  Laterality: Right;  [place] 00:43.0 AP% 17.1 CDE 7.37 Fluid Pack lot # [id number]  . CATARACT EXTRACTION W/PHACO Left 10/09/2017   Procedure: CATARACT EXTRACTION PHACO AND INTRAOCULAR LENS PLACEMENT ([place]);  Surgeon: Birder Robson, MD;  Location: [facility];  Service: Ophthalmology;  Laterality: Left;  [place] 00:30.3 AP% 11.0 CDE 3.33 Fluid Pack Lot # [id number]  . CORONARY ARTERY BYPASS GRAFT  09/05/2012   Procedure: CORONARY ARTERY BYPASS GRAFTING (CABG);  Surgeon: Rexene Alberts, MD;  Location: [place];  Service: Open Heart Surgery;  Laterality: N/A;  CABG x four, using left internal mammary artery and bilateral greater saphenous vein harvested endoscopically  . DIALYSIS/PERMA CATHETER INSERTION Left 06/25/2020   Procedure: DIALYSIS/PERMA CATHETER INSERTION;  Surgeon: Algernon Huxley, MD;  Location: [place] [facility];  Service: Cardiovascular;  Laterality: Left;  . DIALYSIS/PERMA CATHETER INSERTION N/A 08/10/2020   Procedure: DIALYSIS/PERMA CATHETER INSERTION;  Surgeon: Katha Cabal, MD;  Location: [place] [facility];  Service: Cardiovascular;  Laterality: N/A;  . DIALYSIS/PERMA CATHETER REMOVAL N/A 08/05/2020   Procedure: DIALYSIS/PERMA CATHETER REMOVAL;  Surgeon: Algernon Huxley, MD;  Location: [place] [facility];  Service: Cardiovascular;  Laterality: N/A;  . INTRAOPERATIVE TRANSESOPHAGEAL ECHOCARDIOGRAM  09/05/2012   Procedure: INTRAOPERATIVE TRANSESOPHAGEAL ECHOCARDIOGRAM;  Surgeon:  Rexene Alberts, MD;  Location: [place];  Service: Open Heart Surgery;  Laterality: N/A;  . KNEE SURGERY     Bilateral nerve block  . LAPAROSCOPIC NISSEN FUNDOPLICATION    . OVARIAN CYST REMOVAL      Family History  Problem Relation Age of Onset  . Heart disease Brother        CABG & stents  . Hyperlipidemia Brother   . Hypertension Brother   . Cancer Father   .  Heart disease Mother   . Breast cancer Cousin   . Kidney disease Neg Hx     Allergies  Allergen Reactions  . Clonidine Other (See Comments) and Shortness Of Breath    Other reaction(s): Other (see comments) Other reaction(s): Other (See Comments)  . Darvocet [Propoxyphene N-Acetaminophen] Anaphylaxis  . Hydralazine     Other reaction(s): Other (See Comments) glomerulonephritis  . Hydralazine Hcl Other (See Comments)    glomerulonephritis  . Esomeprazole     Other reaction(s): Unknown  . Guaifenesin     Other reaction(s): Unknown Other reaction(s): Unknown Other reaction(s): Unknown Other reaction(s): Unknown  . Phenylephrine-Guaifenesin     Other reaction(s): Unknown  . Rabeprazole     Other reaction(s): Unknown  . Telithromycin     Other reaction(s): Unknown Other reaction(s): Unknown Other reaction(s): Unknown  . Fluocinolone Other (See Comments)    Feels crazy Other reaction(s): Other (see comments) Other reaction(s): Other (See Comments) Feels crazy Feels crazy  . Iodinated Diagnostic Agents Nausea And Vomiting and Other (See Comments)    Burning Other reaction(s): Other (see comments) Other reaction(s): Other (See Comments) Burning Nausea & vomiting  . Levaquin [Levofloxacin] Nausea Only  . Statins Other (See Comments)    Myalgia Other reaction(s): Other (See Comments), Unknown, Unknown Myalgia    CBC Latest Ref Rng & Units 11/04/2020 08/07/2020 08/06/2020  WBC 4.0 - 10.5 K/uL - 4.7 -  Hemoglobin 12.0 - 15.0 g/dL 13.9 7.4(L) 7.3(L)  Hematocrit 36.0 - 46.0 % 41.0 23.9(L) -  Platelets 150 - 400  K/uL - 92(L) -      CMP     Component Value Date/Time   NA 137 11/04/2020 1208   NA 140 11/22/2015 0830   NA 142 05/07/2014 0516   K 5.5 (H) 11/04/2020 1208   K 3.2 (L) 05/07/2014 0516   CL 103 11/04/2020 1208   CL 104 05/07/2014 0516   CO2 27 08/10/2020 0418   CO2 29 05/07/2014 0516   GLUCOSE 68 (L) 11/04/2020 1208   GLUCOSE 64 (L) 05/07/2014 0516   BUN 36 (H) 11/04/2020 1208   BUN 35 (H) 11/22/2015 0830   BUN 26 (H) 05/07/2014 0516   CREATININE 4.90 (H) 11/04/2020 1208   CREATININE 1.13 05/07/2014 0516   CALCIUM 6.9 (L) 08/10/2020 0418   CALCIUM 7.9 (L) 05/07/2014 0516   PROT 6.5 08/03/2020 1557   PROT 8.0 05/03/2014 0451   ALBUMIN 2.2 (L) 08/05/2020 0817   ALBUMIN 2.9 (L) 05/03/2014 0451   AST 12 (L) 08/03/2020 1557   AST 25 05/03/2014 0451   ALT 7 08/03/2020 1557   ALT 19 05/03/2014 0451   ALKPHOS 68 08/03/2020 1557   ALKPHOS 109 05/03/2014 0451   BILITOT 0.8 08/03/2020 1557   BILITOT 0.3 05/03/2014 0451   GFRNONAA 6 (L) 08/10/2020 0418   GFRNONAA 47 (L) 05/07/2014 0516   GFRAA 6 (L) 06/30/2020 0611   GFRAA 54 (L) 05/07/2014 0516     No results found.     Assessment & Plan:   1. ESRD (end stage renal disease) ([place]) Recommend:  The patient is experiencing increasing problems with their dialysis access.  Patient should have a fistulagram with the intention for intervention.  The intention for intervention is to restore appropriate flow and prevent thrombosis and possible loss of the access.  As well as improve the quality of dialysis therapy.  The risks, benefits and alternative therapies were reviewed in detail with the patient.  All questions were answered.  The  patient agrees to proceed with angio/intervention.      2. HTN, goal below 140/80 Continue antihypertensive medications as already ordered, these medications have been reviewed and there are no changes at this time.   3. Type 2 diabetes mellitus with other circulatory complication, without  long-term current use of insulin (HCC) Continue hypoglycemic medications as already ordered, these medications have been reviewed and there are no changes at this time.  Hgb A1C to be monitored as already arranged by primary service    Current Outpatient Medications on File Prior to Visit  Medication Sig Dispense Refill  . ALPRAZolam (XANAX) 0.5 MG tablet Take 0.5 mg by mouth 2 (two) times daily as needed for anxiety.     Marland Kitchen amoxicillin (AMOXIL) 875 MG tablet Take 875 mg by mouth 2 (two) times daily.    Marland Kitchen aspirin 81 MG chewable tablet Chew 81 mg by mouth every Monday, Wednesday, and Friday.     . calcium-vitamin D (OSCAL WITH D) 500-200 MG-UNIT tablet Take 1 tablet by mouth daily.    . cetirizine (ZYRTEC) 10 MG tablet Take 10 mg by mouth daily.    . clopidogrel (PLAVIX) 75 MG tablet Take 75 mg by mouth daily.    . Cyanocobalamin 1000 MCG SUBL Take 1,000 mcg by mouth daily.     Marland Kitchen gabapentin (NEURONTIN) 100 MG capsule Take 100 mg by mouth 2 (two) times daily.    Marland Kitchen glimepiride (AMARYL) 4 MG tablet Take 4 mg by mouth daily with breakfast.    . hydrALAZINE (APRESOLINE) 25 MG tablet Take 25 mg by mouth 3 (three) times daily.    Marland Kitchen HYDROcodone-acetaminophen (NORCO/VICODIN) 5-325 MG tablet Take 1 tablet by mouth every 6 (six) hours as needed for moderate pain. 30 tablet 0  . levothyroxine (SYNTHROID, LEVOTHROID) 150 MCG tablet Take 150 mcg daily before breakfast by mouth.    . metoprolol tartrate (LOPRESSOR) 50 MG tablet Take 50 mg by mouth in the morning, at noon, and at bedtime.    . ondansetron (ZOFRAN) 4 MG tablet Take 4 mg by mouth every 8 (eight) hours as needed.    . torsemide (DEMADEX) 100 MG tablet Take 1 tablet (100 mg total) by mouth daily. 30 tablet 0   No current facility-administered medications on file prior to visit.    There are no Patient Instructions on file for this visit. No follow-ups on file.   Kris Hartmann, NP

## 2020-12-28 NOTE — Progress Notes (Signed)
Subjective:    Patient ID: [name], female    DOB: 1933-10-21, 87 y.o.   MRN: [id number] Chief Complaint  Patient presents with  . Follow-up    6 week [facility] post AV fistula U/S    [name] is an 87 year old female that presents today for follow-up after a left brachiocephalic AV fistula placement on 11/04/2020.  The fistula is not readily palpable.  There is a good thrill and bruit present however.  Patient denies any episodes of hypotension fever or chills.  The patient is currently maintained via PermCath.  Denies any fever or chills.  She denies any issues with her current PermCath.  Today the patient has a flow volume of 503.  There is a significant stenosis at the anastomosis site with a low flow volume seen.   Review of Systems  Skin: Negative for wound.  All other systems reviewed and are negative.      Objective:   Physical Exam Vitals reviewed.  HENT:     Head: Normocephalic.  Cardiovascular:     Rate and Rhythm: Normal rate.     Pulses: Normal pulses.  Pulmonary:     Effort: Pulmonary effort is normal.  Skin:    General: Skin is warm and dry.  Neurological:     Mental Status: She is alert and oriented to person, place, and time.     Motor: Weakness present.  Psychiatric:        Mood and Affect: Mood normal.        Behavior: Behavior normal.        Thought Content: Thought content normal.        Judgment: Judgment normal.     BP (!) 172/82   Pulse 92   Ht 5\' 4"  (1.626 m)   Wt 186 lb (84.4 kg)   BMI 31.93 kg/m   Past Medical History:  Diagnosis Date  . Arthritis   . Bilateral Macular degeneration   . Breast cancer ([place]) 2012   right breast  . Breast mass, right   . CAD S/P CABG x 4    a. 09/2012 CABG x 4: LIMA to LAD, SVG to Diag, SVG to OM1, SVG to PDA, EVH from bilateral thighs  . Cancer [facility])    breast cancer, right side 2012  . Complication of anesthesia   . Coronary artery disease 08/22/2012   sees Dr Rockey Situ  . Diabetes  ([place])   . Diastolic dysfunction    a. 06/2020 Echo: EF 60-65%; b. 08/2020 Echo: EF 60-65%, no rwma, Gr1 DD. Nl RV size/fxn.  . DOE (dyspnea on exertion)   . ESRD (end stage renal disease) ([place])   . Gastritis    hx of  . GERD (gastroesophageal reflux disease)   . Headache(784.0)    migraines  . History of breast cancer    39 treatments of radiation. Negative chemo.  Marland Kitchen History of seasonal allergies   . Hyperlipemia   . Hypertension    sees Dr. Fulton Reek  . Hypothyroidism   . Neuropathy   . Personal history of radiation therapy   . Pneumonia    hx of  . PONV (postoperative nausea and vomiting)   . Stroke ([place])    2012  . Vertigo     Social History   Socioeconomic History  . Marital status: Married    Spouse name: [name] "Bud"  . Number of children: 0  . Years of education: Not on file  . Highest education  level: Not on file  Occupational History  . Occupation: retired  Tobacco Use  . Smoking status: Former Smoker    Types: Cigarettes    Quit date: 11/13/1988    Years since quitting: 32.1  . Smokeless tobacco: Never Used  Vaping Use  . Vaping Use: Never used  Substance and Sexual Activity  . Alcohol use: No  . Drug use: No  . Sexual activity: Not on file  Other Topics Concern  . Not on file  Social History Narrative   Lives at home with spouse    Social Determinants of Health   Financial Resource Strain: Not on file  Food Insecurity: Not on file  Transportation Needs: Not on file  Physical Activity: Not on file  Stress: Not on file  Social Connections: Not on file  Intimate Partner Violence: Not on file    Past Surgical History:  Procedure Laterality Date  . ABDOMINAL HYSTERECTOMY    . APPENDECTOMY    . AV FISTULA PLACEMENT Left 11/04/2020   Procedure: ARTERIOVENOUS (AV) FISTULA CREATION (BRACHIOCEPHALIC);  Surgeon: Algernon Huxley, MD;  Location: [facility];  Service: Vascular;  Laterality: Left;  . BACK SURGERY    . BREAST BIOPSY Right    2012  positive- [facility]  . BREAST LUMPECTOMY Right 2012   F/U radiation   . BREAST SURGERY    . CARDIAC CATHETERIZATION  2014  . CAROTID PTA/STENT INTERVENTION Left 06/14/2020   Procedure: CAROTID PTA/STENT INTERVENTION;  Surgeon: Algernon Huxley, MD;  Location: [place] [facility];  Service: Cardiovascular;  Laterality: Left;  . CATARACT EXTRACTION W/PHACO Right 08/14/2017   Procedure: CATARACT EXTRACTION PHACO AND INTRAOCULAR LENS PLACEMENT (IOC);  Surgeon: Birder Robson, MD;  Location: [facility];  Service: Ophthalmology;  Laterality: Right;  [place] 00:43.0 AP% 17.1 CDE 7.37 Fluid Pack lot # [id number]  . CATARACT EXTRACTION W/PHACO Left 10/09/2017   Procedure: CATARACT EXTRACTION PHACO AND INTRAOCULAR LENS PLACEMENT ([place]);  Surgeon: Birder Robson, MD;  Location: [facility];  Service: Ophthalmology;  Laterality: Left;  [place] 00:30.3 AP% 11.0 CDE 3.33 Fluid Pack Lot # [id number]  . CORONARY ARTERY BYPASS GRAFT  09/05/2012   Procedure: CORONARY ARTERY BYPASS GRAFTING (CABG);  Surgeon: Rexene Alberts, MD;  Location: [place];  Service: Open Heart Surgery;  Laterality: N/A;  CABG x four, using left internal mammary artery and bilateral greater saphenous vein harvested endoscopically  . DIALYSIS/PERMA CATHETER INSERTION Left 06/25/2020   Procedure: DIALYSIS/PERMA CATHETER INSERTION;  Surgeon: Algernon Huxley, MD;  Location: [place] [facility];  Service: Cardiovascular;  Laterality: Left;  . DIALYSIS/PERMA CATHETER INSERTION N/A 08/10/2020   Procedure: DIALYSIS/PERMA CATHETER INSERTION;  Surgeon: Katha Cabal, MD;  Location: [place] [facility];  Service: Cardiovascular;  Laterality: N/A;  . DIALYSIS/PERMA CATHETER REMOVAL N/A 08/05/2020   Procedure: DIALYSIS/PERMA CATHETER REMOVAL;  Surgeon: Algernon Huxley, MD;  Location: [place] [facility];  Service: Cardiovascular;  Laterality: N/A;  . INTRAOPERATIVE TRANSESOPHAGEAL ECHOCARDIOGRAM  09/05/2012   Procedure: INTRAOPERATIVE TRANSESOPHAGEAL ECHOCARDIOGRAM;  Surgeon:  Rexene Alberts, MD;  Location: [place];  Service: Open Heart Surgery;  Laterality: N/A;  . KNEE SURGERY     Bilateral nerve block  . LAPAROSCOPIC NISSEN FUNDOPLICATION    . OVARIAN CYST REMOVAL      Family History  Problem Relation Age of Onset  . Heart disease Brother        CABG & stents  . Hyperlipidemia Brother   . Hypertension Brother   . Cancer Father   .  Heart disease Mother   . Breast cancer Cousin   . Kidney disease Neg Hx     Allergies  Allergen Reactions  . Clonidine Other (See Comments) and Shortness Of Breath    Other reaction(s): Other (see comments) Other reaction(s): Other (See Comments)  . Darvocet [Propoxyphene N-Acetaminophen] Anaphylaxis  . Hydralazine     Other reaction(s): Other (See Comments) glomerulonephritis  . Hydralazine Hcl Other (See Comments)    glomerulonephritis  . Esomeprazole     Other reaction(s): Unknown  . Guaifenesin     Other reaction(s): Unknown Other reaction(s): Unknown Other reaction(s): Unknown Other reaction(s): Unknown  . Phenylephrine-Guaifenesin     Other reaction(s): Unknown  . Rabeprazole     Other reaction(s): Unknown  . Telithromycin     Other reaction(s): Unknown Other reaction(s): Unknown Other reaction(s): Unknown  . Fluocinolone Other (See Comments)    Feels crazy Other reaction(s): Other (see comments) Other reaction(s): Other (See Comments) Feels crazy Feels crazy  . Iodinated Diagnostic Agents Nausea And Vomiting and Other (See Comments)    Burning Other reaction(s): Other (see comments) Other reaction(s): Other (See Comments) Burning Nausea & vomiting  . Levaquin [Levofloxacin] Nausea Only  . Statins Other (See Comments)    Myalgia Other reaction(s): Other (See Comments), Unknown, Unknown Myalgia    CBC Latest Ref Rng & Units 11/04/2020 08/07/2020 08/06/2020  WBC 4.0 - 10.5 K/uL - 4.7 -  Hemoglobin 12.0 - 15.0 g/dL 13.9 7.4(L) 7.3(L)  Hematocrit 36.0 - 46.0 % 41.0 23.9(L) -  Platelets 150 - 400  K/uL - 92(L) -      CMP     Component Value Date/Time   NA 137 11/04/2020 1208   NA 140 11/22/2015 0830   NA 142 05/07/2014 0516   K 5.5 (H) 11/04/2020 1208   K 3.2 (L) 05/07/2014 0516   CL 103 11/04/2020 1208   CL 104 05/07/2014 0516   CO2 27 08/10/2020 0418   CO2 29 05/07/2014 0516   GLUCOSE 68 (L) 11/04/2020 1208   GLUCOSE 64 (L) 05/07/2014 0516   BUN 36 (H) 11/04/2020 1208   BUN 35 (H) 11/22/2015 0830   BUN 26 (H) 05/07/2014 0516   CREATININE 4.90 (H) 11/04/2020 1208   CREATININE 1.13 05/07/2014 0516   CALCIUM 6.9 (L) 08/10/2020 0418   CALCIUM 7.9 (L) 05/07/2014 0516   PROT 6.5 08/03/2020 1557   PROT 8.0 05/03/2014 0451   ALBUMIN 2.2 (L) 08/05/2020 0817   ALBUMIN 2.9 (L) 05/03/2014 0451   AST 12 (L) 08/03/2020 1557   AST 25 05/03/2014 0451   ALT 7 08/03/2020 1557   ALT 19 05/03/2014 0451   ALKPHOS 68 08/03/2020 1557   ALKPHOS 109 05/03/2014 0451   BILITOT 0.8 08/03/2020 1557   BILITOT 0.3 05/03/2014 0451   GFRNONAA 6 (L) 08/10/2020 0418   GFRNONAA 47 (L) 05/07/2014 0516   GFRAA 6 (L) 06/30/2020 0611   GFRAA 54 (L) 05/07/2014 0516     No results found.     Assessment & Plan:   1. ESRD (end stage renal disease) ([place]) Recommend:  The patient is experiencing increasing problems with their dialysis access.  Patient should have a fistulagram with the intention for intervention.  The intention for intervention is to restore appropriate flow and prevent thrombosis and possible loss of the access.  As well as improve the quality of dialysis therapy.  The risks, benefits and alternative therapies were reviewed in detail with the patient.  All questions were answered.  The  patient agrees to proceed with angio/intervention.      2. HTN, goal below 140/80 Continue antihypertensive medications as already ordered, these medications have been reviewed and there are no changes at this time.   3. Type 2 diabetes mellitus with other circulatory complication, without  long-term current use of insulin (HCC) Continue hypoglycemic medications as already ordered, these medications have been reviewed and there are no changes at this time.  Hgb A1C to be monitored as already arranged by primary service    Current Outpatient Medications on File Prior to Visit  Medication Sig Dispense Refill  . ALPRAZolam (XANAX) 0.5 MG tablet Take 0.5 mg by mouth 2 (two) times daily as needed for anxiety.     Marland Kitchen amoxicillin (AMOXIL) 875 MG tablet Take 875 mg by mouth 2 (two) times daily.    Marland Kitchen aspirin 81 MG chewable tablet Chew 81 mg by mouth every Monday, Wednesday, and Friday.     . calcium-vitamin D (OSCAL WITH D) 500-200 MG-UNIT tablet Take 1 tablet by mouth daily.    . cetirizine (ZYRTEC) 10 MG tablet Take 10 mg by mouth daily.    . clopidogrel (PLAVIX) 75 MG tablet Take 75 mg by mouth daily.    . Cyanocobalamin 1000 MCG SUBL Take 1,000 mcg by mouth daily.     Marland Kitchen gabapentin (NEURONTIN) 100 MG capsule Take 100 mg by mouth 2 (two) times daily.    Marland Kitchen glimepiride (AMARYL) 4 MG tablet Take 4 mg by mouth daily with breakfast.    . hydrALAZINE (APRESOLINE) 25 MG tablet Take 25 mg by mouth 3 (three) times daily.    Marland Kitchen HYDROcodone-acetaminophen (NORCO/VICODIN) 5-325 MG tablet Take 1 tablet by mouth every 6 (six) hours as needed for moderate pain. 30 tablet 0  . levothyroxine (SYNTHROID, LEVOTHROID) 150 MCG tablet Take 150 mcg daily before breakfast by mouth.    . metoprolol tartrate (LOPRESSOR) 50 MG tablet Take 50 mg by mouth in the morning, at noon, and at bedtime.    . ondansetron (ZOFRAN) 4 MG tablet Take 4 mg by mouth every 8 (eight) hours as needed.    . torsemide (DEMADEX) 100 MG tablet Take 1 tablet (100 mg total) by mouth daily. 30 tablet 0   No current facility-administered medications on file prior to visit.    There are no Patient Instructions on file for this visit. No follow-ups on file.   Kris Hartmann, NP

## 2021-01-04 ENCOUNTER — Telehealth (INDEPENDENT_AMBULATORY_CARE_PROVIDER_SITE_OTHER): Payer: Self-pay

## 2021-01-04 NOTE — Telephone Encounter (Signed)
Spoke with the patient and gave her the updated arrival time of 7:30 am for her procedure to the [facility]. On 01/13/21.

## 2021-01-04 NOTE — Telephone Encounter (Signed)
Spoke with the patient and she is scheduled with Dr. Lucky Cowboy for a left arm fistulagram on 01/13/21 with a 6:45 am arrival time to the [facility]. Covid testing on 01/11/21 between 8-2 pm at the [facility]. Pre-procedure instructions were discussed and will be mailed.

## 2021-01-11 ENCOUNTER — Other Ambulatory Visit
Admission: RE | Admit: 2021-01-11 | Discharge: 2021-01-11 | Disposition: A | Discharge status: Home / Self Care | Payer: Medicare Other | Source: Ambulatory Visit | Attending: Vascular Surgery | Admitting: Vascular Surgery

## 2021-01-11 ENCOUNTER — Other Ambulatory Visit: Payer: Self-pay

## 2021-01-11 ENCOUNTER — Ambulatory Visit (INDEPENDENT_AMBULATORY_CARE_PROVIDER_SITE_OTHER): Payer: Medicare Other | Admitting: Nurse Practitioner

## 2021-01-11 ENCOUNTER — Encounter (INDEPENDENT_AMBULATORY_CARE_PROVIDER_SITE_OTHER): Payer: Medicare Other

## 2021-01-11 ENCOUNTER — Other Ambulatory Visit (INDEPENDENT_AMBULATORY_CARE_PROVIDER_SITE_OTHER): Payer: Self-pay | Admitting: Nurse Practitioner

## 2021-01-11 DIAGNOSIS — Z01812 Encounter for preprocedural laboratory examination: Secondary | ICD-10-CM | POA: Insufficient documentation

## 2021-01-11 DIAGNOSIS — Z20822 Contact with and (suspected) exposure to covid-19: Secondary | ICD-10-CM | POA: Diagnosis not present

## 2021-01-12 LAB — SARS CORONAVIRUS 2 (TAT 6-24 HRS): SARS Coronavirus 2: NEGATIVE

## 2021-01-13 ENCOUNTER — Other Ambulatory Visit: Payer: Self-pay

## 2021-01-13 ENCOUNTER — Ambulatory Visit: Payer: Medicare Other

## 2021-01-13 ENCOUNTER — Encounter: Payer: Self-pay | Admitting: Vascular Surgery

## 2021-01-13 ENCOUNTER — Ambulatory Visit
Admission: RE | Admit: 2021-01-13 | Discharge: 2021-01-13 | Disposition: A | Discharge status: Home / Self Care | Payer: Medicare Other | Attending: Vascular Surgery | Admitting: Vascular Surgery

## 2021-01-13 ENCOUNTER — Encounter
Admission: RE | Disposition: A | Discharge status: Home / Self Care | Payer: Self-pay | Source: Home / Self Care | Attending: Vascular Surgery

## 2021-01-13 DIAGNOSIS — Y841 Kidney dialysis as the cause of abnormal reaction of the patient, or of later complication, without mention of misadventure at the time of the procedure: Secondary | ICD-10-CM | POA: Insufficient documentation

## 2021-01-13 DIAGNOSIS — I120 Hypertensive chronic kidney disease with stage 5 chronic kidney disease or end stage renal disease: Secondary | ICD-10-CM | POA: Diagnosis not present

## 2021-01-13 DIAGNOSIS — N186 End stage renal disease: Secondary | ICD-10-CM

## 2021-01-13 DIAGNOSIS — R0602 Shortness of breath: Secondary | ICD-10-CM

## 2021-01-13 DIAGNOSIS — T82898A Other specified complication of vascular prosthetic devices, implants and grafts, initial encounter: Secondary | ICD-10-CM | POA: Diagnosis not present

## 2021-01-13 DIAGNOSIS — Z888 Allergy status to other drugs, medicaments and biological substances status: Secondary | ICD-10-CM | POA: Insufficient documentation

## 2021-01-13 DIAGNOSIS — Z992 Dependence on renal dialysis: Secondary | ICD-10-CM | POA: Insufficient documentation

## 2021-01-13 DIAGNOSIS — Z91041 Radiographic dye allergy status: Secondary | ICD-10-CM | POA: Insufficient documentation

## 2021-01-13 DIAGNOSIS — E1122 Type 2 diabetes mellitus with diabetic chronic kidney disease: Secondary | ICD-10-CM | POA: Diagnosis not present

## 2021-01-13 DIAGNOSIS — T82858A Stenosis of vascular prosthetic devices, implants and grafts, initial encounter: Secondary | ICD-10-CM | POA: Insufficient documentation

## 2021-01-13 DIAGNOSIS — Z87891 Personal history of nicotine dependence: Secondary | ICD-10-CM | POA: Insufficient documentation

## 2021-01-13 HISTORY — PX: A/V FISTULAGRAM: CATH118298

## 2021-01-13 LAB — GLUCOSE, CAPILLARY: Glucose-Capillary: 95 mg/dL (ref 70–99)

## 2021-01-13 LAB — POTASSIUM (ARMC VASCULAR LAB ONLY): Potassium (ARMC vascular lab): 4.2 (ref 3.5–5.1)

## 2021-01-13 SURGERY — A/V FISTULAGRAM
Anesthesia: Moderate Sedation | Laterality: Left

## 2021-01-13 MED ORDER — ALBUTEROL SULFATE (2.5 MG/3ML) 0.083% IN NEBU
INHALATION_SOLUTION | RESPIRATORY_TRACT | Status: AC
  Administered 2021-01-13: 2.5 mg via RESPIRATORY_TRACT
  Filled 2021-01-13: qty 3

## 2021-01-13 MED ORDER — IODIXANOL 320 MG/ML IV SOLN
INTRAVENOUS | Status: DC | PRN
  Administered 2021-01-13: 25 mL

## 2021-01-13 MED ORDER — DIPHENHYDRAMINE HCL 50 MG/ML IJ SOLN
INTRAMUSCULAR | Status: AC
  Administered 2021-01-13: 50 mg via INTRAVENOUS
  Filled 2021-01-13: qty 1

## 2021-01-13 MED ORDER — DIPHENHYDRAMINE HCL 50 MG/ML IJ SOLN: 50.0000 mg | Freq: Once | INTRAMUSCULAR | Status: AC | PRN

## 2021-01-13 MED ORDER — LORAZEPAM 0.5 MG PO TABS
0.5000 mg | ORAL_TABLET | Freq: Once | ORAL | Status: DC | PRN
  Filled 2021-01-13: qty 1

## 2021-01-13 MED ORDER — FENTANYL CITRATE (PF) 100 MCG/2ML IJ SOLN
INTRAMUSCULAR | Status: DC | PRN
  Administered 2021-01-13: 25 ug via INTRAVENOUS

## 2021-01-13 MED ORDER — FAMOTIDINE 20 MG PO TABS: 40.0000 mg | ORAL_TABLET | Freq: Once | ORAL | Status: AC | PRN

## 2021-01-13 MED ORDER — METHYLPREDNISOLONE SODIUM SUCC 125 MG IJ SOLR
INTRAMUSCULAR | Status: AC
  Administered 2021-01-13: 125 mg via INTRAVENOUS
  Filled 2021-01-13: qty 2

## 2021-01-13 MED ORDER — HEPARIN SODIUM (PORCINE) 1000 UNIT/ML IJ SOLN
INTRAMUSCULAR | Status: DC | PRN
  Administered 2021-01-13: 3000 [IU] via INTRAVENOUS

## 2021-01-13 MED ORDER — METHYLPREDNISOLONE SODIUM SUCC 125 MG IJ SOLR: 125.0000 mg | Freq: Once | INTRAMUSCULAR | Status: AC | PRN

## 2021-01-13 MED ORDER — ALBUTEROL SULFATE (2.5 MG/3ML) 0.083% IN NEBU: 2.5000 mg | INHALATION_SOLUTION | Freq: Once | RESPIRATORY_TRACT | Status: AC

## 2021-01-13 MED ORDER — SODIUM CHLORIDE 0.9 % IV SOLN: INTRAVENOUS | Status: DC

## 2021-01-13 MED ORDER — MIDAZOLAM HCL 2 MG/2ML IJ SOLN
INTRAMUSCULAR | Status: DC | PRN
  Administered 2021-01-13: 1 mg via INTRAVENOUS

## 2021-01-13 MED ORDER — MIDAZOLAM HCL 5 MG/5ML IJ SOLN
INTRAMUSCULAR | Status: AC
  Filled 2021-01-13: qty 5

## 2021-01-13 MED ORDER — CEFAZOLIN SODIUM-DEXTROSE 1-4 GM/50ML-% IV SOLN
INTRAVENOUS | Status: AC
  Filled 2021-01-13: qty 50

## 2021-01-13 MED ORDER — MIDAZOLAM HCL 2 MG/ML PO SYRP: 8.0000 mg | ORAL_SOLUTION | Freq: Once | ORAL | Status: DC | PRN

## 2021-01-13 MED ORDER — FENTANYL CITRATE (PF) 100 MCG/2ML IJ SOLN
INTRAMUSCULAR | Status: AC
  Filled 2021-01-13: qty 2

## 2021-01-13 MED ORDER — FUROSEMIDE 10 MG/ML IJ SOLN
INTRAMUSCULAR | Status: AC
  Filled 2021-01-13: qty 4

## 2021-01-13 MED ORDER — CEFAZOLIN SODIUM-DEXTROSE 1-4 GM/50ML-% IV SOLN
1.0000 g | Freq: Once | INTRAVENOUS | Status: AC
  Administered 2021-01-13: 1 g via INTRAVENOUS

## 2021-01-13 MED ORDER — FAMOTIDINE 20 MG PO TABS
ORAL_TABLET | ORAL | Status: AC
  Administered 2021-01-13: 40 mg via ORAL
  Filled 2021-01-13: qty 2

## 2021-01-13 MED ORDER — HEPARIN SODIUM (PORCINE) 1000 UNIT/ML IJ SOLN
INTRAMUSCULAR | Status: AC
  Filled 2021-01-13: qty 1

## 2021-01-13 MED ORDER — FUROSEMIDE 10 MG/ML IJ SOLN
40.0000 mg | Freq: Once | INTRAMUSCULAR | Status: AC
  Administered 2021-01-13: 40 mg via INTRAVENOUS

## 2021-01-13 MED ORDER — HYDROMORPHONE HCL 1 MG/ML IJ SOLN: 1.0000 mg | Freq: Once | INTRAMUSCULAR | Status: DC | PRN

## 2021-01-13 SURGICAL SUPPLY — 11 items
BALLN LUTONIX DCB 4X60X130 (BALLOONS) ×2
BALLOON LUTONIX DCB 4X60X130 (BALLOONS) ×1 IMPLANT
CANNULA 5F STIFF (CANNULA) ×2 IMPLANT
CATH BEACON 5 .035 40 KMP TP (CATHETERS) ×1 IMPLANT
CATH BEACON 5 .038 40 KMP TP (CATHETERS) ×1
COVER PROBE U/S 5X48 (MISCELLANEOUS) ×2 IMPLANT
GLIDEWIRE ADV .035X180CM (WIRE) ×2 IMPLANT
KIT ENCORE 26 ADVANTAGE (KITS) ×2 IMPLANT
PACK ANGIOGRAPHY (CUSTOM PROCEDURE TRAY) ×2 IMPLANT
SHEATH BRITE TIP 6FRX5.5 (SHEATH) ×2 IMPLANT
SUT MNCRL AB 4-0 PS2 18 (SUTURE) ×2 IMPLANT

## 2021-01-13 NOTE — Op Note (Signed)
[facility]    OPERATIVE NOTE   PROCEDURE: 1.   Left brachiocephalic arteriovenous fistula cannulation under ultrasound guidance 2.   Left arm fistulagram  3.   Percutaneous transluminal angioplasty of anastomotic stenosis with 4 mm diameter Lutonix drug-coated angioplasty balloon  PRE-OPERATIVE DIAGNOSIS: 1. ESRD 2. Poorly functional left brachiocephalic AVF  POST-OPERATIVE DIAGNOSIS: same as above   SURGEON: [name], MD  ANESTHESIA: local with MCS  ESTIMATED BLOOD LOSS: 5 cc  FINDING(S): 1. String sign near occlusive stenosis of the cephalic vein just beyond the anastomosis.  The remainder of the cephalic vein appeared to be patent without obvious stenosis.  There was a large branch in the mid upper arm draining to the deep venous system.  SPECIMEN(S):  None  CONTRAST: 25 cc  FLUORO TIME: 2.3 minutes  MODERATE CONSCIOUS SEDATION TIME: Approximately 21 minutes with 1 mg of Versed and 25 mcg of Fentanyl   INDICATIONS: [name] is a 87 y.o. female who presents with malfunctioning not maturing left brachiocephalic arteriovenous fistula.  The patient is scheduled for left arm fistulagram.  The patient is aware the risks include but are not limited to: bleeding, infection, thrombosis of the cannulated access, and possible anaphylactic reaction to the contrast.  The patient is aware of the risks of the procedure and elects to proceed forward.  DESCRIPTION: After full informed written consent was obtained, the patient was brought back to the angiography suite and placed supine upon the angiography table.  The patient was connected to monitoring equipment. Moderate conscious sedation was administered with a face to face encounter with the patient throughout the procedure with my supervision of the RN administering medicines and monitoring the patient's vital signs and mental status throughout from the start of the procedure until the patient was taken  to the recovery room. The left arm was prepped and draped in the standard fashion for a percutaneous access intervention.  Under ultrasound guidance, the left brachiocephalic arteriovenous fistula was cannulated with a micropuncture needle under direct ultrasound guidance in a retrograde fashion in the proximal to mid upper arm where it was patent and a permanent image was performed.  The microwire was advanced into the fistula and the needle was exchanged for the a microsheath.  I then upsized to a 6 Fr Sheath and imaging was performed.  Hand injections were completed to image the access including the central venous system. This demonstrated a string sign near occlusive stenosis of the cephalic vein just beyond the anastomosis.  The remainder of the cephalic vein appeared to be patent without obvious stenosis.  There was a large branch in the mid upper arm draining to the deep venous system..  Based on the images, this patient will need intervention to this anastomotic stenosis to salvage the fistula. I then gave the patient 3000 units of intravenous heparin.  I then crossed the stenosis with an Advantage wire and Kumpe catheter.  Based on the imaging, a 4 mm x 8 cm Lutonix drug-coated angioplasty balloon was selected.  The balloon was centered around the anastomotic stenosis and inflated to 8 ATM for 1 minute(s).  On completion imaging, a 20-25% residual stenosis was present.     Based on the completion imaging, no further intervention is necessary.  The wire and balloon were removed from the sheath.  A 4-0 Monocryl purse-string suture was sewn around the sheath.  The sheath was removed while tying down the suture.  A sterile bandage was applied  to the puncture site.  COMPLICATIONS: None  CONDITION: Stable   [name]  01/13/2021 10:09 AM   This note was created with Dragon Medical transcription system. Any errors in dictation are purely unintentional.

## 2021-01-13 NOTE — Progress Notes (Signed)
SATURATION QUALIFICATIONS: (This note is used to comply with regulatory documentation for home oxygen)  Patient Saturations on Room Air at Rest = 75%   Patient's oxygen saturation on room air at rest is 75%.  Oxygen saturation on 2 liters nasal cannula 94%.  Unable to wean oxygen so that patient maintains oxygen saturation >92% on room air.

## 2021-01-13 NOTE — Progress Notes (Addendum)
Patient to [facility] with complaints of shortness of breath starting yesterday, room air oxygen saturation 75% at rest upon arrival, improved to 94% with 2liters oxygens via nasal cannula. Patient states she does not use oxygen at home. MD notified, chest xray completed, IV lasix given per MD. Post-procedure patient is alert and oriented, at baseline, unable to wean oxygen to room air, MD notified, albuterol neb administered with no improvement. Per MD, care management consulted for home oxygen set up. Awaiting call back regarding oxygen for home. Patient's husband at bedside. Will continue to monitor. Patient and husband updated.   Patient saturation on room air at rest = 75%

## 2021-01-13 NOTE — Interval H&P Note (Signed)
History and Physical Interval Note:  01/13/2021 8:47 AM  [name]  has presented today for surgery, with the diagnosis of LT arm fistulagram   End Stage Renal Covid April 12.  The various methods of treatment have been discussed with the patient and family. After consideration of risks, benefits and other options for treatment, the patient has consented to  Procedure(s): A/V FISTULAGRAM (Left) as a surgical intervention.  The patient's history has been reviewed, patient examined, no change in status, stable for surgery.  I have reviewed the patient's chart and labs.  Questions were answered to the patient's satisfaction.     [name]

## 2021-02-02 ENCOUNTER — Other Ambulatory Visit (INDEPENDENT_AMBULATORY_CARE_PROVIDER_SITE_OTHER): Payer: Self-pay | Admitting: Vascular Surgery

## 2021-02-02 DIAGNOSIS — Z95820 Peripheral vascular angioplasty status with implants and grafts: Secondary | ICD-10-CM

## 2021-02-02 DIAGNOSIS — N186 End stage renal disease: Secondary | ICD-10-CM

## 2021-02-03 ENCOUNTER — Other Ambulatory Visit: Payer: Self-pay

## 2021-02-03 ENCOUNTER — Ambulatory Visit (INDEPENDENT_AMBULATORY_CARE_PROVIDER_SITE_OTHER): Payer: Medicare Other

## 2021-02-03 ENCOUNTER — Encounter (INDEPENDENT_AMBULATORY_CARE_PROVIDER_SITE_OTHER): Payer: Self-pay | Admitting: Nurse Practitioner

## 2021-02-03 ENCOUNTER — Ambulatory Visit (INDEPENDENT_AMBULATORY_CARE_PROVIDER_SITE_OTHER): Payer: Medicare Other | Admitting: Nurse Practitioner

## 2021-02-03 VITALS — BP 123/70 | HR 77 | Ht 64.0 in | Wt 186.0 lb

## 2021-02-03 DIAGNOSIS — R0902 Hypoxemia: Secondary | ICD-10-CM | POA: Diagnosis not present

## 2021-02-03 DIAGNOSIS — I10 Essential (primary) hypertension: Secondary | ICD-10-CM

## 2021-02-03 DIAGNOSIS — E782 Mixed hyperlipidemia: Secondary | ICD-10-CM | POA: Diagnosis not present

## 2021-02-03 DIAGNOSIS — Z992 Dependence on renal dialysis: Secondary | ICD-10-CM

## 2021-02-03 DIAGNOSIS — N186 End stage renal disease: Secondary | ICD-10-CM

## 2021-02-03 DIAGNOSIS — Z95820 Peripheral vascular angioplasty status with implants and grafts: Secondary | ICD-10-CM | POA: Diagnosis not present

## 2021-02-07 ENCOUNTER — Encounter (INDEPENDENT_AMBULATORY_CARE_PROVIDER_SITE_OTHER): Payer: Self-pay | Admitting: Nurse Practitioner

## 2021-02-07 ENCOUNTER — Telehealth (INDEPENDENT_AMBULATORY_CARE_PROVIDER_SITE_OTHER): Payer: Self-pay

## 2021-02-07 NOTE — Telephone Encounter (Signed)
Patient has been made aware that referral was sent [facility]

## 2021-02-07 NOTE — Progress Notes (Signed)
Subjective:    Patient ID: [name], female    DOB: 29-Oct-1933, 87 y.o.   MRN: [id number] Chief Complaint  Patient presents with  . Follow-up    2wk [facility] post A/V fistulagram. Hda     The patient returns to the office for followup status post intervention of the dialysis access left brachiocephalic AV fistula. Following the intervention the excess function was unchanged per the patient.  The patient continues to be experiencing increased bleeding times following decannulation and increased recirculation with diminished efficiency of their dialysis. The patient denies an increase in arm swelling. At the present time the patient denies hand pain.  Following the most recent intervention the patient actually desaturated during the intervention and required home oxygen.  The patient notes that she has not been able to take off her home oxygen as when she has gone without and checked her pulse oximetry reading she has felt below 85%.  The patient denies amaurosis fugax or recent TIA symptoms. There are no recent neurological changes noted. The patient denies claudication symptoms or rest pain symptoms. The patient denies history of DVT, PE or superficial thrombophlebitis. The patient denies recent episodes of angina or shortness of breath.   Today the patient has a flow volume of 662 this is slightly improved from the previous flow volume of 503.  There still continues to be a stricture noted near the distal upper arm as well as near the anastomosis.   Review of Systems  Cardiovascular: Positive for leg swelling.       Home 02  Musculoskeletal: Positive for gait problem.  All other systems reviewed and are negative.      Objective:   Physical Exam Vitals reviewed.  HENT:     Head: Normocephalic.  Cardiovascular:     Rate and Rhythm: Normal rate.     Pulses: Normal pulses.     Arteriovenous access: left arteriovenous access is present.    Comments: Good thrill and bruit in the  proximal portion Pulmonary:     Effort: Pulmonary effort is normal.  Neurological:     Mental Status: She is alert and oriented to person, place, and time.     Motor: Weakness present.     Gait: Gait abnormal.  Psychiatric:        Mood and Affect: Mood normal.        Behavior: Behavior normal.        Thought Content: Thought content normal.        Judgment: Judgment normal.     BP 123/70   Pulse 77   Ht 5\' 4"  (1.626 m)   Wt 186 lb (84.4 kg)   BMI 31.93 kg/m   Past Medical History:  Diagnosis Date  . Arthritis   . Bilateral Macular degeneration   . Breast cancer ([place]) 2012   right breast  . Breast mass, right   . CAD S/P CABG x 4    a. 09/2012 CABG x 4: LIMA to LAD, SVG to Diag, SVG to OM1, SVG to PDA, EVH from bilateral thighs  . Cancer [facility])    breast cancer, right side 2012  . Complication of anesthesia   . Coronary artery disease 08/22/2012   sees Dr Rockey Situ  . Diabetes ([place])   . Diastolic dysfunction    a. 06/2020 Echo: EF 60-65%; b. 08/2020 Echo: EF 60-65%, no rwma, Gr1 DD. Nl RV size/fxn.  . DOE (dyspnea on exertion)   . ESRD (end stage renal disease) (  [place])   . Gastritis    hx of  . GERD (gastroesophageal reflux disease)   . Headache(784.0)    migraines  . History of breast cancer    39 treatments of radiation. Negative chemo.  Marland Kitchen History of seasonal allergies   . Hyperlipemia   . Hypertension    sees Dr. Fulton Reek  . Hypothyroidism   . Neuropathy   . Personal history of radiation therapy   . Pneumonia    hx of  . PONV (postoperative nausea and vomiting)   . Stroke ([place])    2012  . Vertigo     Social History   Socioeconomic History  . Marital status: Married    Spouse name: [name] "Bud"  . Number of children: 0  . Years of education: Not on file  . Highest education level: Not on file  Occupational History  . Occupation: retired  Tobacco Use  . Smoking status: Former Smoker    Types: Cigarettes    Quit date: 11/13/1988    Years since  quitting: 32.2  . Smokeless tobacco: Never Used  Vaping Use  . Vaping Use: Never used  Substance and Sexual Activity  . Alcohol use: No  . Drug use: No  . Sexual activity: Not on file  Other Topics Concern  . Not on file  Social History Narrative   Lives at home with spouse    Social Determinants of Health   Financial Resource Strain: Not on file  Food Insecurity: Not on file  Transportation Needs: Not on file  Physical Activity: Not on file  Stress: Not on file  Social Connections: Not on file  Intimate Partner Violence: Not on file    Past Surgical History:  Procedure Laterality Date  . A/V FISTULAGRAM Left 01/13/2021   Procedure: A/V FISTULAGRAM;  Surgeon: Algernon Huxley, MD;  Location: [facility];  Service: Cardiovascular;  Laterality: Left;  . ABDOMINAL HYSTERECTOMY    . APPENDECTOMY    . AV FISTULA PLACEMENT Left 11/04/2020   Procedure: ARTERIOVENOUS (AV) FISTULA CREATION (BRACHIOCEPHALIC);  Surgeon: Algernon Huxley, MD;  Location: [facility];  Service: Vascular;  Laterality: Left;  . BACK SURGERY    . BREAST BIOPSY Right    2012 positive- [facility]  . BREAST LUMPECTOMY Right 2012   F/U radiation   . BREAST SURGERY    . CARDIAC CATHETERIZATION  2014  . CAROTID PTA/STENT INTERVENTION Left 06/14/2020   Procedure: CAROTID PTA/STENT INTERVENTION;  Surgeon: Algernon Huxley, MD;  Location: [facility];  Service: Cardiovascular;  Laterality: Left;  . CATARACT EXTRACTION W/PHACO Right 08/14/2017   Procedure: CATARACT EXTRACTION PHACO AND INTRAOCULAR LENS PLACEMENT ([facility]);  Surgeon: Birder Robson, MD;  Location: [facility];  Service: Ophthalmology;  Laterality: Right;  [place] 00:43.0 AP% 17.1 CDE 7.37 Fluid Pack lot # [id number]  . CATARACT EXTRACTION W/PHACO Left 10/09/2017   Procedure: CATARACT EXTRACTION PHACO AND INTRAOCULAR LENS PLACEMENT ([place]);  Surgeon: Birder Robson, MD;  Location: [facility];  Service: Ophthalmology;  Laterality: Left;  [place] 00:30.3 AP% 11.0 CDE  3.33 Fluid Pack Lot # [id number]  . CORONARY ARTERY BYPASS GRAFT  09/05/2012   Procedure: CORONARY ARTERY BYPASS GRAFTING (CABG);  Surgeon: Rexene Alberts, MD;  Location: [place];  Service: Open Heart Surgery;  Laterality: N/A;  CABG x four, using left internal mammary artery and bilateral greater saphenous vein harvested endoscopically  . DIALYSIS/PERMA CATHETER INSERTION Left 06/25/2020   Procedure: DIALYSIS/PERMA CATHETER INSERTION;  Surgeon: [name]  S, MD;  Location: [place] [facility];  Service: Cardiovascular;  Laterality: Left;  . DIALYSIS/PERMA CATHETER INSERTION N/A 08/10/2020   Procedure: DIALYSIS/PERMA CATHETER INSERTION;  Surgeon: Katha Cabal, MD;  Location: [place] [facility];  Service: Cardiovascular;  Laterality: N/A;  . DIALYSIS/PERMA CATHETER REMOVAL N/A 08/05/2020   Procedure: DIALYSIS/PERMA CATHETER REMOVAL;  Surgeon: Algernon Huxley, MD;  Location: [place] [facility];  Service: Cardiovascular;  Laterality: N/A;  . INTRAOPERATIVE TRANSESOPHAGEAL ECHOCARDIOGRAM  09/05/2012   Procedure: INTRAOPERATIVE TRANSESOPHAGEAL ECHOCARDIOGRAM;  Surgeon: Rexene Alberts, MD;  Location: [place];  Service: Open Heart Surgery;  Laterality: N/A;  . KNEE SURGERY     Bilateral nerve block  . LAPAROSCOPIC NISSEN FUNDOPLICATION    . OVARIAN CYST REMOVAL      Family History  Problem Relation Age of Onset  . Heart disease Brother        CABG & stents  . Hyperlipidemia Brother   . Hypertension Brother   . Cancer Father   . Heart disease Mother   . Breast cancer Cousin   . Kidney disease Neg Hx     Allergies  Allergen Reactions  . Clonidine Other (See Comments) and Shortness Of Breath    Other reaction(s): Other (see comments) Other reaction(s): Other (See Comments)  . Darvocet [Propoxyphene N-Acetaminophen] Anaphylaxis  . Hydralazine     Other reaction(s): Other (See Comments) glomerulonephritis  . Hydralazine Hcl Other (See Comments)    glomerulonephritis  . Esomeprazole      Other reaction(s): Unknown  . Guaifenesin     Other reaction(s): Unknown Other reaction(s): Unknown Other reaction(s): Unknown Other reaction(s): Unknown  . Phenylephrine-Guaifenesin     Other reaction(s): Unknown  . Rabeprazole     Other reaction(s): Unknown  . Telithromycin     Other reaction(s): Unknown Other reaction(s): Unknown Other reaction(s): Unknown  . Fluocinolone Other (See Comments)    Feels crazy Other reaction(s): Other (see comments) Other reaction(s): Other (See Comments) Feels crazy Feels crazy  . Iodinated Diagnostic Agents Nausea And Vomiting and Other (See Comments)    Burning Other reaction(s): Other (see comments) Other reaction(s): Other (See Comments) Burning Nausea & vomiting  . Levaquin [Levofloxacin] Nausea Only  . Statins Other (See Comments)    Myalgia Other reaction(s): Other (See Comments), Unknown, Unknown Myalgia    CBC Latest Ref Rng & Units 11/04/2020 08/07/2020 08/06/2020  WBC 4.0 - 10.5 K/uL - 4.7 -  Hemoglobin 12.0 - 15.0 g/dL 13.9 7.4(L) 7.3(L)  Hematocrit 36.0 - 46.0 % 41.0 23.9(L) -  Platelets 150 - 400 K/uL - 92(L) -      CMP     Component Value Date/Time   NA 137 11/04/2020 1208   NA 140 11/22/2015 0830   NA 142 05/07/2014 0516   K 5.5 (H) 11/04/2020 1208   K 3.2 (L) 05/07/2014 0516   CL 103 11/04/2020 1208   CL 104 05/07/2014 0516   CO2 27 08/10/2020 0418   CO2 29 05/07/2014 0516   GLUCOSE 68 (L) 11/04/2020 1208   GLUCOSE 64 (L) 05/07/2014 0516   BUN 36 (H) 11/04/2020 1208   BUN 35 (H) 11/22/2015 0830   BUN 26 (H) 05/07/2014 0516   CREATININE 4.90 (H) 11/04/2020 1208   CREATININE 1.13 05/07/2014 0516   CALCIUM 6.9 (L) 08/10/2020 0418   CALCIUM 7.9 (L) 05/07/2014 0516   PROT 6.5 08/03/2020 1557   PROT 8.0 05/03/2014 0451   ALBUMIN 2.2 (L) 08/05/2020 0817   ALBUMIN 2.9 (L)  05/03/2014 0451   AST 12 (L) 08/03/2020 1557   AST 25 05/03/2014 0451   ALT 7 08/03/2020 1557   ALT 19 05/03/2014 0451   ALKPHOS 68  08/03/2020 1557   ALKPHOS 109 05/03/2014 0451   BILITOT 0.8 08/03/2020 1557   BILITOT 0.3 05/03/2014 0451   GFRNONAA 6 (L) 08/10/2020 0418   GFRNONAA 47 (L) 05/07/2014 0516   GFRAA 6 (L) 06/30/2020 0611   GFRAA 54 (L) 05/07/2014 0516     No results found.     Assessment & Plan:   1. Oxygen desaturation Will refer the patient to pulmonology.  The patient also has an upcoming appointment with her cardiologist, whose input will be appreciated as well. - Ambulatory referral to Pulmonology  2. ESRD on hemodialysis [facility]) The patient very likely does need a fistulogram due to the current HDA however we still have not determined what caused her desaturation during the last intervention.  Because the patient's PermCath maintained and her fistula is not acutely occluded, we have some time before we perform intervention.  We will have the patient return in 1 month for reevaluation of her fistula as well as her respiratory status.  3. Mixed hyperlipidemia Continue statin as ordered and reviewed, no changes at this time   4. Primary hypertension Continue antihypertensive medications as already ordered, these medications have been reviewed and there are no changes at this time.    Current Outpatient Medications on File Prior to Visit  Medication Sig Dispense Refill  . ALPRAZolam (XANAX) 0.5 MG tablet Take 0.5 mg by mouth 2 (two) times daily as needed for anxiety.     Marland Kitchen amoxicillin (AMOXIL) 875 MG tablet Take 875 mg by mouth 2 (two) times daily.    Marland Kitchen aspirin 81 MG chewable tablet Chew 81 mg by mouth every Monday, Wednesday, and Friday.     . calcium-vitamin D (OSCAL WITH D) 500-200 MG-UNIT tablet Take 1 tablet by mouth daily.    . cetirizine (ZYRTEC) 10 MG tablet Take 10 mg by mouth daily.    . clopidogrel (PLAVIX) 75 MG tablet Take 75 mg by mouth daily.    . Cyanocobalamin 1000 MCG SUBL Take 1,000 mcg by mouth daily.     Marland Kitchen gabapentin (NEURONTIN) 100 MG capsule Take 100 mg by mouth 2 (two)  times daily.    Marland Kitchen glimepiride (AMARYL) 4 MG tablet Take 4 mg by mouth daily with breakfast.    . hydrALAZINE (APRESOLINE) 25 MG tablet Take 25 mg by mouth 3 (three) times daily.    Marland Kitchen HYDROcodone-acetaminophen (NORCO/VICODIN) 5-325 MG tablet Take 1 tablet by mouth every 6 (six) hours as needed for moderate pain. 30 tablet 0  . levothyroxine (SYNTHROID, LEVOTHROID) 150 MCG tablet Take 150 mcg daily before breakfast by mouth.    . metoprolol tartrate (LOPRESSOR) 50 MG tablet Take 50 mg by mouth in the morning, at noon, and at bedtime.    . ondansetron (ZOFRAN) 4 MG tablet Take 4 mg by mouth every 8 (eight) hours as needed.    . torsemide (DEMADEX) 100 MG tablet Take 1 tablet (100 mg total) by mouth daily. 30 tablet 0   No current facility-administered medications on file prior to visit.    There are no Patient Instructions on file for this visit. No follow-ups on file.   Kris Hartmann, NP

## 2021-02-21 ENCOUNTER — Ambulatory Visit: Payer: Medicare Other | Admitting: Cardiovascular Disease

## 2021-02-22 ENCOUNTER — Encounter: Payer: Self-pay | Admitting: Cardiovascular Disease

## 2021-02-22 ENCOUNTER — Other Ambulatory Visit: Payer: Self-pay

## 2021-02-22 ENCOUNTER — Ambulatory Visit (INDEPENDENT_AMBULATORY_CARE_PROVIDER_SITE_OTHER): Payer: Medicare Other | Admitting: Cardiovascular Disease

## 2021-02-22 VITALS — BP 154/70 | HR 74 | Ht 64.0 in | Wt 186.2 lb

## 2021-02-22 DIAGNOSIS — I10 Essential (primary) hypertension: Secondary | ICD-10-CM | POA: Diagnosis not present

## 2021-02-22 DIAGNOSIS — E785 Hyperlipidemia, unspecified: Secondary | ICD-10-CM

## 2021-02-22 DIAGNOSIS — R0602 Shortness of breath: Secondary | ICD-10-CM

## 2021-02-22 DIAGNOSIS — I25810 Atherosclerosis of coronary artery bypass graft(s) without angina pectoris: Secondary | ICD-10-CM | POA: Diagnosis not present

## 2021-02-22 DIAGNOSIS — Z992 Dependence on renal dialysis: Secondary | ICD-10-CM

## 2021-02-22 DIAGNOSIS — N186 End stage renal disease: Secondary | ICD-10-CM | POA: Diagnosis not present

## 2021-02-22 DIAGNOSIS — E119 Type 2 diabetes mellitus without complications: Secondary | ICD-10-CM

## 2021-02-22 MED ORDER — TORSEMIDE 100 MG PO TABS: 100.0000 mg | ORAL_TABLET | Freq: Every day | ORAL | 3 refills | Status: DC | PRN

## 2021-02-22 MED ORDER — TORSEMIDE 100 MG PO TABS: 100.0000 mg | ORAL_TABLET | ORAL | 3 refills | Status: DC

## 2021-02-22 NOTE — Patient Instructions (Addendum)
Medication Instructions:  Torsemide 100 mg once a day  on the non dialysis days Tuesday/thursday/sat  If you need a refill on your cardiac medications before your next appointment, please call your pharmacy.    Lab work: No new labs needed   If you have labs (blood work) drawn today and your tests are completely normal, you will receive your results only by: Marland Kitchen MyChart Message (if you have MyChart) OR . A paper copy in the mail If you have any lab test that is abnormal or we need to change your treatment, we will call you to review the results.   Testing/Procedures: No new testing needed   Follow-Up: At [facility], you and your health needs are our priority.  As part of our continuing mission to provide you with exceptional heart care, we have created designated Provider Care Teams.  These Care Teams include your primary Cardiologist (physician) and Advanced Practice Providers (APPs -  Physician Assistants and Nurse Practitioners) who all work together to provide you with the care you need, when you need it.  . You will need a follow up appointment in 6 months  . Providers on your designated Care Team:   . Murray Hodgkins, NP . Christell Faith, PA-C . Marrianne Mood, PA-C  Any Other Special Instructions Will Be Listed Below (If Applicable).  COVID-19 Vaccine Information can be found at: [email or web address].uk For questions related to vaccine distribution or appointments, please email [email or web address] or call [phone].    '

## 2021-02-22 NOTE — Progress Notes (Signed)
Cardiology Office Note  Date:  02/22/2021   ID:  [name], [name] 06-21-1934, MRN [id number]  PCP:  Idelle Crouch, MD   Chief Complaint  Patient presents with  . 6 month follow up     "doing well." Medications reviewed by the patient verbally.     HPI:  [name] is an 87 yo woman with  coronary artery disease,  cardiac catheterization at [facility] 08/22/2012 showing severe distal left main, ostial LAD and circumflex disease also with RCA disease  CABG x4, LIMA to LAD, SVG to Diag, SVG to OM1, SVG to PDA, EVH from bilateral thighs postsurgical stroke with right-sided deficits Chronic renal insufficiency, hydronephrosis ESRD on HD 3x a week anemia who presents for followup today of her coronary artery disease, renal failure  Last seen in clinic by one of our providers January 2022  In the hospital for procedure with Dr. Lucky Cowboy April 2022, Creation of AV fistula  On HD,  mon/wed/fri Started 6 months ago  Chronic foot and leg swelling, can't wear shoes  cxr 12/2020 Mild pulmonary edema.  Unchanged cardiomegaly.Mild pulmonary edema. Unchanged cardiomegaly.  now on oxygen for SOB, especially with exertion  EKG personally reviewed by myself on todays visit NSR rate 74 bpm, poor r wave progression Consider old inferior MI  Other past medical history reviewed In hospital 08/11/2020 infected wound to dorsal aspect of right foot  outpatient dialysis since 07/28/20 wound cultures 07/16/2020 show Pseudomonas, Enterococcus and MSSA.  staph warneri. treated with IV vancomycin and IV Zosyn.    permacath on the right side of her neck that was removed by the vascular surgeon on 08/05/2020.   new tunneled dialysis catheter was inserted via right IJ approach on 08/10/2020.   4-week course of IV vancomycin and 3-week course of IV ceftazidime to be given during dialysis in the outpatient setting. amlodipine and hydralazine were discontinued secondary to hypotension  Reports that  she is concerned about a sacral decub ulcer, As she is sitting for such long periods of time for dialysis She is sedentary, relatively immobile   previously seen Dr. Lucky Cowboy, for venous insufficiency   cardiac catheterization report 08/22/2012 showed distal left main 70% disease, ostial LAD of 80%, proximal LAD 70%, distal LAD diffuse 70%, diagonal #150% followed by discrete 95% lesion, ostial circumflex 90% disease, proximal circumflex 70% disease, proximal RCA 50%, distal RCA 60% PDA branch 60%  PMH:   has a past medical history of Arthritis, Bilateral Macular degeneration, Breast cancer ([place]) (2012), Breast mass, right, CAD S/P CABG x 4, Cancer ([place]), Complication of anesthesia, Coronary artery disease (08/22/2012), Diabetes ([place]), Diastolic dysfunction, DOE (dyspnea on exertion), ESRD (end stage renal disease) ([place]), Gastritis, GERD (gastroesophageal reflux disease), Headache(784.0), History of breast cancer, History of seasonal allergies, Hyperlipemia, Hypertension, Hypothyroidism, Neuropathy, Personal history of radiation therapy, Pneumonia, PONV (postoperative nausea and vomiting), Stroke ([place]), and Vertigo.  PSH:    Past Surgical History:  Procedure Laterality Date  . A/V FISTULAGRAM Left 01/13/2021   Procedure: A/V FISTULAGRAM;  Surgeon: Algernon Huxley, MD;  Location: [facility];  Service: Cardiovascular;  Laterality: Left;  . ABDOMINAL HYSTERECTOMY    . APPENDECTOMY    . AV FISTULA PLACEMENT Left 11/04/2020   Procedure: ARTERIOVENOUS (AV) FISTULA CREATION (BRACHIOCEPHALIC);  Surgeon: Algernon Huxley, MD;  Location: [facility];  Service: Vascular;  Laterality: Left;  . BACK SURGERY    . BREAST BIOPSY Right    2012 positive- [facility]  . BREAST LUMPECTOMY Right  2012   F/U radiation   . BREAST SURGERY    . CARDIAC CATHETERIZATION  2014  . CAROTID PTA/STENT INTERVENTION Left 06/14/2020   Procedure: CAROTID PTA/STENT INTERVENTION;  Surgeon: Algernon Huxley, MD;  Location: [place] [facility];   Service: Cardiovascular;  Laterality: Left;  . CATARACT EXTRACTION W/PHACO Right 08/14/2017   Procedure: CATARACT EXTRACTION PHACO AND INTRAOCULAR LENS PLACEMENT (IOC);  Surgeon: Birder Robson, MD;  Location: [facility];  Service: Ophthalmology;  Laterality: Right;  [place] 00:43.0 AP% 17.1 CDE 7.37 Fluid Pack lot # [id number]  . CATARACT EXTRACTION W/PHACO Left 10/09/2017   Procedure: CATARACT EXTRACTION PHACO AND INTRAOCULAR LENS PLACEMENT ([place]);  Surgeon: Birder Robson, MD;  Location: [facility];  Service: Ophthalmology;  Laterality: Left;  [place] 00:30.3 AP% 11.0 CDE 3.33 Fluid Pack Lot # [id number]  . CORONARY ARTERY BYPASS GRAFT  09/05/2012   Procedure: CORONARY ARTERY BYPASS GRAFTING (CABG);  Surgeon: Rexene Alberts, MD;  Location: [place];  Service: Open Heart Surgery;  Laterality: N/A;  CABG x four, using left internal mammary artery and bilateral greater saphenous vein harvested endoscopically  . DIALYSIS/PERMA CATHETER INSERTION Left 06/25/2020   Procedure: DIALYSIS/PERMA CATHETER INSERTION;  Surgeon: Algernon Huxley, MD;  Location: [place] [facility];  Service: Cardiovascular;  Laterality: Left;  . DIALYSIS/PERMA CATHETER INSERTION N/A 08/10/2020   Procedure: DIALYSIS/PERMA CATHETER INSERTION;  Surgeon: Katha Cabal, MD;  Location: [place] [facility];  Service: Cardiovascular;  Laterality: N/A;  . DIALYSIS/PERMA CATHETER REMOVAL N/A 08/05/2020   Procedure: DIALYSIS/PERMA CATHETER REMOVAL;  Surgeon: Algernon Huxley, MD;  Location: [place] [facility];  Service: Cardiovascular;  Laterality: N/A;  . INTRAOPERATIVE TRANSESOPHAGEAL ECHOCARDIOGRAM  09/05/2012   Procedure: INTRAOPERATIVE TRANSESOPHAGEAL ECHOCARDIOGRAM;  Surgeon: Rexene Alberts, MD;  Location: [place];  Service: Open Heart Surgery;  Laterality: N/A;  . KNEE SURGERY     Bilateral nerve block  . LAPAROSCOPIC NISSEN FUNDOPLICATION    . OVARIAN CYST REMOVAL      Current Outpatient Medications  Medication Sig Dispense Refill  .  ALPRAZolam (XANAX) 0.5 MG tablet Take 0.5 mg by mouth 2 (two) times daily as needed for anxiety.     Marland Kitchen amoxicillin (AMOXIL) 875 MG tablet Take 875 mg by mouth 2 (two) times daily.    Marland Kitchen aspirin 81 MG chewable tablet Chew 81 mg by mouth every Monday, Wednesday, and Friday.     . calcium-vitamin D (OSCAL WITH D) 500-200 MG-UNIT tablet Take 1 tablet by mouth daily.    . cetirizine (ZYRTEC) 10 MG tablet Take 10 mg by mouth daily.    . clopidogrel (PLAVIX) 75 MG tablet Take 75 mg by mouth daily.    . Cyanocobalamin 1000 MCG SUBL Take 1,000 mcg by mouth daily.     Marland Kitchen gabapentin (NEURONTIN) 100 MG capsule Take 100 mg by mouth 2 (two) times daily.    Marland Kitchen glimepiride (AMARYL) 4 MG tablet Take 4 mg by mouth daily with breakfast.    . hydrALAZINE (APRESOLINE) 25 MG tablet Take 25 mg by mouth 3 (three) times daily.    Marland Kitchen HYDROcodone-acetaminophen (NORCO/VICODIN) 5-325 MG tablet Take 1 tablet by mouth every 6 (six) hours as needed for moderate pain. 30 tablet 0  . levothyroxine (SYNTHROID, LEVOTHROID) 150 MCG tablet Take 150 mcg daily before breakfast by mouth.    . metoprolol tartrate (LOPRESSOR) 50 MG tablet Take 50 mg by mouth in the morning, at noon, and at bedtime.    . ondansetron (ZOFRAN) 4 MG tablet  Take 4 mg by mouth every 8 (eight) hours as needed.    . OXYGEN Inhale 2 L into the lungs daily.    Marland Kitchen torsemide (DEMADEX) 100 MG tablet Take 1 tablet (100 mg total) by mouth daily. 30 tablet 0   No current facility-administered medications for this visit.     Allergies:   Clonidine, Darvocet [propoxyphene n-acetaminophen], Hydralazine, Hydralazine hcl, Esomeprazole, Guaifenesin, Phenylephrine-guaifenesin, Rabeprazole, Telithromycin, Fluocinolone, Iodinated diagnostic agents, Levaquin [levofloxacin], and Statins   Social History:  The patient  reports that she quit smoking about 32 years ago. Her smoking use included cigarettes. She has never used smokeless tobacco. She reports that she does not drink alcohol  and does not use drugs.   Family History:   family history includes Breast cancer in her cousin; Cancer in her father; Heart disease in her brother and mother; Hyperlipidemia in her brother; Hypertension in her brother.    Review of Systems: Review of Systems  Constitutional: Negative.   HENT: Negative.   Eyes: Negative.   Respiratory: Negative.   Cardiovascular: Positive for leg swelling.  Gastrointestinal: Negative.   Musculoskeletal: Positive for joint pain.       Gait instability  Neurological: Negative.   Psychiatric/Behavioral: Negative.   All other systems reviewed and are negative.   PHYSICAL EXAM: VS:  BP (!) 154/70 (BP Location: Left Arm, Patient Position: Sitting, Cuff Size: Normal)   Pulse 74   Ht 5\' 4"  (1.626 m)   Wt 186 lb 4 oz (84.5 kg)   SpO2 93%   BMI 31.97 kg/m  , BMI Body mass index is 31.97 kg/m. Constitutional:  oriented to person, place, and time. No distress.  Presents in a wheelchair HENT:  Head: Grossly normal Eyes:  no discharge. No scleral icterus.  Neck: No JVD, no carotid bruits  Cardiovascular: Regular rate and rhythm, no murmurs appreciated 1+ pitting lower extremity edema to the mid shins Pulmonary/Chest: Clear to auscultation bilaterally, no wheezes or rails Abdominal: Soft.  no distension.  no tenderness.  Musculoskeletal: Normal range of motion Neurological:  normal muscle tone. Coordination normal. No atrophy Skin: Skin warm and dry Psychiatric: normal affect, pleasant  Recent Labs: 06/23/2020: B Natriuretic Peptide 488.2; TSH 3.275 08/03/2020: ALT 7 08/07/2020: Platelets 92 11/04/2020: BUN 36; Creatinine, Ser 4.90; Hemoglobin 13.9; Potassium 5.5; Sodium 137    Lipid Panel Lab Results  Component Value Date   CHOL 154 09/09/2012   HDL 31 (L) 09/09/2012   LDLCALC 92 09/09/2012   TRIG 157 (H) 09/09/2012      Wt Readings from Last 3 Encounters:  02/22/21 186 lb 4 oz (84.5 kg)  02/03/21 186 lb (84.4 kg)  01/13/21 186 lb (84.4  kg)      ASSESSMENT AND PLAN:  Essential hypertension -  Blood pressure is well controlled on today's visit. No changes made to the medications.   Coronary artery disease involving coronary bypass graft of native heart with angina pectoris ([place]) - Currently with no symptoms of angina. No further workup at this time. Continue current medication regimen.  Pulmonary edema Seen on x-ray, continued leg swelling, shortness of breath on exertion now on oxygen Recommended she add torsemide 100 mg on nondialysis days She does not want to extend her dialysis time  Mixed hyperlipidemia  declined cholesterol medication Numbers have improved with weight loss  S/P CABG x 4  Denies angina, very sedentary  End-stage renal disease on hemodialysis Monday Wednesday Friday, Has been very resistant to adding extra time for fluid  Anemia, unspecified type  Followed by nephrology  Weakness Had nerve blocks to her knees bilaterally  Sedentary at baseline, high fall risk   Total encounter time more than 25 minutes  Greater than 50% was spent in counseling and coordination of care with the patient     Orders Placed This Encounter  Procedures  . EKG 12-Lead     Signed, Esmond Plants, M.D., Ph.D. 02/22/2021  [place], [place]

## 2021-03-04 ENCOUNTER — Other Ambulatory Visit (INDEPENDENT_AMBULATORY_CARE_PROVIDER_SITE_OTHER): Payer: Self-pay | Admitting: Nurse Practitioner

## 2021-03-04 DIAGNOSIS — T829XXS Unspecified complication of cardiac and vascular prosthetic device, implant and graft, sequela: Secondary | ICD-10-CM

## 2021-03-04 DIAGNOSIS — N186 End stage renal disease: Secondary | ICD-10-CM

## 2021-03-08 ENCOUNTER — Encounter (INDEPENDENT_AMBULATORY_CARE_PROVIDER_SITE_OTHER): Payer: Self-pay | Admitting: Vascular Surgery

## 2021-03-08 ENCOUNTER — Ambulatory Visit (INDEPENDENT_AMBULATORY_CARE_PROVIDER_SITE_OTHER): Payer: Medicare Other | Admitting: Vascular Surgery

## 2021-03-08 ENCOUNTER — Other Ambulatory Visit: Payer: Self-pay

## 2021-03-08 ENCOUNTER — Ambulatory Visit (INDEPENDENT_AMBULATORY_CARE_PROVIDER_SITE_OTHER): Payer: Medicare Other

## 2021-03-08 ENCOUNTER — Telehealth (INDEPENDENT_AMBULATORY_CARE_PROVIDER_SITE_OTHER): Payer: Self-pay

## 2021-03-08 VITALS — BP 146/73 | HR 83 | Ht 64.0 in | Wt 185.0 lb

## 2021-03-08 DIAGNOSIS — N186 End stage renal disease: Secondary | ICD-10-CM

## 2021-03-08 DIAGNOSIS — T829XXS Unspecified complication of cardiac and vascular prosthetic device, implant and graft, sequela: Secondary | ICD-10-CM

## 2021-03-08 DIAGNOSIS — L97311 Non-pressure chronic ulcer of right ankle limited to breakdown of skin: Secondary | ICD-10-CM | POA: Insufficient documentation

## 2021-03-08 DIAGNOSIS — Z992 Dependence on renal dialysis: Secondary | ICD-10-CM

## 2021-03-08 DIAGNOSIS — I6523 Occlusion and stenosis of bilateral carotid arteries: Secondary | ICD-10-CM | POA: Diagnosis not present

## 2021-03-08 DIAGNOSIS — E782 Mixed hyperlipidemia: Secondary | ICD-10-CM

## 2021-03-08 DIAGNOSIS — I10 Essential (primary) hypertension: Secondary | ICD-10-CM | POA: Diagnosis not present

## 2021-03-08 NOTE — Assessment & Plan Note (Signed)
With her swelling and blood blister/ulcer just above the right lateral ankle, I think the most prudent option would be to place her in an [place] boot at this time.  We will change this weekly for the next few weeks and hopefully by then the wound will be healed.

## 2021-03-08 NOTE — Assessment & Plan Note (Signed)
The duplex today shows her volume to be up over thousand although there is still at least a moderate stenosis in the perianastomotic cephalic vein.  I think her fistula is actually ready to be used whenever felt safe by the dialysis center.  If it is not functional when attempted, we can do a fistulogram to address the moderate stenosis of the perianastomotic cephalic vein, given her very poor functional status and oxygen requirement I would like to avoid that for now if possible.  I will plan to see her back in 3 months to recheck this.

## 2021-03-08 NOTE — Progress Notes (Signed)
MRN : [id number]  [name] is a 87 y.o. (May 22, 1934) female who presents with chief complaint of  Chief Complaint  Patient presents with  . Follow-up    4 wk U/S  .  History of Present Illness: Patient returns today in follow up of her AVF.  It is still not being used for dialysis and the dialysis center is waiting until she gets an appointment with a pulmonologist.  It is much more visible.  The duplex today shows her volume to be up over thousand although there is still at least a moderate stenosis in the perianastomotic cephalic vein.  Current Outpatient Medications  Medication Sig Dispense Refill  . ALPRAZolam (XANAX) 0.5 MG tablet Take 0.5 mg by mouth 2 (two) times daily as needed for anxiety.     Marland Kitchen amoxicillin (AMOXIL) 875 MG tablet Take 875 mg by mouth 2 (two) times daily.    Marland Kitchen aspirin 81 MG chewable tablet Chew 81 mg by mouth every Monday, Wednesday, and Friday.     . calcium-vitamin D (OSCAL WITH D) 500-200 MG-UNIT tablet Take 1 tablet by mouth daily.    . cetirizine (ZYRTEC) 10 MG tablet Take 10 mg by mouth daily.    . clopidogrel (PLAVIX) 75 MG tablet Take 75 mg by mouth daily.    . Cyanocobalamin 1000 MCG SUBL Take 1,000 mcg by mouth daily.     Marland Kitchen gabapentin (NEURONTIN) 100 MG capsule Take 100 mg by mouth 2 (two) times daily.    Marland Kitchen glimepiride (AMARYL) 4 MG tablet Take 4 mg by mouth daily with breakfast.    . hydrALAZINE (APRESOLINE) 25 MG tablet Take 25 mg by mouth 3 (three) times daily.    Marland Kitchen HYDROcodone-acetaminophen (NORCO/VICODIN) 5-325 MG tablet Take 1 tablet by mouth every 6 (six) hours as needed for moderate pain. 30 tablet 0  . levothyroxine (SYNTHROID, LEVOTHROID) 150 MCG tablet Take 150 mcg daily before breakfast by mouth.    . metoprolol tartrate (LOPRESSOR) 50 MG tablet Take 50 mg by mouth in the morning, at noon, and at bedtime.    . OXYGEN Inhale 2 L into the lungs daily.    Marland Kitchen torsemide (DEMADEX) 100 MG tablet Take 1 tablet (100 mg total) by mouth 3  (three) times a week. Tuesday, Thursday, & Saturday (non dialysis days) 50 tablet 3   No current facility-administered medications for this visit.    Past Medical History:  Diagnosis Date  . Arthritis   . Bilateral Macular degeneration   . Breast cancer ([place]) 2012   right breast  . Breast mass, right   . CAD S/P CABG x 4    a. 09/2012 CABG x 4: LIMA to LAD, SVG to Diag, SVG to OM1, SVG to PDA, EVH from bilateral thighs  . Cancer [facility])    breast cancer, right side 2012  . Complication of anesthesia   . Coronary artery disease 08/22/2012   sees Dr Rockey Situ  . Diabetes ([place])   . Diastolic dysfunction    a. 06/2020 Echo: EF 60-65%; b. 08/2020 Echo: EF 60-65%, no rwma, Gr1 DD. Nl RV size/fxn.  . DOE (dyspnea on exertion)   . ESRD (end stage renal disease) ([place])   . Gastritis    hx of  . GERD (gastroesophageal reflux disease)   . Headache(784.0)    migraines  . History of breast cancer    39 treatments of radiation. Negative chemo.  Marland Kitchen History of seasonal allergies   . Hyperlipemia   .  Hypertension    sees Dr. Fulton Reek  . Hypothyroidism   . Neuropathy   . Personal history of radiation therapy   . Pneumonia    hx of  . PONV (postoperative nausea and vomiting)   . Stroke ([place])    2012  . Vertigo     Past Surgical History:  Procedure Laterality Date  . A/V FISTULAGRAM Left 01/13/2021   Procedure: A/V FISTULAGRAM;  Surgeon: Algernon Huxley, MD;  Location: [place] [facility];  Service: Cardiovascular;  Laterality: Left;  . ABDOMINAL HYSTERECTOMY    . APPENDECTOMY    . AV FISTULA PLACEMENT Left 11/04/2020   Procedure: ARTERIOVENOUS (AV) FISTULA CREATION (BRACHIOCEPHALIC);  Surgeon: Algernon Huxley, MD;  Location: [facility];  Service: Vascular;  Laterality: Left;  . BACK SURGERY    . BREAST BIOPSY Right    2012 positive- [facility]  . BREAST LUMPECTOMY Right 2012   F/U radiation   . BREAST SURGERY    . CARDIAC CATHETERIZATION  2014  . CAROTID PTA/STENT INTERVENTION Left 06/14/2020    Procedure: CAROTID PTA/STENT INTERVENTION;  Surgeon: Algernon Huxley, MD;  Location: [place] [facility];  Service: Cardiovascular;  Laterality: Left;  . CATARACT EXTRACTION W/PHACO Right 08/14/2017   Procedure: CATARACT EXTRACTION PHACO AND INTRAOCULAR LENS PLACEMENT (I[facility]);  Surgeon: Birder Robson, MD;  Location: [facility];  Service: Ophthalmology;  Laterality: Right;  [place] 00:43.0 AP% 17.1 CDE 7.37 Fluid Pack lot # [id number]  . CATARACT EXTRACTION W/PHACO Left 10/09/2017   Procedure: CATARACT EXTRACTION PHACO AND INTRAOCULAR LENS PLACEMENT ([place]);  Surgeon: Birder Robson, MD;  Location: [facility];  Service: Ophthalmology;  Laterality: Left;  [place] 00:30.3 AP% 11.0 CDE 3.33 Fluid Pack Lot # [id number]  . CORONARY ARTERY BYPASS GRAFT  09/05/2012   Procedure: CORONARY ARTERY BYPASS GRAFTING (CABG);  Surgeon: Rexene Alberts, MD;  Location: [place];  Service: Open Heart Surgery;  Laterality: N/A;  CABG x four, using left internal mammary artery and bilateral greater saphenous vein harvested endoscopically  . DIALYSIS/PERMA CATHETER INSERTION Left 06/25/2020   Procedure: DIALYSIS/PERMA CATHETER INSERTION;  Surgeon: Algernon Huxley, MD;  Location: [place] [facility];  Service: Cardiovascular;  Laterality: Left;  . DIALYSIS/PERMA CATHETER INSERTION N/A 08/10/2020   Procedure: DIALYSIS/PERMA CATHETER INSERTION;  Surgeon: Katha Cabal, MD;  Location: [place] [facility];  Service: Cardiovascular;  Laterality: N/A;  . DIALYSIS/PERMA CATHETER REMOVAL N/A 08/05/2020   Procedure: DIALYSIS/PERMA CATHETER REMOVAL;  Surgeon: Algernon Huxley, MD;  Location: [place] [facility];  Service: Cardiovascular;  Laterality: N/A;  . INTRAOPERATIVE TRANSESOPHAGEAL ECHOCARDIOGRAM  09/05/2012   Procedure: INTRAOPERATIVE TRANSESOPHAGEAL ECHOCARDIOGRAM;  Surgeon: Rexene Alberts, MD;  Location: [place];  Service: Open Heart Surgery;  Laterality: N/A;  . KNEE SURGERY     Bilateral nerve block  . LAPAROSCOPIC NISSEN FUNDOPLICATION     . OVARIAN CYST REMOVAL       Social History   Tobacco Use  . Smoking status: Former Smoker    Types: Cigarettes    Quit date: 11/13/1988    Years since quitting: 32.3  . Smokeless tobacco: Never Used  Vaping Use  . Vaping Use: Never used  Substance Use Topics  . Alcohol use: No  . Drug use: No     Family History  Problem Relation Age of Onset  . Heart disease Brother        CABG & stents  . Hyperlipidemia Brother   . Hypertension Brother   . Cancer Father   .  Heart disease Mother   . Breast cancer Cousin   . Kidney disease Neg Hx      Allergies  Allergen Reactions  . Clonidine Other (See Comments) and Shortness Of Breath    Other reaction(s): Other (see comments) Other reaction(s): Other (See Comments)  . Darvocet [Propoxyphene N-Acetaminophen] Anaphylaxis  . Hydralazine     Other reaction(s): Other (See Comments) glomerulonephritis  . Hydralazine Hcl Other (See Comments)    glomerulonephritis  . Esomeprazole     Other reaction(s): Unknown  . Guaifenesin     Other reaction(s): Unknown Other reaction(s): Unknown Other reaction(s): Unknown Other reaction(s): Unknown  . Phenylephrine-Guaifenesin     Other reaction(s): Unknown  . Rabeprazole     Other reaction(s): Unknown  . Telithromycin     Other reaction(s): Unknown Other reaction(s): Unknown Other reaction(s): Unknown  . Fluocinolone Other (See Comments)    Feels crazy Other reaction(s): Other (see comments) Other reaction(s): Other (See Comments) Feels crazy Feels crazy  . Iodinated Diagnostic Agents Nausea And Vomiting and Other (See Comments)    Burning Other reaction(s): Other (see comments) Other reaction(s): Other (See Comments) Burning Nausea & vomiting  . Levaquin [Levofloxacin] Nausea Only  . Statins Other (See Comments)    Myalgia Other reaction(s): Other (See Comments), Unknown, Unknown Myalgia     REVIEW OF SYSTEMS(Negative unless checked)  Constitutional: [] ???Weight  loss [] ???Fever [] ???Chills Cardiac: [] ???Chest pain [] ???Chest pressure [] ???Palpitations [] ???Shortness of breath when laying flat [] ???Shortness of breath at rest [x] ???Shortness of breath with exertion. Vascular: [] ???Pain in legs with walking [] ???Pain in legs at rest [] ???Pain in legs when laying flat [] ???Claudication [] ???Pain in feet when walking [] ???Pain in feet at rest [] ???Pain in feet when laying flat [] ???History of DVT [] ???Phlebitis [] ???Swelling in legs [] ???Varicose veins [] ???Non-healing ulcers Pulmonary: [x] ???Uses home oxygen [] ???Productive cough [] ???Hemoptysis [] ???Wheeze [x] ???COPD [] ???Asthma Neurologic: [] ???Dizziness [] ???Blackouts [] ???Seizures [x] ???History of stroke [] ???History of TIA [] ???Aphasia [] ???Temporary blindness [] ???Dysphagia [] ???Weakness or numbness in arms [] ???Weakness or numbness in legs Musculoskeletal: [x] ???Arthritis [] ???Joint swelling [x] ???Joint pain [] ???Low back pain Hematologic: [x] ???Easy bruising [] ???Easy bleeding [] ???Hypercoagulable state [] ???Anemic [] ???Hepatitis Gastrointestinal: [] ???Blood in stool [] ???Vomiting blood [x] ???Gastroesophageal reflux/heartburn [] ???Abdominal pain Genitourinary: [x] ???Chronic kidney disease [] ???Difficult urination [] ???Frequent urination [] ???Burning with urination [] ???Hematuria Skin: [] ???Rashes [] ???Ulcers [] ???Wounds Psychological: [] ???History of anxiety [] ???History of major depression.  Physical Examination  BP (!) 146/73   Pulse 83   Ht 5\' 4"  (1.626 m)   Wt 185 lb (83.9 kg)   BMI 31.76 kg/m  Gen:  WD/WN, NAD Head: /AT, No temporalis wasting. Ear/Nose/Throat: Hearing grossly intact, nares w/o erythema or drainage Eyes: Conjunctiva clear. Sclera non-icteric Neck: Supple.  Trachea midline Pulmonary:  Good air movement, no use of accessory muscles.  Cardiac: irregular  Vascular: good thrill  in left arm AVF Vessel Right Left  Radial Palpable Palpable               Musculoskeletal: M/S 5/5 throughout.  No deformity or atrophy.  Oval-shaped blood blister slightly smaller than a quarter on the lateral aspect of the right lower leg just above the ankle.  2+ bilateral lower extremity edema. Neurologic: Sensation grossly intact in extremities.  Symmetrical.  Speech is fluent.  Psychiatric: Judgment intact, Mood & affect appropriate for pt's clinical situation. Dermatologic: Blood blister present as above on the right leg       Labs Recent Results (from the past 2160 hour(s))  SARS CORONAVIRUS 2 (TAT 6-24 HRS) Nasopharyngeal Nasopharyngeal Swab     Status: None   Collection Time: 01/11/21 12:34  PM   Specimen: Nasopharyngeal Swab  Result Value Ref Range   SARS Coronavirus 2 NEGATIVE NEGATIVE    Comment: (NOTE) SARS-CoV-2 target nucleic acids are NOT DETECTED.  The SARS-CoV-2 RNA is generally detectable in upper and lower respiratory specimens during the acute phase of infection. Negative results do not preclude SARS-CoV-2 infection, do not rule out co-infections with other pathogens, and should not be used as the sole basis for treatment or other patient management decisions. Negative results must be combined with clinical observations, patient history, and epidemiological information. The expected result is Negative.  Fact Sheet for Patients: SugarRoll.be  Fact Sheet for Healthcare Providers: https://www.woods-mathews.com/  This test is not yet approved or cleared by the [place] FDA and  has been authorized for detection and/or diagnosis of SARS-CoV-2 by FDA under an Emergency Use Authorization (EUA). This EUA will remain  in effect (meaning this test can be used) for the duration of the COVID-19 declaration under Se ction 564(b)(1) of the Act, 21 U.S.C. section 360bbb-3(b)(1), unless the authorization is terminated  or revoked sooner.  Performed at [facility], [place] [address], [place], [place] [address]   Glucose, capillary     Status: None   Collection Time: 01/13/21  8:04 AM  Result Value Ref Range   Glucose-Capillary 95 70 - 99 mg/dL    Comment: Glucose reference range applies only to samples taken after fasting for at least 8 hours.  Potassium [facility] vascular lab only)     Status: None   Collection Time: 01/13/21  8:07 AM  Result Value Ref Range   Potassium [facility] vascular lab) 4.2 3.5 - 5.1    Comment: Performed at [facility], [address], [place], [place] [address]    Radiology No results found.  Assessment/Plan Diabetes mellitus ([place]) blood glucose control important in reducing the progression of atherosclerotic disease. Also, involved in wound healing. On appropriate medications.   Hyperlipidemia lipid control important in reducing the progression of atherosclerotic disease. Continue statin therapy  Carotid stenosis We performed a carotid duplex recently which correlates much more with her catheter-based angiogram findings and would be in the 40 to 59% range bilaterally.Particularly given her extensive medical comorbidities, no intervention or surgery would be recommended at this time. Continue current medical regimen. Recheck later this year.  HTN, goal below 140/80 blood pressure control important in reducing the progression of atherosclerotic disease. On appropriate oral medications.  ESRD (end stage renal disease) on dialysis ([place]) The duplex today shows her volume to be up over thousand although there is still at least a moderate stenosis in the perianastomotic cephalic vein.  I think her fistula is actually ready to be used whenever felt safe by the dialysis center.  If it is not functional when attempted, we can do a fistulogram to address the moderate stenosis of the perianastomotic cephalic vein, given her very poor functional status and oxygen  requirement I would like to avoid that for now if possible.  I will plan to see her back in 3 months to recheck this.  Lower limb ulcer, ankle, right, limited to breakdown of skin ([place]) With her swelling and blood blister/ulcer just above the right lateral ankle, I think the most prudent option would be to place her in an Unna boot at this time.  We will change this weekly for the next few weeks and hopefully by then the wound will be healed.    [name], MD  03/08/2021 12:38 PM  This note was created with Dragon medical transcription system.  Any errors from dictation are purely unintentional

## 2021-03-08 NOTE — Telephone Encounter (Signed)
A referral has been faxed over to [facility] for right leg unna wrap to be done weekly.

## 2021-03-09 NOTE — Telephone Encounter (Signed)
[facility] will not be able to start services due to short staffing. I sent the referral to [facility] to see if they will be able to start services.

## 2021-03-09 NOTE — Telephone Encounter (Signed)
[facility] will not be able start services. [facility] will be taking the patient and start weekly right leg unna wraps

## 2021-03-10 ENCOUNTER — Telehealth (INDEPENDENT_AMBULATORY_CARE_PROVIDER_SITE_OTHER): Payer: Self-pay | Admitting: Vascular Surgery

## 2021-03-10 NOTE — Telephone Encounter (Signed)
completed

## 2021-03-10 NOTE — Telephone Encounter (Signed)
Patient left vm wanting to inquire if a person was found to come out to her house for care.  Please advise.

## 2021-03-15 ENCOUNTER — Encounter (INDEPENDENT_AMBULATORY_CARE_PROVIDER_SITE_OTHER): Payer: Medicare Other

## 2021-03-22 ENCOUNTER — Telehealth (INDEPENDENT_AMBULATORY_CARE_PROVIDER_SITE_OTHER): Payer: Self-pay

## 2021-03-22 ENCOUNTER — Encounter (INDEPENDENT_AMBULATORY_CARE_PROVIDER_SITE_OTHER): Payer: Medicare Other

## 2021-03-22 NOTE — Telephone Encounter (Signed)
That is fine 

## 2021-03-22 NOTE — Telephone Encounter (Signed)
Amy PT with [facility]  called and wants to know can she have a verbal order for 2 times  for 3 weeks and  1 time for 1 week. Please advise. [phone]

## 2021-03-23 NOTE — Telephone Encounter (Signed)
I called PT back and made her aware of the NP's instructions.

## 2021-03-29 ENCOUNTER — Other Ambulatory Visit: Payer: Self-pay

## 2021-03-29 ENCOUNTER — Encounter (INDEPENDENT_AMBULATORY_CARE_PROVIDER_SITE_OTHER): Payer: Self-pay | Admitting: Vascular Surgery

## 2021-03-29 ENCOUNTER — Ambulatory Visit (INDEPENDENT_AMBULATORY_CARE_PROVIDER_SITE_OTHER): Payer: Medicare Other | Admitting: Vascular Surgery

## 2021-03-29 VITALS — BP 158/79 | HR 84 | Resp 16

## 2021-03-29 DIAGNOSIS — I10 Essential (primary) hypertension: Secondary | ICD-10-CM | POA: Diagnosis not present

## 2021-03-29 DIAGNOSIS — L97311 Non-pressure chronic ulcer of right ankle limited to breakdown of skin: Secondary | ICD-10-CM | POA: Diagnosis not present

## 2021-03-29 DIAGNOSIS — E1121 Type 2 diabetes mellitus with diabetic nephropathy: Secondary | ICD-10-CM

## 2021-03-29 DIAGNOSIS — M7989 Other specified soft tissue disorders: Secondary | ICD-10-CM | POA: Insufficient documentation

## 2021-03-29 DIAGNOSIS — I6523 Occlusion and stenosis of bilateral carotid arteries: Secondary | ICD-10-CM | POA: Diagnosis not present

## 2021-03-29 NOTE — Assessment & Plan Note (Signed)
A 3 layer Unna boot was placed today and will be changed weekly.  Reassess in about 4 weeks.

## 2021-03-29 NOTE — Assessment & Plan Note (Signed)
Her right leg swelling is better with Unna boots, but her left leg swelling is worse.  Were going to wrap her in Unna boots bilaterally today.

## 2021-03-29 NOTE — Progress Notes (Signed)
MRN : [id number]  [name] is a 87 y.o. (07-09-1934) female who presents with chief complaint of  Chief Complaint  Patient presents with   Follow-up    Right le unna wrap follow up  .  History of Present Illness: Patient returns today in follow up of leg swelling and right lateral ankle ulceration.  Her ulcer is smaller and clean, but still present.  Her swelling on the right leg is significantly better with several weeks of Unna boots.  Her left leg swelling, however, has gotten significantly worse.  Her cardiologist gave her some torsemide but this has not helped.  She continues dialysis several times a week and her PermCath is working reasonably well.  They have not yet used her fistula for dialysis.  No fevers or chills.  She remains on 3 L of oxygen and is scheduled to see a pulmonary doctor next month.  Current Outpatient Medications  Medication Sig Dispense Refill   ALPRAZolam (XANAX) 0.5 MG tablet Take 0.5 mg by mouth 2 (two) times daily as needed for anxiety.      amoxicillin (AMOXIL) 875 MG tablet Take 875 mg by mouth 2 (two) times daily.     aspirin 81 MG chewable tablet Chew 81 mg by mouth every Monday, Wednesday, and Friday.      calcium-vitamin D (OSCAL WITH D) 500-200 MG-UNIT tablet Take 1 tablet by mouth daily.     cetirizine (ZYRTEC) 10 MG tablet Take 10 mg by mouth daily.     clopidogrel (PLAVIX) 75 MG tablet Take 75 mg by mouth daily.     Cyanocobalamin 1000 MCG SUBL Take 1,000 mcg by mouth daily.      gabapentin (NEURONTIN) 100 MG capsule Take 100 mg by mouth 2 (two) times daily.     glimepiride (AMARYL) 4 MG tablet Take 4 mg by mouth daily with breakfast.     hydrALAZINE (APRESOLINE) 25 MG tablet Take 25 mg by mouth 3 (three) times daily.     HYDROcodone-acetaminophen (NORCO/VICODIN) 5-325 MG tablet Take 1 tablet by mouth every 6 (six) hours as needed for moderate pain. 30 tablet 0   levothyroxine (SYNTHROID, LEVOTHROID) 150 MCG tablet Take 150 mcg daily  before breakfast by mouth.     metoprolol tartrate (LOPRESSOR) 50 MG tablet Take 50 mg by mouth in the morning, at noon, and at bedtime.     OXYGEN Inhale 2 L into the lungs daily.     torsemide (DEMADEX) 100 MG tablet Take 1 tablet (100 mg total) by mouth 3 (three) times a week. Tuesday, Thursday, & Saturday (non dialysis days) 50 tablet 3   No current facility-administered medications for this visit.    Past Medical History:  Diagnosis Date   Arthritis    Bilateral Macular degeneration    Breast cancer ([place]) 2012   right breast   Breast mass, right    CAD S/P CABG x 4    a. 09/2012 CABG x 4: LIMA to LAD, SVG to Diag, SVG to OM1, SVG to PDA, EVH from bilateral thighs   Cancer ([place])    breast cancer, right side 4098   Complication of anesthesia    Coronary artery disease 08/22/2012   sees Dr Rockey Situ   Diabetes [facility])    Diastolic dysfunction    a. 06/2020 Echo: EF 60-65%; b. 08/2020 Echo: EF 60-65%, no rwma, Gr1 DD. Nl RV size/fxn.   DOE (dyspnea on exertion)    ESRD (end stage renal disease) ([place])  Gastritis    hx of   GERD (gastroesophageal reflux disease)    Headache(784.0)    migraines   History of breast cancer    39 treatments of radiation. Negative chemo.   History of seasonal allergies    Hyperlipemia    Hypertension    sees Dr. Fulton Reek   Hypothyroidism    Neuropathy    Personal history of radiation therapy    Pneumonia    hx of   PONV (postoperative nausea and vomiting)    Stroke [facility])    2012   Vertigo     Past Surgical History:  Procedure Laterality Date   A/V FISTULAGRAM Left 01/13/2021   Procedure: A/V FISTULAGRAM;  Surgeon: Algernon Huxley, MD;  Location: [place] [facility];  Service: Cardiovascular;  Laterality: Left;   ABDOMINAL HYSTERECTOMY     APPENDECTOMY     AV FISTULA PLACEMENT Left 11/04/2020   Procedure: ARTERIOVENOUS (AV) FISTULA CREATION (BRACHIOCEPHALIC);  Surgeon: Algernon Huxley, MD;  Location: [facility];  Service: Vascular;   Laterality: Left;   BACK SURGERY     BREAST BIOPSY Right    2012 positive- [facility]   BREAST LUMPECTOMY Right 2012   F/U radiation    BREAST SURGERY     CARDIAC CATHETERIZATION  2014   CAROTID PTA/STENT INTERVENTION Left 06/14/2020   Procedure: CAROTID PTA/STENT INTERVENTION;  Surgeon: Algernon Huxley, MD;  Location: [place] [facility];  Service: Cardiovascular;  Laterality: Left;   CATARACT EXTRACTION W/PHACO Right 08/14/2017   Procedure: CATARACT EXTRACTION PHACO AND INTRAOCULAR LENS PLACEMENT (IOC);  Surgeon: Birder Robson, MD;  Location: [facility];  Service: Ophthalmology;  Laterality: Right;  [place] 00:43.0 AP% 17.1 CDE 7.37 Fluid Pack lot # [id number]   CATARACT EXTRACTION W/PHACO Left 10/09/2017   Procedure: CATARACT EXTRACTION PHACO AND INTRAOCULAR LENS PLACEMENT (IOC);  Surgeon: Birder Robson, MD;  Location: [facility];  Service: Ophthalmology;  Laterality: Left;  [place] 00:30.3 AP% 11.0 CDE 3.33 Fluid Pack Lot # [id number]   CORONARY ARTERY BYPASS GRAFT  09/05/2012   Procedure: CORONARY ARTERY BYPASS GRAFTING (CABG);  Surgeon: Rexene Alberts, MD;  Location: [place];  Service: Open Heart Surgery;  Laterality: N/A;  CABG x four, using left internal mammary artery and bilateral greater saphenous vein harvested endoscopically   DIALYSIS/PERMA CATHETER INSERTION Left 06/25/2020   Procedure: DIALYSIS/PERMA CATHETER INSERTION;  Surgeon: Algernon Huxley, MD;  Location: [place] [facility];  Service: Cardiovascular;  Laterality: Left;   DIALYSIS/PERMA CATHETER INSERTION N/A 08/10/2020   Procedure: DIALYSIS/PERMA CATHETER INSERTION;  Surgeon: Katha Cabal, MD;  Location: [place] [facility];  Service: Cardiovascular;  Laterality: N/A;   DIALYSIS/PERMA CATHETER REMOVAL N/A 08/05/2020   Procedure: DIALYSIS/PERMA CATHETER REMOVAL;  Surgeon: Algernon Huxley, MD;  Location: [place] [facility];  Service: Cardiovascular;  Laterality: N/A;   INTRAOPERATIVE TRANSESOPHAGEAL ECHOCARDIOGRAM  09/05/2012   Procedure:  INTRAOPERATIVE TRANSESOPHAGEAL ECHOCARDIOGRAM;  Surgeon: Rexene Alberts, MD;  Location: [place];  Service: Open Heart Surgery;  Laterality: N/A;   KNEE SURGERY     Bilateral nerve block   LAPAROSCOPIC NISSEN FUNDOPLICATION     OVARIAN CYST REMOVAL       Social History   Tobacco Use   Smoking status: Former    Pack years: 0.00    Types: Cigarettes    Quit date: 11/13/1988    Years since quitting: 32.3   Smokeless tobacco: Never  Vaping Use   Vaping Use: Never used  Substance Use Topics  Alcohol use: No   Drug use: No      Family History  Problem Relation Age of Onset   Heart disease Brother        CABG & stents   Hyperlipidemia Brother    Hypertension Brother    Cancer Father    Heart disease Mother    Breast cancer Cousin    Kidney disease Neg Hx      Allergies  Allergen Reactions   Clonidine Other (See Comments) and Shortness Of Breath    Other reaction(s): Other (see comments) Other reaction(s): Other (See Comments)   Darvocet [Propoxyphene N-Acetaminophen] Anaphylaxis   Hydralazine     Other reaction(s): Other (See Comments) glomerulonephritis   Hydralazine Hcl Other (See Comments)    glomerulonephritis   Esomeprazole     Other reaction(s): Unknown   Guaifenesin     Other reaction(s): Unknown Other reaction(s): Unknown Other reaction(s): Unknown Other reaction(s): Unknown   Phenylephrine-Guaifenesin     Other reaction(s): Unknown   Rabeprazole     Other reaction(s): Unknown   Telithromycin     Other reaction(s): Unknown Other reaction(s): Unknown Other reaction(s): Unknown   Fluocinolone Other (See Comments)    Feels crazy Other reaction(s): Other (see comments) Other reaction(s): Other (See Comments) Feels crazy Feels crazy   Iodinated Diagnostic Agents Nausea And Vomiting and Other (See Comments)    Burning Other reaction(s): Other (see comments) Other reaction(s): Other (See Comments) Burning Nausea & vomiting   Levaquin [Levofloxacin]  Nausea Only   Statins Other (See Comments)    Myalgia Other reaction(s): Other (See Comments), Unknown, Unknown Myalgia    REVIEW OF SYSTEMS (Negative unless checked)   Constitutional: [] Weight loss  [] Fever  [] Chills Cardiac: [] Chest pain   [] Chest pressure   [] Palpitations   [] Shortness of breath when laying flat   [] Shortness of breath at rest   [x] Shortness of breath with exertion. Vascular:  [] Pain in legs with walking   [] Pain in legs at rest   [] Pain in legs when laying flat   [] Claudication   [] Pain in feet when walking  [] Pain in feet at rest  [] Pain in feet when laying flat   [] History of DVT   [] Phlebitis   [] Swelling in legs   [] Varicose veins   [] Non-healing ulcers Pulmonary:   [x] Uses home oxygen   [] Productive cough   [] Hemoptysis   [] Wheeze  [x] COPD   [] Asthma Neurologic:  [] Dizziness  [] Blackouts   [] Seizures   [x] History of stroke   [] History of TIA  [] Aphasia   [] Temporary blindness   [] Dysphagia   [] Weakness or numbness in arms   [] Weakness or numbness in legs Musculoskeletal:  [x] Arthritis   [] Joint swelling   [x] Joint pain   [] Low back pain Hematologic:  [x] Easy bruising  [] Easy bleeding   [] Hypercoagulable state   [] Anemic  [] Hepatitis Gastrointestinal:  [] Blood in stool   [] Vomiting blood  [x] Gastroesophageal reflux/heartburn   [] Abdominal pain Genitourinary:  [x] Chronic kidney disease   [] Difficult urination  [] Frequent urination  [] Burning with urination   [] Hematuria Skin:  [] Rashes   [] Ulcers   [] Wounds Psychological:  [] History of anxiety   []  History of major depression.  Physical Examination  BP (!) 158/79 (BP Location: Right Arm)   Pulse 84   Resp 16  Gen:  WD/WN, NAD Head: [place]/AT, No temporalis wasting. Ear/Nose/Throat: Hearing grossly intact, nares w/o erythema or drainage Eyes: Conjunctiva clear. Sclera non-icteric Neck: Supple.  Trachea midline Pulmonary:  Good air movement, no use of accessory muscles  on supplemental oxygen.  Cardiac:  Irregular Vascular:  Vessel Right Left  Radial Palpable Palpable                          PT Trace palpable Not palpable  DP 1+ palpable 1+ palpable   Gastrointestinal: soft, non-tender/non-distended. No guarding/reflex.  Musculoskeletal: M/S 5/5 throughout.  No deformity or atrophy.  In a wheelchair.  Irregular ulcer just above the lateral right ankle with fair granulation tissue.  1+ right lower extremity edema, 2-3+ left lower extremity edema. Neurologic: Sensation grossly intact in extremities.  Symmetrical.  Speech is fluent.  Psychiatric: Judgment intact, Mood & affect appropriate for pt's clinical situation. Dermatologic: right ankle ulcer as above      Labs Recent Results (from the past 2160 hour(s))  SARS CORONAVIRUS 2 (TAT 6-24 HRS) Nasopharyngeal Nasopharyngeal Swab     Status: None   Collection Time: 01/11/21 12:34 PM   Specimen: Nasopharyngeal Swab  Result Value Ref Range   SARS Coronavirus 2 NEGATIVE NEGATIVE    Comment: (NOTE) SARS-CoV-2 target nucleic acids are NOT DETECTED.  The SARS-CoV-2 RNA is generally detectable in upper and lower respiratory specimens during the acute phase of infection. Negative results do not preclude SARS-CoV-2 infection, do not rule out co-infections with other pathogens, and should not be used as the sole basis for treatment or other patient management decisions. Negative results must be combined with clinical observations, patient history, and epidemiological information. The expected result is Negative.  Fact Sheet for Patients: SugarRoll.be  Fact Sheet for Healthcare Providers: https://www.woods-mathews.com/  This test is not yet approved or cleared by the [place] FDA and  has been authorized for detection and/or diagnosis of SARS-CoV-2 by FDA under an Emergency Use Authorization (EUA). This EUA will remain  in effect (meaning this test can be used) for the duration of  the COVID-19 declaration under Se ction 564(b)(1) of the Act, 21 U.S.C. section 360bbb-3(b)(1), unless the authorization is terminated or revoked sooner.  Performed at [facility], [place] [address], [place], [place] [address]   Glucose, capillary     Status: None   Collection Time: 01/13/21  8:04 AM  Result Value Ref Range   Glucose-Capillary 95 70 - 99 mg/dL    Comment: Glucose reference range applies only to samples taken after fasting for at least 8 hours.  Potassium [facility] vascular lab only)     Status: None   Collection Time: 01/13/21  8:07 AM  Result Value Ref Range   Potassium [facility] vascular lab) 4.2 3.5 - 5.1    Comment: Performed at [facility], [address] [place], [place] [address]    Radiology VAS [place] (AVF, AVG)  Result Date: 03/15/2021 DIALYSIS ACCESS Patient Name:  REIKO VINJE  Date of Exam:   03/08/2021 Medical Rec #: [id number]          Accession #:    [id number] Date of Birth: October 15, 1933          Patient Gender: F Patient Age:   72Y Exam Location:  [facility] Procedure:      VAS US DUPLEX DIALYSIS ACCESS (AVF, AVG) Referring Phys: [id number] Kris Hartmann --------------------------------------------------------------------------------  Reason for Exam: 4 week follow up. Access Site: Left Upper Extremity. Access Type: Brachial-cephalic AVF. History: 11/04/2020: Left brachial-cephalic AVF creation;          01/13/2021: Left AVF anastomosis PTA;. Performing Technologist: Blondell Reveal RT,  RDMS, RVT  Examination Guidelines: A complete evaluation includes B-mode imaging, spectral Doppler, color Doppler, and power Doppler as needed of all accessible portions of each vessel. Unilateral testing is considered an integral part of a complete examination. Limited examinations for reoccurring indications may be performed as noted.  Findings: +--------------------+----------+-----------------+----------------------------+ AVF                  PSV (cm/s)Flow Vol (mL/min)          Comments           +--------------------+----------+-----------------+----------------------------+ Native artery inflow   232          1096                                    +--------------------+----------+-----------------+----------------------------+ AVF Anastomosis        584                      0.26cm lumen w/o internal                                                           narrowing           +--------------------+----------+-----------------+----------------------------+  +---------------+----------+-------------+----------+--------------------------+ OUTFLOW VEIN   PSV (cm/s)Diameter (cm)Depth (cm)         Describe          +---------------+----------+-------------+----------+--------------------------+ Subclavian vein    77                                                      +---------------+----------+-------------+----------+--------------------------+ Confluence         91                                                      +---------------+----------+-------------+----------+--------------------------+ Clavicle          124                             tortuous segment that                                                    splits and reconnects near                                                         confluence         +---------------+----------+-------------+----------+--------------------------+ Shoulder          100                                                      +---------------+----------+-------------+----------+--------------------------+  Prox UA           149        0.62                                          +---------------+----------+-------------+----------+--------------------------+ Mid UA             83        0.67                                          +---------------+----------+-------------+----------+--------------------------+ Dist UA            109        0.87                                          +---------------+----------+-------------+----------+--------------------------+ AC Fossa          771        0.20                                          +---------------+----------+-------------+----------+--------------------------+  Antegrade, biphasic flow in the left distal radial artery.  Summary: Patent left brachial-cephalic AVF with hemodynamically significant velocity increases near the anastomosis which appear to be due to decreased vessel diameter. Flow Volume in normal. No significant change in velocities near the anastomosis when compared to the previous exam on 02/03/21, however, today's Flow Volume is higher than previously recorded.  *See table(s) above for measurements and observations.  Diagnosing physician: [name] MD Electronically signed by [name] MD on 03/15/2021 at 5:19:00 PM.   --------------------------------------------------------------------------------   Final     Assessment/Plan Diabetes mellitus ([place]) blood glucose control important in reducing the progression of atherosclerotic disease. Also, involved in wound healing. On appropriate medications.     Hyperlipidemia lipid control important in reducing the progression of atherosclerotic disease. Continue statin therapy   Carotid stenosis We performed a carotid duplex recently which correlates much more with her catheter-based angiogram findings and would be in the 40 to 59% range bilaterally.  Particularly given her extensive medical comorbidities, no intervention or surgery would not be recommended at this time.  Continue current medical regimen.  Recheck later this year.   HTN, goal below 140/80 blood pressure control important in reducing the progression of atherosclerotic disease. On appropriate oral medications.  Lower limb ulcer, ankle, right, limited to breakdown of skin (HCC) A 3 layer Unna boot was placed today and will be changed weekly.   Reassess in about 4 weeks.  Swelling of limb Her right leg swelling is better with Unna boots, but her left leg swelling is worse.  Were going to wrap her in Unna boots bilaterally today.    [name], MD  03/29/2021 3:43 PM    This note was created with Dragon medical transcription system.  Any errors from dictation are purely unintentional

## 2021-04-12 ENCOUNTER — Ambulatory Visit (INDEPENDENT_AMBULATORY_CARE_PROVIDER_SITE_OTHER): Payer: Medicare Other | Admitting: Internal Medicine

## 2021-04-12 ENCOUNTER — Other Ambulatory Visit: Payer: Self-pay

## 2021-04-12 ENCOUNTER — Encounter: Payer: Self-pay | Admitting: Internal Medicine

## 2021-04-12 VITALS — BP 120/60 | HR 77 | Temp 97.4°F | Ht 64.0 in | Wt 185.0 lb

## 2021-04-12 DIAGNOSIS — J9611 Chronic respiratory failure with hypoxia: Secondary | ICD-10-CM

## 2021-04-12 DIAGNOSIS — R0689 Other abnormalities of breathing: Secondary | ICD-10-CM | POA: Diagnosis not present

## 2021-04-12 NOTE — Progress Notes (Signed)
[facility] Consultation      Date: 04/12/2021,   MRN# [id number] [name] 06-Jun-1934 Code Status:  Code Status History     Date Active Date Inactive Code Status Order ID Comments User Context   08/04/2020 0535 08/11/2020 1917 Full Code [id number]  Athena Masse, MD ED   06/22/2020 1908 07/02/2020 1918 Full Code [id number]  Gwynne Edinger, MD ED   05/04/2016 1336 05/09/2016 1740 Full Code [id number]  Lytle Butte, MD Inpatient   04/28/2016 0157 05/02/2016 1656 Full Code [id number]  Harrie Foreman, MD ED   07/17/2015 1111 07/20/2015 1432 Full Code [id number]  Bettey Costa, MD Inpatient   09/10/2012 1956 09/23/2012 1532 Full Code [id number]  Lorraine Lax, RN Inpatient   09/09/2012 0817 09/10/2012 1956 Full Code [id number]  Rexene Alberts, MD Inpatient   09/05/2012 1324 09/09/2012 0817 Full Code [id number]  Edwyna Perfect, RN Inpatient      Questions for Most Recent Historical Code Status (Order [id number])           CHIEF COMPLAINT:  Shortness of breath    HISTORY OF PRESENT ILLNESS   87 year old pleasant white female seen today for shortness of breath with exertion Patient had a recent surgery done at the hospital and noted to have hypoxia and was placed on oxygen Patient was sent home on oxygen therapy Patient is a non-smoker Patient has multiple comorbidities including cardiomegaly with previous history of open heart surgery followed by Dr. Rockey Situ with cardiology Patient has end-stage renal disease with dialysis on Mondays Wednesdays and Fridays  At this time patient has very poor respiratory effort She has very poor respiratory insufficiency  I have explained to her that she has multiple comorbidities along with her advanced age which restricts her respiratory status  At this time there is no evidence of acute heart failure Patient does have chronic heart failure and cardiomegaly with lower extremity edema  Patient feels better with  oxygen therapy Patient is unable to walk without assistance therefore unable to obtain a 6-minute walk test Patient will need overnight pulse oximetry to assess for nocturnal hypoxia  No exacerbation at this time No evidence of heart failure at this time No evidence or signs of infection at this time No respiratory distress No fevers, chills, nausea, vomiting, diarrhea + edema No evidence hemoptysis    PAST MEDICAL HISTORY   Past Medical History:  Diagnosis Date   Arthritis    Bilateral Macular degeneration    Breast cancer ([place]) 2012   right breast   Breast mass, right    CAD S/P CABG x 4    a. 09/2012 CABG x 4: LIMA to LAD, SVG to Diag, SVG to OM1, SVG to PDA, EVH from bilateral thighs   Cancer ([place])    breast cancer, right side 5102   Complication of anesthesia    Coronary artery disease 08/22/2012   sees Dr Rockey Situ   Diabetes [facility])    Diastolic dysfunction    a. 06/2020 Echo: EF 60-65%; b. 08/2020 Echo: EF 60-65%, no rwma, Gr1 DD. Nl RV size/fxn.   DOE (dyspnea on exertion)    ESRD (end stage renal disease) ([place])    Gastritis    hx of   GERD (gastroesophageal reflux disease)    Headache(784.0)    migraines   History of breast cancer    39 treatments of radiation. Negative chemo.   History of seasonal allergies    Hyperlipemia    Hypertension  sees Dr. Fulton Reek   Hypothyroidism    Neuropathy    Personal history of radiation therapy    Pneumonia    hx of   PONV (postoperative nausea and vomiting)    Stroke [facility])    2012   Vertigo      SURGICAL HISTORY   Past Surgical History:  Procedure Laterality Date   A/V FISTULAGRAM Left 01/13/2021   Procedure: A/V FISTULAGRAM;  Surgeon: Algernon Huxley, MD;  Location: [place] [facility];  Service: Cardiovascular;  Laterality: Left;   ABDOMINAL HYSTERECTOMY     APPENDECTOMY     AV FISTULA PLACEMENT Left 11/04/2020   Procedure: ARTERIOVENOUS (AV) FISTULA CREATION (BRACHIOCEPHALIC);  Surgeon: Algernon Huxley, MD;   Location: [facility] OR[facility];  Service: Vascular;  Laterality: Left;   BACK SURGERY     BREAST BIOPSY Right    2012 positive- [facility]   BREAST LUMPECTOMY Right 2012   F/U radiation    BREAST SURGERY     CARDIAC CATHETERIZATION  2014   CAROTID PTA/STENT INTERVENTION Left 06/14/2020   Procedure: CAROTID PTA/STENT INTERVENTION;  Surgeon: Algernon Huxley, MD;  Location: [place] [facility];  Service: Cardiovascular;  Laterality: Left;   CATARACT EXTRACTION W/PHACO Right 08/14/2017   Procedure: CATARACT EXTRACTION PHACO AND INTRAOCULAR LENS PLACEMENT (IOC);  Surgeon: Birder Robson, MD;  Location: [facility] OR[facility];  Service: Ophthalmology;  Laterality: Right;  [place] 00:43.0 AP% 17.1 CDE 7.37 Fluid Pack lot # [id number]   CATARACT EXTRACTION W/PHACO Left 10/09/2017   Procedure: CATARACT EXTRACTION PHACO AND INTRAOCULAR LENS PLACEMENT (IOC);  Surgeon: Birder Robson, MD;  Location: [facility];  Service: Ophthalmology;  Laterality: Left;  [place] 00:30.3 AP% 11.0 CDE 3.33 Fluid Pack Lot # [id number]   CORONARY ARTERY BYPASS GRAFT  09/05/2012   Procedure: CORONARY ARTERY BYPASS GRAFTING (CABG);  Surgeon: Rexene Alberts, MD;  Location: [place];  Service: Open Heart Surgery;  Laterality: N/A;  CABG x four, using left internal mammary artery and bilateral greater saphenous vein harvested endoscopically   DIALYSIS/PERMA CATHETER INSERTION Left 06/25/2020   Procedure: DIALYSIS/PERMA CATHETER INSERTION;  Surgeon: Algernon Huxley, MD;  Location: [place] [facility];  Service: Cardiovascular;  Laterality: Left;   DIALYSIS/PERMA CATHETER INSERTION N/A 08/10/2020   Procedure: DIALYSIS/PERMA CATHETER INSERTION;  Surgeon: Katha Cabal, MD;  Location: [place] [facility];  Service: Cardiovascular;  Laterality: N/A;   DIALYSIS/PERMA CATHETER REMOVAL N/A 08/05/2020   Procedure: DIALYSIS/PERMA CATHETER REMOVAL;  Surgeon: Algernon Huxley, MD;  Location: [place] [facility];  Service: Cardiovascular;  Laterality: N/A;   INTRAOPERATIVE TRANSESOPHAGEAL  ECHOCARDIOGRAM  09/05/2012   Procedure: INTRAOPERATIVE TRANSESOPHAGEAL ECHOCARDIOGRAM;  Surgeon: Rexene Alberts, MD;  Location: [place];  Service: Open Heart Surgery;  Laterality: N/A;   KNEE SURGERY     Bilateral nerve block   LAPAROSCOPIC NISSEN FUNDOPLICATION     OVARIAN CYST REMOVAL       FAMILY HISTORY   Family History  Problem Relation Age of Onset   Heart disease Brother        CABG & stents   Hyperlipidemia Brother    Hypertension Brother    Cancer Father    Heart disease Mother    Breast cancer Cousin    Kidney disease Neg Hx      SOCIAL HISTORY   Social History   Tobacco Use   Smoking status: Former    Pack years: 0.00    Types: Cigarettes    Quit date: 11/13/1988  Years since quitting: 32.4   Smokeless tobacco: Never  Vaping Use   Vaping Use: Never used  Substance Use Topics   Alcohol use: No   Drug use: No     MEDICATIONS    Home Medication:  Current Outpatient Rx   Order #: [id number] Class: Historical Med   Order #: [id number] Class: Historical Med   Order #: [id number] Class: Historical Med   Order #: [id number] Class: Historical Med   Order #: [id number] Class: Historical Med   Order #: [id number] Class: Historical Med   Order #: [id number] Class: Historical Med   Order #: [id number] Class: Historical Med   Order #: [id number] Class: Historical Med   Order #: [id number] Class: Historical Med   Order #: [id number] Class: Normal   Order #: [id number] Class: Historical Med   Order #: [id number] Class: Historical Med   Order #: [id number] Class: Historical Med   Order #: [id number] Class: Normal    Current Medication:  Current Outpatient Medications:    ALPRAZolam (XANAX) 0.5 MG tablet, Take 0.5 mg by mouth 2 (two) times daily as needed for anxiety. , Disp: , Rfl:    amoxicillin (AMOXIL) 875 MG tablet, Take 875 mg by mouth 2 (two) times daily., Disp: , Rfl:    aspirin 81 MG chewable tablet, Chew 81 mg by mouth every Monday, Wednesday, and Friday. , Disp: , Rfl:     calcium-vitamin D (OSCAL WITH D) 500-200 MG-UNIT tablet, Take 1 tablet by mouth daily., Disp: , Rfl:    cetirizine (ZYRTEC) 10 MG tablet, Take 10 mg by mouth daily., Disp: , Rfl:    clopidogrel (PLAVIX) 75 MG tablet, Take 75 mg by mouth daily., Disp: , Rfl:    Cyanocobalamin 1000 MCG SUBL, Take 1,000 mcg by mouth daily. , Disp: , Rfl:    gabapentin (NEURONTIN) 100 MG capsule, Take 100 mg by mouth 2 (two) times daily., Disp: , Rfl:    glimepiride (AMARYL) 4 MG tablet, Take 4 mg by mouth daily with breakfast., Disp: , Rfl:    hydrALAZINE (APRESOLINE) 25 MG tablet, Take 25 mg by mouth 3 (three) times daily., Disp: , Rfl:    HYDROcodone-acetaminophen (NORCO/VICODIN) 5-325 MG tablet, Take 1 tablet by mouth every 6 (six) hours as needed for moderate pain., Disp: 30 tablet, Rfl: 0   levothyroxine (SYNTHROID, LEVOTHROID) 150 MCG tablet, Take 150 mcg daily before breakfast by mouth., Disp: , Rfl:    metoprolol tartrate (LOPRESSOR) 50 MG tablet, Take 50 mg by mouth in the morning, at noon, and at bedtime., Disp: , Rfl:    OXYGEN, Inhale 2 L into the lungs daily., Disp: , Rfl:    torsemide (DEMADEX) 100 MG tablet, Take 1 tablet (100 mg total) by mouth 3 (three) times a week. Tuesday, Thursday, & Saturday (non dialysis days), Disp: 50 tablet, Rfl: 3    ALLERGIES   Clonidine, Darvocet [propoxyphene n-acetaminophen], Hydralazine, Hydralazine hcl, Esomeprazole, Guaifenesin, Phenylephrine-guaifenesin, Rabeprazole, Telithromycin, Fluocinolone, Iodinated diagnostic agents, Levaquin [levofloxacin], and Statins     REVIEW OF SYSTEMS    Review of Systems:  Gen:  Denies  fever, sweats, chills weigh loss  HEENT: Denies blurred vision, double vision, ear pain, eye pain, hearing loss, nose bleeds, sore throat Cardiac:  No dizziness, chest pain or heaviness, chest tightness,edema Resp:   Denies cough or sputum porduction, +shortness of breath,wheezing, hemoptysis,  Gi: Denies swallowing difficulty, stomach  pain, nausea or vomiting, diarrhea, constipation, bowel incontinence Gu:  Denies bladder incontinence, burning urine Ext:   Denies Joint pain, stiffness or swelling Skin: Denies  skin rash, easy bruising  or bleeding or hives Endoc:  Denies polyuria, polydipsia , polyphagia or weight change Psych:   Denies depression, insomnia or hallucinations   Other:  All other systems negative   BP 120/60 (BP Location: Right Arm, Patient Position: Sitting, Cuff Size: Large)   Pulse 77   Temp (!) 97.4 F (36.3 C) (Oral)   Ht 5\' 4"  (1.626 m)   Wt 185 lb (83.9 kg)   SpO2 90%   BMI 31.76 kg/m    PHYSICAL EXAM  Physical Examination:   GENERAL:NAD, no fevers, chills, no weakness no fatigue HEAD: Normocephalic, atraumatic.  EYES: Pupils equal, round, reactive to light. Extraocular muscles intact. No scleral icterus.  MOUTH: Moist mucosal membrane. Dentition intact. No abscess noted.  EAR, NOSE, THROAT: Clear without exudates. No external lesions.  NECK: Supple. No thyromegaly. No nodules. No JVD.  PULMONARY: Diffuse coarse rhonchi right sided  CARDIOVASCULAR: S1 and S2. Regular rate and rhythm. No murmurs, rubs, or gallops. No edema. Pedal pulses 2+ bilaterally.  GASTROINTESTINAL: Soft, nontender, nondistended. No masses. Positive bowel sounds. No hepatosplenomegaly.  MUSCULOSKELETAL: +edema. Range of motion full in all extremities.  NEUROLOGIC: Cranial nerves II through XII are intact. No gross focal neurological deficits. Sensation intact. Reflexes intact.  SKIN: No ulceration, lesions, rashes, or cyanosis. Skin warm and dry. Turgor intact.  PSYCHIATRIC: Mood, affect within normal limits. The patient is awake, alert and oriented x 3. Insight, judgment intact.  ALL OTHER ROS ARE NEGATIVE     IMAGING   ECHO 08/2020 Left ventricular ejection fraction, by estimation, is 60 to 65%. The  left ventricle has normal function. The left ventricle has no regional  wall motion abnormalities. Left  ventricular diastolic parameters are  consistent with Grade I diastolic  dysfunction (impaired relaxation).  CXR 12/2020 The CXR was Independently Reviewed By Me Today CXR reviewed-pulm edema, cardiomegaly   ASSESSMENT/PLAN   87 year old pleasant white female with multiple comorbidities including cardiomegaly with diastolic heart failure and cor pulmonale in the setting of end-stage renal disease on dialysis with very poor respiratory effort and very poor respiratory insufficiency with advanced age  At this time I recommend continuing oxygen therapy at rest her O2 sats is 90% Will consider obtaining pulse oximetry after physical therapy assessment Recommend overnight pulse oximetry for assessment of nocturnal hypoxia  At this time I have encouraged patient to get sleep study to rule out sleep apnea however patient refuses this test at this time  No evidence of exacerbation at this time Continue oxygen 2 L nasal cannula 24/7        MEDICATION ADJUSTMENTS/LABS AND TESTS ORDERED: Continue oxygen as prescribed   CURRENT MEDICATIONS REVIEWED AT LENGTH WITH PATIENT TODAY   Patient  satisfied with Plan of action and management. All questions answered  Follow up 6 months  Total Time Spent 55 minutes   Corrin Parker, M.D.  [facility]  Medical Director [place] Director [facility] Cardio-Pulmonary Department

## 2021-04-12 NOTE — Patient Instructions (Signed)
Recommend obtaining pulse oximetry after complete for physical therapy Recommend Overnight Pulse oximetry   RECOMMEND CONTINUATION OF OXYGEN THERAPY 2L [place] 24/7

## 2021-04-12 NOTE — Addendum Note (Signed)
Addended by: Vanessa Barbara on: 04/12/2021 12:31 PM   Modules accepted: Orders

## 2021-04-25 ENCOUNTER — Telehealth (INDEPENDENT_AMBULATORY_CARE_PROVIDER_SITE_OTHER): Payer: Self-pay

## 2021-04-25 NOTE — Telephone Encounter (Signed)
That's fine

## 2021-04-25 NOTE — Telephone Encounter (Signed)
The PT from [facility] called and left a VM on the nurses line wanting to know if she could have a verbal order for 1 x a week for 3 weeks for strength and activity , tolerance, endurance, and gate training.  Please advise.

## 2021-04-26 ENCOUNTER — Ambulatory Visit (INDEPENDENT_AMBULATORY_CARE_PROVIDER_SITE_OTHER): Payer: Medicare Other | Admitting: Nurse Practitioner

## 2021-04-26 ENCOUNTER — Other Ambulatory Visit: Payer: Self-pay

## 2021-04-26 VITALS — BP 138/73 | HR 76 | Ht 64.0 in | Wt 185.0 lb

## 2021-04-26 DIAGNOSIS — I10 Essential (primary) hypertension: Secondary | ICD-10-CM | POA: Diagnosis not present

## 2021-04-26 DIAGNOSIS — L97311 Non-pressure chronic ulcer of right ankle limited to breakdown of skin: Secondary | ICD-10-CM | POA: Diagnosis not present

## 2021-04-26 DIAGNOSIS — Z992 Dependence on renal dialysis: Secondary | ICD-10-CM

## 2021-04-26 DIAGNOSIS — N186 End stage renal disease: Secondary | ICD-10-CM | POA: Diagnosis not present

## 2021-04-26 NOTE — Telephone Encounter (Signed)
I called and made [name] the PT aware of the approval of the verbal order.

## 2021-05-09 ENCOUNTER — Telehealth (INDEPENDENT_AMBULATORY_CARE_PROVIDER_SITE_OTHER): Payer: Self-pay | Admitting: Vascular Surgery

## 2021-05-09 ENCOUNTER — Encounter (INDEPENDENT_AMBULATORY_CARE_PROVIDER_SITE_OTHER): Payer: Self-pay | Admitting: Nurse Practitioner

## 2021-05-09 NOTE — Progress Notes (Signed)
Subjective:    Patient ID: [name], female    DOB: June 28, 1934, 87 y.o.   MRN: [id number] Chief Complaint  Patient presents with  . Follow-up    4 weeks RLE unna boot check    The patient returns to the office for followup evaluation regarding leg swelling.  The swelling has improved quite a bit and the pain associated with swelling has decreased substantially. There have not been any interval development of a ulcerations or wounds.  Since the previous visit the patient has been wearing Unna boots and has noted significant improvement in the lymphedema. The patient has been using compression routinely morning until night.  The patient is not able to be very active.  She is also on dialysis.   Review of Systems  Cardiovascular:  Negative for leg swelling.  Neurological:  Positive for weakness.  All other systems reviewed and are negative.     Objective:   Physical Exam Vitals reviewed.  HENT:     Head: Normocephalic.  Cardiovascular:     Rate and Rhythm: Normal rate.     Pulses: Normal pulses.          Radial pulses are 2+ on the right side and 2+ on the left side.     Comments: Good thrill and bruit Pulmonary:     Effort: Pulmonary effort is normal.  Neurological:     Mental Status: She is alert and oriented to person, place, and time.  Psychiatric:        Mood and Affect: Mood normal.        Behavior: Behavior normal.        Thought Content: Thought content normal.        Judgment: Judgment normal.    BP 138/73   Pulse 76   Ht 5\' 4"  (1.626 m)   Wt 185 lb (83.9 kg)   BMI 31.76 kg/m   Past Medical History:  Diagnosis Date  . Arthritis   . Bilateral Macular degeneration   . Breast cancer ([place]) 2012   right breast  . Breast mass, right   . CAD S/P CABG x 4    a. 09/2012 CABG x 4: LIMA to LAD, SVG to Diag, SVG to OM1, SVG to PDA, EVH from bilateral thighs  . Cancer [facility])    breast cancer, right side 2012  . Complication of anesthesia   . Coronary  artery disease 08/22/2012   sees Dr Rockey Situ  . Diabetes ([place])   . Diastolic dysfunction    a. 06/2020 Echo: EF 60-65%; b. 08/2020 Echo: EF 60-65%, no rwma, Gr1 DD. Nl RV size/fxn.  . DOE (dyspnea on exertion)   . ESRD (end stage renal disease) ([place])   . Gastritis    hx of  . GERD (gastroesophageal reflux disease)   . Headache(784.0)    migraines  . History of breast cancer    39 treatments of radiation. Negative chemo.  Marland Kitchen History of seasonal allergies   . Hyperlipemia   . Hypertension    sees Dr. Fulton Reek  . Hypothyroidism   . Neuropathy   . Personal history of radiation therapy   . Pneumonia    hx of  . PONV (postoperative nausea and vomiting)   . Stroke ([place])    2012  . Vertigo     Social History   Socioeconomic History  . Marital status: Married    Spouse name: [name] "Bud"  . Number of children: 0  . Years of  education: Not on file  . Highest education level: Not on file  Occupational History  . Occupation: retired  Tobacco Use  . Smoking status: Former    Years: 3.00    Types: Cigarettes    Quit date: 11/13/1988    Years since quitting: 32.5  . Smokeless tobacco: Never  . Tobacco comments:    At the most 1 pack would last over a week.  Vaping Use  . Vaping Use: Never used  Substance and Sexual Activity  . Alcohol use: No  . Drug use: No  . Sexual activity: Not on file  Other Topics Concern  . Not on file  Social History Narrative   Lives at home with spouse    Social Determinants of Health   Financial Resource Strain: Not on file  Food Insecurity: Not on file  Transportation Needs: Not on file  Physical Activity: Not on file  Stress: Not on file  Social Connections: Not on file  Intimate Partner Violence: Not on file    Past Surgical History:  Procedure Laterality Date  . A/V FISTULAGRAM Left 01/13/2021   Procedure: A/V FISTULAGRAM;  Surgeon: Algernon Huxley, MD;  Location: [place] [facility];  Service: Cardiovascular;  Laterality: Left;   . ABDOMINAL HYSTERECTOMY    . APPENDECTOMY    . AV FISTULA PLACEMENT Left 11/04/2020   Procedure: ARTERIOVENOUS (AV) FISTULA CREATION (BRACHIOCEPHALIC);  Surgeon: Algernon Huxley, MD;  Location: [facility];  Service: Vascular;  Laterality: Left;  . BACK SURGERY    . BREAST BIOPSY Right    2012 positive- [facility]  . BREAST LUMPECTOMY Right 2012   F/U radiation   . BREAST SURGERY    . CARDIAC CATHETERIZATION  2014  . CAROTID PTA/STENT INTERVENTION Left 06/14/2020   Procedure: CAROTID PTA/STENT INTERVENTION;  Surgeon: Algernon Huxley, MD;  Location: [place] [facility];  Service: Cardiovascular;  Laterality: Left;  . CATARACT EXTRACTION W/PHACO Right 08/14/2017   Procedure: CATARACT EXTRACTION PHACO AND INTRAOCULAR LENS PLACEMENT ([facility]);  Surgeon: Birder Robson, MD;  Location: [facility];  Service: Ophthalmology;  Laterality: Right;  [place] 00:43.0 AP% 17.1 CDE 7.37 Fluid Pack lot # [id number]  . CATARACT EXTRACTION W/PHACO Left 10/09/2017   Procedure: CATARACT EXTRACTION PHACO AND INTRAOCULAR LENS PLACEMENT ([place]);  Surgeon: Birder Robson, MD;  Location: [facility];  Service: Ophthalmology;  Laterality: Left;  [place] 00:30.3 AP% 11.0 CDE 3.33 Fluid Pack Lot # [id number]  . CORONARY ARTERY BYPASS GRAFT  09/05/2012   Procedure: CORONARY ARTERY BYPASS GRAFTING (CABG);  Surgeon: Rexene Alberts, MD;  Location: [place];  Service: Open Heart Surgery;  Laterality: N/A;  CABG x four, using left internal mammary artery and bilateral greater saphenous vein harvested endoscopically  . DIALYSIS/PERMA CATHETER INSERTION Left 06/25/2020   Procedure: DIALYSIS/PERMA CATHETER INSERTION;  Surgeon: Algernon Huxley, MD;  Location: [place] [facility];  Service: Cardiovascular;  Laterality: Left;  . DIALYSIS/PERMA CATHETER INSERTION N/A 08/10/2020   Procedure: DIALYSIS/PERMA CATHETER INSERTION;  Surgeon: Katha Cabal, MD;  Location: [place] [facility];  Service: Cardiovascular;  Laterality: N/A;  . DIALYSIS/PERMA CATHETER REMOVAL N/A  08/05/2020   Procedure: DIALYSIS/PERMA CATHETER REMOVAL;  Surgeon: Algernon Huxley, MD;  Location: [place] [facility];  Service: Cardiovascular;  Laterality: N/A;  . INTRAOPERATIVE TRANSESOPHAGEAL ECHOCARDIOGRAM  09/05/2012   Procedure: INTRAOPERATIVE TRANSESOPHAGEAL ECHOCARDIOGRAM;  Surgeon: Rexene Alberts, MD;  Location: [place];  Service: Open Heart Surgery;  Laterality: N/A;  . KNEE SURGERY  Bilateral nerve block  . LAPAROSCOPIC NISSEN FUNDOPLICATION    . OVARIAN CYST REMOVAL      Family History  Problem Relation Age of Onset  . Heart disease Brother        CABG & stents  . Hyperlipidemia Brother   . Hypertension Brother   . Cancer Father   . Heart disease Mother   . Breast cancer Cousin   . Kidney disease Neg Hx     Allergies  Allergen Reactions  . Clonidine Other (See Comments) and Shortness Of Breath    Other reaction(s): Other (see comments) Other reaction(s): Other (See Comments)  . Darvocet [Propoxyphene N-Acetaminophen] Anaphylaxis  . Hydralazine     Other reaction(s): Other (See Comments) glomerulonephritis  . Hydralazine Hcl Other (See Comments)    glomerulonephritis  . Esomeprazole     Other reaction(s): Unknown  . Guaifenesin     Other reaction(s): Unknown Other reaction(s): Unknown Other reaction(s): Unknown Other reaction(s): Unknown  . Phenylephrine-Guaifenesin     Other reaction(s): Unknown  . Rabeprazole     Other reaction(s): Unknown  . Telithromycin     Other reaction(s): Unknown Other reaction(s): Unknown Other reaction(s): Unknown  . Fluocinolone Other (See Comments)    Feels crazy Other reaction(s): Other (see comments) Other reaction(s): Other (See Comments) Feels crazy Feels crazy  . Iodinated Diagnostic Agents Nausea And Vomiting and Other (See Comments)    Burning Other reaction(s): Other (see comments) Other reaction(s): Other (See Comments) Burning Nausea & vomiting  . Levaquin [Levofloxacin] Nausea Only  . Statins Other (See  Comments)    Myalgia Other reaction(s): Other (See Comments), Unknown, Unknown Myalgia    CBC Latest Ref Rng & Units 11/04/2020 08/07/2020 08/06/2020  WBC 4.0 - 10.5 K/uL - 4.7 -  Hemoglobin 12.0 - 15.0 g/dL 13.9 7.4(L) 7.3(L)  Hematocrit 36.0 - 46.0 % 41.0 23.9(L) -  Platelets 150 - 400 K/uL - 92(L) -      CMP     Component Value Date/Time   NA 137 11/04/2020 1208   NA 140 11/22/2015 0830   NA 142 05/07/2014 0516   K 5.5 (H) 11/04/2020 1208   K 3.2 (L) 05/07/2014 0516   CL 103 11/04/2020 1208   CL 104 05/07/2014 0516   CO2 27 08/10/2020 0418   CO2 29 05/07/2014 0516   GLUCOSE 68 (L) 11/04/2020 1208   GLUCOSE 64 (L) 05/07/2014 0516   BUN 36 (H) 11/04/2020 1208   BUN 35 (H) 11/22/2015 0830   BUN 26 (H) 05/07/2014 0516   CREATININE 4.90 (H) 11/04/2020 1208   CREATININE 1.13 05/07/2014 0516   CALCIUM 6.9 (L) 08/10/2020 0418   CALCIUM 7.9 (L) 05/07/2014 0516   PROT 6.5 08/03/2020 1557   PROT 8.0 05/03/2014 0451   ALBUMIN 2.2 (L) 08/05/2020 0817   ALBUMIN 2.9 (L) 05/03/2014 0451   AST 12 (L) 08/03/2020 1557   AST 25 05/03/2014 0451   ALT 7 08/03/2020 1557   ALT 19 05/03/2014 0451   ALKPHOS 68 08/03/2020 1557   ALKPHOS 109 05/03/2014 0451   BILITOT 0.8 08/03/2020 1557   BILITOT 0.3 05/03/2014 0451   GFRNONAA 6 (L) 08/10/2020 0418   GFRNONAA 47 (L) 05/07/2014 0516   GFRAA 6 (L) 06/30/2020 0611   GFRAA 54 (L) 05/07/2014 0516     No results found.     Assessment & Plan:   1. Lower limb ulcer, ankle, right, limited to breakdown of skin ([place]) Today the patient's wounds are healed.  The  swelling is also greatly controlled.  The patient is advised to continue with conservative therapy including medical grade compression stockings, elevation and activity if possible.  Stockings should be worn daily and place first thing in the morning and is specifically told not to sleep in.  Patient should be elevating her legs when she is not active.  Patient is advised to contact the  office if she begins to have severe swelling ulcerations again.  2. ESRD (end stage renal disease) on dialysis [facility]) Recommend:  The patient is doing well and currently has adequate dialysis access.  The patient has been cleared by our office to begin using her dialysis access at her last office visit with Dr. Lucky Cowboy The patient's dialysis center is not reporting any access issues. Flow pattern is stable when compared to the prior ultrasound.  The patient should have a duplex ultrasound of the dialysis access in 6 months. The patient will follow-up with me in the office after each ultrasound     3. Primary hypertension Continue antihypertensive medications as already ordered, these medications have been reviewed and there are no changes at this time.    Current Outpatient Medications on File Prior to Visit  Medication Sig Dispense Refill  . ALPRAZolam (XANAX) 0.5 MG tablet Take 0.5 mg by mouth 2 (two) times daily as needed for anxiety.     Marland Kitchen aspirin 81 MG chewable tablet Chew 81 mg by mouth every Monday, Wednesday, and Friday.     . calcium-vitamin D (OSCAL WITH D) 500-200 MG-UNIT tablet Take 1 tablet by mouth daily.    . Cyanocobalamin 1000 MCG SUBL Take 1,000 mcg by mouth daily.     . furosemide (LASIX) 20 MG tablet Take 20 mg by mouth daily.    Marland Kitchen gabapentin (NEURONTIN) 100 MG capsule Take 100 mg by mouth 2 (two) times daily.    Marland Kitchen gabapentin (NEURONTIN) 100 MG capsule Take 1 capsule by mouth 2 (two) times daily.    Marland Kitchen glimepiride (AMARYL) 4 MG tablet Take 4 mg by mouth daily with breakfast.    . hydrALAZINE (APRESOLINE) 25 MG tablet Take 25 mg by mouth 3 (three) times daily.    Marland Kitchen HYDROcodone-acetaminophen (NORCO/VICODIN) 5-325 MG tablet Take 1 tablet by mouth every 6 (six) hours as needed for moderate pain. 30 tablet 0  . levothyroxine (SYNTHROID) 150 MCG tablet Take by mouth.    . levothyroxine (SYNTHROID, LEVOTHROID) 150 MCG tablet Take 150 mcg daily before breakfast by mouth.    .  metoprolol tartrate (LOPRESSOR) 50 MG tablet Take 50 mg by mouth in the morning, at noon, and at bedtime.    . OXYGEN Inhale 2 L into the lungs daily.    Marland Kitchen sulfamethoxazole-trimethoprim (BACTRIM DS) 800-160 MG tablet Take 1 tablet by mouth 2 (two) times daily.     No current facility-administered medications on file prior to visit.    There are no Patient Instructions on file for this visit. No follow-ups on file.   Kris Hartmann, NP

## 2021-05-09 NOTE — Telephone Encounter (Signed)
Patient resch to 8/30 at 7:30 am, tried to offer something sooner with NP, but refused.

## 2021-05-09 NOTE — H&P (View-Only) (Signed)
Subjective:    Patient ID: [name], female    DOB: 26-Dec-1933, 87 y.o.   MRN: [id number] Chief Complaint  Patient presents with  . Follow-up    4 weeks RLE unna boot check    The patient returns to the office for followup evaluation regarding leg swelling.  The swelling has improved quite a bit and the pain associated with swelling has decreased substantially. There have not been any interval development of a ulcerations or wounds.  Since the previous visit the patient has been wearing Unna boots and has noted significant improvement in the lymphedema. The patient has been using compression routinely morning until night.  The patient is not able to be very active.  She is also on dialysis.   Review of Systems  Cardiovascular:  Negative for leg swelling.  Neurological:  Positive for weakness.  All other systems reviewed and are negative.     Objective:   Physical Exam Vitals reviewed.  HENT:     Head: Normocephalic.  Cardiovascular:     Rate and Rhythm: Normal rate.     Pulses: Normal pulses.          Radial pulses are 2+ on the right side and 2+ on the left side.     Comments: Good thrill and bruit Pulmonary:     Effort: Pulmonary effort is normal.  Neurological:     Mental Status: She is alert and oriented to person, place, and time.  Psychiatric:        Mood and Affect: Mood normal.        Behavior: Behavior normal.        Thought Content: Thought content normal.        Judgment: Judgment normal.    BP 138/73   Pulse 76   Ht 5\' 4"  (1.626 m)   Wt 185 lb (83.9 kg)   BMI 31.76 kg/m   Past Medical History:  Diagnosis Date  . Arthritis   . Bilateral Macular degeneration   . Breast cancer ([place]) 2012   right breast  . Breast mass, right   . CAD S/P CABG x 4    a. 09/2012 CABG x 4: LIMA to LAD, SVG to Diag, SVG to OM1, SVG to PDA, EVH from bilateral thighs  . Cancer [facility])    breast cancer, right side 2012  . Complication of anesthesia   . Coronary  artery disease 08/22/2012   sees Dr Rockey Situ  . Diabetes ([place])   . Diastolic dysfunction    a. 06/2020 Echo: EF 60-65%; b. 08/2020 Echo: EF 60-65%, no rwma, Gr1 DD. Nl RV size/fxn.  . DOE (dyspnea on exertion)   . ESRD (end stage renal disease) ([place])   . Gastritis    hx of  . GERD (gastroesophageal reflux disease)   . Headache(784.0)    migraines  . History of breast cancer    39 treatments of radiation. Negative chemo.  Marland Kitchen History of seasonal allergies   . Hyperlipemia   . Hypertension    sees Dr. Fulton Reek  . Hypothyroidism   . Neuropathy   . Personal history of radiation therapy   . Pneumonia    hx of  . PONV (postoperative nausea and vomiting)   . Stroke ([place])    2012  . Vertigo     Social History   Socioeconomic History  . Marital status: Married    Spouse name: [name] "Bud"  . Number of children: 0  . Years of  education: Not on file  . Highest education level: Not on file  Occupational History  . Occupation: retired  Tobacco Use  . Smoking status: Former    Years: 3.00    Types: Cigarettes    Quit date: 11/13/1988    Years since quitting: 32.5  . Smokeless tobacco: Never  . Tobacco comments:    At the most 1 pack would last over a week.  Vaping Use  . Vaping Use: Never used  Substance and Sexual Activity  . Alcohol use: No  . Drug use: No  . Sexual activity: Not on file  Other Topics Concern  . Not on file  Social History Narrative   Lives at home with spouse    Social Determinants of Health   Financial Resource Strain: Not on file  Food Insecurity: Not on file  Transportation Needs: Not on file  Physical Activity: Not on file  Stress: Not on file  Social Connections: Not on file  Intimate Partner Violence: Not on file    Past Surgical History:  Procedure Laterality Date  . A/V FISTULAGRAM Left 01/13/2021   Procedure: A/V FISTULAGRAM;  Surgeon: Algernon Huxley, MD;  Location: [place] [facility];  Service: Cardiovascular;  Laterality: Left;   . ABDOMINAL HYSTERECTOMY    . APPENDECTOMY    . AV FISTULA PLACEMENT Left 11/04/2020   Procedure: ARTERIOVENOUS (AV) FISTULA CREATION (BRACHIOCEPHALIC);  Surgeon: Algernon Huxley, MD;  Location: [facility];  Service: Vascular;  Laterality: Left;  . BACK SURGERY    . BREAST BIOPSY Right    2012 positive- [facility]  . BREAST LUMPECTOMY Right 2012   F/U radiation   . BREAST SURGERY    . CARDIAC CATHETERIZATION  2014  . CAROTID PTA/STENT INTERVENTION Left 06/14/2020   Procedure: CAROTID PTA/STENT INTERVENTION;  Surgeon: Algernon Huxley, MD;  Location: [place] [facility];  Service: Cardiovascular;  Laterality: Left;  . CATARACT EXTRACTION W/PHACO Right 08/14/2017   Procedure: CATARACT EXTRACTION PHACO AND INTRAOCULAR LENS PLACEMENT (I[facility]);  Surgeon: Birder Robson, MD;  Location: [facility];  Service: Ophthalmology;  Laterality: Right;  [place] 00:43.0 AP% 17.1 CDE 7.37 Fluid Pack lot # [id number]  . CATARACT EXTRACTION W/PHACO Left 10/09/2017   Procedure: CATARACT EXTRACTION PHACO AND INTRAOCULAR LENS PLACEMENT ([place]);  Surgeon: Birder Robson, MD;  Location: [facility];  Service: Ophthalmology;  Laterality: Left;  [place] 00:30.3 AP% 11.0 CDE 3.33 Fluid Pack Lot # [id number]  . CORONARY ARTERY BYPASS GRAFT  09/05/2012   Procedure: CORONARY ARTERY BYPASS GRAFTING (CABG);  Surgeon: Rexene Alberts, MD;  Location: [place];  Service: Open Heart Surgery;  Laterality: N/A;  CABG x four, using left internal mammary artery and bilateral greater saphenous vein harvested endoscopically  . DIALYSIS/PERMA CATHETER INSERTION Left 06/25/2020   Procedure: DIALYSIS/PERMA CATHETER INSERTION;  Surgeon: Algernon Huxley, MD;  Location: [place] [facility];  Service: Cardiovascular;  Laterality: Left;  . DIALYSIS/PERMA CATHETER INSERTION N/A 08/10/2020   Procedure: DIALYSIS/PERMA CATHETER INSERTION;  Surgeon: Katha Cabal, MD;  Location: [place] [facility];  Service: Cardiovascular;  Laterality: N/A;  . DIALYSIS/PERMA CATHETER REMOVAL N/A  08/05/2020   Procedure: DIALYSIS/PERMA CATHETER REMOVAL;  Surgeon: Algernon Huxley, MD;  Location: [place] [facility];  Service: Cardiovascular;  Laterality: N/A;  . INTRAOPERATIVE TRANSESOPHAGEAL ECHOCARDIOGRAM  09/05/2012   Procedure: INTRAOPERATIVE TRANSESOPHAGEAL ECHOCARDIOGRAM;  Surgeon: Rexene Alberts, MD;  Location: [place];  Service: Open Heart Surgery;  Laterality: N/A;  . KNEE SURGERY  Bilateral nerve block  . LAPAROSCOPIC NISSEN FUNDOPLICATION    . OVARIAN CYST REMOVAL      Family History  Problem Relation Age of Onset  . Heart disease Brother        CABG & stents  . Hyperlipidemia Brother   . Hypertension Brother   . Cancer Father   . Heart disease Mother   . Breast cancer Cousin   . Kidney disease Neg Hx     Allergies  Allergen Reactions  . Clonidine Other (See Comments) and Shortness Of Breath    Other reaction(s): Other (see comments) Other reaction(s): Other (See Comments)  . Darvocet [Propoxyphene N-Acetaminophen] Anaphylaxis  . Hydralazine     Other reaction(s): Other (See Comments) glomerulonephritis  . Hydralazine Hcl Other (See Comments)    glomerulonephritis  . Esomeprazole     Other reaction(s): Unknown  . Guaifenesin     Other reaction(s): Unknown Other reaction(s): Unknown Other reaction(s): Unknown Other reaction(s): Unknown  . Phenylephrine-Guaifenesin     Other reaction(s): Unknown  . Rabeprazole     Other reaction(s): Unknown  . Telithromycin     Other reaction(s): Unknown Other reaction(s): Unknown Other reaction(s): Unknown  . Fluocinolone Other (See Comments)    Feels crazy Other reaction(s): Other (see comments) Other reaction(s): Other (See Comments) Feels crazy Feels crazy  . Iodinated Diagnostic Agents Nausea And Vomiting and Other (See Comments)    Burning Other reaction(s): Other (see comments) Other reaction(s): Other (See Comments) Burning Nausea & vomiting  . Levaquin [Levofloxacin] Nausea Only  . Statins Other (See  Comments)    Myalgia Other reaction(s): Other (See Comments), Unknown, Unknown Myalgia    CBC Latest Ref Rng & Units 11/04/2020 08/07/2020 08/06/2020  WBC 4.0 - 10.5 K/uL - 4.7 -  Hemoglobin 12.0 - 15.0 g/dL 13.9 7.4(L) 7.3(L)  Hematocrit 36.0 - 46.0 % 41.0 23.9(L) -  Platelets 150 - 400 K/uL - 92(L) -      CMP     Component Value Date/Time   NA 137 11/04/2020 1208   NA 140 11/22/2015 0830   NA 142 05/07/2014 0516   K 5.5 (H) 11/04/2020 1208   K 3.2 (L) 05/07/2014 0516   CL 103 11/04/2020 1208   CL 104 05/07/2014 0516   CO2 27 08/10/2020 0418   CO2 29 05/07/2014 0516   GLUCOSE 68 (L) 11/04/2020 1208   GLUCOSE 64 (L) 05/07/2014 0516   BUN 36 (H) 11/04/2020 1208   BUN 35 (H) 11/22/2015 0830   BUN 26 (H) 05/07/2014 0516   CREATININE 4.90 (H) 11/04/2020 1208   CREATININE 1.13 05/07/2014 0516   CALCIUM 6.9 (L) 08/10/2020 0418   CALCIUM 7.9 (L) 05/07/2014 0516   PROT 6.5 08/03/2020 1557   PROT 8.0 05/03/2014 0451   ALBUMIN 2.2 (L) 08/05/2020 0817   ALBUMIN 2.9 (L) 05/03/2014 0451   AST 12 (L) 08/03/2020 1557   AST 25 05/03/2014 0451   ALT 7 08/03/2020 1557   ALT 19 05/03/2014 0451   ALKPHOS 68 08/03/2020 1557   ALKPHOS 109 05/03/2014 0451   BILITOT 0.8 08/03/2020 1557   BILITOT 0.3 05/03/2014 0451   GFRNONAA 6 (L) 08/10/2020 0418   GFRNONAA 47 (L) 05/07/2014 0516   GFRAA 6 (L) 06/30/2020 0611   GFRAA 54 (L) 05/07/2014 0516     No results found.     Assessment & Plan:   1. Lower limb ulcer, ankle, right, limited to breakdown of skin ([place]) Today the patient's wounds are healed.  The  swelling is also greatly controlled.  The patient is advised to continue with conservative therapy including medical grade compression stockings, elevation and activity if possible.  Stockings should be worn daily and place first thing in the morning and is specifically told not to sleep in.  Patient should be elevating her legs when she is not active.  Patient is advised to contact the  office if she begins to have severe swelling ulcerations again.  2. ESRD (end stage renal disease) on dialysis [facility]) Recommend:  The patient is doing well and currently has adequate dialysis access.  The patient has been cleared by our office to begin using her dialysis access at her last office visit with Dr. Lucky Cowboy The patient's dialysis center is not reporting any access issues. Flow pattern is stable when compared to the prior ultrasound.  The patient should have a duplex ultrasound of the dialysis access in 6 months. The patient will follow-up with me in the office after each ultrasound     3. Primary hypertension Continue antihypertensive medications as already ordered, these medications have been reviewed and there are no changes at this time.    Current Outpatient Medications on File Prior to Visit  Medication Sig Dispense Refill  . ALPRAZolam (XANAX) 0.5 MG tablet Take 0.5 mg by mouth 2 (two) times daily as needed for anxiety.     Marland Kitchen aspirin 81 MG chewable tablet Chew 81 mg by mouth every Monday, Wednesday, and Friday.     . calcium-vitamin D (OSCAL WITH D) 500-200 MG-UNIT tablet Take 1 tablet by mouth daily.    . Cyanocobalamin 1000 MCG SUBL Take 1,000 mcg by mouth daily.     . furosemide (LASIX) 20 MG tablet Take 20 mg by mouth daily.    Marland Kitchen gabapentin (NEURONTIN) 100 MG capsule Take 100 mg by mouth 2 (two) times daily.    Marland Kitchen gabapentin (NEURONTIN) 100 MG capsule Take 1 capsule by mouth 2 (two) times daily.    Marland Kitchen glimepiride (AMARYL) 4 MG tablet Take 4 mg by mouth daily with breakfast.    . hydrALAZINE (APRESOLINE) 25 MG tablet Take 25 mg by mouth 3 (three) times daily.    Marland Kitchen HYDROcodone-acetaminophen (NORCO/VICODIN) 5-325 MG tablet Take 1 tablet by mouth every 6 (six) hours as needed for moderate pain. 30 tablet 0  . levothyroxine (SYNTHROID) 150 MCG tablet Take by mouth.    . levothyroxine (SYNTHROID, LEVOTHROID) 150 MCG tablet Take 150 mcg daily before breakfast by mouth.    .  metoprolol tartrate (LOPRESSOR) 50 MG tablet Take 50 mg by mouth in the morning, at noon, and at bedtime.    . OXYGEN Inhale 2 L into the lungs daily.    Marland Kitchen sulfamethoxazole-trimethoprim (BACTRIM DS) 800-160 MG tablet Take 1 tablet by mouth 2 (two) times daily.     No current facility-administered medications on file prior to visit.    There are no Patient Instructions on file for this visit. No follow-ups on file.   Kris Hartmann, NP

## 2021-05-09 NOTE — Telephone Encounter (Signed)
[facility] wanted patient to call in to get an appointment to see Dr. Lucky Cowboy,  [name] is  stating patient arm is hot feeling and wanted him to check to be sure, nothing is going on with it.  It is patient left arm.  Please advise.

## 2021-05-09 NOTE — Telephone Encounter (Signed)
I spoke with Araceli Bouche from [facility] he stated that the bruising hasn't healed and it is warm around the access. The left arm is cool to the touch but they wanted to make sure that it fine to start using the access. I spoke with Dr Delana Meyer and the patient can be schedule to come in with HDA see Dew or Arna Medici.

## 2021-05-11 ENCOUNTER — Telehealth: Payer: Self-pay | Admitting: Cardiovascular Disease

## 2021-05-11 NOTE — Telephone Encounter (Signed)
Pt c/o medication issue:  1. Name of Medication: torsemide (not on med list but husband spelled out that this what she is taking)  2. How are you currently taking this medication (dosage and times per day)? 20 MG - she takes Tuesday, Thursday and Saturday   3. Are you having a reaction (difficulty breathing--STAT)? swelling  4. What is your medication issue? Patient calling, states this pill is not working for her.  Lots of swelling, left foot swollen the worst.  Please call to discuss .

## 2021-05-11 NOTE — Telephone Encounter (Signed)
Was able to reach back out to [name] regarding her chronic leg edema. Reports her left leg has had recent increase in swelling. Reports has tried to call Dr. Lucky Cowboy for a sooner appt, but he "is double book, no sooner then 8/30".   Pt reports still taking her fluids pills as order  Torsemide 100 mg once a day  on the non dialysis days Tuesday/thursday/sat  Lasix 20 mg daily   [name] reports no weight gain, no SOB, no CP Cannot use compression pumps, has tried ace wraps, but no improvement.  Advised venous insufficiency appears to be the source to her issues.  Adding extra fluid pill may not be beneficial and harmful to her CKD as she is already on dialysis. Pt reports understanding will continue her torsemide and lasix as order.   I have advised the patient that her swelling seems to be more venous in nature.  I have asked her to: - elevate her lower extremities when possible - use some compression socks if able, ace wrap when she can - decrease her salt intake, reduce her fried chicken tenders - continue to weight daily   Pt verbalized understanding, thankful for the call and recommendations, will keep appt with Dr. Lucky Cowboy and hopes to get in sooner. Will call back if she would like an appt with our office.

## 2021-05-12 ENCOUNTER — Telehealth (INDEPENDENT_AMBULATORY_CARE_PROVIDER_SITE_OTHER): Payer: Self-pay | Admitting: Vascular Surgery

## 2021-05-12 NOTE — Telephone Encounter (Signed)
Documentation only.

## 2021-05-16 ENCOUNTER — Telehealth (INDEPENDENT_AMBULATORY_CARE_PROVIDER_SITE_OTHER): Payer: Self-pay | Admitting: Vascular Surgery

## 2021-05-16 NOTE — Telephone Encounter (Signed)
Called to inform us that her permpath dislodged on Saturday. (Patient states permcath is on her table and while she was standing up it fell out.)  Patient states that she received dialysis on Friday and is suppose to come in with [place] on Thursday (fistula is warm to the touch, no bleeding). Patient is suppose to go to dialysis today. [facility] is not using access until patient is cleared by [place]. Please advise.

## 2021-05-16 NOTE — Telephone Encounter (Signed)
Called patient and made her aware of Np's  note. Dialysis was also made aware per last provider note they have been cleared to use patients access and if they cannot use access they are to send over an order for a permcath placement. Patient is expected to go to dialyze today and [facility] is to keep [place] posted on the use of patients access.  This note is for documentation purposes only

## 2021-05-16 NOTE — Telephone Encounter (Signed)
The patient was cleared to use her access weeks ago.  A letter was sent to her dialysis center and they even called to verify it.  If her permcath is out, her dialysis center either needs to use her fistula or they need to send [place] orders to insert a new permcath

## 2021-05-19 ENCOUNTER — Encounter (INDEPENDENT_AMBULATORY_CARE_PROVIDER_SITE_OTHER): Payer: Medicare Other

## 2021-05-19 ENCOUNTER — Ambulatory Visit (INDEPENDENT_AMBULATORY_CARE_PROVIDER_SITE_OTHER): Payer: Medicare Other | Admitting: Nurse Practitioner

## 2021-05-20 ENCOUNTER — Telehealth (INDEPENDENT_AMBULATORY_CARE_PROVIDER_SITE_OTHER): Payer: Self-pay | Admitting: Vascular Surgery

## 2021-05-20 NOTE — Telephone Encounter (Signed)
Called stating that both feet are swollen, patient states that she can not tolerate compression socks but has been elevating her legs when possible. Patient states that her left leg burns and she is experiencing pain. Patient called to see if she could come in sooner to see JD. Does patient need [place] with visit? Please advise.

## 2021-05-20 NOTE — Telephone Encounter (Signed)
She doesn't need studies and she can be moved up to see Dew (per her request) if there is availability

## 2021-05-21 ENCOUNTER — Other Ambulatory Visit: Payer: Self-pay

## 2021-05-21 ENCOUNTER — Inpatient Hospital Stay
Admission: EM | Admit: 2021-05-21 | Discharge: 2021-05-25 | DRG: 252 | Disposition: A | Discharge status: Home Health | Payer: Medicare Other | Attending: Internal Medicine | Admitting: Internal Medicine

## 2021-05-21 ENCOUNTER — Emergency Department: Payer: Medicare Other

## 2021-05-21 ENCOUNTER — Encounter: Payer: Self-pay | Admitting: Internal Medicine

## 2021-05-21 DIAGNOSIS — E1140 Type 2 diabetes mellitus with diabetic neuropathy, unspecified: Secondary | ICD-10-CM | POA: Diagnosis present

## 2021-05-21 DIAGNOSIS — Y712 Prosthetic and other implants, materials and accessory cardiovascular devices associated with adverse incidents: Secondary | ICD-10-CM | POA: Diagnosis present

## 2021-05-21 DIAGNOSIS — J9621 Acute and chronic respiratory failure with hypoxia: Secondary | ICD-10-CM | POA: Diagnosis present

## 2021-05-21 DIAGNOSIS — I6522 Occlusion and stenosis of left carotid artery: Secondary | ICD-10-CM | POA: Diagnosis present

## 2021-05-21 DIAGNOSIS — I2510 Atherosclerotic heart disease of native coronary artery without angina pectoris: Secondary | ICD-10-CM | POA: Diagnosis present

## 2021-05-21 DIAGNOSIS — H3530 Unspecified macular degeneration: Secondary | ICD-10-CM | POA: Diagnosis present

## 2021-05-21 DIAGNOSIS — R269 Unspecified abnormalities of gait and mobility: Secondary | ICD-10-CM | POA: Diagnosis present

## 2021-05-21 DIAGNOSIS — E1122 Type 2 diabetes mellitus with diabetic chronic kidney disease: Secondary | ICD-10-CM | POA: Diagnosis present

## 2021-05-21 DIAGNOSIS — R0902 Hypoxemia: Secondary | ICD-10-CM

## 2021-05-21 DIAGNOSIS — J810 Acute pulmonary edema: Secondary | ICD-10-CM | POA: Diagnosis present

## 2021-05-21 DIAGNOSIS — E039 Hypothyroidism, unspecified: Secondary | ICD-10-CM | POA: Diagnosis present

## 2021-05-21 DIAGNOSIS — Z66 Do not resuscitate: Secondary | ICD-10-CM | POA: Diagnosis present

## 2021-05-21 DIAGNOSIS — I5031 Acute diastolic (congestive) heart failure: Secondary | ICD-10-CM | POA: Diagnosis not present

## 2021-05-21 DIAGNOSIS — I871 Compression of vein: Secondary | ICD-10-CM | POA: Diagnosis present

## 2021-05-21 DIAGNOSIS — J811 Chronic pulmonary edema: Secondary | ICD-10-CM | POA: Diagnosis present

## 2021-05-21 DIAGNOSIS — Z9981 Dependence on supplemental oxygen: Secondary | ICD-10-CM | POA: Diagnosis not present

## 2021-05-21 DIAGNOSIS — Z8673 Personal history of transient ischemic attack (TIA), and cerebral infarction without residual deficits: Secondary | ICD-10-CM

## 2021-05-21 DIAGNOSIS — Z7984 Long term (current) use of oral hypoglycemic drugs: Secondary | ICD-10-CM

## 2021-05-21 DIAGNOSIS — I132 Hypertensive heart and chronic kidney disease with heart failure and with stage 5 chronic kidney disease, or end stage renal disease: Secondary | ICD-10-CM | POA: Diagnosis present

## 2021-05-21 DIAGNOSIS — R531 Weakness: Secondary | ICD-10-CM

## 2021-05-21 DIAGNOSIS — B958 Unspecified staphylococcus as the cause of diseases classified elsewhere: Secondary | ICD-10-CM

## 2021-05-21 DIAGNOSIS — Z95820 Peripheral vascular angioplasty status with implants and grafts: Secondary | ICD-10-CM

## 2021-05-21 DIAGNOSIS — Z992 Dependence on renal dialysis: Secondary | ICD-10-CM

## 2021-05-21 DIAGNOSIS — Z881 Allergy status to other antibiotic agents status: Secondary | ICD-10-CM

## 2021-05-21 DIAGNOSIS — T82510A Breakdown (mechanical) of surgically created arteriovenous fistula, initial encounter: Principal | ICD-10-CM | POA: Diagnosis present

## 2021-05-21 DIAGNOSIS — I890 Lymphedema, not elsewhere classified: Secondary | ICD-10-CM | POA: Diagnosis present

## 2021-05-21 DIAGNOSIS — N2581 Secondary hyperparathyroidism of renal origin: Secondary | ICD-10-CM | POA: Diagnosis present

## 2021-05-21 DIAGNOSIS — Z951 Presence of aortocoronary bypass graft: Secondary | ICD-10-CM

## 2021-05-21 DIAGNOSIS — Z8249 Family history of ischemic heart disease and other diseases of the circulatory system: Secondary | ICD-10-CM

## 2021-05-21 DIAGNOSIS — N186 End stage renal disease: Secondary | ICD-10-CM | POA: Diagnosis present

## 2021-05-21 DIAGNOSIS — Z9115 Patient's noncompliance with renal dialysis: Secondary | ICD-10-CM

## 2021-05-21 DIAGNOSIS — L97311 Non-pressure chronic ulcer of right ankle limited to breakdown of skin: Secondary | ICD-10-CM | POA: Diagnosis present

## 2021-05-21 DIAGNOSIS — Z923 Personal history of irradiation: Secondary | ICD-10-CM

## 2021-05-21 DIAGNOSIS — M1990 Unspecified osteoarthritis, unspecified site: Secondary | ICD-10-CM | POA: Diagnosis present

## 2021-05-21 DIAGNOSIS — I5033 Acute on chronic diastolic (congestive) heart failure: Secondary | ICD-10-CM | POA: Diagnosis present

## 2021-05-21 DIAGNOSIS — R0603 Acute respiratory distress: Secondary | ICD-10-CM | POA: Diagnosis not present

## 2021-05-21 DIAGNOSIS — E785 Hyperlipidemia, unspecified: Secondary | ICD-10-CM | POA: Diagnosis present

## 2021-05-21 DIAGNOSIS — Z853 Personal history of malignant neoplasm of breast: Secondary | ICD-10-CM

## 2021-05-21 DIAGNOSIS — Z803 Family history of malignant neoplasm of breast: Secondary | ICD-10-CM

## 2021-05-21 DIAGNOSIS — R262 Difficulty in walking, not elsewhere classified: Secondary | ICD-10-CM | POA: Diagnosis present

## 2021-05-21 DIAGNOSIS — E119 Type 2 diabetes mellitus without complications: Secondary | ICD-10-CM

## 2021-05-21 DIAGNOSIS — Z90710 Acquired absence of both cervix and uterus: Secondary | ICD-10-CM

## 2021-05-21 DIAGNOSIS — T82590A Other mechanical complication of surgically created arteriovenous fistula, initial encounter: Secondary | ICD-10-CM | POA: Diagnosis not present

## 2021-05-21 DIAGNOSIS — I6930 Unspecified sequelae of cerebral infarction: Secondary | ICD-10-CM

## 2021-05-21 DIAGNOSIS — K219 Gastro-esophageal reflux disease without esophagitis: Secondary | ICD-10-CM | POA: Diagnosis present

## 2021-05-21 DIAGNOSIS — B9689 Other specified bacterial agents as the cause of diseases classified elsewhere: Secondary | ICD-10-CM | POA: Diagnosis present

## 2021-05-21 DIAGNOSIS — D631 Anemia in chronic kidney disease: Secondary | ICD-10-CM | POA: Diagnosis present

## 2021-05-21 DIAGNOSIS — R7881 Bacteremia: Secondary | ICD-10-CM | POA: Diagnosis present

## 2021-05-21 DIAGNOSIS — Z20822 Contact with and (suspected) exposure to covid-19: Secondary | ICD-10-CM | POA: Diagnosis present

## 2021-05-21 DIAGNOSIS — J189 Pneumonia, unspecified organism: Secondary | ICD-10-CM

## 2021-05-21 DIAGNOSIS — E875 Hyperkalemia: Secondary | ICD-10-CM | POA: Diagnosis present

## 2021-05-21 DIAGNOSIS — E8779 Other fluid overload: Secondary | ICD-10-CM | POA: Diagnosis present

## 2021-05-21 DIAGNOSIS — Z888 Allergy status to other drugs, medicaments and biological substances status: Secondary | ICD-10-CM

## 2021-05-21 DIAGNOSIS — Z79890 Hormone replacement therapy (postmenopausal): Secondary | ICD-10-CM

## 2021-05-21 DIAGNOSIS — Z79899 Other long term (current) drug therapy: Secondary | ICD-10-CM

## 2021-05-21 DIAGNOSIS — I878 Other specified disorders of veins: Secondary | ICD-10-CM | POA: Diagnosis present

## 2021-05-21 DIAGNOSIS — Z83438 Family history of other disorder of lipoprotein metabolism and other lipidemia: Secondary | ICD-10-CM

## 2021-05-21 DIAGNOSIS — Z885 Allergy status to narcotic agent status: Secondary | ICD-10-CM

## 2021-05-21 LAB — CBG MONITORING, ED
Glucose-Capillary: 98 mg/dL (ref 70–99)
Glucose-Capillary: 99 mg/dL (ref 70–99)

## 2021-05-21 LAB — CBC WITH DIFFERENTIAL/PLATELET
Abs Immature Granulocytes: 0.05 10*3/uL (ref 0.00–0.07)
Basophils Absolute: 0.1 10*3/uL (ref 0.0–0.1)
Basophils Relative: 1 %
Eosinophils Absolute: 0 10*3/uL (ref 0.0–0.5)
Eosinophils Relative: 0 %
HCT: 33.7 % — ABNORMAL LOW (ref 36.0–46.0)
Hemoglobin: 10.1 g/dL — ABNORMAL LOW (ref 12.0–15.0)
Immature Granulocytes: 0 %
Lymphocytes Relative: 3 %
Lymphs Abs: 0.3 10*3/uL — ABNORMAL LOW (ref 0.7–4.0)
MCH: 30.9 pg (ref 26.0–34.0)
MCHC: 30 g/dL (ref 30.0–36.0)
MCV: 103.1 fL — ABNORMAL HIGH (ref 80.0–100.0)
Monocytes Absolute: 0.6 10*3/uL (ref 0.1–1.0)
Monocytes Relative: 4 %
Neutro Abs: 12.1 10*3/uL — ABNORMAL HIGH (ref 1.7–7.7)
Neutrophils Relative %: 92 %
Platelets: 210 10*3/uL (ref 150–400)
RBC: 3.27 MIL/uL — ABNORMAL LOW (ref 3.87–5.11)
RDW: 15.3 % (ref 11.5–15.5)
WBC: 13.1 10*3/uL — ABNORMAL HIGH (ref 4.0–10.5)
nRBC: 0 % (ref 0.0–0.2)

## 2021-05-21 LAB — CBC
HCT: 30.7 % — ABNORMAL LOW (ref 36.0–46.0)
Hemoglobin: 9.3 g/dL — ABNORMAL LOW (ref 12.0–15.0)
MCH: 31.2 pg (ref 26.0–34.0)
MCHC: 30.3 g/dL (ref 30.0–36.0)
MCV: 103 fL — ABNORMAL HIGH (ref 80.0–100.0)
Platelets: 180 10*3/uL (ref 150–400)
RBC: 2.98 MIL/uL — ABNORMAL LOW (ref 3.87–5.11)
RDW: 15.5 % (ref 11.5–15.5)
WBC: 15.2 10*3/uL — ABNORMAL HIGH (ref 4.0–10.5)
nRBC: 0 % (ref 0.0–0.2)

## 2021-05-21 LAB — COMPREHENSIVE METABOLIC PANEL
ALT: 8 U/L (ref 0–44)
AST: 14 U/L — ABNORMAL LOW (ref 15–41)
Albumin: 3 g/dL — ABNORMAL LOW (ref 3.5–5.0)
Alkaline Phosphatase: 102 U/L (ref 38–126)
Anion gap: 7 (ref 5–15)
BUN: 51 mg/dL — ABNORMAL HIGH (ref 8–23)
CO2: 26 mmol/L (ref 22–32)
Calcium: 7.8 mg/dL — ABNORMAL LOW (ref 8.9–10.3)
Chloride: 102 mmol/L (ref 98–111)
Creatinine, Ser: 5.99 mg/dL — ABNORMAL HIGH (ref 0.44–1.00)
GFR, Estimated: 6 mL/min — ABNORMAL LOW (ref >60)
Glucose, Bld: 156 mg/dL — ABNORMAL HIGH (ref 70–99)
Potassium: 5.3 mmol/L — ABNORMAL HIGH (ref 3.5–5.1)
Sodium: 135 mmol/L (ref 135–145)
Total Bilirubin: 0.7 mg/dL (ref 0.3–1.2)
Total Protein: 7.9 g/dL (ref 6.5–8.1)

## 2021-05-21 LAB — BRAIN NATRIURETIC PEPTIDE: B Natriuretic Peptide: 1955.9 pg/mL — ABNORMAL HIGH (ref 0.0–100.0)

## 2021-05-21 LAB — PROCALCITONIN: Procalcitonin: 0.77 ng/mL

## 2021-05-21 LAB — RESP PANEL BY RT-PCR (FLU A&B, COVID) ARPGX2
Influenza A by PCR: NEGATIVE
Influenza B by PCR: NEGATIVE
SARS Coronavirus 2 by RT PCR: NEGATIVE

## 2021-05-21 LAB — TROPONIN I (HIGH SENSITIVITY)
Troponin I (High Sensitivity): 12 ng/L (ref <18)
Troponin I (High Sensitivity): 56 ng/L — ABNORMAL HIGH (ref <18)

## 2021-05-21 LAB — LACTIC ACID, PLASMA
Lactic Acid, Venous: 2 mmol/L (ref 0.5–1.9)
Lactic Acid, Venous: 2.1 mmol/L (ref 0.5–1.9)

## 2021-05-21 LAB — GLUCOSE, CAPILLARY: Glucose-Capillary: 97 mg/dL (ref 70–99)

## 2021-05-21 MED ORDER — FUROSEMIDE 10 MG/ML IJ SOLN
80.0000 mg | Freq: Once | INTRAMUSCULAR | Status: AC
  Administered 2021-05-21: 80 mg via INTRAVENOUS
  Filled 2021-05-21: qty 8

## 2021-05-21 MED ORDER — HYDRALAZINE HCL 25 MG PO TABS
25.0000 mg | ORAL_TABLET | Freq: Three times a day (TID) | ORAL | Status: DC
  Administered 2021-05-21 – 2021-05-24 (×9): 25 mg via ORAL
  Filled 2021-05-21 (×9): qty 1

## 2021-05-21 MED ORDER — CYANOCOBALAMIN 1000 MCG SL SUBL: 1000.0000 ug | SUBLINGUAL_TABLET | Freq: Every day | SUBLINGUAL | Status: DC

## 2021-05-21 MED ORDER — METOPROLOL TARTRATE 50 MG PO TABS
50.0000 mg | ORAL_TABLET | Freq: Two times a day (BID) | ORAL | Status: DC
  Administered 2021-05-21 – 2021-05-24 (×7): 50 mg via ORAL
  Filled 2021-05-21 (×7): qty 1

## 2021-05-21 MED ORDER — SODIUM CHLORIDE 0.9 % IV SOLN
100.0000 mg | Freq: Once | INTRAVENOUS | Status: AC
  Administered 2021-05-21: 100 mg via INTRAVENOUS
  Filled 2021-05-21: qty 100

## 2021-05-21 MED ORDER — INSULIN ASPART 100 UNIT/ML IJ SOLN
0.0000 [IU] | Freq: Three times a day (TID) | INTRAMUSCULAR | Status: DC
  Administered 2021-05-23: 2 [IU] via SUBCUTANEOUS
  Administered 2021-05-24: 1 [IU] via SUBCUTANEOUS
  Filled 2021-05-21 (×2): qty 1
  Filled 2021-05-21: qty 0.09

## 2021-05-21 MED ORDER — VITAMIN B-12 1000 MCG PO TABS
1000.0000 ug | ORAL_TABLET | Freq: Every day | ORAL | Status: DC
  Administered 2021-05-22 – 2021-05-25 (×4): 1000 ug via ORAL
  Filled 2021-05-21 (×5): qty 1

## 2021-05-21 MED ORDER — ASPIRIN 81 MG PO CHEW
81.0000 mg | CHEWABLE_TABLET | ORAL | Status: DC
  Administered 2021-05-23 – 2021-05-25 (×2): 81 mg via ORAL
  Filled 2021-05-21 (×2): qty 1

## 2021-05-21 MED ORDER — LEVOTHYROXINE SODIUM 50 MCG PO TABS
150.0000 ug | ORAL_TABLET | Freq: Every day | ORAL | Status: DC
  Administered 2021-05-22 – 2021-05-25 (×3): 150 ug via ORAL
  Filled 2021-05-21 (×3): qty 1

## 2021-05-21 MED ORDER — INSULIN ASPART 100 UNIT/ML IJ SOLN: 0.0000 [IU] | Freq: Every day | INTRAMUSCULAR | Status: DC

## 2021-05-21 MED ORDER — HYDROCODONE-ACETAMINOPHEN 5-325 MG PO TABS
1.0000 | ORAL_TABLET | Freq: Four times a day (QID) | ORAL | Status: DC | PRN
  Administered 2021-05-21 – 2021-05-24 (×7): 1 via ORAL
  Filled 2021-05-21 (×7): qty 1

## 2021-05-21 MED ORDER — ONDANSETRON HCL 4 MG PO TABS: 4.0000 mg | ORAL_TABLET | Freq: Four times a day (QID) | ORAL | Status: DC | PRN

## 2021-05-21 MED ORDER — CHLORHEXIDINE GLUCONATE CLOTH 2 % EX PADS
6.0000 | MEDICATED_PAD | Freq: Every day | CUTANEOUS | Status: DC
  Administered 2021-05-21 – 2021-05-25 (×4): 6 via TOPICAL
  Filled 2021-05-21: qty 6

## 2021-05-21 MED ORDER — INSULIN ASPART 100 UNIT/ML IJ SOLN: 0.0000 [IU] | Freq: Three times a day (TID) | INTRAMUSCULAR | Status: DC

## 2021-05-21 MED ORDER — SODIUM CHLORIDE 0.9 % IV SOLN
1.0000 g | Freq: Once | INTRAVENOUS | Status: AC
  Administered 2021-05-21: 1 g via INTRAVENOUS
  Filled 2021-05-21: qty 10

## 2021-05-21 MED ORDER — ALPRAZOLAM 0.5 MG PO TABS
0.5000 mg | ORAL_TABLET | Freq: Two times a day (BID) | ORAL | Status: DC | PRN
  Administered 2021-05-22 – 2021-05-24 (×3): 0.5 mg via ORAL
  Filled 2021-05-21 (×3): qty 1

## 2021-05-21 MED ORDER — FUROSEMIDE 20 MG PO TABS
20.0000 mg | ORAL_TABLET | Freq: Every day | ORAL | Status: DC
  Administered 2021-05-22: 20 mg via ORAL
  Filled 2021-05-21: qty 1

## 2021-05-21 MED ORDER — GABAPENTIN 100 MG PO CAPS
100.0000 mg | ORAL_CAPSULE | Freq: Two times a day (BID) | ORAL | Status: DC
  Administered 2021-05-21 – 2021-05-25 (×8): 100 mg via ORAL
  Filled 2021-05-21 (×8): qty 1

## 2021-05-21 MED ORDER — ONDANSETRON HCL 4 MG/2ML IJ SOLN: 4.0000 mg | Freq: Four times a day (QID) | INTRAMUSCULAR | Status: DC | PRN

## 2021-05-21 MED ORDER — ACETAMINOPHEN 650 MG RE SUPP: 650.0000 mg | Freq: Four times a day (QID) | RECTAL | Status: DC | PRN

## 2021-05-21 MED ORDER — ACETAMINOPHEN 325 MG PO TABS: 650.0000 mg | ORAL_TABLET | Freq: Four times a day (QID) | ORAL | Status: DC | PRN

## 2021-05-21 MED ORDER — CALCIUM CARBONATE-VITAMIN D 500-200 MG-UNIT PO TABS
1.0000 | ORAL_TABLET | Freq: Every day | ORAL | Status: DC
  Administered 2021-05-22 – 2021-05-25 (×4): 1 via ORAL
  Filled 2021-05-21 (×4): qty 1

## 2021-05-21 MED ORDER — HEPARIN SODIUM (PORCINE) 5000 UNIT/ML IJ SOLN
5000.0000 [IU] | Freq: Three times a day (TID) | INTRAMUSCULAR | Status: DC
  Administered 2021-05-21 – 2021-05-25 (×11): 5000 [IU] via SUBCUTANEOUS
  Filled 2021-05-21 (×11): qty 1

## 2021-05-21 NOTE — H&P (Signed)
History and Physical    [name] PJK:[id number] DOB: 04/20/1934 DOA: 05/21/2021  PCP: Idelle Crouch, MD   Patient coming from: Home  I have personally briefly reviewed patient's old medical records in [place]  Chief Complaint: Shortness of breath Most of the history was obtained from patient's husband at the bedside.  HPI: [name] is a 87 y.o. female with medical history significant for diabetes mellitus with complications of end-stage renal disease on hemodialysis (M/W/F), coronary artery disease status post CABG, chronic respiratory failure on 2 L of oxygen, chronic diastolic dysfunction CHF, hypothyroidism, hypertension who presents to the emergency room via EMS for evaluation of sudden onset shortness of breath. Patient's husband states that patient did not get dialyzed on Monday, 08/15 because the dialysis staff were concerned that her AV graft was infected as it was warm to touch.  She followed up with vascular surgery who recommended that the AV graft could be used for dialysis. Patient also had a PermCath which dislodged on 08/13. She completed her dialysis session on 08/17 but went for dialysis 1 day prior to her admission and after 8 minutes her dialysis access clotted and they were unable to continue her treatment. Her husband states that he was awakened in the early hours of the morning to the patient yelling out that she did not feel well.  She was in respiratory distress and EMS was called and she had room air pulse oximetry in the mid 60s.  She was then placed on a nonrebreather mask and transported to the ER. She denies having any chest pain, no nausea, no vomiting, no diaphoresis, no palpitations, no dizziness, no lightheadedness, no abdominal pain, no changes in her bowel habits, no fever, no chills, no cough, no blurred vision no focal deficit. She does have bilateral lower extremity swelling which is chronic. Labs show sodium 135, potassium 4.3,  chloride 102, bicarb 26, glucose 156, BUN 51, creatinine 5.9, calcium 7.8, alkaline phosphatase 102, albumin 3.0, AST 14, ALT 8, total protein 7.9, BNP 1955, troponin 12 >> 56, lactic acid 2.1, procalcitonin 0.7, white count 15.2, hemoglobin 9.3, hematocrit 30.7, MCV 103, RDW 15.5, platelet count 180 Respiratory viral panel is negative Chest x-ray reviewed by me shows cardiomegaly and mild pulmonary edema Twelve-lead EKG reviewed by me shows sinus tachycardia, T wave inversions in the lateral leads and left axis deviation.      ED Course: Patient is an 87 year old Caucasian female with multiple medical problems including end-stage renal disease on hemodialysis who presents to the emergency room via EMS for evaluation of sudden onset respiratory distress and hypoxia. Patient's dialysis was terminated because her AV graft clotted and she presents to the ER now with acute diastolic dysfunction CHF and worsening respiratory failure. I have consulted vascular surgery for insertion of temporary dialysis access and have also consulted nephrology. Patient will be admitted to the hospital for further evaluation.    Review of Systems: As per HPI otherwise all other systems reviewed and negative.    Past Medical History:  Diagnosis Date   Arthritis    Bilateral Macular degeneration    Breast cancer ([place]) 2012   right breast   Breast mass, right    CAD S/P CABG x 4    a. 09/2012 CABG x 4: LIMA to LAD, SVG to Diag, SVG to OM1, SVG to PDA, EVH from bilateral thighs   Cancer ([place])    breast cancer, right side 8099   Complication of anesthesia  Coronary artery disease 08/22/2012   sees Dr Rockey Situ   Diabetes [facility])    Diastolic dysfunction    a. 06/2020 Echo: EF 60-65%; b. 08/2020 Echo: EF 60-65%, no rwma, Gr1 DD. Nl RV size/fxn.   DOE (dyspnea on exertion)    ESRD (end stage renal disease) ([place])    Gastritis    hx of   GERD (gastroesophageal reflux disease)    Headache(784.0)    migraines    History of breast cancer    39 treatments of radiation. Negative chemo.   History of seasonal allergies    Hyperlipemia    Hypertension    sees Dr. Fulton Reek   Hypothyroidism    Neuropathy    Personal history of radiation therapy    Pneumonia    hx of   PONV (postoperative nausea and vomiting)    Stroke [facility])    2012   Vertigo     Past Surgical History:  Procedure Laterality Date   A/V FISTULAGRAM Left 01/13/2021   Procedure: A/V FISTULAGRAM;  Surgeon: Algernon Huxley, MD;  Location: [place] [facility];  Service: Cardiovascular;  Laterality: Left;   ABDOMINAL HYSTERECTOMY     APPENDECTOMY     AV FISTULA PLACEMENT Left 11/04/2020   Procedure: ARTERIOVENOUS (AV) FISTULA CREATION (BRACHIOCEPHALIC);  Surgeon: Algernon Huxley, MD;  Location: [facility] ORS;  Service: Vascular;  Laterality: Left;   BACK SURGERY     BREAST BIOPSY Right    2012 positive- [facility]   BREAST LUMPECTOMY Right 2012   F/U radiation    BREAST SURGERY     CARDIAC CATHETERIZATION  2014   CAROTID PTA/STENT INTERVENTION Left 06/14/2020   Procedure: CAROTID PTA/STENT INTERVENTION;  Surgeon: Algernon Huxley, MD;  Location: [place] [facility];  Service: Cardiovascular;  Laterality: Left;   CATARACT EXTRACTION W/PHACO Right 08/14/2017   Procedure: CATARACT EXTRACTION PHACO AND INTRAOCULAR LENS PLACEMENT (IOC);  Surgeon: Birder Robson, MD;  Location: [facility] ORS;  Service: Ophthalmology;  Laterality: Right;  [place] 00:43.0 AP% 17.1 CDE 7.37 Fluid Pack lot # [id number]   CATARACT EXTRACTION W/PHACO Left 10/09/2017   Procedure: CATARACT EXTRACTION PHACO AND INTRAOCULAR LENS PLACEMENT (IOC);  Surgeon: Birder Robson, MD;  Location: [facility] OR[facility];  Service: Ophthalmology;  Laterality: Left;  [place] 00:30.3 AP% 11.0 CDE 3.33 Fluid Pack Lot # [id number]   CORONARY ARTERY BYPASS GRAFT  09/05/2012   Procedure: CORONARY ARTERY BYPASS GRAFTING (CABG);  Surgeon: Rexene Alberts, MD;  Location: [place];  Service: Open Heart Surgery;  Laterality: N/A;  CABG  x four, using left internal mammary artery and bilateral greater saphenous vein harvested endoscopically   DIALYSIS/PERMA CATHETER INSERTION Left 06/25/2020   Procedure: DIALYSIS/PERMA CATHETER INSERTION;  Surgeon: Algernon Huxley, MD;  Location: [place] [facility];  Service: Cardiovascular;  Laterality: Left;   DIALYSIS/PERMA CATHETER INSERTION N/A 08/10/2020   Procedure: DIALYSIS/PERMA CATHETER INSERTION;  Surgeon: Katha Cabal, MD;  Location: [place] [facility];  Service: Cardiovascular;  Laterality: N/A;   DIALYSIS/PERMA CATHETER REMOVAL N/A 08/05/2020   Procedure: DIALYSIS/PERMA CATHETER REMOVAL;  Surgeon: Algernon Huxley, MD;  Location: [place] [facility];  Service: Cardiovascular;  Laterality: N/A;   INTRAOPERATIVE TRANSESOPHAGEAL ECHOCARDIOGRAM  09/05/2012   Procedure: INTRAOPERATIVE TRANSESOPHAGEAL ECHOCARDIOGRAM;  Surgeon: Rexene Alberts, MD;  Location: [place];  Service: Open Heart Surgery;  Laterality: N/A;   KNEE SURGERY     Bilateral nerve block   LAPAROSCOPIC NISSEN FUNDOPLICATION     OVARIAN CYST REMOVAL  reports that she quit smoking about 32 years ago. Her smoking use included cigarettes. She has never used smokeless tobacco. She reports that she does not drink alcohol and does not use drugs.  Allergies  Allergen Reactions   Clonidine Other (See Comments) and Shortness Of Breath    Other reaction(s): Other (see comments) Other reaction(s): Other (See Comments)   Darvocet [Propoxyphene N-Acetaminophen] Anaphylaxis   Hydralazine     Other reaction(s): Other (See Comments) glomerulonephritis   Hydralazine Hcl Other (See Comments)    glomerulonephritis   Esomeprazole     Other reaction(s): Unknown   Guaifenesin     Other reaction(s): Unknown Other reaction(s): Unknown Other reaction(s): Unknown Other reaction(s): Unknown   Phenylephrine-Guaifenesin     Other reaction(s): Unknown   Rabeprazole     Other reaction(s): Unknown   Telithromycin     Other  reaction(s): Unknown Other reaction(s): Unknown Other reaction(s): Unknown   Fluocinolone Other (See Comments)    Feels crazy Other reaction(s): Other (see comments) Other reaction(s): Other (See Comments) Feels crazy Feels crazy   Iodinated Diagnostic Agents Nausea And Vomiting and Other (See Comments)    Burning Other reaction(s): Other (see comments) Other reaction(s): Other (See Comments) Burning Nausea & vomiting   Levaquin [Levofloxacin] Nausea Only   Statins Other (See Comments)    Myalgia Other reaction(s): Other (See Comments), Unknown, Unknown Myalgia    Family History  Problem Relation Age of Onset   Heart disease Brother        CABG & stents   Hyperlipidemia Brother    Hypertension Brother    Cancer Father    Heart disease Mother    Breast cancer Cousin    Kidney disease Neg Hx       Prior to Admission medications   Medication Sig Start Date End Date Taking? Authorizing Provider  ALPRAZolam Duanne Moron) 0.5 MG tablet Take 0.5 mg by mouth 2 (two) times daily as needed for anxiety.     [provider]  aspirin 81 MG chewable tablet Chew 81 mg by mouth every Monday, Wednesday, and Friday.     [provider]  calcium-vitamin D (OSCAL WITH D) 500-200 MG-UNIT tablet Take 1 tablet by mouth daily.    [provider]  Cyanocobalamin 1000 MCG SUBL Take 1,000 mcg by mouth daily.     [provider]  furosemide (LASIX) 20 MG tablet Take 20 mg by mouth daily. 04/06/21   [provider]  gabapentin (NEURONTIN) 100 MG capsule Take 100 mg by mouth 2 (two) times daily. 09/23/12   Love, Ivan Anchors, PA-C  gabapentin (NEURONTIN) 100 MG capsule Take 1 capsule by mouth 2 (two) times daily. 04/11/21   [provider]  glimepiride (AMARYL) 4 MG tablet Take 4 mg by mouth daily with breakfast.    [provider]  hydrALAZINE (APRESOLINE) 25 MG tablet Take 25 mg by mouth 3 (three) times daily.    [provider]   HYDROcodone-acetaminophen (NORCO/VICODIN) 5-325 MG tablet Take 1 tablet by mouth every 6 (six) hours as needed for moderate pain. 11/04/20   Algernon Huxley, MD  levothyroxine (SYNTHROID) 150 MCG tablet Take by mouth. 04/19/21   [provider]  levothyroxine (SYNTHROID, LEVOTHROID) 150 MCG tablet Take 150 mcg daily before breakfast by mouth.    [provider]  metoprolol tartrate (LOPRESSOR) 50 MG tablet Take 50 mg by mouth in the morning, at noon, and at bedtime.    [provider]  OXYGEN Inhale 2  L into the lungs daily.    [provider]  sulfamethoxazole-trimethoprim (BACTRIM DS) 800-160 MG tablet Take 1 tablet by mouth 2 (two) times daily. 04/19/21   [provider]    Physical Exam: Vitals:   05/21/21 0337 05/21/21 0343 05/21/21 0430 05/21/21 0800  BP:   (!) 138/50 (!) 125/58  Pulse:   100 96  Resp:   20 (!) 21  Temp:      TempSrc:      SpO2: 100% 96% 100% 97%     Vitals:   05/21/21 0337 05/21/21 0343 05/21/21 0430 05/21/21 0800  BP:   (!) 138/50 (!) 125/58  Pulse:   100 96  Resp:   20 (!) 21  Temp:      TempSrc:      SpO2: 100% 96% 100% 97%      Constitutional: Lethargic but arouses to verbal stimuli and able to answer questions.  Tachypneic nonrebreather mask in place.  Chronically ill-appearing HEENT:      Head: Normocephalic and atraumatic.         Eyes: PERLA, EOMI, Conjunctivae are normal. Sclera is non-icteric.       Mouth/Throat: Mucous membranes are moist.       Neck: Supple with no signs of meningismus. Cardiovascular: Regular rate and rhythm. No murmurs, gallops, or rubs. 2+ symmetrical distal pulses are present . No JVD. 4+ LE edema Respiratory: Crackles in both lung fields bilaterally. No wheezes or rhonchi.  Gastrointestinal: Soft, non tender, and non distended with positive bowel sounds.  Genitourinary: No CVA tenderness. Musculoskeletal: Nontender with normal range of motion in all extremities.  Significant  bilateral lower extremity swelling with erythema. No differential warmth Neurologic:  Face is symmetric. Moving all extremities. No gross focal neurologic deficits .  Generalized weakness Skin: Skin is warm, dry.  No rash or ulcers Psychiatric: Mood and affect are normal    Labs on Admission: I have personally reviewed following labs and imaging studies  CBC: Recent Labs  Lab 05/21/21 0345 05/21/21 0700  WBC 13.1* 15.2*  NEUTROABS 12.1*  --   HGB 10.1* 9.3*  HCT 33.7* 30.7*  MCV 103.1* 103.0*  PLT 210 580   Basic Metabolic Panel: Recent Labs  Lab 05/21/21 0345  NA 135  K 5.3*  CL 102  CO2 26  GLUCOSE 156*  BUN 51*  CREATININE 5.99*  CALCIUM 7.8*   GFR: CrCl cannot be calculated (Unknown ideal weight.). Liver Function Tests: Recent Labs  Lab 05/21/21 0345  AST 14*  ALT 8  ALKPHOS 102  BILITOT 0.7  PROT 7.9  ALBUMIN 3.0*   No results for input(s): LIPASE, AMYLASE in the last 168 hours. No results for input(s): AMMONIA in the last 168 hours. Coagulation Profile: No results for input(s): INR, PROTIME in the last 168 hours. Cardiac Enzymes: No results for input(s): CKTOTAL, CKMB, CKMBINDEX, TROPONINI in the last 168 hours. BNP (last 3 results) No results for input(s): PROBNP in the last 8760 hours. HbA1C: No results for input(s): HGBA1C in the last 72 hours. CBG: Recent Labs  Lab 05/21/21 0723  GLUCAP 98   Lipid Profile: No results for input(s): CHOL, HDL, LDLCALC, TRIG, CHOLHDL, LDLDIRECT in the last 72 hours. Thyroid Function Tests: No results for input(s): TSH, T4TOTAL, FREET4, T3FREE, THYROIDAB in the last 72 hours. Anemia Panel: No results for input(s): VITAMINB12, FOLATE, FERRITIN, TIBC, IRON, RETICCTPCT in the last 72 hours. Urine analysis:    Component Value Date/Time   COLORURINE RED (A) 06/23/2020  [place] (A) 06/23/2020 1025   APPEARANCEUR Clear 05/03/2014 0503   LABSPEC 1.007 06/23/2020 1025   LABSPEC 1.015 05/03/2014  0503   PHURINE  06/23/2020 1025    TEST NOT REPORTED DUE TO COLOR INTERFERENCE OF URINE PIGMENT   GLUCOSEU (A) 06/23/2020 1025    TEST NOT REPORTED DUE TO COLOR INTERFERENCE OF URINE PIGMENT   GLUCOSEU Negative 05/03/2014 0503   HGBUR (A) 06/23/2020 1025    TEST NOT REPORTED DUE TO COLOR INTERFERENCE OF URINE PIGMENT   BILIRUBINUR (A) 06/23/2020 1025    TEST NOT REPORTED DUE TO COLOR INTERFERENCE OF URINE PIGMENT   BILIRUBINUR Negative 05/03/2014 0503   KETONESUR (A) 06/23/2020 1025    TEST NOT REPORTED DUE TO COLOR INTERFERENCE OF URINE PIGMENT   PROTEINUR (A) 06/23/2020 1025    TEST NOT REPORTED DUE TO COLOR INTERFERENCE OF URINE PIGMENT   UROBILINOGEN 0.2 09/03/2012 1355   NITRITE (A) 06/23/2020 1025    TEST NOT REPORTED DUE TO COLOR INTERFERENCE OF URINE PIGMENT   LEUKOCYTESUR (A) 06/23/2020 1025    TEST NOT REPORTED DUE TO COLOR INTERFERENCE OF URINE PIGMENT   LEUKOCYTESUR Negative 05/03/2014 0503    Radiological Exams on Admission: DG Chest Port 1 View  Result Date: 05/21/2021 CLINICAL DATA:  Shortness of breath EXAM: PORTABLE CHEST 1 VIEW COMPARISON:  01/13/2021 FINDINGS: Moderate cardiomegaly. Mild pulmonary edema. Small pleural effusions. Remote median sternotomy. IMPRESSION: Cardiomegaly and mild pulmonary edema. Electronically Signed   By: Ulyses Jarred M.D.   On: 05/21/2021 03:45     Assessment/Plan Principal Problem:   Acute diastolic CHF (congestive heart failure) (HCC) Active Problems:   Diabetes mellitus (HCC)   CAD (coronary artery disease)   Hyperkalemia   History of CVA with residual deficit   Ambulatory dysfunction   ESRD (end stage renal disease) on dialysis [facility])   Pulmonary edema   Macular degeneration      Acute diastolic dysfunction CHF Secondary to fluid overload from missed dialysis Patient's last 2D echocardiogram from 11/21 shows an LVEF of 60 to 65% Patient requires urgent dialysis Optimize blood pressure control with Continue  hydralazine and metoprolol Consult nephrology       Acute on chronic respiratory failure Secondary to acute diastolic dysfunction CHF At baseline patient wears 2 L of oxygen continuous but is currently on a nonrebreather mask due to hypoxia related to pulmonary edema. Will attempt to wean patient off nonrebreather mask once she completes her renal replacement therapy     Diabetes mellitus with complications of end-stage renal disease on hemodialysis Patient is dialysis days are M/W/F She did not complete her dialysis session on Friday because her AV graft clotted off.  Her PermCath also dislodged several days prior to her admission She now presents in fluid overload with hyperkalemia and requires urgent dialysis Will consult vascular surgery for dialysis access Keep patient n.p.o. Blood sugar checks every 4 hours Hold oral hypoglycemic agents     History of coronary artery disease status post CABG Continue aspirin and metoprolol    Hypothyroidism Continue Synthroid   DVT prophylaxis: Heparin Code Status: DO NOT RESUSCITATE Family Communication: Greater than 50% of time was spent discussing patient's condition and plan of care with her and her husband at the bedside.  All questions and concerns have been addressed.  They verbalized understanding and agree with the plan.  CODE STATUS was discussed and she is a DNR. Disposition Plan: Back to previous home environment Consults called: Nephrology/vascular surgery Status:  At the time of admission, it appears that the appropriate admission status for this patient is inpatient. This is judged to be reasonable and necessary in order to provide the required intensity of service to ensure the patient's safety given the presenting symptoms, physical exam findings, and initial radiographic and laboratory data in the context of their comorbid conditions. Patient requires inpatient status due to high intensity of service, high risk for  further deterioration and high frequency of surveillance required.    Collier Bullock MD [facility]     05/21/2021, 9:37 AM

## 2021-05-21 NOTE — ED Triage Notes (Signed)
Pt in from home via [facility] with sob, generalized weakness. Pt denies any pain on arrival, states she woke up suddenly w/sob this evening. Tachypneic in 30's, wears 2LNC at home. Sats were 64% on EMS arrival. Arrives to ED on NRB, sats 100%. VS otherwise stable, temp 99.5. pt missed dialysis yesterday - last HD was Wednesday

## 2021-05-21 NOTE — ED Notes (Signed)
Pt trialed on 3LNC, sats 96%. Pt lethargic, but oriented x 4

## 2021-05-21 NOTE — ED Notes (Signed)
Informed RN bed assigned 

## 2021-05-21 NOTE — Procedures (Signed)
    PROCEDURE NOTE  PROCEDURE: 1. Left common femoral vein temporary dialysis catheter placement 2. Left common femoral vein cannulation under ultrasound guidance  PRE-OPERATIVE DIAGNOSIS: end-stage renal failure  POST-OPERATIVE DIAGNOSIS: same as above  SURGEON: Adele Barthel, MD  ANESTHESIA:  None  ESTIMATED BLOOD LOSS: minimal  FINDING: 1.  Pulsatile left common femoral vein  SPECIMEN(S):  none  INDICATIONS:   [name] is a 87 y.o. female with ESRD-HD who presents with need for temporary dialysis catheter placement.  The patient is aware the risks of dialysis catheter placement include but are not limited to: bleeding, infection, central venous injury, pneumothorax, possible venous stenosis, possible malpositioning in the venous system, and possible infections related to long-term catheter presence.  The patient was aware of these risks and agreed to proceed.  DESCRIPTION: After written full informed consent was obtained from the patient's husband, the patient was placed supine in his ED stretcher.  The patient was prepped chloraprep and draped in the standard fashion for a left groin venous catheter placement.  I anesthesized the neck cannulation site with 1% lidocaine then under ultrasound guidance, the left femoral vein was cannulated with the 18 gauge needle.  A wire was then placed down in the iliac vein.  I made a stab incision at the cannulation side.  I dilated the skin and venotomy with a dilator.  Over the wire, the Trialysis dialysis catheter was loaded into the superior vena cava.  Each port was aspirated and flushed with sterile normal saline.  The catheter was secured in placed with two interrupted stitches of 3-0 Silk tied to the catheter.  The catheter was dressed with sterile dressing and secured in place with a Tegraderm  COMPLICATIONS: none  CONDITION: stable   Adele Barthel, MD, [facility] [place] Office: [phone] Pager:  [phone]  05/21/2021, 10:24 AM

## 2021-05-21 NOTE — Plan of Care (Signed)

## 2021-05-21 NOTE — Progress Notes (Signed)
[facility]  ROUNDING NOTE   Subjective:   [name] is a 87 y.o. female with diabetes, CAD with CABG, chronic respiratory failure 2L, diastolic CHF, hypertension and ESRD on HD. Patient presents to ED with complaints of shortness of breath. She has been admitted for Acute pulmonary edema (HCC) [J81.0] Pulmonary edema [J81.1] Respiratory distress [R06.03] Hypoxia [R09.02] ESRD (end stage renal disease) on dialysis ([place]) [N18.6, Z99.2] Generalized weakness [G25.6] Acute diastolic CHF (congestive heart failure) ([place]) [I50.31] Community acquired pneumonia, unspecified laterality [J18.9]  Patient is known by this practice and received dialysis at [facility] supervised by Dr Holley Raring. According to records, outpatient clinic believes AVG is infected and Permcath is dislodged. Patient she's felt bad all week. Husband at bedside states she awoke yelling the night prior saying she couldn't breath. She was sent to ED via EMS with sats 60% on room air. She was placed on non-rebreather. Denies nausea, vomiting, and diarrhea. States she has intermittent cough and chronically wears O2 at home, 2L. Respiratory viral panel negative. Chest x-ray shows mild pulmonary edema and cardiiomegaly   Objective:  Vital signs in last 24 hours:  Temp:  [99.5 F (37.5 C)] 99.5 F (37.5 C) (08/20 0334) Pulse Rate:  [44-100] 96 (08/20 0800) Resp:  [20-28] 21 (08/20 0800) BP: (125-162)/(50-65) 125/58 (08/20 0800) SpO2:  [96 %-100 %] 97 % (08/20 0800)  Weight change:  There were no vitals filed for this visit.  Intake/Output: No intake/output data recorded.   Intake/Output this shift:  No intake/output data recorded.  Physical Exam: General: NAD, ill appearing  Head: Normocephalic, atraumatic. Moist oral mucosal membranes  Eyes: Anicteric  Lungs:  Crackles bilaterally, NRB  Heart: Regular rate and rhythm  Abdomen:  Soft, nontender,   Extremities:  3+ peripheral edema. BLE red, warm   Neurologic: Nonfocal, moving all four extremities  Skin: No lesions  Access: Lt AVG, Rt Permcath    Basic Metabolic Panel: Recent Labs  Lab 05/21/21 0345  NA 135  K 5.3*  CL 102  CO2 26  GLUCOSE 156*  BUN 51*  CREATININE 5.99*  CALCIUM 7.8*    Liver Function Tests: Recent Labs  Lab 05/21/21 0345  AST 14*  ALT 8  ALKPHOS 102  BILITOT 0.7  PROT 7.9  ALBUMIN 3.0*   No results for input(s): LIPASE, AMYLASE in the last 168 hours. No results for input(s): AMMONIA in the last 168 hours.  CBC: Recent Labs  Lab 05/21/21 0345 05/21/21 0700  WBC 13.1* 15.2*  NEUTROABS 12.1*  --   HGB 10.1* 9.3*  HCT 33.7* 30.7*  MCV 103.1* 103.0*  PLT 210 180    Cardiac Enzymes: No results for input(s): CKTOTAL, CKMB, CKMBINDEX, TROPONINI in the last 168 hours.  BNP: Invalid input(s): POCBNP  CBG: Recent Labs  Lab 05/21/21 3893  TDSKAJ 68    Microbiology: Results for orders placed or performed during the hospital encounter of 05/21/21  Resp Panel by RT-PCR (Flu A&B, Covid) Nasopharyngeal Swab     Status: None   Collection Time: 05/21/21  3:45 AM   Specimen: Nasopharyngeal Swab; Nasopharyngeal(NP) swabs in vial transport medium  Result Value Ref Range Status   SARS Coronavirus 2 by RT PCR NEGATIVE NEGATIVE Final    Comment: (NOTE) SARS-CoV-2 target nucleic acids are NOT DETECTED.  The SARS-CoV-2 RNA is generally detectable in upper respiratory specimens during the acute phase of infection. The lowest concentration of SARS-CoV-2 viral copies this assay can detect is 138 copies/mL. A negative  result does not preclude SARS-Cov-2 infection and should not be used as the sole basis for treatment or other patient management decisions. A negative result may occur with  improper specimen collection/handling, submission of specimen other than nasopharyngeal swab, presence of viral mutation(s) within the areas targeted by this assay, and inadequate number of viral copies(<138  copies/mL). A negative result must be combined with clinical observations, patient history, and epidemiological information. The expected result is Negative.  Fact Sheet for Patients:  EntrepreneurPulse.com.au  Fact Sheet for Healthcare Providers:  IncredibleEmployment.be  This test is no t yet approved or cleared by the [place] FDA and  has been authorized for detection and/or diagnosis of SARS-CoV-2 by FDA under an Emergency Use Authorization (EUA). This EUA will remain  in effect (meaning this test can be used) for the duration of the COVID-19 declaration under Section 564(b)(1) of the Act, 21 U.S.C.section 360bbb-3(b)(1), unless the authorization is terminated  or revoked sooner.       Influenza A by PCR NEGATIVE NEGATIVE Final   Influenza B by PCR NEGATIVE NEGATIVE Final    Comment: (NOTE) The Xpert Xpress SARS-CoV-2/FLU/RSV plus assay is intended as an aid in the diagnosis of influenza from Nasopharyngeal swab specimens and should not be used as a sole basis for treatment. Nasal washings and aspirates are unacceptable for Xpert Xpress SARS-CoV-2/FLU/RSV testing.  Fact Sheet for Patients: EntrepreneurPulse.com.au  Fact Sheet for Healthcare Providers: IncredibleEmployment.be  This test is not yet approved or cleared by the [place] FDA and has been authorized for detection and/or diagnosis of SARS-CoV-2 by FDA under an Emergency Use Authorization (EUA). This EUA will remain in effect (meaning this test can be used) for the duration of the COVID-19 declaration under Section 564(b)(1) of the Act, 21 U.S.C. section 360bbb-3(b)(1), unless the authorization is terminated or revoked.  Performed at [facility], [address] [place], [place] [address]   Culture, blood (routine x 2)     Status: None (Preliminary result)   Collection Time: 05/21/21  3:45 AM   Specimen: BLOOD   Result Value Ref Range Status   Specimen Description BLOOD RIGHT ASSIST CONTROL  Final   Special Requests   Final    BOTTLES DRAWN AEROBIC AND ANAEROBIC Blood Culture adequate volume   Culture   Final    NO GROWTH < 12 HOURS Performed at [facility], [address], [place], [place] [address]    Report Status PENDING  Incomplete  Culture, blood (routine x 2)     Status: None (Preliminary result)   Collection Time: 05/21/21  4:05 AM   Specimen: BLOOD  Result Value Ref Range Status   Specimen Description BLOOD RIGHT HAND  Final   Special Requests   Final    BOTTLES DRAWN AEROBIC ONLY Blood Culture results may not be optimal due to an inadequate volume of blood received in culture bottles   Culture   Final    NO GROWTH <12 HOURS Performed at [facility], [address] [place], [place] [address]    Report Status PENDING  Incomplete    Coagulation Studies: No results for input(s): LABPROT, INR in the last 72 hours.  Urinalysis: No results for input(s): COLORURINE, LABSPEC, PHURINE, GLUCOSEU, HGBUR, BILIRUBINUR, KETONESUR, PROTEINUR, UROBILINOGEN, NITRITE, LEUKOCYTESUR in the last 72 hours.  Invalid input(s): APPERANCEUR    Imaging: DG Chest Port 1 View  Result Date: 05/21/2021 CLINICAL DATA:  Shortness of breath EXAM: PORTABLE CHEST 1 VIEW COMPARISON:  01/13/2021 FINDINGS:  Moderate cardiomegaly. Mild pulmonary edema. Small pleural effusions. Remote median sternotomy. IMPRESSION: Cardiomegaly and mild pulmonary edema. Electronically Signed   By: Ulyses Jarred M.D.   On: 05/21/2021 03:45     Medications:    doxycycline (VIBRAMYCIN) IV 100 mg (05/21/21 0809)    Chlorhexidine Gluconate Cloth  6 each Topical Q0600   heparin  5,000 Units Subcutaneous Q8H   insulin aspart  0-15 Units Subcutaneous TID WC   insulin aspart  0-5 Units Subcutaneous QHS     Assessment/ Plan:  Ms. KELINA BEAUCHAMP is a 87 y.o.  female with diabetes, CAD with CABG, chronic  respiratory failure 2L, diastolic CHF, hypertension and ESRD on HD. Patient presents to ED with complaints of shortness of breath. She has been admitted for Acute pulmonary edema (HCC) [J81.0] Pulmonary edema [J81.1] Respiratory distress [R06.03] Hypoxia [R09.02] ESRD (end stage renal disease) on dialysis ([place]) [N18.6, Z99.2] Generalized weakness [S56.8] Acute diastolic CHF (congestive heart failure) ([place]) [I50.31] Community acquired pneumonia, unspecified laterality [J18.9]  [facility]/MWF/Lt AVG-Rt Permcath  End stage renal disease on dialysis Will maintain outpatient schedule if possible Received 8 min of treatment yesterday at outpatient clinic. Will dialyze today. Vascular placed Lt femoral temp cath. Will attempt Lt AVG with dialysis to access function.  Next treatment scheduled for Monday  2. Anemia of chronic kidney disease Lab Results  Component Value Date   HGB 9.3 (L) 05/21/2021    Hgb at goal   3. Secondary Hyperparathyroidism:   Lab Results  Component Value Date   PTH 163 (H) 06/25/2020   CALCIUM 7.8 (L) 05/21/2021   CAION 1.11 (L) 11/04/2020   PHOS 4.3 08/05/2020   Calcium not at goal, phosphorus at goal  4. Diastolic heart failure ECHO from 08/05/20 show EF 60-65%  5. Hypertension 146/96 Home regimen includes furosemide, hydralazine, and metoprolol.     LOS: 0 [name] 8/20/20229:09 AM

## 2021-05-21 NOTE — Progress Notes (Signed)
VASCULAR SURGERY CONSULTATION   Requested by: Collier Bullock  Reason for consultation: access for HD    History of Present Illness   [name] is a 87 y.o. (01-Jul-1934) female who presents with cc: feel bad.  Pt's L BC AVF clotted off yesterday at dialysis.  Pt was referred to ED for temporary dialyiss catheter placement.    .  Past Medical History:  Diagnosis Date   Arthritis    Bilateral Macular degeneration    Breast cancer ([place]) 2012   right breast   Breast mass, right    CAD S/P CABG x 4    a. 09/2012 CABG x 4: LIMA to LAD, SVG to Diag, SVG to OM1, SVG to PDA, EVH from bilateral thighs   Cancer ([place])    breast cancer, right side [id number]   Complication of anesthesia    Coronary artery disease 08/22/2012   sees Dr Rockey Situ   Diabetes [facility])    Diastolic dysfunction    a. 06/2020 Echo: EF 60-65%; b. 08/2020 Echo: EF 60-65%, no rwma, Gr1 DD. Nl RV size/fxn.   DOE (dyspnea on exertion)    ESRD (end stage renal disease) ([place])    Gastritis    hx of   GERD (gastroesophageal reflux disease)    Headache(784.0)    migraines   History of breast cancer    39 treatments of radiation. Negative chemo.   History of seasonal allergies    Hyperlipemia    Hypertension    sees Dr. Fulton Reek   Hypothyroidism    Neuropathy    Personal history of radiation therapy    Pneumonia    hx of   PONV (postoperative nausea and vomiting)    Stroke [facility])    2012   Vertigo     Past Surgical History:  Procedure Laterality Date   A/V FISTULAGRAM Left 01/13/2021   Procedure: A/V FISTULAGRAM;  Surgeon: Algernon Huxley, MD;  Location: [place] [facility];  Service: Cardiovascular;  Laterality: Left;   ABDOMINAL HYSTERECTOMY     APPENDECTOMY     AV FISTULA PLACEMENT Left 11/04/2020   Procedure: ARTERIOVENOUS (AV) FISTULA CREATION (BRACHIOCEPHALIC);  Surgeon: Algernon Huxley, MD;  Location: [facility] ORS;  Service: Vascular;  Laterality: Left;   BACK SURGERY     BREAST BIOPSY Right     2012 positive- [facility]   BREAST LUMPECTOMY Right 2012   F/U radiation    BREAST SURGERY     CARDIAC CATHETERIZATION  2014   CAROTID PTA/STENT INTERVENTION Left 06/14/2020   Procedure: CAROTID PTA/STENT INTERVENTION;  Surgeon: Algernon Huxley, MD;  Location: [place] [facility];  Service: Cardiovascular;  Laterality: Left;   CATARACT EXTRACTION W/PHACO Right 08/14/2017   Procedure: CATARACT EXTRACTION PHACO AND INTRAOCULAR LENS PLACEMENT (IOC);  Surgeon: Birder Robson, MD;  Location: [facility] ORS;  Service: Ophthalmology;  Laterality: Right;  [place] 00:43.0 AP% 17.1 CDE 7.37 Fluid Pack lot # [id number]   CATARACT EXTRACTION W/PHACO Left 10/09/2017   Procedure: CATARACT EXTRACTION PHACO AND INTRAOCULAR LENS PLACEMENT (IOC);  Surgeon: Birder Robson, MD;  Location: [facility] ORS;  Service: Ophthalmology;  Laterality: Left;  [place] 00:30.3 AP% 11.0 CDE 3.33 Fluid Pack Lot # [id number]   CORONARY ARTERY BYPASS GRAFT  09/05/2012   Procedure: CORONARY ARTERY BYPASS GRAFTING (CABG);  Surgeon: Rexene Alberts, MD;  Location: [place];  Service: Open Heart Surgery;  Laterality: N/A;  CABG x four, using left internal mammary artery and bilateral greater saphenous  vein harvested endoscopically   DIALYSIS/PERMA CATHETER INSERTION Left 06/25/2020   Procedure: DIALYSIS/PERMA CATHETER INSERTION;  Surgeon: Algernon Huxley, MD;  Location: [place] [facility];  Service: Cardiovascular;  Laterality: Left;   DIALYSIS/PERMA CATHETER INSERTION N/A 08/10/2020   Procedure: DIALYSIS/PERMA CATHETER INSERTION;  Surgeon: Katha Cabal, MD;  Location: [place] [facility];  Service: Cardiovascular;  Laterality: N/A;   DIALYSIS/PERMA CATHETER REMOVAL N/A 08/05/2020   Procedure: DIALYSIS/PERMA CATHETER REMOVAL;  Surgeon: Algernon Huxley, MD;  Location: [place] [facility];  Service: Cardiovascular;  Laterality: N/A;   INTRAOPERATIVE TRANSESOPHAGEAL ECHOCARDIOGRAM  09/05/2012   Procedure: INTRAOPERATIVE TRANSESOPHAGEAL ECHOCARDIOGRAM;  Surgeon:  Rexene Alberts, MD;  Location: [place];  Service: Open Heart Surgery;  Laterality: N/A;   KNEE SURGERY     Bilateral nerve block   LAPAROSCOPIC NISSEN FUNDOPLICATION     OVARIAN CYST REMOVAL       Social History   Socioeconomic History   Marital status: Married    Spouse name: [name] "Bud"   Number of children: 0   Years of education: Not on file   Highest education level: Not on file  Occupational History   Occupation: retired  Tobacco Use   Smoking status: Former    Years: 3.00    Types: Cigarettes    Quit date: 11/13/1988    Years since quitting: 32.5   Smokeless tobacco: Never   Tobacco comments:    At the most 1 pack would last over a week.  Vaping Use   Vaping Use: Never used  Substance and Sexual Activity   Alcohol use: No   Drug use: No   Sexual activity: Not on file  Other Topics Concern   Not on file  Social History Narrative   Lives at home with spouse    Social Determinants of Health   Financial Resource Strain: Not on file  Food Insecurity: Not on file  Transportation Needs: Not on file  Physical Activity: Not on file  Stress: Not on file  Social Connections: Not on file  Intimate Partner Violence: Not on file   Family History  Problem Relation Age of Onset   Heart disease Brother        CABG & stents   Hyperlipidemia Brother    Hypertension Brother    Cancer Father    Heart disease Mother    Breast cancer Cousin    Kidney disease Neg Hx     Current Facility-Administered Medications  Medication Dose Route Frequency Provider Last Rate Last Admin   Chlorhexidine Gluconate Cloth 2 % PADS 6 each  6 each Topical Q0600 Breeze, Shantelle, NP       doxycycline (VIBRAMYCIN) 100 mg in sodium chloride 0.9 % 250 mL IVPB  100 mg Intravenous Once Paulette Blanch, MD 125 mL/hr at 05/21/21 0809 100 mg at 05/21/21 0809   heparin injection 5,000 Units  5,000 Units Subcutaneous Q8H Judd Gaudier V, MD   5,000 Units at 05/21/21 0656   insulin aspart (novoLOG)  injection 0-15 Units  0-15 Units Subcutaneous TID WC Athena Masse, MD       insulin aspart (novoLOG) injection 0-5 Units  0-5 Units Subcutaneous QHS Athena Masse, MD       Current Outpatient Medications  Medication Sig Dispense Refill   ALPRAZolam (XANAX) 0.5 MG tablet Take 0.5 mg by mouth 2 (two) times daily as needed for anxiety.      aspirin 81 MG chewable tablet Chew 81 mg by mouth  every Monday, Wednesday, and Friday.      calcium-vitamin D (OSCAL WITH D) 500-200 MG-UNIT tablet Take 1 tablet by mouth daily.     Cyanocobalamin 1000 MCG SUBL Take 1,000 mcg by mouth daily.      furosemide (LASIX) 20 MG tablet Take 20 mg by mouth daily.     gabapentin (NEURONTIN) 100 MG capsule Take 100 mg by mouth 2 (two) times daily.     gabapentin (NEURONTIN) 100 MG capsule Take 1 capsule by mouth 2 (two) times daily.     glimepiride (AMARYL) 4 MG tablet Take 4 mg by mouth daily with breakfast.     hydrALAZINE (APRESOLINE) 25 MG tablet Take 25 mg by mouth 3 (three) times daily.     HYDROcodone-acetaminophen (NORCO/VICODIN) 5-325 MG tablet Take 1 tablet by mouth every 6 (six) hours as needed for moderate pain. 30 tablet 0   levothyroxine (SYNTHROID) 150 MCG tablet Take by mouth.     levothyroxine (SYNTHROID, LEVOTHROID) 150 MCG tablet Take 150 mcg daily before breakfast by mouth.     metoprolol tartrate (LOPRESSOR) 50 MG tablet Take 50 mg by mouth in the morning, at noon, and at bedtime.     OXYGEN Inhale 2 L into the lungs daily.     sulfamethoxazole-trimethoprim (BACTRIM DS) 800-160 MG tablet Take 1 tablet by mouth 2 (two) times daily.      Allergies  Allergen Reactions   Clonidine Other (See Comments) and Shortness Of Breath    Other reaction(s): Other (see comments) Other reaction(s): Other (See Comments)   Darvocet [Propoxyphene N-Acetaminophen] Anaphylaxis   Hydralazine     Other reaction(s): Other (See Comments) glomerulonephritis   Hydralazine Hcl Other (See Comments)     glomerulonephritis   Esomeprazole     Other reaction(s): Unknown   Guaifenesin     Other reaction(s): Unknown Other reaction(s): Unknown Other reaction(s): Unknown Other reaction(s): Unknown   Phenylephrine-Guaifenesin     Other reaction(s): Unknown   Rabeprazole     Other reaction(s): Unknown   Telithromycin     Other reaction(s): Unknown Other reaction(s): Unknown Other reaction(s): Unknown   Fluocinolone Other (See Comments)    Feels crazy Other reaction(s): Other (see comments) Other reaction(s): Other (See Comments) Feels crazy Feels crazy   Iodinated Diagnostic Agents Nausea And Vomiting and Other (See Comments)    Burning Other reaction(s): Other (see comments) Other reaction(s): Other (See Comments) Burning Nausea & vomiting   Levaquin [Levofloxacin] Nausea Only   Statins Other (See Comments)    Myalgia Other reaction(s): Other (See Comments), Unknown, Unknown Myalgia      Physical Examination     Vitals:   05/21/21 0337 05/21/21 0343 05/21/21 0430 05/21/21 0800  BP:   (!) 138/50 (!) 125/58  Pulse:   100 96  Resp:   20 (!) 21  Temp:      TempSrc:      SpO2: 100% 96% 100% 97%    General Alert, O x 3, Cachectic, Ill appearing  Pulmonary Sym exp, good B air movt, rales on B lungs  Cardiac RRR, Nl S1, S2, no Murmurs, No rubs, No S3,S4  Vascular no thrill or bruit L arm    Laboratory   CBC CBC Latest Ref Rng & Units 05/21/2021 05/21/2021 11/04/2020  WBC 4.0 - 10.5 K/uL 15.2(H) 13.1(H) -  Hemoglobin 12.0 - 15.0 g/dL 9.3(L) 10.1(L) 13.9  Hematocrit 36.0 - 46.0 % 30.7(L) 33.7(L) 41.0  Platelets 150 - 400 K/uL 180 210 -    BMP  BMP Latest Ref Rng & Units 05/21/2021 11/04/2020 08/10/2020  Glucose 70 - 99 mg/dL 156(H) 68(L) 101(H)  BUN 8 - 23 mg/dL 51(H) 36(H) 58(H)  Creatinine 0.44 - 1.00 mg/dL 5.99(H) 4.90(H) 6.31(H)  BUN/Creat Ratio 11 - 26 - - -  Sodium 135 - 145 mmol/L 135 137 134(L)  Potassium 3.5 - 5.1 mmol/L 5.3(H) 5.5(H) 4.3  Chloride 98 - 111  mmol/L 102 103 96(L)  CO2 22 - 32 mmol/L 26 - 27  Calcium 8.9 - 10.3 mg/dL 7.8(L) - 6.9(L)    Coagulation Lab Results  Component Value Date   INR 1.0 06/27/2020   INR 1.0 06/23/2020   INR 0.9 04/23/2020   No results found for: PTT  Lipids    Component Value Date/Time   CHOL 154 09/09/2012 0344   TRIG 157 (H) 09/09/2012 0344   HDL 31 (L) 09/09/2012 0344   CHOLHDL 5.0 09/09/2012 0344   VLDL 31 09/09/2012 0344   LDLCALC 92 09/09/2012 0344     Radiology     DG Chest Port 1 View  Result Date: 05/21/2021 CLINICAL DATA:  Shortness of breath EXAM: PORTABLE CHEST 1 VIEW COMPARISON:  01/13/2021 FINDINGS: Moderate cardiomegaly. Mild pulmonary edema. Small pleural effusions. Remote median sternotomy. IMPRESSION: Cardiomegaly and mild pulmonary edema. Electronically Signed   By: Ulyses Jarred M.D.   On: 05/21/2021 03:45     Medical Decision Making   [name] is a 88 y.o. female who presents with: multisystem fluid overload, ESRD-HD  Pt needs temporary dialysis catheter placement The patient is aware the risks of dialysis catheter placement include but are not limited to: bleeding, infection, central venous injury, pneumothorax, possible venous stenosis, possible malpositioning in the venous system, and possible infections related to long-term catheter presence.  The patient was aware of these risks and agreed to proceed. Thank you for allowing [place] to participate in this patient's care.   Adele Barthel, MD, FACS, FSVS Covering for [facility]: [phone]  05/21/2021, 9:27 AM

## 2021-05-21 NOTE — Progress Notes (Signed)
Patient dialysis treatment as ordered. 2 Liters removed over 3 hours, patient tolerated well. Vital signs remained stable throughout treatment. Report given to floor nurse Fredderick Phenix, RN.

## 2021-05-21 NOTE — ED Provider Notes (Addendum)
[facility] Emergency Department Provider Note   ____________________________________________   Event Date/Time   First MD Initiated Contact with Patient 05/21/21 [phone]     (approximate)  I have reviewed the triage vital signs and the nursing notes.   HISTORY  Chief Complaint Shortness of Breath and Weakness    HPI [name] is a 87 y.o. female brought to the ED via EMS from home with a chief complaint of respiratory distress.  EMS was called for a "sick call".  Patient has a history of ESRD on HD M/W/F, CAD, GERD, diabetes who is on 2 L continuous oxygenation.  She presented to dialysis yesterday; however, the staff had difficulty accessing her graft so she did not get dialyzed.  EMS reports saturations in the mid 60%'s.  Patient arrives to the ED on nonrebreather oxygen.  Endorses cough.  Denies fever, chest pain, abdominal pain, nausea or vomiting.  Still urinates small amounts daily.      Past Medical History:  Diagnosis Date   Arthritis    Bilateral Macular degeneration    Breast cancer ([place]) 2012   right breast   Breast mass, right    CAD S/P CABG x 4    a. 09/2012 CABG x 4: LIMA to LAD, SVG to Diag, SVG to OM1, SVG to PDA, EVH from bilateral thighs   Cancer ([place])    breast cancer, right side [id number]   Complication of anesthesia    Coronary artery disease 08/22/2012   sees Dr Rockey Situ   Diabetes [facility])    Diastolic dysfunction    a. 06/2020 Echo: EF 60-65%; b. 08/2020 Echo: EF 60-65%, no rwma, Gr1 DD. Nl RV size/fxn.   DOE (dyspnea on exertion)    ESRD (end stage renal disease) ([place])    Gastritis    hx of   GERD (gastroesophageal reflux disease)    Headache(784.0)    migraines   History of breast cancer    39 treatments of radiation. Negative chemo.   History of seasonal allergies    Hyperlipemia    Hypertension    sees Dr. Fulton Reek   Hypothyroidism    Neuropathy    Personal history of radiation therapy    Pneumonia    hx  of   PONV (postoperative nausea and vomiting)    Stroke ([place])    2012   Vertigo     Patient Active Problem List   Diagnosis Date Noted   Pulmonary edema 05/21/2021   Swelling of limb 03/29/2021   Lower limb ulcer, ankle, right, limited to breakdown of skin ([place]) 03/08/2021   Bacteremia due to Staphylococcus 08/11/2020   Pressure injury of skin 08/05/2020   Wound infection 08/04/2020   History of CVA with residual deficit 08/04/2020   Ambulatory dysfunction 08/04/2020   Anemia of chronic kidney failure, stage 5 ([place]) 08/04/2020   ESRD on hemodialysis ([place]) 07/16/2020   ESRD (end stage renal disease) on dialysis ([place]) 07/14/2020   Symptomatic anemia 07/01/2020   Hyperkalemia 06/22/2020   Prolonged QT interval 06/22/2020   Stroke ([place]) 06/01/2020   CAD (coronary artery disease) 06/01/2020   Thyroid disease 06/01/2020   CKD (chronic kidney disease) stage 5, GFR less than 15 ml/min ([place]) 06/01/2020   Carotid stenosis 06/01/2020   Pain syndrome, chronic 02/16/2020   Weakness 08/30/2016   Acute renal failure (ARF) ([place]) 05/04/2016   Type 2 diabetes with nephropathy ([place]) 09/02/2015   HTN, goal below 140/80 08/20/2015   Diabetes mellitus type  2, uncomplicated ([place]) 81/19/1478   Cerebral embolism with cerebral infarction ([place]) 09/07/2012   Hemiplegia, unspecified, affecting dominant side 09/07/2012   S/P CABG x 4 09/05/2012   Coronary artery disease 08/22/2012   Hyperlipidemia 08/19/2012   Diabetes mellitus ([place]) 08/19/2012   Hypertension 08/19/2012   Breast cancer ([place]) 08/19/2012   GERD (gastroesophageal reflux disease) 08/19/2012    Past Surgical History:  Procedure Laterality Date   A/V FISTULAGRAM Left 01/13/2021   Procedure: A/V FISTULAGRAM;  Surgeon: Algernon Huxley, MD;  Location: [place] [facility];  Service: Cardiovascular;  Laterality: Left;   ABDOMINAL HYSTERECTOMY     APPENDECTOMY     AV FISTULA PLACEMENT Left 11/04/2020   Procedure: ARTERIOVENOUS (AV) FISTULA  CREATION (BRACHIOCEPHALIC);  Surgeon: Algernon Huxley, MD;  Location: [facility] OR[facility];  Service: Vascular;  Laterality: Left;   BACK SURGERY     BREAST BIOPSY Right    2012 positive- [facility]   BREAST LUMPECTOMY Right 2012   F/U radiation    BREAST SURGERY     CARDIAC CATHETERIZATION  2014   CAROTID PTA/STENT INTERVENTION Left 06/14/2020   Procedure: CAROTID PTA/STENT INTERVENTION;  Surgeon: Algernon Huxley, MD;  Location: [place] [facility];  Service: Cardiovascular;  Laterality: Left;   CATARACT EXTRACTION W/PHACO Right 08/14/2017   Procedure: CATARACT EXTRACTION PHACO AND INTRAOCULAR LENS PLACEMENT (IOC);  Surgeon: Birder Robson, MD;  Location: [facility] OR[facility];  Service: Ophthalmology;  Laterality: Right;  [place] 00:43.0 AP% 17.1 CDE 7.37 Fluid Pack lot # [id number]   CATARACT EXTRACTION W/PHACO Left 10/09/2017   Procedure: CATARACT EXTRACTION PHACO AND INTRAOCULAR LENS PLACEMENT (IOC);  Surgeon: Birder Robson, MD;  Location: [facility] OR[facility];  Service: Ophthalmology;  Laterality: Left;  [place] 00:30.3 AP% 11.0 CDE 3.33 Fluid Pack Lot # [id number]   CORONARY ARTERY BYPASS GRAFT  09/05/2012   Procedure: CORONARY ARTERY BYPASS GRAFTING (CABG);  Surgeon: Rexene Alberts, MD;  Location: [place];  Service: Open Heart Surgery;  Laterality: N/A;  CABG x four, using left internal mammary artery and bilateral greater saphenous vein harvested endoscopically   DIALYSIS/PERMA CATHETER INSERTION Left 06/25/2020   Procedure: DIALYSIS/PERMA CATHETER INSERTION;  Surgeon: Algernon Huxley, MD;  Location: [place] [facility];  Service: Cardiovascular;  Laterality: Left;   DIALYSIS/PERMA CATHETER INSERTION N/A 08/10/2020   Procedure: DIALYSIS/PERMA CATHETER INSERTION;  Surgeon: Katha Cabal, MD;  Location: [place] [facility];  Service: Cardiovascular;  Laterality: N/A;   DIALYSIS/PERMA CATHETER REMOVAL N/A 08/05/2020   Procedure: DIALYSIS/PERMA CATHETER REMOVAL;  Surgeon: Algernon Huxley, MD;  Location: [place] [facility];  Service:  Cardiovascular;  Laterality: N/A;   INTRAOPERATIVE TRANSESOPHAGEAL ECHOCARDIOGRAM  09/05/2012   Procedure: INTRAOPERATIVE TRANSESOPHAGEAL ECHOCARDIOGRAM;  Surgeon: Rexene Alberts, MD;  Location: [place];  Service: Open Heart Surgery;  Laterality: N/A;   KNEE SURGERY     Bilateral nerve block   LAPAROSCOPIC NISSEN FUNDOPLICATION     OVARIAN CYST REMOVAL      Prior to Admission medications   Medication Sig Start Date End Date Taking? Authorizing Provider  ALPRAZolam Duanne Moron) 0.5 MG tablet Take 0.5 mg by mouth 2 (two) times daily as needed for anxiety.     [provider]  aspirin 81 MG chewable tablet Chew 81 mg by mouth every Monday, Wednesday, and Friday.     [provider]  calcium-vitamin D (OSCAL WITH D) 500-200 MG-UNIT tablet Take 1 tablet by mouth daily.    [provider]  Cyanocobalamin 1000 MCG SUBL Take 1,000 mcg  by mouth daily.     [provider]  furosemide (LASIX) 20 MG tablet Take 20 mg by mouth daily. 04/06/21   [provider]  gabapentin (NEURONTIN) 100 MG capsule Take 100 mg by mouth 2 (two) times daily. 09/23/12   Love, Ivan Anchors, PA-C  gabapentin (NEURONTIN) 100 MG capsule Take 1 capsule by mouth 2 (two) times daily. 04/11/21   [provider]  glimepiride (AMARYL) 4 MG tablet Take 4 mg by mouth daily with breakfast.    [provider]  hydrALAZINE (APRESOLINE) 25 MG tablet Take 25 mg by mouth 3 (three) times daily.    [provider]  HYDROcodone-acetaminophen (NORCO/VICODIN) 5-325 MG tablet Take 1 tablet by mouth every 6 (six) hours as needed for moderate pain. 11/04/20   Algernon Huxley, MD  levothyroxine (SYNTHROID) 150 MCG tablet Take by mouth. 04/19/21   [provider]  levothyroxine (SYNTHROID, LEVOTHROID) 150 MCG tablet Take 150 mcg daily before breakfast by mouth.    [provider]  metoprolol tartrate (LOPRESSOR) 50 MG tablet Take 50 mg by mouth in the morning, at noon, and at bedtime.     [provider]  OXYGEN Inhale 2 L into the lungs daily.    [provider]  sulfamethoxazole-trimethoprim (BACTRIM DS) 800-160 MG tablet Take 1 tablet by mouth 2 (two) times daily. 04/19/21   [provider]    Allergies Clonidine, Darvocet [propoxyphene n-acetaminophen], Hydralazine, Hydralazine hcl, Esomeprazole, Guaifenesin, Phenylephrine-guaifenesin, Rabeprazole, Telithromycin, Fluocinolone, Iodinated diagnostic agents, Levaquin [levofloxacin], and Statins  Family History  Problem Relation Age of Onset   Heart disease Brother        CABG & stents   Hyperlipidemia Brother    Hypertension Brother    Cancer Father    Heart disease Mother    Breast cancer Cousin    Kidney disease Neg Hx     Social History Social History   Tobacco Use   Smoking status: Former    Years: 3.00    Types: Cigarettes    Quit date: 11/13/1988    Years since quitting: 32.5   Smokeless tobacco: Never   Tobacco comments:    At the most 1 pack would last over a week.  Vaping Use   Vaping Use: Never used  Substance Use Topics   Alcohol use: No   Drug use: No    Review of Systems  Constitutional: No fever/chills Eyes: No visual changes. ENT: No sore throat. Cardiovascular: Denies chest pain. Respiratory: Positive for cough and shortness of breath. Gastrointestinal: No abdominal pain.  No nausea, no vomiting.  No diarrhea.  No constipation. Genitourinary: Negative for dysuria. Musculoskeletal: Negative for back pain. Skin: Negative for rash. Neurological: Negative for headaches, focal weakness or numbness.   ____________________________________________   PHYSICAL EXAM:  VITAL SIGNS: ED Triage Vitals  Enc Vitals Group     BP      Pulse      Resp      Temp      Temp src      SpO2      Weight      Height      Head Circumference      Peak Flow      Pain Score      Pain Loc      Pain Edu?      Excl. in [place]?     Constitutional: Alert and oriented.   Elderly appearing and in moderate acute distress. Eyes: Conjunctivae are normal.  PERRL. EOMI. Head: Atraumatic. Nose: No congestion/rhinnorhea. Mouth/Throat: Mucous membranes are mildly dry. Neck: No stridor.   Cardiovascular: Tachycardic rate, regular rhythm. Grossly normal heart sounds.  Good peripheral circulation. Respiratory: Increased respiratory effort.  No retractions. Lungs with bibasilar rales. Gastrointestinal: Soft and nontender to light or deep palpation. No distention. No abdominal bruits. No CVA tenderness. Musculoskeletal: No lower extremity tenderness.  2+ BLE pitting edema.  No joint effusions.  Right ankle bandaged. Neurologic:  Normal speech and language. No gross focal neurologic deficits are appreciated.  Skin:  Skin is warm, dry and intact. No rash noted. Psychiatric: Mood and affect are normal. Speech and behavior are normal.  ____________________________________________   LABS (all labs ordered are listed, but only abnormal results are displayed)  Labs Reviewed  CBC WITH DIFFERENTIAL/PLATELET - Abnormal; Notable for the following components:      Result Value   WBC 13.1 (*)    RBC 3.27 (*)    Hemoglobin 10.1 (*)    HCT 33.7 (*)    MCV 103.1 (*)    Neutro Abs 12.1 (*)    Lymphs Abs 0.3 (*)    All other components within normal limits  COMPREHENSIVE METABOLIC PANEL - Abnormal; Notable for the following components:   Potassium 5.3 (*)    Glucose, Bld 156 (*)    BUN 51 (*)    Creatinine, Ser 5.99 (*)    Calcium 7.8 (*)    Albumin 3.0 (*)    AST 14 (*)    GFR, Estimated 6 (*)    All other components within normal limits  BRAIN NATRIURETIC PEPTIDE - Abnormal; Notable for the following components:   B Natriuretic Peptide 1,955.9 (*)    All other components within normal limits  LACTIC ACID, PLASMA - Abnormal; Notable for the following components:   Lactic Acid, Venous 2.0 (*)    All other components within normal limits  RESP PANEL BY RT-PCR (FLU A&B,  COVID) ARPGX2  CULTURE, BLOOD (ROUTINE X 2)  CULTURE, BLOOD (ROUTINE X 2)  PROCALCITONIN  LACTIC ACID, PLASMA  HEMOGLOBIN A1C  CBC  TROPONIN I (HIGH SENSITIVITY)  TROPONIN I (HIGH SENSITIVITY)   ____________________________________________  EKG  ED ECG REPORT I, Elona Yinger J, the attending physician, personally viewed and interpreted this ECG.   Date: 05/21/2021  EKG Time: 0331  Rate: 106  Rhythm: sinus tachycardia  Axis: LAD  Intervals:none  ST&T Change: Nonspecific  ____________________________________________  RADIOLOGY Cecilie Kicks J, personally viewed and evaluated these images (plain radiographs) as part of my medical decision making, as well as reviewing the written report by the radiologist.  ED MD interpretation: Pulmonary edema  Official radiology report(s): DG Chest Port 1 View  Result Date: 05/21/2021 CLINICAL DATA:  Shortness of breath EXAM: PORTABLE CHEST 1 VIEW COMPARISON:  01/13/2021 FINDINGS: Moderate cardiomegaly. Mild pulmonary edema. Small pleural effusions. Remote median sternotomy. IMPRESSION: Cardiomegaly and mild pulmonary edema. Electronically Signed   By: Ulyses Jarred M.D.   On: 05/21/2021 03:45    ____________________________________________   PROCEDURES  Procedure(s) performed (including Critical Care):  .1-3 Lead EKG Interpretation  Date/Time: 05/21/2021 3:45 AM Performed by: Paulette Blanch, MD Authorized by: Paulette Blanch, MD     Interpretation: abnormal     ECG rate:  106   ECG rate assessment: tachycardic     Rhythm: sinus tachycardia     Ectopy: none     Conduction: normal   Comments:     Patient placed on cardiac monitor to evaluate for  arrhythmias  CRITICAL CARE Performed by: Paulette Blanch   Total critical care time: 45 minutes  Critical care time was exclusive of separately billable procedures and treating other patients.  Critical care was necessary to treat or prevent imminent or life-threatening  deterioration.  Critical care was time spent personally by me on the following activities: development of treatment plan with patient and/or surrogate as well as nursing, discussions with consultants, evaluation of patient's response to treatment, examination of patient, obtaining history from patient or surrogate, ordering and performing treatments and interventions, ordering and review of laboratory studies, ordering and review of radiographic studies, pulse oximetry and re-evaluation of patient's condition.    ____________________________________________   INITIAL IMPRESSION / ASSESSMENT AND PLAN / ED COURSE  As part of my medical decision making, I reviewed the following data within the [facility] notes reviewed and incorporated, Labs reviewed, EKG interpreted, Old chart reviewed, Radiograph reviewed, Discussed with admitting physician, and Notes from prior ED visits     87 year old female presenting in respiratory distress with hypoxia, was not dialyzed yesterday. Differential includes, but is not limited to, viral syndrome, bronchitis including COPD exacerbation, pneumonia, reactive airway disease including asthma, CHF including exacerbation with or without pulmonary/interstitial edema, pneumothorax, ACS, thoracic trauma, and pulmonary embolism.   Saturations 100% on nonrebreather oxygen.  Will obtain cardiac panel, chest x-ray, COVID swab.  Consider IV Lasix.  Consider BiPAP if respiratory status worsens.  Anticipate hospitalization..   Clinical Course as of 05/21/21 0709  Sat May 21, 2021  0425 Chest x-ray demonstrates pulmonary edema; will administer IV Lasix. [JS]  0706 Noted elevated procalcitonin; will initiate IV antibiotics for community-acquired pneumonia. [JS]    Clinical Course User Index [JS] Paulette Blanch, MD     ____________________________________________   FINAL CLINICAL IMPRESSION(S) / ED DIAGNOSES  Final diagnoses:  Generalized  weakness  Respiratory distress  Hypoxia  Acute pulmonary edema (HCC)  ESRD (end stage renal disease) on dialysis [facility])  Community acquired pneumonia, unspecified laterality     ED Discharge Orders     None        Note:  This document was prepared using Dragon voice recognition software and may include unintentional dictation errors.    Paulette Blanch, MD 05/21/21 7340    Paulette Blanch, MD 05/21/21 [id number]

## 2021-05-21 NOTE — ED Notes (Signed)
Purewick applied.

## 2021-05-22 DIAGNOSIS — I5031 Acute diastolic (congestive) heart failure: Secondary | ICD-10-CM

## 2021-05-22 LAB — CBC
HCT: 32.3 % — ABNORMAL LOW (ref 36.0–46.0)
Hemoglobin: 9.1 g/dL — ABNORMAL LOW (ref 12.0–15.0)
MCH: 30.6 pg (ref 26.0–34.0)
MCHC: 28.2 g/dL — ABNORMAL LOW (ref 30.0–36.0)
MCV: 108.8 fL — ABNORMAL HIGH (ref 80.0–100.0)
Platelets: 131 10*3/uL — ABNORMAL LOW (ref 150–400)
RBC: 2.97 MIL/uL — ABNORMAL LOW (ref 3.87–5.11)
RDW: 15.6 % — ABNORMAL HIGH (ref 11.5–15.5)
WBC: 9.8 10*3/uL (ref 4.0–10.5)
nRBC: 0 % (ref 0.0–0.2)

## 2021-05-22 LAB — BASIC METABOLIC PANEL
Anion gap: 9 (ref 5–15)
BUN: 32 mg/dL — ABNORMAL HIGH (ref 8–23)
CO2: 25 mmol/L (ref 22–32)
Calcium: 7.8 mg/dL — ABNORMAL LOW (ref 8.9–10.3)
Chloride: 100 mmol/L (ref 98–111)
Creatinine, Ser: 4.07 mg/dL — ABNORMAL HIGH (ref 0.44–1.00)
GFR, Estimated: 10 mL/min — ABNORMAL LOW (ref >60)
Glucose, Bld: 84 mg/dL (ref 70–99)
Potassium: 4.5 mmol/L (ref 3.5–5.1)
Sodium: 134 mmol/L — ABNORMAL LOW (ref 135–145)

## 2021-05-22 LAB — BLOOD CULTURE ID PANEL (REFLEXED) - BCID2

## 2021-05-22 LAB — GLUCOSE, CAPILLARY
Glucose-Capillary: 84 mg/dL (ref 70–99)
Glucose-Capillary: 103 mg/dL — ABNORMAL HIGH (ref 70–99)
Glucose-Capillary: 78 mg/dL (ref 70–99)
Glucose-Capillary: 94 mg/dL (ref 70–99)

## 2021-05-22 LAB — HEPATITIS B SURFACE ANTIGEN: Hepatitis B Surface Ag: NONREACTIVE

## 2021-05-22 MED ORDER — BACITRACIN-NEOMYCIN-POLYMYXIN 400-5-5000 EX OINT
TOPICAL_OINTMENT | Freq: Two times a day (BID) | CUTANEOUS | Status: DC
  Filled 2021-05-22 (×7): qty 1

## 2021-05-22 MED ORDER — CALCIUM CARBONATE ANTACID 500 MG PO CHEW
2.0000 | CHEWABLE_TABLET | Freq: Three times a day (TID) | ORAL | Status: DC
  Administered 2021-05-22 – 2021-05-25 (×6): 400 mg via ORAL
  Filled 2021-05-22 (×7): qty 2

## 2021-05-22 MED ORDER — SODIUM CHLORIDE 0.9 % IV SOLN
1.0000 g | INTRAVENOUS | Status: DC
  Administered 2021-05-22 – 2021-05-24 (×3): 1 g via INTRAVENOUS
  Filled 2021-05-22 (×4): qty 1

## 2021-05-22 MED ORDER — CLOPIDOGREL BISULFATE 75 MG PO TABS
75.0000 mg | ORAL_TABLET | Freq: Every day | ORAL | Status: DC
  Administered 2021-05-22 – 2021-05-25 (×3): 75 mg via ORAL
  Filled 2021-05-22 (×3): qty 1

## 2021-05-22 MED ORDER — TORSEMIDE 20 MG PO TABS
100.0000 mg | ORAL_TABLET | Freq: Once | ORAL | Status: AC
  Administered 2021-05-22: 100 mg via ORAL
  Filled 2021-05-22: qty 5

## 2021-05-22 NOTE — Progress Notes (Addendum)
Pharmacy Antibiotic Note  [name] is a 87 y.o. female admitted on 05/21/2021 with  Acute diastolic CHF. Lab reports Bcx 2 of 4 GNR. BCID did not identify species. Pharmacy has been consulted for Cefepime dosing. Patient has PMH of ESRD on HD.   Plan: Start Cefepime 1g IV every 24 hours   Height: 5\' 4"  (162.6 cm) Weight: 82.4 kg (181 lb 10.5 oz) IBW/kg (Calculated) : 54.7  Temp (24hrs), Avg:98 F (36.7 C), Min:97.6 F (36.4 C), Max:98.9 F (37.2 C)  Recent Labs  Lab 05/21/21 0345 05/21/21 0700 05/22/21 0450  WBC 13.1* 15.2* 9.8  CREATININE 5.99*  --  4.07*  LATICACIDVEN 2.0* 2.1*  --     Estimated Creatinine Clearance: 10.7 mL/min (A) (by C-G formula based on SCr of 4.07 mg/dL (H)).    Allergies  Allergen Reactions   Clonidine Other (See Comments) and Shortness Of Breath    Other reaction(s): Other (see comments) Other reaction(s): Other (See Comments)   Darvocet [Propoxyphene N-Acetaminophen] Anaphylaxis   Hydralazine     Other reaction(s): Other (See Comments) glomerulonephritis   Hydralazine Hcl Other (See Comments)    glomerulonephritis   Esomeprazole     Other reaction(s): Unknown   Guaifenesin     Other reaction(s): Unknown Other reaction(s): Unknown Other reaction(s): Unknown Other reaction(s): Unknown   Phenylephrine-Guaifenesin     Other reaction(s): Unknown   Rabeprazole     Other reaction(s): Unknown   Telithromycin     Other reaction(s): Unknown Other reaction(s): Unknown Other reaction(s): Unknown   Fluocinolone Other (See Comments)    Feels crazy Other reaction(s): Other (see comments) Other reaction(s): Other (See Comments) Feels crazy Feels crazy   Iodinated Diagnostic Agents Nausea And Vomiting and Other (See Comments)    Burning Other reaction(s): Other (see comments) Other reaction(s): Other (See Comments) Burning Nausea & vomiting   Levaquin [Levofloxacin] Nausea Only   Statins Other (See Comments)    Myalgia Other  reaction(s): Other (See Comments), Unknown, Unknown Myalgia    Antimicrobials this admission: 8/20 CTX x 1 8/20 Doxy x1  8/21 Cefepime>>    Microbiology results: 8/20 BCx: 2 of 4 GNR   Thank you for allowing pharmacy to be a part of this patient's care.  Pernell Dupre, PharmD, BCPS Clinical Pharmacist 05/22/2021 6:56 PM

## 2021-05-22 NOTE — Progress Notes (Signed)
.  PHARMACY - PHYSICIAN COMMUNICATION CRITICAL VALUE ALERT - BLOOD CULTURE IDENTIFICATION (BCID)  [name] is an 87 y.o. female who presented to [facility] on 05/21/2021 with Acute diastolic CHF.  Assessment:  Lab reports Bcx 2 of 4 GNR. BCID did not identify species. Discussed with provider that possible species could include Morganella or Citrobacter.   Name of physician (or Provider) Contacted: Dr. Si Raider  Current antibiotics: None  Changes to prescribed antibiotics recommended:  Started Cefepime 1g IV daily   MD to consult ID Monday   Results for orders placed or performed during the hospital encounter of 05/21/21  Blood Culture ID Panel (Reflexed) (Collected: 05/21/2021  3:45 AM)  Result Value Ref Range   Enterococcus faecalis NOT DETECTED NOT DETECTED   Enterococcus Faecium NOT DETECTED NOT DETECTED   Listeria monocytogenes NOT DETECTED NOT DETECTED   Staphylococcus species NOT DETECTED NOT DETECTED   Staphylococcus aureus (BCID) NOT DETECTED NOT DETECTED   Staphylococcus epidermidis NOT DETECTED NOT DETECTED   Staphylococcus lugdunensis NOT DETECTED NOT DETECTED   Streptococcus species NOT DETECTED NOT DETECTED   Streptococcus agalactiae NOT DETECTED NOT DETECTED   Streptococcus pneumoniae NOT DETECTED NOT DETECTED   Streptococcus pyogenes NOT DETECTED NOT DETECTED   A.calcoaceticus-baumannii NOT DETECTED NOT DETECTED   Bacteroides fragilis NOT DETECTED NOT DETECTED   Enterobacterales NOT DETECTED NOT DETECTED   Enterobacter cloacae complex NOT DETECTED NOT DETECTED   Escherichia coli NOT DETECTED NOT DETECTED   Klebsiella aerogenes NOT DETECTED NOT DETECTED   Klebsiella oxytoca NOT DETECTED NOT DETECTED   Klebsiella pneumoniae NOT DETECTED NOT DETECTED   Proteus species NOT DETECTED NOT DETECTED   Salmonella species NOT DETECTED NOT DETECTED   Serratia marcescens NOT DETECTED NOT DETECTED   Haemophilus influenzae NOT DETECTED NOT DETECTED   Neisseria meningitidis  NOT DETECTED NOT DETECTED   Pseudomonas aeruginosa NOT DETECTED NOT DETECTED   Stenotrophomonas maltophilia NOT DETECTED NOT DETECTED   Candida albicans NOT DETECTED NOT DETECTED   Candida auris NOT DETECTED NOT DETECTED   Candida glabrata NOT DETECTED NOT DETECTED   Candida krusei NOT DETECTED NOT DETECTED   Candida parapsilosis NOT DETECTED NOT DETECTED   Candida tropicalis NOT DETECTED NOT DETECTED   Cryptococcus neoformans/gattii NOT DETECTED NOT DETECTED    Pernell Dupre, PharmD, BCPS Clinical Pharmacist 05/22/2021 5:46 PM

## 2021-05-22 NOTE — Progress Notes (Signed)
      Daily Progress Note   Assessment/Planning:   POD #1 s/p L fem temp dialysis catheter placement  HD via L fem TDC completed Will add pt on Dr. Bunnie Domino schedule for L BC AVF percutaneous thrombectomy, possible tunneled dialysis catheter placement Keep NPO after midnight   Subjective  - * No surgery found *   Breathing better   Objective   Vitals:   05/21/21 2045 05/22/21 0354 05/22/21 0500 05/22/21 0756  BP: (!) 109/45 (!) 136/56  (!) 138/58  Pulse: 88 81  86  Resp: 20 18  18   Temp: 97.9 F (36.6 C) 97.7 F (36.5 C)  97.8 F (36.6 C)  TempSrc:  Oral    SpO2: 100% 100%  100%  Weight:   82.4 kg   Height:         Intake/Output Summary (Last 24 hours) at 05/22/2021 8413 Last data filed at 05/22/2021 0600 Gross per 24 hour  Intake --  Output 2100 ml  Net -2100 ml    VASC No bleeding from exit site in L groin    Laboratory   CBC CBC Latest Ref Rng & Units 05/22/2021 05/21/2021 05/21/2021  WBC 4.0 - 10.5 K/uL 9.8 15.2(H) 13.1(H)  Hemoglobin 12.0 - 15.0 g/dL 9.1(L) 9.3(L) 10.1(L)  Hematocrit 36.0 - 46.0 % 32.3(L) 30.7(L) 33.7(L)  Platelets 150 - 400 K/uL 131(L) 180 210    BMET    Component Value Date/Time   NA 134 (L) 05/22/2021 0450   NA 140 11/22/2015 0830   NA 142 05/07/2014 0516   K 4.5 05/22/2021 0450   K 3.2 (L) 05/07/2014 0516   CL 100 05/22/2021 0450   CL 104 05/07/2014 0516   CO2 25 05/22/2021 0450   CO2 29 05/07/2014 0516   GLUCOSE 84 05/22/2021 0450   GLUCOSE 64 (L) 05/07/2014 0516   BUN 32 (H) 05/22/2021 0450   BUN 35 (H) 11/22/2015 0830   BUN 26 (H) 05/07/2014 0516   CREATININE 4.07 (H) 05/22/2021 0450   CREATININE 1.13 05/07/2014 0516   CALCIUM 7.8 (L) 05/22/2021 0450   CALCIUM 7.9 (L) 05/07/2014 0516   GFRNONAA 10 (L) 05/22/2021 0450   GFRNONAA 47 (L) 05/07/2014 0516   GFRAA 6 (L) 06/30/2020 0611   GFRAA 54 (L) 05/07/2014 0516     Adele Barthel, MD, FACS, FSVS Covering for [facility]: [phone]  05/22/2021, 9:28 AM

## 2021-05-22 NOTE — Progress Notes (Addendum)
PROGRESS NOTE    [name]  TIW:[id number] DOB: 1934-06-27 DOA: 05/21/2021 PCP: Idelle Crouch, MD  Outpatient Specialists: nephrology, cardiology    Brief Narrative:   Admitted for hypoxic respiratory failure 2/2 volume overload 2/2 inadequate dialysis 2/2 clotted left brachial AVF.    Assessment & Plan:   Principal Problem:   Acute diastolic CHF (congestive heart failure) (HCC) Active Problems:   Diabetes mellitus (HCC)   CAD (coronary artery disease)   Hyperkalemia   History of CVA with residual deficit   Ambulatory dysfunction   ESRD (end stage renal disease) on dialysis (HCC)   Pulmonary edema   Macular degeneration   # Acute hypoxic respiratory failure on chronic hypoxic respiratory failure 2/2 volume overload. On 2 L at home. Respiratory status much improved after dialysis yesterday, currently breathing comfortably on 3 L - [place] O2  # Fluid overload 2/2 missed dialysis Hemodialysis yesterday. Appears plan is to resume mwf dialysis this week - torsemide 100 (home med) continuing on non-dialysis days  # ESRD Clotted left AFV. Temporary dialysis catheter placed in left groin yesterday. - npo after midnight for vascular procedure in the morning, will attempt thrombectomy, possible permacath placement  # HTN Here normotensive - home meds  # Venous stasis dermatitis Right lower extremity swelling greater than left with associated ulceration. Chronic problem, stable, followed outpt by vascular. Do not think this represents infection. Outpatient dopplers neg for DVT.  - will see if vascular has additional recs when they see tomorrow, otherwise outpt f/u  # Macrocytosis - f/u b12  # CAD S/p cabg x4. No chest pain - home aspirin, plavix, statin  # T2DM Here glucose wnl holding home glimepiride - daily fasting sugars  # Hypothyroid - home synthroid  # History CVA - home meds    DVT prophylaxis: heparin Code Status: dnr Family Communication:  husband updated @ bedside  Level of care: Progressive Cardiac Status is: Inpatient  Remains inpatient appropriate because:Inpatient level of care appropriate due to severity of illness  Dispo: The patient is from: Home              Anticipated d/c is to: Home, will order PT eval              Patient currently is not medically stable to d/c.   Difficult to place patient No        Consultants:  nephrology  Procedures: Temp dialysis catheter placed 8/20  Antimicrobials:  none    Subjective: This morning says breathing is at baseline, no chest pain or cough or fever.   Objective: Vitals:   05/21/21 2045 05/22/21 0354 05/22/21 0500 05/22/21 0756  BP: (!) 109/45 (!) 136/56  (!) 138/58  Pulse: 88 81  86  Resp: 20 18  18   Temp: 97.9 F (36.6 C) 97.7 F (36.5 C)  97.8 F (36.6 C)  TempSrc:  Oral    SpO2: 100% 100%  100%  Weight:   82.4 kg   Height:        Intake/Output Summary (Last 24 hours) at 05/22/2021 1047 Last data filed at 05/22/2021 0600 Gross per 24 hour  Intake --  Output 2100 ml  Net -2100 ml   Filed Weights   05/21/21 1500 05/22/21 0500  Weight: 83.9 kg 82.4 kg    Examination:  General exam: Appears calm and comfortable. Chronically ill appearing Respiratory system: Clear to auscultation save for rales @ bases Cardiovascular system: S1 & S2 heard, RRR. No JVD, murmurs,  rubs, gallops or clicks. No pedal edema. Gastrointestinal system: Abdomen is nondistended, soft and nontender. No organomegaly or masses felt. Normal bowel sounds heard. Central nervous system: Alert and oriented. No focal neurological deficits. Extremities: 2+ pitting edema Skin: echymoses Psychiatry: Judgement and insight appear normal. Mood & affect appropriate.     Data Reviewed: I have personally reviewed following labs and imaging studies  CBC: Recent Labs  Lab 05/21/21 0345 05/21/21 0700 05/22/21 0450  WBC 13.1* 15.2* 9.8  NEUTROABS 12.1*  --   --   HGB 10.1*  9.3* 9.1*  HCT 33.7* 30.7* 32.3*  MCV 103.1* 103.0* 108.8*  PLT 210 180 814*   Basic Metabolic Panel: Recent Labs  Lab 05/21/21 0345 05/22/21 0450  NA 135 134*  K 5.3* 4.5  CL 102 100  CO2 26 25  GLUCOSE 156* 84  BUN 51* 32*  CREATININE 5.99* 4.07*  CALCIUM 7.8* 7.8*   GFR: Estimated Creatinine Clearance: 10.7 mL/min (A) (by C-G formula based on SCr of 4.07 mg/dL (H)). Liver Function Tests: Recent Labs  Lab 05/21/21 0345  AST 14*  ALT 8  ALKPHOS 102  BILITOT 0.7  PROT 7.9  ALBUMIN 3.0*   No results for input(s): LIPASE, AMYLASE in the last 168 hours. No results for input(s): AMMONIA in the last 168 hours. Coagulation Profile: No results for input(s): INR, PROTIME in the last 168 hours. Cardiac Enzymes: No results for input(s): CKTOTAL, CKMB, CKMBINDEX, TROPONINI in the last 168 hours. BNP (last 3 results) No results for input(s): PROBNP in the last 8760 hours. HbA1C: No results for input(s): HGBA1C in the last 72 hours. CBG: Recent Labs  Lab 05/21/21 0723 05/21/21 1023 05/21/21 2052 05/22/21 0759  GLUCAP 98 99 97 84   Lipid Profile: No results for input(s): CHOL, HDL, LDLCALC, TRIG, CHOLHDL, LDLDIRECT in the last 72 hours. Thyroid Function Tests: No results for input(s): TSH, T4TOTAL, FREET4, T3FREE, THYROIDAB in the last 72 hours. Anemia Panel: No results for input(s): VITAMINB12, FOLATE, FERRITIN, TIBC, IRON, RETICCTPCT in the last 72 hours. Urine analysis:    Component Value Date/Time   COLORURINE RED (A) 06/23/2020 1025   APPEARANCEUR HAZY (A) 06/23/2020 1025   APPEARANCEUR Clear 05/03/2014 0503   LABSPEC 1.007 06/23/2020 1025   LABSPEC 1.015 05/03/2014 0503   PHURINE  06/23/2020 1025    TEST NOT REPORTED DUE TO COLOR INTERFERENCE OF URINE PIGMENT   GLUCOSEU (A) 06/23/2020 1025    TEST NOT REPORTED DUE TO COLOR INTERFERENCE OF URINE PIGMENT   GLUCOSEU Negative 05/03/2014 0503   HGBUR (A) 06/23/2020 1025    TEST NOT REPORTED DUE TO COLOR  INTERFERENCE OF URINE PIGMENT   BILIRUBINUR (A) 06/23/2020 1025    TEST NOT REPORTED DUE TO COLOR INTERFERENCE OF URINE PIGMENT   BILIRUBINUR Negative 05/03/2014 0503   KETONESUR (A) 06/23/2020 1025    TEST NOT REPORTED DUE TO COLOR INTERFERENCE OF URINE PIGMENT   PROTEINUR (A) 06/23/2020 1025    TEST NOT REPORTED DUE TO COLOR INTERFERENCE OF URINE PIGMENT   UROBILINOGEN 0.2 09/03/2012 1355   NITRITE (A) 06/23/2020 1025    TEST NOT REPORTED DUE TO COLOR INTERFERENCE OF URINE PIGMENT   LEUKOCYTESUR (A) 06/23/2020 1025    TEST NOT REPORTED DUE TO COLOR INTERFERENCE OF URINE PIGMENT   LEUKOCYTESUR Negative 05/03/2014 0503   Sepsis Labs: @LABRCNTIP (procalcitonin:4,lacticidven:4)  ) Recent Results (from the past 240 hour(s))  Resp Panel by RT-PCR (Flu A&B, Covid) Nasopharyngeal Swab     Status: None  Collection Time: 05/21/21  3:45 AM   Specimen: Nasopharyngeal Swab; Nasopharyngeal(NP) swabs in vial transport medium  Result Value Ref Range Status   SARS Coronavirus 2 by RT PCR NEGATIVE NEGATIVE Final    Comment: (NOTE) SARS-CoV-2 target nucleic acids are NOT DETECTED.  The SARS-CoV-2 RNA is generally detectable in upper respiratory specimens during the acute phase of infection. The lowest concentration of SARS-CoV-2 viral copies this assay can detect is 138 copies/mL. A negative result does not preclude SARS-Cov-2 infection and should not be used as the sole basis for treatment or other patient management decisions. A negative result may occur with  improper specimen collection/handling, submission of specimen other than nasopharyngeal swab, presence of viral mutation(s) within the areas targeted by this assay, and inadequate number of viral copies(<138 copies/mL). A negative result must be combined with clinical observations, patient history, and epidemiological information. The expected result is Negative.  Fact Sheet for Patients:   EntrepreneurPulse.com.au  Fact Sheet for Healthcare Providers:  IncredibleEmployment.be  This test is no t yet approved or cleared by the [place] FDA and  has been authorized for detection and/or diagnosis of SARS-CoV-2 by FDA under an Emergency Use Authorization (EUA). This EUA will remain  in effect (meaning this test can be used) for the duration of the COVID-19 declaration under Section 564(b)(1) of the Act, 21 U.S.C.section 360bbb-3(b)(1), unless the authorization is terminated  or revoked sooner.       Influenza A by PCR NEGATIVE NEGATIVE Final   Influenza B by PCR NEGATIVE NEGATIVE Final    Comment: (NOTE) The Xpert Xpress SARS-CoV-2/FLU/RSV plus assay is intended as an aid in the diagnosis of influenza from Nasopharyngeal swab specimens and should not be used as a sole basis for treatment. Nasal washings and aspirates are unacceptable for Xpert Xpress SARS-CoV-2/FLU/RSV testing.  Fact Sheet for Patients: EntrepreneurPulse.com.au  Fact Sheet for Healthcare Providers: IncredibleEmployment.be  This test is not yet approved or cleared by the [place] FDA and has been authorized for detection and/or diagnosis of SARS-CoV-2 by FDA under an Emergency Use Authorization (EUA). This EUA will remain in effect (meaning this test can be used) for the duration of the COVID-19 declaration under Section 564(b)(1) of the Act, 21 U.S.C. section 360bbb-3(b)(1), unless the authorization is terminated or revoked.  Performed at [facility], [address] [place], [place] [address]   Culture, blood (routine x 2)     Status: None (Preliminary result)   Collection Time: 05/21/21  3:45 AM   Specimen: BLOOD  Result Value Ref Range Status   Specimen Description BLOOD RIGHT ASSIST CONTROL  Final   Special Requests   Final    BOTTLES DRAWN AEROBIC AND ANAEROBIC Blood Culture adequate volume    Culture   Final    NO GROWTH 1 DAY Performed at [facility], [address] [place], [place] [address]    Report Status PENDING  Incomplete  Culture, blood (routine x 2)     Status: None (Preliminary result)   Collection Time: 05/21/21  4:05 AM   Specimen: BLOOD  Result Value Ref Range Status   Specimen Description BLOOD RIGHT HAND  Final   Special Requests   Final    BOTTLES DRAWN AEROBIC ONLY Blood Culture results may not be optimal due to an inadequate volume of blood received in culture bottles   Culture   Final    NO GROWTH 1 DAY Performed at [facility], [address], [place], [place] [address]  Report Status PENDING  Incomplete         Radiology Studies: DG Chest Port 1 View  Result Date: 05/21/2021 CLINICAL DATA:  Shortness of breath EXAM: PORTABLE CHEST 1 VIEW COMPARISON:  01/13/2021 FINDINGS: Moderate cardiomegaly. Mild pulmonary edema. Small pleural effusions. Remote median sternotomy. IMPRESSION: Cardiomegaly and mild pulmonary edema. Electronically Signed   By: Ulyses Jarred M.D.   On: 05/21/2021 03:45        Scheduled Meds:  [START ON 05/23/2021] aspirin  81 mg Oral Q M,W,F   calcium-vitamin D  1 tablet Oral Daily   Chlorhexidine Gluconate Cloth  6 each Topical Q0600   furosemide  20 mg Oral Daily   gabapentin  100 mg Oral BID   heparin  5,000 Units Subcutaneous Q8H   hydrALAZINE  25 mg Oral TID   insulin aspart  0-9 Units Subcutaneous TID WC   levothyroxine  150 mcg Oral Q0600   metoprolol tartrate  50 mg Oral BID   vitamin B-12  1,000 mcg Oral Daily   Continuous Infusions:   LOS: 1 day    Time spent: 40 min    Desma Maxim, MD [facility]   If 7PM-7AM, please contact night-coverage [email or web address] Password [facility] 05/22/2021, 10:47 AM

## 2021-05-22 NOTE — Progress Notes (Signed)
[facility]  ROUNDING NOTE   Subjective:   [name] is a 87 y.o. white female with diabetes, CAD with CABG, chronic respiratory failure 2L, diastolic CHF, hypertension and ESRD on HD. Patient presents to ED with complaints of shortness of breath. She has been admitted for Acute pulmonary edema (HCC) [J81.0] Pulmonary edema [J81.1] Respiratory distress [R06.03] Hypoxia [R09.02] ESRD (end stage renal disease) on dialysis ([place]) [N18.6, Z99.2] Generalized weakness [T61.4] Acute diastolic CHF (congestive heart failure) ([place]) [I50.31] Community acquired pneumonia, unspecified laterality [J18.9]  Hemodialysis treatment yesterday. Accessed patient's left upper extremity AVF with 15 ga needles and patient completed her entire dialysis treatment with no issues. BFR 400. UF of 2 liters.   Husband at bedside.  Left femoral temp HD catheter placed yesterday by Dr. Bridgett Larsson, Vascular Surgery, but not used for dialysis yesterday  Objective:  Vital signs in last 24 hours:  Temp:  [97.7 F (36.5 C)-98.9 F (37.2 C)] 98.9 F (37.2 C) (08/21 1122) Pulse Rate:  [81-92] 84 (08/21 1122) Resp:  [14-22] 18 (08/21 1122) BP: (109-155)/(45-96) 128/56 (08/21 1122) SpO2:  [91 %-100 %] 97 % (08/21 1122) Weight:  [82.4 kg-83.9 kg] 82.4 kg (08/21 0500)  Weight change:  Filed Weights   05/21/21 1500 05/22/21 0500  Weight: 83.9 kg 82.4 kg    Intake/Output: I/O last 3 completed shifts: In: -  Out: 2100 [Urine:100; Other:2000]   Intake/Output this shift:  No intake/output data recorded.  Physical Exam: General: NAD, laying in bed  Head: Normocephalic, atraumatic. Moist oral mucosal membranes  Eyes: Anicteric  Lungs:  Clear   Heart: Regular rate and rhythm  Abdomen:  Soft, nontender,   Extremities:  + peripheral edema. BLE +erythema, +induration, tender to palpation  Neurologic: Nonfocal, moving all four extremities  Skin: No lesions  Access: Lt AVF with thrill and bruit, Rt  Permcath, left femoral temp HD catheter 8/20 Dr. Bridgett Larsson    Basic Metabolic Panel: Recent Labs  Lab 05/21/21 0345 05/22/21 0450  NA 135 134*  K 5.3* 4.5  CL 102 100  CO2 26 25  GLUCOSE 156* 84  BUN 51* 32*  CREATININE 5.99* 4.07*  CALCIUM 7.8* 7.8*     Liver Function Tests: Recent Labs  Lab 05/21/21 0345  AST 14*  ALT 8  ALKPHOS 102  BILITOT 0.7  PROT 7.9  ALBUMIN 3.0*    No results for input(s): LIPASE, AMYLASE in the last 168 hours. No results for input(s): AMMONIA in the last 168 hours.  CBC: Recent Labs  Lab 05/21/21 0345 05/21/21 0700 05/22/21 0450  WBC 13.1* 15.2* 9.8  NEUTROABS 12.1*  --   --   HGB 10.1* 9.3* 9.1*  HCT 33.7* 30.7* 32.3*  MCV 103.1* 103.0* 108.8*  PLT 210 180 131*     Cardiac Enzymes: No results for input(s): CKTOTAL, CKMB, CKMBINDEX, TROPONINI in the last 168 hours.  BNP: Invalid input(s): POCBNP  CBG: Recent Labs  Lab 05/21/21 0723 05/21/21 1023 05/21/21 2052 05/22/21 0759 05/22/21 1124  GLUCAP 98 99 97 84 103*     Microbiology: Results for orders placed or performed during the hospital encounter of 05/21/21  Resp Panel by RT-PCR (Flu A&B, Covid) Nasopharyngeal Swab     Status: None   Collection Time: 05/21/21  3:45 AM   Specimen: Nasopharyngeal Swab; Nasopharyngeal(NP) swabs in vial transport medium  Result Value Ref Range Status   SARS Coronavirus 2 by RT PCR NEGATIVE NEGATIVE Final    Comment: (NOTE) SARS-CoV-2 target nucleic acids  are NOT DETECTED.  The SARS-CoV-2 RNA is generally detectable in upper respiratory specimens during the acute phase of infection. The lowest concentration of SARS-CoV-2 viral copies this assay can detect is 138 copies/mL. A negative result does not preclude SARS-Cov-2 infection and should not be used as the sole basis for treatment or other patient management decisions. A negative result may occur with  improper specimen collection/handling, submission of specimen other than  nasopharyngeal swab, presence of viral mutation(s) within the areas targeted by this assay, and inadequate number of viral copies(<138 copies/mL). A negative result must be combined with clinical observations, patient history, and epidemiological information. The expected result is Negative.  Fact Sheet for Patients:  EntrepreneurPulse.com.au  Fact Sheet for Healthcare Providers:  IncredibleEmployment.be  This test is no t yet approved or cleared by the [place] FDA and  has been authorized for detection and/or diagnosis of SARS-CoV-2 by FDA under an Emergency Use Authorization (EUA). This EUA will remain  in effect (meaning this test can be used) for the duration of the COVID-19 declaration under Section 564(b)(1) of the Act, 21 U.S.C.section 360bbb-3(b)(1), unless the authorization is terminated  or revoked sooner.       Influenza A by PCR NEGATIVE NEGATIVE Final   Influenza B by PCR NEGATIVE NEGATIVE Final    Comment: (NOTE) The Xpert Xpress SARS-CoV-2/FLU/RSV plus assay is intended as an aid in the diagnosis of influenza from Nasopharyngeal swab specimens and should not be used as a sole basis for treatment. Nasal washings and aspirates are unacceptable for Xpert Xpress SARS-CoV-2/FLU/RSV testing.  Fact Sheet for Patients: EntrepreneurPulse.com.au  Fact Sheet for Healthcare Providers: IncredibleEmployment.be  This test is not yet approved or cleared by the [place] FDA and has been authorized for detection and/or diagnosis of SARS-CoV-2 by FDA under an Emergency Use Authorization (EUA). This EUA will remain in effect (meaning this test can be used) for the duration of the COVID-19 declaration under Section 564(b)(1) of the Act, 21 U.S.C. section 360bbb-3(b)(1), unless the authorization is terminated or revoked.  Performed at [facility], [address] [place], [place]  [address]   Culture, blood (routine x 2)     Status: None (Preliminary result)   Collection Time: 05/21/21  3:45 AM   Specimen: BLOOD  Result Value Ref Range Status   Specimen Description BLOOD RIGHT ASSIST CONTROL  Final   Special Requests   Final    BOTTLES DRAWN AEROBIC AND ANAEROBIC Blood Culture adequate volume   Culture   Final    NO GROWTH 1 DAY Performed at [facility], [address], [place], [place] [address]    Report Status PENDING  Incomplete  Culture, blood (routine x 2)     Status: None (Preliminary result)   Collection Time: 05/21/21  4:05 AM   Specimen: BLOOD  Result Value Ref Range Status   Specimen Description BLOOD RIGHT HAND  Final   Special Requests   Final    BOTTLES DRAWN AEROBIC ONLY Blood Culture results may not be optimal due to an inadequate volume of blood received in culture bottles   Culture   Final    NO GROWTH 1 DAY Performed at [facility], [address], [place], [place] [address]    Report Status PENDING  Incomplete    Coagulation Studies: No results for input(s): LABPROT, INR in the last 72 hours.  Urinalysis: No results for input(s): COLORURINE, LABSPEC, PHURINE, GLUCOSEU, HGBUR, BILIRUBINUR, KETONESUR, PROTEINUR, UROBILINOGEN, NITRITE, LEUKOCYTESUR in the  last 72 hours.  Invalid input(s): APPERANCEUR    Imaging: DG Chest Port 1 View  Result Date: 05/21/2021 CLINICAL DATA:  Shortness of breath EXAM: PORTABLE CHEST 1 VIEW COMPARISON:  01/13/2021 FINDINGS: Moderate cardiomegaly. Mild pulmonary edema. Small pleural effusions. Remote median sternotomy. IMPRESSION: Cardiomegaly and mild pulmonary edema. Electronically Signed   By: Ulyses Jarred M.D.   On: 05/21/2021 03:45     Medications:      [START ON 05/23/2021] aspirin  81 mg Oral Q M,W,F   calcium-vitamin D  1 tablet Oral Daily   Chlorhexidine Gluconate Cloth  6 each Topical Q0600   clopidogrel  75 mg Oral Daily   gabapentin  100 mg Oral BID   heparin  5,000  Units Subcutaneous Q8H   hydrALAZINE  25 mg Oral TID   insulin aspart  0-9 Units Subcutaneous TID WC   levothyroxine  150 mcg Oral Q0600   metoprolol tartrate  50 mg Oral BID   torsemide  100 mg Oral Once   vitamin B-12  1,000 mcg Oral Daily     Assessment/ Plan:  [name] is a 87 y.o.  female with diabetes, CAD with CABG, chronic respiratory failure 2L, diastolic CHF, hypertension and ESRD on HD. Patient presents to ED with complaints of shortness of breath. She has been admitted for Acute pulmonary edema (HCC) [J81.0] Pulmonary edema [J81.1] Respiratory distress [R06.03] Hypoxia [R09.02] ESRD (end stage renal disease) on dialysis ([place]) [N18.6, Z99.2] Generalized weakness [W41.3] Acute diastolic CHF (congestive heart failure) ([place]) [I50.31] Community acquired pneumonia, unspecified laterality [J18.9]  [place]. MWF LUE AVF 83kg   End stage renal disease on dialysis: hemodialysis treatment yesterday. Tolerated treatment well. Volume status has improved. Next dialysis treatment for tomorrow, continue MWF schedule.  - Complication of dialysis access. No issues with treatment yesterday but with trouble as an outpatient. Appreciate vascular input. Plan on fistulogram tomorrow.  - Patient remains volume overloaded despite being under her outpatient dry weight. Will continue to challenge with ultrafiltration.   2. Anemia of chronic kidney disease: macrocytic Lab Results  Component Value Date   HGB 9.1 (L) 05/22/2021    - EPO with MWF HD treatments  3. Secondary Hyperparathyroidism: outpatient labs from 05/09/2021: Phos 5.8, calcium 8.1, PTH 418.  - restart calcium carbonate with meals.   4. Hypertension : continue home regimen of torsemide, hydralazine and metoprolol.   5. Right lower extremity induration and ulcer: examination is concerning for cellulitis. Will discuss with primary team and vascular.    LOS: 1 Krystalyn Kubota 8/21/202212:10 PM

## 2021-05-22 NOTE — Progress Notes (Signed)
PT Cancellation Note  Patient Details Name: [name] MRN: [id number] DOB: 08-30-1934   Cancelled Treatment:    Reason Eval/Treat Not Completed: Other (comment)  PT orders received, chart reviewed. Pt noted to have temporary femoral dialysis catheter & unable to mobilize pt for PT evaluation. Will hold PT evaluation at this time.  Per chart, pt to possibly have permacath placed tomorrow. Will f/u as able.  Lavone Nian, PT, DPT 05/22/21, 1:29 PM   [name] 05/22/2021, 1:26 PM

## 2021-05-23 ENCOUNTER — Other Ambulatory Visit (INDEPENDENT_AMBULATORY_CARE_PROVIDER_SITE_OTHER): Payer: Self-pay | Admitting: Vascular Surgery

## 2021-05-23 ENCOUNTER — Encounter
Admission: EM | Disposition: A | Discharge status: Home Health | Payer: Self-pay | Source: Home / Self Care | Attending: Obstetrics and Gynecology

## 2021-05-23 DIAGNOSIS — Z992 Dependence on renal dialysis: Secondary | ICD-10-CM | POA: Diagnosis not present

## 2021-05-23 DIAGNOSIS — T82590A Other mechanical complication of surgically created arteriovenous fistula, initial encounter: Secondary | ICD-10-CM

## 2021-05-23 DIAGNOSIS — R7881 Bacteremia: Secondary | ICD-10-CM | POA: Diagnosis not present

## 2021-05-23 DIAGNOSIS — I5031 Acute diastolic (congestive) heart failure: Secondary | ICD-10-CM | POA: Diagnosis not present

## 2021-05-23 DIAGNOSIS — N186 End stage renal disease: Secondary | ICD-10-CM

## 2021-05-23 HISTORY — PX: DIALYSIS/PERMA CATHETER INSERTION: CATH118288

## 2021-05-23 HISTORY — PX: A/V SHUNT INTERVENTION: CATH118220

## 2021-05-23 LAB — BASIC METABOLIC PANEL
Anion gap: 7 (ref 5–15)
BUN: 45 mg/dL — ABNORMAL HIGH (ref 8–23)
CO2: 28 mmol/L (ref 22–32)
Calcium: 7.8 mg/dL — ABNORMAL LOW (ref 8.9–10.3)
Chloride: 99 mmol/L (ref 98–111)
Creatinine, Ser: 5.08 mg/dL — ABNORMAL HIGH (ref 0.44–1.00)
GFR, Estimated: 8 mL/min — ABNORMAL LOW (ref >60)
Glucose, Bld: 83 mg/dL (ref 70–99)
Potassium: 4.6 mmol/L (ref 3.5–5.1)
Sodium: 134 mmol/L — ABNORMAL LOW (ref 135–145)

## 2021-05-23 LAB — PHOSPHORUS: Phosphorus: 4.5 mg/dL (ref 2.5–4.6)

## 2021-05-23 LAB — CBC
HCT: 30.5 % — ABNORMAL LOW (ref 36.0–46.0)
Hemoglobin: 9.2 g/dL — ABNORMAL LOW (ref 12.0–15.0)
MCH: 30.2 pg (ref 26.0–34.0)
MCHC: 30.2 g/dL (ref 30.0–36.0)
MCV: 100 fL (ref 80.0–100.0)
Platelets: 166 10*3/uL (ref 150–400)
RBC: 3.05 MIL/uL — ABNORMAL LOW (ref 3.87–5.11)
RDW: 15.4 % (ref 11.5–15.5)
WBC: 7.4 10*3/uL (ref 4.0–10.5)
nRBC: 0 % (ref 0.0–0.2)

## 2021-05-23 LAB — GLUCOSE, CAPILLARY
Glucose-Capillary: 74 mg/dL (ref 70–99)
Glucose-Capillary: 71 mg/dL (ref 70–99)
Glucose-Capillary: 180 mg/dL — ABNORMAL HIGH (ref 70–99)
Glucose-Capillary: 182 mg/dL — ABNORMAL HIGH (ref 70–99)
Glucose-Capillary: 182 mg/dL — ABNORMAL HIGH (ref 70–99)

## 2021-05-23 LAB — VITAMIN B12: Vitamin B-12: 1056 pg/mL — ABNORMAL HIGH (ref 180–914)

## 2021-05-23 LAB — HEMOGLOBIN A1C
Hgb A1c MFr Bld: 4.4 % — ABNORMAL LOW (ref 4.8–5.6)
Mean Plasma Glucose: 79.58 mg/dL

## 2021-05-23 SURGERY — DIALYSIS/PERMA CATHETER INSERTION
Anesthesia: Choice

## 2021-05-23 MED ORDER — CEFAZOLIN SODIUM-DEXTROSE 1-4 GM/50ML-% IV SOLN
1.0000 g | Freq: Once | INTRAVENOUS | Status: AC
  Administered 2021-05-23: 1 g via INTRAVENOUS

## 2021-05-23 MED ORDER — FAMOTIDINE 20 MG PO TABS: 40.0000 mg | ORAL_TABLET | Freq: Once | ORAL | Status: DC | PRN

## 2021-05-23 MED ORDER — EPOETIN ALFA 10000 UNIT/ML IJ SOLN: 10000.0000 [IU] | INTRAMUSCULAR | Status: DC

## 2021-05-23 MED ORDER — IODIXANOL 320 MG/ML IV SOLN
INTRAVENOUS | Status: DC | PRN
  Administered 2021-05-23: 20 mL via INTRAVENOUS

## 2021-05-23 MED ORDER — ONDANSETRON HCL 4 MG/2ML IJ SOLN: 4.0000 mg | Freq: Four times a day (QID) | INTRAMUSCULAR | Status: DC | PRN

## 2021-05-23 MED ORDER — MIDAZOLAM HCL 2 MG/ML PO SYRP: 8.0000 mg | ORAL_SOLUTION | Freq: Once | ORAL | Status: DC | PRN

## 2021-05-23 MED ORDER — METHYLPREDNISOLONE SODIUM SUCC 125 MG IJ SOLR
INTRAMUSCULAR | Status: AC
  Administered 2021-05-23: 125 mg via INTRAVENOUS
  Filled 2021-05-23: qty 2

## 2021-05-23 MED ORDER — HEPARIN SODIUM (PORCINE) 1000 UNIT/ML IJ SOLN
INTRAMUSCULAR | Status: AC
  Filled 2021-05-23: qty 1

## 2021-05-23 MED ORDER — FENTANYL CITRATE (PF) 100 MCG/2ML IJ SOLN
INTRAMUSCULAR | Status: DC | PRN
  Administered 2021-05-23: 50 ug via INTRAVENOUS

## 2021-05-23 MED ORDER — SODIUM CHLORIDE 0.9 % IV SOLN
INTRAVENOUS | Status: DC
  Administered 2021-05-23: 1000 mL via INTRAVENOUS

## 2021-05-23 MED ORDER — DIPHENHYDRAMINE HCL 50 MG/ML IJ SOLN
INTRAMUSCULAR | Status: AC
  Administered 2021-05-23: 25 mg via INTRAVENOUS
  Filled 2021-05-23: qty 1

## 2021-05-23 MED ORDER — HYDROMORPHONE HCL 1 MG/ML IJ SOLN
1.0000 mg | Freq: Once | INTRAMUSCULAR | Status: DC | PRN
Start: 2021-05-23 — End: 2021-05-25

## 2021-05-23 MED ORDER — METHYLPREDNISOLONE SODIUM SUCC 125 MG IJ SOLR: 125.0000 mg | Freq: Once | INTRAMUSCULAR | Status: AC | PRN

## 2021-05-23 MED ORDER — MIDAZOLAM HCL 2 MG/2ML IJ SOLN
INTRAMUSCULAR | Status: AC
  Filled 2021-05-23: qty 2

## 2021-05-23 MED ORDER — FENTANYL CITRATE (PF) 100 MCG/2ML IJ SOLN
INTRAMUSCULAR | Status: AC
  Filled 2021-05-23: qty 2

## 2021-05-23 MED ORDER — FAMOTIDINE 20 MG PO TABS
ORAL_TABLET | ORAL | Status: AC
  Filled 2021-05-23: qty 2

## 2021-05-23 MED ORDER — DIPHENHYDRAMINE HCL 50 MG/ML IJ SOLN: 50.0000 mg | Freq: Once | INTRAMUSCULAR | Status: AC | PRN

## 2021-05-23 MED ORDER — MIDAZOLAM HCL 2 MG/2ML IJ SOLN
INTRAMUSCULAR | Status: DC | PRN
  Administered 2021-05-23: 1 mg via INTRAVENOUS

## 2021-05-23 SURGICAL SUPPLY — 14 items
BALLN LUTONIX DCB 4X60X130 (BALLOONS) ×3
BALLOON LUTONIX DCB 4X60X130 (BALLOONS) IMPLANT
CATH BEACON 5 .035 40 KMP TP (CATHETERS) IMPLANT
CATH BEACON 5 .038 40 KMP TP (CATHETERS) ×1
COVER PROBE U/S 5X48 (MISCELLANEOUS) ×1 IMPLANT
DRAPE BRACHIAL (DRAPES) ×1 IMPLANT
KIT ENCORE 26 ADVANTAGE (KITS) ×1 IMPLANT
KIT MICROPUNCTURE NIT STIFF (SHEATH) ×1 IMPLANT
NDL ENTRY 21GA 7CM ECHOTIP (NEEDLE) IMPLANT
NEEDLE ENTRY 21GA 7CM ECHOTIP (NEEDLE) ×3 IMPLANT
PACK ANGIOGRAPHY (CUSTOM PROCEDURE TRAY) ×1 IMPLANT
SHEATH BRITE TIP 6FRX5.5 (SHEATH) ×1 IMPLANT
SUT MNCRL AB 4-0 PS2 18 (SUTURE) ×1 IMPLANT
WIRE MAGIC TOR.035 180C (WIRE) ×1 IMPLANT

## 2021-05-23 NOTE — Progress Notes (Signed)
PT Cancellation Note  Patient Details Name: [name] MRN: [id number] DOB: Jun 17, 1934   Cancelled Treatment:    Reason Eval/Treat Not Completed: Other (comment) PT orders received, chart reviewed. Pt noted to have temporary femoral dialysis catheter but plans for AVF thrombectomy & possible permacath placement today. Will hold PT evaluation at this time (limited mobility assessment 2/2 current temporary dialysis catheter) & will re-assess as able.  Lavone Nian, PT, DPT 05/23/21, 9:29 AM    [name] 05/23/2021, 9:28 AM

## 2021-05-23 NOTE — Progress Notes (Addendum)
PROGRESS NOTE    [name]  GQQ:[id number] DOB: 06-02-1934 DOA: 05/21/2021 PCP: Idelle Crouch, MD  Outpatient Specialists: nephrology, cardiology    Brief Narrative:   Admitted for hypoxic respiratory failure 2/2 volume overload 2/2 inadequate dialysis 2/2 clotted left brachial AVF.    Assessment & Plan:   Principal Problem:   Acute diastolic CHF (congestive heart failure) (HCC) Active Problems:   Diabetes mellitus (HCC)   CAD (coronary artery disease)   Hyperkalemia   History of CVA with residual deficit   Ambulatory dysfunction   ESRD (end stage renal disease) on dialysis (HCC)   Pulmonary edema   Macular degeneration   # Acute hypoxic respiratory failure on chronic hypoxic respiratory failure 2/2 volume overload. On 2 L at home. Respiratory status much improved after dialysis yesterday, currently breathing comfortably on 3 L - [place] O2  # Fluid overload 2/2 missed dialysis Improving. Now back to [facility] dialysis schedule - dialysis today - torsemide 100 (home med) continuing on non-dialysis days  # ESRD Clotted left AFV. Temporary dialysis catheter placed in left groin. Percutaneous angioplasty with vascular surgery on 8/22. - plan for dialysis today, if successful potential d/c after  # Bacteremia Unclear if one or 2 blood cultures growing sphingobacterium multivorum, a GNR. Have reached out to ID about its clinical significance.  # HTN Here normotensive - home meds  # Venous stasis dermatitis Right lower extremity swelling greater than left with associated ulceration. Chronic problem, stable, followed outpt by vascular. Do not think this represents infection. Outpatient dopplers neg for DVT.  - outpt f/u  # Macrocytosis B12 wnl  # CAD S/p cabg x4. No chest pain - home aspirin, plavix, statin  # T2DM Here glucose wnl holding home glimepiride - daily fasting sugars  # Hypothyroid - home synthroid  # History CVA - home meds    DVT  prophylaxis: heparin Code Status: dnr Family Communication: husband updated @ bedside 8/21  Level of care: Progressive Cardiac Status is: Inpatient  Remains inpatient appropriate because:Inpatient level of care appropriate due to severity of illness  Dispo: The patient is from: Home              Anticipated d/c is to: Home, will order PT eval              Patient currently is not medically stable to d/c.   Difficult to place patient No        Consultants:  nephrology  Procedures: Temp dialysis catheter placed 8/20  Antimicrobials:  none    Subjective: This morning says breathing is at baseline, no chest pain or cough or fever. Tolerated today's procedure  Objective: Vitals:   05/23/21 1305 05/23/21 1315 05/23/21 1330 05/23/21 1345  BP: (!) 127/54 140/63 115/65 (!) 129/56  Pulse:  74 81 80  Resp: 15 (!) 25 11 15   Temp:      TempSrc:      SpO2: 95% 96% 97% 97%  Weight:    83.5 kg  Height:        Intake/Output Summary (Last 24 hours) at 05/23/2021 1429 Last data filed at 05/23/2021 0400 Gross per 24 hour  Intake 220 ml  Output 100 ml  Net 120 ml   Filed Weights   05/21/21 1500 05/22/21 0500 05/23/21 1345  Weight: 83.9 kg 82.4 kg 83.5 kg    Examination:  General exam: Appears calm and comfortable. Chronically ill appearing Respiratory system: Clear to auscultation save for rales @ bases Cardiovascular  system: S1 & S2 heard, RRR. No JVD, murmurs, rubs, gallops or clicks. No pedal edema. Gastrointestinal system: Abdomen is nondistended, soft and nontender. No organomegaly or masses felt. Normal bowel sounds heard. Central nervous system: Alert and oriented. No focal neurological deficits. Extremities: 2+ pitting edema Skin: echymoses Psychiatry: Judgement and insight appear normal. Mood & affect appropriate.     Data Reviewed: I have personally reviewed following labs and imaging studies  CBC: Recent Labs  Lab 05/21/21 0345 05/21/21 0700  05/22/21 0450 05/23/21 0636  WBC 13.1* 15.2* 9.8 7.4  NEUTROABS 12.1*  --   --   --   HGB 10.1* 9.3* 9.1* 9.2*  HCT 33.7* 30.7* 32.3* 30.5*  MCV 103.1* 103.0* 108.8* 100.0  PLT 210 180 131* 161   Basic Metabolic Panel: Recent Labs  Lab 05/21/21 0345 05/22/21 0450 05/23/21 0522  NA 135 134* 134*  K 5.3* 4.5 4.6  CL 102 100 99  CO2 26 25 28   GLUCOSE 156* 84 83  BUN 51* 32* 45*  CREATININE 5.99* 4.07* 5.08*  CALCIUM 7.8* 7.8* 7.8*  PHOS  --   --  4.5   GFR: Estimated Creatinine Clearance: 8.6 mL/min (A) (by C-G formula based on SCr of 5.08 mg/dL (H)). Liver Function Tests: Recent Labs  Lab 05/21/21 0345  AST 14*  ALT 8  ALKPHOS 102  BILITOT 0.7  PROT 7.9  ALBUMIN 3.0*   No results for input(s): LIPASE, AMYLASE in the last 168 hours. No results for input(s): AMMONIA in the last 168 hours. Coagulation Profile: No results for input(s): INR, PROTIME in the last 168 hours. Cardiac Enzymes: No results for input(s): CKTOTAL, CKMB, CKMBINDEX, TROPONINI in the last 168 hours. BNP (last 3 results) No results for input(s): PROBNP in the last 8760 hours. HbA1C: Recent Labs    05/21/21 0700  HGBA1C 4.4*   CBG: Recent Labs  Lab 05/22/21 1124 05/22/21 1629 05/22/21 2022 05/23/21 0830 05/23/21 1203  GLUCAP 103* 78 94 74 71   Lipid Profile: No results for input(s): CHOL, HDL, LDLCALC, TRIG, CHOLHDL, LDLDIRECT in the last 72 hours. Thyroid Function Tests: No results for input(s): TSH, T4TOTAL, FREET4, T3FREE, THYROIDAB in the last 72 hours. Anemia Panel: Recent Labs    05/22/21 1605  VITAMINB12 1,056*   Urine analysis:    Component Value Date/Time   COLORURINE RED (A) 06/23/2020 1025   APPEARANCEUR HAZY (A) 06/23/2020 1025   APPEARANCEUR Clear 05/03/2014 0503   LABSPEC 1.007 06/23/2020 1025   LABSPEC 1.015 05/03/2014 0503   PHURINE  06/23/2020 1025    TEST NOT REPORTED DUE TO COLOR INTERFERENCE OF URINE PIGMENT   GLUCOSEU (A) 06/23/2020 1025    TEST NOT  REPORTED DUE TO COLOR INTERFERENCE OF URINE PIGMENT   GLUCOSEU Negative 05/03/2014 0503   HGBUR (A) 06/23/2020 1025    TEST NOT REPORTED DUE TO COLOR INTERFERENCE OF URINE PIGMENT   BILIRUBINUR (A) 06/23/2020 1025    TEST NOT REPORTED DUE TO COLOR INTERFERENCE OF URINE PIGMENT   BILIRUBINUR Negative 05/03/2014 0503   KETONESUR (A) 06/23/2020 1025    TEST NOT REPORTED DUE TO COLOR INTERFERENCE OF URINE PIGMENT   PROTEINUR (A) 06/23/2020 1025    TEST NOT REPORTED DUE TO COLOR INTERFERENCE OF URINE PIGMENT   UROBILINOGEN 0.2 09/03/2012 1355   NITRITE (A) 06/23/2020 1025    TEST NOT REPORTED DUE TO COLOR INTERFERENCE OF URINE PIGMENT   LEUKOCYTESUR (A) 06/23/2020 1025    TEST NOT REPORTED DUE TO COLOR INTERFERENCE OF  URINE PIGMENT   LEUKOCYTESUR Negative 05/03/2014 0503   Sepsis Labs: @LABRCNTIP (procalcitonin:4,lacticidven:4)  ) Recent Results (from the past 240 hour(s))  Resp Panel by RT-PCR (Flu A&B, Covid) Nasopharyngeal Swab     Status: None   Collection Time: 05/21/21  3:45 AM   Specimen: Nasopharyngeal Swab; Nasopharyngeal(NP) swabs in vial transport medium  Result Value Ref Range Status   SARS Coronavirus 2 by RT PCR NEGATIVE NEGATIVE Final    Comment: (NOTE) SARS-CoV-2 target nucleic acids are NOT DETECTED.  The SARS-CoV-2 RNA is generally detectable in upper respiratory specimens during the acute phase of infection. The lowest concentration of SARS-CoV-2 viral copies this assay can detect is 138 copies/mL. A negative result does not preclude SARS-Cov-2 infection and should not be used as the sole basis for treatment or other patient management decisions. A negative result may occur with  improper specimen collection/handling, submission of specimen other than nasopharyngeal swab, presence of viral mutation(s) within the areas targeted by this assay, and inadequate number of viral copies(<138 copies/mL). A negative result must be combined with clinical observations,  patient history, and epidemiological information. The expected result is Negative.  Fact Sheet for Patients:  EntrepreneurPulse.com.au  Fact Sheet for Healthcare Providers:  IncredibleEmployment.be  This test is no t yet approved or cleared by the [place] FDA and  has been authorized for detection and/or diagnosis of SARS-CoV-2 by FDA under an Emergency Use Authorization (EUA). This EUA will remain  in effect (meaning this test can be used) for the duration of the COVID-19 declaration under Section 564(b)(1) of the Act, 21 U.S.C.section 360bbb-3(b)(1), unless the authorization is terminated  or revoked sooner.       Influenza A by PCR NEGATIVE NEGATIVE Final   Influenza B by PCR NEGATIVE NEGATIVE Final    Comment: (NOTE) The Xpert Xpress SARS-CoV-2/FLU/RSV plus assay is intended as an aid in the diagnosis of influenza from Nasopharyngeal swab specimens and should not be used as a sole basis for treatment. Nasal washings and aspirates are unacceptable for Xpert Xpress SARS-CoV-2/FLU/RSV testing.  Fact Sheet for Patients: EntrepreneurPulse.com.au  Fact Sheet for Healthcare Providers: IncredibleEmployment.be  This test is not yet approved or cleared by the [place] FDA and has been authorized for detection and/or diagnosis of SARS-CoV-2 by FDA under an Emergency Use Authorization (EUA). This EUA will remain in effect (meaning this test can be used) for the duration of the COVID-19 declaration under Section 564(b)(1) of the Act, 21 U.S.C. section 360bbb-3(b)(1), unless the authorization is terminated or revoked.  Performed at [facility], [address] [place], [place] [address]   Culture, blood (routine x 2)     Status: Abnormal   Collection Time: 05/21/21  3:45 AM   Specimen: BLOOD  Result Value Ref Range Status   Specimen Description   Final    BLOOD RIGHT ASSIST  CONTROL Performed at [facility], [address] [place], [place] [address]    Special Requests   Final    BOTTLES DRAWN AEROBIC AND ANAEROBIC Blood Culture adequate volume Performed at [facility], [address] [place], [place] [address]    Culture  Setup Time   Final    Organism ID to follow GRAM NEGATIVE RODS AEROBIC BOTTLE ONLY IN BOTH AEROBIC AND ANAEROBIC BOTTLES CRITICAL RESULT CALLED TO, READ BACK BY AND VERIFIED WITH: SCHEMA HALLAJI AT [id number] ON 05/22/21 BY SS Performed at [facility], [address], [place], [place] [address]    Culture (A)  Final    [place] Standardized susceptibility testing for this organism is not available. Performed at [facility], [place] [address], [place], [place] [address]    Report Status 05/23/2021 FINAL  Final  Blood Culture ID Panel (Reflexed)     Status: None   Collection Time: 05/21/21  3:45 AM  Result Value Ref Range Status   Enterococcus faecalis NOT DETECTED NOT DETECTED Final   Enterococcus Faecium NOT DETECTED NOT DETECTED Final   Listeria monocytogenes NOT DETECTED NOT DETECTED Final   Staphylococcus species NOT DETECTED NOT DETECTED Final   Staphylococcus aureus (BCID) NOT DETECTED NOT DETECTED Final   Staphylococcus epidermidis NOT DETECTED NOT DETECTED Final   Staphylococcus lugdunensis NOT DETECTED NOT DETECTED Final   Streptococcus species NOT DETECTED NOT DETECTED Final   Streptococcus agalactiae NOT DETECTED NOT DETECTED Final   Streptococcus pneumoniae NOT DETECTED NOT DETECTED Final   Streptococcus pyogenes NOT DETECTED NOT DETECTED Final   A.calcoaceticus-baumannii NOT DETECTED NOT DETECTED Final   Bacteroides fragilis NOT DETECTED NOT DETECTED Final   Enterobacterales NOT DETECTED NOT DETECTED Final   Enterobacter cloacae complex NOT DETECTED NOT DETECTED Final   Escherichia coli NOT DETECTED NOT DETECTED Final   Klebsiella aerogenes NOT DETECTED NOT DETECTED  Final   Klebsiella oxytoca NOT DETECTED NOT DETECTED Final   Klebsiella pneumoniae NOT DETECTED NOT DETECTED Final   Proteus species NOT DETECTED NOT DETECTED Final   Salmonella species NOT DETECTED NOT DETECTED Final   Serratia marcescens NOT DETECTED NOT DETECTED Final   Haemophilus influenzae NOT DETECTED NOT DETECTED Final   Neisseria meningitidis NOT DETECTED NOT DETECTED Final   Pseudomonas aeruginosa NOT DETECTED NOT DETECTED Final   Stenotrophomonas maltophilia NOT DETECTED NOT DETECTED Final   Candida albicans NOT DETECTED NOT DETECTED Final   Candida auris NOT DETECTED NOT DETECTED Final   Candida glabrata NOT DETECTED NOT DETECTED Final   Candida krusei NOT DETECTED NOT DETECTED Final   Candida parapsilosis NOT DETECTED NOT DETECTED Final   Candida tropicalis NOT DETECTED NOT DETECTED Final   Cryptococcus neoformans/gattii NOT DETECTED NOT DETECTED Final    Comment: Performed at [facility], [address] [place], [place] [address]  Culture, blood (routine x 2)     Status: None (Preliminary result)   Collection Time: 05/21/21  4:05 AM   Specimen: BLOOD  Result Value Ref Range Status   Specimen Description   Final    BLOOD RIGHT HAND Performed at [facility], [address] [place], [place] [address]    Special Requests   Final    BOTTLES DRAWN AEROBIC ONLY Blood Culture results may not be optimal due to an inadequate volume of blood received in culture bottles Performed at [facility], [address] [place], [place] [address]    Culture  Setup Time AEROBIC BOTTLE ONLY GRAM NEGATIVE RODS   Final   Culture GRAM NEGATIVE RODS  Final   Report Status PENDING  Incomplete         Radiology Studies: PERIPHERAL VASCULAR CATHETERIZATION  Result Date: 05/23/2021 See surgical note for result.       Scheduled Meds:  aspirin  81 mg Oral Q M,W,F   calcium carbonate  2 tablet Oral TID WC   calcium-vitamin D  1 tablet Oral Daily    Chlorhexidine Gluconate Cloth  6 each Topical Q0600   clopidogrel  75 mg Oral Daily   [START ON 05/25/2021] epoetin (EPOGEN/PROCRIT) injection  10,000 Units Intravenous Q M,W,F-HD  famotidine       fentaNYL       gabapentin  100 mg Oral BID   heparin  5,000 Units Subcutaneous Q8H   hydrALAZINE  25 mg Oral TID   insulin aspart  0-9 Units Subcutaneous TID WC   levothyroxine  150 mcg Oral Q0600   metoprolol tartrate  50 mg Oral BID   midazolam       neomycin-bacitracin-polymyxin   Topical BID   vitamin B-12  1,000 mcg Oral Daily   Continuous Infusions:  ceFEPime (MAXIPIME) IV Stopped (05/22/21 1913)     LOS: 2 days    Time spent: 30 min    Desma Maxim, MD [facility]   If 7PM-7AM, please contact night-coverage [email or web address] Password [facility] 05/23/2021, 2:29 PM

## 2021-05-23 NOTE — Progress Notes (Signed)
[facility]  ROUNDING NOTE   Subjective:   [name] is a 87 y.o. white female with diabetes, CAD with CABG, chronic respiratory failure 2L, diastolic CHF, hypertension and ESRD on HD. Patient presents to ED with complaints of shortness of breath. She has been admitted for Acute pulmonary edema (HCC) [J81.0] Pulmonary edema [J81.1] Respiratory distress [R06.03] Hypoxia [R09.02] ESRD (end stage renal disease) on dialysis ([place]) [N18.6, Z99.2] Generalized weakness [Z60.1] Acute diastolic CHF (congestive heart failure) ([place]) [I50.31] Community acquired pneumonia, unspecified laterality [J18.9]  Left femoral temp HD catheter placed 05/21/21 by Dr. Bridgett Larsson, Vascular Surgery, but not used for dialysis yesterday  Patient seen at bedside Husband at bedside Currently NPO for angiogram  Scheduled for dialysis today after procedure  Objective:  Vital signs in last 24 hours:  Temp:  [97.6 F (36.4 C)-97.9 F (36.6 C)] 97.6 F (36.4 C) (08/22 1157) Pulse Rate:  [72-86] 80 (08/22 1345) Resp:  [10-25] 15 (08/22 1345) BP: (96-140)/(48-81) 129/56 (08/22 1345) SpO2:  [94 %-100 %] 97 % (08/22 1345)  Weight change:  Filed Weights   05/21/21 1500 05/22/21 0500  Weight: 83.9 kg 82.4 kg    Intake/Output: I/O last 3 completed shifts: In: 220 [P.O.:120; IV Piggyback:100] Out: 200 [Urine:200]   Intake/Output this shift:  No intake/output data recorded.  Physical Exam: General: NAD, laying in bed  Head: Normocephalic, atraumatic. Moist oral mucosal membranes  Eyes: Anicteric  Lungs:  Clear, normal effort  Heart: Regular rate and rhythm  Abdomen:  Soft, nontender  Extremities:  + peripheral edema. BLE +erythema, +induration, tender to palpation  Neurologic: Nonfocal, moving all four extremities  Skin: No lesions  Access: Lt AVF with thrill and bruit, Rt Permcath, left femoral temp HD catheter 8/20 Dr. Bridgett Larsson    Basic Metabolic Panel: Recent Labs  Lab 05/21/21 0345  05/22/21 0450 05/23/21 0522  NA 135 134* 134*  K 5.3* 4.5 4.6  CL 102 100 99  CO2 26 25 28   GLUCOSE 156* 84 83  BUN 51* 32* 45*  CREATININE 5.99* 4.07* 5.08*  CALCIUM 7.8* 7.8* 7.8*     Liver Function Tests: Recent Labs  Lab 05/21/21 0345  AST 14*  ALT 8  ALKPHOS 102  BILITOT 0.7  PROT 7.9  ALBUMIN 3.0*    No results for input(s): LIPASE, AMYLASE in the last 168 hours. No results for input(s): AMMONIA in the last 168 hours.  CBC: Recent Labs  Lab 05/21/21 0345 05/21/21 0700 05/22/21 0450 05/23/21 0636  WBC 13.1* 15.2* 9.8 7.4  NEUTROABS 12.1*  --   --   --   HGB 10.1* 9.3* 9.1* 9.2*  HCT 33.7* 30.7* 32.3* 30.5*  MCV 103.1* 103.0* 108.8* 100.0  PLT 210 180 131* 166     Cardiac Enzymes: No results for input(s): CKTOTAL, CKMB, CKMBINDEX, TROPONINI in the last 168 hours.  BNP: Invalid input(s): POCBNP  CBG: Recent Labs  Lab 05/22/21 1124 05/22/21 1629 05/22/21 2022 05/23/21 0830 05/23/21 1203  GLUCAP 103* 78 94 74 71     Microbiology: Results for orders placed or performed during the hospital encounter of 05/21/21  Resp Panel by RT-PCR (Flu A&B, Covid) Nasopharyngeal Swab     Status: None   Collection Time: 05/21/21  3:45 AM   Specimen: Nasopharyngeal Swab; Nasopharyngeal(NP) swabs in vial transport medium  Result Value Ref Range Status   SARS Coronavirus 2 by RT PCR NEGATIVE NEGATIVE Final    Comment: (NOTE) SARS-CoV-2 target nucleic acids are NOT DETECTED.  The SARS-CoV-2 RNA is generally detectable in upper respiratory specimens during the acute phase of infection. The lowest concentration of SARS-CoV-2 viral copies this assay can detect is 138 copies/mL. A negative result does not preclude SARS-Cov-2 infection and should not be used as the sole basis for treatment or other patient management decisions. A negative result may occur with  improper specimen collection/handling, submission of specimen other than nasopharyngeal swab, presence  of viral mutation(s) within the areas targeted by this assay, and inadequate number of viral copies(<138 copies/mL). A negative result must be combined with clinical observations, patient history, and epidemiological information. The expected result is Negative.  Fact Sheet for Patients:  EntrepreneurPulse.com.au  Fact Sheet for Healthcare Providers:  IncredibleEmployment.be  This test is no t yet approved or cleared by the [place] FDA and  has been authorized for detection and/or diagnosis of SARS-CoV-2 by FDA under an Emergency Use Authorization (EUA). This EUA will remain  in effect (meaning this test can be used) for the duration of the COVID-19 declaration under Section 564(b)(1) of the Act, 21 U.S.C.section 360bbb-3(b)(1), unless the authorization is terminated  or revoked sooner.       Influenza A by PCR NEGATIVE NEGATIVE Final   Influenza B by PCR NEGATIVE NEGATIVE Final    Comment: (NOTE) The Xpert Xpress SARS-CoV-2/FLU/RSV plus assay is intended as an aid in the diagnosis of influenza from Nasopharyngeal swab specimens and should not be used as a sole basis for treatment. Nasal washings and aspirates are unacceptable for Xpert Xpress SARS-CoV-2/FLU/RSV testing.  Fact Sheet for Patients: EntrepreneurPulse.com.au  Fact Sheet for Healthcare Providers: IncredibleEmployment.be  This test is not yet approved or cleared by the [place] FDA and has been authorized for detection and/or diagnosis of SARS-CoV-2 by FDA under an Emergency Use Authorization (EUA). This EUA will remain in effect (meaning this test can be used) for the duration of the COVID-19 declaration under Section 564(b)(1) of the Act, 21 U.S.C. section 360bbb-3(b)(1), unless the authorization is terminated or revoked.  Performed at [facility], [address] [place], [place] [address]   Culture, blood (routine  x 2)     Status: Abnormal   Collection Time: 05/21/21  3:45 AM   Specimen: BLOOD  Result Value Ref Range Status   Specimen Description   Final    BLOOD RIGHT ASSIST CONTROL Performed at [facility], [address], [place], [place] [address]    Special Requests   Final    BOTTLES DRAWN AEROBIC AND ANAEROBIC Blood Culture adequate volume Performed at [facility], [address] [place], [place] [address]    Culture  Setup Time   Final    Organism ID to follow GRAM NEGATIVE RODS AEROBIC BOTTLE ONLY IN BOTH AEROBIC AND ANAEROBIC BOTTLES CRITICAL RESULT CALLED TO, READ BACK BY AND VERIFIED WITH: SCHEMA HALLAJI AT 0762 ON 05/22/21 BY SS Performed at [facility], [address] [place], [place] [address]    Culture (A)  Final    SPHINGOBACTERIUM MULTIVORUM Standardized susceptibility testing for this organism is not available. Performed at [facility], [address], [place], [place] [address]    Report Status 05/23/2021 FINAL  Final  Blood Culture ID Panel (Reflexed)     Status: None   Collection Time: 05/21/21  3:45 AM  Result Value Ref Range Status   Enterococcus faecalis NOT DETECTED NOT DETECTED Final   Enterococcus Faecium NOT DETECTED NOT DETECTED Final   Listeria monocytogenes NOT  DETECTED NOT DETECTED Final   Staphylococcus species NOT DETECTED NOT DETECTED Final   Staphylococcus aureus (BCID) NOT DETECTED NOT DETECTED Final   Staphylococcus epidermidis NOT DETECTED NOT DETECTED Final   Staphylococcus lugdunensis NOT DETECTED NOT DETECTED Final   Streptococcus species NOT DETECTED NOT DETECTED Final   Streptococcus agalactiae NOT DETECTED NOT DETECTED Final   Streptococcus pneumoniae NOT DETECTED NOT DETECTED Final   Streptococcus pyogenes NOT DETECTED NOT DETECTED Final   A.calcoaceticus-baumannii NOT DETECTED NOT DETECTED Final   Bacteroides fragilis NOT DETECTED NOT DETECTED Final   Enterobacterales NOT DETECTED NOT DETECTED  Final   Enterobacter cloacae complex NOT DETECTED NOT DETECTED Final   Escherichia coli NOT DETECTED NOT DETECTED Final   Klebsiella aerogenes NOT DETECTED NOT DETECTED Final   Klebsiella oxytoca NOT DETECTED NOT DETECTED Final   Klebsiella pneumoniae NOT DETECTED NOT DETECTED Final   Proteus species NOT DETECTED NOT DETECTED Final   Salmonella species NOT DETECTED NOT DETECTED Final   Serratia marcescens NOT DETECTED NOT DETECTED Final   Haemophilus influenzae NOT DETECTED NOT DETECTED Final   Neisseria meningitidis NOT DETECTED NOT DETECTED Final   Pseudomonas aeruginosa NOT DETECTED NOT DETECTED Final   Stenotrophomonas maltophilia NOT DETECTED NOT DETECTED Final   Candida albicans NOT DETECTED NOT DETECTED Final   Candida auris NOT DETECTED NOT DETECTED Final   Candida glabrata NOT DETECTED NOT DETECTED Final   Candida krusei NOT DETECTED NOT DETECTED Final   Candida parapsilosis NOT DETECTED NOT DETECTED Final   Candida tropicalis NOT DETECTED NOT DETECTED Final   Cryptococcus neoformans/gattii NOT DETECTED NOT DETECTED Final    Comment: Performed at [facility], [address] [place], [place] [address]  Culture, blood (routine x 2)     Status: None (Preliminary result)   Collection Time: 05/21/21  4:05 AM   Specimen: BLOOD  Result Value Ref Range Status   Specimen Description   Final    BLOOD RIGHT HAND Performed at [facility], [address] [place], [place] [address]    Special Requests   Final    BOTTLES DRAWN AEROBIC ONLY Blood Culture results may not be optimal due to an inadequate volume of blood received in culture bottles Performed at [facility], [address] [place], [place] [address]    Culture  Setup Time AEROBIC BOTTLE ONLY GRAM NEGATIVE RODS   Final   Culture GRAM NEGATIVE RODS  Final   Report Status PENDING  Incomplete    Coagulation Studies: No results for input(s): LABPROT, INR in the last 72  hours.  Urinalysis: No results for input(s): COLORURINE, LABSPEC, PHURINE, GLUCOSEU, HGBUR, BILIRUBINUR, KETONESUR, PROTEINUR, UROBILINOGEN, NITRITE, LEUKOCYTESUR in the last 72 hours.  Invalid input(s): APPERANCEUR    Imaging: PERIPHERAL VASCULAR CATHETERIZATION  Result Date: 05/23/2021 See surgical note for result.    Medications:    ceFEPime (MAXIPIME) IV Stopped (05/22/21 1913)     aspirin  81 mg Oral Q M,W,F   calcium carbonate  2 tablet Oral TID WC   calcium-vitamin D  1 tablet Oral Daily   Chlorhexidine Gluconate Cloth  6 each Topical Q0600   clopidogrel  75 mg Oral Daily   [START ON 05/25/2021] epoetin (EPOGEN/PROCRIT) injection  10,000 Units Intravenous Q M,W,F-HD   famotidine       fentaNYL       gabapentin  100 mg Oral BID   heparin  5,000 Units Subcutaneous Q8H   hydrALAZINE  25 mg Oral TID   insulin aspart  0-9 Units Subcutaneous TID WC   levothyroxine  150 mcg Oral Q0600   metoprolol tartrate  50 mg Oral BID   midazolam       neomycin-bacitracin-polymyxin   Topical BID   vitamin B-12  1,000 mcg Oral Daily     Assessment/ Plan:  [name] is a 87 y.o.  female with diabetes, CAD with CABG, chronic respiratory failure 2L, diastolic CHF, hypertension and ESRD on HD. Patient presents to ED with complaints of shortness of breath. She has been admitted for Acute pulmonary edema (HCC) [J81.0] Pulmonary edema [J81.1] Respiratory distress [R06.03] Hypoxia [R09.02] ESRD (end stage renal disease) on dialysis ([place]) [N18.6, Z99.2] Generalized weakness [H15.0] Acute diastolic CHF (congestive heart failure) ([place]) [I50.31] Community acquired pneumonia, unspecified laterality [J18.9]  [place]. MWF LUE AVF 83kg   End stage renal disease on dialysis: hemodialysis treatment yesterday. Tolerated treatment well. Volume status has improved. Next dialysis treatment for tomorrow, continue MWF schedule.  -Appreciate vascular performing fisulogram  today. Narrowing seen but access widely patent. Balloon applied to perianastomotic cephalic vein - Patient remains volume overloaded despite being under her outpatient dry weight. Will continue to challenge with ultrafiltration. Dialysis scheduled for today.   2. Anemia of chronic kidney disease: macrocytic Lab Results  Component Value Date   HGB 9.2 (L) 05/23/2021    - EPO with MWF HD treatments  3. Secondary Hyperparathyroidism: outpatient labs from 05/09/2021: Phos 5.8, calcium 8.1, PTH 418.  - calcium carbonate with meals.   4. Hypertension : continue home regimen of torsemide, hydralazine and metoprolol.   5. Right lower extremity induration and ulcer: examination is concerning for cellulitis. Will discuss with primary team and vascular.    LOS: 2 [place] 8/22/20222:10 PM

## 2021-05-23 NOTE — Progress Notes (Signed)
PT Cancellation Note  Patient Details Name: [name] MRN: [id number] DOB: [birth year]   Cancelled Treatment:    Reason Eval/Treat Not Completed: Other (comment) PT orders received. Pt noted to be off floor at this time. Will f/u as able & as pt is available.  Lavone Nian, PT, DPT 05/23/21, 1:01 PM    [name] 05/23/2021, 1:00 PM

## 2021-05-23 NOTE — Op Note (Signed)
[facility]    OPERATIVE NOTE   PROCEDURE: 1.   Left radiocephalic arteriovenous fistula cannulation under ultrasound guidance 2.   Left arm fistulagram including central venogram 3.   Percutaneous transluminal angioplasty of perianastomotic cephalic vein with 5 mm diameter by 6 cm length Lutonix drug-coated angioplasty balloon  PRE-OPERATIVE DIAGNOSIS: 1. ESRD 2. Poorly functional left brachiocephalic AVF  POST-OPERATIVE DIAGNOSIS: same as above   SURGEON: [name], MD  ANESTHESIA: local with MCS  ESTIMATED BLOOD LOSS: 3 cc  FINDING(S): Narrowing in the perianastomotic cephalic vein was mild to moderate in nature.  This is probably in the 50 to 60% range at worst.  The fistula was then widely patent with mild tortuosity up through the cephalic vein subclavian vein confluence.  The central venous circulation was widely patent.  SPECIMEN(S):  None  CONTRAST: 20 cc  FLUORO TIME: 1.9 minutes  MODERATE CONSCIOUS SEDATION TIME: Approximately 24 minutes with 1 mg of Versed and 50 mcg of Fentanyl   INDICATIONS: [name] is a 87 y.o. female who presents with malfunctioning left brachiocephalic arteriovenous fistula.  The patient is scheduled for left arm fistulagram.  The patient is aware the risks include but are not limited to: bleeding, infection, thrombosis of the cannulated access, and possible anaphylactic reaction to the contrast.  The patient is aware of the risks of the procedure and elects to proceed forward.  DESCRIPTION: After full informed written consent was obtained, the patient was brought back to the angiography suite and placed supine upon the angiography table.  The patient was connected to monitoring equipment. Moderate conscious sedation was administered with a face to face encounter with the patient throughout the procedure with my supervision of the RN administering medicines and monitoring the patient's vital signs and mental  status throughout from the start of the procedure until the patient was taken to the recovery room. The left arm was prepped and draped in the standard fashion for a percutaneous access intervention.  Under ultrasound guidance, the left brachiocephalic arteriovenous fistula was cannulated with a micropuncture needle in a retrograde fashion in the proximal upper arm under direct ultrasound guidance where it was patent and a permanent image was performed.  The microwire was advanced into the fistula and the needle was exchanged for the a microsheath.  I then upsized to a 6 Fr Sheath and imaging was performed after a Kumpe catheter was taken across the brachiocephalic anastomosis and into the brachial artery.  Hand injections were completed to image the access including the central venous system. This demonstrated some narrowing in the perianastomotic cephalic vein was mild to moderate in nature.  This is probably in the 50 to 60% range at worst.  The fistula was then widely patent with mild tortuosity up through the cephalic vein subclavian vein confluence.  The central venous circulation was widely patent.  Based on the images, this fistula is certainly usable but we did elect to balloon the area of narrowing in the perianastomotic cephalic vein  I then crossed the stenosis with a Magic Tourqe wire.  Based on the imaging, a 5 mm x 6 cm Lutonix drug-coated angioplasty balloon was selected.  The balloon was centered around the perianastomotic cephalic vein stenosis and inflated to 12 ATM for 1 minute(s).  On completion imaging, a 25-30% residual stenosis was present.     Based on the completion imaging, no further intervention is necessary.  The wire and balloon were removed from the sheath.  A 4-0 Monocryl purse-string suture was sewn around the sheath.  The sheath was removed while tying down the suture.  A sterile bandage was applied to the puncture site.  COMPLICATIONS: None  CONDITION: Stable   [name]  05/23/2021 12:58 PM   This note was created with Dragon Medical transcription system. Any errors in dictation are purely unintentional.

## 2021-05-23 NOTE — Interval H&P Note (Signed)
History and Physical Interval Note:  05/23/2021 12:20 PM  [name]  has presented today for surgery, with the diagnosis of L BC AVF thrombosis.  The various methods of treatment have been discussed with the patient and family. After consideration of risks, benefits and other options for treatment, the patient has consented to  Procedure(s): DIALYSIS/PERMA CATHETER INSERTION (N/A) A/V SHUNT INTERVENTION (Left) as a surgical intervention.  The patient's history has been reviewed, patient examined, no change in status, stable for surgery.  I have reviewed the patient's chart and labs.  Questions were answered to the patient's satisfaction.     [name]

## 2021-05-23 NOTE — Consult Note (Signed)
NAME: [name]  DOB: 07/22/1934  MRN: [id number]  Date/Time: 05/23/2021 3:47 PM  REQUESTING PROVIDER: Dr>Wouk Subjective:  REASON FOR CONSULT: bacteremia ? [name] is a 87 y.o. with a history of ESRD, hypothyroid, CAD s/p cabg, carotid stenosis, CVA 04/2020, hypothyroidism , staph warneri bacteremia Nov 2021,rt breast ca treated, Rt foot wound presented to the ED from home on 05/21/21  by EMS with sob. Pt went to dialysis the day before but they could not complete the session for clotting of graft. Patient also had a PermCath which dislodged on 08/13.patient did not get dialyzed on Monday, 08/15 because the dialysis staff were concerned that her AV graft was infected as it was warm to touch.  She contacted vascular surgery who cleared her for dialysis thru [facility]. She completed her dialysis session on 08/17 but went for dialysis on 05/20/21 and after 8 minutes her dialysis access clotted and they were unable to continue her treatment.on 8/20 morning she was shouting for help due t o difficulty in breathing and her husband called EMS She did not have any fever, chills, chest pain Had some cough Also has a wound rt shin from an injury due to a zipper Vitals 109/45, temp 97.9, sats 100% HR 88labs wbc 15.2, HB 9.3, PLT 180, cr 5.99 CXR showed pulmonary edema, cardiomegaly Blood culture sent  I am asked to see the patient because of culture being positive for sphingomonas   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  Severe interstitial fibrosis is noted along with severe tubular injury. Patient started on prednisone 20 mg p.o.twice daily .Serologies from September 22 showed ANA positive,p-ANCA positive, titer 1: 640,  low C3, elevated kappa to lambda ratio of 34.6  Past Medical History:  Diagnosis Date   Arthritis    Bilateral Macular degeneration    Breast cancer ([place]) 2012   right breast   Breast mass, right    CAD S/P CABG x 4    a. 09/2012 CABG x 4: LIMA to LAD, SVG to Diag, SVG to OM1, SVG to PDA, EVH from bilateral thighs   Cancer ([place])    breast cancer, right side [id number]   Complication of anesthesia    Coronary artery disease 08/22/2012   sees Dr Rockey Situ   Diabetes [facility])    Diastolic dysfunction    a. 06/2020 Echo: EF 60-65%; b. 08/2020 Echo: EF 60-65%, no rwma, Gr1 DD. Nl RV size/fxn.   DOE (dyspnea on exertion)    ESRD (end stage renal disease) ([place])    Gastritis    hx of   GERD (gastroesophageal reflux disease)    Headache(784.0)    migraines   History of breast cancer    39 treatments of radiation. Negative chemo.   History of seasonal allergies    Hyperlipemia    Hypertension    sees Dr. Fulton Reek   Hypothyroidism    Neuropathy    Personal history of radiation therapy    Pneumonia    hx of   PONV (postoperative nausea and vomiting)    Stroke [facility])    2012   Vertigo     Past Surgical History:  Procedure Laterality Date   A/V FISTULAGRAM Left 01/13/2021   Procedure: A/V FISTULAGRAM;  Surgeon: Algernon Huxley,  MD;  Location: [place] [facility];  Service: Cardiovascular;  Laterality: Left;   ABDOMINAL HYSTERECTOMY     APPENDECTOMY     AV FISTULA PLACEMENT Left 11/04/2020   Procedure: ARTERIOVENOUS (AV) FISTULA CREATION (BRACHIOCEPHALIC);  Surgeon: Algernon Huxley, MD;  Location: [facility] ORS;  Service: Vascular;  Laterality: Left;   BACK SURGERY     BREAST BIOPSY Right    2012 [facility]- [facility]   BREAST LUMPECTOMY Right 2012   F/U radiation    BREAST SURGERY     CARDIAC CATHETERIZATION  2014   CAROTID PTA/STENT INTERVENTION Left 06/14/2020   Procedure: CAROTID PTA/STENT INTERVENTION;  Surgeon: Algernon Huxley, MD;  Location: [place] [facility];  Service: Cardiovascular;   Laterality: Left;   CATARACT EXTRACTION W/PHACO Right 08/14/2017   Procedure: CATARACT EXTRACTION PHACO AND INTRAOCULAR LENS PLACEMENT (IOC);  Surgeon: Birder Robson, MD;  Location: [facility] OR[facility];  Service: Ophthalmology;  Laterality: Right;  [place] 00:43.0 AP% 17.1 CDE 7.37 Fluid Pack lot # [id number]   CATARACT EXTRACTION W/PHACO Left 10/09/2017   Procedure: CATARACT EXTRACTION PHACO AND INTRAOCULAR LENS PLACEMENT (IOC);  Surgeon: Birder Robson, MD;  Location: [facility];  Service: Ophthalmology;  Laterality: Left;  [place] 00:30.3 AP% 11.0 CDE 3.33 Fluid Pack Lot # [id number]   CORONARY ARTERY BYPASS GRAFT  09/05/2012   Procedure: CORONARY ARTERY BYPASS GRAFTING (CABG);  Surgeon: Rexene Alberts, MD;  Location: [place];  Service: Open Heart Surgery;  Laterality: N/A;  CABG x four, using left internal mammary artery and bilateral greater saphenous vein harvested endoscopically   DIALYSIS/PERMA CATHETER INSERTION Left 06/25/2020   Procedure: DIALYSIS/PERMA CATHETER INSERTION;  Surgeon: Algernon Huxley, MD;  Location: [place] [facility];  Service: Cardiovascular;  Laterality: Left;   DIALYSIS/PERMA CATHETER INSERTION N/A 08/10/2020   Procedure: DIALYSIS/PERMA CATHETER INSERTION;  Surgeon: Katha Cabal, MD;  Location: [place] [facility];  Service: Cardiovascular;  Laterality: N/A;   DIALYSIS/PERMA CATHETER REMOVAL N/A 08/05/2020   Procedure: DIALYSIS/PERMA CATHETER REMOVAL;  Surgeon: Algernon Huxley, MD;  Location: [place] [facility];  Service: Cardiovascular;  Laterality: N/A;   INTRAOPERATIVE TRANSESOPHAGEAL ECHOCARDIOGRAM  09/05/2012   Procedure: INTRAOPERATIVE TRANSESOPHAGEAL ECHOCARDIOGRAM;  Surgeon: Rexene Alberts, MD;  Location: [place];  Service: Open Heart Surgery;  Laterality: N/A;   KNEE SURGERY     Bilateral nerve block   LAPAROSCOPIC NISSEN FUNDOPLICATION     OVARIAN CYST REMOVAL      Social History   Socioeconomic History   Marital status: Married    Spouse name: [name] "Bud"   Number of  children: 0   Years of education: Not on file   Highest education level: Not on file  Occupational History   Occupation: retired  Tobacco Use   Smoking status: Former    Years: 3.00    Types: Cigarettes    Quit date: 11/13/1988    Years since quitting: 32.5   Smokeless tobacco: Never   Tobacco comments:    At the most 1 pack would last over a week.  Vaping Use   Vaping Use: Never used  Substance and Sexual Activity   Alcohol use: No   Drug use: No   Sexual activity: Not on file  Other Topics Concern   Not on file  Social History Narrative   Lives at home with spouse    Social Determinants of Health   Financial Resource Strain: Not on file  Food Insecurity: Not on file  Transportation Needs: Not on file  Physical  Activity: Not on file  Stress: Not on file  Social Connections: Not on file  Intimate Partner Violence: Not on file    Family History  Problem Relation Age of Onset   Heart disease Brother        CABG & stents   Hyperlipidemia Brother    Hypertension Brother    Cancer Father    Heart disease Mother    Breast cancer Cousin    Kidney disease Neg Hx    Allergies  Allergen Reactions   Clonidine Other (See Comments) and Shortness Of Breath    Other reaction(s): Other (see comments) Other reaction(s): Other (See Comments)   Darvocet [Propoxyphene N-Acetaminophen] Anaphylaxis   Hydralazine     Other reaction(s): Other (See Comments) glomerulonephritis   Hydralazine Hcl Other (See Comments)    glomerulonephritis   Esomeprazole     Other reaction(s): Unknown   Guaifenesin     Other reaction(s): Unknown Other reaction(s): Unknown Other reaction(s): Unknown Other reaction(s): Unknown   Phenylephrine-Guaifenesin     Other reaction(s): Unknown   Rabeprazole     Other reaction(s): Unknown   Telithromycin     Other reaction(s): Unknown Other reaction(s): Unknown Other reaction(s): Unknown   Fluocinolone Other (See Comments)    Feels crazy Other  reaction(s): Other (see comments) Other reaction(s): Other (See Comments) Feels crazy Feels crazy   Iodinated Diagnostic Agents Nausea And Vomiting and Other (See Comments)    Burning Other reaction(s): Other (see comments) Other reaction(s): Other (See Comments) Burning Nausea & vomiting   Levaquin [Levofloxacin] Nausea Only   Statins Other (See Comments)    Myalgia Other reaction(s): Other (See Comments), Unknown, Unknown Myalgia   I? Current Facility-Administered Medications  Medication Dose Route Frequency Provider Last Rate Last Admin   acetaminophen (TYLENOL) tablet 650 mg  650 mg Oral Q6H PRN Algernon Huxley, MD       Or   acetaminophen (TYLENOL) suppository 650 mg  650 mg Rectal Q6H PRN Algernon Huxley, MD       ALPRAZolam Duanne Moron) tablet 0.5 mg  0.5 mg Oral BID PRN Algernon Huxley, MD   0.5 mg at 05/22/21 2353   aspirin chewable tablet 81 mg  81 mg Oral Q M,W,F Algernon Huxley, MD   81 mg at 05/23/21 1008   calcium carbonate (TUMS - dosed in mg elemental calcium) chewable tablet 400 mg of elemental calcium  2 tablet Oral TID WC Algernon Huxley, MD   400 mg of elemental calcium at 05/23/21 1008   calcium-vitamin D (OSCAL WITH D) 500-200 MG-UNIT per tablet 1 tablet  1 tablet Oral Daily Algernon Huxley, MD   1 tablet at 05/23/21 1008   ceFEPIme (MAXIPIME) 1 g in sodium chloride 0.9 % 100 mL IVPB  1 g Intravenous Q24H Algernon Huxley, MD   Stopping Infusion hung by another clincian at 05/22/21 1913   Chlorhexidine Gluconate Cloth 2 % PADS 6 each  6 each Topical Q0600 Algernon Huxley, MD   6 each at 05/23/21 0542   clopidogrel (PLAVIX) tablet 75 mg  75 mg Oral Daily Algernon Huxley, MD   75 mg at 05/22/21 1209   [START ON 05/25/2021] epoetin alfa (EPOGEN) injection 10,000 Units  10,000 Units Intravenous Q M,W,F-HD Kolluru, Sarath, MD       famotidine (PEPCID) 20 MG tablet            fentaNYL (SUBLIMAZE) 100 MCG/2ML injection  gabapentin (NEURONTIN) capsule 100 mg  100 mg Oral BID Algernon Huxley, MD    100 mg at 05/23/21 1009   heparin injection 5,000 Units  5,000 Units Subcutaneous Q8H Algernon Huxley, MD   5,000 Units at 05/23/21 0630   hydrALAZINE (APRESOLINE) tablet 25 mg  25 mg Oral TID Algernon Huxley, MD   25 mg at 05/23/21 1009   HYDROcodone-acetaminophen (NORCO/VICODIN) 5-325 MG per tablet 1 tablet  1 tablet Oral Q6H PRN Algernon Huxley, MD   1 tablet at 05/23/21 1601   HYDROmorphone (DILAUDID) injection 1 mg  1 mg Intravenous Once PRN Algernon Huxley, MD       insulin aspart (novoLOG) injection 0-9 Units  0-9 Units Subcutaneous TID WC Algernon Huxley, MD       levothyroxine (SYNTHROID) tablet 150 mcg  150 mcg Oral Q0600 Algernon Huxley, MD   150 mcg at 05/22/21 0501   metoprolol tartrate (LOPRESSOR) tablet 50 mg  50 mg Oral BID Algernon Huxley, MD   50 mg at 05/23/21 1009   midazolam (VERSED) 2 MG/2ML injection            neomycin-bacitracin-polymyxin (NEOSPORIN) ointment packet   Topical BID Algernon Huxley, MD   Given at 05/23/21 1009   ondansetron (ZOFRAN) tablet 4 mg  4 mg Oral Q6H PRN Algernon Huxley, MD       Or   ondansetron (ZOFRAN) injection 4 mg  4 mg Intravenous Q6H PRN Algernon Huxley, MD       ondansetron [facility]) injection 4 mg  4 mg Intravenous Q6H PRN Algernon Huxley, MD       vitamin B-12 (CYANOCOBALAMIN) tablet 1,000 mcg  1,000 mcg Oral Daily Algernon Huxley, MD   1,000 mcg at 05/23/21 1008     Abtx:  Anti-infectives (From admission, onward)    Start     Dose/Rate Route Frequency Ordered Stop   05/24/21 0000  ceFAZolin (ANCEF) IVPB 1 g/50 mL premix       Note to Pharmacy: To be given in specials   1 g 100 mL/hr over 30 Minutes Intravenous  Once 05/23/21 1211 05/23/21 1446   05/22/21 1830  ceFEPIme (MAXIPIME) 1 g in sodium chloride 0.9 % 100 mL IVPB        1 g 200 mL/hr over 30 Minutes Intravenous Every 24 hours 05/22/21 1744     05/21/21 0715  cefTRIAXone (ROCEPHIN) 1 g in sodium chloride 0.9 % 100 mL IVPB        1 g 200 mL/hr over 30 Minutes Intravenous  Once 05/21/21 0709 05/21/21 0753    05/21/21 0715  doxycycline (VIBRAMYCIN) 100 mg in sodium chloride 0.9 % 250 mL IVPB        100 mg 125 mL/hr over 120 Minutes Intravenous  Once 05/21/21 0709 05/21/21 1020       REVIEW OF SYSTEMS:  Const: negative fever, negative chills, ++ weight loss Eyes: negative diplopia or visual changes, negative eye pain ENT: negative coryza, negative sore throat Resp: negative cough, hemoptysis, has dyspnea Cards: negative for chest pain, palpitations, lower extremity edema GU: negative for frequency, dysuria and hematuria GI: Negative for abdominal pain, diarrhea, bleeding, constipation Skin: negative for rash and pruritus Heme: negative for easy bruising and gum/nose bleeding MS: generalized weakness  Psych:  anxiety, depression  Says AV graft not working Endocrine: negative for thyroid, diabetes Allergy/Immunology-as above Objective:  VITALS:  BP (!) 97/47   Pulse 79  Temp 98.1 F (36.7 C) (Oral)   Resp 17   Ht 5\' 4"  (1.626 m)   Wt 83.5 kg Comment: pt said she dont stand to good  SpO2 98%   BMI 31.60 kg/m  PHYSICAL EXAM:  General: Alert, not too cooperative, no distress, appears stated age.  Head: Normocephalic, without obvious abnormality, atraumatic. Eyes: Conjunctivae clear, anicteric sclerae. Pupils are equal ENT Nares normal. No drainage or sinus tenderness. Lips, mucosa, and tongue normal. No Thrush Neck: Supple, symmetrical, no adenopathy, thyroid: non tender no carotid bruit and no JVD. Back: No CVA tenderness. Lungs: Clear to auscultation bilaterally. No Wheezing or Rhonchi. No rales. Heart: Regular rate and rhythm, no murmur, rub or gallop. Abdomen: Soft, non-tender,not distended. Bowel sounds normal. No masses Extremities: left AV graft site bruised rt foot dorsal ulcer has healed Superficial wound rt shin with surrounding edema Skin: No rashes or lesions. Or bruising Lymph: Cervical, supraclavicular normal. Neurologic: Grossly non-focal Pertinent Labs Lab  Results CBC    Component Value Date/Time   WBC 7.4 05/23/2021 0636   RBC 3.05 (L) 05/23/2021 0636   HGB 9.2 (L) 05/23/2021 0636   HGB 12.7 05/07/2014 0516   HCT 30.5 (L) 05/23/2021 0636   HCT 37.2 05/07/2014 0516   PLT 166 05/23/2021 0636   PLT 224 05/07/2014 0516   MCV 100.0 05/23/2021 0636   MCV 86 05/07/2014 0516   MCH 30.2 05/23/2021 0636   MCHC 30.2 05/23/2021 0636   RDW 15.4 05/23/2021 0636   RDW 14.1 05/07/2014 0516   LYMPHSABS 0.3 (L) 05/21/2021 0345   LYMPHSABS 2.3 05/07/2014 0516   MONOABS 0.6 05/21/2021 0345   MONOABS 1.1 (H) 05/07/2014 0516   EOSABS 0.0 05/21/2021 0345   EOSABS 0.0 05/07/2014 0516   BASOSABS 0.1 05/21/2021 0345   BASOSABS 0.0 05/07/2014 0516    CMP Latest Ref Rng & Units 05/23/2021 05/22/2021 05/21/2021  Glucose 70 - 99 mg/dL 83 84 156(H)  BUN 8 - 23 mg/dL 45(H) 32(H) 51(H)  Creatinine 0.44 - 1.00 mg/dL 5.08(H) 4.07(H) 5.99(H)  Sodium 135 - 145 mmol/L 134(L) 134(L) 135  Potassium 3.5 - 5.1 mmol/L 4.6 4.5 5.3(H)  Chloride 98 - 111 mmol/L 99 100 102  CO2 22 - 32 mmol/L 28 25 26   Calcium 8.9 - 10.3 mg/dL 7.8(L) 7.8(L) 7.8(L)  Total Protein 6.5 - 8.1 g/dL - - 7.9  Total Bilirubin 0.3 - 1.2 mg/dL - - 0.7  Alkaline Phos 38 - 126 U/L - - 102  AST 15 - 41 U/L - - 14(L)  ALT 0 - 44 U/L - - 8      Microbiology: Recent Results (from the past 240 hour(s))  Resp Panel by RT-PCR (Flu A&B, Covid) Nasopharyngeal Swab     Status: None   Collection Time: 05/21/21  3:45 AM   Specimen: Nasopharyngeal Swab; Nasopharyngeal(NP) swabs in vial transport medium  Result Value Ref Range Status   SARS Coronavirus 2 by RT PCR NEGATIVE NEGATIVE Final    Comment: (NOTE) SARS-CoV-2 target nucleic acids are NOT DETECTED.  The SARS-CoV-2 RNA is generally detectable in upper respiratory specimens during the acute phase of infection. The lowest concentration of SARS-CoV-2 viral copies this assay can detect is 138 copies/mL. A negative result does not preclude  SARS-Cov-2 infection and should not be used as the sole basis for treatment or other patient management decisions. A negative result may occur with  improper specimen collection/handling, submission of specimen other than nasopharyngeal swab, presence of viral mutation(s) within the  areas targeted by this assay, and inadequate number of viral copies(<138 copies/mL). A negative result must be combined with clinical observations, patient history, and epidemiological information. The expected result is Negative.  Fact Sheet for Patients:  EntrepreneurPulse.com.au  Fact Sheet for Healthcare Providers:  IncredibleEmployment.be  This test is no t yet approved or cleared by the [place] FDA and  has been authorized for detection and/or diagnosis of SARS-CoV-2 by FDA under an Emergency Use Authorization (EUA). This EUA will remain  in effect (meaning this test can be used) for the duration of the COVID-19 declaration under Section 564(b)(1) of the Act, 21 U.S.C.section 360bbb-3(b)(1), unless the authorization is terminated  or revoked sooner.       Influenza A by PCR NEGATIVE NEGATIVE Final   Influenza B by PCR NEGATIVE NEGATIVE Final    Comment: (NOTE) The Xpert Xpress SARS-CoV-2/FLU/RSV plus assay is intended as an aid in the diagnosis of influenza from Nasopharyngeal swab specimens and should not be used as a sole basis for treatment. Nasal washings and aspirates are unacceptable for Xpert Xpress SARS-CoV-2/FLU/RSV testing.  Fact Sheet for Patients: EntrepreneurPulse.com.au  Fact Sheet for Healthcare Providers: IncredibleEmployment.be  This test is not yet approved or cleared by the [place] FDA and has been authorized for detection and/or diagnosis of SARS-CoV-2 by FDA under an Emergency Use Authorization (EUA). This EUA will remain in effect (meaning this test can be used) for the duration of  the COVID-19 declaration under Section 564(b)(1) of the Act, 21 U.S.C. section 360bbb-3(b)(1), unless the authorization is terminated or revoked.  Performed at [facility], [address] [place], [place] [address]   Culture, blood (routine x 2)     Status: Abnormal   Collection Time: 05/21/21  3:45 AM   Specimen: BLOOD  Result Value Ref Range Status   Specimen Description   Final    BLOOD RIGHT ASSIST CONTROL Performed at [facility], [address] [place], [place] [address]    Special Requests   Final    BOTTLES DRAWN AEROBIC AND ANAEROBIC Blood Culture adequate volume Performed at [facility], [address] [place], [place] [address]    Culture  Setup Time   Final    Organism ID to follow GRAM NEGATIVE RODS AEROBIC BOTTLE ONLY IN BOTH AEROBIC AND ANAEROBIC BOTTLES CRITICAL RESULT CALLED TO, READ BACK BY AND VERIFIED WITH: SCHEMA HALLAJI AT 5809 ON 05/22/21 BY SS Performed at [facility], [address] [place], [place] [address]    Culture (A)  Final    SPHINGOBACTERIUM MULTIVORUM Standardized susceptibility testing for this organism is not available. Performed at [facility], [place] [address] [place],  [address]    Report Status 05/23/2021 FINAL  Final  Blood Culture ID Panel (Reflexed)     Status: None   Collection Time: 05/21/21  3:45 AM  Result Value Ref Range Status   Enterococcus faecalis NOT DETECTED NOT DETECTED Final   Enterococcus Faecium NOT DETECTED NOT DETECTED Final   Listeria monocytogenes NOT DETECTED NOT DETECTED Final   Staphylococcus species NOT DETECTED NOT DETECTED Final   Staphylococcus aureus (BCID) NOT DETECTED NOT DETECTED Final   Staphylococcus epidermidis NOT DETECTED NOT DETECTED Final   Staphylococcus lugdunensis NOT DETECTED NOT DETECTED Final   Streptococcus species NOT DETECTED NOT DETECTED Final   Streptococcus agalactiae NOT DETECTED NOT DETECTED Final   Streptococcus  pneumoniae NOT DETECTED NOT DETECTED Final   Streptococcus pyogenes NOT DETECTED NOT DETECTED Final  A.calcoaceticus-baumannii NOT DETECTED NOT DETECTED Final   Bacteroides fragilis NOT DETECTED NOT DETECTED Final   Enterobacterales NOT DETECTED NOT DETECTED Final   Enterobacter cloacae complex NOT DETECTED NOT DETECTED Final   Escherichia coli NOT DETECTED NOT DETECTED Final   Klebsiella aerogenes NOT DETECTED NOT DETECTED Final   Klebsiella oxytoca NOT DETECTED NOT DETECTED Final   Klebsiella pneumoniae NOT DETECTED NOT DETECTED Final   Proteus species NOT DETECTED NOT DETECTED Final   Salmonella species NOT DETECTED NOT DETECTED Final   Serratia marcescens NOT DETECTED NOT DETECTED Final   Haemophilus influenzae NOT DETECTED NOT DETECTED Final   Neisseria meningitidis NOT DETECTED NOT DETECTED Final   Pseudomonas aeruginosa NOT DETECTED NOT DETECTED Final   Stenotrophomonas maltophilia NOT DETECTED NOT DETECTED Final   Candida albicans NOT DETECTED NOT DETECTED Final   Candida auris NOT DETECTED NOT DETECTED Final   Candida glabrata NOT DETECTED NOT DETECTED Final   Candida krusei NOT DETECTED NOT DETECTED Final   Candida parapsilosis NOT DETECTED NOT DETECTED Final   Candida tropicalis NOT DETECTED NOT DETECTED Final   Cryptococcus neoformans/gattii NOT DETECTED NOT DETECTED Final    Comment: Performed at [facility], [address] [place], [place] [address]  Culture, blood (routine x 2)     Status: None (Preliminary result)   Collection Time: 05/21/21  4:05 AM   Specimen: BLOOD  Result Value Ref Range Status   Specimen Description   Final    BLOOD RIGHT HAND Performed at [facility], [address] [place], [place] [address]    Special Requests   Final    BOTTLES DRAWN AEROBIC ONLY Blood Culture results may not be optimal due to an inadequate volume of blood received in culture bottles Performed at [facility], [address]  [place], [place] [address]    Culture  Setup Time AEROBIC BOTTLE ONLY GRAM NEGATIVE RODS   Final   Culture GRAM NEGATIVE RODS  Final   Report Status PENDING  Incomplete    IMAGING RESULTS:  I have personally reviewed the films ?CXR showed cardiomegaly, pulmonary edema and pleural effusion left Impression/Recommendation  Acute hypoxia secondary to missing dialysis and having pulmonary edema. Improved after starting dialysis.  ?Sphingomonas bacteremia.  Could be related to the AV graft which Was clotted and its been opened up today Has a superficial wound on the right shin which is less likely to be the source. Agree with giving cefepime for at least 2 weeks. Will check a 2D echo. ? ___End-stage renal disease on dialysis Coronary artery disease status post CABG ________________________________________________ Discussed with patient, requesting provider Note:  This document was prepared using Dragon voice recognition software and may include unintentional dictation errors.

## 2021-05-23 NOTE — Progress Notes (Signed)
Successfully cannulated pt with 15g needles, using the wet stick method. Arterial pressure -180 Venous 160, stenosis present in evaluation of Fistula. Uf goal for the treatment is 3KG. Patient will be monitored closely during her treatment.

## 2021-05-24 ENCOUNTER — Inpatient Hospital Stay (HOSPITAL_COMMUNITY)
Admit: 2021-05-24 | Discharge: 2021-05-24 | Disposition: A | Discharge status: Home / Self Care | Payer: Medicare Other | Attending: Obstetrics and Gynecology | Admitting: Obstetrics and Gynecology

## 2021-05-24 ENCOUNTER — Encounter: Payer: Self-pay | Admitting: Vascular Surgery

## 2021-05-24 DIAGNOSIS — R0902 Hypoxemia: Secondary | ICD-10-CM | POA: Diagnosis not present

## 2021-05-24 DIAGNOSIS — J810 Acute pulmonary edema: Secondary | ICD-10-CM

## 2021-05-24 DIAGNOSIS — R0603 Acute respiratory distress: Secondary | ICD-10-CM

## 2021-05-24 DIAGNOSIS — R7881 Bacteremia: Secondary | ICD-10-CM

## 2021-05-24 DIAGNOSIS — N186 End stage renal disease: Secondary | ICD-10-CM | POA: Diagnosis not present

## 2021-05-24 DIAGNOSIS — I5031 Acute diastolic (congestive) heart failure: Secondary | ICD-10-CM | POA: Diagnosis not present

## 2021-05-24 DIAGNOSIS — R531 Weakness: Secondary | ICD-10-CM

## 2021-05-24 LAB — ECHOCARDIOGRAM COMPLETE
AR max vel: 1.24 cm2
AV Area VTI: 1.51 cm2
AV Area mean vel: 1.37 cm2
AV Mean grad: 9 mmHg
AV Peak grad: 14.3 mmHg
Ao pk vel: 1.89 m/s
Area-P 1/2: 4.1 cm2
Height: 64 in
S' Lateral: 3.58 cm
Weight: 2884.8 oz

## 2021-05-24 LAB — GLUCOSE, CAPILLARY
Glucose-Capillary: 126 mg/dL — ABNORMAL HIGH (ref 70–99)
Glucose-Capillary: 97 mg/dL (ref 70–99)
Glucose-Capillary: 114 mg/dL — ABNORMAL HIGH (ref 70–99)
Glucose-Capillary: 100 mg/dL — ABNORMAL HIGH (ref 70–99)

## 2021-05-24 NOTE — Evaluation (Signed)
Physical Therapy Evaluation Patient Details Name: [name] MRN: [id number] DOB: [birth year] Today's Date: 05/24/2021   History of Present Illness  Pt admitted for acute diastolic CHF with complaints of SOB symptoms. Pt now weaned down to 2L of O2 (chronic). History includes ESRD, CAD s/p CABG, CVA in 7/21, R breast ca, HTN, and HLD.  Clinical Impression  Pt is a pleasant 87 year old female who was admitted for acute diastolic CHF. Of note, temp fem cath placed (8/20) restricting mobility efforts at this time. Able to scoot up in bed with min assist. B LE edema present and pt reports she is unable to wear shoes for some time. Pt demonstrates deficits with strength/mobility/balance/endurance. O2 sats at 94% on 2L of O2 throughout there-ex. Would benefit from skilled PT to address above deficits and promote optimal return to PLOF. Recommend transition to [facility] upon discharge from acute hospitalization.     Follow Up Recommendations Home health PT (pt declining further services)    Equipment Recommendations  None recommended by PT    Recommendations for Other Services       Precautions / Restrictions Precautions Precautions: Fall Restrictions Weight Bearing Restrictions: No      Mobility  Bed Mobility               General bed mobility comments: Pt able to assist for scooting up in bed. Min assist required.    Transfers                 General transfer comment: not performed due to temp fem cath placed for HD. Pt hoping for it to be removed this date  Ambulation/Gait                Stairs            Wheelchair Mobility    Modified Rankin (Stroke Patients Only)       Balance                                             Pertinent Vitals/Pain Pain Assessment: No/denies pain    Home Living Family/patient expects to be discharged to:: Private residence Living Arrangements: Spouse/significant other Available Help at  Discharge: Family;Available 24 hours/day;Other (Comment) (spouse) Type of Home: House Home Access: Stairs to enter Entrance Stairs-Rails: Right Entrance Stairs-Number of Steps: 2 Home Layout: One level Home Equipment: Walker - 2 wheels;Bedside commode;Shower seat      Prior Function Level of Independence: Needs assistance         Comments: reports 1 recent fall. Limited household ambulation performed using RW. Husband provide transportation     Hand Dominance        Extremity/Trunk Assessment   Upper Extremity Assessment Upper Extremity Assessment: Overall WFL for tasks assessed    Lower Extremity Assessment Lower Extremity Assessment: Generalized weakness (B ankle/foot edema present and tender to touch. LE grossly 3/5)       Communication   Communication: No difficulties  Cognition Arousal/Alertness: Awake/alert Behavior During Therapy: WFL for tasks assessed/performed Overall Cognitive Status: Within Functional Limits for tasks assessed                                        General Comments      Exercises Other Exercises  Other Exercises: supine ther-ex performed on B LE including AP, quad sets, hip abd/add, and heel slides. 10 reps with slow movement throughout. Supervision given   Assessment/Plan    PT Assessment Patient needs continued PT services  PT Problem List Decreased strength;Decreased balance;Decreased mobility       PT Treatment Interventions Gait training;Stair training;Therapeutic exercise;Balance training    PT Goals (Current goals can be found in the Care Plan section)  Acute Rehab PT Goals Patient Stated Goal: to stay out of the hospital PT Goal Formulation: With patient Time For Goal Achievement: 06/07/21 Potential to Achieve Goals: Good    Frequency Min 2X/week   Barriers to discharge        Co-evaluation               AM-PAC PT "6 Clicks" Mobility  Outcome Measure Help needed turning from your back  to your side while in a flat bed without using bedrails?: A Little Help needed moving from lying on your back to sitting on the side of a flat bed without using bedrails?: A Little Help needed moving to and from a bed to a chair (including a wheelchair)?: A Little Help needed standing up from a chair using your arms (e.g., wheelchair or bedside chair)?: A Little Help needed to walk in hospital room?: A Lot Help needed climbing 3-5 steps with a railing? : A Lot 6 Click Score: 16    End of Session Equipment Utilized During Treatment: Oxygen Activity Tolerance: Patient tolerated treatment well Patient left: in bed;with bed alarm set;with family/visitor present Nurse Communication: Mobility status PT Visit Diagnosis: Muscle weakness (generalized) (M62.81);Difficulty in walking, not elsewhere classified (R26.2)    Time: 3903-0092 PT Time Calculation (min) (ACUTE ONLY): 27 min   Charges:   PT Evaluation $PT Eval Low Complexity: 1 Low PT Treatments $Therapeutic Exercise: 8-22 mins        Greggory Stallion, PT, DPT [phone]   [name] 05/24/2021, 11:43 AM

## 2021-05-24 NOTE — Progress Notes (Addendum)
PROGRESS NOTE    [name]  OBS:[id number] DOB: 1934/03/06 DOA: 05/21/2021 PCP: Idelle Crouch, MD  Outpatient Specialists: nephrology, cardiology    Brief Narrative:   Admitted for hypoxic respiratory failure 2/2 volume overload 2/2 inadequate dialysis 2/2 clotted left brachial AVF.    Assessment & Plan:   Principal Problem:   Acute diastolic CHF (congestive heart failure) (HCC) Active Problems:   Diabetes mellitus (HCC)   CAD (coronary artery disease)   Hyperkalemia   History of CVA with residual deficit   Ambulatory dysfunction   ESRD (end stage renal disease) on dialysis (HCC)   Pulmonary edema   Macular degeneration   # Acute hypoxic respiratory failure on chronic hypoxic respiratory failure 2/2 volume overload. On 2 L at home. Respiratory status much improved after dialysis yesterday, currently breathing comfortably on 2 L - [place] O2  # Fluid overload 2/2 missed dialysis Resolved. Now back to [facility] dialysis schedule - dialysis tomorrow - torsemide 100 (home med) continuing on non-dialysis days  # ESRD Clotted left AFV. Temporary dialysis catheter placed in left groin. Percutaneous angioplasty with vascular surgery on 8/22. - cont [facility] dialysis - temp vas cath to be removed today  # Bacteremia Unclear if one or 2 blood cultures growing sphingobacterium multivorum, a GNR. ID advises at least 2 wks abx - TTE ordered. ID does not think TEE necessary if TTE unremarkable. - cont cefepime  # HTN Here normotensive - home meds  # Venous stasis dermatitis Right lower extremity swelling greater than left with associated ulceration. Chronic problem, stable, followed outpt by vascular. Do not think this represents infection. Outpatient dopplers neg for DVT.  - outpt f/u  # Macrocytosis B12 wnl  # CAD S/p cabg x4. No chest pain - home aspirin, plavix, statin  # T2DM Here glucose wnl holding home glimepiride - daily fasting sugars  # Hypothyroid - home  synthroid  # History CVA - home meds    DVT prophylaxis: heparin Code Status: dnr Family Communication: husband updated @ bedside 8/23  Level of care: Progressive Cardiac Status is: Inpatient  Remains inpatient appropriate because:Inpatient level of care appropriate due to severity of illness  Dispo: The patient is from: Home              Anticipated d/c is to: Home, will order PT eval              Patient currently is not medically stable to d/c.   Difficult to place patient No        Consultants:  nephrology  Procedures: Temp dialysis catheter placed 8/20  Antimicrobials:  none    Subjective: This morning says breathing is at baseline, no chest pain or cough or fever. Tolerated today's procedure  Objective: Vitals:   05/24/21 0738 05/24/21 0914 05/24/21 0917 05/24/21 1113  BP: (!) 144/61 (!) 132/52 (!) 132/52 127/76  Pulse: 80 86 84 80  Resp: 18  20 19   Temp: 97.6 F (36.4 C)   98.3 F (36.8 C)  TempSrc:    Oral  SpO2: 100%  100% 100%  Weight:      Height:        Intake/Output Summary (Last 24 hours) at 05/24/2021 1236 Last data filed at 05/24/2021 0945 Gross per 24 hour  Intake 240 ml  Output 2000 ml  Net -1760 ml   Filed Weights   05/22/21 0500 05/23/21 1345 05/24/21 0410  Weight: 82.4 kg 83.5 kg 81.8 kg    Examination:  General exam: Appears calm and comfortable. Chronically ill appearing Respiratory system: Clear to auscultation save for rales @ bases Cardiovascular system: S1 & S2 heard, RRR. Mod systolic murmur.  Gastrointestinal system: Abdomen is nondistended, soft and nontender. No organomegaly or masses felt. Normal bowel sounds heard. Central nervous system: Alert and oriented. No focal neurological deficits. Extremities: 2+ pitting edema Skin: echymoses Psychiatry: Judgement and insight appear normal. Mood & affect appropriate.     Data Reviewed: I have personally reviewed following labs and imaging studies  CBC: Recent  Labs  Lab 05/21/21 0345 05/21/21 0700 05/22/21 0450 05/23/21 0636  WBC 13.1* 15.2* 9.8 7.4  NEUTROABS 12.1*  --   --   --   HGB 10.1* 9.3* 9.1* 9.2*  HCT 33.7* 30.7* 32.3* 30.5*  MCV 103.1* 103.0* 108.8* 100.0  PLT 210 180 131* 161   Basic Metabolic Panel: Recent Labs  Lab 05/21/21 0345 05/22/21 0450 05/23/21 0522  NA 135 134* 134*  K 5.3* 4.5 4.6  CL 102 100 99  CO2 26 25 28   GLUCOSE 156* 84 83  BUN 51* 32* 45*  CREATININE 5.99* 4.07* 5.08*  CALCIUM 7.8* 7.8* 7.8*  PHOS  --   --  4.5   GFR: Estimated Creatinine Clearance: 8.5 mL/min (A) (by C-G formula based on SCr of 5.08 mg/dL (H)). Liver Function Tests: Recent Labs  Lab 05/21/21 0345  AST 14*  ALT 8  ALKPHOS 102  BILITOT 0.7  PROT 7.9  ALBUMIN 3.0*   No results for input(s): LIPASE, AMYLASE in the last 168 hours. No results for input(s): AMMONIA in the last 168 hours. Coagulation Profile: No results for input(s): INR, PROTIME in the last 168 hours. Cardiac Enzymes: No results for input(s): CKTOTAL, CKMB, CKMBINDEX, TROPONINI in the last 168 hours. BNP (last 3 results) No results for input(s): PROBNP in the last 8760 hours. HbA1C: No results for input(s): HGBA1C in the last 72 hours.  CBG: Recent Labs  Lab 05/23/21 1842 05/23/21 1845 05/23/21 2112 05/24/21 0740 05/24/21 1114  GLUCAP 182* 180* 182* 126* 97   Lipid Profile: No results for input(s): CHOL, HDL, LDLCALC, TRIG, CHOLHDL, LDLDIRECT in the last 72 hours. Thyroid Function Tests: No results for input(s): TSH, T4TOTAL, FREET4, T3FREE, THYROIDAB in the last 72 hours. Anemia Panel: Recent Labs    05/22/21 1605  VITAMINB12 1,056*   Urine analysis:    Component Value Date/Time   COLORURINE RED (A) 06/23/2020 1025   APPEARANCEUR HAZY (A) 06/23/2020 1025   APPEARANCEUR Clear 05/03/2014 0503   LABSPEC 1.007 06/23/2020 1025   LABSPEC 1.015 05/03/2014 0503   PHURINE  06/23/2020 1025    TEST NOT REPORTED DUE TO COLOR INTERFERENCE OF  URINE PIGMENT   GLUCOSEU (A) 06/23/2020 1025    TEST NOT REPORTED DUE TO COLOR INTERFERENCE OF URINE PIGMENT   GLUCOSEU Negative 05/03/2014 0503   HGBUR (A) 06/23/2020 1025    TEST NOT REPORTED DUE TO COLOR INTERFERENCE OF URINE PIGMENT   BILIRUBINUR (A) 06/23/2020 1025    TEST NOT REPORTED DUE TO COLOR INTERFERENCE OF URINE PIGMENT   BILIRUBINUR Negative 05/03/2014 0503   KETONESUR (A) 06/23/2020 1025    TEST NOT REPORTED DUE TO COLOR INTERFERENCE OF URINE PIGMENT   PROTEINUR (A) 06/23/2020 1025    TEST NOT REPORTED DUE TO COLOR INTERFERENCE OF URINE PIGMENT   UROBILINOGEN 0.2 09/03/2012 1355   NITRITE (A) 06/23/2020 1025    TEST NOT REPORTED DUE TO COLOR INTERFERENCE OF URINE PIGMENT   LEUKOCYTESUR (A)  06/23/2020 1025    TEST NOT REPORTED DUE TO COLOR INTERFERENCE OF URINE PIGMENT   LEUKOCYTESUR Negative 05/03/2014 0503   Sepsis Labs: @LABRCNTIP (procalcitonin:4,lacticidven:4)  ) Recent Results (from the past 240 hour(s))  Resp Panel by RT-PCR (Flu A&B, Covid) Nasopharyngeal Swab     Status: None   Collection Time: 05/21/21  3:45 AM   Specimen: Nasopharyngeal Swab; Nasopharyngeal(NP) swabs in vial transport medium  Result Value Ref Range Status   SARS Coronavirus 2 by RT PCR NEGATIVE NEGATIVE Final    Comment: (NOTE) SARS-CoV-2 target nucleic acids are NOT DETECTED.  The SARS-CoV-2 RNA is generally detectable in upper respiratory specimens during the acute phase of infection. The lowest concentration of SARS-CoV-2 viral copies this assay can detect is 138 copies/mL. A negative result does not preclude SARS-Cov-2 infection and should not be used as the sole basis for treatment or other patient management decisions. A negative result may occur with  improper specimen collection/handling, submission of specimen other than nasopharyngeal swab, presence of viral mutation(s) within the areas targeted by this assay, and inadequate number of viral copies(<138 copies/mL). A negative  result must be combined with clinical observations, patient history, and epidemiological information. The expected result is Negative.  Fact Sheet for Patients:  EntrepreneurPulse.com.au  Fact Sheet for Healthcare Providers:  IncredibleEmployment.be  This test is no t yet approved or cleared by the [place] FDA and  has been authorized for detection and/or diagnosis of SARS-CoV-2 by FDA under an Emergency Use Authorization (EUA). This EUA will remain  in effect (meaning this test can be used) for the duration of the COVID-19 declaration under Section 564(b)(1) of the Act, 21 U.S.C.section 360bbb-3(b)(1), unless the authorization is terminated  or revoked sooner.       Influenza A by PCR NEGATIVE NEGATIVE Final   Influenza B by PCR NEGATIVE NEGATIVE Final    Comment: (NOTE) The Xpert Xpress SARS-CoV-2/FLU/RSV plus assay is intended as an aid in the diagnosis of influenza from Nasopharyngeal swab specimens and should not be used as a sole basis for treatment. Nasal washings and aspirates are unacceptable for Xpert Xpress SARS-CoV-2/FLU/RSV testing.  Fact Sheet for Patients: EntrepreneurPulse.com.au  Fact Sheet for Healthcare Providers: IncredibleEmployment.be  This test is not yet approved or cleared by the [place] FDA and has been authorized for detection and/or diagnosis of SARS-CoV-2 by FDA under an Emergency Use Authorization (EUA). This EUA will remain in effect (meaning this test can be used) for the duration of the COVID-19 declaration under Section 564(b)(1) of the Act, 21 U.S.C. section 360bbb-3(b)(1), unless the authorization is terminated or revoked.  Performed at [facility], [address] [place], [place] [address]   Culture, blood (routine x 2)     Status: Abnormal   Collection Time: 05/21/21  3:45 AM   Specimen: BLOOD  Result Value Ref Range Status   Specimen  Description   Final    BLOOD RIGHT ASSIST CONTROL Performed at [facility], [address] [place], [place] [address]    Special Requests   Final    BOTTLES DRAWN AEROBIC AND ANAEROBIC Blood Culture adequate volume Performed at [facility], [address] [place], [place] [address]    Culture  Setup Time   Final    Organism ID to follow GRAM NEGATIVE RODS AEROBIC BOTTLE ONLY IN BOTH AEROBIC AND ANAEROBIC BOTTLES CRITICAL RESULT CALLED TO, READ BACK BY AND VERIFIED WITH: SCHEMA HALLAJI AT 9030 ON 05/22/21 BY SS Performed at [facility]  [facility], [address] [place], [place] [address]    Culture (A)  Final    [place] Standardized susceptibility testing for this organism is not available. Performed at [facility], [place] [address] [place], [place] [address]    Report Status 05/23/2021 FINAL  Final  Blood Culture ID Panel (Reflexed)     Status: None   Collection Time: 05/21/21  3:45 AM  Result Value Ref Range Status   Enterococcus faecalis NOT DETECTED NOT DETECTED Final   Enterococcus Faecium NOT DETECTED NOT DETECTED Final   Listeria monocytogenes NOT DETECTED NOT DETECTED Final   Staphylococcus species NOT DETECTED NOT DETECTED Final   Staphylococcus aureus (BCID) NOT DETECTED NOT DETECTED Final   Staphylococcus epidermidis NOT DETECTED NOT DETECTED Final   Staphylococcus lugdunensis NOT DETECTED NOT DETECTED Final   Streptococcus species NOT DETECTED NOT DETECTED Final   Streptococcus agalactiae NOT DETECTED NOT DETECTED Final   Streptococcus pneumoniae NOT DETECTED NOT DETECTED Final   Streptococcus pyogenes NOT DETECTED NOT DETECTED Final   A.calcoaceticus-baumannii NOT DETECTED NOT DETECTED Final   Bacteroides fragilis NOT DETECTED NOT DETECTED Final   Enterobacterales NOT DETECTED NOT DETECTED Final   Enterobacter cloacae complex NOT DETECTED NOT DETECTED Final   Escherichia coli NOT DETECTED NOT DETECTED Final    Klebsiella aerogenes NOT DETECTED NOT DETECTED Final   Klebsiella oxytoca NOT DETECTED NOT DETECTED Final   Klebsiella pneumoniae NOT DETECTED NOT DETECTED Final   Proteus species NOT DETECTED NOT DETECTED Final   Salmonella species NOT DETECTED NOT DETECTED Final   Serratia marcescens NOT DETECTED NOT DETECTED Final   Haemophilus influenzae NOT DETECTED NOT DETECTED Final   Neisseria meningitidis NOT DETECTED NOT DETECTED Final   Pseudomonas aeruginosa NOT DETECTED NOT DETECTED Final   Stenotrophomonas maltophilia NOT DETECTED NOT DETECTED Final   Candida albicans NOT DETECTED NOT DETECTED Final   Candida auris NOT DETECTED NOT DETECTED Final   Candida glabrata NOT DETECTED NOT DETECTED Final   Candida krusei NOT DETECTED NOT DETECTED Final   Candida parapsilosis NOT DETECTED NOT DETECTED Final   Candida tropicalis NOT DETECTED NOT DETECTED Final   Cryptococcus neoformans/gattii NOT DETECTED NOT DETECTED Final    Comment: Performed at [facility], [address] [place], [place] [address]  Culture, blood (routine x 2)     Status: Abnormal   Collection Time: 05/21/21  4:05 AM   Specimen: BLOOD  Result Value Ref Range Status   Specimen Description   Final    BLOOD RIGHT HAND Performed at [facility], [address] [place], [place] [address]    Special Requests   Final    BOTTLES DRAWN AEROBIC ONLY Blood Culture results may not be optimal due to an inadequate volume of blood received in culture bottles Performed at [facility], [address] [place], [place] [address]    Culture  Setup Time AEROBIC BOTTLE ONLY GRAM NEGATIVE RODS   Final   Culture (A)  Final    SPHINGOBACTERIUM MULTIVORUM Standardized susceptibility testing for this organism is not available. Performed at [facility], [place] [address] [place], [place] [address]    Report Status 05/24/2021 FINAL  Final         Radiology Studies: PERIPHERAL VASCULAR  CATHETERIZATION  Result Date: 05/23/2021 See surgical note for result.       Scheduled Meds:  aspirin  81 mg Oral Q M,W,F   calcium carbonate  2 tablet Oral TID WC  calcium-vitamin D  1 tablet Oral Daily   Chlorhexidine Gluconate Cloth  6 each Topical Q0600   clopidogrel  75 mg Oral Daily   gabapentin  100 mg Oral BID   heparin  5,000 Units Subcutaneous Q8H   hydrALAZINE  25 mg Oral TID   insulin aspart  0-9 Units Subcutaneous TID WC   levothyroxine  150 mcg Oral Q0600   metoprolol tartrate  50 mg Oral BID   neomycin-bacitracin-polymyxin   Topical BID   vitamin B-12  1,000 mcg Oral Daily   Continuous Infusions:  ceFEPime (MAXIPIME) IV 1 g (05/23/21 1854)     LOS: 3 days    Time spent: 30 min    Desma Maxim, MD [facility]   If 7PM-7AM, please contact night-coverage [email or web address] Password [facility] 05/24/2021, 12:36 PM

## 2021-05-24 NOTE — Care Management Important Message (Signed)
Important Message  Patient Details  Name: [name] MRN: [id number] Date of Birth: [birth year]   Medicare Important Message Given:  Yes     [name] 05/24/2021, 11:40 AM

## 2021-05-24 NOTE — Progress Notes (Signed)
[facility]  ROUNDING NOTE   Subjective:   [name] is a 87 y.o. white female with diabetes, CAD with CABG, chronic respiratory failure 2L, diastolic CHF, hypertension and ESRD on HD. Patient presents to ED with complaints of shortness of breath. She has been admitted for Acute pulmonary edema (HCC) [J81.0] Pulmonary edema [J81.1] Respiratory distress [R06.03] Hypoxia [R09.02] ESRD (end stage renal disease) on dialysis ([place]) [N18.6, Z99.2] Generalized weakness [W25.8] Acute diastolic CHF (congestive heart failure) ([place]) [I50.31] Community acquired pneumonia, unspecified laterality [J18.9]  Left femoral temp HD catheter placed 05/21/21 by Dr. Bridgett Larsson, Vascular Surgery, but not used for dialysis  Patient seen resting in bed Husband at bedside Tolerating meals Denies shortness of breath  Dialysis yesterday went well using AVF Angiogram completed by Dr Lucky Cowboy, access patent with mild narrowing at anastomosis  Objective:  Vital signs in last 24 hours:  Temp:  [97.6 F (36.4 C)-98.3 F (36.8 C)] 97.6 F (36.4 C) (08/23 0738) Pulse Rate:  [71-95] 84 (08/23 0917) Resp:  [10-25] 20 (08/23 0917) BP: (96-144)/(47-81) 132/52 (08/23 0917) SpO2:  [95 %-100 %] 100 % (08/23 0917) Weight:  [81.8 kg-83.5 kg] 81.8 kg (08/23 0410)  Weight change:  Filed Weights   05/22/21 0500 05/23/21 1345 05/24/21 0410  Weight: 82.4 kg 83.5 kg 81.8 kg    Intake/Output: I/O last 3 completed shifts: In: 56 [P.O.:120; IV Piggyback:100] Out: 2100 [Urine:100; Other:2000]   Intake/Output this shift:  No intake/output data recorded.  Physical Exam: General: NAD, laying in bed  Head: Normocephalic, atraumatic. Moist oral mucosal membranes  Eyes: Anicteric  Lungs:  Clear, normal effort  Heart: Regular rate and rhythm  Abdomen:  Soft, nontender  Extremities:  + peripheral edema. BLE +erythema, +induration, tender to palpation  Neurologic: Nonfocal, moving all four extremities  Skin:  No lesions  Access: Lt AVF with thrill and bruit, Rt Permcath, left femoral temp HD catheter 8/20 Dr. Bridgett Larsson    Basic Metabolic Panel: Recent Labs  Lab 05/21/21 0345 05/22/21 0450 05/23/21 0522  NA 135 134* 134*  K 5.3* 4.5 4.6  CL 102 100 99  CO2 26 25 28   GLUCOSE 156* 84 83  BUN 51* 32* 45*  CREATININE 5.99* 4.07* 5.08*  CALCIUM 7.8* 7.8* 7.8*  PHOS  --   --  4.5     Liver Function Tests: Recent Labs  Lab 05/21/21 0345  AST 14*  ALT 8  ALKPHOS 102  BILITOT 0.7  PROT 7.9  ALBUMIN 3.0*    No results for input(s): LIPASE, AMYLASE in the last 168 hours. No results for input(s): AMMONIA in the last 168 hours.  CBC: Recent Labs  Lab 05/21/21 0345 05/21/21 0700 05/22/21 0450 05/23/21 0636  WBC 13.1* 15.2* 9.8 7.4  NEUTROABS 12.1*  --   --   --   HGB 10.1* 9.3* 9.1* 9.2*  HCT 33.7* 30.7* 32.3* 30.5*  MCV 103.1* 103.0* 108.8* 100.0  PLT 210 180 131* 166     Cardiac Enzymes: No results for input(s): CKTOTAL, CKMB, CKMBINDEX, TROPONINI in the last 168 hours.  BNP: Invalid input(s): POCBNP  CBG: Recent Labs  Lab 05/23/21 1203 05/23/21 1842 05/23/21 1845 05/23/21 2112 05/24/21 0740  GLUCAP 71 182* 180* 182* 126*     Microbiology: Results for orders placed or performed during the hospital encounter of 05/21/21  Resp Panel by RT-PCR (Flu A&B, Covid) Nasopharyngeal Swab     Status: None   Collection Time: 05/21/21  3:45 AM   Specimen: Nasopharyngeal  Swab; Nasopharyngeal(NP) swabs in vial transport medium  Result Value Ref Range Status   SARS Coronavirus 2 by RT PCR NEGATIVE NEGATIVE Final    Comment: (NOTE) SARS-CoV-2 target nucleic acids are NOT DETECTED.  The SARS-CoV-2 RNA is generally detectable in upper respiratory specimens during the acute phase of infection. The lowest concentration of SARS-CoV-2 viral copies this assay can detect is 138 copies/mL. A negative result does not preclude SARS-Cov-2 infection and should not be used as the sole  basis for treatment or other patient management decisions. A negative result may occur with  improper specimen collection/handling, submission of specimen other than nasopharyngeal swab, presence of viral mutation(s) within the areas targeted by this assay, and inadequate number of viral copies(<138 copies/mL). A negative result must be combined with clinical observations, patient history, and epidemiological information. The expected result is Negative.  Fact Sheet for Patients:  EntrepreneurPulse.com.au  Fact Sheet for Healthcare Providers:  IncredibleEmployment.be  This test is no t yet approved or cleared by the [place] FDA and  has been authorized for detection and/or diagnosis of SARS-CoV-2 by FDA under an Emergency Use Authorization (EUA). This EUA will remain  in effect (meaning this test can be used) for the duration of the COVID-19 declaration under Section 564(b)(1) of the Act, 21 U.S.C.section 360bbb-3(b)(1), unless the authorization is terminated  or revoked sooner.       Influenza A by PCR NEGATIVE NEGATIVE Final   Influenza B by PCR NEGATIVE NEGATIVE Final    Comment: (NOTE) The Xpert Xpress SARS-CoV-2/FLU/RSV plus assay is intended as an aid in the diagnosis of influenza from Nasopharyngeal swab specimens and should not be used as a sole basis for treatment. Nasal washings and aspirates are unacceptable for Xpert Xpress SARS-CoV-2/FLU/RSV testing.  Fact Sheet for Patients: EntrepreneurPulse.com.au  Fact Sheet for Healthcare Providers: IncredibleEmployment.be  This test is not yet approved or cleared by the [place] FDA and has been authorized for detection and/or diagnosis of SARS-CoV-2 by FDA under an Emergency Use Authorization (EUA). This EUA will remain in effect (meaning this test can be used) for the duration of the COVID-19 declaration under Section 564(b)(1) of the Act,  21 U.S.C. section 360bbb-3(b)(1), unless the authorization is terminated or revoked.  Performed at [facility], [address] [place], [place] [address]   Culture, blood (routine x 2)     Status: Abnormal   Collection Time: 05/21/21  3:45 AM   Specimen: BLOOD  Result Value Ref Range Status   Specimen Description   Final    BLOOD RIGHT ASSIST CONTROL Performed at [facility], [address], [place], [place] [address]    Special Requests   Final    BOTTLES DRAWN AEROBIC AND ANAEROBIC Blood Culture adequate volume Performed at [facility], [address] [place], [place] [address]    Culture  Setup Time   Final    Organism ID to follow GRAM NEGATIVE RODS AEROBIC BOTTLE ONLY IN BOTH AEROBIC AND ANAEROBIC BOTTLES CRITICAL RESULT CALLED TO, READ BACK BY AND VERIFIED WITH: SCHEMA HALLAJI AT 9678 ON 05/22/21 BY SS Performed at [facility], [address] [place], [place] [address]    Culture (A)  Final    SPHINGOBACTERIUM MULTIVORUM Standardized susceptibility testing for this organism is not available. Performed at [facility], [place], [place], [place] [address]    Report Status 05/23/2021 FINAL  Final  Blood Culture ID Panel (Reflexed)     Status: None  Collection Time: 05/21/21  3:45 AM  Result Value Ref Range Status   Enterococcus faecalis NOT DETECTED NOT DETECTED Final   Enterococcus Faecium NOT DETECTED NOT DETECTED Final   Listeria monocytogenes NOT DETECTED NOT DETECTED Final   Staphylococcus species NOT DETECTED NOT DETECTED Final   Staphylococcus aureus (BCID) NOT DETECTED NOT DETECTED Final   Staphylococcus epidermidis NOT DETECTED NOT DETECTED Final   Staphylococcus lugdunensis NOT DETECTED NOT DETECTED Final   Streptococcus species NOT DETECTED NOT DETECTED Final   Streptococcus agalactiae NOT DETECTED NOT DETECTED Final   Streptococcus pneumoniae NOT DETECTED NOT DETECTED Final   Streptococcus  pyogenes NOT DETECTED NOT DETECTED Final   A.calcoaceticus-baumannii NOT DETECTED NOT DETECTED Final   Bacteroides fragilis NOT DETECTED NOT DETECTED Final   Enterobacterales NOT DETECTED NOT DETECTED Final   Enterobacter cloacae complex NOT DETECTED NOT DETECTED Final   Escherichia coli NOT DETECTED NOT DETECTED Final   Klebsiella aerogenes NOT DETECTED NOT DETECTED Final   Klebsiella oxytoca NOT DETECTED NOT DETECTED Final   Klebsiella pneumoniae NOT DETECTED NOT DETECTED Final   Proteus species NOT DETECTED NOT DETECTED Final   Salmonella species NOT DETECTED NOT DETECTED Final   Serratia marcescens NOT DETECTED NOT DETECTED Final   Haemophilus influenzae NOT DETECTED NOT DETECTED Final   Neisseria meningitidis NOT DETECTED NOT DETECTED Final   Pseudomonas aeruginosa NOT DETECTED NOT DETECTED Final   Stenotrophomonas maltophilia NOT DETECTED NOT DETECTED Final   Candida albicans NOT DETECTED NOT DETECTED Final   Candida auris NOT DETECTED NOT DETECTED Final   Candida glabrata NOT DETECTED NOT DETECTED Final   Candida krusei NOT DETECTED NOT DETECTED Final   Candida parapsilosis NOT DETECTED NOT DETECTED Final   Candida tropicalis NOT DETECTED NOT DETECTED Final   Cryptococcus neoformans/gattii NOT DETECTED NOT DETECTED Final    Comment: Performed at [facility], [address] [place], [place] [address]  Culture, blood (routine x 2)     Status: None (Preliminary result)   Collection Time: 05/21/21  4:05 AM   Specimen: BLOOD  Result Value Ref Range Status   Specimen Description   Final    BLOOD RIGHT HAND Performed at [facility], [address] [place], [place] [address]    Special Requests   Final    BOTTLES DRAWN AEROBIC ONLY Blood Culture results may not be optimal due to an inadequate volume of blood received in culture bottles Performed at [facility] [address] [place], [place] [address]    Culture  Setup Time AEROBIC BOTTLE  ONLY GRAM NEGATIVE RODS   Final   Culture   Final    GRAM NEGATIVE RODS IDENTIFICATION TO FOLLOW Performed at [facility], [place] [address] [place], [place] [address]    Report Status PENDING  Incomplete    Coagulation Studies: No results for input(s): LABPROT, INR in the last 72 hours.  Urinalysis: No results for input(s): COLORURINE, LABSPEC, PHURINE, GLUCOSEU, HGBUR, BILIRUBINUR, KETONESUR, PROTEINUR, UROBILINOGEN, NITRITE, LEUKOCYTESUR in the last 72 hours.  Invalid input(s): APPERANCEUR    Imaging: PERIPHERAL VASCULAR CATHETERIZATION  Result Date: 05/23/2021 See surgical note for result.    Medications:    ceFEPime (MAXIPIME) IV 1 g (05/23/21 1854)     aspirin  81 mg Oral Q M,W,F   calcium carbonate  2 tablet Oral TID WC   calcium-vitamin D  1 tablet Oral Daily   Chlorhexidine Gluconate Cloth  6 each Topical Q0600   clopidogrel  75 mg Oral  Daily   gabapentin  100 mg Oral BID   heparin  5,000 Units Subcutaneous Q8H   hydrALAZINE  25 mg Oral TID   insulin aspart  0-9 Units Subcutaneous TID WC   levothyroxine  150 mcg Oral Q0600   metoprolol tartrate  50 mg Oral BID   neomycin-bacitracin-polymyxin   Topical BID   vitamin B-12  1,000 mcg Oral Daily     Assessment/ Plan:  Ms. EZMA REHM is a 87 y.o.  female with diabetes, CAD with CABG, chronic respiratory failure 2L, diastolic CHF, hypertension and ESRD on HD. Patient presents to ED with complaints of shortness of breath. She has been admitted for Acute pulmonary edema (HCC) [J81.0] Pulmonary edema [J81.1] Respiratory distress [R06.03] Hypoxia [R09.02] ESRD (end stage renal disease) on dialysis ([place]) [N18.6, Z99.2] Generalized weakness [H47.6] Acute diastolic CHF (congestive heart failure) ([place]) [I50.31] Community acquired pneumonia, unspecified laterality [J18.9]  [place]. MWF LUE AVF 83kg   End stage renal disease on dialysis: hemodialysis treatment yesterday. Tolerated  treatment well. Volume status has improved. Next dialysis treatment for tomorrow, continue MWF schedule.  -Appreciate vascular performing fisulogram today. Narrowing seen but access widely patent. Balloon applied to perianastomotic cephalic vein - Received dialysis yesterday, tolerated well. UF 2L achieved - Next treatment scheduled for Wednesday    2. Anemia of chronic kidney disease: macrocytic Lab Results  Component Value Date   HGB 9.2 (L) 05/23/2021    - EPO with MWF HD treatments  3. Secondary Hyperparathyroidism: outpatient labs from 05/09/2021: Phos 5.8, calcium 8.1, PTH 418.  - calcium carbonate with meals.   4. Hypertension : continue home regimen of torsemide, hydralazine and metoprolol.   5. Right lower extremity induration and ulcer: examination is concerning for cellulitis. Plan to follow up outpatient with vascular   LOS: 3 [place] 8/23/202210:05 AM

## 2021-05-24 NOTE — Progress Notes (Signed)
*  PRELIMINARY RESULTS* Echocardiogram 2D Echocardiogram has been performed.  Sherrie Sport 05/24/2021, 2:31 PM

## 2021-05-24 NOTE — Progress Notes (Signed)
Left  femoral Hemodialysis catheter removed with assistance of Adria Dill, RN (IV team RN), pressure maintained for 30 minutes patient tolerated well no bleeding noted at site.

## 2021-05-24 NOTE — Progress Notes (Signed)
[name] & Vascular Surgery Daily Progress Note  05/23/21: 1.   Left radiocephalic arteriovenous fistula cannulation under ultrasound guidance 2.   Left arm fistulagram including central venogram 3.   Percutaneous transluminal angioplasty of perianastomotic cephalic Weiss with 5 mm diameter by 6 cm length Lutonix drug-coated angioplasty balloon  Subjective: Patient without complaint this afternoon.  Had dialysis via her left upper extremity access yesterday evening without issue.  No acute issues overnight.  Objective: Vitals:   05/24/21 0738 05/24/21 0914 05/24/21 0917 05/24/21 1113  BP: (!) 144/61 (!) 132/52 (!) 132/52 127/76  Pulse: 80 86 84 80  Resp: 18  20 19   Temp: 97.6 F (36.4 C)   98.3 F (36.8 C)  TempSrc:    Oral  SpO2: 100%  100% 100%  Weight:      Height:        Intake/Output Summary (Last 24 hours) at 05/24/2021 1322 Last data filed at 05/24/2021 0945 Gross per 24 hour  Intake 240 ml  Output 2000 ml  Net -1760 ml   Physical Exam: A&Ox3, NAD CV: RRR Pulmonary: CTA Bilaterally Abdomen: Soft, Nontender, Nondistended Vascular:  Left upper extremity: Access site is clean dry and intact.  Good bruit and thrill.  No acute vascular compromise noted at this time.   Laboratory: CBC    Component Value Date/Time   WBC 7.4 05/23/2021 0636   HGB 9.2 (L) 05/23/2021 0636   HGB 12.7 05/07/2014 0516   HCT 30.5 (L) 05/23/2021 0636   HCT 37.2 05/07/2014 0516   PLT 166 05/23/2021 0636   PLT 224 05/07/2014 0516   BMET    Component Value Date/Time   NA 134 (L) 05/23/2021 0522   NA 140 11/22/2015 0830   NA 142 05/07/2014 0516   K 4.6 05/23/2021 0522   K 3.2 (L) 05/07/2014 0516   CL 99 05/23/2021 0522   CL 104 05/07/2014 0516   CO2 28 05/23/2021 0522   CO2 29 05/07/2014 0516   GLUCOSE 83 05/23/2021 0522   GLUCOSE 64 (L) 05/07/2014 0516   BUN 45 (H) 05/23/2021 0522   BUN 35 (H) 11/22/2015 0830   BUN 26 (H) 05/07/2014 0516   CREATININE 5.08 (H) 05/23/2021  0522   CREATININE 1.13 05/07/2014 0516   CALCIUM 7.8 (L) 05/23/2021 0522   CALCIUM 7.9 (L) 05/07/2014 0516   GFRNONAA 8 (L) 05/23/2021 0522   GFRNONAA 47 (L) 05/07/2014 0516   GFRAA 6 (L) 06/30/2020 0786   GFRAA 54 (L) 05/07/2014 0516   Assessment/Planning: The patient is an 87 year old female with known history of end-stage renal disease who presented with a clotted left upper extremity dialysis access status post endovascular revascularization  1) patient was able to dialyze without issue from her left upper extremity dialysis access yesterday evening. 2) OK to remove the patient's temporary dialysis catheter. 3) we will see the patient in our office in approximately one month to continue to surveilled her left upper extremity. 4) from a vascular standpoint, the patient can be discharged home when medically stable.  No further recommendations from vascular at this time.  Vascular surgery to sign off.  Discussed with Dr. Ellis Parents Fayth Trefry PA-C 05/24/2021 1:22 PM

## 2021-05-25 DIAGNOSIS — R7881 Bacteremia: Secondary | ICD-10-CM | POA: Diagnosis not present

## 2021-05-25 DIAGNOSIS — I5031 Acute diastolic (congestive) heart failure: Secondary | ICD-10-CM | POA: Diagnosis not present

## 2021-05-25 LAB — CULTURE, BLOOD (ROUTINE X 2): Special Requests: ADEQUATE

## 2021-05-25 LAB — CBC
HCT: 28.4 % — ABNORMAL LOW (ref 36.0–46.0)
Hemoglobin: 8.7 g/dL — ABNORMAL LOW (ref 12.0–15.0)
MCH: 30.4 pg (ref 26.0–34.0)
MCHC: 30.6 g/dL (ref 30.0–36.0)
MCV: 99.3 fL (ref 80.0–100.0)
Platelets: 215 10*3/uL (ref 150–400)
RBC: 2.86 MIL/uL — ABNORMAL LOW (ref 3.87–5.11)
RDW: 15.1 % (ref 11.5–15.5)
WBC: 5.4 10*3/uL (ref 4.0–10.5)
nRBC: 0 % (ref 0.0–0.2)

## 2021-05-25 LAB — BASIC METABOLIC PANEL
Anion gap: 8 (ref 5–15)
BUN: 50 mg/dL — ABNORMAL HIGH (ref 8–23)
CO2: 28 mmol/L (ref 22–32)
Calcium: 7.8 mg/dL — ABNORMAL LOW (ref 8.9–10.3)
Chloride: 96 mmol/L — ABNORMAL LOW (ref 98–111)
Creatinine, Ser: 4.96 mg/dL — ABNORMAL HIGH (ref 0.44–1.00)
GFR, Estimated: 8 mL/min — ABNORMAL LOW (ref >60)
Glucose, Bld: 84 mg/dL (ref 70–99)
Potassium: 4.1 mmol/L (ref 3.5–5.1)
Sodium: 132 mmol/L — ABNORMAL LOW (ref 135–145)

## 2021-05-25 LAB — GLUCOSE, CAPILLARY
Glucose-Capillary: 78 mg/dL (ref 70–99)
Glucose-Capillary: 92 mg/dL (ref 70–99)

## 2021-05-25 MED ORDER — BACITRACIN-NEOMYCIN-POLYMYXIN 400-5-5000 EX OINT: TOPICAL_OINTMENT | Freq: Two times a day (BID) | CUTANEOUS | 0 refills | Status: DC

## 2021-05-25 MED ORDER — SODIUM CHLORIDE 0.9 % IV SOLN: 1.0000 g | Freq: Once | INTRAVENOUS | Status: DC

## 2021-05-25 MED ORDER — EPOETIN ALFA 10000 UNIT/ML IJ SOLN: 4000.0000 [IU] | INTRAMUSCULAR | Status: DC

## 2021-05-25 MED ORDER — EPOETIN ALFA 4000 UNIT/ML IJ SOLN
INTRAMUSCULAR | Status: AC
  Filled 2021-05-25: qty 1

## 2021-05-25 MED ORDER — CEFEPIME IV (FOR PTA / DISCHARGE USE ONLY): 2.0000 g | INTRAVENOUS | 0 refills | Status: AC

## 2021-05-25 MED ORDER — TORSEMIDE 100 MG PO TABS
100.0000 mg | ORAL_TABLET | ORAL | 0 refills | Status: DC
Start: 2021-05-25 — End: 2021-08-09

## 2021-05-25 MED ORDER — SODIUM CHLORIDE 0.9 % IV SOLN
2.0000 g | Freq: Once | INTRAVENOUS | Status: AC
  Administered 2021-05-25: 2 g via INTRAVENOUS
  Filled 2021-05-25: qty 2

## 2021-05-25 MED ORDER — CEFTAZIDIME IV (FOR PTA / DISCHARGE USE ONLY): 2.0000 g | INTRAVENOUS | 0 refills | Status: DC

## 2021-05-25 NOTE — Progress Notes (Signed)
ID: [name] is a 87 y.o. female Principal Problem:   Acute diastolic CHF (congestive heart failure) (HCC) Active Problems:   Diabetes mellitus (HCC)   CAD (coronary artery disease)   Hyperkalemia   History of CVA with residual deficit   Ambulatory dysfunction   ESRD (end stage renal disease) on dialysis (HCC)   Pulmonary edema   Macular degeneration    Subjective: Pt feeling better Wants to go home Getting dialysis  Medications:   aspirin  81 mg Oral Q M,W,F   calcium carbonate  2 tablet Oral TID WC   calcium-vitamin D  1 tablet Oral Daily   Chlorhexidine Gluconate Cloth  6 each Topical Q0600   clopidogrel  75 mg Oral Daily   gabapentin  100 mg Oral BID   heparin  5,000 Units Subcutaneous Q8H   hydrALAZINE  25 mg Oral TID   insulin aspart  0-9 Units Subcutaneous TID WC   levothyroxine  150 mcg Oral Q0600   metoprolol tartrate  50 mg Oral BID   neomycin-bacitracin-polymyxin   Topical BID   vitamin B-12  1,000 mcg Oral Daily    Objective: Vital signs in last 24 hours: Temp:  [97.4 F (36.3 C)-97.8 F (36.6 C)] 97.4 F (36.3 C) (08/24 0601) Pulse Rate:  [61-91] 91 (08/24 1126) Resp:  [16-20] 19 (08/24 1126) BP: (109-149)/(44-67) 144/63 (08/24 1126) SpO2:  [91 %-100 %] 91 % (08/24 1126) Weight:  [83.2 kg] 83.2 kg (08/24 0601)  PHYSICAL EXAM:  General: Alert, cooperative, no distress, appears stated age.  Head: Normocephalic, without obvious abnormality, atraumatic. Eyes: Conjunctivae clear, anicteric sclerae. Pupils are equal ENT Nares normal. No drainage or sinus tenderness. Lips, mucosa, and tongue normal. No Thrush Neck: Supple, symmetrical, no adenopathy, thyroid: non tender no carotid bruit and no JVD. Lungs: Clear to auscultation bilaterally. No Wheezing or Rhonchi. No rales. Heart: 2/6 systolic murmur Abdomen: Soft, non-tender,not distended. Bowel sounds normal. No masses Extremities: left AV graft working well Rt shin superficial  wound Skin: No rashes or lesions. Or bruising Lymph: Cervical, supraclavicular normal. Neurologic: Grossly non-focal  Lab Results Recent Labs    05/23/21 0522 05/23/21 0636 05/25/21 0541  WBC  --  7.4 5.4  HGB  --  9.2* 8.7*  HCT  --  30.5* 28.4*  NA 134*  --  132*  K 4.6  --  4.1  CL 99  --  96*  CO2 28  --  28  BUN 45*  --  50*  CREATININE 5.08*  --  4.96*   Liver Panel No results for input(s): PROT, ALBUMIN, AST, ALT, ALKPHOS, BILITOT, BILIDIR, IBILI in the last 72 hours. Sedimentation Rate No results for input(s): ESRSEDRATE in the last 72 hours. C-Reactive Protein No results for input(s): CRP in the last 72 hours.  Microbiology:  Studies/Results: ECHOCARDIOGRAM COMPLETE  Result Date: 05/24/2021    ECHOCARDIOGRAM REPORT   Patient Name:   [name] Date of Exam: 05/24/2021 Medical Rec #:  [id number]         Height:       64.0 in Accession #:    [id number]        Weight:       180.3 lb Date of Birth:  [birth year]         BSA:          1.872 m Patient Age:    40 years          BP:  127/76 mmHg Patient Gender: F                 HR:           80 bpm. Exam Location:  [facility] Procedure: 2D Echo, Cardiac Doppler and Color Doppler Indications:     Bacteremia R78.81  History:         Patient has prior history of Echocardiogram examinations, most                  recent 08/05/2020. CAD; Risk Factors:Hypertension and Diabetes.  Sonographer:     Sherrie Sport Referring Phys:  [facility] [place] Diagnosing Phys: Nelva Bush MD  Sonographer Comments: Suboptimal apical window. IMPRESSIONS  1. Left ventricular ejection fraction, by estimation, is 35 to 40%. The left ventricle has moderately decreased function. The left ventricle demonstrates global hypokinesis. There is mild left ventricular hypertrophy. Left ventricular diastolic parameters are consistent with Grade II diastolic dysfunction (pseudonormalization). Elevated left atrial pressure.  2. Right ventricular systolic  function is mildly reduced. The right ventricular size is mildly enlarged. Tricuspid regurgitation signal is inadequate for assessing PA pressure.  3. Left atrial size was moderately dilated.  4. Right atrial size was moderately dilated.  5. The mitral valve is normal in structure. Moderate mitral valve regurgitation. No evidence of mitral stenosis.  6. The aortic valve has an indeterminant number of cusps. There is moderate calcification of the aortic valve. There is moderate thickening of the aortic valve. Aortic valve regurgitation is not visualized. Mild aortic valve stenosis. Aortic valve area,  by VTI measures 1.51 cm. Aortic valve mean gradient measures 9.0 mmHg. Conclusion(s)/Recommendation(s): There is no definite evidence of endocarditis, though native valve disease is present. Consider TEE for further evaluation, as clinically indicated. FINDINGS  Left Ventricle: Left ventricular ejection fraction, by estimation, is 35 to 40%. The left ventricle has moderately decreased function. The left ventricle demonstrates global hypokinesis. The left ventricular internal cavity size was normal in size. There is mild left ventricular hypertrophy. Left ventricular diastolic parameters are consistent with Grade II diastolic dysfunction (pseudonormalization). Elevated left atrial pressure. Right Ventricle: The right ventricular size is mildly enlarged. No increase in right ventricular wall thickness. Right ventricular systolic function is mildly reduced. Tricuspid regurgitation signal is inadequate for assessing PA pressure. Left Atrium: Left atrial size was moderately dilated. Right Atrium: Right atrial size was moderately dilated. Pericardium: The pericardium was not well visualized. Mitral Valve: The mitral valve is normal in structure. Moderate mitral valve regurgitation. No evidence of mitral valve stenosis. Tricuspid Valve: The tricuspid valve is not well visualized. Tricuspid valve regurgitation is mild. Aortic  Valve: The aortic valve has an indeterminant number of cusps. There is moderate calcification of the aortic valve. There is moderate thickening of the aortic valve. Aortic valve regurgitation is not visualized. Mild aortic stenosis is present. Aortic valve mean gradient measures 9.0 mmHg. Aortic valve peak gradient measures 14.3 mmHg. Aortic valve area, by VTI measures 1.51 cm. Pulmonic Valve: The pulmonic valve was not well visualized. Pulmonic valve regurgitation is not visualized. No evidence of pulmonic stenosis. Aorta: The aortic root is normal in size and structure. Pulmonary Artery: The pulmonary artery is of normal size. IAS/Shunts: The interatrial septum was not well visualized.  LEFT VENTRICLE PLAX 2D LVIDd:         4.88 cm  Diastology LVIDs:         3.58 cm  LV e' medial:    5.33 cm/s LV PW:  1.23 cm  LV E/e' medial:  21.6 LV IVS:        1.01 cm  LV e' lateral:   5.77 cm/s LVOT diam:     2.00 cm  LV E/e' lateral: 19.9 LV SV:         56 LV SV Index:   30 LVOT Area:     3.14 cm  RIGHT VENTRICLE RV Basal diam:  4.27 cm RV S prime:     9.03 cm/s TAPSE (M-mode): 1.4 cm LEFT ATRIUM              Index       RIGHT ATRIUM           Index LA diam:        5.80 cm  3.10 cm/m  RA Area:     26.30 cm LA Vol (A2C):   121.0 ml 64.64 ml/m RA Volume:   82.70 ml  44.18 ml/m LA Vol (A4C):   81.1 ml  43.32 ml/m LA Biplane Vol: 98.8 ml  52.78 ml/m  AORTIC VALVE                    PULMONIC VALVE AV Area (Vmax):    1.24 cm     PV Vmax:        0.80 m/s AV Area (Vmean):   1.37 cm     PV Peak grad:   2.6 mmHg AV Area (VTI):     1.51 cm     RVOT Peak grad: 3 mmHg AV Vmax:           189.00 cm/s AV Vmean:          135.000 cm/s AV VTI:            0.371 m AV Peak Grad:      14.3 mmHg AV Mean Grad:      9.0 mmHg LVOT Vmax:         74.40 cm/s LVOT Vmean:        59.000 cm/s LVOT VTI:          0.178 m LVOT/AV VTI ratio: 0.48  AORTA Ao Root diam: 2.60 cm MITRAL VALVE MV Area (PHT): 4.10 cm     SHUNTS MV Decel Time: 185 msec      Systemic VTI:  0.18 m MV E velocity: 115.00 cm/s  Systemic Diam: 2.00 cm MV A velocity: 92.10 cm/s MV E/A ratio:  1.25 Harrell Gave End MD Electronically signed by Nelva Bush MD Signature Date/Time: 05/24/2021/4:57:32 PM    Final      Assessment/Plan: Acute hypoxic secondary to missing dialysis and having pulmonary edema.  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  The graft is working well now after  she had angioplasty  with balloon on 05/23/21 2 d co did not show vegetations but calcification and thickening of aortic valve On cefepime until 06/08/21 ___End-stage renal disease on dialysis Coronary artery disease status post CABG CHF with low EF Disucssed the management with patient and care team

## 2021-05-25 NOTE — Discharge Instructions (Signed)
Resume your hemodialysis as before. Keep log of your blood sugars at home. I have held your diabetes medicine for now. Discussed with her primary care physician regarding resuming it depending on your sugars at home

## 2021-05-25 NOTE — Treatment Plan (Addendum)
Diagnosis: Sphingobacterium bacteremia Baseline Creatinine ESRD    Allergies  Allergen Reactions   Clonidine Other (See Comments) and Shortness Of Breath    Other reaction(s): Other (see comments) Other reaction(s): Other (See Comments)   Darvocet [Propoxyphene N-Acetaminophen] Anaphylaxis   Hydralazine     Other reaction(s): Other (See Comments) glomerulonephritis   Hydralazine Hcl Other (See Comments)    glomerulonephritis   Esomeprazole     Other reaction(s): Unknown   Guaifenesin     Other reaction(s): Unknown Other reaction(s): Unknown Other reaction(s): Unknown Other reaction(s): Unknown   Phenylephrine-Guaifenesin     Other reaction(s): Unknown   Rabeprazole     Other reaction(s): Unknown   Telithromycin     Other reaction(s): Unknown Other reaction(s): Unknown Other reaction(s): Unknown   Fluocinolone Other (See Comments)    Feels crazy Other reaction(s): Other (see comments) Other reaction(s): Other (See Comments) Feels crazy Feels crazy   Iodinated Diagnostic Agents Nausea And Vomiting and Other (See Comments)    Burning Other reaction(s): Other (see comments) Other reaction(s): Other (See Comments) Burning Nausea & vomiting   Levaquin [Levofloxacin] Nausea Only   Statins Other (See Comments)    Myalgia Other reaction(s): Other (See Comments), Unknown, Unknown Myalgia    OPAT Orders Cefepime 2 grams M, W, F during dialysis Duration: 2 weeks End Date: 06/08/21   Labs weekly while on IV antibiotics: _X_ CBC with differential  _X_ CMP  Blood culture on 06/15/21  Fax weekly labs to [phone]   Call [phone] with any questions

## 2021-05-25 NOTE — Progress Notes (Signed)
PHARMACY CONSULT NOTE FOR:  OUTPATIENT  PARENTERAL ANTIBIOTIC THERAPY (OPAT)  Indication: Sphingobacterium bacteremia Regimen: Cefazidime 2 grams M, W, F during dialysis End date: 06/08/21  IV antibiotic discharge orders are pended. To discharging provider:  please sign these orders via discharge navigator,  Select New Orders & click on the button choice - Manage This Unsigned Work.     Thank you for allowing pharmacy to be a part of this patient's care.  [name] O Abrar Koone 05/25/2021, 1:47 PM

## 2021-05-25 NOTE — Progress Notes (Signed)
PT Cancellation Note  Patient Details Name: EARNEST THALMAN MRN: [id number] DOB: [birth year]   Cancelled Treatment:    Reason Eval/Treat Not Completed: Other (comment). Pt currently out of room for HD at this time. Will re-attempt another date.   Kiyoshi Schaab 05/25/2021, 1:17 PM Greggory Stallion, PT, DPT [phone]

## 2021-05-25 NOTE — Progress Notes (Addendum)
[facility]  ROUNDING NOTE   Subjective:   [name] is a 87 y.o. white female with diabetes, CAD with CABG, chronic respiratory failure 2L, diastolic CHF, hypertension and ESRD on HD. Patient presents to ED with complaints of shortness of breath. She has been admitted for Acute pulmonary edema (HCC) [J81.0] Pulmonary edema [J81.1] Respiratory distress [R06.03] Hypoxia [R09.02] ESRD (end stage renal disease) on dialysis ([place]) [N18.6, Z99.2] Generalized weakness [Z66.0] Acute diastolic CHF (congestive heart failure) ([place]) [I50.31] Community acquired pneumonia, unspecified laterality [J18.9]  Left femoral temp HD catheter placed 05/21/21 by Dr. Bridgett Larsson, Vascular Surgery, but not used for dialysis  Patient seen resting in bed Husband at bedside Tolerating meals Denies shortness of breath  Patient seen later during dialysis   HEMODIALYSIS FLOWSHEET:  Blood Flow Rate (mL/min): 200 mL/min Arterial Pressure (mmHg): -90 mmHg Venous Pressure (mmHg): 70 mmHg Transmembrane Pressure (mmHg): 60 mmHg Ultrafiltration Rate (mL/min): 70 mL/min Dialysate Flow Rate (mL/min): 500 ml/min Conductivity: Machine : 13.9 Conductivity: Machine : 13.9  No complaints at this time Tolerating treatment via AVF  Objective:  Vital signs in last 24 hours:  Temp:  [97.4 F (36.3 C)-98.3 F (36.8 C)] 97.4 F (36.3 C) (08/24 0601) Pulse Rate:  [61-81] 71 (08/24 0741) Resp:  [16-20] 19 (08/24 0741) BP: (109-149)/(44-76) 149/67 (08/24 0741) SpO2:  [96 %-100 %] 100 % (08/24 0741) Weight:  [83.2 kg] 83.2 kg (08/24 0601)  Weight change: -0.318 kg Filed Weights   05/23/21 1345 05/24/21 0410 05/25/21 0601  Weight: 83.5 kg 81.8 kg 83.2 kg    Intake/Output: I/O last 3 completed shifts: In: 67 [P.O.:660; IV Piggyback:100] Out: 0    Intake/Output this shift:  Total I/O In: 240 [P.O.:240] Out: -   Physical Exam: General: NAD, laying in bed  Head: Normocephalic, atraumatic. Moist  oral mucosal membranes  Eyes: Anicteric  Lungs:  Clear, normal effort  Heart: Regular rate and rhythm  Abdomen:  Soft, nontender  Extremities:  + peripheral edema. BLE +erythema, +induration, tender to palpation  Neurologic: Nonfocal, moving all four extremities  Skin: No lesions  Access: Lt AVF with thrill and bruit,     Basic Metabolic Panel: Recent Labs  Lab 05/21/21 0345 05/22/21 0450 05/23/21 0522 05/25/21 0541  NA 135 134* 134* 132*  K 5.3* 4.5 4.6 4.1  CL 102 100 99 96*  CO2 26 25 28 28   GLUCOSE 156* 84 83 84  BUN 51* 32* 45* 50*  CREATININE 5.99* 4.07* 5.08* 4.96*  CALCIUM 7.8* 7.8* 7.8* 7.8*  PHOS  --   --  4.5  --      Liver Function Tests: Recent Labs  Lab 05/21/21 0345  AST 14*  ALT 8  ALKPHOS 102  BILITOT 0.7  PROT 7.9  ALBUMIN 3.0*    No results for input(s): LIPASE, AMYLASE in the last 168 hours. No results for input(s): AMMONIA in the last 168 hours.  CBC: Recent Labs  Lab 05/21/21 0345 05/21/21 0700 05/22/21 0450 05/23/21 0636 05/25/21 0541  WBC 13.1* 15.2* 9.8 7.4 5.4  NEUTROABS 12.1*  --   --   --   --   HGB 10.1* 9.3* 9.1* 9.2* 8.7*  HCT 33.7* 30.7* 32.3* 30.5* 28.4*  MCV 103.1* 103.0* 108.8* 100.0 99.3  PLT 210 180 131* 166 215     Cardiac Enzymes: No results for input(s): CKTOTAL, CKMB, CKMBINDEX, TROPONINI in the last 168 hours.  BNP: Invalid input(s): POCBNP  CBG: Recent Labs  Lab 05/24/21 0740 05/24/21  1114 05/24/21 1622 05/24/21 2216 05/25/21 0743  GLUCAP 126* 97 114* 100* 78     Microbiology: Results for orders placed or performed during the hospital encounter of 05/21/21  Resp Panel by RT-PCR (Flu A&B, Covid) Nasopharyngeal Swab     Status: None   Collection Time: 05/21/21  3:45 AM   Specimen: Nasopharyngeal Swab; Nasopharyngeal(NP) swabs in vial transport medium  Result Value Ref Range Status   SARS Coronavirus 2 by RT PCR NEGATIVE NEGATIVE Final    Comment: (NOTE) SARS-CoV-2 target nucleic acids are  NOT DETECTED.  The SARS-CoV-2 RNA is generally detectable in upper respiratory specimens during the acute phase of infection. The lowest concentration of SARS-CoV-2 viral copies this assay can detect is 138 copies/mL. A negative result does not preclude SARS-Cov-2 infection and should not be used as the sole basis for treatment or other patient management decisions. A negative result may occur with  improper specimen collection/handling, submission of specimen other than nasopharyngeal swab, presence of viral mutation(s) within the areas targeted by this assay, and inadequate number of viral copies(<138 copies/mL). A negative result must be combined with clinical observations, patient history, and epidemiological information. The expected result is Negative.  Fact Sheet for Patients:  EntrepreneurPulse.com.au  Fact Sheet for Healthcare Providers:  IncredibleEmployment.be  This test is no t yet approved or cleared by the [place] FDA and  has been authorized for detection and/or diagnosis of SARS-CoV-2 by FDA under an Emergency Use Authorization (EUA). This EUA will remain  in effect (meaning this test can be used) for the duration of the COVID-19 declaration under Section 564(b)(1) of the Act, 21 U.S.C.section 360bbb-3(b)(1), unless the authorization is terminated  or revoked sooner.       Influenza A by PCR NEGATIVE NEGATIVE Final   Influenza B by PCR NEGATIVE NEGATIVE Final    Comment: (NOTE) The Xpert Xpress SARS-CoV-2/FLU/RSV plus assay is intended as an aid in the diagnosis of influenza from Nasopharyngeal swab specimens and should not be used as a sole basis for treatment. Nasal washings and aspirates are unacceptable for Xpert Xpress SARS-CoV-2/FLU/RSV testing.  Fact Sheet for Patients: EntrepreneurPulse.com.au  Fact Sheet for Healthcare Providers: IncredibleEmployment.be  This test is not  yet approved or cleared by the [place] FDA and has been authorized for detection and/or diagnosis of SARS-CoV-2 by FDA under an Emergency Use Authorization (EUA). This EUA will remain in effect (meaning this test can be used) for the duration of the COVID-19 declaration under Section 564(b)(1) of the Act, 21 U.S.C. section 360bbb-3(b)(1), unless the authorization is terminated or revoked.  Performed at [facility], [address] [place], [place] [address]   Culture, blood (routine x 2)     Status: Abnormal   Collection Time: 05/21/21  3:45 AM   Specimen: BLOOD  Result Value Ref Range Status   Specimen Description   Final    BLOOD RIGHT ASSIST CONTROL Performed at [facility], [address], [place], [place] [address]    Special Requests   Final    BOTTLES DRAWN AEROBIC AND ANAEROBIC Blood Culture adequate volume Performed at [facility], [address] [place], [place] [address]    Culture  Setup Time   Final    Organism ID to follow GRAM NEGATIVE RODS AEROBIC BOTTLE ONLY IN BOTH AEROBIC AND ANAEROBIC BOTTLES CRITICAL RESULT CALLED TO, READ BACK BY AND VERIFIED WITH: SCHEMA HALLAJI AT 6767 ON 05/22/21 BY SS Performed at [facility], [address] [place], [place] [address]    Culture (A)  Final    [place] Standardized susceptibility testing for this organism is not available. Performed at [facility], [place] [address], [place], [place] [address]    Report Status 05/23/2021 FINAL  Final  Blood Culture ID Panel (Reflexed)     Status: None   Collection Time: 05/21/21  3:45 AM  Result Value Ref Range Status   Enterococcus faecalis NOT DETECTED NOT DETECTED Final   Enterococcus Faecium NOT DETECTED NOT DETECTED Final   Listeria monocytogenes NOT DETECTED NOT DETECTED Final   Staphylococcus species NOT DETECTED NOT DETECTED Final   Staphylococcus aureus (BCID) NOT DETECTED NOT DETECTED Final    Staphylococcus epidermidis NOT DETECTED NOT DETECTED Final   Staphylococcus lugdunensis NOT DETECTED NOT DETECTED Final   Streptococcus species NOT DETECTED NOT DETECTED Final   Streptococcus agalactiae NOT DETECTED NOT DETECTED Final   Streptococcus pneumoniae NOT DETECTED NOT DETECTED Final   Streptococcus pyogenes NOT DETECTED NOT DETECTED Final   A.calcoaceticus-baumannii NOT DETECTED NOT DETECTED Final   Bacteroides fragilis NOT DETECTED NOT DETECTED Final   Enterobacterales NOT DETECTED NOT DETECTED Final   Enterobacter cloacae complex NOT DETECTED NOT DETECTED Final   Escherichia coli NOT DETECTED NOT DETECTED Final   Klebsiella aerogenes NOT DETECTED NOT DETECTED Final   Klebsiella oxytoca NOT DETECTED NOT DETECTED Final   Klebsiella pneumoniae NOT DETECTED NOT DETECTED Final   Proteus species NOT DETECTED NOT DETECTED Final   Salmonella species NOT DETECTED NOT DETECTED Final   Serratia marcescens NOT DETECTED NOT DETECTED Final   Haemophilus influenzae NOT DETECTED NOT DETECTED Final   Neisseria meningitidis NOT DETECTED NOT DETECTED Final   Pseudomonas aeruginosa NOT DETECTED NOT DETECTED Final   Stenotrophomonas maltophilia NOT DETECTED NOT DETECTED Final   Candida albicans NOT DETECTED NOT DETECTED Final   Candida auris NOT DETECTED NOT DETECTED Final   Candida glabrata NOT DETECTED NOT DETECTED Final   Candida krusei NOT DETECTED NOT DETECTED Final   Candida parapsilosis NOT DETECTED NOT DETECTED Final   Candida tropicalis NOT DETECTED NOT DETECTED Final   Cryptococcus neoformans/gattii NOT DETECTED NOT DETECTED Final    Comment: Performed at [facility], [address] [place], [place] [address]  Culture, blood (routine x 2)     Status: Abnormal   Collection Time: 05/21/21  4:05 AM   Specimen: BLOOD  Result Value Ref Range Status   Specimen Description   Final    BLOOD RIGHT HAND Performed at [facility], [address] [place], [place]  [address]    Special Requests   Final    BOTTLES DRAWN AEROBIC ONLY Blood Culture results may not be optimal due to an inadequate volume of blood received in culture bottles Performed at [facility], [address] [place], [place] [address]    Culture  Setup Time AEROBIC BOTTLE ONLY GRAM NEGATIVE RODS   Final   Culture (A)  Final    SPHINGOBACTERIUM MULTIVORUM Standardized susceptibility testing for this organism is not available. Performed at [facility], [place] [address], [place], [place] [address]    Report Status 05/24/2021 FINAL  Final    Coagulation Studies: No results for input(s): LABPROT, INR in the last 72 hours.  Urinalysis: No results for input(s): COLORURINE, LABSPEC, PHURINE, GLUCOSEU, HGBUR, BILIRUBINUR, KETONESUR, PROTEINUR, UROBILINOGEN, NITRITE, LEUKOCYTESUR in the last 72 hours.  Invalid input(s): APPERANCEUR    Imaging: PERIPHERAL VASCULAR CATHETERIZATION  Result Date: 05/23/2021 See  surgical note for result.  ECHOCARDIOGRAM COMPLETE  Result Date: 05/24/2021    ECHOCARDIOGRAM REPORT   Patient Name:   [name] Date of Exam: 05/24/2021 Medical Rec #:  [id number]         Height:       64.0 in Accession #:    [id number]        Weight:       180.3 lb Date of Birth:  [birth year]         BSA:          1.872 m Patient Age:    23 years          BP:           127/76 mmHg Patient Gender: F                 HR:           80 bpm. Exam Location:  [facility] Procedure: 2D Echo, Cardiac Doppler and Color Doppler Indications:     Bacteremia R78.81  History:         Patient has prior history of Echocardiogram examinations, most                  recent 08/05/2020. CAD; Risk Factors:Hypertension and Diabetes.  Sonographer:     Sherrie Sport Referring Phys:  Ute Kingston Diagnosing Phys: Sarah Bush MD  Sonographer Comments: Suboptimal apical window. IMPRESSIONS  1. Left ventricular ejection fraction, by estimation, is 35 to 40%. The left ventricle has  moderately decreased function. The left ventricle demonstrates global hypokinesis. There is mild left ventricular hypertrophy. Left ventricular diastolic parameters are consistent with Grade II diastolic dysfunction (pseudonormalization). Elevated left atrial pressure.  2. Right ventricular systolic function is mildly reduced. The right ventricular size is mildly enlarged. Tricuspid regurgitation signal is inadequate for assessing PA pressure.  3. Left atrial size was moderately dilated.  4. Right atrial size was moderately dilated.  5. The mitral valve is normal in structure. Moderate mitral valve regurgitation. No evidence of mitral stenosis.  6. The aortic valve has an indeterminant number of cusps. There is moderate calcification of the aortic valve. There is moderate thickening of the aortic valve. Aortic valve regurgitation is not visualized. Mild aortic valve stenosis. Aortic valve area,  by VTI measures 1.51 cm. Aortic valve mean gradient measures 9.0 mmHg. Conclusion(s)/Recommendation(s): There is no definite evidence of endocarditis, though native valve disease is present. Consider TEE for further evaluation, as clinically indicated. FINDINGS  Left Ventricle: Left ventricular ejection fraction, by estimation, is 35 to 40%. The left ventricle has moderately decreased function. The left ventricle demonstrates global hypokinesis. The left ventricular internal cavity size was normal in size. There is mild left ventricular hypertrophy. Left ventricular diastolic parameters are consistent with Grade II diastolic dysfunction (pseudonormalization). Elevated left atrial pressure. Right Ventricle: The right ventricular size is mildly enlarged. No increase in right ventricular wall thickness. Right ventricular systolic function is mildly reduced. Tricuspid regurgitation signal is inadequate for assessing PA pressure. Left Atrium: Left atrial size was moderately dilated. Right Atrium: Right atrial size was moderately  dilated. Pericardium: The pericardium was not well visualized. Mitral Valve: The mitral valve is normal in structure. Moderate mitral valve regurgitation. No evidence of mitral valve stenosis. Tricuspid Valve: The tricuspid valve is not well visualized. Tricuspid valve regurgitation is mild. Aortic Valve: The aortic valve has an indeterminant number of cusps. There is moderate calcification of the aortic valve. There is moderate thickening  of the aortic valve. Aortic valve regurgitation is not visualized. Mild aortic stenosis is present. Aortic valve mean gradient measures 9.0 mmHg. Aortic valve peak gradient measures 14.3 mmHg. Aortic valve area, by VTI measures 1.51 cm. Pulmonic Valve: The pulmonic valve was not well visualized. Pulmonic valve regurgitation is not visualized. No evidence of pulmonic stenosis. Aorta: The aortic root is normal in size and structure. Pulmonary Artery: The pulmonary artery is of normal size. IAS/Shunts: The interatrial septum was not well visualized.  LEFT VENTRICLE PLAX 2D LVIDd:         4.88 cm  Diastology LVIDs:         3.58 cm  LV e' medial:    5.33 cm/s LV PW:         1.23 cm  LV E/e' medial:  21.6 LV IVS:        1.01 cm  LV e' lateral:   5.77 cm/s LVOT diam:     2.00 cm  LV E/e' lateral: 19.9 LV SV:         56 LV SV Index:   30 LVOT Area:     3.14 cm  RIGHT VENTRICLE RV Basal diam:  4.27 cm RV S prime:     9.03 cm/s TAPSE (M-mode): 1.4 cm LEFT ATRIUM              Index       RIGHT ATRIUM           Index LA diam:        5.80 cm  3.10 cm/m  RA Area:     26.30 cm LA Vol (A2C):   121.0 ml 64.64 ml/m RA Volume:   82.70 ml  44.18 ml/m LA Vol (A4C):   81.1 ml  43.32 ml/m LA Biplane Vol: 98.8 ml  52.78 ml/m  AORTIC VALVE                    PULMONIC VALVE AV Area (Vmax):    1.24 cm     PV Vmax:        0.80 m/s AV Area (Vmean):   1.37 cm     PV Peak grad:   2.6 mmHg AV Area (VTI):     1.51 cm     RVOT Peak grad: 3 mmHg AV Vmax:           189.00 cm/s AV Vmean:          135.000  cm/s AV VTI:            0.371 m AV Peak Grad:      14.3 mmHg AV Mean Grad:      9.0 mmHg LVOT Vmax:         74.40 cm/s LVOT Vmean:        59.000 cm/s LVOT VTI:          0.178 m LVOT/AV VTI ratio: 0.48  AORTA Ao Root diam: 2.60 cm MITRAL VALVE MV Area (PHT): 4.10 cm     SHUNTS MV Decel Time: 185 msec     Systemic VTI:  0.18 m MV E velocity: 115.00 cm/s  Systemic Diam: 2.00 cm MV A velocity: 92.10 cm/s MV E/A ratio:  1.25 Sarah Bush MD Electronically signed by Sarah Bush MD Signature Date/Time: 05/24/2021/4:57:32 PM    Final      Medications:    ceFEPime (MAXIPIME) IV 1 g (05/24/21 1840)     aspirin  81 mg Oral Q M,W,F   calcium carbonate  2 tablet Oral TID WC   calcium-vitamin D  1 tablet Oral Daily   Chlorhexidine Gluconate Cloth  6 each Topical Q0600   clopidogrel  75 mg Oral Daily   gabapentin  100 mg Oral BID   heparin  5,000 Units Subcutaneous Q8H   hydrALAZINE  25 mg Oral TID   insulin aspart  0-9 Units Subcutaneous TID WC   levothyroxine  150 mcg Oral Q0600   metoprolol tartrate  50 mg Oral BID   neomycin-bacitracin-polymyxin   Topical BID   vitamin B-12  1,000 mcg Oral Daily     Assessment/ Plan:  Ms. ZENIA GUEST is a 87 y.o.  female with diabetes, CAD with CABG, chronic respiratory failure 2L, diastolic CHF, hypertension and ESRD on HD. Patient presents to ED with complaints of shortness of breath. She has been admitted for Acute pulmonary edema (HCC) [J81.0] Pulmonary edema [J81.1] Respiratory distress [R06.03] Hypoxia [R09.02] ESRD (end stage renal disease) on dialysis ([place]) [N18.6, Z99.2] Generalized weakness [B63.8] Acute diastolic CHF (congestive heart failure) ([place]) [I50.31] Community acquired pneumonia, unspecified laterality [J18.9]  [place]. MWF LUE AVF 83kg   End stage renal disease on dialysis: hemodialysis treatment yesterday. Tolerated treatment well. Volume status has improved. Next dialysis treatment for tomorrow, continue MWF  schedule.  -Appreciate vascular performing fisulogram 05/24/21. Narrowing seen but access widely patent. Balloon applied to perianastomotic cephalic vein - Receiving dialysis today. UF goal 2L as tolerated - Next treatment scheduled for Wednesday Will require antibiotics at discharge with dialysis. Recommended schedule includes Ceftazidime 2g/2g/2g with dialysis with a completion date of 06/08/21.    2. Anemia of chronic kidney disease: macrocytic Lab Results  Component Value Date   HGB 8.7 (L) 05/25/2021    - Low dose EPO with MWF HD treatments  3. Secondary Hyperparathyroidism: outpatient labs from 05/09/2021: Phos 5.8, calcium 8.1, PTH 418.  - calcium carbonate with meals.   4. Hypertension : continue home regimen of torsemide, hydralazine and metoprolol.   5. Right lower extremity induration and ulcer: examination is concerning for cellulitis. Plan to follow up outpatient with vascular   LOS: 4 Baptiste Littler 8/24/202210:52 AM

## 2021-05-25 NOTE — Discharge Summary (Addendum)
[facility] NAME: [name]    MR#:  [id number]  DATE OF BIRTH:  1934-06-19  DATE OF ADMISSION:  05/21/2021 ADMITTING PHYSICIAN: Collier Bullock, MD  DATE OF DISCHARGE: 05/25/2021  PRIMARY CARE PHYSICIAN: Idelle Crouch, MD    ADMISSION DIAGNOSIS:  Acute pulmonary edema ([place]) [J81.0] Pulmonary edema [J81.1] Respiratory distress [R06.03] Hypoxia [R09.02] ESRD (end stage renal disease) on dialysis ([place]) [N18.6, Z99.2] Generalized weakness [J67.3] Acute diastolic CHF (congestive heart failure) ([place]) [I50.31] Community acquired pneumonia, unspecified laterality [J18.9]  DISCHARGE DIAGNOSIS:   Sphingobacterium bacteremia HD access declotting ESRD on HD SECONDARY DIAGNOSIS:   Past Medical History:  Diagnosis Date  . Arthritis   . Bilateral Macular degeneration   . Breast cancer ([place]) 2012   right breast  . Breast mass, right   . CAD S/P CABG x 4    a. 09/2012 CABG x 4: LIMA to LAD, SVG to Diag, SVG to OM1, SVG to PDA, EVH from bilateral thighs  . Cancer [facility])    breast cancer, right side 2012  . Complication of anesthesia   . Coronary artery disease 08/22/2012   sees Dr Rockey Situ  . Diabetes ([place])   . Diastolic dysfunction    a. 06/2020 Echo: EF 60-65%; b. 08/2020 Echo: EF 60-65%, no rwma, Gr1 DD. Nl RV size/fxn.  . DOE (dyspnea on exertion)   . ESRD (end stage renal disease) ([place])   . Gastritis    hx of  . GERD (gastroesophageal reflux disease)   . Headache(784.0)    migraines  . History of breast cancer    39 treatments of radiation. Negative chemo.  Mar[place] Kitchen History of seasonal allergies   . Hyperlipemia   . Hypertension    sees Dr. Fulton Reek  . Hypothyroidism   . Neuropathy   . Personal history of radiation therapy   . Pneumonia    hx of  . PONV (postoperative nausea and vomiting)   . Stroke ([place])    2012  . Vertigo     HOSPITAL COURSE:   KORTNEY POTVIN is a 87 y.o. white female with  diabetes, CAD with CABG, chronic respiratory failure 2L, diastolic CHF, hypertension and ESRD on HD. Patient presents to ED with complaints of shortness of breath. She has been admitted for Acute pulmonary edema.  # Acute hypoxic respiratory failure on chronic hypoxic respiratory failure 2/2 volume overload.  --On 2 L at home. Respiratory status much improved after dialysis yesterday, currently breathing comfortably on 2 L - [place] O2   # Fluid overload 2/2 missed dialysis from malfunctioning HD Access Resolved. Now back to mwf dialysis schedule -dialysis now resumed. - torsemide 100 (home med) continuing on non-dialysis days   # ESRD --Clotted left AFV.  ---Temporary dialysis catheter placed in left groin-- now removed -- status post percutaneous angioplasty with vascular surgery on 8/22. - cont mwf dialysis   # Bacteremia Unclear if one or 2 blood cultures growing sphingobacterium multivorum, a GNR. ID advises at least 2 wks abx which can be given at dialysis. - TTE unremarkable  --ID does not think TEE necessary if TTE unremarkable. - cont cefepime last dose 9/14   # HTN Here normotensive - home meds   # Venous stasis dermatitis Right lower extremity swelling greater than left with associated ulceration. Chronic problem, stable, followed outpt by vascular. Outpatient dopplers neg for DVT.     # CAD S/p cabg x4. No chest pain - home  aspirin, plavix, statin   # T2DM Here glucose wnl holding home glimepiride-- patient advised to continue sugar checks at home and discuss with primary care physician regarding resumption of diabetes meds if needed - daily fasting sugars   # Hypothyroidism - home synthroid   seen by physical therapy recommends home health.       DVT prophylaxis: heparin Code Status: dnr Family Communication: left message for husband   Level of care: Progressive Cardiac Status is: Inpatient     Dispo: The patient is from: Home              Anticipated  d/c is to: Home with [facility]              Patient currently is  medically stable to d/c.              Difficult to place patient No okay from infectious disease and nephrology standpoint for discharge. Patient is in agreement. CONSULTS OBTAINED:    DRUG ALLERGIES:   Allergies  Allergen Reactions  . Clonidine Other (See Comments) and Shortness Of Breath    Other reaction(s): Other (see comments) Other reaction(s): Other (See Comments)  . Darvocet [Propoxyphene N-Acetaminophen] Anaphylaxis  . Hydralazine     Other reaction(s): Other (See Comments) glomerulonephritis  . Hydralazine Hcl Other (See Comments)    glomerulonephritis  . Esomeprazole     Other reaction(s): Unknown  . Guaifenesin     Other reaction(s): Unknown Other reaction(s): Unknown Other reaction(s): Unknown Other reaction(s): Unknown  . Phenylephrine-Guaifenesin     Other reaction(s): Unknown  . Rabeprazole     Other reaction(s): Unknown  . Telithromycin     Other reaction(s): Unknown Other reaction(s): Unknown Other reaction(s): Unknown  . Fluocinolone Other (See Comments)    Feels crazy Other reaction(s): Other (see comments) Other reaction(s): Other (See Comments) Feels crazy Feels crazy  . Iodinated Diagnostic Agents Nausea And Vomiting and Other (See Comments)    Burning Other reaction(s): Other (see comments) Other reaction(s): Other (See Comments) Burning Nausea & vomiting  . Levaquin [Levofloxacin] Nausea Only  . Statins Other (See Comments)    Myalgia Other reaction(s): Other (See Comments), Unknown, Unknown Myalgia    DISCHARGE MEDICATIONS:   Allergies as of 05/25/2021       Reactions   Clonidine Other (See Comments), Shortness Of Breath   Other reaction(s): Other (see comments) Other reaction(s): Other (See Comments)   Darvocet [propoxyphene N-acetaminophen] Anaphylaxis   Hydralazine    Other reaction(s): Other (See Comments) glomerulonephritis   Hydralazine Hcl Other (See Comments)    glomerulonephritis   Esomeprazole    Other reaction(s): Unknown   Guaifenesin    Other reaction(s): Unknown Other reaction(s): Unknown Other reaction(s): Unknown Other reaction(s): Unknown   Phenylephrine-guaifenesin    Other reaction(s): Unknown   Rabeprazole    Other reaction(s): Unknown   Telithromycin    Other reaction(s): Unknown Other reaction(s): Unknown Other reaction(s): Unknown   Fluocinolone Other (See Comments)   Feels crazy Other reaction(s): Other (see comments) Other reaction(s): Other (See Comments) Feels crazy Feels crazy   Iodinated Diagnostic Agents Nausea And Vomiting, Other (See Comments)   Burning Other reaction(s): Other (see comments) Other reaction(s): Other (See Comments) Burning Nausea & vomiting   Levaquin [levofloxacin] Nausea Only   Statins Other (See Comments)   Myalgia Other reaction(s): Other (See Comments), Unknown, Unknown Myalgia        Medication List     STOP taking these medications    furosemide 20  MG tablet Commonly known as: LASIX   glimepiride 4 MG tablet Commonly known as: AMARYL   sulfamethoxazole-trimethoprim 800-160 MG tablet Commonly known as: BACTRIM DS       TAKE these medications    ALPRAZolam 0.5 MG tablet Commonly known as: XANAX Take 0.5 mg by mouth 2 (two) times daily as needed for anxiety.   aspirin 81 MG chewable tablet Chew 81 mg by mouth every Monday, Wednesday, and Friday.   calcium-vitamin D 500-200 MG-UNIT tablet Commonly known as: OSCAL WITH D Take 1 tablet by mouth daily.   ceFEPime  IVPB Commonly known as: MAXIPIME Inject 2 g into the vein every Monday, Wednesday, and Friday with hemodialysis for 14 days. Labs weekly while on IV antibiotics: _X_ CBC with differential   _X_ CMP   Blood culture on 06/15/21   Fax weekly labs to [phone]     Call [phone] with any questions   clopidogrel 75 MG tablet Commonly known as: PLAVIX Take 75 mg by mouth daily.    Cyanocobalamin 1000 MCG Subl Take 1,000 mcg by mouth daily.   gabapentin 100 MG capsule Commonly known as: NEURONTIN Take 1 capsule by mouth 2 (two) times daily. What changed: Another medication with the same name was removed. Continue taking this medication, and follow the directions you see here.   hydrALAZINE 25 MG tablet Commonly known as: APRESOLINE Take 25 mg by mouth 3 (three) times daily.   HYDROcodone-acetaminophen 5-325 MG tablet Commonly known as: NORCO/VICODIN Take 1 tablet by mouth every 6 (six) hours as needed for moderate pain.   levothyroxine 150 MCG tablet Commonly known as: SYNTHROID Take by mouth. What changed: Another medication with the same name was removed. Continue taking this medication, and follow the directions you see here.   metoprolol tartrate 50 MG tablet Commonly known as: LOPRESSOR Take 50 mg by mouth in the morning, at noon, and at bedtime.   neomycin-bacitracin-polymyxin ointment Commonly known as: NEOSPORIN Apply topically 2 (two) times daily.   OXYGEN Inhale 2 L into the lungs daily.   torsemide 100 MG tablet Commonly known as: DEMADEX Take 1 tablet (100 mg total) by mouth 3 (three) times a week. On Non dialysis days What changed:  when to take this additional instructions               Discharge Care Instructions  (From admission, onward)           Start     Ordered   05/25/21 0000  Change dressing on IV access line weekly and PRN  (Home infusion instructions - [facility] )        05/25/21 1410   05/25/21 0000  Change dressing on IV access line weekly and PRN  (Home infusion instructions - [facility] )        05/25/21 1505            If you experience worsening of your admission symptoms, develop shortness of breath, life threatening emergency, suicidal or homicidal thoughts you must seek medical attention immediately by calling 911 or calling your MD immediately  if symptoms less  severe.  You Must read complete instructions/literature along with all the possible adverse reactions/side effects for all the Medicines you take and that have been prescribed to you. Take any new Medicines after you have completely understood and accept all the possible adverse reactions/side effects.   Please note  You were cared for by a hospitalist during your hospital stay. If  you have any questions about your discharge medications or the care you received while you were in the hospital after you are discharged, you can call the unit and asked to speak with the hospitalist on call if the hospitalist that took care of you is not available. Once you are discharged, your primary care physician will handle any further medical issues. Please note that NO REFILLS for any discharge medications will be authorized once you are discharged, as it is imperative that you return to your primary care physician (or establish a relationship with a primary care physician if you do not have one) for your aftercare needs so that they can reassess your need for medications and monitor your lab values. Today   SUBJECTIVE  Seen at HD No new complaints Sats stable on 2 L (chronic)   VITAL SIGNS:  Blood pressure (!) 161/151, pulse 86, temperature 97.7 F (36.5 C), temperature source Oral, resp. rate 19, height 5\' 4"  (1.626 m), weight 83.2 kg, SpO2 99 %.  I/O:   Intake/Output Summary (Last 24 hours) at 05/25/2021 1506 Last data filed at 05/25/2021 1018 Gross per 24 hour  Intake 520 ml  Output 0 ml  Net 520 ml    PHYSICAL EXAMINATION:  GENERAL:  87 y.o.-year-old patient lying in the bed with no acute distress.   LUNGS: Normal breath sounds bilaterally, no wheezing, rales,rhonchi or crepitation. No use of accessory muscles of respiration.  CARDIOVASCULAR: S1, S2 normal. No murmurs, rubs, or gallops.  ABDOMEN: Soft, non-tender, non-distended. Bowel sounds present. No organomegaly or mass.  EXTREMITIES:  chronic venous stasis NEUROLOGIC: non-focal PSYCHIATRIC: patient is alert and oriented x 3.  SKIN:  Pressure Injury 08/04/20 Sacrum Medial Stage 2 -  Partial thickness loss of dermis presenting as a shallow open injury with a red, pink wound bed without slough. (Active)  08/04/20 2300  Location: Sacrum  Location Orientation: Medial  Staging: Stage 2 -  Partial thickness loss of dermis presenting as a shallow open injury with a red, pink wound bed without slough.  Wound Description (Comments):   Present on Admission: Yes     Pressure Injury 05/24/21 Foot Anterior;Left Unstageable - Full thickness tissue loss in which the base of the injury is covered by slough (yellow, tan, gray, green or brown) and/or eschar (tan, brown or black) in the wound bed. (Active)  05/24/21 1800  Location: Foot  Location Orientation: Anterior;Left  Staging: Unstageable - Full thickness tissue loss in which the base of the injury is covered by slough (yellow, tan, gray, green or brown) and/or eschar (tan, brown or black) in the wound bed.  Wound Description (Comments):   Present on Admission: Yes        DATA REVIEW:   CBC  Recent Labs  Lab 05/25/21 0541  WBC 5.4  HGB 8.7*  HCT 28.4*  PLT 215    Chemistries  Recent Labs  Lab 05/21/21 0345 05/22/21 0450 05/25/21 0541  NA 135   < > 132*  K 5.3*   < > 4.1  CL 102   < > 96*  CO2 26   < > 28  GLUCOSE 156*   < > 84  BUN 51*   < > 50*  CREATININE 5.99*   < > 4.96*  CALCIUM 7.8*   < > 7.8*  AST 14*  --   --   ALT 8  --   --   ALKPHOS 102  --   --   BILITOT 0.7  --   --    < > =  values in this interval not displayed.    Microbiology Results   Recent Results (from the past 240 hour(s))  Resp Panel by RT-PCR (Flu A&B, Covid) Nasopharyngeal Swab     Status: None   Collection Time: 05/21/21  3:45 AM   Specimen: Nasopharyngeal Swab; Nasopharyngeal(NP) swabs in vial transport medium  Result Value Ref Range Status   SARS Coronavirus 2 by RT PCR  NEGATIVE NEGATIVE Final    Comment: (NOTE) SARS-CoV-2 target nucleic acids are NOT DETECTED.  The SARS-CoV-2 RNA is generally detectable in upper respiratory specimens during the acute phase of infection. The lowest concentration of SARS-CoV-2 viral copies this assay can detect is 138 copies/mL. A negative result does not preclude SARS-Cov-2 infection and should not be used as the sole basis for treatment or other patient management decisions. A negative result may occur with  improper specimen collection/handling, submission of specimen other than nasopharyngeal swab, presence of viral mutation(s) within the areas targeted by this assay, and inadequate number of viral copies(<138 copies/mL). A negative result must be combined with clinical observations, patient history, and epidemiological information. The expected result is Negative.  Fact Sheet for Patients:  EntrepreneurPulse.com.au  Fact Sheet for Healthcare Providers:  IncredibleEmployment.be  This test is no t yet approved or cleared by the [place] FDA and  has been authorized for detection and/or diagnosis of SARS-CoV-2 by FDA under an Emergency Use Authorization (EUA). This EUA will remain  in effect (meaning this test can be used) for the duration of the COVID-19 declaration under Section 564(b)(1) of the Act, 21 U.S.C.section 360bbb-3(b)(1), unless the authorization is terminated  or revoked sooner.       Influenza A by PCR NEGATIVE NEGATIVE Final   Influenza B by PCR NEGATIVE NEGATIVE Final    Comment: (NOTE) The Xpert Xpress SARS-CoV-2/FLU/RSV plus assay is intended as an aid in the diagnosis of influenza from Nasopharyngeal swab specimens and should not be used as a sole basis for treatment. Nasal washings and aspirates are unacceptable for Xpert Xpress SARS-CoV-2/FLU/RSV testing.  Fact Sheet for Patients: EntrepreneurPulse.com.au  Fact Sheet for  Healthcare Providers: IncredibleEmployment.be  This test is not yet approved or cleared by the [place] FDA and has been authorized for detection and/or diagnosis of SARS-CoV-2 by FDA under an Emergency Use Authorization (EUA). This EUA will remain in effect (meaning this test can be used) for the duration of the COVID-19 declaration under Section 564(b)(1) of the Act, 21 U.S.C. section 360bbb-3(b)(1), unless the authorization is terminated or revoked.  Performed at [facility] [address] [place], [place] [address]   Culture, blood (routine x 2)     Status: Abnormal   Collection Time: 05/21/21  3:45 AM   Specimen: BLOOD  Result Value Ref Range Status   Specimen Description   Final    BLOOD RIGHT ASSIST CONTROL Performed at [facility], [address] [place], [place] [address]    Special Requests   Final    BOTTLES DRAWN AEROBIC AND ANAEROBIC Blood Culture adequate volume Performed at [facility], [address] [place], [place] [address]    Culture  Setup Time   Final    Organism ID to follow GRAM NEGATIVE RODS AEROBIC BOTTLE ONLY IN BOTH AEROBIC AND ANAEROBIC BOTTLES CRITICAL RESULT CALLED TO, READ BACK BY AND VERIFIED WITH: SCHEMA HALLAJI AT 0923 ON 05/22/21 BY SS Performed at [facility], [address], [place], [place] [address]    Culture (A)  Final  [place] Standardized susceptibility testing for this organism is not available. Performed at [facility], [place] [address] [place], [place] [address]    Report Status 05/25/2021 FINAL  Final  Blood Culture ID Panel (Reflexed)     Status: None   Collection Time: 05/21/21  3:45 AM  Result Value Ref Range Status   Enterococcus faecalis NOT DETECTED NOT DETECTED Final   Enterococcus Faecium NOT DETECTED NOT DETECTED Final   Listeria monocytogenes NOT DETECTED NOT DETECTED Final   Staphylococcus species NOT DETECTED NOT  DETECTED Final   Staphylococcus aureus (BCID) NOT DETECTED NOT DETECTED Final   Staphylococcus epidermidis NOT DETECTED NOT DETECTED Final   Staphylococcus lugdunensis NOT DETECTED NOT DETECTED Final   Streptococcus species NOT DETECTED NOT DETECTED Final   Streptococcus agalactiae NOT DETECTED NOT DETECTED Final   Streptococcus pneumoniae NOT DETECTED NOT DETECTED Final   Streptococcus pyogenes NOT DETECTED NOT DETECTED Final   A.calcoaceticus-baumannii NOT DETECTED NOT DETECTED Final   Bacteroides fragilis NOT DETECTED NOT DETECTED Final   Enterobacterales NOT DETECTED NOT DETECTED Final   Enterobacter cloacae complex NOT DETECTED NOT DETECTED Final   Escherichia coli NOT DETECTED NOT DETECTED Final   Klebsiella aerogenes NOT DETECTED NOT DETECTED Final   Klebsiella oxytoca NOT DETECTED NOT DETECTED Final   Klebsiella pneumoniae NOT DETECTED NOT DETECTED Final   Proteus species NOT DETECTED NOT DETECTED Final   Salmonella species NOT DETECTED NOT DETECTED Final   Serratia marcescens NOT DETECTED NOT DETECTED Final   Haemophilus influenzae NOT DETECTED NOT DETECTED Final   Neisseria meningitidis NOT DETECTED NOT DETECTED Final   Pseudomonas aeruginosa NOT DETECTED NOT DETECTED Final   Stenotrophomonas maltophilia NOT DETECTED NOT DETECTED Final   Candida albicans NOT DETECTED NOT DETECTED Final   Candida auris NOT DETECTED NOT DETECTED Final   Candida glabrata NOT DETECTED NOT DETECTED Final   Candida krusei NOT DETECTED NOT DETECTED Final   Candida parapsilosis NOT DETECTED NOT DETECTED Final   Candida tropicalis NOT DETECTED NOT DETECTED Final   Cryptococcus neoformans/gattii NOT DETECTED NOT DETECTED Final    Comment: Performed at [facility], [address] [place], [place] [address]  Culture, blood (routine x 2)     Status: Abnormal (Preliminary result)   Collection Time: 05/21/21  4:05 AM   Specimen: BLOOD  Result Value Ref Range Status   Specimen Description    Final    BLOOD RIGHT HAND Performed at [facility], [address] [place], [place] [address]    Special Requests   Final    BOTTLES DRAWN AEROBIC ONLY Blood Culture results may not be optimal due to an inadequate volume of blood received in culture bottles Performed at [facility], [address] [place], [place] [address]    Culture  Setup Time AEROBIC BOTTLE ONLY GRAM NEGATIVE RODS   Final   Culture (A)  Final    SPHINGOBACTERIUM MULTIVORUM Standardized susceptibility testing for this organism is not available. CULTURE REINCUBATED FOR BETTER GROWTH Performed at [facility], [place] [address], [place], [place] [address]    Report Status PENDING  Incomplete  Cath Tip Culture     Status: None (Preliminary result)   Collection Time: 05/24/21  1:27 PM   Specimen: Catheter Tip; Other  Result Value Ref Range Status   Specimen Description   Final    CATH TIP Performed at [facility], [address], [place], [place] [address]    Special Requests  Final    NONE Performed at [facility], [address], [place], [place] [address]    Culture   Final    NO GROWTH < 24 HOURS Performed at [facility], [place] [address], [place],  [address]    Report Status PENDING  Incomplete    RADIOLOGY:  ECHOCARDIOGRAM COMPLETE  Result Date: 05/24/2021    ECHOCARDIOGRAM REPORT   Patient Name:   JANARA KLETT Date of Exam: 05/24/2021 Medical Rec #:  [id number]         Height:       64.0 in Accession #:    [id number]        Weight:       180.3 lb Date of Birth:  [birth year]         BSA:          1.872 m Patient Age:    42 years          BP:           127/76 mmHg Patient Gender: F                 HR:           80 bpm. Exam Location:  [facility] Procedure: 2D Echo, Cardiac Doppler and Color Doppler Indications:     Bacteremia R78.81  History:         Patient has prior history of Echocardiogram examinations, most                  recent 08/05/2020.  CAD; Risk Factors:Hypertension and Diabetes.  Sonographer:     Sherrie Sport Referring Phys:  [place] Diagnosing Phys: Nelva Bush MD  Sonographer Comments: Suboptimal apical window. IMPRESSIONS  1. Left ventricular ejection fraction, by estimation, is 35 to 40%. The left ventricle has moderately decreased function. The left ventricle demonstrates global hypokinesis. There is mild left ventricular hypertrophy. Left ventricular diastolic parameters are consistent with Grade II diastolic dysfunction (pseudonormalization). Elevated left atrial pressure.  2. Right ventricular systolic function is mildly reduced. The right ventricular size is mildly enlarged. Tricuspid regurgitation signal is inadequate for assessing PA pressure.  3. Left atrial size was moderately dilated.  4. Right atrial size was moderately dilated.  5. The mitral valve is normal in structure. Moderate mitral valve regurgitation. No evidence of mitral stenosis.  6. The aortic valve has an indeterminant number of cusps. There is moderate calcification of the aortic valve. There is moderate thickening of the aortic valve. Aortic valve regurgitation is not visualized. Mild aortic valve stenosis. Aortic valve area,  by VTI measures 1.51 cm. Aortic valve mean gradient measures 9.0 mmHg. Conclusion(s)/Recommendation(s): There is no definite evidence of endocarditis, though native valve disease is present. Consider TEE for further evaluation, as clinically indicated. FINDINGS  Left Ventricle: Left ventricular ejection fraction, by estimation, is 35 to 40%. The left ventricle has moderately decreased function. The left ventricle demonstrates global hypokinesis. The left ventricular internal cavity size was normal in size. There is mild left ventricular hypertrophy. Left ventricular diastolic parameters are consistent with Grade II diastolic dysfunction (pseudonormalization). Elevated left atrial pressure. Right Ventricle: The right  ventricular size is mildly enlarged. No increase in right ventricular wall thickness. Right ventricular systolic function is mildly reduced. Tricuspid regurgitation signal is inadequate for assessing PA pressure. Left Atrium: Left atrial size was moderately dilated. Right Atrium: Right atrial size was moderately dilated. Pericardium: The pericardium was not well visualized. Mitral Valve: The  mitral valve is normal in structure. Moderate mitral valve regurgitation. No evidence of mitral valve stenosis. Tricuspid Valve: The tricuspid valve is not well visualized. Tricuspid valve regurgitation is mild. Aortic Valve: The aortic valve has an indeterminant number of cusps. There is moderate calcification of the aortic valve. There is moderate thickening of the aortic valve. Aortic valve regurgitation is not visualized. Mild aortic stenosis is present. Aortic valve mean gradient measures 9.0 mmHg. Aortic valve peak gradient measures 14.3 mmHg. Aortic valve area, by VTI measures 1.51 cm. Pulmonic Valve: The pulmonic valve was not well visualized. Pulmonic valve regurgitation is not visualized. No evidence of pulmonic stenosis. Aorta: The aortic root is normal in size and structure. Pulmonary Artery: The pulmonary artery is of normal size. IAS/Shunts: The interatrial septum was not well visualized.  LEFT VENTRICLE PLAX 2D LVIDd:         4.88 cm  Diastology LVIDs:         3.58 cm  LV e' medial:    5.33 cm/s LV PW:         1.23 cm  LV E/e' medial:  21.6 LV IVS:        1.01 cm  LV e' lateral:   5.77 cm/s LVOT diam:     2.00 cm  LV E/e' lateral: 19.9 LV SV:         56 LV SV Index:   30 LVOT Area:     3.14 cm  RIGHT VENTRICLE RV Basal diam:  4.27 cm RV S prime:     9.03 cm/s TAPSE (M-mode): 1.4 cm LEFT ATRIUM              Index       RIGHT ATRIUM           Index LA diam:        5.80 cm  3.10 cm/m  RA Area:     26.30 cm LA Vol (A2C):   121.0 ml 64.64 ml/m RA Volume:   82.70 ml  44.18 ml/m LA Vol (A4C):   81.1 ml  43.32 ml/m  LA Biplane Vol: 98.8 ml  52.78 ml/m  AORTIC VALVE                    PULMONIC VALVE AV Area (Vmax):    1.24 cm     PV Vmax:        0.80 m/s AV Area (Vmean):   1.37 cm     PV Peak grad:   2.6 mmHg AV Area (VTI):     1.51 cm     RVOT Peak grad: 3 mmHg AV Vmax:           189.00 cm/s AV Vmean:          135.000 cm/s AV VTI:            0.371 m AV Peak Grad:      14.3 mmHg AV Mean Grad:      9.0 mmHg LVOT Vmax:         74.40 cm/s LVOT Vmean:        59.000 cm/s LVOT VTI:          0.178 m LVOT/AV VTI ratio: 0.48  AORTA Ao Root diam: 2.60 cm MITRAL VALVE MV Area (PHT): 4.10 cm     SHUNTS MV Decel Time: 185 msec     Systemic VTI:  0.18 m MV E velocity: 115.00 cm/s  Systemic Diam: 2.00 cm MV A velocity: 92.10 cm/s  MV E/A ratio:  1.25 Nelva Bush MD Electronically signed by Nelva Bush MD Signature Date/Time: 05/24/2021/4:57:32 PM    Final      CODE STATUS:     Code Status Orders  (From admission, onward)           Start     Ordered   05/21/21 0945  Do not attempt resuscitation (DNR)  Continuous       Question Answer Comment  In the event of cardiac or respiratory ARREST Do not call a "code blue"   In the event of cardiac or respiratory ARREST Do not perform Intubation, CPR, defibrillation or ACLS   In the event of cardiac or respiratory ARREST Use medication by any route, position, wound care, and other measures to relive pain and suffering. May use oxygen, suction and manual treatment of airway obstruction as needed for comfort.   Comments Code status discussed with patient and husband and she in DNR      05/21/21 0944           Code Status History     Date Active Date Inactive Code Status Order ID Comments User Context   05/21/2021 0939 05/21/2021 0944 DNR [id number]  Collier Bullock, MD ED   05/21/2021 0542 05/21/2021 0938 Full Code [id number]  Athena Masse, MD ED   08/04/2020 0535 08/11/2020 1917 Full Code [id number]  Athena Masse, MD ED   06/22/2020 1908 07/02/2020 1918 Full Code  [id number]  Gwynne Edinger, MD ED   05/04/2016 1336 05/09/2016 1740 Full Code [id number]  Hower, Aaron Mose, MD Inpatient   04/28/2016 0157 05/02/2016 1656 Full Code [id number]  Harrie Foreman, MD ED   07/17/2015 1111 07/20/2015 1432 Full Code [id number]  Bettey Costa, MD Inpatient   09/10/2012 1956 09/23/2012 1532 Full Code [id number]  Lorraine Lax, RN Inpatient   09/09/2012 0817 09/10/2012 1956 Full Code [id number]  Rexene Alberts, MD Inpatient   09/05/2012 1324 09/09/2012 0817 Full Code [id number]  Edwyna Perfect, RN Inpatient        TOTAL TIME TAKING CARE OF THIS PATIENT: 40 minutes.    Fritzi Mandes M.D  [facility]    CC: Primary care physician; Idelle Crouch, MD

## 2021-05-25 NOTE — Progress Notes (Signed)
PHARMACY CONSULT NOTE FOR:  OUTPATIENT  PARENTERAL ANTIBIOTIC THERAPY (OPAT)  Indication: Sphingobacterium bacteremia Regimen: Cefepime 2 grams M, W, F during dialysis End date: 06/08/21  IV antibiotic discharge orders are pended. To discharging provider:  please sign these orders via discharge navigator,  Select New Orders & click on the button choice - Manage This Unsigned Work.     Thank you for allowing pharmacy to be a part of this patient's care.  Jimmy Footman, PharmD, BCPS, BCIDP Infectious Diseases Clinical Pharmacist Phone: [phone] 05/25/2021, 3:03 PM

## 2021-05-26 LAB — CATH TIP CULTURE: Culture: 2000 — AB

## 2021-05-29 LAB — SUSCEPTIBILITY, AER + ANAEROB: Source of Sample: 8680

## 2021-05-29 LAB — SUSCEPTIBILITY RESULT

## 2021-05-31 ENCOUNTER — Ambulatory Visit (INDEPENDENT_AMBULATORY_CARE_PROVIDER_SITE_OTHER): Payer: Medicare Other | Admitting: Vascular Surgery

## 2021-05-31 ENCOUNTER — Other Ambulatory Visit: Payer: Self-pay

## 2021-05-31 ENCOUNTER — Encounter (INDEPENDENT_AMBULATORY_CARE_PROVIDER_SITE_OTHER): Payer: Medicare Other

## 2021-05-31 VITALS — BP 122/73 | HR 76 | Ht 64.0 in | Wt 180.0 lb

## 2021-05-31 DIAGNOSIS — N186 End stage renal disease: Secondary | ICD-10-CM

## 2021-05-31 DIAGNOSIS — M7989 Other specified soft tissue disorders: Secondary | ICD-10-CM | POA: Diagnosis not present

## 2021-05-31 DIAGNOSIS — I10 Essential (primary) hypertension: Secondary | ICD-10-CM

## 2021-05-31 DIAGNOSIS — L97311 Non-pressure chronic ulcer of right ankle limited to breakdown of skin: Secondary | ICD-10-CM

## 2021-05-31 DIAGNOSIS — Z992 Dependence on renal dialysis: Secondary | ICD-10-CM

## 2021-05-31 DIAGNOSIS — E782 Mixed hyperlipidemia: Secondary | ICD-10-CM

## 2021-05-31 LAB — CULTURE, BLOOD (ROUTINE X 2)

## 2021-05-31 NOTE — Assessment & Plan Note (Signed)
The patient has a new ulceration in the right lower leg just above the ankle from trying to put on compression socks and also has weeping from the left ankle area.  3 layer Unna boots were placed today and will be changed weekly.

## 2021-05-31 NOTE — Progress Notes (Signed)
MRN : [id number]  [name] is a 87 y.o. (25-Jul-1934) female who presents with chief complaint of  Chief Complaint  Patient presents with   Follow-up    Swollen feet patient has been elevating but not wearing compression socks. Pt would like to see JD  .  History of Present Illness: Patient returns today in follow up prior to her scheduled visit due to worsening leg swelling now with ulceration worse on the right leg but also the left leg.  This is painful.  Her swelling has gotten very prominent.  On a good note, her access is working well for dialysis and they have been able to stick it every time that long.  No fevers or chills.  There is weeping and drainage in both legs  Current Outpatient Medications  Medication Sig Dispense Refill   ALPRAZolam (XANAX) 0.5 MG tablet Take 0.5 mg by mouth 2 (two) times daily as needed for anxiety.      ALPRAZolam (XANAX) 0.5 MG tablet Take 1 tablet by mouth 2 (two) times daily as needed.     aspirin 81 MG chewable tablet Chew 81 mg by mouth every Monday, Wednesday, and Friday.      calcium-vitamin D (OSCAL WITH D) 500-200 MG-UNIT tablet Take 1 tablet by mouth daily.     ceFEPime (MAXIPIME) IVPB Inject 2 g into the vein every Monday, Wednesday, and Friday with hemodialysis for 14 days. Labs weekly while on IV antibiotics: _X_ CBC with differential   _X_ CMP   Blood culture on 06/15/21   Fax weekly labs to [phone]     Call [phone] with any questions 6 Units 0   clopidogrel (PLAVIX) 75 MG tablet Take 75 mg by mouth daily.     Cyanocobalamin 1000 MCG SUBL Take 1,000 mcg by mouth daily.      gabapentin (NEURONTIN) 100 MG capsule Take 1 capsule by mouth 2 (two) times daily.     hydrALAZINE (APRESOLINE) 25 MG tablet Take 25 mg by mouth 3 (three) times daily.     HYDROcodone-acetaminophen (NORCO/VICODIN) 5-325 MG tablet Take 1 tablet by mouth every 6 (six) hours as needed for moderate pain. 30 tablet 0   levothyroxine (SYNTHROID)  150 MCG tablet Take by mouth.     metoprolol tartrate (LOPRESSOR) 50 MG tablet Take 50 mg by mouth in the morning, at noon, and at bedtime.     neomycin-bacitracin-polymyxin (NEOSPORIN) ointment Apply topically 2 (two) times daily. 15 g 0   OXYGEN Inhale 2 L into the lungs daily.     torsemide (DEMADEX) 100 MG tablet Take 1 tablet (100 mg total) by mouth 3 (three) times a week. On Non dialysis days 30 tablet 0   No current facility-administered medications for this visit.    Past Medical History:  Diagnosis Date   Arthritis    Bilateral Macular degeneration    Breast cancer ([place]) 2012   right breast   Breast mass, right    CAD S/P CABG x 4    a. 09/2012 CABG x 4: LIMA to LAD, SVG to Diag, SVG to OM1, SVG to PDA, EVH from bilateral thighs   Cancer ([place])    breast cancer, right side 3846   Complication of anesthesia    Coronary artery disease 08/22/2012   sees Dr Rockey Situ   Diabetes [facility])    Diastolic dysfunction    a. 06/2020 Echo: EF 60-65%; b. 08/2020 Echo: EF 60-65%, no rwma, Gr1 DD. Nl RV size/fxn.  DOE (dyspnea on exertion)    ESRD (end stage renal disease) ([place])    Gastritis    hx of   GERD (gastroesophageal reflux disease)    Headache(784.0)    migraines   History of breast cancer    39 treatments of radiation. Negative chemo.   History of seasonal allergies    Hyperlipemia    Hypertension    sees Dr. Fulton Reek   Hypothyroidism    Neuropathy    Personal history of radiation therapy    Pneumonia    hx of   PONV (postoperative nausea and vomiting)    Stroke [facility])    2012   Vertigo     Past Surgical History:  Procedure Laterality Date   A/V FISTULAGRAM Left 01/13/2021   Procedure: A/V FISTULAGRAM;  Surgeon: Algernon Huxley, MD;  Location: [place] [facility];  Service: Cardiovascular;  Laterality: Left;   A/V SHUNT INTERVENTION Left 05/23/2021   Procedure: A/V SHUNT INTERVENTION;  Surgeon: Algernon Huxley, MD;  Location: [place] [facility];  Service:  Cardiovascular;  Laterality: Left;   ABDOMINAL HYSTERECTOMY     APPENDECTOMY     AV FISTULA PLACEMENT Left 11/04/2020   Procedure: ARTERIOVENOUS (AV) FISTULA CREATION (BRACHIOCEPHALIC);  Surgeon: Algernon Huxley, MD;  Location: [facility];  Service: Vascular;  Laterality: Left;   BACK SURGERY     BREAST BIOPSY Right    2012 positive- [facility]   BREAST LUMPECTOMY Right 2012   F/U radiation    BREAST SURGERY     CARDIAC CATHETERIZATION  2014   CAROTID PTA/STENT INTERVENTION Left 06/14/2020   Procedure: CAROTID PTA/STENT INTERVENTION;  Surgeon: Algernon Huxley, MD;  Location: [place] [facility];  Service: Cardiovascular;  Laterality: Left;   CATARACT EXTRACTION W/PHACO Right 08/14/2017   Procedure: CATARACT EXTRACTION PHACO AND INTRAOCULAR LENS PLACEMENT (IOC);  Surgeon: Birder Robson, MD;  Location: [facility];  Service: Ophthalmology;  Laterality: Right;  [place] 00:43.0 AP% 17.1 CDE 7.37 Fluid Pack lot # [id number]   CATARACT EXTRACTION W/PHACO Left 10/09/2017   Procedure: CATARACT EXTRACTION PHACO AND INTRAOCULAR LENS PLACEMENT (IOC);  Surgeon: Birder Robson, MD;  Location: [facility];  Service: Ophthalmology;  Laterality: Left;  [place] 00:30.3 AP% 11.0 CDE 3.33 Fluid Pack Lot # [id number]   CORONARY ARTERY BYPASS GRAFT  09/05/2012   Procedure: CORONARY ARTERY BYPASS GRAFTING (CABG);  Surgeon: Rexene Alberts, MD;  Location: [place];  Service: Open Heart Surgery;  Laterality: N/A;  CABG x four, using left internal mammary artery and bilateral greater saphenous vein harvested endoscopically   DIALYSIS/PERMA CATHETER INSERTION Left 06/25/2020   Procedure: DIALYSIS/PERMA CATHETER INSERTION;  Surgeon: Algernon Huxley, MD;  Location: [place] [facility];  Service: Cardiovascular;  Laterality: Left;   DIALYSIS/PERMA CATHETER INSERTION N/A 08/10/2020   Procedure: DIALYSIS/PERMA CATHETER INSERTION;  Surgeon: Katha Cabal, MD;  Location: [place] [facility];  Service: Cardiovascular;  Laterality: N/A;   DIALYSIS/PERMA  CATHETER INSERTION N/A 05/23/2021   Procedure: DIALYSIS/PERMA CATHETER INSERTION;  Surgeon: Algernon Huxley, MD;  Location: [place] [facility];  Service: Cardiovascular;  Laterality: N/A;   DIALYSIS/PERMA CATHETER REMOVAL N/A 08/05/2020   Procedure: DIALYSIS/PERMA CATHETER REMOVAL;  Surgeon: Algernon Huxley, MD;  Location: [place] [facility];  Service: Cardiovascular;  Laterality: N/A;   INTRAOPERATIVE TRANSESOPHAGEAL ECHOCARDIOGRAM  09/05/2012   Procedure: INTRAOPERATIVE TRANSESOPHAGEAL ECHOCARDIOGRAM;  Surgeon: Rexene Alberts, MD;  Location: [place];  Service: Open Heart Surgery;  Laterality: N/A;   KNEE SURGERY  Bilateral nerve block   LAPAROSCOPIC NISSEN FUNDOPLICATION     OVARIAN CYST REMOVAL       Social History   Tobacco Use   Smoking status: Former    Years: 3.00    Types: Cigarettes    Quit date: 11/13/1988    Years since quitting: 32.5   Smokeless tobacco: Never   Tobacco comments:    At the most 1 pack would last over a week.  Vaping Use   Vaping Use: Never used  Substance Use Topics   Alcohol use: No   Drug use: No     Family History  Problem Relation Age of Onset   Heart disease Brother        CABG & stents   Hyperlipidemia Brother    Hypertension Brother    Cancer Father    Heart disease Mother    Breast cancer Cousin    Kidney disease Neg Hx      Allergies  Allergen Reactions   Clonidine Other (See Comments) and Shortness Of Breath    Other reaction(s): Other (see comments) Other reaction(s): Other (See Comments)   Darvocet [Propoxyphene N-Acetaminophen] Anaphylaxis   Hydralazine     Other reaction(s): Other (See Comments) glomerulonephritis   Hydralazine Hcl Other (See Comments)    glomerulonephritis   Esomeprazole     Other reaction(s): Unknown   Guaifenesin     Other reaction(s): Unknown Other reaction(s): Unknown Other reaction(s): Unknown Other reaction(s): Unknown   Phenylephrine-Guaifenesin     Other reaction(s): Unknown   Rabeprazole      Other reaction(s): Unknown   Telithromycin     Other reaction(s): Unknown Other reaction(s): Unknown Other reaction(s): Unknown   Fluocinolone Other (See Comments)    Feels crazy Other reaction(s): Other (see comments) Other reaction(s): Other (See Comments) Feels crazy Feels crazy   Iodinated Diagnostic Agents Nausea And Vomiting and Other (See Comments)    Burning Other reaction(s): Other (see comments) Other reaction(s): Other (See Comments) Burning Nausea & vomiting   Levaquin [Levofloxacin] Nausea Only   Statins Other (See Comments)    Myalgia Other reaction(s): Other (See Comments), Unknown, Unknown Myalgia     REVIEW OF SYSTEMS (Negative unless checked)  Constitutional: [] Weight loss  [] Fever  [] Chills Cardiac: [] Chest pain   [] Chest pressure   [] Palpitations   [] Shortness of breath when laying flat   [x] Shortness of breath at rest   [x] Shortness of breath with exertion. Vascular:  [] Pain in legs with walking   [] Pain in legs at rest   [] Pain in legs when laying flat   [] Claudication   [] Pain in feet when walking  [] Pain in feet at rest  [] Pain in feet when laying flat   [] History of DVT   [] Phlebitis   [x] Swelling in legs   [] Varicose veins   [x] Non-healing ulcers Pulmonary:   [] Uses home oxygen   [] Productive cough   [] Hemoptysis   [] Wheeze  [] COPD   [] Asthma Neurologic:  [x] Dizziness  [] Blackouts   [] Seizures   [] History of stroke   [] History of TIA  [] Aphasia   [] Temporary blindness   [] Dysphagia   [] Weakness or numbness in arms   [] Weakness or numbness in legs Musculoskeletal:  [x] Arthritis   [] Joint swelling   [x] Joint pain   [] Low back pain Hematologic:  [] Easy bruising  [] Easy bleeding   [] Hypercoagulable state   [x] Anemic   Gastrointestinal:  [] Blood in stool   [] Vomiting blood  [] Gastroesophageal reflux/heartburn   [] Abdominal pain Genitourinary:  [x] Chronic kidney disease   []   Difficult urination  [] Frequent urination  [] Burning with urination    [] Hematuria Skin:  [] Rashes   [x] Ulcers   [x] Wounds Psychological:  [] History of anxiety   []  History of major depression.  Physical Examination  BP 122/73   Pulse 76   Ht 5\' 4"  (1.626 m)   Wt 180 lb (81.6 kg)   BMI 30.90 kg/m  Gen:  WD/WN, NAD Head: [place]/AT, No temporalis wasting. Ear/Nose/Throat: Hearing grossly intact, nares w/o erythema or drainage Eyes: Conjunctiva clear. Sclera non-icteric Neck: Supple.  Trachea midline Pulmonary:  Good air movement, no use of accessory muscles on supplemental oxygen.  Cardiac: Irregular Vascular:  Vessel Right Left  Radial Palpable Palpable           Musculoskeletal: M/S 5/5 throughout.  No deformity or atrophy.  Quarter sized wound on the right anterior lateral lower leg and a small weeping ulcer on the left ankle.  3+ right lower extremity edema with 2-3+ left lower extremity edema is present Neurologic: Sensation grossly intact in extremities.  Symmetrical.  Speech is fluent.  Psychiatric: Judgment intact, Mood & affect appropriate for pt's clinical situation. Dermatologic: No rashes or ulcers noted.  No cellulitis or open wounds.      Labs Recent Results (from the past 2160 hour(s))  CBC with Differential     Status: Abnormal   Collection Time: 05/21/21  3:45 AM  Result Value Ref Range   WBC 13.1 (H) 4.0 - 10.5 K/uL   RBC 3.27 (L) 3.87 - 5.11 MIL/uL   Hemoglobin 10.1 (L) 12.0 - 15.0 g/dL   HCT 33.7 (L) 36.0 - 46.0 %   MCV 103.1 (H) 80.0 - 100.0 fL   MCH 30.9 26.0 - 34.0 pg   MCHC 30.0 30.0 - 36.0 g/dL   RDW 15.3 11.5 - 15.5 %   Platelets 210 150 - 400 K/uL   nRBC 0.0 0.0 - 0.2 %   Neutrophils Relative % 92 %   Neutro Abs 12.1 (H) 1.7 - 7.7 K/uL   Lymphocytes Relative 3 %   Lymphs Abs 0.3 (L) 0.7 - 4.0 K/uL   Monocytes Relative 4 %   Monocytes Absolute 0.6 0.1 - 1.0 K/uL   Eosinophils Relative 0 %   Eosinophils Absolute 0.0 0.0 - 0.5 K/uL   Basophils Relative 1 %   Basophils Absolute 0.1 0.0 - 0.1 K/uL   Immature  Granulocytes 0 %   Abs Immature Granulocytes 0.05 0.00 - 0.07 K/uL    Comment: Performed at [facility], [address] [place], [place] [address]  Comprehensive metabolic panel     Status: Abnormal   Collection Time: 05/21/21  3:45 AM  Result Value Ref Range   Sodium 135 135 - 145 mmol/L   Potassium 5.3 (H) 3.5 - 5.1 mmol/L   Chloride 102 98 - 111 mmol/L   CO2 26 22 - 32 mmol/L   Glucose, Bld 156 (H) 70 - 99 mg/dL    Comment: Glucose reference range applies only to samples taken after fasting for at least 8 hours.   BUN 51 (H) 8 - 23 mg/dL   Creatinine, Ser 5.99 (H) 0.44 - 1.00 mg/dL   Calcium 7.8 (L) 8.9 - 10.3 mg/dL   Total Protein 7.9 6.5 - 8.1 g/dL   Albumin 3.0 (L) 3.5 - 5.0 g/dL   AST 14 (L) 15 - 41 U/L   ALT 8 0 - 44 U/L   Alkaline Phosphatase 102 38 - 126 U/L   Total Bilirubin 0.7  0.3 - 1.2 mg/dL   GFR, Estimated 6 (L) >60 mL/min    Comment: (NOTE) Calculated using the CKD-EPI Creatinine Equation (2021)    Anion gap 7 5 - 15    Comment: Performed at [facility], [address] [place], [place] [address]  Brain natriuretic peptide     Status: Abnormal   Collection Time: 05/21/21  3:45 AM  Result Value Ref Range   B Natriuretic Peptide 1,955.9 (H) 0.0 - 100.0 pg/mL    Comment: Performed at [facility], [place], [place] [address]  Troponin I (High Sensitivity)     Status: None   Collection Time: 05/21/21  3:45 AM  Result Value Ref Range   Troponin I (High Sensitivity) 12 <18 ng/L    Comment: (NOTE) Elevated high sensitivity troponin I (hsTnI) values and significant  changes across serial measurements may suggest ACS but many other  chronic and acute conditions are known to elevate hsTnI results.  Refer to the "Links" section for chest pain algorithms and additional  guidance. Performed at [facility], [address] [place], [place] [address]   Resp Panel by RT-PCR (Flu A&B, Covid) Nasopharyngeal Swab      Status: None   Collection Time: 05/21/21  3:45 AM   Specimen: Nasopharyngeal Swab; Nasopharyngeal(NP) swabs in vial transport medium  Result Value Ref Range   SARS Coronavirus 2 by RT PCR NEGATIVE NEGATIVE    Comment: (NOTE) SARS-CoV-2 target nucleic acids are NOT DETECTED.  The SARS-CoV-2 RNA is generally detectable in upper respiratory specimens during the acute phase of infection. The lowest concentration of SARS-CoV-2 viral copies this assay can detect is 138 copies/mL. A negative result does not preclude SARS-Cov-2 infection and should not be used as the sole basis for treatment or other patient management decisions. A negative result may occur with  improper specimen collection/handling, submission of specimen other than nasopharyngeal swab, presence of viral mutation(s) within the areas targeted by this assay, and inadequate number of viral copies(<138 copies/mL). A negative result must be combined with clinical observations, patient history, and epidemiological information. The expected result is Negative.  Fact Sheet for Patients:  EntrepreneurPulse.com.au  Fact Sheet for Healthcare Providers:  IncredibleEmployment.be  This test is no t yet approved or cleared by the [place] FDA and  has been authorized for detection and/or diagnosis of SARS-CoV-2 by FDA under an Emergency Use Authorization (EUA). This EUA will remain  in effect (meaning this test can be used) for the duration of the COVID-19 declaration under Section 564(b)(1) of the Act, 21 U.S.C.section 360bbb-3(b)(1), unless the authorization is terminated  or revoked sooner.       Influenza A by PCR NEGATIVE NEGATIVE   Influenza B by PCR NEGATIVE NEGATIVE    Comment: (NOTE) The Xpert Xpress SARS-CoV-2/FLU/RSV plus assay is intended as an aid in the diagnosis of influenza from Nasopharyngeal swab specimens and should not be used as a sole basis for treatment. Nasal  washings and aspirates are unacceptable for Xpert Xpress SARS-CoV-2/FLU/RSV testing.  Fact Sheet for Patients: EntrepreneurPulse.com.au  Fact Sheet for Healthcare Providers: IncredibleEmployment.be  This test is not yet approved or cleared by the [place] FDA and has been authorized for detection and/or diagnosis of SARS-CoV-2 by FDA under an Emergency Use Authorization (EUA). This EUA will remain in effect (meaning this test can be used) for the duration of the COVID-19 declaration under Section 564(b)(1) of the Act, 21 U.S.C. section 360bbb-3(b)(1), unless the authorization is  terminated or revoked.  Performed at [facility], [address], [place] [address]   Culture, blood (routine x 2)     Status: Abnormal   Collection Time: 05/21/21  3:45 AM   Specimen: BLOOD  Result Value Ref Range   Specimen Description      BLOOD RIGHT ASSIST CONTROL Performed at [facility], [address], [place], [place] [address]    Special Requests      BOTTLES DRAWN AEROBIC AND ANAEROBIC Blood Culture adequate volume Performed at [facility], [address] [place], [place] [address]    Culture  Setup Time      Organism ID to follow GRAM NEGATIVE RODS AEROBIC BOTTLE ONLY IN BOTH AEROBIC AND ANAEROBIC BOTTLES CRITICAL RESULT CALLED TO, READ BACK BY AND VERIFIED WITH: SCHEMA HALLAJI AT 8937 ON 05/22/21 BY SS Performed at [facility], [address], [place], [place] [address]    Culture (A)     [place] Standardized susceptibility testing for this organism is not available. Performed at [facility], [place] [address] [place], [place] [address]    Report Status 05/25/2021 FINAL   Lactic acid, plasma     Status: Abnormal   Collection Time: 05/21/21  3:45 AM  Result Value Ref Range   Lactic Acid, Venous 2.0 (HH) 0.5 - 1.9 mmol/L    Comment: CRITICAL RESULT CALLED TO, READ  BACK BY AND VERIFIED WITH ANNA JASPER AT [phone] 05/21/2021 DLB Performed at [facility], [address] [place],  [address]   Procalcitonin - Baseline     Status: None   Collection Time: 05/21/21  3:45 AM  Result Value Ref Range   Procalcitonin 0.77 ng/mL    Comment:        Interpretation: PCT > 0.5 ng/mL and <= 2 ng/mL: Systemic infection (sepsis) is possible, but other conditions are known to elevate PCT as well. (NOTE)       Sepsis PCT Algorithm           Lower Respiratory Tract                                      Infection PCT Algorithm    ----------------------------     ----------------------------         PCT < 0.25 ng/mL                PCT < 0.10 ng/mL          Strongly encourage             Strongly discourage   discontinuation of antibiotics    initiation of antibiotics    ----------------------------     -----------------------------       PCT 0.25 - 0.50 ng/mL            PCT 0.10 - 0.25 ng/mL               OR       >80% decrease in PCT            Discourage initiation of                                            antibiotics      Encourage discontinuation  of antibiotics    ----------------------------     -----------------------------         PCT >= 0.50 ng/mL              PCT 0.26 - 0.50 ng/mL                AND       <80% decrease in PCT             Encourage initiation of                                             antibiotics       Encourage continuation           of antibiotics    ----------------------------     -----------------------------        PCT >= 0.50 ng/mL                  PCT > 0.50 ng/mL               AND         increase in PCT                  Strongly encourage                                      initiation of antibiotics    Strongly encourage escalation           of antibiotics                                     -----------------------------                                           PCT <= 0.25 ng/mL                                                  OR                                        > 80% decrease in PCT                                      Discontinue / Do not initiate                                             antibiotics  Performed at [facility], [address] [place], [place] [address]   Blood Culture ID Panel (Reflexed)     Status: None   Collection Time: 05/21/21  3:45 AM  Result Value Ref Range   Enterococcus faecalis NOT DETECTED  NOT DETECTED   Enterococcus Faecium NOT DETECTED NOT DETECTED   Listeria monocytogenes NOT DETECTED NOT DETECTED   Staphylococcus species NOT DETECTED NOT DETECTED   Staphylococcus aureus (BCID) NOT DETECTED NOT DETECTED   Staphylococcus epidermidis NOT DETECTED NOT DETECTED   Staphylococcus lugdunensis NOT DETECTED NOT DETECTED   Streptococcus species NOT DETECTED NOT DETECTED   Streptococcus agalactiae NOT DETECTED NOT DETECTED   Streptococcus pneumoniae NOT DETECTED NOT DETECTED   Streptococcus pyogenes NOT DETECTED NOT DETECTED   A.calcoaceticus-baumannii NOT DETECTED NOT DETECTED   Bacteroides fragilis NOT DETECTED NOT DETECTED   Enterobacterales NOT DETECTED NOT DETECTED   Enterobacter cloacae complex NOT DETECTED NOT DETECTED   Escherichia coli NOT DETECTED NOT DETECTED   Klebsiella aerogenes NOT DETECTED NOT DETECTED   Klebsiella oxytoca NOT DETECTED NOT DETECTED   Klebsiella pneumoniae NOT DETECTED NOT DETECTED   Proteus species NOT DETECTED NOT DETECTED   Salmonella species NOT DETECTED NOT DETECTED   Serratia marcescens NOT DETECTED NOT DETECTED   Haemophilus influenzae NOT DETECTED NOT DETECTED   Neisseria meningitidis NOT DETECTED NOT DETECTED   Pseudomonas aeruginosa NOT DETECTED NOT DETECTED   Stenotrophomonas maltophilia NOT DETECTED NOT DETECTED   Candida albicans NOT DETECTED NOT DETECTED   Candida auris NOT DETECTED NOT DETECTED   Candida glabrata NOT DETECTED NOT DETECTED   Candida krusei NOT DETECTED NOT  DETECTED   Candida parapsilosis NOT DETECTED NOT DETECTED   Candida tropicalis NOT DETECTED NOT DETECTED   Cryptococcus neoformans/gattii NOT DETECTED NOT DETECTED    Comment: Performed at [facility], [address] [place], [place] [address]  Culture, blood (routine x 2)     Status: Abnormal   Collection Time: 05/21/21  4:05 AM   Specimen: BLOOD  Result Value Ref Range   Specimen Description      BLOOD RIGHT HAND Performed at [facility], [address] [place], [place] [address]    Special Requests      BOTTLES DRAWN AEROBIC ONLY Blood Culture results may not be optimal due to an inadequate volume of blood received in culture bottles Performed at [facility], [address] [place], [place] [address]    Culture  Setup Time AEROBIC BOTTLE ONLY GRAM NEGATIVE RODS     Culture (A)     SPHINGOBACTERIUM MULTIVORUM SEE SEPARATE REPORT FOR SUSCEPTIBILITY RESULTS Performed at [employer] Performed at [facility], [address], [place], [place] [address]    Report Status 05/31/2021 FINAL   Susceptibility, Aer + Anaerob     Status: Abnormal   Collection Time: 05/21/21  4:05 AM  Result Value Ref Range   Suscept, Aer + Anaerob Final report (A)     Comment: (NOTE) Performed At: [facility] [address] [place], [place] [id number] Rush Farmer MD MV:[id number] CORRECTED ON 08/28 AT 1035: PREVIOUSLY REPORTED AS Preliminary report    Source of Sample      [id number] SUSCEPTIBILITY FOR SPHINGOBACTERIUM MULTIVORUM BLOOD CULTURE    Comment: Performed at [facility], [place] [address] [place], [place] [address]  Susceptibility Result     Status: Abnormal   Collection Time: 05/21/21  4:05 AM  Result Value Ref Range   Suscept Result 1 Comment (A)     Comment: (NOTE) Sphingobacterium multivorum Identification performed by account, not confirmed by this laboratory.    Antimicrobial Suscept Comment     Comment: (NOTE)       ** S = Susceptible; I = Intermediate; R =  Resistant **                   P = Positive; N = Negative            MICS are expressed in micrograms per mL   Antibiotic                 RSLT#1    RSLT#2    RSLT#3    RSLT#4 Amikacin                       R Aztreonam                      R Cefepime                       S Cefotaxime                     S Ceftazidime                    S Ceftriaxone                    S Ciprofloxacin                  S Gentamicin                     R Imipenem                       R Levofloxacin                   S Meropenem                      S Piperacillin/Tazobactam        I Ticarcillin/Clavulanate        S Tobramycin                     R Trimethoprim/Sulfa             S Performed At: [facility] [place], [place] [id number] Rush Farmer MD PX:[id number]   Lactic acid, plasma     Status: Abnormal   Collection Time: 05/21/21  7:00 AM  Result Value Ref Range   Lactic Acid, Venous 2.1 (HH) 0.5 - 1.9 mmol/L    Comment: CRITICAL VALUE NOTED. VALUE IS CONSISTENT WITH PREVIOUSLY REPORTED/CALLED VALUE SCS Performed at [facility], [place], [place] [address]   Troponin I (High Sensitivity)     Status: Abnormal   Collection Time: 05/21/21  7:00 AM  Result Value Ref Range   Troponin I (High Sensitivity) 56 (H) <18 ng/L    Comment: READ BACK AND VERIFIED WITH MAGGIE BARBER RN @0814  05/21/21 SCS (NOTE) Elevated high sensitivity troponin I (hsTnI) values and significant  changes across serial measurements may suggest ACS but many other  chronic and acute conditions are known to elevate hsTnI results.  Refer to the "Links" section for chest pain algorithms and additional  guidance. Performed at [facility], [address] [place], [place] [address]   Hemoglobin A1c     Status: Abnormal   Collection Time: 05/21/21  7:00 AM  Result Value Ref Range   Hgb A1c MFr Bld 4.4 (L) 4.8 - 5.6 %     Comment: (NOTE) Pre  diabetes:          5.7%-6.4%  Diabetes:              >6.4%  Glycemic control for   <7.0% adults with diabetes    Mean Plasma Glucose 79.58 mg/dL    Comment: Performed at [facility] [address], [place], [place] [address]  CBC     Status: Abnormal   Collection Time: 05/21/21  7:00 AM  Result Value Ref Range   WBC 15.2 (H) 4.0 - 10.5 K/uL   RBC 2.98 (L) 3.87 - 5.11 MIL/uL   Hemoglobin 9.3 (L) 12.0 - 15.0 g/dL   HCT 30.7 (L) 36.0 - 46.0 %   MCV 103.0 (H) 80.0 - 100.0 fL   MCH 31.2 26.0 - 34.0 pg   MCHC 30.3 30.0 - 36.0 g/dL   RDW 15.5 11.5 - 15.5 %   Platelets 180 150 - 400 K/uL   nRBC 0.0 0.0 - 0.2 %    Comment: Performed at [facility], [address] [place], [place] [address]  CBG monitoring, ED     Status: None   Collection Time: 05/21/21  7:23 AM  Result Value Ref Range   Glucose-Capillary 98 70 - 99 mg/dL    Comment: Glucose reference range applies only to samples taken after fasting for at least 8 hours.  CBG monitoring, ED     Status: None   Collection Time: 05/21/21 10:23 AM  Result Value Ref Range   Glucose-Capillary 99 70 - 99 mg/dL    Comment: Glucose reference range applies only to samples taken after fasting for at least 8 hours.  Glucose, capillary     Status: None   Collection Time: 05/21/21  8:52 PM  Result Value Ref Range   Glucose-Capillary 97 70 - 99 mg/dL    Comment: Glucose reference range applies only to samples taken after fasting for at least 8 hours.  Basic metabolic panel     Status: Abnormal   Collection Time: 05/22/21  4:50 AM  Result Value Ref Range   Sodium 134 (L) 135 - 145 mmol/L   Potassium 4.5 3.5 - 5.1 mmol/L   Chloride 100 98 - 111 mmol/L   CO2 25 22 - 32 mmol/L   Glucose, Bld 84 70 - 99 mg/dL    Comment: Glucose reference range applies only to samples taken after fasting for at least 8 hours.   BUN 32 (H) 8 - 23 mg/dL   Creatinine, Ser 4.07 (H) 0.44 - 1.00 mg/dL   Calcium 7.8 (L) 8.9 -  10.3 mg/dL   GFR, Estimated 10 (L) >60 mL/min    Comment: (NOTE) Calculated using the CKD-EPI Creatinine Equation (2021)    Anion gap 9 5 - 15    Comment: Performed at [facility], [address] [place], [place] [address]  CBC     Status: Abnormal   Collection Time: 05/22/21  4:50 AM  Result Value Ref Range   WBC 9.8 4.0 - 10.5 K/uL   RBC 2.97 (L) 3.87 - 5.11 MIL/uL   Hemoglobin 9.1 (L) 12.0 - 15.0 g/dL   HCT 32.3 (L) 36.0 - 46.0 %   MCV 108.8 (H) 80.0 - 100.0 fL   MCH 30.6 26.0 - 34.0 pg   MCHC 28.2 (L) 30.0 - 36.0 g/dL   RDW 15.6 (H) 11.5 - 15.5 %   Platelets 131 (L) 150 - 400 K/uL    Comment: Immature Platelet Fraction may be clinically  indicated, consider ordering this additional test [id number]    nRBC 0.0 0.0 - 0.2 %    Comment: Performed at [facility], [address] [place], [place] [address]  Hepatitis B surface antigen     Status: None   Collection Time: 05/22/21  6:25 AM  Result Value Ref Range   Hepatitis B Surface Ag NON REACTIVE NON REACTIVE    Comment: Performed at [facility] [address] [place], [place] [address]  Glucose, capillary     Status: None   Collection Time: 05/22/21  7:59 AM  Result Value Ref Range   Glucose-Capillary 84 70 - 99 mg/dL    Comment: Glucose reference range applies only to samples taken after fasting for at least 8 hours.  Glucose, capillary     Status: Abnormal   Collection Time: 05/22/21 11:24 AM  Result Value Ref Range   Glucose-Capillary 103 (H) 70 - 99 mg/dL    Comment: Glucose reference range applies only to samples taken after fasting for at least 8 hours.  Vitamin B12     Status: Abnormal   Collection Time: 05/22/21  4:05 PM  Result Value Ref Range   Vitamin B-12 1,056 (H) 180 - 914 pg/mL    Comment: (NOTE) This assay is not validated for testing neonatal or myeloproliferative syndrome specimens for Vitamin B12 levels. Performed at [facility], [place] [address], [place],  [place] [address]   Glucose, capillary     Status: None   Collection Time: 05/22/21  4:29 PM  Result Value Ref Range   Glucose-Capillary 78 70 - 99 mg/dL    Comment: Glucose reference range applies only to samples taken after fasting for at least 8 hours.  Glucose, capillary     Status: None   Collection Time: 05/22/21  8:22 PM  Result Value Ref Range   Glucose-Capillary 94 70 - 99 mg/dL    Comment: Glucose reference range applies only to samples taken after fasting for at least 8 hours.  Basic metabolic panel     Status: Abnormal   Collection Time: 05/23/21  5:22 AM  Result Value Ref Range   Sodium 134 (L) 135 - 145 mmol/L   Potassium 4.6 3.5 - 5.1 mmol/L   Chloride 99 98 - 111 mmol/L   CO2 28 22 - 32 mmol/L   Glucose, Bld 83 70 - 99 mg/dL    Comment: Glucose reference range applies only to samples taken after fasting for at least 8 hours.   BUN 45 (H) 8 - 23 mg/dL   Creatinine, Ser 5.08 (H) 0.44 - 1.00 mg/dL   Calcium 7.8 (L) 8.9 - 10.3 mg/dL   GFR, Estimated 8 (L) >60 mL/min    Comment: (NOTE) Calculated using the CKD-EPI Creatinine Equation (2021)    Anion gap 7 5 - 15    Comment: Performed at [facility], [address] [place], [place] [address]  Phosphorus     Status: None   Collection Time: 05/23/21  5:22 AM  Result Value Ref Range   Phosphorus 4.5 2.5 - 4.6 mg/dL    Comment: Performed at [facility], [address] [place], [place] [address]  CBC     Status: Abnormal   Collection Time: 05/23/21  6:36 AM  Result Value Ref Range   WBC 7.4 4.0 - 10.5 K/uL   RBC 3.05 (L) 3.87 - 5.11 MIL/uL   Hemoglobin 9.2 (L) 12.0 - 15.0 g/dL   HCT 30.5 (  L) 36.0 - 46.0 %   MCV 100.0 80.0 - 100.0 fL   MCH 30.2 26.0 - 34.0 pg   MCHC 30.2 30.0 - 36.0 g/dL   RDW 15.4 11.5 - 15.5 %   Platelets 166 150 - 400 K/uL   nRBC 0.0 0.0 - 0.2 %    Comment: Performed at [facility], [address] [place], [place] [address]  Glucose, capillary     Status: None    Collection Time: 05/23/21  8:30 AM  Result Value Ref Range   Glucose-Capillary 74 70 - 99 mg/dL    Comment: Glucose reference range applies only to samples taken after fasting for at least 8 hours.  Glucose, capillary     Status: None   Collection Time: 05/23/21 12:03 PM  Result Value Ref Range   Glucose-Capillary 71 70 - 99 mg/dL    Comment: Glucose reference range applies only to samples taken after fasting for at least 8 hours.  Glucose, capillary     Status: Abnormal   Collection Time: 05/23/21  6:42 PM  Result Value Ref Range   Glucose-Capillary 182 (H) 70 - 99 mg/dL    Comment: Glucose reference range applies only to samples taken after fasting for at least 8 hours.  Glucose, capillary     Status: Abnormal   Collection Time: 05/23/21  6:45 PM  Result Value Ref Range   Glucose-Capillary 180 (H) 70 - 99 mg/dL    Comment: Glucose reference range applies only to samples taken after fasting for at least 8 hours.  Glucose, capillary     Status: Abnormal   Collection Time: 05/23/21  9:12 PM  Result Value Ref Range   Glucose-Capillary 182 (H) 70 - 99 mg/dL    Comment: Glucose reference range applies only to samples taken after fasting for at least 8 hours.  Glucose, capillary     Status: Abnormal   Collection Time: 05/24/21  7:40 AM  Result Value Ref Range   Glucose-Capillary 126 (H) 70 - 99 mg/dL    Comment: Glucose reference range applies only to samples taken after fasting for at least 8 hours.  Glucose, capillary     Status: None   Collection Time: 05/24/21 11:14 AM  Result Value Ref Range   Glucose-Capillary 97 70 - 99 mg/dL    Comment: Glucose reference range applies only to samples taken after fasting for at least 8 hours.  Cath Tip Culture     Status: Abnormal   Collection Time: 05/24/21  1:27 PM   Specimen: Catheter Tip; Other  Result Value Ref Range   Specimen Description      CATH TIP Performed at [facility], [address] [place], [place] [address]     Special Requests      NONE Performed at [facility], [address] [place], [place] [address]    Culture (A)     2,000 COLONIES/mL COAGULASE NEGATIVE STAPHYLOCOCCUS   Report Status 05/26/2021 FINAL   ECHOCARDIOGRAM COMPLETE     Status: None   Collection Time: 05/24/21  2:30 PM  Result Value Ref Range   Weight 2,884.8 oz   Height 64 in   BP 127/76 mmHg   Ao pk vel 1.89 m/s   AV Area VTI 1.51 cm2   AR max vel 1.24 cm2   AV Mean grad 9.0 mmHg   AV Peak grad 14.3 mmHg   S' Lateral 3.58 cm   AV Area mean vel 1.37 cm2  Area-P 1/2 4.10 cm2  Glucose, capillary     Status: Abnormal   Collection Time: 05/24/21  4:22 PM  Result Value Ref Range   Glucose-Capillary 114 (H) 70 - 99 mg/dL    Comment: Glucose reference range applies only to samples taken after fasting for at least 8 hours.  Glucose, capillary     Status: Abnormal   Collection Time: 05/24/21 10:16 PM  Result Value Ref Range   Glucose-Capillary 100 (H) 70 - 99 mg/dL    Comment: Glucose reference range applies only to samples taken after fasting for at least 8 hours.  Basic metabolic panel     Status: Abnormal   Collection Time: 05/25/21  5:41 AM  Result Value Ref Range   Sodium 132 (L) 135 - 145 mmol/L   Potassium 4.1 3.5 - 5.1 mmol/L   Chloride 96 (L) 98 - 111 mmol/L   CO2 28 22 - 32 mmol/L   Glucose, Bld 84 70 - 99 mg/dL    Comment: Glucose reference range applies only to samples taken after fasting for at least 8 hours.   BUN 50 (H) 8 - 23 mg/dL   Creatinine, Ser 4.96 (H) 0.44 - 1.00 mg/dL   Calcium 7.8 (L) 8.9 - 10.3 mg/dL   GFR, Estimated 8 (L) >60 mL/min    Comment: (NOTE) Calculated using the CKD-EPI Creatinine Equation (2021)    Anion gap 8 5 - 15    Comment: Performed at [facility], [address] [place], [place] [address]  CBC     Status: Abnormal   Collection Time: 05/25/21  5:41 AM  Result Value Ref Range   WBC 5.4 4.0 - 10.5 K/uL   RBC 2.86 (L) 3.87 - 5.11 MIL/uL    Hemoglobin 8.7 (L) 12.0 - 15.0 g/dL   HCT 28.4 (L) 36.0 - 46.0 %   MCV 99.3 80.0 - 100.0 fL   MCH 30.4 26.0 - 34.0 pg   MCHC 30.6 30.0 - 36.0 g/dL   RDW 15.1 11.5 - 15.5 %   Platelets 215 150 - 400 K/uL   nRBC 0.0 0.0 - 0.2 %    Comment: Performed at [facility], [address] [place], [place] [address]  Glucose, capillary     Status: None   Collection Time: 05/25/21  7:43 AM  Result Value Ref Range   Glucose-Capillary 78 70 - 99 mg/dL    Comment: Glucose reference range applies only to samples taken after fasting for at least 8 hours.  Glucose, capillary     Status: None   Collection Time: 05/25/21 11:26 AM  Result Value Ref Range   Glucose-Capillary 92 70 - 99 mg/dL    Comment: Glucose reference range applies only to samples taken after fasting for at least 8 hours.    Radiology PERIPHERAL VASCULAR CATHETERIZATION  Result Date: 05/23/2021 See surgical note for result.  DG Chest Port 1 View  Result Date: 05/21/2021 CLINICAL DATA:  Shortness of breath EXAM: PORTABLE CHEST 1 VIEW COMPARISON:  01/13/2021 FINDINGS: Moderate cardiomegaly. Mild pulmonary edema. Small pleural effusions. Remote median sternotomy. IMPRESSION: Cardiomegaly and mild pulmonary edema. Electronically Signed   By: Ulyses Jarred M.D.   On: 05/21/2021 03:45   ECHOCARDIOGRAM COMPLETE  Result Date: 05/24/2021    ECHOCARDIOGRAM REPORT   Patient Name:   [name] Date of Exam: 05/24/2021 Medical Rec #:  [id number]         Height:       64.0 in Accession #:  [id number]        Weight:       180.3 lb Date of Birth:  [birth year]         BSA:          1.872 m Patient Age:    38 years          BP:           127/76 mmHg Patient Gender: F                 HR:           80 bpm. Exam Location:  [facility] Procedure: 2D Echo, Cardiac Doppler and Color Doppler Indications:     Bacteremia R78.81  History:         Patient has prior history of Echocardiogram examinations, most                  recent 08/05/2020. CAD; Risk  Factors:Hypertension and Diabetes.  Sonographer:     Sherrie Sport Referring Phys:  [address] Diagnosing Phys: Nelva Bush MD  Sonographer Comments: Suboptimal apical window. IMPRESSIONS  1. Left ventricular ejection fraction, by estimation, is 35 to 40%. The left ventricle has moderately decreased function. The left ventricle demonstrates global hypokinesis. There is mild left ventricular hypertrophy. Left ventricular diastolic parameters are consistent with Grade II diastolic dysfunction (pseudonormalization). Elevated left atrial pressure.  2. Right ventricular systolic function is mildly reduced. The right ventricular size is mildly enlarged. Tricuspid regurgitation signal is inadequate for assessing PA pressure.  3. Left atrial size was moderately dilated.  4. Right atrial size was moderately dilated.  5. The mitral valve is normal in structure. Moderate mitral valve regurgitation. No evidence of mitral stenosis.  6. The aortic valve has an indeterminant number of cusps. There is moderate calcification of the aortic valve. There is moderate thickening of the aortic valve. Aortic valve regurgitation is not visualized. Mild aortic valve stenosis. Aortic valve area,  by VTI measures 1.51 cm. Aortic valve mean gradient measures 9.0 mmHg. Conclusion(s)/Recommendation(s): There is no definite evidence of endocarditis, though native valve disease is present. Consider TEE for further evaluation, as clinically indicated. FINDINGS  Left Ventricle: Left ventricular ejection fraction, by estimation, is 35 to 40%. The left ventricle has moderately decreased function. The left ventricle demonstrates global hypokinesis. The left ventricular internal cavity size was normal in size. There is mild left ventricular hypertrophy. Left ventricular diastolic parameters are consistent with Grade II diastolic dysfunction (pseudonormalization). Elevated left atrial pressure. Right Ventricle: The right ventricular size  is mildly enlarged. No increase in right ventricular wall thickness. Right ventricular systolic function is mildly reduced. Tricuspid regurgitation signal is inadequate for assessing PA pressure. Left Atrium: Left atrial size was moderately dilated. Right Atrium: Right atrial size was moderately dilated. Pericardium: The pericardium was not well visualized. Mitral Valve: The mitral valve is normal in structure. Moderate mitral valve regurgitation. No evidence of mitral valve stenosis. Tricuspid Valve: The tricuspid valve is not well visualized. Tricuspid valve regurgitation is mild. Aortic Valve: The aortic valve has an indeterminant number of cusps. There is moderate calcification of the aortic valve. There is moderate thickening of the aortic valve. Aortic valve regurgitation is not visualized. Mild aortic stenosis is present. Aortic valve mean gradient measures 9.0 mmHg. Aortic valve peak gradient measures 14.3 mmHg. Aortic valve area, by VTI measures 1.51 cm. Pulmonic Valve: The pulmonic valve was not well visualized. Pulmonic valve regurgitation is not visualized. No evidence of pulmonic  stenosis. Aorta: The aortic root is normal in size and structure. Pulmonary Artery: The pulmonary artery is of normal size. IAS/Shunts: The interatrial septum was not well visualized.  LEFT VENTRICLE PLAX 2D LVIDd:         4.88 cm  Diastology LVIDs:         3.58 cm  LV e' medial:    5.33 cm/s LV PW:         1.23 cm  LV E/e' medial:  21.6 LV IVS:        1.01 cm  LV e' lateral:   5.77 cm/s LVOT diam:     2.00 cm  LV E/e' lateral: 19.9 LV SV:         56 LV SV Index:   30 LVOT Area:     3.14 cm  RIGHT VENTRICLE RV Basal diam:  4.27 cm RV S prime:     9.03 cm/s TAPSE (M-mode): 1.4 cm LEFT ATRIUM              Index       RIGHT ATRIUM           Index LA diam:        5.80 cm  3.10 cm/m  RA Area:     26.30 cm LA Vol (A2C):   121.0 ml 64.64 ml/m RA Volume:   82.70 ml  44.18 ml/m LA Vol (A4C):   81.1 ml  43.32 ml/m LA Biplane Vol:  98.8 ml  52.78 ml/m  AORTIC VALVE                    PULMONIC VALVE AV Area (Vmax):    1.24 cm     PV Vmax:        0.80 m/s AV Area (Vmean):   1.37 cm     PV Peak grad:   2.6 mmHg AV Area (VTI):     1.51 cm     RVOT Peak grad: 3 mmHg AV Vmax:           189.00 cm/s AV Vmean:          135.000 cm/s AV VTI:            0.371 m AV Peak Grad:      14.3 mmHg AV Mean Grad:      9.0 mmHg LVOT Vmax:         74.40 cm/s LVOT Vmean:        59.000 cm/s LVOT VTI:          0.178 m LVOT/AV VTI ratio: 0.48  AORTA Ao Root diam: 2.60 cm MITRAL VALVE MV Area (PHT): 4.10 cm     SHUNTS MV Decel Time: 185 msec     Systemic VTI:  0.18 m MV E velocity: 115.00 cm/s  Systemic Diam: 2.00 cm MV A velocity: 92.10 cm/s MV E/A ratio:  1.25 Harrell Gave End MD Electronically signed by Nelva Bush MD Signature Date/Time: 05/24/2021/4:57:32 PM    Final     Assessment/Plan Diabetes mellitus (HCC) blood glucose control important in reducing the progression of atherosclerotic disease. Also, involved in wound healing. On appropriate medications.     Hyperlipidemia lipid control important in reducing the progression of atherosclerotic disease. Continue statin therapy   Carotid stenosis We performed a carotid duplex recently which correlates much more with her catheter-based angiogram findings and would be in the 40 to 59% range bilaterally.  Particularly given her extensive medical comorbidities, no intervention or surgery would not  be recommended at this time.  Continue current medical regimen.  Recheck later this year.   HTN, goal below 140/80 blood pressure control important in reducing the progression of atherosclerotic disease. On appropriate oral medications.  Swelling of limb Her swelling is markedly worse.  Given her extensive medical comorbidities and immobility, this is not entirely surprising.  They have tried to use compression socks and made a skin tear on the leg which is also a problem.  We will put her back in a 3  layer Unna boot with 3 layer Unna boot was placed today and will be changed weekly.  Recheck in about a month.  ESRD (end stage renal disease) on dialysis [facility]) Her access is actually working pretty well now.  It has good thrill today.  It has some mild bruising.  Lower limb ulcer, ankle, right, limited to breakdown of skin ([place]) The patient has a new ulceration in the right lower leg just above the ankle from trying to put on compression socks and also has weeping from the left ankle area.  3 layer Unna boots were placed today and will be changed weekly.    [name], MD  05/31/2021 3:44 PM    This note was created with Dragon medical transcription system.  Any errors from dictation are purely unintentional

## 2021-05-31 NOTE — Assessment & Plan Note (Signed)
Her access is actually working pretty well now.  It has good thrill today.  It has some mild bruising.

## 2021-05-31 NOTE — Assessment & Plan Note (Signed)
Her swelling is markedly worse.  Given her extensive medical comorbidities and immobility, this is not entirely surprising.  They have tried to use compression socks and made a skin tear on the leg which is also a problem.  We will put her back in a 3 layer Unna boot with 3 layer Unna boot was placed today and will be changed weekly.  Recheck in about a month.

## 2021-06-07 ENCOUNTER — Encounter (INDEPENDENT_AMBULATORY_CARE_PROVIDER_SITE_OTHER): Payer: Medicare Other

## 2021-06-08 ENCOUNTER — Emergency Department: Payer: Medicare Other

## 2021-06-08 ENCOUNTER — Emergency Department
Admission: EM | Admit: 2021-06-08 | Discharge: 2021-06-08 | Disposition: A | Discharge status: Home / Self Care | Payer: Medicare Other | Source: Home / Self Care | Attending: Emergency Medicine | Admitting: Emergency Medicine

## 2021-06-08 DIAGNOSIS — I5031 Acute diastolic (congestive) heart failure: Secondary | ICD-10-CM | POA: Insufficient documentation

## 2021-06-08 DIAGNOSIS — E039 Hypothyroidism, unspecified: Secondary | ICD-10-CM | POA: Insufficient documentation

## 2021-06-08 DIAGNOSIS — U071 COVID-19: Secondary | ICD-10-CM | POA: Insufficient documentation

## 2021-06-08 DIAGNOSIS — Z7902 Long term (current) use of antithrombotics/antiplatelets: Secondary | ICD-10-CM | POA: Insufficient documentation

## 2021-06-08 DIAGNOSIS — Z79899 Other long term (current) drug therapy: Secondary | ICD-10-CM | POA: Insufficient documentation

## 2021-06-08 DIAGNOSIS — Z853 Personal history of malignant neoplasm of breast: Secondary | ICD-10-CM | POA: Insufficient documentation

## 2021-06-08 DIAGNOSIS — I2510 Atherosclerotic heart disease of native coronary artery without angina pectoris: Secondary | ICD-10-CM | POA: Insufficient documentation

## 2021-06-08 DIAGNOSIS — Z87891 Personal history of nicotine dependence: Secondary | ICD-10-CM | POA: Insufficient documentation

## 2021-06-08 DIAGNOSIS — I132 Hypertensive heart and chronic kidney disease with heart failure and with stage 5 chronic kidney disease, or end stage renal disease: Secondary | ICD-10-CM | POA: Insufficient documentation

## 2021-06-08 DIAGNOSIS — R5381 Other malaise: Secondary | ICD-10-CM

## 2021-06-08 DIAGNOSIS — Z951 Presence of aortocoronary bypass graft: Secondary | ICD-10-CM | POA: Insufficient documentation

## 2021-06-08 DIAGNOSIS — N186 End stage renal disease: Secondary | ICD-10-CM | POA: Insufficient documentation

## 2021-06-08 DIAGNOSIS — Z992 Dependence on renal dialysis: Secondary | ICD-10-CM | POA: Insufficient documentation

## 2021-06-08 DIAGNOSIS — R531 Weakness: Secondary | ICD-10-CM | POA: Diagnosis not present

## 2021-06-08 LAB — CBC WITH DIFFERENTIAL/PLATELET
Abs Immature Granulocytes: 0.05 10*3/uL (ref 0.00–0.07)
Basophils Absolute: 0.1 10*3/uL (ref 0.0–0.1)
Basophils Relative: 1 %
Eosinophils Absolute: 0.1 10*3/uL (ref 0.0–0.5)
Eosinophils Relative: 2 %
HCT: 30.6 % — ABNORMAL LOW (ref 36.0–46.0)
Hemoglobin: 9.1 g/dL — ABNORMAL LOW (ref 12.0–15.0)
Immature Granulocytes: 1 %
Lymphocytes Relative: 15 %
Lymphs Abs: 0.8 10*3/uL (ref 0.7–4.0)
MCH: 30 pg (ref 26.0–34.0)
MCHC: 29.7 g/dL — ABNORMAL LOW (ref 30.0–36.0)
MCV: 101 fL — ABNORMAL HIGH (ref 80.0–100.0)
Monocytes Absolute: 0.5 10*3/uL (ref 0.1–1.0)
Monocytes Relative: 9 %
Neutro Abs: 3.9 10*3/uL (ref 1.7–7.7)
Neutrophils Relative %: 72 %
Platelets: 149 10*3/uL — ABNORMAL LOW (ref 150–400)
RBC: 3.03 MIL/uL — ABNORMAL LOW (ref 3.87–5.11)
RDW: 16.3 % — ABNORMAL HIGH (ref 11.5–15.5)
WBC: 5.4 10*3/uL (ref 4.0–10.5)
nRBC: 0 % (ref 0.0–0.2)

## 2021-06-08 LAB — COMPREHENSIVE METABOLIC PANEL
ALT: <5 U/L (ref 0–44)
AST: 8 U/L — ABNORMAL LOW (ref 15–41)
Albumin: 2.7 g/dL — ABNORMAL LOW (ref 3.5–5.0)
Alkaline Phosphatase: 90 U/L (ref 38–126)
Anion gap: 8 (ref 5–15)
BUN: 39 mg/dL — ABNORMAL HIGH (ref 8–23)
CO2: 28 mmol/L (ref 22–32)
Calcium: 7.9 mg/dL — ABNORMAL LOW (ref 8.9–10.3)
Chloride: 97 mmol/L — ABNORMAL LOW (ref 98–111)
Creatinine, Ser: 5.71 mg/dL — ABNORMAL HIGH (ref 0.44–1.00)
GFR, Estimated: 7 mL/min — ABNORMAL LOW (ref >60)
Glucose, Bld: 105 mg/dL — ABNORMAL HIGH (ref 70–99)
Potassium: 4.5 mmol/L (ref 3.5–5.1)
Sodium: 133 mmol/L — ABNORMAL LOW (ref 135–145)
Total Bilirubin: 0.7 mg/dL (ref 0.3–1.2)
Total Protein: 7.9 g/dL (ref 6.5–8.1)

## 2021-06-08 LAB — LIPASE, BLOOD: Lipase: 30 U/L (ref 11–51)

## 2021-06-08 MED ORDER — FAMOTIDINE 20 MG PO TABS: 20.0000 mg | ORAL_TABLET | Freq: Two times a day (BID) | ORAL | 0 refills | Status: DC

## 2021-06-08 MED ORDER — ONDANSETRON 4 MG PO TBDP
4.0000 mg | ORAL_TABLET | Freq: Three times a day (TID) | ORAL | 0 refills | Status: DC | PRN
Start: 2021-06-08 — End: 2021-08-09

## 2021-06-08 MED ORDER — ONDANSETRON HCL 4 MG/2ML IJ SOLN: 4.0000 mg | Freq: Once | INTRAMUSCULAR | Status: DC

## 2021-06-08 MED ORDER — DOXYCYCLINE HYCLATE 100 MG PO CAPS: 100.0000 mg | ORAL_CAPSULE | Freq: Two times a day (BID) | ORAL | 0 refills | Status: DC

## 2021-06-08 NOTE — ED Provider Notes (Signed)
[facility] Emergency Department Provider Note  ____________________________________________  Time seen: Approximately 9:32 AM  I have reviewed the triage vital signs and the nursing notes.   HISTORY  Chief Complaint Covid Positive and Weakness    HPI [name] is a 87 y.o. female with a history of CAD, ESRD on hemodialysis Monday Wednesday Friday, hypertension, diabetes, diagnosed with COVID yesterday who complains of generalized weakness and malaise for the past 2 days.  She was prescribed Tessalon and molnupiravir yesterday which she is taking.  Denies shortness of breath or chest pain.  Last went to dialysis 2 days ago on Monday.  She is scheduled to go today, but unsure where she should go due to being temporarily reassigned to a COVID designated dialysis center.  She was told to come to the ED today by her primary care office for evaluation.  She reports eating and drinking normally.  No dizziness or passing out.  Past Medical History:  Diagnosis Date  . Arthritis   . Bilateral Macular degeneration   . Breast cancer ([place]) 2012   right breast  . Breast mass, right   . CAD S/P CABG x 4    a. 09/2012 CABG x 4: LIMA to LAD, SVG to Diag, SVG to OM1, SVG to PDA, EVH from bilateral thighs  . Cancer [facility])    breast cancer, right side 2012  . Complication of anesthesia   . Coronary artery disease 08/22/2012   sees Dr Rockey Situ  . Diabetes ([place])   . Diastolic dysfunction    a. 06/2020 Echo: EF 60-65%; b. 08/2020 Echo: EF 60-65%, no rwma, Gr1 DD. Nl RV size/fxn.  . DOE (dyspnea on exertion)   . ESRD (end stage renal disease) ([place])   . Gastritis    hx of  . GERD (gastroesophageal reflux disease)   . Headache(784.0)    migraines  . History of breast cancer    39 treatments of radiation. Negative chemo.  Marland Kitchen History of seasonal allergies   . Hyperlipemia   . Hypertension    sees Dr. Fulton Reek  . Hypothyroidism   . Neuropathy   . Personal  history of radiation therapy   . Pneumonia    hx of  . PONV (postoperative nausea and vomiting)   . Stroke ([place])    2012  . Vertigo      Patient Active Problem List   Diagnosis Date Noted  . Pulmonary edema 05/21/2021  . Acute diastolic CHF (congestive heart failure) ([place]) 05/21/2021  . Macular degeneration   . Swelling of limb 03/29/2021  . Lower limb ulcer, ankle, right, limited to breakdown of skin ([place]) 03/08/2021  . Bacteremia due to Staphylococcus 08/11/2020  . Pressure injury of skin 08/05/2020  . Wound infection 08/04/2020  . History of CVA with residual deficit 08/04/2020  . Ambulatory dysfunction 08/04/2020  . Anemia of chronic kidney failure, stage 5 ([place]) 08/04/2020  . ESRD on hemodialysis ([place]) 07/16/2020  . ESRD (end stage renal disease) on dialysis ([place]) 07/14/2020  . Symptomatic anemia 07/01/2020  . Hyperkalemia 06/22/2020  . Prolonged QT interval 06/22/2020  . Stroke ([place]) 06/01/2020  . CAD (coronary artery disease) 06/01/2020  . Thyroid disease 06/01/2020  . CKD (chronic kidney disease) stage 5, GFR less than 15 ml/min (HCC) 06/01/2020  . Carotid stenosis 06/01/2020  . Pain syndrome, chronic 02/16/2020  . Weakness 08/30/2016  . Acute renal failure (ARF) ([place]) 05/04/2016  . Type 2 diabetes with nephropathy ([place]) 09/02/2015  .  HTN, goal below 140/80 08/20/2015  . Diabetes mellitus type 2, uncomplicated ([place]) 18/84/1660  . Cerebral embolism with cerebral infarction ([place]) 09/07/2012  . Hemiplegia, unspecified, affecting dominant side 09/07/2012  . 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 Coronary artery disease 08/22/2012  . Hyperlipidemia 08/19/2012  . Diabetes mellitus ([place]) 08/19/2012  . Hypertension 08/19/2012  . Breast cancer ([place]) 08/19/2012  . GERD (gastroesophageal reflux disease) 08/19/2012     Past Surgical History:  Procedure Laterality Date  . A/V FISTULAGRAM Left 01/13/2021   Procedure: A/V FISTULAGRAM;  Surgeon: Algernon Huxley, MD;  Location: [place] [facility];  Service: Cardiovascular;  Laterality: Left;  . A/V SHUNT INTERVENTION Left 05/23/2021   Procedure: A/V SHUNT INTERVENTION;  Surgeon: Algernon Huxley, MD;  Location: [place] [facility];  Service: Cardiovascular;  Laterality: Left;  . ABDOMINAL HYSTERECTOMY    . APPENDECTOMY    . AV FISTULA PLACEMENT Left 11/04/2020   Procedure: ARTERIOVENOUS (AV) FISTULA CREATION (BRACHIOCEPHALIC);  Surgeon: Algernon Huxley, MD;  Location: [facility];  Service: Vascular;  Laterality: Left;  . BACK SURGERY    . BREAST BIOPSY Right    2012 positive- [facility]  . BREAST LUMPECTOMY Right 2012   F/U radiation   . BREAST SURGERY    . CARDIAC CATHETERIZATION  2014  . CAROTID PTA/STENT INTERVENTION Left 06/14/2020   Procedure: CAROTID PTA/STENT INTERVENTION;  Surgeon: Algernon Huxley, MD;  Location: [place] [facility];  Service: Cardiovascular;  Laterality: Left;  . CATARACT EXTRACTION W/PHACO Right 08/14/2017   Procedure: CATARACT EXTRACTION PHACO AND INTRAOCULAR LENS PLACEMENT (IOC);  Surgeon: Birder Robson, MD;  Location: [facility];  Service: Ophthalmology;  Laterality: Right;  [place] 00:43.0 AP% 17.1 CDE 7.37 Fluid Pack lot # [id number]  . CATARACT EXTRACTION W/PHACO Left 10/09/2017   Procedure: CATARACT EXTRACTION PHACO AND INTRAOCULAR LENS PLACEMENT ([place]);  Surgeon: Birder Robson, MD;  Location: [facility];  Service: Ophthalmology;  Laterality: Left;  [place] 00:30.3 AP% 11.0 CDE 3.33 Fluid Pack Lot # [id number]  . CORONARY ARTERY BYPASS GRAFT  09/05/2012   Procedure: CORONARY ARTERY BYPASS GRAFTING (CABG);  Surgeon: Rexene Alberts, MD;  Location: [place];  Service: Open Heart Surgery;  Laterality: N/A;  CABG x four, using left internal mammary artery and bilateral greater saphenous vein harvested endoscopically  . DIALYSIS/PERMA CATHETER INSERTION Left 06/25/2020   Procedure: DIALYSIS/PERMA CATHETER INSERTION;  Surgeon: Algernon Huxley, MD;  Location: [place] [facility];  Service: Cardiovascular;  Laterality: Left;  .  DIALYSIS/PERMA CATHETER INSERTION N/A 08/10/2020   Procedure: DIALYSIS/PERMA CATHETER INSERTION;  Surgeon: Katha Cabal, MD;  Location: [place] [facility];  Service: Cardiovascular;  Laterality: N/A;  . DIALYSIS/PERMA CATHETER INSERTION N/A 05/23/2021   Procedure: DIALYSIS/PERMA CATHETER INSERTION;  Surgeon: Algernon Huxley, MD;  Location: [place] [facility];  Service: Cardiovascular;  Laterality: N/A;  . DIALYSIS/PERMA CATHETER REMOVAL N/A 08/05/2020   Procedure: DIALYSIS/PERMA CATHETER REMOVAL;  Surgeon: Algernon Huxley, MD;  Location: [place] [facility];  Service: Cardiovascular;  Laterality: N/A;  . INTRAOPERATIVE TRANSESOPHAGEAL ECHOCARDIOGRAM  09/05/2012   Procedure: INTRAOPERATIVE TRANSESOPHAGEAL ECHOCARDIOGRAM;  Surgeon: Rexene Alberts, MD;  Location: [place];  Service: Open Heart Surgery;  Laterality: N/A;  . KNEE SURGERY     Bilateral nerve block  . LAPAROSCOPIC NISSEN FUNDOPLICATION    . OVARIAN CYST REMOVAL       Prior to Admission medications   Medication Sig Start Date End Date Taking? Authorizing Provider  famotidine (PEPCID) 20  MG tablet Take 1 tablet (20 mg total) by mouth 2 (two) times daily for 10 days. 06/08/21 06/18/21 Yes Carrie Mew, MD  ondansetron (ZOFRAN ODT) 4 MG disintegrating tablet Take 1 tablet (4 mg total) by mouth every 8 (eight) hours as needed for nausea or vomiting. 06/08/21  Yes Carrie Mew, MD  ALPRAZolam Duanne Moron) 0.5 MG tablet Take 0.5 mg by mouth 2 (two) times daily as needed for anxiety.     [provider]  ALPRAZolam Duanne Moron) 0.5 MG tablet Take 1 tablet by mouth 2 (two) times daily as needed. 05/26/21   [provider]  aspirin 81 MG chewable tablet Chew 81 mg by mouth every Monday, Wednesday, and Friday.     [provider]  calcium-vitamin D (OSCAL WITH D) 500-200 MG-UNIT tablet Take 1 tablet by mouth daily.    [provider]  ceFEPime (MAXIPIME) IVPB Inject 2 g into the vein every Monday, Wednesday, and  Friday with hemodialysis for 14 days. Labs weekly while on IV antibiotics: _X_ CBC with differential   _X_ CMP   Blood culture on 06/15/21   Fax weekly labs to [phone]     Call [phone] with any questions 05/25/21 06/08/21  Fritzi Mandes, MD  clopidogrel (PLAVIX) 75 MG tablet Take 75 mg by mouth daily.    [provider]  Cyanocobalamin 1000 MCG SUBL Take 1,000 mcg by mouth daily.     [provider]  gabapentin (NEURONTIN) 100 MG capsule Take 1 capsule by mouth 2 (two) times daily. 04/11/21   [provider]  hydrALAZINE (APRESOLINE) 25 MG tablet Take 25 mg by mouth 3 (three) times daily.    [provider]  HYDROcodone-acetaminophen (NORCO/VICODIN) 5-325 MG tablet Take 1 tablet by mouth every 6 (six) hours as needed for moderate pain. 11/04/20   Algernon Huxley, MD  levothyroxine (SYNTHROID) 150 MCG tablet Take by mouth. 04/19/21   [provider]  metoprolol tartrate (LOPRESSOR) 50 MG tablet Take 50 mg by mouth in the morning, at noon, and at bedtime.    [provider]  neomycin-bacitracin-polymyxin (NEOSPORIN) ointment Apply topically 2 (two) times daily. 05/25/21   Fritzi Mandes, MD  OXYGEN Inhale 2 L into the lungs daily.    [provider]  torsemide (DEMADEX) 100 MG tablet Take 1 tablet (100 mg total) by mouth 3 (three) times a week. On Non dialysis days 05/25/21   Fritzi Mandes, MD     Allergies Clonidine, Darvocet [propoxyphene n-acetaminophen], Hydralazine, Hydralazine hcl, Esomeprazole, Guaifenesin, Phenylephrine-guaifenesin, Rabeprazole, Telithromycin, Fluocinolone, Iodinated diagnostic agents, Levaquin [levofloxacin], and Statins   Family History  Problem Relation Age of Onset  . Heart disease Brother        CABG & stents  . Hyperlipidemia Brother   . Hypertension Brother   . Cancer Father   . Heart disease Mother   . Breast cancer Cousin   . Kidney disease Neg Hx     Social History Social History    Tobacco Use  . Smoking status: Former    Years: 3.00    Types: Cigarettes    Quit date: 11/13/1988    Years since quitting: 32.5  . Smokeless tobacco: Never  . Tobacco comments:    At the most 1 pack would last over a week.  Vaping Use  . Vaping Use: Never used  Substance Use Topics  . Alcohol use: No  . Drug use: No    Review of Systems  Constitutional:   No fever or  chills.  ENT:   No sore throat. No rhinorrhea. Cardiovascular:   No chest pain or syncope. Respiratory:   No dyspnea or cough. Gastrointestinal:   Negative for abdominal pain, vomiting and diarrhea.  Musculoskeletal:   Negative for focal pain or swelling All other systems reviewed and are negative except as documented above in ROS and HPI.  ____________________________________________   PHYSICAL EXAM:  VITAL SIGNS: ED Triage Vitals  Enc Vitals Group     BP 06/08/21 0805 (!) 143/64     Pulse Rate 06/08/21 0805 81     Resp 06/08/21 0805 20     Temp 06/08/21 0805 98 F (36.7 C)     Temp Source 06/08/21 0805 Oral     SpO2 06/08/21 0800 96 %     Weight 06/08/21 0806 180 lb (81.6 kg)     Height 06/08/21 0806 5\' 4"  (1.626 m)     Head Circumference --      Peak Flow --      Pain Score 06/08/21 0806 0     Pain Loc --      Pain Edu? --      Excl. in [place]? --     Vital signs reviewed, nursing assessments reviewed.   Constitutional:   Alert and oriented. Non-toxic appearance. Eyes:   Conjunctivae are normal. EOMI. PERRL. ENT      Head:   Normocephalic and atraumatic.      Nose:   Wearing a mask.      Mouth/Throat:   Wearing a mask.      Neck:   No meningismus. Full ROM. Hematological/Lymphatic/Immunilogical:   No cervical lymphadenopathy. Cardiovascular:   RRR. Symmetric bilateral radial and DP pulses.  No murmurs. Cap refill less than 2 seconds.  Left upper extremity AV fistula with strong thrill Respiratory:   Normal respiratory effort without tachypnea/retractions. Breath sounds are clear and equal  bilaterally. No wheezes/rales/rhonchi. Gastrointestinal:   Soft and nontender. Non distended. There is no CVA tenderness.  No rebound, rigidity, or guarding. Genitourinary:   deferred Musculoskeletal:   Normal range of motion in all extremities. No joint effusions.  No lower extremity tenderness.  No edema. Neurologic:   Normal speech and language.  Motor grossly intact. No acute focal neurologic deficits are appreciated.  Skin:    Skin is warm, dry and intact. No rash noted.  No petechiae, purpura, or bullae.  ____________________________________________    LABS (pertinent positives/negatives) (all labs ordered are listed, but only abnormal results are displayed) Labs Reviewed  COMPREHENSIVE METABOLIC PANEL - Abnormal; Notable for the following components:      Result Value   Sodium 133 (*)    Chloride 97 (*)    Glucose, Bld 105 (*)    BUN 39 (*)    Creatinine, Ser 5.71 (*)    Calcium 7.9 (*)    Albumin 2.7 (*)    AST 8 (*)    GFR, Estimated 7 (*)    All other components within normal limits  CBC WITH DIFFERENTIAL/PLATELET - Abnormal; Notable for the following components:   RBC 3.03 (*)    Hemoglobin 9.1 (*)    HCT 30.6 (*)    MCV 101.0 (*)    MCHC 29.7 (*)    RDW 16.3 (*)    Platelets 149 (*)    All other components within normal limits  LIPASE, BLOOD   ____________________________________________   EKG  Interpreted by me Normal sinus rhythm rate of 81.  Normal axis and intervals.  Poor R wave progression.  Normal ST segments and T waves.  QTc 440 ms  ____________________________________________    RADIOLOGY  DG Chest Portable 1 View  Result Date: 06/08/2021 CLINICAL DATA:  COVID-19 positive.  Cough. EXAM: PORTABLE CHEST 1 VIEW COMPARISON:  May 21, 2021. FINDINGS: Stable cardiomegaly. Status post coronary artery bypass graft. No pneumothorax is noted. Stable left basilar opacity is noted concerning for atelectasis or infiltrate with associated pleural  effusion. Mildly increased right midlung opacity is noted concerning for worsening atelectasis or possibly infiltrate. Bony thorax is unremarkable. IMPRESSION: Stable left basilar opacity is noted as described above. Mildly increased right midlung opacity is noted concerning for worsening atelectasis or possibly infiltrate. Electronically Signed   By: Marijo Conception M.D.   On: 06/08/2021 09:09    ____________________________________________   PROCEDURES Procedures  ____________________________________________    CLINICAL IMPRESSION / ASSESSMENT AND PLAN / ED COURSE  Medications ordered in the ED: Medications  ondansetron (ZOFRAN) injection 4 mg (4 mg Intravenous Patient Refused/Not Given 06/08/21 0856)    Pertinent labs & imaging results that were available during my care of the patient were reviewed by me and considered in my medical decision making (see chart for details).  [name] was evaluated in Emergency Department on 06/08/2021 for the symptoms described in the history of present illness. She was evaluated in the context of the global COVID-19 pandemic, which necessitated consideration that the patient might be at risk for infection with the SARS-CoV-2 virus that causes COVID-19. Institutional protocols and algorithms that pertain to the evaluation of patients at risk for COVID-19 are in a state of rapid change based on information released by regulatory bodies including the CDC and federal and state organizations. These policies and algorithms were followed during the patient's care in the ED.   Patient presents with cough and known COVID infection.  Has constitutional symptoms but no focal chest pain or shortness of breath.  Chest x-ray does show possible right-sided infiltrate although this is minor and vital signs are stable.  Oxygenation is stable on her usual 4 L nasal cannula.  Will verify with nephrology which dialysis center patient can access, and I believe patient is  stable for discharge home with reassuring labs, vitals and EKG.  Will provide pulmonary antibiotic coverage due to baseline poor state of health.  Clinical Course as of 06/08/21 1005  Wed Jun 08, 2021  1005 Dr. Zollie Scale reports that [facility] is working patient in for second shift dialysis session today. [PS]    Clinical Course User Index [PS] Carrie Mew, MD     ____________________________________________   FINAL CLINICAL IMPRESSION(S) / ED DIAGNOSES    Final diagnoses:  Malaise and fatigue  COVID-19 virus infection  ESRD on hemodialysis [facility])     ED Discharge Orders          Ordered    ondansetron (ZOFRAN ODT) 4 MG disintegrating tablet  Every 8 hours PRN        06/08/21 0932    famotidine (PEPCID) 20 MG tablet  2 times daily        06/08/21 0932            Portions of this note were generated with dragon dictation software. Dictation errors may occur despite best attempts at proofreading.    Carrie Mew, MD 06/08/21 [phone]

## 2021-06-08 NOTE — ED Notes (Signed)
Verbalized understanding discharge instructions, prescriptions, follow-up, and dialysis appointment. In no acute distress.

## 2021-06-08 NOTE — Discharge Instructions (Addendum)
Please follow up with the [facility] dialysis center today for your treatment. They are planning to work you into the second shift today, so please expect a call from them soon.

## 2021-06-08 NOTE — ED Notes (Signed)
X-ray at bedside

## 2021-06-08 NOTE — ED Notes (Addendum)
Pt c/o fatigue and congested cough.  Pt sts she has been sleeping more than normal.  Denies pain and n/v/d.

## 2021-06-08 NOTE — ED Triage Notes (Signed)
Per EMS, Pt, from home, c/o weakness x "a couple days."  Pt was diagnosed w/ COVID yesterday.  Congested cough noted.  Denies fever and pain.  Hx of CAD, HTN, DM, and dialysis M/W/F.    Pt is on 4L [place] at home.   Pt was prescribed Tessalon and Lageviro yesterday.

## 2021-06-09 ENCOUNTER — Other Ambulatory Visit: Payer: Self-pay

## 2021-06-09 ENCOUNTER — Inpatient Hospital Stay
Admission: EM | Admit: 2021-06-09 | Discharge: 2021-06-20 | DRG: 177 | Disposition: A | Discharge status: Skilled Nursing Facility | Payer: Medicare Other | Attending: Internal Medicine | Admitting: Internal Medicine

## 2021-06-09 DIAGNOSIS — Z91041 Radiographic dye allergy status: Secondary | ICD-10-CM

## 2021-06-09 DIAGNOSIS — N185 Chronic kidney disease, stage 5: Secondary | ICD-10-CM | POA: Diagnosis not present

## 2021-06-09 DIAGNOSIS — E1121 Type 2 diabetes mellitus with diabetic nephropathy: Secondary | ICD-10-CM | POA: Diagnosis present

## 2021-06-09 DIAGNOSIS — Z90710 Acquired absence of both cervix and uterus: Secondary | ICD-10-CM

## 2021-06-09 DIAGNOSIS — L89626 Pressure-induced deep tissue damage of left heel: Secondary | ICD-10-CM | POA: Diagnosis present

## 2021-06-09 DIAGNOSIS — Z9842 Cataract extraction status, left eye: Secondary | ICD-10-CM

## 2021-06-09 DIAGNOSIS — I440 Atrioventricular block, first degree: Secondary | ICD-10-CM | POA: Diagnosis present

## 2021-06-09 DIAGNOSIS — C50919 Malignant neoplasm of unspecified site of unspecified female breast: Secondary | ICD-10-CM | POA: Diagnosis present

## 2021-06-09 DIAGNOSIS — Z79899 Other long term (current) drug therapy: Secondary | ICD-10-CM

## 2021-06-09 DIAGNOSIS — N186 End stage renal disease: Secondary | ICD-10-CM | POA: Diagnosis present

## 2021-06-09 DIAGNOSIS — Z7902 Long term (current) use of antithrombotics/antiplatelets: Secondary | ICD-10-CM

## 2021-06-09 DIAGNOSIS — Z7982 Long term (current) use of aspirin: Secondary | ICD-10-CM

## 2021-06-09 DIAGNOSIS — E1151 Type 2 diabetes mellitus with diabetic peripheral angiopathy without gangrene: Secondary | ICD-10-CM | POA: Diagnosis present

## 2021-06-09 DIAGNOSIS — I10 Essential (primary) hypertension: Secondary | ICD-10-CM

## 2021-06-09 DIAGNOSIS — Z9841 Cataract extraction status, right eye: Secondary | ICD-10-CM

## 2021-06-09 DIAGNOSIS — E785 Hyperlipidemia, unspecified: Secondary | ICD-10-CM | POA: Diagnosis present

## 2021-06-09 DIAGNOSIS — U071 COVID-19: Principal | ICD-10-CM | POA: Diagnosis present

## 2021-06-09 DIAGNOSIS — Z8249 Family history of ischemic heart disease and other diseases of the circulatory system: Secondary | ICD-10-CM

## 2021-06-09 DIAGNOSIS — Z66 Do not resuscitate: Secondary | ICD-10-CM | POA: Diagnosis present

## 2021-06-09 DIAGNOSIS — J1282 Pneumonia due to coronavirus disease 2019: Secondary | ICD-10-CM | POA: Diagnosis present

## 2021-06-09 DIAGNOSIS — I2510 Atherosclerotic heart disease of native coronary artery without angina pectoris: Secondary | ICD-10-CM | POA: Diagnosis present

## 2021-06-09 DIAGNOSIS — E782 Mixed hyperlipidemia: Secondary | ICD-10-CM

## 2021-06-09 DIAGNOSIS — Z853 Personal history of malignant neoplasm of breast: Secondary | ICD-10-CM

## 2021-06-09 DIAGNOSIS — Y92009 Unspecified place in unspecified non-institutional (private) residence as the place of occurrence of the external cause: Secondary | ICD-10-CM

## 2021-06-09 DIAGNOSIS — Z87891 Personal history of nicotine dependence: Secondary | ICD-10-CM

## 2021-06-09 DIAGNOSIS — I5042 Chronic combined systolic (congestive) and diastolic (congestive) heart failure: Secondary | ICD-10-CM | POA: Diagnosis present

## 2021-06-09 DIAGNOSIS — Z888 Allergy status to other drugs, medicaments and biological substances status: Secondary | ICD-10-CM

## 2021-06-09 DIAGNOSIS — F419 Anxiety disorder, unspecified: Secondary | ICD-10-CM | POA: Diagnosis present

## 2021-06-09 DIAGNOSIS — R531 Weakness: Secondary | ICD-10-CM

## 2021-06-09 DIAGNOSIS — R262 Difficulty in walking, not elsewhere classified: Secondary | ICD-10-CM | POA: Diagnosis present

## 2021-06-09 DIAGNOSIS — Z9981 Dependence on supplemental oxygen: Secondary | ICD-10-CM

## 2021-06-09 DIAGNOSIS — Z79890 Hormone replacement therapy (postmenopausal): Secondary | ICD-10-CM

## 2021-06-09 DIAGNOSIS — Z961 Presence of intraocular lens: Secondary | ICD-10-CM | POA: Diagnosis present

## 2021-06-09 DIAGNOSIS — I959 Hypotension, unspecified: Secondary | ICD-10-CM | POA: Diagnosis not present

## 2021-06-09 DIAGNOSIS — Z951 Presence of aortocoronary bypass graft: Secondary | ICD-10-CM

## 2021-06-09 DIAGNOSIS — E11622 Type 2 diabetes mellitus with other skin ulcer: Secondary | ICD-10-CM | POA: Diagnosis present

## 2021-06-09 DIAGNOSIS — Z923 Personal history of irradiation: Secondary | ICD-10-CM

## 2021-06-09 DIAGNOSIS — L97809 Non-pressure chronic ulcer of other part of unspecified lower leg with unspecified severity: Secondary | ICD-10-CM | POA: Diagnosis present

## 2021-06-09 DIAGNOSIS — I132 Hypertensive heart and chronic kidney disease with heart failure and with stage 5 chronic kidney disease, or end stage renal disease: Secondary | ICD-10-CM | POA: Diagnosis present

## 2021-06-09 DIAGNOSIS — H9190 Unspecified hearing loss, unspecified ear: Secondary | ICD-10-CM | POA: Diagnosis present

## 2021-06-09 DIAGNOSIS — Z6832 Body mass index (BMI) 32.0-32.9, adult: Secondary | ICD-10-CM

## 2021-06-09 DIAGNOSIS — E039 Hypothyroidism, unspecified: Secondary | ICD-10-CM | POA: Diagnosis present

## 2021-06-09 DIAGNOSIS — G629 Polyneuropathy, unspecified: Secondary | ICD-10-CM

## 2021-06-09 DIAGNOSIS — W19XXXA Unspecified fall, initial encounter: Secondary | ICD-10-CM | POA: Diagnosis present

## 2021-06-09 DIAGNOSIS — Z8673 Personal history of transient ischemic attack (TIA), and cerebral infarction without residual deficits: Secondary | ICD-10-CM

## 2021-06-09 DIAGNOSIS — L89151 Pressure ulcer of sacral region, stage 1: Secondary | ICD-10-CM | POA: Diagnosis present

## 2021-06-09 DIAGNOSIS — E1140 Type 2 diabetes mellitus with diabetic neuropathy, unspecified: Secondary | ICD-10-CM | POA: Diagnosis present

## 2021-06-09 DIAGNOSIS — J9621 Acute and chronic respiratory failure with hypoxia: Secondary | ICD-10-CM

## 2021-06-09 DIAGNOSIS — L89301 Pressure ulcer of unspecified buttock, stage 1: Secondary | ICD-10-CM | POA: Diagnosis present

## 2021-06-09 DIAGNOSIS — I739 Peripheral vascular disease, unspecified: Secondary | ICD-10-CM

## 2021-06-09 DIAGNOSIS — E1122 Type 2 diabetes mellitus with diabetic chronic kidney disease: Secondary | ICD-10-CM | POA: Diagnosis present

## 2021-06-09 DIAGNOSIS — G894 Chronic pain syndrome: Secondary | ICD-10-CM | POA: Diagnosis present

## 2021-06-09 DIAGNOSIS — G9341 Metabolic encephalopathy: Secondary | ICD-10-CM | POA: Diagnosis not present

## 2021-06-09 DIAGNOSIS — Z7984 Long term (current) use of oral hypoglycemic drugs: Secondary | ICD-10-CM

## 2021-06-09 DIAGNOSIS — Z83438 Family history of other disorder of lipoprotein metabolism and other lipidemia: Secondary | ICD-10-CM

## 2021-06-09 DIAGNOSIS — N2581 Secondary hyperparathyroidism of renal origin: Secondary | ICD-10-CM | POA: Diagnosis present

## 2021-06-09 DIAGNOSIS — K219 Gastro-esophageal reflux disease without esophagitis: Secondary | ICD-10-CM | POA: Diagnosis present

## 2021-06-09 DIAGNOSIS — R7881 Bacteremia: Secondary | ICD-10-CM | POA: Diagnosis present

## 2021-06-09 DIAGNOSIS — H3530 Unspecified macular degeneration: Secondary | ICD-10-CM | POA: Diagnosis present

## 2021-06-09 DIAGNOSIS — J9611 Chronic respiratory failure with hypoxia: Secondary | ICD-10-CM

## 2021-06-09 DIAGNOSIS — Z992 Dependence on renal dialysis: Secondary | ICD-10-CM

## 2021-06-09 DIAGNOSIS — D631 Anemia in chronic kidney disease: Secondary | ICD-10-CM | POA: Diagnosis present

## 2021-06-09 DIAGNOSIS — G8929 Other chronic pain: Secondary | ICD-10-CM

## 2021-06-09 DIAGNOSIS — Z803 Family history of malignant neoplasm of breast: Secondary | ICD-10-CM

## 2021-06-09 DIAGNOSIS — E669 Obesity, unspecified: Secondary | ICD-10-CM | POA: Diagnosis present

## 2021-06-09 LAB — BASIC METABOLIC PANEL
Anion gap: 11 (ref 5–15)
BUN: 38 mg/dL — ABNORMAL HIGH (ref 8–23)
CO2: 24 mmol/L (ref 22–32)
Calcium: 8.1 mg/dL — ABNORMAL LOW (ref 8.9–10.3)
Chloride: 98 mmol/L (ref 98–111)
Creatinine, Ser: 5.24 mg/dL — ABNORMAL HIGH (ref 0.44–1.00)
GFR, Estimated: 8 mL/min — ABNORMAL LOW (ref >60)
Glucose, Bld: 85 mg/dL (ref 70–99)
Potassium: 4.3 mmol/L (ref 3.5–5.1)
Sodium: 133 mmol/L — ABNORMAL LOW (ref 135–145)

## 2021-06-09 LAB — CBC
HCT: 34.2 % — ABNORMAL LOW (ref 36.0–46.0)
Hemoglobin: 10 g/dL — ABNORMAL LOW (ref 12.0–15.0)
MCH: 29.9 pg (ref 26.0–34.0)
MCHC: 29.2 g/dL — ABNORMAL LOW (ref 30.0–36.0)
MCV: 102.1 fL — ABNORMAL HIGH (ref 80.0–100.0)
Platelets: 149 10*3/uL — ABNORMAL LOW (ref 150–400)
RBC: 3.35 MIL/uL — ABNORMAL LOW (ref 3.87–5.11)
RDW: 16.8 % — ABNORMAL HIGH (ref 11.5–15.5)
WBC: 6 10*3/uL (ref 4.0–10.5)
nRBC: 0 % (ref 0.0–0.2)

## 2021-06-09 LAB — GLUCOSE, CAPILLARY: Glucose-Capillary: 108 mg/dL — ABNORMAL HIGH (ref 70–99)

## 2021-06-09 MED ORDER — LEVOTHYROXINE SODIUM 50 MCG PO TABS
150.0000 ug | ORAL_TABLET | Freq: Every day | ORAL | Status: DC
  Administered 2021-06-10 – 2021-06-20 (×11): 150 ug via ORAL
  Filled 2021-06-09 (×12): qty 1

## 2021-06-09 MED ORDER — HYDROCODONE-ACETAMINOPHEN 5-325 MG PO TABS
1.0000 | ORAL_TABLET | Freq: Four times a day (QID) | ORAL | Status: DC | PRN
  Administered 2021-06-10 – 2021-06-16 (×9): 1 via ORAL
  Filled 2021-06-09 (×10): qty 1

## 2021-06-09 MED ORDER — INSULIN ASPART 100 UNIT/ML IJ SOLN: 0.0000 [IU] | Freq: Every day | INTRAMUSCULAR | Status: DC

## 2021-06-09 MED ORDER — ONDANSETRON HCL 4 MG PO TABS
4.0000 mg | ORAL_TABLET | Freq: Four times a day (QID) | ORAL | Status: AC | PRN
Start: 2021-06-09 — End: 2021-06-19
  Administered 2021-06-11 – 2021-06-12 (×2): 4 mg via ORAL
  Filled 2021-06-09 (×2): qty 1

## 2021-06-09 MED ORDER — SODIUM CHLORIDE 0.9 % IV SOLN
2.0000 g | INTRAVENOUS | Status: DC
  Filled 2021-06-09: qty 2

## 2021-06-09 MED ORDER — FAMOTIDINE 20 MG PO TABS
20.0000 mg | ORAL_TABLET | Freq: Two times a day (BID) | ORAL | Status: DC
  Administered 2021-06-09 – 2021-06-10 (×3): 20 mg via ORAL
  Filled 2021-06-09 (×3): qty 1

## 2021-06-09 MED ORDER — SODIUM CHLORIDE 0.9 % IV SOLN
200.0000 mg | Freq: Once | INTRAVENOUS | Status: AC
  Administered 2021-06-09: 200 mg via INTRAVENOUS
  Filled 2021-06-09: qty 200

## 2021-06-09 MED ORDER — ASPIRIN 81 MG PO CHEW
81.0000 mg | CHEWABLE_TABLET | ORAL | Status: DC
  Administered 2021-06-10 – 2021-06-20 (×5): 81 mg via ORAL
  Filled 2021-06-09 (×9): qty 1

## 2021-06-09 MED ORDER — ACETAMINOPHEN 325 MG PO TABS
650.0000 mg | ORAL_TABLET | Freq: Four times a day (QID) | ORAL | Status: AC | PRN
  Filled 2021-06-09: qty 2

## 2021-06-09 MED ORDER — TORSEMIDE 20 MG PO TABS
100.0000 mg | ORAL_TABLET | ORAL | Status: DC
  Administered 2021-06-11 – 2021-06-18 (×4): 100 mg via ORAL
  Filled 2021-06-09 (×5): qty 5

## 2021-06-09 MED ORDER — HYDRALAZINE HCL 50 MG PO TABS
25.0000 mg | ORAL_TABLET | Freq: Three times a day (TID) | ORAL | Status: DC
  Administered 2021-06-09 – 2021-06-16 (×16): 25 mg via ORAL
  Filled 2021-06-09 (×17): qty 1

## 2021-06-09 MED ORDER — HEPARIN SODIUM (PORCINE) 5000 UNIT/ML IJ SOLN
5000.0000 [IU] | Freq: Three times a day (TID) | INTRAMUSCULAR | Status: DC
  Administered 2021-06-09 – 2021-06-20 (×31): 5000 [IU] via SUBCUTANEOUS
  Filled 2021-06-09 (×31): qty 1

## 2021-06-09 MED ORDER — CLOPIDOGREL BISULFATE 75 MG PO TABS
75.0000 mg | ORAL_TABLET | Freq: Every day | ORAL | Status: DC
  Administered 2021-06-10 – 2021-06-20 (×11): 75 mg via ORAL
  Filled 2021-06-09 (×11): qty 1

## 2021-06-09 MED ORDER — INSULIN ASPART 100 UNIT/ML IJ SOLN
0.0000 [IU] | Freq: Three times a day (TID) | INTRAMUSCULAR | Status: DC
  Filled 2021-06-09: qty 1

## 2021-06-09 MED ORDER — ONDANSETRON HCL 4 MG/2ML IJ SOLN
4.0000 mg | Freq: Four times a day (QID) | INTRAMUSCULAR | Status: AC | PRN
Start: 2021-06-09 — End: 2021-06-19
  Administered 2021-06-19: 4 mg via INTRAVENOUS
  Filled 2021-06-09: qty 2

## 2021-06-09 MED ORDER — HYDROCOD POLST-CPM POLST ER 10-8 MG/5ML PO SUER
5.0000 mL | Freq: Every evening | ORAL | Status: AC | PRN
  Administered 2021-06-10 – 2021-06-13 (×3): 5 mL via ORAL
  Filled 2021-06-09 (×3): qty 5

## 2021-06-09 MED ORDER — ALPRAZOLAM 0.5 MG PO TABS
0.5000 mg | ORAL_TABLET | Freq: Two times a day (BID) | ORAL | Status: DC | PRN
  Administered 2021-06-09 – 2021-06-19 (×9): 0.5 mg via ORAL
  Filled 2021-06-09 (×11): qty 1

## 2021-06-09 MED ORDER — SODIUM CHLORIDE 0.9 % IV SOLN
100.0000 mg | Freq: Every day | INTRAVENOUS | Status: DC
  Filled 2021-06-09: qty 20

## 2021-06-09 NOTE — Progress Notes (Signed)
Remdesivir - Pharmacy Brief Note   O:  ALT: WNL CXR: "Stable left basilar opacity is noted as described above. Mildly increased right midlung opacity is noted concerning for worsening atelectasis or possibly infiltrate." SpO2: 99% on [place] 2 L   A/P:  Remdesivir 200 mg IVPB once followed by 100 mg IVPB daily x 4 days.   Wynelle Cleveland, PharmD Pharmacy Resident  06/09/2021 2:42 PM

## 2021-06-09 NOTE — Progress Notes (Signed)
Pharmacy Antibiotic Note  [name] is a 87 y.o. female admitted on 06/09/2021 with bacteremia.  Pharmacy has been consulted for cefepime dosing.  [id number] with PMH CKD on HD MWF, HTN, DM and CAD presenting with weakness and malaise x 3 days, as well as cough. Positive for COVID on remdesivir day 1. 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   On last admission, started cefepime 2 grams MWF during dialysis, to be continued at outpatient dialysis center. Per MD, continuing antibiotic therapy while inpatient until 9/14.  Plan: Continue cefepime 2 grams M, W, F during dialysis Daily CBC. Follow HD plans.  Height: 5\' 4"  (162.6 cm) Weight: 86.2 kg (190 lb) IBW/kg (Calculated) : 54.7  Temp (24hrs), Avg:97.9 F (36.6 C), Min:97.9 F (36.6 C), Max:97.9 F (36.6 C)  Recent Labs  Lab 06/08/21 0828 06/09/21 0851  WBC 5.4 6.0  CREATININE 5.71* 5.24*    Estimated Creatinine Clearance: 8.5 mL/min (A) (by C-G formula based on SCr of 5.24 mg/dL (H)).    Allergies  Allergen Reactions   Clonidine Other (See Comments) and Shortness Of Breath    Other reaction(s): Other (see comments) Other reaction(s): Other (See Comments)   Darvocet [Propoxyphene N-Acetaminophen] Anaphylaxis   Hydralazine     Other reaction(s): Other (See Comments) glomerulonephritis   Hydralazine Hcl Other (See Comments)    glomerulonephritis   Esomeprazole     Other reaction(s): Unknown   Guaifenesin     Other reaction(s): Unknown Other reaction(s): Unknown Other reaction(s): Unknown Other reaction(s): Unknown   Phenylephrine-Guaifenesin     Other reaction(s): Unknown   Rabeprazole     Other reaction(s): Unknown   Telithromycin     Other reaction(s): Unknown Other reaction(s): Unknown Other reaction(s): Unknown   Fluocinolone Other (See Comments)    Feels crazy Other reaction(s): Other (see comments) Other reaction(s): Other (See Comments) Feels crazy Feels  crazy   Iodinated Diagnostic Agents Nausea And Vomiting and Other (See Comments)    Burning Other reaction(s): Other (see comments) Other reaction(s): Other (See Comments) Burning Nausea & vomiting   Levaquin [Levofloxacin] Nausea Only   Statins Other (See Comments)    Myalgia Other reaction(s): Other (See Comments), Unknown, Unknown Myalgia    Antimicrobials this admission: 9/8 cefepime >>    Dose adjustments this admission: None  Microbiology results: 8/20 Bcx: Sphingobacterium multivorum (S: cefepime, S: ceftriaxone)   Thank you for allowing pharmacy to be a part of this patient's care.  Wynelle Cleveland, PharmD Pharmacy Resident  06/09/2021 2:41 PM

## 2021-06-09 NOTE — H&P (Addendum)
History and Physical   [name] TDV:[id number] DOB: 1934-08-04 DOA: 06/09/2021  PCP: Idelle Crouch, MD  Outpatient Specialists: Dr. Lucky Cowboy Patient coming from: Home via EMS  I have personally briefly reviewed patient's old medical records in [place].  Chief Concern: Weakness and unable to ambulate  HPI: [name] is a 87 y.o. 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  At bedside patient is able to tell me her name, her age, identify her husband at bedside, the current calendar year.  She endorsed weakness that started about two days. She denies fever. She endorses cough that is productive that started Saturday, 06/04/21. She states the cough is not productive and she feels like she needs to cough something up but is unable to. She reports she was feeling her baseline self last Thursday and she got her nails done.   She denies any focal deficits.  Her main complaint is generalized weakness.  Social history: She lives with her husband of 66, she denies tobacco use, etoh, recreational drug use. She formerly worked at [employer].  Vaccination history: She is vaccinated for covid 19, two doses of Pfizer.   ROS: Constitutional: no weight change, no fever ENT/Mouth: no sore throat, no rhinorrhea Eyes: no eye pain, no vision changes Cardiovascular: no chest pain, no dyspnea,  no edema, no palpitations Respiratory: no cough, no  sputum, no wheezing Gastrointestinal: no nausea, no vomiting, no diarrhea, no constipation Genitourinary: no urinary incontinence, no dysuria, no hematuria Musculoskeletal: no arthralgias, no myalgias Skin: no skin lesions, no pruritus, Neuro: + weakness, no loss of consciousness, no syncope Psych: no anxiety, no depression, + decrease appetite Heme/Lymph: no bruising, no bleeding  ED Course: Discussed with emergency medicine provider, patient requiring hospitalization for chief concerns of significant weakness.  Vitals in the emergency department was remarkable for temperature of 97.7, initial respiration rate of 22, heart rate of 88, blood pressure 163/78, SPO2 up 100% on 2 L nasal cannula.  Labs in the emergency department was remarkable for sodium 133, potassium 4.3, chloride 98, bicarb 24, BUN 38, nonfasting blood glucose 85, serum creatinine 5.24, GFR of 8, WBC 6, hemoglobin 10, platelets 149.  No medical intervention in the emergency department.  Assessment/Plan  Active Problems:   Hyperlipidemia   Hypertension   Breast cancer (HCC)   GERD (gastroesophageal reflux disease)   Coronary artery disease   S/P CABG x 4   Weakness   Pain syndrome, chronic   CAD (coronary artery disease)   Type 2 diabetes with nephropathy (HCC)   HTN, goal below 140/80   CKD (chronic kidney disease) stage 5, GFR less than 15 ml/min (HCC)   Ambulatory dysfunction   Anemia of chronic kidney failure, stage 5 ([place])   Bacteremia due to Staphylococcus   ESRD (end stage renal disease) on dialysis ([place])   COVID-19 virus infection   Hypothyroid   # Weakness-etiology is suspect to be multifactorial in setting of recent bacteremia, COVID infection, along with multiple chronic medical diagnoses including CAD status post CABG, end-stage renal disease on hemodialysis,, hypothyroid, chronic pain syndrome, non-insulin-dependent diabetes mellitus on sulfonylurea - Check B12,  magnesium, vitamin D in the a.m. -  PT, OT ordered - TOC consulted for home PT if possible - Fall precautions  # COVID-19 infection - IV remdesivir per pharmacy,  - Incentive spirometry and flutter valve for 10 reps every 2 hours while awake - Albuterol inhaler 2 puffs every 4 hours while awake - Daily labs: CMP, CBC, CRP, D-dimer - Supplemental oxygen to maintain SPO2 goal of greater than 88% - Airborne and contact precautions  # End-stage renal disease on hemodialysis - Nephrology order placed on epic for routine dialysis - A.m. 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 twice daily resumed  # CAD status post CABG in December 2013 - LIMA to the LAD, SVG to diagonal, SVG to OM1, SVG to PDA, EVH from bilateral thigh - Resumed home Plavix and aspirin  # Chronic pain syndrome-Norco 5 every 6 hours as needed for moderate pain has been resumed  # Hypothyroid-levothyroxine 150 mcg daily resumed  Chart reviewed.   Hospitalization from 05/21/2021 to 05/25/2021: Patient was hospitalized for acute hypoxic respiratory failure on chronic hypoxic respiratory failure secondary to volume overload.  Patient was also found to have bacteremia secondary to sphingobacterium multivorum, infectious disease was consulted and advises 2 weeks of antibiotics with cefepime, last dose on 9/14  DVT prophylaxis: Heparin  5000 units subcutaneous every 8 hours, TED hose Code Status: DNR/DNI Diet: Carb modified/renal Family Communication: Updated spouse at bedside Disposition Plan: Pending clinical course Consults called: None at this time Admission status: MedSurg, observation, telemetry ordered  Past Medical History:  Diagnosis Date   Arthritis    Bilateral Macular degeneration    Breast cancer ([place]) 2012   right breast   Breast mass, right    CAD S/P CABG x 4    a. 09/2012 CABG x 4: LIMA to LAD, SVG to Diag, SVG to OM1, SVG to PDA, EVH from bilateral thighs   Cancer ([place])    breast cancer, right side [id number]   Complication of anesthesia    Coronary artery disease 08/22/2012   sees Dr Rockey Situ   Diabetes [facility])    Diastolic dysfunction    a. 06/2020 Echo: EF 60-65%; b. 08/2020 Echo: EF 60-65%, no rwma, Gr1 DD. Nl RV size/fxn.   DOE (dyspnea on exertion)    ESRD (end stage renal disease) ([place])    Gastritis    hx of   GERD (gastroesophageal reflux disease)    Headache(784.0)    migraines   History of breast cancer    39 treatments of radiation. Negative chemo.   History of seasonal allergies    Hyperlipemia    Hypertension    sees Dr. Fulton Reek   Hypothyroidism    Neuropathy    Personal history of radiation therapy    Pneumonia    hx of   PONV (postoperative nausea and vomiting)    Stroke [facility])    2012   Vertigo  Past Surgical History:  Procedure Laterality Date   A/V FISTULAGRAM Left 01/13/2021   Procedure: A/V FISTULAGRAM;  Surgeon: Algernon Huxley, MD;  Location: [place] CV LAB;  Service: Cardiovascular;  Laterality: Left;   A/V SHUNT INTERVENTION Left 05/23/2021   Procedure: A/V SHUNT INTERVENTION;  Surgeon: Algernon Huxley, MD;  Location: [place] [facility];  Service: Cardiovascular;  Laterality: Left;   ABDOMINAL HYSTERECTOMY     APPENDECTOMY     AV FISTULA PLACEMENT Left 11/04/2020   Procedure: ARTERIOVENOUS (AV) FISTULA CREATION (BRACHIOCEPHALIC);  Surgeon: Algernon Huxley,  MD;  Location: [facility] ORS;  Service: Vascular;  Laterality: Left;   BACK SURGERY     BREAST BIOPSY Right    2012 positive- [facility]   BREAST LUMPECTOMY Right 2012   F/U radiation    BREAST SURGERY     CARDIAC CATHETERIZATION  2014   CAROTID PTA/STENT INTERVENTION Left 06/14/2020   Procedure: CAROTID PTA/STENT INTERVENTION;  Surgeon: Algernon Huxley, MD;  Location: [place] [facility];  Service: Cardiovascular;  Laterality: Left;   CATARACT EXTRACTION W/PHACO Right 08/14/2017   Procedure: CATARACT EXTRACTION PHACO AND INTRAOCULAR LENS PLACEMENT (IOC);  Surgeon: Birder Robson, MD;  Location: [facility] ORS;  Service: Ophthalmology;  Laterality: Right;  [place] 00:43.0 AP% 17.1 CDE 7.37 Fluid Pack lot # [id number]   CATARACT EXTRACTION W/PHACO Left 10/09/2017   Procedure: CATARACT EXTRACTION PHACO AND INTRAOCULAR LENS PLACEMENT (IOC);  Surgeon: Birder Robson, MD;  Location: [facility] ORS;  Service: Ophthalmology;  Laterality: Left;  [place] 00:30.3 AP% 11.0 CDE 3.33 Fluid Pack Lot # [id number]   CORONARY ARTERY BYPASS GRAFT  09/05/2012   Procedure: CORONARY ARTERY BYPASS GRAFTING (CABG);  Surgeon: Rexene Alberts, MD;  Location: [place];  Service: Open Heart Surgery;  Laterality: N/A;  CABG x four, using left internal mammary artery and bilateral greater saphenous vein harvested endoscopically   DIALYSIS/PERMA CATHETER INSERTION Left 06/25/2020   Procedure: DIALYSIS/PERMA CATHETER INSERTION;  Surgeon: Algernon Huxley, MD;  Location: [place] [facility];  Service: Cardiovascular;  Laterality: Left;   DIALYSIS/PERMA CATHETER INSERTION N/A 08/10/2020   Procedure: DIALYSIS/PERMA CATHETER INSERTION;  Surgeon: Katha Cabal, MD;  Location: [place] [facility];  Service: Cardiovascular;  Laterality: N/A;   DIALYSIS/PERMA CATHETER INSERTION N/A 05/23/2021   Procedure: DIALYSIS/PERMA CATHETER INSERTION;  Surgeon: Algernon Huxley, MD;  Location: [place] CV LAB;  Service: Cardiovascular;  Laterality: N/A;   DIALYSIS/PERMA  CATHETER REMOVAL N/A 08/05/2020   Procedure: DIALYSIS/PERMA CATHETER REMOVAL;  Surgeon: Algernon Huxley, MD;  Location: [place] [facility];  Service: Cardiovascular;  Laterality: N/A;   INTRAOPERATIVE TRANSESOPHAGEAL ECHOCARDIOGRAM  09/05/2012   Procedure: INTRAOPERATIVE TRANSESOPHAGEAL ECHOCARDIOGRAM;  Surgeon: Rexene Alberts, MD;  Location: [place];  Service: Open Heart Surgery;  Laterality: N/A;   KNEE SURGERY     Bilateral nerve block   LAPAROSCOPIC NISSEN FUNDOPLICATION     OVARIAN CYST REMOVAL     Social History:  reports that she quit smoking about 32 years ago. Her smoking use included cigarettes. She has never used smokeless tobacco. She reports that she does not drink alcohol and does not use drugs.  Allergies  Allergen Reactions   Clonidine Other (See Comments) and Shortness Of Breath    Other reaction(s): Other (see comments) Other reaction(s): Other (See Comments)   Darvocet [Propoxyphene N-Acetaminophen] Anaphylaxis   Hydralazine     Other reaction(s): Other (See Comments) glomerulonephritis   Hydralazine Hcl Other (See Comments)    glomerulonephritis  Esomeprazole     Other reaction(s): Unknown   Guaifenesin     Other reaction(s): Unknown Other reaction(s): Unknown Other reaction(s): Unknown Other reaction(s): Unknown   Phenylephrine-Guaifenesin     Other reaction(s): Unknown   Rabeprazole     Other reaction(s): Unknown   Telithromycin     Other reaction(s): Unknown Other reaction(s): Unknown Other reaction(s): Unknown   Fluocinolone Other (See Comments)    Feels crazy Other reaction(s): Other (see comments) Other reaction(s): Other (See Comments) Feels crazy Feels crazy   Iodinated Diagnostic Agents Nausea And Vomiting and Other (See Comments)    Burning Other reaction(s): Other (see comments) Other reaction(s): Other (See Comments) Burning Nausea & vomiting   Levaquin [Levofloxacin] Nausea Only   Statins Other (See Comments)    Myalgia Other  reaction(s): Other (See Comments), Unknown, Unknown Myalgia   Family History  Problem Relation Age of Onset   Heart disease Brother        CABG & stents   Hyperlipidemia Brother    Hypertension Brother    Cancer Father    Heart disease Mother    Breast cancer Cousin    Kidney disease Neg Hx    Family history: Family history reviewed and not pertinent  Prior to Admission medications   Medication Sig Start Date End Date Taking? Authorizing Provider  ALPRAZolam Duanne Moron) 0.5 MG tablet Take 0.5 mg by mouth 2 (two) times daily as needed for anxiety.     [provider]  ALPRAZolam Duanne Moron) 0.5 MG tablet Take 1 tablet by mouth 2 (two) times daily as needed. 05/26/21   [provider]  aspirin 81 MG chewable tablet Chew 81 mg by mouth every Monday, Wednesday, and Friday.     [provider]  calcium-vitamin D (OSCAL WITH D) 500-200 MG-UNIT tablet Take 1 tablet by mouth daily.    [provider]  clopidogrel (PLAVIX) 75 MG tablet Take 75 mg by mouth daily.    [provider]  Cyanocobalamin 1000 MCG SUBL Take 1,000 mcg by mouth daily.     [provider]  doxycycline (VIBRAMYCIN) 100 MG capsule Take 1 capsule (100 mg total) by mouth 2 (two) times daily for 10 days. 06/08/21 06/18/21  Carrie Mew, MD  famotidine (PEPCID) 20 MG tablet Take 1 tablet (20 mg total) by mouth 2 (two) times daily for 10 days. 06/08/21 06/18/21  Carrie Mew, MD  gabapentin (NEURONTIN) 100 MG capsule Take 1 capsule by mouth 2 (two) times daily. 04/11/21   [provider]  hydrALAZINE (APRESOLINE) 25 MG tablet Take 25 mg by mouth 3 (three) times daily.    [provider]  HYDROcodone-acetaminophen (NORCO/VICODIN) 5-325 MG tablet Take 1 tablet by mouth every 6 (six) hours as needed for moderate pain. 11/04/20   Algernon Huxley, MD  levothyroxine (SYNTHROID) 150 MCG tablet Take by mouth. 04/19/21   [provider]  metoprolol tartrate (LOPRESSOR) 50  MG tablet Take 50 mg by mouth in the morning, at noon, and at bedtime.    [provider]  neomycin-bacitracin-polymyxin (NEOSPORIN) ointment Apply topically 2 (two) times daily. 05/25/21   Fritzi Mandes, MD  ondansetron (ZOFRAN ODT) 4 MG disintegrating tablet Take 1 tablet (4 mg total) by mouth every 8 (eight) hours as needed for nausea or vomiting. 06/08/21   Carrie Mew, MD  OXYGEN Inhale 2 L into the lungs daily.    [provider]  torsemide (DEMADEX) 100 MG tablet Take 1 tablet (100 mg total) by mouth 3 (  three) times a week. On Non dialysis days 05/25/21   Fritzi Mandes, MD   Physical Exam: Vitals:   06/09/21 0802 06/09/21 1144  BP: (!) 163/78 (!) 161/68  Pulse: 88 85  Resp: (!) 22 16  Temp: 97.9 F (36.6 C)   TempSrc: Oral   SpO2: 100% 99%  Weight: 86.2 kg   Height: 5\' 4"  (1.626 m)    Constitutional: appears age-appropriate, frail, NAD, calm, comfortable Eyes: PERRL, lids and conjunctivae normal ENMT: Mucous membranes are moist. Posterior pharynx clear of any exudate or lesions. Age-appropriate dentition.  Mild hearing loss Neck: normal, supple, no masses, no thyromegaly Respiratory: clear to auscultation bilaterally, no wheezing, no crackles. Normal respiratory effort. No accessory muscle use.  Cardiovascular: Regular rate and rhythm, no murmurs / rubs / gallops. No extremity edema. 2+ pedal pulses. No carotid bruits.  Abdomen: Obese abdomen, no tenderness, no masses palpated, no hepatosplenomegaly. Bowel sounds positive.  Musculoskeletal: no clubbing / cyanosis. No joint deformity upper and lower extremities. Good ROM, no contractures, no atrophy. Normal muscle tone.  Skin: no rashes, lesions, ulcers. No induration Neurologic: Sensation intact. Strength 5/5 in all 4.  Psychiatric: Normal judgment and insight. Alert and oriented x 3. Normal mood.   EKG: independently reviewed, showing sinus/ectopic rhythm with rate of 81, QTc 438  Chest x-ray on Admission: I  personally reviewed and I agree with radiologist reading as below.  DG Chest Portable 1 View  Result Date: 06/08/2021 CLINICAL DATA:  COVID-19 positive.  Cough. EXAM: PORTABLE CHEST 1 VIEW COMPARISON:  May 21, 2021. FINDINGS: Stable cardiomegaly. Status post coronary artery bypass graft. No pneumothorax is noted. Stable left basilar opacity is noted concerning for atelectasis or infiltrate with associated pleural effusion. Mildly increased right midlung opacity is noted concerning for worsening atelectasis or possibly infiltrate. Bony thorax is unremarkable. IMPRESSION: Stable left basilar opacity is noted as described above. Mildly increased right midlung opacity is noted concerning for worsening atelectasis or possibly infiltrate. Electronically Signed   By: Marijo Conception M.D.   On: 06/08/2021 09:09    Labs on Admission: I have personally reviewed following labs  CBC: Recent Labs  Lab 06/08/21 0828 06/09/21 0851  WBC 5.4 6.0  NEUTROABS 3.9  --   HGB 9.1* 10.0*  HCT 30.6* 34.2*  MCV 101.0* 102.1*  PLT 149* 814*   Basic Metabolic Panel: Recent Labs  Lab 06/08/21 0828 06/09/21 0851  NA 133* 133*  K 4.5 4.3  CL 97* 98  CO2 28 24  GLUCOSE 105* 85  BUN 39* 38*  CREATININE 5.71* 5.24*  CALCIUM 7.9* 8.1*   GFR: Estimated Creatinine Clearance: 8.5 mL/min (A) (by C-G formula based on SCr of 5.24 mg/dL (H)).  Liver Function Tests: Recent Labs  Lab 06/08/21 0828  AST 8*  ALT <5  ALKPHOS 90  BILITOT 0.7  PROT 7.9  ALBUMIN 2.7*   Recent Labs  Lab 06/08/21 0828  LIPASE 30   Urine analysis:    Component Value Date/Time   COLORURINE RED (A) 06/23/2020 1025   APPEARANCEUR HAZY (A) 06/23/2020 1025   APPEARANCEUR Clear 05/03/2014 0503   LABSPEC 1.007 06/23/2020 1025   LABSPEC 1.015 05/03/2014 0503   PHURINE  06/23/2020 1025    TEST NOT REPORTED DUE TO COLOR INTERFERENCE OF URINE PIGMENT   GLUCOSEU (A) 06/23/2020 1025    TEST NOT REPORTED DUE TO COLOR INTERFERENCE OF  URINE PIGMENT   GLUCOSEU Negative 05/03/2014 0503   HGBUR (A) 06/23/2020 1025  TEST NOT REPORTED DUE TO COLOR INTERFERENCE OF URINE PIGMENT   BILIRUBINUR (A) 06/23/2020 1025    TEST NOT REPORTED DUE TO COLOR INTERFERENCE OF URINE PIGMENT   BILIRUBINUR Negative 05/03/2014 0503   KETONESUR (A) 06/23/2020 1025    TEST NOT REPORTED DUE TO COLOR INTERFERENCE OF URINE PIGMENT   PROTEINUR (A) 06/23/2020 1025    TEST NOT REPORTED DUE TO COLOR INTERFERENCE OF URINE PIGMENT   UROBILINOGEN 0.2 09/03/2012 1355   NITRITE (A) 06/23/2020 1025    TEST NOT REPORTED DUE TO COLOR INTERFERENCE OF URINE PIGMENT   LEUKOCYTESUR (A) 06/23/2020 1025    TEST NOT REPORTED DUE TO COLOR INTERFERENCE OF URINE PIGMENT   LEUKOCYTESUR Negative 05/03/2014 0503   Dr. Tobie Poet [facility]  If 7PM-7AM, please contact overnight-coverage provider If 7AM-7PM, please contact day coverage provider [email or web address]  06/09/2021, 1:16 PM

## 2021-06-09 NOTE — ED Notes (Signed)
Ariel RN aware of assigned bed 

## 2021-06-09 NOTE — ED Provider Notes (Signed)
[facility] Emergency Department Provider Note  ____________________________________________  Time seen: Approximately 12:30 PM  I have reviewed the triage vital signs and the nursing notes.   HISTORY  Chief Complaint Weakness    HPI [name] is a 87 y.o. female with a history of CAD, ESRD on hemodialysis Monday Wednesday Friday, hypertension, diabetes, diagnosed with COVID 2 days ago who complains of generalized weakness and malaise for the past 3 days which is constant and worsening.  She was prescribed Tessalon and molnupiravir 2 days ago which she is taking.  Denies shortness of breath or chest pain.  Last went to dialysis yesterday.   This morning, she felt so fatigued and weak that she could not walk, and she fell when she tried. She denies serious injury or head injury with the fall.  No acute pain currently.   She reports eating and drinking okay.  No dizziness or passing out.  Past Medical History:  Diagnosis Date   Arthritis    Bilateral Macular degeneration    Breast cancer ([place]) 2012   right breast   Breast mass, right    CAD S/P CABG x 4    a. 09/2012 CABG x 4: LIMA to LAD, SVG to Diag, SVG to OM1, SVG to PDA, EVH from bilateral thighs   Cancer ([place])    breast cancer, right side [id number]   Complication of anesthesia    Coronary artery disease 08/22/2012   sees Dr Rockey Situ   Diabetes [facility])    Diastolic dysfunction    a. 06/2020 Echo: EF 60-65%; b. 08/2020 Echo: EF 60-65%, no rwma, Gr1 DD. Nl RV size/fxn.   DOE (dyspnea on exertion)    ESRD (end stage renal disease) ([place])    Gastritis    hx of   GERD (gastroesophageal reflux disease)    Headache(784.0)    migraines   History of breast cancer    39 treatments of radiation. Negative chemo.   History of seasonal allergies    Hyperlipemia    Hypertension    sees Dr. Fulton Reek   Hypothyroidism    Neuropathy    Personal history of radiation therapy    Pneumonia    hx of    PONV (postoperative nausea and vomiting)    Stroke ([place])    2012   Vertigo      Patient Active Problem List   Diagnosis Date Noted   COVID-19 virus infection 06/09/2021   Pulmonary edema 56/38/7564   Acute diastolic CHF (congestive heart failure) ([place]) 05/21/2021   Macular degeneration    Swelling of limb 03/29/2021   Lower limb ulcer, ankle, right, limited to breakdown of skin ([place]) 03/08/2021   Bacteremia due to Staphylococcus 08/11/2020   Pressure injury of skin 08/05/2020   Wound infection 08/04/2020   History of CVA with residual deficit 08/04/2020   Ambulatory dysfunction 08/04/2020   Anemia of chronic kidney failure, stage 5 ([place]) 08/04/2020   ESRD on hemodialysis ([place]) 07/16/2020   ESRD (end stage renal disease) on dialysis ([place]) 07/14/2020   Symptomatic anemia 07/01/2020   Hyperkalemia 06/22/2020   Prolonged QT interval 06/22/2020   Stroke ([place]) 06/01/2020   CAD (coronary artery disease) 06/01/2020   Thyroid disease 06/01/2020   CKD (chronic kidney disease) stage 5, GFR less than 15 ml/min (HCC) 06/01/2020   Carotid stenosis 06/01/2020   Pain syndrome, chronic 02/16/2020   Weakness 08/30/2016   Acute renal failure (ARF) ([place]) 05/04/2016   Type 2 diabetes with nephropathy (  [place]) 09/02/2015   HTN, goal below 140/80 08/20/2015   Diabetes mellitus type 2, uncomplicated ([place]) 69/48/5462   Cerebral embolism with cerebral infarction ([place]) 09/07/2012   Hemiplegia, unspecified, affecting dominant side 09/07/2012   S/P CABG x 4 09/05/2012   Coronary artery disease 08/22/2012   Hyperlipidemia 08/19/2012   Diabetes mellitus ([place]) 08/19/2012   Hypertension 08/19/2012   Breast cancer ([place]) 08/19/2012   GERD (gastroesophageal reflux disease) 08/19/2012     Past Surgical History:  Procedure Laterality Date   A/V FISTULAGRAM Left 01/13/2021   Procedure: A/V FISTULAGRAM;  Surgeon: Algernon Huxley, MD;  Location: [place] [facility];  Service: Cardiovascular;  Laterality: Left;    A/V SHUNT INTERVENTION Left 05/23/2021   Procedure: A/V SHUNT INTERVENTION;  Surgeon: Algernon Huxley, MD;  Location: [place] [facility];  Service: Cardiovascular;  Laterality: Left;   ABDOMINAL HYSTERECTOMY     APPENDECTOMY     AV FISTULA PLACEMENT Left 11/04/2020   Procedure: ARTERIOVENOUS (AV) FISTULA CREATION (BRACHIOCEPHALIC);  Surgeon: Algernon Huxley, MD;  Location: [facility] ORS;  Service: Vascular;  Laterality: Left;   BACK SURGERY     BREAST BIOPSY Right    2012 positive- [facility]   BREAST LUMPECTOMY Right 2012   F/U radiation    BREAST SURGERY     CARDIAC CATHETERIZATION  2014   CAROTID PTA/STENT INTERVENTION Left 06/14/2020   Procedure: CAROTID PTA/STENT INTERVENTION;  Surgeon: Algernon Huxley, MD;  Location: [place] CV LAB;  Service: Cardiovascular;  Laterality: Left;   CATARACT EXTRACTION W/PHACO Right 08/14/2017   Procedure: CATARACT EXTRACTION PHACO AND INTRAOCULAR LENS PLACEMENT (IOC);  Surgeon: Birder Robson, MD;  Location: [facility] ORS;  Service: Ophthalmology;  Laterality: Right;  [place] 00:43.0 AP% 17.1 CDE 7.37 Fluid Pack lot # [id number]   CATARACT EXTRACTION W/PHACO Left 10/09/2017   Procedure: CATARACT EXTRACTION PHACO AND INTRAOCULAR LENS PLACEMENT (IOC);  Surgeon: Birder Robson, MD;  Location: [facility] ORS;  Service: Ophthalmology;  Laterality: Left;  [place] 00:30.3 AP% 11.0 CDE 3.33 Fluid Pack Lot # [id number]   CORONARY ARTERY BYPASS GRAFT  09/05/2012   Procedure: CORONARY ARTERY BYPASS GRAFTING (CABG);  Surgeon: Rexene Alberts, MD;  Location: [place];  Service: Open Heart Surgery;  Laterality: N/A;  CABG x four, using left internal mammary artery and bilateral greater saphenous vein harvested endoscopically   DIALYSIS/PERMA CATHETER INSERTION Left 06/25/2020   Procedure: DIALYSIS/PERMA CATHETER INSERTION;  Surgeon: Algernon Huxley, MD;  Location: [place] [facility];  Service: Cardiovascular;  Laterality: Left;   DIALYSIS/PERMA CATHETER INSERTION N/A 08/10/2020   Procedure: DIALYSIS/PERMA  CATHETER INSERTION;  Surgeon: Katha Cabal, MD;  Location: [place] [facility];  Service: Cardiovascular;  Laterality: N/A;   DIALYSIS/PERMA CATHETER INSERTION N/A 05/23/2021   Procedure: DIALYSIS/PERMA CATHETER INSERTION;  Surgeon: Algernon Huxley, MD;  Location: [place] [facility];  Service: Cardiovascular;  Laterality: N/A;   DIALYSIS/PERMA CATHETER REMOVAL N/A 08/05/2020   Procedure: DIALYSIS/PERMA CATHETER REMOVAL;  Surgeon: Algernon Huxley, MD;  Location: [place] [facility];  Service: Cardiovascular;  Laterality: N/A;   INTRAOPERATIVE TRANSESOPHAGEAL ECHOCARDIOGRAM  09/05/2012   Procedure: INTRAOPERATIVE TRANSESOPHAGEAL ECHOCARDIOGRAM;  Surgeon: Rexene Alberts, MD;  Location: [place];  Service: Open Heart Surgery;  Laterality: N/A;   KNEE SURGERY     Bilateral nerve block   LAPAROSCOPIC NISSEN FUNDOPLICATION     OVARIAN CYST REMOVAL       Prior to Admission medications   Medication Sig Start Date End Date Taking? Authorizing Provider  ALPRAZolam (XANAX) 0.5 MG tablet Take 0.5 mg by mouth 2 (two) times daily as needed for anxiety.     [provider]  ALPRAZolam Duanne Moron) 0.5 MG tablet Take 1 tablet by mouth 2 (two) times daily as needed. 05/26/21   [provider]  aspirin 81 MG chewable tablet Chew 81 mg by mouth every Monday, Wednesday, and Friday.     [provider]  calcium-vitamin D (OSCAL WITH D) 500-200 MG-UNIT tablet Take 1 tablet by mouth daily.    [provider]  clopidogrel (PLAVIX) 75 MG tablet Take 75 mg by mouth daily.    [provider]  Cyanocobalamin 1000 MCG SUBL Take 1,000 mcg by mouth daily.     [provider]  doxycycline (VIBRAMYCIN) 100 MG capsule Take 1 capsule (100 mg total) by mouth 2 (two) times daily for 10 days. 06/08/21 06/18/21  Carrie Mew, MD  famotidine (PEPCID) 20 MG tablet Take 1 tablet (20 mg total) by mouth 2 (two) times daily for 10 days. 06/08/21 06/18/21  Carrie Mew, MD  gabapentin  (NEURONTIN) 100 MG capsule Take 1 capsule by mouth 2 (two) times daily. 04/11/21   [provider]  hydrALAZINE (APRESOLINE) 25 MG tablet Take 25 mg by mouth 3 (three) times daily.    [provider]  HYDROcodone-acetaminophen (NORCO/VICODIN) 5-325 MG tablet Take 1 tablet by mouth every 6 (six) hours as needed for moderate pain. 11/04/20   Algernon Huxley, MD  levothyroxine (SYNTHROID) 150 MCG tablet Take by mouth. 04/19/21   [provider]  metoprolol tartrate (LOPRESSOR) 50 MG tablet Take 50 mg by mouth in the morning, at noon, and at bedtime.    [provider]  neomycin-bacitracin-polymyxin (NEOSPORIN) ointment Apply topically 2 (two) times daily. 05/25/21   Fritzi Mandes, MD  ondansetron (ZOFRAN ODT) 4 MG disintegrating tablet Take 1 tablet (4 mg total) by mouth every 8 (eight) hours as needed for nausea or vomiting. 06/08/21   Carrie Mew, MD  OXYGEN Inhale 2 L into the lungs daily.    [provider]  torsemide (DEMADEX) 100 MG tablet Take 1 tablet (100 mg total) by mouth 3 (three) times a week. On Non dialysis days 05/25/21   Fritzi Mandes, MD     Allergies Clonidine, Darvocet [propoxyphene n-acetaminophen], Hydralazine, Hydralazine hcl, Esomeprazole, Guaifenesin, Phenylephrine-guaifenesin, Rabeprazole, Telithromycin, Fluocinolone, Iodinated diagnostic agents, Levaquin [levofloxacin], and Statins   Family History  Problem Relation Age of Onset   Heart disease Brother        CABG & stents   Hyperlipidemia Brother    Hypertension Brother    Cancer Father    Heart disease Mother    Breast cancer Cousin    Kidney disease Neg Hx     Social History Social History   Tobacco Use   Smoking status: Former    Years: 3.00    Types: Cigarettes    Quit date: 11/13/1988    Years since quitting: 32.5   Smokeless tobacco: Never   Tobacco comments:    At the most 1 pack would last over a week.  Vaping Use   Vaping Use: Never used  Substance Use  Topics   Alcohol use: No   Drug use: No    Review of Systems  Constitutional:   No fever or chills.  ENT:   No sore throat. No rhinorrhea. Cardiovascular:   No chest pain or syncope. Respiratory:   No dyspnea or cough. Gastrointestinal:   Negative for abdominal pain, vomiting  and diarrhea.  Musculoskeletal:   Negative for focal pain or swelling All other systems reviewed and are negative except as documented above in ROS and HPI.  ____________________________________________   PHYSICAL EXAM:  VITAL SIGNS: ED Triage Vitals  Enc Vitals Group     BP 06/08/21 0805 (!) 143/64     Pulse Rate 06/08/21 0805 81     Resp 06/08/21 0805 20     Temp 06/08/21 0805 98 F (36.7 C)     Temp Source 06/08/21 0805 Oral     SpO2 06/08/21 0800 96 %     Weight 06/08/21 0806 180 lb (81.6 kg)     Height 06/08/21 0806 5\' 4"  (1.626 m)     Head Circumference --      Peak Flow --      Pain Score 06/08/21 0806 0     Pain Loc --      Pain Edu? --      Excl. in [place]? --     Vital signs reviewed, nursing assessments reviewed.   Constitutional:   Alert and oriented. Non-toxic appearance. Eyes:   Conjunctivae are normal. EOMI. PERRL. ENT      Head:   Normocephalic and atraumatic.      Nose:   Wearing a mask.      Mouth/Throat:   Wearing a mask.      Neck:   No meningismus. Full ROM. Hematological/Lymphatic/Immunilogical:   No cervical lymphadenopathy. Cardiovascular:   RRR. Symmetric bilateral radial and DP pulses.  No murmurs. Cap refill less than 2 seconds.  Left upper extremity AV fistula with strong thrill Respiratory:   Normal respiratory effort without tachypnea/retractions. Breath sounds are clear and equal bilaterally. No wheezes/rales/rhonchi. Gastrointestinal:   Soft and nontender. Non distended. There is no CVA tenderness.  No rebound, rigidity, or guarding. Genitourinary:   deferred Musculoskeletal:   Normal range of motion in all extremities. No joint effusions.  No lower extremity  tenderness, hips nontender.  Chronic lymphedema Neurologic:   Normal speech and language.  Motor grossly intact. No acute focal neurologic deficits are appreciated.  Skin:    Skin is warm, dry and intact. No rash noted.  No petechiae, purpura, or bullae.  ____________________________________________    LABS (pertinent positives/negatives) (all labs ordered are listed, but only abnormal results are displayed) Labs Reviewed  BASIC METABOLIC PANEL - Abnormal; Notable for the following components:      Result Value   Sodium 133 (*)    BUN 38 (*)    Creatinine, Ser 5.24 (*)    Calcium 8.1 (*)    GFR, Estimated 8 (*)    All other components within normal limits  CBC - Abnormal; Notable for the following components:   RBC 3.35 (*)    Hemoglobin 10.0 (*)    HCT 34.2 (*)    MCV 102.1 (*)    MCHC 29.2 (*)    RDW 16.8 (*)    Platelets 149 (*)    All other components within normal limits  SARS CORONAVIRUS 2 (TAT 6-24 HRS)  URINALYSIS, COMPLETE (UACMP) WITH MICROSCOPIC   ____________________________________________   EKG   Interpreted by me Sinus rhythm rate of 87, left axis, first-degree AV block.  Poor R wave progression.  Normal ST segments and T waves.  No ischemic changes.  ____________________________________________    RADIOLOGY  No results found.  ____________________________________________   PROCEDURES Procedures  ____________________________________________    CLINICAL IMPRESSION / ASSESSMENT AND PLAN / ED COURSE  Medications ordered  in the ED: Medications  aspirin chewable tablet 81 mg (has no administration in time range)  HYDROcodone-acetaminophen (NORCO/VICODIN) 5-325 MG per tablet 1 tablet (has no administration in time range)  hydrALAZINE (APRESOLINE) tablet 25 mg (has no administration in time range)  torsemide (DEMADEX) tablet 100 mg (has no administration in time range)  ALPRAZolam (XANAX) tablet 0.5 mg (has no administration in time range)   levothyroxine (SYNTHROID) tablet 150 mcg (has no administration in time range)  famotidine (PEPCID) tablet 20 mg (has no administration in time range)  clopidogrel (PLAVIX) tablet 75 mg (has no administration in time range)  acetaminophen (TYLENOL) tablet 650 mg (has no administration in time range)  ondansetron (ZOFRAN) tablet 4 mg (has no administration in time range)    Or  ondansetron (ZOFRAN) injection 4 mg (has no administration in time range)  heparin injection 5,000 Units (has no administration in time range)  remdesivir 200 mg in sodium chloride 0.9% 250 mL IVPB (has no administration in time range)    Followed by  remdesivir 100 mg in sodium chloride 0.9 % 100 mL IVPB (has no administration in time range)  chlorpheniramine-HYDROcodone (TUSSIONEX) 10-8 MG/5ML suspension 5 mL (has no administration in time range)    Pertinent labs & imaging results that were available during my care of the patient were reviewed by me and considered in my medical decision making (see chart for details).  MARJIE CHEA was evaluated in Emergency Department on 06/09/2021 for the symptoms described in the history of present illness. She was evaluated in the context of the global COVID-19 pandemic, which necessitated consideration that the patient might be at risk for infection with the SARS-CoV-2 virus that causes COVID-19. Institutional protocols and algorithms that pertain to the evaluation of patients at risk for COVID-19 are in a state of rapid change based on information released by regulatory bodies including the CDC and federal and state organizations. These policies and algorithms were followed during the patient's care in the ED.   Patient presents with cough, weakness, and known COVID infection.  Has constitutional symptoms but no focal chest pain or shortness of breath.     Oxygenation is stable on her usual 4 L nasal cannula.    Functional status is declining to the point where pt is a safety  risk at home due to falls and unable to perform her ADLs in the setting of acute illness.  Will contact hospitalist for further management until improving.      ____________________________________________   FINAL CLINICAL IMPRESSION(S) / ED DIAGNOSES    Final diagnoses:  COVID-19 virus infection  Generalized weakness  ESRD on hemodialysis [facility])     ED Discharge Orders     None       Portions of this note were generated with dragon dictation software. Dictation errors may occur despite best attempts at proofreading.    Carrie Mew, MD 06/08/21 [place], MD 06/09/21 [phone]

## 2021-06-09 NOTE — ED Notes (Signed)
Pt O2 sats dropping to 87% on 2 L [place], after pt moving and coughing. Pt O2 increased to 3 L [place]. Sats improved to 92 on 3 L. Pt given lunch tray.

## 2021-06-09 NOTE — ED Triage Notes (Signed)
Pt comes into the ED via EMS from home with Covid + for the past week, states she fell last night, this morning she is too weak to ambulate on her on. Was seen here yesterday and released  155/75 99% on 2L [place] 88HR

## 2021-06-09 NOTE — Consult Note (Signed)
[place] Nurse Consult Note: Reason for Consult:Bilateral LEs.  History of ulcerations, currently with an ulcer on the right LE (Clinical CEAP 6) and weeping of the left LE. Previously managed with Unna's Boots.  Admitted today with Covid-19 and subsequent weakness. Wound type: venous insufficiency Pressure Injury POA: N/A Measurement: RLE: 2.5cm x 0.2cm at last visit with Dr. Lucky Cowboy on 8/30 Left LE: with pinpoint open areas and weeping Periwound: erythema, edema Dressing procedure/placement/frequency: I have communicated with Dr. Fatima Sanger via Secure Chat and the POC for this admission will be topical wound care and modified compression. See below:   Wash legs with soap and water, rinse and pat dry. Dry between toes. Apply Mepitel silicone wound contact layer to the right LE wound, top with size appropriate piece of silver hydrofiber (Aquacel Ag+ Advantage, [place] # [id number]), cover with an ABD pad. Apply xeroform gauze Kellie Simmering # [id number]) to the left LE areas of weeping and cover with an ABD pad. Secure dressings by wrapping both LEs from just below toes to just below knees with Kerlix roll gauze/paper tape. Top with Coban self-adherent wrap wrapped in a similar manner. Change daily.   Pressure injury prevention interventions will be to  Turn and reposition per house protocol, minimizing time in the supine position Place a prophylactic silicone foam bordered dressing to the sacrum per house protocol Place heels into Prevalon pressure redistribution heel boots   WOC nursing team will not follow, but will remain available to this patient, the nursing and medical teams.  Please re-consult if needed. Thanks, Maudie Flakes, MSN, RN, [place], Arther Abbott  Pager# [phone]

## 2021-06-09 NOTE — ED Notes (Signed)
Lab called for difficult blood draw

## 2021-06-09 NOTE — ED Triage Notes (Signed)
See first nurse note, pt reports fall this am from weakness d/t covid. Denies hitting head.  Wears 2 L [place] chronic. Denies pain. States just too weak

## 2021-06-10 DIAGNOSIS — L89626 Pressure-induced deep tissue damage of left heel: Secondary | ICD-10-CM | POA: Diagnosis present

## 2021-06-10 DIAGNOSIS — N186 End stage renal disease: Secondary | ICD-10-CM

## 2021-06-10 DIAGNOSIS — N2581 Secondary hyperparathyroidism of renal origin: Secondary | ICD-10-CM | POA: Diagnosis present

## 2021-06-10 DIAGNOSIS — J9621 Acute and chronic respiratory failure with hypoxia: Secondary | ICD-10-CM | POA: Diagnosis present

## 2021-06-10 DIAGNOSIS — W19XXXA Unspecified fall, initial encounter: Secondary | ICD-10-CM | POA: Diagnosis present

## 2021-06-10 DIAGNOSIS — I132 Hypertensive heart and chronic kidney disease with heart failure and with stage 5 chronic kidney disease, or end stage renal disease: Secondary | ICD-10-CM | POA: Diagnosis present

## 2021-06-10 DIAGNOSIS — L89301 Pressure ulcer of unspecified buttock, stage 1: Secondary | ICD-10-CM | POA: Diagnosis present

## 2021-06-10 DIAGNOSIS — Y92009 Unspecified place in unspecified non-institutional (private) residence as the place of occurrence of the external cause: Secondary | ICD-10-CM | POA: Diagnosis not present

## 2021-06-10 DIAGNOSIS — G9341 Metabolic encephalopathy: Secondary | ICD-10-CM | POA: Diagnosis not present

## 2021-06-10 DIAGNOSIS — E039 Hypothyroidism, unspecified: Secondary | ICD-10-CM | POA: Diagnosis present

## 2021-06-10 DIAGNOSIS — E669 Obesity, unspecified: Secondary | ICD-10-CM | POA: Diagnosis present

## 2021-06-10 DIAGNOSIS — E1140 Type 2 diabetes mellitus with diabetic neuropathy, unspecified: Secondary | ICD-10-CM | POA: Diagnosis present

## 2021-06-10 DIAGNOSIS — Z992 Dependence on renal dialysis: Secondary | ICD-10-CM

## 2021-06-10 DIAGNOSIS — R531 Weakness: Secondary | ICD-10-CM

## 2021-06-10 DIAGNOSIS — E11622 Type 2 diabetes mellitus with other skin ulcer: Secondary | ICD-10-CM | POA: Diagnosis present

## 2021-06-10 DIAGNOSIS — D631 Anemia in chronic kidney disease: Secondary | ICD-10-CM | POA: Diagnosis present

## 2021-06-10 DIAGNOSIS — E1122 Type 2 diabetes mellitus with diabetic chronic kidney disease: Secondary | ICD-10-CM | POA: Diagnosis present

## 2021-06-10 DIAGNOSIS — L97809 Non-pressure chronic ulcer of other part of unspecified lower leg with unspecified severity: Secondary | ICD-10-CM | POA: Diagnosis present

## 2021-06-10 DIAGNOSIS — L89151 Pressure ulcer of sacral region, stage 1: Secondary | ICD-10-CM | POA: Diagnosis present

## 2021-06-10 DIAGNOSIS — I10 Essential (primary) hypertension: Secondary | ICD-10-CM | POA: Diagnosis not present

## 2021-06-10 DIAGNOSIS — I5042 Chronic combined systolic (congestive) and diastolic (congestive) heart failure: Secondary | ICD-10-CM | POA: Diagnosis present

## 2021-06-10 DIAGNOSIS — J1282 Pneumonia due to coronavirus disease 2019: Secondary | ICD-10-CM | POA: Diagnosis present

## 2021-06-10 DIAGNOSIS — Z79890 Hormone replacement therapy (postmenopausal): Secondary | ICD-10-CM | POA: Diagnosis not present

## 2021-06-10 DIAGNOSIS — Z6832 Body mass index (BMI) 32.0-32.9, adult: Secondary | ICD-10-CM | POA: Diagnosis not present

## 2021-06-10 DIAGNOSIS — U071 COVID-19: Secondary | ICD-10-CM | POA: Diagnosis present

## 2021-06-10 DIAGNOSIS — E1151 Type 2 diabetes mellitus with diabetic peripheral angiopathy without gangrene: Secondary | ICD-10-CM | POA: Diagnosis present

## 2021-06-10 DIAGNOSIS — I959 Hypotension, unspecified: Secondary | ICD-10-CM | POA: Diagnosis not present

## 2021-06-10 DIAGNOSIS — Z66 Do not resuscitate: Secondary | ICD-10-CM | POA: Diagnosis present

## 2021-06-10 LAB — COMPREHENSIVE METABOLIC PANEL WITH GFR
ALT: <5 U/L (ref 0–44)
AST: 7 U/L — ABNORMAL LOW (ref 15–41)
Albumin: 2.2 g/dL — ABNORMAL LOW (ref 3.5–5.0)
Alkaline Phosphatase: 80 U/L (ref 38–126)
Anion gap: 7 (ref 5–15)
BUN: 48 mg/dL — ABNORMAL HIGH (ref 8–23)
CO2: 26 mmol/L (ref 22–32)
Calcium: 7.4 mg/dL — ABNORMAL LOW (ref 8.9–10.3)
Chloride: 100 mmol/L (ref 98–111)
Creatinine, Ser: 6.29 mg/dL — ABNORMAL HIGH (ref 0.44–1.00)
GFR, Estimated: 6 mL/min — ABNORMAL LOW
Glucose, Bld: 88 mg/dL (ref 70–99)
Potassium: 4.1 mmol/L (ref 3.5–5.1)
Sodium: 133 mmol/L — ABNORMAL LOW (ref 135–145)
Total Bilirubin: 0.5 mg/dL (ref 0.3–1.2)
Total Protein: 6.7 g/dL (ref 6.5–8.1)

## 2021-06-10 LAB — CBC WITH DIFFERENTIAL/PLATELET
Abs Immature Granulocytes: 0.05 K/uL (ref 0.00–0.07)
Basophils Absolute: 0 K/uL (ref 0.0–0.1)
Basophils Relative: 1 %
Eosinophils Absolute: 0.1 K/uL (ref 0.0–0.5)
Eosinophils Relative: 3 %
HCT: 26.9 % — ABNORMAL LOW (ref 36.0–46.0)
Hemoglobin: 8.2 g/dL — ABNORMAL LOW (ref 12.0–15.0)
Immature Granulocytes: 1 %
Lymphocytes Relative: 22 %
Lymphs Abs: 1 K/uL (ref 0.7–4.0)
MCH: 30.9 pg (ref 26.0–34.0)
MCHC: 30.5 g/dL (ref 30.0–36.0)
MCV: 101.5 fL — ABNORMAL HIGH (ref 80.0–100.0)
Monocytes Absolute: 0.5 K/uL (ref 0.1–1.0)
Monocytes Relative: 10 %
Neutro Abs: 2.9 K/uL (ref 1.7–7.7)
Neutrophils Relative %: 63 %
Platelets: 151 K/uL (ref 150–400)
RBC: 2.65 MIL/uL — ABNORMAL LOW (ref 3.87–5.11)
RDW: 16.7 % — ABNORMAL HIGH (ref 11.5–15.5)
WBC: 4.7 K/uL (ref 4.0–10.5)
nRBC: 0 % (ref 0.0–0.2)

## 2021-06-10 LAB — VITAMIN B12: Vitamin B-12: 1140 pg/mL — ABNORMAL HIGH (ref 180–914)

## 2021-06-10 LAB — VITAMIN D 25 HYDROXY (VIT D DEFICIENCY, FRACTURES): Vit D, 25-Hydroxy: 32.73 ng/mL (ref 30–100)

## 2021-06-10 LAB — C-REACTIVE PROTEIN: CRP: 4 mg/dL — ABNORMAL HIGH

## 2021-06-10 LAB — GLUCOSE, CAPILLARY
Glucose-Capillary: 83 mg/dL (ref 70–99)
Glucose-Capillary: 91 mg/dL (ref 70–99)
Glucose-Capillary: 109 mg/dL — ABNORMAL HIGH (ref 70–99)

## 2021-06-10 LAB — MAGNESIUM: Magnesium: 2 mg/dL (ref 1.7–2.4)

## 2021-06-10 LAB — D-DIMER, QUANTITATIVE: D-Dimer, Quant: 0.93 ug{FEU}/mL — ABNORMAL HIGH (ref 0.00–0.50)

## 2021-06-10 MED ORDER — CHLORHEXIDINE GLUCONATE CLOTH 2 % EX PADS
6.0000 | MEDICATED_PAD | Freq: Every day | CUTANEOUS | Status: DC
  Administered 2021-06-11 – 2021-06-20 (×10): 6 via TOPICAL

## 2021-06-10 MED ORDER — SODIUM CHLORIDE 0.9 % IV SOLN
100.0000 mg | Freq: Every day | INTRAVENOUS | Status: AC
  Administered 2021-06-10 – 2021-06-13 (×4): 100 mg via INTRAVENOUS
  Filled 2021-06-10: qty 100
  Filled 2021-06-10: qty 20
  Filled 2021-06-10 (×2): qty 100

## 2021-06-10 MED ORDER — SODIUM CHLORIDE 0.9 % IV SOLN: 100.0000 mL | INTRAVENOUS | Status: DC | PRN

## 2021-06-10 MED ORDER — EPOETIN ALFA 10000 UNIT/ML IJ SOLN
10000.0000 [IU] | INTRAMUSCULAR | Status: DC
Start: 2021-06-10 — End: 2021-06-20
  Administered 2021-06-10 – 2021-06-20 (×5): 10000 [IU] via INTRAVENOUS
  Filled 2021-06-10 (×5): qty 1

## 2021-06-10 MED ORDER — LIDOCAINE HCL (PF) 1 % IJ SOLN
5.0000 mL | INTRAMUSCULAR | Status: DC | PRN
  Filled 2021-06-10: qty 5

## 2021-06-10 MED ORDER — LIDOCAINE-PRILOCAINE 2.5-2.5 % EX CREA
1.0000 "application " | TOPICAL_CREAM | CUTANEOUS | Status: DC | PRN
  Filled 2021-06-10: qty 5

## 2021-06-10 MED ORDER — PENTAFLUOROPROP-TETRAFLUOROETH EX AERO
1.0000 "application " | INHALATION_SPRAY | CUTANEOUS | Status: DC | PRN
  Filled 2021-06-10: qty 30

## 2021-06-10 MED ORDER — FAMOTIDINE 20 MG PO TABS
10.0000 mg | ORAL_TABLET | Freq: Every day | ORAL | Status: DC
  Administered 2021-06-10 – 2021-06-19 (×10): 10 mg via ORAL
  Filled 2021-06-10 (×10): qty 1

## 2021-06-10 MED ORDER — BENZONATATE 100 MG PO CAPS
100.0000 mg | ORAL_CAPSULE | ORAL | Status: DC | PRN
  Administered 2021-06-10 – 2021-06-12 (×2): 100 mg via ORAL
  Filled 2021-06-10 (×3): qty 1

## 2021-06-10 MED ORDER — METOPROLOL TARTRATE 50 MG PO TABS
50.0000 mg | ORAL_TABLET | Freq: Two times a day (BID) | ORAL | Status: DC
  Administered 2021-06-10 – 2021-06-16 (×13): 50 mg via ORAL
  Filled 2021-06-10 (×13): qty 1

## 2021-06-10 MED ORDER — ALTEPLASE 2 MG IJ SOLR
2.0000 mg | Freq: Once | INTRAMUSCULAR | Status: DC | PRN
  Filled 2021-06-10: qty 2

## 2021-06-10 MED ORDER — HEPARIN SODIUM (PORCINE) 1000 UNIT/ML DIALYSIS
1000.0000 [IU] | INTRAMUSCULAR | Status: DC | PRN
  Filled 2021-06-10: qty 1

## 2021-06-10 NOTE — Progress Notes (Signed)
Patient completed dialysis as ordered. Patient arterial line unable to tolerated BFR of 400, Colon Flattery NP notified. 1.5 Liters removed over a 3 hour period. Patient tolerated well. Report given to floor nurse Maretta Bees RN.

## 2021-06-10 NOTE — TOC Progression Note (Signed)
Transition of Care [facility]) - Progression Note    Patient Details  Name: [name] MRN: [id number] Date of Birth: September 04, 1934  Transition of Care [facility]) CM/SW [place], RN Phone Number: 06/10/2021, 3:59 PM  Clinical Narrative:   Attempted to see patient, she was in HD [facility] to follow up         Expected Discharge Plan and Services                                                 Social Determinants of Health (SDOH) Interventions    Readmission Risk Interventions Readmission Risk Prevention Plan 08/06/2020  Transportation Screening Complete  PCP or Specialist Appt within 3-5 Days Complete  HRI or [place] Complete  Social Work Consult for [place] Planning/Counseling Complete  Palliative Care Screening Not Applicable  Medication Review Press photographer) Complete  Some recent data might be hidden

## 2021-06-10 NOTE — Progress Notes (Signed)
[facility]  ROUNDING NOTE   Subjective:   [name] is a 87 y.o. female recently seen in the ED for generalized weakness from recent Covid diagnosis. She was discharged and received dialysis treatment at outpatient clinic. She returned to ED with complains of weakness and after a fall at home. She is admitted under observation for Generalized weakness [R53.1] ESRD on hemodialysis ([place]) [N18.6, Z99.2] COVID-19 virus infection [U07.1]  Patient is known to our clinic and receives outpatient treatment at [facility] under the supervision of Dr Holley Raring. Patient states she has felt weak for a few days. Reports dry cough, but states it has been productive last week. Currently on 4L [place]. Denies nausea, vomiting and diarrhea.    Objective:  Vital signs in last 24 hours:  Temp:  [97.6 F (36.4 C)-98.2 F (36.8 C)] 97.7 F (36.5 C) (09/09 1135) Pulse Rate:  [74-90] 79 (09/09 1135) Resp:  [8-30] 20 (09/09 1135) BP: (114-159)/(54-76) 114/54 (09/09 1135) SpO2:  [97 %-100 %] 100 % (09/09 1135)  Weight change:  Filed Weights   06/09/21 0802  Weight: 86.2 kg    Intake/Output: I/O last 3 completed shifts: In: 440 [P.O.:440] Out: 0    Intake/Output this shift:  No intake/output data recorded.  Physical Exam: General: NAD  Head: Normocephalic, atraumatic. Moist oral mucosal membranes  Eyes: Anicteric  Lungs:  Clear to auscultation, normal effort, [place] O2  Heart: Regular rate and rhythm  Abdomen:  Soft, nontender   Extremities:  no peripheral edema.  Neurologic: Nonfocal, moving all four extremities  Skin: No lesions  Access: Lt AVF    Basic Metabolic Panel: Recent Labs  Lab 06/08/21 0828 06/09/21 0851 06/10/21 0508  NA 133* 133* 133*  K 4.5 4.3 4.1  CL 97* 98 100  CO2 28 24 26   GLUCOSE 105* 85 88  BUN 39* 38* 48*  CREATININE 5.71* 5.24* 6.29*  CALCIUM 7.9* 8.1* 7.4*  MG  --   --  2.0    Liver Function Tests: Recent Labs  Lab 06/08/21 0828  06/10/21 0508  AST 8* 7*  ALT <5 <5  ALKPHOS 90 80  BILITOT 0.7 0.5  PROT 7.9 6.7  ALBUMIN 2.7* 2.2*   Recent Labs  Lab 06/08/21 0828  LIPASE 30   No results for input(s): AMMONIA in the last 168 hours.  CBC: Recent Labs  Lab 06/08/21 0828 06/09/21 0851 06/10/21 0508  WBC 5.4 6.0 4.7  NEUTROABS 3.9  --  2.9  HGB 9.1* 10.0* 8.2*  HCT 30.6* 34.2* 26.9*  MCV 101.0* 102.1* 101.5*  PLT 149* 149* 151    Cardiac Enzymes: No results for input(s): CKTOTAL, CKMB, CKMBINDEX, TROPONINI in the last 168 hours.  BNP: Invalid input(s): POCBNP  CBG: Recent Labs  Lab 06/09/21 1703 06/10/21 0755  GLUCAP 108* 83    Microbiology: Results for orders placed or performed during the hospital encounter of 05/21/21  Resp Panel by RT-PCR (Flu A&B, Covid) Nasopharyngeal Swab     Status: None   Collection Time: 05/21/21  3:45 AM   Specimen: Nasopharyngeal Swab; Nasopharyngeal(NP) swabs in vial transport medium  Result Value Ref Range Status   SARS Coronavirus 2 by RT PCR NEGATIVE NEGATIVE Final    Comment: (NOTE) SARS-CoV-2 target nucleic acids are NOT DETECTED.  The SARS-CoV-2 RNA is generally detectable in upper respiratory specimens during the acute phase of infection. The lowest concentration of SARS-CoV-2 viral copies this assay can detect is 138 copies/mL. A negative result does not  preclude SARS-Cov-2 infection and should not be used as the sole basis for treatment or other patient management decisions. A negative result may occur with  improper specimen collection/handling, submission of specimen other than nasopharyngeal swab, presence of viral mutation(s) within the areas targeted by this assay, and inadequate number of viral copies(<138 copies/mL). A negative result must be combined with clinical observations, patient history, and epidemiological information. The expected result is Negative.  Fact Sheet for Patients:  EntrepreneurPulse.com.au  Fact  Sheet for Healthcare Providers:  IncredibleEmployment.be  This test is no t yet approved or cleared by the [place] FDA and  has been authorized for detection and/or diagnosis of SARS-CoV-2 by FDA under an Emergency Use Authorization (EUA). This EUA will remain  in effect (meaning this test can be used) for the duration of the COVID-19 declaration under Section 564(b)(1) of the Act, 21 U.S.C.section 360bbb-3(b)(1), unless the authorization is terminated  or revoked sooner.       Influenza A by PCR NEGATIVE NEGATIVE Final   Influenza B by PCR NEGATIVE NEGATIVE Final    Comment: (NOTE) The Xpert Xpress SARS-CoV-2/FLU/RSV plus assay is intended as an aid in the diagnosis of influenza from Nasopharyngeal swab specimens and should not be used as a sole basis for treatment. Nasal washings and aspirates are unacceptable for Xpert Xpress SARS-CoV-2/FLU/RSV testing.  Fact Sheet for Patients: EntrepreneurPulse.com.au  Fact Sheet for Healthcare Providers: IncredibleEmployment.be  This test is not yet approved or cleared by the [place] FDA and has been authorized for detection and/or diagnosis of SARS-CoV-2 by FDA under an Emergency Use Authorization (EUA). This EUA will remain in effect (meaning this test can be used) for the duration of the COVID-19 declaration under Section 564(b)(1) of the Act, 21 U.S.C. section 360bbb-3(b)(1), unless the authorization is terminated or revoked.  Performed at [facility], [address] [place], [place] [address]   Culture, blood (routine x 2)     Status: Abnormal   Collection Time: 05/21/21  3:45 AM   Specimen: BLOOD  Result Value Ref Range Status   Specimen Description   Final    BLOOD RIGHT ASSIST CONTROL Performed at [facility], [address], [place], [place] [address]    Special Requests   Final    BOTTLES DRAWN AEROBIC AND ANAEROBIC Blood Culture  adequate volume Performed at [facility], [address] [place], [place] [address]    Culture  Setup Time   Final    Organism ID to follow GRAM NEGATIVE RODS AEROBIC BOTTLE ONLY IN BOTH AEROBIC AND ANAEROBIC BOTTLES CRITICAL RESULT CALLED TO, READ BACK BY AND VERIFIED WITH: SCHEMA HALLAJI AT 7564 ON 05/22/21 BY SS Performed at [facility], [address] [place], [place] [address]    Culture (A)  Final    SPHINGOBACTERIUM MULTIVORUM Standardized susceptibility testing for this organism is not available. Performed at [facility], [place] [address] [place], [place] [address]    Report Status 05/25/2021 FINAL  Final  Blood Culture ID Panel (Reflexed)     Status: None   Collection Time: 05/21/21  3:45 AM  Result Value Ref Range Status   Enterococcus faecalis NOT DETECTED NOT DETECTED Final   Enterococcus Faecium NOT DETECTED NOT DETECTED Final   Listeria monocytogenes NOT DETECTED NOT DETECTED Final   Staphylococcus species NOT DETECTED NOT DETECTED Final   Staphylococcus aureus (BCID) NOT DETECTED NOT DETECTED Final   Staphylococcus epidermidis NOT DETECTED NOT DETECTED Final   Staphylococcus  lugdunensis NOT DETECTED NOT DETECTED Final   Streptococcus species NOT DETECTED NOT DETECTED Final   Streptococcus agalactiae NOT DETECTED NOT DETECTED Final   Streptococcus pneumoniae NOT DETECTED NOT DETECTED Final   Streptococcus pyogenes NOT DETECTED NOT DETECTED Final   A.calcoaceticus-baumannii NOT DETECTED NOT DETECTED Final   Bacteroides fragilis NOT DETECTED NOT DETECTED Final   Enterobacterales NOT DETECTED NOT DETECTED Final   Enterobacter cloacae complex NOT DETECTED NOT DETECTED Final   Escherichia coli NOT DETECTED NOT DETECTED Final   Klebsiella aerogenes NOT DETECTED NOT DETECTED Final   Klebsiella oxytoca NOT DETECTED NOT DETECTED Final   Klebsiella pneumoniae NOT DETECTED NOT DETECTED Final   Proteus species NOT DETECTED NOT DETECTED Final    Salmonella species NOT DETECTED NOT DETECTED Final   Serratia marcescens NOT DETECTED NOT DETECTED Final   Haemophilus influenzae NOT DETECTED NOT DETECTED Final   Neisseria meningitidis NOT DETECTED NOT DETECTED Final   Pseudomonas aeruginosa NOT DETECTED NOT DETECTED Final   Stenotrophomonas maltophilia NOT DETECTED NOT DETECTED Final   Candida albicans NOT DETECTED NOT DETECTED Final   Candida auris NOT DETECTED NOT DETECTED Final   Candida glabrata NOT DETECTED NOT DETECTED Final   Candida krusei NOT DETECTED NOT DETECTED Final   Candida parapsilosis NOT DETECTED NOT DETECTED Final   Candida tropicalis NOT DETECTED NOT DETECTED Final   Cryptococcus neoformans/gattii NOT DETECTED NOT DETECTED Final    Comment: Performed at [facility], [address] [place], [place] [address]  Culture, blood (routine x 2)     Status: Abnormal   Collection Time: 05/21/21  4:05 AM   Specimen: BLOOD  Result Value Ref Range Status   Specimen Description   Final    BLOOD RIGHT HAND Performed at [facility], [address] [place], [place] [address]    Special Requests   Final    BOTTLES DRAWN AEROBIC ONLY Blood Culture results may not be optimal due to an inadequate volume of blood received in culture bottles Performed at [facility], [address] [place], [place] [address]    Culture  Setup Time AEROBIC BOTTLE ONLY GRAM NEGATIVE RODS   Final   Culture (A)  Final    SPHINGOBACTERIUM MULTIVORUM SEE SEPARATE REPORT FOR SUSCEPTIBILITY RESULTS Performed at [employer] Performed at [facility], [address] [place], [place] [address]    Report Status 05/31/2021 FINAL  Final  Susceptibility, Aer + Anaerob     Status: Abnormal   Collection Time: 05/21/21  4:05 AM  Result Value Ref Range Status   Suscept, Aer + Anaerob Final report (A)  Corrected    Comment: (NOTE) Performed At: [facility] [address] [place], [place]  [id number] Rush Farmer MD QI:[id number] CORRECTED ON 08/28 AT 1035: PREVIOUSLY REPORTED AS Preliminary report    Source of Sample   Final    [id number] SUSCEPTIBILITY FOR SPHINGOBACTERIUM MULTIVORUM BLOOD CULTURE    Comment: Performed at [facility], [place] [address], [place], [place] [address]  Susceptibility Result     Status: Abnormal   Collection Time: 05/21/21  4:05 AM  Result Value Ref Range Status   Suscept Result 1 Comment (A)  Final    Comment: (NOTE) Sphingobacterium multivorum Identification performed by account, not confirmed by this laboratory.    Antimicrobial Suscept Comment  Corrected    Comment: (NOTE)      ** S = Susceptible; I = Intermediate; R = Resistant **  P = Positive; N = Negative            MICS are expressed in micrograms per mL   Antibiotic                 RSLT#1    RSLT#2    RSLT#3    RSLT#4 Amikacin                       R Aztreonam                      R Cefepime                       S Cefotaxime                     S Ceftazidime                    S Ceftriaxone                    S Ciprofloxacin                  S Gentamicin                     R Imipenem                       R Levofloxacin                   S Meropenem                      S Piperacillin/Tazobactam        I Ticarcillin/Clavulanate        S Tobramycin                     R Trimethoprim/Sulfa             S Performed At: [facility] [employer] [place], [place] [id number] Rush Farmer MD NG:[id number]   Cath Tip Culture     Status: Abnormal   Collection Time: 05/24/21  1:27 PM   Specimen: Catheter Tip; Other  Result Value Ref Range Status   Specimen Description   Final    CATH TIP Performed at [facility], [address] [place], [place] [address]    Special Requests   Final    NONE Performed at [facility], [address] [place], [place] [address]    Culture (A)  Final    2,000 COLONIES/mL COAGULASE  NEGATIVE STAPHYLOCOCCUS   Report Status 05/26/2021 FINAL  Final    Coagulation Studies: No results for input(s): LABPROT, INR in the last 72 hours.  Urinalysis: No results for input(s): COLORURINE, LABSPEC, PHURINE, GLUCOSEU, HGBUR, BILIRUBINUR, KETONESUR, PROTEINUR, UROBILINOGEN, NITRITE, LEUKOCYTESUR in the last 72 hours.  Invalid input(s): APPERANCEUR    Imaging: No results found.   Medications:    remdesivir 100 mg in NS 100 mL 100 mg (06/10/21 0920)    aspirin  81 mg Oral Q M,W,F   clopidogrel  75 mg Oral Daily   famotidine  20 mg Oral BID   heparin  5,000 Units Subcutaneous Q8H   hydrALAZINE  25 mg Oral TID   insulin aspart  0-5 Units Subcutaneous QHS   insulin aspart  0-6 Units Subcutaneous TID WC  levothyroxine  150 mcg Oral Q0600   metoprolol tartrate  50 mg Oral BID   [START ON 06/11/2021] torsemide  100 mg Oral Once per day on Tue Thu Sat   acetaminophen, ALPRAZolam, benzonatate, chlorpheniramine-HYDROcodone, HYDROcodone-acetaminophen, ondansetron **OR** ondansetron (ZOFRAN) IV  Assessment/ Plan:  [name] is a 87 y.o.  female recently seen in the ED for generalized weakness from recent Covid diagnosis. She was discharged and received dialysis treatment at outpatient clinic. She returned to ED with complains of weakness and after a fall at home. She is admitted under observation for Generalized weakness [R53.1] ESRD on hemodialysis ([place]) [N18.6, Z99.2] COVID-19 virus infection [U07.1]   [facility]/MWF/Lt AVF  Anemia of chronic kidney disease Lab Results  Component Value Date   HGB 8.2 (L) 06/10/2021  Hgb below target Will order EPO with treatments  2. End stage renal disease on dialysis Last hemodialysis treatment on Wednesday Scheduled to receive dialysis later today  3. Secondary Hyperparathyroidism: Lab Results  Component Value Date   PTH 163 (H) 06/25/2020   CALCIUM 7.4 (L) 06/10/2021   CAION 1.11 (L) 11/04/2020   PHOS  4.5 05/23/2021   Will monitor bone mineral levels during dialysis Calcium levels decreased. Will obtain phosphorus with am labs  4. Diabetes mellitus type II with chronic kidney disease Most recent hemoglobin A1c is 4.4 on 05/21/21.  Glucose stable during this admission        LOS: 0 Terez Freimark 9/9/202212:23 PM

## 2021-06-10 NOTE — Progress Notes (Signed)
PROGRESS NOTE    [name]  VFI:[id number] DOB: 01-18-1934 DOA: 06/09/2021 PCP: Idelle Crouch, MD    Assessment & Plan:   Active Problems:   Hyperlipidemia   Hypertension   Breast cancer (HCC)   GERD (gastroesophageal reflux disease)   Coronary artery disease   S/P CABG x 4   Weakness   Pain syndrome, chronic   CAD (coronary artery disease)   Type 2 diabetes with nephropathy (HCC)   HTN, goal below 140/80   CKD (chronic kidney disease) stage 5, GFR less than 15 ml/min (HCC)   Ambulatory dysfunction   Anemia of chronic kidney failure, stage 5 ([place])   Bacteremia due to Staphylococcus   ESRD (end stage renal disease) on dialysis ([place])   COVID-19 virus infection   Hypothyroid   Weakness: PT/OT consulted    COVID-19 infection:  continue on remdesivir and encourage incentive spirometry. Continue on airborne and contact precautions. Evidently tested positive at some outpatient clinic but no records of it so COVID19 test ordered but not done yet, messaged pt's nurse  ESRD: on HD. 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 Completed abx course on 06/08/21   DM2: likely poorly controlled. Continue on SSI w/ accuchecks  Neuropathy: will hold home dose of gabapentin   HTN: continue on home dose of hydralazine   Anxiety: severity unknown. Continue on home dose of xanax   CAD: s/p CABG in December 2013. Continue on home dose of plavix, aspirin   Chronic pain syndrome: continue on home dose of norco    Hypothyroidism: continue on home dose of levothyroxine   DVT prophylaxis: heparin  Code Status: DNR  Family Communication:  Disposition Plan: depends on PT/OT recs   Level of care: Med-Surg  Status is: Inpatient  Remains inpatient appropriate because:Unsafe d/c plan, IV treatments appropriate due to intensity of illness or inability to take PO, and Inpatient level of care appropriate due to severity of illness  Dispo:  The patient is from: Home              Anticipated d/c is to: SNF              Patient currently is not medically stable to d/c.   Difficult to place patient : unclear         Consultants:    Procedures:   Antimicrobials:   Subjective: Pt c/o fatigue   Objective: Vitals:   06/10/21 0500 06/10/21 0531 06/10/21 0600 06/10/21 0753  BP:  (!) 146/64  (!) 152/72  Pulse:  85  81  Resp: (!) 8 17 16 20   Temp:  98 F (36.7 C)  97.8 F (36.6 C)  TempSrc:      SpO2:  97%  100%  Weight:      Height:        Intake/Output Summary (Last 24 hours) at 06/10/2021 0801 Last data filed at 06/10/2021 0600 Gross per 24 hour  Intake 440 ml  Output 0 ml  Net 440 ml   Filed Weights   06/09/21 0802  Weight: 86.2 kg    Examination:  General exam: Appears calm and comfortable  Respiratory system: diminished breath sounds b/l  Cardiovascular system: S1 & S2 +. No rubs, gallops or clicks.  Gastrointestinal system: Abdomen is nondistended, soft and nontender. Normal bowel sounds heard. Central nervous system: Alert and oriented. Moves all extremities  Psychiatry: Judgement and insight appear normal. Flat mood and affect  Data Reviewed: I have personally reviewed following labs and imaging studies  CBC: Recent Labs  Lab 06/08/21 0828 06/09/21 0851 06/10/21 0508  WBC 5.4 6.0 4.7  NEUTROABS 3.9  --  2.9  HGB 9.1* 10.0* 8.2*  HCT 30.6* 34.2* 26.9*  MCV 101.0* 102.1* 101.5*  PLT 149* 149* 174   Basic Metabolic Panel: Recent Labs  Lab 06/08/21 0828 06/09/21 0851 06/10/21 0508  NA 133* 133* 133*  K 4.5 4.3 4.1  CL 97* 98 100  CO2 28 24 26   GLUCOSE 105* 85 88  BUN 39* 38* 48*  CREATININE 5.71* 5.24* 6.29*  CALCIUM 7.9* 8.1* 7.4*  MG  --   --  2.0   GFR: Estimated Creatinine Clearance: 7.1 mL/min (A) (by C-G formula based on SCr of 6.29 mg/dL (H)). Liver Function Tests: Recent Labs  Lab 06/08/21 0828 06/10/21 0508  AST 8* 7*  ALT <5 <5  ALKPHOS 90 80  BILITOT  0.7 0.5  PROT 7.9 6.7  ALBUMIN 2.7* 2.2*   Recent Labs  Lab 06/08/21 0828  LIPASE 30   No results for input(s): AMMONIA in the last 168 hours. Coagulation Profile: No results for input(s): INR, PROTIME in the last 168 hours. Cardiac Enzymes: No results for input(s): CKTOTAL, CKMB, CKMBINDEX, TROPONINI in the last 168 hours. BNP (last 3 results) No results for input(s): PROBNP in the last 8760 hours. HbA1C: No results for input(s): HGBA1C in the last 72 hours. CBG: Recent Labs  Lab 06/09/21 1703 06/10/21 0755  GLUCAP 108* 83   Lipid Profile: No results for input(s): CHOL, HDL, LDLCALC, TRIG, CHOLHDL, LDLDIRECT in the last 72 hours. Thyroid Function Tests: No results for input(s): TSH, T4TOTAL, FREET4, T3FREE, THYROIDAB in the last 72 hours. Anemia Panel: No results for input(s): VITAMINB12, FOLATE, FERRITIN, TIBC, IRON, RETICCTPCT in the last 72 hours. Sepsis Labs: No results for input(s): PROCALCITON, LATICACIDVEN in the last 168 hours.  No results found for this or any previous visit (from the past 240 hour(s)).       Radiology Studies: DG Chest Portable 1 View  Result Date: 06/08/2021 CLINICAL DATA:  COVID-19 positive.  Cough. EXAM: PORTABLE CHEST 1 VIEW COMPARISON:  May 21, 2021. FINDINGS: Stable cardiomegaly. Status post coronary artery bypass graft. No pneumothorax is noted. Stable left basilar opacity is noted concerning for atelectasis or infiltrate with associated pleural effusion. Mildly increased right midlung opacity is noted concerning for worsening atelectasis or possibly infiltrate. Bony thorax is unremarkable. IMPRESSION: Stable left basilar opacity is noted as described above. Mildly increased right midlung opacity is noted concerning for worsening atelectasis or possibly infiltrate. Electronically Signed   By: Marijo Conception M.D.   On: 06/08/2021 09:09        Scheduled Meds:  aspirin  81 mg Oral Q M,W,F   clopidogrel  75 mg Oral Daily    famotidine  20 mg Oral BID   heparin  5,000 Units Subcutaneous Q8H   hydrALAZINE  25 mg Oral TID   insulin aspart  0-5 Units Subcutaneous QHS   insulin aspart  0-6 Units Subcutaneous TID WC   levothyroxine  150 mcg Oral Q0600   metoprolol tartrate  50 mg Oral BID   [START ON 06/11/2021] torsemide  100 mg Oral Once per day on Tue Thu Sat   Continuous Infusions:  ceFEPime (MAXIPIME) IV     remdesivir 100 mg in NS 100 mL       LOS: 0 days    Time spent: 81  mins     Wyvonnia Dusky, MD [facility] Pager [phone]  If 7PM-7AM, please contact night-coverage 06/10/2021, 8:01 AM

## 2021-06-10 NOTE — Evaluation (Addendum)
Physical Therapy Evaluation Patient Details Name: [name] MRN: [id number] DOB: [birth year] Today's Date: 06/10/2021   History of Present Illness  [name] is a 87 y.o. 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   Clinical Impression  Pt admitted with above diagnosis. Pt agreeable to PT services laying supine in bed. Able to report PLOF, DME, home lay out without difficulty. Pt is a limited household ambulator but is able to go out in community for doctor's visits with use of RW. Husband assists daily with dressing ADL's, cooking, cleaning, driving tasks for pt. Pt's hands are cold and pt is wearing thick nail polish. Throughout session very difficult to get accurate SPO2 reading with pulse oximeter. Pt may require ear attachment for accurate reading. ModA+1 with HOB elevated and use of bed rails to sit EOB with extra time to complete. Good static sitting balance with BUE and feet supported. Pt resting on 3L/min via [place] with SPO2 100% and HR low 80's BPM. Pt able to stand to RW with supervision and mod VC"s for hand placement with very heavy reliance on UE's on RW with shuffling, slow gait which pt reports is below baseline. Pt only tolerating 4' of ambulation before returning to sitting EOB. Per PT pulse ox post session, SPO2 recorded at 78% and HR up to 173 BPM. Titrated O2 to 4L/min. Unable to obtain new SPO2 reading. RN notified. HR returning down to mid  80's after ~ 5 minutes rest. ModA+1 to return to supine in bed for LE management. Pt refusing PT to don prevalon foot boots prior to leaving room. LE's elevated and supported with pillows by PT instead.  Pt very deconditioned as evidenced by HR response to very limited walking with heavy reliance of UE's on RW. Would benefit from STR to improve strength/endurance prior to transitioning to home environment.     Follow Up Recommendations SNF;Supervision for mobility/OOB    Equipment Recommendations  Other (comment) (next venue of care)    Recommendations for Other Services       Precautions / Restrictions Precautions Precautions: Fall Restrictions Weight Bearing Restrictions: No      Mobility  Bed Mobility Overal bed mobility: Needs Assistance Bed Mobility: Supine to Sit;Sit to Supine     Supine to sit: Mod assist;HOB elevated Sit to supine: Mod assist   General bed mobility comments: Use of bed rails Patient Response: Cooperative  Transfers Overall transfer level: Needs assistance Equipment used: Rolling walker (2 wheeled) Transfers: Sit to/from Stand Sit to Stand: From elevated surface;Min assist            Ambulation/Gait Ambulation/Gait assistance: Min guard Gait Distance (Feet): 4 Feet Assistive device: Rolling walker (2 wheeled) Gait Pattern/deviations: Shuffle;Trunk flexed     General Gait Details: Heavy reliance on RW with UE's. Limited by fatigue and SOB  Stairs            Wheelchair Mobility    Modified Rankin (Stroke Patients Only)       Balance Overall balance assessment: Needs assistance Sitting-balance support: Bilateral upper extremity supported;Feet supported Sitting balance-Leahy Scale: Fair Sitting balance - Comments: Can maintain static sitting balance  without external support.     Standing balance-Leahy Scale: Fair                               Pertinent Vitals/Pain Pain Assessment: No/denies pain    Home  Living Family/patient expects to be discharged to:: Private residence Living Arrangements: Spouse/significant other Available Help at Discharge: Family;Available 24 hours/day;Other (Comment) Type of Home: House Home Access: Stairs to enter Entrance Stairs-Rails: Right Entrance Stairs-Number of Steps: 2 Home Layout: One level Home Equipment: Walker - 2 wheels;Bedside commode;Shower seat;Grab bars - tub/shower      Prior Function Level of Independence: Needs assistance   Gait / Transfers Assistance Needed: Use of RW at baseline. LImited household ambulator.  ADL's / Homemaking Assistance Needed: Husband assists as needed per pt.  Comments: reports 1 recent fall. Limited household ambulation performed using RW. Husband provide transportation     Hand Dominance        Extremity/Trunk Assessment   Upper Extremity Assessment Upper Extremity Assessment: Generalized weakness;Defer to OT evaluation    Lower Extremity Assessment Lower Extremity Assessment: Generalized weakness    Cervical / Trunk Assessment Cervical / Trunk Assessment: Kyphotic  Communication   Communication: No difficulties  Cognition Arousal/Alertness: Awake/alert Behavior During Therapy: WFL for tasks assessed/performed Overall Cognitive Status: Within Functional Limits for tasks assessed                                        General Comments General comments (skin integrity, edema, etc.): HR up to 179 BPM with mobility (4' with RW). Unable to get accurate SPO2 due to cold fingers/nail polish? Seated rest HR returned to mid 80's.    Exercises Other Exercises Other Exercises: Role of acute care PT, D/C recs   Assessment/Plan    PT Assessment Patient needs continued PT services  PT Problem List Decreased strength;Decreased balance;Decreased mobility;Decreased activity tolerance;Obesity       PT Treatment Interventions Gait training;Stair training;Therapeutic exercise;Balance  training;Functional mobility training;Therapeutic activities;Patient/family education;Neuromuscular re-education;DME instruction    PT Goals (Current goals can be found in the Care Plan section)  Acute Rehab PT Goals Patient Stated Goal: go home PT Goal Formulation: With patient Time For Goal Achievement: 06/24/21 Potential to Achieve Goals: Fair    Frequency Min 2X/week   Barriers to discharge        Co-evaluation               AM-PAC PT "6 Clicks" Mobility  Outcome Measure Help needed turning from your back to your side while in a flat bed without using bedrails?: A Lot Help needed moving from lying on your back to sitting on the side of a flat bed without using bedrails?: A Lot Help needed moving to and from a bed to a chair (including a wheelchair)?: A Lot Help needed standing up from a chair using your arms (e.g., wheelchair or bedside chair)?: A Lot Help needed to walk in hospital room?: A Lot Help needed climbing 3-5 steps with a railing? : Total 6 Click Score: 11    End of Session Equipment Utilized During Treatment: Gait belt;Oxygen Activity Tolerance: Patient tolerated treatment well Patient left: in bed;with bed alarm set Nurse Communication: Mobility status PT Visit Diagnosis: Muscle weakness (generalized) (M62.81);Difficulty in walking, not elsewhere classified (R26.2)    Time: 5643-3295 PT Time Calculation (min) (ACUTE  ONLY): 48 min   Charges:   PT Evaluation $PT Eval High Complexity: 1 High PT Treatments $Gait Training: 8-22 mins $Therapeutic Activity: 23-37 mins       Eyoel Throgmorton M. Fairly IV, PT, DPT Physical Therapist- [facility]  06/10/2021, 12:50 PM

## 2021-06-10 NOTE — Progress Notes (Signed)
OT Cancellation Note  Patient Details Name: [name] MRN: [id number] DOB: 1934-07-08   Cancelled Treatment:    Reason Eval/Treat Not Completed: Patient at procedure or test/ unavailable. Consult received, chart reviewed. Pt out of the room for dialysis upon attempt. Will re-attempt at later date/time as pt is available.   Hanley Hays, MPH, MS, OTR/L ascom [phone] 06/10/21, 3:24 PM

## 2021-06-11 LAB — CBC
HCT: 32.2 % — ABNORMAL LOW (ref 36.0–46.0)
Hemoglobin: 9.8 g/dL — ABNORMAL LOW (ref 12.0–15.0)
MCH: 30.2 pg (ref 26.0–34.0)
MCHC: 30.4 g/dL (ref 30.0–36.0)
MCV: 99.4 fL (ref 80.0–100.0)
Platelets: 175 10*3/uL (ref 150–400)
RBC: 3.24 MIL/uL — ABNORMAL LOW (ref 3.87–5.11)
RDW: 16.8 % — ABNORMAL HIGH (ref 11.5–15.5)
WBC: 4.6 10*3/uL (ref 4.0–10.5)
nRBC: 0 % (ref 0.0–0.2)

## 2021-06-11 LAB — COMPREHENSIVE METABOLIC PANEL
ALT: 6 U/L (ref 0–44)
AST: 8 U/L — ABNORMAL LOW (ref 15–41)
Albumin: 2.6 g/dL — ABNORMAL LOW (ref 3.5–5.0)
Alkaline Phosphatase: 89 U/L (ref 38–126)
Anion gap: 8 (ref 5–15)
BUN: 35 mg/dL — ABNORMAL HIGH (ref 8–23)
CO2: 29 mmol/L (ref 22–32)
Calcium: 7.9 mg/dL — ABNORMAL LOW (ref 8.9–10.3)
Chloride: 99 mmol/L (ref 98–111)
Creatinine, Ser: 4.76 mg/dL — ABNORMAL HIGH (ref 0.44–1.00)
GFR, Estimated: 9 mL/min — ABNORMAL LOW (ref >60)
Glucose, Bld: 95 mg/dL (ref 70–99)
Potassium: 3.8 mmol/L (ref 3.5–5.1)
Sodium: 136 mmol/L (ref 135–145)
Total Bilirubin: 0.7 mg/dL (ref 0.3–1.2)
Total Protein: 7.5 g/dL (ref 6.5–8.1)

## 2021-06-11 LAB — GLUCOSE, CAPILLARY
Glucose-Capillary: 74 mg/dL (ref 70–99)
Glucose-Capillary: 98 mg/dL (ref 70–99)

## 2021-06-11 LAB — D-DIMER, QUANTITATIVE: D-Dimer, Quant: 1.78 ug/mL-FEU — ABNORMAL HIGH (ref 0.00–0.50)

## 2021-06-11 LAB — SARS CORONAVIRUS 2 (TAT 6-24 HRS): SARS Coronavirus 2: POSITIVE — AB

## 2021-06-11 LAB — C-REACTIVE PROTEIN: CRP: 6.6 mg/dL — ABNORMAL HIGH (ref <1.0)

## 2021-06-11 MED ORDER — GABAPENTIN 100 MG PO CAPS
100.0000 mg | ORAL_CAPSULE | Freq: Two times a day (BID) | ORAL | Status: DC
  Administered 2021-06-11 – 2021-06-16 (×10): 100 mg via ORAL
  Filled 2021-06-11 (×12): qty 1

## 2021-06-11 NOTE — Progress Notes (Signed)
[facility]  ROUNDING NOTE   Subjective:   [name] is a 87 y.o. female recently seen in the ED for generalized weakness from recent Covid diagnosis. She was discharged and received dialysis treatment at outpatient clinic. She returned to ED with complains of weakness and after a fall at home. She is admitted under observation for Generalized weakness [R53.1] ESRD on hemodialysis ([place]) [N18.6, Z99.2] COVID-19 virus infection [U07.1]  Patient is known to our clinic and receives outpatient treatment at [facility] under the supervision of Dr Holley Raring. Patient states she has felt weak for a few days. Reports dry cough, but states it has been productive last week. Currently on 4L [place]. Denies nausea, vomiting and diarrhea.   Patient was seen today on first floor. Patient offers no new specific physical complaints.    Objective:  Vital signs in last 24 hours:  Temp:  [97.4 F (36.3 C)-98.6 F (37 C)] 97.7 F (36.5 C) (09/10 1241) Pulse Rate:  [70-96] 77 (09/10 1241) Resp:  [14-33] 16 (09/10 1241) BP: (120-152)/(50-72) 129/58 (09/10 1241) SpO2:  [96 %-100 %] 100 % (09/10 1241)  Weight change:  Filed Weights   06/09/21 0802  Weight: 86.2 kg    Intake/Output: I/O last 3 completed shifts: In: 680 [P.O.:680] Out: 1600 [Urine:100; Other:1500]   Intake/Output this shift:  No intake/output data recorded.  Physical Exam: General: NAD  Head: Normocephalic, atraumatic. Moist oral mucosal membranes  Eyes: Anicteric  Lungs:  Clear to auscultation, normal effort, [place] O2  Heart: Regular rate and rhythm  Abdomen:  Soft, nontender   Extremities:  no peripheral edema.  Neurologic: Nonfocal, moving all four extremities  Skin: No lesions  Access: Lt AVF    Basic Metabolic Panel: Recent Labs  Lab 06/08/21 0828 06/09/21 0851 06/10/21 0508 06/11/21 0610  NA 133* 133* 133* 136  K 4.5 4.3 4.1 3.8  CL 97* 98 100 99  CO2 28 24 26 29   GLUCOSE 105* 85 88 95   BUN 39* 38* 48* 35*  CREATININE 5.71* 5.24* 6.29* 4.76*  CALCIUM 7.9* 8.1* 7.4* 7.9*  MG  --   --  2.0  --     Liver Function Tests: Recent Labs  Lab 06/08/21 0828 06/10/21 0508 06/11/21 0610  AST 8* 7* 8*  ALT <5 <5 6  ALKPHOS 90 80 89  BILITOT 0.7 0.5 0.7  PROT 7.9 6.7 7.5  ALBUMIN 2.7* 2.2* 2.6*   Recent Labs  Lab 06/08/21 0828  LIPASE 30   No results for input(s): AMMONIA in the last 168 hours.  CBC: Recent Labs  Lab 06/08/21 0828 06/09/21 0851 06/10/21 0508 06/11/21 0610  WBC 5.4 6.0 4.7 4.6  NEUTROABS 3.9  --  2.9  --   HGB 9.1* 10.0* 8.2* 9.8*  HCT 30.6* 34.2* 26.9* 32.2*  MCV 101.0* 102.1* 101.5* 99.4  PLT 149* 149* 151 175    Cardiac Enzymes: No results for input(s): CKTOTAL, CKMB, CKMBINDEX, TROPONINI in the last 168 hours.  BNP: Invalid input(s): POCBNP  CBG: Recent Labs  Lab 06/10/21 0755 06/10/21 1306 06/10/21 2241 06/11/21 0911 06/11/21 1215  GLUCAP 83 91 109* 74 98    Microbiology: Results for orders placed or performed during the hospital encounter of 06/09/21  SARS CORONAVIRUS 2 (TAT 6-24 HRS) Nasopharyngeal Nasopharyngeal Swab     Status: Abnormal   Collection Time: 06/09/21 12:24 PM   Specimen: Nasopharyngeal Swab  Result Value Ref Range Status   SARS Coronavirus 2 POSITIVE (A) NEGATIVE Final  Comment: (NOTE) SARS-CoV-2 target nucleic acids are DETECTED.  The SARS-CoV-2 RNA is generally detectable in upper and lower respiratory specimens during the acute phase of infection. Positive results are indicative of the presence of SARS-CoV-2 RNA. Clinical correlation with patient history and other diagnostic information is  necessary to determine patient infection status. Positive results do not rule out bacterial infection or co-infection with other viruses.  The expected result is Negative.  Fact Sheet for Patients: SugarRoll.be  Fact Sheet for Healthcare  Providers: https://www.woods-mathews.com/  This test is not yet approved or cleared by the [place] FDA and  has been authorized for detection and/or diagnosis of SARS-CoV-2 by FDA under an Emergency Use Authorization (EUA). This EUA will remain  in effect (meaning this test can be used) for the duration of the COVID-19 declaration under Section 564(b)(1) of the Act, 21 U. S.C. section 360bbb-3(b)(1), unless the authorization is terminated or revoked sooner.   Performed at [facility], [place] [address] [place], [place] [address]     Coagulation Studies: No results for input(s): LABPROT, INR in the last 72 hours.  Urinalysis: No results for input(s): COLORURINE, LABSPEC, PHURINE, GLUCOSEU, HGBUR, BILIRUBINUR, KETONESUR, PROTEINUR, UROBILINOGEN, NITRITE, LEUKOCYTESUR in the last 72 hours.  Invalid input(s): APPERANCEUR    Imaging: No results found.   Medications:    remdesivir 100 mg in NS 100 mL 100 mg (06/11/21 1020)    aspirin  81 mg Oral Q M,W,F   Chlorhexidine Gluconate Cloth  6 each Topical Q0600   clopidogrel  75 mg Oral Daily   epoetin (EPOGEN/PROCRIT) injection  10,000 Units Intravenous Q M,W,F-HD   famotidine  10 mg Oral Daily   gabapentin  100 mg Oral BID   heparin  5,000 Units Subcutaneous Q8H   hydrALAZINE  25 mg Oral TID   levothyroxine  150 mcg Oral Q0600   metoprolol tartrate  50 mg Oral BID   torsemide  100 mg Oral Once per day on Tue Thu Sat   acetaminophen, ALPRAZolam, benzonatate, chlorpheniramine-HYDROcodone, HYDROcodone-acetaminophen, ondansetron **OR** ondansetron (ZOFRAN) IV  Assessment/ Plan:  [name] is a 87 y.o.  female recently seen in the ED for generalized weakness from recent Covid diagnosis. She was discharged and received dialysis treatment at outpatient clinic. She returned to ED with complains of weakness and after a fall at home. She is admitted under observation for Generalized weakness  [R53.1] ESRD on hemodialysis ([place]) [N18.6, Z99.2] COVID-19 virus infection [U07.1]   [facility]/MWF/Lt AVF  Impression   Anemia of chronic kidney disease Lab Results  Component Value Date   HGB 9.8 (L) 06/11/2021  Hgb is at goal ( 9--11) Will order EPO with treatments  2. End stage renal disease on dialysis Patient is a hemodialysis Patient is a Monday Wednesday Friday schedule Patient was last dialyzed yesterday No need for hemodialysis today  3. Secondary Hyperparathyroidism: Lab Results  Component Value Date   PTH 163 (H) 06/25/2020   CALCIUM 7.9 (L) 06/11/2021   CAION 1.11 (L) 11/04/2020   PHOS 4.5 05/23/2021   Will monitor bone mineral levels during dialysis Calcium levels decreased. Will obtain phosphorus with am labs  4. Diabetes mellitus type II with chronic kidney disease Most recent hemoglobin A1c is 4.4 on 05/21/21.  Glucose stable during this admission     Plan No need for renal placement therapy today   LOS: 1 [place] [facility] 9/10/20221:14 PM

## 2021-06-11 NOTE — Progress Notes (Signed)
PROGRESS NOTE    [name]  FGH:[id number] DOB: 11-26-1933 DOA: 06/09/2021 PCP: Idelle Crouch, MD    Assessment & Plan:   Active Problems:   Hyperlipidemia   Hypertension   Breast cancer (HCC)   GERD (gastroesophageal reflux disease)   Coronary artery disease   S/P CABG x 4   Weakness   Pain syndrome, chronic   CAD (coronary artery disease)   Type 2 diabetes with nephropathy (HCC)   HTN, goal below 140/80   CKD (chronic kidney disease) stage 5, GFR less than 15 ml/min (HCC)   Ambulatory dysfunction   Anemia of chronic kidney failure, stage 5 ([place])   Bacteremia due to Staphylococcus   ESRD (end stage renal disease) on dialysis ([place])   COVID-19 virus infection   Hypothyroid   Generalized weakness   Weakness: PT/OT recs SNF but pt refuses and is agreeable to home health.   COVID-19 infection:  continue on remdesivir and encourage incentive spirometry. Continue on airborne and contact precautions. Evidently tested positive at some outpatient clinic but no records of it so COVID19 test ordered but not done yet, messaged pt's nurse  Acute on chronic hypoxic respiratory failure: likely secondary to [place]. Continue on supplemental oxygen and wean back to baseline as tolerated which is 2L [place]. Encourage incentive spirometry   ESRD: on HD. 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 Completed abx course on 06/08/21   DM2: well controlled w/ HbA1c 4.4. Will d/c SSI w/ accuchecks as BS have been WNL.   Neuropathy: will restart home dose of gabapentin    HTN: continue on home dose of hydralazine   Anxiety: severity unknown. Continue on home dose of xanax   CAD: s/p CABG in December 2013. Continue on home dose of plavix, aspirin   Chronic pain syndrome: continue on home dose of norco    Hypothyroidism: continue on home dose of levothyroxine   DVT prophylaxis: heparin  Code Status: DNR  Family Communication:  Disposition  Plan: likely d/c home w/ HH   Level of care: Med-Surg  Status is: Inpatient  Remains inpatient appropriate because:Unsafe d/c plan, IV treatments appropriate due to intensity of illness or inability to take PO, and Inpatient level of care appropriate due to severity of illness  Dispo: The patient is from: Home              Anticipated d/c is to: [facility]              Patient currently is not medically stable to d/c.   Difficult to place patient : unclear         Consultants:    Procedures:   Antimicrobials:   Subjective: Pt c/o malaise   Objective: Vitals:   06/10/21 2148 06/10/21 2225 06/11/21 0050 06/11/21 0626  BP: (!) 123/55 (!) 141/63 128/60 (!) 146/63  Pulse: 87 86 70 88  Resp: 18  18 19   Temp: (!) 97.4 F (36.3 C)  97.8 F (36.6 C) 97.8 F (36.6 C)  TempSrc:      SpO2: 97%  99% 96%  Weight:      Height:        Intake/Output Summary (Last 24 hours) at 06/11/2021 0809 Last data filed at 06/11/2021 0100 Gross per 24 hour  Intake 240 ml  Output 1600 ml  Net -1360 ml   Filed Weights   06/09/21 0802  Weight: 86.2 kg    Examination:  General exam: Appears comfortable  Respiratory system: decreased breath sounds b/l. No rales  Cardiovascular system: S1/S2+. No rubs or clicks  Gastrointestinal system: Abd is soft, NT, obese & hypoactive bowel sounds  Central nervous system: Alert and oriented. Moves all extremities  Psychiatry: Judgement & insight appear normal. Flat mood and affect    Data Reviewed: I have personally reviewed following labs and imaging studies  CBC: Recent Labs  Lab 06/08/21 0828 06/09/21 0851 06/10/21 0508 06/11/21 0610  WBC 5.4 6.0 4.7 4.6  NEUTROABS 3.9  --  2.9  --   HGB 9.1* 10.0* 8.2* 9.8*  HCT 30.6* 34.2* 26.9* 32.2*  MCV 101.0* 102.1* 101.5* 99.4  PLT 149* 149* 151 027   Basic Metabolic Panel: Recent Labs  Lab 06/08/21 0828 06/09/21 0851 06/10/21 0508 06/11/21 0610  NA 133* 133* 133* 136  K 4.5 4.3 4.1 3.8   CL 97* 98 100 99  CO2 28 24 26 29   GLUCOSE 105* 85 88 95  BUN 39* 38* 48* 35*  CREATININE 5.71* 5.24* 6.29* 4.76*  CALCIUM 7.9* 8.1* 7.4* 7.9*  MG  --   --  2.0  --    GFR: Estimated Creatinine Clearance: 9.3 mL/min (A) (by C-G formula based on SCr of 4.76 mg/dL (H)). Liver Function Tests: Recent Labs  Lab 06/08/21 0828 06/10/21 0508 06/11/21 0610  AST 8* 7* 8*  ALT <5 <5 6  ALKPHOS 90 80 89  BILITOT 0.7 0.5 0.7  PROT 7.9 6.7 7.5  ALBUMIN 2.7* 2.2* 2.6*   Recent Labs  Lab 06/08/21 0828  LIPASE 30   No results for input(s): AMMONIA in the last 168 hours. Coagulation Profile: No results for input(s): INR, PROTIME in the last 168 hours. Cardiac Enzymes: No results for input(s): CKTOTAL, CKMB, CKMBINDEX, TROPONINI in the last 168 hours. BNP (last 3 results) No results for input(s): PROBNP in the last 8760 hours. HbA1C: No results for input(s): HGBA1C in the last 72 hours. CBG: Recent Labs  Lab 06/09/21 1703 06/10/21 0755 06/10/21 1306 06/10/21 2241  GLUCAP 108* 83 91 109*   Lipid Profile: No results for input(s): CHOL, HDL, LDLCALC, TRIG, CHOLHDL, LDLDIRECT in the last 72 hours. Thyroid Function Tests: No results for input(s): TSH, T4TOTAL, FREET4, T3FREE, THYROIDAB in the last 72 hours. Anemia Panel: Recent Labs    06/10/21 0508  VITAMINB12 1,140*   Sepsis Labs: No results for input(s): PROCALCITON, LATICACIDVEN in the last 168 hours.  Recent Results (from the past 240 hour(s))  SARS CORONAVIRUS 2 (TAT 6-24 HRS) Nasopharyngeal Nasopharyngeal Swab     Status: Abnormal   Collection Time: 06/09/21 12:24 PM   Specimen: Nasopharyngeal Swab  Result Value Ref Range Status   SARS Coronavirus 2 POSITIVE (A) NEGATIVE Final    Comment: (NOTE) SARS-CoV-2 target nucleic acids are DETECTED.  The SARS-CoV-2 RNA is generally detectable in upper and lower respiratory specimens during the acute phase of infection. Positive results are indicative of the presence of  SARS-CoV-2 RNA. Clinical correlation with patient history and other diagnostic information is  necessary to determine patient infection status. Positive results do not rule out bacterial infection or co-infection with other viruses.  The expected result is Negative.  Fact Sheet for Patients: SugarRoll.be  Fact Sheet for Healthcare Providers: https://www.woods-mathews.com/  This test is not yet approved or cleared by the [place] FDA and  has been authorized for detection and/or diagnosis of SARS-CoV-2 by FDA under an Emergency Use Authorization (EUA). This EUA will remain  in effect (meaning this test can  be used) for the duration of the COVID-19 declaration under Section 564(b)(1) of the Act, 21 U. S.C. section 360bbb-3(b)(1), unless the authorization is terminated or revoked sooner.   Performed at [facility], [place] [address], [place], [place] [address]          Radiology Studies: No results found.      Scheduled Meds:  aspirin  81 mg Oral Q M,W,F   Chlorhexidine Gluconate Cloth  6 each Topical Q0600   clopidogrel  75 mg Oral Daily   epoetin (EPOGEN/PROCRIT) injection  10,000 Units Intravenous Q M,W,F-HD   famotidine  10 mg Oral Daily   heparin  5,000 Units Subcutaneous Q8H   hydrALAZINE  25 mg Oral TID   insulin aspart  0-5 Units Subcutaneous QHS   insulin aspart  0-6 Units Subcutaneous TID WC   levothyroxine  150 mcg Oral Q0600   metoprolol tartrate  50 mg Oral BID   torsemide  100 mg Oral Once per day on Tue Thu Sat   Continuous Infusions:  remdesivir 100 mg in NS 100 mL 100 mg (06/10/21 0920)     LOS: 1 day    Time spent: 32 mins     Wyvonnia Dusky, MD [facility] Pager [phone]  If 7PM-7AM, please contact night-coverage 06/11/2021, 8:09 AM

## 2021-06-11 NOTE — Evaluation (Signed)
Occupational Therapy Evaluation Patient Details Name: [name] MRN: [id number] DOB: 04-11-1934 Today's Date: 06/11/2021    History of Present Illness [name] is a 87 y.o. 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   Clinical Impression   [name] presents today with generalized weakness, limited endurance, and SOB. She denies pain. She normally uses 2L 02 at home; today she is on 4L O2 via [place]. She is able to perform bed mobility, transfers, and limited ambulation this AM with CGA, keeping SpO2 levels in upper 80s-low 90s with OOB movement. Mod A for LB dressing, toileting, although she reports this is her baseline: her husband assists her with these ADL tasks at home. Pt demonstrates fair static standing balance; poor dynamic standing balance. Recommend ongoing OT while hospitalized, with HHOT post DC to assist pt with regaining strength and endurance and improving balance and ability to more INDly perform ADLs and fxl mobility.    Follow Up Recommendations  Home health OT    Equipment Recommendations  None recommended by OT    Recommendations for Other Services       Precautions / Restrictions Precautions Precautions: Fall Precaution Comments: airborne/contact precautions Restrictions Weight Bearing Restrictions: No      Mobility Bed Mobility Overal bed mobility: Needs Assistance Bed Mobility: Supine to Sit;Sit to Supine      Supine to sit: HOB elevated;Min guard Sit to supine: Min guard;HOB elevated   General bed mobility comments: increased time, effort, use of bed rails    Transfers Overall transfer level: Needs assistance Equipment used: Rolling walker (2 wheeled) Transfers: Sit to/from Stand Sit to Stand: Min guard              Balance Overall balance assessment: Needs assistance Sitting-balance support: Bilateral upper extremity supported;Feet supported Sitting balance-Leahy Scale: Good Sitting balance - Comments: Can maintain static sitting balance without external support.   Standing balance support: Bilateral upper extremity supported Standing balance-Leahy Scale: Fair                             ADL either performed or assessed with clinical judgement   ADL Overall ADL's : Needs assistance/impaired Eating/Feeding: Modified independent               Upper Body Dressing : Supervision/safety Upper Body Dressing Details (indicate cue type and reason): increased time/effort Lower Body Dressing: Moderate assistance   Toilet Transfer: Moderate assistance                   Vision         Perception     Praxis      Pertinent Vitals/Pain Pain Assessment: No/denies pain     Hand Dominance Right   Extremity/Trunk Assessment Upper Extremity Assessment Upper Extremity Assessment: Generalized weakness   Lower Extremity Assessment Lower Extremity Assessment: Generalized weakness       Communication Communication Communication: No difficulties   Cognition Arousal/Alertness: Awake/alert Behavior During Therapy: WFL for tasks assessed/performed Overall Cognitive Status: Within Functional Limits for tasks assessed  General Comments       Exercises Other Exercises Other Exercises: Role of acute care OT, D/C recs, ECS, LB dressing   Shoulder Instructions      Home Living Family/patient expects  to be discharged to:: Private residence Living Arrangements: Spouse/significant other Available Help at Discharge: Family;Available 24 hours/day Type of Home: House Home Access: Stairs to enter [employer] of Steps: 2 Entrance Stairs-Rails: Right Home Layout: One level     Bathroom Shower/Tub:  (Pt does not use bathtub: takes sponge baths only)   Bathroom Toilet: Standard     Home Equipment: Walker - 2 wheels;Bedside commode;Shower seat   Additional Comments: reports using only BSC for toileting      Prior Functioning/Environment Level of Independence: Needs assistance  Gait / Transfers Assistance Needed: Use of RW at baseline. Limited household ambulator. ADL's / Homemaking Assistance Needed: Husband assists with LB dressing, toileting, driving, shopping. Pt does cooking.   Comments: reports 1 recent fall. Limited household ambulation performed using RW. Husband provides transportation        OT Problem List: Decreased strength;Impaired balance (sitting and/or standing);Decreased activity tolerance      OT Treatment/Interventions: Self-care/ADL training;DME and/or AE instruction;Therapeutic activities;Balance training;Therapeutic exercise;Energy conservation;Patient/family education    OT Goals(Current goals can be found in the care plan section) Acute Rehab OT Goals Patient Stated Goal: go home OT Goal Formulation: With patient Time For Goal Achievement: 06/25/21 Potential to Achieve Goals: Good ADL Goals Pt Will Perform Lower Body Dressing: sitting/lateral leans;with min assist (using AE as needed) Pt Will Transfer to Toilet: with supervision;stand pivot transfer;regular height toilet (using LRAD) Additional ADL Goal #1: Pt will be able to identify/demonstrate 2+ energy conservation techniques  OT Frequency: Min 1X/week   Barriers to D/C:            Co-evaluation              AM-PAC OT "6 Clicks" Daily Activity     Outcome Measure Help from  another person eating meals?: None Help from another person taking care of personal grooming?: A Little Help from another person toileting, which includes using toliet, bedpan, or urinal?: A Little Help from another person bathing (including washing, rinsing, drying)?: A Little Help from another person to put on and taking off regular upper body clothing?: A Little Help from another person to put on and taking off regular lower body clothing?: A Lot 6 Click Score: 18   End of Session Equipment Utilized During Treatment: Rolling walker  Activity Tolerance: Patient tolerated treatment well Patient left: in bed;with call bell/phone within reach;with bed alarm set;with nursing/sitter in room  OT Visit Diagnosis: Unsteadiness on feet (R26.81);Muscle weakness (generalized) (M62.81)                Time: 7322-0254 OT Time Calculation (min): 21 min Charges:  OT General Charges $OT Visit: 1 Visit OT Evaluation $OT Eval Low Complexity: 1 Low OT Treatments $Self Care/Home Management : 8-22 mins Josiah Lobo, PhD, MS, OTR/L 06/11/21, 11:16 AM

## 2021-06-12 DIAGNOSIS — I10 Essential (primary) hypertension: Secondary | ICD-10-CM

## 2021-06-12 LAB — COMPREHENSIVE METABOLIC PANEL
ALT: 5 U/L (ref 0–44)
AST: <5 U/L — ABNORMAL LOW (ref 15–41)
Albumin: 2 g/dL — ABNORMAL LOW (ref 3.5–5.0)
Alkaline Phosphatase: 77 U/L (ref 38–126)
Anion gap: 11 (ref 5–15)
BUN: 47 mg/dL — ABNORMAL HIGH (ref 8–23)
CO2: 24 mmol/L (ref 22–32)
Calcium: 7.6 mg/dL — ABNORMAL LOW (ref 8.9–10.3)
Chloride: 99 mmol/L (ref 98–111)
Creatinine, Ser: 5.92 mg/dL — ABNORMAL HIGH (ref 0.44–1.00)
GFR, Estimated: 7 mL/min — ABNORMAL LOW (ref >60)
Glucose, Bld: 80 mg/dL (ref 70–99)
Potassium: 4.2 mmol/L (ref 3.5–5.1)
Sodium: 134 mmol/L — ABNORMAL LOW (ref 135–145)
Total Bilirubin: 0.7 mg/dL (ref 0.3–1.2)
Total Protein: 6.2 g/dL — ABNORMAL LOW (ref 6.5–8.1)

## 2021-06-12 LAB — CBC
HCT: 30.7 % — ABNORMAL LOW (ref 36.0–46.0)
Hemoglobin: 9.1 g/dL — ABNORMAL LOW (ref 12.0–15.0)
MCH: 29.8 pg (ref 26.0–34.0)
MCHC: 29.6 g/dL — ABNORMAL LOW (ref 30.0–36.0)
MCV: 100.7 fL — ABNORMAL HIGH (ref 80.0–100.0)
Platelets: 172 10*3/uL (ref 150–400)
RBC: 3.05 MIL/uL — ABNORMAL LOW (ref 3.87–5.11)
RDW: 16.9 % — ABNORMAL HIGH (ref 11.5–15.5)
WBC: 5.5 10*3/uL (ref 4.0–10.5)
nRBC: 0 % (ref 0.0–0.2)

## 2021-06-12 LAB — D-DIMER, QUANTITATIVE: D-Dimer, Quant: 1.2 ug/mL-FEU — ABNORMAL HIGH (ref 0.00–0.50)

## 2021-06-12 LAB — C-REACTIVE PROTEIN: CRP: 9 mg/dL — ABNORMAL HIGH (ref <1.0)

## 2021-06-12 NOTE — Progress Notes (Signed)
[facility]  ROUNDING NOTE   Subjective:   [name] is a 87 y.o. female recently seen in the ED for generalized weakness from recent Covid diagnosis. She was discharged and received dialysis treatment at outpatient clinic. She returned to ED with complains of weakness and after a fall at home. She is admitted under observation for Generalized weakness [R53.1] ESRD on hemodialysis ([place]) [N18.6, Z99.2] COVID-19 virus infection [U07.1]  Patient is known to our clinic and receives outpatient treatment at [facility] under the supervision of Dr Holley Raring. Patient states she has felt weak for a few days. Reports dry cough, but states it has been productive last week. Currently on 4L [place]. Denies nausea, vomiting and diarrhea.   Patient was seen today on first floor. Patient offers no complaint of shortness of breath or chest pain.    Objective:  Vital signs in last 24 hours:  Temp:  [97.4 F (36.3 C)-98 F (36.7 C)] 97.6 F (36.4 C) (09/11 0810) Pulse Rate:  [71-84] 76 (09/11 0810) Resp:  [16-20] 20 (09/11 0810) BP: (107-143)/(50-60) 128/50 (09/11 0810) SpO2:  [94 %-100 %] 98 % (09/11 0810)  Weight change:  Filed Weights   06/09/21 0802  Weight: 86.2 kg    Intake/Output: I/O last 3 completed shifts: In: 340 [P.O.:340] Out: 100 [Urine:100]   Intake/Output this shift:  No intake/output data recorded.  Physical Exam: General: NAD  Head: Normocephalic, atraumatic. Moist oral mucosal membranes  Eyes: Anicteric  Lungs:  Clear to auscultation, normal effort, [place] O2  Heart: Regular rate and rhythm  Abdomen:  Soft, nontender   Extremities:  no peripheral edema.  Neurologic: Nonfocal, moving all four extremities  Skin: No lesions  Access: Lt AVF    Basic Metabolic Panel: Recent Labs  Lab 06/08/21 0828 06/09/21 0851 06/10/21 0508 06/11/21 0610 06/12/21 0525  NA 133* 133* 133* 136 134*  K 4.5 4.3 4.1 3.8 4.2  CL 97* 98 100 99 99  CO2 28 24 26 29  24   GLUCOSE 105* 85 88 95 80  BUN 39* 38* 48* 35* 47*  CREATININE 5.71* 5.24* 6.29* 4.76* 5.92*  CALCIUM 7.9* 8.1* 7.4* 7.9* 7.6*  MG  --   --  2.0  --   --     Liver Function Tests: Recent Labs  Lab 06/08/21 0828 06/10/21 0508 06/11/21 0610 06/12/21 0525  AST 8* 7* 8* <5*  ALT <5 5 6 5   ALKPHOS 90 80 89 77  BILITOT 0.7 0.5 0.7 0.7  PROT 7.9 6.7 7.5 6.2*  ALBUMIN 2.7* 2.2* 2.6* 2.0*   Recent Labs  Lab 06/08/21 0828  LIPASE 30   No results for input(s): AMMONIA in the last 168 hours.  CBC: Recent Labs  Lab 06/08/21 0828 06/09/21 0851 06/10/21 0508 06/11/21 0610 06/12/21 0525  WBC 5.4 6.0 4.7 4.6 5.5  NEUTROABS 3.9  --  2.9  --   --   HGB 9.1* 10.0* 8.2* 9.8* 9.1*  HCT 30.6* 34.2* 26.9* 32.2* 30.7*  MCV 101.0* 102.1* 101.5* 99.4 100.7*  PLT 149* 149* 151 175 172    Cardiac Enzymes: No results for input(s): CKTOTAL, CKMB, CKMBINDEX, TROPONINI in the last 168 hours.  BNP: Invalid input(s): POCBNP  CBG: Recent Labs  Lab 06/10/21 0755 06/10/21 1306 06/10/21 2241 06/11/21 0911 06/11/21 1215  GLUCAP 83 91 109* 40 98    Microbiology: Results for orders placed or performed during the hospital encounter of 06/09/21  SARS CORONAVIRUS 2 (TAT 6-24 HRS) Nasopharyngeal Nasopharyngeal  Swab     Status: Abnormal   Collection Time: 06/09/21 12:24 PM   Specimen: Nasopharyngeal Swab  Result Value Ref Range Status   SARS Coronavirus 2 POSITIVE (A) NEGATIVE Final    Comment: (NOTE) SARS-CoV-2 target nucleic acids are DETECTED.  The SARS-CoV-2 RNA is generally detectable in upper and lower respiratory specimens during the acute phase of infection. Positive results are indicative of the presence of SARS-CoV-2 RNA. Clinical correlation with patient history and other diagnostic information is  necessary to determine patient infection status. Positive results do not rule out bacterial infection or co-infection with other viruses.  The expected result is  Negative.  Fact Sheet for Patients: SugarRoll.be  Fact Sheet for Healthcare Providers: https://www.woods-mathews.com/  This test is not yet approved or cleared by the [place] FDA and  has been authorized for detection and/or diagnosis of SARS-CoV-2 by FDA under an Emergency Use Authorization (EUA). This EUA will remain  in effect (meaning this test can be used) for the duration of the COVID-19 declaration under Section 564(b)(1) of the Act, 21 U. S.C. section 360bbb-3(b)(1), unless the authorization is terminated or revoked sooner.   Performed at [facility], [place] [address] [place], [place] [address]     Coagulation Studies: No results for input(s): LABPROT, INR in the last 72 hours.  Urinalysis: No results for input(s): COLORURINE, LABSPEC, PHURINE, GLUCOSEU, HGBUR, BILIRUBINUR, KETONESUR, PROTEINUR, UROBILINOGEN, NITRITE, LEUKOCYTESUR in the last 72 hours.  Invalid input(s): APPERANCEUR    Imaging: No results found.   Medications:    remdesivir 100 mg in NS 100 mL 100 mg (06/11/21 1020)    aspirin  81 mg Oral Q M,W,F   Chlorhexidine Gluconate Cloth  6 each Topical Q0600   clopidogrel  75 mg Oral Daily   epoetin (EPOGEN/PROCRIT) injection  10,000 Units Intravenous Q M,W,F-HD   famotidine  10 mg Oral Daily   gabapentin  100 mg Oral BID   heparin  5,000 Units Subcutaneous Q8H   hydrALAZINE  25 mg Oral TID   levothyroxine  150 mcg Oral Q0600   metoprolol tartrate  50 mg Oral BID   torsemide  100 mg Oral Once per day on Tue Thu Sat   acetaminophen, ALPRAZolam, benzonatate, chlorpheniramine-HYDROcodone, HYDROcodone-acetaminophen, ondansetron **OR** ondansetron (ZOFRAN) IV  Assessment/ Plan:  [name] is a 87 y.o.  female recently seen in the ED for generalized weakness from recent Covid diagnosis. She was discharged and received dialysis treatment at outpatient clinic. She returned to ED with  complains of weakness and after a fall at home. She is admitted under observation for Generalized weakness [R53.1] ESRD on hemodialysis ([place]) [N18.6, Z99.2] COVID-19 virus infection [U07.1]   [facility]/MWF/Lt AVF  Impression   Anemia of chronic kidney disease Lab Results  Component Value Date   HGB 9.1 (L) 06/12/2021  Hgb is at goal ( 9--11) On EPO with treatments  2. End stage renal disease on dialysis Patient is a hemodialysis Patient is a Monday Wednesday Friday schedule Patient was last dialyzed on Friday  No need for hemodialysis today  3. Secondary Hyperparathyroidism: Lab Results  Component Value Date   PTH 163 (H) 06/25/2020   CALCIUM 7.6 (L) 06/12/2021   CAION 1.11 (L) 11/04/2020   PHOS 4.5 05/23/2021   Will monitor bone mineral levels during dialysis Calcium levels decreased. Will obtain phosphorus with am labs  4. Diabetes mellitus type II with chronic kidney disease Most recent hemoglobin A1c is 4.4 on 05/21/21.  Glucose stable during this admission     Plan   No need for renal placement therapy today Will dialyze in am     LOS: 2 [facility] 9/11/20229:45 AM

## 2021-06-12 NOTE — Progress Notes (Signed)
PROGRESS NOTE    [name]  IEP:[id number] DOB: 08-13-1934 DOA: 06/09/2021 PCP: Idelle Crouch, MD    Assessment & Plan:   Active Problems:   Hyperlipidemia   Hypertension   Breast cancer (HCC)   GERD (gastroesophageal reflux disease)   Coronary artery disease   S/P CABG x 4   Weakness   Pain syndrome, chronic   CAD (coronary artery disease)   Type 2 diabetes with nephropathy (HCC)   HTN, goal below 140/80   CKD (chronic kidney disease) stage 5, GFR less than 15 ml/min (HCC)   Ambulatory dysfunction   Anemia of chronic kidney failure, stage 5 ([place])   Bacteremia due to Staphylococcus   ESRD (end stage renal disease) on dialysis ([place])   COVID-19 virus infection   Hypothyroid   Generalized weakness   Weakness: PT/OT recs SNF but pt refuses and is agreeable to home health.   COVID-19 infection: continue on IV remdesivir and encourage incentive spirometry. Continue on airborne and contact precautions. COVID19 test here was positive as well   Acute on chronic hypoxic respiratory failure: likely secondary to [place]. Weaned back to baseline oxygen today at 2 L [place].   ESRD: on HD. 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 Completed abx course on 06/08/21   DM2: well controlled w/ HbA1c 4.4. Diet controlled.   Neuropathy: continue on home dose of gabapentin   HTN: continue on home dose of hydralazine   Anxiety: severity unknown. Continue on home dose of xanax   CAD: s/p CABG in December 2013. Continue on home dose of plavix, aspirin   Chronic pain syndrome: continue on home dose norco    Hypothyroidism: continue on home dose of levothyroxine   DVT prophylaxis: heparin  Code Status: DNR  Family Communication:  Disposition Plan: likely d/c home w/ HH   Level of care: Med-Surg  Status is: Inpatient  Remains inpatient appropriate because:Unsafe d/c plan, IV treatments appropriate due to intensity of illness or  inability to take PO, and Inpatient level of care appropriate due to severity of illness, can likely d/c home in 24 hours after HD   Dispo: The patient is from: Home              Anticipated d/c is to: [facility]              Patient currently is not medically stable to d/c.   Difficult to place patient : unclear         Consultants:    Procedures:   Antimicrobials:   Subjective: Pt c/o intermittent cough   Objective: Vitals:   06/11/21 2308 06/11/21 2322 06/12/21 0031 06/12/21 0530  BP: 130/60  (!) 107/52 (!) 114/51  Pulse:  84 73 71  Resp:   16 16  Temp:   97.8 F (36.6 C) (!) 97.4 F (36.3 C)  TempSrc:      SpO2:   94% 99%  Weight:      Height:        Intake/Output Summary (Last 24 hours) at 06/12/2021 0741 Last data filed at 06/12/2021 0700 Gross per 24 hour  Intake 220 ml  Output --  Net 220 ml   Filed Weights   06/09/21 0802  Weight: 86.2 kg    Examination:  General exam: Appears calm but uncomfortable  Respiratory system: course breath sounds b/l  Cardiovascular system: S1 & S2+. No rubs or clicks  Gastrointestinal system: Abd is soft, NT, obese &  normal bowel sounds Central nervous system: Alert and oriented. Moves all extremities  Psychiatry: Judgement and insight appear normal. Flat mood and affect    Data Reviewed: I have personally reviewed following labs and imaging studies  CBC: Recent Labs  Lab 06/08/21 0828 06/09/21 0851 06/10/21 0508 06/11/21 0610 06/12/21 0525  WBC 5.4 6.0 4.7 4.6 5.5  NEUTROABS 3.9  --  2.9  --   --   HGB 9.1* 10.0* 8.2* 9.8* 9.1*  HCT 30.6* 34.2* 26.9* 32.2* 30.7*  MCV 101.0* 102.1* 101.5* 99.4 100.7*  PLT 149* 149* 151 175 761   Basic Metabolic Panel: Recent Labs  Lab 06/08/21 0828 06/09/21 0851 06/10/21 0508 06/11/21 0610 06/12/21 0525  NA 133* 133* 133* 136 134*  K 4.5 4.3 4.1 3.8 4.2  CL 97* 98 100 99 99  CO2 28 24 26 29 24   GLUCOSE 105* 85 88 95 80  BUN 39* 38* 48* 35* 47*  CREATININE 5.71*  5.24* 6.29* 4.76* 5.92*  CALCIUM 7.9* 8.1* 7.4* 7.9* 7.6*  MG  --   --  2.0  --   --    GFR: Estimated Creatinine Clearance: 7.5 mL/min (A) (by C-G formula based on SCr of 5.92 mg/dL (H)). Liver Function Tests: Recent Labs  Lab 06/08/21 0828 06/10/21 0508 06/11/21 0610 06/12/21 0525  AST 8* 7* 8* <5*  ALT <5 5 6 5   ALKPHOS 90 80 89 77  BILITOT 0.7 0.5 0.7 0.7  PROT 7.9 6.7 7.5 6.2*  ALBUMIN 2.7* 2.2* 2.6* 2.0*   Recent Labs  Lab 06/08/21 0828  LIPASE 30   No results for input(s): AMMONIA in the last 168 hours. Coagulation Profile: No results for input(s): INR, PROTIME in the last 168 hours. Cardiac Enzymes: No results for input(s): CKTOTAL, CKMB, CKMBINDEX, TROPONINI in the last 168 hours. BNP (last 3 results) No results for input(s): PROBNP in the last 8760 hours. HbA1C: No results for input(s): HGBA1C in the last 72 hours. CBG: Recent Labs  Lab 06/10/21 0755 06/10/21 1306 06/10/21 2241 06/11/21 0911 06/11/21 1215  GLUCAP 83 91 109* 74 98   Lipid Profile: No results for input(s): CHOL, HDL, LDLCALC, TRIG, CHOLHDL, LDLDIRECT in the last 72 hours. Thyroid Function Tests: No results for input(s): TSH, T4TOTAL, FREET4, T3FREE, THYROIDAB in the last 72 hours. Anemia Panel: Recent Labs    06/10/21 0508  VITAMINB12 1,140*   Sepsis Labs: No results for input(s): PROCALCITON, LATICACIDVEN in the last 168 hours.  Recent Results (from the past 240 hour(s))  SARS CORONAVIRUS 2 (TAT 6-24 HRS) Nasopharyngeal Nasopharyngeal Swab     Status: Abnormal   Collection Time: 06/09/21 12:24 PM   Specimen: Nasopharyngeal Swab  Result Value Ref Range Status   SARS Coronavirus 2 POSITIVE (A) NEGATIVE Final    Comment: (NOTE) SARS-CoV-2 target nucleic acids are DETECTED.  The SARS-CoV-2 RNA is generally detectable in upper and lower respiratory specimens during the acute phase of infection. Positive results are indicative of the presence of SARS-CoV-2 RNA.  Clinical correlation with patient history and other diagnostic information is  necessary to determine patient infection status. Positive results do not rule out bacterial infection or co-infection with other viruses.  The expected result is Negative.  Fact Sheet for Patients: SugarRoll.be  Fact Sheet for Healthcare Providers: https://www.woods-mathews.com/  This test is not yet approved or cleared by the [place] FDA and  has been authorized for detection and/or diagnosis of SARS-CoV-2 by FDA under an Emergency Use Authorization (EUA). This EUA will  remain  in effect (meaning this test can be used) for the duration of the COVID-19 declaration under Section 564(b)(1) of the Act, 21 U. S.C. section 360bbb-3(b)(1), unless the authorization is terminated or revoked sooner.   Performed at [facility], [place] [address], [place],  [address]          Radiology Studies: No results found.      Scheduled Meds:  aspirin  81 mg Oral Q M,W,F   Chlorhexidine Gluconate Cloth  6 each Topical Q0600   clopidogrel  75 mg Oral Daily   epoetin (EPOGEN/PROCRIT) injection  10,000 Units Intravenous Q M,W,F-HD   famotidine  10 mg Oral Daily   gabapentin  100 mg Oral BID   heparin  5,000 Units Subcutaneous Q8H   hydrALAZINE  25 mg Oral TID   levothyroxine  150 mcg Oral Q0600   metoprolol tartrate  50 mg Oral BID   torsemide  100 mg Oral Once per day on Tue Thu Sat   Continuous Infusions:  remdesivir 100 mg in NS 100 mL 100 mg (06/11/21 1020)     LOS: 2 days    Time spent: 30 mins     Wyvonnia Dusky, MD [facility] Pager [phone]  If 7PM-7AM, please contact night-coverage 06/12/2021, 7:41 AM

## 2021-06-12 NOTE — TOC Transition Note (Signed)
Transition of Care [facility]) - CM/SW Discharge Note   Patient Details  Name: [name] MRN: [id number] Date of Birth: Feb 08, 1934  Transition of Care [facility]) CM/SW Contact:  Kerin Salen, RN Phone Number: 06/12/2021, 3:50 PM   Clinical Narrative:  Called and spoke with patients husband, who says he is on quarantine until Tues. Not feeling well, will visit and speak with doctor about discharge plans then. Spoke with RN working with patient who reports patient being confused today, thought she was home talking with Husband, however re-oriented easily. Patient also on Isolation due to covid, positive on 06/09/21.           Patient Goals and CMS Choice        Discharge Placement                       Discharge Plan and Services                                     Social Determinants of Health (SDOH) Interventions     Readmission Risk Interventions Readmission Risk Prevention Plan 08/06/2020  Transportation Screening Complete  PCP or Specialist Appt within 3-5 Days Complete  HRI or [place] Complete  Social Work Consult for [place] Planning/Counseling Complete  Palliative Care Screening Not Applicable  Medication Review [employer]) Complete  Some recent data might be hidden

## 2021-06-13 LAB — CBC
HCT: 32 % — ABNORMAL LOW (ref 36.0–46.0)
Hemoglobin: 9.9 g/dL — ABNORMAL LOW (ref 12.0–15.0)
MCH: 30.7 pg (ref 26.0–34.0)
MCHC: 30.9 g/dL (ref 30.0–36.0)
MCV: 99.1 fL (ref 80.0–100.0)
Platelets: 220 10*3/uL (ref 150–400)
RBC: 3.23 MIL/uL — ABNORMAL LOW (ref 3.87–5.11)
RDW: 16.5 % — ABNORMAL HIGH (ref 11.5–15.5)
WBC: 5.9 10*3/uL (ref 4.0–10.5)
nRBC: 0 % (ref 0.0–0.2)

## 2021-06-13 LAB — COMPREHENSIVE METABOLIC PANEL
ALT: 6 U/L (ref 0–44)
AST: <5 U/L — ABNORMAL LOW (ref 15–41)
Albumin: 2.3 g/dL — ABNORMAL LOW (ref 3.5–5.0)
Alkaline Phosphatase: 93 U/L (ref 38–126)
Anion gap: 9 (ref 5–15)
BUN: 62 mg/dL — ABNORMAL HIGH (ref 8–23)
CO2: 25 mmol/L (ref 22–32)
Calcium: 7.4 mg/dL — ABNORMAL LOW (ref 8.9–10.3)
Chloride: 98 mmol/L (ref 98–111)
Creatinine, Ser: 6.95 mg/dL — ABNORMAL HIGH (ref 0.44–1.00)
GFR, Estimated: 5 mL/min — ABNORMAL LOW (ref >60)
Glucose, Bld: 93 mg/dL (ref 70–99)
Potassium: 4.6 mmol/L (ref 3.5–5.1)
Sodium: 132 mmol/L — ABNORMAL LOW (ref 135–145)
Total Bilirubin: 0.7 mg/dL (ref 0.3–1.2)
Total Protein: 7.1 g/dL (ref 6.5–8.1)

## 2021-06-13 LAB — C-REACTIVE PROTEIN: CRP: 10.9 mg/dL — ABNORMAL HIGH (ref <1.0)

## 2021-06-13 LAB — D-DIMER, QUANTITATIVE: D-Dimer, Quant: 1.49 ug/mL-FEU — ABNORMAL HIGH (ref 0.00–0.50)

## 2021-06-13 NOTE — Progress Notes (Signed)
[facility]  ROUNDING NOTE   Subjective:   Seen and examined on hemodialysis treatment.     HEMODIALYSIS FLOWSHEET:  Blood Flow Rate (mL/min): 400 mL/min Arterial Pressure (mmHg): -220 mmHg Venous Pressure (mmHg): 170 mmHg Transmembrane Pressure (mmHg): 70 mmHg Ultrafiltration Rate (mL/min): 1000 mL/min Dialysate Flow Rate (mL/min): 500 ml/min Conductivity: Machine : 13.6 Conductivity: Machine : 13.6 Dialysis Fluid Bolus: Normal Saline Bolus Amount (mL): 200 mL    Objective:  Vital signs in last 24 hours:  Temp:  [97.6 F (36.4 C)-98 F (36.7 C)] 98 F (36.7 C) (09/12 0923) Pulse Rate:  [76-82] 76 (09/12 0950) Resp:  [16-181] 16 (09/12 0923) BP: (115-131)/(48-58) 122/48 (09/12 0950) SpO2:  [90 %-96 %] 96 % (09/12 0923)  Weight change:  Filed Weights   06/09/21 0802  Weight: 86.2 kg    Intake/Output: I/O last 3 completed shifts: In: 533.3 [P.O.:220; IV Piggyback:313.3] Out: 100 [Urine:100]   Intake/Output this shift:  No intake/output data recorded.  Physical Exam: General: NAD  Head: Normocephalic, atraumatic. Moist oral mucosal membranes  Eyes: Anicteric  Lungs:  Clear to auscultation, normal effort,  O2  Heart: Regular rate and rhythm  Abdomen:  Soft, nontender   Extremities:  no peripheral edema.  Neurologic: Nonfocal, moving all four extremities  Skin: No lesions  Access: Lt AVF    Basic Metabolic Panel: Recent Labs  Lab 06/09/21 0851 06/10/21 0508 06/11/21 0610 06/12/21 0525 06/13/21 0515  NA 133* 133* 136 134* 132*  K 4.3 4.1 3.8 4.2 4.6  CL 98 100 99 99 98  CO2 24 26 29 24 25   GLUCOSE 85 88 95 80 93  BUN 38* 48* 35* 47* 62*  CREATININE 5.24* 6.29* 4.76* 5.92* 6.95*  CALCIUM 8.1* 7.4* 7.9* 7.6* 7.4*  MG  --  2.0  --   --   --      Liver Function Tests: Recent Labs  Lab 06/08/21 0828 06/10/21 0508 06/11/21 0610 06/12/21 0525 06/13/21 0515  AST 8* 7* 8* <5* <5*  ALT <5 5 6 5 6   ALKPHOS 90 80 89 77 93   BILITOT 0.7 0.5 0.7 0.7 0.7  PROT 7.9 6.7 7.5 6.2* 7.1  ALBUMIN 2.7* 2.2* 2.6* 2.0* 2.3*    Recent Labs  Lab 06/08/21 0828  LIPASE 30    No results for input(s): AMMONIA in the last 168 hours.  CBC: Recent Labs  Lab 06/08/21 0828 06/09/21 0851 06/10/21 0508 06/11/21 0610 06/12/21 0525 06/13/21 0515  WBC 5.4 6.0 4.7 4.6 5.5 5.9  NEUTROABS 3.9  --  2.9  --   --   --   HGB 9.1* 10.0* 8.2* 9.8* 9.1* 9.9*  HCT 30.6* 34.2* 26.9* 32.2* 30.7* 32.0*  MCV 101.0* 102.1* 101.5* 99.4 100.7* 99.1  PLT 149* 149* 151 175 172 220     Cardiac Enzymes: No results for input(s): CKTOTAL, CKMB, CKMBINDEX, TROPONINI in the last 168 hours.  BNP: Invalid input(s): POCBNP  CBG: Recent Labs  Lab 06/10/21 0755 06/10/21 1306 06/10/21 2241 06/11/21 0911 06/11/21 1215  GLUCAP 83 91 109* 74 98     Microbiology: Results for orders placed or performed during the hospital encounter of 06/09/21  SARS CORONAVIRUS 2 (TAT 6-24 HRS) Nasopharyngeal Nasopharyngeal Swab     Status: Abnormal   Collection Time: 06/09/21 12:24 PM   Specimen: Nasopharyngeal Swab  Result Value Ref Range Status   SARS Coronavirus 2 POSITIVE (A) NEGATIVE Final    Comment: (NOTE) SARS-CoV-2 target nucleic acids are DETECTED.  The SARS-CoV-2 RNA is generally detectable in upper and lower respiratory specimens during the acute phase of infection. Positive results are indicative of the presence of SARS-CoV-2 RNA. Clinical correlation with patient history and other diagnostic information is  necessary to determine patient infection status. Positive results do not rule out bacterial infection or co-infection with other viruses.  The expected result is Negative.  Fact Sheet for Patients: SugarRoll.be  Fact Sheet for Healthcare Providers: https://www.woods-mathews.com/  This test is not yet approved or cleared by the [place] FDA and  has been authorized for detection  and/or diagnosis of SARS-CoV-2 by FDA under an Emergency Use Authorization (EUA). This EUA will remain  in effect (meaning this test can be used) for the duration of the COVID-19 declaration under Section 564(b)(1) of the Act, 21 U. S.C. section 360bbb-3(b)(1), unless the authorization is terminated or revoked sooner.   Performed at [facility], [place] [address] [place], [place] [address]     Coagulation Studies: No results for input(s): LABPROT, INR in the last 72 hours.  Urinalysis: No results for input(s): COLORURINE, LABSPEC, PHURINE, GLUCOSEU, HGBUR, BILIRUBINUR, KETONESUR, PROTEINUR, UROBILINOGEN, NITRITE, LEUKOCYTESUR in the last 72 hours.  Invalid input(s): APPERANCEUR    Imaging: No results found.   Medications:      aspirin  81 mg Oral Q M,W,F   Chlorhexidine Gluconate Cloth  6 each Topical Q0600   clopidogrel  75 mg Oral Daily   epoetin (EPOGEN/PROCRIT) injection  10,000 Units Intravenous Q M,W,F-HD   famotidine  10 mg Oral Daily   gabapentin  100 mg Oral BID   heparin  5,000 Units Subcutaneous Q8H   hydrALAZINE  25 mg Oral TID   levothyroxine  150 mcg Oral Q0600   metoprolol tartrate  50 mg Oral BID   torsemide  100 mg Oral Once per day on Tue Thu Sat   ALPRAZolam, benzonatate, chlorpheniramine-HYDROcodone, HYDROcodone-acetaminophen, ondansetron **OR** ondansetron (ZOFRAN) IV  Assessment/ Plan:  [name] is a 87 y.o. 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 Continue MWF  schedule.   Hypertension: current regimen of metoprolol, hydralazine and torsemide.   Anemia of chronic kidney disease: hemoglobin 9.9  Secondary Hyperparathyroidism: not currently on binders.     LOS: 3 [name] 9/12/20222:12 PM

## 2021-06-13 NOTE — Progress Notes (Deleted)
Received MD order to discharge patient to home, review home meds, discharge instructions  and prescriptions with patient and patient verbalized understanding, gave MD note for work per patient request

## 2021-06-13 NOTE — Progress Notes (Addendum)
PROGRESS NOTE    Sarah GRAMAJO  IFO:[id number] DOB: Dec 11, 1933 DOA: 06/09/2021 PCP: Idelle Crouch, MD    Assessment & Plan:   Active Problems:   Hyperlipidemia   Hypertension   Breast cancer (HCC)   GERD (gastroesophageal reflux disease)   Coronary artery disease   S/P CABG x 4   Weakness   Pain syndrome, chronic   CAD (coronary artery disease)   Type 2 diabetes with nephropathy (HCC)   HTN, goal below 140/80   CKD (chronic kidney disease) stage 5, GFR less than 15 ml/min (HCC)   Ambulatory dysfunction   Anemia of chronic kidney failure, stage 5 ([place])   Bacteremia due to Staphylococcus   ESRD (end stage renal disease) on dialysis ([place])   COVID-19 virus infection   Hypothyroid   Generalized weakness   Weakness: PT/OT recs SNF and initially pt refused SNF but pt is no agreeable. CM is aware   COVID-19 pneumonia: completed remdesivir course. Continue on bronchodilators and encourage incentive spirometry. Continue on airborne and contact precautions  Acute on chronic hypoxic respiratory failure: likely secondary to [place]. Weaned oxygen back to baseline which is 2L [place], currently pt is on 3L     ESRD: on HD. 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 Completed abx course on 06/08/21   DM2: HbA1c 4.4, well controlled. Diet controlled  Neuropathy: continue on home dose of gabapentin   HTN: continue on home dose of hydralazine   Anxiety: severity unknown. Continue on home dose of xanax   CAD: s/p CABG in December 2013. Continue on home dose of plavix, aspirin   Chronic pain syndrome: continue on home dose of norco    Hypothyroidism: continue on home dose of levothyroxine   Deep tissue injury and unstageable pressure injury left anterior foot: present on admission. Continue w/ wound care  Chronic combined CHF: fluid management via HD. Monitor I/Os   DVT prophylaxis: heparin  Code Status: DNR  Family Communication:  discussed pt's care w/ pt's husband, Bud, and answered his questions  Disposition Plan: d/c to SNF   Level of care: Med-Surg  Status is: Inpatient  Remains inpatient appropriate because:Unsafe d/c plan, IV treatments appropriate due to intensity of illness or inability to take PO, and Inpatient level of care appropriate due to severity of illness agreed to go to SNF today, CM is aware  Dispo: The patient is from: Home              Anticipated d/c is to: SNF              Patient currently is not medically stable to d/c.   Difficult to place patient : unclear         Consultants:    Procedures:   Antimicrobials:   Subjective: Pt c/o generalized weakness  Objective: Vitals:   06/12/21 1210 06/12/21 1622 06/12/21 2122 06/13/21 0453  BP: (!) 116/53 (!) 131/55 (!) 122/58 (!) 115/48  Pulse: 75 77 82 77  Resp: 20 18 (!) 181 16  Temp: 98.4 F (36.9 C) 98 F (36.7 C) 97.7 F (36.5 C) 97.6 F (36.4 C)  TempSrc:   Oral   SpO2: 100%  95% 90%  Weight:      Height:        Intake/Output Summary (Last 24 hours) at 06/13/2021 0746 Last data filed at 06/12/2021 1700 Gross per 24 hour  Intake 313.29 ml  Output 100 ml  Net 213.29 ml  Filed Weights   06/09/21 0802  Weight: 86.2 kg    Examination:  General exam: Appears uncomfortable  Respiratory system: diminished breath sounds b/l  Cardiovascular system: S1/S2+. No rubs or clicks  Gastrointestinal system: Abd is soft, NT, obese & hypoactive bowel sounds  Central nervous system: Alert and awake. Moves all extremities   Psychiatry: Judgement and insight appear poor. Flat mood and affect     Data Reviewed: I have personally reviewed following labs and imaging studies  CBC: Recent Labs  Lab 06/08/21 0828 06/09/21 0851 06/10/21 0508 06/11/21 0610 06/12/21 0525 06/13/21 0515  WBC 5.4 6.0 4.7 4.6 5.5 5.9  NEUTROABS 3.9  --  2.9  --   --   --   HGB 9.1* 10.0* 8.2* 9.8* 9.1* 9.9*  HCT 30.6* 34.2* 26.9* 32.2*  30.7* 32.0*  MCV 101.0* 102.1* 101.5* 99.4 100.7* 99.1  PLT 149* 149* 151 175 172 144   Basic Metabolic Panel: Recent Labs  Lab 06/09/21 0851 06/10/21 0508 06/11/21 0610 06/12/21 0525 06/13/21 0515  NA 133* 133* 136 134* 132*  K 4.3 4.1 3.8 4.2 4.6  CL 98 100 99 99 98  CO2 24 26 29 24 25   GLUCOSE 85 88 95 80 93  BUN 38* 48* 35* 47* 62*  CREATININE 5.24* 6.29* 4.76* 5.92* 6.95*  CALCIUM 8.1* 7.4* 7.9* 7.6* 7.4*  MG  --  2.0  --   --   --    GFR: Estimated Creatinine Clearance: 6.4 mL/min (A) (by C-G formula based on SCr of 6.95 mg/dL (H)). Liver Function Tests: Recent Labs  Lab 06/08/21 0828 06/10/21 0508 06/11/21 0610 06/12/21 0525 06/13/21 0515  AST 8* 7* 8* <5* <5*  ALT <5 5 6 5 6   ALKPHOS 90 80 89 77 93  BILITOT 0.7 0.5 0.7 0.7 0.7  PROT 7.9 6.7 7.5 6.2* 7.1  ALBUMIN 2.7* 2.2* 2.6* 2.0* 2.3*   Recent Labs  Lab 06/08/21 0828  LIPASE 30   No results for input(s): AMMONIA in the last 168 hours. Coagulation Profile: No results for input(s): INR, PROTIME in the last 168 hours. Cardiac Enzymes: No results for input(s): CKTOTAL, CKMB, CKMBINDEX, TROPONINI in the last 168 hours. BNP (last 3 results) No results for input(s): PROBNP in the last 8760 hours. HbA1C: No results for input(s): HGBA1C in the last 72 hours. CBG: Recent Labs  Lab 06/10/21 0755 06/10/21 1306 06/10/21 2241 06/11/21 0911 06/11/21 1215  GLUCAP 83 91 109* 74 98   Lipid Profile: No results for input(s): CHOL, HDL, LDLCALC, TRIG, CHOLHDL, LDLDIRECT in the last 72 hours. Thyroid Function Tests: No results for input(s): TSH, T4TOTAL, FREET4, T3FREE, THYROIDAB in the last 72 hours. Anemia Panel: No results for input(s): VITAMINB12, FOLATE, FERRITIN, TIBC, IRON, RETICCTPCT in the last 72 hours.  Sepsis Labs: No results for input(s): PROCALCITON, LATICACIDVEN in the last 168 hours.  Recent Results (from the past 240 hour(s))  SARS CORONAVIRUS 2 (TAT 6-24 HRS) Nasopharyngeal  Nasopharyngeal Swab     Status: Abnormal   Collection Time: 06/09/21 12:24 PM   Specimen: Nasopharyngeal Swab  Result Value Ref Range Status   SARS Coronavirus 2 POSITIVE (A) NEGATIVE Final    Comment: (NOTE) SARS-CoV-2 target nucleic acids are DETECTED.  The SARS-CoV-2 RNA is generally detectable in upper and lower respiratory specimens during the acute phase of infection. Positive results are indicative of the presence of SARS-CoV-2 RNA. Clinical correlation with patient history and other diagnostic information is  necessary to determine patient infection status.  Positive results do not rule out bacterial infection or co-infection with other viruses.  The expected result is Negative.  Fact Sheet for Patients: SugarRoll.be  Fact Sheet for Healthcare Providers: https://www.woods-mathews.com/  This test is not yet approved or cleared by the [place] FDA and  has been authorized for detection and/or diagnosis of SARS-CoV-2 by FDA under an Emergency Use Authorization (EUA). This EUA will remain  in effect (meaning this test can be used) for the duration of the COVID-19 declaration under Section 564(b)(1) of the Act, 21 U. S.C. section 360bbb-3(b)(1), unless the authorization is terminated or revoked sooner.   Performed at [facility], [place] [address] [place], [place] [address]          Radiology Studies: No results found.      Scheduled Meds:  aspirin  81 mg Oral Q M,W,F   Chlorhexidine Gluconate Cloth  6 each Topical Q0600   clopidogrel  75 mg Oral Daily   epoetin (EPOGEN/PROCRIT) injection  10,000 Units Intravenous Q M,W,F-HD   famotidine  10 mg Oral Daily   gabapentin  100 mg Oral BID   heparin  5,000 Units Subcutaneous Q8H   hydrALAZINE  25 mg Oral TID   levothyroxine  150 mcg Oral Q0600   metoprolol tartrate  50 mg Oral BID   torsemide  100 mg Oral Once per day on Tue Thu Sat   Continuous Infusions:   remdesivir 100 mg in NS 100 mL 100 mg (06/12/21 1022)     LOS: 3 days    Time spent: 25 mins     Wyvonnia Dusky, MD [facility] Pager [phone]  If 7PM-7AM, please contact night-coverage 06/13/2021, 7:46 AM

## 2021-06-13 NOTE — Progress Notes (Signed)
Tolerated 2.5 hrs of HD treatment , cannulated LAVF without difficulty using 15g needle.  UF goal of 2L met.  Report given to primary nurse

## 2021-06-13 NOTE — Progress Notes (Signed)
Physical Therapy Treatment Patient Details Name: [name] MRN: [id number] DOB: 1933/12/07 Today's Date: 06/13/2021   History of Present Illness [name] is a 87 y.o. 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    PT Comments    Pt received supine in bed agreeable to PT services. RN student attending session to learn about pt mobility. Pt verbalizing consent for RN student in room. Pt remains min-modA+1 with HOB elevated and use of bed rails to sit EOB. Desat to 86-87% on 3 L with this level of mobility. Titrated to 4 L prior to standing. SPO2 elevated to 88%. Pt remains slow to move. ModA+1 to scoot towards EOB for feet to be flat on floor. With bed elevated, pt able to stand with supervision to RW with mod VC's for hand placement. Tolerated standing 2 min with reports of onset of dizziness. Stood additional 30 sec to see if dizziness improved but pt stating it was same. Pt returned to sitting requesting to lay down. MinA to return to supine and MaxA+2 to scoot up pt towards HOB.  BP taken prior to scooting in bed: 106/57 mm Hg. Pt returned to 3 L and after 4-5 min in supine SPO2 returned to 97% with HR at 77 BPM. Pt will benefit from STR to optimize tolerance for OOB activity due to limitations in strength and mobility.     Recommendations for follow up therapy are one component of a multi-disciplinary discharge planning process, led by the attending  physician.  Recommendations may be updated based on patient status, additional functional criteria and insurance authorization.  Follow Up Recommendations  SNF;Supervision for mobility/OOB     Equipment Recommendations       Recommendations for Other Services       Precautions / Restrictions Precautions Precautions: Fall Precaution Comments: airborne/contact precautions Restrictions Weight Bearing Restrictions: No     Mobility  Bed Mobility Overal bed mobility: Needs Assistance Bed Mobility: Supine to Sit;Sit to Supine     Supine to sit: HOB elevated;Min assist Sit to supine: Min assist;HOB elevated   General bed mobility comments: increased time, effort, use of bed rails. maxA+2 ro scoot up towards EOB Patient Response: Cooperative  Transfers Overall transfer level: Needs assistance Equipment used: Rolling walker (2 wheeled) Transfers: Sit to/from Stand Sit to Stand: Supervision         General transfer comment: No difficulty with standing. Mod VC's for hand placement.  Ambulation/Gait             General Gait Details: deferred due to dizziness.   Stairs             Wheelchair Mobility    Modified Rankin (Stroke Patients Only)       Balance Overall balance assessment: Needs assistance Sitting-balance support: Bilateral upper extremity supported;Feet supported Sitting balance-Leahy Scale: Good Sitting balance - Comments: Can maintain static sitting balance without external support.   Standing balance support: Bilateral upper extremity supported Standing balance-Leahy Scale: Fair Standing balance comment: Use of RW  Cognition Arousal/Alertness: Awake/alert Behavior During Therapy: WFL for tasks assessed/performed Overall Cognitive Status: Within Functional Limits for tasks assessed                                        Exercises      General Comments General comments (skin  integrity, edema, etc.): HR remained in 77-79 BPM. Desat to 86-87% with 2 min standing at EOB. TItrated O2 up to 4L/min. 4-5 min to return to 97%.      Pertinent Vitals/Pain Pain Assessment: No/denies pain    Home Living                      Prior Function            PT Goals (current goals can now be found in the care plan section) Acute Rehab PT Goals Patient Stated Goal: go home PT Goal Formulation: With patient Time For Goal Achievement: 06/24/21 Potential to Achieve Goals: Fair Progress towards PT goals: Not progressing toward goals - comment (limited by dizziness today)    Frequency    Min 2X/week      PT Plan Current plan remains appropriate    Co-evaluation              AM-PAC PT "6 Clicks" Mobility   Outcome Measure  Help needed turning from your back to your side while in a flat bed without using bedrails?: A Lot Help needed moving from lying on your back to sitting on the side of a flat bed without using bedrails?: A Lot Help needed moving to and from a bed to a chair (including a wheelchair)?: A Lot Help needed standing up from a chair using your arms (e.g., wheelchair or bedside chair)?: A Little Help needed to walk in hospital room?: A Lot Help needed climbing 3-5 steps with a railing? : Total 6 Click Score: 12    End of Session Equipment Utilized During Treatment: Gait belt;Oxygen Activity Tolerance: Patient limited by fatigue Patient left: in bed;with bed alarm set;with call bell/phone within reach Nurse Communication: Mobility status PT Visit Diagnosis: Muscle weakness (generalized) (M62.81);Difficulty in walking, not elsewhere classified (R26.2)     Time: 0623-7628 PT Time Calculation (min) (ACUTE ONLY): 30 min  Charges:  $Therapeutic Activity: 23-37 mins                     Mili Piltz M. Fairly IV, PT, DPT Physical Therapist- [facility]  06/13/2021, 12:31 PM

## 2021-06-13 NOTE — Progress Notes (Signed)
OT Cancellation Note  Patient Details Name: [name] MRN: [id number] DOB: 1934-07-16   Cancelled Treatment:    Reason Eval/Treat Not Completed: Patient at procedure or test/ unavailable. Per discussion with RN, pt currently receiving HD. OT to re-attempt at later time/date as able.  Fredirick Maudlin, OTR/L [place]

## 2021-06-13 NOTE — NC FL2 (Signed)
[place] LEVEL OF CARE SCREENING TOOL     IDENTIFICATION  [name] Birthdate: Sep 27, 1934 Sex: female Admission Date (Current Location): 06/09/2021  [facility] and [place] Number:  Engineering geologist and Address:  [facility], [address], [place], [place] [address]      Provider Number: [id number]  Attending Physician Name and Address:  Wyvonnia Dusky, MD  Relative Name and Phone Number:  Janete, Quilling (Spouse)   [phone] (Home Phone)    Current Level of Care: Hospital Recommended Level of Care: [place] Prior Approval Number:    Date Approved/Denied:   PASRR Number: [id number]  Discharge Plan: SNF    Current Diagnoses: Patient Active Problem List   Diagnosis Date Noted   Generalized weakness 06/10/2021   COVID-19 virus infection 06/09/2021   Hypothyroid 06/09/2021   Pulmonary edema 54/49/2010   Acute diastolic CHF (congestive heart failure) ([place]) 05/21/2021   Macular degeneration    Swelling of limb 03/29/2021   Lower limb ulcer, ankle, right, limited to breakdown of skin ([place]) 03/08/2021   Bacteremia due to Staphylococcus 08/11/2020   Pressure injury of skin 08/05/2020   Wound infection 08/04/2020   History of CVA with residual deficit 08/04/2020   Ambulatory dysfunction 08/04/2020   Anemia of chronic kidney failure, stage 5 ([place]) 08/04/2020   ESRD on hemodialysis ([place]) 07/16/2020   ESRD (end stage renal disease) on dialysis ([place]) 07/14/2020   Symptomatic anemia 07/01/2020   Hyperkalemia 06/22/2020   Prolonged QT interval 06/22/2020   Stroke ([place]) 06/01/2020   CAD (coronary artery disease) 06/01/2020   Thyroid disease 06/01/2020   CKD (chronic kidney disease) stage 5, GFR less than 15 ml/min (HCC) 06/01/2020   Carotid stenosis 06/01/2020   Pain syndrome, chronic 02/16/2020   Weakness 08/30/2016   Acute renal failure (ARF) ([place]) 05/04/2016   Type 2 diabetes with  nephropathy ([place]) 09/02/2015   HTN, goal below 140/80 08/20/2015   Diabetes mellitus type 2, uncomplicated (HCC) 04/12/1974   Cerebral embolism with cerebral infarction ([place]) 09/07/2012   Hemiplegia, unspecified, affecting dominant side 09/07/2012   S/P CABG x 4 09/05/2012   Coronary artery disease 08/22/2012   Hyperlipidemia 08/19/2012   Diabetes mellitus ([place]) 08/19/2012   Hypertension 08/19/2012   Breast cancer ([place]) 08/19/2012   GERD (gastroesophageal reflux disease) 08/19/2012    Orientation RESPIRATION BLADDER Height & Weight     Self, Time, Situation, Place (some memory impairment)  Normal, O2 (3L nasal cannula) Continent, Incontinent, External catheter Weight: 86.2 kg Height:  5\' 4"  (162.6 cm)  BEHAVIORAL SYMPTOMS/MOOD NEUROLOGICAL BOWEL NUTRITION STATUS      Continent Diet (renal, carb modified 1200 ml fluid restriction)  AMBULATORY STATUS COMMUNICATION OF NEEDS Skin   Limited Assist Verbally Bruising, Skin abrasions, Other (Comment) (Upper extremity ecchymosis, Abrasions L upper anterior arm, bilat extremety edema, non pitting)                       Personal Care Assistance Level of Assistance  Bathing, Feeding, Dressing Bathing Assistance: Limited assistance Feeding assistance: Limited assistance Dressing Assistance: Limited assistance     Functional Limitations Info  Sight, Hearing, Speech Sight Info: Impaired Hearing Info: Adequate Speech Info: Adequate    SPECIAL CARE FACTORS FREQUENCY  PT (By licensed PT), OT (By licensed OT)     PT Frequency: 5x weekly OT Frequency: 5x weekly            Contractures Contractures Info: Not present  Additional Factors Info  Code Status, Allergies Code Status Info: DNR Allergies Info: Clonidine, Darvocet (propoxyphene N, Hydralazine-Hydralazine HCL, Esomeprazole,  Guaifenesin, Phenylephrine-guaifenesin, Rabeprazole, Telithromycin,  Fluocinolone, Iodinated Diagnostic Agents Levaquin (levofloxacin)    Statins            Current Medications (06/13/2021):  This is the current hospital active medication list Current Facility-Administered Medications  Medication Dose Route Frequency Provider Last Rate Last Admin   ALPRAZolam Duanne Moron) tablet 0.5 mg  0.5 mg Oral BID PRN Cox, Amy N, DO   0.5 mg at 06/12/21 2128   aspirin chewable tablet 81 mg  81 mg Oral Q M,W,F Cox, Amy N, DO   81 mg at 06/13/21 0911   benzonatate (TESSALON) capsule 100 mg  100 mg Oral Q4H PRN Cox, Amy N, DO   100 mg at 06/12/21 9798   Chlorhexidine Gluconate Cloth 2 % PADS 6 each  6 each Topical Q0600 Colon Flattery, NP   6 each at 06/13/21 0648   chlorpheniramine-HYDROcodone (TUSSIONEX) 10-8 MG/5ML suspension 5 mL  5 mL Oral QHS PRN Cox, Amy N, DO   5 mL at 06/11/21 2310   clopidogrel (PLAVIX) tablet 75 mg  75 mg Oral Daily Cox, Amy N, DO   75 mg at 06/13/21 0912   epoetin alfa (EPOGEN) injection 10,000 Units  10,000 Units Intravenous Q M,W,F-HD Colon Flattery, NP   10,000 Units at 06/10/21 1737   famotidine (PEPCID) tablet 10 mg  10 mg Oral Daily Wyvonnia Dusky, MD   10 mg at 06/12/21 2126   gabapentin (NEURONTIN) capsule 100 mg  100 mg Oral BID Wyvonnia Dusky, MD   100 mg at 06/13/21 0913   heparin injection 5,000 Units  5,000 Units Subcutaneous Q8H Cox, Amy N, DO   5,000 Units at 06/13/21 0645   hydrALAZINE (APRESOLINE) tablet 25 mg  25 mg Oral TID Cox, Amy N, DO   25 mg at 06/12/21 2126   HYDROcodone-acetaminophen (NORCO/VICODIN) 5-325 MG per tablet 1 tablet  1 tablet Oral Q6H PRN Cox, Amy N, DO   1 tablet at 06/11/21 1733   levothyroxine (SYNTHROID) tablet 150 mcg  150 mcg Oral Q0600 Cox, Amy N, DO   150 mcg at 06/13/21 0646   metoprolol tartrate (LOPRESSOR) tablet 50 mg  50 mg Oral BID Cox, Amy N, DO   50 mg at 06/13/21 0951   ondansetron (ZOFRAN) tablet 4 mg  4 mg Oral Q6H PRN Cox, Amy N, DO   4 mg at 06/12/21 1717   Or   ondansetron (ZOFRAN) injection 4 mg  4 mg Intravenous Q6H PRN Cox, Amy N, DO       torsemide  (DEMADEX) tablet 100 mg  100 mg Oral Once per day on Tue Thu Sat Cox, Amy N, DO   100 mg at 06/11/21 1014     Discharge Medications: Please see discharge summary for a list of discharge medications.  Relevant Imaging Results:  Relevant Lab Results:   Additional Information SSN [id number]     Dialysis patient MWF  Pete Pelt, RN

## 2021-06-13 NOTE — TOC Progression Note (Signed)
Transition of Care [facility]) - Progression Note    Patient Details  Name: [name] MRN: [id number] Date of Birth: 1933-12-27  [facility]) CM/SW [place], RN Phone Number: 06/13/2021, 2:12 PM  Clinical Narrative:   Patient lives at home with husband, who is also COVID +.  Husband is over 85 years old and is having neighbors care for him at present.  PT recommends SNF, patient and spouse agreeable to bed search.  They would like [facility], TOC advised patient and spouse that we can attempt, but no guarantee, understanding was verbalized.  Bed search started.  Patient is on COVID quarantine, patient will not be able to be transferred until 10 days of quarantine are completed.  TOC contact information given, TOC to follow to discharg.e         Expected Discharge Plan and Services                                                 Social Determinants of Health (SDOH) Interventions    Readmission Risk Interventions Readmission Risk Prevention Plan 08/06/2020  Transportation Screening Complete  PCP or Specialist Appt within 3-5 Days Complete  HRI or [place] Complete  Social Work Consult for [place] Planning/Counseling Complete  Palliative Care Screening Not Applicable  Medication Review Press photographer) Complete  Some recent data might be hidden

## 2021-06-14 ENCOUNTER — Encounter (INDEPENDENT_AMBULATORY_CARE_PROVIDER_SITE_OTHER): Payer: Medicare Other

## 2021-06-14 ENCOUNTER — Ambulatory Visit (INDEPENDENT_AMBULATORY_CARE_PROVIDER_SITE_OTHER): Payer: Medicare Other | Admitting: Vascular Surgery

## 2021-06-14 LAB — CBC
HCT: 32.1 % — ABNORMAL LOW (ref 36.0–46.0)
Hemoglobin: 9.7 g/dL — ABNORMAL LOW (ref 12.0–15.0)
MCH: 29.8 pg (ref 26.0–34.0)
MCHC: 30.2 g/dL (ref 30.0–36.0)
MCV: 98.5 fL (ref 80.0–100.0)
Platelets: 213 10*3/uL (ref 150–400)
RBC: 3.26 MIL/uL — ABNORMAL LOW (ref 3.87–5.11)
RDW: 16.8 % — ABNORMAL HIGH (ref 11.5–15.5)
WBC: 7 10*3/uL (ref 4.0–10.5)
nRBC: 0 % (ref 0.0–0.2)

## 2021-06-14 LAB — COMPREHENSIVE METABOLIC PANEL
ALT: 5 U/L (ref 0–44)
AST: 7 U/L — ABNORMAL LOW (ref 15–41)
Albumin: 2.2 g/dL — ABNORMAL LOW (ref 3.5–5.0)
Alkaline Phosphatase: 90 U/L (ref 38–126)
Anion gap: 10 (ref 5–15)
BUN: 55 mg/dL — ABNORMAL HIGH (ref 8–23)
CO2: 27 mmol/L (ref 22–32)
Calcium: 7.2 mg/dL — ABNORMAL LOW (ref 8.9–10.3)
Chloride: 94 mmol/L — ABNORMAL LOW (ref 98–111)
Creatinine, Ser: 6.22 mg/dL — ABNORMAL HIGH (ref 0.44–1.00)
GFR, Estimated: 6 mL/min — ABNORMAL LOW (ref >60)
Glucose, Bld: 82 mg/dL (ref 70–99)
Potassium: 4.3 mmol/L (ref 3.5–5.1)
Sodium: 131 mmol/L — ABNORMAL LOW (ref 135–145)
Total Bilirubin: 0.7 mg/dL (ref 0.3–1.2)
Total Protein: 7 g/dL (ref 6.5–8.1)

## 2021-06-14 LAB — D-DIMER, QUANTITATIVE: D-Dimer, Quant: 1.77 ug/mL-FEU — ABNORMAL HIGH (ref 0.00–0.50)

## 2021-06-14 LAB — C-REACTIVE PROTEIN: CRP: 9.2 mg/dL — ABNORMAL HIGH (ref <1.0)

## 2021-06-14 NOTE — Progress Notes (Signed)
Physical Therapy Treatment Patient Details Name: [name] MRN: [id number] DOB: 02/13/1934 Today's Date: 06/14/2021   History of Present Illness [name] is a 87 y.o. 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    PT Comments    Pt received standing at [facility] with NT. Agreeable to PT. Pt on 3 L/min. Pt seated at EoB to don ear pleth for pulse ox due to continuous inaccurate readings due to cold fingers and nail polish. Pt performing seated therex prior to amb with multimodal cuing to optimize benefits of exercise with fair to poor carryover despite cues. HR at 88 BPM and SPO2 at 94%. Pt minguard to stand from bed. Amb 10' to recliner without difficulty with minguard assist except for reports of minor LE fatigue. HR post amb up to 90 BPM and SPO2 at 90 %. Seated rest for 2 min with SPO2 returning to 93% and HR returning to resting at 88 BPM. Pt required minA+1 to stand from recliner (lower surface) requiring 2-3 attempts before successfully standing. Amb 10' back to seated EOB. SPO2 remained > 90% with HR up to low 90's BPM. Supervision to return to supine in bed and minA to help scoot up in bed. All needs within reach with NT in room. Pt progressing with PT but still limited by fatigue with ambulation with short distances. Will benefit from STR to improve strength and endurance before returning to home environment.     Recommendations for follow up therapy  are one component of a multi-disciplinary discharge planning process, led by the attending physician.  Recommendations may be updated based on patient status, additional functional criteria and insurance authorization.  Follow Up Recommendations  SNF;Supervision for mobility/OOB     Equipment Recommendations  Other (comment) (next venue of care.)    Recommendations for Other Services       Precautions / Restrictions Precautions Precautions: Fall Precaution Comments: airborne/contact precautions Restrictions Weight Bearing Restrictions: No     Mobility  Bed Mobility Overal bed mobility: Needs Assistance Bed Mobility: Sit to Supine       Sit to supine: Supervision   General bed mobility comments: Use of bed rails and increased time to perform. Received standing at [facility] with NT. Patient Response: Cooperative  Transfers Overall transfer level: Needs assistance Equipment used: Rolling walker (2 wheeled) Transfers: Sit to/from Stand Sit to Stand: Supervision;Min assist         General transfer comment: Supervision from standing up at bed. MInA+1 from recliner.  Ambulation/Gait Ambulation/Gait assistance: Min guard Gait Distance (Feet): 20 Feet Assistive device: Rolling walker (2 wheeled) Gait Pattern/deviations: Shuffle;Trunk flexed     General Gait Details: ABle to ambulate in room withou dizziness or significant SOB.   Stairs             Wheelchair Mobility    Modified Rankin (Stroke Patients Only)       Balance Overall balance assessment: Needs assistance Sitting-balance support: Bilateral upper extremity supported;Feet supported Sitting balance-Leahy Scale: Good Sitting balance - Comments: Can maintain static sitting balance without external support.   Standing balance support: Bilateral upper extremity supported Standing balance-Leahy Scale: [place] Standing  balance comment: Use of RW                            Cognition  Arousal/Alertness: Awake/alert Behavior During Therapy: WFL for tasks assessed/performed Overall Cognitive Status: Within Functional Limits for tasks assessed                                        Exercises General Exercises - Lower Extremity Long Arc Quad: AROM;Strengthening;Both;10 reps;Seated Hip Flexion/Marching: AROM;Strengthening;Both;10 reps Other Exercises Other Exercises: Education on PLB.    General Comments General comments (skin integrity, edema, etc.): HR trending 88 BPM at baseline up to 90 BPM. SPO2 remained at 90% with ambulation on 3 L      Pertinent Vitals/Pain Pain Assessment: No/denies pain    Home Living                      Prior Function            PT Goals (current goals can now be found in the care plan section) Acute Rehab PT Goals Patient Stated Goal: go home PT Goal Formulation: With patient Time For Goal Achievement: 06/24/21 Potential to Achieve Goals: Fair Progress towards PT goals: Progressing toward goals    Frequency    Min 2X/week      PT Plan Current plan remains appropriate    Co-evaluation              AM-PAC PT "6 Clicks" Mobility   Outcome Measure  Help needed turning from your back to your side while in a flat bed without using bedrails?: A Lot Help needed moving from lying on your back to sitting on the side of a flat bed without using bedrails?: A Lot Help needed moving to and from a bed to a chair (including a wheelchair)?: A Little Help needed standing up from a chair using your arms (e.g., wheelchair or bedside chair)?: A Little Help needed to walk in hospital room?: A Little Help needed climbing 3-5 steps with a railing? : A Lot 6 Click Score: 15    End of Session Equipment Utilized During Treatment: Gait belt;Oxygen Activity Tolerance: Patient tolerated treatment well Patient left: in bed;with bed alarm set;with call bell/phone within reach;with nursing/sitter in room Nurse  Communication: Mobility status PT Visit Diagnosis: Muscle weakness (generalized) (M62.81);Difficulty in walking, not elsewhere classified (R26.2)     Time: 1517-6160 PT Time Calculation (min) (ACUTE ONLY): 21 min  Charges:  $Therapeutic Exercise: 8-22 mins                     Salem Caster. Fairly IV, PT, DPT Physical Therapist- [facility]  06/14/2021, 4:14 PM

## 2021-06-14 NOTE — Consult Note (Addendum)
[facility] Nurse Consult Note: Patient receiving care in [facility]. This consult completed after speaking with primary RN. Patient is Covid +. Reason for Consult: Skin tear LUE. Wound type: injury from tape Pressure Injury POA: Yes/No/NA Measurement: Wound bed: Drainage (amount, consistency, odor)  Periwound: Dressing procedure/placement/frequency: Use Meptiel on the LUE skin tear, secure with kerlix, no tape on skin. Mepitel can stay in place up to 5 days. Monitor the wound area(s) for worsening of condition such as: Signs/symptoms of infection,  Increase in size,  Development of or worsening of odor, Development of pain, or increased pain at the affected locations.  Notify the medical team if any of these develop.  Thank you for the consult.  Discussed plan of care with the bedside nurse.  [place] nurse will not follow at this time.  Please re-consult the [place] team if needed.  Val Riles, RN, MSN, CWOCN, CNS-BC, pager [phone]

## 2021-06-14 NOTE — Progress Notes (Signed)
[facility]  ROUNDING NOTE   Subjective:   Patient seen sitting up in chair Alert and oriented Denies nausea and vomiting Reports shortness of breath with activity  [place] O2-3L   Objective:  Vital signs in last 24 hours:  Temp:  [97.5 F (36.4 C)-98.7 F (37.1 C)] 97.9 F (36.6 C) (09/13 0727) Pulse Rate:  [64-81] 71 (09/13 0727) Resp:  [14-20] 20 (09/13 0727) BP: (117-139)/(38-50) 124/48 (09/13 0727) SpO2:  [90 %-97 %] 90 % (09/13 0727)  Weight change:  Filed Weights   06/09/21 0802  Weight: 86.2 kg    Intake/Output: I/O last 3 completed shifts: In: 100 [P.O.:100] Out: 2000 [Other:2000]   Intake/Output this shift:  No intake/output data recorded.  Physical Exam: General: NAD, sitting up in chair  Head: Normocephalic, atraumatic. Moist oral mucosal membranes  Eyes: Anicteric  Lungs:  Clear to auscultation, normal effort, [place] O2  Heart: Regular rate and rhythm  Abdomen:  Soft, nontender   Extremities:  no peripheral edema.  Neurologic: Nonfocal, moving all four extremities  Skin: No lesions  Access: Lt AVF    Basic Metabolic Panel: Recent Labs  Lab 06/10/21 0508 06/11/21 0610 06/12/21 0525 06/13/21 0515 06/14/21 0507  NA 133* 136 134* 132* 131*  K 4.1 3.8 4.2 4.6 4.3  CL 100 99 99 98 94*  CO2 26 29 24 25 27   GLUCOSE 88 95 80 93 82  BUN 48* 35* 47* 62* 55*  CREATININE 6.29* 4.76* 5.92* 6.95* 6.22*  CALCIUM 7.4* 7.9* 7.6* 7.4* 7.2*  MG 2.0  --   --   --   --      Liver Function Tests: Recent Labs  Lab 06/10/21 0508 06/11/21 0610 06/12/21 0525 06/13/21 0515 06/14/21 0507  AST 7* 8* <5* <5* 7*  ALT 5 6 5 6 5   ALKPHOS 80 89 77 93 90  BILITOT 0.5 0.7 0.7 0.7 0.7  PROT 6.7 7.5 6.2* 7.1 7.0  ALBUMIN 2.2* 2.6* 2.0* 2.3* 2.2*    Recent Labs  Lab 06/08/21 0828  LIPASE 30    No results for input(s): AMMONIA in the last 168 hours.  CBC: Recent Labs  Lab 06/08/21 0828 06/09/21 0851 06/10/21 0508 06/11/21 0610  06/12/21 0525 06/13/21 0515 06/14/21 0507  WBC 5.4   < > 4.7 4.6 5.5 5.9 7.0  NEUTROABS 3.9  --  2.9  --   --   --   --   HGB 9.1*   < > 8.2* 9.8* 9.1* 9.9* 9.7*  HCT 30.6*   < > 26.9* 32.2* 30.7* 32.0* 32.1*  MCV 101.0*   < > 101.5* 99.4 100.7* 99.1 98.5  PLT 149*   < > 151 175 172 220 213   < > = values in this interval not displayed.     Cardiac Enzymes: No results for input(s): CKTOTAL, CKMB, CKMBINDEX, TROPONINI in the last 168 hours.  BNP: Invalid input(s): POCBNP  CBG: Recent Labs  Lab 06/10/21 0755 06/10/21 1306 06/10/21 2241 06/11/21 0911 06/11/21 1215  GLUCAP 83 91 109* 74 98     Microbiology: Results for orders placed or performed during the hospital encounter of 06/09/21  SARS CORONAVIRUS 2 (TAT 6-24 HRS) Nasopharyngeal Nasopharyngeal Swab     Status: Abnormal   Collection Time: 06/09/21 12:24 PM   Specimen: Nasopharyngeal Swab  Result Value Ref Range Status   SARS Coronavirus 2 POSITIVE (A) NEGATIVE Final    Comment: (NOTE) SARS-CoV-2 target nucleic acids are DETECTED.  The SARS-CoV-2 RNA  is generally detectable in upper and lower respiratory specimens during the acute phase of infection. Positive results are indicative of the presence of SARS-CoV-2 RNA. Clinical correlation with patient history and other diagnostic information is  necessary to determine patient infection status. Positive results do not rule out bacterial infection or co-infection with other viruses.  The expected result is Negative.  Fact Sheet for Patients: SugarRoll.be  Fact Sheet for Healthcare Providers: https://www.woods-mathews.com/  This test is not yet approved or cleared by the [place] FDA and  has been authorized for detection and/or diagnosis of SARS-CoV-2 by FDA under an Emergency Use Authorization (EUA). This EUA will remain  in effect (meaning this test can be used) for the duration of the COVID-19 declaration under  Section 564(b)(1) of the Act, 21 U. S.C. section 360bbb-3(b)(1), unless the authorization is terminated or revoked sooner.   Performed at [facility], [place] [address] [place],  [address]     Coagulation Studies: No results for input(s): LABPROT, INR in the last 72 hours.  Urinalysis: No results for input(s): COLORURINE, LABSPEC, PHURINE, GLUCOSEU, HGBUR, BILIRUBINUR, KETONESUR, PROTEINUR, UROBILINOGEN, NITRITE, LEUKOCYTESUR in the last 72 hours.  Invalid input(s): APPERANCEUR    Imaging: No results found.   Medications:      aspirin  81 mg Oral Q M,W,F   Chlorhexidine Gluconate Cloth  6 each Topical Q0600   clopidogrel  75 mg Oral Daily   epoetin (EPOGEN/PROCRIT) injection  10,000 Units Intravenous Q M,W,F-HD   famotidine  10 mg Oral Daily   gabapentin  100 mg Oral BID   heparin  5,000 Units Subcutaneous Q8H   hydrALAZINE  25 mg Oral TID   levothyroxine  150 mcg Oral Q0600   metoprolol tartrate  50 mg Oral BID   torsemide  100 mg Oral Once per day on Tue Thu Sat   ALPRAZolam, benzonatate, HYDROcodone-acetaminophen, ondansetron **OR** ondansetron (ZOFRAN) IV  Assessment/ Plan:  [name] is a 87 y.o. white female with end stage renal disease on hemodialysis secondary to pauci-immune glomerulonephritis,hypertension, hypothyroidism, breast cancer, coronary artery disease status post CABG, congestive heart failure, GERD, hyperlipidemia, neuropathy, CVA who is admitted to [facility] on 06/09/2021 for Generalized weakness [R53.1] ESRD on hemodialysis ([place]) [N18.6, Z99.2] COVID-19 virus infection [U07.1]  [facility] MWF [place] Left AVF 82kg  End Stage Renal Disease: Received dialysis Received dialysis yesterday, tolerated well. Next treatment scheduled for Wednesday  Hypertension: current regimen of metoprolol, hydralazine and torsemide. BP 124/48.   Anemia of chronic kidney disease: hemoglobin 9.7. No need for ESA at this time.   Secondary  Hyperparathyroidism: not currently on binders.     LOS: [place] 9/13/202210:56 AM

## 2021-06-14 NOTE — TOC Progression Note (Signed)
Transition of Care [facility]) - Progression Note    Patient Details  Name: [name] MRN: [id number] Date of Birth: 1934-04-24  Transition [facility]) CM/SW [place], RN Phone Number: 06/14/2021, 9:37 AM  Clinical Narrative:   [name] accepted patient, patient and family accepted [facility], [place] (admission coordinator) aware.  Patient will be eligible to transfer on Day 11 after COVID + status.  TOC to follow to discharge.         Expected Discharge Plan and Services                                                 Social Determinants of Health (SDOH) Interventions    Readmission Risk Interventions Readmission Risk Prevention Plan 08/06/2020  Transportation Screening Complete  PCP or Specialist Appt within 3-5 Days Complete  HRI or [place] Complete  Social Work Consult for [place] Planning/Counseling Complete  Palliative Care Screening Not Applicable  Medication Review Press photographer) Complete  Some recent data might be hidden

## 2021-06-14 NOTE — Progress Notes (Signed)
PROGRESS NOTE   HPI was taken from Dr. Tobie Poet: [name] is a 87 y.o. 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   At bedside patient is able to tell me her name, her age, identify her husband at bedside, the current calendar year.   She endorsed weakness that started about two days. She denies fever. She endorses cough that is productive that started Saturday, 06/04/21. She states the cough is not productive and she feels like she needs to cough something up but is unable to. She reports she was feeling her baseline self last Thursday and she got her nails done.    She denies any focal deficits.  Her main complaint is generalized weakness.  Hospital course from Dr. Jimmye Norman 9/9-9/13/22: Pt presented w/ generalized weakness. PT/OT recs SNF but pt initially refused and now agreeable to SNF. Of note, pt was found to have COVID19 pneumonia and has since completed the course of remdesivir. Pt is currently waiting on SNF placement but pt has to be quarantined for 10 days prior to d/c to SNF     [name]  DXA:[id number] DOB: [birth year] DOA: 06/09/2021 PCP: Idelle Crouch, MD    Assessment & Plan:   Active Problems:   Hyperlipidemia   Hypertension   Breast cancer (HCC)   GERD (gastroesophageal reflux disease)   Coronary artery disease   S/P CABG x 4   Weakness   Pain syndrome, chronic   CAD (coronary artery disease)   Type 2 diabetes with  nephropathy (HCC)   HTN, goal below 140/80   CKD (chronic kidney disease) stage 5, GFR less than 15 ml/min (HCC)   Ambulatory dysfunction   Anemia of chronic kidney failure, stage 5 ([place])   Bacteremia due to Staphylococcus   ESRD (end stage renal disease) on dialysis ([place])   COVID-19 virus infection   Hypothyroid   Generalized weakness   Weakness: PT/OT recs SNF & initially pt refused SNF but pt is now agreeable.    COVID-19 pneumonia: completed remdesivir course. Continue on bronchodilators and encourage incentive spirometry. Continue on airborne and contact precautions  Acute on chronic hypoxic respiratory failure: likely secondary to [place]. Continue wean back home dose of oxygen to baseline which 2 L [place] and pt is currently on 3L [place]  ESRD: on HD. 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 Completed abx course on 06/08/21   DM2: well controlled, HbA1c 4.4. Diet controlled  Neuropathy: continue on home dose of gabapentin   HTN: continue on home dose of hydralazine   Anxiety: severity unknown. Continue on home dose of xanax   CAD: s/p CABG in December 2013. Continue on home dose of plavix, aspirin   Chronic pain syndrome: continue on home dose of norco    Hypothyroidism: continue on home dose of levothyroxine   Deep tissue injury and unstageable pressure injury left anterior foot: present on admission. Continue w/ wound care  Chronic combined CHF: fluid management as per HD. Monitor I/Os  DVT prophylaxis: heparin  Code Status: DNR  Family Communication:  Disposition Plan: d/c to SNF   Level of care:  Med-Surg  Status is: Inpatient  Remains inpatient appropriate because:Unsafe d/c plan, IV treatments appropriate due to intensity of illness or inability to take PO, and Inpatient level of care appropriate due to severity of illness agreed to go to SNF today, CM is aware  Dispo: The patient is from: Home               Anticipated d/c is to: SNF              Patient currently is not medically stable to d/c.   Difficult to place patient : unclear         Consultants:    Procedures:   Antimicrobials:   Subjective: Pt c/o fatigue   Objective: Vitals:   06/13/21 2005 06/13/21 2342 06/14/21 0404 06/14/21 0727  BP: (!) 122/50 (!) 118/48 (!) 120/43 (!) 124/48  Pulse: 81 64 66 71  Resp: 20 16 17 20   Temp: 98 F (36.7 C) (!) 97.5 F (36.4 C) 98.7 F (37.1 C) 97.9 F (36.6 C)  TempSrc: Oral Oral Oral   SpO2: 91% 97% 94% 90%  Weight:      Height:        Intake/Output Summary (Last 24 hours) at 06/14/2021 0750 Last data filed at 06/13/2021 2005 Gross per 24 hour  Intake 100 ml  Output 2000 ml  Net -1900 ml   Filed Weights   06/09/21 0802  Weight: 86.2 kg    Examination:  General exam: Appears comfortable  Respiratory system: decreased breath sounds b/l Cardiovascular system: S1 & S2+. No gallop or rubs  Gastrointestinal system: Abd is soft, NT, obese & normal bowel sounds  Central nervous system: Alert and awake. Moves all extremities  Psychiatry: Judgement and insight appear poor. Flat mood and affect     Data Reviewed: I have personally reviewed following labs and imaging studies  CBC: Recent Labs  Lab 06/08/21 0828 06/09/21 0851 06/10/21 0508 06/11/21 0610 06/12/21 0525 06/13/21 0515 06/14/21 0507  WBC 5.4   < > 4.7 4.6 5.5 5.9 7.0  NEUTROABS 3.9  --  2.9  --   --   --   --   HGB 9.1*   < > 8.2* 9.8* 9.1* 9.9* 9.7*  HCT 30.6*   < > 26.9* 32.2* 30.7* 32.0* 32.1*  MCV 101.0*   < > 101.5* 99.4 100.7* 99.1 98.5  PLT 149*   < > 151 175 172 220 213   < > = values in this interval not displayed.   Basic Metabolic Panel: Recent Labs  Lab 06/10/21 0508 06/11/21 0610 06/12/21 0525 06/13/21 0515 06/14/21 0507  NA 133* 136 134* 132* 131*  K 4.1 3.8 4.2 4.6 4.3  CL 100 99 99 98 94*  CO2 26 29 24 25 27   GLUCOSE 88 95 80 93 82  BUN 48* 35* 47* 62* 55*  CREATININE  6.29* 4.76* 5.92* 6.95* 6.22*  CALCIUM 7.4* 7.9* 7.6* 7.4* 7.2*  MG 2.0  --   --   --   --    GFR: Estimated Creatinine Clearance: 7.2 mL/min (A) (by C-G formula based on SCr of 6.22 mg/dL (H)). Liver Function Tests: Recent Labs  Lab 06/10/21 0508 06/11/21 0610 06/12/21 0525 06/13/21 0515 06/14/21 0507  AST 7* 8* <5* <5* 7*  ALT 5 6 5 6 5   ALKPHOS 80 89 77 93 90  BILITOT 0.5 0.7 0.7 0.7 0.7  PROT 6.7 7.5 6.2* 7.1 7.0  ALBUMIN 2.2* 2.6* 2.0* 2.3* 2.2*  Recent Labs  Lab 06/08/21 0828  LIPASE 30   No results for input(s): AMMONIA in the last 168 hours. Coagulation Profile: No results for input(s): INR, PROTIME in the last 168 hours. Cardiac Enzymes: No results for input(s): CKTOTAL, CKMB, CKMBINDEX, TROPONINI in the last 168 hours. BNP (last 3 results) No results for input(s): PROBNP in the last 8760 hours. HbA1C: No results for input(s): HGBA1C in the last 72 hours. CBG: Recent Labs  Lab 06/10/21 0755 06/10/21 1306 06/10/21 2241 06/11/21 0911 06/11/21 1215  GLUCAP 83 91 109* 74 98   Lipid Profile: No results for input(s): CHOL, HDL, LDLCALC, TRIG, CHOLHDL, LDLDIRECT in the last 72 hours. Thyroid Function Tests: No results for input(s): TSH, T4TOTAL, FREET4, T3FREE, THYROIDAB in the last 72 hours. Anemia Panel: No results for input(s): VITAMINB12, FOLATE, FERRITIN, TIBC, IRON, RETICCTPCT in the last 72 hours.  Sepsis Labs: No results for input(s): PROCALCITON, LATICACIDVEN in the last 168 hours.  Recent Results (from the past 240 hour(s))  SARS CORONAVIRUS 2 (TAT 6-24 HRS) Nasopharyngeal Nasopharyngeal Swab     Status: Abnormal   Collection Time: 06/09/21 12:24 PM   Specimen: Nasopharyngeal Swab  Result Value Ref Range Status   SARS Coronavirus 2 POSITIVE (A) NEGATIVE Final    Comment: (NOTE) SARS-CoV-2 target nucleic acids are DETECTED.  The SARS-CoV-2 RNA is generally detectable in upper and lower respiratory specimens during the acute phase of  infection. Positive results are indicative of the presence of SARS-CoV-2 RNA. Clinical correlation with patient history and other diagnostic information is  necessary to determine patient infection status. Positive results do not rule out bacterial infection or co-infection with other viruses.  The expected result is Negative.  Fact Sheet for Patients: SugarRoll.be  Fact Sheet for Healthcare Providers: https://www.woods-mathews.com/  This test is not yet approved or cleared by the [place] FDA and  has been authorized for detection and/or diagnosis of SARS-CoV-2 by FDA under an Emergency Use Authorization (EUA). This EUA will remain  in effect (meaning this test can be used) for the duration of the COVID-19 declaration under Section 564(b)(1) of the Act, 21 U. S.C. section 360bbb-3(b)(1), unless the authorization is terminated or revoked sooner.   Performed at [facility], [place] [address] [place], [place] [address]          Radiology Studies: No results found.      Scheduled Meds:  aspirin  81 mg Oral Q M,W,F   Chlorhexidine Gluconate Cloth  6 each Topical Q0600   clopidogrel  75 mg Oral Daily   epoetin (EPOGEN/PROCRIT) injection  10,000 Units Intravenous Q M,W,F-HD   famotidine  10 mg Oral Daily   gabapentin  100 mg Oral BID   heparin  5,000 Units Subcutaneous Q8H   hydrALAZINE  25 mg Oral TID   levothyroxine  150 mcg Oral Q0600   metoprolol tartrate  50 mg Oral BID   torsemide  100 mg Oral Once per day on Tue Thu Sat   Continuous Infusions:     LOS: 4 days    Time spent: 20 mins     Wyvonnia Dusky, MD [facility] Pager [phone]  If 7PM-7AM, please contact night-coverage 06/14/2021, 7:50 AM

## 2021-06-15 DIAGNOSIS — J1282 Pneumonia due to coronavirus disease 2019: Secondary | ICD-10-CM

## 2021-06-15 DIAGNOSIS — J9611 Chronic respiratory failure with hypoxia: Secondary | ICD-10-CM

## 2021-06-15 DIAGNOSIS — L89620 Pressure ulcer of left heel, unstageable: Secondary | ICD-10-CM

## 2021-06-15 DIAGNOSIS — J9621 Acute and chronic respiratory failure with hypoxia: Secondary | ICD-10-CM

## 2021-06-15 DIAGNOSIS — G8929 Other chronic pain: Secondary | ICD-10-CM

## 2021-06-15 DIAGNOSIS — G629 Polyneuropathy, unspecified: Secondary | ICD-10-CM

## 2021-06-15 LAB — CBC
HCT: 32.2 % — ABNORMAL LOW (ref 36.0–46.0)
Hemoglobin: 9.9 g/dL — ABNORMAL LOW (ref 12.0–15.0)
MCH: 29.8 pg (ref 26.0–34.0)
MCHC: 30.7 g/dL (ref 30.0–36.0)
MCV: 97 fL (ref 80.0–100.0)
Platelets: 240 10*3/uL (ref 150–400)
RBC: 3.32 MIL/uL — ABNORMAL LOW (ref 3.87–5.11)
RDW: 16.4 % — ABNORMAL HIGH (ref 11.5–15.5)
WBC: 7.7 10*3/uL (ref 4.0–10.5)
nRBC: 0 % (ref 0.0–0.2)

## 2021-06-15 LAB — BASIC METABOLIC PANEL
Anion gap: 11 (ref 5–15)
BUN: 67 mg/dL — ABNORMAL HIGH (ref 8–23)
CO2: 25 mmol/L (ref 22–32)
Calcium: 7.1 mg/dL — ABNORMAL LOW (ref 8.9–10.3)
Chloride: 94 mmol/L — ABNORMAL LOW (ref 98–111)
Creatinine, Ser: 6.91 mg/dL — ABNORMAL HIGH (ref 0.44–1.00)
GFR, Estimated: 5 mL/min — ABNORMAL LOW (ref >60)
Glucose, Bld: 88 mg/dL (ref 70–99)
Potassium: 4.4 mmol/L (ref 3.5–5.1)
Sodium: 130 mmol/L — ABNORMAL LOW (ref 135–145)

## 2021-06-15 NOTE — Progress Notes (Signed)
[facility]  ROUNDING NOTE   Subjective:   Patient resting in bed Alert and oriented Tolerating meals Denies shortness of breath, weaned to 2L [place] Patient seen later in bathroom working with PT/OT  Dialysis scheduled today  Objective:  Vital signs in last 24 hours:  Temp:  [97.6 F (36.4 C)-98.3 F (36.8 C)] 97.6 F (36.4 C) (09/14 0933) Pulse Rate:  [72-79] 78 (09/14 0933) Resp:  [17-20] 19 (09/14 0933) BP: (111-118)/(45-52) 112/45 (09/14 0933) SpO2:  [91 %-100 %] 91 % (09/14 0933)  Weight change:  Filed Weights   06/09/21 0802  Weight: 86.2 kg    Intake/Output: I/O last 3 completed shifts: In: 200 [P.O.:200] Out: -    Intake/Output this shift:  Total I/O In: -  Out: 250 [Urine:250]  Physical Exam: General: NAD, resting in bed  Head: Normocephalic, atraumatic. Moist oral mucosal membranes  Eyes: Anicteric  Lungs:  Clear to auscultation, normal effort, [place] O2  Heart: Regular rate and rhythm  Abdomen:  Soft, nontender   Extremities:  no peripheral edema.  Neurologic: Nonfocal, moving all four extremities  Skin: No lesions  Access: Lt AVF    Basic Metabolic Panel: Recent Labs  Lab 06/10/21 0508 06/11/21 0610 06/12/21 0525 06/13/21 0515 06/14/21 0507 06/15/21 0349  NA 133* 136 134* 132* 131* 130*  K 4.1 3.8 4.2 4.6 4.3 4.4  CL 100 99 99 98 94* 94*  CO2 26 29 24 25 27 25   GLUCOSE 88 95 80 93 82 88  BUN 48* 35* 47* 62* 55* 67*  CREATININE 6.29* 4.76* 5.92* 6.95* 6.22* 6.91*  CALCIUM 7.4* 7.9* 7.6* 7.4* 7.2* 7.1*  MG 2.0  --   --   --   --   --      Liver Function Tests: Recent Labs  Lab 06/10/21 0508 06/11/21 0610 06/12/21 0525 06/13/21 0515 06/14/21 0507  AST 7* 8* <5* <5* 7*  ALT 5 6 5 6 5   ALKPHOS 80 89 77 93 90  BILITOT 0.5 0.7 0.7 0.7 0.7  PROT 6.7 7.5 6.2* 7.1 7.0  ALBUMIN 2.2* 2.6* 2.0* 2.3* 2.2*    No results for input(s): LIPASE, AMYLASE in the last 168 hours.  No results for input(s): AMMONIA in the last 168  hours.  CBC: Recent Labs  Lab 06/10/21 0508 06/11/21 0610 06/12/21 0525 06/13/21 0515 06/14/21 0507 06/15/21 0349  WBC 4.7 4.6 5.5 5.9 7.0 7.7  NEUTROABS 2.9  --   --   --   --   --   HGB 8.2* 9.8* 9.1* 9.9* 9.7* 9.9*  HCT 26.9* 32.2* 30.7* 32.0* 32.1* 32.2*  MCV 101.5* 99.4 100.7* 99.1 98.5 97.0  PLT 151 175 172 220 213 240     Cardiac Enzymes: No results for input(s): CKTOTAL, CKMB, CKMBINDEX, TROPONINI in the last 168 hours.  BNP: Invalid input(s): POCBNP  CBG: Recent Labs  Lab 06/10/21 0755 06/10/21 1306 06/10/21 2241 06/11/21 0911 06/11/21 1215  GLUCAP 83 91 109* 74 98     Microbiology: Results for orders placed or performed during the hospital encounter of 06/09/21  SARS CORONAVIRUS 2 (TAT 6-24 HRS) Nasopharyngeal Nasopharyngeal Swab     Status: Abnormal   Collection Time: 06/09/21 12:24 PM   Specimen: Nasopharyngeal Swab  Result Value Ref Range Status   SARS Coronavirus 2 POSITIVE (A) NEGATIVE Final    Comment: (NOTE) SARS-CoV-2 target nucleic acids are DETECTED.  The SARS-CoV-2 RNA is generally detectable in upper and lower respiratory specimens during the acute  phase of infection. Positive results are indicative of the presence of SARS-CoV-2 RNA. Clinical correlation with patient history and other diagnostic information is  necessary to determine patient infection status. Positive results do not rule out bacterial infection or co-infection with other viruses.  The expected result is Negative.  Fact Sheet for Patients: SugarRoll.be  Fact Sheet for Healthcare Providers: https://www.woods-mathews.com/  This test is not yet approved or cleared by the [place] FDA and  has been authorized for detection and/or diagnosis of SARS-CoV-2 by FDA under an Emergency Use Authorization (EUA). This EUA will remain  in effect (meaning this test can be used) for the duration of the COVID-19 declaration under Section  564(b)(1) of the Act, 21 U. S.C. section 360bbb-3(b)(1), unless the authorization is terminated or revoked sooner.   Performed at [facility], [place] [address], [place], [place] [address]     Coagulation Studies: No results for input(s): LABPROT, INR in the last 72 hours.  Urinalysis: No results for input(s): COLORURINE, LABSPEC, PHURINE, GLUCOSEU, HGBUR, BILIRUBINUR, KETONESUR, PROTEINUR, UROBILINOGEN, NITRITE, LEUKOCYTESUR in the last 72 hours.  Invalid input(s): APPERANCEUR    Imaging: No results found.   Medications:      aspirin  81 mg Oral Q M,W,F   Chlorhexidine Gluconate Cloth  6 each Topical Q0600   clopidogrel  75 mg Oral Daily   epoetin (EPOGEN/PROCRIT) injection  10,000 Units Intravenous Q M,W,F-HD   famotidine  10 mg Oral Daily   gabapentin  100 mg Oral BID   heparin  5,000 Units Subcutaneous Q8H   hydrALAZINE  25 mg Oral TID   levothyroxine  150 mcg Oral Q0600   metoprolol tartrate  50 mg Oral BID   torsemide  100 mg Oral Once per day on Tue Thu Sat   ALPRAZolam, benzonatate, HYDROcodone-acetaminophen, ondansetron **OR** ondansetron (ZOFRAN) IV  Assessment/ Plan:  [name] is a 87 y.o. white female with end stage renal disease on hemodialysis secondary to pauci-immune glomerulonephritis,hypertension, hypothyroidism, breast cancer, coronary artery disease status post CABG, congestive heart failure, GERD, hyperlipidemia, neuropathy, CVA who is admitted to [facility] on 06/09/2021 for Generalized weakness [R53.1] ESRD on hemodialysis ([place] [place]) [N18.6, Z99.2] COVID-19 virus infection [U07.1]  [facility] M[facility] [place] Left AVF 82kg  End Stage Renal Disease: Scheduled to receive dialysis this afternoon. Next treatment scheduled for Friday  Hypertension: current regimen of metoprolol, hydralazine and torsemide. BP 112/45.   Anemia of chronic kidney disease: hemoglobin 9.9. ESA held at this time  Secondary Hyperparathyroidism: not currently on  binders.     LOS: 5 [name] 9/14/20229:48 AM

## 2021-06-15 NOTE — Progress Notes (Signed)
Patient ID: [name], female   DOB: 03-01-1934, 87 y.o.   MRN: [id number] [facility] PROGRESS NOTE  MAYLYN NARVAIZ WUX:[id number] DOB: [birth year] DOA: 06/09/2021 PCP: Idelle Crouch, MD  HPI/Subjective: Patient feeling okay.  She is interested now in going home.  The patient needed quite a bit of help just to get to the commode.  Admitted with weakness and inability to get up and also found to have COVID-19 infection.  Objective: Vitals:   06/15/21 1545 06/15/21 1559  BP: (!) 104/30 (!) 105/40  Pulse: 70 70  Resp: 16 16  Temp:    SpO2: 96% 95%    Intake/Output Summary (Last 24 hours) at 06/15/2021 1622 Last data filed at 06/15/2021 1559 Gross per 24 hour  Intake 100 ml  Output 2250 ml  Net -2150 ml   Filed Weights   06/09/21 0802  Weight: 86.2 kg    ROS: Review of Systems  Respiratory:  Negative for shortness of breath.   Cardiovascular:  Negative for chest pain.  Gastrointestinal:  Negative for abdominal pain, nausea and vomiting.  Exam: Physical Exam HENT:     Head: Normocephalic.     Mouth/Throat:     Pharynx: No oropharyngeal exudate.  Eyes:     General: Lids are normal.     Conjunctiva/sclera: Conjunctivae normal.     Pupils: Pupils are equal, round, and reactive to light.  Cardiovascular:     Rate and Rhythm: Normal rate and regular rhythm.     Heart sounds: Normal heart sounds, S1 normal and S2 normal.  Pulmonary:     Breath sounds: Normal breath sounds. No decreased breath sounds, wheezing, rhonchi or rales.  Abdominal:     Palpations: Abdomen is soft.     Tenderness: There is no abdominal tenderness.  Musculoskeletal:     Right lower leg: Swelling present.     Left lower leg: Swelling present.  Skin:    General: Skin is warm.     Comments: Bilateral lower legs wrapped  Neurological:     Mental Status: She is alert and oriented to person, place, and time.      Scheduled Meds:  aspirin  81 mg Oral Q M,W,F   Chlorhexidine  Gluconate Cloth  6 each Topical Q0600   clopidogrel  75 mg Oral Daily   epoetin (EPOGEN/PROCRIT) injection  10,000 Units Intravenous Q M,W,F-HD   famotidine  10 mg Oral Daily   gabapentin  100 mg Oral BID   heparin  5,000 Units Subcutaneous Q8H   hydrALAZINE  25 mg Oral TID   levothyroxine  150 mcg Oral Q0600   metoprolol tartrate  50 mg Oral BID   torsemide  100 mg Oral Once per day on Tue Thu Sat     Assessment/Plan:  COVID-19 pneumonia.  Completed remdesivir course. Weakness.  Physical therapy and OT still recommending rehab.  Initially patient refused but then patient was agreeable.  Now she is interested in going home. Acute hypoxic respiratory failure.  Patient was on 3 L nasal cannula this morning and I dialed her down to 2.5.  Normally wears 2. End-stage renal disease on hemodialysis.  Last session today Deep tissue injury and unstageable pressure ulcer, present on admission.  See full description below.  Likely the recent source of bacteremia with sphingobacterium multivorum Neuropathy on gabapentin Essential hypertension on hydralazine Anxiety on Xanax History of CAD on Plavix and aspirin Chronic pain on Norco Hypothyroidism unspecified on levothyroxine Chronic combined CHF managed with hemodialysis.  Last EF 35 to 40%.  Pressure Injury 08/04/20 Sacrum Medial Stage 2 -  Partial thickness loss of dermis presenting as a shallow open injury with a red, pink wound bed without slough. (Active)  08/04/20 2300  Location: Sacrum  Location Orientation: Medial  Staging: Stage 2 -  Partial thickness loss of dermis presenting as a shallow open injury with a red, pink wound bed without slough.  Wound Description (Comments):   Present on Admission: Yes     Pressure Injury 05/24/21 Foot Anterior;Left Unstageable - Full thickness tissue loss in which the base of the injury is covered by slough (yellow, tan, gray, green or brown) and/or eschar (tan, brown or black) in the wound bed.  (Active)  05/24/21 1800  Location: Foot  Location Orientation: Anterior;Left  Staging: Unstageable - Full thickness tissue loss in which the base of the injury is covered by slough (yellow, tan, gray, green or brown) and/or eschar (tan, brown or black) in the wound bed.  Wound Description (Comments):   Present on Admission: Yes     Pressure Injury 06/09/21 Heel Left Deep Tissue Pressure Injury - Purple or maroon localized area of discolored intact skin or blood-filled blister due to damage of underlying soft tissue from pressure and/or shear. (Active)  06/09/21 2100  Location: Heel  Location Orientation: Left  Staging: Deep Tissue Pressure Injury - Purple or maroon localized area of discolored intact skin or blood-filled blister due to damage of underlying soft tissue from pressure and/or shear.  Wound Description (Comments):   Present on Admission: Yes       Code Status:     Code Status Orders  (From admission, onward)           Start     Ordered   06/09/21 1415  Do not attempt resuscitation (DNR)  Continuous       Question Answer Comment  In the event of cardiac or respiratory ARREST Do not call a "code blue"   In the event of cardiac or respiratory ARREST Do not perform Intubation, CPR, defibrillation or ACLS   In the event of cardiac or respiratory ARREST Use medication by any route, position, wound care, and other measures to relive pain and suffering. May use oxygen, suction and manual treatment of airway obstruction as needed for comfort.      06/09/21 1414           Code Status History     Date Active Date Inactive Code Status Order ID Comments User Context   06/09/2021 1229 06/09/2021 1414 Full Code [id number]  CoxBriant Cedar, DO ED   05/21/2021 0944 05/26/2021 0224 DNR [id number]  Collier Bullock, MD ED   05/21/2021 [phone] 05/21/2021 0944 DNR [id number]  Collier Bullock, MD ED   05/21/2021 0542 05/21/2021 0938 Full Code [id number]  Athena Masse, MD ED   08/04/2020 0535  08/11/2020 1917 Full Code [id number]  Athena Masse, MD ED   06/22/2020 1908 07/02/2020 1918 Full Code [id number]  Gwynne Edinger, MD ED   05/04/2016 1336 05/09/2016 1740 Full Code [id number]  Lytle Butte, MD Inpatient   04/28/2016 0157 05/02/2016 1656 Full Code [id number]  Harrie Foreman, MD ED   07/17/2015 1111 07/20/2015 1432 Full Code [id number]  Bettey Costa, MD Inpatient   09/10/2012 1956 09/23/2012 1532 Full Code [id number]  Lorraine Lax, RN Inpatient   09/09/2012 0817 09/10/2012 1956 Full Code [id number]  Rexene Alberts, MD Inpatient   09/05/2012 1324 09/09/2012 0817 Full Code  [id number]  Edwyna Perfect, RN Inpatient      Advance Directive Documentation    Flowsheet Row Most Recent Value  Type of Advance Directive Living will  Pre-existing out of facility DNR order (yellow form or pink MOST form) --  "MOST" Form in Place? --      Family Communication: Tried to reach husband on the phone.  His cell phone the mailbox was full.  On his home phone I got disconnected after someone answered. Disposition Plan: Status is: Inpatient  Dispo: The patient is from: Home              Anticipated d/c is to: Rehab on 06/20/2021              Patient currently still in [place] isolation   Difficult to place patient.  Yes  Time spent: 28 minutes  [place]

## 2021-06-15 NOTE — Progress Notes (Signed)
Tolerated 3 hrs HD treatment without difficulty.  Cannulated without difficulty using 15g needle.   Uf goal of 2L met, report to primary nurse

## 2021-06-15 NOTE — Progress Notes (Signed)
Occupational Therapy Treatment Patient Details Name: [name] MRN: [id number] DOB: Nov 26, 1933 Today's Date: 06/15/2021   History of present illness [name] is a 87 y.o. 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   OT comments  [name] was seen for OT treatment on this date. Upon arrival to room pt reclined in bed agreeable to session, husband at bedside. Pt trialed exiting flat bed to simulate home setup - requires MIN A (husband at bedside reports very limited in physical assist he can provide). Pt requires MOD A sit<>stand from toilet height and MAX A perihygiene in standing. Pt tolerated ~10 ft x2 functional mobility requiring CGA + RW, requires +1 to manage O2 tank/lines (pt unable to prevent O2 lines tangling around feet and stepping on O2 line without cues). Pt requires MIN A + RW to navigate around objects in bathroom (similar to home environment).  SpO2 88% on 2L [place] during mobility, placed on 3L with no improvement (pt mouth breathers and difficulty maintianing PLB except with constant cues). SpO2 mid 90s on 2L [place] at rest. Pt making progress toward goals. Pt continues to benefit from skilled OT services to maximize return to PLOF and minimize risk of future falls, injury, caregiver burden, and readmission. Will continue to follow POC. Discharge recommendation remains appropriate.     Recommendations for follow up therapy are one component  of a multi-disciplinary discharge planning process, led by the attending physician.  Recommendations may be updated based on patient status, additional functional criteria and insurance authorization.    Follow Up Recommendations  SNF    Equipment Recommendations  None recommended by OT    Recommendations for Other Services      Precautions / Restrictions Precautions Precautions: Fall Restrictions Weight Bearing Restrictions: No       Mobility Bed Mobility Overal bed mobility: Needs Assistance Bed Mobility: Supine to Sit;Sit to Supine     Supine to sit: Min assist Sit to supine: Supervision   General bed mobility comments: from flat bed c no rails unable to exit bed w/o MIN A (husband at bedside reports very limited in physical assist he can provide)    Transfers Overall transfer level: Needs assistance Equipment used: Rolling walker (2 wheeled) Transfers: Sit to/from Stand Sit to Stand: Mod assist         General transfer comment: MOD A standing from low toilet height    Balance Overall balance assessment: Needs assistance Sitting-balance support: Bilateral upper extremity supported;Feet supported Sitting balance-Leahy Scale: Good     Standing balance support: Single extremity supported;During functional activity Standing balance-Leahy Scale: Fair                             ADL either performed or assessed with clinical judgement   ADL Overall ADL's : Needs assistance/impaired                                       General ADL Comments: MOD A sit<>stand from toilet height and MAX  A perihygiene in standing. Pt tolerated ~10 ft x2 functional mobility requiring CGA + RW and +1 to manage O2 tank/lines (pt unable to prevent O2 lines tangling around feet and stepping on O2 line without cues). Pt requires MIN A + RW to navigate around objects in bathroom (similar to home environment)      Cognition Arousal/Alertness:  Awake/alert Behavior During Therapy: WFL for tasks assessed/performed Overall Cognitive Status: Within Functional Limits for tasks assessed                                 General Comments: poor carryover of PLB, requires cues t/o session for safety        Exercises Exercises: Other exercises Other Exercises Other Exercises: Pt edcuated re: OT role, DME recs, d/c recs, falls prevention, ECS, home routines/modifications Other Exercises: LBD< toieting, sup<>sit, sit<>stand, sitting/standing balance/tolerance   Shoulder Instructions       General Comments SpO2 88% on 2L [place] during mobility, placed on 3L with no improvement (pt mouth breathers and difficulty maintianing PLB except with constant cues). SpO2 mid 90s on 2L [place] at rest    Pertinent Vitals/ Pain       Pain Assessment: No/denies pain   Frequency  Min 1X/week        Progress Toward Goals  OT Goals(current goals can now be found in the care plan section)  Progress towards OT goals: Progressing toward goals  Acute Rehab OT Goals Patient Stated Goal: go home OT Goal Formulation: With patient Time For Goal Achievement: 06/25/21 Potential to Achieve Goals: Good ADL Goals Pt Will Perform Lower Body Dressing: sitting/lateral leans;with min assist Pt Will Transfer to Toilet: with supervision;stand pivot transfer;regular height toilet Additional ADL Goal #1: Pt will be able to identify/demonstrate 2+ energy conservation techniques  Plan Discharge plan remains appropriate;Frequency remains appropriate    Co-evaluation                 AM-PAC OT "6 Clicks" Daily Activity     Outcome Measure   Help from another person eating meals?: None Help from another person taking care of personal grooming?: A Little Help from another person toileting, which includes using toliet, bedpan, or urinal?: A Lot Help from another person bathing (including washing, rinsing, drying)?: A Little Help from another person  to put on and taking off regular upper body clothing?: A Little Help from another person to put on and taking off regular lower body clothing?: A Lot 6 Click Score: 17    End of Session Equipment Utilized During Treatment: Rolling walker  OT Visit Diagnosis: Unsteadiness on feet (R26.81);Muscle weakness (generalized) (M62.81)   Activity Tolerance Patient tolerated treatment well   Patient Left in bed;with call bell/phone within reach;with bed alarm set   Nurse Communication Mobility status        Time: 1021-1050 OT Time Calculation (min): 29 min  Charges: OT General Charges $OT Visit: 1 Visit OT Treatments $Self Care/Home Management : 23-37 mins  Dessie Coma, M.S. OTR/L  06/15/21, 11:33 AM  ascom [phone]

## 2021-06-16 MED ORDER — METOPROLOL TARTRATE 25 MG PO TABS
25.0000 mg | ORAL_TABLET | Freq: Two times a day (BID) | ORAL | Status: DC
  Administered 2021-06-16: 25 mg via ORAL
  Filled 2021-06-16: qty 1

## 2021-06-16 NOTE — TOC Progression Note (Signed)
Transition of Care [facility]) - Progression Note    Patient Details  Name: [name] MRN: [id number] Date of Birth: [birth year]  Transition of Care [facility]) CM/SW [place], RN Phone Number: 06/16/2021, 1:27 PM  Clinical Narrative:   Patient continues to wait until 9/19 for discharge following COVID quarantine.  Patient will discharge to [facility].  TOC will follow to discharge.    Expected Discharge Plan: [place] Barriers to Discharge: Continued Medical Work up  Expected Discharge Plan and Services Expected Discharge Plan: [place]   Discharge Planning Services: CM Consult Post Acute Care Choice: [place] Living arrangements for the past 2 months: Single Family Home                         Representative spoke with at DME Agency: Patient being discharged to SNF         Representative spoke with at [place]: Patient being discharged to SNF   Social Determinants of Health (SDOH) Interventions    Readmission Risk Interventions Readmission Risk Prevention Plan 08/06/2020  Transportation Screening Complete  PCP or Specialist Appt within 3-5 Days Complete  HRI or [place] Complete  Social Work Consult for [place] Planning/Counseling Complete  Palliative Care Screening Not Applicable  Medication Review [employer]) Complete  Some recent data might be hidden

## 2021-06-16 NOTE — Progress Notes (Signed)
[facility]  ROUNDING NOTE   Subjective:   Husband at bedside. Hemodialysis yesterday. UF of 2. She feels that her dialysis treatment was "rough"   Objective:  Vital signs in last 24 hours:  Temp:  [97.5 F (36.4 C)-98.2 F (36.8 C)] 98 F (36.7 C) (09/15 1140) Pulse Rate:  [68-80] 70 (09/15 1140) Resp:  [14-24] 18 (09/15 1140) BP: (91-136)/(30-59) 118/53 (09/15 1140) SpO2:  [92 %-99 %] 98 % (09/15 1140)  Weight change:  Filed Weights   06/09/21 0802  Weight: 86.2 kg    Intake/Output: I/O last 3 completed shifts: In: 200 [P.O.:200] Out: 2250 [Urine:250; Other:2000]   Intake/Output this shift:  No intake/output data recorded.  Physical Exam: General: NAD, resting in bed  Head: Normocephalic, atraumatic. Moist oral mucosal membranes  Eyes: Anicteric  Lungs:  Clear to auscultation, normal effort, [place] O2  Heart: Regular rate and rhythm  Abdomen:  Soft, nontender   Extremities:  + peripheral edema.Bilateral wrappings.   Neurologic: Nonfocal, moving all four extremities  Skin: No lesions  Access: Lt AVF    Basic Metabolic Panel: Recent Labs  Lab 06/10/21 0508 06/11/21 0610 06/12/21 0525 06/13/21 0515 06/14/21 0507 06/15/21 0349  NA 133* 136 134* 132* 131* 130*  K 4.1 3.8 4.2 4.6 4.3 4.4  CL 100 99 99 98 94* 94*  CO2 26 29 24 25 27 25   GLUCOSE 88 95 80 93 82 88  BUN 48* 35* 47* 62* 55* 67*  CREATININE 6.29* 4.76* 5.92* 6.95* 6.22* 6.91*  CALCIUM 7.4* 7.9* 7.6* 7.4* 7.2* 7.1*  MG 2.0  --   --   --   --   --      Liver Function Tests: Recent Labs  Lab 06/10/21 0508 06/11/21 0610 06/12/21 0525 06/13/21 0515 06/14/21 0507  AST 7* 8* <5* <5* 7*  ALT 5 6 5 6 5   ALKPHOS 80 89 77 93 90  BILITOT 0.5 0.7 0.7 0.7 0.7  PROT 6.7 7.5 6.2* 7.1 7.0  ALBUMIN 2.2* 2.6* 2.0* 2.3* 2.2*    No results for input(s): LIPASE, AMYLASE in the last 168 hours.  No results for input(s): AMMONIA in the last 168 hours.  CBC: Recent Labs  Lab  06/10/21 0508 06/11/21 0610 06/12/21 0525 06/13/21 0515 06/14/21 0507 06/15/21 0349  WBC 4.7 4.6 5.5 5.9 7.0 7.7  NEUTROABS 2.9  --   --   --   --   --   HGB 8.2* 9.8* 9.1* 9.9* 9.7* 9.9*  HCT 26.9* 32.2* 30.7* 32.0* 32.1* 32.2*  MCV 101.5* 99.4 100.7* 99.1 98.5 97.0  PLT 151 175 172 220 213 240     Cardiac Enzymes: No results for input(s): CKTOTAL, CKMB, CKMBINDEX, TROPONINI in the last 168 hours.  BNP: Invalid input(s): POCBNP  CBG: Recent Labs  Lab 06/10/21 0755 06/10/21 1306 06/10/21 2241 06/11/21 0911 06/11/21 1215  GLUCAP 83 91 109* 74 98     Microbiology: Results for orders placed or performed during the hospital encounter of 06/09/21  SARS CORONAVIRUS 2 (TAT 6-24 HRS) Nasopharyngeal Nasopharyngeal Swab     Status: Abnormal   Collection Time: 06/09/21 12:24 PM   Specimen: Nasopharyngeal Swab  Result Value Ref Range Status   SARS Coronavirus 2 POSITIVE (A) NEGATIVE Final    Comment: (NOTE) SARS-CoV-2 target nucleic acids are DETECTED.  The SARS-CoV-2 RNA is generally detectable in upper and lower respiratory specimens during the acute phase of infection. Positive results are indicative of the presence of SARS-CoV-2 RNA.  Clinical correlation with patient history and other diagnostic information is  necessary to determine patient infection status. Positive results do not rule out bacterial infection or co-infection with other viruses.  The expected result is Negative.  Fact Sheet for Patients: SugarRoll.be  Fact Sheet for Healthcare Providers: https://www.woods-mathews.com/  This test is not yet approved or cleared by the [place] FDA and  has been authorized for detection and/or diagnosis of SARS-CoV-2 by FDA under an Emergency Use Authorization (EUA). This EUA will remain  in effect (meaning this test can be used) for the duration of the COVID-19 declaration under Section 564(b)(1) of the Act, 21 U.  S.C. section 360bbb-3(b)(1), unless the authorization is terminated or revoked sooner.   Performed at [facility], [place] [address], [place], [place] [address]     Coagulation Studies: No results for input(s): LABPROT, INR in the last 72 hours.  Urinalysis: No results for input(s): COLORURINE, LABSPEC, PHURINE, GLUCOSEU, HGBUR, BILIRUBINUR, KETONESUR, PROTEINUR, UROBILINOGEN, NITRITE, LEUKOCYTESUR in the last 72 hours.  Invalid input(s): APPERANCEUR    Imaging: No results found.   Medications:      aspirin  81 mg Oral Q M,W,F   Chlorhexidine Gluconate Cloth  6 each Topical Q0600   clopidogrel  75 mg Oral Daily   epoetin (EPOGEN/PROCRIT) injection  10,000 Units Intravenous Q M,W,F-HD   famotidine  10 mg Oral Daily   gabapentin  100 mg Oral BID   heparin  5,000 Units Subcutaneous Q8H   hydrALAZINE  25 mg Oral TID   levothyroxine  150 mcg Oral Q0600   metoprolol tartrate  50 mg Oral BID   torsemide  100 mg Oral Once per day on Tue Thu Sat   ALPRAZolam, benzonatate, HYDROcodone-acetaminophen, ondansetron **OR** ondansetron (ZOFRAN) IV  Assessment/ Plan:  [name] is a 87 y.o. white female with end stage renal disease on hemodialysis secondary to pauci-immune glomerulonephritis,hypertension, hypothyroidism, breast cancer, coronary artery disease status post CABG, congestive heart failure, GERD, hyperlipidemia, neuropathy, CVA who is admitted to [facility] on 06/09/2021 for Generalized weakness [R53.1] ESRD on hemodialysis ([place]) [N18.6, Z99.2] COVID-19 virus infection [U07.1]  [facility] MWF [place] Left AVF 82kg  End Stage Renal Disease:  Continue MWF schedule. Dialysis for tomorrow.   Hypertension: 118/53   - current regimen of metoprolol, hydralazine and torsemide.    Anemia of chronic kidney disease: hemoglobin 9.9. ESA held at this time  Secondary Hyperparathyroidism: not currently on binders.     LOS: 6 [name] 9/15/20221:39 PM

## 2021-06-16 NOTE — Care Management Important Message (Signed)
Important Message  Patient Details  Name: [name] MRN: [id number] Date of Birth: May 04, 1934   Medicare Important Message Given:  Yes  Patient is in an isolation room and I talked with her by phone [phone]) and reviewed the Important Message from Medicare with her. She is in agreement with her discharge.  I asked is she would like a copy and she declined it.  I wished her a speedy recovery and thanked her for her time.   [name] A Sharita Bienaime 06/16/2021, 9:50 AM

## 2021-06-16 NOTE — Progress Notes (Signed)
Patient ID: [name], female   DOB: Jun 07, 1934, 87 y.o.   MRN: [id number] [facility] PROGRESS NOTE  EMMAMARIE KLUENDER XBJ:[id number] DOB: 10/28/1933 DOA: 06/09/2021 PCP: Idelle Crouch, MD  HPI/Subjective: Patient awakened from sleep.  Still feeling weak.  Admitted with COVID-19 pneumonia.  Objective: Vitals:   06/16/21 0755 06/16/21 1140  BP: (!) 91/45 (!) 118/53  Pulse: 68 70  Resp: 16 18  Temp: 98.2 F (36.8 C) 98 F (36.7 C)  SpO2: 98% 98%    Intake/Output Summary (Last 24 hours) at 06/16/2021 1538 Last data filed at 06/15/2021 2250 Gross per 24 hour  Intake 100 ml  Output 2000 ml  Net -1900 ml   Filed Weights   06/09/21 0802  Weight: 86.2 kg    ROS: Review of Systems  Respiratory:  Positive for cough and shortness of breath.   Cardiovascular:  Negative for chest pain.  Gastrointestinal:  Negative for abdominal pain, nausea and vomiting.  Exam: Physical Exam HENT:     Head: Normocephalic.     Mouth/Throat:     Pharynx: No oropharyngeal exudate.  Eyes:     General: Lids are normal.     Conjunctiva/sclera: Conjunctivae normal.  Cardiovascular:     Rate and Rhythm: Normal rate and regular rhythm.     Heart sounds: Normal heart sounds, S1 normal and S2 normal.  Pulmonary:     Breath sounds: Examination of the right-lower field reveals decreased breath sounds. Examination of the left-lower field reveals decreased breath sounds. Decreased breath sounds present. No wheezing, rhonchi or rales.  Abdominal:     Palpations: Abdomen is soft.     Tenderness: There is no abdominal tenderness.  Musculoskeletal:     Right lower leg: Swelling present.     Left lower leg: Swelling present.  Skin:    General: Skin is warm.     Comments: Bilateral lower extremities covered.  Neurological:     Mental Status: She is alert.     Comments: Answers some questions appropriately      Scheduled Meds:  aspirin  81 mg Oral Q M,W,F   Chlorhexidine Gluconate Cloth   6 each Topical Q0600   clopidogrel  75 mg Oral Daily   epoetin (EPOGEN/PROCRIT) injection  10,000 Units Intravenous Q M,W,F-HD   famotidine  10 mg Oral Daily   gabapentin  100 mg Oral BID   heparin  5,000 Units Subcutaneous Q8H   hydrALAZINE  25 mg Oral TID   levothyroxine  150 mcg Oral Q0600   metoprolol tartrate  50 mg Oral BID   torsemide  100 mg Oral Once per day on Tue Thu Sat     Assessment/Plan:  COVID-19 pneumonia.  Completed remdesivir course.  We will check inflammatory markers again tomorrow. Weakness.  Physical therapy and occupational therapy still recommending rehab.  Would be able to go out to rehab on 06/20/2021. Acute on chronic hypoxic respiratory failure.  Patient currently on 3 L but normally wears 2 L. End-stage renal disease on hemodialysis Monday Wednesday Friday Deep tissue injury unstageable pressure ulcer, present on admission.  See full description below.  Recent bacteremia with sphingobacterium multivorum. Neuropathy on gabapentin Peripheral vascular disease on aspirin and Plavix Essential hypertension with blood pressure being on the lower side discontinue hydralazine and decrease dose of metoprolol. Chronic combined CHF managed with hemodialysis.  Last EF 35 to 40%.  Decrease dose of metoprolol.  Discontinue hydralazine with blood pressure being on the lower side. Chronic pain on  Norco Hypothyroidism unspecified on levothyroxine  Pressure Injury 08/04/20 Sacrum Medial Stage 2 -  Partial thickness loss of dermis presenting as a shallow open injury with a red, pink wound bed without slough. (Active)  08/04/20 2300  Location: Sacrum  Location Orientation: Medial  Staging: Stage 2 -  Partial thickness loss of dermis presenting as a shallow open injury with a red, pink wound bed without slough.  Wound Description (Comments):   Present on Admission: Yes     Pressure Injury 05/24/21 Foot Anterior;Left Unstageable - Full thickness tissue loss in which the base  of the injury is covered by slough (yellow, tan, gray, green or brown) and/or eschar (tan, brown or black) in the wound bed. (Active)  05/24/21 1800  Location: Foot  Location Orientation: Anterior;Left  Staging: Unstageable - Full thickness tissue loss in which the base of the injury is covered by slough (yellow, tan, gray, green or brown) and/or eschar (tan, brown or black) in the wound bed.  Wound Description (Comments):   Present on Admission: Yes     Pressure Injury 06/09/21 Heel Left Deep Tissue Pressure Injury - Purple or maroon localized area of discolored intact skin or blood-filled blister due to damage of underlying soft tissue from pressure and/or shear. (Active)  06/09/21 2100  Location: Heel  Location Orientation: Left  Staging: Deep Tissue Pressure Injury - Purple or maroon localized area of discolored intact skin or blood-filled blister due to damage of underlying soft tissue from pressure and/or shear.  Wound Description (Comments):   Present on Admission: Yes       Code Status:     Code Status Orders  (From admission, onward)           Start     Ordered   06/09/21 1415  Do not attempt resuscitation (DNR)  Continuous       Question Answer Comment  In the event of cardiac or respiratory ARREST Do not call a "code blue"   In the event of cardiac or respiratory ARREST Do not perform Intubation, CPR, defibrillation or ACLS   In the event of cardiac or respiratory ARREST Use medication by any route, position, wound care, and other measures to relive pain and suffering. May use oxygen, suction and manual treatment of airway obstruction as needed for comfort.      06/09/21 1414           Code Status History     Date Active Date Inactive Code Status Order ID Comments User Context   06/09/2021 1229 06/09/2021 1414 Full Code [id number]  CoxBriant Cedar, DO ED   05/21/2021 0944 05/26/2021 0224 DNR [id number]  Collier Bullock, MD ED   05/21/2021 [phone] 05/21/2021 0944 DNR  [id number]  Collier Bullock, MD ED   05/21/2021 0542 05/21/2021 0938 Full Code [id number]  Athena Masse, MD ED   08/04/2020 0535 08/11/2020 1917 Full Code [id number]  Athena Masse, MD ED   06/22/2020 1908 07/02/2020 1918 Full Code [id number]  Gwynne Edinger, MD ED   05/04/2016 1336 05/09/2016 1740 Full Code [id number]  Lytle Butte, MD Inpatient   04/28/2016 0157 05/02/2016 1656 Full Code [id number]  Harrie Foreman, MD ED   07/17/2015 1111 07/20/2015 1432 Full Code [id number]  Bettey Costa, MD Inpatient   09/10/2012 1956 09/23/2012 1532 Full Code [id number]  Lorraine Lax, RN Inpatient   09/09/2012 0817 09/10/2012 1956 Full Code [id number]  Rexene Alberts, MD Inpatient   09/05/2012 1324 09/09/2012 0817 Full Code  [id number]  Edwyna Perfect, RN Inpatient      Advance Directive Documentation    Flowsheet Row Most Recent Value  Type of Advance Directive Living will  Pre-existing out of facility DNR order (yellow form or pink MOST form) --  "MOST" Form in Place? --      Family Communication: Left message for husband Disposition Plan: Status is: Inpatient  Dispo: The patient is from: Home              Anticipated d/c is to: Rehab earliest on 06/20/2021              Patient currently still in [place] isolation   Difficult to place patient.  No  Consultants: Nephrology Wound care  Time spent: 28 minutes  [place]

## 2021-06-16 NOTE — Progress Notes (Addendum)
Patient's isolation will be completed tomorrow with the dialysis center. When discharged patient can resume MWF 11:30am chair time at [facility].

## 2021-06-16 NOTE — Progress Notes (Signed)
Physical Therapy Treatment Patient Details Name: [name] MRN: [id number] DOB: [birth year] Today's Date: 06/16/2021   History of Present Illness [name] is a 87 y.o. 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    PT Comments    Pt received supine in bed agreeable to PT services. Resting on 3L/min via [place]. PT donned SPO2 ear reader to monitor SPO2 with OOB mobility. Maintained >90% during entirety of session even with OOB activity. Pt requiring minA with HOB elevated to transfer from supine to seated EoB and remains total assist to don socks. Min Supervision pt able to stand to RW but required x2 bouts before successfully standing. Today pt amb 20' total with no seated rest in between but at very slow cadence. Pt returning to seated Eob with poor eccentric control and safety awareness/sequencing of RW despite PT VC's. This required modA+1 to ensure pt's buttocks landed on bed for safety. Pt reporting DOE after amb 20' although SPO2 remaining 93-95% post amb. Requesting to be done with PT due to difficulty breathing and fatigue. 5 min of seated rest provided. During seated rest, extensive education provided on PLB and benefits along with max VC's on how to perform correctly. Pt stating "I know, I know" but unable to perform despite extensive cues and education and demo. PT encouraging pt to attempt LE exercise which pt is willing to participate after rest. Pt  remains unable to fully extend R knee due to previous CVA and has poor hip flexion at 90/90 due to proximal weakness. Post therex pt minA at LE's to return to supine. Pt still requires STR due to limitations with basic, short distance ambulation and inability of spouse to perform needed assist required if pt would return to home environment.    Recommendations for follow up therapy are one component of a multi-disciplinary discharge planning process, led by the attending physician.  Recommendations may be updated based on patient status, additional functional criteria and insurance authorization.  Follow Up Recommendations  SNF;Supervision for mobility/OOB     Equipment Recommendations  Other (comment) (next venue of care)    Recommendations for Other Services       Precautions / Restrictions Precautions Precautions: Fall Precaution Comments: airborne/contact precautions Restrictions Weight Bearing Restrictions: No     Mobility  Bed Mobility Overal bed mobility: Needs Assistance Bed Mobility: Supine to Sit;Sit to Supine     Supine to sit: Min assist Sit to supine: Min assist   General bed mobility comments: minA for LE's returning to bed. Patient Response: Cooperative  Transfers Overall transfer level: Needs assistance Equipment used: Rolling walker (2 wheeled) Transfers: Sit to/from Stand Sit to Stand: Min assist         General transfer comment: minA from bed. x2 attempts to complete successfully.  Ambulation/Gait Ambulation/Gait assistance: Supervision Gait Distance (Feet): 20 Feet Assistive device: Rolling walker (2 wheeled) Gait Pattern/deviations: Shuffle;Trunk flexed     General Gait Details: Able to amb without significant SOB. Good sequencing of RW with turning and did not require seated rest break during bout.   Stairs  Wheelchair Mobility    Modified Rankin (Stroke Patients Only)       Balance Overall balance assessment:  Needs assistance Sitting-balance support: Bilateral upper extremity supported;Feet supported Sitting balance-Leahy Scale: Good Sitting balance - Comments: Can maintain static sitting balance without external support.   Standing balance support: Single extremity supported;During functional activity Standing balance-Leahy Scale: Fair Standing balance comment: Use of RW                            Cognition Arousal/Alertness: Awake/alert Behavior During Therapy: WFL for tasks assessed/performed Overall Cognitive Status: Within Functional Limits for tasks assessed                                 General Comments: Continues to display poor ability to perform PLB despite significant cuing and education      Exercises General Exercises - Lower Extremity Long Arc Quad: AROM;Strengthening;Both;10 reps;Seated Hip Flexion/Marching: AROM;Strengthening;Both;10 reps Other Exercises Other Exercises: Extensive education on benefits of PLB.    General Comments General comments (skin integrity, edema, etc.): SPo2 remained > 90% during entirety of session while on 3 L/min [place].      Pertinent Vitals/Pain Pain Assessment: Faces Faces Pain Scale: Hurts a little bit Pain Location: R shoulder Pain Intervention(s): Limited activity within patient's tolerance    Home Living                      Prior Function            PT Goals (current goals can now be found in the care plan section) Acute Rehab PT Goals Patient Stated Goal: go home PT Goal Formulation: With patient Time For Goal Achievement: 06/24/21 Potential to Achieve Goals: Fair Progress towards PT goals: Progressing toward goals    Frequency    Min 2X/week      PT Plan Current plan remains appropriate    Co-evaluation              AM-PAC PT "6 Clicks" Mobility   Outcome Measure  Help needed turning from your back to your side while in a flat bed without using bedrails?: A Lot Help  needed moving from lying on your back to sitting on the side of a flat bed without using bedrails?: A Lot Help needed moving to and from a bed to a chair (including a wheelchair)?: A Little Help needed standing up from a chair using your arms (e.g., wheelchair or bedside chair)?: A Little Help needed to walk in hospital room?: A Little Help needed climbing 3-5 steps with a railing? : A Lot 6 Click Score: 15    End of Session Equipment Utilized During Treatment: Gait belt;Oxygen Activity Tolerance: Patient limited by fatigue;Patient limited by lethargy Patient left: in bed;with bed alarm set;with call bell/phone within reach Nurse Communication: Mobility status PT Visit Diagnosis: Muscle weakness (generalized) (M62.81);Difficulty in walking, not elsewhere classified (R26.2)     Time: 9833-8250 PT Time Calculation (min) (ACUTE ONLY): 31 min  Charges:  $Gait Training: 8-22 mins $Therapeutic Activity: 8-22 mins                     Judd Mccubbin M. Fairly IV, PT, DPT Physical Therapist- [facility]  06/16/2021, 3:33 PM

## 2021-06-17 ENCOUNTER — Inpatient Hospital Stay: Payer: Medicare Other

## 2021-06-17 DIAGNOSIS — I739 Peripheral vascular disease, unspecified: Secondary | ICD-10-CM

## 2021-06-17 DIAGNOSIS — G9341 Metabolic encephalopathy: Secondary | ICD-10-CM

## 2021-06-17 LAB — BASIC METABOLIC PANEL
Anion gap: 7 (ref 5–15)
BUN: 53 mg/dL — ABNORMAL HIGH (ref 8–23)
CO2: 29 mmol/L (ref 22–32)
Calcium: 7.4 mg/dL — ABNORMAL LOW (ref 8.9–10.3)
Chloride: 94 mmol/L — ABNORMAL LOW (ref 98–111)
Creatinine, Ser: 6.11 mg/dL — ABNORMAL HIGH (ref 0.44–1.00)
GFR, Estimated: 6 mL/min — ABNORMAL LOW (ref >60)
Glucose, Bld: 82 mg/dL (ref 70–99)
Potassium: 4 mmol/L (ref 3.5–5.1)
Sodium: 130 mmol/L — ABNORMAL LOW (ref 135–145)

## 2021-06-17 LAB — C-REACTIVE PROTEIN: CRP: 9.5 mg/dL — ABNORMAL HIGH (ref <1.0)

## 2021-06-17 LAB — CBC
HCT: 32.3 % — ABNORMAL LOW (ref 36.0–46.0)
Hemoglobin: 10.1 g/dL — ABNORMAL LOW (ref 12.0–15.0)
MCH: 31.3 pg (ref 26.0–34.0)
MCHC: 31.3 g/dL (ref 30.0–36.0)
MCV: 100 fL (ref 80.0–100.0)
Platelets: 210 10*3/uL (ref 150–400)
RBC: 3.23 MIL/uL — ABNORMAL LOW (ref 3.87–5.11)
RDW: 16.4 % — ABNORMAL HIGH (ref 11.5–15.5)
WBC: 7.8 10*3/uL (ref 4.0–10.5)
nRBC: 0 % (ref 0.0–0.2)

## 2021-06-17 LAB — BLOOD GAS, ARTERIAL
Acid-Base Excess: 4.9 mmol/L — ABNORMAL HIGH (ref 0.0–2.0)
Bicarbonate: 30.8 mmol/L — ABNORMAL HIGH (ref 20.0–28.0)
FIO2: 0.28
O2 Saturation: 97.2 %
Patient temperature: 37
pCO2 arterial: 52 mmHg — ABNORMAL HIGH (ref 32.0–48.0)
pH, Arterial: 7.38 (ref 7.350–7.450)
pO2, Arterial: 95 mmHg (ref 83.0–108.0)

## 2021-06-17 LAB — D-DIMER, QUANTITATIVE: D-Dimer, Quant: 1.21 ug/mL-FEU — ABNORMAL HIGH (ref 0.00–0.50)

## 2021-06-17 MED ORDER — ACETAMINOPHEN 325 MG PO TABS
650.0000 mg | ORAL_TABLET | Freq: Four times a day (QID) | ORAL | Status: DC | PRN
  Administered 2021-06-17 – 2021-06-18 (×2): 650 mg via ORAL
  Filled 2021-06-17 (×2): qty 2

## 2021-06-17 MED ORDER — METOPROLOL TARTRATE 25 MG PO TABS
12.5000 mg | ORAL_TABLET | Freq: Two times a day (BID) | ORAL | Status: DC
  Administered 2021-06-17 – 2021-06-20 (×6): 12.5 mg via ORAL
  Filled 2021-06-17 (×7): qty 1

## 2021-06-17 NOTE — Progress Notes (Signed)
OT Cancellation Note  Patient Details Name: [name] MRN: [id number] DOB: 02-27-1934   Cancelled Treatment:    Reason Eval/Treat Not Completed: Medical issues which prohibited therapy. OT continues to follow pt with for therapy services. Pt noted with BP's soft/trending down (115/29 at most recent recorded values) and pending additional work up. Will hold at this time and re-attempt at a later date/time as available and pt medically appropriate for OT services.   Shara Blazing, M.S., OTR/L Ascom: [phone] 06/17/21, 12:37 PM

## 2021-06-17 NOTE — Progress Notes (Addendum)
Patient ID: [name], female   DOB: 10-01-1934, 87 y.o.   MRN: [id number] [facility] PROGRESS NOTE  [name]:[id number] DOB: Nov 22, 1933 DOA: 06/09/2021 PCP: Idelle Crouch, MD  HPI/Subjective: Patient seen while on dialysis.  Answered a few questions.  Offers no complaints and went back to sleep easily.  Some shortness of breath.  Had a cut back on blood pressure medications over the last couple days secondary to relative hypotension.  Today with worsening confusion.  Objective: Vitals:   06/17/21 1129 06/17/21 1300  BP: (!) 115/29 122/60  Pulse: 72 72  Resp: 14 18  Temp:  98 F (36.7 C)  SpO2: 97% 98%    Intake/Output Summary (Last 24 hours) at 06/17/2021 1453 Last data filed at 06/17/2021 1129 Gross per 24 hour  Intake 60 ml  Output 2000 ml  Net -1940 ml   Filed Weights   06/09/21 0802  Weight: 86.2 kg    ROS: Review of Systems  Respiratory:  Positive for shortness of breath.   Cardiovascular:  Negative for chest pain.  Gastrointestinal:  Negative for abdominal pain.  Exam: Physical Exam HENT:     Head: Normocephalic.     Mouth/Throat:     Pharynx: No oropharyngeal exudate.  Eyes:     General: Lids are normal.     Conjunctiva/sclera: Conjunctivae normal.  Cardiovascular:     Rate and Rhythm: Normal rate and regular rhythm.     Heart sounds: S1 normal and S2 normal. Murmur heard.  Systolic murmur is present with a grade of 2/6.  Pulmonary:     Breath sounds: No decreased breath sounds, wheezing, rhonchi or rales.  Abdominal:     Palpations: Abdomen is soft.     Tenderness: There is no abdominal tenderness.  Musculoskeletal:     Right lower leg: Swelling present.     Left lower leg: Swelling present.  Skin:    General: Skin is warm.     Findings: No rash.     Comments: Lower extremities wrapped.  Neurological:     Mental Status: She is confused.     Comments: Able to straight leg raise.      Scheduled Meds:  aspirin  81 mg  Oral Q M,W,F   Chlorhexidine Gluconate Cloth  6 each Topical Q0600   clopidogrel  75 mg Oral Daily   epoetin (EPOGEN/PROCRIT) injection  10,000 Units Intravenous Q M,W,F-HD   famotidine  10 mg Oral Daily   heparin  5,000 Units Subcutaneous Q8H   levothyroxine  150 mcg Oral Q0600   metoprolol tartrate  12.5 mg Oral BID   torsemide  100 mg Oral Once per day on Tue Thu Sat     Assessment/Plan:  Acute metabolic encephalopathy.  Discontinue gabapentin.  Use pain medication sparingly.  CT scan of the head showed no acute intracranial abnormality, chronic small left occipital cortical infarct, increased chronic left frontoparietal infarcts, similar chronic microvascular ischemic changes as previous.  We will get MRI of the brain.  B12 level high, TSH normal range.  We will add on an RPR.  Could also be secondary to COVID-19 encephalopathy.  We will get an ABG COVID-19 pneumonia.  Completed remdesivir. Weakness.  Physical therapy recommending rehab. Acute on chronic hypoxic respiratory failure.  Currently on 3 L. End-stage renal disease on hemodialysis Monday Wednesday Friday Deep tissue injury unstageable pressure ulcer, present on admission.  Appreciate wound care note.  recent bacteremia with Sphingobacterium multivorum. Peripheral vascular disease and history  of strokes on aspirin and Plavix Neuropathy.  Get rid of gabapentin Chronic combined CHF managed with hemodialysis.  Last EF 35 to 40%.  Decrease dose of metoprolol again today.  Hydralazine discontinued.  Patient on torsemide. Chronic pain.  Limit Norco if possible Hypothyroidism unspecified on levothyroxine       Code Status:     Code Status Orders  (From admission, onward)           Start     Ordered   06/09/21 1415  Do not attempt resuscitation (DNR)  Continuous       Question Answer Comment  In the event of cardiac or respiratory ARREST Do not call a "code blue"   In the event of cardiac or respiratory ARREST Do not  perform Intubation, CPR, defibrillation or ACLS   In the event of cardiac or respiratory ARREST Use medication by any route, position, wound care, and other measures to relive pain and suffering. May use oxygen, suction and manual treatment of airway obstruction as needed for comfort.      06/09/21 1414           Code Status History     Date Active Date Inactive Code Status Order ID Comments User Context   06/09/2021 1229 06/09/2021 1414 Full Code [id number]  CoxBriant Cedar, DO ED   05/21/2021 0944 05/26/2021 0224 DNR [id number]  Collier Bullock, MD ED   05/21/2021 [phone] 05/21/2021 0944 DNR [id number]  Collier Bullock, MD ED   05/21/2021 0542 05/21/2021 0938 Full Code [id number]  Athena Masse, MD ED   08/04/2020 0535 08/11/2020 1917 Full Code [id number]  Athena Masse, MD ED   06/22/2020 1908 07/02/2020 1918 Full Code [id number]  Gwynne Edinger, MD ED   05/04/2016 1336 05/09/2016 1740 Full Code [id number]  Lytle Butte, MD Inpatient   04/28/2016 0157 05/02/2016 1656 Full Code [id number]  Harrie Foreman, MD ED   07/17/2015 1111 07/20/2015 1432 Full Code [id number]  Bettey Costa, MD Inpatient   09/10/2012 1956 09/23/2012 1532 Full Code [id number]  Lorraine Lax, RN Inpatient   09/09/2012 0817 09/10/2012 1956 Full Code [id number]  Rexene Alberts, MD Inpatient   09/05/2012 1324 09/09/2012 0817 Full Code [id number]  Edwyna Perfect, RN Inpatient      Advance Directive Documentation    Flowsheet Row Most Recent Value  Type of Advance Directive Living will  Pre-existing out of facility DNR order (yellow form or pink MOST form) --  "MOST" Form in Place? --      Family Communication: Spoke with husband late morning Disposition Plan: Status is: Inpatient  Dispo: The patient is from: Home              Anticipated d/c is to: Rehab              Patient currently with worsening mental status today   Difficult to place patient.  No.  Consultants: Nephrology  Time spent: 29 minutes  [place]

## 2021-06-17 NOTE — Progress Notes (Signed)
Palliative:  Consult received and chart reviewed.  Visited patient - she is very lethargic - wakes briefly to physical stimulation. Was at bedside for ABG and she did not respond to ABG attempts. Certainly unable to participate in goals of care discussions.  Per RN and case manager, patient's spouse is very hard of hearing - unable to discuss goals of care over the phone. He was at bedside earlier but has left for the day.  Patient has DNR on file. From chart review it seems patient was improving and preparing for discharge then had change in mental status today 9/16 - CT head negative. Sedating medications have been adjusted. Allow time for outcomes.   Will ask team member to follow up Monday for [place] discussion.   If patient is ready for discharge prior to PMT visit - please refer to outpatient palliative care through [facility].   Juel Burrow, DNP, AGNP-C Palliative Medicine Team Team Phone # [phone]  Pager # [phone]  NO CHARGE

## 2021-06-17 NOTE — Progress Notes (Signed)
[facility]  ROUNDING NOTE   Subjective:   Seen and examined on hemodialysis treatment. Tolerated treatment well.     HEMODIALYSIS FLOWSHEET:  Blood Flow Rate (mL/min): 400 mL/min Arterial Pressure (mmHg): -210 mmHg Venous Pressure (mmHg): 160 mmHg Transmembrane Pressure (mmHg): 60 mmHg Ultrafiltration Rate (mL/min): 1000 mL/min Dialysate Flow Rate (mL/min): 500 ml/min Conductivity: Machine : 13.6 Conductivity: Machine : 13.6 Dialysis Fluid Bolus: Normal Saline Bolus Amount (mL): 200 mL    Objective:  Vital signs in last 24 hours:  Temp:  [98 F (36.7 C)-98.2 F (36.8 C)] 98 F (36.7 C) (09/16 1300) Pulse Rate:  [65-82] 72 (09/16 1300) Resp:  [14-18] 18 (09/16 1300) BP: (103-136)/(29-60) 122/60 (09/16 1300) SpO2:  [97 %-100 %] 98 % (09/16 1300)  Weight change:  Filed Weights   06/09/21 0802  Weight: 86.2 kg    Intake/Output: I/O last 3 completed shifts: In: 100 [P.O.:100] Out: -    Intake/Output this shift:  Total I/O In: 60 [P.O.:60] Out: 2000 [Other:2000]  Physical Exam: General: NAD, resting in bed  Head: Normocephalic, atraumatic. Moist oral mucosal membranes  Eyes: Anicteric  Lungs:  Clear to auscultation, normal effort,  O2  Heart: Regular rate and rhythm  Abdomen:  Soft, nontender   Extremities:  + peripheral edema.Bilateral wrappings.   Neurologic: Nonfocal, moving all four extremities  Skin: No lesions  Access: Lt AVF    Basic Metabolic Panel: Recent Labs  Lab 06/12/21 0525 06/13/21 0515 06/14/21 0507 06/15/21 0349 06/17/21 0523  NA 134* 132* 131* 130* 130*  K 4.2 4.6 4.3 4.4 4.0  CL 99 98 94* 94* 94*  CO2 24 25 27 25 29   GLUCOSE 80 93 82 88 82  BUN 47* 62* 55* 67* 53*  CREATININE 5.92* 6.95* 6.22* 6.91* 6.11*  CALCIUM 7.6* 7.4* 7.2* 7.1* 7.4*     Liver Function Tests: Recent Labs  Lab 06/11/21 0610 06/12/21 0525 06/13/21 0515 06/14/21 0507  AST 8* <5* <5* 7*  ALT 6 5 6 5   ALKPHOS 89 77 93 90  BILITOT  0.7 0.7 0.7 0.7  PROT 7.5 6.2* 7.1 7.0  ALBUMIN 2.6* 2.0* 2.3* 2.2*    No results for input(s): LIPASE, AMYLASE in the last 168 hours.  No results for input(s): AMMONIA in the last 168 hours.  CBC: Recent Labs  Lab 06/12/21 0525 06/13/21 0515 06/14/21 0507 06/15/21 0349 06/17/21 0523  WBC 5.5 5.9 7.0 7.7 7.8  HGB 9.1* 9.9* 9.7* 9.9* 10.1*  HCT 30.7* 32.0* 32.1* 32.2* 32.3*  MCV 100.7* 99.1 98.5 97.0 100.0  PLT 172 220 213 240 210     Cardiac Enzymes: No results for input(s): CKTOTAL, CKMB, CKMBINDEX, TROPONINI in the last 168 hours.  BNP: Invalid input(s): POCBNP  CBG: Recent Labs  Lab 06/10/21 2241 06/11/21 0911 06/11/21 1215  GLUCAP 109* 74 98     Microbiology: Results for orders placed or performed during the hospital encounter of 06/09/21  SARS CORONAVIRUS 2 (TAT 6-24 HRS) Nasopharyngeal Nasopharyngeal Swab     Status: Abnormal   Collection Time: 06/09/21 12:24 PM   Specimen: Nasopharyngeal Swab  Result Value Ref Range Status   SARS Coronavirus 2 POSITIVE (A) NEGATIVE Final    Comment: (NOTE) SARS-CoV-2 target nucleic acids are DETECTED.  The SARS-CoV-2 RNA is generally detectable in upper and lower respiratory specimens during the acute phase of infection. Positive results are indicative of the presence of SARS-CoV-2 RNA. Clinical correlation with patient history and other diagnostic information is  necessary to  determine patient infection status. Positive results do not rule out bacterial infection or co-infection with other viruses.  The expected result is Negative.  Fact Sheet for Patients: SugarRoll.be  Fact Sheet for Healthcare Providers: https://www.woods-mathews.com/  This test is not yet approved or cleared by the [place] FDA and  has been authorized for detection and/or diagnosis of SARS-CoV-2 by FDA under an Emergency Use Authorization (EUA). This EUA will remain  in effect (meaning this  test can be used) for the duration of the COVID-19 declaration under Section 564(b)(1) of the Act, 21 U. S.C. section 360bbb-3(b)(1), unless the authorization is terminated or revoked sooner.   Performed at [facility], [place] [address], [place], [place] [address]     Coagulation Studies: No results for input(s): LABPROT, INR in the last 72 hours.  Urinalysis: No results for input(s): COLORURINE, LABSPEC, PHURINE, GLUCOSEU, HGBUR, BILIRUBINUR, KETONESUR, PROTEINUR, UROBILINOGEN, NITRITE, LEUKOCYTESUR in the last 72 hours.  Invalid input(s): APPERANCEUR    Imaging: CT HEAD WO CONTRAST (5MM)  Result Date: 06/17/2021 CLINICAL DATA:  Mental status change, unknown cause EXAM: CT HEAD WITHOUT CONTRAST TECHNIQUE: Contiguous axial images were obtained from the base of the skull through the vertex without intravenous contrast. COMPARISON:  July 2021 FINDINGS: Brain: There is no acute intracranial hemorrhage, mass effect, or edema. Chronic left frontoparietal infarcts have progressed since 2021. Age-indeterminate small left occipital cortical infarct. Additional patchy and confluent hypoattenuation in the supratentorial white matter is nonspecific but probably reflects similar moderate chronic microvascular ischemic changes. Small chronic infarct of the right caudate. Prominence of the ventricles and sulci reflects similar parenchymal volume loss. There is no extra-axial fluid collection. Vascular: There is atherosclerotic calcification at the skull base. Skull: Calvarium is unremarkable. Sinuses/Orbits: Paranasal sinus mucosal thickening with significant partial opacification of maxillary and sphenoid sinuses. No significant orbital abnormality. Other: Left greater than right mastoid effusions. Left middle ear effusion. IMPRESSION: No acute intracranial hemorrhage. Age-indeterminate but favored to be chronic small left occipital cortical infarct. Increased chronic left frontoparietal infarcts.  Similar chronic microvascular ischemic changes. Nonspecific paranasal sinus inflammatory changes and mastoid effusions. Electronically Signed   By: Macy Mis M.D.   On: 06/17/2021 14:43     Medications:      aspirin  81 mg Oral Q M,W,F   Chlorhexidine Gluconate Cloth  6 each Topical Q0600   clopidogrel  75 mg Oral Daily   epoetin (EPOGEN/PROCRIT) injection  10,000 Units Intravenous Q M,W,F-HD   famotidine  10 mg Oral Daily   heparin  5,000 Units Subcutaneous Q8H   levothyroxine  150 mcg Oral Q0600   metoprolol tartrate  12.5 mg Oral BID   torsemide  100 mg Oral Once per day on Tue Thu Sat   acetaminophen, ALPRAZolam, benzonatate, HYDROcodone-acetaminophen, ondansetron **OR** ondansetron (ZOFRAN) IV  Assessment/ Plan:  Ms. BRAIDYN PEACE is a 87 y.o. white female with end stage renal disease on hemodialysis secondary to pauci-immune glomerulonephritis,hypertension, hypothyroidism, breast cancer, coronary artery disease status post CABG, congestive heart failure, GERD, hyperlipidemia, neuropathy, CVA who is admitted to [facility] on 06/09/2021 for Generalized weakness [R53.1] ESRD on hemodialysis ([place]) [N18.6, Z99.2] COVID-19 virus infection [U07.1]  [facility] MWF [address] Left AVF 82kg  End Stage Renal Disease: seen and examined on hemodialysis. Tolerating treatment well.  Continue MWF schedule.   Hypertension: 122/60  - current regimen of metoprolol, hydralazine and torsemide.    Anemia of chronic kidney disease: hemoglobin 10.1. ESA held at this time  Secondary Hyperparathyroidism:  not currently on binders.     LOS: 7 [name] 9/16/20222:49 PM

## 2021-06-17 NOTE — Progress Notes (Signed)
Met husband in hallway, he was attempting to find someone to give information about his wife. He states he was not aware of her being in dialysis this morning. He said doctor called him yesterday but he did not get home until late evening therefore he missed the called. He is also hard of hearing and could not understand what was being said. I relayed the information and concerns to Tiffany the patient's nurse for today.

## 2021-06-17 NOTE — Progress Notes (Signed)
Patient to MRI in stable condition

## 2021-06-17 NOTE — Progress Notes (Signed)
Tolerated 2.5 hrs of HD treatment without complications. Cannulated without difficulty using 15g needle.  Prescribed BFR achieved, UF goal 2L met, report to primary nurse

## 2021-06-18 DIAGNOSIS — I5042 Chronic combined systolic (congestive) and diastolic (congestive) heart failure: Secondary | ICD-10-CM

## 2021-06-18 LAB — RPR: RPR Ser Ql: NONREACTIVE

## 2021-06-18 NOTE — Progress Notes (Signed)
Physical Therapy Treatment Patient Details Name: [name] MRN: [id number] DOB: 08/23/1934 Today's Date: 06/18/2021   History of Present Illness [name] is a 87 y.o. 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    PT Comments    Pt seen by request of MD today.  Husband in room voicing being frustrated with lack of mobility during stay and not having therapy. Explained to husband and pt that she has had multiple PT and OT session during stay and frequency is a minimum of 2 x per week per protocols and she has had more frequently than that along with OT sessions.  Yesterday therapy was held due to medical/cognitive concerns.  Told him that we try to provide therapy daily to all pt but sometimes that is not possible due to census and pt needs/condition.  Had discussed with Tech prior and she stated she was unable to stand this morning and she fell back into bed and was unable to stand and was returned to bed for safety.  Explained that nursing did try to get her up this morning but she was unable to do so at the time. He again focused on her need to get up multiple times a day to get stronger.  Again reviewed staff did try but she was unsafe to do so.  He seems generally frustrated and then stated "I don't know why she can't walk like she used to."  Explained medical and covid diagnosis and that it is not uncommon for her diagnosis to have general weakness.   Seemed more calm at end of session.  Pt seems to understand well limitations and needs.  She is able to get to EOB with verbal and tactile cues using rail.  Steady in sitting with no dizziness reported.  She is able to stand with min a x 1 and complete standing exercises at bedside with RW for support.  After seated rest she is able to walk 20' in room with RW, another short seated rest then an additional 20'.  Generally unsteady gait but no buckling noted this session.  She remains in chair with needs met.  Updated nursing on status at end of session.  Will continue to try to see pt as frequently as possible per husband request but daily PT may not be attainable but we would try.  Encouraged mobility with nursing to bathroom and OOB to chair daily.   Recommendations for follow up therapy are one component of a multi-disciplinary discharge planning process, led by the attending physician.  Recommendations may be updated based on patient status, additional functional criteria and insurance authorization.  Follow Up Recommendations  SNF;Supervision for mobility/OOB     Equipment Recommendations       Recommendations for Other Services       Precautions / Restrictions Precautions Precautions: Fall Precaution Comments: airborne/contact precautions Restrictions Weight Bearing Restrictions: No     Mobility  Bed Mobility Overal bed mobility: Needs Assistance Bed Mobility: Supine to Sit     Supine to sit: Min assist Sit to supine: Min assist   General bed mobility comments: cues for hand placements and technique  Transfers Overall transfer level: Needs assistance Equipment used: Rolling walker (2 wheeled) Transfers: Sit to/from Stand Sit to Stand: Min assist         General transfer comment: minA from bed and chair but some difficulty from low height of recliner  Ambulation/Gait Ambulation/Gait assistance: Min guard Gait Distance (Feet): 20 Feet Assistive device: Rolling  walker (2 wheeled) Gait Pattern/deviations: Step-through pattern;Decreased step length - right;Decreased step length - left Gait velocity: decreased   General Gait Details: able to walk 20' x 2 with seated rest before fatigue   Stairs             Wheelchair Mobility    Modified Rankin (Stroke Patients Only)       Balance Overall balance assessment: Needs assistance Sitting-balance support: Bilateral upper extremity supported;Feet supported Sitting balance-Leahy Scale: Good     Standing balance support: Bilateral upper extremity supported Standing balance-Leahy Scale: Fair Standing balance comment: Use of RW                            Cognition Arousal/Alertness: Awake/alert Behavior During Therapy: WFL for tasks assessed/performed Overall Cognitive Status: Within Functional Limits for tasks assessed                                        Exercises Other Exercises Other Exercises: standing ex x 10 prior to gait no knee buckling    General Comments        Pertinent Vitals/Pain Pain Assessment: No/denies pain    Home Living                      Prior Function            PT Goals (current goals can now be found in the care plan section) Progress towards PT goals: Progressing toward goals    Frequency    Min 2X/week      PT Plan Current plan remains appropriate    Co-evaluation              AM-PAC PT "6 Clicks" Mobility   Outcome Measure  Help needed turning from your back to your side while in a flat bed without using bedrails?: A Little Help needed moving from lying on your back to sitting on the side of a flat bed without using bedrails?: A Little Help needed moving to and from a bed to a chair (including a wheelchair)?: A Little Help needed standing up from a chair using your arms (e.g., wheelchair or bedside chair)?: A Little Help needed to walk in hospital room?: A Little Help needed climbing  3-5 steps with a railing? : A Lot 6 Click Score: 17    End of Session Equipment Utilized During Treatment: Gait belt;Oxygen Activity Tolerance: Patient tolerated treatment well Patient left: in chair;with call bell/phone within reach;with chair alarm set;with family/visitor present Nurse Communication: Mobility status PT Visit Diagnosis: Muscle weakness (generalized) (M62.81);Difficulty in walking, not elsewhere classified (R26.2)     Time: 0232-0259 PT Time Calculation (min) (ACUTE ONLY): 27 min  Charges:  $Gait Training: 8-22 mins $Therapeutic Exercise: 8-22 mins                    Chesley Noon, PTA 06/18/21, 3:16 PM

## 2021-06-18 NOTE — Progress Notes (Signed)
Patient ID: [name], female   DOB: Sep 28, 1934, 87 y.o.   MRN: [id number] [facility] PROGRESS NOTE  [name] [id number] DOB: [birth year] DOA: 06/09/2021 PCP: Idelle Crouch, MD  HPI/Subjective: Patient feeling better today.  Not lethargic today.  Mental status normal.  Able to straight leg raise.  Patient and family complaining that nobody comes in there for her.  Nursing staff stated that her legs collapsed on her when she tries to stand.  Objective: Vitals:   06/18/21 0900 06/18/21 1240  BP: (!) 148/54 (!) 148/56  Pulse: 83 87  Resp: 18 17  Temp: 98 F (36.7 C) 98.2 F (36.8 C)  SpO2: 91% 100%   No intake or output data in the 24 hours ending 06/18/21 1337 Filed Weights   06/09/21 0802  Weight: 86.2 kg    ROS: Review of Systems  Respiratory:  Positive for cough. Negative for shortness of breath.   Cardiovascular:  Negative for chest pain.  Gastrointestinal:  Negative for abdominal pain, nausea and vomiting.  Exam: Physical Exam HENT:     Head: Normocephalic.     Mouth/Throat:     Pharynx: No oropharyngeal exudate.  Eyes:     General: Lids are normal.     Conjunctiva/sclera: Conjunctivae normal.  Cardiovascular:     Rate and Rhythm: Normal rate and regular rhythm.     Heart sounds: Normal heart sounds, S1 normal and S2 normal.  Pulmonary:     Breath sounds: Examination of the right-lower field reveals decreased breath sounds and rhonchi. Examination of the left-lower field reveals decreased breath sounds and rhonchi. Decreased breath sounds and rhonchi present. No wheezing or rales.  Abdominal:     Palpations: Abdomen is soft.     Tenderness: There is no abdominal tenderness.  Musculoskeletal:     Right lower leg: Swelling present.     Left lower leg: Swelling present.  Skin:    General: Skin is warm.     Comments: Right lower extremity anteriorly: Small healing ulcer.   Left lower extremity anterior ulceration likely graded stage II at  this point.  Large amount of bruising in the left lower extremity.  Neurological:     Mental Status: She is alert.     Comments: Able to straight leg raise.  Answers all questions appropriately today.  Not lethargic today.      Scheduled Meds:  aspirin  81 mg Oral Q M,W,F   Chlorhexidine Gluconate Cloth  6 each Topical Q0600   clopidogrel  75 mg Oral Daily   epoetin (EPOGEN/PROCRIT) injection  10,000 Units Intravenous Q M,W,F-HD   famotidine  10 mg Oral Daily   heparin  5,000 Units Subcutaneous Q8H   levothyroxine  150 mcg Oral Q0600   metoprolol tartrate  12.5 mg Oral BID   torsemide  100 mg Oral Once per day on Tue Thu Sat     Assessment/Plan:  Acute metabolic Encephalopathy yesterday.  Mental status improved.  Off gabapentin.  Use pain medication sparingly.  MRI showed old strokes in the left occipital and frontal regions.  No new infarcts.  RPR normal range, B12 level high, TSH normal range.  ABG showed PCO2 slightly elevated at 52 but this would not cause altered mental status. COVID-19 pneumonia.  Completed remdesivir.  We will get out of isolation on 06/19/2021 and be able to go out to rehab on 06/20/2021. Weakness.  Physical therapy recommends rehab.  Nursing staff states that her legs give out when trying  to stand. Acute on chronic hypoxic respiratory failure.  Currently on 3 L. End-stage renal disease on hemodialysis Monday Wednesday Friday Left lower extremity stage II anterior ulcer, large amount of bruising left lower extremity.  Right lower extremity I would classify as stage I anterior ulcer. Peripheral vascular disease and history of strokes on aspirin and Plavix Neuropathy.  Continue to hold gabapentin Chronic combined CHF managed with hemodialysis.  Last EF 35 to 40%.  On lower dose metoprolol.  Hydralazine discontinued.  Patient on torsemide. Chronic pain.  Tylenol first.  Try to limit Norco. Hypothyroidism unspecified on levothyroxine       Code Status:      Code Status Orders  (From admission, onward)           Start     Ordered   06/09/21 1415  Do not attempt resuscitation (DNR)  Continuous       Question Answer Comment  In the event of cardiac or respiratory ARREST Do not call a "code blue"   In the event of cardiac or respiratory ARREST Do not perform Intubation, CPR, defibrillation or ACLS   In the event of cardiac or respiratory ARREST Use medication by any route, position, wound care, and other measures to relive pain and suffering. May use oxygen, suction and manual treatment of airway obstruction as needed for comfort.      06/09/21 1414           Code Status History     Date Active Date Inactive Code Status Order ID Comments User Context   06/09/2021 1229 06/09/2021 1414 Full Code [id number]  CoxBriant Cedar, DO ED   05/21/2021 0944 05/26/2021 0224 DNR [id number]  Collier Bullock, MD ED   05/21/2021 [phone] 05/21/2021 0944 DNR [id number]  Collier Bullock, MD ED   05/21/2021 0542 05/21/2021 0938 Full Code [id number]  Athena Masse, MD ED   08/04/2020 0535 08/11/2020 1917 Full Code [id number]  Athena Masse, MD ED   06/22/2020 1908 07/02/2020 1918 Full Code [id number]  Gwynne Edinger, MD ED   05/04/2016 1336 05/09/2016 1740 Full Code [id number]  Lytle Butte, MD Inpatient   04/28/2016 0157 05/02/2016 1656 Full Code [id number]  Harrie Foreman, MD ED   07/17/2015 1111 07/20/2015 1432 Full Code [id number]  Bettey Costa, MD Inpatient   09/10/2012 1956 09/23/2012 1532 Full Code [id number]  Lorraine Lax, RN Inpatient   09/09/2012 0817 09/10/2012 1956 Full Code [id number]  Rexene Alberts, MD Inpatient   09/05/2012 1324 09/09/2012 0817 Full Code [id number]  Edwyna Perfect, RN Inpatient      Advance Directive Documentation    Flowsheet Row Most Recent Value  Type of Advance Directive Living will  Pre-existing out of facility DNR order (yellow form or pink MOST form) --  "MOST" Form in Place? --      Family Communication: Spoke with  husband at the bedside Disposition Plan: Status is: Inpatient  Dispo: The patient is from: Home              Anticipated d/c is to: Rehab on 06/20/2021              Patient currently doing better than yesterday and stable to go out to rehab once COVID isolation ends   Difficult to place patient.  No.  Time spent: 27 minutes  [place]

## 2021-06-18 NOTE — Progress Notes (Signed)
Patient's husband requesting to have patient assisted back to bed. Patient has been OOB to chair for less than 30 min. Patient encouraged to stay up in chair for at least another 30 min.

## 2021-06-19 NOTE — TOC Progression Note (Signed)
Transition of Care [facility]) - Progression Note    Patient Details  Name: [name] MRN: [id number] Date of Birth: 31-Jan-1934  Transition of [facility] CM/SW Contact  Zigmund Daniel Dorian Pod, RN Phone Number: 06/19/2021, 1:27 PM  Clinical Narrative:    [facility] [name]) today with update. Dr. Leslye Peer has confirmed pt's isolation has been discontinued.   TOC will awaiting call back for admission status to [facility] on this pt.   Expected Discharge Plan: [place] Barriers to Discharge: Continued Medical Work up  Expected Discharge Plan and Services Expected Discharge Plan: [place]   Discharge Planning Services: CM Consult Post Acute Care Choice: [place] Living arrangements for the past 2 months: Single Family Home                         Representative spoke with at DME Agency: Patient being discharged to SNF         Representative spoke with at [place]: Patient being discharged to SNF   Social Determinants of Health (SDOH) Interventions    Readmission Risk Interventions Readmission Risk Prevention Plan 08/06/2020  Transportation Screening Complete  PCP or Specialist Appt within 3-5 Days Complete  HRI or [place] Complete  Social Work Consult for [place] Planning/Counseling Complete  Palliative Care Screening Not Applicable  Medication Review Press photographer) Complete  Some recent data might be hidden

## 2021-06-19 NOTE — Progress Notes (Signed)
Patient ID: [name], female   DOB: Apr 29, 1934, 87 y.o.   MRN: [id number] [facility] PROGRESS NOTE  ADDLEY BALLINGER [id number] DOB: 11/02/1933 DOA: 06/09/2021 PCP: Idelle Crouch, MD  HPI/Subjective: Patient feeling okay.  Offers no complaints.  Still feels weak.  Admitted 10 days ago with COVID-19 infection and weakness.  Objective: Vitals:   06/19/21 0543 06/19/21 1100  BP: (!) 149/60 (!) 132/54  Pulse: 75 74  Resp: 16 17  Temp: 97.7 F (36.5 C) 98 F (36.7 C)  SpO2: 97% 98%    Intake/Output Summary (Last 24 hours) at 06/19/2021 1348 Last data filed at 06/18/2021 2000 Gross per 24 hour  Intake 465 ml  Output --  Net 465 ml   Filed Weights   06/09/21 0802  Weight: 86.2 kg    ROS: Review of Systems  Respiratory:  Negative for shortness of breath.   Cardiovascular:  Negative for chest pain.  Gastrointestinal:  Negative for abdominal pain, nausea and vomiting.  Exam: Physical Exam HENT:     Head: Normocephalic.     Mouth/Throat:     Pharynx: No oropharyngeal exudate.  Eyes:     General: Lids are normal.     Conjunctiva/sclera: Conjunctivae normal.     Pupils: Pupils are equal, round, and reactive to light.  Cardiovascular:     Rate and Rhythm: Normal rate and regular rhythm.     Heart sounds: Normal heart sounds, S1 normal and S2 normal.  Pulmonary:     Breath sounds: Normal breath sounds. No decreased breath sounds, wheezing, rhonchi or rales.  Abdominal:     Palpations: Abdomen is soft.     Tenderness: There is no abdominal tenderness.  Musculoskeletal:     Right ankle: Swelling present.     Left ankle: Swelling present.  Skin:    General: Skin is warm.     Comments: Bruising posterior left leg.,  Small ulceration anterior left leg.  Healing ulcer anterior right leg  Neurological:     Mental Status: She is alert and oriented to person, place, and time.      Scheduled Meds:  aspirin  81 mg Oral Q M,W,F   Chlorhexidine Gluconate  Cloth  6 each Topical Q0600   clopidogrel  75 mg Oral Daily   epoetin (EPOGEN/PROCRIT) injection  10,000 Units Intravenous Q M,W,F-HD   famotidine  10 mg Oral Daily   heparin  5,000 Units Subcutaneous Q8H   levothyroxine  150 mcg Oral Q0600   metoprolol tartrate  12.5 mg Oral BID   torsemide  100 mg Oral Once per day on Tue Thu Sat    Assessment/Plan:  COVID-19 pneumonia.  Completed remdesivir.  Completed over 10 days of isolation.  Initial diagnosis was on 06/07/2021 as outpatient.  Discontinued isolation.  Can go out to rehab on 06/20/2021. Acute metabolic encephalopathy 2 days ago.  Mental status improved.  Still holding gabapentin.  Use pain medication sparingly.  MRI did show 2 old strokes in the left occipital and frontal regions.  No new infarcts. Weakness.  Physical therapy recommending rehab.  Did a little bit better yesterday with physical therapy than she has during the hospital course. Acute on chronic hypoxic respiratory failure.  Currently on 3 L. End-stage renal disease on hemodialysis Monday Wednesday Friday.  Will need dialysis prior to disposition to rehab. Small left leg anterior shin ulceration.,  Large amount of bruising left lower extremity posteriorly.  Right lower extremity healing anterior shin ulcer. Peripheral vascular disease  and history of strokes on aspirin and Plavix Neuropathy.  Holding gabapentin Chronic combined CHF managed with hemodialysis.  Last EF 35 to 40%.  On lower dose metoprolol.  Patient also on torsemide.  Dialysis to manage fluid. Chronic pain.  Try Tylenol first and try to limit Norco. Hypothyroidism unspecified.  Continue levothyroxine       Code Status:     Code Status Orders  (From admission, onward)           Start     Ordered   06/09/21 1415  Do not attempt resuscitation (DNR)  Continuous       Question Answer Comment  In the event of cardiac or respiratory ARREST Do not call a "code blue"   In the event of cardiac or  respiratory ARREST Do not perform Intubation, CPR, defibrillation or ACLS   In the event of cardiac or respiratory ARREST Use medication by any route, position, wound care, and other measures to relive pain and suffering. May use oxygen, suction and manual treatment of airway obstruction as needed for comfort.      06/09/21 1414           Code Status History     Date Active Date Inactive Code Status Order ID Comments User Context   06/09/2021 1229 06/09/2021 1414 Full Code [id number]  CoxBriant Cedar, DO ED   05/21/2021 0944 05/26/2021 0224 DNR [id number]  Collier Bullock, MD ED   05/21/2021 [phone] 05/21/2021 0944 DNR [id number]  Collier Bullock, MD ED   05/21/2021 0542 05/21/2021 0938 Full Code [id number]  Athena Masse, MD ED   08/04/2020 0535 08/11/2020 1917 Full Code [id number]  Athena Masse, MD ED   06/22/2020 1908 07/02/2020 1918 Full Code [id number]  Gwynne Edinger, MD ED   05/04/2016 1336 05/09/2016 1740 Full Code [id number]  Lytle Butte, MD Inpatient   04/28/2016 0157 05/02/2016 1656 Full Code [id number]  Harrie Foreman, MD ED   07/17/2015 1111 07/20/2015 1432 Full Code [id number]  Bettey Costa, MD Inpatient   09/10/2012 1956 09/23/2012 1532 Full Code [id number]  Lorraine Lax, RN Inpatient   09/09/2012 0817 09/10/2012 1956 Full Code [id number]  Rexene Alberts, MD Inpatient   09/05/2012 1324 09/09/2012 0817 Full Code [id number]  [place], Peri Jefferson, RN Inpatient      Advance Directive Documentation    Flowsheet Row Most Recent Value  Type of Advance Directive Living will  Pre-existing out of facility DNR order (yellow form or pink MOST form) --  "MOST" Form in Place? --      Family Communication: Spoke with husband on the phone Disposition Plan: Status is: Inpatient  Dispo: The patient is from: Home              Anticipated d/c is to: Rehab on 06/20/2021              Patient currently doing better than she had been 2 days ago.  Stable to go out to rehab tomorrow   Difficult to place  patient.  No.  Consultants: Nephrology  Time spent: 26 minutes  [place]

## 2021-06-19 NOTE — Progress Notes (Signed)
Will plan for HD tomorrow prior to disposition to rehab.

## 2021-06-19 NOTE — Plan of Care (Signed)
Report received at bedside. A/O x 4 with intermittent forgetfulness. Even and unlabored breathing. 4Ps and plan of care discussed. Repositioned with BLE elevated on a pillow. Skin intact with some ecchymosis. Call light within reach. Continue poc/ hourly rounding.  Problem: Education: Goal: Knowledge of General Education information will improve Description: Including pain rating scale, medication(s)/side effects and non-pharmacologic comfort measures Outcome: Progressing   Problem: Health Behavior/Discharge Planning: Goal: Ability to manage health-related needs will improve Outcome: Progressing   Problem: Clinical Measurements: Goal: Ability to maintain clinical measurements within normal limits will improve Outcome: Progressing Goal: Will remain free from infection Outcome: Progressing Goal: Diagnostic test results will improve Outcome: Progressing Goal: Respiratory complications will improve Outcome: Progressing Goal: Cardiovascular complication will be avoided Outcome: Progressing   Problem: Activity: Goal: Risk for activity intolerance will decrease Outcome: Progressing   Problem: Nutrition: Goal: Adequate nutrition will be maintained Outcome: Progressing   Problem: Coping: Goal: Level of anxiety will decrease Outcome: Progressing   Problem: Elimination: Goal: Will not experience complications related to bowel motility Outcome: Progressing Goal: Will not experience complications related to urinary retention Outcome: Progressing   Problem: Pain Managment: Goal: General experience of comfort will improve Outcome: Progressing   Problem: Safety: Goal: Ability to remain free from injury will improve Outcome: Progressing   Problem: Skin Integrity: Goal: Risk for impaired skin integrity will decrease Outcome: Progressing   Problem: Education: Goal: Knowledge of risk factors and measures for prevention of condition will improve Outcome: Progressing   Problem:  Coping: Goal: Psychosocial and spiritual needs will be supported Outcome: Progressing   Problem: Respiratory: Goal: Will maintain a patent airway Outcome: Progressing Goal: Complications related to the disease process, condition or treatment will be avoided or minimized Outcome: Progressing   Problem: Education: Goal: Ability to describe self-care measures that may prevent or decrease complications (Diabetes Survival Skills Education) will improve Outcome: Progressing Goal: Individualized Educational Video(s) Outcome: Progressing   Problem: Coping: Goal: Ability to adjust to condition or change in health will improve Outcome: Progressing   Problem: Fluid Volume: Goal: Ability to maintain a balanced intake and output will improve Outcome: Progressing   Problem: Health Behavior/Discharge Planning: Goal: Ability to identify and utilize available resources and services will improve Outcome: Progressing Goal: Ability to manage health-related needs will improve Outcome: Progressing   Problem: Metabolic: Goal: Ability to maintain appropriate glucose levels will improve Outcome: Progressing   Problem: Nutritional: Goal: Maintenance of adequate nutrition will improve Outcome: Progressing Goal: Progress toward achieving an optimal weight will improve Outcome: Progressing   Problem: Skin Integrity: Goal: Risk for impaired skin integrity will decrease Outcome: Progressing   Problem: Tissue Perfusion: Goal: Adequacy of tissue perfusion will improve Outcome: Progressing

## 2021-06-20 MED ORDER — METOPROLOL TARTRATE 25 MG PO TABS: 12.5000 mg | ORAL_TABLET | Freq: Two times a day (BID) | ORAL | Status: DC

## 2021-06-20 MED ORDER — ACETAMINOPHEN 325 MG PO TABS: 650.0000 mg | ORAL_TABLET | Freq: Four times a day (QID) | ORAL | Status: AC | PRN

## 2021-06-20 MED ORDER — BENZONATATE 100 MG PO CAPS: 100.0000 mg | ORAL_CAPSULE | ORAL | 0 refills | Status: DC | PRN

## 2021-06-20 MED ORDER — ALPRAZOLAM 0.5 MG PO TABS: 0.5000 mg | ORAL_TABLET | Freq: Two times a day (BID) | ORAL | 0 refills | Status: AC | PRN

## 2021-06-20 MED ORDER — FAMOTIDINE 10 MG PO TABS: 10.0000 mg | ORAL_TABLET | Freq: Every day | ORAL | Status: AC

## 2021-06-20 MED ORDER — HYDROCODONE-ACETAMINOPHEN 5-325 MG PO TABS: 1.0000 | ORAL_TABLET | Freq: Four times a day (QID) | ORAL | 0 refills | Status: AC | PRN

## 2021-06-20 NOTE — Care Management Important Message (Signed)
Important Message  Patient Details  Name: [name] MRN: [id number] Date of Birth: March 03, 1934   Medicare Important Message Given:  Yes  Patient out of room at time of visit.  Copy of Medicare IM left on bedside tray for reference.     [name] 06/20/2021, 12:14 PM

## 2021-06-20 NOTE — Discharge Summary (Signed)
[facility] NAME: [name]    MR#:  [id number]  DATE OF BIRTH:  [birth year]  DATE OF ADMISSION:  06/09/2021 ADMITTING PHYSICIAN: Wyvonnia Dusky, MD  DATE OF DISCHARGE: 06/20/2021  PRIMARY CARE PHYSICIAN: Idelle Crouch, MD    ADMISSION DIAGNOSIS:  Generalized weakness [R53.1] ESRD on hemodialysis ([place]) [N18.6, Z99.2] COVID-19 virus infection [U07.1]  DISCHARGE DIAGNOSIS:  Active Problems:   Hyperlipidemia   Hypertension   Breast cancer ([place])   GERD (gastroesophageal reflux disease)   Coronary artery disease   S/P CABG x 4   Weakness   Other chronic pain   CAD (coronary artery disease)   Type 2 diabetes with nephropathy (HCC)   Essential hypertension   CKD (chronic kidney disease) stage 5, GFR less than 15 ml/min (HCC)   Ambulatory dysfunction   Anemia of chronic kidney failure, stage 5 ([place])   Bacteremia due to Staphylococcus   ESRD (end stage renal disease) on dialysis ([place])   Pneumonia due to COVID-19 virus   Hypothyroid   Generalized weakness   Acute on chronic respiratory failure with hypoxia (HCC)   Neuropathy   Acute metabolic encephalopathy   PVD (peripheral vascular disease) (HCC)   Chronic combined systolic and diastolic congestive heart failure ([place])   SECONDARY DIAGNOSIS:   Past Medical History:  Diagnosis Date   Arthritis    Bilateral Macular degeneration    Breast cancer ([place]) 2012   right breast   Breast mass, right    CAD S/P CABG x 4    a. 09/2012 CABG x 4: LIMA to LAD, SVG to Diag, SVG to OM1, SVG to PDA, EVH from bilateral thighs   Cancer ([place])    breast cancer, right side [id number]   Complication of anesthesia    Coronary artery disease 08/22/2012   sees Dr Rockey Situ   Diabetes [facility])    Diastolic dysfunction    a. 06/2020 Echo: EF 60-65%; b. 08/2020 Echo: EF 60-65%, no rwma, Gr1 DD. Nl RV size/fxn.   DOE (dyspnea on exertion)    ESRD (end stage renal disease) ([place])    Gastritis     hx of   GERD (gastroesophageal reflux disease)    Headache(784.0)    migraines   History of breast cancer    39 treatments of radiation. Negative chemo.   History of seasonal allergies    Hyperlipemia    Hypertension    sees Dr. Fulton Reek   Hypothyroidism    Neuropathy    Personal history of radiation therapy    Pneumonia    hx of   PONV (postoperative nausea and vomiting)    Stroke [facility])    2012   Vertigo     HOSPITAL COURSE:   COVID-19 pneumonia.  Patient completed remdesivir.  Completed over 10 days of isolation.  Her initial diagnosis was on 06/07/2021 as outpatient.  Can go out to rehab on 06/20/2021. Acute metabolic encephalopathy 3 days ago.  Mental status improved.  Holding gabapentin and using pain medication sparingly.  MRI did show 2 old strokes in the left occipital and left frontal areas.  No new infarcts. Weakness.  Physical therapy recommending rehab. Acute on chronic hypoxic respiratory failure.  Patient usually wears 2 to 3 L of oxygen at home.  Has been on 3 L here. End-stage renal disease on hemodialysis Monday Wednesday Friday.  Patient receiving dialysis today prior to disposition. Large bruising posterior left leg.  Small left leg  anterior skin ulceration.  Right lower extremity healing anterior skin ulcer. Peripheral vascular disease and history of strokes on aspirin and Plavix Neuropathy.  Holding gabapentin Chronic combined CHF managed with hemodialysis.  Last EF 35 to 40%.  On lower dose metoprolol.  Patient also on torsemide.  Dialysis to help manage fluid. Chronic pain.  Try Tylenol first.  Try to limit Norco Hypothyroidism unspecified on levothyroxine Anemia of chronic disease on Epogen with dialysis.  Last hemoglobin 10.1 Essential hypertension on metoprolol and torsemide.  Patient did have relative hypotension during the hospital course and her metoprolol dose needed to be decreased also hydralazine was discontinued.  Blood pressure starting to  trend a little higher.  Can consider restarting lower dose hydralazine or titrating up metoprolol if blood pressure continues to trend up.  DISCHARGE CONDITIONS:   Satisfactory  CONSULTS OBTAINED:    Nephrology  DRUG ALLERGIES:   Allergies  Allergen Reactions   Clonidine Other (See Comments) and Shortness Of Breath    Other reaction(s): Other (see comments) Other reaction(s): Other (See Comments)   Darvocet [Propoxyphene N-Acetaminophen] Anaphylaxis   Hydralazine     Other reaction(s): Other (See Comments) glomerulonephritis   Hydralazine Hcl Other (See Comments)    glomerulonephritis   Esomeprazole     Other reaction(s): Unknown   Guaifenesin     Other reaction(s): Unknown Other reaction(s): Unknown Other reaction(s): Unknown Other reaction(s): Unknown   Phenylephrine-Guaifenesin     Other reaction(s): Unknown   Rabeprazole     Other reaction(s): Unknown   Telithromycin     Other reaction(s): Unknown Other reaction(s): Unknown Other reaction(s): Unknown   Fluocinolone Other (See Comments)    Feels crazy Other reaction(s): Other (see comments) Other reaction(s): Other (See Comments) Feels crazy Feels crazy   Iodinated Diagnostic Agents Nausea And Vomiting and Other (See Comments)    Burning Other reaction(s): Other (see comments) Other reaction(s): Other (See Comments) Burning Nausea & vomiting   Levaquin [Levofloxacin] Nausea Only   Statins Other (See Comments)    Myalgia Other reaction(s): Other (See Comments), Unknown, Unknown Myalgia    DISCHARGE MEDICATIONS:   Allergies as of 06/20/2021       Reactions   Clonidine Other (See Comments), Shortness Of Breath   Other reaction(s): Other (see comments) Other reaction(s): Other (See Comments)   Darvocet [propoxyphene N-acetaminophen] Anaphylaxis   Hydralazine    Other reaction(s): Other (See Comments) glomerulonephritis   Hydralazine Hcl Other (See Comments)   glomerulonephritis   Esomeprazole     Other reaction(s): Unknown   Guaifenesin    Other reaction(s): Unknown Other reaction(s): Unknown Other reaction(s): Unknown Other reaction(s): Unknown   Phenylephrine-guaifenesin    Other reaction(s): Unknown   Rabeprazole    Other reaction(s): Unknown   Telithromycin    Other reaction(s): Unknown Other reaction(s): Unknown Other reaction(s): Unknown   Fluocinolone Other (See Comments)   Feels crazy Other reaction(s): Other (see comments) Other reaction(s): Other (See Comments) Feels crazy Feels crazy   Iodinated Diagnostic Agents Nausea And Vomiting, Other (See Comments)   Burning Other reaction(s): Other (see comments) Other reaction(s): Other (See Comments) Burning Nausea & vomiting   Levaquin [levofloxacin] Nausea Only   Statins Other (See Comments)   Myalgia Other reaction(s): Other (See Comments), Unknown, Unknown Myalgia        Medication List     STOP taking these medications    azithromycin 250 MG tablet Commonly known as: ZITHROMAX   doxycycline 100 MG capsule Commonly known as: VIBRAMYCIN   gabapentin  100 MG capsule Commonly known as: NEURONTIN   hydrALAZINE 25 MG tablet Commonly known as: APRESOLINE   Lagevrio 200 MG Caps capsule Generic drug: molnupiravir EUA   neomycin-bacitracin-polymyxin ointment Commonly known as: NEOSPORIN       TAKE these medications    acetaminophen 325 MG tablet Commonly known as: TYLENOL Take 2 tablets (650 mg total) by mouth every 6 (six) hours as needed for headache.   ALPRAZolam 0.5 MG tablet Commonly known as: XANAX Take 0.5 mg by mouth 2 (two) times daily as needed for anxiety.   aspirin 81 MG chewable tablet Chew 81 mg by mouth every Monday, Wednesday, and Friday.   benzonatate 100 MG capsule Commonly known as: TESSALON Take 1 capsule (100 mg total) by mouth every 4 (four) hours as needed for cough. What changed:  medication strength how much to take when to take this reasons to take this    calcium-vitamin D 500-200 MG-UNIT tablet Commonly known as: OSCAL WITH D Take 1 tablet by mouth daily.   clopidogrel 75 MG tablet Commonly known as: PLAVIX Take 75 mg by mouth daily.   Cyanocobalamin 1000 MCG Subl Take 1,000 mcg by mouth daily.   famotidine 10 MG tablet Commonly known as: PEPCID Take 1 tablet (10 mg total) by mouth daily. What changed:  medication strength how much to take when to take this   HYDROcodone-acetaminophen 5-325 MG tablet Commonly known as: NORCO/VICODIN Take 1 tablet by mouth every 6 (six) hours as needed for moderate pain.   levothyroxine 150 MCG tablet Commonly known as: SYNTHROID Take by mouth.   metoprolol tartrate 25 MG tablet Commonly known as: LOPRESSOR Take 0.5 tablets (12.5 mg total) by mouth 2 (two) times daily. What changed:  medication strength how much to take when to take this   ondansetron 4 MG disintegrating tablet Commonly known as: Zofran ODT Take 1 tablet (4 mg total) by mouth every 8 (eight) hours as needed for nausea or vomiting.   OXYGEN Inhale 2 L into the lungs daily.   torsemide 100 MG tablet Commonly known as: DEMADEX Take 1 tablet (100 mg total) by mouth 3 (three) times a week. On Non dialysis days         DISCHARGE INSTRUCTIONS:   Follow-up team at rehab 1 day Follow-up dialysis Monday Wednesday Friday  If you experience worsening of your admission symptoms, develop shortness of breath, life threatening emergency, suicidal or homicidal thoughts you must seek medical attention immediately by calling 911 or calling your MD immediately  if symptoms less severe.  You Must read complete instructions/literature along with all the possible adverse reactions/side effects for all the Medicines you take and that have been prescribed to you. Take any new Medicines after you have completely understood and accept all the possible adverse reactions/side effects.   Please note  You were cared for by a  hospitalist during your hospital stay. If you have any questions about your discharge medications or the care you received while you were in the hospital after you are discharged, you can call the unit and asked to speak with the hospitalist on call if the hospitalist that took care of you is not available. Once you are discharged, your primary care physician will handle any further medical issues. Please note that NO REFILLS for any discharge medications will be authorized once you are discharged, as it is imperative that you return to your primary care physician (or establish a relationship with a primary care physician if you  do not have one) for your aftercare needs so that they can reassess your need for medications and monitor your lab values.    Today   CHIEF COMPLAINT:   Chief Complaint  Patient presents with   Weakness    HISTORY OF PRESENT ILLNESS:  Kasumi Ditullio  is a 87 y.o. female came in with weakness and difficulty walking   VITAL SIGNS:  Blood pressure (!) 162/54, pulse 76, temperature 97.7 F (36.5 C), resp. rate 17, height 5\' 4"  (1.626 m), weight 86.2 kg, SpO2 98 %.   PHYSICAL EXAMINATION:  GENERAL:  87 y.o.-year-old patient lying in the bed with no acute distress.  EYES: Pupils equal, round, reactive to light and accommodation. No scleral icterus.  HEENT: Head atraumatic, normocephalic. Oropharynx and nasopharynx clear.  LUNGS: Decreased breath sounds bilateral bases, no wheezing, rales,rhonchi or crepitation. No use of accessory muscles of respiration.  CARDIOVASCULAR: S1, S2 normal. No murmurs, rubs, or gallops.  ABDOMEN: Soft, non-tender, non-distended.  EXTREMITIES: Trace pedal edema.  NEUROLOGIC: Cranial nerves II through XII are intact. Muscle strength 5/5 in all extremities. Sensation intact. Gait not checked.  PSYCHIATRIC: The patient is alert and oriented x 3.  SKIN: Left leg posteriorly numerous bruises.  Left anterior shin small ulceration which is  healing.  Right anterior shin healing prior ulceration.  DATA REVIEW:   CBC Recent Labs  Lab 06/17/21 0523  WBC 7.8  HGB 10.1*  HCT 32.3*  PLT 210    Chemistries  Recent Labs  Lab 06/14/21 0507 06/15/21 0349 06/17/21 0523  NA 131*   < > 130*  K 4.3   < > 4.0  CL 94*   < > 94*  CO2 27   < > 29  GLUCOSE 82   < > 82  BUN 55*   < > 53*  CREATININE 6.22*   < > 6.11*  CALCIUM 7.2*   < > 7.4*  AST 7*  --   --   ALT 5  --   --   ALKPHOS 90  --   --   BILITOT 0.7  --   --    < > = values in this interval not displayed.    Microbiology Results  Results for orders placed or performed during the hospital encounter of 06/09/21  SARS CORONAVIRUS 2 (TAT 6-24 HRS) Nasopharyngeal Nasopharyngeal Swab     Status: Abnormal   Collection Time: 06/09/21 12:24 PM   Specimen: Nasopharyngeal Swab  Result Value Ref Range Status   SARS Coronavirus 2 POSITIVE (A) NEGATIVE Final    Comment: (NOTE) SARS-CoV-2 target nucleic acids are DETECTED.  The SARS-CoV-2 RNA is generally detectable in upper and lower respiratory specimens during the acute phase of infection. Positive results are indicative of the presence of SARS-CoV-2 RNA. Clinical correlation with patient history and other diagnostic information is  necessary to determine patient infection status. Positive results do not rule out bacterial infection or co-infection with other viruses.  The expected result is Negative.  Fact Sheet for Patients: SugarRoll.be  Fact Sheet for Healthcare Providers: https://www.woods-mathews.com/  This test is not yet approved or cleared by the [place] FDA and  has been authorized for detection and/or diagnosis of SARS-CoV-2 by FDA under an Emergency Use Authorization (EUA). This EUA will remain  in effect (meaning this test can be used) for the duration of the COVID-19 declaration under Section 564(b)(1) of the Act, 21 U. S.C. section 360bbb-3(b)(1),  unless the authorization is terminated or revoked sooner.  Performed at [facility], [place] [address], [place], [place] [address]       Management plans discussed with the patient, family and they are in agreement.  CODE STATUS:     Code Status Orders  (From admission, onward)           Start     Ordered   06/09/21 1415  Do not attempt resuscitation (DNR)  Continuous       Question Answer Comment  In the event of cardiac or respiratory ARREST Do not call a "code blue"   In the event of cardiac or respiratory ARREST Do not perform Intubation, CPR, defibrillation or ACLS   In the event of cardiac or respiratory ARREST Use medication by any route, position, wound care, and other measures to relive pain and suffering. May use oxygen, suction and manual treatment of airway obstruction as needed for comfort.      06/09/21 1414           Code Status History     Date Active Date Inactive Code Status Order ID Comments User Context   06/09/2021 1229 06/09/2021 1414 Full Code [id number]  CoxBriant Cedar, DO ED   05/21/2021 0944 05/26/2021 0224 DNR [id number]  Collier Bullock, MD ED   05/21/2021 [phone] 05/21/2021 0944 DNR [id number]  Collier Bullock, MD ED   05/21/2021 0542 05/21/2021 0938 Full Code [id number]  Athena Masse, MD ED   08/04/2020 0535 08/11/2020 1917 Full Code [id number]  Athena Masse, MD ED   06/22/2020 1908 07/02/2020 1918 Full Code [id number]  Gwynne Edinger, MD ED   05/04/2016 1336 05/09/2016 1740 Full Code [id number]  Hower, Aaron Mose, MD Inpatient   04/28/2016 0157 05/02/2016 1656 Full Code [id number]  Harrie Foreman, MD ED   07/17/2015 1111 07/20/2015 1432 Full Code [id number]  Bettey Costa, MD Inpatient   09/10/2012 1956 09/23/2012 1532 Full Code [id number]  Lorraine Lax, RN Inpatient   09/09/2012 0817 09/10/2012 1956 Full Code [id number]  Rexene Alberts, MD Inpatient   09/05/2012 1324 09/09/2012 0817 Full Code [id number]  Edwyna Perfect, RN Inpatient      Advance  Directive Documentation    Flowsheet Row Most Recent Value  Type of Advance Directive Living will  Pre-existing out of facility DNR order (yellow form or pink MOST form) --  "MOST" Form in Place? --       TOTAL TIME TAKING CARE OF THIS PATIENT: 34 minutes.    Loletha Grayer M.D on 06/20/2021 at 2:09 PM  [facility]  CC: Primary care physician; Idelle Crouch, MD

## 2021-06-20 NOTE — TOC Progression Note (Signed)
Transition of Care [facility]) - Progression Note    Patient Details  Name: NGUYEN BUTLER MRN: [id number] Date of Birth: [birth year]  [facility]) CM/SW Contact  Shelbie Hutching, RN Phone Number: 06/20/2021, 10:44 AM  Clinical Narrative:    Patient is medically cleared for discharge to [facility] today after dialysis.  RNCM just started insurance authorization through [facility].    Expected Discharge Plan: [place] Barriers to Discharge: Continued Medical Work up  Expected Discharge Plan and Services Expected Discharge Plan: [place]   Discharge Planning Services: CM Consult Post Acute Care Choice: [place] Living arrangements for the past 2 months: Single Family Home                         Representative spoke with at DME Agency: Patient being discharged to SNF         Representative spoke with at [place]: Patient being discharged to SNF   Social Determinants of Health (SDOH) Interventions    Readmission Risk Interventions Readmission Risk Prevention Plan 08/06/2020  Transportation Screening Complete  PCP or Specialist Appt within 3-5 Days Complete  HRI or [place] Complete  Social Work Consult for [place] Planning/Counseling Complete  Palliative Care Screening Not Applicable  Medication Review Press photographer) Complete  Some recent data might be hidden

## 2021-06-20 NOTE — Progress Notes (Signed)
[facility]  ROUNDING NOTE   Subjective:   Patient seen and evaluated during dialysis   HEMODIALYSIS FLOWSHEET:  Blood Flow Rate (mL/min): 275 mL/min Arterial Pressure (mmHg): -90 mmHg Venous Pressure (mmHg): 250 mmHg Transmembrane Pressure (mmHg): 60 mmHg Ultrafiltration Rate (mL/min): 270 mL/min Dialysate Flow Rate (mL/min): 500 ml/min Conductivity: Machine : 13.8 Conductivity: Machine : 13.8 Dialysis Fluid Bolus: Normal Saline Bolus Amount (mL): 200 mL  Tolerating well   Objective:  Vital signs in last 24 hours:  Temp:  [97.5 F (36.4 C)-98.3 F (36.8 C)] 97.7 F (36.5 C) (09/19 0945) Pulse Rate:  [67-89] 76 (09/19 1200) Resp:  [14-19] 17 (09/19 1200) BP: (113-162)/(49-92) 162/54 (09/19 1200) SpO2:  [98 %-100 %] 98 % (09/19 0945)  Weight change:  Filed Weights   06/09/21 0802  Weight: 86.2 kg    Intake/Output: I/O last 3 completed shifts: In: 225 [P.O.:225] Out: -    Intake/Output this shift:  No intake/output data recorded.  Physical Exam: General: NAD, resting in bed  Head: Normocephalic, atraumatic. Moist oral mucosal membranes  Eyes: Anicteric  Lungs:  Clear to auscultation, normal effort, [place] O2  Heart: Regular rate and rhythm  Abdomen:  Soft, nontender   Extremities:  + peripheral edema.Bilateral wrappings.   Neurologic: Nonfocal, moving all four extremities  Skin: No lesions  Access: Lt AVF    Basic Metabolic Panel: Recent Labs  Lab 06/14/21 0507 06/15/21 0349 06/17/21 0523  NA 131* 130* 130*  K 4.3 4.4 4.0  CL 94* 94* 94*  CO2 27 25 29   GLUCOSE 82 88 82  BUN 55* 67* 53*  CREATININE 6.22* 6.91* 6.11*  CALCIUM 7.2* 7.1* 7.4*     Liver Function Tests: Recent Labs  Lab 06/14/21 0507  AST 7*  ALT 5  ALKPHOS 90  BILITOT 0.7  PROT 7.0  ALBUMIN 2.2*    No results for input(s): LIPASE, AMYLASE in the last 168 hours.  No results for input(s): AMMONIA in the last 168 hours.  CBC: Recent Labs  Lab  06/14/21 0507 06/15/21 0349 06/17/21 0523  WBC 7.0 7.7 7.8  HGB 9.7* 9.9* 10.1*  HCT 32.1* 32.2* 32.3*  MCV 98.5 97.0 100.0  PLT 213 240 210     Cardiac Enzymes: No results for input(s): CKTOTAL, CKMB, CKMBINDEX, TROPONINI in the last 168 hours.  BNP: Invalid input(s): POCBNP  CBG: No results for input(s): GLUCAP in the last 168 hours.   Microbiology: Results for orders placed or performed during the hospital encounter of 06/09/21  SARS CORONAVIRUS 2 (TAT 6-24 HRS) Nasopharyngeal Nasopharyngeal Swab     Status: Abnormal   Collection Time: 06/09/21 12:24 PM   Specimen: Nasopharyngeal Swab  Result Value Ref Range Status   SARS Coronavirus 2 POSITIVE (A) NEGATIVE Final    Comment: (NOTE) SARS-CoV-2 target nucleic acids are DETECTED.  The SARS-CoV-2 RNA is generally detectable in upper and lower respiratory specimens during the acute phase of infection. Positive results are indicative of the presence of SARS-CoV-2 RNA. Clinical correlation with patient history and other diagnostic information is  necessary to determine patient infection status. Positive results do not rule out bacterial infection or co-infection with other viruses.  The expected result is Negative.  Fact Sheet for Patients: SugarRoll.be  Fact Sheet for Healthcare Providers: https://www.woods-mathews.com/  This test is not yet approved or cleared by the [place] FDA and  has been authorized for detection and/or diagnosis of SARS-CoV-2 by FDA under an Emergency Use Authorization (EUA). This EUA will  remain  in effect (meaning this test can be used) for the duration of the COVID-19 declaration under Section 564(b)(1) of the Act, 21 U. S.C. section 360bbb-3(b)(1), unless the authorization is terminated or revoked sooner.   Performed at [facility], [place] [address] [place], [place] [address]     Coagulation Studies: No results for input(s):  LABPROT, INR in the last 72 hours.  Urinalysis: No results for input(s): COLORURINE, LABSPEC, PHURINE, GLUCOSEU, HGBUR, BILIRUBINUR, KETONESUR, PROTEINUR, UROBILINOGEN, NITRITE, LEUKOCYTESUR in the last 72 hours.  Invalid input(s): APPERANCEUR    Imaging: No results found.   Medications:      aspirin  81 mg Oral Q M,W,F   Chlorhexidine Gluconate Cloth  6 each Topical Q0600   clopidogrel  75 mg Oral Daily   epoetin (EPOGEN/PROCRIT) injection  10,000 Units Intravenous Q M,W,F-HD   famotidine  10 mg Oral Daily   heparin  5,000 Units Subcutaneous Q8H   levothyroxine  150 mcg Oral Q0600   metoprolol tartrate  12.5 mg Oral BID   torsemide  100 mg Oral Once per day on Tue Thu Sat   acetaminophen, ALPRAZolam, benzonatate, HYDROcodone-acetaminophen  Assessment/ Plan:  [name] is a 87 y.o. 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 Tolerated well. Discharge plan includes rehab in [place].   Hypertension: 145/61  - current regimen of metoprolol, hydralazine and torsemide.    Anemia of chronic kidney disease: hemoglobin 10.1. Hold ESA for now  Secondary Hyperparathyroidism: not currently on binders.     LOS: [place] 9/19/20221:24 PM

## 2021-06-20 NOTE — Progress Notes (Signed)
Pt w/LUA AVF tx x 3 hrs. cannulated w/difficulty. Elevated VP, reduced BFR . Targeted UF reduced and met. HD meds given per order, Pt. returned to room anticipating discharge to rehab facility.

## 2021-06-20 NOTE — Progress Notes (Signed)
[name] to be D/C'd  to [facility] per MD order.  Discussed with the patient and all questions fully answered.  VSS, Skin clean, dry with scattered ecchymosis and BLE Shin ulcers present on admission. Patient on 3L O2 [place] baseline.   IV catheter discontinued intact. Site without signs and symptoms of complications. Dressing and pressure applied.  An After Visit Summary was printed and placed in discharge packet for facility.  Patient instructed to return to ED, call 911, or call MD for any changes in condition.   Patient to be escorted via stretcher, and D/C to [facility] via hospital transport.

## 2021-06-20 NOTE — Progress Notes (Signed)
Report called to [facility] , all questions fully answered. Informed the receiving nurse that she is on the transport list and will arrive shortly.

## 2021-06-20 NOTE — Progress Notes (Signed)
PT Cancellation Note  Patient Details Name: MAKINZY CLEERE MRN: [id number] DOB: 1934-01-11   Cancelled Treatment:    Reason Eval/Treat Not Completed: Other (comment).  Pt currently off floor at dialysis.  Will re-attempt PT treatment session at a later date/time.  Leitha Bleak, PT 06/20/21, 10:45 AM

## 2021-06-20 NOTE — Progress Notes (Signed)
OT Cancellation Note  Patient Details Name: CHANTEA SURACE MRN: [id number] DOB: [birth year]   Cancelled Treatment:    Reason Eval/Treat Not Completed: Patient at procedure or test/ unavailable. Pt receiving hemodialysis at this time. Will attempt to offer OT services at a later time/date, as pt is available and medically appropriate.  Josiah Lobo, PhD, MS, OTR/L 06/20/21, 10:41 AM

## 2021-06-20 NOTE — TOC Transition Note (Signed)
Transition of Care [facility]) - CM/SW Discharge Note   Patient Details  Name: [name] MRN: [id number] Date of Birth: [birth year]  Transition of [facility]) CM/SW Contact:  Shelbie Hutching, RN Phone Number: 06/20/2021, 6:03 PM   Clinical Narrative:    Patient discharging to [facility] today.  [facility] has been arranged to transport patient.  Patient's husband aware of discharge.    Final next level of care: Skilled Nursing Facility Barriers to Discharge: Barriers Resolved   Patient Goals and CMS Choice Patient states their goals for this hospitalization and ongoing recovery are:: To recover and be able to return home CMS Medicare.gov Compare Post Acute Care list provided to:: Patient Represenative (must comment) Choice offered to / list presented to : Spouse, Patient  Discharge Placement              Patient chooses bed at: [facility] Patient to be transferred to facility by: [facility] Name of family member notified: Bud Ask Patient and family notified of of transfer: 06/20/21  Discharge Plan and Services   Discharge Planning Services: CM Consult Post Acute Care Choice: [place]          DME Arranged: N/A DME Agency: NA     Representative spoke with at DME Agency: Patient being discharged to SNF [facility] Arranged: NA [place] Agency: NA     Representative spoke with at [place]: Patient being discharged to SNF  Social Determinants of Health (SDOH) Interventions     Readmission Risk Interventions Readmission Risk Prevention Plan 08/06/2020  Transportation Screening Complete  PCP or Specialist Appt within 3-5 Days Complete  HRI or [place] Complete  Social Work Consult for [place] Planning/Counseling Complete  Palliative Care Screening Not Applicable  Medication Review Press photographer) Complete  Some recent data might be hidden

## 2021-06-21 ENCOUNTER — Ambulatory Visit: Payer: Medicare Other | Admitting: Physician Assistant

## 2021-06-21 ENCOUNTER — Encounter (INDEPENDENT_AMBULATORY_CARE_PROVIDER_SITE_OTHER): Payer: Medicare Other

## 2021-06-28 ENCOUNTER — Ambulatory Visit (INDEPENDENT_AMBULATORY_CARE_PROVIDER_SITE_OTHER): Payer: Medicare Other | Admitting: Nurse Practitioner

## 2021-07-04 NOTE — Progress Notes (Signed)
Cardiology Office Note  Date:  07/05/2021   ID:  [name], DOB [birth year], MRN [id number]  PCP:  Idelle Crouch, MD   Chief Complaint  Patient presents with   Follow-up    F/u from hospitalization. Patient c/o continued swelling in both feet .     HPI:  [name] is an 87 yo woman with  coronary artery disease,  cardiac catheterization at [facility] 08/22/2012 showing severe distal left main, ostial LAD and circumflex disease also with RCA disease  CABG x4, LIMA to LAD, SVG to Diag, SVG to OM1, SVG to PDA, EVH from bilateral thighs postsurgical stroke with right-sided deficits Chronic renal insufficiency, hydronephrosis ESRD on HD 3x a week Anemia Chronic foot swelling, particularly the feet who presents for followup today of her coronary artery disease, renal failure  In hospital 05/2021 Acute pulmonary edema, requiring HD Sphingobacterium bacteremia HD access declotting D/c 05/25/2021  Recent hospital admission June 09, 2021 Weakness, COVID infection, end-stage renal disease dialysis Treated with remdesivir Initial diagnosis September 6 Had acute metabolic encephalopathy Receiving dialysis Monday Wednesday Friday  Ejection fraction on echocardiogram May 24, 2021 was 35 to 40% Ejection fraction down from prior echo November 2021, EF 60 to 65%  Legs more swollen since discharge Lab work reviewed HGB 10.2  Over the past several months, weight down 24 pounds in 2 month  Other past medical history reviewed In the hospital for procedure with Dr. Lucky Cowboy April 2022, Creation of AV fistula  On HD,  mon/wed/fri  cxr 12/2020 Mild pulmonary edema.  Unchanged cardiomegaly.Mild pulmonary edema. Unchanged cardiomegaly.  on oxygen for SOB, especially with exertion  Other past medical history reviewed In hospital 08/11/2020 infected wound to dorsal aspect of right foot  outpatient dialysis since 07/28/20 wound cultures 07/16/2020 show Pseudomonas, Enterococcus  and MSSA.  staph warneri. treated with IV vancomycin and IV Zosyn.    permacath on the right side of her neck that was removed by the vascular surgeon on 08/05/2020.   new tunneled dialysis catheter was inserted via right IJ approach on 08/10/2020.    4-week course of IV vancomycin and 3-week course of IV ceftazidime to be given during dialysis in the outpatient setting. amlodipine and hydralazine were discontinued secondary to hypotension  Reports that she is concerned about a sacral decub ulcer, As she is sitting for such long periods of time for dialysis She is sedentary, relatively immobile   previously seen Dr. Lucky Cowboy, for venous insufficiency   cardiac catheterization report 08/22/2012 showed distal left main 70% disease, ostial LAD of 80%, proximal LAD 70%, distal LAD diffuse 70%, diagonal #150% followed by discrete 95% lesion, ostial circumflex 90% disease, proximal circumflex 70% disease, proximal RCA 50%, distal RCA 60% PDA branch 60%  PMH:   has a past medical history of Arthritis, Bilateral Macular degeneration, Breast cancer ([place]) (2012), Breast mass, right, CAD S/P CABG x 4, Cancer ([place]), Complication of anesthesia, Coronary artery disease (08/22/2012), Diabetes ([place]), Diastolic dysfunction, DOE (dyspnea on exertion), ESRD (end stage renal disease) ([place]), Gastritis, GERD (gastroesophageal reflux disease), Headache(784.0), History of breast cancer, History of seasonal allergies, Hyperlipemia, Hypertension, Hypothyroidism, Neuropathy, Personal history of radiation therapy, Pneumonia, PONV (postoperative nausea and vomiting), Stroke ([place]), and Vertigo.  PSH:    Past Surgical History:  Procedure Laterality Date   A/V FISTULAGRAM Left 01/13/2021   Procedure: A/V FISTULAGRAM;  Surgeon: Algernon Huxley, MD;  Location: [place] CV LAB;  Service: Cardiovascular;  Laterality: Left;   A/V SHUNT INTERVENTION Left  05/23/2021   Procedure: A/V SHUNT INTERVENTION;  Surgeon: Algernon Huxley, MD;   Location: [place] CV LAB;  Service: Cardiovascular;  Laterality: Left;   ABDOMINAL HYSTERECTOMY     APPENDECTOMY     AV FISTULA PLACEMENT Left 11/04/2020   Procedure: ARTERIOVENOUS (AV) FISTULA CREATION (BRACHIOCEPHALIC);  Surgeon: Algernon Huxley, MD;  Location: [facility] ORS;  Service: Vascular;  Laterality: Left;   BACK SURGERY     BREAST BIOPSY Right    2012 positive- [facility] [place]   BREAST LUMPECTOMY Right 2012   F/U radiation    BREAST SURGERY     CARDIAC CATHETERIZATION  2014   CAROTID PTA/STENT INTERVENTION Left 06/14/2020   Procedure: CAROTID PTA/STENT INTERVENTION;  Surgeon: Algernon Huxley, MD;  Location: [place] CV LAB;  Service: Cardiovascular;  Laterality: Left;   CATARACT EXTRACTION W/PHACO Right 08/14/2017   Procedure: CATARACT EXTRACTION PHACO AND INTRAOCULAR LENS PLACEMENT (IOC);  Surgeon: Birder Robson, MD;  Location: [facility] ORS;  Service: Ophthalmology;  Laterality: Right;  [place] 00:43.0 AP% 17.1 CDE 7.37 Fluid Pack lot # [id number]   CATARACT EXTRACTION W/PHACO Left 10/09/2017   Procedure: CATARACT EXTRACTION PHACO AND INTRAOCULAR LENS PLACEMENT (IOC);  Surgeon: Birder Robson, MD;  Location: [facility] ORS;  Service: Ophthalmology;  Laterality: Left;  [place] 00:30.3 AP% 11.0 CDE 3.33 Fluid Pack Lot # [id number]   CORONARY ARTERY BYPASS GRAFT  09/05/2012   Procedure: CORONARY ARTERY BYPASS GRAFTING (CABG);  Surgeon: Rexene Alberts, MD;  Location: [place];  Service: Open Heart Surgery;  Laterality: N/A;  CABG x four, using left internal mammary artery and bilateral greater saphenous vein harvested endoscopically   DIALYSIS/PERMA CATHETER INSERTION Left 06/25/2020   Procedure: DIALYSIS/PERMA CATHETER INSERTION;  Surgeon: Algernon Huxley, MD;  Location: [place] CV LAB;  Service: Cardiovascular;  Laterality: Left;   DIALYSIS/PERMA CATHETER INSERTION N/A 08/10/2020   Procedure: DIALYSIS/PERMA CATHETER INSERTION;  Surgeon: Katha Cabal, MD;  Location: [place] CV LAB;  Service:  Cardiovascular;  Laterality: N/A;   DIALYSIS/PERMA CATHETER INSERTION N/A 05/23/2021   Procedure: DIALYSIS/PERMA CATHETER INSERTION;  Surgeon: Algernon Huxley, MD;  Location: [place] CV LAB;  Service: Cardiovascular;  Laterality: N/A;   DIALYSIS/PERMA CATHETER REMOVAL N/A 08/05/2020   Procedure: DIALYSIS/PERMA CATHETER REMOVAL;  Surgeon: Algernon Huxley, MD;  Location: [place] CV LAB;  Service: Cardiovascular;  Laterality: N/A;   INTRAOPERATIVE TRANSESOPHAGEAL ECHOCARDIOGRAM  09/05/2012   Procedure: INTRAOPERATIVE TRANSESOPHAGEAL ECHOCARDIOGRAM;  Surgeon: Rexene Alberts, MD;  Location: [place];  Service: Open Heart Surgery;  Laterality: N/A;   KNEE SURGERY     Bilateral nerve block   LAPAROSCOPIC NISSEN FUNDOPLICATION     OVARIAN CYST REMOVAL      Current Outpatient Medications  Medication Sig Dispense Refill   acetaminophen (TYLENOL) 325 MG tablet Take 2 tablets (650 mg total) by mouth every 6 (six) hours as needed for headache.     ALPRAZolam (XANAX) 0.5 MG tablet Take 1 tablet (0.5 mg total) by mouth 2 (two) times daily as needed for anxiety. 10 tablet 0   aspirin 81 MG chewable tablet Chew 81 mg by mouth every Monday, Wednesday, and Friday.      calcium-vitamin D (OSCAL WITH D) 500-200 MG-UNIT tablet Take 1 tablet by mouth daily.     clopidogrel (PLAVIX) 75 MG tablet Take 75 mg by mouth daily.     Cyanocobalamin 1000 MCG SUBL Take 1,000 mcg by mouth daily.      famotidine (PEPCID) 10 MG tablet Take 1 tablet (  10 mg total) by mouth daily.     HYDROcodone-acetaminophen (NORCO/VICODIN) 5-325 MG tablet Take 1 tablet by mouth every 6 (six) hours as needed for moderate pain. 10 tablet 0   levothyroxine (SYNTHROID) 150 MCG tablet Take by mouth.     metoprolol tartrate (LOPRESSOR) 25 MG tablet Take 0.5 tablets (12.5 mg total) by mouth 2 (two) times daily.     ondansetron (ZOFRAN ODT) 4 MG disintegrating tablet Take 1 tablet (4 mg total) by mouth every 8 (eight) hours as needed for nausea or  vomiting. 20 tablet 0   OXYGEN Inhale 2 L into the lungs daily.     torsemide (DEMADEX) 100 MG tablet Take 1 tablet (100 mg total) by mouth 3 (three) times a week. On Non dialysis days 30 tablet 0   No current facility-administered medications for this visit.    Allergies:   Clonidine, Darvocet [propoxyphene n-acetaminophen], Hydralazine, Hydralazine hcl, Esomeprazole, Guaifenesin, Phenylephrine-guaifenesin, Rabeprazole, Telithromycin, Fluocinolone, Iodinated diagnostic agents, Levaquin [levofloxacin], and Statins   Social History:  The patient  reports that she quit smoking about 32 years ago. Her smoking use included cigarettes. She has never used smokeless tobacco. She reports that she does not drink alcohol and does not use drugs.   Family History:   family history includes Breast cancer in her cousin; Cancer in her father; Heart disease in her brother and mother; Hyperlipidemia in her brother; Hypertension in her brother.    Review of Systems: Review of Systems  Constitutional: Negative.   HENT: Negative.    Eyes: Negative.   Respiratory: Negative.    Cardiovascular:  Positive for leg swelling.  Gastrointestinal: Negative.   Musculoskeletal:  Positive for joint pain.       Gait instability  Neurological: Negative.   Psychiatric/Behavioral: Negative.    All other systems reviewed and are negative.  PHYSICAL EXAM: VS:  BP 130/72   Pulse 72   Ht 5\' 4"  (1.626 m)   Wt 166 lb (75.3 kg)   SpO2 93% Comment: with 2L of 02  BMI 28.49 kg/m  , BMI Body mass index is 28.49 kg/m. Constitutional:  oriented to person, place, and time. No distress.  HENT:  Head: Grossly normal Eyes:  no discharge. No scleral icterus.  Neck: No JVD, no carotid bruits  Cardiovascular: Regular rate and rhythm, no murmurs appreciated Pulmonary/Chest: Clear to auscultation bilaterally, no wheezes or rails Abdominal: Soft.  no distension.  no tenderness.  Musculoskeletal: Normal range of  motion Neurological:  normal muscle tone. Coordination normal. No atrophy Skin: Skin warm and dry Psychiatric: normal affect, pleasant  Recent Labs: 05/21/2021: B Natriuretic Peptide 1,955.9 06/10/2021: Magnesium 2.0 06/14/2021: ALT 5 06/17/2021: BUN 53; Creatinine, Ser 6.11; Hemoglobin 10.1; Platelets 210; Potassium 4.0; Sodium 130    Lipid Panel Lab Results  Component Value Date   CHOL 154 09/09/2012   HDL 31 (L) 09/09/2012   LDLCALC 92 09/09/2012   TRIG 157 (H) 09/09/2012     Wt Readings from Last 3 Encounters:  07/05/21 166 lb (75.3 kg)  06/09/21 190 lb (86.2 kg)  06/08/21 180 lb (81.6 kg)     ASSESSMENT AND PLAN:  Cardiomyopathy Noted on last echocardiogram in the setting of sepsis Ejection fraction moderately reduced, exact etiology unclear We recommend to change metoprolol to tartrate to metoprolol succinate to start We will see her back in close follow-up for additional medication changes Medication options limited secondary to renal dysfunction/end-stage renal disease and some medication reluctance -Could consider low-dose nitrates, hydralazine Consider  repeat echocardiogram in several months time  Essential hypertension -  Will change to metoprolol succinate  Coronary artery disease involving coronary bypass graft of native heart with angina pectoris ([place]) - Currently with no symptoms of angina. No further workup at this time. Continue current medication regimen.  Pulmonary edema Seen on x-ray, continued leg swelling, shortness of breath on exertion now on oxygen Recommended she add torsemide 100 mg on nondialysis days She does not want to extend her dialysis time  Mixed hyperlipidemia  declined cholesterol medication  S/P CABG x 4  Denies angina, very sedentary  End-stage renal disease on hemodialysis Monday Wednesday Friday, Complaining about leg swelling but hesitant to add any time to her dialysis  Anemia, unspecified type  Chronic anemia with  slow trend upwards, likely contributing to shortness of breath, weakness, leg swelling  Weakness Had nerve blocks to her knees bilaterally  Sedentary, weak legs, high fall risk   Total encounter time more than 25 minutes  Greater than 50% was spent in counseling and coordination of care with the patient     No orders of the defined types were placed in this encounter.    Signed, Esmond Plants, M.D., Ph.D. 07/05/2021  [facility] [place], [place]

## 2021-07-05 ENCOUNTER — Other Ambulatory Visit: Payer: Self-pay

## 2021-07-05 ENCOUNTER — Ambulatory Visit (INDEPENDENT_AMBULATORY_CARE_PROVIDER_SITE_OTHER): Payer: Medicare Other | Admitting: Cardiovascular Disease

## 2021-07-05 ENCOUNTER — Encounter: Payer: Self-pay | Admitting: Cardiovascular Disease

## 2021-07-05 VITALS — BP 130/72 | HR 72 | Ht 64.0 in | Wt 166.0 lb

## 2021-07-05 DIAGNOSIS — I10 Essential (primary) hypertension: Secondary | ICD-10-CM

## 2021-07-05 DIAGNOSIS — N186 End stage renal disease: Secondary | ICD-10-CM

## 2021-07-05 DIAGNOSIS — R0602 Shortness of breath: Secondary | ICD-10-CM | POA: Diagnosis not present

## 2021-07-05 DIAGNOSIS — E785 Hyperlipidemia, unspecified: Secondary | ICD-10-CM

## 2021-07-05 DIAGNOSIS — I25810 Atherosclerosis of coronary artery bypass graft(s) without angina pectoris: Secondary | ICD-10-CM

## 2021-07-05 DIAGNOSIS — E1159 Type 2 diabetes mellitus with other circulatory complications: Secondary | ICD-10-CM

## 2021-07-05 DIAGNOSIS — E119 Type 2 diabetes mellitus without complications: Secondary | ICD-10-CM

## 2021-07-05 DIAGNOSIS — Z992 Dependence on renal dialysis: Secondary | ICD-10-CM

## 2021-07-05 MED ORDER — METOPROLOL SUCCINATE ER 25 MG PO TB24: 25.0000 mg | ORAL_TABLET | Freq: Every day | ORAL | 3 refills | Status: DC

## 2021-07-05 NOTE — Patient Instructions (Addendum)
Medication Instructions:  Please STOP metoprolol tartrate  Please START metoprolol succinate 25 mg daily  If you need a refill on your cardiac medications before your next appointment, please call your pharmacy.   Lab work: No new labs needed  Testing/Procedures: No new testing needed  Follow-Up: At [facility], you and your health needs are our priority.  As part of our continuing mission to provide you with exceptional heart care, we have created designated Provider Care Teams.  These Care Teams include your primary Cardiologist (physician) and Advanced Practice Providers (APPs -  Physician Assistants and Nurse Practitioners) who all work together to provide you with the care you need, when you need it.  You will need a follow up appointment in 1 months  Providers on your designated Care Team:   Murray Hodgkins, NP Christell Faith, PA-C Marrianne Mood, PA-C [place], [place]  COVID-19 Vaccine Information can be found at: [email or web address].uk For questions related to vaccine distribution or appointments, please email vaccine@[email or web address] [email or web address] or call [phone].

## 2021-07-07 ENCOUNTER — Telehealth: Payer: Self-pay | Admitting: Cardiovascular Disease

## 2021-07-07 NOTE — Telephone Encounter (Signed)
STAT incoming triage call received.  Patient called to report increased sob since being switched by Dr. Rockey Situ from metoprolol tartrate to metoprolol succinate on 07/05/21. Patient denies any other new symptoms. Her last dose of metoprolol was taken yesterday. She sts that she will not take any more of the medication. Patient does sts that her BP has been ok when checked 3 times a week before and after dialysis. Advised the patient that I would agree that she should hold the medication. She previously on and tolerated metoprolol tartrate 50 mg bid. She still has some on hand and will resume taking for now. Advised the patient that I will fwd the update to Dr. Rockey Situ for further recommendation. Patient has been advised to call 911 for worsening symptoms or sob.

## 2021-07-07 NOTE — Telephone Encounter (Signed)
Pt c/o medication issue:  1. Name of Medication: Toprol XL  2. How are you currently taking this medication (dosage and times per day)? 25 mg 1 tablet daily  3. Are you having a reaction (difficulty breathing--STAT)? Yes breathing   4. What is your medication issue?

## 2021-07-18 MED ORDER — CARVEDILOL 6.25 MG PO TABS: 6.2500 mg | ORAL_TABLET | Freq: Two times a day (BID) | ORAL | 3 refills | Status: DC

## 2021-07-18 NOTE — Telephone Encounter (Signed)
Able to reach Mrs. Streb, advised Dr. Rockey Situ review her medication list and advised  We are trying different medications to make her heart stronger given cardiomyopathy  If she did not tolerate metoprolol succinate,  Second option would be carvedilol 6.25 twice daily in place of metoprolol tartrate  [place]   Pt agrees to try medication "I'll try for about 3 days and that should be enough to see if I can tolerate it, if not, I'll call and let you know". Script sent into [facility] in [place];  Carvedilol 6.25 mg BID  Pt asked for metoprolol be added to her allergy list as it made her "very short of breath, I felt winded, and couldn't cath my breath".  Allergy list updated.   Mrs. Wever thankful for calling her, all concerns were address and no additional concerns at this time. Agreeable to plan, will call back for anything further.

## 2021-07-18 NOTE — Telephone Encounter (Signed)
Attempted to reach pt, husband Bud reports pt is at dialysis at this time, st to call back around 1 pm of after.   Will attempt to call pt back to advise on medication change per Dr. Rockey Situ  We are trying different medications to make her heart stronger given cardiomyopathy  If she did not tolerate metoprolol succinate,  Second option would be carvedilol 6.25 twice daily in place of metoprolol tartrate  [place]

## 2021-07-18 NOTE — Addendum Note (Signed)
Addended by: Wynema Birch on: 07/18/2021 02:24 PM   Modules accepted: Orders

## 2021-08-01 ENCOUNTER — Telehealth: Payer: Self-pay | Admitting: Cardiovascular Disease

## 2021-08-01 NOTE — Telephone Encounter (Signed)
Please call to discuss Losartan, patient states ever since she has started taking she has had a headache. She asks if she can start her Metoprolol. Please call to discuss.

## 2021-08-01 NOTE — Telephone Encounter (Signed)
Was able to return call to Mrs. [name], she advised that Losartan gives her headaches, advised that Losartan is not currently on her med list. Recently started on Carvedilol 6.25 mg from her metoprolol succinate which she stopped taking d/t SOB.  Pt wants to stop Carvedilol and restart her metoprolol succinate, advised to monitor for SOB as she asked me to place on her allergy list from last phone encounter. Pt verbalized she will monitor herself with medication and discuss further with Dr. Rockey Situ on 11/8 at her upcoming appt.   Will remove carvedilol from her medication list and will advised Dr. Rockey Situ on her decision of med changes. Mrs. [name] thankful for the return call, will call back with anything further and will be at her appt next week to discuss meds.

## 2021-08-08 NOTE — Progress Notes (Signed)
Cardiology Office Note  Date:  08/09/2021   ID:  [name], DOB 06/03/1934, MRN [id number]  PCP:  Sarah Crouch, MD   Chief Complaint  Patient presents with   Follow-up    Follow up and medications verbally reviewed with patient and husband.    HPI:  [name] is an 87 yo woman with  coronary artery disease,  cardiac catheterization at [facility] 08/22/2012 showing severe distal left main, ostial LAD and circumflex disease also with RCA disease  CABG x4, LIMA to LAD, SVG to Diag, SVG to OM1, SVG to PDA, EVH from bilateral thighs postsurgical stroke with right-sided deficits Chronic renal insufficiency, hydronephrosis ESRD on HD 3x a week Anemia Chronic foot swelling, particularly the feet who presents for followup today of her coronary artery disease, renal failure  Last seen on clinic July 05, 2021 Cardiomyopathy noted last echocardiogram in the setting of sepsis Ejection fraction moderately reduced, exact etiology unclear, possibly covid EF 35 to 40% Metoprolol tartrate was changed to metoprolol succinate on last clinic visit  Medication options limited secondary to renal dysfunction/end-stage renal disease and some medication reluctance  Yesterday at dialysis, nausea, felt poorly  Could not tolerate metoprolol succinate, but was having side effects, she stopped the medication with tach on metoprolol tartrate 12.5 twice daily  Tried to pull more off with HD, Developed severe cramps, had to go back to original time HD on Mon/wed/Fri  Chronic mild lower extremity edema  Labs reviewed: HGB 10.6 TSH 6.48  EKG personally reviewed by myself on todays visit Normal sinus rhythm rate 80 bpm poor R wave progression to the anterior precordial leads, nonspecific ST abnormality, unable to exclude old anterior MI  Of past medical history reviewed In hospital 05/2021 Acute pulmonary edema, requiring HD Sphingobacterium bacteremia HD access declotting D/c  05/25/2021  Recent hospital admission June 09, 2021 Weakness, COVID infection, end-stage renal disease dialysis Treated with remdesivir Initial diagnosis September 6 Had acute metabolic encephalopathy Receiving dialysis Monday Wednesday Friday  Ejection fraction on echocardiogram May 24, 2021 was 35 to 40% Ejection fraction down from prior echo November 2021, EF 60 to 65%  In the hospital for procedure with Dr. Lucky Weiss April 2022, Creation of AV fistula  On HD,  mon/wed/fri  cxr 12/2020 Mild pulmonary edema.  Unchanged cardiomegaly.Mild pulmonary edema. Unchanged cardiomegaly.  on oxygen for SOB, especially with exertion  In hospital 08/11/2020 infected wound to dorsal aspect of right foot  outpatient dialysis since 07/28/20 wound cultures 07/16/2020 show Pseudomonas, Enterococcus and MSSA.  staph warneri. treated with IV vancomycin and IV Zosyn.    permacath on the right side of her neck that was removed by the vascular surgeon on 08/05/2020.   new tunneled dialysis catheter was inserted via right IJ approach on 08/10/2020.    4-week course of IV vancomycin and 3-week course of IV ceftazidime to be given during dialysis in the outpatient setting. amlodipine and hydralazine were discontinued secondary to hypotension  Reports that she is concerned about a sacral decub ulcer, As she is sitting for such long periods of time for dialysis She is sedentary, relatively immobile   previously seen Dr. Lucky Weiss, for venous insufficiency   cardiac catheterization report 08/22/2012 showed distal left main 70% disease, ostial LAD of 80%, proximal LAD 70%, distal LAD diffuse 70%, diagonal #150% followed by discrete 95% lesion, ostial circumflex 90% disease, proximal circumflex 70% disease, proximal RCA 50%, distal RCA 60% PDA branch 60%  PMH:   has a past medical  history of Arthritis, Bilateral Macular degeneration, Breast cancer ([name]) (2012), Breast mass, right, CAD S/P CABG x 4, Cancer  ([place]), Complication of anesthesia, Coronary artery disease (08/22/2012), Diabetes ([place]), Diastolic dysfunction, DOE (dyspnea on exertion), ESRD (end stage renal disease) ([place]), Gastritis, GERD (gastroesophageal reflux disease), Headache(784.0), History of breast cancer, History of seasonal allergies, Hyperlipemia, Hypertension, Hypothyroidism, Neuropathy, Personal history of radiation therapy, Pneumonia, PONV (postoperative nausea and vomiting), Stroke ([place]), and Vertigo.  PSH:    Past Surgical History:  Procedure Laterality Date   A/V FISTULAGRAM Left 01/13/2021   Procedure: A/V FISTULAGRAM;  Surgeon: Sarah Huxley, MD;  Location: [place] [facility];  Service: Cardiovascular;  Laterality: Left;   A/V SHUNT INTERVENTION Left 05/23/2021   Procedure: A/V SHUNT INTERVENTION;  Surgeon: Sarah Huxley, MD;  Location: [place] [facility];  Service: Cardiovascular;  Laterality: Left;   ABDOMINAL HYSTERECTOMY     APPENDECTOMY     AV FISTULA PLACEMENT Left 11/04/2020   Procedure: ARTERIOVENOUS (AV) FISTULA CREATION (BRACHIOCEPHALIC);  Surgeon: Sarah Huxley, MD;  Location: [facility];  Service: Vascular;  Laterality: Left;   BACK SURGERY     BREAST BIOPSY Right    2012 positive- [facility]   BREAST LUMPECTOMY Right 2012   F/U radiation    BREAST SURGERY     CARDIAC CATHETERIZATION  2014   CAROTID PTA/STENT INTERVENTION Left 06/14/2020   Procedure: CAROTID PTA/STENT INTERVENTION;  Surgeon: Sarah Huxley, MD;  Location: [place] [facility];  Service: Cardiovascular;  Laterality: Left;   CATARACT EXTRACTION W/PHACO Right 08/14/2017   Procedure: CATARACT EXTRACTION PHACO AND INTRAOCULAR LENS PLACEMENT (IOC);  Surgeon: Birder Robson, MD;  Location: [facility];  Service: Ophthalmology;  Laterality: Right;  [place] 00:43.0 AP% 17.1 CDE 7.37 Fluid Pack lot # [id number]   CATARACT EXTRACTION W/PHACO Left 10/09/2017   Procedure: CATARACT EXTRACTION PHACO AND INTRAOCULAR LENS PLACEMENT (IOC);  Surgeon: Birder Robson, MD;   Location: [facility];  Service: Ophthalmology;  Laterality: Left;  [place] 00:30.3 AP% 11.0 CDE 3.33 Fluid Pack Lot # [id number]   CORONARY ARTERY BYPASS GRAFT  09/05/2012   Procedure: CORONARY ARTERY BYPASS GRAFTING (CABG);  Surgeon: Rexene Alberts, MD;  Location: [place];  Service: Open Heart Surgery;  Laterality: N/A;  CABG x four, using left internal mammary artery and bilateral greater saphenous vein harvested endoscopically   DIALYSIS/PERMA CATHETER INSERTION Left 06/25/2020   Procedure: DIALYSIS/PERMA CATHETER INSERTION;  Surgeon: Sarah Huxley, MD;  Location: [place] [facility];  Service: Cardiovascular;  Laterality: Left;   DIALYSIS/PERMA CATHETER INSERTION N/A 08/10/2020   Procedure: DIALYSIS/PERMA CATHETER INSERTION;  Surgeon: Katha Cabal, MD;  Location: [place] [facility];  Service: Cardiovascular;  Laterality: N/A;   DIALYSIS/PERMA CATHETER INSERTION N/A 05/23/2021   Procedure: DIALYSIS/PERMA CATHETER INSERTION;  Surgeon: Sarah Huxley, MD;  Location: [place] [facility];  Service: Cardiovascular;  Laterality: N/A;   DIALYSIS/PERMA CATHETER REMOVAL N/A 08/05/2020   Procedure: DIALYSIS/PERMA CATHETER REMOVAL;  Surgeon: Sarah Huxley, MD;  Location: [place] [facility];  Service: Cardiovascular;  Laterality: N/A;   INTRAOPERATIVE TRANSESOPHAGEAL ECHOCARDIOGRAM  09/05/2012   Procedure: INTRAOPERATIVE TRANSESOPHAGEAL ECHOCARDIOGRAM;  Surgeon: Rexene Alberts, MD;  Location: [place];  Service: Open Heart Surgery;  Laterality: N/A;   KNEE SURGERY     Bilateral nerve block   LAPAROSCOPIC NISSEN FUNDOPLICATION     OVARIAN CYST REMOVAL      Current Outpatient Medications  Medication Sig Dispense Refill   ALPRAZolam (XANAX) 0.5 MG tablet Take  1 tablet (0.5 mg total) by mouth 2 (two) times daily as needed for anxiety. 10 tablet 0   aspirin 81 MG chewable tablet Chew 81 mg by mouth every Monday, Wednesday, and Friday.      calcium-vitamin D (OSCAL WITH D) 500-200 MG-UNIT tablet Take 1 tablet by  mouth daily.     clopidogrel (PLAVIX) 75 MG tablet Take 75 mg by mouth daily.     Cyanocobalamin 1000 MCG SUBL Take 1,000 mcg by mouth daily.      famotidine (PEPCID) 10 MG tablet Take 1 tablet (10 mg total) by mouth daily.     gabapentin (NEURONTIN) 100 MG capsule Take 1 capsule by mouth 2 (two) times daily.     HYDROcodone-acetaminophen (NORCO/VICODIN) 5-325 MG tablet Take 1 tablet by mouth every 6 (six) hours as needed for moderate pain. 10 tablet 0   levothyroxine (SYNTHROID) 150 MCG tablet Take by mouth.     OXYGEN Inhale 2 L into the lungs daily.     torsemide (DEMADEX) 100 MG tablet Take 100 mg by mouth 3 (three) times a week. Take 1 tablet three times a week on non-dialysis days.     acetaminophen (TYLENOL) 325 MG tablet Take 2 tablets (650 mg total) by mouth every 6 (six) hours as needed for headache. (Patient not taking: Reported on 08/09/2021)     ondansetron (ZOFRAN ODT) 4 MG disintegrating tablet Take 1 tablet (4 mg total) by mouth every 8 (eight) hours as needed for nausea or vomiting. 20 tablet 1   No current facility-administered medications for this visit.    Allergies:   Clonidine, Darvocet [propoxyphene n-acetaminophen], Hydralazine, Hydralazine hcl, Metoprolol, Esomeprazole, Guaifenesin, Phenylephrine-guaifenesin, Rabeprazole, Telithromycin, Fluocinolone, Iodinated diagnostic agents, Levaquin [levofloxacin], and Statins   Social History:  The patient  reports that she quit smoking about 32 years ago. Her smoking use included cigarettes. She has never used smokeless tobacco. She reports that she does not drink alcohol and does not use drugs.   Family History:   family history includes Breast cancer in her cousin; Cancer in her father; Heart disease in her brother and mother; Hyperlipidemia in her brother; Hypertension in her brother.    Review of Systems: Review of Systems  Constitutional: Negative.   HENT: Negative.    Eyes: Negative.   Respiratory: Negative.     Cardiovascular:  Positive for leg swelling.  Gastrointestinal: Negative.   Musculoskeletal:  Positive for joint pain.       Gait instability  Neurological: Negative.   Psychiatric/Behavioral: Negative.    All other systems reviewed and are negative.  PHYSICAL EXAM: VS:  BP 134/70 (BP Location: Right Arm, Patient Position: Sitting, Cuff Size: Normal)   Pulse 90   Ht 5\' 4"  (1.626 m)   Wt 167 lb (75.8 kg) Comment: Per patient  SpO2 97%   BMI 28.67 kg/m  , BMI Body mass index is 28.67 kg/m. Constitutional:  oriented to person, place, and time. No distress.  Presents in a wheelchair, weak HENT:  Head: Grossly normal Eyes:  no discharge. No scleral icterus.  Neck: Unable to estimate JVD, no carotid bruits  Cardiovascular: Regular rate and rhythm, no murmurs appreciated 1 pitting lower extremity edema up to the mid shins bilaterally Pulmonary/Chest: Clear to auscultation bilaterally, no wheezes or rales Abdominal: Soft.  no distension.  no tenderness.  Musculoskeletal: Normal range of motion Neurological:  normal muscle tone. Coordination normal. No atrophy Skin: Skin warm and dry Psychiatric: normal affect, pleasant  Recent Labs: 05/21/2021: B  Natriuretic Peptide 1,955.9 06/10/2021: Magnesium 2.0 06/14/2021: ALT 5 06/17/2021: BUN 53; Creatinine, Ser 6.11; Hemoglobin 10.1; Platelets 210; Potassium 4.0; Sodium 130    Lipid Panel Lab Results  Component Value Date   CHOL 154 09/09/2012   HDL 31 (L) 09/09/2012   LDLCALC 92 09/09/2012   TRIG 157 (H) 09/09/2012     Wt Readings from Last 3 Encounters:  08/09/21 167 lb (75.8 kg)  07/05/21 166 lb (75.3 kg)  06/09/21 190 lb (86.2 kg)     ASSESSMENT AND PLAN:  Cardiomyopathy Noted on last echocardiogram in the setting of sepsis Ejection fraction moderately reduced, exact etiology unclear She did not tolerate metoprolol succinate, back to metoprolol tartrate Numerous medication intolerances options limited secondary to renal  dysfunction/end-stage renal disease and some medication reluctance -Could consider low-dose nitrates, hydralazine, will hold off for now given chronic nausea  Chronic nausea Seen in emergency room 2 months ago similar symptoms, possibly worse with hemodialysis Refilled her Zofran, discussed when to take this such as prior to HD  Essential hypertension -  Blood pressure stable, continue metoprolol tartrate, weight continues to drop significantly in the past 2 months down 25 pounds  Coronary artery disease involving coronary bypass graft of native heart with angina pectoris (HCC) - Currently with no symptoms of angina. No further workup at this time. Continue current medication regimen.  Pulmonary edema On torsemide 3 times a week On HD 3 times a week  Mixed hyperlipidemia  declined cholesterol medication  S/P CABG x 4  Denies angina, very sedentary  End-stage renal disease on hemodialysis Monday Wednesday Friday, Very mild leg swelling  Anemia, unspecified type  Chronic anemia with slow trend upwards, likely contributing to shortness of breath, weakness, leg swelling  Weakness Had nerve blocks to her knees bilaterally  Sedentary, weak legs, high fall risk   Total encounter time more than 25 minutes  Greater than 50% was spent in counseling and coordination of care with the patient     Orders Placed This Encounter  Procedures   EKG 12-Lead      Signed, Esmond Plants, M.D., Ph.D. 08/09/2021  [facility] [place], [place] [phone]

## 2021-08-09 ENCOUNTER — Encounter: Payer: Self-pay | Admitting: Cardiovascular Disease

## 2021-08-09 ENCOUNTER — Ambulatory Visit (INDEPENDENT_AMBULATORY_CARE_PROVIDER_SITE_OTHER): Payer: Medicare Other | Admitting: Cardiovascular Disease

## 2021-08-09 ENCOUNTER — Other Ambulatory Visit: Payer: Self-pay

## 2021-08-09 VITALS — BP 134/70 | HR 90 | Ht 64.0 in | Wt 167.0 lb

## 2021-08-09 DIAGNOSIS — I25810 Atherosclerosis of coronary artery bypass graft(s) without angina pectoris: Secondary | ICD-10-CM

## 2021-08-09 DIAGNOSIS — E785 Hyperlipidemia, unspecified: Secondary | ICD-10-CM

## 2021-08-09 DIAGNOSIS — Z992 Dependence on renal dialysis: Secondary | ICD-10-CM

## 2021-08-09 DIAGNOSIS — R0602 Shortness of breath: Secondary | ICD-10-CM

## 2021-08-09 DIAGNOSIS — E1159 Type 2 diabetes mellitus with other circulatory complications: Secondary | ICD-10-CM

## 2021-08-09 DIAGNOSIS — Z951 Presence of aortocoronary bypass graft: Secondary | ICD-10-CM

## 2021-08-09 DIAGNOSIS — N186 End stage renal disease: Secondary | ICD-10-CM

## 2021-08-09 DIAGNOSIS — I10 Essential (primary) hypertension: Secondary | ICD-10-CM

## 2021-08-09 DIAGNOSIS — I639 Cerebral infarction, unspecified: Secondary | ICD-10-CM

## 2021-08-09 DIAGNOSIS — E119 Type 2 diabetes mellitus without complications: Secondary | ICD-10-CM

## 2021-08-09 MED ORDER — ONDANSETRON 4 MG PO TBDP: 4.0000 mg | ORAL_TABLET | Freq: Three times a day (TID) | ORAL | 1 refills | Status: DC | PRN

## 2021-08-09 MED ORDER — METOPROLOL TARTRATE 25 MG PO TABS: 12.5000 mg | ORAL_TABLET | Freq: Two times a day (BID) | ORAL | 3 refills | Status: DC

## 2021-08-09 MED ORDER — ONDANSETRON 4 MG PO TBDP: 4.0000 mg | ORAL_TABLET | Freq: Three times a day (TID) | ORAL | 0 refills | Status: DC | PRN

## 2021-08-09 NOTE — Patient Instructions (Addendum)
Medication Instructions:  START metoprolol tartrate 12.5 twice a day Please STOP  metoprolol succinate  We have refilled the zofran/ondansetron Take it for nausea  If you need a refill on your cardiac medications before your next appointment, please call your pharmacy.   Lab work: No new labs needed  Testing/Procedures: No new testing needed  Follow-Up: At [facility], you and your health needs are our priority.  As part of our continuing mission to provide you with exceptional heart care, we have created designated Provider Care Teams.  These Care Teams include your primary Cardiologist (physician) and Advanced Practice Providers (APPs -  Physician Assistants and Nurse Practitioners) who all work together to provide you with the care you need, when you need it.  You will need a follow up appointment in 6 months  Providers on your designated Care Team:   Murray Hodgkins, NP Christell Faith, PA-C Cadence Kathlen Mody, [place]  COVID-19 Vaccine Information can be found at: [email or web address] For questions related to vaccine distribution or appointments, please email [email or web address] or call [phone].

## 2021-08-11 ENCOUNTER — Ambulatory Visit: Payer: Medicare Other | Admitting: Medical

## 2021-11-01 ENCOUNTER — Encounter (INDEPENDENT_AMBULATORY_CARE_PROVIDER_SITE_OTHER): Payer: Self-pay | Admitting: Vascular Surgery

## 2021-11-01 ENCOUNTER — Other Ambulatory Visit: Payer: Self-pay

## 2021-11-01 ENCOUNTER — Ambulatory Visit (INDEPENDENT_AMBULATORY_CARE_PROVIDER_SITE_OTHER): Payer: Medicare Other

## 2021-11-01 ENCOUNTER — Ambulatory Visit (INDEPENDENT_AMBULATORY_CARE_PROVIDER_SITE_OTHER): Payer: Medicare Other | Admitting: Vascular Surgery

## 2021-11-01 VITALS — BP 139/71 | HR 75 | Ht 64.0 in | Wt 162.0 lb

## 2021-11-01 DIAGNOSIS — E782 Mixed hyperlipidemia: Secondary | ICD-10-CM | POA: Diagnosis not present

## 2021-11-01 DIAGNOSIS — E1159 Type 2 diabetes mellitus with other circulatory complications: Secondary | ICD-10-CM | POA: Diagnosis not present

## 2021-11-01 DIAGNOSIS — Z992 Dependence on renal dialysis: Secondary | ICD-10-CM

## 2021-11-01 DIAGNOSIS — N186 End stage renal disease: Secondary | ICD-10-CM | POA: Diagnosis not present

## 2021-11-01 DIAGNOSIS — I10 Essential (primary) hypertension: Secondary | ICD-10-CM | POA: Diagnosis not present

## 2021-11-01 DIAGNOSIS — I6523 Occlusion and stenosis of bilateral carotid arteries: Secondary | ICD-10-CM

## 2021-11-01 NOTE — Progress Notes (Signed)
MRN : [id number]  [name] is a 88 y.o. ([birth year]) female who presents with chief complaint of  Chief Complaint  Patient presents with   Follow-up    Follow up 6 mo HDA  .  History of Present Illness: Patient returns today in follow up of her dialysis access.  She is on continuous oxygen now but has not seen a pulmonary doctor.  I discussed with her today that I think that would be a good idea.  She is doing reasonably well.  She does not like dialysis but her dialysis treatments have been going fine.  Her duplex today shows a couple areas of mildly elevated velocities but otherwise she has good flow volumes and the fistula seems to be working well.  Current Outpatient Medications  Medication Sig Dispense Refill   acetaminophen (TYLENOL) 325 MG tablet Take 2 tablets (650 mg total) by mouth every 6 (six) hours as needed for headache.     ALPRAZolam (XANAX) 0.5 MG tablet Take 1 tablet (0.5 mg total) by mouth 2 (two) times daily as needed for anxiety. 10 tablet 0   aspirin 81 MG chewable tablet Chew 81 mg by mouth every Monday, Wednesday, and Friday.      calcium-vitamin D (OSCAL WITH D) 500-200 MG-UNIT tablet Take 1 tablet by mouth daily.     clopidogrel (PLAVIX) 75 MG tablet Take 75 mg by mouth daily.     Cyanocobalamin 1000 MCG SUBL Take 1,000 mcg by mouth daily.      famotidine (PEPCID) 10 MG tablet Take 1 tablet (10 mg total) by mouth daily.     gabapentin (NEURONTIN) 100 MG capsule Take 1 capsule by mouth 2 (two) times daily.     HYDROcodone-acetaminophen (NORCO/VICODIN) 5-325 MG tablet Take 1 tablet by mouth every 6 (six) hours as needed for moderate pain. 10 tablet 0   levothyroxine (SYNTHROID) 150 MCG tablet Take by mouth.     metoprolol tartrate (LOPRESSOR) 25 MG tablet Take 0.5 tablets (12.5 mg total) by mouth 2 (two) times daily. 90 tablet 3   ondansetron (ZOFRAN ODT) 4 MG disintegrating tablet Take 1 tablet (4 mg total) by mouth every 8 (eight) hours as needed for  nausea or vomiting. 20 tablet 0   OXYGEN Inhale 2 L into the lungs daily.     torsemide (DEMADEX) 100 MG tablet Take 100 mg by mouth 3 (three) times a week. Take 1 tablet three times a week on non-dialysis days.     No current facility-administered medications for this visit.    Past Medical History:  Diagnosis Date   Arthritis    Bilateral Macular degeneration    Breast cancer ([place]) 2012   right breast   Breast mass, right    CAD S/P CABG x 4    a. 09/2012 CABG x 4: LIMA to LAD, SVG to Diag, SVG to OM1, SVG to PDA, EVH from bilateral thighs   Cancer ([place])    breast cancer, right side 2263   Complication of anesthesia    Coronary artery disease 08/22/2012   sees Dr Rockey Situ   Diabetes [facility])    Diastolic dysfunction    a. 06/2020 Echo: EF 60-65%; b. 08/2020 Echo: EF 60-65%, no rwma, Gr1 DD. Nl RV size/fxn.   DOE (dyspnea on exertion)    ESRD (end stage renal disease) ([place])    Gastritis    hx of   GERD (gastroesophageal reflux disease)    Headache(784.0)    migraines  History of breast cancer    39 treatments of radiation. Negative chemo.   History of seasonal allergies    Hyperlipemia    Hypertension    sees Dr. Fulton Reek   Hypothyroidism    Neuropathy    Personal history of radiation therapy    Pneumonia    hx of   PONV (postoperative nausea and vomiting)    Stroke [facility])    2012   Vertigo     Past Surgical History:  Procedure Laterality Date   A/V FISTULAGRAM Left 01/13/2021   Procedure: A/V FISTULAGRAM;  Surgeon: Algernon Huxley, MD;  Location: [place] [facility];  Service: Cardiovascular;  Laterality: Left;   A/V SHUNT INTERVENTION Left 05/23/2021   Procedure: A/V SHUNT INTERVENTION;  Surgeon: Algernon Huxley, MD;  Location: [place] [facility];  Service: Cardiovascular;  Laterality: Left;   ABDOMINAL HYSTERECTOMY     APPENDECTOMY     AV FISTULA PLACEMENT Left 11/04/2020   Procedure: ARTERIOVENOUS (AV) FISTULA CREATION (BRACHIOCEPHALIC);  Surgeon: Algernon Huxley,  MD;  Location: [facility] ORS;  Service: Vascular;  Laterality: Left;   BACK SURGERY     BREAST BIOPSY Right    2012 positive- [facility]   BREAST LUMPECTOMY Right 2012   F/U radiation    BREAST SURGERY     CARDIAC CATHETERIZATION  2014   CAROTID PTA/STENT INTERVENTION Left 06/14/2020   Procedure: CAROTID PTA/STENT INTERVENTION;  Surgeon: Algernon Huxley, MD;  Location: [place] [facility];  Service: Cardiovascular;  Laterality: Left;   CATARACT EXTRACTION W/PHACO Right 08/14/2017   Procedure: CATARACT EXTRACTION PHACO AND INTRAOCULAR LENS PLACEMENT (IOC);  Surgeon: Birder Robson, MD;  Location: [facility] OR[facility];  Service: Ophthalmology;  Laterality: Right;  [place] 00:43.0 AP% 17.1 CDE 7.37 Fluid Pack lot # [id number]   CATARACT EXTRACTION W/PHACO Left 10/09/2017   Procedure: CATARACT EXTRACTION PHACO AND INTRAOCULAR LENS PLACEMENT (IOC);  Surgeon: Birder Robson, MD;  Location: [facility] OR[facility];  Service: Ophthalmology;  Laterality: Left;  [place] 00:30.3 AP% 11.0 CDE 3.33 Fluid Pack Lot # [id number]   CORONARY ARTERY BYPASS GRAFT  09/05/2012   Procedure: CORONARY ARTERY BYPASS GRAFTING (CABG);  Surgeon: Rexene Alberts, MD;  Location: [place];  Service: Open Heart Surgery;  Laterality: N/A;  CABG x four, using left internal mammary artery and bilateral greater saphenous vein harvested endoscopically   DIALYSIS/PERMA CATHETER INSERTION Left 06/25/2020   Procedure: DIALYSIS/PERMA CATHETER INSERTION;  Surgeon: Algernon Huxley, MD;  Location: [place] [facility];  Service: Cardiovascular;  Laterality: Left;   DIALYSIS/PERMA CATHETER INSERTION N/A 08/10/2020   Procedure: DIALYSIS/PERMA CATHETER INSERTION;  Surgeon: Katha Cabal, MD;  Location: [place] [facility];  Service: Cardiovascular;  Laterality: N/A;   DIALYSIS/PERMA CATHETER INSERTION N/A 05/23/2021   Procedure: DIALYSIS/PERMA CATHETER INSERTION;  Surgeon: Algernon Huxley, MD;  Location: [place] [facility];  Service: Cardiovascular;  Laterality: N/A;   DIALYSIS/PERMA  CATHETER REMOVAL N/A 08/05/2020   Procedure: DIALYSIS/PERMA CATHETER REMOVAL;  Surgeon: Algernon Huxley, MD;  Location: [place] [facility];  Service: Cardiovascular;  Laterality: N/A;   INTRAOPERATIVE TRANSESOPHAGEAL ECHOCARDIOGRAM  09/05/2012   Procedure: INTRAOPERATIVE TRANSESOPHAGEAL ECHOCARDIOGRAM;  Surgeon: Rexene Alberts, MD;  Location: [place];  Service: Open Heart Surgery;  Laterality: N/A;   KNEE SURGERY     Bilateral nerve block   LAPAROSCOPIC NISSEN FUNDOPLICATION     OVARIAN CYST REMOVAL       Social History   Tobacco Use   Smoking status: Former  Years: 3.00    Types: Cigarettes    Quit date: 11/13/1988    Years since quitting: 32.9   Smokeless tobacco: Never   Tobacco comments:    At the most 1 pack would last over a week.  Vaping Use   Vaping Use: Never used  Substance Use Topics   Alcohol use: No   Drug use: No      Family History  Problem Relation Age of Onset   Heart disease Brother        CABG & stents   Hyperlipidemia Brother    Hypertension Brother    Cancer Father    Heart disease Mother    Breast cancer Cousin    Kidney disease Neg Hx      Allergies  Allergen Reactions   Clonidine Other (See Comments) and Shortness Of Breath    Other reaction(s): Other (see comments) Other reaction(s): Other (See Comments)   Darvocet [Propoxyphene N-Acetaminophen] Anaphylaxis   Hydralazine     Other reaction(s): Other (See Comments) glomerulonephritis   Hydralazine Hcl Other (See Comments)    glomerulonephritis   Metoprolol Shortness Of Breath    Pt reports "very winded, couldn't catch breath"    Esomeprazole     Other reaction(s): Unknown   Guaifenesin     Other reaction(s): Unknown Other reaction(s): Unknown Other reaction(s): Unknown Other reaction(s): Unknown   Phenylephrine-Guaifenesin     Other reaction(s): Unknown   Rabeprazole     Other reaction(s): Unknown   Telithromycin     Other reaction(s): Unknown Other reaction(s): Unknown Other  reaction(s): Unknown   Fluocinolone Other (See Comments)    Feels crazy Other reaction(s): Other (see comments) Other reaction(s): Other (See Comments) Feels crazy Feels crazy   Iodinated Contrast Media Nausea And Vomiting and Other (See Comments)    Burning Other reaction(s): Other (see comments) Other reaction(s): Other (See Comments) Burning Nausea & vomiting   Levaquin [Levofloxacin] Nausea Only   Statins Other (See Comments)    Myalgia Other reaction(s): Other (See Comments), Unknown, Unknown Myalgia    REVIEW OF SYSTEMS (Negative unless checked)   Constitutional: [] Weight loss  [] Fever  [] Chills Cardiac: [] Chest pain   [] Chest pressure   [] Palpitations   [] Shortness of breath when laying flat   [x] Shortness of breath at rest   [x] Shortness of breath with exertion. Vascular:  [] Pain in legs with walking   [] Pain in legs at rest   [] Pain in legs when laying flat   [] Claudication   [] Pain in feet when walking  [] Pain in feet at rest  [] Pain in feet when laying flat   [] History of DVT   [] Phlebitis   [x] Swelling in legs   [] Varicose veins   [x] Non-healing ulcers Pulmonary:   [] Uses home oxygen   [] Productive cough   [] Hemoptysis   [] Wheeze  [] COPD   [] Asthma Neurologic:  [x] Dizziness  [] Blackouts   [] Seizures   [] History of stroke   [] History of TIA  [] Aphasia   [] Temporary blindness   [] Dysphagia   [] Weakness or numbness in arms   [] Weakness or numbness in legs Musculoskeletal:  [x] Arthritis   [] Joint swelling   [x] Joint pain   [] Low back pain Hematologic:  [] Easy bruising  [] Easy bleeding   [] Hypercoagulable state   [x] Anemic   Gastrointestinal:  [] Blood in stool   [] Vomiting blood  [] Gastroesophageal reflux/heartburn   [] Abdominal pain Genitourinary:  [x] Chronic kidney disease   [] Difficult urination  [] Frequent urination  [] Burning with urination   [] Hematuria Skin:  [] Rashes   [  x]Ulcers   [x] Wounds Psychological:  [] History of anxiety   []  History of major  depression.  Physical Examination  BP 139/71    Pulse 75    Ht 5\' 4"  (1.626 m)    Wt 162 lb (73.5 kg)    BMI 27.81 kg/m  Gen:  WD/WN, NAD Head: [place]/AT, No temporalis wasting. Ear/Nose/Throat: Hearing grossly intact, nares w/o erythema or drainage Eyes: Conjunctiva clear. Sclera non-icteric Neck: Supple.  Trachea midline Pulmonary:  Good air movement, no use of accessory muscles.  Cardiac: RRR, no JVD Vascular: Good thrill in left arm AV fistula Vessel Right Left  Radial Palpable Palpable               Musculoskeletal: M/S 5/5 throughout.  No deformity or atrophy.  1-2+ bilateral lower extremity edema.  In a wheelchair Neurologic: Sensation grossly intact in extremities.  Symmetrical.  Speech is fluent.  Psychiatric: Judgment intact, Mood & affect appropriate for pt's clinical situation. Dermatologic: No rashes or ulcers noted.  No cellulitis or open wounds.      Labs No results found for this or any previous visit (from the past 2160 hour(s)).  Radiology No results found.  Assessment/Plan Diabetes mellitus ([place]) blood glucose control important in reducing the progression of atherosclerotic disease. Also, involved in wound healing. On appropriate medications.     Hyperlipidemia lipid control important in reducing the progression of atherosclerotic disease. Continue statin therapy  Essential hypertension blood pressure control important in reducing the progression of atherosclerotic disease. On appropriate oral medications.   Carotid stenosis No recent focal symptoms.  Very poor surgical candidate.  Recheck carotid duplex at next visit  ESRD (end stage renal disease) on dialysis [facility]) Her duplex today shows a couple areas of mildly elevated velocities but otherwise she has good flow volumes and the fistula seems to be working well.  No role for intervention at this time.  Continue to use the access and rotate the access sites as tolerated.  Recheck in 6  months    [name], MD  11/01/2021 12:55 PM    This note was created with Dragon medical transcription system.  Any errors from dictation are purely unintentional

## 2021-11-01 NOTE — Assessment & Plan Note (Signed)
Her duplex today shows a couple areas of mildly elevated velocities but otherwise she has good flow volumes and the fistula seems to be working well.  No role for intervention at this time.  Continue to use the access and rotate the access sites as tolerated.  Recheck in 6 months

## 2021-11-01 NOTE — Assessment & Plan Note (Signed)
blood pressure control important in reducing the progression of atherosclerotic disease. On appropriate oral medications.  

## 2021-11-01 NOTE — Assessment & Plan Note (Signed)
No recent focal symptoms.  Very poor surgical candidate.  Recheck carotid duplex at next visit

## 2021-11-15 ENCOUNTER — Ambulatory Visit: Payer: Medicare Other | Admitting: Adult Health

## 2021-12-20 ENCOUNTER — Ambulatory Visit (INDEPENDENT_AMBULATORY_CARE_PROVIDER_SITE_OTHER): Payer: Medicare Other | Admitting: Vascular Surgery

## 2021-12-20 ENCOUNTER — Encounter (INDEPENDENT_AMBULATORY_CARE_PROVIDER_SITE_OTHER): Payer: Medicare Other

## 2022-01-03 ENCOUNTER — Encounter: Payer: Self-pay | Admitting: Primary Care

## 2022-01-03 ENCOUNTER — Ambulatory Visit (INDEPENDENT_AMBULATORY_CARE_PROVIDER_SITE_OTHER): Payer: Medicare Other | Admitting: Primary Care

## 2022-01-03 VITALS — BP 136/72 | HR 69 | Temp 98.2°F | Ht 64.0 in | Wt 160.0 lb

## 2022-01-03 DIAGNOSIS — I5042 Chronic combined systolic (congestive) and diastolic (congestive) heart failure: Secondary | ICD-10-CM | POA: Diagnosis not present

## 2022-01-03 DIAGNOSIS — J9611 Chronic respiratory failure with hypoxia: Secondary | ICD-10-CM | POA: Diagnosis not present

## 2022-01-03 DIAGNOSIS — N185 Chronic kidney disease, stage 5: Secondary | ICD-10-CM | POA: Diagnosis not present

## 2022-01-03 NOTE — Assessment & Plan Note (Signed)
-   Receiving HD MWF  ?

## 2022-01-03 NOTE — Progress Notes (Signed)
? ?'@Patient'$  ID: [name], female    DOB: 1934-04-16, 88 y.o.   MRN: [id number] ? ?Chief Complaint  ?Patient presents with  ? Follow-up  ?  Breathing is overall doing well. She denies any new co's.   ? ? ?Referring provider: ?Idelle Crouch, MD ? ?HPI: ?88 year old female, former smoker. PMH significant for cardiomegaly, diastolic heart failure, cor pulmonale, ESRD on dialysis, pulmonary edema, chronic respiratory failure with hypoxia, type 2 diabetes.  ? ?01/03/2022 ?She is doing well, no acute complaints. Her breathing is alright. She will get out of breath with a lot of exertion which she states doesn't happen often cause her husband does most things. States that she has a bad habit of breathing out her mouth instead of through her nose. She is fairly immobile. She does not do a whole lot of moving, mostly in the chair during the day. She is on 2L oxygen 24/7. She is wondering if she can come off oxygen. She has had physical therapy in the past, not currently receiving. Her legs are swollen, which is not new. She takes Torsemide '100mg'$  three times a week on non-dialysis days. She receives dialysis [facility]. She has declined sleep study in the past as well as today, states that she is claustrophobia and would not be able to wear CPAP. Denies cough, chest tightness or wheezing.  ? ? ?Allergies  ?Allergen Reactions  ? Clonidine Other (See Comments) and Shortness Of Breath  ?  Other reaction(s): Other (see comments) ?Other reaction(s): Other (See Comments)  ? Darvocet [Propoxyphene N-Acetaminophen] Anaphylaxis  ? Hydralazine   ?  Other reaction(s): Other (See Comments) ?glomerulonephritis  ? Hydralazine Hcl Other (See Comments)  ?  glomerulonephritis  ? Metoprolol Shortness Of Breath  ?  Pt reports "very winded, couldn't catch breath"   ? Esomeprazole   ?  Other reaction(s): Unknown  ? Guaifenesin   ?  Other reaction(s): Unknown ?Other reaction(s): Unknown ?Other reaction(s): Unknown ?Other reaction(s): Unknown  ?  Phenylephrine-Guaifenesin   ?  Other reaction(s): Unknown  ? Rabeprazole   ?  Other reaction(s): Unknown  ? Telithromycin   ?  Other reaction(s): Unknown ?Other reaction(s): Unknown ?Other reaction(s): Unknown  ? Fluocinolone Other (See Comments)  ?  Feels crazy ?Other reaction(s): Other (see comments) ?Other reaction(s): Other (See Comments) ?Feels crazy ?Feels crazy  ? Iodinated Contrast Media Nausea And Vomiting and Other (See Comments)  ?  Burning ?Other reaction(s): Other (see comments) ?Other reaction(s): Other (See Comments) ?Burning ?Nausea & vomiting  ? Levaquin [Levofloxacin] Nausea Only  ? Statins Other (See Comments)  ?  Myalgia ?Other reaction(s): Other (See Comments), Unknown, Unknown ?Myalgia  ? ? ?Immunization History  ?Administered Date(s) Administered  ? Influenza-Unspecified 11/29/2020  ? Pneumococcal-Unspecified 11/02/2016, 11/29/2020  ? ? ?Past Medical History:  ?Diagnosis Date  ? Arthritis   ? Bilateral Macular degeneration   ? Breast cancer ([place]) 2012  ? right breast  ? Breast mass, right   ? CAD S/P CABG x 4   ? a. 09/2012 CABG x 4: LIMA to LAD, SVG to Diag, SVG to OM1, SVG to PDA, EVH from bilateral thighs  ? Cancer [facility])   ? breast cancer, right side 2012  ? Complication of anesthesia   ? Coronary artery disease 08/22/2012  ? sees Dr Rockey Situ  ? Diabetes ([place])   ? Diastolic dysfunction   ? a. 06/2020 Echo: EF 60-65%; b. 08/2020 Echo: EF 60-65%, no rwma, Gr1 DD. Nl RV size/fxn.  ?  DOE (dyspnea on exertion)   ? ESRD (end stage renal disease) ([place])   ? Gastritis   ? hx of  ? GERD (gastroesophageal reflux disease)   ? Headache(784.0)   ? migraines  ? History of breast cancer   ? 39 treatments of radiation. Negative chemo.  ? History of seasonal allergies   ? Hyperlipemia   ? Hypertension   ? sees Dr. Fulton Reek  ? Hypothyroidism   ? Neuropathy   ? Personal history of radiation therapy   ? Pneumonia   ? hx of  ? PONV (postoperative nausea and vomiting)   ? Stroke [facility])   ? 2012  ? Vertigo    ? ? ?Tobacco History: ?Social History  ? ?Tobacco Use  ?Smoking Status Former  ? Years: 3.00  ? Types: Cigarettes  ? Quit date: 11/13/1988  ? Years since quitting: 33.1  ?Smokeless Tobacco Never  ?Tobacco Comments  ? At the most 1 pack would last over a week.  ? ?Counseling given: Not Answered ?Tobacco comments: At the most 1 pack would last over a week. ? ? ?Outpatient Medications Prior to Visit  ?Medication Sig Dispense Refill  ? acetaminophen (TYLENOL) 325 MG tablet Take 2 tablets (650 mg total) by mouth every 6 (six) hours as needed for headache.    ? ALPRAZolam (XANAX) 0.5 MG tablet Take 1 tablet (0.5 mg total) by mouth 2 (two) times daily as needed for anxiety. 10 tablet 0  ? aspirin 81 MG chewable tablet Chew 81 mg by mouth every Monday, Wednesday, and Friday.     ? calcium-vitamin D (OSCAL WITH D) 500-200 MG-UNIT tablet Take 1 tablet by mouth daily.    ? clopidogrel (PLAVIX) 75 MG tablet Take 75 mg by mouth daily.    ? Cyanocobalamin 1000 MCG SUBL Take 1,000 mcg by mouth daily.     ? famotidine (PEPCID) 10 MG tablet Take 1 tablet (10 mg total) by mouth daily.    ? gabapentin (NEURONTIN) 100 MG capsule Take 1 capsule by mouth 2 (two) times daily.    ? HYDROcodone-acetaminophen (NORCO/VICODIN) 5-325 MG tablet Take 1 tablet by mouth every 6 (six) hours as needed for moderate pain. 10 tablet 0  ? levothyroxine (SYNTHROID) 150 MCG tablet Take by mouth.    ? ondansetron (ZOFRAN ODT) 4 MG disintegrating tablet Take 1 tablet (4 mg total) by mouth every 8 (eight) hours as needed for nausea or vomiting. 20 tablet 0  ? OXYGEN Inhale 2 L into the lungs daily.    ? torsemide (DEMADEX) 100 MG tablet Take 100 mg by mouth 3 (three) times a week. Take 1 tablet three times a week on non-dialysis days.    ? metoprolol tartrate (LOPRESSOR) 25 MG tablet Take 0.5 tablets (12.5 mg total) by mouth 2 (two) times daily. 90 tablet 3  ? ?No facility-administered medications prior to visit.  ? ?Review of Systems ? ?Review of Systems   ?Constitutional: Negative.   ?HENT:  Positive for postnasal drip.   ?Respiratory:  Negative for apnea, cough, shortness of breath and wheezing.   ?Cardiovascular:  Positive for leg swelling.  ? ? ?Physical Exam ? ?BP 136/72 (BP Location: Left Arm, Cuff Size: Normal)   Pulse 69   Temp 98.2 ?F (36.8 ?C) (Oral)   Ht '5\' 4"'$  (1.626 m)   Wt 160 lb (72.6 kg)   SpO2 97% Comment: on 2lpm cont o2  BMI 27.46 kg/m?  ?Physical Exam ?Constitutional:   ?   General: She  is not in acute distress. ?   Appearance: Normal appearance. She is not ill-appearing.  ?HENT:  ?   Head: Normocephalic and atraumatic.  ?   Mouth/Throat:  ?   Mouth: Mucous membranes are moist.  ?   Pharynx: Oropharynx is clear.  ?Cardiovascular:  ?   Rate and Rhythm: Normal rate.  ?   Comments: +3-4 BLE edema ?Pulmonary:  ?   Effort: Pulmonary effort is normal.  ?   Breath sounds: Rales present. No wheezing or rhonchi.  ?Skin: ?   General: Skin is warm and dry.  ?Neurological:  ?   General: No focal deficit present.  ?   Mental Status: She is alert and oriented to person, place, and time. Mental status is at baseline.  ?Psychiatric:     ?   Mood and Affect: Mood normal.     ?   Behavior: Behavior normal.     ?   Thought Content: Thought content normal.     ?   Judgment: Judgment normal.  ?  ? ?Lab Results: ? ?CBC ?   ?Component Value Date/Time  ? WBC 7.8 06/17/2021 0523  ? RBC 3.23 (L) 06/17/2021 0523  ? HGB 10.1 (L) 06/17/2021 0523  ? HGB 12.7 05/07/2014 0516  ? HCT 32.3 (L) 06/17/2021 0523  ? HCT 37.2 05/07/2014 0516  ? PLT 210 06/17/2021 0523  ? PLT 224 05/07/2014 0516  ? MCV 100.0 06/17/2021 0523  ? MCV 86 05/07/2014 0516  ? MCH 31.3 06/17/2021 0523  ? MCHC 31.3 06/17/2021 0523  ? RDW 16.4 (H) 06/17/2021 0523  ? RDW 14.1 05/07/2014 0516  ? LYMPHSABS 1.0 06/10/2021 0508  ? LYMPHSABS 2.3 05/07/2014 0516  ? MONOABS 0.5 06/10/2021 0508  ? MONOABS 1.1 (H) 05/07/2014 0516  ? EOSABS 0.1 06/10/2021 0508  ? EOSABS 0.0 05/07/2014 0516  ? BASOSABS 0.0 06/10/2021  0508  ? BASOSABS 0.0 05/07/2014 0516  ? ? ?BMET ?   ?Component Value Date/Time  ? NA 130 (L) 06/17/2021 0523  ? NA 140 11/22/2015 0830  ? NA 142 05/07/2014 0516  ? K 4.0 06/17/2021 0523  ? K 3.2 (L) 05/07/2014 9323

## 2022-01-03 NOTE — Patient Instructions (Addendum)
Recommendations: ?Continue to wear 2L oxygen 24/7 (O2 was 83-86% on room air, need to continue to wear oxygen at all times) ?Continue Torsemide '100mg'$  on non-dialysis days (Tuesday, Thursday, Saturday/Sunday) ? ?Follow-up: ?6 months with Dr. Mortimer Fries or sooner if needed  ?

## 2022-01-03 NOTE — Assessment & Plan Note (Signed)
-   Does not appear acutely exacerbated. No shortness of breath. She has chronic +3-4 BLE edema. Taking Torsemide '100mg'$  on non-dialysis days.  ?

## 2022-01-03 NOTE — Assessment & Plan Note (Addendum)
-   No acute respiratory symptoms. She is compliant with 2L oxygen 24/7, wanting to see if she can come off supplemental O2. Oxygen at rest today on room air was 83-86%. She is unable to walk in office today. Not very active at home. Advised she needs to continue to wear 2L oxygen at all times. Refusing sleep study. FU in 6 months or sooner.  ?

## 2022-01-26 ENCOUNTER — Encounter: Payer: Medicare Other | Attending: Internal Medicine | Admitting: Internal Medicine

## 2022-01-26 DIAGNOSIS — Z8673 Personal history of transient ischemic attack (TIA), and cerebral infarction without residual deficits: Secondary | ICD-10-CM | POA: Insufficient documentation

## 2022-01-26 DIAGNOSIS — J449 Chronic obstructive pulmonary disease, unspecified: Secondary | ICD-10-CM | POA: Diagnosis not present

## 2022-01-26 DIAGNOSIS — E11621 Type 2 diabetes mellitus with foot ulcer: Secondary | ICD-10-CM | POA: Diagnosis present

## 2022-01-26 DIAGNOSIS — Z992 Dependence on renal dialysis: Secondary | ICD-10-CM | POA: Diagnosis not present

## 2022-01-26 DIAGNOSIS — I2510 Atherosclerotic heart disease of native coronary artery without angina pectoris: Secondary | ICD-10-CM | POA: Insufficient documentation

## 2022-01-26 DIAGNOSIS — Z951 Presence of aortocoronary bypass graft: Secondary | ICD-10-CM | POA: Diagnosis not present

## 2022-01-26 DIAGNOSIS — Z87891 Personal history of nicotine dependence: Secondary | ICD-10-CM | POA: Insufficient documentation

## 2022-01-26 DIAGNOSIS — I5040 Unspecified combined systolic (congestive) and diastolic (congestive) heart failure: Secondary | ICD-10-CM | POA: Insufficient documentation

## 2022-01-26 DIAGNOSIS — N057 Unspecified nephritic syndrome with diffuse crescentic glomerulonephritis: Secondary | ICD-10-CM | POA: Diagnosis not present

## 2022-01-26 DIAGNOSIS — Z7952 Long term (current) use of systemic steroids: Secondary | ICD-10-CM | POA: Diagnosis not present

## 2022-01-26 DIAGNOSIS — I132 Hypertensive heart and chronic kidney disease with heart failure and with stage 5 chronic kidney disease, or end stage renal disease: Secondary | ICD-10-CM | POA: Diagnosis not present

## 2022-01-26 DIAGNOSIS — N186 End stage renal disease: Secondary | ICD-10-CM | POA: Insufficient documentation

## 2022-01-26 DIAGNOSIS — E1151 Type 2 diabetes mellitus with diabetic peripheral angiopathy without gangrene: Secondary | ICD-10-CM | POA: Diagnosis not present

## 2022-01-26 DIAGNOSIS — E1122 Type 2 diabetes mellitus with diabetic chronic kidney disease: Secondary | ICD-10-CM | POA: Insufficient documentation

## 2022-01-26 DIAGNOSIS — I6529 Occlusion and stenosis of unspecified carotid artery: Secondary | ICD-10-CM | POA: Diagnosis not present

## 2022-01-27 NOTE — Progress Notes (Signed)
JEANNINE, PENNISI TMarland Kitchen ([id number]) ?Visit Report for 01/26/2022 ?Allergy List Details ?[name], [name] ?Date of Service: 01/26/2022 8:45 AM ?Medical Record Number: [id number] ?Patient Account Number: [id number] ?Date of Birth/Sex: [birth year] (88 y.o. F) ?Treating RN: Carlene Coria ?Primary Care Emagene Merfeld: Fulton Reek Other Clinician: ?Referring Sindhu Nguyen: Referral, Self ?Treating Dailynn Nancarrow/Extender: Ricard Dillon ?Weeks in Treatment: 0 ?Allergies ?Active Allergies ?clonidine ?Reaction: sob ?Darvocet-N ?hydralazine ?esomeprazole ?guaifenesin ?phenylephrine ?rabeprazole ?telithromycin ?fluocinonide ?Iodinated Contrast Media ?Levaquin ?Reaction: nausea ?Statins-Hmg-Coa Reductase Inhibitors ?Allergy Notes ?Electronic Signature(s) ?Signed: 01/27/2022 1:52:28 PM By: Carlene Coria RN ?Entered ByCarlene Coria on 01/26/2022 08:45:24 ?CRISTIN, PENAFLOR T. ([id number]) ?-------------------------------------------------------------------------------- ?Arrival Information Details ?Patient Name: Sarah, Weiss ?Date of Service: 01/26/2022 8:45 AM ?Medical Record Number: [id number] ?Patient Account Number: [id number] ?Date of Birth/Sex: [birth year] (88 y.o. F) ?Treating RN: Carlene Coria ?Primary Care Hawraa Stambaugh: Fulton Reek Other Clinician: ?Referring Deiondre Harrower: Referral, Self ?Treating Markanthony Gedney/Extender: Ricard Dillon ?Weeks in Treatment: 0 ?Visit Information ?Patient Arrived: Wheel Chair ?Arrival Time: 08:36 ?Accompanied By: husband ?Transfer Assistance: None ?Patient Identification Verified: Yes ?Secondary Verification Process Completed: Yes ?Patient Requires Transmission-Based No ?Precautions: ?Patient Has Alerts: Yes ?Patient Alerts: Patient on Blood ?Thinner ?History Since Last Visit ?All ordered tests and consults were completed: No ?Added or deleted any medications: No ?Any new allergies or adverse reactions: No ?Had a fall or experienced change in activities of daily living that may affect risk of falls:  No ?Signs or symptoms of abuse/neglect since last visito No ?Hospitalized since last visit: No ?Implantable device outside of the clinic excluding cellular tissue based products placed in the center since last visit: No ?Has Dressing in Place as Prescribed: Yes ?Electronic Signature(s) ?Signed: 01/27/2022 1:52:28 PM By: Carlene Coria RN ?Entered By: Carlene Coria on 01/26/2022 08:43:32 ?JEANI, FASSNACHT T. ([id number]) ?-------------------------------------------------------------------------------- ?Clinic Level of Care Assessment Details ?Patient Name: Sarah, Weiss ?Date of Service: 01/26/2022 8:45 AM ?Medical Record Number: [id number] ?Patient Account Number: [id number] ?Date of Birth/Sex: 1934/04/19 (88 y.o. F) ?Treating RN: Carlene Coria ?Primary Care Phyllicia Dudek: Fulton Reek Other Clinician: ?Referring Naje Rice: Referral, Self ?Treating Marranda Arakelian/Extender: Ricard Dillon ?Weeks in Treatment: 0 ?Clinic Level of Care Assessment Items ?TOOL 4 Quantity Score ?X - Use when only an EandM is performed on FOLLOW-UP visit 1 0 ?ASSESSMENTS - Nursing Assessment / Reassessment ?X - Reassessment of Co-morbidities (includes updates in patient status) 1 10 ?X- 1 5 ?Reassessment of Adherence to Treatment Plan ?ASSESSMENTS - Wound and Skin Assessment / Reassessment ?X - Simple Wound Assessment / Reassessment - one wound 1 5 ?'[]'$  - 0 ?Complex Wound Assessment / Reassessment - multiple wounds ?'[]'$  - 0 ?Dermatologic / Skin Assessment (not related to wound area) ?ASSESSMENTS - Focused Assessment ?'[]'$  - Circumferential Edema Measurements - multi extremities 0 ?'[]'$  - 0 ?Nutritional Assessment / Counseling / Intervention ?'[]'$  - 0 ?Lower Extremity Assessment (monofilament, tuning fork, pulses) ?'[]'$  - 0 ?Peripheral Arterial Disease Assessment (using hand held doppler) ?ASSESSMENTS - Ostomy and/or Continence Assessment and Care ?'[]'$  - Incontinence Assessment and Management 0 ?'[]'$  - 0 ?Ostomy Care Assessment and Management (repouching,  etc.) ?PROCESS - Coordination of Care ?X - Simple Patient / Family Education for ongoing care 1 15 ?'[]'$  - 0 ?Complex (extensive) Patient / Family Education for ongoing care ?'[]'$  - 0 ?Staff obtains Consents, Records, Test Results / Process Orders ?'[]'$  - 0 ?Staff telephones HHA, Nursing Homes / Clarify orders / etc ?'[]'$  - 0 ?Routine Transfer to another Facility (non-emergent condition) ?'[]'$  - 0 ?Routine Hospital Admission (non-emergent condition) ?'[]'$  - 0 ?  New Admissions / Biomedical engineer / Ordering NPWT, Apligraf, etc. ?'[]'$  - 0 ?Emergency Hospital Admission (emergent condition) ?X- 1 10 ?Simple Discharge Coordination ?'[]'$  - 0 ?Complex (extensive) Discharge Coordination ?PROCESS - Special Needs ?'[]'$  - Pediatric / Minor Patient Management 0 ?'[]'$  - 0 ?Isolation Patient Management ?'[]'$  - 0 ?Hearing / Language / Visual special needs ?'[]'$  - 0 ?Assessment of Community assistance (transportation, D/C planning, etc.) ?'[]'$  - 0 ?Additional assistance / Altered mentation ?'[]'$  - 0 ?Support Surface(s) Assessment (bed, cushion, seat, etc.) ?INTERVENTIONS - Wound Cleansing / Measurement ?JAMILEE, LAFOSSE TMarland Kitchen ([id number]) ?X- 1 5 ?Simple Wound Cleansing - one wound ?'[]'$  - 0 ?Complex Wound Cleansing - multiple wounds ?X- 1 5 ?Wound Imaging (photographs - any number of wounds) ?'[]'$  - 0 ?Wound Tracing (instead of photographs) ?X- 1 5 ?Simple Wound Measurement - one wound ?'[]'$  - 0 ?Complex Wound Measurement - multiple wounds ?INTERVENTIONS - Wound Dressings ?X - Small Wound Dressing one or multiple wounds 1 10 ?'[]'$  - 0 ?Medium Wound Dressing one or multiple wounds ?'[]'$  - 0 ?Large Wound Dressing one or multiple wounds ?'[]'$  - 0 ?Application of Medications - topical ?'[]'$  - 0 ?Application of Medications - injection ?INTERVENTIONS - Miscellaneous ?'[]'$  - External ear exam 0 ?'[]'$  - 0 ?Specimen Collection (cultures, biopsies, blood, body fluids, etc.) ?'[]'$  - 0 ?Specimen(s) / Culture(s) sent or taken to Lab for analysis ?'[]'$  - 0 ?Patient Transfer (multiple staff /  Civil Service fast streamer / Similar devices) ?'[]'$  - 0 ?Simple Staple / Suture removal (25 or less) ?'[]'$  - 0 ?Complex Staple / Suture removal (26 or more) ?'[]'$  - 0 ?Hypo / Hyperglycemic Management (close monitor of Blood Glucose) ?X- 1 15 ?Ankle / Brachial Index (ABI) - do not check if billed separately ?X- 1 5 ?Vital Signs ?Has the patient been seen at the hospital within the last three years: Yes ?Total Score: 90 ?Level Of Care: New/Established - Level ?3 ?Electronic Signature(s) ?Signed: 01/27/2022 1:52:28 PM By: Carlene Coria RN ?Entered ByCarlene Coria on 01/26/2022 16:29:21 ?KEYLIE, BEAVERS T. ([id number]) ?-------------------------------------------------------------------------------- ?Encounter Discharge Information Details ?Patient Name: ANISE, HARBIN ?Date of Service: 01/26/2022 8:45 AM ?Medical Record Number: [id number] ?Patient Account Number: [id number] ?Date of Birth/Sex: 07/29/1934 (88 y.o. F) ?Treating RN: Carlene Coria ?Primary Care Garin Mata: Fulton Reek Other Clinician: ?Referring Kyzer Blowe: Referral, Self ?Treating Zohal Reny/Extender: Ricard Dillon ?Weeks in Treatment: 0 ?Encounter Discharge Information Items ?Discharge Condition: Stable ?Ambulatory Status: Wheelchair ?Discharge Destination: Home ?Transportation: Private Auto ?Accompanied By: self ?Schedule Follow-up Appointment: Yes ?Clinical Summary of Care: Patient Declined ?Electronic Signature(s) ?Signed: 01/26/2022 4:30:30 PM By: Carlene Coria RN ?Entered By: Carlene Coria on 01/26/2022 16:30:30 ?JALEE, SAINE T. ([id number]) ?-------------------------------------------------------------------------------- ?Lower Extremity Assessment Details ?Patient Name: KAYSEN, SEFCIK ?Date of Service: 01/26/2022 8:45 AM ?Medical Record Number: [id number] ?Patient Account Number: [id number] ?Date of Birth/Sex: Sep 29, 1934 (88 y.o. F) ?Treating RN: Carlene Coria ?Primary Care Capria Cartaya: Fulton Reek Other Clinician: ?Referring Deaundra Dupriest: Referral, Self ?Treating  Josalynn Johndrow/Extender: Ricard Dillon ?Weeks in Treatment: 0 ?Edema Assessment ?Assessed: [Left: No] [Right: No] ?Edema: [Left: Ye] [Right: s] ?Calf ?Left: Right: ?Point of Measurement: 30 cm From Medial Instep 39 c

## 2022-01-27 NOTE — Progress Notes (Signed)
[name] ([id number]) ?Visit Report for 01/26/2022 ?Abuse Risk Screen Details ?[name] ?Date of Service: 01/26/2022 8:45 AM ?Medical Record Number: [id number] ?Patient Account Number: [id number] ?Date of Birth/Sex: 1934-08-02 (88 y.o. F) ?Treating RN: [name] ?Primary Care [name]: [name] Other Clinician: ?Referring [name]: Referral, Self ?Treating Lauris Keepers/Extender: [name] ?Weeks in Treatment: 0 ?Abuse Risk Screen Items ?Answer ?ABUSE RISK SCREEN: ?Has anyone close to you tried to hurt or harm you recentlyo No ?Do you feel uncomfortable with anyone in your familyo No ?Has anyone forced you do things that you didnot want to doo No ?Electronic Signature(s) ?Signed: 01/27/2022 1:52:28 PM By: Sarah Coria RN ?Entered By: [name] on 01/26/2022 08:46:07 ?[name], [name] ([id number]) ?-------------------------------------------------------------------------------- ?Activities of Daily Living Details ?[name] ?Date of Service: 01/26/2022 8:45 AM ?Medical Record Number: [id number] ?Patient Account Number: [id number] ?Date of Birth/Sex: [birth year] (88 y.o. F) ?Treating RN: [name] ?Primary Care [name]: [name] Other Clinician: ?Referring [name]: Referral, Self ?Treating Koy Lamp/Extender: [name] ?Weeks in Treatment: 0 ?Activities of Daily Living Items ?Answer ?Activities of Daily Living (Please select one for each item) ?Drive Automobile Not Able ?Take Medications Completely Able ?Use Telephone Completely Able ?Care for Appearance Completely Able ?Use Toilet Completely Able ?Bath / Shower Completely Able ?Dress Self Completely Able ?Feed Self Completely Able ?Walk Need Assistance ?Get In / Out Bed Completely Able ?Housework Completely Able ?Prepare Meals Completely Able ?Handle Money Completely Able ?Shop for Self Completely Able ?Electronic Signature(s) ?Signed: 01/27/2022 1:52:28 PM By: Sarah Coria  RN ?Entered ByCarlene Weiss on 01/26/2022 08:46:54 ?[name], [name] ([id number]) ?-------------------------------------------------------------------------------- ?Education Screening Details ?[name] ?Date of Service: 01/26/2022 8:45 AM ?Medical Record Number: [id number] ?Patient Account Number: [id number] ?Date of Birth/Sex: 1934-07-17 (88 y.o. F) ?Treating RN: [name] ?Primary Care Kaston Faughn: [name] Other Clinician: ?Referring [name]: Referral, Self ?Treating Macil Weiss/Extender: [name] ?Weeks in Treatment: 0 ?Primary Learner Assessed: Patient ?Learning Preferences/Education Level/Primary Language ?Learning Preference: Explanation ?Highest Education Level: High School ?Preferred Language: English ?Cognitive Barrier ?Language Barrier: No ?Translator Needed: No ?Memory Deficit: No ?Emotional Barrier: No ?Cultural/Religious Beliefs Affecting Medical Care: No ?Physical Barrier ?Impaired Vision: No ?Impaired Hearing: No ?Decreased Hand dexterity: No ?Knowledge/Comprehension ?Knowledge Level: Medium ?Comprehension Level: Medium ?Ability to understand written instructions: Medium ?Ability to understand verbal instructions: Medium ?Motivation ?Anxiety Level: Anxious ?Cooperation: Cooperative ?Education Importance: Acknowledges Need ?Interest in Health Problems: Asks Questions ?Perception: Coherent ?Willingness to Engage in Self-Management ?High ?Activities: ?Readiness to Engage in Self-Management ?High ?Activities: ?Electronic Signature(s) ?Signed: 01/27/2022 1:52:28 PM By: Sarah Coria RN ?Entered By: [name] on 01/26/2022 08:47:31 ?[name], [name] ([id number]) ?-------------------------------------------------------------------------------- ?Fall Risk Assessment Details ?[name] ?Date of Service: 01/26/2022 8:45 AM ?Medical Record Number: [id number] ?Patient Account Number: [id number] ?Date of Birth/Sex: 12-19-1933 (88 y.o. F) ?Treating RN:  [name] ?Primary Care [name]: [name] Other Clinician: ?Referring [name]: Referral, Self ?Treating [name]/Extender: [name] ?Weeks in Treatment: 0 ?Fall Risk Assessment Items ?Have you had 2 or more falls in the last 12 monthso 0 No ?Have you had any fall that resulted in injury in the last 12 monthso 0 No ?FALLS RISK SCREEN ?History of falling - immediate or within 3 months 0 No ?Secondary diagnosis (Do you have 2 or more medical diagnoseso) 0 No ?Ambulatory aid ?None/bed rest/wheelchair/nurse 0 No ?Crutches/cane/walker 0 No ?Furniture 0 No ?Intravenous therapy Access/Saline/Heparin Lock 0 No ?Gait/Transferring ?Normal/ bed rest/ wheelchair 0 No ?Weak (  short steps with or without shuffle, stooped but able to lift head while walking, may ?0 No ?seek support from furniture) ?Impaired (short steps with shuffle, may have difficulty arising from chair, head down, impaired ?0 No ?balance) ?Mental Status ?Oriented to own ability 0 No ?Electronic Signature(s) ?Signed: 01/27/2022 1:52:28 PM By: Sarah Coria RN ?Entered By: [name] on 01/26/2022 08:47:42 ?[name], Sarah T. ([id number]) ?-------------------------------------------------------------------------------- ?Foot Assessment Details ?[name] ?Date of Service: 01/26/2022 8:45 AM ?Medical Record Number: [id number] ?Patient Account Number: [id number] ?Date of Birth/Sex: 1934/04/25 (88 y.o. F) ?Treating RN: [name] ?Primary Care [name]: [name] Other Clinician: ?Referring [name]: Referral, Self ?Treating [name]/Extender: [name] ?Weeks in Treatment: 0 ?Foot Assessment Items ?Site Locations ?+ = Sensation present, - = Sensation absent, C = Callus, U = Ulcer ?R = Redness, W = Warmth, M = Maceration, PU = Pre-ulcerative lesion ?F = Fissure, S = Swelling, D = Dryness ?Assessment ?Right: Left: ?Other Deformity: No No ?Prior Foot Ulcer: No No ?Prior Amputation: No No ?Charcot Joint: No  No ?Ambulatory Status: Ambulatory With Help ?Assistance Device: Walker ?Gait: Steady ?Electronic Signature(s) ?Signed: 01/27/2022 1:52:28 PM By: Sarah Coria RN ?Entered By: [name] on 01/26/2022 08:55:44 ?[name], Sarah T. ([id number]) ?-------------------------------------------------------------------------------- ?Nutrition Risk Screening Details ?[name] ?Date of Service: 01/26/2022 8:45 AM ?Medical Record Number: [id number] ?Patient Account Number: [id number] ?Date of Birth/Sex: 16-Dec-1933 (88 y.o. F) ?Treating RN: [name] ?Primary Care Primitivo Merkey: [name] Other Clinician: ?Referring Trinda Harlacher: Referral, Self ?Treating Shahram Alexopoulos/Extender: [name] ?Weeks in Treatment: 0 ?Height (in): 64 ?Weight (lbs): 160 ?Body Mass Index (BMI): 27.5 ?Nutrition Risk Screening Items ?Score Screening ?NUTRITION RISK SCREEN: ?I have an illness or condition that made me change the kind and/or amount of food I eat 0 No ?I eat fewer than two meals per day 0 No ?I eat few fruits and vegetables, or milk products 0 No ?I have three or more drinks of beer, liquor or wine almost every day 0 No ?I have tooth or mouth problems that make it hard for me to eat 0 No ?I don't always have enough money to buy the food I need 0 No ?I eat alone most of the time 0 No ?I take three or more different prescribed or over-the-counter drugs a day 1 Yes ?Without wanting to, I have lost or gained 10 pounds in the last six months 0 No ?I am not always physically able to shop, cook and/or feed myself 0 No ?Nutrition Protocols ?Good Risk Protocol 0 No interventions needed ?Moderate Risk Protocol ?High Risk Proctocol ?Risk Level: Good Risk ?Score: 1 ?Electronic Signature(s) ?Signed: 01/27/2022 1:52:28 PM By: Sarah Coria RN ?Entered By: [name] on 01/26/2022 08:47:56 ?

## 2022-01-27 NOTE — Progress Notes (Signed)
ROSALIN, BUSTER TMarland Kitchen ([id number]) ?Visit Report for 01/26/2022 ?Chief Complaint Document Details ?[name], [name] ?Date of Service: 01/26/2022 8:45 AM ?Medical Record Number: [id number] ?Patient Account Number: [id number] ?Date of Birth/Sex: Dec 19, 1933 (87 y.o. F) ?Treating RN: Carlene Coria ?Primary Care Provider: Fulton Reek Other Clinician: ?Referring Provider: Referral, Self ?Treating Provider/Extender: Ricard Dillon ?Weeks in Treatment: 0 ?Information Obtained from: Patient ?Chief Complaint ?07/14/2020; patient sent over here for a hemorrhagic blister on the right dorsal foot by her primary doctor Dr. Doy Hutching at [facility]. ?01/26/2022; patient referred from nephrology at dialysis for review of a wound on the left dorsal foot distally ?Electronic Signature(s) ?Signed: 01/26/2022 4:18:52 PM By: Linton Ham MD ?Entered By: Linton Ham on 01/26/2022 09:19:46 ?ASA, FATH TMarland Kitchen ([id number]) ?-------------------------------------------------------------------------------- ?HPI Details ?Patient Name: Weiss, Sarah ?Date of Service: 01/26/2022 8:45 AM ?Medical Record Number: [id number] ?Patient Account Number: [id number] ?Date of Birth/Sex: 1933/11/14 (87 y.o. F) ?Treating RN: Carlene Coria ?Primary Care Provider: Fulton Reek Other Clinician: ?Referring Provider: Referral, Self ?Treating Provider/Extender: Ricard Dillon ?Weeks in Treatment: 0 ?History of Present Illness ?HPI Description: ADMISSION ?07/14/2020. This is a medically complex 89 year old woman who is sent over urgently from her primary care doctor Dr. Doy Hutching at [facility] ?and we worked her in this afternoon. She is apparently developed a blister on the dorsal foot sometime over the last 2 weeks on the right which ?seems to have developed a hemorrhagic component to this. The history here is somewhat vague but this is happened since she left the hospital ?on 07/02/2020. She also has a blistered area on the left  dorsal foot although this does not have a hemorrhagic component to it and it is not ?threatened to open where is the right side is definitely leaking fluid and could easily break down. I do not believe she has been doing anything ?specific to this. ?With regards to her medical history I have reviewed the discharge summary from the hospital from 06/22/2020 through 07/02/2020. 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 She was given prednisone ?but was not felt to be a candidate for immunosuppression secondary to her age and comorbidities. She is ANA positive p-ANCA positive. She had a ?right IJ catheter placed on 9/24 and is being done on dialysis. She is a diabetic. She takes prednisone 20 mg twice daily. She was on aspirin and ?Plavix although I think these have been put on hold. ?Past medical history includes; pauci-immune crescentic glomerulonephritis. History of peripheral edema and type 2 diabetes left carotid artery ?stenosis with a history of a CVA, coronary artery disease and hypertension. She states that she tolerates dialysis reasonably poorly secondary to ?"butt cramps". She says that when she gets discomfort they stop her dialysis. This probably has something to do with her fluid overloaded state ?I cannot see any arterial evaluation in her record in [facility]. We could not do arterial ABIs on her because of complaints of discomfort ?10/20; 1 week follow-up. This is a patient who is a diabetic. She recently developed acute renal failure because of pauci-immune ?glomerulonephritis. Sometime during her initial hospitalization she developed a wound on her right foot tremendous swelling in both legs. She was ?sent over here urgently last week by her primary care physician with what looks to be hematoma still contained on the right dorsal foot. We put ?silver alginate on this and put her in compression to control  some of the swelling. The left  leg had a similar appearance with severe edema in the ?left dorsal foot so we put that under compression as well. When she arrived back for nurse follow-up on Friday there was some drainage coming ?out of the wound which our nurse cultured. This is come back showing Pseudomonas Enterococcus faecalis and staph aureus although the ?identification of the staph aureus is not complete. I empirically put her on cefdinir 300 mg after dialysis on Tuesday Thursday and Saturday she is ?only had one dose. She has not been systemically unwell ?10/27 1 week follow-up. With considerable help of her nursing staff I was finally able to speak to her nephrologist who manages her at dialysis. ?Started her on vancomycin and ceftazidime. This to cover the combination of methicillin sensitive staph aureus Enterococcus faecalis and ?Pseudomonas. Although the staph aureus was methicillin sensitive and the Enterococcus ampicillin sensitive vancomycin provided the best ?alternative here that can be given at dialysis and ceftazidime to cover the Pseudomonas. She had a traumatic wound on the top of her foot and a ?complicated abscess. She has significant tissue destruction from this although the wound is looking a lot better today. We are using silver alginate ?to the surface. X-ray of the foot did not show any deep obvious infection or osteomyelitis ?12/1; since the patient was last here she was hospitalized from 11/3 through 11/10 with coag negative staphylococcal bacteremia. I do not know ?the the exact cause of this was determined. She was seen by vascular and they replaced her permacath on the right side of her neck. Her repeat ?blood cultures were negative. She is still on IV vancomycin ando Ceftazidime at dialysis. She generally feels systemically well she does not have a ?fever she is still receiving dialysis but reports she voids up to 8 times a day ?She has home health changing the dressing I believe  they are using silver alginate under 3 layer compression. In addition to the large wound on ?her right anterior foot that we initially saw her for she has apparently a right lateral leg wound that she suffered during a fall and 2 skin tears on ?the left anterior lower leg ?12/8; patient comes in looking a lot better this week. One of the 2 skin tears she has on the left leg is healed the other is smaller. The area on the ?right lateral leg looks improved and her original wound on the right dorsal foot is a lot smaller and looks a lot better. We used Hydrofera Blue to ?all wound areas under 3 layer compression.. Apparently home health had some trouble with the compression wraps. ?12/15 patient comes in with all of her wounds smaller including her original deep punched out wound on the right dorsal foot as well as the skin ?tears on the right posterior lateral calf and the left anterior lower leg. We have been using Hydrofera Blue under compression excellent results ?10/12/2020 upon evaluation today patient appears to be doing decently well currently in regard to her foot ulcer. She does have a small hole still ?open with some tunneling that really goes proximally in the foot at around the 12:00 location. There is really not significant undermining other ?locations. This appears to be a small area of where there was a little fluid trapped. Nonetheless I do feel like this is still doing quite well and in ?general I think that she is going to benefit from possibly packing into this area with endoform in order to keep the area open while it granulates ?in. ?  11/09/2020 upon evaluation today patient appears to be doing really about the same in regard to her wound. There is a little reopening towards the ?distal portion of the wound bed but again I think this is just more as a result of the dressing possibly getting stuck even to little bit of the scar ?tissue nonetheless it also anything looks worse although I am not sure the  endoform is really doing the job for her. I think we may want to try ?something a little different here I think packing with a silver alginate dressing may be optimal as compared to what we tried at this poi

## 2022-02-07 ENCOUNTER — Encounter: Payer: Medicare Other | Attending: Physician Assistant | Admitting: Physician Assistant

## 2022-02-07 DIAGNOSIS — E1151 Type 2 diabetes mellitus with diabetic peripheral angiopathy without gangrene: Secondary | ICD-10-CM | POA: Diagnosis present

## 2022-02-07 DIAGNOSIS — I132 Hypertensive heart and chronic kidney disease with heart failure and with stage 5 chronic kidney disease, or end stage renal disease: Secondary | ICD-10-CM | POA: Diagnosis not present

## 2022-02-07 DIAGNOSIS — I5040 Unspecified combined systolic (congestive) and diastolic (congestive) heart failure: Secondary | ICD-10-CM | POA: Diagnosis not present

## 2022-02-07 DIAGNOSIS — N179 Acute kidney failure, unspecified: Secondary | ICD-10-CM | POA: Diagnosis not present

## 2022-02-07 DIAGNOSIS — Z992 Dependence on renal dialysis: Secondary | ICD-10-CM | POA: Insufficient documentation

## 2022-02-07 DIAGNOSIS — E11621 Type 2 diabetes mellitus with foot ulcer: Secondary | ICD-10-CM | POA: Diagnosis not present

## 2022-02-07 DIAGNOSIS — E1122 Type 2 diabetes mellitus with diabetic chronic kidney disease: Secondary | ICD-10-CM | POA: Diagnosis not present

## 2022-02-07 DIAGNOSIS — I2510 Atherosclerotic heart disease of native coronary artery without angina pectoris: Secondary | ICD-10-CM | POA: Insufficient documentation

## 2022-02-07 DIAGNOSIS — I6529 Occlusion and stenosis of unspecified carotid artery: Secondary | ICD-10-CM | POA: Insufficient documentation

## 2022-02-07 DIAGNOSIS — L97528 Non-pressure chronic ulcer of other part of left foot with other specified severity: Secondary | ICD-10-CM | POA: Insufficient documentation

## 2022-02-07 DIAGNOSIS — Z8673 Personal history of transient ischemic attack (TIA), and cerebral infarction without residual deficits: Secondary | ICD-10-CM | POA: Diagnosis not present

## 2022-02-07 DIAGNOSIS — Z8619 Personal history of other infectious and parasitic diseases: Secondary | ICD-10-CM | POA: Diagnosis not present

## 2022-02-07 DIAGNOSIS — N186 End stage renal disease: Secondary | ICD-10-CM | POA: Diagnosis not present

## 2022-02-07 DIAGNOSIS — J449 Chronic obstructive pulmonary disease, unspecified: Secondary | ICD-10-CM | POA: Diagnosis not present

## 2022-02-07 NOTE — Progress Notes (Signed)
JAYCIE, KREGEL Weiss Kitchen ([id number]) ?Visit Report for 02/07/2022 ?Arrival Information Details ?[name], [name] ?Date of Service: 02/07/2022 9:00 AM ?Medical Record Number: [id number] ?Patient Account Number: [id number] ?Date of Birth/Sex: Jun 05, 1934 (88 y.o. F) ?Treating RN: Levora Dredge ?Primary Care Kapri Nero: Fulton Reek Other Clinician: ?Referring Iasiah Ozment: Fulton Reek ?Treating Lena Fieldhouse/Extender: Jeri Cos ?Weeks in Treatment: 1 ?Visit Information History Since Last Visit ?Added or deleted any medications: No ?Patient Arrived: Wheel Chair ?Any new allergies or adverse reactions: No ?Arrival Time: 09:00 ?Had a fall or experienced change in No ?Accompanied By: husband ?activities of daily living that may affect ?Transfer Assistance: Easy[employer] Patient Lift ?risk of falls: ?Patient Identification Verified: Yes ?Hospitalized since last visit: No ?Secondary Verification Process Completed: Yes ?Has Dressing in Place as Prescribed: Yes ?Patient Requires Transmission-Based No ?Pain Present Now: No ?Precautions: ?Patient Has Alerts: Yes ?Patient Alerts: Patient on Blood ?Thinner ?Electronic Signature(s) ?Signed: 02/07/2022 4:57:35 PM By: Levora Dredge ?Entered By: Levora Dredge on 02/07/2022 09:01:22 ?[name], Sarah T. ([id number]) ?-------------------------------------------------------------------------------- ?Clinic Level of Care Assessment Details ?[name], [name] ?Date of Service: 02/07/2022 9:00 AM ?Medical Record Number: [id number] ?Patient Account Number: [id number] ?Date of Birth/Sex: 16-Mar-1934 (88 y.o. F) ?Treating RN: Levora Dredge ?Primary Care Nia Nathaniel: Fulton Reek Other Clinician: ?Referring Sibley Rolison: Fulton Reek ?Treating Jenelle Drennon/Extender: Jeri Cos ?Weeks in Treatment: 1 ?Clinic Level of Care Assessment Items ?TOOL 4 Quantity Score ?'[]'$  - Use when only an EandM is performed on FOLLOW-UP visit 0 ?ASSESSMENTS - Nursing Assessment / Reassessment ?X -  Reassessment of Co-morbidities (includes updates in patient status) 1 10 ?X- 1 5 ?Reassessment of Adherence to Treatment Plan ?ASSESSMENTS - Wound and Skin Assessment / Reassessment ?'[]'$  - Simple Wound Assessment / Reassessment - one wound 0 ?X- 2 5 ?Complex Wound Assessment / Reassessment - multiple wounds ?'[]'$  - 0 ?Dermatologic / Skin Assessment (not related to wound area) ?ASSESSMENTS - Focused Assessment ?X - Circumferential Edema Measurements - multi extremities 1 5 ?'[]'$  - 0 ?Nutritional Assessment / Counseling / Intervention ?'[]'$  - 0 ?Lower Extremity Assessment (monofilament, tuning fork, pulses) ?'[]'$  - 0 ?Peripheral Arterial Disease Assessment (using hand held doppler) ?ASSESSMENTS - Ostomy and/or Continence Assessment and Care ?'[]'$  - Incontinence Assessment and Management 0 ?'[]'$  - 0 ?Ostomy Care Assessment and Management (repouching, etc.) ?PROCESS - Coordination of Care ?X - Simple Patient / Family Education for ongoing care 1 15 ?'[]'$  - 0 ?Complex (extensive) Patient / Family Education for ongoing care ?'[]'$  - 0 ?Staff obtains Consents, Records, Test Results / Process Orders ?'[]'$  - 0 ?Staff telephones HHA, Nursing Homes / Clarify orders / etc ?'[]'$  - 0 ?Routine Transfer to another Facility (non-emergent condition) ?'[]'$  - 0 ?Routine Hospital Admission (non-emergent condition) ?'[]'$  - 0 ?New Admissions / Biomedical engineer / Ordering NPWT, Apligraf, etc. ?'[]'$  - 0 ?Emergency Hospital Admission (emergent condition) ?X- 1 10 ?Simple Discharge Coordination ?'[]'$  - 0 ?Complex (extensive) Discharge Coordination ?PROCESS - Special Needs ?'[]'$  - Pediatric / Minor Patient Management 0 ?'[]'$  - 0 ?Isolation Patient Management ?'[]'$  - 0 ?Hearing / Language / Visual special needs ?'[]'$  - 0 ?Assessment of Community assistance (transportation, D/C planning, etc.) ?'[]'$  - 0 ?Additional assistance / Altered mentation ?'[]'$  - 0 ?Support Surface(s) Assessment (bed, cushion, seat, etc.) ?INTERVENTIONS - Wound Cleansing / Measurement ?[name], [name] Kitchen  ([id number]) ?'[]'$  - 0 ?Simple Wound Cleansing - one wound ?X- 2 5 ?Complex Wound Cleansing - multiple wounds ?X- 1 5 ?Wound Imaging (photographs - any number of wounds) ?'[]'$  - 0 ?Wound Tracing (instead of  photographs) ?'[]'$  - 0 ?Simple Wound Measurement - one wound ?X- 2 5 ?Complex Wound Measurement - multiple wounds ?INTERVENTIONS - Wound Dressings ?X - Small Wound Dressing one or multiple wounds 2 10 ?'[]'$  - 0 ?Medium Wound Dressing one or multiple wounds ?'[]'$  - 0 ?Large Wound Dressing one or multiple wounds ?X- 1 5 ?Application of Medications - topical ?'[]'$  - 0 ?Application of Medications - injection ?INTERVENTIONS - Miscellaneous ?'[]'$  - External ear exam 0 ?'[]'$  - 0 ?Specimen Collection (cultures, biopsies, blood, body fluids, etc.) ?'[]'$  - 0 ?Specimen(s) / Culture(s) sent or taken to Lab for analysis ?'[]'$  - 0 ?Patient Transfer (multiple staff / Civil Service fast streamer / Similar devices) ?'[]'$  - 0 ?Simple Staple / Suture removal (25 or less) ?'[]'$  - 0 ?Complex Staple / Suture removal (26 or more) ?'[]'$  - 0 ?Hypo / Hyperglycemic Management (close monitor of Blood Glucose) ?'[]'$  - 0 ?Ankle / Brachial Index (ABI) - do not check if billed separately ?X- 1 5 ?Vital Signs ?Has the patient been seen at the hospital within the last three years: Yes ?Total Score: 110 ?Level Of Care: New/Established - Level ?3 ?Electronic Signature(s) ?Signed: 02/07/2022 4:57:35 PM By: Levora Dredge ?Entered By: Levora Dredge on 02/07/2022 10:04:37 ?[name], Sarah T. ([id number]) ?-------------------------------------------------------------------------------- ?Encounter Discharge Information Details ?[name], [name] ?Date of Service: 02/07/2022 9:00 AM ?Medical Record Number: [id number] ?Patient Account Number: [id number] ?Date of Birth/Sex: [birth year] (88 y.o. F) ?Treating RN: Levora Dredge ?Primary Care Angeliyah Kirkey: Fulton Reek Other Clinician: ?Referring Teliyah Royal: Fulton Reek ?Treating Chue Berkovich/Extender: Jeri Cos ?Weeks in Treatment:  1 ?Encounter Discharge Information Items ?Discharge Condition: Stable ?Ambulatory Status: Wheelchair ?Discharge Destination: Home ?Transportation: Private Auto ?Accompanied By: husband ?Schedule Follow-up Appointment: Yes ?Clinical Summary of Care: Patient Declined ?Notes ?Education on dressing change to right lower extremity provided to patient and husband, patient given enough supplies for dressing changes until ?back in clinic. Husband advised to please call clinic with any questions or issues. ?Electronic Signature(s) ?Signed: 02/07/2022 4:57:35 PM By: Levora Dredge ?Entered By: Levora Dredge on 02/07/2022 10:06:26 ?[name], Sarah T. ([id number]) ?-------------------------------------------------------------------------------- ?Lower Extremity Assessment Details ?[name], [name] ?Date of Service: 02/07/2022 9:00 AM ?Medical Record Number: [id number] ?Patient Account Number: [id number] ?Date of Birth/Sex: 24-Aug-1934 (87 y.o. F) ?Treating RN: Levora Dredge ?Primary Care Natesha Hassey: Fulton Reek Other Clinician: ?Referring Bob Daversa: Fulton Reek ?Treating Elijiah Mickley/Extender: Jeri Cos ?Weeks in Treatment: 1 ?Edema Assessment ?Assessed: [Left: No] [Right: No] ?Edema: [Left: Ye] [Right: s] ?Calf ?Left: Right: ?Point of Measurement: 30 cm From Medial Instep 33 cm ?Ankle ?Left: Right: ?Point of Measurement: 10 cm From Medial Instep 28.1 cm ?Vascular Assessment ?Pulses: ?Dorsalis Pedis ?Palpable: [Left:Yes] ?Notes ?pedal pulse slight ?Electronic Signature(s) ?Signed: 02/07/2022 4:57:35 PM By: Levora Dredge ?Entered By: Levora Dredge on 02/07/2022 09:13:43 ?[name], Sarah T. ([id number]) ?-------------------------------------------------------------------------------- ?Multi Wound Chart Details ?[name], [name] ?Date of Service: 02/07/2022 9:00 AM ?Medical Record Number: [id number] ?Patient Account Number: [id number] ?Date of Birth/Sex: May 09, 1934 (87 y.o. F) ?Treating RN: Levora Dredge ?Primary Care Jakayla Schweppe: Fulton Reek Other Clinician: ?Referring Carder Yin: Fulton Reek ?Treating Cherilyn Sautter/Extender: Jeri Cos ?Weeks in Treatment: 1 ?Vital Signs ?Height(in): 64 ?Pulse(bpm): 75 ?Weight(

## 2022-02-07 NOTE — Progress Notes (Addendum)
[name] TMarland Kitchen ([id number]) ?Visit Report for 02/07/2022 ?Chief Complaint Document Details ?[name], [name] ?Date of Service: 02/07/2022 9:00 AM ?Medical Record Number: [id number] ?Patient Account Number: [id number] ?Date of Birth/Sex: 01-06-1934 (88 y.o. F) ?Treating RN: Carlene Coria ?Primary Care Provider: Fulton Reek Other Clinician: ?Referring Provider: Fulton Reek ?Treating Provider/Extender: Jeri Cos ?Weeks in Treatment: 1 ?Information Obtained from: Patient ?Chief Complaint ?07/14/2020; patient sent over here for a hemorrhagic blister on the right dorsal foot by her primary doctor Dr. Doy Hutching at [facility]. ?01/26/2022; patient referred from nephrology at dialysis for review of a wound on the left dorsal foot distally ?Electronic Signature(s) ?Signed: 02/07/2022 9:09:11 AM By: Worthy Keeler PA-C ?Entered By: Worthy Keeler on 02/07/2022 09:09:11 ?[name] TMarland Kitchen ([id number]) ?-------------------------------------------------------------------------------- ?HPI Details ?[name], [name] ?Date of Service: 02/07/2022 9:00 AM ?Medical Record Number: [id number] ?Patient Account Number: [id number] ?Date of Birth/Sex: Feb 14, 1934 (88 y.o. F) ?Treating RN: Carlene Coria ?Primary Care Provider: Fulton Reek Other Clinician: ?Referring Provider: Fulton Reek ?Treating Provider/Extender: Jeri Cos ?Weeks in Treatment: 1 ?History of Present Illness ?HPI Description: ADMISSION ?07/14/2020. This is a medically complex 88 year old woman who is sent over urgently from her primary care doctor Dr. Doy Hutching at [facility] ?and we worked her in this afternoon. She is apparently developed a blister on the dorsal foot sometime over the last 2 weeks on the right which ?seems to have developed a hemorrhagic component to this. The history here is somewhat vague but this is happened since she left the hospital ?on 07/02/2020. She also has a blistered area on the left dorsal foot  although this does not have a hemorrhagic component to it and it is not ?threatened to open where is the right side is definitely leaking fluid and could easily break down. I do not believe she has been doing anything ?specific to this. ?With regards to her medical history I have reviewed the discharge summary from the hospital from 06/22/2020 through 07/02/2020. 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 She was given prednisone ?but was not felt to be a candidate for immunosuppression secondary to her age and comorbidities. She is ANA positive p-ANCA positive. She had a ?right IJ catheter placed on 9/24 and is being done on dialysis. She is a diabetic. She takes prednisone 20 mg twice daily. She was on aspirin and ?Plavix although I think these have been put on hold. ?Past medical history includes; pauci-immune crescentic glomerulonephritis. History of peripheral edema and type 2 diabetes left carotid artery ?stenosis with a history of a CVA, coronary artery disease and hypertension. She states that she tolerates dialysis reasonably poorly secondary to ?"butt cramps". She says that when she gets discomfort they stop her dialysis. This probably has something to do with her fluid overloaded state ?I cannot see any arterial evaluation in her record in [facility]. We could not do arterial ABIs on her because of complaints of discomfort ?10/20; 1 week follow-up. This is a patient who is a diabetic. She recently developed acute renal failure because of pauci-immune ?glomerulonephritis. Sometime during her initial hospitalization she developed a wound on her right foot tremendous swelling in both legs. She was ?sent over here urgently last week by her primary care physician with what looks to be hematoma still contained on the right dorsal foot. We put ?silver alginate on this and put her in compression to control some of the  swelling. The left  leg had a similar appearance with severe edema in the ?left dorsal foot so we put that under compression as well. When she arrived back for nurse follow-up on Friday there was some drainage coming ?out of the wound which our nurse cultured. This is come back showing Pseudomonas Enterococcus faecalis and staph aureus although the ?identification of the staph aureus is not complete. I empirically put her on cefdinir 300 mg after dialysis on Tuesday Thursday and Saturday she is ?only had one dose. She has not been systemically unwell ?10/27 1 week follow-up. With considerable help of her nursing staff I was finally able to speak to her nephrologist who manages her at dialysis. ?Started her on vancomycin and ceftazidime. This to cover the combination of methicillin sensitive staph aureus Enterococcus faecalis and ?Pseudomonas. Although the staph aureus was methicillin sensitive and the Enterococcus ampicillin sensitive vancomycin provided the best ?alternative here that can be given at dialysis and ceftazidime to cover the Pseudomonas. She had a traumatic wound on the top of her foot and a ?complicated abscess. She has significant tissue destruction from this although the wound is looking a lot better today. We are using silver alginate ?to the surface. X-ray of the foot did not show any deep obvious infection or osteomyelitis ?12/1; since the patient was last here she was hospitalized from 11/3 through 11/10 with coag negative staphylococcal bacteremia. I do not know ?the the exact cause of this was determined. She was seen by vascular and they replaced her permacath on the right side of her neck. Her repeat ?blood cultures were negative. She is still on IV vancomycin ando Ceftazidime at dialysis. She generally feels systemically well she does not have a ?fever she is still receiving dialysis but reports she voids up to 8 times a day ?She has home health changing the dressing I believe they are using  silver alginate under 3 layer compression. In addition to the large wound on ?her right anterior foot that we initially saw her for she has apparently a right lateral leg wound that she suffered during a fall and 2 skin tears on ?the left anterior lower leg ?12/8; patient comes in looking a lot better this week. One of the 2 skin tears she has on the left leg is healed the other is smaller. The area on the ?right lateral leg looks improved and her original wound on the right dorsal foot is a lot smaller and looks a lot better. We used Hydrofera Blue to ?all wound areas under 3 layer compression.. Apparently home health had some trouble with the compression wraps. ?12/15 patient comes in with all of her wounds smaller including her original deep punched out wound on the right dorsal foot as well as the skin ?tears on the right posterior lateral calf and the left anterior lower leg. We have been using Hydrofera Blue under compression excellent results ?10/12/2020 upon evaluation today patient appears to be doing decently well currently in regard to her foot ulcer. She does have a small hole still ?open with some tunneling that really goes proximally in the foot at around the 12:00 location. There is really not significant undermining other ?locations. This appears to be a small area of where there was a little fluid trapped. Nonetheless I do feel like this is still doing quite well and in ?general I think that she is going to benefit from possibly packing into this area with endoform in order to keep the area open while it granulates ?in. ?  11/09/2020 upon evaluation today patient appears to be doing really about the same in regard to her wound. There is a little reopening towards the ?distal portion of the wound bed but again I think this is just more as a result of the dressing possibly getting stuck even to little bit of the scar ?tissue nonetheless it also anything looks worse although I am not sure the endoform is  really doing the job for her. I think we may want to try ?something a little different here I think packing with a silver alginate dressing may be optimal as compared to what we tried at this point. Is been

## 2022-02-13 ENCOUNTER — Other Ambulatory Visit (INDEPENDENT_AMBULATORY_CARE_PROVIDER_SITE_OTHER): Payer: Self-pay | Admitting: Vascular Surgery

## 2022-02-13 DIAGNOSIS — I739 Peripheral vascular disease, unspecified: Secondary | ICD-10-CM

## 2022-02-14 ENCOUNTER — Encounter: Payer: Medicare Other | Admitting: Physician Assistant

## 2022-02-14 ENCOUNTER — Ambulatory Visit (INDEPENDENT_AMBULATORY_CARE_PROVIDER_SITE_OTHER): Payer: Medicare Other | Admitting: Vascular Surgery

## 2022-02-14 ENCOUNTER — Encounter (INDEPENDENT_AMBULATORY_CARE_PROVIDER_SITE_OTHER): Payer: Self-pay | Admitting: Vascular Surgery

## 2022-02-14 ENCOUNTER — Ambulatory Visit (INDEPENDENT_AMBULATORY_CARE_PROVIDER_SITE_OTHER): Payer: Medicare Other

## 2022-02-14 VITALS — BP 161/67 | HR 71 | Resp 17 | Ht 64.0 in

## 2022-02-14 DIAGNOSIS — E782 Mixed hyperlipidemia: Secondary | ICD-10-CM

## 2022-02-14 DIAGNOSIS — I10 Essential (primary) hypertension: Secondary | ICD-10-CM | POA: Diagnosis not present

## 2022-02-14 DIAGNOSIS — N186 End stage renal disease: Secondary | ICD-10-CM

## 2022-02-14 DIAGNOSIS — I739 Peripheral vascular disease, unspecified: Secondary | ICD-10-CM

## 2022-02-14 DIAGNOSIS — Z992 Dependence on renal dialysis: Secondary | ICD-10-CM

## 2022-02-14 DIAGNOSIS — I6523 Occlusion and stenosis of bilateral carotid arteries: Secondary | ICD-10-CM

## 2022-02-14 DIAGNOSIS — E1151 Type 2 diabetes mellitus with diabetic peripheral angiopathy without gangrene: Secondary | ICD-10-CM | POA: Diagnosis not present

## 2022-02-14 DIAGNOSIS — E1159 Type 2 diabetes mellitus with other circulatory complications: Secondary | ICD-10-CM | POA: Diagnosis not present

## 2022-02-14 NOTE — Progress Notes (Signed)
? ? ?MRN : [id number] ? ?[name] is a 88 y.o. (08-01-1934) female who presents with chief complaint of No chief complaint on file. ?. ? ?History of Present Illness: Patient returns today in follow up of multiple vascular issues.  She is doing reasonably well at this point.  Her left arm AV fistula is working reasonably well for her dialysis ager issues lately.  Her leg swelling is under pretty good control she has been getting wraps from the wound care center for some shallow ulcerations on her leg earlier this year.  No fevers or chills.  She denies any focal neurologic symptoms.  Her carotid duplex today shows velocities in the 1 to 39% range bilaterally which is unchanged from her study last year. ?She is also followed for peripheral arterial disease with marked calcification of her vessels with preserved flow previously.  She does have ulcerations on the leg but they are healing well.  She is not really walking to have claudication at this point. ABIs today are elevated from medial calcification, but in the normal range with normal waveforms and normal digital pressures bilaterally. ? ?Current Outpatient Medications  ?Medication Sig Dispense Refill  ? acetaminophen (TYLENOL) 325 MG tablet Take 2 tablets (650 mg total) by mouth every 6 (six) hours as needed for headache.    ? ALPRAZolam (XANAX) 0.5 MG tablet Take 1 tablet (0.5 mg total) by mouth 2 (two) times daily as needed for anxiety. 10 tablet 0  ? calcium-vitamin D (OSCAL WITH D) 500-200 MG-UNIT tablet Take 1 tablet by mouth daily.    ? clopidogrel (PLAVIX) 75 MG tablet Take 75 mg by mouth daily.    ? Cyanocobalamin 1000 MCG SUBL Take 1,000 mcg by mouth daily.     ? famotidine (PEPCID) 10 MG tablet Take 1 tablet (10 mg total) by mouth daily.    ? gabapentin (NEURONTIN) 100 MG capsule Take 1 capsule by mouth 2 (two) times daily.    ? HYDROcodone-acetaminophen (NORCO/VICODIN) 5-325 MG tablet Take 1 tablet by mouth every 6 (six) hours as needed for  moderate pain. 10 tablet 0  ? levothyroxine (SYNTHROID) 150 MCG tablet Take by mouth.    ? ondansetron (ZOFRAN ODT) 4 MG disintegrating tablet Take 1 tablet (4 mg total) by mouth every 8 (eight) hours as needed for nausea or vomiting. 20 tablet 0  ? OXYGEN Inhale 2 L into the lungs daily.    ? torsemide (DEMADEX) 100 MG tablet Take 100 mg by mouth 3 (three) times a week. Take 1 tablet three times a week on non-dialysis days.    ? aspirin 81 MG chewable tablet Chew 81 mg by mouth every Monday, Wednesday, and Friday.  (Patient not taking: Reported on 02/14/2022)    ? metoprolol tartrate (LOPRESSOR) 25 MG tablet Take 0.5 tablets (12.5 mg total) by mouth 2 (two) times daily. 90 tablet 3  ? ?No current facility-administered medications for this visit.  ? ? ?Past Medical History:  ?Diagnosis Date  ? Arthritis   ? Bilateral Macular degeneration   ? Breast cancer ([place]) 2012  ? right breast  ? Breast mass, right   ? CAD S/P CABG x 4   ? a. 09/2012 CABG x 4: LIMA to LAD, SVG to Diag, SVG to OM1, SVG to PDA, EVH from bilateral thighs  ? Cancer [facility])   ? breast cancer, right side 2012  ? Complication of anesthesia   ? Coronary artery disease 08/22/2012  ? sees Dr Rockey Situ  ?  Diabetes ([place])   ? Diastolic dysfunction   ? a. 06/2020 Echo: EF 60-65%; b. 08/2020 Echo: EF 60-65%, no rwma, Gr1 DD. Nl RV size/fxn.  ? DOE (dyspnea on exertion)   ? ESRD (end stage renal disease) ([place])   ? Gastritis   ? hx of  ? GERD (gastroesophageal reflux disease)   ? Headache(784.0)   ? migraines  ? History of breast cancer   ? 39 treatments of radiation. Negative chemo.  ? History of seasonal allergies   ? Hyperlipemia   ? Hypertension   ? sees Dr. Fulton Reek  ? Hypothyroidism   ? Neuropathy   ? Personal history of radiation therapy   ? Pneumonia   ? hx of  ? PONV (postoperative nausea and vomiting)   ? Stroke [facility])   ? 2012  ? Vertigo   ? ? ?Past Surgical History:  ?Procedure Laterality Date  ? A/V FISTULAGRAM Left 01/13/2021  ? Procedure: A/V  FISTULAGRAM;  Surgeon: Algernon Huxley, MD;  Location: [place] [facility];  Service: Cardiovascular;  Laterality: Left;  ? A/V SHUNT INTERVENTION Left 05/23/2021  ? Procedure: A/V SHUNT INTERVENTION;  Surgeon: Algernon Huxley, MD;  Location: [place] [facility];  Service: Cardiovascular;  Laterality: Left;  ? ABDOMINAL HYSTERECTOMY    ? APPENDECTOMY    ? AV FISTULA PLACEMENT Left 11/04/2020  ? Procedure: ARTERIOVENOUS (AV) FISTULA CREATION (BRACHIOCEPHALIC);  Surgeon: Algernon Huxley, MD;  Location: [facility];  Service: Vascular;  Laterality: Left;  ? BACK SURGERY    ? BREAST BIOPSY Right   ? 2012 positive- [facility]  ? BREAST LUMPECTOMY Right 2012  ? F/U radiation   ? BREAST SURGERY    ? CARDIAC CATHETERIZATION  2014  ? CAROTID PTA/STENT INTERVENTION Left 06/14/2020  ? Procedure: CAROTID PTA/STENT INTERVENTION;  Surgeon: Algernon Huxley, MD;  Location: [place] [facility];  Service: Cardiovascular;  Laterality: Left;  ? CATARACT EXTRACTION W/PHACO Right 08/14/2017  ? Procedure: CATARACT EXTRACTION PHACO AND INTRAOCULAR LENS PLACEMENT (IOC);  Surgeon: Birder Robson, MD;  Location: [facility];  Service: Ophthalmology;  Laterality: Right;  [place] 00:43.0 ?AP% 17.1 ?CDE 7.37 ?Fluid Pack lot # [id number]  ? CATARACT EXTRACTION W/PHACO Left 10/09/2017  ? Procedure: CATARACT EXTRACTION PHACO AND INTRAOCULAR LENS PLACEMENT (IOC);  Surgeon: Birder Robson, MD;  Location: [facility];  Service: Ophthalmology;  Laterality: Left;  [place] 00:30.3 ?AP% 11.0 ?CDE 3.33 ?Fluid Pack Lot # [id number]  ? CORONARY ARTERY BYPASS GRAFT  09/05/2012  ? Procedure: CORONARY ARTERY BYPASS GRAFTING (CABG);  Surgeon: Rexene Alberts, MD;  Location: [place];  Service: Open Heart Surgery;  Laterality: N/A;  CABG x four, using left internal mammary artery and bilateral greater saphenous vein harvested endoscopically  ? DIALYSIS/PERMA CATHETER INSERTION Left 06/25/2020  ? Procedure: DIALYSIS/PERMA CATHETER INSERTION;  Surgeon: Algernon Huxley, MD;  Location: [place] [facility];   Service: Cardiovascular;  Laterality: Left;  ? DIALYSIS/PERMA CATHETER INSERTION N/A 08/10/2020  ? Procedure: DIALYSIS/PERMA CATHETER INSERTION;  Surgeon: Katha Cabal, MD;  Location: [place] [facility];  Service: Cardiovascular;  Laterality: N/A;  ? DIALYSIS/PERMA CATHETER INSERTION N/A 05/23/2021  ? Procedure: DIALYSIS/PERMA CATHETER INSERTION;  Surgeon: Algernon Huxley, MD;  Location: [place] [facility];  Service: Cardiovascular;  Laterality: N/A;  ? DIALYSIS/PERMA CATHETER REMOVAL N/A 08/05/2020  ? Procedure: DIALYSIS/PERMA CATHETER REMOVAL;  Surgeon: Algernon Huxley, MD;  Location: [place] [facility];  Service: Cardiovascular;  Laterality: N/A;  ? INTRAOPERATIVE TRANSESOPHAGEAL ECHOCARDIOGRAM  09/05/2012  ? Procedure: INTRAOPERATIVE TRANSESOPHAGEAL ECHOCARDIOGRAM;  Surgeon: Rexene Alberts, MD;  Location: [place];  Service: Open Heart Surgery;  Laterality: N/A;  ? KNEE SURGERY    ? Bilateral nerve block  ? LAPAROSCOPIC NISSEN FUNDOPLICATION    ? OVARIAN CYST REMOVAL    ? ? ? ?Social History  ? ?Tobacco Use  ? Smoking status: Former  ?  Years: 3.00  ?  Types: Cigarettes  ?  Quit date: 11/13/1988  ?  Years since quitting: 33.2  ? Smokeless tobacco: Never  ? Tobacco comments:  ?  At the most 1 pack would last over a week.  ?Vaping Use  ? Vaping Use: Never used  ?Substance Use Topics  ? Alcohol use: No  ? Drug use: No  ? ? ? ? ?Family History  ?Problem Relation Age of Onset  ? Heart disease Brother   ?     CABG & stents  ? Hyperlipidemia Brother   ? Hypertension Brother   ? Cancer Father   ? Heart disease Mother   ? Breast cancer Cousin   ? Kidney disease Neg Hx   ? ? ?Allergies  ?Allergen Reactions  ? Clonidine Other (See Comments) and Shortness Of Breath  ?  Other reaction(s): Other (see comments) ?Other reaction(s): Other (See Comments)  ? Darvocet [Propoxyphene N-Acetaminophen] Anaphylaxis  ? Hydralazine   ?  Other reaction(s): Other (See Comments) ?glomerulonephritis  ? Hydralazine Hcl Other (See Comments)  ?   glomerulonephritis  ? Metoprolol Shortness Of Breath  ?  Pt reports "very winded, couldn't catch breath"   ? Esomeprazole   ?  Other reaction(s): Unknown  ? Guaifenesin   ?  Other reaction(s): Unknown ?Other react

## 2022-02-14 NOTE — Assessment & Plan Note (Signed)
lipid control important in reducing the progression of atherosclerotic disease. Continue statin therapy  

## 2022-02-14 NOTE — Assessment & Plan Note (Signed)
blood glucose control important in reducing the progression of atherosclerotic disease. Also, involved in wound healing. On appropriate medications.  

## 2022-02-14 NOTE — Progress Notes (Addendum)
[name] ([id number]) ?Visit Report for 02/14/2022 ?Chief Complaint Document Details ?[name], DOBOSZ ?Date of Service: 02/14/2022 12:45 PM ?Medical Record Number: [id number] ?Patient Account Number: [id number] ?Date of Birth/Sex: 07-Mar-1934 (88 y.o. F) ?Treating RN: Levora Dredge ?Primary Care Provider: Fulton Reek Other Clinician: ?Referring Provider: Fulton Reek ?Treating Provider/Extender: Jeri Cos ?Weeks in Treatment: 2 ?Information Obtained from: Patient ?Chief Complaint ?07/14/2020; patient sent over here for a hemorrhagic blister on the right dorsal foot by her primary doctor Dr. Doy Hutching at [facility]. ?01/26/2022; patient referred from nephrology at dialysis for review of a wound on the left dorsal foot distally ?Electronic Signature(s) ?Signed: 02/14/2022 1:16:44 PM By: Worthy Keeler PA-C ?Previous Signature: 02/14/2022 1:16:38 PM Version By: Worthy Keeler PA-C ?Entered By: Worthy Keeler on 02/14/2022 13:16:44 ?PERSIA, LINTNER T. ([id number]) ?-------------------------------------------------------------------------------- ?Debridement Details ?[name] ?Date of Service: 02/14/2022 12:45 PM ?Medical Record Number: [id number] ?Patient Account Number: [id number] ?Date of Birth/Sex: 1934/07/07 (88 y.o. F) ?Treating RN: Levora Dredge ?Primary Care Provider: Fulton Reek Other Clinician: ?Referring Provider: Fulton Reek ?Treating Provider/Extender: Jeri Cos ?Weeks in Treatment: 2 ?Debridement Performed for ?Wound #5 Left,Dorsal Foot ?Assessment: ?Performed By: Physician Tommie Sams., PA-C ?Debridement Type: Debridement ?Severity of Tissue Pre Debridement: Fat layer exposed ?Level of Consciousness (Pre- ?Awake and Alert ?procedure): ?Pre-procedure Verification/Time Out ?Yes - 13:25 ?Taken: ?Total Area Debrided (L x W): 0.6 (cm) x 0.8 (cm) = 0.48 (cm?) ?Tissue and other material ?Viable, Non-Viable, Slough, Subcutaneous,  Brandon ?debrided: ?Level: Skin/Subcutaneous Tissue ?Debridement Description: Excisional ?Instrument: Curette ?Bleeding: Minimum ?Hemostasis Achieved: Pressure ?Response to Treatment: Procedure was tolerated well ?Level of Consciousness (Post- ?Awake and Alert ?procedure): ?Post Debridement Measurements of Total Wound ?Length: (cm) 0.6 ?Width: (cm) 0.8 ?Depth: (cm) 0.1 ?Volume: (cm?) 0.038 ?Character of Wound/Ulcer Post Debridement: Stable ?Severity of Tissue Post Debridement: Fat layer exposed ?Post Procedure Diagnosis ?Same as Pre-procedure ?Electronic Signature(s) ?Signed: 02/14/2022 4:14:45 PM By: Levora Dredge ?Signed: 02/14/2022 4:58:37 PM By: Worthy Keeler PA-C ?Entered By: Levora Dredge on 02/14/2022 13:30:45 ?MOSELLE, RISTER TMarland Weiss ([id number]) ?-------------------------------------------------------------------------------- ?HPI Details ?[name] ?Date of Service: 02/14/2022 12:45 PM ?Medical Record Number: [id number] ?Patient Account Number: [id number] ?Date of Birth/Sex: 11-Nov-1933 (88 y.o. F) ?Treating RN: Levora Dredge ?Primary Care Provider: Fulton Reek Other Clinician: ?Referring Provider: Fulton Reek ?Treating Provider/Extender: Jeri Cos ?Weeks in Treatment: 2 ?History of Present Illness ?HPI Description: ADMISSION ?07/14/2020. This is a medically complex 88 year old woman who is sent over urgently from her primary care doctor Dr. Doy Hutching at [facility] ?and we worked her in this afternoon. She is apparently developed a blister on the dorsal foot sometime over the last 2 weeks on the right which ?seems to have developed a hemorrhagic component to this. The history here is somewhat vague but this is happened since she left the hospital ?on 07/02/2020. She also has a blistered area on the left dorsal foot although this does not have a hemorrhagic component to it and it is not ?threatened to open where is the right side is definitely leaking fluid and could easily  break down. I do not believe she has been doing anything ?specific to this. ?With regards to her medical history I have reviewed the discharge summary from the hospital from 06/22/2020 through 07/02/2020. 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 She was given prednisone ?but was not felt to be a candidate for immunosuppression secondary to her age and  comorbidities. She is ANA positive p-ANCA positive. She had a ?right IJ catheter placed on 9/24 and is being done on dialysis. She is a diabetic. She takes prednisone 20 mg twice daily. She was on aspirin and ?Plavix although I think these have been put on hold. ?Past medical history includes; pauci-immune crescentic glomerulonephritis. History of peripheral edema and type 2 diabetes left carotid artery ?stenosis with a history of a CVA, coronary artery disease and hypertension. She states that she tolerates dialysis reasonably poorly secondary to ?"butt cramps". She says that when she gets discomfort they stop her dialysis. This probably has something to do with her fluid overloaded state ?I cannot see any arterial evaluation in her record in [facility]. We could not do arterial ABIs on her because of complaints of discomfort ?10/20; 1 week follow-up. This is a patient who is a diabetic. She recently developed acute renal failure because of pauci-immune ?glomerulonephritis. Sometime during her initial hospitalization she developed a wound on her right foot tremendous swelling in both legs. She was ?sent over here urgently last week by her primary care physician with what looks to be hematoma still contained on the right dorsal foot. We put ?silver alginate on this and put her in compression to control some of the swelling. The left leg had a similar appearance with severe edema in the ?left dorsal foot so we put that under compression as well. When she arrived back for  nurse follow-up on Friday there was some drainage coming ?out of the wound which our nurse cultured. This is come back showing Pseudomonas Enterococcus faecalis and staph aureus although the ?identification of the staph aureus is not complete. I empirically put her on cefdinir 300 mg after dialysis on Tuesday Thursday and Saturday she is ?only had one dose. She has not been systemically unwell ?10/27 1 week follow-up. With considerable help of her nursing staff I was finally able to speak to her nephrologist who manages her at dialysis. ?Started her on vancomycin and ceftazidime. This to cover the combination of methicillin sensitive staph aureus Enterococcus faecalis and ?Pseudomonas. Although the staph aureus was methicillin sensitive and the Enterococcus ampicillin sensitive vancomycin provided the best ?alternative here that can be given at dialysis and ceftazidime to cover the Pseudomonas. She had a traumatic wound on the top of her foot and a ?complicated abscess. She has significant tissue destruction from this although the wound is looking a lot better today. We are using silver alginate ?to the surface. X-ray of the foot did not show any deep obvious infection or osteomyelitis ?12/1; since the patient was last here she was hospitalized from 11/3 through 11/10 with coag negative staphylococcal bacteremia. I do not know ?the the exact cause of this was determined. She was seen by vascular and they replaced her permacath on the right side of her neck. Her repeat ?blood cultures were negative. She is still on IV vancomycin ando Ceftazidime at dialysis. She generally feels systemically well she does not have a ?fever she is still receiving dialysis but reports she voids up to 8 times a day ?She has home health changing the dressing I believe they are using silver alginate under 3 layer compression. In addition to the large wound on ?her right anterior foot that we initially saw her for she has apparently a right  lateral leg wound that she suffered during a fall and 2 skin tears on ?the left anterior lower leg ?12/8; patient comes in looking a lot better  this week. One of the 2 skin tears she has on the left leg is healed the othe

## 2022-02-14 NOTE — Progress Notes (Addendum)
[name], [name] ([id number]) Visit Report for 02/14/2022 Arrival Information Details [name], [name] Date of Service: 02/14/2022 12:45 PM Medical Record Number: [id number] Patient Account Number: [id number] Date of Birth/Sex: 1933/10/13 (88 y.o. F) Treating RN: Levora Dredge Primary Care Stacey Sago: Fulton Reek Other Clinician: Referring Daimion Adamcik: Fulton Reek Treating Starsky Nanna/Extender: Skipper Cliche in Treatment: 2 Visit Information History Since Last Visit Added or deleted any medications: No Patient Arrived: Wheel Chair Any new allergies or adverse reactions: No Arrival Time: 12:39 Had a fall or experienced change in No Accompanied By: husband activities of daily living that may affect Transfer Assistance: EasyPivot Patient Lift risk of falls: Patient Requires Transmission-Based No Hospitalized since last visit: No Precautions: Has Dressing in Place as Prescribed: Yes Patient Has Alerts: Yes Pain Present Now: No Patient Alerts: Patient on Blood Thinner ABI R/L [place] 02/16/22 TBI 02/16/22 R 1.02 L 0.79 Electronic Signature(s) Signed: 02/20/2022 10:54:48 AM By: Levora Dredge Previous Signature: 02/14/2022 4:14:45 PM Version By: Levora Dredge Entered By: Levora Dredge on 02/20/2022 10:54:48 [name] ([id number]) -------------------------------------------------------------------------------- Clinic Level of Care Assessment Details [name] Date of Service: 02/14/2022 12:45 PM Medical Record Number: [id number] Patient Account Number: [id number] Date of Birth/Sex: [birth year] (87 y.o. F) Treating RN: Levora Dredge Primary Care Orin Eberwein: Fulton Reek Other Clinician: Referring Teliah Buffalo: Fulton Reek Treating Dezirae Service/Extender: Skipper Cliche in Treatment: 2 Clinic Level of Care Assessment Items TOOL 1 Quantity Score []  - Use when EandM and Procedure is performed on INITIAL visit 0 ASSESSMENTS -  Nursing Assessment / Reassessment []  - General Physical Exam (combine w/ comprehensive assessment (listed just below) when performed on new 0 pt. evals) []  - 0 Comprehensive Assessment (HX, ROS, Risk Assessments, Wounds Hx, etc.) ASSESSMENTS - Wound and Skin Assessment / Reassessment []  - Dermatologic / Skin Assessment (not related to wound area) 0 ASSESSMENTS - Ostomy and/or Continence Assessment and Care []  - Incontinence Assessment and Management 0 []  - 0 Ostomy Care Assessment and Management (repouching, etc.) PROCESS - Coordination of Care []  - Simple Patient / Family Education for ongoing care 0 []  - 0 Complex (extensive) Patient / Family Education for ongoing care []  - 0 Staff obtains Programmer, systems, Records, Test Results / Process Orders []  - 0 Staff telephones HHA, Nursing Homes / Clarify orders / etc []  - 0 Routine Transfer to another Facility (non-emergent condition) []  - 0 Routine Hospital Admission (non-emergent condition) []  - 0 New Admissions / Biomedical engineer / Ordering NPWT, Apligraf, etc. []  - 0 Emergency Hospital Admission (emergent condition) PROCESS - Special Needs []  - Pediatric / Minor Patient Management 0 []  - 0 Isolation Patient Management []  - 0 Hearing / Language / Visual special needs []  - 0 Assessment of Community assistance (transportation, D/C planning, etc.) []  - 0 Additional assistance / Altered mentation []  - 0 Support Surface(s) Assessment (bed, cushion, seat, etc.) INTERVENTIONS - Miscellaneous []  - External ear exam 0 []  - 0 Patient Transfer (multiple staff / Civil Service fast streamer / Similar devices) []  - 0 Simple Staple / Suture removal (25 or less) []  - 0 Complex Staple / Suture removal (26 or more) []  - 0 Hypo/Hyperglycemic Management (do not check if billed separately) []  - 0 Ankle / Brachial Index (ABI) - do not check if billed separately Has the patient been seen at the hospital within the last three years: Yes Total Score:  0 Level Of Care: ____ [name] ([id number]) Electronic Signature(s) Signed: 02/14/2022 4:14:45 PM By: Levora Dredge Entered By:  Levora Dredge on 02/14/2022 13:31:49 [name], [name] ([id number]) -------------------------------------------------------------------------------- Encounter Discharge Information Details [name], [name] Date of Service: 02/14/2022 12:45 PM Medical Record Number: [id number] Patient Account Number: [id number] Date of Birth/Sex: 1933-11-19 (88 y.o. F) Treating RN: Levora Dredge Primary Care Breniyah Romm: Fulton Reek Other Clinician: Referring Sophea Rackham: Fulton Reek Treating Khamani Daniely/Extender: Skipper Cliche in Treatment: 2 Encounter Discharge Information Items Post Procedure Vitals Discharge Condition: Stable Temperature (F): 97.7 Ambulatory Status: Wheelchair Pulse (bpm): 79 Discharge Destination: Home Respiratory Rate (breaths/min): 18 Transportation: Private Auto Blood Pressure (mmHg): 142/69 Accompanied By: husband Schedule Follow-up Appointment: Yes Clinical Summary of Care: Patient Declined Electronic Signature(s) Signed: 02/14/2022 4:14:45 PM By: Levora Dredge Entered By: Levora Dredge on 02/14/2022 13:45:24 [name] ([id number]) -------------------------------------------------------------------------------- Lower Extremity Assessment Details [name] Date of Service: 02/14/2022 12:45 PM Medical Record Number: [id number] Patient Account Number: [id number] Date of Birth/Sex: [birth year] (87 y.o. F) Treating RN: Levora Dredge Primary Care Alanna Storti: Fulton Reek Other Clinician: Referring Xhaiden Coombs: Fulton Reek Treating Lamorris Knoblock/Extender: Skipper Cliche in Treatment: 2 Edema Assessment Assessed: [Left: No] [Right: No] Edema: [Left: Yes] [Right: Yes] Calf Left: Right: Point of Measurement: 30 cm From Medial Instep 33.2 cm 36.7 cm Ankle Left: Right: Point  of Measurement: 10 cm From Medial Instep 27.8 cm 29 cm Vascular Assessment Pulses: Dorsalis Pedis Palpable: [Left:Yes] [Right:Yes] Electronic Signature(s) Signed: 02/14/2022 4:14:45 PM By: Levora Dredge Entered By: Levora Dredge on 02/14/2022 13:00:02 [name] ([id number]) -------------------------------------------------------------------------------- Multi Wound Chart Details [name] Date of Service: 02/14/2022 12:45 PM Medical Record Number: [id number] Patient Account Number: [id number] Date of Birth/Sex: Apr 15, 1934 (88 y.o. 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 N/A Extremity Comorbid History: Congestive Heart Failure, Congestive Heart Failure, N/A Hypertension, Type II Diabetes, Hypertension, Type II Diabetes, Received Radiation Received Radiation Date Acquired: 08/02/2021 02/07/2022 N/A Weeks of Treatment: 2 1 N/A Wound Status: Open Open N/A Wound Recurrence: No No N/A Measurements L x W x D (cm) 0.6x0.8x0.1 0.3x1x0.1 N/A Area (cm) : 0.377 0.236 N/A Volume (cm) : 0.038 0.024 N/A % Reduction in Area: 71.80% 24.80% N/A % Reduction in Volume: 71.60% 22.60% N/A Classification: Grade 2 Full Thickness Without Exposed N/A Support Structures Exudate Amount: Medium Medium N/A Exudate Type: Serosanguineous Serosanguineous N/A Exudate Color: red, brown red, brown N/A Granulation Amount: Small (1-33%) Medium (34-66%)  N/A Granulation Quality: Pink Red N/A Necrotic Amount: Large (67-100%) Small (1-33%) N/A Exposed Structures: Fat Layer (Subcutaneous Tissue): Fat Layer (Subcutaneous Tissue): N/A Yes Yes Fascia: No Tendon: No Muscle: No Joint: No Bone: No Epithelialization: None None N/A Treatment Notes Electronic Signature(s) Signed: 02/14/2022 4:14:45 PM By: Levora Dredge Entered By: Levora Dredge on 02/14/2022 13:01:19 [name] ([id number]) -------------------------------------------------------------------------------- [place] Details [name] Date of Service: 02/14/2022 12:45 PM Medical Record Number: [id number] Patient Account Number: [id number] Date of Birth/Sex: May 12, 1934 (88 y.o. F) Treating RN: Levora Dredge Primary Care Kleo Dungee: Fulton Reek Other Clinician: Referring Sherlon Nied: Fulton Reek Treating Cailen Mihalik/Extender: Skipper Cliche in Treatment: 2 Active Inactive Wound/Skin Impairment Nursing Diagnoses: Knowledge deficit related to ulceration/compromised skin integrity Goals:  Patient/caregiver will verbalize understanding of skin care regimen Date Initiated: 01/26/2022 Date Inactivated: 02/14/2022 Target Resolution Date: 02/25/2022 Goal Status: Met Ulcer/skin breakdown will have a volume reduction of 30% by week 4 Date Initiated: 01/26/2022 Target Resolution Date: 02/25/2022 Goal Status: Active Ulcer/skin breakdown will have a volume reduction of 50% by week 8 Date Initiated: 01/26/2022 Target Resolution Date: 03/28/2022 Goal Status: Active Ulcer/skin breakdown will have a volume reduction of 80% by week 12 Date Initiated: 01/26/2022 Target Resolution Date: 04/27/2022 Goal Status: Active Ulcer/skin breakdown will heal within 14 weeks Date Initiated: 01/26/2022 Target Resolution Date: 05/28/2022 Goal Status: Active Interventions: Assess patient/caregiver ability to obtain necessary supplies Assess  patient/caregiver ability to perform ulcer/skin care regimen upon admission and as needed Assess ulceration(s) every visit Notes: Electronic Signature(s) Signed: 02/14/2022 4:14:45 PM By: Levora Dredge Entered By: Levora Dredge on 02/14/2022 13:00:41 [name] ([id number]) -------------------------------------------------------------------------------- Pain Assessment Details [name] Date of Service: 02/14/2022 12:45 PM Medical Record Number: [id number] Patient Account Number: [id number] Date of Birth/Sex: 28-Oct-1933 (88 y.o. F) Treating RN: Levora Dredge Primary Care Nadim Malia: Fulton Reek Other Clinician: Referring Leane Loring: Fulton Reek Treating Doyle Tegethoff/Extender: Skipper Cliche in Treatment: 2 Active Problems Location of Pain Severity and Description of Pain Patient Has Paino No Site Locations Rate the pain. Current Pain Level: 0 Pain Management and Medication Current Pain Management: Electronic Signature(s) Signed: 02/14/2022 4:14:45 PM By: Levora Dredge Entered By: Levora Dredge on 02/14/2022 12:45:34 [name] ([id number]) -------------------------------------------------------------------------------- Patient/Caregiver Education Details [name] Date of Service: 02/14/2022 12:45 PM Medical Record Number: [id number] Patient Account Number: [id number] Date of Birth/Gender: 06-03-1934 (88 y.o. F) Treating RN: Levora Dredge Primary Care Physician: Fulton Reek Other Clinician: Referring Physician: Fulton Reek Treating Physician/Extender: Skipper Cliche in Treatment: 2 Education Assessment Education Provided To: Patient and Caregiver Education Topics Provided Wound Debridement: Handouts: Wound Debridement Methods: Explain/Verbal Responses: State content correctly Wound/Skin Impairment: Handouts: Caring for Your Ulcer Methods: Explain/Verbal Responses: State content  correctly Electronic Signature(s) Signed: 02/14/2022 4:14:45 PM By: Levora Dredge Entered By: Levora Dredge on 02/14/2022 13:44:37 [name] ([id number]) -------------------------------------------------------------------------------- Wound Assessment Details [name] Date of Service: 02/14/2022 12:45 PM Medical Record Number: [id number] Patient Account Number: [id number] Date of Birth/Sex: 05/19/1934 (87 y.o. F) Treating RN: Levora Dredge Primary Care Lamario Mani: Fulton Reek Other Clinician: Referring Dashonda Bonneau: Fulton Reek Treating Dudley Mages/Extender: Skipper Cliche in Treatment: 2 Wound Status Wound Number: 5 Primary Diabetic Wound/Ulcer of the Lower Extremity Etiology: Wound Location: Dorsal Foot Wound Status: Open Wounding Event: Gradually Appeared Comorbid Congestive Heart Failure, Hypertension, Type II Date Acquired: 08/02/2021 History: Diabetes, Received Radiation Weeks Of Treatment: 2 Clustered Wound: No Photos Wound Measurements Length: (cm) 0.6 Width: (cm) 0.8 Depth: (cm) 0.1 Area: (cm) 0.377 Volume: (cm) 0.038 % Reduction in Area: 71.8% % Reduction in Volume: 71.6% Epithelialization: None Tunneling: No Undermining: No Wound Description Classification: Grade 2 Exudate Amount: Medium Exudate Type: Serosanguineous Exudate Color: red, brown Foul Odor After Cleansing: No Slough/Fibrino Yes Wound Bed Granulation Amount: Small (1-33%) Exposed Structure Granulation Quality: Pink Fascia Exposed: No Necrotic Amount: Large (67-100%) Fat Layer (Subcutaneous Tissue) Exposed: Yes Necrotic Quality: Adherent Slough Tendon Exposed: No Muscle Exposed: No Joint Exposed: No Bone Exposed: No Electronic Signature(s) Signed: 02/14/2022 4:14:45 PM By: Levora Dredge Entered By: Levora Dredge on 02/14/2022 12:58:18 [name]  ([id number]) -------------------------------------------------------------------------------- Wound Assessment Details [name] Date of Service: 02/14/2022 12:45 PM Medical Record Number: [id number] Patient Account Number:  [id number] Date of Birth/Sex: 10-20-1933 (88 y.o. F) Treating RN: Levora Dredge Primary Care Amandeep Hogston: Fulton Reek Other Clinician: Referring Alisi Lupien: Fulton Reek Treating Naarah Borgerding/Extender: Skipper Cliche in Treatment: 2 Wound Status Wound Number: 6 Primary Skin Tear Etiology: Wound Location: Right, Proximal, Medial Lower Leg Wound Status: Open Wounding Event: Gradually Appeared Notes: significant swelling in leg Date Acquired: 02/07/2022 Comorbid Congestive Heart Failure, Hypertension, Type II Weeks Of Treatment: 1 History: Diabetes, Received Radiation Clustered Wound: No Photos Wound Measurements Length: (cm) 0.3 Width: (cm) 1 Depth: (cm) 0.1 Area: (cm) 0.236 Volume: (cm) 0.024 % Reduction in Area: 24.8% % Reduction in Volume: 22.6% Epithelialization: None Tunneling: No Undermining: No Wound Description Classification: Full Thickness Without Exposed Support Structures Exudate Amount: Medium Exudate Type: Serosanguineous Exudate Color: red, brown Foul Odor After Cleansing: No Slough/Fibrino Yes Wound Bed Granulation Amount: Medium (34-66%) Exposed Structure Granulation Quality: Red Fat Layer (Subcutaneous Tissue) Exposed: Yes Necrotic Amount: Small (1-33%) Necrotic Quality: Adherent Slough Treatment Notes Wound #6 (Lower Leg) Wound Laterality: Right, Medial, Proximal Cleanser Normal Saline Discharge Instruction: Wash your hands with soap and water. Remove old dressing, discard into plastic bag and place into trash. Cleanse the wound with Normal Saline prior to applying a clean dressing using gauze sponges, not tissues or cotton balls. Do not scrub or use excessive force. Pat dry using gauze sponges, not  tissue or cotton balls. Soap and Water Discharge Instruction: Gently cleanse wound with antibacterial soap, rinse and pat dry prior to dressing wounds Dougherty, Sarah T. ([id number]) Topical Primary Dressing Xeroform 5x9-HBD (in/in) Discharge Instruction: Apply Xeroform 5x9-HBD (in/in) as directed Secondary Dressing ABD Pad 5x9 (in/in) Discharge Instruction: Cover with ABD pad Secured With Tubigrip Size E, 3.5x10 (in/yds) Discharge Instruction: Apply 3 Tubigrip E 3-finger-widths below knee to base of toes to secure dressing and/or for swelling. Compression Wrap Compression Stockings Add-Ons Electronic Signature(s) Signed: 02/14/2022 4:14:45 PM By: Levora Dredge Entered By: Levora Dredge on 02/14/2022 12:59:24 [name] ([id number]) -------------------------------------------------------------------------------- Vitals Details [name] Date of Service: 02/14/2022 12:45 PM Medical Record Number: [id number] Patient Account Number: [id number] Date of Birth/Sex: 02/19/1934 (87 y.o. F) Treating RN: Levora Dredge Primary Care Ahan Eisenberger: Fulton Reek Other Clinician: Referring Zyir Gassert: Fulton Reek Treating Piera Downs/Extender: Skipper Cliche in Treatment: 2 Vital Signs Time Taken: 12:43 Temperature (F): 97.7 Height (in): 64 Pulse (bpm): 76 Weight (lbs): 160 Respiratory Rate (breaths/min): 18 Body Mass Index (BMI): 27.5 Blood Pressure (mmHg): 142/69 Reference Range: 80 - 120 mg / dl Airway Pulse Oximetry (%): 94 Inhaled Oxygen Concentration (%): 2 Electronic Signature(s) Signed: 02/14/2022 4:14:45 PM By: Levora Dredge Entered By: Levora Dredge on 02/14/2022 12:44:55

## 2022-02-14 NOTE — Assessment & Plan Note (Signed)
Her carotid duplex today shows velocities in the 1 to 39% range bilaterally which is unchanged from her study last year.  No role for intervention in a debilitated elderly dialysis patient at this level of disease.  Continue current medical regimen.  Check in 1 year. ?

## 2022-02-14 NOTE — Assessment & Plan Note (Signed)
ABIs today are elevated from medial calcification, but in the normal range with normal waveforms and normal digital pressures bilaterally.  No role for intervention.  Recheck in 1 year. ?

## 2022-02-14 NOTE — Assessment & Plan Note (Signed)
Left arm AV fistula is currently working well without any issues.  To be checked again later this year. ?

## 2022-02-14 NOTE — Assessment & Plan Note (Signed)
blood pressure control important in reducing the progression of atherosclerotic disease. On appropriate oral medications.  

## 2022-02-21 ENCOUNTER — Encounter: Payer: Medicare Other | Admitting: Physician Assistant

## 2022-02-21 DIAGNOSIS — E1151 Type 2 diabetes mellitus with diabetic peripheral angiopathy without gangrene: Secondary | ICD-10-CM | POA: Diagnosis not present

## 2022-02-21 NOTE — Progress Notes (Signed)
RICKEYA, MANUS ([id number]) Visit Report for 02/21/2022 Chief Complaint Document Details Patient Name: BLAKLEE, SHORES Date of Service: 02/21/2022 11:30 AM Medical Record Number: [id number] Patient Account Number: [id number] Date of Birth/Sex: 1933-12-07 (88 y.o. F) Treating RN: Alycia Rossetti Primary Care Provider: Fulton Reek Other Clinician: Cornell Barman Referring Provider: Fulton Reek Treating Provider/Extender: Skipper Cliche in Treatment: 3 Information Obtained from: Patient Chief Complaint 07/14/2020; patient sent over here for a hemorrhagic blister on the right dorsal foot by her primary doctor Dr. Doy Hutching at [facility]. 01/26/2022; patient referred from nephrology at dialysis for review of a wound on the left dorsal foot distally Electronic Signature(s) Signed: 02/21/2022 11:32:38 AM By: Worthy Keeler PA-C Entered By: Worthy Keeler on 02/21/2022 11:32:38 [name] ([id number]) -------------------------------------------------------------------------------- Problem List Details Patient Name: [name] Date of Service: 02/21/2022 11:30 AM Medical Record Number: [id number] Patient Account Number: [id number] Date of Birth/Sex: Apr 27, 1934 (87 y.o. 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

## 2022-02-22 NOTE — Progress Notes (Signed)
LIDIYA, REISE ([id number]) Visit Report for 02/21/2022 Arrival Information Details Patient Name: KENDRA, GRISSETT Date of Service: 02/21/2022 11:30 AM Medical Record Number: [id number] Patient Account Number: [id number] Date of Birth/Sex: [birth year] (88 y.o. F) Treating RN: Alycia Rossetti Primary Care Dustin Bumbaugh: Fulton Reek Other Clinician: Cornell Barman Referring Malayla Granberry: Fulton Reek Treating Cyara Devoto/Extender: Skipper Cliche in Treatment: 3 Visit Information History Since Last Visit Added or deleted any medications: No Patient Arrived: Wheel Chair Any new allergies or adverse reactions: No Arrival Time: 11:30 Had a fall or experienced change in No Accompanied By: husband activities of daily living that may affect Transfer Assistance: None risk of falls: Patient Identification Verified: Yes Hospitalized since last visit: No Secondary Verification Process Completed: Yes Has Dressing in Place as Prescribed: Yes Patient Requires Transmission-Based No Has Compression in Place as Prescribed: Yes Precautions: Pain Present Now: No Patient Has Alerts: Yes Patient Alerts: Patient on Blood Thinner ABI R/L Ty[place] 02/16/22 TBI 02/16/22 R 1.02 L 0.79 Electronic Signature(s) Signed: 02/22/2022 4:54:43 PM By: Alycia Rossetti Entered By: Alycia Rossetti on 02/21/2022 11:32:07 [name] ([id number]) -------------------------------------------------------------------------------- Clinic Level of Care Assessment Details Patient Name: [name] Date of Service: 02/21/2022 11:30 AM Medical Record Number: [id number] Patient Account Number: [id number] Date of Birth/Sex: November 28, 1933 (87 y.o. F) Treating RN: Alycia Rossetti Primary Care Shanitha Twining: Fulton Reek Other Clinician: Cornell Barman Referring Maurya Nethery: Fulton Reek Treating Emarie Paul/Extender: Skipper Cliche in Treatment: 3 Clinic Level of Care Assessment Items TOOL 4 Quantity Score []  - Use when only an EandM is  performed on FOLLOW-UP visit 0 ASSESSMENTS - Nursing Assessment / Reassessment X - Reassessment of Co-morbidities (includes updates in patient status) 1 10 X- 1 5 Reassessment of Adherence to Treatment Plan ASSESSMENTS - Wound and Skin Assessment / Reassessment []  - Simple Wound Assessment / Reassessment - one wound 0 X- 2 5 Complex Wound Assessment / Reassessment - multiple wounds []  - 0 Dermatologic / Skin Assessment (not related to wound area) ASSESSMENTS - Focused Assessment []  - Circumferential Edema Measurements - multi extremities 0 []  - 0 Nutritional Assessment / Counseling / Intervention []  - 0 Lower Extremity Assessment (monofilament, tuning fork, pulses) []  - 0 Peripheral Arterial Disease Assessment (using hand held doppler) ASSESSMENTS - Ostomy and/or Continence Assessment and Care []  - Incontinence Assessment and Management 0 []  - 0 Ostomy Care Assessment and Management (repouching, etc.) PROCESS - Coordination of Care X - Simple Patient / Family Education for ongoing care 1 15 []  - 0 Complex (extensive) Patient / Family Education for ongoing care []  - 0 Staff obtains Programmer, systems, Records, Test Results / Process Orders []  - 0 Staff telephones HHA, Nursing Homes / Clarify orders / etc []  - 0 Routine Transfer to another Facility (non-emergent condition) []  - 0 Routine Hospital Admission (non-emergent condition) []  - 0 New Admissions / Biomedical engineer / Ordering NPWT, Apligraf, etc. []  - 0 Emergency Hospital Admission (emergent condition) X- 1 10 Simple Discharge Coordination []  - 0 Complex (extensive) Discharge Coordination PROCESS - Special Needs []  - Pediatric / Minor Patient Management 0 []  - 0 Isolation Patient Management []  - 0 Hearing / Language / Visual special needs []  - 0 Assessment of Community assistance (transportation, D/C planning, etc.) []  - 0 Additional assistance / Altered mentation []  - 0 Support Surface(s) Assessment (bed,  cushion, seat, etc.) INTERVENTIONS - Wound Cleansing / Measurement Schulte, Ronnita T. ([id number]) []  - 0 Simple Wound Cleansing - one wound X- 2 5 Complex Wound Cleansing - multiple wounds []  -  0 Wound Imaging (photographs - any number of wounds) []  - 0 Wound Tracing (instead of photographs) []  - 0 Simple Wound Measurement - one wound X- 2 5 Complex Wound Measurement - multiple wounds INTERVENTIONS - Wound Dressings []  - Small Wound Dressing one or multiple wounds 0 X- 1 15 Medium Wound Dressing one or multiple wounds []  - 0 Large Wound Dressing one or multiple wounds []  - 0 Application of Medications - topical []  - 0 Application of Medications - injection INTERVENTIONS - Miscellaneous []  - External ear exam 0 []  - 0 Specimen Collection (cultures, biopsies, blood, body fluids, etc.) []  - 0 Specimen(s) / Culture(s) sent or taken to Lab for analysis []  - 0 Patient Transfer (multiple staff / Civil Service fast streamer / Similar devices) []  - 0 Simple Staple / Suture removal (25 or less) []  - 0 Complex Staple / Suture removal (26 or more) []  - 0 Hypo / Hyperglycemic Management (close monitor of Blood Glucose) []  - 0 Ankle / Brachial Index (ABI) - do not check if billed separately X- 1 5 Vital Signs Has the patient been seen at the hospital within the last three years: Yes Total Score: 90 Level Of Care: New/Established - Level 3 Electronic Signature(s) Signed: 02/22/2022 4:54:43 PM By: Alycia Rossetti Entered By: Alycia Rossetti on 02/21/2022 12:34:09 [name] ([id number]) -------------------------------------------------------------------------------- Encounter Discharge Information Details Patient Name: [name] Date of Service: 02/21/2022 11:30 AM Medical Record Number: [id number] Patient Account Number: [id number] Date of Birth/Sex: [birth year] (87 y.o. F) Treating RN: Alycia Rossetti Primary Care Wetona Viramontes: Fulton Reek Other Clinician: Cornell Barman Referring  Earnestine Shipp: Fulton Reek Treating Jodel Mayhall/Extender: Skipper Cliche in Treatment: 3 Encounter Discharge Information Items Discharge Condition: Stable Ambulatory Status: Wheelchair Discharge Destination: Home Transportation: Private Auto Accompanied By: husband Schedule Follow-up Appointment: Yes Clinical Summary of Care: Electronic Signature(s) Signed: 02/22/2022 4:54:43 PM By: Alycia Rossetti Entered By: Alycia Rossetti on 02/21/2022 12:37:25 [name] ([id number]) -------------------------------------------------------------------------------- Lower Extremity Assessment Details Patient Name: [name] Date of Service: 02/21/2022 11:30 AM Medical Record Number: [id number] Patient Account Number: [id number] Date of Birth/Sex: 10-Sep-1934 (87 y.o. F) Treating RN: Alycia Rossetti Primary Care Ariona Deschene: Fulton Reek Other Clinician: Cornell Barman Referring Akansha Wyche: Fulton Reek Treating Maxi Carreras/Extender: Skipper Cliche in Treatment: 3 Edema Assessment Assessed: [Left: No] [Right: No] Edema: [Left: Yes] [Right: Yes] Calf Left: Right: Point of Measurement: 30 cm From Medial Instep 34.8 cm 37.6 cm Ankle Left: Right: Point of Measurement: 10 cm From Medial Instep 28 cm 31.4 cm Vascular Assessment Pulses: Dorsalis Pedis Doppler Audible: [Left:Yes] [Right:Yes] Electronic Signature(s) Signed: 02/22/2022 4:54:43 PM By: Alycia Rossetti Entered By: Alycia Rossetti on 02/21/2022 11:59:52 [name] ([id number]) -------------------------------------------------------------------------------- Multi Wound Chart Details Patient Name: [name] Date of Service: 02/21/2022 11:30 AM Medical Record Number: [id number] Patient Account Number: [id number] Date of Birth/Sex: 1934/06/09 (87 y.o. 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] [6:Skin Tear] [N/A:N/A] Comorbid History: [5:Congestive Heart Failure, Hypertension, Type II Diabetes, Received Radiation] [6:Congestive Heart Failure, Hypertension, Type II Diabetes, Received Radiation] [N/A:N/A] Date Acquired: [5:08/02/2021] [6:02/07/2022] [N/A:N/A] Weeks of  Treatment: [5:3] [6:2] [N/A:N/A] Wound Status: [5:Open] [6:Open] [N/A:N/A] Wound Recurrence: [5:No] [6:No] [N/A:N/A] Measurements L x W x D (cm) [5:0.6x0.5x0.1] [6:0.2x0.4x0.1] [N/A:N/A] Area (cm) : [5:0.236] [6:0.063] [N/A:N/A] Volume (cm) : [5:0.024] [9:3.734] [N/A:N/A] % Reduction in Area: [5:82.30%] [6:79.90%] [N/A:N/A] % Reduction in Volume: [5:82.10%] [6:80.60%] [N/A:N/A] Classification: [5:Grade 2] [6:Full Thickness Without Exposed Support Structures] [N/A:N/A] Exudate Amount: [5:Medium] [6:None Present] [N/A:N/A] Exudate Type: [5:Serosanguineous] [6:N/A] [N/A:N/A] Exudate Color: [5:red, brown] [6:N/A] [N/A:N/A] Granulation Amount: [5:Small (1-33%)] [6:Large (67-100%)] [N/A:N/A] Granulation Quality: [5:Pink] [6:Red] [N/A:N/A] Necrotic Amount: [5:Large (67-100%)] [6:None Present (0%)] [N/A:N/A] Exposed Structures: [5:Fat Layer (Subcutaneous Tissue): Yes Fascia: No Tendon: No Muscle: No Joint: No Bone: No Small (1-33%)] [6:Fat Layer (Subcutaneous Tissue): Yes None] [N/A:N/A N/A] Treatment Notes Electronic Signature(s) Signed: 02/22/2022 4:54:43 PM By: Alycia Rossetti Entered By: Alycia Rossetti on 02/21/2022 12:36:21 [name]  ([id number]) -------------------------------------------------------------------------------- [place] Details Patient Name: [name] Date of Service: 02/21/2022 11:30 AM Medical Record Number: [id number] Patient Account Number: [id number] Date of Birth/Sex: [birth year] (87 y.o. F) Treating RN: Alycia Rossetti Primary Care Bhavana Kady: Fulton Reek Other Clinician: Cornell Barman Referring Jayr Lupercio: Fulton Reek Treating Katrinia Straker/Extender: Skipper Cliche in Treatment: 3 Active Inactive Wound/Skin Impairment Nursing Diagnoses: Knowledge deficit related to ulceration/compromised skin integrity Goals: Patient/caregiver will verbalize understanding of skin care regimen Date Initiated: 01/26/2022 Date Inactivated: 02/14/2022 Target Resolution Date: 02/25/2022 Goal Status: Met Ulcer/skin breakdown will have a volume reduction of 30% by week 4 Date Initiated: 01/26/2022 Target Resolution Date: 02/25/2022 Goal Status: Active Ulcer/skin breakdown will have a volume reduction of 50% by week 8 Date Initiated: 01/26/2022 Target Resolution Date: 03/28/2022 Goal Status: Active Ulcer/skin breakdown will have a volume reduction of 80% by week 12 Date Initiated: 01/26/2022 Target Resolution Date: 04/27/2022 Goal Status: Active Ulcer/skin breakdown will heal within 14 weeks Date Initiated: 01/26/2022 Target Resolution Date: 05/28/2022 Goal Status: Active Interventions: Assess patient/caregiver ability to obtain necessary supplies Assess patient/caregiver ability to perform ulcer/skin care regimen upon admission and as needed Assess ulceration(s) every visit Notes: Electronic Signature(s) Signed: 02/22/2022 4:54:43 PM By: Alycia Rossetti Entered By: Alycia Rossetti on 02/21/2022 12:36:13 [name] ([id number]) -------------------------------------------------------------------------------- Pain Assessment Details Patient Name: [name] Date of Service:  02/21/2022 11:30 AM Medical Record Number: [id number] Patient Account Number: [id number] Date of Birth/Sex: May 01, 1934 (88 y.o. F) Treating RN: Alycia Rossetti Primary Care Oveta Idris: Fulton Reek Other Clinician: Cornell Barman Referring Lynnett Langlinais: Fulton Reek Treating Brita Jurgensen/Extender: Skipper Cliche in Treatment: 3 Active Problems Location of Pain Severity and Description of Pain Patient Has Paino No Site Locations Pain Management and Medication Current Pain Management: Electronic Signature(s) Signed: 02/22/2022 4:54:43 PM By: Alycia Rossetti Entered By: Alycia Rossetti on 02/21/2022 11:33:05 [name] ([id number]) -------------------------------------------------------------------------------- Patient/Caregiver Education Details Patient Name: [name] Date of Service: 02/21/2022 11:30 AM Medical Record Number: [id number] Patient Account Number: [id number] Date of Birth/Gender: 07-13-1934 (88 y.o. F) Treating RN: Alycia Rossetti Primary Care Physician: Fulton Reek Other Clinician: Cornell Barman Referring Physician: Fulton Reek Treating Physician/Extender: Skipper Cliche in Treatment: 3 Education Assessment Education Provided To: Patient Education Topics Provided Wound/Skin Impairment: Handouts: Caring for Your Ulcer Methods: Demonstration, Explain/Verbal Responses: State content correctly Electronic Signature(s) Signed: 02/22/2022 4:54:43 PM By: Alycia Rossetti Entered By: Alycia Rossetti on 02/21/2022 12:34:43 [name] ([id number]) -------------------------------------------------------------------------------- Wound Assessment Details Patient Name: [name] Date of Service: 02/21/2022 11:30 AM Medical Record Number: [id number] Patient Account Number: [id number] Date of Birth/Sex: 31-Oct-1933 (87 y.o. F) Treating RN: Alycia Rossetti Primary Care  Layth Cerezo: Fulton Reek Other Clinician: Cornell Barman Referring Jyasia Markoff: Fulton Reek Treating Malone Admire/Extender: Skipper Cliche in Treatment: 3 Wound Status Wound Number: 5 Primary Diabetic Wound/Ulcer of the Lower Extremity Etiology: Wound Location: Left, Dorsal Foot Wound Status: Open Wounding Event: Gradually Appeared Comorbid Congestive Heart Failure, Hypertension, Type II Date Acquired: 08/02/2021 History: Diabetes, Received Radiation Weeks Of Treatment: 3 Clustered Wound: No Wound Measurements Length: (cm) 0.6 Width: (cm) 0.5 Depth: (cm) 0.1 Area: (cm) 0.236 Volume: (cm) 0.024 % Reduction in Area: 82.3% % Reduction in Volume: 82.1% Epithelialization: Small (1-33%) Wound Description Classification: Grade 2 Exudate Amount: Medium Exudate Type: Serosanguineous Exudate Color: red, brown Foul Odor After Cleansing: No Slough/Fibrino Yes Wound Bed Granulation Amount: Small (1-33%) Exposed Structure Granulation Quality: Pink Fascia Exposed: No Necrotic Amount: Large (67-100%) Fat Layer (Subcutaneous Tissue) Exposed: Yes Necrotic Quality: Adherent Slough Tendon Exposed: No Muscle Exposed: No Joint Exposed: No Bone Exposed: No Treatment Notes Wound #5 (Foot) Wound Laterality: Dorsal, Left Cleanser Peri-Wound Care Topical Primary Dressing Xeroform 5x9-HBD (in/in) Discharge Instruction: Apply Xeroform 5x9-HBD (in/in) as directed Secondary Dressing ABD Pad 5x9 (in/in) Discharge Instruction: Cover with ABD pad Kerlix 4.5 x 4.1 (in/yd) Discharge Instruction: Apply Kerlix 4.5 x 4.1 (in/yd) as instructed Secured With Compression Wrap coban Compression Stockings JOZIE, WULF ([id number]) Add-Ons Electronic Signature(s) Signed: 02/22/2022 4:54:43 PM By: Alycia Rossetti Entered By: Alycia Rossetti on 02/21/2022 11:55:37 [name] ([id number]) -------------------------------------------------------------------------------- Wound Assessment Details Patient Name: [name] Date of Service: 02/21/2022 11:30 AM Medical  Record Number: [id number] Patient Account Number: [id number] Date of Birth/Sex: December 21, 1933 (87 y.o. F) Treating RN: Alycia Rossetti Primary Care Leshae Mcclay: Fulton Reek Other Clinician: Cornell Barman Referring Kauan Kloosterman: Fulton Reek Treating Hussien Greenblatt/Extender: Skipper Cliche in Treatment: 3 Wound Status Wound Number: 6 Primary Skin Tear Etiology: Wound Location: Right, Proximal, Medial Lower Leg Wound Status: Open Wounding Event: Gradually Appeared Notes: significant swelling in leg Date Acquired: 02/07/2022 Comorbid Congestive Heart Failure, Hypertension, Type II Weeks Of Treatment: 2 History: Diabetes, Received Radiation Clustered Wound: No Wound Measurements Length: (cm) 0.2 Width: (cm) 0.4 Depth: (cm) 0.1 Area: (cm) 0.063 Volume: (cm) 0.006 % Reduction in Area: 79.9% % Reduction in Volume: 80.6% Epithelialization: None Wound Description Classification: Full Thickness Without Exposed Support Structures Exudate Amount: None Present Foul Odor After Cleansing: No Slough/Fibrino No Wound Bed Granulation Amount: Large (67-100%) Exposed Structure Granulation Quality: Red Fat Layer (Subcutaneous Tissue) Exposed: Yes Necrotic Amount: None Present (0%) Treatment Notes Wound #6 (Lower Leg) Wound Laterality: Right, Medial, Proximal Cleanser Normal Saline Discharge Instruction: Wash your hands with soap and water. Remove old dressing, discard into plastic bag and place into trash. Cleanse the wound with Normal Saline prior to applying a clean dressing using gauze sponges, not tissues or cotton balls. Do not scrub or use excessive force. Pat dry using gauze sponges, not tissue or cotton balls. Soap and Water Discharge Instruction: Gently cleanse wound with antibacterial soap, rinse and pat dry prior to dressing wounds Peri-Wound Care Topical Primary Dressing Xeroform 5x9-HBD (in/in) Discharge Instruction: Apply Xeroform 5x9-HBD (in/in) as directed Secondary Dressing ABD  Pad 5x9 (in/in) Discharge Instruction: Cover with ABD pad Secured With Tubigrip Size E, 3.5x10 (in/yds) Discharge Instruction: Apply 3 Tubigrip E 3-finger-widths below knee to base of toes to secure dressing and/or for swelling. Compression Wrap SAMIRAH, SCARPATI ([id number]) Compression Stockings Add-Ons Electronic Signature(s) Signed: 02/22/2022 4:54:43 PM By: Alycia Rossetti Entered By: Alycia Rossetti on 02/21/2022 11:59:39 [name] ([id number]) -------------------------------------------------------------------------------- Vitals Details Patient Name: Soyla Murphy  T. Date of Service: 02/21/2022 11:30 AM Medical Record Number: [id number] Patient Account Number: [id number] Date of Birth/Sex: 25-Dec-1933 (88 y.o. F) Treating RN: Alycia Rossetti Primary Care Nohelia Valenza: Fulton Reek Other Clinician: Cornell Barman Referring Therin Vetsch: Fulton Reek Treating Latrina Guttman/Extender: Skipper Cliche in Treatment: 3 Vital Signs Time Taken: 11:32 Temperature (F): 98.1 Height (in): 64 Pulse (bpm): 66 Weight (lbs): 160 Respiratory Rate (breaths/min): 18 Body Mass Index (BMI): 27.5 Blood Pressure (mmHg): 144/71 Reference Range: 80 - 120 mg / dl Electronic Signature(s) Signed: 02/22/2022 4:54:43 PM By: Alycia Rossetti Entered By: Alycia Rossetti on 02/21/2022 11:32:52

## 2022-02-28 ENCOUNTER — Other Ambulatory Visit: Payer: Self-pay | Admitting: Cardiovascular Disease

## 2022-03-02 ENCOUNTER — Encounter: Payer: Medicare Other | Attending: Physician Assistant | Admitting: Physician Assistant

## 2022-03-02 DIAGNOSIS — I2510 Atherosclerotic heart disease of native coronary artery without angina pectoris: Secondary | ICD-10-CM | POA: Insufficient documentation

## 2022-03-02 DIAGNOSIS — E1122 Type 2 diabetes mellitus with diabetic chronic kidney disease: Secondary | ICD-10-CM | POA: Diagnosis not present

## 2022-03-02 DIAGNOSIS — I5040 Unspecified combined systolic (congestive) and diastolic (congestive) heart failure: Secondary | ICD-10-CM | POA: Diagnosis not present

## 2022-03-02 DIAGNOSIS — J449 Chronic obstructive pulmonary disease, unspecified: Secondary | ICD-10-CM | POA: Insufficient documentation

## 2022-03-02 DIAGNOSIS — Z992 Dependence on renal dialysis: Secondary | ICD-10-CM | POA: Diagnosis not present

## 2022-03-02 DIAGNOSIS — E11621 Type 2 diabetes mellitus with foot ulcer: Secondary | ICD-10-CM | POA: Insufficient documentation

## 2022-03-02 DIAGNOSIS — N186 End stage renal disease: Secondary | ICD-10-CM | POA: Insufficient documentation

## 2022-03-02 DIAGNOSIS — I6529 Occlusion and stenosis of unspecified carotid artery: Secondary | ICD-10-CM | POA: Diagnosis not present

## 2022-03-02 DIAGNOSIS — E1151 Type 2 diabetes mellitus with diabetic peripheral angiopathy without gangrene: Secondary | ICD-10-CM | POA: Diagnosis present

## 2022-03-02 DIAGNOSIS — I132 Hypertensive heart and chronic kidney disease with heart failure and with stage 5 chronic kidney disease, or end stage renal disease: Secondary | ICD-10-CM | POA: Insufficient documentation

## 2022-03-02 DIAGNOSIS — Z8673 Personal history of transient ischemic attack (TIA), and cerebral infarction without residual deficits: Secondary | ICD-10-CM | POA: Diagnosis not present

## 2022-03-02 NOTE — Progress Notes (Signed)
TEREASA, YILMAZ ([id number]) Visit Report for 03/02/2022 Chief Complaint Document Details [name], [name] Date of Service: 03/02/2022 2:00 PM Medical Record Number: [id number] Patient Account Number: [id number] Date of Birth/Sex: January 26, 1934 (88 y.o. F) Treating RN: Carlene Coria Primary Care Provider: Fulton Reek Other Clinician: Referring Provider: Fulton Reek Treating Provider/Extender: Skipper Cliche in Treatment: 5 Information Obtained from: Patient Chief Complaint 07/14/2020; patient sent over here for a hemorrhagic blister on the right dorsal foot by her primary doctor Dr. Doy Hutching at [facility]. 01/26/2022; patient referred from nephrology at dialysis for review of a wound on the left dorsal foot distally Electronic Signature(s) Signed: 03/02/2022 2:01:19 PM By: Worthy Keeler PA-C Entered By: Worthy Keeler on 03/02/2022 14:01:19 [name] ([id number]) -------------------------------------------------------------------------------- HPI Details Patient Name: [name] Date of Service: 03/02/2022 2:00 PM Medical Record Number: [id number] Patient Account Number: [id number] Date of Birth/Sex: [birth year] (87 y.o. F) Treating RN: Carlene Coria Primary Care Provider: Fulton Reek Other Clinician: Referring Provider: Fulton Reek Treating Provider/Extender: Skipper Cliche in Treatment: 5 History of Present Illness HPI Description: ADMISSION 07/14/2020. This is a medically complex 88 year old woman who is sent over urgently from her primary care doctor Dr. Doy Hutching at [facility] and we worked her in this afternoon. She is apparently developed a blister on the dorsal foot sometime over the last 2 weeks on the right which seems to have developed a hemorrhagic component to this. The history here is somewhat vague but this is happened since she left the hospital on 07/02/2020. She also has a blistered area on the left dorsal foot  although this does not have a hemorrhagic component to it and it is not threatened to open where is the right side is definitely leaking fluid and could easily break down. I do not believe she has been doing anything specific to this. With regards to her medical history I have reviewed the discharge summary from the hospital from 06/22/2020 through 07/02/2020. 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 She was given prednisone but was not felt to be a candidate for immunosuppression secondary to her age and comorbidities. She is ANA positive p-ANCA positive. She had a right IJ catheter placed on 9/24 and is being done on dialysis. She is a diabetic. She takes prednisone 20 mg twice daily. She was on aspirin and Plavix although I think these have been put on hold. Past medical history includes; pauci-immune crescentic glomerulonephritis. History of peripheral edema and type 2 diabetes left carotid artery stenosis with a history of a CVA, coronary artery disease and hypertension. She states that she tolerates dialysis reasonably poorly secondary to "butt cramps". She says that when she gets discomfort they stop her dialysis. This probably has something to do with her fluid overloaded state I cannot see any arterial evaluation in her record in [facility] link. We could not do arterial ABIs on her because of complaints of discomfort 10/20; 1 week follow-up. This is a patient who is a diabetic. She recently developed acute renal failure because of pauci-immune glomerulonephritis. Sometime during her initial hospitalization she developed a wound on her right foot tremendous swelling in both legs. She was sent over here urgently last week by her primary care physician with what looks to be hematoma still contained on the right dorsal foot. We put silver alginate on this and put her in compression to control some of the  swelling. The left  leg had a similar appearance with severe edema in the left dorsal foot so we put that under compression as well. When she arrived back for nurse follow-up on Friday there was some drainage coming out of the wound which our nurse cultured. This is come back showing Pseudomonas Enterococcus faecalis and staph aureus although the identification of the staph aureus is not complete. I empirically put her on cefdinir 300 mg after dialysis on Tuesday Thursday and Saturday she is only had one dose. She has not been systemically unwell 10/27 1 week follow-up. With considerable help of her nursing staff I was finally able to speak to her nephrologist who manages her at dialysis. Started her on vancomycin and ceftazidime. This to cover the combination of methicillin sensitive staph aureus Enterococcus faecalis and Pseudomonas. Although the staph aureus was methicillin sensitive and the Enterococcus ampicillin sensitive vancomycin provided the best alternative here that can be given at dialysis and ceftazidime to cover the Pseudomonas. She had a traumatic wound on the top of her foot and a complicated abscess. She has significant tissue destruction from this although the wound is looking a lot better today. We are using silver alginate to the surface. X-ray of the foot did not show any deep obvious infection or osteomyelitis 12/1; since the patient was last here she was hospitalized from 11/3 through 11/10 with coag negative staphylococcal bacteremia. I do not know the the exact cause of this was determined. She was seen by vascular and they replaced her permacath on the right side of her neck. Her repeat blood cultures were negative. She is still on IV vancomycin ando Ceftazidime at dialysis. She generally feels systemically well she does not have a fever she is still receiving dialysis but reports she voids up to 8 times a day She has home health changing the dressing I believe they are using  silver alginate under 3 layer compression. In addition to the large wound on her right anterior foot that we initially saw her for she has apparently a right lateral leg wound that she suffered during a fall and 2 skin tears on the left anterior lower leg 12/8; patient comes in looking a lot better this week. One of the 2 skin tears she has on the left leg is healed the other is smaller. The area on the right lateral leg looks improved and her original wound on the right dorsal foot is a lot smaller and looks a lot better. We used Hydrofera Blue to all wound areas under 3 layer compression.. Apparently home health had some trouble with the compression wraps. 12/15 patient comes in with all of her wounds smaller including her original deep punched out wound on the right dorsal foot as well as the skin tears on the right posterior lateral calf and the left anterior lower leg. We have been using Hydrofera Blue under compression excellent results 10/12/2020 upon evaluation today patient appears to be doing decently well currently in regard to her foot ulcer. She does have a small hole still open with some tunneling that really goes proximally in the foot at around the 12:00 location. There is really not significant undermining other locations. This appears to be a small area of where there was a little fluid trapped. Nonetheless I do feel like this is still doing quite well and in general I think that she is going to benefit from possibly packing into this area with endoform in order to keep the area open while it granulates in.  11/09/2020 upon evaluation today patient appears to be doing really about the same in regard to her wound. There is a little reopening towards the distal portion of the wound bed but again I think this is just more as a result of the dressing possibly getting stuck even to little bit of the scar tissue nonetheless it also anything looks worse although I am not sure the endoform is  really doing the job for her. I think we may want to try something a little different here I think packing with a silver alginate dressing may be optimal as compared to what we tried at this point. Is been almost a month since have seen her and we really have not seen dramatic improvement. 12/07/2020 upon evaluation today patient appears to be doing well with regard to the dorsal surface of her foot. Overall I feel like this looks to be completely healed today and this is excellent news. There is no evidence of active infection which is great news. No fevers, chills, nausea, vomiting, or diarrhea. MALEYA, LEEVER ([id number]) READMISSION 01/26/2022 This is an 88 year old woman that we had in clinic October 21 through March 22 with bilateral lower extremity wounds on the dorsal feet at that time she had pauci-immune glomerulonephritis but was not yet on dialysis. We managed to heal her out. As I remember she had severe bilateral lower extremity edema secondary to acute on chronic renal failure. She arrives in clinic this time with a wound on her distal left dorsal foot that is been there for about 6 months. Completely eschar covered she has been treating this with Neosporin. She said she is on IV vancomycin and cefepime at dialysis although we have no independent verification of this. This is due to be finished on 01/30/2022. She says it was for this foot but she does not say that she has had x-rays and MRI etc. Past medical history is extensive; she has type 2 diabetes although not currently in any treatment, end-stage renal disease on dialysis Monday Wednesday and Friday, coronary artery disease status post CABG, combined systolic and diastolic heart failure, peripheral vascular disease, cerebrovascular disease. Carotid artery stenosis, COPD, breast cancer ABI in our clinic was 0.79. I cannot find that she has had previous noninvasive arterial studies. She is only able to walk around the house does  not describe claudication that I can elicit Upon inspection patient's wounds actually appear to be doing decently well. On the left foot this is mainly dry drainage, bleeding, and Iodoflex which is stuck on the surface of the wound I was able to get that off mechanically she did not want any sharp debridement whatsoever fortunately is able to get it off mechanically without using that just using saline and gauze. Subsequently I think this is otherwise doing quite well. On the right leg she does have swelling and she had a blister that popped up this feels like it almost completely sealed down but it still weeping and leaking a little bit I think we need to manage that as well to be honest. 02-14-2022 upon evaluation today patient appears to be doing well currently in regard to the wound on her foot. She has been tolerating the dressing changes there is a little bit of film buildup here in the patient last week was very resistant to debridement because her ABIs were somewhat low I was avoiding that as well. However she did see Dr. Lazaro Arms and he apparently told her that her ABIs were good everything  appeared to be fine and to be honest I think this is excellent news. I am very pleased to hear this and I do believe doing some sharp debridement would be helpful at this point as well. 02-21-2022 upon evaluation today patient appears to be doing well with regard to her wounds. Both are showing signs of improvement this is great news. Fortunately I do not see any evidence of active infection locally or systemically at this time which is great news. No fevers, chills, nausea, vomiting, or diarrhea. 03-02-2022 upon evaluation today patient appears to be doing better in regard to her right leg which appears to be healed. In regards to her left leg this on the foot at the top still has a small opening at this time. Fortunately there does not appear to be any signs of infection. With that being said I do think we may  want to switch over to collagen based dressing to see if we get this to feeling a little bit better. Electronic Signature(s) Signed: 03/02/2022 2:50:47 PM By: Worthy Keeler PA-C Entered By: Worthy Keeler on 03/02/2022 14:50:47 [name] ([id number]) -------------------------------------------------------------------------------- Physical Exam Details Patient Name: [name] Date of Service: 03/02/2022 2:00 PM Medical Record Number: [id number] Patient Account Number: [id number] Date of Birth/Sex: 04-20-1934 (87 y.o. F) Treating RN: Carlene Coria Primary Care Provider: Fulton Reek Other Clinician: Referring Provider: Fulton Reek Treating Provider/Extender: Skipper Cliche in Treatment: 5 Constitutional Well-nourished and well-hydrated in no acute distress. Respiratory normal breathing without difficulty. Psychiatric this patient is able to make decisions and demonstrates good insight into disease process. Alert and Oriented x 3. pleasant and cooperative. Notes Upon inspection patient's wound bed actually appears to be healed on the right and on the left top of the foot she still has good tissue but this wound has not closed as quickly as we would like to have seen. Overall I think she would benefit from the use of the continue Kerlix and Coban wrap her ABIs and TBI's were excellent which is great news. She has triphasic blood flow and no her TBI's were good on the right at 1.02 and on the left at 0.79 she was noncompressible for ABIs. Electronic Signature(s) Signed: 03/02/2022 3:05:50 PM By: Worthy Keeler PA-C Entered By: Worthy Keeler on 03/02/2022 15:05:50 [name] ([id number]) -------------------------------------------------------------------------------- Physician Orders Details Patient Name: [name] Date of Service: 03/02/2022 2:00 PM Medical Record Number: [id number] Patient Account Number: [id number] Date of Birth/Sex: 03-13-1934  (87 y.o. F) Treating RN: Carlene Coria Primary Care Provider: Fulton Reek Other Clinician: Referring Provider: Fulton Reek Treating Provider/Extender: Skipper Cliche in Treatment: 5 Verbal / Phone Orders: No Diagnosis Coding ICD-10 Coding Code Description E11.621 Type 2 diabetes mellitus with foot ulcer E11.51 Type 2 diabetes mellitus with diabetic peripheral angiopathy without gangrene I12.0 Hypertensive chronic kidney disease with stage 5 chronic kidney disease or end stage renal disease L97.528 Non-pressure chronic ulcer of other part of left foot with other specified severity Follow-up Appointments o Return Appointment in 1 week. Bathing/ Shower/ Hygiene o May shower with wound dressing protected with water repellent cover or cast protector. Edema Control - Lymphedema / Segmental Compressive Device / Other o Elevate, Exercise Daily and Avoid Standing for Long Periods of Time. o Elevate legs to the level of the heart and pump ankles as often as possible o Elevate leg(s) parallel to the floor when sitting. Wound Treatment Wound #5 - Foot Wound Laterality: Dorsal, Left Primary Dressing:  Prisma 4.34 (in) 1 x Per Week/30 Days Discharge Instructions: Moisten w/normal saline or sterile water; Cover wound as directed. Do not remove from wound bed. Secondary Dressing: ABD Pad 5x9 (in/in) 1 x Per Week/30 Days Discharge Instructions: Cover with ABD pad Secondary Dressing: Kerlix 4.5 x 4.1 (in/yd) 1 x Per Week/30 Days Discharge Instructions: Apply Kerlix 4.5 x 4.1 (in/yd) as instructed Compression Wrap: coban 1 x Per Week/30 Days Electronic Signature(s) Unsigned Entered ByCarlene Coria on 03/02/2022 14:43:27 Signature(s): Date(s): [name] ([id number]) -------------------------------------------------------------------------------- Problem List Details Patient Name: [name] Date of Service: 03/02/2022 2:00 PM Medical Record Number:  [id number] Patient Account Number: [id number] Date of Birth/Sex: 1934/01/26 (87 y.o. F) Treating RN: Carlene Coria Primary Care Provider: Fulton Reek Other Clinician: Referring Provider: Fulton Reek Treating Provider/Extender: Skipper Cliche in Treatment: 5 Active Problems ICD-10 Encounter Code Description Active Date MDM Diagnosis E11.621 Type 2 diabetes mellitus with foot ulcer 01/26/2022 No Yes E11.51 Type 2 diabetes mellitus with diabetic peripheral angiopathy without 01/26/2022 No Yes gangrene I12.0 Hypertensive chronic kidney disease with stage 5 chronic kidney disease 01/26/2022 No Yes or end stage renal disease L97.528 Non-pressure chronic ulcer of other part of left foot with other specified 01/26/2022 No Yes severity Inactive Problems Resolved Problems Electronic Signature(s) Signed: 03/02/2022 2:01:12 PM By: Worthy Keeler PA-C Entered By: Worthy Keeler on 03/02/2022 14:01:12 [name] ([id number]) -------------------------------------------------------------------------------- Progress Note Details Patient Name: [name] Date of Service: 03/02/2022 2:00 PM Medical Record Number: [id number] Patient Account Number: [id number] Date of Birth/Sex: 1933/10/24 (87 y.o. F) Treating RN: Carlene Coria Primary Care Provider: Fulton Reek Other Clinician: Referring Provider: Fulton Reek Treating Provider/Extender: Skipper Cliche in Treatment: 5 Subjective Chief Complaint Information obtained from Patient 07/14/2020; patient sent over here for a hemorrhagic blister on the right dorsal foot by her primary doctor Dr. Doy Hutching at [facility]. 01/26/2022; patient referred from nephrology at dialysis for review of a wound on the left dorsal foot distally History of Present Illness (HPI) ADMISSION 07/14/2020. This is a medically complex 88 year old woman who is sent over urgently from her primary care doctor Dr. Doy Hutching at [facility] and we  worked her in this afternoon. She is apparently developed a blister on the dorsal foot sometime over the last 2 weeks on the right which seems to have developed a hemorrhagic component to this. The history here is somewhat vague but this is happened since she left the hospital on 07/02/2020. She also has a blistered area on the left dorsal foot although this does not have a hemorrhagic component to it and it is not threatened to open where is the right side is definitely leaking fluid and could easily break down. I do not believe she has been doing anything specific to this. With regards to her medical history I have reviewed the discharge summary from the hospital from 06/22/2020 through 07/02/2020. 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 She was given prednisone but was not felt to be a candidate for immunosuppression secondary to her age and comorbidities. She is ANA positive p-ANCA positive. She had a right IJ catheter placed on 9/24 and is being done on dialysis. She is a diabetic. She takes prednisone 20 mg twice daily. She was on aspirin and Plavix although I think these have been put on hold. Past medical history includes; pauci-immune crescentic glomerulonephritis. History of peripheral edema and type 2 diabetes left carotid artery  stenosis with a history of a CVA, coronary artery disease and hypertension. She states that she tolerates dialysis reasonably poorly secondary to "butt cramps". She says that when she gets discomfort they stop her dialysis. This probably has something to do with her fluid overloaded state I cannot see any arterial evaluation in her record in [facility] link. We could not do arterial ABIs on her because of complaints of discomfort 10/20; 1 week follow-up. This is a patient who is a diabetic. She recently developed acute renal failure because of pauci-immune glomerulonephritis.  Sometime during her initial hospitalization she developed a wound on her right foot tremendous swelling in both legs. She was sent over here urgently last week by her primary care physician with what looks to be hematoma still contained on the right dorsal foot. We put silver alginate on this and put her in compression to control some of the swelling. The left leg had a similar appearance with severe edema in the left dorsal foot so we put that under compression as well. When she arrived back for nurse follow-up on Friday there was some drainage coming out of the wound which our nurse cultured. This is come back showing Pseudomonas Enterococcus faecalis and staph aureus although the identification of the staph aureus is not complete. I empirically put her on cefdinir 300 mg after dialysis on Tuesday Thursday and Saturday she is only had one dose. She has not been systemically unwell 10/27 1 week follow-up. With considerable help of her nursing staff I was finally able to speak to her nephrologist who manages her at dialysis. Started her on vancomycin and ceftazidime. This to cover the combination of methicillin sensitive staph aureus Enterococcus faecalis and Pseudomonas. Although the staph aureus was methicillin sensitive and the Enterococcus ampicillin sensitive vancomycin provided the best alternative here that can be given at dialysis and ceftazidime to cover the Pseudomonas. She had a traumatic wound on the top of her foot and a complicated abscess. She has significant tissue destruction from this although the wound is looking a lot better today. We are using silver alginate to the surface. X-ray of the foot did not show any deep obvious infection or osteomyelitis 12/1; since the patient was last here she was hospitalized from 11/3 through 11/10 with coag negative staphylococcal bacteremia. I do not know the the exact cause of this was determined. She was seen by vascular and they replaced her  permacath on the right side of her neck. Her repeat blood cultures were negative. She is still on IV vancomycin ando Ceftazidime at dialysis. She generally feels systemically well she does not have a fever she is still receiving dialysis but reports she voids up to 8 times a day She has home health changing the dressing I believe they are using silver alginate under 3 layer compression. In addition to the large wound on her right anterior foot that we initially saw her for she has apparently a right lateral leg wound that she suffered during a fall and 2 skin tears on the left anterior lower leg 12/8; patient comes in looking a lot better this week. One of the 2 skin tears she has on the left leg is healed the other is smaller. The area on the right lateral leg looks improved and her original wound on the right dorsal foot is a lot smaller and looks a lot better. We used Hydrofera Blue to all wound areas under 3 layer compression.. Apparently home health had some trouble with  the compression wraps. 12/15 patient comes in with all of her wounds smaller including her original deep punched out wound on the right dorsal foot as well as the skin tears on the right posterior lateral calf and the left anterior lower leg. We have been using Hydrofera Blue under compression excellent results 10/12/2020 upon evaluation today patient appears to be doing decently well currently in regard to her foot ulcer. She does have a small hole still open with some tunneling that really goes proximally in the foot at around the 12:00 location. There is really not significant undermining other locations. This appears to be a small area of where there was a little fluid trapped. Nonetheless I do feel like this is still doing quite well and in general I think that she is going to benefit from possibly packing into this area with endoform in order to keep the area open while it granulates in. 11/09/2020 upon evaluation today  patient appears to be doing really about the same in regard to her wound. There is a little reopening towards the distal portion of the wound bed but again I think this is just more as a result of the dressing possibly getting stuck even to little bit of the scar tissue nonetheless it also anything looks worse although I am not sure the endoform is really doing the job for her. I think we may want to try LAISA, LARRICK T. ([id number]) something a little different here I think packing with a silver alginate dressing may be optimal as compared to what we tried at this point. Is been almost a month since have seen her and we really have not seen dramatic improvement. 12/07/2020 upon evaluation today patient appears to be doing well with regard to the dorsal surface of her foot. Overall I feel like this looks to be completely healed today and this is excellent news. There is no evidence of active infection which is great news. No fevers, chills, nausea, vomiting, or diarrhea. 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 We managed to heal her out. As I remember she had severe bilateral lower extremity edema secondary to acute on chronic renal failure. She arrives in clinic this time with a wound on her distal left dorsal foot that is been there for about 6 months. Completely eschar covered she has been treating this with Neosporin. She said she is on IV vancomycin and cefepime at dialysis although we have no independent verification of this. This is due to be finished on 01/30/2022. She says it was for this foot but she does not say that she has had x-rays and MRI etc. Past medical history is extensive; she has type 2 diabetes although not currently in any treatment, end-stage renal disease on dialysis  Monday Wednesday and Friday, coronary artery disease status post CABG, combined systolic and diastolic heart failure, peripheral vascular disease, cerebrovascular disease. Carotid artery stenosis, COPD, breast cancer ABI in our clinic was 0.79. I cannot find that she has had previous noninvasive arterial studies. She is only able to walk around the house does not describe claudication that I can elicit Upon inspection patient's wounds actually appear to be doing decently well. On the left foot this is mainly dry drainage, bleeding, and Iodoflex which is stuck on the surface of the wound I was  able to get that off mechanically she did not want any sharp debridement whatsoever fortunately is able to get it off mechanically without using that just using saline and gauze. Subsequently I think this is otherwise doing quite well. On the right leg she does have swelling and she had a blister that popped up this feels like it almost completely sealed down but it still weeping and leaking a little bit I think we need to manage that as well to be honest. 02-14-2022 upon evaluation today patient appears to be doing well currently in regard to the wound on her foot. She has been tolerating the dressing changes there is a little bit of film buildup here in the patient last week was very resistant to debridement because her ABIs were somewhat low I was avoiding that as well. However she did see Dr. Lazaro Arms and he apparently told her that her ABIs were good everything appeared to be fine and to be honest I think this is excellent news. I am very pleased to hear this and I do believe doing some sharp debridement would be helpful at this point as well. 02-21-2022 upon evaluation today patient appears to be doing well with regard to her wounds. Both are showing signs of improvement this is great news. Fortunately I do not see any evidence of active infection locally or systemically at this time which is great news. No  fevers, chills, nausea, vomiting, or diarrhea. 03-02-2022 upon evaluation today patient appears to be doing better in regard to her right leg which appears to be healed. In regards to her left leg this on the foot at the top still has a small opening at this time. Fortunately there does not appear to be any signs of infection. With that being said I do think we may want to switch over to collagen based dressing to see if we get this to feeling a little bit better. Objective Constitutional Well-nourished and well-hydrated in no acute distress. Vitals Time Taken: 2:14 PM, Height: 64 in, Weight: 160 lbs, BMI: 27.5, Temperature: 97.9 F, Pulse: 66 bpm, Respiratory Rate: 18 breaths/min, Blood Pressure: 138/66 mmHg. Respiratory normal breathing without difficulty. Psychiatric this patient is able to make decisions and demonstrates good insight into disease process. Alert and Oriented x 3. pleasant and cooperative. General Notes: Upon inspection patient's wound bed actually appears to be healed on the right and on the left top of the foot she still has good tissue but this wound has not closed as quickly as we would like to have seen. Overall I think she would benefit from the use of the continue Kerlix and Coban wrap her ABIs and TBI's were excellent which is great news. She has triphasic blood flow and no her TBI's were good on the right at 1.02 and on the left at 0.79 she was noncompressible for ABIs. Integumentary (Hair, Skin) Wound #5 status is Open. Original cause of wound was Gradually Appeared. The date acquired was: 08/02/2021. The wound has been in treatment 5 weeks. The wound is located on the Left,Dorsal Foot. The wound measures 0.3cm length x 0.5cm width x 0.1cm depth; 0.118cm^2 area and 0.012cm^3 volume. There is Fat Layer (Subcutaneous Tissue) exposed. There is no tunneling or undermining noted. There is a medium amount Petrizzo, Karema T. ([id number]) of serosanguineous drainage noted.  There is small (1-33%) pink granulation within the wound bed. There is a large (67-100%) amount of necrotic tissue within the wound bed including Adherent Slough. Wound #6 status is  Open. Original cause of wound was Gradually Appeared. The date acquired was: 02/07/2022. The wound has been in treatment 3 weeks. The wound is located on the Right,Proximal,Medial Lower Leg. The wound measures 0cm length x 0cm width x 0cm depth; 0cm^2 area and 0cm^3 volume. There is no tunneling or undermining noted. There is a none present amount of drainage noted. There is no granulation within the wound bed. There is no necrotic tissue within the wound bed. Assessment Active Problems ICD-10 Type 2 diabetes mellitus with foot ulcer Type 2 diabetes mellitus with diabetic peripheral angiopathy without gangrene Hypertensive chronic kidney disease with stage 5 chronic kidney disease or end stage renal disease Non-pressure chronic ulcer of other part of left foot with other specified severity Plan Follow-up Appointments: Return Appointment in 1 week. Bathing/ Shower/ Hygiene: May shower with wound dressing protected with water repellent cover or cast protector. Edema Control - Lymphedema / Segmental Compressive Device / Other: Elevate, Exercise Daily and Avoid Standing for Long Periods of Time. Elevate legs to the level of the heart and pump ankles as often as possible Elevate leg(s) parallel to the floor when sitting. WOUND #5: - Foot Wound Laterality: Dorsal, Left Primary Dressing: Prisma 4.34 (in) 1 x Per Week/30 Days Discharge Instructions: Moisten w/normal saline or sterile water; Cover wound as directed. Do not remove from wound bed. Secondary Dressing: ABD Pad 5x9 (in/in) 1 x Per Week/30 Days Discharge Instructions: Cover with ABD pad Secondary Dressing: Kerlix 4.5 x 4.1 (in/yd) 1 x Per Week/30 Days Discharge Instructions: Apply Kerlix 4.5 x 4.1 (in/yd) as instructed Compression Wrap: coban 1 x Per Week/30  Days 1. Based on what I am seeing currently I do believe that using silver collagen followed by the ABD pad along with a Kerlix and Coban wrap is still currently the right way to go. The collagen will be new hopefully this will be helpful. She really did not want to have the compression wrap on but I do believe it is helping. 2. Also can recommend we have the patient continue to monitor for any signs of worsening infection if anything changes she should let me know. We will see patient back for reevaluation in 1 week here in the clinic. If anything worsens or changes patient will contact our office for additional recommendations. Electronic Signature(s) Signed: 03/02/2022 3:11:42 PM By: Worthy Keeler PA-C Entered By: Worthy Keeler on 03/02/2022 15:11:42 [name] ([id number]) -------------------------------------------------------------------------------- SuperBill Details Patient Name: [name] Date of Service: 03/02/2022 Medical Record Number: [id number] Patient Account Number: [id number] Date of Birth/Sex: 20-Apr-1934 (87 y.o. F) Treating RN: Carlene Coria Primary Care Provider: Fulton Reek Other Clinician: Referring Provider: Fulton Reek Treating Provider/Extender: Skipper Cliche in Treatment: 5 Diagnosis Coding ICD-10 Codes Code Description E11.621 Type 2 diabetes mellitus with foot ulcer E11.51 Type 2 diabetes mellitus with diabetic peripheral angiopathy without gangrene I12.0 Hypertensive chronic kidney disease with stage 5 chronic kidney disease or end stage renal disease L97.528 Non-pressure chronic ulcer of other part of left foot with other specified severity Facility Procedures CPT4 Code: [id number] Description: 94854 - WOUND CARE VISIT-LEV 2 EST PT Modifier: Quantity: 1 Physician Procedures CPT4 Code Description: [id number] - WC PHYS LEVEL 3 - EST PT Modifier: Quantity: 1 CPT4 Code Description: ICD-10 Diagnosis Description E11.621 Type 2  diabetes mellitus with foot ulcer E11.51 Type 2 diabetes mellitus with diabetic peripheral angiopathy without ga I12.0 Hypertensive chronic kidney disease with stage 5 chronic kidney  disease L97.528 Non-pressure chronic ulcer  of other part of left foot with other specif Modifier: ngrene or end stage renal d ied severity Quantity: isease Electronic Signature(s) Signed: 03/02/2022 3:12:03 PM By: Worthy Keeler PA-C Entered By: Worthy Keeler on 03/02/2022 15:12:03

## 2022-03-02 NOTE — Progress Notes (Signed)
[name], [name] ([id number]) Visit Report for 03/02/2022 Arrival Information Details [name], [name] Date of Service: 03/02/2022 2:00 PM Medical Record Number: [id number] Patient Account Number: [id number] Date of Birth/Sex: 04/21/1934 (87 y.o. F) Treating RN: Carlene Coria Primary Care Erika Hussar: Fulton Reek Other Clinician: Referring Madix Blowe: Fulton Reek Treating Sunya Humbarger/Extender: Skipper Cliche in Treatment: 5 Visit Information History Since Last Visit All ordered tests and consults were completed: No Patient Arrived: Ambulatory Added or deleted any medications: No Arrival Time: 14:09 Any new allergies or adverse reactions: No Accompanied By: wife Had a fall or experienced change in No Transfer Assistance: None activities of daily living that may affect Patient Identification Verified: Yes risk of falls: Secondary Verification Process Completed: Yes Signs or symptoms of abuse/neglect since last visito No Patient Requires Transmission-Based No Hospitalized since last visit: No Precautions: Implantable device outside of the clinic excluding No Patient Has Alerts: Yes cellular tissue based products placed in the center Patient Alerts: Patient on Blood Thinner since last visit: ABI R/L [place] 02/16/22 Has Dressing in Place as Prescribed: Yes TBI 02/16/22 R 1.02 L Has Compression in Place as Prescribed: Yes 0.79 Pain Present Now: No Electronic Signature(s) Unsigned Entered ByCarlene Coria on 03/02/2022 14:14:27 Signature(s): Date(s): [name] ([id number]) -------------------------------------------------------------------------------- Clinic Level of Care Assessment Details [name], [name] Date of Service: 03/02/2022 2:00 PM Medical Record Number: [id number] Patient Account Number: [id number] Date of Birth/Sex: 1934/06/05 (87 y.o. F) Treating RN: Carlene Coria Primary Care Giliana Vantil: Fulton Reek Other  Clinician: Referring Ginelle Bays: Fulton Reek Treating Klaira Pesci/Extender: Skipper Cliche in Treatment: 5 Clinic Level of Care Assessment Items TOOL 4 Quantity Score X - Use when only an EandM is performed on FOLLOW-UP visit 1 0 ASSESSMENTS - Nursing Assessment / Reassessment X - Reassessment of Co-morbidities (includes updates in patient status) 1 10 X- 1 5 Reassessment of Adherence to Treatment Plan ASSESSMENTS - Wound and Skin Assessment / Reassessment []  - Simple Wound Assessment / Reassessment - one wound 0 X- 1 5 Complex Wound Assessment / Reassessment - multiple wounds []  - 0 Dermatologic / Skin Assessment (not related to wound area) ASSESSMENTS - Focused Assessment []  - Circumferential Edema Measurements - multi extremities 0 []  - 0 Nutritional Assessment / Counseling / Intervention []  - 0 Lower Extremity Assessment (monofilament, tuning fork, pulses) []  - 0 Peripheral Arterial Disease Assessment (using hand held doppler) ASSESSMENTS - Ostomy and/or Continence Assessment and Care []  - Incontinence Assessment and Management 0 []  - 0 Ostomy Care Assessment and Management (repouching, etc.) PROCESS - Coordination of Care X - Simple Patient / Family Education for ongoing care 1 15 []  - 0 Complex (extensive) Patient / Family Education for ongoing care []  - 0 Staff obtains Programmer, systems, Records, Test Results / Process Orders []  - 0 Staff telephones HHA, Nursing Homes / Clarify orders / etc []  - 0 Routine Transfer to another Facility (non-emergent condition) []  - 0 Routine Hospital Admission (non-emergent condition) []  - 0 New Admissions / Biomedical engineer / Ordering NPWT, Apligraf, etc. []  - 0 Emergency Hospital Admission (emergent condition) X- 1 10 Simple Discharge Coordination []  - 0 Complex (extensive) Discharge Coordination PROCESS - Special Needs []  - Pediatric / Minor Patient Management 0 []  - 0 Isolation Patient Management []  - 0 Hearing /  Language / Visual special needs []  - 0 Assessment of Community assistance (transportation, D/C planning, etc.) []  - 0 Additional assistance / Altered mentation []  - 0 Support Surface(s) Assessment (bed, cushion, seat, etc.)  INTERVENTIONS - Wound Cleansing / Measurement Forni, Merrit T. ([id number]) X- 1 5 Simple Wound Cleansing - one wound []  - 0 Complex Wound Cleansing - multiple wounds X- 1 5 Wound Imaging (photographs - any number of wounds) []  - 0 Wound Tracing (instead of photographs) X- 1 5 Simple Wound Measurement - one wound []  - 0 Complex Wound Measurement - multiple wounds INTERVENTIONS - Wound Dressings X - Small Wound Dressing one or multiple wounds 1 10 []  - 0 Medium Wound Dressing one or multiple wounds []  - 0 Large Wound Dressing one or multiple wounds []  - 0 Application of Medications - topical []  - 0 Application of Medications - injection INTERVENTIONS - Miscellaneous []  - External ear exam 0 []  - 0 Specimen Collection (cultures, biopsies, blood, body fluids, etc.) []  - 0 Specimen(s) / Culture(s) sent or taken to Lab for analysis []  - 0 Patient Transfer (multiple staff / Civil Service fast streamer / Similar devices) []  - 0 Simple Staple / Suture removal (25 or less) []  - 0 Complex Staple / Suture removal (26 or more) []  - 0 Hypo / Hyperglycemic Management (close monitor of Blood Glucose) []  - 0 Ankle / Brachial Index (ABI) - do not check if billed separately X- 1 5 Vital Signs Has the patient been seen at the hospital within the last three years: Yes Total Score: 75 Level Of Care: New/Established - Level 2 Electronic Signature(s) Unsigned Entered ByCarlene Coria on 03/02/2022 14:42:39 Signature(s): Date(s): [name] ([id number]) -------------------------------------------------------------------------------- Encounter Discharge Information Details [name] Date of Service: 03/02/2022 2:00 PM Medical Record Number:  [id number] Patient Account Number: [id number] Date of Birth/Sex: [birth year] (87 y.o. F) Treating RN: Carlene Coria Primary Care Tami Barren: Fulton Reek Other Clinician: Referring Ankit Degregorio: Fulton Reek Treating Akia Montalban/Extender: Skipper Cliche in Treatment: 5 Encounter Discharge Information Items Discharge Condition: Stable Ambulatory Status: Ambulatory Discharge Destination: Home Transportation: Private Auto Accompanied By: self Schedule Follow-up Appointment: Yes Clinical Summary of Care: Patient Declined Electronic Signature(s) Unsigned Entered ByCarlene Coria on 03/02/2022 14:44:28 Signature(s): Date(s): [name] ([id number]) -------------------------------------------------------------------------------- Lower Extremity Assessment Details [name], [name] Date of Service: 03/02/2022 2:00 PM Medical Record Number: [id number] Patient Account Number: [id number] Date of Birth/Sex: January 19, 1934 (87 y.o. F) Treating RN: Carlene Coria Primary Care Dashauna Heymann: Fulton Reek Other Clinician: Referring Nayanna Seaborn: Fulton Reek Treating Kasen Sako/Extender: Skipper Cliche in Treatment: 5 Edema Assessment Assessed: [Left: No] [Right: No] Edema: [Left: Yes] [Right: Yes] Calf Left: Right: Point of Measurement: 30 cm From Medial Instep 34 cm 37 cm Ankle Left: Right: Point of Measurement: 10 cm From Medial Instep 28 cm 31 cm Vascular Assessment Pulses: Dorsalis Pedis Palpable: [Left:Yes] [Right:Yes] Electronic Signature(s) Unsigned Entered ByCarlene Coria on 03/02/2022 14:23:47 Signature(s): Date(s): [name] ([id number]) -------------------------------------------------------------------------------- Multi Wound Chart Details [name] Date of Service: 03/02/2022 2:00 PM Medical Record Number: [id number] Patient Account Number: [id number] Date of Birth/Sex: 1934/04/16 (87 y.o. 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] [6:Skin Tear] [N/A:N/A] Comorbid History: [5:Congestive Heart Failure, Hypertension, Type II Diabetes, Received Radiation] [6:Congestive Heart Failure,  Hypertension, Type II Diabetes, Received Radiation] [N/A:N/A] Date Acquired: [5:08/02/2021] [6:02/07/2022] [N/A:N/A] Weeks of Treatment: [5:5] [6:3] [N/A:N/A] Wound Status: [5:Open] [6:Open] [N/A:N/A] Wound Recurrence: [5:No] [6:No] [N/A:N/A] Measurements L x W x D (cm) [5:0.3x0.5x0.1] [6:0x0x0] [N/A:N/A] Area (cm) : [5:0.118] [6:0] [N/A:N/A] Volume (cm) : [5:0.012] [6:0] [N/A:N/A] % Reduction in Area: [5:91.20%] [6:100.00%] [N/A:N/A] % Reduction in Volume: [5:91.00%] [6:100.00%] [N/A:N/A] Classification: [5:Grade 2] [6:Full Thickness Without Exposed Support Structures] [N/A:N/A] Exudate Amount: [5:Medium] [6:None Present] [N/A:N/A] Exudate Type: [5:Serosanguineous] [6:N/A] [N/A:N/A] Exudate Color: [5:red, brown] [6:N/A] [N/A:N/A] Granulation Amount: [5:Small (1-33%)] [6:None Present (0%)] [N/A:N/A] Granulation Quality: [5:Pink] [6:N/A] [N/A:N/A] Necrotic Amount: [5:Large (67-100%)] [6:None Present (0%)] [N/A:N/A] Exposed Structures: [5:Fat Layer (Subcutaneous Tissue): Yes Fascia: No Tendon: No Muscle: No Joint: No Bone: No Small (1-33%)] [6:Fat Layer (Subcutaneous Tissue): No Large (67-100%)] [N/A:N/A N/A] Treatment Notes Electronic  Signature(s) Unsigned Entered ByCarlene Coria on 03/02/2022 14:23:59 Signature(s): Date(s): [name] ([id number]) -------------------------------------------------------------------------------- Rose Details [name] Date of Service: 03/02/2022 2:00 PM Medical Record Number: [id number] Patient Account Number: [id number] Date of Birth/Sex: [birth year] (87 y.o. F) Treating RN: Carlene Coria Primary Care Zubin Pontillo: Fulton Reek Other Clinician: Referring Loren Vicens: Fulton Reek Treating Remington Skalsky/Extender: Skipper Cliche in Treatment: 5 Active Inactive Wound/Skin Impairment Nursing Diagnoses: Knowledge deficit related to ulceration/compromised skin integrity Goals: Patient/caregiver will verbalize understanding of skin care regimen Date Initiated: 01/26/2022 Date Inactivated: 02/14/2022 Target Resolution Date: 02/25/2022 Goal Status: Met Ulcer/skin breakdown will have a volume reduction of 30% by week 4 Date Initiated: 01/26/2022 Target Resolution Date: 02/25/2022 Goal Status: Active Ulcer/skin breakdown will have a volume reduction of 50% by week 8 Date Initiated: 01/26/2022 Target Resolution Date: 03/28/2022 Goal Status: Active Ulcer/skin breakdown will have a volume reduction of 80% by week 12 Date Initiated: 01/26/2022 Target Resolution Date: 04/27/2022 Goal Status: Active Ulcer/skin breakdown will heal within 14 weeks Date Initiated: 01/26/2022 Target Resolution Date: 05/28/2022 Goal Status: Active Interventions: Assess patient/caregiver ability to obtain necessary supplies Assess patient/caregiver ability to perform ulcer/skin care regimen upon admission and as needed Assess ulceration(s) every visit Notes: Electronic Signature(s) Unsigned Entered By: Carlene Coria on 03/02/2022 14:23:52 Signature(s): Date(s): [name]  ([id number]) -------------------------------------------------------------------------------- Pain Assessment Details [name] Date of Service: 03/02/2022 2:00 PM Medical Record Number: [id number] Patient Account Number: [id number] Date of Birth/Sex: Dec 12, 1933 (88 y.o. F) Treating RN: Carlene Coria Primary Care Lateasha Breuer: Fulton Reek Other Clinician: Referring Shantel Helwig: Fulton Reek Treating Raguel Kosloski/Extender: Skipper Cliche in Treatment: 5 Active Problems Location of Pain Severity and Description of Pain Patient Has Paino No Site Locations Pain Management and Medication Current Pain Management: Electronic Signature(s) Unsigned Entered ByCarlene Coria on 03/02/2022 14:15:51 Signature(s): Date(s): [name] ([id number]) -------------------------------------------------------------------------------- Patient/Caregiver Education Details [name] Date of Service: 03/02/2022 2:00 PM Medical Record Number: [id number] Patient Account Number: [id number] Date of Birth/Gender: 05/15/1934 (87 y.o. F) Treating RN: Carlene Coria Primary Care Physician: Fulton Reek Other Clinician: Referring Physician: Fulton Reek Treating Physician/Extender: Skipper Cliche in Treatment: 5 Education Assessment Education Provided To: Patient Education Topics Provided Wound/Skin Impairment: Methods: Printed Responses: State content correctly Electronic Signature(s) Unsigned Entered ByCarlene Coria on 03/02/2022 14:43:49 Signature(s): Date(s): [name] ([id number]) -------------------------------------------------------------------------------- Wound Assessment Details [name] Date of Service: 03/02/2022 2:00 PM Medical Record Number: [id number] Patient Account Number: [id number] Date of Birth/Sex: 20-Apr-1934 (87 y.o. F) Treating RN: Carlene Coria Primary Care Orion Mole: Fulton Reek Other  Clinician: Referring Mykia Holton: Fulton Reek Treating Baird Polinski/Extender:  Jeri Cos Weeks in Treatment: 5 Wound Status Wound Number: 5 Primary Diabetic Wound/Ulcer of the Lower Extremity Etiology: Wound Location: Left, Dorsal Foot Wound Status: Open Wounding Event: Gradually Appeared Comorbid Congestive Heart Failure, Hypertension, Type II Date Acquired: 08/02/2021 History: Diabetes, Received Radiation Weeks Of Treatment: 5 Clustered Wound: No Wound Measurements Length: (cm) 0.3 Width: (cm) 0.5 Depth: (cm) 0.1 Area: (cm) 0.118 Volume: (cm) 0.012 % Reduction in Area: 91.2% % Reduction in Volume: 91% Epithelialization: Small (1-33%) Tunneling: No Undermining: No Wound Description Classification: Grade 2 Exudate Amount: Medium Exudate Type: Serosanguineous Exudate Color: red, brown Foul Odor After Cleansing: No Slough/Fibrino Yes Wound Bed Granulation Amount: Small (1-33%) Exposed Structure Granulation Quality: Pink Fascia Exposed: No Necrotic Amount: Large (67-100%) Fat Layer (Subcutaneous Tissue) Exposed: Yes Necrotic Quality: Adherent Slough Tendon Exposed: No Muscle Exposed: No Joint Exposed: No Bone Exposed: No Treatment Notes Wound #5 (Foot) Wound Laterality: Dorsal, Left Cleanser Peri-Wound Care Topical Primary Dressing Prisma 4.34 (in) Discharge Instruction: Moisten w/normal saline or sterile water; Cover wound as directed. Do not remove from wound bed. Secondary Dressing ABD Pad 5x9 (in/in) Discharge Instruction: Cover with ABD pad Kerlix 4.5 x 4.1 (in/yd) Discharge Instruction: Apply Kerlix 4.5 x 4.1 (in/yd) as instructed Secured With Compression Wrap coban Compression Stockings [name], [name] ([id number]) Add-Ons Electronic Signature(s) Unsigned Entered By: Carlene Coria on 03/02/2022 14:23:10 Signature(s): Date(s): [name] ([id number]) -------------------------------------------------------------------------------- Wound  Assessment Details [name] Date of Service: 03/02/2022 2:00 PM Medical Record Number: [id number] Patient Account Number: [id number] Date of Birth/Sex: [birth year] (87 y.o. F) Treating RN: Carlene Coria Primary Care Elodie Panameno: Fulton Reek Other Clinician: Referring Martita Brumm: Fulton Reek Treating Orry Sigl/Extender: Skipper Cliche in Treatment: 5 Wound Status Wound Number: 6 Primary Skin Tear Etiology: Wound Location: Right, Proximal, Medial Lower Leg Wound Status: Open Wounding Event: Gradually Appeared Notes: significant swelling in leg Date Acquired: 02/07/2022 Comorbid Congestive Heart Failure, Hypertension, Type II Weeks Of Treatment: 3 History: Diabetes, Received Radiation Clustered Wound: No Wound Measurements Length: (cm) 0 Width: (cm) 0 Depth: (cm) 0 Area: (cm) 0 Volume: (cm) 0 % Reduction in Area: 100% % Reduction in Volume: 100% Epithelialization: Large (67-100%) Tunneling: No Undermining: No Wound Description Classification: Full Thickness Without Exposed Support Structure Exudate Amount: None Present s Foul Odor After Cleansing: No Slough/Fibrino No Wound Bed Granulation Amount: None Present (0%) Exposed Structure Necrotic Amount: None Present (0%) Fat Layer (Subcutaneous Tissue) Exposed: No Electronic Signature(s) Unsigned Entered ByCarlene Coria on 03/02/2022 14:23:22 Signature(s): Date(s): [name] ([id number]) -------------------------------------------------------------------------------- Vitals Details [name] Date of Service: 03/02/2022 2:00 PM Medical Record Number: [id number] Patient Account Number: [id number] Date of Birth/Sex: 08/02/1934 (87 y.o. F) Treating RN: Carlene Coria Primary Care Ronne Savoia: Fulton Reek Other Clinician: Referring Kosta Schnitzler: Fulton Reek Treating Camy Leder/Extender: Skipper Cliche in Treatment: 5 Vital Signs Time Taken: 14:14 Temperature (F):  97.9 Height (in): 64 Pulse (bpm): 66 Weight (lbs): 160 Respiratory Rate (breaths/min): 18 Body Mass Index (BMI): 27.5 Blood Pressure (mmHg): 138/66 Reference Range: 80 - 120 mg / dl Electronic Signature(s) Unsigned Entered ByCarlene Coria on 03/02/2022 14:15:19 Signature(s): Date(s):

## 2022-03-09 ENCOUNTER — Encounter: Payer: Medicare Other | Admitting: Physician Assistant

## 2022-03-09 DIAGNOSIS — E1151 Type 2 diabetes mellitus with diabetic peripheral angiopathy without gangrene: Secondary | ICD-10-CM | POA: Diagnosis not present

## 2022-03-09 NOTE — Progress Notes (Addendum)
[name], [name] ([id number]) Visit Report for 03/09/2022 Arrival Information Details [name], [name] Date of Service: 03/09/2022 12:15 PM Medical Record Number: [id number] Patient Account Number: [id number] Date of Birth/Sex: 05-19-1934 (88 y.o. F) Treating RN: Levora Dredge Primary Care Aiyanna Awtrey: Fulton Reek Other Clinician: Referring Ayub Kirsh: Fulton Reek Treating Marlee Trentman/Extender: Skipper Cliche in Treatment: 6 Visit Information History Since Last Visit All ordered tests and consults were completed: No Patient Arrived: Wheel Chair Added or deleted any medications: No Arrival Time: 12:33 Any new allergies or adverse reactions: No Accompanied By: husband Had a fall or experienced change in No Transfer Assistance: None activities of daily living that may affect Patient Requires Transmission-Based No risk of falls: Precautions: Signs or symptoms of abuse/neglect since last visito No Patient Has Alerts: Yes Hospitalized since last visit: No Patient Alerts: Patient on Blood Thinner Implantable device outside of the clinic excluding No ABI R/L [place] 02/16/22 cellular tissue based products placed in the center TBI 02/16/22 R 1.02 L since last visit: 0.79 Pain Present Now: No Electronic Signature(s) Signed: 03/09/2022 4:11:43 PM By: Worthy Keeler PA-C Entered By: Worthy Keeler on 03/09/2022 12:33:55 [name] ([id number]) -------------------------------------------------------------------------------- Clinic Level of Care Assessment Details [name] Date of Service: 03/09/2022 12:15 PM Medical Record Number: [id number] Patient Account Number: [id number] Date of Birth/Sex: 1934/07/28 (87 y.o. F) Treating RN: Levora Dredge Primary Care Mariely Mahr: Fulton Reek Other Clinician: Referring Kahari Critzer: Fulton Reek Treating Khyli Swaim/Extender: Skipper Cliche in Treatment: 6 Clinic Level of Care Assessment Items TOOL  4 Quantity Score []  - Use when only an EandM is performed on FOLLOW-UP visit 0 ASSESSMENTS - Nursing Assessment / Reassessment X - Reassessment of Co-morbidities (includes updates in patient status) 1 10 X- 1 5 Reassessment of Adherence to Treatment Plan ASSESSMENTS - Wound and Skin Assessment / Reassessment X - Simple Wound Assessment / Reassessment - one wound 1 5 []  - 0 Complex Wound Assessment / Reassessment - multiple wounds []  - 0 Dermatologic / Skin Assessment (not related to wound area) ASSESSMENTS - Focused Assessment X - Circumferential Edema Measurements - multi extremities 1 5 []  - 0 Nutritional Assessment / Counseling / Intervention []  - 0 Lower Extremity Assessment (monofilament, tuning fork, pulses) []  - 0 Peripheral Arterial Disease Assessment (using hand held doppler) ASSESSMENTS - Ostomy and/or Continence Assessment and Care []  - Incontinence Assessment and Management 0 []  - 0 Ostomy Care Assessment and Management (repouching, etc.) PROCESS - Coordination of Care X - Simple Patient / Family Education for ongoing care 1 15 []  - 0 Complex (extensive) Patient / Family Education for ongoing care []  - 0 Staff obtains Programmer, systems, Records, Test Results / Process Orders []  - 0 Staff telephones HHA, Nursing Homes / Clarify orders / etc []  - 0 Routine Transfer to another Facility (non-emergent condition) []  - 0 Routine Hospital Admission (non-emergent condition) []  - 0 New Admissions / Biomedical engineer / Ordering NPWT, Apligraf, etc. []  - 0 Emergency Hospital Admission (emergent condition) X- 1 10 Simple Discharge Coordination []  - 0 Complex (extensive) Discharge Coordination PROCESS - Special Needs []  - Pediatric / Minor Patient Management 0 []  - 0 Isolation Patient Management []  - 0 Hearing / Language / Visual special needs []  - 0 Assessment of Community assistance (transportation, D/C planning, etc.) []  - 0 Additional assistance / Altered  mentation []  - 0 Support Surface(s) Assessment (bed, cushion, seat, etc.) INTERVENTIONS - Wound Cleansing / Measurement [name], Sarah T. ([id number]) X- 1 5 Simple  Wound Cleansing - one wound []  - 0 Complex Wound Cleansing - multiple wounds X- 1 5 Wound Imaging (photographs - any number of wounds) []  - 0 Wound Tracing (instead of photographs) X- 1 5 Simple Wound Measurement - one wound []  - 0 Complex Wound Measurement - multiple wounds INTERVENTIONS - Wound Dressings X - Small Wound Dressing one or multiple wounds 1 10 []  - 0 Medium Wound Dressing one or multiple wounds []  - 0 Large Wound Dressing one or multiple wounds []  - 0 Application of Medications - topical []  - 0 Application of Medications - injection INTERVENTIONS - Miscellaneous []  - External ear exam 0 []  - 0 Specimen Collection (cultures, biopsies, blood, body fluids, etc.) []  - 0 Specimen(s) / Culture(s) sent or taken to Lab for analysis []  - 0 Patient Transfer (multiple staff / Civil Service fast streamer / Similar devices) []  - 0 Simple Staple / Suture removal (25 or less) []  - 0 Complex Staple / Suture removal (26 or more) []  - 0 Hypo / Hyperglycemic Management (close monitor of Blood Glucose) []  - 0 Ankle / Brachial Index (ABI) - do not check if billed separately X- 1 5 Vital Signs Has the patient been seen at the hospital within the last three years: Yes Total Score: 80 Level Of Care: New/Established - Level 3 Electronic Signature(s) Signed: 03/09/2022 4:11:43 PM By: Worthy Keeler PA-C Entered By: Worthy Keeler on 03/09/2022 12:56:21 [name] ([id number]) -------------------------------------------------------------------------------- Encounter Discharge Information Details [name] Date of Service: 03/09/2022 12:15 PM Medical Record Number: [id number] Patient Account Number: [id number] Date of Birth/Sex: July 19, 1934 (87 y.o. F) Treating RN: Levora Dredge Primary Care  Jacy Howat: Fulton Reek Other Clinician: Referring Cierria Height: Fulton Reek Treating Abhijay Morriss/Extender: Skipper Cliche in Treatment: 6 Encounter Discharge Information Items Discharge Condition: Stable Ambulatory Status: Wheelchair Discharge Destination: Home Transportation: Private Auto Accompanied By: husband Schedule Follow-up Appointment: Yes Clinical Summary of Care: Electronic Signature(s) Signed: 03/09/2022 4:11:43 PM By: Worthy Keeler PA-C Entered By: Worthy Keeler on 03/09/2022 12:57:07 [name] ([id number]) -------------------------------------------------------------------------------- Lower Extremity Assessment Details [name] Date of Service: 03/09/2022 12:15 PM Medical Record Number: [id number] Patient Account Number: [id number] Date of Birth/Sex: March 15, 1934 (87 y.o. F) Treating RN: Levora Dredge Primary Care Devarius Nelles: Fulton Reek Other Clinician: Referring Sanjeev Main: Fulton Reek Treating Glorianna Gott/Extender: Skipper Cliche in Treatment: 6 Edema Assessment Assessed: [Left: No] [Right: No] Edema: [Left: Yes] [Right: Yes] Calf Left: Right: Point of Measurement: 30 cm From Medial Instep 33 cm Ankle Left: Right: Point of Measurement: 10 cm From Medial Instep 27.2 cm Vascular Assessment Pulses: Dorsalis Pedis Palpable: [Left:Yes] [Right:Yes] Notes pedal pulse slight Electronic Signature(s) Signed: 03/09/2022 1:44:13 PM By: Levora Dredge Signed: 03/09/2022 4:11:43 PM By: Worthy Keeler PA-C Entered By: Worthy Keeler on 03/09/2022 12:45:31 [name] ([id number]) -------------------------------------------------------------------------------- Multi Wound Chart Details [name] Date of Service: 03/09/2022 12:15 PM Medical Record Number: [id number] Patient Account Number: [id number] Date of Birth/Sex: 30-Oct-1933 (88 y.o. F) Treating RN: Levora Dredge Primary Care Elleanna Melling:  Fulton Reek Other Clinician: Referring Rosemond Lyttle: Fulton Reek Treating Paul Torpey/Extender: Skipper Cliche in Treatment: 6 Vital Signs Height(in): 64 Pulse(bpm): 3 Weight(lbs): 160 Blood Pressure(mmHg): 157/70 Body Mass Index(BMI): 27.5 Temperature(F): 98.0 Respiratory Rate(breaths/min): 16 Photos: [N/A:N/A] Wound Location: Left, Dorsal Foot N/A N/A Wounding Event: Gradually Appeared N/A N/A Primary Etiology: Diabetic Wound/Ulcer of the Lower N/A N/A Extremity Comorbid History: Congestive Heart Failure, N/A N/A Hypertension, Type II Diabetes, Received  Radiation Date Acquired: 08/02/2021 N/A N/A Weeks of Treatment: 6 N/A N/A Wound Status: Open N/A N/A Wound Recurrence: No N/A N/A Measurements L x W x D (cm) 0.4x0.4x0.2 N/A N/A Area (cm) : 0.126 N/A N/A Volume (cm) : 0.025 N/A N/A % Reduction in Area: 90.60% N/A N/A % Reduction in Volume: 81.30% N/A N/A Classification: Grade 2 N/A N/A Exudate Amount: Medium N/A N/A Exudate Type: Serosanguineous N/A N/A Exudate Color: red, brown N/A N/A Granulation Amount: Medium (34-66%) N/A N/A Granulation Quality: Pink N/A N/A Necrotic Amount: Medium (34-66%) N/A N/A Exposed Structures: Fat Layer (Subcutaneous Tissue): N/A N/A Yes Fascia: No Tendon: No Muscle: No Joint: No Bone: No Epithelialization: Small (1-33%) N/A N/A Treatment Notes Wound #5 (Foot) Wound Laterality: Dorsal, Left Cleanser Soap and Water Discharge Instruction: Gently cleanse wound with antibacterial soap, rinse and pat dry prior to dressing wounds Peri-Wound Care [name], [name] ([id number]) Topical Primary Dressing Xeroform 5x9-HBD (in/in) Discharge Instruction: Apply Xeroform 5x9-HBD (in/in) as directed Secondary Dressing Coverlet Latex-Free Fabric Adhesive Dressings Discharge Instruction: 1.5 x 2 Secured With Tubigrip Size D, 3x10 (in/yd) Compression Wrap Compression Stockings Add-Ons Electronic Signature(s) Signed: 03/09/2022  4:11:43 PM By: Worthy Keeler PA-C Entered By: Worthy Keeler on 03/09/2022 12:58:13 [name] ([id number]) -------------------------------------------------------------------------------- [place] Details [name] Date of Service: 03/09/2022 12:15 PM Medical Record Number: [id number] Patient Account Number: [id number] Date of Birth/Sex: March 25, 1934 (87 y.o. F) Treating RN: Levora Dredge Primary Care Jae Bruck: Fulton Reek Other Clinician: Referring Takota Cahalan: Fulton Reek Treating Bobbi Kozakiewicz/Extender: Skipper Cliche in Treatment: 6 Active Inactive Wound/Skin Impairment Nursing Diagnoses: Knowledge deficit related to ulceration/compromised skin integrity Goals: Patient/caregiver will verbalize understanding of skin care regimen Date Initiated: 01/26/2022 Date Inactivated: 02/14/2022 Target Resolution Date: 02/25/2022 Goal Status: Met Ulcer/skin breakdown will have a volume reduction of 30% by week 4 Date Initiated: 01/26/2022 Target Resolution Date: 02/25/2022 Goal Status: Active Ulcer/skin breakdown will have a volume reduction of 50% by week 8 Date Initiated: 01/26/2022 Target Resolution Date: 03/28/2022 Goal Status: Active Ulcer/skin breakdown will have a volume reduction of 80% by week 12 Date Initiated: 01/26/2022 Target Resolution Date: 04/27/2022 Goal Status: Active Ulcer/skin breakdown will heal within 14 weeks Date Initiated: 01/26/2022 Target Resolution Date: 05/28/2022 Goal Status: Active Interventions: Assess patient/caregiver ability to obtain necessary supplies Assess patient/caregiver ability to perform ulcer/skin care regimen upon admission and as needed Assess ulceration(s) every visit Notes: Electronic Signature(s) Signed: 03/09/2022 1:44:13 PM By: Levora Dredge Signed: 03/09/2022 4:11:43 PM By: Worthy Keeler PA-C Entered By: Worthy Keeler on 03/09/2022 12:58:06 [name]  ([id number]) -------------------------------------------------------------------------------- Pain Assessment Details [name] Date of Service: 03/09/2022 12:15 PM Medical Record Number: [id number] Patient Account Number: [id number] Date of Birth/Sex: April 19, 1934 (88 y.o. F) Treating RN: Levora Dredge Primary Care Remiel Corti: Fulton Reek Other Clinician: Referring Dyann Goodspeed: Fulton Reek Treating Caroline Matters/Extender: Skipper Cliche in Treatment: 6 Active Problems Location of Pain Severity and Description of Pain Patient Has Paino No Site Locations With Dressing Change: No Rate the pain. Current Pain Level: 0 Pain Management and Medication Current Pain Management: Electronic Signature(s) Signed: 03/09/2022 1:44:13 PM By: Levora Dredge Signed: 03/09/2022 4:11:43 PM By: Worthy Keeler PA-C Entered By: Worthy Keeler on 03/09/2022 12:34:44 [name] ([id number]) -------------------------------------------------------------------------------- Patient/Caregiver Education Details [name] Date of Service: 03/09/2022 12:15 PM Medical Record Number: [id number] Patient Account Number: [id number] Date of Birth/Gender: May 03, 1934 (88 y.o. F) Treating RN: Levora Dredge Primary Care Physician:  Fulton Reek Other Clinician: Referring Physician: Fulton Reek Treating Physician/Extender: Skipper Cliche in Treatment: 6 Education Assessment Education Provided To: Patient Education Topics Provided Wound/Skin Impairment: Handouts: Caring for Your Ulcer Methods: Explain/Verbal Responses: State content correctly Electronic Signature(s) Signed: 03/09/2022 4:11:43 PM By: Worthy Keeler PA-C Entered By: Worthy Keeler on 03/09/2022 12:56:36 [name] ([id number]) -------------------------------------------------------------------------------- Wound Assessment Details [name] Date of Service:  03/09/2022 12:15 PM Medical Record Number: [id number] Patient Account Number: [id number] Date of Birth/Sex: 03-17-1934 (87 y.o. F) Treating RN: Levora Dredge Primary Care Natascha Edmonds: Fulton Reek Other Clinician: Referring Smt. Loder: Fulton Reek Treating Temitayo Covalt/Extender: Skipper Cliche in Treatment: 6 Wound Status Wound Number: 5 Primary Diabetic Wound/Ulcer of the Lower Extremity Etiology: Wound Location: Left, Dorsal Foot Wound Status: Open Wounding Event: Gradually Appeared Comorbid Congestive Heart Failure, Hypertension, Type II Date Acquired: 08/02/2021 History: Diabetes, Received Radiation Weeks Of Treatment: 6 Clustered Wound: No Photos Wound Measurements Length: (cm) 0.4 Width: (cm) 0.4 Depth: (cm) 0.2 Area: (cm) 0.126 Volume: (cm) 0.025 % Reduction in Area: 90.6% % Reduction in Volume: 81.3% Epithelialization: Small (1-33%) Tunneling: No Undermining: No Wound Description Classification: Grade 2 Exudate Amount: Medium Exudate Type: Serosanguineous Exudate Color: red, brown Foul Odor After Cleansing: No Slough/Fibrino Yes Wound Bed Granulation Amount: Medium (34-66%) Exposed Structure Granulation Quality: Pink Fascia Exposed: No Necrotic Amount: Medium (34-66%) Fat Layer (Subcutaneous Tissue) Exposed: Yes Necrotic Quality: Adherent Slough Tendon Exposed: No Muscle Exposed: No Joint Exposed: No Bone Exposed: No Treatment Notes Wound #5 (Foot) Wound Laterality: Dorsal, Left Cleanser Soap and Water Discharge Instruction: Gently cleanse wound with antibacterial soap, rinse and pat dry prior to dressing wounds Peri-Wound Care [name], [name] ([id number]) Topical Primary Dressing Xeroform 5x9-HBD (in/in) Discharge Instruction: Apply Xeroform 5x9-HBD (in/in) as directed Secondary Dressing Coverlet Latex-Free Fabric Adhesive Dressings Discharge Instruction: 1.5 x 2 Secured With Tubigrip Size D, 3x10 (in/yd) Compression Wrap Compression  Stockings Add-Ons Electronic Signature(s) Signed: 03/09/2022 1:44:13 PM By: Levora Dredge Signed: 03/09/2022 4:11:43 PM By: Worthy Keeler PA-C Entered By: Worthy Keeler on 03/09/2022 12:43:51 [name] ([id number]) -------------------------------------------------------------------------------- Vitals Details [name] Date of Service: 03/09/2022 12:15 PM Medical Record Number: [id number] Patient Account Number: [id number] Date of Birth/Sex: 1934-08-22 (88 y.o. F) Treating RN: Levora Dredge Primary Care Devanta Daniel: Fulton Reek Other Clinician: Referring Culver Feighner: Fulton Reek Treating Dago Jungwirth/Extender: Skipper Cliche in Treatment: 6 Vital Signs Time Taken: 12:34 Temperature (F): 98.0 Height (in): 64 Pulse (bpm): 67 Weight (lbs): 160 Respiratory Rate (breaths/min): 16 Body Mass Index (BMI): 27.5 Blood Pressure (mmHg): 157/70 Reference Range: 80 - 120 mg / dl Electronic Signature(s) Signed: 03/09/2022 4:11:43 PM By: Worthy Keeler PA-C Entered By: Worthy Keeler on 03/09/2022 12:35:20

## 2022-03-09 NOTE — Progress Notes (Addendum)
[name], [name] ([id number]) Visit Report for 03/09/2022 Chief Complaint Document Details [name] Date of Service: 03/09/2022 12:15 PM Medical Record Number: [id number] Patient Account Number: [id number] Date of Birth/Sex: [birth year] (88 y.o. F) Treating RN: [name] Primary Care Provider: Fulton Weiss Other Clinician: Referring Provider: Fulton Weiss Treating Provider/Extender: [name] in Treatment: 6 Information Obtained from: Patient Chief Complaint 07/14/2020; patient sent over here for a hemorrhagic blister on the right dorsal foot by her primary doctor Dr. Doy Weiss at [facility]. 01/26/2022; patient referred from nephrology at dialysis for review of a wound on the left dorsal foot distally Electronic Signature(s) Signed: 03/09/2022 12:28:53 PM By: Sarah Keeler PA-C Entered By: [name] on 03/09/2022 12:28:53 [name] ([id number]) -------------------------------------------------------------------------------- HPI Details [name] Date of Service: 03/09/2022 12:15 PM Medical Record Number: [id number] Patient Account Number: [id number] Date of Birth/Sex: March 31, 1934 (87 y.o. F) Treating RN: [name] Primary Care Provider: Fulton Weiss Other Clinician: Referring Provider: Fulton Weiss Treating Provider/Extender: [name] in Treatment: 6 History of Present Illness HPI Description: ADMISSION 07/14/2020. This is a medically complex 88 year old woman who is sent over urgently from her primary care doctor Dr. Doy Weiss at [facility] and we worked her in this afternoon. She is apparently developed a blister on the dorsal foot sometime over the last 2 weeks on the right which seems to have developed a hemorrhagic component to this. The history here is somewhat vague but this is happened since she left the hospital on 07/02/2020. She also has a blistered area on the left dorsal  foot although this does not have a hemorrhagic component to it and it is not threatened to open where is the right side is definitely leaking fluid and could easily break down. I do not believe she has been doing anything specific to this. With regards to her medical history I have reviewed the discharge summary from the hospital from 06/22/2020 through 07/02/2020. 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 She was given prednisone but was not felt to be a candidate for immunosuppression secondary to her age and comorbidities. She is ANA positive p-ANCA positive. She had a right IJ catheter placed on 9/24 and is being done on dialysis. She is a diabetic. She takes prednisone 20 mg twice daily. She was on aspirin and Plavix although I think these have been put on hold. Past medical history includes; pauci-immune crescentic glomerulonephritis. History of peripheral edema and type 2 diabetes left carotid artery stenosis with a history of a CVA, coronary artery disease and hypertension. She states that she tolerates dialysis reasonably poorly secondary to "butt cramps". She says that when she gets discomfort they stop her dialysis. This probably has something to do with her fluid overloaded state I cannot see any arterial evaluation in her record in [place] link. We could not do arterial ABIs on her because of complaints of discomfort 10/20; 1 week follow-up. This is a patient who is a diabetic. She recently developed acute renal failure because of pauci-immune glomerulonephritis. Sometime during her initial hospitalization she developed a wound on her right foot tremendous swelling in both legs. She was sent over here urgently last week by her primary care physician with what looks to be hematoma still contained on the right dorsal foot. We put silver alginate on this and put her in compression to control some of  the swelling. The left  leg had a similar appearance with severe edema in the left dorsal foot so we put that under compression as well. When she arrived back for nurse follow-up on Friday there was some drainage coming out of the wound which our nurse cultured. This is come back showing Pseudomonas Enterococcus faecalis and staph aureus although the identification of the staph aureus is not complete. I empirically put her on cefdinir 300 mg after dialysis on Tuesday Thursday and Saturday she is only had one dose. She has not been systemically unwell 10/27 1 week follow-up. With considerable help of her nursing staff I was finally able to speak to her nephrologist who manages her at dialysis. Started her on vancomycin and ceftazidime. This to cover the combination of methicillin sensitive staph aureus Enterococcus faecalis and Pseudomonas. Although the staph aureus was methicillin sensitive and the Enterococcus ampicillin sensitive vancomycin provided the best alternative here that can be given at dialysis and ceftazidime to cover the Pseudomonas. She had a traumatic wound on the top of her foot and a complicated abscess. She has significant tissue destruction from this although the wound is looking a lot better today. We are using silver alginate to the surface. X-ray of the foot did not show any deep obvious infection or osteomyelitis 12/1; since the patient was last here she was hospitalized from 11/3 through 11/10 with coag negative staphylococcal bacteremia. I do not know the the exact cause of this was determined. She was seen by vascular and they replaced her permacath on the right side of her neck. Her repeat blood cultures were negative. She is still on IV vancomycin ando Ceftazidime at dialysis. She generally feels systemically well she does not have a fever she is still receiving dialysis but reports she voids up to 8 times a day She has home health changing the dressing I believe they are  using silver alginate under 3 layer compression. In addition to the large wound on her right anterior foot that we initially saw her for she has apparently a right lateral leg wound that she suffered during a fall and 2 skin tears on the left anterior lower leg 12/8; patient comes in looking a lot better this week. One of the 2 skin tears she has on the left leg is healed the other is smaller. The area on the right lateral leg looks improved and her original wound on the right dorsal foot is a lot smaller and looks a lot better. We used Hydrofera Blue to all wound areas under 3 layer compression.. Apparently home health had some trouble with the compression wraps. 12/15 patient comes in with all of her wounds smaller including her original deep punched out wound on the right dorsal foot as well as the skin tears on the right posterior lateral calf and the left anterior lower leg. We have been using Hydrofera Blue under compression excellent results 10/12/2020 upon evaluation today patient appears to be doing decently well currently in regard to her foot ulcer. She does have a small hole still open with some tunneling that really goes proximally in the foot at around the 12:00 location. There is really not significant undermining other locations. This appears to be a small area of where there was a little fluid trapped. Nonetheless I do feel like this is still doing quite well and in general I think that she is going to benefit from possibly packing into this area with endoform in order to keep the area open while it granulates in.  11/09/2020 upon evaluation today patient appears to be doing really about the same in regard to her wound. There is a little reopening towards the distal portion of the wound bed but again I think this is just more as a result of the dressing possibly getting stuck even to little bit of the scar tissue nonetheless it also anything looks worse although I am not sure the endoform  is really doing the job for her. I think we may want to try something a little different here I think packing with a silver alginate dressing may be optimal as compared to what we tried at this point. Is been almost a month since have seen her and we really have not seen dramatic improvement. 12/07/2020 upon evaluation today patient appears to be doing well with regard to the dorsal surface of her foot. Overall I feel like this looks to be completely healed today and this is excellent news. There is no evidence of active infection which is great news. No fevers, chills, nausea, vomiting, or diarrhea. OREE, MIRELEZ ([id number]) READMISSION 01/26/2022 This is an 88 year old woman that we had in clinic October 21 through March 22 with bilateral lower extremity wounds on the dorsal feet at that time she had pauci-immune glomerulonephritis but was not yet on dialysis. We managed to heal her out. As I remember she had severe bilateral lower extremity edema secondary to acute on chronic renal failure. She arrives in clinic this time with a wound on her distal left dorsal foot that is been there for about 6 months. Completely eschar covered she has been treating this with Neosporin. She said she is on IV vancomycin and cefepime at dialysis although we have no independent verification of this. This is due to be finished on 01/30/2022. She says it was for this foot but she does not say that she has had x-rays and MRI etc. Past medical history is extensive; she has type 2 diabetes although not currently in any treatment, end-stage renal disease on dialysis Monday Wednesday and Friday, coronary artery disease status post CABG, combined systolic and diastolic heart failure, peripheral vascular disease, cerebrovascular disease. Carotid artery stenosis, COPD, breast cancer ABI in our clinic was 0.79. I cannot find that she has had previous noninvasive arterial studies. She is only able to walk around the house  does not describe claudication that I can elicit Upon inspection patient's wounds actually appear to be doing decently well. On the left foot this is mainly dry drainage, bleeding, and Iodoflex which is stuck on the surface of the wound I was able to get that off mechanically she did not want any sharp debridement whatsoever fortunately is able to get it off mechanically without using that just using saline and gauze. Subsequently I think this is otherwise doing quite well. On the right leg she does have swelling and she had a blister that popped up this feels like it almost completely sealed down but it still weeping and leaking a little bit I think we need to manage that as well to be honest. 02-14-2022 upon evaluation today patient appears to be doing well currently in regard to the wound on her foot. She has been tolerating the dressing changes there is a little bit of film buildup here in the patient last week was very resistant to debridement because her ABIs were somewhat low I was avoiding that as well. However she did see Dr. Lazaro Arms and he apparently told her that her ABIs were good everything  appeared to be fine and to be honest I think this is excellent news. I am very pleased to hear this and I do believe doing some sharp debridement would be helpful at this point as well. 02-21-2022 upon evaluation today patient appears to be doing well with regard to her wounds. Both are showing signs of improvement this is great news. Fortunately I do not see any evidence of active infection locally or systemically at this time which is great news. No fevers, chills, nausea, vomiting, or diarrhea. 03-02-2022 upon evaluation today patient appears to be doing better in regard to her right leg which appears to be healed. In regards to her left leg this on the foot at the top still has a small opening at this time. Fortunately there does not appear to be any signs of infection. With that being said I do think we  may want to switch over to collagen based dressing to see if we get this to feeling a little bit better. 03-09-2022 upon evaluation today patient appears to be doing well currently in regard to her wound on the top of her left foot. I am very pleased with where things stand and I think we are headed in the right direction here. She is still having a lot of trouble with the compression wrap. She is doing extremely well on the right side with the Tubigrip so I think we can probably switch over to using the Tubigrip on the left side as well especially since the collagen is getting so dry I do not believe this is doing her as much good as I would like for her to to be honest. Electronic Signature(s) Signed: 03/09/2022 1:55:25 PM By: Sarah Keeler PA-C Entered By: [name] on 03/09/2022 13:55:24 [name] ([id number]) -------------------------------------------------------------------------------- Physical Exam Details [name] Date of Service: 03/09/2022 12:15 PM Medical Record Number: [id number] Patient Account Number: [id number] Date of Birth/Sex: 1934/10/01 (87 y.o. F) Treating RN: [name] Primary Care Provider: Fulton Weiss Other Clinician: Referring Provider: Fulton Weiss Treating Provider/Extender: [name] in Treatment: 6 Constitutional Well-nourished and well-hydrated in no acute distress. Respiratory normal breathing without difficulty. Psychiatric this patient is able to make decisions and demonstrates good insight into disease process. Alert and Oriented x 3. pleasant and cooperative. Notes Patient's wound did not require any sharp debridement although the dressing was very dry and stuck to the wound bed. This is good and that the wound is getting smaller bad and that when it gets dry like this the collagen really does nothing. Electronic Signature(s) Signed: 03/09/2022 1:55:39 PM By: Sarah Keeler PA-C Entered By: [name] on 03/09/2022 13:55:39 [name] ([id number]) -------------------------------------------------------------------------------- Physician Orders Details [name] Date of Service: 03/09/2022 12:15 PM Medical Record Number: [id number] Patient Account Number: [id number] Date of Birth/Sex: 1934/10/01 (87 y.o. F) Treating RN: [name] Primary Care Provider: Fulton Weiss Other Clinician: Referring Provider: Fulton Weiss Treating Provider/Extender: [name] in Treatment: 6 Verbal / Phone Orders: No Diagnosis Coding ICD-10 Coding Code Description E11.621 Type 2 diabetes mellitus with foot ulcer E11.51 Type 2 diabetes mellitus with diabetic peripheral angiopathy without gangrene I12.0 Hypertensive chronic kidney disease with stage 5 chronic kidney disease or end stage renal disease L97.528 Non-pressure chronic ulcer of other part of left foot with other specified severity Follow-up Appointments o Return Appointment in 1 week. Bathing/ Shower/ Hygiene o May shower with wound dressing protected with water repellent  cover or cast protector. Edema Control - Lymphedema / Segmental Compressive Device / Other o Elevate, Exercise Daily and Avoid Standing for Long Periods of Time. o Elevate legs to the level of the heart and pump ankles as often as possible o Elevate leg(s) parallel to the floor when sitting. Non-Wound Condition o Additional non-wound orders/instructions: - tubi d, continue home care of right lower extremity Wound Treatment Wound #5 - Foot Wound Laterality: Dorsal, Left Cleanser: Soap and Water 3 x Per Week/30 Days Discharge Instructions: Gently cleanse wound with antibacterial soap, rinse and pat dry prior to dressing wounds Primary Dressing: Xeroform 5x9-HBD (in/in) 3 x Per Week/30 Days Discharge Instructions: Apply Xeroform 5x9-HBD (in/in) as directed Secondary Dressing: Coverlet Latex-Free Fabric Adhesive  Dressings 3 x Per Week/30 Days Discharge Instructions: 1.5 x 2 Secured With: Tubigrip Size D, 3x10 (in/yd) 3 x Per Week/30 Days Electronic Signature(s) Signed: 03/09/2022 4:11:43 PM By: Sarah Keeler PA-C Entered By: [name] on 03/09/2022 12:55:42 [name] ([id number]) -------------------------------------------------------------------------------- Problem List Details [name] Date of Service: 03/09/2022 12:15 PM Medical Record Number: [id number] Patient Account Number: [id number] Date of Birth/Sex: 06/01/1934 (87 y.o. F) Treating RN: [name] Primary Care Provider: Fulton Weiss Other Clinician: Referring Provider: Fulton Weiss Treating Provider/Extender: [name] in Treatment: 6 Active Problems ICD-10 Encounter Code Description Active Date MDM Diagnosis E11.621 Type 2 diabetes mellitus with foot ulcer 01/26/2022 No Yes E11.51 Type 2 diabetes mellitus with diabetic peripheral angiopathy without 01/26/2022 No Yes gangrene I12.0 Hypertensive chronic kidney disease with stage 5 chronic kidney disease 01/26/2022 No Yes or end stage renal disease L97.528 Non-pressure chronic ulcer of other part of left foot with other specified 01/26/2022 No Yes severity Inactive Problems Resolved Problems Electronic Signature(s) Signed: 03/09/2022 12:28:50 PM By: Sarah Keeler PA-C Entered By: [name] on 03/09/2022 12:28:49 [name] ([id number]) -------------------------------------------------------------------------------- Progress Note Details [name] Date of Service: 03/09/2022 12:15 PM Medical Record Number: [id number] Patient Account Number: [id number] Date of Birth/Sex: 04-26-1934 (87 y.o. F) Treating RN: [name] Primary Care Provider: Fulton Weiss Other Clinician: Referring Provider: Fulton Weiss Treating Provider/Extender: [name] in Treatment:  6 Subjective Chief Complaint Information obtained from Patient 07/14/2020; patient sent over here for a hemorrhagic blister on the right dorsal foot by her primary doctor Dr. Doy Weiss at [facility]. 01/26/2022; patient referred from nephrology at dialysis for review of a wound on the left dorsal foot distally History of Present Illness (HPI) ADMISSION 07/14/2020. This is a medically complex 88 year old woman who is sent over urgently from her primary care doctor Dr. Doy Weiss at [facility] and we worked her in this afternoon. She is apparently developed a blister on the dorsal foot sometime over the last 2 weeks on the right which seems to have developed a hemorrhagic component to this. The history here is somewhat vague but this is happened since she left the hospital on 07/02/2020. She also has a blistered area on the left dorsal foot although this does not have a hemorrhagic component to it and it is not threatened to open where is the right side is definitely leaking fluid and could easily break down. I do not believe she has been doing anything specific to this. With regards to her medical history I have reviewed the discharge summary from the hospital from 06/22/2020 through 07/02/2020. 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 She was  given prednisone but was not felt to be a candidate for immunosuppression secondary to her age and comorbidities. She is ANA positive p-ANCA positive. She had a right IJ catheter placed on 9/24 and is being done on dialysis. She is a diabetic. She takes prednisone 20 mg twice daily. She was on aspirin and Plavix although I think these have been put on hold. Past medical history includes; pauci-immune crescentic glomerulonephritis. History of peripheral edema and type 2 diabetes left carotid artery stenosis with a history of a CVA, coronary artery disease and  hypertension. She states that she tolerates dialysis reasonably poorly secondary to "butt cramps". She says that when she gets discomfort they stop her dialysis. This probably has something to do with her fluid overloaded state I cannot see any arterial evaluation in her record in [facility] link. We could not do arterial ABIs on her because of complaints of discomfort 10/20; 1 week follow-up. This is a patient who is a diabetic. She recently developed acute renal failure because of pauci-immune glomerulonephritis. Sometime during her initial hospitalization she developed a wound on her right foot tremendous swelling in both legs. She was sent over here urgently last week by her primary care physician with what looks to be hematoma still contained on the right dorsal foot. We put silver alginate on this and put her in compression to control some of the swelling. The left leg had a similar appearance with severe edema in the left dorsal foot so we put that under compression as well. When she arrived back for nurse follow-up on Friday there was some drainage coming out of the wound which our nurse cultured. This is come back showing Pseudomonas Enterococcus faecalis and staph aureus although the identification of the staph aureus is not complete. I empirically put her on cefdinir 300 mg after dialysis on Tuesday Thursday and Saturday she is only had one dose. She has not been systemically unwell 10/27 1 week follow-up. With considerable help of her nursing staff I was finally able to speak to her nephrologist who manages her at dialysis. Started her on vancomycin and ceftazidime. This to cover the combination of methicillin sensitive staph aureus Enterococcus faecalis and Pseudomonas. Although the staph aureus was methicillin sensitive and the Enterococcus ampicillin sensitive vancomycin provided the best alternative here that can be given at dialysis and ceftazidime to cover the Pseudomonas. She had a  traumatic wound on the top of her foot and a complicated abscess. She has significant tissue destruction from this although the wound is looking a lot better today. We are using silver alginate to the surface. X-ray of the foot did not show any deep obvious infection or osteomyelitis 12/1; since the patient was last here she was hospitalized from 11/3 through 11/10 with coag negative staphylococcal bacteremia. I do not know the the exact cause of this was determined. She was seen by vascular and they replaced her permacath on the right side of her neck. Her repeat blood cultures were negative. She is still on IV vancomycin ando Ceftazidime at dialysis. She generally feels systemically well she does not have a fever she is still receiving dialysis but reports she voids up to 8 times a day She has home health changing the dressing I believe they are using silver alginate under 3 layer compression. In addition to the large wound on her right anterior foot that we initially saw her for she has apparently a right lateral leg wound that she suffered during a fall and  2 skin tears on the left anterior lower leg 12/8; patient comes in looking a lot better this week. One of the 2 skin tears she has on the left leg is healed the other is smaller. The area on the right lateral leg looks improved and her original wound on the right dorsal foot is a lot smaller and looks a lot better. We used Hydrofera Blue to all wound areas under 3 layer compression.. Apparently home health had some trouble with the compression wraps. 12/15 patient comes in with all of her wounds smaller including her original deep punched out wound on the right dorsal foot as well as the skin tears on the right posterior lateral calf and the left anterior lower leg. We have been using Hydrofera Blue under compression excellent results 10/12/2020 upon evaluation today patient appears to be doing decently well currently in regard to her foot  ulcer. She does have a small hole still open with some tunneling that really goes proximally in the foot at around the 12:00 location. There is really not significant undermining other locations. This appears to be a small area of where there was a little fluid trapped. Nonetheless I do feel like this is still doing quite well and in general I think that she is going to benefit from possibly packing into this area with endoform in order to keep the area open while it granulates in. 11/09/2020 upon evaluation today patient appears to be doing really about the same in regard to her wound. There is a little reopening towards the distal portion of the wound bed but again I think this is just more as a result of the dressing possibly getting stuck even to little bit of the scar tissue nonetheless it also anything looks worse although I am not sure the endoform is really doing the job for her. I think we may want to try [name], Sarah T. ([id number]) something a little different here I think packing with a silver alginate dressing may be optimal as compared to what we tried at this point. Is been almost a month since have seen her and we really have not seen dramatic improvement. 12/07/2020 upon evaluation today patient appears to be doing well with regard to the dorsal surface of her foot. Overall I feel like this looks to be completely healed today and this is excellent news. There is no evidence of active infection which is great news. No fevers, chills, nausea, vomiting, or diarrhea. 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 We managed to heal her out. As I remember she had severe bilateral lower extremity edema secondary to acute on chronic renal failure. She arrives in clinic this time with a wound on her  distal left dorsal foot that is been there for about 6 months. Completely eschar covered she has been treating this with Neosporin. She said she is on IV vancomycin and cefepime at dialysis although we have no independent verification of this. This is due to be finished on 01/30/2022. She says it was for this foot but she does not say that she has had x-rays and MRI etc. Past medical history is extensive; she has type 2 diabetes although not currently in any treatment, end-stage renal disease on dialysis Monday Wednesday and Friday, coronary artery disease status post CABG, combined systolic  and diastolic heart failure, peripheral vascular disease, cerebrovascular disease. Carotid artery stenosis, COPD, breast cancer ABI in our clinic was 0.79. I cannot find that she has had previous noninvasive arterial studies. She is only able to walk around the house does not describe claudication that I can elicit Upon inspection patient's wounds actually appear to be doing decently well. On the left foot this is mainly dry drainage, bleeding, and Iodoflex which is stuck on the surface of the wound I was able to get that off mechanically she did not want any sharp debridement whatsoever fortunately is able to get it off mechanically without using that just using saline and gauze. Subsequently I think this is otherwise doing quite well. On the right leg she does have swelling and she had a blister that popped up this feels like it almost completely sealed down but it still weeping and leaking a little bit I think we need to manage that as well to be honest. 02-14-2022 upon evaluation today patient appears to be doing well currently in regard to the wound on her foot. She has been tolerating the dressing changes there is a little bit of film buildup here in the patient last week was very resistant to debridement because her ABIs were somewhat low I was avoiding that as well. However she did see Dr. Lazaro Arms and he  apparently told her that her ABIs were good everything appeared to be fine and to be honest I think this is excellent news. I am very pleased to hear this and I do believe doing some sharp debridement would be helpful at this point as well. 02-21-2022 upon evaluation today patient appears to be doing well with regard to her wounds. Both are showing signs of improvement this is great news. Fortunately I do not see any evidence of active infection locally or systemically at this time which is great news. No fevers, chills, nausea, vomiting, or diarrhea. 03-02-2022 upon evaluation today patient appears to be doing better in regard to her right leg which appears to be healed. In regards to her left leg this on the foot at the top still has a small opening at this time. Fortunately there does not appear to be any signs of infection. With that being said I do think we may want to switch over to collagen based dressing to see if we get this to feeling a little bit better. 03-09-2022 upon evaluation today patient appears to be doing well currently in regard to her wound on the top of her left foot. I am very pleased with where things stand and I think we are headed in the right direction here. She is still having a lot of trouble with the compression wrap. She is doing extremely well on the right side with the Tubigrip so I think we can probably switch over to using the Tubigrip on the left side as well especially since the collagen is getting so dry I do not believe this is doing her as much good as I would like for her to to be honest. Objective Constitutional Well-nourished and well-hydrated in no acute distress. Vitals Time Taken: 12:34 PM, Height: 64 in, Weight: 160 lbs, BMI: 27.5, Temperature: 98.0 F, Pulse: 67 bpm, Respiratory Rate: 16 breaths/min, Blood Pressure: 157/70 mmHg. Respiratory normal breathing without difficulty. Psychiatric this patient is able to make decisions and demonstrates good  insight into disease process. Alert and Oriented x 3. pleasant and cooperative. General Notes: Patient's wound did not require any sharp  debridement although the dressing was very dry and stuck to the wound bed. This is good and that the wound is getting smaller bad and that when it gets dry like this the collagen really does nothing. Integumentary (Hair, Skin) [name], Sarah T. ([id number]) Wound #5 status is Open. Original cause of wound was Gradually Appeared. The date acquired was: 08/02/2021. The wound has been in treatment 6 weeks. The wound is located on the Left,Dorsal Foot. The wound measures 0.4cm length x 0.4cm width x 0.2cm depth; 0.126cm^2 area and 0.025cm^3 volume. There is Fat Layer (Subcutaneous Tissue) exposed. There is no tunneling or undermining noted. There is a medium amount of serosanguineous drainage noted. There is medium (34-66%) pink granulation within the wound bed. There is a medium (34-66%) amount of necrotic tissue within the wound bed including Adherent Slough. Assessment Active Problems ICD-10 Type 2 diabetes mellitus with foot ulcer Type 2 diabetes mellitus with diabetic peripheral angiopathy without gangrene Hypertensive chronic kidney disease with stage 5 chronic kidney disease or end stage renal disease Non-pressure chronic ulcer of other part of left foot with other specified severity Plan Follow-up Appointments: Return Appointment in 1 week. Bathing/ Shower/ Hygiene: May shower with wound dressing protected with water repellent cover or cast protector. Edema Control - Lymphedema / Segmental Compressive Device / Other: Elevate, Exercise Daily and Avoid Standing for Long Periods of Time. Elevate legs to the level of the heart and pump ankles as often as possible Elevate leg(s) parallel to the floor when sitting. Non-Wound Condition: Additional non-wound orders/instructions: - tubi d, continue home care of right lower extremity WOUND #5: - Foot Wound  Laterality: Dorsal, Left Cleanser: Soap and Water 3 x Per Week/30 Days Discharge Instructions: Gently cleanse wound with antibacterial soap, rinse and pat dry prior to dressing wounds Primary Dressing: Xeroform 5x9-HBD (in/in) 3 x Per Week/30 Days Discharge Instructions: Apply Xeroform 5x9-HBD (in/in) as directed Secondary Dressing: Coverlet Latex-Free Fabric Adhesive Dressings 3 x Per Week/30 Days Discharge Instructions: 1.5 x 2 Secured With: Tubigrip Size D, 3x10 (in/yd) 3 x Per Week/30 Days 1. I would recommend switching over to Xeroform for the left dorsal foot this is so dry I think Xeroform to be a much better way to go. 2. We will switch over to utilizing Tubigrip for both sides instead of the compression wrap on the left. I think this is going to do much better. We will keep things cleaner and her husband can change it more frequently. We will see patient back for reevaluation in 1 week here in the clinic. If anything worsens or changes patient will contact our office for additional recommendations. Electronic Signature(s) Signed: 03/09/2022 1:56:24 PM By: Sarah Keeler PA-C Entered By: [name] on 03/09/2022 13:56:24 [name] ([id number]) -------------------------------------------------------------------------------- SuperBill Details [name] Date of Service: 03/09/2022 Medical Record Number: [id number] Patient Account Number: [id number] Date of Birth/Sex: Feb 11, 1934 (87 y.o. F) Treating RN: [name] Primary Care Provider: Fulton Weiss Other Clinician: Referring Provider: Fulton Weiss Treating Provider/Extender: [name] in Treatment: 6 Diagnosis Coding ICD-10 Codes Code Description E11.621 Type 2 diabetes mellitus with foot ulcer E11.51 Type 2 diabetes mellitus with diabetic peripheral angiopathy without gangrene I12.0 Hypertensive chronic kidney disease with stage 5 chronic kidney disease or end stage renal  disease L97.528 Non-pressure chronic ulcer of other part of left foot with other specified severity Facility Procedures CPT4 Code: 62703500 Description: 99213 - WOUND CARE VISIT-LEV 3 EST PT Modifier: Quantity: 1 Physician Procedures CPT4  Code Description: 9692493 99213 - WC PHYS LEVEL 3 - EST PT Modifier: Quantity: 1 CPT4 Code Description: ICD-10 Diagnosis Description E11.621 Type 2 diabetes mellitus with foot ulcer E11.51 Type 2 diabetes mellitus with diabetic peripheral angiopathy without ga I12.0 Hypertensive chronic kidney disease with stage 5 chronic kidney  disease L97.528 Non-pressure chronic ulcer of other part of left foot with other specif Modifier: ngrene or end stage renal d ied severity Quantity: isease Electronic Signature(s) Signed: 03/09/2022 1:56:45 PM By: Sarah Keeler PA-C Entered By: [name] on 03/09/2022 13:56:45

## 2022-03-20 NOTE — Progress Notes (Signed)
Cardiology Office Note  Date:  03/21/2022   ID:  [name], [name] [birth year], MRN [id number]  PCP:  Idelle Crouch, MD   Chief Complaint  Patient presents with   6 month follow up     "Doing well." Medications reviewed by the patient verbally.     HPI:  [name] is an 88yo woman with  coronary artery disease,  cardiac catheterization at [facility] 08/22/2012 showing severe distal left main, ostial LAD and circumflex disease also with RCA disease  CABG x4, LIMA to LAD, SVG to Diag, SVG to OM1, SVG to PDA, EVH from bilateral thighs postsurgical stroke with right-sided deficits Chronic renal insufficiency, hydronephrosis ESRD on HD 3x a week Anemia Chronic foot swelling, particularly the feet who presents for followup today of her coronary artery disease, renal failure  Last seen on clinic November 2022  Lab work reviewed Total cholesterol 90 LDL 33 Hemoglobin 11.5 Creatinine 4.2 BUN 36 A1c 4.9  Problems with buttock cramping Comes at HD Sometimes has to stop HD Sometimes has trouble walking after dialysis, husband has to help her get into the house, legs feel weak  Has compressions on lower extremities, pinching below the knees Sore on foot, " healing" No regular activity, legs weak, risk of falls Presents today in a wheelchair  EKG personally reviewed by myself on todays visit Normal sinus rhythm rate 69 bpm poor R wave progression to the anterior precordial leads, left axis deviation  Past medical history reviewed Cardiomyopathy ejection fraction 35 to 40% noted last echocardiogram August 2022 in the setting of sepsis  possibly covid Metoprolol tartrate was changed to metoprolol succinate  Medication options limited secondary to renal dysfunction/end-stage renal disease and some medication reluctance  Could not tolerate metoprolol succinate, was having side effects, she stopped the medication went back on metoprolol tartrate 12.5 twice daily  HD on  Mon/wed/Fri  Chronic mild lower extremity edema  Labs reviewed: HGB 10.6 TSH 6.48  In hospital 05/2021 Acute pulmonary edema, requiring HD Sphingobacterium bacteremia HD access declotting D/c 05/25/2021  Recent hospital admission June 09, 2021 Weakness, COVID infection, end-stage renal disease dialysis Treated with remdesivir Initial diagnosis September 6 Had acute metabolic encephalopathy Receiving dialysis Monday Wednesday Friday  Ejection fraction on echocardiogram May 24, 2021 was 35 to 40% Ejection fraction down from prior echo November 2021, EF 60 to 65%  In the hospital for procedure with Dr. Lucky Cowboy April 2022, Creation of AV fistula  On HD,  mon/wed/fri  In hospital 08/11/2020 infected wound to dorsal aspect of right foot  outpatient dialysis since 07/28/20 wound cultures 07/16/2020 show Pseudomonas, Enterococcus and MSSA.  staph warneri. treated with IV vancomycin and IV Zosyn.    permacath on the right side of her neck that was removed by the vascular surgeon on 08/05/2020.   new tunneled dialysis catheter was inserted via right IJ approach on 08/10/2020.    4-week course of IV vancomycin and 3-week course of IV ceftazidime to be given during dialysis in the outpatient setting. amlodipine and hydralazine were discontinued secondary to hypotension  Reports that she is concerned about a sacral decub ulcer, As she is sitting for such long periods of time for dialysis She is sedentary, relatively immobile   previously seen Dr. Lucky Cowboy, for venous insufficiency   cardiac catheterization report 08/22/2012 showed distal left main 70% disease, ostial LAD of 80%, proximal LAD 70%, distal LAD diffuse 70%, diagonal #150% followed by discrete 95% lesion, ostial circumflex 90% disease, proximal circumflex 70%  disease, proximal RCA 50%, distal RCA 60% PDA branch 60%  PMH:   has a past medical history of Arthritis, Bilateral Macular degeneration, Breast cancer ([place]) (2012),  Breast mass, right, CAD S/P CABG x 4, Cancer ([place]), Complication of anesthesia, Coronary artery disease (08/22/2012), Diabetes ([place]), Diastolic dysfunction, DOE (dyspnea on exertion), ESRD (end stage renal disease) ([place]), Gastritis, GERD (gastroesophageal reflux disease), Headache(784.0), History of breast cancer, History of seasonal allergies, Hyperlipemia, Hypertension, Hypothyroidism, Neuropathy, Personal history of radiation therapy, Pneumonia, PONV (postoperative nausea and vomiting), Stroke ([place]), and Vertigo.  PSH:    Past Surgical History:  Procedure Laterality Date   A/V FISTULAGRAM Left 01/13/2021   Procedure: A/V FISTULAGRAM;  Surgeon: Algernon Huxley, MD;  Location: [place] [facility];  Service: Cardiovascular;  Laterality: Left;   A/V SHUNT INTERVENTION Left 05/23/2021   Procedure: A/V SHUNT INTERVENTION;  Surgeon: Algernon Huxley, MD;  Location: [place] [facility];  Service: Cardiovascular;  Laterality: Left;   ABDOMINAL HYSTERECTOMY     APPENDECTOMY     AV FISTULA PLACEMENT Left 11/04/2020   Procedure: ARTERIOVENOUS (AV) FISTULA CREATION (BRACHIOCEPHALIC);  Surgeon: Algernon Huxley, MD;  Location: [facility];  Service: Vascular;  Laterality: Left;   BACK SURGERY     BREAST BIOPSY Right    2012 positive- [facility]   BREAST LUMPECTOMY Right 2012   F/U radiation    BREAST SURGERY     CARDIAC CATHETERIZATION  2014   CAROTID PTA/STENT INTERVENTION Left 06/14/2020   Procedure: CAROTID PTA/STENT INTERVENTION;  Surgeon: Algernon Huxley, MD;  Location: [place] [facility];  Service: Cardiovascular;  Laterality: Left;   CATARACT EXTRACTION W/PHACO Right 08/14/2017   Procedure: CATARACT EXTRACTION PHACO AND INTRAOCULAR LENS PLACEMENT (IOC);  Surgeon: Birder Robson, MD;  Location: [facility];  Service: Ophthalmology;  Laterality: Right;  [place] 00:43.0 AP% 17.1 CDE 7.37 Fluid Pack lot # [id number]   CATARACT EXTRACTION W/PHACO Left 10/09/2017   Procedure: CATARACT EXTRACTION PHACO AND INTRAOCULAR LENS PLACEMENT  (IOC);  Surgeon: Birder Robson, MD;  Location: [facility];  Service: Ophthalmology;  Laterality: Left;  [place] 00:30.3 AP% 11.0 CDE 3.33 Fluid Pack Lot # [id number]   CORONARY ARTERY BYPASS GRAFT  09/05/2012   Procedure: CORONARY ARTERY BYPASS GRAFTING (CABG);  Surgeon: Rexene Alberts, MD;  Location: [place];  Service: Open Heart Surgery;  Laterality: N/A;  CABG x four, using left internal mammary artery and bilateral greater saphenous vein harvested endoscopically   DIALYSIS/PERMA CATHETER INSERTION Left 06/25/2020   Procedure: DIALYSIS/PERMA CATHETER INSERTION;  Surgeon: Algernon Huxley, MD;  Location: [place] [facility];  Service: Cardiovascular;  Laterality: Left;   DIALYSIS/PERMA CATHETER INSERTION N/A 08/10/2020   Procedure: DIALYSIS/PERMA CATHETER INSERTION;  Surgeon: Katha Cabal, MD;  Location: [place] [facility];  Service: Cardiovascular;  Laterality: N/A;   DIALYSIS/PERMA CATHETER INSERTION N/A 05/23/2021   Procedure: DIALYSIS/PERMA CATHETER INSERTION;  Surgeon: Algernon Huxley, MD;  Location: [place] [facility];  Service: Cardiovascular;  Laterality: N/A;   DIALYSIS/PERMA CATHETER REMOVAL N/A 08/05/2020   Procedure: DIALYSIS/PERMA CATHETER REMOVAL;  Surgeon: Algernon Huxley, MD;  Location: [place] [facility];  Service: Cardiovascular;  Laterality: N/A;   INTRAOPERATIVE TRANSESOPHAGEAL ECHOCARDIOGRAM  09/05/2012   Procedure: INTRAOPERATIVE TRANSESOPHAGEAL ECHOCARDIOGRAM;  Surgeon: Rexene Alberts, MD;  Location: [place];  Service: Open Heart Surgery;  Laterality: N/A;   KNEE SURGERY     Bilateral nerve block   LAPAROSCOPIC NISSEN FUNDOPLICATION     OVARIAN CYST REMOVAL  Current Outpatient Medications  Medication Sig Dispense Refill   acetaminophen (TYLENOL) 325 MG tablet Take 2 tablets (650 mg total) by mouth every 6 (six) hours as needed for headache.     ALPRAZolam (XANAX) 0.5 MG tablet Take 1 tablet (0.5 mg total) by mouth 2 (two) times daily as needed for anxiety. 10 tablet 0    calcium-vitamin D (OSCAL WITH D) 500-200 MG-UNIT tablet Take 1 tablet by mouth daily.     clopidogrel (PLAVIX) 75 MG tablet Take 75 mg by mouth daily.     Cyanocobalamin 1000 MCG SUBL Take 1,000 mcg by mouth daily.      famotidine (PEPCID) 10 MG tablet Take 1 tablet (10 mg total) by mouth daily.     gabapentin (NEURONTIN) 100 MG capsule Take 1 capsule by mouth 2 (two) times daily.     HYDROcodone-acetaminophen (NORCO/VICODIN) 5-325 MG tablet Take 1 tablet by mouth every 6 (six) hours as needed for moderate pain. 10 tablet 0   levothyroxine (SYNTHROID) 150 MCG tablet Take by mouth.     metoprolol tartrate (LOPRESSOR) 25 MG tablet Take 0.5 tablets (12.5 mg total) by mouth 2 (two) times daily. 90 tablet 3   ondansetron (ZOFRAN ODT) 4 MG disintegrating tablet Take 1 tablet (4 mg total) by mouth every 8 (eight) hours as needed for nausea or vomiting. 20 tablet 0   OXYGEN Inhale 2 L into the lungs daily.     torsemide (DEMADEX) 100 MG tablet Take 100 mg by mouth 3 (three) times a week. Take 1 tablet three times a week on non-dialysis days.     aspirin 81 MG chewable tablet Chew 81 mg by mouth every Monday, Wednesday, and Friday.  (Patient not taking: Reported on 02/14/2022)     No current facility-administered medications for this visit.    Allergies:   Clonidine, Darvocet [propoxyphene n-acetaminophen], Hydralazine, Hydralazine hcl, Metoprolol, Esomeprazole, Guaifenesin, Phenylephrine-guaifenesin, Rabeprazole, Telithromycin, Fluocinolone, Iodinated contrast media, Levaquin [levofloxacin], and Statins   Social History:  The patient  reports that she quit smoking about 33 years ago. Her smoking use included cigarettes. She has never used smokeless tobacco. She reports that she does not drink alcohol and does not use drugs.   Family History:   family history includes Breast cancer in her cousin; Cancer in her father; Heart disease in her brother and mother; Hyperlipidemia in her brother; Hypertension in  her brother.    Review of Systems: Review of Systems  Constitutional: Negative.   HENT: Negative.    Eyes: Negative.   Respiratory: Negative.    Cardiovascular:  Positive for leg swelling.  Gastrointestinal: Negative.   Musculoskeletal:  Positive for joint pain.       Gait instability  Neurological: Negative.   Psychiatric/Behavioral: Negative.    All other systems reviewed and are negative.   PHYSICAL EXAM: VS:  BP (!) 150/76 (BP Location: Left Arm, Patient Position: Sitting, Cuff Size: Normal)   Pulse 69   Ht '5\' 4"'$  (1.626 m)   Wt 169 lb (76.7 kg)   SpO2 95% Comment: 2 Liters of oxygen  BMI 29.01 kg/m  , BMI Body mass index is 29.01 kg/m. Constitutional:  oriented to person, place, and time. No distress.  HENT:  Head: Grossly normal Eyes:  no discharge. No scleral icterus.  Neck: No JVD, no carotid bruits  Cardiovascular: Regular rate and rhythm, no murmurs appreciated Pulmonary/Chest: Clear to auscultation bilaterally, no wheezes or rails Abdominal: Soft.  no distension.  no tenderness.  Musculoskeletal: Normal range  of motion Neurological:  normal muscle tone. Coordination normal. No atrophy Skin: Skin warm and dry Psychiatric: normal affect, pleasant  Recent Labs: 05/21/2021: B Natriuretic Peptide 1,955.9 06/10/2021: Magnesium 2.0 06/14/2021: ALT 5 06/17/2021: BUN 53; Creatinine, Ser 6.11; Hemoglobin 10.1; Platelets 210; Potassium 4.0; Sodium 130    Lipid Panel Lab Results  Component Value Date   CHOL 154 09/09/2012   HDL 31 (L) 09/09/2012   LDLCALC 92 09/09/2012   TRIG 157 (H) 09/09/2012     Wt Readings from Last 3 Encounters:  03/21/22 169 lb (76.7 kg)  01/03/22 160 lb (72.6 kg)  11/01/21 162 lb (73.5 kg)     ASSESSMENT AND PLAN:  Cardiomyopathy Noted on last echocardiogram in the setting of sepsis August 2022 Recommended repeat echocardiogram Did not tolerate metoprolol succinate, back on metoprolol tartrate and torsemide Numerous medication  intolerances options limited secondary to renal dysfunction/end-stage renal disease and some medication reluctance -Could consider low-dose nitrates, hydralazine  Chronic nausea Previously managed with Zofran  Essential hypertension -  Blood pressure mildly elevated today, has had short runs of dialysis given having to stop for cramping  Coronary artery disease involving coronary bypass graft of native heart with angina pectoris (HCC) - Currently with no symptoms of angina. No further workup at this time. Continue current medication regimen.  Pulmonary edema On torsemide 3 times a week On HD 3 times a week Leg swelling likely secondary to lymphedema  Mixed hyperlipidemia  declined cholesterol medication Numbers at goal without medication  S/P CABG x 4  Denies angina, very sedentary  End-stage renal disease on hemodialysis Monday Wednesday Friday, Also on torsemide  Anemia, unspecified type  Chronic anemia, managed by nephrology  Weakness Had nerve blocks to her knees bilaterally  Sedentary, weak legs, high fall risk   Total encounter time more than 30 minutes  Greater than 50% was spent in counseling and coordination of care with the patient     No orders of the defined types were placed in this encounter.     Signed, Esmond Plants, M.D., Ph.D. 03/21/2022  [place], [place]

## 2022-03-21 ENCOUNTER — Ambulatory Visit (INDEPENDENT_AMBULATORY_CARE_PROVIDER_SITE_OTHER): Payer: Medicare Other | Admitting: Cardiovascular Disease

## 2022-03-21 ENCOUNTER — Encounter: Payer: Self-pay | Admitting: Cardiovascular Disease

## 2022-03-21 VITALS — BP 150/76 | HR 69 | Ht 64.0 in | Wt 169.0 lb

## 2022-03-21 DIAGNOSIS — Z951 Presence of aortocoronary bypass graft: Secondary | ICD-10-CM

## 2022-03-21 DIAGNOSIS — R0602 Shortness of breath: Secondary | ICD-10-CM | POA: Diagnosis not present

## 2022-03-21 DIAGNOSIS — I10 Essential (primary) hypertension: Secondary | ICD-10-CM | POA: Diagnosis not present

## 2022-03-21 DIAGNOSIS — I429 Cardiomyopathy, unspecified: Secondary | ICD-10-CM

## 2022-03-21 DIAGNOSIS — E785 Hyperlipidemia, unspecified: Secondary | ICD-10-CM

## 2022-03-21 DIAGNOSIS — I25810 Atherosclerosis of coronary artery bypass graft(s) without angina pectoris: Secondary | ICD-10-CM | POA: Diagnosis not present

## 2022-03-21 DIAGNOSIS — N186 End stage renal disease: Secondary | ICD-10-CM | POA: Diagnosis not present

## 2022-03-21 DIAGNOSIS — E119 Type 2 diabetes mellitus without complications: Secondary | ICD-10-CM

## 2022-03-21 DIAGNOSIS — E1159 Type 2 diabetes mellitus with other circulatory complications: Secondary | ICD-10-CM

## 2022-03-21 DIAGNOSIS — Z992 Dependence on renal dialysis: Secondary | ICD-10-CM

## 2022-03-21 DIAGNOSIS — I639 Cerebral infarction, unspecified: Secondary | ICD-10-CM

## 2022-03-21 NOTE — Patient Instructions (Addendum)
Medication Instructions:  No changes  If you need a refill on your cardiac medications before your next appointment, please call your pharmacy.   Lab work: No new labs needed  Testing/Procedures:  Your physician has requested that you have an echocardiogram. Echocardiography is a painless test that uses sound waves to create images of your heart. It provides your doctor with information about the size and shape of your heart and how well your heart's chambers and valves are working. This procedure takes approximately one hour. There are no restrictions for this procedure.   Follow-Up: At [facility], you and your health needs are our priority.  As part of our continuing mission to provide you with exceptional heart care, we have created designated Provider Care Teams.  These Care Teams include your primary Cardiologist (physician) and Advanced Practice Providers (APPs -  Physician Assistants and Nurse Practitioners) who all work together to provide you with the care you need, when you need it.  You will need a follow up appointment in 6 months, APP OK  Providers on your designated Care Team:   Murray Hodgkins, NP Christell Faith, PA-C Cadence Kathlen Mody, [place]  COVID-19 Vaccine Information can be found at: [email or web address] For questions related to vaccine distribution or appointments, please email [email or web address] or call [phone].

## 2022-03-23 ENCOUNTER — Encounter: Payer: Medicare Other | Admitting: Physician Assistant

## 2022-03-23 DIAGNOSIS — E1151 Type 2 diabetes mellitus with diabetic peripheral angiopathy without gangrene: Secondary | ICD-10-CM | POA: Diagnosis not present

## 2022-03-23 NOTE — Progress Notes (Addendum)
[name], [name] ([id number]) Visit Report for 03/23/2022 Chief Complaint Document Details [name], [name] Date of Service: 03/23/2022 11:30 AM Medical Record Number: [id number] Patient Account Number: [id number] Date of Birth/Sex: October 28, 1933 (88 y.o. F) Treating RN: Levora Dredge Primary Care Provider: Fulton Reek Other Clinician: Referring Provider: Fulton Reek Treating Provider/Extender: Skipper Cliche in Treatment: 8 Information Obtained from: Patient Chief Complaint 07/14/2020; patient sent over here for a hemorrhagic blister on the right dorsal foot by her primary doctor Dr. Doy Hutching at [facility]. 01/26/2022; patient referred from nephrology at dialysis for review of a wound on the left dorsal foot distally Electronic Signature(s) Signed: 03/23/2022 11:44:00 AM By: Worthy Keeler PA-C Entered By: Worthy Keeler on 03/23/2022 11:44:00 [name] ([id number]) -------------------------------------------------------------------------------- HPI Details [name] Date of Service: 03/23/2022 11:30 AM Medical Record Number: [id number] Patient Account Number: [id number] Date of Birth/Sex: 15-Sep-1934 (87 y.o. F) Treating RN: Levora Dredge Primary Care Provider: Fulton Reek Other Clinician: Referring Provider: Fulton Reek Treating Provider/Extender: Skipper Cliche in Treatment: 8 History of Present Illness HPI Description: ADMISSION 07/14/2020. This is a medically complex 88 year old woman who is sent over urgently from her primary care doctor Dr. Doy Hutching at [facility] and we worked her in this afternoon. She is apparently developed a blister on the dorsal foot sometime over the last 2 weeks on the right which seems to have developed a hemorrhagic component to this. The history here is somewhat vague but this is happened since she left the hospital on 07/02/2020. She also has a blistered area on the left  dorsal foot although this does not have a hemorrhagic component to it and it is not threatened to open where is the right side is definitely leaking fluid and could easily break down. I do not believe she has been doing anything specific to this. With regards to her medical history I have reviewed the discharge summary from the hospital from 06/22/2020 through 07/02/2020. 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 She was given prednisone but was not felt to be a candidate for immunosuppression secondary to her age and comorbidities. She is ANA positive p-ANCA positive. She had a right IJ catheter placed on 9/24 and is being done on dialysis. She is a diabetic. She takes prednisone 20 mg twice daily. She was on aspirin and Plavix although I think these have been put on hold. Past medical history includes; pauci-immune crescentic glomerulonephritis. History of peripheral edema and type 2 diabetes left carotid artery stenosis with a history of a CVA, coronary artery disease and hypertension. She states that she tolerates dialysis reasonably poorly secondary to "butt cramps". She says that when she gets discomfort they stop her dialysis. This probably has something to do with her fluid overloaded state I cannot see any arterial evaluation in her record in [place] link. We could not do arterial ABIs on her because of complaints of discomfort 10/20; 1 week follow-up. This is a patient who is a diabetic. She recently developed acute renal failure because of pauci-immune glomerulonephritis. Sometime during her initial hospitalization she developed a wound on her right foot tremendous swelling in both legs. She was sent over here urgently last week by her primary care physician with what looks to be hematoma still contained on the right dorsal foot. We put silver alginate on this and put her in compression to control  some of the swelling. The left  leg had a similar appearance with severe edema in the left dorsal foot so we put that under compression as well. When she arrived back for nurse follow-up on Friday there was some drainage coming out of the wound which our nurse cultured. This is come back showing Pseudomonas Enterococcus faecalis and staph aureus although the identification of the staph aureus is not complete. I empirically put her on cefdinir 300 mg after dialysis on Tuesday Thursday and Saturday she is only had one dose. She has not been systemically unwell 10/27 1 week follow-up. With considerable help of her nursing staff I was finally able to speak to her nephrologist who manages her at dialysis. Started her on vancomycin and ceftazidime. This to cover the combination of methicillin sensitive staph aureus Enterococcus faecalis and Pseudomonas. Although the staph aureus was methicillin sensitive and the Enterococcus ampicillin sensitive vancomycin provided the best alternative here that can be given at dialysis and ceftazidime to cover the Pseudomonas. She had a traumatic wound on the top of her foot and a complicated abscess. She has significant tissue destruction from this although the wound is looking a lot better today. We are using silver alginate to the surface. X-ray of the foot did not show any deep obvious infection or osteomyelitis 12/1; since the patient was last here she was hospitalized from 11/3 through 11/10 with coag negative staphylococcal bacteremia. I do not know the the exact cause of this was determined. She was seen by vascular and they replaced her permacath on the right side of her neck. Her repeat blood cultures were negative. She is still on IV vancomycin ando Ceftazidime at dialysis. She generally feels systemically well she does not have a fever she is still receiving dialysis but reports she voids up to 8 times a day She has home health changing the dressing I believe  they are using silver alginate under 3 layer compression. In addition to the large wound on her right anterior foot that we initially saw her for she has apparently a right lateral leg wound that she suffered during a fall and 2 skin tears on the left anterior lower leg 12/8; patient comes in looking a lot better this week. One of the 2 skin tears she has on the left leg is healed the other is smaller. The area on the right lateral leg looks improved and her original wound on the right dorsal foot is a lot smaller and looks a lot better. We used Hydrofera Blue to all wound areas under 3 layer compression.. Apparently home health had some trouble with the compression wraps. 12/15 patient comes in with all of her wounds smaller including her original deep punched out wound on the right dorsal foot as well as the skin tears on the right posterior lateral calf and the left anterior lower leg. We have been using Hydrofera Blue under compression excellent results 10/12/2020 upon evaluation today patient appears to be doing decently well currently in regard to her foot ulcer. She does have a small hole still open with some tunneling that really goes proximally in the foot at around the 12:00 location. There is really not significant undermining other locations. This appears to be a small area of where there was a little fluid trapped. Nonetheless I do feel like this is still doing quite well and in general I think that she is going to benefit from possibly packing into this area with endoform in order to keep the area open while it granulates in.  11/09/2020 upon evaluation today patient appears to be doing really about the same in regard to her wound. There is a little reopening towards the distal portion of the wound bed but again I think this is just more as a result of the dressing possibly getting stuck even to little bit of the scar tissue nonetheless it also anything looks worse although I am not sure the  endoform is really doing the job for her. I think we may want to try something a little different here I think packing with a silver alginate dressing may be optimal as compared to what we tried at this point. Is been almost a month since have seen her and we really have not seen dramatic improvement. 12/07/2020 upon evaluation today patient appears to be doing well with regard to the dorsal surface of her foot. Overall I feel like this looks to be completely healed today and this is excellent news. There is no evidence of active infection which is great news. No fevers, chills, nausea, vomiting, or diarrhea. [name], [name] ([id number]) READMISSION 01/26/2022 This is an 88 year old woman that we had in clinic October 21 through March 22 with bilateral lower extremity wounds on the dorsal feet at that time she had pauci-immune glomerulonephritis but was not yet on dialysis. We managed to heal her out. As I remember she had severe bilateral lower extremity edema secondary to acute on chronic renal failure. She arrives in clinic this time with a wound on her distal left dorsal foot that is been there for about 6 months. Completely eschar covered she has been treating this with Neosporin. She said she is on IV vancomycin and cefepime at dialysis although we have no independent verification of this. This is due to be finished on 01/30/2022. She says it was for this foot but she does not say that she has had x-rays and MRI etc. Past medical history is extensive; she has type 2 diabetes although not currently in any treatment, end-stage renal disease on dialysis Monday Wednesday and Friday, coronary artery disease status post CABG, combined systolic and diastolic heart failure, peripheral vascular disease, cerebrovascular disease. Carotid artery stenosis, COPD, breast cancer ABI in our clinic was 0.79. I cannot find that she has had previous noninvasive arterial studies. She is only able to walk around the  house does not describe claudication that I can elicit Upon inspection patient's wounds actually appear to be doing decently well. On the left foot this is mainly dry drainage, bleeding, and Iodoflex which is stuck on the surface of the wound I was able to get that off mechanically she did not want any sharp debridement whatsoever fortunately is able to get it off mechanically without using that just using saline and gauze. Subsequently I think this is otherwise doing quite well. On the right leg she does have swelling and she had a blister that popped up this feels like it almost completely sealed down but it still weeping and leaking a little bit I think we need to manage that as well to be honest. 02-14-2022 upon evaluation today patient appears to be doing well currently in regard to the wound on her foot. She has been tolerating the dressing changes there is a little bit of film buildup here in the patient last week was very resistant to debridement because her ABIs were somewhat low I was avoiding that as well. However she did see Dr. Lazaro Arms and he apparently told her that her ABIs were good everything  appeared to be fine and to be honest I think this is excellent news. I am very pleased to hear this and I do believe doing some sharp debridement would be helpful at this point as well. 02-21-2022 upon evaluation today patient appears to be doing well with regard to her wounds. Both are showing signs of improvement this is great news. Fortunately I do not see any evidence of active infection locally or systemically at this time which is great news. No fevers, chills, nausea, vomiting, or diarrhea. 03-02-2022 upon evaluation today patient appears to be doing better in regard to her right leg which appears to be healed. In regards to her left leg this on the foot at the top still has a small opening at this time. Fortunately there does not appear to be any signs of infection. With that being said I do  think we may want to switch over to collagen based dressing to see if we get this to feeling a little bit better. 03-09-2022 upon evaluation today patient appears to be doing well currently in regard to her wound on the top of her left foot. I am very pleased with where things stand and I think we are headed in the right direction here. She is still having a lot of trouble with the compression wrap. She is doing extremely well on the right side with the Tubigrip so I think we can probably switch over to using the Tubigrip on the left side as well especially since the collagen is getting so dry I do not believe this is doing her as much good as I would like for her to to be honest. 03-23-2022 upon evaluation today patient appears to be doing well with regard to her foot ulcer. This is pretty much healed at this point. Fortunately I see no signs of infection which is great news. The biggest issue I think is good to be keeping her edema under control. The Tubigrip is not very tight but unfortunately rolls down the cuts and on the top of her leg I think she needs something to try to help keep this under better control more evenly she has done well she tells me in the past with an Ace wrap I think we can probably give that a shot. Electronic Signature(s) Signed: 03/23/2022 1:20:45 PM By: Worthy Keeler PA-C Entered By: Worthy Keeler on 03/23/2022 13:20:45 [name] ([id number]) -------------------------------------------------------------------------------- Physical Exam Details [name] Date of Service: 03/23/2022 11:30 AM Medical Record Number: [id number] Patient Account Number: [id number] Date of Birth/Sex: 1933-10-27 (87 y.o. F) Treating RN: Levora Dredge Primary Care Provider: Fulton Reek Other Clinician: Referring Provider: Fulton Reek Treating Provider/Extender: Skipper Cliche in Treatment: 8 Constitutional Well-nourished and well-hydrated in no  acute distress. Respiratory normal breathing without difficulty. Psychiatric this patient is able to make decisions and demonstrates good insight into disease process. Alert and Oriented x 3. pleasant and cooperative. Notes Upon inspection patient's wound really does not appear to be open she is to have some weeping at this point and I think that we can definitely see about getting the patient going with regard to an Ace wrap to try to help out with this I would recommend to cover bandages to catch any drainage and we will subsequently see how things progress. She tells me that "she is not coming back after today." Electronic Signature(s) Signed: 03/23/2022 1:21:14 PM By: Worthy Keeler PA-C Entered By: Worthy Keeler on 03/23/2022 13:21:14  [name], [name] ([id number]) -------------------------------------------------------------------------------- Physician Orders Details [name] Date of Service: 03/23/2022 11:30 AM Medical Record Number: [id number] Patient Account Number: [id number] Date of Birth/Sex: 09-Jan-1934 (87 y.o. F) Treating RN: Levora Dredge Primary Care Provider: Fulton Reek Other Clinician: Referring Provider: Fulton Reek Treating Provider/Extender: Skipper Cliche in Treatment: 8 Verbal / Phone Orders: No Diagnosis Coding ICD-10 Coding Code Description E11.621 Type 2 diabetes mellitus with foot ulcer E11.51 Type 2 diabetes mellitus with diabetic peripheral angiopathy without gangrene I12.0 Hypertensive chronic kidney disease with stage 5 chronic kidney disease or end stage renal disease L97.528 Non-pressure chronic ulcer of other part of left foot with other specified severity Discharge From [facility] o Discharge from [facility] Complete - change band aid daily to keep area clean and dry. Please call with any issues. o Wear compression garments daily. Put garments on first thing when you wake up and remove  them before bed. - use ace wraps daily on bilateral lower extremities o Moisturize legs daily after removing compression garments. o Elevate, Exercise Daily and Avoid Standing for Long Periods of Time. o DO YOUR BEST to sleep in the bed at night. DO NOT sleep in your recliner. Long hours of sitting in a recliner leads to swelling of the legs and/or potential wounds on your backside. Electronic Signature(s) Signed: 03/23/2022 1:22:53 PM By: Levora Dredge Entered By: Levora Dredge on 03/23/2022 13:22:53 [name] ([id number]) -------------------------------------------------------------------------------- Problem List Details [name] Date of Service: 03/23/2022 11:30 AM Medical Record Number: [id number] Patient Account Number: [id number] Date of Birth/Sex: 1934/05/19 (88 y.o. F) Treating RN: Levora Dredge Primary Care Provider: Fulton Reek Other Clinician: Referring Provider: Fulton Reek Treating Provider/Extender: Skipper Cliche in Treatment: 8 Active Problems ICD-10 Encounter Code Description Active Date MDM Diagnosis E11.621 Type 2 diabetes mellitus with foot ulcer 01/26/2022 No Yes E11.51 Type 2 diabetes mellitus with diabetic peripheral angiopathy without 01/26/2022 No Yes gangrene I12.0 Hypertensive chronic kidney disease with stage 5 chronic kidney disease 01/26/2022 No Yes or end stage renal disease L97.528 Non-pressure chronic ulcer of other part of left foot with other specified 01/26/2022 No Yes severity Inactive Problems Resolved Problems Electronic Signature(s) Signed: 03/23/2022 11:43:46 AM By: Worthy Keeler PA-C Entered By: Worthy Keeler on 03/23/2022 11:43:46 [name] ([id number]) -------------------------------------------------------------------------------- Progress Note Details [name] Date of Service: 03/23/2022 11:30 AM Medical Record Number: [id number] Patient  Account Number: [id number] Date of Birth/Sex: 1934-06-08 (87 y.o. F) Treating RN: Levora Dredge Primary Care Provider: Fulton Reek Other Clinician: Referring Provider: Fulton Reek Treating Provider/Extender: Skipper Cliche in Treatment: 8 Subjective Chief Complaint Information obtained from Patient 07/14/2020; patient sent over here for a hemorrhagic blister on the right dorsal foot by her primary doctor Dr. Doy Hutching at [facility]. 01/26/2022; patient referred from nephrology at dialysis for review of a wound on the left dorsal foot distally History of Present Illness (HPI) ADMISSION 07/14/2020. This is a medically complex 88 year old woman who is sent over urgently from her primary care doctor Dr. Doy Hutching at [facility] and we worked her in this afternoon. She is apparently developed a blister on the dorsal foot sometime over the last 2 weeks on the right which seems to have developed a hemorrhagic component to this. The history here is somewhat vague but this is happened since she left the hospital on 07/02/2020. She also has a blistered area on the left dorsal foot although this does not  have a hemorrhagic component to it and it is not threatened to open where is the right side is definitely leaking fluid and could easily break down. I do not believe she has been doing anything specific to this. With regards to her medical history I have reviewed the discharge summary from the hospital from 06/22/2020 through 07/02/2020. 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 She was given prednisone but was not felt to be a candidate for immunosuppression secondary to her age and comorbidities. She is ANA positive p-ANCA positive. She had a right IJ catheter placed on 9/24 and is being done on dialysis. She is a diabetic. She takes prednisone 20 mg twice daily. She was on aspirin and Plavix although I  think these have been put on hold. Past medical history includes; pauci-immune crescentic glomerulonephritis. History of peripheral edema and type 2 diabetes left carotid artery stenosis with a history of a CVA, coronary artery disease and hypertension. She states that she tolerates dialysis reasonably poorly secondary to "butt cramps". She says that when she gets discomfort they stop her dialysis. This probably has something to do with her fluid overloaded state I cannot see any arterial evaluation in her record in [place] link. We could not do arterial ABIs on her because of complaints of discomfort 10/20; 1 week follow-up. This is a patient who is a diabetic. She recently developed acute renal failure because of pauci-immune glomerulonephritis. Sometime during her initial hospitalization she developed a wound on her right foot tremendous swelling in both legs. She was sent over here urgently last week by her primary care physician with what looks to be hematoma still contained on the right dorsal foot. We put silver alginate on this and put her in compression to control some of the swelling. The left leg had a similar appearance with severe edema in the left dorsal foot so we put that under compression as well. When she arrived back for nurse follow-up on Friday there was some drainage coming out of the wound which our nurse cultured. This is come back showing Pseudomonas Enterococcus faecalis and staph aureus although the identification of the staph aureus is not complete. I empirically put her on cefdinir 300 mg after dialysis on Tuesday Thursday and Saturday she is only had one dose. She has not been systemically unwell 10/27 1 week follow-up. With considerable help of her nursing staff I was finally able to speak to her nephrologist who manages her at dialysis. Started her on vancomycin and ceftazidime. This to cover the combination of methicillin sensitive staph aureus Enterococcus faecalis  and Pseudomonas. Although the staph aureus was methicillin sensitive and the Enterococcus ampicillin sensitive vancomycin provided the best alternative here that can be given at dialysis and ceftazidime to cover the Pseudomonas. She had a traumatic wound on the top of her foot and a complicated abscess. She has significant tissue destruction from this although the wound is looking a lot better today. We are using silver alginate to the surface. X-ray of the foot did not show any deep obvious infection or osteomyelitis 12/1; since the patient was last here she was hospitalized from 11/3 through 11/10 with coag negative staphylococcal bacteremia. I do not know the the exact cause of this was determined. She was seen by vascular and they replaced her permacath on the right side of her neck. Her repeat blood cultures were negative. She is still on IV vancomycin ando Ceftazidime at  dialysis. She generally feels systemically well she does not have a fever she is still receiving dialysis but reports she voids up to 8 times a day She has home health changing the dressing I believe they are using silver alginate under 3 layer compression. In addition to the large wound on her right anterior foot that we initially saw her for she has apparently a right lateral leg wound that she suffered during a fall and 2 skin tears on the left anterior lower leg 12/8; patient comes in looking a lot better this week. One of the 2 skin tears she has on the left leg is healed the other is smaller. The area on the right lateral leg looks improved and her original wound on the right dorsal foot is a lot smaller and looks a lot better. We used Hydrofera Blue to all wound areas under 3 layer compression.. Apparently home health had some trouble with the compression wraps. 12/15 patient comes in with all of her wounds smaller including her original deep punched out wound on the right dorsal foot as well as the skin tears on the  right posterior lateral calf and the left anterior lower leg. We have been using Hydrofera Blue under compression excellent results 10/12/2020 upon evaluation today patient appears to be doing decently well currently in regard to her foot ulcer. She does have a small hole still open with some tunneling that really goes proximally in the foot at around the 12:00 location. There is really not significant undermining other locations. This appears to be a small area of where there was a little fluid trapped. Nonetheless I do feel like this is still doing quite well and in general I think that she is going to benefit from possibly packing into this area with endoform in order to keep the area open while it granulates in. 11/09/2020 upon evaluation today patient appears to be doing really about the same in regard to her wound. There is a little reopening towards the distal portion of the wound bed but again I think this is just more as a result of the dressing possibly getting stuck even to little bit of the scar tissue nonetheless it also anything looks worse although I am not sure the endoform is really doing the job for her. I think we may want to try [name], Sarah T. ([id number]) something a little different here I think packing with a silver alginate dressing may be optimal as compared to what we tried at this point. Is been almost a month since have seen her and we really have not seen dramatic improvement. 12/07/2020 upon evaluation today patient appears to be doing well with regard to the dorsal surface of her foot. Overall I feel like this looks to be completely healed today and this is excellent news. There is no evidence of active infection which is great news. No fevers, chills, nausea, vomiting, or diarrhea. 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 We managed to heal her out. As I remember she had severe bilateral lower extremity edema secondary to acute on chronic renal failure. She arrives in clinic this time with a wound on her distal left dorsal foot that is been there for about 6 months. Completely eschar covered she has been treating this with Neosporin. She said she  is on IV vancomycin and cefepime at dialysis although we have no independent verification of this. This is due to be finished on 01/30/2022. She says it was for this foot but she does not say that she has had x-rays and MRI etc. Past medical history is extensive; she has type 2 diabetes although not currently in any treatment, end-stage renal disease on dialysis Monday Wednesday and Friday, coronary artery disease status post CABG, combined systolic and diastolic heart failure, peripheral vascular disease, cerebrovascular disease. Carotid artery stenosis, COPD, breast cancer ABI in our clinic was 0.79. I cannot find that she has had previous noninvasive arterial studies. She is only able to walk around the house does not describe claudication that I can elicit Upon inspection patient's wounds actually appear to be doing decently well. On the left foot this is mainly dry drainage, bleeding, and Iodoflex which is stuck on the surface of the wound I was able to get that off mechanically she did not want any sharp debridement whatsoever fortunately is able to get it off mechanically without using that just using saline and gauze. Subsequently I think this is otherwise doing quite well. On the right leg she does have swelling and she had a blister that popped up this feels like it almost completely sealed down but it still weeping and leaking a little bit I think we need to manage that as well to be honest. 02-14-2022 upon evaluation today patient appears to be doing well currently in regard to the wound on her foot. She has  been tolerating the dressing changes there is a little bit of film buildup here in the patient last week was very resistant to debridement because her ABIs were somewhat low I was avoiding that as well. However she did see Dr. Lazaro Arms and he apparently told her that her ABIs were good everything appeared to be fine and to be honest I think this is excellent news. I am very pleased to hear this and I do believe doing some sharp debridement would be helpful at this point as well. 02-21-2022 upon evaluation today patient appears to be doing well with regard to her wounds. Both are showing signs of improvement this is great news. Fortunately I do not see any evidence of active infection locally or systemically at this time which is great news. No fevers, chills, nausea, vomiting, or diarrhea. 03-02-2022 upon evaluation today patient appears to be doing better in regard to her right leg which appears to be healed. In regards to her left leg this on the foot at the top still has a small opening at this time. Fortunately there does not appear to be any signs of infection. With that being said I do think we may want to switch over to collagen based dressing to see if we get this to feeling a little bit better. 03-09-2022 upon evaluation today patient appears to be doing well currently in regard to her wound on the top of her left foot. I am very pleased with where things stand and I think we are headed in the right direction here. She is still having a lot of trouble with the compression wrap. She is doing extremely well on the right side with the Tubigrip so I think we can probably switch over to using the Tubigrip on the left side as well especially since the collagen is getting so dry I do not believe this is doing her as much good as I would like for her to  to be honest. 03-23-2022 upon evaluation today patient appears to be doing well with regard to her foot ulcer. This is pretty much healed at this  point. Fortunately I see no signs of infection which is great news. The biggest issue I think is good to be keeping her edema under control. The Tubigrip is not very tight but unfortunately rolls down the cuts and on the top of her leg I think she needs something to try to help keep this under better control more evenly she has done well she tells me in the past with an Ace wrap I think we can probably give that a shot. Objective Constitutional Well-nourished and well-hydrated in no acute distress. Vitals Time Taken: 12:00 PM, Height: 64 in, Weight: 160 lbs, BMI: 27.5, Temperature: 97.7 F, Pulse: 77 bpm, Respiratory Rate: 18 breaths/min, Blood Pressure: 156/68 mmHg. Respiratory normal breathing without difficulty. Psychiatric this patient is able to make decisions and demonstrates good insight into disease process. Alert and Oriented x 3. pleasant and cooperative. [name] ([id number]) General Notes: Upon inspection patient's wound really does not appear to be open she is to have some weeping at this point and I think that we can definitely see about getting the patient going with regard to an Ace wrap to try to help out with this I would recommend to cover bandages to catch any drainage and we will subsequently see how things progress. She tells me that "she is not coming back after today." Integumentary (Hair, Skin) Wound #5 status is Open. Original cause of wound was Gradually Appeared. The date acquired was: 08/02/2021. The wound has been in treatment 8 weeks. The wound is located on the Left,Dorsal Foot. The wound measures 0cm length x 0cm width x 0cm depth; 0cm^2 area and 0cm^3 volume. There is no tunneling or undermining noted. There is a none present amount of drainage noted. There is no granulation within the wound bed. There is no necrotic tissue within the wound bed. Assessment Active Problems ICD-10 Type 2 diabetes mellitus with foot ulcer Type 2 diabetes mellitus  with diabetic peripheral angiopathy without gangrene Hypertensive chronic kidney disease with stage 5 chronic kidney disease or end stage renal disease Non-pressure chronic ulcer of other part of left foot with other specified severity Plan 1. I would recommend currently that we use a cover dressing such as a large Band-Aid in order to catch any drainage on the top of the foot. I think this is going to do a good job here. 2. I am can recommend that she use an Ace wrap she does not want to have any additional follow-ups with me which I am okay with as long as things continue to do okay. If she has any concerns or questions she should contact the office and let me know. I will see her back for follow-up visit as needed at this point. Electronic Signature(s) Signed: 03/23/2022 1:22:19 PM By: Worthy Keeler PA-C Previous Signature: 03/23/2022 1:21:48 PM Version By: Worthy Keeler PA-C Entered By: Worthy Keeler on 03/23/2022 13:22:19 [name] ([id number]) -------------------------------------------------------------------------------- SuperBill Details [name] Date of Service: 03/23/2022 Medical Record Number: [id number] Patient Account Number: [id number] Date of Birth/Sex: 27-Jun-1934 (87 y.o. F) Treating RN: Levora Dredge Primary Care Provider: Fulton Reek Other Clinician: Referring Provider: Fulton Reek Treating Provider/Extender: Skipper Cliche in Treatment: 8 Diagnosis Coding ICD-10 Codes Code Description E11.621 Type 2 diabetes mellitus with foot ulcer E11.51 Type 2 diabetes mellitus with diabetic  peripheral angiopathy without gangrene I12.0 Hypertensive chronic kidney disease with stage 5 chronic kidney disease or end stage renal disease L97.528 Non-pressure chronic ulcer of other part of left foot with other specified severity Facility Procedures CPT4 Code: 57493552 Description: 99213 - WOUND CARE VISIT-LEV 3 EST  PT Modifier: Quantity: 1 Physician Procedures CPT4 Code Description: [id number] 99213 - WC PHYS LEVEL 3 - EST PT Modifier: Quantity: 1 CPT4 Code Description: ICD-10 Diagnosis Description E11.621 Type 2 diabetes mellitus with foot ulcer E11.51 Type 2 diabetes mellitus with diabetic peripheral angiopathy without ga I12.0 Hypertensive chronic kidney disease with stage 5 chronic kidney  disease L97.528 Non-pressure chronic ulcer of other part of left foot with other specif Modifier: ngrene or end stage renal d ied severity Quantity: isease Electronic Signature(s) Signed: 03/23/2022 1:23:45 PM By: Levora Dredge Previous Signature: 03/23/2022 1:22:01 PM Version By: Worthy Keeler PA-C Entered By: Levora Dredge on 03/23/2022 13:23:44

## 2022-03-23 NOTE — Progress Notes (Addendum)
[name], [name] ([id number]) Visit Report for 03/23/2022 Arrival Information Details [name] Date of Service: 03/23/2022 11:30 AM Medical Record Number: [id number] Patient Account Number: [id number] Date of Birth/Sex: Apr 13, 1934 (88 y.o. F) Treating RN: Levora Dredge Primary Care Carlisia Geno: Fulton Reek Other Clinician: Referring Lynasia Meloche: Fulton Reek Treating Ravon Mortellaro/Extender: Skipper Cliche in Treatment: 8 Visit Information History Since Last Visit Added or deleted any medications: No Patient Arrived: Wheel Chair Any new allergies or adverse reactions: No Arrival Time: 12:03 Had a fall or experienced change in No Accompanied By: husband activities of daily living that may affect Transfer Assistance: None risk of falls: Patient Identification Verified: Yes Hospitalized since last visit: No Secondary Verification Process Completed: Yes Has Dressing in Place as Prescribed: Yes Patient Requires Transmission-Based No Has Compression in Place as Prescribed: Yes Precautions: Pain Present Now: No Patient Has Alerts: Yes Patient Alerts: Patient on Blood Thinner ABI R/L [place] 02/16/22 TBI 02/16/22 R 1.02 L 0.79 Electronic Signature(s) Signed: 03/23/2022 4:30:31 PM By: Levora Dredge Entered By: Levora Dredge on 03/23/2022 12:03:32 [name] ([id number]) -------------------------------------------------------------------------------- Clinic Level of Care Assessment Details [name] Date of Service: 03/23/2022 11:30 AM Medical Record Number: [id number] Patient Account Number: [id number] Date of Birth/Sex: 1934-05-12 (87 y.o. F) Treating RN: Levora Dredge Primary Care Milas Schappell: Fulton Reek Other Clinician: Referring Jeny Nield: Fulton Reek Treating Jarica Plass/Extender: Skipper Cliche in Treatment: 8 Clinic Level of Care Assessment Items TOOL 4 Quantity Score '[]'$  - Use when only an EandM is performed on  FOLLOW-UP visit 0 ASSESSMENTS - Nursing Assessment / Reassessment X - Reassessment of Co-morbidities (includes updates in patient status) 1 10 X- 1 5 Reassessment of Adherence to Treatment Plan ASSESSMENTS - Wound and Skin Assessment / Reassessment X - Simple Wound Assessment / Reassessment - one wound 1 5 '[]'$  - 0 Complex Wound Assessment / Reassessment - multiple wounds '[]'$  - 0 Dermatologic / Skin Assessment (not related to wound area) ASSESSMENTS - Focused Assessment X - Circumferential Edema Measurements - multi extremities 1 5 '[]'$  - 0 Nutritional Assessment / Counseling / Intervention '[]'$  - 0 Lower Extremity Assessment (monofilament, tuning fork, pulses) '[]'$  - 0 Peripheral Arterial Disease Assessment (using hand held doppler) ASSESSMENTS - Ostomy and/or Continence Assessment and Care '[]'$  - Incontinence Assessment and Management 0 '[]'$  - 0 Ostomy Care Assessment and Management (repouching, etc.) PROCESS - Coordination of Care X - Simple Patient / Family Education for ongoing care 1 15 '[]'$  - 0 Complex (extensive) Patient / Family Education for ongoing care '[]'$  - 0 Staff obtains Programmer, systems, Records, Test Results / Process Orders '[]'$  - 0 Staff telephones HHA, Nursing Homes / Clarify orders / etc '[]'$  - 0 Routine Transfer to another Facility (non-emergent condition) '[]'$  - 0 Routine Hospital Admission (non-emergent condition) '[]'$  - 0 New Admissions / Biomedical engineer / Ordering NPWT, Apligraf, etc. '[]'$  - 0 Emergency Hospital Admission (emergent condition) X- 1 10 Simple Discharge Coordination '[]'$  - 0 Complex (extensive) Discharge Coordination PROCESS - Special Needs '[]'$  - Pediatric / Minor Patient Management 0 '[]'$  - 0 Isolation Patient Management '[]'$  - 0 Hearing / Language / Visual special needs '[]'$  - 0 Assessment of Community assistance (transportation, D/C planning, etc.) '[]'$  - 0 Additional assistance / Altered mentation '[]'$  - 0 Support Surface(s) Assessment (bed, cushion, seat,  etc.) INTERVENTIONS - Wound Cleansing / Measurement Demetriou, Dajanae T. ([id number]) X- 1 5 Simple Wound Cleansing - one wound '[]'$  - 0 Complex Wound Cleansing - multiple wounds X- 1  5 Wound Imaging (photographs - any number of wounds) '[]'$  - 0 Wound Tracing (instead of photographs) X- 1 5 Simple Wound Measurement - one wound '[]'$  - 0 Complex Wound Measurement - multiple wounds INTERVENTIONS - Wound Dressings '[]'$  - Small Wound Dressing one or multiple wounds 0 '[]'$  - 0 Medium Wound Dressing one or multiple wounds '[]'$  - 0 Large Wound Dressing one or multiple wounds X- 1 5 Application of Medications - topical '[]'$  - 0 Application of Medications - injection INTERVENTIONS - Miscellaneous '[]'$  - External ear exam 0 '[]'$  - 0 Specimen Collection (cultures, biopsies, blood, body fluids, etc.) '[]'$  - 0 Specimen(s) / Culture(s) sent or taken to Lab for analysis X- 1 10 Patient Transfer (multiple staff / Civil Service fast streamer / Similar devices) '[]'$  - 0 Simple Staple / Suture removal (25 or less) '[]'$  - 0 Complex Staple / Suture removal (26 or more) '[]'$  - 0 Hypo / Hyperglycemic Management (close monitor of Blood Glucose) '[]'$  - 0 Ankle / Brachial Index (ABI) - do not check if billed separately X- 1 5 Vital Signs Has the patient been seen at the hospital within the last three years: Yes Total Score: 85 Level Of Care: New/Established - Level 3 Electronic Signature(s) Signed: 03/23/2022 4:30:31 PM By: Levora Dredge Entered By: Levora Dredge on 03/23/2022 13:23:37 [name] ([id number]) -------------------------------------------------------------------------------- Encounter Discharge Information Details [name] Date of Service: 03/23/2022 11:30 AM Medical Record Number: [id number] Patient Account Number: [id number] Date of Birth/Sex: 05-24-1934 (88 y.o. F) Treating RN: Levora Dredge Primary Care Corby Villasenor: Fulton Reek Other Clinician: Referring Jerusalem Brownstein: Fulton Reek Treating Neiva Maenza/Extender: Skipper Cliche in Treatment: 8 Encounter Discharge Information Items Discharge Condition: Stable Ambulatory Status: Wheelchair Discharge Destination: Home Transportation: Private Auto Accompanied By: husband Schedule Follow-up Appointment: Yes Clinical Summary of Care: Electronic Signature(s) Signed: 03/23/2022 1:24:28 PM By: Levora Dredge Entered By: Levora Dredge on 03/23/2022 13:24:28 [name] ([id number]) -------------------------------------------------------------------------------- Lower Extremity Assessment Details [name] Date of Service: 03/23/2022 11:30 AM Medical Record Number: [id number] Patient Account Number: [id number] Date of Birth/Sex: 06-28-1934 (87 y.o. F) Treating RN: Levora Dredge Primary Care Sherryll Skoczylas: Fulton Reek Other Clinician: Referring Rianne Degraaf: Fulton Reek Treating Tineshia Becraft/Extender: Skipper Cliche in Treatment: 8 Edema Assessment Assessed: [Left: No] [Right: No] Edema: [Left: Ye] [Right: s] Calf Left: Right: Point of Measurement: 30 cm From Medial Instep 40.8 cm Ankle Left: Right: Point of Measurement: 10 cm From Medial Instep 28.8 cm Vascular Assessment Pulses: Dorsalis Pedis Doppler Audible: [Left:Yes] Electronic Signature(s) Signed: 03/23/2022 4:30:31 PM By: Levora Dredge Entered By: Levora Dredge on 03/23/2022 12:05:29 [name] ([id number]) -------------------------------------------------------------------------------- Multi Wound Chart Details [name] Date of Service: 03/23/2022 11:30 AM Medical Record Number: [id number] Patient Account Number: [id number] Date of Birth/Sex: 09/22/1934 (88 y.o. F) Treating RN: Levora Dredge Primary Care Shanin Szymanowski: Fulton Reek Other Clinician: Referring Tonisha Silvey: Fulton Reek Treating Tashiana Lamarca/Extender: Skipper Cliche in Treatment: 8 Vital Signs Height(in):  64 Pulse(bpm): 29 Weight(lbs): 160 Blood Pressure(mmHg): 156/68 Body Mass Index(BMI): 27.5 Temperature(F): 97.7 Respiratory Rate(breaths/min): 18 Photos: [N/A:N/A] Wound Location: Left, Dorsal Foot N/A N/A Wounding Event: Gradually Appeared N/A N/A Primary Etiology: Diabetic Wound/Ulcer of the Lower N/A N/A Extremity Comorbid History: Congestive Heart Failure, N/A N/A Hypertension, Type II Diabetes, Received Radiation Date Acquired: 08/02/2021 N/A N/A Weeks of Treatment: 8 N/A N/A Wound Status: Open N/A N/A Wound Recurrence: No N/A N/A Measurements L x W x D (cm) 0.3x0.3x0.1 N/A N/A Area (cm) : 0.071 N/A  N/A Volume (cm) : 0.007 N/A N/A % Reduction in Area: 94.70% N/A N/A % Reduction in Volume: 94.80% N/A N/A Classification: Grade 2 N/A N/A Exudate Amount: Medium N/A N/A Exudate Type: Serosanguineous N/A N/A Exudate Color: red, brown N/A N/A Granulation Amount: Small (1-33%) N/A N/A Granulation Quality: Pink, Pale N/A N/A Necrotic Amount: Large (67-100%) N/A N/A Exposed Structures: Fat Layer (Subcutaneous Tissue): N/A N/A Yes Fascia: No Tendon: No Muscle: No Joint: No Bone: No Epithelialization: Small (1-33%) N/A N/A Treatment Notes Electronic Signature(s) Signed: 03/23/2022 4:30:31 PM By: Levora Dredge Entered By: Levora Dredge on 03/23/2022 12:14:44 [name] ([id number]) -------------------------------------------------------------------------------- Multi-Disciplinary Care Plan Details [name] Date of Service: 03/23/2022 11:30 AM Medical Record Number: [id number] Patient Account Number: [id number] Date of Birth/Sex: 03/01/1934 (88 y.o. F) Treating RN: Levora Dredge Primary Care Salli Bodin: Fulton Reek Other Clinician: Referring Venissa Nappi: Fulton Reek Treating Cullan Launer/Extender: Skipper Cliche in Treatment: 8 Active Inactive Electronic Signature(s) Signed: 03/23/2022 1:28:15 PM By: Levora Dredge Entered By:  Levora Dredge on 03/23/2022 13:28:15 [name] ([id number]) -------------------------------------------------------------------------------- Pain Assessment Details [name] Date of Service: 03/23/2022 11:30 AM Medical Record Number: [id number] Patient Account Number: [id number] Date of Birth/Sex: 03-May-1934 (88 y.o. F) Treating RN: Levora Dredge Primary Care Josie Mesa: Fulton Reek Other Clinician: Referring Benett Swoyer: Fulton Reek Treating Momoka Stringfield/Extender: Skipper Cliche in Treatment: 8 Active Problems Location of Pain Severity and Description of Pain Patient Has Paino No Site Locations Rate the pain. Current Pain Level: 0 Pain Management and Medication Current Pain Management: Electronic Signature(s) Signed: 03/23/2022 4:30:31 PM By: Levora Dredge Entered By: Levora Dredge on 03/23/2022 12:03:56 [name] ([id number]) -------------------------------------------------------------------------------- Patient/Caregiver Education Details [name] Date of Service: 03/23/2022 11:30 AM Medical Record Number: [id number] Patient Account Number: [id number] Date of Birth/Gender: 03-20-1934 (88 y.o. F) Treating RN: Levora Dredge Primary Care Physician: Fulton Reek Other Clinician: Referring Physician: Fulton Reek Treating Physician/Extender: Skipper Cliche in Treatment: 8 Education Assessment Education Provided To: Patient and Caregiver Education Topics Provided Wound/Skin Impairment: Handouts: Other: care of healed wound Methods: Explain/Verbal Responses: State content correctly Electronic Signature(s) Signed: 03/23/2022 4:30:31 PM By: Levora Dredge Entered By: Levora Dredge on 03/23/2022 13:24:01 [name] ([id number]) -------------------------------------------------------------------------------- Wound Assessment Details [name] Date of  Service: 03/23/2022 11:30 AM Medical Record Number: [id number] Patient Account Number: [id number] Date of Birth/Sex: 1934/01/17 (88 y.o. F) Treating RN: Levora Dredge Primary Care Syeda Prickett: Fulton Reek Other Clinician: Referring Jaspal Pultz: Fulton Reek Treating Lleyton Byers/Extender: Skipper Cliche in Treatment: 8 Wound Status Wound Number: 5 Primary Diabetic Wound/Ulcer of the Lower Extremity Etiology: Wound Location: Left, Dorsal Foot Wound Status: Open Wounding Event: Gradually Appeared Comorbid Congestive Heart Failure, Hypertension, Type II Date Acquired: 08/02/2021 History: Diabetes, Received Radiation Weeks Of Treatment: 8 Clustered Wound: No Photos Wound Measurements Length: (cm) 0 Width: (cm) 0 Depth: (cm) 0 Area: (cm) 0 Volume: (cm) 0 % Reduction in Area: 100% % Reduction in Volume: 100% Epithelialization: Large (67-100%) Tunneling: No Undermining: No Wound Description Classification: Grade 2 Exudate Amount: None Present Foul Odor After Cleansing: No Slough/Fibrino Yes Wound Bed Granulation Amount: None Present (0%) Exposed Structure Necrotic Amount: None Present (0%) Fascia Exposed: No Fat Layer (Subcutaneous Tissue) Exposed: No Tendon Exposed: No Muscle Exposed: No Joint Exposed: No Bone Exposed: No Electronic Signature(s) Signed: 03/23/2022 4:30:31 PM By: Levora Dredge Entered By: Levora Dredge on 03/23/2022 12:16:36 [name] ([id number]) -------------------------------------------------------------------------------- Vitals Details [name] Date of  Service: 03/23/2022 11:30 AM Medical Record Number: [id number] Patient Account Number: [id number] Date of Birth/Sex: 08-15-1934 (88 y.o. F) Treating RN: Levora Dredge Primary Care Dell Hurtubise: Fulton Reek Other Clinician: Referring Talor Cheema: Fulton Reek Treating Lania Zawistowski/Extender: Skipper Cliche in Treatment: 8 Vital Signs Time Taken:  12:00 Temperature (F): 97.7 Height (in): 64 Pulse (bpm): 77 Weight (lbs): 160 Respiratory Rate (breaths/min): 18 Body Mass Index (BMI): 27.5 Blood Pressure (mmHg): 156/68 Reference Range: 80 - 120 mg / dl Electronic Signature(s) Signed: 03/23/2022 4:30:31 PM By: Levora Dredge Entered By: Levora Dredge on 03/23/2022 12:03:49

## 2022-04-06 ENCOUNTER — Telehealth: Payer: Self-pay | Admitting: Primary Care

## 2022-04-06 ENCOUNTER — Telehealth (INDEPENDENT_AMBULATORY_CARE_PROVIDER_SITE_OTHER): Payer: Self-pay | Admitting: Vascular Surgery

## 2022-04-06 NOTE — Telephone Encounter (Signed)
Spoke with Shirlee Limerick at [facility] and she states that the pt needs to contact her pulmonologist in order to get recertification orders for oxygen.  I called pt and explained to her to call her pulmonologist and she stated verbal understanding.

## 2022-04-06 NOTE — Telephone Encounter (Signed)
Pt states she is on oxygen prescribed by Dr Lucky Cowboy, [facility] is needing an order for the oxygen from Dr Lucky Cowboy. Please advise

## 2022-04-06 NOTE — Telephone Encounter (Signed)
Can we reach out to [facility] to see what they need for this rx or is there something they can send that we can sign

## 2022-04-06 NOTE — Telephone Encounter (Signed)
Patient called wanting to make an appointment to verify that she is taking some kind of medicine to have proof to send to  [facility].  Please advise.

## 2022-04-06 NOTE — Telephone Encounter (Signed)
Spoke to patient. She stated that [employer] is requesting documentation for O2 re cert.   Lm for Melissa with [employer] for more info.

## 2022-04-07 NOTE — Telephone Encounter (Signed)
Spoke to [place] with [employer]. She requested that I send a community message requesting that they pull documents from epic. Message has been sent.  Lm for patient to give update.

## 2022-04-07 NOTE — Telephone Encounter (Signed)
Noted.  Will close encounter.  

## 2022-04-27 ENCOUNTER — Ambulatory Visit (INDEPENDENT_AMBULATORY_CARE_PROVIDER_SITE_OTHER): Payer: Medicare Other

## 2022-04-27 DIAGNOSIS — I083 Combined rheumatic disorders of mitral, aortic and tricuspid valves: Secondary | ICD-10-CM

## 2022-04-27 DIAGNOSIS — I429 Cardiomyopathy, unspecified: Secondary | ICD-10-CM | POA: Diagnosis not present

## 2022-04-27 LAB — ECHOCARDIOGRAM COMPLETE
AR max vel: 1.41 cm2
AV Area VTI: 1.46 cm2
AV Area mean vel: 1.25 cm2
AV Mean grad: 9.5 mmHg
AV Peak grad: 15.2 mmHg
Ao pk vel: 1.95 m/s
Area-P 1/2: 2.85 cm2
Calc EF: 54.8 %
S' Lateral: 4.3 cm
Single Plane A2C EF: 37.8 %
Single Plane A4C EF: 61.1 %

## 2022-05-02 ENCOUNTER — Encounter (INDEPENDENT_AMBULATORY_CARE_PROVIDER_SITE_OTHER): Payer: Medicare Other

## 2022-05-02 ENCOUNTER — Ambulatory Visit (INDEPENDENT_AMBULATORY_CARE_PROVIDER_SITE_OTHER): Payer: Medicare Other | Admitting: Nurse Practitioner

## 2022-05-02 ENCOUNTER — Telehealth: Payer: Self-pay | Admitting: Emergency Medicine

## 2022-05-02 NOTE — Telephone Encounter (Signed)
-----   Message from Minna Merritts, MD sent at 04/29/2022  4:43 PM EDT ----- Echocardiogram Ejection fraction remains low, slightly lower than August 2022 Previously 35 to 40% now 30 to 35% This is a big drop compared to 2020 and we should consider ischemic work-up Would consider a Myoview to rule out ischemia

## 2022-05-02 NOTE — Telephone Encounter (Signed)
Called patient, went over results and recommendations. Pt verbalized understanding. Pt would like to discuss myoview with Dr. Rockey Situ before moving forward with it.   Pt scheduled for 8/22, added to waitlist.   Pt voiced appreciation for the call.

## 2022-05-18 ENCOUNTER — Other Ambulatory Visit: Payer: Self-pay | Admitting: Cardiovascular Disease

## 2022-05-18 ENCOUNTER — Telehealth: Payer: Self-pay | Admitting: Cardiovascular Disease

## 2022-05-18 NOTE — Telephone Encounter (Signed)
Contacted pt to inform pt that she will need to contact her PCP we don't fill non cardiac medications. Pt's vm full at the time.

## 2022-05-18 NOTE — Telephone Encounter (Signed)
*  STAT* If patient is at the pharmacy, call can be transferred to refill team.   1. Which medications need to be refilled? (please list name of each medication and dose if known) ondansetron (ZOFRAN ODT) 4 MG disintegrating tablet  2. Which pharmacy/location (including street and city if local pharmacy) is medication to be sent to?  [place], [place] - [address]  3. Do they need a 30 day or 90 day supply? 7 day supply  Pt called stating her refill was denied. Informed her that the medication list states that refill should come from her PCP Dr. Doy Hutching, pt states that she does not see Dr. Doy Hutching on the regular and the medication was prescribed by Dr. Rockey Situ. Pt is completely out and requesting a 7 day supply until her appt Tuesday 05/23/22

## 2022-05-18 NOTE — Telephone Encounter (Signed)
Please advise pt requesting Zofran 4 mg tablet. Please see note.

## 2022-05-19 NOTE — Telephone Encounter (Signed)
  Pt said, she can' understand why her pcp needs to refill this medication since Dr. Rockey Situ prescribed it. She would like to speak with a nurse

## 2022-05-19 NOTE — Telephone Encounter (Signed)
Spoke with the patient. Adv her that zofran was sent in as a one time courtesy since it is not a cardiac medication Dr. Rockey Situ will not be able to provide long term refills.  Adv the patient that the refill rqst would need to be sent to her pcp Dr. Doy Hutching.  Pt verbalized understanding and voiced appreciation for the call.

## 2022-05-22 NOTE — Progress Notes (Unsigned)
Cardiology Office Note  Date:  05/23/2022   ID:  Sarah, Weiss [birth year], MRN [id number]  PCP:  Idelle Crouch, MD   Chief Complaint  Patient presents with   Follow up Echo    Patient c/o shortness of breath, mid-sternum pain at times and bilateral LE edema. Medications reviewed by the patient verbally.     HPI:  [name] is an 88yo woman with  coronary artery disease,  cardiac catheterization at [facility] 08/22/2012 showing severe distal left main, ostial LAD and circumflex disease also with RCA disease  CABG x4, LIMA to LAD, SVG to Diag, SVG to OM1, SVG to PDA, EVH from bilateral thighs postsurgical stroke with right-sided deficits Chronic renal insufficiency, hydronephrosis ESRD on HD 3x a week Anemia Chronic foot swelling, particularly the feet who presents for followup today of her coronary artery disease, renal failure  Last seen on clinic June 2023 Presents today in a wheelchair  Ejection fraction remains low, slightly lower than August 2022 Previously 35 to 40% now 30 to 35%  Having some atypical chest pain, mediastinal bone hurts at times Some trouble breathing Chronic leg swelling Sores on both legs Chronic nausea, requesting zofran  Feels she is tolerating dialysis, previous issues with buttock cramping Legs are weak after dialysis, sometimes trouble walking  Seen by wound clinic today, leg wraps on both legs bilaterally Continues to have trouble with chronic leg swelling  Lab work reviewed Total cholesterol 90 LDL 33 Hemoglobin 11.5 Creatinine 4.2 BUN 36 A1c 4.9  EKG personally reviewed by myself on todays visit Normal sinus rhythm rate 76 bpm poor R wave progression to the anterior precordial leads, left axis deviation  Past medical history reviewed Cardiomyopathy ejection fraction 35 to 40% noted last echocardiogram August 2022 in the setting of sepsis  possibly covid Metoprolol tartrate was changed to metoprolol succinate  Could not  tolerate metoprolol succinate, was having side effects, she stopped the medication went back on metoprolol tartrate 12.5 twice daily  HD on Mon/wed/Fri  In hospital 05/2021 Acute pulmonary edema, requiring HD Sphingobacterium bacteremia HD access declotting D/c 05/25/2021  hospital admission June 09, 2021 Weakness, COVID infection, end-stage renal disease dialysis Treated with remdesivir Initial diagnosis September 6 Had acute metabolic encephalopathy Receiving dialysis Monday Wednesday Friday  Ejection fraction on echocardiogram May 24, 2021 was 35 to 40% Ejection fraction down from prior echo November 2021, EF 60 to 65%  In the hospital for procedure with Dr. Lucky Cowboy April 2022, Creation of AV fistula  On HD,  mon/wed/fri  In hospital 08/11/2020 infected wound to dorsal aspect of right foot  outpatient dialysis since 07/28/20 wound cultures 07/16/2020 show Pseudomonas, Enterococcus and MSSA.  staph warneri. treated with IV vancomycin and IV Zosyn.    permacath on the right side of her neck that was removed by the vascular surgeon on 08/05/2020.   new tunneled dialysis catheter was inserted via right IJ approach on 08/10/2020.    4-week course of IV vancomycin and 3-week course of IV ceftazidime to be given during dialysis in the outpatient setting. amlodipine and hydralazine were discontinued secondary to hypotension  Reports that she is concerned about a sacral decub ulcer, As she is sitting for such long periods of time for dialysis She is sedentary, relatively immobile   previously seen Dr. Lucky Cowboy, for venous insufficiency   cardiac catheterization report 08/22/2012 showed distal left main 70% disease, ostial LAD of 80%, proximal LAD 70%, distal LAD diffuse 70%, diagonal #150% followed by discrete  95% lesion, ostial circumflex 90% disease, proximal circumflex 70% disease, proximal RCA 50%, distal RCA 60% PDA branch 60%  PMH:   has a past medical history of Arthritis,  Bilateral Macular degeneration, Breast cancer ([name]) (2012), Breast mass, right, CAD S/P CABG x 4, Cancer ([place]), Complication of anesthesia, Coronary artery disease (08/22/2012), Diabetes ([place]), Diastolic dysfunction, DOE (dyspnea on exertion), ESRD (end stage renal disease) ([place]), Gastritis, GERD (gastroesophageal reflux disease), Headache(784.0), History of breast cancer, History of seasonal allergies, Hyperlipemia, Hypertension, Hypothyroidism, Neuropathy, Personal history of radiation therapy, Pneumonia, PONV (postoperative nausea and vomiting), Stroke ([place]), and Vertigo.  PSH:    Past Surgical History:  Procedure Laterality Date   A/V FISTULAGRAM Left 01/13/2021   Procedure: A/V FISTULAGRAM;  Surgeon: Algernon Huxley, MD;  Location: [facility];  Service: Cardiovascular;  Laterality: Left;   A/V SHUNT INTERVENTION Left 05/23/2021   Procedure: A/V SHUNT INTERVENTION;  Surgeon: Algernon Huxley, MD;  Location: [facility];  Service: Cardiovascular;  Laterality: Left;   ABDOMINAL HYSTERECTOMY     APPENDECTOMY     AV FISTULA PLACEMENT Left 11/04/2020   Procedure: ARTERIOVENOUS (AV) FISTULA CREATION (BRACHIOCEPHALIC);  Surgeon: Algernon Huxley, MD;  Location: [facility];  Service: Vascular;  Laterality: Left;   BACK SURGERY     BREAST BIOPSY Right    2012 positive- [facility]   BREAST LUMPECTOMY Right 2012   F/U radiation    BREAST SURGERY     CARDIAC CATHETERIZATION  2014   CAROTID PTA/STENT INTERVENTION Left 06/14/2020   Procedure: CAROTID PTA/STENT INTERVENTION;  Surgeon: Algernon Huxley, MD;  Location: [place] [facility];  Service: Cardiovascular;  Laterality: Left;   CATARACT EXTRACTION W/PHACO Right 08/14/2017   Procedure: CATARACT EXTRACTION PHACO AND INTRAOCULAR LENS PLACEMENT (IOC);  Surgeon: Birder Robson, MD;  Location: [facility];  Service: Ophthalmology;  Laterality: Right;  [place] 00:43.0 AP% 17.1 CDE 7.37 Fluid Pack lot # [id number]   CATARACT EXTRACTION W/PHACO Left 10/09/2017    Procedure: CATARACT EXTRACTION PHACO AND INTRAOCULAR LENS PLACEMENT (IOC);  Surgeon: Birder Robson, MD;  Location: [facility];  Service: Ophthalmology;  Laterality: Left;  [place] 00:30.3 AP% 11.0 CDE 3.33 Fluid Pack Lot # [id number]   CORONARY ARTERY BYPASS GRAFT  09/05/2012   Procedure: CORONARY ARTERY BYPASS GRAFTING (CABG);  Surgeon: Rexene Alberts, MD;  Location: [facility];  Service: Open Heart Surgery;  Laterality: N/A;  CABG x four, using left internal mammary artery and bilateral greater saphenous vein harvested endoscopically   DIALYSIS/PERMA CATHETER INSERTION Left 06/25/2020   Procedure: DIALYSIS/PERMA CATHETER INSERTION;  Surgeon: Algernon Huxley, MD;  Location: [facility];  Service: Cardiovascular;  Laterality: Left;   DIALYSIS/PERMA CATHETER INSERTION N/A 08/10/2020   Procedure: DIALYSIS/PERMA CATHETER INSERTION;  Surgeon: Katha Cabal, MD;  Location: [facility];  Service: Cardiovascular;  Laterality: N/A;   DIALYSIS/PERMA CATHETER INSERTION N/A 05/23/2021   Procedure: DIALYSIS/PERMA CATHETER INSERTION;  Surgeon: Algernon Huxley, MD;  Location: [facility];  Service: Cardiovascular;  Laterality: N/A;   DIALYSIS/PERMA CATHETER REMOVAL N/A 08/05/2020   Procedure: DIALYSIS/PERMA CATHETER REMOVAL;  Surgeon: Algernon Huxley, MD;  Location: [facility];  Service: Cardiovascular;  Laterality: N/A;   INTRAOPERATIVE TRANSESOPHAGEAL ECHOCARDIOGRAM  09/05/2012   Procedure: INTRAOPERATIVE TRANSESOPHAGEAL ECHOCARDIOGRAM;  Surgeon: Rexene Alberts, MD;  Location: [facility];  Service: Open Heart Surgery;  Laterality: N/A;   KNEE SURGERY     Bilateral nerve block   LAPAROSCOPIC NISSEN FUNDOPLICATION  OVARIAN CYST REMOVAL      Current Outpatient Medications  Medication Sig Dispense Refill   acetaminophen (TYLENOL) 325 MG tablet Take 2 tablets (650 mg total) by mouth every 6 (six) hours as needed for headache.     ALPRAZolam (XANAX) 0.5 MG tablet Take 1 tablet (0.5 mg total)  by mouth 2 (two) times daily as needed for anxiety. 10 tablet 0   calcium-vitamin D (OSCAL WITH D) 500-200 MG-UNIT tablet Take 1 tablet by mouth daily.     clopidogrel (PLAVIX) 75 MG tablet Take 75 mg by mouth daily.     Cyanocobalamin 1000 MCG SUBL Take 1,000 mcg by mouth daily.      famotidine (PEPCID) 10 MG tablet Take 1 tablet (10 mg total) by mouth daily.     gabapentin (NEURONTIN) 100 MG capsule Take 1 capsule by mouth 2 (two) times daily.     HYDROcodone-acetaminophen (NORCO/VICODIN) 5-325 MG tablet Take 1 tablet by mouth every 6 (six) hours as needed for moderate pain. 10 tablet 0   levothyroxine (SYNTHROID) 150 MCG tablet Take 150 mcg by mouth daily before breakfast.     metoprolol tartrate (LOPRESSOR) 25 MG tablet Take 0.5 tablets (12.5 mg total) by mouth 2 (two) times daily. 90 tablet 3   ondansetron (ZOFRAN ODT) 4 MG disintegrating tablet Take 1 tablet (4 mg total) by mouth every 8 (eight) hours as needed for nausea or vomiting. 20 tablet 0   OXYGEN Inhale 2 L into the lungs daily.     torsemide (DEMADEX) 100 MG tablet Take 100 mg by mouth 3 (three) times a week. Take 1 tablet three times a week on non-dialysis days.     No current facility-administered medications for this visit.    Allergies:   Clonidine, Darvocet [propoxyphene n-acetaminophen], Hydralazine, Hydralazine hcl, Metoprolol, Esomeprazole, Guaifenesin, Phenylephrine-guaifenesin, Rabeprazole, Telithromycin, Fluocinolone, Iodinated contrast media, Levaquin [levofloxacin], and Statins   Social History:  The patient  reports that she quit smoking about 33 years ago. Her smoking use included cigarettes. She has never used smokeless tobacco. She reports that she does not drink alcohol and does not use drugs.   Family History:   family history includes Breast cancer in her cousin; Cancer in her father; Heart disease in her brother and mother; Hyperlipidemia in her brother; Hypertension in her brother.    Review of  Systems: Review of Systems  Constitutional: Negative.   HENT: Negative.    Eyes: Negative.   Respiratory: Negative.    Cardiovascular:  Positive for leg swelling.  Gastrointestinal: Negative.   Musculoskeletal:  Positive for joint pain.       Gait instability  Neurological: Negative.   Psychiatric/Behavioral: Negative.    All other systems reviewed and are negative.   PHYSICAL EXAM: VS:  BP (!) 142/62 (BP Location: Right Arm, Patient Position: Sitting, Cuff Size: Normal)   Pulse 76   Ht '5\' 4"'$  (1.626 m)   Wt 167 lb (75.8 kg)   SpO2 97% Comment: 2 Liters of oxygen  BMI 28.67 kg/m  , BMI Body mass index is 28.67 kg/m. Constitutional:  oriented to person, place, and time. No distress.  HENT:  Head: Grossly normal Eyes:  no discharge. No scleral icterus.  Neck: No JVD, no carotid bruits  Cardiovascular: Regular rate and rhythm, no murmurs appreciated 1+ lower extremity edema bilaterally, legs wrapped bilaterally Pulmonary/Chest: Clear to auscultation bilaterally, no wheezes or rails Abdominal: Soft.  no distension.  no tenderness.  Musculoskeletal: Normal range of motion Neurological:  normal  muscle tone. Coordination normal. No atrophy Skin: Skin warm and dry Psychiatric: normal affect, pleasant   Recent Labs: 06/10/2021: Magnesium 2.0 06/14/2021: ALT 5 06/17/2021: BUN 53; Creatinine, Ser 6.11; Hemoglobin 10.1; Platelets 210; Potassium 4.0; Sodium 130    Lipid Panel Lab Results  Component Value Date   CHOL 154 09/09/2012   HDL 31 (L) 09/09/2012   LDLCALC 92 09/09/2012   TRIG 157 (H) 09/09/2012     Wt Readings from Last 3 Encounters:  05/23/22 167 lb (75.8 kg)  03/21/22 169 lb (76.7 kg)  01/03/22 160 lb (72.6 kg)     ASSESSMENT AND PLAN:  Cardiomyopathy Noted on last echocardiogram in the setting of sepsis August 2022 Repeat echo with ejection fraction 30 to 35% Did not tolerate metoprolol succinate, back on metoprolol tartrate and torsemide Numerous  medication intolerances options limited secondary to renal dysfunction/end-stage renal disease and some medication reluctance -Could consider low-dose nitrates, hydralazine -We have ordered pharmacologic Myoview to rule out underlying ischemia We did touch on performing cardiac catheterization, husband present for discussion  Chronic nausea Previously managed with Zofran, at her request refill provided  Essential hypertension -  Somewhat labile in the setting of dialysis Running low following a treatment  Coronary artery disease involving coronary bypass graft of native heart with angina pectoris (HCC) - Shortness of breath, some atypical chest pain symptoms in the setting of cardiomyopathy Myoview as above  Pulmonary edema On torsemide 3 times a week On HD 3 times a week Leg swelling likely secondary to lymphedema, has wraps in place  Mixed hyperlipidemia  declined cholesterol medication Numbers at goal without medication  S/P CABG x 4  Plan as above  End-stage renal disease on hemodialysis Monday Wednesday Friday, Also on torsemide  Anemia, unspecified type  Chronic anemia, managed by nephrology  Weakness Had nerve blocks to her knees bilaterally  Sedentary, weak legs, high fall risk   Total encounter time more than 30 minutes  Greater than 50% was spent in counseling and coordination of care with the patient     Orders Placed This Encounter  Procedures   EKG 12-Lead      Signed, Esmond Plants, M.D., Ph.D. 05/23/2022  [place], [place] [phone]

## 2022-05-23 ENCOUNTER — Encounter: Payer: Self-pay | Admitting: Cardiovascular Disease

## 2022-05-23 ENCOUNTER — Encounter: Payer: Medicare Other | Attending: Physician Assistant | Admitting: Physician Assistant

## 2022-05-23 ENCOUNTER — Ambulatory Visit (INDEPENDENT_AMBULATORY_CARE_PROVIDER_SITE_OTHER): Payer: Medicare Other | Admitting: Cardiovascular Disease

## 2022-05-23 VITALS — BP 142/62 | HR 76 | Ht 64.0 in | Wt 167.0 lb

## 2022-05-23 DIAGNOSIS — Z992 Dependence on renal dialysis: Secondary | ICD-10-CM | POA: Insufficient documentation

## 2022-05-23 DIAGNOSIS — I429 Cardiomyopathy, unspecified: Secondary | ICD-10-CM

## 2022-05-23 DIAGNOSIS — I25810 Atherosclerosis of coronary artery bypass graft(s) without angina pectoris: Secondary | ICD-10-CM

## 2022-05-23 DIAGNOSIS — E11621 Type 2 diabetes mellitus with foot ulcer: Secondary | ICD-10-CM | POA: Insufficient documentation

## 2022-05-23 DIAGNOSIS — I10 Essential (primary) hypertension: Secondary | ICD-10-CM

## 2022-05-23 DIAGNOSIS — E1122 Type 2 diabetes mellitus with diabetic chronic kidney disease: Secondary | ICD-10-CM | POA: Insufficient documentation

## 2022-05-23 DIAGNOSIS — N186 End stage renal disease: Secondary | ICD-10-CM | POA: Insufficient documentation

## 2022-05-23 DIAGNOSIS — Z8673 Personal history of transient ischemic attack (TIA), and cerebral infarction without residual deficits: Secondary | ICD-10-CM | POA: Diagnosis not present

## 2022-05-23 DIAGNOSIS — I2510 Atherosclerotic heart disease of native coronary artery without angina pectoris: Secondary | ICD-10-CM | POA: Diagnosis not present

## 2022-05-23 DIAGNOSIS — I6529 Occlusion and stenosis of unspecified carotid artery: Secondary | ICD-10-CM | POA: Insufficient documentation

## 2022-05-23 DIAGNOSIS — E119 Type 2 diabetes mellitus without complications: Secondary | ICD-10-CM

## 2022-05-23 DIAGNOSIS — I5040 Unspecified combined systolic (congestive) and diastolic (congestive) heart failure: Secondary | ICD-10-CM | POA: Diagnosis not present

## 2022-05-23 DIAGNOSIS — Z951 Presence of aortocoronary bypass graft: Secondary | ICD-10-CM

## 2022-05-23 DIAGNOSIS — I132 Hypertensive heart and chronic kidney disease with heart failure and with stage 5 chronic kidney disease, or end stage renal disease: Secondary | ICD-10-CM | POA: Diagnosis not present

## 2022-05-23 DIAGNOSIS — J449 Chronic obstructive pulmonary disease, unspecified: Secondary | ICD-10-CM | POA: Insufficient documentation

## 2022-05-23 DIAGNOSIS — I639 Cerebral infarction, unspecified: Secondary | ICD-10-CM

## 2022-05-23 DIAGNOSIS — R0602 Shortness of breath: Secondary | ICD-10-CM

## 2022-05-23 DIAGNOSIS — E1151 Type 2 diabetes mellitus with diabetic peripheral angiopathy without gangrene: Secondary | ICD-10-CM | POA: Diagnosis present

## 2022-05-23 DIAGNOSIS — E785 Hyperlipidemia, unspecified: Secondary | ICD-10-CM

## 2022-05-23 MED ORDER — ONDANSETRON 4 MG PO TBDP: 4.0000 mg | ORAL_TABLET | Freq: Three times a day (TID) | ORAL | 2 refills | Status: AC | PRN

## 2022-05-23 NOTE — Patient Instructions (Addendum)
Medication Instructions:  No changes  If you need a refill on your cardiac medications before your next appointment, please call your pharmacy.    Lab work: No new labs needed   Testing/Procedures:  1) Lexiscan Myoview: - Your physician has requested that you have a lexiscan myoview. Myoview for CAD, cardiomyopathy on Tuesday or Thursday  [place]  Your caregiver has ordered a Stress Test with nuclear imaging. The purpose of this test is to evaluate the blood supply to your heart muscle. This procedure is referred to as a "Non-Invasive Stress Test." This is because other than having an IV started in your vein, nothing is inserted or "invades" your body. Cardiac stress tests are done to find areas of poor blood flow to the heart by determining the extent of coronary artery disease (CAD). Some patients exercise on a treadmill, which naturally increases the blood flow to your heart, while others who are  unable to walk on a treadmill due to physical limitations have a pharmacologic/chemical stress agent called Lexiscan . This medicine will mimic walking on a treadmill by temporarily increasing your coronary blood flow.   Please note: these test may take anywhere between 2-4 hours to complete  PLEASE REPORT TO [facility] AT THE FIRST DESK WILL DIRECT YOU WHERE TO GO  Date of Procedure:_____________________________________  Arrival Time for Procedure:______________________________  Instructions regarding medication:   __xx__ : Hold Torsemide the morning of your procedure  _xx___:  Dennis Bast may take all of your other regular morning medications the day so your test with enough water to get them down safely  PLEASE NOTIFY THE OFFICE AT LEAST 24 HOURS IN ADVANCE IF YOU ARE UNABLE TO [place].  [phone] AND  PLEASE NOTIFY NUCLEAR MEDICINE AT [facility] AT LEAST 24 HOURS IN ADVANCE IF YOU ARE UNABLE TO KEEP YOUR APPOINTMENT. [phone]  How to  prepare for your Myoview test:  Do not eat or drink after midnight No caffeine for 24 hours prior to test No smoking 24 hours prior to test. Your medication may be taken with water.  If your doctor stopped a medication because of this test, do not take that medication. Ladies, please do not wear dresses.  Skirts or pants are appropriate. Please wear a short sleeve shirt. No perfume, cologne or lotion. Wear comfortable walking shoes. No heels!    Follow-Up: At [facility], you and your health needs are our priority.  As part of our continuing mission to provide you with exceptional heart care, we have created designated Provider Care Teams.  These Care Teams include your primary Cardiologist (physician) and Advanced Practice Providers (APPs -  Physician Assistants and Nurse Practitioners) who all work together to provide you with the care you need, when you need it.  You will need a follow up appointment in 6 months  Providers on your designated Care Team:   Murray Hodgkins, NP Christell Faith, PA-C Cadence Kathlen Mody, [place]  COVID-19 Vaccine Information can be found at: [email or web address] For questions related to vaccine distribution or appointments, please email [email or web address] or call [phone].

## 2022-05-25 NOTE — Progress Notes (Signed)
KALIA, [name] ([id number]) Visit Report for 05/23/2022 Chief Complaint Document Details [name], [name] Date of Service: 05/23/2022 8:45 AM Medical Record Number: [id number] Patient Account Number: [id number] Date of Birth/Sex: 07-30-1934 (88 y.o. F) Treating RN: Carlene Coria Primary Care Provider: Fulton Reek Other Clinician: Referring Provider: Self, Referral Treating Provider/Extender: Skipper Cliche in Treatment: 0 Information Obtained from: Patient Chief Complaint Bilateral LE Ulcers Electronic Signature(s) Signed: 05/23/2022 9:27:07 AM By: Worthy Keeler PA-C Entered By: Worthy Keeler on 05/23/2022 09:27:07 [name] ([id number]) -------------------------------------------------------------------------------- HPI Details [name] Date of Service: 05/23/2022 8:45 AM Medical Record Number: [id number] Patient Account Number: [id number] Date of Birth/Sex: 1934-07-06 (87 y.o. F) Treating RN: Carlene Coria Primary Care Provider: Fulton Reek Other Clinician: Referring Provider: Self, Referral Treating Provider/Extender: Skipper Cliche in Treatment: 0 History of Present Illness HPI Description: ADMISSION 07/14/2020. This is a medically complex 88 year old woman who is sent over urgently from her primary care doctor Dr. Doy Hutching at [facility] and we worked her in this afternoon. She is apparently developed a blister on the dorsal foot sometime over the last 2 weeks on the right which seems to have developed a hemorrhagic component to this. The history here is somewhat vague but this is happened since she left the hospital on 07/02/2020. She also has a blistered area on the left dorsal foot although this does not have a hemorrhagic component to it and it is not threatened to open where is the right side is definitely leaking fluid and could easily break down. I do not believe she has been doing anything specific to  this. With regards to her medical history I have reviewed the discharge summary from the hospital from 06/22/2020 through 07/02/2020. 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 She was given prednisone but was not felt to be a candidate for immunosuppression secondary to her age and comorbidities. She is ANA positive p-ANCA positive. She had a right IJ catheter placed on 9/24 and is being done on dialysis. She is a diabetic. She takes prednisone 20 mg twice daily. She was on aspirin and Plavix although I think these have been put on hold. Past medical history includes; pauci-immune crescentic glomerulonephritis. History of peripheral edema and type 2 diabetes left carotid artery stenosis with a history of a CVA, coronary artery disease and hypertension. She states that she tolerates dialysis reasonably poorly secondary to "butt cramps". She says that when she gets discomfort they stop her dialysis. This probably has something to do with her fluid overloaded state I cannot see any arterial evaluation in her record in [facility] link. We could not do arterial ABIs on her because of complaints of discomfort 10/20; 1 week follow-up. This is a patient who is a diabetic. She recently developed acute renal failure because of pauci-immune glomerulonephritis. Sometime during her initial hospitalization she developed a wound on her right foot tremendous swelling in both legs. She was sent over here urgently last week by her primary care physician with what looks to be hematoma still contained on the right dorsal foot. We put silver alginate on this and put her in compression to control some of the swelling. The left leg had a similar appearance with severe edema in the left dorsal foot so we put that under compression as well. When she arrived back for nurse follow-up on Friday there was some drainage coming out of the wound  which our nurse cultured. This is come back showing Pseudomonas Enterococcus faecalis and staph aureus although the identification of the staph aureus is not complete. I empirically put her on cefdinir 300 mg after dialysis on Tuesday Thursday and Saturday she is only had one dose. She has not been systemically unwell 10/27 1 week follow-up. With considerable help of her nursing staff I was finally able to speak to her nephrologist who manages her at dialysis. Started her on vancomycin and ceftazidime. This to cover the combination of methicillin sensitive staph aureus Enterococcus faecalis and Pseudomonas. Although the staph aureus was methicillin sensitive and the Enterococcus ampicillin sensitive vancomycin provided the best alternative here that can be given at dialysis and ceftazidime to cover the Pseudomonas. She had a traumatic wound on the top of her foot and a complicated abscess. She has significant tissue destruction from this although the wound is looking a lot better today. We are using silver alginate to the surface. X-ray of the foot did not show any deep obvious infection or osteomyelitis 12/1; since the patient was last here she was hospitalized from 11/3 through 11/10 with coag negative staphylococcal bacteremia. I do not know the the exact cause of this was determined. She was seen by vascular and they replaced her permacath on the right side of her neck. Her repeat blood cultures were negative. She is still on IV vancomycin ando Ceftazidime at dialysis. She generally feels systemically well she does not have a fever she is still receiving dialysis but reports she voids up to 8 times a day She has home health changing the dressing I believe they are using silver alginate under 3 layer compression. In addition to the large wound on her right anterior foot that we initially saw her for she has apparently a right lateral leg wound that she suffered during a fall and 2 skin tears on the  left anterior lower leg 12/8; patient comes in looking a lot better this week. One of the 2 skin tears she has on the left leg is healed the other is smaller. The area on the right lateral leg looks improved and her original wound on the right dorsal foot is a lot smaller and looks a lot better. We used Hydrofera Blue to all wound areas under 3 layer compression.. Apparently home health had some trouble with the compression wraps. 12/15 patient comes in with all of her wounds smaller including her original deep punched out wound on the right dorsal foot as well as the skin tears on the right posterior lateral calf and the left anterior lower leg. We have been using Hydrofera Blue under compression excellent results 10/12/2020 upon evaluation today patient appears to be doing decently well currently in regard to her foot ulcer. She does have a small hole still open with some tunneling that really goes proximally in the foot at around the 12:00 location. There is really not significant undermining other locations. This appears to be a small area of where there was a little fluid trapped. Nonetheless I do feel like this is still doing quite well and in general I think that she is going to benefit from possibly packing into this area with endoform in order to keep the area open while it granulates in. 11/09/2020 upon evaluation today patient appears to be doing really about the same in regard to her wound. There is a little reopening towards the distal portion of the wound bed but again I think this is just more  as a result of the dressing possibly getting stuck even to little bit of the scar tissue nonetheless it also anything looks worse although I am not sure the endoform is really doing the job for her. I think we may want to try something a little different here I think packing with a silver alginate dressing may be optimal as compared to what we tried at this point. Is been almost a month since have  seen her and we really have not seen dramatic improvement. 12/07/2020 upon evaluation today patient appears to be doing well with regard to the dorsal surface of her foot. Overall I feel like this looks to be completely healed today and this is excellent news. There is no evidence of active infection which is great news. No fevers, chills, nausea, vomiting, or diarrhea. LEQUISHA, CAMMACK ([id number]) READMISSION 01/26/2022 This is an 88 year old woman that we had in clinic October 21 through March 22 with bilateral lower extremity wounds on the dorsal feet at that time she had pauci-immune glomerulonephritis but was not yet on dialysis. We managed to heal her out. As I remember she had severe bilateral lower extremity edema secondary to acute on chronic renal failure. She arrives in clinic this time with a wound on her distal left dorsal foot that is been there for about 6 months. Completely eschar covered she has been treating this with Neosporin. She said she is on IV vancomycin and cefepime at dialysis although we have no independent verification of this. This is due to be finished on 01/30/2022. She says it was for this foot but she does not say that she has had x-rays and MRI etc. Past medical history is extensive; she has type 2 diabetes although not currently in any treatment, end-stage renal disease on dialysis Monday Wednesday and Friday, coronary artery disease status post CABG, combined systolic and diastolic heart failure, peripheral vascular disease, cerebrovascular disease. Carotid artery stenosis, COPD, breast cancer ABI in our clinic was 0.79. I cannot find that she has had previous noninvasive arterial studies. She is only able to walk around the house does not describe claudication that I can elicit Upon inspection patient's wounds actually appear to be doing decently well. On the left foot this is mainly dry drainage, bleeding, and Iodoflex which is stuck on the surface of the wound  I was able to get that off mechanically she did not want any sharp debridement whatsoever fortunately is able to get it off mechanically without using that just using saline and gauze. Subsequently I think this is otherwise doing quite well. On the right leg she does have swelling and she had a blister that popped up this feels like it almost completely sealed down but it still weeping and leaking a little bit I think we need to manage that as well to be honest. 02-14-2022 upon evaluation today patient appears to be doing well currently in regard to the wound on her foot. She has been tolerating the dressing changes there is a little bit of film buildup here in the patient last week was very resistant to debridement because her ABIs were somewhat low I was avoiding that as well. However she did see Dr. Lazaro Arms and he apparently told her that her ABIs were good everything appeared to be fine and to be honest I think this is excellent news. I am very pleased to hear this and I do believe doing some sharp debridement would be helpful at this point as well. 02-21-2022 upon  evaluation today patient appears to be doing well with regard to her wounds. Both are showing signs of improvement this is great news. Fortunately I do not see any evidence of active infection locally or systemically at this time which is great news. No fevers, chills, nausea, vomiting, or diarrhea. 03-02-2022 upon evaluation today patient appears to be doing better in regard to her right leg which appears to be healed. In regards to her left leg this on the foot at the top still has a small opening at this time. Fortunately there does not appear to be any signs of infection. With that being said I do think we may want to switch over to collagen based dressing to see if we get this to feeling a little bit better. 03-09-2022 upon evaluation today patient appears to be doing well currently in regard to her wound on the top of her left foot. I am  very pleased with where things stand and I think we are headed in the right direction here. She is still having a lot of trouble with the compression wrap. She is doing extremely well on the right side with the Tubigrip so I think we can probably switch over to using the Tubigrip on the left side as well especially since the collagen is getting so dry I do not believe this is doing her as much good as I would like for her to to be honest. 03-23-2022 upon evaluation today patient appears to be doing well with regard to her foot ulcer. This is pretty much healed at this point. Fortunately I see no signs of infection which is great news. The biggest issue I think is good to be keeping her edema under control. The Tubigrip is not very tight but unfortunately rolls down the cuts and on the top of her leg I think she needs something to try to help keep this under better control more evenly she has done well she tells me in the past with an Ace wrap I think we can probably give that a shot. Readmission: 05-23-2022 upon evaluation today patient presents for reevaluation here in the clinic she was last seen June 23, 2022. At that time we got her compression wraps with juxta lites. Unfortunately she did not wear these appropriately she tells me that they were unable to get them on. I am not really sure why that is and to be peripherally honest I think that this to some degree is just a matter of they let her legs get too swollen and out-of-control and then subsequently ended up with it getting to the point that the juxta lites will be been fit. Either way based on what I am seeing at this time I do believe that the patient has been required compression here in the office to get this under control I think the 3 layer compression wraps probably the right way to go she is not going to tolerate anything too tight and the 4-layer I think would be much too tight for her. Patient's past medical history really has  not changed she does have hypertension, end-stage renal disease, and she is on renal dialysis. Unfortunately they are not able to remove so much fluid as it throws her entire electrolyte imbalance that subsequently causes extreme cramping. Electronic Signature(s) Signed: 05/23/2022 9:39:43 AM By: Worthy Keeler PA-C Entered By: Worthy Keeler on 05/23/2022 09:39:43 [name] ([id number]) -------------------------------------------------------------------------------- Physical Exam Details [name] Date of Service: 05/23/2022 8:45  AM Medical Record Number: [id number] Patient Account Number: [id number] Date of Birth/Sex: 24-Jun-1934 (88 y.o. F) Treating RN: Carlene Coria Primary Care Provider: Fulton Reek Other Clinician: Referring Provider: Self, Referral Treating Provider/Extender: Skipper Cliche in Treatment: 0 Constitutional sitting or standing blood pressure is within target range for patient.. pulse regular and within target range for patient.Marland Kitchen respirations regular, non- labored and within target range for patient.Marland Kitchen temperature within target range for patient.. Well-nourished and well-hydrated in no acute distress. Eyes conjunctiva clear no eyelid edema noted. pupils equal round and reactive to light and accommodation. Ears, Nose, Mouth, and Throat no gross abnormality of ear auricles or external auditory canals. normal hearing noted during conversation. mucus membranes moist. Respiratory normal breathing without difficulty. Cardiovascular 1+ dorsalis pedis/posterior tibialis pulses. 3+ pitting edema of the bilateral lower extremities. Musculoskeletal normal gait and posture. no significant deformity or arthritic changes, no loss or range of motion, no clubbing. Psychiatric this patient is able to make decisions and demonstrates good insight into disease process. Alert and Oriented x 3. pleasant and cooperative. Notes Upon inspection patient's  wound bed actually showed signs of being rather deep on the right leg this is where she got gauze to worse with regard to the oxygen tank falling on her. With that being said overall I think that it appears to be without infection at this point which is good news. Her legs are very swollen however at this time with significant pitting edema. I think we are going to have to Wrap her to get this under control. Electronic Signature(s) Signed: 05/23/2022 9:52:57 AM By: Worthy Keeler PA-C Entered By: Worthy Keeler on 05/23/2022 09:52:57 [name] ([id number]) -------------------------------------------------------------------------------- Physician Orders Details [name] Date of Service: 05/23/2022 8:45 AM Medical Record Number: [id number] Patient Account Number: [id number] Date of Birth/Sex: Feb 06, 1934 (87 y.o. F) Treating RN: Carlene Coria Primary Care Provider: Fulton Reek Other Clinician: Referring Provider: Self, Referral Treating Provider/Extender: Skipper Cliche in Treatment: 0 Verbal / Phone Orders: No Diagnosis Coding ICD-10 Coding Code Description [phone] Chronic venous hypertension (idiopathic) with ulcer and inflammation of bilateral lower extremity L97.812 Non-pressure chronic ulcer of other part of right lower leg with fat layer exposed L97.822 Non-pressure chronic ulcer of other part of left lower leg with fat layer exposed I10 Essential (primary) hypertension Z99.2 Dependence on renal dialysis I12.0 Hypertensive chronic kidney disease with stage 5 chronic kidney disease or end stage renal disease Follow-up Appointments o Return Appointment in 1 week. Bathing/ Shower/ Hygiene o May shower with wound dressing protected with water repellent cover or cast protector. Anesthetic (Use 'Patient Medications' Section for Anesthetic Order Entry) o Lidocaine applied to wound bed Edema Control - Lymphedema / Segmental Compressive Device /  Other o Optional: One layer of unna paste to top of compression wrap (to act as an anchor). o Elevate, Exercise Daily and Avoid Standing for Long Periods of Time. o Elevate legs to the level of the heart and pump ankles as often as possible o Elevate leg(s) parallel to the floor when sitting. Wound Treatment Wound #7 - Lower Leg Wound Laterality: Right, Medial Cleanser: Wound Cleanser 1 x Per Week/30 Days Discharge Instructions: Wash your hands with soap and water. Remove old dressing, discard into plastic bag and place into trash. Cleanse the wound with Wound Cleanser prior to applying a clean dressing using gauze sponges, not tissues or cotton balls. Do not scrub or use excessive force. Pat dry using gauze sponges, not tissue or  cotton balls. Primary Dressing: Silvercel 4 1/4x 4 1/4 (in/in) 1 x Per Week/30 Days Discharge Instructions: Apply Silvercel 4 1/4x 4 1/4 (in/in) as instructed Secondary Dressing: ABD Pad 5x9 (in/in) 1 x Per Week/30 Days Discharge Instructions: Cover with ABD pad Secondary Dressing: Gauze 1 x Per Week/30 Days Discharge Instructions: As directed: dry, moistened with saline or moistened with Dakins Solution Compression Wrap: 3-LAYER WRAP - Profore Lite LF 3 Multilayer Compression Bandaging System 1 x Per Week/30 Days Discharge Instructions: Apply 3 multi-layer wrap as prescribed. Wound #8 - Lower Leg Wound Laterality: Left, Lateral Cleanser: Wound Cleanser 1 x Per Week/30 Days Discharge Instructions: Wash your hands with soap and water. Remove old dressing, discard into plastic bag and place into trash. Cleanse the wound with Wound Cleanser prior to applying a clean dressing using gauze sponges, not tissues or cotton balls. Do not scrub or use excessive force. Pat dry using gauze sponges, not tissue or cotton balls. Primary Dressing: Silvercel 4 1/4x 4 1/4 (in/in) 1 x Per Week/30 Days Discharge Instructions: Apply Silvercel 4 1/4x 4 1/4 (in/in) as  instructed Secondary Dressing: ABD Pad 5x9 (in/in) 1 x Per Week/30 Days [name], [name] ([id number]) Discharge Instructions: Cover with ABD pad Secondary Dressing: Gauze 1 x Per Week/30 Days Discharge Instructions: As directed: dry, moistened with saline or moistened with Dakins Solution Compression Wrap: 3-LAYER WRAP - Profore Lite LF 3 Multilayer Compression Bandaging System 1 x Per Week/30 Days Discharge Instructions: Apply 3 multi-layer wrap as prescribed. Electronic Signature(s) Signed: 05/23/2022 6:22:21 PM By: Worthy Keeler PA-C Signed: 05/25/2022 4:19:23 PM By: Carlene Coria RN Entered By: Carlene Coria on 05/23/2022 09:33:43 [name] ([id number]) -------------------------------------------------------------------------------- Problem List Details [name] Date of Service: 05/23/2022 8:45 AM Medical Record Number: [id number] Patient Account Number: [id number] Date of Birth/Sex: 1934/08/23 (87 y.o. F) Treating RN: Carlene Coria Primary Care Provider: Fulton Reek Other Clinician: Referring Provider: Self, Referral Treating Provider/Extender: Skipper Cliche in Treatment: 0 Active Problems ICD-10 Encounter Code Description Active Date MDM Diagnosis I87.333 Chronic venous hypertension (idiopathic) with ulcer and inflammation of 05/23/2022 No Yes bilateral lower extremity L97.812 Non-pressure chronic ulcer of other part of right lower leg with fat layer 05/23/2022 No Yes exposed L97.822 Non-pressure chronic ulcer of other part of left lower leg with fat layer 05/23/2022 No Yes exposed [place] (primary) hypertension 05/23/2022 No Yes Z99.2 Dependence on renal dialysis 05/23/2022 No Yes I12.0 Hypertensive chronic kidney disease with stage 5 chronic kidney disease 05/23/2022 No Yes or end stage renal disease Inactive Problems Resolved Problems Electronic Signature(s) Signed: 05/23/2022 9:26:45 AM By: Worthy Keeler PA-C Entered By:  Worthy Keeler on 05/23/2022 09:26:45 [name] ([id number]) -------------------------------------------------------------------------------- Progress Note Details [name] Date of Service: 05/23/2022 8:45 AM Medical Record Number: [id number] Patient Account Number: [id number] Date of Birth/Sex: 11-19-1933 (87 y.o. F) Treating RN: Carlene Coria Primary Care Provider: Fulton Reek Other Clinician: Referring Provider: Self, Referral Treating Provider/Extender: Skipper Cliche in Treatment: 0 Subjective Chief Complaint Information obtained from Patient Bilateral LE Ulcers History of Present Illness (HPI) ADMISSION 07/14/2020. This is a medically complex 88 year old woman who is sent over urgently from her primary care doctor Dr. Doy Hutching at [facility] and we worked her in this afternoon. She is apparently developed a blister on the dorsal foot sometime over the last 2 weeks on the right which seems to have developed a hemorrhagic component to this. The history here is somewhat vague but this  is happened since she left the hospital on 07/02/2020. She also has a blistered area on the left dorsal foot although this does not have a hemorrhagic component to it and it is not threatened to open where is the right side is definitely leaking fluid and could easily break down. I do not believe she has been doing anything specific to this. With regards to her medical history I have reviewed the discharge summary from the hospital from 06/22/2020 through 07/02/2020. 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 She was given prednisone but was not felt to be a candidate for immunosuppression secondary to her age and comorbidities. She is ANA positive p-ANCA positive. She had a right IJ catheter placed on 9/24 and is being done on dialysis. She is a diabetic. She takes prednisone 20  mg twice daily. She was on aspirin and Plavix although I think these have been put on hold. Past medical history includes; pauci-immune crescentic glomerulonephritis. History of peripheral edema and type 2 diabetes left carotid artery stenosis with a history of a CVA, coronary artery disease and hypertension. She states that she tolerates dialysis reasonably poorly secondary to "butt cramps". She says that when she gets discomfort they stop her dialysis. This probably has something to do with her fluid overloaded state I cannot see any arterial evaluation in her record in [facility] link. We could not do arterial ABIs on her because of complaints of discomfort 10/20; 1 week follow-up. This is a patient who is a diabetic. She recently developed acute renal failure because of pauci-immune glomerulonephritis. Sometime during her initial hospitalization she developed a wound on her right foot tremendous swelling in both legs. She was sent over here urgently last week by her primary care physician with what looks to be hematoma still contained on the right dorsal foot. We put silver alginate on this and put her in compression to control some of the swelling. The left leg had a similar appearance with severe edema in the left dorsal foot so we put that under compression as well. When she arrived back for nurse follow-up on Friday there was some drainage coming out of the wound which our nurse cultured. This is come back showing Pseudomonas Enterococcus faecalis and staph aureus although the identification of the staph aureus is not complete. I empirically put her on cefdinir 300 mg after dialysis on Tuesday Thursday and Saturday she is only had one dose. She has not been systemically unwell 10/27 1 week follow-up. With considerable help of her nursing staff I was finally able to speak to her nephrologist who manages her at dialysis. Started her on vancomycin and ceftazidime. This to cover the combination  of methicillin sensitive staph aureus Enterococcus faecalis and Pseudomonas. Although the staph aureus was methicillin sensitive and the Enterococcus ampicillin sensitive vancomycin provided the best alternative here that can be given at dialysis and ceftazidime to cover the Pseudomonas. She had a traumatic wound on the top of her foot and a complicated abscess. She has significant tissue destruction from this although the wound is looking a lot better today. We are using silver alginate to the surface. X-ray of the foot did not show any deep obvious infection or osteomyelitis 12/1; since the patient was last here she was hospitalized from 11/3 through 11/10 with coag negative staphylococcal bacteremia. I do not know the the exact cause of this was determined. She was seen by vascular and they replaced  her permacath on the right side of her neck. Her repeat blood cultures were negative. She is still on IV vancomycin ando Ceftazidime at dialysis. She generally feels systemically well she does not have a fever she is still receiving dialysis but reports she voids up to 8 times a day She has home health changing the dressing I believe they are using silver alginate under 3 layer compression. In addition to the large wound on her right anterior foot that we initially saw her for she has apparently a right lateral leg wound that she suffered during a fall and 2 skin tears on the left anterior lower leg 12/8; patient comes in looking a lot better this week. One of the 2 skin tears she has on the left leg is healed the other is smaller. The area on the right lateral leg looks improved and her original wound on the right dorsal foot is a lot smaller and looks a lot better. We used Hydrofera Blue to all wound areas under 3 layer compression.. Apparently home health had some trouble with the compression wraps. 12/15 patient comes in with all of her wounds smaller including her original deep punched out wound on  the right dorsal foot as well as the skin tears on the right posterior lateral calf and the left anterior lower leg. We have been using Hydrofera Blue under compression excellent results 10/12/2020 upon evaluation today patient appears to be doing decently well currently in regard to her foot ulcer. She does have a small hole still open with some tunneling that really goes proximally in the foot at around the 12:00 location. There is really not significant undermining other locations. This appears to be a small area of where there was a little fluid trapped. Nonetheless I do feel like this is still doing quite well and in general I think that she is going to benefit from possibly packing into this area with endoform in order to keep the area open while it granulates in. 11/09/2020 upon evaluation today patient appears to be doing really about the same in regard to her wound. There is a little reopening towards the distal portion of the wound bed but again I think this is just more as a result of the dressing possibly getting stuck even to little bit of the scar tissue nonetheless it also anything looks worse although I am not sure the endoform is really doing the job for her. I think we may want to try something a little different here I think packing with a silver alginate dressing may be optimal as compared to what we tried at this point. Is been [name], Sarah T. ([id number]) almost a month since have seen her and we really have not seen dramatic improvement. 12/07/2020 upon evaluation today patient appears to be doing well with regard to the dorsal surface of her foot. Overall I feel like this looks to be completely healed today and this is excellent news. There is no evidence of active infection which is great news. No fevers, chills, nausea, vomiting, or diarrhea. 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 We managed to heal her out. As I remember she had severe bilateral lower extremity edema secondary to acute on chronic renal failure. She arrives in clinic this time with a wound on her distal  left dorsal foot that is been there for about 6 months. Completely eschar covered she has been treating this with Neosporin. She said she is on IV vancomycin and cefepime at dialysis although we have no independent verification of this. This is due to be finished on 01/30/2022. She says it was for this foot but she does not say that she has had x-rays and MRI etc. Past medical history is extensive; she has type 2 diabetes although not currently in any treatment, end-stage renal disease on dialysis Monday Wednesday and Friday, coronary artery disease status post CABG, combined systolic and diastolic heart failure, peripheral vascular disease, cerebrovascular disease. Carotid artery stenosis, COPD, breast cancer ABI in our clinic was 0.79. I cannot find that she has had previous noninvasive arterial studies. She is only able to walk around the house does not describe claudication that I can elicit Upon inspection patient's wounds actually appear to be doing decently well. On the left foot this is mainly dry drainage, bleeding, and Iodoflex which is stuck on the surface of the wound I was able to get that off mechanically she did not want any sharp debridement whatsoever fortunately is able to get it off mechanically without using that just using saline and gauze. Subsequently I think this is otherwise doing quite well. On the right leg she does have swelling and she had a blister that popped up this feels like it almost completely sealed down but it still weeping and leaking a little bit I think we need to manage that as well to be honest. 02-14-2022 upon evaluation today patient appears to be doing well  currently in regard to the wound on her foot. She has been tolerating the dressing changes there is a little bit of film buildup here in the patient last week was very resistant to debridement because her ABIs were somewhat low I was avoiding that as well. However she did see Dr. Lazaro Arms and he apparently told her that her ABIs were good everything appeared to be fine and to be honest I think this is excellent news. I am very pleased to hear this and I do believe doing some sharp debridement would be helpful at this point as well. 02-21-2022 upon evaluation today patient appears to be doing well with regard to her wounds. Both are showing signs of improvement this is great news. Fortunately I do not see any evidence of active infection locally or systemically at this time which is great news. No fevers, chills, nausea, vomiting, or diarrhea. 03-02-2022 upon evaluation today patient appears to be doing better in regard to her right leg which appears to be healed. In regards to her left leg this on the foot at the top still has a small opening at this time. Fortunately there does not appear to be any signs of infection. With that being said I do think we may want to switch over to collagen based dressing to see if we get this to feeling a little bit better. 03-09-2022 upon evaluation today patient appears to be doing well currently in regard to her wound on the top of her left foot. I am very pleased with where things stand and I think we are headed in the right direction here. She is still having a lot of trouble with the compression wrap. She is doing extremely well on the right side with the Tubigrip so I think we can probably switch over to using the Tubigrip on the left side as well especially since  the collagen is getting so dry I do not believe this is doing her as much good as I would like for her to to be honest. 03-23-2022 upon evaluation today patient appears to be doing well with regard to her foot  ulcer. This is pretty much healed at this point. Fortunately I see no signs of infection which is great news. The biggest issue I think is good to be keeping her edema under control. The Tubigrip is not very tight but unfortunately rolls down the cuts and on the top of her leg I think she needs something to try to help keep this under better control more evenly she has done well she tells me in the past with an Ace wrap I think we can probably give that a shot. Readmission: 05-23-2022 upon evaluation today patient presents for reevaluation here in the clinic she was last seen June 23, 2022. At that time we got her compression wraps with juxta lites. Unfortunately she did not wear these appropriately she tells me that they were unable to get them on. I am not really sure why that is and to be peripherally honest I think that this to some degree is just a matter of they let her legs get too swollen and out-of-control and then subsequently ended up with it getting to the point that the juxta lites will be been fit. Either way based on what I am seeing at this time I do believe that the patient has been required compression here in the office to get this under control I think the 3 layer compression wraps probably the right way to go she is not going to tolerate anything too tight and the 4-layer I think would be much too tight for her. Patient's past medical history really has not changed she does have hypertension, end-stage renal disease, and she is on renal dialysis. Unfortunately they are not able to remove so much fluid as it throws her entire electrolyte imbalance that subsequently causes extreme cramping. Patient History Information obtained from Patient. Allergies clonidine (Reaction: sob), Darvocet-N, hydralazine, esomeprazole, guaifenesin, phenylephrine, rabeprazole, telithromycin, fluocinonide, Iodinated Contrast Media, Levaquin (Reaction: nausea), Statins-Hmg-Coa Reductase  Inhibitors Family History No family history of Cancer, Diabetes, Heart Disease, Hereditary Spherocytosis, Hypertension, Kidney Disease, Lung Disease, Seizures, Stroke, Thyroid Problems, Tuberculosis. [name], [name] ([id number]) Social History Former smoker - 33 years, Marital Status - Married, Alcohol Use - Never, Drug Use - No History, Caffeine Use - Never. Medical History Cardiovascular Patient has history of Congestive Heart Failure, Hypertension Denies history of Angina, Arrhythmia, Hypotension, Myocardial Infarction, Peripheral Arterial Disease, Peripheral Venous Disease, Phlebitis Endocrine Patient has history of Type II Diabetes Oncologic Patient has history of Received Radiation Hospitalization/Surgery History - [facility]. Objective Constitutional sitting or standing blood pressure is within target range for patient.. pulse regular and within target range for patient.Marland Kitchen respirations regular, non- labored and within target range for patient.Marland Kitchen temperature within target range for patient.. Well-nourished and well-hydrated in no acute distress. Vitals Time Taken: 8:39 AM, Height: 64 in, Source: Stated, Weight: 166 lbs, Source: Stated, BMI: 28.5, Temperature: 97.6 F, Pulse: 62 bpm, Respiratory Rate: 18 breaths/min, Blood Pressure: 136/77 mmHg. Eyes conjunctiva clear no eyelid edema noted. pupils equal round and reactive to light and accommodation. Ears, Nose, Mouth, and Throat no gross abnormality of ear auricles or external auditory canals. normal hearing noted during conversation. mucus membranes moist. Respiratory normal breathing without difficulty. Cardiovascular 1+ dorsalis pedis/posterior tibialis pulses. 3+ pitting edema of the bilateral lower extremities.  Musculoskeletal normal gait and posture. no significant deformity or arthritic changes, no loss or range of motion, no clubbing. Psychiatric this patient is able to make decisions and demonstrates good insight into  disease process. Alert and Oriented x 3. pleasant and cooperative. General Notes: Upon inspection patient's wound bed actually showed signs of being rather deep on the right leg this is where she got gauze to worse with regard to the oxygen tank falling on her. With that being said overall I think that it appears to be without infection at this point which is good news. Her legs are very swollen however at this time with significant pitting edema. I think we are going to have to Wrap her to get this under control. Integumentary (Hair, Skin) Wound #7 status is Open. Original cause of wound was Gradually Appeared. The date acquired was: 04/19/2022. The wound is located on the Right,Medial Lower Leg. The wound measures 2cm length x 6.2cm width x 0.2cm depth; 9.739cm^2 area and 1.948cm^3 volume. There is no tunneling or undermining noted. There is a medium amount of serosanguineous drainage noted. There is no granulation within the wound bed. There is a large (67-100%) amount of necrotic tissue within the wound bed including Adherent Slough. Wound #8 status is Open. Original cause of wound was Trauma. The date acquired was: 04/24/2022. The wound is located on the Left,Lateral Lower Leg. The wound measures 1.2cm length x 2.5cm width x 0.1cm depth; 2.356cm^2 area and 0.236cm^3 volume. There is Fat Layer (Subcutaneous Tissue) exposed. There is no tunneling or undermining noted. There is a medium amount of serosanguineous drainage noted. There is large (67-100%) red granulation within the wound bed. There is a small (1-33%) amount of necrotic tissue within the wound bed including Adherent Slough. Assessment Active Problems [name], [name] ([id number]) ICD-10 Chronic venous hypertension (idiopathic) with ulcer and inflammation of bilateral lower extremity Non-pressure chronic ulcer of other part of right lower leg with fat layer exposed Non-pressure chronic ulcer of other part of left lower leg with fat  layer exposed Essential (primary) hypertension Dependence on renal dialysis Hypertensive chronic kidney disease with stage 5 chronic kidney disease or end stage renal disease Plan Follow-up Appointments: Return Appointment in 1 week. Bathing/ Shower/ Hygiene: May shower with wound dressing protected with water repellent cover or cast protector. Anesthetic (Use 'Patient Medications' Section for Anesthetic Order Entry): Lidocaine applied to wound bed Edema Control - Lymphedema / Segmental Compressive Device / Other: Optional: One layer of unna paste to top of compression wrap (to act as an anchor). Elevate, Exercise Daily and Avoid Standing for Long Periods of Time. Elevate legs to the level of the heart and pump ankles as often as possible Elevate leg(s) parallel to the floor when sitting. WOUND #7: - Lower Leg Wound Laterality: Right, Medial Cleanser: Wound Cleanser 1 x Per Week/30 Days Discharge Instructions: Wash your hands with soap and water. Remove old dressing, discard into plastic bag and place into trash. Cleanse the wound with Wound Cleanser prior to applying a clean dressing using gauze sponges, not tissues or cotton balls. Do not scrub or use excessive force. Pat dry using gauze sponges, not tissue or cotton balls. Primary Dressing: Silvercel 4 1/4x 4 1/4 (in/in) 1 x Per Week/30 Days Discharge Instructions: Apply Silvercel 4 1/4x 4 1/4 (in/in) as instructed Secondary Dressing: ABD Pad 5x9 (in/in) 1 x Per Week/30 Days Discharge Instructions: Cover with ABD pad Secondary Dressing: Gauze 1 x Per Week/30 Days Discharge Instructions: As directed: dry, moistened  with saline or moistened with Dakins Solution Compression Wrap: 3-LAYER WRAP - Profore Lite LF 3 Multilayer Compression Bandaging System 1 x Per Week/30 Days Discharge Instructions: Apply 3 multi-layer wrap as prescribed. WOUND #8: - Lower Leg Wound Laterality: Left, Lateral Cleanser: Wound Cleanser 1 x Per Week/30  Days Discharge Instructions: Wash your hands with soap and water. Remove old dressing, discard into plastic bag and place into trash. Cleanse the wound with Wound Cleanser prior to applying a clean dressing using gauze sponges, not tissues or cotton balls. Do not scrub or use excessive force. Pat dry using gauze sponges, not tissue or cotton balls. Primary Dressing: Silvercel 4 1/4x 4 1/4 (in/in) 1 x Per Week/30 Days Discharge Instructions: Apply Silvercel 4 1/4x 4 1/4 (in/in) as instructed Secondary Dressing: ABD Pad 5x9 (in/in) 1 x Per Week/30 Days Discharge Instructions: Cover with ABD pad Secondary Dressing: Gauze 1 x Per Week/30 Days Discharge Instructions: As directed: dry, moistened with saline or moistened with Dakins Solution Compression Wrap: 3-LAYER WRAP - Profore Lite LF 3 Multilayer Compression Bandaging System 1 x Per Week/30 Days Discharge Instructions: Apply 3 multi-layer wrap as prescribed. 1. I am good recommend currently that we go ahead and continue with the recommendation for 3 layer compression wrap which has done well for her in the past. I think this is good to be a good option. 2. I am also can recommend that we go ahead and initiate treatment with a silver alginate dressing. 3. I am going to suggest that the patient should try to elevate her legs much as possible. 4. I am also going to suggest that she continue with dialysis understand the obviously cannot pull off any more fluid due to issues with electrolyte balance and cramping but nonetheless I think it still important make sure she gets to every appointment and she states that she is definitely doing so. We will see patient back for reevaluation in 1 week here in the clinic. If anything worsens or changes patient will contact our office for additional recommendations. Electronic Signature(s) Signed: 05/23/2022 9:54:06 AM By: Worthy Keeler PA-C Entered By: Worthy Keeler on 05/23/2022 09:54:06 [name] ([id number]) -------------------------------------------------------------------------------- ROS/PFSH Details [name] Date of Service: 05/23/2022 8:45 AM Medical Record Number: [id number] Patient Account Number: [id number] Date of Birth/Sex: [birth year] (87 y.o. F) Treating RN: Carlene Coria Primary Care Provider: Fulton Reek Other Clinician: Referring Provider: Self, Referral Treating Provider/Extender: Skipper Cliche in Treatment: 0 Information Obtained From Patient Cardiovascular Medical History: Positive for: Congestive Heart Failure; Hypertension Negative for: Angina; Arrhythmia; Hypotension; Myocardial Infarction; Peripheral Arterial Disease; Peripheral Venous Disease; Phlebitis Endocrine Medical History: Positive for: Type II Diabetes Treated with: Oral agents Blood sugar tested every day: Yes Tested : Oncologic Medical History: Positive for: Received Radiation Immunizations Pneumococcal Vaccine: Received Pneumococcal Vaccination: No Implantable Devices None Hospitalization / Surgery History Type of Hospitalization/Surgery [facility] Family and Social History Cancer: No; Diabetes: No; Heart Disease: No; Hereditary Spherocytosis: No; Hypertension: No; Kidney Disease: No; Lung Disease: No; Seizures: No; Stroke: No; Thyroid Problems: No; Tuberculosis: No; Former smoker - 31 years; Marital Status - Married; Alcohol Use: Never; Drug Use: No History; Caffeine Use: Never; Financial Concerns: No; Food, Clothing or Shelter Needs: No; Support System Lacking: No; Transportation Concerns: No Electronic Signature(s) Signed: 05/23/2022 6:22:21 PM By: Worthy Keeler PA-C Signed: 05/25/2022 4:19:23 PM By: Carlene Coria RN Entered By: Carlene Coria on 05/23/2022 08:44:03 [name] ([id number]) -------------------------------------------------------------------------------- SuperBill Details Patient Name: Sarah Labrum  Anadia T. Date of Service:  05/23/2022 Medical Record Number: [id number] Patient Account Number: [id number] Date of Birth/Sex: 11-Jan-1934 (88 y.o. F) Treating RN: Carlene Coria Primary Care Provider: Fulton Reek Other Clinician: Referring Provider: Self, Referral Treating Provider/Extender: Skipper Cliche in Treatment: 0 Diagnosis Coding ICD-10 Codes Code Description [phone] Chronic venous hypertension (idiopathic) with ulcer and inflammation of bilateral lower extremity L97.812 Non-pressure chronic ulcer of other part of right lower leg with fat layer exposed L97.822 Non-pressure chronic ulcer of other part of left lower leg with fat layer exposed I10 Essential (primary) hypertension Z99.2 Dependence on renal dialysis I12.0 Hypertensive chronic kidney disease with stage 5 chronic kidney disease or end stage renal disease Facility Procedures CPT4: Description Modifier Quantity Code [id number] BILATERAL: Application of multi-layer venous compression system; leg (below knee), including 1 ankle and foot. Physician Procedures CPT4 Code Description: [id number] - WC PHYS LEVEL 4 - EST PT Modifier: Quantity: 1 CPT4 Code Description: ICD-10 Diagnosis Description I87.333 Chronic venous hypertension (idiopathic) with ulcer and inflammation of L97.812 Non-pressure chronic ulcer of other part of right lower leg with fat lay L97.822 Non-pressure chronic ulcer of  other part of left lower leg with fat laye I10 Essential (primary) hypertension Modifier: bilateral lower extre er exposed r exposed Quantity: mity Electronic Signature(s) Signed: 05/23/2022 10:28:26 AM By: Carlene Coria RN Signed: 05/23/2022 6:22:21 PM By: Worthy Keeler PA-C Previous Signature: 05/23/2022 9:54:30 AM Version By: Worthy Keeler PA-C Entered By: Carlene Coria on 05/23/2022 10:28:25

## 2022-05-25 NOTE — Progress Notes (Signed)
[name], [name] ([id number]) Visit Report for 05/23/2022 Abuse Risk Screen Details [name], [name] Date of Service: 05/23/2022 8:45 AM Medical Record Number: [id number] Patient Account Number: [id number] Date of Birth/Sex: 1934/08/17 (88 y.o. F) Treating RN: Carlene Coria Primary Care Shakeema Lippman: Fulton Reek Other Clinician: Referring Jaston Havens: Self, Referral Treating Darely Becknell/Extender: Skipper Cliche in Treatment: 0 Abuse Risk Screen Items Answer ABUSE RISK SCREEN: Has anyone close to you tried to hurt or harm you recentlyo No Do you feel uncomfortable with anyone in your familyo No Has anyone forced you do things that you didnot want to doo No Electronic Signature(s) Signed: 05/25/2022 4:19:23 PM By: Carlene Coria RN Entered By: Carlene Coria on 05/23/2022 08:44:14 [name] ([id number]) -------------------------------------------------------------------------------- Activities of Daily Living Details [name] Date of Service: 05/23/2022 8:45 AM Medical Record Number: [id number] Patient Account Number: [id number] Date of Birth/Sex: 04/09/1934 (87 y.o. F) Treating RN: Carlene Coria Primary Care Attallah Ontko: Fulton Reek Other Clinician: Referring Kateland Leisinger: Self, Referral Treating Tavius Turgeon/Extender: Skipper Cliche in Treatment: 0 Activities of Daily Living Items Answer Activities of Daily Living (Please select one for each item) Drive Automobile Not Able Take Medications Need Assistance Use Telephone Completely Able Care for Appearance Need Assistance Use Toilet Need Assistance Bath / Shower Need Assistance Dress Self Need Assistance Feed Self Completely Able Walk Need Assistance Get In / Out Bed Need Assistance Housework Need Assistance Prepare Meals Need Assistance Handle Money Completely Able Shop for Self Not Able Electronic Signature(s) Signed: 05/25/2022 4:19:23 PM By: Carlene Coria RN Entered By: Carlene Coria  on 05/23/2022 08:44:55 [name] ([id number]) -------------------------------------------------------------------------------- Education Screening Details [name] Date of Service: 05/23/2022 8:45 AM Medical Record Number: [id number] Patient Account Number: [id number] Date of Birth/Sex: [birth year] (87 y.o. F) Treating RN: Carlene Coria Primary Care Corgan Mormile: Fulton Reek Other Clinician: Referring Caragh Gasper: Self, Referral Treating Mamoudou Mulvehill/Extender: Skipper Cliche in Treatment: 0 Primary Learner Assessed: Patient Learning Preferences/Education Level/Primary Language Learning Preference: Explanation Highest Education Level: High School Preferred Language: English Cognitive Barrier Language Barrier: No Translator Needed: No Memory Deficit: No Emotional Barrier: No Cultural/Religious Beliefs Affecting Medical Care: No Physical Barrier Impaired Vision: No Impaired Hearing: No Decreased Hand dexterity: No Knowledge/Comprehension Knowledge Level: Medium Comprehension Level: High Ability to understand written instructions: High Ability to understand verbal instructions: High Motivation Anxiety Level: Anxious Cooperation: Uncooperative Education Importance: Denies Need Interest in Health Problems: Uninterested Perception: Coherent Willingness to Engage in Self-Management Medium Activities: Readiness to Engage in Self-Management Medium Activities: Electronic Signature(s) Signed: 05/25/2022 4:19:23 PM By: Carlene Coria RN Entered By: Carlene Coria on 05/23/2022 08:45:37 [name] ([id number]) -------------------------------------------------------------------------------- Fall Risk Assessment Details [name] Date of Service: 05/23/2022 8:45 AM Medical Record Number: [id number] Patient Account Number: [id number] Date of Birth/Sex: [birth year] (87 y.o. F) Treating RN: Carlene Coria Primary Care Yoniel Arkwright:  Fulton Reek Other Clinician: Referring Ruthanna Macchia: Self, Referral Treating Crystall Donaldson/Extender: Skipper Cliche in Treatment: 0 Fall Risk Assessment Items Have you had 2 or more falls in the last 12 monthso 0 No Have you had any fall that resulted in injury in the last 12 monthso 0 No FALLS RISK SCREEN History of falling - immediate or within 3 months 0 No Secondary diagnosis (Do you have 2 or more medical diagnoseso) 0 No Ambulatory aid None/bed rest/wheelchair/nurse 0 No Crutches/cane/walker 0 No Furniture 0 No Intravenous therapy Access/Saline/Heparin Lock 0 No Gait/Transferring Normal/ bed rest/ wheelchair 0 No Weak (short steps with or without  shuffle, stooped but able to lift head while walking, may 0 No seek support from furniture) Impaired (short steps with shuffle, may have difficulty arising from chair, head down, impaired 0 No balance) Mental Status Oriented to own ability 0 No Electronic Signature(s) Signed: 05/25/2022 4:19:23 PM By: Carlene Coria RN Entered By: Carlene Coria on 05/23/2022 08:45:42 [name] ([id number]) -------------------------------------------------------------------------------- Foot Assessment Details [name] Date of Service: 05/23/2022 8:45 AM Medical Record Number: [id number] Patient Account Number: [id number] Date of Birth/Sex: [birth year] (87 y.o. F) Treating RN: Carlene Coria Primary Care Farzad Tibbetts: Fulton Reek Other Clinician: Referring Amir Glaus: Self, Referral Treating Luzelena Heeg/Extender: Skipper Cliche in Treatment: 0 Foot Assessment Items Site Locations + = Sensation present, - = Sensation absent, C = Callus, U = Ulcer R = Redness, W = Warmth, M = Maceration, PU = Pre-ulcerative lesion F = Fissure, S = Swelling, D = Dryness Assessment Right: Left: Other Deformity: No No Prior Foot Ulcer: No No Prior Amputation: No No Charcot Joint: No No Ambulatory Status: Ambulatory With Help Assistance  Device: Wheelchair Gait: Steady Electronic Signature(s) Signed: 05/25/2022 4:19:23 PM By: Carlene Coria RN Entered By: Carlene Coria on 05/23/2022 08:51:38 [name] ([id number]) -------------------------------------------------------------------------------- Nutrition Risk Screening Details [name] Date of Service: 05/23/2022 8:45 AM Medical Record Number: [id number] Patient Account Number: [id number] Date of Birth/Sex: Jun 02, 1934 (87 y.o. F) Treating RN: Carlene Coria Primary Care Kelven Flater: Fulton Reek Other Clinician: Referring Dat Derksen: Self, Referral Treating Aldine Grainger/Extender: Skipper Cliche in Treatment: 0 Height (in): 64 Weight (lbs): 166 Body Mass Index (BMI): 28.5 Nutrition Risk Screening Items Score Screening NUTRITION RISK SCREEN: I have an illness or condition that made me change the kind and/or amount of food I eat 0 No I eat fewer than two meals per day 0 No I eat few fruits and vegetables, or milk products 0 No I have three or more drinks of beer, liquor or wine almost every day 0 No I have tooth or mouth problems that make it hard for me to eat 0 No I don't always have enough money to buy the food I need 0 No I eat alone most of the time 0 No I take three or more different prescribed or over-the-counter drugs a day 1 Yes Without wanting to, I have lost or gained 10 pounds in the last six months 0 No I am not always physically able to shop, cook and/or feed myself 2 Yes Nutrition Protocols Good Risk Protocol Moderate Risk Protocol 0 Provide education on nutrition High Risk Proctocol Risk Level: Moderate Risk Score: 3 Electronic Signature(s) Signed: 05/25/2022 4:19:23 PM By: Carlene Coria RN Entered By: Carlene Coria on 05/23/2022 08:45:56

## 2022-05-25 NOTE — Progress Notes (Signed)
[name] ([id number]) Visit Report for 05/23/2022 Allergy List Details [name] Date of Service: 05/23/2022 8:45 AM Medical Record Number: [id number] Patient Account Number: [id number] Date of Birth/Sex: 04-Dec-1933 (88 y.o. F) Treating RN: Carlene Coria Primary Care Rukaya Kleinschmidt: Fulton Reek Other Clinician: Referring Yamira Papa: Self, Referral Treating Audry Pecina/Extender: Skipper Cliche in Treatment: 0 Allergies Active Allergies clonidine Reaction: sob Darvocet-N hydralazine esomeprazole guaifenesin phenylephrine rabeprazole telithromycin fluocinonide Iodinated Contrast Media Levaquin Reaction: nausea Statins-Hmg-Coa Reductase Inhibitors Allergy Notes Electronic Signature(s) Signed: 05/25/2022 4:19:23 PM By: Carlene Coria RN Entered By: Carlene Coria on 05/23/2022 08:42:58 [name] ([id number]) -------------------------------------------------------------------------------- Arrival Information Details [name] Date of Service: 05/23/2022 8:45 AM Medical Record Number: [id number] Patient Account Number: [id number] Date of Birth/Sex: 01-Oct-1934 (87 y.o. F) Treating RN: Carlene Coria Primary Care Myrene Bougher: Fulton Reek Other Clinician: Referring Jensyn Shave: Self, Referral Treating Delailah Spieth/Extender: Skipper Cliche in Treatment: 0 Visit Information Patient Arrived: Wheel Chair Arrival Time: 08:35 Accompanied By: husband Transfer Assistance: None Patient Identification Verified: Yes Secondary Verification Process Completed: Yes Patient Requires Transmission-Based No Precautions: Patient Has Alerts: Yes Patient Alerts: Patient on Blood Thinner DIABETIC ABI LEFT .79 02/14/22 ABI RIGHT 1.02 02/14/22 History Since Last Visit All ordered tests and consults were completed: No Added or deleted any medications: No Any new allergies or adverse reactions: No Had a fall or experienced change in activities of  daily living that may affect risk of falls: No Signs or symptoms of abuse/neglect since last visito No Hospitalized since last visit: No Implantable device outside of the clinic excluding cellular tissue based products placed in the center since last visit: No Has Dressing in Place as Prescribed: Yes Has Compression in Place as Prescribed: No Pain Present Now: Yes Electronic Signature(s) Signed: 05/25/2022 4:19:23 PM By: Carlene Coria RN Entered By: Carlene Coria on 05/23/2022 08:58:40 [name] ([id number]) -------------------------------------------------------------------------------- Clinic Level of Care Assessment Details [name] Date of Service: 05/23/2022 8:45 AM Medical Record Number: [id number] Patient Account Number: [id number] Date of Birth/Sex: May 31, 1934 (87 y.o. F) Treating RN: Carlene Coria Primary Care Eathan Groman: Fulton Reek Other Clinician: Referring Nadim Malia: Self, Referral Treating Nazair Fortenberry/Extender: Skipper Cliche in Treatment: 0 Clinic Level of Care Assessment Items TOOL 1 Quantity Score '[]'$  - Use when EandM and Procedure is performed on INITIAL visit 0 ASSESSMENTS - Nursing Assessment / Reassessment '[]'$  - General Physical Exam (combine w/ comprehensive assessment (listed just below) when performed on new 0 pt. evals) '[]'$  - 0 Comprehensive Assessment (HX, ROS, Risk Assessments, Wounds Hx, etc.) ASSESSMENTS - Wound and Skin Assessment / Reassessment '[]'$  - Dermatologic / Skin Assessment (not related to wound area) 0 ASSESSMENTS - Ostomy and/or Continence Assessment and Care '[]'$  - Incontinence Assessment and Management 0 '[]'$  - 0 Ostomy Care Assessment and Management (repouching, etc.) PROCESS - Coordination of Care '[]'$  - Simple Patient / Family Education for ongoing care 0 '[]'$  - 0 Complex (extensive) Patient / Family Education for ongoing care '[]'$  - 0 Staff obtains Programmer, systems, Records, Test Results / Process Orders '[]'$  - 0 Staff  telephones HHA, Nursing Homes / Clarify orders / etc '[]'$  - 0 Routine Transfer to another Facility (non-emergent condition) '[]'$  - 0 Routine Hospital Admission (non-emergent condition) '[]'$  - 0 New Admissions / Biomedical engineer / Ordering NPWT, Apligraf, etc. '[]'$  - 0 Emergency Hospital Admission (emergent condition) PROCESS - Special Needs '[]'$  - Pediatric / Minor Patient Management 0 '[]'$  - 0 Isolation Patient Management '[]'$  - 0 Hearing /  Language / Visual special needs '[]'$  - 0 Assessment of Community assistance (transportation, D/C planning, etc.) '[]'$  - 0 Additional assistance / Altered mentation '[]'$  - 0 Support Surface(s) Assessment (bed, cushion, seat, etc.) INTERVENTIONS - Miscellaneous '[]'$  - External ear exam 0 '[]'$  - 0 Patient Transfer (multiple staff / Civil Service fast streamer / Similar devices) '[]'$  - 0 Simple Staple / Suture removal (25 or less) '[]'$  - 0 Complex Staple / Suture removal (26 or more) '[]'$  - 0 Hypo/Hyperglycemic Management (do not check if billed separately) '[]'$  - 0 Ankle / Brachial Index (ABI) - do not check if billed separately Has the patient been seen at the hospital within the last three years: Yes Total Score: 0 Level Of Care: ____ [name] ([id number]) Electronic Signature(s) Signed: 05/25/2022 4:19:23 PM By: Carlene Coria RN Entered By: Carlene Coria on 05/23/2022 10:28:11 [name] ([id number]) -------------------------------------------------------------------------------- Compression Therapy Details [name] Date of Service: 05/23/2022 8:45 AM Medical Record Number: [id number] Patient Account Number: [id number] Date of Birth/Sex: [birth year] (88 y.o. F) Treating RN: Carlene Coria Primary Care Ole Lafon: Fulton Reek Other Clinician: Referring Kiylah Loyer: Self, Referral Treating Shantee Hayne/Extender: Skipper Cliche in Treatment: 0 Compression Therapy Performed for Wound Assessment: Wound #7 Right,Medial Lower Leg Performed  By: Clinician Carlene Coria, RN Compression Type: Three Layer Post Procedure Diagnosis Same as Pre-procedure Electronic Signature(s) Signed: 05/23/2022 10:27:46 AM By: Carlene Coria RN Entered By: Carlene Coria on 05/23/2022 10:27:46 [name] ([id number]) -------------------------------------------------------------------------------- Compression Therapy Details [name] Date of Service: 05/23/2022 8:45 AM Medical Record Number: [id number] Patient Account Number: [id number] Date of Birth/Sex: Apr 09, 1934 (87 y.o. F) Treating RN: Carlene Coria Primary Care Kinzi Frediani: Fulton Reek Other Clinician: Referring Jomarion Mish: Self, Referral Treating Daphney Hopke/Extender: Skipper Cliche in Treatment: 0 Compression Therapy Performed for Wound Assessment: Wound #8 Left,Lateral Lower Leg Performed By: Clinician Carlene Coria, RN Compression Type: Three Layer Post Procedure Diagnosis Same as Pre-procedure Electronic Signature(s) Signed: 05/23/2022 10:27:59 AM By: Carlene Coria RN Entered By: Carlene Coria on 05/23/2022 10:27:59 [name] ([id number]) -------------------------------------------------------------------------------- Encounter Discharge Information Details [name] Date of Service: 05/23/2022 8:45 AM Medical Record Number: [id number] Patient Account Number: [id number] Date of Birth/Sex: [birth year] (88 y.o. F) Treating RN: Carlene Coria Primary Care Seaira Byus: Fulton Reek Other Clinician: Referring Mae Cianci: Self, Referral Treating Carrel Leather/Extender: Skipper Cliche in Treatment: 0 Encounter Discharge Information Items Discharge Condition: Stable Ambulatory Status: Wheelchair Discharge Destination: Home Transportation: Private Auto Accompanied By: husband Schedule Follow-up Appointment: Yes Clinical Summary of Care: Electronic Signature(s) Signed: 05/23/2022 10:29:24 AM By: Carlene Coria RN Entered By: Carlene Coria  on 05/23/2022 10:29:23 [name] ([id number]) -------------------------------------------------------------------------------- Lower Extremity Assessment Details [name] Date of Service: 05/23/2022 8:45 AM Medical Record Number: [id number] Patient Account Number: [id number] Date of Birth/Sex: [birth year] (87 y.o. F) Treating RN: Carlene Coria Primary Care Merlean Pizzini: Fulton Reek Other Clinician: Referring Letta Cargile: Self, Referral Treating Yojan Paskett/Extender: Skipper Cliche in Treatment: 0 Edema Assessment Assessed: [Left: No] [Right: No] [Left: Edema] [Right: :] Calf Left: Right: Point of Measurement: 32 cm From Medial Instep 41 cm 42 cm Ankle Left: Right: Point of Measurement: 10 cm From Medial Instep 28 cm 26 cm Knee To Floor Left: Right: From Medial Instep 42 cm 42 cm Vascular Assessment Pulses: Dorsalis Pedis Palpable: [Left:Yes] [Right:Yes] Electronic Signature(s) Signed: 05/25/2022 4:19:23 PM By: Carlene Coria RN Entered By: Carlene Coria on 05/23/2022 08:57:37 [name] ([id number]) -------------------------------------------------------------------------------- Multi Wound Chart Details Patient Name: Andres Labrum,  Kathi Der Date of Service: 05/23/2022 8:45 AM Medical Record Number: [id number] Patient Account Number: [id number] Date of Birth/Sex: 1933/10/18 (88 y.o. 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  Lower N/A Extremity Extremity Comorbid History: Congestive Heart Failure, Congestive Heart Failure, N/A Hypertension, Type II Diabetes, Hypertension, Type II Diabetes, Received Radiation Received Radiation Date Acquired: 04/19/2022 04/24/2022 N/A Weeks of Treatment: 0 0 N/A Wound Status: Open Open N/A Wound Recurrence: No No N/A Measurements L x W x D (cm) 2x6.2x0.2 1.2x2.5x0.1 N/A Area (cm) : 9.739 2.356 N/A Volume (cm) : 1.948 0.236 N/A Classification: Grade 2 Grade 2 N/A Exudate Amount: Medium Medium N/A Exudate Type: Serosanguineous Serosanguineous N/A Exudate Color: red, brown red, brown N/A Granulation Amount: None Present (0%) Large (67-100%) N/A Granulation Quality: N/A Red N/A Necrotic Amount: Large (67-100%) Small (1-33%) N/A Exposed Structures: Fascia: No Fat Layer (Subcutaneous Tissue): N/A Fat Layer (Subcutaneous Tissue): Yes No Fascia: No Tendon: No Tendon: No Muscle: No Muscle: No Joint: No Joint: No Bone: No Bone: No Epithelialization: None None N/A Treatment Notes Electronic Signature(s) Signed: 05/25/2022 4:19:23 PM By: Carlene Coria RN Entered By: Carlene Coria on 05/23/2022 09:30:18 [name] ([id number]) -------------------------------------------------------------------------------- [place] Details [name] Date of Service: 05/23/2022 8:45 AM Medical Record Number: [id number] Patient Account Number: [id number] Date of Birth/Sex: 10/26/1933 (87 y.o. F) Treating RN: Carlene Coria Primary Care Sakoya Win: Fulton Reek Other Clinician: Referring Kennen Stammer: Self, Referral Treating Teofilo Lupinacci/Extender: Skipper Cliche in Treatment: 0 Active Inactive Nutrition Nursing Diagnoses: Impaired glucose control: actual or potential Goals: Patient/caregiver will maintain therapeutic glucose control Date Initiated: 05/23/2022 Target Resolution Date: 06/23/2022 Goal Status: Active Interventions: Assess HgA1c  results as ordered upon admission and as needed Assess patient nutrition upon admission and as needed per policy Notes: Wound/Skin Impairment Nursing Diagnoses: Knowledge deficit related to ulceration/compromised skin integrity Goals: Patient/caregiver will verbalize understanding of skin care regimen Date Initiated: 05/23/2022 Target Resolution Date: 06/23/2022 Goal Status: Active Ulcer/skin breakdown will have a volume reduction of 30% by week 4 Date Initiated: 05/23/2022 Target Resolution Date: 06/23/2022 Goal Status: Active Ulcer/skin breakdown will have a volume reduction of 50% by week 8 Date Initiated: 05/23/2022 Target Resolution Date: 07/23/2022 Goal Status: Active Ulcer/skin breakdown will have a volume reduction of 80% by week 12 Date Initiated: 05/23/2022 Target Resolution Date: 08/23/2022 Goal Status: Active Ulcer/skin breakdown will heal within 14 weeks Date Initiated: 05/23/2022 Target Resolution Date: 09/22/2022 Goal Status: Active Interventions: Assess patient/caregiver ability to obtain necessary supplies Assess patient/caregiver ability to perform ulcer/skin care regimen upon admission and as needed Assess ulceration(s) every visit Notes: Electronic Signature(s) Signed: 05/25/2022 4:19:23 PM By: Carlene Coria RN Entered By: Carlene Coria on 05/23/2022 09:30:01 [name] ([id number]) -------------------------------------------------------------------------------- Pain Assessment Details [name] Date of Service: 05/23/2022 8:45 AM Medical Record Number: [id number] Patient Account Number: [id number] Date of Birth/Sex: 03-06-1934 (88 y.o. F) Treating RN: Carlene Coria Primary Care Jean Alejos: Fulton Reek Other Clinician: Referring Ott Zimmerle: Self, Referral Treating Holland Nickson/Extender: Skipper Cliche in Treatment: 0 Active Problems Location of Pain Severity and Description of Pain Patient Has Paino Yes  Site Locations With  Dressing Change: Yes Duration of the Pain. Constant / Intermittento Intermittent How Long Does it Lasto Hours: Minutes: 15 Rate the pain. Current Pain Level: 0 Worst Pain Level: 10 Least Pain Level: 0 Tolerable Pain Level: 5 Character of Pain Describe the Pain: Burning Pain Management and Medication Current Pain Management: Medication: Yes Cold Application: No Rest: Yes Massage: No Activity: No T.E.N.S.: No Heat Application: No Leg drop or elevation: No Is the Current Pain Management Adequate: Inadequate How does your wound impact your activities of daily livingo Sleep: No Bathing: No Appetite: No Relationship With Others: No Bladder Continence: No Emotions: No Bowel Continence: No Work: No Toileting: No Drive: No Dressing: No Hobbies: No Electronic Signature(s) Signed: 05/25/2022 4:19:23 PM By: Carlene Coria RN Entered By: Carlene Coria on 05/23/2022 08:39:57 [name] ([id number]) -------------------------------------------------------------------------------- Patient/Caregiver Education Details [name] Date of Service: 05/23/2022 8:45 AM Medical Record Number: [id number] Patient Account Number: [id number] Date of Birth/Gender: 05/04/1934 (88 y.o. F) Treating RN: Carlene Coria Primary Care Physician: Fulton Reek Other Clinician: Referring Physician: Self, Referral Treating Physician/Extender: Skipper Cliche in Treatment: 0 Education Assessment Education Provided To: Patient Education Topics Provided Wound/Skin Impairment: Methods: Explain/Verbal Responses: State content correctly Electronic Signature(s) Signed: 05/25/2022 4:19:23 PM By: Carlene Coria RN Entered By: Carlene Coria on 05/23/2022 10:28:35 [name] ([id number]) -------------------------------------------------------------------------------- Wound Assessment Details [name] Date of Service: 05/23/2022 8:45 AM Medical  Record Number: [id number] Patient Account Number: [id number] Date of Birth/Sex: 15-Dec-1933 (87 y.o. F) Treating RN: Carlene Coria Primary Care Laksh Hinners: Fulton Reek Other Clinician: Referring Shaira Sova: Self, Referral Treating Tyrese Ficek/Extender: Skipper Cliche in Treatment: 0 Wound Status Wound Number: 7 Primary Diabetic Wound/Ulcer of the Lower Extremity Etiology: Wound Location: Right, Medial Lower Leg Wound Status: Open Wounding Event: Gradually Appeared Comorbid Congestive Heart Failure, Hypertension, Type II Date Acquired: 04/19/2022 History: Diabetes, Received Radiation Weeks Of Treatment: 0 Clustered Wound: No Photos Wound Measurements Length: (cm) 2 Width: (cm) 6.2 Depth: (cm) 0.2 Area: (cm) 9.739 Volume: (cm) 1.948 % Reduction in Area: % Reduction in Volume: Epithelialization: None Tunneling: No Undermining: No Wound Description Classification: Grade 2 Exudate Amount: Medium Exudate Type: Serosanguineous Exudate Color: red, brown Foul Odor After Cleansing: No Slough/Fibrino Yes Wound Bed Granulation Amount: None Present (0%) Exposed Structure Necrotic Amount: Large (67-100%) Fascia Exposed: No Necrotic Quality: Adherent Slough Fat Layer (Subcutaneous Tissue) Exposed: No Tendon Exposed: No Muscle Exposed: No Joint Exposed: No Bone Exposed: No Electronic Signature(s) Signed: 05/25/2022 4:19:23 PM By: Carlene Coria RN Entered By: Carlene Coria on 05/23/2022 08:55:30 [name] ([id number]) -------------------------------------------------------------------------------- Wound Assessment Details [name] Date of Service: 05/23/2022 8:45 AM Medical Record Number: [id number] Patient Account Number: [id number] Date of Birth/Sex: [birth year] (87 y.o. F) Treating RN: Carlene Coria Primary Care Joliyah Lippens: Fulton Reek Other Clinician: Referring Eh Sauseda: Self, Referral Treating Lilliahna Schubring/Extender: Skipper Cliche in Treatment:  0 Wound Status Wound Number: 8 Primary Diabetic Wound/Ulcer of the Lower Extremity Etiology: Wound Location: Left, Lateral Lower Leg Wound Status: Open Wounding Event: Trauma Comorbid Congestive Heart Failure, Hypertension, Type II Date Acquired: 04/24/2022 History: Diabetes, Received Radiation Weeks Of Treatment: 0 Clustered Wound: No Photos Wound Measurements Length: (cm) 1.2 Width: (cm) 2.5 Depth: (cm) 0.1 Area: (cm) 2.356 Volume: (cm) 0.236 % Reduction in Area: % Reduction in Volume: Epithelialization: None Tunneling: No Undermining: No Wound Description Classification: Grade 2 Exudate Amount: Medium Exudate Type: Serosanguineous Exudate Color: red, brown Foul Odor After Cleansing: No  Slough/Fibrino Yes Wound Bed Granulation Amount: Large (67-100%) Exposed Structure Granulation Quality: Red Fascia Exposed: No Necrotic Amount: Small (1-33%) Fat Layer (Subcutaneous Tissue) Exposed: Yes Necrotic Quality: Adherent Slough Tendon Exposed: No Muscle Exposed: No Joint Exposed: No Bone Exposed: No Treatment Notes Wound #8 (Lower Leg) Wound Laterality: Left, Lateral Cleanser Wound Cleanser Discharge Instruction: Wash your hands with soap and water. Remove old dressing, discard into plastic bag and place into trash. Cleanse the wound with Wound Cleanser prior to applying a clean dressing using gauze sponges, not tissues or cotton balls. Do not scrub or use excessive force. Pat dry using gauze sponges, not tissue or cotton balls. ODALIS, JORDAN ([id number]) Peri-Wound Care Topical Primary Dressing Silvercel 4 1/4x 4 1/4 (in/in) Discharge Instruction: Apply Silvercel 4 1/4x 4 1/4 (in/in) as instructed Secondary Dressing ABD Pad 5x9 (in/in) Discharge Instruction: Cover with ABD pad Gauze Discharge Instruction: As directed: dry, moistened with saline or moistened with Dakins Solution Secured With Compression Wrap 3-LAYER WRAP - Profore Lite LF 3 Multilayer  Compression Bandaging System Discharge Instruction: Apply 3 multi-layer wrap as prescribed. Compression Stockings Add-Ons Electronic Signature(s) Signed: 05/25/2022 4:19:23 PM By: Carlene Coria RN Entered By: Carlene Coria on 05/23/2022 08:56:35 [name] ([id number]) -------------------------------------------------------------------------------- Vitals Details [name] Date of Service: 05/23/2022 8:45 AM Medical Record Number: [id number] Patient Account Number: [id number] Date of Birth/Sex: [birth year] (88 y.o. F) Treating RN: Carlene Coria Primary Care Kaleah Hagemeister: Fulton Reek Other Clinician: Referring Kadeidra Coryell: Self, Referral Treating Kaynen Minner/Extender: Skipper Cliche in Treatment: 0 Vital Signs Time Taken: 08:39 Temperature (F): 97.6 Height (in): 64 Pulse (bpm): 62 Source: Stated Respiratory Rate (breaths/min): 18 Weight (lbs): 166 Blood Pressure (mmHg): 136/77 Source: Stated Reference Range: 80 - 120 mg / dl Body Mass Index (BMI): 28.5 Electronic Signature(s) Signed: 05/25/2022 4:19:23 PM By: Carlene Coria RN Entered By: Carlene Coria on 05/23/2022 08:40:38

## 2022-05-28 IMAGING — US US RENAL
1 series · 14 of 25 positions shown · non-contrast
Comparison: Prior ultrasound from 04/30/2016.

CLINICAL DATA: Initial evaluation for acute renal failure.

EXAM:
RENAL / URINARY TRACT ULTRASOUND COMPLETE

[Series 1: us renal · 14 of 38 slices shown]
[im 1/38]
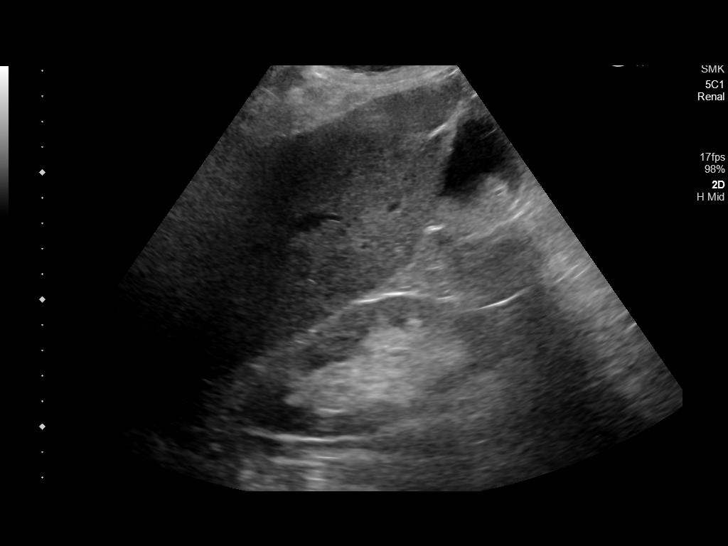
[im 4/38]
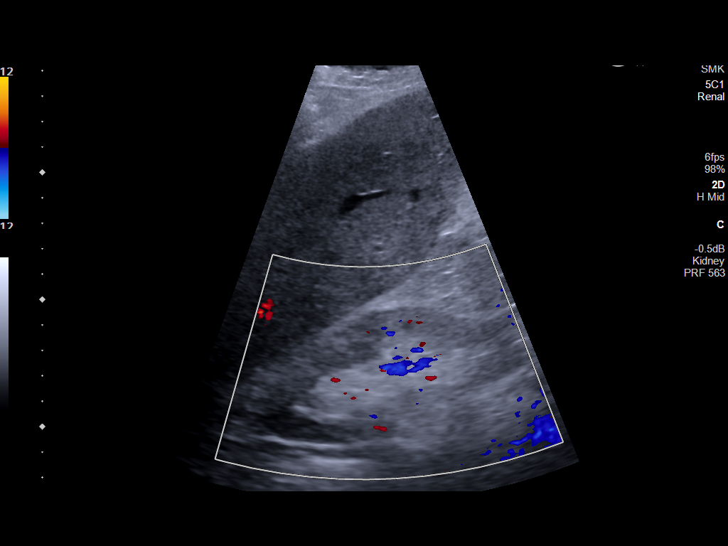
[im 7/38]
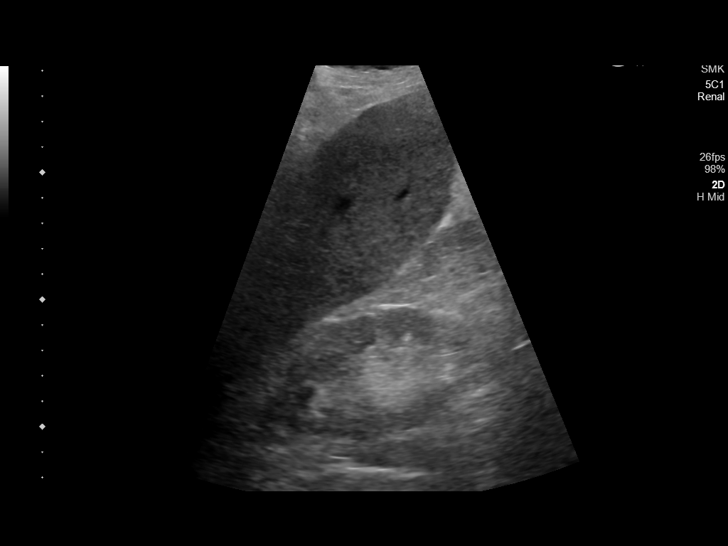
[im 10/38]
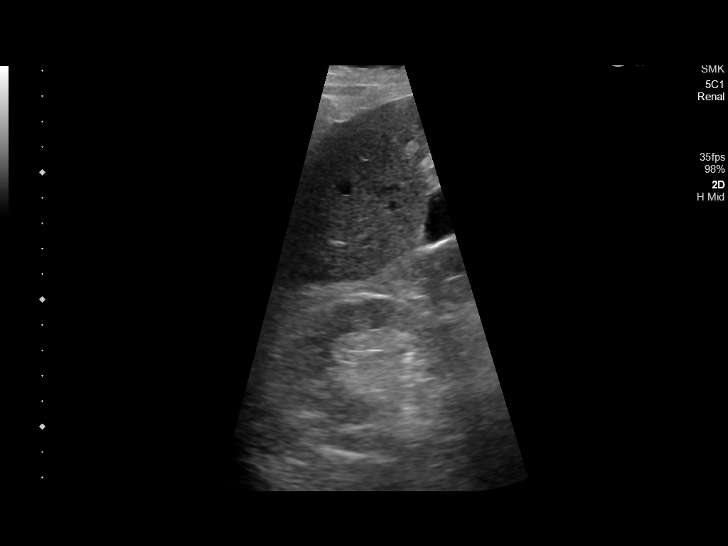
[im 13/38]
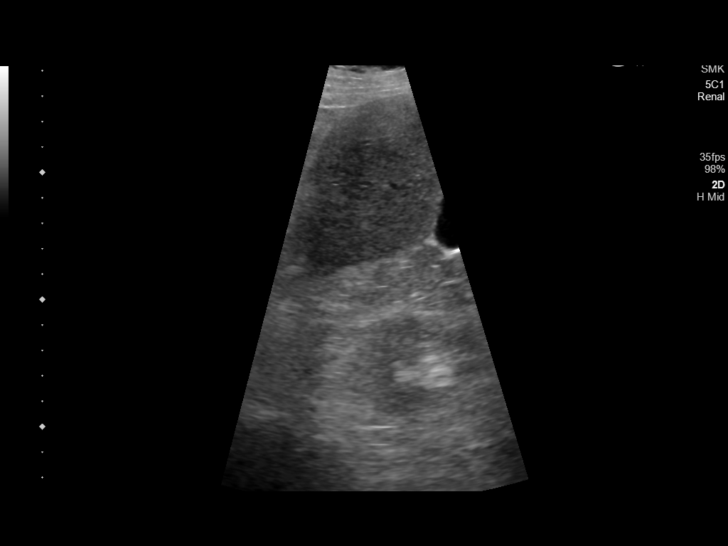
[im 14/38]
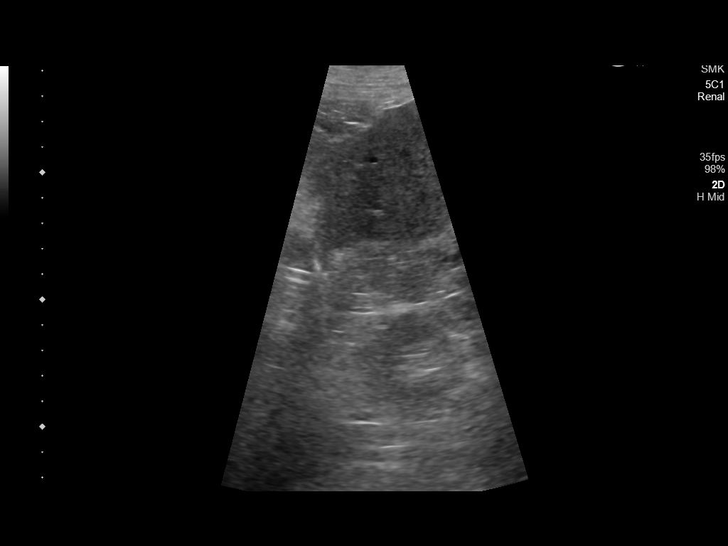
[im 17/38]
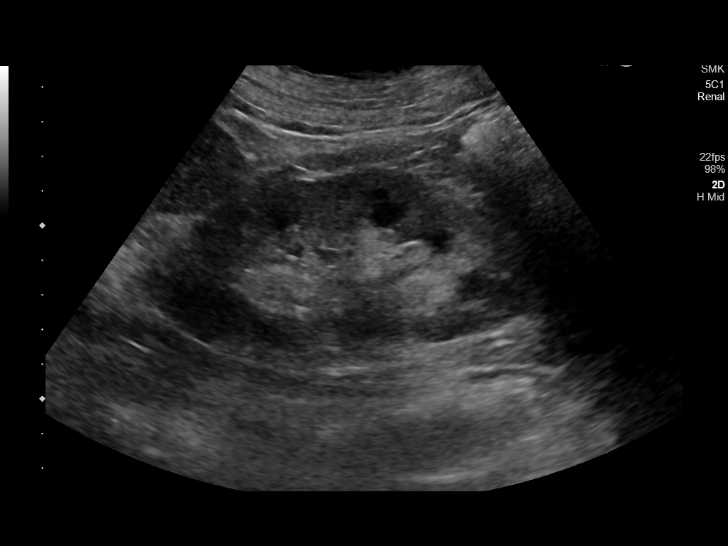
[im 21/38]
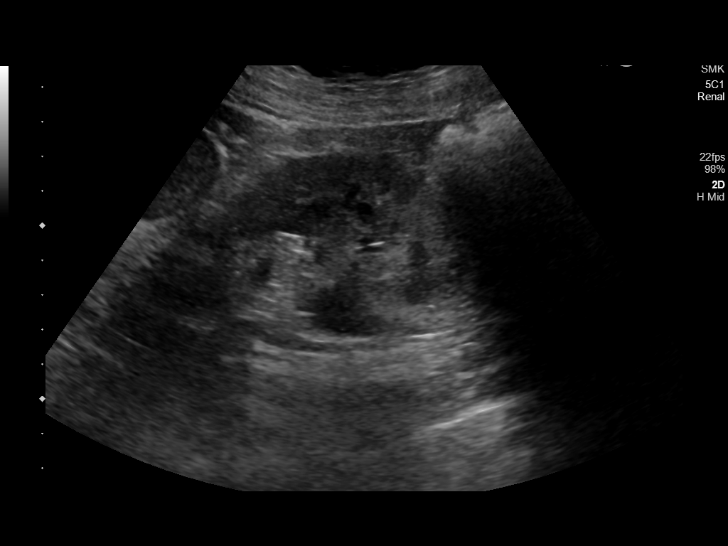
[im 24/38]
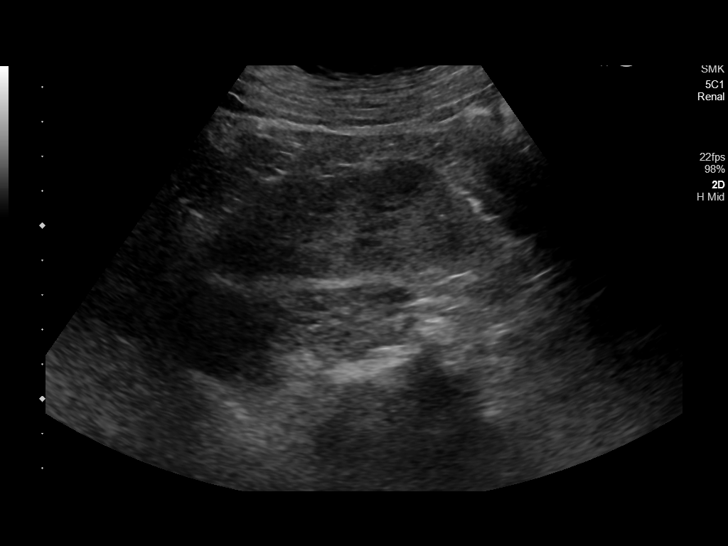
[im 25/38]
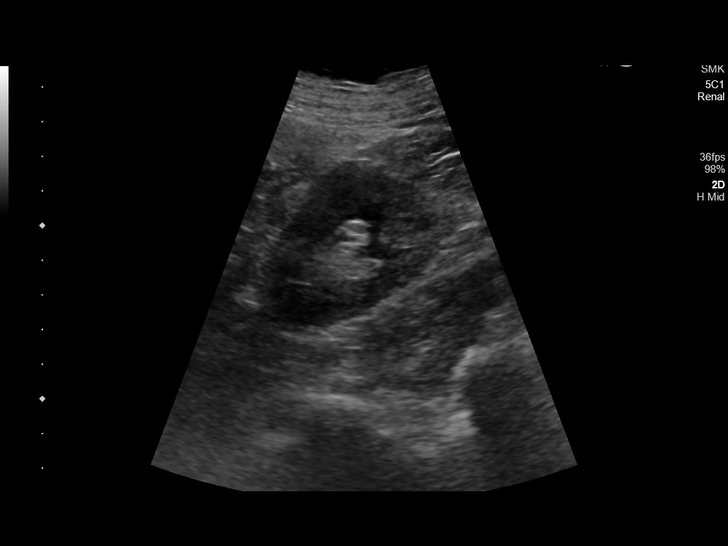
[im 28/38]
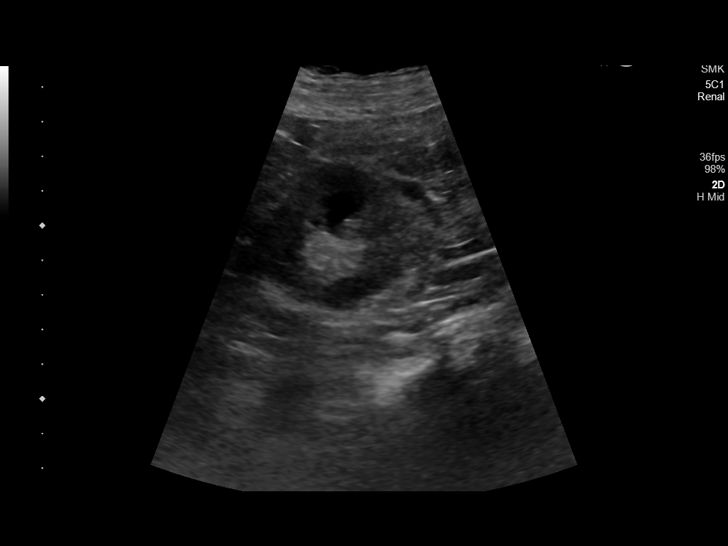
[im 31/38]
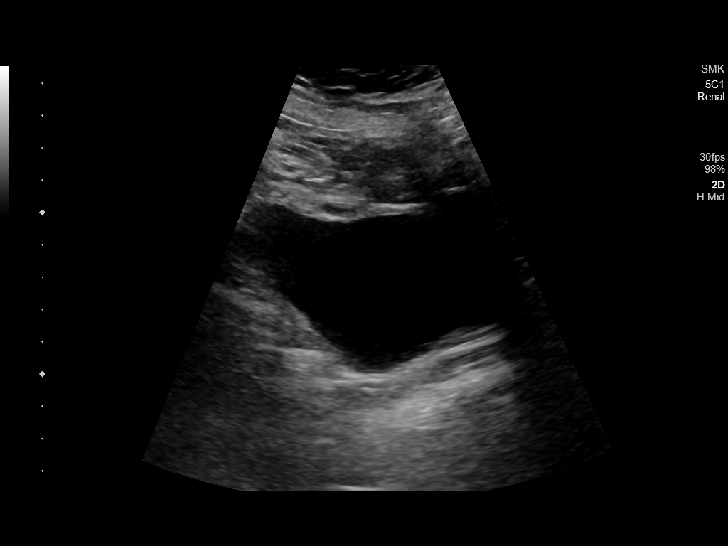
[im 34/38]
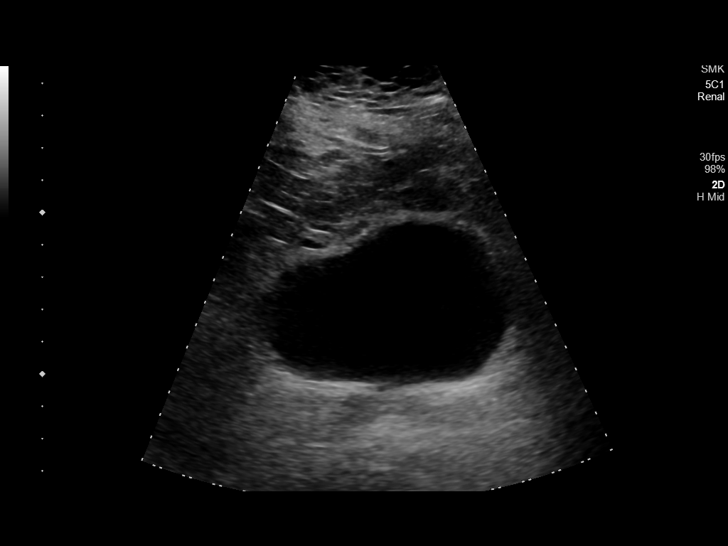
[im 38/38]
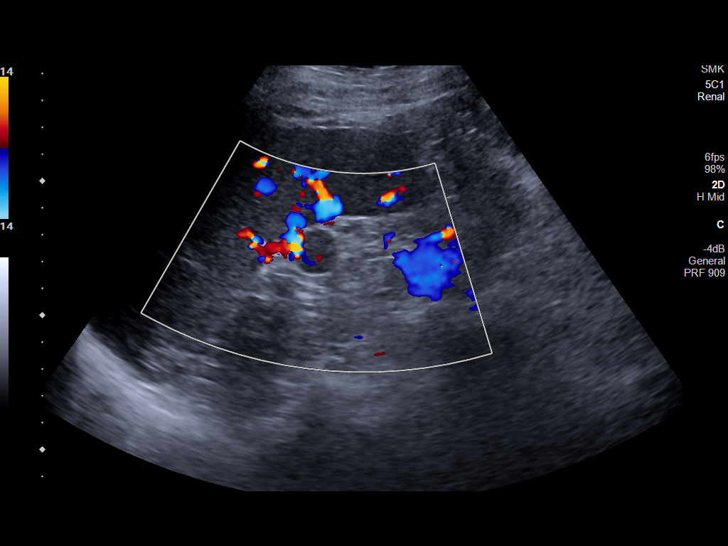

[14 of 25 positions shown; findings below may reference images not displayed]

FINDINGS: Right Kidney:

Renal measurements: 11.1 x 4.7 x 4.9 cm = volume: 134.8 mL.
Increased echogenicity seen within the renal parenchyma. No
nephrolithiasis or hydronephrosis. No focal renal mass.

Left Kidney:

Renal measurements: 11.5 x 5.0 x 5.1 cm = volume: 152.3 mL. Mildly
increased echogenicity within the renal parenchyma. No
nephrolithiasis or hydronephrosis. No focal renal mass.

Bladder:

Appears normal for degree of bladder distention.

Other:

1.7 cm splenule noted.
IMPRESSION: 1. Mildly increased echogenicity within the renal parenchyma,
suggesting medical renal disease.
2. No hydronephrosis or other acute abnormality.

## 2022-05-30 ENCOUNTER — Encounter: Payer: Medicare Other | Admitting: Physician Assistant

## 2022-05-30 DIAGNOSIS — E1151 Type 2 diabetes mellitus with diabetic peripheral angiopathy without gangrene: Secondary | ICD-10-CM | POA: Diagnosis not present

## 2022-05-30 NOTE — Progress Notes (Addendum)
[name], [name] ([id number]) Visit Report for 05/30/2022 Chief Complaint Document Details [name] Date of Service: 05/30/2022 3:30 PM Medical Record Number: [id number] Patient Account Number: [id number] Date of Birth/Sex: 1933/11/02 (88 y.o. F) Treating RN: Carlene Coria Primary Care Provider: Fulton Reek Other Clinician: Referring Provider: Fulton Reek Treating Provider/Extender: Skipper Cliche in Treatment: 1 Information Obtained from: Patient Chief Complaint Bilateral LE Ulcers Electronic Signature(s) Signed: 05/30/2022 3:14:27 PM By: Worthy Keeler PA-C Entered By: Worthy Keeler on 05/30/2022 15:14:27 [name] ([id number]) -------------------------------------------------------------------------------- HPI Details [name] Date of Service: 05/30/2022 3:30 PM Medical Record Number: [id number] Patient Account Number: [id number] Date of Birth/Sex: 03-15-1934 (87 y.o. F) Treating RN: Carlene Coria Primary Care Provider: Fulton Reek Other Clinician: Referring Provider: Fulton Reek Treating Provider/Extender: Skipper Cliche in Treatment: 1 History of Present Illness HPI Description: ADMISSION 07/14/2020. This is a medically complex 88 year old woman who is sent over urgently from her primary care doctor Dr. Doy Hutching at [facility] and we worked her in this afternoon. She is apparently developed a blister on the dorsal foot sometime over the last 2 weeks on the right which seems to have developed a hemorrhagic component to this. The history here is somewhat vague but this is happened since she left the hospital on 07/02/2020. She also has a blistered area on the left dorsal foot although this does not have a hemorrhagic component to it and it is not threatened to open where is the right side is definitely leaking fluid and could easily break down. I do not believe she has been doing anything specific to  this. With regards to her medical history I have reviewed the discharge summary from the hospital from 06/22/2020 through 07/02/2020. 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 She was given prednisone but was not felt to be a candidate for immunosuppression secondary to her age and comorbidities. She is ANA positive p-ANCA positive. She had a right IJ catheter placed on 9/24 and is being done on dialysis. She is a diabetic. She takes prednisone 20 mg twice daily. She was on aspirin and Plavix although I think these have been put on hold. Past medical history includes; pauci-immune crescentic glomerulonephritis. History of peripheral edema and type 2 diabetes left carotid artery stenosis with a history of a CVA, coronary artery disease and hypertension. She states that she tolerates dialysis reasonably poorly secondary to "butt cramps". She says that when she gets discomfort they stop her dialysis. This probably has something to do with her fluid overloaded state I cannot see any arterial evaluation in her record in [facility]. We could not do arterial ABIs on her because of complaints of discomfort 10/20; 1 week follow-up. This is a patient who is a diabetic. She recently developed acute renal failure because of pauci-immune glomerulonephritis. Sometime during her initial hospitalization she developed a wound on her right foot tremendous swelling in both legs. She was sent over here urgently last week by her primary care physician with what looks to be hematoma still contained on the right dorsal foot. We put silver alginate on this and put her in compression to control some of the swelling. The left leg had a similar appearance with severe edema in the left dorsal foot so we put that under compression as well. When she arrived back for nurse follow-up on Friday there was some drainage coming out of the wound  which our nurse cultured. This is come back showing Pseudomonas Enterococcus faecalis and staph aureus although the identification of the staph aureus is not complete. I empirically put her on cefdinir 300 mg after dialysis on Tuesday Thursday and Saturday she is only had one dose. She has not been systemically unwell 10/27 1 week follow-up. With considerable help of her nursing staff I was finally able to speak to her nephrologist who manages her at dialysis. Started her on vancomycin and ceftazidime. This to cover the combination of methicillin sensitive staph aureus Enterococcus faecalis and Pseudomonas. Although the staph aureus was methicillin sensitive and the Enterococcus ampicillin sensitive vancomycin provided the best alternative here that can be given at dialysis and ceftazidime to cover the Pseudomonas. She had a traumatic wound on the top of her foot and a complicated abscess. She has significant tissue destruction from this although the wound is looking a lot better today. We are using silver alginate to the surface. X-ray of the foot did not show any deep obvious infection or osteomyelitis 12/1; since the patient was last here she was hospitalized from 11/3 through 11/10 with coag negative staphylococcal bacteremia. I do not know the the exact cause of this was determined. She was seen by vascular and they replaced her permacath on the right side of her neck. Her repeat blood cultures were negative. She is still on IV vancomycin ando Ceftazidime at dialysis. She generally feels systemically well she does not have a fever she is still receiving dialysis but reports she voids up to 8 times a day She has home health changing the dressing I believe they are using silver alginate under 3 layer compression. In addition to the large wound on her right anterior foot that we initially saw her for she has apparently a right lateral leg wound that she suffered during a fall and 2 skin tears on the  left anterior lower leg 12/8; patient comes in looking a lot better this week. One of the 2 skin tears she has on the left leg is healed the other is smaller. The area on the right lateral leg looks improved and her original wound on the right dorsal foot is a lot smaller and looks a lot better. We used Hydrofera Blue to all wound areas under 3 layer compression.. Apparently home health had some trouble with the compression wraps. 12/15 patient comes in with all of her wounds smaller including her original deep punched out wound on the right dorsal foot as well as the skin tears on the right posterior lateral calf and the left anterior lower leg. We have been using Hydrofera Blue under compression excellent results 10/12/2020 upon evaluation today patient appears to be doing decently well currently in regard to her foot ulcer. She does have a small hole still open with some tunneling that really goes proximally in the foot at around the 12:00 location. There is really not significant undermining other locations. This appears to be a small area of where there was a little fluid trapped. Nonetheless I do feel like this is still doing quite well and in general I think that she is going to benefit from possibly packing into this area with endoform in order to keep the area open while it granulates in. 11/09/2020 upon evaluation today patient appears to be doing really about the same in regard to her wound. There is a little reopening towards the distal portion of the wound bed but again I think this is just more  as a result of the dressing possibly getting stuck even to little bit of the scar tissue nonetheless it also anything looks worse although I am not sure the endoform is really doing the job for her. I think we may want to try something a little different here I think packing with a silver alginate dressing may be optimal as compared to what we tried at this point. Is been almost a month since have  seen her and we really have not seen dramatic improvement. 12/07/2020 upon evaluation today patient appears to be doing well with regard to the dorsal surface of her foot. Overall I feel like this looks to be completely healed today and this is excellent news. There is no evidence of active infection which is great news. No fevers, chills, nausea, vomiting, or diarrhea. [name], [name] ([id number]) READMISSION 01/26/2022 This is an 88 year old woman that we had in clinic October 21 through March 22 with bilateral lower extremity wounds on the dorsal feet at that time she had pauci-immune glomerulonephritis but was not yet on dialysis. We managed to heal her out. As I remember she had severe bilateral lower extremity edema secondary to acute on chronic renal failure. She arrives in clinic this time with a wound on her distal left dorsal foot that is been there for about 6 months. Completely eschar covered she has been treating this with Neosporin. She said she is on IV vancomycin and cefepime at dialysis although we have no independent verification of this. This is due to be finished on 01/30/2022. She says it was for this foot but she does not say that she has had x-rays and MRI etc. Past medical history is extensive; she has type 2 diabetes although not currently in any treatment, end-stage renal disease on dialysis Monday Wednesday and Friday, coronary artery disease status post CABG, combined systolic and diastolic heart failure, peripheral vascular disease, cerebrovascular disease. Carotid artery stenosis, COPD, breast cancer ABI in our clinic was 0.79. I cannot find that she has had previous noninvasive arterial studies. She is only able to walk around the house does not describe claudication that I can elicit Upon inspection patient's wounds actually appear to be doing decently well. On the left foot this is mainly dry drainage, bleeding, and Iodoflex which is stuck on the surface of the wound  I was able to get that off mechanically she did not want any sharp debridement whatsoever fortunately is able to get it off mechanically without using that just using saline and gauze. Subsequently I think this is otherwise doing quite well. On the right leg she does have swelling and she had a blister that popped up this feels like it almost completely sealed down but it still weeping and leaking a little bit I think we need to manage that as well to be honest. 02-14-2022 upon evaluation today patient appears to be doing well currently in regard to the wound on her foot. She has been tolerating the dressing changes there is a little bit of film buildup here in the patient last week was very resistant to debridement because her ABIs were somewhat low I was avoiding that as well. However she did see Dr. Lazaro Arms and he apparently told her that her ABIs were good everything appeared to be fine and to be honest I think this is excellent news. I am very pleased to hear this and I do believe doing some sharp debridement would be helpful at this point as well. 02-21-2022 upon  evaluation today patient appears to be doing well with regard to her wounds. Both are showing signs of improvement this is great news. Fortunately I do not see any evidence of active infection locally or systemically at this time which is great news. No fevers, chills, nausea, vomiting, or diarrhea. 03-02-2022 upon evaluation today patient appears to be doing better in regard to her right leg which appears to be healed. In regards to her left leg this on the foot at the top still has a small opening at this time. Fortunately there does not appear to be any signs of infection. With that being said I do think we may want to switch over to collagen based dressing to see if we get this to feeling a little bit better. 03-09-2022 upon evaluation today patient appears to be doing well currently in regard to her wound on the top of her left foot. I am  very pleased with where things stand and I think we are headed in the right direction here. She is still having a lot of trouble with the compression wrap. She is doing extremely well on the right side with the Tubigrip so I think we can probably switch over to using the Tubigrip on the left side as well especially since the collagen is getting so dry I do not believe this is doing her as much good as I would like for her to to be honest. 03-23-2022 upon evaluation today patient appears to be doing well with regard to her foot ulcer. This is pretty much healed at this point. Fortunately I see no signs of infection which is great news. The biggest issue I think is good to be keeping her edema under control. The Tubigrip is not very tight but unfortunately rolls down the cuts and on the top of her leg I think she needs something to try to help keep this under better control more evenly she has done well she tells me in the past with an Ace wrap I think we can probably give that a shot. Readmission: 05-23-2022 upon evaluation today patient presents for reevaluation here in the clinic she was last seen June 23, 2022. At that time we got her compression wraps with juxta lites. Unfortunately she did not wear these appropriately she tells me that they were unable to get them on. I am not really sure why that is and to be peripherally honest I think that this to some degree is just a matter of they let her legs get too swollen and out-of-control and then subsequently ended up with it getting to the point that the juxta lites will be been fit. Either way based on what I am seeing at this time I do believe that the patient has been required compression here in the office to get this under control I think the 3 layer compression wraps probably the right way to go she is not going to tolerate anything too tight and the 4-layer I think would be much too tight for her. Patient's past medical history really has  not changed she does have hypertension, end-stage renal disease, and she is on renal dialysis. Unfortunately they are not able to remove so much fluid as it throws her entire electrolyte imbalance that subsequently causes extreme cramping. 05-30-2022 upon evaluation today patient appears to be doing about the same in regard to her wounds. She needs to have some compression but she took off the 3 layer compression wrap she tells me that it  was about to make a fall several times and she could not continue to deal with that. She is states that there is no way she can afford to break anything which I completely understand. With that being said I do believe that the patient should likely have some type of compression on she told me that "she is not here for me to fix her fluid problems she is here for me to fix her wounds." With that being said I explained to the patient that there is not any way to do 1 without the other because the fluid is exacerbating the wound. She did not have much to say that to be honest. Electronic Signature(s) Signed: 05/30/2022 4:08:04 PM By: Worthy Keeler PA-C Entered By: Worthy Keeler on 05/30/2022 16:08:04 [name] ([id number]) -------------------------------------------------------------------------------- Physical Exam Details [name] Date of Service: 05/30/2022 3:30 PM Medical Record Number: [id number] Patient Account Number: [id number] Date of Birth/Sex: 04-16-1934 (87 y.o. F) Treating RN: Carlene Coria Primary Care Provider: Fulton Reek Other Clinician: Referring Provider: Fulton Reek Treating Provider/Extender: Skipper Cliche in Treatment: 1 Constitutional Well-nourished and well-hydrated in no acute distress. Respiratory normal breathing without difficulty. Psychiatric this patient is able to make decisions and demonstrates good insight into disease process. Alert and Oriented x 3. pleasant and  cooperative. Notes Upon inspection patient's wounds again are showing signs of a little bit improvement on the left side. With that being said on the right side this is still quite deep. There is also a lot of drainage from both locations she really needs some compression. Electronic Signature(s) Signed: 05/30/2022 4:08:22 PM By: Worthy Keeler PA-C Entered By: Worthy Keeler on 05/30/2022 16:08:22 [name] ([id number]) -------------------------------------------------------------------------------- Physician Orders Details [name] Date of Service: 05/30/2022 3:30 PM Medical Record Number: [id number] Patient Account Number: [id number] Date of Birth/Sex: Sep 27, 1934 (87 y.o. F) Treating RN: Carlene Coria Primary Care Provider: Fulton Reek Other Clinician: Referring Provider: Fulton Reek Treating Provider/Extender: Skipper Cliche in Treatment: 1 Verbal / Phone Orders: No Diagnosis Coding ICD-10 Coding Code Description [phone] Chronic venous hypertension (idiopathic) with ulcer and inflammation of bilateral lower extremity L97.812 Non-pressure chronic ulcer of other part of right lower leg with fat layer exposed L97.822 Non-pressure chronic ulcer of other part of left lower leg with fat layer exposed I10 Essential (primary) hypertension Z99.2 Dependence on renal dialysis I12.0 Hypertensive chronic kidney disease with stage 5 chronic kidney disease or end stage renal disease Follow-up Appointments o Return Appointment in 1 week. Bathing/ Shower/ Hygiene o May shower with wound dressing protected with water repellent cover or cast protector. Anesthetic (Use 'Patient Medications' Section for Anesthetic Order Entry) o Lidocaine applied to wound bed Edema Control - Lymphedema / Segmental Compressive Device / Other o Optional: One layer of unna paste to top of compression wrap (to act as an anchor). o Tubigrip single layer applied. -  size D o Elevate, Exercise Daily and Avoid Standing for Long Periods of Time. o Elevate legs to the level of the heart and pump ankles as often as possible o Elevate leg(s) parallel to the floor when sitting. Wound Treatment Wound #7 - Lower Leg Wound Laterality: Right, Medial Cleanser: Byram Ancillary Kit - 15 Day Supply (DME) (Generic) 3 x Per Week/30 Days Discharge Instructions: Use supplies as instructed; Kit contains: (15) Saline Bullets; (15) 3x3 Gauze; 15 pr Gloves Cleanser: Soap and Water 3 x Per Week/30 Days Discharge Instructions: Gently cleanse wound  with antibacterial soap, rinse and pat dry prior to dressing wounds Cleanser: Wound Cleanser 3 x Per Week/30 Days Discharge Instructions: Wash your hands with soap and water. Remove old dressing, discard into plastic bag and place into trash. Cleanse the wound with Wound Cleanser prior to applying a clean dressing using gauze sponges, not tissues or cotton balls. Do not scrub or use excessive force. Pat dry using gauze sponges, not tissue or cotton balls. Primary Dressing: Silvercel 4 1/4x 4 1/4 (in/in) (DME) (Generic) 3 x Per Week/30 Days Discharge Instructions: Apply Silvercel 4 1/4x 4 1/4 (in/in) as instructed Secondary Dressing: Gauze (DME) (Generic) 3 x Per Week/30 Days Discharge Instructions: As directed: dry, Secured With: Tubigrip Size D, 3x10 (in/yd) 3 x Per Week/30 Days Wound #8 - Lower Leg Wound Laterality: Left, Lateral Cleanser: Byram Ancillary Kit - 15 Day Supply (DME) (Generic) 3 x Per Week/30 Days Discharge Instructions: Use supplies as instructed; Kit contains: (15) Saline Bullets; (15) 3x3 Gauze; 15 pr Gloves Cleanser: Soap and Water 3 x Per Week/30 Days Discharge Instructions: Gently cleanse wound with antibacterial soap, rinse and pat dry prior to dressing wounds Weiss, Sarah T. ([id number]) Cleanser: Wound Cleanser 3 x Per Week/30 Days Discharge Instructions: Wash your hands with soap and water. Remove  old dressing, discard into plastic bag and place into trash. Cleanse the wound with Wound Cleanser prior to applying a clean dressing using gauze sponges, not tissues or cotton balls. Do not scrub or use excessive force. Pat dry using gauze sponges, not tissue or cotton balls. Primary Dressing: Silvercel 4 1/4x 4 1/4 (in/in) (DME) (Generic) 3 x Per Week/30 Days Discharge Instructions: Apply Silvercel 4 1/4x 4 1/4 (in/in) as instructed Secondary Dressing: Gauze (DME) (Generic) 3 x Per Week/30 Days Discharge Instructions: As directed: dry, Secured With: Tubigrip Size D, 3x10 (in/yd) 3 x Per Week/30 Days Electronic Signature(s) Signed: 05/30/2022 4:37:11 PM By: Worthy Keeler PA-C Signed: 06/01/2022 8:44:03 PM By: Carlene Coria RN Entered By: Carlene Coria on 05/30/2022 16:16:40 [name] ([id number]) -------------------------------------------------------------------------------- Problem List Details [name] Date of Service: 05/30/2022 3:30 PM Medical Record Number: [id number] Patient Account Number: [id number] Date of Birth/Sex: May 08, 1934 (88 y.o. F) Treating RN: Carlene Coria Primary Care Provider: Fulton Reek Other Clinician: Referring Provider: Fulton Reek Treating Provider/Extender: Skipper Cliche in Treatment: 1 Active Problems ICD-10 Encounter Code Description Active Date MDM Diagnosis I87.333 Chronic venous hypertension (idiopathic) with ulcer and inflammation of 05/23/2022 No Yes bilateral lower extremity L97.812 Non-pressure chronic ulcer of other part of right lower leg with fat layer 05/23/2022 No Yes exposed L97.822 Non-pressure chronic ulcer of other part of left lower leg with fat layer 05/23/2022 No Yes exposed [place] (primary) hypertension 05/23/2022 No Yes Z99.2 Dependence on renal dialysis 05/23/2022 No Yes I12.0 Hypertensive chronic kidney disease with stage 5 chronic kidney disease 05/23/2022 No Yes or end stage renal  disease Inactive Problems Resolved Problems Electronic Signature(s) Signed: 05/30/2022 3:14:24 PM By: Worthy Keeler PA-C Entered By: Worthy Keeler on 05/30/2022 15:14:24 [name] ([id number]) -------------------------------------------------------------------------------- Progress Note Details [name] Date of Service: 05/30/2022 3:30 PM Medical Record Number: [id number] Patient Account Number: [id number] Date of Birth/Sex: 08/24/1934 (87 y.o. F) Treating RN: Carlene Coria Primary Care Provider: Fulton Reek Other Clinician: Referring Provider: Fulton Reek Treating Provider/Extender: Skipper Cliche in Treatment: 1 Subjective Chief Complaint Information obtained from Patient Bilateral LE Ulcers History of Present Illness (HPI) ADMISSION 07/14/2020. This is a medically complex  88 year old woman who is sent over urgently from her primary care doctor Dr. Doy Hutching at [facility] and we worked her in this afternoon. She is apparently developed a blister on the dorsal foot sometime over the last 2 weeks on the right which seems to have developed a hemorrhagic component to this. The history here is somewhat vague but this is happened since she left the hospital on 07/02/2020. She also has a blistered area on the left dorsal foot although this does not have a hemorrhagic component to it and it is not threatened to open where is the right side is definitely leaking fluid and could easily break down. I do not believe she has been doing anything specific to this. With regards to her medical history I have reviewed the discharge summary from the hospital from 06/22/2020 through 07/02/2020. 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 She was given prednisone but was not felt to be a candidate for immunosuppression secondary to her age and comorbidities. She is ANA  positive p-ANCA positive. She had a right IJ catheter placed on 9/24 and is being done on dialysis. She is a diabetic. She takes prednisone 20 mg twice daily. She was on aspirin and Plavix although I think these have been put on hold. Past medical history includes; pauci-immune crescentic glomerulonephritis. History of peripheral edema and type 2 diabetes left carotid artery stenosis with a history of a CVA, coronary artery disease and hypertension. She states that she tolerates dialysis reasonably poorly secondary to "butt cramps". She says that when she gets discomfort they stop her dialysis. This probably has something to do with her fluid overloaded state I cannot see any arterial evaluation in her record in  link. We could not do arterial ABIs on her because of complaints of discomfort 10/20; 1 week follow-up. This is a patient who is a diabetic. She recently developed acute renal failure because of pauci-immune glomerulonephritis. Sometime during her initial hospitalization she developed a wound on her right foot tremendous swelling in both legs. She was sent over here urgently last week by her primary care physician with what looks to be hematoma still contained on the right dorsal foot. We put silver alginate on this and put her in compression to control some of the swelling. The left leg had a similar appearance with severe edema in the left dorsal foot so we put that under compression as well. When she arrived back for nurse follow-up on Friday there was some drainage coming out of the wound which our nurse cultured. This is come back showing Pseudomonas Enterococcus faecalis and staph aureus although the identification of the staph aureus is not complete. I empirically put her on cefdinir 300 mg after dialysis on Tuesday Thursday and Saturday she is only had one dose. She has not been systemically unwell 10/27 1 week follow-up. With considerable help of her nursing staff I was  finally able to speak to her nephrologist who manages her at dialysis. Started her on vancomycin and ceftazidime. This to cover the combination of methicillin sensitive staph aureus Enterococcus faecalis and Pseudomonas. Although the staph aureus was methicillin sensitive and the Enterococcus ampicillin sensitive vancomycin provided the best alternative here that can be given at dialysis and ceftazidime to cover the Pseudomonas. She had a traumatic wound on the top of her foot and a complicated abscess. She has significant tissue destruction from this although the wound is looking a lot better  today. We are using silver alginate to the surface. X-ray of the foot did not show any deep obvious infection or osteomyelitis 12/1; since the patient was last here she was hospitalized from 11/3 through 11/10 with coag negative staphylococcal bacteremia. I do not know the the exact cause of this was determined. She was seen by vascular and they replaced her permacath on the right side of her neck. Her repeat blood cultures were negative. She is still on IV vancomycin ando Ceftazidime at dialysis. She generally feels systemically well she does not have a fever she is still receiving dialysis but reports she voids up to 8 times a day She has home health changing the dressing I believe they are using silver alginate under 3 layer compression. In addition to the large wound on her right anterior foot that we initially saw her for she has apparently a right lateral leg wound that she suffered during a fall and 2 skin tears on the left anterior lower leg 12/8; patient comes in looking a lot better this week. One of the 2 skin tears she has on the left leg is healed the other is smaller. The area on the right lateral leg looks improved and her original wound on the right dorsal foot is a lot smaller and looks a lot better. We used Hydrofera Blue to all wound areas under 3 layer compression.. Apparently home health had  some trouble with the compression wraps. 12/15 patient comes in with all of her wounds smaller including her original deep punched out wound on the right dorsal foot as well as the skin tears on the right posterior lateral calf and the left anterior lower leg. We have been using Hydrofera Blue under compression excellent results 10/12/2020 upon evaluation today patient appears to be doing decently well currently in regard to her foot ulcer. She does have a small hole still open with some tunneling that really goes proximally in the foot at around the 12:00 location. There is really not significant undermining other locations. This appears to be a small area of where there was a little fluid trapped. Nonetheless I do feel like this is still doing quite well and in general I think that she is going to benefit from possibly packing into this area with endoform in order to keep the area open while it granulates in. 11/09/2020 upon evaluation today patient appears to be doing really about the same in regard to her wound. There is a little reopening towards the distal portion of the wound bed but again I think this is just more as a result of the dressing possibly getting stuck even to little bit of the scar tissue nonetheless it also anything looks worse although I am not sure the endoform is really doing the job for her. I think we may want to try something a little different here I think packing with a silver alginate dressing may be optimal as compared to what we tried at this point. Is been [name], Sarah T. ([id number]) almost a month since have seen her and we really have not seen dramatic improvement. 12/07/2020 upon evaluation today patient appears to be doing well with regard to the dorsal surface of her foot. Overall I feel like this looks to be completely healed today and this is excellent news. There is no evidence of active infection which is great news. No fevers, chills, nausea, vomiting, or  diarrhea. 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 We managed to heal her out. As I remember she had severe bilateral lower extremity edema secondary to acute on chronic renal failure. She arrives in clinic this time with a wound on her distal left dorsal foot that is been there for about 6 months. Completely eschar covered she has been treating this with Neosporin. She said she is on IV vancomycin and cefepime at dialysis although we have no independent verification of this. This is due to be finished on 01/30/2022. She says it was for this foot but she does not say that she has had x-rays and MRI etc. Past medical history is extensive; she has type 2 diabetes although not currently in any treatment, end-stage renal disease on dialysis Monday Wednesday and Friday, coronary artery disease status post CABG, combined systolic and diastolic heart failure, peripheral vascular disease, cerebrovascular disease. Carotid artery stenosis, COPD, breast cancer ABI in our clinic was 0.79. I cannot find that she has had previous noninvasive arterial studies. She is only able to walk around the house does not describe claudication that I can elicit Upon inspection patient's wounds actually appear to be doing decently well. On the left foot this is mainly dry drainage, bleeding, and Iodoflex which is stuck on the surface of the wound I was able to get that off mechanically she did not want any sharp debridement whatsoever fortunately is able to get it off mechanically without using that just using saline and gauze. Subsequently I think this is otherwise doing quite well. On the right leg she does have swelling and she had a blister that popped up this feels like it almost completely sealed down but it still weeping  and leaking a little bit I think we need to manage that as well to be honest. 02-14-2022 upon evaluation today patient appears to be doing well currently in regard to the wound on her foot. She has been tolerating the dressing changes there is a little bit of film buildup here in the patient last week was very resistant to debridement because her ABIs were somewhat low I was avoiding that as well. However she did see Dr. Lazaro Arms and he apparently told her that her ABIs were good everything appeared to be fine and to be honest I think this is excellent news. I am very pleased to hear this and I do believe doing some sharp debridement would be helpful at this point as well. 02-21-2022 upon evaluation today patient appears to be doing well with regard to her wounds. Both are showing signs of improvement this is great news. Fortunately I do not see any evidence of active infection locally or systemically at this time which is great news. No fevers, chills, nausea, vomiting, or diarrhea. 03-02-2022 upon evaluation today patient appears to be doing better in regard to her right leg which appears to be healed. In regards to her left leg this on the foot at the top still has a small opening at this time. Fortunately there does not appear to be any signs of infection. With that being said I do think we may want to switch over to collagen based dressing to see if we get this to feeling a little bit better. 03-09-2022 upon evaluation today patient appears to be doing well currently in regard to her wound on the top of her left foot. I am  very pleased with where things stand and I think we are headed in the right direction here. She is still having a lot of trouble with the compression wrap. She is doing extremely well on the right side with the Tubigrip so I think we can probably switch over to using the Tubigrip on the left side as well especially since the collagen is getting so dry I do not believe this is doing her as  much good as I would like for her to to be honest. 03-23-2022 upon evaluation today patient appears to be doing well with regard to her foot ulcer. This is pretty much healed at this point. Fortunately I see no signs of infection which is great news. The biggest issue I think is good to be keeping her edema under control. The Tubigrip is not very tight but unfortunately rolls down the cuts and on the top of her leg I think she needs something to try to help keep this under better control more evenly she has done well she tells me in the past with an Ace wrap I think we can probably give that a shot. Readmission: 05-23-2022 upon evaluation today patient presents for reevaluation here in the clinic she was last seen June 23, 2022. At that time we got her compression wraps with juxta lites. Unfortunately she did not wear these appropriately she tells me that they were unable to get them on. I am not really sure why that is and to be peripherally honest I think that this to some degree is just a matter of they let her legs get too swollen and out-of-control and then subsequently ended up with it getting to the point that the juxta lites will be been fit. Either way based on what I am seeing at this time I do believe that the patient has been required compression here in the office to get this under control I think the 3 layer compression wraps probably the right way to go she is not going to tolerate anything too tight and the 4-layer I think would be much too tight for her. Patient's past medical history really has not changed she does have hypertension, end-stage renal disease, and she is on renal dialysis. Unfortunately they are not able to remove so much fluid as it throws her entire electrolyte imbalance that subsequently causes extreme cramping. 05-30-2022 upon evaluation today patient appears to be doing about the same in regard to her wounds. She needs to have some compression but she took off  the 3 layer compression wrap she tells me that it was about to make a fall several times and she could not continue to deal with that. She is states that there is no way she can afford to break anything which I completely understand. With that being said I do believe that the patient should likely have some type of compression on she told me that "she is not here for me to fix her fluid problems she is here for me to fix her wounds." With that being said I explained to the patient that there is not any way to do 1 without the other because the fluid is exacerbating the wound. She did not have much to say that to be honest. [name], Sarah T. ([id number]) Objective Constitutional Well-nourished and well-hydrated in no acute distress. Vitals Time Taken: 3:32 PM, Height: 64 in, Weight: 166 lbs, BMI: 28.5, Temperature: 98 F, Pulse: 71 bpm, Respiratory Rate: 18 breaths/min, Blood Pressure: 150/71 mmHg. Respiratory  normal breathing without difficulty. Psychiatric this patient is able to make decisions and demonstrates good insight into disease process. Alert and Oriented x 3. pleasant and cooperative. General Notes: Upon inspection patient's wounds again are showing signs of a little bit improvement on the left side. With that being said on the right side this is still quite deep. There is also a lot of drainage from both locations she really needs some compression. Integumentary (Hair, Skin) Wound #7 status is Open. Original cause of wound was Trauma. The date acquired was: 04/19/2022. The wound has been in treatment 1 weeks. The wound is located on the Right,Medial Lower Leg. The wound measures 4cm length x 4.3cm width x 0.2cm depth; 13.509cm^2 area and 2.702cm^3 volume. There is no tunneling or undermining noted. There is a medium amount of serosanguineous drainage noted. There is no granulation within the wound bed. There is a large (67-100%) amount of necrotic tissue within the wound bed  including Adherent Slough. Wound #8 status is Open. Original cause of wound was Trauma. The date acquired was: 04/24/2022. The wound has been in treatment 1 weeks. The wound is located on the Left,Lateral Lower Leg. The wound measures 1.5cm length x 2cm width x 0.1cm depth; 2.356cm^2 area and 0.236cm^3 volume. There is Fat Layer (Subcutaneous Tissue) exposed. There is no tunneling or undermining noted. There is a medium amount of serosanguineous drainage noted. There is large (67-100%) red granulation within the wound bed. There is a small (1-33%) amount of necrotic tissue within the wound bed including Adherent Slough. Assessment Active Problems ICD-10 Chronic venous hypertension (idiopathic) with ulcer and inflammation of bilateral lower extremity Non-pressure chronic ulcer of other part of right lower leg with fat layer exposed Non-pressure chronic ulcer of other part of left lower leg with fat layer exposed Essential (primary) hypertension Dependence on renal dialysis Hypertensive chronic kidney disease with stage 5 chronic kidney disease or end stage renal disease Plan Follow-up Appointments: Return Appointment in 1 week. Bathing/ Shower/ Hygiene: May shower with wound dressing protected with water repellent cover or cast protector. Anesthetic (Use 'Patient Medications' Section for Anesthetic Order Entry): Lidocaine applied to wound bed Edema Control - Lymphedema / Segmental Compressive Device / Other: Optional: One layer of unna paste to top of compression wrap (to act as an anchor). Tubigrip single layer applied. - size D Elevate, Exercise Daily and Avoid Standing for Long Periods of Time. Elevate legs to the level of the heart and pump ankles as often as possible Elevate leg(s) parallel to the floor when sitting. WOUND #7: - Lower Leg Wound Laterality: Right, Medial Cleanser: Byram Ancillary Kit - 15 Day Supply (DME) (Generic) 3 x Per Week/30 Days Discharge Instructions: Use  supplies as instructed; Kit contains: (15) Saline Bullets; (15) 3x3 Gauze; 15 pr Gloves Cleanser: Soap and Water 3 x Per Week/30 Days Discharge Instructions: Gently cleanse wound with antibacterial soap, rinse and pat dry prior to dressing wounds Cleanser: Wound Cleanser 3 x Per Week/30 Days Discharge Instructions: Wash your hands with soap and water. Remove old dressing, discard into plastic bag and place into trash. [name], Sarah T. ([id number]) Cleanse the wound with Wound Cleanser prior to applying a clean dressing using gauze sponges, not tissues or cotton balls. Do not scrub or use excessive force. Pat dry using gauze sponges, not tissue or cotton balls. Primary Dressing: Silvercel 4 1/4x 4 1/4 (in/in) (DME) (Generic) 3 x Per Week/30 Days Discharge Instructions: Apply Silvercel 4 1/4x 4 1/4 (in/in) as instructed Secondary Dressing:  Gauze (DME) (Generic) 3 x Per Week/30 Days Discharge Instructions: As directed: dry, moistened with saline or moistened with Dakins Solution Secured With: Tubigrip Size D, 3x10 (in/yd) 3 x Per Week/30 Days WOUND #8: - Lower Leg Wound Laterality: Left, Lateral Cleanser: Byram Ancillary Kit - 15 Day Supply (DME) (Generic) 3 x Per Week/30 Days Discharge Instructions: Use supplies as instructed; Kit contains: (15) Saline Bullets; (15) 3x3 Gauze; 15 pr Gloves Cleanser: Soap and Water 3 x Per Week/30 Days Discharge Instructions: Gently cleanse wound with antibacterial soap, rinse and pat dry prior to dressing wounds Cleanser: Wound Cleanser 3 x Per Week/30 Days Discharge Instructions: Wash your hands with soap and water. Remove old dressing, discard into plastic bag and place into trash. Cleanse the wound with Wound Cleanser prior to applying a clean dressing using gauze sponges, not tissues or cotton balls. Do not scrub or use excessive force. Pat dry using gauze sponges, not tissue or cotton balls. Primary Dressing: Silvercel 4 1/4x 4 1/4 (in/in) (DME) (Generic)  3 x Per Week/30 Days Discharge Instructions: Apply Silvercel 4 1/4x 4 1/4 (in/in) as instructed Secondary Dressing: Gauze (DME) (Generic) 3 x Per Week/30 Days Discharge Instructions: As directed: dry, moistened with saline or moistened with Dakins Solution Secured With: Tubigrip Size D, 3x10 (in/yd) 3 x Per Week/30 Days 1. I am good recommend that we go ahead and continue with the recommendation for wound care measures as before with regard to the silver alginate I think this is still a good option. 2. Also can recommend that we have the patient use Tubigrip size D for compression which I think will be a better way to go right now. Again I do not want her to fall and she does not need to wear the 3 layer compression wrap therefore I think the Tubigrip is her best way to go. 3. I am also can recommend that we have the patient continue to try to elevate her legs much as possible to help with edema control. Obviously the more of this she can do the better in my opinion. We will see patient back for reevaluation in 1 week here in the clinic. If anything worsens or changes patient will contact our office for additional recommendations. Electronic Signature(s) Signed: 05/30/2022 4:09:08 PM By: Worthy Keeler PA-C Entered By: Worthy Keeler on 05/30/2022 16:09:08 [name] ([id number]) -------------------------------------------------------------------------------- SuperBill Details [name] Date of Service: 05/30/2022 Medical Record Number: [id number] Patient Account Number: [id number] Date of Birth/Sex: 11-28-1933 (87 y.o. F) Treating RN: Carlene Coria Primary Care Provider: Fulton Reek Other Clinician: Referring Provider: Fulton Reek Treating Provider/Extender: Skipper Cliche in Treatment: 1 Diagnosis Coding ICD-10 Codes Code Description [id number] Chronic venous hypertension (idiopathic) with ulcer and inflammation of bilateral lower  extremity L97.812 Non-pressure chronic ulcer of other part of right lower leg with fat layer exposed L97.822 Non-pressure chronic ulcer of other part of left lower leg with fat layer exposed I10 Essential (primary) hypertension Z99.2 Dependence on renal dialysis I12.0 Hypertensive chronic kidney disease with stage 5 chronic kidney disease or end stage renal disease Facility Procedures CPT4 Code: [id number] Description: 99213 - WOUND CARE VISIT-LEV 3 EST PT Modifier: Quantity: 1 Physician Procedures CPT4 Code Description: [id number] - WC PHYS LEVEL 3 - EST PT Modifier: Quantity: 1 CPT4 Code Description: ICD-10 Diagnosis Description I87.333 Chronic venous hypertension (idiopathic) with ulcer and inflammation of L97.812 Non-pressure chronic ulcer of other part of right lower leg with fat lay L97.822 Non-pressure chronic  ulcer of  other part of left lower leg with fat laye I10 Essential (primary) hypertension Modifier: bilateral lower extre er exposed r exposed Quantity: mity Electronic Signature(s) Signed: 05/30/2022 4:14:01 PM By: Worthy Keeler PA-C Entered By: Worthy Keeler on 05/30/2022 16:14:01

## 2022-05-30 NOTE — Progress Notes (Addendum)
REATA, PETROV ([id number]) Visit Report for 05/30/2022 Arrival Information Details [name], [name] Date of Service: 05/30/2022 3:30 PM Medical Record Number: [id number] Patient Account Number: [id number] Date of Birth/Sex: 15-Jun-1934 (88 y.o. F) Treating RN: Carlene Coria Primary Care Provider: Fulton Reek Other Clinician: Referring Provider: Fulton Reek Treating Provider/Extender: Skipper Cliche in Treatment: 1 Visit Information History Since Last Visit All ordered tests and consults were completed: No Patient Arrived: Wheel Chair Added or deleted any medications: No Arrival Time: 15:26 Any new allergies or adverse reactions: No Accompanied By: husband Had a fall or experienced change in No Transfer Assistance: None activities of daily living that may affect Patient Identification Verified: Yes risk of falls: Secondary Verification Process Completed: Yes Signs or symptoms of abuse/neglect since last visito No Patient Requires Transmission-Based No Hospitalized since last visit: No Precautions: Implantable device outside of the clinic excluding No Patient Has Alerts: Yes cellular tissue based products placed in the center Patient Alerts: Patient on Blood since last visit: Thinner Has Dressing in Place as Prescribed: No DIABETIC Has Compression in Place as Prescribed: No ABI LEFT .79 02/14/22 ABI RIGHT 1.02 Pain Present Now: No 02/14/22 Electronic Signature(s) Signed: 06/01/2022 8:44:03 PM By: Carlene Coria RN Entered By: Carlene Coria on 05/30/2022 15:31:27 [name] ([id number]) -------------------------------------------------------------------------------- Clinic Level of Care Assessment Details [name] Date of Service: 05/30/2022 3:30 PM Medical Record Number: [id number] Patient Account Number: [id number] Date of Birth/Sex: 08-Dec-1933 (87 y.o. F) Treating RN: Carlene Coria Primary Care Provider: Fulton Reek Other Clinician: Referring Provider: Fulton Reek Treating Provider/Extender: Skipper Cliche in Treatment: 1 Clinic Level of Care Assessment Items TOOL 4 Quantity Score X - Use when only an EandM is performed on FOLLOW-UP visit 1 0 ASSESSMENTS - Nursing Assessment / Reassessment X - Reassessment of Co-morbidities (includes updates in patient status) 1 10 X- 1 5 Reassessment of Adherence to Treatment Plan ASSESSMENTS - Wound and Skin Assessment / Reassessment [] - Simple Wound Assessment / Reassessment - one wound 0 X- 2 5 Complex Wound Assessment / Reassessment - multiple wounds [] - 0 Dermatologic / Skin Assessment (not related to wound area) ASSESSMENTS - Focused Assessment [] - Circumferential Edema Measurements - multi extremities 0 [] - 0 Nutritional Assessment / Counseling / Intervention [] - 0 Lower Extremity Assessment (monofilament, tuning fork, pulses) [] - 0 Peripheral Arterial Disease Assessment (using hand held doppler) ASSESSMENTS - Ostomy and/or Continence Assessment and Care [] - Incontinence Assessment and Management 0 [] - 0 Ostomy Care Assessment and Management (repouching, etc.) PROCESS - Coordination of Care X - Simple Patient / Family Education for ongoing care 1 15 [] - 0 Complex (extensive) Patient / Family Education for ongoing care [] - 0 Staff obtains Programmer, systems, Records, Test Results / Process Orders [] - 0 Staff telephones HHA, Nursing Homes / Clarify orders / etc [] - 0 Routine Transfer to another Facility (non-emergent condition) [] - 0 Routine Hospital Admission (non-emergent condition) [] - 0 New Admissions / Biomedical engineer / Ordering NPWT, Apligraf, etc. [] - 0 Emergency Hospital Admission (emergent condition) X- 1 10 Simple Discharge Coordination [] - 0 Complex (extensive) Discharge Coordination PROCESS - Special Needs [] - Pediatric / Minor Patient Management 0 [] - 0 Isolation Patient Management [] -  0 Hearing / Language / Visual special needs [] - 0 Assessment of Community assistance (transportation, D/C planning, etc.) [] - 0 Additional assistance / Altered mentation [] - 0 Support Surface(s)  Assessment (bed, cushion, seat, etc.) INTERVENTIONS - Wound Cleansing / Measurement Beber, Jazzmine T. ([id number]) [] - 0 Simple Wound Cleansing - one wound X- 2 5 Complex Wound Cleansing - multiple wounds X- 1 5 Wound Imaging (photographs - any number of wounds) [] - 0 Wound Tracing (instead of photographs) [] - 0 Simple Wound Measurement - one wound X- 2 5 Complex Wound Measurement - multiple wounds INTERVENTIONS - Wound Dressings X - Small Wound Dressing one or multiple wounds 2 10 [] - 0 Medium Wound Dressing one or multiple wounds [] - 0 Large Wound Dressing one or multiple wounds [] - 0 Application of Medications - topical [] - 0 Application of Medications - injection INTERVENTIONS - Miscellaneous [] - External ear exam 0 [] - 0 Specimen Collection (cultures, biopsies, blood, body fluids, etc.) [] - 0 Specimen(s) / Culture(s) sent or taken to Lab for analysis [] - 0 Patient Transfer (multiple staff / Civil Service fast streamer / Similar devices) [] - 0 Simple Staple / Suture removal (25 or less) [] - 0 Complex Staple / Suture removal (26 or more) [] - 0 Hypo / Hyperglycemic Management (close monitor of Blood Glucose) [] - 0 Ankle / Brachial Index (ABI) - do not check if billed separately X- 1 5 Vital Signs Has the patient been seen at the hospital within the last three years: Yes Total Score: 100 Level Of Care: New/Established - Level 3 Electronic Signature(s) Signed: 06/01/2022 8:44:03 PM By: Carlene Coria RN Entered By: Carlene Coria on 05/30/2022 15:55:45 [name] ([id number]) -------------------------------------------------------------------------------- Encounter Discharge Information Details [name] Date of Service: 05/30/2022 3:30  PM Medical Record Number: [id number] Patient Account Number: [id number] Date of Birth/Sex: 08-31-1934 (88 y.o. F) Treating RN: Carlene Coria Primary Care Provider: Fulton Reek Other Clinician: Referring Provider: Fulton Reek Treating Provider/Extender: Skipper Cliche in Treatment: 1 Encounter Discharge Information Items Discharge Condition: Stable Ambulatory Status: Wheelchair Discharge Destination: Home Transportation: Private Auto Accompanied By: husband Schedule Follow-up Appointment: Yes Clinical Summary of Care: Electronic Signature(s) Signed: 06/01/2022 8:44:03 PM By: Carlene Coria RN Entered By: Carlene Coria on 05/30/2022 15:56:39 [name] ([id number]) -------------------------------------------------------------------------------- Lower Extremity Assessment Details [name] Date of Service: 05/30/2022 3:30 PM Medical Record Number: [id number] Patient Account Number: [id number] Date of Birth/Sex: 26-Sep-1934 (87 y.o. F) Treating RN: Carlene Coria Primary Care Provider: Fulton Reek Other Clinician: Referring Provider: Fulton Reek Treating Provider/Extender: Skipper Cliche in Treatment: 1 Edema Assessment Assessed: [Left: No] [Right: No] [Left: Edema] [Right: :] Calf Left: Right: Point of Measurement: 32 cm From Medial Instep 42 cm 43 cm Ankle Left: Right: Point of Measurement: 10 cm From Medial Instep 28 cm 27 cm Knee To Floor Left: Right: From Medial Instep 42 cm 42 cm Vascular Assessment Pulses: Dorsalis Pedis Palpable: [Left:Yes] [Right:Yes] Electronic Signature(s) Signed: 06/01/2022 8:44:03 PM By: Carlene Coria RN Entered By: Carlene Coria on 05/30/2022 15:40:02 [name] ([id number]) -------------------------------------------------------------------------------- Multi Wound Chart Details [name] Date of Service: 05/30/2022 3:30 PM Medical Record Number: [id number] Patient  Account Number: [id number] Date of Birth/Sex: 1933/12/28 (88 y.o. 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 Lower N/A Extremity Extremity Comorbid History: Congestive Heart Failure, Congestive Heart Failure, N/A Hypertension, Type II Diabetes, Hypertension, Type II Diabetes, Received Radiation Received Radiation Date Acquired: 04/19/2022 04/24/2022 N/A Weeks of Treatment: 1 1 N/A Wound Status: Open Open N/A Wound Recurrence: No No N/A Measurements L x W x D (cm) 4x4.3x0.2 1.5x2x0.1 N/A Area (cm) : 13.509 2.356 N/A Volume (cm) : 2.702 0.236 N/A % Reduction in Area: -38.70% 0.00% N/A % Reduction in Volume: -38.70% 0.00% N/A Classification: Grade 2 Grade 2 N/A Exudate Amount: Medium Medium N/A Exudate Type: Serosanguineous Serosanguineous N/A Exudate Color: red, brown red, brown N/A Granulation Amount: None Present (0%) Large (67-100%) N/A Granulation Quality: N/A Red N/A Necrotic Amount: Large (67-100%) Small (1-33%) N/A Exposed Structures: Fascia: No Fat Layer (Subcutaneous Tissue): N/A Fat Layer (Subcutaneous Tissue): Yes No Fascia: No Tendon: No Tendon: No Muscle: No Muscle: No Joint: No Joint: No Bone: No Bone: No Epithelialization: None None N/A Treatment Notes Electronic Signature(s) Signed: 06/01/2022 8:44:03 PM By: Carlene Coria RN Entered By: Carlene Coria on 05/30/2022 15:40:13 [name]  ([id number]) -------------------------------------------------------------------------------- Multi-Disciplinary Care Plan Details [name] Date of Service: 05/30/2022 3:30 PM Medical Record Number: [id number] Patient Account Number: [id number] Date of Birth/Sex: [birth year] (87 y.o. F) Treating RN: Carlene Coria Primary Care Fleur Audino: Fulton Reek Other Clinician: Referring Suyash Amory: Fulton Reek Treating Ketzia Guzek/Extender: Skipper Cliche in Treatment: 1 Active Inactive Nutrition Nursing Diagnoses: Impaired glucose control: actual or potential Goals: Patient/caregiver will maintain therapeutic glucose control Date Initiated: 05/23/2022 Target Resolution Date: 06/23/2022 Goal Status: Active Interventions: Assess HgA1c results as ordered upon admission and as needed Assess patient nutrition upon admission and as needed per policy Notes: Wound/Skin Impairment Nursing Diagnoses: Knowledge deficit related to ulceration/compromised skin integrity Goals: Patient/caregiver will verbalize understanding of skin care regimen Date Initiated: 05/23/2022 Target Resolution Date: 06/23/2022 Goal Status: Active Ulcer/skin breakdown will have a volume reduction of 30% by week 4 Date Initiated: 05/23/2022 Target Resolution Date: 06/23/2022 Goal Status: Active Ulcer/skin breakdown will have a volume reduction of 50% by week 8 Date Initiated: 05/23/2022 Target Resolution Date: 07/23/2022 Goal Status: Active Ulcer/skin breakdown will have a volume reduction of 80% by week 12 Date Initiated: 05/23/2022 Target Resolution Date: 08/23/2022 Goal Status: Active Ulcer/skin breakdown will heal within 14 weeks Date Initiated: 05/23/2022 Target Resolution Date: 09/22/2022 Goal Status: Active Interventions: Assess patient/caregiver ability to obtain necessary supplies Assess patient/caregiver ability to perform ulcer/skin care regimen upon admission and as needed Assess  ulceration(s) every visit Notes: Electronic Signature(s) Signed: 06/01/2022 8:44:03 PM By: Carlene Coria RN Entered By: Carlene Coria on 05/30/2022 15:40:06 [name] ([id number]) -------------------------------------------------------------------------------- Pain Assessment Details [name] Date of Service: 05/30/2022 3:30 PM Medical Record Number: [id number] Patient Account Number: [id number] Date of Birth/Sex: 05/06/1934 (88 y.o. F) Treating RN: Carlene Coria Primary Care Paco Cislo: Fulton Reek Other Clinician: Referring Clydell Sposito: Fulton Reek Treating Arretta Toenjes/Extender: Skipper Cliche in Treatment: 1 Active Problems Location of Pain Severity and Description of Pain Patient Has Paino No Site Locations Pain Management and Medication Current Pain Management: Electronic Signature(s) Signed: 06/01/2022 8:44:03 PM By: Carlene Coria RN Entered By: Carlene Coria on 05/30/2022 15:32:24 [name] ([id number]) -------------------------------------------------------------------------------- Patient/Caregiver Education Details [name] Date of Service: 05/30/2022 3:30 PM Medical Record Number: [id number] Patient Account Number: [id number] Date of Birth/Gender: 04-18-1934 (87 y.o. F) Treating RN: Carlene Coria Primary Care Physician: Fulton Reek Other Clinician: Referring Physician:  Fulton Reek Treating Physician/Extender: Skipper Cliche in Treatment: 1 Education Assessment Education Provided To: Patient Education Topics Provided Wound/Skin Impairment: Methods: Explain/Verbal Responses: State content correctly Electronic Signature(s) Signed: 06/01/2022 8:44:03 PM By: Carlene Coria RN Entered By: Carlene Coria on 05/30/2022 15:55:59 [name] ([id number]) -------------------------------------------------------------------------------- Wound Assessment Details [name] Date of Service: 05/30/2022 3:30 PM Medical Record Number: [id number] Patient Account Number: [id number] Date of Birth/Sex: 1934-01-01 (87 y.o. F) Treating RN: Carlene Coria Primary Care Joban Colledge: Fulton Reek Other Clinician: Referring Oluchi Pucci: Fulton Reek Treating Kenzi Bardwell/Extender: Skipper Cliche in Treatment: 1 Wound Status Wound Number: 7 Primary Diabetic Wound/Ulcer of the Lower Extremity Etiology: Wound Location: Right, Medial Lower Leg Wound Status: Open Wounding Event: Trauma Comorbid Congestive Heart Failure, Hypertension, Type II Date Acquired: 04/19/2022 History: Diabetes, Received Radiation Weeks Of Treatment: 1 Clustered Wound: No Photos Wound Measurements Length: (cm) 4 Width: (cm) 4.3 Depth: (cm) 0.2 Area: (cm) 13.509 Volume: (cm) 2.702 % Reduction in Area: -38.7% % Reduction in Volume: -38.7% Epithelialization: None Tunneling: No Undermining: No Wound Description Classification: Grade 2 Exudate Amount: Medium Exudate Type: Serosanguineous Exudate Color: red, brown Foul Odor After Cleansing: No Slough/Fibrino Yes Wound Bed Granulation Amount: None Present (0%) Exposed Structure Necrotic Amount: Large (67-100%) Fascia Exposed: No Necrotic Quality: Adherent Slough Fat Layer (Subcutaneous Tissue) Exposed: No Tendon Exposed: No Muscle Exposed: No Joint Exposed: No Bone Exposed: No Treatment Notes Wound #7 (Lower Leg) Wound Laterality: Right, Medial Cleanser Byram Ancillary Kit - 15 Day Supply Discharge Instruction: Use supplies as instructed; Kit contains: (15) Saline Bullets; (15) 3x3 Gauze; 15 pr Gloves Soap and Water Discharge Instruction: Gently cleanse wound with antibacterial soap, rinse and pat dry prior to dressing wounds Lemire, Shela T. ([id number]) Wound Cleanser Discharge Instruction: Wash your hands with soap and water. Remove old dressing, discard into plastic bag and place into trash. Cleanse the wound with Wound  Cleanser prior to applying a clean dressing using gauze sponges, not tissues or cotton balls. Do not scrub or use excessive force. Pat dry using gauze sponges, not tissue or cotton balls. Peri-Wound Care Topical Primary Dressing Silvercel 4 1/4x 4 1/4 (in/in) Discharge Instruction: Apply Silvercel 4 1/4x 4 1/4 (in/in) as instructed Secondary Dressing Gauze Discharge Instruction: As directed: dry, moistened with saline or moistened with Dakins Solution Secured With Tubigrip Size D, 3x10 (in/yd) Compression Wrap Compression Stockings Add-Ons Electronic Signature(s) Signed: 06/01/2022 8:44:03 PM By: Carlene Coria RN Entered By: Carlene Coria on 05/30/2022 15:38:22 [name] ([id number]) -------------------------------------------------------------------------------- Wound Assessment Details [name] Date of Service: 05/30/2022 3:30 PM Medical Record Number: [id number] Patient Account Number: [id number] Date of Birth/Sex: 1934-03-25 (87 y.o. F) Treating RN: Carlene Coria Primary Care Diany Formosa: Fulton Reek Other Clinician: Referring Jedadiah Abdallah: Fulton Reek Treating Eual Lindstrom/Extender: Skipper Cliche in Treatment: 1 Wound Status Wound Number: 8 Primary Diabetic Wound/Ulcer of the Lower Extremity Etiology: Wound Location: Left, Lateral Lower Leg Wound Status: Open Wounding Event: Trauma Comorbid Congestive Heart Failure, Hypertension, Type II Date Acquired: 04/24/2022 History: Diabetes, Received Radiation Weeks Of Treatment: 1 Clustered Wound: No Photos Wound Measurements Length: (cm) 1.5 Width: (cm) 2 Depth: (cm) 0.1 Area: (cm) 2.356 Volume: (cm) 0.236 % Reduction in Area: 0% % Reduction in Volume: 0% Epithelialization: None Tunneling: No Undermining: No Wound Description Classification: Grade 2 Exudate Amount: Medium Exudate Type: Serosanguineous Exudate Color: red, brown Foul Odor After Cleansing: No Slough/Fibrino Yes Wound  Bed Granulation Amount: Large (67-100%) Exposed Structure Granulation Quality: Red Fascia Exposed:  No Necrotic Amount: Small (1-33%) Fat Layer (Subcutaneous Tissue) Exposed: Yes Necrotic Quality: Adherent Slough Tendon Exposed: No Muscle Exposed: No Joint Exposed: No Bone Exposed: No Treatment Notes Wound #8 (Lower Leg) Wound Laterality: Left, Lateral Cleanser Byram Ancillary Kit - 15 Day Supply Discharge Instruction: Use supplies as instructed; Kit contains: (15) Saline Bullets; (15) 3x3 Gauze; 15 pr Gloves Soap and Water Discharge Instruction: Gently cleanse wound with antibacterial soap, rinse and pat dry prior to dressing wounds Novell, Milena TMarland Kitchen ([id number]) Wound Cleanser Discharge Instruction: Wash your hands with soap and water. Remove old dressing, discard into plastic bag and place into trash. Cleanse the wound with Wound Cleanser prior to applying a clean dressing using gauze sponges, not tissues or cotton balls. Do not scrub or use excessive force. Pat dry using gauze sponges, not tissue or cotton balls. Peri-Wound Care Topical Primary Dressing Silvercel 4 1/4x 4 1/4 (in/in) Discharge Instruction: Apply Silvercel 4 1/4x 4 1/4 (in/in) as instructed Secondary Dressing Gauze Discharge Instruction: As directed: dry, moistened with saline or moistened with Dakins Solution Secured With Tubigrip Size D, 3x10 (in/yd) Compression Wrap Compression Stockings Add-Ons Electronic Signature(s) Signed: 06/01/2022 8:44:03 PM By: Carlene Coria RN Entered By: Carlene Coria on 05/30/2022 15:38:36 [name] ([id number]) -------------------------------------------------------------------------------- Vitals Details [name] Date of Service: 05/30/2022 3:30 PM Medical Record Number: [id number] Patient Account Number: [id number] Date of Birth/Sex: 1933/10/26 (88 y.o. F) Treating RN: Carlene Coria Primary Care Provider: Fulton Reek Other  Clinician: Referring Provider: Fulton Reek Treating Provider/Extender: Skipper Cliche in Treatment: 1 Vital Signs Time Taken: 15:32 Temperature (F): 98 Height (in): 64 Pulse (bpm): 71 Weight (lbs): 166 Respiratory Rate (breaths/min): 18 Body Mass Index (BMI): 28.5 Blood Pressure (mmHg): 150/71 Reference Range: 80 - 120 mg / dl Electronic Signature(s) Signed: 06/01/2022 8:44:03 PM By: Carlene Coria RN Entered By: Carlene Coria on 05/30/2022 15:32:18

## 2022-06-01 ENCOUNTER — Ambulatory Visit: Payer: Medicare Other | Admitting: Physician Assistant

## 2022-06-01 ENCOUNTER — Ambulatory Visit
Admission: RE | Admit: 2022-06-01 | Discharge: 2022-06-01 | Disposition: A | Discharge status: Home / Self Care | Payer: Medicare Other | Source: Ambulatory Visit | Attending: Cardiovascular Disease | Admitting: Cardiovascular Disease

## 2022-06-01 DIAGNOSIS — I25810 Atherosclerosis of coronary artery bypass graft(s) without angina pectoris: Secondary | ICD-10-CM | POA: Insufficient documentation

## 2022-06-01 LAB — NM MYOCAR MULTI W/SPECT W/WALL MOTION / EF
LV dias vol: 191 mL (ref 46–106)
LV sys vol: 116 mL
Nuc Stress EF: 39 %
Peak HR: 81 {beats}/min
Rest HR: 65 {beats}/min
Rest Nuclear Isotope Dose: 10.7 mCi
SDS: 3
SRS: 10
SSS: 11
ST Depression (mm): 0 mm
Stress Nuclear Isotope Dose: 30.8 mCi
TID: 0.99

## 2022-06-01 MED ORDER — TECHNETIUM TC 99M TETROFOSMIN IV KIT
10.0000 | PACK | Freq: Once | INTRAVENOUS | Status: AC
  Administered 2022-06-01: 10.66 via INTRAVENOUS

## 2022-06-01 MED ORDER — TECHNETIUM TC 99M TETROFOSMIN IV KIT
30.8200 | PACK | Freq: Once | INTRAVENOUS | Status: AC | PRN
  Administered 2022-06-01: 30.82 via INTRAVENOUS

## 2022-06-01 MED ORDER — REGADENOSON 0.4 MG/5ML IV SOLN
0.4000 mg | Freq: Once | INTRAVENOUS | Status: AC
  Administered 2022-06-01: 0.4 mg via INTRAVENOUS

## 2022-06-06 ENCOUNTER — Other Ambulatory Visit: Payer: Self-pay | Admitting: Cardiovascular Disease

## 2022-06-07 ENCOUNTER — Telehealth: Payer: Self-pay | Admitting: Cardiovascular Disease

## 2022-06-07 NOTE — Telephone Encounter (Signed)
The patient has been notified of the result and verbalized understanding.  All questions (if any) were answered. Alvis Lemmings, RN 06/07/2022 4:54 PM

## 2022-06-07 NOTE — Telephone Encounter (Signed)
Attempted to call the patient at her home #. No answer- No voice mail.   Attempted to call the cell #, which is her husbands, he advised he could not hear on that phone, but the patient is at HD now.  He asked that we call back to the home # at 1:00 pm.

## 2022-06-07 NOTE — Telephone Encounter (Signed)
Sarah Merritts, MD  06/06/2022 12:42 PM EDT     Stress test No significant ischemia noted Moderately reduced ejection fraction 34%, also seen previously on echocardiogram No indication for cardiac catheterization at this time

## 2022-06-08 ENCOUNTER — Encounter: Payer: Medicare Other | Attending: Physician Assistant | Admitting: Physician Assistant

## 2022-06-08 DIAGNOSIS — I132 Hypertensive heart and chronic kidney disease with heart failure and with stage 5 chronic kidney disease, or end stage renal disease: Secondary | ICD-10-CM | POA: Insufficient documentation

## 2022-06-08 DIAGNOSIS — Z6828 Body mass index (BMI) 28.0-28.9, adult: Secondary | ICD-10-CM | POA: Diagnosis not present

## 2022-06-08 DIAGNOSIS — R768 Other specified abnormal immunological findings in serum: Secondary | ICD-10-CM | POA: Diagnosis not present

## 2022-06-08 DIAGNOSIS — I2510 Atherosclerotic heart disease of native coronary artery without angina pectoris: Secondary | ICD-10-CM | POA: Diagnosis not present

## 2022-06-08 DIAGNOSIS — E11621 Type 2 diabetes mellitus with foot ulcer: Secondary | ICD-10-CM | POA: Diagnosis not present

## 2022-06-08 DIAGNOSIS — L97822 Non-pressure chronic ulcer of other part of left lower leg with fat layer exposed: Secondary | ICD-10-CM | POA: Insufficient documentation

## 2022-06-08 DIAGNOSIS — N186 End stage renal disease: Secondary | ICD-10-CM | POA: Diagnosis not present

## 2022-06-08 DIAGNOSIS — Z992 Dependence on renal dialysis: Secondary | ICD-10-CM | POA: Insufficient documentation

## 2022-06-08 DIAGNOSIS — E669 Obesity, unspecified: Secondary | ICD-10-CM | POA: Insufficient documentation

## 2022-06-08 DIAGNOSIS — E1151 Type 2 diabetes mellitus with diabetic peripheral angiopathy without gangrene: Secondary | ICD-10-CM | POA: Insufficient documentation

## 2022-06-08 DIAGNOSIS — I87333 Chronic venous hypertension (idiopathic) with ulcer and inflammation of bilateral lower extremity: Secondary | ICD-10-CM | POA: Diagnosis not present

## 2022-06-08 DIAGNOSIS — L97812 Non-pressure chronic ulcer of other part of right lower leg with fat layer exposed: Secondary | ICD-10-CM | POA: Diagnosis present

## 2022-06-08 DIAGNOSIS — Z8673 Personal history of transient ischemic attack (TIA), and cerebral infarction without residual deficits: Secondary | ICD-10-CM | POA: Insufficient documentation

## 2022-06-08 DIAGNOSIS — E1122 Type 2 diabetes mellitus with diabetic chronic kidney disease: Secondary | ICD-10-CM | POA: Insufficient documentation

## 2022-06-08 NOTE — Progress Notes (Signed)
LUDELLA, PRANGER ([id number]) Visit Report for 06/08/2022 Chief Complaint Document Details [name], [name] Date of Service: 06/08/2022 10:15 AM Medical Record Number: [id number] Patient Account Number: [id number] Date of Birth/Sex: 10-06-1933 (88 y.o. F) Treating RN: Carlene Coria Primary Care Provider: Fulton Reek Other Clinician: Referring Provider: Fulton Reek Treating Provider/Extender: Skipper Cliche in Treatment: 2 Information Obtained from: Patient Chief Complaint Bilateral LE Ulcers Electronic Signature(s) Signed: 06/08/2022 11:01:00 AM By: Worthy Keeler PA-C Entered By: Worthy Keeler on 06/08/2022 11:01:00 [name] ([id number]) -------------------------------------------------------------------------------- Physician Orders Details Patient Name: [name] Date of Service: 06/08/2022 10:15 AM Medical Record Number: [id number] Patient Account Number: [id number] Date of Birth/Sex: [birth year] (87 y.o. F) Treating RN: Carlene Coria Primary Care Provider: Fulton Reek Other Clinician: Referring Provider: Fulton Reek Treating Provider/Extender: Skipper Cliche in Treatment: 2 Verbal / Phone Orders: No Diagnosis Coding Follow-up Appointments o Return Appointment in 1 week. Bathing/ Shower/ Hygiene o May shower with wound dressing protected with water repellent cover or cast protector. Anesthetic (Use 'Patient Medications' Section for Anesthetic Order Entry) o Lidocaine applied to wound bed Edema Control - Lymphedema / Segmental Compressive Device / Other o Optional: One layer of unna paste to top of compression wrap (to act as an anchor). o Tubigrip single layer applied. - size D o Elevate, Exercise Daily and Avoid Standing for Long Periods of Time. o Elevate legs to the level of the heart and pump ankles as often as possible o Elevate leg(s) parallel to the floor when sitting. Wound Treatment Wound #7 -  Lower Leg Wound Laterality: Right, Medial Cleanser: Byram Ancillary Kit - 15 Day Supply (Generic) 3 x Per Week/30 Days Discharge Instructions: Use supplies as instructed; Kit contains: (15) Saline Bullets; (15) 3x3 Gauze; 15 pr Gloves Cleanser: Soap and Water 3 x Per Week/30 Days Discharge Instructions: Gently cleanse wound with antibacterial soap, rinse and pat dry prior to dressing wounds Cleanser: Wound Cleanser 3 x Per Week/30 Days Discharge Instructions: Wash your hands with soap and water. Remove old dressing, discard into plastic bag and place into trash. Cleanse the wound with Wound Cleanser prior to applying a clean dressing using gauze sponges, not tissues or cotton balls. Do not scrub or use excessive force. Pat dry using gauze sponges, not tissue or cotton balls. Primary Dressing: Silvercel 4 1/4x 4 1/4 (in/in) (Generic) 3 x Per Week/30 Days Discharge Instructions: Apply Silvercel 4 1/4x 4 1/4 (in/in) as instructed Secondary Dressing: Gauze (Generic) 3 x Per Week/30 Days Discharge Instructions: As directed: dry, Secured With: Tubigrip Size D, 3x10 (in/yd) 3 x Per Week/30 Days Wound #8 - Lower Leg Wound Laterality: Left, Lateral Cleanser: Byram Ancillary Kit - 15 Day Supply (Generic) 3 x Per Week/30 Days Discharge Instructions: Use supplies as instructed; Kit contains: (15) Saline Bullets; (15) 3x3 Gauze; 15 pr Gloves Cleanser: Soap and Water 3 x Per Week/30 Days Discharge Instructions: Gently cleanse wound with antibacterial soap, rinse and pat dry prior to dressing wounds Cleanser: Wound Cleanser 3 x Per Week/30 Days Discharge Instructions: Wash your hands with soap and water. Remove old dressing, discard into plastic bag and place into trash. Cleanse the wound with Wound Cleanser prior to applying a clean dressing using gauze sponges, not tissues or cotton balls. Do not scrub or use excessive force. Pat dry using gauze sponges, not tissue or cotton balls. Primary Dressing:  Silvercel 4 1/4x 4 1/4 (in/in) (Generic) 3 x Per Week/30 Days Discharge Instructions: Apply Silvercel 4 1/4x  4 1/4 (in/in) as instructed Secondary Dressing: Gauze (Generic) 3 x Per Week/30 Days Discharge Instructions: As directed: dry, NYANA, HAREN. ([id number]) Secured With: Tubigrip Size D, 3x10 (in/yd) 3 x Per Week/30 Days Electronic Signature(s) Unsigned Entered ByCarlene Coria on 06/08/2022 11:17:53 Signature(s): Date(s): [name] ([id number]) -------------------------------------------------------------------------------- Problem List Details Patient Name: [name] Date of Service: 06/08/2022 10:15 AM Medical Record Number: [id number] Patient Account Number: [id number] Date of Birth/Sex: 1934/04/04 (87 y.o. F) Treating RN: Carlene Coria Primary Care Provider: Fulton Reek Other Clinician: Referring Provider: Fulton Reek Treating Provider/Extender: Skipper Cliche in Treatment: 2 Active Problems ICD-10 Encounter Code Description Active Date MDM Diagnosis I87.333 Chronic venous hypertension (idiopathic) with ulcer and inflammation of 05/23/2022 No Yes bilateral lower extremity L97.812 Non-pressure chronic ulcer of other part of right lower leg with fat layer 05/23/2022 No Yes exposed L97.822 Non-pressure chronic ulcer of other part of left lower leg with fat layer 05/23/2022 No Yes exposed [place] (primary) hypertension 05/23/2022 No Yes Z99.2 Dependence on renal dialysis 05/23/2022 No Yes 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

## 2022-06-12 NOTE — Progress Notes (Signed)
[name], [name] ([id number]) Visit Report for 06/08/2022 Arrival Information Details [name], [name] Date of Service: 06/08/2022 10:15 AM Medical Record Number: [id number] Patient Account Number: [id number] Date of Birth/Sex: 12-Mar-1934 (88 y.o. F) Treating RN: Carlene Coria Primary Care Tyheem Boughner: Fulton Reek Other Clinician: Referring Mela Perham: Fulton Reek Treating Samar Dass/Extender: Skipper Cliche in Treatment: 2 Visit Information History Since Last Visit All ordered tests and consults were completed: No Patient Arrived: Wheel Chair Added or deleted any medications: No Arrival Time: 10:19 Any new allergies or adverse reactions: No Accompanied By: husband Had a fall or experienced change in No Transfer Assistance: None activities of daily living that may affect Patient Identification Verified: Yes risk of falls: Secondary Verification Process Completed: Yes Signs or symptoms of abuse/neglect since last visito No Patient Requires Transmission-Based No Hospitalized since last visit: No Precautions: Implantable device outside of the clinic excluding No Patient Has Alerts: Yes cellular tissue based products placed in the center Patient Alerts: Patient on Blood since last visit: Thinner Has Dressing in Place as Prescribed: Yes DIABETIC Has Compression in Place as Prescribed: Yes ABI LEFT .79 02/14/22 ABI RIGHT 1.02 Pain Present Now: No 02/14/22 Electronic Signature(s) Signed: 06/12/2022 8:13:04 AM By: Carlene Coria RN Entered By: Carlene Coria on 06/08/2022 10:24:35 [name] ([id number]) -------------------------------------------------------------------------------- Clinic Level of Care Assessment Details [name] Date of Service: 06/08/2022 10:15 AM Medical Record Number: [id number] Patient Account Number: [id number] Date of Birth/Sex: [birth year] (87 y.o. F) Treating RN: Carlene Coria Primary Care Billye Nydam: Fulton Reek Other Clinician: Referring Aide Wojnar: Fulton Reek Treating Tanice Petre/Extender: Skipper Cliche in Treatment: 2 Clinic Level of Care Assessment Items TOOL 4 Quantity Score X - Use when only an EandM is performed on FOLLOW-UP visit 1 0 ASSESSMENTS - Nursing Assessment / Reassessment X - Reassessment of Co-morbidities (includes updates in patient status) 1 10 X- 1 5 Reassessment of Adherence to Treatment Plan ASSESSMENTS - Wound and Skin Assessment / Reassessment []  - Simple Wound Assessment / Reassessment - one wound 0 X- 2 5 Complex Wound Assessment / Reassessment - multiple wounds []  - 0 Dermatologic / Skin Assessment (not related to wound area) ASSESSMENTS - Focused Assessment []  - Circumferential Edema Measurements - multi extremities 0 []  - 0 Nutritional Assessment / Counseling / Intervention []  - 0 Lower Extremity Assessment (monofilament, tuning fork, pulses) []  - 0 Peripheral Arterial Disease Assessment (using hand held doppler) ASSESSMENTS - Ostomy and/or Continence Assessment and Care []  - Incontinence Assessment and Management 0 []  - 0 Ostomy Care Assessment and Management (repouching, etc.) PROCESS - Coordination of Care X - Simple Patient / Family Education for ongoing care 1 15 []  - 0 Complex (extensive) Patient / Family Education for ongoing care []  - 0 Staff obtains Programmer, systems, Records, Test Results / Process Orders []  - 0 Staff telephones HHA, Nursing Homes / Clarify orders / etc []  - 0 Routine Transfer to another Facility (non-emergent condition) []  - 0 Routine Hospital Admission (non-emergent condition) []  - 0 New Admissions / Biomedical engineer / Ordering NPWT, Apligraf, etc. []  - 0 Emergency Hospital Admission (emergent condition) X- 1 10 Simple Discharge Coordination []  - 0 Complex (extensive) Discharge Coordination PROCESS - Special Needs []  - Pediatric / Minor Patient Management 0 []  - 0 Isolation Patient Management []  -  0 Hearing / Language / Visual special needs []  - 0 Assessment of Community assistance (transportation, D/C planning, etc.) []  - 0 Additional assistance / Altered mentation []  - 0 Support Surface(s)  Assessment (bed, cushion, seat, etc.) INTERVENTIONS - Wound Cleansing / Measurement [name], Sarah T. ([id number]) []  - 0 Simple Wound Cleansing - one wound X- 2 5 Complex Wound Cleansing - multiple wounds X- 1 5 Wound Imaging (photographs - any number of wounds) []  - 0 Wound Tracing (instead of photographs) []  - 0 Simple Wound Measurement - one wound X- 2 5 Complex Wound Measurement - multiple wounds INTERVENTIONS - Wound Dressings X - Small Wound Dressing one or multiple wounds 2 10 []  - 0 Medium Wound Dressing one or multiple wounds []  - 0 Large Wound Dressing one or multiple wounds []  - 0 Application of Medications - topical []  - 0 Application of Medications - injection INTERVENTIONS - Miscellaneous []  - External ear exam 0 []  - 0 Specimen Collection (cultures, biopsies, blood, body fluids, etc.) []  - 0 Specimen(s) / Culture(s) sent or taken to Lab for analysis []  - 0 Patient Transfer (multiple staff / Civil Service fast streamer / Similar devices) []  - 0 Simple Staple / Suture removal (25 or less) []  - 0 Complex Staple / Suture removal (26 or more) []  - 0 Hypo / Hyperglycemic Management (close monitor of Blood Glucose) []  - 0 Ankle / Brachial Index (ABI) - do not check if billed separately X- 1 5 Vital Signs Has the patient been seen at the hospital within the last three years: Yes Total Score: 100 Level Of Care: New/Established - Level 3 Electronic Signature(s) Signed: 06/12/2022 8:13:04 AM By: Carlene Coria RN Entered By: Carlene Coria on 06/08/2022 14:04:27 [name] ([id number]) -------------------------------------------------------------------------------- Encounter Discharge Information Details [name] Date of Service: 06/08/2022 10:15  AM Medical Record Number: [id number] Patient Account Number: [id number] Date of Birth/Sex: 1934-02-20 (87 y.o. F) Treating RN: Carlene Coria Primary Care Courtni Balash: Fulton Reek Other Clinician: Referring Carmine Youngberg: Fulton Reek Treating Dona Klemann/Extender: Skipper Cliche in Treatment: 2 Encounter Discharge Information Items Discharge Condition: Stable Ambulatory Status: Wheelchair Discharge Destination: Home Transportation: Private Auto Accompanied By: husband Schedule Follow-up Appointment: Yes Clinical Summary of Care: Electronic Signature(s) Signed: 06/08/2022 2:05:35 PM By: Carlene Coria RN Entered By: Carlene Coria on 06/08/2022 14:05:34 [name] ([id number]) -------------------------------------------------------------------------------- Lower Extremity Assessment Details [name] Date of Service: 06/08/2022 10:15 AM Medical Record Number: [id number] Patient Account Number: [id number] Date of Birth/Sex: Oct 30, 1933 (87 y.o. F) Treating RN: Carlene Coria Primary Care Shaya Reddick: Fulton Reek Other Clinician: Referring Salvator Seppala: Fulton Reek Treating Kemani Heidel/Extender: Skipper Cliche in Treatment: 2 Edema Assessment Assessed: [Left: No] [Right: No] [Left: Edema] [Right: :] Calf Left: Right: Point of Measurement: 32 cm From Medial Instep 32 cm 38 cm Ankle Left: Right: Point of Measurement: 10 cm From Medial Instep 28 cm 27 cm Knee To Floor Left: Right: From Medial Instep 42 cm 42 cm Vascular Assessment Pulses: Dorsalis Pedis Palpable: [Left:Yes] [Right:Yes] Electronic Signature(s) Signed: 06/12/2022 8:13:04 AM By: Carlene Coria RN Entered By: Carlene Coria on 06/08/2022 10:34:19 [name] ([id number]) -------------------------------------------------------------------------------- Multi Wound Chart Details [name] Date of Service: 06/08/2022 10:15 AM Medical Record Number: [id number] Patient  Account Number: [id number] Date of Birth/Sex: [birth year] (87 y.o. 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] [8:Diabetic Wound/Ulcer of the Lower Extremity] [N/A:N/A] Comorbid History: [7:Congestive Heart Failure, Hypertension, Type II Diabetes, Received Radiation] [8:Congestive Heart Failure, Hypertension, Type II Diabetes, Received Radiation] [N/A:N/A] Date Acquired: [7:04/19/2022] [8:04/24/2022] [N/A:N/A] Weeks of Treatment: [7:2] [8:2] [N/A:N/A] Wound Status: [7:Open] [8:Open] [N/A:N/A] Wound Recurrence: [7:No] [8:No] [N/A:N/A] Measurements L x W x D (cm) [7:2x4.5x0.2] [8:1.1x2x0.1] [N/A:N/A] Area (cm) : [3:6.644] [8:1.728] [N/A:N/A] Volume (cm) : [7:1.414] [8:0.173] [N/A:N/A] % Reduction in Area: [7:27.40%] [8:26.70%] [N/A:N/A] % Reduction in Volume: [7:27.40%] [8:26.70%] [N/A:N/A] Classification: [7:Grade 2] [8:Grade 2] [N/A:N/A] Exudate Amount: [7:Medium] [8:Medium] [N/A:N/A] Exudate Type: [7:Serosanguineous] [8:Serosanguineous] [N/A:N/A] Exudate Color: [7:red, brown] [8:red, brown] [N/A:N/A] Granulation Amount: [7:Medium (34-66%)] [8:Medium (34-66%)] [N/A:N/A] Granulation Quality: [7:N/A] [8:Red] [N/A:N/A] Necrotic Amount: [7:Medium (34-66%)] [8:Medium (34-66%)] [N/A:N/A] Exposed Structures: [7:Fascia: No Fat Layer (Subcutaneous Tissue): No Tendon: No Muscle: No Joint: No Bone: No None] [8:Fat Layer (Subcutaneous Tissue): Yes  Fascia: No Tendon: No Muscle: No Joint: No Bone: No None] [N/A:N/A N/A] Treatment Notes Electronic Signature(s) Signed: 06/12/2022 8:13:04 AM By: Carlene Coria RN Entered By: Carlene Coria on 06/08/2022 10:34:41 [name] ([id number]) -------------------------------------------------------------------------------- [place] Details [name] Date of Service: 06/08/2022 10:15 AM Medical Record Number: [id number] Patient Account Number: [id number] Date of Birth/Sex: 07/16/1934 (87 y.o. F) Treating RN: Carlene Coria Primary Care Francella Barnett: Fulton Reek Other Clinician: Referring Jeanclaude Wentworth: Fulton Reek Treating Juvencio Verdi/Extender: Skipper Cliche in Treatment: 2 Active Inactive Nutrition Nursing Diagnoses: Impaired glucose control: actual or potential Goals: Patient/caregiver will maintain therapeutic glucose control Date Initiated: 05/23/2022 Target Resolution Date: 06/23/2022 Goal Status: Active Interventions: Assess HgA1c results as ordered upon admission and as needed Assess patient nutrition upon admission and as needed per policy Notes: Wound/Skin Impairment Nursing Diagnoses: Knowledge deficit related to ulceration/compromised skin integrity Goals: Patient/caregiver will verbalize understanding of skin care regimen Date Initiated: 05/23/2022 Target Resolution Date: 06/23/2022 Goal Status: Active Ulcer/skin breakdown will have a volume reduction of 30% by week 4 Date Initiated: 05/23/2022 Target Resolution Date: 06/23/2022 Goal Status: Active Ulcer/skin breakdown will have a volume reduction of 50% by week 8 Date Initiated: 05/23/2022 Target Resolution Date: 07/23/2022 Goal Status: Active Ulcer/skin breakdown will have a volume reduction of 80% by week 12 Date Initiated: 05/23/2022 Target Resolution Date: 08/23/2022 Goal Status: Active Ulcer/skin breakdown will heal within 14 weeks Date Initiated: 05/23/2022 Target  Resolution Date: 09/22/2022 Goal Status: Active Interventions: Assess patient/caregiver ability to obtain necessary supplies Assess patient/caregiver ability to perform ulcer/skin care regimen upon admission and as needed Assess ulceration(s) every visit Notes: Electronic Signature(s) Signed: 06/12/2022 8:13:04 AM By: Carlene Coria RN Entered By: Carlene Coria on 06/08/2022 10:34:23 [name] ([id number]) -------------------------------------------------------------------------------- Pain Assessment Details [name] Date of Service: 06/08/2022 10:15 AM Medical Record Number: [id number] Patient Account Number: [id number] Date of Birth/Sex: [birth year] (88 y.o. F) Treating RN: Carlene Coria Primary Care Tumeka Chimenti: Fulton Reek Other Clinician: Referring Krew Hortman: Fulton Reek Treating Neilani Duffee/Extender: Skipper Cliche in Treatment: 2 Active Problems Location of Pain Severity and Description of Pain Patient Has Paino No Site Locations Pain Management and Medication Current Pain Management: Electronic Signature(s) Signed: 06/12/2022 8:13:04 AM By: Carlene Coria RN Entered By: Carlene Coria on 06/08/2022 10:25:45 [name] ([id number]) -------------------------------------------------------------------------------- Patient/Caregiver Education Details [name] Date of Service: 06/08/2022 10:15 AM Medical Record Number: [id number] Patient Account Number: [id number] Date of Birth/Gender: 03-27-1934 (87 y.o. F) Treating RN: Carlene Coria Primary Care Physician: Fulton Reek Other Clinician:  Referring Physician: Fulton Reek Treating Physician/Extender: Skipper Cliche in Treatment: 2 Education Assessment Education Provided To: Patient Education Topics Provided Wound/Skin Impairment: Methods: Explain/Verbal Responses: State content correctly Electronic Signature(s) Signed: 06/12/2022 8:13:04 AM By: Carlene Coria RN Entered By: Carlene Coria on 06/08/2022 14:04:41 [name] ([id number]) -------------------------------------------------------------------------------- Wound Assessment Details [name] Date of Service: 06/08/2022 10:15 AM Medical Record Number: [id number] Patient Account Number: [id number] Date of Birth/Sex: 08-17-1934 (87 y.o. F) Treating RN: Carlene Coria Primary Care Bianney Rockwood: Fulton Reek Other Clinician: Referring Trasean Delima: Fulton Reek Treating Tylan Kinn/Extender: Skipper Cliche in Treatment: 2 Wound Status Wound Number: 7 Primary Diabetic Wound/Ulcer of the Lower Extremity Etiology: Wound Location: Right, Medial Lower Leg Wound Status: Open Wounding Event: Trauma Comorbid Congestive Heart Failure, Hypertension, Type II Date Acquired: 04/19/2022 History: Diabetes, Received Radiation Weeks Of Treatment: 2 Clustered Wound: No Wound Measurements Length: (cm) 2 Width: (cm) 4.5 Depth: (cm) 0.2 Area: (cm) 7.069 Volume: (cm) 1.414 % Reduction in Area: 27.4% % Reduction in Volume: 27.4% Epithelialization: None Tunneling: No Undermining: No Wound Description Classification: Grade 2 Exudate Amount: Medium Exudate Type: Serosanguineous Exudate Color: red, brown Foul Odor After Cleansing: No Slough/Fibrino Yes Wound Bed Granulation Amount: Medium (34-66%) Exposed Structure Necrotic Amount: Medium (34-66%) Fascia Exposed: No Necrotic Quality: Adherent Slough Fat Layer (Subcutaneous Tissue) Exposed: No Tendon Exposed: No Muscle Exposed: No Joint Exposed: No Bone Exposed: No Treatment Notes Wound #7 (Lower Leg) Wound Laterality: Right, Medial Cleanser Byram Ancillary Kit - 15 Day Supply Discharge Instruction: Use supplies as instructed; Kit contains: (15) Saline Bullets; (15) 3x3 Gauze; 15 pr Gloves Soap and Water Discharge Instruction: Gently cleanse wound with antibacterial soap, rinse and pat dry prior to dressing  wounds Wound Cleanser Discharge Instruction: Wash your hands with soap and water. Remove old dressing, discard into plastic bag and place into trash. Cleanse the wound with Wound Cleanser prior to applying a clean dressing using gauze sponges, not tissues or cotton balls. Do not scrub or use excessive force. Pat dry using gauze sponges, not tissue or cotton balls. Peri-Wound Care Topical Primary Dressing Silvercel 4 1/4x 4 1/4 (in/in) Discharge Instruction: Apply Silvercel 4 1/4x 4 1/4 (in/in) as instructed Secondary Dressing Gauze [name], [name] ([id number]) Discharge Instruction: As directed: dry, Secured With Tubigrip Size D, 3x10 (in/yd) Compression Wrap Compression Stockings Add-Ons Electronic Signature(s) Signed: 06/12/2022 8:13:04 AM By: Carlene Coria RN Entered By: Carlene Coria on 06/08/2022 10:32:48 [name] ([id number]) -------------------------------------------------------------------------------- Wound Assessment Details [name] Date of Service: 06/08/2022 10:15 AM Medical Record Number: [id number] Patient Account Number: [id number] Date of Birth/Sex: 1934/02/27 (87 y.o. F) Treating RN: Carlene Coria Primary Care Kamryn Gauthier: Fulton Reek Other Clinician: Referring Thane Age: Fulton Reek Treating Lianah Peed/Extender: Skipper Cliche in Treatment: 2 Wound Status Wound Number: 8 Primary Diabetic Wound/Ulcer of the Lower Extremity Etiology: Wound Location: Left, Lateral Lower Leg Wound Status: Open Wounding Event: Trauma Comorbid Congestive Heart Failure, Hypertension, Type II Date Acquired: 04/24/2022 History: Diabetes, Received Radiation Weeks Of Treatment: 2 Clustered Wound: No Wound Measurements Length: (cm) 1.1 Width: (cm) 2 Depth: (cm) 0.1 Area: (cm) 1.728 Volume: (cm) 0.173 % Reduction in Area: 26.7% % Reduction in Volume: 26.7% Epithelialization: None Tunneling: No Undermining: No Wound  Description Classification: Grade 2 Exudate Amount: Medium Exudate Type: Serosanguineous Exudate Color: red, brown Foul Odor After Cleansing: No Slough/Fibrino Yes Wound Bed Granulation Amount: Medium (34-66%) Exposed Structure Granulation Quality: Red Fascia Exposed: No Necrotic Amount: Medium (34-66%) Fat Layer (Subcutaneous Tissue)  Exposed: Yes Necrotic Quality: Adherent Slough Tendon Exposed: No Muscle Exposed: No Joint Exposed: No Bone Exposed: No Treatment Notes Wound #8 (Lower Leg) Wound Laterality: Left, Lateral Cleanser Byram Ancillary Kit - 15 Day Supply Discharge Instruction: Use supplies as instructed; Kit contains: (15) Saline Bullets; (15) 3x3 Gauze; 15 pr Gloves Soap and Water Discharge Instruction: Gently cleanse wound with antibacterial soap, rinse and pat dry prior to dressing wounds Wound Cleanser Discharge Instruction: Wash your hands with soap and water. Remove old dressing, discard into plastic bag and place into trash. Cleanse the wound with Wound Cleanser prior to applying a clean dressing using gauze sponges, not tissues or cotton balls. Do not scrub or use excessive force. Pat dry using gauze sponges, not tissue or cotton balls. Peri-Wound Care Topical Primary Dressing Silvercel 4 1/4x 4 1/4 (in/in) Discharge Instruction: Apply Silvercel 4 1/4x 4 1/4 (in/in) as instructed Secondary Dressing Gauze [name], [name] ([id number]) Discharge Instruction: As directed: dry, Secured With Tubigrip Size D, 3x10 (in/yd) Compression Wrap Compression Stockings Add-Ons Electronic Signature(s) Signed: 06/12/2022 8:13:04 AM By: Carlene Coria RN Entered By: Carlene Coria on 06/08/2022 10:33:02 [name] ([id number]) -------------------------------------------------------------------------------- Vitals Details [name] Date of Service: 06/08/2022 10:15 AM Medical Record Number: [id number] Patient Account Number: [id number] Date  of Birth/Sex: [birth year] (88 y.o. F) Treating RN: Carlene Coria Primary Care Ishmail Mcmanamon: Fulton Reek Other Clinician: Referring Herman Fiero: Fulton Reek Treating Charmon Thorson/Extender: Skipper Cliche in Treatment: 2 Vital Signs Time Taken: 10:24 Temperature (F): 98 Height (in): 64 Pulse (bpm): 79 Weight (lbs): 166 Respiratory Rate (breaths/min): 18 Body Mass Index (BMI): 28.5 Blood Pressure (mmHg): 160/72 Reference Range: 80 - 120 mg / dl Electronic Signature(s) Signed: 06/12/2022 8:13:04 AM By: Carlene Coria RN Entered By: Carlene Coria on 06/08/2022 10:25:26

## 2022-06-15 ENCOUNTER — Encounter: Payer: Medicare Other | Admitting: Physician Assistant

## 2022-06-15 DIAGNOSIS — I87333 Chronic venous hypertension (idiopathic) with ulcer and inflammation of bilateral lower extremity: Secondary | ICD-10-CM | POA: Diagnosis not present

## 2022-06-15 NOTE — Progress Notes (Signed)
[name], [name] ([id number]) Visit Report for 06/15/2022 Chief Complaint Document Details [name], [name] Date of Service: 06/15/2022 11:00 AM Medical Record Number: [id number] Patient Account Number: [id number] Date of Birth/Sex: Oct 10, 1933 (88 y.o. F) Treating RN: Carlene Coria Primary Care Provider: Fulton Reek Other Clinician: Referring Provider: Fulton Reek Treating Provider/Extender: Skipper Cliche in Treatment: 3 Information Obtained from: Patient Chief Complaint Bilateral LE Ulcers Electronic Signature(s) Signed: 06/15/2022 11:28:29 AM By: Worthy Keeler PA-C Entered By: Worthy Keeler on 06/15/2022 11:28:28 [name] ([id number]) -------------------------------------------------------------------------------- Physician Orders Details [name] Date of Service: 06/15/2022 11:00 AM Medical Record Number: [id number] Patient Account Number: [id number] Date of Birth/Sex: January 13, 1934 (87 y.o. 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 Bathing/ Shower/ Hygiene o May shower with wound dressing protected with water repellent cover or cast protector. Anesthetic (Use 'Patient Medications' Section  for Anesthetic Order Entry) o Lidocaine applied to wound bed Edema Control - Lymphedema / Segmental Compressive Device / Other o Optional: One layer of unna paste to top of compression wrap (to act as an anchor). o Tubigrip single layer applied. - size D o Elevate, Exercise Daily and Avoid Standing for Long Periods of Time. o Elevate legs to the level of the heart and pump ankles as often as possible o Elevate leg(s) parallel to the floor when sitting. Wound Treatment Wound #7 - Lower Leg Wound Laterality: Right, Medial Cleanser: Byram Ancillary Kit - 15 Day Supply (Generic) 3 x Per Week/30 Days Discharge Instructions: Use supplies as instructed; Kit contains: (15) Saline Bullets; (15) 3x3 Gauze; 15 pr Gloves Cleanser: Soap and Water 3 x Per Week/30 Days Discharge Instructions: Gently cleanse wound with antibacterial soap, rinse and pat dry prior to dressing wounds Cleanser: Wound Cleanser 3 x Per Week/30 Days Discharge Instructions: Wash your hands with soap and water. Remove old dressing, discard into plastic bag and place into trash. Cleanse the wound with Wound Cleanser prior to applying a clean dressing using gauze sponges, not tissues or cotton balls. Do not scrub or use excessive force. Pat dry using gauze sponges, not tissue or cotton balls. Primary Dressing: Silvercel 4 1/4x 4 1/4 (in/in) (Generic) 3 x Per Week/30 Days Discharge Instructions: Apply Silvercel 4 1/4x 4 1/4 (in/in) as instructed Secondary Dressing: Gauze (Generic) 3 x Per Week/30 Days Discharge Instructions: As directed: dry, Secured With: Tubigrip Size D, 3x10 (in/yd) 3 x Per Week/30 Days Wound #8 - Lower Leg Wound Laterality: Left, Lateral Cleanser: Byram Ancillary Kit - 15 Day Supply (Generic) 3 x Per Week/30 Days Discharge Instructions: Use supplies as instructed; Kit contains: (15) Saline Bullets; (15) 3x3 Gauze; 15 pr Gloves Cleanser: Soap and Water 3 x Per Week/30 Days Discharge Instructions:  Gently cleanse wound with antibacterial soap, rinse and pat dry prior to dressing wounds [name], Sarah T. ([id number]) Cleanser: Wound Cleanser 3 x Per Week/30 Days Discharge  Instructions: Wash your hands with soap and water. Remove old dressing, discard into plastic bag and place into trash. Cleanse the wound with Wound Cleanser prior to applying a clean dressing using gauze sponges, not tissues or cotton balls. Do not scrub or use excessive force. Pat dry using gauze sponges, not tissue or cotton balls. Primary Dressing: Silvercel 4 1/4x 4 1/4 (in/in) (Generic) 3 x Per Week/30 Days Discharge Instructions: Apply Silvercel 4 1/4x 4 1/4 (in/in) as instructed Secondary Dressing: Gauze (Generic) 3 x Per Week/30 Days Discharge Instructions: As directed: dry, Secured With: Tubigrip Size D, 3x10 (in/yd) 3 x Per Week/30 Days Electronic Signature(s) Unsigned Entered ByCarlene Coria on 06/15/2022 11:30:35 Signature(s): Date(s): [name] ([id number]) -------------------------------------------------------------------------------- Problem List Details [name] Date of Service: 06/15/2022 11:00 AM Medical Record Number: [id number] Patient Account Number: [id number] Date of Birth/Sex: Nov 01, 1933 (87 y.o. F) Treating RN: Carlene Coria Primary Care Provider: Fulton Reek Other Clinician: Referring Provider: Fulton Reek Treating Provider/Extender: Skipper Cliche in Treatment: 3 Active Problems ICD-10 Encounter Code Description Active Date MDM Diagnosis I87.333 Chronic venous hypertension (idiopathic) with ulcer and inflammation of 05/23/2022 No Yes bilateral lower extremity L97.812 Non-pressure chronic ulcer of other part of right lower leg with fat layer 05/23/2022 No Yes exposed L97.822 Non-pressure chronic ulcer of other part of left lower leg with fat layer 05/23/2022 No Yes exposed [place] (primary) hypertension 05/23/2022 No Yes Z99.2  Dependence on renal dialysis 05/23/2022 No Yes 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

## 2022-06-16 NOTE — Progress Notes (Signed)
ZAHRAA, BHARGAVA ([id number]) Visit Report for 06/15/2022 Arrival Information Details [name], [name] Date of Service: 06/15/2022 11:00 AM Medical Record Number: [id number] Patient Account Number: [id number] Date of Birth/Sex: 02-Feb-1934 (87 y.o. F) Treating RN: Carlene Coria Primary Care Aneita Kiger: Fulton Reek Other Clinician: Referring Janmarie Smoot: Fulton Reek Treating Zyanya Glaza/Extender: Skipper Cliche in Treatment: 3 Visit Information History Since Last Visit All ordered tests and consults were completed: No Patient Arrived: Wheel Chair Added or deleted any medications: No Arrival Time: 11:13 Any new allergies or adverse reactions: No Accompanied By: husband Had a fall or experienced change in No Transfer Assistance: None activities of daily living that may affect Patient Identification Verified: Yes risk of falls: Secondary Verification Process Completed: Yes Signs or symptoms of abuse/neglect since last visito No Patient Requires Transmission-Based No Hospitalized since last visit: No Precautions: Implantable device outside of the clinic excluding No Patient Has Alerts: Yes cellular tissue based products placed in the center Patient Alerts: Patient on Blood since last visit: Thinner Has Dressing in Place as Prescribed: Yes DIABETIC Has Compression in Place as Prescribed: Yes ABI LEFT .79 02/14/22 ABI RIGHT 1.02 Pain Present Now: No 02/14/22 Electronic Signature(s) Signed: 06/16/2022 12:07:54 PM By: Carlene Coria RN Entered By: Carlene Coria on 06/15/2022 11:18:56 [name] ([id number]) -------------------------------------------------------------------------------- Clinic Level of Care Assessment Details [name] Date of Service: 06/15/2022 11:00 AM Medical Record Number: [id number] Patient Account Number: [id number] Date of Birth/Sex: [birth year] (87 y.o. F) Treating RN: Carlene Coria Primary Care Mosie Angus:  Fulton Reek Other Clinician: Referring Stacey Maura: Fulton Reek Treating Zhaniya Swallows/Extender: Skipper Cliche in Treatment: 3 Clinic Level of Care Assessment Items TOOL 4 Quantity Score X - Use when only an EandM is performed on FOLLOW-UP visit 1 0 ASSESSMENTS - Nursing Assessment / Reassessment X - Reassessment of Co-morbidities (includes updates in patient status) 1 10 X- 1 5 Reassessment of Adherence to Treatment Plan ASSESSMENTS - Wound and Skin Assessment / Reassessment _0  - Simple Wound Assessment / Reassessment - one wound 0 X- 2 5 Complex Wound Assessment / Reassessment - multiple wounds _1  - 0 Dermatologic / Skin Assessment (not related to wound area) ASSESSMENTS - Focused Assessment _2  - Circumferential Edema Measurements - multi extremities 0 _3  - 0 Nutritional Assessment / Counseling / Intervention _4  - 0 Lower Extremity Assessment (monofilament, tuning fork, pulses) _5  - 0 Peripheral Arterial Disease Assessment (using hand held doppler) ASSESSMENTS - Ostomy and/or Continence Assessment and Care _6  - Incontinence Assessment and Management 0 _7  - 0 Ostomy Care Assessment and Management (repouching, etc.) PROCESS - Coordination of Care X - Simple Patient / Family Education for ongoing care 1 15 _8  - 0 Complex (extensive) Patient / Family Education for ongoing care _9  - 0 Staff obtains Programmer, systems, Records, Test Results / Process Orders _10  - 0 Staff telephones HHA, Nursing Homes / Clarify orders / etc _11  - 0 Routine Transfer to another Facility (non-emergent condition) _12  - 0 Routine Hospital Admission (non-emergent condition) _13  - 0 New Admissions / Biomedical engineer / Ordering NPWT, Apligraf, etc. _14  - 0 Emergency Hospital Admission (emergent condition) X- 1 10 Simple Discharge Coordination _15  - 0 Complex (extensive) Discharge Coordination PROCESS - Special Needs _16  - Pediatric / Minor Patient Management 0 _17  - 0 Isolation Patient Management _18   - 0 Hearing / Language / Visual special needs _19  - 0 Assessment of Community assistance (transportation, D/C planning, etc.) _20  - 0 Additional assistance / Altered mentation _21  - 0 Support Surface(s)  Assessment (bed, cushion, seat, etc.) INTERVENTIONS - Wound Cleansing / Measurement Lobb, Akeelah T. ([id number]) _0  - 0 Simple Wound Cleansing - one wound X- 2 5 Complex Wound Cleansing - multiple wounds X- 1 5 Wound Imaging (photographs - any number of wounds) _1  - 0 Wound Tracing (instead of photographs) _2  - 0 Simple Wound Measurement - one wound X- 2 5 Complex Wound Measurement - multiple wounds INTERVENTIONS - Wound Dressings X - Small Wound Dressing one or multiple wounds 2 10 _3  - 0 Medium Wound Dressing one or multiple wounds _4  - 0 Large Wound Dressing one or multiple wounds <HENIDPOEUMPNTIRW>_4<\/RXVQMGQQPYPPJKDT>_2  - 0 Application of Medications - topical <IZTIWPYKDXIPJASN>_0<\/NLZJQBHALPFXTKWI>_0  - 0 Application of Medications - injection INTERVENTIONS - Miscellaneous _7  - External ear exam 0 _8  - 0 Specimen Collection (cultures, biopsies, blood, body fluids, etc.) _9  - 0 Specimen(s) / Culture(s) sent or taken to Lab for analysis _10  - 0 Patient Transfer (multiple staff / Civil Service fast streamer / Similar devices) _11  - 0 Simple Staple / Suture removal (25 or less) _12  - 0 Complex Staple / Suture removal (26 or more) _13  - 0 Hypo / Hyperglycemic Management (close monitor of Blood Glucose) _14  - 0 Ankle / Brachial Index (ABI) - do not check if billed separately X- 1 5 Vital Signs Has the patient been seen at the hospital within the last three years: Yes Total Score: 100 Level Of Care: New/Established - Level 3 Electronic Signature(s) Signed: 06/16/2022 12:07:54 PM By: Carlene Coria RN Entered By: Carlene Coria on 06/15/2022 11:31:32 [name] ([id number]) -------------------------------------------------------------------------------- Encounter Discharge Information Details [name] Date of Service: 06/15/2022  11:00 AM Medical Record Number: [id number] Patient Account Number: [id number] Date of Birth/Sex: 01-Sep-1934 (87 y.o. F) Treating RN: Carlene Coria Primary Care Bernhardt Riemenschneider: Fulton Reek Other Clinician: Referring Darrian Grzelak: Fulton Reek Treating Azhar Knope/Extender: Skipper Cliche in Treatment: 3 Encounter Discharge Information Items Discharge Condition: Stable Ambulatory Status: Wheelchair Discharge Destination: Home Transportation: Private Auto Accompanied By: husband Schedule Follow-up Appointment: Yes Clinical Summary of Care: Electronic Signature(s) Signed: 06/16/2022 12:07:54 PM By: Carlene Coria RN Entered By: Carlene Coria on 06/15/2022 11:32:37 [name] ([id number]) -------------------------------------------------------------------------------- Lower Extremity Assessment Details [name] Date of Service: 06/15/2022 11:00 AM Medical Record Number: [id number] Patient Account Number: [id number] Date of Birth/Sex: [birth year] (87 y.o. F) Treating RN: Carlene Coria Primary Care Icey Tello: Fulton Reek Other Clinician: Referring Kieara Schwark: Fulton Reek Treating Maranda Marte/Extender: Skipper Cliche in Treatment: 3 Edema Assessment Assessed: [Left: No] [Right: No] Edema: [Left: Yes] [Right: Yes] Calf Left: Right: Point of Measurement: 32 cm From Medial Instep 36 cm 34 cm Ankle Left: Right: Point of Measurement: 10 cm From Medial Instep 28 cm 28 cm Knee To Floor Left: Right: From Medial Instep 42 cm 42 cm Vascular Assessment Pulses: Dorsalis Pedis Palpable: [Left:Yes] [Right:Yes] Electronic Signature(s) Signed: 06/16/2022 12:07:54 PM By: Carlene Coria RN Entered By: Carlene Coria on 06/15/2022 11:26:58 [name] ([id number]) -------------------------------------------------------------------------------- Multi Wound Chart Details [name] Date of Service: 06/15/2022 11:00 AM Medical Record Number:  [id number] Patient Account Number: [id number] Date of Birth/Sex: 1934-06-28 (87 y.o. 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/Ulcer of the Lower N/A Extremity Extremity Comorbid History: Congestive Heart Failure, Congestive Heart Failure, N/A Hypertension, Type II Diabetes, Hypertension, Type II Diabetes, Received Radiation Received Radiation Date Acquired: 04/19/2022 04/24/2022 N/A Weeks of Treatment: 3 3 N/A Wound Status: Open Open N/A Wound Recurrence: No No N/A Measurements L x W x D (cm) 1.2x4x0.2 1x2x0.1 N/A Area (cm) : 3.77 1.571 N/A Volume (cm) : 0.754 0.157 N/A % Reduction in Area: 61.30% 33.30% N/A % Reduction in Volume: 61.30% 33.50% N/A Classification: Grade 2 Grade 2 N/A Exudate Amount: Medium Medium N/A Exudate Type: Serosanguineous Serosanguineous N/A Exudate Color: red, brown red, brown N/A Granulation Amount: Large (67-100%) Large (67-100%) N/A Granulation Quality: N/A Red N/A Necrotic Amount: Small (1-33%) Small (1-33%) N/A Exposed Structures: Fascia: No Fat Layer (Subcutaneous Tissue): N/A Fat Layer (Subcutaneous Tissue): Yes No Fascia: No Tendon: No Tendon: No Muscle: No Muscle: No Joint: No Joint: No Bone: No Bone: No Epithelialization: None None N/A Treatment Notes Electronic Signature(s) Signed: 06/16/2022 12:07:54 PM By: Carlene Coria RN Entered By: Carlene Coria on 06/15/2022 11:27:13 [name]  ([id number]) -------------------------------------------------------------------------------- [place] Details [name] Date of Service: 06/15/2022 11:00 AM Medical Record Number: [id number] Patient Account Number: [id number] Date of Birth/Sex: 09/18/1934 (87 y.o. F) Treating RN: Carlene Coria Primary Care Rodrickus Min: Fulton Reek Other Clinician: Referring Lilianne Delair: Fulton Reek Treating Velva Molinari/Extender: Skipper Cliche in Treatment: 3 Active Inactive Nutrition Nursing Diagnoses: Impaired glucose control: actual or potential Goals: Patient/caregiver will maintain therapeutic glucose control Date Initiated: 05/23/2022 Target Resolution Date: 06/23/2022 Goal Status: Active Interventions: Assess HgA1c results as ordered upon admission and as needed Assess patient nutrition upon admission and as needed per policy Notes: Wound/Skin Impairment Nursing Diagnoses: Knowledge deficit related to ulceration/compromised skin integrity Goals: Patient/caregiver will verbalize understanding of skin care regimen Date Initiated: 05/23/2022 Target Resolution Date: 06/23/2022 Goal Status: Active Ulcer/skin breakdown will have a volume reduction of 30% by week 4 Date Initiated: 05/23/2022 Target Resolution Date: 06/23/2022 Goal Status: Active Ulcer/skin breakdown will have a volume reduction of 50% by week 8 Date Initiated: 05/23/2022 Target Resolution Date: 07/23/2022 Goal Status: Active Ulcer/skin breakdown will have a volume reduction of 80% by week 12 Date Initiated: 05/23/2022 Target Resolution Date: 08/23/2022 Goal Status: Active Ulcer/skin breakdown will heal within 14 weeks Date Initiated: 05/23/2022 Target Resolution Date: 09/22/2022 Goal Status: Active Interventions: Assess patient/caregiver ability to obtain necessary supplies Assess patient/caregiver ability to perform ulcer/skin care regimen upon admission and as needed Assess  ulceration(s) every visit Notes: Electronic Signature(s) Signed: 06/16/2022 12:07:54 PM By: Carlene Coria RN Entered By: Carlene Coria on 06/15/2022 11:27:06 [name] ([id number]) -------------------------------------------------------------------------------- Pain Assessment Details [name] Date of Service: 06/15/2022 11:00 AM Medical Record Number: [id number] Patient Account Number: [id number] Date of Birth/Sex: 1933-12-05 (88 y.o. F) Treating RN: Carlene Coria Primary Care Bobbie Virden: Fulton Reek Other Clinician: Referring Brittnee Gaetano: Fulton Reek Treating Shaw Dobek/Extender: Skipper Cliche in Treatment: 3 Active Problems Location of Pain Severity and Description of Pain Patient Has Paino No Site Locations Pain Management and Medication Current Pain Management: Electronic Signature(s) Signed: 06/16/2022 12:07:54 PM By: Carlene Coria RN Entered By: Carlene Coria on 06/15/2022 11:19:19 [name] ([id number]) -------------------------------------------------------------------------------- Patient/Caregiver Education Details [name] Date of Service: 06/15/2022 11:00 AM Medical Record Number: [id number] Patient Account Number: [id number] Date of Birth/Gender: 1934/09/02 (87 y.o. F) Treating RN: Carlene Coria Primary Care Physician: Fulton Reek Other Clinician: Referring Physician:  Fulton Reek Treating Physician/Extender: Skipper Cliche in Treatment: 3 Education Assessment Education Provided To: Patient Education Topics Provided Wound/Skin Impairment: Methods: Explain/Verbal Responses: State content correctly Electronic Signature(s) Signed: 06/16/2022 12:07:54 PM By: Carlene Coria RN Entered By: Carlene Coria on 06/15/2022 11:31:48 [name] ([id number]) -------------------------------------------------------------------------------- Wound Assessment Details [name] Date of Service: 06/15/2022 11:00 AM Medical Record Number: [id number] Patient Account Number: [id number] Date of Birth/Sex: [birth year] (87 y.o. F) Treating RN: Carlene Coria Primary Care Vaanya Shambaugh: Fulton Reek Other Clinician: Referring Danila Eddie: Fulton Reek Treating Worthy Boschert/Extender: Skipper Cliche in Treatment: 3 Wound Status Wound Number: 7 Primary Diabetic Wound/Ulcer of the Lower Extremity Etiology: Wound Location: Right, Medial Lower Leg Wound Status: Open Wounding Event: Trauma Comorbid Congestive Heart Failure, Hypertension, Type II Date Acquired: 04/19/2022 History: Diabetes, Received Radiation Weeks Of Treatment: 3 Clustered Wound: No Photos Wound Measurements Length: (cm) 1.2 Width: (cm) 4 Depth: (cm) 0.2 Area: (cm) 3.77 Volume: (cm) 0.754 % Reduction in Area: 61.3% % Reduction in Volume: 61.3% Epithelialization: None Tunneling: No Undermining: No Wound Description Classification: Grade 2 Exudate Amount: Medium Exudate Type: Serosanguineous Exudate Color: red, brown Foul Odor After Cleansing: No Slough/Fibrino Yes Wound Bed Granulation Amount: Large (67-100%) Exposed Structure Necrotic Amount: Small (1-33%) Fascia Exposed: No Necrotic Quality: Adherent Slough Fat Layer (Subcutaneous Tissue) Exposed: No Tendon Exposed: No Muscle Exposed: No Joint Exposed: No Bone Exposed: No Treatment Notes Wound #7 (Lower Leg) Wound Laterality: Right, Medial Cleanser Byram Ancillary Kit - 15 Day Supply Discharge Instruction: Use supplies as instructed; Kit contains: (15) Saline Bullets; (15) 3x3 Gauze; 15 pr Gloves Soap and Water Discharge Instruction: Gently cleanse wound with antibacterial soap, rinse and pat dry prior to dressing wounds Fink, Chantalle T. ([id number]) Wound Cleanser Discharge Instruction: Wash your hands with soap and water. Remove old dressing, discard into plastic bag and place into trash. Cleanse the wound with Wound Cleanser  prior to applying a clean dressing using gauze sponges, not tissues or cotton balls. Do not scrub or use excessive force. Pat dry using gauze sponges, not tissue or cotton balls. Peri-Wound Care Topical Primary Dressing Silvercel 4 1/4x 4 1/4 (in/in) Discharge Instruction: Apply Silvercel 4 1/4x 4 1/4 (in/in) as instructed Secondary Dressing Gauze Discharge Instruction: As directed: dry, Secured With Tubigrip Size D, 3x10 (in/yd) Compression Wrap Compression Stockings Add-Ons Electronic Signature(s) Signed: 06/16/2022 12:07:54 PM By: Carlene Coria RN Entered By: Carlene Coria on 06/15/2022 11:25:19 [name] ([id number]) -------------------------------------------------------------------------------- Wound Assessment Details [name] Date of Service: 06/15/2022 11:00 AM Medical Record Number: [id number] Patient Account Number: [id number] Date of Birth/Sex: 1933/10/16 (87 y.o. F) Treating RN: Carlene Coria Primary Care Aanchal Cope: Fulton Reek Other Clinician: Referring Charlane Westry: Fulton Reek Treating Marisella Puccio/Extender: Skipper Cliche in Treatment: 3 Wound Status Wound Number: 8 Primary Diabetic Wound/Ulcer of the Lower Extremity Etiology: Wound Location: Left, Lateral Lower Leg Wound Status: Open Wounding Event: Trauma Comorbid Congestive Heart Failure, Hypertension, Type II Date Acquired: 04/24/2022 History: Diabetes, Received Radiation Weeks Of Treatment: 3 Clustered Wound: No Photos Wound Measurements Length: (cm) 1 Width: (cm) 2 Depth: (cm) 0.1 Area: (cm) 1.571 Volume: (cm) 0.157 % Reduction in Area: 33.3% % Reduction in Volume: 33.5% Epithelialization: None Tunneling: No Undermining: No Wound Description Classification: Grade 2 Exudate Amount: Medium Exudate Type: Serosanguineous Exudate Color: red, brown Foul Odor After Cleansing: No Slough/Fibrino Yes Wound Bed Granulation Amount: Large (67-100%) Exposed  Structure Granulation Quality: Red Fascia Exposed: No Necrotic Amount: Small (1-33%) Fat Layer (Subcutaneous Tissue)  Exposed: Yes Necrotic Quality: Adherent Slough Tendon Exposed: No Muscle Exposed: No Joint Exposed: No Bone Exposed: No Treatment Notes Wound #8 (Lower Leg) Wound Laterality: Left, Lateral Cleanser Byram Ancillary Kit - 15 Day Supply Discharge Instruction: Use supplies as instructed; Kit contains: (15) Saline Bullets; (15) 3x3 Gauze; 15 pr Gloves Soap and Water Discharge Instruction: Gently cleanse wound with antibacterial soap, rinse and pat dry prior to dressing wounds Neuman, Thomasina TMarland Kitchen ([id number]) Wound Cleanser Discharge Instruction: Wash your hands with soap and water. Remove old dressing, discard into plastic bag and place into trash. Cleanse the wound with Wound Cleanser prior to applying a clean dressing using gauze sponges, not tissues or cotton balls. Do not scrub or use excessive force. Pat dry using gauze sponges, not tissue or cotton balls. Peri-Wound Care Topical Primary Dressing Silvercel 4 1/4x 4 1/4 (in/in) Discharge Instruction: Apply Silvercel 4 1/4x 4 1/4 (in/in) as instructed Secondary Dressing Gauze Discharge Instruction: As directed: dry, Secured With Tubigrip Size D, 3x10 (in/yd) Compression Wrap Compression Stockings Add-Ons Electronic Signature(s) Signed: 06/16/2022 12:07:54 PM By: Carlene Coria RN Entered By: Carlene Coria on 06/15/2022 11:25:39 [name] ([id number]) -------------------------------------------------------------------------------- Vitals Details [name] Date of Service: 06/15/2022 11:00 AM Medical Record Number: [id number] Patient Account Number: [id number] Date of Birth/Sex: 02/05/1934 (87 y.o. F) Treating RN: Carlene Coria Primary Care Ireland Virrueta: Fulton Reek Other Clinician: Referring Rosealyn Little: Fulton Reek Treating Valla Pacey/Extender: Skipper Cliche in Treatment:  3 Vital Signs Time Taken: 11:18 Temperature (F): 97.9 Height (in): 64 Pulse (bpm): 82 Weight (lbs): 166 Respiratory Rate (breaths/min): 18 Body Mass Index (BMI): 28.5 Blood Pressure (mmHg): 166/66 Reference Range: 80 - 120 mg / dl Electronic Signature(s) Signed: 06/16/2022 12:07:54 PM By: Carlene Coria RN Entered By: Carlene Coria on 06/15/2022 11:19:13

## 2022-06-22 ENCOUNTER — Encounter: Payer: Medicare Other | Admitting: Physician Assistant

## 2022-06-22 DIAGNOSIS — I87333 Chronic venous hypertension (idiopathic) with ulcer and inflammation of bilateral lower extremity: Secondary | ICD-10-CM | POA: Diagnosis not present

## 2022-06-22 NOTE — Progress Notes (Addendum)
[name], [name] ([id number]) Visit Report for 06/22/2022 Chief Complaint Document Details [name], [name] Date of Service: 06/22/2022 2:15 PM Medical Record Number: [id number] Patient Account Number: [id number] Date of Birth/Sex: 06/26/1934 (88 y.o. F) Treating RN: Carlene Coria Primary Care Provider: Fulton Reek Other Clinician: Massie Kluver Referring Provider: Fulton Reek Treating Provider/Extender: Skipper Cliche in Treatment: 4 Information Obtained from: Patient Chief Complaint Bilateral LE Ulcers Electronic Signature(s) Signed: 06/22/2022 2:51:15 PM By: Worthy Keeler PA-C Entered By: Worthy Keeler on 06/22/2022 14:51:15 [name] ([id number]) -------------------------------------------------------------------------------- HPI Details [name] Date of Service: 06/22/2022 2:15 PM Medical Record Number: [id number] Patient Account Number: [id number] Date of Birth/Sex: 02/25/1934 (87 y.o. F) Treating RN: Carlene Coria Primary Care Provider: Fulton Reek Other Clinician: Massie Kluver Referring Provider: Fulton Reek Treating Provider/Extender: Skipper Cliche in Treatment: 4 History of Present Illness HPI Description: ADMISSION 07/14/2020. This is a medically complex 88 year old woman who is sent over urgently from her primary care doctor Dr. Doy Hutching at [facility] and we worked her in this afternoon. She is apparently developed a blister on the dorsal foot sometime over the last 2 weeks on the right which seems to have developed a hemorrhagic component to this. The history here is somewhat vague but this is happened since she left the hospital on 07/02/2020. She also has a blistered area on the left dorsal foot although this does not have a hemorrhagic component to it and it is not threatened to open where is the right side is definitely leaking fluid and could easily break down. I do not believe she has  been doing anything specific to this. With regards to her medical history I have reviewed the discharge summary from the hospital from 06/22/2020 through 07/02/2020. 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 She was given prednisone but was not felt to be a candidate for immunosuppression secondary to her age and comorbidities. She is ANA positive p-ANCA positive. She had a right IJ catheter placed on 9/24 and is being done on dialysis. She is a diabetic. She takes prednisone 20 mg twice daily. She was on aspirin and Plavix although I think these have been put on hold. Past medical history includes; pauci-immune crescentic glomerulonephritis. History of peripheral edema and type 2 diabetes left carotid artery stenosis with a history of a CVA, coronary artery disease and hypertension. She states that she tolerates dialysis reasonably poorly secondary to "butt cramps". She says that when she gets discomfort they stop her dialysis. This probably has something to do with her fluid overloaded state I cannot see any arterial evaluation in her record in [facility]. We could not do arterial ABIs on her because of complaints of discomfort 10/20; 1 week follow-up. This is a patient who is a diabetic. She recently developed acute renal failure because of pauci-immune glomerulonephritis. Sometime during her initial hospitalization she developed a wound on her right foot tremendous swelling in both legs. She was sent over here urgently last week by her primary care physician with what looks to be hematoma still contained on the right dorsal foot. We put silver alginate on this and put her in compression to control some of the swelling. The left leg had a similar appearance with severe edema in the left dorsal foot so we put that under compression as well. When she arrived back for nurse follow-up on Friday there was some  drainage  coming out of the wound which our nurse cultured. This is come back showing Pseudomonas Enterococcus faecalis and staph aureus although the identification of the staph aureus is not complete. I empirically put her on cefdinir 300 mg after dialysis on Tuesday Thursday and Saturday she is only had one dose. She has not been systemically unwell 10/27 1 week follow-up. With considerable help of her nursing staff I was finally able to speak to her nephrologist who manages her at dialysis. Started her on vancomycin and ceftazidime. This to cover the combination of methicillin sensitive staph aureus Enterococcus faecalis and Pseudomonas. Although the staph aureus was methicillin sensitive and the Enterococcus ampicillin sensitive vancomycin provided the best alternative here that can be given at dialysis and ceftazidime to cover the Pseudomonas. She had a traumatic wound on the top of her foot and a complicated abscess. She has significant tissue destruction from this although the wound is looking a lot better today. We are using silver alginate to the surface. X-ray of the foot did not show any deep obvious infection or osteomyelitis 12/1; since the patient was last here she was hospitalized from 11/3 through 11/10 with coag negative staphylococcal bacteremia. I do not know the the exact cause of this was determined. She was seen by vascular and they replaced her permacath on the right side of her neck. Her repeat blood cultures were negative. She is still on IV vancomycin ando Ceftazidime at dialysis. She generally feels systemically well she does not have a fever she is still receiving dialysis but reports she voids up to 8 times a day She has home health changing the dressing I believe they are using silver alginate under 3 layer compression. In addition to the large wound on her right anterior foot that we initially saw her for she has apparently a right lateral leg wound that she suffered  during a fall and 2 skin tears on the left anterior lower leg 12/8; patient comes in looking a lot better this week. One of the 2 skin tears she has on the left leg is healed the other is smaller. The area on the right lateral leg looks improved and her original wound on the right dorsal foot is a lot smaller and looks a lot better. We used Hydrofera Blue to all wound areas under 3 layer compression.. Apparently home health had some trouble with the compression wraps. 12/15 patient comes in with all of her wounds smaller including her original deep punched out wound on the right dorsal foot as well as the skin tears on the right posterior lateral calf and the left anterior lower leg. We have been using Hydrofera Blue under compression excellent results 10/12/2020 upon evaluation today patient appears to be doing decently well currently in regard to her foot ulcer. She does have a small hole still open with some tunneling that really goes proximally in the foot at around the 12:00 location. There is really not significant undermining other locations. This appears to be a small area of where there was a little fluid trapped. Nonetheless I do feel like this is still doing quite well and in general I think that she is going to benefit from possibly packing into this area with endoform in order to keep the area open while it granulates in. 11/09/2020 upon evaluation today patient appears to be doing really about the same in regard to her wound. There is a little reopening towards the distal portion of the wound bed but again I  think this is just more as a result of the dressing possibly getting stuck even to little bit of the scar tissue nonetheless it also anything looks worse although I am not sure the endoform is really doing the job for her. I think we may want to try something a little different here I think packing with a silver alginate dressing may be optimal as compared to what we tried at this  point. Is been almost a month since have seen her and we really have not seen dramatic improvement. 12/07/2020 upon evaluation today patient appears to be doing well with regard to the dorsal surface of her foot. Overall I feel like this looks to be completely healed today and this is excellent news. There is no evidence of active infection which is great news. No fevers, chills, nausea, vomiting, or diarrhea. [name], [name] ([id number]) READMISSION 01/26/2022 This is an 88 year old woman that we had in clinic October 21 through March 22 with bilateral lower extremity wounds on the dorsal feet at that time she had pauci-immune glomerulonephritis but was not yet on dialysis. We managed to heal her out. As I remember she had severe bilateral lower extremity edema secondary to acute on chronic renal failure. She arrives in clinic this time with a wound on her distal left dorsal foot that is been there for about 6 months. Completely eschar covered she has been treating this with Neosporin. She said she is on IV vancomycin and cefepime at dialysis although we have no independent verification of this. This is due to be finished on 01/30/2022. She says it was for this foot but she does not say that she has had x-rays and MRI etc. Past medical history is extensive; she has type 2 diabetes although not currently in any treatment, end-stage renal disease on dialysis Monday Wednesday and Friday, coronary artery disease status post CABG, combined systolic and diastolic heart failure, peripheral vascular disease, cerebrovascular disease. Carotid artery stenosis, COPD, breast cancer ABI in our clinic was 0.79. I cannot find that she has had previous noninvasive arterial studies. She is only able to walk around the house does not describe claudication that I can elicit Upon inspection patient's wounds actually appear to be doing decently well. On the left foot this is mainly dry drainage, bleeding, and  Iodoflex which is stuck on the surface of the wound I was able to get that off mechanically she did not want any sharp debridement whatsoever fortunately is able to get it off mechanically without using that just using saline and gauze. Subsequently I think this is otherwise doing quite well. On the right leg she does have swelling and she had a blister that popped up this feels like it almost completely sealed down but it still weeping and leaking a little bit I think we need to manage that as well to be honest. 02-14-2022 upon evaluation today patient appears to be doing well currently in regard to the wound on her foot. She has been tolerating the dressing changes there is a little bit of film buildup here in the patient last week was very resistant to debridement because her ABIs were somewhat low I was avoiding that as well. However she did see Dr. Lazaro Arms and he apparently told her that her ABIs were good everything appeared to be fine and to be honest I think this is excellent news. I am very pleased to hear this and I do believe doing some sharp debridement would be helpful at this  point as well. 02-21-2022 upon evaluation today patient appears to be doing well with regard to her wounds. Both are showing signs of improvement this is great news. Fortunately I do not see any evidence of active infection locally or systemically at this time which is great news. No fevers, chills, nausea, vomiting, or diarrhea. 03-02-2022 upon evaluation today patient appears to be doing better in regard to her right leg which appears to be healed. In regards to her left leg this on the foot at the top still has a small opening at this time. Fortunately there does not appear to be any signs of infection. With that being said I do think we may want to switch over to collagen based dressing to see if we get this to feeling a little bit better. 03-09-2022 upon evaluation today patient appears to be doing well currently in  regard to her wound on the top of her left foot. I am very pleased with where things stand and I think we are headed in the right direction here. She is still having a lot of trouble with the compression wrap. She is doing extremely well on the right side with the Tubigrip so I think we can probably switch over to using the Tubigrip on the left side as well especially since the collagen is getting so dry I do not believe this is doing her as much good as I would like for her to to be honest. 03-23-2022 upon evaluation today patient appears to be doing well with regard to her foot ulcer. This is pretty much healed at this point. Fortunately I see no signs of infection which is great news. The biggest issue I think is good to be keeping her edema under control. The Tubigrip is not very tight but unfortunately rolls down the cuts and on the top of her leg I think she needs something to try to help keep this under better control more evenly she has done well she tells me in the past with an Ace wrap I think we can probably give that a shot. Readmission: 05-23-2022 upon evaluation today patient presents for reevaluation here in the clinic she was last seen June 23, 2022. At that time we got her compression wraps with juxta lites. Unfortunately she did not wear these appropriately she tells me that they were unable to get them on. I am not really sure why that is and to be peripherally honest I think that this to some degree is just a matter of they let her legs get too swollen and out-of-control and then subsequently ended up with it getting to the point that the juxta lites will be been fit. Either way based on what I am seeing at this time I do believe that the patient has been required compression here in the office to get this under control I think the 3 layer compression wraps probably the right way to go she is not going to tolerate anything too tight and the 4-layer I think would be much too tight  for her. Patient's past medical history really has not changed she does have hypertension, end-stage renal disease, and she is on renal dialysis. Unfortunately they are not able to remove so much fluid as it throws her entire electrolyte imbalance that subsequently causes extreme cramping. 05-30-2022 upon evaluation today patient appears to be doing about the same in regard to her wounds. She needs to have some compression but she took off the 3 layer compression wrap  she tells me that it was about to make a fall several times and she could not continue to deal with that. She is states that there is no way she can afford to break anything which I completely understand. With that being said I do believe that the patient should likely have some type of compression on she told me that "she is not here for me to fix her fluid problems she is here for me to fix her wounds." With that being said I explained to the patient that there is not any way to do 1 without the other because the fluid is exacerbating the wound. She did not have much to say that to be honest. 06-08-2022 upon evaluation today patient appears to be doing well with regard to her legs. I do see signs of improvement she did keep the Tubigrip on and her and her husband have been doing this which is extremely good for her at this point. I do not see any signs of active infection which is great news and overall I think she is doing well in that regard. I do believe that keeping the edema under some control even if its not perfect is better than nothing. She just did not tolerate the compression wraps at all. 06-15-2022 upon evaluation today patient appears to be doing excellent in regard to her wound on each leg. She has been tolerating the dressing changes without complication and overall seems to be doing quite well. I do not see any signs of active infection locally or systemically at this point. 06-22-2022 upon evaluation today patient  appears to be doing well currently in regard to her wound. She has been tolerating the dressing changes Arseneau, Kaisa T. ([id number]) without complication. Fortunately there does not appear to be any evidence of active infection at this time which is great news. No fevers, chills, nausea, vomiting, or diarrhea. Electronic Signature(s) Signed: 06/22/2022 3:34:38 PM By: Worthy Keeler PA-C Entered By: Worthy Keeler on 06/22/2022 15:34:38 [name] ([id number]) -------------------------------------------------------------------------------- Physical Exam Details [name] Date of Service: 06/22/2022 2:15 PM Medical Record Number: [id number] Patient Account Number: [id number] Date of Birth/Sex: Dec 29, 1933 (87 y.o. F) Treating RN: Carlene Coria Primary Care Provider: Fulton Reek Other Clinician: Massie Kluver Referring Provider: Fulton Reek Treating Provider/Extender: Skipper Cliche in Treatment: 4 Constitutional Well-nourished and well-hydrated in no acute distress. Respiratory normal breathing without difficulty. Psychiatric this patient is able to make decisions and demonstrates good insight into disease process. Alert and Oriented x 3. pleasant and cooperative. Notes Patient appears to be doing excellent in regard to the wounds on her legs. She has been tolerating the dressing changes without complication and overall I am extremely pleased with where we stand today. No fevers, chills, nausea, vomiting, or diarrhea. Electronic Signature(s) Signed: 06/22/2022 3:35:01 PM By: Worthy Keeler PA-C Entered By: Worthy Keeler on 06/22/2022 15:35:00 [name] ([id number]) -------------------------------------------------------------------------------- Physician Orders Details [name] Date of Service: 06/22/2022 2:15 PM Medical Record Number: [id number] Patient Account Number: [id number] Date of Birth/Sex:  1934-01-03 (87 y.o. F) Treating RN: Carlene Coria Primary Care Provider: Fulton Reek Other Clinician: Massie Kluver Referring Provider: Fulton Reek Treating Provider/Extender: Skipper Cliche in Treatment: 4 Verbal / Phone Orders: No Diagnosis Coding ICD-10 Coding Code Description [phone] Chronic venous hypertension (idiopathic) with ulcer and inflammation of bilateral lower extremity L97.812 Non-pressure chronic ulcer of other part of right lower leg with fat layer exposed C16.384  Non-pressure chronic ulcer of other part of left lower leg with fat layer exposed I10 Essential (primary) hypertension Z99.2 Dependence on renal dialysis I12.0 Hypertensive chronic kidney disease with stage 5 chronic kidney disease or end stage renal disease Follow-up Appointments o Return Appointment in 1 week. Bathing/ Shower/ Hygiene o May shower with wound dressing protected with water repellent cover or cast protector. Anesthetic (Use 'Patient Medications' Section for Anesthetic Order Entry) o Lidocaine applied to wound bed Edema Control - Lymphedema / Segmental Compressive Device / Other o Optional: One layer of unna paste to top of compression wrap (to act as an anchor). o Tubigrip single layer applied. - size D o Elevate, Exercise Daily and Avoid Standing for Long Periods of Time. o Elevate legs to the level of the heart and pump ankles as often as possible o Elevate leg(s) parallel to the floor when sitting. Wound Treatment Wound #7 - Lower Leg Wound Laterality: Right, Medial Cleanser: Byram Ancillary Kit - 15 Day Supply (Generic) 3 x Per Week/30 Days Discharge Instructions: Use supplies as instructed; Kit contains: (15) Saline Bullets; (15) 3x3 Gauze; 15 pr Gloves Cleanser: Soap and Water 3 x Per Week/30 Days Discharge Instructions: Gently cleanse wound with antibacterial soap, rinse and pat dry prior to dressing wounds Cleanser: Wound Cleanser 3 x Per Week/30  Days Discharge Instructions: Wash your hands with soap and water. Remove old dressing, discard into plastic bag and place into trash. Cleanse the wound with Wound Cleanser prior to applying a clean dressing using gauze sponges, not tissues or cotton balls. Do not scrub or use excessive force. Pat dry using gauze sponges, not tissue or cotton balls. Primary Dressing: Silvercel 4 1/4x 4 1/4 (in/in) (Generic) 3 x Per Week/30 Days Discharge Instructions: Apply Silvercel 4 1/4x 4 1/4 (in/in) as instructed Secondary Dressing: Gauze (Generic) 3 x Per Week/30 Days Discharge Instructions: As directed: dry, Secured With: Tubigrip Size D, 3x10 (in/yd) 3 x Per Week/30 Days Wound #8 - Lower Leg Wound Laterality: Left, Lateral Cleanser: Byram Ancillary Kit - 15 Day Supply (Generic) 3 x Per Week/30 Days Discharge Instructions: Use supplies as instructed; Kit contains: (15) Saline Bullets; (15) 3x3 Gauze; 15 pr Gloves Cleanser: Soap and Water 3 x Per Week/30 Days Discharge Instructions: Gently cleanse wound with antibacterial soap, rinse and pat dry prior to dressing wounds [name], Sarah T. ([id number]) Cleanser: Wound Cleanser 3 x Per Week/30 Days Discharge Instructions: Wash your hands with soap and water. Remove old dressing, discard into plastic bag and place into trash. Cleanse the wound with Wound Cleanser prior to applying a clean dressing using gauze sponges, not tissues or cotton balls. Do not scrub or use excessive force. Pat dry using gauze sponges, not tissue or cotton balls. Primary Dressing: Silvercel 4 1/4x 4 1/4 (in/in) (Generic) 3 x Per Week/30 Days Discharge Instructions: Apply Silvercel 4 1/4x 4 1/4 (in/in) as instructed Secondary Dressing: Gauze (Generic) 3 x Per Week/30 Days Discharge Instructions: As directed: dry, Secured With: Tubigrip Size D, 3x10 (in/yd) 3 x Per Week/30 Days Wound #9 - Lower Leg Wound Laterality: Left, Proximal Cleanser: Byram Ancillary Kit - 15 Day Supply  (Generic) 3 x Per Week/30 Days Discharge Instructions: Use supplies as instructed; Kit contains: (15) Saline Bullets; (15) 3x3 Gauze; 15 pr Gloves Cleanser: Soap and Water 3 x Per Week/30 Days Discharge Instructions: Gently cleanse wound with antibacterial soap, rinse and pat dry prior to dressing wounds Cleanser: Wound Cleanser 3 x Per Week/30 Days Discharge Instructions: Wash your hands with  soap and water. Remove old dressing, discard into plastic bag and place into trash. Cleanse the wound with Wound Cleanser prior to applying a clean dressing using gauze sponges, not tissues or cotton balls. Do not scrub or use excessive force. Pat dry using gauze sponges, not tissue or cotton balls. Primary Dressing: Silvercel 4 1/4x 4 1/4 (in/in) (Generic) 3 x Per Week/30 Days Discharge Instructions: Apply Silvercel 4 1/4x 4 1/4 (in/in) as instructed Secondary Dressing: Gauze (Generic) 3 x Per Week/30 Days Discharge Instructions: As directed: dry, Secured With: Tubigrip Size D, 3x10 (in/yd) 3 x Per Week/30 Days Electronic Signature(s) Signed: 06/22/2022 5:08:53 PM By: Massie Kluver Signed: 06/23/2022 1:36:52 PM By: Worthy Keeler PA-C Entered By: Massie Kluver on 06/22/2022 15:21:23 [name] ([id number]) -------------------------------------------------------------------------------- Problem List Details [name] Date of Service: 06/22/2022 2:15 PM Medical Record Number: [id number] Patient Account Number: [id number] Date of Birth/Sex: 08-10-1934 (88 y.o. F) Treating RN: Carlene Coria Primary Care Provider: Fulton Reek Other Clinician: Massie Kluver Referring Provider: Fulton Reek Treating Provider/Extender: Skipper Cliche in Treatment: 4 Active Problems ICD-10 Encounter Code Description Active Date MDM Diagnosis I87.333 Chronic venous hypertension (idiopathic) with ulcer and inflammation of 05/23/2022 No Yes bilateral lower extremity L97.812  Non-pressure chronic ulcer of other part of right lower leg with fat layer 05/23/2022 No Yes exposed L97.822 Non-pressure chronic ulcer of other part of left lower leg with fat layer 05/23/2022 No Yes exposed Pax (primary) hypertension 05/23/2022 No Yes Z99.2 Dependence on renal dialysis 05/23/2022 No Yes I12.0 Hypertensive chronic kidney disease with stage 5 chronic kidney disease 05/23/2022 No Yes or end stage renal disease Inactive Problems Resolved Problems Electronic Signature(s) Signed: 06/22/2022 2:51:12 PM By: Worthy Keeler PA-C Entered By: Worthy Keeler on 06/22/2022 14:51:12 [name] ([id number]) -------------------------------------------------------------------------------- Progress Note Details [name] Date of Service: 06/22/2022 2:15 PM Medical Record Number: [id number] Patient Account Number: [id number] Date of Birth/Sex: [birth year] (87 y.o. F) Treating RN: Carlene Coria Primary Care Provider: Fulton Reek Other Clinician: Massie Kluver Referring Provider: Fulton Reek Treating Provider/Extender: Skipper Cliche in Treatment: 4 Subjective Chief Complaint Information obtained from Patient Bilateral LE Ulcers History of Present Illness (HPI) ADMISSION 07/14/2020. This is a medically complex 88 year old woman who is sent over urgently from her primary care doctor Dr. Doy Hutching at [facility] and we worked her in this afternoon. She is apparently developed a blister on the dorsal foot sometime over the last 2 weeks on the right which seems to have developed a hemorrhagic component to this. The history here is somewhat vague but this is happened since she left the hospital on 07/02/2020. She also has a blistered area on the left dorsal foot although this does not have a hemorrhagic component to it and it is not threatened to open where is the right side is definitely leaking fluid and could easily break down. I do not  believe she has been doing anything specific to this. With regards to her medical history I have reviewed the discharge summary from the hospital from 06/22/2020 through 07/02/2020. 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 She was given prednisone but was not felt to be a candidate for immunosuppression secondary to her age and comorbidities. She is ANA positive p-ANCA positive. She had a right IJ catheter placed on 9/24 and is being done on dialysis. She is a diabetic. She takes prednisone 20 mg twice daily.  She was on aspirin and Plavix although I think these have been put on hold. Past medical history includes; pauci-immune crescentic glomerulonephritis. History of peripheral edema and type 2 diabetes left carotid artery stenosis with a history of a CVA, coronary artery disease and hypertension. She states that she tolerates dialysis reasonably poorly secondary to "butt cramps". She says that when she gets discomfort they stop her dialysis. This probably has something to do with her fluid overloaded state I cannot see any arterial evaluation in her record in [facility]. We could not do arterial ABIs on her because of complaints of discomfort 10/20; 1 week follow-up. This is a patient who is a diabetic. She recently developed acute renal failure because of pauci-immune glomerulonephritis. Sometime during her initial hospitalization she developed a wound on her right foot tremendous swelling in both legs. She was sent over here urgently last week by her primary care physician with what looks to be hematoma still contained on the right dorsal foot. We put silver alginate on this and put her in compression to control some of the swelling. The left leg had a similar appearance with severe edema in the left dorsal foot so we put that under compression as well. When she arrived back for nurse follow-up on Friday  there was some drainage coming out of the wound which our nurse cultured. This is come back showing Pseudomonas Enterococcus faecalis and staph aureus although the identification of the staph aureus is not complete. I empirically put her on cefdinir 300 mg after dialysis on Tuesday Thursday and Saturday she is only had one dose. She has not been systemically unwell 10/27 1 week follow-up. With considerable help of her nursing staff I was finally able to speak to her nephrologist who manages her at dialysis. Started her on vancomycin and ceftazidime. This to cover the combination of methicillin sensitive staph aureus Enterococcus faecalis and Pseudomonas. Although the staph aureus was methicillin sensitive and the Enterococcus ampicillin sensitive vancomycin provided the best alternative here that can be given at dialysis and ceftazidime to cover the Pseudomonas. She had a traumatic wound on the top of her foot and a complicated abscess. She has significant tissue destruction from this although the wound is looking a lot better today. We are using silver alginate to the surface. X-ray of the foot did not show any deep obvious infection or osteomyelitis 12/1; since the patient was last here she was hospitalized from 11/3 through 11/10 with coag negative staphylococcal bacteremia. I do not know the the exact cause of this was determined. She was seen by vascular and they replaced her permacath on the right side of her neck. Her repeat blood cultures were negative. She is still on IV vancomycin ando Ceftazidime at dialysis. She generally feels systemically well she does not have a fever she is still receiving dialysis but reports she voids up to 8 times a day She has home health changing the dressing I believe they are using silver alginate under 3 layer compression. In addition to the large wound on her right anterior foot that we initially saw her for she has apparently a right lateral leg wound that  she suffered during a fall and 2 skin tears on the left anterior lower leg 12/8; patient comes in looking a lot better this week. One of the 2 skin tears she has on the left leg is healed the other is smaller. The area on the right lateral leg looks improved and her original wound  on the right dorsal foot is a lot smaller and looks a lot better. We used Hydrofera Blue to all wound areas under 3 layer compression.. Apparently home health had some trouble with the compression wraps. 12/15 patient comes in with all of her wounds smaller including her original deep punched out wound on the right dorsal foot as well as the skin tears on the right posterior lateral calf and the left anterior lower leg. We have been using Hydrofera Blue under compression excellent results 10/12/2020 upon evaluation today patient appears to be doing decently well currently in regard to her foot ulcer. She does have a small hole still open with some tunneling that really goes proximally in the foot at around the 12:00 location. There is really not significant undermining other locations. This appears to be a small area of where there was a little fluid trapped. Nonetheless I do feel like this is still doing quite well and in general I think that she is going to benefit from possibly packing into this area with endoform in order to keep the area open while it granulates in. 11/09/2020 upon evaluation today patient appears to be doing really about the same in regard to her wound. There is a little reopening towards the distal portion of the wound bed but again I think this is just more as a result of the dressing possibly getting stuck even to little bit of the scar tissue nonetheless it also anything looks worse although I am not sure the endoform is really doing the job for her. I think we may want to try something a little different here I think packing with a silver alginate dressing may be optimal as compared to what we tried  at this point. Is been Birchmeier, Sarah T. ([id number]) almost a month since have seen her and we really have not seen dramatic improvement. 12/07/2020 upon evaluation today patient appears to be doing well with regard to the dorsal surface of her foot. Overall I feel like this looks to be completely healed today and this is excellent news. There is no evidence of active infection which is great news. No fevers, chills, nausea, vomiting, or diarrhea. 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 We managed to heal her out. As I remember she had severe bilateral lower extremity edema secondary to acute on chronic renal failure. She arrives in clinic this time with a wound on her distal left dorsal foot that is been there for about 6 months. Completely eschar covered she has been treating this with Neosporin. She said she is on IV vancomycin and cefepime at dialysis although we have no independent verification of this. This is due to be finished on 01/30/2022. She says it was for this foot but she does not say that she has had x-rays and MRI etc. Past medical history is extensive; she has type 2 diabetes although not currently in any treatment, end-stage renal disease on dialysis Monday Wednesday and Friday, coronary artery disease status post CABG, combined systolic and diastolic heart failure, peripheral vascular disease, cerebrovascular disease. Carotid artery stenosis, COPD, breast cancer ABI in our clinic was 0.79. I cannot find that she has had previous noninvasive arterial studies. She is only able to walk around the house does not describe claudication that I can elicit Upon inspection  patient's wounds actually appear to be doing decently well. On the left foot this is mainly dry drainage, bleeding, and  Iodoflex which is stuck on the surface of the wound I was able to get that off mechanically she did not want any sharp debridement whatsoever fortunately is able to get it off mechanically without using that just using saline and gauze. Subsequently I think this is otherwise doing quite well. On the right leg she does have swelling and she had a blister that popped up this feels like it almost completely sealed down but it still weeping and leaking a little bit I think we need to manage that as well to be honest. 02-14-2022 upon evaluation today patient appears to be doing well currently in regard to the wound on her foot. She has been tolerating the dressing changes there is a little bit of film buildup here in the patient last week was very resistant to debridement because her ABIs were somewhat low I was avoiding that as well. However she did see Dr. Lazaro Arms and he apparently told her that her ABIs were good everything appeared to be fine and to be honest I think this is excellent news. I am very pleased to hear this and I do believe doing some sharp debridement would be helpful at this point as well. 02-21-2022 upon evaluation today patient appears to be doing well with regard to her wounds. Both are showing signs of improvement this is great news. Fortunately I do not see any evidence of active infection locally or systemically at this time which is great news. No fevers, chills, nausea, vomiting, or diarrhea. 03-02-2022 upon evaluation today patient appears to be doing better in regard to her right leg which appears to be healed. In regards to her left leg this on the foot at the top still has a small opening at this time. Fortunately there does not appear to be any signs of infection. With that being said I do think we may want to switch over to collagen based dressing to see if we get this to feeling a little bit better. 03-09-2022 upon evaluation today patient appears to be doing well currently in  regard to her wound on the top of her left foot. I am very pleased with where things stand and I think we are headed in the right direction here. She is still having a lot of trouble with the compression wrap. She is doing extremely well on the right side with the Tubigrip so I think we can probably switch over to using the Tubigrip on the left side as well especially since the collagen is getting so dry I do not believe this is doing her as much good as I would like for her to to be honest. 03-23-2022 upon evaluation today patient appears to be doing well with regard to her foot ulcer. This is pretty much healed at this point. Fortunately I see no signs of infection which is great news. The biggest issue I think is good to be keeping her edema under control. The Tubigrip is not very tight but unfortunately rolls down the cuts and on the top of her leg I think she needs something to try to help keep this under better control more evenly she has done well she tells me in the past with an Ace wrap I think we can probably give that a shot. Readmission: 05-23-2022 upon evaluation today patient presents for reevaluation here in the clinic she was last seen  June 23, 2022. At that time we got her compression wraps with juxta lites. Unfortunately she did not wear these appropriately she tells me that they were unable to get them on. I am not really sure why that is and to be peripherally honest I think that this to some degree is just a matter of they let her legs get too swollen and out-of-control and then subsequently ended up with it getting to the point that the juxta lites will be been fit. Either way based on what I am seeing at this time I do believe that the patient has been required compression here in the office to get this under control I think the 3 layer compression wraps probably the right way to go she is not going to tolerate anything too tight and the 4-layer I think would be much too tight  for her. Patient's past medical history really has not changed she does have hypertension, end-stage renal disease, and she is on renal dialysis. Unfortunately they are not able to remove so much fluid as it throws her entire electrolyte imbalance that subsequently causes extreme cramping. 05-30-2022 upon evaluation today patient appears to be doing about the same in regard to her wounds. She needs to have some compression but she took off the 3 layer compression wrap she tells me that it was about to make a fall several times and she could not continue to deal with that. She is states that there is no way she can afford to break anything which I completely understand. With that being said I do believe that the patient should likely have some type of compression on she told me that "she is not here for me to fix her fluid problems she is here for me to fix her wounds." With that being said I explained to the patient that there is not any way to do 1 without the other because the fluid is exacerbating the wound. She did not have much to say that to be honest. 06-08-2022 upon evaluation today patient appears to be doing well with regard to her legs. I do see signs of improvement she did keep the Tubigrip on and her and her husband have been doing this which is extremely good for her at this point. I do not see any signs of active infection which is great news and overall I think she is doing well in that regard. I do believe that keeping the edema under some control even if its not perfect is better than nothing. She just did not tolerate the compression wraps at all. 06-15-2022 upon evaluation today patient appears to be doing excellent in regard to her wound on each leg. She has been tolerating the dressing [name], Sarah T. ([id number]) changes without complication and overall seems to be doing quite well. I do not see any signs of active infection locally or systemically at this point. 06-22-2022  upon evaluation today patient appears to be doing well currently in regard to her wound. She has been tolerating the dressing changes without complication. Fortunately there does not appear to be any evidence of active infection at this time which is great news. No fevers, chills, nausea, vomiting, or diarrhea. Objective Constitutional Well-nourished and well-hydrated in no acute distress. Vitals Time Taken: 2:48 PM, Height: 64 in, Weight: 166 lbs, BMI: 28.5, Temperature: 97.9 F, Pulse: 79 bpm, Respiratory Rate: 18 breaths/min, Blood Pressure: 161/78 mmHg. Respiratory normal breathing without difficulty. Psychiatric this patient is able to make decisions and  demonstrates good insight into disease process. Alert and Oriented x 3. pleasant and cooperative. General Notes: Patient appears to be doing excellent in regard to the wounds on her legs. She has been tolerating the dressing changes without complication and overall I am extremely pleased with where we stand today. No fevers, chills, nausea, vomiting, or diarrhea. Integumentary (Hair, Skin) Wound #7 status is Open. Original cause of wound was Trauma. The date acquired was: 04/19/2022. The wound has been in treatment 4 weeks. The wound is located on the Right,Medial Lower Leg. The wound measures 1.6cm length x 4cm width x 0.02cm depth; 5.027cm^2 area and 0.101cm^3 volume. There is a medium amount of serosanguineous drainage noted. There is large (67-100%) granulation within the wound bed. There is a small (1-33%) amount of necrotic tissue within the wound bed including Adherent Slough. Wound #8 status is Open. Original cause of wound was Trauma. The date acquired was: 04/24/2022. The wound has been in treatment 4 weeks. The wound is located on the Left,Lateral Lower Leg. The wound measures 0.7cm length x 1.6cm width x 0.1cm depth; 0.88cm^2 area and 0.088cm^3 volume. There is Fat Layer (Subcutaneous Tissue) exposed. There is a medium amount of  serosanguineous drainage noted. There is large (67-100%) red granulation within the wound bed. There is a small (1-33%) amount of necrotic tissue within the wound bed including Adherent Slough. Wound #9 status is Open. Original cause of wound was Pressure Injury. The date acquired was: 06/19/2022. The wound is located on the Left,Proximal Lower Leg. The wound measures 0.6cm length x 0.6cm width x 0.1cm depth; 0.283cm^2 area and 0.028cm^3 volume. There is Fat Layer (Subcutaneous Tissue) exposed. There is no tunneling or undermining noted. There is a medium amount of serosanguineous drainage noted. There is no granulation within the wound bed. There is a small (1-33%) amount of necrotic tissue within the wound bed including Adherent Slough. Assessment Active Problems ICD-10 Chronic venous hypertension (idiopathic) with ulcer and inflammation of bilateral lower extremity Non-pressure chronic ulcer of other part of right lower leg with fat layer exposed Non-pressure chronic ulcer of other part of left lower leg with fat layer exposed Essential (primary) hypertension Dependence on renal dialysis Hypertensive chronic kidney disease with stage 5 chronic kidney disease or end stage renal disease Plan Follow-up Appointments: [name] ([id number]) Return Appointment in 1 week. Bathing/ Shower/ Hygiene: May shower with wound dressing protected with water repellent cover or cast protector. Anesthetic (Use 'Patient Medications' Section for Anesthetic Order Entry): Lidocaine applied to wound bed Edema Control - Lymphedema / Segmental Compressive Device / Other: Optional: One layer of unna paste to top of compression wrap (to act as an anchor). Tubigrip single layer applied. - size D Elevate, Exercise Daily and Avoid Standing for Long Periods of Time. Elevate legs to the level of the heart and pump ankles as often as possible Elevate leg(s) parallel to the floor when sitting. WOUND #7: -  Lower Leg Wound Laterality: Right, Medial Cleanser: Byram Ancillary Kit - 15 Day Supply (Generic) 3 x Per Week/30 Days Discharge Instructions: Use supplies as instructed; Kit contains: (15) Saline Bullets; (15) 3x3 Gauze; 15 pr Gloves Cleanser: Soap and Water 3 x Per Week/30 Days Discharge Instructions: Gently cleanse wound with antibacterial soap, rinse and pat dry prior to dressing wounds Cleanser: Wound Cleanser 3 x Per Week/30 Days Discharge Instructions: Wash your hands with soap and water. Remove old dressing, discard into plastic bag and place into trash. Cleanse the wound with Wound Cleanser prior  to applying a clean dressing using gauze sponges, not tissues or cotton balls. Do not scrub or use excessive force. Pat dry using gauze sponges, not tissue or cotton balls. Primary Dressing: Silvercel 4 1/4x 4 1/4 (in/in) (Generic) 3 x Per Week/30 Days Discharge Instructions: Apply Silvercel 4 1/4x 4 1/4 (in/in) as instructed Secondary Dressing: Gauze (Generic) 3 x Per Week/30 Days Discharge Instructions: As directed: dry, Secured With: Tubigrip Size D, 3x10 (in/yd) 3 x Per Week/30 Days WOUND #8: - Lower Leg Wound Laterality: Left, Lateral Cleanser: Byram Ancillary Kit - 15 Day Supply (Generic) 3 x Per Week/30 Days Discharge Instructions: Use supplies as instructed; Kit contains: (15) Saline Bullets; (15) 3x3 Gauze; 15 pr Gloves Cleanser: Soap and Water 3 x Per Week/30 Days Discharge Instructions: Gently cleanse wound with antibacterial soap, rinse and pat dry prior to dressing wounds Cleanser: Wound Cleanser 3 x Per Week/30 Days Discharge Instructions: Wash your hands with soap and water. Remove old dressing, discard into plastic bag and place into trash. Cleanse the wound with Wound Cleanser prior to applying a clean dressing using gauze sponges, not tissues or cotton balls. Do not scrub or use excessive force. Pat dry using gauze sponges, not tissue or cotton balls. Primary Dressing:  Silvercel 4 1/4x 4 1/4 (in/in) (Generic) 3 x Per Week/30 Days Discharge Instructions: Apply Silvercel 4 1/4x 4 1/4 (in/in) as instructed Secondary Dressing: Gauze (Generic) 3 x Per Week/30 Days Discharge Instructions: As directed: dry, Secured With: Tubigrip Size D, 3x10 (in/yd) 3 x Per Week/30 Days WOUND #9: - Lower Leg Wound Laterality: Left, Proximal Cleanser: Byram Ancillary Kit - 15 Day Supply (Generic) 3 x Per Week/30 Days Discharge Instructions: Use supplies as instructed; Kit contains: (15) Saline Bullets; (15) 3x3 Gauze; 15 pr Gloves Cleanser: Soap and Water 3 x Per Week/30 Days Discharge Instructions: Gently cleanse wound with antibacterial soap, rinse and pat dry prior to dressing wounds Cleanser: Wound Cleanser 3 x Per Week/30 Days Discharge Instructions: Wash your hands with soap and water. Remove old dressing, discard into plastic bag and place into trash. Cleanse the wound with Wound Cleanser prior to applying a clean dressing using gauze sponges, not tissues or cotton balls. Do not scrub or use excessive force. Pat dry using gauze sponges, not tissue or cotton balls. Primary Dressing: Silvercel 4 1/4x 4 1/4 (in/in) (Generic) 3 x Per Week/30 Days Discharge Instructions: Apply Silvercel 4 1/4x 4 1/4 (in/in) as instructed Secondary Dressing: Gauze (Generic) 3 x Per Week/30 Days Discharge Instructions: As directed: dry, Secured With: Tubigrip Size D, 3x10 (in/yd) 3 x Per Week/30 Days 1. I am going to suggest that we go ahead and continue with the recommendation for wound care measures as before and the patient is in agreement with plan. This includes the years of the silver alginate dressings followed by ABD pads and secured with tape. 2. Also can recommend that we have the patient continue with the Tubigrip size D which I think is doing quite well. 3. I would also suggest the patient continue to monitor for any signs of worsening or infection if anything changes she should contact  the office and let me know. We will see patient back for reevaluation in 1 week here in the clinic. If anything worsens or changes patient will contact our office for additional recommendations. Electronic Signature(s) Signed: 06/22/2022 3:35:35 PM By: Worthy Keeler PA-C Entered By: Worthy Keeler on 06/22/2022 15:35:35 [name] ([id number]) -------------------------------------------------------------------------------- SuperBill Details [name].  Date of Service: 06/22/2022 Medical Record Number: [id number] Patient Account Number: [id number] Date of Birth/Sex: 14-Dec-1933 (88 y.o. F) Treating RN: Carlene Coria Primary Care Provider: Fulton Reek Other Clinician: Massie Kluver Referring Provider: Fulton Reek Treating Provider/Extender: Skipper Cliche in Treatment: 4 Diagnosis Coding ICD-10 Codes Code Description [phone] Chronic venous hypertension (idiopathic) with ulcer and inflammation of bilateral lower extremity L97.812 Non-pressure chronic ulcer of other part of right lower leg with fat layer exposed L97.822 Non-pressure chronic ulcer of other part of left lower leg with fat layer exposed I10 Essential (primary) hypertension Z99.2 Dependence on renal dialysis I12.0 Hypertensive chronic kidney disease with stage 5 chronic kidney disease or end stage renal disease Facility Procedures CPT4 Code: [id number] Description: 99214 - WOUND CARE VISIT-LEV 4 EST PT Modifier: Quantity: 1 Physician Procedures CPT4 Code Description: [id number] - WC PHYS LEVEL 3 - EST PT Modifier: Quantity: 1 CPT4 Code Description: ICD-10 Diagnosis Description I87.333 Chronic venous hypertension (idiopathic) with ulcer and inflammation of L97.812 Non-pressure chronic ulcer of other part of right lower leg with fat lay L97.822 Non-pressure chronic ulcer of  other part of left lower leg with fat laye I10 Essential (primary) hypertension Modifier: bilateral lower  extre er exposed r exposed Quantity: Art gallery manager Signature(s) Signed: 06/22/2022 3:35:48 PM By: Worthy Keeler PA-C Entered By: Worthy Keeler on 06/22/2022 15:35:48

## 2022-06-23 NOTE — Progress Notes (Signed)
SIGNORA, ZUCCO ([id number]) Visit Report for 06/22/2022 Arrival Information Details [name] Date of Service: 06/22/2022 2:15 PM Medical Record Number: [id number] Patient Account Number: [id number] Date of Birth/Sex: 30-Jun-1934 (88 y.o. F) Treating RN: Carlene Coria Primary Care Boneta Standre: Fulton Reek Other Clinician: Massie Kluver Referring Sedale Jenifer: Fulton Reek Treating Gracen Southwell/Extender: Skipper Cliche in Treatment: 4 Visit Information History Since Last Visit All ordered tests and consults were completed: No Patient Arrived: Wheel Chair Added or deleted any medications: No Arrival Time: 14:47 Any new allergies or adverse reactions: No Transfer Assistance: EasyPivot Patient Lift Had a fall or experienced change in No Patient Requires Transmission-Based No activities of daily living that may affect Precautions: risk of falls: Patient Has Alerts: Yes Hospitalized since last visit: No Patient Alerts: Patient on Blood Pain Present Now: No Thinner DIABETIC ABI LEFT .79 02/14/22 ABI RIGHT 1.02 02/14/22 Electronic Signature(s) Signed: 06/22/2022 5:08:53 PM By: Massie Kluver Entered By: Massie Kluver on 06/22/2022 14:47:59 [name] ([id number]) -------------------------------------------------------------------------------- Clinic Level of Care Assessment Details [name] Date of Service: 06/22/2022 2:15 PM Medical Record Number: [id number] Patient Account Number: [id number] Date of Birth/Sex: 1933-10-29 (88 y.o. F) Treating RN: Carlene Coria Primary Care Ryland Tungate: Fulton Reek Other Clinician: Massie Kluver Referring [place] Kievit: Fulton Reek Treating Aniayah Alaniz/Extender: Skipper Cliche in Treatment: 4 Clinic Level of Care Assessment Items TOOL 4 Quantity Score '[]'$  - Use when only an EandM is performed on FOLLOW-UP visit 0 ASSESSMENTS - Nursing Assessment / Reassessment X - Reassessment of  Co-morbidities (includes updates in patient status) 1 10 X- 1 5 Reassessment of Adherence to Treatment Plan ASSESSMENTS - Wound and Skin Assessment / Reassessment '[]'$  - Simple Wound Assessment / Reassessment - one wound 0 X- 3 5 Complex Wound Assessment / Reassessment - multiple wounds '[]'$  - 0 Dermatologic / Skin Assessment (not related to wound area) ASSESSMENTS - Focused Assessment X - Circumferential Edema Measurements - multi extremities 3 5 '[]'$  - 0 Nutritional Assessment / Counseling / Intervention '[]'$  - 0 Lower Extremity Assessment (monofilament, tuning fork, pulses) '[]'$  - 0 Peripheral Arterial Disease Assessment (using hand held doppler) ASSESSMENTS - Ostomy and/or Continence Assessment and Care '[]'$  - Incontinence Assessment and Management 0 '[]'$  - 0 Ostomy Care Assessment and Management (repouching, etc.) PROCESS - Coordination of Care X - Simple Patient / Family Education for ongoing care 1 15 '[]'$  - 0 Complex (extensive) Patient / Family Education for ongoing care '[]'$  - 0 Staff obtains Programmer, systems, Records, Test Results / Process Orders '[]'$  - 0 Staff telephones HHA, Nursing Homes / Clarify orders / etc '[]'$  - 0 Routine Transfer to another Facility (non-emergent condition) '[]'$  - 0 Routine Hospital Admission (non-emergent condition) '[]'$  - 0 New Admissions / Biomedical engineer / Ordering NPWT, Apligraf, etc. '[]'$  - 0 Emergency Hospital Admission (emergent condition) X- 1 10 Simple Discharge Coordination '[]'$  - 0 Complex (extensive) Discharge Coordination PROCESS - Special Needs '[]'$  - Pediatric / Minor Patient Management 0 '[]'$  - 0 Isolation Patient Management '[]'$  - 0 Hearing / Language / Visual special needs '[]'$  - 0 Assessment of Community assistance (transportation, D/C planning, etc.) '[]'$  - 0 Additional assistance / Altered mentation '[]'$  - 0 Support Surface(s) Assessment (bed, cushion, seat, etc.) INTERVENTIONS - Wound Cleansing / Measurement [place], Yarnell T. ([id number]) '[]'$   - 0 Simple Wound Cleansing - one wound X- 3 5 Complex Wound Cleansing - multiple wounds X- 1 5 Wound Imaging (photographs - any number of wounds) '[]'$  - 0 Wound Tracing (  instead of photographs) '[]'$  - 0 Simple Wound Measurement - one wound X- 3 5 Complex Wound Measurement - multiple wounds INTERVENTIONS - Wound Dressings '[]'$  - Small Wound Dressing one or multiple wounds 0 X- 3 15 Medium Wound Dressing one or multiple wounds '[]'$  - 0 Large Wound Dressing one or multiple wounds '[]'$  - 0 Application of Medications - topical '[]'$  - 0 Application of Medications - injection INTERVENTIONS - Miscellaneous '[]'$  - External ear exam 0 '[]'$  - 0 Specimen Collection (cultures, biopsies, blood, body fluids, etc.) '[]'$  - 0 Specimen(s) / Culture(s) sent or taken to Lab for analysis '[]'$  - 0 Patient Transfer (multiple staff / Civil Service fast streamer / Similar devices) '[]'$  - 0 Simple Staple / Suture removal (25 or less) '[]'$  - 0 Complex Staple / Suture removal (26 or more) '[]'$  - 0 Hypo / Hyperglycemic Management (close monitor of Blood Glucose) '[]'$  - 0 Ankle / Brachial Index (ABI) - do not check if billed separately X- 1 5 Vital Signs Has the patient been seen at the hospital within the last three years: Yes Total Score: 155 Level Of Care: New/Established - Level 4 Electronic Signature(s) Signed: 06/22/2022 5:08:53 PM By: Massie Kluver Entered By: Massie Kluver on 06/22/2022 15:22:10 [name] ([id number]) -------------------------------------------------------------------------------- Encounter Discharge Information Details [name] Date of Service: 06/22/2022 2:15 PM Medical Record Number: [id number] Patient Account Number: [id number] Date of Birth/Sex: 01-Jul-1934 (88 y.o. F) Treating RN: Carlene Coria Primary Care Rethel Sebek: Fulton Reek Other Clinician: Massie Kluver Referring Bear Osten: Fulton Reek Treating Cellie Dardis/Extender: Skipper Cliche in Treatment: 4 Encounter  Discharge Information Items Discharge Condition: Stable Ambulatory Status: Wheelchair Discharge Destination: Home Transportation: Private Auto Accompanied By: husband Schedule Follow-up Appointment: Yes Clinical Summary of Care: Electronic Signature(s) Signed: 06/22/2022 5:08:53 PM By: Massie Kluver Entered By: Massie Kluver on 06/22/2022 17:06:26 [name] ([id number]) -------------------------------------------------------------------------------- Lower Extremity Assessment Details [name] Date of Service: 06/22/2022 2:15 PM Medical Record Number: [id number] Patient Account Number: [id number] Date of Birth/Sex: 08/10/1934 (88 y.o. F) Treating RN: Carlene Coria Primary Care Quiera Diffee: Fulton Reek Other Clinician: Massie Kluver Referring Takuya Lariccia: Fulton Reek Treating Kaoru Benda/Extender: Skipper Cliche in Treatment: 4 Edema Assessment Assessed: [Left: Yes] [Right: Yes] Edema: [Left: Ye] [Right: s] Calf Left: Right: Point of Measurement: 32 cm From Medial Instep 40.6 cm 41 cm Ankle Left: Right: Point of Measurement: 10 cm From Medial Instep 30.2 cm 32 cm Vascular Assessment Pulses: Dorsalis Pedis Palpable: [Left:Yes] [Right:Yes] Doppler Audible: [Right:Yes] Posterior Tibial Palpable: [Left:Yes] [Right:Yes] Electronic Signature(s) Signed: 06/22/2022 5:08:53 PM By: Massie Kluver Signed: 06/23/2022 9:44:45 AM By: Carlene Coria RN Entered By: Massie Kluver on 06/22/2022 15:06:49 [name] ([id number]) -------------------------------------------------------------------------------- Multi Wound Chart Details [name] Date of Service: 06/22/2022 2:15 PM Medical Record Number: [id number] Patient Account Number: [id number] Date of Birth/Sex: 1933-12-25 (88 y.o. 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 Extremity Extremity Comorbid History: Congestive Heart Failure, Congestive Heart Failure, Congestive Heart Failure, Hypertension, Type II Diabetes, Hypertension, Type II Diabetes, Hypertension, Type II Diabetes, Received  Radiation Received Radiation Received Radiation Date Acquired: 04/19/2022 04/24/2022 06/19/2022 Weeks of Treatment: 4 4 0 Wound Status: Open Open Open Wound Recurrence: No No No Measurements L x W x D (cm) 1.6x4x0.02 0.7x1.6x0.1 0.6x0.6x0.1 Area (cm) : 5.027 0.88 0.283 Volume (cm) : 0.101 0.088 0.028 % Reduction in Area: 48.40% 62.60% N/A % Reduction in Volume: 94.80% 62.70% N/A Classification: Grade 2 Grade 2 Full Thickness Without Exposed Support Structures Exudate Amount: Medium Medium Medium Exudate Type: Serosanguineous Serosanguineous Serosanguineous Exudate Color: red, brown red, brown red, brown Granulation Amount: Large (67-100%) Large (67-100%) None Present (0%) Granulation Quality: N/A Red N/A Necrotic Amount: Small (1-33%) Small (1-33%) Small (1-33%) Exposed Structures: Fascia: No Fat Layer (Subcutaneous Tissue): Fat Layer (Subcutaneous Tissue): Fat Layer (Subcutaneous Tissue): Yes Yes No Fascia: No Fascia: No Tendon: No Tendon: No Tendon: No Muscle: No Muscle: No Muscle: No Joint: No Joint: No Joint: No Bone: No Bone: No Bone: No Epithelialization: None None None Treatment Notes Electronic Signature(s) Signed: 06/22/2022 5:08:53 PM By: Massie Kluver Entered By: Massie Kluver on 06/22/2022 15:19:33 [name] ([id number]) -------------------------------------------------------------------------------- Multi-Disciplinary Care Plan Details [name] Date of Service: 06/22/2022 2:15 PM Medical Record Number: [id number] Patient Account Number: [id number] Date of Birth/Sex: November 13, 1933 (88 y.o. F) Treating RN: Carlene Coria Primary Care Katanya Schlie: Fulton Reek Other Clinician: Massie Kluver Referring Sujata Maines: Fulton Reek Treating Glenden Rossell/Extender: Skipper Cliche in Treatment: 4 Active Inactive Nutrition Nursing Diagnoses: Impaired glucose control: actual or potential Goals: Patient/caregiver will maintain therapeutic glucose control Date Initiated: 05/23/2022 Target Resolution Date: 06/23/2022 Goal Status: Active Interventions: Assess HgA1c results as ordered upon admission and as needed Assess patient nutrition upon admission and as needed per policy Notes: Wound/Skin Impairment Nursing Diagnoses: Knowledge deficit related to ulceration/compromised skin integrity Goals: Patient/caregiver will verbalize understanding of skin care regimen Date Initiated: 05/23/2022 Target Resolution Date: 06/23/2022 Goal Status: Active Ulcer/skin breakdown will have a volume reduction of 30% by week 4 Date Initiated: 05/23/2022 Target Resolution Date: 06/23/2022 Goal Status: Active Ulcer/skin breakdown will have a volume reduction of 50% by week 8 Date Initiated: 05/23/2022 Target Resolution Date: 07/23/2022 Goal Status: Active Ulcer/skin breakdown will have a volume reduction of 80% by week 12 Date Initiated: 05/23/2022 Target Resolution Date: 08/23/2022 Goal Status: Active Ulcer/skin breakdown will heal within 14 weeks Date Initiated: 05/23/2022 Target Resolution Date: 09/22/2022 Goal Status: Active Interventions: Assess patient/caregiver ability to obtain necessary supplies Assess  patient/caregiver ability to perform ulcer/skin care regimen upon admission and as needed Assess ulceration(s) every visit Notes: Electronic Signature(s) Signed: 06/22/2022 5:08:53 PM By: Massie Kluver Signed: 06/23/2022 9:44:45 AM By: Carlene Coria RN Entered By: Massie Kluver on 06/22/2022 15:06:55 [name] ([id number]) -------------------------------------------------------------------------------- Pain Assessment Details [name] Date of Service: 06/22/2022 2:15 PM Medical Record Number: [id number] Patient Account Number: [id number] Date of Birth/Sex: 03/19/1934 (88 y.o. F) Treating RN: Carlene Coria Primary Care Victoriano Campion: Fulton Reek Other Clinician: Massie Kluver Referring Oswell Say: Fulton Reek Treating Quinnetta Roepke/Extender: Skipper Cliche in Treatment: 4 Active Problems Location of Pain Severity and Description of Pain Patient Has Paino No Site Locations Pain Management and Medication Current Pain Management: Electronic Signature(s) Signed: 06/22/2022 5:08:53 PM By: Massie Kluver Signed: 06/23/2022 9:44:45 AM By: Carlene Coria RN Entered By: Massie Kluver on 06/22/2022 14:50:22 [name] ([id number]) -------------------------------------------------------------------------------- Patient/Caregiver Education Details [name] Date of Service: 06/22/2022 2:15 PM Medical Record Number: [id number] Patient Account Number: [id number] Date of Birth/Gender: 19-Mar-1934 (88 y.o. F) Treating RN: Epps,  Morey Hummingbird Primary Care Physician: Fulton Reek Other Clinician: Massie Kluver Referring Physician: Fulton Reek Treating Physician/Extender: Skipper Cliche in Treatment: 4 Education Assessment Education Provided To: Patient Education Topics Provided Wound/Skin Impairment: Handouts: Other: continue wound care as directed Methods: Explain/Verbal Responses: State content correctly Electronic  Signature(s) Signed: 06/22/2022 5:08:53 PM By: Massie Kluver Entered By: Massie Kluver on 06/22/2022 15:36:10 [name] ([id number]) -------------------------------------------------------------------------------- Wound Assessment Details [name] Date of Service: 06/22/2022 2:15 PM Medical Record Number: [id number] Patient Account Number: [id number] Date of Birth/Sex: [birth year] (88 y.o. F) Treating RN: Carlene Coria Primary Care Kathrynn Backstrom: Fulton Reek Other Clinician: Massie Kluver Referring Ahmani Daoud: Fulton Reek Treating Dewayne Jurek/Extender: Skipper Cliche in Treatment: 4 Wound Status Wound Number: 7 Primary Diabetic Wound/Ulcer of the Lower Extremity Etiology: Wound Location: Right, Medial Lower Leg Wound Status: Open Wounding Event: Trauma Comorbid Congestive Heart Failure, Hypertension, Type II Date Acquired: 04/19/2022 History: Diabetes, Received Radiation Weeks Of Treatment: 4 Clustered Wound: No Photos Wound Measurements Length: (cm) 1.6 % Reduc Width: (cm) 4 % Reduc Depth: (cm) 0.02 Epithel Area: (cm) 5.027 Volume: (cm) 0.101 tion in Area: 48.4% tion in Volume: 94.8% ialization: None Wound Description Classification: Grade 2 Foul O Exudate Amount: Medium Slough Exudate Type: Serosanguineous Exudate Color: red, brown dor After Cleansing: No /Fibrino Yes Wound Bed Granulation Amount: Large (67-100%) Exposed Structure Necrotic Amount: Small (1-33%) Fascia Exposed: No Necrotic Quality: Adherent Slough Fat Layer (Subcutaneous Tissue) Exposed: No Tendon Exposed: No Muscle Exposed: No Joint Exposed: No Bone Exposed: No Treatment Notes Wound #7 (Lower Leg) Wound Laterality: Right, Medial Cleanser Peri-Wound Care Topical Primary Dressing JUNIPER, COBEY ([id number]) Secondary Dressing Secured With Compression Wrap Compression Stockings Add-Ons Electronic Signature(s) Signed: 06/22/2022 5:08:53 PM By:  Massie Kluver Signed: 06/23/2022 9:44:45 AM By: Carlene Coria RN Entered By: Massie Kluver on 06/22/2022 15:02:22 [name] ([id number]) -------------------------------------------------------------------------------- Wound Assessment Details [name] Date of Service: 06/22/2022 2:15 PM Medical Record Number: [id number] Patient Account Number: [id number] Date of Birth/Sex: [birth year] (88 y.o. F) Treating RN: Carlene Coria Primary Care Shaiann Mcmanamon: Fulton Reek Other Clinician: Massie Kluver Referring Benjamim Harnish: Fulton Reek Treating Emanuell Morina/Extender: Skipper Cliche in Treatment: 4 Wound Status Wound Number: 8 Primary Diabetic Wound/Ulcer of the Lower Extremity Etiology: Wound Location: Left, Lateral Lower Leg Wound Status: Open Wounding Event: Trauma Comorbid Congestive Heart Failure, Hypertension, Type II Date Acquired: 04/24/2022 History: Diabetes, Received Radiation Weeks Of Treatment: 4 Clustered Wound: No Photos Wound Measurements Length: (cm) 0.7 % Redu Width: (cm) 1.6 % Redu Depth: (cm) 0.1 Epithe Area: (cm) 0.88 Volume: (cm) 0.088 ction in Area: 62.6% ction in Volume: 62.7% lialization: None Wound Description Classification: Grade 2 Foul Exudate Amount: Medium Sloug Exudate Type: Serosanguineous Exudate Color: red, brown Odor After Cleansing: No h/Fibrino Yes Wound Bed Granulation Amount: Large (67-100%) Exposed Structure Granulation Quality: Red Fascia Exposed: No Necrotic Amount: Small (1-33%) Fat Layer (Subcutaneous Tissue) Exposed: Yes Necrotic Quality: Adherent Slough Tendon Exposed: No Muscle Exposed: No Joint Exposed: No Bone Exposed: No Treatment Notes Wound #8 (Lower Leg) Wound Laterality: Left, Lateral Cleanser Peri-Wound Care Topical Primary Dressing ALVERNA, FAWLEY ([id number]) Secondary Dressing Secured With Compression Wrap Compression Stockings Add-Ons Electronic Signature(s) Signed:  06/22/2022 5:08:53 PM By: Massie Kluver Signed: 06/23/2022 9:44:45 AM By: Carlene Coria RN Entered By: Massie Kluver on 06/22/2022 15:03:09 [name] ([id number]) -------------------------------------------------------------------------------- Wound Assessment Details [name] Date of Service: 06/22/2022 2:15 PM Medical Record Number: [id number] Patient Account Number: [id number] Date of  Birth/Sex: [birth year] (88 y.o. F) Treating RN: Carlene Coria Primary Care Poonam Woehrle: Fulton Reek Other Clinician: Massie Kluver Referring Wanisha Shiroma: Fulton Reek Treating Thales Knipple/Extender: Skipper Cliche in Treatment: 4 Wound Status Wound Number: 9 Primary Venous Leg Ulcer Etiology: Wound Location: Left, Proximal Lower Leg Wound Status: Open Wounding Event: Pressure Injury Comorbid Congestive Heart Failure, Hypertension, Type II Date Acquired: 06/19/2022 History: Diabetes, Received Radiation Weeks Of Treatment: 0 Clustered Wound: No Photos Wound Measurements Length: (cm) 0.6 Width: (cm) 0.6 Depth: (cm) 0.1 Area: (cm) 0.283 Volume: (cm) 0.028 % Reduction in Area: % Reduction in Volume: Epithelialization: None Tunneling: No Undermining: No Wound Description Classification: Full Thickness Without Exposed Support Structures Exudate Amount: Medium Exudate Type: Serosanguineous Exudate Color: red, brown Foul Odor After Cleansing: No Slough/Fibrino Yes Wound Bed Granulation Amount: None Present (0%) Exposed Structure Necrotic Amount: Small (1-33%) Fascia Exposed: No Necrotic Quality: Adherent Slough Fat Layer (Subcutaneous Tissue) Exposed: Yes Tendon Exposed: No Muscle Exposed: No Joint Exposed: No Bone Exposed: No Treatment Notes Wound #9 (Lower Leg) Wound Laterality: Left, Proximal Cleanser Peri-Wound Care Topical Primary Dressing CHANETTE, DEMO ([id number]) Secondary Dressing Secured With Compression Wrap Compression  Stockings Add-Ons Electronic Signature(s) Signed: 06/22/2022 5:08:53 PM By: Massie Kluver Signed: 06/23/2022 9:44:45 AM By: Carlene Coria RN Entered By: Massie Kluver on 06/22/2022 15:05:16 [name] ([id number]) -------------------------------------------------------------------------------- Vitals Details [name] Date of Service: 06/22/2022 2:15 PM Medical Record Number: [id number] Patient Account Number: [id number] Date of Birth/Sex: Feb 17, 1934 (88 y.o. F) Treating RN: Carlene Coria Primary Care Kaylem Gidney: Fulton Reek Other Clinician: Massie Kluver Referring Pope Brunty: Fulton Reek Treating Beni Turrell/Extender: Skipper Cliche in Treatment: 4 Vital Signs Time Taken: 14:48 Temperature (F): 97.9 Height (in): 64 Pulse (bpm): 79 Weight (lbs): 166 Respiratory Rate (breaths/min): 18 Body Mass Index (BMI): 28.5 Blood Pressure (mmHg): 161/78 Reference Range: 80 - 120 mg / dl Electronic Signature(s) Signed: 06/22/2022 5:08:53 PM By: Massie Kluver Entered By: Massie Kluver on 06/22/2022 14:49:49

## 2022-06-29 ENCOUNTER — Encounter: Payer: Medicare Other | Admitting: Physician Assistant

## 2022-06-29 DIAGNOSIS — I87333 Chronic venous hypertension (idiopathic) with ulcer and inflammation of bilateral lower extremity: Secondary | ICD-10-CM | POA: Diagnosis not present

## 2022-06-29 NOTE — Progress Notes (Addendum)
Sarah, Weiss ([id number]) Visit Report for 06/29/2022 Chief Complaint Document Details Patient Name: Sarah, Weiss Date of Service: 06/29/2022 1:15 PM Medical Record Number: [id number] Patient Account Number: [id number] Date of Birth/Sex: 1934/03/10 (89 y.o. F) Treating RN: Carlene Coria Primary Care Provider: Fulton Reek Other Clinician: Referring Provider: Fulton Reek Treating Provider/Extender: Skipper Cliche in Treatment: 5 Information Obtained from: Patient Chief Complaint Bilateral LE Ulcers Electronic Signature(s) Signed: 06/29/2022 1:34:31 PM By: Worthy Keeler PA-C Entered By: Worthy Keeler on 06/29/2022 13:34:31 [name] ([id number]) -------------------------------------------------------------------------------- HPI Details [name] Date of Service: 06/29/2022 1:15 PM Medical Record Number: [id number] Patient Account Number: [id number] Date of Birth/Sex: [birth year] (87 y.o. F) Treating RN: Carlene Coria Primary Care Provider: Fulton Reek Other Clinician: Referring Provider: Fulton Reek Treating Provider/Extender: Skipper Cliche in Treatment: 5 History of Present Illness HPI Description: ADMISSION 07/14/2020. This is a medically complex 88 year old woman who is sent over urgently from her primary care doctor Dr. Doy Hutching at [facility] and we worked her in this afternoon. She is apparently developed a blister on the dorsal foot sometime over the last 2 weeks on the right which seems to have developed a hemorrhagic component to this. The history here is somewhat vague but this is happened since she left the hospital on 07/02/2020. She also has a blistered area on the left dorsal foot although this does not have a hemorrhagic component to it and it is not threatened to open where is the right side is definitely leaking fluid and could easily break down. I do not believe she has been doing anything specific to  this. With regards to her medical history I have reviewed the discharge summary from the hospital from 06/22/2020 through 07/02/2020. 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 She was given prednisone but was not felt to be a candidate for immunosuppression secondary to her age and comorbidities. She is ANA positive p-ANCA positive. She had a right IJ catheter placed on 9/24 and is being done on dialysis. She is a diabetic. She takes prednisone 20 mg twice daily. She was on aspirin and Plavix although I think these have been put on hold. Past medical history includes; pauci-immune crescentic glomerulonephritis. History of peripheral edema and type 2 diabetes left carotid artery stenosis with a history of a CVA, coronary artery disease and hypertension. She states that she tolerates dialysis reasonably poorly secondary to "butt cramps". She says that when she gets discomfort they stop her dialysis. This probably has something to do with her fluid overloaded state I cannot see any arterial evaluation in her record in [facility]. We could not do arterial ABIs on her because of complaints of discomfort 10/20; 1 week follow-up. This is a patient who is a diabetic. She recently developed acute renal failure because of pauci-immune glomerulonephritis. Sometime during her initial hospitalization she developed a wound on her right foot tremendous swelling in both legs. She was sent over here urgently last week by her primary care physician with what looks to be hematoma still contained on the right dorsal foot. We put silver alginate on this and put her in compression to control some of the swelling. The left leg had a similar appearance with severe edema in the left dorsal foot so we put that under compression as well. When she arrived back for nurse follow-up on Friday there was some drainage coming out of the wound  which our nurse cultured. This is come back showing Pseudomonas Enterococcus faecalis and staph aureus although the identification of the staph aureus is not complete. I empirically put her on cefdinir 300 mg after dialysis on Tuesday Thursday and Saturday she is only had one dose. She has not been systemically unwell 10/27 1 week follow-up. With considerable help of her nursing staff I was finally able to speak to her nephrologist who manages her at dialysis. Started her on vancomycin and ceftazidime. This to cover the combination of methicillin sensitive staph aureus Enterococcus faecalis and Pseudomonas. Although the staph aureus was methicillin sensitive and the Enterococcus ampicillin sensitive vancomycin provided the best alternative here that can be given at dialysis and ceftazidime to cover the Pseudomonas. She had a traumatic wound on the top of her foot and a complicated abscess. She has significant tissue destruction from this although the wound is looking a lot better today. We are using silver alginate to the surface. X-ray of the foot did not show any deep obvious infection or osteomyelitis 12/1; since the patient was last here she was hospitalized from 11/3 through 11/10 with coag negative staphylococcal bacteremia. I do not know the the exact cause of this was determined. She was seen by vascular and they replaced her permacath on the right side of her neck. Her repeat blood cultures were negative. She is still on IV vancomycin ando Ceftazidime at dialysis. She generally feels systemically well she does not have a fever she is still receiving dialysis but reports she voids up to 8 times a day She has home health changing the dressing I believe they are using silver alginate under 3 layer compression. In addition to the large wound on her right anterior foot that we initially saw her for she has apparently a right lateral leg wound that she suffered during a fall and 2 skin tears on the  left anterior lower leg 12/8; patient comes in looking a lot better this week. One of the 2 skin tears she has on the left leg is healed the other is smaller. The area on the right lateral leg looks improved and her original wound on the right dorsal foot is a lot smaller and looks a lot better. We used Hydrofera Blue to all wound areas under 3 layer compression.. Apparently home health had some trouble with the compression wraps. 12/15 patient comes in with all of her wounds smaller including her original deep punched out wound on the right dorsal foot as well as the skin tears on the right posterior lateral calf and the left anterior lower leg. We have been using Hydrofera Blue under compression excellent results 10/12/2020 upon evaluation today patient appears to be doing decently well currently in regard to her foot ulcer. She does have a small hole still open with some tunneling that really goes proximally in the foot at around the 12:00 location. There is really not significant undermining other locations. This appears to be a small area of where there was a little fluid trapped. Nonetheless I do feel like this is still doing quite well and in general I think that she is going to benefit from possibly packing into this area with endoform in order to keep the area open while it granulates in. 11/09/2020 upon evaluation today patient appears to be doing really about the same in regard to her wound. There is a little reopening towards the distal portion of the wound bed but again I think this is just more  as a result of the dressing possibly getting stuck even to little bit of the scar tissue nonetheless it also anything looks worse although I am not sure the endoform is really doing the job for her. I think we may want to try something a little different here I think packing with a silver alginate dressing may be optimal as compared to what we tried at this point. Is been almost a month since have  seen her and we really have not seen dramatic improvement. 12/07/2020 upon evaluation today patient appears to be doing well with regard to the dorsal surface of her foot. Overall I feel like this looks to be completely healed today and this is excellent news. There is no evidence of active infection which is great news. No fevers, chills, nausea, vomiting, or diarrhea. LEQUISHA, CAMMACK ([id number]) READMISSION 01/26/2022 This is an 88 year old woman that we had in clinic October 21 through March 22 with bilateral lower extremity wounds on the dorsal feet at that time she had pauci-immune glomerulonephritis but was not yet on dialysis. We managed to heal her out. As I remember she had severe bilateral lower extremity edema secondary to acute on chronic renal failure. She arrives in clinic this time with a wound on her distal left dorsal foot that is been there for about 6 months. Completely eschar covered she has been treating this with Neosporin. She said she is on IV vancomycin and cefepime at dialysis although we have no independent verification of this. This is due to be finished on 01/30/2022. She says it was for this foot but she does not say that she has had x-rays and MRI etc. Past medical history is extensive; she has type 2 diabetes although not currently in any treatment, end-stage renal disease on dialysis Monday Wednesday and Friday, coronary artery disease status post CABG, combined systolic and diastolic heart failure, peripheral vascular disease, cerebrovascular disease. Carotid artery stenosis, COPD, breast cancer ABI in our clinic was 0.79. I cannot find that she has had previous noninvasive arterial studies. She is only able to walk around the house does not describe claudication that I can elicit Upon inspection patient's wounds actually appear to be doing decently well. On the left foot this is mainly dry drainage, bleeding, and Iodoflex which is stuck on the surface of the wound  I was able to get that off mechanically she did not want any sharp debridement whatsoever fortunately is able to get it off mechanically without using that just using saline and gauze. Subsequently I think this is otherwise doing quite well. On the right leg she does have swelling and she had a blister that popped up this feels like it almost completely sealed down but it still weeping and leaking a little bit I think we need to manage that as well to be honest. 02-14-2022 upon evaluation today patient appears to be doing well currently in regard to the wound on her foot. She has been tolerating the dressing changes there is a little bit of film buildup here in the patient last week was very resistant to debridement because her ABIs were somewhat low I was avoiding that as well. However she did see Dr. Lazaro Arms and he apparently told her that her ABIs were good everything appeared to be fine and to be honest I think this is excellent news. I am very pleased to hear this and I do believe doing some sharp debridement would be helpful at this point as well. 02-21-2022 upon  evaluation today patient appears to be doing well with regard to her wounds. Both are showing signs of improvement this is great news. Fortunately I do not see any evidence of active infection locally or systemically at this time which is great news. No fevers, chills, nausea, vomiting, or diarrhea. 03-02-2022 upon evaluation today patient appears to be doing better in regard to her right leg which appears to be healed. In regards to her left leg this on the foot at the top still has a small opening at this time. Fortunately there does not appear to be any signs of infection. With that being said I do think we may want to switch over to collagen based dressing to see if we get this to feeling a little bit better. 03-09-2022 upon evaluation today patient appears to be doing well currently in regard to her wound on the top of her left foot. I am  very pleased with where things stand and I think we are headed in the right direction here. She is still having a lot of trouble with the compression wrap. She is doing extremely well on the right side with the Tubigrip so I think we can probably switch over to using the Tubigrip on the left side as well especially since the collagen is getting so dry I do not believe this is doing her as much good as I would like for her to to be honest. 03-23-2022 upon evaluation today patient appears to be doing well with regard to her foot ulcer. This is pretty much healed at this point. Fortunately I see no signs of infection which is great news. The biggest issue I think is good to be keeping her edema under control. The Tubigrip is not very tight but unfortunately rolls down the cuts and on the top of her leg I think she needs something to try to help keep this under better control more evenly she has done well she tells me in the past with an Ace wrap I think we can probably give that a shot. Readmission: 05-23-2022 upon evaluation today patient presents for reevaluation here in the clinic she was last seen June 23, 2022. At that time we got her compression wraps with juxta lites. Unfortunately she did not wear these appropriately she tells me that they were unable to get them on. I am not really sure why that is and to be peripherally honest I think that this to some degree is just a matter of they let her legs get too swollen and out-of-control and then subsequently ended up with it getting to the point that the juxta lites will be been fit. Either way based on what I am seeing at this time I do believe that the patient has been required compression here in the office to get this under control I think the 3 layer compression wraps probably the right way to go she is not going to tolerate anything too tight and the 4-layer I think would be much too tight for her. Patient's past medical history really has  not changed she does have hypertension, end-stage renal disease, and she is on renal dialysis. Unfortunately they are not able to remove so much fluid as it throws her entire electrolyte imbalance that subsequently causes extreme cramping. 05-30-2022 upon evaluation today patient appears to be doing about the same in regard to her wounds. She needs to have some compression but she took off the 3 layer compression wrap she tells me that it  was about to make a fall several times and she could not continue to deal with that. She is states that there is no way she can afford to break anything which I completely understand. With that being said I do believe that the patient should likely have some type of compression on she told me that "she is not here for me to fix her fluid problems she is here for me to fix her wounds." With that being said I explained to the patient that there is not any way to do 1 without the other because the fluid is exacerbating the wound. She did not have much to say that to be honest. 06-08-2022 upon evaluation today patient appears to be doing well with regard to her legs. I do see signs of improvement she did keep the Tubigrip on and her and her husband have been doing this which is extremely good for her at this point. I do not see any signs of active infection which is great news and overall I think she is doing well in that regard. I do believe that keeping the edema under some control even if its not perfect is better than nothing. She just did not tolerate the compression wraps at all. 06-15-2022 upon evaluation today patient appears to be doing excellent in regard to her wound on each leg. She has been tolerating the dressing changes without complication and overall seems to be doing quite well. I do not see any signs of active infection locally or systemically at this point. 06-22-2022 upon evaluation today patient appears to be doing well currently in regard to her wound.  She has been tolerating the dressing changes without complication. Fortunately there does not appear to be any evidence of active infection at this time which is great news. No fevers, chills, Pe, Sarah T. ([id number]) nausea, vomiting, or diarrhea. 06-29-2022 upon evaluation today patient appears to be doing well currently in regard to her wounds. She is actually showing signs of significant improvement which is great news and overall I am extremely pleased with where we stand currently. There does not appear to be any evidence of active infection at this time. Electronic Signature(s) Signed: 06/29/2022 2:09:18 PM By: Worthy Keeler PA-C Entered By: Worthy Keeler on 06/29/2022 14:09:18 [name] ([id number]) -------------------------------------------------------------------------------- Physical Exam Details [name] Date of Service: 06/29/2022 1:15 PM Medical Record Number: [id number] Patient Account Number: [id number] Date of Birth/Sex: Mar 15, 1934 (87 y.o. F) Treating RN: Carlene Coria Primary Care Provider: Fulton Reek Other Clinician: Referring Provider: Fulton Reek Treating Provider/Extender: Skipper Cliche in Treatment: 5 Constitutional Well-nourished and well-hydrated in no acute distress. Respiratory normal breathing without difficulty. Psychiatric this patient is able to make decisions and demonstrates good insight into disease process. Alert and Oriented x 3. pleasant and cooperative. Notes Patient's wounds currently are showing signs of improvement for the most part. This blister on the side of her leg I believe is starting to show signs of improvement. However this is likely can open up into an actual wound before it heals completely. Electronic Signature(s) Signed: 06/29/2022 2:10:09 PM By: Worthy Keeler PA-C Entered By: Worthy Keeler on 06/29/2022 14:10:09 [name]  ([id number]) -------------------------------------------------------------------------------- Physician Orders Details [name] Date of Service: 06/29/2022 1:15 PM Medical Record Number: [id number] Patient Account Number: [id number] Date of Birth/Sex: 1934/02/10 (87 y.o. F) Treating RN: Carlene Coria Primary Care Provider: Fulton Reek Other Clinician: Referring Provider: Fulton Reek Treating  Provider/Extender: Jeri Cos Weeks in Treatment: 5 Verbal / Phone Orders: No Diagnosis Coding Follow-up Appointments o Return Appointment in 1 week. Bathing/ Shower/ Hygiene o May shower with wound dressing protected with water repellent cover or cast protector. Anesthetic (Use 'Patient Medications' Section for Anesthetic Order Entry) o Lidocaine applied to wound bed Edema Control - Lymphedema / Segmental Compressive Device / Other o Tubigrip single layer applied. - size D o Elevate, Exercise Daily and Avoid Standing for Long Periods of Time. o Elevate legs to the level of the heart and pump ankles as often as possible o Elevate leg(s) parallel to the floor when sitting. Wound Treatment Wound #7 - Lower Leg Wound Laterality: Right, Medial Cleanser: Byram Ancillary Kit - 15 Day Supply (Generic) 3 x Per Week/30 Days Discharge Instructions: Use supplies as instructed; Kit contains: (15) Saline Bullets; (15) 3x3 Gauze; 15 pr Gloves Cleanser: Soap and Water 3 x Per Week/30 Days Discharge Instructions: Gently cleanse wound with antibacterial soap, rinse and pat dry prior to dressing wounds Cleanser: Wound Cleanser 3 x Per Week/30 Days Discharge Instructions: Wash your hands with soap and water. Remove old dressing, discard into plastic bag and place into trash. Cleanse the wound with Wound Cleanser prior to applying a clean dressing using gauze sponges, not tissues or cotton balls. Do not scrub or use excessive force. Pat dry using gauze sponges, not tissue  or cotton balls. Primary Dressing: Silvercel 4 1/4x 4 1/4 (in/in) (Generic) 3 x Per Week/30 Days Discharge Instructions: Apply Silvercel 4 1/4x 4 1/4 (in/in) as instructed Secondary Dressing: Gauze (Generic) 3 x Per Week/30 Days Discharge Instructions: As directed: dry, Secured With: Tubigrip Size D, 3x10 (in/yd) 3 x Per Week/30 Days Wound #8 - Lower Leg Wound Laterality: Left, Lateral, Distal Cleanser: Byram Ancillary Kit - 15 Day Supply (Generic) 3 x Per Week/30 Days Discharge Instructions: Use supplies as instructed; Kit contains: (15) Saline Bullets; (15) 3x3 Gauze; 15 pr Gloves Cleanser: Soap and Water 3 x Per Week/30 Days Discharge Instructions: Gently cleanse wound with antibacterial soap, rinse and pat dry prior to dressing wounds Cleanser: Wound Cleanser 3 x Per Week/30 Days Discharge Instructions: Wash your hands with soap and water. Remove old dressing, discard into plastic bag and place into trash. Cleanse the wound with Wound Cleanser prior to applying a clean dressing using gauze sponges, not tissues or cotton balls. Do not scrub or use excessive force. Pat dry using gauze sponges, not tissue or cotton balls. Primary Dressing: Silvercel 4 1/4x 4 1/4 (in/in) (Generic) 3 x Per Week/30 Days Discharge Instructions: Apply Silvercel 4 1/4x 4 1/4 (in/in) as instructed Secondary Dressing: Gauze (Generic) 3 x Per Week/30 Days Discharge Instructions: As directed: dry, Secured With: Tubigrip Size D, 3x10 (in/yd) 3 x Per Week/30 Days [name], Sarah T. ([id number]) Wound #9 - Lower Leg Wound Laterality: Left, Proximal Cleanser: Byram Ancillary Kit - 15 Day Supply (Generic) 3 x Per Week/30 Days Discharge Instructions: Use supplies as instructed; Kit contains: (15) Saline Bullets; (15) 3x3 Gauze; 15 pr Gloves Cleanser: Soap and Water 3 x Per Week/30 Days Discharge Instructions: Gently cleanse wound with antibacterial soap, rinse and pat dry prior to dressing wounds Cleanser: Wound Cleanser  3 x Per Week/30 Days Discharge Instructions: Wash your hands with soap and water. Remove old dressing, discard into plastic bag and place into trash. Cleanse the wound with Wound Cleanser prior to applying a clean dressing using gauze sponges, not tissues or cotton balls. Do not scrub or use excessive force.  Pat dry using gauze sponges, not tissue or cotton balls. Primary Dressing: Silvercel 4 1/4x 4 1/4 (in/in) (Generic) 3 x Per Week/30 Days Discharge Instructions: Apply Silvercel 4 1/4x 4 1/4 (in/in) as instructed Secondary Dressing: Gauze (Generic) 3 x Per Week/30 Days Discharge Instructions: As directed: dry, Secured With: Tubigrip Size D, 3x10 (in/yd) 3 x Per Week/30 Days Electronic Signature(s) Signed: 06/29/2022 3:32:36 PM By: Worthy Keeler PA-C Signed: 06/30/2022 10:43:45 AM By: Carlene Coria RN Entered By: Carlene Coria on 06/29/2022 14:41:08 [name] ([id number]) -------------------------------------------------------------------------------- Problem List Details [name] Date of Service: 06/29/2022 1:15 PM Medical Record Number: [id number] Patient Account Number: [id number] Date of Birth/Sex: 1934-07-26 (88 y.o. F) Treating RN: Carlene Coria Primary Care Provider: Fulton Reek Other Clinician: Referring Provider: Fulton Reek Treating Provider/Extender: Skipper Cliche in Treatment: 5 Active Problems ICD-10 Encounter Code Description Active Date MDM Diagnosis I87.333 Chronic venous hypertension (idiopathic) with ulcer and inflammation of 05/23/2022 No Yes bilateral lower extremity L97.812 Non-pressure chronic ulcer of other part of right lower leg with fat layer 05/23/2022 No Yes exposed L97.822 Non-pressure chronic ulcer of other part of left lower leg with fat layer 05/23/2022 No Yes exposed [place] (primary) hypertension 05/23/2022 No Yes Z99.2 Dependence on renal dialysis 05/23/2022 No Yes I12.0 Hypertensive chronic kidney  disease with stage 5 chronic kidney disease 05/23/2022 No Yes or end stage renal disease Inactive Problems Resolved Problems Electronic Signature(s) Signed: 06/29/2022 1:34:27 PM By: Worthy Keeler PA-C Entered By: Worthy Keeler on 06/29/2022 13:34:27 [name] ([id number]) -------------------------------------------------------------------------------- Progress Note Details [name] Date of Service: 06/29/2022 1:15 PM Medical Record Number: [id number] Patient Account Number: [id number] Date of Birth/Sex: 23-Sep-1934 (87 y.o. F) Treating RN: Carlene Coria Primary Care Provider: Fulton Reek Other Clinician: Referring Provider: Fulton Reek Treating Provider/Extender: Skipper Cliche in Treatment: 5 Subjective Chief Complaint Information obtained from Patient Bilateral LE Ulcers History of Present Illness (HPI) ADMISSION 07/14/2020. This is a medically complex 88 year old woman who is sent over urgently from her primary care doctor Dr. Doy [facility] at [facility] and we worked her in this afternoon. She is apparently developed a blister on the dorsal foot sometime over the last 2 weeks on the right which seems to have developed a hemorrhagic component to this. The history here is somewhat vague but this is happened since she left the hospital on 07/02/2020. She also has a blistered area on the left dorsal foot although this does not have a hemorrhagic component to it and it is not threatened to open where is the right side is definitely leaking fluid and could easily break down. I do not believe she has been doing anything specific to this. With regards to her medical history I have reviewed the discharge summary from the hospital from 06/22/2020 through 07/02/2020. 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 She was given prednisone but was not felt to be a  candidate for immunosuppression secondary to her age and comorbidities. She is ANA positive p-ANCA positive. She had a right IJ catheter placed on 9/24 and is being done on dialysis. She is a diabetic. She takes prednisone 20 mg twice daily. She was on aspirin and Plavix although I think these have been put on hold. Past medical history includes; pauci-immune crescentic glomerulonephritis. History of peripheral edema and type 2 diabetes left carotid artery stenosis with a history of a CVA, coronary artery disease and  hypertension. She states that she tolerates dialysis reasonably poorly secondary to "butt cramps". She says that when she gets discomfort they stop her dialysis. This probably has something to do with her fluid overloaded state I cannot see any arterial evaluation in her record in [facility]. We could not do arterial ABIs on her because of complaints of discomfort 10/20; 1 week follow-up. This is a patient who is a diabetic. She recently developed acute renal failure because of pauci-immune glomerulonephritis. Sometime during her initial hospitalization she developed a wound on her right foot tremendous swelling in both legs. She was sent over here urgently last week by her primary care physician with what looks to be hematoma still contained on the right dorsal foot. We put silver alginate on this and put her in compression to control some of the swelling. The left leg had a similar appearance with severe edema in the left dorsal foot so we put that under compression as well. When she arrived back for nurse follow-up on Friday there was some drainage coming out of the wound which our nurse cultured. This is come back showing Pseudomonas Enterococcus faecalis and staph aureus although the identification of the staph aureus is not complete. I empirically put her on cefdinir 300 mg after dialysis on Tuesday Thursday and Saturday she is only had one dose. She has not been systemically  unwell 10/27 1 week follow-up. With considerable help of her nursing staff I was finally able to speak to her nephrologist who manages her at dialysis. Started her on vancomycin and ceftazidime. This to cover the combination of methicillin sensitive staph aureus Enterococcus faecalis and Pseudomonas. Although the staph aureus was methicillin sensitive and the Enterococcus ampicillin sensitive vancomycin provided the best alternative here that can be given at dialysis and ceftazidime to cover the Pseudomonas. She had a traumatic wound on the top of her foot and a complicated abscess. She has significant tissue destruction from this although the wound is looking a lot better today. We are using silver alginate to the surface. X-ray of the foot did not show any deep obvious infection or osteomyelitis 12/1; since the patient was last here she was hospitalized from 11/3 through 11/10 with coag negative staphylococcal bacteremia. I do not know the the exact cause of this was determined. She was seen by vascular and they replaced her permacath on the right side of her neck. Her repeat blood cultures were negative. She is still on IV vancomycin ando Ceftazidime at dialysis. She generally feels systemically well she does not have a fever she is still receiving dialysis but reports she voids up to 8 times a day She has home health changing the dressing I believe they are using silver alginate under 3 layer compression. In addition to the large wound on her right anterior foot that we initially saw her for she has apparently a right lateral leg wound that she suffered during a fall and 2 skin tears on the left anterior lower leg 12/8; patient comes in looking a lot better this week. One of the 2 skin tears she has on the left leg is healed the other is smaller. The area on the right lateral leg looks improved and her original wound on the right dorsal foot is a lot smaller and looks a lot better. We used  Hydrofera Blue to all wound areas under 3 layer compression.. Apparently home health had some trouble with the compression wraps. 12/15 patient comes in with all of her  wounds smaller including her original deep punched out wound on the right dorsal foot as well as the skin tears on the right posterior lateral calf and the left anterior lower leg. We have been using Hydrofera Blue under compression excellent results 10/12/2020 upon evaluation today patient appears to be doing decently well currently in regard to her foot ulcer. She does have a small hole still open with some tunneling that really goes proximally in the foot at around the 12:00 location. There is really not significant undermining other locations. This appears to be a small area of where there was a little fluid trapped. Nonetheless I do feel like this is still doing quite well and in general I think that she is going to benefit from possibly packing into this area with endoform in order to keep the area open while it granulates in. 11/09/2020 upon evaluation today patient appears to be doing really about the same in regard to her wound. There is a little reopening towards the distal portion of the wound bed but again I think this is just more as a result of the dressing possibly getting stuck even to little bit of the scar tissue nonetheless it also anything looks worse although I am not sure the endoform is really doing the job for her. I think we may want to try something a little different here I think packing with a silver alginate dressing may be optimal as compared to what we tried at this point. Is been Zywicki, Aryam T. ([id number]) almost a month since have seen her and we really have not seen dramatic improvement. 12/07/2020 upon evaluation today patient appears to be doing well with regard to the dorsal surface of her foot. Overall I feel like this looks to be completely healed today and this is excellent news. There is no  evidence of active infection which is great news. No fevers, chills, nausea, vomiting, or diarrhea. 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 We managed to heal her out. As I remember she had severe bilateral lower extremity edema secondary to acute on chronic renal failure. She arrives in clinic this time with a wound on her distal left dorsal foot that is been there for about 6 months. Completely eschar covered she has been treating this with Neosporin. She said she is on IV vancomycin and cefepime at dialysis although we have no independent verification of this. This is due to be finished on 01/30/2022. She says it was for this foot but she does not say that she has had x-rays and MRI etc. Past medical history is extensive; she has type 2 diabetes although not currently in any treatment, end-stage renal disease on dialysis Monday Wednesday and Friday, coronary artery disease status post CABG, combined systolic and diastolic heart failure, peripheral vascular disease, cerebrovascular disease. Carotid artery stenosis, COPD, breast cancer ABI in our clinic was 0.79. I cannot find that she has had previous noninvasive arterial studies. She is only able to walk around the house does not describe claudication that I can elicit Upon inspection patient's wounds actually appear to be doing decently well. On the left foot this is mainly dry drainage, bleeding, and Iodoflex which is stuck on the surface of the wound I was able to get that off mechanically she did not want any sharp  debridement whatsoever fortunately is able to get it off mechanically without using that just using saline and gauze. Subsequently I think this is otherwise doing quite well. On the right leg she does have swelling and she had a blister  that popped up this feels like it almost completely sealed down but it still weeping and leaking a little bit I think we need to manage that as well to be honest. 02-14-2022 upon evaluation today patient appears to be doing well currently in regard to the wound on her foot. She has been tolerating the dressing changes there is a little bit of film buildup here in the patient last week was very resistant to debridement because her ABIs were somewhat low I was avoiding that as well. However she did see Dr. Lazaro Arms and he apparently told her that her ABIs were good everything appeared to be fine and to be honest I think this is excellent news. I am very pleased to hear this and I do believe doing some sharp debridement would be helpful at this point as well. 02-21-2022 upon evaluation today patient appears to be doing well with regard to her wounds. Both are showing signs of improvement this is great news. Fortunately I do not see any evidence of active infection locally or systemically at this time which is great news. No fevers, chills, nausea, vomiting, or diarrhea. 03-02-2022 upon evaluation today patient appears to be doing better in regard to her right leg which appears to be healed. In regards to her left leg this on the foot at the top still has a small opening at this time. Fortunately there does not appear to be any signs of infection. With that being said I do think we may want to switch over to collagen based dressing to see if we get this to feeling a little bit better. 03-09-2022 upon evaluation today patient appears to be doing well currently in regard to her wound on the top of her left foot. I am very pleased with where things stand and I think we are headed in the right direction here. She is still having a lot of trouble with the compression wrap. She is doing extremely well on the right side with the Tubigrip so I think we can probably switch over to using the Tubigrip on the left side as  well especially since the collagen is getting so dry I do not believe this is doing her as much good as I would like for her to to be honest. 03-23-2022 upon evaluation today patient appears to be doing well with regard to her foot ulcer. This is pretty much healed at this point. Fortunately I see no signs of infection which is great news. The biggest issue I think is good to be keeping her edema under control. The Tubigrip is not very tight but unfortunately rolls down the cuts and on the top of her leg I think she needs something to try to help keep this under better control more evenly she has done well she tells me in the past with an Ace wrap I think we can probably give that a shot. Readmission: 05-23-2022 upon evaluation today patient presents for reevaluation here in the clinic she was last seen June 23, 2022. At that time we got her compression wraps with juxta lites. Unfortunately she did not wear these appropriately she tells me that they were unable to get them on. I am not really sure why that is and to be peripherally  honest I think that this to some degree is just a matter of they let her legs get too swollen and out-of-control and then subsequently ended up with it getting to the point that the juxta lites will be been fit. Either way based on what I am seeing at this time I do believe that the patient has been required compression here in the office to get this under control I think the 3 layer compression wraps probably the right way to go she is not going to tolerate anything too tight and the 4-layer I think would be much too tight for her. Patient's past medical history really has not changed she does have hypertension, end-stage renal disease, and she is on renal dialysis. Unfortunately they are not able to remove so much fluid as it throws her entire electrolyte imbalance that subsequently causes extreme cramping. 05-30-2022 upon evaluation today patient appears to be doing  about the same in regard to her wounds. She needs to have some compression but she took off the 3 layer compression wrap she tells me that it was about to make a fall several times and she could not continue to deal with that. She is states that there is no way she can afford to break anything which I completely understand. With that being said I do believe that the patient should likely have some type of compression on she told me that "she is not here for me to fix her fluid problems she is here for me to fix her wounds." With that being said I explained to the patient that there is not any way to do 1 without the other because the fluid is exacerbating the wound. She did not have much to say that to be honest. 06-08-2022 upon evaluation today patient appears to be doing well with regard to her legs. I do see signs of improvement she did keep the Tubigrip on and her and her husband have been doing this which is extremely good for her at this point. I do not see any signs of active infection which is great news and overall I think she is doing well in that regard. I do believe that keeping the edema under some control even if its not perfect is better than nothing. She just did not tolerate the compression wraps at all. 06-15-2022 upon evaluation today patient appears to be doing excellent in regard to her wound on each leg. She has been tolerating the dressing [name], Sarah T. ([id number]) changes without complication and overall seems to be doing quite well. I do not see any signs of active infection locally or systemically at this point. 06-22-2022 upon evaluation today patient appears to be doing well currently in regard to her wound. She has been tolerating the dressing changes without complication. Fortunately there does not appear to be any evidence of active infection at this time which is great news. No fevers, chills, nausea, vomiting, or diarrhea. 06-29-2022 upon evaluation today patient  appears to be doing well currently in regard to her wounds. She is actually showing signs of significant improvement which is great news and overall I am extremely pleased with where we stand currently. There does not appear to be any evidence of active infection at this time. Objective Constitutional Well-nourished and well-hydrated in no acute distress. Vitals Time Taken: 1:20 PM, Height: 64 in, Weight: 166 lbs, BMI: 28.5, Temperature: 97.8 F, Pulse: 79 bpm, Respiratory Rate: 18 breaths/min, Blood Pressure: 150/75 mmHg. Respiratory normal breathing without difficulty.  Psychiatric this patient is able to make decisions and demonstrates good insight into disease process. Alert and Oriented x 3. pleasant and cooperative. General Notes: Patient's wounds currently are showing signs of improvement for the most part. This blister on the side of her leg I believe is starting to show signs of improvement. However this is likely can open up into an actual wound before it heals completely. Integumentary (Hair, Skin) Wound #7 status is Open. Original cause of wound was Trauma. The date acquired was: 04/19/2022. The wound has been in treatment 5 weeks. The wound is located on the Right,Medial Lower Leg. The wound measures 0.8cm length x 4cm width x 0.1cm depth; 2.513cm^2 area and 0.251cm^3 volume. There is Fat Layer (Subcutaneous Tissue) exposed. There is no tunneling or undermining noted. There is a medium amount of serosanguineous drainage noted. There is medium (34-66%) granulation within the wound bed. There is a medium (34-66%) amount of necrotic tissue within the wound bed including Adherent Slough. Wound #8 status is Open. Original cause of wound was Trauma. The date acquired was: 04/24/2022. The wound has been in treatment 5 weeks. The wound is located on the Left,Distal,Lateral Lower Leg. The wound measures 0.6cm length x 1.5cm width x 0.1cm depth; 0.707cm^2 area and 0.071cm^3 volume. There is  Fat Layer (Subcutaneous Tissue) exposed. There is no tunneling or undermining noted. There is a medium amount of serosanguineous drainage noted. There is large (67-100%) red granulation within the wound bed. There is a small (1-33%) amount of necrotic tissue within the wound bed including Adherent Slough. Wound #9 status is Open. Original cause of wound was Pressure Injury. The date acquired was: 06/19/2022. The wound has been in treatment 1 weeks. The wound is located on the Left,Proximal Lower Leg. The wound measures 0.1cm length x 0.1cm width x 0.1cm depth; 0.008cm^2 area and 0.001cm^3 volume. There is no tunneling or undermining noted. There is a none present amount of drainage noted. There is no granulation within the wound bed. There is a large (67-100%) amount of necrotic tissue within the wound bed including Adherent Slough. Assessment Active Problems ICD-10 Chronic venous hypertension (idiopathic) with ulcer and inflammation of bilateral lower extremity Non-pressure chronic ulcer of other part of right lower leg with fat layer exposed Non-pressure chronic ulcer of other part of left lower leg with fat layer exposed Essential (primary) hypertension Dependence on renal dialysis Hypertensive chronic kidney disease with stage 5 chronic kidney disease or end stage renal disease [name], Sarah T. ([id number]) Plan Follow-up Appointments: Return Appointment in 1 week. Bathing/ Shower/ Hygiene: May shower with wound dressing protected with water repellent cover or cast protector. Anesthetic (Use 'Patient Medications' Section for Anesthetic Order Entry): Lidocaine applied to wound bed Edema Control - Lymphedema / Segmental Compressive Device / Other: Optional: One layer of unna paste to top of compression wrap (to act as an anchor). Tubigrip single layer applied. - size D Elevate, Exercise Daily and Avoid Standing for Long Periods of Time. Elevate legs to the level of the heart and pump  ankles as often as possible Elevate leg(s) parallel to the floor when sitting. WOUND #7: - Lower Leg Wound Laterality: Right, Medial Cleanser: Byram Ancillary Kit - 15 Day Supply (Generic) 3 x Per Week/30 Days Discharge Instructions: Use supplies as instructed; Kit contains: (15) Saline Bullets; (15) 3x3 Gauze; 15 pr Gloves Cleanser: Soap and Water 3 x Per Week/30 Days Discharge Instructions: Gently cleanse wound with antibacterial soap, rinse and pat dry prior to dressing wounds Cleanser:  Wound Cleanser 3 x Per Week/30 Days Discharge Instructions: Wash your hands with soap and water. Remove old dressing, discard into plastic bag and place into trash. Cleanse the wound with Wound Cleanser prior to applying a clean dressing using gauze sponges, not tissues or cotton balls. Do not scrub or use excessive force. Pat dry using gauze sponges, not tissue or cotton balls. Primary Dressing: Silvercel 4 1/4x 4 1/4 (in/in) (Generic) 3 x Per Week/30 Days Discharge Instructions: Apply Silvercel 4 1/4x 4 1/4 (in/in) as instructed Secondary Dressing: Gauze (Generic) 3 x Per Week/30 Days Discharge Instructions: As directed: dry, Secured With: Tubigrip Size D, 3x10 (in/yd) 3 x Per Week/30 Days WOUND #8: - Lower Leg Wound Laterality: Left, Lateral, Distal Cleanser: Byram Ancillary Kit - 15 Day Supply (Generic) 3 x Per Week/30 Days Discharge Instructions: Use supplies as instructed; Kit contains: (15) Saline Bullets; (15) 3x3 Gauze; 15 pr Gloves Cleanser: Soap and Water 3 x Per Week/30 Days Discharge Instructions: Gently cleanse wound with antibacterial soap, rinse and pat dry prior to dressing wounds Cleanser: Wound Cleanser 3 x Per Week/30 Days Discharge Instructions: Wash your hands with soap and water. Remove old dressing, discard into plastic bag and place into trash. Cleanse the wound with Wound Cleanser prior to applying a clean dressing using gauze sponges, not tissues or cotton balls. Do not scrub or  use excessive force. Pat dry using gauze sponges, not tissue or cotton balls. Primary Dressing: Silvercel 4 1/4x 4 1/4 (in/in) (Generic) 3 x Per Week/30 Days Discharge Instructions: Apply Silvercel 4 1/4x 4 1/4 (in/in) as instructed Secondary Dressing: Gauze (Generic) 3 x Per Week/30 Days Discharge Instructions: As directed: dry, Secured With: Tubigrip Size D, 3x10 (in/yd) 3 x Per Week/30 Days WOUND #9: - Lower Leg Wound Laterality: Left, Proximal Cleanser: Byram Ancillary Kit - 15 Day Supply (Generic) 3 x Per Week/30 Days Discharge Instructions: Use supplies as instructed; Kit contains: (15) Saline Bullets; (15) 3x3 Gauze; 15 pr Gloves Cleanser: Soap and Water 3 x Per Week/30 Days Discharge Instructions: Gently cleanse wound with antibacterial soap, rinse and pat dry prior to dressing wounds Cleanser: Wound Cleanser 3 x Per Week/30 Days Discharge Instructions: Wash your hands with soap and water. Remove old dressing, discard into plastic bag and place into trash. Cleanse the wound with Wound Cleanser prior to applying a clean dressing using gauze sponges, not tissues or cotton balls. Do not scrub or use excessive force. Pat dry using gauze sponges, not tissue or cotton balls. Primary Dressing: Silvercel 4 1/4x 4 1/4 (in/in) (Generic) 3 x Per Week/30 Days Discharge Instructions: Apply Silvercel 4 1/4x 4 1/4 (in/in) as instructed Secondary Dressing: Gauze (Generic) 3 x Per Week/30 Days Discharge Instructions: As directed: dry, Secured With: Tubigrip Size D, 3x10 (in/yd) 3 x Per Week/30 Days 1. I am going to suggest that we go ahead and have the patient continue with the wound care measures as before we are using the silver alginate dressing which I think is doing quite well. 2. I am also can recommend the patient should continue to monitor for any evidence of worsening or infection. Obviously if she has any concerns she should contact the office and let me know. We are continuing with the  Tubigrip which is doing a good job. We will see patient back for reevaluation in 1 week here in the clinic. If anything worsens or changes patient will contact our office for additional recommendations. Electronic Signature(s) Signed: 06/29/2022 2:10:41 PM By: Worthy Keeler PA-C  Entered By: Worthy Keeler on 06/29/2022 14:10:41 [name] ([id number]) -------------------------------------------------------------------------------- SuperBill Details [name] Date of Service: 06/29/2022 Medical Record Number: [id number] Patient Account Number: [id number] Date of Birth/Sex: [birth year] (87 y.o. F) Treating RN: Carlene Coria Primary Care Provider: Fulton Reek Other Clinician: Referring Provider: Fulton Reek Treating Provider/Extender: Skipper Cliche in Treatment: 5 Diagnosis Coding ICD-10 Codes Code Description [phone] Chronic venous hypertension (idiopathic) with ulcer and inflammation of bilateral lower extremity L97.812 Non-pressure chronic ulcer of other part of right lower leg with fat layer exposed L97.822 Non-pressure chronic ulcer of other part of left lower leg with fat layer exposed I10 Essential (primary) hypertension Z99.2 Dependence on renal dialysis I12.0 Hypertensive chronic kidney disease with stage 5 chronic kidney disease or end stage renal disease Facility Procedures CPT4 Code: [id number] Description: 99213 - WOUND CARE VISIT-LEV 3 EST PT Modifier: Quantity: 1 Physician Procedures CPT4 Code Description: [id number] - WC PHYS LEVEL 3 - EST PT Modifier: Quantity: 1 CPT4 Code Description: ICD-10 Diagnosis Description I87.333 Chronic venous hypertension (idiopathic) with ulcer and inflammation of L97.812 Non-pressure chronic ulcer of other part of right lower leg with fat lay L97.822 Non-pressure chronic ulcer of  other part of left lower leg with fat laye I10 Essential (primary) hypertension Modifier: bilateral lower extre er  exposed r exposed Quantity: Art gallery manager Signature(s) Signed: 06/29/2022 2:10:54 PM By: Worthy Keeler PA-C Entered By: Worthy Keeler on 06/29/2022 14:10:54

## 2022-06-30 NOTE — Progress Notes (Signed)
ARTHEA, NOBEL ([id number]) Visit Report for 06/29/2022 Arrival Information Details [name], [name] Date of Service: 06/29/2022 1:15 PM Medical Record Number: [id number] Patient Account Number: [id number] Date of Birth/Sex: April 01, 1934 (88 y.o. F) Treating RN: Carlene Coria Primary Care [place] Lebeau: Fulton Reek Other Clinician: Referring Dylyn Mclaren: Fulton Reek Treating Ruther Ephraim/Extender: Skipper Cliche in Treatment: 5 Visit Information History Since Last Visit All ordered tests and consults were completed: No Patient Arrived: Wheel Chair Added or deleted any medications: No Arrival Time: 13:15 Any new allergies or adverse reactions: No Accompanied By: husband Had a fall or experienced change in No Transfer Assistance: None activities of daily living that may affect Patient Identification Verified: Yes risk of falls: Secondary Verification Process Completed: Yes Signs or symptoms of abuse/neglect since last visito No Patient Requires Transmission-Based No Hospitalized since last visit: No Precautions: Implantable device outside of the clinic excluding No Patient Has Alerts: Yes cellular tissue based products placed in the center Patient Alerts: Patient on Blood since last visit: Thinner Has Dressing in Place as Prescribed: Yes DIABETIC Has Compression in Place as Prescribed: Yes ABI LEFT .79 02/14/22 ABI RIGHT 1.02 Pain Present Now: No 02/14/22 Electronic Signature(s) Signed: 06/30/2022 10:43:45 AM By: Carlene Coria RN Entered By: Carlene Coria on 06/29/2022 13:20:49 [name] ([id number]) -------------------------------------------------------------------------------- Clinic Level of Care Assessment Details [name] Date of Service: 06/29/2022 1:15 PM Medical Record Number: [id number] Patient Account Number: [id number] Date of Birth/Sex: 1934/04/10 (87 y.o. F) Treating RN: Carlene Coria Primary Care Gwynn Crossley: Fulton Reek Other Clinician: Referring Tiyana Galla: Fulton Reek Treating Shlomo Seres/Extender: Skipper Cliche in Treatment: 5 Clinic Level of Care Assessment Items TOOL 4 Quantity Score X - Use when only an EandM is performed on FOLLOW-UP visit 1 0 ASSESSMENTS - Nursing Assessment / Reassessment X - Reassessment of Co-morbidities (includes updates in patient status) 1 10 X- 1 5 Reassessment of Adherence to Treatment Plan ASSESSMENTS - Wound and Skin Assessment / Reassessment X - Simple Wound Assessment / Reassessment - one wound 1 5 _0  - 0 Complex Wound Assessment / Reassessment - multiple wounds _1  - 0 Dermatologic / Skin Assessment (not related to wound area) ASSESSMENTS - Focused Assessment _2  - Circumferential Edema Measurements - multi extremities 0 _3  - 0 Nutritional Assessment / Counseling / Intervention _4  - 0 Lower Extremity Assessment (monofilament, tuning fork, pulses) _5  - 0 Peripheral Arterial Disease Assessment (using hand held doppler) ASSESSMENTS - Ostomy and/or Continence Assessment and Care _6  - Incontinence Assessment and Management 0 _7  - 0 Ostomy Care Assessment and Management (repouching, etc.) PROCESS - Coordination of Care X - Simple Patient / Family Education for ongoing care 1 15 _8  - 0 Complex (extensive) Patient / Family Education for ongoing care _9  - 0 Staff obtains Programmer, systems, Records, Test Results / Process Orders _10  - 0 Staff telephones HHA, Nursing Homes / Clarify orders / etc _11  - 0 Routine Transfer to another Facility (non-emergent condition) _12  - 0 Routine Hospital Admission (non-emergent condition) _13  - 0 New Admissions / Biomedical engineer / Ordering NPWT, Apligraf, etc. _14  - 0 Emergency Hospital Admission (emergent condition) X- 1 10 Simple Discharge Coordination _15  - 0 Complex (extensive) Discharge Coordination PROCESS - Special Needs _16  - Pediatric / Minor Patient Management 0 _17  - 0 Isolation Patient Management _18  -  0 Hearing / Language / Visual special needs _19  - 0 Assessment of Community assistance (transportation, D/C planning, etc.) _20  - 0 Additional assistance / Altered mentation _21  - 0 Support  Surface(s) Assessment (bed, cushion, seat, etc.) INTERVENTIONS - Wound Cleansing / Measurement Reznik, Danyella T. ([id number]) _0  - 0 Simple Wound Cleansing - one wound X- 3 5 Complex Wound Cleansing - multiple wounds X- 1 5 Wound Imaging (photographs - any number of wounds) _1  - 0 Wound Tracing (instead of photographs) _2  - 0 Simple Wound Measurement - one wound X- 3 5 Complex Wound Measurement - multiple wounds INTERVENTIONS - Wound Dressings X - Small Wound Dressing one or multiple wounds 3 10 _3  - 0 Medium Wound Dressing one or multiple wounds _4  - 0 Large Wound Dressing one or multiple wounds <HOZYYQMGNOIBBCWU>_8<\/[id number]>_3  - 0 Application of Medications - topical <KJZPHXTAVWPVXYIA>_1<\/KPVVZSMOLMBEMLJQ>_4  - 0 Application of Medications - injection INTERVENTIONS - Miscellaneous _7  - External ear exam 0 _8  - 0 Specimen Collection (cultures, biopsies, blood, body fluids, etc.) _9  - 0 Specimen(s) / Culture(s) sent or taken to Lab for analysis _10  - 0 Patient Transfer (multiple staff / Civil Service fast streamer / Similar devices) _11  - 0 Simple Staple / Suture removal (25 or less) _12  - 0 Complex Staple / Suture removal (26 or more) _13  - 0 Hypo / Hyperglycemic Management (close monitor of Blood Glucose) _14  - 0 Ankle / Brachial Index (ABI) - do not check if billed separately X- 1 5 Vital Signs Has the patient been seen at the hospital within the last three years: Yes Total Score: 115 Level Of Care: New/Established - Level 3 Electronic Signature(s) Signed: 06/30/2022 10:43:45 AM By: Carlene Coria RN Entered By: Carlene Coria on 06/29/2022 13:42:52 [name] ([id number]) -------------------------------------------------------------------------------- Encounter Discharge Information Details [name] Date of Service: 06/29/2022 1:15  PM Medical Record Number: [id number] Patient Account Number: [id number] Date of Birth/Sex: [birth year] (88 y.o. F) Treating RN: Carlene Coria Primary Care Adain Geurin: Fulton Reek Other Clinician: Referring Bueford Arp: Fulton Reek Treating Elzabeth Mcquerry/Extender: Skipper Cliche in Treatment: 5 Encounter Discharge Information Items Discharge Condition: Stable Ambulatory Status: Wheelchair Discharge Destination: Home Transportation: Private Auto Accompanied By: husband Schedule Follow-up Appointment: Yes Clinical Summary of Care: Electronic Signature(s) Signed: 06/30/2022 10:43:45 AM By: Carlene Coria RN Entered By: Carlene Coria on 06/29/2022 13:43:44 [name] ([id number]) -------------------------------------------------------------------------------- Lower Extremity Assessment Details [name] Date of Service: 06/29/2022 1:15 PM Medical Record Number: [id number] Patient Account Number: [id number] Date of Birth/Sex: [birth year] (87 y.o. F) Treating RN: Carlene Coria Primary Care Raniya Golembeski: Fulton Reek Other Clinician: Referring Jezreel Sisk: Fulton Reek Treating Rhian Funari/Extender: Skipper Cliche in Treatment: 5 Edema Assessment Assessed: [Left: No] [Right: No] [Left: Edema] [Right: :] Calf Left: Right: Point of Measurement: 32 cm From Medial Instep 40 cm 41 cm Ankle Left: Right: Point of Measurement: 10 cm From Medial Instep 30 cm 32 cm Vascular Assessment Pulses: Dorsalis Pedis Palpable: [Left:Yes] [Right:Yes] Electronic Signature(s) Signed: 06/30/2022 10:43:45 AM By: Carlene Coria RN Entered By: Carlene Coria on 06/29/2022 13:30:27 [name] ([id number]) -------------------------------------------------------------------------------- Multi Wound Chart Details [name] Date of Service: 06/29/2022 1:15 PM Medical Record Number: [id number] Patient Account Number: [id number] Date of Birth/Sex: 04/27/1934 (88  y.o. 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 Extremity Extremity Comorbid History: Congestive Heart Failure, Congestive Heart Failure, Congestive Heart Failure, Hypertension, Type II Diabetes, Hypertension, Type II Diabetes, Hypertension, Type II Diabetes, Received Radiation Received Radiation Received Radiation Date Acquired: 04/19/2022 04/24/2022 06/19/2022 Weeks of Treatment: _0 Wound Status: Open Open Open Wound Recurrence: No No No Measurements L x W x D (cm) 0.8x4x0.1 0.6x1.5x0.1 0.1x0.1x0.1 Area (cm) : 2.513 0.707 0.008 Volume (cm) : 0.251 0.071 0.001 % Reduction in Area: 74.20% 70.00% 97.20% % Reduction in Volume: 87.10% 69.90% 96.40% Classification: Grade 2 Grade 2 Full Thickness Without Exposed Support Structures Exudate Amount: Medium Medium None Present Exudate Type: Serosanguineous Serosanguineous N/A Exudate Color: red, brown red, brown N/A Granulation Amount: Medium (34-66%) Large (67-100%) None Present (0%) Granulation Quality: N/A Red N/A Necrotic Amount: Medium (34-66%) Small (1-33%) Large (67-100%) Exposed Structures: Fat Layer (Subcutaneous Tissue): Fat Layer (Subcutaneous Tissue): Fascia: No Yes Yes Fat Layer (Subcutaneous Tissue): Fascia: No Fascia: No No Tendon: No Tendon: No Tendon: No Muscle: No Muscle: No Muscle: No Joint: No Joint: No Joint: No Bone: No Bone: No Bone:  No Epithelialization: None None Large (67-100%) Treatment Notes Electronic Signature(s) Signed: 06/30/2022 10:43:45 AM By: Carlene Coria RN Entered By: Carlene Coria on 06/29/2022 13:31:53 [name] ([id number]) -------------------------------------------------------------------------------- Multi-Disciplinary Care Plan Details [name] Date of Service: 06/29/2022 1:15 PM Medical Record Number: [id number] Patient Account Number: [id number] Date of Birth/Sex: [birth year] (88 y.o. F) Treating RN: Carlene Coria Primary Care Alic Hilburn: Fulton Reek Other Clinician: Referring Ka Flammer: Fulton Reek Treating Javon Hupfer/Extender: Skipper Cliche in Treatment: 5 Active Inactive Wound/Skin Impairment Nursing Diagnoses: Knowledge deficit related to ulceration/compromised skin integrity Goals: Patient/caregiver will verbalize understanding of skin care regimen Date Initiated: 05/23/2022 Target Resolution Date: 07/23/2022 Goal Status: Active Ulcer/skin breakdown will have a volume reduction of 30% by week 4 Date Initiated: 05/23/2022 Date Inactivated: 06/29/2022 Target Resolution Date: 06/23/2022 Goal Status: Unmet Unmet Reason: comorbdities Ulcer/skin breakdown will have a volume reduction of 50% by week 8 Date Initiated: 05/23/2022 Target Resolution Date: 07/23/2022 Goal Status: Active Ulcer/skin breakdown will have a volume reduction of 80% by week 12 Date Initiated: 05/23/2022 Target Resolution Date: 08/23/2022 Goal Status: Active Ulcer/skin breakdown will heal within 14 weeks Date Initiated: 05/23/2022 Target Resolution Date: 09/22/2022 Goal Status: Active Interventions: Assess patient/caregiver ability to obtain necessary supplies Assess patient/caregiver ability to perform ulcer/skin care regimen upon admission and as needed Assess ulceration(s) every visit Notes: Electronic Signature(s) Signed: 06/30/2022 10:43:45 AM By: Carlene Coria RN Entered  By: Carlene Coria on 06/29/2022 13:31:38 [name] ([id number]) -------------------------------------------------------------------------------- Pain Assessment Details [name] Date of Service: 06/29/2022 1:15 PM Medical Record Number: [id number] Patient Account Number: [id number] Date of Birth/Sex: 01/31/1934 (88 y.o. F) Treating RN: Carlene Coria Primary Care Dickey Caamano: Fulton Reek Other Clinician: Referring Grahm Etsitty: Fulton Reek Treating Nikaela Coyne/Extender: Skipper Cliche in Treatment: 5 Active Problems Location of Pain Severity and Description of Pain Patient Has Paino No Site Locations Pain Management and Medication Current Pain Management: Electronic Signature(s) Signed: 06/30/2022 10:43:45 AM By: Carlene Coria RN Entered By: Carlene Coria on 06/29/2022 13:21:37 [name] ([id number]) -------------------------------------------------------------------------------- Patient/Caregiver Education Details [name] Date of Service: 06/29/2022 1:15 PM Medical Record Number: [id number] Patient Account Number: [id number] Date of Birth/Gender: 15-Nov-1933 (88 y.o. F) Treating RN: Carlene Coria Primary Care Physician: Fulton Reek Other Clinician: Referring Physician: Fulton Reek Treating Physician/Extender: Skipper Cliche in Treatment: 5 Education Assessment Education Provided To: Patient  Education Topics Provided Wound/Skin Impairment: Methods: Explain/Verbal Responses: State content correctly Electronic Signature(s) Signed: 06/30/2022 10:43:45 AM By: Carlene Coria RN Entered By: Carlene Coria on 06/29/2022 13:43:07 [name] ([id number]) -------------------------------------------------------------------------------- Wound Assessment Details [name] Date of Service: 06/29/2022 1:15 PM Medical Record Number: [id number] Patient Account Number: [id number] Date of  Birth/Sex: Feb 14, 1934 (87 y.o. F) Treating RN: Carlene Coria Primary Care Nazirah Tri: Fulton Reek Other Clinician: Referring Fusako Tanabe: Fulton Reek Treating Derrall Hicks/Extender: Skipper Cliche in Treatment: 5 Wound Status Wound Number: 7 Primary Diabetic Wound/Ulcer of the Lower Extremity Etiology: Wound Location: Right, Medial Lower Leg Wound Status: Open Wounding Event: Trauma Comorbid Congestive Heart Failure, Hypertension, Type II Date Acquired: 04/19/2022 History: Diabetes, Received Radiation Weeks Of Treatment: 5 Clustered Wound: No Photos Wound Measurements Length: (cm) 0.8 Width: (cm) 4 Depth: (cm) 0.1 Area: (cm) 2.513 Volume: (cm) 0.251 % Reduction in Area: 74.2% % Reduction in Volume: 87.1% Epithelialization: None Tunneling: No Undermining: No Wound Description Classification: Grade 2 Exudate Amount: Medium Exudate Type: Serosanguineous Exudate Color: red, brown Foul Odor After Cleansing: No Slough/Fibrino Yes Wound Bed Granulation Amount: Medium (34-66%) Exposed Structure Necrotic Amount: Medium (34-66%) Fascia Exposed: No Necrotic Quality: Adherent Slough Fat Layer (Subcutaneous Tissue) Exposed: Yes Tendon Exposed: No Muscle Exposed: No Joint Exposed: No Bone Exposed: No Treatment Notes Wound #7 (Lower Leg) Wound Laterality: Right, Medial Cleanser Byram Ancillary Kit - 15 Day Supply Discharge Instruction: Use supplies as instructed; Kit contains: (15) Saline Bullets; (15) 3x3 Gauze; 15 pr Gloves Soap and Water Discharge Instruction: Gently cleanse wound with antibacterial soap, rinse and pat dry prior to dressing wounds Dalzell, Yaritzi T. ([id number]) Wound Cleanser Discharge Instruction: Wash your hands with soap and water. Remove old dressing, discard into plastic bag and place into trash. Cleanse the wound with Wound Cleanser prior to applying a clean dressing using gauze sponges, not tissues or cotton balls. Do not scrub or use excessive  force. Pat dry using gauze sponges, not tissue or cotton balls. Peri-Wound Care Topical Primary Dressing Silvercel 4 1/4x 4 1/4 (in/in) Discharge Instruction: Apply Silvercel 4 1/4x 4 1/4 (in/in) as instructed Secondary Dressing Gauze Discharge Instruction: As directed: dry, Secured With Tubigrip Size D, 3x10 (in/yd) Compression Wrap Compression Stockings Add-Ons Electronic Signature(s) Signed: 06/30/2022 10:43:45 AM By: Carlene Coria RN Entered By: Carlene Coria on 06/29/2022 13:28:01 [name] ([id number]) -------------------------------------------------------------------------------- Wound Assessment Details [name] Date of Service: 06/29/2022 1:15 PM Medical Record Number: [id number] Patient Account Number: [id number] Date of Birth/Sex: 23-Jul-1934 (88 y.o. F) Treating RN: Carlene Coria Primary Care Dunya Meiners: Fulton Reek Other Clinician: Referring Catalina Salasar: Fulton Reek Treating Kerman Pfost/Extender: Skipper Cliche in Treatment: 5 Wound Status Wound Number: 8 Primary Diabetic Wound/Ulcer of the Lower Extremity Etiology: Wound Location: Left, Distal, Lateral Lower Leg Wound Status: Open Wounding Event: Trauma Comorbid Congestive Heart Failure, Hypertension, Type II Date Acquired: 04/24/2022 History: Diabetes, Received Radiation Weeks Of Treatment: 5 Clustered Wound: No Photos Wound Measurements Length: (cm) 0.6 Width: (cm) 1.5 Depth: (cm) 0.1 Area: (cm) 0.707 Volume: (cm) 0.071 % Reduction in Area: 70% % Reduction in Volume: 69.9% Epithelialization: None Tunneling: No Undermining: No Wound Description Classification: Grade 2 Exudate Amount: Medium Exudate Type: Serosanguineous Exudate Color: red, brown Foul Odor After Cleansing: No Slough/Fibrino Yes Wound Bed Granulation Amount: Large (67-100%) Exposed Structure Granulation Quality: Red Fascia Exposed: No Necrotic Amount: Small (1-33%) Fat Layer (Subcutaneous  Tissue) Exposed: Yes Necrotic Quality: Adherent Slough Tendon Exposed: No Muscle Exposed: No Joint Exposed: No  Bone Exposed: No Treatment Notes Wound #8 (Lower Leg) Wound Laterality: Left, Lateral, Distal Cleanser Byram Ancillary Kit - 15 Day Supply Discharge Instruction: Use supplies as instructed; Kit contains: (15) Saline Bullets; (15) 3x3 Gauze; 15 pr Gloves Soap and Water Discharge Instruction: Gently cleanse wound with antibacterial soap, rinse and pat dry prior to dressing wounds Amundson, Shabana TMarland Kitchen ([id number]) Wound Cleanser Discharge Instruction: Wash your hands with soap and water. Remove old dressing, discard into plastic bag and place into trash. Cleanse the wound with Wound Cleanser prior to applying a clean dressing using gauze sponges, not tissues or cotton balls. Do not scrub or use excessive force. Pat dry using gauze sponges, not tissue or cotton balls. Peri-Wound Care Topical Primary Dressing Silvercel 4 1/4x 4 1/4 (in/in) Discharge Instruction: Apply Silvercel 4 1/4x 4 1/4 (in/in) as instructed Secondary Dressing Gauze Discharge Instruction: As directed: dry, Secured With Tubigrip Size D, 3x10 (in/yd) Compression Wrap Compression Stockings Add-Ons Electronic Signature(s) Signed: 06/30/2022 10:43:45 AM By: Carlene Coria RN Entered By: Carlene Coria on 06/29/2022 13:29:10 [name] ([id number]) -------------------------------------------------------------------------------- Wound Assessment Details [name] Date of Service: 06/29/2022 1:15 PM Medical Record Number: [id number] Patient Account Number: [id number] Date of Birth/Sex: 13-Sep-1934 (88 y.o. F) Treating RN: Carlene Coria Primary Care Sakoya Win: Fulton Reek Other Clinician: Referring Caralyn Twining: Fulton Reek Treating Bryer Cozzolino/Extender: Skipper Cliche in Treatment: 5 Wound Status Wound Number: 9 Primary Venous Leg Ulcer Etiology: Wound Location: Left, Proximal  Lower Leg Wound Status: Open Wounding Event: Pressure Injury Comorbid Congestive Heart Failure, Hypertension, Type II Date Acquired: 06/19/2022 History: Diabetes, Received Radiation Weeks Of Treatment: 1 Clustered Wound: No Photos Wound Measurements Length: (cm) 0.1 Width: (cm) 0.1 Depth: (cm) 0.1 Area: (cm) 0.008 Volume: (cm) 0.001 % Reduction in Area: 97.2% % Reduction in Volume: 96.4% Epithelialization: Large (67-100%) Tunneling: No Undermining: No Wound Description Classification: Full Thickness Without Exposed Support Structures Exudate Amount: None Present Foul Odor After Cleansing: No Slough/Fibrino Yes Wound Bed Granulation Amount: None Present (0%) Exposed Structure Necrotic Amount: Large (67-100%) Fascia Exposed: No Necrotic Quality: Adherent Slough Fat Layer (Subcutaneous Tissue) Exposed: No Tendon Exposed: No Muscle Exposed: No Joint Exposed: No Bone Exposed: No Treatment Notes Wound #9 (Lower Leg) Wound Laterality: Left, Proximal Cleanser Byram Ancillary Kit - 15 Day Supply Discharge Instruction: Use supplies as instructed; Kit contains: (15) Saline Bullets; (15) 3x3 Gauze; 15 pr Gloves Soap and Water Discharge Instruction: Gently cleanse wound with antibacterial soap, rinse and pat dry prior to dressing wounds Wound Cleanser Chaires, Kathi Der ([id number]) Discharge Instruction: Wash your hands with soap and water. Remove old dressing, discard into plastic bag and place into trash. Cleanse the wound with Wound Cleanser prior to applying a clean dressing using gauze sponges, not tissues or cotton balls. Do not scrub or use excessive force. Pat dry using gauze sponges, not tissue or cotton balls. Peri-Wound Care Topical Primary Dressing Silvercel 4 1/4x 4 1/4 (in/in) Discharge Instruction: Apply Silvercel 4 1/4x 4 1/4 (in/in) as instructed Secondary Dressing Gauze Discharge Instruction: As directed: dry, Secured With Tubigrip Size D, 3x10  (in/yd) Compression Wrap Compression Stockings Add-Ons Electronic Signature(s) Signed: 06/30/2022 10:43:45 AM By: Carlene Coria RN Entered By: Carlene Coria on 06/29/2022 13:29:47 [name] ([id number]) -------------------------------------------------------------------------------- Vitals Details [name] Date of Service: 06/29/2022 1:15 PM Medical Record Number: [id number] Patient Account Number: [id number] Date of Birth/Sex: [birth year] (88 y.o. F) Treating RN: Carlene Coria Primary Care Raeven Pint: Fulton Reek Other Clinician: Referring Ingra Rother: Fulton Reek  Treating Camron Essman/Extender: Jeri Cos Weeks in Treatment: 5 Vital Signs Time Taken: 13:20 Temperature (F): 97.8 Height (in): 64 Pulse (bpm): 79 Weight (lbs): 166 Respiratory Rate (breaths/min): 18 Body Mass Index (BMI): 28.5 Blood Pressure (mmHg): 150/75 Reference Range: 80 - 120 mg / dl Electronic Signature(s) Signed: 06/30/2022 10:43:45 AM By: Carlene Coria RN Entered By: Carlene Coria on 06/29/2022 13:21:30

## 2022-07-03 ENCOUNTER — Other Ambulatory Visit (INDEPENDENT_AMBULATORY_CARE_PROVIDER_SITE_OTHER): Payer: Self-pay | Admitting: Vascular Surgery

## 2022-07-03 DIAGNOSIS — T829XXS Unspecified complication of cardiac and vascular prosthetic device, implant and graft, sequela: Secondary | ICD-10-CM

## 2022-07-03 DIAGNOSIS — N186 End stage renal disease: Secondary | ICD-10-CM

## 2022-07-04 ENCOUNTER — Ambulatory Visit (INDEPENDENT_AMBULATORY_CARE_PROVIDER_SITE_OTHER): Payer: Medicare Other

## 2022-07-04 DIAGNOSIS — Z992 Dependence on renal dialysis: Secondary | ICD-10-CM | POA: Diagnosis not present

## 2022-07-04 DIAGNOSIS — N186 End stage renal disease: Secondary | ICD-10-CM | POA: Diagnosis not present

## 2022-07-04 DIAGNOSIS — T829XXS Unspecified complication of cardiac and vascular prosthetic device, implant and graft, sequela: Secondary | ICD-10-CM | POA: Diagnosis not present

## 2022-07-06 ENCOUNTER — Encounter: Payer: Medicare Other | Attending: Physician Assistant | Admitting: Physician Assistant

## 2022-07-06 DIAGNOSIS — I2510 Atherosclerotic heart disease of native coronary artery without angina pectoris: Secondary | ICD-10-CM | POA: Insufficient documentation

## 2022-07-06 DIAGNOSIS — S9031XA Contusion of right foot, initial encounter: Secondary | ICD-10-CM | POA: Diagnosis not present

## 2022-07-06 DIAGNOSIS — S81812A Laceration without foreign body, left lower leg, initial encounter: Secondary | ICD-10-CM | POA: Diagnosis not present

## 2022-07-06 DIAGNOSIS — I87333 Chronic venous hypertension (idiopathic) with ulcer and inflammation of bilateral lower extremity: Secondary | ICD-10-CM | POA: Diagnosis not present

## 2022-07-06 DIAGNOSIS — E11622 Type 2 diabetes mellitus with other skin ulcer: Secondary | ICD-10-CM | POA: Diagnosis present

## 2022-07-06 DIAGNOSIS — E1151 Type 2 diabetes mellitus with diabetic peripheral angiopathy without gangrene: Secondary | ICD-10-CM | POA: Diagnosis not present

## 2022-07-06 DIAGNOSIS — I5040 Unspecified combined systolic (congestive) and diastolic (congestive) heart failure: Secondary | ICD-10-CM | POA: Diagnosis not present

## 2022-07-06 DIAGNOSIS — E1122 Type 2 diabetes mellitus with diabetic chronic kidney disease: Secondary | ICD-10-CM | POA: Insufficient documentation

## 2022-07-06 DIAGNOSIS — J449 Chronic obstructive pulmonary disease, unspecified: Secondary | ICD-10-CM | POA: Diagnosis not present

## 2022-07-06 DIAGNOSIS — Z7902 Long term (current) use of antithrombotics/antiplatelets: Secondary | ICD-10-CM | POA: Diagnosis not present

## 2022-07-06 DIAGNOSIS — N186 End stage renal disease: Secondary | ICD-10-CM | POA: Insufficient documentation

## 2022-07-06 DIAGNOSIS — I132 Hypertensive heart and chronic kidney disease with heart failure and with stage 5 chronic kidney disease, or end stage renal disease: Secondary | ICD-10-CM | POA: Diagnosis not present

## 2022-07-06 DIAGNOSIS — W19XXXA Unspecified fall, initial encounter: Secondary | ICD-10-CM | POA: Insufficient documentation

## 2022-07-06 DIAGNOSIS — L97812 Non-pressure chronic ulcer of other part of right lower leg with fat layer exposed: Secondary | ICD-10-CM | POA: Diagnosis not present

## 2022-07-06 DIAGNOSIS — L97822 Non-pressure chronic ulcer of other part of left lower leg with fat layer exposed: Secondary | ICD-10-CM | POA: Diagnosis not present

## 2022-07-06 DIAGNOSIS — I6529 Occlusion and stenosis of unspecified carotid artery: Secondary | ICD-10-CM | POA: Diagnosis not present

## 2022-07-06 NOTE — Progress Notes (Addendum)
DIAMONDS, LIPPARD T ([id number]) [id number] Page 1 of 14 Visit Report for 07/06/2022 Arrival Information Details Patient Name: Date of Service: [name], [name]. 07/06/2022 10:45 A M Medical Record Number: [id number] Patient Account Number: [id number] Date of Birth/Sex: Treating RN: 1934/03/25 (88 y.o. [name] Primary Care Xianna Siverling: Fulton Reek Other Clinician: Referring Yani Coventry: Treating Avleen Bordwell/Extender: Rupert Stacks in Treatment: 6 Visit Information History Since Last Visit Added or deleted any medications: No Patient Arrived: Wheel Chair Pain Present Now: No Arrival Time: 11:26 Accompanied By: husband Transfer Assistance: Manual Patient Identification Verified: Yes Secondary Verification Process Completed: Yes Patient Requires Transmission-Based Precautions: No Patient Has Alerts: Yes Patient Alerts: Patient on Blood Thinner DIABETIC ABI LEFT .79 02/14/22 ABI RIGHT 1.02 02/14/22 Electronic Signature(s) Signed: 07/07/2022 3:11:48 PM By: Gretta Cool, BSN, RN, CWS, Kim RN, BSN Entered By: Gretta Cool, BSN, RN, CWS, Kim on 07/06/2022 11:28:59 -------------------------------------------------------------------------------- Clinic Level of Care Assessment Details Patient Name: Date of Service: [name], [name] 07/06/2022 10:45 A M Medical Record Number: [id number] Patient Account Number: [id number] Date of Birth/Sex: Treating RN: [birth year] (88 y.o. [name] Primary Care Cierrah Dace: Fulton Reek Other Clinician: Referring Hendrix Console: Treating Harol Shabazz/Extender: Rupert Stacks in Treatment: 6 Clinic Level of Care Assessment Items TOOL 4 Quantity Score []  - 0 Use when only an EandM is performed on FOLLOW-UP visit ASSESSMENTS - Nursing Assessment / Reassessment X- 1 10 Reassessment of Co-morbidities (includes updates in patient status) X- 1 5 Reassessment of Adherence to Treatment Plan ASSESSMENTS - Wound  and Skin Assessment / Reassessment MAQUITA, SANDOVAL ([id number]) [id number] Page 2 of 14 []  - 0 Simple Wound Assessment / Reassessment - one wound X- 4 5 Complex Wound Assessment / Reassessment - multiple wounds []  - 0 Dermatologic / Skin Assessment (not related to wound area) ASSESSMENTS - Focused Assessment []  - 0 Circumferential Edema Measurements - multi extremities []  - 0 Nutritional Assessment / Counseling / Intervention []  - 0 Lower Extremity Assessment (monofilament, tuning fork, pulses) []  - 0 Peripheral Arterial Disease Assessment (using hand held doppler) ASSESSMENTS - Ostomy and/or Continence Assessment and Care []  - 0 Incontinence Assessment and Management []  - 0 Ostomy Care Assessment and Management (repouching, etc.) PROCESS - Coordination of Care X - Simple Patient / Family Education for ongoing care 1 15 []  - 0 Complex (extensive) Patient / Family Education for ongoing care X- 1 10 Staff obtains Programmer, systems, Records, T Results / Process Orders est []  - 0 Staff telephones HHA, Nursing Homes / Clarify orders / etc []  - 0 Routine Transfer to another Facility (non-emergent condition) []  - 0 Routine Hospital Admission (non-emergent condition) []  - 0 New Admissions / Biomedical engineer / Ordering NPWT Apligraf, etc. , []  - 0 Emergency Hospital Admission (emergent condition) X- 1 10 Simple Discharge Coordination []  - 0 Complex (extensive) Discharge Coordination PROCESS - Special Needs []  - 0 Pediatric / Minor Patient Management []  - 0 Isolation Patient Management []  - 0 Hearing / Language / Visual special needs []  - 0 Assessment of Community assistance (transportation, D/C planning, etc.) []  - 0 Additional assistance / Altered mentation []  - 0 Support Surface(s) Assessment (bed, cushion, seat, etc.) INTERVENTIONS - Wound Cleansing / Measurement []  - 0 Simple Wound Cleansing - one wound X- 5 5 Complex Wound Cleansing  - multiple wounds X- 1 5 Wound Imaging (photographs - any number of wounds) []  - 0 Wound Tracing (instead of photographs) []  - 0 Simple Wound Measurement - one wound X- 5  5 Complex Wound Measurement - multiple wounds INTERVENTIONS - Wound Dressings []  - 0 Small Wound Dressing one or multiple wounds []  - 0 Medium Wound Dressing one or multiple wounds X- 2 20 Large Wound Dressing one or multiple wounds []  - 0 Application of Medications - topical []  - 0 Application of Medications - injection INTERVENTIONS - Miscellaneous []  - 0 External ear exam []  - 0 Specimen Collection (cultures, biopsies, blood, body fluids, etc.) MURRIEL, HOLWERDA T ([id number]) [id number] Page 3 of 14 []  - 0 Specimen(s) / Culture(s) sent or taken to Lab for analysis []  - 0 Patient Transfer (multiple staff / Harrel Lemon Lift / Similar devices) []  - 0 Simple Staple / Suture removal (25 or less) []  - 0 Complex Staple / Suture removal (26 or more) []  - 0 Hypo / Hyperglycemic Management (close monitor of Blood Glucose) []  - 0 Ankle / Brachial Index (ABI) - do not check if billed separately X- 1 5 Vital Signs Has the patient been seen at the hospital within the last three years: Yes Total Score: 170 Level Of Care: New/Established - Level 5 Electronic Signature(s) Signed: 07/07/2022 3:11:48 PM By: Gretta Cool, BSN, RN, CWS, Kim RN, BSN Entered By: Gretta Cool, BSN, RN, CWS, Kim on 07/06/2022 12:11:18 -------------------------------------------------------------------------------- Encounter Discharge Information Details Patient Name: Date of Service: [name], [name] 07/06/2022 10:45 A M Medical Record Number: [id number] Patient Account Number: [id number] Date of Birth/Sex: Treating RN: 02-23-1934 (88 y.o. [name] Primary Care Lekesha Claw: Fulton Reek Other Clinician: Referring Rachana Malesky: Treating Doryan Bahl/Extender: Rupert Stacks in Treatment: 6 Encounter Discharge  Information Items Discharge Condition: Stable Ambulatory Status: Wheelchair Discharge Destination: Home Transportation: Private Auto Accompanied By: husband Schedule Follow-up Appointment: No Clinical Summary of Care: Patient Declined Electronic Signature(s) Signed: 07/07/2022 3:11:48 PM By: Gretta Cool, BSN, RN, CWS, Kim RN, BSN Entered By: Gretta Cool, BSN, RN, CWS, Kim on 07/06/2022 12:13:06 -------------------------------------------------------------------------------- Lower Extremity Assessment Details Patient Name: Date of Service: [place], [name] 07/06/2022 10:45 A M Medical Record Number: [id number] Patient Account Number: [id number] ROSEMARIE, GALVIS ([id number]) [id number] Page 4 of 14 Date of Birth/Sex: Treating RN: 03/24/1934 (88 y.o. [name] Primary Care Shi Blankenship: Other Clinician: Fulton Reek Referring Kiyanna Biegler: Treating Ieshia Hatcher/Extender: Rupert Stacks in Treatment: 6 Edema Assessment Assessed: [Left: No] [Right: No] [Left: Edema] [Right: :] Calf Left: Right: Point of Measurement: 32 cm From Medial Instep 38 cm 38.5 cm Ankle Left: Right: Point of Measurement: 10 cm From Medial Instep 29 cm 29.5 cm Vascular Assessment Pulses: Dorsalis Pedis Palpable: [Left:No Yes] [Right:No Yes] Notes 3+ pitting edema bilaterally, >3 cap refill bilateral Electronic Signature(s) Signed: 07/07/2022 3:11:48 PM By: Gretta Cool, BSN, RN, CWS, Kim RN, BSN Entered By: Gretta Cool, BSN, RN, CWS, Kim on 07/06/2022 11:50:39 -------------------------------------------------------------------------------- Multi Wound Chart Details Patient Name: Date of Service: Liliane Shi T. 07/06/2022 10:45 A M Medical Record Number: [id number] Patient Account Number: [id number] Date of Birth/Sex: Treating RN: Jan 25, 1934 (88 y.o. [name] Primary Care Tamala Manzer: Fulton Reek Other Clinician: Referring Kobee Medlen: Treating Sherrita Riederer/Extender: Maurilio Lovely Weeks in Treatment: 6 Vital Signs Height(in): 64 Pulse(bpm): 76 Weight(lbs): 166 Blood Pressure(mmHg): 149/73 Body Mass Index(BMI): 28.5 Temperature(F): 97.8 Respiratory Rate(breaths/min): 18 [10:Photos:] Schollmeyer, Anilah T ([id number]) [10:Right, Lateral Lower Leg Wound Location: Gradually Appeared Wounding Event: Venous Leg Ulcer Primary Etiology: Congestive Heart Failure, Comorbid History: Hypertension, Type II Diabetes, Received Radiation 07/05/2022 Date Acquired: 0 Weeks of  Treatment: Open Wound Status: No Wound Recurrence: 0.6x1.8x0.1 Measurements L x W  x D (cm) 0.848 A (cm) : rea 0.085 Volume (cm) : N/A % Reduction in Area: N/A % Reduction in Volume: Full Thickness Without Exposed Classification: Support Structures  Medium Exudate Amount: Serous Exudate Type: amber Exudate Color: None Present (0%) Granulation Amount: Large (67-100%) Necrotic Amount: Fat Layer (Subcutaneous Tissue): Yes N/A Exposed Structures: Fascia: No Tendon: No Muscle: No Joint: No Bone: No None  Epithelialization:] [7:Right, Medial Lower Leg Trauma Diabetic Wound/Ulcer of the Lower Extremity N/A 04/19/2022 6 Open No 1x3.5x0.1 2.749 0.275 71.80% 85.90% Grade 2 Medium Serosanguineous red, brown N/A N/A N/A] [8:[id number]  Page 5 of 14 Left, Distal, Lateral Lower Leg Trauma Diabetic Wound/Ulcer of the Lower Extremity N/A 04/24/2022 6 Open No 0.3x1x0.1 0.236 0.024 90.00% 89.80% Grade 2 Medium Serosanguineous red, brown N/A N/A N/A N/A] Wound Number: 9 N/A N/A Photos: N/A N/A Left, Proximal Lower Leg N/A N/A Wound Location: Pressure Injury N/A N/A Wounding Event: Venous Leg Ulcer N/A N/A Primary Etiology: N/A N/A N/A Comorbid History: 06/19/2022 N/A N/A Date Acquired: 2 N/A N/A Weeks of Treatment: Healed - Epithelialized N/A N/A Wound Status: No N/A N/A Wound Recurrence: 0x0x0 N/A N/A Measurements L x W x D (cm) 0 N/A N/A A (cm) : rea 0 N/A N/A Volume  (cm) : 100.00% N/A N/A % Reduction in A rea: 100.00% N/A N/A % Reduction in Volume: Full Thickness Without Exposed N/A N/A Classification: Support Structures None Present N/A N/A Exudate A mount: N/A N/A N/A Exudate Type: N/A N/A N/A Exudate Color: N/A N/A N/A Granulation A mount: N/A N/A N/A Necrotic A mount: N/A N/A N/A Exposed Structures: N/A N/A N/A Epithelialization: Treatment Notes Wound #10 (Lower Leg) Wound Laterality: Right, Lateral Cleanser Byram Ancillary Kit - 15 Day Supply Discharge Instruction: Use supplies as instructed; Kit contains: (15) Saline Bullets; (15) 3x3 Gauze; 15 pr Gloves Soap and Water Discharge Instruction: Gently cleanse wound with antibacterial soap, rinse and pat dry prior to dressing wounds Wound Cleanser Discharge Instruction: Wash your hands with soap and water. Remove old dressing, discard into plastic bag and place into trash. Cleanse the wound with Wound Cleanser prior to applying a clean dressing using gauze sponges, not tissues or cotton balls. Do not scrub or use excessive force. Pat dry using gauze sponges, not tissue or cotton balls. Peri-Wound Care Topical LATERRICA, LIBMAN T ([id number]) [id number] Page 6 of 14 Primary Dressing Silvercel 4 1/4x 4 1/4 (in/in) Discharge Instruction: Apply Silvercel 4 1/4x 4 1/4 (in/in) as instructed Secondary Dressing Gauze Discharge Instruction: As directed: dry, Secured With Tubigrip Size D, 3x10 (in/yd) Compression Wrap Compression Stockings Add-Ons Wound #7 (Lower Leg) Wound Laterality: Right, Medial Cleanser Byram Ancillary Kit - 15 Day Supply Discharge Instruction: Use supplies as instructed; Kit contains: (15) Saline Bullets; (15) 3x3 Gauze; 15 pr Gloves Soap and Water Discharge Instruction: Gently cleanse wound with antibacterial soap, rinse and pat dry prior to dressing wounds Wound Cleanser Discharge Instruction: Wash your hands with soap and water.  Remove old dressing, discard into plastic bag and place into trash. Cleanse the wound with Wound Cleanser prior to applying a clean dressing using gauze sponges, not tissues or cotton balls. Do not scrub or use excessive force. Pat dry using gauze sponges, not tissue or cotton balls. Peri-Wound Care Topical Primary Dressing Silvercel 4 1/4x 4 1/4 (in/in) Discharge Instruction: Apply Silvercel 4 1/4x 4 1/4 (in/in) as instructed Secondary Dressing Gauze Discharge Instruction: As directed: dry, Secured With Tubigrip Size D, 3x10 (in/yd) Compression Wrap Compression Stockings Add-Ons Wound #8 (  Lower Leg) Wound Laterality: Left, Lateral, Distal Cleanser Byram Ancillary Kit - 15 Day Supply Discharge Instruction: Use supplies as instructed; Kit contains: (15) Saline Bullets; (15) 3x3 Gauze; 15 pr Gloves Soap and Water Discharge Instruction: Gently cleanse wound with antibacterial soap, rinse and pat dry prior to dressing wounds Wound Cleanser Discharge Instruction: Wash your hands with soap and water. Remove old dressing, discard into plastic bag and place into trash. Cleanse the wound with Wound Cleanser prior to applying a clean dressing using gauze sponges, not tissues or cotton balls. Do not scrub or use excessive force. Pat dry using gauze sponges, not tissue or cotton balls. Peri-Wound Care Topical Primary Dressing Silvercel 4 1/4x 4 1/4 (in/in) Discharge Instruction: Apply Silvercel 4 1/4x 4 1/4 (in/in) as instructed Secondary Dressing Gauze Discharge Instruction: As directed: dry, Secured With Tubigrip Size D, 3x10 (in/yd) Longton, Nakyia T ([id number]) [id number] Page 7 of 14 Compression Wrap Compression Stockings Add-Ons Wound #9 (Lower Leg) Wound Laterality: Left, Proximal Cleanser Peri-Wound Care Topical Primary Dressing Secondary Dressing Secured With Compression Wrap Compression Stockings Add-Ons Electronic Signature(s) Signed:  08/07/2022 3:40:01 PM By: Gretta Cool, BSN, RN, CWS, Kim RN, BSN Previous Signature: 07/07/2022 3:11:48 PM Version By: Gretta Cool, BSN, RN, CWS, Kim RN, BSN Entered By: Gretta Cool, BSN, RN, CWS, Kim on 08/07/2022 15:40:01 -------------------------------------------------------------------------------- Multi-Disciplinary Care Plan Details Patient Name: Date of Service: [place], [name] 07/06/2022 10:45 A M Medical Record Number: [id number] Patient Account Number: [id number] Date of Birth/Sex: Treating RN: 09-Jun-1934 (88 y.o. [name] Primary Care Waller Marcussen: Fulton Reek Other Clinician: Referring Katurah Karapetian: Treating Shakeya Kerkman/Extender: Rupert Stacks in Treatment: 6 Active Inactive Necrotic Tissue Nursing Diagnoses: Impaired tissue integrity related to necrotic/devitalized tissue Knowledge deficit related to management of necrotic/devitalized tissue Goals: Necrotic/devitalized tissue will be minimized in the wound bed Date Initiated: 07/06/2022 Target Resolution Date: 07/06/2022 Goal Status: Active Patient/caregiver will verbalize understanding of reason and process for debridement of necrotic tissue Date Initiated: 07/06/2022 Target Resolution Date: 07/06/2022 Goal Status: Active Interventions: Assess patient pain level pre-, during and post procedure and prior to discharge Provide education on necrotic tissue and debridement process Treatment Activities: Excisional debridement : 07/06/2022 [name] ([id number]) [id number] Page 8 of 14 Notes: Wound/Skin Impairment Nursing Diagnoses: Knowledge deficit related to ulceration/compromised skin integrity Goals: Patient/caregiver will verbalize understanding of skin care regimen Date Initiated: 05/23/2022 Target Resolution Date: 07/23/2022 Goal Status: Active Ulcer/skin breakdown will have a volume reduction of 30% by week 4 Date Initiated: 05/23/2022 Date Inactivated: 06/29/2022 Target  Resolution Date: 06/23/2022 Goal Status: Unmet Unmet Reason: comorbdities Ulcer/skin breakdown will have a volume reduction of 50% by week 8 Date Initiated: 05/23/2022 Target Resolution Date: 07/23/2022 Goal Status: Active Ulcer/skin breakdown will have a volume reduction of 80% by week 12 Date Initiated: 05/23/2022 Target Resolution Date: 08/23/2022 Goal Status: Active Ulcer/skin breakdown will heal within 14 weeks Date Initiated: 05/23/2022 Target Resolution Date: 09/22/2022 Goal Status: Active Interventions: Assess patient/caregiver ability to obtain necessary supplies Assess patient/caregiver ability to perform ulcer/skin care regimen upon admission and as needed Assess ulceration(s) every visit Notes: Electronic Signature(s) Signed: 07/07/2022 3:11:48 PM By: Gretta Cool, BSN, RN, CWS, Kim RN, BSN Entered By: Gretta Cool, BSN, RN, CWS, Kim on 07/06/2022 11:51:23 -------------------------------------------------------------------------------- Pain Assessment Details Patient Name: Date of Service: Liliane Shi T. 07/06/2022 10:45 A M Medical Record Number: [id number] Patient Account Number: [id number] Date of Birth/Sex: Treating RN: 04-07-1934 (88 y.o. [name] Primary Care Layni Kreamer: Fulton Reek Other Clinician: Referring Giannah Zavadil: Treating Kailin Principato/Extender: Jeri Cos  Fulton Reek Weeks in Treatment: 6 Active Problems Location of Pain Severity and Description of Pain Patient Has Paino No Site Locations [place], [place] T ([id number]) [id number] Page 9 of 14 Pain Management and Medication Current Pain Management: Notes Patient denies pain at this time. Electronic Signature(s) Signed: 07/07/2022 3:11:48 PM By: Gretta Cool, BSN, RN, CWS, Kim RN, BSN Entered By: Gretta Cool, BSN, RN, CWS, Kim on 07/06/2022 11:31:13 -------------------------------------------------------------------------------- Patient/Caregiver Education Details Patient Name: Date of  Service: [place], [place] 10/5/2023andnbsp10:45 A M Medical Record Number: [id number] Patient Account Number: [id number] Date of Birth/Gender: Treating RN: 1933-12-02 (88 y.o. [name] Primary Care Physician: Fulton Reek Other Clinician: Referring Physician: Treating Physician/Extender: Rupert Stacks in Treatment: 6 Education Assessment Education Provided To: Patient Education Topics Provided Wound/Skin Impairment: Handouts: Caring for Your Ulcer Methods: Demonstration, Explain/Verbal Responses: State content correctly Electronic Signature(s) Signed: 07/07/2022 3:11:48 PM By: Gretta Cool, BSN, RN, CWS, Kim RN, BSN Entered By: Gretta Cool, BSN, RN, CWS, Kim on 07/06/2022 12:12:14 [name] ([id number]) [id number] Page 10 of 14 -------------------------------------------------------------------------------- Wound Assessment Details Patient Name: Date of Service: [place], [place] 07/06/2022 10:45 A M Medical Record Number: [id number] Patient Account Number: [id number] Date of Birth/Sex: Treating RN: 29-Sep-1934 (88 y.o. Charolette Forward, Kim Primary Care Dontaye Hur: Fulton Reek Other Clinician: Referring Kemia Wendel: Treating Tasha Jindra/Extender: Maurilio Lovely Weeks in Treatment: 6 Wound Status Wound Number: 10 Primary Venous Leg Ulcer Etiology: Wound Location: Right, Lateral Lower Leg Wound Open Wounding Event: Gradually Appeared Status: Date Acquired: 07/05/2022 Comorbid Congestive Heart Failure, Hypertension, Type II Diabetes, Weeks Of Treatment: 0 History: Received Radiation Clustered Wound: No Photos Wound Measurements Length: (cm) 0.6 Width: (cm) 1.8 Depth: (cm) 0.1 Area: (cm) 0.848 Volume: (cm) 0.085 % Reduction in Area: % Reduction in Volume: Epithelialization: None Tunneling: No Undermining: No Wound Description Classification: Full Thickness Without Exposed Support Structures Exudate Amount:  Medium Exudate Type: Serous Exudate Color: amber Foul Odor After Cleansing: No Slough/Fibrino Yes Wound Bed Granulation Amount: None Present (0%) Exposed Structure Necrotic Amount: Large (67-100%) Fascia Exposed: No Fat Layer (Subcutaneous Tissue) Exposed: Yes Tendon Exposed: No Muscle Exposed: No Joint Exposed: No Bone Exposed: No Treatment Notes Wound #10 (Lower Leg) Wound Laterality: Right, Lateral Cleanser Byram Ancillary Kit - 15 Day Supply Discharge Instruction: Use supplies as instructed; Kit contains: (15) Saline Bullets; (15) 3x3 Gauze; 15 pr Gloves Soap and [address] Broussard, Angila T ([id number]) [id number] Page 11 of 14 Discharge Instruction: Gently cleanse wound with antibacterial soap, rinse and pat dry prior to dressing wounds Wound Cleanser Discharge Instruction: Wash your hands with soap and water. Remove old dressing, discard into plastic bag and place into trash. Cleanse the wound with Wound Cleanser prior to applying a clean dressing using gauze sponges, not tissues or cotton balls. Do not scrub or use excessive force. Pat dry using gauze sponges, not tissue or cotton balls. Peri-Wound Care Topical Primary Dressing Silvercel 4 1/4x 4 1/4 (in/in) Discharge Instruction: Apply Silvercel 4 1/4x 4 1/4 (in/in) as instructed Secondary Dressing Gauze Discharge Instruction: As directed: dry, Secured With Tubigrip Size D, 3x10 (in/yd) Compression Wrap Compression Stockings Add-Ons Electronic Signature(s) Signed: 08/07/2022 3:39:40 PM By: Gretta Cool, BSN, RN, CWS, Kim RN, BSN Previous Signature: 07/07/2022 3:11:48 PM Version By: Gretta Cool, BSN, RN, CWS, Kim RN, BSN Entered By: Gretta Cool, BSN, RN, CWS, Kim on 08/07/2022 15:39:39 -------------------------------------------------------------------------------- Wound Assessment Details Patient Name: Date of Service: [place], [name] 07/06/2022 10:45 A M Medical Record Number: [id number] Patient Account  Number: [id number] Date  of Birth/Sex: Treating RN: December 10, 1933 (88 y.o. [name] Primary Care Torez Beauregard: Fulton Reek Other Clinician: Referring Aleane Wesenberg: Treating Lollie Gunner/Extender: Maurilio Lovely Weeks in Treatment: 6 Wound Status Wound Number: 7 Primary Etiology: Diabetic Wound/Ulcer of the Lower Extremity Wound Location: Right, Medial Lower Leg Wound Status: Open Wounding Event: Trauma Date Acquired: 04/19/2022 Weeks Of Treatment: 6 Clustered Wound: No Photos Photo Uploaded By: Gretta Cool, BSN, RN, CWS, Kim on 07/06/2022 11:52:29 [name] ([id number]) [id number] Page 12 of 14 Wound Measurements Length: (cm) 1 Width: (cm) 3.5 Depth: (cm) 0.1 Area: (cm) 2.749 Volume: (cm) 0.275 % Reduction in Area: 71.8% % Reduction in Volume: 85.9% Wound Description Classification: Grade 2 Exudate Amount: Medium Exudate Type: Serosanguineous Exudate Color: red, brown Electronic Signature(s) Signed: 07/07/2022 3:11:48 PM By: Gretta Cool, BSN, RN, CWS, Kim RN, BSN Entered By: Gretta Cool, BSN, RN, CWS, Kim on 07/06/2022 11:39:08 -------------------------------------------------------------------------------- Wound Assessment Details Patient Name: Date of Service: Liliane Shi T. 07/06/2022 10:45 A M Medical Record Number: [id number] Patient Account Number: [id number] Date of Birth/Sex: Treating RN: Apr 14, 1934 (88 y.o. [name] Primary Care Thorin Starner: Fulton Reek Other Clinician: Referring Marcellius Montagna: Treating Chrissie Dacquisto/Extender: Maurilio Lovely Weeks in Treatment: 6 Wound Status Wound Number: 8 Primary Etiology: Diabetic Wound/Ulcer of the Lower Extremity Wound Location: Left, Distal, Lateral Lower Leg Wound Status: Open Wounding Event: Trauma Date Acquired: 04/24/2022 Weeks Of Treatment: 6 Clustered Wound: No Photos Photo Uploaded By: Gretta Cool, BSN, RN, CWS, Kim on 07/06/2022 11:52:29 Wound Measurements Length: (cm)  0.3 Width: (cm) 1 Depth: (cm) 0.1 Area: (cm) 0.236 Volume: (cm) 0.024 % Reduction in Area: 90% % Reduction in Volume: 89.8% Wound Description Classification: Grade 2 Exudate Amount: Medium Loveland, Ezma T ([id number]) Exudate Amount: Medium Exudate Type: Serosanguineous Exudate Color: red, brown [id number] Page 13 of 14 Electronic Signature(s) Signed: 07/07/2022 3:11:48 PM By: Gretta Cool, BSN, RN, CWS, Kim RN, BSN Entered By: Gretta Cool, BSN, RN, CWS, Kim on 07/06/2022 11:39:08 -------------------------------------------------------------------------------- Wound Assessment Details Patient Name: Date of Service: [place], [name] 07/06/2022 10:45 A M Medical Record Number: [id number] Patient Account Number: [id number] Date of Birth/Sex: Treating RN: 1933/12/18 (88 y.o. [name] Primary Care Castor Gittleman: Fulton Reek Other Clinician: Referring Willetta York: Treating Peterson Mathey/Extender: Maurilio Lovely Weeks in Treatment: 6 Wound Status Wound Number: 9 Primary Etiology: Venous Leg Ulcer Wound Location: Left, Proximal Lower Leg Wound Status: Healed - Epithelialized Wounding Event: Pressure Injury Date Acquired: 06/19/2022 Weeks Of Treatment: 2 Clustered Wound: No Photos Photo Uploaded By: Gretta Cool, BSN, RN, CWS, Kim on 07/06/2022 11:52:48 Wound Measurements Length: (cm) Width: (cm) Depth: (cm) Area: (cm) Volume: (cm) 0 % Reduction in Area: 100% 0 % Reduction in Volume: 100% 0 0 0 Wound Description Classification: Full Thickness Without Exposed Support Exudate Amount: None Present Structures Treatment Notes Wound #9 (Lower Leg) Wound Laterality: Left, Proximal Cleanser Peri-Wound Care Topical YERALDINE, FORNEY T ([id number]) [id number] Page 14 of 14 Primary Dressing Secondary Dressing Secured With Compression Wrap Compression Stockings Add-Ons Electronic Signature(s) Signed: 07/07/2022 3:11:48 PM  By: Gretta Cool, BSN, RN, CWS, Kim RN, BSN Entered By: Gretta Cool, BSN, RN, CWS, Kim on 07/06/2022 11:53:31 -------------------------------------------------------------------------------- Vitals Details Patient Name: Date of Service: Liliane Shi T. 07/06/2022 10:45 A M Medical Record Number: [id number] Patient Account Number: [id number] Date of Birth/Sex: Treating RN: 06-11-1934 (88 y.o. [name] Primary Care Lourdes Kucharski: Fulton Reek Other Clinician: Referring Karcyn Menn: Treating Arelene Moroni/Extender: Maurilio Lovely Weeks in Treatment: 6 Vital Signs Time Taken: 11:29 Temperature (F): 97.8 Height (in): 64 Pulse (  bpm): 76 Weight (lbs): 166 Respiratory Rate (breaths/min): 18 Body Mass Index (BMI): 28.5 Blood Pressure (mmHg): 149/73 Reference Range: 80 - 120 mg / dl Electronic Signature(s) Signed: 07/07/2022 3:11:48 PM By: Gretta Cool, BSN, RN, CWS, Kim RN, BSN Entered By: Gretta Cool, BSN, RN, CWS, Kim on 07/06/2022 11:30:19

## 2022-07-06 NOTE — Progress Notes (Addendum)
[name] ([id number]) Visit Report for 07/06/2022 Chief Complaint Document Details Patient Name: [name], [name] Date of Service: 07/06/2022 10:45 AM Medical Record Number: [id number] Patient Account Number: [id number] Date of Birth/Sex: 1934-07-23 (88 y.o. F) Treating RN: [name] Primary Care Provider: Fulton Weiss Other Clinician: Referring Provider: Fulton Weiss Treating Provider/Extender: [name] in Treatment: 6 Information Obtained from: Patient Chief Complaint Bilateral LE Ulcers Electronic Signature(s) Signed: 07/06/2022 10:42:50 AM By: Sarah Keeler PA-C Entered By: [name] on 07/06/2022 10:42:50 [name] ([id number]) -------------------------------------------------------------------------------- HPI Details [name] Date of Service: 07/06/2022 10:45 AM Medical Record Number: [id number] Patient Account Number: [id number] Date of Birth/Sex: 04-Oct-1933 (87 y.o. F) Treating RN: [name] Primary Care Provider: Fulton Weiss Other Clinician: Referring Provider: Fulton Weiss Treating Provider/Extender: [name] in Treatment: 6 History of Present Illness HPI Description: ADMISSION 07/14/2020. This is a medically complex 88 year old woman who is sent over urgently from her primary care doctor Dr. Doy Weiss at [facility] and we worked her in this afternoon. She is apparently developed a blister on the dorsal foot sometime over the last 2 weeks on the right which seems to have developed a hemorrhagic component to this. The history here is somewhat vague but this is happened since she left the hospital on 07/02/2020. She also has a blistered area on the left dorsal foot although this does not have a hemorrhagic component to it and it is not threatened to open where is the right side is definitely leaking fluid and could easily break down. I do not believe she has been doing anything specific to  this. With regards to her medical history I have reviewed the discharge summary from the hospital from 06/22/2020 through 07/02/2020. 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 She was given prednisone but was not felt to be a candidate for immunosuppression secondary to her age and comorbidities. She is ANA positive p-ANCA positive. She had a right IJ catheter placed on 9/24 and is being done on dialysis. She is a diabetic. She takes prednisone 20 mg twice daily. She was on aspirin and Plavix although I think these have been put on hold. Past medical history includes; pauci-immune crescentic glomerulonephritis. History of peripheral edema and type 2 diabetes left carotid artery stenosis with a history of a CVA, coronary artery disease and hypertension. She states that she tolerates dialysis reasonably poorly secondary to "butt cramps". She says that when she gets discomfort they stop her dialysis. This probably has something to do with her fluid overloaded state I cannot see any arterial evaluation in her record in [facility] link. We could not do arterial ABIs on her because of complaints of discomfort 10/20; 1 week follow-up. This is a patient who is a diabetic. She recently developed acute renal failure because of pauci-immune glomerulonephritis. Sometime during her initial hospitalization she developed a wound on her right foot tremendous swelling in both legs. She was sent over here urgently last week by her primary care physician with what looks to be hematoma still contained on the right dorsal foot. We put silver alginate on this and put her in compression to control some of the swelling. The left leg had a similar appearance with severe edema in the left dorsal foot so we put that under compression as well. When she arrived back for nurse follow-up on Friday there was some drainage coming out of the wound  which our nurse cultured. This is come back showing Pseudomonas Enterococcus faecalis and staph aureus although the identification of the staph aureus is not complete. I empirically put her on cefdinir 300 mg after dialysis on Tuesday Thursday and Saturday she is only had one dose. She has not been systemically unwell 10/27 1 week follow-up. With considerable help of her nursing staff I was finally able to speak to her nephrologist who manages her at dialysis. Started her on vancomycin and ceftazidime. This to cover the combination of methicillin sensitive staph aureus Enterococcus faecalis and Pseudomonas. Although the staph aureus was methicillin sensitive and the Enterococcus ampicillin sensitive vancomycin provided the best alternative here that can be given at dialysis and ceftazidime to cover the Pseudomonas. She had a traumatic wound on the top of her foot and a complicated abscess. She has significant tissue destruction from this although the wound is looking a lot better today. We are using silver alginate to the surface. X-ray of the foot did not show any deep obvious infection or osteomyelitis 12/1; since the patient was last here she was hospitalized from 11/3 through 11/10 with coag negative staphylococcal bacteremia. I do not know the the exact cause of this was determined. She was seen by vascular and they replaced her permacath on the right side of her neck. Her repeat blood cultures were negative. She is still on IV vancomycin ando Ceftazidime at dialysis. She generally feels systemically well she does not have a fever she is still receiving dialysis but reports she voids up to 8 times a day She has home health changing the dressing I believe they are using silver alginate under 3 layer compression. In addition to the large wound on her right anterior foot that we initially saw her for she has apparently a right lateral leg wound that she suffered during a fall and 2 skin tears on the  left anterior lower leg 12/8; patient comes in looking a lot better this week. One of the 2 skin tears she has on the left leg is healed the other is smaller. The area on the right lateral leg looks improved and her original wound on the right dorsal foot is a lot smaller and looks a lot better. We used Hydrofera Blue to all wound areas under 3 layer compression.. Apparently home health had some trouble with the compression wraps. 12/15 patient comes in with all of her wounds smaller including her original deep punched out wound on the right dorsal foot as well as the skin tears on the right posterior lateral calf and the left anterior lower leg. We have been using Hydrofera Blue under compression excellent results 10/12/2020 upon evaluation today patient appears to be doing decently well currently in regard to her foot ulcer. She does have a small hole still open with some tunneling that really goes proximally in the foot at around the 12:00 location. There is really not significant undermining other locations. This appears to be a small area of where there was a little fluid trapped. Nonetheless I do feel like this is still doing quite well and in general I think that she is going to benefit from possibly packing into this area with endoform in order to keep the area open while it granulates in. 11/09/2020 upon evaluation today patient appears to be doing really about the same in regard to her wound. There is a little reopening towards the distal portion of the wound bed but again I think this is just more  as a result of the dressing possibly getting stuck even to little bit of the scar tissue nonetheless it also anything looks worse although I am not sure the endoform is really doing the job for her. I think we may want to try something a little different here I think packing with a silver alginate dressing may be optimal as compared to what we tried at this point. Is been almost a month since have  seen her and we really have not seen dramatic improvement. 12/07/2020 upon evaluation today patient appears to be doing well with regard to the dorsal surface of her foot. Overall I feel like this looks to be completely healed today and this is excellent news. There is no evidence of active infection which is great news. No fevers, chills, nausea, vomiting, or diarrhea. LEQUISHA, CAMMACK ([id number]) READMISSION 01/26/2022 This is an 88 year old woman that we had in clinic October 21 through March 22 with bilateral lower extremity wounds on the dorsal feet at that time she had pauci-immune glomerulonephritis but was not yet on dialysis. We managed to heal her out. As I remember she had severe bilateral lower extremity edema secondary to acute on chronic renal failure. She arrives in clinic this time with a wound on her distal left dorsal foot that is been there for about 6 months. Completely eschar covered she has been treating this with Neosporin. She said she is on IV vancomycin and cefepime at dialysis although we have no independent verification of this. This is due to be finished on 01/30/2022. She says it was for this foot but she does not say that she has had x-rays and MRI etc. Past medical history is extensive; she has type 2 diabetes although not currently in any treatment, end-stage renal disease on dialysis Monday Wednesday and Friday, coronary artery disease status post CABG, combined systolic and diastolic heart failure, peripheral vascular disease, cerebrovascular disease. Carotid artery stenosis, COPD, breast cancer ABI in our clinic was 0.79. I cannot find that she has had previous noninvasive arterial studies. She is only able to walk around the house does not describe claudication that I can elicit Upon inspection patient's wounds actually appear to be doing decently well. On the left foot this is mainly dry drainage, bleeding, and Iodoflex which is stuck on the surface of the wound  I was able to get that off mechanically she did not want any sharp debridement whatsoever fortunately is able to get it off mechanically without using that just using saline and gauze. Subsequently I think this is otherwise doing quite well. On the right leg she does have swelling and she had a blister that popped up this feels like it almost completely sealed down but it still weeping and leaking a little bit I think we need to manage that as well to be honest. 02-14-2022 upon evaluation today patient appears to be doing well currently in regard to the wound on her foot. She has been tolerating the dressing changes there is a little bit of film buildup here in the patient last week was very resistant to debridement because her ABIs were somewhat low I was avoiding that as well. However she did see Dr. Lazaro Arms and he apparently told her that her ABIs were good everything appeared to be fine and to be honest I think this is excellent news. I am very pleased to hear this and I do believe doing some sharp debridement would be helpful at this point as well. 02-21-2022 upon  evaluation today patient appears to be doing well with regard to her wounds. Both are showing signs of improvement this is great news. Fortunately I do not see any evidence of active infection locally or systemically at this time which is great news. No fevers, chills, nausea, vomiting, or diarrhea. 03-02-2022 upon evaluation today patient appears to be doing better in regard to her right leg which appears to be healed. In regards to her left leg this on the foot at the top still has a small opening at this time. Fortunately there does not appear to be any signs of infection. With that being said I do think we may want to switch over to collagen based dressing to see if we get this to feeling a little bit better. 03-09-2022 upon evaluation today patient appears to be doing well currently in regard to her wound on the top of her left foot. I am  very pleased with where things stand and I think we are headed in the right direction here. She is still having a lot of trouble with the compression wrap. She is doing extremely well on the right side with the Tubigrip so I think we can probably switch over to using the Tubigrip on the left side as well especially since the collagen is getting so dry I do not believe this is doing her as much good as I would like for her to to be honest. 03-23-2022 upon evaluation today patient appears to be doing well with regard to her foot ulcer. This is pretty much healed at this point. Fortunately I see no signs of infection which is great news. The biggest issue I think is good to be keeping her edema under control. The Tubigrip is not very tight but unfortunately rolls down the cuts and on the top of her leg I think she needs something to try to help keep this under better control more evenly she has done well she tells me in the past with an Ace wrap I think we can probably give that a shot. Readmission: 05-23-2022 upon evaluation today patient presents for reevaluation here in the clinic she was last seen June 23, 2022. At that time we got her compression wraps with juxta lites. Unfortunately she did not wear these appropriately she tells me that they were unable to get them on. I am not really sure why that is and to be peripherally honest I think that this to some degree is just a matter of they let her legs get too swollen and out-of-control and then subsequently ended up with it getting to the point that the juxta lites will be been fit. Either way based on what I am seeing at this time I do believe that the patient has been required compression here in the office to get this under control I think the 3 layer compression wraps probably the right way to go she is not going to tolerate anything too tight and the 4-layer I think would be much too tight for her. Patient's past medical history really has  not changed she does have hypertension, end-stage renal disease, and she is on renal dialysis. Unfortunately they are not able to remove so much fluid as it throws her entire electrolyte imbalance that subsequently causes extreme cramping. 05-30-2022 upon evaluation today patient appears to be doing about the same in regard to her wounds. She needs to have some compression but she took off the 3 layer compression wrap she tells me that it  was about to make a fall several times and she could not continue to deal with that. She is states that there is no way she can afford to break anything which I completely understand. With that being said I do believe that the patient should likely have some type of compression on she told me that "she is not here for me to fix her fluid problems she is here for me to fix her wounds." With that being said I explained to the patient that there is not any way to do 1 without the other because the fluid is exacerbating the wound. She did not have much to say that to be honest. 06-08-2022 upon evaluation today patient appears to be doing well with regard to her legs. I do see signs of improvement she did keep the Tubigrip on and her and her husband have been doing this which is extremely good for her at this point. I do not see any signs of active infection which is great news and overall I think she is doing well in that regard. I do believe that keeping the edema under some control even if its not perfect is better than nothing. She just did not tolerate the compression wraps at all. 06-15-2022 upon evaluation today patient appears to be doing excellent in regard to her wound on each leg. She has been tolerating the dressing changes without complication and overall seems to be doing quite well. I do not see any signs of active infection locally or systemically at this point. 06-22-2022 upon evaluation today patient appears to be doing well currently in regard to her wound.  She has been tolerating the dressing changes without complication. Fortunately there does not appear to be any evidence of active infection at this time which is great news. No fevers, chills, Throne, Sarah T. ([id number]) nausea, vomiting, or diarrhea. 06-29-2022 upon evaluation today patient appears to be doing well currently in regard to her wounds. She is actually showing signs of significant improvement which is great news and overall I am extremely pleased with where we stand currently. There does not appear to be any evidence of active infection at this time. 10-5-2023Upon evaluation today patient actually appears to be doing much better at this time in regard to some of the wounds although the one on the right lateral/distal location has opened unfortunately. Again this is something we will watch him last week I was hoping it would just reabsorb but that was definitely not what happened. Fortunately I do not see any evidence of infection locally or systemically at this time. Electronic Signature(s) Signed: 07/06/2022 2:34:00 PM By: Sarah Keeler PA-C Entered By: [name] on 07/06/2022 14:34:00 [name] ([id number]) -------------------------------------------------------------------------------- Physical Exam Details [name] Date of Service: 07/06/2022 10:45 AM Medical Record Number: [id number] Patient Account Number: [id number] Date of Birth/Sex: [birth year] (87 y.o. F) Treating RN: [name] Primary Care Provider: Fulton Weiss Other Clinician: Referring Provider: Fulton Weiss Treating Provider/Extender: [name] in Treatment: 6 Constitutional Well-nourished and well-hydrated in no acute distress. Respiratory normal breathing without difficulty. Psychiatric this patient is able to make decisions and demonstrates good insight into disease process. Alert and Oriented x 3. pleasant and cooperative. Notes Patient's wounds  are actually showing signs of good improvement which is great news and overall I am actually extremely pleased with where we stand I think she is headed in the right direction. Electronic Signature(s) Signed: 07/06/2022 2:34:34 PM By: Melburn Hake,  Margarita Grizzle PA-C Entered By: [name] on 07/06/2022 14:34:33 [name] ([id number]) -------------------------------------------------------------------------------- Physician Orders Details [name] Date of Service: 07/06/2022 10:45 AM Medical Record Number: [id number] Patient Account Number: [id number] Date of Birth/Sex: 1933/10/31 (87 y.o. F) Treating RN: [name] Primary Care Provider: Fulton Weiss Other Clinician: Referring Provider: Fulton Weiss Treating Provider/Extender: [name] in Treatment: 6 Verbal / Phone Orders: No Diagnosis Coding ICD-10 Coding Code Description [phone] Chronic venous hypertension (idiopathic) with ulcer and inflammation of bilateral lower extremity L97.812 Non-pressure chronic ulcer of other part of right lower leg with fat layer exposed L97.822 Non-pressure chronic ulcer of other part of left lower leg with fat layer exposed I10 Essential (primary) hypertension Z99.2 Dependence on renal dialysis I12.0 Hypertensive chronic kidney disease with stage 5 chronic kidney disease or end stage renal disease Follow-up Appointments o Return Appointment in 3 weeks. Bathing/ Shower/ Hygiene o May shower with wound dressing protected with water repellent cover or cast protector. Anesthetic (Use 'Patient Medications' Section for Anesthetic Order Entry) o Lidocaine applied to wound bed Edema Control - Lymphedema / Segmental Compressive Device / Other o Tubigrip single layer applied. - size D o Elevate, Exercise Daily and Avoid Standing for Long Periods of Time. o Elevate legs to the level of the heart and pump ankles as often as possible o Elevate leg(s) parallel  to the floor when sitting. Wound Treatment Wound #10 - Lower Leg Wound Laterality: Right, Lateral Cleanser: Byram Ancillary Kit - 15 Day Supply (Generic) 3 x Per Week/30 Days Discharge Instructions: Use supplies as instructed; Kit contains: (15) Saline Bullets; (15) 3x3 Gauze; 15 pr Gloves Cleanser: Soap and Water 3 x Per Week/30 Days Discharge Instructions: Gently cleanse wound with antibacterial soap, rinse and pat dry prior to dressing wounds Cleanser: Wound Cleanser 3 x Per Week/30 Days Discharge Instructions: Wash your hands with soap and water. Remove old dressing, discard into plastic bag and place into trash. Cleanse the wound with Wound Cleanser prior to applying a clean dressing using gauze sponges, not tissues or cotton balls. Do not scrub or use excessive force. Pat dry using gauze sponges, not tissue or cotton balls. Primary Dressing: Silvercel 4 1/4x 4 1/4 (in/in) (Generic) 3 x Per Week/30 Days Discharge Instructions: Apply Silvercel 4 1/4x 4 1/4 (in/in) as instructed Secondary Dressing: Gauze (Generic) 3 x Per Week/30 Days Discharge Instructions: As directed: dry, Secured With: Tubigrip Size D, 3x10 (in/yd) 3 x Per Week/30 Days Wound #7 - Lower Leg Wound Laterality: Right, Medial Cleanser: Byram Ancillary Kit - 15 Day Supply (Generic) 3 x Per Week/30 Days Discharge Instructions: Use supplies as instructed; Kit contains: (15) Saline Bullets; (15) 3x3 Gauze; 15 pr Gloves Cleanser: Soap and Water 3 x Per Week/30 Days Discharge Instructions: Gently cleanse wound with antibacterial soap, rinse and pat dry prior to dressing wounds Cleanser: Wound Cleanser 3 x Per Week/30 Days Sarah, Weiss ([id number]) Discharge Instructions: Wash your hands with soap and water. Remove old dressing, discard into plastic bag and place into trash. Cleanse the wound with Wound Cleanser prior to applying a clean dressing using gauze sponges, not tissues or cotton balls. Do not scrub or use  excessive force. Pat dry using gauze sponges, not tissue or cotton balls. Primary Dressing: Silvercel 4 1/4x 4 1/4 (in/in) (Generic) 3 x Per Week/30 Days Discharge Instructions: Apply Silvercel 4 1/4x 4 1/4 (in/in) as instructed Secondary Dressing: Gauze (Generic) 3 x Per Week/30 Days Discharge Instructions: As directed: dry, Secured  With: Tubigrip Size D, 3x10 (in/yd) 3 x Per Week/30 Days Wound #8 - Lower Leg Wound Laterality: Left, Lateral, Distal Cleanser: Byram Ancillary Kit - 15 Day Supply (Generic) 3 x Per Week/30 Days Discharge Instructions: Use supplies as instructed; Kit contains: (15) Saline Bullets; (15) 3x3 Gauze; 15 pr Gloves Cleanser: Soap and Water 3 x Per Week/30 Days Discharge Instructions: Gently cleanse wound with antibacterial soap, rinse and pat dry prior to dressing wounds Cleanser: Wound Cleanser 3 x Per Week/30 Days Discharge Instructions: Wash your hands with soap and water. Remove old dressing, discard into plastic bag and place into trash. Cleanse the wound with Wound Cleanser prior to applying a clean dressing using gauze sponges, not tissues or cotton balls. Do not scrub or use excessive force. Pat dry using gauze sponges, not tissue or cotton balls. Primary Dressing: Silvercel 4 1/4x 4 1/4 (in/in) (Generic) 3 x Per Week/30 Days Discharge Instructions: Apply Silvercel 4 1/4x 4 1/4 (in/in) as instructed Secondary Dressing: Gauze (Generic) 3 x Per Week/30 Days Discharge Instructions: As directed: dry, Secured With: Tubigrip Size D, 3x10 (in/yd) 3 x Per Week/30 Days Electronic Signature(s) Signed: 07/06/2022 2:50:44 PM By: Sarah Keeler PA-C Signed: 07/07/2022 3:11:48 PM By: Gretta Cool, BSN, RN, CWS, Kim RN, BSN Entered By: Gretta Cool, BSN, RN, CWS, Kim on 07/06/2022 12:09:54 [name] ([id number]) -------------------------------------------------------------------------------- Problem List Details [name] Date of Service: 07/06/2022 10:45  AM Medical Record Number: [id number] Patient Account Number: [id number] Date of Birth/Sex: 04-16-1934 (87 y.o. F) Treating RN: [name] Primary Care Provider: Fulton Weiss Other Clinician: Referring Provider: Fulton Weiss Treating Provider/Extender: [name] in Treatment: 6 Active Problems ICD-10 Encounter Code Description Active Date MDM Diagnosis I87.333 Chronic venous hypertension (idiopathic) with ulcer and inflammation of 05/23/2022 No Yes bilateral lower extremity L97.812 Non-pressure chronic ulcer of other part of right lower leg with fat layer 05/23/2022 No Yes exposed L97.822 Non-pressure chronic ulcer of other part of left lower leg with fat layer 05/23/2022 No Yes exposed Sanderson (primary) hypertension 05/23/2022 No Yes Z99.2 Dependence on renal dialysis 05/23/2022 No Yes I12.0 Hypertensive chronic kidney disease with stage 5 chronic kidney disease 05/23/2022 No Yes or end stage renal disease Inactive Problems Resolved Problems Electronic Signature(s) Signed: 07/06/2022 10:42:41 AM By: Sarah Keeler PA-C Entered By: [name] on 07/06/2022 10:42:41 [name] ([id number]) -------------------------------------------------------------------------------- Progress Note Details [name] Date of Service: 07/06/2022 10:45 AM Medical Record Number: [id number] Patient Account Number: [id number] Date of Birth/Sex: 04-09-1934 (87 y.o. F) Treating RN: [name] Primary Care Provider: Fulton Weiss Other Clinician: Referring Provider: Fulton Weiss Treating Provider/Extender: [name] in Treatment: 6 Subjective Chief Complaint Information obtained from Patient Bilateral LE Ulcers History of Present Illness (HPI) ADMISSION 07/14/2020. This is a medically complex 88 year old woman who is sent over urgently from her primary care doctor Dr. Doy Weiss at [facility] and we worked her in this afternoon. She is  apparently developed a blister on the dorsal foot sometime over the last 2 weeks on the right which seems to have developed a hemorrhagic component to this. The history here is somewhat vague but this is happened since she left the hospital on 07/02/2020. She also has a blistered area on the left dorsal foot although this does not have a hemorrhagic component to it and it is not threatened to open where is the right side is definitely leaking fluid and could easily break down. I do not believe she  has been doing anything specific to this. With regards to her medical history I have reviewed the discharge summary from the hospital from 06/22/2020 through 07/02/2020. 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 She was given prednisone but was not felt to be a candidate for immunosuppression secondary to her age and comorbidities. She is ANA positive p-ANCA positive. She had a right IJ catheter placed on 9/24 and is being done on dialysis. She is a diabetic. She takes prednisone 20 mg twice daily. She was on aspirin and Plavix although I think these have been put on hold. Past medical history includes; pauci-immune crescentic glomerulonephritis. History of peripheral edema and type 2 diabetes left carotid artery stenosis with a history of a CVA, coronary artery disease and hypertension. She states that she tolerates dialysis reasonably poorly secondary to "butt cramps". She says that when she gets discomfort they stop her dialysis. This probably has something to do with her fluid overloaded state I cannot see any arterial evaluation in her record in [facility]. We could not do arterial ABIs on her because of complaints of discomfort 10/20; 1 week follow-up. This is a patient who is a diabetic. She recently developed acute renal failure because of pauci-immune glomerulonephritis. Sometime during her initial  hospitalization she developed a wound on her right foot tremendous swelling in both legs. She was sent over here urgently last week by her primary care physician with what looks to be hematoma still contained on the right dorsal foot. We put silver alginate on this and put her in compression to control some of the swelling. The left leg had a similar appearance with severe edema in the left dorsal foot so we put that under compression as well. When she arrived back for nurse follow-up on Friday there was some drainage coming out of the wound which our nurse cultured. This is come back showing Pseudomonas Enterococcus faecalis and staph aureus although the identification of the staph aureus is not complete. I empirically put her on cefdinir 300 mg after dialysis on Tuesday Thursday and Saturday she is only had one dose. She has not been systemically unwell 10/27 1 week follow-up. With considerable help of her nursing staff I was finally able to speak to her nephrologist who manages her at dialysis. Started her on vancomycin and ceftazidime. This to cover the combination of methicillin sensitive staph aureus Enterococcus faecalis and Pseudomonas. Although the staph aureus was methicillin sensitive and the Enterococcus ampicillin sensitive vancomycin provided the best alternative here that can be given at dialysis and ceftazidime to cover the Pseudomonas. She had a traumatic wound on the top of her foot and a complicated abscess. She has significant tissue destruction from this although the wound is looking a lot better today. We are using silver alginate to the surface. X-ray of the foot did not show any deep obvious infection or osteomyelitis 12/1; since the patient was last here she was hospitalized from 11/3 through 11/10 with coag negative staphylococcal bacteremia. I do not know the the exact cause of this was determined. She was seen by vascular and they replaced her permacath on the right side of  her neck. Her repeat blood cultures were negative. She is still on IV vancomycin ando Ceftazidime at dialysis. She generally feels systemically well she does not have a fever she is still receiving dialysis but reports she voids up to 8 times a day She has home health changing  the dressing I believe they are using silver alginate under 3 layer compression. In addition to the large wound on her right anterior foot that we initially saw her for she has apparently a right lateral leg wound that she suffered during a fall and 2 skin tears on the left anterior lower leg 12/8; patient comes in looking a lot better this week. One of the 2 skin tears she has on the left leg is healed the other is smaller. The area on the right lateral leg looks improved and her original wound on the right dorsal foot is a lot smaller and looks a lot better. We used Hydrofera Blue to all wound areas under 3 layer compression.. Apparently home health had some trouble with the compression wraps. 12/15 patient comes in with all of her wounds smaller including her original deep punched out wound on the right dorsal foot as well as the skin tears on the right posterior lateral calf and the left anterior lower leg. We have been using Hydrofera Blue under compression excellent results 10/12/2020 upon evaluation today patient appears to be doing decently well currently in regard to her foot ulcer. She does have a small hole still open with some tunneling that really goes proximally in the foot at around the 12:00 location. There is really not significant undermining other locations. This appears to be a small area of where there was a little fluid trapped. Nonetheless I do feel like this is still doing quite well and in general I think that she is going to benefit from possibly packing into this area with endoform in order to keep the area open while it granulates in. 11/09/2020 upon evaluation today patient appears to be doing really  about the same in regard to her wound. There is a little reopening towards the distal portion of the wound bed but again I think this is just more as a result of the dressing possibly getting stuck even to little bit of the scar tissue nonetheless it also anything looks worse although I am not sure the endoform is really doing the job for her. I think we may want to try something a little different here I think packing with a silver alginate dressing may be optimal as compared to what we tried at this point. Is been [name], Sarah T. ([id number]) almost a month since have seen her and we really have not seen dramatic improvement. 12/07/2020 upon evaluation today patient appears to be doing well with regard to the dorsal surface of her foot. Overall I feel like this looks to be completely healed today and this is excellent news. There is no evidence of active infection which is great news. No fevers, chills, nausea, vomiting, or diarrhea. 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 We managed to heal her out. As I remember she had severe bilateral lower extremity edema secondary to acute on chronic renal failure. She arrives in clinic this time with a wound on her distal left dorsal foot that is been there for about 6 months. Completely eschar covered she has been treating this with Neosporin. She said she is on IV vancomycin and cefepime at dialysis although we have no independent verification of this. This is due to be finished on 01/30/2022. She says it was for this foot but she  does not say that she has had x-rays and MRI etc. Past medical history is extensive; she has type 2 diabetes although not currently in any treatment, end-stage renal disease on dialysis Monday Wednesday and Friday, coronary artery  disease status post CABG, combined systolic and diastolic heart failure, peripheral vascular disease, cerebrovascular disease. Carotid artery stenosis, COPD, breast cancer ABI in our clinic was 0.79. I cannot find that she has had previous noninvasive arterial studies. She is only able to walk around the house does not describe claudication that I can elicit Upon inspection patient's wounds actually appear to be doing decently well. On the left foot this is mainly dry drainage, bleeding, and Iodoflex which is stuck on the surface of the wound I was able to get that off mechanically she did not want any sharp debridement whatsoever fortunately is able to get it off mechanically without using that just using saline and gauze. Subsequently I think this is otherwise doing quite well. On the right leg she does have swelling and she had a blister that popped up this feels like it almost completely sealed down but it still weeping and leaking a little bit I think we need to manage that as well to be honest. 02-14-2022 upon evaluation today patient appears to be doing well currently in regard to the wound on her foot. She has been tolerating the dressing changes there is a little bit of film buildup here in the patient last week was very resistant to debridement because her ABIs were somewhat low I was avoiding that as well. However she did see Dr. Lazaro Arms and he apparently told her that her ABIs were good everything appeared to be fine and to be honest I think this is excellent news. I am very pleased to hear this and I do believe doing some sharp debridement would be helpful at this point as well. 02-21-2022 upon evaluation today patient appears to be doing well with regard to her wounds. Both are showing signs of improvement this is great news. Fortunately I do not see any evidence of active infection locally or systemically at this time which is great news. No fevers, chills, nausea, vomiting, or  diarrhea. 03-02-2022 upon evaluation today patient appears to be doing better in regard to her right leg which appears to be healed. In regards to her left leg this on the foot at the top still has a small opening at this time. Fortunately there does not appear to be any signs of infection. With that being said I do think we may want to switch over to collagen based dressing to see if we get this to feeling a little bit better. 03-09-2022 upon evaluation today patient appears to be doing well currently in regard to her wound on the top of her left foot. I am very pleased with where things stand and I think we are headed in the right direction here. She is still having a lot of trouble with the compression wrap. She is doing extremely well on the right side with the Tubigrip so I think we can probably switch over to using the Tubigrip on the left side as well especially since the collagen is getting so dry I do not believe this is doing her as much good as I would like for her to to be honest. 03-23-2022 upon evaluation today patient appears to be doing well with regard to her foot ulcer. This is pretty much healed at this point. Fortunately I see no signs  of infection which is great news. The biggest issue I think is good to be keeping her edema under control. The Tubigrip is not very tight but unfortunately rolls down the cuts and on the top of her leg I think she needs something to try to help keep this under better control more evenly she has done well she tells me in the past with an Ace wrap I think we can probably give that a shot. Readmission: 05-23-2022 upon evaluation today patient presents for reevaluation here in the clinic she was last seen June 23, 2022. At that time we got her compression wraps with juxta lites. Unfortunately she did not wear these appropriately she tells me that they were unable to get them on. I am not really sure why that is and to be peripherally honest I think that  this to some degree is just a matter of they let her legs get too swollen and out-of-control and then subsequently ended up with it getting to the point that the juxta lites will be been fit. Either way based on what I am seeing at this time I do believe that the patient has been required compression here in the office to get this under control I think the 3 layer compression wraps probably the right way to go she is not going to tolerate anything too tight and the 4-layer I think would be much too tight for her. Patient's past medical history really has not changed she does have hypertension, end-stage renal disease, and she is on renal dialysis. Unfortunately they are not able to remove so much fluid as it throws her entire electrolyte imbalance that subsequently causes extreme cramping. 05-30-2022 upon evaluation today patient appears to be doing about the same in regard to her wounds. She needs to have some compression but she took off the 3 layer compression wrap she tells me that it was about to make a fall several times and she could not continue to deal with that. She is states that there is no way she can afford to break anything which I completely understand. With that being said I do believe that the patient should likely have some type of compression on she told me that "she is not here for me to fix her fluid problems she is here for me to fix her wounds." With that being said I explained to the patient that there is not any way to do 1 without the other because the fluid is exacerbating the wound. She did not have much to say that to be honest. 06-08-2022 upon evaluation today patient appears to be doing well with regard to her legs. I do see signs of improvement she did keep the Tubigrip on and her and her husband have been doing this which is extremely good for her at this point. I do not see any signs of active infection which is great news and overall I think she is doing well in that  regard. I do believe that keeping the edema under some control even if its not perfect is better than nothing. She just did not tolerate the compression wraps at all. 06-15-2022 upon evaluation today patient appears to be doing excellent in regard to her wound on each leg. She has been tolerating the dressing [name], Sarah T. ([id number]) changes without complication and overall seems to be doing quite well. I do not see any signs of active infection locally or systemically at this point. 06-22-2022 upon evaluation today patient appears  to be doing well currently in regard to her wound. She has been tolerating the dressing changes without complication. Fortunately there does not appear to be any evidence of active infection at this time which is great news. No fevers, chills, nausea, vomiting, or diarrhea. 06-29-2022 upon evaluation today patient appears to be doing well currently in regard to her wounds. She is actually showing signs of significant improvement which is great news and overall I am extremely pleased with where we stand currently. There does not appear to be any evidence of active infection at this time. 10-5-2023Upon evaluation today patient actually appears to be doing much better at this time in regard to some of the wounds although the one on the right lateral/distal location has opened unfortunately. Again this is something we will watch him last week I was hoping it would just reabsorb but that was definitely not what happened. Fortunately I do not see any evidence of infection locally or systemically at this time. Objective Constitutional Well-nourished and well-hydrated in no acute distress. Vitals Time Taken: 11:29 AM, Height: 64 in, Weight: 166 lbs, BMI: 28.5, Temperature: 97.8 F, Pulse: 76 bpm, Respiratory Rate: 18 breaths/min, Blood Pressure: 149/73 mmHg. Respiratory normal breathing without difficulty. Psychiatric this patient is able to make decisions and  demonstrates good insight into disease process. Alert and Oriented x 3. pleasant and cooperative. General Notes: Patient's wounds are actually showing signs of good improvement which is great news and overall I am actually extremely pleased with where we stand I think she is headed in the right direction. Integumentary (Hair, Skin) Wound #10 status is Open. Original cause of wound was Gradually Appeared. The date acquired was: 07/05/2022. The wound is located on the Right,Lateral Lower Leg. The wound measures 0.6cm length x 1.8cm width x 0.01cm depth; 0.848cm^2 area and 0.008cm^3 volume. There is Fat Layer (Subcutaneous Tissue) exposed. There is no tunneling or undermining noted. There is a medium amount of serous drainage noted. There is no granulation within the wound bed. There is a large (67-100%) amount of necrotic tissue within the wound bed including Adherent Slough. Wound #7 status is Open. Original cause of wound was Trauma. The date acquired was: 04/19/2022. The wound has been in treatment 6 weeks. The wound is located on the Right,Medial Lower Leg. The wound measures 1cm length x 3.5cm width x 0.1cm depth; 2.749cm^2 area and 0.275cm^3 volume. There is a medium amount of serosanguineous drainage noted. Wound #8 status is Open. Original cause of wound was Trauma. The date acquired was: 04/24/2022. The wound has been in treatment 6 weeks. The wound is located on the Left,Distal,Lateral Lower Leg. The wound measures 0.3cm length x 1cm width x 0.1cm depth; 0.236cm^2 area and 0.024cm^3 volume. There is a medium amount of serosanguineous drainage noted. Wound #9 status is Healed - Epithelialized. Original cause of wound was Pressure Injury. The date acquired was: 06/19/2022. The wound has been in treatment 2 weeks. The wound is located on the Left,Proximal Lower Leg. The wound measures 0cm length x 0cm width x 0cm depth; 0cm^2 area and 0cm^3 volume. There is a none present amount of drainage  noted. Assessment Active Problems ICD-10 Chronic venous hypertension (idiopathic) with ulcer and inflammation of bilateral lower extremity Non-pressure chronic ulcer of other part of right lower leg with fat layer exposed Non-pressure chronic ulcer of other part of left lower leg with fat layer exposed Essential (primary) hypertension Dependence on renal dialysis Hypertensive chronic kidney disease with stage 5 chronic kidney disease  or end stage renal disease [name], [name] ([id number]) Plan Follow-up Appointments: Return Appointment in 3 weeks. Bathing/ Shower/ Hygiene: May shower with wound dressing protected with water repellent cover or cast protector. Anesthetic (Use 'Patient Medications' Section for Anesthetic Order Entry): Lidocaine applied to wound bed Edema Control - Lymphedema / Segmental Compressive Device / Other: Tubigrip single layer applied. - size D Elevate, Exercise Daily and Avoid Standing for Long Periods of Time. Elevate legs to the level of the heart and pump ankles as often as possible Elevate leg(s) parallel to the floor when sitting. WOUND #10: - Lower Leg Wound Laterality: Right, Lateral Cleanser: Byram Ancillary Kit - 15 Day Supply (Generic) 3 x Per Week/30 Days Discharge Instructions: Use supplies as instructed; Kit contains: (15) Saline Bullets; (15) 3x3 Gauze; 15 pr Gloves Cleanser: Soap and Water 3 x Per Week/30 Days Discharge Instructions: Gently cleanse wound with antibacterial soap, rinse and pat dry prior to dressing wounds Cleanser: Wound Cleanser 3 x Per Week/30 Days Discharge Instructions: Wash your hands with soap and water. Remove old dressing, discard into plastic bag and place into trash. Cleanse the wound with Wound Cleanser prior to applying a clean dressing using gauze sponges, not tissues or cotton balls. Do not scrub or use excessive force. Pat dry using gauze sponges, not tissue or cotton balls. Primary Dressing: Silvercel 4 1/4x 4  1/4 (in/in) (Generic) 3 x Per Week/30 Days Discharge Instructions: Apply Silvercel 4 1/4x 4 1/4 (in/in) as instructed Secondary Dressing: Gauze (Generic) 3 x Per Week/30 Days Discharge Instructions: As directed: dry, Secured With: Tubigrip Size D, 3x10 (in/yd) 3 x Per Week/30 Days WOUND #7: - Lower Leg Wound Laterality: Right, Medial Cleanser: Byram Ancillary Kit - 15 Day Supply (Generic) 3 x Per Week/30 Days Discharge Instructions: Use supplies as instructed; Kit contains: (15) Saline Bullets; (15) 3x3 Gauze; 15 pr Gloves Cleanser: Soap and Water 3 x Per Week/30 Days Discharge Instructions: Gently cleanse wound with antibacterial soap, rinse and pat dry prior to dressing wounds Cleanser: Wound Cleanser 3 x Per Week/30 Days Discharge Instructions: Wash your hands with soap and water. Remove old dressing, discard into plastic bag and place into trash. Cleanse the wound with Wound Cleanser prior to applying a clean dressing using gauze sponges, not tissues or cotton balls. Do not scrub or use excessive force. Pat dry using gauze sponges, not tissue or cotton balls. Primary Dressing: Silvercel 4 1/4x 4 1/4 (in/in) (Generic) 3 x Per Week/30 Days Discharge Instructions: Apply Silvercel 4 1/4x 4 1/4 (in/in) as instructed Secondary Dressing: Gauze (Generic) 3 x Per Week/30 Days Discharge Instructions: As directed: dry, Secured With: Tubigrip Size D, 3x10 (in/yd) 3 x Per Week/30 Days WOUND #8: - Lower Leg Wound Laterality: Left, Lateral, Distal Cleanser: Byram Ancillary Kit - 15 Day Supply (Generic) 3 x Per Week/30 Days Discharge Instructions: Use supplies as instructed; Kit contains: (15) Saline Bullets; (15) 3x3 Gauze; 15 pr Gloves Cleanser: Soap and Water 3 x Per Week/30 Days Discharge Instructions: Gently cleanse wound with antibacterial soap, rinse and pat dry prior to dressing wounds Cleanser: Wound Cleanser 3 x Per Week/30 Days Discharge Instructions: Wash your hands with soap and water.  Remove old dressing, discard into plastic bag and place into trash. Cleanse the wound with Wound Cleanser prior to applying a clean dressing using gauze sponges, not tissues or cotton balls. Do not scrub or use excessive force. Pat dry using gauze sponges, not tissue or cotton balls. Primary Dressing: Silvercel 4 1/4x 4 1/4 (  in/in) (Generic) 3 x Per Week/30 Days Discharge Instructions: Apply Silvercel 4 1/4x 4 1/4 (in/in) as instructed Secondary Dressing: Gauze (Generic) 3 x Per Week/30 Days Discharge Instructions: As directed: dry, Secured With: Tubigrip Size D, 3x10 (in/yd) 3 x Per Week/30 Days 1. I am good recommend that we have the patient go ahead and continue to monitor for any signs of worsening or infection. Obviously right now think she is doing well and her husband is doing a great job changing this at home. 2. I am also can recommend that we have the patient continue with the silver alginate dressing to all wound locations. 3. I would also recommend that she continue with the Tubigrip. We will see patient back for reevaluation in 1 week here in the clinic. If anything worsens or changes patient will contact our office for additional recommendations. Electronic Signature(s) Signed: 07/06/2022 2:36:49 PM By: Sarah Keeler PA-C Entered By: [name] on 07/06/2022 14:36:49 [name] ([id number]) -------------------------------------------------------------------------------- SuperBill Details [name] Date of Service: 07/06/2022 Medical Record Number: [id number] Patient Account Number: [id number] Date of Birth/Sex: 1934/03/22 (87 y.o. 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 Facility Procedures CPT4 Code: [id number] Description: 20802 - WOUND CARE VISIT-LEV 5 EST PT Modifier: Quantity: 1 Physician Procedures CPT4 Code Description: [id number] - WC PHYS LEVEL 3 - EST PT Modifier: Quantity: 1 CPT4 Code Description: ICD-10 Diagnosis Description I87.333 Chronic venous hypertension (idiopathic) with ulcer and inflammation of L97.812 Non-pressure chronic ulcer of other part of right lower leg with fat lay L97.822 Non-pressure chronic ulcer of  other part of left lower leg with fat laye I10 Essential (primary) hypertension Modifier: bilateral lower extre er exposed r exposed Quantity: mity Electronic Signature(s) Signed: 07/06/2022 2:37:03 PM By: Sarah Keeler PA-C Entered By: [name] on 07/06/2022 14:37:03

## 2022-07-13 ENCOUNTER — Ambulatory Visit: Payer: Medicare Other | Admitting: Internal Medicine

## 2022-07-27 ENCOUNTER — Encounter: Payer: Medicare Other | Admitting: Physician Assistant

## 2022-07-27 DIAGNOSIS — E11622 Type 2 diabetes mellitus with other skin ulcer: Secondary | ICD-10-CM | POA: Diagnosis not present

## 2022-07-27 NOTE — Progress Notes (Addendum)
Sarah, Weiss Weiss ([id number]) [id number] Page 1 of 10 Visit Report for 07/27/2022 Chief Complaint Document Details Patient Name: Date of Service: Sarah, Weiss [name] 07/27/2022 12:45 PM Medical Record Number: [id number] Patient Account Number: [id number] Date of Birth/Sex: Treating RN: 11-17-1933 (88 y.o. [name] Primary Care Provider: Fulton Weiss Other Clinician: Massie Weiss Referring Provider: Treating Provider/Extender: [name] in Treatment: 9 Information Obtained from: Patient Chief Complaint Bilateral LE Ulcers Electronic Signature(s) Signed: 07/27/2022 1:06:19 PM By: Worthy Keeler PA-C Entered By: Worthy Keeler on 07/27/2022 41:93:79 -------------------------------------------------------------------------------- HPI Details Patient Name: Date of Service: [name], [name]. 07/27/2022 12:45 PM Medical Record Number: [id number] Patient Account Number: [id number] Date of Birth/Sex: Treating RN: 01/30/1934 (88 y.o. [name] Primary Care Provider: Fulton Weiss Other Clinician: Massie Weiss Referring Provider: Treating Provider/Extender: [name] in Treatment: 9 History of Present Illness HPI Description: ADMISSION 07/14/2020. This is a medically complex 88 year old woman who is sent over urgently from her primary care doctor Dr. Doy Weiss at [facility] and we worked her in this afternoon. She is apparently developed a blister on the dorsal foot sometime over the last 2 Weiss on the right which seems to have developed a hemorrhagic component to this. The history here is somewhat vague but this is happened since she left the hospital on 07/02/2020. She also has a blistered area on the left dorsal foot although this does not have a hemorrhagic component to it and it is not threatened to open where is the right side is definitely leaking fluid and could easily break down. I  do not believe she has been doing anything specific to this. With regards to her medical history I have reviewed the discharge summary from the hospital from 06/22/2020 through 07/02/2020. 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 She was given prednisone but was not felt to be a candidate for immunosuppression secondary to her age and comorbidities. She is ANA positive p-ANCA positive. She had a right IJ catheter placed on 9/24 and is being done on dialysis. She is a diabetic. She takes prednisone 20 mg twice daily. She was on aspirin and Plavix although I think these have been put on hold. Past medical history includes; pauci-immune crescentic glomerulonephritis. History of peripheral edema and type 2 diabetes left carotid artery stenosis with a history of a CVA, coronary artery disease and hypertension. She states that she tolerates dialysis reasonably poorly secondary to "butt cramps". She says that when she gets discomfort they stop her dialysis. This probably has something to do with her fluid overloaded state [name], [name] ([id number]) [id number] Page 2 of 10 I cannot see any arterial evaluation in her record in [name] link. We could not do arterial ABIs on her because of complaints of discomfort 10/20; 1 week follow-up. This is a patient who is a diabetic. She recently developed acute renal failure because of pauci-immune glomerulonephritis. Sometime during her initial hospitalization she developed a wound on her right foot tremendous swelling in both legs. She was sent over here urgently last week by her primary care physician with what looks to be hematoma still contained on the right dorsal foot. We put silver alginate on this and put her in compression to control some of the swelling. The left leg had a similar appearance with severe edema in the left dorsal foot so  we put that under compression as well. When  she arrived back for nurse follow-up on Friday there was some drainage coming out of the wound which our nurse cultured. This is come back showing Pseudomonas Enterococcus faecalis and staph aureus although the identification of the staph aureus is not complete. I empirically put her on cefdinir 300 mg after dialysis on Tuesday Thursday and Saturday she is only had one dose. She has not been systemically unwell 10/27 1 week follow-up. With considerable help of her nursing staff I was finally able to speak to her nephrologist who manages her at dialysis. Started her on vancomycin and ceftazidime. This to cover the combination of methicillin sensitive staph aureus Enterococcus faecalis and Pseudomonas. Although the staph aureus was methicillin sensitive and the Enterococcus ampicillin sensitive vancomycin provided the best alternative here that can be given at dialysis and ceftazidime to cover the Pseudomonas. She had a traumatic wound on the top of her foot and a complicated abscess. She has significant tissue destruction from this although the wound is looking a lot better today. We are using silver alginate to the surface. X-ray of the foot did not show any deep obvious infection or osteomyelitis 12/1; since the patient was last here she was hospitalized from 11/3 through 11/10 with coag negative staphylococcal bacteremia. I do not know the the exact cause of this was determined. She was seen by vascular and they replaced her permacath on the right side of her neck. Her repeat blood cultures were negative. She is still on IV vancomycin ando Ceftazidime at dialysis. She generally feels systemically well she does not have a fever she is still receiving dialysis but reports she voids up to 8 times a day She has home health changing the dressing I believe they are using silver alginate under 3 layer compression. In addition to the large wound on her  right anterior foot that we initially saw her for she has apparently a right lateral leg wound that she suffered during a fall and 2 skin tears on the left anterior lower leg 12/8; patient comes in looking a lot better this week. One of the 2 skin tears she has on the left leg is healed the other is smaller. The area on the right lateral leg looks improved and her original wound on the right dorsal foot is a lot smaller and looks a lot better. We used Hydrofera Blue to all wound areas under 3 layer compression.. Apparently home health had some trouble with the compression wraps. 12/15 patient comes in with all of her wounds smaller including her original deep punched out wound on the right dorsal foot as well as the skin tears on the right posterior lateral calf and the left anterior lower leg. We have been using Hydrofera Blue under compression excellent results 10/12/2020 upon evaluation today patient appears to be doing decently well currently in regard to her foot ulcer. She does have a small hole still open with some tunneling that really goes proximally in the foot at around the 12:00 location. There is really not significant undermining other locations. This appears to be a small area of where there was a little fluid trapped. Nonetheless I do feel like this is still doing quite well and in general I think that she is going to benefit from possibly packing into this area with endoform in order to keep the area open while it granulates in. 11/09/2020 upon evaluation today patient appears to be doing really about the same in regard to her wound. There is a little  reopening towards the distal portion of the wound bed but again I think this is just more as a result of the dressing possibly getting stuck even to little bit of the scar tissue nonetheless it also anything looks worse although I am not sure the endoform is really doing the job for her. I think we may want to try something a little different  here I think packing with a silver alginate dressing may be optimal as compared to what we tried at this point. Is been almost a month since have seen her and we really have not seen dramatic improvement. 12/07/2020 upon evaluation today patient appears to be doing well with regard to the dorsal surface of her foot. Overall I feel like this looks to be completely healed today and this is excellent news. There is no evidence of active infection which is great news. No fevers, chills, nausea, vomiting, or diarrhea. 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 We managed to heal her out. As I remember she had severe bilateral lower extremity edema secondary to acute on chronic renal failure. She arrives in clinic this time with a wound on her distal left dorsal foot that is been there for about 6 months. Completely eschar covered she has been treating this with Neosporin. She said she is on IV vancomycin and cefepime at dialysis although we have no independent verification of this. This is due to be finished on 01/30/2022. She says it was for this foot but she does not say that she has had x-rays and MRI etc. Past medical history is extensive; she has type 2 diabetes although not currently in any treatment, end-stage renal disease on dialysis Monday Wednesday and Friday, coronary artery disease status post CABG, combined systolic and diastolic heart failure, peripheral vascular disease, cerebrovascular disease. Carotid artery stenosis, COPD, breast cancer ABI in our clinic was 0.79. I cannot find that she has had previous noninvasive arterial studies. She is only able to walk around the house does not describe claudication that I can elicit Upon inspection patient's wounds actually appear to be doing decently  well. On the left foot this is mainly dry drainage, bleeding, and Iodoflex which is stuck on the surface of the wound I was able to get that off mechanically she did not want any sharp debridement whatsoever fortunately is able to get it off mechanically without using that just using saline and gauze. Subsequently I think this is otherwise doing quite well. On the right leg she does have swelling and she had a blister that popped up this feels like it almost completely sealed down but it still weeping and leaking a little bit I think we need to manage that as well to be honest. 02-14-2022 upon evaluation today patient appears to be doing well currently in regard to the wound on her foot. She has been tolerating the dressing changes there is a little bit of film buildup here in the patient last week was very resistant to debridement because her ABIs were somewhat low I was avoiding that as well. However she did see Dr. Lazaro Arms and he apparently told her that her ABIs were good everything appeared to be fine and to be honest I think this is excellent news. I am very pleased to hear this and I do believe doing  some sharp debridement would be helpful at this point as well. 02-21-2022 upon evaluation today patient appears to be doing well with regard to her wounds. Both are showing signs of improvement this is great news. Fortunately I do not see any evidence of active infection locally or systemically at this time which is great news. No fevers, chills, nausea, vomiting, or diarrhea. 03-02-2022 upon evaluation today patient appears to be doing better in regard to her right leg which appears to be healed. In regards to her left leg this on the foot at the top still has a small opening at this time. Fortunately there does not appear to be any signs of infection. With that being said I do think we may want to switch over to collagen based dressing to see if we get this to feeling a little bit better. 03-09-2022 upon  evaluation today patient appears to be doing well currently in regard to her wound on the top of her left foot. I am very pleased with where things stand and I think we are headed in the right direction here. She is still having a lot of trouble with the compression wrap. She is doing extremely well on the right side with the Tubigrip so I think we can probably switch over to using the Tubigrip on the left side as well especially since the collagen is getting so dry I do not believe this is doing her as much good as I would like for her to to be honest. 03-23-2022 upon evaluation today patient appears to be doing well with regard to her foot ulcer. This is pretty much healed at this point. Fortunately I see no signs of infection which is great news. The biggest issue I think is good to be keeping her edema under control. The Tubigrip is not very tight but unfortunately rolls down the cuts and on the top of her leg I think she needs something to try to help keep this under better control more evenly she has done well she tells me in the past with an Ace wrap I think we can probably give that a shot. Readmission: [name], [name] ([id number]) [id number] Page 3 of 10 05-23-2022 upon evaluation today patient presents for reevaluation here in the clinic she was last seen June 23, 2022. At that time we got her compression wraps with juxta lites. Unfortunately she did not wear these appropriately she tells me that they were unable to get them on. I am not really sure why that is and to be peripherally honest I think that this to some degree is just a matter of they let her legs get too swollen and out-of-control and then subsequently ended up with it getting to the point that the juxta lites will be been fit. Either way based on what I am seeing at this time I do believe that the patient has been required compression here in the office to get this under control I think the 3  layer compression wraps probably the right way to go she is not going to tolerate anything too tight and the 4-layer I think would be much too tight for her. Patient's past medical history really has not changed she does have hypertension, end-stage renal disease, and she is on renal dialysis. Unfortunately they are not able to remove so much fluid as it throws her entire electrolyte imbalance that subsequently causes extreme cramping. 05-30-2022 upon evaluation today patient appears to be doing about the same in regard to  her wounds. She needs to have some compression but she took off the 3 layer compression wrap she tells me that it was about to make a fall several times and she could not continue to deal with that. She is states that there is no way she can afford to break anything which I completely understand. With that being said I do believe that the patient should likely have some type of compression on she told me that "she is not here for me to fix her fluid problems she is here for me to fix her wounds." With that being said I explained to the patient that there is not any way to do 1 without the other because the fluid is exacerbating the wound. She did not have much to say that to be honest. 06-08-2022 upon evaluation today patient appears to be doing well with regard to her legs. I do see signs of improvement she did keep the Tubigrip on and her and her husband have been doing this which is extremely good for her at this point. I do not see any signs of active infection which is great news and overall I think she is doing well in that regard. I do believe that keeping the edema under some control even if its not perfect is better than nothing. She just did not tolerate the compression wraps at all. 06-15-2022 upon evaluation today patient appears to be doing excellent in regard to her wound on each leg. She has been tolerating the dressing changes without complication and overall seems to be  doing quite well. I do not see any signs of active infection locally or systemically at this point. 06-22-2022 upon evaluation today patient appears to be doing well currently in regard to her wound. She has been tolerating the dressing changes without complication. Fortunately there does not appear to be any evidence of active infection at this time which is great news. No fevers, chills, nausea, vomiting, or diarrhea. 06-29-2022 upon evaluation today patient appears to be doing well currently in regard to her wounds. She is actually showing signs of significant improvement which is great news and overall I am extremely pleased with where we stand currently. There does not appear to be any evidence of active infection at this time. 10-5-2023Upon evaluation today patient actually appears to be doing much better at this time in regard to some of the wounds although the one on the right lateral/distal location has opened unfortunately. Again this is something we will watch him last week I was hoping it would just reabsorb but that was definitely not what happened. Fortunately I do not see any evidence of infection locally or systemically at this time. 07-27-2022 in general patient's wounds in all locations appear to be doing significantly better which is great news and overall I am extremely pleased with where we stand today. I do not see any evidence of active infection locally or systemically at this time. Electronic Signature(s) Signed: 07/27/2022 4:50:39 PM By: Worthy Keeler PA-C Entered By: Worthy Keeler on 07/27/2022 16:50:38 -------------------------------------------------------------------------------- Physical Exam Details Patient Name: Date of Service: [place], [name] 07/27/2022 12:45 PM Medical Record Number: [id number] Patient Account Number: [id number] Date of Birth/Sex: Treating RN: May 27, 1934 (88 y.o. [name] Primary Care Provider: Fulton Weiss Other Clinician:  Massie Weiss Referring Provider: Treating Provider/Extender: [name] in Treatment: 63 Constitutional Well-nourished and well-hydrated in no acute distress. Respiratory normal breathing without difficulty. Psychiatric this patient is able  to make decisions and demonstrates good insight into disease process. Alert and Oriented x 3. pleasant and cooperative. Notes Upon inspection patient's wounds again did not require any sharp debridement she did really does not allow me to debride anyway I did mechanically debride with saline and gauze and postdebridement this appears better but no sharp debridement performed. Overall I do feel like she is making progress but with the swelling in her legs as well as the overall types of wounds that she has here I definitely think she is still needs to try to elevate her legs much as possible and still use the Tubigrip which she seems to be doing excellent with. [name], [name] ([id number]) [id number] Page 4 of 10 Electronic Signature(s) Signed: 07/27/2022 4:51:11 PM By: Worthy Keeler PA-C Entered By: Worthy Keeler on 07/27/2022 16:51:11 -------------------------------------------------------------------------------- Physician Orders Details Patient Name: Date of Service: Sarah Shi Weiss. 07/27/2022 12:45 PM Medical Record Number: [id number] Patient Account Number: [id number] Date of Birth/Sex: Treating RN: [birth year] (88 y.o. Charolette Forward, Kim Primary Care Provider: Fulton Weiss Other Clinician: Massie Weiss Referring Provider: Treating Provider/Extender: [name] in Treatment: 9 Verbal / Phone Orders: No Diagnosis Coding ICD-10 Coding Code Description [phone] Chronic venous hypertension (idiopathic) with ulcer and inflammation of bilateral lower extremity L97.812 Non-pressure chronic ulcer of other part of right lower leg with fat layer exposed L97.822  Non-pressure chronic ulcer of other part of left lower leg with fat layer exposed I10 Essential (primary) hypertension Z99.2 Dependence on renal dialysis I12.0 Hypertensive chronic kidney disease with stage 5 chronic kidney disease or end stage renal disease Follow-up Appointments Return Appointment in 3 Weiss. Bathing/ Shower/ Hygiene May shower with wound dressing protected with water repellent cover or cast protector. Anesthetic (Use 'Patient Medications' Section for Anesthetic Order Entry) Lidocaine applied to wound bed Edema Control - Lymphedema / Segmental Compressive Device / Other Tubigrip single layer applied. - size D Elevate, Exercise Daily and A void Standing for Long Periods of Time. Elevate legs to the level of the heart and pump ankles as often as possible Elevate leg(s) parallel to the floor when sitting. Wound Treatment Wound #10 - Lower Leg Wound Laterality: Right, Lateral Cleanser: Byram Ancillary Kit - 15 Day Supply (Generic) 3 x Per Week/30 Days Discharge Instructions: Use supplies as instructed; Kit contains: (15) Saline Bullets; (15) 3x3 Gauze; 15 pr Gloves Cleanser: Soap and Water 3 x Per Week/30 Days Discharge Instructions: Gently cleanse wound with antibacterial soap, rinse and pat dry prior to dressing wounds Cleanser: Wound Cleanser 3 x Per Week/30 Days Discharge Instructions: Wash your hands with soap and water. Remove old dressing, discard into plastic bag and place into trash. Cleanse the wound with Wound Cleanser prior to applying a clean dressing using gauze sponges, not tissues or cotton balls. Do not scrub or use excessive force. Pat dry using gauze sponges, not tissue or cotton balls. Prim Dressing: Silvercel 4 1/4x 4 1/4 (in/in) (Generic) 3 x Per Week/30 Days ary Discharge Instructions: Apply Silvercel 4 1/4x 4 1/4 (in/in) as instructed Secondary Dressing: Gauze (Generic) 3 x Per Week/30 Days Discharge Instructions: As directed: dry, Secured With:  Tubigrip Size D, 3x10 (in/yd) 3 x Per Week/30 Days [name], [name] ([id number]) [id number] Page 5 of 10 Wound #7 - Lower Leg Wound Laterality: Right, Medial Cleanser: Byram Ancillary Kit - 15 Day Supply (Generic) 3 x Per Week/30 Days Discharge Instructions: Use supplies as instructed; Kit contains: (15) Saline Bullets; (15) 3x3 Gauze; 15  pr Gloves Cleanser: Soap and Water 3 x Per Week/30 Days Discharge Instructions: Gently cleanse wound with antibacterial soap, rinse and pat dry prior to dressing wounds Cleanser: Wound Cleanser 3 x Per Week/30 Days Discharge Instructions: Wash your hands with soap and water. Remove old dressing, discard into plastic bag and place into trash. Cleanse the wound with Wound Cleanser prior to applying a clean dressing using gauze sponges, not tissues or cotton balls. Do not scrub or use excessive force. Pat dry using gauze sponges, not tissue or cotton balls. Prim Dressing: Silvercel 4 1/4x 4 1/4 (in/in) (Generic) 3 x Per Week/30 Days ary Discharge Instructions: Apply Silvercel 4 1/4x 4 1/4 (in/in) as instructed Secondary Dressing: Gauze (Generic) 3 x Per Week/30 Days Discharge Instructions: As directed: dry, Secured With: Tubigrip Size D, 3x10 (in/yd) 3 x Per Week/30 Days Wound #8 - Lower Leg Wound Laterality: Left, Lateral, Distal Cleanser: Byram Ancillary Kit - 15 Day Supply (Generic) 3 x Per Week/30 Days Discharge Instructions: Use supplies as instructed; Kit contains: (15) Saline Bullets; (15) 3x3 Gauze; 15 pr Gloves Cleanser: Soap and Water 3 x Per Week/30 Days Discharge Instructions: Gently cleanse wound with antibacterial soap, rinse and pat dry prior to dressing wounds Cleanser: Wound Cleanser 3 x Per Week/30 Days Discharge Instructions: Wash your hands with soap and water. Remove old dressing, discard into plastic bag and place into trash. Cleanse the wound with Wound Cleanser prior to applying a clean dressing using gauze  sponges, not tissues or cotton balls. Do not scrub or use excessive force. Pat dry using gauze sponges, not tissue or cotton balls. Prim Dressing: Silvercel 4 1/4x 4 1/4 (in/in) (Generic) 3 x Per Week/30 Days ary Discharge Instructions: Apply Silvercel 4 1/4x 4 1/4 (in/in) as instructed Secondary Dressing: Gauze (Generic) 3 x Per Week/30 Days Discharge Instructions: As directed: dry, Secured With: Tubigrip Size D, 3x10 (in/yd) 3 x Per Week/30 Days Electronic Signature(s) Signed: 07/27/2022 5:00:04 PM By: Worthy Keeler PA-C Signed: 07/31/2022 5:32:10 PM By: [name] Entered By: [name] on 07/27/2022 13:31:26 -------------------------------------------------------------------------------- Problem List Details Patient Name: Date of Service: Sarah Shi Weiss. 07/27/2022 12:45 PM Medical Record Number: [id number] Patient Account Number: [id number] Date of Birth/Sex: Treating RN: 09/09/1934 (88 y.o. [name] Primary Care Provider: Fulton Weiss Other Clinician: Massie Weiss Referring Provider: Treating Provider/Extender: [name] in Treatment: 9 Active Problems ICD-10 Encounter Code Description Active Date MDM Diagnosis I87.333 Chronic venous hypertension (idiopathic) with ulcer and inflammation of 05/23/2022 No Yes [name], [name] ([id number]) [id number] Page 6 of 10 bilateral lower extremity L97.812 Non-pressure chronic ulcer of other part of right lower leg with fat layer 05/23/2022 No Yes exposed L97.822 Non-pressure chronic ulcer of other part of left lower leg with fat layer exposed8/22/2023 No Yes I10 Essential (primary) hypertension 05/23/2022 No Yes Z99.2 Dependence on renal dialysis 05/23/2022 No Yes I12.0 Hypertensive chronic kidney disease with stage 5 chronic kidney disease or 05/23/2022 No Yes end stage renal disease Inactive Problems Resolved Problems Electronic Signature(s) Signed:  07/27/2022 1:06:11 PM By: Worthy Keeler PA-C Entered By: Worthy Keeler on 07/27/2022 13:06:10 -------------------------------------------------------------------------------- Progress Note Details Patient Name: Date of Service: Sarah Shi Weiss. 07/27/2022 12:45 PM Medical Record Number: [id number] Patient Account Number: [id number] Date of Birth/Sex: Treating RN: 01-30-1934 (88 y.o. [name] Primary Care Provider: Fulton Weiss Other Clinician: Massie Weiss Referring Provider: Treating Provider/Extender: [name] in Treatment: 9 Subjective Chief Complaint Information obtained from Patient  Bilateral LE Ulcers History of Present Illness (HPI) ADMISSION 07/14/2020. This is a medically complex 88 year old woman who is sent over urgently from her primary care doctor Dr. Doy Weiss at [facility] and we worked her in this afternoon. She is apparently developed a blister on the dorsal foot sometime over the last 2 Weiss on the right which seems to have developed a hemorrhagic component to this. The history here is somewhat vague but this is happened since she left the hospital on 07/02/2020. She also has a blistered area on the left dorsal foot although this does not have a hemorrhagic component to it and it is not threatened to open where is the right side is definitely leaking fluid and could easily break down. I do not believe she has been doing anything specific to this. With regards to her medical history I have reviewed the discharge summary from the hospital from 06/22/2020 through 07/02/2020. 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 She was given prednisone but was not felt to be a candidate for immunosuppression secondary to her age and comorbidities. She is ANA positive p-ANCA positive. She had a right IJ catheter placed on 9/24 and is being done on dialysis.  She is a diabetic. She takes prednisone 20 mg twice daily. She was on aspirin and Plavix although I think these have been put on hold. [name], [name] ([id number]) [id number] Page 7 of 10 Past medical history includes; pauci-immune crescentic glomerulonephritis. History of peripheral edema and type 2 diabetes left carotid artery stenosis with a history of a CVA, coronary artery disease and hypertension. She states that she tolerates dialysis reasonably poorly secondary to "butt cramps". She says that when she gets discomfort they stop her dialysis. This probably has something to do with her fluid overloaded state I cannot see any arterial evaluation in her record in [facility]. We could not do arterial ABIs on her because of complaints of discomfort 10/20; 1 week follow-up. This is a patient who is a diabetic. She recently developed acute renal failure because of pauci-immune glomerulonephritis. Sometime during her initial hospitalization she developed a wound on her right foot tremendous swelling in both legs. She was sent over here urgently last week by her primary care physician with what looks to be hematoma still contained on the right dorsal foot. We put silver alginate on this and put her in compression to control some of the swelling. The left leg had a similar appearance with severe edema in the left dorsal foot so we put that under compression as well. When she arrived back for nurse follow-up on Friday there was some drainage coming out of the wound which our nurse cultured. This is come back showing Pseudomonas Enterococcus faecalis and staph aureus although the identification of the staph aureus is not complete. I empirically put her on cefdinir 300 mg after dialysis on Tuesday Thursday and Saturday she is only had one dose. She has not been systemically unwell 10/27 1 week follow-up. With considerable help of her nursing staff I was finally able to  speak to her nephrologist who manages her at dialysis. Started her on vancomycin and ceftazidime. This to cover the combination of methicillin sensitive staph aureus Enterococcus faecalis and Pseudomonas. Although the staph aureus was methicillin sensitive and the Enterococcus ampicillin sensitive vancomycin provided the best alternative here that can be given at dialysis and ceftazidime to cover the Pseudomonas. She had a traumatic wound on  the top of her foot and a complicated abscess. She has significant tissue destruction from this although the wound is looking a lot better today. We are using silver alginate to the surface. X-ray of the foot did not show any deep obvious infection or osteomyelitis 12/1; since the patient was last here she was hospitalized from 11/3 through 11/10 with coag negative staphylococcal bacteremia. I do not know the the exact cause of this was determined. She was seen by vascular and they replaced her permacath on the right side of her neck. Her repeat blood cultures were negative. She is still on IV vancomycin ando Ceftazidime at dialysis. She generally feels systemically well she does not have a fever she is still receiving dialysis but reports she voids up to 8 times a day She has home health changing the dressing I believe they are using silver alginate under 3 layer compression. In addition to the large wound on her right anterior foot that we initially saw her for she has apparently a right lateral leg wound that she suffered during a fall and 2 skin tears on the left anterior lower leg 12/8; patient comes in looking a lot better this week. One of the 2 skin tears she has on the left leg is healed the other is smaller. The area on the right lateral leg looks improved and her original wound on the right dorsal foot is a lot smaller and looks a lot better. We used Hydrofera Blue to all wound areas under 3 layer compression.. Apparently home health had some trouble  with the compression wraps. 12/15 patient comes in with all of her wounds smaller including her original deep punched out wound on the right dorsal foot as well as the skin tears on the right posterior lateral calf and the left anterior lower leg. We have been using Hydrofera Blue under compression excellent results 10/12/2020 upon evaluation today patient appears to be doing decently well currently in regard to her foot ulcer. She does have a small hole still open with some tunneling that really goes proximally in the foot at around the 12:00 location. There is really not significant undermining other locations. This appears to be a small area of where there was a little fluid trapped. Nonetheless I do feel like this is still doing quite well and in general I think that she is going to benefit from possibly packing into this area with endoform in order to keep the area open while it granulates in. 11/09/2020 upon evaluation today patient appears to be doing really about the same in regard to her wound. There is a little reopening towards the distal portion of the wound bed but again I think this is just more as a result of the dressing possibly getting stuck even to little bit of the scar tissue nonetheless it also anything looks worse although I am not sure the endoform is really doing the job for her. I think we may want to try something a little different here I think packing with a silver alginate dressing may be optimal as compared to what we tried at this point. Is been almost a month since have seen her and we really have not seen dramatic improvement. 12/07/2020 upon evaluation today patient appears to be doing well with regard to the dorsal surface of her foot. Overall I feel like this looks to be completely healed today and this is excellent news. There is no evidence of active infection which is great news. No  fevers, chills, nausea, vomiting, or diarrhea. READMISSION 01/26/2022 This is an  88 year old woman that we had in clinic October 21 through March 22 with bilateral lower extremity wounds on the dorsal feet at that time she had pauci-immune glomerulonephritis but was not yet on dialysis. We managed to heal her out. As I remember she had severe bilateral lower extremity edema secondary to acute on chronic renal failure. She arrives in clinic this time with a wound on her distal left dorsal foot that is been there for about 6 months. Completely eschar covered she has been treating this with Neosporin. She said she is on IV vancomycin and cefepime at dialysis although we have no independent verification of this. This is due to be finished on 01/30/2022. She says it was for this foot but she does not say that she has had x-rays and MRI etc. Past medical history is extensive; she has type 2 diabetes although not currently in any treatment, end-stage renal disease on dialysis Monday Wednesday and Friday, coronary artery disease status post CABG, combined systolic and diastolic heart failure, peripheral vascular disease, cerebrovascular disease. Carotid artery stenosis, COPD, breast cancer ABI in our clinic was 0.79. I cannot find that she has had previous noninvasive arterial studies. She is only able to walk around the house does not describe claudication that I can elicit Upon inspection patient's wounds actually appear to be doing decently well. On the left foot this is mainly dry drainage, bleeding, and Iodoflex which is stuck on the surface of the wound I was able to get that off mechanically she did not want any sharp debridement whatsoever fortunately is able to get it off mechanically without using that just using saline and gauze. Subsequently I think this is otherwise doing quite well. On the right leg she does have swelling and she had a blister that popped up this feels like it almost completely sealed down but it still weeping and leaking a little bit I think we need to manage  that as well to be honest. 02-14-2022 upon evaluation today patient appears to be doing well currently in regard to the wound on her foot. She has been tolerating the dressing changes there is a little bit of film buildup here in the patient last week was very resistant to debridement because her ABIs were somewhat low I was avoiding that as well. However she did see Dr. Lazaro Arms and he apparently told her that her ABIs were good everything appeared to be fine and to be honest I think this is excellent news. I am very pleased to hear this and I do believe doing some sharp debridement would be helpful at this point as well. 02-21-2022 upon evaluation today patient appears to be doing well with regard to her wounds. Both are showing signs of improvement this is great news. Fortunately I do not see any evidence of active infection locally or systemically at this time which is great news. No fevers, chills, nausea, vomiting, or diarrhea. 03-02-2022 upon evaluation today patient appears to be doing better in regard to her right leg which appears to be healed. In regards to her left leg this on the foot at the top still has a small opening at this time. Fortunately there does not appear to be any signs of infection. With that being said I do think we may want to switch over to collagen based dressing to see if we get this to feeling a little bit better. 03-09-2022 upon evaluation today patient  appears to be doing well currently in regard to her wound on the top of her left foot. I am very pleased with where things stand and I think we are headed in the right direction here. She is still having a lot of trouble with the compression wrap. She is doing extremely well on the right side with the Tubigrip so I think we can probably switch over to using the Tubigrip on the left side as well especially since the collagen is getting so dry I do not believe this is doing her as much good as I would like for her to to be  honest. 03-23-2022 upon evaluation today patient appears to be doing well with regard to her foot ulcer. This is pretty much healed at this point. Fortunately I see no [name], [name] ([id number]) [id number] Page 8 of 10 signs of infection which is great news. The biggest issue I think is good to be keeping her edema under control. The Tubigrip is not very tight but unfortunately rolls down the cuts and on the top of her leg I think she needs something to try to help keep this under better control more evenly she has done well she tells me in the past with an Ace wrap I think we can probably give that a shot. Readmission: 05-23-2022 upon evaluation today patient presents for reevaluation here in the clinic she was last seen June 23, 2022. At that time we got her compression wraps with juxta lites. Unfortunately she did not wear these appropriately she tells me that they were unable to get them on. I am not really sure why that is and to be peripherally honest I think that this to some degree is just a matter of they let her legs get too swollen and out-of-control and then subsequently ended up with it getting to the point that the juxta lites will be been fit. Either way based on what I am seeing at this time I do believe that the patient has been required compression here in the office to get this under control I think the 3 layer compression wraps probably the right way to go she is not going to tolerate anything too tight and the 4-layer I think would be much too tight for her. Patient's past medical history really has not changed she does have hypertension, end-stage renal disease, and she is on renal dialysis. Unfortunately they are not able to remove so much fluid as it throws her entire electrolyte imbalance that subsequently causes extreme cramping. 05-30-2022 upon evaluation today patient appears to be doing about the same in regard to her wounds. She needs to  have some compression but she took off the 3 layer compression wrap she tells me that it was about to make a fall several times and she could not continue to deal with that. She is states that there is no way she can afford to break anything which I completely understand. With that being said I do believe that the patient should likely have some type of compression on she told me that "she is not here for me to fix her fluid problems she is here for me to fix her wounds." With that being said I explained to the patient that there is not any way to do 1 without the other because the fluid is exacerbating the wound. She did not have much to say that to be honest. 06-08-2022 upon evaluation today patient appears to be doing well with regard  to her legs. I do see signs of improvement she did keep the Tubigrip on and her and her husband have been doing this which is extremely good for her at this point. I do not see any signs of active infection which is great news and overall I think she is doing well in that regard. I do believe that keeping the edema under some control even if its not perfect is better than nothing. She just did not tolerate the compression wraps at all. 06-15-2022 upon evaluation today patient appears to be doing excellent in regard to her wound on each leg. She has been tolerating the dressing changes without complication and overall seems to be doing quite well. I do not see any signs of active infection locally or systemically at this point. 06-22-2022 upon evaluation today patient appears to be doing well currently in regard to her wound. She has been tolerating the dressing changes without complication. Fortunately there does not appear to be any evidence of active infection at this time which is great news. No fevers, chills, nausea, vomiting, or diarrhea. 06-29-2022 upon evaluation today patient appears to be doing well currently in regard to her wounds. She is actually showing signs  of significant improvement which is great news and overall I am extremely pleased with where we stand currently. There does not appear to be any evidence of active infection at this time. 10-5-2023Upon evaluation today patient actually appears to be doing much better at this time in regard to some of the wounds although the one on the right lateral/distal location has opened unfortunately. Again this is something we will watch him last week I was hoping it would just reabsorb but that was definitely not what happened. Fortunately I do not see any evidence of infection locally or systemically at this time. 07-27-2022 in general patient's wounds in all locations appear to be doing significantly better which is great news and overall I am extremely pleased with where we stand today. I do not see any evidence of active infection locally or systemically at this time. Objective Constitutional Well-nourished and well-hydrated in no acute distress. Vitals Time Taken: 12:55 PM, Height: 64 in, Weight: 166 lbs, BMI: 28.5, Temperature: 98.1 F, Pulse: 71 bpm, Respiratory Rate: 18 breaths/min, Blood Pressure: 160/86 mmHg. Respiratory normal breathing without difficulty. Psychiatric this patient is able to make decisions and demonstrates good insight into disease process. Alert and Oriented x 3. pleasant and cooperative. General Notes: Upon inspection patient's wounds again did not require any sharp debridement she did really does not allow me to debride anyway I did mechanically debride with saline and gauze and postdebridement this appears better but no sharp debridement performed. Overall I do feel like she is making progress but with the swelling in her legs as well as the overall types of wounds that she has here I definitely think she is still needs to try to elevate her legs much as possible and still use the Tubigrip which she seems to be doing excellent with. Integumentary (Hair, Skin) Wound #10  status is Open. Original cause of wound was Gradually Appeared. The date acquired was: 07/05/2022. The wound has been in treatment 3 Weiss. The wound is located on the Right,Lateral Lower Leg. The wound measures 1.9cm length x 1cm width x 0.2cm depth; 1.492cm^2 area and 0.298cm^3 volume. There is Fat Layer (Subcutaneous Tissue) exposed. There is a medium amount of serous drainage noted. There is no granulation within the wound bed. There is a large (  67-100%) amount of necrotic tissue within the wound bed including Adherent Slough. Wound #7 status is Open. Original cause of wound was Trauma. The date acquired was: 04/19/2022. The wound has been in treatment 9 Weiss. The wound is located on the Right,Medial Lower Leg. The wound measures 1.1cm length x 2.9cm width x 0.2cm depth; 2.505cm^2 area and 0.501cm^3 volume. There is a medium amount of serosanguineous drainage noted. Wound #8 status is Open. Original cause of wound was Trauma. The date acquired was: 04/24/2022. The wound has been in treatment 9 Weiss. The wound is located on the Left,Distal,Lateral Lower Leg. The wound measures 0.2cm length x 0.3cm width x 0.1cm depth; 0.047cm^2 area and 0.005cm^3 volume. There is a medium amount of serosanguineous drainage noted. 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 Bathing/ Shower/ Hygiene: May shower with wound dressing protected with water repellent  cover or cast protector. Anesthetic (Use 'Patient Medications' Section for Anesthetic Order Entry): Lidocaine applied to wound bed Edema Control - Lymphedema / Segmental Compressive Device / Other: Tubigrip single layer applied. - size D Elevate, Exercise Daily and Avoid Standing for Long Periods of Time. Elevate legs to the level of the heart and pump ankles as often as possible Elevate leg(s) parallel to the floor when sitting. WOUND #10: - Lower Leg Wound Laterality: Right, Lateral Cleanser: Byram Ancillary Kit - 15 Day Supply (Generic) 3 x Per Week/30 Days Discharge Instructions: Use supplies as instructed; Kit contains: (15) Saline Bullets; (15) 3x3 Gauze; 15 pr Gloves Cleanser: Soap and Water 3 x Per Week/30 Days Discharge Instructions: Gently cleanse wound with antibacterial soap, rinse and pat dry prior to dressing wounds Cleanser: Wound Cleanser 3 x Per Week/30 Days Discharge Instructions: Wash your hands with soap and water. Remove old dressing, discard into plastic bag and place into trash. Cleanse the wound with Wound Cleanser prior to applying a clean dressing using gauze sponges, not tissues or cotton balls. Do not scrub or use excessive force. Pat dry using gauze sponges, not tissue or cotton balls. Prim Dressing: Silvercel 4 1/4x 4 1/4 (in/in) (Generic) 3 x Per Week/30 Days ary Discharge Instructions: Apply Silvercel 4 1/4x 4 1/4 (in/in) as instructed Secondary Dressing: Gauze (Generic) 3 x Per Week/30 Days Discharge Instructions: As directed: dry, Secured With: Tubigrip Size D, 3x10 (in/yd) 3 x Per Week/30 Days WOUND #7: - Lower Leg Wound Laterality: Right, Medial Cleanser: Byram Ancillary Kit - 15 Day Supply (Generic) 3 x Per Week/30 Days Discharge Instructions: Use supplies as instructed; Kit contains: (15) Saline Bullets; (15) 3x3 Gauze; 15 pr Gloves Cleanser: Soap and Water 3 x Per Week/30 Days Discharge Instructions: Gently cleanse wound with antibacterial soap, rinse  and pat dry prior to dressing wounds Cleanser: Wound Cleanser 3 x Per Week/30 Days Discharge Instructions: Wash your hands with soap and water. Remove old dressing, discard into plastic bag and place into trash. Cleanse the wound with Wound Cleanser prior to applying a clean dressing using gauze sponges, not tissues or cotton balls. Do not scrub or use excessive force. Pat dry using gauze  sponges, not tissue or cotton balls. Prim Dressing: Silvercel 4 1/4x 4 1/4 (in/in) (Generic) 3 x Per Week/30 Days ary Discharge Instructions: Apply Silvercel 4 1/4x 4 1/4 (in/in) as instructed Secondary Dressing: Gauze (Generic) 3 x Per Week/30 Days Discharge Instructions: As directed: dry, Secured With: Tubigrip Size D, 3x10 (in/yd) 3 x Per Week/30 Days WOUND #8: - Lower Leg Wound Laterality: Left, Lateral, Distal Cleanser: Byram Ancillary Kit - 15 Day Supply (Generic) 3 x Per Week/30 Days Discharge Instructions: Use supplies as instructed; Kit contains: (15) Saline Bullets; (15) 3x3 Gauze; 15 pr Gloves Cleanser: Soap and Water 3 x Per Week/30 Days Discharge Instructions: Gently cleanse wound with antibacterial soap, rinse and pat dry prior to dressing wounds Cleanser: Wound Cleanser 3 x Per Week/30 Days Discharge Instructions: Wash your hands with soap and water. Remove old dressing, discard into plastic bag and place into trash. Cleanse the wound with Wound Cleanser prior to applying a clean dressing using gauze sponges, not tissues or cotton balls. Do not scrub or use excessive force. Pat dry using gauze sponges, not tissue or cotton balls. Prim Dressing: Silvercel 4 1/4x 4 1/4 (in/in) (Generic) 3 x Per Week/30 Days ary Discharge Instructions: Apply Silvercel 4 1/4x 4 1/4 (in/in) as instructed Secondary Dressing: Gauze (Generic) 3 x Per Week/30 Days Discharge Instructions: As directed: dry, Secured With: Tubigrip Size D, 3x10 (in/yd) 3 x Per Week/30 Days 1. I am going to recommend that we have the  patient continue with the silver alginate dressing to all wound locations this seems to be doing excellent. 2. I would recommend as well that we continue with the ABD pads or gauze to cover followed by Tubigrip size D which is doing quite well. We will see patient back for reevaluation in 3 Weiss here in the clinic. If anything worsens or changes patient will contact our office for additional recommendations. Electronic Signature(s) Signed: 07/27/2022 4:51:32 PM By: Worthy Keeler PA-C Entered By: Worthy Keeler on 07/27/2022 16:51:31 [name] ([id number]) [id number] Page 10 of 10 -------------------------------------------------------------------------------- SuperBill Details Patient Name: Date of Service: [name], [name] 07/27/2022 Medical Record Number: [id number] Patient Account Number: [id number] Date of Birth/Sex: Treating RN: Aug 11, 1934 (88 y.o. 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 Facility Procedures : CPT4 Code: [id number] Description: [place] VISIT-LEV 4 EST PT Modifier: Quantity: 1 Physician Procedures : CPT4 Code Description Modifier [id number] - WC PHYS LEVEL 3 - EST PT ICD-10 Diagnosis Description I87.333 Chronic venous hypertension (idiopathic) with ulcer and  inflammation of bilateral lower extremity L97.812 Non-pressure chronic ulcer of other  part of right lower leg with fat layer exposed L97.822 Non-pressure chronic ulcer of other part of left lower leg with fat layer exposed I10 Essential (primary) hypertension Quantity: 1 Electronic Signature(s) Signed: 07/27/2022 4:52:03 PM By: Worthy Keeler PA-C Entered By: Worthy Keeler on 07/27/2022 16:52:03

## 2022-08-01 NOTE — Progress Notes (Signed)
ORIANA, HORIUCHI T ([id number]) [id number] Page 1 of 12 Visit Report for 07/27/2022 Arrival Information Details Patient Name: Date of Service: BRANA[place], [place] 07/27/2022 12:45 PM Medical Record Number: [id number] Patient Account Number: [id number] Date of Birth/Sex: Treating RN: Feb 08, 1934 (88 y.o. [name] Primary Care Dylyn Mclaren: Fulton Reek Other Clinician: Massie Kluver Referring Zanyiah Posten: Treating Romell Wolden/Extender: Rupert Stacks in Treatment: 9 Visit Information History Since Last Visit All ordered tests and consults were completed: No Patient Arrived: Wheel Chair Added or deleted any medications: No Arrival Time: 12:54 Any new allergies or adverse reactions: No Transfer Assistance: EasyPivot Patient Lift Had a fall or experienced change in No Patient Requires Transmission-Based Precautions: No activities of daily living that may affect Patient Has Alerts: Yes risk of falls: Patient Alerts: Patient on Blood Thinner Hospitalized since last visit: No DIABETIC Pain Present Now: No ABI LEFT .79 02/14/22 ABI RIGHT 1.02 02/14/22 Electronic Signature(s) Signed: 07/31/2022 5:32:10 PM By: Massie Kluver Entered By: Massie Kluver on 07/27/2022 12:54:47 -------------------------------------------------------------------------------- Clinic Level of Care Assessment Details Patient Name: Date of Service: Rehmat, [place] 07/27/2022 12:45 PM Medical Record Number: [id number] Patient Account Number: [id number] Date of Birth/Sex: Treating RN: Nov 21, 1933 (88 y.o. [name] Primary Care Josephine Rudnick: Fulton Reek Other Clinician: Massie Kluver Referring Veto Macqueen: Treating Johnathan Tortorelli/Extender: Rupert Stacks in Treatment: 9 Clinic Level of Care Assessment Items TOOL 4 Quantity Score _0  - 0 Use when only an EandM is performed on FOLLOW-UP visit ASSESSMENTS - Nursing Assessment / Reassessment X- 1  10 Reassessment of Co-morbidities (includes updates in patient status) X- 1 5 Reassessment of Adherence to Treatment Plan ASSESSMENTS - Wound and Skin A ssessment / Reassessment _1  - 0 Simple Wound Assessment / Reassessment - one wound X- 3 5 Complex Wound Assessment / Reassessment - multiple wounds ZOEE, HEENEY T ([id number]) [id number] Page 2 of 12 _2  - 0 Dermatologic / Skin Assessment (not related to wound area) ASSESSMENTS - Focused Assessment X- 2 5 Circumferential Edema Measurements - multi extremities _3  - 0 Nutritional Assessment / Counseling / Intervention _4  - 0 Lower Extremity Assessment (monofilament, tuning fork, pulses) _5  - 0 Peripheral Arterial Disease Assessment (using hand held doppler) ASSESSMENTS - Ostomy and/or Continence Assessment and Care _6  - 0 Incontinence Assessment and Management _7  - 0 Ostomy Care Assessment and Management (repouching, etc.) PROCESS - Coordination of Care X - Simple Patient / Family Education for ongoing care 1 15 _8  - 0 Complex (extensive) Patient / Family Education for ongoing care _9  - 0 Staff obtains Programmer, systems, Records, T Results / Process Orders est _10  - 0 Staff telephones HHA, Nursing Homes / Clarify orders / etc _11  - 0 Routine Transfer to another Facility (non-emergent condition) _12  - 0 Routine Hospital Admission (non-emergent condition) _13  - 0 New Admissions / Biomedical engineer / Ordering NPWT Apligraf, etc. , _14  - 0 Emergency Hospital Admission (emergent condition) X- 1 10 Simple Discharge Coordination _15  - 0 Complex (extensive) Discharge Coordination PROCESS - Special Needs _16  - 0 Pediatric / Minor Patient Management _17  - 0 Isolation Patient Management _18  - 0 Hearing / Language / Visual special needs _19  - 0 Assessment of Community assistance (transportation, D/C planning, etc.) _20  - 0 Additional assistance / Altered mentation _21  - 0 Support Surface(s) Assessment  (bed, cushion, seat, etc.) INTERVENTIONS - Wound Cleansing / Measurement _22  - 0 Simple Wound Cleansing - one wound X- 3 5 Complex Wound Cleansing - multiple wounds X- 1 5 Wound Imaging (photographs -  any number of wounds) _0  - 0 Wound Tracing (instead of photographs) _1  - 0 Simple Wound Measurement - one wound X- 3 5 Complex Wound Measurement - multiple wounds INTERVENTIONS - Wound Dressings _2  - 0 Small Wound Dressing one or multiple wounds X- 3 15 Medium Wound Dressing one or multiple wounds _3  - 0 Large Wound Dressing one or multiple wounds <ZTIWPYKDXIPJASNK>_5<\/LZJQBHALPFXTKWIO>_9  - 0 Application of Medications - topical <BDZHGDJMEQASTMHD>_6<\/QIWLNLGXQJJHERDE>_0  - 0 Application of Medications - injection INTERVENTIONS - Miscellaneous _6  - 0 External ear exam _7  - 0 Specimen Collection (cultures, biopsies, blood, body fluids, etc.) _8  - 0 Specimen(s) / Culture(s) sent or taken to Lab for analysis JORA, GALLUZZO T ([id number]) [id number] Page 3 of 12 _9  - 0 Patient Transfer (multiple staff / Civil Service fast streamer / Similar devices) _10  - 0 Simple Staple / Suture removal (25 or less) _11  - 0 Complex Staple / Suture removal (26 or more) _12  - 0 Hypo / Hyperglycemic Management (close monitor of Blood Glucose) _13  - 0 Ankle / Brachial Index (ABI) - do not check if billed separately X- 1 5 Vital Signs Has the patient been seen at the hospital within the last three years: Yes Total Score: 150 Level Of Care: New/Established - Level 4 Electronic Signature(s) Signed: 07/31/2022 5:32:10 PM By: Massie Kluver Entered By: Massie Kluver on 07/27/2022 13:32:18 -------------------------------------------------------------------------------- Encounter Discharge Information Details Patient Name: Date of Service: Liliane Shi T. 07/27/2022 12:45 PM Medical Record Number: [id number] Patient Account Number: [id number] Date of Birth/Sex: Treating RN: Nov 04, 1933 (88 y.o. [name] Primary Care Sarae Nicholes: Fulton Reek Other  Clinician: Massie Kluver Referring Eleni Frank: Treating Dayonna Selbe/Extender: Rupert Stacks in Treatment: 9 Encounter Discharge Information Items Discharge Condition: Stable Ambulatory Status: Wheelchair Discharge Destination: Home Transportation: Private Auto Accompanied By: husband Schedule Follow-up Appointment: Yes Clinical Summary of Care: Electronic Signature(s) Signed: 07/31/2022 5:32:10 PM By: Massie Kluver Entered By: Massie Kluver on 07/27/2022 13:33:26 -------------------------------------------------------------------------------- Lower Extremity Assessment Details Patient Name: Date of Service: NYIMAH, [place] 07/27/2022 12:45 PM Medical Record Number: [id number] Patient Account Number: [id number] Date of Birth/Sex: Treating RN: 05/21/1934 (88 y.o. [name] Primary Care Allean Montfort: Fulton Reek Other Clinician: Massie Kluver Referring Chanae Gemma: Treating TRUE Garciamartinez/Extender: Maurilio Lovely [place], [place] T ([id number]) [phone].pdf Page 4 of 12 Weeks in Treatment: 9 Edema Assessment Assessed: [Left: Yes] [Right: Yes] Edema: [Left: Yes] [Right: Yes] Calf Left: Right: Point of Measurement: 32 cm From Medial Instep 39.6 cm 38.5 cm Ankle Left: Right: Point of Measurement: 10 cm From Medial Instep 29 cm 30.2 cm Vascular Assessment Pulses: Dorsalis Pedis Palpable: [Left:Yes] [Right:Yes] Posterior Tibial Palpable: [Left:Yes] [Right:Yes] Electronic Signature(s) Signed: 07/27/2022 4:06:10 PM By: Gretta Cool, BSN, RN, CWS, Kim RN, BSN Signed: 07/31/2022 5:32:10 PM By: Massie Kluver Entered By: Massie Kluver on 07/27/2022 13:13:40 -------------------------------------------------------------------------------- Multi Wound Chart Details Patient Name: Date of Service: Liliane Shi T. 07/27/2022 12:45 PM Medical Record Number: [id number] Patient Account Number: [id number] Date of Birth/Sex: Treating  RN: 08-28-1934 (88 y.o. [name] Primary Care Kirsi Hugh: Fulton Reek Other Clinician: Massie Kluver Referring Easten Maceachern: Treating Vincent Ehrler/Extender: Maurilio Lovely Weeks in Treatment: 9 Vital Signs Height(in): 64 Pulse(bpm): 71 Weight(lbs): 166 Blood Pressure(mmHg): 160/86 Body Mass Index(BMI): 28.5 Temperature(F): 98.1 Respiratory Rate(breaths/min): 18 [10:Photos:] Right, Lateral Lower Leg Right, Medial Lower Leg Left, Distal, Lateral Lower Leg Wound Location: Gradually Appeared Trauma Trauma Wounding Event: Venous Leg Ulcer Diabetic Wound/Ulcer of the Lower Diabetic Wound/Ulcer of the Lower Primary Etiology: Extremity Extremity TEGHAN, PHILBIN T ([id number])  [id number] Page 5 of 12 Congestive Heart Failure, Congestive Heart Failure, Congestive Heart Failure, Comorbid History: Hypertension, Type II Diabetes, Hypertension, Type II Diabetes, Hypertension, Type II Diabetes, Received Radiation Received Radiation Received Radiation 07/05/2022 04/19/2022 04/24/2022 Date Acquired: _0 Weeks of Treatment: Open Open Open Wound Status: No No No Wound Recurrence: 1.9x1x0.2 1.1x2.9x0.2 0.2x0.3x0.1 Measurements L x W x D (cm) 1.492 2.505 0.047 A (cm) : rea 0.298 0.501 0.005 Volume (cm) : -75.90% 74.30% 98.00% % Reduction in Area: -3625.00% 74.30% 97.90% % Reduction in Volume: Full Thickness Without Exposed Grade 2 Grade 2 Classification: Support Structures Medium Medium Medium Exudate Amount: Serous Serosanguineous Serosanguineous Exudate Type: amber red, brown red, brown Exudate Color: None Present (0%) N/A N/A Granulation Amount: Large (67-100%) N/A N/A Necrotic Amount: Fat Layer (Subcutaneous Tissue): Yes N/A N/A Exposed Structures: Fascia: No Tendon: No Muscle: No Joint: No Bone: No None N/A N/A Epithelialization: Treatment Notes Electronic Signature(s) Signed: 07/31/2022 5:32:10 PM By: Massie Kluver Entered By: Massie Kluver on 07/27/2022 13:29:31 -------------------------------------------------------------------------------- Multi-Disciplinary Care Plan Details Patient Name: Date of Service: Liliane Shi T. 07/27/2022 12:45 PM Medical Record Number: [id number] Patient Account Number: [id number] Date of Birth/Sex: Treating RN: August 28, 1934 (88 y.o. [name] Primary Care Abagael Kramm: Fulton Reek Other Clinician: Massie Kluver Referring Stillman Buenger: Treating Deairra Halleck/Extender: Rupert Stacks in Treatment: 9 Active Inactive Necrotic Tissue Nursing Diagnoses: Impaired tissue integrity related to necrotic/devitalized tissue Knowledge deficit related to management of necrotic/devitalized tissue Goals: Necrotic/devitalized tissue will be minimized in the wound bed Date Initiated: 07/06/2022 Target Resolution Date: 07/06/2022 Goal Status: Active Patient/caregiver will verbalize understanding of reason and process for debridement of necrotic tissue Date Initiated: 07/06/2022 Target Resolution Date: 07/06/2022 Goal Status: Active Interventions: Assess patient pain level pre-, during and post procedure and prior to discharge Provide education on necrotic tissue and debridement process SHAI, RASMUSSEN T ([id number]) [id number] Page 6 of 12 Treatment Activities: Excisional debridement : 07/06/2022 Notes: Wound/Skin Impairment Nursing Diagnoses: Knowledge deficit related to ulceration/compromised skin integrity Goals: Patient/caregiver will verbalize understanding of skin care regimen Date Initiated: 05/23/2022 Target Resolution Date: 07/23/2022 Goal Status: Active Ulcer/skin breakdown will have a volume reduction of 30% by week 4 Date Initiated: 05/23/2022 Date Inactivated: 06/29/2022 Target Resolution Date: 06/23/2022 Goal Status: Unmet Unmet Reason: comorbdities Ulcer/skin breakdown will have a volume reduction of 50% by  week 8 Date Initiated: 05/23/2022 Target Resolution Date: 07/23/2022 Goal Status: Active Ulcer/skin breakdown will have a volume reduction of 80% by week 12 Date Initiated: 05/23/2022 Target Resolution Date: 08/23/2022 Goal Status: Active Ulcer/skin breakdown will heal within 14 weeks Date Initiated: 05/23/2022 Target Resolution Date: 09/22/2022 Goal Status: Active Interventions: Assess patient/caregiver ability to obtain necessary supplies Assess patient/caregiver ability to perform ulcer/skin care regimen upon admission and as needed Assess ulceration(s) every visit Notes: Electronic Signature(s) Signed: 07/27/2022 4:06:10 PM By: Gretta Cool, BSN, RN, CWS, Kim RN, BSN Signed: 07/31/2022 5:32:10 PM By: Massie Kluver Entered By: Massie Kluver on 07/27/2022 13:13:45 -------------------------------------------------------------------------------- Pain Assessment Details Patient Name: Date of Service: Liliane Shi T. 07/27/2022 12:45 PM Medical Record Number: [id number] Patient Account Number: [id number] Date of Birth/Sex: Treating RN: 1934-03-04 (88 y.o. [name] Primary Care Muntaha Vermette: Fulton Reek Other Clinician: Massie Kluver Referring Heidee Audi: Treating Maximum Reiland/Extender: Maurilio Lovely Weeks in Treatment: 9 Active Problems Location of Pain Severity and Description of Pain Patient Has Paino No Site Locations [place], [place] T ([id number]) [id number] Page 7 of 12 Pain Management and Medication Current Pain Management: Electronic Signature(s) Signed: 07/27/2022  4:06:10 PM By: Gretta Cool, BSN, RN, CWS, Kim RN, BSN Signed: 07/31/2022 5:32:10 PM By: Massie Kluver Entered By: Massie Kluver on 07/27/2022 12:58:48 -------------------------------------------------------------------------------- Patient/Caregiver Education Details Patient Name: Date of Service: Ernestine Conrad 10/26/2023andnbsp12:45 PM Medical Record Number:  [id number] Patient Account Number: [id number] Date of Birth/Gender: Treating RN: [birth year] (88 y.o. [name] Primary Care Physician: Fulton Reek Other Clinician: Massie Kluver Referring Physician: Treating Physician/Extender: Rupert Stacks in Treatment: 9 Education Assessment Education Provided To: Patient Education Topics Provided Wound/Skin Impairment: Handouts: Other: continue wound care as directed Methods: Explain/Verbal Responses: State content correctly Electronic Signature(s) Signed: 07/31/2022 5:32:10 PM By: Massie Kluver Entered By: Massie Kluver on 07/27/2022 13:32:51 [name] ([id number]) [id number] Page 8 of 12 -------------------------------------------------------------------------------- Wound Assessment Details Patient Name: Date of Service: Varsha, [place] 07/27/2022 12:45 PM Medical Record Number: [id number] Patient Account Number: [id number] Date of Birth/Sex: Treating RN: Jan 17, 1934 (88 y.o. Charolette Forward, Kim Primary Care Tajai Ihde: Fulton Reek Other Clinician: Massie Kluver Referring Zandyr Barnhill: Treating Sarinity Dicicco/Extender: Maurilio Lovely Weeks in Treatment: 9 Wound Status Wound Number: 10 Primary Venous Leg Ulcer Etiology: Wound Location: Right, Lateral Lower Leg Wound Open Wounding Event: Gradually Appeared Status: Date Acquired: 07/05/2022 Comorbid Congestive Heart Failure, Hypertension, Type II Diabetes, Weeks Of Treatment: 3 History: Received Radiation Clustered Wound: No Photos Wound Measurements Length: (cm) 1.9 Width: (cm) 1 Depth: (cm) 0.2 Area: (cm) 1.492 Volume: (cm) 0.298 % Reduction in Area: -75.9% % Reduction in Volume: -3625% Epithelialization: None Wound Description Classification: Full Thickness Without Exposed Support Structures Exudate Amount: Medium Exudate Type: Serous Exudate Color: amber Foul Odor After Cleansing:  No Slough/Fibrino Yes Wound Bed Granulation Amount: None Present (0%) Exposed Structure Necrotic Amount: Large (67-100%) Fascia Exposed: No Necrotic Quality: Adherent Slough Fat Layer (Subcutaneous Tissue) Exposed: Yes Tendon Exposed: No Muscle Exposed: No Joint Exposed: No Bone Exposed: No Treatment Notes Wound #10 (Lower Leg) Wound Laterality: Right, Lateral Cleanser Byram Ancillary Kit - 15 Day Supply Discharge Instruction: Use supplies as instructed; Kit contains: (15) Saline Bullets; (15) 3x3 Gauze; 15 pr Gloves Soap and [address] [place], Fiora T ([id number]) [id number] Page 9 of 12 Discharge Instruction: Gently cleanse wound with antibacterial soap, rinse and pat dry prior to dressing wounds Wound Cleanser Discharge Instruction: Wash your hands with soap and water. Remove old dressing, discard into plastic bag and place into trash. Cleanse the wound with Wound Cleanser prior to applying a clean dressing using gauze sponges, not tissues or cotton balls. Do not scrub or use excessive force. Pat dry using gauze sponges, not tissue or cotton balls. Peri-Wound Care Topical Primary Dressing Silvercel 4 1/4x 4 1/4 (in/in) Discharge Instruction: Apply Silvercel 4 1/4x 4 1/4 (in/in) as instructed Secondary Dressing Gauze Discharge Instruction: As directed: dry, Secured With Tubigrip Size D, 3x10 (in/yd) Compression Wrap Compression Stockings Add-Ons Electronic Signature(s) Signed: 07/27/2022 4:06:10 PM By: Gretta Cool, BSN, RN, CWS, Kim RN, BSN Signed: 07/31/2022 5:32:10 PM By: Massie Kluver Entered By: Massie Kluver on 07/27/2022 13:10:21 -------------------------------------------------------------------------------- Wound Assessment Details Patient Name: Date of Service: Liliane Shi T. 07/27/2022 12:45 PM Medical Record Number: [id number] Patient Account Number: [id number] Date of Birth/Sex: Treating RN: 01-Dec-1933 (88 y.o. [name] Primary Care Keene Gilkey: Fulton Reek Other Clinician: Massie Kluver Referring Lawana Hartzell: Treating Etty Isaac/Extender: Maurilio Lovely Weeks in Treatment: 9 Wound Status Wound Number: 7 Primary Diabetic Wound/Ulcer of the Lower Extremity Etiology: Wound Location: Right, Medial Lower Leg Wound Open Wounding Event: Trauma Status: Date Acquired: 04/19/2022  Comorbid Congestive Heart Failure, Hypertension, Type II Diabetes, Weeks Of Treatment: 9 History: Received Radiation Clustered Wound: No Photos YASAMAN, KOLEK T ([id number]) [id number] Page 10 of 12 Wound Measurements Length: (cm) 1.1 Width: (cm) 2.9 Depth: (cm) 0.2 Area: (cm) 2.505 Volume: (cm) 0.501 % Reduction in Area: 74.3% % Reduction in Volume: 74.3% Wound Description Classification: Grade 2 Exudate Amount: Medium Exudate Type: Serosanguineous Exudate Color: red, brown Treatment Notes Wound #7 (Lower Leg) Wound Laterality: Right, Medial Cleanser Byram Ancillary Kit - 15 Day Supply Discharge Instruction: Use supplies as instructed; Kit contains: (15) Saline Bullets; (15) 3x3 Gauze; 15 pr Gloves Soap and Water Discharge Instruction: Gently cleanse wound with antibacterial soap, rinse and pat dry prior to dressing wounds Wound Cleanser Discharge Instruction: Wash your hands with soap and water. Remove old dressing, discard into plastic bag and place into trash. Cleanse the wound with Wound Cleanser prior to applying a clean dressing using gauze sponges, not tissues or cotton balls. Do not scrub or use excessive force. Pat dry using gauze sponges, not tissue or cotton balls. Peri-Wound Care Topical Primary Dressing Silvercel 4 1/4x 4 1/4 (in/in) Discharge Instruction: Apply Silvercel 4 1/4x 4 1/4 (in/in) as instructed Secondary Dressing Gauze Discharge Instruction: As directed: dry, Secured With Tubigrip Size D, 3x10 (in/yd) Compression Wrap Compression  Stockings Add-Ons Electronic Signature(s) Signed: 07/27/2022 4:06:10 PM By: Gretta Cool, BSN, RN, CWS, Kim RN, BSN Signed: 07/31/2022 5:32:10 PM By: Massie Kluver Entered By: Massie Kluver on 07/27/2022 13:10:52 -------------------------------------------------------------------------------- Wound Assessment Details Patient Name: Date of Service: Liliane Shi T. 07/27/2022 12:45 PM Medical Record Number: [id number] Patient Account Number: [id number] Date of Birth/Sex: Treating RN: December 09, 1933 (88 y.o. [name] Primary Care Argus Caraher: Fulton Reek Other Clinician: Massie Kluver Referring Fitzroy Mikami: Treating Tawyna Pellot/Extender: Rupert Stacks in Treatment: [address], [place] T ([id number]) [id number] Page 11 of 12 Wound Status Wound Number: 8 Primary Diabetic Wound/Ulcer of the Lower Extremity Etiology: Wound Location: Left, Distal, Lateral Lower Leg Wound Open Wounding Event: Trauma Status: Date Acquired: 04/24/2022 Comorbid Congestive Heart Failure, Hypertension, Type II Diabetes, Weeks Of Treatment: 9 History: Received Radiation Clustered Wound: No Photos Wound Measurements Length: (cm) 0.2 Width: (cm) 0.3 Depth: (cm) 0.1 Area: (cm) 0.047 Volume: (cm) 0.005 % Reduction in Area: 98% % Reduction in Volume: 97.9% Wound Description Classification: Grade 2 Exudate Amount: Medium Exudate Type: Serosanguineous Exudate Color: red, brown Treatment Notes Wound #8 (Lower Leg) Wound Laterality: Left, Lateral, Distal Cleanser Byram Ancillary Kit - 15 Day Supply Discharge Instruction: Use supplies as instructed; Kit contains: (15) Saline Bullets; (15) 3x3 Gauze; 15 pr Gloves Soap and Water Discharge Instruction: Gently cleanse wound with antibacterial soap, rinse and pat dry prior to dressing wounds Wound Cleanser Discharge Instruction: Wash your hands with soap and water. Remove old dressing, discard into plastic bag and  place into trash. Cleanse the wound with Wound Cleanser prior to applying a clean dressing using gauze sponges, not tissues or cotton balls. Do not scrub or use excessive force. Pat dry using gauze sponges, not tissue or cotton balls. Peri-Wound Care Topical Primary Dressing Silvercel 4 1/4x 4 1/4 (in/in) Discharge Instruction: Apply Silvercel 4 1/4x 4 1/4 (in/in) as instructed Secondary Dressing Gauze Discharge Instruction: As directed: dry, Secured With Tubigrip Size D, 3x10 (in/yd) Compression Wrap Compression Stockings Add-Ons Electronic Signature(s) Signed: 07/27/2022 4:06:10 PM By: Gretta Cool, BSN, RN, CWS, Kim RN, BSN Signed: 07/31/2022 5:32:10 PM By: Massie Kluver Entered By: Massie Kluver on 07/27/2022 13:11:32 [name] ([id number]) [id number] Page  12 of 12 -------------------------------------------------------------------------------- Vitals Details Patient Name: Date of Service: SETTLE, [place] 07/27/2022 12:45 PM Medical Record Number: [id number] Patient Account Number: [id number] Date of Birth/Sex: Treating RN: May 18, 1934 (88 y.o. Charolette Forward, Kim Primary Care Keishawn Rajewski: Fulton Reek Other Clinician: Massie Kluver Referring Zaylen Susman: Treating Emily Forse/Extender: Maurilio Lovely Weeks in Treatment: 9 Vital Signs Time Taken: 12:55 Temperature (F): 98.1 Height (in): 64 Pulse (bpm): 71 Weight (lbs): 166 Respiratory Rate (breaths/min): 18 Body Mass Index (BMI): 28.5 Blood Pressure (mmHg): 160/86 Reference Range: 80 - 120 mg / dl Electronic Signature(s) Signed: 07/31/2022 5:32:10 PM By: Massie Kluver Entered By: Massie Kluver on 07/27/2022 12:58:44

## 2022-08-17 ENCOUNTER — Encounter: Payer: Medicare Other | Attending: Physician Assistant | Admitting: Physician Assistant

## 2022-08-17 DIAGNOSIS — N186 End stage renal disease: Secondary | ICD-10-CM | POA: Insufficient documentation

## 2022-08-17 DIAGNOSIS — J449 Chronic obstructive pulmonary disease, unspecified: Secondary | ICD-10-CM | POA: Diagnosis not present

## 2022-08-17 DIAGNOSIS — I5040 Unspecified combined systolic (congestive) and diastolic (congestive) heart failure: Secondary | ICD-10-CM | POA: Insufficient documentation

## 2022-08-17 DIAGNOSIS — Z853 Personal history of malignant neoplasm of breast: Secondary | ICD-10-CM | POA: Diagnosis not present

## 2022-08-17 DIAGNOSIS — L97822 Non-pressure chronic ulcer of other part of left lower leg with fat layer exposed: Secondary | ICD-10-CM | POA: Insufficient documentation

## 2022-08-17 DIAGNOSIS — Z951 Presence of aortocoronary bypass graft: Secondary | ICD-10-CM | POA: Diagnosis not present

## 2022-08-17 DIAGNOSIS — I87333 Chronic venous hypertension (idiopathic) with ulcer and inflammation of bilateral lower extremity: Secondary | ICD-10-CM | POA: Diagnosis present

## 2022-08-17 DIAGNOSIS — E1151 Type 2 diabetes mellitus with diabetic peripheral angiopathy without gangrene: Secondary | ICD-10-CM | POA: Diagnosis not present

## 2022-08-17 DIAGNOSIS — I132 Hypertensive heart and chronic kidney disease with heart failure and with stage 5 chronic kidney disease, or end stage renal disease: Secondary | ICD-10-CM | POA: Insufficient documentation

## 2022-08-17 DIAGNOSIS — E11621 Type 2 diabetes mellitus with foot ulcer: Secondary | ICD-10-CM | POA: Insufficient documentation

## 2022-08-17 DIAGNOSIS — L97812 Non-pressure chronic ulcer of other part of right lower leg with fat layer exposed: Secondary | ICD-10-CM | POA: Insufficient documentation

## 2022-08-17 DIAGNOSIS — Z992 Dependence on renal dialysis: Secondary | ICD-10-CM | POA: Diagnosis not present

## 2022-08-17 NOTE — Progress Notes (Addendum)
NILAM, QUAKENBUSH Weiss ([id number]) [id number] Page 1 of 10 Visit Report for 08/17/2022 Chief Complaint Document Details Patient Name: Date of Service: [name], [name]. 08/17/2022 12:45 PM Medical Record Number: [id number] Patient Account Number: [id number] Date of Birth/Sex: Treating RN: 1934-01-22 (88 y.o. [name] Primary Care Provider: Fulton Reek Other Clinician: Massie Kluver Referring Provider: Treating Provider/Extender: Rupert Stacks in Treatment: 12 Information Obtained from: Patient Chief Complaint Bilateral LE Ulcers Electronic Signature(s) Signed: 08/17/2022 1:27:00 PM By: Worthy Keeler PA-C Entered By: Worthy Keeler on 08/17/2022 13:27:00 -------------------------------------------------------------------------------- HPI Details Patient Name: Date of Service: [place], [name]. 08/17/2022 12:45 PM Medical Record Number: [id number] Patient Account Number: [id number] Date of Birth/Sex: Treating RN: 03-12-1934 (88 y.o. [name] Primary Care Provider: Fulton Reek Other Clinician: Massie Kluver Referring Provider: Treating Provider/Extender: Maurilio Lovely Weeks in Treatment: 12 History of Present Illness HPI Description: ADMISSION 07/14/2020. This is a medically complex 88 year old woman who is sent over urgently from her primary care doctor Dr. Doy Hutching at [facility] and we worked her in this afternoon. She is apparently developed a blister on the dorsal foot sometime over the last 2 weeks on the right which seems to have developed a hemorrhagic component to this. The history here is somewhat vague but this is happened since she left the hospital on 07/02/2020. She also has a blistered area on the left dorsal foot although this does not have a hemorrhagic component to it and it is not threatened to open where is the right side is definitely leaking fluid and could easily break down.  I do not believe she has been doing anything specific to this. With regards to her medical history I have reviewed the discharge summary from the hospital from 06/22/2020 through 07/02/2020. 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 She was given prednisone but was not felt to be a candidate for immunosuppression secondary to her age and comorbidities. She is ANA positive p-ANCA positive. She had a right IJ catheter placed on 9/24 and is being done on dialysis. She is a diabetic. She takes prednisone 20 mg twice daily. She was on aspirin and Plavix although I think these have been put on hold. Past medical history includes; pauci-immune crescentic glomerulonephritis. History of peripheral edema and type 2 diabetes left carotid artery stenosis with a history of a CVA, coronary artery disease and hypertension. She states that she tolerates dialysis reasonably poorly secondary to "butt cramps". She says that when she gets discomfort they stop her dialysis. This probably has something to do with her fluid overloaded state [name], [name] ([id number]) [id number] Page 2 of 10 I cannot see any arterial evaluation in her record in [place] link. We could not do arterial ABIs on her because of complaints of discomfort 10/20; 1 week follow-up. This is a patient who is a diabetic. She recently developed acute renal failure because of pauci-immune glomerulonephritis. Sometime during her initial hospitalization she developed a wound on her right foot tremendous swelling in both legs. She was sent over here urgently last week by her primary care physician with what looks to be hematoma still contained on the right dorsal foot. We put silver alginate on this and put her in compression to control some of the swelling. The left leg had a similar appearance with severe edema in the left dorsal foot  so we put that under compression as well. When  she arrived back for nurse follow-up on Friday there was some drainage coming out of the wound which our nurse cultured. 86 Pseudomonas Enterococcus faecalis and staph aureus although the identification of the staph aureus is not complete. I empirically put her on cefdinir 300 mg after dialysis on Tuesday Thursday and Saturday she is only had one dose. She has not been systemically unwell 10/27 1 week follow-up. With considerable help of her nursing staff I was finally able to speak to her nephrologist who manages her at dialysis. Started her on vancomycin and ceftazidime. This to cover the combination of methicillin sensitive staph aureus Enterococcus faecalis and Pseudomonas. Although the staph aureus was methicillin sensitive and the Enterococcus ampicillin sensitive vancomycin provided the best alternative here that can be given at dialysis and ceftazidime to cover the Pseudomonas. She had a traumatic wound on the top of her foot and a complicated abscess. She has significant tissue destruction from this although the wound is looking a lot better today. We are using silver alginate to the surface. X-ray of the foot did not show any deep obvious infection or osteomyelitis 12/1; since the patient was last here she was hospitalized from 11/3 through 11/10 with coag negative staphylococcal bacteremia. I do not know the the exact cause of this was determined. She was seen by vascular and they replaced her permacath on the right side of her neck. Her repeat blood cultures were negative. She is still on IV vancomycin ando Ceftazidime at dialysis. She generally feels systemically well she does not have a fever she is still receiving dialysis but reports she voids up to 8 times a day She has home health changing the dressing I believe they are using silver alginate under 3 layer compression. In addition to the large wound on her  right anterior foot that we initially saw her for she has apparently a right lateral leg wound that she suffered during a fall and 2 skin tears on the left anterior lower leg 12/8; patient comes in looking a lot better this week. One of the 2 skin tears she has on the left leg is healed the other is smaller. The area on the right lateral leg looks improved and her original wound on the right dorsal foot is a lot smaller and looks a lot better. We used Hydrofera Blue to all wound areas under 3 layer compression.. Apparently home health had some trouble with the compression wraps. 12/15 patient comes in with all of her wounds smaller including her original deep punched out wound on the right dorsal foot as well as the skin tears on the right posterior lateral calf and the left anterior lower leg. We have been using Hydrofera Blue under compression excellent results 10/12/2020 upon evaluation today patient appears to be doing decently well currently in regard to her foot ulcer. She does have a small hole still open with some tunneling that really goes proximally in the foot at around the 12:00 location. There is really not significant undermining other locations. This appears to be a small area of where there was a little fluid trapped. Nonetheless I do feel like this is still doing quite well and in general I think that she is going to benefit from possibly packing into this area with endoform in order to keep the area open while it granulates in. 11/09/2020 upon evaluation today patient appears to be doing really about the same in regard to her wound. There is a little  reopening towards the distal portion of the wound bed but again I think this is just more as a result of the dressing possibly getting stuck even to little bit of the scar tissue nonetheless it also anything looks worse although I am not sure the endoform is really doing the job for her. I think we may want to try something a little different  here I think packing with a silver alginate dressing may be optimal as compared to what we tried at this point. Is been almost a month since have seen her and we really have not seen dramatic improvement. 12/07/2020 upon evaluation today patient appears to be doing well with regard to the dorsal surface of her foot. Overall I feel like this looks to be completely healed today and this is excellent news. There is no evidence of active infection which is great news. No fevers, chills, nausea, vomiting, or diarrhea. 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 We managed to heal her out. As I remember she had severe bilateral lower extremity edema secondary to acute on chronic renal failure. She arrives in clinic this time with a wound on her distal left dorsal foot that is been there for about 6 months. Completely eschar covered she has been treating this with Neosporin. She said she is on IV vancomycin and cefepime at dialysis although we have no independent verification of this. This is due to be finished on 01/30/2022. She says it was for this foot but she does not say that she has had x-rays and MRI etc. Past medical history is extensive; she has type 2 diabetes although not currently in any treatment, end-stage renal disease on dialysis Monday Wednesday and Friday, coronary artery disease status post CABG, combined systolic and diastolic heart failure, peripheral vascular disease, cerebrovascular disease. Carotid artery stenosis, COPD, breast cancer ABI in our clinic was 0.79. I cannot find that she has had previous noninvasive arterial studies. She is only able to walk around the house does not describe claudication that I can elicit Upon inspection patient's wounds actually appear to be doing decently  well. On the left foot this is mainly dry drainage, bleeding, and Iodoflex which is stuck on the surface of the wound I was able to get that off mechanically she did not want any sharp debridement whatsoever fortunately is able to get it off mechanically without using that just using saline and gauze. Subsequently I think this is otherwise doing quite well. On the right leg she does have swelling and she had a blister that popped up this feels like it almost completely sealed down but it still weeping and leaking a little bit I think we need to manage that as well to be honest. 02-14-2022 upon evaluation today patient appears to be doing well currently in regard to the wound on her foot. She has been tolerating the dressing changes there is a little bit of film buildup here in the patient last week was very resistant to debridement because her ABIs were somewhat low I was avoiding that as well. However she did see Dr. Lazaro Arms and he apparently told her that her ABIs were good everything appeared to be fine and to be honest I think this is excellent news. I am very pleased to hear this and I do believe doing  some sharp debridement would be helpful at this point as well. 02-21-2022 upon evaluation today patient appears to be doing well with regard to her wounds. Both are showing signs of improvement this is great news. Fortunately I do not see any evidence of active infection locally or systemically at this time which is great news. No fevers, chills, nausea, vomiting, or diarrhea. 03-02-2022 upon evaluation today patient appears to be doing better in regard to her right leg which appears to be healed. In regards to her left leg this on the foot at the top still has a small opening at this time. Fortunately there does not appear to be any signs of infection. With that being said I do think we may want to switch over to collagen based dressing to see if we get this to feeling a little bit better. 03-09-2022 upon  evaluation today patient appears to be doing well currently in regard to her wound on the top of her left foot. I am very pleased with where things stand and I think we are headed in the right direction here. She is still having a lot of trouble with the compression wrap. She is doing extremely well on the right side with the Tubigrip so I think we can probably switch over to using the Tubigrip on the left side as well especially since the collagen is getting so dry I do not believe this is doing her as much good as I would like for her to to be honest. 03-23-2022 upon evaluation today patient appears to be doing well with regard to her foot ulcer. This is pretty much healed at this point. Fortunately I see no signs of infection which is great news. The biggest issue I think is good to be keeping her edema under control. The Tubigrip is not very tight but unfortunately rolls down the cuts and on the top of her leg I think she needs something to try to help keep this under better control more evenly she has done well she tells me in the past with an Ace wrap I think we can probably give that a shot. Readmission: [name], [name] ([id number]) [phone].pdf Page 3 of 10 05-23-2022 upon evaluation today patient presents for reevaluation here in the clinic she was last seen June 23, 2022. At that time we got her compression wraps with juxta lites. Unfortunately she did not wear these appropriately she tells me that they were unable to get them on. I am not really sure why that is and to be peripherally honest I think that this to some degree is just a matter of they let her legs get too swollen and out-of-control and then subsequently ended up with it getting to the point that the juxta lites will be been fit. Either way based on what I am seeing at this time I do believe that the patient has been required compression here in the office to get this under control I think the 3  layer compression wraps probably the right way to go she is not going to tolerate anything too tight and the 4-layer I think would be much too tight for her. Patient's past medical history really has not changed she does have hypertension, end-stage renal disease, and she is on renal dialysis. Unfortunately they are not able to remove so much fluid as it throws her entire electrolyte imbalance that subsequently causes extreme cramping. 05-30-2022 upon evaluation today patient appears to be doing about the same in regard to  her wounds. She needs to have some compression but she took off the 3 layer compression wrap she tells me that it was about to make a fall several times and she could not continue to deal with that. She is states that there is no way she can afford to break anything which I completely understand. With that being said I do believe that the patient should likely have some type of compression on she told me that "she is not here for me to fix her fluid problems she is here for me to fix her wounds." With that being said I explained to the patient that there is not any way to do 1 without the other because the fluid is exacerbating the wound. She did not have much to say that to be honest. 06-08-2022 upon evaluation today patient appears to be doing well with regard to her legs. I do see signs of improvement she did keep the Tubigrip on and her and her husband have been doing this which is extremely good for her at this point. I do not see any signs of active infection which is great news and overall I think she is doing well in that regard. I do believe that keeping the edema under some control even if its not perfect is better than nothing. She just did not tolerate the compression wraps at all. 06-15-2022 upon evaluation today patient appears to be doing excellent in regard to her wound on each leg. She has been tolerating the dressing changes without complication and overall seems to be  doing quite well. I do not see any signs of active infection locally or systemically at this point. 06-22-2022 upon evaluation today patient appears to be doing well currently in regard to her wound. She has been tolerating the dressing changes without complication. Fortunately there does not appear to be any evidence of active infection at this time which is great news. No fevers, chills, nausea, vomiting, or diarrhea. 06-29-2022 upon evaluation today patient appears to be doing well currently in regard to her wounds. She is actually showing signs of significant improvement which is great news and overall I am extremely pleased with where we stand currently. There does not appear to be any evidence of active infection at this time. 10-5-2023Upon evaluation today patient actually appears to be doing much better at this time in regard to some of the wounds although the one on the right lateral/distal location has opened unfortunately. Again this is something we will watch him last week I was hoping it would just reabsorb but that was definitely not what happened. Fortunately I do not see any evidence of infection locally or systemically at this time. 07-27-2022 in general patient's wounds in all locations appear to be doing significantly better which is great news and overall I am extremely pleased with where we stand today. I do not see any evidence of active infection locally or systemically at this time. 08-17-2022 upon evaluation today patient appears to be doing well currently in regard to her wounds of the left leg is healed the right leg medial and lateral both seem to be doing better although she has a proximal medial wound that is not doing nearly as well. Fortunately there does not appear to be any signs of active infection locally nor systemically at this time. Electronic Signature(s) Signed: 08/17/2022 2:12:42 PM By: Worthy Keeler PA-C Entered By: Worthy Keeler on 08/17/2022  14:12:42 -------------------------------------------------------------------------------- Physical Exam Details Patient Name: Date of  Service: Sarah Shi Weiss. 08/17/2022 12:45 PM Medical Record Number: [id number] Patient Account Number: [id number] Date of Birth/Sex: Treating RN: 08/02/1934 (88 y.o. [name] Primary Care Provider: Fulton Reek Other Clinician: Massie Kluver Referring Provider: Treating Provider/Extender: Maurilio Lovely Weeks in Treatment: 28 Constitutional Well-nourished and well-hydrated in no acute distress. Respiratory normal breathing without difficulty. Psychiatric this patient is able to make decisions and demonstrates good insight into disease process. Alert and Oriented x 3. pleasant and cooperative. Notes Patient is very upset about using the Tubigrip she does not want anything wrapped even Tubigrip she tells me "is not working". For that reason she wants to quit MADLINE, OESTERLING Weiss ([id number]) [id number] Page 4 of 10 using those altogether. I am very concerned that her swelling is any get worse if she does this but nonetheless that seems to be the direction that she is going to take. At this point what that means is that we will have to just use gauze and tape to secure in place after applying the silver cell currently. Electronic Signature(s) Signed: 08/17/2022 3:01:50 PM By: Worthy Keeler PA-C Entered By: Worthy Keeler on 08/17/2022 15:01:50 -------------------------------------------------------------------------------- Physician Orders Details Patient Name: Date of Service: Sarah Shi Weiss. 08/17/2022 12:45 PM Medical Record Number: [id number] Patient Account Number: [id number] Date of Birth/Sex: Treating RN: 1934-08-07 (88 y.o. Charolette Forward, Kim Primary Care Provider: Fulton Reek Other Clinician: Massie Kluver Referring Provider: Treating Provider/Extender: Rupert Stacks in Treatment: 12 Verbal / Phone Orders: No Diagnosis Coding ICD-10 Coding Code Description [phone] Chronic venous hypertension (idiopathic) with ulcer and inflammation of bilateral lower extremity L97.812 Non-pressure chronic ulcer of other part of right lower leg with fat layer exposed L97.822 Non-pressure chronic ulcer of other part of left lower leg with fat layer exposed I10 Essential (primary) hypertension Z99.2 Dependence on renal dialysis I12.0 Hypertensive chronic kidney disease with stage 5 chronic kidney disease or end stage renal disease Follow-up Appointments Return Appointment in 3 weeks. Bathing/ Shower/ Hygiene May shower with wound dressing protected with water repellent cover or cast protector. Anesthetic (Use 'Patient Medications' Section for Anesthetic Order Entry) Lidocaine applied to wound bed Edema Control - Lymphedema / Segmental Compressive Device / Other Patient to wear own compression stockings. Remove compression stockings every night before going to bed and put on every morning when getting up. - apply lotion to legs at night Elevate, Exercise Daily and A void Standing for Long Periods of Time. Elevate legs to the level of the heart and pump ankles as often as possible Elevate leg(s) parallel to the floor when sitting. Wound Treatment Wound #10 - Lower Leg Wound Laterality: Right, Lateral Cleanser: Byram Ancillary Kit - 15 Day Supply (Generic) 3 x Per Week/30 Days Discharge Instructions: Use supplies as instructed; Kit contains: (15) Saline Bullets; (15) 3x3 Gauze; 15 pr Gloves Cleanser: Soap and Water 3 x Per Week/30 Days Discharge Instructions: Gently cleanse wound with antibacterial soap, rinse and pat dry prior to dressing wounds Cleanser: Wound Cleanser 3 x Per Week/30 Days Discharge Instructions: Wash your hands with soap and water. Remove old dressing, discard into plastic bag and place into trash. Cleanse the wound with Wound Cleanser  prior to applying a clean dressing using gauze sponges, not tissues or cotton balls. Do not scrub or use excessive force. Pat dry using gauze sponges, not tissue or cotton balls. Prim Dressing: Silvercel 4 1/4x 4 1/4 (in/in) (Generic) 3 x Per Week/30 Days ary Discharge Instructions: Apply  Silvercel 4 1/4x 4 1/4 (in/in) as instructed Secondary Dressing: Gauze (Generic) 3 x Per Week/30 Days Discharge Instructions: As directed: dry, [name], [name] ([id number]) [phone].pdf Page 5 of 10 Secured With: Medipore Weiss - 631M Medipore H Soft Cloth Surgical Weiss ape ape, 2x2 (in/yd) 3 x Per Week/30 Days Wound #11 - Lower Leg Wound Laterality: Right, Medial, Proximal Cleanser: Byram Ancillary Kit - 15 Day Supply (Generic) 3 x Per Week/30 Days Discharge Instructions: Use supplies as instructed; Kit contains: (15) Saline Bullets; (15) 3x3 Gauze; 15 pr Gloves Cleanser: Soap and Water 3 x Per Week/30 Days Discharge Instructions: Gently cleanse wound with antibacterial soap, rinse and pat dry prior to dressing wounds Cleanser: Wound Cleanser 3 x Per Week/30 Days Discharge Instructions: Wash your hands with soap and water. Remove old dressing, discard into plastic bag and place into trash. Cleanse the wound with Wound Cleanser prior to applying a clean dressing using gauze sponges, not tissues or cotton balls. Do not scrub or use excessive force. Pat dry using gauze sponges, not tissue or cotton balls. Prim Dressing: Silvercel 4 1/4x 4 1/4 (in/in) (Generic) 3 x Per Week/30 Days ary Discharge Instructions: Apply Silvercel 4 1/4x 4 1/4 (in/in) as instructed Secondary Dressing: Gauze (Generic) 3 x Per Week/30 Days Discharge Instructions: As directed: dry, Secured With: Medipore Weiss - 631M Medipore H Soft Cloth Surgical Weiss ape ape, 2x2 (in/yd) 3 x Per Week/30 Days Wound #7 - Lower Leg Wound Laterality: Right, Medial, Distal Cleanser: Byram Ancillary Kit - 15 Day Supply (Generic) 3 x Per  Week/30 Days Discharge Instructions: Use supplies as instructed; Kit contains: (15) Saline Bullets; (15) 3x3 Gauze; 15 pr Gloves Cleanser: Soap and Water 3 x Per Week/30 Days Discharge Instructions: Gently cleanse wound with antibacterial soap, rinse and pat dry prior to dressing wounds Cleanser: Wound Cleanser 3 x Per Week/30 Days Discharge Instructions: Wash your hands with soap and water. Remove old dressing, discard into plastic bag and place into trash. Cleanse the wound with Wound Cleanser prior to applying a clean dressing using gauze sponges, not tissues or cotton balls. Do not scrub or use excessive force. Pat dry using gauze sponges, not tissue or cotton balls. Prim Dressing: Silvercel 4 1/4x 4 1/4 (in/in) (Generic) 3 x Per Week/30 Days ary Discharge Instructions: Apply Silvercel 4 1/4x 4 1/4 (in/in) as instructed Secondary Dressing: Gauze (Generic) 3 x Per Week/30 Days Discharge Instructions: As directed: dry, Secured With: Medipore Weiss - 631M Medipore H Soft Cloth Surgical Weiss ape ape, 2x2 (in/yd) 3 x Per Week/30 Days Electronic Signature(s) Signed: 08/17/2022 5:06:55 PM By: Massie Kluver Signed: 08/18/2022 2:32:55 PM By: Worthy Keeler PA-C Entered By: Massie Kluver on 08/17/2022 13:38:37 -------------------------------------------------------------------------------- Problem List Details Patient Name: Date of Service: Sarah Shi Weiss. 08/17/2022 12:45 PM Medical Record Number: [id number] Patient Account Number: [id number] Date of Birth/Sex: Treating RN: 1933-12-23 (88 y.o. [name] Primary Care Provider: Fulton Reek Other Clinician: Massie Kluver Referring Provider: Treating Provider/Extender: Rupert Stacks in Treatment: [address] [place], Tessie Fass Weiss ([id number]) [id number] Page 6 of 10 ICD-10 Encounter Code Description Active Date MDM Diagnosis I87.333 Chronic venous hypertension (idiopathic) with  ulcer and inflammation of 05/23/2022 No Yes bilateral lower extremity L97.812 Non-pressure chronic ulcer of other part of right lower leg with fat layer 05/23/2022 No Yes exposed L97.822 Non-pressure chronic ulcer of other part of left lower leg with fat layer exposed8/22/2023 No Yes I10 Essential (primary) hypertension 05/23/2022 No Yes Z99.2 Dependence on renal  dialysis 05/23/2022 No Yes I12.0 Hypertensive chronic kidney disease with stage 5 chronic kidney disease or 05/23/2022 No Yes end stage renal disease Inactive Problems Resolved Problems Electronic Signature(s) Signed: 08/17/2022 1:26:51 PM By: Worthy Keeler PA-C Entered By: Worthy Keeler on 08/17/2022 13:26:51 -------------------------------------------------------------------------------- Progress Note Details Patient Name: Date of Service: Sarah Shi Weiss. 08/17/2022 12:45 PM Medical Record Number: [id number] Patient Account Number: [id number] Date of Birth/Sex: Treating RN: 1934-01-05 (88 y.o. [name] Primary Care Provider: Fulton Reek Other Clinician: Massie Kluver Referring Provider: Treating Provider/Extender: Rupert Stacks in Treatment: 12 Subjective Chief Complaint Information obtained from Patient Bilateral LE Ulcers History of Present Illness (HPI) ADMISSION 07/14/2020. This is a medically complex 88 year old woman who is sent over urgently from her primary care doctor Dr. Doy Hutching at [facility] and we worked her in this afternoon. She is apparently developed a blister on the dorsal foot sometime over the last 2 weeks on the right which seems to have developed a hemorrhagic component to this. The history here is somewhat vague but this is happened since she left the hospital on 07/02/2020. She also has a blistered area on the left dorsal foot although this does not have a hemorrhagic component to it and it is not threatened to open where is the right side is definitely leaking  fluid and could easily break down. I do not believe she has been doing anything specific to this. GINNI, EICHLER Weiss ([id number]) [id number] Page 7 of 10 With regards to her medical history I have reviewed the discharge summary from the hospital from 06/22/2020 through 07/02/2020. 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 She was given prednisone but was not felt to be a candidate for immunosuppression secondary to her age and comorbidities. She is ANA positive p-ANCA positive. She had a right IJ catheter placed on 9/24 and is being done on dialysis. She is a diabetic. She takes prednisone 20 mg twice daily. She was on aspirin and Plavix although I think these have been put on hold. Past medical history includes; pauci-immune crescentic glomerulonephritis. History of peripheral edema and type 2 diabetes left carotid artery stenosis with a history of a CVA, coronary artery disease and hypertension. She states that she tolerates dialysis reasonably poorly secondary to "butt cramps". She says that when she gets discomfort they stop her dialysis. This probably has something to do with her fluid overloaded state I cannot see any arterial evaluation in her record in [facility]. We could not do arterial ABIs on her because of complaints of discomfort 10/20; 1 week follow-up. This is a patient who is a diabetic. She recently developed acute renal failure because of pauci-immune glomerulonephritis. Sometime during her initial hospitalization she developed a wound on her right foot tremendous swelling in both legs. She was sent over here urgently last week by her primary care physician with what looks to be hematoma still contained on the right dorsal foot. We put silver alginate on this and put her in compression to control some of the swelling. The left leg had a similar appearance with  severe edema in the left dorsal foot so we put that under compression as well. When she arrived back for nurse follow-up on Friday there was some drainage coming out of the wound which our nurse cultured. 86 Pseudomonas Enterococcus faecalis and staph aureus although the identification of the staph aureus is not  complete. I empirically put her on cefdinir 300 mg after dialysis on Tuesday Thursday and Saturday she is only had one dose. She has not been systemically unwell 10/27 1 week follow-up. With considerable help of her nursing staff I was finally able to speak to her nephrologist who manages her at dialysis. Started her on vancomycin and ceftazidime. This to cover the combination of methicillin sensitive staph aureus Enterococcus faecalis and Pseudomonas. Although the staph aureus was methicillin sensitive and the Enterococcus ampicillin sensitive vancomycin provided the best alternative here that can be given at dialysis and ceftazidime to cover the Pseudomonas. She had a traumatic wound on the top of her foot and a complicated abscess. She has significant tissue destruction from this although the wound is looking a lot better today. We are using silver alginate to the surface. X-ray of the foot did not show any deep obvious infection or osteomyelitis 12/1; since the patient was last here she was hospitalized from 11/3 through 11/10 with coag negative staphylococcal bacteremia. I do not know the the exact cause of this was determined. She was seen by vascular and they replaced her permacath on the right side of her neck. Her repeat blood cultures were negative. She is still on IV vancomycin ando Ceftazidime at dialysis. She generally feels systemically well she does not have a fever she is still receiving dialysis but reports she voids up to 8 times a day She has home health changing the dressing I believe they are using silver alginate under 3 layer compression. In  addition to the large wound on her right anterior foot that we initially saw her for she has apparently a right lateral leg wound that she suffered during a fall and 2 skin tears on the left anterior lower leg 12/8; patient comes in looking a lot better this week. One of the 2 skin tears she has on the left leg is healed the other is smaller. The area on the right lateral leg looks improved and her original wound on the right dorsal foot is a lot smaller and looks a lot better. We used Hydrofera Blue to all wound areas under 3 layer compression.. Apparently home health had some trouble with the compression wraps. 12/15 patient comes in with all of her wounds smaller including her original deep punched out wound on the right dorsal foot as well as the skin tears on the right posterior lateral calf and the left anterior lower leg. We have been using Hydrofera Blue under compression excellent results 10/12/2020 upon evaluation today patient appears to be doing decently well currently in regard to her foot ulcer. She does have a small hole still open with some tunneling that really goes proximally in the foot at around the 12:00 location. There is really not significant undermining other locations. This appears to be a small area of where there was a little fluid trapped. Nonetheless I do feel like this is still doing quite well and in general I think that she is going to benefit from possibly packing into this area with endoform in order to keep the area open while it granulates in. 11/09/2020 upon evaluation today patient appears to be doing really about the same in regard to her wound. There is a little reopening towards the distal portion of the wound bed but again I think this is just more as a result of the dressing possibly getting stuck even to little bit of the scar tissue nonetheless it also anything looks worse although  I am not sure the endoform is really doing the job for her. I think we may want  to try something a little different here I think packing with a silver alginate dressing may be optimal as compared to what we tried at this point. Is been almost a month since have seen her and we really have not seen dramatic improvement. 12/07/2020 upon evaluation today patient appears to be doing well with regard to the dorsal surface of her foot. Overall I feel like this looks to be completely healed today and this is excellent news. There is no evidence of active infection which is great news. No fevers, chills, nausea, vomiting, or diarrhea. 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 We managed to heal her out. As I remember she had severe bilateral lower extremity edema secondary to acute on chronic renal failure. She arrives in clinic this time with a wound on her distal left dorsal foot that is been there for about 6 months. Completely eschar covered she has been treating this with Neosporin. She said she is on IV vancomycin and cefepime at dialysis although we have no independent verification of this. This is due to be finished on 01/30/2022. She says it was for this foot but she does not say that she has had x-rays and MRI etc. Past medical history is extensive; she has type 2 diabetes although not currently in any treatment, end-stage renal disease on dialysis Monday Wednesday and Friday, coronary artery disease status post CABG, combined systolic and diastolic heart failure, peripheral vascular disease, cerebrovascular disease. Carotid artery stenosis, COPD, breast cancer ABI in our clinic was 0.79. I cannot find that she has had previous noninvasive arterial studies. She is only able to walk around the house does not describe claudication that I can elicit Upon inspection patient's wounds  actually appear to be doing decently well. On the left foot this is mainly dry drainage, bleeding, and Iodoflex which is stuck on the surface of the wound I was able to get that off mechanically she did not want any sharp debridement whatsoever fortunately is able to get it off mechanically without using that just using saline and gauze. Subsequently I think this is otherwise doing quite well. On the right leg she does have swelling and she had a blister that popped up this feels like it almost completely sealed down but it still weeping and leaking a little bit I think we need to manage that as well to be honest. 02-14-2022 upon evaluation today patient appears to be doing well currently in regard to the wound on her foot. She has been tolerating the dressing changes there is a little bit of film buildup here in the patient last week was very resistant to debridement because her ABIs were somewhat low I was avoiding that as well. However she did see Dr. Lazaro Arms and he apparently told her that her ABIs were good everything appeared to be fine and to be honest I think this is excellent news. I am very pleased to hear this and I do believe doing some sharp debridement would be helpful at this point as well. 02-21-2022 upon evaluation today patient appears to be doing well with regard to her wounds. Both are showing signs of improvement this is great news. Fortunately I do not see  any evidence of active infection locally or systemically at this time which is great news. No fevers, chills, nausea, vomiting, or diarrhea. 03-02-2022 upon evaluation today patient appears to be doing better in regard to her right leg which appears to be healed. In regards to her left leg this on the foot at the top still has a small opening at this time. Fortunately there does not appear to be any signs of infection. With that being said I do think we may want to switch over to collagen based dressing to see if we get this to feeling a  little bit better. 03-09-2022 upon evaluation today patient appears to be doing well currently in regard to her wound on the top of her left foot. I am very pleased with where things [name], [name] ([id number]) [id number] Page 8 of 10 stand and I think we are headed in the right direction here. She is still having a lot of trouble with the compression wrap. She is doing extremely well on the right side with the Tubigrip so I think we can probably switch over to using the Tubigrip on the left side as well especially since the collagen is getting so dry I do not believe this is doing her as much good as I would like for her to to be honest. 03-23-2022 upon evaluation today patient appears to be doing well with regard to her foot ulcer. This is pretty much healed at this point. Fortunately I see no signs of infection which is great news. The biggest issue I think is good to be keeping her edema under control. The Tubigrip is not very tight but unfortunately rolls down the cuts and on the top of her leg I think she needs something to try to help keep this under better control more evenly she has done well she tells me in the past with an Ace wrap I think we can probably give that a shot. Readmission: 05-23-2022 upon evaluation today patient presents for reevaluation here in the clinic she was last seen June 23, 2022. At that time we got her compression wraps with juxta lites. Unfortunately she did not wear these appropriately she tells me that they were unable to get them on. I am not really sure why that is and to be peripherally honest I think that this to some degree is just a matter of they let her legs get too swollen and out-of-control and then subsequently ended up with it getting to the point that the juxta lites will be been fit. Either way based on what I am seeing at this time I do believe that the patient has been required compression here in the office to get  this under control I think the 3 layer compression wraps probably the right way to go she is not going to tolerate anything too tight and the 4-layer I think would be much too tight for her. Patient's past medical history really has not changed she does have hypertension, end-stage renal disease, and she is on renal dialysis. Unfortunately they are not able to remove so much fluid as it throws her entire electrolyte imbalance that subsequently causes extreme cramping. 05-30-2022 upon evaluation today patient appears to be doing about the same in regard to her wounds. She needs to have some compression but she took off the 3 layer compression wrap she tells me that it was about to make a fall several times and she could not continue to deal with that. She is  states that there is no way she can afford to break anything which I completely understand. With that being said I do believe that the patient should likely have some type of compression on she told me that "she is not here for me to fix her fluid problems she is here for me to fix her wounds." With that being said I explained to the patient that there is not any way to do 1 without the other because the fluid is exacerbating the wound. She did not have much to say that to be honest. 06-08-2022 upon evaluation today patient appears to be doing well with regard to her legs. I do see signs of improvement she did keep the Tubigrip on and her and her husband have been doing this which is extremely good for her at this point. I do not see any signs of active infection which is great news and overall I think she is doing well in that regard. I do believe that keeping the edema under some control even if its not perfect is better than nothing. She just did not tolerate the compression wraps at all. 06-15-2022 upon evaluation today patient appears to be doing excellent in regard to her wound on each leg. She has been tolerating the dressing changes  without complication and overall seems to be doing quite well. I do not see any signs of active infection locally or systemically at this point. 06-22-2022 upon evaluation today patient appears to be doing well currently in regard to her wound. She has been tolerating the dressing changes without complication. Fortunately there does not appear to be any evidence of active infection at this time which is great news. No fevers, chills, nausea, vomiting, or diarrhea. 06-29-2022 upon evaluation today patient appears to be doing well currently in regard to her wounds. She is actually showing signs of significant improvement which is great news and overall I am extremely pleased with where we stand currently. There does not appear to be any evidence of active infection at this time. 10-5-2023Upon evaluation today patient actually appears to be doing much better at this time in regard to some of the wounds although the one on the right lateral/distal location has opened unfortunately. Again this is something we will watch him last week I was hoping it would just reabsorb but that was definitely not what happened. Fortunately I do not see any evidence of infection locally or systemically at this time. 07-27-2022 in general patient's wounds in all locations appear to be doing significantly better which is great news and overall I am extremely pleased with where we stand today. I do not see any evidence of active infection locally or systemically at this time. 08-17-2022 upon evaluation today patient appears to be doing well currently in regard to her wounds of the left leg is healed the right leg medial and lateral both seem to be doing better although she has a proximal medial wound that is not doing nearly as well. Fortunately there does not appear to be any signs of active infection locally nor systemically at this time. Objective Constitutional Well-nourished and well-hydrated in no acute  distress. Vitals Time Taken: 12:55 PM, Height: 64 in, Weight: 166 lbs, BMI: 28.5, Temperature: 97.9 F, Pulse: 84 bpm, Respiratory Rate: 18 breaths/min, Blood Pressure: 140/69 mmHg. Respiratory normal breathing without difficulty. Psychiatric this patient is able to make decisions and demonstrates good insight into disease process. Alert and Oriented x 3. pleasant and cooperative. General Notes: Patient is  very upset about using the Tubigrip she does not want anything wrapped even Tubigrip she tells me "is not working". For that reason she wants to quit using those altogether. I am very concerned that her swelling is any get worse if she does this but nonetheless that seems to be the direction that she is going to take. At this point what that means is that we will have to just use gauze and tape to secure in place after applying the silver cell currently. Integumentary (Hair, Skin) Wound #10 status is Open. Original cause of wound was Gradually Appeared. The date acquired was: 07/05/2022. The wound has been in treatment 6 weeks. The wound is located on the Right,Lateral Lower Leg. The wound measures 0.7cm length x 1.4cm width x 0.2cm depth; 0.77cm^2 area and 0.154cm^3 volume. There is Fat Layer (Subcutaneous Tissue) exposed. There is a medium amount of serous drainage noted. There is no granulation within the wound bed. There is a large (67-100%) amount of necrotic tissue within the wound bed including Adherent Slough. Wound #11 status is Open. Original cause of wound was Pressure Injury. The date acquired was: 08/17/2022. The wound is located on the Right,Proximal,Medial Lower Leg. The wound measures 2.3cm length x 2.3cm width x 0.1cm depth; 4.155cm^2 area and 0.415cm^3 volume. There is Fat Layer (Subcutaneous Tissue) exposed. There is no tunneling or undermining noted. There is a medium amount of serosanguineous drainage noted. The wound margin is flat and intact. There [name], [name]  ([id number]) [id number] Page 9 of 10 is no granulation within the wound bed. There is a small (1-33%) amount of necrotic tissue within the wound bed including Adherent Slough. Wound #7 status is Open. Original cause of wound was Trauma. The date acquired was: 04/19/2022. The wound has been in treatment 12 weeks. The wound is located on the Right,Distal,Medial Lower Leg. The wound measures 0.9cm length x 2cm width x 0.2cm depth; 1.414cm^2 area and 0.283cm^3 volume. There is a medium amount of serosanguineous drainage noted. Wound #8 status is Healed - Epithelialized. Original cause of wound was Trauma. The date acquired was: 04/24/2022. The wound has been in treatment 12 weeks. The wound is located on the Left,Distal,Lateral Lower Leg. The wound measures 0cm length x 0cm width x 0cm depth; 0cm^2 area and 0cm^3 volume. There is a none present amount of drainage noted. 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 Bathing/ Shower/ Hygiene: May shower with wound dressing protected with water repellent cover or cast protector. Anesthetic (Use 'Patient Medications' Section for Anesthetic Order Entry): Lidocaine applied to wound bed Edema Control - Lymphedema / Segmental Compressive Device / Other: Patient to wear own compression stockings. Remove compression stockings every night before going to bed and put on every morning when getting up. - apply lotion to legs at night Elevate, Exercise Daily and Avoid  Standing for Long Periods of Time. Elevate legs to the level of the heart and pump ankles as often as possible Elevate leg(s) parallel to the floor when sitting. WOUND #10: - Lower Leg Wound Laterality: Right, Lateral Cleanser: Byram Ancillary Kit - 15 Day Supply (Generic)  3 x Per Week/30 Days Discharge Instructions: Use supplies as instructed; Kit contains: (15) Saline Bullets; (15) 3x3 Gauze; 15 pr Gloves Cleanser: Soap and Water 3 x Per Week/30 Days Discharge Instructions: Gently cleanse wound with antibacterial soap, rinse and pat dry prior to dressing wounds Cleanser: Wound Cleanser 3 x Per Week/30 Days Discharge Instructions: Wash your hands with soap and water. Remove old dressing, discard into plastic bag and place into trash. Cleanse the wound with Wound Cleanser prior to applying a clean dressing using gauze sponges, not tissues or cotton balls. Do not scrub or use excessive force. Pat dry using gauze sponges, not tissue or cotton balls. Prim Dressing: Silvercel 4 1/4x 4 1/4 (in/in) (Generic) 3 x Per Week/30 Days ary Discharge Instructions: Apply Silvercel 4 1/4x 4 1/4 (in/in) as instructed Secondary Dressing: Gauze (Generic) 3 x Per Week/30 Days Discharge Instructions: As directed: dry, Secured With: Medipore Weiss - 55M Medipore H Soft Cloth Surgical Weiss ape ape, 2x2 (in/yd) 3 x Per Week/30 Days WOUND #11: - Lower Leg Wound Laterality: Right, Medial, Proximal Cleanser: Byram Ancillary Kit - 15 Day Supply (Generic) 3 x Per Week/30 Days Discharge Instructions: Use supplies as instructed; Kit contains: (15) Saline Bullets; (15) 3x3 Gauze; 15 pr Gloves Cleanser: Soap and Water 3 x Per Week/30 Days Discharge Instructions: Gently cleanse wound with antibacterial soap, rinse and pat dry prior to dressing wounds Cleanser: Wound Cleanser 3 x Per Week/30 Days Discharge Instructions: Wash your hands with soap and water. Remove old dressing, discard into plastic bag and place into trash. Cleanse  the wound with Wound Cleanser prior to applying a clean dressing using gauze sponges, not tissues or cotton balls. Do not scrub or use excessive force. Pat dry using gauze sponges, not tissue or cotton balls. Prim Dressing: Silvercel 4 1/4x 4 1/4 (in/in) (Generic) 3 x Per Week/30 Days ary Discharge Instructions: Apply Silvercel 4 1/4x 4 1/4 (in/in) as instructed Secondary Dressing: Gauze (Generic) 3 x Per Week/30 Days Discharge Instructions: As directed: dry, Secured With: Medipore Weiss - 55M Medipore H Soft Cloth Surgical Weiss ape ape, 2x2 (in/yd) 3 x Per Week/30 Days WOUND #7: - Lower Leg Wound Laterality: Right, Medial, Distal Cleanser: [place] Ancillary Kit - 15 Day Supply (Generic) 3 x Per Week/30 Days Discharge Instructions: Use supplies as instructed; Kit contains: (15) Saline Bullets; (15) 3x3 Gauze; 15 pr Gloves Cleanser: Soap and Water 3 x Per Week/30 Days Discharge Instructions: Gently cleanse wound with antibacterial soap, rinse and pat dry prior to dressing wounds Cleanser: Wound Cleanser 3 x Per Week/30 Days Discharge Instructions: Wash your hands with soap and water. Remove old dressing, discard into plastic bag and place into trash. Cleanse the wound with Wound Cleanser prior to applying a clean dressing using gauze sponges, not tissues or cotton balls. Do not scrub or use excessive force. Pat dry using gauze sponges, not tissue or cotton balls. Prim Dressing: Silvercel 4 1/4x 4 1/4 (in/in) (Generic) 3 x Per Week/30 Days ary Discharge Instructions: Apply Silvercel 4 1/4x 4 1/4 (in/in) as instructed Secondary Dressing: Gauze (Generic) 3 x Per Week/30 Days Discharge Instructions: As directed: dry, Secured With: Medipore Weiss - 55M Medipore H Soft Cloth Surgical Weiss ape ape, 2x2 (in/yd) 3 x Per Week/30 Days [name], [name] ([id number]) [id number] Page 10 of 10 1. I Minna recommend currently that we have the patient continue to monitor for any signs of worsening  or infection. Obviously if anything changes she knows she can contact the office and  let me know. 2. I am going to continue with the silver cell although she will use just gauze and tape to secure in place and she is not can use any of the Tubigrip at this point. We will monitor the size of her legs and see if anything increases in size at the next visit I am hopeful that she will get worse but I am a little concerned as well nonetheless she is adamant about not using Tubigrip any longer. We will see patient back for reevaluation in 1 week here in the clinic. If anything worsens or changes patient will contact our office for additional recommendations. Electronic Signature(s) Signed: 08/17/2022 3:02:26 PM By: Worthy Keeler PA-C Entered By: Worthy Keeler on 08/17/2022 15:02:26 -------------------------------------------------------------------------------- SuperBill Details Patient Name: Date of Service: Sarah Shi Weiss. 08/17/2022 Medical Record Number: [id number] Patient Account Number: [id number] Date of Birth/Sex: Treating RN: 12/26/1933 (88 y.o. 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 Facility Procedures : CPT4 Code: [id number] Description: [place] VISIT-LEV 4 EST PT Modifier: Quantity: 1 Physician Procedures : CPT4  Code Description Modifier [id number] - WC PHYS LEVEL 3 - EST PT ICD-10 Diagnosis Description I87.333 Chronic venous hypertension (idiopathic) with ulcer and inflammation of bilateral lower extremity L97.812 Non-pressure chronic ulcer of other  part of right lower leg with fat layer exposed L97.822 Non-pressure chronic ulcer of other part of left lower leg with fat layer exposed I10 Essential (primary) hypertension Quantity: 1 Electronic Signature(s) Signed: 08/17/2022 3:02:38 PM By: Worthy Keeler PA-C Entered By: Worthy Keeler on 08/17/2022 15:02:37

## 2022-08-18 NOTE — Progress Notes (Signed)
[name], [name] ([id number]) [id number] Page 1 of 14 Visit Report for 08/17/2022 Arrival Information Details Patient Name: Date of Service: [place], [name] 08/17/2022 12:45 PM Medical Record Number: [id number] Patient Account Number: [id number] Date of Birth/Sex: Treating RN: 06-05-1934 (88 y.o. [name] Primary Care [name]: [name] Other Clinician: Massie Weiss Referring [name]: Treating [name]/Extender: [name] in Treatment: 12 Visit Information History Since Last Visit All ordered tests and consults were completed: No Patient Arrived: Wheel Chair Added or deleted any medications: No Arrival Time: 12:51 Any new allergies or adverse reactions: No Transfer Assistance: EasyPivot Patient Lift Had a fall or experienced change in No Patient Requires Transmission-Based Precautions: No activities of daily living that may affect Patient Has Alerts: Yes risk of falls: Patient Alerts: Patient on Blood Thinner Signs or symptoms of abuse/neglect since last visito No DIABETIC Hospitalized since last visit: No ABI LEFT .79 02/14/22 ABI RIGHT 1.02 02/14/22 Implantable device outside of the clinic excluding No cellular tissue based products placed in the center since last visit: Pain Present Now: Yes Electronic Signature(s) Signed: 08/17/2022 5:06:55 PM By: [name] Entered By: [name] on 08/17/2022 12:52:33 -------------------------------------------------------------------------------- Clinic Level of Care Assessment Details Patient Name: Date of Service: [name], [name] 08/17/2022 12:45 PM Medical Record Number: [id number] Patient Account Number: [id number] Date of Birth/Sex: Treating RN: [birth year] (88 y.o. [name] Primary Care Baylin Gamblin: [name] Other Clinician: Massie Weiss Referring [name]: Treating [name]/Extender: [name] in Treatment:  12 Clinic Level of Care Assessment Items TOOL 4 Quantity Score _0  - 0 Use when only an EandM is performed on FOLLOW-UP visit ASSESSMENTS - Nursing Assessment / Reassessment X- 1 10 Reassessment of Co-morbidities (includes updates in patient status) X- 1 5 Reassessment of Adherence to Treatment Plan [name], [name] ([id number]) [phone].pdf Page 2 of 14 ASSESSMENTS - Wound and Skin A ssessment / Reassessment _1  - Simple Wound Assessment / Reassessment - one wound 0 X- 3 5 Complex Wound Assessment / Reassessment - multiple wounds _2  - 0 Dermatologic / Skin Assessment (not related to wound area) ASSESSMENTS - Focused Assessment X- 2 5 Circumferential Edema Measurements - multi extremities _3  - 0 Nutritional Assessment / Counseling / Intervention _4  - 0 Lower Extremity Assessment (monofilament, tuning fork, pulses) _5  - 0 Peripheral Arterial Disease Assessment (using hand held doppler) ASSESSMENTS - Ostomy and/or Continence Assessment and Care _6  - 0 Incontinence Assessment and Management _7  - 0 Ostomy Care Assessment and Management (repouching, etc.) PROCESS - Coordination of Care X - Simple Patient / Family Education for ongoing care 1 15 _8  - 0 Complex (extensive) Patient / Family Education for ongoing care _9  - 0 Staff obtains Programmer, systems, Records, Weiss Results / Process Orders est _10  - 0 Staff telephones HHA, Nursing Homes / Clarify orders / etc _11  - 0 Routine Transfer to another Facility (non-emergent condition) _12  - 0 Routine Hospital Admission (non-emergent condition) _13  - 0 New Admissions / Biomedical engineer / Ordering NPWT Apligraf, etc. , _14  - 0 Emergency Hospital Admission (emergent condition) X- 1 10 Simple Discharge Coordination _15  - 0 Complex (extensive) Discharge Coordination PROCESS - Special Needs _16  - 0 Pediatric / Minor Patient Management _17  - 0 Isolation Patient Management _18  - 0 Hearing / Language / Visual  special needs _19  - 0 Assessment of Community assistance (transportation, D/C planning, etc.) _20  - 0 Additional assistance / Altered mentation _21  - 0 Support Surface(s) Assessment (bed, cushion, seat, etc.) INTERVENTIONS - Wound  Cleansing / Measurement _0  - 0 Simple Wound Cleansing - one wound X- 3 5 Complex Wound Cleansing - multiple wounds X- 1 5 Wound Imaging (photographs - any number of wounds) _1  - 0 Wound Tracing (instead of photographs) _2  - 0 Simple Wound Measurement - one wound X- 3 5 Complex Wound Measurement - multiple wounds INTERVENTIONS - Wound Dressings _3  - 0 Small Wound Dressing one or multiple wounds X- 3 15 Medium Wound Dressing one or multiple wounds _4  - 0 Large Wound Dressing one or multiple wounds <HUTMLYYTKPTWSFKC>_1<\/EXNTZGYFVCBSWHQP>_5  - 0 Application of Medications - topical <FFMBWGYKZLDJTTSV>_7<\/BLTJQZESPQZRAQTM>_2  - 0 Application of Medications - injection INTERVENTIONS - Miscellaneous _7  - 0 External ear exam [name], [name] ([id number]) [id number] Page 3 of 14 _8  - 0 Specimen Collection (cultures, biopsies, blood, body fluids, etc.) _9  - 0 Specimen(s) / Culture(s) sent or taken to Lab for analysis _10  - 0 Patient Transfer (multiple staff / Harrel Lemon Lift / Similar devices) _11  - 0 Simple Staple / Suture removal (25 or less) _12  - 0 Complex Staple / Suture removal (26 or more) _13  - 0 Hypo / Hyperglycemic Management (close monitor of Blood Glucose) _14  - 0 Ankle / Brachial Index (ABI) - do not check if billed separately X- 1 5 Vital Signs Has the patient been seen at the hospital within the last three years: Yes Total Score: 150 Level Of Care: New/Established - Level 4 Electronic Signature(s) Signed: 08/17/2022 5:06:55 PM By: [name] Entered By: [name] on 08/17/2022 13:35:24 -------------------------------------------------------------------------------- Encounter Discharge Information Details Patient Name: Date of Service: Sarah Shi Weiss. 08/17/2022 12:45  PM Medical Record Number: [id number] Patient Account Number: [id number] Date of Birth/Sex: Treating RN: 13-May-1934 (88 y.o. [name] Primary Care Amarachi Kotz: [name] Other Clinician: Massie Weiss Referring Infant Zink: Treating Elena Cothern/Extender: [name] in Treatment: 12 Encounter Discharge Information Items Discharge Condition: Stable Ambulatory Status: Wheelchair Discharge Destination: Home Transportation: Private Auto Accompanied By: husband Schedule Follow-up Appointment: Yes Clinical Summary of Care: Electronic Signature(s) Signed: 08/17/2022 5:06:55 PM By: [name] Entered By: [name] on 08/17/2022 13:52:36 -------------------------------------------------------------------------------- Lower Extremity Assessment Details Patient Name: Date of Service: [name], [name]. 08/17/2022 12:45 PM [name] ([id number]) [id number] Page 4 of 14 Medical Record Number: [id number] Patient Account Number: [id number] Date of Birth/Sex: Treating RN: Feb 09, 1934 (88 y.o. [name] Primary Care Agustine Rossitto: [name] Other Clinician: Massie Weiss Referring Semaja Lymon: Treating Zaki Gertsch/Extender: Maurilio Lovely Weeks in Treatment: 12 Edema Assessment Assessed: Shirlyn Goltz: Yes] Patrice Paradise: Yes] Edema: [Left: Yes] [Right: Yes] Calf Left: Right: Point of Measurement: 32 cm From Medial Instep 39 cm 40 cm Ankle Left: Right: Point of Measurement: 10 cm From Medial Instep 29.7 cm 29.7 cm Vascular Assessment Pulses: Dorsalis Pedis Palpable: [Left:Yes] [Right:Yes] Electronic Signature(s) Signed: 08/17/2022 5:06:55 PM By: [name] Signed: 08/18/2022 7:25:15 AM By: Gretta Cool, BSN, RN, CWS, Kim RN, BSN Entered By: [name] on 08/17/2022 13:13:47 -------------------------------------------------------------------------------- Multi Wound Chart Details Patient Name: Date of  Service: Sarah Shi Weiss. 08/17/2022 12:45 PM Medical Record Number: [id number] Patient Account Number: [id number] Date of Birth/Sex: Treating RN: September 21, 1934 (88 y.o. [name] Primary Care Skylen Spiering: [name] Other Clinician: Massie Weiss Referring Camylle Whicker: Treating Gargi Berch/Extender: Maurilio Lovely Weeks in Treatment: 12 Vital Signs Height(in): 64 Pulse(bpm): 37 Weight(lbs): 166 Blood Pressure(mmHg): 140/69 Body Mass Index(BMI): 28.5 Temperature(F): 97.9 Respiratory Rate(breaths/min): 18 [10:Photos:] Right, Lateral Lower Leg Right, Proximal, Medial Lower Leg Right, Distal, Medial Lower Leg Wound Location: Gradually Appeared Pressure  Injury Trauma Wounding Event: MATEA, STANARD ([id number]) [phone].pdf Page 5 of 14 Venous Leg Ulcer Diabetic Wound/Ulcer of the Lower Diabetic Wound/Ulcer of the Lower Primary Etiology: Extremity Extremity Congestive Heart Failure, Congestive Heart Failure, Congestive Heart Failure, Comorbid History: Hypertension, Type II Diabetes, Hypertension, Type II Diabetes, Hypertension, Type II Diabetes, Received Radiation Received Radiation Received Radiation 07/05/2022 08/17/2022 04/19/2022 Date Acquired: 6 0 12 Weeks of Treatment: Open Open Open Wound Status: No No No Wound Recurrence: 0.7x1.4x0.2 2.3x2.3x0.1 0.9x2x0.2 Measurements L x W x D (cm) 0.77 4.155 1.414 A (cm) : rea 0.154 0.415 0.283 Volume (cm) : 9.20% N/A 85.50% % Reduction in Area: -81.20% N/A 85.50% % Reduction in Volume: Full Thickness Without Exposed Grade 1 Grade 2 Classification: Support Structures Medium Medium Medium Exudate Amount: Serous Serosanguineous Serosanguineous Exudate Type: amber red, brown red, brown Exudate Color: N/A Flat and Intact N/A Wound Margin: None Present (0%) None Present (0%) N/A Granulation Amount: Large (67-100%) Small (1-33%) N/A Necrotic Amount: Fat Layer (Subcutaneous  Tissue): Yes Fat Layer (Subcutaneous Tissue): Yes N/A Exposed Structures: Fascia: No Fascia: No Tendon: No Tendon: No Muscle: No Muscle: No Joint: No Joint: No Bone: No Bone: No None N/A N/A Epithelialization: Wound Number: 8 N/A N/A Photos: N/A N/A Left, Distal, Lateral Lower Leg N/A N/A Wound Location: Trauma N/A N/A Wounding Event: Diabetic Wound/Ulcer of the Lower N/A N/A Primary Etiology: Extremity Congestive Heart Failure, N/A N/A Comorbid History: Hypertension, Type II Diabetes, Received Radiation 04/24/2022 N/A N/A Date Acquired: 12 N/A N/A Weeks of Treatment: Open N/A N/A Wound Status: No N/A N/A Wound Recurrence: 0.1x0.1x0.1 N/A N/A Measurements L x W x D (cm) 0.008 N/A N/A A (cm) : rea 0.001 N/A N/A Volume (cm) : 99.70% N/A N/A % Reduction in A rea: 99.60% N/A N/A % Reduction in Volume: Grade 2 N/A N/A Classification: Medium N/A N/A Exudate A mount: Serosanguineous N/A N/A Exudate Type: red, brown N/A N/A Exudate Color: N/A N/A N/A Wound Margin: N/A N/A N/A Granulation A mount: N/A N/A N/A Necrotic A mount: N/A N/A N/A Exposed Structures: N/A N/A N/A Epithelialization: Treatment Notes Electronic Signature(s) Signed: 08/17/2022 5:06:55 PM By: [name] Entered By: [name] on 08/17/2022 13:14:10 [name] ([id number]) [id number] Page 6 of 14 -------------------------------------------------------------------------------- Multi-Disciplinary Care Plan Details Patient Name: Date of Service: [name], [name] 08/17/2022 12:45 PM Medical Record Number: [id number] Patient Account Number: [id number] Date of Birth/Sex: Treating RN: October 11, 1933 (88 y.o. Charolette Forward, Kim Primary Care Marshella Tello: [name] Other Clinician: Massie Weiss Referring Carylon Tamburro: Treating Meerab Maselli/Extender: [name] in Treatment: 12 Active Inactive Necrotic Tissue Nursing  Diagnoses: Impaired tissue integrity related to necrotic/devitalized tissue Knowledge deficit related to management of necrotic/devitalized tissue Goals: Necrotic/devitalized tissue will be minimized in the wound bed Date Initiated: 07/06/2022 Target Resolution Date: 07/06/2022 Goal Status: Active Patient/caregiver will verbalize understanding of reason and process for debridement of necrotic tissue Date Initiated: 07/06/2022 Target Resolution Date: 07/06/2022 Goal Status: Active Interventions: Assess patient pain level pre-, during and post procedure and prior to discharge Provide education on necrotic tissue and debridement process Treatment Activities: Excisional debridement : 07/06/2022 Notes: Wound/Skin Impairment Nursing Diagnoses: Knowledge deficit related to ulceration/compromised skin integrity Goals: Patient/caregiver will verbalize understanding of skin care regimen Date Initiated: 05/23/2022 Target Resolution Date: 07/23/2022 Goal Status: Active Ulcer/skin breakdown will have a volume reduction of 30% by week 4 Date Initiated: 05/23/2022 Date Inactivated: 06/29/2022 Target Resolution Date: 06/23/2022 Goal Status: Unmet Unmet Reason: comorbdities Ulcer/skin breakdown will have a volume  reduction of 50% by week 8 Date Initiated: 05/23/2022 Target Resolution Date: 07/23/2022 Goal Status: Active Ulcer/skin breakdown will have a volume reduction of 80% by week 12 Date Initiated: 05/23/2022 Target Resolution Date: 08/23/2022 Goal Status: Active Ulcer/skin breakdown will heal within 14 weeks Date Initiated: 05/23/2022 Target Resolution Date: 09/22/2022 Goal Status: Active Interventions: Assess patient/caregiver ability to obtain necessary supplies Assess patient/caregiver ability to perform ulcer/skin care regimen upon admission and as needed Assess ulceration(s) every visit Notes: [name], [name] ([id number]) [phone].pdf Page 7 of 14 Electronic  Signature(s) Signed: 08/17/2022 5:06:55 PM By: [name] Signed: 08/18/2022 7:25:15 AM By: Gretta Cool, BSN, RN, CWS, Kim RN, BSN Entered By: [name] on 08/17/2022 13:14:01 -------------------------------------------------------------------------------- Pain Assessment Details Patient Name: Date of Service: Sarah Shi Weiss. 08/17/2022 12:45 PM Medical Record Number: [id number] Patient Account Number: [id number] Date of Birth/Sex: Treating RN: 1933-11-29 (88 y.o. [name] Primary Care Libbey Duce: [name] Other Clinician: Massie Weiss Referring Nataniel Gasper: Treating Tajay Muzzy/Extender: [name] in Treatment: 12 Active Problems Location of Pain Severity and Description of Pain Patient Has Paino Yes Site Locations Duration of the Pain. Constant / Intermittento Intermittent Rate the pain. Current Pain Level: 0 Worst Pain Level: 5 Least Pain Level: 0 Character of Pain Describe the Pain: Shooting Pain Management and Medication Current Pain Management: Medication: Yes Cold Application: No Rest: No Massage: No Activity: No WeissE.N.S.: No Heat Application: No Leg drop or elevation: No Is the Current Pain Management Adequate: Inadequate How does your wound impact your activities of daily livingo Sleep: Yes Bathing: No Appetite: No Relationship With Others: No Bladder Continence: No Emotions: No Bowel Continence: No Work: No Toileting: No Drive: No Dressing: No Hobbies: No Electronic Signature(s) Signed: 08/17/2022 5:06:55 PM By: [name] Signed: 08/18/2022 7:25:15 AM By: Gretta Cool, BSN, RN, CWS, Kim RN, BSN Entered By: [name] on 08/17/2022 12:56:34 [name] ([id number]) [id number] Page 8 of 14 -------------------------------------------------------------------------------- Patient/Caregiver Education Details Patient Name: Date of Service: Shavonta, Gossen [name] 11/16/2023andnbsp12:45  PM Medical Record Number: [id number] Patient Account Number: [id number] Date of Birth/Gender: Treating RN: 1933/11/19 (88 y.o. [name] Primary Care Physician: [name] Other Clinician: Massie Weiss Referring Physician: Treating Physician/Extender: [name] in Treatment: 12 Education Assessment Education Provided To: Patient and Caregiver Education Topics Provided Venous: Handouts: Controlling Swelling with Compression Stockings Methods: Explain/Verbal Responses: State content correctly Electronic Signature(s) Signed: 08/17/2022 5:06:55 PM By: [name] Entered By: [name] on 08/17/2022 13:39:15 -------------------------------------------------------------------------------- Wound Assessment Details Patient Name: Date of Service: Sarah Shi Weiss. 08/17/2022 12:45 PM Medical Record Number: [id number] Patient Account Number: [id number] Date of Birth/Sex: Treating RN: 21-Apr-1934 (88 y.o. [name] Primary Care Sahasra Belue: [name] Other Clinician: Massie Weiss Referring Kalimah Capurro: Treating Bette Brienza/Extender: Maurilio Lovely Weeks in Treatment: 12 Wound Status Wound Number: 10 Primary Venous Leg Ulcer Etiology: Wound Location: Right, Lateral Lower Leg Wound Open Wounding Event: Gradually Appeared Status: Date Acquired: 07/05/2022 Comorbid Congestive Heart Failure, Hypertension, Type II Diabetes, Weeks Of Treatment: 6 History: Received Radiation Clustered Wound: No Photos [name], [name] ([id number]) [phone].pdf Page 9 of 14 Wound Measurements Length: (cm) 0.7 Width: (cm) 1.4 Depth: (cm) 0.2 Area: (cm) 0.77 Volume: (cm) 0.154 % Reduction in Area: 9.2% % Reduction in Volume: -81.2% Epithelialization: None Wound Description Classification: Full Thickness Without Exposed Support Structures Exudate Amount: Medium Exudate Type: Serous Exudate Color: amber Foul Odor  After Cleansing: No Slough/Fibrino Yes Wound Bed Granulation Amount: None Present (0%) Exposed  Structure Necrotic Amount: Large (67-100%) Fascia Exposed: No Necrotic Quality: Adherent Slough Fat Layer (Subcutaneous Tissue) Exposed: Yes Tendon Exposed: No Muscle Exposed: No Joint Exposed: No Bone Exposed: No Treatment Notes Wound #10 (Lower Leg) Wound Laterality: Right, Lateral Cleanser Byram Ancillary Kit - 15 Day Supply Discharge Instruction: Use supplies as instructed; Kit contains: (15) Saline Bullets; (15) 3x3 Gauze; 15 pr Gloves Soap and Water Discharge Instruction: Gently cleanse wound with antibacterial soap, rinse and pat dry prior to dressing wounds Wound Cleanser Discharge Instruction: Wash your hands with soap and water. Remove old dressing, discard into plastic bag and place into trash. Cleanse the wound with Wound Cleanser prior to applying a clean dressing using gauze sponges, not tissues or cotton balls. Do not scrub or use excessive force. Pat dry using gauze sponges, not tissue or cotton balls. Peri-Wound Care Topical Primary Dressing Silvercel 4 1/4x 4 1/4 (in/in) Discharge Instruction: Apply Silvercel 4 1/4x 4 1/4 (in/in) as instructed Secondary Dressing Gauze Discharge Instruction: As directed: dry, Secured With [facility] Weiss ape ape, 2x2 (in/yd) Compression Wrap Compression Stockings Add-Ons Electronic Signature(s) Signed: 08/17/2022 5:06:55 PM By: [name] Signed: 08/18/2022 7:25:15 AM By: Gretta Cool, BSN, RN, CWS, Kim RN, BSN Entered By: [name] on 08/17/2022 13:06:49 [name] ([id number]) [id number] Page 10 of 14 -------------------------------------------------------------------------------- Wound Assessment Details Patient Name: Date of Service: [name], [name] 08/17/2022 12:45 PM Medical Record Number: [id number] Patient Account Number: [id number] Date of  Birth/Sex: Treating RN: 10/20/1933 (88 y.o. Charolette Forward, Kim Primary Care Topaz Raglin: [name] Other Clinician: Massie Weiss Referring Jorian Willhoite: Treating Jaylia Pettus/Extender: Maurilio Lovely Weeks in Treatment: 12 Wound Status Wound Number: 11 Primary Diabetic Wound/Ulcer of the Lower Extremity Etiology: Wound Location: Right, Proximal, Medial Lower Leg Wound Open Wounding Event: Pressure Injury Status: Date Acquired: 08/17/2022 Comorbid Congestive Heart Failure, Hypertension, Type II Diabetes, Weeks Of Treatment: 0 History: Received Radiation Clustered Wound: No Photos Wound Measurements Length: (cm) 2.3 Width: (cm) 2.3 Depth: (cm) 0.1 Area: (cm) 4.155 Volume: (cm) 0.415 % Reduction in Area: % Reduction in Volume: Tunneling: No Undermining: No Wound Description Classification: Grade 1 Wound Margin: Flat and Intact Exudate Amount: Medium Exudate Type: Serosanguineous Exudate Color: red, brown Foul Odor After Cleansing: No Slough/Fibrino Yes Wound Bed Granulation Amount: None Present (0%) Exposed Structure Necrotic Amount: Small (1-33%) Fascia Exposed: No Necrotic Quality: Adherent Slough Fat Layer (Subcutaneous Tissue) Exposed: Yes Tendon Exposed: No Muscle Exposed: No Joint Exposed: No Bone Exposed: No Treatment Notes Wound #11 (Lower Leg) Wound Laterality: Right, Medial, Proximal Cleanser Byram Ancillary Kit - 15 Day Supply Discharge Instruction: Use supplies as instructed; Kit contains: (15) Saline Bullets; (15) 3x3 Gauze; 15 pr Gloves Jungbluth, Shavana Weiss ([id number]) [id number] Page 11 of 14 Soap and Water Discharge Instruction: Gently cleanse wound with antibacterial soap, rinse and pat dry prior to dressing wounds Wound Cleanser Discharge Instruction: Wash your hands with soap and water. Remove old dressing, discard into plastic bag and place into trash. Cleanse the wound with Wound Cleanser prior to applying a  clean dressing using gauze sponges, not tissues or cotton balls. Do not scrub or use excessive force. Pat dry using gauze sponges, not tissue or cotton balls. Peri-Wound Care Topical Primary Dressing Silvercel 4 1/4x 4 1/4 (in/in) Discharge Instruction: Apply Silvercel 4 1/4x 4 1/4 (in/in) as instructed Secondary Dressing Gauze Discharge Instruction: As directed: dry, Secured With [facility] Weiss ape ape, 2x2 (  in/yd) Compression Wrap Compression Stockings Add-Ons Electronic Signature(s) Signed: 08/17/2022 5:06:55 PM By: [name] Signed: 08/18/2022 7:25:15 AM By: Gretta Cool, BSN, RN, CWS, Kim RN, BSN Entered By: [name] on 08/17/2022 13:11:07 -------------------------------------------------------------------------------- Wound Assessment Details Patient Name: Date of Service: Sarah Shi Weiss. 08/17/2022 12:45 PM Medical Record Number: [id number] Patient Account Number: [id number] Date of Birth/Sex: Treating RN: [birth year] (88 y.o. [name] Primary Care Ishaan Villamar: [name] Other Clinician: Massie Weiss Referring Lakeasha Petion: Treating Denisse Whitenack/Extender: Maurilio Lovely Weeks in Treatment: 12 Wound Status Wound Number: 7 Primary Diabetic Wound/Ulcer of the Lower Extremity Etiology: Wound Location: Right, Distal, Medial Lower Leg Wound Open Wounding Event: Trauma Status: Date Acquired: 04/19/2022 Comorbid Congestive Heart Failure, Hypertension, Type II Diabetes, Weeks Of Treatment: 12 History: Received Radiation Clustered Wound: No Photos [name], [name] ([id number]) [id number] Page 12 of 14 Wound Measurements Length: (cm) 0.9 Width: (cm) 2 Depth: (cm) 0.2 Area: (cm) 1.414 Volume: (cm) 0.283 % Reduction in Area: 85.5% % Reduction in Volume: 85.5% Wound Description Classification: Grade 2 Exudate Amount: Medium Exudate Type: Serosanguineous Exudate Color: red,  brown Treatment Notes Wound #7 (Lower Leg) Wound Laterality: Right, Medial, Distal Cleanser Byram Ancillary Kit - 15 Day Supply Discharge Instruction: Use supplies as instructed; Kit contains: (15) Saline Bullets; (15) 3x3 Gauze; 15 pr Gloves Soap and Water Discharge Instruction: Gently cleanse wound with antibacterial soap, rinse and pat dry prior to dressing wounds Wound Cleanser Discharge Instruction: Wash your hands with soap and water. Remove old dressing, discard into plastic bag and place into trash. Cleanse the wound with Wound Cleanser prior to applying a clean dressing using gauze sponges, not tissues or cotton balls. Do not scrub or use excessive force. Pat dry using gauze sponges, not tissue or cotton balls. Peri-Wound Care Topical Primary Dressing Silvercel 4 1/4x 4 1/4 (in/in) Discharge Instruction: Apply Silvercel 4 1/4x 4 1/4 (in/in) as instructed Secondary Dressing Gauze Discharge Instruction: As directed: dry, Secured With [facility] Weiss ape ape, 2x2 (in/yd) Compression Wrap Compression Stockings Add-Ons Electronic Signature(s) Signed: 08/17/2022 5:06:55 PM By: [name] Signed: 08/18/2022 7:25:15 AM By: Gretta Cool, BSN, RN, CWS, Kim RN, BSN Entered By: [name] on 08/17/2022 13:08:10 [name] ([id number]) [id number] Page 13 of 14 -------------------------------------------------------------------------------- Wound Assessment Details Patient Name: Date of Service: [name], [name] 08/17/2022 12:45 PM Medical Record Number: [id number] Patient Account Number: [id number] Date of Birth/Sex: Treating RN: 1934-03-07 (88 y.o. Charolette Forward, Kim Primary Care Dylann Gallier: [name] Other Clinician: Massie Weiss Referring Latora Quarry: Treating Gedalya Jim/Extender: Maurilio Lovely Weeks in Treatment: 12 Wound Status Wound Number: 8 Primary Diabetic Wound/Ulcer of the Lower  Extremity Etiology: Wound Location: Left, Distal, Lateral Lower Leg Wound Healed - Epithelialized Wounding Event: Trauma Status: Date Acquired: 04/24/2022 Comorbid Congestive Heart Failure, Hypertension, Type II Diabetes, Weeks Of Treatment: 12 History: Received Radiation Clustered Wound: No Photos Wound Measurements Length: (cm) Width: (cm) Depth: (cm) Area: (cm) Volume: (cm) 0 % Reduction in Area: 100% 0 % Reduction in Volume: 100% 0 Epithelialization: Large (67-100%) 0 0 Wound Description Classification: Grade 2 Exudate Amount: None Present Treatment Notes Wound #8 (Lower Leg) Wound Laterality: Left, Lateral, Distal Cleanser Peri-Wound Care Topical Primary Dressing Secondary Dressing Secured With Compression Wrap Compression Stockings Add-Ons Electronic Signature(s) [name], [name] ([id number]) [id number] Page 14 of 14 Signed: 08/17/2022 5:06:55 PM By: [name] Signed: 08/18/2022 7:25:15 AM By: Gretta Cool, BSN, RN, CWS, Kim RN, BSN Entered By: [name] on 08/17/2022  13:30:52 -------------------------------------------------------------------------------- Vitals Details Patient Name: Date of Service: [name], [name] 08/17/2022 12:45 PM Medical Record Number: [id number] Patient Account Number: [id number] Date of Birth/Sex: Treating RN: 1934/02/07 (88 y.o. Charolette Forward, Kim Primary Care Estelita Iten: [name] Other Clinician: Massie Weiss Referring Modean Mccullum: Treating Bexley Laubach/Extender: [name] in Treatment: 12 Vital Signs Time Taken: 12:55 Temperature (F): 97.9 Height (in): 64 Pulse (bpm): 84 Weight (lbs): 166 Respiratory Rate (breaths/min): 18 Body Mass Index (BMI): 28.5 Blood Pressure (mmHg): 140/69 Reference Range: 80 - 120 mg / dl Electronic Signature(s) Signed: 08/17/2022 5:06:55 PM By: [name] Entered By: [name] on 08/17/2022 12:56:29

## 2022-08-22 ENCOUNTER — Ambulatory Visit: Payer: Medicare Other | Admitting: Internal Medicine

## 2022-08-29 ENCOUNTER — Ambulatory Visit (INDEPENDENT_AMBULATORY_CARE_PROVIDER_SITE_OTHER): Payer: Medicare Other | Admitting: Adult Health

## 2022-08-29 ENCOUNTER — Encounter: Payer: Self-pay | Admitting: Adult Health

## 2022-08-29 VITALS — BP 126/76 | HR 64 | Temp 97.7°F | Ht 64.0 in | Wt 165.0 lb

## 2022-08-29 DIAGNOSIS — I5042 Chronic combined systolic (congestive) and diastolic (congestive) heart failure: Secondary | ICD-10-CM

## 2022-08-29 DIAGNOSIS — N186 End stage renal disease: Secondary | ICD-10-CM

## 2022-08-29 DIAGNOSIS — R0600 Dyspnea, unspecified: Secondary | ICD-10-CM | POA: Insufficient documentation

## 2022-08-29 DIAGNOSIS — J9611 Chronic respiratory failure with hypoxia: Secondary | ICD-10-CM | POA: Diagnosis not present

## 2022-08-29 DIAGNOSIS — Z992 Dependence on renal dialysis: Secondary | ICD-10-CM

## 2022-08-29 DIAGNOSIS — R0609 Other forms of dyspnea: Secondary | ICD-10-CM

## 2022-08-29 NOTE — Assessment & Plan Note (Signed)
Dyspnea suspect is multifactorial.  Patient declines PFTs, chest x-ray.  She has multiple comorbidities with combined systolic and diastolic heart failure, end-stage renal disease requiring dialysis with history of pulmonary edema.  She does not appear to be in overt fluid overload.  She has no significant cough or wheezing.  No previous diagnosis of known lung disease.  She is a former smoker but quit greater than 30 years ago..  For now we will continue on oxygen 2 L to maintain O2 saturations greater than 90%.  Plan  Patient Instructions  Continue on Oxygen 2l/m , goal is to keep O2 sats >90%.  Activity as tolerated  Continue follow up with Cardiology , Nephrology .  Continue with Dialysis .  Follow up with Dr. Mortimer Fries in 6 months and As needed

## 2022-08-29 NOTE — Assessment & Plan Note (Signed)
Does not appear to be in overt failure on exam.  Continue with follow-up with cardiology and nephrology.  Continue with dialysis.

## 2022-08-29 NOTE — Assessment & Plan Note (Signed)
Continue follow-up with nephrology and dialysis 3 days a week.

## 2022-08-29 NOTE — Assessment & Plan Note (Signed)
Currently maintained on oxygen at 2 L.  No increased oxygen demands. We discussed work-up for hypoxemia with PFTs, chest x-ray possible CT scan however patient declines at this time. We discussed using a more portable system however patient wants to continue with her current E cylinder tanks.  Does not want a POC. She declines flu shot. Plan  Patient Instructions  Continue on Oxygen 2l/m , goal is to keep O2 sats >90%.  Activity as tolerated  Continue follow up with Cardiology , Nephrology .  Continue with Dialysis .  Follow up with Dr. Mortimer Fries in 6 months and As needed

## 2022-08-29 NOTE — Patient Instructions (Signed)
Continue on Oxygen 2l/m , goal is to keep O2 sats >90%.  Activity as tolerated  Continue follow up with Cardiology , Nephrology .  Continue with Dialysis .  Follow up with Dr. Mortimer Fries in 6 months and As needed

## 2022-08-29 NOTE — Progress Notes (Signed)
$'@Patient'I$  ID: [name], female    DOB: 1934/09/19, 88 y.o.   MRN: [id number]  Chief Complaint  Patient presents with   Follow-up    Referring provider: Idelle Crouch, MD  HPI: 88 year old female former smoker seen for pulmonary consult April 12, 2021 for shortness of breath and chronic respiratory failure on home oxygen History of COVID 19 infection complicated by COVID-pneumonia with hospitalization last September 2022.  Complicated by acute metabolic encephalopathy, acute on chronic respiratory failure . Extensive medical history with previous stroke, coronary artery disease status post CABG, end-stage renal disease on dialysis Monday Wednesday Friday, combined congestive heart failure (last EF 30-35%), history of breast cancer-2012  TEST/EVENTS :   08/29/2022 Follow up : O2 [facility] Dorothyann Gibbs  Patient returns for 17-monthfollow-up.  Patient has multiple comorbidities including coronary artery disease, combined congestive heart failure, end-stage renal disease.  She does hemodialysis 3 days a week. Patient is on chronic oxygen 2 L.  She is a former smoker.  Does not have a previous diagnosis of COPD asthma or emphysema.  No previous PFT testing.  She denies any cough or wheezing.  Says she gets short of breath with activities.  Is sedentary cannot walk a long distance.  Has chronic lower extremity edema and is unable to use support stockings.  Has been to the wound center for leg wraps but says it does not work.  She has been recommended for a sleep study.  She declines.  We discussed chest x-ray and PFTs patient says that she does not want to go through with these testings at this time.  She says she only gets short of breath when she is moving around.  She says she wears her oxygen 2 L all the time.  This really helps quite a bit.  She is using a E cylinder tank.  We discussed getting a portable oxygen device however patient declines.  Says that she does not want to change the current  system she has.  She denies any pain orthopnea or hemoptysis.    Allergies  Allergen Reactions   Clonidine Other (See Comments) and Shortness Of Breath    Other reaction(s): Other (see comments) Other reaction(s): Other (See Comments)   Darvocet [Propoxyphene N-Acetaminophen] Anaphylaxis   Hydralazine     Other reaction(s): Other (See Comments) glomerulonephritis   Hydralazine Hcl Other (See Comments)    glomerulonephritis   Metoprolol Shortness Of Breath    Pt reports "very winded, couldn't catch breath"    Esomeprazole     Other reaction(s): Unknown   Guaifenesin     Other reaction(s): Unknown Other reaction(s): Unknown Other reaction(s): Unknown Other reaction(s): Unknown   Phenylephrine-Guaifenesin     Other reaction(s): Unknown   Rabeprazole     Other reaction(s): Unknown   Telithromycin     Other reaction(s): Unknown Other reaction(s): Unknown Other reaction(s): Unknown   Fluocinolone Other (See Comments)    Feels crazy Other reaction(s): Other (see comments) Other reaction(s): Other (See Comments) Feels crazy Feels crazy   Iodinated Contrast Media Nausea And Vomiting and Other (See Comments)    Burning Other reaction(s): Other (see comments) Other reaction(s): Other (See Comments) Burning Nausea & vomiting   Levaquin [Levofloxacin] Nausea Only   Statins Other (See Comments)    Myalgia Other reaction(s): Other (See Comments), Unknown, Unknown Myalgia    Immunization History  Administered Date(s) Administered   Influenza-Unspecified 11/29/2020   Pneumococcal-Unspecified 11/02/2016, 11/29/2020    Past Medical History:  Diagnosis Date  Arthritis    Bilateral Macular degeneration    Breast cancer ([place]) 2012   right breast   Breast mass, right    CAD S/P CABG x 4    a. 09/2012 CABG x 4: LIMA to LAD, SVG to Diag, SVG to OM1, SVG to PDA, EVH from bilateral thighs   Cancer ([place])    breast cancer, right side 3614   Complication of anesthesia     Coronary artery disease 08/22/2012   sees Dr Rockey Situ   Diabetes [facility])    Diastolic dysfunction    a. 06/2020 Echo: EF 60-65%; b. 08/2020 Echo: EF 60-65%, no rwma, Gr1 DD. Nl RV size/fxn.   DOE (dyspnea on exertion)    ESRD (end stage renal disease) ([place])    Gastritis    hx of   GERD (gastroesophageal reflux disease)    Headache(784.0)    migraines   History of breast cancer    39 treatments of radiation. Negative chemo.   History of seasonal allergies    Hyperlipemia    Hypertension    sees Dr. Fulton Reek   Hypothyroidism    Neuropathy    Personal history of radiation therapy    Pneumonia    hx of   PONV (postoperative nausea and vomiting)    Stroke ([place])    2012   Vertigo     Tobacco History: Social History   Tobacco Use  Smoking Status Former   Years: 3.00   Types: Cigarettes   Quit date: 11/13/1988   Years since quitting: 33.8  Smokeless Tobacco Never  Tobacco Comments   At the most 1 pack would last over a week.   Counseling given: Not Answered Tobacco comments: At the most 1 pack would last over a week.   Outpatient Medications Prior to Visit  Medication Sig Dispense Refill   acetaminophen (TYLENOL) 325 MG tablet Take 2 tablets (650 mg total) by mouth every 6 (six) hours as needed for headache.     ALPRAZolam (XANAX) 0.5 MG tablet Take 1 tablet (0.5 mg total) by mouth 2 (two) times daily as needed for anxiety. 10 tablet 0   calcium-vitamin D (OSCAL WITH D) 500-200 MG-UNIT tablet Take 1 tablet by mouth daily.     clopidogrel (PLAVIX) 75 MG tablet Take 75 mg by mouth daily.     Cyanocobalamin 1000 MCG SUBL Take 1,000 mcg by mouth daily.      famotidine (PEPCID) 10 MG tablet Take 1 tablet (10 mg total) by mouth daily.     gabapentin (NEURONTIN) 100 MG capsule Take 1 capsule by mouth 2 (two) times daily.     levothyroxine (SYNTHROID) 150 MCG tablet Take 150 mcg by mouth daily before breakfast.     ondansetron (ZOFRAN ODT) 4 MG disintegrating tablet Take 1  tablet (4 mg total) by mouth every 8 (eight) hours as needed for nausea or vomiting. 20 tablet 2   OXYGEN Inhale 2 L into the lungs daily.     torsemide (DEMADEX) 100 MG tablet Take 1 tablet (100 mg total) by mouth 3 (three) times a week. Tuesday, Thursday, & Saturday (non dialysis days) 50 tablet 0   HYDROcodone-acetaminophen (NORCO/VICODIN) 5-325 MG tablet Take 1 tablet by mouth every 6 (six) hours as needed for moderate pain. 10 tablet 0   metoprolol tartrate (LOPRESSOR) 25 MG tablet Take 0.5 tablets (12.5 mg total) by mouth 2 (two) times daily. 90 tablet 3   No facility-administered medications prior to visit.     Review  of Systems:   Constitutional:   No  weight loss, night sweats,  Fevers, chills, + fatigue, or  lassitude.  HEENT:   No headaches,  Difficulty swallowing,  Tooth/dental problems, or  Sore throat,                No sneezing, itching, ear ache, nasal congestion, post nasal drip,   CV:  No chest pain,  Orthopnea, PND, swelling in lower extremities, anasarca, dizziness, palpitations, syncope.   GI  No heartburn, indigestion, abdominal pain, nausea, vomiting, diarrhea, change in bowel habits, loss of appetite, bloody stools.   Resp:   No chest wall deformity  Skin: no rash or lesions.  GU: no dysuria, change in color of urine, no urgency or frequency.  No flank pain, no hematuria   MS:  No joint pain or swelling.  No decreased range of motion.  No back pain.    Physical Exam  BP 126/76 (BP Location: Left Arm, Cuff Size: Normal)   Pulse 64   Temp 97.7 F (36.5 C) (Temporal)   Ht '5\' 4"'$  (1.626 m)   Wt 165 lb (74.8 kg) Comment: weight is per pt  SpO2 96%   BMI 28.32 kg/m   GEN: A/Ox3; pleasant , NAD, elderly, wheelchair, oxygen   HEENT:  [place]/AT,   NOSE-clear, THROAT-clear, no lesions, no postnasal drip or exudate noted.   NECK:  Supple w/ fair ROM; no JVD; normal carotid impulses w/o bruits; no thyromegaly or nodules palpated; no lymphadenopathy.    RESP   Clear  P & A; w/o, wheezes/ rales/ or rhonchi. no accessory muscle use, no dullness to percussion  CARD:  RRR, no m/r/g, 2+ peripheral edema, pulses intact, no cyanosis or clubbing.  GI:   Soft & nt; nml bowel sounds; no organomegaly or masses detected.   Musco: Warm bil, no deformities or joint swelling noted.   Neuro: alert, no focal deficits noted.    Skin: Warm, no lesions or rashes    Lab Results:  CBC   BNP  ProBNP No results found for: "PROBNP"  Imaging: No results found.        No data to display          No results found for: "NITRICOXIDE"      Assessment & Plan:   Chronic respiratory failure with hypoxia (HCC) Currently maintained on oxygen at 2 L.  No increased oxygen demands. We discussed work-up for hypoxemia with PFTs, chest x-ray possible CT scan however patient declines at this time. We discussed using a more portable system however patient wants to continue with her current E cylinder tanks.  Does not want a POC. She declines flu shot. Plan  Patient Instructions  Continue on Oxygen 2l/m , goal is to keep O2 sats >90%.  Activity as tolerated  Continue follow up with Cardiology , Nephrology .  Continue with Dialysis .  Follow up with Dr. Mortimer Fries in 6 months and As needed     Chronic combined systolic and diastolic congestive heart failure ([place]) Does not appear to be in overt failure on exam.  Continue with follow-up with cardiology and nephrology.  Continue with dialysis.  ESRD on hemodialysis [facility]) Continue follow-up with nephrology and dialysis 3 days a week.  Dyspnea Dyspnea suspect is multifactorial.  Patient declines PFTs, chest x-ray.  She has multiple comorbidities with combined systolic and diastolic heart failure, end-stage renal disease requiring dialysis with history of pulmonary edema.  She does not appear to be in overt  fluid overload.  She has no significant cough or wheezing.  No previous diagnosis of known lung disease.  She  is a former smoker but quit greater than 30 years ago..  For now we will continue on oxygen 2 L to maintain O2 saturations greater than 90%.  Plan  Patient Instructions  Continue on Oxygen 2l/m , goal is to keep O2 sats >90%.  Activity as tolerated  Continue follow up with Cardiology , Nephrology .  Continue with Dialysis .  Follow up with Dr. Mortimer Fries in 6 months and As needed        Rexene Edison, NP 08/29/2022

## 2022-08-31 ENCOUNTER — Encounter: Payer: Medicare Other | Admitting: Physician Assistant

## 2022-08-31 DIAGNOSIS — E11621 Type 2 diabetes mellitus with foot ulcer: Secondary | ICD-10-CM | POA: Diagnosis not present

## 2022-08-31 NOTE — Progress Notes (Addendum)
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 2x2 (in/yd) 3 x Per Week/30 Days WOUND #11: - Lower Leg Wound Laterality: Right, Medial, Proximal Cleanser: Byram Ancillary Kit  - 15 Day Supply (Generic) 3 x Per Week/30 Days Discharge Instructions: Use supplies as instructed; Kit contains: (15) Saline Bullets; (15) 3x3 Gauze; 15 pr Gloves Cleanser: Soap and Water 3 x Per Week/30 Days Discharge Instructions: Gently cleanse wound with antibacterial soap, rinse and pat dry prior to dressing wounds Cleanser: Wound Cleanser 3 x Per Week/30 Days Discharge Instructions: Wash your hands with soap and water. Remove old dressing, discard into plastic bag and place into trash. Cleanse the wound with Wound Cleanser prior to applying a clean dressing using gauze sponges, not tissues or cotton balls. Do not scrub or use excessive force. Pat dry using gauze sponges, not tissue or cotton balls. Prim Dressing: Silvercel 4 1/4x 4 1/4 (in/in) (Generic) 3 x Per Week/30 Days ary Discharge Instructions: Apply Silvercel 4 1/4x 4 1/4 (in/in) as instructed Secondary Dressing: Gauze (Generic) 3 x Per Week/30 Days Discharge Instructions: As directed: dry, Secured With: Medipore T - 61M Medipore H Soft Cloth Surgical T ape ape, 2x2 (in/yd) 3 x Per Week/30 Days WOUND #7: - Lower Leg Wound Laterality: Right, Medial, Distal Cleanser: Byram Ancillary Kit - 15 Day Supply (Generic) 3 x Per Week/30 Days Discharge Instructions: Use supplies as instructed; Kit contains: (15) Saline Bullets; (15) 3x3 Gauze; 15 pr Gloves Cleanser: Soap and Water 3 x Per Week/30 Days Discharge Instructions: Gently cleanse wound with antibacterial soap, rinse and pat dry prior to dressing wounds Cleanser: Wound Cleanser 3 x Per Week/30 Days Discharge Instructions: Wash your hands with soap and water. Remove old dressing, discard into plastic bag and place into trash. Cleanse the wound with Wound Cleanser prior to applying a clean dressing using gauze sponges, not tissues or cotton balls. Do not scrub or use excessive force. Pat dry using gauze sponges, not tissue or cotton balls. Prim Dressing: Silvercel 4 1/4x 4 1/4 (in/in)  (Generic) 3 x Per Week/30 Days ary  Discharge Instructions: Apply Silvercel 4 1/4x 4 1/4 (in/in) as instructed Secondary Dressing: Gauze (Generic) 3 x Per Week/30 Days Discharge Instructions: As directed: dry, ALMINA, SCHUL ([id number]) [id number] Page 10 of 10 Secured With: Medipore T - 65M Medipore H Soft Cloth Surgical T ape ape, 2x2 (in/yd) 3 x Per Week/30 Days 1. I am going to suggest that we have the patient continue to monitor for any evidence of worsening or infection. Obviously based on what I am seeing I do believe that she is making good progress here. Fortunately there does not appear to be any signs of infection systemically or locally at this point. 2. I am going to suggest that we continue with the silver cell secured with gauze and tape. She has refused any additional compression even Tubigrip therefore we are just trying to manage things as best we can her legs are extremely edematous. We will see patient back for reevaluation in 1 week here in the clinic. If anything worsens or changes patient will contact our office for additional recommendations. Electronic Signature(s) Signed: 08/31/2022 5:06:54 PM By: Worthy Keeler PA-C Entered By: Worthy Keeler on 08/31/2022 17:06:54 -------------------------------------------------------------------------------- SuperBill Details Patient Name: Date of Service: Andres Labrum, [name] 08/31/2022 Medical Record Number: [id number] Patient Account Number: [id number] Date of Birth/Sex: Treating RN: 06-Feb-1934 (88 y.o. 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 Facility Procedures : CPT4 Code: [id number] Description: [facility] VISIT-LEV 4 EST PT Modifier: Quantity: 1 Physician Procedures : CPT4 Code Description Modifier [id number] - WC PHYS LEVEL 3 - EST PT ICD-10 Diagnosis Description I87.333 Chronic venous hypertension (idiopathic) with ulcer and inflammation of bilateral lower extremity L97.812 Non-pressure chronic ulcer of other  part of right lower leg with fat layer exposed L97.822 Non-pressure chronic ulcer of other part of left lower leg with fat layer exposed I10 Essential (primary) hypertension Quantity: 1 Electronic Signature(s) Signed: 08/31/2022 5:07:15 PM By: Worthy Keeler PA-C Entered By: Worthy Keeler on 08/31/2022 17:07:14

## 2022-08-31 NOTE — Progress Notes (Addendum)
[name] ([id number]) [id number] Page 1 of 12 Visit Report for 08/31/2022 Arrival Information Details Patient Name: Date of Service: [name], [name]. 08/31/2022 1:30 PM Medical Record Number: [id number] Patient Account Number: [id number] Date of Birth/Sex: Treating RN: [birth year] (88 y.o. Orvan Falconer Primary Care Kasondra Junod: Fulton Reek Other Clinician: Massie Kluver Referring Brooklee Michelin: Treating Deryl Giroux/Extender: Rupert Stacks in Treatment: 14 Visit Information History Since Last Visit All ordered tests and consults were completed: No Patient Arrived: Wheel Chair Added or deleted any medications: No Arrival Time: 13:39 Any new allergies or adverse reactions: No Transfer Assistance: EasyPivot Patient Lift Had a fall or experienced change in No Patient Identification Verified: Yes activities of daily living that may affect Secondary Verification Process Completed: Yes risk of falls: Patient Requires Transmission-Based Precautions: No Signs or symptoms of abuse/neglect since last visito No Patient Has Alerts: Yes Hospitalized since last visit: No Patient Alerts: Patient on Blood Thinner Implantable device outside of the clinic excluding No DIABETIC cellular tissue based products placed in the center ABI LEFT .79 02/14/22 since last visit: ABI RIGHT 1.02 02/14/22 Has Dressing in Place as Prescribed: Yes Pain Present Now: No Electronic Signature(s) Signed: 08/31/2022 4:33:44 PM By: Massie Kluver Entered By: Massie Kluver on 08/31/2022 13:40:08 -------------------------------------------------------------------------------- Clinic Level of Care Assessment Details Patient Name: Date of Service: [name]. 08/31/2022 1:30 PM Medical Record Number: [id number] Patient Account Number: [id number] Date of Birth/Sex: Treating RN: 1934-07-16 (88 y.o. Orvan Falconer Primary Care Aradhana Gin: Fulton Reek Other Clinician: Massie Kluver Referring Bathsheba Durrett: Treating Aamani Moose/Extender: Rupert Stacks in Treatment: 14 Clinic Level of Care Assessment Items TOOL 4 Quantity Score _0  - 0 Use when only an EandM is performed on FOLLOW-UP visit ASSESSMENTS - Nursing Assessment / Reassessment X- 1 10 Reassessment of Co-morbidities (includes updates in patient status) X- 1 5 Reassessment of Adherence to Treatment Plan [name], [name] ([id number]) [id number] Page 2 of 12 ASSESSMENTS - Wound and Skin A ssessment / Reassessment _1  - 0 Simple Wound Assessment / Reassessment - one wound X- 3 5 Complex Wound Assessment / Reassessment - multiple wounds _2  - 0 Dermatologic / Skin Assessment (not related to wound area) ASSESSMENTS - Focused Assessment X- 1 5 Circumferential Edema Measurements - multi extremities _3  - 0 Nutritional Assessment / Counseling / Intervention _4  - 0 Lower Extremity Assessment (monofilament, tuning fork, pulses) _5  - 0 Peripheral Arterial Disease Assessment (using hand held doppler) ASSESSMENTS - Ostomy and/or Continence Assessment and Care _6  - 0 Incontinence Assessment and Management _7  - 0 Ostomy Care Assessment and Management (repouching, etc.) PROCESS - Coordination of Care X - Simple Patient / Family Education for ongoing care 1 15 _8  - 0 Complex (extensive) Patient / Family Education for ongoing care _9  - 0 Staff obtains Programmer, systems, Records, Weiss Results / Process Orders est _10  - 0 Staff telephones HHA, Nursing Homes / Clarify orders / etc _11  - 0 Routine Transfer to another Facility (non-emergent condition) _12  - 0 Routine Hospital Admission (non-emergent condition) _13  - 0 New Admissions / Biomedical engineer / Ordering NPWT Apligraf, etc. , _14  - 0 Emergency Hospital Admission (emergent condition) X- 1 10 Simple Discharge Coordination _15  - 0 Complex (extensive) Discharge Coordination PROCESS -  Special Needs _16  - 0 Pediatric / Minor Patient Management _17  - 0 Isolation Patient Management _18  - 0 Hearing / Language / Visual special needs _19  - 0 Assessment of Community assistance (transportation, D/C planning, etc.) _20  - 0 Additional assistance /  Altered mentation _0  - 0 Support Surface(s) Assessment (bed, cushion, seat, etc.) INTERVENTIONS - Wound Cleansing / Measurement _1  - 0 Simple Wound Cleansing - one wound X- 3 5 Complex Wound Cleansing - multiple wounds X- 1 5 Wound Imaging (photographs - any number of wounds) _2  - 0 Wound Tracing (instead of photographs) _3  - 0 Simple Wound Measurement - one wound X- 3 5 Complex Wound Measurement - multiple wounds INTERVENTIONS - Wound Dressings _4  - 0 Small Wound Dressing one or multiple wounds X- 3 15 Medium Wound Dressing one or multiple wounds _5  - 0 Large Wound Dressing one or multiple wounds <BZJIRCVELFYBOFBP>_1<\/WCHENIDPOEUMPNTI>_1  - 0 Application of Medications - topical <WERXVQMGQQPYPPJK>_9<\/TOIZTIWPYKDXIPJA>_2  - 0 Application of Medications - injection INTERVENTIONS - Miscellaneous _8  - 0 External ear exam [name], [name] ([id number]) [id number] Page 3 of 12 _9  - 0 Specimen Collection (cultures, biopsies, blood, body fluids, etc.) _10  - 0 Specimen(s) / Culture(s) sent or taken to Lab for analysis _11  - 0 Patient Transfer (multiple staff / Harrel Lemon Lift / Similar devices) _12  - 0 Simple Staple / Suture removal (25 or less) _13  - 0 Complex Staple / Suture removal (26 or more) _14  - 0 Hypo / Hyperglycemic Management (close monitor of Blood Glucose) _15  - 0 Ankle / Brachial Index (ABI) - do not check if billed separately X- 1 5 Vital Signs Has the patient been seen at the hospital within the last three years: Yes Total Score: 145 Level Of Care: New/Established - Level 4 Electronic Signature(s) Unsigned Previous Signature: 08/31/2022 4:33:44 PM Version By: Massie Kluver Entered By: Massie Kluver on 08/31/2022  16:37:45 -------------------------------------------------------------------------------- Encounter Discharge Information Details Patient Name: Date of Service: Sarah Shi Weiss. 08/31/2022 1:30 PM Medical Record Number: [id number] Patient Account Number: [id number] Date of Birth/Sex: Treating RN: [birth year] (88 y.o. Orvan Falconer Primary Care Pola Furno: Fulton Reek Other Clinician: Massie Kluver Referring Carmon Sahli: Treating Sharah Finnell/Extender: Rupert Stacks in Treatment: 14 Encounter Discharge Information Items Discharge Condition: Stable Ambulatory Status: Wheelchair Discharge Destination: Home Transportation: Private Auto Accompanied By: husband Schedule Follow-up Appointment: Yes Clinical Summary of Care: Electronic Signature(s) Signed: 08/31/2022 4:33:44 PM By: Massie Kluver Entered By: Massie Kluver on 08/31/2022 14:42:56 [name] ([id number]) [id number] Page 4 of 12 -------------------------------------------------------------------------------- Lower Extremity Assessment Details Patient Name: Date of Service: [name], [name] 08/31/2022 1:30 PM Medical Record Number: [id number] Patient Account Number: [id number] Date of Birth/Sex: Treating RN: 06-Oct-1933 (88 y.o. Orvan Falconer Primary Care Hoa Briggs: Fulton Reek Other Clinician: Massie Kluver Referring Vy Badley: Treating Maleyah Evans/Extender: Maurilio Lovely Weeks in Treatment: 14 Edema Assessment Assessed: [Left: Yes] Patrice Paradise: Yes] Edema: [Left: Yes] [Right: Yes] Calf Left: Right: Point of Measurement: 32 cm From Medial Instep 45 cm 43.4 cm Ankle Left: Right: Point of Measurement: 10 cm From Medial Instep 30 cm 30.5 cm Vascular Assessment Pulses: Dorsalis Pedis Palpable: [Left:Yes] [Right:Yes] Electronic Signature(s) Signed: 08/31/2022 4:33:44 PM By: Massie Kluver Signed: 08/31/2022 4:40:57 PM By: Carlene Coria RN Entered  By: Massie Kluver on 08/31/2022 14:02:27 -------------------------------------------------------------------------------- Multi Wound Chart Details Patient Name: Date of Service: Sarah Shi Weiss. 08/31/2022 1:30 PM Medical Record Number: [id number] Patient Account Number: [id number] Date of Birth/Sex: Treating RN: 1934-08-18 (88 y.o. Orvan Falconer Primary Care Muaad Boehning: Fulton Reek Other Clinician: Massie Kluver Referring Curtez Brallier: Treating Nieko Clarin/Extender: Rupert Stacks in Treatment: 14 Vital Signs Height(in): 64 Pulse(bpm): 73 Weight(lbs): 166 Blood Pressure(mmHg): 134/60 Body Mass Index(BMI): 28.5 Temperature(F): 98.3 Respiratory Rate(breaths/min): 18 [10:Photos:] [[id number] Page 5 of 12]  Right, Lateral Lower Leg Right, Proximal, Medial Lower Leg Right, Distal, Medial Lower Leg Wound Location: Gradually Appeared Pressure Injury Trauma Wounding Event: Venous Leg Ulcer Diabetic Wound/Ulcer of the Lower Diabetic Wound/Ulcer of the Lower Primary Etiology: Extremity Extremity Congestive Heart Failure, Congestive Heart Failure, Congestive Heart Failure, Comorbid History: Hypertension, Type II Diabetes, Hypertension, Type II Diabetes, Hypertension, Type II Diabetes, Received Radiation Received Radiation Received Radiation 07/05/2022 08/17/2022 04/19/2022 Date Acquired: _0 Weeks of Treatment: Open Open Open Wound Status: No No No Wound Recurrence: 0.3x0.7x0.2 2.9x2.6x0.3 0.4x1.3x0.1 Measurements L x W x D (cm) 0.165 5.922 0.408 A (cm) : rea 0.033 1.777 0.041 Volume (cm) : 80.50% -42.50% 95.80% % Reduction in Area: 61.20% -328.20% 97.90% % Reduction in Volume: Full Thickness Without Exposed Grade 1 Grade 2 Classification: Support Structures Medium Medium Medium Exudate Amount: Serous Serosanguineous Serosanguineous Exudate Type: amber red, brown red, brown Exudate Color: N/A Flat and Intact  N/A Wound Margin: None Present (0%) None Present (0%) N/A Granulation Amount: Large (67-100%) Small (1-33%) N/A Necrotic Amount: Fat Layer (Subcutaneous Tissue): Yes Fat Layer (Subcutaneous Tissue): Yes N/A Exposed Structures: Fascia: No Fascia: No Tendon: No Tendon: No Muscle: No Muscle: No Joint: No Joint: No Bone: No Bone: No None N/A N/A Epithelialization: Treatment Notes Electronic Signature(s) Signed: 08/31/2022 4:33:44 PM By: Massie Kluver Entered By: Massie Kluver on 08/31/2022 14:02:50 -------------------------------------------------------------------------------- Multi-Disciplinary Care Plan Details Patient Name: Date of Service: Sarah Shi Weiss. 08/31/2022 1:30 PM Medical Record Number: [id number] Patient Account Number: [id number] Date of Birth/Sex: Treating RN: January 13, 1934 (88 y.o. Orvan Falconer Primary Care Mehar Kirkwood: Fulton Reek Other Clinician: Massie Kluver Referring Chivon Lepage: Treating Zacory Fiola/Extender: Rupert Stacks in Treatment: 14 Active Inactive Necrotic Tissue Nursing Diagnoses: Impaired tissue integrity related to necrotic/devitalized tissue Knowledge deficit related to management of necrotic/devitalized tissue Goals: Necrotic/devitalized tissue will be minimized in the wound bed Date Initiated: 07/06/2022 Target Resolution Date: 07/06/2022 Goal Status: Active Patient/caregiver will verbalize understanding of reason and process for debridement of necrotic tissue Date Initiated: 07/06/2022 Target Resolution Date: 07/06/2022 [name], [name] ([id number]) [id number] Page 6 of 12 Goal Status: Active Interventions: Assess patient pain level pre-, during and post procedure and prior to discharge Provide education on necrotic tissue and debridement process Treatment Activities: Excisional debridement : 07/06/2022 Notes: Wound/Skin Impairment Nursing Diagnoses: Knowledge deficit related to  ulceration/compromised skin integrity Goals: Patient/caregiver will verbalize understanding of skin care regimen Date Initiated: 05/23/2022 Target Resolution Date: 07/23/2022 Goal Status: Active Ulcer/skin breakdown will have a volume reduction of 30% by week 4 Date Initiated: 05/23/2022 Date Inactivated: 06/29/2022 Target Resolution Date: 06/23/2022 Goal Status: Unmet Unmet Reason: comorbdities Ulcer/skin breakdown will have a volume reduction of 50% by week 8 Date Initiated: 05/23/2022 Target Resolution Date: 07/23/2022 Goal Status: Active Ulcer/skin breakdown will have a volume reduction of 80% by week 12 Date Initiated: 05/23/2022 Target Resolution Date: 08/23/2022 Goal Status: Active Ulcer/skin breakdown will heal within 14 weeks Date Initiated: 05/23/2022 Target Resolution Date: 09/22/2022 Goal Status: Active Interventions: Assess patient/caregiver ability to obtain necessary supplies Assess patient/caregiver ability to perform ulcer/skin care regimen upon admission and as needed Assess ulceration(s) every visit Notes: Electronic Signature(s) Signed: 08/31/2022 4:33:44 PM By: Massie Kluver Signed: 08/31/2022 4:40:57 PM By: Carlene Coria RN Entered By: Massie Kluver on 08/31/2022 14:02:35 -------------------------------------------------------------------------------- Pain Assessment Details Patient Name: Date of Service: Sarah Shi Weiss. 08/31/2022 1:30 PM Medical Record Number: [id number] Patient Account Number: [id number] Date of Birth/Sex: Treating RN: May 24, 1934 (88 y.o. Orvan Falconer Primary Care  Marin Milley: Fulton Reek Other Clinician: Massie Kluver Referring Allysen Lazo: Treating Lonnetta Kniskern/Extender: Rupert Stacks in Treatment: 14 Active Problems Location of Pain Severity and Description of Pain Patient Has Paino No Site Locations [place], [place] Weiss ([id number]) [id number] Page 7 of 12 Pain Management and  Medication Current Pain Management: Electronic Signature(s) Signed: 08/31/2022 4:33:44 PM By: Massie Kluver Signed: 08/31/2022 4:40:57 PM By: Carlene Coria RN Entered By: Massie Kluver on 08/31/2022 13:43:24 -------------------------------------------------------------------------------- Patient/Caregiver Education Details Patient Name: Date of Service: Ernestine Conrad 11/30/2023andnbsp1:30 PM Medical Record Number: [id number] Patient Account Number: [id number] Date of Birth/Gender: Treating RN: August 29, 1934 (88 y.o. Orvan Falconer Primary Care Physician: Fulton Reek Other Clinician: Massie Kluver Referring Physician: Treating Physician/Extender: Rupert Stacks in Treatment: 14 Education Assessment Education Provided To: Patient Education Topics Provided Wound/Skin Impairment: Handouts: Other: continue wound care as directed Methods: Explain/Verbal Responses: State content correctly Electronic Signature(s) Signed: 08/31/2022 4:33:44 PM By: Massie Kluver Entered By: Massie Kluver on 08/31/2022 14:42:12 [name] ([id number]) [id number] Page 8 of 12 -------------------------------------------------------------------------------- Wound Assessment Details Patient Name: Date of Service: SOLLARS, [name]. 08/31/2022 1:30 PM Medical Record Number: [id number] Patient Account Number: [id number] Date of Birth/Sex: Treating RN: [birth year] (88 y.o. Orvan Falconer Primary Care Leslyn Monda: Fulton Reek Other Clinician: Massie Kluver Referring Sonu Kruckenberg: Treating Krithik Mapel/Extender: Maurilio Lovely Weeks in Treatment: 14 Wound Status Wound Number: 10 Primary Venous Leg Ulcer Etiology: Wound Location: Right, Lateral Lower Leg Wound Open Wounding Event: Gradually Appeared Status: Date Acquired: 07/05/2022 Comorbid Congestive Heart Failure, Hypertension, Type II Diabetes, Weeks Of Treatment: 8 History:  Received Radiation Clustered Wound: No Photos Wound Measurements Length: (cm) 0.3 Width: (cm) 0.7 Depth: (cm) 0.2 Area: (cm) 0.165 Volume: (cm) 0.033 % Reduction in Area: 80.5% % Reduction in Volume: 61.2% Epithelialization: None Tunneling: No Undermining: No Wound Description Classification: Full Thickness Without Exposed Support Structures Exudate Amount: Medium Exudate Type: Serous Exudate Color: amber Foul Odor After Cleansing: No Slough/Fibrino Yes Wound Bed Granulation Amount: None Present (0%) Exposed Structure Necrotic Amount: Large (67-100%) Fascia Exposed: No Necrotic Quality: Adherent Slough Fat Layer (Subcutaneous Tissue) Exposed: Yes Tendon Exposed: No Muscle Exposed: No Joint Exposed: No Bone Exposed: No Treatment Notes Wound #10 (Lower Leg) Wound Laterality: Right, Lateral Cleanser Byram Ancillary Kit - 15 Day Supply Discharge Instruction: Use supplies as instructed; Kit contains: (15) Saline Bullets; (15) 3x3 Gauze; 15 pr Gloves Soap and [address] [place], [place]lee Weiss ([id number]) [id number] Page 9 of 12 Discharge Instruction: Gently cleanse wound with antibacterial soap, rinse and pat dry prior to dressing wounds Wound Cleanser Discharge Instruction: Wash your hands with soap and water. Remove old dressing, discard into plastic bag and place into trash. Cleanse the wound with Wound Cleanser prior to applying a clean dressing using gauze sponges, not tissues or cotton balls. Do not scrub or use excessive force. Pat dry using gauze sponges, not tissue or cotton balls. Peri-Wound Care Topical Primary Dressing Silvercel 4 1/4x 4 1/4 (in/in) Discharge Instruction: Apply Silvercel 4 1/4x 4 1/4 (in/in) as instructed Secondary Dressing Gauze Discharge Instruction: As directed: dry, Secured With [facility] Weiss ape ape, 2x2 (in/yd) Compression Wrap Compression Stockings Add-Ons Electronic  Signature(s) Signed: 08/31/2022 4:33:44 PM By: Massie Kluver Signed: 08/31/2022 4:40:57 PM By: Carlene Coria RN Entered By: Massie Kluver on 08/31/2022 13:58:24 -------------------------------------------------------------------------------- Wound Assessment Details Patient Name: Date of Service: Sarah Shi Weiss. 08/31/2022 1:30 PM Medical Record Number: [id number] Patient Account Number: [id number]  Date of Birth/Sex: Treating RN: 05-04-1934 (88 y.o. Orvan Falconer Primary Care Joycie Aerts: Fulton Reek Other Clinician: Massie Kluver Referring Ellawyn Wogan: Treating Randolf Sansoucie/Extender: Maurilio Lovely Weeks in Treatment: 14 Wound Status Wound Number: 11 Primary Diabetic Wound/Ulcer of the Lower Extremity Etiology: Wound Location: Right, Proximal, Medial Lower Leg Wound Open Wounding Event: Pressure Injury Status: Date Acquired: 08/17/2022 Comorbid Congestive Heart Failure, Hypertension, Type II Diabetes, Weeks Of Treatment: 2 History: Received Radiation Clustered Wound: No Photos [name], [name] ([id number]) [id number] Page 10 of 12 Wound Measurements Length: (cm) 2.9 Width: (cm) 2.6 Depth: (cm) 0.3 Area: (cm) 5.922 Volume: (cm) 1.777 % Reduction in Area: -42.5% % Reduction in Volume: -328.2% Tunneling: No Undermining: No Wound Description Classification: Grade 1 Wound Margin: Flat and Intact Exudate Amount: Medium Exudate Type: Serosanguineous Exudate Color: red, brown Foul Odor After Cleansing: No Slough/Fibrino Yes Wound Bed Granulation Amount: None Present (0%) Exposed Structure Necrotic Amount: Small (1-33%) Fascia Exposed: No Necrotic Quality: Adherent Slough Fat Layer (Subcutaneous Tissue) Exposed: Yes Tendon Exposed: No Muscle Exposed: No Joint Exposed: No Bone Exposed: No Treatment Notes Wound #11 (Lower Leg) Wound Laterality: Right, Medial, Proximal Cleanser Byram Ancillary Kit - 15 Day  Supply Discharge Instruction: Use supplies as instructed; Kit contains: (15) Saline Bullets; (15) 3x3 Gauze; 15 pr Gloves Soap and Water Discharge Instruction: Gently cleanse wound with antibacterial soap, rinse and pat dry prior to dressing wounds Wound Cleanser Discharge Instruction: Wash your hands with soap and water. Remove old dressing, discard into plastic bag and place into trash. Cleanse the wound with Wound Cleanser prior to applying a clean dressing using gauze sponges, not tissues or cotton balls. Do not scrub or use excessive force. Pat dry using gauze sponges, not tissue or cotton balls. Peri-Wound Care Topical Primary Dressing Silvercel 4 1/4x 4 1/4 (in/in) Discharge Instruction: Apply Silvercel 4 1/4x 4 1/4 (in/in) as instructed Secondary Dressing Gauze Discharge Instruction: As directed: dry, Secured With [facility] Weiss ape ape, 2x2 (in/yd) Compression Wrap Compression Stockings Add-Ons Electronic Signature(s) Signed: 08/31/2022 4:33:44 PM By: Massie Kluver Signed: 08/31/2022 4:40:57 PM By: Carlene Coria RN Entered By: Massie Kluver on 08/31/2022 14:00:00 [name] ([id number]) [id number] Page 11 of 12 -------------------------------------------------------------------------------- Wound Assessment Details Patient Name: Date of Service: [name], [name]. 08/31/2022 1:30 PM Medical Record Number: [id number] Patient Account Number: [id number] Date of Birth/Sex: Treating RN: Nov 02, 1933 (88 y.o. Orvan Falconer Primary Care Timothey Dahlstrom: Fulton Reek Other Clinician: Massie Kluver Referring Chennel Olivos: Treating Stormee Duda/Extender: Maurilio Lovely Weeks in Treatment: 14 Wound Status Wound Number: 7 Primary Diabetic Wound/Ulcer of the Lower Extremity Etiology: Wound Location: Right, Distal, Medial Lower Leg Wound Open Wounding Event: Trauma Status: Date Acquired:  04/19/2022 Comorbid Congestive Heart Failure, Hypertension, Type II Diabetes, Weeks Of Treatment: 14 History: Received Radiation Clustered Wound: No Photos Wound Measurements Length: (cm) 0.4 Width: (cm) 1.3 Depth: (cm) 0.1 Area: (cm) 0.408 Volume: (cm) 0.041 % Reduction in Area: 95.8% % Reduction in Volume: 97.9% Tunneling: No Undermining: No Wound Description Classification: Grade 2 Exudate Amount: Medium Exudate Type: Serosanguineous Exudate Color: red, brown Treatment Notes Wound #7 (Lower Leg) Wound Laterality: Right, Medial, Distal Cleanser Byram Ancillary Kit - 15 Day Supply Discharge Instruction: Use supplies as instructed; Kit contains: (15) Saline Bullets; (15) 3x3 Gauze; 15 pr Gloves Soap and Water Discharge Instruction: Gently cleanse wound with antibacterial soap, rinse and pat dry prior to dressing wounds Wound Cleanser Discharge Instruction: Wash your hands with soap  and water. Remove old dressing, discard into plastic bag and place into trash. Cleanse the wound with Wound Cleanser prior to applying a clean dressing using gauze sponges, not tissues or cotton balls. Do not scrub or use excessive force. Pat dry using gauze sponges, not tissue or cotton balls. Peri-Wound Care Topical [name], [name] ([id number]) [id number] Page 12 of 12 Primary Dressing Silvercel 4 1/4x 4 1/4 (in/in) Discharge Instruction: Apply Silvercel 4 1/4x 4 1/4 (in/in) as instructed Secondary Dressing Gauze Discharge Instruction: As directed: dry, Secured With [facility] Weiss ape ape, 2x2 (in/yd) Compression Wrap Compression Stockings Add-Ons Electronic Signature(s) Signed: 08/31/2022 4:33:44 PM By: Massie Kluver Signed: 08/31/2022 4:40:57 PM By: Carlene Coria RN Entered By: Massie Kluver on 08/31/2022 14:00:32 -------------------------------------------------------------------------------- Vitals Details Patient  Name: Date of Service: [name], [name]. 08/31/2022 1:30 PM Medical Record Number: [id number] Patient Account Number: [id number] Date of Birth/Sex: Treating RN: [birth year] (88 y.o. Orvan Falconer Primary Care Yaseen Gilberg: Fulton Reek Other Clinician: Massie Kluver Referring Erendida Wrenn: Treating Kooper Godshall/Extender: Rupert Stacks in Treatment: 14 Vital Signs Time Taken: 13:40 Temperature (F): 98.3 Height (in): 64 Pulse (bpm): 73 Weight (lbs): 166 Respiratory Rate (breaths/min): 18 Body Mass Index (BMI): 28.5 Blood Pressure (mmHg): 134/60 Reference Range: 80 - 120 mg / dl Electronic Signature(s) Signed: 08/31/2022 4:33:44 PM By: Massie Kluver Entered By: Massie Kluver on 08/31/2022 13:43:19

## 2022-09-12 ENCOUNTER — Ambulatory Visit: Payer: Medicare Other | Admitting: Cardiovascular Disease

## 2022-09-12 ENCOUNTER — Ambulatory Visit: Payer: Medicare Other | Admitting: Internal Medicine

## 2022-09-14 ENCOUNTER — Encounter: Payer: Medicare Other | Attending: Physician Assistant | Admitting: Physician Assistant

## 2022-09-14 DIAGNOSIS — L97812 Non-pressure chronic ulcer of other part of right lower leg with fat layer exposed: Secondary | ICD-10-CM | POA: Diagnosis not present

## 2022-09-14 DIAGNOSIS — S81812A Laceration without foreign body, left lower leg, initial encounter: Secondary | ICD-10-CM | POA: Diagnosis not present

## 2022-09-14 DIAGNOSIS — I2510 Atherosclerotic heart disease of native coronary artery without angina pectoris: Secondary | ICD-10-CM | POA: Diagnosis not present

## 2022-09-14 DIAGNOSIS — I132 Hypertensive heart and chronic kidney disease with heart failure and with stage 5 chronic kidney disease, or end stage renal disease: Secondary | ICD-10-CM | POA: Diagnosis not present

## 2022-09-14 DIAGNOSIS — Z992 Dependence on renal dialysis: Secondary | ICD-10-CM | POA: Insufficient documentation

## 2022-09-14 DIAGNOSIS — L97822 Non-pressure chronic ulcer of other part of left lower leg with fat layer exposed: Secondary | ICD-10-CM | POA: Diagnosis not present

## 2022-09-14 DIAGNOSIS — I5040 Unspecified combined systolic (congestive) and diastolic (congestive) heart failure: Secondary | ICD-10-CM | POA: Diagnosis not present

## 2022-09-14 DIAGNOSIS — E11622 Type 2 diabetes mellitus with other skin ulcer: Secondary | ICD-10-CM | POA: Diagnosis not present

## 2022-09-14 DIAGNOSIS — N186 End stage renal disease: Secondary | ICD-10-CM | POA: Insufficient documentation

## 2022-09-14 DIAGNOSIS — I87333 Chronic venous hypertension (idiopathic) with ulcer and inflammation of bilateral lower extremity: Secondary | ICD-10-CM | POA: Insufficient documentation

## 2022-09-14 DIAGNOSIS — E1151 Type 2 diabetes mellitus with diabetic peripheral angiopathy without gangrene: Secondary | ICD-10-CM | POA: Insufficient documentation

## 2022-09-14 DIAGNOSIS — E1122 Type 2 diabetes mellitus with diabetic chronic kidney disease: Secondary | ICD-10-CM | POA: Insufficient documentation

## 2022-09-14 DIAGNOSIS — J449 Chronic obstructive pulmonary disease, unspecified: Secondary | ICD-10-CM | POA: Insufficient documentation

## 2022-09-14 NOTE — Progress Notes (Addendum)
ANNAGRACE, CARR Weiss ([id number]) [id number] Page 1 of 12 Visit Report for 09/14/2022 Arrival Information Details Patient Name: Date of Service: [name], [name] 09/14/2022 2:15 PM Medical Record Number: [id number] Patient Account Number: [id number] Date of Birth/Sex: Treating RN: [name] (88 y.o. [name] Primary Care Oaklie Durrett: Fulton Reek Other Clinician: Massie Kluver Referring Cassandre Oleksy: Treating Gailen Venne/Extender: Rupert Stacks in Treatment: 55 Visit Information History Since Last Visit All ordered tests and consults were completed: No Patient Arrived: Wheel Chair Added or deleted any medications: No Arrival Time: 14:39 Any new allergies or adverse reactions: No Transfer Assistance: EasyPivot Patient Lift Had a fall or experienced change in No Patient Identification Verified: Yes activities of daily living that may affect Secondary Verification Process Completed: Yes risk of falls: Patient Requires Transmission-Based Precautions: No Signs or symptoms of abuse/neglect since last visito No Patient Has Alerts: Yes Hospitalized since last visit: No Patient Alerts: Patient on Blood Thinner Implantable device outside of the clinic excluding No DIABETIC cellular tissue based products placed in the center ABI LEFT .79 02/14/22 since last visit: ABI RIGHT 1.02 02/14/22 Has Dressing in Place as Prescribed: Yes Pain Present Now: Yes Electronic Signature(s) Signed: 09/19/2022 4:44:06 PM By: Massie Kluver Entered By: Massie Kluver on 09/14/2022 14:45:55 -------------------------------------------------------------------------------- Clinic Level of Care Assessment Details Patient Name: Date of Service: [name], [name] 09/14/2022 2:15 PM Medical Record Number: [id number] Patient Account Number: [id number] Date of Birth/Sex: Treating RN: [birth year]Weiss (88 y.o. [name] Primary Care Maelani Yarbro: Fulton Reek Other Clinician: Massie Kluver Referring Haila Dena: Treating Brayli Klingbeil/Extender: Rupert Stacks in Treatment: 16 Clinic Level of Care Assessment Items TOOL 4 Quantity Score []  - 0 Use when only an EandM is performed on FOLLOW-UP visit ASSESSMENTS - Nursing Assessment / Reassessment X- 1 10 Reassessment of Co-morbidities (includes updates in patient status) X- 1 5 Reassessment of Adherence to Treatment Plan [name], [name] ([id number]) [id number] Page 2 of 12 ASSESSMENTS - Wound and Skin A ssessment / Reassessment []  - 0 Simple Wound Assessment / Reassessment - one wound X- 4 5 Complex Wound Assessment / Reassessment - multiple wounds []  - 0 Dermatologic / Skin Assessment (not related to wound area) ASSESSMENTS - Focused Assessment []  - 0 Circumferential Edema Measurements - multi extremities []  - 0 Nutritional Assessment / Counseling / Intervention []  - 0 Lower Extremity Assessment (monofilament, tuning fork, pulses) []  - 0 Peripheral Arterial Disease Assessment (using hand held doppler) ASSESSMENTS - Ostomy and/or Continence Assessment and Care []  - 0 Incontinence Assessment and Management []  - 0 Ostomy Care Assessment and Management (repouching, etc.) PROCESS - Coordination of Care X - Simple Patient / Family Education for ongoing care 1 15 []  - 0 Complex (extensive) Patient / Family Education for ongoing care []  - 0 Staff obtains Programmer, systems, Records, Weiss Results / Process Orders est []  - 0 Staff telephones HHA, Nursing Homes / Clarify orders / etc []  - 0 Routine Transfer to another Facility (non-emergent condition) []  - 0 Routine Hospital Admission (non-emergent condition) []  - 0 New Admissions / Biomedical engineer / Ordering NPWT Apligraf, etc. , []  - 0 Emergency Hospital Admission (emergent condition) X- 1 10 Simple Discharge Coordination []  - 0 Complex (extensive) Discharge Coordination PROCESS -  Special Needs []  - 0 Pediatric / Minor Patient Management []  - 0 Isolation Patient Management []  - 0 Hearing / Language / Visual special needs []  - 0 Assessment of Community assistance (transportation, D/C planning, etc.) []  - 0 Additional assistance /  Altered mentation []  - 0 Support Surface(s) Assessment (bed, cushion, seat, etc.) INTERVENTIONS - Wound Cleansing / Measurement []  - 0 Simple Wound Cleansing - one wound X- 4 5 Complex Wound Cleansing - multiple wounds X- 1 5 Wound Imaging (photographs - any number of wounds) []  - 0 Wound Tracing (instead of photographs) []  - 0 Simple Wound Measurement - one wound X- 4 5 Complex Wound Measurement - multiple wounds INTERVENTIONS - Wound Dressings []  - 0 Small Wound Dressing one or multiple wounds []  - 0 Medium Wound Dressing one or multiple wounds X- 4 20 Large Wound Dressing one or multiple wounds []  - 0 Application of Medications - topical []  - 0 Application of Medications - injection INTERVENTIONS - Miscellaneous []  - 0 External ear exam [name], [name] ([id number]) [id number] Page 3 of 12 []  - 0 Specimen Collection (cultures, biopsies, blood, body fluids, etc.) []  - 0 Specimen(s) / Culture(s) sent or taken to Lab for analysis []  - 0 Patient Transfer (multiple staff / Harrel Lemon Lift / Similar devices) []  - 0 Simple Staple / Suture removal (25 or less) []  - 0 Complex Staple / Suture removal (26 or more) []  - 0 Hypo / Hyperglycemic Management (close monitor of Blood Glucose) []  - 0 Ankle / Brachial Index (ABI) - do not check if billed separately X- 1 5 Vital Signs Has the patient been seen at the hospital within the last three years: Yes Total Score: 190 Level Of Care: New/Established - Level 5 Electronic Signature(s) Signed: 09/19/2022 4:44:06 PM By: Massie Kluver Entered By: Massie Kluver on 09/14/2022  15:10:19 -------------------------------------------------------------------------------- Encounter Discharge Information Details Patient Name: Date of Service: Sarah Shi Weiss. 09/14/2022 2:15 PM Medical Record Number: [id number] Patient Account Number: [id number] Date of Birth/Sex: Treating RN: [birth year] (88 y.o. [name] Primary Care Karel Mowers: Fulton Reek Other Clinician: Massie Kluver Referring Mattheu Brodersen: Treating Taegan Standage/Extender: Rupert Stacks in Treatment: 16 Encounter Discharge Information Items Discharge Condition: Stable Ambulatory Status: Wheelchair Discharge Destination: Home Transportation: Private Auto Accompanied By: husband Schedule Follow-up Appointment: Yes Clinical Summary of Care: Electronic Signature(s) Signed: 09/19/2022 4:44:06 PM By: Massie Kluver Entered By: Massie Kluver on 09/14/2022 17:30:08 Lower Extremity Assessment Details -------------------------------------------------------------------------------- [name] ([id number]) [id number] Page 4 of 12 Patient Name: Date of Service: [place], [name] 09/14/2022 2:15 PM Medical Record Number: [id number] Patient Account Number: [id number] Date of Birth/Sex: Treating RN: [birth year]Weiss (88 y.o. [name] Primary Care Cannon Quinton: Fulton Reek Other Clinician: Massie Kluver Referring Takeela Peil: Treating Kayl Stogdill/Extender: Maurilio Lovely Weeks in Treatment: 16 Edema Assessment Left: Right: Assessed: No No Edema: Calf Left: Right: Point of Measurement: 32 cm From Medial Instep Ankle Left: Right: Point of Measurement: 10 cm From Medial Instep Vascular Assessment Left: Right: Pulses: Dorsalis Pedis Palpable: Yes Yes Electronic Signature(s) Signed: 09/14/2022 5:27:53 PM By: Rosalio Loud MSN RN CNS WTA Signed: 09/19/2022 4:44:06 PM By: Massie Kluver Entered By: Massie Kluver on 09/14/2022  15:08:40 -------------------------------------------------------------------------------- Multi Wound Chart Details Patient Name: Date of Service: Sarah Shi Weiss. 09/14/2022 2:15 PM Medical Record Number: [id number] Patient Account Number: [id number] Date of Birth/Sex: Treating RN: [birth year]Weiss (88 y.o. [name] Primary Care Darneisha Windhorst: Fulton Reek Other Clinician: Massie Kluver Referring Clarie Camey: Treating Kris No/Extender: Maurilio Lovely Weeks in Treatment: 16 Vital Signs Height(in): 64 Pulse(bpm): 69 Weight(lbs): 166 Blood Pressure(mmHg): 155/87 Body Mass Index(BMI): 28.5 Temperature(F): 97.9 Respiratory Rate(breaths/min): 18 [10:Photos:] Right, Lateral Lower Leg Right, Proximal, Medial Lower Leg Left, Posterior Lower Leg Wound Location:  BARBEE, MAMULA Weiss ([id number]) [id number] Page 5 of 12 Gradually Appeared Pressure Injury Blister Wounding Event: Venous Leg Ulcer Diabetic Wound/Ulcer of the Lower Diabetic Wound/Ulcer of the Lower Primary Etiology: Extremity Extremity Congestive Heart Failure, Congestive Heart Failure, Congestive Heart Failure, Comorbid History: Hypertension, Type II Diabetes, Hypertension, Type II Diabetes, Hypertension, Type II Diabetes, Received Radiation Received Radiation Received Radiation 07/05/2022 08/17/2022 08/31/2022 Date Acquired: 10 4 0 Weeks of Treatment: Open Open Open Wound Status: No No No Wound Recurrence: 0.3x0.4x0.1 3x2.6x0.3 3.6x2.7x0.1 Measurements [name] x W x D (cm) 0.094 6.126 7.634 A (cm) : rea 0.009 1.838 0.763 Volume (cm) : 88.90% -47.40% N/A % Reduction in Area: 89.40% -342.90% N/A % Reduction in Volume: Full Thickness Without Exposed Grade 1 Grade 1 Classification: Support Structures Medium Medium Large Exudate Amount: Serous Serosanguineous Serous Exudate Type: amber red, brown amber Exudate Color: N/A Flat and Intact N/A Wound Margin: None Present  (0%) None Present (0%) None Present (0%) Granulation Amount: Large (67-100%) Small (1-33%) None Present (0%) Necrotic Amount: Fat Layer (Subcutaneous Tissue): Yes Fat Layer (Subcutaneous Tissue): Yes Fat Layer (Subcutaneous Tissue): Yes Exposed Structures: Fascia: No Fascia: No Fascia: No Tendon: No Tendon: No Tendon: No Muscle: No Muscle: No Muscle: No Joint: No Joint: No Joint: No Bone: No Bone: No Bone: No None None None Epithelialization: Wound Number: 7 N/A N/A Photos: N/A N/A Right, Distal, Medial Lower Leg N/A N/A Wound Location: Trauma N/A N/A Wounding Event: Diabetic Wound/Ulcer of the Lower N/A N/A Primary Etiology: Extremity Congestive Heart Failure, N/A N/A Comorbid History: Hypertension, Type II Diabetes, Received Radiation 04/19/2022 N/A N/A Date Acquired: 16 N/A N/A Weeks of Treatment: Open N/A N/A Wound Status: No N/A N/A Wound Recurrence: 0.5x0.8x0.2 N/A N/A Measurements [name] x W x D (cm) 0.314 N/A N/A A (cm) : rea 0.063 N/A N/A Volume (cm) : 96.80% N/A N/A % Reduction in A rea: 96.80% N/A N/A % Reduction in Volume: Grade 2 N/A N/A Classification: Medium N/A N/A Exudate A mount: Serosanguineous N/A N/A Exudate Type: red, brown N/A N/A Exudate Color: N/A N/A N/A Wound Margin: N/A N/A N/A Granulation A mount: N/A N/A N/A Necrotic A mount: N/A N/A N/A Exposed Structures: N/A N/A N/A Epithelialization: Treatment Notes Electronic Signature(s) Signed: 09/19/2022 4:44:06 PM By: Massie Kluver Entered By: Massie Kluver on 09/14/2022 15:08:45 [name] ([id number]) [id number] Page 6 of 12 -------------------------------------------------------------------------------- Multi-Disciplinary Care Plan Details Patient Name: Date of Service: [name], [name] 09/14/2022 2:15 PM Medical Record Number: [id number] Patient Account Number: [id number] Date of Birth/Sex: Treating RN: [birth year] (88 y.o.  [name] Primary Care Alohilani Levenhagen: Fulton Reek Other Clinician: Massie Kluver Referring Prestyn Stanco: Treating Khalik Pewitt/Extender: Rupert Stacks in Treatment: 16 Active Inactive Electronic Signature(s) Signed: 10/09/2022 10:04:25 AM By: Gretta Cool BSN, RN, CWS, Kim RN, BSN Signed: 11/01/2022 4:51:59 PM By: Rosalio Loud MSN RN CNS WTA Previous Signature: 10/09/2022 10:03:53 AM Version By: Gretta Cool BSN, RN, CWS, Kim RN, BSN Previous Signature: 09/15/2022 12:52:48 PM Version By: Rosalio Loud MSN RN CNS WTA Previous Signature: 09/19/2022 4:44:06 PM Version By: Massie Kluver Entered By: Gretta Cool BSN, RN, CWS, Kim on 10/09/2022 10:04:25 -------------------------------------------------------------------------------- Pain Assessment Details Patient Name: Date of Service: Sarah Shi Weiss. 09/14/2022 2:15 PM Medical Record Number: [id number] Patient Account Number: [id number] Date of Birth/Sex: Treating RN: [birth year] (88 y.o. [name] Primary Care Jaedyn Lard: Fulton Reek Other Clinician: Massie Kluver Referring Rosaelena Kemnitz: Treating Shenaya Lebo/Extender: Maurilio Lovely Weeks in Treatment: 16 Active Problems Location of Pain Severity and Description  of Pain Patient Has Paino Yes Site Locations Pain LocationJOURY, [name] ([id number]) [id number] Page 7 of 12 Pain Location: Generalized Pain, Pain in Ulcers Duration of the Pain. Constant / Intermittento Intermittent Rate the pain. Current Pain Level: 1 Worst Pain Level: 10 Least Pain Level: 2 Tolerable Pain Level: 3 Character of Pain Describe the Pain: Aching Pain Management and Medication Current Pain Management: Medication: Yes Cold Application: No Rest: Yes Massage: No Activity: No WeissE.N.S.: No Heat Application: No Leg drop or elevation: No Is the Current Pain Management Adequate: Inadequate How does your wound impact your activities of daily livingo Sleep:  No Bathing: No Appetite: No Relationship With Others: No Bladder Continence: No Emotions: No Bowel Continence: No Work: No Toileting: No Drive: No Dressing: No Hobbies: No Engineer, maintenance) Signed: 09/14/2022 5:27:53 PM By: Rosalio Loud MSN RN CNS WTA Signed: 09/19/2022 4:44:06 PM By: Massie Kluver Entered By: Massie Kluver on 09/14/2022 14:50:24 -------------------------------------------------------------------------------- Patient/Caregiver Education Details Patient Name: Date of Service: Ernestine Conrad 12/14/2023andnbsp2:15 PM Medical Record Number: [id number] Patient Account Number: [id number] Date of Birth/Gender: Treating RN: [birth year]Weiss (88 y.o. [name] Primary Care Physician: Fulton Reek Other Clinician: Massie Kluver Referring Physician: Treating Physician/Extender: Rupert Stacks in Treatment: 16 Education Assessment Education Provided To: Patient Education Topics Provided Wound/Skin Impairment: Handouts: Other: continue wound care as directed STARLETTE, THUROW Weiss ([id number]) [id number] Page 8 of 12 Methods: Explain/Verbal Responses: State content correctly Electronic Signature(s) Signed: 09/19/2022 4:44:06 PM By: Massie Kluver Entered By: Massie Kluver on 09/14/2022 17:29:28 -------------------------------------------------------------------------------- Wound Assessment Details Patient Name: Date of Service: Sarah Shi Weiss. 09/14/2022 2:15 PM Medical Record Number: [id number] Patient Account Number: [id number] Date of Birth/Sex: Treating RN: [birth year]Weiss (88 y.o. [name] Primary Care Zyanne Schumm: Fulton Reek Other Clinician: Massie Kluver Referring Maddux First: Treating Desean Heemstra/Extender: Maurilio Lovely Weeks in Treatment: 16 Wound Status Wound Number: 10 Primary Venous Leg Ulcer Etiology: Wound Location: Right, Lateral Lower Leg Wound Open Wounding  Event: Gradually Appeared Status: Date Acquired: 07/05/2022 Comorbid Congestive Heart Failure, Hypertension, Type II Diabetes, Weeks Of Treatment: 10 History: Received Radiation Clustered Wound: No Photos Wound Measurements Length: (cm) 0.3 Width: (cm) 0.4 Depth: (cm) 0.1 Area: (cm) 0.094 Volume: (cm) 0.009 % Reduction in Area: 88.9% % Reduction in Volume: 89.4% Epithelialization: None Wound Description Classification: Full Thickness Without Exposed Support Exudate Amount: Medium Exudate Type: Serous Exudate Color: amber Structures Foul Odor After Cleansing: No Slough/Fibrino Yes Wound Bed Granulation Amount: None Present (0%) Exposed Structure Necrotic Amount: Large (67-100%) Fascia Exposed: No Necrotic Quality: Adherent Slough Fat Layer (Subcutaneous Tissue) Exposed: Yes Tendon Exposed: No Muscle Exposed: No Joint Exposed: No [name], [name] ([id number]) [id number] Page 9 of 12 Bone Exposed: No Electronic Signature(s) Signed: 09/14/2022 5:27:53 PM By: Rosalio Loud MSN RN CNS WTA Signed: 09/19/2022 4:44:06 PM By: Massie Kluver Entered By: Massie Kluver on 09/14/2022 15:07:10 -------------------------------------------------------------------------------- Wound Assessment Details Patient Name: Date of Service: Sarah Shi Weiss. 09/14/2022 2:15 PM Medical Record Number: [id number] Patient Account Number: [id number] Date of Birth/Sex: Treating RN: [birth year]Weiss (88 y.o. [name] Primary Care Aseel Truxillo: Fulton Reek Other Clinician: Massie Kluver Referring Cyann Venti: Treating Ralphie Lovelady/Extender: Maurilio Lovely Weeks in Treatment: 16 Wound Status Wound Number: 11 Primary Diabetic Wound/Ulcer of the Lower Extremity Etiology: Wound Location: Right, Proximal, Medial Lower Leg Wound Open Wounding Event: Pressure Injury Status: Date Acquired: 08/17/2022 Comorbid Congestive Heart Failure, Hypertension, Type II  Diabetes, Weeks Of Treatment: 4 History: Received Radiation Clustered Wound: No  Photos Wound Measurements Length: (cm) 3 Width: (cm) 2.6 Depth: (cm) 0.3 Area: (cm) 6.126 Volume: (cm) 1.838 % Reduction in Area: -47.4% % Reduction in Volume: -342.9% Epithelialization: None Wound Description Classification: Grade 1 Wound Margin: Flat and Intact Exudate Amount: Medium Exudate Type: Serosanguineous Exudate Color: red, brown Foul Odor After Cleansing: No Slough/Fibrino Yes Wound Bed Granulation Amount: None Present (0%) Exposed Structure Necrotic Amount: Small (1-33%) Fascia Exposed: No Necrotic Quality: Adherent Slough Fat Layer (Subcutaneous Tissue) Exposed: Yes Tendon Exposed: No Muscle Exposed: No Joint Exposed: No Bone Exposed: No [name], [name] ([id number]) [id number] Page 10 of 12 Electronic Signature(s) Signed: 09/14/2022 5:27:53 PM By: Rosalio Loud MSN RN CNS WTA Signed: 09/19/2022 4:44:06 PM By: Massie Kluver Entered By: Massie Kluver on 09/14/2022 15:07:51 -------------------------------------------------------------------------------- Wound Assessment Details Patient Name: Date of Service: Sarah Shi Weiss. 09/14/2022 2:15 PM Medical Record Number: [id number] Patient Account Number: [id number] Date of Birth/Sex: Treating RN: [birth year]Weiss (88 y.o. [name] Primary Care Neasia Fleeman: Fulton Reek Other Clinician: Massie Kluver Referring Merideth Bosque: Treating Tam Delisle/Extender: Maurilio Lovely Weeks in Treatment: 16 Wound Status Wound Number: 12 Primary Diabetic Wound/Ulcer of the Lower Extremity Etiology: Wound Location: Left, Posterior Lower Leg Wound Open Wounding Event: Blister Status: Date Acquired: 08/31/2022 Comorbid Congestive Heart Failure, Hypertension, Type II Diabetes, Weeks Of Treatment: 0 History: Received Radiation Clustered Wound: No Photos Wound Measurements Length: (cm) 3.6 Width:  (cm) 2.7 Depth: (cm) 0.1 Area: (cm) 7.634 Volume: (cm) 0.763 % Reduction in Area: % Reduction in Volume: Epithelialization: None Tunneling: No Undermining: No Wound Description Classification: Grade 1 Exudate Amount: Large Exudate Type: Serous Exudate Color: amber Foul Odor After Cleansing: No Slough/Fibrino Yes Wound Bed Granulation Amount: None Present (0%) Exposed Structure Necrotic Amount: None Present (0%) Fascia Exposed: No Fat Layer (Subcutaneous Tissue) Exposed: Yes Tendon Exposed: No Muscle Exposed: No Joint Exposed: No Bone Exposed: No FATISHA, RABALAIS Weiss ([id number]) [id number] Page 11 of 12 Electronic Signature(s) Signed: 09/14/2022 5:27:53 PM By: Rosalio Loud MSN RN CNS WTA Signed: 09/19/2022 4:44:06 PM By: Massie Kluver Entered By: Massie Kluver on 09/14/2022 15:06:39 -------------------------------------------------------------------------------- Wound Assessment Details Patient Name: Date of Service: Sarah Shi Weiss. 09/14/2022 2:15 PM Medical Record Number: [id number] Patient Account Number: [id number] Date of Birth/Sex: Treating RN: [birth year] Weiss (88 y.o. [name] Primary Care Avarae Zwart: Fulton Reek Other Clinician: Massie Kluver Referring Mozelle Remlinger: Treating Juanito Gonyer/Extender: Maurilio Lovely Weeks in Treatment: 16 Wound Status Wound Number: 7 Primary Diabetic Wound/Ulcer of the Lower Extremity Etiology: Wound Location: Right, Distal, Medial Lower Leg Wound Open Wounding Event: Trauma Status: Date Acquired: 04/19/2022 Comorbid Congestive Heart Failure, Hypertension, Type II Diabetes, Weeks Of Treatment: 16 History: Received Radiation Clustered Wound: No Photos Wound Measurements Length: (cm) 0.5 Width: (cm) 0.8 Depth: (cm) 0.2 Area: (cm) 0.314 Volume: (cm) 0.063 % Reduction in Area: 96.8% % Reduction in Volume: 96.8% Wound Description Classification: Grade 2 Exudate Amount:  Medium Exudate Type: Serosanguineous Exudate Color: red, brown Electronic Signature(s) Signed: 09/14/2022 5:27:53 PM By: Rosalio Loud MSN RN CNS WTA Signed: 09/19/2022 4:44:06 PM By: Massie Kluver Entered By: Massie Kluver on 09/14/2022 15:08:20 [name] ([id number]) [id number] Page 12 of 12 -------------------------------------------------------------------------------- Vitals Details Patient Name: Date of Service: PEMBER, [name] 09/14/2022 2:15 PM Medical Record Number: [id number] Patient Account Number: [id number] Date of Birth/Sex: Treating RN: [birth year]Weiss (88 y.o. [name] Primary Care Shai Rasmussen: Fulton Reek Other Clinician: Massie Kluver Referring Ricka Westra: Treating Brenyn Petrey/Extender: Maurilio Lovely Weeks in Treatment: 16 Vital Signs Time Taken:  14:47 Temperature (F): 97.9 Height (in): 64 Pulse (bpm): 68 Weight (lbs): 166 Respiratory Rate (breaths/min): 18 Body Mass Index (BMI): 28.5 Blood Pressure (mmHg): 155/87 Reference Range: 80 - 120 mg / dl Electronic Signature(s) Signed: 09/19/2022 4:44:06 PM By: Massie Kluver Entered By: Massie Kluver on 09/14/2022 14:50:19

## 2022-09-14 NOTE — Progress Notes (Addendum)
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 large amount of serous drainage noted. There is no granulation within the wound bed. There is no necrotic tissue within the wound bed. Wound #7 status is Open. Original cause of wound was Trauma. The date acquired was: 04/19/2022. The wound  has been in treatment 16 weeks. The wound is located on the Right,Distal,Medial Lower Leg. The wound measures 0.5cm length x 0.8cm width x 0.2cm depth; 0.314cm^2 area and 0.063cm^3 volume. There is a medium amount of serosanguineous drainage noted. 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 Bathing/ Shower/ Hygiene: May shower with wound dressing protected with water repellent cover or cast protector. Anesthetic (Use 'Patient Medications' Section for Anesthetic Order Entry): Lidocaine applied to wound bed [name], [name] ([id number]) [id number] Page 10 of 11 Edema Control - Lymphedema / Segmental Compressive Device / Other: Patient to wear own compression stockings. Remove compression stockings every night before going to bed and put on every morning when getting up. - apply lotion to legs at night Elevate, Exercise Daily and Avoid Standing for Long Periods of Time. Elevate legs to the level of the heart and pump ankles as often as possible Elevate leg(s) parallel to the floor when sitting. WOUND #10: - Lower Leg Wound Laterality: Right, Lateral Cleanser: Byram Ancillary Kit - 15 Day Supply (Generic) 3 x Per Week/30 Days Discharge Instructions: Use supplies as instructed; Kit contains: (15) Saline Bullets; (15) 3x3 Gauze; 15 pr Gloves Cleanser: Soap and Water 3 x Per Week/30 Days Discharge Instructions: Gently cleanse wound with antibacterial soap, rinse  and pat dry prior to dressing  wounds Cleanser: Wound Cleanser 3 x Per Week/30 Days Discharge Instructions: Wash your hands with soap and water. Remove old dressing, discard into plastic bag and place into trash. Cleanse the wound with Wound Cleanser prior to applying a clean dressing using gauze sponges, not tissues or cotton balls. Do not scrub or use excessive force. Pat dry using gauze sponges, not tissue or cotton balls. Prim Dressing: Silvercel 4 1/4x 4 1/4 (in/in) (Generic) 3 x Per Week/30 Days ary Discharge Instructions: Apply Silvercel 4 1/4x 4 1/4 (in/in) as instructed Secondary Dressing: Gauze (Generic) 3 x Per Week/30 Days Discharge Instructions: As directed: dry, Secured With: Medipore Weiss - 102M Medipore H Soft Cloth Surgical Weiss ape ape, 2x2 (in/yd) 3 x Per Week/30 Days WOUND #11: - Lower Leg Wound Laterality: Right, Medial, Proximal Cleanser: Byram Ancillary Kit - 15 Day Supply (Generic) 3 x Per Week/30 Days Discharge Instructions: Use supplies as instructed; Kit contains: (15) Saline Bullets; (15) 3x3 Gauze; 15 pr Gloves Cleanser: Soap and Water 3 x Per Week/30 Days Discharge Instructions: Gently cleanse wound with antibacterial soap, rinse and pat dry prior to dressing wounds Cleanser: Wound Cleanser 3 x Per Week/30 Days Discharge Instructions: Wash your hands with soap and water. Remove old dressing, discard into plastic bag and place into trash. Cleanse the wound with Wound Cleanser prior to applying a clean dressing using gauze sponges, not tissues or cotton balls. Do not scrub or use excessive force. Pat dry using gauze sponges, not tissue or cotton balls. Prim Dressing: Silvercel 4 1/4x 4 1/4 (in/in) (DME) (Dispense As Written) 3 x Per Week/30 Days ary Discharge Instructions: Apply Silvercel 4 1/4x 4 1/4 (in/in) as instructed Secondary Dressing: Gauze (Generic) 3 x Per Week/30 Days Discharge Instructions: As directed: dry, Secured With: Medipore Weiss - 102M Medipore H Soft Cloth Surgical Weiss ape ape, 2x2  (in/yd) 3 x Per Week/30 Days WOUND #12: - Lower Leg Wound Laterality: Left, Posterior Cleanser: Byram Ancillary Kit - 15 Day Supply (Generic) 3 x Per Week/30 Days Discharge Instructions: Use supplies as instructed; Kit contains: (15) Saline Bullets; (15) 3x3 Gauze; 15 pr Gloves Cleanser: Soap and Water 3 x Per Week/30 Days Discharge Instructions: Gently cleanse wound with antibacterial soap, rinse and pat dry prior to dressing wounds Cleanser: Wound Cleanser 3 x Per Week/30 Days Discharge Instructions: Wash your hands with soap and water. Remove old dressing, discard into plastic bag and place into trash. Cleanse the wound with Wound Cleanser prior to applying a clean dressing using gauze sponges, not tissues or cotton balls. Do not scrub or use excessive force. Pat dry using gauze sponges, not tissue or cotton balls. Prim Dressing: Silvercel 4 1/4x 4 1/4 (in/in) (Generic) 3 x Per Week/30 Days ary Discharge Instructions: Apply Silvercel 4 1/4x 4 1/4 (in/in) as instructed Secondary Dressing: Gauze (Generic) 3 x Per Week/30 Days Discharge Instructions: As directed: dry, Secured With: Medipore Weiss - 102M Medipore H Soft Cloth Surgical Weiss ape ape, 2x2 (in/yd) 3 x Per Week/30 Days WOUND #7: - Lower Leg Wound Laterality: Right, Medial, Distal Cleanser: Byram Ancillary Kit - 15 Day Supply (Generic) 3 x Per Week/30 Days Discharge Instructions: Use supplies as instructed; Kit contains: (15) Saline Bullets; (15) 3x3 Gauze; 15 pr Gloves Cleanser: Soap and Water 3 x Per Week/30 Days Discharge Instructions: Gently cleanse wound with antibacterial soap, rinse and pat dry prior to dressing wounds Cleanser: Wound Cleanser 3 x Per Week/30 Days Discharge Instructions: Wash your hands  with soap and water. Remove old dressing, discard into plastic bag and place into trash. Cleanse the wound with Wound Cleanser prior to applying a clean dressing using gauze sponges, not tissues or cotton balls. Do not scrub or use  excessive force. Pat dry using gauze sponges, not tissue or cotton balls. Prim Dressing: Silvercel 4 1/4x 4 1/4 (in/in) (Generic) 3 x Per Week/30 Days ary Discharge Instructions: Apply Silvercel 4 1/4x 4 1/4 (in/in) as instructed Secondary Dressing: Gauze (Generic) 3 x Per Week/30 Days Discharge Instructions: As directed: dry, Secured With: Medipore Weiss - 19M Medipore H Soft Cloth Surgical Weiss ape ape, 2x2 (in/yd) 3 x Per Week/30 Days 1. I am going to recommend that we have the patient continue to monitor for any signs of infection or worsening. Obviously based on what we are seeing I do feel like that she is making really good progress here. 2. Abdomen suggest as well that she continue with the silver alginate dressings which I think are doing awesome for her. 3. She also continue with the ABD pad or gauze to cover and then subsequently she will be applying the tape to hold in place. She refuses to wear the Tubigrip or any other compression at this point. We will see patient back for reevaluation in 1 week here in the clinic. If anything worsens or changes patient will contact our office for additional recommendations. Electronic Signature(s) Signed: 09/14/2022 3:40:29 PM By: Worthy Keeler PA-C Entered By: Worthy Keeler on 09/14/2022 15:40:29 [name] ([id number]) [id number] Page 11 of 11 -------------------------------------------------------------------------------- SuperBill Details Patient Name: Date of Service: [name], [name] 09/14/2022 Medical Record Number: [id number] Patient Account Number: [id number] Date of Birth/Sex: Treating RN: [id number] (88 y.o. Drema Pry Primary Care Provider: Fulton Reek Other Clinician: Massie Kluver Referring Provider: Treating Provider/Extender: Maurilio Lovely Weeks in Treatment: 16 Diagnosis Coding ICD-10 Codes Code Description [phone] Chronic venous hypertension (idiopathic)  with ulcer and inflammation of bilateral lower extremity L97.812 Non-pressure chronic ulcer of other part of right lower leg with fat layer exposed L97.822 Non-pressure chronic ulcer of other part of left lower leg with fat layer exposed I10 Essential (primary) hypertension Z99.2 Dependence on renal dialysis I12.0 Hypertensive chronic kidney disease with stage 5 chronic kidney disease or end stage renal disease Facility Procedures : CPT4 Code: [id number] Description: [place] VISIT-LEV 5 EST PT Modifier: Quantity: 1 Physician Procedures : CPT4 Code Description Modifier [id number] - WC PHYS LEVEL 3 - EST PT ICD-10 Diagnosis Description I87.333 Chronic venous hypertension (idiopathic) with ulcer and inflammation of bilateral lower extremity L97.812 Non-pressure chronic ulcer of other  part of right lower leg with fat layer exposed L97.822 Non-pressure chronic ulcer of other part of left lower leg with fat layer exposed I10 Essential (primary) hypertension Quantity: 1 Electronic Signature(s) Signed: 09/14/2022 3:40:44 PM By: Worthy Keeler PA-C Entered By: Worthy Keeler on 09/14/2022 15:40:43

## 2022-10-05 ENCOUNTER — Emergency Department: Payer: Medicare Other

## 2022-10-05 ENCOUNTER — Other Ambulatory Visit: Payer: Self-pay

## 2022-10-05 ENCOUNTER — Inpatient Hospital Stay: Payer: Medicare Other

## 2022-10-05 ENCOUNTER — Inpatient Hospital Stay
Admission: EM | Admit: 2022-10-05 | Discharge: 2022-11-02 | DRG: 291 | Disposition: E | Payer: Medicare Other | Attending: Internal Medicine | Admitting: Internal Medicine

## 2022-10-05 ENCOUNTER — Ambulatory Visit: Payer: Medicare Other | Admitting: Physician Assistant

## 2022-10-05 DIAGNOSIS — R578 Other shock: Secondary | ICD-10-CM | POA: Diagnosis not present

## 2022-10-05 DIAGNOSIS — E1142 Type 2 diabetes mellitus with diabetic polyneuropathy: Secondary | ICD-10-CM | POA: Diagnosis present

## 2022-10-05 DIAGNOSIS — A09 Infectious gastroenteritis and colitis, unspecified: Secondary | ICD-10-CM | POA: Diagnosis present

## 2022-10-05 DIAGNOSIS — N186 End stage renal disease: Secondary | ICD-10-CM

## 2022-10-05 DIAGNOSIS — I5043 Acute on chronic combined systolic (congestive) and diastolic (congestive) heart failure: Secondary | ICD-10-CM | POA: Diagnosis present

## 2022-10-05 DIAGNOSIS — Z9981 Dependence on supplemental oxygen: Secondary | ICD-10-CM

## 2022-10-05 DIAGNOSIS — E1122 Type 2 diabetes mellitus with diabetic chronic kidney disease: Secondary | ICD-10-CM | POA: Diagnosis present

## 2022-10-05 DIAGNOSIS — I639 Cerebral infarction, unspecified: Secondary | ICD-10-CM | POA: Diagnosis present

## 2022-10-05 DIAGNOSIS — I2510 Atherosclerotic heart disease of native coronary artery without angina pectoris: Secondary | ICD-10-CM | POA: Diagnosis present

## 2022-10-05 DIAGNOSIS — J9602 Acute respiratory failure with hypercapnia: Secondary | ICD-10-CM | POA: Diagnosis present

## 2022-10-05 DIAGNOSIS — Z79899 Other long term (current) drug therapy: Secondary | ICD-10-CM

## 2022-10-05 DIAGNOSIS — K5730 Diverticulosis of large intestine without perforation or abscess without bleeding: Secondary | ICD-10-CM | POA: Diagnosis present

## 2022-10-05 DIAGNOSIS — S81801A Unspecified open wound, right lower leg, initial encounter: Secondary | ICD-10-CM | POA: Diagnosis present

## 2022-10-05 DIAGNOSIS — J810 Acute pulmonary edema: Secondary | ICD-10-CM | POA: Diagnosis present

## 2022-10-05 DIAGNOSIS — R188 Other ascites: Secondary | ICD-10-CM | POA: Diagnosis present

## 2022-10-05 DIAGNOSIS — Z853 Personal history of malignant neoplasm of breast: Secondary | ICD-10-CM

## 2022-10-05 DIAGNOSIS — E8729 Other acidosis: Secondary | ICD-10-CM | POA: Diagnosis present

## 2022-10-05 DIAGNOSIS — Z91041 Radiographic dye allergy status: Secondary | ICD-10-CM

## 2022-10-05 DIAGNOSIS — Z888 Allergy status to other drugs, medicaments and biological substances status: Secondary | ICD-10-CM

## 2022-10-05 DIAGNOSIS — Z83438 Family history of other disorder of lipoprotein metabolism and other lipidemia: Secondary | ICD-10-CM

## 2022-10-05 DIAGNOSIS — E785 Hyperlipidemia, unspecified: Secondary | ICD-10-CM | POA: Diagnosis present

## 2022-10-05 DIAGNOSIS — E039 Hypothyroidism, unspecified: Secondary | ICD-10-CM | POA: Diagnosis present

## 2022-10-05 DIAGNOSIS — R579 Shock, unspecified: Secondary | ICD-10-CM | POA: Diagnosis not present

## 2022-10-05 DIAGNOSIS — J9601 Acute respiratory failure with hypoxia: Secondary | ICD-10-CM | POA: Diagnosis present

## 2022-10-05 DIAGNOSIS — Z992 Dependence on renal dialysis: Secondary | ICD-10-CM

## 2022-10-05 DIAGNOSIS — I10 Essential (primary) hypertension: Secondary | ICD-10-CM | POA: Diagnosis present

## 2022-10-05 DIAGNOSIS — Z886 Allergy status to analgesic agent status: Secondary | ICD-10-CM

## 2022-10-05 DIAGNOSIS — R570 Cardiogenic shock: Secondary | ICD-10-CM | POA: Diagnosis present

## 2022-10-05 DIAGNOSIS — Z1152 Encounter for screening for COVID-19: Secondary | ICD-10-CM

## 2022-10-05 DIAGNOSIS — Z79890 Hormone replacement therapy (postmenopausal): Secondary | ICD-10-CM

## 2022-10-05 DIAGNOSIS — Z8673 Personal history of transient ischemic attack (TIA), and cerebral infarction without residual deficits: Secondary | ICD-10-CM

## 2022-10-05 DIAGNOSIS — G43909 Migraine, unspecified, not intractable, without status migrainosus: Secondary | ICD-10-CM | POA: Diagnosis present

## 2022-10-05 DIAGNOSIS — R000 Tachycardia, unspecified: Secondary | ICD-10-CM | POA: Diagnosis present

## 2022-10-05 DIAGNOSIS — Z803 Family history of malignant neoplasm of breast: Secondary | ICD-10-CM

## 2022-10-05 DIAGNOSIS — E8770 Fluid overload, unspecified: Secondary | ICD-10-CM | POA: Diagnosis present

## 2022-10-05 DIAGNOSIS — Z66 Do not resuscitate: Secondary | ICD-10-CM | POA: Diagnosis present

## 2022-10-05 DIAGNOSIS — K219 Gastro-esophageal reflux disease without esophagitis: Secondary | ICD-10-CM | POA: Diagnosis present

## 2022-10-05 DIAGNOSIS — I493 Ventricular premature depolarization: Secondary | ICD-10-CM | POA: Diagnosis present

## 2022-10-05 DIAGNOSIS — Z87891 Personal history of nicotine dependence: Secondary | ICD-10-CM

## 2022-10-05 DIAGNOSIS — I132 Hypertensive heart and chronic kidney disease with heart failure and with stage 5 chronic kidney disease, or end stage renal disease: Secondary | ICD-10-CM | POA: Diagnosis present

## 2022-10-05 DIAGNOSIS — Z951 Presence of aortocoronary bypass graft: Secondary | ICD-10-CM

## 2022-10-05 DIAGNOSIS — R0902 Hypoxemia: Secondary | ICD-10-CM

## 2022-10-05 DIAGNOSIS — Z923 Personal history of irradiation: Secondary | ICD-10-CM

## 2022-10-05 DIAGNOSIS — E119 Type 2 diabetes mellitus without complications: Secondary | ICD-10-CM

## 2022-10-05 DIAGNOSIS — Z8249 Family history of ischemic heart disease and other diseases of the circulatory system: Secondary | ICD-10-CM

## 2022-10-05 DIAGNOSIS — Z7902 Long term (current) use of antithrombotics/antiplatelets: Secondary | ICD-10-CM

## 2022-10-05 DIAGNOSIS — R197 Diarrhea, unspecified: Principal | ICD-10-CM

## 2022-10-05 DIAGNOSIS — J811 Chronic pulmonary edema: Secondary | ICD-10-CM | POA: Diagnosis present

## 2022-10-05 DIAGNOSIS — R531 Weakness: Secondary | ICD-10-CM | POA: Diagnosis present

## 2022-10-05 LAB — COMPREHENSIVE METABOLIC PANEL
ALT: 11 U/L (ref 0–44)
AST: 18 U/L (ref 15–41)
Albumin: 2.8 g/dL — ABNORMAL LOW (ref 3.5–5.0)
Alkaline Phosphatase: 80 U/L (ref 38–126)
Anion gap: 14 (ref 5–15)
BUN: 50 mg/dL — ABNORMAL HIGH (ref 8–23)
CO2: 27 mmol/L (ref 22–32)
Calcium: 7.5 mg/dL — ABNORMAL LOW (ref 8.9–10.3)
Chloride: 96 mmol/L — ABNORMAL LOW (ref 98–111)
Creatinine, Ser: 4.94 mg/dL — ABNORMAL HIGH (ref 0.44–1.00)
GFR, Estimated: 8 mL/min — ABNORMAL LOW (ref >60)
Glucose, Bld: 97 mg/dL (ref 70–99)
Potassium: 4.6 mmol/L (ref 3.5–5.1)
Sodium: 137 mmol/L (ref 135–145)
Total Bilirubin: 0.8 mg/dL (ref 0.3–1.2)
Total Protein: 7.9 g/dL (ref 6.5–8.1)

## 2022-10-05 LAB — CBC WITH DIFFERENTIAL/PLATELET
Abs Immature Granulocytes: 0.01 10*3/uL (ref 0.00–0.07)
Basophils Absolute: 0 10*3/uL (ref 0.0–0.1)
Basophils Relative: 0 %
Eosinophils Absolute: 0 10*3/uL (ref 0.0–0.5)
Eosinophils Relative: 0 %
HCT: 34 % — ABNORMAL LOW (ref 36.0–46.0)
Hemoglobin: 10.3 g/dL — ABNORMAL LOW (ref 12.0–15.0)
Immature Granulocytes: 0 %
Lymphocytes Relative: 3 %
Lymphs Abs: 0.3 10*3/uL — ABNORMAL LOW (ref 0.7–4.0)
MCH: 29.9 pg (ref 26.0–34.0)
MCHC: 30.3 g/dL (ref 30.0–36.0)
MCV: 98.8 fL (ref 80.0–100.0)
Monocytes Absolute: 0.1 10*3/uL (ref 0.1–1.0)
Monocytes Relative: 2 %
Neutro Abs: 7.6 10*3/uL (ref 1.7–7.7)
Neutrophils Relative %: 95 %
Platelets: 153 10*3/uL (ref 150–400)
RBC: 3.44 MIL/uL — ABNORMAL LOW (ref 3.87–5.11)
RDW: 15.9 % — ABNORMAL HIGH (ref 11.5–15.5)
WBC: 8 10*3/uL (ref 4.0–10.5)
nRBC: 0 % (ref 0.0–0.2)

## 2022-10-05 LAB — RESP PANEL BY RT-PCR (RSV, FLU A&B, COVID)  RVPGX2
Influenza A by PCR: NEGATIVE
Influenza B by PCR: NEGATIVE
Resp Syncytial Virus by PCR: NEGATIVE
SARS Coronavirus 2 by RT PCR: NEGATIVE

## 2022-10-05 LAB — BLOOD GAS, ARTERIAL
Acid-base deficit: 2.8 mmol/L — ABNORMAL HIGH (ref 0.0–2.0)
Bicarbonate: 25.1 mmol/L (ref 20.0–28.0)
O2 Saturation: 99.7 %
Patient temperature: 37
pCO2 arterial: 56 mmHg — ABNORMAL HIGH (ref 32–48)
pH, Arterial: 7.26 — ABNORMAL LOW (ref 7.35–7.45)
pO2, Arterial: 172 mmHg — ABNORMAL HIGH (ref 83–108)

## 2022-10-05 LAB — MAGNESIUM: Magnesium: 1.5 mg/dL — ABNORMAL LOW (ref 1.7–2.4)

## 2022-10-05 LAB — BRAIN NATRIURETIC PEPTIDE: B Natriuretic Peptide: 3495.5 pg/mL — ABNORMAL HIGH (ref 0.0–100.0)

## 2022-10-05 LAB — CBG MONITORING, ED: Glucose-Capillary: 51 mg/dL — ABNORMAL LOW (ref 70–99)

## 2022-10-05 LAB — TROPONIN I (HIGH SENSITIVITY): Troponin I (High Sensitivity): 31 ng/L — ABNORMAL HIGH (ref <18)

## 2022-10-05 MED ORDER — VASOPRESSIN 20 UNITS/100 ML INFUSION FOR SHOCK
0.0000 [IU]/min | INTRAVENOUS | Status: DC
  Administered 2022-10-05: 0.04 [IU]/min via INTRAVENOUS
  Filled 2022-10-05: qty 100

## 2022-10-05 MED ORDER — NITROGLYCERIN 2 % TD OINT: 0.5000 [in_us] | TOPICAL_OINTMENT | Freq: Once | TRANSDERMAL | Status: DC

## 2022-10-05 MED ORDER — LOPERAMIDE HCL 2 MG PO CAPS
4.0000 mg | ORAL_CAPSULE | Freq: Once | ORAL | Status: AC
  Administered 2022-10-05: 4 mg via ORAL
  Filled 2022-10-05: qty 2

## 2022-10-05 MED ORDER — NOREPINEPHRINE 4 MG/250ML-% IV SOLN: 0.0000 ug/min | INTRAVENOUS | Status: DC

## 2022-10-05 MED ORDER — PHENYLEPHRINE 80 MCG/ML (10ML) SYRINGE FOR IV PUSH (FOR BLOOD PRESSURE SUPPORT)
PREFILLED_SYRINGE | INTRAVENOUS | Status: AC
  Filled 2022-10-05: qty 10

## 2022-10-05 MED ORDER — SODIUM CHLORIDE 0.9 % IV BOLUS
500.0000 mL | Freq: Once | INTRAVENOUS | Status: AC
  Administered 2022-10-05: 500 mL via INTRAVENOUS

## 2022-10-05 MED ORDER — FUROSEMIDE 10 MG/ML IJ SOLN
80.0000 mg | Freq: Once | INTRAMUSCULAR | Status: AC
  Administered 2022-10-05: 80 mg via INTRAVENOUS
  Filled 2022-10-05: qty 8

## 2022-10-05 MED ORDER — DEXTROSE 50 % IV SOLN
INTRAVENOUS | Status: AC
  Filled 2022-10-05: qty 50

## 2022-10-05 MED ORDER — NOREPINEPHRINE 4 MG/250ML-% IV SOLN
INTRAVENOUS | Status: AC
  Administered 2022-10-05: 2 ug/min via INTRAVENOUS
  Filled 2022-10-05: qty 250

## 2022-10-05 MED ORDER — LEVOTHYROXINE SODIUM 50 MCG PO TABS: 225.0000 ug | ORAL_TABLET | Freq: Every day | ORAL | Status: DC

## 2022-10-05 MED ORDER — PROCHLORPERAZINE EDISYLATE 10 MG/2ML IJ SOLN: 5.0000 mg | INTRAMUSCULAR | Status: DC | PRN

## 2022-10-05 MED ORDER — DEXTROSE 50 % IV SOLN
1.0000 | Freq: Once | INTRAVENOUS | Status: AC
  Administered 2022-10-05: 50 mL via INTRAVENOUS

## 2022-10-05 MED ORDER — IPRATROPIUM-ALBUTEROL 0.5-2.5 (3) MG/3ML IN SOLN
3.0000 mL | Freq: Once | RESPIRATORY_TRACT | Status: AC
  Administered 2022-10-05: 3 mL via RESPIRATORY_TRACT
  Filled 2022-10-05: qty 3

## 2022-10-05 MED ORDER — CLOPIDOGREL BISULFATE 75 MG PO TABS: 75.0000 mg | ORAL_TABLET | Freq: Every day | ORAL | Status: DC

## 2022-10-05 MED ORDER — NOREPINEPHRINE 16 MG/250ML-% IV SOLN
0.0000 ug/min | INTRAVENOUS | Status: DC
  Administered 2022-10-05: 40 ug/min via INTRAVENOUS
  Filled 2022-10-05: qty 250

## 2022-10-05 MED ORDER — CHLORHEXIDINE GLUCONATE CLOTH 2 % EX PADS
6.0000 | MEDICATED_PAD | Freq: Every day | CUTANEOUS | Status: DC
  Filled 2022-10-05: qty 6

## 2022-10-05 MED ORDER — MAGNESIUM SULFATE 2 GM/50ML IV SOLN
2.0000 g | Freq: Once | INTRAVENOUS | Status: AC
  Administered 2022-10-05: 2 g via INTRAVENOUS
  Filled 2022-10-05: qty 50

## 2022-10-05 MED ORDER — SODIUM CHLORIDE 0.9 % IV SOLN
250.0000 mL | INTRAVENOUS | Status: DC
  Administered 2022-10-05: 250 mL via INTRAVENOUS

## 2022-10-05 MED ORDER — DEXTROSE 10 % IV SOLN: INTRAVENOUS | Status: DC

## 2022-10-05 MED ORDER — ALBUMIN HUMAN 25 % IV SOLN
50.0000 g | Freq: Once | INTRAVENOUS | Status: AC
  Administered 2022-10-05: 50 g via INTRAVENOUS
  Filled 2022-10-05: qty 200

## 2022-10-05 MED ORDER — GABAPENTIN 100 MG PO CAPS: 100.0000 mg | ORAL_CAPSULE | Freq: Two times a day (BID) | ORAL | Status: DC

## 2022-10-05 MED ORDER — SODIUM CHLORIDE 0.9 % IV BOLUS
1000.0000 mL | Freq: Once | INTRAVENOUS | Status: AC
  Administered 2022-10-05: 1000 mL via INTRAVENOUS

## 2022-10-05 MED ORDER — LOPERAMIDE HCL 2 MG PO CAPS: 2.0000 mg | ORAL_CAPSULE | ORAL | Status: DC | PRN

## 2022-10-05 NOTE — ED Notes (Signed)
Message sent to Dr Olevia Bowens for concerns over pt BP

## 2022-10-05 NOTE — ED Notes (Signed)
Pt expired 2216

## 2022-10-05 NOTE — ED Notes (Addendum)
Attempted to ambulate pt with walker. Minimal effort by pt, pt husband stating multiple times "she can't do it, she can't do it". Pt endorsing she is "too weak" to get up". Pt assisted back into bed, provider notified. WCTM.

## 2022-10-05 NOTE — Progress Notes (Signed)
[facility] admitting provider addendum:  I spoke to the patient's spouse Bud Scullion at [phone] and updated him about the patient's current condition requiring BiPAP, pressors and hemodialysis.  He voiced understanding.  He is unable to come to the hospital at the moment as he does not drive at night.  Tennis Must, MD

## 2022-10-05 NOTE — ED Notes (Signed)
Pt placed on 4L [place] at this time

## 2022-10-05 NOTE — H&P (Addendum)
History and Physical    Patient: [name] DOB: [birth year] DOA: 10/03/2022 DOS: the patient was seen and examined on 10/12/2022 PCP: Idelle Crouch, MD  Patient coming from: Home  Chief Complaint:  Chief Complaint  Patient presents with   Weakness   HPI: [name] is a 89 y.o. female with medical history significant of osteoarthritis, bilateral macular degeneration, right breast cancer, CAD, type 2 diabetes, diastolic dysfunction, gastritis, migraine headaches, seasonal allergies, hyperlipidemia, hypertension, hypothyroidism, peripheral neuropathy history of CVA, vertigo, ESRD on hemodialysis who presented to the emergency department with weakness in the setting of diarrheal illness.  She received about 1300 mL of IV fluids and became dyspneic.  She is tachycardic in the low 100s, tachypneic in the 30s, on BiPAP but O2 sat has been 100%.  No chest pain, dizziness, diaphoresis or palpitations.  She is unable to elaborate due to dyspnea.  ED course: Initial vital signs in a.m. were temperature 98.7 F, pulse 87, respiration 20, BP 117/49 mmHg O2 sat 96% on nasal cannula oxygen.  She received morphine 4 mg p.o., magnesium sulfate 2 g IVPB and IVF boluses as earlier mentioned.  Around 1700, the patient became tachycardic, tachypneic and hypoxic.  She was placed on BiPAP.  I ordered an half an inch Nitropaste with albumin, but before these were given she became hypotensive.  Norepinephrine infusion started.  Blood pressure improved.  PCCM consulted.  Lab work: Coronavirus and influenza PCR negative.  CBC showed a white count of 8.0, hemoglobin 10.3 g/dL platelets 153.  Magnesium was 1.5 mg deciliter.  CMP showed a chloride of 96 mmol/L, BUN 50, creatinine 4.94 and calcium 7.5 mg/deciliter.  Albumin was 2.8 g/dL.  The rest of the CMP measurements were normal.  Troponin 1 level was 31 mg/L.  BNP is still pending.  Arterial blood gas showed pH of 7.26, pCO2 56 and pO2 of 172  mmHg.  Acid-base deficit is 2.8 mmol/L.  Imaging: CT abdomen/pelvis showed anasarca with diffuse soft tissue edema, trace pleural effusion and moderate ascites.  No definite abdominopelvic finding.  Diffuse distal colonic diverticulosis without diverticulitis.  Bilateral renal atrophy.  Probable gallbladder sludge without cholecystitis or biliary dilation.  Aortic atherosclerosis.  Portable 1 view chest radiograph with cardiomegaly with diffuse interstitial and groundglass opacity suspicious for pulmonary edema.  Small right pleural effusion.   Review of Systems: As mentioned in the history of present illness. All other systems reviewed and are negative. Past Medical History:  Diagnosis Date   Arthritis    Bilateral Macular degeneration    Breast cancer ([place]) 2012   right breast   Breast mass, right    CAD S/P CABG x 4    a. 09/2012 CABG x 4: LIMA to LAD, SVG to Diag, SVG to OM1, SVG to PDA, EVH from bilateral thighs   Cancer ([place])    breast cancer, right side [id number]   Complication of anesthesia    Coronary artery disease 08/22/2012   sees Dr Rockey Situ   Diabetes [facility])    Diastolic dysfunction    a. 06/2020 Echo: EF 60-65%; b. 08/2020 Echo: EF 60-65%, no rwma, Gr1 DD. Nl RV size/fxn.   DOE (dyspnea on exertion)    ESRD (end stage renal disease) ([place])    Gastritis    hx of   GERD (gastroesophageal reflux disease)    Headache(784.0)    migraines   History of breast cancer    39 treatments of radiation. Negative chemo.  History of seasonal allergies    Hyperlipemia    Hypertension    sees Dr. Fulton Reek   Hypothyroidism    Neuropathy    Personal history of radiation therapy    Pneumonia    hx of   PONV (postoperative nausea and vomiting)    Stroke [facility])    2012   Vertigo    Past Surgical History:  Procedure Laterality Date   A/V FISTULAGRAM Left 01/13/2021   Procedure: A/V FISTULAGRAM;  Surgeon: Algernon Huxley, MD;  Location: [place] [facility];  Service: Cardiovascular;   Laterality: Left;   A/V SHUNT INTERVENTION Left 05/23/2021   Procedure: A/V SHUNT INTERVENTION;  Surgeon: Algernon Huxley, MD;  Location: [place] [facility];  Service: Cardiovascular;  Laterality: Left;   ABDOMINAL HYSTERECTOMY     APPENDECTOMY     AV FISTULA PLACEMENT Left 11/04/2020   Procedure: ARTERIOVENOUS (AV) FISTULA CREATION (BRACHIOCEPHALIC);  Surgeon: Algernon Huxley, MD;  Location: [facility] ORS;  Service: Vascular;  Laterality: Left;   BACK SURGERY     BREAST BIOPSY Right    2012 positive- [facility]   BREAST LUMPECTOMY Right 2012   F/U radiation    BREAST SURGERY     CARDIAC CATHETERIZATION  2014   CAROTID PTA/STENT INTERVENTION Left 06/14/2020   Procedure: CAROTID PTA/STENT INTERVENTION;  Surgeon: Algernon Huxley, MD;  Location: [place] [facility];  Service: Cardiovascular;  Laterality: Left;   CATARACT EXTRACTION W/PHACO Right 08/14/2017   Procedure: CATARACT EXTRACTION PHACO AND INTRAOCULAR LENS PLACEMENT (IOC);  Surgeon: Birder Robson, MD;  Location: [facility] ORS;  Service: Ophthalmology;  Laterality: Right;  [place] 00:43.0 AP% 17.1 CDE 7.37 Fluid Pack lot # [id number]   CATARACT EXTRACTION W/PHACO Left 10/09/2017   Procedure: CATARACT EXTRACTION PHACO AND INTRAOCULAR LENS PLACEMENT (IOC);  Surgeon: Birder Robson, MD;  Location: [facility] OR[facility];  Service: Ophthalmology;  Laterality: Left;  [place] 00:30.3 AP% 11.0 CDE 3.33 Fluid Pack Lot # [id number]   CORONARY ARTERY BYPASS GRAFT  09/05/2012   Procedure: CORONARY ARTERY BYPASS GRAFTING (CABG);  Surgeon: Rexene Alberts, MD;  Location: [place];  Service: Open Heart Surgery;  Laterality: N/A;  CABG x four, using left internal mammary artery and bilateral greater saphenous vein harvested endoscopically   DIALYSIS/PERMA CATHETER INSERTION Left 06/25/2020   Procedure: DIALYSIS/PERMA CATHETER INSERTION;  Surgeon: Algernon Huxley, MD;  Location: [place] [facility];  Service: Cardiovascular;  Laterality: Left;   DIALYSIS/PERMA CATHETER INSERTION N/A 08/10/2020    Procedure: DIALYSIS/PERMA CATHETER INSERTION;  Surgeon: Katha Cabal, MD;  Location: [place] [facility];  Service: Cardiovascular;  Laterality: N/A;   DIALYSIS/PERMA CATHETER INSERTION N/A 05/23/2021   Procedure: DIALYSIS/PERMA CATHETER INSERTION;  Surgeon: Algernon Huxley, MD;  Location: [place] [facility];  Service: Cardiovascular;  Laterality: N/A;   DIALYSIS/PERMA CATHETER REMOVAL N/A 08/05/2020   Procedure: DIALYSIS/PERMA CATHETER REMOVAL;  Surgeon: Algernon Huxley, MD;  Location: [place] [facility];  Service: Cardiovascular;  Laterality: N/A;   INTRAOPERATIVE TRANSESOPHAGEAL ECHOCARDIOGRAM  09/05/2012   Procedure: INTRAOPERATIVE TRANSESOPHAGEAL ECHOCARDIOGRAM;  Surgeon: Rexene Alberts, MD;  Location: [place];  Service: Open Heart Surgery;  Laterality: N/A;   KNEE SURGERY     Bilateral nerve block   LAPAROSCOPIC NISSEN FUNDOPLICATION     OVARIAN CYST REMOVAL     Social History:  reports that she quit smoking about 33 years ago. Her smoking use included cigarettes. She has never used smokeless tobacco. She reports that she does not drink alcohol  and does not use drugs.  Allergies  Allergen Reactions   Clonidine Other (See Comments) and Shortness Of Breath    Other reaction(s): Other (see comments) Other reaction(s): Other (See Comments)   Darvocet [Propoxyphene N-Acetaminophen] Anaphylaxis   Hydralazine     Other reaction(s): Other (See Comments) glomerulonephritis   Hydralazine Hcl Other (See Comments)    glomerulonephritis   Metoprolol Shortness Of Breath    Pt reports "very winded, couldn't catch breath"    Esomeprazole     Other reaction(s): Unknown   Guaifenesin     Other reaction(s): Unknown Other reaction(s): Unknown Other reaction(s): Unknown Other reaction(s): Unknown   Phenylephrine-Guaifenesin     Other reaction(s): Unknown   Rabeprazole     Other reaction(s): Unknown   Telithromycin     Other reaction(s): Unknown Other reaction(s): Unknown Other reaction(s):  Unknown   Fluocinolone Other (See Comments)    Feels crazy Other reaction(s): Other (see comments) Other reaction(s): Other (See Comments) Feels crazy Feels crazy   Iodinated Contrast Media Nausea And Vomiting and Other (See Comments)    Burning Other reaction(s): Other (see comments) Other reaction(s): Other (See Comments) Burning Nausea & vomiting   Levaquin [Levofloxacin] Nausea Only   Statins Other (See Comments)    Myalgia Other reaction(s): Other (See Comments), Unknown, Unknown Myalgia    Family History  Problem Relation Age of Onset   Heart disease Brother        CABG & stents   Hyperlipidemia Brother    Hypertension Brother    Cancer Father    Heart disease Mother    Breast cancer Cousin    Kidney disease Neg Hx     Prior to Admission medications   Medication Sig Start Date End Date Taking? Authorizing Provider  metoprolol tartrate (LOPRESSOR) 50 MG tablet Take 50 mg by mouth 3 (three) times daily. 08/21/22  Yes [provider]  acetaminophen (TYLENOL) 325 MG tablet Take 2 tablets (650 mg total) by mouth every 6 (six) hours as needed for headache. 06/20/21   Loletha Grayer, MD  ALPRAZolam Duanne Moron) 0.5 MG tablet Take 1 tablet (0.5 mg total) by mouth 2 (two) times daily as needed for anxiety. 06/20/21   Loletha Grayer, MD  calcium-vitamin D (OSCAL WITH D) 500-200 MG-UNIT tablet Take 1 tablet by mouth daily.    [provider]  clopidogrel (PLAVIX) 75 MG tablet Take 75 mg by mouth daily.    [provider]  Cyanocobalamin 1000 MCG SUBL Take 1,000 mcg by mouth daily.     [provider]  famotidine (PEPCID) 10 MG tablet Take 1 tablet (10 mg total) by mouth daily. 06/20/21   Loletha Grayer, MD  gabapentin (NEURONTIN) 100 MG capsule Take 1 capsule by mouth 2 (two) times daily. 08/01/21   [provider]  HYDROcodone-acetaminophen (NORCO/VICODIN) 5-325 MG tablet Take 1 tablet by mouth every 6 (six) hours as needed for moderate  pain. 06/20/21   Loletha Grayer, MD  levothyroxine (SYNTHROID) 150 MCG tablet Take 225 mcg by mouth daily before breakfast. 04/19/21   [provider]  ondansetron (ZOFRAN ODT) 4 MG disintegrating tablet Take 1 tablet (4 mg total) by mouth every 8 (eight) hours as needed for nausea or vomiting. 05/23/22   Minna Merritts, MD  OXYGEN Inhale 2 L into the lungs daily.    [provider]  torsemide (DEMADEX) 100 MG tablet Take 1 tablet (100 mg total) by mouth 3 (three) times a week. Tuesday, Thursday, & Saturday (non dialysis  days) 06/06/22   Minna Merritts, MD    Physical Exam: Vitals:   10/24/2022 1727 10/04/2022 1730 10/14/2022 1735 10/13/2022 1750  BP: (!) 104/40 (!) 115/52 (!) 126/42 (!) 109/41  Pulse: (!) 102 (!) 112 (!) 109 (!) 106  Resp: (!) 25 (!) 26 (!) 37 (!) 42  Temp:      TempSrc:      SpO2: 97% 100% 100% 100%  Weight:      Height:       Physical Exam Vitals and nursing note reviewed.  Constitutional:      Appearance: Normal appearance.  HENT:     Head: Normocephalic.     Nose: No rhinorrhea.     Mouth/Throat:     Mouth: Mucous membranes are moist.  Eyes:     General: No scleral icterus.    Pupils: Pupils are equal, round, and reactive to light.  Cardiovascular:     Rate and Rhythm: Regular rhythm. Tachycardia present. Occasional Extrasystoles are present.    Heart sounds: S1 normal and S2 normal.  Pulmonary:     Effort: Pulmonary effort is normal.     Breath sounds: Normal breath sounds.  Abdominal:     General: Abdomen is protuberant. Bowel sounds are normal. There is no distension.     Palpations: Abdomen is soft.     Tenderness: There is no abdominal tenderness. There is no guarding.     Comments: 's anasarca.  Musculoskeletal:     Cervical back: Neck supple.     Right lower leg: 4+ Pitting Edema present.     Left lower leg: 4+ Pitting Edema present.  Skin:    General: Skin is warm and dry.  Neurological:     Mental Status: She is alert.  Mental status is at baseline.  Psychiatric:        Mood and Affect: Mood is anxious.   Data Reviewed:  Results are pending, will review when available.  Echocardiogram 04/2022: IMPRESSIONS    1. Left ventricular ejection fraction, by estimation, is 30 to 35%. The  left ventricle has moderately decreased function. The left ventricle  demonstrates global hypokinesis. Left ventricular diastolic parameters are  consistent with Grade II diastolic  dysfunction (pseudonormalization).   2. Right ventricular systolic function is normal. The right ventricular  size is normal. There is normal pulmonary artery systolic pressure. The  estimated right ventricular systolic pressure is 64.4 mmHg.   3. Left atrial size was severely dilated.   4. The mitral valve is normal in structure. Moderate mitral valve  regurgitation. No evidence of mitral stenosis.   5. Tricuspid valve regurgitation is moderate to severe.   6. The aortic valve is normal in structure. Aortic valve regurgitation is  moderate. Aortic valve sclerosis/calcification is present, without any  evidence of aortic stenosis.   7. The inferior vena cava is dilated in size with >50% respiratory  variability, suggesting right atrial pressure of 8 mmHg.   Assessment and Plan: Principal Problem:   Acute respiratory failure with hypoxia ([place]) In the setting of:   Pulmonary edema Secondary to: Volume overload In the setting of:     Acute on chronic combined systolic and diastolic CHF (congestive heart failure) ([place])  Observation/stepdown. Supplemental oxygen as needed. Continue BiPAP ventilation. Will need HD, awaiting for hemodialysis staff. Monitor daily weights, intake and output. Monitor renal function electrolytes. Started on pressors due to hypotension. The patient confirmed again she is DNR/DNI.  Active Problems:   ESRD on hemodialysis ([place])  Will need HD as earlier mentioned.    Hyperlipidemia Currently not on  statin.    Hypertension Hold antihypertensives for now. Monitor blood pressure.    GERD (gastroesophageal reflux disease) No longer on famotidine.    CAD (coronary artery disease) Continue clopidogrel 75 mg p.o. daily.    Diabetes mellitus type 2 Carbohydrate modified diet. CBG monitoring before meals and bedtime.    Hypothyroid Continue Synthroid 225 mcg p.o. daily.     Advance Care Planning:   Code Status: DNR   Consults: Nephrology (Dr. Holley Raring) and Dr. Mortimer Fries (PCCM).  Family Communication:   Severity of Illness: The appropriate patient status for this patient is OBSERVATION. Observation status is judged to be reasonable and necessary in order to provide the required intensity of service to ensure the patient's safety. The patient's presenting symptoms, physical exam findings, and initial radiographic and laboratory data in the context of their medical condition is felt to place them at decreased risk for further clinical deterioration. Furthermore, it is anticipated that the patient will be medically stable for discharge from the hospital within 2 midnights of admission.   Author: Reubin Milan, MD 10/26/2022 6:11 PM  For on call review [email or web address].   This document was prepared using Dragon voice recognition software and may contain some unintended transcription errors.

## 2022-10-05 NOTE — ED Notes (Signed)
Pt dry heaving, provider notified and emesis bag provided.

## 2022-10-05 NOTE — ED Provider Notes (Addendum)
-----------------------------------------   5:00 PM on 10/29/2022 ----------------------------------------- Patient care assumed from Dr. Cheri Fowler.  Patient was a boarder with plan for social work and PT evaluation for possible rehab or skilled nursing placement.  Patient however has began complaining of shortness of breath and chest pain currently satting 88% on her typical 2 L increased to 3 L.  Fluids stopped.  Patient has upper respiratory noises most consistent with secretions or possible edema.  We will obtain a troponin and a BNP will obtain a chest x-ray.  Patient appears to be on dialysis Monday/Wednesday/Friday, possibility of fluid induced pulmonary edema/fluid overload.  Given the patient's shortness of breath with new increased O2 requirement and upper respiratory noises on auscultation most consistent with edema we will obtain an x-ray, Trop, BNP.  I suspect the patient may require dialysis and admission to the hospital service for ongoing workup and treatment of her shortness of breath.  Patient's chest x-ray appears consistent with pulmonary edema with fluid in her right fissure.  I discussed with the hospitalist for admission and for nephrology for possible dialysis.  Patient now requiring 4 L of oxygen to maintain sats above 90.  Patient has continued to deteriorate.  Currently satting in the 80s on a nonrebreather mask.  I have spoken to Dr. Holley Raring regarding the patient who is trying to get her arranged for dialysis.  We will place the patient on BiPAP and continue to closely monitor in the emergency department.  Patient seems to be tolerating BiPAP better.  Satting 100%.  Patient mid to the hospitalist service for ongoing workup and treatment.  Nephrology is trying to get an additional dialysis team from [facility] to help with the urgent dialysis patients.  CRITICAL CARE Performed by: Harvest Dark   Total critical care time: 30 minutes  Critical care time was exclusive of  separately billable procedures and treating other patients.  Critical care was necessary to treat or prevent imminent or life-threatening deterioration.  Critical care was time spent personally by me on the following activities: development of treatment plan with patient and/or surrogate as well as nursing, discussions with consultants, evaluation of patient's response to treatment, examination of patient, obtaining history from patient or surrogate, ordering and performing treatments and interventions, ordering and review of laboratory studies, ordering and review of radiographic studies, pulse oximetry and re-evaluation of patient's condition.       Harvest Dark, MD 10/04/2022 1743

## 2022-10-05 NOTE — ED Notes (Signed)
Primary RN speaking with nephrologist on phone regarding pt hypotension and whether or not to proceed with dialysis

## 2022-10-05 NOTE — ED Notes (Addendum)
Spoke with MD Olevia Bowens to clarify that the pt's husband had been notified of her current status. MD Olevia Bowens confirmed that he will give husband a call to provide an update.

## 2022-10-05 NOTE — Evaluation (Signed)
Physical Therapy Evaluation Patient Details Name: [name] MRN: [id number] DOB: 1934/06/13 Today's Date: 10/27/2022  History of Present Illness  89 y/o female presented to ED on 10/05/21 for weakness, "butt cramps", and diarrhea. Workup pending. PMH: ESRD on HD, CVA, CAD s/p CABG, HTN, R breast cancer  Clinical Impression  Patient admitted with the above. PTA, patient lives with husband and reports patient typically independent with RW but intermittently requires assistance dependent on fatigue level after HD sessions. Patient presents with weakness, impaired balance, and decreased activity tolerance. Patient utilizes 2L O2 at all times at home. Required modA for bed mobility from stretcher and light minA to stand from elevated bed surface. Ambulated 8' with RW and minA but demonstrates mild posterior lean with static standing. Easily fatigues with minimal mobility. Patient will benefit from skilled PT services during acute stay to address listed deficits. Recommend HHPT at discharge to maximize functional independence and safety. Will continue to follow acutely and update recommendations as necessary.      Recommendations for follow up therapy are one component of a multi-disciplinary discharge planning process, led by the attending physician.  Recommendations may be updated based on patient status, additional functional criteria and insurance authorization.  Follow Up Recommendations Home health PT      Assistance Recommended at Discharge Frequent or constant Supervision/Assistance  Patient can return home with the following  A little help with walking and/or transfers;A little help with bathing/dressing/bathroom;Assistance with cooking/housework;Assist for transportation;Help with stairs or ramp for entrance    Equipment Recommendations None recommended by PT  Recommendations for Other Services       Functional Status Assessment Patient has had a recent decline in their functional  status and demonstrates the ability to make significant improvements in function in a reasonable and predictable amount of time.     Precautions / Restrictions Precautions Precautions: Fall Precaution Comments: O2 Restrictions Weight Bearing Restrictions: No      Mobility  Bed Mobility Overal bed mobility: Needs Assistance Bed Mobility: Supine to Sit, Sit to Supine     Supine to sit: Mod assist Sit to supine: Min assist   General bed mobility comments: assist for trunk elevation and LE management from elevated stretcher. Assist for LE management back into bed    Transfers Overall transfer level: Needs assistance Equipment used: Rolling Narayan Scull (2 wheels) Transfers: Sit to/from Stand Sit to Stand: Min assist           General transfer comment: light minA to stand from elevated surface as patient reports being unable to but once therapist provided light assistance, she was able to stand    Ambulation/Gait Ambulation/Gait assistance: Min assist Gait Distance (Feet): 8 Feet Assistive device: Rolling Sherrita Riederer (2 wheels) Gait Pattern/deviations: Step-to pattern, Decreased stride length, Trunk flexed Gait velocity: decreased     General Gait Details: trunk flexed throughout. Assist for balance and Rw management. Slight posterior lean with static standing.  Stairs            Wheelchair Mobility    Modified Rankin (Stroke Patients Only)       Balance Overall balance assessment: Needs assistance Sitting-balance support: No upper extremity supported, Feet supported Sitting balance-Leahy Scale: Fair     Standing balance support: Bilateral upper extremity supported, Reliant on assistive device for balance Standing balance-Leahy Scale: Poor Standing balance comment: reliant on UE support  Pertinent Vitals/Pain Pain Assessment Pain Assessment: Faces Faces Pain Scale: Hurts little more Pain Location: stomach Pain  Descriptors / Indicators: Discomfort, Grimacing Pain Intervention(s): Monitored during session, Repositioned    Home Living Family/patient expects to be discharged to:: Private residence Living Arrangements: Spouse/significant other Available Help at Discharge: Family Type of Home: House Home Access: Stairs to enter Entrance Stairs-Rails: Right;Can reach Software engineer of Steps: 2   Home Layout: One level Home Equipment: Conservation officer, nature (2 wheels);BSC/3in1 Additional Comments: On 2L O2 [place] at baseline    Prior Function Prior Level of Function : Needs assist             Mobility Comments: ambulates with RW. Requires assistance intermittently especially on HD days ADLs Comments: reports typically being able to complete ADLs independently     Hand Dominance        Extremity/Trunk Assessment   Upper Extremity Assessment Upper Extremity Assessment: Generalized weakness    Lower Extremity Assessment Lower Extremity Assessment: Generalized weakness    Cervical / Trunk Assessment Cervical / Trunk Assessment: Kyphotic  Communication   Communication: No difficulties  Cognition Arousal/Alertness: Awake/alert Behavior During Therapy: WFL for tasks assessed/performed Overall Cognitive Status: Within Functional Limits for tasks assessed                                          General Comments      Exercises     Assessment/Plan    PT Assessment Patient needs continued PT services  PT Problem List Decreased strength;Decreased activity tolerance;Decreased balance;Decreased mobility;Cardiopulmonary status limiting activity       PT Treatment Interventions DME instruction;Functional mobility training;Therapeutic activities;Therapeutic exercise;Stair training;Gait training;Balance training;Patient/family education    PT Goals (Current goals can be found in the Care Plan section)  Acute Rehab PT Goals Patient Stated Goal: to get  stronger PT Goal Formulation: With patient/family Time For Goal Achievement: 10/19/22 Potential to Achieve Goals: Fair    Frequency Min 2X/week     Co-evaluation               AM-PAC PT "6 Clicks" Mobility  Outcome Measure Help needed turning from your back to your side while in a flat bed without using bedrails?: A Lot Help needed moving from lying on your back to sitting on the side of a flat bed without using bedrails?: A Lot Help needed moving to and from a bed to a chair (including a wheelchair)?: A Little Help needed standing up from a chair using your arms (e.g., wheelchair or bedside chair)?: A Little Help needed to walk in hospital room?: A Little Help needed climbing 3-5 steps with a railing? : Total 6 Click Score: 14    End of Session Equipment Utilized During Treatment: Gait belt;Oxygen Activity Tolerance: Patient tolerated treatment well Patient left: in bed;with call bell/phone within reach;with family/visitor present (in hallway on stretcher) Nurse Communication: Mobility status PT Visit Diagnosis: Unsteadiness on feet (R26.81);Muscle weakness (generalized) (M62.81);Difficulty in walking, not elsewhere classified (R26.2)    Time: 1610-9604 PT Time Calculation (min) (ACUTE ONLY): 25 min   Charges:   PT Evaluation $PT Eval Low Complexity: 1 Low PT Treatments $Therapeutic Activity: 8-22 mins        Iyah Laguna A. Gilford Rile PT, DPT [place] A Oddis Westling 10/15/2022, 4:18 PM

## 2022-10-05 NOTE — Consult Note (Signed)
NAME:  [name], MRN:  [id number], DOB:  [birth year], LOS: 0 ADMISSION DATE:  10/02/2022, CONSULTATION DATE:  10/05/21 REFERRING MD:  Dr. Olevia Bowens, CHIEF COMPLAINT:   Weakness  History of Present Illness:  89 year old female presenting to [facility] ED from home on 10/30/2022 with complaints of weakness in the setting of diarrheal illness. History provided per chart review and interview with spouse via telephone as patient is currently altered and nonverbal. Challenging interview due to spouse's severe hard of hearing. On arrival patient reported 2 days of worsening weakness and diarrhea as well as cramping in her buttocks which she reports happens when dialysis sessions " go too far".  She reported she normally walks with a walker but has been unable to get up all day on 10/16/2022.  She denied any recent travel or sick contacts.  She also denied vision changes, tinnitus, difficulty speaking, facial droop, sore throat, chest pain, shortness of breath, abdominal pain, nausea/vomiting, dysuria or weakness/numbness/paresthesias in any extremity.  ED course: Upon arrival patient was afebrile with stable vital signs, 96% on nasal cannula.  Worked up for suspected viral enteritis with IV fluid resuscitation antidiarrheal meds and a short course of antibiotics.  Patient noted to have anasarca and edema on abdominal CT as well as ascites.  Labs significant for hypomagnesemia, slightly low chloride which also appears baseline, baseline renal function, significantly elevated BNP at  > 3000, mildly elevated troponin & respiratory acidosis.  TRH consulted for admission however patient continued to deteriorate in ED with increasing oxygen requirements suspect secondary to pulmonary edema/fluid volume overload.  Nephrology consulted for emergent IHD.  However while waiting for bed placement and IHD the patient became hypotensive requiring vasopressor support. Medications given: 4 mg morphine, 2 g mag, 1300 mL IV fluid bolus,  80 mg Lasix, albumin & Levophed drip started Initial Vitals: 98.7, 87, 20, 117/49 and 96% on nasal cannula Significant labs: (Labs/ Imaging personally reviewed) I, Domingo Pulse [place], AGACNP-BC, personally viewed and interpreted this ECG. EKG Interpretation: Date: 10/04/2022, EKG Time: 10:57, Rate: 86, Rhythm: Suspect NSR, QRS Axis: LAD, Intervals: Normal, ST/T Wave abnormalities: None, Narrative Interpretation: NSR Chemistry: Na+: 137, K+: 4.6, BUN/Cr.:  50/4.94, Cl: 96 serum CO2/ AG: 27/14, Mg: 1.5, albumin: 2.8 Hematology: WBC: 8, Hgb: 10.3,  Troponin: 31, BNP: 3495.5, COVID-19 & Influenza A/B: Negative ABG: 7.26/56/172/25.1  CXR 10/26/2022: Cardiomegaly with diffuse interstitial and groundglass opacities suspect for pulmonary edema with small right effusion CT abdomen pelvis without contrast 10/07/2022: Anasarca with diffuse soft tissue edema, trace pleural effusions and moderate ascites.  No definite acute abdominopelvic findings.  Diffuse distal colonic diverticulosis without definitive signs of acute diverticulitis.  Bilateral renal atrophy consistent with chronic renal failure.  Probable gallbladder sludge without evidence of cholecystitis or biliary dilatation.  PCCM consulted for a assistance in management due to circulatory shock suspect secondary to cardiogenic shock from fluid volume overload in the setting of ESRD and HFpEF requiring vasopressor support.  Pertinent  Medical History  CAD Type 2 diabetes mellitus HFpEF Hyperlipidemia Hypertension Hypothyroidism CVA ESRD on IHD (MWF) Right breast cancer  Significant Hospital Events: Including procedures, antibiotic start and stop dates in addition to other pertinent events   10/02/2022: Admit to ICU with suspected cardiogenic shock in the setting of HFpEF and ESRD on IHD requiring vasopressor support and acute hypoxic respiratory failure requiring BiPAP  Interim History / Subjective:  Patient RASS -4 on BiPAP at 100% FiO2,  rhythm with frequent PVCs, patient significantly hypotensive with SBP in  the 70s.  Levophed drip increased to 40 mics with very little impact on blood pressure.  Discussed with husband over the phone relating severe concerns for current clinical status.  Central line placement consent obtained.  Discussed with Dr. Zollie Scale from nephrology we will hold off on IHD or CRRT overnight due to hemodynamic instability and reassess in the morning.  Objective   Blood pressure 97/76, pulse (!) 105, temperature 98.6 F (37 C), temperature source Oral, resp. rate (!) 25, height '5\' 4"'$  (1.626 m), weight 74.8 kg, SpO2 100 %.    FiO2 (%):  [100 %] 100 %   Intake/Output Summary (Last 24 hours) at 11/01/2022 1955 Last data filed at 10/10/2022 1643 Gross per 24 hour  Intake 800 ml  Output --  Net 800 ml   Filed Weights   10/28/2022 1052  Weight: 74.8 kg    Examination: General: Adult female, critically ill, lying in bed, chronically ill-appearing HEENT: MM pink/moist, anicteric, atraumatic, neck supple Neuro: RASS -4, unable to follow commands, PERRL +3 CV: s1s2 RRR, NSR with frequent PVCs on monitor, no r/m/g Pulm: Regular, non labored on BiPAP at 100% FiO2, breath sounds diminished throughout GI: soft, rounded, bs x 4 Skin: Chronic wound noted on right shin, scattered ecchymosis and abrasions also noted  Extremities: warm/dry, pulses + 2 R/P, +4 edema noted BLE  Resolved Hospital Problem list     Assessment & Plan:  Circulatory shock suspect secondary to cardiogenic in the setting of HFpEF and ESRD on IHD HFpEF Patient received 1300 mL of IV fluid resuscitation due to weakness and suspected viral diarrheal enteritis.  After resuscitation patient became tachycardic with deteriorating respiratory status and was placed on vasopressor support after receiving 80 of Lasix without effect. -Continue Levophed, once central line is placed will switch to quad strength, wean as tolerated to maintain MAP > 65 -Insert  Foley -Initiate vasopressin at 0.04 once central line is placed, wean as tolerated -Continuous cardiac monitoring -Trend troponin -Will need echocardiogram -Follow-up BNP -Daily weights, consider additional diuresis as hemodynamics allow - CVP Q 4 -Consult cardiology as needed  ESRD on IHD Discussed with Dr. Holley Raring, too unstable for IHD or CRRT overnight.  Will reconsider therapeutic IHD on Oct 07, 2022 if hemodynamics allow - Strict I/O's: alert provider if UOP < 0.5 mL/kg/hr - Daily BMP, replace electrolytes PRN - Avoid nephrotoxic agents as able, ensure adequate renal perfusion - Nephrology following, appreciate input   Acute Hypoxic/ Hypercapnic Respiratory Failure secondary to pulmonary edema versus possible CAP - Continue BIPAP overnight, wean FiO2 as tolerated - Supplemental O2 to maintain SpO2 > 90% - Intermittent chest x-ray & ABG PRN - Ensure adequate pulmonary hygiene  - F/u cultures, trend PCT -Initiate CAP coverage: Ceftriaxone and doxycycline (low threshold for discontinuation) - bronchodilators PRN  Best Practice (right click and "Reselect all SmartList Selections" daily)  Diet/type: NPO DVT prophylaxis: SCD GI prophylaxis: PPI will start Lines: Central line and yes and it is still needed, consider arterial line placement Foley:  Yes, and it is still needed Code Status:  DNR Last date of multidisciplinary goals of care discussion [10/24/2022- see subjective for conversation]  Labs   CBC: Recent Labs  Lab 10/12/2022 1102  WBC 8.0  NEUTROABS 7.6  HGB 10.3*  HCT 34.0*  MCV 98.8  PLT 027    Basic Metabolic Panel: Recent Labs  Lab 10/02/2022 1102  NA 137  K 4.6  CL 96*  CO2 27  GLUCOSE 97  BUN 50*  CREATININE 4.94*  CALCIUM 7.5*  MG 1.5*   GFR: Estimated Creatinine Clearance: 8.1 mL/min (A) (by C-G formula based on SCr of 4.94 mg/dL (H)). Recent Labs  Lab 11/01/2022 1102  WBC 8.0    Liver Function Tests: Recent Labs  Lab 10/23/2022 1102  AST 18   ALT 11  ALKPHOS 80  BILITOT 0.8  PROT 7.9  ALBUMIN 2.8*   No results for input(s): "LIPASE", "AMYLASE" in the last 168 hours. No results for input(s): "AMMONIA" in the last 168 hours.  ABG    Component Value Date/Time   PHART 7.26 (L) 10/17/2022 1737   PCO2ART 56 (H) 10/04/2022 1737   PO2ART 172 (H) 10/02/2022 1737   HCO3 25.1 10/19/2022 1737   TCO2 24 11/04/2020 1208   ACIDBASEDEF 2.8 (H) 10/08/2022 1737   O2SAT 99.7 10/11/2022 1737     Coagulation Profile: No results for input(s): "INR", "PROTIME" in the last 168 hours.  Cardiac Enzymes: No results for input(s): "CKTOTAL", "CKMB", "CKMBINDEX", "TROPONINI" in the last 168 hours.  HbA1C: Hgb A1c MFr Bld  Date/Time Value Ref Range Status  05/21/2021 07:00 AM 4.4 (L) 4.8 - 5.6 % Final    Comment:    (NOTE) Pre diabetes:          5.7%-6.4%  Diabetes:              >6.4%  Glycemic control for   <7.0% adults with diabetes   06/23/2020 04:44 AM 5.4 4.8 - 5.6 % Final    Comment:    (NOTE) Pre diabetes:          5.7%-6.4%  Diabetes:              >6.4%  Glycemic control for   <7.0% adults with diabetes     CBG: Recent Labs  Lab 10/04/2022 1951  GLUCAP 51*    Review of Systems:   UTA-patient unable to participate in interview given RASS -4  Past Medical History:  She,  has a past medical history of Arthritis, Bilateral Macular degeneration, Breast cancer ([place]) (2012), Breast mass, right, CAD S/P CABG x 4, Cancer ([place]), Complication of anesthesia, Coronary artery disease (08/22/2012), Diabetes ([place]), Diastolic dysfunction, DOE (dyspnea on exertion), ESRD (end stage renal disease) ([place]), Gastritis, GERD (gastroesophageal reflux disease), Headache(784.0), History of breast cancer, History of seasonal allergies, Hyperlipemia, Hypertension, Hypothyroidism, Neuropathy, Personal history of radiation therapy, Pneumonia, PONV (postoperative nausea and vomiting), Stroke ([place]), and Vertigo.   Surgical History:   Past  Surgical History:  Procedure Laterality Date   A/V FISTULAGRAM Left 01/13/2021   Procedure: A/V FISTULAGRAM;  Surgeon: Algernon Huxley, MD;  Location: [place] [facility];  Service: Cardiovascular;  Laterality: Left;   A/V SHUNT INTERVENTION Left 05/23/2021   Procedure: A/V SHUNT INTERVENTION;  Surgeon: Algernon Huxley, MD;  Location: [place] [facility];  Service: Cardiovascular;  Laterality: Left;   ABDOMINAL HYSTERECTOMY     APPENDECTOMY     AV FISTULA PLACEMENT Left 11/04/2020   Procedure: ARTERIOVENOUS (AV) FISTULA CREATION (BRACHIOCEPHALIC);  Surgeon: Algernon Huxley, MD;  Location: [facility];  Service: Vascular;  Laterality: Left;   BACK SURGERY     BREAST BIOPSY Right    2012 positive- [facility]   BREAST LUMPECTOMY Right 2012   F/U radiation    BREAST SURGERY     CARDIAC CATHETERIZATION  2014   CAROTID PTA/STENT INTERVENTION Left 06/14/2020   Procedure: CAROTID PTA/STENT INTERVENTION;  Surgeon: Algernon Huxley, MD;  Location: [place] [facility];  Service: Cardiovascular;  Laterality: Left;   CATARACT EXTRACTION W/PHACO Right 08/14/2017   Procedure: CATARACT EXTRACTION PHACO AND INTRAOCULAR LENS PLACEMENT (IOC);  Surgeon: Birder Robson, MD;  Location: [facility];  Service: Ophthalmology;  Laterality: Right;  [place] 00:43.0 AP% 17.1 CDE 7.37 Fluid Pack lot # [id number]   CATARACT EXTRACTION W/PHACO Left 10/09/2017   Procedure: CATARACT EXTRACTION PHACO AND INTRAOCULAR LENS PLACEMENT (IOC);  Surgeon: Birder Robson, MD;  Location: [facility];  Service: Ophthalmology;  Laterality: Left;  [place] 00:30.3 AP% 11.0 CDE 3.33 Fluid Pack Lot # [id number] H   CORONARY ARTERY BYPASS GRAFT  09/05/2012   Procedure: CORONARY ARTERY BYPASS GRAFTING (CABG);  Surgeon: Rexene Alberts, MD;  Location: [place];  Service: Open Heart Surgery;  Laterality: N/A;  CABG x four, using left internal mammary artery and bilateral greater saphenous vein harvested endoscopically   DIALYSIS/PERMA CATHETER INSERTION Left 06/25/2020   Procedure:  DIALYSIS/PERMA CATHETER INSERTION;  Surgeon: Algernon Huxley, MD;  Location: [place] [facility];  Service: Cardiovascular;  Laterality: Left;   DIALYSIS/PERMA CATHETER INSERTION N/A 08/10/2020   Procedure: DIALYSIS/PERMA CATHETER INSERTION;  Surgeon: Katha Cabal, MD;  Location: [place] [facility];  Service: Cardiovascular;  Laterality: N/A;   DIALYSIS/PERMA CATHETER INSERTION N/A 05/23/2021   Procedure: DIALYSIS/PERMA CATHETER INSERTION;  Surgeon: Algernon Huxley, MD;  Location: [place] [facility];  Service: Cardiovascular;  Laterality: N/A;   DIALYSIS/PERMA CATHETER REMOVAL N/A 08/05/2020   Procedure: DIALYSIS/PERMA CATHETER REMOVAL;  Surgeon: Algernon Huxley, MD;  Location: [place] [facility];  Service: Cardiovascular;  Laterality: N/A;   INTRAOPERATIVE TRANSESOPHAGEAL ECHOCARDIOGRAM  09/05/2012   Procedure: INTRAOPERATIVE TRANSESOPHAGEAL ECHOCARDIOGRAM;  Surgeon: Rexene Alberts, MD;  Location: [place];  Service: Open Heart Surgery;  Laterality: N/A;   KNEE SURGERY     Bilateral nerve block   LAPAROSCOPIC NISSEN FUNDOPLICATION     OVARIAN CYST REMOVAL       Social History:   reports that she quit smoking about 33 years ago. Her smoking use included cigarettes. She has never used smokeless tobacco. She reports that she does not drink alcohol and does not use drugs.   Family History:  Her family history includes Breast cancer in her cousin; Cancer in her father; Heart disease in her brother and mother; Hyperlipidemia in her brother; Hypertension in her brother. There is no history of Kidney disease.   Allergies Allergies  Allergen Reactions   Clonidine Other (See Comments) and Shortness Of Breath    Other reaction(s): Other (see comments) Other reaction(s): Other (See Comments)   Darvocet [Propoxyphene N-Acetaminophen] Anaphylaxis   Hydralazine     Other reaction(s): Other (See Comments) glomerulonephritis   Hydralazine Hcl Other (See Comments)    glomerulonephritis   Metoprolol  Shortness Of Breath    Pt reports "very winded, couldn't catch breath"    Esomeprazole     Other reaction(s): Unknown   Guaifenesin     Other reaction(s): Unknown Other reaction(s): Unknown Other reaction(s): Unknown Other reaction(s): Unknown   Phenylephrine-Guaifenesin     Other reaction(s): Unknown   Rabeprazole     Other reaction(s): Unknown   Telithromycin     Other reaction(s): Unknown Other reaction(s): Unknown Other reaction(s): Unknown   Fluocinolone Other (See Comments)    Feels crazy Other reaction(s): Other (see comments) Other reaction(s): Other (See Comments) Feels crazy Feels crazy   Iodinated Contrast Media Nausea And Vomiting and Other (See Comments)    Burning Other reaction(s): Other (see comments) Other reaction(s): Other (See Comments) Burning Nausea &  vomiting   Levaquin [Levofloxacin] Nausea Only   Statins Other (See Comments)    Myalgia Other reaction(s): Other (See Comments), Unknown, Unknown Myalgia     Home Medications  Prior to Admission medications   Medication Sig Start Date End Date Taking? Authorizing Provider  acetaminophen (TYLENOL) 325 MG tablet Take 2 tablets (650 mg total) by mouth every 6 (six) hours as needed for headache. 06/20/21  Yes Wieting, Richard, MD  ALPRAZolam Duanne Moron) 0.5 MG tablet Take 1 tablet (0.5 mg total) by mouth 2 (two) times daily as needed for anxiety. 06/20/21  Yes Wieting, Richard, MD  calcium-vitamin D (OSCAL WITH D) 500-200 MG-UNIT tablet Take 1 tablet by mouth daily.   Yes [provider]  clopidogrel (PLAVIX) 75 MG tablet Take 75 mg by mouth daily.   Yes [provider]  Cyanocobalamin 1000 MCG SUBL Take 1,000 mcg by mouth daily.    Yes [provider]  gabapentin (NEURONTIN) 100 MG capsule Take 1 capsule by mouth 2 (two) times daily. 08/01/21  Yes [provider]  HYDROcodone-acetaminophen (NORCO/VICODIN) 5-325 MG tablet Take 1 tablet by mouth every 6 (six) hours as needed  for moderate pain. 06/20/21  Yes Loletha Grayer, MD  levothyroxine (SYNTHROID) 150 MCG tablet Take 225 mcg by mouth daily before breakfast. 04/19/21  Yes [provider]  metoprolol tartrate (LOPRESSOR) 50 MG tablet Take 50 mg by mouth 3 (three) times daily. 08/21/22  Yes [provider]  ondansetron (ZOFRAN ODT) 4 MG disintegrating tablet Take 1 tablet (4 mg total) by mouth every 8 (eight) hours as needed for nausea or vomiting. 05/23/22  Yes Gollan, Kathlene November, MD  torsemide (DEMADEX) 100 MG tablet Take 1 tablet (100 mg total) by mouth 3 (three) times a week. Tuesday, Thursday, & Saturday (non dialysis days) 06/06/22  Yes Gollan, Kathlene November, MD  famotidine (PEPCID) 10 MG tablet Take 1 tablet (10 mg total) by mouth daily. Patient not taking: Reported on 10/04/2022 06/20/21   Loletha Grayer, MD  OXYGEN Inhale 2 L into the lungs daily.    [provider]     Critical care time: 62 minutes       Venetia Night, AGACNP-BC Acute Care Nurse Practitioner [facility]   [phone] / [phone] Please see Amion for pager details.

## 2022-10-05 NOTE — ED Triage Notes (Addendum)
Pt arrives from home via EMS w/ C?O increased weakness, "butt cramps", and diarrhea since yesterday. Pt uses walker at home, was unable to ambulate today. Pt is dialysis pt, MWF, did not miss. Pt wears 2L [place] at home, arrives with in place.  130/86 89 hr 98.0 AO

## 2022-10-05 NOTE — ED Notes (Signed)
Pt placed on bipap  

## 2022-10-05 NOTE — Procedures (Signed)
Central Venous Catheter Insertion Procedure Note  [name]  [id number]  April 27, 1934  Date:10/26/2022  Time:9:48 PM   Provider Performing:Rameen Gohlke L Rust-Chester   Procedure: Insertion of Non-tunneled Central Venous [phone]) with US guidance ([id number])   Indication(s) Medication administration  Consent Risks of the procedure as well as the alternatives and risks of each were explained to the patient and/or caregiver.  Consent for the procedure was obtained and is signed in the bedside chart  Anesthesia Topical only with 1% lidocaine   Timeout Verified patient identification, verified procedure, site/side was marked, verified correct patient position, special equipment/implants available, medications/allergies/relevant history reviewed, required imaging and test results available.  Sterile Technique Maximal sterile technique including full sterile barrier drape, hand hygiene, sterile gown, sterile gloves, mask, hair covering, sterile ultrasound probe cover (if used).  Procedure Description Area of catheter insertion was cleaned with chlorhexidine and draped in sterile fashion.  With real-time ultrasound guidance a central venous catheter was placed into the left internal jugular vein. Nonpulsatile blood flow and easy flushing noted in all ports.  The catheter was sutured in place and sterile dressing applied.  Complications/Tolerance None; patient tolerated the procedure well. Chest X-ray is ordered to verify placement for internal jugular or subclavian cannulation.   Chest x-ray is not ordered for femoral cannulation.  EBL Minimal  Specimen(s) None  Venetia Night, AGACNP-BC Acute Care Nurse Practitioner [facility]   [phone] / [phone] Please see Amion for pager details.

## 2022-10-05 NOTE — Progress Notes (Signed)
       CROSS COVER NOTE  NAME: [name] MRN: [id number] DOB : 04-Jun-1934   HPI/Events of Note   Notified by RN patient SBP in 80s on 7 of levo and HD nurse at bedside Immediately advised to increase levo and notify nephrology  Assessment and  Interventions   Assessment: General - very ill appearing Pulm - shalllow rapid breathing - BIPAP at 100% 12/8. RR in 40s difficult obtaining accurate saturation. VT 230-350 CV ST levo now at 10 -systolic 82N,  Review of prior labs  Latest Reference Range & Units 10/30/2022 17:37  pH, Arterial 7.35 - 7.45  7.26 (L)  pCO2 arterial 32 - 48 mmHg 56 (H)  pO2, Arterial 83 - 108 mmHg 172 (H)  Acid-base deficit 0.0 - 2.0 mmol/L 2.8 (H)  Bicarbonate 20.0 - 28.0 mmol/L 25.1  O2 Saturation % 99.7  Patient temperature  37.0  Collection site  RIGHT RADIAL  Allens test (pass/fail) PASS  PASS    Latest Reference Range & Units 10/16/2022 16:54  B Natriuretic Peptide 0.0 - 100.0 pg/mL 3,495.5 (H)  Troponin I (High Sensitivity) <18 ng/L 31 (H)   Chest xray IMPRESSION: Cardiomegaly with diffuse interstitial and ground-glass opacity, suspect for pulmonary edema. Small right effusion.       Plan: CCM consulted - discussed with Domingo Pulse Rust-Chester Requested RT to bedside BIPAP adjustment Informed patient of probable need for central line - talked to husband via phone and informed of patient decompensation and possible need for central line. Husband also frail elderly and unable to come to hospital at this time - ensured he will be kept informed Cbg 51 - amp D50 started D10 at [place] NP [facility]

## 2022-10-05 NOTE — Progress Notes (Signed)
       CROSS COVER NOTE  NAME: [name] MRN: [id number] DOB : Feb 17, 1934    HPI/Events of Note   Notified by RN and Critical Care NP patient on 3 vasopressors and decompensating. Husband made aware via phone by CCM NP  Assessment and  Interventions   Assessment: Arrived at ER. Patient already in asystole. No neuro response, pulse or spontaneous respirations. Husband arrived, informed of patient death. Support given. Chaplain at patient bedside with husband.  Arrangements made so patient could get back home safely with officer escort.  Patient time of death Jan 12, 2215 PM on 04-Nov-2022

## 2022-10-05 NOTE — Progress Notes (Signed)
Patient well-known to [place] as we follow her for outpatient hemodialysis.  Came in with weakness and diarrhea apparently.  Patient was given a little over a liter of IV fluids.  She now has developed worsening shortness of breath and signs of pulmonary edema.  She is currently on BiPAP.  Case discussed with Dr. Kerman Passey.  Currently have high volume of HD patients.  Will attempt our best to accommodate patient for HD, however timing of HD unclear at the moment.  This was discussed with Dialysis Nurse Manager, hopefully someone from [facility] will come over soon to perform HD treatment for the patient.

## 2022-10-05 NOTE — ED Provider Notes (Signed)
[facility] Provider Note   Event Date/Time   First MD Initiated Contact with Patient 10/04/2022 1054     (approximate) History  Weakness  HPI [name] is a 89 y.o. female with a stated past medical history of end-stage renal disease on dialysis Monday/Wednesday/Friday who presents for 2 days of worsening weakness and diarrhea.  Patient also states that she is having cramping in her buttocks.  Patient states that this has happened previously when she has had dialysis sessions that "go too far".  Patient states that she normally walks with a walker but has been unable to get up at all so far today.  Patient denies any recent travel or sick contacts ROS: Patient currently denies any vision changes, tinnitus, difficulty speaking, facial droop, sore throat, chest pain, shortness of breath, abdominal pain, nausea/vomiting, dysuria, or weakness/numbness/paresthesias in any extremity   Physical Exam  Triage Vital Signs: ED Triage Vitals [10/16/2022 1052]  Enc Vitals Group     BP      Pulse      Resp      Temp      Temp src      SpO2      Weight 165 lb (74.8 kg)     Height '5\' 4"'$  (1.626 m)     Head Circumference      Peak Flow      Pain Score      Pain Loc      Pain Edu?      Excl. in [place]?    Most recent vital signs: Vitals:   10/14/2022 1058 10/08/2022 1204  BP: (!) 117/49 (!) 117/51  Pulse: 87 88  Resp: 20 19  Temp: 98.6 F (37 C)   SpO2: 96% 100%   General: Awake, oriented x4. CV:  Good peripheral perfusion.  Resp:  Normal effort.  Abd:  No distention.  Other:  Elderly overweight Caucasian female laying in stretcher with eyes closed but answering questions and easily awoken ED Results / Procedures / Treatments  Labs (all labs ordered are listed, but only abnormal results are displayed) Labs Reviewed  COMPREHENSIVE METABOLIC PANEL - Abnormal; Notable for the following components:      Result Value   Chloride 96 (*)    BUN 50 (*)    Creatinine,  Ser 4.94 (*)    Calcium 7.5 (*)    Albumin 2.8 (*)    GFR, Estimated 8 (*)    All other components within normal limits  MAGNESIUM - Abnormal; Notable for the following components:   Magnesium 1.5 (*)    All other components within normal limits  CBC WITH DIFFERENTIAL/PLATELET - Abnormal; Notable for the following components:   RBC 3.44 (*)    Hemoglobin 10.3 (*)    HCT 34.0 (*)    RDW 15.9 (*)    Lymphs Abs 0.3 (*)    All other components within normal limits  RESP PANEL BY RT-PCR (RSV, FLU A&B, COVID)  [id number]   EKG ED ECG REPORT I, Naaman Plummer, the attending physician, personally viewed and interpreted this ECG. Date: 10/13/2022 EKG Time: 1057 Rate: 86 Rhythm: normal sinus rhythm QRS Axis: normal Intervals: normal ST/T Wave abnormalities: normal Narrative Interpretation: no evidence of acute ischemia RADIOLOGY ED MD interpretation: CT of the abdomen and pelvis with IV contrast shows anasarca with diffuse soft tissue edema, trace pleural effusions, and moderate ascites.  There is no definite acute abdominal pelvic findings -Agree with radiology assessment Official  radiology report(s): CT ABDOMEN PELVIS WO CONTRAST  Result Date: 10/11/2022 CLINICAL DATA:  Abdominal pain, acute, nonlocalized. Increasing weakness with diarrhea since yesterday. Hemodialysis patient. EXAM: CT ABDOMEN AND PELVIS WITHOUT CONTRAST TECHNIQUE: Multidetector CT imaging of the abdomen and pelvis was performed following the standard protocol without IV contrast. RADIATION DOSE REDUCTION: This exam was performed according to the departmental dose-optimization program which includes automated exposure control, adjustment of the mA and/or kV according to patient size and/or use of iterative reconstruction technique. COMPARISON:  Radiographs 05/09/2018, renal ultrasound 06/10/2020 and chest CT 05/04/2016. FINDINGS: Lower chest: The heart is enlarged status post median sternotomy. There is aortic and coronary  artery atherosclerosis. There are trace pleural effusions with mild scarring at both lung bases. Hepatobiliary: No focal hepatic abnormalities are identified on noncontrast imaging. There is layering high density material within the gallbladder lumen, likely sludge. No evidence of gallbladder wall thickening or significant biliary dilatation. Pancreas: Unremarkable. No pancreatic ductal dilatation or surrounding inflammatory changes. Spleen: Normal in size without focal abnormality. Adrenals/Urinary Tract: Both adrenal glands appear normal. Both kidneys are atrophied with cortical thinning consistent with chronic renal failure. There is a small nonobstructing calculus in the upper pole of the right kidney. No evidence of ureteral calculus or hydronephrosis. The bladder appears unremarkable for its degree of distention. Stomach/Bowel: No enteric contrast administered. The stomach appears unremarkable for its degree of distension. No evidence of bowel wall thickening, distention or surrounding inflammatory change. Diffuse diverticulosis throughout the descending and sigmoid colon without definite signs of acute diverticulitis. Vascular/Lymphatic: There are no enlarged abdominal or pelvic lymph nodes. Aortic and branch vessel atherosclerosis without evidence of aneurysm. Reproductive: Probable hysterectomy.  No adnexal mass. Other: There is a moderate amount of low-density ascites throughout the peritoneal cavity. No free air or focal extraluminal fluid collection identified. There is generalized soft tissue edema throughout the subcutaneous fat of the lower chest, abdomen and pelvis consistent with anasarca. No hernias are identified. Musculoskeletal: No acute or significant osseous findings. Multilevel spondylosis, most advanced at L4-5. IMPRESSION: 1. Anasarca with diffuse soft tissue edema, trace pleural effusions and moderate ascites. Consider paracentesis if clinical concern for spontaneous bacterial peritonitis.  2. No definite acute abdominopelvic findings. 3. Diffuse distal colonic diverticulosis without definite signs of acute diverticulitis. 4. Bilateral renal atrophy consistent with chronic renal failure. 5. Probable gallbladder sludge without evidence of cholecystitis or biliary dilatation. 6.  Aortic Atherosclerosis (ICD10-I70.0). Electronically Signed   By: Richardean Sale M.D.   On: 10/08/2022 13:08   PROCEDURES: Critical Care performed: No .1-3 Lead EKG Interpretation  Performed by: Naaman Plummer, MD Authorized by: Naaman Plummer, MD     Interpretation: normal     ECG rate:  71   ECG rate assessment: normal     Rhythm: sinus rhythm     Ectopy: none     Conduction: normal    MEDICATIONS ORDERED IN ED: Medications  loperamide (IMODIUM) capsule 2 mg (has no administration in time range)  ipratropium-albuterol (DUONEB) 0.5-2.5 (3) MG/3ML nebulizer solution 3 mL (has no administration in time range)  sodium chloride 0.9 % bolus 1,000 mL (has no administration in time range)  loperamide (IMODIUM) capsule 4 mg (4 mg Oral Given 10/18/2022 1107)  magnesium sulfate IVPB 2 g 50 mL (0 g Intravenous Stopped 10/30/2022 1304)  sodium chloride 0.9 % bolus 500 mL (0 mLs Intravenous Stopped 10/15/2022 1307)  sodium chloride 0.9 % bolus 500 mL (0 mLs Intravenous Stopped 10/17/2022 1421)   IMPRESSION /  MDM / ASSESSMENT AND PLAN / ED COURSE  I reviewed the triage vital signs and the nursing notes.                             The patient is on the cardiac monitor to evaluate for evidence of arrhythmia and/or significant heart rate changes. Patient's presentation is most consistent with acute presentation with potential threat to life or bodily function. This patient presents with diarrhea consistent with likely viral enteritis. Doubt acute bacterial diarrhea. Considered, but think unlikely, partial SBO, appendicitis, diverticulitis, other intraabdominal infection. Low suspicion for secondary causes of diarrhea such as  hyperadrenergic state, pheo, adrenal crisis, hyperthyroidism, or sepsis. Doubt antibiotic associated diarrhea.  Plan: PO rehydration, reassess, discharge with OTC antidiarrheal meds//short course antibiotics Patient does not show any signs of active infection at this time.  Patient does have anasarca and edema on abdominal CT as well as ascites likely related to patient's volume status from end-stage renal disease.  Patient's vital signs are stable and she has not had any bowel movements since she has been in our emergency department however patient is unable to get up from stretcher and walk unassisted and therefore she will need physical therapy evaluation for possible placement Dispo: [place]   FINAL CLINICAL IMPRESSION(S) / ED DIAGNOSES   Final diagnoses:  Diarrhea of presumed infectious origin  Generalized weakness   Rx / DC Orders   ED Discharge Orders     None      Note:  This document was prepared using Dragon voice recognition software and may include unintentional dictation errors.   Naaman Plummer, MD 10/18/2022 [phone]

## 2022-10-06 ENCOUNTER — Telehealth: Payer: Self-pay | Admitting: Cardiovascular Disease

## 2022-10-06 DIAGNOSIS — J9601 Acute respiratory failure with hypoxia: Secondary | ICD-10-CM | POA: Diagnosis not present

## 2022-10-17 ENCOUNTER — Ambulatory Visit: Payer: Medicare Other | Admitting: Physician Assistant

## 2022-10-19 DIAGNOSIS — I5043 Acute on chronic combined systolic (congestive) and diastolic (congestive) heart failure: Secondary | ICD-10-CM | POA: Diagnosis present

## 2022-10-24 ENCOUNTER — Ambulatory Visit: Payer: Medicare Other | Admitting: Cardiovascular Disease

## 2022-11-02 NOTE — ED Notes (Signed)
Pt off unit to morgue at this time

## 2022-11-02 NOTE — ED Notes (Signed)
Provider notified of pt condition

## 2022-11-02 NOTE — ED Notes (Signed)
Respiratory called to assess pt at this time

## 2022-11-02 NOTE — ED Notes (Signed)
Attending notified of pt status, will come to assess at bedside

## 2022-11-02 NOTE — Telephone Encounter (Signed)
Husband called to report patient passed away last night.  Husband stated the patient thought the world of Dr. Rockey Situ.

## 2022-11-02 NOTE — Death Summary Note (Addendum)
Death Summary  [name] XTG:[id number] DOB: August 05, 1934 DOA: 10-13-22  PCP: Idelle Crouch, MD  Admit date: 2022/10/13 Date of Death: 10/14/22 Time of Death: 2214-12-21 Notification: Idelle Crouch, MD notified of death of 10/14/22  History of present illness:  [name] was a 89 y.o. female with medical history significant of osteoarthritis, bilateral macular degeneration, right breast cancer, CAD, type 2 diabetes, diastolic dysfunction, gastritis, migraine headaches, seasonal allergies, hyperlipidemia, hypertension, hypothyroidism, peripheral neuropathy history of CVA, vertigo, ESRD on hemodialysis who presented to the emergency department with weakness in the setting of diarrheal illness.  She received about 1300 mL of IV fluids and became dyspneic.  She is tachycardic in the low 100s, tachypneic in the 30s, on BiPAP but O2 sat has been 100%.  No chest pain, dizziness, diaphoresis or palpitations.  She is unable to elaborate due to dyspnea.   ED course: Initial vital signs in a.m. were temperature 98.7 F, pulse 87, respiration 20, BP 117/49 mmHg O2 sat 96% on nasal cannula oxygen.  She received morphine 4 mg p.o., magnesium sulfate 2 g IVPB and IVF boluses as earlier mentioned.  Around 1700, the patient became tachycardic, tachypneic and hypoxic.  She was placed on BiPAP.  I ordered an half an inch Nitropaste with albumin, but before these were given she became hypotensive.  Norepinephrine infusion started.  Blood pressure improved.  PCCM consulted.  However, the patient continued to deteriorate despite multiple pressors being given.  Hemodialysis was unable to be provided due to cardiovascular instability.  The patient expired at 12/21/14.  Final Diagnoses:  1.   Principal Problem:   Acute respiratory failure with hypoxia ([place]) In the setting of:   Pulmonary edema Secondary to:   Volume overload Secondary to:   Acute on chronic combined systolic and diastolic CHF (congestive  heart failure) ([place])   Observation/stepdown. Supplemental oxygen as needed. Continue BiPAP ventilation. Will need HD, awaiting for hemodialysis staff. Monitor daily weights, intake and output. Monitor renal function electrolytes. Started on pressors due to hypotension. The patient confirmed again she is DNR/DNI.   Active Problems:   ESRD on hemodialysis ([place]) Will need HD as earlier mentioned.     Hyperlipidemia Currently not on statin.     Hypertension Hold antihypertensives for now. Monitor blood pressure.     GERD (gastroesophageal reflux disease) No longer on famotidine.     CAD (coronary artery disease) Continue clopidogrel 75 mg p.o. daily.     Diabetes mellitus type 2 Carbohydrate modified diet. CBG monitoring before meals and bedtime.     Hypothyroid Continue Synthroid 225 mcg p.o. daily.  The results of significant diagnostics from this hospitalization (including imaging, microbiology, ancillary and laboratory) are listed below for reference.    Significant Diagnostic Studies: DG Chest Portable 1 View  Result Date: 10-13-22 CLINICAL DATA:  Status post central line placement EXAM: PORTABLE CHEST 1 VIEW COMPARISON:  2022-10-13 FINDINGS: Cardiac shadow is enlarged but stable. Postsurgical changes are noted. Left jugular central line is noted with the catheter tip at the junction of the innominate veins. No pneumothorax is seen. Small right effusion and basilar atelectasis is noted new from the previous exam. No other focal abnormality is noted. IMPRESSION: No pneumothorax following central line placement. A new small effusion on the right with basilar atelectasis. Electronically Signed   By: Inez Catalina M.D.   On: 10/13/22 21:52   DG Chest Portable 1 View  Result Date: 13-Oct-2022 CLINICAL DATA:  Shortness of breath EXAM:  PORTABLE CHEST 1 VIEW COMPARISON:  06/08/2021, 08/04/2020. FINDINGS: Post sternotomy changes. Cardiomegaly. Small right-sided effusion. Diffuse  interstitial and ground-glass opacity. No pneumothorax. IMPRESSION: Cardiomegaly with diffuse interstitial and ground-glass opacity, suspect for pulmonary edema. Small right effusion. Electronically Signed   By: Donavan Foil M.D.   On: 10/15/2022 17:19   CT ABDOMEN PELVIS WO CONTRAST  Result Date: 10/21/2022 CLINICAL DATA:  Abdominal pain, acute, nonlocalized. Increasing weakness with diarrhea since yesterday. Hemodialysis patient. EXAM: CT ABDOMEN AND PELVIS WITHOUT CONTRAST TECHNIQUE: Multidetector CT imaging of the abdomen and pelvis was performed following the standard protocol without IV contrast. RADIATION DOSE REDUCTION: This exam was performed according to the departmental dose-optimization program which includes automated exposure control, adjustment of the mA and/or kV according to patient size and/or use of iterative reconstruction technique. COMPARISON:  Radiographs 05/09/2018, renal ultrasound 06/10/2020 and chest CT 05/04/2016. FINDINGS: Lower chest: The heart is enlarged status post median sternotomy. There is aortic and coronary artery atherosclerosis. There are trace pleural effusions with mild scarring at both lung bases. Hepatobiliary: No focal hepatic abnormalities are identified on noncontrast imaging. There is layering high density material within the gallbladder lumen, likely sludge. No evidence of gallbladder wall thickening or significant biliary dilatation. Pancreas: Unremarkable. No pancreatic ductal dilatation or surrounding inflammatory changes. Spleen: Normal in size without focal abnormality. Adrenals/Urinary Tract: Both adrenal glands appear normal. Both kidneys are atrophied with cortical thinning consistent with chronic renal failure. There is a small nonobstructing calculus in the upper pole of the right kidney. No evidence of ureteral calculus or hydronephrosis. The bladder appears unremarkable for its degree of distention. Stomach/Bowel: No enteric contrast administered. The  stomach appears unremarkable for its degree of distension. No evidence of bowel wall thickening, distention or surrounding inflammatory change. Diffuse diverticulosis throughout the descending and sigmoid colon without definite signs of acute diverticulitis. Vascular/Lymphatic: There are no enlarged abdominal or pelvic lymph nodes. Aortic and branch vessel atherosclerosis without evidence of aneurysm. Reproductive: Probable hysterectomy.  No adnexal mass. Other: There is a moderate amount of low-density ascites throughout the peritoneal cavity. No free air or focal extraluminal fluid collection identified. There is generalized soft tissue edema throughout the subcutaneous fat of the lower chest, abdomen and pelvis consistent with anasarca. No hernias are identified. Musculoskeletal: No acute or significant osseous findings. Multilevel spondylosis, most advanced at L4-5. IMPRESSION: 1. Anasarca with diffuse soft tissue edema, trace pleural effusions and moderate ascites. Consider paracentesis if clinical concern for spontaneous bacterial peritonitis. 2. No definite acute abdominopelvic findings. 3. Diffuse distal colonic diverticulosis without definite signs of acute diverticulitis. 4. Bilateral renal atrophy consistent with chronic renal failure. 5. Probable gallbladder sludge without evidence of cholecystitis or biliary dilatation. 6.  Aortic Atherosclerosis (ICD10-I70.0). Electronically Signed   By: Richardean Sale M.D.   On: 10/09/2022 13:08    Microbiology: Recent Results (from the past 240 hour(s))  Resp panel by RT-PCR (RSV, Flu A&B, Covid) Anterior Nasal Swab     Status: None   Collection Time: 10/29/2022 11:03 AM   Specimen: Anterior Nasal Swab  Result Value Ref Range Status   SARS Coronavirus 2 by RT PCR NEGATIVE NEGATIVE Final    Comment: (NOTE) SARS-CoV-2 target nucleic acids are NOT DETECTED.  The SARS-CoV-2 RNA is generally detectable in upper respiratory specimens during the acute phase of  infection. The lowest concentration of SARS-CoV-2 viral copies this assay can detect is 138 copies/mL. A negative result does not preclude SARS-Cov-2 infection and should not be used as the sole  basis for treatment or other patient management decisions. A negative result may occur with  improper specimen collection/handling, submission of specimen other than nasopharyngeal swab, presence of viral mutation(s) within the areas targeted by this assay, and inadequate number of viral copies(<138 copies/mL). A negative result must be combined with clinical observations, patient history, and epidemiological information. The expected result is Negative.  Fact Sheet for Patients:  EntrepreneurPulse.com.au  Fact Sheet for Healthcare Providers:  IncredibleEmployment.be  This test is no t yet approved or cleared by the [place] FDA and  has been authorized for detection and/or diagnosis of SARS-CoV-2 by FDA under an Emergency Use Authorization (EUA). This EUA will remain  in effect (meaning this test can be used) for the duration of the COVID-19 declaration under Section 564(b)(1) of the Act, 21 U.S.C.section 360bbb-3(b)(1), unless the authorization is terminated  or revoked sooner.       Influenza A by PCR NEGATIVE NEGATIVE Final   Influenza B by PCR NEGATIVE NEGATIVE Final    Comment: (NOTE) The Xpert Xpress SARS-CoV-2/FLU/RSV plus assay is intended as an aid in the diagnosis of influenza from Nasopharyngeal swab specimens and should not be used as a sole basis for treatment. Nasal washings and aspirates are unacceptable for Xpert Xpress SARS-CoV-2/FLU/RSV testing.  Fact Sheet for Patients: EntrepreneurPulse.com.au  Fact Sheet for Healthcare Providers: IncredibleEmployment.be  This test is not yet approved or cleared by the [place] FDA and has been authorized for detection and/or diagnosis of SARS-CoV-2  by FDA under an Emergency Use Authorization (EUA). This EUA will remain in effect (meaning this test can be used) for the duration of the COVID-19 declaration under Section 564(b)(1) of the Act, 21 U.S.C. section 360bbb-3(b)(1), unless the authorization is terminated or revoked.     Resp Syncytial Virus by PCR NEGATIVE NEGATIVE Final    Comment: (NOTE) Fact Sheet for Patients: EntrepreneurPulse.com.au  Fact Sheet for Healthcare Providers: IncredibleEmployment.be  This test is not yet approved or cleared by the [place] FDA and has been authorized for detection and/or diagnosis of SARS-CoV-2 by FDA under an Emergency Use Authorization (EUA). This EUA will remain in effect (meaning this test can be used) for the duration of the COVID-19 declaration under Section 564(b)(1) of the Act, 21 U.S.C. section 360bbb-3(b)(1), unless the authorization is terminated or revoked.  Performed at [facility], [address] [place], [place] [address]      Labs: Basic Metabolic Panel: Recent Labs  Lab 10/13/2022 1102  NA 137  K 4.6  CL 96*  CO2 27  GLUCOSE 97  BUN 50*  CREATININE 4.94*  CALCIUM 7.5*  MG 1.5*   Liver Function Tests: Recent Labs  Lab 10/13/2022 1102  AST 18  ALT 11  ALKPHOS 80  BILITOT 0.8  PROT 7.9  ALBUMIN 2.8*   No results for input(s): "LIPASE", "AMYLASE" in the last 168 hours. No results for input(s): "AMMONIA" in the last 168 hours. CBC: Recent Labs  Lab 10/10/2022 1102  WBC 8.0  NEUTROABS 7.6  HGB 10.3*  HCT 34.0*  MCV 98.8  PLT 153   Cardiac Enzymes: No results for input(s): "CKTOTAL", "CKMB", "CKMBINDEX", "TROPONINI" in the last 168 hours. D-Dimer No results for input(s): "DDIMER" in the last 72 hours. BNP: Invalid input(s): "POCBNP" CBG: Recent Labs  Lab 10/18/2022 1951  GLUCAP 51*   Anemia work up No results for input(s): "VITAMINB12", "FOLATE", "FERRITIN", "TIBC", "IRON", "RETICCTPCT"  in the last 72 hours. Urinalysis    Component Value Date/Time  COLORURINE RED (A) 06/23/2020 1025   APPEARANCEUR HAZY (A) 06/23/2020 1025   APPEARANCEUR Clear 05/03/2014 0503   LABSPEC 1.007 06/23/2020 1025   LABSPEC 1.015 05/03/2014 0503   PHURINE  06/23/2020 1025    TEST NOT REPORTED DUE TO COLOR INTERFERENCE OF URINE PIGMENT   GLUCOSEU (A) 06/23/2020 1025    TEST NOT REPORTED DUE TO COLOR INTERFERENCE OF URINE PIGMENT   GLUCOSEU Negative 05/03/2014 0503   HGBUR (A) 06/23/2020 1025    TEST NOT REPORTED DUE TO COLOR INTERFERENCE OF URINE PIGMENT   BILIRUBINUR (A) 06/23/2020 1025    TEST NOT REPORTED DUE TO COLOR INTERFERENCE OF URINE PIGMENT   BILIRUBINUR Negative 05/03/2014 0503   KETONESUR (A) 06/23/2020 1025    TEST NOT REPORTED DUE TO COLOR INTERFERENCE OF URINE PIGMENT   PROTEINUR (A) 06/23/2020 1025    TEST NOT REPORTED DUE TO COLOR INTERFERENCE OF URINE PIGMENT   UROBILINOGEN 0.2 09/03/2012 1355   NITRITE (A) 06/23/2020 1025    TEST NOT REPORTED DUE TO COLOR INTERFERENCE OF URINE PIGMENT   LEUKOCYTESUR (A) 06/23/2020 1025    TEST NOT REPORTED DUE TO COLOR INTERFERENCE OF URINE PIGMENT   LEUKOCYTESUR Negative 05/03/2014 0503   Sepsis Labs Recent Labs  Lab 10/30/2022 1102  WBC 8.0    Echocardiogram 04/2022 IMPRESSIONS:    1. Left ventricular ejection fraction, by estimation, is 30 to 35%. The  left ventricle has moderately decreased function. The left ventricle  demonstrates global hypokinesis. Left ventricular diastolic parameters are  consistent with Grade II diastolic  dysfunction (pseudonormalization).   2. Right ventricular systolic function is normal. The right ventricular  size is normal. There is normal pulmonary artery systolic pressure. The  estimated right ventricular systolic pressure is 00.9 mmHg.   3. Left atrial size was severely dilated.   4. The mitral valve is normal in structure. Moderate mitral valve  regurgitation. No evidence of mitral  stenosis.   5. Tricuspid valve regurgitation is moderate to severe.   6. The aortic valve is normal in structure. Aortic valve regurgitation is  moderate. Aortic valve sclerosis/calcification is present, without any  evidence of aortic stenosis.   7. The inferior vena cava is dilated in size with >50% respiratory  variability, suggesting right atrial pressure of 8 mmHg.    SIGNED:  Reubin Milan, MD  [facility] 10/15/22, 8:02 AM Pager   If 7PM-7AM, please contact night-coverage [email or web address] Password [email or web address]  This document was prepared using Systems analyst and may contain some unintended transcription errors.

## 2022-11-02 NOTE — ED Notes (Signed)
Respiratory paged to assess pt

## 2022-11-02 DEATH — deceased

## 2023-02-20 ENCOUNTER — Encounter (INDEPENDENT_AMBULATORY_CARE_PROVIDER_SITE_OTHER): Payer: Medicare Other

## 2023-02-20 ENCOUNTER — Ambulatory Visit (INDEPENDENT_AMBULATORY_CARE_PROVIDER_SITE_OTHER): Payer: Medicare Other | Admitting: Vascular Surgery
